# Patient Record
Sex: Female | Born: 1975
Health system: Southern US, Community
[De-identification: ages and names within clinical notes are randomized; demographics above are authoritative.]

## PROBLEM LIST (undated history)

## (undated) ENCOUNTER — Inpatient Hospital Stay (HOSPITAL_COMMUNITY): Payer: Self-pay

## (undated) DIAGNOSIS — F329 Major depressive disorder, single episode, unspecified: Secondary | ICD-10-CM

## (undated) DIAGNOSIS — E119 Type 2 diabetes mellitus without complications: Secondary | ICD-10-CM

## (undated) DIAGNOSIS — O34219 Maternal care for unspecified type scar from previous cesarean delivery: Secondary | ICD-10-CM

## (undated) DIAGNOSIS — F32A Depression, unspecified: Secondary | ICD-10-CM

## (undated) DIAGNOSIS — R0602 Shortness of breath: Secondary | ICD-10-CM

## (undated) DIAGNOSIS — E7800 Pure hypercholesterolemia, unspecified: Secondary | ICD-10-CM

## (undated) DIAGNOSIS — M1990 Unspecified osteoarthritis, unspecified site: Secondary | ICD-10-CM

## (undated) DIAGNOSIS — N946 Dysmenorrhea, unspecified: Secondary | ICD-10-CM

## (undated) DIAGNOSIS — F419 Anxiety disorder, unspecified: Secondary | ICD-10-CM

## (undated) DIAGNOSIS — N912 Amenorrhea, unspecified: Secondary | ICD-10-CM

## (undated) DIAGNOSIS — G4730 Sleep apnea, unspecified: Secondary | ICD-10-CM

## (undated) DIAGNOSIS — M549 Dorsalgia, unspecified: Secondary | ICD-10-CM

## (undated) DIAGNOSIS — K200 Eosinophilic esophagitis: Secondary | ICD-10-CM

## (undated) DIAGNOSIS — D649 Anemia, unspecified: Secondary | ICD-10-CM

## (undated) DIAGNOSIS — D72829 Elevated white blood cell count, unspecified: Secondary | ICD-10-CM

## (undated) DIAGNOSIS — I499 Cardiac arrhythmia, unspecified: Secondary | ICD-10-CM

## (undated) DIAGNOSIS — A419 Sepsis, unspecified organism: Secondary | ICD-10-CM

## (undated) DIAGNOSIS — K219 Gastro-esophageal reflux disease without esophagitis: Secondary | ICD-10-CM

## (undated) DIAGNOSIS — G709 Myoneural disorder, unspecified: Secondary | ICD-10-CM

## (undated) DIAGNOSIS — K5900 Constipation, unspecified: Secondary | ICD-10-CM

## (undated) DIAGNOSIS — K589 Irritable bowel syndrome without diarrhea: Secondary | ICD-10-CM

## (undated) DIAGNOSIS — R600 Localized edema: Secondary | ICD-10-CM

## (undated) DIAGNOSIS — T7840XA Allergy, unspecified, initial encounter: Secondary | ICD-10-CM

## (undated) DIAGNOSIS — K829 Disease of gallbladder, unspecified: Secondary | ICD-10-CM

## (undated) DIAGNOSIS — O009 Unspecified ectopic pregnancy without intrauterine pregnancy: Secondary | ICD-10-CM

## (undated) DIAGNOSIS — T884XXA Failed or difficult intubation, initial encounter: Secondary | ICD-10-CM

## (undated) DIAGNOSIS — F250 Schizoaffective disorder, bipolar type: Secondary | ICD-10-CM

## (undated) DIAGNOSIS — J449 Chronic obstructive pulmonary disease, unspecified: Secondary | ICD-10-CM

## (undated) DIAGNOSIS — E669 Obesity, unspecified: Secondary | ICD-10-CM

## (undated) HISTORY — DX: Localized edema: R60.0

## (undated) HISTORY — DX: Dorsalgia, unspecified: M54.9

## (undated) HISTORY — DX: Eosinophilic esophagitis: K20.0

## (undated) HISTORY — DX: Irritable bowel syndrome, unspecified: K58.9

## (undated) HISTORY — DX: Disease of gallbladder, unspecified: K82.9

## (undated) HISTORY — DX: Elevated white blood cell count, unspecified: D72.829

## (undated) HISTORY — DX: Unspecified osteoarthritis, unspecified site: M19.90

## (undated) HISTORY — PX: UPPER GASTROINTESTINAL ENDOSCOPY: SHX188

## (undated) HISTORY — DX: Failed or difficult intubation, initial encounter: T88.4XXA

## (undated) HISTORY — DX: Maternal care for unspecified type scar from previous cesarean delivery: O34.219

## (undated) HISTORY — PX: CHOLECYSTECTOMY: SHX55

## (undated) HISTORY — DX: Obesity, unspecified: E66.9

## (undated) HISTORY — PX: TONSILLECTOMY: SUR1361

## (undated) HISTORY — DX: Allergy, unspecified, initial encounter: T78.40XA

## (undated) HISTORY — DX: Constipation, unspecified: K59.00

## (undated) HISTORY — DX: Anxiety disorder, unspecified: F41.9

## (undated) HISTORY — PX: DENTAL SURGERY: SHX609

## (undated) HISTORY — DX: Anemia, unspecified: D64.9

## (undated) HISTORY — DX: Pure hypercholesterolemia, unspecified: E78.00

## (undated) HISTORY — DX: Unspecified ectopic pregnancy without intrauterine pregnancy: O00.90

---

## 2003-07-21 ENCOUNTER — Encounter: Payer: Self-pay | Admitting: Emergency Medicine

## 2003-07-22 ENCOUNTER — Emergency Department (HOSPITAL_COMMUNITY): Admission: EM | Admit: 2003-07-22 | Discharge: 2003-07-22 | Payer: Self-pay | Admitting: Emergency Medicine

## 2003-10-17 ENCOUNTER — Ambulatory Visit (HOSPITAL_COMMUNITY): Admission: RE | Admit: 2003-10-17 | Discharge: 2003-10-17 | Payer: Self-pay | Admitting: General Surgery

## 2004-01-10 ENCOUNTER — Ambulatory Visit (HOSPITAL_COMMUNITY): Admission: RE | Admit: 2004-01-10 | Discharge: 2004-01-10 | Payer: Self-pay | Admitting: General Surgery

## 2004-02-06 ENCOUNTER — Inpatient Hospital Stay: Payer: Self-pay | Admitting: Unknown Physician Specialty

## 2004-02-18 ENCOUNTER — Ambulatory Visit: Admission: RE | Admit: 2004-02-18 | Discharge: 2004-02-18 | Payer: Self-pay | Admitting: General Surgery

## 2004-02-18 ENCOUNTER — Ambulatory Visit: Payer: Self-pay | Admitting: Pulmonary Disease

## 2004-04-30 ENCOUNTER — Emergency Department (HOSPITAL_COMMUNITY): Admission: EM | Admit: 2004-04-30 | Discharge: 2004-04-30 | Payer: Self-pay | Admitting: Emergency Medicine

## 2004-06-02 ENCOUNTER — Emergency Department (HOSPITAL_COMMUNITY): Admission: EM | Admit: 2004-06-02 | Discharge: 2004-06-02 | Payer: Self-pay | Admitting: Emergency Medicine

## 2004-08-09 ENCOUNTER — Ambulatory Visit: Payer: Self-pay | Admitting: Family Medicine

## 2004-08-16 ENCOUNTER — Ambulatory Visit (HOSPITAL_COMMUNITY): Admission: RE | Admit: 2004-08-16 | Discharge: 2004-08-16 | Payer: Self-pay | Admitting: Family Medicine

## 2004-09-10 ENCOUNTER — Ambulatory Visit: Payer: Self-pay | Admitting: Family Medicine

## 2004-11-02 ENCOUNTER — Ambulatory Visit: Payer: Self-pay | Admitting: Psychiatry

## 2004-11-02 ENCOUNTER — Inpatient Hospital Stay (HOSPITAL_COMMUNITY): Admission: RE | Admit: 2004-11-02 | Discharge: 2004-11-05 | Payer: Self-pay | Admitting: Psychiatry

## 2004-12-29 ENCOUNTER — Emergency Department (HOSPITAL_COMMUNITY): Admission: EM | Admit: 2004-12-29 | Discharge: 2004-12-29 | Payer: Self-pay | Admitting: Emergency Medicine

## 2005-01-04 ENCOUNTER — Emergency Department (HOSPITAL_COMMUNITY): Admission: EM | Admit: 2005-01-04 | Discharge: 2005-01-04 | Payer: Self-pay | Admitting: Emergency Medicine

## 2005-08-06 HISTORY — PX: ESOPHAGOGASTRODUODENOSCOPY: SHX1529

## 2005-08-08 ENCOUNTER — Ambulatory Visit: Payer: Self-pay | Admitting: Internal Medicine

## 2005-08-09 ENCOUNTER — Ambulatory Visit (HOSPITAL_COMMUNITY): Admission: RE | Admit: 2005-08-09 | Discharge: 2005-08-09 | Payer: Self-pay | Admitting: Internal Medicine

## 2005-08-16 ENCOUNTER — Ambulatory Visit: Payer: Self-pay | Admitting: Internal Medicine

## 2005-08-16 ENCOUNTER — Ambulatory Visit (HOSPITAL_COMMUNITY): Admission: RE | Admit: 2005-08-16 | Discharge: 2005-08-16 | Payer: Self-pay | Admitting: Internal Medicine

## 2005-08-30 ENCOUNTER — Ambulatory Visit: Payer: Self-pay | Admitting: Internal Medicine

## 2005-11-06 ENCOUNTER — Encounter (INDEPENDENT_AMBULATORY_CARE_PROVIDER_SITE_OTHER): Payer: Self-pay | Admitting: Family Medicine

## 2005-11-06 ENCOUNTER — Emergency Department (HOSPITAL_COMMUNITY): Admission: EM | Admit: 2005-11-06 | Discharge: 2005-11-06 | Payer: Self-pay | Admitting: Emergency Medicine

## 2005-11-06 LAB — CONVERTED CEMR LAB: Pap Smear: NORMAL

## 2006-02-20 ENCOUNTER — Emergency Department (HOSPITAL_COMMUNITY): Admission: EM | Admit: 2006-02-20 | Discharge: 2006-02-20 | Payer: Self-pay | Admitting: Emergency Medicine

## 2006-04-18 ENCOUNTER — Emergency Department (HOSPITAL_COMMUNITY): Admission: EM | Admit: 2006-04-18 | Discharge: 2006-04-19 | Payer: Self-pay | Admitting: Emergency Medicine

## 2006-04-28 ENCOUNTER — Ambulatory Visit: Payer: Self-pay | Admitting: Family Medicine

## 2006-05-06 ENCOUNTER — Encounter (INDEPENDENT_AMBULATORY_CARE_PROVIDER_SITE_OTHER): Payer: Self-pay | Admitting: Family Medicine

## 2006-05-06 LAB — CONVERTED CEMR LAB
ALT: 32 units/L (ref 0–35)
AST: 23 units/L (ref 0–37)
Albumin: 4 g/dL (ref 3.5–5.2)
Alkaline Phosphatase: 61 units/L (ref 39–117)
BUN: 6 mg/dL (ref 6–23)
Basophils Absolute: 0 10*3/uL (ref 0.0–0.1)
Basophils Relative: 0 % (ref 0–1)
CO2: 22 meq/L (ref 19–32)
Calcium: 9.4 mg/dL (ref 8.4–10.5)
Chloride: 109 meq/L (ref 96–112)
Cholesterol: 118 mg/dL (ref 0–200)
Creatinine, Ser: 0.67 mg/dL (ref 0.40–1.20)
Eosinophils Absolute: 0.5 10*3/uL (ref 0.0–0.7)
Eosinophils Relative: 5 % (ref 0–5)
Glucose, Bld: 94 mg/dL (ref 70–99)
HCT: 43.7 % (ref 36.0–46.0)
HDL: 29 mg/dL — ABNORMAL LOW (ref >39)
Hemoglobin: 13.4 g/dL (ref 12.0–15.0)
LDL Cholesterol: 49 mg/dL (ref 0–99)
Lithium Lvl: 0.53 meq/L — ABNORMAL LOW (ref 0.80–1.40)
Lymphocytes Relative: 28 % (ref 12–46)
Lymphs Abs: 2.7 10*3/uL (ref 0.7–3.3)
MCHC: 30.7 g/dL (ref 30.0–36.0)
MCV: 88.6 fL (ref 78.0–100.0)
Monocytes Absolute: 0.6 10*3/uL (ref 0.2–0.7)
Monocytes Relative: 6 % (ref 3–11)
Neutro Abs: 6 10*3/uL (ref 1.7–7.7)
Neutrophils Relative %: 61 % (ref 43–77)
Platelets: 339 10*3/uL (ref 150–400)
Potassium: 4.4 meq/L (ref 3.5–5.3)
RBC: 4.93 M/uL (ref 3.87–5.11)
RDW: 14.9 % — ABNORMAL HIGH (ref 11.5–14.0)
Sodium: 141 meq/L (ref 135–145)
TSH: 2.567 microintl units/mL (ref 0.350–5.50)
Total Bilirubin: 0.3 mg/dL (ref 0.3–1.2)
Total CHOL/HDL Ratio: 4.1
Total Protein: 7.2 g/dL (ref 6.0–8.3)
Triglycerides: 199 mg/dL — ABNORMAL HIGH (ref <150)
VLDL: 40 mg/dL (ref 0–40)
WBC: 9.8 10*3/uL (ref 4.0–10.5)

## 2006-05-12 ENCOUNTER — Encounter: Payer: Self-pay | Admitting: Family Medicine

## 2006-05-12 DIAGNOSIS — I10 Essential (primary) hypertension: Secondary | ICD-10-CM

## 2006-05-12 DIAGNOSIS — E785 Hyperlipidemia, unspecified: Secondary | ICD-10-CM | POA: Insufficient documentation

## 2006-05-12 DIAGNOSIS — K219 Gastro-esophageal reflux disease without esophagitis: Secondary | ICD-10-CM | POA: Insufficient documentation

## 2006-05-12 DIAGNOSIS — J45909 Unspecified asthma, uncomplicated: Secondary | ICD-10-CM | POA: Insufficient documentation

## 2006-05-12 DIAGNOSIS — M1990 Unspecified osteoarthritis, unspecified site: Secondary | ICD-10-CM | POA: Insufficient documentation

## 2006-05-13 ENCOUNTER — Ambulatory Visit: Payer: Self-pay | Admitting: Family Medicine

## 2006-05-13 DIAGNOSIS — K644 Residual hemorrhoidal skin tags: Secondary | ICD-10-CM | POA: Insufficient documentation

## 2006-05-13 DIAGNOSIS — E8881 Metabolic syndrome: Secondary | ICD-10-CM

## 2006-05-13 LAB — CONVERTED CEMR LAB
Cholesterol, target level: 200 mg/dL
HDL goal, serum: 40 mg/dL
LDL Goal: 130 mg/dL

## 2006-05-14 ENCOUNTER — Telehealth (INDEPENDENT_AMBULATORY_CARE_PROVIDER_SITE_OTHER): Payer: Self-pay | Admitting: Family Medicine

## 2006-06-02 ENCOUNTER — Encounter (INDEPENDENT_AMBULATORY_CARE_PROVIDER_SITE_OTHER): Payer: Self-pay | Admitting: Family Medicine

## 2006-06-02 ENCOUNTER — Ambulatory Visit: Admission: RE | Admit: 2006-06-02 | Discharge: 2006-06-02 | Payer: Self-pay | Admitting: Family Medicine

## 2006-06-10 ENCOUNTER — Ambulatory Visit: Payer: Self-pay | Admitting: Family Medicine

## 2006-06-10 ENCOUNTER — Telehealth (INDEPENDENT_AMBULATORY_CARE_PROVIDER_SITE_OTHER): Payer: Self-pay | Admitting: Family Medicine

## 2006-06-10 LAB — CONVERTED CEMR LAB: Hgb A1c MFr Bld: 4.9 %

## 2006-06-11 ENCOUNTER — Ambulatory Visit (HOSPITAL_COMMUNITY): Admission: RE | Admit: 2006-06-11 | Discharge: 2006-06-11 | Payer: Self-pay | Admitting: Family Medicine

## 2006-06-11 ENCOUNTER — Encounter (INDEPENDENT_AMBULATORY_CARE_PROVIDER_SITE_OTHER): Payer: Self-pay | Admitting: Family Medicine

## 2006-06-11 LAB — CONVERTED CEMR LAB: Ferritin: 43 ng/mL (ref 10–291)

## 2006-06-12 ENCOUNTER — Ambulatory Visit: Payer: Self-pay | Admitting: Pulmonary Disease

## 2006-06-13 ENCOUNTER — Encounter (INDEPENDENT_AMBULATORY_CARE_PROVIDER_SITE_OTHER): Payer: Self-pay | Admitting: Family Medicine

## 2006-06-16 ENCOUNTER — Encounter (INDEPENDENT_AMBULATORY_CARE_PROVIDER_SITE_OTHER): Payer: Self-pay | Admitting: Family Medicine

## 2006-06-19 ENCOUNTER — Telehealth (INDEPENDENT_AMBULATORY_CARE_PROVIDER_SITE_OTHER): Payer: Self-pay | Admitting: Family Medicine

## 2006-06-24 ENCOUNTER — Ambulatory Visit: Payer: Self-pay | Admitting: Family Medicine

## 2006-06-25 ENCOUNTER — Encounter (INDEPENDENT_AMBULATORY_CARE_PROVIDER_SITE_OTHER): Payer: Self-pay | Admitting: Family Medicine

## 2006-06-26 ENCOUNTER — Encounter (INDEPENDENT_AMBULATORY_CARE_PROVIDER_SITE_OTHER): Payer: Self-pay | Admitting: Family Medicine

## 2006-07-10 ENCOUNTER — Encounter (INDEPENDENT_AMBULATORY_CARE_PROVIDER_SITE_OTHER): Payer: Self-pay | Admitting: Family Medicine

## 2006-07-15 ENCOUNTER — Encounter (INDEPENDENT_AMBULATORY_CARE_PROVIDER_SITE_OTHER): Payer: Self-pay | Admitting: Family Medicine

## 2006-08-11 ENCOUNTER — Encounter (INDEPENDENT_AMBULATORY_CARE_PROVIDER_SITE_OTHER): Payer: Self-pay | Admitting: Family Medicine

## 2006-08-13 ENCOUNTER — Telehealth (INDEPENDENT_AMBULATORY_CARE_PROVIDER_SITE_OTHER): Payer: Self-pay | Admitting: Family Medicine

## 2006-08-26 ENCOUNTER — Encounter (INDEPENDENT_AMBULATORY_CARE_PROVIDER_SITE_OTHER): Payer: Self-pay | Admitting: Family Medicine

## 2006-09-16 ENCOUNTER — Ambulatory Visit: Payer: Self-pay | Admitting: Family Medicine

## 2006-09-17 ENCOUNTER — Encounter (INDEPENDENT_AMBULATORY_CARE_PROVIDER_SITE_OTHER): Payer: Self-pay | Admitting: Family Medicine

## 2006-09-25 ENCOUNTER — Encounter (INDEPENDENT_AMBULATORY_CARE_PROVIDER_SITE_OTHER): Payer: Self-pay | Admitting: Family Medicine

## 2006-09-25 ENCOUNTER — Ambulatory Visit (HOSPITAL_COMMUNITY): Admission: RE | Admit: 2006-09-25 | Discharge: 2006-09-25 | Payer: Self-pay | Admitting: Family Medicine

## 2006-10-14 ENCOUNTER — Ambulatory Visit: Payer: Self-pay | Admitting: Family Medicine

## 2006-10-14 ENCOUNTER — Telehealth (INDEPENDENT_AMBULATORY_CARE_PROVIDER_SITE_OTHER): Payer: Self-pay | Admitting: *Deleted

## 2006-10-15 ENCOUNTER — Encounter (INDEPENDENT_AMBULATORY_CARE_PROVIDER_SITE_OTHER): Payer: Self-pay | Admitting: Family Medicine

## 2006-10-28 ENCOUNTER — Emergency Department (HOSPITAL_COMMUNITY): Admission: EM | Admit: 2006-10-28 | Discharge: 2006-10-28 | Payer: Self-pay | Admitting: Emergency Medicine

## 2006-10-28 ENCOUNTER — Telehealth (INDEPENDENT_AMBULATORY_CARE_PROVIDER_SITE_OTHER): Payer: Self-pay | Admitting: Family Medicine

## 2006-10-30 ENCOUNTER — Encounter (INDEPENDENT_AMBULATORY_CARE_PROVIDER_SITE_OTHER): Payer: Self-pay | Admitting: Family Medicine

## 2006-11-07 ENCOUNTER — Encounter (INDEPENDENT_AMBULATORY_CARE_PROVIDER_SITE_OTHER): Payer: Self-pay | Admitting: Family Medicine

## 2006-11-07 LAB — CONVERTED CEMR LAB: Pap Smear: NORMAL

## 2006-11-25 ENCOUNTER — Ambulatory Visit: Payer: Self-pay | Admitting: Family Medicine

## 2006-12-10 ENCOUNTER — Ambulatory Visit: Payer: Self-pay | Admitting: Family Medicine

## 2007-01-07 ENCOUNTER — Ambulatory Visit: Payer: Self-pay | Admitting: Family Medicine

## 2007-02-18 ENCOUNTER — Ambulatory Visit: Payer: Self-pay | Admitting: Family Medicine

## 2007-02-18 ENCOUNTER — Telehealth (INDEPENDENT_AMBULATORY_CARE_PROVIDER_SITE_OTHER): Payer: Self-pay | Admitting: *Deleted

## 2007-02-24 ENCOUNTER — Emergency Department (HOSPITAL_COMMUNITY): Admission: EM | Admit: 2007-02-24 | Discharge: 2007-02-24 | Payer: Self-pay | Admitting: Emergency Medicine

## 2007-04-15 ENCOUNTER — Ambulatory Visit: Payer: Self-pay | Admitting: Family Medicine

## 2007-05-27 ENCOUNTER — Ambulatory Visit: Payer: Self-pay | Admitting: Family Medicine

## 2007-06-24 ENCOUNTER — Emergency Department (HOSPITAL_COMMUNITY): Admission: EM | Admit: 2007-06-24 | Discharge: 2007-06-24 | Payer: Self-pay | Admitting: Emergency Medicine

## 2007-07-08 ENCOUNTER — Ambulatory Visit: Payer: Self-pay | Admitting: Family Medicine

## 2007-07-09 ENCOUNTER — Telehealth (INDEPENDENT_AMBULATORY_CARE_PROVIDER_SITE_OTHER): Payer: Self-pay | Admitting: *Deleted

## 2007-07-09 LAB — CONVERTED CEMR LAB
ALT: 31 units/L (ref 0–35)
AST: 27 units/L (ref 0–37)
Albumin: 4.4 g/dL (ref 3.5–5.2)
Alkaline Phosphatase: 68 units/L (ref 39–117)
BUN: 7 mg/dL (ref 6–23)
Basophils Absolute: 0 10*3/uL (ref 0.0–0.1)
Basophils Relative: 0 % (ref 0–1)
CO2: 20 meq/L (ref 19–32)
Calcium: 9.4 mg/dL (ref 8.4–10.5)
Chloride: 106 meq/L (ref 96–112)
Cholesterol: 131 mg/dL (ref 0–200)
Creatinine, Ser: 0.57 mg/dL (ref 0.40–1.20)
Eosinophils Absolute: 0.5 10*3/uL (ref 0.0–0.7)
Eosinophils Relative: 4 % (ref 0–5)
Glucose, Bld: 80 mg/dL (ref 70–99)
HCT: 45.3 % (ref 36.0–46.0)
HDL: 33 mg/dL — ABNORMAL LOW (ref >39)
Hemoglobin: 14.2 g/dL (ref 12.0–15.0)
LDL Cholesterol: 51 mg/dL (ref 0–99)
Lymphocytes Relative: 30 % (ref 12–46)
Lymphs Abs: 3.4 10*3/uL (ref 0.7–4.0)
MCHC: 31.3 g/dL (ref 30.0–36.0)
MCV: 89.9 fL (ref 78.0–100.0)
Monocytes Absolute: 0.5 10*3/uL (ref 0.1–1.0)
Monocytes Relative: 4 % (ref 3–12)
Neutro Abs: 6.9 10*3/uL (ref 1.7–7.7)
Neutrophils Relative %: 61 % (ref 43–77)
Platelets: 395 10*3/uL (ref 150–400)
Potassium: 5.1 meq/L (ref 3.5–5.3)
RBC: 5.04 M/uL (ref 3.87–5.11)
RDW: 14.6 % (ref 11.5–15.5)
Sodium: 140 meq/L (ref 135–145)
TSH: 3.56 microintl units/mL (ref 0.350–5.50)
Total Bilirubin: 0.3 mg/dL (ref 0.3–1.2)
Total CHOL/HDL Ratio: 4
Total Protein: 7.8 g/dL (ref 6.0–8.3)
Triglycerides: 236 mg/dL — ABNORMAL HIGH (ref <150)
VLDL: 47 mg/dL — ABNORMAL HIGH (ref 0–40)
WBC: 11.3 10*3/uL — ABNORMAL HIGH (ref 4.0–10.5)

## 2007-08-20 ENCOUNTER — Ambulatory Visit: Payer: Self-pay | Admitting: Family Medicine

## 2007-08-20 DIAGNOSIS — J301 Allergic rhinitis due to pollen: Secondary | ICD-10-CM | POA: Insufficient documentation

## 2007-10-01 ENCOUNTER — Ambulatory Visit: Payer: Self-pay | Admitting: Family Medicine

## 2007-11-11 ENCOUNTER — Ambulatory Visit: Payer: Self-pay | Admitting: Family Medicine

## 2007-12-10 ENCOUNTER — Ambulatory Visit: Payer: Self-pay | Admitting: Family Medicine

## 2008-01-21 ENCOUNTER — Ambulatory Visit: Payer: Self-pay | Admitting: Family Medicine

## 2008-01-22 LAB — CONVERTED CEMR LAB
ALT: 25 units/L (ref 0–35)
AST: 18 units/L (ref 0–37)
Albumin: 5.1 g/dL (ref 3.5–5.2)
Alkaline Phosphatase: 72 units/L (ref 39–117)
BUN: 13 mg/dL (ref 6–23)
CO2: 19 meq/L (ref 19–32)
Calcium: 10.5 mg/dL (ref 8.4–10.5)
Chloride: 102 meq/L (ref 96–112)
Cholesterol: 164 mg/dL (ref 0–200)
Creatinine, Ser: 0.67 mg/dL (ref 0.40–1.20)
Glucose, Bld: 74 mg/dL (ref 70–99)
HDL: 40 mg/dL (ref >39)
LDL Cholesterol: 51 mg/dL (ref 0–99)
Potassium: 4.6 meq/L (ref 3.5–5.3)
Sodium: 137 meq/L (ref 135–145)
Total Bilirubin: 0.3 mg/dL (ref 0.3–1.2)
Total CHOL/HDL Ratio: 4.1
Total Protein: 9.1 g/dL — ABNORMAL HIGH (ref 6.0–8.3)
Triglycerides: 367 mg/dL — ABNORMAL HIGH (ref <150)
VLDL: 73 mg/dL — ABNORMAL HIGH (ref 0–40)

## 2008-03-08 ENCOUNTER — Ambulatory Visit: Payer: Self-pay | Admitting: Family Medicine

## 2008-03-08 LAB — CONVERTED CEMR LAB

## 2008-03-09 ENCOUNTER — Encounter (INDEPENDENT_AMBULATORY_CARE_PROVIDER_SITE_OTHER): Payer: Self-pay | Admitting: Family Medicine

## 2008-04-19 ENCOUNTER — Ambulatory Visit: Payer: Self-pay | Admitting: Family Medicine

## 2008-04-19 LAB — CONVERTED CEMR LAB
Bilirubin Urine: NEGATIVE
Glucose, Urine, Semiquant: NEGATIVE
Ketones, urine, test strip: NEGATIVE
Nitrite: NEGATIVE
Protein, U semiquant: 100
Specific Gravity, Urine: 1.02
Urobilinogen, UA: 0.2
pH: 6

## 2008-04-20 ENCOUNTER — Encounter (INDEPENDENT_AMBULATORY_CARE_PROVIDER_SITE_OTHER): Payer: Self-pay | Admitting: Family Medicine

## 2008-04-20 LAB — CONVERTED CEMR LAB
ALT: 24 U/L
AST: 20 U/L
Albumin: 4.4 g/dL
Alkaline Phosphatase: 60 U/L
BUN: 8 mg/dL
CO2: 18 meq/L — ABNORMAL LOW
Calcium: 9.6 mg/dL
Chloride: 102 meq/L
Creatinine, Ser: 0.67 mg/dL
Glucose, Bld: 74 mg/dL
Potassium: 4.4 meq/L
Sodium: 138 meq/L
Total Bilirubin: 0.5 mg/dL
Total Protein: 8.2 g/dL

## 2008-05-17 ENCOUNTER — Ambulatory Visit: Payer: Self-pay | Admitting: Family Medicine

## 2008-06-02 ENCOUNTER — Ambulatory Visit: Payer: Self-pay | Admitting: Family Medicine

## 2008-06-02 LAB — CONVERTED CEMR LAB
LDL Goal: 160 mg/dL
Rapid Strep: NEGATIVE

## 2008-06-30 ENCOUNTER — Ambulatory Visit: Payer: Self-pay | Admitting: Family Medicine

## 2008-07-01 LAB — CONVERTED CEMR LAB
ALT: 35 units/L (ref 0–35)
AST: 28 units/L (ref 0–37)
Albumin: 4.5 g/dL (ref 3.5–5.2)
Alkaline Phosphatase: 59 units/L (ref 39–117)
BUN: 7 mg/dL (ref 6–23)
Basophils Absolute: 0.1 10*3/uL (ref 0.0–0.1)
Basophils Relative: 0 % (ref 0–1)
CO2: 22 meq/L (ref 19–32)
Calcium: 9.4 mg/dL (ref 8.4–10.5)
Chloride: 104 meq/L (ref 96–112)
Cholesterol: 142 mg/dL (ref 0–200)
Creatinine, Ser: 0.63 mg/dL (ref 0.40–1.20)
Eosinophils Absolute: 0.5 10*3/uL (ref 0.0–0.7)
Eosinophils Relative: 4 % (ref 0–5)
Glucose, Bld: 84 mg/dL (ref 70–99)
HCT: 44.4 % (ref 36.0–46.0)
HDL: 36 mg/dL — ABNORMAL LOW (ref >39)
Hemoglobin: 13.8 g/dL (ref 12.0–15.0)
LDL Cholesterol: 54 mg/dL (ref 0–99)
Lithium Lvl: 0.47 meq/L — ABNORMAL LOW (ref 0.80–1.40)
Lymphocytes Relative: 28 % (ref 12–46)
Lymphs Abs: 3.7 10*3/uL (ref 0.7–4.0)
MCHC: 31.1 g/dL (ref 30.0–36.0)
MCV: 87.4 fL (ref 78.0–100.0)
Monocytes Absolute: 0.6 10*3/uL (ref 0.1–1.0)
Monocytes Relative: 4 % (ref 3–12)
Neutro Abs: 8.4 10*3/uL — ABNORMAL HIGH (ref 1.7–7.7)
Neutrophils Relative %: 64 % (ref 43–77)
Platelets: 421 10*3/uL — ABNORMAL HIGH (ref 150–400)
Potassium: 4.5 meq/L (ref 3.5–5.3)
RBC: 5.08 M/uL (ref 3.87–5.11)
RDW: 14.8 % (ref 11.5–15.5)
Sodium: 140 meq/L (ref 135–145)
TSH: 2.328 microintl units/mL (ref 0.350–4.500)
Total Bilirubin: 0.5 mg/dL (ref 0.3–1.2)
Total CHOL/HDL Ratio: 3.9
Total Protein: 8.2 g/dL (ref 6.0–8.3)
Triglycerides: 259 mg/dL — ABNORMAL HIGH (ref <150)
VLDL: 52 mg/dL — ABNORMAL HIGH (ref 0–40)
WBC: 13.2 10*3/uL — ABNORMAL HIGH (ref 4.0–10.5)

## 2008-07-04 ENCOUNTER — Encounter (INDEPENDENT_AMBULATORY_CARE_PROVIDER_SITE_OTHER): Payer: Self-pay | Admitting: Family Medicine

## 2008-07-28 ENCOUNTER — Ambulatory Visit: Payer: Self-pay | Admitting: Family Medicine

## 2008-07-28 DIAGNOSIS — D72829 Elevated white blood cell count, unspecified: Secondary | ICD-10-CM

## 2008-07-28 HISTORY — DX: Elevated white blood cell count, unspecified: D72.829

## 2008-07-28 LAB — CONVERTED CEMR LAB: LDL Goal: 130 mg/dL

## 2008-08-25 ENCOUNTER — Ambulatory Visit: Payer: Self-pay | Admitting: Family Medicine

## 2008-08-25 DIAGNOSIS — F17200 Nicotine dependence, unspecified, uncomplicated: Secondary | ICD-10-CM | POA: Insufficient documentation

## 2008-08-25 LAB — CONVERTED CEMR LAB

## 2008-08-31 ENCOUNTER — Telehealth (INDEPENDENT_AMBULATORY_CARE_PROVIDER_SITE_OTHER): Payer: Self-pay | Admitting: *Deleted

## 2008-10-06 ENCOUNTER — Ambulatory Visit: Payer: Self-pay | Admitting: Family Medicine

## 2008-10-06 ENCOUNTER — Ambulatory Visit (HOSPITAL_COMMUNITY): Admission: RE | Admit: 2008-10-06 | Discharge: 2008-10-06 | Payer: Self-pay | Admitting: Family Medicine

## 2008-10-07 ENCOUNTER — Telehealth (INDEPENDENT_AMBULATORY_CARE_PROVIDER_SITE_OTHER): Payer: Self-pay | Admitting: *Deleted

## 2008-10-19 ENCOUNTER — Ambulatory Visit: Payer: Self-pay | Admitting: Family Medicine

## 2008-10-19 DIAGNOSIS — F319 Bipolar disorder, unspecified: Secondary | ICD-10-CM | POA: Insufficient documentation

## 2008-10-21 ENCOUNTER — Emergency Department (HOSPITAL_COMMUNITY): Admission: EM | Admit: 2008-10-21 | Discharge: 2008-10-21 | Payer: Self-pay | Admitting: Emergency Medicine

## 2008-11-17 ENCOUNTER — Ambulatory Visit: Payer: Self-pay | Admitting: Family Medicine

## 2008-12-15 ENCOUNTER — Ambulatory Visit: Payer: Self-pay | Admitting: Family Medicine

## 2009-01-03 ENCOUNTER — Encounter (INDEPENDENT_AMBULATORY_CARE_PROVIDER_SITE_OTHER): Payer: Self-pay | Admitting: Family Medicine

## 2009-10-31 ENCOUNTER — Encounter: Payer: Self-pay | Admitting: *Deleted

## 2009-11-01 ENCOUNTER — Ambulatory Visit: Payer: Self-pay | Admitting: Cardiovascular Disease

## 2009-11-01 DIAGNOSIS — R002 Palpitations: Secondary | ICD-10-CM | POA: Insufficient documentation

## 2009-11-02 ENCOUNTER — Encounter: Payer: Self-pay | Admitting: Cardiovascular Disease

## 2009-11-15 ENCOUNTER — Telehealth (INDEPENDENT_AMBULATORY_CARE_PROVIDER_SITE_OTHER): Payer: Self-pay | Admitting: *Deleted

## 2009-12-21 ENCOUNTER — Other Ambulatory Visit: Admission: RE | Admit: 2009-12-21 | Discharge: 2009-12-21 | Payer: Self-pay | Admitting: Obstetrics & Gynecology

## 2010-05-08 NOTE — Letter (Signed)
Summary: ekg 10/19/09  ekg 10/19/09   Imported By: Faythe Ghee 11/02/2009 11:56:47  _____________________________________________________________________  External Attachment:    Type:   Image     Comment:   External Document
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
## 2010-05-08 NOTE — Progress Notes (Signed)
  Phone Note From Other Clinic   Caller: [facility] Initial call taken by: KM    LOV,12 lead faxed to [phone] [facility]  November 15, 2009 8:57 AM

## 2010-05-08 NOTE — Letter (Signed)
Summary: [facility] NOTE 10/19/09  [facility] NOTE 10/19/09   Imported By: Faythe Ghee 11/02/2009 10:55:16  _____________________________________________________________________  External Attachment:    Type:   Image     Comment:   External Document

## 2010-07-26 ENCOUNTER — Emergency Department (HOSPITAL_COMMUNITY): Payer: Medicare Other

## 2010-07-26 ENCOUNTER — Emergency Department (HOSPITAL_COMMUNITY)
Admission: EM | Admit: 2010-07-26 | Discharge: 2010-07-26 | Disposition: A | Discharge status: Home / Self Care | Payer: Medicare Other | Attending: Emergency Medicine | Admitting: Emergency Medicine

## 2010-07-26 DIAGNOSIS — J4489 Other specified chronic obstructive pulmonary disease: Secondary | ICD-10-CM | POA: Insufficient documentation

## 2010-07-26 DIAGNOSIS — Y9239 Other specified sports and athletic area as the place of occurrence of the external cause: Secondary | ICD-10-CM | POA: Insufficient documentation

## 2010-07-26 DIAGNOSIS — Y998 Other external cause status: Secondary | ICD-10-CM | POA: Insufficient documentation

## 2010-07-26 DIAGNOSIS — I10 Essential (primary) hypertension: Secondary | ICD-10-CM | POA: Insufficient documentation

## 2010-07-26 DIAGNOSIS — X500XXA Overexertion from strenuous movement or load, initial encounter: Secondary | ICD-10-CM | POA: Insufficient documentation

## 2010-07-26 DIAGNOSIS — Z79899 Other long term (current) drug therapy: Secondary | ICD-10-CM | POA: Insufficient documentation

## 2010-07-26 DIAGNOSIS — F319 Bipolar disorder, unspecified: Secondary | ICD-10-CM | POA: Insufficient documentation

## 2010-07-26 DIAGNOSIS — J449 Chronic obstructive pulmonary disease, unspecified: Secondary | ICD-10-CM | POA: Insufficient documentation

## 2010-07-26 DIAGNOSIS — E669 Obesity, unspecified: Secondary | ICD-10-CM | POA: Insufficient documentation

## 2010-07-26 DIAGNOSIS — S335XXA Sprain of ligaments of lumbar spine, initial encounter: Secondary | ICD-10-CM | POA: Insufficient documentation

## 2010-07-26 DIAGNOSIS — Y9354 Activity, bowling: Secondary | ICD-10-CM | POA: Insufficient documentation

## 2010-07-26 LAB — GLUCOSE, CAPILLARY: Glucose-Capillary: 103 mg/dL — ABNORMAL HIGH (ref 70–99)

## 2010-07-28 ENCOUNTER — Emergency Department (HOSPITAL_COMMUNITY)
Admission: EM | Admit: 2010-07-28 | Discharge: 2010-07-28 | Discharge status: Against Medical Advice | Payer: Medicare Other | Attending: Emergency Medicine | Admitting: Emergency Medicine

## 2010-07-28 DIAGNOSIS — R0989 Other specified symptoms and signs involving the circulatory and respiratory systems: Secondary | ICD-10-CM | POA: Insufficient documentation

## 2010-07-28 DIAGNOSIS — R05 Cough: Secondary | ICD-10-CM | POA: Insufficient documentation

## 2010-07-28 DIAGNOSIS — M549 Dorsalgia, unspecified: Secondary | ICD-10-CM | POA: Insufficient documentation

## 2010-07-28 DIAGNOSIS — R059 Cough, unspecified: Secondary | ICD-10-CM | POA: Insufficient documentation

## 2010-07-28 DIAGNOSIS — R0609 Other forms of dyspnea: Secondary | ICD-10-CM | POA: Insufficient documentation

## 2010-08-12 ENCOUNTER — Emergency Department (HOSPITAL_COMMUNITY): Payer: Medicare Other

## 2010-08-12 ENCOUNTER — Emergency Department (HOSPITAL_COMMUNITY)
Admission: EM | Admit: 2010-08-12 | Discharge: 2010-08-12 | Disposition: A | Discharge status: Home / Self Care | Payer: Medicare Other | Attending: Emergency Medicine | Admitting: Emergency Medicine

## 2010-08-12 DIAGNOSIS — Z79899 Other long term (current) drug therapy: Secondary | ICD-10-CM | POA: Insufficient documentation

## 2010-08-12 DIAGNOSIS — J449 Chronic obstructive pulmonary disease, unspecified: Secondary | ICD-10-CM | POA: Insufficient documentation

## 2010-08-12 DIAGNOSIS — R1013 Epigastric pain: Secondary | ICD-10-CM | POA: Insufficient documentation

## 2010-08-12 DIAGNOSIS — N912 Amenorrhea, unspecified: Secondary | ICD-10-CM | POA: Insufficient documentation

## 2010-08-12 DIAGNOSIS — J4489 Other specified chronic obstructive pulmonary disease: Secondary | ICD-10-CM | POA: Insufficient documentation

## 2010-08-12 DIAGNOSIS — F3289 Other specified depressive episodes: Secondary | ICD-10-CM | POA: Insufficient documentation

## 2010-08-12 DIAGNOSIS — F329 Major depressive disorder, single episode, unspecified: Secondary | ICD-10-CM | POA: Insufficient documentation

## 2010-08-12 DIAGNOSIS — R110 Nausea: Secondary | ICD-10-CM | POA: Insufficient documentation

## 2010-08-15 ENCOUNTER — Emergency Department (HOSPITAL_COMMUNITY): Payer: Medicare Other

## 2010-08-15 ENCOUNTER — Emergency Department (HOSPITAL_COMMUNITY)
Admission: EM | Admit: 2010-08-15 | Discharge: 2010-08-16 | Disposition: A | Discharge status: Home / Self Care | Payer: Medicare Other | Attending: Emergency Medicine | Admitting: Emergency Medicine

## 2010-08-15 DIAGNOSIS — R319 Hematuria, unspecified: Secondary | ICD-10-CM | POA: Insufficient documentation

## 2010-08-15 DIAGNOSIS — R0609 Other forms of dyspnea: Secondary | ICD-10-CM | POA: Insufficient documentation

## 2010-08-15 DIAGNOSIS — J4489 Other specified chronic obstructive pulmonary disease: Secondary | ICD-10-CM | POA: Insufficient documentation

## 2010-08-15 DIAGNOSIS — R0989 Other specified symptoms and signs involving the circulatory and respiratory systems: Secondary | ICD-10-CM | POA: Insufficient documentation

## 2010-08-15 DIAGNOSIS — F329 Major depressive disorder, single episode, unspecified: Secondary | ICD-10-CM | POA: Insufficient documentation

## 2010-08-15 DIAGNOSIS — R059 Cough, unspecified: Secondary | ICD-10-CM | POA: Insufficient documentation

## 2010-08-15 DIAGNOSIS — F3289 Other specified depressive episodes: Secondary | ICD-10-CM | POA: Insufficient documentation

## 2010-08-15 DIAGNOSIS — J449 Chronic obstructive pulmonary disease, unspecified: Secondary | ICD-10-CM | POA: Insufficient documentation

## 2010-08-15 DIAGNOSIS — R071 Chest pain on breathing: Secondary | ICD-10-CM | POA: Insufficient documentation

## 2010-08-15 DIAGNOSIS — N898 Other specified noninflammatory disorders of vagina: Secondary | ICD-10-CM | POA: Insufficient documentation

## 2010-08-15 DIAGNOSIS — R05 Cough: Secondary | ICD-10-CM | POA: Insufficient documentation

## 2010-08-15 LAB — COMPREHENSIVE METABOLIC PANEL
ALT: 30 U/L (ref 0–35)
AST: 23 U/L (ref 0–37)
Calcium: 9.8 mg/dL (ref 8.4–10.5)
GFR calc Af Amer: >60 mL/min (ref >60)
Sodium: 138 mEq/L (ref 135–145)
Total Protein: 7.1 g/dL (ref 6.0–8.3)

## 2010-08-15 LAB — URINE MICROSCOPIC-ADD ON

## 2010-08-15 LAB — CBC
HCT: 39.6 % (ref 36.0–46.0)
MCV: 88.2 fL (ref 78.0–100.0)
RBC: 4.49 MIL/uL (ref 3.87–5.11)
WBC: 14.3 10*3/uL — ABNORMAL HIGH (ref 4.0–10.5)

## 2010-08-15 LAB — POCT CARDIAC MARKERS: Troponin i, poc: <0.05 ng/mL (ref 0.00–0.09)

## 2010-08-15 LAB — URINALYSIS, ROUTINE W REFLEX MICROSCOPIC
Bilirubin Urine: NEGATIVE
Nitrite: NEGATIVE
Specific Gravity, Urine: 1.025 (ref 1.005–1.030)
pH: 6 (ref 5.0–8.0)

## 2010-08-15 LAB — DIFFERENTIAL
Lymphocytes Relative: 25 % (ref 12–46)
Lymphs Abs: 3.5 10*3/uL (ref 0.7–4.0)
Neutrophils Relative %: 66 % (ref 43–77)

## 2010-08-15 LAB — WET PREP, GENITAL: Clue Cells Wet Prep HPF POC: NONE SEEN

## 2010-08-15 LAB — PREGNANCY, URINE: Preg Test, Ur: NEGATIVE

## 2010-08-17 LAB — GC/CHLAMYDIA PROBE AMP, GENITAL: GC Probe Amp, Genital: NEGATIVE

## 2010-08-21 NOTE — Procedures (Signed)
NAMECAIRO, LINGENFELTER              ACCOUNT NO.:  [id number]   MEDICAL RECORD NO.:  [id number]          PATIENT TYPE:  OUT   LOCATION:  RESP                          FACILITY:  [facility]   PHYSICIAN:  Edward L. Juanetta Gosling, M.D.DATE OF BIRTH:  [birth year]   DATE OF PROCEDURE:  DATE OF DISCHARGE:                            PULMONARY FUNCTION TEST   1. Spirometry is normal with the exception of airflow obstruction at      the level of the smaller airways.  2. Lung volumes show mild reduction of total lung capacity.  3. Carbon monoxide diffusing capacity is mildly reduced.  4. Arterial blood gases are normal.  Noting the patient's height and      weight, the changes seen are consistent with changes due to body      habitus.      Edward L. Juanetta Gosling, M.D.  Electronically Signed     ELH/MEDQ  D:  09/25/2006  T:  09/26/2006  Job:  [id number]   cc:   Franchot Heidelberg, M.D.

## 2010-08-24 NOTE — Op Note (Signed)
Heather [name], Heather [name]              ACCOUNT NO.:  [id number]   MEDICAL RECORD NO.:  [id number]          PATIENT TYPE:  AMB   LOCATION:  DAY                           FACILITY:  [facility]   PHYSICIAN:  R. Roetta Sessions, M.D. DATE OF BIRTH:  April 17, 1975   DATE OF PROCEDURE:  08/16/2005  DATE OF DISCHARGE:                                 OPERATIVE REPORT   PROCEDURE:  Diagnostic esophagogastroduodenoscopy.   INDICATION FOR PROCEDURE:  The patient is a morbidly obese 35 year old  Caucasian female with GERD well-controlled on Aciphex; however, she has  developed a five to six-month history of nonspecific abdominal pain.  Sometimes the GERD is postprandial.  The gallbladder is out.  Ultrasound of  the right upper quadrant recently demonstrated no obvious abnormality,  suboptimal exam because of her body habitus.  Recent labs included normal  LFTS, white count slightly elevated at 14,300, H&H of 13.0, hematocrit 42.0.  The LFTS were okay, amylase and lipase normal at 34 and 45.  A set of stool  studies came back normal.  She has been having some diarrhea.  EGD is now  being done.  This approach has been discussed with her at length, the  potential risks, benefits, and alternatives have been reviewed, questions  answered.  She is agreeable.  Please see documentation in the medical  record.   PROCEDURE NOTE:  O2 saturation, blood pressure, pulse, and respiration were  monitored throughout the entire procedure.   CONSCIOUS SEDATION:  Versed 4 mg IV, Demerol 100 mg IV in divided doses.   INSTRUMENT USED:  Olympus video colonoscope.   FINDINGS:  Examination of the tubular esophagus revealed no mucosal  abnormalities.  The EG junction was easily traversed.   Stomach:  Gastric cavity was empty, insufflated well with air.  A thorough  examination of the gastric mucosa including a retroflexed view of the  proximal stomach and esophagogastric junction demonstrated no abnormalities.  The pylorus was  patent and easily traversed.  Examination of the bulb and  second portion revealed no abnormalities.   Therapeutic/diagnostic maneuvers performed:  None.   The patient tolerated the procedure well, was reacted in endoscopy.   IMPRESSION:  Normal esophagus, stomach, D1, D2.   RECOMMENDATIONS:  1.  Continue AcipHex, hyoscyamine, try Pamine Forte one tablet three times      daily p.r.n. abdominal pain.  2.  Repeat CBC today.  3.  Follow-up appointment with [place] in two weeks.      Jonathon Bellows, M.D.  Electronically Signed     RMR/MEDQ  D:  08/16/2005  T:  08/17/2005  Job:  [id number]   cc:   Laverle Hobby, M.D.  [address]  [place], [place] [address]

## 2010-08-24 NOTE — Procedures (Signed)
NAMEMARGENE, Heather [name]              ACCOUNT NO.:  [id number]   MEDICAL RECORD NO.:  [id number]          PATIENT TYPE:  OUT   LOCATION:  SLEEP LAB                     FACILITY:  [facility]   PHYSICIAN:  Marcelyn Bruins, M.D. [facility] DATE OF BIRTH:  Sep 04, 1975   DATE OF STUDY:  02/18/2004                              NOCTURNAL POLYSOMNOGRAM   REFERRING PHYSICIAN:  Elpidio Anis, MD   INDICATIONS FOR STUDY:  Hypersomnia with sleep apnea.   SLEEP ARCHITECTURE:  The patient had a total sleep time of 410 minutes with  very little REM, but large amounts of slow wave sleep. Sleep onset latency  was normal and REM latency was extremely prolonged.   IMPRESSION/RECOMMENDATIONS:  1.  Mild obstructive sleep apnea/hypopnea syndrome with a respiratory      disturbance index of 13 events per hour and O2 desaturation as low as      78%. The events were not positional.  2.  Moderate to loud snoring noted throughout the study.  3.  No clinically significant cardiac arrhythmia.  4.  Very large numbers of leg jerks with significant sleep disruption. This      raises the question of restless leg syndrome or periodic leg movement      syndrome contributing to his sleep disruption. Clinical correlation is      suggested.      KC/MEDQ  D:  03/05/2004 17:18:53  T:  03/05/2004 20:37:18  Job:  [id number]

## 2010-08-24 NOTE — Procedures (Signed)
NAMEAKIYAH, Heather [name]              ACCOUNT NO.:  [id number]   MEDICAL RECORD NO.:  [id number]          PATIENT TYPE:  OUT   LOCATION:  SLEEP LAB                     FACILITY:  [facility]   PHYSICIAN:  Barbaraann Share, MD,FCCPDATE OF BIRTH:  27-Mar-1976   DATE OF STUDY:  06/02/2006                            NOCTURNAL POLYSOMNOGRAM   REFERRING PHYSICIAN:  Franchot Heidelberg, M.D.   INDICATIONS FOR STUDY:  Hypersomnia with sleep apnea.   EPWORTH SCORE:  7.   SLEEP ARCHITECTURE:  The patient had total sleep time of 96 minutes with  very little REM and slow wave sleep.  Sleep onset latency was prolonged  at 57 minutes.  Sleep efficiency was very poor at 24%.   RESPIRATORY DATA:  The patient underwent split night protocol where she  was found to have 102 obstructive events in the first 67 minutes of  sleep.  This gave her a respiratory disturbance index of 91 events per  hour over that time period.  The events were not positional and there  was moderate to loud snoring noted throughout.  By split night protocol,  the patient was placed on a small Respironics Comfort Select CPAP mask,  however, the patient could not reinitiate sleep after CPAP was started.  Unfortunately, an adequate CPAP titration was not able to be obtained.   OXYGEN DATA:  There was O2 desaturation as low as 84% with the patient's  obstructive events.   CARDIAC DATA:  No clinically significant cardiac arrhythmias were noted.   MOVEMENT/PARASOMNIA:  The patient was found to have 28 leg jerks with 7  per hour resulting in arousal or awakening.   IMPRESSION/RECOMMENDATIONS:  1. Split night study reveals severe obstructive sleep apnea/hypopnea      syndrome with an apnea/hypopnea index of 91 events per hour and O2      desaturation as low as 84% over the first half of the night.  The      patient was placed on CPAP and unfortunately, could not reinitiate      sleep due to pressure intolerances.  At this point in time, I  would      recommend initiating CPAP at 8 cm and work on deconditioning.  A      sedative hypnotic could also be considered during the first few      weeks to help with tolerance.  Once the patient is able to wear on      a more consistent basis, I would highly recommend returning to the      sleep lab for formal CPAP titration rather than an auto titrate      device given the severity of her sleep apnea.  2. Small numbers of leg jerks with mild sleep disruption.  More than      likely, this is related to the patient's sleep disorder breathing.      Barbaraann Share, MD,FCCP  Diplomate, American Board of Sleep  Medicine  Electronically Signed     KMC/MEDQ  D:  06/19/2006 16:33:29  T:  06/21/2006 16:10:96  Job:  [id number]

## 2010-08-24 NOTE — Discharge Summary (Signed)
NAMEKEMORA, PINARD              ACCOUNT NO.:  [id number]   MEDICAL RECORD NO.:  [id number]          PATIENT TYPE:  IPS   LOCATION:  0307                          [facility]ILITY:  [facility]   PHYSICIAN:  Geoffery Lyons, M.D.      DATE OF BIRTH:  1975/08/29   DATE OF ADMISSION:  11/02/2004  DATE OF DISCHARGE:  11/05/2004                                 DISCHARGE SUMMARY   CHIEF COMPLAINT AND PRESENT ILLNESS:  This was the first admission to [facility] for this 35 year old white female, single,  involuntarily committed.  Reported becoming increasingly more agitated and  anxious.  The grandfather had a massive CVA two months prior to this  admission.  Then best friend moved away and found out another friend is ill.  She was feeling jittery and lonely.  Felt she cannot cope.  Doing more  drinking of alcohol.  Vague suicidal ideation.  Diagnosed bipolar.  Saw Dr.  Waunita Schooner, who increased her Klonopin.  Not sure if she can cope.  Admits to 4-  5 drinks weekly and cocaine.   PAST PSYCHIATRIC HISTORY:  [facility].  First  time at [facility].  Last time admitted in October 2005 after a  relationship broke.   ALCOHOL/DRUG HISTORY:  Endorsed marijuana and cocaine every two or three  weeks.  Also endorsed drinking, 4-5 drinks weekly.   MEDICAL HISTORY:  Arterial hypertension, chronic bronchitis.   MEDICATIONS:  Geodon 60 mg twice a day, Wellbutrin SR 200 mg in the morning,  Lithobid 300 mg, 2 at 8 a.m., 3 at 8 p.m., Lasix 20 mg twice a day,  doxycycline 100 mg twice a day, Lopid 600 mg twice a day, KCl 10 mEq in the  morning, Aciphex 20 mg in the morning, Klonopin 0.5 mg in the morning and 1  mg at night.   LABORATORY DATA:  CBC with white blood cell 11.2, hemoglobin 12.4.  Blood  chemistry with SGOT 18, SGPT 20.  TSH 4.376.   MENTAL STATUS EXAM:  Fully alert female, somewhat constricted affect.  Cooperative.  Speech normal rate, tempo and  production.  Mood anxious.  Depressed affect.  Anxious.  Thought processes logical, coherent and  relevant.  Vague suicidal ideation.  No homicidal ideation.  No delusions.  Cognition was well-preserved.   ADMISSION DIAGNOSES:  AXIS I:  Bipolar disorder.  Polysubstance abuse.  AXIS II:  Borderline intellectual functioning.  AXIS III:  Arterial hypertension, asthma.  AXIS IV:  Moderate.  AXIS V:  GAF upon admission 45; highest GAF in the last year 60.   HOSPITAL COURSE:  She was admitted.  She was started in individual and group  psychotherapy.  She was maintained on her medications.  She was started on  Celexa 10 mg per day.  She did endorse increased anxiety, panic attacks,  episodic crying spells.  Started sleeping better by the second day she was  in the unit.  Boyfriend was supportive.  Did endorse that she relapsed on  alcohol and marijuana.  By July 30th, she was feeling better.  She  was less  anxious, less depressed.  Endorsed that she let things build up and makes  bad choices by drinking and using drugs.  She was willing to abstain.  By  July 31st, she was in full contact with reality.  There were no suicidal  ideation, no homicidal ideation, no hallucinations, no delusions.  Endorsed  that she has not experienced any panic attacks while in the unit.  Endorsed  that the medication, she felt, were working better and she would like to  pursue the medications on an outpatient basis.   DISCHARGE DIAGNOSES:  AXIS I:  Bipolar disorder not otherwise specified.  Panic attacks.  Polysubstance abuse.  AXIS II:  Borderline intellectual functioning.  AXIS III:  Arterial hypertension, asthma.  AXIS IV:  Moderate.  AXIS V:  GAF upon discharge 50.   DISCHARGE MEDICATIONS:  1.  Geodon 60 mg twice a day.  2.  Wellbutrin SR 100 mg, 2 daily.  3.  Lithobid 300 mg, 2 in the morning and 3 at night.  4.  Lasix 20 mg per day.  5.  Doxycycline 100 mg twice a day.  6.  Lopid 600 mg twice a  day.  7.  K-Dur 10 mEq daily.  8.  Aciphex 20 mg daily.  9.  Klonopin 0.5 mg in the morning, 1 mg at night.  10. Celexa 20 mg, 1/2 daily.   FOLLOW UP:  [facility].      Geoffery Lyons, M.D.  Electronically Signed     IL/MEDQ  D:  11/27/2004  T:  11/27/2004  Job:  [id number]

## 2010-08-30 ENCOUNTER — Other Ambulatory Visit: Payer: Self-pay | Admitting: Obstetrics and Gynecology

## 2010-08-30 DIAGNOSIS — N938 Other specified abnormal uterine and vaginal bleeding: Secondary | ICD-10-CM

## 2010-09-04 ENCOUNTER — Ambulatory Visit (HOSPITAL_COMMUNITY): Payer: Medicare Other

## 2010-09-04 ENCOUNTER — Ambulatory Visit (HOSPITAL_COMMUNITY)
Admission: RE | Admit: 2010-09-04 | Discharge: 2010-09-04 | Disposition: A | Discharge status: Home / Self Care | Payer: Medicare Other | Source: Ambulatory Visit | Attending: Obstetrics and Gynecology | Admitting: Obstetrics and Gynecology

## 2010-09-04 DIAGNOSIS — N949 Unspecified condition associated with female genital organs and menstrual cycle: Secondary | ICD-10-CM | POA: Insufficient documentation

## 2010-09-04 DIAGNOSIS — N938 Other specified abnormal uterine and vaginal bleeding: Secondary | ICD-10-CM | POA: Insufficient documentation

## 2010-09-14 ENCOUNTER — Emergency Department (HOSPITAL_COMMUNITY): Payer: Medicare Other

## 2010-09-14 ENCOUNTER — Emergency Department (HOSPITAL_COMMUNITY)
Admission: EM | Admit: 2010-09-14 | Discharge: 2010-09-14 | Disposition: A | Discharge status: Home / Self Care | Payer: Medicare Other | Attending: Emergency Medicine | Admitting: Emergency Medicine

## 2010-09-14 DIAGNOSIS — J40 Bronchitis, not specified as acute or chronic: Secondary | ICD-10-CM | POA: Insufficient documentation

## 2010-09-14 DIAGNOSIS — R0789 Other chest pain: Secondary | ICD-10-CM | POA: Insufficient documentation

## 2010-09-14 DIAGNOSIS — N912 Amenorrhea, unspecified: Secondary | ICD-10-CM | POA: Insufficient documentation

## 2010-09-14 LAB — DIFFERENTIAL
Basophils Absolute: 0 10*3/uL (ref 0.0–0.1)
Eosinophils Relative: 4 % (ref 0–5)
Lymphocytes Relative: 29 % (ref 12–46)
Lymphs Abs: 4.1 10*3/uL — ABNORMAL HIGH (ref 0.7–4.0)
Monocytes Absolute: 0.7 10*3/uL (ref 0.1–1.0)
Monocytes Relative: 5 % (ref 3–12)

## 2010-09-14 LAB — BASIC METABOLIC PANEL
CO2: 28 mEq/L (ref 19–32)
Chloride: 103 mEq/L (ref 96–112)
Potassium: 3.7 mEq/L (ref 3.5–5.1)
Sodium: 138 mEq/L (ref 135–145)

## 2010-09-14 LAB — CBC
HCT: 36.7 % (ref 36.0–46.0)
MCH: 27.5 pg (ref 26.0–34.0)
MCHC: 31.3 g/dL (ref 30.0–36.0)
MCV: 87.8 fL (ref 78.0–100.0)
RDW: 15.6 % — ABNORMAL HIGH (ref 11.5–15.5)

## 2010-10-12 ENCOUNTER — Emergency Department (HOSPITAL_COMMUNITY)
Admission: EM | Admit: 2010-10-12 | Discharge: 2010-10-12 | Disposition: A | Discharge status: Home / Self Care | Payer: Medicare Other | Attending: Emergency Medicine | Admitting: Emergency Medicine

## 2010-10-12 DIAGNOSIS — F3289 Other specified depressive episodes: Secondary | ICD-10-CM | POA: Insufficient documentation

## 2010-10-12 DIAGNOSIS — Z87891 Personal history of nicotine dependence: Secondary | ICD-10-CM | POA: Insufficient documentation

## 2010-10-12 DIAGNOSIS — Z79899 Other long term (current) drug therapy: Secondary | ICD-10-CM | POA: Insufficient documentation

## 2010-10-12 DIAGNOSIS — F411 Generalized anxiety disorder: Secondary | ICD-10-CM | POA: Insufficient documentation

## 2010-10-12 DIAGNOSIS — R300 Dysuria: Secondary | ICD-10-CM | POA: Insufficient documentation

## 2010-10-12 DIAGNOSIS — F329 Major depressive disorder, single episode, unspecified: Secondary | ICD-10-CM | POA: Insufficient documentation

## 2010-10-12 LAB — POCT PREGNANCY, URINE: Preg Test, Ur: NEGATIVE

## 2010-10-12 LAB — URINALYSIS, ROUTINE W REFLEX MICROSCOPIC
Leukocytes, UA: NEGATIVE
Nitrite: NEGATIVE
Specific Gravity, Urine: 1.025 (ref 1.005–1.030)
Urobilinogen, UA: 0.2 mg/dL (ref 0.0–1.0)
pH: 6 (ref 5.0–8.0)

## 2010-10-23 ENCOUNTER — Encounter: Payer: Self-pay | Admitting: *Deleted

## 2010-10-23 ENCOUNTER — Emergency Department (HOSPITAL_COMMUNITY)
Admission: EM | Admit: 2010-10-23 | Discharge: 2010-10-24 | Disposition: A | Discharge status: Home / Self Care | Payer: Medicare Other | Attending: Emergency Medicine | Admitting: Emergency Medicine

## 2010-10-23 DIAGNOSIS — R109 Unspecified abdominal pain: Secondary | ICD-10-CM | POA: Insufficient documentation

## 2010-10-23 DIAGNOSIS — N912 Amenorrhea, unspecified: Secondary | ICD-10-CM | POA: Insufficient documentation

## 2010-10-23 DIAGNOSIS — F17200 Nicotine dependence, unspecified, uncomplicated: Secondary | ICD-10-CM | POA: Insufficient documentation

## 2010-10-23 DIAGNOSIS — R197 Diarrhea, unspecified: Secondary | ICD-10-CM | POA: Insufficient documentation

## 2010-10-23 DIAGNOSIS — N946 Dysmenorrhea, unspecified: Secondary | ICD-10-CM | POA: Insufficient documentation

## 2010-10-23 DIAGNOSIS — F319 Bipolar disorder, unspecified: Secondary | ICD-10-CM | POA: Insufficient documentation

## 2010-10-23 DIAGNOSIS — Z79899 Other long term (current) drug therapy: Secondary | ICD-10-CM | POA: Insufficient documentation

## 2010-10-23 DIAGNOSIS — M542 Cervicalgia: Secondary | ICD-10-CM | POA: Insufficient documentation

## 2010-10-23 HISTORY — DX: Major depressive disorder, single episode, unspecified: F32.9

## 2010-10-23 HISTORY — DX: Amenorrhea, unspecified: N91.2

## 2010-10-23 HISTORY — DX: Chronic obstructive pulmonary disease, unspecified: J44.9

## 2010-10-23 HISTORY — DX: Dysmenorrhea, unspecified: N94.6

## 2010-10-23 HISTORY — DX: Depression, unspecified: F32.A

## 2010-10-23 LAB — DIFFERENTIAL
Basophils Absolute: 0 10*3/uL (ref 0.0–0.1)
Basophils Relative: 0 % (ref 0–1)
Eosinophils Relative: 4 % (ref 0–5)
Lymphocytes Relative: 30 % (ref 12–46)
Monocytes Absolute: 0.7 10*3/uL (ref 0.1–1.0)

## 2010-10-23 LAB — CBC
Hemoglobin: 11.5 g/dL — ABNORMAL LOW (ref 12.0–15.0)
Platelets: 333 10*3/uL (ref 150–400)
RBC: 4.22 MIL/uL (ref 3.87–5.11)

## 2010-10-23 LAB — URINALYSIS, ROUTINE W REFLEX MICROSCOPIC
Glucose, UA: NEGATIVE mg/dL
Leukocytes, UA: NEGATIVE
Nitrite: NEGATIVE
Protein, ur: NEGATIVE mg/dL
pH: 6 (ref 5.0–8.0)

## 2010-10-23 LAB — PREGNANCY, URINE: Preg Test, Ur: NEGATIVE

## 2010-10-23 NOTE — ED Notes (Signed)
C/o right sided abd pain with diarrhea, black tarry stools, episodes of incontinence that started today.  States had large bm today and that's when pain began.  Denies n/v.  States when having bm states felt like "something dropped in vaginal area."  Denies decreased appetite.

## 2010-10-23 NOTE — ED Provider Notes (Signed)
History     Chief Complaint  Patient presents with  . Abdominal Pain   Patient is a 35 y.o. female presenting with abdominal pain. The history is provided by the patient.  Abdominal Pain The primary symptoms of the illness include abdominal pain and diarrhea. The primary symptoms of the illness do not include fever, fatigue, shortness of breath, nausea, vomiting, hematemesis, hematochezia, dysuria or vaginal bleeding. The current episode started 3 to 5 hours ago. The onset of the illness was gradual. The problem has not changed since onset. Symptoms associated with the illness do not include chills, anorexia, constipation, urgency, hematuria, frequency or back pain. Significant associated medical issues do not include PUD, GERD, inflammatory bowel disease or diverticulitis.    Past Medical History  Diagnosis Date  . Depression   . Amenorrhea   . Bipolar 1 disorder   . Dysmenorrhea   . COPD (chronic obstructive pulmonary disease)     Past Surgical History  Procedure Date  . Cholecystectomy   . Tonsillectomy     History reviewed. No pertinent family history.  History  Substance Use Topics  . Smoking status: Current Some Day Smoker  . Smokeless tobacco: Not on file  . Alcohol Use: No    OB History    Grav Para Term Preterm Abortions TAB SAB Ect Mult Living                  Review of Systems  Constitutional: Negative for fever, chills, appetite change and fatigue.  HENT: Positive for neck pain. Negative for neck stiffness.   Respiratory: Negative for choking, chest tightness and shortness of breath.   Cardiovascular: Negative.   Gastrointestinal: Positive for abdominal pain and diarrhea. Negative for nausea, vomiting, constipation, blood in stool, hematochezia, anal bleeding, rectal pain, anorexia and hematemesis.  Genitourinary: Negative for dysuria, urgency, frequency, hematuria, flank pain and vaginal bleeding.  Musculoskeletal: Negative for back pain.  Skin:  Negative.   Neurological: Negative for dizziness, tremors, weakness and headaches.  Hematological: Does not bruise/bleed easily.    Physical Exam  BP 134/86  Pulse 83  Temp(Src) 98.9 F (37.2 C) (Oral)  Resp 21  Ht 5\' 3"  (1.6 m)  Wt 449 lb (203.665 kg)  BMI 79.54 kg/m2  SpO2 95%  Physical Exam  Nursing note and vitals reviewed. Constitutional: She is oriented to person, place, and time. She appears well-developed and well-nourished. No distress.  HENT:  Head: Normocephalic and atraumatic.  Neck: Normal range of motion.  Cardiovascular: Normal rate, regular rhythm and normal heart sounds.   Pulmonary/Chest: Effort normal and breath sounds normal.  Abdominal: Soft. Bowel sounds are normal. She exhibits no distension and no mass. There is no hepatosplenomegaly. There is generalized tenderness. There is no rigidity, no rebound, no guarding and no CVA tenderness.       Pt is morbidly obese, exam limited by body habitus  Genitourinary: Rectal exam shows no external hemorrhoid, no fissure, no mass and no tenderness. Guaiac negative stool.  Musculoskeletal: Normal range of motion.  Lymphadenopathy:    She has no cervical adenopathy.  Neurological: She is alert and oriented to person, place, and time. No cranial nerve deficit. Coordination normal.  Skin: Skin is warm and dry.  Psychiatric: She has a normal mood and affect.    ED Course  Procedures  MDM  Patient has also been seen by the EDP.  She is non-toxic appearing.  Vitals stable.  Abd remains soft, NT, w/o guarding or rebound tenderness. guiac  neg stool.   Previous ED visits were reviewed by me. I have discussed the importance of [place] f/u with the patient and need to return if sx's worsen  0035  Patient ambulating in the ED.  Feels better, requesting to go home.  Agrees to f/u with her PMD this week.   Henson Fraticelli L. [place], [place] 11/03/10 1649

## 2010-10-24 LAB — BASIC METABOLIC PANEL
CO2: 22 mEq/L (ref 19–32)
Chloride: 104 mEq/L (ref 96–112)
Creatinine, Ser: 0.47 mg/dL — ABNORMAL LOW (ref 0.50–1.10)
GFR calc Af Amer: >60 mL/min (ref >60)
Potassium: 3.9 mEq/L (ref 3.5–5.1)
Sodium: 137 mEq/L (ref 135–145)

## 2010-10-24 LAB — HEPATIC FUNCTION PANEL
ALT: 24 U/L (ref 0–35)
Bilirubin, Direct: <0.1 mg/dL (ref 0.0–0.3)
Total Protein: 7.5 g/dL (ref 6.0–8.3)

## 2010-10-24 MED ORDER — ONDANSETRON 8 MG PO TBDP
8.0000 mg | ORAL_TABLET | Freq: Once | ORAL | Status: AC
  Administered 2010-10-24: 8 mg via ORAL
  Filled 2010-10-24: qty 1

## 2010-10-24 MED ORDER — OXYCODONE-ACETAMINOPHEN 5-325 MG PO TABS
2.0000 | ORAL_TABLET | Freq: Once | ORAL | Status: AC
  Administered 2010-10-24: 2 via ORAL
  Filled 2010-10-24: qty 2

## 2010-10-24 NOTE — Discharge Instructions (Signed)
Abdominal Pain Abdominal pain can be caused by many things. Your caregiver decides the seriousness of your pain by an examination and possibly blood tests and X-rays. Many cases can be observed and treated at home. Most abdominal pain is not caused by a disease and will probably improve without treatment. However, in many cases, more time must pass before a clear cause of the pain can be found. Before that point, it may not be known if you need more testing, or if hospitalization or surgery is needed. HOME CARE INSTRUCTIONS  Do not take laxatives unless directed by your caregiver.   Take pain medicine only as directed by your caregiver.   Only take over-the-counter or prescription medicines for pain, discomfort, or fever as directed by your caregiver.   Try a clear liquid diet (broth, tea, or water) for 2-3 days or as ordered by your caregiver. Slowly move to a bland diet as tolerated.  SEEK IMMEDIATE MEDICAL CARE IF:  The pain does not go away.   You or your child has an oral temperature above 101, not controlled by medicine.   You keep throwing up (vomiting).   The pain is felt only in portions of the abdomen. Pain in the right side could possibly be appendicitis. In an adult, pain in the left lower portion of the abdomen could be colitis or diverticulitis.   You pass bloody or black tarry stools.  MAKE SURE YOU:  Understand these instructions.   Will watch your condition.   Will get help right away if you are not doing well or get worse.  Document Released: 01/02/2005 Document Re-Released: 06/19/2009 [facility] Patient Information 2011 [place], [place].

## 2010-10-26 ENCOUNTER — Other Ambulatory Visit (HOSPITAL_COMMUNITY): Payer: Self-pay | Admitting: Family Medicine

## 2010-10-26 DIAGNOSIS — M5410 Radiculopathy, site unspecified: Secondary | ICD-10-CM

## 2010-10-26 DIAGNOSIS — M545 Low back pain: Secondary | ICD-10-CM

## 2010-11-03 ENCOUNTER — Emergency Department (HOSPITAL_COMMUNITY): Payer: Medicare Other

## 2010-11-03 ENCOUNTER — Emergency Department (HOSPITAL_COMMUNITY)
Admission: EM | Admit: 2010-11-03 | Discharge: 2010-11-03 | Disposition: A | Discharge status: Home / Self Care | Payer: Medicare Other | Attending: Emergency Medicine | Admitting: Emergency Medicine

## 2010-11-03 DIAGNOSIS — M545 Low back pain, unspecified: Secondary | ICD-10-CM | POA: Insufficient documentation

## 2010-11-03 DIAGNOSIS — R209 Unspecified disturbances of skin sensation: Secondary | ICD-10-CM | POA: Insufficient documentation

## 2010-11-03 DIAGNOSIS — R32 Unspecified urinary incontinence: Secondary | ICD-10-CM | POA: Insufficient documentation

## 2010-11-03 DIAGNOSIS — E669 Obesity, unspecified: Secondary | ICD-10-CM | POA: Insufficient documentation

## 2010-11-03 DIAGNOSIS — R5381 Other malaise: Secondary | ICD-10-CM | POA: Insufficient documentation

## 2010-11-03 DIAGNOSIS — IMO0002 Reserved for concepts with insufficient information to code with codable children: Secondary | ICD-10-CM | POA: Insufficient documentation

## 2010-11-03 DIAGNOSIS — R339 Retention of urine, unspecified: Secondary | ICD-10-CM | POA: Insufficient documentation

## 2010-11-03 DIAGNOSIS — R159 Full incontinence of feces: Secondary | ICD-10-CM | POA: Insufficient documentation

## 2010-11-03 DIAGNOSIS — Z79899 Other long term (current) drug therapy: Secondary | ICD-10-CM | POA: Insufficient documentation

## 2010-11-03 DIAGNOSIS — M79609 Pain in unspecified limb: Secondary | ICD-10-CM | POA: Insufficient documentation

## 2010-11-03 DIAGNOSIS — J449 Chronic obstructive pulmonary disease, unspecified: Secondary | ICD-10-CM | POA: Insufficient documentation

## 2010-11-03 DIAGNOSIS — J4489 Other specified chronic obstructive pulmonary disease: Secondary | ICD-10-CM | POA: Insufficient documentation

## 2010-11-03 DIAGNOSIS — R5383 Other fatigue: Secondary | ICD-10-CM | POA: Insufficient documentation

## 2010-11-03 LAB — URINALYSIS, ROUTINE W REFLEX MICROSCOPIC
Nitrite: NEGATIVE
Specific Gravity, Urine: 1.021 (ref 1.005–1.030)
Urobilinogen, UA: 0.2 mg/dL (ref 0.0–1.0)
pH: 6 (ref 5.0–8.0)

## 2010-11-03 LAB — URINE MICROSCOPIC-ADD ON

## 2010-11-03 LAB — POCT PREGNANCY, URINE: Preg Test, Ur: NEGATIVE

## 2010-11-04 NOTE — ED Provider Notes (Addendum)
History     Chief Complaint  Patient presents with  . Abdominal Pain   HPI  Past Medical History  Diagnosis Date  . Depression   . Amenorrhea   . Bipolar 1 disorder   . Dysmenorrhea   . COPD (chronic obstructive pulmonary disease)     Past Surgical History  Procedure Date  . Cholecystectomy   . Tonsillectomy     History reviewed. No pertinent family history.  History  Substance Use Topics  . Smoking status: Current Some Day Smoker  . Smokeless tobacco: Not on file  . Alcohol Use: No    OB History    Grav Para Term Preterm Abortions TAB SAB Ect Mult Living                  Review of Systems  Physical Exam  BP 129/78  Pulse 80  Temp(Src) 98.9 F (37.2 C) (Oral)  Resp 21  Ht 5\' 3"  (1.6 m)  Wt 449 lb (203.665 kg)  BMI 79.54 kg/m2  SpO2 94%  Physical Exam  ED Course  Procedures  MDM   Medical screening examination/treatment/procedure(s) were performed by non-physician practitioner and as supervising physician I was immediately available for consultation/collaboration.    Donnetta Hutching, MD 11/04/10 1610  Donnetta Hutching, MD 11/23/10 [phone]

## 2010-11-06 ENCOUNTER — Encounter (HOSPITAL_COMMUNITY): Payer: Self-pay | Admitting: *Deleted

## 2010-11-06 ENCOUNTER — Emergency Department (HOSPITAL_COMMUNITY)
Admission: EM | Admit: 2010-11-06 | Discharge: 2010-11-06 | Discharge status: Against Medical Advice | Payer: Medicare Other | Attending: Emergency Medicine | Admitting: Emergency Medicine

## 2010-11-06 DIAGNOSIS — Z5320 Procedure and treatment not carried out because of patient's decision for unspecified reasons: Secondary | ICD-10-CM | POA: Insufficient documentation

## 2010-11-06 DIAGNOSIS — N939 Abnormal uterine and vaginal bleeding, unspecified: Secondary | ICD-10-CM

## 2010-11-06 DIAGNOSIS — N898 Other specified noninflammatory disorders of vagina: Secondary | ICD-10-CM | POA: Insufficient documentation

## 2010-11-06 NOTE — ED Notes (Signed)
Pt not in waiting room 1st attempt

## 2010-11-06 NOTE — ED Notes (Signed)
Pt c/o heavy vaginal bleeding since this morning.

## 2010-11-06 NOTE — ED Notes (Signed)
Pt also c/o diarrhea x 2 days.

## 2010-11-19 NOTE — ED Provider Notes (Signed)
History     CSN: [id number] Arrival date & time: 11/06/2010 10:33 PM  Chief Complaint  Patient presents with  . Vaginal Bleeding   HPI  Past Medical History  Diagnosis Date  . Depression   . Amenorrhea   . Bipolar 1 disorder   . Dysmenorrhea   . COPD (chronic obstructive pulmonary disease)     Past Surgical History  Procedure Date  . Cholecystectomy   . Tonsillectomy     History reviewed. No pertinent family history.  History  Substance Use Topics  . Smoking status: Current Some Day Smoker  . Smokeless tobacco: Not on file  . Alcohol Use: No    OB History    Grav Para Term Preterm Abortions TAB SAB Ect Mult Living                  Review of Systems  Physical Exam  BP 134/66  Pulse 92  Temp(Src) 98.6 F (37 C) (Oral)  Resp 22  Ht 5\' 3"  (1.6 m)  Wt 448 lb 11.2 oz (203.529 kg)  BMI 79.48 kg/m2  SpO2 100%  LMP 10/23/2010  Physical Exam  ED Course  Procedures  Pt left ama,  Pt not seen by me.  [name], MD 11/23/10 [phone]

## 2010-11-24 ENCOUNTER — Encounter (HOSPITAL_COMMUNITY): Payer: Self-pay | Admitting: Emergency Medicine

## 2010-11-24 ENCOUNTER — Emergency Department (HOSPITAL_COMMUNITY)
Admission: EM | Admit: 2010-11-24 | Discharge: 2010-11-24 | Disposition: A | Discharge status: Home / Self Care | Payer: Medicare Other | Attending: Emergency Medicine | Admitting: Emergency Medicine

## 2010-11-24 ENCOUNTER — Other Ambulatory Visit: Payer: Self-pay

## 2010-11-24 ENCOUNTER — Emergency Department (HOSPITAL_COMMUNITY): Payer: Medicare Other

## 2010-11-24 DIAGNOSIS — J449 Chronic obstructive pulmonary disease, unspecified: Secondary | ICD-10-CM | POA: Insufficient documentation

## 2010-11-24 DIAGNOSIS — R05 Cough: Secondary | ICD-10-CM | POA: Insufficient documentation

## 2010-11-24 DIAGNOSIS — R197 Diarrhea, unspecified: Secondary | ICD-10-CM

## 2010-11-24 DIAGNOSIS — R300 Dysuria: Secondary | ICD-10-CM | POA: Insufficient documentation

## 2010-11-24 DIAGNOSIS — R109 Unspecified abdominal pain: Secondary | ICD-10-CM

## 2010-11-24 DIAGNOSIS — R0789 Other chest pain: Secondary | ICD-10-CM | POA: Insufficient documentation

## 2010-11-24 DIAGNOSIS — R079 Chest pain, unspecified: Secondary | ICD-10-CM

## 2010-11-24 DIAGNOSIS — R059 Cough, unspecified: Secondary | ICD-10-CM | POA: Insufficient documentation

## 2010-11-24 DIAGNOSIS — R0602 Shortness of breath: Secondary | ICD-10-CM | POA: Insufficient documentation

## 2010-11-24 DIAGNOSIS — J4489 Other specified chronic obstructive pulmonary disease: Secondary | ICD-10-CM | POA: Insufficient documentation

## 2010-11-24 DIAGNOSIS — IMO0002 Reserved for concepts with insufficient information to code with codable children: Secondary | ICD-10-CM | POA: Insufficient documentation

## 2010-11-24 LAB — BASIC METABOLIC PANEL
BUN: 13 mg/dL (ref 6–23)
CO2: 28 mEq/L (ref 19–32)
Calcium: 9.7 mg/dL (ref 8.4–10.5)
Chloride: 104 mEq/L (ref 96–112)
Creatinine, Ser: 0.62 mg/dL (ref 0.50–1.10)

## 2010-11-24 LAB — DIFFERENTIAL
Eosinophils Relative: 4 % (ref 0–5)
Lymphocytes Relative: 19 % (ref 12–46)
Monocytes Absolute: 0.7 10*3/uL (ref 0.1–1.0)
Monocytes Relative: 5 % (ref 3–12)
Neutro Abs: 9.9 10*3/uL — ABNORMAL HIGH (ref 1.7–7.7)

## 2010-11-24 LAB — URINALYSIS, ROUTINE W REFLEX MICROSCOPIC
Bilirubin Urine: NEGATIVE
Glucose, UA: NEGATIVE mg/dL
Ketones, ur: NEGATIVE mg/dL
Protein, ur: 100 mg/dL — AB
pH: 6 (ref 5.0–8.0)

## 2010-11-24 LAB — CBC
HCT: 38.2 % (ref 36.0–46.0)
Hemoglobin: 12.1 g/dL (ref 12.0–15.0)
MCHC: 31.7 g/dL (ref 30.0–36.0)
MCV: 88.4 fL (ref 78.0–100.0)
RDW: 15.6 % — ABNORMAL HIGH (ref 11.5–15.5)
WBC: 13.9 10*3/uL — ABNORMAL HIGH (ref 4.0–10.5)

## 2010-11-24 LAB — URINE MICROSCOPIC-ADD ON

## 2010-11-24 MED ORDER — ONDANSETRON HCL 4 MG PO TABS: 4.0000 mg | ORAL_TABLET | Freq: Four times a day (QID) | ORAL | Status: AC

## 2010-11-24 MED ORDER — SODIUM CHLORIDE 0.9 % IV BOLUS (SEPSIS)
500.0000 mL | Freq: Once | INTRAVENOUS | Status: AC
  Administered 2010-11-24: 500 mL via INTRAVENOUS

## 2010-11-24 MED ORDER — ALBUTEROL SULFATE (5 MG/ML) 0.5% IN NEBU
2.5000 mg | INHALATION_SOLUTION | Freq: Once | RESPIRATORY_TRACT | Status: AC
  Administered 2010-11-24: 2.5 mg via RESPIRATORY_TRACT
  Filled 2010-11-24: qty 0.5

## 2010-11-24 NOTE — ED Provider Notes (Signed)
Scribed for [employer]. Rubin Payor, MD, the patient was seen in room APA06/APA06. This chart was scribed by Clarita Crane. This patient's care was started at 9:29AM.  History     CSN: [id number] Arrival date & time: 11/24/2010  9:16 AM  Chief Complaint  Patient presents with  . Shortness of Breath  . Abdominal Pain   The history is provided by the patient.   Heather [name] is a 35 y.o. female who presents to the Emergency Department complaining of chest pain which began this morning and associated symptoms of productive cough, SOB, abdominal pain, pressure in the lower abdomen, diarrhea, burning while urinating, and numbness in the legs. Denies fever. Patient notes cough, congestion and SOB began several days prior to onset of chest pain, abdominal pain and pressure and numbness of lower extremities started this morning. Patient states diarrhea experienced this morning lasted 1 hour. Also notes having two "bulging discs" at L4 and L5 and previous episodes of numbness of lower extremities. Patient was evaluated at [facility] regarding these symptoms two days ago and was a prescription for Prednisone and a Z-pack. Patient is a current smoker.    HPI ELEMENTS: Location: Sternum Onset: This morning. Duration: Persistent since onset. Timing:Constant Quality: Pressure-like  Context: as above  Associated symptoms: productive Cough, SOB, abdominal pain, pressure in the lower abdomen, diarrhea, burning while urinating, and numbness in the legs. Patient is otherwise healthy and denies any other symptoms. Denies fever.    PAST MEDICAL HISTORY:  Past Medical History  Diagnosis Date  . Depression   . Amenorrhea   . Bipolar 1 disorder   . Dysmenorrhea   . COPD (chronic obstructive pulmonary disease)     PAST SURGICAL HISTORY:  Past Surgical History  Procedure Date  . Cholecystectomy   . Tonsillectomy     MEDICATIONS:  Previous Medications   ACETAMINOPHEN (TYLENOL) 650  MG CR TABLET    Take 650 mg by mouth 3 (three) times daily. For back pain    APAP-PARABROM-PYRILAMINE (PAMPRIN MULTI-SYMPTOM) 500-25-15 MG TABS    Take 1 tablet by mouth as needed. For pain    ARIPIPRAZOLE (ABILIFY) 5 MG TABLET    Take 5 mg by mouth every morning.    AZITHROMYCIN (ZITHROMAX) 250 MG TABLET    Take 250 mg by mouth daily. Take 2 tablets on day 1 then take 1 tablet every day for 4 more days    BENZONATATE (TESSALON) 100 MG CAPSULE    Take 100 mg by mouth 3 (three) times daily.     BUSPIRONE (BUSPAR) 10 MG TABLET    Take 10 mg by mouth 2 (two) times daily as needed. For anxiety   CLONAZEPAM (KLONOPIN) 1 MG TABLET    Take 2 mg by mouth at bedtime.    CYCLOBENZAPRINE (FLEXERIL) 5 MG TABLET    Take 10 mg by mouth at bedtime.     ESCITALOPRAM (LEXAPRO) 20 MG TABLET    Take 20 mg by mouth every morning.     MEDROXYPROGESTERONE (PROVERA) 10 MG TABLET    Take 10 mg by mouth as directed. Take1 tablet twice daily for two weeks, then repeat again every 3 months. Next regimen is in October.   MEGESTROL (MEGACE) 40 MG TABLET    Take 40 mg by mouth as needed. Take one tablet by mouth three times daily for 5 days to help with heavy menstrual bleeding    PREDNISONE (DELTASONE) 20 MG TABLET    Take 40 mg by  mouth daily with breakfast.       ALLERGIES:  Allergies as of 11/24/2010 - Review Complete 11/24/2010  Allergen Reaction Noted  . Penicillins Anaphylaxis 05/12/2006  . Other Swelling 10/23/2010     FAMILY HISTORY:  No family history on file.   SOCIAL HISTORY: History   Social History  . Marital Status: Single    Spouse Name: N/A    Number of Children: N/A  . Years of Education: N/A   Social History Main Topics  . Smoking status: Current Some Day Smoker  . Smokeless tobacco: None  . Alcohol Use: No  . Drug Use: No  . Sexually Active:    Other Topics Concern  . None   Social History Narrative  . None     Review of Systems  10 Systems reviewed and are negative for acute  change except as noted in the HPI.  Physical Exam  BP 122/97  Pulse 72  Temp(Src) 98.2 F (36.8 C) (Oral)  Resp 23  Ht 5\' 3"  (1.6 m)  Wt 448 lb (203.211 kg)  BMI 79.36 kg/m2  SpO2 98%  LMP 10/23/2010  Physical Exam  Nursing note and vitals reviewed. Constitutional: She is oriented to person, place, and time. She appears well-developed and well-nourished.  HENT:  Head: Normocephalic and atraumatic.  Eyes: EOM are normal.  Neck: Neck supple.  Cardiovascular: Normal rate and regular rhythm.  Exam reveals no gallop and no friction rub.   No murmur heard. Pulmonary/Chest: Breath sounds normal. She has no wheezes. She exhibits tenderness (mild epigastric).  Abdominal: Soft. Bowel sounds are normal. She exhibits no distension. There is tenderness (Mild right-sided).       Obese.  Musculoskeletal: She exhibits no edema.       Lumbar tenderness.    Neurological: She is alert and oriented to person, place, and time. No sensory deficit.  Skin: Skin is warm and dry.  Psychiatric: She has a normal mood and affect. Her behavior is normal.    ED Course  Procedures  MDM OTHER DATA REVIEWED: Nursing notes, vital signs, and past medical records reviewed. Lab results reviewed and considered Imaging results reviewed and considered  DIAGNOSTIC STUDIES: Oxygen Saturation is 100% on room air, normal by my interpretation.    LABS / RADIOLOGY: Results for orders placed during the hospital encounter of 11/24/10  CBC      Component Value Range   WBC 13.9 (*) 4.0 - 10.5 (K/uL)   RBC 4.32  3.87 - 5.11 (MIL/uL)   Hemoglobin 12.1  12.0 - 15.0 (g/dL)   HCT 16.1  09.6 - 04.5 (%)   MCV 88.4  78.0 - 100.0 (fL)   MCH 28.0  26.0 - 34.0 (pg)   MCHC 31.7  30.0 - 36.0 (g/dL)   RDW 40.9 (*) 81.1 - 15.5 (%)   Platelets 328  150 - 400 (K/uL)  DIFFERENTIAL      Component Value Range   Neutrophils Relative 72  43 - 77 (%)   Neutro Abs 9.9 (*) 1.7 - 7.7 (K/uL)   Lymphocytes Relative 19  12 - 46 (%)     Lymphs Abs 2.7  0.7 - 4.0 (K/uL)   Monocytes Relative 5  3 - 12 (%)   Monocytes Absolute 0.7  0.1 - 1.0 (K/uL)   Eosinophils Relative 4  0 - 5 (%)   Eosinophils Absolute 0.5  0.0 - 0.7 (K/uL)   Basophils Relative 0  0 - 1 (%)   Basophils Absolute 0.0  0.0 - 0.1 (K/uL)  BASIC METABOLIC PANEL      Component Value Range   Sodium 140  135 - 145 (mEq/L)   Potassium 4.0  3.5 - 5.1 (mEq/L)   Chloride 104  96 - 112 (mEq/L)   CO2 28  19 - 32 (mEq/L)   Glucose, Bld 104 (*) 70 - 99 (mg/dL)   BUN 13  6 - 23 (mg/dL)   Creatinine, Ser 1.61  0.50 - 1.10 (mg/dL)   Calcium 9.7  8.4 - 09.6 (mg/dL)   GFR calc non Af Amer >60  >60 (mL/min)   GFR calc Af Amer >60  >60 (mL/min)  URINALYSIS, ROUTINE W REFLEX MICROSCOPIC      Component Value Range   Color, Urine YELLOW  YELLOW    Appearance CLEAR  CLEAR    Specific Gravity, Urine 1.020  1.005 - 1.030    pH 6.0  5.0 - 8.0    Glucose, UA NEGATIVE  NEGATIVE (mg/dL)   Hgb urine dipstick TRACE (*) NEGATIVE    Bilirubin Urine NEGATIVE  NEGATIVE    Ketones, ur NEGATIVE  NEGATIVE (mg/dL)   Protein, ur 045 (*) NEGATIVE (mg/dL)   Urobilinogen, UA 0.2  0.0 - 1.0 (mg/dL)   Nitrite NEGATIVE  NEGATIVE    Leukocytes, UA NEGATIVE  NEGATIVE   PREGNANCY, URINE      Component Value Range   Preg Test, Ur NEGATIVE    URINE MICROSCOPIC-ADD ON      Component Value Range   Squamous Epithelial / LPF RARE  RARE    WBC, UA 3-6  <3 (WBC/hpf)   RBC / HPF 0-2  <3 (RBC/hpf)   Bacteria, UA FEW (*) RARE    Dg Chest 2 View  11/24/2010  *RADIOLOGY REPORT*  Clinical Data: Shortness of breath and dizziness  CHEST - 2 VIEW  Comparison: 09/14/2010  Findings: Examination is degraded by patient motion. Fine detail is obscured by patient body habitus.  Heart size is normal.  Central vascular congestion is noted without focal pulmonary opacity.  No pleural effusion.  No acute osseous finding.  IMPRESSION: Allowing for suboptimal visualization due to patient motion and body habitus, no  focal acute finding.  Original Report Authenticated By: Harrel Lemon, M.D.    PROCEDURES:  ED COURSE / COORDINATION OF CARE: Orders Placed This Encounter  Procedures  . Urine culture  . DG Chest 2 View  . CBC  . Differential  . Basic metabolic panel  . Urinalysis with microscopic  . Pregnancy, urine  . Urine microscopic-add on  . Cardiac monitoring  . In and Out Cath  . Encourage fluids  . Pulse oximetry, continuous  . ED EKG    EKG: rate 84  normal EKG, normal sinus rhythm, unchanged from previous tracings.  10:53AM- Patient informed of lab and imaging results. Explained need to order urine culture to further evaluate possible urinary infection. Intent to monitor patients ability to consume fluids and will re-evaluate. Patient agrees with plan set forth at this time.   MDM: Chest pain and abdominal pain. Multiple complaints. Already on abx for brochitis. Xray no pneumonia. Diarrhea without vomiting.labs reassuring except for possible UTI. Culture sent. Tolerating orals here. Doubt cardiac or PE as cause of pains. Discharged.     I personally performed the services described in this documentation, which was scribed in my presence. The recorded information has been reviewed and considered. Juliet Rude. Rubin Payor, MD         Juliet Rude. Rubin Payor,  MD 11/24/10 1225

## 2010-11-24 NOTE — ED Notes (Addendum)
Patient arrives from home via EMS with c/o epigastric pain and diffuse lower abdominal pain that started at 0700 today. Patient denies nausea/vomiting. Skin warm and dry. Patient with cough and reports recent diagnosis of bronchitis and sinusitis. Denies fever at home.   Patient given 324 mg ASA and 1 SL nitroglycerin by EMS.

## 2010-11-24 NOTE — ED Notes (Signed)
Patient placed on continuous cardiac monitoring, continuous pulse oximetry, and NBP cycling q 30 minutes.  

## 2010-11-24 NOTE — ED Notes (Signed)
Dr Pickering at bedside 

## 2010-11-24 NOTE — ED Notes (Signed)
Pt drinking pepsi with no n/v

## 2010-11-24 NOTE — ED Notes (Signed)
Pt requested sprite and is drinking and resting comfortably. Boyfriend at bedside.

## 2010-11-25 LAB — URINE CULTURE

## 2011-01-15 ENCOUNTER — Other Ambulatory Visit: Payer: Self-pay | Admitting: Obstetrics and Gynecology

## 2011-01-15 LAB — CBC
HCT: 41.2
MCHC: 32
MCV: 86.7
Platelets: 369
WBC: 16.2 — ABNORMAL HIGH

## 2011-01-15 LAB — BASIC METABOLIC PANEL
BUN: 9
CO2: 29
Chloride: 103
Potassium: 4

## 2011-01-15 LAB — DIFFERENTIAL
Eosinophils Absolute: 0.7
Eosinophils Relative: 4
Lymphs Abs: 4.2 — ABNORMAL HIGH
Monocytes Relative: 4

## 2011-01-15 LAB — PREGNANCY, URINE: Preg Test, Ur: NEGATIVE

## 2011-01-21 LAB — URINALYSIS, ROUTINE W REFLEX MICROSCOPIC
Glucose, UA: NEGATIVE
Ketones, ur: NEGATIVE
pH: 6.5

## 2011-01-23 LAB — BLOOD GAS, ARTERIAL
Acid-Base Excess: 1.5
Bicarbonate: 25.9 — ABNORMAL HIGH
FIO2: 0.21
O2 Saturation: 96.8
Patient temperature: 37
TCO2: 22.9
pCO2 arterial: 43.3
pH, Arterial: 7.394
pO2, Arterial: 86.9

## 2011-02-09 ENCOUNTER — Emergency Department (HOSPITAL_COMMUNITY)
Admission: EM | Admit: 2011-02-09 | Discharge: 2011-02-09 | Disposition: A | Discharge status: Home / Self Care | Payer: Medicare Other | Attending: Emergency Medicine | Admitting: Emergency Medicine

## 2011-02-09 ENCOUNTER — Emergency Department (HOSPITAL_COMMUNITY): Payer: Medicare Other

## 2011-02-09 ENCOUNTER — Other Ambulatory Visit: Payer: Self-pay

## 2011-02-09 ENCOUNTER — Encounter (HOSPITAL_COMMUNITY): Payer: Self-pay

## 2011-02-09 DIAGNOSIS — R079 Chest pain, unspecified: Secondary | ICD-10-CM | POA: Insufficient documentation

## 2011-02-09 DIAGNOSIS — R1032 Left lower quadrant pain: Secondary | ICD-10-CM | POA: Insufficient documentation

## 2011-02-09 DIAGNOSIS — E663 Overweight: Secondary | ICD-10-CM | POA: Insufficient documentation

## 2011-02-09 DIAGNOSIS — R1110 Vomiting, unspecified: Secondary | ICD-10-CM | POA: Insufficient documentation

## 2011-02-09 DIAGNOSIS — F17200 Nicotine dependence, unspecified, uncomplicated: Secondary | ICD-10-CM | POA: Insufficient documentation

## 2011-02-09 DIAGNOSIS — J4489 Other specified chronic obstructive pulmonary disease: Secondary | ICD-10-CM | POA: Insufficient documentation

## 2011-02-09 DIAGNOSIS — N390 Urinary tract infection, site not specified: Secondary | ICD-10-CM | POA: Insufficient documentation

## 2011-02-09 DIAGNOSIS — F319 Bipolar disorder, unspecified: Secondary | ICD-10-CM | POA: Insufficient documentation

## 2011-02-09 DIAGNOSIS — M549 Dorsalgia, unspecified: Secondary | ICD-10-CM | POA: Insufficient documentation

## 2011-02-09 DIAGNOSIS — J449 Chronic obstructive pulmonary disease, unspecified: Secondary | ICD-10-CM | POA: Insufficient documentation

## 2011-02-09 DIAGNOSIS — Z9889 Other specified postprocedural states: Secondary | ICD-10-CM | POA: Insufficient documentation

## 2011-02-09 LAB — URINALYSIS, ROUTINE W REFLEX MICROSCOPIC
Glucose, UA: NEGATIVE mg/dL
Leukocytes, UA: NEGATIVE
Nitrite: NEGATIVE
Specific Gravity, Urine: >1.03 — ABNORMAL HIGH (ref 1.005–1.030)
Urobilinogen, UA: 0.2 mg/dL (ref 0.0–1.0)
pH: 5 (ref 5.0–8.0)

## 2011-02-09 LAB — URINE MICROSCOPIC-ADD ON

## 2011-02-09 MED ORDER — KETOROLAC TROMETHAMINE 30 MG/ML IJ SOLN
60.0000 mg | Freq: Once | INTRAMUSCULAR | Status: AC
  Administered 2011-02-09: 60 mg via INTRAMUSCULAR
  Filled 2011-02-09 (×2): qty 1

## 2011-02-09 MED ORDER — OXYCODONE-ACETAMINOPHEN 5-325 MG PO TABS
2.0000 | ORAL_TABLET | Freq: Once | ORAL | Status: AC
  Administered 2011-02-09: 2 via ORAL
  Filled 2011-02-09: qty 2

## 2011-02-09 MED ORDER — OXYCODONE-ACETAMINOPHEN 5-325 MG PO TABS: 2.0000 | ORAL_TABLET | ORAL | Status: AC | PRN

## 2011-02-09 MED ORDER — CIPROFLOXACIN HCL 500 MG PO TABS: 500.0000 mg | ORAL_TABLET | Freq: Two times a day (BID) | ORAL | Status: AC

## 2011-02-09 MED ORDER — ONDANSETRON HCL 4 MG PO TABS: 4.0000 mg | ORAL_TABLET | Freq: Four times a day (QID) | ORAL | Status: AC

## 2011-02-09 MED ORDER — ONDANSETRON 4 MG PO TBDP
4.0000 mg | ORAL_TABLET | Freq: Once | ORAL | Status: AC
  Administered 2011-02-09: 4 mg via ORAL
  Filled 2011-02-09: qty 1

## 2011-02-09 NOTE — ED Provider Notes (Signed)
History     CSN: [id number] Arrival date & time: 02/09/2011 10:57 AM   First MD Initiated Contact with Patient 02/09/11 1133      Chief Complaint  Patient presents with  . Abdominal Pain  . Chest Pain  . Emesis    (Consider location/radiation/quality/duration/timing/severity/associated sxs/prior treatment) HPI Comments: Pt c/o LLQ pain that is sharpe, an involves her back. She c/o a discomfort in the mid  And left chest when taking a deep breath. No reported fever. No hematuria. No blood in stool. No noted injury in the LLQ area.  Patient is a 35 y.o. female presenting with abdominal pain, chest pain, and vomiting. The history is provided by the patient.  Abdominal Pain The primary symptoms of the illness include abdominal pain and vomiting. The primary symptoms of the illness do not include shortness of breath or dysuria. The current episode started yesterday. The onset of the illness was sudden. The problem has been gradually worsening.  Associated with: unknown. The patient states that she believes she is currently not pregnant. The patient has not had a change in bowel habit. Additional symptoms associated with the illness include back pain. Symptoms associated with the illness do not include anorexia, heartburn, constipation, hematuria or frequency. Significant associated medical issues include GERD. Significant associated medical issues do not include PUD, sickle cell disease, gallstones, liver disease or substance abuse.  Chest Pain Primary symptoms include abdominal pain and vomiting. Pertinent negatives for primary symptoms include no shortness of breath, no cough, no wheezing, no palpitations and no dizziness.  Pertinent negatives for past medical history include no seizures and no sickle cell disease.    Emesis  Associated symptoms include abdominal pain. Pertinent negatives include no arthralgias and no cough.    Past Medical History  Diagnosis Date  . Depression   .  Amenorrhea   . Bipolar 1 disorder   . Dysmenorrhea   . COPD (chronic obstructive pulmonary disease)     Past Surgical History  Procedure Date  . Cholecystectomy   . Tonsillectomy     History reviewed. No pertinent family history.  History  Substance Use Topics  . Smoking status: Current Some Day Smoker -- 0.5 packs/day  . Smokeless tobacco: Not on file  . Alcohol Use: No    OB History    Grav Para Term Preterm Abortions TAB SAB Ect Mult Living   0               Review of Systems  Constitutional: Negative for activity change.       All ROS Neg except as noted in HPI  HENT: Negative for nosebleeds and neck pain.   Eyes: Negative for photophobia and discharge.  Respiratory: Negative for cough, shortness of breath and wheezing.   Cardiovascular: Positive for chest pain. Negative for palpitations.  Gastrointestinal: Positive for vomiting and abdominal pain. Negative for heartburn, constipation, blood in stool and anorexia.  Genitourinary: Negative for dysuria, frequency and hematuria.  Musculoskeletal: Positive for back pain. Negative for arthralgias.  Skin: Negative.   Neurological: Negative for dizziness, seizures and speech difficulty.  Psychiatric/Behavioral: Negative for hallucinations and confusion.    Allergies  Penicillins and Other  Home Medications   Current Outpatient Rx  Name Route Sig Dispense Refill  . ACETAMINOPHEN 500 MG PO TABS Oral Take 500 mg by mouth every 6 (six) hours as needed. Pain     . ARIPIPRAZOLE 5 MG PO TABS Oral Take 5 mg by mouth every morning.     Marland Kitchen  BUSPIRONE HCL 10 MG PO TABS Oral Take 10 mg by mouth 2 (two) times daily. For anxiety    . ESCITALOPRAM OXALATE 20 MG PO TABS Oral Take 20 mg by mouth every morning.      Marland Kitchen MELATONIN PO Oral Take 6 mg by mouth at bedtime as needed. Sleep     . PRESCRIPTION MEDICATION  Patient had an epidural given by Dr. Eduard Clos.       BP 130/65  Pulse 65  Temp(Src) 98 F (36.7 C) (Oral)  Resp 19  Ht  5\' 3"  (1.6 m)  Wt 437 lb (198.222 kg)  BMI 77.41 kg/m2  SpO2 98%  LMP 01/25/2011  Physical Exam  Nursing note and vitals reviewed. Constitutional: She is oriented to person, place, and time. She appears well-developed and well-nourished. She is cooperative.  Non-toxic appearance.       Morbid obesity  HENT:  Head: Normocephalic.  Right Ear: Tympanic membrane and external ear normal.  Left Ear: Tympanic membrane and external ear normal.  Eyes: EOM and lids are normal. Pupils are equal, round, and reactive to light.  Neck: Normal range of motion. Neck supple. Carotid bruit is not present.  Cardiovascular: Normal rate, regular rhythm, normal heart sounds, intact distal pulses and normal pulses.   Pulmonary/Chest: Breath sounds normal. No respiratory distress.  Abdominal: Soft. Bowel sounds are normal. She exhibits no abdominal bruit. There is tenderness in the left lower quadrant. There is CVA tenderness. There is no guarding.  Musculoskeletal: Normal range of motion.  Lymphadenopathy:       Head (right side): No submandibular adenopathy present.       Head (left side): No submandibular adenopathy present.    She has no cervical adenopathy.  Neurological: She is alert and oriented to person, place, and time. She has normal strength. No cranial nerve deficit or sensory deficit.  Skin: Skin is warm and dry.  Psychiatric: She has a normal mood and affect. Her speech is normal.    ED Course: Radiology reports pt exceeds the weight limit for CT scan. Will obtain Acute Abd Xray. Difficulty obtaining IV access. Will try IM meds. Pt feel much improved after IM and PO meds. No vomiting in ED. Plan to strain all urine discussed with pt.  Procedures (including critical care time)  Labs Reviewed  URINALYSIS, ROUTINE W REFLEX MICROSCOPIC - Abnormal; Notable for the following:    Color, Urine AMBER (*) BIOCHEMICALS MAY BE AFFECTED BY COLOR   Appearance CLOUDY (*)    Specific Gravity, Urine >1.030  (*)    Hgb urine dipstick LARGE (*)    Bilirubin Urine SMALL (*)    Ketones, ur TRACE (*)    Protein, ur TRACE (*)    All other components within normal limits  URINE MICROSCOPIC-ADD ON - Abnormal; Notable for the following:    Squamous Epithelial / LPF FEW (*)    Bacteria, UA MANY (*)    Crystals CA OXALATE CRYSTALS (*)    All other components within normal limits  URINE CULTURE   No results found.   No diagnosis found.    MDM  I have reviewed nursing notes, vital signs, and all appropriate lab and imaging results for this patient.        Kathie Dike, [place] 02/10/11 2228

## 2011-02-09 NOTE — ED Notes (Signed)
Pt presents with LLQ and chest pain. Pt states her chest pain occurs when she takes a deep breath and increases upon palpation. Pt also c/o emesis. No active vomiting in triage. NAD at this time.

## 2011-02-10 LAB — URINE CULTURE
Colony Count: 10000
Culture  Setup Time: 201211032231
Special Requests: NORMAL

## 2011-02-11 NOTE — ED Provider Notes (Signed)
Medical screening examination/treatment/procedure(s) were performed by non-physician practitioner and as supervising physician I was immediately available for consultation/collaboration.   Nakai Pollio L Sherree Shankman, MD 02/11/11 0945 

## 2011-04-09 DIAGNOSIS — O009 Unspecified ectopic pregnancy without intrauterine pregnancy: Secondary | ICD-10-CM

## 2011-04-09 HISTORY — DX: Unspecified ectopic pregnancy without intrauterine pregnancy: O00.90

## 2011-04-12 ENCOUNTER — Emergency Department (HOSPITAL_COMMUNITY)
Admission: EM | Admit: 2011-04-12 | Discharge: 2011-04-12 | Disposition: A | Discharge status: Home / Self Care | Payer: Medicare Other | Attending: Emergency Medicine | Admitting: Emergency Medicine

## 2011-04-12 ENCOUNTER — Encounter (HOSPITAL_COMMUNITY): Payer: Self-pay | Admitting: Emergency Medicine

## 2011-04-12 DIAGNOSIS — Z79899 Other long term (current) drug therapy: Secondary | ICD-10-CM | POA: Insufficient documentation

## 2011-04-12 DIAGNOSIS — R1031 Right lower quadrant pain: Secondary | ICD-10-CM | POA: Insufficient documentation

## 2011-04-12 DIAGNOSIS — J4489 Other specified chronic obstructive pulmonary disease: Secondary | ICD-10-CM | POA: Insufficient documentation

## 2011-04-12 DIAGNOSIS — J449 Chronic obstructive pulmonary disease, unspecified: Secondary | ICD-10-CM | POA: Insufficient documentation

## 2011-04-12 DIAGNOSIS — R3915 Urgency of urination: Secondary | ICD-10-CM | POA: Insufficient documentation

## 2011-04-12 DIAGNOSIS — N72 Inflammatory disease of cervix uteri: Secondary | ICD-10-CM

## 2011-04-12 DIAGNOSIS — N898 Other specified noninflammatory disorders of vagina: Secondary | ICD-10-CM | POA: Insufficient documentation

## 2011-04-12 DIAGNOSIS — F3130 Bipolar disorder, current episode depressed, mild or moderate severity, unspecified: Secondary | ICD-10-CM | POA: Insufficient documentation

## 2011-04-12 DIAGNOSIS — R609 Edema, unspecified: Secondary | ICD-10-CM | POA: Insufficient documentation

## 2011-04-12 LAB — BASIC METABOLIC PANEL
BUN: 10 mg/dL (ref 6–23)
Calcium: 9.8 mg/dL (ref 8.4–10.5)
Chloride: 103 mEq/L (ref 96–112)
Creatinine, Ser: 0.61 mg/dL (ref 0.50–1.10)
GFR calc Af Amer: >90 mL/min (ref >90)

## 2011-04-12 LAB — CBC
HCT: 39.2 % (ref 36.0–46.0)
Hemoglobin: 12.2 g/dL (ref 12.0–15.0)
MCH: 27.8 pg (ref 26.0–34.0)
MCHC: 31.1 g/dL (ref 30.0–36.0)
MCV: 89.3 fL (ref 78.0–100.0)
RDW: 14.9 % (ref 11.5–15.5)

## 2011-04-12 LAB — DIFFERENTIAL
Basophils Absolute: 0 10*3/uL (ref 0.0–0.1)
Basophils Relative: 0 % (ref 0–1)
Eosinophils Relative: 4 % (ref 0–5)
Monocytes Absolute: 0.7 10*3/uL (ref 0.1–1.0)
Monocytes Relative: 5 % (ref 3–12)
Neutro Abs: 8.4 10*3/uL — ABNORMAL HIGH (ref 1.7–7.7)

## 2011-04-12 LAB — URINALYSIS, ROUTINE W REFLEX MICROSCOPIC
Bilirubin Urine: NEGATIVE
Glucose, UA: NEGATIVE mg/dL
Hgb urine dipstick: NEGATIVE
Protein, ur: NEGATIVE mg/dL
Urobilinogen, UA: 0.2 mg/dL (ref 0.0–1.0)

## 2011-04-12 MED ORDER — CIPROFLOXACIN HCL 250 MG PO TABS
500.0000 mg | ORAL_TABLET | Freq: Once | ORAL | Status: AC
Start: 2011-04-12 — End: 2011-04-12
  Administered 2011-04-12: 500 mg via ORAL
  Filled 2011-04-12: qty 2

## 2011-04-12 MED ORDER — CEFTRIAXONE SODIUM 250 MG IJ SOLR: 250.0000 mg | Freq: Once | INTRAMUSCULAR | Status: DC

## 2011-04-12 MED ORDER — AZITHROMYCIN 250 MG PO TABS
1000.0000 mg | ORAL_TABLET | Freq: Once | ORAL | Status: AC
  Administered 2011-04-12: 1000 mg via ORAL
  Filled 2011-04-12: qty 4

## 2011-04-12 MED ORDER — NAPROXEN 250 MG PO TABS
500.0000 mg | ORAL_TABLET | Freq: Once | ORAL | Status: AC
  Administered 2011-04-12: 500 mg via ORAL
  Filled 2011-04-12: qty 2

## 2011-04-12 MED ORDER — CIPROFLOXACIN HCL 250 MG PO TABS
ORAL_TABLET | ORAL | Status: AC
  Administered 2011-04-12: 22:00:00
  Filled 2011-04-12: qty 1

## 2011-04-12 MED ORDER — NAPROXEN 500 MG PO TABS: 500.0000 mg | ORAL_TABLET | Freq: Two times a day (BID) | ORAL | Status: DC

## 2011-04-12 MED ORDER — METRONIDAZOLE 500 MG PO TABS: 500.0000 mg | ORAL_TABLET | Freq: Three times a day (TID) | ORAL | Status: AC

## 2011-04-12 NOTE — ED Notes (Signed)
Patient dropped a cipro in the floor and p.Lexis Potenza,rn had to get another one out of [facility]

## 2011-04-12 NOTE — ED Provider Notes (Signed)
History     CSN: [id number]  Arrival date & time 04/12/11  2130   First MD Initiated Contact with Patient 04/12/11 1946      Chief Complaint  Patient presents with  . Abdominal Pain    (Consider location/radiation/quality/duration/timing/severity/associated sxs/prior treatment) Patient is a 36 y.o. female presenting with abdominal pain. The history is provided by the patient.  Abdominal Pain The primary symptoms of the illness include abdominal pain. The current episode started yesterday. The onset of the illness was gradual. The problem has been gradually worsening (Yesterday it was cramping but today it has been more constant.).  The abdominal pain is located in the RLQ. The abdominal pain radiates to the right flank. The abdominal pain is exacerbated by movement (not increased with eating or drinking.  ).  Additional symptoms associated with the illness include urgency. Symptoms associated with the illness do not include chills, anorexia, constipation or frequency.    Past Medical History  Diagnosis Date  . Depression   . Amenorrhea   . Bipolar 1 disorder   . Dysmenorrhea   . COPD (chronic obstructive pulmonary disease)     Past Surgical History  Procedure Date  . Cholecystectomy   . Tonsillectomy     No family history on file.  History  Substance Use Topics  . Smoking status: Former Smoker -- 0.5 packs/day  . Smokeless tobacco: Not on file  . Alcohol Use: No    OB History    Grav Para Term Preterm Abortions TAB SAB Ect Mult Living   0               Review of Systems  Constitutional: Negative for chills.  Gastrointestinal: Positive for abdominal pain. Negative for constipation and anorexia.  Genitourinary: Positive for urgency. Negative for frequency.  All other systems reviewed and are negative.    Allergies  Penicillins and Other  Home Medications   Current Outpatient Rx  Name Route Sig Dispense Refill  . ACETAMINOPHEN 500 MG PO TABS Oral Take  500 mg by mouth every 6 (six) hours as needed. Pain     . ARIPIPRAZOLE 5 MG PO TABS Oral Take 5 mg by mouth every morning.     . BUSPIRONE HCL 10 MG PO TABS Oral Take 10 mg by mouth 2 (two) times daily. For anxiety    . ESCITALOPRAM OXALATE 20 MG PO TABS Oral Take 20 mg by mouth every morning.      Marland Kitchen MELATONIN PO Oral Take 6 mg by mouth at bedtime as needed. Sleep     . PRESCRIPTION MEDICATION  Patient had an epidural given by Dr. Eduard Clos.       BP 148/84  Pulse 87  Temp(Src) 97.4 F (36.3 C) (Oral)  Resp 20  Ht 5\' 3"  (1.6 m)  Wt 437 lb (198.222 kg)  BMI 77.41 kg/m2  SpO2 100%  LMP 03/29/2011  Physical Exam  Nursing note and vitals reviewed. Constitutional: No distress.       Morbidly obese   HENT:  Head: Normocephalic and atraumatic.  Right Ear: External ear normal.  Left Ear: External ear normal.  Eyes: Conjunctivae are normal. Right eye exhibits no discharge. Left eye exhibits no discharge. No scleral icterus.  Neck: Neck supple. No tracheal deviation present.  Cardiovascular: Normal rate, regular rhythm and intact distal pulses.   Pulmonary/Chest: Effort normal and breath sounds normal. No stridor. No respiratory distress. She has no wheezes. She has no rales.  Abdominal: Soft. Bowel sounds are  normal. She exhibits no distension and no mass. There is no tenderness. There is no rebound and no guarding.       Exam limited by body habitus  Genitourinary: There is no rash on the right labia. There is no rash on the left labia. Cervix exhibits discharge. There is tenderness around the vagina. No signs of injury around the vagina. Vaginal discharge found.       Exam limited by body habitus, unable to perform complete pelvic despite assistance  Musculoskeletal: She exhibits edema. She exhibits no tenderness.  Neurological: She is alert. She has normal strength. No sensory deficit. Cranial nerve deficit:  no gross defecits noted. She exhibits normal muscle tone. She displays no  seizure activity. Coordination normal.  Skin: Skin is warm and dry. No rash noted.  Psychiatric: She has a normal mood and affect.    ED Course  Procedures (including critical care time)  Labs Reviewed  URINALYSIS, ROUTINE W REFLEX MICROSCOPIC - Abnormal; Notable for the following:    Ketones, ur TRACE (*)    All other components within normal limits  CBC - Abnormal; Notable for the following:    WBC 12.9 (*)    All other components within normal limits  DIFFERENTIAL - Abnormal; Notable for the following:    Neutro Abs 8.4 (*)    All other components within normal limits  BASIC METABOLIC PANEL - Abnormal; Notable for the following:    Glucose, Bld 125 (*)    All other components within normal limits  PREGNANCY, URINE  GC/CHLAMYDIA PROBE AMP, GENITAL  WET PREP, GENITAL   No results found.   No diagnosis found.    MDM  Patient's exam limited by body habitus. However, I doubt appendicitis as the symptoms were not suggestive of that. She's had normal appetite and did not have abdominal tenderness on exam. Patient did have discomfort and irritation with the pelvic exam. There did appear to be some discharge and my limited exam. I will treat her empirically for cervicitis/vaginitis. I've asked her to return to emergency room for recheck if her symptoms are not improving. She is encouraged to followup with her gynecologist next week regardless.        Celene Kras, MD 04/12/11 2121

## 2011-04-12 NOTE — ED Notes (Signed)
Patient c/o RLQ pain since yesterday.  Denies nausea, vomiting or diarrhea.

## 2011-05-03 ENCOUNTER — Ambulatory Visit: Payer: Medicare Other | Admitting: Cardiovascular Disease

## 2011-05-14 ENCOUNTER — Ambulatory Visit (INDEPENDENT_AMBULATORY_CARE_PROVIDER_SITE_OTHER): Payer: Medicare Other | Admitting: Cardiovascular Disease

## 2011-05-14 ENCOUNTER — Encounter: Payer: Self-pay | Admitting: *Deleted

## 2011-05-14 ENCOUNTER — Encounter: Payer: Self-pay | Admitting: Cardiovascular Disease

## 2011-05-14 DIAGNOSIS — I10 Essential (primary) hypertension: Secondary | ICD-10-CM

## 2011-05-14 DIAGNOSIS — E785 Hyperlipidemia, unspecified: Secondary | ICD-10-CM

## 2011-05-14 DIAGNOSIS — Z01810 Encounter for preprocedural cardiovascular examination: Secondary | ICD-10-CM

## 2011-05-14 NOTE — Assessment & Plan Note (Signed)
Cholesterol is at goal.  Continue current dose of statin and diet Rx.  No myalgias or side effects.  F/U  LFT's in 6 months. Lab Results  Component Value Date   LDLCALC 54 06/30/2008

## 2011-05-14 NOTE — Patient Instructions (Signed)
Your physician recommends that you schedule a follow-up appointment in: AS NEEDED  Your physician recommends that you continue on your current medications as directed. Please refer to the Current Medication list given to you today.  

## 2011-05-14 NOTE — Assessment & Plan Note (Signed)
No documented cardiac issues.  Put to sleep twice previously without complication.  ECG is normal  Her biggest risk of anesthesia is her obesity and airway protection not her heart

## 2011-05-14 NOTE — Progress Notes (Signed)
Referred by Dr Cindee Lame for cadiac clearance for tooth extraction with Dr Barbette Merino. Last seen in 2011 for palpitations and PAC;s.  No history of cardiac problems" She is morbidly obese and depressed. Apparantly she has been turned down for bariatric surgery by [facility] and [facility]. There are issues with her eating pattern being related to depression. Her dyspnea is functional there is no history of DCM, valvular disease or MI She is too large for any diagnositic testing except possible limited echo to assess EF. She is a nonsmoker with no chronic lung disease.  She had GB surgery in 2001 with general anesthesia and wisdom teeth pulled with anesthesia less than 5 years ago with no complications.    ROS: Denies fever, malais, weight loss, blurry vision, decreased visual acuity, cough, sputum, , hemoptysis, pleuritic pain, palpitaitons, heartburn, abdominal pain, melena, lower extremity edema, claudication, or rash.  All other systems reviewed and negative   General: Affect appropriate Obese white female HEENT: normal Neck supple with no adenopathy JVP normal no bruits no thyromegaly Lungs clear with no wheezing and good diaphragmatic motion Heart:  S1/S2 no murmur,rub, gallop or click PMI normal Abdomen: benighn, BS positve, no tenderness, no AAA no bruit.  No HSM or HJR Distal pulses intact with no bruits Trace edema Neuro non-focal Skin warm and dry No muscular weakness  Medications Current Outpatient Prescriptions  Medication Sig Dispense Refill  . acetaminophen (TYLENOL) 500 MG tablet Take 500 mg by mouth every 6 (six) hours as needed. Pain       . ARIPiprazole (ABILIFY) 5 MG tablet Take 5 mg by mouth every morning.       . busPIRone (BUSPAR) 10 MG tablet Take 10 mg by mouth 2 (two) times daily. For anxiety      . escitalopram (LEXAPRO) 20 MG tablet Take 20 mg by mouth every morning.        . Nutritional Supplements (MELATONIN PO) Take 6 mg by mouth at bedtime as needed. Sleep          Allergies Penicillins and Other  Family History: No family history on file.  Social History: History   Social History  . Marital Status: Single    Spouse Name: N/A    Number of Children: N/A  . Years of Education: N/A   Occupational History  . Not on file.   Social History Main Topics  . Smoking status: Former Smoker -- 0.5 packs/day  . Smokeless tobacco: Not on file  . Alcohol Use: No  . Drug Use: No  . Sexually Active:    Other Topics Concern  . Not on file   Social History Narrative  . No narrative on file    Electrocardiogram:  02/11/11  NSR rate 65 normal ECG  Assessment and Plan

## 2011-05-14 NOTE — Assessment & Plan Note (Signed)
F/U pscyiatric counseling as eating disorder relates to depression and bipolar disease.

## 2011-05-14 NOTE — Assessment & Plan Note (Signed)
Well controlled.  Continue current medications and low sodium Dash type diet.    

## 2011-06-27 ENCOUNTER — Other Ambulatory Visit (HOSPITAL_COMMUNITY): Payer: Self-pay | Admitting: *Deleted

## 2011-06-27 ENCOUNTER — Encounter (HOSPITAL_COMMUNITY): Payer: Self-pay | Admitting: Pharmacy Technician

## 2011-06-27 NOTE — H&P (Signed)
HISTORY AND PHYSICAL  Heather [name] is a 36 y.o. female patient with CC: Painful teeth  No diagnosis found.  Past Medical History  Diagnosis Date  . Depression   . Amenorrhea   . Bipolar 1 disorder   . Dysmenorrhea   . COPD (chronic obstructive pulmonary disease)     No current facility-administered medications for this encounter.   Current Outpatient Prescriptions  Medication Sig Dispense Refill  . acetaminophen (TYLENOL) 500 MG tablet Take 500 mg by mouth every 6 (six) hours as needed. Pain       . ARIPiprazole (ABILIFY) 5 MG tablet Take 5 mg by mouth every morning.       . busPIRone (BUSPAR) 10 MG tablet Take 10 mg by mouth 2 (two) times daily. For anxiety      . escitalopram (LEXAPRO) 20 MG tablet Take 20 mg by mouth every morning.        . Nutritional Supplements (MELATONIN PO) Take 6 mg by mouth at bedtime as needed. Sleep        Allergies  Allergen Reactions  . Penicillins Anaphylaxis    REACTION: Angioedema  . Other Swelling    Bee stings   Active Problems:  * No active hospital problems. *   Vitals: There were no vitals taken for this visit. Lab results:No results found for this or any previous visit (from the past 24 hour(s)). Radiology Results: No results found. General appearance: alert, cooperative, no distress and morbidly obese Head: Normocephalic, without obvious abnormality, atraumatic Eyes: negative Ears: normal TM's and external ear canals both ears Nose: Nares normal. Septum midline. Mucosa normal. No drainage or sinus tenderness. Throat: dental caries teeth #'s 1, 2, 3, 4, 11, 14, 18, 23, 24, 25, 26, 31 Neck: no adenopathy, no carotid bruit, no JVD, supple, symmetrical, trachea midline and thyroid not enlarged, symmetric, no tenderness/mass/nodules Resp: clear to auscultation bilaterally Cardio: regular rate and rhythm, S1, S2 normal, no murmur, click, rub or gallop  Assessment: 35YOWF Morbid obesity, COPD, Depression, Bipolar, with  non-restorable  teeth #'s 1, 2, 3, 4, 11, 14, 18, 23, 24, 25, 26, 31.  Plan: Extraction  teeth #'s 1, 2, 3, 4, 11, 14, 18, 23, 24, 25, 26, 31, alveoloplasty. General anesthesia. Day surgery.   [name] 06/27/2011

## 2011-06-28 ENCOUNTER — Encounter (HOSPITAL_COMMUNITY): Payer: Self-pay

## 2011-06-28 ENCOUNTER — Encounter (HOSPITAL_COMMUNITY): Payer: Self-pay | Admitting: Vascular Surgery

## 2011-06-28 ENCOUNTER — Encounter (HOSPITAL_COMMUNITY)
Admission: RE | Admit: 2011-06-28 | Discharge: 2011-06-28 | Disposition: A | Discharge status: Home / Self Care | Payer: Medicare Other | Source: Ambulatory Visit | Attending: Oral Surgery | Admitting: Oral Surgery

## 2011-06-28 DIAGNOSIS — Z01818 Encounter for other preprocedural examination: Secondary | ICD-10-CM | POA: Insufficient documentation

## 2011-06-28 HISTORY — DX: Shortness of breath: R06.02

## 2011-06-28 HISTORY — DX: Gastro-esophageal reflux disease without esophagitis: K21.9

## 2011-06-28 HISTORY — DX: Myoneural disorder, unspecified: G70.9

## 2011-06-28 HISTORY — DX: Cardiac arrhythmia, unspecified: I49.9

## 2011-06-28 HISTORY — DX: Sleep apnea, unspecified: G47.30

## 2011-06-28 LAB — BASIC METABOLIC PANEL
BUN: 10 mg/dL (ref 6–23)
Creatinine, Ser: 0.52 mg/dL (ref 0.50–1.10)
GFR calc Af Amer: >90 mL/min (ref >90)
GFR calc non Af Amer: >90 mL/min (ref >90)
Potassium: 4.1 mEq/L (ref 3.5–5.1)

## 2011-06-28 LAB — CBC
Hemoglobin: 12.3 g/dL (ref 12.0–15.0)
MCHC: 31.7 g/dL (ref 30.0–36.0)
Platelets: 319 10*3/uL (ref 150–400)
RDW: 15 % (ref 11.5–15.5)

## 2011-06-28 LAB — HCG, SERUM, QUALITATIVE: Preg, Serum: POSITIVE — AB

## 2011-06-28 NOTE — Pre-Procedure Instructions (Signed)
22 Heather [name]  06/28/2011   Your procedure is scheduled on:  Monday  07/01/11   Report to [facility] at 645 AM.  Call this number if you have problems the morning of surgery: [phone]   Remember:   Do not eat food:After Midnight.  May have clear liquids: up to 4 Hours before arrival.  Clear liquids include soda, tea, black coffee, apple or grape juice, broth.  Take these medicines the morning of surgery with A SIP OF WATER: TYLENOL, LEXAPRO, BUSPAR, ABILIFY    Do not wear jewelry, make-up or nail polish.  Do not wear lotions, powders, or perfumes. You may wear deodorant.  Do not shave 48 hours prior to surgery.  Do not bring valuables to the hospital.  Contacts, dentures or bridgework may not be worn into surgery.  Leave suitcase in the car. After surgery it may be brought to your room.  For patients admitted to the hospital, checkout time is 11:00 AM the day of discharge.   Patients discharged the day of surgery will not be allowed to drive home.  Name and phone number of your driver:   Special Instructions: CHG Shower Use Special Wash: 1/2 bottle night before surgery and 1/2 bottle morning of surgery.   Please read over the following fact sheets that you were given: Pain Booklet, MRSA Information and Surgical Site Infection Prevention

## 2011-06-28 NOTE — Consult Note (Addendum)
Anesthesia:  Patient is a 36 year old female scheduled for dental extraction for 07/01/11.  However, today's serum pregnancy test was positive.  I notified Dr. Barbette Merino who will contact the patient.  He plans to cancel her procedure, as it is elective.  I have notified OR scheduling.

## 2011-06-29 ENCOUNTER — Encounter (HOSPITAL_COMMUNITY): Payer: Self-pay | Admitting: Emergency Medicine

## 2011-06-29 ENCOUNTER — Emergency Department (HOSPITAL_COMMUNITY): Payer: Medicare Other

## 2011-06-29 ENCOUNTER — Other Ambulatory Visit: Payer: Self-pay

## 2011-06-29 ENCOUNTER — Emergency Department (HOSPITAL_COMMUNITY)
Admission: EM | Admit: 2011-06-29 | Discharge: 2011-06-29 | Disposition: A | Discharge status: Home / Self Care | Payer: Medicare Other | Attending: Emergency Medicine | Admitting: Emergency Medicine

## 2011-06-29 DIAGNOSIS — J4489 Other specified chronic obstructive pulmonary disease: Secondary | ICD-10-CM | POA: Insufficient documentation

## 2011-06-29 DIAGNOSIS — Z79899 Other long term (current) drug therapy: Secondary | ICD-10-CM | POA: Insufficient documentation

## 2011-06-29 DIAGNOSIS — N898 Other specified noninflammatory disorders of vagina: Secondary | ICD-10-CM | POA: Insufficient documentation

## 2011-06-29 DIAGNOSIS — R071 Chest pain on breathing: Secondary | ICD-10-CM | POA: Insufficient documentation

## 2011-06-29 DIAGNOSIS — Z331 Pregnant state, incidental: Secondary | ICD-10-CM

## 2011-06-29 DIAGNOSIS — R0602 Shortness of breath: Secondary | ICD-10-CM | POA: Insufficient documentation

## 2011-06-29 DIAGNOSIS — F3130 Bipolar disorder, current episode depressed, mild or moderate severity, unspecified: Secondary | ICD-10-CM | POA: Insufficient documentation

## 2011-06-29 DIAGNOSIS — R109 Unspecified abdominal pain: Secondary | ICD-10-CM | POA: Insufficient documentation

## 2011-06-29 DIAGNOSIS — J449 Chronic obstructive pulmonary disease, unspecified: Secondary | ICD-10-CM | POA: Insufficient documentation

## 2011-06-29 DIAGNOSIS — M549 Dorsalgia, unspecified: Secondary | ICD-10-CM | POA: Insufficient documentation

## 2011-06-29 DIAGNOSIS — O2390 Unspecified genitourinary tract infection in pregnancy, unspecified trimester: Secondary | ICD-10-CM | POA: Insufficient documentation

## 2011-06-29 DIAGNOSIS — N390 Urinary tract infection, site not specified: Secondary | ICD-10-CM | POA: Insufficient documentation

## 2011-06-29 DIAGNOSIS — K219 Gastro-esophageal reflux disease without esophagitis: Secondary | ICD-10-CM | POA: Insufficient documentation

## 2011-06-29 LAB — BASIC METABOLIC PANEL
CO2: 29 mEq/L (ref 19–32)
Chloride: 101 mEq/L (ref 96–112)
Creatinine, Ser: 0.61 mg/dL (ref 0.50–1.10)
Glucose, Bld: 97 mg/dL (ref 70–99)

## 2011-06-29 LAB — CBC
HCT: 38.2 % (ref 36.0–46.0)
Hemoglobin: 11.8 g/dL — ABNORMAL LOW (ref 12.0–15.0)
MCH: 26.8 pg (ref 26.0–34.0)
MCV: 86.6 fL (ref 78.0–100.0)
Platelets: 329 10*3/uL (ref 150–400)
RBC: 4.41 MIL/uL (ref 3.87–5.11)
WBC: 10.5 10*3/uL (ref 4.0–10.5)

## 2011-06-29 LAB — URINALYSIS, ROUTINE W REFLEX MICROSCOPIC
Ketones, ur: NEGATIVE mg/dL
Nitrite: NEGATIVE
Protein, ur: NEGATIVE mg/dL
Urobilinogen, UA: 0.2 mg/dL (ref 0.0–1.0)

## 2011-06-29 LAB — TYPE AND SCREEN: ABO/RH(D): B POS

## 2011-06-29 LAB — DIFFERENTIAL
Eosinophils Relative: 3 % (ref 0–5)
Lymphocytes Relative: 28 % (ref 12–46)
Lymphs Abs: 3 10*3/uL (ref 0.7–4.0)
Monocytes Absolute: 0.6 10*3/uL (ref 0.1–1.0)
Monocytes Relative: 5 % (ref 3–12)

## 2011-06-29 LAB — POCT PREGNANCY, URINE: Preg Test, Ur: NEGATIVE

## 2011-06-29 LAB — HCG, QUANTITATIVE, PREGNANCY: hCG, Beta Chain, Quant, S: 53 m[IU]/mL — ABNORMAL HIGH (ref <5)

## 2011-06-29 LAB — URINE MICROSCOPIC-ADD ON

## 2011-06-29 MED ORDER — NITROFURANTOIN MONOHYD MACRO 100 MG PO CAPS: 100.0000 mg | ORAL_CAPSULE | Freq: Two times a day (BID) | ORAL | Status: AC

## 2011-06-29 MED ORDER — PRENATAL COMPLETE 14-0.4 MG PO TABS: 1.0000 | ORAL_TABLET | Freq: Every day | ORAL | Status: DC

## 2011-06-29 NOTE — ED Notes (Signed)
Ultrasound Tech to be called in by xray Dept.

## 2011-06-29 NOTE — ED Notes (Signed)
Pt c/o sob, abd pain and vaginal bleeding since this am.

## 2011-06-29 NOTE — ED Notes (Signed)
Patient with no complaints at this time. Respirations even and unlabored. Skin warm/dry. Discharge instructions reviewed with patient at this time. Patient given opportunity to voice concerns/ask questions. Patient discharged at this time and left Emergency Department via wheelchair. 

## 2011-06-29 NOTE — ED Provider Notes (Signed)
History   Scribed for Heather Gaskins, MD, the patient was seen in [facility]. The chart was scribed by Gilman Schmidt. The patients care was started at 2:05 PM.   CSN: [id number]  Arrival date & time 06/29/11  1331   First MD Initiated Contact with Patient 06/29/11 1345      Chief Complaint  Patient presents with  . Shortness of Breath  . Abdominal Pain  . Vaginal Bleeding     HPI Heather [name] is a 36 y.o. female who presents to the Emergency Department complaining of abdominal pain onset this am. Also notes vaginal discharge and light vaginal bleeding.  no other alleviating or aggravating factors.  She also reports chest wall pain and SOB - but she admits this is not a new phenomenon and she has had this previously.  It is not worsened from prior episodes  Past Medical History  Diagnosis Date  . Amenorrhea   . Bipolar 1 disorder   . Dysmenorrhea   . COPD (chronic obstructive pulmonary disease)   . Dysrhythmia     DR Eden Emms    . Asthma   . Sleep apnea     CPAP  . Shortness of breath     WITH EXERTION   . GERD (gastroesophageal reflux disease)     HEARTBURN   TUMS  . Neuromuscular disorder     RESTLESS LEG   . Depression     Past Surgical History  Procedure Date  . Cholecystectomy   . Tonsillectomy     History reviewed. No pertinent family history.  History  Substance Use Topics  . Smoking status: Former Smoker -- 0.5 packs/day  . Smokeless tobacco: Not on file  . Alcohol Use: No    OB History    Grav Para Term Preterm Abortions TAB SAB Ect Mult Living   0               Review of Systems  Respiratory: Positive for shortness of breath.   Cardiovascular: Positive for chest pain.  Gastrointestinal: Positive for abdominal pain.  Genitourinary: Positive for vaginal bleeding and vaginal discharge.  Musculoskeletal: Positive for back pain.  All other systems reviewed and are negative.    Allergies  Penicillins and Other  Home Medications    Current Outpatient Rx  Name Route Sig Dispense Refill  . ACETAMINOPHEN 500 MG PO TABS Oral Take 500 mg by mouth every 6 (six) hours as needed. Pain    . ARIPIPRAZOLE 5 MG PO TABS Oral Take 5 mg by mouth every morning.     . BUSPIRONE HCL 10 MG PO TABS Oral Take 10 mg by mouth 2 (two) times daily. For anxiety    . ESCITALOPRAM OXALATE 20 MG PO TABS Oral Take 20 mg by mouth every morning.     Marland Kitchen MELATONIN 5 MG PO TABS Oral Take 1 tablet by mouth at bedtime as needed. For sleep      BP 139/76  Pulse 77  Temp 97.4 F (36.3 C)  Resp 20  Ht 5\' 3"  (1.6 m)  Wt 468 lb (212.283 kg)  BMI 82.90 kg/m2  SpO2 100%  LMP 06/29/2011  Physical Exam CONSTITUTIONAL: Well developed/well nourished HEAD AND FACE: Normocephalic/atraumatic EYES: EOMI/PERRL ENMT: Mucous membranes moist NECK: supple no meningeal signs SPINE:entire spine nontender CV: S1/S2 noted, no murmurs/rubs/gallops noted Chest - tender to palpation LUNGS: Lungs are clear to auscultation bilaterally, no apparent distress ABDOMEN: soft, nontender, no rebound or guarding.  obese GU:no cva tenderness.  Small amt of vag bleeding noted. No cmt noted.  Limited due to habitus.  Chaperone present NEURO: Pt is awake/alert, moves all extremitiesx4 EXTREMITIES: pulses normal, full ROM SKIN: warm, color normal PSYCH: no abnormalities of mood noted   ED Course  Procedures    Labs Reviewed  URINALYSIS, ROUTINE W REFLEX MICROSCOPIC   DIAGNOSTIC STUDIES: Oxygen Saturation is 100% on room air, normal by my interpretation.    COORDINATION OF CARE: 2:05pm:  - Patient evaluated by ED physician, CBC, Diff, BMP, hCG, UA, POCT, EKG ordered Review of chart reveals recent +pregnancy test, but urine pregnancy negative, awaiting HCG quant Chest wall tender to palpation, reports SOB at baseline, will defer further testing she has had cardiology eval previously, doubt ACS/PE at this time  HCG less than 100 Pt well appearing denies abd pain at  this time Quant too low for [place] D/w gyn (dr Emelda Fear, requests f/u in 48 hrs for quant recheck) Pt agreeable Discussed strict return precautions   The patient appears reasonably screened and/or stabilized for discharge and I doubt any other medical condition or other [facility] requiring further screening, evaluation, or treatment in the ED at this time prior to discharge.    MDM  Nursing notes reviewed and considered in documentation All labs/vitals reviewed and considered Previous records reviewed and considered     Date: 06/29/2011  Rate: 80  Rhythm: normal sinus rhythm  QRS Axis: normal  Intervals: normal  ST/T Wave abnormalities: normal  Conduction Disutrbances:none     I personally performed the services described in this documentation, which was scribed in my presence. The recorded information has been reviewed and considered.           Heather Gaskins, MD 06/29/11 [phone]

## 2011-07-01 ENCOUNTER — Ambulatory Visit (HOSPITAL_COMMUNITY): Admission: RE | Admit: 2011-07-01 | Payer: Medicare Other | Source: Ambulatory Visit | Admitting: Oral Surgery

## 2011-07-01 ENCOUNTER — Encounter (HOSPITAL_COMMUNITY): Admission: RE | Payer: Self-pay | Source: Ambulatory Visit

## 2011-07-01 LAB — URINE CULTURE
Colony Count: >=100000
Culture  Setup Time: 201303242310

## 2011-07-01 SURGERY — DENTAL RESTORATION/EXTRACTIONS
Anesthesia: General | Laterality: Bilateral

## 2011-07-04 ENCOUNTER — Emergency Department (HOSPITAL_COMMUNITY)
Admission: EM | Admit: 2011-07-04 | Discharge: 2011-07-04 | Disposition: A | Discharge status: Home / Self Care | Payer: Medicare Other | Attending: Emergency Medicine | Admitting: Emergency Medicine

## 2011-07-04 ENCOUNTER — Emergency Department (HOSPITAL_COMMUNITY): Payer: Medicare Other

## 2011-07-04 ENCOUNTER — Encounter (HOSPITAL_COMMUNITY): Payer: Self-pay | Admitting: Emergency Medicine

## 2011-07-04 DIAGNOSIS — F319 Bipolar disorder, unspecified: Secondary | ICD-10-CM | POA: Insufficient documentation

## 2011-07-04 DIAGNOSIS — J4489 Other specified chronic obstructive pulmonary disease: Secondary | ICD-10-CM | POA: Insufficient documentation

## 2011-07-04 DIAGNOSIS — R05 Cough: Secondary | ICD-10-CM

## 2011-07-04 DIAGNOSIS — Z79899 Other long term (current) drug therapy: Secondary | ICD-10-CM | POA: Insufficient documentation

## 2011-07-04 DIAGNOSIS — J449 Chronic obstructive pulmonary disease, unspecified: Secondary | ICD-10-CM | POA: Insufficient documentation

## 2011-07-04 DIAGNOSIS — K219 Gastro-esophageal reflux disease without esophagitis: Secondary | ICD-10-CM | POA: Insufficient documentation

## 2011-07-04 DIAGNOSIS — R059 Cough, unspecified: Secondary | ICD-10-CM | POA: Insufficient documentation

## 2011-07-04 MED ORDER — IPRATROPIUM BROMIDE 0.02 % IN SOLN
0.5000 mg | Freq: Once | RESPIRATORY_TRACT | Status: AC
  Administered 2011-07-04: 0.5 mg via RESPIRATORY_TRACT
  Filled 2011-07-04: qty 2.5

## 2011-07-04 MED ORDER — ALBUTEROL SULFATE HFA 108 (90 BASE) MCG/ACT IN AERS
1.0000 | INHALATION_SPRAY | Freq: Four times a day (QID) | RESPIRATORY_TRACT | Status: DC | PRN
Start: 2011-07-04 — End: 2011-07-31

## 2011-07-04 MED ORDER — ALBUTEROL SULFATE (5 MG/ML) 0.5% IN NEBU
5.0000 mg | INHALATION_SOLUTION | Freq: Once | RESPIRATORY_TRACT | Status: AC
  Administered 2011-07-04: 5 mg via RESPIRATORY_TRACT
  Filled 2011-07-04: qty 1

## 2011-07-04 NOTE — Discharge Instructions (Signed)
Cough, Adult  A cough is a reflex. It helps you clear your throat and airways. A cough can help heal your body. A cough can last 2 or 3 weeks (acute) or may last more than 8 weeks (chronic). Some common causes of a cough can include an infection, allergy, or a cold. HOME CARE  Only take medicine as told by your doctor.   If given, take your medicines (antibiotics) as told. Finish them even if you start to feel better.   Use a cold steam vaporizer or humidier in your home. This can help loosen thick spit (secretions).   Sleep so you are almost sitting up (semi-upright). Use pillows to do this. This helps reduce coughing.   Rest as needed.   Stop smoking if you smoke.  GET HELP RIGHT AWAY IF:  You have yellowish-white fluid (pus) in your thick spit.   Your cough gets worse.   Your medicine does not reduce coughing, and you are losing sleep.   You cough up blood.   You have trouble breathing.   Your pain gets worse and medicine does not help.   You have a fever.  MAKE SURE YOU:   Understand these instructions.   Will watch your condition.   Will get help right away if you are not doing well or get worse.  Document Released: 12/06/2010 Document Revised: 03/14/2011 Document Reviewed: 12/06/2010 [employer] Patient Information 2012 [employer]. 

## 2011-07-04 NOTE — ED Notes (Signed)
Pt here yesterday and dx with miscarriage. States still having some lower abd pain with that and bleeding some. Pt c/o cough since yesterday with chills. Cough worse this am. Nonproductive. No resp distress noted. Nad. Color wnl.

## 2011-07-04 NOTE — ED Provider Notes (Addendum)
History   This chart was scribed for Heather Gaskins, MD by Brooks Sailors. The patient was seen in room APA16A/APA16A. Patient's care was started at 1019.   CSN: [id number]  Arrival date & time 07/04/11  1019   First MD Initiated Contact with Patient 07/04/11 1024      Chief Complaint  Patient presents with  . Cough     HPI  Heather [name] is a 36 y.o. female who presents to the Emergency Department complaining of constant moderate cough onset yesterday and persistent since with associated mild abdominal soreness and cramping, SOB, and chills.  States cough causes a pressure pain in chest and SOB is aggravated by exertion. Denies vomiting and fevers. Patient also notes having persistent vaginal bleeding. Patient had recent miscarriage. Patient h/o of COPD, SOB, GERD, Depression. Recently seen by Dr. Emelda Fear with OBGYN  Past Medical History  Diagnosis Date  . Amenorrhea   . Bipolar 1 disorder   . Dysmenorrhea   . COPD (chronic obstructive pulmonary disease)   . Dysrhythmia     DR Eden Emms    . Asthma   . Sleep apnea     CPAP  . Shortness of breath     WITH EXERTION   . GERD (gastroesophageal reflux disease)     HEARTBURN   TUMS  . Neuromuscular disorder     RESTLESS LEG   . Depression     Past Surgical History  Procedure Date  . Cholecystectomy   . Tonsillectomy     History reviewed. No pertinent family history.  History  Substance Use Topics  . Smoking status: Former Smoker -- 0.5 packs/day  . Smokeless tobacco: Not on file  . Alcohol Use: No    OB History    Grav Para Term Preterm Abortions TAB SAB Ect Mult Living   0               Review of Systems A complete 10 system review of systems was obtained and all systems are negative except as noted in the HPI and PMH.   Allergies  Bee and Penicillins  Home Medications   Current Outpatient Rx  Name Route Sig Dispense Refill  . ACETAMINOPHEN 500 MG PO TABS Oral Take 500 mg by mouth every 6 (six)  hours as needed. Pain    . ARIPIPRAZOLE 5 MG PO TABS Oral Take 5 mg by mouth daily.     . BUSPIRONE HCL 10 MG PO TABS Oral Take 10 mg by mouth 2 (two) times daily. For anxiety    . ESCITALOPRAM OXALATE 20 MG PO TABS Oral Take 20 mg by mouth daily.     Marland Kitchen MELATONIN 5 MG PO TABS Oral Take 1 tablet by mouth at bedtime as needed. For sleep    . NITROFURANTOIN MONOHYD MACRO 100 MG PO CAPS Oral Take 1 capsule (100 mg total) by mouth 2 (two) times daily. 10 capsule 0  . PRENATAL COMPLETE 14-0.4 MG PO TABS Oral Take 1 tablet by mouth daily. 60 each 0    BP 109/57  Pulse 77  Temp(Src) 97.7 F (36.5 C) (Axillary)  Resp 20  Ht 5\' 3"  (1.6 m)  Wt 460 lb (208.655 kg)  BMI 81.49 kg/m2  SpO2 100%  LMP 06/29/2011  Physical Exam Exam limited due to body habitus CONSTITUTIONAL: Well developed/well nourished Obese  HEAD AND FACE: Normocephalic/atraumatic EYES: EOMI/PERRL ENMT: Mucous membranes moist NECK: supple no meningeal signs SPINE:entire spine nontender CV: S1/S2 noted, no murmurs/rubs/gallops noted.  Limited due to size.  LUNGS: Lungs are clear to auscultation bilaterally, no apparent distress. Limited due to size  ABDOMEN: soft, nontender, no rebound or guarding  NEURO: Pt is awake/alert, moves all extremitiesx4 EXTREMITIES: pulses normal, full ROM, DP and PT pulses intact Chronic symmetric edema noted to LE SKIN: warm, color normal PSYCH: no abnormalities of mood noted    ED Course  Procedures  DIAGNOSTIC STUDIES: Oxygen Saturation is 100% on room air, normal by my interpretation.    COORDINATION OF CARE: 10:30AM Patient informed of current plan for treatment and evaluation and agrees with plan at this time.  Ordering chest x-ray      Labs Reviewed - No data to display Dg Chest 2 View  07/04/2011  *RADIOLOGY REPORT*  Clinical Data: COPD, shortness of breath  CHEST - 2 VIEW  Comparison: November 24, 2010  Findings: The cardiac silhouette, mediastinum, pulmonary vasculature are  within normal limits.  Both lungs are clear. There is no acute bony abnormality.  IMPRESSION: There is no evidence of acute cardiac or pulmonary process.  Original Report Authenticated By: Brandon Melnick, M.D.   11:06 AM Pt wit recent miscarriage, still have some light vag bleeding abd soft on my exam She reports cough but CXR clear at this time I doubt PE at this time.  Suspicion for CHF is low  Discussed CXR results Stable for d/c     MDM  Nursing notes reviewed and considered in documentation Previous records reviewed and considered xrays reviewed and considered    I personally performed the services described in this documentation, which was scribed in my presence. The recorded information has been reviewed and considered.         Heather Gaskins, MD 07/04/11 1359  Heather Gaskins, MD 07/04/11 1359

## 2011-07-19 ENCOUNTER — Encounter (HOSPITAL_COMMUNITY): Payer: Self-pay | Admitting: *Deleted

## 2011-07-19 DIAGNOSIS — R109 Unspecified abdominal pain: Secondary | ICD-10-CM | POA: Insufficient documentation

## 2011-07-19 DIAGNOSIS — J4489 Other specified chronic obstructive pulmonary disease: Secondary | ICD-10-CM | POA: Insufficient documentation

## 2011-07-19 DIAGNOSIS — F319 Bipolar disorder, unspecified: Secondary | ICD-10-CM | POA: Insufficient documentation

## 2011-07-19 DIAGNOSIS — O039 Complete or unspecified spontaneous abortion without complication: Secondary | ICD-10-CM | POA: Insufficient documentation

## 2011-07-19 DIAGNOSIS — J449 Chronic obstructive pulmonary disease, unspecified: Secondary | ICD-10-CM | POA: Insufficient documentation

## 2011-07-19 DIAGNOSIS — G4730 Sleep apnea, unspecified: Secondary | ICD-10-CM | POA: Insufficient documentation

## 2011-07-19 DIAGNOSIS — Z79899 Other long term (current) drug therapy: Secondary | ICD-10-CM | POA: Insufficient documentation

## 2011-07-19 NOTE — ED Notes (Signed)
Patient with acute right side pain, around the hip and flank area to her abdomen.  Patient had an ultra sound today done for she is experiencing miscarriage.  Patient also states that she has had n/v/d for the past few days.  Patient is currently having some vaginal bleeding

## 2011-07-20 ENCOUNTER — Emergency Department (HOSPITAL_COMMUNITY)
Admission: EM | Admit: 2011-07-20 | Discharge: 2011-07-20 | Disposition: A | Discharge status: Home / Self Care | Payer: Medicare Other | Attending: Emergency Medicine | Admitting: Emergency Medicine

## 2011-07-20 DIAGNOSIS — O039 Complete or unspecified spontaneous abortion without complication: Secondary | ICD-10-CM

## 2011-07-20 LAB — COMPREHENSIVE METABOLIC PANEL
Albumin: 3.3 g/dL — ABNORMAL LOW (ref 3.5–5.2)
Alkaline Phosphatase: 66 U/L (ref 39–117)
BUN: 13 mg/dL (ref 6–23)
Calcium: 9.3 mg/dL (ref 8.4–10.5)
Creatinine, Ser: 0.68 mg/dL (ref 0.50–1.10)
GFR calc Af Amer: >90 mL/min (ref >90)
Potassium: 3.8 mEq/L (ref 3.5–5.1)
Total Protein: 7.1 g/dL (ref 6.0–8.3)

## 2011-07-20 LAB — DIFFERENTIAL
Basophils Relative: 0 % (ref 0–1)
Eosinophils Absolute: 0.4 10*3/uL (ref 0.0–0.7)
Eosinophils Relative: 4 % (ref 0–5)
Monocytes Relative: 6 % (ref 3–12)
Neutrophils Relative %: 56 % (ref 43–77)

## 2011-07-20 LAB — URINALYSIS, ROUTINE W REFLEX MICROSCOPIC
Leukocytes, UA: NEGATIVE
Nitrite: NEGATIVE
Specific Gravity, Urine: >1.03 — ABNORMAL HIGH (ref 1.005–1.030)
Urobilinogen, UA: 0.2 mg/dL (ref 0.0–1.0)
pH: 6 (ref 5.0–8.0)

## 2011-07-20 LAB — URINE MICROSCOPIC-ADD ON

## 2011-07-20 LAB — PREGNANCY, URINE: Preg Test, Ur: POSITIVE — AB

## 2011-07-20 LAB — CBC
Hemoglobin: 11.4 g/dL — ABNORMAL LOW (ref 12.0–15.0)
MCH: 26.9 pg (ref 26.0–34.0)
MCHC: 31.2 g/dL (ref 30.0–36.0)
MCV: 86.1 fL (ref 78.0–100.0)
Platelets: 336 10*3/uL (ref 150–400)

## 2011-07-20 MED ORDER — ONDANSETRON HCL 4 MG/2ML IJ SOLN
4.0000 mg | Freq: Once | INTRAMUSCULAR | Status: AC
  Administered 2011-07-20: 4 mg via INTRAVENOUS
  Filled 2011-07-20: qty 2

## 2011-07-20 MED ORDER — MORPHINE SULFATE 2 MG/ML IJ SOLN
2.0000 mg | Freq: Once | INTRAMUSCULAR | Status: AC
  Administered 2011-07-20: 2 mg via INTRAVENOUS
  Filled 2011-07-20: qty 1

## 2011-07-20 NOTE — Discharge Instructions (Signed)
Your blood work here was normal. Your urine was normal. Your pregnancy blood test is low, which would indicate that this pregnancy is not going to work. Keep your appointment with Dr. Emelda Fear on Wednesday. He will repeat the blood work at that time. If you have increased abdominal pain, developed fever and chills, or an increase in vaginal bleeding, return to the emergency room or contact Dr. Emelda Fear.

## 2011-07-20 NOTE — ED Provider Notes (Signed)
History     CSN: [id number]  Arrival date & time 07/19/11  2259   First MD Initiated Contact with Patient 07/20/11 [phone]      Chief Complaint  Patient presents with  . Abdominal Pain  . right side pain     (Consider location/radiation/quality/duration/timing/severity/associated sxs/prior treatment) HPI Heather [name] is a 36 y.o. female who presents to the Emergency Department complaining of right-sided abdominal pain that extended to her right flank earlier this evening. She has recently (last Friday) had a vaginal ultrasound done by her OB/GYN and was found to be having a miscarriage. She currently is spotting only. She denies any previous history of kidney stones. She has had some nausea, vomiting, and diarrhea. She denies dysuria. She has had some urinary frequency but not more than her usual.    Past Medical History  Diagnosis Date  . Amenorrhea   . Bipolar 1 disorder   . Dysmenorrhea   . COPD (chronic obstructive pulmonary disease)   . Dysrhythmia     DR Eden Emms    . Asthma   . Sleep apnea     CPAP  . Shortness of breath     WITH EXERTION   . GERD (gastroesophageal reflux disease)     HEARTBURN   TUMS  . Neuromuscular disorder     RESTLESS LEG   . Depression     Past Surgical History  Procedure Date  . Cholecystectomy   . Tonsillectomy     History reviewed. No pertinent family history.  History  Substance Use Topics  . Smoking status: Former Smoker -- 0.5 packs/day  . Smokeless tobacco: Not on file  . Alcohol Use: No    OB History    Grav Para Term Preterm Abortions TAB SAB Ect Mult Living   1               Review of Systems ROS: Statement: All systems negative except as marked or noted in the HPI; Constitutional: Negative for fever and chills. ; ; Eyes: Negative for eye pain, redness and discharge. ; ; ENMT: Negative for ear pain, hoarseness, nasal congestion, sinus pressure and sore throat. ; ; Cardiovascular: Negative for chest pain,  palpitations, diaphoresis, dyspnea and peripheral edema. ; ; Respiratory: Negative for cough, wheezing and stridor. ; ; Gastrointestinal: Negative for nausea, vomiting, diarrhea, , blood in stool, hematemesis, jaundice and rectal bleeding. . ; ; Genitourinary: Negative for dysuria, flank pain and hematuria. ; ; Musculoskeletal: Negative for back pain and neck pain. Negative for swelling and trauma.; ; Skin: Negative for pruritus, rash, abrasions, blisters, bruising and skin lesion.; ; Neuro: Negative for headache, lightheadedness and neck stiffness. Negative for weakness, altered level of consciousness , altered mental status, extremity weakness, paresthesias, involuntary movement, seizure and syncope.     Allergies  Bee and Penicillins  Home Medications   Current Outpatient Rx  Name Route Sig Dispense Refill  . ACETAMINOPHEN 500 MG PO TABS Oral Take 500 mg by mouth every 6 (six) hours as needed. Pain    . ALBUTEROL SULFATE HFA 108 (90 BASE) MCG/ACT IN AERS Inhalation Inhale 1-2 puffs into the lungs every 6 (six) hours as needed for wheezing. 1 Inhaler 0  . ARIPIPRAZOLE 5 MG PO TABS Oral Take 5 mg by mouth daily.     . BUSPIRONE HCL 10 MG PO TABS Oral Take 10 mg by mouth 2 (two) times daily. For anxiety    . ESCITALOPRAM OXALATE 20 MG PO TABS Oral Take 20  mg by mouth daily.     Marland Kitchen MELATONIN 5 MG PO TABS Oral Take 1 tablet by mouth at bedtime as needed. For sleep    . PRENATAL COMPLETE 14-0.4 MG PO TABS Oral Take 1 tablet by mouth daily. 60 each 0    BP 124/63  Pulse 77  Temp(Src) 97.9 F (36.6 C) (Oral)  Resp 20  SpO2 100%  LMP 06/29/2011  Physical Exam Physical examination:  Nursing notes reviewed; Vital signs and O2 SAT reviewed;  Constitutional:  Morbidly obese, Well developed, Well nourished, Well hydrated, In no acute distress; Head:  Normocephalic, atraumatic; Eyes: EOMI, PERRL, No scleral icterus; ENMT: Mouth and pharynx normal, Mucous membranes moist; Neck: Supple, Full range of  motion, No lymphadenopathy; Cardiovascular: Regular rate and rhythm, No murmur, rub, or gallop; Respiratory: Breath sounds clear & equal bilaterally, No rales, rhonchi, wheezes, or rub, Normal respiratory effort/excursion; Chest: Nontender, Movement normal; Abdomen: Obese,Soft, Nontender, mild right lower quadrant tenderness to palpation with no rebound or guarding , Normal bowel sounds; Genitourinary: No CVA tenderness; Extremities: Pulses normal, No tenderness, No edema, No calf edema or asymmetry.; Neuro: AA&Ox3, Major CN grossly intact.  No gross focal motor or sensory deficits in extremities.; Skin: Color normal, Warm, Dry  ED Course  Procedures (including critical care time)  Results for orders placed during the hospital encounter of 07/20/11  CBC      Component Value Range   WBC 9.5  4.0 - 10.5 (K/uL)   RBC 4.24  3.87 - 5.11 (MIL/uL)   Hemoglobin 11.4 (*) 12.0 - 15.0 (g/dL)   HCT 16.1  09.6 - 04.5 (%)   MCV 86.1  78.0 - 100.0 (fL)   MCH 26.9  26.0 - 34.0 (pg)   MCHC 31.2  30.0 - 36.0 (g/dL)   RDW 40.9  81.1 - 91.4 (%)   Platelets 336  150 - 400 (K/uL)  DIFFERENTIAL      Component Value Range   Neutrophils Relative 56  43 - 77 (%)   Neutro Abs 5.4  1.7 - 7.7 (K/uL)   Lymphocytes Relative 33  12 - 46 (%)   Lymphs Abs 3.2  0.7 - 4.0 (K/uL)   Monocytes Relative 6  3 - 12 (%)   Monocytes Absolute 0.6  0.1 - 1.0 (K/uL)   Eosinophils Relative 4  0 - 5 (%)   Eosinophils Absolute 0.4  0.0 - 0.7 (K/uL)   Basophils Relative 0  0 - 1 (%)   Basophils Absolute 0.0  0.0 - 0.1 (K/uL)  COMPREHENSIVE METABOLIC PANEL      Component Value Range   Sodium 139  135 - 145 (mEq/L)   Potassium 3.8  3.5 - 5.1 (mEq/L)   Chloride 105  96 - 112 (mEq/L)   CO2 26  19 - 32 (mEq/L)   Glucose, Bld 94  70 - 99 (mg/dL)   BUN 13  6 - 23 (mg/dL)   Creatinine, Ser 7.82  0.50 - 1.10 (mg/dL)   Calcium 9.3  8.4 - 95.6 (mg/dL)   Total Protein 7.1  6.0 - 8.3 (g/dL)   Albumin 3.3 (*) 3.5 - 5.2 (g/dL)   AST 25  0 -  37 (U/L)   ALT 33  0 - 35 (U/L)   Alkaline Phosphatase 66  39 - 117 (U/L)   Total Bilirubin 0.2 (*) 0.3 - 1.2 (mg/dL)   GFR calc non Af Amer >90  >90 (mL/min)   GFR calc Af Amer >90  >90 (mL/min)  PREGNANCY, URINE      Component Value Range   Preg Test, Ur POSITIVE (*) NEGATIVE   URINALYSIS, ROUTINE W REFLEX MICROSCOPIC      Component Value Range   Color, Urine YELLOW  YELLOW    APPearance HAZY (*) CLEAR    Specific Gravity, Urine >1.030 (*) 1.005 - 1.030    pH 6.0  5.0 - 8.0    Glucose, UA NEGATIVE  NEGATIVE (mg/dL)   Hgb urine dipstick SMALL (*) NEGATIVE    Bilirubin Urine NEGATIVE  NEGATIVE    Ketones, ur NEGATIVE  NEGATIVE (mg/dL)   Protein, ur 30 (*) NEGATIVE (mg/dL)   Urobilinogen, UA 0.2  0.0 - 1.0 (mg/dL)   Nitrite NEGATIVE  NEGATIVE    Leukocytes, UA NEGATIVE  NEGATIVE   URINE MICROSCOPIC-ADD ON      Component Value Range   Squamous Epithelial / LPF FEW (*) RARE    WBC, UA 0-2  <3 (WBC/hpf)   RBC / HPF 7-10  <3 (RBC/hpf)   Bacteria, UA FEW (*) RARE    Crystals CA OXALATE CRYSTALS (*) NEGATIVE    Urine-Other MUCOUS PRESENT    HCG, QUANTITATIVE, PREGNANCY      Component Value Range   hCG, Beta Chain, Quant, S 99 (*) <5 (mIU/mL)   No results found.  6:38 AM:  T/C to Dr. Sherlene Shams discussed, including:  HPI, pertinent PM/SHx, VS/PE, dx testing, ED course and treatment.  He advised that he had done an [place] yesterday in his office in the setting of moderately heavy vaginal bleeding. Also, advised felt the pregnancy was blighted due to dropping HCG quant. Last one done on 07/16/2011 was in the 60's, less than a previous one. He agreed with plan to obtain a repeat quant. Patient is to be seen in the office next week and already had an appointment on Wednesday.     MDM  Patient with recent ultrasound for vaginal bleeding and found to be in the process of having a miscarriage. Here today with a left lower quadrant discomfort. Labs are unremarkable. Spoke with Dr. Emelda Fear,  OB/GYN who had done the ultrasound yesterday and felt the patient was process of miscarrying. He agreed with repeat HCG quant. He will be seeing the patient in the office on Wednesday.Pt stable in ED with no significant deterioration in condition.The patient appears reasonably screened and/or stabilized for discharge and I doubt any other medical condition or other [facility] requiring further screening, evaluation, or treatment in the ED at this time prior to discharge.  MDM Reviewed: nursing note and vitals Interpretation: labs Consults: OB/GYN         Nicoletta Dress. Colon Branch, MD 07/20/11 [phone]

## 2011-07-28 ENCOUNTER — Emergency Department (HOSPITAL_COMMUNITY)
Admission: EM | Admit: 2011-07-28 | Discharge: 2011-07-29 | Disposition: A | Discharge status: Home / Self Care | Payer: Medicare Other | Attending: Emergency Medicine | Admitting: Emergency Medicine

## 2011-07-28 ENCOUNTER — Encounter (HOSPITAL_COMMUNITY): Payer: Self-pay

## 2011-07-28 DIAGNOSIS — O009 Unspecified ectopic pregnancy without intrauterine pregnancy: Secondary | ICD-10-CM | POA: Insufficient documentation

## 2011-07-28 DIAGNOSIS — R45851 Suicidal ideations: Secondary | ICD-10-CM

## 2011-07-28 LAB — DIFFERENTIAL
Basophils Relative: 0 % (ref 0–1)
Eosinophils Absolute: 0.4 10*3/uL (ref 0.0–0.7)
Lymphs Abs: 3.4 10*3/uL (ref 0.7–4.0)
Monocytes Absolute: 0.6 10*3/uL (ref 0.1–1.0)
Monocytes Relative: 5 % (ref 3–12)
Neutrophils Relative %: 61 % (ref 43–77)

## 2011-07-28 LAB — CBC
HCT: 39.4 % (ref 36.0–46.0)
Hemoglobin: 12.1 g/dL (ref 12.0–15.0)
MCH: 26.8 pg (ref 26.0–34.0)
MCHC: 30.7 g/dL (ref 30.0–36.0)
RBC: 4.52 MIL/uL (ref 3.87–5.11)

## 2011-07-28 LAB — ETHANOL: Alcohol, Ethyl (B): <11 mg/dL (ref 0–11)

## 2011-07-28 LAB — COMPREHENSIVE METABOLIC PANEL
Albumin: 3.6 g/dL (ref 3.5–5.2)
Alkaline Phosphatase: 63 U/L (ref 39–117)
BUN: 11 mg/dL (ref 6–23)
Chloride: 97 mEq/L (ref 96–112)
Creatinine, Ser: 0.61 mg/dL (ref 0.50–1.10)
GFR calc Af Amer: >90 mL/min (ref >90)
Glucose, Bld: 94 mg/dL (ref 70–99)
Potassium: 4.4 mEq/L (ref 3.5–5.1)
Total Bilirubin: 0.2 mg/dL — ABNORMAL LOW (ref 0.3–1.2)
Total Protein: 7.6 g/dL (ref 6.0–8.3)

## 2011-07-28 LAB — RAPID URINE DRUG SCREEN, HOSP PERFORMED: Opiates: NOT DETECTED

## 2011-07-28 MED ORDER — ESCITALOPRAM OXALATE 10 MG PO TABS
20.0000 mg | ORAL_TABLET | Freq: Every day | ORAL | Status: DC
  Administered 2011-07-28 – 2011-07-29 (×2): 20 mg via ORAL
  Filled 2011-07-28 (×3): qty 1

## 2011-07-28 MED ORDER — ALBUTEROL SULFATE HFA 108 (90 BASE) MCG/ACT IN AERS: 1.0000 | INHALATION_SPRAY | Freq: Four times a day (QID) | RESPIRATORY_TRACT | Status: DC | PRN

## 2011-07-28 MED ORDER — ESCITALOPRAM OXALATE 10 MG PO TABS
ORAL_TABLET | ORAL | Status: AC
  Filled 2011-07-28: qty 2

## 2011-07-28 MED ORDER — BUSPIRONE HCL 5 MG PO TABS
ORAL_TABLET | ORAL | Status: AC
  Filled 2011-07-28: qty 2

## 2011-07-28 MED ORDER — ACETAMINOPHEN 325 MG PO TABS
650.0000 mg | ORAL_TABLET | ORAL | Status: DC | PRN
  Administered 2011-07-29: 650 mg via ORAL
  Filled 2011-07-28: qty 2

## 2011-07-28 MED ORDER — ARIPIPRAZOLE 10 MG PO TABS
5.0000 mg | ORAL_TABLET | Freq: Every day | ORAL | Status: DC
  Administered 2011-07-28 – 2011-07-29 (×2): 5 mg via ORAL
  Filled 2011-07-28 (×3): qty 1

## 2011-07-28 MED ORDER — BUSPIRONE HCL 5 MG PO TABS
10.0000 mg | ORAL_TABLET | Freq: Two times a day (BID) | ORAL | Status: DC
  Administered 2011-07-28 – 2011-07-29 (×2): 10 mg via ORAL
  Filled 2011-07-28 (×4): qty 1

## 2011-07-28 MED ORDER — ARIPIPRAZOLE 10 MG PO TABS
ORAL_TABLET | ORAL | Status: AC
  Filled 2011-07-28: qty 1

## 2011-07-28 MED ORDER — LORAZEPAM 1 MG PO TABS
1.0000 mg | ORAL_TABLET | Freq: Three times a day (TID) | ORAL | Status: DC | PRN
  Administered 2011-07-29: 1 mg via ORAL
  Filled 2011-07-28: qty 1

## 2011-07-28 MED ORDER — ZOLPIDEM TARTRATE 5 MG PO TABS
5.0000 mg | ORAL_TABLET | Freq: Once | ORAL | Status: AC
  Administered 2011-07-28: 5 mg via ORAL

## 2011-07-28 MED ORDER — ZOLPIDEM TARTRATE 5 MG PO TABS
ORAL_TABLET | ORAL | Status: AC
  Filled 2011-07-28: qty 1

## 2011-07-28 NOTE — BH Assessment (Addendum)
Assessment Note   Heather [name] is an 36 y.o. female. She comes to the emergency room voluntarily to seek help with feeling suicidal. She reports feeling very depressed; reporting that she has been spiraling down for several months now. She doesn't specify the actual events which are causing this, however, she did reveal that she is pregnant, about 8  Weeks and that the pregnancy is tubal and not viable. She will be following up with her OB GYN to discuss this issues. She is very sadden by this, stating this is her first pregnancy. She states her partner is somewhat supportive. She if very flat. She does state she is suicidal and has had thoughts of harming herself. She attempted a very serious overdose several years ago where she took tylenol and ended up in Cardiac ICU, then once medically stabilized, she ended up at a psychiatric facility for about 7 or 8 days. She has also had one previous admission to [facility] in 2006. She has had not inpatient admissions in 7  Years. She sees [facility] ([facility]). She states she is being treated for Major Depression and Panic attacks. She reports an increase in her panic attacks, and states they have been about a 3 on a 5 point scale. She currently takes Abilify, Lexapro and Buspar. She is a pleasant woman, who is obese, but her affect is very flat and she is sad. She is voluntary.   Axis I: Major Depression, Recurrent severe; Panic Disorder without agoraphobia Axis II. Deferred Axis III Tubal Pregnancy, Obese, Asthma, COPD, GERD Axis IV: Severe: major life circumstance change, impacting her emotionally;  Axis V: GAF 24   Past Medical History:  Past Medical History  Diagnosis Date  . Amenorrhea   . Bipolar 1 disorder   . Dysmenorrhea   . COPD (chronic obstructive pulmonary disease)   . Dysrhythmia     DR Eden Emms    . Asthma   . Sleep apnea     CPAP  . Shortness of breath     WITH EXERTION   . GERD  (gastroesophageal reflux disease)     HEARTBURN   TUMS  . Neuromuscular disorder     RESTLESS LEG   . Depression     Past Surgical History  Procedure Date  . Cholecystectomy   . Tonsillectomy     Family History: No family history on file.  Social History:  reports that she has quit smoking. She does not have any smokeless tobacco history on file. She reports that she does not drink alcohol or use illicit drugs.  Additional Social History:    Allergies:  Allergies  Allergen Reactions  . Bee Anaphylaxis  . Penicillins Anaphylaxis    REACTION: Angioedema    Home Medications:  No current facility-administered medications on file as of 07/28/2011.   Medications Prior to Admission  Medication Sig Dispense Refill  . acetaminophen (TYLENOL) 500 MG tablet Take 500 mg by mouth every 6 (six) hours as needed. Pain      . albuterol (PROVENTIL HFA;VENTOLIN HFA) 108 (90 BASE) MCG/ACT inhaler Inhale 1-2 puffs into the lungs every 6 (six) hours as needed for wheezing.  1 Inhaler  0  . ARIPiprazole (ABILIFY) 5 MG tablet Take 5 mg by mouth daily.       . busPIRone (BUSPAR) 10 MG tablet Take 10 mg by mouth 2 (two) times daily. For anxiety      . escitalopram (LEXAPRO) 20 MG tablet Take 20  mg by mouth daily.       . Melatonin 5 MG TABS Take 1 tablet by mouth at bedtime as needed. For sleep      . Prenatal Vit-Fe Fumarate-FA (PRENATAL COMPLETE) 14-0.4 MG TABS Take 1 tablet by mouth daily.  60 each  0    OB/GYN Status:  Patient's last menstrual period was 06/29/2011.  General Assessment Data Location of Assessment: [facility] ACT Assessment: Yes Living Arrangements: Spouse/significant other Can pt return to current living arrangement?: Yes Admission Status: Voluntary Is patient capable of signing voluntary admission?: Yes Transfer from: Acute Hospital Referral Source: MD  Education Status Is patient currently in school?: No  Risk to self Suicidal Ideation: Yes-Currently Present Suicidal  Intent: Yes-Currently Present Is patient at risk for suicide?: Yes Suicidal Plan?: Yes-Currently Present Specify Current Suicidal Plan:  (-od) Access to Means: Yes Specify Access to Suicidal Means: pills What has been your use of drugs/alcohol within the last 12 months?:  (denies) Previous Attempts/Gestures: Yes How many times?:  (1) Triggers for Past Attempts: Unpredictable Intentional Self Injurious Behavior: None Family Suicide History: Unknown Recent stressful life event(s): Recent negative physical changes;Other (Comment) Persecutory voices/beliefs?: No Depression: Yes Depression Symptoms: Tearfulness;Fatigue;Loss of interest in usual pleasures;Feeling worthless/self pity Substance abuse history and/or treatment for substance abuse?: No Suicide prevention information given to non-admitted patients: Not applicable  Risk to Others Homicidal Ideation: No Thoughts of Harm to Others: No Current Homicidal Intent: No Current Homicidal Plan: No Access to Homicidal Means: No History of harm to others?: No Assessment of Violence: None Noted Does patient have access to weapons?: No Criminal Charges Pending?: No Does patient have a court date: No  Psychosis Hallucinations: None noted Delusions: None noted  Mental Status Report Appear/Hygiene: Disheveled Eye Contact: Fair Motor Activity: Agitation Speech: Logical/coherent Level of Consciousness: Alert Mood: Depressed;Anxious Affect: Blunted;Depressed;Sad Anxiety Level: Minimal Thought Processes: Circumstantial Judgement: Impaired Orientation: Person;Place;Time;Situation Obsessive Compulsive Thoughts/Behaviors: Minimal  Cognitive Functioning Concentration: Decreased Memory: Recent Intact IQ: Average Insight: Fair Impulse Control: Poor Appetite: Fair Sleep: Decreased Total Hours of Sleep:  (4) Vegetative Symptoms: None  Prior Inpatient Therapy Prior Inpatient Therapy: Yes Prior Therapy Dates:  (2006) Prior Therapy  Facilty/Provider(s):  ([facility] [place] [facility]) Reason for Treatment:  (OD, depression)  Prior Outpatient Therapy Prior Outpatient Therapy: Yes Prior Therapy Dates:  (current) Prior Therapy Facilty/Provider(s):  ([facility]) Reason for Treatment:  (tx for Depression)            Values / Beliefs Cultural Requests During Hospitalization: None Spiritual Requests During Hospitalization: None        Additional Information 1:1 In Past 12 Months?:  (none) CIRT Risk: No Elopement Risk: No Does patient have medical clearance?: Yes     Disposition:  Patient to leave the hospital to go to [facility]. She will return later today and will need psychiatric in-patient care at that time. Dr Colon Branch made aware of this and agrees with it.  Patient was able to be treated at [facility], without having to go to [facility]. Patient now denies any thoughts to harm her self. She is willing to contract for safety. She will be staying with her boyfriend. He will keep and dispense her medications. She is agreeable with this. She will continue follow up with [facility]. Plan discussed with both Dr Colon Branch and Dr Emelda Fear.  Disposition Disposition of Patient: Inpatient treatment program Type of inpatient treatment program: Adult  On Site Evaluation by:  Dr. Estell Harpin Reviewed with Physician:  Dr. Estell Harpin  Patient is being referred to [facility] and [facility] waiting lists. There are no beds at [facility], [facility], [facility], or [place].      Heather [name] 07/28/2011 5:37 PM  6:30 PM  Patient has been denied by Dr. Marylou Flesher at [facility] due to her medical issues.   Heather [name]

## 2011-07-28 NOTE — ED Notes (Signed)
Pt given water and crackers, pt knows that we need a urine.

## 2011-07-28 NOTE — ED Notes (Signed)
Stated she was depressed and wanted to kill self, plan is overdose on meds, recently found out she is pregnant--is in tubes and is getting a "shot " on Monday, being followed by [facility]

## 2011-07-29 LAB — CBC
HCT: 38.7 % (ref 36.0–46.0)
MCV: 86.4 fL (ref 78.0–100.0)
RBC: 4.48 MIL/uL (ref 3.87–5.11)
WBC: 10.3 10*3/uL (ref 4.0–10.5)

## 2011-07-29 LAB — DIFFERENTIAL
Eosinophils Relative: 3 % (ref 0–5)
Lymphocytes Relative: 32 % (ref 12–46)
Lymphs Abs: 3.3 10*3/uL (ref 0.7–4.0)
Monocytes Absolute: 0.5 10*3/uL (ref 0.1–1.0)

## 2011-07-29 LAB — TYPE AND SCREEN: Antibody Screen: NEGATIVE

## 2011-07-29 LAB — CREATININE, SERUM
GFR calc Af Amer: >90 mL/min (ref >90)
GFR calc non Af Amer: >90 mL/min (ref >90)

## 2011-07-29 MED ORDER — METHOTREXATE INJECTION FOR WOMEN'S HOSPITAL
50.0000 mg/m2 | Freq: Once | INTRAMUSCULAR | Status: AC
  Administered 2011-07-29: 155 mg via INTRAMUSCULAR
  Filled 2011-07-29: qty 3.1

## 2011-07-29 NOTE — ED Notes (Signed)
Spoke with Dr. Emelda Fear due to pt request of getting a shot due to tubal pregnancy. Dr Emelda Fear states she needs that shot today. States to call him back in 2 hrs, he will make some calls to get med. Pt in nad at this time.

## 2011-07-29 NOTE — ED Notes (Signed)
Pt just contracted for safety. Form signed. Pt denies si/hi and agrees to let boyfriend who is at bedside keep her meds until she feels better.

## 2011-07-29 NOTE — Progress Notes (Signed)
Spoke with Dr Emelda Fear in the ED. He is here to see the patient regarding the tubal pregnancy. His plan is for her to go to [facility] for a procedure and then return later today. He also asked if the patient could contract for safety. It was explained that the patient continues to express suicidal ideation, with a plan to overdose. She has been unwilling to contract for safety.  The plan remains for her to go to [facility].

## 2011-07-29 NOTE — ED Notes (Signed)
PT aware [facility] be [facility] the medicine her for pt instead of transferring pt. Per dr strand. Nad. No changes. Has been cooperative.

## 2011-07-29 NOTE — ED Provider Notes (Addendum)
Patient is well known to me. Patient has a suspected chronic ectopic, based on low level qHcg levels that are persistently in 93-170 range over the last 10 days.  PT refused methotrexate 10 days ago, refused endometrial biopsy 10 days ago, sought second opinion in [facility] last Friday, and now accepts that recommended Methotrexate therapy is appropriate and acceptable. She now desires to proceed with MTX therapy. She remains stable , no acute distress other than her emotional distress over pregnancy loss.  Ultrasound was done in my office early last week, and did NOT show any identifiable adnexal pathology.  Second opinion ultrasound Friday also showed no identifiable adnexal mass or bleeding, as per Phone report from Dr Malachi Carl this morning. Quant Hcg in her office Friday was 150's. Patient will now be transferred to [facility] to receive the Methotrexate as per protocol; I will discuss with Pharmacy to see if adjustments in protocol needed to reflect pt's BMI of over 60.   I will follow serial QHcg's thru my office once MTX administered.  [facility] to be [facility] completion of [facility] health disposition.  Case discussed with [facility], Nurse Practitioner at [facility], who will handle MTX protocol at [facility], then transfer back.

## 2011-07-29 NOTE — ED Provider Notes (Addendum)
Earlier in the evening while the emergency department was busy with acute patients at higher severity score then Ms. Darrah, the patient's nurse notified me that the patient was requesting to speak with me because she would like to be discharged home. Unfortunately, at that time I was occupied with the management of acute and some unstable patient's still coming in to the emergency department and had to convey the message through the patient's nurse that I would indeed come and speak with her to discuss her request to be discharged home and to arrange for callus psychiatric evaluation prior to doing so, but it would have to happen later once the emergency department slowed down. At this time, I have gone in to speak to the patient who is sleeping soundly, somnolent, and woke up enough with verbal and tactile stimulation to a nodular presents but would not/could not awaken enough to have a discussion and she indicated that she would like to speak about this later in the morning. I agreed and notified her nurse to let him know when she awoke selected, back and reevaluate her. At this time vital signs are stable and the patient is in no apparent distress.  Felisa Bonier, MD 07/29/11 0358  6:00 AM The patient's nurse let me know that at this time the patient was awake and ready to speak to me. I went to her room and found her sitting upright in a chair in no apparent distress, calm with a normal affect and demeanor, and I asked her "you're nurse tells me that you would like to go home, is that right?"  The patient gave me a puzzled look and replied, "no... no, I must of been 'off the wall 'or something, no, I would like to stay to get treatment." "Oh, okay, will do you need anything else right now?" I asked her. "No, like what?" she replied quizicaly. "Okay, so you are fine for right now?" I asked. "Yes." She said. Patient will continue the current treatment plan of observation pending inpatient  psychiatric placement for further treatment.  Felisa Bonier, MD 07/29/11 [phone]

## 2011-07-29 NOTE — ED Notes (Signed)
Pt taking shower.  

## 2011-07-29 NOTE — Discharge Instructions (Signed)
You have signed a contract for safety. If you feel unsafe, return to the ER. Follow up with Dr. Emelda Fear in four days for a blood test. You have been given a schedule of days you will need to have blood drawn.    Ectopic Pregnancy An ectopic pregnancy happens when a fertilized egg grows outside the uterus. A pregnancy cannot live outside of the uterus. This problem often happens in the fallopian tube. It is often caused by damage to the fallopian tube. If this problem is found early, you may be treated with medicine. If your tube tears or bursts open (ruptures), you will bleed inside. This is an emergency. You will need surgery. Get help right away.  SYMPTOMS You may have normal pregnancy symptoms at first. These include:  Missing your period.   Feeling sick to your stomach (nauseous).   Being tired.   Having tender breasts.  Then, you may start to have symptoms that are not normal. These include:  Pain with sex (intercourse).   Bleeding from the vagina. This includes light bleeding (spotting).   Belly (abdomen) or lower belly cramping or pain. This may be felt on one side.   A fast heartbeat (pulse).   Passing out (fainting) after going poop (bowel movement).  If your tube tears, you may have symptoms such as:  Really bad pain in the belly or lower belly. This happens suddenly.   Dizziness.   Passing out.   Shoulder pain.  GET HELP RIGHT AWAY IF:  You have any symptoms that are not normal. This is an emergency. Document Released: 06/21/2008 Document Revised: 03/14/2011 Document Reviewed: 11/29/2010 [facility] Patient Information 2012 [place], [place].

## 2011-07-29 NOTE — ED Notes (Signed)
Pt c/o not being able to sleep. Pt then stated "I just want to go home." Advised pt that I would have to speak with the doctor. Dr. Fredricka Bonine made aware of situation and will go talk to pt. Advised pt that if she tried to leave on her own, she would be made involuntary.

## 2011-07-29 NOTE — ED Notes (Addendum)
Pt states she wants to go home.denies si/hi at this time.  TOmmy from ACT in to talk with pt

## 2011-07-31 ENCOUNTER — Emergency Department (HOSPITAL_COMMUNITY)
Admission: EM | Admit: 2011-07-31 | Discharge: 2011-08-02 | Disposition: A | Discharge status: Nursing Home (Includes ICF) | Payer: Medicare Other | Source: Home / Self Care | Attending: Emergency Medicine | Admitting: Emergency Medicine

## 2011-07-31 ENCOUNTER — Encounter (HOSPITAL_COMMUNITY): Payer: Self-pay | Admitting: Emergency Medicine

## 2011-07-31 DIAGNOSIS — F17200 Nicotine dependence, unspecified, uncomplicated: Secondary | ICD-10-CM | POA: Insufficient documentation

## 2011-07-31 DIAGNOSIS — K219 Gastro-esophageal reflux disease without esophagitis: Secondary | ICD-10-CM | POA: Insufficient documentation

## 2011-07-31 DIAGNOSIS — R110 Nausea: Secondary | ICD-10-CM | POA: Insufficient documentation

## 2011-07-31 DIAGNOSIS — R45851 Suicidal ideations: Secondary | ICD-10-CM | POA: Insufficient documentation

## 2011-07-31 DIAGNOSIS — Z79899 Other long term (current) drug therapy: Secondary | ICD-10-CM | POA: Insufficient documentation

## 2011-07-31 DIAGNOSIS — J449 Chronic obstructive pulmonary disease, unspecified: Secondary | ICD-10-CM | POA: Insufficient documentation

## 2011-07-31 DIAGNOSIS — J4489 Other specified chronic obstructive pulmonary disease: Secondary | ICD-10-CM | POA: Insufficient documentation

## 2011-07-31 DIAGNOSIS — F3130 Bipolar disorder, current episode depressed, mild or moderate severity, unspecified: Secondary | ICD-10-CM | POA: Insufficient documentation

## 2011-07-31 LAB — URINALYSIS, ROUTINE W REFLEX MICROSCOPIC
Glucose, UA: NEGATIVE mg/dL
Protein, ur: NEGATIVE mg/dL
Specific Gravity, Urine: 1.025 (ref 1.005–1.030)
pH: 6 (ref 5.0–8.0)

## 2011-07-31 LAB — DIFFERENTIAL
Basophils Absolute: 0 10*3/uL (ref 0.0–0.1)
Basophils Relative: 0 % (ref 0–1)
Monocytes Absolute: 0.4 10*3/uL (ref 0.1–1.0)
Neutro Abs: 7.9 10*3/uL — ABNORMAL HIGH (ref 1.7–7.7)
Neutrophils Relative %: 64 % (ref 43–77)

## 2011-07-31 LAB — RAPID URINE DRUG SCREEN, HOSP PERFORMED
Amphetamines: NOT DETECTED
Benzodiazepines: NOT DETECTED
Cocaine: NOT DETECTED
Opiates: POSITIVE — AB
Tetrahydrocannabinol: NOT DETECTED

## 2011-07-31 LAB — COMPREHENSIVE METABOLIC PANEL
AST: 35 U/L (ref 0–37)
Albumin: 4 g/dL (ref 3.5–5.2)
Chloride: 99 mEq/L (ref 96–112)
Creatinine, Ser: 0.71 mg/dL (ref 0.50–1.10)
Total Bilirubin: 0.3 mg/dL (ref 0.3–1.2)

## 2011-07-31 LAB — URINE MICROSCOPIC-ADD ON

## 2011-07-31 LAB — CBC
MCHC: 31 g/dL (ref 30.0–36.0)
RDW: 15.3 % (ref 11.5–15.5)

## 2011-07-31 MED ORDER — ARIPIPRAZOLE 10 MG PO TABS
5.0000 mg | ORAL_TABLET | Freq: Every day | ORAL | Status: DC
  Administered 2011-08-01: 5 mg via ORAL
  Filled 2011-07-31 (×3): qty 1

## 2011-07-31 MED ORDER — BUPROPION HCL ER (XL) 150 MG PO TB24
150.0000 mg | ORAL_TABLET | Freq: Every day | ORAL | Status: DC
  Administered 2011-08-01: 150 mg via ORAL
  Filled 2011-07-31 (×3): qty 1

## 2011-07-31 MED ORDER — ONDANSETRON HCL 4 MG PO TABS: 4.0000 mg | ORAL_TABLET | Freq: Three times a day (TID) | ORAL | Status: DC | PRN

## 2011-07-31 MED ORDER — ESCITALOPRAM OXALATE 20 MG PO TABS
20.0000 mg | ORAL_TABLET | Freq: Every day | ORAL | Status: DC
  Filled 2011-07-31 (×2): qty 1

## 2011-07-31 MED ORDER — BUSPIRONE HCL 5 MG PO TABS
ORAL_TABLET | ORAL | Status: AC
  Filled 2011-07-31: qty 2

## 2011-07-31 MED ORDER — ACETAMINOPHEN 325 MG PO TABS
650.0000 mg | ORAL_TABLET | ORAL | Status: DC | PRN
  Administered 2011-07-31: 650 mg via ORAL
  Filled 2011-07-31: qty 2

## 2011-07-31 MED ORDER — BUSPIRONE HCL 5 MG PO TABS
10.0000 mg | ORAL_TABLET | Freq: Two times a day (BID) | ORAL | Status: DC
Start: 2011-07-31 — End: 2011-08-02
  Administered 2011-07-31 – 2011-08-01 (×3): 10 mg via ORAL
  Filled 2011-07-31: qty 1
  Filled 2011-07-31 (×2): qty 2
  Filled 2011-07-31 (×3): qty 1

## 2011-07-31 MED ORDER — LORAZEPAM 1 MG PO TABS
1.0000 mg | ORAL_TABLET | Freq: Three times a day (TID) | ORAL | Status: DC | PRN
  Administered 2011-07-31: 1 mg via ORAL
  Filled 2011-07-31: qty 1

## 2011-07-31 NOTE — ED Notes (Signed)
Pt states she went to [facility] and expressed thoughts of killing herself due to her ectopic pregnancy.

## 2011-07-31 NOTE — ED Notes (Signed)
Received report

## 2011-07-31 NOTE — ED Notes (Signed)
Pt moved to bariatric bed for comfort.  Meal tray given.  nad noted.  Sitter at bedside.

## 2011-07-31 NOTE — BH Assessment (Addendum)
Assessment Note   Heather [name] is an 36 y.o. female. PT HAS A HISTORY OF BIPOLAR AND CURRENTLY SEES CARLEY ENDERS AT [facility]. SHE HAS A PRIOR HX OF OVERDOSE AND HAS BEEN STABLE ITH TREATMENT AT THE LOCAL MENTAL HEALTH AND TAKING HER MEDICATIONS AS PRESCRIBED-ABILIFY 5MG QD: LEXAPRO 20MG  QD AND BUSPAR 10MG QD. HOWEVER 2 MONTHS AGO PT WAS DISCOVERED SHE WAS PREGNANT AND LATER DISCOVERED IT WAS A ECTOPIC PREGNANCY AND ON 07-29-11 PT WAS TREATED WITH METHOTREXATE TO DISSOLVE THE FETUS. PT REPORTS THIS WAS HER FIRST PREGNANCY AND SHE IS DEPRESSED BY THIS SITUATION. SHE REPORTS THIS AM SHE TOOK 2 HYDROCODONE AND 1 KLNOPIN PILL TO DULL THE MENTAL PAIN SHE EXPERENCED. SHE DENIES THIS WAS A SUICIDE ATTEMPT. SHE ADMITS TO FLEETING S/I BUT DENIES BEING ACTIVELY SUICIDAL AND REPORTS SHE CAN CONTRACT FOR SAFETY AND CAN FOLLOW UP WITH [facility]. PT DENIES H/I AND IS NOT PSYCHOTIC. PT'S FRIEND IS AT BEDSIDE AND IS VERY SUPPORTIVE. SINCE PT WAS PETITIONED BY [facility] IT IS RECOMMENDED A TELE-PSYCH BE COMPLETED TO DETERMINE DISPOSITION. DR Manus Gunning AGREES WITH RECOMMENDATION.           Axis I: Major Depression, Recurrent severe Axis II: Deferred Axis III: Tubal Pregnancy treated, Obese, Past Medical History  Diagnosis Date  . Amenorrhea   . Bipolar 1 disorder   . Dysmenorrhea   . COPD (chronic obstructive pulmonary disease)   . Dysrhythmia     DR Eden Emms    . Asthma   . Sleep apnea     CPAP  . Shortness of breath     WITH EXERTION   . GERD (gastroesophageal reflux disease)     HEARTBURN   TUMS  . Neuromuscular disorder     RESTLESS LEG   . Depression    Axis IV: other psychosocial or environmental problems Axis V: 41-50 serious symptoms         Past Medical History:  Past Medical History  Diagnosis Date  . Amenorrhea   . Bipolar 1 disorder   . Dysmenorrhea   . COPD (chronic obstructive pulmonary disease)   . Dysrhythmia     DR Eden Emms    . Asthma   . Sleep apnea     CPAP  .  Shortness of breath     WITH EXERTION   . GERD (gastroesophageal reflux disease)     HEARTBURN   TUMS  . Neuromuscular disorder     RESTLESS LEG   . Depression     Past Surgical History  Procedure Date  . Cholecystectomy   . Tonsillectomy     Family History: No family history on file.  Social History:  reports that she has been smoking Cigarettes.  She has been smoking about .5 packs per day. She does not have any smokeless tobacco history on file. She reports that she does not drink alcohol or use illicit drugs.  Additional Social History:    Allergies:  Allergies  Allergen Reactions  . Bee Anaphylaxis  . Penicillins Anaphylaxis    REACTION: Angioedema  . Adhesive (Tape) Rash    Home Medications:  (Not in a hospital admission)  OB/GYN Status:  Patient's last menstrual period was 06/29/2011.  General Assessment Data Location of Assessment: [facility] Assessment Services ACT Assessment: Yes Living Arrangements: Spouse/significant other Can pt return to current living arrangement?: Yes Admission Status: Involuntary Is patient capable of signing voluntary admission?: No Transfer from: Acute Hospital Referral Source: MD  Education Status Contact person: DAWN DARRAH-MOTHER-[phone]  Risk to self  Suicidal Ideation: No Suicidal Intent: No Is patient at risk for suicide?: No Suicidal Plan?: No Specify Current Suicidal Plan: NA Access to Means: No What has been your use of drugs/alcohol within the last 12 months?: UNK Previous Attempts/Gestures: Yes How many times?: 1  Other Self Harm Risks: NA Triggers for Past Attempts: Unpredictable Intentional Self Injurious Behavior: None Family Suicide History: Unknown Recent stressful life event(s): Recent negative physical changes (ETOPIC PREGANCY) Persecutory voices/beliefs?: No Depression: Yes Depression Symptoms: Despondent;Feeling worthless/self pity Substance abuse history and/or treatment for substance abuse?:  Yes Suicide prevention information given to non-admitted patients: Not applicable  Risk to Others Homicidal Ideation: No Thoughts of Harm to Others: No Current Homicidal Intent: No Current Homicidal Plan: No Access to Homicidal Means: No History of harm to others?: No Assessment of Violence: None Noted Does patient have access to weapons?: No Criminal Charges Pending?: No Does patient have a court date: No  Psychosis Hallucinations: None noted Delusions: None noted  Mental Status Report Appear/Hygiene: Improved Eye Contact: Good Motor Activity: Freedom of movement Speech: Logical/coherent Level of Consciousness: Alert Mood: Depressed;Despair;Sad;Worthless, low self-esteem Affect: Blunted;Depressed;Sad Anxiety Level: Minimal Thought Processes: Coherent;Relevant Judgement: Unimpaired Orientation: Person;Place;Time;Situation Obsessive Compulsive Thoughts/Behaviors: None  Cognitive Functioning Concentration: Normal Memory: Recent Intact;Remote Intact IQ: Average Insight: Good Impulse Control: Fair Appetite: Good Sleep: No Change Total Hours of Sleep: 8  Vegetative Symptoms: None  Prior Inpatient Therapy Prior Inpatient Therapy: Yes Prior Therapy Dates: 2004 AND 2006 Prior Therapy Facilty/Provider(s): [facility], [facility] Reason for Treatment: DETOX AND DEPRESSION WITH S/I-OVERDOSE  Prior Outpatient Therapy Prior Outpatient Therapy: Yes Prior Therapy Dates: CURRENTLY Prior Therapy Facilty/Provider(s): [facility] Reason for Treatment: DEPRESSION            Values / Beliefs Cultural Requests During Hospitalization: None Spiritual Requests During Hospitalization: None        Additional Information 1:1 In Past 12 Months?: No CIRT Risk: No Elopement Risk: No Does patient have medical clearance?: Yes     Disposition:PENDING TELE-PSYCH. Tele-psych verified that patient did need inpatient care. Patient referred to [facility] center and to [facility]  declined the patient due to medical needs. Patient was also referred to [facility].    Disposition Disposition of Patient: Other dispositions Other disposition(s): Other (Comment) (PENDING TELE-PSYCH)  On Site Evaluation by:   Reviewed with Physician:     Hattie Perch Winford 07/31/2011 10:19 PM

## 2011-07-31 NOTE — ED Provider Notes (Addendum)
History   This chart was scribed for Glynn Octave, MD by Melba Coon. The patient was seen in room APA15/APA15 and the patient's care was started at 4:43PM.    CSN: [id number]  Arrival date & time 07/31/11  1606   First MD Initiated Contact with Patient 07/31/11 1632      Chief Complaint  Patient presents with  . 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 Pt first found out she was pregnant 2 months ago. Recently, pt found out she has an ectopic pregnancy and tried to "numb the pain" by taking hydrocodone 2x today. Plan for SI present. Pt states that this experience has been "overwhelmng for her". Abd pain waxing and waning. Nausea present. No HA, fever, neck pain, sore throat, rash, back pain, CP, SOB, emesis, diarrhea, dysuria, or extremity pain, edema, weakness, numbness, or tingling. No known allergies. No other pertinent medical symptoms.  PCP: Dr Emelda Fear  Past Medical History  Diagnosis Date  . Amenorrhea   . Bipolar 1 disorder   . Dysmenorrhea   . COPD (chronic obstructive pulmonary disease)   . Dysrhythmia     DR Eden Emms    . Asthma   . Sleep apnea     CPAP  . Shortness of breath     WITH EXERTION   . GERD (gastroesophageal reflux disease)     HEARTBURN   TUMS  . Neuromuscular disorder     RESTLESS LEG   . Depression     Past Surgical History  Procedure Date  . Cholecystectomy   . Tonsillectomy     No family history on file.  History  Substance Use Topics  . Smoking status: Current Everyday Smoker -- 0.5 packs/day    Types: Cigarettes  . Smokeless tobacco: Not on file  . Alcohol Use: No    OB History    Grav Para Term Preterm Abortions TAB SAB Ect Mult Living   1               Review of Systems 10 Systems reviewed and all are  negative for acute change except as noted in the HPI.   Allergies  Bee and Penicillins  Home Medications   Current Outpatient Rx  Name Route Sig Dispense Refill  . ACETAMINOPHEN 500 MG PO TABS Oral Take 500 mg by mouth every 6 (six) hours as needed. Pain    . ALBUTEROL SULFATE HFA 108 (90 BASE) MCG/ACT IN AERS Inhalation Inhale 1-2 puffs into the lungs every 6 (six) hours as needed for wheezing. 1 Inhaler 0  . ARIPIPRAZOLE 5 MG PO TABS Oral Take 5 mg by mouth daily.     . BUSPIRONE HCL 10 MG PO TABS Oral Take 10 mg by mouth 2 (two) times daily. For anxiety    . ESCITALOPRAM OXALATE 20 MG PO TABS Oral Take 20 mg by mouth daily.     Marland Kitchen MELATONIN 5 MG PO TABS Oral Take 1 tablet by mouth at bedtime as needed. For sleep    . PRENATAL COMPLETE 14-0.4 MG PO TABS Oral Take 1 tablet by mouth daily. 60 each 0    BP 147/94  Pulse 106  Temp(Src) 98.4 F (36.9 C) (Oral)  Resp 20  Ht 5\' 3"  (1.6 m)  Wt 470 lb (213.191 kg)  BMI 83.26 kg/m2  SpO2 100%  LMP 06/29/2011  Physical Exam  Nursing note and vitals reviewed. Constitutional: She appears well-developed and well-nourished.       Awake, alert, nontoxic appearance with baseline speech for patient. Morbidly obese.  HENT:  Head: Normocephalic and atraumatic.  Mouth/Throat: No oropharyngeal exudate.  Eyes: EOM are normal. Pupils are equal, round, and reactive to light. Right eye exhibits no discharge. Left eye exhibits no discharge.  Neck: Normal range of motion. Neck supple.  Cardiovascular: Normal rate, regular rhythm and normal heart sounds.   No murmur heard. Pulmonary/Chest: Effort normal and breath sounds normal. No stridor. No respiratory distress. She has no wheezes. She has no rales. She exhibits no tenderness.  Abdominal: Soft. Bowel sounds are normal. She exhibits no mass. There is no tenderness. There is no rebound.  Musculoskeletal: She exhibits no tenderness.       Baseline ROM, moves extremities with no obvious new focal  weakness.  Lymphadenopathy:    She has no cervical adenopathy.  Neurological:       Awake, alert, cooperative and aware of situation; motor strength bilaterally; sensation normal to light touch bilaterally; peripheral visual fields full to confrontation; no facial asymmetry; tongue midline; major cranial nerves appear intact; normal finger to nose bilaterally, no clonus .  Skin: Skin is warm. No rash noted.  Psychiatric:       Flat affect.    ED Course  Procedures (including critical care time)  DIAGNOSTIC STUDIES: Oxygen Saturation is 100% on room ar, normal by my interpretation.    COORDINATION OF CARE:  4:48PM - EDMD will order blood w/u and UA for the pt.   Labs Reviewed  CBC - Abnormal; Notable for the following:    WBC 12.5 (*)    Hemoglobin 11.9 (*)    Platelets 401 (*)    All other components within normal limits  DIFFERENTIAL - Abnormal; Notable for the following:    Neutro Abs 7.9 (*)    All other components within normal limits  COMPREHENSIVE METABOLIC PANEL - Abnormal; Notable for the following:    ALT 40 (*)    All other components within normal limits  URINALYSIS, ROUTINE W REFLEX MICROSCOPIC - Abnormal; Notable for the following:    Color, Urine AMBER (*) BIOCHEMICALS MAY BE AFFECTED BY COLOR   Hgb urine dipstick LARGE (*)    All other components within normal limits  URINE RAPID DRUG SCREEN (HOSP PERFORMED) - Abnormal; Notable for the following:    Opiates POSITIVE (*)    All other components within normal limits  HCG, QUANTITATIVE, PREGNANCY - Abnormal; Notable for the following:    hCG, Beta Chain, Quant, S 109 (*)    All other components within normal limits  SALICYLATE LEVEL - Abnormal; Notable for the following:    Salicylate Lvl <2.0 (*)    All other components within normal limits  URINE MICROSCOPIC-ADD ON - Abnormal; Notable for the following:    Squamous Epithelial / LPF FEW (*)    Bacteria, UA FEW (*)    All other components within normal  limits  ETHANOL  ACETAMINOPHEN LEVEL   No results found.   No diagnosis found.    MDM  Suicidal ideation with thoughts of ingesting pills. Admits tointentional ingestion of 2 hydrocodone and 2 Klonopin earlier today. Denies any coingestants. Recent ectopic pregnancy status post methotrexate on April 22. Patient denies abdominal pain or vaginal bleeding  We'll check screening labs. Discussed with act team. I discussed patient's pregnancy issues with Dr. Emelda Fear. Her Quant is decreased  from 113 to 109.  He states her ectopic pregnancy is not an issue for psychiatric placement.  Hemoglobin stable. Acetaminophen level undetectable. Patient stable for psychiatric disposition.  Dr. Henderson Cloud recommends inpatient admission. Recommends discontinuing Lexapro and starting Wellbutrin.  I personally performed the services described in this documentation, which was scribed in my presence.  The recorded information has been reviewed and considered.       Glynn Octave, MD 07/31/11 [id number]  Glynn Octave, MD 07/31/11 2245

## 2011-08-01 LAB — HCG, QUANTITATIVE, PREGNANCY: hCG, Beta Chain, Quant, S: 95 m[IU]/mL — ABNORMAL HIGH (ref <5)

## 2011-08-01 NOTE — ED Notes (Addendum)
Pt eating breakfast tray, ate 100% of tray, denies any complaints at present,. Sitter at bedside,

## 2011-08-01 NOTE — ED Notes (Signed)
[facility] called at this time to tell act team that the pt has been declined at there facility for treatment. Melinda R.N. Aware at this time Tommy with act will also be advised. Aldrin Engelhard

## 2011-08-01 NOTE — ED Notes (Signed)
Attempting to call report. No one answers the phone

## 2011-08-01 NOTE — ED Notes (Signed)
Meal tray given to pt, family at bedside, sitter at bedside,

## 2011-08-01 NOTE — ED Notes (Signed)
Waiting for ac to call about patient bariatric bed availability to transfer to behavioral health

## 2011-08-01 NOTE — Progress Notes (Signed)
6:47 PM Pt has been accepted for transfer to [facility] health by Dr. Harvie Heck Readling to Dr. Peggye Form service.

## 2011-08-01 NOTE — Progress Notes (Signed)
PT ACCEPTED TO [facility] BY DR RANDY READLING TO DR Nickolas Madrid ROOM 503-2. SEE SUPPORT PAPERWORK.

## 2011-08-01 NOTE — ED Notes (Signed)
Attempted to call report

## 2011-08-01 NOTE — ED Notes (Signed)
Attempted to call report again and told to call the number back to give report

## 2011-08-02 ENCOUNTER — Encounter (HOSPITAL_COMMUNITY): Payer: Self-pay

## 2011-08-02 ENCOUNTER — Inpatient Hospital Stay (HOSPITAL_COMMUNITY)
Admission: AD | Admit: 2011-08-02 | Discharge: 2011-08-12 | DRG: 885 | Disposition: A | Discharge status: Home / Self Care | Payer: Medicare Other | Source: Ambulatory Visit | Attending: Psychiatry | Admitting: Psychiatry

## 2011-08-02 DIAGNOSIS — K219 Gastro-esophageal reflux disease without esophagitis: Secondary | ICD-10-CM | POA: Diagnosis present

## 2011-08-02 DIAGNOSIS — F17200 Nicotine dependence, unspecified, uncomplicated: Secondary | ICD-10-CM | POA: Diagnosis present

## 2011-08-02 DIAGNOSIS — G4700 Insomnia, unspecified: Secondary | ICD-10-CM | POA: Diagnosis not present

## 2011-08-02 DIAGNOSIS — R609 Edema, unspecified: Secondary | ICD-10-CM | POA: Diagnosis present

## 2011-08-02 DIAGNOSIS — F259 Schizoaffective disorder, unspecified: Principal | ICD-10-CM | POA: Diagnosis present

## 2011-08-02 DIAGNOSIS — G2581 Restless legs syndrome: Secondary | ICD-10-CM | POA: Diagnosis present

## 2011-08-02 DIAGNOSIS — F4321 Adjustment disorder with depressed mood: Secondary | ICD-10-CM | POA: Diagnosis present

## 2011-08-02 DIAGNOSIS — G4730 Sleep apnea, unspecified: Secondary | ICD-10-CM | POA: Diagnosis present

## 2011-08-02 DIAGNOSIS — F250 Schizoaffective disorder, bipolar type: Secondary | ICD-10-CM | POA: Diagnosis present

## 2011-08-02 DIAGNOSIS — J4489 Other specified chronic obstructive pulmonary disease: Secondary | ICD-10-CM | POA: Diagnosis present

## 2011-08-02 DIAGNOSIS — F489 Nonpsychotic mental disorder, unspecified: Secondary | ICD-10-CM | POA: Diagnosis present

## 2011-08-02 DIAGNOSIS — F410 Panic disorder [episodic paroxysmal anxiety] without agoraphobia: Secondary | ICD-10-CM | POA: Diagnosis present

## 2011-08-02 DIAGNOSIS — J449 Chronic obstructive pulmonary disease, unspecified: Secondary | ICD-10-CM | POA: Diagnosis present

## 2011-08-02 DIAGNOSIS — F5105 Insomnia due to other mental disorder: Secondary | ICD-10-CM | POA: Diagnosis present

## 2011-08-02 DIAGNOSIS — F4320 Adjustment disorder, unspecified: Secondary | ICD-10-CM | POA: Diagnosis present

## 2011-08-02 MED ORDER — LORAZEPAM 0.5 MG PO TABS
0.5000 mg | ORAL_TABLET | Freq: Three times a day (TID) | ORAL | Status: DC | PRN
  Administered 2011-08-04 – 2011-08-11 (×4): 0.5 mg via ORAL
  Filled 2011-08-02 (×5): qty 1

## 2011-08-02 MED ORDER — ARIPIPRAZOLE 10 MG PO TABS
10.0000 mg | ORAL_TABLET | Freq: Every day | ORAL | Status: DC
  Administered 2011-08-03 – 2011-08-05 (×3): 10 mg via ORAL
  Filled 2011-08-02 (×5): qty 1

## 2011-08-02 MED ORDER — ARIPIPRAZOLE 5 MG PO TABS
5.0000 mg | ORAL_TABLET | Freq: Every day | ORAL | Status: DC
  Administered 2011-08-02: 5 mg via ORAL
  Filled 2011-08-02 (×4): qty 1

## 2011-08-02 MED ORDER — ALUM & MAG HYDROXIDE-SIMETH 200-200-20 MG/5ML PO SUSP
30.0000 mL | ORAL | Status: DC | PRN
  Administered 2011-08-07: 30 mL via ORAL

## 2011-08-02 MED ORDER — MAGNESIUM HYDROXIDE 400 MG/5ML PO SUSP: 30.0000 mL | Freq: Every day | ORAL | Status: DC | PRN

## 2011-08-02 MED ORDER — LORAZEPAM 1 MG PO TABS: 1.0000 mg | ORAL_TABLET | Freq: Three times a day (TID) | ORAL | Status: DC | PRN

## 2011-08-02 MED ORDER — ONDANSETRON HCL 4 MG PO TABS: 4.0000 mg | ORAL_TABLET | Freq: Three times a day (TID) | ORAL | Status: DC | PRN

## 2011-08-02 MED ORDER — TRAZODONE HCL 150 MG PO TABS
150.0000 mg | ORAL_TABLET | Freq: Every day | ORAL | Status: DC
  Administered 2011-08-02: 150 mg via ORAL
  Filled 2011-08-02: qty 1
  Filled 2011-08-02: qty 3
  Filled 2011-08-02 (×2): qty 1

## 2011-08-02 MED ORDER — BUSPIRONE HCL 10 MG PO TABS
10.0000 mg | ORAL_TABLET | Freq: Two times a day (BID) | ORAL | Status: DC
  Administered 2011-08-02 – 2011-08-12 (×21): 10 mg via ORAL
  Filled 2011-08-02 (×26): qty 1

## 2011-08-02 MED ORDER — BUPROPION HCL ER (XL) 150 MG PO TB24
150.0000 mg | ORAL_TABLET | Freq: Every day | ORAL | Status: DC
  Administered 2011-08-02 – 2011-08-08 (×7): 150 mg via ORAL
  Filled 2011-08-02 (×11): qty 1

## 2011-08-02 MED ORDER — ACETAMINOPHEN 325 MG PO TABS
650.0000 mg | ORAL_TABLET | Freq: Four times a day (QID) | ORAL | Status: DC | PRN
Start: 2011-08-02 — End: 2011-08-12
  Administered 2011-08-03 – 2011-08-06 (×4): 650 mg via ORAL

## 2011-08-02 NOTE — Tx Team (Signed)
Initial Interdisciplinary Treatment Plan  PATIENT STRENGTHS: (choose at least two) Ability for insight Active sense of humor Average or above average intelligence General fund of knowledge Motivation for treatment/growth Religious Affiliation Supportive family/friends  PATIENT STRESSORS: Health problems Loss of child from ectopic pregnancy *   PROBLEM LIST: Problem List/Patient Goals Date to be addressed Date deferred Reason deferred Estimated date of resolution  Increased risk for SI 4/26     Depression 4/26     Anxiety 4/26     COPD, asthma, sleep apnea, GERD, Bipolar  4/26                                    DISCHARGE CRITERIA:  Ability to meet basic life and health needs Improved stabilization in mood, thinking, and/or behavior Medical problems require only outpatient monitoring Motivation to continue treatment in a less acute level of care Need for constant or Deller observation no longer present Reduction of life-threatening or endangering symptoms to within safe limits Verbal commitment to aftercare and medication compliance  PRELIMINARY DISCHARGE PLAN: Outpatient therapy Referrals indicated:    PATIENT/FAMIILY INVOLVEMENT: This treatment plan has been presented to and reviewed with the patient, CHARITI HAVEL.  The patient and family have been given the opportunity to ask questions and make suggestions.  Manfred Shirts, RN 08/02/2011, 4:51 AM

## 2011-08-02 NOTE — [facility] Counselor (Signed)
Adult Comprehensive Assessment  Patient ID: Heather [name], female   DOB: [birth year], 36 y.o.   MRN: [id number]  Information Source: Information source: Patient  Current Stressors:  Educational / Learning stressors: no stressors reported Employment / Job issues: disabled Family Relationships: no stressors reported Surveyor, quantity / Lack of resources (include bankruptcy): cannot marry bf or they will lose their checks Housing / Lack of housing: no stressors reported Physical health (include injuries & life threatening diseases): recent ectopic pregnancy, COPD Social relationships: no stressors reported Substance abuse: no stresssors reported - clean for 7 years Bereavement / Loss: loss of pregnancy and loss of surrogate grandmother  Living/Environment/Situation:  Living Arrangements: Spouse/significant other Living conditions (as described by patient or guardian): lives with boyfriend  How long has patient lived in current situation?: 7 years What is atmosphere in current home: Comfortable;Supportive;Loving  Family History:  Marital status: Long term relationship Long term relationship, how long?: 7 years What types of issues is patient dealing with in the relationship?: bf is supportive; they had planned to get married but due to both being on disability they would have to forfeit one check if they are married Does patient have children?: No (lost first pregnancy earlier this week due to being ectopic )  Childhood History:  By whom was/is the patient raised?: Mother;Other (Comment) Additional childhood history information: went to many group homes and foster homes as a child due to her "emotional and behavioral problems" for which she also received SSI as a child Description of patient's relationship with caregiver when they were a child: not Edgar Patient's description of current relationship with people who raised him/her: okay Does patient have siblings?: Yes Number of Siblings: 1    Description of patient's current relationship with siblings: sister - kind of [place] but she lives out of state Did patient suffer any verbal/emotional/physical/sexual abuse as a child?: No (denies any trauma and is unsure why she was in foster care ) Did patient suffer from severe childhood neglect?: No Has patient ever been sexually abused/assaulted/raped as an adolescent or adult?: No Was the patient ever a victim of a crime or a disaster?: No Witnessed domestic violence?: No Has patient been effected by domestic violence as an adult?: No  Education:  Highest grade of school patient has completed: high school graduate Currently a Consulting civil engineer?: No Learning disability?: Yes What learning problems does patient have?: unsure - some limted functioning and "special classes" in school  Employment/Work Situation:   Employment situation: On disability Why is patient on disability: 'behavior and and emotional problems" as a child, then for depression and COPD as an adult How long has patient been on disability: 13 years Patient's job has been impacted by current illness: No What is the longest time patient has a held a job?: no work history Where was the patient employed at that time?:  no work history Has patient ever been in the [employer]?: No Has patient ever served in Buyer, retail?: No  Financial Resources:   Surveyor, quantity resources: Field seismologist SSI;Medicare;Medicaid Does patient have a representative payee or guardian?: No  Alcohol/Substance Abuse:   What has been your use of drugs/alcohol within the last 12 months?: clean for 7 years from drugs and alcohol Alcohol/Substance Abuse Treatment Hx: Past Tx, Inpatient If yes, describe treatment: [facility] dual diagnosis treatment 7 years ago Has alcohol/substance abuse ever caused legal problems?: No  Social Support System:   Patient's Community Support System: Good Describe Community Support System: boyfriend, friends at [employer], mother  Type  of faith/religion: Ephriam Knuckles How does patient's faith help to cope with current illness?: prayer "I'm not a very strict christian"  Leisure/Recreation:   Leisure and Hobbies: spending time at the clubhouse, hanging out with bf, going shopping, animals   Strengths/Needs:   What things does the patient do well?: helps others, kind and loving In what areas does patient struggle / problems for patient: depression and suicidal thoughts, took some pills to numb pain after terminating first pregnancy which was ectopic, wanted to die along with the baby, lost surrogate grandmother 2 weeks ago  Discharge Plan:   Does patient have access to transportation?: No Plan for no access to transportation at discharge: Medicaid transportation Will patient be returning to same living situation after discharge?: Yes Currently receiving community mental health services: Yes (From Whom) ([facility] in [place]) Does patient have financial barriers related to discharge medications?: No  Summary/Recommendations:   Summary and Recommendations (to be completed by the evaluator): Heather [name] is a 36 year old single female diagnosed with Major Depressive Disorder. She reports main stressor for her depression at this time is the loss of her first child due to ectopic pregnancy, coupled with the death of a woman she considered to be like her grandmother. Reports she had stopped her medications due to the pregnancy. Heather [name] would benefit from crisis stabilization, therapy groups for processing thoughts,/feeling/experiences, medication evaluation, psychoed group sfor coping skills and case management for discharge planning.   [name], [name]. 08/02/2011

## 2011-08-02 NOTE — Discharge Planning (Signed)
Pt is a pleasant 36 yr old female that is bright in affect and appropriate in mood. Pt does admit to some depression but defiantly an improvement from pre-admission. Pt attends groups and interacts with others appropriately. A note about a needed hcg level for this pt has been placed as a physician sticky note. Pt may refuse Abilify in the am due to weight gain she reports experiencing. Pt encouraged to discuss this with the physician. Pt receptive to the information. Continued support and availability as needed has been extended to this pt. Pt safety remains with q30min checks.

## 2011-08-02 NOTE — Progress Notes (Signed)
Patient seen during d/c planning group.  She advised of admitting to hospital with off/on SI.  She informed that she had recently found out she was pregnant but had to terminate pregnancy earlier this week due to it being tubal.  She also advised of someone was like a "grandmother" dying two weeks ago.  She reports depression has increased.  She currently denies SI/HI.  She rates depression and anxiety at eight, hopelessness at five and helplessness at six.  Patient is seen outpatient at [facility].  She has home, transportation but will need assistance with meds.

## 2011-08-02 NOTE — BH Assessment (Signed)
Pt admitted due to [facility] after recently finding out that she has been pregnant for 2 months with an ectopic pregnancy. Pt reported that she wanted to leave with the baby. Pt reported these thoughts to [place] at [facility]. Who in return refer her to the ED for acute care. Pt is passive SI and contracts for safety at this time. Pt has a past med hx of COPD, Bipolar 1, SOB, asthma, GERD, and sleep apnea. Pt reports she has a cpap at home but hasn't use it in a long time and doesn't like to use. Pt tells writer that she doesn't want a Cpap at this time. Pt placed in bariatric bed with HOB raised.
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
## 2011-08-02 NOTE — Tx Team (Addendum)
Interdisciplinary Treatment Plan Update (Adult)  Date:  08/02/2011  Time Reviewed:  10:25 AM   Progress in Treatment: Attending groups: Yes Participating in groups:  Yes Taking medication as prescribed:  Yes Tolerating medication: Yes Family/Significant othe contact made:  No, requesting consent to contact support system Patient understands diagnosis: Yes Discussing patient identified problems/goals with staff:  Yes Medical problems stabilized or resolved: Yes Denies suicidal/homicidal ideation: Yes Issues/concerns per patient self-inventory:  No  Other:  New problem(s) identified: None  Reason for Continuation of Hospitalization: Depression, Anxiety, Medication Stabilization  Interventions implemented related to continuation of hospitalization:  Medication stabilization, safety checks q 15 mins, group attendance  Additional comments:  Estimated length of stay: 3-5 days  Discharge Plan: Discharge home and follow up with [facility] in [place], [place]  New goal(s):  Review of initial/current patient goals per problem list:   1.  Goal(s):  Decrease depressive symptoms to rating of 4 or less  Met:  No  Target date: by discharge  As evidenced by: [name] rates depression at      today  2.  Goal (s): Decrease anxiety symptoms to a rating to 4 or less  Met:  No  Target date: by discharge  As evidenced by: [name] rates anxiety at 7  today  3.  Goal(s): Reduce potential for self-harm/suicide  Met:  Yes  Target date: by discharge  As evidenced by:  [name] denies any thoughts of suicide   4.  Goal(s): Address medical condidtions  Met:  No  Target date: DEFERRED  As evidenced by: [name] does not address medical concerns, will refer back to medical   Attendees: Patient:     Family:     Physician:     Nursing:   [facility] McNichol 08/02/2011 10:25 AM  Case Manager:  Juline Patch, LCSW 08/02/2011 10:25 AM  Counselor:  Angus Palms, LCSW 08/02/2011 10:25 AM  Other:   Serena Colonel, NP 08/02/2011 10:25 AM  Other:     Other:     Other:      Scribe for Treatment Team:   [name], 08/02/2011 10:25 AM

## 2011-08-02 NOTE — Progress Notes (Signed)
[facility] Group Notes: (Counselor/Nursing/MHT/Case Management/Adjunct) 08/02/2011   @11 :00am Preventing Relapse  Type of Therapy:  Group Therapy  Participation Level:  Active  Participation Quality: Attentive Sharing Appropriate    Affect:  Blunted  Cognitive:  Appropriate  Insight:  Good  Engagement in Group: Good  Engagement in Therapy:  Good   Modes of Intervention:  Support and Exploration  Summary of Progress/Problems: Heather [name] processed her tendency to give and give to others but never take care of herself as a relapse trigger. She explored some of the responsibilities she has taken on as well as the fact that she feels bad or guilty saying "no" when she has too much on her plate. Heather [name] emphasized that it is not that helping others is dangerous for her, but the fact that she forgets about herself and doesn't take care of her own needs because that makes them easy to avoid. She related well to the concept of others not being as supportive to her as she is to them because she wears a mask and does not let them see her pain.  [name] 07/12/2011 12:23 PM

## 2011-08-02 NOTE — Progress Notes (Signed)
08/02/2011  Nursing  [address] has done a very good job today...she is seen interacting with the other pts. She makes eye contact. She has showered..completed her slef inventory and on it she wrote she denied SI, she rated her depression and hopelessness " 8 / 8" and she has not requested any prns. A She is compliant with her POC. R Safety is maintaiend and POC includes foastering therapeutice relationship already established PD RN [facility]
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
## 2011-08-03 LAB — HCG, SERUM, QUALITATIVE: Preg, Serum: POSITIVE — AB

## 2011-08-03 MED ORDER — TRAZODONE HCL 150 MG PO TABS
150.0000 mg | ORAL_TABLET | Freq: Every day | ORAL | Status: DC
  Administered 2011-08-03 – 2011-08-05 (×2): 150 mg via ORAL
  Filled 2011-08-03 (×5): qty 1

## 2011-08-03 NOTE — Progress Notes (Signed)
Patient reports having had a good day, her mom and her boyfriend visited her today. Patient reported that when the Dr. she saw today referred to her baby as a fetus it made her sad because she says " that was my baby". Pateitn was supported and encouraged, currently denies having pain, -si/hi/a/v hall. Safety maintained on unit, will continue to monitor.

## 2011-08-03 NOTE — Progress Notes (Signed)
[facility] Group Notes:  (Counselor/Nursing/MHT/Case Management/Adjunct)  08/03/2011 1315  Type of Therapy:  Group Therapy  Participation Level:  Active  Participation Quality:  Appropriate  Affect:  Appropriate  Cognitive:  Appropriate  Insight:  Good  Engagement in Group:  Good  Engagement in Therapy:  Good  Modes of Intervention:  Clarification, Problem-solving, Socialization and Support  Summary of Progress/Problems: Pt. attended and participated in group session on self-sabotage. Pt was asked to identify how their sabotaging thoughts and behaviors influence their goals. Pt sated that she sabotages by having negative thoughts in her head and overeating. Pt shared that she has been denied for gastric bypass surgery 3 times and is discouraged about losing weight. Pt also stated that she sabotages by comparing herself to to others. Pt stated that sh compares herself to other women who have children because she has a desire to have children and a family. Pt became tearful in group as she discussed the loss of her unborn baby.   [name], Desiree 08/03/2011, 4:49 PM

## 2011-08-03 NOTE — Progress Notes (Signed)
[facility] MD Progress Note  08/03/2011 3:44 PM  S: "I slept pretty good last night. But I woke up a little drowsy. The medicine for sleep was given to me at 10:00 pm. May that is why I feel drowsy in the morning. I took it late at night"  Diagnosis:   Axis I: Schizoaffective Disorder, bipolar type. Axis II: Deferred Axis III:  Past Medical History  Diagnosis Date  . Amenorrhea   . Bipolar 1 disorder   . Dysmenorrhea   . COPD (chronic obstructive pulmonary disease)   . Dysrhythmia     DR Eden Emms    . Asthma   . Sleep apnea     CPAP  . Shortness of breath     WITH EXERTION   . GERD (gastroesophageal reflux disease)     HEARTBURN   TUMS  . Neuromuscular disorder     RESTLESS LEG   . Depression    Axis IV: No changes Axis V: 51-60 moderate symptoms  ADL's:  Intact  Sleep: Good  Appetite:  Good  Suicidal Ideation:  Plan:  No Intent:  No Means:  No Homicidal Ideation:  Plan:  No Intent:  no Means:  no  AEB (as evidenced by): Per patient's reports.  Mental Status Examination/Evaluation: Objective:  Appearance: Casual, Obese  Eye Contact::  Good  Speech:  Clear and Coherent  Volume:  Normal  Mood:  "I feel better"  Affect:  Appropriate  Thought Process:  Coherent  Orientation:  Full  Thought Content:  Rumination  Suicidal Thoughts:  No  Homicidal Thoughts:  No  Memory:  Immediate;   Good Recent;   Good Remote;   Good  Judgement:  Fair  Insight:  Fair  Psychomotor Activity:  Normal  Concentration:  Good  Recall:  Good  Akathisia:  No  Handed:  Right  AIMS (if indicated):     Assets:  Desire for Improvement  Sleep:  Number of Hours: 6    Vital Signs:Blood pressure 155/93, pulse 98, temperature 97.4 F (36.3 C), temperature source Oral, resp. rate 18, last menstrual period 06/29/2011, unknown if currently breastfeeding. Current Medications: Current Facility-Administered Medications  Medication Dose Route Frequency Provider Last Rate Last Dose  .  acetaminophen (TYLENOL) tablet 650 mg  650 mg Oral Q6H PRN Verne Spurr, PA-C   650 mg at 08/03/11 0941  . alum & mag hydroxide-simeth (MAALOX/MYLANTA) 200-200-20 MG/5ML suspension 30 mL  30 mL Oral Q4H PRN Verne Spurr, PA-C      . ARIPiprazole (ABILIFY) tablet 10 mg  10 mg Oral Daily Sanjuana Kava, NP   10 mg at 08/03/11 0814  . buPROPion (WELLBUTRIN XL) 24 hr tablet 150 mg  150 mg Oral Daily Sanjuana Kava, NP   150 mg at 08/03/11 0814  . busPIRone (BUSPAR) tablet 10 mg  10 mg Oral BID Verne Spurr, PA-C   10 mg at 08/03/11 0815  . LORazepam (ATIVAN) tablet 0.5 mg  0.5 mg Oral Q8H PRN Sanjuana Kava, NP      . magnesium hydroxide (MILK OF MAGNESIA) suspension 30 mL  30 mL Oral Daily PRN Verne Spurr, PA-C      . ondansetron [facility]) tablet 4 mg  4 mg Oral Q8H PRN Verne Spurr, PA-C      . traZODone (DESYREL) tablet 150 mg  150 mg Oral QHS Sanjuana Kava, NP   150 mg at 08/02/11 2203  . DISCONTD: ARIPiprazole (ABILIFY) tablet 5 mg  5 mg Oral Daily Lloyd Huger  [place], PA-C   5 mg at 08/02/11 0829  . DISCONTD: LORazepam (ATIVAN) tablet 1 mg  1 mg Oral Q8H PRN Verne Spurr, PA-C        Lab Results:  Results for orders placed during the hospital encounter of 07/31/11 (from the past 48 hour(s))  HCG, QUANTITATIVE, PREGNANCY     Status: Abnormal   Collection Time   08/01/11  4:30 PM      Component Value Range Comment   hCG, Beta Chain, Quant, S 95 (*) <5 (mIU/mL)     Physical Findings: AIMS:  , ,  ,  ,    CIWA:    COWS:     Treatment Plan Summary: Daily contact with patient to assess and evaluate symptoms and progress in treatment Medication management  Plan: Obtain HCG, serum quantitative. Change Trazodone time to 2000. Continue current treatment plan.  Armandina Stammer I 08/03/2011, 3:44 PM

## 2011-08-03 NOTE — Progress Notes (Signed)
Pt. Has attended the groups and interacts with her peers appropriately. Likes to have special attention paid to her and is childlike in many ways. Needing reassurance and praise frequently. Denies SI and HI. Rates her depression at a 7 and her hopelessness at a 6. Talked about the loss of her baby and how that makes her feel very sad. Given reassurance and praise.

## 2011-08-03 NOTE — Progress Notes (Signed)
[facility] Adult Inpatient Family/Significant Other Suicide Prevention Education  Suicide Prevention Education:  Education Completed;  Brayton Caves Earles-[phone]-boyfriend- has been identified by the patient as the family member/significant other with whom the patient will be residing, and identified as the person(s) who will aid the patient in the event of a mental health crisis (suicidal ideations/suicide attempt).  With written consent from the patient, the family member/significant other has been provided the following suicide prevention education, prior to the and/or following the discharge of the patient.  The suicide prevention education provided includes the following:  Suicide risk factors  Suicide prevention and interventions  National Suicide Hotline telephone number  [facility] assessment telephone number  [facility] Emergency Assistance 911  [facility] and/or Residential Mobile Crisis Unit telephone number  Request made of family/significant other to:  Remove weapons (e.g., guns, rifles, knives), all items previously/currently identified as safety concern.  Pt.'s boyfriend states there are no weapons in the home.   Remove drugs/medications (over-the-counter, prescriptions, illicit drugs), all items previously/currently identified as a safety concern. Pt.'s boyfriend will help the pt. monitor her medications. Pt.'s boyfriend states that the pt. Sees Dr. Adora Fridge at [facility]. The family member/significant other verbalizes understanding of the suicide prevention education information provided.  The family member/significant other agrees to remove the items of safety concern listed above.  Lamar Blinks [place] 08/03/2011, 4:00 PM

## 2011-08-03 NOTE — Progress Notes (Signed)
[facility] Adult Inpatient Family/Significant Other Suicide Prevention Education  Suicide Prevention Education:  Contact Attempts:Dawn [phone] or [phone](cell)-Pt.'s mother has been identified by the patient as the family member/significant other with whom the patient will be residing, and identified as the person(s) who will aid the patient in the event of a mental health crisis.  With written consent from the patient, two attempts were made to provide suicide prevention education, prior to and/or following the patient's discharge.  We were unsuccessful in providing suicide prevention education.  A suicide education pamphlet was given to the patient to share with family/significant other.  Date and time of first attempt: on 08/03/11 at 3:39 p.m. By Lamar Blinks. Message was left at both numbers. Date and time of second attempt:  Neila Gear 08/03/2011, 3:28 PM

## 2011-08-03 NOTE — Progress Notes (Signed)
Patient ID: Heather [name], female   DOB: [birth year], 36 y.o.   MRN: [id number]   Patient lying in bed with eyes closed. Respirations even and non-labored. Staff will monitor on q 15 minute checks.

## 2011-08-03 NOTE — Progress Notes (Signed)
[facility] Adult Inpatient Family/Significant Other Suicide Prevention Education  Suicide Prevention Education:  Education Completed; Dawn [phone] or [phone]- has been identified by the patient as the family member/significant other with whom the patient will be residing, and identified as the person(s) who will aid the patient in the event of a mental health crisis (suicidal ideations/suicide attempt).  With written consent from the patient, the family member/significant other has been provided the following suicide prevention education, prior to the and/or following the discharge of the patient.  The suicide prevention education provided includes the following:  Suicide risk factors  Suicide prevention and interventions  National Suicide Hotline telephone number  [facility] assessment telephone number  [facility] Emergency Assistance 911  [facility] and/or Residential Mobile Crisis Unit telephone number  Request made of family/significant other to:  Remove weapons (e.g., guns, rifles, knives), all items previously/currently identified as safety concern.  Pt.'s mother states there are no guns in the home.  Remove drugs/medications (over-the-counter, prescriptions, illicit drugs), all items previously/currently identified as a safety concern. Pt.'s mother has no concerns. Does not  Live with the pt.   Pt.'s mother states that pt. Has not had any problems since about 7 years ago and that when the pt. Was on Geodone, that the pt. Responded well. Pt.'s mother stated that the pt.'s insurance changed and medication became too expensive. Pt.'s mother states the pt. Took her pregnancy loss very hard and wants the pt. to get the help she needs.  Pt.'s other states she is seen by [facility] and [facility] in [place].  The family member/significant other verbalizes understanding of the suicide prevention education information  provided.  The family member/significant other agrees to remove the items of safety concern listed above.  Lamar Blinks Ethel 08/03/2011, 3:32 PM

## 2011-08-04 NOTE — Progress Notes (Signed)
Pt rates her depression a 6 and her hopelessness a 5. Sates that she is having thoughts of SI on and off today. Feels that she is not being validated about the loss of her baby. Feels alone and hopeless. States that she did not plan for a pregnancy, but when she found out that she was pregnant it made her and her boyfriend feel happy. Believes that she is getting the message that the loss is not important and that it wasn't even a baby. Given support and active listening.

## 2011-08-04 NOTE — Progress Notes (Signed)
[facility] MD Progress Note  08/04/2011 3:15 PM  S: "Group makes you think. I am just trying to grasp everything, then put it all together. My mood is getting to feel better"  Diagnosis:   Axis I: Schizoaffective Disorder, bipolar type. Axis II: Deferred Axis III:  Past Medical History  Diagnosis Date  . Amenorrhea   . Bipolar 1 disorder   . Dysmenorrhea   . COPD (chronic obstructive pulmonary disease)   . Dysrhythmia     DR Eden Emms    . Asthma   . Sleep apnea     CPAP  . Shortness of breath     WITH EXERTION   . GERD (gastroesophageal reflux disease)     HEARTBURN   TUMS  . Neuromuscular disorder     RESTLESS LEG   . Depression    Axis IV: No changes Axis V: 51-60 moderate symptoms  ADL's:  Intact  Sleep: Good  Appetite:  Good  Suicidal Ideation:  Plan:  No Intent:  No Means:  No Homicidal Ideation:  Plan:  No Intent:  No Means:  No  AEB (as evidenced by): Per patient's report  Mental Status Examination/Evaluation: Objective:  Appearance: Casual, Obese  Eye Contact::  Good  Speech:  Clear and Coherent  Volume:  Normal  Mood:  Euthymic  Affect:  Appropriate  Thought Process:  Coherent  Orientation:  Full  Thought Content:  Rumination  Suicidal Thoughts:  No  Homicidal Thoughts:  No  Memory:  Immediate;   Good Recent;   Good Remote;   Good  Judgement:  Fair  Insight:  Fair  Psychomotor Activity:  Normal  Concentration:  Good  Recall:  Good  Akathisia:  No  Handed:  Right  AIMS (if indicated):     Assets:  Desire for Improvement  Sleep:  Number of Hours: 6.75    Vital Signs:Blood pressure 152/91, pulse 112, temperature 96.6 F (35.9 C), temperature source Oral, resp. rate 16, last menstrual period 06/29/2011, unknown if currently breastfeeding. Current Medications: Current Facility-Administered Medications  Medication Dose Route Frequency Provider Last Rate Last Dose  . acetaminophen (TYLENOL) tablet 650 mg  650 mg Oral Q6H PRN Verne Spurr, PA-C    650 mg at 08/04/11 1610  . alum & mag hydroxide-simeth (MAALOX/MYLANTA) 200-200-20 MG/5ML suspension 30 mL  30 mL Oral Q4H PRN Verne Spurr, PA-C      . ARIPiprazole (ABILIFY) tablet 10 mg  10 mg Oral Daily Sanjuana Kava, NP   10 mg at 08/04/11 0826  . buPROPion (WELLBUTRIN XL) 24 hr tablet 150 mg  150 mg Oral Daily Sanjuana Kava, NP   150 mg at 08/04/11 0827  . busPIRone (BUSPAR) tablet 10 mg  10 mg Oral BID Verne Spurr, PA-C   10 mg at 08/04/11 0827  . LORazepam (ATIVAN) tablet 0.5 mg  0.5 mg Oral Q8H PRN Sanjuana Kava, NP      . magnesium hydroxide (MILK OF MAGNESIA) suspension 30 mL  30 mL Oral Daily PRN Verne Spurr, PA-C      . ondansetron [facility]) tablet 4 mg  4 mg Oral Q8H PRN Verne Spurr, PA-C      . traZODone (DESYREL) tablet 150 mg  150 mg Oral Q2000 Sanjuana Kava, NP   150 mg at 08/03/11 2110  . DISCONTD: traZODone (DESYREL) tablet 150 mg  150 mg Oral QHS Sanjuana Kava, NP   150 mg at 08/02/11 2203    Lab Results:  Results for orders  placed during the hospital encounter of 08/02/11 (from the past 48 hour(s))  HCG, SERUM, QUALITATIVE     Status: Abnormal   Collection Time   08/03/11  7:27 PM      Component Value Range Comment   Preg, Serum POSITIVE (*) NEGATIVE      Physical Findings: AIMS:  , ,  ,  ,    CIWA:    COWS:     Treatment Plan Summary: Daily contact with patient to assess and evaluate symptoms and progress in treatment Medication management  Plan: Continue current treatment plan  Armandina Stammer I 08/04/2011, 3:15 PM

## 2011-08-04 NOTE — Progress Notes (Signed)
Spoke with pt and she stated she has a strong support at home with her boyfriend of 7 yrs and her 2 cats. She attends [employer] for daily activities and sees a therapist at [employer]). Pt spoke about not sleeping well last night and being very upset at the loss of the pregnancy and not knowing how to cope yet.

## 2011-08-04 NOTE — Progress Notes (Signed)
[facility] Group Notes:  (Counselor/Nursing/MHT/Case Management/Adjunct)  08/04/2011 0830  Type of Therapy:  Discharge Planning  Summary of Progress/Problems: Pt. attended and participated in aftercare planning group. [facility] support group information was given as well as information on suicide prevention information, warning signs to look for with suicide and crisis line numbers to use. Pt denies SI/HI.   [name], Desiree 08/04/2011, 3:07 PM

## 2011-08-04 NOTE — Progress Notes (Signed)
[facility] Group Notes:  (Counselor/Nursing/MHT/Case Management/Adjunct)  08/04/2011 6:36 PM  Type of Therapy:  Group Therapy  Participation Level:  Active  Participation Quality:  Appropriate  Affect:  Appropriate  Cognitive:  Appropriate  Insight:  Good  Engagement in Group:  Good  Engagement in Therapy:  Good  Modes of Intervention:  Clarification, Socialization and Support  Summary of Progress/Problems: Pt. participated in group discussion on  supports and who those supports are in their lives.  Pt. Spoke about her supports and how everyone in her family is not supportive. Pt. Spoke about taking this time in the hospital for herself.  Neila Gear 08/04/2011, 6:36 PM

## 2011-08-05 MED ORDER — ARIPIPRAZOLE 15 MG PO TABS
15.0000 mg | ORAL_TABLET | Freq: Every day | ORAL | Status: DC
  Administered 2011-08-06: 15 mg via ORAL
  Filled 2011-08-05 (×3): qty 1

## 2011-08-05 NOTE — Progress Notes (Signed)
In hallway on approach. Appears flat and depressed. Calm and cooperative with assessment. No acute distress noted. Stated she had a good day. Stated she got very emotional when talking with Nurse Practitioner, but referred to it as a good thing. Stated it felt good to release her pent up emotions. Support and encouragement provided. Stated she attended groups and felt like they were helpful. Did have some questions about medication changes. The changes were reviewed and pt verbalized understanding. Otherwise offered no questions or concerns. Denies SI/HI/AVH and contracted for safety. POC for the shift reviewed and understanding verbalized. Safety has been maintained with !Q15 minute observation. Will continue current POC.

## 2011-08-05 NOTE — Progress Notes (Signed)
Writer discussed hcg results with pt. Pt receptive to information and acknowledges the termination of the pregnancy as a reality as her levels decrease. Pt still experiencing some grief with her loss and reported a breakthrough, where she cried to herself today. It was dishearten for the pt to hear her baby referred to as a fetus vs a baby. Pt viewed this as her baby and disregarded the medical term fetus. Pt was given emotional support. Pt reports that a few of her family members don't support her and her decision of coming here to receive help. They didn't see it necessary. She even says that one of her family members doubt she was even pregnant and it hurts her. Pt reports that her bf is supportive and want her to get all of the help necessary during this time of need. Pt attends groups, cooperates, and is defiantly noted as being vested in her treatment. Continued support and availability as needed has been extended to this pt. Pt remains safe at this time with q47min checks.

## 2011-08-05 NOTE — Progress Notes (Addendum)
[facility] Group Notes: (Counselor/Nursing/MHT/Case Management/Adjunct) 08/05/2011   @  11:00am Overcoming Obstacles to Wellness   Type of Therapy:  Group Therapy  Participation Level:  Active  Participation Quality:  Attentive, Sharing, Appropriate  Affect:  Blunted  Cognitive:  Appropriate  Insight:  Limited  Engagement in Group: Good  Engagement in Therapy:  Limited  Modes of Intervention:  Support and Exploration  Summary of Progress/Problems: Heather [name] explored her feelings of vulnerability and sadness that her family has not been supportive of her. She was able to identify problems in her emotional and social wellness, but also to recognize that she has many other supports outside of her family. Heather [name] processed how others may experience depression, even though they seem "normal", but never share their troubles and how this can contribute unrealistic ideas about what it means to be "normal".  Heather [name] 08/05/2011  12:58pm      [facility] Group Notes: (Counselor/Nursing/MHT/Case Management/Adjunct) 08/05/2011   @1 :15pm From Suicide to [facility]  Type of Therapy:  Group Therapy  Participation Level:  Active  Participation Quality:  Attentive, Sharing, Appropriate  Affect:  Blunted  Cognitive:  Appropriate  Insight:  Limited  Engagement in Group: Good   Engagement in Therapy:  Good  Modes of Intervention:  Support and Exploration  Summary of Progress/Problems: Heather [name] processed grief over the loss of her unborn child. She stated that although she never knew the child, she had expectations of what the next months and years of her life would hold, and not that is not going to be. She stated that although she does not plan to act on it, when she thinks of the baby and her body returning to pre-pregnancy mode she wants to numb herself or go to sleep and never wake up. Heather [name] explained the picture she chose, which was a man with post - its covering his head. She  identified that this reminded her of having so much to take care of, and explored how she has been taking care of everyone else but not herself.   Heather [name] 08/05/2011 4:13 PM

## 2011-08-05 NOTE — Progress Notes (Addendum)
Pt states she had some pain in her RLQ abd this am, + for flatus, LBM 4/28. No current pain. Also states she had some dizziness when she got up from breakfast this am. Currently denies any dizziness as she stands at med window to get am meds. Not orthostatic this am. Encouraged pt to rise slowly when getting up out of chair or bed, and to give self a few minutes to stand still after standing up before walking. Also instructed to let staff know if abd pain returns and if "dizziness" continues.  Pt verbalized understanding.

## 2011-08-05 NOTE — Progress Notes (Signed)
Recreation Therapy Group Note  Date: 08/05/2011        Time: 1145       Group Topic/Focus: Patient invited to participate in animal assisted therapy. Pets as a coping skill and responsibility were discussed.   Participation Level: Active  Participation Quality: Monopolizing  Affect: Blunted  Cognitive: Alert   Additional Comments: Patient quickly volunteered that she was admitted to the hospital because of losing a pregnancy and the loss of a family member. Patient reports experiencing the life events or visiting the places discussed by most every patient in group.

## 2011-08-05 NOTE — Progress Notes (Addendum)
Patient seen during d/c planning group.  She reported having a difficult weekend but being a little better today.  She denies SI/HI.  Patient rates depression at six and and anxiety at four.  She advised that she does not feel safe to discharge home today  She informed Clinical research associate of having an appointment with her outpatient provider tomorrow and asked that follow up be rescheduled.  Writer rescheduled appointment as requested.    Per State Regulation 482.30 This chart was reviewed for medical necessity with respect to the patient's  Admission/Duration of Stay  [facility], LCSW @4 /29/2013    Next Review Date 08/08/11

## 2011-08-05 NOTE — Tx Team (Signed)
Interdisciplinary Treatment Plan Update (Adult)  Date:  08/05/2011  Time Reviewed:  10:30 AM   Progress in Treatment: Attending groups: Yes Participating in groups:  Yes Taking medication as prescribed:  Yes Tolerating medication: Yes Family/Significant othe contact made:  Yes, contact made with: Heather [name], [name] Patient understands diagnosis: Yes Discussing patient identified problems/goals with staff:  Yes Medical problems stabilized or resolved: Yes Denies suicidal/homicidal ideation: Yes Issues/concerns per patient self-inventory:  No  Other:  New problem(s) identified: None  Reason for Continuation of Hospitalization: Anxiety Depression Medication stabilization  Interventions implemented related to continuation of hospitalization:  Medication stabilization, safety checks q 15 mins, group attendance  Additional comments: [name] reports no SI today, but does not feel she would be safe if she was discharged today  Estimated length of stay: 1-2 days  Discharge Plan:  Discharge home, follow up with Arna Medici as well as Loree Fee  New goal(s):  Review of initial/current patient goals per problem list:    1. Goal(s): Decrease depressive symptoms to rating of 4 or less  Met: No  Target date: by discharge  As evidenced by: [name] rates depression at  6 today   2. Goal (s): Decrease anxiety symptoms to a rating to 4 or less  Met: Yes  Target date: by discharge  As evidenced by: [name] rates anxiety at 4 today   3. Goal(s): Reduce potential for self-harm/suicide  Met: Yes  Target date: by discharge  As evidenced by: [name] denies any thoughts of suicide   4. Goal(s): Address medical condidtions  Met: No  Target date: DEFERRED  As evidenced by: [facility] does not address medical concerns, will refer back to medical    Attendees: Patient:     Family:     Physician:    Nursing:      Case Manager:  Juline Patch, LCSW 08/05/2011 10:30 AM  Counselor:  Angus Palms, LCSW 08/05/2011 10:30 AM  Other:  Reyes Ivan, LCSWA 08/05/2011 10:30 AM  Other:  Serena Colonel, NP 08/05/2011 10:30 AM  Other:  Corinne Ports, Doctoral Psych Intern 08/05/2011 10:30 AM  Other:  Elvia Collum, Patient Experience Manager 08/05/2011 10:30 AM   Scribe for Treatment Team:   [name], 08/05/2011 10:30 AM

## 2011-08-05 NOTE — Progress Notes (Addendum)
[facility] MD Progress Note  08/05/2011 4:45 PM  S: I am wondering if I can be changed to something else other than Abilify. I am not feeling any better. I miss having this baby. I was told that I was not never going to be able to have a baby. When I was told that I was pregnant, I was happy, then turn around, it was gone"  Diagnosis:   Axis I: Scizoaffective disorder, bipolar type Axis II: Deferred Axis III:  Past Medical History  Diagnosis Date  . Amenorrhea   . Bipolar 1 disorder   . Dysmenorrhea   . COPD (chronic obstructive pulmonary disease)   . Dysrhythmia     DR Eden Emms    . Asthma   . Sleep apnea     CPAP  . Shortness of breath     WITH EXERTION   . GERD (gastroesophageal reflux disease)     HEARTBURN   TUMS  . Neuromuscular disorder     RESTLESS LEG   . Depression    Axis IV: No changes Axis V: 51-60 moderate symptoms  ADL's:  Intact  Sleep: Good  Appetite:  Good  Suicidal Ideation:  Plan:  No Intent:  No Means:  No Homicidal Ideation:  Plan:  No Intent:  No Means:  No  AEB (as evidenced by): Per patient's report  Mental Status Examination/Evaluation: Objective:  Appearance: Casual  Eye Contact::  Good  Speech:  Clear and Coherent  Volume:  Normal  Mood:  Depressed  Affect:  Flat  Thought Process:  Coherent  Orientation:  Full  Thought Content:  Rumination  Suicidal Thoughts:  No  Homicidal Thoughts:  No  Memory:  Immediate;   Good Recent;   Good Remote;   Good  Judgement:  Fair  Insight:  Fair  Psychomotor Activity:  Normal  Concentration:  Good  Recall:  Good  Akathisia:  No  Handed:  Right  AIMS (if indicated):     Assets:  Desire for Improvement  Sleep:  Number of Hours: 6.25    Vital Signs:Blood pressure 144/81, pulse 96, temperature 96.5 F (35.8 C), temperature source Oral, resp. rate 16, last menstrual period 06/29/2011, unknown if currently breastfeeding. Current Medications: Current Facility-Administered Medications  Medication  Dose Route Frequency Provider Last Rate Last Dose  . acetaminophen (TYLENOL) tablet 650 mg  650 mg Oral Q6H PRN Verne Spurr, PA-C   650 mg at 08/04/11 6962  . alum & mag hydroxide-simeth (MAALOX/MYLANTA) 200-200-20 MG/5ML suspension 30 mL  30 mL Oral Q4H PRN Verne Spurr, PA-C      . ARIPiprazole (ABILIFY) tablet 10 mg  10 mg Oral Daily Sanjuana Kava, NP   10 mg at 08/05/11 9528  . buPROPion (WELLBUTRIN XL) 24 hr tablet 150 mg  150 mg Oral Daily Sanjuana Kava, NP   150 mg at 08/05/11 4132  . busPIRone (BUSPAR) tablet 10 mg  10 mg Oral BID Verne Spurr, PA-C   10 mg at 08/05/11 4401  . LORazepam (ATIVAN) tablet 0.5 mg  0.5 mg Oral Q8H PRN Sanjuana Kava, NP   0.5 mg at 08/04/11 2150  . magnesium hydroxide (MILK OF MAGNESIA) suspension 30 mL  30 mL Oral Daily PRN Verne Spurr, PA-C      . ondansetron (ZOFRAN) tablet 4 mg  4 mg Oral Q8H PRN Verne Spurr, PA-C      . traZODone (DESYREL) tablet 150 mg  150 mg Oral Q2000 Sanjuana Kava, NP   150  mg at 08/03/11 2110    Lab Results:  Results for orders placed during the hospital encounter of 08/02/11 (from the past 48 hour(s))  HCG, SERUM, QUALITATIVE     Status: Abnormal   Collection Time   08/03/11  7:27 PM      Component Value Range Comment   Preg, Serum POSITIVE (*) NEGATIVE      Physical Findings: AIMS:  , ,  ,  ,    CIWA:    COWS:     Treatment Plan Summary: Daily contact with patient to assess and evaluate symptoms and progress in treatment Medication management  Plan: Increase Abilify to 15 mg daily. Continue current treatment plan.  Armandina Stammer I 08/05/2011, 4:45 PM

## 2011-08-06 DIAGNOSIS — F4320 Adjustment disorder, unspecified: Secondary | ICD-10-CM | POA: Diagnosis present

## 2011-08-06 DIAGNOSIS — F4321 Adjustment disorder with depressed mood: Secondary | ICD-10-CM | POA: Diagnosis present

## 2011-08-06 DIAGNOSIS — F259 Schizoaffective disorder, unspecified: Principal | ICD-10-CM

## 2011-08-06 LAB — COMPREHENSIVE METABOLIC PANEL
Albumin: 3.5 g/dL (ref 3.5–5.2)
BUN: 12 mg/dL (ref 6–23)
Creatinine, Ser: 0.71 mg/dL (ref 0.50–1.10)
Total Bilirubin: 0.2 mg/dL — ABNORMAL LOW (ref 0.3–1.2)
Total Protein: 7.2 g/dL (ref 6.0–8.3)

## 2011-08-06 MED ORDER — ZIPRASIDONE HCL 20 MG PO CAPS
20.0000 mg | ORAL_CAPSULE | Freq: Two times a day (BID) | ORAL | Status: AC
  Administered 2011-08-06 – 2011-08-07 (×2): 20 mg via ORAL
  Filled 2011-08-06 (×4): qty 1

## 2011-08-06 MED ORDER — TRAZODONE HCL 100 MG PO TABS
100.0000 mg | ORAL_TABLET | Freq: Every day | ORAL | Status: DC
  Administered 2011-08-06: 100 mg via ORAL
  Filled 2011-08-06 (×3): qty 1

## 2011-08-06 MED ORDER — VITAMIN E 180 MG (400 UNIT) PO CAPS
400.0000 [IU] | ORAL_CAPSULE | Freq: Every day | ORAL | Status: DC
  Administered 2011-08-07 – 2011-08-12 (×6): 400 [IU] via ORAL
  Filled 2011-08-06 (×5): qty 1
  Filled 2011-08-06: qty 7
  Filled 2011-08-06 (×4): qty 1

## 2011-08-06 MED ORDER — ZIPRASIDONE HCL 60 MG PO CAPS
60.0000 mg | ORAL_CAPSULE | Freq: Two times a day (BID) | ORAL | Status: DC
  Administered 2011-08-08 – 2011-08-12 (×8): 60 mg via ORAL
  Filled 2011-08-06 (×12): qty 1

## 2011-08-06 MED ORDER — ZIPRASIDONE HCL 40 MG PO CAPS
40.0000 mg | ORAL_CAPSULE | Freq: Two times a day (BID) | ORAL | Status: AC
  Administered 2011-08-07 – 2011-08-08 (×2): 40 mg via ORAL
  Filled 2011-08-06 (×2): qty 1

## 2011-08-06 NOTE — Progress Notes (Signed)
Patient up and in the milieu most of the day.  Interacting well with staff and peers.  Attending groups.  Affect remains flat, patient continues to be depressed but without anxiety.  Complains of some back pain this morning.  Encouraged her to be up and active during the day.

## 2011-08-06 NOTE — Progress Notes (Signed)
Grief and Loss Group  The group focused on awareness of loss and patient's grief reactions. There was open sharing and support provided between patients as well as by leader. Identification of feelings and patterns of reacting, the need for support and exploration of productive and healthy ways of self care and support.   Pt. Talked about a recent tubular pregnancy and her sadness that she was not able to have a viable pregnancy and the chance to be a mother as are her other friends. She also indicated that this is the first time in several years that she has been hospitalized and seem to feel that this was also a source of grief, i.e., that she has not been able to maintain to manage her life. This seem to worry her. She was active in the group conversation.   Heather [name] [phone]

## 2011-08-06 NOTE — Progress Notes (Signed)
[facility] MD Progress Note  08/06/2011 4:57 PM  Diagnosis:  Axis I: Adjustment Disorder with Depressed Mood and Schizoaffective Disorder  ADL's:  Intact  Sleep: Poor  Appetite:  Good  Suicidal Ideation:  Pt had suicidal thoughts yesterday.  She contracts for safety now. Homicidal Ideation:  Denies adamantly any homicidal thoughts.  Mental Status Examination/Evaluation: Objective:  Appearance: Casual  Eye Contact::  Good  Speech:  Clear and Coherent  Volume:  Normal  Mood:  Anxious and Dysphoric  Affect:  Congruent  Thought Process:  Coherent  Orientation:  Full  Thought Content:  WDL  Suicidal Thoughts:  No  Homicidal Thoughts:  No  Memory:  Immediate;   Fair  Judgement:  Fair  Insight:  Fair  Psychomotor Activity:  Normal  Concentration:  Fair  Recall:  Fair  Akathisia:  No  AIMS (if indicated):     Assets:  Communication Skills Desire for Improvement  Sleep:  Number of Hours: 6.25     ROS: Neuro: some racing thoughts perhaps related to the Abilify not working well.  Pt desires to shift back to Geodon.  She noted some lightheadedness.  BP seems fine.  Will observe this.  GU: Pt has chemical abortion last week for what seemed by ultrasound to be a tubal pregnancy.  She has not noted any bloody discharge or tissue passing at this time.  Will order another Beta HCG for Thursday.  That was what was needed by her OB doctor.    MS:  Pt is overweight and has some disc pathology going on.  She notes (R) leg symptoms with edema.  Ordered a CMET to see if there is any explanation for that in there.  Otherwise she notes no aches, soreness, or weakness.  Vital Signs:Blood pressure 119/76, pulse 103, temperature 96.6 F (35.9 C), temperature source Oral, resp. rate 20, last menstrual period 06/29/2011, unknown if currently breastfeeding. Current Medications: Current Facility-Administered Medications  Medication Dose Route Frequency Provider Last Rate Last Dose  . acetaminophen  (TYLENOL) tablet 650 mg  650 mg Oral Q6H PRN Heather Spurr, PA-C   650 mg at 08/06/11 0651  . alum & mag hydroxide-simeth (MAALOX/MYLANTA) 200-200-20 MG/5ML suspension 30 mL  30 mL Oral Q4H PRN Heather Spurr, PA-C      . buPROPion (WELLBUTRIN XL) 24 hr tablet 150 mg  150 mg Oral Daily Sanjuana Kava, NP   150 mg at 08/06/11 0830  . busPIRone (BUSPAR) tablet 10 mg  10 mg Oral BID Heather Spurr, PA-C   10 mg at 08/06/11 0830  . LORazepam (ATIVAN) tablet 0.5 mg  0.5 mg Oral Q8H PRN Sanjuana Kava, NP   0.5 mg at 08/04/11 2150  . magnesium hydroxide (MILK OF MAGNESIA) suspension 30 mL  30 mL Oral Daily PRN Heather Spurr, PA-C      . ondansetron (ZOFRAN) tablet 4 mg  4 mg Oral Q8H PRN Heather Spurr, PA-C      . traZODone (DESYREL) tablet 100 mg  100 mg Oral Q2000 Mike Craze, MD      . vitamin E capsule 400 Units  400 Units Oral Daily Mike Craze, MD      . ziprasidone (GEODON) capsule 20 mg  20 mg Oral BID WC Mike Craze, MD       Followed by  . ziprasidone (GEODON) capsule 40 mg  40 mg Oral BID WC Mike Craze, MD       Followed by  . ziprasidone (GEODON)  capsule 60 mg  60 mg Oral BID WC Mike Craze, MD      . DISCONTD: ARIPiprazole (ABILIFY) tablet 15 mg  15 mg Oral Daily Sanjuana Kava, NP   15 mg at 08/06/11 0830  . DISCONTD: traZODone (DESYREL) tablet 150 mg  150 mg Oral Q2000 Sanjuana Kava, NP   150 mg at 08/05/11 2121    Lab Results: No results found for this or any previous visit (from the past 48 hour(s)).  Physical Findings: AIMS:  , ,  ,  ,    CIWA:    COWS:     Treatment Plan Summary: Daily contact with patient to assess and evaluate symptoms and progress in treatment Medication management  Plan: Will get Beta HCG for the weekly level on Thursday.  She has noted no blood or tissue discharge since the induction of her chemical abortion last week. Will have nurse practitioner check out her (R) leg edema.  Pt has a slight flicker of her (L) upper lip that could be  suggestive of Tardive Dyskinesia.  She has a smooth tongue without noticeable fasciculations.  Her wrists are smooth and elbows seem smooth, yet she has noted some (R) elbow stiffness at times. Will start Vit E 400 I.U. For this.  Benn Tarver 08/06/2011, 4:57 PM

## 2011-08-06 NOTE — Progress Notes (Signed)
Patient seen during discharge planning group.  She advised of having a difficult night due to racing thoughts.  Patient shared she had SI yesterday with thoughts of ODing on pills but no pills were available to her.  She denies SI today and contract for safety should she become suicidal.  Patient rates depression at six and anxiety, hopelessness and helpless at five.  Patient states she is not safe to d/c home.

## 2011-08-06 NOTE — Progress Notes (Signed)
Has been visible in milieu and interacting appropriately with peers. Appears flat and depressed but does brighten at intervals. Calm and cooperative with assessment. No acute distress noted. States she had a much better day today. States she was able to talk with MD about Resuming Geodon and he was agreeable. States she feels like she felt the best when she was taking it before. Also glad MD is tapering her Trazodone as it was a little too sedating for her. Otherwise offered no questions or concerns. Denies SI/HI/AVH and contracts for safety. POC and medications for the shift reviewed and understanding verbalized. Safety has been maintained with Q15 minute observation. Will continue current POC.

## 2011-08-06 NOTE — Progress Notes (Addendum)
[facility] Group Notes: (Counselor/Nursing/MHT/Case Management/Adjunct) 08/06/2011   @ 11:00 Feelings About Diagnosis  Type of Therapy:  Group Therapy  Participation Level:   Minimal  Participation Quality: Attentive    Affect:  Blunted  Cognitive:  Appropriate  Insight:  None  Engagement in Group: Minimal  Engagement in Therapy:  None  Modes of Intervention:  Support and Exploration  Summary of Progress/Problems: Heather [name] was attentive but not personally engaged in group process. She did state very briefly that she could relate to the sense of loss discussed by other group members and asked for resources for parents who have lost children.   [name] 08/06/2011 12:19 PM    [facility] Group Notes: (Counselor/Nursing/MHT/Case Management/Adjunct) 08/06/2011   @1 :15pm Breathing & Meditation for Anxiety/Anger   Type of Therapy:  Group Therapy  Participation Level:  Active  Participation Quality: Attentive, Sharing, Appropriate    Affect:  Appropriate  Cognitive:  Appropriate  Insight:  Good  Engagement in Group: Good  Engagement in Therapy: Good  Modes of Intervention:  Support and Exploration  Summary of Progress/Problems: Heather [name] took part in focused breathing and progressive muscle relaxation exercises. She processed her experience, saying that they helped her relax and are something she can incorporate into everyday life. [name] asked for a copy of the meditation CD for home use.  [name] 08/06/2011 4:15 PM

## 2011-08-07 MED ORDER — PRAZOSIN HCL 1 MG PO CAPS
1.0000 mg | ORAL_CAPSULE | Freq: Every evening | ORAL | Status: DC | PRN
  Administered 2011-08-07: 1 mg via ORAL
  Filled 2011-08-07 (×7): qty 1

## 2011-08-07 MED ORDER — POTASSIUM CHLORIDE CRYS ER 10 MEQ PO TBCR
10.0000 meq | EXTENDED_RELEASE_TABLET | Freq: Once | ORAL | Status: AC
  Administered 2011-08-07: 10 meq via ORAL
  Filled 2011-08-07 (×2): qty 1

## 2011-08-07 MED ORDER — FUROSEMIDE 20 MG PO TABS
20.0000 mg | ORAL_TABLET | Freq: Every day | ORAL | Status: DC
  Administered 2011-08-07 – 2011-08-12 (×6): 20 mg via ORAL
  Filled 2011-08-07 (×9): qty 1

## 2011-08-07 NOTE — Progress Notes (Signed)
Patient ID: Heather [name], female   DOB: February 23, 1976, 36 y.o.   MRN: [id number] Pt. Reports going home tomorrow expressing some anxiety. "I'm a little worried, but I know I got to go, got to face things." Pt. Rates depression at "3" of 10. Pt. Has positive out look, reports "I'm just going to hope and pray maybe I'll get pregnant again." Pt. Says humorously "but time is running out I'm 35 and my boyfriend is 40 so time is running out for him." Pt. Denies SHI. Staff will monitor q43min for safety.

## 2011-08-07 NOTE — Progress Notes (Signed)
[facility] MD Progress Note  08/07/2011 4:23 PM  S: "My right foot and ankle are swollen. It happened to my Lt. Foot in the past. I took lasix for it"  O: Rt. Foot edema, non-pitting, palpable pulses. No redness present.  Diagnosis:   Axis I: Adjustment Disorder with Depressed Mood and Schizoaffective Disorder, Bipolar type. Axis II: Deferred Axis III:  Past Medical History  Diagnosis Date  . Amenorrhea   . Bipolar 1 disorder   . Dysmenorrhea   . COPD (chronic obstructive pulmonary disease)   . Dysrhythmia     DR Eden Emms    . Asthma   . Sleep apnea     CPAP  . Shortness of breath     WITH EXERTION   . GERD (gastroesophageal reflux disease)     HEARTBURN   TUMS  . Neuromuscular disorder     RESTLESS LEG   . Depression    Axis IV: No changes Axis V: 51-60 moderate symptoms  ADL's:  Intact  Sleep: Good  Appetite:  Good  Suicidal Ideation:  Plan:  No Intent:  No Means:  no Homicidal Ideation:  Plan:  No Intent:  no Means:  No  AEB (as evidenced by):  Mental Status Examination/Evaluation: Objective:  Appearance: Casual  Eye Contact::  Good  Speech:  Clear and Coherent  Volume:  Normal  Mood:  Euthymic  Affect:  Appropriate  Thought Process:  Circumstantial  Orientation:  Full  Thought Content:  Rumination  Suicidal Thoughts:  No  Homicidal Thoughts:  No  Memory:  Immediate;   Good Recent;   Good Remote;   Good  Judgement:  Fair  Insight:  Fair  Psychomotor Activity:  Normal  Concentration:  Good  Recall:  Good  Akathisia:  No  Handed:  Right  AIMS (if indicated):     Assets:  Desire for Improvement  Sleep:  Number of Hours: 6.75    Vital Signs:Blood pressure 134/84, pulse 77, temperature 97.1 F (36.2 C), temperature source Oral, resp. rate 24, last menstrual period 06/29/2011, unknown if currently breastfeeding. Current Medications: Current Facility-Administered Medications  Medication Dose Route Frequency Provider Last Rate Last Dose  .  acetaminophen (TYLENOL) tablet 650 mg  650 mg Oral Q6H PRN Verne Spurr, PA-C   650 mg at 08/06/11 0651  . alum & mag hydroxide-simeth (MAALOX/MYLANTA) 200-200-20 MG/5ML suspension 30 mL  30 mL Oral Q4H PRN Verne Spurr, PA-C   30 mL at 08/07/11 1058  . buPROPion (WELLBUTRIN XL) 24 hr tablet 150 mg  150 mg Oral Daily Sanjuana Kava, NP   150 mg at 08/07/11 0801  . busPIRone (BUSPAR) tablet 10 mg  10 mg Oral BID Verne Spurr, PA-C   10 mg at 08/07/11 0801  . furosemide (LASIX) tablet 20 mg  20 mg Oral Daily Sanjuana Kava, NP      . LORazepam (ATIVAN) tablet 0.5 mg  0.5 mg Oral Q8H PRN Sanjuana Kava, NP   0.5 mg at 08/04/11 2150  . magnesium hydroxide (MILK OF MAGNESIA) suspension 30 mL  30 mL Oral Daily PRN Verne Spurr, PA-C      . ondansetron [facility]) tablet 4 mg  4 mg Oral Q8H PRN Verne Spurr, PA-C      . traZODone (DESYREL) tablet 100 mg  100 mg Oral Q2000 Mike Craze, MD   100 mg at 08/06/11 2110  . vitamin E capsule 400 Units  400 Units Oral Daily Mike Craze, MD   400  Units at 08/07/11 0801  . ziprasidone (GEODON) capsule 20 mg  20 mg Oral BID WC Mike Craze, MD   20 mg at 08/07/11 0801   Followed by  . ziprasidone (GEODON) capsule 40 mg  40 mg Oral BID WC Mike Craze, MD       Followed by  . ziprasidone (GEODON) capsule 60 mg  60 mg Oral BID WC Mike Craze, MD      . DISCONTD: ARIPiprazole (ABILIFY) tablet 15 mg  15 mg Oral Daily Sanjuana Kava, NP   15 mg at 08/06/11 0830  . DISCONTD: traZODone (DESYREL) tablet 150 mg  150 mg Oral Q2000 Sanjuana Kava, NP   150 mg at 08/05/11 2121    Lab Results:  Results for orders placed during the hospital encounter of 08/02/11 (from the past 48 hour(s))  COMPREHENSIVE METABOLIC PANEL     Status: Abnormal   Collection Time   08/06/11  7:05 PM      Component Value Range Comment   Sodium 137  135 - 145 (mEq/L)    Potassium 4.1  3.5 - 5.1 (mEq/L)    Chloride 101  96 - 112 (mEq/L)    CO2 26  19 - 32 (mEq/L)    Glucose, Bld 110 (*)  70 - 99 (mg/dL)    BUN 12  6 - 23 (mg/dL)    Creatinine, Ser 1.61  0.50 - 1.10 (mg/dL)    Calcium 9.5  8.4 - 10.5 (mg/dL)    Total Protein 7.2  6.0 - 8.3 (g/dL)    Albumin 3.5  3.5 - 5.2 (g/dL)    AST 21  0 - 37 (U/L)    ALT 29  0 - 35 (U/L)    Alkaline Phosphatase 60  39 - 117 (U/L)    Total Bilirubin 0.2 (*) 0.3 - 1.2 (mg/dL)    GFR calc non Af Amer >90  >90 (mL/min)    GFR calc Af Amer >90  >90 (mL/min)     Physical Findings: AIMS:  , ,  ,  ,    CIWA:    COWS:     Treatment Plan Summary: Daily contact with patient to assess and evaluate symptoms and progress in treatment Medication management  Plan: Start furosemide 20 mg daily, and kdur 10 meq daily.           Instructed to keep feet elevated when up in a chair and or lying down.           Continue current treatment plan.        Armandina Stammer I 08/07/2011, 4:23 PM

## 2011-08-07 NOTE — Progress Notes (Signed)
  08/07/2011         Time: 1415      Group Topic/Focus: The focus of this group is on discussing various styles of communication and communicating assertively using 'I' (feeling) statements.  Participation Level: Active  Participation Quality: Sharing  Affect: Blunted  Cognitive: Oriented   Additional Comments: Patient open about how hurt she feels when people don't see her "caring heart" because of her weight, reported feeling supported when several other group members shared the same concerns.   Leman Martinek 08/07/2011 3:58 PM

## 2011-08-07 NOTE — Progress Notes (Signed)
Patient ID: Heather [name], female   DOB: October 16, 1975, 36 y.o.   MRN: [id number] Pt notes that Trazodone has not worked for her in the past and requests to change that.  Will try Minipress for that.  She says that she has now gone 24 hours without SI.  Will consider D/C on Friday.

## 2011-08-07 NOTE — Progress Notes (Signed)
Pt has been up for all groups today and has been interacting with peers and staff appropriate.  She rated her depression 5 hopelessness a 4 and her anxiety a 5 on her self-inventory.  She denies any S/H ideations.  She plans to f/u at [facility] and her therapist when she discharges.  She c/o some LE edema so Aggie NP ordered lasix 20 mg daily which she received the first dose around 1630.  She also had a one-time dose of k-dur 10 meq which was given at the same time as her lasix.  She did request some maalox at 41 which was given at 1058.   Pt wanted her trazodone changed and Dr. Dan Humphreys has ordered her prazosin 1 mg at bedtime and may repeat times one.  Pt is aware and voiced understanding.  Possible d/c by Friday

## 2011-08-08 DIAGNOSIS — F489 Nonpsychotic mental disorder, unspecified: Secondary | ICD-10-CM | POA: Diagnosis present

## 2011-08-08 DIAGNOSIS — G2581 Restless legs syndrome: Secondary | ICD-10-CM | POA: Diagnosis present

## 2011-08-08 DIAGNOSIS — F5105 Insomnia due to other mental disorder: Secondary | ICD-10-CM | POA: Diagnosis present

## 2011-08-08 LAB — HCG, QUANTITATIVE, PREGNANCY: hCG, Beta Chain, Quant, S: 37 m[IU]/mL — ABNORMAL HIGH (ref <5)

## 2011-08-08 MED ORDER — PRAZOSIN HCL 1 MG PO CAPS
2.0000 mg | ORAL_CAPSULE | Freq: Every day | ORAL | Status: DC
  Administered 2011-08-08 – 2011-08-11 (×4): 2 mg via ORAL
  Filled 2011-08-08: qty 1
  Filled 2011-08-08: qty 2
  Filled 2011-08-08: qty 1
  Filled 2011-08-08 (×3): qty 2
  Filled 2011-08-08: qty 1

## 2011-08-08 MED ORDER — ROPINIROLE HCL 0.25 MG PO TABS
0.2500 mg | ORAL_TABLET | Freq: Every day | ORAL | Status: AC
  Administered 2011-08-08 – 2011-08-09 (×2): 0.25 mg via ORAL
  Filled 2011-08-08 (×3): qty 1

## 2011-08-08 MED ORDER — PRAZOSIN HCL 1 MG PO CAPS
1.0000 mg | ORAL_CAPSULE | Freq: Every evening | ORAL | Status: DC | PRN
  Filled 2011-08-08: qty 2

## 2011-08-08 MED ORDER — ROPINIROLE HCL 0.25 MG PO TABS
0.5000 mg | ORAL_TABLET | Freq: Every day | ORAL | Status: DC
  Administered 2011-08-10 – 2011-08-11 (×2): 0.5 mg via ORAL
  Filled 2011-08-08 (×2): qty 1
  Filled 2011-08-08: qty 2
  Filled 2011-08-08 (×2): qty 1

## 2011-08-08 MED ORDER — BUPROPION HCL ER (XL) 300 MG PO TB24
300.0000 mg | ORAL_TABLET | Freq: Every day | ORAL | Status: DC
  Administered 2011-08-09 – 2011-08-12 (×4): 300 mg via ORAL
  Filled 2011-08-08 (×6): qty 1

## 2011-08-08 NOTE — Progress Notes (Signed)
[facility] MD Progress Note  08/08/2011 10:31 PM  Diagnosis:  Axis I: Adjustment Disorder with Depressed Mood and Schizoaffective Disorder  ADL's:  Intact  Sleep: Poor  Appetite:  Good  Suicidal Ideation:  Pt denied any suicidal thoughts today.  She did have a panic attack this AM after group.  She also got depressed when she got MORE news that her pregnancy is over with the Beta HCG going down to 37. Homicidal Ideation:  Denies adamantly any homicidal thoughts.  Mental Status Examination/Evaluation: Objective:  Appearance: Casual  Eye Contact::  Good  Speech:  Clear and Coherent  Volume:  Normal  Mood:  Anxious and Depressed  Affect:  Tearful  Thought Process:  Coherent  Orientation:  Full  Thought Content:  WDL  Suicidal Thoughts:  No, but sees a very low level of personal purpose now that her pregnancy has been terminated.  Homicidal Thoughts:  No  Memory:  Immediate;   Fair  Judgement:  Fair  Insight:  Fair  Psychomotor Activity:  Normal  Concentration:  Fair  Recall:  Fair  Akathisia:  No  AIMS (if indicated):     Assets:  Communication Skills Desire for Improvement  Sleep:  Number of Hours: 6.75     ROS: Neuro: is on an escalating dose of Geodon.  Will consider trial of Thorazine for anxiety if the Geodon by itself does not work for her panic attacks.  Some lightheadedness today.  She denies a personal history of seizures.  (Will increase her Wellbutrin for her persistent depression.)  GU: Her beta HCG was 37 today.  Her OB says that the level will have to be repeated weekly until the value is less than 2.  He does not feel that the leg edema is related to that.  She is on Lasix for that.  MS:  Some edema persists, no aches, soreness, or weakness.  Vital Signs:Blood pressure 128/81, pulse 90, temperature 97.4 F (36.3 C), temperature source Oral, resp. rate 16, last menstrual period 06/29/2011, unknown if currently breastfeeding. Current Medications: Current  Facility-Administered Medications  Medication Dose Route Frequency Provider Last Rate Last Dose  . acetaminophen (TYLENOL) tablet 650 mg  650 mg Oral Q6H PRN Verne Spurr, PA-C   650 mg at 08/06/11 0651  . alum & mag hydroxide-simeth (MAALOX/MYLANTA) 200-200-20 MG/5ML suspension 30 mL  30 mL Oral Q4H PRN Verne Spurr, PA-C   30 mL at 08/07/11 1058  . buPROPion (WELLBUTRIN XL) 24 hr tablet 300 mg  300 mg Oral Daily Mike Craze, MD      . busPIRone (BUSPAR) tablet 10 mg  10 mg Oral BID Verne Spurr, PA-C   10 mg at 08/08/11 1716  . furosemide (LASIX) tablet 20 mg  20 mg Oral Daily Sanjuana Kava, NP   20 mg at 08/08/11 0753  . LORazepam (ATIVAN) tablet 0.5 mg  0.5 mg Oral Q8H PRN Sanjuana Kava, NP   0.5 mg at 08/08/11 0903  . magnesium hydroxide (MILK OF MAGNESIA) suspension 30 mL  30 mL Oral Daily PRN Verne Spurr, PA-C      . ondansetron [facility]) tablet 4 mg  4 mg Oral Q8H PRN Verne Spurr, PA-C      . prazosin (MINIPRESS) capsule 1 mg  1 mg Oral QHS PRN Mike Craze, MD      . prazosin (MINIPRESS) capsule 2 mg  2 mg Oral QHS Mike Craze, MD   2 mg at 08/08/11 2155  . rOPINIRole (REQUIP)  tablet 0.25 mg  0.25 mg Oral QHS Mike Craze, MD   0.25 mg at 08/08/11 2156   Followed by  . rOPINIRole (REQUIP) tablet 0.5 mg  0.5 mg Oral QHS Mike Craze, MD      . vitamin E capsule 400 Units  400 Units Oral Daily Mike Craze, MD   400 Units at 08/08/11 0753  . ziprasidone (GEODON) capsule 40 mg  40 mg Oral BID WC Mike Craze, MD   40 mg at 08/08/11 0753   Followed by  . ziprasidone (GEODON) capsule 60 mg  60 mg Oral BID WC Mike Craze, MD   60 mg at 08/08/11 1716  . DISCONTD: buPROPion (WELLBUTRIN XL) 24 hr tablet 150 mg  150 mg Oral Daily Sanjuana Kava, NP   150 mg at 08/08/11 0753  . DISCONTD: prazosin (MINIPRESS) capsule 1 mg  1 mg Oral QHS,MR X 1 Mike Craze, MD   1 mg at 08/07/11 2156    Lab Results:  Results for orders placed during the hospital encounter of 08/02/11  (from the past 48 hour(s))  HCG, QUANTITATIVE, PREGNANCY     Status: Abnormal   Collection Time   08/08/11  6:21 AM      Component Value Range Comment   hCG, Beta Chain, Quant, S 37 (*) <5 (mIU/mL)     Physical Findings: AIMS:  , ,  ,  ,    CIWA:    COWS:     Treatment Plan Summary: Daily contact with patient to assess and evaluate symptoms and progress in treatment Medication management  Plan: Will order Requip for her restless leg syndrome. Will increase the Wellbutrin for her depression. Will increase the Minipress for her insomnia.  Corinda Ammon 08/08/2011, 10:31 PM

## 2011-08-08 NOTE — Progress Notes (Signed)
Patient ID: Heather [name], female   DOB: September 16, 1975, 36 y.o.   MRN: [id number] Pt. Reports depression at "4" of 10. Pt. Reports she has received a list of support groups from the case manager that she plans to chose for grief. Pt. Denies SHI. Pt. Prepares for karaoke. Staff will monitor q44min for safety.
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
## 2011-08-08 NOTE — Progress Notes (Addendum)
Pt attended discharge planning group and actively participated.  Pt presents with flat affect and depressed mood.  Pt ranks depression at a 4 and anxiety at a 7 today.  Pt denies SI.  Pt states that she is very anxious right now and is having a panic attack at this moment.  SW notes that pt appears calm and doesn't appear to be anxious.  Pt states that she is trying to stay calm.  Pt has follow up scheduled.  No further needs voiced by pt at this time.  Safety planning and suicide prevention discussed.    Heather [name], LCSWA 08/08/2011  10:27 AM    Per State Regulation 482.30 This Chart was reviewed for medical necessity with respect to the patient's Admission/Duration of stay.   Heather [name]  08/08/2011  Next Review Date:  08/11/11

## 2011-08-08 NOTE — Progress Notes (Signed)
Pt rates depression at a 4 and anxiety at a 7. Pt attends groups and interacts well with peers and staff. Pt was offered support and encouragement. Pt denies SI/HI. Pt is receptive to treatment and safety is maintained on unit.

## 2011-08-08 NOTE — Tx Team (Signed)
Interdisciplinary Treatment Plan Update (Adult)  Date:  08/08/2011  Time Reviewed:  11:05 AM   Progress in Treatment: Attending groups: Yes Participating in groups:  Yes Taking medication as prescribed: Yes Tolerating medication:  Yes Family/Significant other contact made:  Yes Patient understands diagnosis:  Yes Discussing patient identified problems/goals with staff:  Yes Medical problems stabilized or resolved:  Yes Denies suicidal/homicidal ideation: Yes Issues/concerns per patient self-inventory:  None identified Other: N/A  New problem(s) identified: None Identified  Reason for Continuation of Hospitalization: Anxiety Depression Medication stabilization  Interventions implemented related to continuation of hospitalization: mood stabilization, medication monitoring and adjustment, group therapy and psycho education, safety checks q 15 mins  Additional comments: N/A  Estimated length of stay: 1-2 days  Discharge Plan: Heather [name] will follow up with Arna Medici for medication management and therapy.    New goal(s): N/A  Review of initial/current patient goals per problem list:   1. Goal(s): Decrease depressive symptoms to rating of 4 or less  Met: Yes Target date: by discharge  As evidenced by: [name] rates depression at 4 today 2. Goal (s): Decrease anxiety symptoms to a rating to 4 or less  Met: No Target date: by discharge  As evidenced by: [name] rates anxiety at 7 today    Attendees: Patient:     Family:     Physician:  Orson Aloe, MD  08/08/2011  11:05 AM   Nursing:   Omelia Blackwater, RN 08/08/2011 11:08 AM   Case Manager:  Reyes Ivan, LCSWA 08/08/2011  11:05 AM   Counselor:  Angus Palms, LCSW 08/08/2011  11:05 AM   Other:  Juline Patch, LCSW 08/08/2011  11:05 AM   Other:  Serena Colonel, NP 08/08/2011  11:05 AM   Other:  Chinita Greenland, RN 08/08/2011 11:08 AM   Other:      Scribe for Treatment Team:   Carmina Miller, 08/08/2011 , 11:05 AM

## 2011-08-09 NOTE — Progress Notes (Signed)
[facility] MD Progress Note  08/09/2011 4:54 PM  Diagnosis:  Axis I: Adjustment Disorder with Depressed Mood and Schizoaffective Disorder  ADL's:  Intact  Sleep: Poor  Appetite:  Good  Suicidal Ideation:  Pt denied any suicidal thoughts today. Homicidal Ideation:  Denies adamantly any homicidal thoughts.  Mental Status Examination/Evaluation: Objective:  Appearance: Casual  Eye Contact::  Good  Speech:  Clear and Coherent  Volume:  Normal  Mood:  Anxious and Depressed  Affect:  Congruent  Thought Process:  Coherent  Orientation:  Full  Thought Content:  WDL  Suicidal Thoughts:  No, still sees a very low level of personal purpose now that her pregnancy has been terminated.  Homicidal Thoughts:  No  Memory:  Immediate;   Fair  Judgement:  Fair  Insight:  Fair  Psychomotor Activity:  Normal  Concentration:  Fair  Recall:  Fair  Akathisia:  No  AIMS (if indicated):     Assets:  Communication Skills Desire for Improvement  Sleep:  Number of Hours: 5.75     ROS: Neuro: lightheaded in the AM otherwise denies any ataxia, weakness, or headache  MS:  Some leg pain and edema persists, no aches, soreness, or weakness.  Requip is helping the restless leg at night.  GI: no N/V/D/cramps/constipation  Vital Signs:Blood pressure 132/84, pulse 88, temperature 97.4 F (36.3 C), temperature source Oral, resp. rate 12, last menstrual period 06/29/2011, unknown if currently breastfeeding. Current Medications: Current Facility-Administered Medications  Medication Dose Route Frequency Provider Last Rate Last Dose  . acetaminophen (TYLENOL) tablet 650 mg  650 mg Oral Q6H PRN Verne Spurr, PA-C   650 mg at 08/06/11 0651  . alum & mag hydroxide-simeth (MAALOX/MYLANTA) 200-200-20 MG/5ML suspension 30 mL  30 mL Oral Q4H PRN Verne Spurr, PA-C   30 mL at 08/07/11 1058  . buPROPion (WELLBUTRIN XL) 24 hr tablet 300 mg  300 mg Oral Daily Mike Craze, MD   300 mg at 08/09/11 1478  . busPIRone  (BUSPAR) tablet 10 mg  10 mg Oral BID Verne Spurr, PA-C   10 mg at 08/09/11 2956  . furosemide (LASIX) tablet 20 mg  20 mg Oral Daily Sanjuana Kava, NP   20 mg at 08/09/11 2130  . LORazepam (ATIVAN) tablet 0.5 mg  0.5 mg Oral Q8H PRN Sanjuana Kava, NP   0.5 mg at 08/08/11 0903  . magnesium hydroxide (MILK OF MAGNESIA) suspension 30 mL  30 mL Oral Daily PRN Verne Spurr, PA-C      . ondansetron [facility]) tablet 4 mg  4 mg Oral Q8H PRN Verne Spurr, PA-C      . prazosin (MINIPRESS) capsule 1 mg  1 mg Oral QHS PRN Mike Craze, MD      . prazosin (MINIPRESS) capsule 2 mg  2 mg Oral QHS Mike Craze, MD   2 mg at 08/08/11 2155  . rOPINIRole (REQUIP) tablet 0.25 mg  0.25 mg Oral QHS Mike Craze, MD   0.25 mg at 08/08/11 2156   Followed by  . rOPINIRole (REQUIP) tablet 0.5 mg  0.5 mg Oral QHS Mike Craze, MD      . vitamin E capsule 400 Units  400 Units Oral Daily Mike Craze, MD   400 Units at 08/09/11 [phone]  . ziprasidone (GEODON) capsule 60 mg  60 mg Oral BID WC Mike Craze, MD   60 mg at 08/09/11 8469    Lab Results:  Results for orders placed  during the hospital encounter of 08/02/11 (from the past 48 hour(s))  HCG, QUANTITATIVE, PREGNANCY     Status: Abnormal   Collection Time   08/08/11  6:21 AM      Component Value Range Comment   hCG, Beta Chain, Quant, S 37 (*) <5 (mIU/mL)     Physical Findings: AIMS:  , ,  ,  ,    CIWA:    COWS:     Treatment Plan Summary: Daily contact with patient to assess and evaluate symptoms and progress in treatment Medication management  Plan: Continue to monitor her mood with the increase in the Wellbutrin for her depression. She is trying to talk more in groups.  She is journaling some about her feelings.  Estelene Carmack 08/09/2011, 4:54 PM
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
## 2011-08-09 NOTE — Progress Notes (Signed)
08/09/2011  Nursing Heather [name] is the same..sad, flat, depressed. She speaks and moves with  Psychomotor retardation. She is compliant with her meds and she has attended her groups. A She is more sad today, learning her pregnanvy Hcg levels are declining. R Safety is maintaiend. Therapeutic relationship fostered and pt given positive feedback PD RN [facility]

## 2011-08-09 NOTE — Progress Notes (Signed)
Patient seen during d/c planning group.  She advised of continued depression, hopelessness and helplessness rated at five and anxiety rated at seven.  She is denying SI/HI.  Patient advised she has been having problems with anger issues and feeling that her life has little to no purpose.

## 2011-08-10 DIAGNOSIS — G4701 Insomnia due to medical condition: Secondary | ICD-10-CM

## 2011-08-10 NOTE — Progress Notes (Signed)
Patient ID: Heather [name], female   DOB: 1976/03/01, 36 y.o.   MRN: [id number] The patient is sad, but she is able to smile and maintain good eye contact when she is interacting with others. Attended and participated in the evening wrap up group. Was able to share two positive self attributes, "I am a kind person and I am a survivor". She was very proud and smiled as she identified these things about herself. Spoke to her boyfriend on the phone tonight. Afterwards stated that the phone call added to her having a good evening. She laughed about trying to get her homework done to give to her nurse tomorrow.

## 2011-08-10 NOTE — Progress Notes (Signed)
[facility] Group Notes:  (Counselor/Nursing/MHT/Case Management/Adjunct)  08/10/2011 0830  Type of Therapy:  Discharge Planning  Summary of Progress/Problems: Pt seems happier than last weekend. Pt was smiling and pleasant today. Pt states that she feels like she is ready to D/C. Pt states that she will follow up with [facility] and would like to find grief/loss groups and therapy. Pt denies SI/HI.   [name], [name] 08/10/2011, 2:31 PM

## 2011-08-10 NOTE — Progress Notes (Signed)
[facility] Group Notes:  (Counselor/Nursing/MHT/Case Management/Adjunct)  08/10/2011 1315  Type of Therapy:  Group Therapy  Participation Level:  Active  Participation Quality:  Appropriate  Affect:  Appropriate  Cognitive:  Appropriate  Insight:  Good  Engagement in Group:  Good  Engagement in Therapy:  Good  Modes of Intervention:  Activity, Clarification, Problem-solving, Socialization and Support  Summary of Progress/Problems: Pt attended group on self-sabotage and enabling behaviors. Pt were asked to describe self-sabotage and get into groups of two too review the "7 signs of self-sabotage" handout". Pt shared that she has increased depression in the winter months.    [name], [name] 08/10/2011, 5:28 PM

## 2011-08-10 NOTE — Progress Notes (Signed)
  Heather [name] is a 36 y.o. female [id number] 08/22/1975  08/02/2011 Principal Problem:  *Schizoaffective disorder, bipolar type Active Problems:  Adjustment disorder with depressed mood  Insomnia due to mental disorder  Restless leg syndrome   Mental Status: Mood is good denies SI/HI/AVH. Feels ready for discharge.   Subjective/Objective: Feels ready to go will go back to [facility]. Has follow up scheduled but will be discharged  Early next week.    Filed Vitals:   08/10/11 0822  BP: 149/86  Pulse: 93  Temp:   Resp:     Lab Results:   BMET    Component Value Date/Time   NA 137 08/06/2011 1905   K 4.1 08/06/2011 1905   CL 101 08/06/2011 1905   CO2 26 08/06/2011 1905   GLUCOSE 110* 08/06/2011 1905   BUN 12 08/06/2011 1905   CREATININE 0.71 08/06/2011 1905   CALCIUM 9.5 08/06/2011 1905   GFRNONAA >90 08/06/2011 1905   GFRAA >90 08/06/2011 1905    Medications:  Scheduled:     . buPROPion  300 mg Oral Daily  . busPIRone  10 mg Oral BID  . furosemide  20 mg Oral Daily  . prazosin  2 mg Oral QHS  . rOPINIRole  0.25 mg Oral QHS   Followed by  . rOPINIRole  0.5 mg Oral QHS  . vitamin E  400 Units Oral Daily  . ziprasidone  60 mg Oral BID WC     PRN Meds acetaminophen, alum & mag hydroxide-simeth, LORazepam, magnesium hydroxide, ondansetron, prazosin  Plan: no med changes indicated and discharge plans are progressing.  Nadja Lina,[name] 08/10/2011

## 2011-08-10 NOTE — Progress Notes (Signed)
Writer spoke with patient in her room and patient reported that she has had a good day, her boyfriend and mother visited today. Patient reports her anniversary is Monday and they have been together 7 yrs. Patient currently denies having pain, -si/hi/a/v hall. Patient was supported and encouraged, safety maintained on unit, will continue to monitor.

## 2011-08-10 NOTE — Progress Notes (Signed)
08/10/2011 Heather [name] is OOB UAL on the 500 hall today ...status quo. She demonstrates psychomotor retardation , she has a flat, blunt affect. She makes good eye contact. She attends her groups, is engaged in her POC and  Participates in the group discussion. A She denies SI, she rates her depression and hopelessness " 3 / 3 " and states her DC plan is to " take meds and to go ot her support group". R Safety is maintaiend and POC includes fostering therapeutic relationship already establsiehd PD RN [facility]

## 2011-08-11 NOTE — Progress Notes (Signed)
  Heather [name] is a 36 y.o. female [id number] Nov 15, 1975  08/02/2011 Principal Problem:  *Schizoaffective disorder, bipolar type Active Problems:  Adjustment disorder with depressed mood  Insomnia due to mental disorder  Restless leg syndrome   Mental Status:  Mood is calm affect has a normal range denies SI/HI/AVH.  Subjective/Objective: Feels good ready for discharge. Says her mom can come tomorrow after 5pm. Denies a need for med changes /adjustments.   Filed Vitals:   08/11/11 0658  BP: 130/76  Pulse: 104  Temp:   Resp:     Lab Results:   BMET    Component Value Date/Time   NA 137 08/06/2011 1905   K 4.1 08/06/2011 1905   CL 101 08/06/2011 1905   CO2 26 08/06/2011 1905   GLUCOSE 110* 08/06/2011 1905   BUN 12 08/06/2011 1905   CREATININE 0.71 08/06/2011 1905   CALCIUM 9.5 08/06/2011 1905   GFRNONAA >90 08/06/2011 1905   GFRAA >90 08/06/2011 1905    Medications:  Scheduled:     . buPROPion  300 mg Oral Daily  . busPIRone  10 mg Oral BID  . furosemide  20 mg Oral Daily  . prazosin  2 mg Oral QHS  . rOPINIRole  0.5 mg Oral QHS  . vitamin E  400 Units Oral Daily  . ziprasidone  60 mg Oral BID WC     PRN Meds acetaminophen, alum & mag hydroxide-simeth, LORazepam, magnesium hydroxide, ondansetron, prazosin Plan Continue current plan of care.   Heather [name],[name] 08/11/2011

## 2011-08-11 NOTE — Progress Notes (Signed)
Writer spoke with patient concerning how her day has been and she imformed me that she is being discharged on tomorrow and has written down in her journal things that she is going to be working on and would like for me to read it also. Patient reports she is a little anxious about discharge and request a prn of Ativan with her hs medications. Patient was supported and encouraged, currently denies pain, -si/hi/a/v hall. Safety maintained on unit, will continue to monitor.

## 2011-08-11 NOTE — Progress Notes (Signed)
08/11/2011 [name] cont to try to process and understand her illness as well as how to learn healthier coping mechanisms. She makes good eye contact. She shares her feelings and her journal with this nurse...excited that she is learning to be more positive and practice healthier ways to get her needs met, while still in the hospital.  A She denies SI, completes her self assessment this AM and on it she rated her depression and hopelessness " 2 / 2 ". R Safety is maintained and POC includes continuing to foster therapeutic relationship  Until all goals are met and / or pt DC'd.. PD RN [facility]

## 2011-08-11 NOTE — Progress Notes (Signed)
[facility] Group Notes:  (Counselor/Nursing/MHT/Case Management/Adjunct)  08/11/2011 1315  Type of Therapy:  Group Therapy  Participation Level:  Active  Participation Quality:  Appropriate  Affect:  Appropriate  Cognitive:  Appropriate  Insight:  Good  Engagement in Group:  Good  Engagement in Therapy:  Good  Modes of Intervention:  Clarification, Problem-solving, Socialization and Support  Summary of Progress/Problems: Pt attended and participated in group on supports. Pt's were asked what they think support means and how they seek support outside of the hospital.   [name], [name] 08/11/2011, 3:55 PM 

## 2011-08-11 NOTE — Progress Notes (Signed)
[facility] Group Notes:  (Counselor/Nursing/MHT/Case Management/Adjunct)  08/11/2011 0830  Type of Therapy:  Discharge Planning  Summary of Progress/Problems: Pt attended aftercare planning group. Pt stated that she is ready to D/C the upcoming week. Pt denies SI/HI. Pt stated that after D/C she will take up some swimming classes to started her weight loss goal.   [name], [name] 08/11/2011, 3:17 PM

## 2011-08-12 MED ORDER — ROPINIROLE HCL 0.5 MG PO TABS: 0.5000 mg | ORAL_TABLET | Freq: Every day | ORAL | Status: DC

## 2011-08-12 MED ORDER — BUPROPION HCL ER (XL) 300 MG PO TB24: 300.0000 mg | ORAL_TABLET | Freq: Every day | ORAL | Status: DC

## 2011-08-12 MED ORDER — FUROSEMIDE 20 MG PO TABS: 20.0000 mg | ORAL_TABLET | Freq: Every day | ORAL | Status: DC

## 2011-08-12 MED ORDER — MULTIVITAMINS PO CAPS: 1.0000 | ORAL_CAPSULE | Freq: Every day | ORAL | Status: AC

## 2011-08-12 MED ORDER — ZIPRASIDONE HCL 60 MG PO CAPS: 60.0000 mg | ORAL_CAPSULE | Freq: Two times a day (BID) | ORAL | Status: DC

## 2011-08-12 MED ORDER — ADULT MULTIVITAMIN W/MINERALS CH
1.0000 | ORAL_TABLET | Freq: Every day | ORAL | Status: DC
Start: 2011-08-12 — End: 2011-08-12
  Filled 2011-08-12 (×2): qty 14

## 2011-08-12 MED ORDER — PRAZOSIN HCL 2 MG PO CAPS: 2.0000 mg | ORAL_CAPSULE | Freq: Every day | ORAL | Status: DC

## 2011-08-12 MED ORDER — VITAMIN E 180 MG (400 UNIT) PO CAPS: 400.0000 [IU] | ORAL_CAPSULE | Freq: Every day | ORAL | Status: DC

## 2011-08-12 MED ORDER — POTASSIUM CHLORIDE CRYS ER 10 MEQ PO TBCR
10.0000 meq | EXTENDED_RELEASE_TABLET | Freq: Two times a day (BID) | ORAL | Status: DC
  Filled 2011-08-12: qty 28

## 2011-08-12 MED ORDER — POTASSIUM CHLORIDE ER 10 MEQ PO TBCR: 10.0000 meq | EXTENDED_RELEASE_TABLET | Freq: Two times a day (BID) | ORAL | Status: DC
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
## 2011-08-12 NOTE — Progress Notes (Signed)
08/12/2011 0945 Heather [name] is up first thing this morning...she says to oncoming staff that " I am ready to go home". She denies  SI, she completed her self inventory and on it she wrote she rated her depression and hopelessness " 12 ?/ 1 ", respectively and states she has gained " much" from her stay here. He er affect is bight. She jokes with staff nervously and says when she leaves, she will take with her the " coping mechanisms" she has learned in her groups this hospitalization.A She is emdciated per MD order. R Safety is maintained and POC incldues continuing with DC planning PD RN [facility]

## 2011-08-12 NOTE — Discharge Summary (Signed)
Physician Discharge Summary Note  Patient:  Heather [name] is an 36 y.o., female MRN:  [id number] DOB:  05-Feb-1976 Patient phone:  [phone] (home)  Patient address:   [address] [place] [address],   Date of Admission:  08/02/2011  Date of Discharge: 08/12/11  Reason for Admission: Suicidal ideations  Discharge Diagnoses: Principal Problem:  *Schizoaffective disorder, bipolar type Active Problems:  Adjustment disorder with depressed mood  Insomnia due to mental disorder  Restless leg syndrome   Axis Diagnosis:   AXIS I:  Adjustment Disorder with Depressed Mood and Schizoaffective Disorder AXIS II:  Deferred AXIS III:   Past Medical History  Diagnosis Date  . Amenorrhea   . Bipolar 1 disorder   . Dysmenorrhea   . COPD (chronic obstructive pulmonary disease)   . Dysrhythmia     DR Heather [name]    . Asthma   . Sleep apnea     CPAP  . Shortness of breath     WITH EXERTION   . GERD (gastroesophageal reflux disease)     HEARTBURN   TUMS  . Neuromuscular disorder     RESTLESS LEG   . Depression    AXIS IV:  other psychosocial or environmental problems AXIS V:  67  Level of Care:  OP  Hospital Course:  This is a 36 year old Caucasian female, admitted to [facility] from the [facility] with complaints of suicidal ideations. Patient reports, "I am here mainly for my depression. It has worsened since for the 8 weeks. I recently found out that I was pregnant. I was very happy because I was told that I will never be able to conceive a child. I was looking forward to being a mother. Soon enough, I had a doctor's visit, my blood was checked and I was told the that something was not right. When verified further, I was told that it was an ectopic pregnancy. Then I was given methotrexate injunction to dissolve my baby and it was gone. I am devastated since I had the miscarriage.   While a patient in this hospital, patient received medication management for her mood and  adjustment disorder problems. She was ordered and given Wellbutrin XL 300 mg daily, Buspar 10 mg twice daily and Geodon 60 mg daily with meals daily. She was also enrolled in group counseling and activities, in which she participated actively on daily basis. Patient also received medication management and monitoring for her other medical conditions.  She tolerated her treatment regimen without any significant adverse effects and or reactions. During the initial phase of her admission, patient was endorsing not ready for a discharge on daily basis. She was also noted to be tearing up whenever she talked about the pregnancy that she lost.   As her stay to this hospital progressed, Patient was noted with improved mood, appearance and  appropriate affects. She expressed how well she is feeling being on Geodon. Patient's HCG, serum qualitative was also checked on weekly basis per her OBGYN Dr. Rayna [name] recommendations. He advised to continue weekly checks on the HCG srum qualitaive until the level is less than 2.  Patient's current HCG level at this point is 37. The next HCG blood work will be done at the [facility] clinic by Dr. Emelda [name]. Patient was also treated for lower extremity swellings with Furosemide 20 mg daily. She is instructed to follow-up care for all her medical conditions with her primary care physician.  Patient attended treatment team meeting this am. She  acknowledged that her mood has in proved. She added that she is willing to accept what she cannot change. She expressed her positive future hope to one day have a baby as her OBGYN md informed her that she may be able to have a baby in the future after her body finally healed from this current miscarriage. She agreed with the treatment team that she is stable and ready for discharge. She will continue psychiatric care on outpatient basis at the [facility] recovery on 08/14/11 and with Dr. Lovette [name] at [facility] at 2:00 pm. She is aware that the [facility]  recovery appointment is a walk-in appointment between the hours of 07:45 am and 11:00 am. She is being discharged to her home that she shares with her fiance. She adamantly denies suicidal, homicidal ideations, auditory, visual hallucinations and or delusional thinking. She left [facility] with all personal belongings in no apparent distress via family transport.   Consults:  None  Significant Diagnostic Studies:  HCG serum, qualitative  Discharge Vitals:   Blood pressure 147/82, pulse 96, temperature 97.5 F (36.4 C), temperature source Oral, resp. rate 16, last menstrual period 06/29/2011, unknown if currently breastfeeding.  Mental Status Exam: See Mental Status Examination and Suicide Risk Assessment completed by Attending Physician prior to discharge.  Discharge destination:  Home  Is patient on multiple antipsychotic therapies at discharge:  No   Has Patient had three or more failed trials of antipsychotic monotherapy by history:  No  Recommended Plan for Multiple Antipsychotic Therapies: NA   Medication List  As of 08/12/2011  2:04 PM   STOP taking these medications         ARIPiprazole 5 MG tablet      escitalopram 20 MG tablet      Melatonin 5 MG Tabs      Prenatal Complete 14-0.4 MG Tabs         TAKE these medications      Indication    buPROPion 300 MG 24 hr tablet   Commonly known as: WELLBUTRIN XL   Take 1 tablet (300 mg total) by mouth daily. For depression.       busPIRone 10 MG tablet   Commonly known as: BUSPAR   Take 10 mg by mouth 2 (two) times daily. For anxiety       furosemide 20 MG tablet   Commonly known as: LASIX   Take 1 tablet (20 mg total) by mouth daily. For leg edema, must take with potassium and must follow -up with primary provider to evaluate the needed replacement of potassium,    Indication: Edema      multivitamin capsule   Take 1 capsule by mouth daily. For nutritional supplementation.       potassium chloride 10 MEQ tablet   Commonly  known as: K-DUR   Take 1 tablet (10 mEq total) by mouth 2 (two) times daily. For potassium replacement (Lasix causes potassium loss)       prazosin 2 MG capsule   Commonly known as: MINIPRESS   Take 1 capsule (2 mg total) by mouth at bedtime. For insomina       rOPINIRole 0.5 MG tablet   Commonly known as: REQUIP   Take 1 tablet (0.5 mg total) by mouth at bedtime. For restless leg syndrome       vitamin E 400 UNIT capsule   Take 1 capsule (400 Units total) by mouth daily. For side effects from the Geodon       ziprasidone 60 MG  capsule   Commonly known as: GEODON   Take 1 capsule (60 mg total) by mouth 2 (two) times daily with a meal. For schizoaffective symptoms of mood and hallucinations.            Follow-up Information    Follow up with Dr. Lovette [name] - [facility] on 10/07/2011. (You are scheduled with Dr. Vilinda Blanks on  Monday, October 07, 2011 at 2:00 p.m.  Will call you with an earlier appointment if there is a cancellation)       Follow up with [facility] on 08/14/2011. (You  are to be seen at [facility] walk-in clinic on Wednesday, Aug 14, 2011 ,   Please arrive between 7:45 a.m.-11:00: a.m.)    Contact information:   [address] [place], [place]  [address]  [phone]         Follow-up recommendations:  Activity:  as tolerated. Other:  Keep all scheduled follow-up appointments as recommended.  Comments:  Take all your medications as prescribed. Report any adverse effects from medication to your outpatient provider promptly.  SignedArmandina Stammer I 08/12/2011, 2:04 PM

## 2011-08-12 NOTE — Progress Notes (Signed)
[facility] MD Progress Note  08/12/2011 1:15 PM  Diagnosis:  Axis I: Adjustment Disorder with Depressed Mood, Schizoaffective Disorder and Insomnia due to mental disorder  ADL's:  Intact  Sleep: Good  Appetite:  Good  Suicidal Ideation:  Pt denied any suicidal thoughts today. Homicidal Ideation:  Denies adamantly any homicidal thoughts.  Mental Status Examination/Evaluation: Objective:  Appearance: Casual  Eye Contact::  Good  Speech:  Clear and Coherent  Volume:  Normal  Mood:  Euthymic  Affect:  Congruent  Thought Process:  Coherent  Orientation:  Full  Thought Content:  WDL  Suicidal Thoughts:  No  Homicidal Thoughts:  No  Memory:  Immediate;   Good  Judgement:  Good  Insight:  Good  Psychomotor Activity:  Normal  Concentration:  Good  Recall:  Good  Akathisia:  No  AIMS (if indicated):     Assets:  Communication Skills Desire for Improvement  Sleep:  Number of Hours: 6.75     ROS: Neuro: denies ataxia, weakness, or headache  MS:  denies aches, soreness, or weakness.  Requip is helping the restless leg at night.  GI: denies N/V/D/cramps/constipation  Vital Signs:Blood pressure 147/82, pulse 96, temperature 97.5 F (36.4 C), temperature source Oral, resp. rate 16, last menstrual period 06/29/2011, unknown if currently breastfeeding. Current Medications: Current Facility-Administered Medications  Medication Dose Route Frequency Provider Last Rate Last Dose  . acetaminophen (TYLENOL) tablet 650 mg  650 mg Oral Q6H PRN Verne Spurr, PA-C   650 mg at 08/06/11 0651  . alum & mag hydroxide-simeth (MAALOX/MYLANTA) 200-200-20 MG/5ML suspension 30 mL  30 mL Oral Q4H PRN Verne Spurr, PA-C   30 mL at 08/07/11 1058  . buPROPion (WELLBUTRIN XL) 24 hr tablet 300 mg  300 mg Oral Daily Mike Craze, MD   300 mg at 08/12/11 0813  . busPIRone (BUSPAR) tablet 10 mg  10 mg Oral BID Verne Spurr, PA-C   10 mg at 08/12/11 0813  . furosemide (LASIX) tablet 20 mg  20 mg Oral Daily  Sanjuana Kava, NP   20 mg at 08/12/11 0813  . LORazepam (ATIVAN) tablet 0.5 mg  0.5 mg Oral Q8H PRN Sanjuana Kava, NP   0.5 mg at 08/11/11 2149  . magnesium hydroxide (MILK OF MAGNESIA) suspension 30 mL  30 mL Oral Daily PRN Verne Spurr, PA-C      . ondansetron [facility]) tablet 4 mg  4 mg Oral Q8H PRN Verne Spurr, PA-C      . prazosin (MINIPRESS) capsule 1 mg  1 mg Oral QHS PRN Mike Craze, MD      . prazosin (MINIPRESS) capsule 2 mg  2 mg Oral QHS Mike Craze, MD   2 mg at 08/11/11 2148  . rOPINIRole (REQUIP) tablet 0.5 mg  0.5 mg Oral QHS Mike Craze, MD   0.5 mg at 08/11/11 2148  . vitamin E capsule 400 Units  400 Units Oral Daily Mike Craze, MD   400 Units at 08/12/11 0813  . ziprasidone (GEODON) capsule 60 mg  60 mg Oral BID WC Mike Craze, MD   60 mg at 08/12/11 0813    Lab Results:  No results found for this or any previous visit (from the past 48 hour(s)).  Physical Findings: AIMS:  , ,  ,  ,    CIWA:    COWS:     Treatment Plan Summary: Daily contact with patient to assess and evaluate symptoms and progress in treatment  Medication management  Plan: D/C, much improved.  Heather [name] 08/12/2011, 1:15 PM

## 2011-08-12 NOTE — Progress Notes (Addendum)
[facility] Group Notes: (Counselor/Nursing/MHT/Case Management/Adjunct) 08/12/2011   @  11:00am Overcoming Obstacles to Wellness   Type of Therapy:  Group Therapy  Participation Level:  Active  Participation Quality:  Attentive, Sharing, Appropriate, Supportive   Affect:  Appropriate  Cognitive:  Appropriate  Insight:  Good  Engagement in Group: Good  Engagement in Therapy:  Good  Modes of Intervention:  Support and Exploration  Summary of Progress/Problems: Heather [name] shared that when she came to the hospital she thought she could never get through the darkness that she was experiencing and that she would never be well again. She explored how coming to groups and talking through her thoughts and feelings, even when she was afraid or embarrassed to talk about them, has given her new hope. She stated that she will go home today with a strength she had not realized was within her and encouraged new patients to take advantage of the groups and professionals at the hospital and to really put effort into the program so that it will work for them. Heather [name] was supportive and appropriate with other group members who shared the dark places they are currently in.  [name] 08/12/2011 3:44 PM    [facility] Group Notes: (Counselor/Nursing/MHT/Case Management/Adjunct) 08/12/2011   @1 :15pm Breathing & Meditation for Anxiety/Anger   Type of Therapy:  Group Therapy  Participation Level:  Active  Participation Quality:  Attentive, Sharing, Appropriate   Affect:  Appropriate  Cognitive:  Appropriate  Insight:  Good  Engagement in Group:  Good  Engagement in Therapy: Good    Modes of Intervention:  Support and Exploration  Summary of Progress/Problems: Heather [name] explored how the meditation exercise was helpful to her and stated she was grateful to be given her own copy of the cd to take home. She reported that she plans to use the circle breathing technique as a tool when she becomes  stressed or anxious.  [name] 08/12/2011 3:50 PM

## 2011-08-12 NOTE — Progress Notes (Signed)
[facility] Case Management Discharge Plan:  Will you be returning to the same living situation after discharge: Yes,  Patient will return to her home At discharge, do you have transportation home?:Yes,  Patient's reports her mother will transport her home Do you have the ability to pay for your medications:Yes,  Patient has Medicare and Medicaid  Interagency Information:     Release of information consent forms completed and in the chart;  Patient's signature needed at discharge.  Patient to Follow up at:  Follow-up Information    Follow up with Dr. Lovette Cliche - [facility] on 10/07/2011. (You are scheduled with Dr. Vilinda Blanks on  Monday, October 07, 2011 at 2:00 p.m.  Will call you with an earlier appointment if there is a cancellation)       Follow up with [facility] on 08/12/2011. (You  are to be seen at [facility] walk-in clinic on Monday, Aug 12, 2011 ,   Please arrive between 7:45 a.m.-11:00: a.m.)    Contact information:   [address] [place], [place]  [address]  [phone]         Patient denies SI/HI:   Yes,  Patient is no longer endorsing SI    Safety Planning and Suicide Prevention discussed:  Yes,  Reivewed during discharge planning group  Barrier to discharge identified:No.  Summary and Recommendations:  Patient encouraged to follow up with outpatient providers   Wynn Banker 08/12/2011, 10:23 AM

## 2011-08-12 NOTE — Discharge Instructions (Signed)
Be sure and follow up with your doctor about the edema, Lasix, and dose of potassium that would be appropriate.  BE SURE and follow up very soon about this.

## 2011-08-12 NOTE — Progress Notes (Signed)
Discharge instructions reviewed with pt who verbalizes understanding. Consents signed, Rx given and samples given. Pt denies SI, smiles easily and states that she feels much better. Departs unit in safe and stable condition.

## 2011-08-12 NOTE — Tx Team (Signed)
Interdisciplinary Treatment Plan Update (Adult)  Date:  08/12/2011  Time Reviewed:  10:14 AM   Progress in Treatment: Attending groups:   Yes   Participating in groups:  Yes Taking medication as prescribed:  Yes Tolerating medication:  Yes Family/Significant othe contact made: Contact made with family Patient understands diagnosis:  Yes Discussing patient identified problems/goals with staff: Yes Medical problems stabilized or resolved: Yes Denies suicidal/homicidal ideation:Yes Issues/concerns per patient self-inventory:  Other:  New problem(s) identified:  Reason for Continuation of Hospitalization:  Interventions implemented related to continuation of hospitalization:  Additional comments:  Estimated length of stay:  Discharge today  Discharge Plan:  Home with outpatient providers  New goal(s):  Review of initial/current patient goals per problem list:   1.  Goal(s):  Eliminate SI/Thoughts of self harm  Met:  Yes  Target date: d/c  As evidenced by:  Patient no longer endorsing SI or other thoughts of self harm  2.  Goal (s):  Reduce depression/anxiety (rated at ten on admission)  Met:  Yes  Target date: d/c   As evidenced by:  Patient will rate symptoms at three or below at discharge- currently rating depression/anxiety at three and one today.  3.  Goal(s):  Stabilize on medications  Met:  Yes  Target date: d/c  As evidenced by:  Patient is reporting being stable on medications  4.  Goal(s):  Schedule outpatient follow up  Met:  Yes  Target date: d/c  As evidenced by:  Follow up appointments are scheduled  Attendees: Patient:  Heather [name] 08/12/2011  10:16 AM  Family:     Physician:  Orson Aloe, MD 08/12/2011 10:14 AM   Nursing:   Barrie Folk, RN 08/12/2011 10:14 AM   CaseManager:  Juline Patch, LCSW 08/12/2011 10:14 AM   Counselor:  Angus Palms, LCSW 08/12/2011 10:14 AM   Other:  Consuello Bossier, NP 08/12/2011 10:14 AM   Other:  Reyes Ivan,  LCSWA 08/12/2011  10:14 AM   Other:  Berneice Heinrich, RN 08/12/2011  10:16 AM  Other:      Scribe for Treatment Team:   Wynn Banker, LCSW,  08/12/2011 10:14 AM

## 2011-08-13 NOTE — Discharge Summary (Signed)
I agree with this D/C Summary.  

## 2011-08-15 NOTE — Progress Notes (Signed)
Patient Discharge Instructions:  After Visit Summary (AVS):   Faxed to:  08/14/2011 Face Sheet:   Faxed to:  08/14/2011 Psychiatric Admission Assessment Note:   Faxed to:  08/14/2011 Suicide Risk Assessment - Discharge Assessment:   Faxed to:  08/14/2011 Faxed/Sent to the Next Level Care provider:  08/14/2011  Faxed to [facility] - Dr. Lovette Cliche @ [phone] And to [facility] @ [phone]  Heloise Purpura, Eduard Clos, 08/15/2011, 6:48 PM

## 2011-08-21 ENCOUNTER — Emergency Department (HOSPITAL_COMMUNITY)
Admission: EM | Admit: 2011-08-21 | Discharge: 2011-08-21 | Disposition: A | Discharge status: Home / Self Care | Payer: Medicare Other | Attending: Emergency Medicine | Admitting: Emergency Medicine

## 2011-08-21 ENCOUNTER — Encounter (HOSPITAL_COMMUNITY): Payer: Self-pay | Admitting: *Deleted

## 2011-08-21 DIAGNOSIS — J4489 Other specified chronic obstructive pulmonary disease: Secondary | ICD-10-CM | POA: Insufficient documentation

## 2011-08-21 DIAGNOSIS — J449 Chronic obstructive pulmonary disease, unspecified: Secondary | ICD-10-CM | POA: Insufficient documentation

## 2011-08-21 DIAGNOSIS — H81399 Other peripheral vertigo, unspecified ear: Secondary | ICD-10-CM | POA: Insufficient documentation

## 2011-08-21 DIAGNOSIS — Z79899 Other long term (current) drug therapy: Secondary | ICD-10-CM | POA: Insufficient documentation

## 2011-08-21 DIAGNOSIS — F319 Bipolar disorder, unspecified: Secondary | ICD-10-CM | POA: Insufficient documentation

## 2011-08-21 LAB — DIFFERENTIAL
Basophils Absolute: 0 10*3/uL (ref 0.0–0.1)
Basophils Relative: 0 % (ref 0–1)
Eosinophils Absolute: 0.5 10*3/uL (ref 0.0–0.7)
Monocytes Relative: 5 % (ref 3–12)
Neutrophils Relative %: 66 % (ref 43–77)

## 2011-08-21 LAB — CBC
Hemoglobin: 11.6 g/dL — ABNORMAL LOW (ref 12.0–15.0)
MCH: 27.2 pg (ref 26.0–34.0)
MCHC: 31.7 g/dL (ref 30.0–36.0)
RDW: 15.6 % — ABNORMAL HIGH (ref 11.5–15.5)

## 2011-08-21 LAB — BASIC METABOLIC PANEL
BUN: 13 mg/dL (ref 6–23)
Creatinine, Ser: 0.63 mg/dL (ref 0.50–1.10)
GFR calc Af Amer: >90 mL/min (ref >90)
GFR calc non Af Amer: >90 mL/min (ref >90)
Potassium: 3.6 mEq/L (ref 3.5–5.1)

## 2011-08-21 MED ORDER — MECLIZINE HCL 12.5 MG PO TABS
25.0000 mg | ORAL_TABLET | Freq: Once | ORAL | Status: AC
  Administered 2011-08-21: 25 mg via ORAL
  Filled 2011-08-21: qty 2

## 2011-08-21 MED ORDER — MECLIZINE HCL 25 MG PO TABS: 25.0000 mg | ORAL_TABLET | Freq: Three times a day (TID) | ORAL | Status: DC | PRN

## 2011-08-21 NOTE — ED Notes (Addendum)
Pt c/o dizziness and chills since yesterday. Also c/o diarrhea and lower back pain today. Denies urinary symptoms. States that she had a shot for tubal pregnancy last week here. States that dizziness is worse with position change.

## 2011-08-21 NOTE — Discharge Instructions (Signed)
Vertigo Vertigo means you feel like you or your surroundings are moving when they are not. Vertigo can be dangerous if it occurs when you are at work, driving, or performing difficult activities.  CAUSES  Vertigo occurs when there is a conflict of signals sent to your brain from the visual and sensory systems in your body. There are many different causes of vertigo, including:  Infections, especially in the inner ear.   A bad reaction to a drug or misuse of alcohol and medicines.   Withdrawal from drugs or alcohol.   Rapidly changing positions, such as lying down or rolling over in bed.   A migraine headache.   Decreased blood flow to the brain.   Increased pressure in the brain from a head injury, infection, tumor, or bleeding.  SYMPTOMS  You may feel as though the world is spinning around or you are falling to the ground. Because your balance is upset, vertigo can cause nausea and vomiting. You may have involuntary eye movements (nystagmus). DIAGNOSIS  Vertigo is usually diagnosed by physical exam. If the cause of your vertigo is unknown, your caregiver may perform imaging tests, such as an MRI scan (magnetic resonance imaging). TREATMENT  Most cases of vertigo resolve on their own, without treatment. Depending on the cause, your caregiver may prescribe certain medicines. If your vertigo is related to body position issues, your caregiver may recommend movements or procedures to correct the problem. In rare cases, if your vertigo is caused by certain inner ear problems, you may need surgery. HOME CARE INSTRUCTIONS   Follow your caregiver's instructions.   Avoid driving.   Avoid operating heavy machinery.   Avoid performing any tasks that would be dangerous to you or others during a vertigo episode.   Tell your caregiver if you notice that certain medicines seem to be causing your vertigo. Some of the medicines used to treat vertigo episodes can actually make them worse in some  people.  SEEK IMMEDIATE MEDICAL CARE IF:   Your medicines do not relieve your vertigo or are making it worse.   You develop problems with talking, walking, weakness, or using your arms, hands, or legs.   You develop severe headaches.   Your nausea or vomiting continues or gets worse.   You develop visual changes.   A family member notices behavioral changes.   Your condition gets worse.  MAKE SURE YOU:  Understand these instructions.   Will watch your condition.   Will get help right away if you are not doing well or get worse.  Document Released: 01/02/2005 Document Revised: 03/14/2011 Document Reviewed: 10/11/2010 [employer] Patient Information 2012 [employer].  Meclizine tablets or capsules What is this medicine? MECLIZINE (MEK li zeen) is an antihistamine. It is used to prevent nausea, vomiting, or dizziness caused by motion sickness. It is also used to prevent and treat vertigo (extreme dizziness or a feeling that you or your surroundings are tilting or spinning around). This medicine may be used for other purposes; ask your health care provider or pharmacist if you have questions. What should I tell my health care provider before I take this medicine? They need to know if you have any of these conditions: -asthma -glaucoma -prostate trouble -stomach problems -urinary problems -an unusual or allergic reaction to meclizine, other medicines, foods, dyes, or preservatives -pregnant or trying to get pregnant -breast-feeding How should I use this medicine? Take this medicine by mouth with a glass of water. Follow the directions on the prescription   label. If you are using this medicine to prevent motion sickness, take the dose at least 1 hour before travel. If it upsets your stomach, take it with food or milk. Take your doses at regular intervals. Do not take your medicine more often than directed. Talk to your pediatrician regarding the use of this medicine in children.  Special care may be needed. Overdosage: If you think you have taken too much of this medicine contact a poison control center or emergency room at once. NOTE: This medicine is only for you. Do not share this medicine with others. What if I miss a dose? If you miss a dose, take it as soon as you can. If it is almost time for your next dose, take only that dose. Do not take double or extra doses. What may interact with this medicine? -barbiturate medicines for inducing sleep or treating seizures -digoxin -medicines for anxiety or sleeping problems, like alprazolam, diazepam or temazepam -medicines for hay fever and other allergies -medicines for mental depression -medicines for movement abnormalities as in Parkinson's disease, or for stomach problems -medicines for pain -medicines that relax muscles This list may not describe all possible interactions. Give your health care provider a list of all the medicines, herbs, non-prescription drugs, or dietary supplements you use. Also tell them if you smoke, drink alcohol, or use illegal drugs. Some items may interact with your medicine. What should I watch for while using this medicine? If you are taking this medicine on a regular schedule, visit your doctor or health care professional for regular checks on your progress. You may get dizzy, drowsy or have blurred vision. Do not drive, use machinery, or do anything that needs mental alertness until you know how this medicine affects you. Do not stand or sit up quickly, especially if you are an older patient. This reduces the risk of dizzy or fainting spells. Alcohol can increase possible dizziness. Avoid alcoholic drinks. Your mouth may get dry. Chewing sugarless gum or sucking hard candy, and drinking plenty of water may help. Contact your doctor if the problem does not go away or is severe. This medicine may cause dry eyes and blurred vision. If you wear contact lenses you may feel some discomfort.  Lubricating drops may help. See your eye doctor if the problem does not go away or is severe. What side effects may I notice from receiving this medicine? Side effects that you should report to your doctor or health care professional as soon as possible: -fainting spells -fast or irregular heartbeat Side effects that usually do not require medical attention (report to your doctor or health care professional if they continue or are bothersome): -constipation -difficulty passing urine -difficulty sleeping -headache -stomach upset This list may not describe all possible side effects. Call your doctor for medical advice about side effects. You may report side effects to FDA at 1-800-FDA-1088. Where should I keep my medicine? Keep out of the reach of children. Store at room temperature between 15 and 30 degrees C (59 and 86 degrees F). Keep container tightly closed. Throw away any unused medicine after the expiration date. NOTE: This sheet is a summary. It may not cover all possible information. If you have questions about this medicine, talk to your doctor, pharmacist, or health care provider.  2012, [employer]. (10/01/2007 10:35:36 AM) 

## 2011-08-21 NOTE — ED Provider Notes (Signed)
History   This chart was scribed for Dione Booze, MD scribed by Magnus Sinning. The patient was seen in room APA01/APA01 seen at 17:39    CSN: [id number]  Arrival date & time 08/21/11  1712   First MD Initiated Contact with Patient 08/21/11 1734      Chief Complaint  Patient presents with  . Dizziness    (Consider location/radiation/quality/duration/timing/severity/associated sxs/prior treatment) HPI Heather [name] is a 36 y.o. female who presents to the Emergency Department complaining of constant moderate dizziness, onset yesterday with associated nausea, HA,chills, back pain that radiates into left leg, and diarrhea. Says that when she stands up she "feels like the room is spinning," and that it lasts for a few minutes. Adds that dizziness occasionally occurs when she sits down, but specifies that it's predominately when she initially stands up. Patient rates the pain a 8/10 currently and denies emesis. She explains that 3 weeks ago she had a shot for an ectopic pregnancy. Patient also says she is normally in good health and that she occasional smokes, but does not drink.  PCP: [facility] in [place]; Dr. Creta Levin  Past Medical History  Diagnosis Date  . Amenorrhea   . Bipolar 1 disorder   . Dysmenorrhea   . COPD (chronic obstructive pulmonary disease)   . Dysrhythmia     DR Eden Emms    . Asthma   . Sleep apnea     CPAP  . Shortness of breath     WITH EXERTION   . GERD (gastroesophageal reflux disease)     HEARTBURN   TUMS  . Neuromuscular disorder     RESTLESS LEG   . Depression     Past Surgical History  Procedure Date  . Cholecystectomy   . Tonsillectomy     History reviewed. No pertinent family history.  History  Substance Use Topics  . Smoking status: Passive Smoker    Types: Cigarettes  . Smokeless tobacco: Not on file  . Alcohol Use: No     occasional     OB History    Grav Para Term Preterm Abortions TAB SAB Ect Mult Living   1                Review of Systems  All other systems reviewed and are negative.   10 Systems reviewed and are negative for acute change except as noted in the HPI. Allergies  Nutritional supplements; Penicillins; and Adhesive  Home Medications   Current Outpatient Rx  Name Route Sig Dispense Refill  . BUPROPION HCL ER (XL) 300 MG PO TB24 Oral Take 1 tablet (300 mg total) by mouth daily. For depression. 30 tablet 0  . BUSPIRONE HCL 10 MG PO TABS Oral Take 10 mg by mouth 2 (two) times daily. For anxiety    . FUROSEMIDE 20 MG PO TABS Oral Take 1 tablet (20 mg total) by mouth daily. For leg edema, must take with potassium and must follow -up with primary provider to evaluate the needed replacement of potassium, 30 tablet 0  . MULTIVITAMINS PO CAPS Oral Take 1 capsule by mouth daily. For nutritional supplementation. 30 capsule 0  . POTASSIUM CHLORIDE ER 10 MEQ PO TBCR Oral Take 1 tablet (10 mEq total) by mouth 2 (two) times daily. For potassium replacement (Lasix causes potassium loss) 60 tablet 0  . PRAZOSIN HCL 2 MG PO CAPS Oral Take 1 capsule (2 mg total) by mouth at bedtime. For insomina 30 capsule 0  . ROPINIROLE  HCL 0.5 MG PO TABS Oral Take 1 tablet (0.5 mg total) by mouth at bedtime. For restless leg syndrome 30 tablet 0  . VITAMIN E 400 UNITS PO CAPS Oral Take 1 capsule (400 Units total) by mouth daily. For side effects from the Geodon 30 capsule 0  . ZIPRASIDONE HCL 60 MG PO CAPS Oral Take 1 capsule (60 mg total) by mouth 2 (two) times daily with a meal. For schizoaffective symptoms of mood and hallucinations. 60 capsule 0    BP 140/56  Pulse 97  Temp(Src) 98.1 F (36.7 C) (Oral)  Resp 20  Ht 5\' 3"  (1.6 m)  Wt 472 lb (214.098 kg)  BMI 83.61 kg/m2  SpO2 97%  LMP 06/29/2011  Physical Exam  Nursing note and vitals reviewed. Constitutional: She is oriented to person, place, and time. She appears well-developed and well-nourished. No distress.       obese  HENT:  Head:  Normocephalic and atraumatic.  Eyes: EOM are normal. Pupils are equal, round, and reactive to light.       No nystagmus  Neck: Neck supple. No tracheal deviation present.  Cardiovascular: Normal rate.   Murmur heard.      2/6 holosystolic murmur heard at the base  Pulmonary/Chest: Effort normal. No respiratory distress.  Abdominal: Soft. She exhibits no distension.  Musculoskeletal: Normal range of motion. She exhibits no edema.  Neurological: She is alert and oriented to person, place, and time. No sensory deficit.       Dizziness is reproduced by head movement  Skin: Skin is warm and dry.  Psychiatric: She has a normal mood and affect. Her behavior is normal.    ED Course  Procedures (including critical care time) DIAGNOSTIC STUDIES: Oxygen Saturation is 97% on room air, normal by my interpretation.    COORDINATION OF CARE:  Results for orders placed during the hospital encounter of 08/21/11  CBC      Component Value Range   WBC 13.3 (*) 4.0 - 10.5 (K/uL)   RBC 4.27  3.87 - 5.11 (MIL/uL)   Hemoglobin 11.6 (*) 12.0 - 15.0 (g/dL)   HCT 62.1  30.8 - 65.7 (%)   MCV 85.7  78.0 - 100.0 (fL)   MCH 27.2  26.0 - 34.0 (pg)   MCHC 31.7  30.0 - 36.0 (g/dL)   RDW 84.6 (*) 96.2 - 15.5 (%)   Platelets 416 (*) 150 - 400 (K/uL)  DIFFERENTIAL      Component Value Range   Neutrophils Relative 66  43 - 77 (%)   Neutro Abs 8.6 (*) 1.7 - 7.7 (K/uL)   Lymphocytes Relative 26  12 - 46 (%)   Lymphs Abs 3.5  0.7 - 4.0 (K/uL)   Monocytes Relative 5  3 - 12 (%)   Monocytes Absolute 0.7  0.1 - 1.0 (K/uL)   Eosinophils Relative 3  0 - 5 (%)   Eosinophils Absolute 0.5  0.0 - 0.7 (K/uL)   Basophils Relative 0  0 - 1 (%)   Basophils Absolute 0.0  0.0 - 0.1 (K/uL)  BASIC METABOLIC PANEL      Component Value Range   Sodium 138  135 - 145 (mEq/L)   Potassium 3.6  3.5 - 5.1 (mEq/L)   Chloride 101  96 - 112 (mEq/L)   CO2 28  19 - 32 (mEq/L)   Glucose, Bld 90  70 - 99 (mg/dL)   BUN 13  6 - 23  (mg/dL)   Creatinine, Ser 9.52  0.50 - 1.10 (mg/dL)   Calcium 9.6  8.4 - 11.9 (mg/dL)   GFR calc non Af Amer >90  >90 (mL/min)   GFR calc Af Amer >90  >90 (mL/min)  HCG, QUANTITATIVE, PREGNANCY      Component Value Range   hCG, Beta Chain, Quant, S 1  <5 (mIU/mL)    1. Peripheral vertigo       MDM  Dizziness which seems to be vertigo, probably benign positional vertigo. No evidence of central vertigo. The patient expressed concern about possible complication of a recent ectopic pregnancy for cord she was given an injection. She states that the hCG level had been dropping. I doubt this is the case but we'll check hCG level to be sure.  She was given a dose of meclizine with good relief of symptoms. Remainder of workup is unremarkable. She will be sent home with a prescription for meclizine.  I personally performed the services described in this documentation, which was scribed in my presence. The recorded information has been reviewed and considered.           Dione Booze, MD 08/21/11 2034

## 2011-08-29 ENCOUNTER — Emergency Department (HOSPITAL_COMMUNITY)
Admission: EM | Admit: 2011-08-29 | Discharge: 2011-08-29 | Disposition: A | Discharge status: Home / Self Care | Payer: Medicare Other | Attending: Emergency Medicine | Admitting: Emergency Medicine

## 2011-08-29 ENCOUNTER — Encounter (HOSPITAL_COMMUNITY): Payer: Self-pay | Admitting: *Deleted

## 2011-08-29 ENCOUNTER — Emergency Department (HOSPITAL_COMMUNITY): Payer: Medicare Other

## 2011-08-29 DIAGNOSIS — M542 Cervicalgia: Secondary | ICD-10-CM | POA: Insufficient documentation

## 2011-08-29 DIAGNOSIS — K219 Gastro-esophageal reflux disease without esophagitis: Secondary | ICD-10-CM | POA: Insufficient documentation

## 2011-08-29 DIAGNOSIS — J449 Chronic obstructive pulmonary disease, unspecified: Secondary | ICD-10-CM | POA: Insufficient documentation

## 2011-08-29 DIAGNOSIS — R42 Dizziness and giddiness: Secondary | ICD-10-CM | POA: Insufficient documentation

## 2011-08-29 DIAGNOSIS — Z79899 Other long term (current) drug therapy: Secondary | ICD-10-CM | POA: Insufficient documentation

## 2011-08-29 DIAGNOSIS — F3130 Bipolar disorder, current episode depressed, mild or moderate severity, unspecified: Secondary | ICD-10-CM | POA: Insufficient documentation

## 2011-08-29 DIAGNOSIS — J4489 Other specified chronic obstructive pulmonary disease: Secondary | ICD-10-CM | POA: Insufficient documentation

## 2011-08-29 DIAGNOSIS — R51 Headache: Secondary | ICD-10-CM | POA: Insufficient documentation

## 2011-08-29 LAB — DIFFERENTIAL
Basophils Relative: 0 % (ref 0–1)
Eosinophils Absolute: 0.4 10*3/uL (ref 0.0–0.7)
Neutrophils Relative %: 66 % (ref 43–77)

## 2011-08-29 LAB — COMPREHENSIVE METABOLIC PANEL
ALT: 26 U/L (ref 0–35)
Albumin: 3.4 g/dL — ABNORMAL LOW (ref 3.5–5.2)
Alkaline Phosphatase: 63 U/L (ref 39–117)
Potassium: 4.1 mEq/L (ref 3.5–5.1)
Sodium: 138 mEq/L (ref 135–145)
Total Protein: 7.4 g/dL (ref 6.0–8.3)

## 2011-08-29 LAB — CBC
MCH: 27.1 pg (ref 26.0–34.0)
MCHC: 31.4 g/dL (ref 30.0–36.0)
Platelets: 354 10*3/uL (ref 150–400)
RBC: 4.31 MIL/uL (ref 3.87–5.11)

## 2011-08-29 LAB — MAGNESIUM: Magnesium: 1.9 mg/dL (ref 1.5–2.5)

## 2011-08-29 MED ORDER — MECLIZINE HCL 25 MG PO TABS: 50.0000 mg | ORAL_TABLET | Freq: Three times a day (TID) | ORAL | Status: AC | PRN

## 2011-08-29 MED ORDER — LORAZEPAM 2 MG/ML IJ SOLN
1.0000 mg | Freq: Once | INTRAMUSCULAR | Status: DC
  Administered 2011-08-29: 1 mg via INTRAVENOUS
  Filled 2011-08-29: qty 1

## 2011-08-29 MED ORDER — LORAZEPAM 1 MG PO TABS: 2.0000 mg | ORAL_TABLET | Freq: Once | ORAL | Status: DC

## 2011-08-29 NOTE — ED Notes (Signed)
Dizzy for 1 week, seen here for same,  Now has post neck pain for 3 days.  No injury known.  Bp was elevated when checked at [facility].

## 2011-08-29 NOTE — ED Provider Notes (Signed)
History     CSN: [id number]  Arrival date & time 08/29/11  1656   First MD Initiated Contact with Patient 08/29/11 1740      Chief Complaint  Patient presents with  . Dizziness    (Consider location/radiation/quality/duration/timing/severity/associated sxs/prior treatment) The history is provided by the patient.   36 year old female comes in with a ten-day history of vertigo. She has a sense of the room spinning which is worse when she stands up or turns her head. She denies nausea. Denies tinnitus or ear pain. She has had a burning sensation in her neck which radiates up to the back of her head and she rates that pain at 8/10. She was seen in the emergency department 8 days ago and prescribed meclizine but states that that has not helped. She denies fever, chills, sweats.  Past Medical History  Diagnosis Date  . Amenorrhea   . Bipolar 1 disorder   . Dysmenorrhea   . COPD (chronic obstructive pulmonary disease)   . Dysrhythmia     DR Eden Emms    . Asthma   . Sleep apnea     CPAP  . Shortness of breath     WITH EXERTION   . GERD (gastroesophageal reflux disease)     HEARTBURN   TUMS  . Neuromuscular disorder     RESTLESS LEG   . Depression     Past Surgical History  Procedure Date  . Cholecystectomy   . Tonsillectomy     No family history on file.  History  Substance Use Topics  . Smoking status: Passive Smoker    Types: Cigarettes  . Smokeless tobacco: Not on file  . Alcohol Use: No     occasional     OB History    Grav Para Term Preterm Abortions TAB SAB Ect Mult Living   1               Review of Systems  All other systems reviewed and are negative.    Allergies  Nutritional supplements; Penicillins; and Adhesive  Home Medications   Current Outpatient Rx  Name Route Sig Dispense Refill  . ACETAMINOPHEN 500 MG PO TABS Oral Take 500 mg by mouth as needed. For pain    . BUPROPION HCL ER (XL) 300 MG PO TB24 Oral Take 1 tablet (300 mg total) by  mouth daily. For depression. 30 tablet 0  . BUSPIRONE HCL 10 MG PO TABS Oral Take 10 mg by mouth 2 (two) times daily. For anxiety    . FUROSEMIDE 20 MG PO TABS Oral Take 40 mg by mouth daily. For leg edema, must take with potassium and must follow -up with primary provider to evaluate the needed replacement of potassium,    . MECLIZINE HCL 25 MG PO TABS Oral Take 1 tablet (25 mg total) by mouth 3 (three) times daily as needed. 30 tablet 0  . MULTIVITAMINS PO CAPS Oral Take 1 capsule by mouth daily. For nutritional supplementation. 30 capsule 0  . POTASSIUM CHLORIDE ER 10 MEQ PO TBCR Oral Take 1 tablet (10 mEq total) by mouth 2 (two) times daily. For potassium replacement (Lasix causes potassium loss) 60 tablet 0  . PRAZOSIN HCL 2 MG PO CAPS Oral Take 1 capsule (2 mg total) by mouth at bedtime. For insomina 30 capsule 0  . ROPINIROLE HCL 0.5 MG PO TABS Oral Take 1 tablet (0.5 mg total) by mouth at bedtime. For restless leg syndrome 30 tablet 0  . VITAMIN E  400 UNITS PO CAPS Oral Take 1 capsule (400 Units total) by mouth daily. For side effects from the Geodon 30 capsule 0  . ZIPRASIDONE HCL 60 MG PO CAPS Oral Take 1 capsule (60 mg total) by mouth 2 (two) times daily with a meal. For schizoaffective symptoms of mood and hallucinations. 60 capsule 0    BP 147/74  Pulse 83  Temp(Src) 97.9 F (36.6 C) (Oral)  Resp 16  Ht 5\' 3"  (1.6 m)  Wt 472 lb (214.098 kg)  BMI 83.61 kg/m2  SpO2 100%  LMP 06/29/2011  Physical Exam  Nursing note and vitals reviewed.  morbidly obese 36 year old female who is resting currently in no acute distress. Vital signs are significant for mild systolic hypertension with blood pressure 147/74. Oxygen saturation is 100% which is normal. Head is normocephalic and atraumatic. PERRLA, EOMI. There is no nystagmus present. Fundi show normal discs, no hemorrhage or exudate. Neck is nontender and supple. Lungs are clear without rales, wheezes, rhonchi. Heart has regular rate and  rhythm without murmur. Abdomen is obese, soft, nontender without masses or hepatosplenomegaly. Extremities have no 1+ pitting edema, no cyanosis. Full range of motion is present. Skin is warm and dry without rash. Specifically, there is no rash on her scalp or the back of her neck. Neurologic: Mental status is normal, cranial nerves are intact, there are no motor or sensory deficits. Dizziness is reproduced by head movement.  ED Course  Procedures (including critical care time)  Results for orders placed during the hospital encounter of 08/29/11  CBC      Component Value Range   WBC 13.1 (*) 4.0 - 10.5 (K/uL)   RBC 4.31  3.87 - 5.11 (MIL/uL)   Hemoglobin 11.7 (*) 12.0 - 15.0 (g/dL)   HCT 65.7  84.6 - 96.2 (%)   MCV 86.5  78.0 - 100.0 (fL)   MCH 27.1  26.0 - 34.0 (pg)   MCHC 31.4  30.0 - 36.0 (g/dL)   RDW 95.2 (*) 84.1 - 15.5 (%)   Platelets 354  150 - 400 (K/uL)  DIFFERENTIAL      Component Value Range   Neutrophils Relative 66  43 - 77 (%)   Neutro Abs 8.6 (*) 1.7 - 7.7 (K/uL)   Lymphocytes Relative 24  12 - 46 (%)   Lymphs Abs 3.1  0.7 - 4.0 (K/uL)   Monocytes Relative 8  3 - 12 (%)   Monocytes Absolute 1.0  0.1 - 1.0 (K/uL)   Eosinophils Relative 3  0 - 5 (%)   Eosinophils Absolute 0.4  0.0 - 0.7 (K/uL)   Basophils Relative 0  0 - 1 (%)   Basophils Absolute 0.0  0.0 - 0.1 (K/uL)  COMPREHENSIVE METABOLIC PANEL      Component Value Range   Sodium 138  135 - 145 (mEq/L)   Potassium 4.1  3.5 - 5.1 (mEq/L)   Chloride 100  96 - 112 (mEq/L)   CO2 29  19 - 32 (mEq/L)   Glucose, Bld 100 (*) 70 - 99 (mg/dL)   BUN 11  6 - 23 (mg/dL)   Creatinine, Ser 3.24  0.50 - 1.10 (mg/dL)   Calcium 9.8  8.4 - 40.1 (mg/dL)   Total Protein 7.4  6.0 - 8.3 (g/dL)   Albumin 3.4 (*) 3.5 - 5.2 (g/dL)   AST 21  0 - 37 (U/L)   ALT 26  0 - 35 (U/L)   Alkaline Phosphatase 63  39 - 117 (U/L)  Total Bilirubin 0.1 (*) 0.3 - 1.2 (mg/dL)   GFR calc non Af Amer >90  >90 (mL/min)   GFR calc Af Amer >90  >90  (mL/min)  MAGNESIUM      Component Value Range   Magnesium 1.9  1.5 - 2.5 (mg/dL)     1. Vertigo       MDM  Persistent vertigo which has been resistant to outpatient management. Because of the magnitude of her obesity, MRI and CT scans cannot be obtained. Old records were reviewed and she had a normal CBC and metabolic panel on her visit of May 15. Laboratory workup is initiated she'll be given a therapeutic trial of lorazepam.  She got no relief from intravenous lorazepam. Workup in the emergency department is unremarkable. I wonder if she needs a larger dose of meclizine because of her extreme weight. She will be given a trial of meclizine 50 mg 3 times a day and she is referred to a neurologist for further evaluation and to see if an MRI is able to be obtained elsewhere.      Dione Booze, MD 08/29/11 [phone]

## 2011-08-29 NOTE — Discharge Instructions (Signed)
I was unable to get an MRI scan today because of your weight. Your lab tests do not show the reason for your dizziness. Please make an appointment with neurologist. He will examine you and perhaps he will be able to arrange for an MRI scan. In the meantime, please double your dose of meclizine to see if that helps.  Vertigo Vertigo means you feel like you or your surroundings are moving when they are not. Vertigo can be dangerous if it occurs when you are at work, driving, or performing difficult activities.  CAUSES  Vertigo occurs when there is a conflict of signals sent to your brain from the visual and sensory systems in your body. There are many different causes of vertigo, including:  Infections, especially in the inner ear.   A bad reaction to a drug or misuse of alcohol and medicines.   Withdrawal from drugs or alcohol.   Rapidly changing positions, such as lying down or rolling over in bed.   A migraine headache.   Decreased blood flow to the brain.   Increased pressure in the brain from a head injury, infection, tumor, or bleeding.  SYMPTOMS  You may feel as though the world is spinning around or you are falling to the ground. Because your balance is upset, vertigo can cause nausea and vomiting. You may have involuntary eye movements (nystagmus). DIAGNOSIS  Vertigo is usually diagnosed by physical exam. If the cause of your vertigo is unknown, your caregiver may perform imaging tests, such as an MRI scan (magnetic resonance imaging). TREATMENT  Most cases of vertigo resolve on their own, without treatment. Depending on the cause, your caregiver may prescribe certain medicines. If your vertigo is related to body position issues, your caregiver may recommend movements or procedures to correct the problem. In rare cases, if your vertigo is caused by certain inner ear problems, you may need surgery. HOME CARE INSTRUCTIONS   Follow your caregiver's instructions.   Avoid driving.    Avoid operating heavy machinery.   Avoid performing any tasks that would be dangerous to you or others during a vertigo episode.   Tell your caregiver if you notice that certain medicines seem to be causing your vertigo. Some of the medicines used to treat vertigo episodes can actually make them worse in some people.  SEEK IMMEDIATE MEDICAL CARE IF:   Your medicines do not relieve your vertigo or are making it worse.   You develop problems with talking, walking, weakness, or using your arms, hands, or legs.   You develop severe headaches.   Your nausea or vomiting continues or gets worse.   You develop visual changes.   A family member notices behavioral changes.   Your condition gets worse.  MAKE SURE YOU:  Understand these instructions.   Will watch your condition.   Will get help right away if you are not doing well or get worse.  Document Released: 01/02/2005 Document Revised: 03/14/2011 Document Reviewed: 10/11/2010 [facility] Patient Information 2012 [place], [place].  Meclizine tablets or capsules What is this medicine? MECLIZINE (MEK li zeen) is an antihistamine. It is used to prevent nausea, vomiting, or dizziness caused by motion sickness. It is also used to prevent and treat vertigo (extreme dizziness or a feeling that you or your surroundings are tilting or spinning around). This medicine may be used for other purposes; ask your health care provider or pharmacist if you have questions. What should I tell my health care provider before I take this medicine?  They need to know if you have any of these conditions: -asthma -glaucoma -prostate trouble -stomach problems -urinary problems -an unusual or allergic reaction to meclizine, other medicines, foods, dyes, or preservatives -pregnant or trying to get pregnant -breast-feeding How should I use this medicine? Take this medicine by mouth with a glass of water. Follow the directions on the prescription label. If  you are using this medicine to prevent motion sickness, take the dose at least 1 hour before travel. If it upsets your stomach, take it with food or milk. Take your doses at regular intervals. Do not take your medicine more often than directed. Talk to your pediatrician regarding the use of this medicine in children. Special care may be needed. Overdosage: If you think you have taken too much of this medicine contact a poison control center or emergency room at once. NOTE: This medicine is only for you. Do not share this medicine with others. What if I miss a dose? If you miss a dose, take it as soon as you can. If it is almost time for your next dose, take only that dose. Do not take double or extra doses. What may interact with this medicine? -barbiturate medicines for inducing sleep or treating seizures -digoxin -medicines for anxiety or sleeping problems, like alprazolam, diazepam or temazepam -medicines for hay fever and other allergies -medicines for mental depression -medicines for movement abnormalities as in Parkinson's disease, or for stomach problems -medicines for pain -medicines that relax muscles This list may not describe all possible interactions. Give your health care provider a list of all the medicines, herbs, non-prescription drugs, or dietary supplements you use. Also tell them if you smoke, drink alcohol, or use illegal drugs. Some items may interact with your medicine. What should I watch for while using this medicine? If you are taking this medicine on a regular schedule, visit your doctor or health care professional for regular checks on your progress. You may get dizzy, drowsy or have blurred vision. Do not drive, use machinery, or do anything that needs mental alertness until you know how this medicine affects you. Do not stand or sit up quickly, especially if you are an older patient. This reduces the risk of dizzy or fainting spells. Alcohol can increase possible  dizziness. Avoid alcoholic drinks. Your mouth may get dry. Chewing sugarless gum or sucking hard candy, and drinking plenty of water may help. Contact your doctor if the problem does not go away or is severe. This medicine may cause dry eyes and blurred vision. If you wear contact lenses you may feel some discomfort. Lubricating drops may help. See your eye doctor if the problem does not go away or is severe. What side effects may I notice from receiving this medicine? Side effects that you should report to your doctor or health care professional as soon as possible: -fainting spells -fast or irregular heartbeat Side effects that usually do not require medical attention (report to your doctor or health care professional if they continue or are bothersome): -constipation -difficulty passing urine -difficulty sleeping -headache -stomach upset This list may not describe all possible side effects. Call your doctor for medical advice about side effects. You may report side effects to FDA at 1-800-FDA-1088. Where should I keep my medicine? Keep out of the reach of children. Store at room temperature between 15 and 30 degrees C (59 and 86 degrees F). Keep container tightly closed. Throw away any unused medicine after the expiration date. NOTE: This sheet is  a summary. It may not cover all possible information. If you have questions about this medicine, talk to your doctor, pharmacist, or health care provider.  2012, [employer]. (10/01/2007 10:35:36 AM)

## 2011-10-11 ENCOUNTER — Encounter (HOSPITAL_COMMUNITY): Payer: Self-pay | Admitting: Pharmacy Technician

## 2011-10-23 ENCOUNTER — Encounter (HOSPITAL_COMMUNITY): Payer: Self-pay

## 2011-10-23 ENCOUNTER — Encounter (HOSPITAL_COMMUNITY)
Admission: RE | Admit: 2011-10-23 | Discharge: 2011-10-23 | Disposition: A | Discharge status: Home / Self Care | Payer: Medicare Other | Source: Ambulatory Visit | Attending: Oral Surgery | Admitting: Oral Surgery

## 2011-10-23 LAB — BASIC METABOLIC PANEL
BUN: 10 mg/dL (ref 6–23)
CO2: 28 mEq/L (ref 19–32)
Chloride: 99 mEq/L (ref 96–112)
Glucose, Bld: 82 mg/dL (ref 70–99)
Potassium: 4.1 mEq/L (ref 3.5–5.1)
Sodium: 137 mEq/L (ref 135–145)

## 2011-10-23 LAB — CBC
HCT: 39.3 % (ref 36.0–46.0)
Hemoglobin: 12.5 g/dL (ref 12.0–15.0)
MCH: 27.6 pg (ref 26.0–34.0)
MCHC: 31.8 g/dL (ref 30.0–36.0)
RBC: 4.53 MIL/uL (ref 3.87–5.11)

## 2011-10-23 NOTE — Progress Notes (Signed)
Requested sleep study from [facility] , placed on chart.

## 2011-10-23 NOTE — Pre-Procedure Instructions (Signed)
20 Heather [name]  10/23/2011   Your procedure is scheduled on:  October 28, 2011 at 0930 AM  Report to [facility] at 0730 AM.  Call this number if you have problems the morning of surgery: [phone]   Remember:   Do not eat food or drink:After Midnight.  .   Take these medicines the morning of surgery with A SIP OF WATER: Wellbutrin,Buspar, Norethindrone,    Do not wear jewelry, make-up or nail polish.  Do not wear lotions, powders, or perfumes. You may wear deodorant.  Do not shave 48 hours prior to surgery. Men may shave face and neck.  Do not bring valuables to the hospital.  Contacts, dentures or bridgework may not be worn into surgery.  Leave suitcase in the car. After surgery it may be brought to your room.  For patients admitted to the hospital, checkout time is 11:00 AM the day of discharge.   Patients discharged the day of surgery will not be allowed to drive home.  Name and phone number of your driver:   Special Instructions: CHG Shower Use Special Wash: 1/2 bottle night before surgery and 1/2 bottle morning of surgery.   Please read over the following fact sheets that you were given: Pain Booklet, Coughing and Deep Breathing and Surgical Site Infection Prevention

## 2011-10-24 NOTE — Consult Note (Signed)
Anesthesia Chart Review:  Patient is a 36 year old morbidly obese female scheduled for dental extractions on 10/28/11 by Dr. Barbette Merino.  She was previously scheduled for 07/01/11, but her serum pregnancy test was positive.  Yesterday's serum pregnancy test was negative.    Other history includes former smoker, Bipolar 1 disorder, COPD, asthma, SOB, OSA with CPAP use, GERD, palpitations/PACs, prior dental extraction, cholecystectomy.  She was evaluated by Cardiologist Dr. Eden Emms in February of this year for cardiac clearance.  He felt her dyspnea was functional as she had no history of DCM, valvular heart disease, or MI.  Diagnostic testing was limited due to her size.  Ultimately, he did not recommend any additional cardiac testing pre-operatively.  He felt her biggest perioperative risks were related to her obesity and airway.  EKG from 06/29/11 showed NSR, possible LAE.    CXR on 07/04/11 showed no acute cardiopulmonary process.  Her last sleep study was in 2008.  Labs acceptable.  Anticipate she can proceed as planned.  Her Anesthesiologist will evaluate her on the day of surgery to discuss her definitive anesthesia plan and airway management.  Shonna Chock, PA-C

## 2011-10-24 NOTE — H&P (Signed)
HISTORY AND PHYSICAL  TORUNN CHANCELLOR is a 36 y.o. female patient with CC: painful teeth.  No diagnosis found.  Past Medical History  Diagnosis Date  . Amenorrhea   . Bipolar 1 disorder   . Dysmenorrhea   . COPD (chronic obstructive pulmonary disease)   . Dysrhythmia     DR Eden Emms    . Asthma   . Sleep apnea     CPAP  . Shortness of breath     WITH EXERTION   . GERD (gastroesophageal reflux disease)     HEARTBURN   TUMS  . Neuromuscular disorder     RESTLESS LEG   . Depression     No current facility-administered medications for this encounter.   Current Outpatient Prescriptions  Medication Sig Dispense Refill  . acetaminophen (TYLENOL) 500 MG tablet Take 500 mg by mouth as needed. For pain      . buPROPion (WELLBUTRIN XL) 300 MG 24 hr tablet Take 1 tablet (300 mg total) by mouth daily. For depression.  30 tablet  0  . busPIRone (BUSPAR) 10 MG tablet Take 10 mg by mouth 2 (two) times daily. For anxiety      . furosemide (LASIX) 20 MG tablet Take by mouth daily. For leg edema, must take with potassium and must follow -up with primary provider to evaluate the needed replacement of potassium,      . Multiple Vitamin (MULTIVITAMIN) capsule Take 1 capsule by mouth daily. For nutritional supplementation.  30 capsule  0  . norethindrone (MICRONOR,CAMILA,ERRIN) 0.35 MG tablet Take 1 tablet by mouth daily.      . potassium chloride (K-DUR) 10 MEQ tablet Take 1 tablet (10 mEq total) by mouth 2 (two) times daily. For potassium replacement (Lasix causes potassium loss)  60 tablet  0  . prazosin (MINIPRESS) 2 MG capsule Take 1 capsule (2 mg total) by mouth at bedtime. For insomina  30 capsule  0  . rOPINIRole (REQUIP) 0.5 MG tablet Take 1 tablet (0.5 mg total) by mouth at bedtime. For restless leg syndrome  30 tablet  0  . vitamin E 400 UNIT capsule Take 1 capsule (400 Units total) by mouth daily. For side effects from the Geodon  30 capsule  0  . ziprasidone (GEODON) 60 MG capsule Take 1  capsule (60 mg total) by mouth 2 (two) times daily with a meal. For schizoaffective symptoms of mood and hallucinations.  60 capsule  0   Allergies  Allergen Reactions  . Nutritional Supplements Anaphylaxis  . Penicillins Anaphylaxis    REACTION: Angioedema  . Adhesive (Tape) Rash   Active Problems:  * No active hospital problems. *   Vitals: There were no vitals taken for this visit. Lab results: Results for orders placed during the hospital encounter of 10/23/11 (from the past 24 hour(s))  BASIC METABOLIC PANEL     Status: Normal   Collection Time   10/23/11 11:37 AM      Component Value Range   Sodium 137  135 - 145 mEq/L   Potassium 4.1  3.5 - 5.1 mEq/L   Chloride 99  96 - 112 mEq/L   CO2 28  19 - 32 mEq/L   Glucose, Bld 82  70 - 99 mg/dL   BUN 10  6 - 23 mg/dL   Creatinine, Ser 1.61  0.50 - 1.10 mg/dL   Calcium 9.8  8.4 - 09.6 mg/dL   GFR calc non Af Amer >90  >90 mL/min   GFR calc  Af Amer >90  >90 mL/min  CBC     Status: Abnormal   Collection Time   10/23/11 11:37 AM      Component Value Range   WBC 12.1 (*) 4.0 - 10.5 K/uL   RBC 4.53  3.87 - 5.11 MIL/uL   Hemoglobin 12.5  12.0 - 15.0 g/dL   HCT 57.8  46.9 - 62.9 %   MCV 86.8  78.0 - 100.0 fL   MCH 27.6  26.0 - 34.0 pg   MCHC 31.8  30.0 - 36.0 g/dL   RDW 52.8 (*) 41.3 - 24.4 %   Platelets 348  150 - 400 K/uL  HCG, SERUM, QUALITATIVE     Status: Normal   Collection Time   10/23/11 11:37 AM      Component Value Range   Preg, Serum NEGATIVE  NEGATIVE   Radiology Results: No results found. General appearance: alert, cooperative and morbidly obese Head: Normocephalic, without obvious abnormality, atraumatic Eyes: negative Ears: normal TM's and external ear canals both ears Nose: Nares normal. Septum midline. Mucosa normal. No drainage or sinus tenderness. Throat: Gross decay and bone loss teeth #'s 1, 2, 3, 4, 11, 14, 18, 23, 24, 25, 26, 31 Neck: no adenopathy, supple, symmetrical, trachea midline and thyroid not  enlarged, symmetric, no tenderness/mass/nodules Resp: clear to auscultation bilaterally Cardio: regular rate and rhythm, S1, S2 normal, no murmur, click, rub or gallop GI: soft, non-tender; bowel sounds normal; no masses,  no organomegaly  Assessment:35 YOWF with non-restorablet eeth #'s 1, 2, 3, 4, 11, 14, 18, 23, 24, 25, 26, 31.  Plan: Full mouth extractions, alveoloplasty. General anesthesia. Day surgery.   [name] 10/24/2011

## 2011-10-28 ENCOUNTER — Encounter (HOSPITAL_COMMUNITY)
Admission: RE | Disposition: A | Discharge status: Home / Self Care | Payer: Self-pay | Source: Ambulatory Visit | Attending: Oral Surgery

## 2011-10-28 ENCOUNTER — Ambulatory Visit (HOSPITAL_COMMUNITY): Payer: Medicare Other | Admitting: Vascular Surgery

## 2011-10-28 ENCOUNTER — Encounter (HOSPITAL_COMMUNITY): Payer: Self-pay | Admitting: Vascular Surgery

## 2011-10-28 ENCOUNTER — Ambulatory Visit (HOSPITAL_COMMUNITY)
Admission: RE | Admit: 2011-10-28 | Discharge: 2011-10-28 | Disposition: A | Discharge status: Home / Self Care | Payer: Medicare Other | Source: Ambulatory Visit | Attending: Oral Surgery | Admitting: Oral Surgery

## 2011-10-28 ENCOUNTER — Encounter (HOSPITAL_COMMUNITY): Payer: Self-pay | Admitting: *Deleted

## 2011-10-28 DIAGNOSIS — M1990 Unspecified osteoarthritis, unspecified site: Secondary | ICD-10-CM

## 2011-10-28 DIAGNOSIS — F489 Nonpsychotic mental disorder, unspecified: Secondary | ICD-10-CM

## 2011-10-28 DIAGNOSIS — G4730 Sleep apnea, unspecified: Secondary | ICD-10-CM | POA: Insufficient documentation

## 2011-10-28 DIAGNOSIS — N946 Dysmenorrhea, unspecified: Secondary | ICD-10-CM

## 2011-10-28 DIAGNOSIS — F329 Major depressive disorder, single episode, unspecified: Secondary | ICD-10-CM

## 2011-10-28 DIAGNOSIS — F250 Schizoaffective disorder, bipolar type: Secondary | ICD-10-CM

## 2011-10-28 DIAGNOSIS — F32A Depression, unspecified: Secondary | ICD-10-CM

## 2011-10-28 DIAGNOSIS — Z01812 Encounter for preprocedural laboratory examination: Secondary | ICD-10-CM | POA: Insufficient documentation

## 2011-10-28 DIAGNOSIS — E785 Hyperlipidemia, unspecified: Secondary | ICD-10-CM

## 2011-10-28 DIAGNOSIS — E8881 Metabolic syndrome: Secondary | ICD-10-CM

## 2011-10-28 DIAGNOSIS — J45909 Unspecified asthma, uncomplicated: Secondary | ICD-10-CM

## 2011-10-28 DIAGNOSIS — J301 Allergic rhinitis due to pollen: Secondary | ICD-10-CM

## 2011-10-28 DIAGNOSIS — F17200 Nicotine dependence, unspecified, uncomplicated: Secondary | ICD-10-CM

## 2011-10-28 DIAGNOSIS — D72829 Elevated white blood cell count, unspecified: Secondary | ICD-10-CM

## 2011-10-28 DIAGNOSIS — K029 Dental caries, unspecified: Secondary | ICD-10-CM

## 2011-10-28 DIAGNOSIS — K219 Gastro-esophageal reflux disease without esophagitis: Secondary | ICD-10-CM

## 2011-10-28 DIAGNOSIS — J4489 Other specified chronic obstructive pulmonary disease: Secondary | ICD-10-CM | POA: Insufficient documentation

## 2011-10-28 DIAGNOSIS — F319 Bipolar disorder, unspecified: Secondary | ICD-10-CM

## 2011-10-28 DIAGNOSIS — F4321 Adjustment disorder with depressed mood: Secondary | ICD-10-CM

## 2011-10-28 DIAGNOSIS — Z01810 Encounter for preprocedural cardiovascular examination: Secondary | ICD-10-CM

## 2011-10-28 DIAGNOSIS — R002 Palpitations: Secondary | ICD-10-CM

## 2011-10-28 DIAGNOSIS — G2581 Restless legs syndrome: Secondary | ICD-10-CM

## 2011-10-28 DIAGNOSIS — K644 Residual hemorrhoidal skin tags: Secondary | ICD-10-CM

## 2011-10-28 DIAGNOSIS — N912 Amenorrhea, unspecified: Secondary | ICD-10-CM

## 2011-10-28 DIAGNOSIS — F341 Dysthymic disorder: Secondary | ICD-10-CM

## 2011-10-28 DIAGNOSIS — J449 Chronic obstructive pulmonary disease, unspecified: Secondary | ICD-10-CM | POA: Insufficient documentation

## 2011-10-28 DIAGNOSIS — I10 Essential (primary) hypertension: Secondary | ICD-10-CM

## 2011-10-28 HISTORY — PX: TOOTH EXTRACTION: SHX859

## 2011-10-28 SURGERY — DENTAL RESTORATION/EXTRACTIONS
Anesthesia: General | Site: Mouth | Laterality: Bilateral | Wound class: Clean Contaminated

## 2011-10-28 MED ORDER — FENTANYL CITRATE 0.05 MG/ML IJ SOLN
25.0000 ug | INTRAMUSCULAR | Status: DC | PRN
  Administered 2011-10-28: 50 ug via INTRAVENOUS

## 2011-10-28 MED ORDER — OXYMETAZOLINE HCL 0.05 % NA SOLN
NASAL | Status: DC | PRN
  Administered 2011-10-28: 1 via NASAL

## 2011-10-28 MED ORDER — CLINDAMYCIN PHOSPHATE 600 MG/4ML IJ SOLN
INTRAMUSCULAR | Status: AC
  Filled 2011-10-28: qty 4

## 2011-10-28 MED ORDER — 0.9 % SODIUM CHLORIDE (POUR BTL) OPTIME
TOPICAL | Status: DC | PRN
  Administered 2011-10-28: 1000 mL

## 2011-10-28 MED ORDER — VANCOMYCIN HCL IN DEXTROSE 1-5 GM/200ML-% IV SOLN
INTRAVENOUS | Status: AC
  Filled 2011-10-28: qty 200

## 2011-10-28 MED ORDER — OXYCODONE-ACETAMINOPHEN 5-325 MG PO TABS: 1.0000 | ORAL_TABLET | ORAL | Status: AC | PRN

## 2011-10-28 MED ORDER — SUCCINYLCHOLINE CHLORIDE 20 MG/ML IJ SOLN
INTRAMUSCULAR | Status: DC | PRN
  Administered 2011-10-28: 200 mg via INTRAVENOUS

## 2011-10-28 MED ORDER — LACTATED RINGERS IV SOLN: INTRAVENOUS | Status: DC

## 2011-10-28 MED ORDER — CLINDAMYCIN PHOSPHATE 600 MG/50ML IV SOLN
INTRAVENOUS | Status: DC | PRN
  Administered 2011-10-28: 600 mg via INTRAVENOUS

## 2011-10-28 MED ORDER — LIDOCAINE-EPINEPHRINE 2 %-1:100000 IJ SOLN
INTRAMUSCULAR | Status: DC | PRN
  Administered 2011-10-28: 14 mL

## 2011-10-28 MED ORDER — DEXTROSE 5 % IV SOLN
INTRAVENOUS | Status: AC
  Filled 2011-10-28: qty 50

## 2011-10-28 MED ORDER — LIDOCAINE-EPINEPHRINE 2 %-1:100000 IJ SOLN
INTRAMUSCULAR | Status: AC
  Filled 2011-10-28: qty 1

## 2011-10-28 MED ORDER — LACTATED RINGERS IV SOLN
INTRAVENOUS | Status: DC | PRN
  Administered 2011-10-28: 09:00:00 via INTRAVENOUS

## 2011-10-28 MED ORDER — FENTANYL CITRATE 0.05 MG/ML IJ SOLN
INTRAMUSCULAR | Status: AC
  Filled 2011-10-28: qty 2

## 2011-10-28 MED ORDER — ONDANSETRON HCL 4 MG/2ML IJ SOLN
INTRAMUSCULAR | Status: DC | PRN
  Administered 2011-10-28: 4 mg via INTRAVENOUS

## 2011-10-28 MED ORDER — SODIUM CHLORIDE 0.9 % IR SOLN
Status: DC | PRN
  Administered 2011-10-28: 1

## 2011-10-28 MED ORDER — FENTANYL CITRATE 0.05 MG/ML IJ SOLN
INTRAMUSCULAR | Status: DC | PRN
  Administered 2011-10-28: 100 ug via INTRAVENOUS

## 2011-10-28 MED ORDER — LIDOCAINE HCL (CARDIAC) 20 MG/ML IV SOLN
INTRAVENOUS | Status: DC | PRN
  Administered 2011-10-28: 100 mg via INTRAVENOUS

## 2011-10-28 MED ORDER — DEXAMETHASONE SODIUM PHOSPHATE 4 MG/ML IJ SOLN
INTRAMUSCULAR | Status: DC | PRN
  Administered 2011-10-28: 8 mg via INTRAVENOUS

## 2011-10-28 MED ORDER — OXYMETAZOLINE HCL 0.05 % NA SOLN
NASAL | Status: AC
  Filled 2011-10-28: qty 15

## 2011-10-28 MED ORDER — MIDAZOLAM HCL 5 MG/5ML IJ SOLN
INTRAMUSCULAR | Status: DC | PRN
  Administered 2011-10-28: 2 mg via INTRAVENOUS

## 2011-10-28 MED ORDER — PROMETHAZINE HCL 25 MG/ML IJ SOLN: 6.2500 mg | INTRAMUSCULAR | Status: DC | PRN

## 2011-10-28 MED ORDER — PROPOFOL 10 MG/ML IV EMUL
INTRAVENOUS | Status: DC | PRN
  Administered 2011-10-28: 300 mg via INTRAVENOUS

## 2011-10-28 MED ORDER — LACTATED RINGERS IV SOLN
INTRAVENOUS | Status: DC
  Administered 2011-10-28: 09:00:00 via INTRAVENOUS

## 2011-10-28 MED ORDER — MEPERIDINE HCL 25 MG/ML IJ SOLN
6.2500 mg | INTRAMUSCULAR | Status: DC | PRN
Start: 2011-10-28 — End: 2011-10-28

## 2011-10-28 SURGICAL SUPPLY — 28 items
BUR CROSS CUT FISSURE 1.6 (BURR) ×3 IMPLANT
BUR EGG ELITE 4.0 (BURR) ×3 IMPLANT
CANISTER SUCTION 2500CC (MISCELLANEOUS) ×3 IMPLANT
CLOTH BEACON ORANGE TIMEOUT ST (SAFETY) ×3 IMPLANT
COVER SURGICAL LIGHT HANDLE (MISCELLANEOUS) ×3 IMPLANT
CRADLE DONUT ADULT HEAD (MISCELLANEOUS) ×3 IMPLANT
DECANTER SPIKE VIAL GLASS SM (MISCELLANEOUS) ×3 IMPLANT
GAUZE PACKING FOLDED 2  STR (GAUZE/BANDAGES/DRESSINGS) ×1
GAUZE PACKING FOLDED 2 STR (GAUZE/BANDAGES/DRESSINGS) ×2 IMPLANT
GLOVE BIO SURGEON STRL SZ 6.5 (GLOVE) ×3 IMPLANT
GLOVE BIO SURGEON STRL SZ7 (GLOVE) ×3 IMPLANT
GLOVE BIO SURGEON STRL SZ7.5 (GLOVE) ×3 IMPLANT
GLOVE BIOGEL PI IND STRL 7.0 (GLOVE) ×2 IMPLANT
GLOVE BIOGEL PI INDICATOR 7.0 (GLOVE) ×1
GOWN STRL NON-REIN LRG LVL3 (GOWN DISPOSABLE) ×3 IMPLANT
GOWN STRL REIN XL XLG (GOWN DISPOSABLE) ×3 IMPLANT
KIT BASIN OR (CUSTOM PROCEDURE TRAY) ×3 IMPLANT
KIT ROOM TURNOVER OR (KITS) ×3 IMPLANT
NEEDLE 22X1 1/2 (OR ONLY) (NEEDLE) ×3 IMPLANT
NS IRRIG 1000ML POUR BTL (IV SOLUTION) ×3 IMPLANT
PAD ARMBOARD 7.5X6 YLW CONV (MISCELLANEOUS) ×6 IMPLANT
SUT CHROMIC 3 0 PS 2 (SUTURE) ×3 IMPLANT
SYR 50ML SLIP (SYRINGE) IMPLANT
TOWEL OR 17X26 10 PK STRL BLUE (TOWEL DISPOSABLE) ×3 IMPLANT
TRAY ENT MC OR (CUSTOM PROCEDURE TRAY) ×3 IMPLANT
TUBING IRRIGATION (MISCELLANEOUS) ×3 IMPLANT
WATER STERILE IRR 1000ML POUR (IV SOLUTION) IMPLANT
YANKAUER SUCT BULB TIP NO VENT (SUCTIONS) ×3 IMPLANT

## 2011-10-28 NOTE — Progress Notes (Signed)
Dr Willa Frater aware pt OOB in WC--rm air o2 sats 92-95%.  He is OK for pt to DC home. Pt is awake and alert, no c/0

## 2011-10-28 NOTE — Transfer of Care (Signed)
Immediate Anesthesia Transfer of Care Note  Patient: Heather [name]  Procedure(s) Performed: Procedure(s) (LRB): MULTIPLE EXTRACION WITH ALVEOLOPLASTY (Bilateral)  Patient Location: PACU  Anesthesia Type: General  Level of Consciousness: awake, alert , oriented and patient cooperative  Airway & Oxygen Therapy: Patient Spontanous Breathing and Patient connected to face mask oxygen  Post-op Assessment: Report given to PACU RN, Post -op Vital signs reviewed and stable and Patient moving all extremities  Post vital signs: Reviewed and stable  Complications: No apparent anesthesia complications

## 2011-10-28 NOTE — Anesthesia Procedure Notes (Signed)
Date/Time: 10/28/2011 9:57 AM Performed by: Jerilee Hoh Pre-anesthesia Checklist: Patient identified, Emergency Drugs available, Suction available and Patient being monitored Patient Re-evaluated:Patient Re-evaluated prior to inductionOxygen Delivery Method: Circle system utilized Preoxygenation: Pre-oxygenation with 100% oxygen Intubation Type: IV induction Ventilation: Mask ventilation with difficulty and Oral airway inserted - appropriate to patient size Grade View: Grade II Tube type: Oral Number of attempts: 2 Airway Equipment and Method: Stylet and Video-laryngoscopy Placement Confirmation: ETT inserted through vocal cords under direct vision,  positive ETCO2 and breath sounds checked- equal and bilateral Secured at: 22 cm Tube secured with: Tape Dental Injury: Teeth and Oropharynx as per pre-operative assessment  Difficulty Due To: Difficulty was anticipated Comments: DL x 1 with Glidescope.  Grade 2 view.  Unable to pass nasal ETT.  DL x 2 with Glidescope, Atraumatic oral intubation.

## 2011-10-28 NOTE — Op Note (Signed)
10/28/2011  10:30 AM  PATIENT:  Heather [name]  36 y.o. female  PRE-OPERATIVE DIAGNOSIS:  Morbid obesity, dental caries, non-restorable teeth #'s 1, 2, 3, 4, 11, 14, 18, 23, 24, 25, 26, 31  POST-OPERATIVE DIAGNOSIS:  SAME  PROCEDURE:  Procedure(s): MULTIPLE EXTRACTIONteeth #'s 1, 2, 3, 4, 11, 14, 18, 23, 24, 25, 26, 31  WITH ALVEOLOPLASTY  SURGEON:  Surgeon(s): [name], DDS  ANESTHESIA:   local and general  EBL:  minimal  DRAINS: none   SPECIMEN:  No Specimen  COUNTS:  YES  PLAN OF CARE: Discharge to home after PACU  PATIENT DISPOSITION:  PACU - hemodynamically stable.   PROCEDURE DETAILS: Dictation # [id number] [name], DMD 10/28/2011 10:30 AM

## 2011-10-28 NOTE — Preoperative (Signed)
Beta Blockers   Reason not to administer Beta Blockers:Not Applicable 

## 2011-10-28 NOTE — Anesthesia Preprocedure Evaluation (Addendum)
Anesthesia Evaluation  Patient identified by MRN, date of birth, ID band Patient awake    Reviewed: Allergy & Precautions, H&P , NPO status , Patient's Chart, lab work & pertinent test results, reviewed documented beta blocker date and time   Airway Mallampati: II TM Distance: >3 FB Neck ROM: Full    Dental No notable dental hx. (+) Poor Dentition and Dental Advisory Given   Pulmonary shortness of breath and with exertion, asthma , sleep apnea , COPD breath sounds clear to auscultation  Pulmonary exam normal       Cardiovascular hypertension, Pt. on medications - dysrhythmias Rhythm:Regular Rate:Normal     Neuro/Psych PSYCHIATRIC DISORDERS Bipolar Disorder negative neurological ROS  negative psych ROS   GI/Hepatic negative GI ROS, Neg liver ROS, GERD- (denies GERD this AM)  ,  Endo/Other  Morbid obesity  Renal/GU negative Renal ROS  negative genitourinary   Musculoskeletal negative musculoskeletal ROS (+)   Abdominal (+) + obese,   Peds negative pediatric ROS (+)  Hematology negative hematology ROS (+)   Anesthesia Other Findings   Reproductive/Obstetrics negative OB ROS                         Anesthesia Physical Anesthesia Plan  ASA: IV  Anesthesia Plan: General   Post-op Pain Management:    Induction: Intravenous  Airway Management Planned: Nasal ETT  Additional Equipment:   Intra-op Plan:   Post-operative Plan: Extubation in OR  Informed Consent: I have reviewed the patients History and Physical, chart, labs and discussed the procedure including the risks, benefits and alternatives for the proposed anesthesia with the patient or authorized representative who has indicated his/her understanding and acceptance.   Dental advisory given  Plan Discussed with: CRNA  Anesthesia Plan Comments:         Anesthesia Quick Evaluation

## 2011-10-28 NOTE — Anesthesia Postprocedure Evaluation (Signed)
  Anesthesia Post-op Note  Patient: Heather [name]  Procedure(s) Performed: Procedure(s) (LRB): MULTIPLE EXTRACION WITH ALVEOLOPLASTY (Bilateral)  Patient Location: PACU  Anesthesia Type: General  Level of Consciousness: awake and alert   Airway and Oxygen Therapy: Patient Spontanous Breathing  Post-op Pain: mild  Post-op Assessment: Post-op Vital signs reviewed, Patient's Cardiovascular Status Stable, Respiratory Function Stable, Patent Airway and No signs of Nausea or vomiting  Post-op Vital Signs: stable  Complications: No apparent anesthesia complications

## 2011-10-28 NOTE — H&P (Signed)
H&P documentation  -History and Physical Reviewed  -Patient has been re-examined  -No change in the plan of care  Heather [name]  

## 2011-10-29 NOTE — Op Note (Signed)
Heather [name], Heather [name]              ACCOUNT NO.:  [id number]  MEDICAL RECORD NO.:  [id number]  LOCATION:  [facility]                         FACILITY:  [facility]  PHYSICIAN:  Heather [name], M.D.  DATE OF BIRTH:  [birth year]  DATE OF PROCEDURE:  10/28/2011 DATE OF DISCHARGE:  10/28/2011                              OPERATIVE REPORT   PREOPERATIVE DIAGNOSES:  Morbid obesity.  Dental caries. Nonrestorable teeth numbers 1, 2, 3, 4, 11, 14, 18, 23, 24, 25, 26, 31.  POSTOPERATIVE DIAGNOSES:  Morbid obesity.  Dental caries. Nonrestorable teeth numbers 1, 2, 3, 4, 11, 14, 18, 23, 24, 25, 26, 31.  PROCEDURE:  Removal of teeth numbers 1, 2, 3, 4, 11, 14, 18, 23, 24, 25, 26, 31, alveoplasty of right maxilla and mandible.  SURGEON:  Heather Lopes, MD.  ANESTHESIA:  General, oral intubation. Dr. Acey Lav was attending.  INDICATIONS FOR PROCEDURE:  Heather [name] is a 36 year old female, who was referred to me by her general dentist for removal of all remaining teeth so that upper and lower dentures can be fabricated.  Because of the number of teeth to be removed and the need for adequate anesthesia, coupled with Mazell's morbid obesity, it was recommended that the procedure will be done at the hospital where she could be intubated for airway protection.  PROCEDURE:  The patient was taken to the operating room, placed on the table in the supine position.  General anesthesia was administered intravenously and after attempted nasal intubation without success, an oral endotracheal tube was placed and marked.  The patient was then draped for the procedure.  A time-out was performed.  The posterior pharynx was suctioned.  A throat pack was placed.  A 2% lidocaine with 1: 100,000 epinephrine was infiltrated in an inferior alveolar block on the right and left side, and an buccal and palatal infiltration around the maxillary teeth to be removed.  Total of 16 mL was utilized.  A bite block was placed in the  right side of the mouth and a sweetheart retractor was used to retract the tongue and a 15 blade was used to make a full-thickness incision around tooth number 14 and 11 carried along the crest of the alveolar ridge joining these 2 teeth and also to have an extension on the distal and proximal aspect of these 2 teeth so that the alveolar crest could be exposed.  An periosteal elevator was used to reflect the periosteum.  Interproximal bone was removed from around these teeth and the teeth were elevated and removed with a dental forceps.  Then, the egg-shaped bur was used to perform an alveoplasty and a bone file was used to further smooth the bone and then 3-0 chromic was used to Jenniges this area.  Then, the bite block and sweetheart retractor were positioned on the other side of the mouth after the endotracheal tube was moved to exit from the right side of the mouth. Then, a 15 blade was used to make a full-thickness incision around teeth numbers 1, 2, 3, 4, 23, 24, 25, 26, and 31.  The periosteum was reflected and then the interproximal bone was removed with a Striker handpiece under irrigation.  The teeth were elevated and then removed with the dental forceps.  The sockets were irrigated and curetted, and then the periosteum was further reflected to allow for alveoplasty, which was performed with the egg-shaped bur and a bone file.  Then, the areas were closed with 3-0 chromic.  Tooth number 18 was extracted prior to switching the sweetheart retractor and the endotracheal tube.  It was extracted using a periosteal elevator, a Striker handpiece, 301 elevator and dental forceps.  Then, all extraction areas were inspected and found to have good closure and hemostasis and contour.  The oral cavity was irrigated copiously and suctioned, and the throat pack was removed.  The patient was extubated and taken to the recovery room breathing spontaneously in good condition.  ESTIMATED BLOOD LOSS:   Minimum.  COMPLICATIONS:  None.  SPECIMENS:  None.     Heather [name], M.D.     SMJ/MEDQ  D:  10/28/2011  T:  10/29/2011  Job:  [id number]

## 2011-10-31 ENCOUNTER — Encounter (HOSPITAL_COMMUNITY): Payer: Self-pay | Admitting: Oral Surgery

## 2011-10-31 NOTE — OR Nursing (Signed)
10-30-2011 Procedure corrected.

## 2011-11-04 ENCOUNTER — Encounter (HOSPITAL_COMMUNITY): Payer: Self-pay | Admitting: *Deleted

## 2011-11-04 ENCOUNTER — Emergency Department (HOSPITAL_COMMUNITY)
Admission: EM | Admit: 2011-11-04 | Discharge: 2011-11-04 | Disposition: A | Discharge status: Home / Self Care | Payer: Medicare Other | Attending: Emergency Medicine | Admitting: Emergency Medicine

## 2011-11-04 ENCOUNTER — Emergency Department (HOSPITAL_COMMUNITY): Payer: Medicare Other

## 2011-11-04 DIAGNOSIS — Z79899 Other long term (current) drug therapy: Secondary | ICD-10-CM | POA: Insufficient documentation

## 2011-11-04 DIAGNOSIS — J4489 Other specified chronic obstructive pulmonary disease: Secondary | ICD-10-CM | POA: Insufficient documentation

## 2011-11-04 DIAGNOSIS — R071 Chest pain on breathing: Secondary | ICD-10-CM | POA: Insufficient documentation

## 2011-11-04 DIAGNOSIS — Z87891 Personal history of nicotine dependence: Secondary | ICD-10-CM | POA: Insufficient documentation

## 2011-11-04 DIAGNOSIS — G4730 Sleep apnea, unspecified: Secondary | ICD-10-CM | POA: Insufficient documentation

## 2011-11-04 DIAGNOSIS — R0789 Other chest pain: Secondary | ICD-10-CM

## 2011-11-04 DIAGNOSIS — F319 Bipolar disorder, unspecified: Secondary | ICD-10-CM | POA: Insufficient documentation

## 2011-11-04 DIAGNOSIS — J449 Chronic obstructive pulmonary disease, unspecified: Secondary | ICD-10-CM | POA: Insufficient documentation

## 2011-11-04 DIAGNOSIS — Z9089 Acquired absence of other organs: Secondary | ICD-10-CM | POA: Insufficient documentation

## 2011-11-04 LAB — BASIC METABOLIC PANEL
BUN: 8 mg/dL (ref 6–23)
CO2: 28 mEq/L (ref 19–32)
Chloride: 101 mEq/L (ref 96–112)
Creatinine, Ser: 0.7 mg/dL (ref 0.50–1.10)
Glucose, Bld: 96 mg/dL (ref 70–99)
Potassium: 4.2 mEq/L (ref 3.5–5.1)

## 2011-11-04 LAB — CBC WITH DIFFERENTIAL/PLATELET
HCT: 38.4 % (ref 36.0–46.0)
Hemoglobin: 12 g/dL (ref 12.0–15.0)
Lymphs Abs: 3.3 10*3/uL (ref 0.7–4.0)
MCHC: 31.3 g/dL (ref 30.0–36.0)
Monocytes Absolute: 0.6 10*3/uL (ref 0.1–1.0)
Monocytes Relative: 5 % (ref 3–12)
Neutro Abs: 8.8 10*3/uL — ABNORMAL HIGH (ref 1.7–7.7)
Neutrophils Relative %: 67 % (ref 43–77)
RBC: 4.45 MIL/uL (ref 3.87–5.11)

## 2011-11-04 MED ORDER — ASPIRIN 81 MG PO CHEW
324.0000 mg | CHEWABLE_TABLET | Freq: Once | ORAL | Status: AC
  Administered 2011-11-04: 324 mg via ORAL
  Filled 2011-11-04: qty 4

## 2011-11-04 MED ORDER — KETOROLAC TROMETHAMINE 30 MG/ML IJ SOLN
30.0000 mg | Freq: Once | INTRAMUSCULAR | Status: AC
  Administered 2011-11-04: 30 mg via INTRAVENOUS
  Filled 2011-11-04: qty 1

## 2011-11-04 MED ORDER — NAPROXEN 500 MG PO TABS: 500.0000 mg | ORAL_TABLET | Freq: Two times a day (BID) | ORAL | Status: DC

## 2011-11-04 MED ORDER — GI COCKTAIL ~~LOC~~
30.0000 mL | Freq: Once | ORAL | Status: AC
  Administered 2011-11-04: 30 mL via ORAL
  Filled 2011-11-04: qty 30

## 2011-11-04 NOTE — ED Notes (Signed)
Pt states intermittent center CP described as "tightness" x 1 week after having dental surgery. Pain became constant this afternoon.

## 2011-11-04 NOTE — ED Notes (Signed)
Patient with no complaints at this time. Respirations even and unlabored. Skin warm/dry. Discharge instructions reviewed with patient at this time. Patient given opportunity to voice concerns/ask questions. IV removed per policy and band-aid applied to site. Patient discharged at this time and left Emergency Department with steady gait.  

## 2011-11-04 NOTE — ED Provider Notes (Signed)
History   This chart was scribed for Dione Booze, MD by Melba Coon. The patient was seen in room APA16A/APA16A and the patient's care was started at 5:59PM.    CSN: [id number]  Arrival date & time 11/04/11  1723   First MD Initiated Contact with Patient 11/04/11 1740      Chief Complaint  Patient presents with  . Chest Pain    (Consider location/radiation/quality/duration/timing/severity/associated sxs/prior treatment) The history is provided by the patient. No language interpreter was used.   Heather [name] is a 36 y.o. female who presents to the Emergency Department complaining of intermittent, moderate to severe, tight chest pain with an onset a week ago that has become constant today about 5 hours ago. Pt states the pain started after her dental surgery a week ago; 12 teeth were taken out including wisdom teeth extraction. Pt presently rates the pain 8/10. Pt has been resting in bed for the past week but layng down worsens the pain while sitting up aggravates the pain. SOB, nausea, and diaphoresis present. No HA, fever, neck pain, sore throat, rash, back pain, abd pain, emesis, diarrhea, dysuria, or extremity pain, edema, weakness, numbness, or tingling. Pt has not taken ASA today. Hx of COPD and hypercholesteremia. Allergic to Bee venom; Nutritional supplements; Penicillins; and Adhesive. No other pertinent medical symptoms.  Past Medical History  Diagnosis Date  . Amenorrhea   . Bipolar 1 disorder   . Dysmenorrhea   . COPD (chronic obstructive pulmonary disease)   . Dysrhythmia     DR Eden Emms    . Asthma   . Sleep apnea     CPAP  . Shortness of breath     WITH EXERTION   . GERD (gastroesophageal reflux disease)     HEARTBURN   TUMS  . Neuromuscular disorder     RESTLESS LEG   . Depression     Past Surgical History  Procedure Date  . Cholecystectomy   . Tonsillectomy   . Tooth extraction 10/28/2011    Procedure: DENTAL RESTORATION/EXTRACTIONS;  Surgeon: [name], DDS;  Location: [facility];  Service: Oral Surgery;;  . Dental surgery     No family history on file.  History  Substance Use Topics  . Smoking status: Former Smoker -- 0.5 packs/day for 8 years    Types: Cigarettes    Quit date: 04/25/2011  . Smokeless tobacco: Never Used  . Alcohol Use: Yes     occasional     OB History    Grav Para Term Preterm Abortions TAB SAB Ect Mult Living   1               Review of Systems 10 Systems reviewed and all are negative for acute change except as noted in the HPI.   Allergies  Bee venom; Nutritional supplements; Penicillins; and Adhesive  Home Medications   Current Outpatient Rx  Name Route Sig Dispense Refill  . BUPROPION HCL ER (XL) 300 MG PO TB24 Oral Take 1 tablet (300 mg total) by mouth daily. For depression. 30 tablet 0  . BUSPIRONE HCL 10 MG PO TABS Oral Take 10 mg by mouth 2 (two) times daily. For anxiety    . FUROSEMIDE 20 MG PO TABS Oral Take by mouth daily. For leg edema, must take with potassium and must follow -up with primary provider to evaluate the needed replacement of potassium,    . MULTIVITAMINS PO CAPS Oral Take 1 capsule by mouth daily. For nutritional supplementation.  30 capsule 0  . NORETHINDRONE 0.35 MG PO TABS Oral Take 1 tablet by mouth daily.    . OXYCODONE-ACETAMINOPHEN 5-325 MG PO TABS Oral Take 1-2 tablets by mouth every 4 (four) hours as needed for pain. 40 tablet 0  . POTASSIUM CHLORIDE ER 10 MEQ PO TBCR Oral Take 1 tablet (10 mEq total) by mouth 2 (two) times daily. For potassium replacement (Lasix causes potassium loss) 60 tablet 0  . PRAZOSIN HCL 2 MG PO CAPS Oral Take 1 capsule (2 mg total) by mouth at bedtime. For insomina 30 capsule 0  . VITAMIN E 400 UNITS PO CAPS Oral Take 1 capsule (400 Units total) by mouth daily. For side effects from the Geodon 30 capsule 0  . ZIPRASIDONE HCL 60 MG PO CAPS Oral Take 120 mg by mouth at bedtime. For schizoaffective symptoms of mood and hallucinations.       BP 147/74  Pulse 97  Temp 98.9 F (37.2 C) (Oral)  Resp 18  Ht 5\' 3"  (1.6 m)  Wt 477 lb (216.366 kg)  BMI 84.50 kg/m2  SpO2 95%  LMP 09/23/2011  Physical Exam  Nursing note and vitals reviewed. Constitutional: She is oriented to person, place, and time. She appears well-developed and well-nourished. No distress.       Morbidly obese  HENT:  Head: Normocephalic and atraumatic.  Right Ear: External ear normal.  Left Ear: External ear normal.  Eyes: EOM are normal.  Neck: Normal range of motion. No tracheal deviation present.  Cardiovascular: Normal rate, regular rhythm and normal heart sounds.   No murmur heard. Pulmonary/Chest: Effort normal and breath sounds normal. No respiratory distress. She exhibits tenderness (Mild anterior chest wall tenderness).  Abdominal: There is tenderness (Mild epigastric tenderness).  Musculoskeletal: Normal range of motion. She exhibits edema (+1). She exhibits no tenderness.  Neurological: She is alert and oriented to person, place, and time.  Skin: Skin is warm and dry. No rash noted.  Psychiatric: She has a normal mood and affect. Her behavior is normal.    ED Course  Procedures (including critical care time)  DIAGNOSTIC STUDIES: Oxygen Saturation is 96% on room air, adequate by my interpretation.    COORDINATION OF CARE:  6:03PM - EDMD will order ASA, GI cocktail, CXR, blood w/u, and IV fluids for the pt.   Results for orders placed during the hospital encounter of 11/04/11  CBC WITH DIFFERENTIAL      Component Value Range   WBC 13.1 (*) 4.0 - 10.5 K/uL   RBC 4.45  3.87 - 5.11 MIL/uL   Hemoglobin 12.0  12.0 - 15.0 g/dL   HCT 78.2  95.6 - 21.3 %   MCV 86.3  78.0 - 100.0 fL   MCH 27.0  26.0 - 34.0 pg   MCHC 31.3  30.0 - 36.0 g/dL   RDW 08.6  57.8 - 46.9 %   Platelets 383  150 - 400 K/uL   Neutrophils Relative 67  43 - 77 %   Neutro Abs 8.8 (*) 1.7 - 7.7 K/uL   Lymphocytes Relative 25  12 - 46 %   Lymphs Abs 3.3  0.7 - 4.0  K/uL   Monocytes Relative 5  3 - 12 %   Monocytes Absolute 0.6  0.1 - 1.0 K/uL   Eosinophils Relative 3  0 - 5 %   Eosinophils Absolute 0.4  0.0 - 0.7 K/uL   Basophils Relative 0  0 - 1 %   Basophils Absolute 0.0  0.0 - 0.1 K/uL  BASIC METABOLIC PANEL      Component Value Range   Sodium 139  135 - 145 mEq/L   Potassium 4.2  3.5 - 5.1 mEq/L   Chloride 101  96 - 112 mEq/L   CO2 28  19 - 32 mEq/L   Glucose, Bld 96  70 - 99 mg/dL   BUN 8  6 - 23 mg/dL   Creatinine, Ser 2.95  0.50 - 1.10 mg/dL   Calcium 62.1  8.4 - 30.8 mg/dL   GFR calc non Af Amer >90  >90 mL/min   GFR calc Af Amer >90  >90 mL/min  D-DIMER, QUANTITATIVE      Component Value Range   D-Dimer, Quant 0.77 (*) 0.00 - 0.48 ug/mL-FEU  TROPONIN I      Component Value Range   Troponin I <0.30  <0.30 ng/mL   Dg Chest 2 View  11/04/2011  *RADIOLOGY REPORT*  Clinical Data: Chest pain 1 week after having dental surgery, former smoker, COPD  CHEST - 2 VIEW  Comparison: 07/04/2011; 11/24/2010  Findings: Grossly unchanged cardiac silhouette and mediastinal contours.  Evaluation of retrosternal clear space is obscured secondary to overlying soft tissues.  No focal parenchymal opacities.  No definite pleural effusion or pneumothorax.  No acute osseous abnormalities.  IMPRESSION: No acute cardiopulmonary disease.  Original Report Authenticated By: Waynard Reeds, M.D.   Images viewed by me.  ECG shows normal sinus rhythm with a rate of 96, no ectopy. Normal axis. Normal P wave. Normal QRS. Normal intervals. Normal ST and T waves. Impression: normal ECG.when compared with ECG of 06/29/2011, no significant changes are seen.   1. Chest wall pain       MDM  Chest pain which seems most likely to be 2 to GERD and likely related to recent multiple dental extractions. She'll be given a trial of a GI cocktail and laboratory workup initiated.  2000: She got no relief from a GI cocktail. Now, the pain seems to be worse when she sits upright  that when she lays down. Workup was significant for mildly elevated d-dimer, which is lower than a prior d-dimer will which was not associated with pulmonary emboli. I do not feel she needs further evaluation for possible pulmonary emboli because she was at low pretest risk.she'll begin a therapeutic trial of ketorolac.  2115: she got significant relief with ketorolac. She will be sent home with prescription for naproxen.  I personally performed the services described in this documentation, which was scribed in my presence. The recorded information has been reviewed and considered.          Dione Booze, MD 11/04/11 2122

## 2012-02-08 ENCOUNTER — Encounter (HOSPITAL_COMMUNITY): Payer: Self-pay | Admitting: Emergency Medicine

## 2012-02-08 ENCOUNTER — Emergency Department (HOSPITAL_COMMUNITY): Payer: Medicare Other

## 2012-02-08 ENCOUNTER — Emergency Department (HOSPITAL_COMMUNITY)
Admission: EM | Admit: 2012-02-08 | Discharge: 2012-02-08 | Disposition: A | Discharge status: Home / Self Care | Payer: Medicare Other | Attending: Emergency Medicine | Admitting: Emergency Medicine

## 2012-02-08 DIAGNOSIS — J449 Chronic obstructive pulmonary disease, unspecified: Secondary | ICD-10-CM | POA: Insufficient documentation

## 2012-02-08 DIAGNOSIS — G4730 Sleep apnea, unspecified: Secondary | ICD-10-CM | POA: Insufficient documentation

## 2012-02-08 DIAGNOSIS — G2581 Restless legs syndrome: Secondary | ICD-10-CM | POA: Insufficient documentation

## 2012-02-08 DIAGNOSIS — F319 Bipolar disorder, unspecified: Secondary | ICD-10-CM | POA: Insufficient documentation

## 2012-02-08 DIAGNOSIS — M25569 Pain in unspecified knee: Secondary | ICD-10-CM | POA: Insufficient documentation

## 2012-02-08 DIAGNOSIS — W19XXXA Unspecified fall, initial encounter: Secondary | ICD-10-CM

## 2012-02-08 DIAGNOSIS — Y9289 Other specified places as the place of occurrence of the external cause: Secondary | ICD-10-CM | POA: Insufficient documentation

## 2012-02-08 DIAGNOSIS — S5000XA Contusion of unspecified elbow, initial encounter: Secondary | ICD-10-CM

## 2012-02-08 DIAGNOSIS — J4489 Other specified chronic obstructive pulmonary disease: Secondary | ICD-10-CM | POA: Insufficient documentation

## 2012-02-08 DIAGNOSIS — Z91038 Other insect allergy status: Secondary | ICD-10-CM | POA: Insufficient documentation

## 2012-02-08 DIAGNOSIS — F3289 Other specified depressive episodes: Secondary | ICD-10-CM | POA: Insufficient documentation

## 2012-02-08 DIAGNOSIS — N912 Amenorrhea, unspecified: Secondary | ICD-10-CM | POA: Insufficient documentation

## 2012-02-08 DIAGNOSIS — Z888 Allergy status to other drugs, medicaments and biological substances status: Secondary | ICD-10-CM | POA: Insufficient documentation

## 2012-02-08 DIAGNOSIS — Y9301 Activity, walking, marching and hiking: Secondary | ICD-10-CM | POA: Insufficient documentation

## 2012-02-08 DIAGNOSIS — Z87891 Personal history of nicotine dependence: Secondary | ICD-10-CM | POA: Insufficient documentation

## 2012-02-08 DIAGNOSIS — M542 Cervicalgia: Secondary | ICD-10-CM | POA: Insufficient documentation

## 2012-02-08 DIAGNOSIS — K219 Gastro-esophageal reflux disease without esophagitis: Secondary | ICD-10-CM | POA: Insufficient documentation

## 2012-02-08 DIAGNOSIS — N946 Dysmenorrhea, unspecified: Secondary | ICD-10-CM | POA: Insufficient documentation

## 2012-02-08 DIAGNOSIS — Z79899 Other long term (current) drug therapy: Secondary | ICD-10-CM | POA: Insufficient documentation

## 2012-02-08 DIAGNOSIS — Z880 Allergy status to penicillin: Secondary | ICD-10-CM | POA: Insufficient documentation

## 2012-02-08 DIAGNOSIS — I499 Cardiac arrhythmia, unspecified: Secondary | ICD-10-CM | POA: Insufficient documentation

## 2012-02-08 DIAGNOSIS — F329 Major depressive disorder, single episode, unspecified: Secondary | ICD-10-CM | POA: Insufficient documentation

## 2012-02-08 DIAGNOSIS — W1789XA Other fall from one level to another, initial encounter: Secondary | ICD-10-CM | POA: Insufficient documentation

## 2012-02-08 MED ORDER — CYCLOBENZAPRINE HCL 10 MG PO TABS: 10.0000 mg | ORAL_TABLET | Freq: Three times a day (TID) | ORAL | Status: DC | PRN

## 2012-02-08 MED ORDER — HYDROCODONE-ACETAMINOPHEN 5-325 MG PO TABS: ORAL_TABLET | ORAL | Status: DC

## 2012-02-08 NOTE — ED Notes (Signed)
Pt presents with rt elbow pain, left knee pain and chronic low back pain after a fall sustained at a local retailers parking lot per pt. Pt denies LOC. Small abrasions to left knee noted. Bleeding controled.

## 2012-02-08 NOTE — ED Notes (Signed)
Pt states she slipped in grease in the [employer] parking lot. Pt c/o rt arm and left knee pain.

## 2012-02-09 NOTE — ED Provider Notes (Signed)
History     CSN: [id number]  Arrival date & time 02/08/12  1321   First MD Initiated Contact with Patient 02/08/12 1437      Chief Complaint  Patient presents with  . Fall  . Arm Pain  . Knee Pain    (Consider location/radiation/quality/duration/timing/severity/associated sxs/prior treatment) HPI Comments: Patient c/o right elbow pain and left knee pain that began after she slipped and fell in the parking lot at a local [employer]. Pain to her knee and elbow is worse with movement and weight bearing.  She also c/o "soreness" to her neck although she denies head injury, LOC, headache, dizziness, N/V, back pain or vomiting.  She reports previous left knee pain.    Patient is a 36 y.o. female presenting with fall. The history is provided by the patient.  Fall The accident occurred 1 to 2 hours ago. The fall occurred while walking. Distance fallen: from standing position. She landed on concrete. There was no blood loss. The point of impact was the left knee and right elbow. The pain is present in the right elbow and left knee. The pain is moderate. She was ambulatory at the scene. There was no entrapment after the fall. There was no drug use involved in the accident. There was no alcohol use involved in the accident. Pertinent negatives include no fever, no numbness, no bowel incontinence, no nausea, no vomiting, no hematuria, no headaches, no hearing loss, no loss of consciousness and no tingling. The symptoms are aggravated by standing, ambulation, sitting, flexion and pressure on the injury. She has tried nothing for the symptoms. The treatment provided no relief.    Past Medical History  Diagnosis Date  . Amenorrhea   . Bipolar 1 disorder   . Dysmenorrhea   . COPD (chronic obstructive pulmonary disease)   . Dysrhythmia     DR Eden Emms    . Asthma   . Sleep apnea     CPAP  . Shortness of breath     WITH EXERTION   . GERD (gastroesophageal reflux disease)     HEARTBURN   TUMS  .  Neuromuscular disorder     RESTLESS LEG   . Depression     Past Surgical History  Procedure Date  . Cholecystectomy   . Tonsillectomy   . Tooth extraction 10/28/2011    Procedure: DENTAL RESTORATION/EXTRACTIONS;  Surgeon: [name], DDS;  Location: [facility] OR;  Service: Oral Surgery;;  . Dental surgery     History reviewed. No pertinent family history.  History  Substance Use Topics  . Smoking status: Former Smoker -- 0.5 packs/day for 8 years    Types: Cigarettes    Quit date: 04/25/2011  . Smokeless tobacco: Never Used  . Alcohol Use: Yes     Comment: occasional     OB History    Grav Para Term Preterm Abortions TAB SAB Ect Mult Living   1               Review of Systems  Constitutional: Negative for fever and chills.  HENT: Positive for neck pain.   Eyes: Negative for visual disturbance.  Respiratory: Negative for shortness of breath.   Cardiovascular: Negative for chest pain.  Gastrointestinal: Negative for nausea, vomiting and bowel incontinence.  Genitourinary: Negative for dysuria, hematuria and difficulty urinating.  Musculoskeletal: Positive for arthralgias. Negative for back pain and joint swelling.  Skin: Negative for color change and wound.  Neurological: Negative for dizziness, tingling, loss of consciousness, syncope,  weakness, light-headedness, numbness and headaches.  All other systems reviewed and are negative.    Allergies  Bee venom; Nutritional supplements; Penicillins; and Adhesive  Home Medications   Current Outpatient Rx  Name  Route  Sig  Dispense  Refill  . ACETAMINOPHEN 500 MG PO TABS   Oral   Take 1,000 mg by mouth every 6 (six) hours as needed. pain         . BUPROPION HCL ER (XL) 300 MG PO TB24   Oral   Take 1 tablet (300 mg total) by mouth daily. For depression.   30 tablet   0   . BUSPIRONE HCL 10 MG PO TABS   Oral   Take 10 mg by mouth 2 (two) times daily. For anxiety         . FUROSEMIDE 20 MG PO TABS   Oral    Take by mouth daily. For leg edema, must take with potassium and must follow -up with primary provider to evaluate the needed replacement of potassium,         . MULTIVITAMINS PO CAPS   Oral   Take 1 capsule by mouth daily. For nutritional supplementation.   30 capsule   0   . POTASSIUM CHLORIDE ER 10 MEQ PO TBCR   Oral   Take 1 tablet (10 mEq total) by mouth 2 (two) times daily. For potassium replacement (Lasix causes potassium loss)   60 tablet   0   . VITAMIN E 400 UNITS PO CAPS   Oral   Take 1 capsule (400 Units total) by mouth daily. For side effects from the Geodon   30 capsule   0   . ZIPRASIDONE HCL 60 MG PO CAPS   Oral   Take 120 mg by mouth at bedtime.         . CYCLOBENZAPRINE HCL 10 MG PO TABS   Oral   Take 1 tablet (10 mg total) by mouth 3 (three) times daily as needed for muscle spasms.   21 tablet   0   . HYDROCODONE-ACETAMINOPHEN 5-325 MG PO TABS      Take one-two tabs po q 4-6 hrs prn pain   15 tablet   0   . ZIPRASIDONE HCL 60 MG PO CAPS   Oral   Take 120 mg by mouth at bedtime. For schizoaffective symptoms of mood and hallucinations.           BP 149/69  Pulse 85  Temp 97.5 F (36.4 C) (Oral)  Resp 18  Ht 5\' 3"  (1.6 m)  Wt 468 lb (212.283 kg)  BMI 82.90 kg/m2  SpO2 100%  LMP 01/21/2012  Physical Exam  Nursing note and vitals reviewed. Constitutional: She is oriented to person, place, and time. She appears well-developed and well-nourished. No distress.       Patient is morbidly obese  HENT:  Head: Normocephalic and atraumatic.  Mouth/Throat: Oropharynx is clear and moist.  Eyes: EOM are normal. Pupils are equal, round, and reactive to light.  Neck: Normal range of motion and phonation normal. Neck supple. Muscular tenderness present. No spinous process tenderness present. Normal range of motion present.  Cardiovascular: Normal rate, regular rhythm, normal heart sounds and intact distal pulses.   Pulmonary/Chest: Effort normal and  breath sounds normal. She exhibits no tenderness.  Abdominal: Soft. She exhibits no distension and no mass. There is no tenderness. There is no rebound and no guarding.  Musculoskeletal: She exhibits tenderness. She exhibits no edema.  Right elbow: She exhibits normal range of motion, no swelling, no effusion, no deformity and no laceration. tenderness found. Lateral epicondyle tenderness noted.       Left knee: She exhibits normal range of motion, no swelling, no effusion, no ecchymosis and no deformity. tenderness found. Medial joint line and lateral joint line tenderness noted. No patellar tendon tenderness noted.       Arms:      Legs:      ttp of the anterior left knee.  No erythema, bruising or step off  Deformity. Distal sensation intact, DP pulse is brisk.  ttp of the lateral right elbow w/o edema, abrasion, bruising or deformity.  Radial pulse brisk, distal sensation intact.  Neurological: She is alert and oriented to person, place, and time. She exhibits normal muscle tone. Coordination normal.  Skin: Skin is warm and dry. No erythema.    ED Course  Procedures (including critical care time)  Labs Reviewed - No data to display Dg Cervical Spine Complete  02/08/2012  *RADIOLOGY REPORT*  Clinical Data: Fall.  Knee pain.  CERVICAL SPINE - COMPLETE 4+ VIEW  Comparison: None  Findings: Reversal of normal cervical lordosis.  Vertebral body heights are well preserved.  Disc space narrowing and ventral endplate spurring is noted at the C6-7 level.  Facet joints are aligned.  No fracture or subluxation identified.  IMPRESSION:  1.  No fractures or subluxations. 2.  Degenerative disc disease. 3.  Reversal of normal cervical lordosis which may be due to patient positioning or muscle spasm.   Original Report Authenticated By: Signa Kell, M.D.    Dg Elbow Complete Right  02/08/2012  *RADIOLOGY REPORT*  Clinical Data: Fall.  Arm and knee pain  RIGHT ELBOW - COMPLETE 3+ VIEW  Comparison: None   Findings: There is no evidence of fracture or dislocation.  There is no evidence of arthropathy or other focal bone abnormality. Soft tissues are unremarkable.  IMPRESSION: No acute findings.   Original Report Authenticated By: Signa Kell, M.D.    Dg Knee Complete 4 Views Left  02/08/2012  *RADIOLOGY REPORT*  Clinical Data: , fall.  Knee pain  LEFT KNEE - COMPLETE 4+ VIEW  Comparison: None  Findings: Patella alta deformity noted.  No joint effusion.  There is moderate to advanced tricompartment osteoarthritis with exuberant marginal spur formation.  No fractures or subluxations identified.  IMPRESSION:  1. Patella Alta 2.  Osteoarthritis.   Original Report Authenticated By: Signa Kell, M.D.      1. Contusion, elbow   2. Knee pain   3. Fall       MDM    Sling applied, pain improved, remains NV intact.  Pt advised of x-ray results, agrees to f/u with orthopedics.  No focal neuro deficits, ambulates with a steady gait.     Prescribed: Flexeril norco #15     Sammi Stolarz L. [place], [place] 02/09/12 2115

## 2012-02-10 NOTE — ED Provider Notes (Signed)
Medical screening examination/treatment/procedure(s) were performed by non-physician practitioner and as supervising physician I was immediately available for consultation/collaboration.   Carleene Cooper III, MD 02/10/12 [phone]

## 2012-04-21 ENCOUNTER — Emergency Department (HOSPITAL_COMMUNITY): Payer: Medicare Other

## 2012-04-21 ENCOUNTER — Emergency Department (HOSPITAL_COMMUNITY)
Admission: EM | Admit: 2012-04-21 | Discharge: 2012-04-21 | Disposition: A | Discharge status: Home / Self Care | Payer: Medicare Other | Attending: Emergency Medicine | Admitting: Emergency Medicine

## 2012-04-21 ENCOUNTER — Encounter (HOSPITAL_COMMUNITY): Payer: Self-pay | Admitting: *Deleted

## 2012-04-21 DIAGNOSIS — Z8739 Personal history of other diseases of the musculoskeletal system and connective tissue: Secondary | ICD-10-CM | POA: Insufficient documentation

## 2012-04-21 DIAGNOSIS — J449 Chronic obstructive pulmonary disease, unspecified: Secondary | ICD-10-CM | POA: Insufficient documentation

## 2012-04-21 DIAGNOSIS — R1012 Left upper quadrant pain: Secondary | ICD-10-CM | POA: Insufficient documentation

## 2012-04-21 DIAGNOSIS — K219 Gastro-esophageal reflux disease without esophagitis: Secondary | ICD-10-CM | POA: Insufficient documentation

## 2012-04-21 DIAGNOSIS — Z8679 Personal history of other diseases of the circulatory system: Secondary | ICD-10-CM | POA: Insufficient documentation

## 2012-04-21 DIAGNOSIS — F3289 Other specified depressive episodes: Secondary | ICD-10-CM | POA: Insufficient documentation

## 2012-04-21 DIAGNOSIS — F329 Major depressive disorder, single episode, unspecified: Secondary | ICD-10-CM | POA: Insufficient documentation

## 2012-04-21 DIAGNOSIS — K279 Peptic ulcer, site unspecified, unspecified as acute or chronic, without hemorrhage or perforation: Secondary | ICD-10-CM | POA: Insufficient documentation

## 2012-04-21 DIAGNOSIS — J4489 Other specified chronic obstructive pulmonary disease: Secondary | ICD-10-CM | POA: Insufficient documentation

## 2012-04-21 DIAGNOSIS — Z8742 Personal history of other diseases of the female genital tract: Secondary | ICD-10-CM | POA: Insufficient documentation

## 2012-04-21 DIAGNOSIS — F319 Bipolar disorder, unspecified: Secondary | ICD-10-CM | POA: Insufficient documentation

## 2012-04-21 DIAGNOSIS — R0602 Shortness of breath: Secondary | ICD-10-CM | POA: Insufficient documentation

## 2012-04-21 DIAGNOSIS — Z87891 Personal history of nicotine dependence: Secondary | ICD-10-CM | POA: Insufficient documentation

## 2012-04-21 DIAGNOSIS — J45909 Unspecified asthma, uncomplicated: Secondary | ICD-10-CM | POA: Insufficient documentation

## 2012-04-21 LAB — COMPREHENSIVE METABOLIC PANEL
ALT: 27 U/L (ref 0–35)
Alkaline Phosphatase: 70 U/L (ref 39–117)
CO2: 30 mEq/L (ref 19–32)
GFR calc Af Amer: >90 mL/min (ref >90)
Glucose, Bld: 112 mg/dL — ABNORMAL HIGH (ref 70–99)
Potassium: 3.8 mEq/L (ref 3.5–5.1)
Sodium: 136 mEq/L (ref 135–145)
Total Protein: 7.2 g/dL (ref 6.0–8.3)

## 2012-04-21 LAB — TROPONIN I: Troponin I: <0.3 ng/mL (ref <0.30)

## 2012-04-21 LAB — CBC WITH DIFFERENTIAL/PLATELET
Eosinophils Absolute: 0.4 10*3/uL (ref 0.0–0.7)
Lymphocytes Relative: 22 % (ref 12–46)
Lymphs Abs: 3.1 10*3/uL (ref 0.7–4.0)
Neutro Abs: 10 10*3/uL — ABNORMAL HIGH (ref 1.7–7.7)
Neutrophils Relative %: 71 % (ref 43–77)
Platelets: 347 10*3/uL (ref 150–400)
RBC: 4.03 MIL/uL (ref 3.87–5.11)
WBC: 14.1 10*3/uL — ABNORMAL HIGH (ref 4.0–10.5)

## 2012-04-21 MED ORDER — PANTOPRAZOLE SODIUM 20 MG PO TBEC: 20.0000 mg | DELAYED_RELEASE_TABLET | Freq: Every day | ORAL | Status: DC

## 2012-04-21 MED ORDER — TRAMADOL HCL 50 MG PO TABS: 50.0000 mg | ORAL_TABLET | Freq: Four times a day (QID) | ORAL | Status: DC | PRN

## 2012-04-21 MED ORDER — PANTOPRAZOLE SODIUM 40 MG PO TBEC
40.0000 mg | DELAYED_RELEASE_TABLET | Freq: Once | ORAL | Status: AC
  Administered 2012-04-21: 40 mg via ORAL
  Filled 2012-04-21: qty 1

## 2012-04-21 MED ORDER — GI COCKTAIL ~~LOC~~
30.0000 mL | Freq: Once | ORAL | Status: AC
  Administered 2012-04-21: 30 mL via ORAL
  Filled 2012-04-21: qty 30

## 2012-04-21 NOTE — ED Notes (Signed)
Chest "tightness, " and sob, for 2 days. " Feels like golf ball in my chest"

## 2012-04-21 NOTE — Discharge Instructions (Signed)
Follow up with your md in 2-3 days for recheck °

## 2012-04-21 NOTE — ED Provider Notes (Signed)
History  This chart was scribed for Heather Lennert, MD by Ardeen Jourdain, ED Scribe. This patient was seen in room APA06/APA06 and the patient's care was started at 1853.  CSN: [id number]  Arrival date & time 04/21/12  4098   First MD Initiated Contact with Patient 04/21/12 1853      Chief Complaint  Patient presents with  . Chest Pain     Patient is a 37 y.o. female presenting with chest pain. The history is provided by the patient. No language interpreter was used.  Chest Pain The chest pain began 2 days ago. Chest pain occurs constantly. The chest pain is worsening. The pain is associated with breathing. The severity of the pain is moderate. The quality of the pain is described as aching and heavy. The pain does not radiate. Chest pain is worsened by deep breathing. Primary symptoms include shortness of breath and abdominal pain. Pertinent negatives for primary symptoms include no fever, no fatigue, no syncope, no cough, no wheezing and no nausea.  The shortness of breath began 2 days ago. The shortness of breath developed gradually.  Pertinent negatives for associated symptoms include no lower extremity edema, no numbness, no orthopnea and no weakness. She tried nothing for the symptoms. Risk factors include obesity.  Pertinent negatives for past medical history include no seizures.    Heather [name] is a 37 y.o. female who presents to the Emergency Department complaining of CP with associated abdominal pain and SOB. She states she had pain in her left lower quadrant a few days ago that resolved it self. She states the CP began a few days after that. She describes the pain as "feeling like a golf ball is in her chest."   Past Medical History  Diagnosis Date  . Amenorrhea   . Bipolar 1 disorder   . Dysmenorrhea   . COPD (chronic obstructive pulmonary disease)   . Dysrhythmia     DR Eden Emms    . Asthma   . Sleep apnea     CPAP  . Shortness of breath     WITH EXERTION   .  GERD (gastroesophageal reflux disease)     HEARTBURN   TUMS  . Neuromuscular disorder     RESTLESS LEG   . Depression     Past Surgical History  Procedure Date  . Cholecystectomy   . Tonsillectomy   . Tooth extraction 10/28/2011    Procedure: DENTAL RESTORATION/EXTRACTIONS;  Surgeon: [name], DDS;  Location: [facility] OR;  Service: Oral Surgery;;  . Dental surgery     History reviewed. No pertinent family history.  History  Substance Use Topics  . Smoking status: Former Smoker -- 0.5 packs/day for 8 years    Types: Cigarettes    Quit date: 04/25/2011  . Smokeless tobacco: Never Used  . Alcohol Use: No     Comment: occasional     OB History    Grav Para Term Preterm Abortions TAB SAB Ect Mult Living   1               Review of Systems  Constitutional: Negative for fever and fatigue.  HENT: Negative for congestion, sinus pressure and ear discharge.   Eyes: Negative for discharge.  Respiratory: Positive for shortness of breath. Negative for cough and wheezing.   Cardiovascular: Positive for chest pain. Negative for orthopnea and syncope.  Gastrointestinal: Positive for abdominal pain. Negative for nausea and diarrhea.  Genitourinary: Negative for frequency and hematuria.  Musculoskeletal: Negative for back pain.  Skin: Negative for rash.  Neurological: Negative for seizures, weakness, numbness and headaches.  Hematological: Negative.   Psychiatric/Behavioral: Negative for hallucinations.  All other systems reviewed and are negative.    Allergies  Bee venom; Nutritional supplements; Penicillins; and Adhesive  Home Medications   Current Outpatient Rx  Name  Route  Sig  Dispense  Refill  . ACETAMINOPHEN 500 MG PO TABS   Oral   Take 1,000 mg by mouth every 6 (six) hours as needed. pain         . BUPROPION HCL ER (XL) 300 MG PO TB24   Oral   Take 1 tablet (300 mg total) by mouth daily. For depression.   30 tablet   0   . BUSPIRONE HCL 10 MG PO TABS    Oral   Take 10 mg by mouth 2 (two) times daily. For anxiety         . CYCLOBENZAPRINE HCL 10 MG PO TABS   Oral   Take 1 tablet (10 mg total) by mouth 3 (three) times daily as needed for muscle spasms.   21 tablet   0   . FUROSEMIDE 20 MG PO TABS   Oral   Take by mouth daily. For leg edema, must take with potassium and must follow -up with primary provider to evaluate the needed replacement of potassium,         . HYDROCODONE-ACETAMINOPHEN 5-325 MG PO TABS      Take one-two tabs po q 4-6 hrs prn pain   15 tablet   0   . MULTIVITAMINS PO CAPS   Oral   Take 1 capsule by mouth daily. For nutritional supplementation.   30 capsule   0   . POTASSIUM CHLORIDE ER 10 MEQ PO TBCR   Oral   Take 1 tablet (10 mEq total) by mouth 2 (two) times daily. For potassium replacement (Lasix causes potassium loss)   60 tablet   0   . VITAMIN E 400 UNITS PO CAPS   Oral   Take 1 capsule (400 Units total) by mouth daily. For side effects from the Geodon   30 capsule   0   . ZIPRASIDONE HCL 60 MG PO CAPS   Oral   Take 120 mg by mouth at bedtime. For schizoaffective symptoms of mood and hallucinations.         Marland Kitchen ZIPRASIDONE HCL 60 MG PO CAPS   Oral   Take 120 mg by mouth at bedtime.           Triage Vitals: BP 148/55  Pulse 102  Temp 97.3 F (36.3 C) (Oral)  Resp 20  SpO2 100%  LMP 03/21/2012  Physical Exam  Nursing note and vitals reviewed. Constitutional: She is oriented to person, place, and time. She appears well-developed and well-nourished.       Morbidly obese  HENT:  Head: Normocephalic and atraumatic.  Mouth/Throat: Oropharynx is clear and moist. No oropharyngeal exudate.  Eyes: Conjunctivae normal and EOM are normal. No scleral icterus.  Neck: Neck supple. No thyromegaly present.  Cardiovascular: Normal rate, regular rhythm and normal heart sounds.  Exam reveals no gallop and no friction rub.   No murmur heard. Pulmonary/Chest: Effort normal and breath sounds  normal. No stridor. She has no wheezes. She has no rales. She exhibits no tenderness.  Abdominal: Soft. Bowel sounds are normal. She exhibits no distension and no mass. There is tenderness. There is no rebound and no  guarding.       Mild LUQ tenderness  Musculoskeletal: Normal range of motion. She exhibits no edema.  Lymphadenopathy:    She has no cervical adenopathy.  Neurological: She is oriented to person, place, and time. Coordination normal.  Skin: No rash noted. No erythema.  Psychiatric: She has a normal mood and affect. Her behavior is normal.    ED Course  Procedures (including critical care time)  DIAGNOSTIC STUDIES: Oxygen Saturation is 100% on room air, normal by my interpretation.    COORDINATION OF CARE:  6:56 PM: Discussed treatment plan which includes an EKG with pt at bedside and pt agreed to plan.   8:25 PM: Pt rechecked, she seems normal and comfortable.  Results for orders placed during the hospital encounter of 04/21/12  TROPONIN I      Component Value Range   Troponin I <0.30  <0.30 ng/mL  CBC WITH DIFFERENTIAL      Component Value Range   WBC 14.1 (*) 4.0 - 10.5 K/uL   RBC 4.03  3.87 - 5.11 MIL/uL   Hemoglobin 11.4 (*) 12.0 - 15.0 g/dL   HCT 16.1 (*) 09.6 - 04.5 %   MCV 88.1  78.0 - 100.0 fL   MCH 28.3  26.0 - 34.0 pg   MCHC 32.1  30.0 - 36.0 g/dL   RDW 40.9  81.1 - 91.4 %   Platelets 347  150 - 400 K/uL   Neutrophils Relative 71  43 - 77 %   Neutro Abs 10.0 (*) 1.7 - 7.7 K/uL   Lymphocytes Relative 22  12 - 46 %   Lymphs Abs 3.1  0.7 - 4.0 K/uL   Monocytes Relative 5  3 - 12 %   Monocytes Absolute 0.6  0.1 - 1.0 K/uL   Eosinophils Relative 3  0 - 5 %   Eosinophils Absolute 0.4  0.0 - 0.7 K/uL   Basophils Relative 0  0 - 1 %   Basophils Absolute 0.0  0.0 - 0.1 K/uL  COMPREHENSIVE METABOLIC PANEL      Component Value Range   Sodium 136  135 - 145 mEq/L   Potassium 3.8  3.5 - 5.1 mEq/L   Chloride 99  96 - 112 mEq/L   CO2 30  19 - 32 mEq/L    Glucose, Bld 112 (*) 70 - 99 mg/dL   BUN 9  6 - 23 mg/dL   Creatinine, Ser 7.82  0.50 - 1.10 mg/dL   Calcium 9.2  8.4 - 95.6 mg/dL   Total Protein 7.2  6.0 - 8.3 g/dL   Albumin 3.3 (*) 3.5 - 5.2 g/dL   AST 20  0 - 37 U/L   ALT 27  0 - 35 U/L   Alkaline Phosphatase 70  39 - 117 U/L   Total Bilirubin 0.2 (*) 0.3 - 1.2 mg/dL   GFR calc non Af Amer >90  >90 mL/min   GFR calc Af Amer >90  >90 mL/min   Dg Chest Port 1 View  04/21/2012  *RADIOLOGY REPORT*  Clinical Data: Chest pain, shortness of breath  PORTABLE CHEST - 1 VIEW  Comparison: 11/04/2011  Findings: Limited exam because of portable technique and patient body habitus.  Stable heart size and vascular prominence.  No definite focal pneumonia, collapse, consolidation, edema, effusion, or pneumothorax.  Trachea is midline.  IMPRESSION: Stable exam.  No superimposed acute process.   Original Report Authenticated By: Judie Petit. Miles Costain, M.D.      No diagnosis found.  Date: 04/21/2012  Rate:106  Rhythm: sinus tachycardia  QRS Axis: normal  Intervals: normal  ST/T Wave abnormalities: normal  Conduction Disutrbances:none  Narrative Interpretation:   Old EKG Reviewed: none available    MDM        The chart was scribed for me under my direct supervision.  I personally performed the history, physical, and medical decision making and all procedures in the evaluation of this patient.Heather Lennert, MD 04/21/12 2031

## 2012-05-14 ENCOUNTER — Encounter (HOSPITAL_COMMUNITY): Payer: Self-pay | Admitting: *Deleted

## 2012-05-14 ENCOUNTER — Emergency Department (HOSPITAL_COMMUNITY)
Admission: EM | Admit: 2012-05-14 | Discharge: 2012-05-14 | Disposition: A | Discharge status: Home / Self Care | Payer: Medicare Other | Attending: Emergency Medicine | Admitting: Emergency Medicine

## 2012-05-14 DIAGNOSIS — M549 Dorsalgia, unspecified: Secondary | ICD-10-CM

## 2012-05-14 DIAGNOSIS — R110 Nausea: Secondary | ICD-10-CM | POA: Insufficient documentation

## 2012-05-14 DIAGNOSIS — Z87891 Personal history of nicotine dependence: Secondary | ICD-10-CM | POA: Insufficient documentation

## 2012-05-14 DIAGNOSIS — G4730 Sleep apnea, unspecified: Secondary | ICD-10-CM | POA: Insufficient documentation

## 2012-05-14 DIAGNOSIS — R197 Diarrhea, unspecified: Secondary | ICD-10-CM

## 2012-05-14 DIAGNOSIS — Z8669 Personal history of other diseases of the nervous system and sense organs: Secondary | ICD-10-CM | POA: Insufficient documentation

## 2012-05-14 DIAGNOSIS — F319 Bipolar disorder, unspecified: Secondary | ICD-10-CM | POA: Insufficient documentation

## 2012-05-14 DIAGNOSIS — M545 Low back pain, unspecified: Secondary | ICD-10-CM | POA: Insufficient documentation

## 2012-05-14 DIAGNOSIS — K219 Gastro-esophageal reflux disease without esophagitis: Secondary | ICD-10-CM | POA: Insufficient documentation

## 2012-05-14 DIAGNOSIS — Z8679 Personal history of other diseases of the circulatory system: Secondary | ICD-10-CM | POA: Insufficient documentation

## 2012-05-14 DIAGNOSIS — J449 Chronic obstructive pulmonary disease, unspecified: Secondary | ICD-10-CM | POA: Insufficient documentation

## 2012-05-14 DIAGNOSIS — R059 Cough, unspecified: Secondary | ICD-10-CM | POA: Insufficient documentation

## 2012-05-14 DIAGNOSIS — Z3202 Encounter for pregnancy test, result negative: Secondary | ICD-10-CM | POA: Insufficient documentation

## 2012-05-14 DIAGNOSIS — R109 Unspecified abdominal pain: Secondary | ICD-10-CM

## 2012-05-14 DIAGNOSIS — R5381 Other malaise: Secondary | ICD-10-CM | POA: Insufficient documentation

## 2012-05-14 DIAGNOSIS — Z79899 Other long term (current) drug therapy: Secondary | ICD-10-CM | POA: Insufficient documentation

## 2012-05-14 DIAGNOSIS — Z8709 Personal history of other diseases of the respiratory system: Secondary | ICD-10-CM | POA: Insufficient documentation

## 2012-05-14 DIAGNOSIS — J4489 Other specified chronic obstructive pulmonary disease: Secondary | ICD-10-CM | POA: Insufficient documentation

## 2012-05-14 DIAGNOSIS — Z8739 Personal history of other diseases of the musculoskeletal system and connective tissue: Secondary | ICD-10-CM | POA: Insufficient documentation

## 2012-05-14 DIAGNOSIS — Z9981 Dependence on supplemental oxygen: Secondary | ICD-10-CM | POA: Insufficient documentation

## 2012-05-14 DIAGNOSIS — Z8742 Personal history of other diseases of the female genital tract: Secondary | ICD-10-CM | POA: Insufficient documentation

## 2012-05-14 DIAGNOSIS — R05 Cough: Secondary | ICD-10-CM | POA: Insufficient documentation

## 2012-05-14 LAB — URINALYSIS, ROUTINE W REFLEX MICROSCOPIC
Bilirubin Urine: NEGATIVE
Ketones, ur: NEGATIVE mg/dL
Nitrite: NEGATIVE
pH: 6 (ref 5.0–8.0)

## 2012-05-14 MED ORDER — ONDANSETRON 8 MG PO TBDP
8.0000 mg | ORAL_TABLET | Freq: Once | ORAL | Status: AC
  Administered 2012-05-14: 8 mg via ORAL
  Filled 2012-05-14: qty 1

## 2012-05-14 MED ORDER — OXYCODONE-ACETAMINOPHEN 5-325 MG PO TABS
2.0000 | ORAL_TABLET | Freq: Once | ORAL | Status: AC
  Administered 2012-05-14: 2 via ORAL
  Filled 2012-05-14: qty 2

## 2012-05-14 NOTE — ED Provider Notes (Signed)
History     CSN: [id number]  Arrival date & time 05/14/12  1657   First MD Initiated Contact with Patient 05/14/12 1711      Chief Complaint  Patient presents with  . Abdominal Pain     Patient is a 37 y.o. female presenting with abdominal pain. The history is provided by the patient.  Abdominal Pain The primary symptoms of the illness include abdominal pain, fatigue, nausea and diarrhea. The primary symptoms of the illness do not include vomiting, hematochezia, dysuria, vaginal discharge or vaginal bleeding. Episode onset: today. The onset of the illness was gradual. The problem has been gradually worsening.  Additional symptoms associated with the illness include back pain.  pt reports right low back/flank pain that will intermittently radiate into her abdomen She also reports about 3 episodes of nonbloody diarrhea today.  No vomiting.  No dysuria or vaginal bleeding She also reports cough  Past Medical History  Diagnosis Date  . Amenorrhea   . Bipolar 1 disorder   . Dysmenorrhea   . COPD (chronic obstructive pulmonary disease)   . Dysrhythmia     DR Eden Emms    . Asthma   . Sleep apnea     CPAP  . Shortness of breath     WITH EXERTION   . GERD (gastroesophageal reflux disease)     HEARTBURN   TUMS  . Neuromuscular disorder     RESTLESS LEG   . Depression     Past Surgical History  Procedure Date  . Cholecystectomy   . Tonsillectomy   . Tooth extraction 10/28/2011    Procedure: DENTAL RESTORATION/EXTRACTIONS;  Surgeon: [name], DDS;  Location: [facility] OR;  Service: Oral Surgery;;  . Dental surgery     History reviewed. No pertinent family history.  History  Substance Use Topics  . Smoking status: Former Smoker -- 0.5 packs/day for 8 years    Types: Cigarettes    Quit date: 04/25/2011  . Smokeless tobacco: Never Used  . Alcohol Use: No     Comment: occasional     OB History    Grav Para Term Preterm Abortions TAB SAB Ect Mult Living   1                Review of Systems  Constitutional: Positive for fatigue.  Respiratory: Positive for cough.   Gastrointestinal: Positive for nausea, abdominal pain and diarrhea. Negative for vomiting and hematochezia.  Genitourinary: Negative for dysuria, vaginal bleeding and vaginal discharge.  Musculoskeletal: Positive for back pain.  Neurological: Negative for weakness.  Psychiatric/Behavioral: Negative for agitation.  All other systems reviewed and are negative.    Allergies  Bee venom; Nutritional supplements; Penicillins; and Adhesive  Home Medications   Current Outpatient Rx  Name  Route  Sig  Dispense  Refill  . ACETAMINOPHEN 500 MG PO TABS   Oral   Take 1,000 mg by mouth every 6 (six) hours as needed. pain         . BENZTROPINE MESYLATE 1 MG PO TABS   Oral   Take 1 mg by mouth at bedtime.         . BUPROPION HCL ER (XL) 150 MG PO TB24   Oral   Take 150 mg by mouth every morning.         . BUSPIRONE HCL 10 MG PO TABS   Oral   Take 10 mg by mouth 2 (two) times daily. For anxiety         .  FUROSEMIDE 20 MG PO TABS   Oral   Take 40 mg by mouth daily. For leg edema, must take with potassium and must follow -up with primary provider to evaluate the needed replacement of potassium,         . MULTIVITAMINS PO CAPS   Oral   Take 1 capsule by mouth daily. For nutritional supplementation.   30 capsule   0   . PANTOPRAZOLE SODIUM 20 MG PO TBEC   Oral   Take 1 tablet (20 mg total) by mouth daily.   30 tablet   0   . POTASSIUM CHLORIDE ER 10 MEQ PO TBCR   Oral   Take 1 tablet (10 mEq total) by mouth 2 (two) times daily. For potassium replacement (Lasix causes potassium loss)   60 tablet   0   . VITAMIN E 400 UNITS PO CAPS   Oral   Take 1 capsule (400 Units total) by mouth daily. For side effects from the Geodon   30 capsule   0   . ZIPRASIDONE HCL 60 MG PO CAPS   Oral   Take 120 mg by mouth every evening.            BP 149/84  Pulse 108  Temp 97.3 F  (36.3 C) (Oral)  Resp 20  Ht 5\' 3"  (1.6 m)  Wt 484 lb 9.6 oz (219.813 kg)  BMI 85.84 kg/m2  SpO2 98%  LMP 03/21/2012  Physical Exam CONSTITUTIONAL: Well developed/well nourished HEAD AND FACE: Normocephalic/atraumatic EYES: EOMI/PERRL ENMT: Mucous membranes moist NECK: supple no meningeal signs SPINE:entire spine nontender CV: S1/S2 noted, no murmurs/rubs/gallops noted LUNGS: Lungs are clear to auscultation bilaterally, no apparent distress ABDOMEN: soft, obese nontender, no rebound or guarding GU:no cva tenderness NEURO: Pt is awake/alert, moves all extremitiesx4, she is able to flex/extend both hips/knees without difficulty EXTREMITIES: pulses normal, full ROM SKIN: warm, color normal PSYCH: no abnormalities of mood noted  ED Course  Procedures (including critical care time)   Labs Reviewed  URINALYSIS, ROUTINE W REFLEX MICROSCOPIC   5:47 PM Pt well appearing, no distress.  Her abdomen is benign.  No lower abd tenderness noted.  No flank/CVA tenderness noted.  Suspicion for acute abdominal/gynecologic process is low 7:44 PM Pt clinically well appearing, watching TV.  She reports improvement.  Her abdomen is soft.  I doubt acute appendicitis or other acute process.     MDM  Nursing notes including past medical history and social history reviewed and considered in documentation Labs/vital reviewed and considered         [name], MD 05/14/12 1945

## 2012-05-14 NOTE — ED Notes (Addendum)
Pain rt side of back and around to abd with cramping, diarrhea, nausea, no vomiting.  Cough for 2 days, green sputum. Headache

## 2012-05-14 NOTE — ED Notes (Signed)
Discharge instructions given and reviewed with patient.  Patient verbalized understanding to follow up with primary MD as needed.  Patient ambulatory; discharged home in good condition.

## 2012-05-23 ENCOUNTER — Other Ambulatory Visit: Payer: Self-pay

## 2012-07-10 ENCOUNTER — Emergency Department (HOSPITAL_COMMUNITY)
Admission: EM | Admit: 2012-07-10 | Discharge: 2012-07-10 | Disposition: A | Discharge status: Home / Self Care | Payer: Medicare Other | Attending: Emergency Medicine | Admitting: Emergency Medicine

## 2012-07-10 ENCOUNTER — Encounter (HOSPITAL_COMMUNITY): Payer: Self-pay | Admitting: *Deleted

## 2012-07-10 DIAGNOSIS — Z8709 Personal history of other diseases of the respiratory system: Secondary | ICD-10-CM | POA: Insufficient documentation

## 2012-07-10 DIAGNOSIS — R52 Pain, unspecified: Secondary | ICD-10-CM | POA: Insufficient documentation

## 2012-07-10 DIAGNOSIS — R059 Cough, unspecified: Secondary | ICD-10-CM | POA: Insufficient documentation

## 2012-07-10 DIAGNOSIS — F329 Major depressive disorder, single episode, unspecified: Secondary | ICD-10-CM | POA: Insufficient documentation

## 2012-07-10 DIAGNOSIS — Z8669 Personal history of other diseases of the nervous system and sense organs: Secondary | ICD-10-CM | POA: Insufficient documentation

## 2012-07-10 DIAGNOSIS — Z9089 Acquired absence of other organs: Secondary | ICD-10-CM | POA: Insufficient documentation

## 2012-07-10 DIAGNOSIS — F3289 Other specified depressive episodes: Secondary | ICD-10-CM | POA: Insufficient documentation

## 2012-07-10 DIAGNOSIS — J029 Acute pharyngitis, unspecified: Secondary | ICD-10-CM | POA: Insufficient documentation

## 2012-07-10 DIAGNOSIS — R05 Cough: Secondary | ICD-10-CM | POA: Insufficient documentation

## 2012-07-10 DIAGNOSIS — G4730 Sleep apnea, unspecified: Secondary | ICD-10-CM | POA: Insufficient documentation

## 2012-07-10 DIAGNOSIS — F319 Bipolar disorder, unspecified: Secondary | ICD-10-CM | POA: Insufficient documentation

## 2012-07-10 DIAGNOSIS — Z79899 Other long term (current) drug therapy: Secondary | ICD-10-CM | POA: Insufficient documentation

## 2012-07-10 DIAGNOSIS — J449 Chronic obstructive pulmonary disease, unspecified: Secondary | ICD-10-CM | POA: Insufficient documentation

## 2012-07-10 DIAGNOSIS — K219 Gastro-esophageal reflux disease without esophagitis: Secondary | ICD-10-CM | POA: Insufficient documentation

## 2012-07-10 DIAGNOSIS — Z87891 Personal history of nicotine dependence: Secondary | ICD-10-CM | POA: Insufficient documentation

## 2012-07-10 DIAGNOSIS — Z8742 Personal history of other diseases of the female genital tract: Secondary | ICD-10-CM | POA: Insufficient documentation

## 2012-07-10 DIAGNOSIS — J4489 Other specified chronic obstructive pulmonary disease: Secondary | ICD-10-CM | POA: Insufficient documentation

## 2012-07-10 MED ORDER — MAGIC MOUTHWASH W/LIDOCAINE: ORAL | Status: DC

## 2012-07-10 NOTE — ED Provider Notes (Signed)
Medical screening examination/treatment/procedure(s) were performed by non-physician practitioner and as supervising physician I was immediately available for consultation/collaboration.   Dione Booze, MD 07/10/12 [phone]

## 2012-07-10 NOTE — ED Provider Notes (Signed)
History     CSN: [id number]  Arrival date & time 07/10/12  1251   First MD Initiated Contact with Patient 07/10/12 1338      Chief Complaint  Patient presents with  . Sore Throat    (Consider location/radiation/quality/duration/timing/severity/associated sxs/prior treatment) HPI Heather [name] is a 37 y.o. female who presents to the ED with a sore throat.  The sore throat started 4 days ago. She had fever and chills one night but not today. She has body aches, so nausea but no vomiting.  Past Medical History  Diagnosis Date  . Amenorrhea   . Bipolar 1 disorder   . Dysmenorrhea   . COPD (chronic obstructive pulmonary disease)   . Dysrhythmia     DR Eden Emms    . Asthma   . Sleep apnea     CPAP  . Shortness of breath     WITH EXERTION   . GERD (gastroesophageal reflux disease)     HEARTBURN   TUMS  . Neuromuscular disorder     RESTLESS LEG   . Depression     Past Surgical History  Procedure Laterality Date  . Cholecystectomy    . Tonsillectomy    . Tooth extraction  10/28/2011    Procedure: DENTAL RESTORATION/EXTRACTIONS;  Surgeon: [name], DDS;  Location: [facility] OR;  Service: Oral Surgery;;  . Dental surgery      History reviewed. No pertinent family history.  History  Substance Use Topics  . Smoking status: Former Smoker -- 0.50 packs/day for 8 years    Types: Cigarettes    Quit date: 04/25/2011  . Smokeless tobacco: Never Used  . Alcohol Use: No     Comment: occasional     OB History   Grav Para Term Preterm Abortions TAB SAB Ect Mult Living   1               Review of Systems  Constitutional: Negative for fever, chills and activity change.  HENT: Negative for neck pain.   Eyes: Negative for visual disturbance.  Respiratory: Positive for cough.   Cardiovascular: Negative for chest pain.  Gastrointestinal: Negative for nausea, vomiting and abdominal pain.  Musculoskeletal: Negative for myalgias.  Skin: Negative for rash.    Allergies  Bee  venom; Nutritional supplements; Penicillins; and Adhesive  Home Medications   Current Outpatient Rx  Name  Route  Sig  Dispense  Refill  . acetaminophen (TYLENOL) 500 MG tablet   Oral   Take 1,000 mg by mouth every 6 (six) hours as needed. pain         . benztropine (COGENTIN) 1 MG tablet   Oral   Take 1 mg by mouth at bedtime.         Marland Kitchen buPROPion (WELLBUTRIN XL) 150 MG 24 hr tablet   Oral   Take 150 mg by mouth every morning.         . busPIRone (BUSPAR) 10 MG tablet   Oral   Take 10 mg by mouth 2 (two) times daily. For anxiety         . furosemide (LASIX) 20 MG tablet   Oral   Take 40 mg by mouth daily. For leg edema, must take with potassium and must follow -up with primary provider to evaluate the needed replacement of potassium,         . Multiple Vitamin (MULTIVITAMIN) capsule   Oral   Take 1 capsule by mouth daily. For nutritional supplementation.   30  capsule   0   . omeprazole (PRILOSEC) 20 MG capsule   Oral   Take 20 mg by mouth daily.         . potassium chloride (K-DUR) 10 MEQ tablet   Oral   Take 1 tablet (10 mEq total) by mouth 2 (two) times daily. For potassium replacement (Lasix causes potassium loss)   60 tablet   0   . ziprasidone (GEODON) 60 MG capsule   Oral   Take 120 mg by mouth every evening.            BP 144/43  Pulse 99  Temp(Src) 98.5 F (36.9 C) (Oral)  Resp 20  Ht 5\' 3"  (1.6 m)  Wt 474 lb (215.005 kg)  BMI 83.99 kg/m2  SpO2 100%  LMP 06/03/2012  Physical Exam  Constitutional: She is oriented to person, place, and time. No distress.  Morbidly obese W/F  HENT:  Head: Atraumatic.  Mouth/Throat: Uvula is midline. Posterior oropharyngeal erythema present.  Oral ulcers noted oral mucosa and posterior pharynx  Eyes: EOM are normal.  Neck: Neck supple.  Cardiovascular: Normal rate and regular rhythm.   Pulmonary/Chest: Effort normal and breath sounds normal.  Musculoskeletal: Normal range of motion.   Lymphadenopathy:    She has cervical adenopathy.  Neurological: She is alert and oriented to person, place, and time.  Skin: Skin is warm and dry.  Psychiatric: She has a normal mood and affect. Her behavior is normal. Judgment and thought content normal.   Results for orders placed during the hospital encounter of 07/10/12 (from the past 24 hour(s))  RAPID STREP SCREEN     Status: None   Collection Time    07/10/12  1:37 PM      Result Value Range   Streptococcus, Group A Screen (Direct) NEGATIVE  NEGATIVE    ED Course  Procedures (including critical care time)   MDM  37 y.o. female with sore throat, rapid strep negative. Oral ulcers consistent with viral illness.  I have reviewed this patient's vital signs, nurses notes, appropriate labs and and discussed plan of care with the patient. She voices understanding.    Medication List    TAKE these medications       magic mouthwash w/lidocaine Soln  Swish and gargle with 2 ml TID as needed for sore throat or oral ulcers      ASK your doctor about these medications       acetaminophen 500 MG tablet  Commonly known as:  TYLENOL  Take 1,000 mg by mouth every 6 (six) hours as needed. pain     benztropine 1 MG tablet  Commonly known as:  COGENTIN  Take 1 mg by mouth at bedtime.     buPROPion 150 MG 24 hr tablet  Commonly known as:  WELLBUTRIN XL  Take 150 mg by mouth every morning.     busPIRone 10 MG tablet  Commonly known as:  BUSPAR  Take 10 mg by mouth 2 (two) times daily. For anxiety     furosemide 20 MG tablet  Commonly known as:  LASIX  Take 40 mg by mouth daily. For leg edema, must take with potassium and must follow -up with primary provider to evaluate the needed replacement of potassium,     multivitamin capsule  Take 1 capsule by mouth daily. For nutritional supplementation.     omeprazole 20 MG capsule  Commonly known as:  PRILOSEC  Take 20 mg by mouth daily.  potassium chloride 10 MEQ tablet   Commonly known as:  K-DUR  Take 1 tablet (10 mEq total) by mouth 2 (two) times daily. For potassium replacement (Lasix causes potassium loss)     ziprasidone 60 MG capsule  Commonly known as:  GEODON  Take 120 mg by mouth every evening.             [facility] Orlene Och, [place] 07/10/12 1426

## 2012-07-10 NOTE — ED Notes (Signed)
Sore throat, body aches, chills.  Nausea, no vomiting or diarrhea.

## 2012-08-14 ENCOUNTER — Emergency Department (HOSPITAL_COMMUNITY)
Admission: EM | Admit: 2012-08-14 | Discharge: 2012-08-14 | Disposition: A | Discharge status: Home / Self Care | Payer: Medicare Other | Attending: Emergency Medicine | Admitting: Emergency Medicine

## 2012-08-14 ENCOUNTER — Encounter (HOSPITAL_COMMUNITY): Payer: Self-pay | Admitting: *Deleted

## 2012-08-14 DIAGNOSIS — F319 Bipolar disorder, unspecified: Secondary | ICD-10-CM | POA: Insufficient documentation

## 2012-08-14 DIAGNOSIS — Z87891 Personal history of nicotine dependence: Secondary | ICD-10-CM | POA: Insufficient documentation

## 2012-08-14 DIAGNOSIS — N898 Other specified noninflammatory disorders of vagina: Secondary | ICD-10-CM | POA: Insufficient documentation

## 2012-08-14 DIAGNOSIS — J4489 Other specified chronic obstructive pulmonary disease: Secondary | ICD-10-CM | POA: Insufficient documentation

## 2012-08-14 DIAGNOSIS — R109 Unspecified abdominal pain: Secondary | ICD-10-CM | POA: Insufficient documentation

## 2012-08-14 DIAGNOSIS — Z880 Allergy status to penicillin: Secondary | ICD-10-CM | POA: Insufficient documentation

## 2012-08-14 DIAGNOSIS — J449 Chronic obstructive pulmonary disease, unspecified: Secondary | ICD-10-CM | POA: Insufficient documentation

## 2012-08-14 DIAGNOSIS — Z8669 Personal history of other diseases of the nervous system and sense organs: Secondary | ICD-10-CM | POA: Insufficient documentation

## 2012-08-14 DIAGNOSIS — Z8719 Personal history of other diseases of the digestive system: Secondary | ICD-10-CM | POA: Insufficient documentation

## 2012-08-14 DIAGNOSIS — Z3202 Encounter for pregnancy test, result negative: Secondary | ICD-10-CM | POA: Insufficient documentation

## 2012-08-14 DIAGNOSIS — Z9981 Dependence on supplemental oxygen: Secondary | ICD-10-CM | POA: Insufficient documentation

## 2012-08-14 DIAGNOSIS — Z8742 Personal history of other diseases of the female genital tract: Secondary | ICD-10-CM | POA: Insufficient documentation

## 2012-08-14 DIAGNOSIS — G4730 Sleep apnea, unspecified: Secondary | ICD-10-CM | POA: Insufficient documentation

## 2012-08-14 DIAGNOSIS — Z8709 Personal history of other diseases of the respiratory system: Secondary | ICD-10-CM | POA: Insufficient documentation

## 2012-08-14 DIAGNOSIS — Z8679 Personal history of other diseases of the circulatory system: Secondary | ICD-10-CM | POA: Insufficient documentation

## 2012-08-14 DIAGNOSIS — Z9089 Acquired absence of other organs: Secondary | ICD-10-CM | POA: Insufficient documentation

## 2012-08-14 DIAGNOSIS — R197 Diarrhea, unspecified: Secondary | ICD-10-CM | POA: Insufficient documentation

## 2012-08-14 DIAGNOSIS — Z79899 Other long term (current) drug therapy: Secondary | ICD-10-CM | POA: Insufficient documentation

## 2012-08-14 LAB — URINE MICROSCOPIC-ADD ON

## 2012-08-14 LAB — URINALYSIS, ROUTINE W REFLEX MICROSCOPIC
Bilirubin Urine: NEGATIVE
Glucose, UA: NEGATIVE mg/dL
Ketones, ur: NEGATIVE mg/dL
Leukocytes, UA: NEGATIVE
Nitrite: NEGATIVE
Protein, ur: NEGATIVE mg/dL
Specific Gravity, Urine: 1.02 (ref 1.005–1.030)
Urobilinogen, UA: 0.2 mg/dL (ref 0.0–1.0)
pH: 6 (ref 5.0–8.0)

## 2012-08-14 LAB — PREGNANCY, URINE: Preg Test, Ur: NEGATIVE

## 2012-08-14 NOTE — ED Provider Notes (Signed)
History    37yF with abdominal pain. Lower abdomen. Started around 1330 today. Diarrhea. Vaginal discharge. Abdominal pain was crampy in nature. Currently resolved. No fever or chills. No n/v. No urinary complaints.  CSN: [id number]  Arrival date & time 08/14/12  1800   First MD Initiated Contact with Patient 08/14/12 1828      Chief Complaint  Patient presents with  . Pelvic Pain    (Consider location/radiation/quality/duration/timing/severity/associated sxs/prior treatment) HPI  Past Medical History  Diagnosis Date  . Amenorrhea   . Bipolar 1 disorder   . Dysmenorrhea   . COPD (chronic obstructive pulmonary disease)   . Dysrhythmia     DR Eden Emms    . Asthma   . Sleep apnea     CPAP  . Shortness of breath     WITH EXERTION   . GERD (gastroesophageal reflux disease)     HEARTBURN   TUMS  . Neuromuscular disorder     RESTLESS LEG   . Depression     Past Surgical History  Procedure Laterality Date  . Cholecystectomy    . Tonsillectomy    . Tooth extraction  10/28/2011    Procedure: DENTAL RESTORATION/EXTRACTIONS;  Surgeon: [name], DDS;  Location: [facility] OR;  Service: Oral Surgery;;  . Dental surgery      History reviewed. No pertinent family history.  History  Substance Use Topics  . Smoking status: Former Smoker -- 0.50 packs/day for 8 years    Types: Cigarettes    Quit date: 04/25/2011  . Smokeless tobacco: Never Used  . Alcohol Use: No     Comment: occasional     OB History   Grav Para Term Preterm Abortions TAB SAB Ect Mult Living   1               Review of Systems  All systems reviewed and negative, other than as noted in HPI.   Allergies  Bee venom; Nutritional supplements; Penicillins; and Adhesive  Home Medications   Current Outpatient Rx  Name  Route  Sig  Dispense  Refill  . acetaminophen (TYLENOL) 500 MG tablet   Oral   Take 1,000 mg by mouth every 6 (six) hours as needed. pain         . benztropine (COGENTIN) 1 MG  tablet   Oral   Take 1 mg by mouth at bedtime.         Marland Kitchen buPROPion (WELLBUTRIN XL) 150 MG 24 hr tablet   Oral   Take 150 mg by mouth every morning.         . busPIRone (BUSPAR) 10 MG tablet   Oral   Take 10 mg by mouth 2 (two) times daily. For anxiety         . furosemide (LASIX) 20 MG tablet   Oral   Take 40 mg by mouth daily. For leg edema, must take with potassium and must follow -up with primary provider to evaluate the needed replacement of potassium,         . omeprazole (PRILOSEC) 20 MG capsule   Oral   Take 20 mg by mouth daily.         . potassium chloride (K-DUR) 10 MEQ tablet   Oral   Take 1 tablet (10 mEq total) by mouth 2 (two) times daily. For potassium replacement (Lasix causes potassium loss)   60 tablet   0   . ziprasidone (GEODON) 60 MG capsule   Oral   Take 120  mg by mouth every evening.            BP 149/85  Pulse 104  Temp(Src) 99.9 F (37.7 C) (Oral)  Resp 20  Ht 5\' 3"  (1.6 m)  Wt 474 lb (215.005 kg)  BMI 83.99 kg/m2  SpO2 97%  LMP 08/01/2012  Physical Exam  Nursing note and vitals reviewed. Constitutional: She appears well-developed and well-nourished. No distress.  Laying in bed. NAD. Morbidly obese.  HENT:  Head: Normocephalic and atraumatic.  Eyes: Conjunctivae are normal. Right eye exhibits no discharge. Left eye exhibits no discharge.  Neck: Neck supple.  Cardiovascular: Normal rate, regular rhythm and normal heart sounds.  Exam reveals no gallop and no friction rub.   No murmur heard. Pulmonary/Chest: Effort normal and breath sounds normal. No respiratory distress.  Abdominal: Soft. She exhibits no distension. There is no tenderness.  Exam limited with body habitus but doesn't seem to be tender  Musculoskeletal: She exhibits no edema and no tenderness.  Neurological: She is alert.  Skin: Skin is warm and dry.  Psychiatric: She has a normal mood and affect. Her behavior is normal. Thought content normal.    ED  Course  Procedures (including critical care time)  Labs Reviewed  URINALYSIS, ROUTINE W REFLEX MICROSCOPIC - Abnormal; Notable for the following:    Hgb urine dipstick TRACE (*)    All other components within normal limits  PREGNANCY, URINE  URINE MICROSCOPIC-ADD ON   No results found.   1. Diarrhea   2. Vaginal discharge       MDM  37 year old female with diarrhea, pelvic pain or vaginal discharge. Patient reports that her pelvic pain essentially resolved. She is refusing pelvic examination. She states she has "trust issues." Discussed with patient that exam would be done in the company of a female chaperone and that her boyfriend to remain present if she would like. She continues to decline. She understands that she is not receiving an adequate assessment by deferring this. She has medical decision-making capability. Urinalysis unremarkable. Patient is mildly tachycardic but her blood pressure is fine. She appears to be in no acute distress. Return precautions discussed. Outpatient followup with her PCP or gynecologist otherwise.      Raeford Razor, MD 08/18/12 1459

## 2012-08-14 NOTE — ED Notes (Signed)
Pelvic pain, onset 130pm ,had a green liquid from vagina and diarrhea.  No N/V Has had a cough today

## 2012-08-15 ENCOUNTER — Encounter (HOSPITAL_COMMUNITY): Payer: Self-pay | Admitting: Family

## 2012-08-15 ENCOUNTER — Inpatient Hospital Stay (HOSPITAL_COMMUNITY)
Admission: AD | Admit: 2012-08-15 | Discharge: 2012-08-15 | Disposition: A | Discharge status: Home / Self Care | Payer: Medicare Other | Source: Ambulatory Visit | Attending: Obstetrics & Gynecology | Admitting: Obstetrics & Gynecology

## 2012-08-15 DIAGNOSIS — N949 Unspecified condition associated with female genital organs and menstrual cycle: Secondary | ICD-10-CM | POA: Insufficient documentation

## 2012-08-15 DIAGNOSIS — B3731 Acute candidiasis of vulva and vagina: Secondary | ICD-10-CM | POA: Insufficient documentation

## 2012-08-15 DIAGNOSIS — N926 Irregular menstruation, unspecified: Secondary | ICD-10-CM | POA: Insufficient documentation

## 2012-08-15 DIAGNOSIS — B373 Candidiasis of vulva and vagina: Secondary | ICD-10-CM

## 2012-08-15 LAB — WET PREP, GENITAL
Clue Cells Wet Prep HPF POC: NONE SEEN
Trich, Wet Prep: NONE SEEN

## 2012-08-15 MED ORDER — FLUCONAZOLE 150 MG PO TABS
150.0000 mg | ORAL_TABLET | Freq: Once | ORAL | Status: AC
  Administered 2012-08-15: 150 mg via ORAL
  Filled 2012-08-15: qty 1

## 2012-08-15 MED ORDER — FLUCONAZOLE 150 MG PO TABS: 150.0000 mg | ORAL_TABLET | Freq: Once | ORAL | Status: DC

## 2012-08-15 NOTE — MAU Note (Signed)
Patient presents to MAU with c/o SOB, dizziness, edema in R leg since yesterday. Reports green vaginal discharge yesterday; denies new sexual partner.  LMP 4/26.

## 2012-08-15 NOTE — MAU Note (Signed)
Pt report haivng some pelvic pain yesterday. Noticed some greenish vaginal discharge and also having diarrhea. Pt reports still having pain today and feeling a little light headed

## 2012-08-15 NOTE — MAU Provider Note (Signed)
History     CSN: [id number]  Arrival date and time: 08/15/12 1340   None     Chief Complaint  Patient presents with  . Pelvic Pain   HPI Pt is not pregnant and presents with green vaginal discharge; pt was seen at [facility] yesterday but vaginal exam not done. Pt states that she had some prior itching, but is better now.Pt has had yeast vaginitis.  Pt is sexually active.  Pt is established at [facility].  Pt has massively obese with last weight 475lb and has multiple medical issues including Bipolar Affective disorder, dysmetabolic syndrome, COPD, PCOS with irregular menses- LMP 08/01/2012 RN note: Patient presents to MAU with c/o SOB, dizziness, edema in R leg since yesterday. Reports green vaginal discharge yesterday; denies new sexual partner.    Past Medical History  Diagnosis Date  . Amenorrhea   . Bipolar 1 disorder   . Dysmenorrhea   . COPD (chronic obstructive pulmonary disease)   . Dysrhythmia     DR Eden Emms    . Asthma   . Sleep apnea     CPAP  . Shortness of breath     WITH EXERTION   . GERD (gastroesophageal reflux disease)     HEARTBURN   TUMS  . Neuromuscular disorder     RESTLESS LEG   . Depression     Past Surgical History  Procedure Laterality Date  . Cholecystectomy    . Tonsillectomy    . Tooth extraction  10/28/2011    Procedure: DENTAL RESTORATION/EXTRACTIONS;  Surgeon: [name], DDS;  Location: [facility] OR;  Service: Oral Surgery;;  . Dental surgery      History reviewed. No pertinent family history.  History  Substance Use Topics  . Smoking status: Former Smoker -- 0.50 packs/day for 8 years    Types: Cigarettes    Quit date: 04/25/2011  . Smokeless tobacco: Never Used  . Alcohol Use: No     Comment: occasional     Allergies:  Allergies  Allergen Reactions  . Bee Venom Shortness Of Breath  . Nutritional Supplements Anaphylaxis  . Penicillins Anaphylaxis    REACTION: Angioedema  . Adhesive (Tape) Rash    Prescriptions prior  to admission  Medication Sig Dispense Refill  . acetaminophen (TYLENOL) 500 MG tablet Take 1,000 mg by mouth every 6 (six) hours as needed. pain      . benztropine (COGENTIN) 1 MG tablet Take 0.5 mg by mouth 2 (two) times daily.       Marland Kitchen buPROPion (WELLBUTRIN XL) 150 MG 24 hr tablet Take 150 mg by mouth every morning.      . busPIRone (BUSPAR) 10 MG tablet Take 10 mg by mouth 2 (two) times daily. For anxiety      . furosemide (LASIX) 20 MG tablet Take 40 mg by mouth daily. For leg edema, must take with potassium and must follow -up with primary provider to evaluate the needed replacement of potassium,      . potassium chloride (K-DUR) 10 MEQ tablet Take 1 tablet (10 mEq total) by mouth 2 (two) times daily. For potassium replacement (Lasix causes potassium loss)  60 tablet  0  . Prenatal Vit-Fe Fumarate-FA (PRENATAL MULTIVITAMIN) TABS Take 1 tablet by mouth daily at 12 noon.      . vitamin E 400 UNIT capsule Take 400 Units by mouth daily.      . ziprasidone (GEODON) 60 MG capsule Take 120 mg by mouth every evening.  ROS Physical Exam   Blood pressure 131/68, pulse 98, temperature 98.4 F (36.9 C), temperature source Oral, resp. rate 20, height 5\' 3"  (1.6 m), weight 215.005 kg (474 lb), last menstrual period 08/01/2012, SpO2 96.00%.  Physical Exam  Nursing note and vitals reviewed. Constitutional: She is oriented to person, place, and time. She appears well-developed.  Morbid obesity  HENT:  Head: Normocephalic.  Eyes: Pupils are equal, round, and reactive to light.  Neck: Normal range of motion. Neck supple.  Cardiovascular: Normal rate.   Respiratory:  SpO2 96%; pt SOB with exertion  Genitourinary:  Limited exam due to habitus and inability to position pt; vaginal mucosa reddened- unable to to do speculum or bimanual  Musculoskeletal: Normal range of motion.  Neurological: She is alert and oriented to person, place, and time.  Skin: Skin is warm.    MAU Course   Procedures Q-tip swab for wet prep Results for orders placed during the hospital encounter of 08/15/12 (from the past 24 hour(s))  WET PREP, GENITAL     Status: Abnormal   Collection Time    08/15/12  3:00 PM      Result Value Range   Yeast Wet Prep HPF POC NONE SEEN  NONE SEEN   Trich, Wet Prep NONE SEEN  NONE SEEN   Clue Cells Wet Prep HPF POC NONE SEEN  NONE SEEN   WBC, Wet Prep HPF POC FEW (*) NONE SEEN  will treat clinically for Diflucan and give prescription for Diflucan GC/chlamydia- pending F/u at [facility]   Assessment and Plan  Yeast vaginitis- Diflucan 150mg  given in MAU ; prescription for 2nd if needed F/u with [facility]  LINEBERRY,SUSAN 08/15/2012, 3:26 PM

## 2012-08-21 ENCOUNTER — Encounter: Payer: Self-pay | Admitting: *Deleted

## 2012-08-24 ENCOUNTER — Encounter: Payer: Self-pay | Admitting: Obstetrics and Gynecology

## 2012-08-24 ENCOUNTER — Ambulatory Visit (INDEPENDENT_AMBULATORY_CARE_PROVIDER_SITE_OTHER): Payer: Medicare Other | Admitting: Obstetrics and Gynecology

## 2012-08-24 VITALS — BP 142/88 | Ht 63.0 in | Wt 176.8 lb

## 2012-08-24 DIAGNOSIS — B373 Candidiasis of vulva and vagina: Secondary | ICD-10-CM

## 2012-08-24 DIAGNOSIS — Z6841 Body Mass Index (BMI) 40.0 and over, adult: Secondary | ICD-10-CM

## 2012-08-24 MED ORDER — FLUCONAZOLE 150 MG PO TABS: 150.0000 mg | ORAL_TABLET | Freq: Once | ORAL | Status: DC

## 2012-08-24 NOTE — Patient Instructions (Addendum)
Please discuss with Dr Creta Levin your concerns about leg swelling. Your Weight has increase from 415 in 2006 to 476.8 today. That is a little over one pound a month since 2006.  The swelling will be difficult to control without weight loss program [facility] [employer] has programs that would help you increase your activiity, You have seen physical therapy at the [facility] PT department.  Please use those exercises.   We will fill the diflucan prescription x2

## 2012-08-24 NOTE — Progress Notes (Signed)
Heather [name] returns for followup of vaginal discharge treated with Diflucan x 5 days with refils given, and patient taking the refils even tho she allegedly had response to initial tx. Weight continues to increase averaging 8 pound/year since 2006 (415-->476.8) . Pt perceives herself to be active sufficiently Pt does not count calories.  Pt has primary care Dr Chinita Greenland of [facility], has appt there 08/26/12  Plan: Pt refuses exam Rx pt 2 additional Rx for Diflucan My continued concern for the wt gain discussed\ Will CC: to Dr Creta Levin

## 2012-11-15 ENCOUNTER — Emergency Department (HOSPITAL_COMMUNITY): Payer: Medicare Other

## 2012-11-15 ENCOUNTER — Emergency Department (HOSPITAL_COMMUNITY)
Admission: EM | Admit: 2012-11-15 | Discharge: 2012-11-15 | Disposition: A | Discharge status: Home / Self Care | Payer: Medicare Other | Attending: Emergency Medicine | Admitting: Emergency Medicine

## 2012-11-15 ENCOUNTER — Encounter (HOSPITAL_COMMUNITY): Payer: Self-pay | Admitting: Emergency Medicine

## 2012-11-15 DIAGNOSIS — R059 Cough, unspecified: Secondary | ICD-10-CM | POA: Insufficient documentation

## 2012-11-15 DIAGNOSIS — J441 Chronic obstructive pulmonary disease with (acute) exacerbation: Secondary | ICD-10-CM | POA: Insufficient documentation

## 2012-11-15 DIAGNOSIS — J40 Bronchitis, not specified as acute or chronic: Secondary | ICD-10-CM

## 2012-11-15 DIAGNOSIS — R51 Headache: Secondary | ICD-10-CM | POA: Insufficient documentation

## 2012-11-15 DIAGNOSIS — Z8719 Personal history of other diseases of the digestive system: Secondary | ICD-10-CM | POA: Insufficient documentation

## 2012-11-15 DIAGNOSIS — R05 Cough: Secondary | ICD-10-CM | POA: Insufficient documentation

## 2012-11-15 DIAGNOSIS — R0981 Nasal congestion: Secondary | ICD-10-CM

## 2012-11-15 DIAGNOSIS — J3489 Other specified disorders of nose and nasal sinuses: Secondary | ICD-10-CM | POA: Insufficient documentation

## 2012-11-15 DIAGNOSIS — Z9981 Dependence on supplemental oxygen: Secondary | ICD-10-CM | POA: Insufficient documentation

## 2012-11-15 DIAGNOSIS — J029 Acute pharyngitis, unspecified: Secondary | ICD-10-CM | POA: Insufficient documentation

## 2012-11-15 DIAGNOSIS — Z8679 Personal history of other diseases of the circulatory system: Secondary | ICD-10-CM | POA: Insufficient documentation

## 2012-11-15 DIAGNOSIS — G2581 Restless legs syndrome: Secondary | ICD-10-CM | POA: Insufficient documentation

## 2012-11-15 DIAGNOSIS — F411 Generalized anxiety disorder: Secondary | ICD-10-CM | POA: Insufficient documentation

## 2012-11-15 DIAGNOSIS — F3289 Other specified depressive episodes: Secondary | ICD-10-CM | POA: Insufficient documentation

## 2012-11-15 DIAGNOSIS — Z880 Allergy status to penicillin: Secondary | ICD-10-CM | POA: Insufficient documentation

## 2012-11-15 DIAGNOSIS — F329 Major depressive disorder, single episode, unspecified: Secondary | ICD-10-CM | POA: Insufficient documentation

## 2012-11-15 DIAGNOSIS — R52 Pain, unspecified: Secondary | ICD-10-CM | POA: Insufficient documentation

## 2012-11-15 DIAGNOSIS — R000 Tachycardia, unspecified: Secondary | ICD-10-CM | POA: Insufficient documentation

## 2012-11-15 DIAGNOSIS — G4730 Sleep apnea, unspecified: Secondary | ICD-10-CM | POA: Insufficient documentation

## 2012-11-15 DIAGNOSIS — Z8742 Personal history of other diseases of the female genital tract: Secondary | ICD-10-CM | POA: Insufficient documentation

## 2012-11-15 DIAGNOSIS — Z79899 Other long term (current) drug therapy: Secondary | ICD-10-CM | POA: Insufficient documentation

## 2012-11-15 DIAGNOSIS — R6883 Chills (without fever): Secondary | ICD-10-CM | POA: Insufficient documentation

## 2012-11-15 DIAGNOSIS — M542 Cervicalgia: Secondary | ICD-10-CM | POA: Insufficient documentation

## 2012-11-15 DIAGNOSIS — R599 Enlarged lymph nodes, unspecified: Secondary | ICD-10-CM | POA: Insufficient documentation

## 2012-11-15 MED ORDER — GUAIFENESIN-CODEINE 100-10 MG/5ML PO SYRP: 5.0000 mL | ORAL_SOLUTION | Freq: Three times a day (TID) | ORAL | Status: DC | PRN

## 2012-11-15 MED ORDER — ALBUTEROL SULFATE HFA 108 (90 BASE) MCG/ACT IN AERS: 1.0000 | INHALATION_SPRAY | Freq: Four times a day (QID) | RESPIRATORY_TRACT | Status: DC | PRN

## 2012-11-15 MED ORDER — AZITHROMYCIN 250 MG PO TABS: 250.0000 mg | ORAL_TABLET | Freq: Every day | ORAL | Status: DC

## 2012-11-15 MED ORDER — ALBUTEROL SULFATE (5 MG/ML) 0.5% IN NEBU
2.5000 mg | INHALATION_SOLUTION | Freq: Once | RESPIRATORY_TRACT | Status: AC
  Administered 2012-11-15: 2.5 mg via RESPIRATORY_TRACT
  Filled 2012-11-15: qty 0.5

## 2012-11-15 MED ORDER — IPRATROPIUM BROMIDE 0.02 % IN SOLN
0.5000 mg | Freq: Once | RESPIRATORY_TRACT | Status: AC
  Administered 2012-11-15: 0.5 mg via RESPIRATORY_TRACT
  Filled 2012-11-15: qty 2.5

## 2012-11-15 NOTE — ED Notes (Signed)
States that she has been feeling ill with nasal congestion, sore throat and body aches over the last 2 days.

## 2012-11-15 NOTE — ED Provider Notes (Signed)
CSN: [id number]     Arrival date & time 11/15/12  1248 History     First MD Initiated Contact with Patient 11/15/12 1355     Chief Complaint  Patient presents with  . Nasal Congestion  . Generalized Body Aches  . Sore Throat   (Consider location/radiation/quality/duration/timing/severity/associated sxs/prior Treatment) Patient is a 37 y.o. female presenting with pharyngitis. The history is provided by the patient.  Sore Throat This is a new problem. The current episode started in the past 7 days. The problem occurs constantly. The problem has been gradually worsening. Associated symptoms include chills, coughing, headaches, myalgias, neck pain, a sore throat and swollen glands. Pertinent negatives include no abdominal pain, anorexia, fever, nausea, numbness, rash, urinary symptoms, vertigo, visual change, vomiting or weakness. The symptoms are aggravated by coughing. She has tried acetaminophen for the symptoms. The treatment provided no relief.    Past Medical History  Diagnosis Date  . Amenorrhea   . Dysmenorrhea   . COPD (chronic obstructive pulmonary disease)   . Dysrhythmia     DR Eden Emms    . Asthma   . Sleep apnea     CPAP  . Shortness of breath     WITH EXERTION   . GERD (gastroesophageal reflux disease)     HEARTBURN   TUMS  . Neuromuscular disorder     RESTLESS LEG   . Depression   . Anxiety   . Obesity    Past Surgical History  Procedure Laterality Date  . Cholecystectomy    . Tonsillectomy    . Tooth extraction  10/28/2011    Procedure: DENTAL RESTORATION/EXTRACTIONS;  Surgeon: [name], DDS;  Location: [facility] OR;  Service: Oral Surgery;;  . Dental surgery     Family History  Problem Relation Age of Onset  . Depression Mother   . Hypertension Sister   . Diabetes Paternal Uncle   . Anxiety disorder Paternal Uncle    History  Substance Use Topics  . Smoking status: Former Smoker -- 0.50 packs/day for 8 years    Types: Cigarettes    Quit date:  04/25/2011  . Smokeless tobacco: Never Used  . Alcohol Use: No     Comment: occasional    OB History   Grav Para Term Preterm Abortions TAB SAB Ect Mult Living   1    1  1         Review of Systems  Constitutional: Positive for chills. Negative for fever.  HENT: Positive for sore throat and neck pain.   Eyes: Negative for visual disturbance.  Respiratory: Positive for cough.   Gastrointestinal: Negative for nausea, vomiting, abdominal pain and anorexia.  Musculoskeletal: Positive for myalgias.  Skin: Negative for rash.  Neurological: Positive for headaches. Negative for dizziness, vertigo, weakness and numbness.  Psychiatric/Behavioral: The patient is not nervous/anxious.     Allergies  Bee venom; Nutritional supplements; Penicillins; and Adhesive  Home Medications   Current Outpatient Rx  Name  Route  Sig  Dispense  Refill  . acetaminophen (TYLENOL) 500 MG tablet   Oral   Take 1,000 mg by mouth every 6 (six) hours as needed. pain         . benztropine (COGENTIN) 1 MG tablet   Oral   Take 0.5 mg by mouth 2 (two) times daily.          Marland Kitchen buPROPion (WELLBUTRIN XL) 150 MG 24 hr tablet   Oral   Take 150 mg by mouth every morning.         Marland Kitchen  busPIRone (BUSPAR) 10 MG tablet   Oral   Take 10 mg by mouth 2 (two) times daily. For anxiety         . furosemide (LASIX) 20 MG tablet   Oral   Take 40 mg by mouth daily. For leg edema, must take with potassium and must follow -up with primary provider to evaluate the needed replacement of potassium,         . potassium chloride (K-DUR) 10 MEQ tablet   Oral   Take 1 tablet (10 mEq total) by mouth 2 (two) times daily. For potassium replacement (Lasix causes potassium loss)   60 tablet   0   . Prenatal Vit-Fe Fumarate-FA (PRENATAL MULTIVITAMIN) TABS   Oral   Take 1 tablet by mouth daily at 12 noon.         Marland Kitchen rOPINIRole (REQUIP) 1 MG tablet   Oral   Take 1 mg by mouth at bedtime.         . vitamin E 400 UNIT  capsule   Oral   Take 400 Units by mouth daily.         . ziprasidone (GEODON) 60 MG capsule   Oral   Take 60 mg by mouth every evening.           BP 138/84  Pulse 101  Temp(Src) 98.3 F (36.8 C) (Oral)  Resp 20  Ht 5\' 3"  (1.6 m)  Wt 478 lb (216.819 kg)  BMI 84.69 kg/m2  SpO2 99%  LMP 10/16/2012 Physical Exam  Nursing note and vitals reviewed. Constitutional: She is oriented to person, place, and time.  Morbidly obese  HENT:  Head: Normocephalic and atraumatic.  Right Ear: Tympanic membrane normal.  Left Ear: Tympanic membrane normal.  Nose: Rhinorrhea present.  Mouth/Throat: Uvula is midline and mucous membranes are normal. Posterior oropharyngeal erythema present.  Eyes: EOM are normal.  Neck: Neck supple.  Cardiovascular: Tachycardia present.   Pulmonary/Chest: Effort normal. She has wheezes. She has rhonchi.  Abdominal: Soft. There is no tenderness.  Musculoskeletal: Normal range of motion.  Neurological: She is alert and oriented to person, place, and time. No cranial nerve deficit.  Skin: Skin is warm and dry.  Psychiatric: She has a normal mood and affect. Her behavior is normal.   Dg Chest [facility] 1 View  11/15/2012   *RADIOLOGY REPORT*  Clinical Data: Cough.  Rales.  PORTABLE CHEST - 1 VIEW  Comparison: 04/21/2012  Findings: Upper normal heart size.  Clear lungs.  No pneumothorax or pleural effusion.  IMPRESSION: No active cardiopulmonary disease.   Original Report Authenticated By: Jolaine Click, M.D.    ED Course  Albuterol/Atrovent Neb treatment with some improvement.  Procedures MDM  37 y.o. female with bronchitis will treat with albuterol inhaler, Z-Pak, cough medication and she is to follow up with her PCP I have reviewed this patient's vital signs, nurses notes, appropriate labs and imaging.  I have discussed findings and plan of care with the patient and she voices understanding.   Medication List    TAKE these medications       albuterol 108 (90  BASE) MCG/ACT inhaler  Commonly known as:  PROVENTIL HFA;VENTOLIN HFA  Inhale 1-2 puffs into the lungs every 6 (six) hours as needed for wheezing.     azithromycin 250 MG tablet  Commonly known as:  ZITHROMAX  Take 1 tablet (250 mg total) by mouth daily. Take first 2 tablets together, then 1 every day until finished.  guaiFENesin-codeine 100-10 MG/5ML syrup  Commonly known as:  ROBITUSSIN AC  Take 5 mLs by mouth 3 (three) times daily as needed for cough.      ASK your doctor about these medications       acetaminophen 500 MG tablet  Commonly known as:  TYLENOL  Take 1,000 mg by mouth every 6 (six) hours as needed. pain     benztropine 1 MG tablet  Commonly known as:  COGENTIN  Take 0.5 mg by mouth 2 (two) times daily.     buPROPion 150 MG 24 hr tablet  Commonly known as:  WELLBUTRIN XL  Take 150 mg by mouth every morning.     busPIRone 10 MG tablet  Commonly known as:  BUSPAR  Take 10 mg by mouth 2 (two) times daily. For anxiety     furosemide 20 MG tablet  Commonly known as:  LASIX  Take 40 mg by mouth daily. For leg edema, must take with potassium and must follow -up with primary provider to evaluate the needed replacement of potassium,     potassium chloride 10 MEQ tablet  Commonly known as:  K-DUR  Take 1 tablet (10 mEq total) by mouth 2 (two) times daily. For potassium replacement (Lasix causes potassium loss)     prenatal multivitamin Tabs tablet  Take 1 tablet by mouth daily at 12 noon.     rOPINIRole 1 MG tablet  Commonly known as:  REQUIP  Take 1 mg by mouth at bedtime.     vitamin E 400 UNIT capsule  Take 400 Units by mouth daily.     ziprasidone 60 MG capsule  Commonly known as:  GEODON  Take 60 mg by mouth every evening.         [facility] Orlene Och, [place] 11/16/12 [phone]

## 2012-11-18 NOTE — ED Provider Notes (Signed)
Medical screening examination/treatment/procedure(s) were performed by non-physician practitioner and as supervising physician I was immediately available for consultation/collaboration. Devoria Albe, MD, Armando Gang   Ward Givens, MD 11/18/12 1130

## 2013-02-11 ENCOUNTER — Other Ambulatory Visit: Payer: Self-pay

## 2013-02-18 ENCOUNTER — Emergency Department (HOSPITAL_COMMUNITY)
Admission: EM | Admit: 2013-02-18 | Discharge: 2013-02-18 | Disposition: A | Discharge status: Home / Self Care | Payer: Medicare Other | Attending: Emergency Medicine | Admitting: Emergency Medicine

## 2013-02-18 ENCOUNTER — Encounter (HOSPITAL_COMMUNITY): Payer: Self-pay | Admitting: Emergency Medicine

## 2013-02-18 DIAGNOSIS — F329 Major depressive disorder, single episode, unspecified: Secondary | ICD-10-CM | POA: Insufficient documentation

## 2013-02-18 DIAGNOSIS — Z880 Allergy status to penicillin: Secondary | ICD-10-CM | POA: Insufficient documentation

## 2013-02-18 DIAGNOSIS — R51 Headache: Secondary | ICD-10-CM | POA: Insufficient documentation

## 2013-02-18 DIAGNOSIS — R197 Diarrhea, unspecified: Secondary | ICD-10-CM | POA: Insufficient documentation

## 2013-02-18 DIAGNOSIS — Z79899 Other long term (current) drug therapy: Secondary | ICD-10-CM | POA: Insufficient documentation

## 2013-02-18 DIAGNOSIS — Z87891 Personal history of nicotine dependence: Secondary | ICD-10-CM | POA: Insufficient documentation

## 2013-02-18 DIAGNOSIS — Z3202 Encounter for pregnancy test, result negative: Secondary | ICD-10-CM | POA: Insufficient documentation

## 2013-02-18 DIAGNOSIS — G4730 Sleep apnea, unspecified: Secondary | ICD-10-CM | POA: Insufficient documentation

## 2013-02-18 DIAGNOSIS — F3289 Other specified depressive episodes: Secondary | ICD-10-CM | POA: Insufficient documentation

## 2013-02-18 DIAGNOSIS — Z8719 Personal history of other diseases of the digestive system: Secondary | ICD-10-CM | POA: Insufficient documentation

## 2013-02-18 DIAGNOSIS — J4489 Other specified chronic obstructive pulmonary disease: Secondary | ICD-10-CM | POA: Insufficient documentation

## 2013-02-18 DIAGNOSIS — R110 Nausea: Secondary | ICD-10-CM | POA: Insufficient documentation

## 2013-02-18 DIAGNOSIS — J449 Chronic obstructive pulmonary disease, unspecified: Secondary | ICD-10-CM | POA: Insufficient documentation

## 2013-02-18 DIAGNOSIS — Z8742 Personal history of other diseases of the female genital tract: Secondary | ICD-10-CM | POA: Insufficient documentation

## 2013-02-18 DIAGNOSIS — Z8669 Personal history of other diseases of the nervous system and sense organs: Secondary | ICD-10-CM | POA: Insufficient documentation

## 2013-02-18 DIAGNOSIS — F411 Generalized anxiety disorder: Secondary | ICD-10-CM | POA: Insufficient documentation

## 2013-02-18 HISTORY — DX: Morbid (severe) obesity due to excess calories: E66.01

## 2013-02-18 LAB — CBC WITH DIFFERENTIAL/PLATELET
Basophils Absolute: 0 10*3/uL (ref 0.0–0.1)
Basophils Relative: 0 % (ref 0–1)
Eosinophils Relative: 4 % (ref 0–5)
HCT: 40.9 % (ref 36.0–46.0)
MCHC: 32.3 g/dL (ref 30.0–36.0)
MCV: 88.1 fL (ref 78.0–100.0)
Monocytes Absolute: 0.7 10*3/uL (ref 0.1–1.0)
RDW: 15.5 % (ref 11.5–15.5)

## 2013-02-18 LAB — POCT I-STAT TROPONIN I: Troponin i, poc: 0 ng/mL (ref 0.00–0.08)

## 2013-02-18 LAB — URINE MICROSCOPIC-ADD ON

## 2013-02-18 LAB — COMPREHENSIVE METABOLIC PANEL
AST: 22 U/L (ref 0–37)
Albumin: 3.9 g/dL (ref 3.5–5.2)
CO2: 24 mEq/L (ref 19–32)
Calcium: 9.8 mg/dL (ref 8.4–10.5)
Creatinine, Ser: 0.61 mg/dL (ref 0.50–1.10)
GFR calc non Af Amer: >90 mL/min (ref >90)

## 2013-02-18 LAB — URINALYSIS, ROUTINE W REFLEX MICROSCOPIC
Glucose, UA: NEGATIVE mg/dL
Nitrite: NEGATIVE
Protein, ur: NEGATIVE mg/dL
Urobilinogen, UA: 0.2 mg/dL (ref 0.0–1.0)

## 2013-02-18 LAB — LIPASE, BLOOD: Lipase: 42 U/L (ref 11–59)

## 2013-02-18 MED ORDER — IBUPROFEN 800 MG PO TABS
800.0000 mg | ORAL_TABLET | Freq: Once | ORAL | Status: AC
  Administered 2013-02-18: 800 mg via ORAL
  Filled 2013-02-18: qty 1

## 2013-02-18 MED ORDER — ONDANSETRON 4 MG PO TBDP
4.0000 mg | ORAL_TABLET | Freq: Once | ORAL | Status: AC
  Administered 2013-02-18: 4 mg via ORAL
  Filled 2013-02-18: qty 1

## 2013-02-18 MED ORDER — ONDANSETRON 4 MG PO TBDP: 4.0000 mg | ORAL_TABLET | Freq: Three times a day (TID) | ORAL | Status: DC | PRN

## 2013-02-18 NOTE — ED Provider Notes (Signed)
CSN: [id number]     Arrival date & time 02/18/13  1433 History   First MD Initiated Contact with Patient 02/18/13 1741     Chief Complaint  Patient presents with  . Abdominal Pain  . Diarrhea   (Consider location/radiation/quality/duration/timing/severity/associated sxs/prior Treatment) The history is provided by the patient and medical records.   This is a 37 year old female with past medical history significant for COPD, asthma, sleep apnea, depression, anxiety, morbid obesity, presented to the ED for suspected mass of her right upper quadrant. Patient states she's been having some generalized abdominal pain and nausea for the past several days, states today it has been mild. Patient states this occurs independent of type of food. Patient states yesterday she had a few episodes of block, tarry diarrhea. Denies any gross blood. States this has since resolved she has not had a bowel movement today. Patient has had a prior cholecystectomy in 2001. She denies any urinary symptoms or vaginal complaints. Denies any fevers, sweats, or chills.  Has been tolerating PO without difficulty throughout the day today.  Pt also complains of mild headache-- no photophobia, phonophobia, aura, tinnitus, visual disturbance, or changes in speech.  No meds taken PTA.  VS stable on arrival.  Past Medical History  Diagnosis Date  . Amenorrhea   . Dysmenorrhea   . COPD (chronic obstructive pulmonary disease)   . Dysrhythmia     DR Eden Emms    . Asthma   . Sleep apnea     CPAP  . Shortness of breath     WITH EXERTION   . GERD (gastroesophageal reflux disease)     HEARTBURN   TUMS  . Neuromuscular disorder     RESTLESS LEG   . Depression   . Anxiety   . Obesity   . Morbid obesity    Past Surgical History  Procedure Laterality Date  . Cholecystectomy    . Tonsillectomy    . Tooth extraction  10/28/2011    Procedure: DENTAL RESTORATION/EXTRACTIONS;  Surgeon: [name], DDS;  Location: [facility] OR;  Service:  Oral Surgery;;  . Dental surgery     Family History  Problem Relation Age of Onset  . Depression Mother   . Hypertension Sister   . Diabetes Paternal Uncle   . Anxiety disorder Paternal Uncle    History  Substance Use Topics  . Smoking status: Former Smoker -- 0.50 packs/day for 8 years    Types: Cigarettes    Quit date: 04/25/2011  . Smokeless tobacco: Never Used  . Alcohol Use: No     Comment: occasional    OB History   Grav Para Term Preterm Abortions TAB SAB Ect Mult Living   1    1  1         Review of Systems  Gastrointestinal: Positive for abdominal pain.  All other systems reviewed and are negative.    Allergies  Bee venom; Nutritional supplements; Penicillins; and Adhesive  Home Medications   Current Outpatient Rx  Name  Route  Sig  Dispense  Refill  . acetaminophen (TYLENOL) 500 MG tablet   Oral   Take 1,000 mg by mouth every 6 (six) hours as needed. pain         . albuterol (PROVENTIL HFA;VENTOLIN HFA) 108 (90 BASE) MCG/ACT inhaler   Inhalation   Inhale 1-2 puffs into the lungs every 6 (six) hours as needed for wheezing.   1 Inhaler   0   . benzonatate (TESSALON) 100 MG capsule  Oral   Take 100 mg by mouth 2 (two) times daily as needed for cough.         . benztropine (COGENTIN) 1 MG tablet   Oral   Take 0.5 mg by mouth 2 (two) times daily.          Marland Kitchen buPROPion (WELLBUTRIN XL) 150 MG 24 hr tablet   Oral   Take 150 mg by mouth every morning.         . busPIRone (BUSPAR) 10 MG tablet   Oral   Take 10 mg by mouth 2 (two) times daily. For anxiety         . furosemide (LASIX) 20 MG tablet   Oral   Take 40 mg by mouth daily. For leg edema, must take with potassium and must follow -up with primary provider to evaluate the needed replacement of potassium,         . potassium chloride (K-DUR) 10 MEQ tablet   Oral   Take 1 tablet (10 mEq total) by mouth 2 (two) times daily. For potassium replacement (Lasix causes potassium loss)   60  tablet   0   . Prenatal Vit-Fe Fumarate-FA (PRENATAL MULTIVITAMIN) TABS   Oral   Take 1 tablet by mouth daily at 12 noon.         Marland Kitchen rOPINIRole (REQUIP) 1 MG tablet   Oral   Take 1 mg by mouth at bedtime.         . vitamin E 400 UNIT capsule   Oral   Take 400 Units by mouth daily.         . ziprasidone (GEODON) 60 MG capsule   Oral   Take 60 mg by mouth every evening.           BP 171/86  Pulse 102  Temp(Src) 98.2 F (36.8 C) (Oral)  Resp 16  SpO2 96%  LMP 02/15/2013  Physical Exam  Nursing note and vitals reviewed. Constitutional: She is oriented to person, place, and time. No distress.  Morbidly obese  HENT:  Head: Normocephalic and atraumatic.  Mouth/Throat: Oropharynx is clear and moist.  Eyes: Conjunctivae and EOM are normal. Pupils are equal, round, and reactive to light.  Neck: Normal range of motion. Neck supple. No rigidity.  No meningeal signs  Cardiovascular: Normal rate, regular rhythm and normal heart sounds.   Pulmonary/Chest: Effort normal and breath sounds normal. No respiratory distress. She has no wheezes.  Abdominal: Soft. Bowel sounds are normal. There is no tenderness. There is no guarding.  Abdomen soft, no masses felt  Musculoskeletal: Normal range of motion.  Neurological: She is alert and oriented to person, place, and time. She has normal strength. She displays no tremor. No cranial nerve deficit or sensory deficit. She displays no seizure activity.  No focal neuro deficits appreciated  Skin: Skin is warm and dry. She is not diaphoretic.  Psychiatric: She has a normal mood and affect.    ED Course  Procedures (including critical care time) Labs Review Labs Reviewed  CBC WITH DIFFERENTIAL - Abnormal; Notable for the following:    WBC 13.9 (*)    Neutro Abs 9.3 (*)    All other components within normal limits  COMPREHENSIVE METABOLIC PANEL - Abnormal; Notable for the following:    Total Protein 8.4 (*)    Total Bilirubin 0.2  (*)    All other components within normal limits  URINALYSIS, ROUTINE W REFLEX MICROSCOPIC - Abnormal; Notable for the following:  APPearance CLOUDY (*)    Leukocytes, UA SMALL (*)    All other components within normal limits  URINE MICROSCOPIC-ADD ON - Abnormal; Notable for the following:    Squamous Epithelial / LPF FEW (*)    All other components within normal limits  LIPASE, BLOOD  POCT PREGNANCY, URINE  POCT I-STAT TROPONIN I   Imaging Review No results found.  EKG Interpretation   None       MDM   1. Nausea   2. Headache    Labs as above, mild leukocytosis at 13.9. U/a without signs of infection.  On initial evaluation, pt appears well, NAD, no active vomiting.  No abdominal mass felt on pts exam however this is limited due to pts large body habitus.  Pt has had prior cholecystectomy over 10 years ago, total bili WNL, no evidence of pancreatitis.  I do not feel u/s will provide any further information and CT cannot be obtained as pts weight exceeds table limit  Pt given zofran in the ED with good resolution of her nausea, she has tolerated PO without difficulty.  Will refer to GI for further work-up.  May FU with PCP if problems occur between now and appt time.  Discussed plan with pt, she agreed.  Return precautions advised.  Garlon Hatchet, PA-C 02/19/13 [phone]

## 2013-02-18 NOTE — ED Notes (Signed)
Pt reports she feels a mass in right side of abdomen.  No previous work has been done to determine it.  Pt reports she found it a couple of days ago.  Nausea/vomiting present with back pain.

## 2013-02-18 NOTE — ED Notes (Signed)
Family at bedside. 

## 2013-02-18 NOTE — ED Notes (Signed)
PA at bedside.

## 2013-02-18 NOTE — ED Notes (Signed)
Pt reports having a mass to RUQ, abd pain, nausea and black tarry diarrhea for several days. No acute distress noted at triage.

## 2013-02-21 NOTE — ED Provider Notes (Signed)
Medical screening examination/treatment/procedure(s) were performed by non-physician practitioner and as supervising physician I was immediately available for consultation/collaboration.  EKG Interpretation   None         Toi Stelly B. Lyam Provencio, MD 02/21/13 0717 

## 2013-04-19 DIAGNOSIS — G2581 Restless legs syndrome: Secondary | ICD-10-CM | POA: Diagnosis not present

## 2013-04-19 DIAGNOSIS — E7800 Pure hypercholesterolemia, unspecified: Secondary | ICD-10-CM | POA: Diagnosis not present

## 2013-04-19 DIAGNOSIS — R609 Edema, unspecified: Secondary | ICD-10-CM | POA: Diagnosis not present

## 2013-04-19 DIAGNOSIS — R631 Polydipsia: Secondary | ICD-10-CM | POA: Diagnosis not present

## 2013-04-19 DIAGNOSIS — M549 Dorsalgia, unspecified: Secondary | ICD-10-CM | POA: Diagnosis not present

## 2013-04-19 DIAGNOSIS — R7309 Other abnormal glucose: Secondary | ICD-10-CM | POA: Diagnosis not present

## 2013-04-22 DIAGNOSIS — N926 Irregular menstruation, unspecified: Secondary | ICD-10-CM | POA: Diagnosis not present

## 2013-04-27 DIAGNOSIS — M542 Cervicalgia: Secondary | ICD-10-CM | POA: Diagnosis not present

## 2013-04-27 DIAGNOSIS — M999 Biomechanical lesion, unspecified: Secondary | ICD-10-CM | POA: Diagnosis not present

## 2013-04-27 DIAGNOSIS — M9981 Other biomechanical lesions of cervical region: Secondary | ICD-10-CM | POA: Diagnosis not present

## 2013-04-28 ENCOUNTER — Ambulatory Visit (INDEPENDENT_AMBULATORY_CARE_PROVIDER_SITE_OTHER): Payer: Medicare Other | Admitting: Gastroenterology

## 2013-04-28 ENCOUNTER — Encounter: Payer: Self-pay | Admitting: Gastroenterology

## 2013-04-28 VITALS — BP 148/93 | HR 108 | Temp 97.6°F | Ht 63.0 in | Wt >= 6400 oz

## 2013-04-28 DIAGNOSIS — R198 Other specified symptoms and signs involving the digestive system and abdomen: Secondary | ICD-10-CM

## 2013-04-28 DIAGNOSIS — R1314 Dysphagia, pharyngoesophageal phase: Secondary | ICD-10-CM | POA: Diagnosis not present

## 2013-04-28 DIAGNOSIS — K219 Gastro-esophageal reflux disease without esophagitis: Secondary | ICD-10-CM

## 2013-04-28 DIAGNOSIS — R194 Change in bowel habit: Secondary | ICD-10-CM | POA: Insufficient documentation

## 2013-04-28 DIAGNOSIS — R1319 Other dysphagia: Secondary | ICD-10-CM | POA: Insufficient documentation

## 2013-04-28 DIAGNOSIS — R1013 Epigastric pain: Secondary | ICD-10-CM | POA: Insufficient documentation

## 2013-04-28 DIAGNOSIS — R1310 Dysphagia, unspecified: Secondary | ICD-10-CM | POA: Insufficient documentation

## 2013-04-28 MED ORDER — PANTOPRAZOLE SODIUM 40 MG PO TBEC: 40.0000 mg | DELAYED_RELEASE_TABLET | Freq: Every day | ORAL | Status: DC

## 2013-04-28 MED ORDER — LINACLOTIDE 290 MCG PO CAPS: 290.0000 ug | ORAL_CAPSULE | Freq: Every day | ORAL | Status: DC

## 2013-04-28 NOTE — Patient Instructions (Signed)
1. Start pantoprazole one daily before breakfast for acid reflux. 2. Start Linzess one daily on empty stomach for constipation. You may have diarrhea for the few week or so until bowels stabilize. Drink plenty of fluids. Call if excessive or does not improve and we can change the dose. 3. I will discuss upper endoscopy with Dr. Oneida Alar and let you know the plan.
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
## 2013-04-28 NOTE — Assessment & Plan Note (Signed)
Alternating constipation with occasional loose stool. Symptoms negative for the past few months. Family history of colon cancer and a second degree relative at an advanced age, does not increase her personal risk. We discussed colonoscopy for bowel habit change but she is adamantly against it. We will start Linzess 263mcg daily. If not significant improvement, then patient states she may consider further workup. No plans for Hemoccult at this time especially in light of upper GI symptoms which may sway the results. We will continue to monitor status.

## 2013-04-28 NOTE — Progress Notes (Signed)
cc'd to pcp 

## 2013-04-28 NOTE — Progress Notes (Signed)
LMOM to call.

## 2013-04-28 NOTE — Progress Notes (Signed)
Please let patient know, I discuss with Dr. Oneida Alar. She does not feel comfortable sedating her at [facility] due to her BMI (size). Per Dr. Oneida Alar, she is too high risk to be done locally.   She recommends EGD/dilation at [facility] and make appointment with Dr. Peggye Form specifically.

## 2013-04-28 NOTE — Assessment & Plan Note (Signed)
38 year old morbidly obese female with complaints of nausea, heartburn, esophageal dysphagia. Remote EGD unremarkable outside of hiatal hernia back in 2007. Complains of several month history of progressive symptoms. She has epigastric tenderness on exam. Labs back in November during ER evaluation for similar symptoms unremarkable except for mild leukocytosis. CT scan was not obtained at that time due to her exceeding the maximum weight limit for the CT scanner at [facility]. Differential diagnosis includes GERD, gastritis, peptic ulcer disease +/- esophageal stricture. She would benefit from an upper endoscopy with dilation. We'll discuss further with Dr. Oneida Alar given her current BMI. Would recommend deep sedation given inadequate conscious sedation in the past.  Start pantoprazole 40 mg daily. Discussed antireflux measures. Patient would benefit from ongoing weight loss, discussed at length.

## 2013-04-29 ENCOUNTER — Other Ambulatory Visit: Payer: Self-pay | Admitting: Gastroenterology

## 2013-04-29 DIAGNOSIS — R1319 Other dysphagia: Secondary | ICD-10-CM

## 2013-04-29 DIAGNOSIS — R1013 Epigastric pain: Secondary | ICD-10-CM

## 2013-04-29 NOTE — Progress Notes (Signed)
Referral has been made to [facility] to Dr. Carlton Adam for EGD/ED

## 2013-04-29 NOTE — Progress Notes (Signed)
Pt returned call and is aware that referral will be made.

## 2013-05-07 DIAGNOSIS — N920 Excessive and frequent menstruation with regular cycle: Secondary | ICD-10-CM | POA: Diagnosis not present

## 2013-05-10 DIAGNOSIS — F332 Major depressive disorder, recurrent severe without psychotic features: Secondary | ICD-10-CM | POA: Diagnosis not present

## 2013-05-31 NOTE — Progress Notes (Signed)
EGD/ED is scheduled at [facility] on March 11th at 12:30

## 2013-06-09 DIAGNOSIS — Z5181 Encounter for therapeutic drug level monitoring: Secondary | ICD-10-CM | POA: Diagnosis not present

## 2013-06-09 DIAGNOSIS — I517 Cardiomegaly: Secondary | ICD-10-CM | POA: Diagnosis not present

## 2013-06-16 DIAGNOSIS — K200 Eosinophilic esophagitis: Secondary | ICD-10-CM | POA: Diagnosis not present

## 2013-06-16 DIAGNOSIS — Z880 Allergy status to penicillin: Secondary | ICD-10-CM | POA: Diagnosis not present

## 2013-06-16 DIAGNOSIS — E162 Hypoglycemia, unspecified: Secondary | ICD-10-CM | POA: Diagnosis not present

## 2013-06-16 DIAGNOSIS — Z91040 Latex allergy status: Secondary | ICD-10-CM | POA: Diagnosis not present

## 2013-06-16 DIAGNOSIS — Z91038 Other insect allergy status: Secondary | ICD-10-CM | POA: Diagnosis not present

## 2013-06-16 DIAGNOSIS — G4733 Obstructive sleep apnea (adult) (pediatric): Secondary | ICD-10-CM | POA: Diagnosis not present

## 2013-06-16 DIAGNOSIS — R1310 Dysphagia, unspecified: Secondary | ICD-10-CM | POA: Diagnosis not present

## 2013-06-16 DIAGNOSIS — Z87891 Personal history of nicotine dependence: Secondary | ICD-10-CM | POA: Diagnosis not present

## 2013-06-16 DIAGNOSIS — G2581 Restless legs syndrome: Secondary | ICD-10-CM | POA: Diagnosis not present

## 2013-06-16 DIAGNOSIS — F341 Dysthymic disorder: Secondary | ICD-10-CM | POA: Diagnosis not present

## 2013-06-16 DIAGNOSIS — K319 Disease of stomach and duodenum, unspecified: Secondary | ICD-10-CM | POA: Diagnosis not present

## 2013-06-16 DIAGNOSIS — K219 Gastro-esophageal reflux disease without esophagitis: Secondary | ICD-10-CM | POA: Diagnosis not present

## 2013-06-16 HISTORY — PX: ESOPHAGOGASTRODUODENOSCOPY: SHX1529

## 2013-06-22 DIAGNOSIS — M25569 Pain in unspecified knee: Secondary | ICD-10-CM | POA: Diagnosis not present

## 2013-06-22 DIAGNOSIS — M25529 Pain in unspecified elbow: Secondary | ICD-10-CM | POA: Diagnosis not present

## 2013-06-24 ENCOUNTER — Telehealth: Payer: Self-pay | Admitting: Gastroenterology

## 2013-06-24 NOTE — Telephone Encounter (Signed)
LMOM to call.

## 2013-06-24 NOTE — Telephone Encounter (Signed)
Pt returned call and said she is having some nausea and would like something sent to the pharmacy if possible.  Please advise!

## 2013-06-24 NOTE — Telephone Encounter (Signed)
Pt called to make appointment with [place] to go over her results from where we sent her to [facility]. She is aware of OV with [facility] on 4/16 at 130 and then said that she was having nausea and wanted to speak with nurse. Please call her back at [phone]

## 2013-06-25 DIAGNOSIS — M542 Cervicalgia: Secondary | ICD-10-CM | POA: Diagnosis not present

## 2013-06-25 DIAGNOSIS — M9981 Other biomechanical lesions of cervical region: Secondary | ICD-10-CM | POA: Diagnosis not present

## 2013-06-25 DIAGNOSIS — M999 Biomechanical lesion, unspecified: Secondary | ICD-10-CM | POA: Diagnosis not present

## 2013-06-25 MED ORDER — PROMETHAZINE HCL 25 MG PO TABS: 25.0000 mg | ORAL_TABLET | Freq: Four times a day (QID) | ORAL | Status: DC | PRN

## 2013-06-25 NOTE — Telephone Encounter (Signed)
Phenergan sent in

## 2013-06-25 NOTE — Telephone Encounter (Signed)
Routing to Neil Crouch, [place], since Vicente Males is off today.

## 2013-06-25 NOTE — Addendum Note (Signed)
Addended by: Mahala Menghini on: 06/25/2013 12:22 PM   Modules accepted: Orders

## 2013-06-28 NOTE — Telephone Encounter (Signed)
LMOM for pt that Rx has been sent in.

## 2013-07-01 DIAGNOSIS — M1710 Unilateral primary osteoarthritis, unspecified knee: Secondary | ICD-10-CM | POA: Diagnosis not present

## 2013-07-01 DIAGNOSIS — M25569 Pain in unspecified knee: Secondary | ICD-10-CM | POA: Diagnosis not present

## 2013-07-01 DIAGNOSIS — M12329 Palindromic rheumatism, unspecified elbow: Secondary | ICD-10-CM | POA: Diagnosis not present

## 2013-07-21 DIAGNOSIS — L02519 Cutaneous abscess of unspecified hand: Secondary | ICD-10-CM | POA: Diagnosis not present

## 2013-07-22 ENCOUNTER — Other Ambulatory Visit: Payer: Self-pay | Admitting: Gastroenterology

## 2013-07-22 ENCOUNTER — Ambulatory Visit (INDEPENDENT_AMBULATORY_CARE_PROVIDER_SITE_OTHER): Payer: Medicare Other | Admitting: Gastroenterology

## 2013-07-22 ENCOUNTER — Encounter: Payer: Self-pay | Admitting: Gastroenterology

## 2013-07-22 VITALS — HR 76 | Temp 98.1°F | Ht 63.0 in | Wt >= 6400 oz

## 2013-07-22 DIAGNOSIS — K219 Gastro-esophageal reflux disease without esophagitis: Secondary | ICD-10-CM

## 2013-07-22 DIAGNOSIS — K200 Eosinophilic esophagitis: Secondary | ICD-10-CM

## 2013-07-22 NOTE — Patient Instructions (Signed)
1. Please call if you have more difficulty swallowing or increased pain. 2. Referral to allergist as discussed. 3. Office visit with Dr. Oneida Alar in 2-3 months.

## 2013-07-22 NOTE — Progress Notes (Signed)
Primary Care Physician: Lynne Logan, MD  Primary Gastroenterologist:  Barney Drain, MD  Chief Complaint  Patient presents with  . Follow-up    HPI: Heather [name] is a 38 y.o. female here for follow up. Had EGD at [facility] that showed Eosinophilic esophagitis and reactive gastropathy. She was told to follow up here. No meds started. She has had swallowing difficulties. Cut back on bread and pasta due to swallowing issues. BMs more regular. Takes Activia daily. Still with some swallowing issues and has to wash food down. Dry stuff and meats are the worst. No heartburn. Feels swallow problems tolerable and refuses fluticasone. Refuses colonoscopy.   Current Outpatient Prescriptions  Medication Sig Dispense Refill  . acetaminophen (TYLENOL) 500 MG tablet Take 1,000 mg by mouth every 6 (six) hours as needed. pain      . albuterol (PROVENTIL HFA;VENTOLIN HFA) 108 (90 BASE) MCG/ACT inhaler Inhale 1-2 puffs into the lungs every 6 (six) hours as needed for wheezing.  1 Inhaler  0  . benzonatate (TESSALON) 100 MG capsule Take 100 mg by mouth 2 (two) times daily as needed for cough.      . benztropine (COGENTIN) 1 MG tablet Take 0.5 mg by mouth 2 (two) times daily.       Marland Kitchen buPROPion (WELLBUTRIN XL) 150 MG 24 hr tablet Take 150 mg by mouth every morning.      . busPIRone (BUSPAR) 10 MG tablet Take 10 mg by mouth 2 (two) times daily. For anxiety      . furosemide (LASIX) 20 MG tablet Take 40 mg by mouth daily. For leg edema, must take with potassium and must follow -up with primary provider to evaluate the needed replacement of potassium,      . pantoprazole (PROTONIX) 40 MG tablet Take 1 tablet (40 mg total) by mouth daily.  30 tablet  11  . potassium chloride (K-DUR) 10 MEQ tablet Take 1 tablet (10 mEq total) by mouth 2 (two) times daily. For potassium replacement (Lasix causes potassium loss)  60 tablet  0  . Prenatal Vit-Fe Fumarate-FA (PRENATAL MULTIVITAMIN) TABS Take 1 tablet by  mouth daily at 12 noon.      . promethazine (PHENERGAN) 25 MG tablet Take 1 tablet (25 mg total) by mouth every 6 (six) hours as needed for nausea or vomiting.  30 tablet  0  . rOPINIRole (REQUIP) 1 MG tablet Take 1 mg by mouth at bedtime.      . vitamin E 400 UNIT capsule Take 400 Units by mouth daily.      . ziprasidone (GEODON) 60 MG capsule Take 60 mg by mouth every evening.       . Linaclotide (LINZESS) 290 MCG CAPS capsule Take 1 capsule (290 mcg total) by mouth daily.  30 capsule  11   No current facility-administered medications for this visit.    Allergies as of 07/22/2013 - Review Complete 07/22/2013  Allergen Reaction Noted  . Bee venom Shortness Of Breath 10/28/2011  . Nutritional supplements Anaphylaxis 06/29/2011  . Penicillins Anaphylaxis 05/12/2006  . Adhesive [tape] Rash 07/31/2011    ROS:  General: Negative for anorexia, weight loss, fever, chills, fatigue, weakness. ENT: Negative for hoarseness, difficulty swallowing , nasal congestion. CV: Negative for chest pain, angina, palpitations, dyspnea on exertion, peripheral edema.  Respiratory: Negative for dyspnea at rest, dyspnea on exertion, cough, sputum, wheezing.  GI: See history of present illness. GU:  Negative for dysuria, hematuria, urinary incontinence, urinary frequency,  nocturnal urination.  Endo: Negative for unusual weight change.    Physical Examination:   Pulse 76  Temp(Src) 98.1 F (36.7 C) (Oral)  Ht 5\' 3"  (1.6 m)  Wt 451 lb (204.572 kg)  BMI 79.91 kg/m2  LMP 06/04/2013  General: Morbidly obese,in no acute distress.  Eyes: No icterus. Mouth: Oropharyngeal mucosa moist and pink , no lesions erythema or exudate. Lungs: Clear to auscultation bilaterally.  Heart: Regular rate and rhythm, no murmurs rubs or gallops.  Abdomen: Bowel sounds are normal, nontender, nondistended.  Extremities: No lower extremity edema. No clubbing or deformities. Neuro: Alert and oriented x 4   Skin: Warm and dry,  no jaundice.   Psych: Alert and cooperative, normal mood and affect.

## 2013-07-25 ENCOUNTER — Encounter: Payer: Self-pay | Admitting: Gastroenterology

## 2013-07-25 NOTE — Assessment & Plan Note (Signed)
H/O GERD and dysphagia. Recent EGD with diagnosis of EE. Patient not interested in fluticasone at this time. Recommend seeing allergist for work up of food allergies.   Continue pantoprazole.

## 2013-07-26 NOTE — Progress Notes (Signed)
cc'd to pcp 

## 2013-07-27 DIAGNOSIS — Z6841 Body Mass Index (BMI) 40.0 and over, adult: Secondary | ICD-10-CM | POA: Diagnosis not present

## 2013-07-27 DIAGNOSIS — N920 Excessive and frequent menstruation with regular cycle: Secondary | ICD-10-CM | POA: Diagnosis not present

## 2013-07-27 DIAGNOSIS — F39 Unspecified mood [affective] disorder: Secondary | ICD-10-CM | POA: Diagnosis not present

## 2013-07-29 DIAGNOSIS — M76899 Other specified enthesopathies of unspecified lower limb, excluding foot: Secondary | ICD-10-CM | POA: Diagnosis not present

## 2013-07-29 DIAGNOSIS — M1710 Unilateral primary osteoarthritis, unspecified knee: Secondary | ICD-10-CM | POA: Diagnosis not present

## 2013-08-02 DIAGNOSIS — F333 Major depressive disorder, recurrent, severe with psychotic symptoms: Secondary | ICD-10-CM | POA: Diagnosis not present

## 2013-08-24 DIAGNOSIS — J45909 Unspecified asthma, uncomplicated: Secondary | ICD-10-CM | POA: Diagnosis not present

## 2013-08-24 DIAGNOSIS — J309 Allergic rhinitis, unspecified: Secondary | ICD-10-CM | POA: Diagnosis not present

## 2013-09-10 DIAGNOSIS — F39 Unspecified mood [affective] disorder: Secondary | ICD-10-CM | POA: Diagnosis not present

## 2013-09-10 DIAGNOSIS — N949 Unspecified condition associated with female genital organs and menstrual cycle: Secondary | ICD-10-CM | POA: Diagnosis not present

## 2013-09-29 ENCOUNTER — Encounter: Payer: Self-pay | Admitting: Gastroenterology

## 2013-10-12 DIAGNOSIS — J45909 Unspecified asthma, uncomplicated: Secondary | ICD-10-CM | POA: Diagnosis not present

## 2013-10-12 DIAGNOSIS — J309 Allergic rhinitis, unspecified: Secondary | ICD-10-CM | POA: Diagnosis not present

## 2013-10-14 DIAGNOSIS — M9981 Other biomechanical lesions of cervical region: Secondary | ICD-10-CM | POA: Diagnosis not present

## 2013-10-14 DIAGNOSIS — M542 Cervicalgia: Secondary | ICD-10-CM | POA: Diagnosis not present

## 2013-10-14 DIAGNOSIS — M999 Biomechanical lesion, unspecified: Secondary | ICD-10-CM | POA: Diagnosis not present

## 2013-10-20 DIAGNOSIS — F332 Major depressive disorder, recurrent severe without psychotic features: Secondary | ICD-10-CM | POA: Diagnosis not present

## 2013-11-17 ENCOUNTER — Encounter: Payer: Self-pay | Admitting: Gastroenterology

## 2013-11-17 ENCOUNTER — Ambulatory Visit (INDEPENDENT_AMBULATORY_CARE_PROVIDER_SITE_OTHER): Payer: Medicare Other | Admitting: Gastroenterology

## 2013-11-17 VITALS — BP 144/90 | HR 93 | Temp 98.7°F | Resp 20 | Ht 63.0 in | Wt >= 6400 oz

## 2013-11-17 DIAGNOSIS — K219 Gastro-esophageal reflux disease without esophagitis: Secondary | ICD-10-CM

## 2013-11-17 DIAGNOSIS — R198 Other specified symptoms and signs involving the digestive system and abdomen: Secondary | ICD-10-CM

## 2013-11-17 DIAGNOSIS — R194 Change in bowel habit: Secondary | ICD-10-CM

## 2013-11-17 DIAGNOSIS — K200 Eosinophilic esophagitis: Secondary | ICD-10-CM | POA: Diagnosis not present

## 2013-11-17 MED ORDER — ALIGN 4 MG PO CAPS: 4.0000 mg | ORAL_CAPSULE | Freq: Every day | ORAL | Status: DC

## 2013-11-17 NOTE — Progress Notes (Signed)
Primary Care Physician: [facility]  Primary Gastroenterologist:  Barney Drain, MD   Chief Complaint  Patient presents with  . Follow-up    HPI: Heather [name] is a 38 y.o. female here for follow up. Last seen 07/22/13. EGD at [facility] earlier this year showed EE and reactive gastropathy. No meds provided. Offered fluticasone at last OV but patient declined. Sent to allergist, Dr. Ishmael Holter. Per patient, no food allergies, just environmental ones. Given albuterol and singulair. Not treated for EE.   Weight down 40 pounds in the past one year. Five days a week more active. Decreased dietary intake. Off cogentin now. BMs more regular but still with hard stool but some loose stool too. Some intermittent abdominal cramping. Goes every day. ?melena recently. No rectal bleeding. Doing well on protonix. Swallowing better. Refuses colonoscopy as previously addressed.   Current Outpatient Prescriptions  Medication Sig Dispense Refill  . acetaminophen (TYLENOL) 500 MG tablet Take 1,000 mg by mouth every 6 (six) hours as needed. pain      . albuterol (PROVENTIL HFA;VENTOLIN HFA) 108 (90 BASE) MCG/ACT inhaler Inhale 1-2 puffs into the lungs every 6 (six) hours as needed for wheezing.  1 Inhaler  0  . benzonatate (TESSALON) 100 MG capsule Take 100 mg by mouth 2 (two) times daily as needed for cough.      Marland Kitchen buPROPion (WELLBUTRIN XL) 150 MG 24 hr tablet Take 150 mg by mouth every morning.      . busPIRone (BUSPAR) 10 MG tablet Take 10 mg by mouth 2 (two) times daily. For anxiety      . furosemide (LASIX) 20 MG tablet Take 40 mg by mouth daily. For leg edema, must take with potassium and must follow -up with primary provider to evaluate the needed replacement of potassium,      . montelukast (SINGULAIR) 10 MG tablet Take 10 mg by mouth at bedtime.      . pantoprazole (PROTONIX) 40 MG tablet Take 1 tablet (40 mg total) by mouth daily.  30 tablet  11  . potassium chloride  (K-DUR) 10 MEQ tablet Take 1 tablet (10 mEq total) by mouth 2 (two) times daily. For potassium replacement (Lasix causes potassium loss)  60 tablet  0  . Prenatal Vit-Fe Fumarate-FA (PRENATAL MULTIVITAMIN) TABS Take 1 tablet by mouth daily at 12 noon.      . promethazine (PHENERGAN) 25 MG tablet Take 1 tablet (25 mg total) by mouth every 6 (six) hours as needed for nausea or vomiting.  30 tablet  0  . rOPINIRole (REQUIP) 1 MG tablet Take 1 mg by mouth at bedtime.      . vitamin E 400 UNIT capsule Take 400 Units by mouth daily.      . ziprasidone (GEODON) 60 MG capsule Take 60 mg by mouth every evening.        No current facility-administered medications for this visit.    Allergies as of 11/17/2013 - Review Complete 11/17/2013  Allergen Reaction Noted  . Bee venom Shortness Of Breath 10/28/2011  . Nutritional supplements Anaphylaxis 06/29/2011  . Penicillins Anaphylaxis 05/12/2006  . Adhesive [tape] Rash 07/31/2011    ROS:  General: Negative for anorexia, unintentional weight loss, fever, chills, fatigue, weakness. ENT: Negative for hoarseness, difficulty swallowing , nasal congestion. CV: Negative for chest pain, angina, palpitations, dyspnea on exertion, peripheral edema.  Respiratory: Negative for dyspnea at rest, dyspnea on exertion, cough, sputum, wheezing.  GI: See history of  present illness. GU:  Negative for dysuria, hematuria, urinary incontinence, urinary frequency, nocturnal urination.  Endo: Negative for unusual weight change.    Physical Examination:   BP 144/90  Pulse 93  Temp(Src) 98.7 F (37.1 C) (Oral)  Resp 20  Ht 5\' 3"  (1.6 m)  Wt 435 lb (197.315 kg)  BMI 77.08 kg/m2  General: Well-nourished, well-developed,morbidly obese, in no acute distress.  Eyes: No icterus. Mouth: Oropharyngeal mucosa moist and pink , no lesions erythema or exudate. Lungs: Clear to auscultation bilaterally.  Heart: Regular rate and rhythm, no murmurs rubs or gallops.  Abdomen:  Bowel sounds are normal, nontender, nondistended, exam limited due to body habitus.    Extremities: 1+PE ankles bilaterally. No clubbing or deformities. Neuro: Alert and oriented x 4   Skin: Warm and dry, no jaundice.   Psych: Alert and cooperative, normal mood and affect.

## 2013-11-17 NOTE — Assessment & Plan Note (Signed)
GERD and dysphagia improved. Request records from allergist for review. Continue protonix daily. Antireflux measures. Congratulated her on her weight loss efforts.

## 2013-11-17 NOTE — Assessment & Plan Note (Signed)
Continues with alternating loose stools and constipation, now more regular BMs. Recommend trial of align once daily. Discussed FODMAP diet to try and reduce bloating. ?melena recently. Check CBC, TSH, TTG/IgA.  Ov in 6 months for follow. Call sooner if needed.

## 2013-11-17 NOTE — Patient Instructions (Signed)
1. Trial of Align (probiotic) one daily for 3 weeks. Samples provided.  2. FODMAP diet, see if there is any foods you consume frequently and eliminate one at a time to see if you bloating, cramps improve. 3. Please have your labs done. 4. I will review records from Dr. Ishmael Holter' office.  5. Office visit in 6 months.

## 2013-11-19 LAB — CBC WITH DIFFERENTIAL/PLATELET
Basophils Absolute: 0 10*3/uL (ref 0.0–0.1)
Basophils Relative: 0 % (ref 0–1)
EOS PCT: 3 % (ref 0–5)
Eosinophils Absolute: 0.3 10*3/uL (ref 0.0–0.7)
HEMATOCRIT: 38.8 % (ref 36.0–46.0)
Hemoglobin: 12.6 g/dL (ref 12.0–15.0)
LYMPHS ABS: 3.3 10*3/uL (ref 0.7–4.0)
LYMPHS PCT: 31 % (ref 12–46)
MCH: 26.9 pg (ref 26.0–34.0)
MCHC: 32.5 g/dL (ref 30.0–36.0)
MCV: 82.9 fL (ref 78.0–100.0)
Monocytes Absolute: 0.5 10*3/uL (ref 0.1–1.0)
Monocytes Relative: 5 % (ref 3–12)
Neutro Abs: 6.6 10*3/uL (ref 1.7–7.7)
Neutrophils Relative %: 61 % (ref 43–77)
Platelets: 410 10*3/uL — ABNORMAL HIGH (ref 150–400)
RBC: 4.68 MIL/uL (ref 3.87–5.11)
RDW: 14.8 % (ref 11.5–15.5)
WBC: 10.8 10*3/uL — AB (ref 4.0–10.5)

## 2013-11-20 LAB — IGA: IGA: 278 mg/dL (ref 69–380)

## 2013-11-20 LAB — TSH: TSH: 2.476 u[IU]/mL (ref 0.350–4.500)

## 2013-11-22 LAB — TISSUE TRANSGLUTAMINASE, IGA: Tissue Transglutaminase Ab, IgA: 7.6 U/mL (ref <20)

## 2013-11-22 NOTE — Progress Notes (Signed)
cc'd to pcp 

## 2013-12-01 NOTE — Progress Notes (Signed)
Quick Note:  Celiac screen negative. TSH normal. Minimally elevated WBC/Platelets but better than before.   Offer repeat CBC in 2 months. OV 6 months as previously recommended. ______

## 2013-12-02 DIAGNOSIS — E876 Hypokalemia: Secondary | ICD-10-CM | POA: Diagnosis not present

## 2013-12-02 DIAGNOSIS — D473 Essential (hemorrhagic) thrombocythemia: Secondary | ICD-10-CM | POA: Diagnosis not present

## 2013-12-02 DIAGNOSIS — R609 Edema, unspecified: Secondary | ICD-10-CM | POA: Diagnosis not present

## 2013-12-02 DIAGNOSIS — G44209 Tension-type headache, unspecified, not intractable: Secondary | ICD-10-CM | POA: Diagnosis not present

## 2013-12-02 DIAGNOSIS — Z6841 Body Mass Index (BMI) 40.0 and over, adult: Secondary | ICD-10-CM | POA: Diagnosis not present

## 2013-12-02 DIAGNOSIS — G2581 Restless legs syndrome: Secondary | ICD-10-CM | POA: Diagnosis not present

## 2013-12-08 ENCOUNTER — Other Ambulatory Visit: Payer: Self-pay | Admitting: Gastroenterology

## 2013-12-08 DIAGNOSIS — M542 Cervicalgia: Secondary | ICD-10-CM | POA: Diagnosis not present

## 2013-12-08 DIAGNOSIS — D72829 Elevated white blood cell count, unspecified: Secondary | ICD-10-CM

## 2013-12-08 DIAGNOSIS — M5430 Sciatica, unspecified side: Secondary | ICD-10-CM | POA: Diagnosis not present

## 2013-12-08 DIAGNOSIS — IMO0002 Reserved for concepts with insufficient information to code with codable children: Secondary | ICD-10-CM | POA: Diagnosis not present

## 2013-12-08 DIAGNOSIS — M9981 Other biomechanical lesions of cervical region: Secondary | ICD-10-CM | POA: Diagnosis not present

## 2013-12-09 NOTE — Progress Notes (Signed)
PATIENT NIC'D FOR 6 MONTHS

## 2013-12-20 DIAGNOSIS — R51 Headache: Secondary | ICD-10-CM | POA: Diagnosis not present

## 2013-12-20 DIAGNOSIS — R109 Unspecified abdominal pain: Secondary | ICD-10-CM | POA: Diagnosis not present

## 2013-12-20 DIAGNOSIS — R110 Nausea: Secondary | ICD-10-CM | POA: Diagnosis not present

## 2013-12-20 DIAGNOSIS — R42 Dizziness and giddiness: Secondary | ICD-10-CM | POA: Diagnosis not present

## 2013-12-31 DIAGNOSIS — Z23 Encounter for immunization: Secondary | ICD-10-CM | POA: Diagnosis not present

## 2014-01-11 DIAGNOSIS — R102 Pelvic and perineal pain: Secondary | ICD-10-CM | POA: Diagnosis not present

## 2014-01-11 DIAGNOSIS — Z3049 Encounter for surveillance of other contraceptives: Secondary | ICD-10-CM | POA: Diagnosis not present

## 2014-01-11 DIAGNOSIS — N939 Abnormal uterine and vaginal bleeding, unspecified: Secondary | ICD-10-CM | POA: Diagnosis not present

## 2014-01-11 DIAGNOSIS — F309 Manic episode, unspecified: Secondary | ICD-10-CM | POA: Diagnosis not present

## 2014-01-11 DIAGNOSIS — R109 Unspecified abdominal pain: Secondary | ICD-10-CM | POA: Diagnosis not present

## 2014-01-11 DIAGNOSIS — Z6841 Body Mass Index (BMI) 40.0 and over, adult: Secondary | ICD-10-CM | POA: Diagnosis not present

## 2014-01-18 DIAGNOSIS — F332 Major depressive disorder, recurrent severe without psychotic features: Secondary | ICD-10-CM | POA: Diagnosis not present

## 2014-01-19 ENCOUNTER — Other Ambulatory Visit: Payer: Self-pay

## 2014-01-19 DIAGNOSIS — D72829 Elevated white blood cell count, unspecified: Secondary | ICD-10-CM

## 2014-01-19 DIAGNOSIS — R102 Pelvic and perineal pain: Secondary | ICD-10-CM | POA: Diagnosis not present

## 2014-01-19 DIAGNOSIS — N949 Unspecified condition associated with female genital organs and menstrual cycle: Secondary | ICD-10-CM | POA: Diagnosis not present

## 2014-01-19 DIAGNOSIS — K921 Melena: Secondary | ICD-10-CM

## 2014-01-25 ENCOUNTER — Telehealth: Payer: Self-pay | Admitting: Gastroenterology

## 2014-01-25 ENCOUNTER — Other Ambulatory Visit: Payer: Self-pay

## 2014-01-25 DIAGNOSIS — R195 Other fecal abnormalities: Secondary | ICD-10-CM | POA: Diagnosis not present

## 2014-01-25 DIAGNOSIS — K921 Melena: Secondary | ICD-10-CM

## 2014-01-25 DIAGNOSIS — R05 Cough: Secondary | ICD-10-CM | POA: Diagnosis not present

## 2014-01-25 DIAGNOSIS — J309 Allergic rhinitis, unspecified: Secondary | ICD-10-CM | POA: Diagnosis not present

## 2014-01-25 DIAGNOSIS — R0602 Shortness of breath: Secondary | ICD-10-CM | POA: Diagnosis not present

## 2014-01-25 LAB — CBC WITH DIFFERENTIAL/PLATELET
BASOS ABS: 0 10*3/uL (ref 0.0–0.1)
Basophils Relative: 0 % (ref 0–1)
Eosinophils Absolute: 0.4 10*3/uL (ref 0.0–0.7)
Eosinophils Relative: 3 % (ref 0–5)
HEMATOCRIT: 38.8 % (ref 36.0–46.0)
Hemoglobin: 12.1 g/dL (ref 12.0–15.0)
Lymphocytes Relative: 24 % (ref 12–46)
Lymphs Abs: 3 10*3/uL (ref 0.7–4.0)
MCH: 27 pg (ref 26.0–34.0)
MCHC: 31.2 g/dL (ref 30.0–36.0)
MCV: 86.6 fL (ref 78.0–100.0)
Monocytes Absolute: 0.6 10*3/uL (ref 0.1–1.0)
Monocytes Relative: 5 % (ref 3–12)
NEUTROS ABS: 8.4 10*3/uL — AB (ref 1.7–7.7)
Neutrophils Relative %: 68 % (ref 43–77)
PLATELETS: 397 10*3/uL (ref 150–400)
RBC: 4.48 MIL/uL (ref 3.87–5.11)
RDW: 15.3 % (ref 11.5–15.5)
WBC: 12.4 10*3/uL — AB (ref 4.0–10.5)

## 2014-01-25 NOTE — Telephone Encounter (Signed)
LMOM for pt to call and give me more info, she might need to go to the ED, but please call.

## 2014-01-25 NOTE — Telephone Encounter (Signed)
Pt called and said she has had about 3 BM's daily for the last 3 days . They ae not formed but not watery. She said all of them were black.  Routing to Laban Emperor, NP in Leslie's absence.

## 2014-01-25 NOTE — Telephone Encounter (Signed)
Stat CBC.  Is it tarry/sticky?

## 2014-01-25 NOTE — Telephone Encounter (Signed)
Pt called to speak with LSL about her concerns with her stool being black for 3 days. I told her that I would get a message to the nurse and someone would be calling her back. [phone]

## 2014-01-25 NOTE — Telephone Encounter (Signed)
Pt just says the stool is very black and she is not sure if it is sticky. She is not on iron. She is aware to go to the lab for STAT CBC.

## 2014-01-26 NOTE — Telephone Encounter (Signed)
CBC normal without anemia. Needs to have EGD set up with Dr. Oneida Alar due to reports of black stool. Needs urgent OV.

## 2014-01-27 ENCOUNTER — Ambulatory Visit (INDEPENDENT_AMBULATORY_CARE_PROVIDER_SITE_OTHER): Payer: Medicare Other | Admitting: Gastroenterology

## 2014-01-27 ENCOUNTER — Encounter: Payer: Self-pay | Admitting: Gastroenterology

## 2014-01-27 VITALS — BP 158/88 | HR 109 | Temp 98.6°F | Ht 63.0 in | Wt >= 6400 oz

## 2014-01-27 DIAGNOSIS — R1030 Lower abdominal pain, unspecified: Secondary | ICD-10-CM | POA: Diagnosis not present

## 2014-01-27 DIAGNOSIS — K921 Melena: Secondary | ICD-10-CM | POA: Diagnosis not present

## 2014-01-27 NOTE — Patient Instructions (Signed)
1. If you have recurrent black stools, feel lightheaded, go to the nearest ER. If you require another upper endoscopy, you will not be able to have it locally at [facility] due to your increased risk with sedation. 2. Please collect stool and return small tube to our office and the others to the lab. 3. If you have further black stools, you can have your blood work repeated. Take order with you.

## 2014-01-27 NOTE — Progress Notes (Signed)
Primary Care Physician: No primary provider on file.  Primary Gastroenterologist:  Barney Drain, MD   Chief Complaint  Patient presents with  . Abdominal Pain  . Diarrhea    HPI: Heather [name] is a 38 y.o. female here for urgent office visit for complaints of melena. Last seen in office 11/2013. Patient reports having bloody mucous in stool and melena, Sunday, Monday, Tuesday. Has some abdominal cramping and loose stool. Today BM, green. Ate oreo dirt cake several days before the black stool. Denies recent abc, ill contacts.   Has recent MRI pelvis for lower abdominal/pelvic pain. Not aware of results yet. She requested I look at disc she brought. There was no report available. Suggested she discuss results with ordering MD.   Current Outpatient Prescriptions  Medication Sig Dispense Refill  . acetaminophen (TYLENOL) 500 MG tablet Take 1,000 mg by mouth every 6 (six) hours as needed. pain      . benzonatate (TESSALON) 100 MG capsule Take 100 mg by mouth 2 (two) times daily as needed for cough.      Marland Kitchen buPROPion (WELLBUTRIN XL) 150 MG 24 hr tablet Take 150 mg by mouth every morning.      . busPIRone (BUSPAR) 10 MG tablet Take 10 mg by mouth 2 (two) times daily. For anxiety      . furosemide (LASIX) 20 MG tablet Take 40 mg by mouth daily. For leg edema, must take with potassium and must follow -up with primary provider to evaluate the needed replacement of potassium,      . loratadine (CLARITIN) 10 MG tablet Take 10 mg by mouth daily.      . montelukast (SINGULAIR) 10 MG tablet Take 10 mg by mouth at bedtime.      . pantoprazole (PROTONIX) 40 MG tablet Take 1 tablet (40 mg total) by mouth daily.  30 tablet  11  . potassium chloride (K-DUR) 10 MEQ tablet Take 1 tablet (10 mEq total) by mouth 2 (two) times daily. For potassium replacement (Lasix causes potassium loss)  60 tablet  0  . Prenatal Vit-Fe Fumarate-FA (PRENATAL MULTIVITAMIN) TABS Take 1 tablet by mouth daily at 12 noon.       . Probiotic Product (ALIGN) 4 MG CAPS Take 1 capsule (4 mg total) by mouth daily.  21 capsule  0  . promethazine (PHENERGAN) 25 MG tablet Take 1 tablet (25 mg total) by mouth every 6 (six) hours as needed for nausea or vomiting.  30 tablet  0  . rOPINIRole (REQUIP) 1 MG tablet Take 1 mg by mouth at bedtime.      . vitamin E 400 UNIT capsule Take 400 Units by mouth daily.      . ziprasidone (GEODON) 60 MG capsule Take 60 mg by mouth every evening.       Marland Kitchen albuterol (PROVENTIL HFA;VENTOLIN HFA) 108 (90 BASE) MCG/ACT inhaler Inhale 1-2 puffs into the lungs every 6 (six) hours as needed for wheezing.  1 Inhaler  0   No current facility-administered medications for this visit.    Allergies as of 01/27/2014 - Review Complete 01/27/2014  Allergen Reaction Noted  . Bee venom Shortness Of Breath 10/28/2011  . Nutritional supplements Anaphylaxis 06/29/2011  . Penicillins Anaphylaxis 05/12/2006  . Adhesive [tape] Rash 07/31/2011   Past Surgical History  Procedure Laterality Date  . Cholecystectomy    . Tonsillectomy    . Tooth extraction  10/28/2011    Procedure: DENTAL RESTORATION/EXTRACTIONS;  Surgeon: Deborra Medina  Hoyt Koch, DDS;  Location: [facility];  Service: Oral Surgery;;  . Dental surgery    . Esophagogastroduodenoscopy  May 2007    Dr. Gala Romney: Normal esophagus, stomach, D1, D2  . Esophagogastroduodenoscopy  06/16/2013    Dr. Carlton Adam, eosinophilic esophagitis, reactive gastropathy, no esophageal dilation     ROS:  General: Negative for anorexia, weight loss, fever, chills, fatigue, weakness. ENT: Negative for hoarseness, difficulty swallowing , nasal congestion. CV: Negative for chest pain, angina, palpitations, dyspnea on exertion, peripheral edema.  Respiratory: Negative for dyspnea at rest, dyspnea on exertion, cough, sputum, wheezing.  GI: See history of present illness. GU:  Negative for dysuria, hematuria, urinary incontinence, urinary frequency, nocturnal urination. No LMP. Recently had  Nexplanon removed. Endo: Negative for unusual weight change.    Physical Examination:   BP 158/88  Pulse 109  Temp(Src) 98.6 F (37 C) (Oral)  Ht 5\' 3"  (1.6 m)  Wt 439 lb (199.129 kg)  BMI 77.78 kg/m2  General: Well-nourished, well-developed in no acute distress. Morbidly obese. Eyes: No icterus. Mouth: Oropharyngeal mucosa moist and pink , no lesions erythema or exudate. Lungs: Clear to auscultation bilaterally.  Heart: Regular rate and rhythm, no murmurs rubs or gallops.  Abdomen: Bowel sounds are normal, nontender, nondistended, no hepatosplenomegaly or masses, no abdominal bruits or hernia , no rebound or guarding.  Exam limited due to body habitus. Extremities: No lower extremity edema. No clubbing or deformities. Neuro: Alert and oriented x 4   Skin: Warm and dry, no jaundice.   Psych: Alert and cooperative, normal mood and affect.  Labs:  Lab Results  Component Value Date   WBC 12.4* 01/25/2014   HGB 12.1 01/25/2014   HCT 38.8 01/25/2014   MCV 86.6 01/25/2014   PLT 397 01/25/2014    Imaging Studies: No results found.

## 2014-01-27 NOTE — Telephone Encounter (Signed)
Pt is scheduled to see LL today at 2:30.

## 2014-01-30 ENCOUNTER — Encounter: Payer: Self-pay | Admitting: Gastroenterology

## 2014-01-30 NOTE — Assessment & Plan Note (Signed)
38 y/o female with complaints of recurrent melena. No change in H/H. EGD earlier this year as outlined. Question if true melena. Patient refuses rectal exam. Recommend ifobt. Recheck H/H if recurrent black stools. For diarrhea, check stool studies. Alarm symptoms explained to patient for which she should seek emergency medical attention. Further recommendations to follow.

## 2014-02-01 NOTE — Progress Notes (Signed)
cc'ed to pcp °

## 2014-02-07 ENCOUNTER — Encounter: Payer: Self-pay | Admitting: Gastroenterology

## 2014-02-16 DIAGNOSIS — N898 Other specified noninflammatory disorders of vagina: Secondary | ICD-10-CM | POA: Diagnosis not present

## 2014-02-16 DIAGNOSIS — N926 Irregular menstruation, unspecified: Secondary | ICD-10-CM | POA: Diagnosis not present

## 2014-02-16 DIAGNOSIS — F309 Manic episode, unspecified: Secondary | ICD-10-CM | POA: Diagnosis not present

## 2014-02-16 DIAGNOSIS — Z6841 Body Mass Index (BMI) 40.0 and over, adult: Secondary | ICD-10-CM | POA: Diagnosis not present

## 2014-02-23 DIAGNOSIS — M545 Low back pain: Secondary | ICD-10-CM | POA: Diagnosis not present

## 2014-02-23 DIAGNOSIS — M546 Pain in thoracic spine: Secondary | ICD-10-CM | POA: Diagnosis not present

## 2014-02-23 DIAGNOSIS — M9902 Segmental and somatic dysfunction of thoracic region: Secondary | ICD-10-CM | POA: Diagnosis not present

## 2014-02-23 DIAGNOSIS — M9903 Segmental and somatic dysfunction of lumbar region: Secondary | ICD-10-CM | POA: Diagnosis not present

## 2014-03-18 DIAGNOSIS — N926 Irregular menstruation, unspecified: Secondary | ICD-10-CM | POA: Diagnosis not present

## 2014-04-11 DIAGNOSIS — Z6841 Body Mass Index (BMI) 40.0 and over, adult: Secondary | ICD-10-CM | POA: Diagnosis not present

## 2014-04-11 DIAGNOSIS — Z01419 Encounter for gynecological examination (general) (routine) without abnormal findings: Secondary | ICD-10-CM | POA: Diagnosis not present

## 2014-04-11 DIAGNOSIS — R350 Frequency of micturition: Secondary | ICD-10-CM | POA: Diagnosis not present

## 2014-04-11 DIAGNOSIS — N926 Irregular menstruation, unspecified: Secondary | ICD-10-CM | POA: Diagnosis not present

## 2014-04-14 DIAGNOSIS — F332 Major depressive disorder, recurrent severe without psychotic features: Secondary | ICD-10-CM | POA: Diagnosis not present

## 2014-04-19 DIAGNOSIS — R062 Wheezing: Secondary | ICD-10-CM | POA: Diagnosis not present

## 2014-04-19 DIAGNOSIS — J309 Allergic rhinitis, unspecified: Secondary | ICD-10-CM | POA: Diagnosis not present

## 2014-04-19 DIAGNOSIS — R05 Cough: Secondary | ICD-10-CM | POA: Diagnosis not present

## 2014-04-27 ENCOUNTER — Other Ambulatory Visit: Payer: Self-pay | Admitting: Gastroenterology

## 2014-05-09 DIAGNOSIS — Z79899 Other long term (current) drug therapy: Secondary | ICD-10-CM | POA: Diagnosis not present

## 2014-05-17 ENCOUNTER — Emergency Department (HOSPITAL_COMMUNITY)
Admission: EM | Admit: 2014-05-17 | Discharge: 2014-05-17 | Disposition: A | Discharge status: Home / Self Care | Payer: Medicare Other | Attending: Emergency Medicine | Admitting: Emergency Medicine

## 2014-05-17 ENCOUNTER — Encounter (HOSPITAL_COMMUNITY): Payer: Self-pay | Admitting: Emergency Medicine

## 2014-05-17 DIAGNOSIS — G4730 Sleep apnea, unspecified: Secondary | ICD-10-CM | POA: Diagnosis not present

## 2014-05-17 DIAGNOSIS — K219 Gastro-esophageal reflux disease without esophagitis: Secondary | ICD-10-CM | POA: Diagnosis not present

## 2014-05-17 DIAGNOSIS — G2581 Restless legs syndrome: Secondary | ICD-10-CM | POA: Insufficient documentation

## 2014-05-17 DIAGNOSIS — Z79899 Other long term (current) drug therapy: Secondary | ICD-10-CM | POA: Insufficient documentation

## 2014-05-17 DIAGNOSIS — Y939 Activity, unspecified: Secondary | ICD-10-CM | POA: Diagnosis not present

## 2014-05-17 DIAGNOSIS — F419 Anxiety disorder, unspecified: Secondary | ICD-10-CM | POA: Diagnosis not present

## 2014-05-17 DIAGNOSIS — Y998 Other external cause status: Secondary | ICD-10-CM | POA: Insufficient documentation

## 2014-05-17 DIAGNOSIS — S39012A Strain of muscle, fascia and tendon of lower back, initial encounter: Secondary | ICD-10-CM | POA: Insufficient documentation

## 2014-05-17 DIAGNOSIS — Y929 Unspecified place or not applicable: Secondary | ICD-10-CM | POA: Diagnosis not present

## 2014-05-17 DIAGNOSIS — Z880 Allergy status to penicillin: Secondary | ICD-10-CM | POA: Insufficient documentation

## 2014-05-17 DIAGNOSIS — F329 Major depressive disorder, single episode, unspecified: Secondary | ICD-10-CM | POA: Insufficient documentation

## 2014-05-17 DIAGNOSIS — N939 Abnormal uterine and vaginal bleeding, unspecified: Secondary | ICD-10-CM | POA: Diagnosis not present

## 2014-05-17 DIAGNOSIS — Z87891 Personal history of nicotine dependence: Secondary | ICD-10-CM | POA: Insufficient documentation

## 2014-05-17 DIAGNOSIS — X58XXXA Exposure to other specified factors, initial encounter: Secondary | ICD-10-CM | POA: Insufficient documentation

## 2014-05-17 DIAGNOSIS — Z9981 Dependence on supplemental oxygen: Secondary | ICD-10-CM | POA: Diagnosis not present

## 2014-05-17 DIAGNOSIS — S3992XA Unspecified injury of lower back, initial encounter: Secondary | ICD-10-CM | POA: Diagnosis present

## 2014-05-17 DIAGNOSIS — N926 Irregular menstruation, unspecified: Secondary | ICD-10-CM | POA: Diagnosis not present

## 2014-05-17 DIAGNOSIS — J449 Chronic obstructive pulmonary disease, unspecified: Secondary | ICD-10-CM | POA: Insufficient documentation

## 2014-05-17 DIAGNOSIS — Z3202 Encounter for pregnancy test, result negative: Secondary | ICD-10-CM | POA: Diagnosis not present

## 2014-05-17 DIAGNOSIS — R319 Hematuria, unspecified: Secondary | ICD-10-CM | POA: Insufficient documentation

## 2014-05-17 DIAGNOSIS — Z91040 Latex allergy status: Secondary | ICD-10-CM | POA: Diagnosis not present

## 2014-05-17 LAB — URINALYSIS, ROUTINE W REFLEX MICROSCOPIC
BILIRUBIN URINE: NEGATIVE
Glucose, UA: NEGATIVE mg/dL
HGB URINE DIPSTICK: NEGATIVE
Ketones, ur: NEGATIVE mg/dL
Leukocytes, UA: NEGATIVE
Nitrite: NEGATIVE
PROTEIN: NEGATIVE mg/dL
SPECIFIC GRAVITY, URINE: 1.015 (ref 1.005–1.030)
UROBILINOGEN UA: 0.2 mg/dL (ref 0.0–1.0)
pH: 6.5 (ref 5.0–8.0)

## 2014-05-17 LAB — HCG, QUANTITATIVE, PREGNANCY: hCG, Beta Chain, Quant, S: <1 m[IU]/mL (ref <5)

## 2014-05-17 MED ORDER — NAPROXEN 250 MG PO TABS
500.0000 mg | ORAL_TABLET | Freq: Once | ORAL | Status: AC
  Administered 2014-05-17: 500 mg via ORAL
  Filled 2014-05-17: qty 2

## 2014-05-17 MED ORDER — TRAMADOL HCL 50 MG PO TABS
50.0000 mg | ORAL_TABLET | Freq: Once | ORAL | Status: AC
  Administered 2014-05-17: 50 mg via ORAL
  Filled 2014-05-17: qty 1

## 2014-05-17 MED ORDER — METHOCARBAMOL 500 MG PO TABS
1000.0000 mg | ORAL_TABLET | Freq: Once | ORAL | Status: AC
  Administered 2014-05-17: 1000 mg via ORAL
  Filled 2014-05-17: qty 2

## 2014-05-17 MED ORDER — NAPROXEN 500 MG PO TABS: 500.0000 mg | ORAL_TABLET | Freq: Two times a day (BID) | ORAL | Status: DC

## 2014-05-17 MED ORDER — TRAMADOL HCL 50 MG PO TABS
50.0000 mg | ORAL_TABLET | Freq: Four times a day (QID) | ORAL | Status: DC | PRN
Start: 2014-05-17 — End: 2014-07-14

## 2014-05-17 MED ORDER — METHOCARBAMOL 500 MG PO TABS: 1000.0000 mg | ORAL_TABLET | Freq: Four times a day (QID) | ORAL | Status: AC

## 2014-05-17 NOTE — ED Notes (Signed)
Pt reports low back pain that started yesterday. Pt reports nausea but no emesis.

## 2014-05-17 NOTE — Discharge Instructions (Signed)
Lumbosacral Strain Lumbosacral strain is a strain of any of the parts that make up your lumbosacral vertebrae. Your lumbosacral vertebrae are the bones that make up the lower third of your backbone. Your lumbosacral vertebrae are held together by muscles and tough, fibrous tissue (ligaments).  CAUSES  A sudden blow to your back can cause lumbosacral strain. Also, anything that causes an excessive stretch of the muscles in the low back can cause this strain. This is typically seen when people exert themselves strenuously, fall, lift heavy objects, bend, or crouch repeatedly. RISK FACTORS  Physically demanding work.  Participation in pushing or pulling sports or sports that require a sudden twist of the back (tennis, golf, baseball).  Weight lifting.  Excessive lower back curvature.  Forward-tilted pelvis.  Weak back or abdominal muscles or both.  Tight hamstrings. SIGNS AND SYMPTOMS  Lumbosacral strain may cause pain in the area of your injury or pain that moves (radiates) down your leg.  DIAGNOSIS Your health care provider can often diagnose lumbosacral strain through a physical exam. In some cases, you may need tests such as X-ray exams.  TREATMENT  Treatment for your lower back injury depends on many factors that your clinician will have to evaluate. However, most treatment will include the use of anti-inflammatory medicines. HOME CARE INSTRUCTIONS   Avoid hard physical activities (tennis, racquetball, waterskiing) if you are not in proper physical condition for it. This may aggravate or create problems.  If you have a back problem, avoid sports requiring sudden body movements. Swimming and walking are generally safer activities.  Maintain good posture.  Maintain a healthy weight.  For acute conditions, you may put ice on the injured area.  Put ice in a plastic bag.  Place a towel between your skin and the bag.  Leave the ice on for 20 minutes, 2-3 times a day.  When the  low back starts healing, stretching and strengthening exercises may be recommended. SEEK MEDICAL CARE IF:  Your back pain is getting worse.  You experience severe back pain not relieved with medicines. SEEK IMMEDIATE MEDICAL CARE IF:   You have numbness, tingling, weakness, or problems with the use of your arms or legs.  There is a change in bowel or bladder control.  You have increasing pain in any area of the body, including your belly (abdomen).  You notice shortness of breath, dizziness, or feel faint.  You feel sick to your stomach (nauseous), are throwing up (vomiting), or become sweaty.  You notice discoloration of your toes or legs, or your feet get very cold. MAKE SURE YOU:   Understand these instructions.  Will watch your condition.  Will get help right away if you are not doing well or get worse. Document Released: 01/02/2005 Document Revised: 03/30/2013 Document Reviewed: 11/11/2012 [facility] Patient Information 2015 [place], [place]. This information is not intended to replace advice given to you by your health care provider. Make sure you discuss any questions you have with your health care provider.   Take the medications as directed.  Do not drive within 4 hours of taking tramadol and robaxin as these medicines will make you drowsy.  Avoid lifting,  Bending,  Twisting or any other activity that worsens your pain over the next week.  Apply a heating pad to your lower back for 20 minutes 3-4 times daily.   You should get rechecked if your symptoms are not better over the next 5 days,  Or you develop increased pain,  Weakness in  your leg(s) or loss of bladder or bowel function - these are symptoms of a worse injury.

## 2014-05-17 NOTE — ED Notes (Signed)
Back pain for last 2 days, No known injury. Hx of spinal stenosis and has had injections in her back in past for similar pain.

## 2014-05-18 NOTE — ED Provider Notes (Signed)
CSN: [id number]     Arrival date & time 05/17/14  1711 History   First MD Initiated Contact with Patient 05/17/14 1750     Chief Complaint  Patient presents with  . Back Pain     (Consider location/radiation/quality/duration/timing/severity/associated sxs/prior Treatment) The history is provided by the patient and a parent.   Heather [name] is a 39 y.o. female presenting with a 2 day history of low back pain which is worsened with movement, better at rest, but with radiation bilaterally around to her anterior groin.  She reports a history of prior similar low back pain and diagnosed with spinal stenosis treated by Dr. Ace Gins in the past with steroid injections. She denies injury.  She also endorses nausea without emesis.  She was seen by her gynecologist in [place] today due to having one episode of hematuria without dysuria 2 days ago and was told this was probably menstrual spotting.  She denies vaginal spotting however, her LMP was 12/22 and is concerned for possible pregnancy.  Her urine preg test today was negative, she states with her last pregnancy, bloodwork had to be done to confirm her pregnancy status and is requesting this test today. She denies breast tenderness, abdominal pain, fevers, chills, has no history of kidney stones.     Past Medical History  Diagnosis Date  . Amenorrhea   . Dysmenorrhea   . COPD (chronic obstructive pulmonary disease)   . Dysrhythmia     DR Johnsie Cancel    . Asthma   . Sleep apnea     CPAP  . Shortness of breath     WITH EXERTION   . GERD (gastroesophageal reflux disease)     HEARTBURN   TUMS  . Neuromuscular disorder     RESTLESS LEG   . Depression   . Anxiety   . Obesity   . Morbid obesity   . Ectopic pregnancy 2013  . Eosinophilic esophagitis     Diagnosed at [facility] 06/16/2013, untreated   Past Surgical History  Procedure Laterality Date  . Cholecystectomy    . Tonsillectomy    . Tooth extraction  10/28/2011    Procedure: DENTAL  RESTORATION/EXTRACTIONS;  Surgeon: Gae Bon, DDS;  Location: [facility];  Service: Oral Surgery;;  . Dental surgery    . Esophagogastroduodenoscopy  May 2007    Dr. Gala Romney: Normal esophagus, stomach, D1, D2  . Esophagogastroduodenoscopy  06/16/2013    Dr. Carlton Adam, eosinophilic esophagitis, reactive gastropathy, no esophageal dilation   Family History  Problem Relation Age of Onset  . Depression Mother   . Hypertension Sister   . Diabetes Paternal Uncle   . Anxiety disorder Paternal Uncle   . Colon cancer Maternal Grandmother     66s  . Liver disease Neg Hx    History  Substance Use Topics  . Smoking status: Former Smoker -- 0.50 packs/day for 8 years    Types: Cigarettes    Quit date: 04/25/2011  . Smokeless tobacco: Never Used  . Alcohol Use: No     Comment: occasional    OB History    Gravida Para Term Preterm AB TAB SAB Ectopic Multiple Living   1    1  1         Review of Systems  Constitutional: Negative for fever and chills.  HENT: Negative for congestion and sore throat.   Eyes: Negative.   Respiratory: Negative for chest tightness and shortness of breath.   Cardiovascular: Negative for chest pain.  Gastrointestinal:  Positive for nausea. Negative for vomiting and abdominal pain.  Genitourinary: Positive for hematuria and menstrual problem. Negative for dysuria, urgency, flank pain, vaginal discharge and pelvic pain.  Musculoskeletal: Positive for back pain. Negative for joint swelling, arthralgias and neck pain.  Skin: Negative.  Negative for rash and wound.  Neurological: Negative for dizziness, weakness, light-headedness, numbness and headaches.  Psychiatric/Behavioral: Negative.       Allergies  Bee venom; Nutritional supplements; Penicillins; Adhesive; and Latex  Home Medications   Prior to Admission medications   Medication Sig Start Date End Date Taking? Authorizing Provider  acetaminophen (TYLENOL) 500 MG tablet Take 1,000 mg by mouth every 6 (six)  hours as needed. pain   Yes Historical Provider, MD  albuterol (PROVENTIL HFA;VENTOLIN HFA) 108 (90 BASE) MCG/ACT inhaler Inhale 1-2 puffs into the lungs every 6 (six) hours as needed for wheezing. 11/15/12  Yes Hope Bunnie Pion, NP  buPROPion (WELLBUTRIN XL) 300 MG 24 hr tablet Take 300 mg by mouth daily. 04/28/14  Yes Historical Provider, MD  busPIRone (BUSPAR) 10 MG tablet Take 15 mg by mouth 2 (two) times daily. For anxiety   Yes Historical Provider, MD  furosemide (LASIX) 20 MG tablet Take 40 mg by mouth daily. For leg edema, must take with potassium and must follow -up with primary provider to evaluate the needed replacement of potassium, 08/12/11  Yes Darrol Jump, MD  loratadine (CLARITIN) 10 MG tablet Take 10 mg by mouth daily.   Yes Historical Provider, MD  montelukast (SINGULAIR) 10 MG tablet Take 10 mg by mouth at bedtime.   Yes Historical Provider, MD  pantoprazole (PROTONIX) 40 MG tablet TAKE 1 TABLET BY MOUTH EVERY DAY 04/27/14  Yes Ranae Pila, NP  potassium chloride (K-DUR) 10 MEQ tablet Take 1 tablet (10 mEq total) by mouth 2 (two) times daily. For potassium replacement (Lasix causes potassium loss) 08/12/11  Yes Darrol Jump, MD  Prenatal Vit-Fe Fumarate-FA (PRENATAL MULTIVITAMIN) TABS Take 1 tablet by mouth daily at 12 noon.   Yes Historical Provider, MD  promethazine (PHENERGAN) 25 MG tablet Take 1 tablet (25 mg total) by mouth every 6 (six) hours as needed for nausea or vomiting. 06/25/13  Yes Mahala Menghini, PA-C  rOPINIRole (REQUIP) 1 MG tablet Take 1 mg by mouth at bedtime.   Yes Historical Provider, MD  vitamin E 400 UNIT capsule Take 400 Units by mouth daily.   Yes Historical Provider, MD  ziprasidone (GEODON) 60 MG capsule Take 60 mg by mouth every evening.    Yes Historical Provider, MD  methocarbamol (ROBAXIN) 500 MG tablet Take 2 tablets (1,000 mg total) by mouth 4 (four) times daily. 05/17/14 05/27/14  Evalee Jefferson, PA-C  naproxen (NAPROSYN) 500 MG tablet Take 1 tablet (500 mg  total) by mouth 2 (two) times daily. 05/17/14   Evalee Jefferson, PA-C  Probiotic Product (ALIGN) 4 MG CAPS Take 1 capsule (4 mg total) by mouth daily. Patient not taking: Reported on 05/17/2014 11/17/13   Mahala Menghini, PA-C  traMADol (ULTRAM) 50 MG tablet Take 1 tablet (50 mg total) by mouth every 6 (six) hours as needed. 05/17/14   Evalee Jefferson, PA-C   BP 168/75 mmHg  Pulse 71  Temp(Src) 98 F (36.7 C) (Oral)  Resp 20  Ht 5\' 3"  (1.6 m)  Wt 450 lb (204.119 kg)  BMI 79.73 kg/m2  SpO2 100%  LMP 03/29/2014 Physical Exam  Constitutional: She appears well-developed.  Morbidly obese  HENT:  Head: Normocephalic.  Eyes: Conjunctivae are normal.  Cardiovascular: Normal rate and intact distal pulses.   Pedal pulses normal.  Pulmonary/Chest: Effort normal.  Abdominal: Soft. Bowel sounds are normal. She exhibits no mass. There is no tenderness. There is no guarding.  Musculoskeletal: Normal range of motion. She exhibits no edema.       Lumbar back: She exhibits tenderness. She exhibits no swelling, no edema and no spasm.  Bilateral paralumbar ttp  Neurological: She is alert. She has normal strength. She displays no atrophy and no tremor. No sensory deficit. Gait normal.  Reflex Scores:      Patellar reflexes are 2+ on the right side and 2+ on the left side.      Achilles reflexes are 2+ on the right side and 2+ on the left side. No strength deficit noted in hip and knee flexor and extensor muscle groups.  Ankle flexion and extension intact, 5/5 strength. Ambulatory with waddling slow gait, no foot drop.  Skin: Skin is warm and dry.  Psychiatric: She has a normal mood and affect.  Nursing note and vitals reviewed.   ED Course  Procedures (including critical care time) Labs Review Labs Reviewed  HCG, QUANTITATIVE, PREGNANCY  URINALYSIS, ROUTINE W REFLEX MICROSCOPIC    Imaging Review No results found.   EKG Interpretation None      MDM   Final diagnoses:  Lumbar strain, initial  encounter    Patients labs and/or radiological studies were viewed and considered during the medical decision making and disposition process. Pt reassured regarding negative pregnancy status.  She was prescribed robaxin, naproxen and tramadol for low back pain relief, heat tx. F/u with pcp if sx persist.  No neuro deficit on exam or by history to suggest emergent or surgical presentation.  Also discussed worsened sx that should prompt immediate re-evaluation including distal weakness, bowel/bladder retention/incontinence.          Evalee Jefferson, PA-C 05/18/14 1342  Shaune Pollack, MD 05/19/14 [phone]

## 2014-06-02 ENCOUNTER — Encounter (HOSPITAL_COMMUNITY): Payer: Self-pay

## 2014-06-02 ENCOUNTER — Emergency Department (HOSPITAL_COMMUNITY): Payer: Medicare Other

## 2014-06-02 ENCOUNTER — Emergency Department (HOSPITAL_COMMUNITY)
Admission: EM | Admit: 2014-06-02 | Discharge: 2014-06-02 | Disposition: A | Discharge status: Home / Self Care | Payer: Medicare Other | Attending: Emergency Medicine | Admitting: Emergency Medicine

## 2014-06-02 DIAGNOSIS — Z8669 Personal history of other diseases of the nervous system and sense organs: Secondary | ICD-10-CM | POA: Diagnosis not present

## 2014-06-02 DIAGNOSIS — G4730 Sleep apnea, unspecified: Secondary | ICD-10-CM | POA: Diagnosis not present

## 2014-06-02 DIAGNOSIS — M545 Low back pain, unspecified: Secondary | ICD-10-CM

## 2014-06-02 DIAGNOSIS — Z791 Long term (current) use of non-steroidal anti-inflammatories (NSAID): Secondary | ICD-10-CM | POA: Diagnosis not present

## 2014-06-02 DIAGNOSIS — F329 Major depressive disorder, single episode, unspecified: Secondary | ICD-10-CM | POA: Diagnosis not present

## 2014-06-02 DIAGNOSIS — Z880 Allergy status to penicillin: Secondary | ICD-10-CM | POA: Insufficient documentation

## 2014-06-02 DIAGNOSIS — J449 Chronic obstructive pulmonary disease, unspecified: Secondary | ICD-10-CM | POA: Insufficient documentation

## 2014-06-02 DIAGNOSIS — M25561 Pain in right knee: Secondary | ICD-10-CM

## 2014-06-02 DIAGNOSIS — S99921A Unspecified injury of right foot, initial encounter: Secondary | ICD-10-CM | POA: Diagnosis not present

## 2014-06-02 DIAGNOSIS — F419 Anxiety disorder, unspecified: Secondary | ICD-10-CM | POA: Insufficient documentation

## 2014-06-02 DIAGNOSIS — K219 Gastro-esophageal reflux disease without esophagitis: Secondary | ICD-10-CM | POA: Diagnosis not present

## 2014-06-02 DIAGNOSIS — M79671 Pain in right foot: Secondary | ICD-10-CM | POA: Diagnosis not present

## 2014-06-02 DIAGNOSIS — G2581 Restless legs syndrome: Secondary | ICD-10-CM | POA: Diagnosis not present

## 2014-06-02 DIAGNOSIS — S3992XA Unspecified injury of lower back, initial encounter: Secondary | ICD-10-CM | POA: Insufficient documentation

## 2014-06-02 DIAGNOSIS — Z91040 Latex allergy status: Secondary | ICD-10-CM | POA: Diagnosis not present

## 2014-06-02 DIAGNOSIS — W1830XA Fall on same level, unspecified, initial encounter: Secondary | ICD-10-CM | POA: Insufficient documentation

## 2014-06-02 DIAGNOSIS — Z79899 Other long term (current) drug therapy: Secondary | ICD-10-CM | POA: Diagnosis not present

## 2014-06-02 DIAGNOSIS — Z3202 Encounter for pregnancy test, result negative: Secondary | ICD-10-CM | POA: Diagnosis not present

## 2014-06-02 DIAGNOSIS — G8929 Other chronic pain: Secondary | ICD-10-CM | POA: Diagnosis not present

## 2014-06-02 DIAGNOSIS — Y9389 Activity, other specified: Secondary | ICD-10-CM | POA: Insufficient documentation

## 2014-06-02 DIAGNOSIS — Y998 Other external cause status: Secondary | ICD-10-CM | POA: Diagnosis not present

## 2014-06-02 DIAGNOSIS — Z9981 Dependence on supplemental oxygen: Secondary | ICD-10-CM | POA: Insufficient documentation

## 2014-06-02 DIAGNOSIS — S9031XA Contusion of right foot, initial encounter: Secondary | ICD-10-CM

## 2014-06-02 DIAGNOSIS — Z8742 Personal history of other diseases of the female genital tract: Secondary | ICD-10-CM | POA: Insufficient documentation

## 2014-06-02 DIAGNOSIS — Z87891 Personal history of nicotine dependence: Secondary | ICD-10-CM | POA: Insufficient documentation

## 2014-06-02 DIAGNOSIS — S8991XA Unspecified injury of right lower leg, initial encounter: Secondary | ICD-10-CM | POA: Diagnosis not present

## 2014-06-02 DIAGNOSIS — Z6841 Body Mass Index (BMI) 40.0 and over, adult: Secondary | ICD-10-CM | POA: Diagnosis not present

## 2014-06-02 DIAGNOSIS — Y9289 Other specified places as the place of occurrence of the external cause: Secondary | ICD-10-CM | POA: Insufficient documentation

## 2014-06-02 DIAGNOSIS — W19XXXA Unspecified fall, initial encounter: Secondary | ICD-10-CM

## 2014-06-02 DIAGNOSIS — M5137 Other intervertebral disc degeneration, lumbosacral region: Secondary | ICD-10-CM | POA: Diagnosis not present

## 2014-06-02 DIAGNOSIS — M5126 Other intervertebral disc displacement, lumbar region: Secondary | ICD-10-CM | POA: Diagnosis not present

## 2014-06-02 LAB — POC URINE PREG, ED: PREG TEST UR: NEGATIVE

## 2014-06-02 MED ORDER — KETOROLAC TROMETHAMINE 60 MG/2ML IM SOLN
60.0000 mg | Freq: Once | INTRAMUSCULAR | Status: AC
  Administered 2014-06-02: 60 mg via INTRAMUSCULAR
  Filled 2014-06-02: qty 2

## 2014-06-02 MED ORDER — HYDROCODONE-ACETAMINOPHEN 5-325 MG PO TABS
1.0000 | ORAL_TABLET | Freq: Once | ORAL | Status: AC
Start: 2014-06-02 — End: 2014-06-02
  Administered 2014-06-02: 1 via ORAL
  Filled 2014-06-02: qty 1

## 2014-06-02 MED ORDER — HYDROCODONE-ACETAMINOPHEN 5-325 MG PO TABS: 1.0000 | ORAL_TABLET | ORAL | Status: DC | PRN

## 2014-06-02 NOTE — ED Provider Notes (Signed)
CSN: [id number]     Arrival date & time 06/02/14  1635 History   First MD Initiated Contact with Patient 06/02/14 1700     Chief Complaint  Patient presents with  . Back Pain     (Consider location/radiation/quality/duration/timing/severity/associated sxs/prior Treatment) The history is provided by the patient.   Heather [name] is a 39 y.o. female presenting for evaluation of increased pain in her mid lower back, her right knee and her right great toe and medial foot since falling 4 hours ago.  She was being transported via a medical transport Lucianne Lei to her pcps for a routine followup when she tripped at the base of the van ramp and fell face forward onto cement.  She denies hitting her head and denies LOC, nausea, dizziness since the event.  She states she was not evaluated for this injury by her PCP, but since leaving her appointment has had increased pain at the sites listed above.  She is currently taking naproxen, Robaxin and tramadol for chronic low back pain which she states does not help with her symptoms.  She denies weakness or numbness in her extremities since this injury.  She has taken no medications for pain since her fall.     Past Medical History  Diagnosis Date  . Amenorrhea   . Dysmenorrhea   . COPD (chronic obstructive pulmonary disease)   . Dysrhythmia     DR Johnsie Cancel    . Asthma   . Sleep apnea     CPAP  . Shortness of breath     WITH EXERTION   . GERD (gastroesophageal reflux disease)     HEARTBURN   TUMS  . Neuromuscular disorder     RESTLESS LEG   . Depression   . Anxiety   . Obesity   . Morbid obesity   . Ectopic pregnancy 2013  . Eosinophilic esophagitis     Diagnosed at [facility] 06/16/2013, untreated   Past Surgical History  Procedure Laterality Date  . Cholecystectomy    . Tonsillectomy    . Tooth extraction  10/28/2011    Procedure: DENTAL RESTORATION/EXTRACTIONS;  Surgeon: Gae Bon, DDS;  Location: [facility];  Service: Oral Surgery;;  . Dental  surgery    . Esophagogastroduodenoscopy  May 2007    Dr. Gala Romney: Normal esophagus, stomach, D1, D2  . Esophagogastroduodenoscopy  06/16/2013    Dr. Carlton Adam, eosinophilic esophagitis, reactive gastropathy, no esophageal dilation   Family History  Problem Relation Age of Onset  . Depression Mother   . Hypertension Sister   . Diabetes Paternal Uncle   . Anxiety disorder Paternal Uncle   . Colon cancer Maternal Grandmother     48s  . Liver disease Neg Hx    History  Substance Use Topics  . Smoking status: Former Smoker -- 0.50 packs/day for 8 years    Types: Cigarettes    Quit date: 04/25/2011  . Smokeless tobacco: Never Used  . Alcohol Use: No     Comment: occasional    OB History    Gravida Para Term Preterm AB TAB SAB Ectopic Multiple Living   1    1  1         Review of Systems  Constitutional: Negative.   HENT: Negative.   Eyes: Negative for visual disturbance.  Respiratory: Negative.   Gastrointestinal: Negative for nausea.  Musculoskeletal: Positive for back pain and arthralgias. Negative for myalgias, joint swelling and neck pain.  Neurological: Negative for weakness, light-headedness, numbness and headaches.  Psychiatric/Behavioral: Negative for confusion.      Allergies  Bee venom; Nutritional supplements; Penicillins; Adhesive; and Latex  Home Medications   Prior to Admission medications   Medication Sig Start Date End Date Taking? Authorizing Provider  acetaminophen (TYLENOL) 500 MG tablet Take 1,000 mg by mouth every 6 (six) hours as needed. pain   Yes Historical Provider, MD  albuterol (PROVENTIL HFA;VENTOLIN HFA) 108 (90 BASE) MCG/ACT inhaler Inhale 1-2 puffs into the lungs every 6 (six) hours as needed for wheezing. 11/15/12  Yes Hope Bunnie Pion, NP  buPROPion (WELLBUTRIN XL) 300 MG 24 hr tablet Take 300 mg by mouth daily. 04/28/14  Yes Historical Provider, MD  busPIRone (BUSPAR) 10 MG tablet Take 15 mg by mouth 2 (two) times daily. For anxiety   Yes  Historical Provider, MD  furosemide (LASIX) 20 MG tablet Take 40 mg by mouth daily. For leg edema, must take with potassium and must follow -up with primary provider to evaluate the needed replacement of potassium, 08/12/11  Yes Darrol Jump, MD  loratadine (CLARITIN) 10 MG tablet Take 10 mg by mouth daily.   Yes Historical Provider, MD  mometasone [facility]) 220 MCG/INH inhaler Inhale 2 puffs into the lungs daily.   Yes Historical Provider, MD  montelukast (SINGULAIR) 10 MG tablet Take 10 mg by mouth at bedtime.   Yes Historical Provider, MD  pantoprazole (PROTONIX) 40 MG tablet TAKE 1 TABLET BY MOUTH EVERY DAY 04/27/14  Yes Ranae Pila, NP  potassium chloride (K-DUR) 10 MEQ tablet Take 1 tablet (10 mEq total) by mouth 2 (two) times daily. For potassium replacement (Lasix causes potassium loss) 08/12/11  Yes Darrol Jump, MD  Prenatal Vit-Fe Fumarate-FA (PRENATAL MULTIVITAMIN) TABS Take 1 tablet by mouth daily at 12 noon.   Yes Historical Provider, MD  promethazine (PHENERGAN) 25 MG tablet Take 1 tablet (25 mg total) by mouth every 6 (six) hours as needed for nausea or vomiting. 06/25/13  Yes Mahala Menghini, PA-C  traMADol (ULTRAM) 50 MG tablet Take 1 tablet (50 mg total) by mouth every 6 (six) hours as needed. 05/17/14  Yes Evalee Jefferson, PA-C  vitamin E 400 UNIT capsule Take 400 Units by mouth daily.   Yes Historical Provider, MD  ziprasidone (GEODON) 60 MG capsule Take 60 mg by mouth every evening.    Yes Historical Provider, MD  HYDROcodone-acetaminophen (NORCO/VICODIN) 5-325 MG per tablet Take 1 tablet by mouth every 4 (four) hours as needed. 06/02/14   Evalee Jefferson, PA-C  naproxen (NAPROSYN) 500 MG tablet Take 1 tablet (500 mg total) by mouth 2 (two) times daily. Patient not taking: Reported on 06/02/2014 05/17/14   Evalee Jefferson, PA-C  Probiotic Product (ALIGN) 4 MG CAPS Take 1 capsule (4 mg total) by mouth daily. Patient not taking: Reported on 05/17/2014 11/17/13   Mahala Menghini, PA-C  rOPINIRole  (REQUIP) 1 MG tablet Take 1 mg by mouth at bedtime.    Historical Provider, MD   BP 165/92 mmHg  Pulse 90  Temp(Src) 98.2 F (36.8 C) (Oral)  Resp 20  Ht 5\' 3"  (1.6 m)  Wt 450 lb (204.119 kg)  BMI 79.73 kg/m2  SpO2 100%  LMP 03/29/2014 Physical Exam  Constitutional:  Morbidly obese  HENT:  Head: Normocephalic.  Eyes: Conjunctivae are normal.  Neck: Normal range of motion. Neck supple.  Cardiovascular: Normal rate and intact distal pulses.   Pedal pulses normal.  Pulmonary/Chest: Effort normal.  Abdominal: Soft. Bowel sounds are normal. She exhibits no  distension and no mass.  Musculoskeletal: Normal range of motion. She exhibits no edema.       Right knee: She exhibits bony tenderness. She exhibits no LCL laxity and no MCL laxity. No medial joint line and no lateral joint line tenderness noted.       Lumbar back: She exhibits tenderness. She exhibits no swelling, no edema and no spasm.       Right foot: There is bony tenderness. There is no swelling and normal capillary refill.  paralumbar ttp.  ttp anterior right patella.  No appreciable edema in comparison to left.  Superficial abrasion right anterior knee.  Neurological: She is alert. She has normal strength. She displays no atrophy and no tremor. No sensory deficit. Gait normal.  No strength deficit noted in hip and knee flexor and extensor muscle groups.  Ankle flexion and extension intact.  Skin: Skin is warm and dry.  Psychiatric: She has a normal mood and affect.  Nursing note and vitals reviewed.   ED Course  Procedures (including critical care time) Labs Review Labs Reviewed  POC URINE PREG, ED    Imaging Review Dg Lumbar Spine Complete  06/02/2014   CLINICAL DATA:  Patient fell forward from a van landing on chest and abdomen. Complains of low back pain, right knee pain, right foot pain. History of morbid obesity. Positioning is limited due to patient's body habitus.  EXAM: LUMBAR SPINE - COMPLETE 4+ VIEW   COMPARISON:  CT lumbar spine 11/03/2010  FINDINGS: Examination is limited due to soft tissue attenuation. There is no evidence of lumbar spine fracture. Alignment is normal. Degenerative changes with narrowed disc spaces and endplate hypertrophic changes.  IMPRESSION: Technically limited study. Mild degenerative changes. Normal alignment. No displaced fractures identified.   Electronically Signed   By: Lucienne Capers M.D.   On: 06/02/2014 18:45   Dg Knee Complete 4 Views Right  06/02/2014   CLINICAL DATA:  Fall from the vehicle with right knee pain, initial encounter  EXAM: RIGHT KNEE - COMPLETE 4+ VIEW  COMPARISON:  None.  FINDINGS: Significant degenerative changes are noted in the patellofemoral space. The patella is high-riding likely of a chronic nature. Medial and lateral joint compartment degenerative change is noted. No acute fracture is seen.  IMPRESSION: Degenerative changes without acute abnormality.   Electronically Signed   By: Inez Catalina M.D.   On: 06/02/2014 18:42   Dg Foot Complete Right  06/02/2014   CLINICAL DATA:  Fall. Foot pain. Acute RIGHT foot pain. Morbid obesity.  EXAM: RIGHT FOOT COMPLETE - 3+ VIEW  COMPARISON:  None.  FINDINGS: No acute fracture the RIGHT foot. The alignment appears within normal limits. Soft tissue swelling cannot be assessed due to obesity. Calcaneal spurs incidentally noted.  IMPRESSION: No acute osseous abnormality.   Electronically Signed   By: Dereck Ligas M.D.   On: 06/02/2014 18:42     EKG Interpretation None      MDM   Final diagnoses:  Fall, initial encounter  Bilateral low back pain without sciatica  Knee pain, acute, right  Foot contusion, right, initial encounter    Patients labs and/or radiological studies were viewed and considered during the medical decision making and disposition process. Xrays discussed with patient.  She was ambulatory in ed.  Toradol given with some pain relief.  Encouraged to continue her home medications.   Hydrocodone in place of her tramadol for the next few days.  F/u with pcp if sx not improved over the  next week.  Ice tx x 2 days, heat tx on day 3.  The patient appears reasonably screened and/or stabilized for discharge and I doubt any other medical condition or other [facility] requiring further screening, evaluation, or treatment in the ED at this time prior to discharge.     Evalee Jefferson, PA-C 06/02/14 1928  Virgel Manifold, MD 06/08/14 [phone]

## 2014-06-02 NOTE — ED Notes (Signed)
Pt states she fell while trying to get out of car, c/o of lower back, right leg and toe pain. Pt seen recently for lower lumbar strain

## 2014-06-02 NOTE — Discharge Instructions (Signed)
Contusion A contusion is a deep bruise. Contusions are the result of an injury that caused bleeding under the skin. The contusion may turn blue, purple, or yellow. Minor injuries will give you a painless contusion, but more severe contusions may stay painful and swollen for a few weeks.  CAUSES  A contusion is usually caused by a blow, trauma, or direct force to an area of the body. SYMPTOMS   Swelling and redness of the injured area.  Bruising of the injured area.  Tenderness and soreness of the injured area.  Pain. DIAGNOSIS  The diagnosis can be made by taking a history and physical exam. An X-ray, CT scan, or MRI may be needed to determine if there were any associated injuries, such as fractures. TREATMENT  Specific treatment will depend on what area of the body was injured. In general, the best treatment for a contusion is resting, icing, elevating, and applying cold compresses to the injured area. Over-the-counter medicines may also be recommended for pain control. Ask your caregiver what the best treatment is for your contusion. HOME CARE INSTRUCTIONS   Put ice on the injured area.  Put ice in a plastic bag.  Place a towel between your skin and the bag.  Leave the ice on for 15-20 minutes, 3-4 times a day, or as directed by your health care provider.  Only take over-the-counter or prescription medicines for pain, discomfort, or fever as directed by your caregiver. Your caregiver may recommend avoiding anti-inflammatory medicines (aspirin, ibuprofen, and naproxen) for 48 hours because these medicines may increase bruising.  Rest the injured area.  If possible, elevate the injured area to reduce swelling. SEEK IMMEDIATE MEDICAL CARE IF:   You have increased bruising or swelling.  You have pain that is getting worse.  Your swelling or pain is not relieved with medicines. MAKE SURE YOU:   Understand these instructions.  Will watch your condition.  Will get help right  away if you are not doing well or get worse. Document Released: 01/02/2005 Document Revised: 03/30/2013 Document Reviewed: 01/28/2011 [facility] Patient Information 2015 [place], [place]. This information is not intended to replace advice given to you by your health care provider. Make sure you discuss any questions you have with your health care provider.  Fall Prevention and Home Safety Falls cause injuries and can affect all age groups. It is possible to use preventive measures to significantly decrease the likelihood of falls. There are many simple measures which can make your home safer and prevent falls. OUTDOORS  Repair cracks and edges of walkways and driveways.  Remove high doorway thresholds.  Trim shrubbery on the main path into your home.  Have good outside lighting.  Clear walkways of tools, rocks, debris, and clutter.  Check that handrails are not broken and are securely fastened. Both sides of steps should have handrails.  Have leaves, snow, and ice cleared regularly.  Use sand or salt on walkways during winter months.  In the garage, clean up grease or oil spills. BATHROOM  Install night lights.  Install grab bars by the toilet and in the tub and shower.  Use non-skid mats or decals in the tub or shower.  Place a plastic non-slip stool in the shower to sit on, if needed.  Keep floors dry and clean up all water on the floor immediately.  Remove soap buildup in the tub or shower on a regular basis.  Secure bath mats with non-slip, double-sided rug tape.  Remove throw rugs and tripping  hazards from the floors. BEDROOMS  Install night lights.  Make sure a bedside light is easy to reach.  Do not use oversized bedding.  Keep a telephone by your bedside.  Have a firm chair with side arms to use for getting dressed.  Remove throw rugs and tripping hazards from the floor. KITCHEN  Keep handles on pots and pans turned toward the center of the stove. Use  back burners when possible.  Clean up spills quickly and allow time for drying.  Avoid walking on wet floors.  Avoid hot utensils and knives.  Position shelves so they are not too high or low.  Place commonly used objects within easy reach.  If necessary, use a sturdy step stool with a grab bar when reaching.  Keep electrical cables out of the way.  Do not use floor polish or wax that makes floors slippery. If you must use wax, use non-skid floor wax.  Remove throw rugs and tripping hazards from the floor. STAIRWAYS  Never leave objects on stairs.  Place handrails on both sides of stairways and use them. Fix any loose handrails. Make sure handrails on both sides of the stairways are as long as the stairs.  Check carpeting to make sure it is firmly attached along stairs. Make repairs to worn or loose carpet promptly.  Avoid placing throw rugs at the top or bottom of stairways, or properly secure the rug with carpet tape to prevent slippage. Get rid of throw rugs, if possible.  Have an electrician put in a light switch at the top and bottom of the stairs. OTHER FALL PREVENTION TIPS  Wear low-heel or rubber-soled shoes that are supportive and fit well. Wear closed toe shoes.  When using a stepladder, make sure it is fully opened and both spreaders are firmly locked. Do not climb a closed stepladder.  Add color or contrast paint or tape to grab bars and handrails in your home. Place contrasting color strips on first and last steps.  Learn and use mobility aids as needed. Install an electrical emergency response system.  Turn on lights to avoid dark areas. Replace light bulbs that burn out immediately. Get light switches that glow.  Arrange furniture to create clear pathways. Keep furniture in the same place.  Firmly attach carpet with non-skid or double-sided tape.  Eliminate uneven floor surfaces.  Select a carpet pattern that does not visually hide the edge of  steps.  Be aware of all pets. OTHER HOME SAFETY TIPS  Set the water temperature for 120 F (48.8 C).  Keep emergency numbers on or near the telephone.  Keep smoke detectors on every level of the home and near sleeping areas. Document Released: 03/15/2002 Document Revised: 09/24/2011 Document Reviewed: 06/14/2011 [facility] Patient Information 2015 [place], [place]. This information is not intended to replace advice given to you by your health care provider. Make sure you discuss any questions you have with your health care provider.   Use the new pain medication prescription in place of your tramadol for the next several days for improved pain relief.  Follow up with your doctor for recheck if your symptoms are not improving over the next week.  Her x-rays are negative for any obvious injury today.

## 2014-06-09 DIAGNOSIS — M545 Low back pain: Secondary | ICD-10-CM | POA: Diagnosis not present

## 2014-06-24 ENCOUNTER — Ambulatory Visit: Payer: Medicare Other | Admitting: Gastroenterology

## 2014-07-05 DIAGNOSIS — F332 Major depressive disorder, recurrent severe without psychotic features: Secondary | ICD-10-CM | POA: Diagnosis not present

## 2014-07-08 DIAGNOSIS — F064 Anxiety disorder due to known physiological condition: Secondary | ICD-10-CM | POA: Diagnosis not present

## 2014-07-08 DIAGNOSIS — E785 Hyperlipidemia, unspecified: Secondary | ICD-10-CM | POA: Diagnosis not present

## 2014-07-08 DIAGNOSIS — R7309 Other abnormal glucose: Secondary | ICD-10-CM | POA: Diagnosis not present

## 2014-07-08 DIAGNOSIS — F3341 Major depressive disorder, recurrent, in partial remission: Secondary | ICD-10-CM | POA: Diagnosis not present

## 2014-07-13 DIAGNOSIS — M546 Pain in thoracic spine: Secondary | ICD-10-CM | POA: Diagnosis not present

## 2014-07-13 DIAGNOSIS — M9902 Segmental and somatic dysfunction of thoracic region: Secondary | ICD-10-CM | POA: Diagnosis not present

## 2014-07-13 DIAGNOSIS — M545 Low back pain: Secondary | ICD-10-CM | POA: Diagnosis not present

## 2014-07-13 DIAGNOSIS — M9903 Segmental and somatic dysfunction of lumbar region: Secondary | ICD-10-CM | POA: Diagnosis not present

## 2014-07-14 ENCOUNTER — Ambulatory Visit (INDEPENDENT_AMBULATORY_CARE_PROVIDER_SITE_OTHER): Payer: Medicare Other | Admitting: Gastroenterology

## 2014-07-14 ENCOUNTER — Encounter: Payer: Self-pay | Admitting: Gastroenterology

## 2014-07-14 VITALS — BP 134/83 | HR 73 | Temp 98.2°F | Ht 66.0 in | Wt >= 6400 oz

## 2014-07-14 DIAGNOSIS — R1084 Generalized abdominal pain: Secondary | ICD-10-CM | POA: Diagnosis not present

## 2014-07-14 DIAGNOSIS — R109 Unspecified abdominal pain: Secondary | ICD-10-CM | POA: Insufficient documentation

## 2014-07-14 DIAGNOSIS — R195 Other fecal abnormalities: Secondary | ICD-10-CM | POA: Insufficient documentation

## 2014-07-14 MED ORDER — DICYCLOMINE HCL 20 MG PO TABS: 20.0000 mg | ORAL_TABLET | Freq: Three times a day (TID) | ORAL | Status: DC

## 2014-07-14 NOTE — Assessment & Plan Note (Signed)
39 year old female with morbid obesity who presents with worsening of baseline abdominal cramping associated with intermittent loose stools. She perceived symptoms being worse after recent fall on her abdomen. Evaluated in the emergency department shortly after the event occurred. Suspect symptoms IBS related. Less likely traumatic given this far out from her injury.  Obtain blood work. Trial of Bentyl 20 mg every before meals when necessary cramping. Hold for constipation. Call in 2-3 weeks with a progress report.

## 2014-07-14 NOTE — Patient Instructions (Signed)
1. Call or send message via MyChart in 2-3 weeks with a progress report.  2. Please get labs done. 3. Start Bentyl one pill three times daily for cramps. Hold for constipation.

## 2014-07-14 NOTE — Progress Notes (Signed)
Primary Care Physician: Stephens Shire, MD  Primary Gastroenterologist:  Barney Drain, MD  Chief Complaint  Patient presents with  . Abdominal Pain    HPI: Heather [name] is a 39 y.o. female here for follow-up. Seen back in October 2015. History of reactive gastropathy in eosinophilic esophagitis diagnosed at [facility] earlier in 2015. No medications provided. Previously declined fluticasone offered by [place]. Seen by allergist, just environmental allergies noted. History of intermittent abdominal cramping and hard to loose stools, GERD previously.  Presents today for follow-up. She notes that she had a significant fall back in February she's had worsening abdominal cramping. She fell "flat on my belly". Seen in the emergency department and had lumbar and knee films done. Complains of abdominal cramping about every other day. Nausea has worsened but no vomiting. Abdominal cramping often leads to loose bowel movement. Denies any nocturnal symptoms. No blood in the stool or melena. Heartburn well-controlled on protonic's. Continues to have irregular menstrual cycles. Takes medroxyprogesterone 5 days every month to try to facilitate menstrual bleeding. She notes that this not been very successful. Last menstrual cycle in February was very light. Urine and serum pregnancy test negative while in the emergency department. Denies dysphagia/odynophagia.   Given naproxen while in the emergency department recently after her fall. She took for couple days but had to stop due to GI upset. Sometimes takes Aleve 2 at a time but not daily.   Current Outpatient Prescriptions  Medication Sig Dispense Refill  . acetaminophen (TYLENOL) 500 MG tablet Take 1,000 mg by mouth every 6 (six) hours as needed. pain    . albuterol (PROVENTIL HFA;VENTOLIN HFA) 108 (90 BASE) MCG/ACT inhaler Inhale 1-2 puffs into the lungs every 6 (six) hours as needed for wheezing. 1 Inhaler 0  . buPROPion (WELLBUTRIN XL) 300 MG 24 hr  tablet Take 300 mg by mouth daily.  3  . busPIRone (BUSPAR) 10 MG tablet Take 15 mg by mouth 2 (two) times daily. For anxiety    . furosemide (LASIX) 40 MG tablet   0  . loratadine (CLARITIN) 10 MG tablet Take 10 mg by mouth daily.    . methocarbamol (ROBAXIN) 750 MG tablet   0  . mometasone (ASMANEX) 220 MCG/INH inhaler Inhale 2 puffs into the lungs daily.    . montelukast (SINGULAIR) 10 MG tablet Take 10 mg by mouth at bedtime.    . pantoprazole (PROTONIX) 40 MG tablet TAKE 1 TABLET BY MOUTH EVERY DAY 30 tablet 5  . potassium chloride (K-DUR) 10 MEQ tablet Take 1 tablet (10 mEq total) by mouth 2 (two) times daily. For potassium replacement (Lasix causes potassium loss) 60 tablet 0  . Prenatal Vit-Fe Fumarate-FA (PRENATAL MULTIVITAMIN) TABS Take 1 tablet by mouth daily at 12 noon.    Marland Kitchen rOPINIRole (REQUIP) 0.5 MG tablet   5  . vitamin E 400 UNIT capsule Take 400 Units by mouth daily.    . ziprasidone (GEODON) 60 MG capsule Take 60 mg by mouth every evening.      No current facility-administered medications for this visit.    Allergies as of 07/14/2014 - Review Complete 07/14/2014  Allergen Reaction Noted  . Bee venom Shortness Of Breath 10/28/2011  . Nutritional supplements Anaphylaxis 06/29/2011  . Penicillins Anaphylaxis 05/12/2006  . Adhesive [tape] Rash 07/31/2011  . Latex Rash 05/17/2014    ROS:  General: Negative for anorexia, weight loss, fever, chills, fatigue, weakness. ENT: Negative for hoarseness, difficulty swallowing , nasal congestion. CV:  Negative for chest pain, angina, palpitations, dyspnea on exertion, peripheral edema.  Respiratory: Negative for dyspnea at rest, dyspnea on exertion, cough, sputum, wheezing.  GI: See history of present illness. GU:  Negative for dysuria, hematuria, urinary incontinence, urinary frequency, nocturnal urination. See hpi Endo: Negative for unusual weight change.    Physical Examination:   BP 134/83 mmHg  Pulse 73  Temp(Src) 98.2  F (36.8 C) (Oral)  Ht 5\' 6"  (1.676 m)  Wt 454 lb (205.933 kg)  BMI 73.31 kg/m2  General: Well-nourished, well-developed in no acute distress.  Eyes: No icterus. Mouth: Oropharyngeal mucosa moist and pink , no lesions erythema or exudate. Lungs: Clear to auscultation bilaterally.  Heart: Regular rate and rhythm, no murmurs rubs or gallops.  Abdomen: Bowel sounds are normal, nontender, nondistended, no hepatosplenomegaly or masses, no abdominal bruits or hernia , no rebound or guarding.   Extremities: No lower extremity edema. No clubbing or deformities. Neuro: Alert and oriented x 4   Skin: Warm and dry, no jaundice.   Psych: Alert and cooperative, normal mood and affect.

## 2014-07-14 NOTE — Progress Notes (Signed)
cc'ed to pcp °

## 2014-07-15 DIAGNOSIS — M546 Pain in thoracic spine: Secondary | ICD-10-CM | POA: Diagnosis not present

## 2014-07-15 DIAGNOSIS — M9902 Segmental and somatic dysfunction of thoracic region: Secondary | ICD-10-CM | POA: Diagnosis not present

## 2014-07-15 DIAGNOSIS — M545 Low back pain: Secondary | ICD-10-CM | POA: Diagnosis not present

## 2014-07-15 DIAGNOSIS — M9903 Segmental and somatic dysfunction of lumbar region: Secondary | ICD-10-CM | POA: Diagnosis not present

## 2014-07-15 LAB — CBC WITH DIFFERENTIAL/PLATELET
BASOS PCT: 0 % (ref 0–1)
Basophils Absolute: 0 10*3/uL (ref 0.0–0.1)
Eosinophils Absolute: 0.4 10*3/uL (ref 0.0–0.7)
Eosinophils Relative: 3 % (ref 0–5)
HCT: 38.8 % (ref 36.0–46.0)
Hemoglobin: 12.6 g/dL (ref 12.0–15.0)
Lymphocytes Relative: 23 % (ref 12–46)
Lymphs Abs: 2.9 10*3/uL (ref 0.7–4.0)
MCH: 26.9 pg (ref 26.0–34.0)
MCHC: 32.5 g/dL (ref 30.0–36.0)
MCV: 82.7 fL (ref 78.0–100.0)
MONOS PCT: 7 % (ref 3–12)
MPV: 10.9 fL (ref 8.6–12.4)
Monocytes Absolute: 0.9 10*3/uL (ref 0.1–1.0)
NEUTROS PCT: 67 % (ref 43–77)
Neutro Abs: 8.3 10*3/uL — ABNORMAL HIGH (ref 1.7–7.7)
Platelets: 417 10*3/uL — ABNORMAL HIGH (ref 150–400)
RBC: 4.69 MIL/uL (ref 3.87–5.11)
RDW: 14.7 % (ref 11.5–15.5)
WBC: 12.4 10*3/uL — AB (ref 4.0–10.5)

## 2014-07-16 LAB — COMPREHENSIVE METABOLIC PANEL
ALBUMIN: 3.9 g/dL (ref 3.5–5.2)
ALT: 14 U/L (ref 0–35)
AST: 14 U/L (ref 0–37)
Alkaline Phosphatase: 57 U/L (ref 39–117)
BUN: 11 mg/dL (ref 6–23)
CO2: 27 mEq/L (ref 19–32)
Calcium: 9.3 mg/dL (ref 8.4–10.5)
Chloride: 102 mEq/L (ref 96–112)
Creat: 0.7 mg/dL (ref 0.50–1.10)
Glucose, Bld: 62 mg/dL — ABNORMAL LOW (ref 70–99)
POTASSIUM: 4.5 meq/L (ref 3.5–5.3)
SODIUM: 139 meq/L (ref 135–145)
Total Bilirubin: 0.3 mg/dL (ref 0.2–1.2)
Total Protein: 7.2 g/dL (ref 6.0–8.3)

## 2014-07-19 ENCOUNTER — Other Ambulatory Visit: Payer: Self-pay

## 2014-07-19 DIAGNOSIS — D72829 Elevated white blood cell count, unspecified: Secondary | ICD-10-CM

## 2014-07-19 NOTE — Progress Notes (Signed)
Quick Note:  Pt is aware of results. OK to refer to hematology and she would like for it to be at [name]. ______

## 2014-07-19 NOTE — Progress Notes (Signed)
Quick Note:  Labs don't indicate any type of injury to abdomen. She does have chronically elevated white blood cell count dating back several years. Likely insignificant but would like for her to see a hematologist (blood specialist).   Continue trial of Bentyl and progress report in few weeks.  Message sent via [place]. ______

## 2014-07-29 DIAGNOSIS — F3341 Major depressive disorder, recurrent, in partial remission: Secondary | ICD-10-CM | POA: Diagnosis not present

## 2014-07-29 DIAGNOSIS — F064 Anxiety disorder due to known physiological condition: Secondary | ICD-10-CM | POA: Diagnosis not present

## 2014-08-01 ENCOUNTER — Encounter (HOSPITAL_COMMUNITY): Payer: Self-pay | Admitting: Hematology & Oncology

## 2014-08-01 ENCOUNTER — Other Ambulatory Visit (HOSPITAL_COMMUNITY): Payer: Self-pay | Admitting: Hematology & Oncology

## 2014-08-01 ENCOUNTER — Encounter (HOSPITAL_COMMUNITY): Payer: Medicare Other | Attending: Hematology & Oncology | Admitting: Hematology & Oncology

## 2014-08-01 VITALS — BP 144/84 | HR 82 | Temp 98.9°F | Resp 20 | Ht 63.0 in | Wt >= 6400 oz

## 2014-08-01 DIAGNOSIS — D473 Essential (hemorrhagic) thrombocythemia: Secondary | ICD-10-CM | POA: Diagnosis not present

## 2014-08-01 DIAGNOSIS — D75839 Thrombocytosis, unspecified: Secondary | ICD-10-CM

## 2014-08-01 DIAGNOSIS — D72829 Elevated white blood cell count, unspecified: Secondary | ICD-10-CM | POA: Diagnosis not present

## 2014-08-01 LAB — COMPREHENSIVE METABOLIC PANEL
ALBUMIN: 3.8 g/dL (ref 3.5–5.2)
ALT: 20 U/L (ref 0–35)
AST: 19 U/L (ref 0–37)
Alkaline Phosphatase: 59 U/L (ref 39–117)
Anion gap: 5 (ref 5–15)
BUN: 13 mg/dL (ref 6–23)
CHLORIDE: 105 mmol/L (ref 96–112)
CO2: 26 mmol/L (ref 19–32)
CREATININE: 0.75 mg/dL (ref 0.50–1.10)
Calcium: 9.2 mg/dL (ref 8.4–10.5)
Glucose, Bld: 86 mg/dL (ref 70–99)
POTASSIUM: 4 mmol/L (ref 3.5–5.1)
Sodium: 136 mmol/L (ref 135–145)
TOTAL PROTEIN: 7.7 g/dL (ref 6.0–8.3)
Total Bilirubin: 0.1 mg/dL — ABNORMAL LOW (ref 0.3–1.2)

## 2014-08-01 LAB — CBC WITH DIFFERENTIAL/PLATELET
BASOS ABS: 0 10*3/uL (ref 0.0–0.1)
Basophils Relative: 0 % (ref 0–1)
EOS PCT: 3 % (ref 0–5)
Eosinophils Absolute: 0.4 10*3/uL (ref 0.0–0.7)
HCT: 39.6 % (ref 36.0–46.0)
Hemoglobin: 12.3 g/dL (ref 12.0–15.0)
LYMPHS ABS: 2.8 10*3/uL (ref 0.7–4.0)
Lymphocytes Relative: 20 % (ref 12–46)
MCH: 27.2 pg (ref 26.0–34.0)
MCHC: 31.1 g/dL (ref 30.0–36.0)
MCV: 87.4 fL (ref 78.0–100.0)
Monocytes Absolute: 0.7 10*3/uL (ref 0.1–1.0)
Monocytes Relative: 5 % (ref 3–12)
NEUTROS ABS: 10.5 10*3/uL — AB (ref 1.7–7.7)
Neutrophils Relative %: 72 % (ref 43–77)
PLATELETS: 416 10*3/uL — AB (ref 150–400)
RBC: 4.53 MIL/uL (ref 3.87–5.11)
RDW: 15.1 % (ref 11.5–15.5)
WBC: 14.4 10*3/uL — AB (ref 4.0–10.5)

## 2014-08-01 LAB — LACTATE DEHYDROGENASE: LDH: 188 U/L (ref 94–250)

## 2014-08-01 NOTE — Progress Notes (Signed)
Heather [name] presented for labwork. Labs per MD order drawn via Peripheral Line 21 gauge needle inserted in left lateral forearm.  Good blood return present. Procedure without incident.  Needle removed intact. Patient tolerated procedure well.

## 2014-08-01 NOTE — Patient Instructions (Addendum)
[place] at [facility] Discharge Instructions  RECOMMENDATIONS MADE BY THE CONSULTANT AND ANY TEST RESULTS WILL BE SENT TO YOUR REFERRING PHYSICIAN.  Return in 3 weeks as scheduled for office visit.  Thank you for choosing [facility] to provide your oncology and hematology care.  To afford each patient quality time with our provider, please arrive at least 15 minutes before your scheduled appointment time.    You need to re-schedule your appointment should you arrive 10 or more minutes late.  We strive to give you quality time with our providers, and arriving late affects you and other patients whose appointments are after yours.  Also, if you no show three or more times for appointments you may be dismissed from the clinic at the providers discretion.     Again, thank you for choosing [facility].  Our hope is that these requests will decrease the amount of time that you wait before being seen by our physicians.       _____________________________________________________________  Should you have questions after your visit to [facility], please contact our office at [phone] between the hours of 8:30 a.m. and 4:30 p.m.  Voicemails left after 4:30 p.m. will not be returned until the following business day.  For prescription refill requests, have your pharmacy contact our office.

## 2014-08-04 LAB — P190 BCR-ABL 1: P190 BCR ABL1: NOT DETECTED

## 2014-08-04 LAB — JAK2 GENOTYPR

## 2014-08-04 LAB — BCR/ABL GENE REARRANGEMENT QNT, PCR
BCR ABL1 / ABL1 IS: 0 %
BCR ABL1/ABL1: 0 %

## 2014-08-04 LAB — P210 BCR-ABL 1: P210 BCR ABL1: NOT DETECTED

## 2014-08-07 NOTE — Progress Notes (Signed)
[facility] CONSULT NOTE  Patient Care Team: Lucianne Lei, MD as PCP - General (Family Medicine) Danie Binder, MD as Consulting Physician (Gastroenterology)  CHIEF COMPLAINTS/PURPOSE OF CONSULTATION:  Leukocytosis  HISTORY OF PRESENTING ILLNESS:  Heather [name] 39 y.o. female is here because of leukocytosis.  She has a history of eosinophilic esophagitis and reactive gastropathy diagnosed at [facility] in 2015. Review of her records in EPIC thosed a persistently mild elevation in her total WBC dating back several years ago. She has also had a mild intermittent elevation in her platelets into the low 400 K range.  07/28/2011  Total wbc of 11.2  08/21/2011  Total wbc of 13.3 02/18/2013  Total wbc of 13.9 with a mild neutrophilia, remainder of differential is unremarkable 01/25/2014  Total wbc of 12.4 with a mild neutrophilia, remainder of differential is unremarkable.  She denies night sweats, fever or chills. She notes her previous swallowing difficulties are resolved. She denies chronic allergies.  MEDICAL HISTORY:  Past Medical History  Diagnosis Date  . Amenorrhea   . Dysmenorrhea   . COPD (chronic obstructive pulmonary disease)   . Dysrhythmia     DR Johnsie Cancel    . Asthma   . Sleep apnea     CPAP  . Shortness of breath     WITH EXERTION   . GERD (gastroesophageal reflux disease)     HEARTBURN   TUMS  . Neuromuscular disorder     RESTLESS LEG   . Depression   . Anxiety   . Obesity   . Morbid obesity   . Ectopic pregnancy 2013  . Eosinophilic esophagitis     Diagnosed at [facility] 06/16/2013, untreated  . Depression   . Anxiety     SURGICAL HISTORY: Past Surgical History  Procedure Laterality Date  . Cholecystectomy    . Tonsillectomy    . Tooth extraction  10/28/2011    Procedure: DENTAL RESTORATION/EXTRACTIONS;  Surgeon: Gae Bon, DDS;  Location: [facility];  Service: Oral Surgery;;  . Dental surgery    . Esophagogastroduodenoscopy  May 2007   Dr. Gala Romney: Normal esophagus, stomach, D1, D2  . Esophagogastroduodenoscopy  06/16/2013    Dr. Carlton Adam, eosinophilic esophagitis, reactive gastropathy, no esophageal dilation    SOCIAL HISTORY: History   Social History  . Marital Status: Single    Spouse Name: N/A  . Number of Children: 0  . Years of Education: N/A   Occupational History  . volunteer    Social History Main Topics  . Smoking status: Former Smoker -- 0.50 packs/day for 8 years    Types: Cigarettes    Quit date: 04/25/2011  . Smokeless tobacco: Never Used  . Alcohol Use: No     Comment: occasional   . Drug Use: No  . Sexual Activity: Not Currently    Birth Control/ Protection: None     Comment: stopped smoking in jan.. had a pack the other day   Other Topics Concern  . 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 She has no children. She has had one tubal pregnancy. She is not married but in a relationship. She quit smoking 6 years ago and smoked one ppd from the age of [age].  She used to drink ETOH but quit in 2010.  FAMILY HISTORY: Family History  Problem Relation Age of Onset  . Depression Mother   .  Hypertension Sister   . Diabetes Paternal Uncle   . Anxiety disorder Paternal Uncle   . Colon cancer Maternal Grandmother     71s  . Liver disease Neg Hx    indicated that her mother is alive. She indicated that her sister is alive.   Mother is alive at 11. She has "stomach issues"  Her father died in his 70's from AIDS. Her maternal grandmother had colon cancer.  She did not die from her disease.   ALLERGIES:  is allergic to bee venom; nutritional supplements; penicillins; adhesive; and latex.  MEDICATIONS:  Current Outpatient Prescriptions  Medication Sig Dispense Refill  . acetaminophen (TYLENOL) 500 MG tablet Take 1,000 mg by mouth every 6 (six) hours as needed. pain    . albuterol (PROVENTIL  HFA;VENTOLIN HFA) 108 (90 BASE) MCG/ACT inhaler Inhale 1-2 puffs into the lungs every 6 (six) hours as needed for wheezing. 1 Inhaler 0  . buPROPion (WELLBUTRIN XL) 300 MG 24 hr tablet Take 300 mg by mouth daily.  3  . busPIRone (BUSPAR) 10 MG tablet Take 15 mg by mouth 2 (two) times daily. For anxiety    . dicyclomine (BENTYL) 20 MG tablet Take 1 tablet (20 mg total) by mouth 3 (three) times daily before meals. 90 tablet 0  . furosemide (LASIX) 40 MG tablet   0  . loratadine (CLARITIN) 10 MG tablet Take 10 mg by mouth daily.    . medroxyPROGESTERone (PROVERA) 10 MG tablet 2 tablets daily. For five days each month    . metFORMIN (GLUCOPHAGE) 500 MG tablet Take 500 mg by mouth daily with breakfast.    . methocarbamol (ROBAXIN) 750 MG tablet   0  . mometasone (ASMANEX) 220 MCG/INH inhaler Inhale 2 puffs into the lungs daily.    . montelukast (SINGULAIR) 10 MG tablet Take 10 mg by mouth at bedtime.    . pantoprazole (PROTONIX) 40 MG tablet TAKE 1 TABLET BY MOUTH EVERY DAY 30 tablet 5  . potassium chloride (K-DUR) 10 MEQ tablet Take 1 tablet (10 mEq total) by mouth 2 (two) times daily. For potassium replacement (Lasix causes potassium loss) 60 tablet 0  . Prenatal Vit-Fe Fumarate-FA (PRENATAL MULTIVITAMIN) TABS Take 1 tablet by mouth daily at 12 noon.    Marland Kitchen rOPINIRole (REQUIP) 0.5 MG tablet   5  . vitamin E 400 UNIT capsule Take 400 Units by mouth daily.    . ziprasidone (GEODON) 60 MG capsule Take 60 mg by mouth every evening.      No current facility-administered medications for this visit.    Review of Systems  Constitutional: Negative.   HENT: Negative.   Eyes: Negative.   Respiratory: Positive for shortness of breath.   Cardiovascular: Negative.   Gastrointestinal: Positive for nausea.  Skin: Negative.   Neurological: Negative.   Endo/Heme/Allergies: Negative.   Psychiatric/Behavioral: Negative.     PHYSICAL EXAMINATION: ECOG PERFORMANCE STATUS: 1 - Symptomatic but completely  ambulatory  Filed Vitals:   08/01/14 1545  BP: 144/84  Pulse: 82  Temp:   Resp: 20   Filed Weights   08/01/14 1427  Weight: 456 lb (206.84 kg)     Physical Exam  Constitutional: She is oriented to person, place, and time and well-developed, well-nourished, and in no distress.  Morbidly Obese  HENT:  Head: Normocephalic and atraumatic.  Nose: Nose normal.  Mouth/Throat: Oropharynx is clear and moist. No oropharyngeal exudate.  Eyes: Conjunctivae and EOM are normal. Pupils are equal, round, and reactive to light.  Right eye exhibits no discharge. Left eye exhibits no discharge. No scleral icterus.  Neck: Normal range of motion. Neck supple. No tracheal deviation present. No thyromegaly present.  Cardiovascular: Normal rate, regular rhythm and normal heart sounds.  Exam reveals no gallop and no friction rub.   No murmur heard. Pulmonary/Chest: Effort normal and breath sounds normal. She has no wheezes. She has no rales.  Abdominal: Soft. Bowel sounds are normal. She exhibits no distension and no mass. There is no tenderness. There is no rebound and no guarding.  Exam limited by obesity   Musculoskeletal: Normal range of motion. She exhibits no edema.  Lymphadenopathy:    She has no cervical adenopathy.  Neurological: She is alert and oriented to person, place, and time. She has normal reflexes. No cranial nerve deficit. Gait normal. Coordination normal.  Skin: Skin is warm and dry. No rash noted. She is not diaphoretic.  Psychiatric: Mood, memory, affect and judgment normal.  Nursing note and vitals reviewed.    LABORATORY DATA:  I have reviewed the data as listed Lab Results  Component Value Date   WBC 14.4* 08/01/2014   HGB 12.3 08/01/2014   HCT 39.6 08/01/2014   MCV 87.4 08/01/2014   PLT 416* 08/01/2014     Chemistry      Component Value Date/Time   NA 136 08/01/2014 1540   K 4.0 08/01/2014 1540   CL 105 08/01/2014 1540   CO2 26 08/01/2014 1540   BUN 13  08/01/2014 1540   CREATININE 0.75 08/01/2014 1540   CREATININE 0.70 07/14/2014 1451      Component Value Date/Time   CALCIUM 9.2 08/01/2014 1540   ALKPHOS 59 08/01/2014 1540   AST 19 08/01/2014 1540   ALT 20 08/01/2014 1540   BILITOT 0.1* 08/01/2014 1540       ASSESSMENT & PLAN:  LEUKOCYTOSIS NEUTROPHILIA THROMBOCYTOSIS  39 year old female with a history of eosinophilic esophagitis who presents for further evaluation of chronic leukocytosis/neutrophilia.She has also had intermittent thrombocytosis.   I suspect the findings are reactive or secondary to inflammation/infllamatory conditition.  I have recommended however additional peripheral evaluation including CBC with peripheral smear review, JAK2 mutation and BCR-abl. If the above are unremarkable we will check inflammatory markers such as ESR and CRP at her next follow-up   Orders Placed This Encounter  Procedures  . CBC with Differential    Standing Status: Future     Number of Occurrences: 1     Standing Expiration Date: 08/01/2015  . Comprehensive metabolic panel    Standing Status: Future     Number of Occurrences: 1     Standing Expiration Date: 08/01/2015  . Lactate dehydrogenase    Standing Status: Future     Number of Occurrences: 1     Standing Expiration Date: 08/01/2015  . JAK2 genotypr    Standing Status: Future     Number of Occurrences: 1     Standing Expiration Date: 08/01/2015  . Bcr/abl gene rearrangement qnt, PCR    Standing Status: Future     Number of Occurrences: 1     Standing Expiration Date: 08/01/2015  . P190 BCR-ABL 1  . P210 BCR-ABL 1   I discussed my recommendations in detail and she understands and agrees with the plan as detailed.  All questions were answered. The patient knows to call the clinic with any problems, questions or concerns.  This note was electronically signed.    Molli Hazard, MD  08/07/2014 3:02 PM

## 2014-08-21 ENCOUNTER — Encounter: Payer: Self-pay | Admitting: Gastroenterology

## 2014-08-23 ENCOUNTER — Encounter (HOSPITAL_COMMUNITY): Payer: Self-pay | Admitting: Hematology & Oncology

## 2014-08-23 ENCOUNTER — Encounter (HOSPITAL_COMMUNITY): Payer: Medicare Other | Attending: Hematology & Oncology | Admitting: Hematology & Oncology

## 2014-08-23 VITALS — BP 144/80 | HR 97 | Temp 97.7°F | Resp 20 | Wt >= 6400 oz

## 2014-08-23 DIAGNOSIS — D72829 Elevated white blood cell count, unspecified: Secondary | ICD-10-CM | POA: Diagnosis not present

## 2014-08-23 DIAGNOSIS — K200 Eosinophilic esophagitis: Secondary | ICD-10-CM | POA: Diagnosis not present

## 2014-08-23 DIAGNOSIS — D473 Essential (hemorrhagic) thrombocythemia: Secondary | ICD-10-CM | POA: Insufficient documentation

## 2014-08-23 DIAGNOSIS — D75839 Thrombocytosis, unspecified: Secondary | ICD-10-CM

## 2014-08-23 LAB — CBC WITH DIFFERENTIAL/PLATELET
Basophils Absolute: 0 10*3/uL (ref 0.0–0.1)
Basophils Relative: 0 % (ref 0–1)
Eosinophils Absolute: 0.4 10*3/uL (ref 0.0–0.7)
Eosinophils Relative: 3 % (ref 0–5)
HCT: 39.1 % (ref 36.0–46.0)
HEMOGLOBIN: 12.1 g/dL (ref 12.0–15.0)
LYMPHS PCT: 19 % (ref 12–46)
Lymphs Abs: 2.6 10*3/uL (ref 0.7–4.0)
MCH: 26.8 pg (ref 26.0–34.0)
MCHC: 30.9 g/dL (ref 30.0–36.0)
MCV: 86.7 fL (ref 78.0–100.0)
Monocytes Absolute: 0.8 10*3/uL (ref 0.1–1.0)
Monocytes Relative: 6 % (ref 3–12)
NEUTROS PCT: 72 % (ref 43–77)
Neutro Abs: 10 10*3/uL — ABNORMAL HIGH (ref 1.7–7.7)
Platelets: 468 10*3/uL — ABNORMAL HIGH (ref 150–400)
RBC: 4.51 MIL/uL (ref 3.87–5.11)
RDW: 14.9 % (ref 11.5–15.5)
WBC: 13.7 10*3/uL — ABNORMAL HIGH (ref 4.0–10.5)

## 2014-08-23 LAB — SEDIMENTATION RATE: Sed Rate: 51 mm/hr — ABNORMAL HIGH (ref 0–22)

## 2014-08-23 NOTE — Patient Instructions (Signed)
[place] at [facility] Discharge Instructions  RECOMMENDATIONS MADE BY THE CONSULTANT AND ANY TEST RESULTS WILL BE SENT TO YOUR REFERRING PHYSICIAN.  Follow up in 89months. You saw Dr. Whitney Muse today. Call for any concerns or questions.  Thank you for choosing [place] at [facility] to provide your oncology and hematology care.  To afford each patient quality time with our provider, please arrive at least 15 minutes before your scheduled appointment time.    You need to re-schedule your appointment should you arrive 10 or more minutes late.  We strive to give you quality time with our providers, and arriving late affects you and other patients whose appointments are after yours.  Also, if you no show three or more times for appointments you may be dismissed from the clinic at the providers discretion.     Again, thank you for choosing [facility].  Our hope is that these requests will decrease the amount of time that you wait before being seen by our physicians.       _____________________________________________________________  Should you have questions after your visit to [facility], please contact our office at [phone] between the hours of 8:30 a.m. and 4:30 p.m.  Voicemails left after 4:30 p.m. will not be returned until the following business day.  For prescription refill requests, have your pharmacy contact our office.

## 2014-08-23 NOTE — Progress Notes (Signed)
[facility] CONSULT NOTE  Patient Care Team: Lucianne Lei, MD as PCP - General (Family Medicine) Danie Binder, MD as Consulting Physician (Gastroenterology)  CHIEF COMPLAINTS/PURPOSE OF CONSULTATION:  Leukocytosis  HISTORY OF PRESENTING ILLNESS:  Heather [name] 39 y.o. female is here because of leukocytosis.  She has a history of eosinophilic esophagitis and reactive gastropathy diagnosed at [facility] in 2015. Review of her records in EPIC thosed a persistently mild elevation in her total WBC dating back several years ago. She has also had a mild intermittent elevation in her platelets into the low 400 K range.  07/28/2011  Total wbc of 11.2  08/21/2011  Total wbc of 13.3 02/18/2013  Total wbc of 13.9 with a mild neutrophilia, remainder of differential is unremarkable 01/25/2014  Total wbc of 12.4 with a mild neutrophilia, remainder of differential is unremarkable.  She reports abdominal cramping. No pain while eating. She says "her nose runs like a faucet"   She sees an allergist, Orinda Kenner who manages her symptoms. She was found to be allergic to dust mites.  MEDICAL HISTORY:  Past Medical History  Diagnosis Date  . Amenorrhea   . Dysmenorrhea   . COPD (chronic obstructive pulmonary disease)   . Dysrhythmia     DR Johnsie Cancel    . Asthma   . Sleep apnea     CPAP  . Shortness of breath     WITH EXERTION   . GERD (gastroesophageal reflux disease)     HEARTBURN   TUMS  . Neuromuscular disorder     RESTLESS LEG   . Depression   . Anxiety   . Obesity   . Morbid obesity   . Ectopic pregnancy 2013  . Eosinophilic esophagitis     Diagnosed at [facility] 06/16/2013, untreated  . Depression   . Anxiety     SURGICAL HISTORY: Past Surgical History  Procedure Laterality Date  . Cholecystectomy    . Tonsillectomy    . Tooth extraction  10/28/2011    Procedure: DENTAL RESTORATION/EXTRACTIONS;  Surgeon: Gae Bon, DDS;  Location: [place];  Service: Oral  Surgery;;  . Dental surgery    . Esophagogastroduodenoscopy  May 2007    Dr. Gala Romney: Normal esophagus, stomach, D1, D2  . Esophagogastroduodenoscopy  06/16/2013    Dr. Carlton Adam, eosinophilic esophagitis, reactive gastropathy, no esophageal dilation    SOCIAL HISTORY: History   Social History  . Marital Status: Single    Spouse Name: N/A  . Number of Children: 0  . Years of Education: N/A   Occupational History  . volunteer    Social History Main Topics  . Smoking status: Former Smoker -- 0.50 packs/day for 8 years    Types: Cigarettes    Quit date: 04/25/2011  . Smokeless tobacco: Never Used  . Alcohol Use: No     Comment: occasional   . Drug Use: No  . Sexual Activity: Not Currently    Birth Control/ Protection: None     Comment: stopped smoking in jan.. had a pack the other day   Other Topics Concern  . 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 She has no children. She has had one tubal pregnancy. She is not married but in a relationship. She quit smoking 6 years ago and smoked one ppd from the age of [age].  She used to drink ETOH but  quit in 2010.  FAMILY HISTORY: Family History  Problem Relation Age of Onset  . Depression Mother   . Hypertension Sister   . Diabetes Paternal Uncle   . Anxiety disorder Paternal Uncle   . Colon cancer Maternal Grandmother     36s  . Liver disease Neg Hx    indicated that her mother is alive. She indicated that her sister is alive.   Mother is alive at 46. She has "stomach issues"  Her father died in his 75's from AIDS. Her maternal grandmother had colon cancer.  She did not die from her disease.   ALLERGIES:  is allergic to bee venom; nutritional supplements; penicillins; adhesive; and latex.  MEDICATIONS:  Current Outpatient Prescriptions  Medication Sig Dispense Refill  . acetaminophen (TYLENOL) 500 MG tablet Take 1,000 mg by  mouth every 6 (six) hours as needed. pain    . albuterol (PROVENTIL HFA;VENTOLIN HFA) 108 (90 BASE) MCG/ACT inhaler Inhale 1-2 puffs into the lungs every 6 (six) hours as needed for wheezing. 1 Inhaler 0  . buPROPion (WELLBUTRIN XL) 300 MG 24 hr tablet Take 300 mg by mouth daily.  3  . busPIRone (BUSPAR) 10 MG tablet Take 15 mg by mouth 2 (two) times daily. For anxiety    . furosemide (LASIX) 40 MG tablet   0  . loratadine (CLARITIN) 10 MG tablet Take 10 mg by mouth daily.    . metFORMIN (GLUCOPHAGE) 500 MG tablet Take 500 mg by mouth daily with breakfast.    . mometasone (ASMANEX) 220 MCG/INH inhaler Inhale 2 puffs into the lungs daily.    . montelukast (SINGULAIR) 10 MG tablet Take 10 mg by mouth at bedtime.    . pantoprazole (PROTONIX) 40 MG tablet TAKE 1 TABLET BY MOUTH EVERY DAY 30 tablet 5  . potassium chloride (K-DUR) 10 MEQ tablet Take 1 tablet (10 mEq total) by mouth 2 (two) times daily. For potassium replacement (Lasix causes potassium loss) 60 tablet 0  . Prenatal Vit-Fe Fumarate-FA (PRENATAL MULTIVITAMIN) TABS Take 1 tablet by mouth daily at 12 noon.    Marland Kitchen rOPINIRole (REQUIP) 0.5 MG tablet   5  . vitamin E 400 UNIT capsule Take 400 Units by mouth daily.    . ziprasidone (GEODON) 60 MG capsule Take 60 mg by mouth every evening.     . dicyclomine (BENTYL) 20 MG tablet Take 1 tablet (20 mg total) by mouth 3 (three) times daily before meals. As needed for abdominal cramps or diarrhea 90 tablet 2  . medroxyPROGESTERone (PROVERA) 10 MG tablet 2 tablets daily. For five days each month    . methocarbamol (ROBAXIN) 750 MG tablet   0   No current facility-administered medications for this visit.    Review of Systems  Constitutional: Negative.   HENT: Negative.   Eyes: Negative.   Respiratory: Negative Cardiovascular: Negative.   Gastrointestinal: Negative Skin: Negative.   Neurological: Negative.   Endo/Heme/Allergies: Negative.   Psychiatric/Behavioral: Negative.   14 point ROS  performed and as documented above and under HPI  PHYSICAL EXAMINATION: ECOG PERFORMANCE STATUS: 1 - Symptomatic but completely ambulatory  Filed Vitals:   08/23/14 1300  BP: 144/80  Pulse: 97  Temp: 97.7 F (36.5 C)  Resp: 20   Filed Weights   08/23/14 1300  Weight: 461 lb 3.2 oz (209.199 kg)     Physical Exam  Constitutional: She is oriented to person, place, and time and well-developed, well-nourished, and in no distress.  Morbidly Obese  HENT:  Head: Normocephalic and atraumatic.  Nose: Nose normal.  Mouth/Throat: Oropharynx is clear and moist. No oropharyngeal exudate.  Eyes: Conjunctivae and EOM are normal. Pupils are equal, round, and reactive to light. Right eye exhibits no discharge. Left eye exhibits no discharge. No scleral icterus.  Neck: Normal range of motion. Neck supple. No tracheal deviation present. No thyromegaly present.  Cardiovascular: Normal rate, regular rhythm and normal heart sounds.  Exam reveals no gallop and no friction rub.   No murmur heard. Pulmonary/Chest: Effort normal and breath sounds normal. She has no wheezes. She has no rales.  Abdominal: Soft. Bowel sounds are normal. She exhibits no distension and no mass. There is no tenderness. There is no rebound and no guarding.  Exam limited by obesity   Musculoskeletal: Normal range of motion. She exhibits no edema.  Lymphadenopathy:    She has no cervical adenopathy.  Neurological: She is alert and oriented to person, place, and time. She has normal reflexes. No cranial nerve deficit. Gait normal. Coordination normal.  Skin: Skin is warm and dry. No rash noted. She is not diaphoretic.  Psychiatric: Mood, memory, affect and judgment normal.  Nursing note and vitals reviewed.    LABORATORY DATA:  I have reviewed the data as listed Lab Results  Component Value Date   WBC 13.7* 08/23/2014   HGB 12.1 08/23/2014   HCT 39.1 08/23/2014   MCV 86.7 08/23/2014   PLT 468* 08/23/2014      Chemistry      Component Value Date/Time   NA 136 08/01/2014 1540   K 4.0 08/01/2014 1540   CL 105 08/01/2014 1540   CO2 26 08/01/2014 1540   BUN 13 08/01/2014 1540   CREATININE 0.75 08/01/2014 1540   CREATININE 0.70 07/14/2014 1451      Component Value Date/Time   CALCIUM 9.2 08/01/2014 1540   ALKPHOS 59 08/01/2014 1540   AST 19 08/01/2014 1540   ALT 20 08/01/2014 1540   BILITOT 0.1* 08/01/2014 1540       ASSESSMENT & PLAN:  LEUKOCYTOSIS NEUTROPHILIA THROMBOCYTOSIS JAK 2 negative BCR ABL negative  ESR 86  39 year old female with a history of eosinophilic esophagitis who presents for further evaluation of chronic leukocytosis/neutrophilia.She has also had intermittent thrombocytosis. She has a history of allergies and has seen an allergist in the past.  I reviewed the studies we sent at her last visit and all of them are negative. I suspect the findings are reactive or secondary to inflammation/infllamatory conditition.    I have recommended ongoing observation for now. She will follow-up in 4 months with repeat laboratory studies.   Orders Placed This Encounter  Procedures  . JAK-2 Exon 12  . Sedimentation rate  . CBC with Differential/Platelet  . Miscellaneous test    JAK2 EXON12    Standing Status: Future     Number of Occurrences: 1     Standing Expiration Date: 08/23/2015   I discussed my recommendations in detail and she understands and agrees with the plan as detailed.  All questions were answered. The patient knows to call the clinic with any problems, questions or concerns.  This note was electronically signed.   This document serves as a record of services personally performed by Ancil Linsey, MD. It was created on her behalf by Pearlie Oyster, a trained medical scribe. The creation of this record is based on the scribe's personal observations and the provider's statements to them. This document has been checked and approved by the  attending provider.     I have reviewed the above documentation for accuracy and completeness, and I agree with the above.  Kelby Fam. Natahlia Hoggard MD

## 2014-08-25 ENCOUNTER — Other Ambulatory Visit: Payer: Self-pay | Admitting: Gastroenterology

## 2014-08-25 MED ORDER — DICYCLOMINE HCL 20 MG PO TABS: 20.0000 mg | ORAL_TABLET | Freq: Three times a day (TID) | ORAL | Status: DC

## 2014-08-27 LAB — MISCELLANEOUS TEST

## 2014-08-29 ENCOUNTER — Other Ambulatory Visit (HOSPITAL_COMMUNITY): Payer: Self-pay

## 2014-08-30 DIAGNOSIS — J309 Allergic rhinitis, unspecified: Secondary | ICD-10-CM | POA: Diagnosis not present

## 2014-08-30 DIAGNOSIS — J4530 Mild persistent asthma, uncomplicated: Secondary | ICD-10-CM | POA: Diagnosis not present

## 2014-09-14 DIAGNOSIS — H81393 Other peripheral vertigo, bilateral: Secondary | ICD-10-CM | POA: Diagnosis not present

## 2014-09-14 DIAGNOSIS — F064 Anxiety disorder due to known physiological condition: Secondary | ICD-10-CM | POA: Diagnosis not present

## 2014-09-14 DIAGNOSIS — F3341 Major depressive disorder, recurrent, in partial remission: Secondary | ICD-10-CM | POA: Diagnosis not present

## 2014-09-27 DIAGNOSIS — F332 Major depressive disorder, recurrent severe without psychotic features: Secondary | ICD-10-CM | POA: Diagnosis not present

## 2014-10-29 ENCOUNTER — Encounter (HOSPITAL_COMMUNITY): Payer: Self-pay | Admitting: *Deleted

## 2014-10-29 ENCOUNTER — Emergency Department (HOSPITAL_COMMUNITY)
Admission: EM | Admit: 2014-10-29 | Discharge: 2014-10-29 | Disposition: A | Discharge status: Home / Self Care | Payer: Medicare Other | Attending: Emergency Medicine | Admitting: Emergency Medicine

## 2014-10-29 DIAGNOSIS — Z880 Allergy status to penicillin: Secondary | ICD-10-CM | POA: Diagnosis not present

## 2014-10-29 DIAGNOSIS — G4730 Sleep apnea, unspecified: Secondary | ICD-10-CM | POA: Diagnosis not present

## 2014-10-29 DIAGNOSIS — L02211 Cutaneous abscess of abdominal wall: Secondary | ICD-10-CM | POA: Diagnosis not present

## 2014-10-29 DIAGNOSIS — K200 Eosinophilic esophagitis: Secondary | ICD-10-CM | POA: Diagnosis not present

## 2014-10-29 DIAGNOSIS — J449 Chronic obstructive pulmonary disease, unspecified: Secondary | ICD-10-CM | POA: Insufficient documentation

## 2014-10-29 DIAGNOSIS — Z8669 Personal history of other diseases of the nervous system and sense organs: Secondary | ICD-10-CM | POA: Diagnosis not present

## 2014-10-29 DIAGNOSIS — L0291 Cutaneous abscess, unspecified: Secondary | ICD-10-CM

## 2014-10-29 DIAGNOSIS — K219 Gastro-esophageal reflux disease without esophagitis: Secondary | ICD-10-CM | POA: Diagnosis not present

## 2014-10-29 DIAGNOSIS — R000 Tachycardia, unspecified: Secondary | ICD-10-CM | POA: Insufficient documentation

## 2014-10-29 DIAGNOSIS — F419 Anxiety disorder, unspecified: Secondary | ICD-10-CM | POA: Diagnosis not present

## 2014-10-29 DIAGNOSIS — Z91040 Latex allergy status: Secondary | ICD-10-CM | POA: Insufficient documentation

## 2014-10-29 DIAGNOSIS — Z792 Long term (current) use of antibiotics: Secondary | ICD-10-CM | POA: Diagnosis not present

## 2014-10-29 DIAGNOSIS — Z7951 Long term (current) use of inhaled steroids: Secondary | ICD-10-CM | POA: Insufficient documentation

## 2014-10-29 DIAGNOSIS — Z3202 Encounter for pregnancy test, result negative: Secondary | ICD-10-CM | POA: Insufficient documentation

## 2014-10-29 DIAGNOSIS — F329 Major depressive disorder, single episode, unspecified: Secondary | ICD-10-CM | POA: Insufficient documentation

## 2014-10-29 DIAGNOSIS — Z79899 Other long term (current) drug therapy: Secondary | ICD-10-CM | POA: Diagnosis not present

## 2014-10-29 DIAGNOSIS — Z87891 Personal history of nicotine dependence: Secondary | ICD-10-CM | POA: Insufficient documentation

## 2014-10-29 DIAGNOSIS — L03311 Cellulitis of abdominal wall: Secondary | ICD-10-CM | POA: Diagnosis not present

## 2014-10-29 DIAGNOSIS — R112 Nausea with vomiting, unspecified: Secondary | ICD-10-CM | POA: Diagnosis present

## 2014-10-29 LAB — CBC WITH DIFFERENTIAL/PLATELET
Basophils Absolute: 0 10*3/uL (ref 0.0–0.1)
Basophils Relative: 0 % (ref 0–1)
Eosinophils Absolute: 0.4 10*3/uL (ref 0.0–0.7)
Eosinophils Relative: 3 % (ref 0–5)
HCT: 35.2 % — ABNORMAL LOW (ref 36.0–46.0)
Hemoglobin: 11 g/dL — ABNORMAL LOW (ref 12.0–15.0)
LYMPHS ABS: 2.8 10*3/uL (ref 0.7–4.0)
LYMPHS PCT: 20 % (ref 12–46)
MCH: 27 pg (ref 26.0–34.0)
MCHC: 31.3 g/dL (ref 30.0–36.0)
MCV: 86.3 fL (ref 78.0–100.0)
Monocytes Absolute: 1 10*3/uL (ref 0.1–1.0)
Monocytes Relative: 7 % (ref 3–12)
NEUTROS ABS: 9.5 10*3/uL — AB (ref 1.7–7.7)
NEUTROS PCT: 70 % (ref 43–77)
PLATELETS: 352 10*3/uL (ref 150–400)
RBC: 4.08 MIL/uL (ref 3.87–5.11)
RDW: 15.6 % — AB (ref 11.5–15.5)
WBC: 13.7 10*3/uL — AB (ref 4.0–10.5)

## 2014-10-29 LAB — COMPREHENSIVE METABOLIC PANEL
ALK PHOS: 55 U/L (ref 38–126)
ALT: 21 U/L (ref 14–54)
ANION GAP: 9 (ref 5–15)
AST: 19 U/L (ref 15–41)
Albumin: 3.4 g/dL — ABNORMAL LOW (ref 3.5–5.0)
BILIRUBIN TOTAL: 0.4 mg/dL (ref 0.3–1.2)
BUN: 13 mg/dL (ref 6–20)
CO2: 27 mmol/L (ref 22–32)
Calcium: 8.8 mg/dL — ABNORMAL LOW (ref 8.9–10.3)
Chloride: 102 mmol/L (ref 101–111)
Creatinine, Ser: 0.68 mg/dL (ref 0.44–1.00)
GFR calc Af Amer: >60 mL/min (ref >60)
GFR calc non Af Amer: >60 mL/min (ref >60)
Glucose, Bld: 122 mg/dL — ABNORMAL HIGH (ref 65–99)
POTASSIUM: 3.5 mmol/L (ref 3.5–5.1)
SODIUM: 138 mmol/L (ref 135–145)
Total Protein: 7 g/dL (ref 6.5–8.1)

## 2014-10-29 LAB — POC URINE PREG, ED: Preg Test, Ur: NEGATIVE

## 2014-10-29 LAB — LACTIC ACID, PLASMA: Lactic Acid, Venous: 1.7 mmol/L (ref 0.5–2.0)

## 2014-10-29 MED ORDER — ONDANSETRON 4 MG PO TBDP: ORAL_TABLET | ORAL | Status: DC

## 2014-10-29 MED ORDER — ONDANSETRON HCL 4 MG/2ML IJ SOLN: 4.0000 mg | Freq: Once | INTRAMUSCULAR | Status: DC

## 2014-10-29 MED ORDER — CLINDAMYCIN HCL 300 MG PO CAPS: 300.0000 mg | ORAL_CAPSULE | Freq: Four times a day (QID) | ORAL | Status: AC

## 2014-10-29 MED ORDER — ONDANSETRON HCL 4 MG/2ML IJ SOLN
4.0000 mg | Freq: Once | INTRAMUSCULAR | Status: AC
  Administered 2014-10-29: 4 mg via INTRAVENOUS
  Filled 2014-10-29: qty 2

## 2014-10-29 MED ORDER — CLINDAMYCIN HCL 150 MG PO CAPS
300.0000 mg | ORAL_CAPSULE | Freq: Once | ORAL | Status: AC
  Administered 2014-10-29: 300 mg via ORAL
  Filled 2014-10-29: qty 2

## 2014-10-29 MED ORDER — SODIUM CHLORIDE 0.9 % IV BOLUS (SEPSIS)
1000.0000 mL | Freq: Once | INTRAVENOUS | Status: AC
  Administered 2014-10-29: 1000 mL via INTRAVENOUS

## 2014-10-29 NOTE — ED Notes (Signed)
   10/29/14 2013  N/V/D  Nausea/Vomiting Nausea  Onset of symptoms date 10/27/14  nausea for the past 2 days, denies any vomiting or diarrhea.

## 2014-10-29 NOTE — ED Notes (Signed)
2 day hx of n/v. Today also had abscess on R side of abdomen burst w/foul smelling fluid coming out. Subjective fever and chills.

## 2014-10-29 NOTE — ED Notes (Signed)
Redness around wound marked.

## 2014-10-29 NOTE — ED Provider Notes (Signed)
CSN: [id number]     Arrival date & time 10/29/14  1958 History  This chart was scribed for Merrily Pew, MD by Eustaquio Maize, ED Scribe. This patient was seen in room APA07/APA07 and the patient's care was started at 8:23 PM.   Chief Complaint  Patient presents with  . Emesis   The history is provided by the patient. No language interpreter was used.     HPI Comments: Heather [name] is a 39 y.o. female who presents to the Emergency Department complaining of nausea x 2 days. Pt also complains of an abscess to right lower abdomen that burst today while at a restaurant. She cannot say how long it has been there but notes black drainage to the area that was foul smelling. She states that she has never had an abscess in the past. Pt also states she began having subjective fevers, chills, and lightheadedness today on her way back from [facility]. Denies vomiting, rash, diarrhea, constipation,  or any other associated symptoms.   Past Medical History  Diagnosis Date  . Amenorrhea   . Dysmenorrhea   . COPD (chronic obstructive pulmonary disease)   . Dysrhythmia     DR Johnsie Cancel    . Asthma   . Sleep apnea     CPAP  . Shortness of breath     WITH EXERTION   . GERD (gastroesophageal reflux disease)     HEARTBURN   TUMS  . Neuromuscular disorder     RESTLESS LEG   . Depression   . Anxiety   . Obesity   . Morbid obesity   . Ectopic pregnancy 2013  . Eosinophilic esophagitis     Diagnosed at [facility] 06/16/2013, untreated  . Depression   . Anxiety    Past Surgical History  Procedure Laterality Date  . Cholecystectomy    . Tonsillectomy    . Tooth extraction  10/28/2011    Procedure: DENTAL RESTORATION/EXTRACTIONS;  Surgeon: Gae Bon, DDS;  Location: [place];  Service: Oral Surgery;;  . Dental surgery    . Esophagogastroduodenoscopy  May 2007    Dr. Gala Romney: Normal esophagus, stomach, D1, D2  . Esophagogastroduodenoscopy  06/16/2013    Dr. Carlton Adam, eosinophilic esophagitis,  reactive gastropathy, no esophageal dilation   Family History  Problem Relation Age of Onset  . Depression Mother   . Hypertension Sister   . Diabetes Paternal Uncle   . Anxiety disorder Paternal Uncle   . Colon cancer Maternal Grandmother     31s  . Liver disease Neg Hx    History  Substance Use Topics  . Smoking status: Former Smoker -- 0.50 packs/day for 8 years    Types: Cigarettes    Quit date: 04/25/2011  . Smokeless tobacco: Never Used  . Alcohol Use: No     Comment: occasional    OB History    Gravida Para Term Preterm AB TAB SAB Ectopic Multiple Living   1    1  1         Review of Systems  Constitutional: Positive for fever.  Gastrointestinal: Positive for nausea. Negative for vomiting, diarrhea and constipation.  Skin: Positive for rash and wound (Abscess to right lower abdomen with drainage).  Neurological: Positive for light-headedness.  All other systems reviewed and are negative.   Allergies  Bee venom; Nutritional supplements; Penicillins; Adhesive; and Latex  Home Medications   Prior to Admission medications   Medication Sig Start Date End Date Taking? Authorizing Provider  acetaminophen (TYLENOL)  500 MG tablet Take 1,000 mg by mouth every 6 (six) hours as needed. pain   Yes Historical Provider, MD  albuterol (PROVENTIL HFA;VENTOLIN HFA) 108 (90 BASE) MCG/ACT inhaler Inhale 1-2 puffs into the lungs every 6 (six) hours as needed for wheezing. 11/15/12  Yes Hope Bunnie Pion, NP  buPROPion (WELLBUTRIN XL) 300 MG 24 hr tablet Take 300 mg by mouth daily. 04/28/14  Yes Historical Provider, MD  busPIRone (BUSPAR) 10 MG tablet Take 15 mg by mouth 2 (two) times daily. For anxiety   Yes Historical Provider, MD  dicyclomine (BENTYL) 20 MG tablet Take 1 tablet (20 mg total) by mouth 3 (three) times daily before meals. As needed for abdominal cramps or diarrhea 08/25/14  Yes Mahala Menghini, PA-C  fluticasone [facility]) 50 MCG/ACT nasal spray Place 1-2 sprays into both  nostrils daily. 10/11/14  Yes Historical Provider, MD  furosemide (LASIX) 40 MG tablet Take 40 mg by mouth daily.  06/10/14  Yes Historical Provider, MD  loratadine (CLARITIN) 10 MG tablet Take 10 mg by mouth daily.   Yes Historical Provider, MD  metFORMIN (GLUCOPHAGE) 500 MG tablet Take 500 mg by mouth daily with breakfast.   Yes Historical Provider, MD  mometasone (ASMANEX) 220 MCG/INH inhaler Inhale 2 puffs into the lungs daily.   Yes Historical Provider, MD  montelukast (SINGULAIR) 10 MG tablet Take 10 mg by mouth at bedtime.   Yes Historical Provider, MD  naproxen sodium (ANAPROX) 220 MG tablet Take 440 mg by mouth 2 (two) times daily as needed (pain).   Yes Historical Provider, MD  pantoprazole (PROTONIX) 40 MG tablet TAKE 1 TABLET BY MOUTH EVERY DAY 04/27/14  Yes Carlis Stable, NP  potassium chloride (K-DUR) 10 MEQ tablet Take 1 tablet (10 mEq total) by mouth 2 (two) times daily. For potassium replacement (Lasix causes potassium loss) 08/12/11  Yes Darrol Jump, MD  Prenatal Vit-Fe Fumarate-FA (PRENATAL MULTIVITAMIN) TABS Take 1 tablet by mouth daily at 12 noon.   Yes Historical Provider, MD  rOPINIRole (REQUIP) 0.5 MG tablet Take 1 mg by mouth at bedtime.  07/04/14  Yes Historical Provider, MD  traMADol (ULTRAM) 50 MG tablet Take 50 mg by mouth every 6 (six) hours as needed for moderate pain.   Yes Historical Provider, MD  vitamin E 400 UNIT capsule Take 400 Units by mouth daily.   Yes Historical Provider, MD  ziprasidone (GEODON) 60 MG capsule Take 60 mg by mouth every evening.    Yes Historical Provider, MD  clindamycin (CLEOCIN) 300 MG capsule Take 1 capsule (300 mg total) by mouth 4 (four) times daily. 10/29/14 11/12/14  Merrily Pew, MD  EPIPEN 2-PAK 0.3 MG/0.3ML SOAJ injection Inject 0.3 mLs into the muscle as needed. For allergic reaction 07/29/14   Historical Provider, MD  medroxyPROGESTERone (PROVERA) 10 MG tablet 2 tablets daily. For five days each month 07/05/14   Historical Provider, MD   ondansetron (ZOFRAN ODT) 4 MG disintegrating tablet 4mg  ODT q4 hours prn nausea/vomit 10/29/14   Merrily Pew, MD   Triage Vitals: BP 137/99 mmHg  Pulse 96  Temp(Src) 99.1 F (37.3 C) (Oral)  Resp 24  Ht 5\' 3"  (1.6 m)  Wt 460 lb (208.655 kg)  BMI 81.51 kg/m2  SpO2 96%  LMP 10/24/2014  Physical Exam  Constitutional: She is oriented to person, place, and time. She appears well-developed and well-nourished. No distress.  HENT:  Head: Normocephalic and atraumatic.  Erythema on face  Eyes: Conjunctivae and EOM are normal. Pupils  are equal, round, and reactive to light.  Neck: Neck supple. No tracheal deviation present.  Cardiovascular: Regular rhythm.  Tachycardia present.   Pulmonary/Chest: Effort normal and breath sounds normal. No respiratory distress. She has no wheezes. She has no rales.  Abdominal: Soft. There is no tenderness.  Musculoskeletal: Normal range of motion. She exhibits no edema.  Neurological: She is alert and oriented to person, place, and time.  Skin: Skin is warm and dry. No rash noted.  RLQ above inguinal area, 20 x 12 erythematous area with 2 x 4 cm dark area in the middle that is open with purulent drainage.   Psychiatric: She has a normal mood and affect. Her behavior is normal.  Nursing note and vitals reviewed.   ED Course  Procedures (including critical care time)  DIAGNOSTIC STUDIES: Oxygen Saturation is 96% on RA, normal by my interpretation.    COORDINATION OF CARE: 8:30 PM-Discussed treatment plan with pt at bedside and pt agreed to plan.   Labs Review Labs Reviewed  CBC WITH DIFFERENTIAL/PLATELET - Abnormal; Notable for the following:    WBC 13.7 (*)    Hemoglobin 11.0 (*)    HCT 35.2 (*)    RDW 15.6 (*)    Neutro Abs 9.5 (*)    All other components within normal limits  COMPREHENSIVE METABOLIC PANEL - Abnormal; Notable for the following:    Glucose, Bld 122 (*)    Calcium 8.8 (*)    Albumin 3.4 (*)    All other components within  normal limits  LACTIC ACID, PLASMA  LACTIC ACID, PLASMA  POC URINE PREG, ED    Imaging Review No results found.   EKG Interpretation None      MDM   Final diagnoses:  Cellulitis of abdominal wall  Abscess   39 year old female morbid obesity, anxiety, depression presents to emergency department today with cellulitis and abscess of her pannus. Some nausea with this but no fevers, chills and no vomiting. Exam as described above. Ultrasound without any evidence of deep involvement. Patient persistently wanting to go home. As she is afebrile, had a normal blood pressure, normal lactic acid and a white count at her baseline I felt that a trial of outpatient antibiotics wasn't totally unreasonable. Discussed with the patient and her fianc that any worsening redness, worsening drainage, fever, vomiting or feeling worse overall would necessitate her come back here for further treatment. She will follow up with her physician to get set up with wound care for the abscess.  I have personally and contemperaneously reviewed labs and imaging and used in my decision making as above.   A medical screening exam was performed and I feel the patient has had an appropriate workup for their chief complaint at this time and likelihood of emergent condition existing is low. They have been counseled on decision, discharge, follow up and which symptoms necessitate immediate return to the emergency department. They or their family verbally stated understanding and agreement with plan and discharged in stable condition.    I personally performed the services described in this documentation, which was scribed in my presence. The recorded information has been reviewed and is accurate.      Merrily Pew, MD 10/29/14 [phone]

## 2014-10-29 NOTE — ED Notes (Signed)
Pt alert & oriented x4, stable gait. Patient given discharge instructions, paperwork & prescription(s). Patient  instructed to stop at the registration desk to finish any additional paperwork. Patient verbalized understanding. Pt left department w/ no further questions. 

## 2014-10-29 NOTE — ED Notes (Addendum)
   10/29/14 2018  Skin Color/Condition  Skin Color/Condition (WDL) X  Skin Color Appropriate for ethnicity  Skin Integrity Other (Comment) (open wound under the right abdominal fold appx 3cm x3cm. darkened skin color all around the area w/ a foul smell.)  appx 3cm x 3cm open, draining would. dark tissue noted all around the wound. red area around wound extending in all directions

## 2014-11-02 DIAGNOSIS — R7301 Impaired fasting glucose: Secondary | ICD-10-CM | POA: Diagnosis not present

## 2014-11-02 DIAGNOSIS — F064 Anxiety disorder due to known physiological condition: Secondary | ICD-10-CM | POA: Diagnosis not present

## 2014-11-02 DIAGNOSIS — F3341 Major depressive disorder, recurrent, in partial remission: Secondary | ICD-10-CM | POA: Diagnosis not present

## 2014-11-02 DIAGNOSIS — L03311 Cellulitis of abdominal wall: Secondary | ICD-10-CM | POA: Diagnosis not present

## 2014-11-03 ENCOUNTER — Other Ambulatory Visit: Payer: Self-pay | Admitting: Nurse Practitioner

## 2014-11-04 DIAGNOSIS — F309 Manic episode, unspecified: Secondary | ICD-10-CM | POA: Diagnosis not present

## 2014-11-04 DIAGNOSIS — Z6841 Body Mass Index (BMI) 40.0 and over, adult: Secondary | ICD-10-CM | POA: Diagnosis not present

## 2014-11-04 DIAGNOSIS — N939 Abnormal uterine and vaginal bleeding, unspecified: Secondary | ICD-10-CM | POA: Diagnosis not present

## 2014-11-04 DIAGNOSIS — N899 Noninflammatory disorder of vagina, unspecified: Secondary | ICD-10-CM | POA: Diagnosis not present

## 2014-11-04 DIAGNOSIS — T148 Other injury of unspecified body region: Secondary | ICD-10-CM | POA: Diagnosis not present

## 2014-11-04 DIAGNOSIS — L089 Local infection of the skin and subcutaneous tissue, unspecified: Secondary | ICD-10-CM | POA: Diagnosis not present

## 2014-11-06 NOTE — Progress Notes (Signed)
REVIEWED-NO ADDITIONAL RECOMMENDATIONS. 

## 2014-11-11 ENCOUNTER — Inpatient Hospital Stay (HOSPITAL_COMMUNITY)
Admission: EM | Admit: 2014-11-11 | Discharge: 2014-11-13 | DRG: 603 | Disposition: A | Discharge status: Home / Self Care | Payer: Medicare Other | Attending: Internal Medicine | Admitting: Internal Medicine

## 2014-11-11 ENCOUNTER — Emergency Department (HOSPITAL_COMMUNITY): Payer: Medicare Other

## 2014-11-11 ENCOUNTER — Encounter (HOSPITAL_COMMUNITY): Payer: Self-pay | Admitting: Emergency Medicine

## 2014-11-11 DIAGNOSIS — Z880 Allergy status to penicillin: Secondary | ICD-10-CM | POA: Diagnosis not present

## 2014-11-11 DIAGNOSIS — G8929 Other chronic pain: Secondary | ICD-10-CM | POA: Diagnosis present

## 2014-11-11 DIAGNOSIS — Z881 Allergy status to other antibiotic agents status: Secondary | ICD-10-CM

## 2014-11-11 DIAGNOSIS — J449 Chronic obstructive pulmonary disease, unspecified: Secondary | ICD-10-CM | POA: Diagnosis present

## 2014-11-11 DIAGNOSIS — G4730 Sleep apnea, unspecified: Secondary | ICD-10-CM | POA: Diagnosis present

## 2014-11-11 DIAGNOSIS — L309 Dermatitis, unspecified: Secondary | ICD-10-CM | POA: Diagnosis present

## 2014-11-11 DIAGNOSIS — Z800 Family history of malignant neoplasm of digestive organs: Secondary | ICD-10-CM | POA: Diagnosis not present

## 2014-11-11 DIAGNOSIS — T148XXA Other injury of unspecified body region, initial encounter: Secondary | ICD-10-CM

## 2014-11-11 DIAGNOSIS — K219 Gastro-esophageal reflux disease without esophagitis: Secondary | ICD-10-CM | POA: Diagnosis not present

## 2014-11-11 DIAGNOSIS — S31109A Unspecified open wound of abdominal wall, unspecified quadrant without penetration into peritoneal cavity, initial encounter: Secondary | ICD-10-CM | POA: Diagnosis not present

## 2014-11-11 DIAGNOSIS — Z91040 Latex allergy status: Secondary | ICD-10-CM

## 2014-11-11 DIAGNOSIS — M549 Dorsalgia, unspecified: Secondary | ICD-10-CM | POA: Diagnosis present

## 2014-11-11 DIAGNOSIS — E119 Type 2 diabetes mellitus without complications: Secondary | ICD-10-CM | POA: Diagnosis present

## 2014-11-11 DIAGNOSIS — J45909 Unspecified asthma, uncomplicated: Secondary | ICD-10-CM | POA: Diagnosis present

## 2014-11-11 DIAGNOSIS — I10 Essential (primary) hypertension: Secondary | ICD-10-CM | POA: Diagnosis present

## 2014-11-11 DIAGNOSIS — F329 Major depressive disorder, single episode, unspecified: Secondary | ICD-10-CM | POA: Diagnosis present

## 2014-11-11 DIAGNOSIS — F419 Anxiety disorder, unspecified: Secondary | ICD-10-CM | POA: Diagnosis present

## 2014-11-11 DIAGNOSIS — Z87891 Personal history of nicotine dependence: Secondary | ICD-10-CM | POA: Diagnosis not present

## 2014-11-11 DIAGNOSIS — T148 Other injury of unspecified body region: Secondary | ICD-10-CM | POA: Diagnosis not present

## 2014-11-11 DIAGNOSIS — Z6841 Body Mass Index (BMI) 40.0 and over, adult: Secondary | ICD-10-CM | POA: Diagnosis not present

## 2014-11-11 DIAGNOSIS — M793 Panniculitis, unspecified: Secondary | ICD-10-CM | POA: Diagnosis present

## 2014-11-11 DIAGNOSIS — A419 Sepsis, unspecified organism: Secondary | ICD-10-CM | POA: Diagnosis present

## 2014-11-11 DIAGNOSIS — Z79899 Other long term (current) drug therapy: Secondary | ICD-10-CM

## 2014-11-11 DIAGNOSIS — F319 Bipolar disorder, unspecified: Secondary | ICD-10-CM | POA: Diagnosis present

## 2014-11-11 DIAGNOSIS — F250 Schizoaffective disorder, bipolar type: Secondary | ICD-10-CM | POA: Diagnosis present

## 2014-11-11 DIAGNOSIS — F310 Bipolar disorder, current episode hypomanic: Secondary | ICD-10-CM | POA: Diagnosis not present

## 2014-11-11 DIAGNOSIS — L03311 Cellulitis of abdominal wall: Secondary | ICD-10-CM | POA: Diagnosis not present

## 2014-11-11 DIAGNOSIS — L03115 Cellulitis of right lower limb: Secondary | ICD-10-CM

## 2014-11-11 HISTORY — DX: Schizoaffective disorder, bipolar type: F25.0

## 2014-11-11 HISTORY — DX: Sepsis, unspecified organism: A41.9

## 2014-11-11 HISTORY — DX: Type 2 diabetes mellitus without complications: E11.9

## 2014-11-11 LAB — BASIC METABOLIC PANEL
ANION GAP: 6 (ref 5–15)
BUN: 10 mg/dL (ref 6–20)
CALCIUM: 9 mg/dL (ref 8.9–10.3)
CO2: 28 mmol/L (ref 22–32)
Chloride: 102 mmol/L (ref 101–111)
Creatinine, Ser: 0.7 mg/dL (ref 0.44–1.00)
GFR calc Af Amer: >60 mL/min (ref >60)
GLUCOSE: 99 mg/dL (ref 65–99)
Potassium: 4.2 mmol/L (ref 3.5–5.1)
SODIUM: 136 mmol/L (ref 135–145)

## 2014-11-11 LAB — URINE MICROSCOPIC-ADD ON

## 2014-11-11 LAB — CBC WITH DIFFERENTIAL/PLATELET
Basophils Absolute: 0 10*3/uL (ref 0.0–0.1)
Basophils Relative: 0 % (ref 0–1)
EOS PCT: 3 % (ref 0–5)
Eosinophils Absolute: 0.4 10*3/uL (ref 0.0–0.7)
HCT: 39.2 % (ref 36.0–46.0)
HEMOGLOBIN: 12.2 g/dL (ref 12.0–15.0)
Lymphocytes Relative: 19 % (ref 12–46)
Lymphs Abs: 2.5 10*3/uL (ref 0.7–4.0)
MCH: 26.9 pg (ref 26.0–34.0)
MCHC: 31.1 g/dL (ref 30.0–36.0)
MCV: 86.3 fL (ref 78.0–100.0)
Monocytes Absolute: 0.8 10*3/uL (ref 0.1–1.0)
Monocytes Relative: 6 % (ref 3–12)
Neutro Abs: 9.8 10*3/uL — ABNORMAL HIGH (ref 1.7–7.7)
Neutrophils Relative %: 72 % (ref 43–77)
Platelets: 429 10*3/uL — ABNORMAL HIGH (ref 150–400)
RBC: 4.54 MIL/uL (ref 3.87–5.11)
RDW: 15.4 % (ref 11.5–15.5)
WBC: 13.5 10*3/uL — ABNORMAL HIGH (ref 4.0–10.5)

## 2014-11-11 LAB — URINALYSIS, ROUTINE W REFLEX MICROSCOPIC
Bilirubin Urine: NEGATIVE
Glucose, UA: NEGATIVE mg/dL
Ketones, ur: NEGATIVE mg/dL
Nitrite: NEGATIVE
Protein, ur: NEGATIVE mg/dL
SPECIFIC GRAVITY, URINE: 1.02 (ref 1.005–1.030)
Urobilinogen, UA: 0.2 mg/dL (ref 0.0–1.0)
pH: 6 (ref 5.0–8.0)

## 2014-11-11 LAB — TSH: TSH: 2.088 u[IU]/mL (ref 0.350–4.500)

## 2014-11-11 LAB — PREGNANCY, URINE: Preg Test, Ur: NEGATIVE

## 2014-11-11 LAB — LACTIC ACID, PLASMA
LACTIC ACID, VENOUS: 1.5 mmol/L (ref 0.5–2.0)
LACTIC ACID, VENOUS: 1.4 mmol/L (ref 0.5–2.0)

## 2014-11-11 MED ORDER — DIPHENHYDRAMINE HCL 25 MG PO CAPS
25.0000 mg | ORAL_CAPSULE | Freq: Three times a day (TID) | ORAL | Status: DC
  Administered 2014-11-11 – 2014-11-13 (×5): 25 mg via ORAL
  Filled 2014-11-11 (×5): qty 1

## 2014-11-11 MED ORDER — BUPROPION HCL ER (XL) 150 MG PO TB24
300.0000 mg | ORAL_TABLET | Freq: Every day | ORAL | Status: DC
  Administered 2014-11-12 – 2014-11-13 (×2): 300 mg via ORAL
  Filled 2014-11-11 (×3): qty 2

## 2014-11-11 MED ORDER — DEXTROSE 5 % IV SOLN
INTRAVENOUS | Status: AC
  Filled 2014-11-11 (×2): qty 2

## 2014-11-11 MED ORDER — METFORMIN HCL 500 MG PO TABS
500.0000 mg | ORAL_TABLET | Freq: Every day | ORAL | Status: DC
  Administered 2014-11-12 – 2014-11-13 (×2): 500 mg via ORAL
  Filled 2014-11-11 (×2): qty 1

## 2014-11-11 MED ORDER — PANTOPRAZOLE SODIUM 40 MG PO TBEC
40.0000 mg | DELAYED_RELEASE_TABLET | Freq: Every day | ORAL | Status: DC
  Administered 2014-11-12 – 2014-11-13 (×2): 40 mg via ORAL
  Filled 2014-11-11 (×2): qty 1

## 2014-11-11 MED ORDER — SORBITOL 70 % SOLN
30.0000 mL | Freq: Every day | Status: DC | PRN
Start: 2014-11-11 — End: 2014-11-13

## 2014-11-11 MED ORDER — ALBUTEROL SULFATE (2.5 MG/3ML) 0.083% IN NEBU
3.0000 mL | INHALATION_SOLUTION | Freq: Four times a day (QID) | RESPIRATORY_TRACT | Status: DC | PRN
  Administered 2014-11-13: 3 mL via RESPIRATORY_TRACT
  Filled 2014-11-11: qty 3

## 2014-11-11 MED ORDER — ROPINIROLE HCL 1 MG PO TABS
1.0000 mg | ORAL_TABLET | Freq: Every day | ORAL | Status: DC
  Administered 2014-11-11 – 2014-11-12 (×2): 1 mg via ORAL
  Filled 2014-11-11 (×2): qty 1

## 2014-11-11 MED ORDER — BUDESONIDE 0.5 MG/2ML IN SUSP
RESPIRATORY_TRACT | Status: AC
  Filled 2014-11-11: qty 2

## 2014-11-11 MED ORDER — SODIUM CHLORIDE 0.9 % IV SOLN
2000.0000 mg | Freq: Once | INTRAVENOUS | Status: AC
  Administered 2014-11-11: 2000 mg via INTRAVENOUS
  Filled 2014-11-11: qty 500

## 2014-11-11 MED ORDER — AZTREONAM 2 G IJ SOLR
2.0000 g | Freq: Three times a day (TID) | INTRAMUSCULAR | Status: DC
  Administered 2014-11-11 – 2014-11-13 (×6): 2 g via INTRAVENOUS
  Filled 2014-11-11 (×9): qty 2

## 2014-11-11 MED ORDER — ACETAMINOPHEN 500 MG PO TABS
1000.0000 mg | ORAL_TABLET | Freq: Once | ORAL | Status: AC
  Administered 2014-11-11: 1000 mg via ORAL
  Filled 2014-11-11: qty 2

## 2014-11-11 MED ORDER — ONDANSETRON HCL 4 MG PO TABS: 4.0000 mg | ORAL_TABLET | Freq: Four times a day (QID) | ORAL | Status: DC | PRN

## 2014-11-11 MED ORDER — BUSPIRONE HCL 5 MG PO TABS
15.0000 mg | ORAL_TABLET | Freq: Two times a day (BID) | ORAL | Status: DC
  Administered 2014-11-11 – 2014-11-13 (×4): 15 mg via ORAL
  Filled 2014-11-11 (×4): qty 3

## 2014-11-11 MED ORDER — ONDANSETRON HCL 4 MG/2ML IJ SOLN
4.0000 mg | Freq: Four times a day (QID) | INTRAMUSCULAR | Status: DC | PRN
  Administered 2014-11-12 – 2014-11-13 (×2): 4 mg via INTRAVENOUS
  Filled 2014-11-11 (×3): qty 2

## 2014-11-11 MED ORDER — ZIPRASIDONE HCL 20 MG PO CAPS
60.0000 mg | ORAL_CAPSULE | Freq: Every evening | ORAL | Status: DC
  Administered 2014-11-11 – 2014-11-12 (×2): 60 mg via ORAL
  Filled 2014-11-11 (×3): qty 3

## 2014-11-11 MED ORDER — LORATADINE 10 MG PO TABS
10.0000 mg | ORAL_TABLET | Freq: Every day | ORAL | Status: DC
  Administered 2014-11-12 – 2014-11-13 (×2): 10 mg via ORAL
  Filled 2014-11-11 (×2): qty 1

## 2014-11-11 MED ORDER — FLUCONAZOLE IN SODIUM CHLORIDE 200-0.9 MG/100ML-% IV SOLN
INTRAVENOUS | Status: AC
  Filled 2014-11-11: qty 200

## 2014-11-11 MED ORDER — BUDESONIDE 0.5 MG/2ML IN SUSP
0.5000 mg | Freq: Two times a day (BID) | RESPIRATORY_TRACT | Status: DC
  Administered 2014-11-11 – 2014-11-13 (×4): 0.5 mg via RESPIRATORY_TRACT
  Filled 2014-11-11 (×6): qty 2

## 2014-11-11 MED ORDER — ALUM & MAG HYDROXIDE-SIMETH 200-200-20 MG/5ML PO SUSP: 30.0000 mL | Freq: Four times a day (QID) | ORAL | Status: DC | PRN

## 2014-11-11 MED ORDER — TRAMADOL HCL 50 MG PO TABS: 50.0000 mg | ORAL_TABLET | Freq: Four times a day (QID) | ORAL | Status: DC | PRN

## 2014-11-11 MED ORDER — FLUCONAZOLE IN SODIUM CHLORIDE 400-0.9 MG/200ML-% IV SOLN
400.0000 mg | INTRAVENOUS | Status: DC
  Administered 2014-11-11: 400 mg via INTRAVENOUS
  Filled 2014-11-11 (×3): qty 200

## 2014-11-11 MED ORDER — TRAZODONE HCL 50 MG PO TABS: 25.0000 mg | ORAL_TABLET | Freq: Every evening | ORAL | Status: DC | PRN

## 2014-11-11 MED ORDER — ENOXAPARIN SODIUM 100 MG/ML ~~LOC~~ SOLN
100.0000 mg | SUBCUTANEOUS | Status: DC
  Administered 2014-11-11 – 2014-11-12 (×2): 100 mg via SUBCUTANEOUS
  Filled 2014-11-11 (×2): qty 1

## 2014-11-11 MED ORDER — SODIUM CHLORIDE 0.9 % IV SOLN
INTRAVENOUS | Status: AC
  Administered 2014-11-11: 18:00:00 via INTRAVENOUS

## 2014-11-11 MED ORDER — ACETAMINOPHEN 325 MG PO TABS: 650.0000 mg | ORAL_TABLET | Freq: Four times a day (QID) | ORAL | Status: DC | PRN

## 2014-11-11 MED ORDER — VITAMIN E 180 MG (400 UNIT) PO CAPS
400.0000 [IU] | ORAL_CAPSULE | Freq: Every day | ORAL | Status: DC
  Administered 2014-11-12 – 2014-11-13 (×2): 400 [IU] via ORAL
  Filled 2014-11-11 (×3): qty 1

## 2014-11-11 MED ORDER — VANCOMYCIN HCL 10 G IV SOLR
1500.0000 mg | Freq: Three times a day (TID) | INTRAVENOUS | Status: DC
  Administered 2014-11-11 – 2014-11-12 (×4): 1500 mg via INTRAVENOUS
  Filled 2014-11-11 (×9): qty 1500

## 2014-11-11 MED ORDER — FLUTICASONE PROPIONATE 50 MCG/ACT NA SUSP
1.0000 | Freq: Every day | NASAL | Status: DC
  Administered 2014-11-12 – 2014-11-13 (×2): 1 via NASAL
  Filled 2014-11-11: qty 32

## 2014-11-11 MED ORDER — MONTELUKAST SODIUM 10 MG PO TABS
10.0000 mg | ORAL_TABLET | Freq: Every day | ORAL | Status: DC
  Administered 2014-11-11 – 2014-11-12 (×2): 10 mg via ORAL
  Filled 2014-11-11 (×2): qty 1

## 2014-11-11 MED ORDER — POTASSIUM CHLORIDE CRYS ER 10 MEQ PO TBCR
10.0000 meq | EXTENDED_RELEASE_TABLET | Freq: Every day | ORAL | Status: DC
  Administered 2014-11-12 – 2014-11-13 (×2): 10 meq via ORAL
  Filled 2014-11-11 (×2): qty 1

## 2014-11-11 MED ORDER — ACETAMINOPHEN 650 MG RE SUPP: 650.0000 mg | Freq: Four times a day (QID) | RECTAL | Status: DC | PRN

## 2014-11-11 MED ORDER — ZIPRASIDONE HCL 60 MG PO CAPS
ORAL_CAPSULE | ORAL | Status: AC
  Filled 2014-11-11: qty 1

## 2014-11-11 MED ORDER — MOMETASONE FUROATE 220 MCG/INH IN AEPB: 2.0000 | INHALATION_SPRAY | Freq: Every day | RESPIRATORY_TRACT | Status: DC

## 2014-11-11 MED ORDER — DICYCLOMINE HCL 10 MG PO CAPS
20.0000 mg | ORAL_CAPSULE | Freq: Three times a day (TID) | ORAL | Status: DC
  Administered 2014-11-12 – 2014-11-13 (×5): 20 mg via ORAL
  Filled 2014-11-11 (×5): qty 2

## 2014-11-11 MED ORDER — NAPROXEN 250 MG PO TABS: 500.0000 mg | ORAL_TABLET | Freq: Two times a day (BID) | ORAL | Status: DC | PRN

## 2014-11-11 MED ORDER — FUROSEMIDE 40 MG PO TABS
40.0000 mg | ORAL_TABLET | Freq: Every day | ORAL | Status: DC
  Administered 2014-11-12 – 2014-11-13 (×2): 40 mg via ORAL
  Filled 2014-11-11 (×2): qty 1

## 2014-11-11 NOTE — H&P (Signed)
[facility] History and Physical  Heather [name]:[id number] DOB: [birth year] DOA: 11/11/2014  Referring physician: Thurnell Garbe PCP: [name], MD   Chief Complaint: wound infection  HPI: Heather [name] is a 39 y.o. female with a past medical history that includes COPD, GERD, morbid obesity, schizoaffective disorder bipolar type, hypertension since emergency department with chief complaint of worsening wound infection. Initial evaluation in the emergency department reveals sepsis with leukocytosis tachycardia fever and an open draining wound. Patient reports for the last 2 weeks she has been treating a quarter size wound in her pannus. She has been on clindamycin with little improvement last 3 days the "redness has spread and a week ago it opened and drained all over me" associated symptoms include nausea with one episode of emesis fever chills. She denies chest pain palpitation shortness of breath headache dizziness syncope or near-syncope. She denies abdominal pain dysuria hematuria frequency or urgency. She does report chronic back pain after a fall. Workup in the emergency department reveals WBCs of 13.5 platelets 429 otherwise her lab work is unremarkable. She is tachycardic with a fever of 99.3 and the leukocytosis. She is not hypoxic. In the emergency department she is provided with vancomycin and IV fluids. Her lactic acid is within the limits of normal   Review of Systems:  10 point review of systems complete and all systems are negative except as indicated in the history of present illness  Past Medical History  Diagnosis Date  . Amenorrhea   . Dysmenorrhea   . COPD (chronic obstructive pulmonary disease)   . Dysrhythmia     DR Johnsie Cancel    . Asthma   . Sleep apnea     CPAP  . Shortness of breath     WITH EXERTION   . GERD (gastroesophageal reflux disease)     HEARTBURN   TUMS  . Neuromuscular disorder     RESTLESS LEG   . Depression   . Anxiety   . Obesity     . Morbid obesity   . Ectopic pregnancy 2013  . Eosinophilic esophagitis     Diagnosed at [facility] 06/16/2013, untreated  . Depression   . Anxiety   . Schizoaffective disorder, bipolar type    Past Surgical History  Procedure Laterality Date  . Cholecystectomy    . Tonsillectomy    . Tooth extraction  10/28/2011    Procedure: DENTAL RESTORATION/EXTRACTIONS;  Surgeon: Gae Bon, DDS;  Location: [place];  Service: Oral Surgery;;  . Dental surgery    . Esophagogastroduodenoscopy  May 2007    Dr. Gala Romney: Normal esophagus, stomach, D1, D2  . Esophagogastroduodenoscopy  06/16/2013    Dr. Carlton Adam, eosinophilic esophagitis, reactive gastropathy, no esophageal dilation   Social History:  reports that she quit smoking about 3 years ago. Her smoking use included Cigarettes. She has a 4 pack-year smoking history. She has never used smokeless tobacco. She reports that she does not drink alcohol or use illicit drugs. His morbidly obese she is independent with ADLs she's been on disability since she was 20 for mental illness she lives with her boyfriend Allergies  Allergen Reactions  . Bee Venom Shortness Of Breath  . Nutritional Supplements Anaphylaxis  . Penicillins Anaphylaxis    REACTION: Angioedema  . Adhesive [Tape] Rash  . Latex Rash    Family History  Problem Relation Age of Onset  . Depression Mother   . Hypertension Sister   . Diabetes Paternal Uncle   . Anxiety disorder Paternal  Uncle   . Colon cancer Maternal Grandmother     76s  . Liver disease Neg Hx      Prior to Admission medications   Medication Sig Start Date End Date Taking? Authorizing Provider  buPROPion (WELLBUTRIN XL) 300 MG 24 hr tablet Take 300 mg by mouth daily. 04/28/14  Yes Historical Provider, MD  busPIRone (BUSPAR) 10 MG tablet Take 15 mg by mouth 2 (two) times daily. For anxiety   Yes Historical Provider, MD  clindamycin (CLEOCIN) 300 MG capsule Take 1 capsule (300 mg total) by mouth 4 (four) times daily.  10/29/14 11/12/14 Yes Merrily Pew, MD  dicyclomine (BENTYL) 20 MG tablet Take 1 tablet (20 mg total) by mouth 3 (three) times daily before meals. As needed for abdominal cramps or diarrhea 08/25/14  Yes Mahala Menghini, PA-C  fluticasone [facility]) 50 MCG/ACT nasal spray Place 1-2 sprays into both nostrils daily. 10/11/14  Yes Historical Provider, MD  furosemide (LASIX) 40 MG tablet Take 40 mg by mouth daily.  06/10/14  Yes Historical Provider, MD  loratadine (CLARITIN) 10 MG tablet Take 10 mg by mouth daily.   Yes Historical Provider, MD  medroxyPROGESTERone (PROVERA) 10 MG tablet 2 tablets daily. For five days each month 07/05/14  Yes Historical Provider, MD  metFORMIN (GLUCOPHAGE) 500 MG tablet Take 500 mg by mouth daily with breakfast.   Yes Historical Provider, MD  mometasone (ASMANEX) 220 MCG/INH inhaler Inhale 2 puffs into the lungs daily.   Yes Historical Provider, MD  montelukast (SINGULAIR) 10 MG tablet Take 10 mg by mouth at bedtime.   Yes Historical Provider, MD  pantoprazole (PROTONIX) 40 MG tablet TAKE ONE TABLET BY MOUTH DAILY 11/03/14  Yes Mahala Menghini, PA-C  potassium chloride (MICRO-K) 10 MEQ CR capsule Take 10 mEq by mouth 2 (two) times daily.   Yes Historical Provider, MD  Prenatal Vit-Fe Fumarate-FA (PRENATAL MULTIVITAMIN) TABS Take 1 tablet by mouth daily.    Yes Historical Provider, MD  rOPINIRole (REQUIP) 0.5 MG tablet Take 1 mg by mouth at bedtime.  07/04/14  Yes Historical Provider, MD  vitamin E 400 UNIT capsule Take 400 Units by mouth daily.   Yes Historical Provider, MD  ziprasidone (GEODON) 60 MG capsule Take 60 mg by mouth every evening.    Yes Historical Provider, MD  acetaminophen (TYLENOL) 500 MG tablet Take 1,000 mg by mouth every 6 (six) hours as needed. pain    Historical Provider, MD  albuterol (PROVENTIL HFA;VENTOLIN HFA) 108 (90 BASE) MCG/ACT inhaler Inhale 1-2 puffs into the lungs every 6 (six) hours as needed for wheezing. 11/15/12   Hope Bunnie Pion, NP  EPIPEN 2-PAK 0.3  MG/0.3ML SOAJ injection Inject 0.3 mLs into the muscle as needed. For allergic reaction 07/29/14   Historical Provider, MD  naproxen sodium (ANAPROX) 220 MG tablet Take 440 mg by mouth 2 (two) times daily as needed (pain).    Historical Provider, MD  ondansetron (ZOFRAN ODT) 4 MG disintegrating tablet 4mg  ODT q4 hours prn nausea/vomit Patient taking differently: Take 4 mg by mouth every 4 (four) hours as needed for nausea or vomiting. 4mg  ODT q4 hours prn nausea/vomit 10/29/14   Merrily Pew, MD  traMADol (ULTRAM) 50 MG tablet Take 50 mg by mouth every 6 (six) hours as needed for moderate pain.    Historical Provider, MD   Physical Exam: Filed Vitals:   11/11/14 1147 11/11/14 1328 11/11/14 1442  BP: 156/100 136/68   Pulse: 105 79   Temp: 98.1 F (  36.7 C)  99.3 F (37.4 C)  TempSrc: Oral  Oral  Resp: 20 20   Height: 5\' 3"  (1.6 m)    Weight: 209.562 kg (462 lb)    SpO2: 99% 100%     Wt Readings from Last 3 Encounters:  11/11/14 209.562 kg (462 lb)  10/29/14 208.655 kg (460 lb)  08/23/14 209.199 kg (461 lb 3.2 oz)    General:  Appears calm and comfortable, morbidly obese Eyes: PERRL, normal lids, irises & conjunctiva ENT: grossly normal hearing, lips & tongue Neck: no LAD, masses or thyromegaly Cardiovascular: RRR, no m/r/g. No LE edema. Telemetry: SR, no arrhythmias  Respiratory: CTA bilaterally, no w/r/r. Normal respiratory effort. Abdomen: soft, ntnd Skin: Quarter size open draining wound right side pannus with erythema extending 2 upper legs. Moderate amount purulent drainage no odor Musculoskeletal: grossly normal tone BUE/BLE Psychiatric: grossly normal mood and affect, speech fluent and appropriate Neurologic: grossly non-focal.          Labs on Admission:  Basic Metabolic Panel:  Recent Labs Lab 11/11/14 1241  NA 136  K 4.2  CL 102  CO2 28  GLUCOSE 99  BUN 10  CREATININE 0.70  CALCIUM 9.0   Liver Function Tests: No results for input(s): AST, ALT, ALKPHOS,  BILITOT, PROT, ALBUMIN in the last 168 hours. No results for input(s): LIPASE, AMYLASE in the last 168 hours. No results for input(s): AMMONIA in the last 168 hours. CBC:  Recent Labs Lab 11/11/14 1241  WBC 13.5*  NEUTROABS 9.8*  HGB 12.2  HCT 39.2  MCV 86.3  PLT 429*   Cardiac Enzymes: No results for input(s): CKTOTAL, CKMB, CKMBINDEX, TROPONINI in the last 168 hours.  BNP (last 3 results) No results for input(s): BNP in the last 8760 hours.  ProBNP (last 3 results) No results for input(s): PROBNP in the last 8760 hours.  CBG: No results for input(s): GLUCAP in the last 168 hours.  Radiological Exams on Admission: No results found.  EKG:   Assessment/Plan Principal Problem:   Sepsis; currently. Fever mild tachycardia leukocytosis. Will provide IV fluids vancomycin and Zosyn. Will get blood cultures. She is hemodynamically stable and not hypoxic.  Active Problems: Open wound: Parameter red drainage purulent quarter size. No odor. Antibody as above. Wound consult.    Cellulitis: See #1     Morbid obesity: BMI 82. Nutritional consult    Bipolar disorder: Appears stable at baseline continue home meds    GERD: Stable at baseline continue home meds      Code Status: full DVT Prophylaxis: Family Communication: none Disposition Plan: home  Time spent: 46 minutes  [facility] Pager [phone]

## 2014-11-11 NOTE — Progress Notes (Signed)
ANTIBIOTIC CONSULT NOTE - INITIAL  Pharmacy Consult for Vancomycin Indication: cellulitis  Allergies  Allergen Reactions  . Bee Venom Shortness Of Breath  . Nutritional Supplements Anaphylaxis  . Penicillins Anaphylaxis    REACTION: Angioedema  . Adhesive [Tape] Rash  . Latex Rash  . Vancomycin Other (See Comments)    Pt can tolerate Vancomycin but did cause Red-Man Syndrome.  Recommend to pre-medicate with Benadryl before doses administered.     Patient Measurements: Height: 5\' 3"  (160 cm) Weight: (!) 462 lb (209.562 kg) IBW/kg (Calculated) : 52.4  Vital Signs: Temp: 99.2 F (37.3 C) (08/05 1532) Temp Source: Oral (08/05 1442) BP: 155/89 mmHg (08/05 1532) Pulse Rate: 98 (08/05 1532) Intake/Output from previous day:   Intake/Output from this shift:    Labs:  Recent Labs  11/11/14 1241  WBC 13.5*  HGB 12.2  PLT 429*  CREATININE 0.70   Estimated Creatinine Clearance: 177 mL/min (by C-G formula based on Cr of 0.7). No results for input(s): VANCOTROUGH, VANCOPEAK, VANCORANDOM, GENTTROUGH, GENTPEAK, GENTRANDOM, TOBRATROUGH, TOBRAPEAK, TOBRARND, AMIKACINPEAK, AMIKACINTROU, AMIKACIN in the last 72 hours.   Microbiology: No results found for this or any previous visit (from the past 720 hour(s)).  Medical History: Past Medical History  Diagnosis Date  . Amenorrhea   . Dysmenorrhea   . COPD (chronic obstructive pulmonary disease)   . Dysrhythmia     DR Johnsie Cancel    . Asthma   . Sleep apnea     CPAP  . Shortness of breath     WITH EXERTION   . GERD (gastroesophageal reflux disease)     HEARTBURN   TUMS  . Neuromuscular disorder     RESTLESS LEG   . Depression   . Anxiety   . Obesity   . Morbid obesity   . Ectopic pregnancy 2013  . Eosinophilic esophagitis     Diagnosed at [facility] 06/16/2013, untreated  . Depression   . Anxiety   . Schizoaffective disorder, bipolar type   . Diabetes mellitus without complication    Anti-infectives    Start     Dose/Rate  Route Frequency Ordered Stop   11/11/14 1300  vancomycin (VANCOCIN) 2,000 mg in sodium chloride 0.9 % 500 mL IVPB     2,000 mg 250 mL/hr over 120 Minutes Intravenous  Once 11/11/14 1244 11/11/14 1549     Assessment: 39yo morbidly obese female presents to ED c/o worsening wound infection.  Pt has good renal fxn.  Asked to initiate Vancomycin.  Nurse reported that patient has Red-Man syndrome during Vancomycin infusion but no SOB.    Goal of Therapy:  Vancomycin trough level 10-15 mcg/ml  Plan:  Vancomycin 2000mg  IV now x 1 then Vancomycin 1500mg  IV q8h Check trough at steady state Benadryl 25mg  PO q8h, before each dose of Vancomycin Monitor labs, renal fxn, and c/s  Hart Robinsons A 11/11/2014,4:22 PM

## 2014-11-11 NOTE — Progress Notes (Signed)
ANTIBIOTIC CONSULT NOTE - INITIAL  Pharmacy Consult for Vancomycin / Aztreonam / Diflucan Indication: cellulitis  Allergies  Allergen Reactions  . Bee Venom Shortness Of Breath  . Nutritional Supplements Anaphylaxis  . Penicillins Anaphylaxis    REACTION: Angioedema  . Adhesive [Tape] Rash  . Latex Rash  . Vancomycin Other (See Comments)    Pt can tolerate Vancomycin but did cause Red-Man Syndrome.  Recommend to pre-medicate with Benadryl before doses administered.     Patient Measurements: Height: 5\' 3"  (160 cm) Weight: (!) 462 lb (209.562 kg) IBW/kg (Calculated) : 52.4  Vital Signs: Temp: 98 F (36.7 C) (08/05 1638) Temp Source: Oral (08/05 1442) BP: 156/74 mmHg (08/05 1638) Pulse Rate: 98 (08/05 1638) Intake/Output from previous day:   Intake/Output from this shift:    Labs:  Recent Labs  11/11/14 1241  WBC 13.5*  HGB 12.2  PLT 429*  CREATININE 0.70   Estimated Creatinine Clearance: 177 mL/min (by C-G formula based on Cr of 0.7). No results for input(s): VANCOTROUGH, VANCOPEAK, VANCORANDOM, GENTTROUGH, GENTPEAK, GENTRANDOM, TOBRATROUGH, TOBRAPEAK, TOBRARND, AMIKACINPEAK, AMIKACINTROU, AMIKACIN in the last 72 hours.   Microbiology: No results found for this or any previous visit (from the past 720 hour(s)).  Medical History: Past Medical History  Diagnosis Date  . Amenorrhea   . Dysmenorrhea   . COPD (chronic obstructive pulmonary disease)   . Dysrhythmia     DR Johnsie Cancel    . Asthma   . Sleep apnea     CPAP  . Shortness of breath     WITH EXERTION   . GERD (gastroesophageal reflux disease)     HEARTBURN   TUMS  . Neuromuscular disorder     RESTLESS LEG   . Depression   . Anxiety   . Obesity   . Morbid obesity   . Ectopic pregnancy 2013  . Eosinophilic esophagitis     Diagnosed at [facility] 06/16/2013, untreated  . Depression   . Anxiety   . Schizoaffective disorder, bipolar type   . Diabetes mellitus without complication    Anti-infectives     Start     Dose/Rate Route Frequency Ordered Stop   11/11/14 2200  vancomycin (VANCOCIN) 1,500 mg in sodium chloride 0.9 % 500 mL IVPB     1,500 mg 250 mL/hr over 120 Minutes Intravenous Every 8 hours 11/11/14 1805     11/11/14 1300  vancomycin (VANCOCIN) 2,000 mg in sodium chloride 0.9 % 500 mL IVPB     2,000 mg 250 mL/hr over 120 Minutes Intravenous  Once 11/11/14 1244 11/11/14 1549     Assessment: 39yo morbidly obese female presents to ED c/o worsening wound infection.  Pt has good renal fxn.  Asked to initiate Vancomycin.  Nurse reported that patient has Red-Man syndrome during Vancomycin infusion but no SOB.    Goal of Therapy:  Vancomycin trough level 10-15 mcg/ml  Plan:  Vancomycin 2000mg  IV now x 1 then Vancomycin 1500mg  IV q8h Check trough at steady state Benadryl 25mg  PO q8h, before each dose of Vancomycin Monitor labs, renal fxn, and c/s  Pricilla Larsson 11/11/2014,6:16 PM   Addendum: Add Diflucan and Aztreonam (no Zosyn due to PCN allergy).  Pricilla Larsson, [facility]

## 2014-11-11 NOTE — ED Notes (Signed)
Pt face and neck increasingly reddened. Dr. Marin Comment at bedside and aware. No benadryl ordered at present. Pharmacy notified and with update pt intolerance list.

## 2014-11-11 NOTE — ED Provider Notes (Signed)
CSN: [id number]     Arrival date & time 11/11/14  1143 History   First MD Initiated Contact with Patient 11/11/14 1211     Chief Complaint  Patient presents with  . Wound Infection      HPI Pt was seen at 1225. Per pt, c/o gradual onset and worsening of persistent "rash" to abd that began 2 weeks ago. Pt was evaluated in the ED on 10/29/14 for same, dx cellulitis and rx clindamycin. Pt states she has been taking the abx as prescribed "but the rash is getting worse." Unknown fevers. Denies other areas of rash. Denies abd pain, no N/V/D, no back pain, no perineal pain.    Past Medical History  Diagnosis Date  . Amenorrhea   . Dysmenorrhea   . COPD (chronic obstructive pulmonary disease)   . Dysrhythmia     DR Johnsie Cancel    . Asthma   . Sleep apnea     CPAP  . Shortness of breath     WITH EXERTION   . GERD (gastroesophageal reflux disease)     HEARTBURN   TUMS  . Neuromuscular disorder     RESTLESS LEG   . Depression   . Anxiety   . Obesity   . Morbid obesity   . Ectopic pregnancy 2013  . Eosinophilic esophagitis     Diagnosed at [facility] 06/16/2013, untreated  . Depression   . Anxiety   . Schizoaffective disorder, bipolar type   . Diabetes mellitus without complication    Past Surgical History  Procedure Laterality Date  . Cholecystectomy    . Tonsillectomy    . Tooth extraction  10/28/2011    Procedure: DENTAL RESTORATION/EXTRACTIONS;  Surgeon: Gae Bon, DDS;  Location: [place];  Service: Oral Surgery;;  . Dental surgery    . Esophagogastroduodenoscopy  May 2007    Dr. Gala Romney: Normal esophagus, stomach, D1, D2  . Esophagogastroduodenoscopy  06/16/2013    Dr. Carlton Adam, eosinophilic esophagitis, reactive gastropathy, no esophageal dilation   Family History  Problem Relation Age of Onset  . Depression Mother   . Hypertension Sister   . Diabetes Paternal Uncle   . Anxiety disorder Paternal Uncle   . Colon cancer Maternal Grandmother     36s  . Liver disease Neg Hx     History  Substance Use Topics  . Smoking status: Former Smoker -- 0.50 packs/day for 8 years    Types: Cigarettes    Quit date: 04/25/2011  . Smokeless tobacco: Never Used  . Alcohol Use: No     Comment: occasional    OB History    Gravida Para Term Preterm AB TAB SAB Ectopic Multiple Living   1    1  1         Review of Systems ROS: Statement: All systems negative except as marked or noted in the HPI; Constitutional: Negative for fever and chills. ; ; Eyes: Negative for eye pain, redness and discharge. ; ; ENMT: Negative for ear pain, hoarseness, nasal congestion, sinus pressure and sore throat. ; ; Cardiovascular: Negative for chest pain, palpitations, diaphoresis, dyspnea and peripheral edema. ; ; Respiratory: Negative for cough, wheezing and stridor. ; ; Gastrointestinal: Negative for nausea, vomiting, diarrhea, abdominal pain, blood in stool, hematemesis, jaundice and rectal bleeding. . ; ; Genitourinary: Negative for dysuria, flank pain and hematuria. ; ; Musculoskeletal: Negative for back pain and neck pain. Negative for swelling and trauma.; ; Skin: +rash. Negative for pruritus, abrasions, blisters, bruising and skin lesion.; ;  Neuro: Negative for headache, lightheadedness and neck stiffness. Negative for weakness, altered level of consciousness , altered mental status, extremity weakness, paresthesias, involuntary movement, seizure and syncope.      Allergies  Bee venom; Nutritional supplements; Penicillins; Adhesive; and Latex  Home Medications   Prior to Admission medications   Medication Sig Start Date End Date Taking? Authorizing Provider  buPROPion (WELLBUTRIN XL) 300 MG 24 hr tablet Take 300 mg by mouth daily. 04/28/14  Yes Historical Provider, MD  busPIRone (BUSPAR) 10 MG tablet Take 15 mg by mouth 2 (two) times daily. For anxiety   Yes Historical Provider, MD  clindamycin (CLEOCIN) 300 MG capsule Take 1 capsule (300 mg total) by mouth 4 (four) times daily. 10/29/14  11/12/14 Yes Merrily Pew, MD  dicyclomine (BENTYL) 20 MG tablet Take 1 tablet (20 mg total) by mouth 3 (three) times daily before meals. As needed for abdominal cramps or diarrhea 08/25/14  Yes Mahala Menghini, PA-C  fluticasone [facility]) 50 MCG/ACT nasal spray Place 1-2 sprays into both nostrils daily. 10/11/14  Yes Historical Provider, MD  furosemide (LASIX) 40 MG tablet Take 40 mg by mouth daily.  06/10/14  Yes Historical Provider, MD  loratadine (CLARITIN) 10 MG tablet Take 10 mg by mouth daily.   Yes Historical Provider, MD  medroxyPROGESTERone (PROVERA) 10 MG tablet 2 tablets daily. For five days each month 07/05/14  Yes Historical Provider, MD  metFORMIN (GLUCOPHAGE) 500 MG tablet Take 500 mg by mouth daily with breakfast.   Yes Historical Provider, MD  mometasone (ASMANEX) 220 MCG/INH inhaler Inhale 2 puffs into the lungs daily.   Yes Historical Provider, MD  montelukast (SINGULAIR) 10 MG tablet Take 10 mg by mouth at bedtime.   Yes Historical Provider, MD  pantoprazole (PROTONIX) 40 MG tablet TAKE ONE TABLET BY MOUTH DAILY 11/03/14  Yes Mahala Menghini, PA-C  potassium chloride (MICRO-K) 10 MEQ CR capsule Take 10 mEq by mouth 2 (two) times daily.   Yes Historical Provider, MD  Prenatal Vit-Fe Fumarate-FA (PRENATAL MULTIVITAMIN) TABS Take 1 tablet by mouth daily.    Yes Historical Provider, MD  rOPINIRole (REQUIP) 0.5 MG tablet Take 1 mg by mouth at bedtime.  07/04/14  Yes Historical Provider, MD  vitamin E 400 UNIT capsule Take 400 Units by mouth daily.   Yes Historical Provider, MD  ziprasidone (GEODON) 60 MG capsule Take 60 mg by mouth every evening.    Yes Historical Provider, MD  acetaminophen (TYLENOL) 500 MG tablet Take 1,000 mg by mouth every 6 (six) hours as needed. pain    Historical Provider, MD  albuterol (PROVENTIL HFA;VENTOLIN HFA) 108 (90 BASE) MCG/ACT inhaler Inhale 1-2 puffs into the lungs every 6 (six) hours as needed for wheezing. 11/15/12   Hope Bunnie Pion, NP  EPIPEN 2-PAK 0.3 MG/0.3ML  SOAJ injection Inject 0.3 mLs into the muscle as needed. For allergic reaction 07/29/14   Historical Provider, MD  naproxen sodium (ANAPROX) 220 MG tablet Take 440 mg by mouth 2 (two) times daily as needed (pain).    Historical Provider, MD  ondansetron (ZOFRAN ODT) 4 MG disintegrating tablet 4mg  ODT q4 hours prn nausea/vomit Patient taking differently: Take 4 mg by mouth every 4 (four) hours as needed for nausea or vomiting. 4mg  ODT q4 hours prn nausea/vomit 10/29/14   Merrily Pew, MD  potassium chloride (K-DUR) 10 MEQ tablet Take 1 tablet (10 mEq total) by mouth 2 (two) times daily. For potassium replacement (Lasix causes potassium loss) Patient not taking: Reported on  11/11/2014 08/12/11   Darrol Jump, MD  traMADol (ULTRAM) 50 MG tablet Take 50 mg by mouth every 6 (six) hours as needed for moderate pain.    Historical Provider, MD   BP 156/100 mmHg  Pulse 105  Temp(Src) 98.1 F (36.7 C) (Oral)  Resp 20  Ht 5\' 3"  (1.6 m)  Wt 462 lb (209.562 kg)  BMI 81.86 kg/m2  SpO2 99%  LMP 10/24/2014 Physical Exam  1230: Physical examination:  Nursing notes reviewed; Vital signs and O2 SAT reviewed;  Constitutional: Well developed, Well nourished, Well hydrated, In no acute distress; Head:  Normocephalic, atraumatic; Eyes: EOMI, PERRL, No scleral icterus; ENMT: Mouth and pharynx normal, Mucous membranes moist; Neck: Supple, Full range of motion, No lymphadenopathy; Cardiovascular: Regular rate and rhythm, No gallop; Respiratory: Breath sounds clear & equal bilaterally, No wheezes.  Speaking full sentences with ease, Normal respiratory effort/excursion; Chest: Nontender, Movement normal; Abdomen: Soft, morbidly obese. Nontender, Nondistended, Normal bowel sounds. +erythema underneath right pannus that extends from anterior-lateral abd wall to anterior-medial right thigh. +approximately 4x4cm shallow wound to right pannus with granulation tissue, no bleeding, no drainage, no fluctuance.;; Genitourinary: No CVA  tenderness. No perineal erythema.; Extremities: Pulses normal, No tenderness, +3 pedal edema, No calf asymmetry.; Neuro: AA&Ox3, Major CN grossly intact.  Speech clear. No gross focal motor or sensory deficits in extremities.; Skin: Color normal, Warm, Dry.   ED Course  Procedures     EKG Interpretation None      MDM  MDM Reviewed: previous chart, nursing note and vitals Reviewed previous: labs Interpretation: labs   Results for orders placed or performed during the hospital encounter of 62/95/28  Basic metabolic panel  Result Value Ref Range   Sodium 136 135 - 145 mmol/L   Potassium 4.2 3.5 - 5.1 mmol/L   Chloride 102 101 - 111 mmol/L   CO2 28 22 - 32 mmol/L   Glucose, Bld 99 65 - 99 mg/dL   BUN 10 6 - 20 mg/dL   Creatinine, Ser 0.70 0.44 - 1.00 mg/dL   Calcium 9.0 8.9 - 10.3 mg/dL   GFR calc non Af Amer >60 >60 mL/min   GFR calc Af Amer >60 >60 mL/min   Anion gap 6 5 - 15  Lactic acid, plasma  Result Value Ref Range   Lactic Acid, Venous 1.5 0.5 - 2.0 mmol/L  CBC with Differential  Result Value Ref Range   WBC 13.5 (H) 4.0 - 10.5 K/uL   RBC 4.54 3.87 - 5.11 MIL/uL   Hemoglobin 12.2 12.0 - 15.0 g/dL   HCT 39.2 36.0 - 46.0 %   MCV 86.3 78.0 - 100.0 fL   MCH 26.9 26.0 - 34.0 pg   MCHC 31.1 30.0 - 36.0 g/dL   RDW 15.4 11.5 - 15.5 %   Platelets 429 (H) 150 - 400 K/uL   Neutrophils Relative % 72 43 - 77 %   Neutro Abs 9.8 (H) 1.7 - 7.7 K/uL   Lymphocytes Relative 19 12 - 46 %   Lymphs Abs 2.5 0.7 - 4.0 K/uL   Monocytes Relative 6 3 - 12 %   Monocytes Absolute 0.8 0.1 - 1.0 K/uL   Eosinophils Relative 3 0 - 5 %   Eosinophils Absolute 0.4 0.0 - 0.7 K/uL   Basophils Relative 0 0 - 1 %   Basophils Absolute 0.0 0.0 - 0.1 K/uL  Pregnancy, urine  Result Value Ref Range   Preg Test, Ur NEGATIVE NEGATIVE  Urinalysis, Routine w  reflex microscopic (not at [facility])  Result Value Ref Range   Color, Urine YELLOW YELLOW   APPearance HAZY (A) CLEAR   Specific Gravity, Urine  1.020 1.005 - 1.030   pH 6.0 5.0 - 8.0   Glucose, UA NEGATIVE NEGATIVE mg/dL   Hgb urine dipstick MODERATE (A) NEGATIVE   Bilirubin Urine NEGATIVE NEGATIVE   Ketones, ur NEGATIVE NEGATIVE mg/dL   Protein, ur NEGATIVE NEGATIVE mg/dL   Urobilinogen, UA 0.2 0.0 - 1.0 mg/dL   Nitrite NEGATIVE NEGATIVE   Leukocytes, UA SMALL (A) NEGATIVE  Urine microscopic-add on  Result Value Ref Range   Squamous Epithelial / LPF FEW (A) RARE   WBC, UA 3-6 <3 WBC/hpf   RBC / HPF 7-10 <3 RBC/hpf    US Pelvis Limited 11/11/2014   CLINICAL DATA:  39 year old morbidly obese female with soft tissue wound along the underside of the patient's abdominal panniculus with pain and burning for 2 weeks. Recent Serosanguineous discharge. Initial encounter.  EXAM: LIMITED ABDOMINAL ULTRASOUND  COMPARISON:  Lumbar radiographs 06/02/2014.  FINDINGS: Color and gray scale ultrasound scanning in the area of pain along the underside of the panniculus to the right of midline. About 2 mm deep to the skin surface there is a fairly expansive area of heterogeneous hypo echogenicity encompassing up to 4 cm diameter. However, some of this does demonstrate internal vascularity (image 9). Evidence of surrounding subcutaneous edema.  IMPRESSION: 4 cm abnormal area in the subcutaneous fat most compatible with phlegmon. This might be the residual of a spontaneously drained subcutaneous abscess in this setting.   Electronically Signed   By: Genevie Ann M.D.   On: 11/11/2014 15:33     1425:  IV vancomycin given for cellulitis. No palp abscess in pannus and no drainage from abd wall wound. Will [place] abd wall. Will admit due to outpt treatment failure. Dx and testing d/w pt.  Questions answered.  Verb understanding, agreeable to admit.  T/C to [facility]. Marin Comment, case discussed, including:  HPI, pertinent PM/SHx, VS/PE, dx testing, ED course and treatment:  Agreeable to admit, requests to write temporary orders, obtain observation medical bed to team  APAdmits.      Francine Graven, DO 11/14/14 1420

## 2014-11-11 NOTE — ED Notes (Signed)
Pt states that she was seen a week ago for abscess and cellulitis of abdomen.  States was supposed to follow up with wound care but cannot get an appointment and the redness is spreading and drainage is worse.

## 2014-11-12 DIAGNOSIS — L03311 Cellulitis of abdominal wall: Principal | ICD-10-CM

## 2014-11-12 DIAGNOSIS — K219 Gastro-esophageal reflux disease without esophagitis: Secondary | ICD-10-CM

## 2014-11-12 DIAGNOSIS — F310 Bipolar disorder, current episode hypomanic: Secondary | ICD-10-CM

## 2014-11-12 DIAGNOSIS — T148 Other injury of unspecified body region: Secondary | ICD-10-CM

## 2014-11-12 LAB — BASIC METABOLIC PANEL
ANION GAP: 10 (ref 5–15)
BUN: 9 mg/dL (ref 6–20)
CO2: 25 mmol/L (ref 22–32)
Calcium: 8.6 mg/dL — ABNORMAL LOW (ref 8.9–10.3)
Chloride: 104 mmol/L (ref 101–111)
Creatinine, Ser: 0.54 mg/dL (ref 0.44–1.00)
GFR calc non Af Amer: >60 mL/min (ref >60)
Glucose, Bld: 87 mg/dL (ref 65–99)
Potassium: 3.9 mmol/L (ref 3.5–5.1)
SODIUM: 139 mmol/L (ref 135–145)

## 2014-11-12 LAB — CBC
HEMATOCRIT: 35.4 % — AB (ref 36.0–46.0)
Hemoglobin: 11.1 g/dL — ABNORMAL LOW (ref 12.0–15.0)
MCH: 27.1 pg (ref 26.0–34.0)
MCHC: 31.4 g/dL (ref 30.0–36.0)
MCV: 86.3 fL (ref 78.0–100.0)
Platelets: 395 10*3/uL (ref 150–400)
RBC: 4.1 MIL/uL (ref 3.87–5.11)
RDW: 15.5 % (ref 11.5–15.5)
WBC: 11.4 10*3/uL — ABNORMAL HIGH (ref 4.0–10.5)

## 2014-11-12 LAB — GLUCOSE, CAPILLARY: Glucose-Capillary: 120 mg/dL — ABNORMAL HIGH (ref 65–99)

## 2014-11-12 LAB — HEMOGLOBIN A1C
HEMOGLOBIN A1C: 5.8 % — AB (ref 4.8–5.6)
Mean Plasma Glucose: 120 mg/dL

## 2014-11-12 MED ORDER — FLUCONAZOLE 100 MG PO TABS
400.0000 mg | ORAL_TABLET | Freq: Every day | ORAL | Status: DC
  Administered 2014-11-12 – 2014-11-13 (×2): 400 mg via ORAL
  Filled 2014-11-12 (×2): qty 4

## 2014-11-12 NOTE — Progress Notes (Signed)
[facility] PROGRESS NOTE  Heather [name] YDX:[id number] DOB: 1975/10/03    PCP:   [name], MD   HPI: Heather [name] is an 39 y.o. female admitted for panniculitis, with hx DM, schizoaffective disorder of bipolar type, COPD, morbid obesity, asthma, dysrythmia, and was started on IV Van/Aztreonam along with oral Diflucan.  She is doing better today with receding of her cellulitis.     Rewiew of Systems:  Constitutional: Negative for malaise, fever and chills. No significant weight loss or weight gain Eyes: Negative for eye pain, redness and discharge, diplopia, visual changes, or flashes of light. ENMT: Negative for ear pain, hoarseness, nasal congestion, sinus pressure and sore throat. No headaches; tinnitus, drooling, or problem swallowing. Cardiovascular: Negative for chest pain, palpitations, diaphoresis, dyspnea and peripheral edema. ; No orthopnea, PND Respiratory: Negative for cough, hemoptysis, wheezing and stridor. No pleuritic chestpain. Gastrointestinal: Negative for nausea, vomiting, diarrhea, constipation, abdominal pain, melena, blood in stool, hematemesis, jaundice and rectal bleeding.    Genitourinary: Negative for frequency, dysuria, incontinence,flank pain and hematuria; Musculoskeletal: Negative for back pain and neck pain. Negative for swelling and trauma.;  Skin: . Negative for pruritus, rash, abrasions, bruising and skin lesion.; ulcerations Neuro: Negative for headache, lightheadedness and neck stiffness. Negative for weakness, altered level of consciousness , altered mental status, extremity weakness, burning feet, involuntary movement, seizure and syncope.  Psych: negative for anxiety, depression, insomnia, tearfulness, panic attacks, hallucinations, paranoia, suicidal or homicidal ideation    Past Medical History  Diagnosis Date  . Amenorrhea   . Dysmenorrhea   . COPD (chronic obstructive pulmonary disease)   . Dysrhythmia     DR Johnsie Cancel    .  Asthma   . Sleep apnea     CPAP  . Shortness of breath     WITH EXERTION   . GERD (gastroesophageal reflux disease)     HEARTBURN   TUMS  . Neuromuscular disorder     RESTLESS LEG   . Depression   . Anxiety   . Obesity   . Morbid obesity   . Ectopic pregnancy 2013  . Eosinophilic esophagitis     Diagnosed at [facility] 06/16/2013, untreated  . Depression   . Anxiety   . Schizoaffective disorder, bipolar type   . Diabetes mellitus without complication     Past Surgical History  Procedure Laterality Date  . Cholecystectomy    . Tonsillectomy    . Tooth extraction  10/28/2011    Procedure: DENTAL RESTORATION/EXTRACTIONS;  Surgeon: Gae Bon, DDS;  Location: [place];  Service: Oral Surgery;;  . Dental surgery    . Esophagogastroduodenoscopy  May 2007    Dr. Gala Romney: Normal esophagus, stomach, D1, D2  . Esophagogastroduodenoscopy  06/16/2013    Dr. Carlton Adam, eosinophilic esophagitis, reactive gastropathy, no esophageal dilation    Medications:  HOME MEDS: Prior to Admission medications   Medication Sig Start Date End Date Taking? Authorizing Provider  buPROPion (WELLBUTRIN XL) 300 MG 24 hr tablet Take 300 mg by mouth daily. 04/28/14  Yes Historical Provider, MD  busPIRone (BUSPAR) 10 MG tablet Take 15 mg by mouth 2 (two) times daily. For anxiety   Yes Historical Provider, MD  clindamycin (CLEOCIN) 300 MG capsule Take 1 capsule (300 mg total) by mouth 4 (four) times daily. 10/29/14 11/12/14 Yes Merrily Pew, MD  dicyclomine (BENTYL) 20 MG tablet Take 1 tablet (20 mg total) by mouth 3 (three) times daily before meals. As needed for abdominal cramps or diarrhea 08/25/14  Yes  Mahala Menghini, PA-C  fluticasone (FLONASE) 50 MCG/ACT nasal spray Place 1-2 sprays into both nostrils daily. 10/11/14  Yes Historical Provider, MD  furosemide (LASIX) 40 MG tablet Take 40 mg by mouth daily.  06/10/14  Yes Historical Provider, MD  loratadine (CLARITIN) 10 MG tablet Take 10 mg by mouth daily.   Yes  Historical Provider, MD  medroxyPROGESTERone (PROVERA) 10 MG tablet 2 tablets daily. For five days each month 07/05/14  Yes Historical Provider, MD  metFORMIN (GLUCOPHAGE) 500 MG tablet Take 500 mg by mouth daily with breakfast.   Yes Historical Provider, MD  mometasone (ASMANEX) 220 MCG/INH inhaler Inhale 2 puffs into the lungs daily.   Yes Historical Provider, MD  montelukast (SINGULAIR) 10 MG tablet Take 10 mg by mouth at bedtime.   Yes Historical Provider, MD  pantoprazole (PROTONIX) 40 MG tablet TAKE ONE TABLET BY MOUTH DAILY 11/03/14  Yes Mahala Menghini, PA-C  potassium chloride (MICRO-K) 10 MEQ CR capsule Take 10 mEq by mouth 2 (two) times daily.   Yes Historical Provider, MD  Prenatal Vit-Fe Fumarate-FA (PRENATAL MULTIVITAMIN) TABS Take 1 tablet by mouth daily.    Yes Historical Provider, MD  rOPINIRole (REQUIP) 0.5 MG tablet Take 1 mg by mouth at bedtime.  07/04/14  Yes Historical Provider, MD  vitamin E 400 UNIT capsule Take 400 Units by mouth daily.   Yes Historical Provider, MD  ziprasidone (GEODON) 60 MG capsule Take 60 mg by mouth every evening.    Yes Historical Provider, MD  acetaminophen (TYLENOL) 500 MG tablet Take 1,000 mg by mouth every 6 (six) hours as needed. pain    Historical Provider, MD  albuterol (PROVENTIL HFA;VENTOLIN HFA) 108 (90 BASE) MCG/ACT inhaler Inhale 1-2 puffs into the lungs every 6 (six) hours as needed for wheezing. 11/15/12   Hope Bunnie Pion, NP  EPIPEN 2-PAK 0.3 MG/0.3ML SOAJ injection Inject 0.3 mLs into the muscle as needed. For allergic reaction 07/29/14   Historical Provider, MD  naproxen sodium (ANAPROX) 220 MG tablet Take 440 mg by mouth 2 (two) times daily as needed (pain).    Historical Provider, MD  ondansetron (ZOFRAN ODT) 4 MG disintegrating tablet 4mg  ODT q4 hours prn nausea/vomit Patient taking differently: Take 4 mg by mouth every 4 (four) hours as needed for nausea or vomiting. 4mg  ODT q4 hours prn nausea/vomit 10/29/14   Merrily Pew, MD  traMADol  (ULTRAM) 50 MG tablet Take 50 mg by mouth every 6 (six) hours as needed for moderate pain.    Historical Provider, MD     Allergies:  Allergies  Allergen Reactions  . Bee Venom Shortness Of Breath  . Nutritional Supplements Anaphylaxis  . Penicillins Anaphylaxis    REACTION: Angioedema  . Adhesive [Tape] Rash  . Latex Rash  . Vancomycin Other (See Comments)    Pt can tolerate Vancomycin but did cause Red-Man Syndrome.  Recommend to pre-medicate with Benadryl before doses administered.      Social History:   reports that she quit smoking about 3 years ago. Her smoking use included Cigarettes. She has a 4 pack-year smoking history. She has never used smokeless tobacco. She reports that she does not drink alcohol or use illicit drugs.  Family History: Family History  Problem Relation Age of Onset  . Depression Mother   . Hypertension Sister   . Diabetes Paternal Uncle   . Anxiety disorder Paternal Uncle   . Colon cancer Maternal Grandmother     14s  . Liver disease Neg  Hx      Physical Exam: Filed Vitals:   11/11/14 2143 11/12/14 0443 11/12/14 0733 11/12/14 1100  BP: 126/75 127/66  118/75  Pulse: 89 83  86  Temp: 98.8 F (37.1 C) 97.9 F (36.6 C)  98.4 F (36.9 C)  TempSrc: Oral Oral  Oral  Resp: 18 18  16   Height:      Weight:      SpO2: 97% 97% 97% 100%   Blood pressure 118/75, pulse 86, temperature 98.4 F (36.9 C), temperature source Oral, resp. rate 16, height 5\' 3"  (1.6 m), weight 209.562 kg (462 lb), last menstrual period 10/24/2014, SpO2 100 %.  GEN:  Pleasant  patient lying in the stretcher in no acute distress; cooperative with exam. PSYCH:  alert and oriented x4; does not appear anxious or depressed; affect is appropriate. HEENT: Mucous membranes pink and anicteric; PERRLA; EOM intact; no cervical lymphadenopathy nor thyromegaly or carotid bruit; no JVD; There were no stridor. Neck is very supple. Breasts:: Not examined CHEST WALL: No tenderness CHEST:  Normal respiration, clear to auscultation bilaterally.  HEART: Regular rate and rhythm.  There are no murmur, rub, or gallops.   BACK: No kyphosis or scoliosis; no CVA tenderness ABDOMEN: soft and non-tender; no masses, no organomegaly, normal abdominal bowel sounds; no pannus; no intertriginous candida. There is no rebound and no distention. Rectal Exam: Not done EXTREMITIES: No bone or joint deformity; age-appropriate arthropathy of the hands and knees; no edema; no ulcerations.  There is no calf tenderness. Genitalia: not examined PULSES: 2+ and symmetric SKIN: Normal hydration no rash or ulceration CNS: Cranial nerves 2-12 grossly intact no focal lateralizing neurologic deficit.  Speech is fluent; uvula elevated with phonation, facial symmetry and tongue midline. DTR are normal bilaterally, cerebella exam is intact, barbinski is negative and strengths are equaled bilaterally.  No sensory loss.   Labs on Admission:  Basic Metabolic Panel:  Recent Labs Lab 11/11/14 1241 11/12/14 0541  NA 136 139  K 4.2 3.9  CL 102 104  CO2 28 25  GLUCOSE 99 87  BUN 10 9  CREATININE 0.70 0.54  CALCIUM 9.0 8.6*   CBC:  Recent Labs Lab 11/11/14 1241 11/12/14 0541  WBC 13.5* 11.4*  NEUTROABS 9.8*  --   HGB 12.2 11.1*  HCT 39.2 35.4*  MCV 86.3 86.3  PLT 429* 395   Cardiac Enzymes: No results for input(s): CKTOTAL, CKMB, CKMBINDEX, TROPONINI in the last 168 hours.  CBG:  Recent Labs Lab 11/12/14 0834  GLUCAP 120*     Radiological Exams on Admission: US Pelvis Limited  11/11/2014   CLINICAL DATA:  39 year old morbidly obese female with soft tissue wound along the underside of the patient's abdominal panniculus with pain and burning for 2 weeks. Recent Serosanguineous discharge. Initial encounter.  EXAM: LIMITED ABDOMINAL ULTRASOUND  COMPARISON:  Lumbar radiographs 06/02/2014.  FINDINGS: Color and gray scale ultrasound scanning in the area of pain along the underside of the panniculus  to the right of midline. About 2 mm deep to the skin surface there is a fairly expansive area of heterogeneous hypo echogenicity encompassing up to 4 cm diameter. However, some of this does demonstrate internal vascularity (image 9). Evidence of surrounding subcutaneous edema.  IMPRESSION: 4 cm abnormal area in the subcutaneous fat most compatible with phlegmon. This might be the residual of a spontaneously drained subcutaneous abscess in this setting.   Electronically Signed   By: Genevie Ann M.D.   On: 11/11/2014 15:33  Assessment/Plan Present on Admission:  . Morbid obesity . Bipolar disorder . GERD . Cellulitis . Sepsis . Cellulitis, abdominal wall  PLAN:  Will continue with current Tx.  She is doing better.  For her small superficial ulcer, will do wet to dry dressing.  Her regimen included Van/Aztreonam (anaphylaxis to PCN), and oral diflucan.  Thanks.  Other plans as per orders.  Code Status: FULL Haskel Khan, MD. [facility] Pager [phone] 7pm to 7am.  11/12/2014, 2:03 PM

## 2014-11-13 LAB — VANCOMYCIN, TROUGH: Vancomycin Tr: 35 ug/mL (ref 10.0–20.0)

## 2014-11-13 MED ORDER — VANCOMYCIN HCL IN DEXTROSE 1-5 GM/200ML-% IV SOLN
1000.0000 mg | Freq: Two times a day (BID) | INTRAVENOUS | Status: DC
  Filled 2014-11-13 (×2): qty 200

## 2014-11-13 MED ORDER — DIPHENHYDRAMINE HCL 25 MG PO CAPS: 25.0000 mg | ORAL_CAPSULE | Freq: Two times a day (BID) | ORAL | Status: DC

## 2014-11-13 MED ORDER — SULFAMETHOXAZOLE-TRIMETHOPRIM 800-160 MG PO TABS: 1.0000 | ORAL_TABLET | Freq: Two times a day (BID) | ORAL | Status: DC

## 2014-11-13 NOTE — Progress Notes (Signed)
Utilization review Completed Macey Wurtz RN BSN   

## 2014-11-13 NOTE — Discharge Summary (Signed)
Physician Discharge Summary  Heather [name] HKV:[id number] DOB: [birth year] DOA: 11/11/2014  PCP: [name], MD  Admit date: 11/11/2014 Discharge date: 11/13/2014  Time spent: 35 minutes  Recommendations for Outpatient Follow-up:  1. Follow up with your PCP in one week.   Discharge Diagnoses:  Principal Problem:   Sepsis Active Problems:   Morbid obesity   Bipolar disorder   GERD   Cellulitis   Open wound   Cellulitis, abdominal wall   Discharge Condition: improved.   Diet recommendation: Carb modified diet, cardiac healthy diet.   Filed Weights   11/11/14 1147  Weight: 209.562 kg (462 lb)    History of present illness: Patient was admitted by Dyanne Carrel NP C and I on August 5th, 2016 for cellulitis and panniculitis.  As per her H and P:  " Heather [name] is a 39 y.o. female with a past medical history that includes COPD, GERD, morbid obesity, schizoaffective disorder bipolar type, hypertension since emergency department with chief complaint of worsening wound infection. Initial evaluation in the emergency department reveals sepsis with leukocytosis tachycardia fever and an open draining wound. Patient reports for the last 2 weeks she has been treating a quarter size wound in her pannus. She has been on clindamycin with little improvement last 3 days the "redness has spread and a week ago it opened and drained all over me" associated symptoms include nausea with one episode of emesis fever chills. She denies chest pain palpitation shortness of breath headache dizziness syncope or near-syncope. She denies abdominal pain dysuria hematuria frequency or urgency. She does report chronic back pain after a fall. Workup in the emergency department reveals WBCs of 13.5 platelets 429 otherwise her lab work is unremarkable. She is tachycardic with a fever of 99.3 and the leukocytosis. She is not hypoxic. In the emergency department she is provided with vancomycin and IV fluids. Her lactic  acid is within the limits of normal  Hospital Course: Patient was admitted and started on IV vancomycin and Aztreonam as she has anaphylaxis with PCN.  An ultrasound of her lower abdomen was done and it showed no abscess, but she did have a plegmon.  She could not have a CT as her size precluded this test.  Her wound improved with IV antibiotics.  She was seen in consultation with [place] and was recommended dressing change, along with absorbing supplies were given.  She also was started on oral Diflucan in case she has concomitant candida cruri.  She is stable for discharged, and will be discharged today on Bactrim DS for another 5 days.  If she develop rash, nausea, or vomiting, she should stop Bactrim and call a provider immediately.  She will follow up with her PCP in one week. Thank you and Good day.    Consultations:  Wound care.   Discharge Exam: Filed Vitals:   11/13/14 0541  BP: 129/63  Pulse: 70  Temp: 98 F (36.7 C)  Resp: 18    Discharge Instructions   Discharge Instructions    Diet - low sodium heart healthy    Complete by:  As directed      Discharge instructions    Complete by:  As directed   Continue with wound care as instructed.  Take your medications as discussed.     Increase activity slowly    Complete by:  As directed           Current Discharge Medication List    START taking these medications  Details  sulfamethoxazole-trimethoprim (BACTRIM DS,SEPTRA DS) 800-160 MG per tablet Take 1 tablet by mouth 2 (two) times daily. Qty: 10 tablet, Refills: 0      CONTINUE these medications which have NOT CHANGED   Details  buPROPion (WELLBUTRIN XL) 300 MG 24 hr tablet Take 300 mg by mouth daily. Refills: 3    busPIRone (BUSPAR) 10 MG tablet Take 15 mg by mouth 2 (two) times daily. For anxiety    dicyclomine (BENTYL) 20 MG tablet Take 1 tablet (20 mg total) by mouth 3 (three) times daily before meals. As needed for abdominal cramps or diarrhea Qty: 90 tablet,  Refills: 2    fluticasone (FLONASE) 50 MCG/ACT nasal spray Place 1-2 sprays into both nostrils daily. Refills: 3    furosemide (LASIX) 40 MG tablet Take 40 mg by mouth daily.  Refills: 0    medroxyPROGESTERone (PROVERA) 10 MG tablet 2 tablets daily. For five days each month    metFORMIN (GLUCOPHAGE) 500 MG tablet Take 500 mg by mouth daily with breakfast.   Associated Diagnoses: Leukocytosis; Thrombocytosis    mometasone (ASMANEX) 220 MCG/INH inhaler Inhale 2 puffs into the lungs daily.    montelukast (SINGULAIR) 10 MG tablet Take 10 mg by mouth at bedtime.    pantoprazole (PROTONIX) 40 MG tablet TAKE ONE TABLET BY MOUTH DAILY Qty: 30 tablet, Refills: 11    potassium chloride (MICRO-K) 10 MEQ CR capsule Take 10 mEq by mouth 2 (two) times daily.    Prenatal Vit-Fe Fumarate-FA (PRENATAL MULTIVITAMIN) TABS Take 1 tablet by mouth daily.     rOPINIRole (REQUIP) 0.5 MG tablet Take 1 mg by mouth at bedtime.  Refills: 5    ziprasidone (GEODON) 60 MG capsule Take 60 mg by mouth every evening.     acetaminophen (TYLENOL) 500 MG tablet Take 1,000 mg by mouth every 6 (six) hours as needed. pain    albuterol (PROVENTIL HFA;VENTOLIN HFA) 108 (90 BASE) MCG/ACT inhaler Inhale 1-2 puffs into the lungs every 6 (six) hours as needed for wheezing. Qty: 1 Inhaler, Refills: 0      STOP taking these medications     clindamycin (CLEOCIN) 300 MG capsule      loratadine (CLARITIN) 10 MG tablet      vitamin E 400 UNIT capsule      EPIPEN 2-PAK 0.3 MG/0.3ML SOAJ injection      naproxen sodium (ANAPROX) 220 MG tablet      ondansetron (ZOFRAN ODT) 4 MG disintegrating tablet      traMADol (ULTRAM) 50 MG tablet        Allergies  Allergen Reactions  . Bee Venom Shortness Of Breath  . Nutritional Supplements Anaphylaxis  . Penicillins Anaphylaxis    REACTION: Angioedema  . Adhesive [Tape] Rash  . Latex Rash  . Vancomycin Other (See Comments)    Pt can tolerate Vancomycin but did cause  Red-Man Syndrome.  Recommend to pre-medicate with Benadryl before doses administered.        The results of significant diagnostics from this hospitalization (including imaging, microbiology, ancillary and laboratory) are listed below for reference.    Significant Diagnostic Studies: US Pelvis Limited  11/11/2014   CLINICAL DATA:  39 year old morbidly obese female with soft tissue wound along the underside of the patient's abdominal panniculus with pain and burning for 2 weeks. Recent Serosanguineous discharge. Initial encounter.  EXAM: LIMITED ABDOMINAL ULTRASOUND  COMPARISON:  Lumbar radiographs 06/02/2014.  FINDINGS: Color and gray scale ultrasound scanning in the area of pain along the underside  of the panniculus to the right of midline. About 2 mm deep to the skin surface there is a fairly expansive area of heterogeneous hypo echogenicity encompassing up to 4 cm diameter. However, some of this does demonstrate internal vascularity (image 9). Evidence of surrounding subcutaneous edema.  IMPRESSION: 4 cm abnormal area in the subcutaneous fat most compatible with phlegmon. This might be the residual of a spontaneously drained subcutaneous abscess in this setting.   Electronically Signed   By: Genevie Ann M.D.   On: 11/11/2014 15:33    Microbiology: Recent Results (from the past 240 hour(s))  Culture, blood (routine x 2)     Status: None (Preliminary result)   Collection Time: 11/11/14  6:40 PM  Result Value Ref Range Status   Specimen Description BLOOD RIGHT HAND  Final   Special Requests BOTTLES DRAWN AEROBIC AND ANAEROBIC 6CC  Final   Culture NO GROWTH < 24 HOURS  Final   Report Status PENDING  Incomplete  Culture, blood (routine x 2)     Status: None (Preliminary result)   Collection Time: 11/11/14  6:57 PM  Result Value Ref Range Status   Specimen Description BLOOD LEFT HAND  Final   Special Requests BOTTLES DRAWN AEROBIC ONLY 6CC  Final   Culture NO GROWTH < 24 HOURS  Final   Report  Status PENDING  Incomplete     Labs: Basic Metabolic Panel:  Recent Labs Lab 11/11/14 1241 11/12/14 0541  NA 136 139  K 4.2 3.9  CL 102 104  CO2 28 25  GLUCOSE 99 87  BUN 10 9  CREATININE 0.70 0.54  CALCIUM 9.0 8.6*   CBC:  Recent Labs Lab 11/11/14 1241 11/12/14 0541  WBC 13.5* 11.4*  NEUTROABS 9.8*  --   HGB 12.2 11.1*  HCT 39.2 35.4*  MCV 86.3 86.3  PLT 429* 395   CBG:  Recent Labs Lab 11/12/14 0834  GLUCAP 120*    Signed:  Margarete Horace  [facility] 11/13/2014, 11:24 AM

## 2014-11-13 NOTE — Progress Notes (Signed)
ANTIBIOTIC CONSULT NOTE  Pharmacy Consult for Vancomycin / Aztreonam / Diflucan Indication: cellulitis  Allergies  Allergen Reactions  . Bee Venom Shortness Of Breath  . Nutritional Supplements Anaphylaxis  . Penicillins Anaphylaxis    REACTION: Angioedema  . Adhesive [Tape] Rash  . Latex Rash  . Vancomycin Other (See Comments)    Pt can tolerate Vancomycin but did cause Red-Man Syndrome.  Recommend to pre-medicate with Benadryl before doses administered.     Patient Measurements: Height: 5\' 3"  (160 cm) Weight: (!) 462 lb (209.562 kg) IBW/kg (Calculated) : 52.4  Vital Signs: Temp: 98 F (36.7 C) (08/07 0541) Temp Source: Oral (08/07 0541) BP: 129/63 mmHg (08/07 0541) Pulse Rate: 70 (08/07 0541) Intake/Output from previous day: 08/06 0701 - 08/07 0700 In: 4259 [P.O.:620; IV Piggyback:550] Out: -  Intake/Output from this shift:    Labs:  Recent Labs  11/11/14 1241 11/12/14 0541  WBC 13.5* 11.4*  HGB 12.2 11.1*  PLT 429* 395  CREATININE 0.70 0.54   Estimated Creatinine Clearance: 177 mL/min (by C-G formula based on Cr of 0.54).  Recent Labs  11/13/14 0504  [place] 35*     Microbiology: Recent Results (from the past 720 hour(s))  Culture, blood (routine x 2)     Status: None (Preliminary result)   Collection Time: 11/11/14  6:40 PM  Result Value Ref Range Status   Specimen Description BLOOD RIGHT HAND  Final   Special Requests BOTTLES DRAWN AEROBIC AND ANAEROBIC 6CC  Final   Culture NO GROWTH < 24 HOURS  Final   Report Status PENDING  Incomplete  Culture, blood (routine x 2)     Status: None (Preliminary result)   Collection Time: 11/11/14  6:57 PM  Result Value Ref Range Status   Specimen Description BLOOD LEFT HAND  Final   Special Requests BOTTLES DRAWN AEROBIC ONLY [place]  Final   Culture NO GROWTH < 24 HOURS  Final   Report Status PENDING  Incomplete    Medical History: Past Medical History  Diagnosis Date  . Amenorrhea   . Dysmenorrhea    . COPD (chronic obstructive pulmonary disease)   . Dysrhythmia     DR Johnsie Cancel    . Asthma   . Sleep apnea     CPAP  . Shortness of breath     WITH EXERTION   . GERD (gastroesophageal reflux disease)     HEARTBURN   TUMS  . Neuromuscular disorder     RESTLESS LEG   . Depression   . Anxiety   . Obesity   . Morbid obesity   . Ectopic pregnancy 2013  . Eosinophilic esophagitis     Diagnosed at [facility] 06/16/2013, untreated  . Depression   . Anxiety   . Schizoaffective disorder, bipolar type   . Diabetes mellitus without complication    Anti-infectives    Start     Dose/Rate Route Frequency Ordered Stop   11/12/14 1100  fluconazole (DIFLUCAN) tablet 400 mg     400 mg Oral Daily 11/12/14 1053     11/11/14 2200  vancomycin (VANCOCIN) 1,500 mg in sodium chloride 0.9 % 500 mL IVPB     1,500 mg 250 mL/hr over 120 Minutes Intravenous Every 8 hours 11/11/14 1805     11/11/14 2100  aztreonam (AZACTAM) 2 g in dextrose 5 % 50 mL IVPB     2 g 100 mL/hr over 30 Minutes Intravenous Every 8 hours 11/11/14 1831     11/11/14 2000  fluconazole (DIFLUCAN)  IVPB 400 mg  Status:  Discontinued     400 mg 100 mL/hr over 120 Minutes Intravenous Every 24 hours 11/11/14 1831 11/12/14 1053   11/11/14 1300  vancomycin (VANCOCIN) 2,000 mg in sodium chloride 0.9 % 500 mL IVPB     2,000 mg 250 mL/hr over 120 Minutes Intravenous  Once 11/11/14 1244 11/11/14 1549     Assessment: 39yo morbidly obese female presents to ED c/o worsening wound infection.  Pt has good renal fxn.  Asked to initiate Vancomycin.  Nurse reported Red-Man syndrome during Vancomycin infusion but no SOB, now being pre-treated with PO Benadryl.  Trough above goal.  Goal of Therapy:  Vancomycin trough level 10-15 mcg/ml  Plan:  Reduce Vancomycin to 1000mg  IV q 12 hours Repeat trough in 2-3 days. Benadryl 25mg  before each dose of Vancomycin Monitor labs, renal fxn, and c/s   Pricilla Larsson 11/13/2014,8:22 AM

## 2014-11-13 NOTE — Progress Notes (Addendum)
Lab called, Vancomycin trough 35, 0600 Vancomycin held per orders, attending Hospitalist notified, pharmacyinformed.

## 2014-11-13 NOTE — Progress Notes (Signed)
Patient with order to be discharge home. Discharge instructions given, patient verbalized understanding. Patient stable. Patient left in private vehicle with friends.

## 2014-11-13 NOTE — Consult Note (Signed)
WOC wound consult note Reason for Consult:Sub pannicular wound (full thickness) Wound type: Moisture associated skin damage (MASD), specifically intertriginous dermatitis (ITD)  plus friction. Patient states that initial presentation resembles  a "boil" or cyst"; infectious Pressure Ulcer POA: No Measurement:3cm x 3cm x 0.4cm Wound JEH:UDJSH, pink, granulating Drainage (amount, consistency, odor) serous, small amount.  Patient reports small amount of serosanguinous drainage this am after bathing. Periwound: Intact, clear. Faint residual erythema. Dressing procedure/placement/frequency: Recommend antimicrobial textile (InterDry Ag+) daily use following cleansing for absorption of moisture and moisture wicking, also friction reduction and provision of antimicrobial feature. Patient reports that this area is difficult for her to cleanse; a peri-bottle is provided for this purpose. Nursing staff to instruct on InterDry Ag+ use and provide additional pieces (2) for her to take home.  It is suggested by this Probation officer (following discussion with her MD this morning) that she is ready for discharge to the care of self with support from her fiance at home and with follow up by her PCP in the community. Patient is in agreement with the POC.  I have discussed with Bedside RN who will obtain the peri-bottle for cleansing and the InterDry Ag+instruction. [place] nursing team will not follow, but will remain available to this patient, the nursing and medical teams. Please re-consult if needed. Thanks, Maudie Flakes, MSN, RN, [place], [place], [place] [phone])

## 2014-11-13 NOTE — Progress Notes (Signed)
D/c instructions reviewed with patient.  Verbalized understanding.  Pt awaiting transportation to home at this time. [place], Eulis Canner 11/13/2014

## 2014-11-13 NOTE — Progress Notes (Signed)
Room 329 Heather [name] , Vancomycin Trough 35, 0600 Vancomycin held per orders to hold if trough > 20.

## 2014-11-15 DIAGNOSIS — M546 Pain in thoracic spine: Secondary | ICD-10-CM | POA: Diagnosis not present

## 2014-11-15 DIAGNOSIS — M9903 Segmental and somatic dysfunction of lumbar region: Secondary | ICD-10-CM | POA: Diagnosis not present

## 2014-11-15 DIAGNOSIS — M545 Low back pain: Secondary | ICD-10-CM | POA: Diagnosis not present

## 2014-11-15 DIAGNOSIS — M9902 Segmental and somatic dysfunction of thoracic region: Secondary | ICD-10-CM | POA: Diagnosis not present

## 2014-11-16 LAB — CULTURE, BLOOD (ROUTINE X 2)
Culture: NO GROWTH
Culture: NO GROWTH

## 2014-11-23 ENCOUNTER — Other Ambulatory Visit: Payer: Self-pay | Admitting: Certified Registered Nurse Anesthetist

## 2014-11-23 ENCOUNTER — Encounter (HOSPITAL_BASED_OUTPATIENT_CLINIC_OR_DEPARTMENT_OTHER): Payer: Medicare Other | Attending: Surgery

## 2014-11-23 DIAGNOSIS — B369 Superficial mycosis, unspecified: Secondary | ICD-10-CM | POA: Insufficient documentation

## 2014-11-23 DIAGNOSIS — S31103A Unspecified open wound of abdominal wall, right lower quadrant without penetration into peritoneal cavity, initial encounter: Secondary | ICD-10-CM | POA: Insufficient documentation

## 2014-11-23 DIAGNOSIS — L02211 Cutaneous abscess of abdominal wall: Secondary | ICD-10-CM | POA: Insufficient documentation

## 2014-11-23 DIAGNOSIS — X58XXXA Exposure to other specified factors, initial encounter: Secondary | ICD-10-CM | POA: Diagnosis not present

## 2014-11-23 DIAGNOSIS — L089 Local infection of the skin and subcutaneous tissue, unspecified: Secondary | ICD-10-CM | POA: Diagnosis not present

## 2014-11-23 DIAGNOSIS — J45909 Unspecified asthma, uncomplicated: Secondary | ICD-10-CM | POA: Insufficient documentation

## 2014-11-23 DIAGNOSIS — K219 Gastro-esophageal reflux disease without esophagitis: Secondary | ICD-10-CM | POA: Insufficient documentation

## 2014-11-23 DIAGNOSIS — F319 Bipolar disorder, unspecified: Secondary | ICD-10-CM | POA: Insufficient documentation

## 2014-11-23 DIAGNOSIS — G4733 Obstructive sleep apnea (adult) (pediatric): Secondary | ICD-10-CM | POA: Diagnosis not present

## 2014-11-28 DIAGNOSIS — L03311 Cellulitis of abdominal wall: Secondary | ICD-10-CM | POA: Diagnosis not present

## 2014-11-28 DIAGNOSIS — F064 Anxiety disorder due to known physiological condition: Secondary | ICD-10-CM | POA: Diagnosis not present

## 2014-11-28 DIAGNOSIS — R7301 Impaired fasting glucose: Secondary | ICD-10-CM | POA: Diagnosis not present

## 2014-11-30 DIAGNOSIS — K219 Gastro-esophageal reflux disease without esophagitis: Secondary | ICD-10-CM | POA: Diagnosis not present

## 2014-11-30 DIAGNOSIS — B369 Superficial mycosis, unspecified: Secondary | ICD-10-CM | POA: Diagnosis not present

## 2014-11-30 DIAGNOSIS — L02211 Cutaneous abscess of abdominal wall: Secondary | ICD-10-CM | POA: Diagnosis not present

## 2014-11-30 DIAGNOSIS — S31103A Unspecified open wound of abdominal wall, right lower quadrant without penetration into peritoneal cavity, initial encounter: Secondary | ICD-10-CM | POA: Diagnosis not present

## 2014-11-30 DIAGNOSIS — F319 Bipolar disorder, unspecified: Secondary | ICD-10-CM | POA: Diagnosis not present

## 2014-12-06 ENCOUNTER — Inpatient Hospital Stay (HOSPITAL_COMMUNITY)
Admission: EM | Admit: 2014-12-06 | Discharge: 2014-12-09 | DRG: 607 | Disposition: A | Discharge status: Home Health | Payer: Medicare Other | Attending: Family Medicine | Admitting: Family Medicine

## 2014-12-06 ENCOUNTER — Encounter (HOSPITAL_COMMUNITY): Payer: Self-pay | Admitting: Emergency Medicine

## 2014-12-06 DIAGNOSIS — F250 Schizoaffective disorder, bipolar type: Secondary | ICD-10-CM | POA: Diagnosis not present

## 2014-12-06 DIAGNOSIS — Z87891 Personal history of nicotine dependence: Secondary | ICD-10-CM

## 2014-12-06 DIAGNOSIS — I10 Essential (primary) hypertension: Secondary | ICD-10-CM | POA: Diagnosis not present

## 2014-12-06 DIAGNOSIS — Z800 Family history of malignant neoplasm of digestive organs: Secondary | ICD-10-CM | POA: Diagnosis not present

## 2014-12-06 DIAGNOSIS — Z833 Family history of diabetes mellitus: Secondary | ICD-10-CM | POA: Diagnosis not present

## 2014-12-06 DIAGNOSIS — Z8249 Family history of ischemic heart disease and other diseases of the circulatory system: Secondary | ICD-10-CM | POA: Diagnosis not present

## 2014-12-06 DIAGNOSIS — G2581 Restless legs syndrome: Secondary | ICD-10-CM | POA: Diagnosis present

## 2014-12-06 DIAGNOSIS — K219 Gastro-esophageal reflux disease without esophagitis: Secondary | ICD-10-CM | POA: Diagnosis present

## 2014-12-06 DIAGNOSIS — Z6841 Body Mass Index (BMI) 40.0 and over, adult: Secondary | ICD-10-CM

## 2014-12-06 DIAGNOSIS — F419 Anxiety disorder, unspecified: Secondary | ICD-10-CM | POA: Diagnosis present

## 2014-12-06 DIAGNOSIS — E119 Type 2 diabetes mellitus without complications: Secondary | ICD-10-CM

## 2014-12-06 DIAGNOSIS — G4733 Obstructive sleep apnea (adult) (pediatric): Secondary | ICD-10-CM | POA: Diagnosis present

## 2014-12-06 DIAGNOSIS — M793 Panniculitis, unspecified: Secondary | ICD-10-CM | POA: Diagnosis not present

## 2014-12-06 DIAGNOSIS — R1031 Right lower quadrant pain: Secondary | ICD-10-CM | POA: Diagnosis not present

## 2014-12-06 DIAGNOSIS — J449 Chronic obstructive pulmonary disease, unspecified: Secondary | ICD-10-CM | POA: Diagnosis present

## 2014-12-06 LAB — LACTIC ACID, PLASMA: LACTIC ACID, VENOUS: 1.5 mmol/L (ref 0.5–2.0)

## 2014-12-06 LAB — CBC WITH DIFFERENTIAL/PLATELET
Basophils Absolute: 0 10*3/uL (ref 0.0–0.1)
Basophils Relative: 0 % (ref 0–1)
Eosinophils Absolute: 0.4 10*3/uL (ref 0.0–0.7)
Eosinophils Relative: 3 % (ref 0–5)
HEMATOCRIT: 37.9 % (ref 36.0–46.0)
HEMOGLOBIN: 12 g/dL (ref 12.0–15.0)
LYMPHS ABS: 3.5 10*3/uL (ref 0.7–4.0)
LYMPHS PCT: 24 % (ref 12–46)
MCH: 27.3 pg (ref 26.0–34.0)
MCHC: 31.7 g/dL (ref 30.0–36.0)
MCV: 86.3 fL (ref 78.0–100.0)
MONOS PCT: 5 % (ref 3–12)
Monocytes Absolute: 0.8 10*3/uL (ref 0.1–1.0)
NEUTROS ABS: 9.8 10*3/uL — AB (ref 1.7–7.7)
NEUTROS PCT: 68 % (ref 43–77)
Platelets: 401 10*3/uL — ABNORMAL HIGH (ref 150–400)
RBC: 4.39 MIL/uL (ref 3.87–5.11)
RDW: 15.6 % — ABNORMAL HIGH (ref 11.5–15.5)
WBC: 14.4 10*3/uL — AB (ref 4.0–10.5)

## 2014-12-06 LAB — BASIC METABOLIC PANEL
ANION GAP: 9 (ref 5–15)
BUN: 8 mg/dL (ref 6–20)
CHLORIDE: 103 mmol/L (ref 101–111)
CO2: 25 mmol/L (ref 22–32)
Calcium: 9 mg/dL (ref 8.9–10.3)
Creatinine, Ser: 0.62 mg/dL (ref 0.44–1.00)
GFR calc non Af Amer: >60 mL/min (ref >60)
Glucose, Bld: 89 mg/dL (ref 65–99)
POTASSIUM: 3.4 mmol/L — AB (ref 3.5–5.1)
Sodium: 137 mmol/L (ref 135–145)

## 2014-12-06 LAB — URINALYSIS, ROUTINE W REFLEX MICROSCOPIC
Bilirubin Urine: NEGATIVE
GLUCOSE, UA: NEGATIVE mg/dL
HGB URINE DIPSTICK: NEGATIVE
Ketones, ur: NEGATIVE mg/dL
LEUKOCYTES UA: NEGATIVE
Nitrite: NEGATIVE
PROTEIN: NEGATIVE mg/dL
SPECIFIC GRAVITY, URINE: 1.015 (ref 1.005–1.030)
UROBILINOGEN UA: 0.2 mg/dL (ref 0.0–1.0)
pH: 6 (ref 5.0–8.0)

## 2014-12-06 MED ORDER — VANCOMYCIN HCL IN DEXTROSE 1-5 GM/200ML-% IV SOLN
1000.0000 mg | Freq: Once | INTRAVENOUS | Status: AC
  Administered 2014-12-06: 1000 mg via INTRAVENOUS
  Filled 2014-12-06: qty 200

## 2014-12-06 MED ORDER — VANCOMYCIN HCL IN DEXTROSE 1-5 GM/200ML-% IV SOLN
1000.0000 mg | Freq: Once | INTRAVENOUS | Status: AC
Start: 2014-12-06 — End: 2014-12-07
  Administered 2014-12-06: 1000 mg via INTRAVENOUS

## 2014-12-06 MED ORDER — VANCOMYCIN HCL IN DEXTROSE 1-5 GM/200ML-% IV SOLN: 1000.0000 mg | Freq: Once | INTRAVENOUS | Status: DC

## 2014-12-06 MED ORDER — SODIUM CHLORIDE 0.9 % IV BOLUS (SEPSIS)
1000.0000 mL | Freq: Once | INTRAVENOUS | Status: AC
  Administered 2014-12-06: 1000 mL via INTRAVENOUS

## 2014-12-06 MED ORDER — DIPHENHYDRAMINE HCL 25 MG PO CAPS
25.0000 mg | ORAL_CAPSULE | Freq: Once | ORAL | Status: AC
  Administered 2014-12-06: 25 mg via ORAL
  Filled 2014-12-06: qty 1

## 2014-12-06 NOTE — ED Notes (Signed)
MD at bedside. 

## 2014-12-06 NOTE — ED Notes (Signed)
Pain to right lower abdomen for last 2 days.  C/o burning and redness to site.  Rates pain 8/10.

## 2014-12-06 NOTE — Progress Notes (Addendum)
Pharmacy Note:  Initial antibiotics for Vancomycin ordered by EDP for cellulitis.  CrCl cannot be calculated (Patient has no serum creatinine result on file.).   Allergies  Allergen Reactions  . Bee Venom Shortness Of Breath  . Nutritional Supplements Anaphylaxis  . Penicillins Anaphylaxis    REACTION: Angioedema  . Adhesive [Tape] Rash  . Latex Rash  . Vancomycin Other (See Comments)    Pt can tolerate Vancomycin but did cause Red-Man Syndrome.  Recommend to pre-medicate with Benadryl before doses administered.      Filed Vitals:   12/06/14 1647  BP: 140/71  Pulse: 100  Temp: 97.7 F (36.5 C)  Resp: 20    Anti-infectives    Start     Dose/Rate Route Frequency Ordered Stop   12/06/14 2100  vancomycin (VANCOCIN) IVPB 1000 mg/200 mL premix     1,000 mg 200 mL/hr over 60 Minutes Intravenous  Once 12/06/14 1928     12/06/14 2030  vancomycin (VANCOCIN) IVPB 1000 mg/200 mL premix  Status:  Discontinued     1,000 mg 200 mL/hr over 60 Minutes Intravenous  Once 12/06/14 1928 12/06/14 1928   12/06/14 2000  vancomycin (VANCOCIN) IVPB 1000 mg/200 mL premix     1,000 mg 200 mL/hr over 60 Minutes Intravenous  Once 12/06/14 1928     12/06/14 1930  vancomycin (VANCOCIN) IVPB 1000 mg/200 mL premix  Status:  Discontinued     1,000 mg 200 mL/hr over 60 Minutes Intravenous  Once 12/06/14 1928 12/06/14 1928      Plan: Well known from previous admission.  Tolerates Vancomycin when premedicated with PO diphenhydramine. Initial doses of Vancomycin 2gm  X 1 ordered. F/U admission orders for further dosing if therapy continued.  Pricilla Larsson, [facility] 12/06/2014 7:28 PM  Addendum: Admission planned, H&P and orders pending. Current loading dose will cover patient until next dose to in AM. F/U in AM for finalized Vancomycin plan orders.  Pricilla Larsson, [facility] 12/06/2014 10:30 PM

## 2014-12-06 NOTE — ED Provider Notes (Signed)
CSN: [id number]     Arrival date & time 12/06/14  1632 History   First MD Initiated Contact with Patient 12/06/14 1824     Chief Complaint  Patient presents with  . Abdominal Pain     (Consider location/radiation/quality/duration/timing/severity/associated sxs/prior Treatment) HPI   Heather [name] is a 39 y.o. female who presents for evaluation of recurrent infection. He feels like her abdominal wall cellulitis has returned since being discharged from the hospital about 3 weeks ago. Since that time she has followed-up with her PCP, and the wound care doctors. She has chills, nausea and abdominal wall pain. She denies dysuria or diarrhea. She is not taking her medications. She had some drainage from the wound of her right lower abdominal wall. She denies headache, dizziness, or paresthesia. There are no other known modifying factors.   Past Medical History  Diagnosis Date  . Amenorrhea   . Dysmenorrhea   . COPD (chronic obstructive pulmonary disease)   . Dysrhythmia     DR Johnsie Cancel    . Asthma   . Sleep apnea     CPAP  . Shortness of breath     WITH EXERTION   . GERD (gastroesophageal reflux disease)     HEARTBURN   TUMS  . Neuromuscular disorder     RESTLESS LEG   . Depression   . Anxiety   . Obesity   . Morbid obesity   . Ectopic pregnancy 2013  . Eosinophilic esophagitis     Diagnosed at [facility] 06/16/2013, untreated  . Depression   . Anxiety   . Schizoaffective disorder, bipolar type   . Diabetes mellitus without complication    Past Surgical History  Procedure Laterality Date  . Cholecystectomy    . Tonsillectomy    . Tooth extraction  10/28/2011    Procedure: DENTAL RESTORATION/EXTRACTIONS;  Surgeon: Gae Bon, DDS;  Location: [place];  Service: Oral Surgery;;  . Dental surgery    . Esophagogastroduodenoscopy  May 2007    Dr. Gala Romney: Normal esophagus, stomach, D1, D2  . Esophagogastroduodenoscopy  06/16/2013    Dr. Carlton Adam, eosinophilic esophagitis, reactive  gastropathy, no esophageal dilation   Family History  Problem Relation Age of Onset  . Depression Mother   . Hypertension Sister   . Diabetes Paternal Uncle   . Anxiety disorder Paternal Uncle   . Colon cancer Maternal Grandmother     50s  . Liver disease Neg Hx    Social History  Substance Use Topics  . Smoking status: Former Smoker -- 0.50 packs/day for 8 years    Types: Cigarettes    Quit date: 04/25/2011  . Smokeless tobacco: Never Used  . Alcohol Use: No     Comment: occasional    OB History    Gravida Para Term Preterm AB TAB SAB Ectopic Multiple Living   1    1  1         Review of Systems  All other systems reviewed and are negative.     Allergies  Bee venom; Nutritional supplements; Penicillins; Adhesive; Latex; and Vancomycin  Home Medications   Prior to Admission medications   Medication Sig Start Date End Date Taking? Authorizing Provider  acetaminophen (TYLENOL) 500 MG tablet Take 1,000 mg by mouth every 6 (six) hours as needed. pain    Historical Provider, MD  albuterol (PROVENTIL HFA;VENTOLIN HFA) 108 (90 BASE) MCG/ACT inhaler Inhale 1-2 puffs into the lungs every 6 (six) hours as needed for wheezing. 11/15/12   Hope  Bunnie Pion, NP  buPROPion (WELLBUTRIN XL) 300 MG 24 hr tablet Take 300 mg by mouth daily. 04/28/14   Historical Provider, MD  busPIRone (BUSPAR) 10 MG tablet Take 15 mg by mouth 2 (two) times daily. For anxiety    Historical Provider, MD  dicyclomine (BENTYL) 20 MG tablet Take 1 tablet (20 mg total) by mouth 3 (three) times daily before meals. As needed for abdominal cramps or diarrhea 08/25/14   Mahala Menghini, PA-C  fluticasone [facility] 50 MCG/ACT nasal spray Place 1-2 sprays into both nostrils daily. 10/11/14   Historical Provider, MD  furosemide (LASIX) 40 MG tablet Take 40 mg by mouth daily.  06/10/14   Historical Provider, MD  medroxyPROGESTERone (PROVERA) 10 MG tablet 2 tablets daily. For five days each month 07/05/14   Historical Provider, MD   metFORMIN (GLUCOPHAGE) 500 MG tablet Take 500 mg by mouth daily with breakfast.    Historical Provider, MD  mometasone (ASMANEX) 220 MCG/INH inhaler Inhale 2 puffs into the lungs daily.    Historical Provider, MD  montelukast (SINGULAIR) 10 MG tablet Take 10 mg by mouth at bedtime.    Historical Provider, MD  pantoprazole (PROTONIX) 40 MG tablet TAKE ONE TABLET BY MOUTH DAILY 11/03/14   Mahala Menghini, PA-C  potassium chloride (MICRO-K) 10 MEQ CR capsule Take 10 mEq by mouth 2 (two) times daily.    Historical Provider, MD  Prenatal Vit-Fe Fumarate-FA (PRENATAL MULTIVITAMIN) TABS Take 1 tablet by mouth daily.     Historical Provider, MD  rOPINIRole (REQUIP) 0.5 MG tablet Take 1 mg by mouth at bedtime.  07/04/14   Historical Provider, MD  sulfamethoxazole-trimethoprim (BACTRIM DS,SEPTRA DS) 800-160 MG per tablet Take 1 tablet by mouth 2 (two) times daily. 11/13/14   Orvan Falconer, MD  ziprasidone (GEODON) 60 MG capsule Take 60 mg by mouth every evening.     Historical Provider, MD   BP 138/67 mmHg  Pulse 92  Temp(Src) 97.7 F (36.5 C) (Oral)  Resp 18  Ht 5\' 3"  (1.6 m)  Wt 447 lb (202.758 kg)  BMI 79.20 kg/m2  SpO2 98% Physical Exam  Constitutional: She is oriented to person, place, and time. She appears well-developed.  Morbidly obese  HENT:  Head: Normocephalic and atraumatic.  Right Ear: External ear normal.  Left Ear: External ear normal.  Eyes: Conjunctivae and EOM are normal. Pupils are equal, round, and reactive to light.  Neck: Normal range of motion and phonation normal. Neck supple.  Cardiovascular: Normal rate, regular rhythm and normal heart sounds.   Capillary refill 3 seconds hands bilaterally  Pulmonary/Chest: Effort normal and breath sounds normal. She exhibits no bony tenderness.  Abdominal: Soft. There is no tenderness. There is no guarding.  Very large abdominal panniculus. Right sided paniculus, beneath the skin fold has marked erythema, which extends across the inguinal  crease, and a draining wound of the abdominal panniculus. There are no areas of fluctuance.  Musculoskeletal: Normal range of motion.  Neurological: She is alert and oriented to person, place, and time. No cranial nerve deficit or sensory deficit. She exhibits normal muscle tone. Coordination normal.  Skin: Skin is warm, dry and intact.  Psychiatric: She has a normal mood and affect. Her behavior is normal. Judgment and thought content normal.  Nursing note and vitals reviewed.   ED Course  Procedures (including critical care time) Medications  vancomycin (VANCOCIN) IVPB 1000 mg/200 mL premix (1,000 mg Intravenous New Bag/Given 12/06/14 2104)    Followed by  vancomycin (  VANCOCIN) IVPB 1000 mg/200 mL premix (not administered)  sodium chloride 0.9 % bolus 1,000 mL (1,000 mLs Intravenous New Bag/Given 12/06/14 2103)  diphenhydrAMINE (BENADRYL) capsule 25 mg (25 mg Oral Given 12/06/14 2103)    Patient Vitals for the past 24 hrs:  BP Temp Temp src Pulse Resp SpO2 Height Weight  12/06/14 2054 138/67 mmHg - - 92 18 98 % - -  12/06/14 1647 140/71 mmHg 97.7 F (36.5 C) Oral 100 20 100 % 5\' 3"  (1.6 m) (!) 447 lb (202.758 kg)    10:02 PM Reevaluation with update and discussion. After initial assessment and treatment, an updated evaluation reveals patient is concerned that she cannot go home because she is doing everything she can, but the condition worsened again. Findings discussed with the patient, family members, all questions were answered. Trinity Haun L   10:05 PM-Consult complete with Dr. Darrick Meigs. Patient case explained and discussed. He agrees to admit patient for further evaluation and treatment. Call ended at [place] - Abnormal; Notable for the following:    Potassium 3.4 (*)    All other components within normal limits  CBC WITH DIFFERENTIAL/PLATELET - Abnormal; Notable for the following:    WBC 14.4 (*)    RDW 15.6 (*)    Platelets 401  (*)    Neutro Abs 9.8 (*)    All other components within normal limits  URINE CULTURE  URINALYSIS, ROUTINE W REFLEX MICROSCOPIC (NOT AT [facility])  LACTIC ACID, PLASMA  LACTIC ACID, PLASMA  I-STAT CG4 LACTIC ACID, ED    Imaging Review No results found. I have personally reviewed and evaluated these images and lab results as part of my medical decision-making.   EKG Interpretation None      MDM   Final diagnoses:  Panniculitis    Recurrent panniculitis, with open draining wound. No evidence for severe sepsis, metabolic instability or suggestion for impending vascular collapse. Patient has difficulty with wound care due to her significant obesity, and large panniculus.  Nursing Notes Reviewed/ Care Coordinated, and agree without changes. Applicable Imaging Reviewed.  Interpretation of Laboratory Data incorporated into ED treatment    Daleen Bo, MD 12/06/14 2226

## 2014-12-07 DIAGNOSIS — E119 Type 2 diabetes mellitus without complications: Secondary | ICD-10-CM

## 2014-12-07 DIAGNOSIS — M793 Panniculitis, unspecified: Principal | ICD-10-CM

## 2014-12-07 DIAGNOSIS — I10 Essential (primary) hypertension: Secondary | ICD-10-CM

## 2014-12-07 DIAGNOSIS — F250 Schizoaffective disorder, bipolar type: Secondary | ICD-10-CM

## 2014-12-07 LAB — CBC
HCT: 37.9 % (ref 36.0–46.0)
Hemoglobin: 11.6 g/dL — ABNORMAL LOW (ref 12.0–15.0)
MCH: 26.5 pg (ref 26.0–34.0)
MCHC: 30.6 g/dL (ref 30.0–36.0)
MCV: 86.7 fL (ref 78.0–100.0)
PLATELETS: 348 10*3/uL (ref 150–400)
RBC: 4.37 MIL/uL (ref 3.87–5.11)
RDW: 15.7 % — AB (ref 11.5–15.5)
WBC: 11.3 10*3/uL — ABNORMAL HIGH (ref 4.0–10.5)

## 2014-12-07 LAB — COMPREHENSIVE METABOLIC PANEL
ALBUMIN: 3.4 g/dL — AB (ref 3.5–5.0)
ALT: 17 U/L (ref 14–54)
ANION GAP: 8 (ref 5–15)
AST: 21 U/L (ref 15–41)
Alkaline Phosphatase: 46 U/L (ref 38–126)
BILIRUBIN TOTAL: 0.4 mg/dL (ref 0.3–1.2)
BUN: 6 mg/dL (ref 6–20)
CO2: 23 mmol/L (ref 22–32)
Calcium: 8.7 mg/dL — ABNORMAL LOW (ref 8.9–10.3)
Chloride: 106 mmol/L (ref 101–111)
Creatinine, Ser: 0.58 mg/dL (ref 0.44–1.00)
GFR calc Af Amer: >60 mL/min (ref >60)
GFR calc non Af Amer: >60 mL/min (ref >60)
GLUCOSE: 88 mg/dL (ref 65–99)
POTASSIUM: 3.6 mmol/L (ref 3.5–5.1)
SODIUM: 137 mmol/L (ref 135–145)
TOTAL PROTEIN: 6.9 g/dL (ref 6.5–8.1)

## 2014-12-07 LAB — GLUCOSE, CAPILLARY
GLUCOSE-CAPILLARY: 97 mg/dL (ref 65–99)
Glucose-Capillary: 121 mg/dL — ABNORMAL HIGH (ref 65–99)
Glucose-Capillary: 81 mg/dL (ref 65–99)
Glucose-Capillary: 86 mg/dL (ref 65–99)

## 2014-12-07 MED ORDER — BUDESONIDE 0.5 MG/2ML IN SUSP
0.5000 mg | Freq: Two times a day (BID) | RESPIRATORY_TRACT | Status: DC
  Administered 2014-12-07 – 2014-12-09 (×5): 0.5 mg via RESPIRATORY_TRACT
  Filled 2014-12-07 (×11): qty 2

## 2014-12-07 MED ORDER — ONDANSETRON HCL 4 MG PO TABS
4.0000 mg | ORAL_TABLET | Freq: Four times a day (QID) | ORAL | Status: DC | PRN
  Administered 2014-12-08: 4 mg via ORAL
  Filled 2014-12-07: qty 1

## 2014-12-07 MED ORDER — DIPHENHYDRAMINE HCL 25 MG PO CAPS
25.0000 mg | ORAL_CAPSULE | Freq: Two times a day (BID) | ORAL | Status: DC
  Administered 2014-12-07: 25 mg via ORAL
  Filled 2014-12-07: qty 1

## 2014-12-07 MED ORDER — ONDANSETRON HCL 4 MG/2ML IJ SOLN
4.0000 mg | Freq: Four times a day (QID) | INTRAMUSCULAR | Status: DC | PRN
  Filled 2014-12-07: qty 2

## 2014-12-07 MED ORDER — DIPHENHYDRAMINE HCL 25 MG PO CAPS
25.0000 mg | ORAL_CAPSULE | Freq: Three times a day (TID) | ORAL | Status: DC | PRN
  Administered 2014-12-07 – 2014-12-09 (×4): 25 mg via ORAL
  Filled 2014-12-07 (×4): qty 1

## 2014-12-07 MED ORDER — ENOXAPARIN SODIUM 100 MG/ML ~~LOC~~ SOLN
0.5000 mg/kg | SUBCUTANEOUS | Status: DC
  Administered 2014-12-08: 100 mg via SUBCUTANEOUS
  Filled 2014-12-07 (×2): qty 1

## 2014-12-07 MED ORDER — SODIUM CHLORIDE 0.9 % IV SOLN
INTRAVENOUS | Status: DC
  Administered 2014-12-07: 02:00:00 via INTRAVENOUS

## 2014-12-07 MED ORDER — BUPROPION HCL ER (XL) 300 MG PO TB24
300.0000 mg | ORAL_TABLET | Freq: Every day | ORAL | Status: DC
  Administered 2014-12-07 – 2014-12-09 (×3): 300 mg via ORAL
  Filled 2014-12-07 (×6): qty 1

## 2014-12-07 MED ORDER — POTASSIUM CHLORIDE CRYS ER 20 MEQ PO TBCR
40.0000 meq | EXTENDED_RELEASE_TABLET | Freq: Once | ORAL | Status: AC
  Administered 2014-12-07: 40 meq via ORAL
  Filled 2014-12-07: qty 2

## 2014-12-07 MED ORDER — HYDROCODONE-ACETAMINOPHEN 5-325 MG PO TABS
1.0000 | ORAL_TABLET | ORAL | Status: DC | PRN
  Administered 2014-12-07: 1 via ORAL
  Filled 2014-12-07: qty 1

## 2014-12-07 MED ORDER — INSULIN ASPART 100 UNIT/ML ~~LOC~~ SOLN
0.0000 [IU] | Freq: Three times a day (TID) | SUBCUTANEOUS | Status: DC
  Administered 2014-12-07: 1 [IU] via SUBCUTANEOUS

## 2014-12-07 MED ORDER — BUSPIRONE HCL 5 MG PO TABS
15.0000 mg | ORAL_TABLET | Freq: Two times a day (BID) | ORAL | Status: DC
  Administered 2014-12-07 – 2014-12-09 (×6): 15 mg via ORAL
  Filled 2014-12-07 (×6): qty 3

## 2014-12-07 MED ORDER — MONTELUKAST SODIUM 10 MG PO TABS
10.0000 mg | ORAL_TABLET | Freq: Every day | ORAL | Status: DC
  Administered 2014-12-07 – 2014-12-08 (×3): 10 mg via ORAL
  Filled 2014-12-07 (×3): qty 1

## 2014-12-07 MED ORDER — NYSTATIN 100000 UNIT/GM EX POWD
Freq: Two times a day (BID) | CUTANEOUS | Status: DC
  Administered 2014-12-07 – 2014-12-09 (×5): via TOPICAL
  Filled 2014-12-07: qty 15

## 2014-12-07 MED ORDER — VANCOMYCIN HCL 10 G IV SOLR
1500.0000 mg | Freq: Two times a day (BID) | INTRAVENOUS | Status: DC
  Administered 2014-12-07 – 2014-12-09 (×4): 1500 mg via INTRAVENOUS
  Filled 2014-12-07 (×11): qty 1500

## 2014-12-07 MED ORDER — ALBUTEROL SULFATE (2.5 MG/3ML) 0.083% IN NEBU: 3.0000 mL | INHALATION_SOLUTION | Freq: Four times a day (QID) | RESPIRATORY_TRACT | Status: DC | PRN

## 2014-12-07 MED ORDER — PANTOPRAZOLE SODIUM 40 MG PO TBEC
40.0000 mg | DELAYED_RELEASE_TABLET | Freq: Every day | ORAL | Status: DC
  Administered 2014-12-07 – 2014-12-09 (×3): 40 mg via ORAL
  Filled 2014-12-07 (×3): qty 1

## 2014-12-07 MED ORDER — ZIPRASIDONE HCL 60 MG PO CAPS
60.0000 mg | ORAL_CAPSULE | Freq: Every evening | ORAL | Status: DC
  Administered 2014-12-07 – 2014-12-08 (×2): 60 mg via ORAL
  Filled 2014-12-07 (×5): qty 1

## 2014-12-07 MED ORDER — ROPINIROLE HCL 1 MG PO TABS
1.0000 mg | ORAL_TABLET | Freq: Every day | ORAL | Status: DC
  Administered 2014-12-07 – 2014-12-08 (×3): 1 mg via ORAL
  Filled 2014-12-07 (×3): qty 1

## 2014-12-07 NOTE — Consult Note (Addendum)
WOC wound consult note Reason for Consult: Consult requested for pannus; pt admitted with cellulitis.  She is familiar to the [facility] team from a recent admission; refer to progress notes on 8/7. Consult performed with assistance from the bedside nurse for measurements and descriptions, and discussed plan of care via telephone. Wound type: Moisture associated skin damage in the pannus, and also a full thickness wound. Measurement: Full thickness wound to inner pannus fold; 3cm x 3cm x 0.4cm, red and moist with mod amt yellow-green tinged drainage, no odor. Wound bed: Wound appearance basically unchanged from previous consult, but pt has generalized edema and erythemia to skin surrounding from new diagnosis of cellulitis. Periwound:  Skin fold of pannus with red macerated moist skin; appearance consistent with intertrigo. Dressing procedure/placement/frequency: Recommend antimicrobial textile (InterDry Ag+) daily use following cleansing for absorption of moisture and moisture wicking, also friction reduction and provision of antimicrobial feature.  Interdry silver-impregnated fabric ordered for use by bedside nurses and instructions provided.  This product should remain in place for 5 days for optimal plan of care to provide antimicrobial benefits and wick moisture away from skin. Aquacel to provide antimicrobial benefits and absorb drainage to abd wound, covered with foam dressing to promote healing. Please re-consult if further assistance is needed.  Thank-you,  Julien Girt MSN, [place], [place], [place], [place]

## 2014-12-07 NOTE — Care Management Note (Signed)
Case Management Note  Patient Details  Name: Heather [name] MRN: [id number] Date of Birth: 1975-06-11  Subjective/Objective:                  Pt admitted from home with panniculitis. Pt lives with family and will return home at discharge. Pt is fairly independent with ADL's.  Action/Plan: Pt will benefit from [facility] RN at discharge. Will continue to follow for discharge planning needs.  Expected Discharge Date:                  Expected Discharge Plan:  [place]  In-House Referral:  NA  Discharge planning Services  CM Consult  Post Acute Care Choice:  Home Health Choice offered to:  Patient  DME Arranged:    DME Agency:     HH Arranged:  RN [place] Agency:     Status of Service:  In process, will continue to follow  Medicare Important Message Given:    Date Medicare IM Given:    Medicare IM give by:    Date Additional Medicare IM Given:    Additional Medicare Important Message give by:     If discussed at [place] of Stay Meetings, dates discussed:    Additional Comments:  Joylene Draft, RN 12/07/2014, 12:04 PM

## 2014-12-07 NOTE — H&P (Signed)
PCP:   [name], MD   Chief Complaint:  Redness and pain in skin of abdomen  HPI:  39 year old female who  has a past medical history of Amenorrhea; Dysmenorrhea; COPD (chronic obstructive pulmonary disease); Dysrhythmia; Asthma; Sleep apnea; Shortness of breath; GERD (gastroesophageal reflux disease); Neuromuscular disorder; Depression; Anxiety; Obesity; Morbid obesity; Ectopic pregnancy (2013); Eosinophilic esophagitis; Depression; Anxiety; Schizoaffective disorder, bipolar type; and Diabetes mellitus without complication. Today presents to the hospital after patient will read and and pain in the abdominal wall skin. Patient was discharged from the hospital 3 weeks ago at that time she was prescribed 5 days of antibiotic. She completed the antibiotic on August 12. Patient says that she developed diarrhea which has now improved. She denies chest pain, no shortness of breath. No fever. She noted that the abdominal wall skin had become red and the wound on the right side was oozing. In the ED patient started on vancomycin for panniculitis.  Allergies:   Allergies  Allergen Reactions  . Bee Venom Shortness Of Breath  . Nutritional Supplements Anaphylaxis  . Penicillins Anaphylaxis    REACTION: Angioedema  . Adhesive [Tape] Rash  . Latex Rash  . Vancomycin Other (See Comments)    Pt can tolerate Vancomycin but did cause Red-Man Syndrome.  Recommend to pre-medicate with Benadryl before doses administered.        Past Medical History  Diagnosis Date  . Amenorrhea   . Dysmenorrhea   . COPD (chronic obstructive pulmonary disease)   . Dysrhythmia     DR Johnsie Cancel    . Asthma   . Sleep apnea     CPAP  . Shortness of breath     WITH EXERTION   . GERD (gastroesophageal reflux disease)     HEARTBURN   TUMS  . Neuromuscular disorder     RESTLESS LEG   . Depression   . Anxiety   . Obesity   . Morbid obesity   . Ectopic pregnancy 2013  . Eosinophilic esophagitis     Diagnosed  at [facility] 06/16/2013, untreated  . Depression   . Anxiety   . Schizoaffective disorder, bipolar type   . Diabetes mellitus without complication     Past Surgical History  Procedure Laterality Date  . Cholecystectomy    . Tonsillectomy    . Tooth extraction  10/28/2011    Procedure: DENTAL RESTORATION/EXTRACTIONS;  Surgeon: Gae Bon, DDS;  Location: [place];  Service: Oral Surgery;;  . Dental surgery    . Esophagogastroduodenoscopy  May 2007    Dr. Gala Romney: Normal esophagus, stomach, D1, D2  . Esophagogastroduodenoscopy  06/16/2013    Dr. Carlton Adam, eosinophilic esophagitis, reactive gastropathy, no esophageal dilation    Prior to Admission medications   Medication Sig Start Date End Date Taking? Authorizing Provider  acetaminophen (TYLENOL) 500 MG tablet Take 1,000 mg by mouth every 6 (six) hours as needed. pain   Yes Historical Provider, MD  albuterol (PROVENTIL HFA;VENTOLIN HFA) 108 (90 BASE) MCG/ACT inhaler Inhale 1-2 puffs into the lungs every 6 (six) hours as needed for wheezing. 11/15/12  Yes Hope Bunnie Pion, NP  buPROPion (WELLBUTRIN XL) 300 MG 24 hr tablet Take 300 mg by mouth daily. 04/28/14  Yes Historical Provider, MD  busPIRone (BUSPAR) 10 MG tablet Take 15 mg by mouth 2 (two) times daily. For anxiety   Yes Historical Provider, MD  dicyclomine (BENTYL) 20 MG tablet Take 1 tablet (20 mg total) by mouth 3 (three) times daily before meals. As  needed for abdominal cramps or diarrhea 08/25/14  Yes Mahala Menghini, PA-C  fluticasone [facility]) 50 MCG/ACT nasal spray Place 1-2 sprays into both nostrils daily. 10/11/14  Yes Historical Provider, MD  furosemide (LASIX) 40 MG tablet Take 40 mg by mouth daily.  06/10/14  Yes Historical Provider, MD  metFORMIN (GLUCOPHAGE) 500 MG tablet Take 500 mg by mouth daily with breakfast.   Yes Historical Provider, MD  mometasone (ASMANEX) 220 MCG/INH inhaler Inhale 2 puffs into the lungs 2 (two) times daily.    Yes Historical Provider, MD  montelukast  (SINGULAIR) 10 MG tablet Take 10 mg by mouth at bedtime.   Yes Historical Provider, MD  multivitamin (VIT W/EXTRA C) CHEW chewable tablet Chew 1 tablet by mouth daily.   Yes Historical Provider, MD  pantoprazole (PROTONIX) 40 MG tablet TAKE ONE TABLET BY MOUTH DAILY 11/03/14  Yes Mahala Menghini, PA-C  potassium chloride (MICRO-K) 10 MEQ CR capsule Take 10 mEq by mouth 2 (two) times daily.   Yes Historical Provider, MD  Prenatal Vit-Fe Fumarate-FA (PRENATAL MULTIVITAMIN) TABS Take 1 tablet by mouth daily.    Yes Historical Provider, MD  rOPINIRole (REQUIP) 0.5 MG tablet Take 1 mg by mouth at bedtime.  07/04/14  Yes Historical Provider, MD  vitamin E 400 UNIT capsule Take 400 Units by mouth daily.   Yes Historical Provider, MD  ziprasidone (GEODON) 60 MG capsule Take 60 mg by mouth every evening.    Yes Historical Provider, MD  medroxyPROGESTERone (PROVERA) 10 MG tablet Take 2 tablets by mouth daily. For five days each month 07/05/14   Historical Provider, MD  sulfamethoxazole-trimethoprim (BACTRIM DS,SEPTRA DS) 800-160 MG per tablet Take 1 tablet by mouth 2 (two) times daily. Patient not taking: Reported on 12/06/2014 11/13/14   Orvan Falconer, MD    Social History:  reports that she quit smoking about 3 years ago. Her smoking use included Cigarettes. She has a 4 pack-year smoking history. She has never used smokeless tobacco. She reports that she does not drink alcohol or use illicit drugs.  Family History  Problem Relation Age of Onset  . Depression Mother   . Hypertension Sister   . Diabetes Paternal Uncle   . Anxiety disorder Paternal Uncle   . Colon cancer Maternal Grandmother     31s  . Liver disease Neg Hx     Filed Weights   12/06/14 1647  Weight: 202.758 kg (447 lb)    All the positives are listed in BOLD  Review of Systems:  HEENT: Headache, blurred vision, runny nose, sore throat Neck: Hypothyroidism, hyperthyroidism,,lymphadenopathy Chest : Shortness of breath, history of COPD,  Asthma Heart : Chest pain, history of coronary arterey disease GI:  Nausea, vomiting, diarrhea, constipation, GERD GU: Dysuria, urgency, frequency of urination, hematuria Neuro: Stroke, seizures, syncope Psych: Depression, anxiety, hallucinations   Physical Exam: Blood pressure 130/59, pulse 92, temperature 98.3 F (36.8 C), temperature source Oral, resp. rate 22, height 5\' 3"  (1.6 m), weight 202.758 kg (447 lb), SpO2 98 %. Constitutional:   Patient is a well-developed and well-nourished female in no acute distress and cooperative with exam. Head: Normocephalic and atraumatic Mouth: Mucus membranes moist Eyes: PERRL, EOMI, conjunctivae normal Neck: Supple, No Thyromegaly Cardiovascular: RRR, S1 normal, S2 normal Pulmonary/Chest: CTAB, no wheezes, rales, or rhonchi Abdominal: Soft. Non-tender, non-distended, bowel sounds are normal, no masses, organomegaly, or guarding present. Abdominal panniculus, right side [name] Kulas erythematous, extending across the inguinal crease and has a draining wound.  Neurological: A&O  x3, Strength is normal and symmetric bilaterally, cranial nerve II-XII are grossly intact, no focal motor deficit, sensory intact to light touch bilaterally.  Extremities : No Cyanosis, Clubbing or Edema  Labs on Admission:  Basic Metabolic Panel:  Recent Labs Lab 12/06/14 2012  NA 137  K 3.4*  CL 103  CO2 25  GLUCOSE 89  BUN 8  CREATININE 0.62  CALCIUM 9.0   CBC:  Recent Labs Lab 12/06/14 2012  WBC 14.4*  NEUTROABS 9.8*  HGB 12.0  HCT 37.9  MCV 86.3  PLT 401*      Assessment/Plan Active Problems:   Morbid obesity   Essential hypertension   Schizoaffective disorder, bipolar type   Panniculitis  Panniculitis We'll admit the patient, start vancomycin per pharmacy  Once improvement can be switched over to oral antibiotic  Diabetes mellitus Hold oral hypoglycemic agents, start sliding scale insulin with NovoLog  DVT  prophylaxis Lovenox   Code status: Full code  Family discussion: No family at bedside   Time Spent on Admission: 60 min  [facility] Pager: [phone] 12/07/2014, 12:39 AM  If 7PM-7AM, please contact night-coverage  [email or web address]  Password [email or web address]

## 2014-12-07 NOTE — Progress Notes (Signed)
ANTIBIOTIC CONSULT NOTE - FOLLOW UP  Pharmacy Consult for Vancomycin Indication: panniculitis / cellulitis  Allergies  Allergen Reactions  . Bee Venom Shortness Of Breath  . Nutritional Supplements Anaphylaxis  . Penicillins Anaphylaxis    REACTION: Angioedema  . Adhesive [Tape] Rash  . Latex Rash  . Vancomycin Other (See Comments)    Pt can tolerate Vancomycin but did cause Red-Man Syndrome.  Recommend to pre-medicate with Benadryl before doses administered.     Patient Measurements: Height: 5\' 3"  (160 cm) Weight: (!) 452 lb 4.8 oz (205.162 kg) IBW/kg (Calculated) : 52.4  Vital Signs: Temp: 98.7 F (37.1 C) (08/31 0522) Temp Source: Oral (08/31 0522) BP: 153/78 mmHg (08/31 0522) Pulse Rate: 91 (08/31 0522) Intake/Output from previous day:   Intake/Output from this shift:    Labs:  Recent Labs  12/06/14 2012 12/07/14 0448 12/07/14 0454  WBC 14.4*  --  11.3*  HGB 12.0  --  11.6*  PLT 401*  --  348  CREATININE 0.62 0.58  --    Estimated Creatinine Clearance: 174.2 mL/min (by C-G formula based on Cr of 0.58). No results for input(s): VANCOTROUGH, VANCOPEAK, VANCORANDOM, GENTTROUGH, GENTPEAK, GENTRANDOM, TOBRATROUGH, TOBRAPEAK, TOBRARND, AMIKACINPEAK, AMIKACINTROU, AMIKACIN in the last 72 hours.   Microbiology: Recent Results (from the past 720 hour(s))  Culture, blood (routine x 2)     Status: None   Collection Time: 11/11/14  6:40 PM  Result Value Ref Range Status   Specimen Description BLOOD RIGHT HAND  Final   Special Requests BOTTLES DRAWN AEROBIC AND ANAEROBIC 6CC  Final   Culture NO GROWTH 5 DAYS  Final   Report Status 11/16/2014 FINAL  Final  Culture, blood (routine x 2)     Status: None   Collection Time: 11/11/14  6:57 PM  Result Value Ref Range Status   Specimen Description BLOOD LEFT HAND  Final   Special Requests BOTTLES DRAWN AEROBIC ONLY 6CC  Final   Culture NO GROWTH 5 DAYS  Final   Report Status 11/16/2014 FINAL  Final    Anti-infectives     Start     Dose/Rate Route Frequency Ordered Stop   12/07/14 1000  vancomycin (VANCOCIN) 1,500 mg in sodium chloride 0.9 % 500 mL IVPB     1,500 mg 250 mL/hr over 120 Minutes Intravenous Every 12 hours 12/07/14 0748     12/06/14 2100  vancomycin (VANCOCIN) IVPB 1000 mg/200 mL premix     1,000 mg 200 mL/hr over 60 Minutes Intravenous  Once 12/06/14 1928 12/07/14 0000   12/06/14 2030  vancomycin (VANCOCIN) IVPB 1000 mg/200 mL premix  Status:  Discontinued     1,000 mg 200 mL/hr over 60 Minutes Intravenous  Once 12/06/14 1928 12/06/14 1928   12/06/14 2000  vancomycin (VANCOCIN) IVPB 1000 mg/200 mL premix     1,000 mg 200 mL/hr over 60 Minutes Intravenous  Once 12/06/14 1928 12/06/14 2236   12/06/14 1930  vancomycin (VANCOCIN) IVPB 1000 mg/200 mL premix  Status:  Discontinued     1,000 mg 200 mL/hr over 60 Minutes Intravenous  Once 12/06/14 1928 12/06/14 1928     Assessment: 38yo morbidly obese female with good renal fxn.  Pt admitted with panniculitis / cellulitis.  Well known from previous admission. Tolerates Vancomycin when premedicated with PO diphenhydramine.  Pt required lower doses of Vancomycin than predicted when last treated.   Estimated Creatinine Clearance: 174.2 mL/min (by C-G formula based on Cr of 0.58).  Goal of Therapy:  Vancomycin trough level  10-15 mcg/ml  Plan:   Vancomycin 1500mg  IV q12hrs  Check trough at steady state  Monitor labs, renal fxn, and c/s  [place] Crane, Garison Gen[place] A 12/07/2014,7:48 AM

## 2014-12-07 NOTE — Progress Notes (Signed)
PROGRESS NOTE  Heather [name] PNT:[id number] DOB: Oct 02, 1975 DOA: 12/06/2014 PCP: [name], MD  Summary: 81 yow PMH panniculitis, admitted, completed outpatient abx, presented to ED 8/31 with pain and redness abdominal wall. Admitted for panniculitis.   Assessment/Plan: 1. Panniculitis with open wound. WBC trending down, remains afebrile. WOC has evaluated and recommends antimicrobial textile (InterDry Ag+) daily use following cleansing for absorption of moisture and moisture wicking, also friction reduction and provision of antimicrobial feature. This is to be used for 5 days for optimal resolution of wound.  2. DM type 2. Well controlled. SSI 3. OSA. Does not use CPAP or oxygen. 4. RLS 5. Anxiety, depression, schizoaffective disorder, bipolar type 6. Morbid obesity    Appears stable, no evidence of abscess, will continue IV abx and follow clinically. No evidence of complicating features.  Add topical therapy   Code Status: FULL DVT prophylaxis: Lovenox Family Communication: Discussed with patient who understands and has no concerns at this time. Disposition Plan: Discharge home in 2-3 days.   [name], MD  [facility]  Pager [phone] If 7PM-7AM, please contact night-coverage at [email or web address], password [facility] 12/07/2014, 7:23 AM  LOS: 1 day   Consultants:  WOC   Procedures:    Antibiotics:  Vancomycin 8/30>>8/30  HPI/Subjective: Feeling better, but there is still a slight odor and pain in the wound. She has had diarrhea the last few days and nausea. When sitting there is increased pressure in the abdomen.    Objective: Filed Vitals:   12/06/14 2054 12/06/14 2250 12/07/14 0132 12/07/14 0522  BP: 138/67 130/59 145/68 153/78  Pulse: 92 92 84 91  Temp:  98.3 F (36.8 C) 98.7 F (37.1 C) 98.7 F (37.1 C)  TempSrc:  Oral Oral Oral  Resp: 18 22 18 20   Height:      Weight:   205.162 kg (452 lb 4.8 oz)   SpO2: 98% 98% 99% 95%   No intake or  output data in the 24 hours ending 12/07/14 0723   Filed Weights   12/06/14 1647 12/07/14 0132  Weight: 202.758 kg (447 lb) 205.162 kg (452 lb 4.8 oz)    Exam:  Afebrile, VSS, not hypoxic  General:  Appears calm and comfortable. Morbidly obese. Cardiovascular: RRR, no m/r/g. 2+ BLE edema . Respiratory: CTA bilaterally, no w/r/r. Normal respiratory effort.  Skin: Erythema right lower pannus with extenstion to the thigh there is a small (dime size) open wound some exudate with no fluctuance.  Musculoskeletal: grossly normal tone BUE/BLE Psychiatric: grossly normal mood and affect, speech fluent and appropriate   New data reviewed:  UA negative.  CBG stable.  WBC improved 11.3 reminder of CBC unremarkable.   Pertinent data since admission:    Pending data:  UC  Scheduled Meds: . sodium chloride   Intravenous STAT  . buPROPion  300 mg Oral Daily  . busPIRone  15 mg Oral BID  . enoxaparin (LOVENOX) injection  0.5 mg/kg Subcutaneous Q24H  . insulin aspart  0-9 Units Subcutaneous TID WC  . mometasone  2 puff Inhalation BID  . montelukast  10 mg Oral QHS  . pantoprazole  40 mg Oral Daily  . rOPINIRole  1 mg Oral QHS  . ziprasidone  60 mg Oral QPM   Continuous Infusions:   Principal Problem:   Panniculitis Active Problems:   Morbid obesity   Essential hypertension   Schizoaffective disorder, bipolar type   DM type 2 (diabetes mellitus, type 2)   Time spent 30 minutes  Sandi Raveling Leonie Green, acting as scribe, recorded this note contemporaneously in the presence of Dr. Melene Plan. Sarajane Jews, M.D. on 12/07/2014 .   I have reviewed the above documentation for accuracy and completeness, and I agree with the above. [name], MD

## 2014-12-08 LAB — HEMOGLOBIN A1C
HEMOGLOBIN A1C: 5.5 % (ref 4.8–5.6)
MEAN PLASMA GLUCOSE: 111 mg/dL

## 2014-12-08 LAB — GLUCOSE, CAPILLARY
GLUCOSE-CAPILLARY: 87 mg/dL (ref 65–99)
GLUCOSE-CAPILLARY: 90 mg/dL (ref 65–99)
Glucose-Capillary: 79 mg/dL (ref 65–99)
Glucose-Capillary: 126 mg/dL — ABNORMAL HIGH (ref 65–99)

## 2014-12-08 LAB — URINE CULTURE: Culture: NO GROWTH

## 2014-12-08 MED ORDER — POLYETHYLENE GLYCOL 3350 17 G PO PACK
17.0000 g | PACK | Freq: Two times a day (BID) | ORAL | Status: DC
  Filled 2014-12-08 (×2): qty 1

## 2014-12-08 NOTE — Progress Notes (Signed)
PROGRESS NOTE  Heather [name]:[id number] DOB: Jul 05, 1975 DOA: 12/06/2014 PCP: [name], MD  Summary: 48 yow PMH panniculitis, admitted, completed outpatient abx, presented to ED 8/31 with pain and redness abdominal wall. Admitted for panniculitis.   Assessment/Plan: 1. Panniculitis with open wound, improving. Remains afebrile. WOC has evaluated and provided wound care instructions.  2. DM type 2. Well controlled. SSI 3. OSA. Does not use CPAP or oxygen. 4. RLS 5. Anxiety, depression, schizoaffective disorder, bipolar type 6. Morbid obesity    Panniculitis and wound greatly improved  Likely transition to oral antibiotics tomorrow.  Anticipate discharge home tomorrow.   Code Status: FULL DVT prophylaxis: Lovenox Family Communication: Discussed with patient who understands and has no concerns at this time. Disposition Plan: Discharge home within 24 hours days .   [name], MD  [facility]  Pager [phone] If 7PM-7AM, please contact night-coverage at [email or web address], password [facility] 12/08/2014, 8:09 AM  LOS: 2 days   Consultants:  WOC   Procedures:    Antibiotics:  Vancomycin 8/30>>8/30  Vancomycin 8/31>>  HPI/Subjective: Complains of periumbilical abdominal pain, off and on, chronic issue. Followed by GI. Suspected IBS. Eating ok.   Objective: Filed Vitals:   12/07/14 2015 12/07/14 2124 12/08/14 0442 12/08/14 0508  BP:  125/82 128/69 111/63  Pulse:  84 112 77  Temp:  98 F (36.7 C) 98.5 F (36.9 C) 98.5 F (36.9 C)  TempSrc:  Oral Axillary Oral  Resp:  21 20 21   Height:      Weight:      SpO2: 97% 99% 100% 100%    Intake/Output Summary (Last 24 hours) at 12/08/14 0809 Last data filed at 12/08/14 0616  Gross per 24 hour  Intake    980 ml  Output    700 ml  Net    280 ml     Filed Weights   12/06/14 1647 12/07/14 0132  Weight: 202.758 kg (447 lb) 205.162 kg (452 lb 4.8 oz)    Exam:  Afebrile, VSS, not hypoxic.   General:   Appears calm and comfortable, sitting in chair. Morbidly obese. Cardiovascular: RRR, no m/r/g. 2+ BLE edema . Respiratory: CTA bilaterally, no w/r/r. Normal respiratory effort.  Skin: The large area of erythema has dramatically improved RLQ pannus. The small open wound inflammation appears resolved and no drainage seen. Still some erythema of the right thigh noted. .  Musculoskeletal: grossly normal tone BUE/BLE Psychiatric: grossly normal mood and affect, speech fluent and appropriate  New data reviewed:  No new data   Pertinent data since admission:    Pending data:  UC  Scheduled Meds: . budesonide  0.5 mg Inhalation BID  . buPROPion  300 mg Oral Daily  . busPIRone  15 mg Oral BID  . enoxaparin (LOVENOX) injection  0.5 mg/kg Subcutaneous Q24H  . insulin aspart  0-9 Units Subcutaneous TID WC  . montelukast  10 mg Oral QHS  . nystatin   Topical BID  . pantoprazole  40 mg Oral Daily  . rOPINIRole  1 mg Oral QHS  . vancomycin  1,500 mg Intravenous Q12H  . ziprasidone  60 mg Oral QPM   Continuous Infusions:   Principal Problem:   Panniculitis Active Problems:   Morbid obesity   Essential hypertension   Schizoaffective disorder, bipolar type   DM type 2 (diabetes mellitus, type 2)   Time spent 20 minutes  I, [name], acting as scribe, recorded this note contemporaneously in the presence of Dr. Quillian Quince  Wiliam Ke, M.D. on 12/08/2014 .   I have reviewed the above documentation for accuracy and completeness, and I agree with the above. [name], MD

## 2014-12-09 LAB — BASIC METABOLIC PANEL
ANION GAP: 5 (ref 5–15)
BUN: 6 mg/dL (ref 6–20)
CHLORIDE: 104 mmol/L (ref 101–111)
CO2: 26 mmol/L (ref 22–32)
Calcium: 8.5 mg/dL — ABNORMAL LOW (ref 8.9–10.3)
Creatinine, Ser: 0.58 mg/dL (ref 0.44–1.00)
Glucose, Bld: 100 mg/dL — ABNORMAL HIGH (ref 65–99)
POTASSIUM: 3.6 mmol/L (ref 3.5–5.1)
SODIUM: 135 mmol/L (ref 135–145)

## 2014-12-09 LAB — GLUCOSE, CAPILLARY
GLUCOSE-CAPILLARY: 74 mg/dL (ref 65–99)
GLUCOSE-CAPILLARY: 86 mg/dL (ref 65–99)

## 2014-12-09 LAB — VANCOMYCIN, TROUGH: VANCOMYCIN TR: 10 ug/mL (ref 10.0–20.0)

## 2014-12-09 MED ORDER — NYSTATIN 100000 UNIT/GM EX POWD: CUTANEOUS | Status: DC

## 2014-12-09 MED ORDER — DOXYCYCLINE HYCLATE 100 MG PO TABS: 100.0000 mg | ORAL_TABLET | Freq: Two times a day (BID) | ORAL | Status: DC

## 2014-12-09 NOTE — Discharge Summary (Signed)
Physician Discharge Summary  Heather [name] [id number] DOB: [birth year] DOA: 12/06/2014  PCP: [name], MD  Admit date: 12/06/2014 Discharge date: 12/09/2014  Recommendations for Outpatient Follow-up:  1. Follow up with PCP as scheduled, f/u resolution of cellulitis and wound on pannus 2. WOC recommend antimicrobial textile (InterDry Ag+) daily use following cleansing for absorption of moisture and moisture wicking, also friction reduction and provision of antimicrobial feature.This product should remain in place for 5 days for optimal plan of care to provide antimicrobial benefits and wick moisture away from skin. Aquacel to provide antimicrobial benefits and absorb drainage to abd wound, covered with foam dressing to promote healing. 3. Follow up with [facility] wound care center (patient will arrange). 4. Start Doxycycline and continue until 9/6.  Follow-up Information    Follow up with [place].   Contact information:   [address] [place] [address] [phone]       Follow up with [name], MD.   Specialty:  Family Medicine   Why:  keep already scheduled appointment with Dr. Anabel Halon information:   [place] [id number] [phone]       Follow up with [place]             . Schedule an appointment as soon as possible for a visit on 12/21/2014.   Why:  at 8:30 am   Contact information:   [address] [place] [phone]       Follow-up Information    Follow up with [place].   Contact information:   [address] [place] [address] [phone]      Discharge Diagnoses:  1. Panniculitis with open wound  2. DM Type 2 3. Anxiety, depression, schizoaffective disorder, bipolar 4. Morbid obesity  Discharge Condition: Improved Disposition: Discharge home   Diet recommendation:  Regular   Filed Weights   12/06/14 1647 12/07/14 0132  Weight: 202.758 kg (447 lb) 205.162 kg (452 lb 4.8 oz)    History of present illness:  66 yow PMH panniculitis, admitted, completed outpatient abx, presented to ED 8/31 with pain and redness abdominal wall. Admitted for panniculitis.   Hospital Course:  Panniculitis with open wound showed excellent improvement with antibiotics and appropriate wound care. As recommended by wound care, the wound was treated with antimicrobial textile (InterDry Ag+)  following cleansing for absorption of moisture and moisture wicking. This is instructed to stay in place for 5 days for optimal improvement. Instructed to call [facility] wound care center for further outpatient treatment. Plan home on oral abx. All other issues remained stable during hospitalization.   Individual issues as below:  1. Panniculitis with open wound, continues to rapidly improving.WOC has evaluated and provided wound care instructions.  2. DM type 2. Well controlled. SSI 3. Anxiety, depression, schizoaffective disorder, bipolar type 4. Morbid obesity  Consultants:  WOC  Procedures:  None  Antibiotics:  Vancomycin 8/30>>8/30  Vancomycin 8/31>>9/2  Doxycycline 9/2>>9/6  Discharge Instructions Discharge Instructions    Diet - low sodium heart healthy    Complete by:  As directed      Diet Carb Modified    Complete by:  As directed      Discharge instructions    Complete by:  As directed   Call your physician or seek immediate medical attention for fever, pain, increased redness or worsening of condition.  Discharge wound care:    Complete by:  As directed   Recommend antimicrobial textile (InterDry Ag+) daily use following cleansing for absorption of moisture and moisture wicking, also friction reduction and provision of antimicrobial feature.This product should remain in place for 5 days for optimal plan of care to provide antimicrobial benefits and wick  moisture away from skin. Aquacel to provide antimicrobial benefits and absorb drainage to abd wound, covered with foam dressing to promote healing.     Increase activity slowly    Complete by:  As directed             Current Discharge Medication List    START taking these medications   Details  doxycycline (VIBRA-TABS) 100 MG tablet Take 1 tablet (100 mg total) by mouth every 12 (twelve) hours. Qty: 12 tablet, Refills: 0    nystatin (MYCOSTATIN/NYSTOP) 100000 UNIT/GM POWD Apply to affected area BID. Qty: 60 g, Refills: 0      CONTINUE these medications which have NOT CHANGED   Details  acetaminophen (TYLENOL) 500 MG tablet Take 1,000 mg by mouth every 6 (six) hours as needed. pain    albuterol (PROVENTIL HFA;VENTOLIN HFA) 108 (90 BASE) MCG/ACT inhaler Inhale 1-2 puffs into the lungs every 6 (six) hours as needed for wheezing. Qty: 1 Inhaler, Refills: 0    buPROPion (WELLBUTRIN XL) 300 MG 24 hr tablet Take 300 mg by mouth daily. Refills: 3    busPIRone (BUSPAR) 10 MG tablet Take 15 mg by mouth 2 (two) times daily. For anxiety    dicyclomine (BENTYL) 20 MG tablet Take 1 tablet (20 mg total) by mouth 3 (three) times daily before meals. As needed for abdominal cramps or diarrhea Qty: 90 tablet, Refills: 2    fluticasone (FLONASE) 50 MCG/ACT nasal spray Place 1-2 sprays into both nostrils daily. Refills: 3    furosemide (LASIX) 40 MG tablet Take 40 mg by mouth daily.  Refills: 0    metFORMIN (GLUCOPHAGE) 500 MG tablet Take 500 mg by mouth daily with breakfast.   Associated Diagnoses: Leukocytosis; Thrombocytosis    mometasone (ASMANEX) 220 MCG/INH inhaler Inhale 2 puffs into the lungs 2 (two) times daily.     montelukast (SINGULAIR) 10 MG tablet Take 10 mg by mouth at bedtime.    pantoprazole (PROTONIX) 40 MG tablet TAKE ONE TABLET BY MOUTH DAILY Qty: 30 tablet, Refills: 11    potassium chloride (MICRO-K) 10 MEQ CR capsule Take 10 mEq by mouth 2 (two) times daily.      Prenatal Vit-Fe Fumarate-FA (PRENATAL MULTIVITAMIN) TABS Take 1 tablet by mouth daily.     rOPINIRole (REQUIP) 0.5 MG tablet Take 1 mg by mouth at bedtime.  Refills: 5    vitamin E 400 UNIT capsule Take 400 Units by mouth daily.    ziprasidone (GEODON) 60 MG capsule Take 60 mg by mouth every evening.     medroxyPROGESTERone (PROVERA) 10 MG tablet Take 2 tablets by mouth daily. For five days each month      STOP taking these medications     multivitamin (VIT W/EXTRA C) CHEW chewable tablet      sulfamethoxazole-trimethoprim (BACTRIM DS,SEPTRA DS) 800-160 MG per tablet        Allergies  Allergen Reactions  . Bee Venom Shortness Of Breath  . Nutritional Supplements Anaphylaxis  . Penicillins Anaphylaxis    REACTION: Angioedema  . Adhesive [Tape] Rash  . Latex Rash  . Vancomycin Other (See Comments)    Pt can tolerate Vancomycin but did  cause Red-Man Syndrome.  Recommend to pre-medicate with Benadryl before doses administered.      The results of significant diagnostics from this hospitalization (including imaging, microbiology, ancillary and laboratory) are listed below for reference.    Significant Diagnostic Studies:   Microbiology: Recent Results (from the past 240 hour(s))  Urine culture     Status: None   Collection Time: 12/06/14  8:49 PM  Result Value Ref Range Status   Specimen Description URINE, CATHETERIZED  Final   Special Requests NONE  Final   Culture   Final    NO GROWTH 1 DAY Performed at [facility]    Report Status 12/08/2014 FINAL  Final     Labs: Basic Metabolic Panel:  Recent Labs Lab 12/06/14 2012 12/07/14 0448  NA 137 137  K 3.4* 3.6  CL 103 106  CO2 25 23  GLUCOSE 89 88  BUN 8 6  CREATININE 0.62 0.58  CALCIUM 9.0 8.7*   Liver Function Tests:  Recent Labs Lab 12/07/14 0448  AST 21  ALT 17  ALKPHOS 46  BILITOT 0.4  PROT 6.9  ALBUMIN 3.4*   CBC:  Recent Labs Lab 12/06/14 2012 12/07/14 0454  WBC 14.4* 11.3*   NEUTROABS 9.8*  --   HGB 12.0 11.6*  HCT 37.9 37.9  MCV 86.3 86.7  PLT 401* 348   CBG:  Recent Labs Lab 12/07/14 2126 12/08/14 0725 12/08/14 1148 12/08/14 1713 12/08/14 2110  GLUCAP 97 79 87 90 126*    Principal Problem:   Panniculitis Active Problems:   Morbid obesity   Essential hypertension   Schizoaffective disorder, bipolar type   DM type 2 (diabetes mellitus, type 2)   Time coordinating discharge: 25 minutes    Signed:  [name], MD [facility] 12/09/2014, 6:52 AM   I, Laban Emperor. Leonie Green, acting as scribe, recorded this note contemporaneously in the presence of Dr. Melene Plan. Sarajane Jews, M.D. on 12/09/2014 .  I have reviewed the above documentation for accuracy and completeness, and I agree with the above. [name], MD

## 2014-12-09 NOTE — Progress Notes (Signed)
ANTIBIOTIC CONSULT NOTE - FOLLOW UP  Pharmacy Consult for Vancomycin Indication: panniculitis / cellulitis  Allergies  Allergen Reactions  . Bee Venom Shortness Of Breath  . Nutritional Supplements Anaphylaxis  . Penicillins Anaphylaxis    REACTION: Angioedema  . Adhesive [Tape] Rash  . Latex Rash  . Vancomycin Other (See Comments)    Pt can tolerate Vancomycin but did cause Red-Man Syndrome.  Recommend to pre-medicate with Benadryl before doses administered.     Patient Measurements: Height: 5\' 3"  (160 cm) Weight: (!) 452 lb 4.8 oz (205.162 kg) IBW/kg (Calculated) : 52.4  Vital Signs: Temp: 98.8 F (37.1 C) (09/02 0406) Temp Source: Oral (09/02 0406) BP: 124/65 mmHg (09/02 0406) Pulse Rate: 71 (09/02 0406) Intake/Output from previous day: 09/01 0701 - 09/02 0700 In: 1720 [P.O.:720; IV Piggyback:1000] Out: 700 [Urine:700] Intake/Output from this shift: Total I/O In: 240 [P.O.:240] Out: -   Labs:  Recent Labs  12/06/14 2012 12/07/14 0448 12/07/14 0454 12/09/14 0949  WBC 14.4*  --  11.3*  --   HGB 12.0  --  11.6*  --   PLT 401*  --  348  --   CREATININE 0.62 0.58  --  0.58   Estimated Creatinine Clearance: 174.2 mL/min (by C-G formula based on Cr of 0.58).  Recent Labs  12/09/14 0949  [facility] 10     Microbiology: Recent Results (from the past 720 hour(s))  Culture, blood (routine x 2)     Status: None   Collection Time: 11/11/14  6:40 PM  Result Value Ref Range Status   Specimen Description BLOOD RIGHT HAND  Final   Special Requests BOTTLES DRAWN AEROBIC AND ANAEROBIC 6CC  Final   Culture NO GROWTH 5 DAYS  Final   Report Status 11/16/2014 FINAL  Final  Culture, blood (routine x 2)     Status: None   Collection Time: 11/11/14  6:57 PM  Result Value Ref Range Status   Specimen Description BLOOD LEFT HAND  Final   Special Requests BOTTLES DRAWN AEROBIC ONLY 6CC  Final   Culture NO GROWTH 5 DAYS  Final   Report Status 11/16/2014 FINAL  Final   Urine culture     Status: None   Collection Time: 12/06/14  8:49 PM  Result Value Ref Range Status   Specimen Description URINE, CATHETERIZED  Final   Special Requests NONE  Final   Culture   Final    NO GROWTH 1 DAY Performed at [facility]    Report Status 12/08/2014 FINAL  Final    Anti-infectives    Start     Dose/Rate Route Frequency Ordered Stop   12/07/14 1000  vancomycin (VANCOCIN) 1,500 mg in sodium chloride 0.9 % 500 mL IVPB     1,500 mg 250 mL/hr over 120 Minutes Intravenous Every 12 hours 12/07/14 0748     12/06/14 2100  vancomycin (VANCOCIN) IVPB 1000 mg/200 mL premix     1,000 mg 200 mL/hr over 60 Minutes Intravenous  Once 12/06/14 1928 12/07/14 0000   12/06/14 2030  vancomycin (VANCOCIN) IVPB 1000 mg/200 mL premix  Status:  Discontinued     1,000 mg 200 mL/hr over 60 Minutes Intravenous  Once 12/06/14 1928 12/06/14 1928   12/06/14 2000  vancomycin (VANCOCIN) IVPB 1000 mg/200 mL premix     1,000 mg 200 mL/hr over 60 Minutes Intravenous  Once 12/06/14 1928 12/06/14 2236   12/06/14 1930  vancomycin (VANCOCIN) IVPB 1000 mg/200 mL premix  Status:  Discontinued  1,000 mg 200 mL/hr over 60 Minutes Intravenous  Once 12/06/14 1928 12/06/14 1928     Assessment: 38yo morbidly obese female with good renal fxn.  Pt admitted with panniculitis / cellulitis.  Well known from previous admission. Tolerates Vancomycin when premedicated with PO diphenhydramine.   Vanc trough today is 10  Goal of Therapy:  Vancomycin trough level 10-15 mcg/ml  Plan:   Cont Vancomycin 1500mg  IV q12hrs  Monitor labs, renal fxn, and c/s  Favian Kittleson Poteet 12/09/2014,10:32 AM

## 2014-12-09 NOTE — Care Management Note (Signed)
Case Management Note  Patient Details  Name: Heather [name] MRN: [id number] Date of Birth: 1975/07/26  Expected Discharge Date:                  Expected Discharge Plan:  [place]  In-House Referral:  NA  Discharge planning Services  CM Consult  Post Acute Care Choice:  Home Health Choice offered to:  Patient  DME Arranged:    DME Agency:     HH Arranged:  RN [place] Agency:     Status of Service:  Completed, signed off  Medicare Important Message Given:  Yes-second notification given Date Medicare IM Given:    Medicare IM give by:    Date Additional Medicare IM Given:    Additional Medicare Important Message give by:     If discussed at [place] of Stay Meetings, dates discussed:    Additional Comments: Pt discharging home today with RN services through [facility] for wound care. Pt aware [facility] has 48 hours to begin services. Pt understands and is agreeable. Vaughan Basta, from [facility], has been notified of referral and discharge today and will obtain pt info from chart.  No further CM needs.  Sherald Barge, RN 12/09/2014, 1:35 PM

## 2014-12-09 NOTE — Progress Notes (Signed)
PROGRESS NOTE  Heather [name] [id number]:[id number] DOB: 1976/02/19 DOA: 12/06/2014 PCP: [name], MD  Summary: 23 yow PMH panniculitis, admitted, completed outpatient abx, presented to ED 8/31 with pain and redness abdominal wall. Admitted for panniculitis.   Assessment/Plan: 1. Panniculitis with open wound, continues to rapidly improving. WOC has evaluated and provided wound care instructions.  2. DM type 2. Well controlled. SSI 3. Anxiety, depression, schizoaffective disorder, bipolar type 4. Morbid obesity    Panniculitis and wound rapidly improved.   Change to oral abx   Discharge home with [facility] RN for assistance with wound care.   Code Status: FULL DVT prophylaxis: Lovenox Family Communication: Discussed with patient who understands and has no concerns at this time. Disposition Plan: Discharge home today    [name], MD  [facility]  Pager [phone] If 7PM-7AM, please contact night-coverage at [email or web address], password [facility] 12/09/2014, 6:46 AM  LOS: 3 days   Consultants:  WOC   Procedures:    Antibiotics:  Vancomycin 8/30>>8/30  Vancomycin 8/31>>9/2  Doxycycline 9/2>>9/6  HPI/Subjective: Feels better and is ready to go home. Was able to take a shower without difficulty. The wound is doing better.   Objective: Filed Vitals:   12/08/14 1422 12/08/14 2027 12/08/14 2108 12/09/14 0406  BP: 135/76  151/88 124/65  Pulse: 80  85 71  Temp: 98.2 F (36.8 C)  98 F (36.7 C) 98.8 F (37.1 C)  TempSrc:   Oral Oral  Resp:   20 20  Height:      Weight:      SpO2: 93% 100% 96% 99%    Intake/Output Summary (Last 24 hours) at 12/09/14 0646 Last data filed at 12/09/14 0624  Gross per 24 hour  Intake   1720 ml  Output    700 ml  Net   1020 ml     Filed Weights   12/06/14 1647 12/07/14 0132  Weight: 202.758 kg (447 lb) 205.162 kg (452 lb 4.8 oz)    Exam:  Afebrile, VSS, not hypoxic.   General:  Appears calm and comfortable, sitting in chair.  Morbidly obese. Cardiovascular: RRR, no m/r/g. 2+ BLE edema . Respiratory: CTA bilaterally, no w/r/r. Normal respiratory effort.  Skin:  Much improved. Some residual erythema in deep crease but the open wound continues to improve with no tenderness to palpation Musculoskeletal: grossly normal tone BUE/BLE Psychiatric: grossly normal mood and affect, speech fluent and appropriate  New data reviewed:  CBG is stable   CMP WNL  Pertinent data since admission:    Pending data:  UC  Scheduled Meds: . budesonide  0.5 mg Inhalation BID  . buPROPion  300 mg Oral Daily  . busPIRone  15 mg Oral BID  . enoxaparin (LOVENOX) injection  0.5 mg/kg Subcutaneous Q24H  . insulin aspart  0-9 Units Subcutaneous TID WC  . montelukast  10 mg Oral QHS  . nystatin   Topical BID  . pantoprazole  40 mg Oral Daily  . polyethylene glycol  17 g Oral BID  . rOPINIRole  1 mg Oral QHS  . vancomycin  1,500 mg Intravenous Q12H  . ziprasidone  60 mg Oral QPM   Continuous Infusions:   Principal Problem:   Panniculitis Active Problems:   Morbid obesity   Essential hypertension   Schizoaffective disorder, bipolar type   DM type 2 (diabetes mellitus, type 2)    I, [name], acting as scribe, recorded this note contemporaneously in the presence of Dr. Melene Plan. Sarajane Jews, M.D.  on 12/09/2014 .   I have reviewed the above documentation for accuracy and completeness, and I agree with the above. [name], MD

## 2014-12-09 NOTE — Care Management Important Message (Signed)
Important Message  Patient Details  Name: Heather [name] MRN: [id number] Date of Birth: 04-17-1975   Medicare Important Message Given:  Yes-second notification given    Sherald Barge, RN 12/09/2014, 1:35 PM

## 2014-12-09 NOTE — Progress Notes (Signed)
D/c instructions reviewed with patient and family.  Verbalized understanding.  Abd dressing changed per protocol.  Pt dc'd to home with family.

## 2014-12-21 ENCOUNTER — Encounter (HOSPITAL_BASED_OUTPATIENT_CLINIC_OR_DEPARTMENT_OTHER): Payer: Medicare Other | Attending: Surgery

## 2014-12-21 DIAGNOSIS — M1990 Unspecified osteoarthritis, unspecified site: Secondary | ICD-10-CM | POA: Diagnosis not present

## 2014-12-21 DIAGNOSIS — L02211 Cutaneous abscess of abdominal wall: Secondary | ICD-10-CM | POA: Insufficient documentation

## 2014-12-21 DIAGNOSIS — G4730 Sleep apnea, unspecified: Secondary | ICD-10-CM | POA: Diagnosis not present

## 2014-12-21 DIAGNOSIS — X58XXXS Exposure to other specified factors, sequela: Secondary | ICD-10-CM | POA: Diagnosis not present

## 2014-12-21 DIAGNOSIS — J45909 Unspecified asthma, uncomplicated: Secondary | ICD-10-CM | POA: Diagnosis not present

## 2014-12-21 DIAGNOSIS — S31103S Unspecified open wound of abdominal wall, right lower quadrant without penetration into peritoneal cavity, sequela: Secondary | ICD-10-CM | POA: Diagnosis not present

## 2014-12-21 DIAGNOSIS — S31103A Unspecified open wound of abdominal wall, right lower quadrant without penetration into peritoneal cavity, initial encounter: Secondary | ICD-10-CM | POA: Diagnosis not present

## 2014-12-21 DIAGNOSIS — B369 Superficial mycosis, unspecified: Secondary | ICD-10-CM | POA: Diagnosis not present

## 2014-12-22 ENCOUNTER — Ambulatory Visit (HOSPITAL_COMMUNITY): Payer: Self-pay | Admitting: Hematology & Oncology

## 2014-12-28 DIAGNOSIS — L03311 Cellulitis of abdominal wall: Secondary | ICD-10-CM | POA: Diagnosis not present

## 2014-12-28 DIAGNOSIS — J45909 Unspecified asthma, uncomplicated: Secondary | ICD-10-CM | POA: Diagnosis not present

## 2014-12-28 DIAGNOSIS — L02211 Cutaneous abscess of abdominal wall: Secondary | ICD-10-CM | POA: Diagnosis not present

## 2014-12-28 DIAGNOSIS — F3341 Major depressive disorder, recurrent, in partial remission: Secondary | ICD-10-CM | POA: Diagnosis not present

## 2014-12-28 DIAGNOSIS — G4730 Sleep apnea, unspecified: Secondary | ICD-10-CM | POA: Diagnosis not present

## 2014-12-28 DIAGNOSIS — S31103S Unspecified open wound of abdominal wall, right lower quadrant without penetration into peritoneal cavity, sequela: Secondary | ICD-10-CM | POA: Diagnosis not present

## 2014-12-28 DIAGNOSIS — B369 Superficial mycosis, unspecified: Secondary | ICD-10-CM | POA: Diagnosis not present

## 2014-12-28 DIAGNOSIS — H81393 Other peripheral vertigo, bilateral: Secondary | ICD-10-CM | POA: Diagnosis not present

## 2014-12-30 DIAGNOSIS — F329 Major depressive disorder, single episode, unspecified: Secondary | ICD-10-CM | POA: Diagnosis not present

## 2014-12-30 DIAGNOSIS — L03319 Cellulitis of trunk, unspecified: Secondary | ICD-10-CM | POA: Diagnosis not present

## 2014-12-30 DIAGNOSIS — E669 Obesity, unspecified: Secondary | ICD-10-CM | POA: Diagnosis not present

## 2015-01-04 ENCOUNTER — Encounter (HOSPITAL_COMMUNITY): Payer: Medicare Other | Attending: Hematology & Oncology | Admitting: Hematology & Oncology

## 2015-01-04 ENCOUNTER — Encounter (HOSPITAL_COMMUNITY): Payer: Self-pay | Admitting: Hematology & Oncology

## 2015-01-04 ENCOUNTER — Encounter (HOSPITAL_BASED_OUTPATIENT_CLINIC_OR_DEPARTMENT_OTHER): Payer: Medicare Other

## 2015-01-04 VITALS — BP 134/63 | HR 81 | Temp 98.7°F | Resp 18 | Wt >= 6400 oz

## 2015-01-04 DIAGNOSIS — D72829 Elevated white blood cell count, unspecified: Secondary | ICD-10-CM

## 2015-01-04 LAB — CBC WITH DIFFERENTIAL/PLATELET
BASOS ABS: 0 10*3/uL (ref 0.0–0.1)
Basophils Relative: 0 %
EOS ABS: 0.4 10*3/uL (ref 0.0–0.7)
EOS PCT: 3 %
HCT: 39.6 % (ref 36.0–46.0)
Hemoglobin: 12.4 g/dL (ref 12.0–15.0)
LYMPHS PCT: 21 %
Lymphs Abs: 2.6 10*3/uL (ref 0.7–4.0)
MCH: 27.2 pg (ref 26.0–34.0)
MCHC: 31.3 g/dL (ref 30.0–36.0)
MCV: 86.8 fL (ref 78.0–100.0)
MONO ABS: 0.5 10*3/uL (ref 0.1–1.0)
Monocytes Relative: 5 %
Neutro Abs: 8.6 10*3/uL — ABNORMAL HIGH (ref 1.7–7.7)
Neutrophils Relative %: 71 %
PLATELETS: 397 10*3/uL (ref 150–400)
RBC: 4.56 MIL/uL (ref 3.87–5.11)
RDW: 15.6 % — AB (ref 11.5–15.5)
WBC: 12.1 10*3/uL — ABNORMAL HIGH (ref 4.0–10.5)

## 2015-01-04 NOTE — Progress Notes (Signed)
Labs drawn

## 2015-01-04 NOTE — Patient Instructions (Signed)
..  [place] at [facility] Discharge Instructions  RECOMMENDATIONS MADE BY THE CONSULTANT AND ANY TEST RESULTS WILL BE SENT TO YOUR REFERRING PHYSICIAN.  Exam today per [place] today 6 months labs and Dr. visit   Thank you for choosing [place] at [facility] to provide your oncology and hematology care.  To afford each patient quality time with our provider, please arrive at least 15 minutes before your scheduled appointment time.    You need to re-schedule your appointment should you arrive 10 or more minutes late.  We strive to give you quality time with our providers, and arriving late affects you and other patients whose appointments are after yours.  Also, if you no show three or more times for appointments you may be dismissed from the clinic at the providers discretion.     Again, thank you for choosing [facility].  Our hope is that these requests will decrease the amount of time that you wait before being seen by our physicians.       _____________________________________________________________  Should you have questions after your visit to [facility], please contact our office at [phone] between the hours of 8:30 a.m. and 4:30 p.m.  Voicemails left after 4:30 p.m. will not be returned until the following business day.  For prescription refill requests, have your pharmacy contact our office.

## 2015-01-04 NOTE — Progress Notes (Signed)
[facility] PROGRESS NOTE  Patient Care Team: Lucianne Lei, MD as PCP - General (Family Medicine) Danie Binder, MD as Consulting Physician (Gastroenterology)  CHIEF COMPLAINTS/PURPOSE OF CONSULTATION:  Leukocytosis  HISTORY OF PRESENTING ILLNESS:  Heather [name] 39 y.o. female is here because of leukocytosis.  She has a history of eosinophilic esophagitis and reactive gastropathy diagnosed at [facility] in 2015. Review of her records in EPIC thosed a persistently mild elevation in her total WBC dating back several years ago. She has also had a mild intermittent elevation in her platelets into the low 400 K range.  07/28/2011  Total wbc of 11.2  08/21/2011  Total wbc of 13.3 02/18/2013  Total wbc of 13.9 with a mild neutrophilia, remainder of differential is unremarkable 01/25/2014  Total wbc of 12.4 with a mild neutrophilia, remainder of differential is unremarkable.  Heather [name] is here alone today. She expresses upset at the thought of having her blood drawn. She says she's a "hard stick."  She says she's been getting lightheaded still. She was told it was vertigo, and was put on antivert, but the lightheadedness still occurs.  She was recently hospitalized at [facility] for paniculitis. When she left the hospital, she was on antibiotics by mouth; and her infection's cleared up now. She says her "wound's closed up and everything, too."  She says she's getting married in a couple of weeks. They're supposed to go to [facility] after the wedding, in November. She says "that's how everything's planned, but things change and pop up. I wasn't planning on being in the hospital." With regards to her trip to the ER, she says "the second time I went, this last time, I was really red down here [she gestures to her lower abdomen]." She says it was like it was "burning."  Heather [name] also remarks that her allergies have been "kind of rough these past couple of days." No drenching  night sweats, no change in appetite. MEDICAL HISTORY:  Past Medical History  Diagnosis Date  . Amenorrhea   . Dysmenorrhea   . COPD (chronic obstructive pulmonary disease)   . Dysrhythmia     DR Johnsie Cancel    . Asthma   . Sleep apnea     CPAP  . Shortness of breath     WITH EXERTION   . GERD (gastroesophageal reflux disease)     HEARTBURN   TUMS  . Neuromuscular disorder     RESTLESS LEG   . Depression   . Anxiety   . Obesity   . Morbid obesity   . Ectopic pregnancy 2013  . Eosinophilic esophagitis     Diagnosed at [facility] 06/16/2013, untreated  . Depression   . Anxiety   . Schizoaffective disorder, bipolar type   . Diabetes mellitus without complication     SURGICAL HISTORY: Past Surgical History  Procedure Laterality Date  . Cholecystectomy    . Tonsillectomy    . Tooth extraction  10/28/2011    Procedure: DENTAL RESTORATION/EXTRACTIONS;  Surgeon: Gae Bon, DDS;  Location: [place];  Service: Oral Surgery;;  . Dental surgery    . Esophagogastroduodenoscopy  May 2007    Dr. Gala Romney: Normal esophagus, stomach, D1, D2  . Esophagogastroduodenoscopy  06/16/2013    Dr. Carlton Adam, eosinophilic esophagitis, reactive gastropathy, no esophageal dilation    SOCIAL HISTORY: Social History   Social History  . Marital Status: Single    Spouse Name: N/A  . Number of Children: 0  .  Years of Education: N/A   Occupational History  . volunteer    Social History Main Topics  . Smoking status: Former Smoker -- 0.50 packs/day for 8 years    Types: Cigarettes    Quit date: 04/25/2011  . Smokeless tobacco: Never Used  . Alcohol Use: No     Comment: occasional   . Drug Use: No  . Sexual Activity: Not Currently    Birth Control/ Protection: None     Comment: stopped smoking in jan.. had a pack the other day   Other Topics Concern  . 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 She  has no children. She has had one tubal pregnancy. She is not married but in a relationship. She quit smoking 6 years ago and smoked one ppd from the age of [age].  She used to drink ETOH but quit in 2010.  FAMILY HISTORY: Family History  Problem Relation Age of Onset  . Depression Mother   . Hypertension Sister   . Diabetes Paternal Uncle   . Anxiety disorder Paternal Uncle   . Colon cancer Maternal Grandmother     45s  . Liver disease Neg Hx    indicated that her mother is alive. She indicated that her sister is alive.   Mother is alive at 105. She has "stomach issues"  Her father died in his 56's from AIDS. Her maternal grandmother had colon cancer.  She did not die from her disease.   ALLERGIES:  is allergic to bee venom; nutritional supplements; penicillins; adhesive; latex; and vancomycin.  MEDICATIONS:  Current Outpatient Prescriptions  Medication Sig Dispense Refill  . acetaminophen (TYLENOL) 500 MG tablet Take 1,000 mg by mouth every 6 (six) hours as needed. pain    . albuterol (PROVENTIL HFA;VENTOLIN HFA) 108 (90 BASE) MCG/ACT inhaler Inhale 1-2 puffs into the lungs every 6 (six) hours as needed for wheezing. 1 Inhaler 0  . buPROPion (WELLBUTRIN XL) 300 MG 24 hr tablet Take 300 mg by mouth daily.  3  . busPIRone (BUSPAR) 10 MG tablet Take 15 mg by mouth 2 (two) times daily. For anxiety    . dicyclomine (BENTYL) 20 MG tablet Take 1 tablet (20 mg total) by mouth 3 (three) times daily before meals. As needed for abdominal cramps or diarrhea 90 tablet 2  . fluticasone (FLONASE) 50 MCG/ACT nasal spray Place 1-2 sprays into both nostrils daily.  3  . furosemide (LASIX) 40 MG tablet Take 40 mg by mouth daily.   0  . metFORMIN (GLUCOPHAGE) 500 MG tablet Take 500 mg by mouth daily with breakfast.    . mometasone (ASMANEX) 220 MCG/INH inhaler Inhale 2 puffs into the lungs 2 (two) times daily.     . montelukast (SINGULAIR) 10 MG tablet Take 10 mg by mouth at bedtime.    Marland Kitchen nystatin  (MYCOSTATIN/NYSTOP) 100000 UNIT/GM POWD Apply to affected area BID. 60 g 0  . pantoprazole (PROTONIX) 40 MG tablet TAKE ONE TABLET BY MOUTH DAILY 30 tablet 11  . potassium chloride (MICRO-K) 10 MEQ CR capsule Take 10 mEq by mouth 2 (two) times daily.    . Prenatal Vit-Fe Fumarate-FA (PRENATAL MULTIVITAMIN) TABS Take 1 tablet by mouth daily.     Marland Kitchen rOPINIRole (REQUIP) 0.5 MG tablet Take 1 mg by mouth at bedtime.   5  . vitamin E 400 UNIT capsule Take 400 Units by mouth  daily.    . ziprasidone (GEODON) 60 MG capsule Take 60 mg by mouth every evening.     Marland Kitchen doxycycline (VIBRA-TABS) 100 MG tablet Take 1 tablet (100 mg total) by mouth every 12 (twelve) hours. (Patient not taking: Reported on 01/04/2015) 12 tablet 0  . loratadine (CLARITIN) 10 MG tablet Take 10 mg by mouth daily.  1  . meclizine (ANTIVERT) 25 MG tablet Take 25 mg by mouth as needed.  1  . medroxyPROGESTERone (PROVERA) 10 MG tablet Take 2 tablets by mouth daily. For five days each month     No current facility-administered medications for this visit.   Review of Systems  Constitutional: Negative.   HENT: Negative.   Eyes: Negative.   Respiratory: Negative Cardiovascular: Negative.   Gastrointestinal: Negative Skin: Negative.   Neurological: Negative.   Endo/Heme/Allergies: Negative.   Psychiatric/Behavioral: Negative.    14 point review of systems was performed and is negative except as detailed under history of present illness and above   PHYSICAL EXAMINATION: ECOG PERFORMANCE STATUS: 1 - Symptomatic but completely ambulatory  Filed Vitals:   01/04/15 0925  BP: 134/63  Pulse: 81  Temp: 98.7 F (37.1 C)  Resp: 18   Filed Weights   01/04/15 0925  Weight: 450 lb (204.119 kg)    Physical Exam  Constitutional: She is oriented to person, place, and time and well-developed, well-nourished, and in no distress.  Morbidly Obese  HENT:  Head: Normocephalic and atraumatic.  Nose: Nose normal.  Mouth/Throat: Oropharynx  is clear and moist. No oropharyngeal exudate.  Eyes: Conjunctivae and EOM are normal. Pupils are equal, round, and reactive to light. Right eye exhibits no discharge. Left eye exhibits no discharge. No scleral icterus.  Neck: Normal range of motion. Neck supple. No tracheal deviation present. No thyromegaly present.  Cardiovascular: Normal rate, regular rhythm and normal heart sounds.  Exam reveals no gallop and no friction rub.   No murmur heard. Pulmonary/Chest: Effort normal and breath sounds normal. She has no wheezes. She has no rales.  Abdominal: Soft. Bowel sounds are normal. She exhibits no distension and no mass. There is no tenderness. There is no rebound and no guarding.  Exam limited by obesity; patient remains seated in chair.  Musculoskeletal: Normal range of motion. She exhibits no edema.  Lymphadenopathy:    She has no cervical adenopathy.  Neurological: She is alert and oriented to person, place, and time. She has normal reflexes. No cranial nerve deficit. Gait normal. Coordination normal.  Skin: Skin is warm and dry. No rash noted. She is not diaphoretic.  Psychiatric: Mood, memory, affect and judgment normal.  Nursing note and vitals reviewed.   LABORATORY DATA:  I have reviewed the data as listed Lab Results  Component Value Date   WBC 12.1* 01/04/2015   HGB 12.4 01/04/2015   HCT 39.6 01/04/2015   MCV 86.8 01/04/2015   PLT 397 01/04/2015     Chemistry      Component Value Date/Time   NA 135 12/09/2014 0949   K 3.6 12/09/2014 0949   CL 104 12/09/2014 0949   CO2 26 12/09/2014 0949   BUN 6 12/09/2014 0949   CREATININE 0.58 12/09/2014 0949   CREATININE 0.70 07/14/2014 1451      Component Value Date/Time   CALCIUM 8.5* 12/09/2014 0949   ALKPHOS 46 12/07/2014 0448   AST 21 12/07/2014 0448   ALT 17 12/07/2014 0448   BILITOT 0.4 12/07/2014 0448      ASSESSMENT & PLAN:  LEUKOCYTOSIS NEUTROPHILIA THROMBOCYTOSIS JAK 2 negative BCR ABL negative  ESR  67  39 year old female with a history of eosinophilic esophagitis who presents for further evaluation of chronic leukocytosis/neutrophilia.She has also had intermittent thrombocytosis. She has a history of allergies and has seen an allergist in the past. JAK2 and BCR-ABL are negative. I have sent an analysis for MPL or CALR mutation.   I reviewed the studies we sent at her last visit and all of them are negative. I still suspect the findings are reactive or secondary to inflammation/infllamatory conditition.    I will see her back in six months. If things stay stable for a while, we might move her back out to one year.  Orders Placed This Encounter  Procedures  . JAK2 V617F, Rfx CALR/E12/MPL  . CBC with Differential   I discussed my recommendations in detail and she understands and agrees with the plan as detailed.  All questions were answered. The patient knows to call the clinic with any problems, questions or concerns.  This note was electronically signed.    This document serves as a record of services personally performed by Ancil Linsey, MD. It was created on her behalf by Toni Amend, a trained medical scribe. The creation of this record is based on the scribe's personal observations and the provider's statements to them. This document has been checked and approved by the attending provider.  I have reviewed the above documentation for accuracy and completeness, and I agree with the above.  Kelby Fam. Penland MD

## 2015-01-05 ENCOUNTER — Encounter (HOSPITAL_COMMUNITY): Payer: Self-pay | Admitting: Hematology & Oncology

## 2015-01-05 DIAGNOSIS — Z23 Encounter for immunization: Secondary | ICD-10-CM | POA: Diagnosis not present

## 2015-01-06 ENCOUNTER — Encounter: Payer: Medicare Other | Attending: Family Medicine | Admitting: Nutrition

## 2015-01-06 ENCOUNTER — Encounter: Payer: Self-pay | Admitting: Nutrition

## 2015-01-06 VITALS — Ht 63.0 in | Wt >= 6400 oz

## 2015-01-06 DIAGNOSIS — Z6841 Body Mass Index (BMI) 40.0 and over, adult: Secondary | ICD-10-CM | POA: Diagnosis not present

## 2015-01-06 DIAGNOSIS — R739 Hyperglycemia, unspecified: Secondary | ICD-10-CM | POA: Diagnosis not present

## 2015-01-06 DIAGNOSIS — Z713 Dietary counseling and surveillance: Secondary | ICD-10-CM | POA: Diagnosis not present

## 2015-01-06 DIAGNOSIS — IMO0002 Reserved for concepts with insufficient information to code with codable children: Secondary | ICD-10-CM

## 2015-01-06 NOTE — Patient Instructions (Signed)
Goals 1. Follow My Plate 2. Increase more fresh fruits and vegetables. 3. Decrease carbs to 3 sevings per meal 4. Drink only water 5. Lose 1-2 lbs per week til next visit. 6. Avoid skipping meals. 7. Avoid snacks between meals.

## 2015-01-06 NOTE — Progress Notes (Signed)
  Medical Nutrition Therapy:  Appt start time: 1030 end time:  1130.   Assessment:  Primary concerns today: OBeisty. Prediabetes A1C 5.7%. Lives with her boyfriend. She does the shopping and cooking. Most foods are baked and broiled and some fry.  Recently lost about 15 pounds. Most she ever weighed was 480 lbs. Admits to emotionally eating more then than she does now. On Metformin 500 mg daily for about a year. Not testing bloods sugars. Physical activity: walking some and goes [employer] and swims. She admits she needs to do it more than she has been. She does't drive. Changes made recently: gave ice cream, cut out sweet tea, and drinking water now. Trying to cut out junk food. Boyfriend is a bad influence with poor eating habits. He is on disability also and not working.  Diet is excessive in calories, protein, fat, sodium, and sugar. Diet is low in fresh fruits and vegetables. Diet is high in processed foods but willing to make changes to improve weight and feel better and prevent DM.  Preferred Learning Style:   Auditory  Visual  Hands on  Learning Readiness:   Ready  Change in progress   MEDICATIONS: See list   DIETARY INTAKE: 24-hr recall:  B ( AM): Cherrios or apple cinnamon with 2% milk Snk ( AM): protein bar, water  L ( PM): Tunanoodle casserole 1 cup, water Snk ( PM): none D ( PM): Tuna noodle casserole, 1 cup water Snk ( PM): none Beverages: water.  Usual physical activity: walking some  Estimated energy needs: 1500 calories 170 g carbohydrates 112 g protein 42 g fat  Progress Towards Goal(s):  In progress.   Nutritional Diagnosis:  NB-1.1 Food and nutrition-related knowledge deficit As related to Severe Obesity.  As evidenced by BMI >70.Marland Kitchen    Intervention:  Nutrition counseling and DM education on My Plate, portion sizes, CHO counting, balanced meals, meal planning, benefits of exericise and walking for needed weight loss, slowing down on eating, emotional eating,  low fat low sodium high fiber diet and healthy weight loss tips.  Goals 1. Follow My Plate 2. Increase more fresh fruits and vegetables. 3. Decrease carbs to 3 sevings per meal 4. Drink only water 5. Lose 1-2 lbs per week til next visit. 6. Avoid skipping meals. 7. Avoid snacks between meals. Keep a food journal and bring at next visit.  Teaching Method Utilized:  Visual Auditory Hands on  Handouts given during visit include:  The Plate Method  Meal Plan Card  Diabetes Instructions  Barriers to learning/adherence to lifestyle change: Severe obesity  Demonstrated degree of understanding via:  Teach Back   Monitoring/Evaluation:  Dietary intake, exercise, meal planning, and body weight in 1 month(s).

## 2015-01-16 DIAGNOSIS — F332 Major depressive disorder, recurrent severe without psychotic features: Secondary | ICD-10-CM | POA: Diagnosis not present

## 2015-01-16 LAB — CALR + JAK2 E12-15 + MPL (REFLEXED)

## 2015-01-16 LAB — JAK2 V617F, W REFLEX TO CALR/E12/MPL

## 2015-02-10 ENCOUNTER — Encounter: Payer: Self-pay | Admitting: Nutrition

## 2015-02-10 ENCOUNTER — Encounter: Payer: Medicare Other | Attending: Family Medicine | Admitting: Nutrition

## 2015-02-10 DIAGNOSIS — R739 Hyperglycemia, unspecified: Secondary | ICD-10-CM

## 2015-02-10 DIAGNOSIS — Z6841 Body Mass Index (BMI) 40.0 and over, adult: Secondary | ICD-10-CM | POA: Diagnosis not present

## 2015-02-10 DIAGNOSIS — E739 Lactose intolerance, unspecified: Secondary | ICD-10-CM | POA: Insufficient documentation

## 2015-02-10 DIAGNOSIS — Z713 Dietary counseling and surveillance: Secondary | ICD-10-CM | POA: Insufficient documentation

## 2015-02-10 NOTE — Progress Notes (Signed)
  Medical Nutrition Therapy:  Appt start time: 1400 end time:  1430.  Assessment:  Primary concerns today: Obesity. Prediabetes A1C 5.7%. Changed: drinking more water, cut down on portions, eating more fresh fruits and vegetable. Feels better. Eating more yogurt  Cut out snacks between meals. Walking now more at [employer] and at school. Lost 2 lbs since last visit a month ago. Got married recently and feels she would have lost more without all the celebrations. Food choices are improving some. Still needs more fresh fruits and vegetables. Finances limit food choices at end of the month.  Preferred Learning Style:   Auditory  Visual  Hands on  Learning Readiness:   Ready  Change in progress   MEDICATIONS: See list   DIETARY INTAKE: 24-hr recall:  B ( AM): Oatmeal OR cherrios with 1% milk and 1/2 banana Snk ( AM):  L ( PM): 1 slice pizza, water. Snk ( PM): none D ( PM): Brunshwich stew 1 cup, water Snk ( PM): none Beverages: water.  Usual physical activity: walking some  Estimated energy needs: 1500 calories 170 g carbohydrates 112 g protein 42 g fat  Progress Towards Goal(s):  In progress.   Nutritional Diagnosis:  NB-1.1 Food and nutrition-related knowledge deficit As related to Severe Obesity.  As evidenced by BMI >70.Marland Kitchen    Intervention:  Nutrition counseling and DM education on My Plate, portion sizes, CHO counting, balanced meals, meal planning, benefits of exericise and walking for needed weight loss, slowing down on eating, emotional eating, low fat low sodium high fiber diet and healthy weight loss tips.  Goals 1. Follow My Plate 2. Increase more fresh fruits and vegetables. 3. Incorporate more exercise to 15 minutes a day. 4. Drinking water only. 5. Lose 1 lb per week. 6. Cut out snacks after supper.   Teaching Method Utilized:  Visual Auditory Hands on  Handouts given during visit include:  The Plate Method  Meal Plan Card  Diabetes  Instructions  Barriers to learning/adherence to lifestyle change: Severe obesity  Demonstrated degree of understanding via:  Teach Back   Monitoring/Evaluation:  Dietary intake, exercise, meal planning, and body weight in 1 month(s).

## 2015-02-10 NOTE — Patient Instructions (Signed)
Goals 1. Follow My Plate 2. Increase more fresh fruits and vegetables. 3. Incorporate more exercise to 15 minutes a day. 4. Drinking water only. 5. Lose 1 lb per week. 6. Cut out snacks after supper.

## 2015-02-13 ENCOUNTER — Ambulatory Visit: Payer: Self-pay | Admitting: Nutrition

## 2015-02-13 ENCOUNTER — Encounter: Payer: Self-pay | Admitting: Gastroenterology

## 2015-02-13 ENCOUNTER — Ambulatory Visit (INDEPENDENT_AMBULATORY_CARE_PROVIDER_SITE_OTHER): Payer: Medicare Other | Admitting: Gastroenterology

## 2015-02-13 VITALS — BP 138/89 | HR 70 | Temp 97.8°F | Ht 63.0 in | Wt >= 6400 oz

## 2015-02-13 DIAGNOSIS — R1033 Periumbilical pain: Secondary | ICD-10-CM

## 2015-02-13 DIAGNOSIS — R195 Other fecal abnormalities: Secondary | ICD-10-CM

## 2015-02-13 NOTE — Progress Notes (Signed)
Primary Care Physician: [name], MD  Primary Gastroenterologist:  Barney Drain, MD   Chief Complaint  Patient presents with  . Abdominal Pain    HPI: Heather [name] is a 39 y.o. female here for follow up. Last seen in 07/2014. History of reactive gastropathy in eosinophilic esophagitis diagnosed at [facility] earlier in 2015. No medications provided. Previously declined fluticasone offered by [place]. Seen by allergist, just environmental allergies noted. History of intermittent abdominal cramping and hard to loose stools, GERD previously.  Patient reports hospitalization in 11/2014 for sepsis related to panniculitis with open wound. Allergic to vancomcin (red man syndrome). Tolerated vanc with benadryl. Has lost 15 pounds since 08/2014. Married in 01/2015.   Has had couple of flares of heartburn in past six months. Pantoprazole once daily. BM regular but loose. No melena, brbpr. Bentyl did not help with abdominal cramps, given at last OV. Central abd pain, menstrual like cramps. Few days per week. Sometimes bm. Sometimes feels like has to go and then can't. Lasts for 30 minutes. sometiems wakes up. Mostly in afternoon.   No prior colonoscopy.   Current Outpatient Prescriptions  Medication Sig Dispense Refill  . acetaminophen (TYLENOL) 500 MG tablet Take 1,000 mg by mouth every 6 (six) hours as needed. pain    . albuterol (PROVENTIL HFA;VENTOLIN HFA) 108 (90 BASE) MCG/ACT inhaler Inhale 1-2 puffs into the lungs every 6 (six) hours as needed for wheezing. 1 Inhaler 0  . buPROPion (WELLBUTRIN XL) 300 MG 24 hr tablet Take 300 mg by mouth daily.  3  . busPIRone (BUSPAR) 10 MG tablet Take 15 mg by mouth 2 (two) times daily. For anxiety    . dicyclomine (BENTYL) 20 MG tablet Take 1 tablet (20 mg total) by mouth 3 (three) times daily before meals. As needed for abdominal cramps or diarrhea 90 tablet 2  . fluticasone (FLONASE) 50 MCG/ACT nasal spray Place 1-2 sprays into both nostrils daily.   3  . furosemide (LASIX) 40 MG tablet Take 40 mg by mouth daily.   0  . loratadine (CLARITIN) 10 MG tablet Take 10 mg by mouth daily.  1  . meclizine (ANTIVERT) 25 MG tablet Take 25 mg by mouth as needed.  1  . metFORMIN (GLUCOPHAGE) 500 MG tablet Take 500 mg by mouth daily with breakfast.    . mometasone (ASMANEX) 220 MCG/INH inhaler Inhale 2 puffs into the lungs 2 (two) times daily.     . montelukast (SINGULAIR) 10 MG tablet Take 10 mg by mouth at bedtime.    . pantoprazole (PROTONIX) 40 MG tablet TAKE ONE TABLET BY MOUTH DAILY 30 tablet 11  . potassium chloride (MICRO-K) 10 MEQ CR capsule Take 10 mEq by mouth 2 (two) times daily.    . Prenatal Vit-Fe Fumarate-FA (PRENATAL MULTIVITAMIN) TABS Take 1 tablet by mouth daily.     Marland Kitchen rOPINIRole (REQUIP) 0.5 MG tablet Take 1 mg by mouth at bedtime.   5  . vitamin E 400 UNIT capsule Take 400 Units by mouth daily.    . ziprasidone (GEODON) 60 MG capsule Take 60 mg by mouth every evening.      No current facility-administered medications for this visit.    Allergies as of 02/13/2015 - Review Complete 02/13/2015  Allergen Reaction Noted  . Bee venom Shortness Of Breath 10/28/2011  . Nutritional supplements Anaphylaxis 06/29/2011  . Penicillins Anaphylaxis 05/12/2006  . Adhesive [tape] Rash 07/31/2011  . Latex Rash 05/17/2014  . Vancomycin Other (See  Comments) 11/11/2014   Past Medical History  Diagnosis Date  . Amenorrhea   . Dysmenorrhea   . COPD (chronic obstructive pulmonary disease) ([place])   . Dysrhythmia     DR Johnsie Cancel    . Asthma   . Sleep apnea     CPAP  . Shortness of breath     WITH EXERTION   . GERD (gastroesophageal reflux disease)     HEARTBURN   TUMS  . Neuromuscular disorder ([facility])     RESTLESS LEG   . Depression   . Anxiety   . Obesity   . Morbid obesity ([place])   . Ectopic pregnancy 2013  . Eosinophilic esophagitis     Diagnosed at [facility]/16/2013, untreated  . Depression   . Anxiety   . Schizoaffective  disorder, bipolar type ([place])   . Diabetes mellitus without complication [facility])    Past Surgical History  Procedure Laterality Date  . Cholecystectomy    . Tonsillectomy    . Tooth extraction  10/28/2011    Procedure: DENTAL RESTORATION/EXTRACTIONS;  Surgeon: Gae Bon, DDS;  Location: [facility];  Service: Oral Surgery;;  . Dental surgery    . Esophagogastroduodenoscopy  May 2007    Dr. Gala Romney: Normal esophagus, stomach, D1, D2  . Esophagogastroduodenoscopy  06/16/2013    Dr. Carlton Adam, eosinophilic esophagitis, reactive gastropathy, no esophageal dilation   Family History  Problem Relation Age of Onset  . Depression Mother   . Hypertension Sister   . Diabetes Paternal Uncle   . Anxiety disorder Paternal Uncle   . Colon polyps Maternal Grandmother     6s  . Liver disease Neg Hx   . Crohn's disease Maternal Aunt    Social History   Social History  . Marital Status: Single    Spouse Name: N/A  . Number of Children: 0  . Years of Education: N/A   Occupational History  . volunteer    Social History Main Topics  . Smoking status: Former Smoker -- 0.50 packs/day for 8 years    Types: Cigarettes    Quit date: 04/25/2011  . Smokeless tobacco: Never Used  . Alcohol Use: No     Comment: occasional   . Drug Use: No  . Sexual Activity: Not Currently    Birth Control/ Protection: None     Comment: stopped smoking in jan.. had a pack the other day   Other Topics Concern  . None   Social History Narrative     ROS:  General: Negative for anorexia, unintentional weight loss, fever, chills, fatigue, weakness. ENT: Negative for hoarseness, difficulty swallowing , nasal congestion. CV: Negative for chest pain, angina, palpitations, dyspnea on exertion, peripheral edema.  Respiratory: Negative for dyspnea at rest, dyspnea on exertion, cough, sputum, wheezing.  GI: See history of present illness. GU:  Negative for dysuria, hematuria, urinary incontinence, urinary frequency,  nocturnal urination.  Endo: Negative for unusual weight change.    Physical Examination:   BP 138/89 mmHg  Pulse 70  Temp(Src) 97.8 F (36.6 C)  Ht 5\' 3"  (1.6 m)  Wt 444 lb 6.4 oz (201.579 kg)  BMI 78.74 kg/m2  General: morbidly obese, well-groomed WF in no acute distress.  Eyes: No icterus. Mouth: Oropharyngeal mucosa moist and pink , no lesions erythema or exudate. Lungs: Clear to auscultation bilaterally.  Heart: Regular rate and rhythm, no murmurs rubs or gallops.  Abdomen: Bowel sounds are normal, nontender, obese, exam limited due to body habitus.  no abdominal bruits or  hernia , no rebound or guarding.   Extremities: 1+ lower extremity edema. No clubbing or deformities. Neuro: Alert and oriented x 4   Skin: Warm and dry, no jaundice.   Psych: Alert and cooperative, normal mood and affect.  Labs:  Lab Results  Component Value Date   CREATININE 0.58 12/09/2014   BUN 6 12/09/2014   NA 135 12/09/2014   K 3.6 12/09/2014   CL 104 12/09/2014   CO2 26 12/09/2014   Lab Results  Component Value Date   ALT 17 12/07/2014   AST 21 12/07/2014   ALKPHOS 46 12/07/2014   BILITOT 0.4 12/07/2014   Lab Results  Component Value Date   WBC 12.1* 01/04/2015   HGB 12.4 01/04/2015   HCT 39.6 01/04/2015   MCV 86.8 01/04/2015   PLT 397 01/04/2015     Imaging Studies: No results found.

## 2015-02-13 NOTE — Patient Instructions (Signed)
Let me discuss your case with Dr. Oneida Alar and I will get back with you regarding further recommendations about your abdominal cramps.

## 2015-02-19 ENCOUNTER — Other Ambulatory Visit: Payer: Self-pay | Admitting: Allergy and Immunology

## 2015-02-19 ENCOUNTER — Encounter: Payer: Self-pay | Admitting: Gastroenterology

## 2015-02-19 NOTE — Assessment & Plan Note (Signed)
Ongoing central abdominal cramping with loose stools, limited number per day. Trial of bentyl did not help. No prior TCS or CT.FH of Crohns.  May need further work up. To discuss with Dr. Oneida Alar.

## 2015-02-20 ENCOUNTER — Other Ambulatory Visit: Payer: Self-pay | Admitting: Allergy and Immunology

## 2015-02-20 NOTE — Progress Notes (Signed)
cc'ed to pcp °

## 2015-02-20 NOTE — Progress Notes (Signed)
REVIEWED. PT SHOULD SEE [facility] TO DISCUSS SYMPTOMS AND BENEFITS V. RISKS OF TCS/CT SCAN. WEIGHT LIMIT FOR CT AT [facility] IS 440 LBS.

## 2015-02-21 ENCOUNTER — Encounter: Payer: Self-pay | Admitting: Gastroenterology

## 2015-02-21 ENCOUNTER — Ambulatory Visit: Payer: Medicare Other | Admitting: Allergy and Immunology

## 2015-03-10 NOTE — Telephone Encounter (Signed)
Please let patient know that Dr. Oneida Alar recommends she go back to [facility] to discuss symptoms and benefits v. Risks of TCS or CT scan.   Weight limit at [facility] is 440 lb so it cannot be done locally.

## 2015-03-13 NOTE — Telephone Encounter (Signed)
LMOM for a return call.  

## 2015-03-14 NOTE — Telephone Encounter (Signed)
PT called and was informed of Dr. Oneida Alar recommendations.

## 2015-03-20 ENCOUNTER — Other Ambulatory Visit: Payer: Self-pay

## 2015-03-20 DIAGNOSIS — R1033 Periumbilical pain: Secondary | ICD-10-CM

## 2015-03-20 DIAGNOSIS — R195 Other fecal abnormalities: Secondary | ICD-10-CM

## 2015-03-24 DIAGNOSIS — M9902 Segmental and somatic dysfunction of thoracic region: Secondary | ICD-10-CM | POA: Diagnosis not present

## 2015-03-24 DIAGNOSIS — M9901 Segmental and somatic dysfunction of cervical region: Secondary | ICD-10-CM | POA: Diagnosis not present

## 2015-03-24 DIAGNOSIS — M546 Pain in thoracic spine: Secondary | ICD-10-CM | POA: Diagnosis not present

## 2015-03-24 DIAGNOSIS — M542 Cervicalgia: Secondary | ICD-10-CM | POA: Diagnosis not present

## 2015-03-28 ENCOUNTER — Ambulatory Visit (INDEPENDENT_AMBULATORY_CARE_PROVIDER_SITE_OTHER): Payer: Medicare Other | Admitting: Allergy and Immunology

## 2015-03-28 ENCOUNTER — Encounter: Payer: Self-pay | Admitting: Allergy and Immunology

## 2015-03-28 VITALS — BP 128/70 | HR 80 | Temp 98.6°F | Resp 12

## 2015-03-28 DIAGNOSIS — R062 Wheezing: Secondary | ICD-10-CM

## 2015-03-28 DIAGNOSIS — R05 Cough: Secondary | ICD-10-CM

## 2015-03-28 DIAGNOSIS — R059 Cough, unspecified: Secondary | ICD-10-CM

## 2015-03-28 MED ORDER — IPRATROPIUM BROMIDE 0.02 % IN SOLN
0.5000 mg | Freq: Once | RESPIRATORY_TRACT | Status: DC
  Administered 2015-03-28: 0.5 mg via RESPIRATORY_TRACT

## 2015-03-28 MED ORDER — AZELASTINE HCL 0.15 % NA SOLN: NASAL | Status: DC

## 2015-03-28 MED ORDER — LEVALBUTEROL HCL 0.63 MG/3ML IN NEBU
0.6300 mg | INHALATION_SOLUTION | Freq: Once | RESPIRATORY_TRACT | Status: AC
  Administered 2015-03-28: 0.63 mg via RESPIRATORY_TRACT

## 2015-03-28 MED ORDER — MOMETASONE FUROATE 220 MCG/INH IN AEPB: 2.0000 | INHALATION_SPRAY | Freq: Two times a day (BID) | RESPIRATORY_TRACT | Status: DC

## 2015-03-28 MED ORDER — ALBUTEROL SULFATE HFA 108 (90 BASE) MCG/ACT IN AERS: 2.0000 | INHALATION_SPRAY | RESPIRATORY_TRACT | Status: DC | PRN

## 2015-03-28 MED ORDER — MONTELUKAST SODIUM 10 MG PO TABS: 10.0000 mg | ORAL_TABLET | Freq: Every day | ORAL | Status: DC

## 2015-03-28 NOTE — Patient Instructions (Addendum)
Take Home Sheet  1. Avoidance of significant temperature changes.   2. Antihistamine: Claritin 10mg  by mouth once daily for runny nose or itching.   3. Nasal Spray: Azelastine 1 spray(s) each nostril twice daily for stuffy nose or drainage.     STOP Flonsase  4. Inhalers:  Rescue: ProAir 2 puffs every 4 hours as needed for cough or wheeze.       -May use 2 puffs 10-20 minutes prior to exercise.   Preventative: Asmanex 4 puffs twice daily (Rinse, gargle, and spit out after use) for the next 7 days then decrease to 2 puffs twice daily.   6. Other: Continue Singulair 10mg  once daily.      May use Mucinex twice daily as needed.  7. Nasal Saline wash prior to Azelastine and as needed.  8. Follow up Visit: 3 months or sooner if needed.   Websites that have reliable Patient information: 1. American Academy of Asthma, Allergy, & Immunology: www.aaaai.org 2. Food Allergy Network: www.foodallergy.org 3. Mothers of Asthmatics: www.aanma.org 4. [place]: DiningCalendar.de 5. American College of Allergy, Asthma, & Immunology: https://robertson.info/ or www.acaai.org

## 2015-03-28 NOTE — Progress Notes (Signed)
FOLLOW UP NOTE  RE: Heather [name] MRN: [id number] DOB: [birth year] [facility] [address] [place],   [id number] Date of Office Visit: 03/28/2015  Subjective:  Heather [name] is a 39 y.o. female who presents today for Nasal Congestion; Breathing Problem; Wheezing; Cough; and Medication Refill  Assessment:   1.  History of mild asthma with recent cough, in no respiratory distress.   2.  Mixed rhinitis with recent increasing symptoms, inadequate relief with Flonase.   3.       Probable recent viral upper respiratory infection, afebrile in no respiratory distress. Plan:   Meds ordered this encounter  Medications  . levalbuterol (XOPENEX) nebulizer solution 0.63 mg    Sig:     2 vials administered in clinic  . albuterol (PROAIR HFA) 108 (90 BASE) MCG/ACT inhaler    Sig: Inhale 2 puffs into the lungs every 4 (four) hours as needed for wheezing or shortness of breath (cough).    Dispense:  1 Inhaler    Refill:  2  . mometasone (ASMANEX) 220 MCG/INH inhaler    Sig: Inhale 2 puffs into the lungs 2 (two) times daily.    Dispense:  1 Inhaler    Refill:  2  . montelukast (SINGULAIR) 10 MG tablet    Sig: Take 1 tablet (10 mg total) by mouth at bedtime.    Dispense:  34 tablet    Refill:  5  . Azelastine HCl 0.15 % SOLN    Sig: One spray in each nostril twice daily for stuffy nose or drainage.    Dispense:  30 mL    Refill:  5   Patient Instructions  1. Avoidance of significant temperature changes. 2. Antihistamine: Claritin 10mg  by mouth once daily for runny nose or itching. 3. Nasal Spray: Azelastine (as Patanase is non-formulary) 1 spray(s) each nostril twice daily for stuffy nose or drainage.   STOP Flonsase 4. Inhalers:  Rescue: ProAir 2 puffs every 4 hours as needed for cough or wheeze--to use over the next few days.       -May use 2 puffs 10-20 minutes prior to exercise.  Preventative: Asmanex 4 puffs twice daily (Rinse, gargle, and spit  out after use) for the next 7 days then decrease to 2 puffs twice daily. 6. Other: Continue Singulair 10mg  once daily.      May use Mucinex twice daily as needed.      Heather [name] will have on hold (4) 10mg  tablets of Prednisone and communicate with the office with further symptoms, to start as discussed. 7. Nasal Saline wash prior to Azelastine and as needed. 8. Follow up Visit: 3 months or sooner if needed and she is aware of our 24 hour on call physician availability.  HPI: Heather [name] returns to the office with recent congestion, rhinorrhea and postnasal drip over the last 5 days, intermittently associated cough and initially had sore throat with chest congestion which has resolved.  She typically describes now as her symptomatic season and wondered about a change of her nose spray, noting cough without wheeze, difficulty in breathing or shortness of breath.  Initially her symptoms were associated with sore throat and chest congestion, though she has not used any ProAir. Reports sleep and activity are normal.  However, since her last visit here in May, she had ED visit/hospitalization related to skin and associated infection--completed extended course of antibiotics.  She has followed up with her primary MD and reports 100% clearance.  Current Medications:  1.  Claritin 10 mg once daily. 2.  Flonase 1-2 sprays once daily. 3.  Singulair 10 mg once daily. 4.  Asmanex 220 g 2 puffs twice daily. 5.  ProAir HFA as needed--none recently. 6.  Daily Geodon, vitamin E, Requip, prenatal vitamin, potassium chloride, Protonix, Glucophage, Provera, Lasix, BuSpar and Wellbutrin. 7.  As needed Antivert, Bentyl, Zofran and Tylenol.  Drug Allergies: Allergies  Allergen Reactions  .    Marland Kitchen Nutritional Supplements Anaphylaxis  . Penicillins Anaphylaxis    REACTION: Angioedema  . Adhesive [Tape] Rash  . Latex Rash  . Vancomycin Other (See Comments)    Pt can tolerate Vancomycin but did cause Red-Man Syndrome.   Recommend to pre-medicate with Benadryl before doses administered.      Objective:   Filed Vitals:   03/28/15 1641  BP: 128/70  Pulse: 80  Temp: 98.6 F (37 C)  Resp: 12   SpO2 Readings from Last 1 Encounters:  03/28/15 99%   Physical Exam  Constitutional: She is well-developed, well-nourished, and in no distress.  Alert interactive communicating easily in full sentences , in no acute distress.  HENT:  Head: Atraumatic.  Right Ear: Tympanic membrane and ear canal normal.  Left Ear: Tympanic membrane and ear canal normal.  Nose: Mucosal edema present. No rhinorrhea. No epistaxis.  Mouth/Throat: Oropharynx is clear and moist and mucous membranes are normal. No oropharyngeal exudate, posterior oropharyngeal edema or posterior oropharyngeal erythema.  Neck: Neck supple.  Cardiovascular: Normal rate, S1 normal and S2 normal.   No murmur heard. Pulmonary/Chest: Effort normal. She has no wheezes. She has no rhonchi. She has no rales.  Post Xopenex/Atrovent: Continues to be clear without adventitious breath sounds, patient reports improved aeration.  Lymphadenopathy:    She has no cervical adenopathy.    Diagnostics: Spirometry FVC 2.41--69%, FEV1 1.94--71%, postbronchodilator essentially no change.      Roselyn M. Ishmael Holter, MD  cc: [name], MD

## 2015-03-30 ENCOUNTER — Telehealth: Payer: Self-pay

## 2015-03-30 ENCOUNTER — Other Ambulatory Visit: Payer: Self-pay | Admitting: Allergy and Immunology

## 2015-03-30 NOTE — Telephone Encounter (Signed)
Patient Called and didn't realize the pharmacy has the prescription under her married name. The Meds are at [facility].  Thanks

## 2015-03-30 NOTE — Telephone Encounter (Signed)
Patient was seen on Tuesday in [place]. She called to let Dr. Ishmael Holter know she is doing much better. Also patients meds were not at the pharmacy when she went to pick them up.  Please Advise  [facility] [place]

## 2015-03-31 ENCOUNTER — Other Ambulatory Visit: Payer: Self-pay | Admitting: Allergy and Immunology

## 2015-04-04 ENCOUNTER — Other Ambulatory Visit: Payer: Self-pay | Admitting: *Deleted

## 2015-04-04 MED ORDER — MONTELUKAST SODIUM 10 MG PO TABS: 10.0000 mg | ORAL_TABLET | Freq: Every day | ORAL | Status: DC

## 2015-04-11 DIAGNOSIS — F3341 Major depressive disorder, recurrent, in partial remission: Secondary | ICD-10-CM | POA: Diagnosis not present

## 2015-04-11 DIAGNOSIS — R7301 Impaired fasting glucose: Secondary | ICD-10-CM | POA: Diagnosis not present

## 2015-04-20 ENCOUNTER — Encounter: Payer: Self-pay | Admitting: Nutrition

## 2015-04-20 ENCOUNTER — Encounter: Payer: Medicare Other | Attending: Family Medicine | Admitting: Nutrition

## 2015-04-20 DIAGNOSIS — E739 Lactose intolerance, unspecified: Secondary | ICD-10-CM | POA: Diagnosis not present

## 2015-04-20 DIAGNOSIS — Z6841 Body Mass Index (BMI) 40.0 and over, adult: Secondary | ICD-10-CM | POA: Insufficient documentation

## 2015-04-20 DIAGNOSIS — Z713 Dietary counseling and surveillance: Secondary | ICD-10-CM | POA: Insufficient documentation

## 2015-04-20 NOTE — Progress Notes (Signed)
  Medical Nutrition Therapy:  Appt start time: 1400 end time:  1430.  Assessment:  Primary concerns today: Obesity.   CHanges made: Cut back on portions. No sweets, Drinking more water and eating more fresh fruits and vegetables.   Prediabetes A1C 5.7%.   Preferred Learning Style:   Auditory  Visual  Hands on  Learning Readiness:   Ready  Change in progress   MEDICATIONS: See list   DIETARY INTAKE: 24-hr recall:  B ( AM): Egg, fruit, water Snk ( AM):  L ( PM): Toss salad with [place] dressing, water Snk ( PM): none D ( PM):  Toss salad same as lunch.  Snk ( PM): none Beverages: water.  Usual physical activity: walking some  Estimated energy needs: 1500 calories 170 g carbohydrates 112 g protein 42 g fat  Progress Towards Goal(s):  In progress.   Nutritional Diagnosis:  NB-1.1 Food and nutrition-related knowledge deficit As related to Severe Obesity.  As evidenced by BMI >70.Marland Kitchen    Intervention:  Nutrition counseling and DM education on My Plate, portion sizes, CHO counting, balanced meals, meal planning, benefits of exericise and walking for needed weight loss, slowing down on eating, emotional eating, low fat low sodium high fiber diet and healthy weight loss tips.  Goals 1. Follow My Plate 2. Increase more fresh fruits and vegetables. Eat three pieces of fruit a day. Choose more brussel sprouts, greens, broccoli, and high fiber vegetables. 3. Incorporate more exercise to  30 minutes a day. 4. Drinking water only. 5. Lose 1-2 lbs lb per week. 6. Change to a vinaigrette dressing instead of [facility].   Teaching Method Utilized:  Visual Auditory Hands on  Handouts given during visit include:  The Plate Method  Meal Plan Card  Diabetes Instructions  Barriers to learning/adherence to lifestyle change: Severe obesity  Demonstrated degree of understanding via:  Teach Back   Monitoring/Evaluation:  Dietary intake, exercise, meal  planning, and body weight in 2 month(s).

## 2015-04-20 NOTE — Patient Instructions (Signed)
Goals 1. Follow My Plate 2. Increase more fresh fruits and vegetables. Eat three pieces a day. Choose more brussel sprouts, greens, broccoli, and high fiber vegetables. 3. Incorporate more exercise to  30 minutes a day. 4. Drinking water only. 5. Lose 1-2 lbs lb per week. 6. Change to a vinaigrette dressing instead of [facility].

## 2015-04-26 ENCOUNTER — Emergency Department (HOSPITAL_COMMUNITY): Payer: Medicare Other

## 2015-04-26 ENCOUNTER — Encounter (HOSPITAL_COMMUNITY): Payer: Self-pay | Admitting: Emergency Medicine

## 2015-04-26 ENCOUNTER — Emergency Department (HOSPITAL_COMMUNITY)
Admission: EM | Admit: 2015-04-26 | Discharge: 2015-04-26 | Disposition: A | Discharge status: Home / Self Care | Payer: Medicare Other | Attending: Emergency Medicine | Admitting: Emergency Medicine

## 2015-04-26 DIAGNOSIS — Z91040 Latex allergy status: Secondary | ICD-10-CM | POA: Diagnosis not present

## 2015-04-26 DIAGNOSIS — F419 Anxiety disorder, unspecified: Secondary | ICD-10-CM | POA: Diagnosis not present

## 2015-04-26 DIAGNOSIS — Z7984 Long term (current) use of oral hypoglycemic drugs: Secondary | ICD-10-CM | POA: Insufficient documentation

## 2015-04-26 DIAGNOSIS — K219 Gastro-esophageal reflux disease without esophagitis: Secondary | ICD-10-CM | POA: Diagnosis not present

## 2015-04-26 DIAGNOSIS — Z7951 Long term (current) use of inhaled steroids: Secondary | ICD-10-CM | POA: Diagnosis not present

## 2015-04-26 DIAGNOSIS — Z880 Allergy status to penicillin: Secondary | ICD-10-CM | POA: Diagnosis not present

## 2015-04-26 DIAGNOSIS — F250 Schizoaffective disorder, bipolar type: Secondary | ICD-10-CM | POA: Diagnosis not present

## 2015-04-26 DIAGNOSIS — E119 Type 2 diabetes mellitus without complications: Secondary | ICD-10-CM | POA: Diagnosis not present

## 2015-04-26 DIAGNOSIS — F329 Major depressive disorder, single episode, unspecified: Secondary | ICD-10-CM | POA: Insufficient documentation

## 2015-04-26 DIAGNOSIS — G4730 Sleep apnea, unspecified: Secondary | ICD-10-CM | POA: Insufficient documentation

## 2015-04-26 DIAGNOSIS — R05 Cough: Secondary | ICD-10-CM | POA: Diagnosis present

## 2015-04-26 DIAGNOSIS — J40 Bronchitis, not specified as acute or chronic: Secondary | ICD-10-CM

## 2015-04-26 DIAGNOSIS — Z8742 Personal history of other diseases of the female genital tract: Secondary | ICD-10-CM | POA: Diagnosis not present

## 2015-04-26 DIAGNOSIS — Z8679 Personal history of other diseases of the circulatory system: Secondary | ICD-10-CM | POA: Insufficient documentation

## 2015-04-26 DIAGNOSIS — R0602 Shortness of breath: Secondary | ICD-10-CM | POA: Diagnosis not present

## 2015-04-26 DIAGNOSIS — J209 Acute bronchitis, unspecified: Secondary | ICD-10-CM | POA: Diagnosis not present

## 2015-04-26 DIAGNOSIS — Z79899 Other long term (current) drug therapy: Secondary | ICD-10-CM | POA: Diagnosis not present

## 2015-04-26 DIAGNOSIS — J449 Chronic obstructive pulmonary disease, unspecified: Secondary | ICD-10-CM | POA: Insufficient documentation

## 2015-04-26 DIAGNOSIS — Z87891 Personal history of nicotine dependence: Secondary | ICD-10-CM | POA: Diagnosis not present

## 2015-04-26 MED ORDER — AZITHROMYCIN 250 MG PO TABS: ORAL_TABLET | ORAL | Status: DC

## 2015-04-26 MED ORDER — PREDNISONE 20 MG PO TABS: 40.0000 mg | ORAL_TABLET | Freq: Every day | ORAL | Status: DC

## 2015-04-26 MED ORDER — IPRATROPIUM-ALBUTEROL 0.5-2.5 (3) MG/3ML IN SOLN
3.0000 mL | RESPIRATORY_TRACT | Status: DC
  Administered 2015-04-26: 3 mL via RESPIRATORY_TRACT
  Filled 2015-04-26: qty 3

## 2015-04-26 MED ORDER — AZITHROMYCIN 250 MG PO TABS
500.0000 mg | ORAL_TABLET | Freq: Once | ORAL | Status: AC
  Administered 2015-04-26: 500 mg via ORAL
  Filled 2015-04-26: qty 2

## 2015-04-26 MED ORDER — PREDNISONE 20 MG PO TABS
40.0000 mg | ORAL_TABLET | Freq: Once | ORAL | Status: AC
  Administered 2015-04-26: 40 mg via ORAL
  Filled 2015-04-26: qty 2

## 2015-04-26 NOTE — ED Notes (Signed)
Pt c/o productive cough with yellow/green sputum x 1 week. Pt also reports chills/body aches/diarrhea. nad noted.

## 2015-04-26 NOTE — ED Provider Notes (Signed)
CSN: [id number]     Arrival date & time 04/26/15  1217 History   First MD Initiated Contact with Patient 04/26/15 1226     Chief Complaint  Patient presents with  . Cough     (Consider location/radiation/quality/duration/timing/severity/associated sxs/prior Treatment) Patient is a 40 y.o. female presenting with cough.  Cough Cough characteristics:  Productive Sputum characteristics:  Green Onset quality:  Gradual Duration:  2 weeks Timing:  Constant Progression:  Worsening Chronicity:  New Smoker: no   Ineffective treatments:  Beta-agonist inhaler Associated symptoms: no chills, no fever, no headaches and no sore throat     Past Medical History  Diagnosis Date  . Amenorrhea   . Dysmenorrhea   . COPD (chronic obstructive pulmonary disease) ([place])   . Dysrhythmia     DR Johnsie Cancel    . Asthma   . Sleep apnea     CPAP  . Shortness of breath     WITH EXERTION   . GERD (gastroesophageal reflux disease)     HEARTBURN   TUMS  . Neuromuscular disorder (HCC)     RESTLESS LEG   . Depression   . Anxiety   . Obesity   . Morbid obesity ([place])   . Ectopic pregnancy 2013  . Eosinophilic esophagitis     Diagnosed at [facility] 06/16/2013, untreated  . Depression   . Anxiety   . Schizoaffective disorder, bipolar type ([place])   . Diabetes mellitus without complication [facility])    Past Surgical History  Procedure Laterality Date  . Cholecystectomy    . Tonsillectomy    . Tooth extraction  10/28/2011    Procedure: DENTAL RESTORATION/EXTRACTIONS;  Surgeon: Gae Bon, DDS;  Location: [place];  Service: Oral Surgery;;  . Dental surgery    . Esophagogastroduodenoscopy  May 2007    Dr. Gala Romney: Normal esophagus, stomach, D1, D2  . Esophagogastroduodenoscopy  06/16/2013    Dr. Carlton Adam, eosinophilic esophagitis, reactive gastropathy, no esophageal dilation   Family History  Problem Relation Age of Onset  . Depression Mother   . Hypertension Sister   . Diabetes Paternal Uncle   . Anxiety  disorder Paternal Uncle   . Colon polyps Maternal Grandmother     9s  . Liver disease Neg Hx   . Crohn's disease Maternal Aunt    Social History  Substance Use Topics  . Smoking status: Former Smoker -- 0.50 packs/day for 8 years    Types: Cigarettes    Quit date: 04/25/2011  . Smokeless tobacco: Never Used  . Alcohol Use: No     Comment: occasional    OB History    Gravida Para Term Preterm AB TAB SAB Ectopic Multiple Living   1    1  1         Review of Systems  Constitutional: Negative for fever and chills.  HENT: Negative for sore throat.   Respiratory: Positive for cough.   Neurological: Negative for headaches.  All other systems reviewed and are negative.     Allergies  Bee venom; Nutritional supplements; Penicillins; Adhesive; Latex; and Vancomycin  Home Medications   Prior to Admission medications   Medication Sig Start Date End Date Taking? Authorizing Provider  acetaminophen (TYLENOL) 500 MG tablet Take 1,000 mg by mouth every 6 (six) hours as needed. pain    Historical Provider, MD  albuterol (PROAIR HFA) 108 (90 BASE) MCG/ACT inhaler Inhale 2 puffs into the lungs every 4 (four) hours as needed for wheezing or shortness of breath (cough). 03/28/15  Roselyn Malachy Moan, MD  albuterol (PROVENTIL HFA;VENTOLIN HFA) 108 (90 BASE) MCG/ACT inhaler Inhale 1-2 puffs into the lungs every 6 (six) hours as needed for wheezing. 11/15/12   Hope Bunnie Pion, NP  Azelastine HCl 0.15 % SOLN One spray in each nostril twice daily for stuffy nose or drainage. 03/28/15   Roselyn Malachy Moan, MD  buPROPion (WELLBUTRIN XL) 300 MG 24 hr tablet Take 300 mg by mouth daily. 04/28/14   Historical Provider, MD  busPIRone (BUSPAR) 10 MG tablet Take 15 mg by mouth 2 (two) times daily. For anxiety    Historical Provider, MD  dicyclomine (BENTYL) 20 MG tablet Take 1 tablet (20 mg total) by mouth 3 (three) times daily before meals. As needed for abdominal cramps or diarrhea Patient taking differently: Take  20 mg by mouth as needed. As needed for abdominal cramps or diarrhea 08/25/14   Mahala Menghini, PA-C  fluticasone [facility]) 50 MCG/ACT nasal spray INHALE 1 TO 2 SPRAYS IN EACH NOSTRIL DAILY 02/20/15   Roselyn Malachy Moan, MD  FLUVIRIN SUSP Inject 1 each as directed once. 01/05/15   Historical Provider, MD  furosemide (LASIX) 40 MG tablet Take 40 mg by mouth daily.  06/10/14   Historical Provider, MD  loratadine (CLARITIN) 10 MG tablet TAKE 1 TABLET BY MOUTH EVERY DAY FOR RUNNY NOSE 03/30/15   Roselyn Malachy Moan, MD  meclizine (ANTIVERT) 25 MG tablet Take 25 mg by mouth as needed. 12/28/14   Historical Provider, MD  medroxyPROGESTERone (PROVERA) 10 MG tablet Take 2 tablets by mouth daily. For 5 days 02/28/15   Historical Provider, MD  metFORMIN (GLUCOPHAGE) 500 MG tablet Take 500 mg by mouth daily with breakfast.    Historical Provider, MD  mometasone (ASMANEX) 220 MCG/INH inhaler Inhale 2 puffs into the lungs 2 (two) times daily. 03/28/15   Roselyn Malachy Moan, MD  montelukast (SINGULAIR) 10 MG tablet Take 1 tablet (10 mg total) by mouth at bedtime. 04/04/15   Roselyn Malachy Moan, MD  ondansetron (ZOFRAN-ODT) 8 MG disintegrating tablet Take 1 tablet by mouth daily as needed. 02/28/15   Historical Provider, MD  pantoprazole (PROTONIX) 40 MG tablet TAKE ONE TABLET BY MOUTH DAILY 11/03/14   Mahala Menghini, PA-C  potassium chloride (MICRO-K) 10 MEQ CR capsule Take 10 mEq by mouth 2 (two) times daily.    Historical Provider, MD  Prenatal Vit-Fe Fumarate-FA (PRENATAL MULTIVITAMIN) TABS Take 1 tablet by mouth daily.     Historical Provider, MD  rOPINIRole (REQUIP) 0.5 MG tablet Take 1 mg by mouth at bedtime.  07/04/14   Historical Provider, MD  vitamin E 400 UNIT capsule Take 400 Units by mouth daily.    Historical Provider, MD  ziprasidone (GEODON) 60 MG capsule Take 60 mg by mouth every evening.     Historical Provider, MD   BP 138/79 mmHg  Pulse 92  Temp(Src) 98 F (36.7 C) (Oral)  Resp 20  Ht 5\' 3"  (1.6 m)  Wt 434 lb  (196.861 kg)  BMI 76.90 kg/m2  SpO2 100%  LMP  (Within Weeks) Physical Exam  Constitutional: She is oriented to person, place, and time. She appears well-developed and well-nourished.  HENT:  Head: Normocephalic and atraumatic.  Neck: Normal range of motion.  Cardiovascular: Normal rate and regular rhythm.   Pulmonary/Chest: Effort normal and breath sounds normal. No stridor. No respiratory distress. She exhibits no tenderness.  Abdominal: Soft. She exhibits no distension. There is no tenderness.  Musculoskeletal: Normal range of motion. She exhibits  no edema or tenderness.  Neurological: She is alert and oriented to person, place, and time.  Skin: Skin is warm and dry.  Nursing note and vitals reviewed.   ED Course  Procedures (including critical care time) Labs Review Labs Reviewed - No data to display  Imaging Review Dg Chest 2 View  04/26/2015  CLINICAL DATA:  Productive cough and congestion for 2 weeks. Wheezing and shortness of breath. EXAM: CHEST - 2 VIEW COMPARISON:  11/15/2012 FINDINGS: The heart size and mediastinal contours are within normal limits. There is no evidence of pulmonary edema, consolidation, pneumothorax, nodule or pleural fluid. The visualized skeletal structures are unremarkable. IMPRESSION: No active disease. Electronically Signed   By: Aletta Edouard M.D.   On: 04/26/2015 13:26   I have personally reviewed and evaluated these images and lab results as part of my medical decision-making.   EKG Interpretation   Date/Time:  Wednesday April 26 2015 12:44:42 EST Ventricular Rate:  94 PR Interval:  162 QRS Duration: 125 QT Interval:  366 QTC Calculation: 458 R Axis:   55 Text Interpretation:  Sinus rhythm Consider right atrial enlargement IVCD,  consider atypical RBBB Baseline wander in lead(s) V6 No significant change  since last tracing on 04/21/12 Confirmed by [facility] MD, Corene Cornea [id number]) on  04/26/2015 1:30:19 PM      MDM   Final diagnoses:   Bronchitis   Likely bronchitis. Will cxr to ensure no signficant pneumonia, otherwise will start on z pack, prednisone and continue albuterol treatments at home.  cxr ok. ecg ok compared to previous. Doubt CHF, PNA or other acute causes. Stable for dc and pcp follow up.    Merrily Pew, MD 04/26/15 [phone]

## 2015-04-26 NOTE — ED Notes (Signed)
Pt reports cough, congestion, difficulty breathing.  Pt coughing up green sputum.  Pt having chills, diarrhea.  Pt alert and oriented.

## 2015-05-02 DIAGNOSIS — R7301 Impaired fasting glucose: Secondary | ICD-10-CM | POA: Diagnosis not present

## 2015-05-02 DIAGNOSIS — F3341 Major depressive disorder, recurrent, in partial remission: Secondary | ICD-10-CM | POA: Diagnosis not present

## 2015-05-09 DIAGNOSIS — F332 Major depressive disorder, recurrent severe without psychotic features: Secondary | ICD-10-CM | POA: Diagnosis not present

## 2015-05-16 DIAGNOSIS — G2581 Restless legs syndrome: Secondary | ICD-10-CM | POA: Diagnosis not present

## 2015-05-16 DIAGNOSIS — F3341 Major depressive disorder, recurrent, in partial remission: Secondary | ICD-10-CM | POA: Diagnosis not present

## 2015-05-16 DIAGNOSIS — E668 Other obesity: Secondary | ICD-10-CM | POA: Diagnosis not present

## 2015-05-20 NOTE — Progress Notes (Signed)
REVIEWED-NO ADDITIONAL RECOMMENDATIONS. 

## 2015-05-30 ENCOUNTER — Ambulatory Visit (INDEPENDENT_AMBULATORY_CARE_PROVIDER_SITE_OTHER): Payer: Medicare Other | Admitting: Allergy and Immunology

## 2015-05-30 ENCOUNTER — Encounter: Payer: Self-pay | Admitting: Allergy and Immunology

## 2015-05-30 VITALS — BP 116/60 | HR 74 | Temp 98.1°F | Resp 16

## 2015-05-30 DIAGNOSIS — J310 Chronic rhinitis: Secondary | ICD-10-CM | POA: Diagnosis not present

## 2015-05-30 DIAGNOSIS — R05 Cough: Secondary | ICD-10-CM | POA: Diagnosis not present

## 2015-05-30 DIAGNOSIS — R062 Wheezing: Secondary | ICD-10-CM

## 2015-05-30 DIAGNOSIS — R059 Cough, unspecified: Secondary | ICD-10-CM

## 2015-05-30 MED ORDER — AZELASTINE HCL 0.15 % NA SOLN: 1.0000 | Freq: Two times a day (BID) | NASAL | Status: DC | PRN

## 2015-05-30 NOTE — Progress Notes (Addendum)
FOLLOW UP NOTE  RE: Heather [name] MRN: [id number] DOB: 03-Aug-1975 [facility] [address], [place] [address] Date of Office Visit: 05/30/2015  Subjective:  Heather [name] is a 40 y.o. female who presents today for Medication Management  Assessment:   1. History of asthma, mild persistent appears well controlled.  2. Mixed rhinitis, reported as asymptomatic.  3. Question of local bee sting reaction--vague historian--previous report of EpiPen prescription.   4.      Complex medical history, with multiple medication regime. Plan:  No orders of the defined types were placed in this encounter.   Patient Instructions  1.  Continue current medication regime. 2.  Saline nasal wash as needed. 3.  Add Flonase 1-2 sprays as needed for nasal congestion. 4.  If persisting congestion use Azelastine 1-2 once to twice daily.   5.  Obtain records from ED visit related to wasp sting and assess need for further evaluation.   6.  Follow-up in 6 months or sooner if needed.  Otherwise by phone after record evaluation.  HPI: Heather [name] returns to the office in follow-up of asthma and mixed rhinitis.  Since her last visit in December she completed the Prednisone but had recurring symptoms and decided to go to the ED, who prescribed antibiotics and additional Prednisone.  She reports improved 100%, and also decided not to continue any further nasal spray given how well she was doing.  She also noted that the Azelastine was supposed to cost $150, she is unsure why therefore has not been using.  She is pleased with how well she is doing denies cough, wheeze, congestion, postnasal drip, headache, or other recurring concerns.  She states she feels great.  Denies urgent care visits.  Reports sleep and activity are normal.  No ProAir since early January and no further concerns.  In review of history, approximately 2013, Heather [name] recalls a sting to the foot with local swelling  with a question of shortness of breath--possibly a wasp which prompted an ED visit (no generalized hives, rash, itching, difficulty in breathing, cough, wheeze or other symptoms.)  She reports receiving an injection at that time and currently has an up-to-date EpiPen (her only other sting was as a child only foot swelling).   Heather [name] has a current medication list which includes the following prescription(s): acetaminophen, albuterol, bupropion, buspirone, dicyclomine, diphenhydramine, Epi-pen, loratadine, medroxyprogesterone, metformin, mometasone, montelukast, pantoprazole, prenatal multivitamin, ropinirole, vitamin e, and ziprasidone.  Drug Allergies: Allergies  Allergen Reactions  . Penicillins Anaphylaxis  . Adhesive [Tape] Rash  . Latex Rash  . Vancomycin Other (See Comments)    Pt can tolerate Vancomycin but did cause Red-Man Syndrome.  Recommend to pre-medicate with Benadryl before doses administered.     Objective:   Filed Vitals:   05/30/15 1031  BP: 116/60  Pulse: 74  Temp: 98.1 F (36.7 C)  Resp: 16   Physical Exam  Constitutional:  Alert, oriented, communicating easily in full sentences , obese very pleasant female in no acute distress.  HENT:  Head: Atraumatic.  Right Ear: Tympanic membrane and ear canal normal.  Left Ear: Tympanic membrane and ear canal normal.  Nose: Mucosal edema present. No rhinorrhea. No epistaxis.  Mouth/Throat: Oropharynx is clear and moist and mucous membranes are normal. No oropharyngeal exudate, posterior oropharyngeal edema or posterior oropharyngeal erythema.  Neck: Neck supple.  Cardiovascular: Normal rate, S1 normal and S2 normal.   No murmur heard. Pulmonary/Chest: Effort normal.  She has no wheezes. She has no rhonchi. She has no rales.  Lymphadenopathy:    She has no cervical adenopathy.   Diagnostics: Spirometry:  FVC 2.80--87%, FEV1 1.99-71%.    Roselyn M. Ishmael Holter, MD  cc: [name], MD

## 2015-05-30 NOTE — Patient Instructions (Addendum)
   Continue current medication regime.  Saline nasal wash as needed.  Add Flonase 1-2 sprays as needed for nasal congestion.  If persisting congestion use Azelastine 1-2 once to twice daily.    Follow-up in 6 months or sooner if needed.

## 2015-06-19 DIAGNOSIS — M9901 Segmental and somatic dysfunction of cervical region: Secondary | ICD-10-CM | POA: Diagnosis not present

## 2015-06-19 DIAGNOSIS — M9902 Segmental and somatic dysfunction of thoracic region: Secondary | ICD-10-CM | POA: Diagnosis not present

## 2015-06-19 DIAGNOSIS — M542 Cervicalgia: Secondary | ICD-10-CM | POA: Diagnosis not present

## 2015-06-19 DIAGNOSIS — M546 Pain in thoracic spine: Secondary | ICD-10-CM | POA: Diagnosis not present

## 2015-06-22 DIAGNOSIS — M546 Pain in thoracic spine: Secondary | ICD-10-CM | POA: Diagnosis not present

## 2015-06-22 DIAGNOSIS — M9902 Segmental and somatic dysfunction of thoracic region: Secondary | ICD-10-CM | POA: Diagnosis not present

## 2015-06-22 DIAGNOSIS — M9901 Segmental and somatic dysfunction of cervical region: Secondary | ICD-10-CM | POA: Diagnosis not present

## 2015-06-22 DIAGNOSIS — M542 Cervicalgia: Secondary | ICD-10-CM | POA: Diagnosis not present

## 2015-06-28 ENCOUNTER — Encounter: Payer: Medicare Other | Attending: Family Medicine | Admitting: Nutrition

## 2015-06-28 ENCOUNTER — Ambulatory Visit: Payer: Self-pay | Admitting: Nutrition

## 2015-06-28 DIAGNOSIS — E739 Lactose intolerance, unspecified: Secondary | ICD-10-CM | POA: Diagnosis not present

## 2015-06-28 DIAGNOSIS — Z713 Dietary counseling and surveillance: Secondary | ICD-10-CM | POA: Diagnosis not present

## 2015-06-28 DIAGNOSIS — Z6841 Body Mass Index (BMI) 40.0 and over, adult: Secondary | ICD-10-CM | POA: Diagnosis not present

## 2015-06-28 NOTE — Patient Instructions (Signed)
Goals 1. Follow My Plate 2. Use Kix cereal or oatmeal or plain cheerios instead of honey nut cheerios. 3. Increase low carb vegetables 2 per meal 4. Cut out popcorn 5. Eat 3 piece of fruits per day. 6. Increasing the walking 7. Lose 1 lb per week  Or 5 lbs in the next month

## 2015-06-28 NOTE — Progress Notes (Signed)
  Medical Nutrition Therapy:  Appt start time: 1400 end time:  1430.  Assessment:  Primary concerns today: Obesity.  Lost 8 lbs since last visit. CHanges made: Cut back on portions. , Drinking more water and has cut out all tea and soft drinks.  Has been doing a few squats and has been walking and doing steps instead of the elevator. Has been eating more fresh fruits and vegetables. Has switched to eating on a smaller plate. Making good progress. Feels better. Able to ambulate and do a few exercises now. Wants to start walking more with nice weather. No recent A1C .   Prediabetes A1C 5.7%.   Preferred Learning Style:   Auditory  Visual  Hands on  Learning Readiness:   Ready  Change in progress   MEDICATIONS: See list   DIETARY INTAKE: 24-hr recall:  B ( AM): HOney nut cherrios with 1% milk; Snk ( AM):  L ( PM): Typically a sandwich and fruit, water Snk ( PM): none D ( PM): Spaghetti and water.  Snk ( PM): none Beverages: water.  Usual physical activity: walking some  Estimated energy needs: 1500 calories 170 g carbohydrates 112 g protein 42 g fat  Progress Towards Goal(s):  In progress.   Nutritional Diagnosis:  NB-1.1 Food and nutrition-related knowledge deficit As related to Severe Obesity.  As evidenced by BMI >70.Marland Kitchen    Intervention:  Nutrition counseling and DM education on My Plate, portion sizes, CHO counting, balanced meals, meal planning, benefits of exericise and walking for needed weight loss, slowing down on eating, emotional eating, low fat low sodium high fiber diet and healthy weight loss tips.  Goals 1. Follow My Plate 2. Use Kix cereal or oatmeal or plain cheerios instead of honey nut cheerios. 3. Increase low carb vegetables 2 per meal 4. Cut out popcorn 5. Eat 3 piece of fruits per day. 6. Increasing the walking 7. Lose 1 lb per week  Or 5 lbs in the next month   Teaching Method Utilized:  Visual Auditory Hands on  Handouts given during  visit include:  The Plate Method  Meal Plan Card  Diabetes Instructions  Barriers to learning/adherence to lifestyle change: Severe obesity  Demonstrated degree of understanding via:  Teach Back   Monitoring/Evaluation:  Dietary intake, exercise, meal planning, and body weight in 2 month(s).

## 2015-07-05 ENCOUNTER — Encounter (HOSPITAL_COMMUNITY): Payer: Self-pay | Admitting: Hematology & Oncology

## 2015-07-05 ENCOUNTER — Encounter (HOSPITAL_COMMUNITY): Payer: Medicare Other

## 2015-07-05 ENCOUNTER — Encounter (HOSPITAL_COMMUNITY): Payer: Medicare Other | Attending: Hematology & Oncology | Admitting: Hematology & Oncology

## 2015-07-05 VITALS — BP 147/71 | HR 85 | Temp 97.7°F | Resp 20 | Wt >= 6400 oz

## 2015-07-05 DIAGNOSIS — K200 Eosinophilic esophagitis: Secondary | ICD-10-CM

## 2015-07-05 DIAGNOSIS — Z87891 Personal history of nicotine dependence: Secondary | ICD-10-CM | POA: Diagnosis not present

## 2015-07-05 DIAGNOSIS — D7589 Other specified diseases of blood and blood-forming organs: Secondary | ICD-10-CM | POA: Diagnosis not present

## 2015-07-05 DIAGNOSIS — J4530 Mild persistent asthma, uncomplicated: Secondary | ICD-10-CM | POA: Insufficient documentation

## 2015-07-05 DIAGNOSIS — J45909 Unspecified asthma, uncomplicated: Secondary | ICD-10-CM | POA: Insufficient documentation

## 2015-07-05 DIAGNOSIS — E119 Type 2 diabetes mellitus without complications: Secondary | ICD-10-CM | POA: Diagnosis not present

## 2015-07-05 DIAGNOSIS — K219 Gastro-esophageal reflux disease without esophagitis: Secondary | ICD-10-CM | POA: Diagnosis not present

## 2015-07-05 DIAGNOSIS — J310 Chronic rhinitis: Secondary | ICD-10-CM | POA: Insufficient documentation

## 2015-07-05 DIAGNOSIS — Z7984 Long term (current) use of oral hypoglycemic drugs: Secondary | ICD-10-CM | POA: Diagnosis not present

## 2015-07-05 DIAGNOSIS — N946 Dysmenorrhea, unspecified: Secondary | ICD-10-CM | POA: Diagnosis not present

## 2015-07-05 DIAGNOSIS — Z9049 Acquired absence of other specified parts of digestive tract: Secondary | ICD-10-CM | POA: Diagnosis not present

## 2015-07-05 DIAGNOSIS — D72829 Elevated white blood cell count, unspecified: Secondary | ICD-10-CM

## 2015-07-05 DIAGNOSIS — Z79899 Other long term (current) drug therapy: Secondary | ICD-10-CM | POA: Diagnosis not present

## 2015-07-05 DIAGNOSIS — Z9889 Other specified postprocedural states: Secondary | ICD-10-CM | POA: Insufficient documentation

## 2015-07-05 DIAGNOSIS — J449 Chronic obstructive pulmonary disease, unspecified: Secondary | ICD-10-CM | POA: Insufficient documentation

## 2015-07-05 LAB — CBC WITH DIFFERENTIAL/PLATELET
BASOS ABS: 0 10*3/uL (ref 0.0–0.1)
BASOS PCT: 0 %
Eosinophils Absolute: 0.4 10*3/uL (ref 0.0–0.7)
Eosinophils Relative: 3 %
HEMATOCRIT: 39.9 % (ref 36.0–46.0)
HEMOGLOBIN: 12.7 g/dL (ref 12.0–15.0)
Lymphocytes Relative: 21 %
Lymphs Abs: 2.7 10*3/uL (ref 0.7–4.0)
MCH: 27.4 pg (ref 26.0–34.0)
MCHC: 31.8 g/dL (ref 30.0–36.0)
MCV: 86.2 fL (ref 78.0–100.0)
Monocytes Absolute: 1 10*3/uL (ref 0.1–1.0)
Monocytes Relative: 8 %
NEUTROS ABS: 8.8 10*3/uL — AB (ref 1.7–7.7)
NEUTROS PCT: 68 %
Platelets: 375 10*3/uL (ref 150–400)
RBC: 4.63 MIL/uL (ref 3.87–5.11)
RDW: 15.9 % — AB (ref 11.5–15.5)
WBC: 12.8 10*3/uL — ABNORMAL HIGH (ref 4.0–10.5)

## 2015-07-05 NOTE — Patient Instructions (Signed)
[place] at [facility] Discharge Instructions  RECOMMENDATIONS MADE BY THE CONSULTANT AND ANY TEST RESULTS WILL BE SENT TO YOUR REFERRING PHYSICIAN.   Exam and discussion by Dr Whitney Muse today WBC counts was 12.8 otherwise labs were stable  Return to see the doctor in 6 months with labs work  Please call the clinic if you have any questions or concerns    Thank you for choosing [place] at [facility] to provide your oncology and hematology care.  To afford each patient quality time with our provider, please arrive at least 15 minutes before your scheduled appointment time.   Beginning January 23rd 2017 lab work for the [facility] will be done in the  Main lab at [facility] on 1st floor. If you have a lab appointment with the [facility] please come in thru the  Main Entrance and check in at the main information desk  You need to re-schedule your appointment should you arrive 10 or more minutes late.  We strive to give you quality time with our providers, and arriving late affects you and other patients whose appointments are after yours.  Also, if you no show three or more times for appointments you may be dismissed from the clinic at the providers discretion.     Again, thank you for choosing [facility].  Our hope is that these requests will decrease the amount of time that you wait before being seen by our physicians.       _____________________________________________________________  Should you have questions after your visit to [facility], please contact our office at [phone] between the hours of 8:30 a.m. and 4:30 p.m.  Voicemails left after 4:30 p.m. will not be returned until the following business day.  For prescription refill requests, have your pharmacy contact our office.         Resources For Cancer Patients and their Caregivers ? American Cancer Society: Can assist with  transportation, wigs, general needs, runs Look Good Feel Better.        678-512-5531 ? Cancer Care: Provides financial assistance, online support groups, medication/co-pay assistance.  1-800-813-HOPE 256 600 6082) ? [facility] Assists [place] cancer patients and their families through emotional , educational and financial support.  (216)360-5049 ? [place] DSS Where to apply for food stamps, Medicaid and utility assistance. 701-152-1512 ? [facility]: Transportation to medical appointments. (930) 638-2868 ? Social Security Administration: May apply for disability if have a Stage IV cancer. (251)459-2308 639-796-1765 ? [employer], Disability and Transit Services: Assists with nutrition, care and transit needs. 907 151 3600

## 2015-07-05 NOTE — Progress Notes (Signed)
[facility] PROGRESS NOTE  Patient Care Team: Lucianne Lei, MD as PCP - General (Family Medicine) Danie Binder, MD as Consulting Physician (Gastroenterology)  CHIEF COMPLAINTS/PURPOSE OF CONSULTATION:  Leukocytosis History of asthma, mild persistent Rhinitis   HISTORY OF PRESENTING ILLNESS:  Heather [name] 40 y.o. female is here because of leukocytosis.  She has a history of eosinophilic esophagitis and reactive gastropathy diagnosed at [facility] in 2015. Review of her records in EPIC thosed a persistently mild elevation in her total WBC dating back several years ago. She has also had a mild intermittent elevation in her platelets into the low 400 K range.  07/28/2011  Total wbc of 11.2  08/21/2011  Total wbc of 13.3 02/18/2013  Total wbc of 13.9 with a mild neutrophilia, remainder of differential is unremarkable 01/25/2014  Total wbc of 12.4 with a mild neutrophilia, remainder of differential is unremarkable.  Heather [name] was here alone today.  She was sick for 2 weeks and she said it took a while to get better, but she is good now. She was sick because she has chronic asthma and allergies. She was seen by Dr. Ishmael Holter at the allergy and asthma center here in [place].   She said that she lost 30 pounds since September and that she doesn't eat as much as she used to.  She got married in October.  She denies any chest pain, and says that her breathing is okay.   She has been having stomach issues and she was referred by Neil Crouch to go to [facility] in May. She said they may do a colonoscopy.   She goes back to her PCP Dr. Criss Rosales in June.  MEDICAL HISTORY:  Past Medical History  Diagnosis Date  . Amenorrhea   . Dysmenorrhea   . COPD (chronic obstructive pulmonary disease) ([place])   . Dysrhythmia     DR Johnsie Cancel    . Asthma   . Sleep apnea     CPAP  . Shortness of breath     WITH EXERTION   . GERD (gastroesophageal reflux disease)     HEARTBURN   TUMS  .  Neuromuscular disorder (HCC)     RESTLESS LEG   . Depression   . Anxiety   . Obesity   . Morbid obesity ([place])   . Ectopic pregnancy 2013  . Eosinophilic esophagitis     Diagnosed at [facility] 06/16/2013, untreated  . Depression   . Anxiety   . Schizoaffective disorder, bipolar type ([place])   . Diabetes mellitus without complication ([place])     SURGICAL HISTORY: Past Surgical History  Procedure Laterality Date  . Cholecystectomy    . Tonsillectomy    . Tooth extraction  10/28/2011    Procedure: DENTAL RESTORATION/EXTRACTIONS;  Surgeon: Gae Bon, DDS;  Location: [place];  Service: Oral Surgery;;  . Dental surgery    . Esophagogastroduodenoscopy  May 2007    Dr. Gala Romney: Normal esophagus, stomach, D1, D2  . Esophagogastroduodenoscopy  06/16/2013    Dr. Carlton Adam, eosinophilic esophagitis, reactive gastropathy, no esophageal dilation    SOCIAL HISTORY: Social History   Social History  . Marital Status: Single    Spouse Name: N/A  . Number of Children: 0  . Years of Education: N/A   Occupational History  . volunteer    Social History Main Topics  . Smoking status: Former Smoker -- 0.50 packs/day for 8 years    Types: Cigarettes    Quit date: 04/25/2011  .  Smokeless tobacco: Never Used  . Alcohol Use: No     Comment: occasional   . Drug Use: No  . Sexual Activity: Not Currently    Birth Control/ Protection: None     Comment: stopped smoking in jan.. had a pack the other day   Other Topics Concern  . 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 She has no children. She has had one tubal pregnancy. She is not married but in a relationship. She quit smoking 6 years ago and smoked one ppd from the age of [age].  She used to drink ETOH but quit in 2010.  FAMILY HISTORY: Family History  Problem Relation Age of Onset  . Depression Mother   . Hypertension Sister   . Diabetes  Paternal Uncle   . Anxiety disorder Paternal Uncle   . Colon polyps Maternal Grandmother     41s  . Liver disease Neg Hx   . Crohn's disease Maternal Aunt    indicated that her mother is alive. She indicated that her sister is alive.   Mother is alive at 72. She has "stomach issues"  Her father died in his 21's from AIDS. Her maternal grandmother had colon cancer.  She did not die from her disease.   ALLERGIES:  is allergic to bee venom; penicillins; adhesive; latex; and vancomycin.  MEDICATIONS:  Current Outpatient Prescriptions  Medication Sig Dispense Refill  . acetaminophen (TYLENOL) 500 MG tablet Take 1,000 mg by mouth every 6 (six) hours as needed. pain    . albuterol (PROAIR HFA) 108 (90 BASE) MCG/ACT inhaler Inhale 2 puffs into the lungs every 4 (four) hours as needed for wheezing or shortness of breath (cough). 1 Inhaler 2  . Azelastine HCl 0.15 % SOLN Place 1 spray into the nose 2 (two) times daily as needed. 30 mL 1  . buPROPion (WELLBUTRIN XL) 300 MG 24 hr tablet Take 300 mg by mouth daily.  3  . busPIRone (BUSPAR) 10 MG tablet Take 15 mg by mouth 2 (two) times daily. For anxiety    . dicyclomine (BENTYL) 20 MG tablet Take 1 tablet (20 mg total) by mouth 3 (three) times daily before meals. As needed for abdominal cramps or diarrhea (Patient taking differently: Take 20 mg by mouth as needed. As needed for abdominal cramps or diarrhea) 90 tablet 2  . diphenhydrAMINE (BENADRYL) 25 MG tablet Take 25 mg by mouth once as needed for allergies.    Marland Kitchen FLUVIRIN SUSP Inject 1 each as directed once.  0  . furosemide (LASIX) 40 MG tablet Take 40 mg by mouth daily. Reported on 05/30/2015  0  . loratadine (CLARITIN) 10 MG tablet TAKE 1 TABLET BY MOUTH EVERY DAY FOR RUNNY NOSE 34 tablet 5  . medroxyPROGESTERone (PROVERA) 10 MG tablet Take 2 tablets by mouth daily. For 5 days  2  . metFORMIN (GLUCOPHAGE) 500 MG tablet Take 500 mg by mouth daily with breakfast.    . mometasone (ASMANEX) 220  MCG/INH inhaler Inhale 2 puffs into the lungs 2 (two) times daily. 1 Inhaler 2  . montelukast (SINGULAIR) 10 MG tablet Take 1 tablet (10 mg total) by mouth at bedtime. 34 tablet 5  . pantoprazole (PROTONIX) 40 MG tablet TAKE ONE TABLET BY MOUTH DAILY 30 tablet 11  . potassium chloride (MICRO-K) 10 MEQ CR capsule Take 10 mEq by mouth 2 (two) times daily.  Reported on 05/30/2015    . Prenatal Vit-Fe Fumarate-FA (PRENATAL MULTIVITAMIN) TABS Take 1 tablet by mouth daily.     Marland Kitchen rOPINIRole (REQUIP) 0.5 MG tablet Take 1 mg by mouth at bedtime.   5  . vitamin E 400 UNIT capsule Take 400 Units by mouth daily.    . ziprasidone (GEODON) 60 MG capsule Take 60 mg by mouth every evening.     . [DISCONTINUED] fluticasone (FLONASE) 50 MCG/ACT nasal spray INHALE 1 TO 2 SPRAYS IN EACH NOSTRIL DAILY (Patient not taking: Reported on 04/26/2015) 16 g 2   Current Facility-Administered Medications  Medication Dose Route Frequency Provider Last Rate Last Dose  . [COMPLETED] ipratropium (ATROVENT) nebulizer solution 0.5 mg  0.5 mg Nebulization Once Gean Quint, MD   0.5 mg at 03/28/15 1725   Review of Systems  Constitutional: egative.  N HENT: Negative.   Eyes: Negative.   Respiratory: Positive for asthma. Negative for chest pain or breathing problems. Chronic asthma.  Cardiovascular: Negative.   Gastrointestinal: Negative Skin: Negative.   Neurological: Negative.   Endo/Heme/Allergies: Positive for seasonal allergies. Chronic allergies. Psychiatric/Behavioral: Negative.    14 point review of systems was performed and is negative except as detailed under history of present illness and above   PHYSICAL EXAMINATION: ECOG PERFORMANCE STATUS: 1 - Symptomatic but completely ambulatory  Filed Vitals:   07/05/15 1100  BP: 147/71  Pulse: 85  Temp: 97.7 F (36.5 C)  Resp: 20   Filed Weights   07/05/15 1100  Weight: 433 lb (196.408 kg)    Physical Exam  Constitutional: She is oriented to person, place,  and time and well-developed, well-nourished, and in no distress.  Morbidly Obese  HENT:  Head: Normocephalic and atraumatic.  Nose: Nose normal.  Mouth/Throat: Oropharynx is clear and moist. No oropharyngeal exudate.  Eyes: Conjunctivae and EOM are normal. Pupils are equal, round, and reactive to light. Right eye exhibits no discharge. Left eye exhibits no discharge. No scleral icterus.  Neck: Normal range of motion. Neck supple. No tracheal deviation present. No thyromegaly present.  Cardiovascular: Normal rate, regular rhythm and normal heart sounds.  Exam reveals no gallop and no friction rub.   No murmur heard. Pulmonary/Chest: Effort normal and breath sounds normal. She has no wheezes. She has no rales.  Abdominal: Soft. Bowel sounds are normal. She exhibits no distension and no mass. There is no tenderness. There is no rebound and no guarding.  Exam limited by obesity; patient remains seated in chair.  Musculoskeletal: Normal range of motion. She exhibits no edema.  Lymphadenopathy:    She has no cervical adenopathy.  Neurological: She is alert and oriented to person, place, and time.  No cranial nerve deficit.   Skin: Skin is warm and dry. No rash noted. She is not diaphoretic.  Psychiatric: Mood, memory, affect and judgment normal.  Nursing note and vitals reviewed.   LABORATORY DATA:  I have reviewed the data as listed Lab Results  Component Value Date   WBC 12.8* 07/05/2015   HGB 12.7 07/05/2015   HCT 39.9 07/05/2015   MCV 86.2 07/05/2015   PLT 375 07/05/2015     Chemistry      Component Value Date/Time   NA 135 12/09/2014 0949   K 3.6 12/09/2014 0949   CL 104 12/09/2014 0949   CO2 26 12/09/2014 0949   BUN 6 12/09/2014 0949   CREATININE 0.58 12/09/2014 0949   CREATININE 0.70 07/14/2014 1451      Component Value  Date/Time   CALCIUM 8.5* 12/09/2014 0949   ALKPHOS 46 12/07/2014 0448   AST 21 12/07/2014 0448   ALT 17 12/07/2014 0448   BILITOT 0.4 12/07/2014 0448       ASSESSMENT & PLAN:  LEUKOCYTOSIS NEUTROPHILIA THROMBOCYTOSIS JAK 2 negative BCR ABL negative,negative for MPL or CALR mutation ESR 51 Asthma, mild persistent Rhinitis Eosinophilic esophagitis  40 year old female with a history of eosinophilic esophagitis who presents for further evaluation of chronic leukocytosis/neutrophilia.She has also had intermittent thrombocytosis. She has documented mild persistent asthma and rhinitis. She has been seen by Dr. Ishmael Holter here in [place].   I suspect the findings are reactive or secondary to inflammation/infllamatory conditition.    I will see her back in six months. If labs continue to stay stable we will move her visits out to yearly then eventually discharge.   Orders Placed This Encounter  Procedures  . CBC with Differential    Standing Status: Future     Number of Occurrences:      Standing Expiration Date: 07/04/2016   I discussed my recommendations in detail and she understands and agrees with the plan as detailed.  All questions were answered. The patient knows to call the clinic with any problems, questions or concerns.  This note was electronically signed.    This document serves as a record of services personally performed by Ancil Linsey, MD. It was created on her behalf by Kandace Blitz, a trained medical scribe. The creation of this record is based on the scribe's personal observations and the provider's statements to them. This document has been checked and approved by the attending provider.  I have reviewed the above documentation for accuracy and completeness, and I agree with the above.  [place] Fam. Aniya Jolicoeur MD

## 2015-07-20 DIAGNOSIS — J399 Disease of upper respiratory tract, unspecified: Secondary | ICD-10-CM | POA: Diagnosis not present

## 2015-07-20 DIAGNOSIS — F3341 Major depressive disorder, recurrent, in partial remission: Secondary | ICD-10-CM | POA: Diagnosis not present

## 2015-07-30 ENCOUNTER — Other Ambulatory Visit: Payer: Self-pay | Admitting: Allergy and Immunology

## 2015-08-03 ENCOUNTER — Encounter: Payer: Medicare Other | Attending: Family Medicine | Admitting: Nutrition

## 2015-08-03 DIAGNOSIS — Z713 Dietary counseling and surveillance: Secondary | ICD-10-CM | POA: Diagnosis not present

## 2015-08-03 DIAGNOSIS — Z6841 Body Mass Index (BMI) 40.0 and over, adult: Secondary | ICD-10-CM | POA: Insufficient documentation

## 2015-08-03 DIAGNOSIS — E739 Lactose intolerance, unspecified: Secondary | ICD-10-CM | POA: Insufficient documentation

## 2015-08-03 DIAGNOSIS — R739 Hyperglycemia, unspecified: Secondary | ICD-10-CM

## 2015-08-03 NOTE — Progress Notes (Signed)
  Medical Nutrition Therapy:  Appt start time: [id number] end time:  1600.  Assessment:  Primary concerns today: Obesity. Lost 4 lbs in the last month. Changes: Eating more protein and adding more vegetables with meals. Has cut out snacks and only drinking water..  Has some cravings for ice cream but trying to eat yogurt instead.. Has cut down on popcorn. Exercise ; walking now and wants to start going to swimming at the [facility] Has been climbing steps now instead of elevator.  Lost about 50 lbs in the 3 years.  Diet is improving and she is making progress with weight loss slowly.   Prediabetes A1C 5.7%.   Preferred Learning Style:   Auditory  Visual  Hands on  Learning Readiness:   Ready  Change in progress   MEDICATIONS: See list   DIETARY INTAKE: 24-hr recall:  B ( AM): Raisin bran cereal with milk Snk ( AM):  L ( PM): [place] casserole, water Snk ( PM): none D ( PM): Spaghetti and water.  Snk ( PM): none Beverages: water.  Usual physical activity: walking some  Estimated energy needs: 1500 calories 170 g carbohydrates 112 g protein 42 g fat  Progress Towards Goal(s):  In progress.   Nutritional Diagnosis:  NB-1.1 Food and nutrition-related knowledge deficit As related to Severe Obesity.  As evidenced by BMI >70.Marland Kitchen    Intervention:  Nutrition counseling and DM education on My Plate, portion sizes, CHO counting, balanced meals, meal planning, benefits of exericise and walking for needed weight loss, slowing down on eating, emotional eating, low fat low sodium high fiber diet and healthy weight loss tips.  Goals 1. Follow My Plate 2. Increase exercise and add swimming three days per week. 3. Increase fresh fruits and vegetables. 4. Lose 4-5 lbs each month.    Teaching Method Utilized:  Visual Auditory Hands on  Handouts given during visit include:  The Plate Method  Meal Plan Card  Diabetes Instructions  Barriers to learning/adherence to lifestyle  change: Severe obesity  Demonstrated degree of understanding via:  Teach Back   Monitoring/Evaluation:  Dietary intake, exercise, meal planning, and body weight in 3 month(s).

## 2015-08-03 NOTE — Patient Instructions (Signed)
  Goals 1. Follow My Plate 2. Increase exercise and add swimming three days per week. 3. Increase fresh fruits and vegetables. 4. Lose 4-5 lbs each month.

## 2015-08-18 DIAGNOSIS — R7301 Impaired fasting glucose: Secondary | ICD-10-CM | POA: Diagnosis not present

## 2015-08-18 DIAGNOSIS — F3341 Major depressive disorder, recurrent, in partial remission: Secondary | ICD-10-CM | POA: Diagnosis not present

## 2015-08-18 DIAGNOSIS — Z6841 Body Mass Index (BMI) 40.0 and over, adult: Secondary | ICD-10-CM | POA: Diagnosis not present

## 2015-08-22 DIAGNOSIS — M542 Cervicalgia: Secondary | ICD-10-CM | POA: Diagnosis not present

## 2015-08-22 DIAGNOSIS — M9901 Segmental and somatic dysfunction of cervical region: Secondary | ICD-10-CM | POA: Diagnosis not present

## 2015-08-22 DIAGNOSIS — M9902 Segmental and somatic dysfunction of thoracic region: Secondary | ICD-10-CM | POA: Diagnosis not present

## 2015-08-22 DIAGNOSIS — M546 Pain in thoracic spine: Secondary | ICD-10-CM | POA: Diagnosis not present

## 2015-08-30 DIAGNOSIS — F332 Major depressive disorder, recurrent severe without psychotic features: Secondary | ICD-10-CM | POA: Diagnosis not present

## 2015-09-05 ENCOUNTER — Other Ambulatory Visit: Payer: Self-pay | Admitting: *Deleted

## 2015-09-05 MED ORDER — FLUTICASONE PROPIONATE HFA 110 MCG/ACT IN AERO: 2.0000 | INHALATION_SPRAY | Freq: Two times a day (BID) | RESPIRATORY_TRACT | Status: DC

## 2015-10-02 DIAGNOSIS — Z6841 Body Mass Index (BMI) 40.0 and over, adult: Secondary | ICD-10-CM | POA: Diagnosis not present

## 2015-10-02 DIAGNOSIS — G2581 Restless legs syndrome: Secondary | ICD-10-CM | POA: Diagnosis not present

## 2015-10-02 DIAGNOSIS — R7301 Impaired fasting glucose: Secondary | ICD-10-CM | POA: Diagnosis not present

## 2015-10-02 DIAGNOSIS — F064 Anxiety disorder due to known physiological condition: Secondary | ICD-10-CM | POA: Diagnosis not present

## 2015-10-02 DIAGNOSIS — F3341 Major depressive disorder, recurrent, in partial remission: Secondary | ICD-10-CM | POA: Diagnosis not present

## 2015-10-02 DIAGNOSIS — F060 Psychotic disorder with hallucinations due to known physiological condition: Secondary | ICD-10-CM | POA: Diagnosis not present

## 2015-11-02 ENCOUNTER — Ambulatory Visit: Payer: Self-pay | Admitting: Nutrition

## 2015-11-05 ENCOUNTER — Other Ambulatory Visit: Payer: Self-pay | Admitting: Gastroenterology

## 2015-11-06 ENCOUNTER — Encounter: Payer: Medicare Other | Attending: Family Medicine | Admitting: Nutrition

## 2015-11-06 ENCOUNTER — Encounter: Payer: Self-pay | Admitting: Nutrition

## 2015-11-06 DIAGNOSIS — Z713 Dietary counseling and surveillance: Secondary | ICD-10-CM | POA: Diagnosis not present

## 2015-11-06 DIAGNOSIS — R739 Hyperglycemia, unspecified: Secondary | ICD-10-CM

## 2015-11-06 NOTE — Patient Instructions (Addendum)
Goals: 1. Increase vegetables to 2 servings per day. 2. Increase walking to 20-30 minutes per day. 3. Increasee protein intake of shakes, meat, eggs 4. Chew foods more thoroughly 5. Put fork down between bites. 6. Lose 1-2 lbs per week

## 2015-11-06 NOTE — Progress Notes (Signed)
  Medical Nutrition Therapy:  Appt start time: [id number] end time:  1545  Assessment:  Primary concerns today: Obesity. Lost 3 lbs  . Changes: Eating more protein and adding more vegetables with meals. 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  NO recent labs. Sees PCP next week.  Prediabetes A1C 5.7%.   Preferred Learning Style:   Auditory  Visual  Hands on  Learning Readiness:   Ready  Change in progress   MEDICATIONS: See list   DIETARY INTAKE: 24-hr recall:  B ( AM): Nutrigrain n cereal bar. Snk ( AM):  L ( PM): Chicken strips, peas, roll water Snk ( PM): none D ( PM): BBQ Chicken, mac/ceese and mixed vegetables, water Snk ( PM): none Beverages: water.  Usual physical activity: walking some  Estimated energy needs: 1500 calories 170 g carbohydrates 112 g protein 42 g fat  Progress Towards Goal(s):  In progress.   Nutritional Diagnosis:  NB-1.1 Food and nutrition-related knowledge deficit As related to Severe Obesity.  As evidenced by BMI >70.Marland Kitchen    Intervention:  Nutrition counseling and DM education on My Plate, portion sizes, CHO counting, balanced meals, meal planning, benefits of exericise and walking for needed weight loss, slowing down on eating, emotional eating, low fat low sodium high fiber diet and healthy weight loss tips.  Goals 1. Follow My Plate 2. Increase exercise and add swimming three days per week. 3. Increase fresh fruits and vegetables. 4. Lose 4-5 lbs each month.    Teaching Method Utilized:  Visual Auditory Hands on  Handouts given during visit include:  The Plate Method  Meal Plan Card  Diabetes Instructions  Barriers to learning/adherence to lifestyle change: Severe obesity  Demonstrated degree of understanding via:  Teach Back   Monitoring/Evaluation:  Dietary intake, exercise, meal planning, and body weight in 3 month(s).

## 2015-11-07 ENCOUNTER — Ambulatory Visit (INDEPENDENT_AMBULATORY_CARE_PROVIDER_SITE_OTHER): Payer: Medicare Other | Admitting: Allergy and Immunology

## 2015-11-07 ENCOUNTER — Ambulatory Visit: Payer: Medicare Other | Admitting: Allergy and Immunology

## 2015-11-07 ENCOUNTER — Encounter: Payer: Self-pay | Admitting: Allergy and Immunology

## 2015-11-07 VITALS — BP 118/60 | HR 84 | Temp 97.9°F

## 2015-11-07 DIAGNOSIS — E162 Hypoglycemia, unspecified: Secondary | ICD-10-CM | POA: Insufficient documentation

## 2015-11-07 DIAGNOSIS — J310 Chronic rhinitis: Secondary | ICD-10-CM

## 2015-11-07 DIAGNOSIS — F419 Anxiety disorder, unspecified: Secondary | ICD-10-CM | POA: Insufficient documentation

## 2015-11-07 DIAGNOSIS — F32A Depression, unspecified: Secondary | ICD-10-CM | POA: Insufficient documentation

## 2015-11-07 DIAGNOSIS — T884XXA Failed or difficult intubation, initial encounter: Secondary | ICD-10-CM | POA: Insufficient documentation

## 2015-11-07 DIAGNOSIS — J4530 Mild persistent asthma, uncomplicated: Secondary | ICD-10-CM

## 2015-11-07 DIAGNOSIS — F329 Major depressive disorder, single episode, unspecified: Secondary | ICD-10-CM | POA: Insufficient documentation

## 2015-11-07 DIAGNOSIS — G4733 Obstructive sleep apnea (adult) (pediatric): Secondary | ICD-10-CM | POA: Insufficient documentation

## 2015-11-07 HISTORY — DX: Failed or difficult intubation, initial encounter: T88.4XXA

## 2015-11-07 MED ORDER — FLUTICASONE PROPIONATE HFA 110 MCG/ACT IN AERO: 2.0000 | INHALATION_SPRAY | Freq: Two times a day (BID) | RESPIRATORY_TRACT | 5 refills | Status: DC

## 2015-11-07 MED ORDER — MONTELUKAST SODIUM 10 MG PO TABS: 10.0000 mg | ORAL_TABLET | Freq: Every day | ORAL | 5 refills | Status: DC

## 2015-11-07 MED ORDER — AZELASTINE HCL 0.1 % NA SOLN: NASAL | 3 refills | Status: DC

## 2015-11-07 MED ORDER — ALBUTEROL SULFATE HFA 108 (90 BASE) MCG/ACT IN AERS: INHALATION_SPRAY | RESPIRATORY_TRACT | 1 refills | Status: DC

## 2015-11-07 NOTE — Progress Notes (Signed)
FOLLOW UP NOTE  RE: Izzibella Barfknecht Bihl MRN: [id number] DOB: Aug 29, 1975 [facility] [place] [address], [place] [address] Date of Office Visit: 11/07/2015  Subjective:  Ellison Baro Edrington is a 40 y.o. female who presents today for Asthma (Cough and Wheezing on occ.  Last used Albuterol Inhaler-2 weeks ago.) and Nasal Congestion  Assessment:   1. Mild persistent asthma, improved controlled.   2. Mixed rhinitis.   3.      Unclear bee sting reaction history--appears more consistent with local reaction only  -- with patient report of ED visit but no documentation indicated in Epic system (between May-July 2013, when allergy documentation changed as reviewed for [facility] ED visits). Plan:   Meds ordered this encounter  Medications  . montelukast (SINGULAIR) 10 MG tablet    Sig: Take 1 tablet (10 mg total) by mouth at bedtime.    Dispense:  30 tablet    Refill:  5  . fluticasone (FLOVENT HFA) 110 MCG/ACT inhaler    Sig: Inhale 2 puffs into the lungs 2 (two) times daily.    Dispense:  1 Inhaler    Refill:  5  . albuterol (PROAIR HFA) 108 (90 Base) MCG/ACT inhaler    Sig: 2 Puffs into the lungs every 4 Hours as needed for cough or wheeze.    Dispense:  1 Inhaler    Refill:  1  . azelastine (ASTELIN) 0.1 % nasal spray    Sig: 1 Spray into each nostril two times daily as needed.    Dispense:  30 mL    Refill:  3  1.  Continue current regime. 2.  Add Azelastine 1-2 sprays daily as needed for congestion 3.  Receive influenza vaccine this season through [facility]. 4.  Georgiann will call if continued significant expense for azelastine--consider change to olopatadine if adequate insurance coverage. 5.  Consistently use Saline nasal wash twice daily and as needed. 6.  ProAir HFA 2 puffs every 4 hours as needed--call with any recurring use. 7.  Continue with activity and weight loss as previously. 8.  Follow-up in 6 months or sooner if needed.  HPI: Shaelynne  returns to the office in follow-up of mixed rhinitis and asthma.  Since her last visit in February, she describes tolerating new medication without difficulty.  She recalls only rare occasion of Pro Air use, last 2 weeks ago when there was poor air condition/circulation in church for cough and chest congestion, only once.  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  She denies any difficulty breathing, shortness of breath, chest tightness, nocturnal awakening or disrupted activity.  She had not been using any nasal spray at this time, given previous concern for expense of azelastine, though there is mild intermittent nasal congestion without postnasal drip, discolored drainage, sore throat, fever or sinus pressure. She reports feeling well today without additional questions or concerns and continues to follow with Dr. Criss Rosales. We reviewed the history of bee sting, which appears to have occurred in 2013 based on change of Allergy documentation in Epic.  However, there is no specific ED visit documented for bee sting reaction or any treatment and based on description today appears there was large local swelling, possibly at her shoulder area. Denies ED or urgent care visits, prednisone or antibiotic courses. Reports sleep and activity are normal.  Mairim has a current  medication list which includes the following prescription(s): acetaminophen, albuterol, bupropion, buspirone, dicyclomine, diphenhydramine, loratadine, metformin, pantoprazole, prenatal multivitamin, vitamin e, montelukast, ropinirole, and ziprasidone.   Drug Allergies: Allergies  Allergen Reactions  . Bee Venom Unclear history appears to be local reaction only.  Marland Kitchen Penicillins Anaphylaxis  . Adhesive [Tape] Rash  . Latex Rash  . Vancomycin Other (See Comments)    Pt can tolerate Vancomycin but did cause Red-Man Syndrome.  Recommend to pre-medicate with Benadryl  before doses administered.     Objective:   Vitals:   11/07/15 1532  BP: 118/60  Pulse: 84  Temp: 97.9 F (36.6 C)   SpO2 Readings from Last 1 Encounters:  11/07/15 97%   Physical Exam  Constitutional: She is well-developed, well-nourished, and in no distress.  HENT:  Head: Atraumatic.  Right Ear: Tympanic membrane and ear canal normal.  Left Ear: Tympanic membrane and ear canal normal.  Nose: Mucosal edema present. No rhinorrhea. No epistaxis.  Mouth/Throat: Oropharynx is clear and moist and mucous membranes are normal. No oropharyngeal exudate, posterior oropharyngeal edema or posterior oropharyngeal erythema.  Neck: Neck supple.  Cardiovascular: Normal rate, S1 normal and S2 normal.   No murmur heard. Pulmonary/Chest: Effort normal. She has no wheezes. She has no rhonchi. She has no rales.  Lymphadenopathy:    She has no cervical adenopathy.   Diagnostics: Spirometry:  FVC2.65--- 82%,  FEV1 2.11 --- 75%.  (see scanned image).    Roselyn M. Ishmael Holter, MD  cc: [name], MD

## 2015-11-08 NOTE — Patient Instructions (Signed)
   Continue current regime.  Add Azelastine 1-2 sprays daily  As needed for congestion.  Receive influenza vaccine this season through [facility].  Heather [name] will call if continued significant expense for azelastine--consider change to olopatadine.  Saline nasal wash twice daily and as needed.  ProAir HFA 2 puffs every 4 hours as needed--call with any recurring use.  Continue with activity and weight loss as previously.  Follow-up in 6 months or sooner if needed.

## 2015-11-13 DIAGNOSIS — J0101 Acute recurrent maxillary sinusitis: Secondary | ICD-10-CM | POA: Diagnosis not present

## 2015-12-20 DIAGNOSIS — F332 Major depressive disorder, recurrent severe without psychotic features: Secondary | ICD-10-CM | POA: Diagnosis not present

## 2015-12-25 DIAGNOSIS — G2581 Restless legs syndrome: Secondary | ICD-10-CM | POA: Diagnosis not present

## 2015-12-25 DIAGNOSIS — E669 Obesity, unspecified: Secondary | ICD-10-CM | POA: Diagnosis not present

## 2015-12-25 DIAGNOSIS — F3341 Major depressive disorder, recurrent, in partial remission: Secondary | ICD-10-CM | POA: Diagnosis not present

## 2015-12-25 DIAGNOSIS — F064 Anxiety disorder due to known physiological condition: Secondary | ICD-10-CM | POA: Diagnosis not present

## 2016-01-09 ENCOUNTER — Ambulatory Visit (HOSPITAL_COMMUNITY): Payer: Self-pay | Admitting: Oncology

## 2016-01-09 ENCOUNTER — Other Ambulatory Visit (HOSPITAL_COMMUNITY): Payer: Self-pay

## 2016-02-05 ENCOUNTER — Encounter: Payer: Medicare Other | Attending: Family Medicine | Admitting: Nutrition

## 2016-02-05 ENCOUNTER — Encounter: Payer: Self-pay | Admitting: Nutrition

## 2016-02-05 DIAGNOSIS — Z713 Dietary counseling and surveillance: Secondary | ICD-10-CM | POA: Diagnosis not present

## 2016-02-05 DIAGNOSIS — E119 Type 2 diabetes mellitus without complications: Secondary | ICD-10-CM

## 2016-02-05 NOTE — Progress Notes (Signed)
  Medical Nutrition Therapy:  Appt start time: [id number] end time:  1545  Assessment:  Primary concerns today: Obesity.. Just got back from cruise and admits she over did it. Gained 4 lbs. Working on getting back on track with her foods, exercise. Prediabetes A1C 5.7%.   Preferred Learning Style:   Auditory  Visual  Hands on  Learning Readiness:   Ready  Change in progress   MEDICATIONS: See list   DIETARY INTAKE: 24-hr recall:  B ( AM): Raisin bran, water Snk ( AM):  L ( PM): Chicken patty salad and baked beans,  water Snk ( PM): none D ( PM):chef salad with ranch,  Snk ( PM): none Beverages: water.  Usual physical activity: walking some  Estimated energy needs: 1500 calories 170 g carbohydrates 112 g protein 42 g fat  Progress Towards Goal(s):  In progress.   Nutritional Diagnosis:  NB-1.1 Food and nutrition-related knowledge deficit As related to Severe Obesity.  As evidenced by BMI >70.Marland Kitchen    Intervention:  Nutrition counseling and DM education on My Plate, portion sizes, CHO counting, balanced meals, meal planning, benefits of exericise and walking for needed weight loss, slowing down on eating, emotional eating, low fat low sodium high fiber diet and healthy weight loss tips.  Goals Get back on track with eating more fresh fruits and low carb vegetables. Drink more water Exercise to 30 minutes daily. Lose 1-2 lbs  Increase fiber rich, foods   Teaching Method Utilized:  Visual Auditory Hands on  Handouts given during visit include:  The Plate Method  Meal Plan Card  Diabetes Instructions  Barriers to learning/adherence to lifestyle change: Severe obesity  Demonstrated degree of understanding via:  Teach Back   Monitoring/Evaluation:  Dietary intake, exercise, meal planning, and body weight in 3 month(s).

## 2016-02-05 NOTE — Patient Instructions (Addendum)
Goals Get back on track with eating more fresh fruits and low carb vegetables. Drink more water Exercise to 30 minutes daily. Lose 1-2 lbs  Increase fiber rich, foods

## 2016-02-08 DIAGNOSIS — M9901 Segmental and somatic dysfunction of cervical region: Secondary | ICD-10-CM | POA: Diagnosis not present

## 2016-02-08 DIAGNOSIS — M545 Low back pain: Secondary | ICD-10-CM | POA: Diagnosis not present

## 2016-02-08 DIAGNOSIS — M542 Cervicalgia: Secondary | ICD-10-CM | POA: Diagnosis not present

## 2016-02-08 DIAGNOSIS — M546 Pain in thoracic spine: Secondary | ICD-10-CM | POA: Diagnosis not present

## 2016-02-08 DIAGNOSIS — M9903 Segmental and somatic dysfunction of lumbar region: Secondary | ICD-10-CM | POA: Diagnosis not present

## 2016-02-08 DIAGNOSIS — M9902 Segmental and somatic dysfunction of thoracic region: Secondary | ICD-10-CM | POA: Diagnosis not present

## 2016-02-09 DIAGNOSIS — J069 Acute upper respiratory infection, unspecified: Secondary | ICD-10-CM | POA: Diagnosis not present

## 2016-02-14 DIAGNOSIS — M9902 Segmental and somatic dysfunction of thoracic region: Secondary | ICD-10-CM | POA: Diagnosis not present

## 2016-02-14 DIAGNOSIS — M546 Pain in thoracic spine: Secondary | ICD-10-CM | POA: Diagnosis not present

## 2016-02-14 DIAGNOSIS — M9903 Segmental and somatic dysfunction of lumbar region: Secondary | ICD-10-CM | POA: Diagnosis not present

## 2016-02-14 DIAGNOSIS — M545 Low back pain: Secondary | ICD-10-CM | POA: Diagnosis not present

## 2016-02-14 DIAGNOSIS — M542 Cervicalgia: Secondary | ICD-10-CM | POA: Diagnosis not present

## 2016-02-14 DIAGNOSIS — M9901 Segmental and somatic dysfunction of cervical region: Secondary | ICD-10-CM | POA: Diagnosis not present

## 2016-02-20 ENCOUNTER — Encounter (HOSPITAL_COMMUNITY): Payer: Medicare Other

## 2016-02-20 ENCOUNTER — Encounter: Payer: Self-pay | Admitting: Family Medicine

## 2016-02-20 ENCOUNTER — Encounter (HOSPITAL_COMMUNITY): Payer: Self-pay | Admitting: Oncology

## 2016-02-20 ENCOUNTER — Encounter (HOSPITAL_COMMUNITY): Payer: Medicare Other | Attending: Oncology | Admitting: Oncology

## 2016-02-20 VITALS — BP 148/79 | HR 93 | Temp 97.9°F | Resp 20 | Ht 63.0 in | Wt >= 6400 oz

## 2016-02-20 DIAGNOSIS — D72829 Elevated white blood cell count, unspecified: Secondary | ICD-10-CM | POA: Diagnosis not present

## 2016-02-20 DIAGNOSIS — Z23 Encounter for immunization: Secondary | ICD-10-CM

## 2016-02-20 DIAGNOSIS — D72828 Other elevated white blood cell count: Secondary | ICD-10-CM

## 2016-02-20 LAB — CBC WITH DIFFERENTIAL/PLATELET
BASOS PCT: 0 %
Basophils Absolute: 0 10*3/uL (ref 0.0–0.1)
EOS ABS: 0.4 10*3/uL (ref 0.0–0.7)
Eosinophils Relative: 3 %
HEMATOCRIT: 37 % (ref 36.0–46.0)
Hemoglobin: 11.5 g/dL — ABNORMAL LOW (ref 12.0–15.0)
Lymphocytes Relative: 23 %
Lymphs Abs: 2.7 10*3/uL (ref 0.7–4.0)
MCH: 27.5 pg (ref 26.0–34.0)
MCHC: 31.1 g/dL (ref 30.0–36.0)
MCV: 88.5 fL (ref 78.0–100.0)
MONO ABS: 0.6 10*3/uL (ref 0.1–1.0)
MONOS PCT: 5 %
Neutro Abs: 8.2 10*3/uL — ABNORMAL HIGH (ref 1.7–7.7)
Neutrophils Relative %: 69 %
Platelets: 383 10*3/uL (ref 150–400)
RBC: 4.18 MIL/uL (ref 3.87–5.11)
RDW: 15.9 % — ABNORMAL HIGH (ref 11.5–15.5)
WBC: 11.9 10*3/uL — ABNORMAL HIGH (ref 4.0–10.5)

## 2016-02-20 MED ORDER — INFLUENZA VAC SPLIT QUAD 0.5 ML IM SUSY
PREFILLED_SYRINGE | INTRAMUSCULAR | Status: AC
  Filled 2016-02-20: qty 0.5

## 2016-02-20 MED ORDER — INFLUENZA VAC SPLIT QUAD 0.5 ML IM SUSY
0.5000 mL | PREFILLED_SYRINGE | Freq: Once | INTRAMUSCULAR | Status: AC
  Administered 2016-02-20: 0.5 mL via INTRAMUSCULAR

## 2016-02-20 NOTE — Progress Notes (Signed)
Pt given flu shot today. Flu shot given in left deltoid. Pt tolerated well. Pt stable and discharged home ambulatory.

## 2016-02-20 NOTE — Progress Notes (Signed)
Heather [name], [place] [address] [place]  [address]  Need for prophylactic vaccination and inoculation against influenza  Other elevated white blood cell (WBC) count  CURRENT THERAPY: Observation  INTERVAL HISTORY: Heather [name] 40 y.o. female returns for followup of leukocytosis with neutrophilia predominance (since at least 2008) in the setting of eosinophilic esophagitis and reactive gastropathy diagnosed at [facility] in 2015 and asthma.  Peripheral work-up has been negative including JAK2/MPL/CALR.  ESR 51 (08/23/2014).   She denies any B symptoms, hospitalizations, infections, or recurrent antibiotic needs.  She recently went on a cruise for her 1 year wedding anniversary.  She had a nice time.    Review of Systems  Constitutional: Negative for chills, fever, malaise/fatigue and weight loss.  HENT: Negative.   Eyes: Negative.   Respiratory: Negative.  Negative for cough.   Cardiovascular: Negative.  Negative for chest pain.  Gastrointestinal: Negative.  Negative for abdominal pain, constipation, diarrhea, nausea and vomiting.  Genitourinary: Negative.  Negative for dysuria.  Musculoskeletal: Negative.   Skin: Negative.  Negative for rash.  Neurological: Negative.  Negative for weakness.  Endo/Heme/Allergies: Negative.   Psychiatric/Behavioral: Negative.     Past Medical History:  Diagnosis Date  . Amenorrhea   . Anxiety   . Anxiety   . Asthma   . COPD (chronic obstructive pulmonary disease) ([place])   . Depression   . Depression   . Diabetes mellitus without complication ([place])   . Dysmenorrhea   . Dysrhythmia    DR Johnsie Cancel    . Ectopic pregnancy 2013  . Eosinophilic esophagitis    Diagnosed at [facility] 06/16/2013, untreated  . GERD (gastroesophageal reflux disease)    HEARTBURN   TUMS  . Hard to intubate 11/07/2015  . Leukocytosis 07/28/2008   Qualifier: Diagnosis of  By: Jonna Munro MD, Roderic Scarce    . Morbid obesity ([place])   . Neuromuscular disorder  (HCC)    RESTLESS LEG   . Obesity   . Schizoaffective disorder, bipolar type ([place])   . Shortness of breath    WITH EXERTION   . Sleep apnea    CPAP    Past Surgical History:  Procedure Laterality Date  . CHOLECYSTECTOMY    . DENTAL SURGERY    . ESOPHAGOGASTRODUODENOSCOPY  May 2007   Dr. Gala Romney: Normal esophagus, stomach, D1, D2  . ESOPHAGOGASTRODUODENOSCOPY  06/16/2013   Dr. Carlton Adam, eosinophilic esophagitis, reactive gastropathy, no esophageal dilation  . TONSILLECTOMY    . TOOTH EXTRACTION  10/28/2011   Procedure: DENTAL RESTORATION/EXTRACTIONS;  Surgeon: Gae Bon, DDS;  Location: [facility] OR;  Service: Oral Surgery;;    Family History  Problem Relation Age of Onset  . Depression Mother   . Hypertension Sister   . Allergic rhinitis Sister   . Diabetes Paternal Uncle   . Anxiety disorder Paternal Uncle   . Colon polyps Maternal Grandmother     69s  . Crohn's disease Maternal Aunt   . Liver disease Neg Hx   . Angioedema Neg Hx   . Eczema Neg Hx   . Immunodeficiency Neg Hx   . Asthma Neg Hx   . Urticaria Neg Hx     Social History   Social History  . Marital status: Single    Spouse name: N/A  . Number of children: 0  . Years of education: N/A   Occupational History  . volunteer    Social History Main Topics  . Smoking status: Former Smoker  Packs/day: 0.50    Years: 8.00    Types: Cigarettes    Quit date: 04/25/2011  . Smokeless tobacco: Never Used  . Alcohol use No     Comment: occasional   . Drug use: No  . Sexual activity: Not Currently    Birth control/ protection: None     Comment: stopped smoking in jan.. had a pack the other day   Other Topics Concern  . None   Social History Narrative  . None     PHYSICAL EXAMINATION  ECOG PERFORMANCE STATUS: 1 - Symptomatic but completely ambulatory  Vitals:   02/20/16 1440  BP: (!) 148/79  Pulse: 93  Resp: 20  Temp: 97.9 F (36.6 C)    GENERAL:alert, no distress, cooperative, obese, smiling  and unaccompanied SKIN: skin color, texture, turgor are normal, no rashes or significant lesions HEAD: Normocephalic, No masses, lesions, tenderness or abnormalities EYES: normal, EOMI, Conjunctiva are pink and non-injected EARS: External ears normal OROPHARYNX:lips, buccal mucosa, and tongue normal and mucous membranes are moist  NECK: supple, trachea midline LYMPH:  no palpable lymphadenopathy BREAST:not examined LUNGS: clear to auscultation , distant sounds HEART: regular rate & rhythm and distant heart sounds ABDOMEN:obese BACK: Back symmetric, no curvature. EXTREMITIES:less then 2 second capillary refill, no skin discoloration, no cyanosis  NEURO: alert & oriented x 3 with fluent speech, no focal motor/sensory deficits   LABORATORY DATA: CBC    Component Value Date/Time   WBC 11.9 (H) 02/20/2016 1402   RBC 4.18 02/20/2016 1402   HGB 11.5 (L) 02/20/2016 1402   HCT 37.0 02/20/2016 1402   PLT 383 02/20/2016 1402   MCV 88.5 02/20/2016 1402   MCH 27.5 02/20/2016 1402   MCHC 31.1 02/20/2016 1402   RDW 15.9 (H) 02/20/2016 1402   LYMPHSABS 2.7 02/20/2016 1402   MONOABS 0.6 02/20/2016 1402   EOSABS 0.4 02/20/2016 1402   BASOSABS 0.0 02/20/2016 1402      Chemistry      Component Value Date/Time   NA 135 12/09/2014 0949   K 3.6 12/09/2014 0949   CL 104 12/09/2014 0949   CO2 26 12/09/2014 0949   BUN 6 12/09/2014 0949   CREATININE 0.58 12/09/2014 0949   CREATININE 0.70 07/14/2014 1451      Component Value Date/Time   CALCIUM 8.5 (L) 12/09/2014 0949   ALKPHOS 46 12/07/2014 0448   AST 21 12/07/2014 0448   ALT 17 12/07/2014 0448   BILITOT 0.4 12/07/2014 0448        PENDING LABS:   RADIOGRAPHIC STUDIES:  No results found.   PATHOLOGY:    ASSESSMENT AND PLAN:  Leukocytosis Leukocytosis with neutrophilia predominance (since at least 2008) in the setting of eosinophilic esophagitis and reactive gastropathy diagnosed at [facility] in 2015 and asthma.  Peripheral  work-up has been negative including JAK2/MPL/CALR.  ESR 51 (08/23/2014).   Suspect leukocytosis is reactive to inflammation/inflammatory condition.  Labs today: CBC diff.  I personally reviewed and went over laboratory results with the patient.  The results are noted within this dictation.  CBC is stable.  Labs in 6 and 12 months: CBC diff.  If there is concern for a change in her WBC, then peripheral FLOW cytometry +/- bone marrow aspiration and biopsy can be considered.  Return in 12 months for follow-up.  If labs are stable at that time, then stability of labs has been established for 2+ years and we can consider discharge from the clinic.   ORDERS PLACED FOR THIS ENCOUNTER:  No orders of the defined types were placed in this encounter.   MEDICATIONS PRESCRIBED THIS ENCOUNTER: Meds ordered this encounter  Medications  . Influenza vac split quadrivalent PF (FLUARIX) injection 0.5 mL    THERAPY PLAN:  Continue ongoing observation and if labs are stable over the next 12 months, will discharge patient from the clinic.  All questions were answered. The patient knows to call the clinic with any problems, questions or concerns. We can certainly see the patient much sooner if necessary.  Patient and plan discussed with Dr. Ancil Linsey and she is in agreement with the aforementioned.   This note is electronically signed by: Doy Mince 02/20/2016 5:10 PM

## 2016-02-20 NOTE — Assessment & Plan Note (Addendum)
Leukocytosis with neutrophilia predominance (since at least 2008) in the setting of eosinophilic esophagitis and reactive gastropathy diagnosed at [facility] in 2015 and asthma.  Peripheral work-up has been negative including JAK2/MPL/CALR.  ESR 51 (08/23/2014).   Suspect leukocytosis is reactive to inflammation/inflammatory condition.  Labs today: CBC diff.  I personally reviewed and went over laboratory results with the patient.  The results are noted within this dictation.  CBC is stable.  Labs in 6 and 12 months: CBC diff.  If there is concern for a change in her WBC, then peripheral FLOW cytometry +/- bone marrow aspiration and biopsy can be considered.  Return in 12 months for follow-up.  If labs are stable at that time, then stability of labs has been established for 2+ years and we can consider discharge from the clinic. 

## 2016-02-20 NOTE — Patient Instructions (Signed)
[place] at [facility] Discharge Instructions  RECOMMENDATIONS MADE BY THE CONSULTANT AND ANY TEST RESULTS WILL BE SENT TO YOUR REFERRING PHYSICIAN.  You were seen today by Kirby Crigler PA-C.  Flu shot given today. Return in 6 months for labs.  Return in 12 months for labs and follow up.   Thank you for choosing [place] at [facility] to provide your oncology and hematology care.  To afford each patient quality time with our provider, please arrive at least 15 minutes before your scheduled appointment time.   Beginning January 23rd 2017 lab work for the [facility] will be done in the  Main lab at [facility] on 1st floor. If you have a lab appointment with the [facility] please come in thru the  Main Entrance and check in at the main information desk  You need to re-schedule your appointment should you arrive 10 or more minutes late.  We strive to give you quality time with our providers, and arriving late affects you and other patients whose appointments are after yours.  Also, if you no show three or more times for appointments you may be dismissed from the clinic at the providers discretion.     Again, thank you for choosing [facility].  Our hope is that these requests will decrease the amount of time that you wait before being seen by our physicians.       _____________________________________________________________  Should you have questions after your visit to [facility], please contact our office at [phone] between the hours of 8:30 a.m. and 4:30 p.m.  Voicemails left after 4:30 p.m. will not be returned until the following business day.  For prescription refill requests, have your pharmacy contact our office.         Resources For Cancer Patients and their Caregivers ? American Cancer Society: Can assist with transportation, wigs, general needs, runs Look Good Feel Better.         757-003-8236 ? Cancer Care: Provides financial assistance, online support groups, medication/co-pay assistance.  1-800-813-HOPE 8786551998) ? [place] Assists [place] cancer patients and their families through emotional , educational and financial support.  980-613-7849 ? [place] DSS Where to apply for food stamps, Medicaid and utility assistance. 432-204-3952 ? [facility]: Transportation to medical appointments. 458-170-4003 ? Social Security Administration: May apply for disability if have a Stage IV cancer. 919-305-3240 731-320-6397 ? [employer], Disability and Transit Services: Assists with nutrition, care and transit needs. [place] Support Programs: @10RELATIVEDAYS @ > Cancer Support Group  2nd Tuesday of the month 1pm-2pm, Journey Room  > Creative Journey  3rd Tuesday of the month 1130am-1pm, Journey Room  > Look Good Feel Better  1st Wednesday of the month 10am-12 noon, Journey Room (Call [place] to register [phone])

## 2016-03-13 DIAGNOSIS — Z6841 Body Mass Index (BMI) 40.0 and over, adult: Secondary | ICD-10-CM | POA: Diagnosis not present

## 2016-03-13 DIAGNOSIS — M940 Chondrocostal junction syndrome [Tietze]: Secondary | ICD-10-CM | POA: Diagnosis not present

## 2016-03-19 ENCOUNTER — Ambulatory Visit (INDEPENDENT_AMBULATORY_CARE_PROVIDER_SITE_OTHER): Payer: Medicare Other | Admitting: Allergy & Immunology

## 2016-03-19 ENCOUNTER — Encounter: Payer: Self-pay | Admitting: Allergy & Immunology

## 2016-03-19 VITALS — BP 122/60 | HR 87 | Temp 97.6°F | Resp 14 | Ht 62.99 in | Wt >= 6400 oz

## 2016-03-19 DIAGNOSIS — J3089 Other allergic rhinitis: Secondary | ICD-10-CM

## 2016-03-19 DIAGNOSIS — J019 Acute sinusitis, unspecified: Secondary | ICD-10-CM | POA: Diagnosis not present

## 2016-03-19 DIAGNOSIS — J4530 Mild persistent asthma, uncomplicated: Secondary | ICD-10-CM | POA: Diagnosis not present

## 2016-03-19 MED ORDER — DOXYCYCLINE HYCLATE 100 MG PO TABS: 100.0000 mg | ORAL_TABLET | Freq: Two times a day (BID) | ORAL | 0 refills | Status: AC

## 2016-03-19 MED ORDER — LORATADINE 10 MG PO TABS: ORAL_TABLET | ORAL | 5 refills | Status: DC

## 2016-03-19 MED ORDER — ALBUTEROL SULFATE HFA 108 (90 BASE) MCG/ACT IN AERS: 2.0000 | INHALATION_SPRAY | RESPIRATORY_TRACT | 5 refills | Status: DC | PRN

## 2016-03-19 MED ORDER — AZELASTINE HCL 0.1 % NA SOLN: NASAL | 5 refills | Status: DC

## 2016-03-19 MED ORDER — FLUTICASONE PROPIONATE HFA 110 MCG/ACT IN AERO: 2.0000 | INHALATION_SPRAY | Freq: Two times a day (BID) | RESPIRATORY_TRACT | 5 refills | Status: DC

## 2016-03-19 MED ORDER — MONTELUKAST SODIUM 10 MG PO TABS: 10.0000 mg | ORAL_TABLET | Freq: Every day | ORAL | 5 refills | Status: DC

## 2016-03-19 NOTE — Progress Notes (Signed)
FOLLOW UP  Date of Service/Encounter:  03/19/16   Assessment:   Mild persistent asthma, uncomplicated  Acute sinusitis, recurrence not specified, unspecified location  Chronic nonseasonal allergic rhinitis due to other allergen   Asthma Reportables:  Severity: mild persistent  Risk: medium due to comorbidities Control: well controlled  Seasonal Influenza Vaccine: yes    Plan/Recommendations:   1. Mild persistent asthma, uncomplicated - Lung testing showed some mild restriction that improved with albuterol nebulizer treatment. - Spacer and demonstration provided in order to more effectively deliver the medication.  - We did discuss starting a prednisone burst, but she would prefer to hold off since she tends to eat more while on prednisone.  - This plan seems reasonable since her exam was not overly concerning today.  - Daily controller medication(s): Flovent 173mg two puffs twice daily with a spacer + Singulair 174mdaily - Rescue medications: ProAir 4 puffs every 4-6 hours as needed - Changes during respiratory infections or worsening symptoms: increase Flovent 11064mto 4 puffs twice daily for TWO WEEKS. - Asthma control goals:  * Full participation in all desired activities (may need albuterol before activity) * Albuterol use two time or less a week on average (not counting use with activity) * Cough interfering with sleep two time or less a month * Oral steroids no more than once a year * No hospitalizations  2. Acute sinusitis - Since your symptoms have been ongoing to so long, I would recommended using doxycycline 100m3me tablet twice daily for 14 days.  - Use nasal saline rinses 1-2 times daily to keep mucous down.   3. Chronic allergic rhinitis - Continue with Astelin nasal spray as needed. - Change to cetirizine 10mg71mly as needed for breakthrough allergy symptoms.  4. Return in about 4 months (around 07/18/2016).   Subjective:   Heather [name] is  a 40 y.27 female presenting today for follow up of  Chief Complaint  Patient presents with  . Follow-up    feeling itchy  . Cough    green productive cough, for about 2 weeks  . Nasal Congestion    for 3 days, every time she blows her nose there is blood on the tissue  .  Heather [name] has a history of the following: Patient Active Problem List   Diagnosis Date Noted  . Anxiety and depression 11/07/2015  . Hard to intubate 11/07/2015  . Hypoglycemia 11/07/2015  . OSA (obstructive sleep apnea) 11/07/2015  . DM type 2 (diabetes mellitus, type 2) (HCC) [place]31/2016  . Panniculitis 12/06/2014  . Cellulitis 11/11/2014  . Open wound 11/11/2014  . Sepsis (HCC) [place]05/2016  . Cellulitis, abdominal wall 11/11/2014  . Abdominal pain 07/14/2014  . Loose stools 07/14/2014  . Melena 01/27/2014  . Abdominal pain, lower 01/27/2014  . Eosinophilic esophagitis 07/1694/28/3662bdominal pain, epigastric 04/28/2013  . Bowel habit changes 04/28/2013  . Esophageal dysphagia 04/28/2013  . Insomnia due to mental disorder(327.02) 08/08/2011  . RLS (restless legs syndrome) 08/08/2011  . Adjustment disorder with depressed mood 08/06/2011  . Schizoaffective disorder, bipolar type (HCC) [place]26/2013    Class: Acute  . Preop cardiovascular exam 05/14/2011  . Depression   . Amenorrhea   . Bipolar 1 disorder (HCC) [place] Dysmenorrhea   . PALPITATIONS, OCCASIONAL 11/01/2009  . Bipolar disorder (HCC) [place]14/2010  . DISORDER, TOBACCO USE 08/25/2008  . Leukocytosis 07/28/2008  . DEPRESSION/ANXIETY 07/28/2008  . ALLERGIC RHINITIS, SEASONAL 08/20/2007  . DYSMETABOLIC SYNDROME 05/592/76/5465  Morbid obesity with BMI of 70 and over, adult ([place]) 05/13/2006  . EXTERNAL HEMORRHOIDS 05/13/2006  . HYPERLIPIDEMIA 05/12/2006  . Essential hypertension 05/12/2006  . ASTHMA 05/12/2006  . GERD (gastroesophageal reflux disease) 05/12/2006  . OSTEOARTHRITIS 05/12/2006    History obtained from: chart review and  patient.  [place] Heather [name] was referred by [name], MD.     Heather [name] is a 40 y.o. female presenting for a follow up visit. She was last seen in August 2017 by Dr. Ishmael Holter, who has since left the practice. At that time, she was continued on Flovent, Singulair, Astelin, and [employer] as needed. Her last prednisone burst was given around one year ago.   Since the last visit, she has done well aside from two weeks of green mucous production as well as intermittent blood. This started when she returned from the [place] for an anniversary trip (she has been married for one year now). She returned around the end of October 2017. She then started coughing quite a bit. She went to see her PCP - Dr. Criss Rosales - who gave her an allergy tablet (unknown name but possibly [place]) which helped somewhat. She has continued to cough despite this with productive mucous. She remains on her nasal azelastine although this is not helping in the interim. She has not needed her ProAir as she has not felt short of breath. She last used her ProAir three weeks ago. She remains on her Flovent but does not use a spacer. She feels that her asthma has been under good control. She does awaken at night although this is not related to coughing. She is working very hard on losing weight and has lost a total of 80 pounds in the last calendar year. She does follow closely with a bariatric specialist.   Ms. Arrambide does have a history of mental health problems including depression and anxiety. She is currently receiving disability due to these problems which she has been on since 1999. She met her current husband via groups she was participating in for her mental health disorders. She does work part time at a daycare but has not been in 3+ weeks. Her employer is planning to cut her hours and she is afraid that she is in the process of being laid off.   Otherwise, there have been no changes to her past medical history, surgical history, family  history, or social history.    Review of Systems: a 14-point review of systems is pertinent for what is mentioned in HPI.  Otherwise, all other systems were negative. Constitutional: negative other than that listed in the HPI Eyes: negative other than that listed in the HPI Ears, nose, mouth, throat, and face: negative other than that listed in the HPI Respiratory: negative other than that listed in the HPI Cardiovascular: negative other than that listed in the HPI Gastrointestinal: negative other than that listed in the HPI Genitourinary: negative other than that listed in the HPI Integument: negative other than that listed in the HPI Hematologic: negative other than that listed in the HPI Musculoskeletal: negative other than that listed in the HPI Neurological: negative other than that listed in the HPI Allergy/Immunologic: negative other than that listed in the HPI    Objective:   Blood pressure 122/60, pulse 87, temperature 97.6 F (36.4 C), temperature source Oral, resp. rate 14, height 5' 2.99" (1.6 m), weight (!) 430 lb (195 kg), SpO2 98 %. Body mass index is 76.19 kg/m.   Physical Exam:  General: Alert, interactive, in no acute distress. Obese female. Pleasant and cooperative with the exam.  Eyes: No conjunctival injection present on the right, No conjunctival injection present on the left, PERRL bilaterally, No discharge on the right and No discharge on the left Ears: Right TM unable to be visualized due to cerumen impaction and Left TM unable to be visualized due to cerumen impaction.  Nose/Throat: External nose within normal limits and septum midline, turbinates edematous with clear discharge, post-pharynx markedly erythematous with cobblestoning in the posterior oropharynx. Tonsils 3+ without exudates. Bilateral frontal pressure appreciated.  Neck: Supple without thyromegaly. Lungs: Decreased breath sounds bilaterally without wheezing, rhonchi or rales. No increased  work of breathing. CV: Normal S1/S2, no murmurs. Capillary refill <2 seconds.  Abdomen: Nondistended, nontender. No guarding or rebound tenderness. Bowel sounds present in all fields and hypoactive  Skin: Warm and dry, without lesions or rashes. Neuro:   Grossly intact. No focal deficits appreciated. Responsive to questions.   Diagnostic studies:  Spirometry: results abnormal (FEV1: 1.99/72%, FVC: 2.46/77%, FEV1/FVC: 80%).    Spirometry consistent with possible restrictive disease. DuoNeb treatment provided with significant improvement in the FEV1 of 22m/12%. The FVC increased 8% while the FEF25-75% increased 27%. Pulmonary exam was notable for improved aeration at the bases following the nebulizer treatment.   Allergy Studies: None    JSalvatore Marvel MD [place]

## 2016-03-19 NOTE — Patient Instructions (Addendum)
1. Mild persistent asthma, uncomplicated - Lung testing showed some mild restriction that improved with albuterol nebulizer treatment. - Daily controller medication(s): Flovent 133mcg two puffs twice daily with a spacer + Singulair 10mg  daily - Rescue medications: ProAir 4 puffs every 4-6 hours as needed - Changes during respiratory infections or worsening symptoms: increase Flovent 140mcg to 4 puffs twice daily for TWO WEEKS. - Asthma control goals:  * Full participation in all desired activities (may need albuterol before activity) * Albuterol use two time or less a week on average (not counting use with activity) * Cough interfering with sleep two time or less a month * Oral steroids no more than once a year * No hospitalizations  2. Acute sinusitis - Since your symptoms have been ongoing to so long, I would recommended using doxycycline 100mg  one tablet twice daily for 14 days.  - Use nasal saline rinses 1-2 times daily to keep mucous down.   3. Chronic allergic rhinitis - Continue with Astelin nasal spray as needed. - Change to cetirizine 10mg  daily as needed for breakthrough allergy symptoms.  4. Return in about 4 months (around 07/18/2016).  Please inform us of any Emergency Department visits, hospitalizations, or changes in symptoms. Call us before going to the ED for breathing or allergy symptoms since we might be able to fit you in for a sick visit. Feel free to contact us anytime with any questions, problems, or concerns.  It was a pleasure to meet you today! Have a wonderful holiday season!   Websites that have reliable patient information: 1. American Academy of Asthma, Allergy, and Immunology: www.aaaai.org 2. Food Allergy Research and Education (FARE): foodallergy.org 3. Mothers of Asthmatics: http://www.asthmacommunitynetwork.org 4. American College of Allergy, Asthma, and Immunology: www.acaai.org

## 2016-04-11 ENCOUNTER — Emergency Department (HOSPITAL_COMMUNITY)
Admission: EM | Admit: 2016-04-11 | Discharge: 2016-04-11 | Disposition: A | Discharge status: Home / Self Care | Payer: Medicare HMO | Attending: Emergency Medicine | Admitting: Emergency Medicine

## 2016-04-11 ENCOUNTER — Emergency Department (HOSPITAL_COMMUNITY): Payer: Medicare HMO

## 2016-04-11 ENCOUNTER — Encounter (HOSPITAL_COMMUNITY): Payer: Self-pay | Admitting: Emergency Medicine

## 2016-04-11 DIAGNOSIS — Z79899 Other long term (current) drug therapy: Secondary | ICD-10-CM | POA: Insufficient documentation

## 2016-04-11 DIAGNOSIS — Y929 Unspecified place or not applicable: Secondary | ICD-10-CM | POA: Diagnosis not present

## 2016-04-11 DIAGNOSIS — J45909 Unspecified asthma, uncomplicated: Secondary | ICD-10-CM | POA: Insufficient documentation

## 2016-04-11 DIAGNOSIS — M25562 Pain in left knee: Secondary | ICD-10-CM | POA: Insufficient documentation

## 2016-04-11 DIAGNOSIS — Z87891 Personal history of nicotine dependence: Secondary | ICD-10-CM | POA: Insufficient documentation

## 2016-04-11 DIAGNOSIS — I10 Essential (primary) hypertension: Secondary | ICD-10-CM | POA: Diagnosis not present

## 2016-04-11 DIAGNOSIS — X501XXA Overexertion from prolonged static or awkward postures, initial encounter: Secondary | ICD-10-CM | POA: Insufficient documentation

## 2016-04-11 DIAGNOSIS — E119 Type 2 diabetes mellitus without complications: Secondary | ICD-10-CM | POA: Diagnosis not present

## 2016-04-11 DIAGNOSIS — Z91040 Latex allergy status: Secondary | ICD-10-CM | POA: Diagnosis not present

## 2016-04-11 DIAGNOSIS — J449 Chronic obstructive pulmonary disease, unspecified: Secondary | ICD-10-CM | POA: Diagnosis not present

## 2016-04-11 DIAGNOSIS — Y939 Activity, unspecified: Secondary | ICD-10-CM | POA: Diagnosis not present

## 2016-04-11 DIAGNOSIS — Y999 Unspecified external cause status: Secondary | ICD-10-CM | POA: Diagnosis not present

## 2016-04-11 DIAGNOSIS — M179 Osteoarthritis of knee, unspecified: Secondary | ICD-10-CM | POA: Diagnosis not present

## 2016-04-11 DIAGNOSIS — S8992XA Unspecified injury of left lower leg, initial encounter: Secondary | ICD-10-CM | POA: Diagnosis present

## 2016-04-11 DIAGNOSIS — Z7984 Long term (current) use of oral hypoglycemic drugs: Secondary | ICD-10-CM | POA: Insufficient documentation

## 2016-04-11 NOTE — ED Notes (Signed)
Ace wrap applied to L knee.

## 2016-04-11 NOTE — ED Notes (Signed)
Pt made aware to return if symptoms worsen or if any life threatening symptoms occur.   

## 2016-04-11 NOTE — ED Provider Notes (Signed)
[facility] DEPT Provider Note   CSN: [id number] Arrival date & time: 04/11/16  1232     History   Chief Complaint Chief Complaint  Patient presents with  . Knee Pain    HPI Heather Billmeyer Dornbush is a 41 y.o. female.  Patient presents to the emergency department with chief complaint left knee pain. She states that she almost fell last week, and twisted her left knee. She complains of moderate to severe pain which has been gradually worsening since the initial injury. She states the pain became worse today specifically. She has tried using OTC creams with no relief. She states that the pain is worsened with ambulation. She reports feeling unstable on her left knee. There are no other associated symptoms.   The history is provided by the patient. No language interpreter was used.    Past Medical History:  Diagnosis Date  . Amenorrhea   . Anxiety   . Anxiety   . Asthma   . COPD (chronic obstructive pulmonary disease) ([place])   . Depression   . Depression   . Diabetes mellitus without complication ([place])   . Dysmenorrhea   . Dysrhythmia    DR Johnsie Cancel    . Ectopic pregnancy 2013  . Eosinophilic esophagitis    Diagnosed at [facility] 06/16/2013, untreated  . GERD (gastroesophageal reflux disease)    HEARTBURN   TUMS  . Hard to intubate 11/07/2015  . Leukocytosis 07/28/2008   Qualifier: Diagnosis of  By: Jonna Munro MD, Roderic Scarce    . Morbid obesity ([place])   . Neuromuscular disorder (HCC)    RESTLESS LEG   . Obesity   . Schizoaffective disorder, bipolar type ([place])   . Shortness of breath    WITH EXERTION   . Sleep apnea    CPAP    Patient Active Problem List   Diagnosis Date Noted  . Anxiety and depression 11/07/2015  . Hard to intubate 11/07/2015  . Hypoglycemia 11/07/2015  . OSA (obstructive sleep apnea) 11/07/2015  . DM type 2 (diabetes mellitus, type 2) ([place]) 12/07/2014  . Panniculitis 12/06/2014  . Cellulitis 11/11/2014  . Open wound 11/11/2014  . Sepsis ([place]) 11/11/2014   . Cellulitis, abdominal wall 11/11/2014  . Abdominal pain 07/14/2014  . Loose stools 07/14/2014  . Melena 01/27/2014  . Abdominal pain, lower 01/27/2014  . Eosinophilic esophagitis [id number]  . Abdominal pain, epigastric 04/28/2013  . Bowel habit changes 04/28/2013  . Esophageal dysphagia 04/28/2013  . Insomnia due to mental disorder(327.02) 08/08/2011  . RLS (restless legs syndrome) 08/08/2011  . Adjustment disorder with depressed mood 08/06/2011  . Schizoaffective disorder, bipolar type ([place]) 08/02/2011    Class: Acute  . Preop cardiovascular exam 05/14/2011  . Depression   . Amenorrhea   . Bipolar 1 disorder ([place])   . Dysmenorrhea   . PALPITATIONS, OCCASIONAL 11/01/2009  . Bipolar disorder ([place]) 10/19/2008  . DISORDER, TOBACCO USE 08/25/2008  . Leukocytosis 07/28/2008  . DEPRESSION/ANXIETY 07/28/2008  . ALLERGIC RHINITIS, SEASONAL 08/20/2007  . DYSMETABOLIC SYNDROME [id number]  . Morbid obesity with BMI of 70 and over, adult ([place]) 05/13/2006  . EXTERNAL HEMORRHOIDS 05/13/2006  . HYPERLIPIDEMIA 05/12/2006  . Essential hypertension 05/12/2006  . ASTHMA 05/12/2006  . GERD (gastroesophageal reflux disease) 05/12/2006  . OSTEOARTHRITIS 05/12/2006    Past Surgical History:  Procedure Laterality Date  . CHOLECYSTECTOMY    . DENTAL SURGERY    . ESOPHAGOGASTRODUODENOSCOPY  May 2007   Dr. Gala Romney: Normal esophagus, stomach, D1, D2  . ESOPHAGOGASTRODUODENOSCOPY  06/16/2013  Dr. Carlton Adam, eosinophilic esophagitis, reactive gastropathy, no esophageal dilation  . TONSILLECTOMY    . TOOTH EXTRACTION  10/28/2011   Procedure: DENTAL RESTORATION/EXTRACTIONS;  Surgeon: Gae Bon, DDS;  Location: [facility] OR;  Service: Oral Surgery;;    OB History    Gravida Para Term Preterm AB Living   1       1     SAB TAB Ectopic Multiple Live Births   1               Home Medications    Prior to Admission medications   Medication Sig Start Date End Date Taking? Authorizing Provider    acetaminophen (TYLENOL) 500 MG tablet Take 1,000 mg by mouth every 6 (six) hours as needed. pain    Historical Provider, MD  albuterol (PROAIR HFA) 108 (90 Base) MCG/ACT inhaler Inhale 2 puffs into the lungs every 4 (four) hours as needed for wheezing or shortness of breath (cough). 03/19/16   Valentina Shaggy, MD  azelastine (ASTELIN) 0.1 % nasal spray 1 Spray into each nostril two times daily as needed. 03/19/16   Valentina Shaggy, MD  buPROPion (WELLBUTRIN XL) 300 MG 24 hr tablet Take 300 mg by mouth daily. 04/28/14   Historical Provider, MD  busPIRone (BUSPAR) 10 MG tablet Take 15 mg by mouth 2 (two) times daily. For anxiety    Historical Provider, MD  dicyclomine (BENTYL) 20 MG tablet Take 1 tablet (20 mg total) by mouth 3 (three) times daily before meals. As needed for abdominal cramps or diarrhea Patient taking differently: Take 20 mg by mouth as needed. As needed for abdominal cramps or diarrhea 08/25/14   Mahala Menghini, PA-C  diphenhydrAMINE (BENADRYL) 25 MG tablet Take 25 mg by mouth once as needed for allergies.    Historical Provider, MD  fluticasone (FLOVENT HFA) 110 MCG/ACT inhaler Inhale 2 puffs into the lungs 2 (two) times daily. 03/19/16   Valentina Shaggy, MD  indapamide (LOZOL) 2.5 MG tablet TK 1 T PO QD FOR FLUID 08/14/15   Historical Provider, MD  loratadine (CLARITIN) 10 MG tablet TAKE 1 TABLET BY MOUTH EVERY DAY FOR RUNNY NOSE 03/19/16   Valentina Shaggy, MD  metFORMIN (GLUCOPHAGE) 500 MG tablet Take 500 mg by mouth daily with breakfast.    Historical Provider, MD  montelukast (SINGULAIR) 10 MG tablet Take 1 tablet (10 mg total) by mouth at bedtime. 03/19/16   Valentina Shaggy, MD  pantoprazole (PROTONIX) 40 MG tablet Take 40 mg by mouth daily. 02/09/16   Historical Provider, MD  Prenatal Vit-Fe Fumarate-FA (PRENATAL MULTIVITAMIN) TABS Take 1 tablet by mouth daily.     Historical Provider, MD  rOPINIRole (REQUIP) 1 MG tablet 3mg  QHS 08/14/15   Historical  Provider, MD  vitamin E 400 UNIT capsule Take 400 Units by mouth daily.    Historical Provider, MD  ziprasidone (GEODON) 60 MG capsule Take 60 mg by mouth every evening.     Historical Provider, MD    Family History Family History  Problem Relation Age of Onset  . Depression Mother   . Hypertension Sister   . Allergic rhinitis Sister   . Diabetes Paternal Uncle   . Anxiety disorder Paternal Uncle   . Colon polyps Maternal Grandmother     53s  . Crohn's disease Maternal Aunt   . Liver disease Neg Hx   . Angioedema Neg Hx   . Eczema Neg Hx   . Immunodeficiency Neg Hx   . Asthma  Neg Hx   . Urticaria Neg Hx     Social History Social History  Substance Use Topics  . Smoking status: Former Smoker    Packs/day: 0.50    Years: 8.00    Types: Cigarettes    Quit date: 04/25/2011  . Smokeless tobacco: Never Used  . Alcohol use No     Comment: occasional      Allergies   Bee venom; Penicillins; Adhesive [tape]; Latex; and Vancomycin   Review of Systems Review of Systems  All other systems reviewed and are negative.    Physical Exam Updated Vital Signs BP 134/80 (BP Location: Right Arm)   Pulse 73   Temp 97.7 F (36.5 C) (Oral)   Resp 22   Ht 5\' 3"  (1.6 m)   Wt (!) 194.6 kg   LMP 03/08/2016   SpO2 100%   BMI 75.99 kg/m   Physical Exam Physical Exam  Constitutional: Pt appears well-developed and well-nourished. No distress.  HENT:  Head: Normocephalic and atraumatic.  Eyes: Conjunctivae are normal.  Neck: Normal range of motion.  Cardiovascular: Normal rate, regular rhythm and intact distal pulses.   Capillary refill < 3 sec  Pulmonary/Chest: Effort normal and breath sounds normal.  Musculoskeletal: Pt exhibits tenderness to palpation anteriorly, exam is limited by body habitus. Pt exhibits no edema.  ROM: 4/5 limited by pain  Neurological: Pt  is alert. Coordination normal.  Sensation 5/5  Strength 4/5 limited by pain  Skin: Skin is warm and dry. Pt is  not diaphoretic.  No tenting of the skin  Psychiatric: Pt has a normal mood and affect.  Nursing note and vitals reviewed.   ED Treatments / Results  Labs (all labs ordered are listed, but only abnormal results are displayed) Labs Reviewed - No data to display  EKG  EKG Interpretation None       Radiology Dg Knee Complete 4 Views Left  Result Date: 04/11/2016 CLINICAL DATA:  Left knee pain EXAM: LEFT KNEE - COMPLETE 4+ VIEW COMPARISON:  02/08/2012 FINDINGS: No acute fracture dislocation. Mild tricompartmental osteoarthritis of the left knee. Relative patella [place]. Small loose bodies in the posterior joint space. No significant joint effusion. No lytic or sclerotic osseous lesion. IMPRESSION: Mild tricompartmental osteoarthritis of the left knee. Electronically Signed   By: Kathreen Devoid   On: 04/11/2016 13:42    Procedures Procedures (including critical care time)  Medications Ordered in ED Medications - No data to display   Initial Impression / Assessment and Plan / ED Course  I have reviewed the triage vital signs and the nursing notes.  Pertinent labs & imaging results that were available during my care of the patient were reviewed by me and considered in my medical decision making (see chart for details).  Clinical Course     Patient X-Ray negative for obvious fracture or dislocation.  Pt advised to follow up with orthopedics. Patient given Ace wrap and crutches while in ED, conservative therapy recommended and discussed. Patient will be discharged home & is agreeable with above plan. Returns precautions discussed. Pt appears safe for discharge.   Final Clinical Impressions(s) / ED Diagnoses   Final diagnoses:  Acute pain of left knee    New Prescriptions Discharge Medication List as of 04/11/2016  1:50 PM       Montine Circle, PA-C 04/11/16 1406    Isla Pence, MD 04/11/16 1415

## 2016-04-11 NOTE — ED Triage Notes (Signed)
Patient states she "almost fell" last week and twisted her left knee. States she has had pain in left knee since injury. States pain is worse today.

## 2016-04-17 DIAGNOSIS — M545 Low back pain: Secondary | ICD-10-CM | POA: Diagnosis not present

## 2016-04-17 DIAGNOSIS — M9902 Segmental and somatic dysfunction of thoracic region: Secondary | ICD-10-CM | POA: Diagnosis not present

## 2016-04-17 DIAGNOSIS — M542 Cervicalgia: Secondary | ICD-10-CM | POA: Diagnosis not present

## 2016-04-17 DIAGNOSIS — M9901 Segmental and somatic dysfunction of cervical region: Secondary | ICD-10-CM | POA: Diagnosis not present

## 2016-04-17 DIAGNOSIS — M9903 Segmental and somatic dysfunction of lumbar region: Secondary | ICD-10-CM | POA: Diagnosis not present

## 2016-04-17 DIAGNOSIS — F332 Major depressive disorder, recurrent severe without psychotic features: Secondary | ICD-10-CM | POA: Diagnosis not present

## 2016-04-17 DIAGNOSIS — M546 Pain in thoracic spine: Secondary | ICD-10-CM | POA: Diagnosis not present

## 2016-05-06 DIAGNOSIS — F332 Major depressive disorder, recurrent severe without psychotic features: Secondary | ICD-10-CM | POA: Diagnosis not present

## 2016-05-14 DIAGNOSIS — R7301 Impaired fasting glucose: Secondary | ICD-10-CM | POA: Diagnosis not present

## 2016-05-14 DIAGNOSIS — F3341 Major depressive disorder, recurrent, in partial remission: Secondary | ICD-10-CM | POA: Diagnosis not present

## 2016-05-18 ENCOUNTER — Other Ambulatory Visit: Payer: Self-pay | Admitting: Nurse Practitioner

## 2016-05-20 DIAGNOSIS — F332 Major depressive disorder, recurrent severe without psychotic features: Secondary | ICD-10-CM | POA: Diagnosis not present

## 2016-06-11 DIAGNOSIS — F332 Major depressive disorder, recurrent severe without psychotic features: Secondary | ICD-10-CM | POA: Diagnosis not present

## 2016-07-02 DIAGNOSIS — M130 Polyarthritis, unspecified: Secondary | ICD-10-CM | POA: Diagnosis not present

## 2016-07-02 DIAGNOSIS — F3341 Major depressive disorder, recurrent, in partial remission: Secondary | ICD-10-CM | POA: Diagnosis not present

## 2016-07-19 ENCOUNTER — Ambulatory Visit (INDEPENDENT_AMBULATORY_CARE_PROVIDER_SITE_OTHER): Payer: Medicare HMO | Admitting: Adult Health

## 2016-07-19 ENCOUNTER — Encounter: Payer: Self-pay | Admitting: Adult Health

## 2016-07-19 VITALS — BP 138/70 | HR 76 | Ht 63.0 in | Wt >= 6400 oz

## 2016-07-19 DIAGNOSIS — O3680X0 Pregnancy with inconclusive fetal viability, not applicable or unspecified: Secondary | ICD-10-CM

## 2016-07-19 DIAGNOSIS — Z3490 Encounter for supervision of normal pregnancy, unspecified, unspecified trimester: Secondary | ICD-10-CM

## 2016-07-19 DIAGNOSIS — Z6841 Body Mass Index (BMI) 40.0 and over, adult: Secondary | ICD-10-CM

## 2016-07-19 DIAGNOSIS — O09521 Supervision of elderly multigravida, first trimester: Secondary | ICD-10-CM

## 2016-07-19 DIAGNOSIS — O09529 Supervision of elderly multigravida, unspecified trimester: Secondary | ICD-10-CM | POA: Insufficient documentation

## 2016-07-19 DIAGNOSIS — Z3201 Encounter for pregnancy test, result positive: Secondary | ICD-10-CM | POA: Diagnosis not present

## 2016-07-19 DIAGNOSIS — R110 Nausea: Secondary | ICD-10-CM | POA: Diagnosis not present

## 2016-07-19 DIAGNOSIS — N926 Irregular menstruation, unspecified: Secondary | ICD-10-CM

## 2016-07-19 LAB — POCT URINE PREGNANCY: PREG TEST UR: POSITIVE — AB

## 2016-07-19 MED ORDER — PROMETHAZINE HCL 25 MG PO TABS: 25.0000 mg | ORAL_TABLET | Freq: Four times a day (QID) | ORAL | 1 refills | Status: DC | PRN

## 2016-07-19 NOTE — Addendum Note (Signed)
Addended by: Derrek Monaco A on: 07/19/2016 12:10 PM   Modules accepted: Level of Service

## 2016-07-19 NOTE — Progress Notes (Signed)
Subjective:     Patient ID: Heather [name], female   DOB: 07-30-1975, 41 y.o.   MRN: [id number]  HPI Heather [name] is a 41 year old white female,married,  in for UPT, has had 2+HPT after missing a period, and has nausea and breast tenderness.She had ectopic 5 years ago.  PCP is Dr Criss Rosales and sees [facility].  Review of Systems +missed period with +HPT x 2  nausea Breast tenderness  Reviewed past medical,surgical, social and family history. Reviewed medications and allergies.     Objective:   Physical Exam BP 138/70 (BP Location: Left Arm, Patient Position: Sitting, Cuff Size: Normal)   Pulse 76   Ht 5\' 3"  (1.6 m)   Wt (!) 407 lb (184.6 kg)   LMP 05/18/2016 (Approximate)   BMI 72.10 kg/m UPT +, about 8+6 weeks by LMP, with EDD 02/22/17, Skin warm and dry. Neck: mid line trachea, normal thyroid, good ROM, no lymphadenopathy noted. Lungs: clear to ausculation bilaterally. Cardiovascular: regular rate and rhythm.Abdomen is soft and non tender and obese. Discussed will get [place] ASAP, will need to talk with Waymon Budge at [facility] about meds.   She is aware that baby already exposed to her meds, and that Geodon increases risk for cleft lip/palate.   Assessment:     1. Pregnancy examination or test, positive result   2. Pregnancy, unspecified gestational age   62. Encounter to determine fetal viability of pregnancy, single or unspecified fetus   4. Nausea   5. Elderly multigravida in first trimester   6. Morbid obesity with BMI of 70 and over, adult [facility])       Plan:     Return in 4 days for dating [place] Book given for pregnancy

## 2016-07-23 ENCOUNTER — Ambulatory Visit (INDEPENDENT_AMBULATORY_CARE_PROVIDER_SITE_OTHER): Payer: Medicare HMO

## 2016-07-23 ENCOUNTER — Other Ambulatory Visit: Payer: Self-pay | Admitting: Adult Health

## 2016-07-23 DIAGNOSIS — O3680X0 Pregnancy with inconclusive fetal viability, not applicable or unspecified: Secondary | ICD-10-CM

## 2016-07-23 DIAGNOSIS — O3680X2 Pregnancy with inconclusive fetal viability, fetus 2: Secondary | ICD-10-CM

## 2016-07-23 DIAGNOSIS — O3680X1 Pregnancy with inconclusive fetal viability, fetus 1: Secondary | ICD-10-CM | POA: Diagnosis not present

## 2016-07-23 NOTE — Progress Notes (Signed)
[place] 9+3 wks DI/DI twins,normal right ovary,left ovary not visualized BABY A:crl 27.0 mm,fhr 174 bpm BABY B:crl 26.5 mm,fhr 171 bpm EDD 02/22/2017

## 2016-07-30 ENCOUNTER — Ambulatory Visit (INDEPENDENT_AMBULATORY_CARE_PROVIDER_SITE_OTHER): Payer: Medicare HMO | Admitting: Allergy & Immunology

## 2016-07-30 ENCOUNTER — Encounter: Payer: Self-pay | Admitting: Allergy & Immunology

## 2016-07-30 VITALS — BP 120/70 | HR 83 | Temp 97.9°F

## 2016-07-30 DIAGNOSIS — J4531 Mild persistent asthma with (acute) exacerbation: Secondary | ICD-10-CM

## 2016-07-30 DIAGNOSIS — J0100 Acute maxillary sinusitis, unspecified: Secondary | ICD-10-CM | POA: Diagnosis not present

## 2016-07-30 DIAGNOSIS — J3089 Other allergic rhinitis: Secondary | ICD-10-CM

## 2016-07-30 MED ORDER — BUDESONIDE 32 MCG/ACT NA SUSP: 2.0000 | Freq: Every day | NASAL | 3 refills | Status: DC

## 2016-07-30 MED ORDER — BUDESONIDE 180 MCG/ACT IN AEPB: 2.0000 | INHALATION_SPRAY | Freq: Two times a day (BID) | RESPIRATORY_TRACT | 3 refills | Status: DC

## 2016-07-30 MED ORDER — MONTELUKAST SODIUM 10 MG PO TABS: 10.0000 mg | ORAL_TABLET | Freq: Every day | ORAL | 3 refills | Status: DC

## 2016-07-30 NOTE — Patient Instructions (Addendum)
1. Mild persistent asthma - complicated by pregnancy - Testing showed mild obstruction which is consistent with an asthma exacerbation. - We did give you a nebulizer treatment with improvement in symptoms. - We would like to avoid systemic steroids during pregnancy, therefore try increasing your Flovent for two weeks to four puffs twice daily. - We will also change you to Pulmicort 141mcg two puffs twice daily since this inhaled steroid has been deemed safe to use during pregnancy.  - Until we get this approved, continue with the Flovent that you already have. .- Daily controller medication(s): Pulmicort 168mcg two puffs twice daily + Singulair 10mg  daily - Rescue medications: ProAir 4 puffs every 4-6 hours as needed - Changes during respiratory infections or worsening symptoms: increase Pulmicort 151mcg to 4 puffs twice daily for TWO WEEKS. - Asthma control goals:  * Full participation in all desired activities (may need albuterol before activity) * Albuterol use two time or less a week on average (not counting use with activity) * Cough interfering with sleep two time or less a month * Oral steroids no more than once a year * No hospitalizations  2. Acute sinusitis in the setting of perennial allergic rhinitis - Try treating for now with nasal saline rinses 2-3 daily + pseudoephedrine120mg  twice daily. - Pseudoephedrine is class B during pregnancy.  - If this is not working by the end of the week, call us and we can send in an antibiotic to help get you through this. - Stop the Astelin nasal spray and start Rhinocort two sprays per nostril once daily. - Rhinocort (budesonide) is Class B during pregnancy.      3. Return in about 3 months (around 10/29/2016).  Please inform us of any Emergency Department visits, hospitalizations, or changes in symptoms. Call us before going to the ED for breathing or allergy symptoms since we might be able to fit you in for a sick visit. Feel free to  contact us anytime with any questions, problems, or concerns.  It was a pleasure to see you again today! Happy spring!   Websites that have reliable patient information: 1. American Academy of Asthma, Allergy, and Immunology: www.aaaai.org 2. Food Allergy Research and Education (FARE): foodallergy.org 3. Mothers of Asthmatics: http://www.asthmacommunitynetwork.org 4. American College of Allergy, Asthma, and Immunology: www.acaai.org

## 2016-07-30 NOTE — Progress Notes (Signed)
FOLLOW UP  Date of Service/Encounter:  07/30/16   Assessment:   Mild persistent asthma - complicated by pregnancy ([redacted]wks gestation)   Acute sinusitis  Perennial allergic rhinitis   Asthma Reportables:  Severity: mild persistent  Risk: low Control: well controlled   Plan/Recommendations:   1. Mild persistent asthma - complicated by pregnancy - Testing showed mild obstruction which is consistent with an asthma exacerbation, although much of the obstruction could be secondary to body habitus.  - Ms. Brenton did not want to get prednisone today, which is reasonable given the possible teratogenic effects at this age gestation.  - We did give one nebulizer treatment with improvement in symptoms. - Since we are attempting to avoid systemic steroids during pregnancy, we will try increasing the Flovent for two weeks to four puffs twice daily. - We will also change you to Pulmicort 19mg two puffs twice daily since this inhaled steroid has been deemed safe to use during pregnancy.  - Until we get this approved, continue with the Flovent that you already have. .- Daily controller medication(s): Pulmicort 1894m two puffs twice daily + Singulair 1014maily - Rescue medications: ProAir 4 puffs every 4-6 hours as needed - Changes during respiratory infections or worsening symptoms: increase Pulmicort 180m26mo 4 puffs twice daily for TWO WEEKS. - Asthma control goals:  * Full participation in all desired activities (may need albuterol before activity) * Albuterol use two time or less a week on average (not counting use with activity) * Cough interfering with sleep two time or less a month * Oral steroids no more than once a year * No hospitalizations  2. Acute sinusitis in the setting of perennial allergic rhinitis - Try treating for now with nasal saline rinses 2-3 daily + pseudoephedrine120mg69mce daily. - Pseudoephedrine is class B during pregnancy.  - If this is not working by the  end of the week, call us [place] can send in an antibiotic to help get you through this. - Stop the Astelin nasal spray and start Rhinocort two sprays per nostril once daily. - Rhinocort (budesonide) is Class B during pregnancy.   3. Return in about 3 months (around 10/29/2016).   Subjective:   RebecAzuree Minishe is a 41 y.35 female presenting today for follow up of  Chief Complaint  Patient presents with  . Allergies    C/O congestion and headache.     RebecLeeyahose has a history of the following: Patient Active Problem List   Diagnosis Date Noted  . Pregnancy 07/19/2016  . Nausea 07/19/2016  . Elderly multigravida in first trimester 07/19/2016  . Anxiety and depression 11/07/2015  . Hard to intubate 11/07/2015  . Hypoglycemia 11/07/2015  . OSA (obstructive sleep apnea) 11/07/2015  . DM type 2 (diabetes mellitus, type 2) (HCC) [place]31/2016  . Panniculitis 12/06/2014  . Cellulitis 11/11/2014  . Open wound 11/11/2014  . Sepsis (HCC) [place]05/2016  . Cellulitis, abdominal wall 11/11/2014  . Abdominal pain 07/14/2014  . Loose stools 07/14/2014  . Melena 01/27/2014  . Abdominal pain, lower 01/27/2014  . Eosinophilic esophagitis 07/1686/11/0315bdominal pain, epigastric 04/28/2013  . Bowel habit changes 04/28/2013  . Esophageal dysphagia 04/28/2013  . Insomnia due to mental disorder(327.02) 08/08/2011  . RLS (restless legs syndrome) 08/08/2011  . Adjustment disorder with depressed mood 08/06/2011  . Schizoaffective disorder, bipolar type (HCC) [place]26/2013    Class: Acute  . Preop cardiovascular exam 05/14/2011  . Depression   . Amenorrhea   .  Bipolar 1 disorder ([place])   . Dysmenorrhea   . PALPITATIONS, OCCASIONAL 11/01/2009  . Bipolar disorder ([place]) 10/19/2008  . DISORDER, TOBACCO USE 08/25/2008  . Leukocytosis 07/28/2008  . DEPRESSION/ANXIETY 07/28/2008  . ALLERGIC RHINITIS, SEASONAL 08/20/2007  . DYSMETABOLIC SYNDROME 35/00/9381  . Morbid obesity with BMI of 70 and over,  adult ([place]) 05/13/2006  . EXTERNAL HEMORRHOIDS 05/13/2006  . HYPERLIPIDEMIA 05/12/2006  . Essential hypertension 05/12/2006  . ASTHMA 05/12/2006  . GERD (gastroesophageal reflux disease) 05/12/2006  . OSTEOARTHRITIS 05/12/2006    History obtained from: chart review and patient.  Wells Guiles L Sean was referred by [name], MD.     Aundreya is a 41 y.o. female presenting for a follow up visit. She was last seen in December 2017. At that time, she had some mild restriction that improved with albuterol nebulizer treatment. We kept her on Flovent 110 g 2 puffs in the morning and 2 puffs in the evening as well as Singulair 10 mg daily. I did diagnose her with acute sinusitis and treated her with doxycycline 100 mg twice daily for 14 days. For her chronic allergic rhinitis, I continued her on Astelin as well as cetirizine 10 mg daily.  Since last visit, she has found out she is pregnant with twins. She is ten weeks along at this point. From an asthma perspective, she feels that she is tight in her upper chest. She has been coughing lately and spit up some mucous this morning. She has been having headaches. These symptoms have been going on the last couple of days. She has been using her rescue inhaler which does help for a short period of time. She has not tried any OTC medications because she is not certain what she can't take during pregnancy. She has not started any nasal saline rinses.   Asthma has otherwise been under good control. Negin's asthma has been well controlled. She has not required rescue medication, experienced nocturnal awakenings due to lower respiratory symptoms, nor have activities of daily living been limited. She has required no ED visits or UC visits for her breathing. She has not needed any prednisone bursts since the last visit to our clinic.   Ms. Lofland remains on a nasal spray but only uses this as needed, but has recently started using it. She has not had any fevers at  all. She is working every day at a childcare facility so she has definitely been around sick contacts. She also blames the abrupt changes in the weather.   Ms. Rawson does have a history of mental health problems including depression and anxiety. She is currently receiving disability due to these problems which she has been on since 1999. She met her current husband via groups she was participating in for her mental health disorders. Since the last visit, she   Otherwise, there have been no changes to her past medical history, surgical history, family history, or social history.     Review of Systems: a 14-point review of systems is pertinent for what is mentioned in HPI.  Otherwise, all other systems were negative. Constitutional: negative other than that listed in the HPI Eyes: negative other than that listed in the HPI Ears, nose, mouth, throat, and face: negative other than that listed in the HPI Respiratory: negative other than that listed in the HPI Cardiovascular: negative other than that listed in the HPI Gastrointestinal: negative other than that listed in the HPI Genitourinary: negative other than that listed in the HPI Integument: negative  other than that listed in the HPI Hematologic: negative other than that listed in the HPI Musculoskeletal: negative other than that listed in the HPI Neurological: negative other than that listed in the HPI Allergy/Immunologic: negative other than that listed in the HPI    Objective:   Blood pressure 120/70, pulse 83, temperature 97.9 F (36.6 C), temperature source Oral, last menstrual period 05/18/2016, SpO2 97 %. There is no height or weight on file to calculate BMI.   Physical Exam:  General: Alert, interactive, in no acute distress. Pleasant morbidly obese female.  Eyes: No conjunctival injection present on the right, No conjunctival injection present on the left, PERRL bilaterally, No discharge on the right, No discharge on the  left and No Horner-Trantas dots present Ears: Right TM pearly gray with normal light reflex, Left TM pearly gray with normal light reflex, Right TM intact without perforation and Left TM intact without perforation.  Nose/Throat: External nose within normal limits and septum midline, turbinates edematous and pale with clear discharge, post-pharynx erythematous with cobblestoning in the posterior oropharynx. Tonsils 2+ without exudates Neck: Supple without thyromegaly. Lungs: Decreased breath sounds bilaterally without wheezing, rhonchi or rales. No increased work of breathing. CV: Normal S1/S2, no murmurs. Capillary refill <2 seconds.  Skin: Warm and dry, without lesions or rashes. Neuro:   Grossly intact. No focal deficits appreciated. Responsive to questions.   Diagnostic studies:   Spirometry: results abnormal (FEV1: 1.91/69%, FVC: 3.05/95%, FEV1/FVC: 62%).    Spirometry consistent with mild obstructive disease. Albuterol/Atrovent nebulizer treatment given in clinic with significant improvement but only in the FEF25-75%. She did feel symptomatically improved with the nebulizer treatment.   Allergy Studies: none   Salvatore Marvel, MD [facility]

## 2016-07-31 DIAGNOSIS — F332 Major depressive disorder, recurrent severe without psychotic features: Secondary | ICD-10-CM | POA: Diagnosis not present

## 2016-08-06 ENCOUNTER — Ambulatory Visit (INDEPENDENT_AMBULATORY_CARE_PROVIDER_SITE_OTHER): Payer: Medicare HMO | Admitting: Women's Health

## 2016-08-06 ENCOUNTER — Encounter: Payer: Self-pay | Admitting: Women's Health

## 2016-08-06 ENCOUNTER — Ambulatory Visit: Payer: Medicare HMO | Admitting: *Deleted

## 2016-08-06 VITALS — BP 120/80 | HR 85 | Wt 398.6 lb

## 2016-08-06 DIAGNOSIS — IMO0001 Reserved for inherently not codable concepts without codable children: Secondary | ICD-10-CM

## 2016-08-06 DIAGNOSIS — O0991 Supervision of high risk pregnancy, unspecified, first trimester: Secondary | ICD-10-CM | POA: Diagnosis not present

## 2016-08-06 DIAGNOSIS — Z3682 Encounter for antenatal screening for nuchal translucency: Secondary | ICD-10-CM

## 2016-08-06 DIAGNOSIS — O24111 Pre-existing diabetes mellitus, type 2, in pregnancy, first trimester: Secondary | ICD-10-CM | POA: Diagnosis not present

## 2016-08-06 DIAGNOSIS — Z3A11 11 weeks gestation of pregnancy: Secondary | ICD-10-CM

## 2016-08-06 DIAGNOSIS — E669 Obesity, unspecified: Secondary | ICD-10-CM | POA: Diagnosis not present

## 2016-08-06 DIAGNOSIS — O30049 Twin pregnancy, dichorionic/diamniotic, unspecified trimester: Secondary | ICD-10-CM

## 2016-08-06 DIAGNOSIS — O30041 Twin pregnancy, dichorionic/diamniotic, first trimester: Secondary | ICD-10-CM | POA: Diagnosis not present

## 2016-08-06 DIAGNOSIS — O0990 Supervision of high risk pregnancy, unspecified, unspecified trimester: Secondary | ICD-10-CM | POA: Diagnosis not present

## 2016-08-06 DIAGNOSIS — Z331 Pregnant state, incidental: Secondary | ICD-10-CM

## 2016-08-06 DIAGNOSIS — Z6841 Body Mass Index (BMI) 40.0 and over, adult: Secondary | ICD-10-CM

## 2016-08-06 DIAGNOSIS — O24119 Pre-existing diabetes mellitus, type 2, in pregnancy, unspecified trimester: Secondary | ICD-10-CM

## 2016-08-06 DIAGNOSIS — Z1389 Encounter for screening for other disorder: Secondary | ICD-10-CM

## 2016-08-06 DIAGNOSIS — O30043 Twin pregnancy, dichorionic/diamniotic, third trimester: Secondary | ICD-10-CM | POA: Insufficient documentation

## 2016-08-06 NOTE — Patient Instructions (Signed)
Begin taking a 81mg  baby aspirin daily at 12 weeks of pregnancy (08/10/16) to decrease risk of preeclampsia during pregnancy   Nausea & Vomiting  Have saltine crackers or pretzels by your bed and eat a few bites before you raise your head out of bed in the morning  Eat small frequent meals throughout the day instead of large meals  Drink plenty of fluids throughout the day to stay hydrated, just don't drink a lot of fluids with your meals.  This can make your stomach fill up faster making you feel sick  Do not brush your teeth right after you eat  Products with real ginger are good for nausea, like ginger ale and ginger hard candy Make sure it says made with real ginger!  Sucking on sour candy like lemon heads is also good for nausea  If your prenatal vitamins make you nauseated, take them at night so you will sleep through the nausea  Sea Bands  If you feel like you need medicine for the nausea & vomiting please let us know  If you are unable to keep any fluids or food down please let us know   Constipation  Drink plenty of fluid, preferably water, throughout the day  Eat foods high in fiber such as fruits, vegetables, and grains  Exercise, such as walking, is a good way to keep your bowels regular  Drink warm fluids, especially warm prune juice, or decaf coffee  Eat a 1/2 cup of real oatmeal (not instant), 1/2 cup applesauce, and 1/2-1 cup warm prune juice every day  If needed, you may take Colace (docusate sodium) stool softener once or twice a day to help keep the stool soft. If you are pregnant, wait until you are out of your first trimester (12-14 weeks of pregnancy)  If you still are having problems with constipation, you may take Miralax once daily as needed to help keep your bowels regular.  If you are pregnant, wait until you are out of your first trimester (12-14 weeks of pregnancy)   First Trimester of Pregnancy The first trimester of pregnancy is from week 1 until  the end of week 12 (months 1 through 3). A week after a sperm fertilizes an egg, the egg will implant on the wall of the uterus. This embryo will begin to develop into a baby. Genes from you and your partner are forming the baby. The female genes determine whether the baby is a boy or a girl. At 6-8 weeks, the eyes and face are formed, and the heartbeat can be seen on ultrasound. At the end of 12 weeks, all the baby's organs are formed.  Now that you are pregnant, you will want to do everything you can to have a healthy baby. Two of the most important things are to get good prenatal care and to follow your health care provider's instructions. Prenatal care is all the medical care you receive before the baby's birth. This care will help prevent, find, and treat any problems during the pregnancy and childbirth. BODY CHANGES Your body goes through many changes during pregnancy. The changes vary from woman to woman.   You may gain or lose a couple of pounds at first.  You may feel sick to your stomach (nauseous) and throw up (vomit). If the vomiting is uncontrollable, call your health care provider.  You may tire easily.  You may develop headaches that can be relieved by medicines approved by your health care provider.  You may urinate  more often. Painful urination may mean you have a bladder infection.  You may develop heartburn as a result of your pregnancy.  You may develop constipation because certain hormones are causing the muscles that push waste through your intestines to slow down.  You may develop hemorrhoids or swollen, bulging veins (varicose veins).  Your breasts may begin to grow larger and become tender. Your nipples may stick out more, and the tissue that surrounds them (areola) may become darker.  Your gums may bleed and may be sensitive to brushing and flossing.  Dark spots or blotches (chloasma, mask of pregnancy) may develop on your face. This will likely fade after the baby is  born.  Your menstrual periods will stop.  You may have a loss of appetite.  You may develop cravings for certain kinds of food.  You may have changes in your emotions from day to day, such as being excited to be pregnant or being concerned that something may go wrong with the pregnancy and baby.  You may have more vivid and strange dreams.  You may have changes in your hair. These can include thickening of your hair, rapid growth, and changes in texture. Some women also have hair loss during or after pregnancy, or hair that feels dry or thin. Your hair will most likely return to normal after your baby is born. WHAT TO EXPECT AT YOUR PRENATAL VISITS During a routine prenatal visit:  You will be weighed to make sure you and the baby are growing normally.  Your blood pressure will be taken.  Your abdomen will be measured to track your baby's growth.  The fetal heartbeat will be listened to starting around week 10 or 12 of your pregnancy.  Test results from any previous visits will be discussed. Your health care provider may ask you:  How you are feeling.  If you are feeling the baby move.  If you have had any abnormal symptoms, such as leaking fluid, bleeding, severe headaches, or abdominal cramping.  If you have any questions. Other tests that may be performed during your first trimester include:  Blood tests to find your blood type and to check for the presence of any previous infections. They will also be used to check for low iron levels (anemia) and Rh antibodies. Later in the pregnancy, blood tests for diabetes will be done along with other tests if problems develop.  Urine tests to check for infections, diabetes, or protein in the urine.  An ultrasound to confirm the proper growth and development of the baby.  An amniocentesis to check for possible genetic problems.  Fetal screens for spina bifida and Down syndrome.  You may need other tests to make sure you and the  baby are doing well. HOME CARE INSTRUCTIONS  Medicines  Follow your health care provider's instructions regarding medicine use. Specific medicines may be either safe or unsafe to take during pregnancy.  Take your prenatal vitamins as directed.  If you develop constipation, try taking a stool softener if your health care provider approves. Diet  Eat regular, well-balanced meals. Choose a variety of foods, such as meat or vegetable-based protein, fish, milk and low-fat dairy products, vegetables, fruits, and whole grain breads and cereals. Your health care provider will help you determine the amount of weight gain that is right for you.  Avoid raw meat and uncooked cheese. These carry germs that can cause birth defects in the baby.  Eating four or five small meals rather than three  large meals a day may help relieve nausea and vomiting. If you start to feel nauseous, eating a few soda crackers can be helpful. Drinking liquids between meals instead of during meals also seems to help nausea and vomiting.  If you develop constipation, eat more high-fiber foods, such as fresh vegetables or fruit and whole grains. Drink enough fluids to keep your urine clear or pale yellow. Activity and Exercise  Exercise only as directed by your health care provider. Exercising will help you:  Control your weight.  Stay in shape.  Be prepared for labor and delivery.  Experiencing pain or cramping in the lower abdomen or low back is a good sign that you should stop exercising. Check with your health care provider before continuing normal exercises.  Try to avoid standing for long periods of time. Move your legs often if you must stand in one place for a long time.  Avoid heavy lifting.  Wear low-heeled shoes, and practice good posture.  You may continue to have sex unless your health care provider directs you otherwise. Relief of Pain or Discomfort  Wear a good support bra for breast tenderness.    Take warm sitz baths to soothe any pain or discomfort caused by hemorrhoids. Use hemorrhoid cream if your health care provider approves.   Rest with your legs elevated if you have leg cramps or low back pain.  If you develop varicose veins in your legs, wear support hose. Elevate your feet for 15 minutes, 3-4 times a day. Limit salt in your diet. Prenatal Care  Schedule your prenatal visits by the twelfth week of pregnancy. They are usually scheduled monthly at first, then more often in the last 2 months before delivery.  Write down your questions. Take them to your prenatal visits.  Keep all your prenatal visits as directed by your health care provider. Safety  Wear your seat belt at all times when driving.  Make a list of emergency phone numbers, including numbers for family, friends, the hospital, and police and fire departments. General Tips  Ask your health care provider for a referral to a local prenatal education class. Begin classes no later than at the beginning of month 6 of your pregnancy.  Ask for help if you have counseling or nutritional needs during pregnancy. Your health care provider can offer advice or refer you to specialists for help with various needs.  Do not use hot tubs, steam rooms, or saunas.  Do not douche or use tampons or scented sanitary pads.  Do not cross your legs for long periods of time.  Avoid cat litter boxes and soil used by cats. These carry germs that can cause birth defects in the baby and possibly loss of the fetus by miscarriage or stillbirth.  Avoid all smoking, herbs, alcohol, and medicines not prescribed by your health care provider. Chemicals in these affect the formation and growth of the baby.  Schedule a dentist appointment. At home, brush your teeth with a soft toothbrush and be gentle when you floss. SEEK MEDICAL CARE IF:   You have dizziness.  You have mild pelvic cramps, pelvic pressure, or nagging pain in the abdominal  area.  You have persistent nausea, vomiting, or diarrhea.  You have a bad smelling vaginal discharge.  You have pain with urination.  You notice increased swelling in your face, hands, legs, or ankles. SEEK IMMEDIATE MEDICAL CARE IF:   You have a fever.  You are leaking fluid from your vagina.  You  have spotting or bleeding from your vagina.  You have severe abdominal cramping or pain.  You have rapid weight gain or loss.  You vomit blood or material that looks like coffee grounds.  You are exposed to [place] measles and have never had them.  You are exposed to fifth disease or chickenpox.  You develop a severe headache.  You have shortness of breath.  You have any kind of trauma, such as from a fall or a car accident. Document Released: 03/19/2001 Document Revised: 08/09/2013 Document Reviewed: 02/02/2013 [facility] Patient Information 2015 [place], [place]. This information is not intended to replace advice given to you by your health care provider. Make sure you discuss any questions you have with your health care provider.

## 2016-08-06 NOTE — Progress Notes (Signed)
Subjective:  Heather [name] is a 41 y.o. G76P0010 Caucasian female at [redacted]w[redacted]d by LMP c/w 9wk u/s, being seen today for her first obstetrical visit.  Her obstetrical history is significant for di-di twin gestation, h/o ectopic pregnancy, morbid obesity w/ pre-gravid bmi 72, TypeII-ClassBDM on metformin 500mg  daily- states last A1C wnl- does not check sugars, depression/anxiety- on wellbutrin and buspar- sees [facility] q 4wks, on Lozol for dependent edema and requip for restless legs.  Lost 94lbs prior to pregnancy! Has CHTN listed on problem list, pt denies ever being dx w/ this, on no bp meds. Pregnancy history fully reviewed.  Patient reports no complaints. Denies vb, cramping, uti s/s, abnormal/malodorous vag d/c, or vulvovaginal itching/irritation.  BP 120/80   Pulse 85   Wt (!) 398 lb 9.6 oz (180.8 kg)   LMP 05/18/2016 (Exact Date)   BMI 70.61 kg/m   HISTORY: OB History  Gravida Para Term Preterm AB Living  2       1    SAB TAB Ectopic Multiple Live Births  0   1        # Outcome Date GA Lbr Len/2nd Weight Sex Delivery Anes PTL Lv  2 Current           1 Ectopic 05/2011             Past Medical History:  Diagnosis Date  . Amenorrhea   . Anxiety   . Anxiety   . Asthma   . COPD (chronic obstructive pulmonary disease) ([place])   . Depression   . Depression   . Diabetes mellitus without complication ([place])   . Dysmenorrhea   . Dysrhythmia    DR Johnsie Cancel    . Ectopic pregnancy 2013  . Eosinophilic esophagitis    Diagnosed at [facility] 06/16/2013, untreated  . GERD (gastroesophageal reflux disease)    HEARTBURN   TUMS  . Hard to intubate 11/07/2015  . Leukocytosis 07/28/2008   Qualifier: Diagnosis of  By: Jonna Munro MD, Roderic Scarce    . Morbid obesity ([place])   . Neuromuscular disorder (H[facility])    RESTLESS LEG   . Obesity   . Schizoaffective disorder, bipolar type ([place])   . Shortness of breath    WITH EXERTION   . Sleep apnea    CPAP   Past Surgical History:  Procedure Laterality Date   . CHOLECYSTECTOMY    . DENTAL SURGERY    . ESOPHAGOGASTRODUODENOSCOPY  May 2007   Dr. Gala Romney: Normal esophagus, stomach, D1, D2  . ESOPHAGOGASTRODUODENOSCOPY  06/16/2013   Dr. Carlton Adam, eosinophilic esophagitis, reactive gastropathy, no esophageal dilation  . TONSILLECTOMY    . TOOTH EXTRACTION  10/28/2011   Procedure: DENTAL RESTORATION/EXTRACTIONS;  Surgeon: Gae Bon, DDS;  Location: [facility] OR;  Service: Oral Surgery;;   Family History  Problem Relation Age of Onset  . Depression Mother   . Anxiety disorder Mother   . Hypertension Sister   . Allergic rhinitis Sister   . Colon polyps Maternal Grandmother     23s  . Diabetes Maternal Grandmother   . Anxiety disorder Maternal Grandmother   . COPD Maternal Grandmother   . Crohn's disease Maternal Aunt   . Cancer Maternal Grandfather     prostate  . HIV/AIDS Father   . Liver disease Neg Hx   . Angioedema Neg Hx   . Eczema Neg Hx   . Immunodeficiency Neg Hx   . Asthma Neg Hx   . Urticaria Neg Hx     Exam  System:     General: Well developed & nourished, no acute distress   Skin: Warm & dry, normal coloration and turgor, no rashes   Neurologic: Alert & oriented, normal mood   Cardiovascular: Regular rate & rhythm   Respiratory: Effort & rate normal, LCTAB, acyanotic   Abdomen: Soft, non tender   Extremities: normal strength, tone   Pelvic Exam:    Perineum: Normal perineum   Vulva: Normal, no lesions   Vagina:  Normal mucosa, normal discharge   Cervix: Normal, bulbous, appears closed   Uterus: Normal size/shape/contour for GA   Thin prep pap smear neg 04/2015  FHR: + via informal u/s x 2, active fetuses   Assessment:   Pregnancy: G2P0010 Patient Active Problem List   Diagnosis Date Noted  . Supervision of high risk pregnancy, antepartum 08/06/2016  . Pregnancy 07/19/2016  . Nausea 07/19/2016  . Elderly multigravida in first trimester 07/19/2016  . Anxiety and depression 11/07/2015  . Hard to intubate  11/07/2015  . Hypoglycemia 11/07/2015  . OSA (obstructive sleep apnea) 11/07/2015  . DM type 2 (diabetes mellitus, type 2) ([place]) 12/07/2014  . Panniculitis 12/06/2014  . Cellulitis 11/11/2014  . Open wound 11/11/2014  . Sepsis ([place]) 11/11/2014  . Cellulitis, abdominal wall 11/11/2014  . Abdominal pain 07/14/2014  . Loose stools 07/14/2014  . Melena 01/27/2014  . Abdominal pain, lower 01/27/2014  . Eosinophilic esophagitis 51/05/5850  . Abdominal pain, epigastric 04/28/2013  . Bowel habit changes 04/28/2013  . Esophageal dysphagia 04/28/2013  . Insomnia due to mental disorder(327.02) 08/08/2011  . RLS (restless legs syndrome) 08/08/2011  . Adjustment disorder with depressed mood 08/06/2011  . Schizoaffective disorder, bipolar type ([place]) 08/02/2011    Class: Acute  . Preop cardiovascular exam 05/14/2011  . Depression   . Amenorrhea   . Bipolar 1 disorder ([place])   . Dysmenorrhea   . PALPITATIONS, OCCASIONAL 11/01/2009  . Bipolar disorder ([place]) 10/19/2008  . DISORDER, TOBACCO USE 08/25/2008  . Leukocytosis 07/28/2008  . DEPRESSION/ANXIETY 07/28/2008  . ALLERGIC RHINITIS, SEASONAL 08/20/2007  . DYSMETABOLIC SYNDROME 77/82/4235  . Morbid obesity with BMI of 70 and over, adult ([place]) 05/13/2006  . EXTERNAL HEMORRHOIDS 05/13/2006  . HYPERLIPIDEMIA 05/12/2006  . Essential hypertension 05/12/2006  . ASTHMA 05/12/2006  . GERD (gastroesophageal reflux disease) 05/12/2006  . OSTEOARTHRITIS 05/12/2006    [redacted]w[redacted]d G2P0010 New OB visit Morbid obesity Di-Di twins TypeII- classBDM Depression/anxiety AMA  Plan:  Discussed all w/ LHE, no need for A1C, to begin checking sugars QID Dietician referral sent today, already sees [employer] for nutrition; bring log to each visit Baby ASA @ 12wks Initial labs obtained Continue prenatal vitamins Problem list reviewed and updated Reviewed n/v relief measures and warning s/s to report Reviewed recommended weight gain based on pre-gravid  BMI Encouraged well-balanced diet Genetic Screening discussed Integrated Screen: requested Cystic fibrosis screening discussed declined Ultrasound discussed; fetal survey: requested Follow up in 2 weeks for 1st IT/NT and visit w/ MD [place] completed Stop Lozol & requip Was unable to void, will try again before leaving  Tawnya Crook CNM, WHNP-BC 08/06/2016 11:15 AM

## 2016-08-07 LAB — URINALYSIS, ROUTINE W REFLEX MICROSCOPIC
Bilirubin, UA: NEGATIVE
Glucose, UA: NEGATIVE
KETONES UA: NEGATIVE
Nitrite, UA: POSITIVE — AB
SPEC GRAV UA: 1.021 (ref 1.005–1.030)
Urobilinogen, Ur: 0.2 mg/dL (ref 0.2–1.0)
pH, UA: 8.5 — ABNORMAL HIGH (ref 5.0–7.5)

## 2016-08-07 LAB — HIV ANTIBODY (ROUTINE TESTING W REFLEX): HIV Screen 4th Generation wRfx: NONREACTIVE

## 2016-08-07 LAB — COMPREHENSIVE METABOLIC PANEL
ALT: 37 IU/L — AB (ref 0–32)
AST: 22 IU/L (ref 0–40)
Albumin/Globulin Ratio: 1.2 (ref 1.2–2.2)
Albumin: 4 g/dL (ref 3.5–5.5)
Alkaline Phosphatase: 60 IU/L (ref 39–117)
BUN/Creatinine Ratio: 16 (ref 9–23)
BUN: 8 mg/dL (ref 6–20)
Bilirubin Total: 0.3 mg/dL (ref 0.0–1.2)
CALCIUM: 10.3 mg/dL — AB (ref 8.7–10.2)
CO2: 23 mmol/L (ref 18–29)
CREATININE: 0.5 mg/dL — AB (ref 0.57–1.00)
Chloride: 95 mmol/L — ABNORMAL LOW (ref 96–106)
GFR calc Af Amer: 142 mL/min/{1.73_m2} (ref >59)
GFR, EST NON AFRICAN AMERICAN: 123 mL/min/{1.73_m2} (ref >59)
GLOBULIN, TOTAL: 3.3 g/dL (ref 1.5–4.5)
Glucose: 81 mg/dL (ref 65–99)
Potassium: 4.2 mmol/L (ref 3.5–5.2)
Sodium: 135 mmol/L (ref 134–144)
Total Protein: 7.3 g/dL (ref 6.0–8.5)

## 2016-08-07 LAB — RUBELLA SCREEN: RUBELLA: 14 {index} (ref >0.99)

## 2016-08-07 LAB — ABO/RH: RH TYPE: POSITIVE

## 2016-08-07 LAB — CBC
Hematocrit: 36.6 % (ref 34.0–46.6)
Hemoglobin: 11.7 g/dL (ref 11.1–15.9)
MCH: 26.6 pg (ref 26.6–33.0)
MCHC: 32 g/dL (ref 31.5–35.7)
MCV: 83 fL (ref 79–97)
PLATELETS: 439 10*3/uL — AB (ref 150–379)
RBC: 4.4 x10E6/uL (ref 3.77–5.28)
RDW: 16.1 % — AB (ref 12.3–15.4)
WBC: 13.6 10*3/uL — ABNORMAL HIGH (ref 3.4–10.8)

## 2016-08-07 LAB — PMP SCREEN PROFILE (10S), URINE
Amphetamine Scrn, Ur: NEGATIVE ng/mL
BARBITURATE SCREEN URINE: NEGATIVE ng/mL
BENZODIAZEPINE SCREEN, URINE: NEGATIVE ng/mL
CANNABINOIDS UR QL SCN: NEGATIVE ng/mL
COCAINE(METAB.)SCREEN, URINE: NEGATIVE ng/mL
Creatinine(Crt), U: 152.5 mg/dL (ref 20.0–300.0)
Methadone Screen, Urine: NEGATIVE ng/mL
OXYCODONE+OXYMORPHONE UR QL SCN: NEGATIVE ng/mL
Opiate Scrn, Ur: NEGATIVE ng/mL
PH UR, DRUG SCRN: 8.9 (ref 4.5–8.9)
Phencyclidine Qn, Ur: NEGATIVE ng/mL
Propoxyphene Scrn, Ur: NEGATIVE ng/mL

## 2016-08-07 LAB — MICROSCOPIC EXAMINATION
Casts: NONE SEEN /lpf
WBC, UA: >30 /hpf — AB (ref 0–?)

## 2016-08-07 LAB — GC/CHLAMYDIA PROBE AMP
CHLAMYDIA, DNA PROBE: NEGATIVE
NEISSERIA GONORRHOEAE BY PCR: NEGATIVE

## 2016-08-07 LAB — ANTIBODY SCREEN: Antibody Screen: NEGATIVE

## 2016-08-07 LAB — HEPATITIS B SURFACE ANTIGEN: Hepatitis B Surface Ag: NEGATIVE

## 2016-08-07 LAB — RPR: RPR Ser Ql: NONREACTIVE

## 2016-08-07 LAB — VARICELLA ZOSTER ANTIBODY, IGG: Varicella zoster IgG: 967 index (ref >165)

## 2016-08-08 LAB — URINE CULTURE

## 2016-08-13 DIAGNOSIS — O30041 Twin pregnancy, dichorionic/diamniotic, first trimester: Secondary | ICD-10-CM | POA: Diagnosis not present

## 2016-08-14 ENCOUNTER — Encounter: Payer: Medicare HMO | Attending: Cardiology | Admitting: *Deleted

## 2016-08-14 DIAGNOSIS — O24111 Pre-existing diabetes mellitus, type 2, in pregnancy, first trimester: Secondary | ICD-10-CM

## 2016-08-14 DIAGNOSIS — Z713 Dietary counseling and surveillance: Secondary | ICD-10-CM | POA: Insufficient documentation

## 2016-08-14 DIAGNOSIS — O30001 Twin pregnancy, unspecified number of placenta and unspecified number of amniotic sacs, first trimester: Secondary | ICD-10-CM | POA: Insufficient documentation

## 2016-08-14 DIAGNOSIS — Z3A12 12 weeks gestation of pregnancy: Secondary | ICD-10-CM | POA: Diagnosis not present

## 2016-08-14 DIAGNOSIS — O9989 Other specified diseases and conditions complicating pregnancy, childbirth and the puerperium: Secondary | ICD-10-CM | POA: Insufficient documentation

## 2016-08-14 DIAGNOSIS — R7303 Prediabetes: Secondary | ICD-10-CM | POA: Insufficient documentation

## 2016-08-14 DIAGNOSIS — E1165 Type 2 diabetes mellitus with hyperglycemia: Secondary | ICD-10-CM

## 2016-08-14 NOTE — Progress Notes (Signed)
  Patient was seen on 08/14/2016 for pre-diabetes and possible Type 2 Diabetes with pregnancy acoording to referral from MD. Patient states her last A1c; 5.7%.  She states she lost 94 pounds prior to becoming pregnant with twins. She is currently 12.5 weeks. EDD is 02/22/2017. The following learning objectives were met by the patient :   States the definition of Type 2 Diabetes with pregnancy  States why dietary management is important in controlling blood glucose  Describes the effects of carbohydrates on blood glucose levels  Demonstrates ability to create a balanced meal plan  Demonstrates carbohydrate counting   States when to check blood glucose levels  Demonstrates proper blood glucose monitoring techniques  States the effect of stress and exercise on blood glucose levels  States the importance of limiting caffeine and abstaining from alcohol and smoking  Plan:  Aim for 3 Carb Choices per meal (45 grams) +/- 1 either way  Aim for 1-2 Carbs per snack Begin reading food labels for Total Carbohydrate of foods Consider  increasing your activity level by walking or other activity daily as tolerated Begin checking BG before breakfast and 2 hours after first bite of breakfast, lunch and dinner as directed by MD  Bring Log Book to every medical appointment   Take medication if directed by MD  Blood glucose monitor given: Accu Chek Guide Lot # [id number]  Exp: 08/26/2017 Blood glucose reading: 81 mg/dl  Patient instructed to monitor glucose levels: FBS: 60 - <90 2 hour: <120  Patient received the following handouts:  Nutrition Diabetes and Pregnancy  Carbohydrate Counting List  Patient will be seen for follow-up as needed.

## 2016-08-14 NOTE — Patient Instructions (Signed)
Plan:  Aim for 3 Carb Choices per meal (45 grams) +/- 1 either way  Aim for 0-2 Carbs per snack if hungry  Include protein in moderation with your meals and snacks Consider reading food labels for Total Carbohydrate of foods Continue with your activity level by walking and crossfit as tolerated Consider checking BG as we discussed as directed by MD  Let me know if you have any questions or concerns

## 2016-08-15 ENCOUNTER — Telehealth: Payer: Self-pay | Admitting: Women's Health

## 2016-08-15 NOTE — Telephone Encounter (Signed)
Accucheck guide strips, fast click drums called to [facility] to check BS four times per day with PRN refills.

## 2016-08-16 ENCOUNTER — Other Ambulatory Visit (HOSPITAL_COMMUNITY): Payer: Self-pay | Admitting: *Deleted

## 2016-08-19 ENCOUNTER — Other Ambulatory Visit (HOSPITAL_COMMUNITY): Payer: Self-pay | Admitting: Oncology

## 2016-08-19 ENCOUNTER — Encounter (HOSPITAL_COMMUNITY): Payer: Medicare HMO | Attending: Oncology

## 2016-08-19 DIAGNOSIS — D72829 Elevated white blood cell count, unspecified: Secondary | ICD-10-CM | POA: Insufficient documentation

## 2016-08-19 LAB — CBC WITH DIFFERENTIAL/PLATELET
Basophils Absolute: 0 10*3/uL (ref 0.0–0.1)
Basophils Relative: 0 %
EOS PCT: 3 %
Eosinophils Absolute: 0.3 10*3/uL (ref 0.0–0.7)
HCT: 35.6 % — ABNORMAL LOW (ref 36.0–46.0)
Hemoglobin: 11.2 g/dL — ABNORMAL LOW (ref 12.0–15.0)
LYMPHS ABS: 1.5 10*3/uL (ref 0.7–4.0)
Lymphocytes Relative: 14 %
MCH: 26.9 pg (ref 26.0–34.0)
MCHC: 31.5 g/dL (ref 30.0–36.0)
MCV: 85.4 fL (ref 78.0–100.0)
MONOS PCT: 6 %
Monocytes Absolute: 0.7 10*3/uL (ref 0.1–1.0)
Neutro Abs: 8.3 10*3/uL — ABNORMAL HIGH (ref 1.7–7.7)
Neutrophils Relative %: 77 %
PLATELETS: 319 10*3/uL (ref 150–400)
RBC: 4.17 MIL/uL (ref 3.87–5.11)
RDW: 16.5 % — AB (ref 11.5–15.5)
WBC: 10.8 10*3/uL — ABNORMAL HIGH (ref 4.0–10.5)

## 2016-08-21 ENCOUNTER — Other Ambulatory Visit: Payer: Self-pay | Admitting: Women's Health

## 2016-08-21 DIAGNOSIS — Z3682 Encounter for antenatal screening for nuchal translucency: Secondary | ICD-10-CM

## 2016-08-21 LAB — PATHOLOGIST SMEAR REVIEW

## 2016-08-22 ENCOUNTER — Encounter: Payer: Self-pay | Admitting: Obstetrics and Gynecology

## 2016-08-22 ENCOUNTER — Ambulatory Visit (INDEPENDENT_AMBULATORY_CARE_PROVIDER_SITE_OTHER): Payer: Medicare HMO

## 2016-08-22 ENCOUNTER — Ambulatory Visit (INDEPENDENT_AMBULATORY_CARE_PROVIDER_SITE_OTHER): Payer: Medicare HMO | Admitting: Obstetrics and Gynecology

## 2016-08-22 VITALS — BP 122/78 | HR 78 | Wt >= 6400 oz

## 2016-08-22 DIAGNOSIS — Z6841 Body Mass Index (BMI) 40.0 and over, adult: Secondary | ICD-10-CM

## 2016-08-22 DIAGNOSIS — Z331 Pregnant state, incidental: Secondary | ICD-10-CM

## 2016-08-22 DIAGNOSIS — Z3682 Encounter for antenatal screening for nuchal translucency: Secondary | ICD-10-CM

## 2016-08-22 DIAGNOSIS — O30041 Twin pregnancy, dichorionic/diamniotic, first trimester: Secondary | ICD-10-CM

## 2016-08-22 DIAGNOSIS — O99211 Obesity complicating pregnancy, first trimester: Secondary | ICD-10-CM

## 2016-08-22 DIAGNOSIS — O0990 Supervision of high risk pregnancy, unspecified, unspecified trimester: Secondary | ICD-10-CM

## 2016-08-22 DIAGNOSIS — O30049 Twin pregnancy, dichorionic/diamniotic, unspecified trimester: Secondary | ICD-10-CM

## 2016-08-22 DIAGNOSIS — O0991 Supervision of high risk pregnancy, unspecified, first trimester: Secondary | ICD-10-CM

## 2016-08-22 DIAGNOSIS — Z1389 Encounter for screening for other disorder: Secondary | ICD-10-CM

## 2016-08-22 LAB — POCT URINALYSIS DIPSTICK
Glucose, UA: NEGATIVE
Ketones, UA: NEGATIVE
NITRITE UA: NEGATIVE
RBC UA: NEGATIVE

## 2016-08-22 NOTE — Progress Notes (Signed)
Patient ID: Heather [name], female   DOB: Jul 04, 1975, 41 y.o.   MRN: [id number]   High Risk Pregnancy HROB Diagnosis(es):   Di-Di twins, Type 2/Class B DM  G2P0010 [redacted]w[redacted]d Estimated Date of Delivery: 02/22/17     HPI: The patient is being seen today for ongoing management of the above.  Chief Complaint  Patient presents with  . Routine Prenatal Visit  ____  Today she reports she is doing well.   Patient reports good fetal movement. She denies any bleeding and no rupture of membranes symptoms or regular contractions.  BP weight and urine results reviewed and noted. Last menstrual period 05/18/2016.  Fetal Heart rate:  A: 154 bpm, B: 159 bpm  Physical Examination: Abdomen - soft, nontender, nondistended, no masses or organomegaly                                     Edema:  none  Urinalysis: POSITIVE for trace protein, 2+ leukocytes   Fetal Surveillance Testing today:  NT/IT  Lab and sonogram results have been reviewed.  Assessment:  1.  Pregnancy at [redacted]w[redacted]d,  G2P0010   :  Estimated Date of Delivery: 02/22/17                         2.  Di-Di twins                        3. Type 2/Class B DM; CBG log reviewed with excellent control   Medication(s) Plans:  Baby ASA  Treatment Plan:  2x weekly NST @ 32w   Follow up in 2 weeks for appointment for high risk OB care   By signing my name below, I, Hansel Feinstein, attest that this documentation has been prepared under the direction and in the presence of Jonnie Kind, MD. Electronically Signed: Hansel Feinstein, ED Scribe. 08/22/16. 11:58 AM.  I personally performed the services described in this documentation, which was SCRIBED in my presence. The recorded information has been reviewed and considered accurate. It has been edited as necessary during review. Jonnie Kind, MD

## 2016-08-22 NOTE — Progress Notes (Signed)
[place] 13+5 wks,DI/DI twins,ovaries not visualized  BABY A: inferior,ant pl gr 0 crl 68.8 mm,fhr 154 bpm,NB present,NT 1.7 mm BABY B: superior,ant pl gr 0,fhr 159 bpm,crl 70.6 mm,NB present,NT 1.6 mm

## 2016-08-27 DIAGNOSIS — F339 Major depressive disorder, recurrent, unspecified: Secondary | ICD-10-CM | POA: Diagnosis not present

## 2016-08-27 LAB — MATERNAL SCREEN, INTEGRATED #1
Crown Rump Length Twin B: 70.6 mm
Crown Rump Length: 68.8 mm
GEST. AGE ON COLLECTION DATE: 13 wk
Maternal Age at EDD: 39.1 yr
NT TWIN B: 1.6 mm
Nuchal Translucency (NT): 1.7 mm
Number of Fetuses: 2
PAPP-A VALUE: 1244.5 ng/mL
Weight: 411 [lb_av]

## 2016-09-04 ENCOUNTER — Ambulatory Visit: Payer: Self-pay | Admitting: Nutrition

## 2016-09-05 ENCOUNTER — Encounter: Payer: Self-pay | Admitting: Women's Health

## 2016-09-05 ENCOUNTER — Ambulatory Visit (INDEPENDENT_AMBULATORY_CARE_PROVIDER_SITE_OTHER): Payer: Medicare HMO | Admitting: Women's Health

## 2016-09-05 VITALS — BP 124/74 | HR 80 | Wt >= 6400 oz

## 2016-09-05 DIAGNOSIS — Z1389 Encounter for screening for other disorder: Secondary | ICD-10-CM

## 2016-09-05 DIAGNOSIS — Z3682 Encounter for antenatal screening for nuchal translucency: Secondary | ICD-10-CM

## 2016-09-05 DIAGNOSIS — O0992 Supervision of high risk pregnancy, unspecified, second trimester: Secondary | ICD-10-CM

## 2016-09-05 DIAGNOSIS — Z1379 Encounter for other screening for genetic and chromosomal anomalies: Secondary | ICD-10-CM | POA: Diagnosis not present

## 2016-09-05 DIAGNOSIS — O24912 Unspecified diabetes mellitus in pregnancy, second trimester: Secondary | ICD-10-CM

## 2016-09-05 DIAGNOSIS — F419 Anxiety disorder, unspecified: Secondary | ICD-10-CM

## 2016-09-05 DIAGNOSIS — Z331 Pregnant state, incidental: Secondary | ICD-10-CM

## 2016-09-05 DIAGNOSIS — O24919 Unspecified diabetes mellitus in pregnancy, unspecified trimester: Secondary | ICD-10-CM

## 2016-09-05 DIAGNOSIS — Z363 Encounter for antenatal screening for malformations: Secondary | ICD-10-CM

## 2016-09-05 DIAGNOSIS — O30042 Twin pregnancy, dichorionic/diamniotic, second trimester: Secondary | ICD-10-CM

## 2016-09-05 DIAGNOSIS — O30049 Twin pregnancy, dichorionic/diamniotic, unspecified trimester: Secondary | ICD-10-CM

## 2016-09-05 DIAGNOSIS — Z3402 Encounter for supervision of normal first pregnancy, second trimester: Secondary | ICD-10-CM | POA: Diagnosis not present

## 2016-09-05 DIAGNOSIS — F329 Major depressive disorder, single episode, unspecified: Secondary | ICD-10-CM

## 2016-09-05 NOTE — Patient Instructions (Addendum)
Begin taking a 81mg  baby aspirin daily to decrease risk of preeclampsia during pregnancy   Second Trimester of Pregnancy The second trimester is from week 14 through week 27 (months 4 through 6). The second trimester is often a time when you feel your best. Your body has adjusted to being pregnant, and you begin to feel better physically. Usually, morning sickness has lessened or quit completely, you may have more energy, and you may have an increase in appetite. The second trimester is also a time when the fetus is growing rapidly. At the end of the sixth month, the fetus is about 9 inches long and weighs about 1 pounds. You will likely begin to feel the baby move (quickening) between 16 and 20 weeks of pregnancy. Body changes during your second trimester Your body continues to go through many changes during your second trimester. The changes vary from woman to woman.  Your weight will continue to increase. You will notice your lower abdomen bulging out.  You may begin to get stretch marks on your hips, abdomen, and breasts.  You may develop headaches that can be relieved by medicines. The medicines should be approved by your health care provider.  You may urinate more often because the fetus is pressing on your bladder.  You may develop or continue to have heartburn as a result of your pregnancy.  You may develop constipation because certain hormones are causing the muscles that push waste through your intestines to slow down.  You may develop hemorrhoids or swollen, bulging veins (varicose veins).  You may have back pain. This is caused by: ? Weight gain. ? Pregnancy hormones that are relaxing the joints in your pelvis. ? A shift in weight and the muscles that support your balance.  Your breasts will continue to grow and they will continue to become tender.  Your gums may bleed and may be sensitive to brushing and flossing.  Dark spots or blotches (chloasma, mask of pregnancy) may  develop on your face. This will likely fade after the baby is born.  A dark line from your belly button to the pubic area (linea nigra) may appear. This will likely fade after the baby is born.  You may have changes in your hair. These can include thickening of your hair, rapid growth, and changes in texture. Some women also have hair loss during or after pregnancy, or hair that feels dry or thin. Your hair will most likely return to normal after your baby is born.  What to expect at prenatal visits During a routine prenatal visit:  You will be weighed to make sure you and the fetus are growing normally.  Your blood pressure will be taken.  Your abdomen will be measured to track your baby's growth.  The fetal heartbeat will be listened to.  Any test results from the previous visit will be discussed.  Your health care provider may ask you:  How you are feeling.  If you are feeling the baby move.  If you have had any abnormal symptoms, such as leaking fluid, bleeding, severe headaches, or abdominal cramping.  If you are using any tobacco products, including cigarettes, chewing tobacco, and electronic cigarettes.  If you have any questions.  Other tests that may be performed during your second trimester include:  Blood tests that check for: ? Low iron levels (anemia). ? High blood sugar that affects pregnant women (gestational diabetes) between 61 and 28 weeks. ? Rh antibodies. This is to check for a  protein on red blood cells (Rh factor).  Urine tests to check for infections, diabetes, or protein in the urine.  An ultrasound to confirm the proper growth and development of the baby.  An amniocentesis to check for possible genetic problems.  Fetal screens for spina bifida and Down syndrome.  HIV (human immunodeficiency virus) testing. Routine prenatal testing includes screening for HIV, unless you choose not to have this test.  Follow these instructions at  home: Medicines  Follow your health care provider's instructions regarding medicine use. Specific medicines may be either safe or unsafe to take during pregnancy.  Take a prenatal vitamin that contains at least 600 micrograms (mcg) of folic acid.  If you develop constipation, try taking a stool softener if your health care provider approves. Eating and drinking  Eat a balanced diet that includes fresh fruits and vegetables, whole grains, good sources of protein such as meat, eggs, or tofu, and low-fat dairy. Your health care provider will help you determine the amount of weight gain that is right for you.  Avoid raw meat and uncooked cheese. These carry germs that can cause birth defects in the baby.  If you have low calcium intake from food, talk to your health care provider about whether you should take a daily calcium supplement.  Limit foods that are high in fat and processed sugars, such as fried and sweet foods.  To prevent constipation: ? Drink enough fluid to keep your urine clear or pale yellow. ? Eat foods that are high in fiber, such as fresh fruits and vegetables, whole grains, and beans. Activity  Exercise only as directed by your health care provider. Most women can continue their usual exercise routine during pregnancy. Try to exercise for 30 minutes at least 5 days a week. Stop exercising if you experience uterine contractions.  Avoid heavy lifting, wear low heel shoes, and practice good posture.  A sexual relationship may be continued unless your health care provider directs you otherwise. Relieving pain and discomfort  Wear a good support bra to prevent discomfort from breast tenderness.  Take warm sitz baths to soothe any pain or discomfort caused by hemorrhoids. Use hemorrhoid cream if your health care provider approves.  Rest with your legs elevated if you have leg cramps or low back pain.  If you develop varicose veins, wear support hose. Elevate your feet  for 15 minutes, 3-4 times a day. Limit salt in your diet. Prenatal Care  Write down your questions. Take them to your prenatal visits.  Keep all your prenatal visits as told by your health care provider. This is important. Safety  Wear your seat belt at all times when driving.  Make a list of emergency phone numbers, including numbers for family, friends, the hospital, and police and fire departments. General instructions  Ask your health care provider for a referral to a local prenatal education class. Begin classes no later than the beginning of month 6 of your pregnancy.  Ask for help if you have counseling or nutritional needs during pregnancy. Your health care provider can offer advice or refer you to specialists for help with various needs.  Do not use hot tubs, steam rooms, or saunas.  Do not douche or use tampons or scented sanitary pads.  Do not cross your legs for long periods of time.  Avoid cat litter boxes and soil used by cats. These carry germs that can cause birth defects in the baby and possibly loss of the fetus by miscarriage  or stillbirth.  Avoid all smoking, herbs, alcohol, and unprescribed drugs. Chemicals in these products can affect the formation and growth of the baby.  Do not use any products that contain nicotine or tobacco, such as cigarettes and e-cigarettes. If you need help quitting, ask your health care provider.  Visit your dentist if you have not gone yet during your pregnancy. Use a soft toothbrush to brush your teeth and be gentle when you floss. Contact a health care provider if:  You have dizziness.  You have mild pelvic cramps, pelvic pressure, or nagging pain in the abdominal area.  You have persistent nausea, vomiting, or diarrhea.  You have a bad smelling vaginal discharge.  You have pain when you urinate. Get help right away if:  You have a fever.  You are leaking fluid from your vagina.  You have spotting or bleeding from your  vagina.  You have severe abdominal cramping or pain.  You have rapid weight gain or weight loss.  You have shortness of breath with chest pain.  You notice sudden or extreme swelling of your face, hands, ankles, feet, or legs.  You have not felt your baby move in over an hour.  You have severe headaches that do not go away when you take medicine.  You have vision changes. Summary  The second trimester is from week 14 through week 27 (months 4 through 6). It is also a time when the fetus is growing rapidly.  Your body goes through many changes during pregnancy. The changes vary from woman to woman.  Avoid all smoking, herbs, alcohol, and unprescribed drugs. These chemicals affect the formation and growth your baby.  Do not use any tobacco products, such as cigarettes, chewing tobacco, and e-cigarettes. If you need help quitting, ask your health care provider.  Contact your health care provider if you have any questions. Keep all prenatal visits as told by your health care provider. This is important. This information is not intended to replace advice given to you by your health care provider. Make sure you discuss any questions you have with your health care provider. Document Released: 03/19/2001 Document Revised: 08/31/2015 Document Reviewed: 05/26/2012 Elsevier Interactive Patient Education  2017 [employer].

## 2016-09-05 NOTE — Progress Notes (Signed)
High Risk Pregnancy Diagnosis(es): Di-Di Twins, Type2/ClassBDM, AMA, morbid obesity G2P0010 [redacted]w[redacted]d Estimated Date of Delivery: 02/22/17 BP 124/74   Pulse 80   Wt (!) 412 lb 12.8 oz (187.2 kg)   LMP 05/18/2016 (Exact Date)   BMI 73.12 kg/m   Urinalysis: unable to catch urine in cup HPI:  Itching and rash on Rt posterior forearm and Rt lower leg, not on palms/soles or anything on Lt side of body. Unsure of anything only the Rt side of body touches. Has been using some steroid cream she had at home, helps some.  Forgot to bring log- reports all cbg's wnl Has not started baby ASA BP, weight, reviewed.  Voided- but missed cup- needs nuns hat for future visits. No fm yet. Denies cramping, lof, vb, uti s/s.   Fundal Height:  15wk twins Fetal Heart rate:  + x 2 via informal transabdominal u/s, both babies active Edema:  Trace Skin: fine red bumps w/ some excoriation from scratching on Rt post forearm and Rt lower leg  Reviewed warning s/s to report. Bring log to each appt! Can continue using cream on itchy/rash areas, cool showers/wash cloths, benadryl All questions were answered Assessment: [redacted]w[redacted]d Di-Di Twins, Type2/ClassB DM, AMA, morbid obesity Medication(s) Plans:  Start baby asa asap, continue metformin 500mg  daily Treatment Plan:  Growth u/s @ 20, 24, 28, 32, 35, 38wks      Fetal echo 24-28wks  2x/wk testing @ 32wks      Deliver @ 38wks Follow up in 3wks for high-risk OB appt and anatomy u/s 2nd IT today

## 2016-09-10 LAB — MATERNAL SCREEN, INTEGRATED #2
AFP MOM: 3.46
Alpha-Fetoprotein: 30.3 ng/mL
CROWN RUMP LENGTH TWIN B: 70.6 mm
Crown Rump Length: 68.8 mm
DIA MoM: 1.69
DIA Value: 186.4 pg/mL
ESTRIOL UNCONJUGATED: 0.71 ng/mL
GEST. AGE ON COLLECTION DATE: 13 wk
Gestational Age: 15 weeks
Maternal Age at EDD: 39.1 yr
NT MoM Twin B: 0.92
NT Twin B: 1.6 mm
NUCHAL TRANSLUCENCY (NT): 1.7 mm
NUMBER OF FETUSES: 2
Nuchal Translucency MoM: 1
PAPP-A MoM: 4.42
PAPP-A Value: 1244.5 ng/mL
Weight: 411 [lb_av]
Weight: 411 [lb_av]
hCG MoM: 1.86
hCG Value: 30.5 IU/mL
uE3 MoM: 1.78

## 2016-09-18 ENCOUNTER — Inpatient Hospital Stay (HOSPITAL_COMMUNITY)
Admission: AD | Admit: 2016-09-18 | Discharge: 2016-09-18 | Disposition: A | Discharge status: Home / Self Care | Payer: Medicare HMO | Source: Ambulatory Visit | Attending: Obstetrics & Gynecology | Admitting: Obstetrics & Gynecology

## 2016-09-18 ENCOUNTER — Encounter (HOSPITAL_COMMUNITY): Payer: Self-pay | Admitting: *Deleted

## 2016-09-18 DIAGNOSIS — Z881 Allergy status to other antibiotic agents status: Secondary | ICD-10-CM | POA: Insufficient documentation

## 2016-09-18 DIAGNOSIS — O99712 Diseases of the skin and subcutaneous tissue complicating pregnancy, second trimester: Secondary | ICD-10-CM | POA: Insufficient documentation

## 2016-09-18 DIAGNOSIS — G4730 Sleep apnea, unspecified: Secondary | ICD-10-CM | POA: Diagnosis not present

## 2016-09-18 DIAGNOSIS — R21 Rash and other nonspecific skin eruption: Secondary | ICD-10-CM | POA: Insufficient documentation

## 2016-09-18 DIAGNOSIS — Z7984 Long term (current) use of oral hypoglycemic drugs: Secondary | ICD-10-CM | POA: Insufficient documentation

## 2016-09-18 DIAGNOSIS — K219 Gastro-esophageal reflux disease without esophagitis: Secondary | ICD-10-CM | POA: Diagnosis not present

## 2016-09-18 DIAGNOSIS — O0990 Supervision of high risk pregnancy, unspecified, unspecified trimester: Secondary | ICD-10-CM

## 2016-09-18 DIAGNOSIS — Z880 Allergy status to penicillin: Secondary | ICD-10-CM | POA: Insufficient documentation

## 2016-09-18 DIAGNOSIS — Z87891 Personal history of nicotine dependence: Secondary | ICD-10-CM | POA: Diagnosis not present

## 2016-09-18 DIAGNOSIS — Z3A17 17 weeks gestation of pregnancy: Secondary | ICD-10-CM

## 2016-09-18 DIAGNOSIS — F329 Major depressive disorder, single episode, unspecified: Secondary | ICD-10-CM | POA: Diagnosis not present

## 2016-09-18 DIAGNOSIS — O99352 Diseases of the nervous system complicating pregnancy, second trimester: Secondary | ICD-10-CM | POA: Diagnosis not present

## 2016-09-18 DIAGNOSIS — Z79899 Other long term (current) drug therapy: Secondary | ICD-10-CM | POA: Diagnosis not present

## 2016-09-18 DIAGNOSIS — O99212 Obesity complicating pregnancy, second trimester: Secondary | ICD-10-CM | POA: Diagnosis not present

## 2016-09-18 DIAGNOSIS — O99342 Other mental disorders complicating pregnancy, second trimester: Secondary | ICD-10-CM | POA: Insufficient documentation

## 2016-09-18 DIAGNOSIS — O24912 Unspecified diabetes mellitus in pregnancy, second trimester: Secondary | ICD-10-CM | POA: Diagnosis not present

## 2016-09-18 DIAGNOSIS — W57XXXA Bitten or stung by nonvenomous insect and other nonvenomous arthropods, initial encounter: Secondary | ICD-10-CM | POA: Diagnosis not present

## 2016-09-18 DIAGNOSIS — O99612 Diseases of the digestive system complicating pregnancy, second trimester: Secondary | ICD-10-CM | POA: Diagnosis not present

## 2016-09-18 DIAGNOSIS — F419 Anxiety disorder, unspecified: Secondary | ICD-10-CM | POA: Diagnosis not present

## 2016-09-18 DIAGNOSIS — L03115 Cellulitis of right lower limb: Secondary | ICD-10-CM | POA: Diagnosis not present

## 2016-09-18 LAB — CBC WITH DIFFERENTIAL/PLATELET
Basophils Absolute: 0 10*3/uL (ref 0.0–0.1)
Basophils Relative: 0 %
EOS ABS: 0.6 10*3/uL (ref 0.0–0.7)
EOS PCT: 5 %
HCT: 34.3 % — ABNORMAL LOW (ref 36.0–46.0)
Hemoglobin: 10.7 g/dL — ABNORMAL LOW (ref 12.0–15.0)
LYMPHS PCT: 15 %
Lymphs Abs: 2 10*3/uL (ref 0.7–4.0)
MCH: 26.6 pg (ref 26.0–34.0)
MCHC: 31.2 g/dL (ref 30.0–36.0)
MCV: 85.3 fL (ref 78.0–100.0)
MONO ABS: 0.4 10*3/uL (ref 0.1–1.0)
MONOS PCT: 3 %
Neutro Abs: 10.4 10*3/uL — ABNORMAL HIGH (ref 1.7–7.7)
Neutrophils Relative %: 77 %
PLATELETS: 319 10*3/uL (ref 150–400)
RBC: 4.02 MIL/uL (ref 3.87–5.11)
RDW: 16.5 % — AB (ref 11.5–15.5)
WBC: 13.5 10*3/uL — AB (ref 4.0–10.5)

## 2016-09-18 MED ORDER — CLINDAMYCIN HCL 300 MG PO CAPS: 600.0000 mg | ORAL_CAPSULE | Freq: Three times a day (TID) | ORAL | 0 refills | Status: AC

## 2016-09-18 MED ORDER — SILVER SULFADIAZINE 1 % EX CREA: TOPICAL_CREAM | CUTANEOUS | 1 refills | Status: DC

## 2016-09-18 MED ORDER — PERMETHRIN 5 % EX CREA: 1.0000 "application " | TOPICAL_CREAM | Freq: Once | CUTANEOUS | 0 refills | Status: AC

## 2016-09-18 NOTE — MAU Provider Note (Signed)
History     CSN: [id number]  Arrival date and time: 09/18/16 1620   None     Chief Complaint  Patient presents with  . Rash   G2P0010 @17  weeks here with pruritic rash on arms, legs, chest, and upper abdomen. Sx started 3 weeks ago. She was seen in clinic and recommended Hydrocortisone cream and cool packs but this hasn't helped. No pregnancy complaints, no VB or pain. She reports itching is worse at night. She sleeps on a mattress which she bought used and isn't sure of the age.    OB History    Gravida Para Term Preterm AB Living   2       1     SAB TAB Ectopic Multiple Live Births   0   1          Past Medical History:  Diagnosis Date  . Amenorrhea   . Anxiety   . Anxiety   . Asthma   . COPD (chronic obstructive pulmonary disease) ([place])   . Depression   . Depression   . Diabetes mellitus without complication ([place])   . Dysmenorrhea   . Dysrhythmia    DR Johnsie Cancel    . Ectopic pregnancy 2013  . Eosinophilic esophagitis    Diagnosed at [facility] 06/16/2013, untreated  . GERD (gastroesophageal reflux disease)    HEARTBURN   TUMS  . Hard to intubate 11/07/2015  . Leukocytosis 07/28/2008   Qualifier: Diagnosis of  By: Jonna Munro MD, Roderic Scarce    . Morbid obesity ([place])   . Neuromuscular disorder ([facility])    RESTLESS LEG   . Obesity   . Schizoaffective disorder, bipolar type ([place])   . Shortness of breath    WITH EXERTION   . Sleep apnea    CPAP    Past Surgical History:  Procedure Laterality Date  . CHOLECYSTECTOMY    . DENTAL SURGERY    . ESOPHAGOGASTRODUODENOSCOPY  May 2007   Dr. Gala Romney: Normal esophagus, stomach, D1, D2  . ESOPHAGOGASTRODUODENOSCOPY  06/16/2013   Dr. Carlton Adam, eosinophilic esophagitis, reactive gastropathy, no esophageal dilation  . TONSILLECTOMY    . TOOTH EXTRACTION  10/28/2011   Procedure: DENTAL RESTORATION/EXTRACTIONS;  Surgeon: Gae Bon, DDS;  Location: [facility] OR;  Service: Oral Surgery;;    Family History  Problem Relation Age of Onset    . Depression Mother   . Anxiety disorder Mother   . Hypertension Sister   . Allergic rhinitis Sister   . Colon polyps Maternal Grandmother        53s  . Diabetes Maternal Grandmother   . Anxiety disorder Maternal Grandmother   . COPD Maternal Grandmother   . Crohn's disease Maternal Aunt   . Cancer Maternal Grandfather        prostate  . HIV/AIDS Father   . Liver disease Neg Hx   . Angioedema Neg Hx   . Eczema Neg Hx   . Immunodeficiency Neg Hx   . Asthma Neg Hx   . Urticaria Neg Hx     Social History  Substance Use Topics  . Smoking status: Former Smoker    Packs/day: 0.50    Years: 8.00    Types: Cigarettes    Quit date: 04/25/2011  . Smokeless tobacco: Never Used  . Alcohol use No    Allergies:  Allergies  Allergen Reactions  . Bee Venom Shortness Of Breath  . Penicillins Anaphylaxis    Has patient had a PCN reaction causing immediate rash, facial/tongue/throat swelling, SOB  or lightheadedness with hypotension: No Has patient had a PCN reaction causing severe rash involving mucus membranes or skin necrosis: No Has patient had a PCN reaction that required hospitalization No Has patient had a PCN reaction occurring within the last 10 years: No If all of the above answers are "NO", then may proceed with Cephalosporin use.'  REACTION: Angioedema  . Adhesive [Tape] Rash  . Latex Rash  . Vancomycin Other (See Comments)    Pt can tolerate Vancomycin but did cause Red-Man Syndrome.  Recommend to pre-medicate with Benadryl before doses administered.      Facility-Administered Medications Prior to Admission  Medication Dose Route Frequency Provider Last Rate Last Dose  . [COMPLETED] ipratropium (ATROVENT) nebulizer solution 0.5 mg  0.5 mg Nebulization Once Gean Quint, MD   0.5 mg at 03/28/15 1725   Prescriptions Prior to Admission  Medication Sig Dispense Refill Last Dose  . acetaminophen (TYLENOL) 500 MG tablet Take 1,000 mg by mouth every 6 (six) hours as  needed. pain   Taking  . albuterol (PROAIR HFA) 108 (90 Base) MCG/ACT inhaler Inhale 2 puffs into the lungs every 4 (four) hours as needed for wheezing or shortness of breath (cough). 1 Inhaler 5 Taking  . budesonide (PULMICORT FLEXHALER) 180 MCG/ACT inhaler Inhale 2 puffs into the lungs 2 (two) times daily. 1 Inhaler 3 Taking  . budesonide (RHINOCORT AQUA) 32 MCG/ACT nasal spray Place 2 sprays into both nostrils daily. (Patient not taking: Reported on 08/06/2016) 8.6 g 3 Not Taking  . buPROPion (WELLBUTRIN XL) 300 MG 24 hr tablet Take 300 mg by mouth daily.  3 Taking  . busPIRone (BUSPAR) 10 MG tablet Take 15 mg by mouth 2 (two) times daily. For anxiety   Taking  . Cholecalciferol (VITAMIN D3) 5000 units CAPS Take by mouth. Takes 2 in the am   Taking  . diphenhydrAMINE (BENADRYL) 25 MG tablet Take 25 mg by mouth once as needed for allergies.   Taking  . indapamide (LOZOL) 2.5 MG tablet TK 1 T PO QD FOR FLUID  5 Not Taking  . loratadine (CLARITIN) 10 MG tablet TAKE 1 TABLET BY MOUTH EVERY DAY FOR RUNNY NOSE 30 tablet 5 Taking  . metFORMIN (GLUCOPHAGE) 500 MG tablet Take 500 mg by mouth daily with breakfast.   Taking  . montelukast (SINGULAIR) 10 MG tablet Take 1 tablet (10 mg total) by mouth at bedtime. 30 tablet 3 Taking  . pantoprazole (PROTONIX) 40 MG tablet TAKE 1 TABLET BY MOUTH DAILY 30 tablet 5 Taking  . Prenatal Vit-Fe Fumarate-FA (PRENATAL MULTIVITAMIN) TABS Take 1 tablet by mouth daily.    Taking  . promethazine (PHENERGAN) 25 MG tablet Take 1 tablet (25 mg total) by mouth every 6 (six) hours as needed for nausea or vomiting. 30 tablet 1 Taking  . vitamin E 400 UNIT capsule Take 400 Units by mouth daily.   Taking    Review of Systems  Gastrointestinal: Negative for abdominal pain.  Genitourinary: Negative for vaginal bleeding.  Skin: Positive for rash.   Physical Exam   Blood pressure (!) 147/69, pulse 99, temperature 98.3 F (36.8 C), temperature source Oral, resp. rate 18, weight  (!) 194 kg (427 lb 12 oz), last menstrual period 05/18/2016, SpO2 100 %.  Physical Exam  Constitutional: She is oriented to person, place, and time. She appears well-developed and well-nourished. No distress.  HENT:  Head: Normocephalic and atraumatic.  Neck: Normal range of motion.  Musculoskeletal: Normal range of motion.  Neurological:  She is alert and oriented to person, place, and time.  Skin: Skin is warm and dry. Rash noted. There is erythema.      Results for orders placed or performed during the hospital encounter of 09/18/16 (from the past 24 hour(s))  CBC with Differential/Platelet     Status: Abnormal   Collection Time: 09/18/16  5:31 PM  Result Value Ref Range   WBC 13.5 (H) 4.0 - 10.5 K/uL   RBC 4.02 3.87 - 5.11 MIL/uL   Hemoglobin 10.7 (L) 12.0 - 15.0 g/dL   HCT 34.3 (L) 36.0 - 46.0 %   MCV 85.3 78.0 - 100.0 fL   MCH 26.6 26.0 - 34.0 pg   MCHC 31.2 30.0 - 36.0 g/dL   RDW 16.5 (H) 11.5 - 15.5 %   Platelets 319 150 - 400 K/uL   Neutrophils Relative % 77 %   Neutro Abs 10.4 (H) 1.7 - 7.7 K/uL   Lymphocytes Relative 15 %   Lymphs Abs 2.0 0.7 - 4.0 K/uL   Monocytes Relative 3 %   Monocytes Absolute 0.4 0.1 - 1.0 K/uL   Eosinophils Relative 5 %   Eosinophils Absolute 0.6 0.0 - 0.7 K/uL   Basophils Relative 0 %   Basophils Absolute 0.0 0.0 - 0.1 K/uL   MAU Course  Procedures  MDM Labs ordered and reviewed. Consult with Dr. Elonda Husky. Recommend Cleocin and Silvadene. Will also treat with Elimite. Stable for discharge home.  Assessment and Plan   1. [redacted] weeks gestation of pregnancy   2. Supervision of high risk pregnancy, antepartum   3. Cellulitis of right lower leg   4. Bedbug bite, initial encounter    Discharge home Follow up at [facility] in 5 days Rx Elimite- one time treatment Rx Cleocin Rx Silvadene- apply to red area and loosely wrap tid OOW until 09/23/16  Julianne Handler, CNM 09/18/2016, 4:59 PM

## 2016-09-18 NOTE — Discharge Instructions (Signed)
Cellulitis, Adult Cellulitis is a skin infection. The infected area is usually red and tender. This condition occurs most often in the arms and lower legs. The infection can travel to the muscles, blood, and underlying tissue and become serious. It is very important to get treated for this condition. What are the causes? Cellulitis is caused by bacteria. The bacteria enter through a break in the skin, such as a cut, burn, insect bite, open sore, or crack. What increases the risk? This condition is more likely to occur in people who:  Have a weak defense system (immune system).  Have open wounds on the skin such as cuts, burns, bites, and scrapes. Bacteria can enter the body through these open wounds.  Are older.  Have diabetes.  Have a type of long-lasting (chronic) liver disease (cirrhosis) or kidney disease.  Use IV drugs.  What are the signs or symptoms? Symptoms of this condition include:  Redness, streaking, or spotting on the skin.  Swollen area of the skin.  Tenderness or pain when an area of the skin is touched.  Warm skin.  Fever.  Chills.  Blisters.  How is this diagnosed? This condition is diagnosed based on a medical history and physical exam. You may also have tests, including:  Blood tests.  Lab tests.  Imaging tests.  How is this treated? Treatment for this condition may include:  Medicines, such as antibiotic medicines or antihistamines.  Supportive care, such as rest and application of cold or warm cloths (cold or warm compresses) to the skin.  Hospital care, if the condition is severe.  The infection usually gets better within 1-2 days of treatment. Follow these instructions at home:  Take over-the-counter and prescription medicines only as told by your health care provider.  If you were prescribed an antibiotic medicine, take it as told by your health care provider. Do not stop taking the antibiotic even if you start to feel  better.  Drink enough fluid to keep your urine clear or pale yellow.  Do not touch or rub the infected area.  Raise (elevate) the infected area above the level of your heart while you are sitting or lying down.  Apply warm or cold compresses to the affected area as told by your health care provider.  Keep all follow-up visits as told by your health care provider. This is important. These visits let your health care provider make sure a more serious infection is not developing. Contact a health care provider if:  You have a fever.  Your symptoms do not improve within 1-2 days of starting treatment.  Your bone or joint underneath the infected area becomes painful after the skin has healed.  Your infection returns in the same area or another area.  You notice a swollen bump in the infected area.  You develop new symptoms.  You have a general ill feeling (malaise) with muscle aches and pains. Get help right away if:  Your symptoms get worse.  You feel very sleepy.  You develop vomiting or diarrhea that persists.  You notice red streaks coming from the infected area.  Your red area gets larger or turns dark in color. This information is not intended to replace advice given to you by your health care provider. Make sure you discuss any questions you have with your health care provider. Document Released: 01/02/2005 Document Revised: 08/03/2015 Document Reviewed: 02/01/2015 Elsevier Interactive Patient Education  2017 [place] are tiny bugs that live in and around  beds. They stay hidden during the day, and they come out at night and bite. Bedbugs need blood to live and grow. Where are bedbugs found? Bedbugs can be found anywhere, whether a place is clean or dirty. They are most often found in places where many people come and go, such as hotels, shelters, dorms, and health care settings. It is also common for them to be found in homes where there are many  birds or bats nearby. What are bedbug bites like? A bedbug bite leaves a small red bump with a darker red dot in the middle. The bump may appear soon after a person is bitten or a day or more later. Bedbug bites usually do not hurt, but they may itch. Most people do not need treatment for bedbug bites. The bumps usually go away on their own in a few days. How do I check for bedbugs? Bedbugs are reddish-brown, oval, and flat. They range in size from 1 mm to 7 mm and they cannot fly. Look for bedbugs in these places:  On mattresses, bed frames, headboards, and box springs.  On drapes and curtains in bedrooms.  Under carpeting in bedrooms.  Behind electrical outlets.  Behind any wallpaper that is peeling.  Inside luggage.  Also look for black or red spots or stains on or near the bed. Stains can come from bedbugs that have been crushed or from bedbug waste. What should I do if I find bedbugs? When Traveling If you find bedbugs while traveling, check all of your possessions carefully before you bring them into your home. Consider throwing away anything that has bedbugs on it. At Home If you find bedbugs at home, your bedroom may need to be treated by a pest control expert. You may also need to throw away mattresses or luggage. To help keep bedbugs from coming back, consider taking these actions:  Put a plastic cover over your mattress.  Wash your clothes and bedding in water that is hotter than 120F (48.9C) and dry them on a hot setting. Bedbugs are killed by high temperatures.  Vacuum often around the bed and in all of the cracks and crevices where the bugs might hide.  Carefully check all used furniture, bedding, or clothes that you bring into your home.  Eliminate bird nests and bat roosts that are near your home.  In Your Bed If you find bedbugs in your bed, consider wearing pajamas that have long sleeves and pant legs. Bedbugs usually bite areas of the skin that are not  covered. This information is not intended to replace advice given to you by your health care provider. Make sure you discuss any questions you have with your health care provider. Document Released: 04/27/2010 Document Revised: 08/31/2015 Document Reviewed: 03/21/2014 Elsevier Interactive Patient Education  [employer].

## 2016-09-18 NOTE — MAU Note (Signed)
Rash on arms, spreading to trunk.  Has been there for a couple wks.  Showed her OB, they said use wet compresses and cortisone cream.  Is not helping.  Getting worse, itchs and burns.

## 2016-09-19 ENCOUNTER — Telehealth: Payer: Self-pay | Admitting: Obstetrics & Gynecology

## 2016-09-19 NOTE — Telephone Encounter (Signed)
Patient states she already has appointment on Wednesday. Will just keep that appointment and follow-up.

## 2016-09-24 ENCOUNTER — Other Ambulatory Visit: Payer: Self-pay | Admitting: Women's Health

## 2016-09-24 DIAGNOSIS — F332 Major depressive disorder, recurrent severe without psychotic features: Secondary | ICD-10-CM | POA: Diagnosis not present

## 2016-09-24 DIAGNOSIS — O30049 Twin pregnancy, dichorionic/diamniotic, unspecified trimester: Secondary | ICD-10-CM

## 2016-09-24 DIAGNOSIS — O24919 Unspecified diabetes mellitus in pregnancy, unspecified trimester: Secondary | ICD-10-CM

## 2016-09-24 DIAGNOSIS — Z363 Encounter for antenatal screening for malformations: Secondary | ICD-10-CM

## 2016-09-25 ENCOUNTER — Ambulatory Visit (INDEPENDENT_AMBULATORY_CARE_PROVIDER_SITE_OTHER): Payer: Medicare HMO

## 2016-09-25 ENCOUNTER — Encounter: Payer: Self-pay | Admitting: Obstetrics and Gynecology

## 2016-09-25 ENCOUNTER — Ambulatory Visit (INDEPENDENT_AMBULATORY_CARE_PROVIDER_SITE_OTHER): Payer: Medicare HMO | Admitting: Obstetrics and Gynecology

## 2016-09-25 VITALS — BP 120/80 | HR 80 | Wt >= 6400 oz

## 2016-09-25 DIAGNOSIS — O24912 Unspecified diabetes mellitus in pregnancy, second trimester: Secondary | ICD-10-CM

## 2016-09-25 DIAGNOSIS — O30042 Twin pregnancy, dichorionic/diamniotic, second trimester: Secondary | ICD-10-CM

## 2016-09-25 DIAGNOSIS — Z363 Encounter for antenatal screening for malformations: Secondary | ICD-10-CM | POA: Diagnosis not present

## 2016-09-25 DIAGNOSIS — O99211 Obesity complicating pregnancy, first trimester: Secondary | ICD-10-CM

## 2016-09-25 DIAGNOSIS — O30049 Twin pregnancy, dichorionic/diamniotic, unspecified trimester: Secondary | ICD-10-CM

## 2016-09-25 DIAGNOSIS — O0990 Supervision of high risk pregnancy, unspecified, unspecified trimester: Secondary | ICD-10-CM

## 2016-09-25 DIAGNOSIS — O24919 Unspecified diabetes mellitus in pregnancy, unspecified trimester: Secondary | ICD-10-CM

## 2016-09-25 DIAGNOSIS — Z1389 Encounter for screening for other disorder: Secondary | ICD-10-CM

## 2016-09-25 DIAGNOSIS — Z6841 Body Mass Index (BMI) 40.0 and over, adult: Secondary | ICD-10-CM

## 2016-09-25 DIAGNOSIS — Z331 Pregnant state, incidental: Secondary | ICD-10-CM

## 2016-09-25 DIAGNOSIS — O0992 Supervision of high risk pregnancy, unspecified, second trimester: Secondary | ICD-10-CM

## 2016-09-25 LAB — POCT URINALYSIS DIPSTICK
Blood, UA: NEGATIVE
Glucose, UA: NEGATIVE
KETONES UA: NEGATIVE
LEUKOCYTES UA: NEGATIVE
Nitrite, UA: NEGATIVE

## 2016-09-25 NOTE — Progress Notes (Signed)
Patient ID: Heather [name], female   DOB: 1975/12/28, 41 y.o.   MRN: [id number]   High Risk Pregnancy HROB Diagnosis(es):   Di-Di Twins, Type2/ClassBDM, AMA, morbid obesity  G2P0010 [redacted]w[redacted]d Estimated Date of Delivery: 02/22/17     HPI: The patient is being seen today for ongoing management of the above. ____  Today she reports she was hospitalized for tx of cellulitis last week. She states she tries to combat edema with lower extremity massage.   Patient reports good fetal movement. She denies any bleeding and no rupture of membranes symptoms or regular contractions.   BP weight and urine results reviewed and noted. Blood pressure 120/80, pulse 80, weight (!) 420 lb (190.5 kg), last menstrual period 05/18/2016.  Fetal Heart rate:  146 A/ 148 B Physical Examination: Abdomen - soft, nontender, nondistended, no masses or organomegaly                   Marked lower extremity edema/ Urinalysis: POSITIVE for trace protein   Fetal Surveillance Testing today:  Fetal US  Lab and sonogram results have been reviewed.  Assessment:  1.  Pregnancy at [redacted]w[redacted]d,  G2P0010   :  Estimated Date of Delivery: 02/22/17                         2.  DiDi twins                        3. Type 2/ClassBDM- fastings <90, no PP>100    4. AMA   5. Morbid obesity - strongly encouraged water exercises   Medication(s) Plans:  81 mg ASA, 500 mg Metformin   Treatment Plan:   1. Growth u/s @ 20, 24, 28, 32, 35, 38wks       2. Fetal echo 24-28wks   3. 2x/wk testing @ 32wks       4. Deliver @ 38wks  The provider spent over 15 minutes with the visit, including pre visit review, documentation, with >than 50% spent in counseling and coordination of care.   Follow up in 2 weeks for appointment for high risk OB care   By signing my name below, I, Hansel Feinstein, attest that this documentation has been prepared under the direction and in the presence of Jonnie Kind, MD. Electronically Signed: Hansel Feinstein, ED Scribe.  09/25/16. 11:26 AM.  I personally performed the services described in this documentation, which was SCRIBED in my presence. The recorded information has been reviewed and considered accurate. It has been edited as necessary during review. Jonnie Kind, MD

## 2016-09-25 NOTE — Progress Notes (Signed)
[place] 18+4 wks,DI/DI twins: cx 4.1 cm,ant pl gr 0,bilat adnexa's wnl,ovaries not visualized BABY A: inferior breech,fhr 146,limited view of heart,svp of fluid 4.1 cm,no obvious abnormalities seen,efw 245 g BABY B: superior trans head right,limited view of face,svp of fluid 4.2 cm,no obvious abnormalities seen,efw 231 g,discordance 5.9%

## 2016-09-26 IMAGING — DX DG LUMBAR SPINE COMPLETE 4+V
5 series · 5 of 5 positions shown · non-contrast
Comparison: CT lumbar spine 11/03/2010

CLINICAL DATA: Patient fell forward from [facility] Morelli on chest and
abdomen. Complains of low back pain, right knee pain, right foot
pain. History of morbid obesity. Positioning is limited due to
patient's body habitus.

EXAM:
LUMBAR SPINE - COMPLETE 4+ VIEW

[l-spine ap]
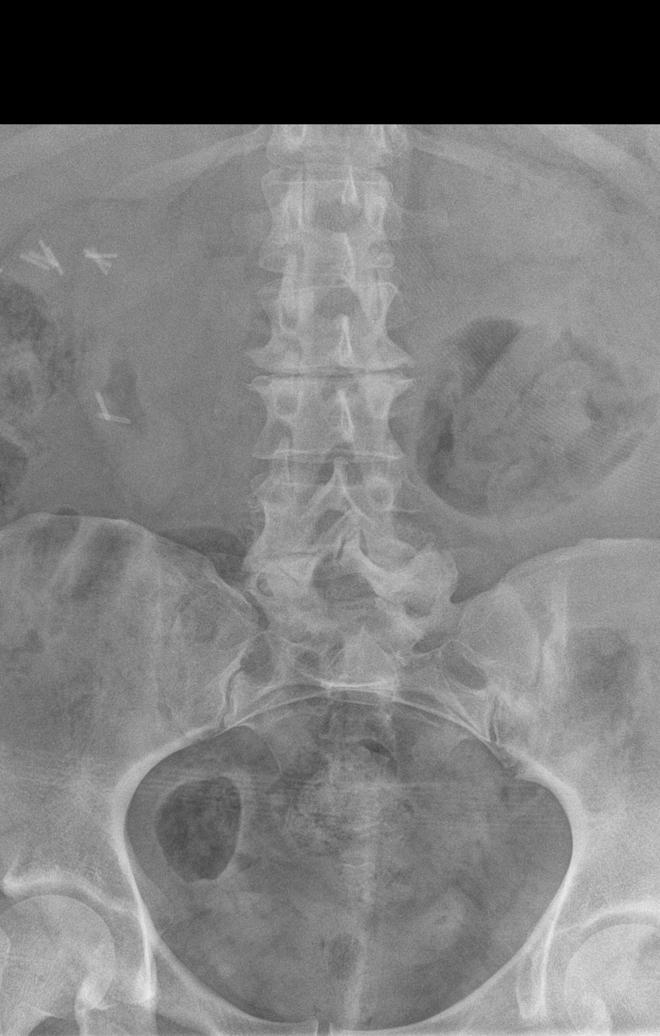

[l-spine obl (1 of 2)]
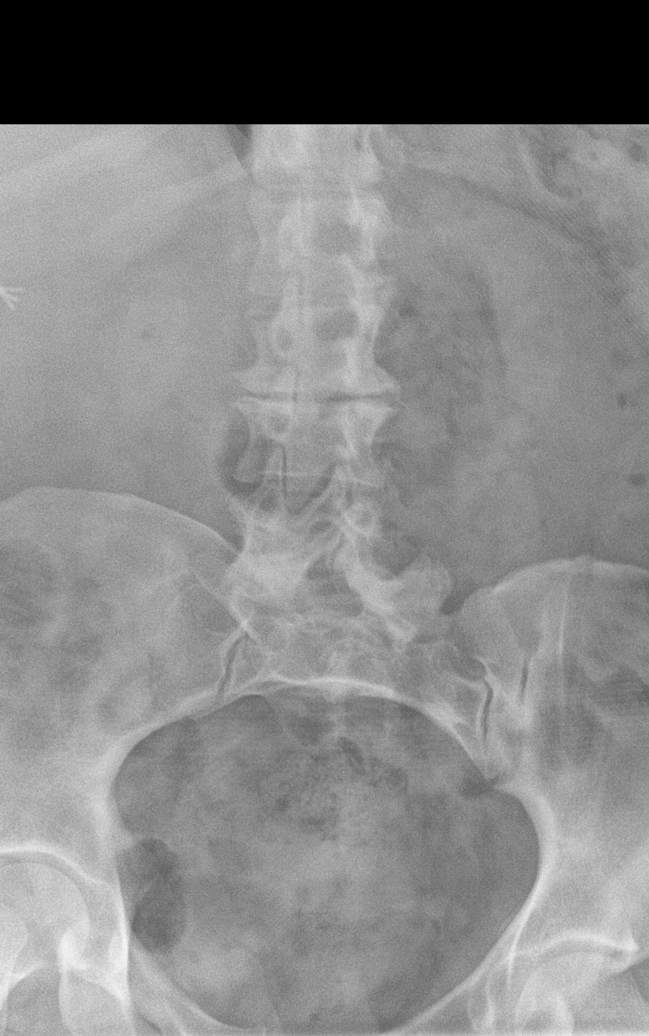

[l-spine lat]
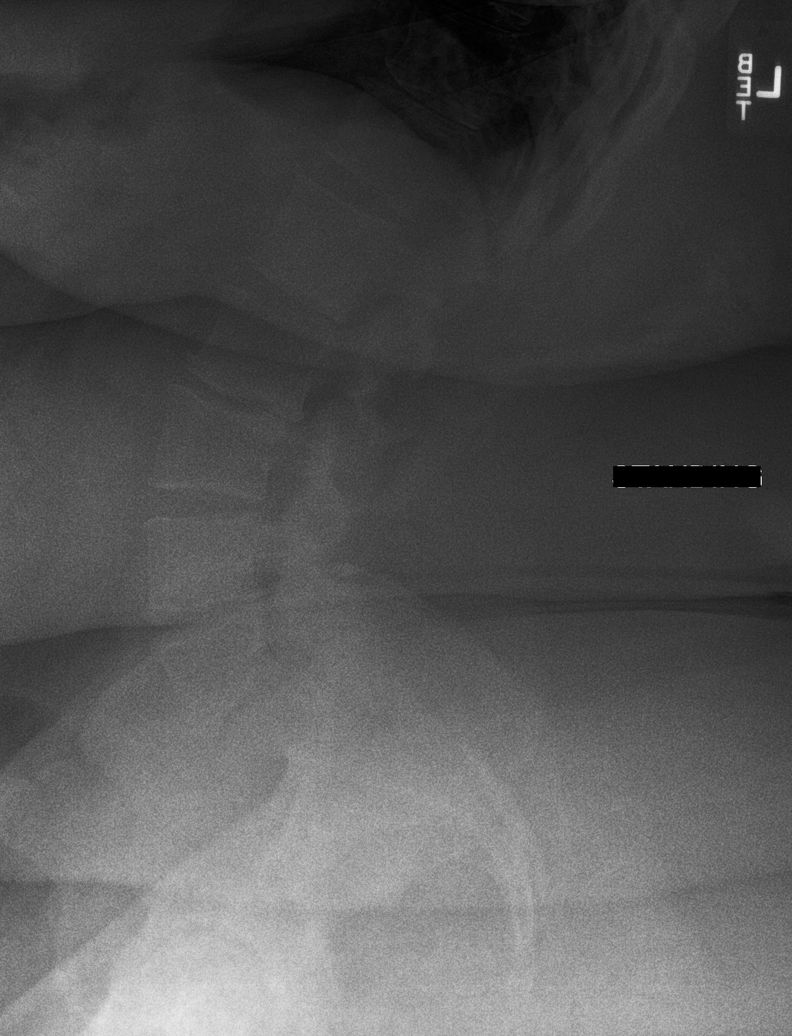

[l-spine spot]
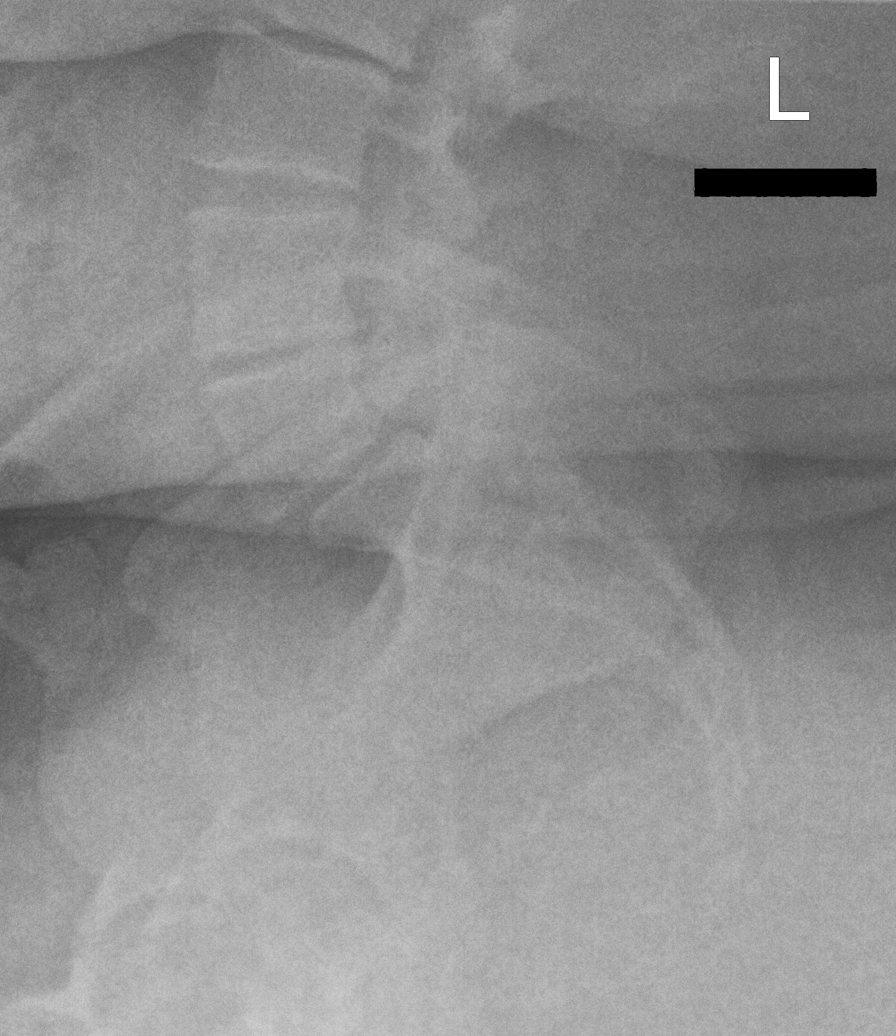

[l-spine obl (2 of 2)]
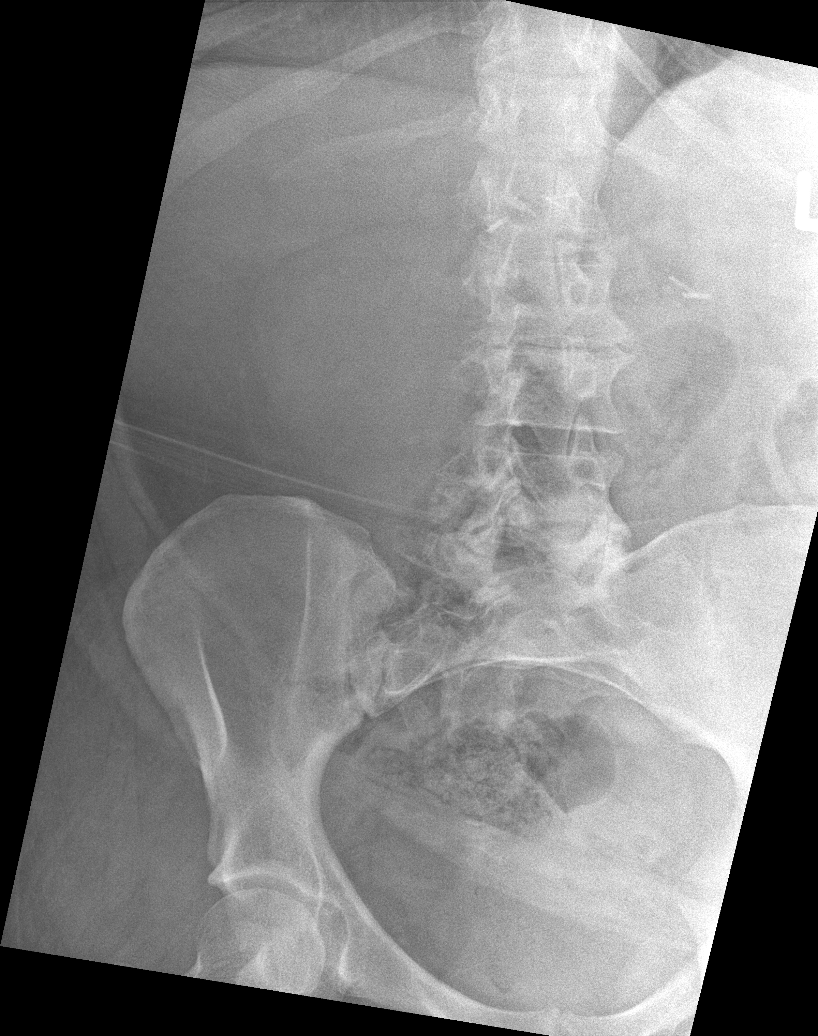

[5 of 5 positions shown; findings below may reference images not displayed]

FINDINGS: Examination is limited due to soft tissue attenuation. There is no
evidence of lumbar spine fracture. Alignment is normal. Degenerative
changes with narrowed disc spaces and endplate hypertrophic changes.
IMPRESSION: Technically limited study. Mild degenerative changes. Normal
alignment. No displaced fractures identified.

## 2016-10-10 ENCOUNTER — Ambulatory Visit (INDEPENDENT_AMBULATORY_CARE_PROVIDER_SITE_OTHER): Payer: Medicare HMO | Admitting: Obstetrics and Gynecology

## 2016-10-10 ENCOUNTER — Encounter: Payer: Self-pay | Admitting: Obstetrics and Gynecology

## 2016-10-10 VITALS — BP 120/62 | HR 98 | Wt >= 6400 oz

## 2016-10-10 DIAGNOSIS — O99712 Diseases of the skin and subcutaneous tissue complicating pregnancy, second trimester: Secondary | ICD-10-CM

## 2016-10-10 DIAGNOSIS — Z3A20 20 weeks gestation of pregnancy: Secondary | ICD-10-CM

## 2016-10-10 DIAGNOSIS — O30042 Twin pregnancy, dichorionic/diamniotic, second trimester: Secondary | ICD-10-CM

## 2016-10-10 DIAGNOSIS — O0992 Supervision of high risk pregnancy, unspecified, second trimester: Secondary | ICD-10-CM

## 2016-10-10 DIAGNOSIS — O0990 Supervision of high risk pregnancy, unspecified, unspecified trimester: Secondary | ICD-10-CM

## 2016-10-10 DIAGNOSIS — L299 Pruritus, unspecified: Secondary | ICD-10-CM

## 2016-10-10 DIAGNOSIS — O24912 Unspecified diabetes mellitus in pregnancy, second trimester: Secondary | ICD-10-CM

## 2016-10-10 DIAGNOSIS — Z6841 Body Mass Index (BMI) 40.0 and over, adult: Secondary | ICD-10-CM

## 2016-10-10 DIAGNOSIS — Z1389 Encounter for screening for other disorder: Secondary | ICD-10-CM

## 2016-10-10 DIAGNOSIS — Z331 Pregnant state, incidental: Secondary | ICD-10-CM

## 2016-10-10 LAB — POCT URINALYSIS DIPSTICK
GLUCOSE UA: NEGATIVE
KETONES UA: NEGATIVE
Nitrite, UA: NEGATIVE

## 2016-10-10 NOTE — Addendum Note (Signed)
Addended by: Levy Pupa S on: 10/10/2016 11:16 AM   Modules accepted: Orders

## 2016-10-10 NOTE — Progress Notes (Addendum)
High Risk Pregnancy HROB Diagnosis(es):   Di-Di Twins, Type2/ClassBDM, AMA, morbid obesity  G2P0010 [redacted]w[redacted]d Estimated Date of Delivery: 02/22/17    HPI: The patient is being seen today for ongoing management of above. Today she reports lower leg swelling and a pruritic rash to the abdomen.  Patient reports good fetal movement, denies any bleeding and no rupture of membranes symptoms or regular contractions.   BP weight and urine results reviewed and noted. Blood pressure 120/62, pulse 98, weight (!) 426 lb (193.2 kg), last menstrual period 05/18/2016.  Fundal Height: U+16 Fetal Heart rate:  146 A / 156 B Physical Examination:                                      Pelvic - examination not indicated                                     Edema:  Present, chronic lymphedema  Calf measurements at 20 cm above each ankle - Right calf: 60.5 cm, Left calf: 59 cm  Urinalysis: 3+ blood, trace protein, 2+ leukocytes   Fetal Surveillance Testing today:  None today   Lab and sonogram results have been reviewed. Comments:    Assessment:  1.  Pregnancy at [redacted]w[redacted]d,  G2P0010   :  Estimated Date of Delivery: 02/22/17                         2.  DiDi twins                        3. Type 2/class BDM - forgot to bring log to appt   4. AMA   5. Morbid obesity - strongly encouraged water exercises      Medication(s) Plans:  81 mg ASA, 500mg  Metformin   Treatment Plan:   1. Growth u/s @ 20, 24, 28, 32, 35, 38wks  2. Fetal echo 24-28wks  3. 2x/wk testing @ 32wks  4. Deliver @ 38wks 5. Check bile acids  6 urine for C&S Follow up in 2 weeks for appointment for high risk OB care,  U/S at second visit 24+wk By signing my name below, I, Sonum Patel, attest that this documentation has been prepared under the direction and in the presence of Jonnie Kind, MD. Electronically Signed: Sonum Patel, Education administrator. 10/10/16. 11:26 AM.  I personally performed the services described in this documentation,  which was SCRIBED in my presence. The recorded information has been reviewed and considered accurate. It has been edited as necessary during review. Jonnie Kind, MD

## 2016-10-11 DIAGNOSIS — L299 Pruritus, unspecified: Secondary | ICD-10-CM | POA: Diagnosis not present

## 2016-10-13 LAB — BILE ACIDS, TOTAL: Bile Acids Total: 9.3 umol/L (ref 4.7–24.5)

## 2016-10-22 DIAGNOSIS — F332 Major depressive disorder, recurrent severe without psychotic features: Secondary | ICD-10-CM | POA: Diagnosis not present

## 2016-10-24 ENCOUNTER — Encounter: Payer: Self-pay | Admitting: Women's Health

## 2016-10-24 ENCOUNTER — Ambulatory Visit (INDEPENDENT_AMBULATORY_CARE_PROVIDER_SITE_OTHER): Payer: Medicare HMO | Admitting: Women's Health

## 2016-10-24 VITALS — BP 120/60 | HR 88 | Wt >= 6400 oz

## 2016-10-24 DIAGNOSIS — O24112 Pre-existing diabetes mellitus, type 2, in pregnancy, second trimester: Secondary | ICD-10-CM

## 2016-10-24 DIAGNOSIS — Z1389 Encounter for screening for other disorder: Secondary | ICD-10-CM

## 2016-10-24 DIAGNOSIS — O0990 Supervision of high risk pregnancy, unspecified, unspecified trimester: Secondary | ICD-10-CM

## 2016-10-24 DIAGNOSIS — O09892 Supervision of other high risk pregnancies, second trimester: Secondary | ICD-10-CM

## 2016-10-24 DIAGNOSIS — O30049 Twin pregnancy, dichorionic/diamniotic, unspecified trimester: Secondary | ICD-10-CM

## 2016-10-24 DIAGNOSIS — O24119 Pre-existing diabetes mellitus, type 2, in pregnancy, unspecified trimester: Secondary | ICD-10-CM

## 2016-10-24 DIAGNOSIS — L299 Pruritus, unspecified: Secondary | ICD-10-CM

## 2016-10-24 DIAGNOSIS — O30042 Twin pregnancy, dichorionic/diamniotic, second trimester: Secondary | ICD-10-CM

## 2016-10-24 DIAGNOSIS — O24919 Unspecified diabetes mellitus in pregnancy, unspecified trimester: Secondary | ICD-10-CM

## 2016-10-24 DIAGNOSIS — Z331 Pregnant state, incidental: Secondary | ICD-10-CM

## 2016-10-24 LAB — POCT URINALYSIS DIPSTICK
Glucose, UA: NEGATIVE
Ketones, UA: NEGATIVE
NITRITE UA: NEGATIVE

## 2016-10-24 NOTE — Patient Instructions (Addendum)
Call the office [phone]) or go to [facility] if:  You begin to have strong, frequent contractions  Your water breaks.  Sometimes it is a big gush of fluid, sometimes it is just a trickle that keeps getting your panties wet or running down your legs  You have vaginal bleeding.  It is normal to have a small amount of spotting if your cervix was checked.   You don't feel your baby moving like normal.  If you don't, get you something to eat and drink and lay down and focus on feeling your baby move.  You should feel at least 10 movements in 2 hours.  If you don't, you should call the office or go to [facility] The second trimester is from week 14 through week 27 (months 4 through 6). The second trimester is often a time when you feel your best. Your body has adjusted to being pregnant, and you begin to feel better physically. Usually, morning sickness has lessened or quit completely, you may have more energy, and you may have an increase in appetite. The second trimester is also a time when the fetus is growing rapidly. At the end of the sixth month, the fetus is about 9 inches long and weighs about 1 pounds. You will likely begin to feel the baby move (quickening) between 16 and 20 weeks of pregnancy. Body changes during your second trimester Your body continues to go through many changes during your second trimester. The changes vary from woman to woman.  Your weight will continue to increase. You will notice your lower abdomen bulging out.  You may begin to get stretch marks on your hips, abdomen, and breasts.  You may develop headaches that can be relieved by medicines. The medicines should be approved by your health care provider.  You may urinate more often because the fetus is pressing on your bladder.  You may develop or continue to have heartburn as a result of your pregnancy.  You may develop constipation because certain hormones are  causing the muscles that push waste through your intestines to slow down.  You may develop hemorrhoids or swollen, bulging veins (varicose veins).  You may have back pain. This is caused by: ? Weight gain. ? Pregnancy hormones that are relaxing the joints in your pelvis. ? A shift in weight and the muscles that support your balance.  Your breasts will continue to grow and they will continue to become tender.  Your gums may bleed and may be sensitive to brushing and flossing.  Dark spots or blotches (chloasma, mask of pregnancy) may develop on your face. This will likely fade after the baby is born.  A dark line from your belly button to the pubic area (linea nigra) may appear. This will likely fade after the baby is born.  You may have changes in your hair. These can include thickening of your hair, rapid growth, and changes in texture. Some women also have hair loss during or after pregnancy, or hair that feels dry or thin. Your hair will most likely return to normal after your baby is born.  What to expect at prenatal visits During a routine prenatal visit:  You will be weighed to make sure you and the fetus are growing normally.  Your blood pressure will be taken.  Your abdomen will be measured to track your baby's growth.  The fetal heartbeat will be listened to.  Any test results from  the previous visit will be discussed.  Your health care provider may ask you:  How you are feeling.  If you are feeling the baby move.  If you have had any abnormal symptoms, such as leaking fluid, bleeding, severe headaches, or abdominal cramping.  If you are using any tobacco products, including cigarettes, chewing tobacco, and electronic cigarettes.  If you have any questions.  Other tests that may be performed during your second trimester include:  Blood tests that check for: ? Low iron levels (anemia). ? High blood sugar that affects pregnant women (gestational diabetes) between  52 and 28 weeks. ? Rh antibodies. This is to check for a protein on red blood cells (Rh factor).  Urine tests to check for infections, diabetes, or protein in the urine.  An ultrasound to confirm the proper growth and development of the baby.  An amniocentesis to check for possible genetic problems.  Fetal screens for spina bifida and Down syndrome.  HIV (human immunodeficiency virus) testing. Routine prenatal testing includes screening for HIV, unless you choose not to have this test.  Follow these instructions at home: Medicines  Follow your health care provider's instructions regarding medicine use. Specific medicines may be either safe or unsafe to take during pregnancy.  Take a prenatal vitamin that contains at least 600 micrograms (mcg) of folic acid.  If you develop constipation, try taking a stool softener if your health care provider approves. Eating and drinking  Eat a balanced diet that includes fresh fruits and vegetables, whole grains, good sources of protein such as meat, eggs, or tofu, and low-fat dairy. Your health care provider will help you determine the amount of weight gain that is right for you.  Avoid raw meat and uncooked cheese. These carry germs that can cause birth defects in the baby.  If you have low calcium intake from food, talk to your health care provider about whether you should take a daily calcium supplement.  Limit foods that are high in fat and processed sugars, such as fried and sweet foods.  To prevent constipation: ? Drink enough fluid to keep your urine clear or pale yellow. ? Eat foods that are high in fiber, such as fresh fruits and vegetables, whole grains, and beans. Activity  Exercise only as directed by your health care provider. Most women can continue their usual exercise routine during pregnancy. Try to exercise for 30 minutes at least 5 days a week. Stop exercising if you experience uterine contractions.  Avoid heavy lifting,  wear low heel shoes, and practice good posture.  A sexual relationship may be continued unless your health care provider directs you otherwise. Relieving pain and discomfort  Wear a good support bra to prevent discomfort from breast tenderness.  Take warm sitz baths to soothe any pain or discomfort caused by hemorrhoids. Use hemorrhoid cream if your health care provider approves.  Rest with your legs elevated if you have leg cramps or low back pain.  If you develop varicose veins, wear support hose. Elevate your feet for 15 minutes, 3-4 times a day. Limit salt in your diet. Prenatal Care  Write down your questions. Take them to your prenatal visits.  Keep all your prenatal visits as told by your health care provider. This is important. Safety  Wear your seat belt at all times when driving.  Make a list of emergency phone numbers, including numbers for family, friends, the hospital, and police and fire departments. General instructions  Ask your health care provider  for a referral to a local prenatal education class. Begin classes no later than the beginning of month 6 of your pregnancy.  Ask for help if you have counseling or nutritional needs during pregnancy. Your health care provider can offer advice or refer you to specialists for help with various needs.  Do not use hot tubs, steam rooms, or saunas.  Do not douche or use tampons or scented sanitary pads.  Do not cross your legs for long periods of time.  Avoid cat litter boxes and soil used by cats. These carry germs that can cause birth defects in the baby and possibly loss of the fetus by miscarriage or stillbirth.  Avoid all smoking, herbs, alcohol, and unprescribed drugs. Chemicals in these products can affect the formation and growth of the baby.  Do not use any products that contain nicotine or tobacco, such as cigarettes and e-cigarettes. If you need help quitting, ask your health care provider.  Visit your dentist  if you have not gone yet during your pregnancy. Use a soft toothbrush to brush your teeth and be gentle when you floss. Contact a health care provider if:  You have dizziness.  You have mild pelvic cramps, pelvic pressure, or nagging pain in the abdominal area.  You have persistent nausea, vomiting, or diarrhea.  You have a bad smelling vaginal discharge.  You have pain when you urinate. Get help right away if:  You have a fever.  You are leaking fluid from your vagina.  You have spotting or bleeding from your vagina.  You have severe abdominal cramping or pain.  You have rapid weight gain or weight loss.  You have shortness of breath with chest pain.  You notice sudden or extreme swelling of your face, hands, ankles, feet, or legs.  You have not felt your baby move in over an hour.  You have severe headaches that do not go away when you take medicine.  You have vision changes. Summary  The second trimester is from week 14 through week 27 (months 4 through 6). It is also a time when the fetus is growing rapidly.  Your body goes through many changes during pregnancy. The changes vary from woman to woman.  Avoid all smoking, herbs, alcohol, and unprescribed drugs. These chemicals affect the formation and growth your baby.  Do not use any tobacco products, such as cigarettes, chewing tobacco, and e-cigarettes. If you need help quitting, ask your health care provider.  Contact your health care provider if you have any questions. Keep all prenatal visits as told by your health care provider. This is important. This information is not intended to replace advice given to you by your health care provider. Make sure you discuss any questions you have with your health care provider. Document Released: 03/19/2001 Document Revised: 08/31/2015 Document Reviewed: 05/26/2012 Elsevier Interactive Patient Education  2017 [place] [facility]  [facility] [place] [address] [place], [place] [address] Phone - [phone]   Fax - [place] [address] [place], [place]  [address] Phone - [phone]   Fax - [phone]  [place] [place] [address] [place], [place]  [address] Phone - [phone]   Fax - [phone]  [facility] [address] [place], [place]  [address] Phone - [phone]   Fax - [phone]  [place] [address], [place],   [address] Phone -  [phone]   Fax - [place], [address] [place], [place]  [address] Phone - [phone]   Fax - [place] [address], [place], [place]  [address] Phone - [phone]   Fax - [phone]  [place] [address] [place], [place], [place]  [address] Phone - [phone]   Fax - [place] [address] [place], [place]  [address] Phone - [phone]   Fax - [phone] [facility] [place] [place], [place]  [address] Phone - [phone]   Fax - [phone]  [place] [address] [place], [place]  [address] Phone - [phone]   Fax - [phone]  [facility] [place], [place], [place]  [address] Phone - [phone]   Fax - [place] [address] [place], [place]  [address] Phone - [phone]   Fax - [phone]  [facility] [address] [place], [place]  [address] Phone - [place], [place]  [address] Phone - [phone]   Fax - [place] [address] [place], [place]  [address] Phone -  [phone]   Fax - [phone]  [place] [address] [place], [place]  [address] Phone - [phone]   Fax - [place]. [place], [place]  [address] Phone - [phone]   Fax - [place], [place], [place]  [address] Phone - [phone]   Fax - [place] [address] [place], [place]  [address] Phone - [phone]   Fax - [phone]  [place] RUBIN [address] [place], [place]  [address] Phone - [phone]   Fax - [place] [address], [place], [place]  [address] Phone - [phone]   Fax - [phone]  [place] [address] [place], [place]  [address] Phone - [phone]   Fax - [phone] [place] [address] [place], [place]  [address] Phone - [phone]   Fax - [place] [address] [place], [place]  [address] Phone - [phone]   Fax - [place] [address] [place],   [address] Phone - [phone]   Fax - [phone]

## 2016-10-24 NOTE — Progress Notes (Signed)
High Risk Pregnancy Diagnosis(es): Di-Di Twins, Type2DM, AMA, morbid obesity G2P0010 [redacted]w[redacted]d Estimated Date of Delivery: 02/22/17 BP 120/60   Pulse 88   Wt (!) 189.2 kg (417 lb 3.2 oz)   LMP 05/18/2016 (Exact Date)   BMI 73.90 kg/m   Urinalysis: Positive for blood, leuks, protein HPI:  Itching all over, worse at night, some on palms/soles, bile acids 9.3 (7/6) All FBS 90 or less except one 95, all 2hr pp <120 BP, weight, and urine reviewed.  Reports good fm. Denies regular uc's, lof, vb, uti s/s.   Fundal Height:  Not measured Fetal Heart rate:  138/145 Edema:  none  Reviewed ptl s/s, fm Cools showers/wash cloths, benadryl, hydrocortisone or benadryl cream for itching All questions were answered Assessment: [redacted]w[redacted]d Di-Di Twins, Type2DM, AMA, morbid obesity Medication(s) Plans:  Baby asa, metformin 500mg  daily Treatment Plan:  Growth u/s @24 , 28, 32, 35, 38wks      Fetal echo 24-28wks-referral faxed today    2x/wk testing @ 32wks      Deliver @ 38wks Follow up in 1.5wks for high-risk OB appt and twin efw/afi u/s Return tomorrow am for fasting bile acids/cmp

## 2016-10-25 ENCOUNTER — Other Ambulatory Visit: Payer: Medicare HMO

## 2016-10-25 DIAGNOSIS — Z331 Pregnant state, incidental: Secondary | ICD-10-CM | POA: Diagnosis not present

## 2016-10-25 DIAGNOSIS — L299 Pruritus, unspecified: Secondary | ICD-10-CM | POA: Diagnosis not present

## 2016-10-26 LAB — COMPREHENSIVE METABOLIC PANEL WITH GFR
ALT: 26 IU/L (ref 0–32)
AST: 20 IU/L (ref 0–40)
Albumin/Globulin Ratio: 1.3 (ref 1.2–2.2)
Albumin: 3.6 g/dL (ref 3.5–5.5)
Alkaline Phosphatase: 58 IU/L (ref 39–117)
BUN/Creatinine Ratio: 15 (ref 9–23)
BUN: 6 mg/dL (ref 6–20)
Bilirubin Total: 0.2 mg/dL (ref 0.0–1.2)
CO2: 20 mmol/L (ref 20–29)
Calcium: 9.3 mg/dL (ref 8.7–10.2)
Chloride: 104 mmol/L (ref 96–106)
Creatinine, Ser: 0.4 mg/dL — ABNORMAL LOW (ref 0.57–1.00)
GFR calc Af Amer: 153 mL/min/1.73 (ref >59)
GFR calc non Af Amer: 133 mL/min/1.73 (ref >59)
Globulin, Total: 2.8 g/dL (ref 1.5–4.5)
Glucose: 81 mg/dL (ref 65–99)
Potassium: 4.4 mmol/L (ref 3.5–5.2)
Sodium: 140 mmol/L (ref 134–144)
Total Protein: 6.4 g/dL (ref 6.0–8.5)

## 2016-10-27 LAB — BILE ACIDS, TOTAL: BILE ACIDS TOTAL: 9.1 umol/L (ref 4.7–24.5)

## 2016-10-29 ENCOUNTER — Other Ambulatory Visit: Payer: Self-pay | Admitting: Women's Health

## 2016-10-29 DIAGNOSIS — O24119 Pre-existing diabetes mellitus, type 2, in pregnancy, unspecified trimester: Secondary | ICD-10-CM

## 2016-10-29 DIAGNOSIS — O30049 Twin pregnancy, dichorionic/diamniotic, unspecified trimester: Secondary | ICD-10-CM

## 2016-10-30 ENCOUNTER — Ambulatory Visit (INDEPENDENT_AMBULATORY_CARE_PROVIDER_SITE_OTHER): Payer: Medicare HMO | Admitting: Obstetrics and Gynecology

## 2016-10-30 ENCOUNTER — Encounter: Payer: Self-pay | Admitting: Obstetrics and Gynecology

## 2016-10-30 ENCOUNTER — Ambulatory Visit (INDEPENDENT_AMBULATORY_CARE_PROVIDER_SITE_OTHER): Payer: Medicare HMO

## 2016-10-30 VITALS — BP 122/82 | HR 102 | Wt >= 6400 oz

## 2016-10-30 DIAGNOSIS — Z6841 Body Mass Index (BMI) 40.0 and over, adult: Secondary | ICD-10-CM

## 2016-10-30 DIAGNOSIS — O30042 Twin pregnancy, dichorionic/diamniotic, second trimester: Secondary | ICD-10-CM | POA: Diagnosis not present

## 2016-10-30 DIAGNOSIS — Z3A23 23 weeks gestation of pregnancy: Secondary | ICD-10-CM

## 2016-10-30 DIAGNOSIS — Z331 Pregnant state, incidental: Secondary | ICD-10-CM

## 2016-10-30 DIAGNOSIS — O09892 Supervision of other high risk pregnancies, second trimester: Secondary | ICD-10-CM

## 2016-10-30 DIAGNOSIS — R319 Hematuria, unspecified: Secondary | ICD-10-CM

## 2016-10-30 DIAGNOSIS — O0992 Supervision of high risk pregnancy, unspecified, second trimester: Secondary | ICD-10-CM

## 2016-10-30 DIAGNOSIS — Z1389 Encounter for screening for other disorder: Secondary | ICD-10-CM

## 2016-10-30 DIAGNOSIS — O24112 Pre-existing diabetes mellitus, type 2, in pregnancy, second trimester: Secondary | ICD-10-CM

## 2016-10-30 DIAGNOSIS — O30049 Twin pregnancy, dichorionic/diamniotic, unspecified trimester: Secondary | ICD-10-CM

## 2016-10-30 DIAGNOSIS — O2602 Excessive weight gain in pregnancy, second trimester: Secondary | ICD-10-CM | POA: Insufficient documentation

## 2016-10-30 DIAGNOSIS — O0990 Supervision of high risk pregnancy, unspecified, unspecified trimester: Secondary | ICD-10-CM

## 2016-10-30 DIAGNOSIS — O24119 Pre-existing diabetes mellitus, type 2, in pregnancy, unspecified trimester: Secondary | ICD-10-CM

## 2016-10-30 DIAGNOSIS — O30043 Twin pregnancy, dichorionic/diamniotic, third trimester: Secondary | ICD-10-CM

## 2016-10-30 LAB — POCT URINALYSIS DIPSTICK
GLUCOSE UA: NEGATIVE
Ketones, UA: NEGATIVE
Nitrite, UA: NEGATIVE
Protein, UA: NEGATIVE

## 2016-10-30 NOTE — Progress Notes (Signed)
Heather [name] is a 41 y.o. female High Risk Pregnancy HROB Diagnosis(es):   Di-Di Twins, Type2DM, AMA, morbid obesity BMI 70+  G2P0010 [redacted]w[redacted]d Estimated Date of Delivery: 02/22/17    HPI: The patient is being seen today for ongoing management as above.  Today she has no complaints. Pt has gained 39 lbs in 11 weeks. She states she is still working at her job 3 hours a day. Patient reports good fetal movement, denies any bleeding and no rupture of membranes symptoms or regular contractions.   BP weight and urine results reviewed and noted. Blood pressure 122/82, pulse (!) 102, weight (!) 437 lb (198.2 kg), last menstrual period 05/18/2016. Body mass index is 77.41 kg/m.   Fundal Height:   Fetal Heart rate:  Baby A: 154 bpm, Baby B: 140 bpm Physical Examination: Abdomen - soft, nontender, nondistended, no masses or organomegaly                                     Pelvic - not indicated                                     Edema:  ??  Urinalysis: large blood, 2+ leukocytes Fetal Surveillance Testing today:  U/S  Lab and sonogram results have been reviewed.   Assessment:  1.  Pregnancy at [redacted]w[redacted]d,  G2P0010   :  Estimated Date of Delivery: 02/22/17                        2.  Di-Di Twins, Type2DM, AMA, morbid obesity                         Medication(s) Plans:  Baby asa, metformin 500mg  daily  Treatment Plan:  Growth u/s @24 , 28, 32, 35, 38wks      Fetal echo 24-28wks-referral faxed today    2x/wk testing @ 32wks      Deliver @ 38wks Continue working at job up to 4 hours/day. Schedule appointment with dietician.  Follow up in 1 week for appointment for high risk OB care,  Di-Di Twins, Type2DM, AMA, morbid obesity    By signing my name below, I, Izna Ahmed, attest that this documentation has been prepared under the direction and in the presence of Jonnie Kind, MD. Electronically Signed: Jabier Gauss, ED Scribe. 10/30/16. 12:13 PM.  I personally performed the services described  in this documentation, which was SCRIBED in my presence. The recorded information has been reviewed and considered accurate. It has been edited as necessary during review. Jonnie Kind, MD

## 2016-10-30 NOTE — Progress Notes (Signed)
[place] 23+4 wks,cx 5.9 cm,bilat adnexa's wnl BABY A: cephalic left,ant pl gr 0, fhr 154 bpm,svp of fluid 6 cm,efw 630 g 48% BABY B: transverse head left,ant pl gr 0,fhr 140 bpm,svp of fluid 5.7 cm,efw 557 g 33.5%,discordance 11.5 %

## 2016-11-01 LAB — URINE CULTURE

## 2016-11-04 ENCOUNTER — Other Ambulatory Visit: Payer: Medicare HMO

## 2016-11-05 ENCOUNTER — Ambulatory Visit (INDEPENDENT_AMBULATORY_CARE_PROVIDER_SITE_OTHER): Payer: Medicare HMO | Admitting: Allergy & Immunology

## 2016-11-05 ENCOUNTER — Encounter: Payer: Self-pay | Admitting: Allergy & Immunology

## 2016-11-05 VITALS — BP 138/68 | HR 88 | Temp 97.6°F | Resp 16 | Ht 63.58 in | Wt >= 6400 oz

## 2016-11-05 DIAGNOSIS — J4531 Mild persistent asthma with (acute) exacerbation: Secondary | ICD-10-CM | POA: Insufficient documentation

## 2016-11-05 DIAGNOSIS — J3089 Other allergic rhinitis: Secondary | ICD-10-CM | POA: Diagnosis not present

## 2016-11-05 DIAGNOSIS — J4530 Mild persistent asthma, uncomplicated: Secondary | ICD-10-CM

## 2016-11-05 DIAGNOSIS — O2686 Pruritic urticarial papules and plaques of pregnancy (PUPPP): Secondary | ICD-10-CM

## 2016-11-05 MED ORDER — CETIRIZINE HCL 10 MG PO TABS: 10.0000 mg | ORAL_TABLET | Freq: Two times a day (BID) | ORAL | 3 refills | Status: DC

## 2016-11-05 MED ORDER — TRIAMCINOLONE ACETONIDE 0.1 % EX OINT: 1.0000 "application " | TOPICAL_OINTMENT | Freq: Two times a day (BID) | CUTANEOUS | 1 refills | Status: DC

## 2016-11-05 NOTE — Progress Notes (Signed)
FOLLOW UP  Date of Service/Encounter:  11/05/16   Assessment:   Mild persistent asthma complicated by pregnancy  Perennial allergic rhinitis  PUPP (pruritic urticarial papules and plaques of pregnancy)   Asthma Reportables:  Severity: mild persistent  Risk: high due to current twin gestation Control: well controlled   Plan/Recommendations:   1. Mild persistent asthma - complicated by pregnancy - Testing showed mild obstruction, which is consistent with asthma. - I anticipate that some of the shortness of breath is due to the pregnancy itself.  - We did give you a nebulizer treatment with improvement in symptoms. - Daily controller medication(s): Pulmicort 173mg two puffs twice daily + Singulair 116mdaily - Rescue medications: ProAir 4 puffs every 4-6 hours as needed - Changes during respiratory infections or worsening symptoms: increase Pulmicort 18024mto 4 puffs twice daily for TWO WEEKS. - Asthma control goals:  * Full participation in all desired activities (may need albuterol before activity) * Albuterol use two time or less a week on average (not counting use with activity) * Cough interfering with sleep two time or less a month * Oral steroids no more than once a year * No hospitalizations  2. Perennial allergic rhinitis - Continue with nasal saline rinses 1-2 times daily. - Hopefully the addition of the Zyrtec (cetirizine) will help.  3. Pruritic urticarial papules and plaques of pregnancy (PUPPP) - Start Zyrtec (cetirizine) 49m74mice daily. - Start triamcinolone ointment twice daily as needed to the worst areas.  - This is not a dangerous condition for the girls, but it is quite irritating.  4. Return in about 2 months (around 01/05/2017).   Subjective:   Heather [name] is a 41 y56. female presenting today for follow up of  Chief Complaint  Patient presents with  . Allergies  . Asthma  . Rash    Heather [name] has a history of the  following: Patient Active Problem List   Diagnosis Date Noted  . PUPP (pruritic urticarial papules and plaques of pregnancy) 11/05/2016  . Perennial allergic rhinitis 11/05/2016  . Mild persistent asthma with acute exacerbation 11/05/2016  . Excess weight gain in pregnancy, second trimester 10/30/2016  . Diabetes mellitus complicating pregnancy, antepartum 09/05/2016  . Supervision of high risk pregnancy, antepartum 08/06/2016  . Dichorionic diamniotic twin pregnancy, antepartum 08/06/2016  . AMA (advanced maternal age) multigravida 35+ 07/19/2016  . Anxiety and depression 11/07/2015  . Hard to intubate 11/07/2015  . Hypoglycemia 11/07/2015  . OSA (obstructive sleep apnea) 11/07/2015  . DM type 2 (diabetes mellitus, type 2) (HCC)[place]/31/2016  . Panniculitis 12/06/2014  . Sepsis (HCC)[place]/08/2014  . Cellulitis, abdominal wall 11/11/2014  . Abdominal pain 07/14/2014  . Loose stools 07/14/2014  . Melena 01/27/2014  . Eosinophilic esophagitis 04/181/01/7510Bowel habit changes 04/28/2013  . Esophageal dysphagia 04/28/2013  . Insomnia due to mental disorder(327.02) 08/08/2011  . RLS (restless legs syndrome) 08/08/2011  . Adjustment disorder with depressed mood 08/06/2011  . Schizoaffective disorder, bipolar type (HCC)[place]/26/2013    Class: Acute  . Amenorrhea   . Dysmenorrhea   . PALPITATIONS, OCCASIONAL 11/01/2009  . Bipolar disorder (HCC)[place]/14/2010  . DISORDER, TOBACCO USE 08/25/2008  . Leukocytosis 07/28/2008  . ALLERGIC RHINITIS, SEASONAL 08/20/2007  . DYSMETABOLIC SYNDROME 02/025/85/2778Morbid obesity with BMI of 70 and over, adult (HCC)[place]/08/2006  . EXTERNAL HEMORRHOIDS 05/13/2006  . HYPERLIPIDEMIA 05/12/2006  . Essential hypertension 05/12/2006  . ASTHMA 05/12/2006  . GERD (gastroesophageal reflux  disease) 05/12/2006  . OSTEOARTHRITIS 05/12/2006    History obtained from: chart review and patient.  Heather [name] was referred by Lucianne Lei, MD.     Heather [name] is a 41  y.o. female presenting for a follow up visit. She was last seen in April 2018. At that time, she did have evidence of mild obstructive disease on spirometry, and we decided to avoid prednisone since she was pregnant. Her rhinitis was not well controlled and I recommended purchasing Rhinocort since this was a class B nasal steroid.   Since the last visit, she has had worsening symptoms over the last few weeks. She has been short of breath over the last couple of weeks. She has been using it around once per week. She remains on Pulmicort 131mg two puffs twice daily. She is unsure whether the albuterol actually helps, however, and blamed the SOB on the pregnancy. 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   She did not need antibiotics following the last visit  For her sinusitis. She was never able to find Rhinocort. Instead she is not using any nasal sprays. She has been using Benadryl for her skin itching. She has a pruritic rash in her lower abdomen. She has tried treating this with hydrocortisone with minimal relief. She does take Benadryl at night to help with the itching.  Her pregnancy has been going well. She does see a high risk OB. She does have pictures today of the twin girls. Heather [name] does have a history of mental health problems including depression and anxiety. She is currently receiving disability due to these problems which she has been on since 1999. She met her current husband via groups she was participating in for her mental health disorders. Otherwise, there have been no changes to her past medical history, surgical history, family history, or social history.    Review of Systems: a 14-point review of systems is pertinent for what is mentioned in HPI.  Otherwise, all other systems were negative. Constitutional: negative other than that listed in the HPI Eyes: negative other than that listed in the HPI Ears, nose, mouth, throat, and face: negative other than that listed in  the HPI Respiratory: negative other than that listed in the HPI Cardiovascular: negative other than that listed in the HPI Gastrointestinal: negative other than that listed in the HPI Genitourinary: negative other than that listed in the HPI Integument: negative other than that listed in the HPI Hematologic: negative other than that listed in the HPI Musculoskeletal: negative other than that listed in the HPI Neurological: negative other than that listed in the HPI Allergy/Immunologic: negative other than that listed in the HPI    Objective:   Blood pressure 138/68, pulse 88, temperature 97.6 F (36.4 C), temperature source Oral, resp. rate 16, height 5' 3.58" (1.615 m), weight (!) 435 lb (197.3 kg), last menstrual period 05/18/2016, SpO2 98 %. Body mass index is 75.65 kg/m.   Physical Exam:  General: Alert, interactive, in no acute distress. Morbidly obese female. Pleasant and smiling.  Eyes: No conjunctival injection present on the right, No conjunctival injection present on the left, PERRL bilaterally, No discharge on the right, No discharge on the left and No Horner-Trantas dots present Ears: Right TM pearly gray with normal light reflex, Left TM pearly gray with normal light reflex, Right TM intact without perforation and Left TM intact without perforation.  Nose/Throat: External nose within normal limits and septum midline, turbinates edematous with clear  discharge, post-pharynx markedly erythematous with cobblestoning in the posterior oropharynx. Tonsils 2+ without exudates Neck: Supple without thyromegaly. Lungs: Clear to auscultation without wheezing, rhonchi or rales. No increased work of breathing. CV: Normal S1/S2, no murmurs. Capillary refill <2 seconds.  Skin: Pruritic papules with excoriations over her lower abdomen. Neuro:   Grossly intact. No focal deficits appreciated. Responsive to questions.   Diagnostic studies:   Spirometry: results abnormal (FEV1: 1.84/65%,  FVC: 2.85/86%, FEV1/FVC: 64%).    Spirometry consistent with mild obstructive disease. Overall her values are stable from those obtained at the last visit. Since she was not overtly wheezing, we did not give an albuterol nebulizer.   Allergy Studies: none     Salvatore Marvel, MD [place] [facility] [place]

## 2016-11-05 NOTE — Patient Instructions (Addendum)
1. Mild persistent asthma - complicated by pregnancy - Testing showed mild obstruction, which is consistent with asthma. - I anticipate that some of the shortness of breath is due to the pregnancy itself.  - We did give you a nebulizer treatment with improvement in symptoms. - Daily controller medication(s): Pulmicort 141mcg two puffs twice daily + Singulair 10mg  daily - Rescue medications: ProAir 4 puffs every 4-6 hours as needed - Changes during respiratory infections or worsening symptoms: increase Pulmicort 150mcg to 4 puffs twice daily for TWO WEEKS. - Asthma control goals:  * Full participation in all desired activities (may need albuterol before activity) * Albuterol use two time or less a week on average (not counting use with activity) * Cough interfering with sleep two time or less a month * Oral steroids no more than once a year * No hospitalizations  2. Perennial allergic rhinitis - Continue with nasal saline rinses 1-2 times daily. - Hopefully the addition of the Zyrtec (cetirizine) will help.  3. Pruritic urticarial papules and plaques of pregnancy (PUPPP) - Start Zyrtec (cetirizine) 10mg  twice daily. - Start triamcinolone ointment twice daily as needed to the worst areas.  - This is not a dangerous condition for the girls, but it is quite irritating.  4. Return in about 2 months (around 01/05/2017).  Please inform us of any Emergency Department visits, hospitalizations, or changes in symptoms. Call us before going to the ED for breathing or allergy symptoms since we might be able to fit you in for a sick visit. Feel free to contact us anytime with any questions, problems, or concerns.  It was a pleasure to see you again today! Enjoy the rest of the summer and best of luck with the pregnancy!   Websites that have reliable patient information: 1. American Academy of Asthma, Allergy, and Immunology: www.aaaai.org 2. Food Allergy Research and Education (FARE):  foodallergy.org 3. Mothers of Asthmatics: http://www.asthmacommunitynetwork.org 4. American College of Allergy, Asthma, and Immunology: www.acaai.org

## 2016-11-08 ENCOUNTER — Encounter: Payer: Self-pay | Admitting: Obstetrics and Gynecology

## 2016-11-08 ENCOUNTER — Ambulatory Visit (INDEPENDENT_AMBULATORY_CARE_PROVIDER_SITE_OTHER): Payer: Medicare HMO | Admitting: Obstetrics and Gynecology

## 2016-11-08 VITALS — BP 118/80 | HR 100 | Wt >= 6400 oz

## 2016-11-08 DIAGNOSIS — O24112 Pre-existing diabetes mellitus, type 2, in pregnancy, second trimester: Secondary | ICD-10-CM

## 2016-11-08 DIAGNOSIS — Z331 Pregnant state, incidental: Secondary | ICD-10-CM

## 2016-11-08 DIAGNOSIS — O30042 Twin pregnancy, dichorionic/diamniotic, second trimester: Secondary | ICD-10-CM

## 2016-11-08 DIAGNOSIS — Z6841 Body Mass Index (BMI) 40.0 and over, adult: Secondary | ICD-10-CM

## 2016-11-08 DIAGNOSIS — Z1389 Encounter for screening for other disorder: Secondary | ICD-10-CM

## 2016-11-08 DIAGNOSIS — O30049 Twin pregnancy, dichorionic/diamniotic, unspecified trimester: Secondary | ICD-10-CM

## 2016-11-08 DIAGNOSIS — O09892 Supervision of other high risk pregnancies, second trimester: Secondary | ICD-10-CM

## 2016-11-08 DIAGNOSIS — O0990 Supervision of high risk pregnancy, unspecified, unspecified trimester: Secondary | ICD-10-CM

## 2016-11-08 LAB — POCT URINALYSIS DIPSTICK
Glucose, UA: NEGATIVE
Ketones, UA: NEGATIVE
Nitrite, UA: NEGATIVE

## 2016-11-08 NOTE — Progress Notes (Signed)
Patient ID: Heather [name], female   DOB: 1975-09-22, 41 y.o.   MRN: [id number]  High Risk Pregnancy Diagnosis(es):   Di-Di Twins, Type2DM, AMA, morbid obesity BMI 70+  G2P0010 [redacted]w[redacted]d Estimated Date of Delivery: 02/22/17  Blood pressure 118/80, pulse 100, weight (!) 432 lb 11.2 oz (196.3 kg), last menstrual period 05/18/2016. 5 pound weight loss Urinalysis: Not done   HPI:   Pt reports losing 5 pounds since last visit. She notes not having time to go to the pool. She notes that she brought her blood sugar readings to the office today and none of her fasting levels are above 95. , And all postprandials are under 110 Pt also brought in her food journal. Denies any other symptoms at this time.     BP weight and urine results all reviewed and noted. Patient reports good fetal movement, denies any bleeding and no rupture of membranes symptoms or regular contractions.  Fundal Height:  Not taken due to body habitus  Fetal Heart rate:  Baby 1: 152, Baby 2: unable to hear FHR, but both fetuses were visualized on bedside US Edema:  negative  Patient is without complaints. All questions were answered.  Assessment:  [redacted]w[redacted]d,   Di-Di Twins, Type2DM, AMA, morbid obesity BMI 70+  Medication(s) Plans:   Treatment Plan:  Continue keeping blood sugar and food journal log  Follow up in 2 weeks for OB appt,   By signing my name below, I, Margit Banda, attest that this documentation has been prepared under the direction and in the presence of Jonnie Kind, MD. Electronically Signed: Margit Banda, Medical Scribe. 11/08/16. 12:11 PM.  I personally performed the services described in this documentation, which was SCRIBED in my presence. The recorded information has been reviewed and considered accurate. It has been edited as necessary during review. Jonnie Kind, MD

## 2016-11-15 DIAGNOSIS — M546 Pain in thoracic spine: Secondary | ICD-10-CM | POA: Diagnosis not present

## 2016-11-15 DIAGNOSIS — M9902 Segmental and somatic dysfunction of thoracic region: Secondary | ICD-10-CM | POA: Diagnosis not present

## 2016-11-15 DIAGNOSIS — M542 Cervicalgia: Secondary | ICD-10-CM | POA: Diagnosis not present

## 2016-11-15 DIAGNOSIS — M545 Low back pain: Secondary | ICD-10-CM | POA: Diagnosis not present

## 2016-11-15 DIAGNOSIS — M9903 Segmental and somatic dysfunction of lumbar region: Secondary | ICD-10-CM | POA: Diagnosis not present

## 2016-11-15 DIAGNOSIS — M9901 Segmental and somatic dysfunction of cervical region: Secondary | ICD-10-CM | POA: Diagnosis not present

## 2016-11-18 DIAGNOSIS — F332 Major depressive disorder, recurrent severe without psychotic features: Secondary | ICD-10-CM | POA: Diagnosis not present

## 2016-11-21 DIAGNOSIS — O24119 Pre-existing diabetes mellitus, type 2, in pregnancy, unspecified trimester: Secondary | ICD-10-CM | POA: Diagnosis not present

## 2016-11-21 DIAGNOSIS — O09522 Supervision of elderly multigravida, second trimester: Secondary | ICD-10-CM | POA: Diagnosis not present

## 2016-11-21 DIAGNOSIS — O99212 Obesity complicating pregnancy, second trimester: Secondary | ICD-10-CM | POA: Diagnosis not present

## 2016-11-21 DIAGNOSIS — O24112 Pre-existing diabetes mellitus, type 2, in pregnancy, second trimester: Secondary | ICD-10-CM | POA: Diagnosis not present

## 2016-11-21 DIAGNOSIS — O30042 Twin pregnancy, dichorionic/diamniotic, second trimester: Secondary | ICD-10-CM | POA: Diagnosis not present

## 2016-11-21 DIAGNOSIS — Z3A26 26 weeks gestation of pregnancy: Secondary | ICD-10-CM | POA: Diagnosis not present

## 2016-11-21 DIAGNOSIS — O30049 Twin pregnancy, dichorionic/diamniotic, unspecified trimester: Secondary | ICD-10-CM | POA: Diagnosis not present

## 2016-11-22 ENCOUNTER — Ambulatory Visit (INDEPENDENT_AMBULATORY_CARE_PROVIDER_SITE_OTHER): Payer: Medicare HMO | Admitting: Obstetrics & Gynecology

## 2016-11-22 ENCOUNTER — Encounter: Payer: Self-pay | Admitting: Obstetrics & Gynecology

## 2016-11-22 VITALS — BP 90/50 | HR 106 | Wt >= 6400 oz

## 2016-11-22 DIAGNOSIS — Z3A26 26 weeks gestation of pregnancy: Secondary | ICD-10-CM

## 2016-11-22 DIAGNOSIS — O09892 Supervision of other high risk pregnancies, second trimester: Secondary | ICD-10-CM

## 2016-11-22 DIAGNOSIS — O99212 Obesity complicating pregnancy, second trimester: Secondary | ICD-10-CM

## 2016-11-22 DIAGNOSIS — Z331 Pregnant state, incidental: Secondary | ICD-10-CM

## 2016-11-22 DIAGNOSIS — O30049 Twin pregnancy, dichorionic/diamniotic, unspecified trimester: Secondary | ICD-10-CM

## 2016-11-22 DIAGNOSIS — O24119 Pre-existing diabetes mellitus, type 2, in pregnancy, unspecified trimester: Secondary | ICD-10-CM

## 2016-11-22 DIAGNOSIS — O30042 Twin pregnancy, dichorionic/diamniotic, second trimester: Secondary | ICD-10-CM

## 2016-11-22 DIAGNOSIS — Z1389 Encounter for screening for other disorder: Secondary | ICD-10-CM

## 2016-11-22 DIAGNOSIS — O0990 Supervision of high risk pregnancy, unspecified, unspecified trimester: Secondary | ICD-10-CM

## 2016-11-22 DIAGNOSIS — O24112 Pre-existing diabetes mellitus, type 2, in pregnancy, second trimester: Secondary | ICD-10-CM

## 2016-11-22 LAB — POCT URINALYSIS DIPSTICK
Blood, UA: 2
GLUCOSE UA: NEGATIVE
Leukocytes, UA: NEGATIVE
Nitrite, UA: NEGATIVE
PROTEIN UA: 2

## 2016-11-22 NOTE — Progress Notes (Signed)
Fetal Surveillance Testing today:  FHR x 2: 142/152   High Risk Pregnancy Diagnosis(es):   Di/Di twins, Class A2/B DM, AMA, morbid obesity  G2P0010 [redacted]w[redacted]d Estimated Date of Delivery: 02/22/17  Blood pressure (!) 90/50, pulse (!) 106, weight (!) 435 lb (197.3 kg), last menstrual period 05/18/2016.  Urinalysis: Positive for 2+ protein with normal BP   HPI: The patient is being seen today for ongoing management of as above. Today she reports CBG are excellent   BP weight and urine results all reviewed and noted. Patient reports good fetal movement, denies any bleeding and no rupture of membranes symptoms or regular contractions.  Fundal Height:  ? Fetal Heart rate:  142/152 Edema:  1+  Patient is without complaints other than noted in her HPI. All questions were answered.  All lab and sonogram results have been reviewed. Comments:    Assessment:  1.  Pregnancy at [redacted]w[redacted]d,  Estimated Date of Delivery: 02/22/17 :                          2.  DiDi twins                        3.  Class A2/B DM                        4.  Morbid obesity  Medication(s) Plans:  Metformin 500 qAM  Treatment Plan:  Sonogram 2 weeks with twice weekly testing starting at 32 weeks, also PN2 minus the glucola  Return in about 2 weeks (around 12/06/2016) for Twins sonogram x2, , HROB. for appointment for high risk OB care  No orders of the defined types were placed in this encounter.  Orders Placed This Encounter  Procedures  . US OB Follow Up  . US OB Follow Up  . POCT urinalysis dipstick

## 2016-11-28 ENCOUNTER — Encounter: Payer: Self-pay | Admitting: "Endocrinology

## 2016-11-28 ENCOUNTER — Ambulatory Visit (INDEPENDENT_AMBULATORY_CARE_PROVIDER_SITE_OTHER): Payer: Medicare HMO | Admitting: "Endocrinology

## 2016-11-28 DIAGNOSIS — Z3A28 28 weeks gestation of pregnancy: Secondary | ICD-10-CM | POA: Diagnosis not present

## 2016-11-28 NOTE — Patient Instructions (Signed)
Advice for Weight Management -For most of us the best way to lose weight is by diet management. Generally speaking, diet management means restricting carbohydrate consumption to minimum possible (and to unprocessed or minimally processed complex starch) and increasing protein intake (animal or plant source), fruits, and vegetables.  -Sticking to a routine mealtime to eat 3 meals a day and avoiding unnecessary snacks is shown to have a big role in weight control.  -It is better to avoid simple carbohydrates including: Cakes, Sweet Desserts, Ice Cream, Soda (diet and regular), Sweet Tea, Candies, Chips, Cookies, Store Bought Juices, Alcohol in Excess of  1-2 drinks a day, Artificial Sweeteners, and "Sugar-free" Products.   -Exercise: 30 -60 minutes a day 3-4 days a week, or 150 minutes a week. Combine stretch, strength, and aerobic activities. You may seek evaluation by your heart doctor prior to initiating exercise if you have high risk for heart disease.  -If you are interested, we can schedule a visit with [name] Crumpton, RDN, CDE for individualized nutrition education.  

## 2016-11-28 NOTE — Progress Notes (Signed)
HPI: Heather [name] is a 41 y.o.-year-old female, referred by her  Ob/Gyn provider, Dr. Glo Herring, for evaluation for endocrine causes for obesity. - She is being followed up for high risk twin  pregnancy, complicated by morbid obesity. - She reports that she has been overweight/obese for most of her adult life. - After several years of unsuccessful attempt to conceive, earlier this year she was able to lose significant amount of weight (around 100 pounds), which helped her achieve pregnancy, currently at 28 weeks of gestation. - She reports pregnancy happening naturally. - Over the last several weeks she was observed to gain rapid body weight from as low as 398 pounds  prior to conception to her current weight of  437 pounds. She weighed 412 pounds at gestation week 15 of her pregnancy.   - Patient admits to dietary indiscretion, consuming large quantities of sweets including ice cream, various forms of soda, Sweet tea, candies, chips, cookies, artificial sweeteners. - She also admits that her portion sizes are large, and does not follow any particular meal pattern. She is following with a dietitian in [place], but  reportedly has not been consistent with her follow-ups. - She has history of prediabetes currently on metformin 500 mg by mouth twice a day.  I do not have recent A1c on her.  Lab Results  Component Value Date   HGBA1C 5.5 12/06/2014    - No h/o hypothyroidism. Last thyroid tests: Lab Results  Component Value Date   TSH 2.088 11/11/2014   - last set of lipids: Lab Results  Component Value Date   CHOL 142 06/30/2008   HDL 36 (L) 06/30/2008   LDLCALC 54 06/30/2008   TRIG 259 (H) 06/30/2008   CHOLHDL 3.9 Ratio 06/30/2008    I reviewed her chart and she also has a history of Hypertension, asthma, obstructive sleep apnea, gastroesophageal reflux disease.  Pt has FH of diabetes and one of her grandparents, mood disorder in mother, hypertension in her sister.   Past  Medical History:  Diagnosis Date  . Amenorrhea   . Anxiety   . Anxiety   . Asthma   . COPD (chronic obstructive pulmonary disease) ([place])   . Depression   . Depression   . Diabetes mellitus without complication ([place])   . Dysmenorrhea   . Dysrhythmia    DR Johnsie Cancel    . Ectopic pregnancy 2013  . Eosinophilic esophagitis    Diagnosed at [facility]16[facility], untreated  . GERD (gastroesophageal reflux disease)    HEARTBURN   TUMS  . Hard to intubate 11/07/2015  . Leukocytosis 07/28/2008   Qualifier: Diagnosis of  By: Jonna Munro MD, Roderic Scarce    . Morbid obesity ([place])   . Neuromuscular disorder ([facility])    RESTLESS LEG   . Obesity   . Schizoaffective disorder, bipolar type ([place])   . Shortness of breath    WITH EXERTION   . Sleep apnea    CPAP   Past Surgical History:  Procedure Laterality Date  . CHOLECYSTECTOMY    . DENTAL SURGERY    . ESOPHAGOGASTRODUODENOSCOPY  May 2007   Dr. Gala Romney: Normal esophagus, stomach, D1, D2  . ESOPHAGOGASTRODUODENOSCOPY  06/16/2013   Dr. Carlton Adam, eosinophilic esophagitis, reactive gastropathy, no esophageal dilation  . TONSILLECTOMY    . TOOTH EXTRACTION  10/28/2011   Procedure: DENTAL RESTORATION/EXTRACTIONS;  Surgeon: Gae Bon, DDS;  Location: [facility] OR;  Service: Oral Surgery;;   Social History   Social History  . Marital status: Married  Spouse name: N/A  . Number of children: 0  . Years of education: N/A   Occupational History  . volunteer    Social History Main Topics  . Smoking status: Former Smoker    Packs/day: 0.50    Years: 8.00    Types: Cigarettes    Quit date: 04/25/2011  . Smokeless tobacco: Never Used  . Alcohol use No  . Drug use: No  . Sexual activity: Yes    Birth control/ protection: None     Comment: stopped smoking in jan.. had a pack the other day   Other Topics Concern  . None   Social History Narrative  . None   Outpatient Encounter Prescriptions as of 11/28/2016  Medication Sig  . albuterol (PROVENTIL  HFA;VENTOLIN HFA) 108 (90 Base) MCG/ACT inhaler Inhale 2 puffs into the lungs every 4 (four) hours as needed for wheezing or shortness of breath.  Marland Kitchen acetaminophen (TYLENOL) 500 MG tablet Take 1,000 mg by mouth every 6 (six) hours as needed. pain  . buPROPion (WELLBUTRIN XL) 300 MG 24 hr tablet Take 300 mg by mouth every morning.   . busPIRone (BUSPAR) 10 MG tablet Take 15 mg by mouth 2 (two) times daily. For anxiety  . cetirizine (ZYRTEC) 10 MG tablet Take 1 tablet (10 mg total) by mouth 2 (two) times daily.  . Cholecalciferol (VITAMIN D3) 5000 units CAPS Take 10,000 Units by mouth daily.   . diphenhydrAMINE (BENADRYL) 25 MG tablet Take 25 mg by mouth once as needed for itching.   . montelukast (SINGULAIR) 10 MG tablet Take 1 tablet (10 mg total) by mouth at bedtime.  . pantoprazole (PROTONIX) 40 MG tablet TAKE 1 TABLET BY MOUTH DAILY  . Prenatal Vit-Fe Fumarate-FA (PRENATAL MULTIVITAMIN) TABS Take 1 tablet by mouth daily.   Marland Kitchen triamcinolone ointment (KENALOG) 0.1 % Apply 1 application topically 2 (two) times daily.  . vitamin E 400 UNIT capsule Take 400 Units by mouth daily.  . [DISCONTINUED] albuterol (PROAIR HFA) 108 (90 Base) MCG/ACT inhaler Inhale 2 puffs into the lungs every 4 (four) hours as needed for wheezing or shortness of breath (cough). (Patient not taking: Reported on 11/22/2016)  . [DISCONTINUED] budesonide (PULMICORT FLEXHALER) 180 MCG/ACT inhaler Inhale 2 puffs into the lungs 2 (two) times daily. (Patient not taking: Reported on 11/22/2016)  . [DISCONTINUED] loratadine (CLARITIN) 10 MG tablet TAKE 1 TABLET BY MOUTH EVERY DAY FOR RUNNY NOSE (Patient not taking: Reported on 11/22/2016)  . [DISCONTINUED] metFORMIN (GLUCOPHAGE) 500 MG tablet Take 500 mg by mouth daily with breakfast.  . [DISCONTINUED] promethazine (PHENERGAN) 25 MG tablet Take 1 tablet (25 mg total) by mouth every 6 (six) hours as needed for nausea or vomiting. (Patient not taking: Reported on 11/05/2016)    Facility-Administered Encounter Medications as of 11/28/2016  Medication  . [COMPLETED] ipratropium (ATROVENT) nebulizer solution 0.5 mg   ALLERGIES: Allergies  Allergen Reactions  . Bee Venom Shortness Of Breath  . Penicillins Anaphylaxis    Has patient had a PCN reaction causing immediate rash, facial/tongue/throat swelling, SOB or lightheadedness with hypotension: No Has patient had a PCN reaction causing severe rash involving mucus membranes or skin necrosis: No Has patient had a PCN reaction that required hospitalization No Has patient had a PCN reaction occurring within the last 10 years: No If all of the above answers are "NO", then may proceed with Cephalosporin use.'  REACTION: Angioedema  . Adhesive [Tape] Rash  . Latex Rash  . Vancomycin Other (See Comments)  Pt can tolerate Vancomycin but did cause Red-Man Syndrome.  Recommend to pre-medicate with Benadryl before doses administered.     VACCINATION STATUS: Immunization History  Administered Date(s) Administered  . H1N1 03/08/2008  . Influenza Whole 04/28/2006, 01/21/2008  . Influenza,inj,Quad PF,6+ Mos 02/20/2016  . Pneumococcal Polysaccharide-23 03/08/2008   ROS: Constitutional: +   weight gain, + fatigue, no subjective hyperthermia, no subjective hypothermia Eyes: no blurry vision, no xerophthalmia ENT: no sore throat, no nodules palpated in throat, no dysphagia/odynophagia, no hoarseness Cardiovascular: no Chest Pain, no Shortness of Breath, no palpitations, no leg swelling Respiratory: no cough, no SOB Gastrointestinal: no Nausea/Vomiting/Diarhhea Musculoskeletal: no muscle/joint aches Skin: no rashes Neurological: no tremors, no numbness, no tingling, no dizziness Psychiatric: no depression, no anxiety  PE: BP (!) 148/86   Pulse 97   Ht 5' 3.58" (1.615 m)   Wt (!) 437 lb (198.2 kg)   LMP 05/18/2016 (Exact Date)   BMI 76.01 kg/m  Wt Readings from Last 3 Encounters:  11/28/16 (!) 437 lb (198.2 kg)   11/22/16 (!) 435 lb (197.3 kg)  11/08/16 (!) 432 lb 11.2 oz (196.3 kg)   Constitutional:  + Morbid obesity with early third trimester pregnancy at 28 weeks of gestation , not in acute distress, normal state of mind Eyes: PERRLA, EOMI, no exophthalmos ENT: + Edentulous, moist mucous membranes, no thyromegaly, no cervical lymphadenopathy Cardiovascular: normal precordial activity, Regular Rate and Rhythm, no Murmur/Rubs/Gallops Respiratory:  adequate breathing efforts, no gross chest deformity, Clear to auscultation bilaterally Gastrointestinal: +abdomen is obese, gravid, soft, Non -tender,  Bowel Sounds present Musculoskeletal: + Large extremities,  no gross edema , strength intact in all four extremities Skin: moist, warm, no rashes Neurological: no tremor with outstretched hands, Deep tendon reflexes normal in all four extremities.   ASSESSMENT: 1. Morbid Obesity 2. High Risk Third trimester pregnancy-28 weeks with twins   PLAN: - Based on her reported and documented history she has been overweight/obese for most of her adult life. He was able to lose approximately 100 pounds , down to 398 pounds, prior to her successful conception, and since then she has progressively gained 37 pounds. - Suspicion for any endocrine cause a weight gain is low at this time and they likely etiology for rapid weight gain is excessive calorie Intake. - Due to her concurrent pregnancy, she is not a candidate for medications nor surgery. - At this time, the only viable choice left for her to slow down on the weight gain is caloric restriction.  I discussed diet in detail, reviewing each of her food choices and advised for healthy alternatives. -Suggestion is made for her to avoid simple carbohydrates  from her diet including Cakes, Sweet Desserts, Ice Cream, Soda (diet and regular), Sweet Tea, doughnuts, pancakes, pizza, Candies, Chips, Cookies, Store Bought Juices, Alcohol, Artificial Sweeteners, and  "Sugar-free" Products.  - I encouraged her to switch to  unprocessed or minimally processed complex starch and increased protein intake (animal or plant source), fruits, and vegetables.  - She will return in 1 week to review her new labs and assess her progress. I have advised her to discontinue metformin for now. She will be considered for insulin treatment if necessary based on her next A1c. She is also asked to return with her meter and logs to review.  - Given her high risk for late pregnancy complications posed by her particular heavy weight,  I advised her to maintain [place] follow up with her OB/GYN provider Dr. Glo Herring.  -  Mrs. Cerreta may need Preterm delivery when appropriate to minimize the risks for preeclampsia, maternal, and perinatal complications.   - Time spent with the patient: 1 hour, of which >50% was spent in obtaining information about her symptoms, examining her, reviewing her previous labs, evaluations, and treatments, counseling her about obesity, high risk pregnancy, and developing a  dietary plan to slow down weight gain to effect optimal outcome of her  Pregnancy.  Glade Lloyd, MD [facility]  This note was partially dictated with voice recognition software. Similar sounding words can be transcribed inadequately or may not  be corrected upon review.

## 2016-11-29 LAB — T4, FREE: FREE T4: 1.2 ng/dL (ref 0.8–1.8)

## 2016-11-29 LAB — TSH: TSH: 1.74 mIU/L

## 2016-11-29 LAB — HEMOGLOBIN A1C
Hgb A1c MFr Bld: 5.1 % (ref <5.7)
Mean Plasma Glucose: 100 mg/dL

## 2016-12-05 ENCOUNTER — Encounter: Payer: Self-pay | Admitting: "Endocrinology

## 2016-12-05 ENCOUNTER — Ambulatory Visit (INDEPENDENT_AMBULATORY_CARE_PROVIDER_SITE_OTHER): Payer: Medicare HMO | Admitting: "Endocrinology

## 2016-12-05 DIAGNOSIS — Z3A29 29 weeks gestation of pregnancy: Secondary | ICD-10-CM

## 2016-12-05 NOTE — Progress Notes (Signed)
HPI: Heather [name] is a 41 y.o.-year-old female.  She is returning for follow-up for obesity , she is pregnant at [redacted] weeks of gestation. - She is being followed up for high risk twin  pregnancy, complicated by morbid obesity. - She reports that she has been overweight/obese for most of her adult life. - After several years of unsuccessful attempt to conceive, earlier this year she was able to lose significant amount of weight (around 100 pounds), which helped her achieve pregnancy, currently at 29 weeks of gestation. - She reports pregnancy happening naturally. - Over the last several weeks she was observed to gain rapid body weight from as low as 398 pounds  prior to conception to her current weight of  437 pounds. She weighed 412 pounds at gestation week 15 of her pregnancy.   - She was sent for labs during her last visit, showing A1c of 5.1% and normal thyroid function. - Patient admits she did not make any changes in her dietary habit due to death in the family. She is still continues to have significant dietary indiscretion, consuming large quantities of sweets including ice cream, various forms of soda, Sweet tea, candies, chips, cookies, artificial sweeteners. She has gained 3 pounds since last visit. - She also admits that her portion sizes are large, and does not follow any particular meal pattern. She is following with a dietitian in [place], but  reportedly has not been consistent with her follow-ups.   Lab Results  Component Value Date   HGBA1C 5.1 11/28/2016    - No h/o hypothyroidism. Last thyroid tests: Lab Results  Component Value Date   TSH 1.74 11/28/2016   FREET4 1.2 11/28/2016   - last set of lipids: Lab Results  Component Value Date   CHOL 142 06/30/2008   HDL 36 (L) 06/30/2008   LDLCALC 54 06/30/2008   TRIG 259 (H) 06/30/2008   CHOLHDL 3.9 Ratio 06/30/2008    I reviewed her chart and she also has a history of Hypertension, asthma, obstructive sleep apnea,  gastroesophageal reflux disease.  Pt has FH of diabetes and one of her grandparents, mood disorder in mother, hypertension in her sister.   Past Medical History:  Diagnosis Date  . Amenorrhea   . Anxiety   . Anxiety   . Asthma   . COPD (chronic obstructive pulmonary disease) ([place])   . Depression   . Depression   . Diabetes mellitus without complication ([place])   . Dysmenorrhea   . Dysrhythmia    DR Johnsie Cancel    . Ectopic pregnancy 2013  . Eosinophilic esophagitis    Diagnosed at [facility]/16/2013, untreated  . GERD (gastroesophageal reflux disease)    HEARTBURN   TUMS  . Hard to intubate 11/07/2015  . Leukocytosis 07/28/2008   Qualifier: Diagnosis of  By: Jonna Munro MD, Roderic Scarce    . Morbid obesity ([place])   . Neuromuscular disorder ([facility])    RESTLESS LEG   . Obesity   . Schizoaffective disorder, bipolar type ([place])   . Shortness of breath    WITH EXERTION   . Sleep apnea    CPAP   Past Surgical History:  Procedure Laterality Date  . CHOLECYSTECTOMY    . DENTAL SURGERY    . ESOPHAGOGASTRODUODENOSCOPY  May 2007   Dr. Gala Romney: Normal esophagus, stomach, D1, D2  . ESOPHAGOGASTRODUODENOSCOPY  06/16/2013   Dr. Carlton Adam, eosinophilic esophagitis, reactive gastropathy, no esophageal dilation  . TONSILLECTOMY    . TOOTH EXTRACTION  10/28/2011  Procedure: DENTAL RESTORATION/EXTRACTIONS;  Surgeon: Gae Bon, DDS;  Location: [facility] OR;  Service: Oral Surgery;;   Social History   Social History  . Marital status: Married    Spouse name: N/A  . Number of children: 0  . Years of education: N/A   Occupational History  . volunteer    Social History Main Topics  . Smoking status: Former Smoker    Packs/day: 0.50    Years: 8.00    Types: Cigarettes    Quit date: 04/25/2011  . Smokeless tobacco: Never Used  . Alcohol use No  . Drug use: No  . Sexual activity: Yes    Birth control/ protection: None     Comment: stopped smoking in jan.. had a pack the other day   Other Topics  Concern  . None   Social History Narrative  . None   Outpatient Encounter Prescriptions as of 12/05/2016  Medication Sig  . acetaminophen (TYLENOL) 500 MG tablet Take 1,000 mg by mouth every 6 (six) hours as needed. pain  . albuterol (PROVENTIL HFA;VENTOLIN HFA) 108 (90 Base) MCG/ACT inhaler Inhale 2 puffs into the lungs every 4 (four) hours as needed for wheezing or shortness of breath.  Marland Kitchen buPROPion (WELLBUTRIN XL) 300 MG 24 hr tablet Take 300 mg by mouth every morning.   . busPIRone (BUSPAR) 10 MG tablet Take 15 mg by mouth 2 (two) times daily. For anxiety  . cetirizine (ZYRTEC) 10 MG tablet Take 1 tablet (10 mg total) by mouth 2 (two) times daily.  . Cholecalciferol (VITAMIN D3) 5000 units CAPS Take 10,000 Units by mouth daily.   . diphenhydrAMINE (BENADRYL) 25 MG tablet Take 25 mg by mouth once as needed for itching.   . montelukast (SINGULAIR) 10 MG tablet Take 1 tablet (10 mg total) by mouth at bedtime.  . pantoprazole (PROTONIX) 40 MG tablet TAKE 1 TABLET BY MOUTH DAILY  . Prenatal Vit-Fe Fumarate-FA (PRENATAL MULTIVITAMIN) TABS Take 1 tablet by mouth daily.   Marland Kitchen triamcinolone ointment (KENALOG) 0.1 % Apply 1 application topically 2 (two) times daily.  . vitamin E 400 UNIT capsule Take 400 Units by mouth daily.   Facility-Administered Encounter Medications as of 12/05/2016  Medication  . [COMPLETED] ipratropium (ATROVENT) nebulizer solution 0.5 mg   ALLERGIES: Allergies  Allergen Reactions  . Bee Venom Shortness Of Breath  . Penicillins Anaphylaxis    Has patient had a PCN reaction causing immediate rash, facial/tongue/throat swelling, SOB or lightheadedness with hypotension: No Has patient had a PCN reaction causing severe rash involving mucus membranes or skin necrosis: No Has patient had a PCN reaction that required hospitalization No Has patient had a PCN reaction occurring within the last 10 years: No If all of the above answers are "NO", then may proceed with Cephalosporin  use.'  REACTION: Angioedema  . Adhesive [Tape] Rash  . Latex Rash  . Vancomycin Other (See Comments)    Pt can tolerate Vancomycin but did cause Red-Man Syndrome.  Recommend to pre-medicate with Benadryl before doses administered.     VACCINATION STATUS: Immunization History  Administered Date(s) Administered  . H1N1 03/08/2008  . Influenza Whole 04/28/2006, 01/21/2008  . Influenza,inj,Quad PF,6+ Mos 02/20/2016  . Pneumococcal Polysaccharide-23 03/08/2008   ROS: Constitutional: +   weight gain, + fatigue, no subjective hyperthermia, no subjective hypothermia Eyes: no blurry vision, no xerophthalmia ENT: no sore throat, no nodules palpated in throat, no dysphagia/odynophagia, no hoarseness Cardiovascular: no Chest Pain, no Shortness of Breath, no palpitations, no leg  swelling Respiratory: no cough, no SOB Gastrointestinal: no Nausea/Vomiting/Diarhhea Musculoskeletal: no muscle/joint aches Skin: no rashes Neurological: no tremors, no numbness, no tingling, no dizziness Psychiatric: no depression, no anxiety  PE: BP 136/82   Pulse 94   Ht 5' 3.58" (1.615 m)   Wt (!) 440 lb (199.6 kg)   LMP 05/18/2016 (Exact Date)   BMI 76.53 kg/m  Wt Readings from Last 3 Encounters:  12/05/16 (!) 440 lb (199.6 kg)  11/28/16 (!) 437 lb (198.2 kg)  11/22/16 (!) 435 lb (197.3 kg)   Constitutional:  + Morbid obesity with early third trimester pregnancy at 28 weeks of gestation , not in acute distress, normal state of mind Eyes: PERRLA, EOMI, no exophthalmos ENT: + Edentulous, moist mucous membranes, no thyromegaly, no cervical lymphadenopathy Cardiovascular: normal precordial activity, Regular Rate and Rhythm, no Murmur/Rubs/Gallops Respiratory:  adequate breathing efforts, no gross chest deformity, Clear to auscultation bilaterally Gastrointestinal: +abdomen is obese, gravid, soft, Non -tender,  Bowel Sounds present Musculoskeletal: + Large extremities,  no gross edema , strength intact in  all four extremities Skin: moist, warm, no rashes Neurological: no tremor with outstretched hands, Deep tendon reflexes normal in all four extremities.  Recent Results (from the past 2160 hour(s))  CBC with Differential/Platelet     Status: Abnormal   Collection Time: 09/18/16  5:31 PM  Result Value Ref Range   WBC 13.5 (H) 4.0 - 10.5 K/uL   RBC 4.02 3.87 - 5.11 MIL/uL   Hemoglobin 10.7 (L) 12.0 - 15.0 g/dL   HCT 34.3 (L) 36.0 - 46.0 %   MCV 85.3 78.0 - 100.0 fL   MCH 26.6 26.0 - 34.0 pg   MCHC 31.2 30.0 - 36.0 g/dL   RDW 16.5 (H) 11.5 - 15.5 %   Platelets 319 150 - 400 K/uL   Neutrophils Relative % 77 %   Neutro Abs 10.4 (H) 1.7 - 7.7 K/uL   Lymphocytes Relative 15 %   Lymphs Abs 2.0 0.7 - 4.0 K/uL   Monocytes Relative 3 %   Monocytes Absolute 0.4 0.1 - 1.0 K/uL   Eosinophils Relative 5 %   Eosinophils Absolute 0.6 0.0 - 0.7 K/uL   Basophils Relative 0 %   Basophils Absolute 0.0 0.0 - 0.1 K/uL  POCT urinalysis dipstick     Status: None   Collection Time: 09/25/16 11:07 AM  Result Value Ref Range   Color, UA     Clarity, UA     Glucose, UA neg    Bilirubin, UA     Ketones, UA neg    Spec Grav, UA  1.010 - 1.025   Blood, UA neg    pH, UA  5.0 - 8.0   Protein, UA trace    Urobilinogen, UA  0.2 or 1.0 E.U./dL   Nitrite, UA neg    Leukocytes, UA Negative Negative  POCT Urinalysis Dipstick     Status: Abnormal   Collection Time: 10/10/16 11:14 AM  Result Value Ref Range   Color, UA     Clarity, UA     Glucose, UA neg    Bilirubin, UA     Ketones, UA neg    Spec Grav, UA  1.010 - 1.025   Blood, UA 3+    pH, UA  5.0 - 8.0   Protein, UA trace    Urobilinogen, UA  0.2 or 1.0 E.U./dL   Nitrite, UA neg    Leukocytes, UA Moderate (2+) (A) Negative  Bile acids, total  Status: None   Collection Time: 10/11/16  9:47 AM  Result Value Ref Range   Bile Acids Total 9.3 4.7 - 24.5 umol/L    Comment:                      Pregnant female reference interval                        (15 - 45 years):      3.7 - 14.5   POCT urinalysis dipstick     Status: Abnormal   Collection Time: 10/24/16  1:45 PM  Result Value Ref Range   Color, UA     Clarity, UA     Glucose, UA neg    Bilirubin, UA     Ketones, UA neg    Spec Grav, UA  1.010 - 1.025   Blood, UA large    pH, UA  5.0 - 8.0   Protein, UA 1+    Urobilinogen, UA  0.2 or 1.0 E.U./dL   Nitrite, UA neg    Leukocytes, UA Large (3+) (A) Negative  Comprehensive metabolic panel     Status: Abnormal   Collection Time: 10/25/16 10:26 AM  Result Value Ref Range   Glucose 81 65 - 99 mg/dL   BUN 6 6 - 20 mg/dL   Creatinine, Ser 0.40 (L) 0.57 - 1.00 mg/dL   GFR calc non Af Amer 133 >59 mL/min/1.73   GFR calc Af Amer 153 >59 mL/min/1.73   BUN/Creatinine Ratio 15 9 - 23   Sodium 140 134 - 144 mmol/L   Potassium 4.4 3.5 - 5.2 mmol/L   Chloride 104 96 - 106 mmol/L   CO2 20 20 - 29 mmol/L   Calcium 9.3 8.7 - 10.2 mg/dL   Total Protein 6.4 6.0 - 8.5 g/dL   Albumin 3.6 3.5 - 5.5 g/dL   Globulin, Total 2.8 1.5 - 4.5 g/dL   Albumin/Globulin Ratio 1.3 1.2 - 2.2   Bilirubin Total 0.2 0.0 - 1.2 mg/dL   Alkaline Phosphatase 58 39 - 117 IU/L   AST 20 0 - 40 IU/L   ALT 26 0 - 32 IU/L  Bile acids, total     Status: None   Collection Time: 10/25/16 10:27 AM  Result Value Ref Range   Bile Acids Total 9.1 4.7 - 24.5 umol/L    Comment:                      Pregnant female reference interval                       (15 - 45 years):      3.7 - 14.5   POCT urinalysis dipstick     Status: Abnormal   Collection Time: 10/30/16 12:00 PM  Result Value Ref Range   Color, UA     Clarity, UA     Glucose, UA neg    Bilirubin, UA     Ketones, UA neg    Spec Grav, UA  1.010 - 1.025   Blood, UA large    pH, UA  5.0 - 8.0   Protein, UA neg    Urobilinogen, UA  0.2 or 1.0 E.U./dL   Nitrite, UA neg    Leukocytes, UA Moderate (2+) (A) Negative  Urine Culture     Status: None   Collection Time: 10/30/16  3:00 PM  Result Value Ref  Range   Urine Culture, Routine Final report    Organism ID, Bacteria Comment     Comment: Greater than 2 organisms recovered, none predominant. Please submit another sample if clinically indicated. 50,000-100,000 colony forming units per mL   POCT urinalysis dipstick     Status: Abnormal   Collection Time: 11/08/16 12:42 PM  Result Value Ref Range   Color, UA     Clarity, UA     Glucose, UA neg    Bilirubin, UA     Ketones, UA neg    Spec Grav, UA  1.010 - 1.025   Blood, UA large    pH, UA  5.0 - 8.0   Protein, UA 1+    Urobilinogen, UA  0.2 or 1.0 E.U./dL   Nitrite, UA neg    Leukocytes, UA Large (3+) (A) Negative  POCT urinalysis dipstick     Status: None   Collection Time: 11/22/16 12:21 PM  Result Value Ref Range   Color, UA     Clarity, UA     Glucose, UA neg    Bilirubin, UA     Ketones, UA small    Spec Grav, UA  1.010 - 1.025   Blood, UA 2    pH, UA  5.0 - 8.0   Protein, UA 2    Urobilinogen, UA  0.2 or 1.0 E.U./dL   Nitrite, UA neg    Leukocytes, UA Negative Negative  Hemoglobin A1c     Status: None   Collection Time: 11/28/16  2:39 PM  Result Value Ref Range   Hgb A1c MFr Bld 5.1 <5.7 %    Comment:   For the purpose of screening for the presence of diabetes:   <5.7%       Consistent with the absence of diabetes 5.7-6.4 %   Consistent with increased risk for diabetes (prediabetes) >=6.5 %     Consistent with diabetes   This assay result is consistent with a decreased risk of diabetes.   Currently, no consensus exists regarding use of hemoglobin A1c for diagnosis of diabetes in children.   According to American Diabetes Association (ADA) guidelines, hemoglobin A1c <7.0% represents optimal control in non-pregnant diabetic patients. Different metrics may apply to specific patient populations. Standards of Medical Care in Diabetes (ADA).      Mean Plasma Glucose 100 mg/dL  T4, free     Status: None   Collection Time: 11/28/16  2:39 PM  Result Value  Ref Range   Free T4 1.2 0.8 - 1.8 ng/dL  TSH     Status: None   Collection Time: 11/28/16  2:39 PM  Result Value Ref Range   TSH 1.74 mIU/L    Comment:   Reference Range   > or = 20 Years  0.40-4.50   Pregnancy Range First trimester  0.26-2.66 Second trimester 0.55-2.73 Third trimester  0.43-2.91        ASSESSMENT: 1. Morbid Obesity 2. High Risk Third trimester pregnancy-28 weeks with twins   PLAN: - Based on her reported and documented history,  she has been overweight/obese for most of her adult life. She was able to lose approximately 100 pounds , down to 398 pounds, prior to her successful conception, and since then she has progressively gained 40 pounds. - Suspicion for any endocrine cause a weight gain is low at this time and the likely etiology for rapid weight gain is excessive calorie Intake. - She has normal thyroid function test, A1c is 5.1%. - Due  to her concurrent pregnancy, she is not a candidate for weight loss medications nor surgery. - At this time, the only viable choice left for her  to slow down on the weight gain still is caloric restriction.  I discussed diet in detail once again, reviewing each of her food choices and advised for healthy alternatives. -Suggestion is made for her to avoid simple carbohydrates  from her diet including Cakes, Sweet Desserts, Ice Cream, Soda (diet and regular), Sweet Tea, doughnuts, pancakes, pizza, Candies, Chips, Cookies, Store Bought Juices, Alcohol, Artificial Sweeteners, and "Sugar-free" Products.  - I encouraged her to switch to  unprocessed or minimally processed complex starch and increased protein intake (animal or plant source), fruits, and vegetables.  - She will return in 4 weeks for revaluation. - I have advised her to stay off of metformin for now.  - Given her high risk for late pregnancy complications posed by her particular heavy weight,  I advised her to maintain [place] follow up with her OB/GYN provider Dr.  Glo Herring.  -  Heather [name] may need Preterm delivery when appropriate to minimize the risks for preeclampsia, maternal, and perinatal complications.   Glade Lloyd, MD [facility]  This note was partially dictated with voice recognition software. Similar sounding words can be transcribed inadequately or may not  be corrected upon review.

## 2016-12-06 ENCOUNTER — Other Ambulatory Visit: Payer: Medicare HMO

## 2016-12-06 ENCOUNTER — Ambulatory Visit (INDEPENDENT_AMBULATORY_CARE_PROVIDER_SITE_OTHER): Payer: Medicare HMO

## 2016-12-06 ENCOUNTER — Encounter: Payer: Self-pay | Admitting: Obstetrics and Gynecology

## 2016-12-06 ENCOUNTER — Ambulatory Visit (INDEPENDENT_AMBULATORY_CARE_PROVIDER_SITE_OTHER): Payer: Medicare HMO | Admitting: Obstetrics and Gynecology

## 2016-12-06 VITALS — BP 100/60 | HR 80 | Wt >= 6400 oz

## 2016-12-06 DIAGNOSIS — O30049 Twin pregnancy, dichorionic/diamniotic, unspecified trimester: Secondary | ICD-10-CM

## 2016-12-06 DIAGNOSIS — O24113 Pre-existing diabetes mellitus, type 2, in pregnancy, third trimester: Secondary | ICD-10-CM | POA: Diagnosis not present

## 2016-12-06 DIAGNOSIS — O09523 Supervision of elderly multigravida, third trimester: Secondary | ICD-10-CM

## 2016-12-06 DIAGNOSIS — Z1389 Encounter for screening for other disorder: Secondary | ICD-10-CM

## 2016-12-06 DIAGNOSIS — O0990 Supervision of high risk pregnancy, unspecified, unspecified trimester: Secondary | ICD-10-CM

## 2016-12-06 DIAGNOSIS — Z3A28 28 weeks gestation of pregnancy: Secondary | ICD-10-CM

## 2016-12-06 DIAGNOSIS — O99213 Obesity complicating pregnancy, third trimester: Secondary | ICD-10-CM

## 2016-12-06 DIAGNOSIS — O30043 Twin pregnancy, dichorionic/diamniotic, third trimester: Secondary | ICD-10-CM | POA: Diagnosis not present

## 2016-12-06 DIAGNOSIS — Z331 Pregnant state, incidental: Secondary | ICD-10-CM

## 2016-12-06 DIAGNOSIS — O24119 Pre-existing diabetes mellitus, type 2, in pregnancy, unspecified trimester: Secondary | ICD-10-CM

## 2016-12-06 LAB — POCT URINALYSIS DIPSTICK
Blood, UA: 3
Glucose, UA: NEGATIVE
Ketones, UA: NEGATIVE
NITRITE UA: NEGATIVE
PROTEIN UA: 1

## 2016-12-06 NOTE — Progress Notes (Signed)
[place] 28+6 wks,DI/DI TWINS: BABY A: cephalic left,anterior pl gr 0,svp of fluid 7.1 cm,fhr 152 bpm,1274 g 50%                                               BABY B: trans right,ant pl gr 0,svp of fluid 7.7 cm,fhr 138 bpm,EFW 1276 g 50%,discordance .12%

## 2016-12-06 NOTE — Progress Notes (Signed)
C the note below, growth is symmetric babies then 1 ounce of each other and patient's doing fine

## 2016-12-06 NOTE — Progress Notes (Signed)
Heather [name] is a 41 y.o. female High Risk Pregnancy HROB Diagnosis(es):   Di/Di twins, , AMA, morbid obesity  G2P0010 [redacted]w[redacted]d Estimated Date of Delivery: 02/22/17    HPI: The patient is being seen today for ongoing management of as above Today she reports no complaints. Pt has been to Dr Dorris Fetch who discontinued her Metformin. She is c/o of diarrhea for about 1 week, and is concerned about it. She has had a death in the family and is emotionally distressed. The patient reports that when she was sent to Dr. Dorris Fetch that he reviewed her records and that she did not need to take diabetic medicines. Hemoglobin A1c was 5 Patient was on metformin prior to pregnancy for a  reason unrelated to diabetes  Patient reports good fetal movement, denies any bleeding and no rupture of membranes symptoms or regular contractions.   BP weight and urine results reviewed and noted. Blood pressure 100/60, pulse 80, weight (!) 438 lb 3.2 oz (198.8 kg), last menstrual period 05/18/2016.  Fundal Height:  U + 38 Fetal Heart rate:  Baby A:152, Baby B: 138 Physical Examination: Abdomen - soft, nontender, nondistended, no masses or organomegaly                                     Pelvic - not indicated                                     Edema:  none  Urinalysis: 3+ blood, 1+ protein, 2+ leukocytes  Fetal Surveillance Testing today:  U/S  Lab and sonogram results have been reviewed.  Assessment:  1.  Pregnancy at [redacted]w[redacted]d,  G2P0010:Estimated Date of Delivery: 02/22/17                        2.  DiDi twins                        3. Class A2/B DM   4. Morbid obesity  Medication(s) Plans:  Metformin 500 qAM  Treatment Plan:  Sonogram 2 weeks with twice weekly testing starting at 32 weeks, also PN2 minus the glucola  Follow up in 2 weeks for appointment for high risk OB care, Di/Di twins, Class A2/B DM, AMA, morbid obesity     By signing my name below, I, Izna Ahmed, attest that this documentation has been  prepared under the direction and in the presence of Jonnie Kind, MD. Electronically Signed: Jabier Gauss, Medical Scribe. 12/06/16. 12:22 PM.  I personally performed the services described in this documentation, which was SCRIBED in my presence. The recorded information has been reviewed and considered accurate. It has been edited as necessary during review. Jonnie Kind, MD

## 2016-12-07 LAB — CBC
HEMOGLOBIN: 11.1 g/dL (ref 11.1–15.9)
Hematocrit: 34.1 % (ref 34.0–46.6)
MCH: 25.8 pg — ABNORMAL LOW (ref 26.6–33.0)
MCHC: 32.6 g/dL (ref 31.5–35.7)
MCV: 79 fL (ref 79–97)
Platelets: 386 10*3/uL — ABNORMAL HIGH (ref 150–379)
RBC: 4.31 x10E6/uL (ref 3.77–5.28)
RDW: 15.6 % — ABNORMAL HIGH (ref 12.3–15.4)
WBC: 12 10*3/uL — ABNORMAL HIGH (ref 3.4–10.8)

## 2016-12-07 LAB — ANTIBODY SCREEN: ANTIBODY SCREEN: NEGATIVE

## 2016-12-07 LAB — RPR: RPR: NONREACTIVE

## 2016-12-07 LAB — HIV ANTIBODY (ROUTINE TESTING W REFLEX): HIV SCREEN 4TH GENERATION: NONREACTIVE

## 2016-12-12 ENCOUNTER — Other Ambulatory Visit: Payer: Self-pay | Admitting: Nurse Practitioner

## 2016-12-12 NOTE — Telephone Encounter (Signed)
LMOM to call.

## 2016-12-12 NOTE — Telephone Encounter (Signed)
Patient is pregnant. We last refilled her protonix in 05/2016. We last saw her in 2016.   Please have her get her protonix from GYN or we will need documentation from GYN that patient can be on protonix.

## 2016-12-13 NOTE — Telephone Encounter (Signed)
Pt called and is aware.  

## 2016-12-13 NOTE — Telephone Encounter (Signed)
LMOM to call.

## 2016-12-15 ENCOUNTER — Inpatient Hospital Stay (HOSPITAL_COMMUNITY)
Admission: AD | Admit: 2016-12-15 | Discharge: 2016-12-15 | Disposition: A | Discharge status: Home / Self Care | Payer: Medicare HMO | Source: Ambulatory Visit | Attending: Obstetrics and Gynecology | Admitting: Obstetrics and Gynecology

## 2016-12-15 ENCOUNTER — Encounter (HOSPITAL_COMMUNITY): Payer: Self-pay

## 2016-12-15 DIAGNOSIS — Z3A30 30 weeks gestation of pregnancy: Secondary | ICD-10-CM | POA: Insufficient documentation

## 2016-12-15 DIAGNOSIS — R0789 Other chest pain: Secondary | ICD-10-CM | POA: Diagnosis not present

## 2016-12-15 DIAGNOSIS — O30003 Twin pregnancy, unspecified number of placenta and unspecified number of amniotic sacs, third trimester: Secondary | ICD-10-CM | POA: Insufficient documentation

## 2016-12-15 DIAGNOSIS — R0981 Nasal congestion: Secondary | ICD-10-CM | POA: Diagnosis not present

## 2016-12-15 DIAGNOSIS — O26893 Other specified pregnancy related conditions, third trimester: Secondary | ICD-10-CM | POA: Insufficient documentation

## 2016-12-15 DIAGNOSIS — Z87891 Personal history of nicotine dependence: Secondary | ICD-10-CM | POA: Insufficient documentation

## 2016-12-15 HISTORY — DX: Sepsis, unspecified organism: A41.9

## 2016-12-15 LAB — CBC
HCT: 33.6 % — ABNORMAL LOW (ref 36.0–46.0)
Hemoglobin: 10.6 g/dL — ABNORMAL LOW (ref 12.0–15.0)
MCH: 26.1 pg (ref 26.0–34.0)
MCHC: 31.5 g/dL (ref 30.0–36.0)
MCV: 82.8 fL (ref 78.0–100.0)
Platelets: 334 10*3/uL (ref 150–400)
RBC: 4.06 MIL/uL (ref 3.87–5.11)
RDW: 16 % — ABNORMAL HIGH (ref 11.5–15.5)
WBC: 12.9 10*3/uL — ABNORMAL HIGH (ref 4.0–10.5)

## 2016-12-15 LAB — TROPONIN I: Troponin I: <0.03 ng/mL (ref <0.03)

## 2016-12-15 MED ORDER — GI COCKTAIL ~~LOC~~
30.0000 mL | Freq: Once | ORAL | Status: AC
  Administered 2016-12-15: 30 mL via ORAL
  Filled 2016-12-15: qty 30

## 2016-12-15 NOTE — MAU Provider Note (Signed)
History     CSN: [id number]  Arrival date and time: 12/15/16 1553   First Provider Initiated Contact with Patient 12/15/16 1730      Chief Complaint  Patient presents with  . Nasal Congestion  . Chest Pain   HPI Heather [name] 41 y.o. [redacted]w[redacted]d with twin gestation Comes in today with stuffy nose, chest tightness and pain, and diarrhea earlier today.  Has had diarrhea for one week but today has only had one episode.  Client agrees diarrhea is improving, but is has made her wonder if everything is OK.  Has been feeling the babies move.  Went to the grocery store today and felt like she could not breathe while she was walking around in the store.  Felt like her chest was tight and she could not get her breath.  Has an albuterol inhaler but she did not use it.  Last used a couple of days ago.  Also is having nasal stuffiness.  Thinks it is related to seasonal allergies but she has had pneumonia in the past and thought she might need to be checked.  She denies any fever today but has felt hot at night.   OB History    Gravida Para Term Preterm AB Living   2 0     1     SAB TAB Ectopic Multiple Live Births   0   1          Past Medical History:  Diagnosis Date  . Amenorrhea   . Anxiety   . Anxiety   . Asthma   . COPD (chronic obstructive pulmonary disease) ([place])   . Depression   . Depression   . Diabetes mellitus without complication ([place])   . Dysmenorrhea   . Dysrhythmia    DR Johnsie Cancel    . Ectopic pregnancy 2013  . Eosinophilic esophagitis    Diagnosed at [facility] 06/16/2013, untreated  . GERD (gastroesophageal reflux disease)    HEARTBURN   TUMS  . Hard to intubate 11/07/2015  . Leukocytosis 07/28/2008   Qualifier: Diagnosis of  By: Jonna Munro MD, Roderic Scarce    . Morbid obesity ([place])   . Neuromuscular disorder (H[facility])    RESTLESS LEG   . Obesity   . Schizoaffective disorder, bipolar type ([place])   . Shortness of breath    WITH EXERTION   . Sleep apnea    CPAP    Past Surgical  History:  Procedure Laterality Date  . CHOLECYSTECTOMY    . DENTAL SURGERY    . ESOPHAGOGASTRODUODENOSCOPY  May 2007   Dr. Gala Romney: Normal esophagus, stomach, D1, D2  . ESOPHAGOGASTRODUODENOSCOPY  06/16/2013   Dr. Carlton Adam, eosinophilic esophagitis, reactive gastropathy, no esophageal dilation  . TONSILLECTOMY    . TOOTH EXTRACTION  10/28/2011   Procedure: DENTAL RESTORATION/EXTRACTIONS;  Surgeon: Gae Bon, DDS;  Location: [facility] OR;  Service: Oral Surgery;;    Family History  Problem Relation Age of Onset  . Depression Mother   . Anxiety disorder Mother   . Hypertension Sister   . Allergic rhinitis Sister   . Colon polyps Maternal Grandmother        28s  . Diabetes Maternal Grandmother   . Anxiety disorder Maternal Grandmother   . COPD Maternal Grandmother   . Crohn's disease Maternal Aunt   . Cancer Maternal Grandfather        prostate  . HIV/AIDS Father   . Liver disease Neg Hx   . Angioedema Neg Hx   .  Eczema Neg Hx   . Immunodeficiency Neg Hx   . Asthma Neg Hx   . Urticaria Neg Hx     Social History  Substance Use Topics  . Smoking status: Former Smoker    Packs/day: 0.50    Years: 8.00    Types: Cigarettes    Quit date: 04/25/2011  . Smokeless tobacco: Never Used  . Alcohol use No    Allergies:  Allergies  Allergen Reactions  . Bee Venom Shortness Of Breath  . Penicillins Anaphylaxis    Has patient had a PCN reaction causing immediate rash, facial/tongue/throat swelling, SOB or lightheadedness with hypotension: No Has patient had a PCN reaction causing severe rash involving mucus membranes or skin necrosis: No Has patient had a PCN reaction that required hospitalization No Has patient had a PCN reaction occurring within the last 10 years: No If all of the above answers are "NO", then may proceed with Cephalosporin use.'  REACTION: Angioedema  . Adhesive [Tape] Rash  . Latex Rash  . Vancomycin Other (See Comments)    Pt can tolerate Vancomycin but did  cause Red-Man Syndrome.  Recommend to pre-medicate with Benadryl before doses administered.      Facility-Administered Medications Prior to Admission  Medication Dose Route Frequency Provider Last Rate Last Dose  . [COMPLETED] ipratropium (ATROVENT) nebulizer solution 0.5 mg  0.5 mg Nebulization Once Gean Quint, MD   0.5 mg at 03/28/15 1725   Prescriptions Prior to Admission  Medication Sig Dispense Refill Last Dose  . acetaminophen (TYLENOL) 500 MG tablet Take 1,000 mg by mouth every 6 (six) hours as needed. pain   Taking  . albuterol (PROVENTIL HFA;VENTOLIN HFA) 108 (90 Base) MCG/ACT inhaler Inhale 2 puffs into the lungs every 4 (four) hours as needed for wheezing or shortness of breath.   Not Taking  . buPROPion (WELLBUTRIN XL) 300 MG 24 hr tablet Take 300 mg by mouth every morning.   3 Taking  . busPIRone (BUSPAR) 10 MG tablet Take 15 mg by mouth 2 (two) times daily. For anxiety   Taking  . cetirizine (ZYRTEC) 10 MG tablet Take 1 tablet (10 mg total) by mouth 2 (two) times daily. 60 tablet 3 Taking  . Cholecalciferol (VITAMIN D3) 5000 units CAPS Take 10,000 Units by mouth daily.    Taking  . diphenhydrAMINE (BENADRYL) 25 MG tablet Take 25 mg by mouth once as needed for itching.    Taking  . montelukast (SINGULAIR) 10 MG tablet Take 1 tablet (10 mg total) by mouth at bedtime. 30 tablet 3 Taking  . pantoprazole (PROTONIX) 40 MG tablet TAKE 1 TABLET BY MOUTH DAILY 30 tablet 5 Taking  . Prenatal Vit-Fe Fumarate-FA (PRENATAL MULTIVITAMIN) TABS Take 1 tablet by mouth daily.    Taking  . triamcinolone ointment (KENALOG) 0.1 % Apply 1 application topically 2 (two) times daily. 30 g 1 Taking  . vitamin E 400 UNIT capsule Take 400 Units by mouth daily.   Taking    Review of Systems  Constitutional: Negative for fever.  HENT:       Nasal stuffiness Has seasonal allergies  Gastrointestinal: Positive for diarrhea. Negative for abdominal pain, nausea and vomiting.       Feels abdominal  tightness  Genitourinary: Negative for vaginal bleeding and vaginal discharge.  Musculoskeletal:       No redness in legs Legs are large bilaterally but no change noted by client in the past week.   Physical Exam   Blood pressure 132/83,  pulse (!) 114, temperature 97.9 F (36.6 C), temperature source Oral, resp. rate 20, last menstrual period 05/18/2016, SpO2 100 %.  Physical Exam  Nursing note and vitals reviewed. Constitutional: She is oriented to person, place, and time. She appears well-developed and well-nourished.  Morbidly obese  HENT:  Head: Normocephalic.  Eyes: EOM are normal.  Neck: Neck supple.  Cardiovascular: Normal rate, regular rhythm and normal heart sounds.   No murmur heard. EKG done  Respiratory: Effort normal and breath sounds normal. No respiratory distress. She has no wheezes. She has no rales.  GI: Soft. There is no tenderness.  2 babies traced on fetal monitor strip One baby had baseline of 150 with moderate variablitiy and 10x10 accels Another baby had baseline of 145 with moderate variability and 10x10 accels Reassuring strip.  No decelerations.  No contractions traced or palpated.  Musculoskeletal: Normal range of motion.  Neurological: She is alert and oriented to person, place, and time.  Skin: Skin is warm and dry.  Psychiatric: She has a normal mood and affect.    MAU Course  Procedures Results for orders placed or performed during the hospital encounter of 12/15/16 (from the past 24 hour(s))  CBC     Status: Abnormal   Collection Time: 12/15/16  6:05 PM  Result Value Ref Range   WBC 12.9 (H) 4.0 - 10.5 K/uL   RBC 4.06 3.87 - 5.11 MIL/uL   Hemoglobin 10.6 (L) 12.0 - 15.0 g/dL   HCT 33.6 (L) 36.0 - 46.0 %   MCV 82.8 78.0 - 100.0 fL   MCH 26.1 26.0 - 34.0 pg   MCHC 31.5 30.0 - 36.0 g/dL   RDW 16.0 (H) 11.5 - 15.5 %   Platelets 334 150 - 400 K/uL  Troponin I     Status: None   Collection Time: 12/15/16  6:05 PM  Result Value Ref Range    Troponin I <0.03 <0.03 ng/mL    MDM Call placed to cardiology to review EKG.  Dr. Ilda Basset consulted for plan of care.  Labs ordered.  O2 sat 98-100% GI Cocktail given.  Client is feeling better now that she has rested in bed.  Feels like her breathing is normal now that she is lying down.  Dr. Eula Fried, Cardiology called and reviewed EKG from today and compared to EKGs done previously in her chart - today's EKG is different only in the tachycardia which could be related to the pregnancy and twin gestation.  He recommended considering the possibility of a clot in her legs - Ddimer and CT-Angiogram.  Consult with Dr. Ilda Basset - Consider maternal echo on outpatient basis.  Will not do Ddimer today.  Assessment and Plan  30 week twin gestation Chest tightness resolved Nasal stuffiness  Plan Keep your appointment in the office this week - Friday. Take your albuteral inhaler with you and use it if you have chest tightness with exertion. Return if you have lower abdominal pain, vaginal bleeding or leaking of fluid. Use albuterol as prescribed if you think your chest is tight. Can use saline nasal spray for nasal stuffiness. There is no other treatment needed today but return if your symptoms worsen and you cannot breathe well. Return if you have vaginal bleeding or leaking or if you are having worsening abdominal pain.    Terri L Burleson 12/15/2016, 5:47 PM

## 2016-12-15 NOTE — Progress Notes (Signed)
G2P0 @ 30.[redacted] wksga with twin IUP. Presents to triage for chest pain and sinus congestion for past 2 days. Denies LOF or bleeding. +FM.   EKG ordered and done.   EFM applied.   Provider at bs assessing pt.   Medicated with GI cocktail  Pt states medicaton helped some   1932: Discharge instructions given with pt understanding. Pt left unit with family.

## 2016-12-15 NOTE — Discharge Instructions (Signed)
Use albuterol as prescribed if you think your chest is tight. Can use saline nasal spray for nasal stuffiness. Keep your appointment in the office this week. There is no other treatment needed today but return if your symptoms worsen and you cannot breathe well. Return if you have vaginal bleeding or leaking or if you are having worsening abdominal pain.

## 2016-12-15 NOTE — MAU Note (Signed)
Pt C/O nasal congestion & chest tightness for the last 2 days.  Feeling SOB with walking.  Coughing up clear mucus.  Denies fever or sorethroat   Has had diarrhea this past week, none today.  Denies vomiting or abdominal pain.  No bleeding or LOF.

## 2016-12-20 ENCOUNTER — Encounter: Payer: Self-pay | Admitting: Women's Health

## 2016-12-20 ENCOUNTER — Ambulatory Visit (INDEPENDENT_AMBULATORY_CARE_PROVIDER_SITE_OTHER): Payer: Medicare HMO | Admitting: Women's Health

## 2016-12-20 VITALS — BP 110/60 | HR 95 | Wt >= 6400 oz

## 2016-12-20 DIAGNOSIS — O24113 Pre-existing diabetes mellitus, type 2, in pregnancy, third trimester: Secondary | ICD-10-CM

## 2016-12-20 DIAGNOSIS — R8299 Other abnormal findings in urine: Secondary | ICD-10-CM | POA: Diagnosis not present

## 2016-12-20 DIAGNOSIS — Z23 Encounter for immunization: Secondary | ICD-10-CM | POA: Diagnosis not present

## 2016-12-20 DIAGNOSIS — Z3A30 30 weeks gestation of pregnancy: Secondary | ICD-10-CM

## 2016-12-20 DIAGNOSIS — O288 Other abnormal findings on antenatal screening of mother: Secondary | ICD-10-CM

## 2016-12-20 DIAGNOSIS — Z6841 Body Mass Index (BMI) 40.0 and over, adult: Secondary | ICD-10-CM

## 2016-12-20 DIAGNOSIS — O0990 Supervision of high risk pregnancy, unspecified, unspecified trimester: Secondary | ICD-10-CM

## 2016-12-20 DIAGNOSIS — R82998 Other abnormal findings in urine: Secondary | ICD-10-CM

## 2016-12-20 DIAGNOSIS — Z331 Pregnant state, incidental: Secondary | ICD-10-CM

## 2016-12-20 DIAGNOSIS — O24119 Pre-existing diabetes mellitus, type 2, in pregnancy, unspecified trimester: Secondary | ICD-10-CM

## 2016-12-20 DIAGNOSIS — O30049 Twin pregnancy, dichorionic/diamniotic, unspecified trimester: Secondary | ICD-10-CM

## 2016-12-20 DIAGNOSIS — O09523 Supervision of elderly multigravida, third trimester: Secondary | ICD-10-CM

## 2016-12-20 DIAGNOSIS — O30043 Twin pregnancy, dichorionic/diamniotic, third trimester: Secondary | ICD-10-CM

## 2016-12-20 DIAGNOSIS — O09513 Supervision of elderly primigravida, third trimester: Secondary | ICD-10-CM

## 2016-12-20 DIAGNOSIS — O0993 Supervision of high risk pregnancy, unspecified, third trimester: Secondary | ICD-10-CM

## 2016-12-20 DIAGNOSIS — Z1389 Encounter for screening for other disorder: Secondary | ICD-10-CM

## 2016-12-20 LAB — POCT URINALYSIS DIPSTICK
Glucose, UA: NEGATIVE
KETONES UA: NEGATIVE
Nitrite, UA: NEGATIVE

## 2016-12-20 NOTE — Progress Notes (Signed)
   [facility] High-Risk Pregnancy Visit  Patient name: Heather [name] MRN [id number]  Date of birth: 12/10/1975 CC & HPI:   Heather [name] is a 41 y.o. G42P0010 female at [redacted]w[redacted]d being seen today for ongoing management of a high-risk pregnancy complicated by class  B DM, multiple gestation, AMA, morid obesity, depression  Today she reports she saw Dr. Dorris Fetch, endocrinology on 8/30, he did A1C which was 5.1, so he told her to stop the metformin. She has only checked sugars 5 days since last visit 2 weeks ago. All fbs <90 but one that was 91, all 2hr pp <120. He also checked thyroid labs which were normal. Was instructed by Dr. Dorris Fetch to decrease caloric intake as she had gained 31lbs to date during the pregnancy of recommended no more than 20lb based on her pre-gravid BMI.  Went to MAU 9/9 w/ chest pain, diarrhea, nasal congestion- ekg was normal, O2 sat 98-100%, GI cocktail given, sx completely resolved and was d/c'd in stable condition. Has felt fine since.   Review of Systems:   Reports good fetal movement x 2.  Denies regular contractions, leakage of fluid, vaginal bleeding, abnormal vaginal discharge w/ itching/odor/irritation, headaches, visual changes, shortness of breath, chest pain, abdominal pain, severe nausea/vomiting, or problems with bowel movements.  Patient is without complaints other than noted in HPI.  Pertinent History Reviewed:  Reviewed past medical,surgical, social and family history.  Reviewed medications and allergies.  Objective Findings:  BP 110/60   Pulse 95   Wt (!) 440 lb 6.4 oz (199.8 kg)   LMP 05/18/2016 (Exact Date)   BMI 78.01 kg/m    Urine dipstick: Positive for 3+ blood, 1+ protein, 2+ leuks Fundal Height:  U+38 Fetal Heart rate:  140/146 Edema:  trace Fetal Surveillance Testing today:  <32wks  Assessment & Plan:   1) High-risk pregnancy G2P0010 at [redacted]w[redacted]d   2) multiple gestation Di-Di Twins, stable  3) class  B DM, stable, baby ASA daily, off of  metformin per Dr. Dorris Fetch  4) AMA  5) Morbid obesity, increasing  6) Depression, stable, continue wellbutrin, buspar  Treatment Plan:  Growth u/s @ 28, 32, 35, 38wks   2x/wk testing @ 32wks      Deliver @ 38wks  Reviewed: ptl s/s, fkc. Check cbg's QID every day. Recommended Tdap at HD/PCP per CDC guidelines.  All questions were answered  Follow-up: Return in about 11 days (around 12/31/2016) for HROB, [place]: BPP/Dopp, US:EFW, Twins (schedule Tues HROB/US, Frid HROB/NST x 4wks).   Orders Placed This Encounter  Procedures  . Urine Culture  . US OB Follow Up AddL Gest  . US FETAL BPP WO NON STRESS  . Flu Vaccine QUAD 36+ mos IM  . POCT Urinalysis Dipstick   Flu shot today  Tawnya Crook CNM, [facility] 12/20/2016 3:29 PM

## 2016-12-20 NOTE — Patient Instructions (Signed)
Call the office ([phone]) or go to [facility] if:  You begin to have strong, frequent contractions  Your water breaks.  Sometimes it is a big gush of fluid, sometimes it is just a trickle that keeps getting your panties wet or running down your legs  You have vaginal bleeding.  It is normal to have a small amount of spotting if your cervix was checked.   You don't feel your baby moving like normal.  If you don't, get you something to eat and drink and lay down and focus on feeling your baby move.  You should feel at least 10 movements in 2 hours.  If you don't, you should call the office or go to [facility].    Tdap Vaccine  It is recommended that you get the Tdap vaccine during the third trimester of EACH pregnancy to help protect your baby from getting pertussis (whooping cough)  27-36 weeks is the BEST time to do this so that you can pass the protection on to your baby. During pregnancy is better than after pregnancy, but if you are unable to get it during pregnancy it will be offered at the hospital.   You can get this vaccine at the health department or your family doctor  Everyone who will be around your baby should also be up-to-date on their vaccines. Adults (who are not pregnant) only need 1 dose of Tdap during adulthood.   Third Trimester of Pregnancy The third trimester is from week 29 through week 42, months 7 through 9. The third trimester is a time when the fetus is growing rapidly. At the end of the ninth month, the fetus is about 20 inches in length and weighs 6-10 pounds.  BODY CHANGES Your body goes through many changes during pregnancy. The changes vary from woman to woman.   Your weight will continue to increase. You can expect to gain 25-35 pounds (11-16 kg) by the end of the pregnancy.  You may begin to get stretch marks on your hips, abdomen, and breasts.  You may urinate more often because the fetus is moving lower into your pelvis and pressing on  your bladder.  You may develop or continue to have heartburn as a result of your pregnancy.  You may develop constipation because certain hormones are causing the muscles that push waste through your intestines to slow down.  You may develop hemorrhoids or swollen, bulging veins (varicose veins).  You may have pelvic pain because of the weight gain and pregnancy hormones relaxing your joints between the bones in your pelvis. Backaches may result from overexertion of the muscles supporting your posture.  You may have changes in your hair. These can include thickening of your hair, rapid growth, and changes in texture. Some women also have hair loss during or after pregnancy, or hair that feels dry or thin. Your hair will most likely return to normal after your baby is born.  Your breasts will continue to grow and be tender. A yellow discharge may leak from your breasts called colostrum.  Your belly button may stick out.  You may feel short of breath because of your expanding uterus.  You may notice the fetus "dropping," or moving lower in your abdomen.  You may have a bloody mucus discharge. This usually occurs a few days to a week before labor begins.  Your cervix becomes thin and soft (effaced) near your due date. WHAT TO EXPECT AT YOUR PRENATAL EXAMS  You will have prenatal   exams every 2 weeks until week 36. Then, you will have weekly prenatal exams. During a routine prenatal visit:  You will be weighed to make sure you and the fetus are growing normally.  Your blood pressure is taken.  Your abdomen will be measured to track your baby's growth.  The fetal heartbeat will be listened to.  Any test results from the previous visit will be discussed.  You may have a cervical check near your due date to see if you have effaced. At around 36 weeks, your caregiver will check your cervix. At the same time, your caregiver will also perform a test on the secretions of the vaginal tissue.  This test is to determine if a type of bacteria, Group B streptococcus, is present. Your caregiver will explain this further. Your caregiver may ask you:  What your birth plan is.  How you are feeling.  If you are feeling the baby move.  If you have had any abnormal symptoms, such as leaking fluid, bleeding, severe headaches, or abdominal cramping.  If you have any questions. Other tests or screenings that may be performed during your third trimester include:  Blood tests that check for low iron levels (anemia).  Fetal testing to check the health, activity level, and growth of the fetus. Testing is done if you have certain medical conditions or if there are problems during the pregnancy. FALSE LABOR You may feel small, irregular contractions that eventually go away. These are called Braxton Hicks contractions, or false labor. Contractions may last for hours, days, or even weeks before true labor sets in. If contractions come at regular intervals, intensify, or become painful, it is best to be seen by your caregiver.  SIGNS OF LABOR   Menstrual-like cramps.  Contractions that are 5 minutes apart or less.  Contractions that start on the top of the uterus and spread down to the lower abdomen and back.  A sense of increased pelvic pressure or back pain.  A watery or bloody mucus discharge that comes from the vagina. If you have any of these signs before the 37th week of pregnancy, call your caregiver right away. You need to go to the hospital to get checked immediately. HOME CARE INSTRUCTIONS   Avoid all smoking, herbs, alcohol, and unprescribed drugs. These chemicals affect the formation and growth of the baby.  Follow your caregiver's instructions regarding medicine use. There are medicines that are either safe or unsafe to take during pregnancy.  Exercise only as directed by your caregiver. Experiencing uterine cramps is a good sign to stop exercising.  Continue to eat regular,  healthy meals.  Wear a good support bra for breast tenderness.  Do not use hot tubs, steam rooms, or saunas.  Wear your seat belt at all times when driving.  Avoid raw meat, uncooked cheese, cat litter boxes, and soil used by cats. These carry germs that can cause birth defects in the baby.  Take your prenatal vitamins.  Try taking a stool softener (if your caregiver approves) if you develop constipation. Eat more high-fiber foods, such as fresh vegetables or fruit and whole grains. Drink plenty of fluids to keep your urine clear or pale yellow.  Take warm sitz baths to soothe any pain or discomfort caused by hemorrhoids. Use hemorrhoid cream if your caregiver approves.  If you develop varicose veins, wear support hose. Elevate your feet for 15 minutes, 3-4 times a day. Limit salt in your diet.  Avoid heavy lifting, wear low heal   shoes, and practice good posture.  Rest a lot with your legs elevated if you have leg cramps or low back pain.  Visit your dentist if you have not gone during your pregnancy. Use a soft toothbrush to brush your teeth and be gentle when you floss.  A sexual relationship may be continued unless your caregiver directs you otherwise.  Do not travel far distances unless it is absolutely necessary and only with the approval of your caregiver.  Take prenatal classes to understand, practice, and ask questions about the labor and delivery.  Make a trial run to the hospital.  Pack your hospital bag.  Prepare the baby's nursery.  Continue to go to all your prenatal visits as directed by your caregiver. SEEK MEDICAL CARE IF:  You are unsure if you are in labor or if your water has broken.  You have dizziness.  You have mild pelvic cramps, pelvic pressure, or nagging pain in your abdominal area.  You have persistent nausea, vomiting, or diarrhea.  You have a bad smelling vaginal discharge.  You have pain with urination. SEEK IMMEDIATE MEDICAL CARE IF:    You have a fever.  You are leaking fluid from your vagina.  You have spotting or bleeding from your vagina.  You have severe abdominal cramping or pain.  You have rapid weight loss or gain.  You have shortness of breath with chest pain.  You notice sudden or extreme swelling of your face, hands, ankles, feet, or legs.  You have not felt your baby move in over an hour.  You have severe headaches that do not go away with medicine.  You have vision changes. Document Released: 03/19/2001 Document Revised: 03/30/2013 Document Reviewed: 05/26/2012 [employer] Patient Information 2015 [employer]. This information is not intended to replace advice given to you by your health care provider. Make sure you discuss any questions you have with your health care provider.   

## 2016-12-23 DIAGNOSIS — F332 Major depressive disorder, recurrent severe without psychotic features: Secondary | ICD-10-CM | POA: Diagnosis not present

## 2016-12-25 LAB — URINE CULTURE

## 2016-12-26 ENCOUNTER — Other Ambulatory Visit: Payer: Self-pay | Admitting: Women's Health

## 2016-12-26 DIAGNOSIS — O09523 Supervision of elderly multigravida, third trimester: Secondary | ICD-10-CM

## 2016-12-26 DIAGNOSIS — O30049 Twin pregnancy, dichorionic/diamniotic, unspecified trimester: Secondary | ICD-10-CM

## 2016-12-26 DIAGNOSIS — O24119 Pre-existing diabetes mellitus, type 2, in pregnancy, unspecified trimester: Secondary | ICD-10-CM

## 2016-12-26 DIAGNOSIS — O0993 Supervision of high risk pregnancy, unspecified, third trimester: Secondary | ICD-10-CM

## 2016-12-26 DIAGNOSIS — Z6841 Body Mass Index (BMI) 40.0 and over, adult: Secondary | ICD-10-CM

## 2016-12-30 ENCOUNTER — Ambulatory Visit (INDEPENDENT_AMBULATORY_CARE_PROVIDER_SITE_OTHER): Payer: Medicare HMO | Admitting: Women's Health

## 2016-12-30 ENCOUNTER — Ambulatory Visit (INDEPENDENT_AMBULATORY_CARE_PROVIDER_SITE_OTHER): Payer: Medicare HMO

## 2016-12-30 ENCOUNTER — Encounter: Payer: Self-pay | Admitting: Women's Health

## 2016-12-30 VITALS — BP 122/78 | HR 100 | Wt >= 6400 oz

## 2016-12-30 DIAGNOSIS — R8271 Bacteriuria: Secondary | ICD-10-CM

## 2016-12-30 DIAGNOSIS — O403XX2 Polyhydramnios, third trimester, fetus 2: Secondary | ICD-10-CM

## 2016-12-30 DIAGNOSIS — O24119 Pre-existing diabetes mellitus, type 2, in pregnancy, unspecified trimester: Secondary | ICD-10-CM

## 2016-12-30 DIAGNOSIS — Z6841 Body Mass Index (BMI) 40.0 and over, adult: Secondary | ICD-10-CM

## 2016-12-30 DIAGNOSIS — O409XX0 Polyhydramnios, unspecified trimester, not applicable or unspecified: Secondary | ICD-10-CM

## 2016-12-30 DIAGNOSIS — O30049 Twin pregnancy, dichorionic/diamniotic, unspecified trimester: Secondary | ICD-10-CM

## 2016-12-30 DIAGNOSIS — O26893 Other specified pregnancy related conditions, third trimester: Secondary | ICD-10-CM

## 2016-12-30 DIAGNOSIS — O0993 Supervision of high risk pregnancy, unspecified, third trimester: Secondary | ICD-10-CM

## 2016-12-30 DIAGNOSIS — O9989 Other specified diseases and conditions complicating pregnancy, childbirth and the puerperium: Secondary | ICD-10-CM

## 2016-12-30 DIAGNOSIS — O2343 Unspecified infection of urinary tract in pregnancy, third trimester: Secondary | ICD-10-CM | POA: Diagnosis not present

## 2016-12-30 DIAGNOSIS — O30043 Twin pregnancy, dichorionic/diamniotic, third trimester: Secondary | ICD-10-CM

## 2016-12-30 DIAGNOSIS — O24113 Pre-existing diabetes mellitus, type 2, in pregnancy, third trimester: Secondary | ICD-10-CM

## 2016-12-30 DIAGNOSIS — Z1389 Encounter for screening for other disorder: Secondary | ICD-10-CM

## 2016-12-30 DIAGNOSIS — O09523 Supervision of elderly multigravida, third trimester: Secondary | ICD-10-CM

## 2016-12-30 DIAGNOSIS — O403XX1 Polyhydramnios, third trimester, fetus 1: Secondary | ICD-10-CM

## 2016-12-30 DIAGNOSIS — O0990 Supervision of high risk pregnancy, unspecified, unspecified trimester: Secondary | ICD-10-CM

## 2016-12-30 DIAGNOSIS — Z331 Pregnant state, incidental: Secondary | ICD-10-CM

## 2016-12-30 LAB — POCT URINALYSIS DIPSTICK
Glucose, UA: NEGATIVE
KETONES UA: NEGATIVE
NITRITE UA: POSITIVE

## 2016-12-30 NOTE — Progress Notes (Signed)
[place] 32+2 wks,DI/DI twins,anterior pl gr 0 Baby A cephalic left,fhr 789 bpm,BPP 8/8,svp of 9.2 cm (polyhydramnios),efw 2034 g 53% Baby B transverse head left,fhr 161 bpm,svp of fluid 10.9 cm (polyhydramnios),efw 1788 g 35%,discordance 12%

## 2016-12-30 NOTE — Progress Notes (Signed)
   [facility] High-Risk Pregnancy Visit  Patient name: Heather [name] MRN [id number]  Date of birth: [birth year] CC & HPI:  Heather [name] is a 41 y.o. G24P0010 female at [redacted]w[redacted]d with an Estimated Date of Delivery: 02/22/17 being seen today for ongoing management of a high-risk pregnancy complicated by Class B DM, Di-Di Twin gestation, AMA, morbid obesity, depression, and now polyhydramnios x 2.   Today she reports all sugars normal, has log.    Review of Systems:   Reports good fetal movement x 2. Denies regular contractions, leakage of fluid, vaginal bleeding, abnormal vaginal discharge w/ itching/odor/irritation, headaches, visual changes, shortness of breath, chest pain, abdominal pain, severe nausea/vomiting, or problems with urination or bowel movements.   Pertinent History Reviewed:  Reviewed past medical,surgical and family history.  Reviewed problem list, medications and allergies.  Objective Findings:   Vitals:   12/30/16 1518  BP: 122/78  Pulse: 100  Weight: (!) 453 lb 6.4 oz (205.7 kg)    Body mass index is 80.32 kg/m.  Fundal Height:  U+39 Fetal Heart rate:  144/161 u/s Edema:  trace Fetal Surveillance Testing today:  U/S: [place] 32+2 wks,DI/DI twins,anterior pl gr 0 Baby A cephalic left,fhr 182 bpm,BPP 8/8,svp of 9.2 cm (polyhydramnios),efw 2034 g 53% Baby B transverse head left,fhr 161 bpm,svp of fluid 10.9 cm (polyhydramnios),efw 1788 g 35%,discordance 12%  Results for orders placed or performed in visit on 12/30/16 (from the past 24 hour(s))  POCT urinalysis dipstick   Collection Time: 12/30/16  3:23 PM  Result Value Ref Range   Color, UA     Clarity, UA     Glucose, UA neg    Bilirubin, UA     Ketones, UA neg    Spec Grav, UA  1.010 - 1.025   Blood, UA large    pH, UA  5.0 - 8.0   Protein, UA trace    Urobilinogen, UA  0.2 or 1.0 E.U./dL   Nitrite, UA positive    Leukocytes, UA Moderate (2+) (A) Negative     Assessment & Plan:   1) High-risk  pregnancy G2P0010 at [redacted]w[redacted]d with an Estimated Date of Delivery: 02/22/17  2) Class B DM, stable, off meds per Dr. Dorris Fetch 3) Multiple gestation, Di-Di Twins, stable, discordance 12% on today' u/s 4) AMA 5) Morbid obesity> 13lb weight gain since last visit> discussed decreasing carbs/calories, increasing exercise> no more weight gain 6) Depression> stable, continue wellbutrin, buspar 7) Polyhydramnios x 2> new dx, already in antenatal testing 8) Asymptomatic bacteruria> rx macrobid bid x 7d, send urine cx  Treatment Plan:  Growth u/s @ 35, 38wks    2x/wk testing nst alt w/ sono    Deliver @ 38wks  Reviewed: ptl s/s, fkc All questions were answered  Return for As scheduled. Thurs for HROB/NST  Orders Placed This Encounter  Procedures  . Urine Culture  . POCT urinalysis dipstick     Tawnya Crook CNM, [facility] 12/30/16

## 2016-12-31 ENCOUNTER — Other Ambulatory Visit: Payer: Medicare HMO

## 2016-12-31 ENCOUNTER — Encounter: Payer: Medicare HMO | Admitting: Obstetrics & Gynecology

## 2016-12-31 ENCOUNTER — Encounter: Payer: Self-pay | Admitting: Women's Health

## 2016-12-31 DIAGNOSIS — O9989 Other specified diseases and conditions complicating pregnancy, childbirth and the puerperium: Secondary | ICD-10-CM

## 2016-12-31 DIAGNOSIS — O409XX0 Polyhydramnios, unspecified trimester, not applicable or unspecified: Secondary | ICD-10-CM | POA: Insufficient documentation

## 2016-12-31 DIAGNOSIS — R8271 Bacteriuria: Secondary | ICD-10-CM | POA: Insufficient documentation

## 2016-12-31 MED ORDER — NITROFURANTOIN MONOHYD MACRO 100 MG PO CAPS: 100.0000 mg | ORAL_CAPSULE | Freq: Two times a day (BID) | ORAL | 0 refills | Status: DC

## 2016-12-31 NOTE — Patient Instructions (Signed)
Call the office [phone]) or go to [facility] if:  You begin to have strong, frequent contractions  Your water breaks.  Sometimes it is a big gush of fluid, sometimes it is just a trickle that keeps getting your panties wet or running down your legs  You have vaginal bleeding.  It is normal to have a small amount of spotting if your cervix was checked.   You don't feel your baby moving like normal.  If you don't, get you something to eat and drink and lay down and focus on feeling your baby move.  You should feel at least 10 movements in 2 hours.  If you don't, you should call the office or go to [place] and Birth Information The normal length of a pregnancy is 39-41 weeks. Preterm labor is when labor starts before 37 completed weeks of pregnancy. What are the risk factors for preterm labor? Preterm labor is more likely to occur in women who:  Have certain infections during pregnancy such as a bladder infection, sexually transmitted infection, or infection inside the uterus (chorioamnionitis).  Have a shorter-than-normal cervix.  Have gone into preterm labor before.  Have had surgery on their cervix.  Are younger than age 39 or older than age 54.  Are African American.  Are pregnant with twins or multiple babies (multiple gestation).  Take street drugs or smoke while pregnant.  Do not gain enough weight while pregnant.  Became pregnant shortly after having been pregnant.  What are the symptoms of preterm labor? Symptoms of preterm labor include:  Cramps similar to those that can happen during a menstrual period. The cramps may happen with diarrhea.  Pain in the abdomen or lower back.  Regular uterine contractions that may feel like tightening of the abdomen.  A feeling of increased pressure in the pelvis.  Increased watery or bloody mucus discharge from the vagina.  Water breaking (ruptured amniotic sac).  Why is it important to  recognize signs of preterm labor? It is important to recognize signs of preterm labor because babies who are born prematurely may not be fully developed. This can put them at an increased risk for:  Long-term (chronic) heart and lung problems.  Difficulty immediately after birth with regulating body systems, including blood sugar, body temperature, heart rate, and breathing rate.  Bleeding in the brain.  Cerebral palsy.  Learning difficulties.  Death.  These risks are highest for babies who are born before 64 weeks of pregnancy. How is preterm labor treated? Treatment depends on the length of your pregnancy, your condition, and the health of your baby. It may involve:  Having a stitch (suture) placed in your cervix to prevent your cervix from opening too early (cerclage).  Taking or being given medicines, such as: ? Hormone medicines. These may be given early in pregnancy to help support the pregnancy. ? Medicine to stop contractions. ? Medicines to help mature the baby's lungs. These may be prescribed if the risk of delivery is high. ? Medicines to prevent your baby from developing cerebral palsy.  If the labor happens before 34 weeks of pregnancy, you may need to stay in the hospital. What should I do if I think I am in preterm labor? If you think that you are going into preterm labor, call your health care provider right away. How can I prevent preterm labor in future pregnancies? To increase your chance of having a full-term pregnancy:  Do not use  any tobacco products, such as cigarettes, chewing tobacco, and e-cigarettes. If you need help quitting, ask your health care provider.  Do not use street drugs or medicines that have not been prescribed to you during your pregnancy.  Talk with your health care provider before taking any herbal supplements, even if you have been taking them regularly.  Make sure you gain a healthy amount of weight during your pregnancy.  Watch  for infection. If you think that you might have an infection, get it checked right away.  Make sure to tell your health care provider if you have gone into preterm labor before.  This information is not intended to replace advice given to you by your health care provider. Make sure you discuss any questions you have with your health care provider. Document Released: 06/15/2003 Document Revised: 09/05/2015 Document Reviewed: 08/16/2015 Elsevier Interactive Patient Education  2018 [employer].

## 2017-01-01 ENCOUNTER — Encounter: Payer: Self-pay | Admitting: "Endocrinology

## 2017-01-01 ENCOUNTER — Ambulatory Visit (INDEPENDENT_AMBULATORY_CARE_PROVIDER_SITE_OTHER): Payer: Medicare HMO | Admitting: "Endocrinology

## 2017-01-01 DIAGNOSIS — Z3A32 32 weeks gestation of pregnancy: Secondary | ICD-10-CM | POA: Diagnosis not present

## 2017-01-01 LAB — URINE CULTURE

## 2017-01-01 NOTE — Progress Notes (Signed)
HPI: Heather [name] is a 41 y.o.-year-old female.  She is returning for follow-up for obesity , she is pregnant at [redacted] weeks of gestation. - She is being followed up for high risk twin  pregnancy, complicated by morbid obesity. - She reports that she has been overweight/obese for most of her adult life. - After several years of unsuccessful attempt to conceive, earlier this year she was able to lose significant amount of weight (around 100 pounds), which helped her achieve pregnancy, currently at 32 weeks of gestation. - She reports pregnancy happening naturally. - Over the last several weeks she was observed to gain rapid body weight from as low as 398 pounds  prior to conception to her current weight of  458 pounds. She weighed 412 pounds at gestational week 49 of her pregnancy.   - She was sent for labs during her last visit, showing A1c of 5.1% and normal thyroid function. - Patient admits she did not make any changes in her dietary habit due to death in the family  prior to her last visit and due to a series of baby showers since her last visit. - She  continues to have significant dietary indiscretion, consuming large quantities of sweets including ice cream, cakes,  various forms of soda, Sweet tea, candies, chips, cookies, artificial sweeteners. She has gained 15 pounds since last visit. - She also admits that her portion sizes are large, and does not follow any particular meal pattern. She is following with a dietitian in [place], but reportedly has not been consistent with her follow-ups.   Lab Results  Component Value Date   HGBA1C 5.1 11/28/2016    - No h/o hypothyroidism. Last thyroid tests: Lab Results  Component Value Date   TSH 1.74 11/28/2016   FREET4 1.2 11/28/2016   - last set of lipids: Lab Results  Component Value Date   CHOL 142 06/30/2008   HDL 36 (L) 06/30/2008   LDLCALC 54 06/30/2008   TRIG 259 (H) 06/30/2008   CHOLHDL 3.9 Ratio 06/30/2008    I reviewed  her chart and she also has a history of Hypertension, asthma, obstructive sleep apnea, gastroesophageal reflux disease.  Patient has Family history of  of diabetes and one of her grandparents, mood disorder in mother, hypertension in her sister.   Past Medical History:  Diagnosis Date  . Amenorrhea   . Anxiety   . Anxiety   . Asthma   . COPD (chronic obstructive pulmonary disease) ([place])   . Depression   . Depression   . Diabetes mellitus without complication ([place])   . Dysmenorrhea   . Dysrhythmia    DR Johnsie Cancel    . Ectopic pregnancy 2013  . Eosinophilic esophagitis    Diagnosed at [facility] 06/16/2013, untreated  . GERD (gastroesophageal reflux disease)    HEARTBURN   TUMS  . Hard to intubate 11/07/2015  . Leukocytosis 07/28/2008   Qualifier: Diagnosis of  By: Jonna Munro MD, Roderic Scarce    . Morbid obesity ([place])   . Neuromuscular disorder ([facility])    RESTLESS LEG   . Obesity   . Schizoaffective disorder, bipolar type ([place])   . Sepsis ([place]) 11/11/2014  . Shortness of breath    WITH EXERTION   . Sleep apnea    CPAP   Past Surgical History:  Procedure Laterality Date  . CHOLECYSTECTOMY    . DENTAL SURGERY    . ESOPHAGOGASTRODUODENOSCOPY  May 2007   Dr. Gala Romney: Normal esophagus, stomach, D1, D2  .  ESOPHAGOGASTRODUODENOSCOPY  06/16/2013   Dr. Carlton Adam, eosinophilic esophagitis, reactive gastropathy, no esophageal dilation  . TONSILLECTOMY    . TOOTH EXTRACTION  10/28/2011   Procedure: DENTAL RESTORATION/EXTRACTIONS;  Surgeon: Gae Bon, DDS;  Location: [facility] OR;  Service: Oral Surgery;;   Social History   Social History  . Marital status: Married    Spouse name: N/A  . Number of children: 0  . Years of education: N/A   Occupational History  . volunteer    Social History Main Topics  . Smoking status: Former Smoker    Packs/day: 0.50    Years: 8.00    Types: Cigarettes    Quit date: 04/25/2011  . Smokeless tobacco: Never Used  . Alcohol use No  . Drug use: No  . Sexual  activity: Yes    Birth control/ protection: None     Comment: stopped smoking in jan.. had a pack the other day   Other Topics Concern  . None   Social History Narrative  . None   Outpatient Encounter Prescriptions as of 01/01/2017  Medication Sig  . acetaminophen (TYLENOL) 500 MG tablet Take 1,000 mg by mouth every 6 (six) hours as needed. pain  . albuterol (PROVENTIL HFA;VENTOLIN HFA) 108 (90 Base) MCG/ACT inhaler Inhale 2 puffs into the lungs every 4 (four) hours as needed for wheezing or shortness of breath.  Marland Kitchen buPROPion (WELLBUTRIN XL) 300 MG 24 hr tablet Take 300 mg by mouth every morning.   . busPIRone (BUSPAR) 10 MG tablet Take 15 mg by mouth 2 (two) times daily. For anxiety  . cetirizine (ZYRTEC) 10 MG tablet Take 1 tablet (10 mg total) by mouth 2 (two) times daily.  . Cholecalciferol (VITAMIN D3) 5000 units CAPS Take 10,000 Units by mouth daily.   . diphenhydrAMINE (BENADRYL) 25 MG tablet Take 25 mg by mouth once as needed for itching.   . montelukast (SINGULAIR) 10 MG tablet Take 1 tablet (10 mg total) by mouth at bedtime.  . nitrofurantoin, macrocrystal-monohydrate, (MACROBID) 100 MG capsule Take 1 capsule (100 mg total) by mouth 2 (two) times daily. X 7 days  . pantoprazole (PROTONIX) 40 MG tablet TAKE 1 TABLET BY MOUTH DAILY  . Prenatal Vit-Fe Fumarate-FA (PRENATAL MULTIVITAMIN) TABS Take 1 tablet by mouth daily.   Marland Kitchen triamcinolone ointment (KENALOG) 0.1 % Apply 1 application topically 2 (two) times daily. (Patient taking differently: Apply 1 application topically as needed. )  . vitamin E 400 UNIT capsule Take 400 Units by mouth daily.   Facility-Administered Encounter Medications as of 01/01/2017  Medication  . [COMPLETED] ipratropium (ATROVENT) nebulizer solution 0.5 mg   ALLERGIES: Allergies  Allergen Reactions  . Bee Venom Shortness Of Breath  . Penicillins Anaphylaxis    Has patient had a PCN reaction causing immediate rash, facial/tongue/throat swelling, SOB or  lightheadedness with hypotension: No Has patient had a PCN reaction causing severe rash involving mucus membranes or skin necrosis: No Has patient had a PCN reaction that required hospitalization No Has patient had a PCN reaction occurring within the last 10 years: No If all of the above answers are "NO", then may proceed with Cephalosporin use.'  REACTION: Angioedema  . Adhesive [Tape] Rash  . Latex Rash  . Vancomycin Other (See Comments)    Pt can tolerate Vancomycin but did cause Red-Man Syndrome.  Recommend to pre-medicate with Benadryl before doses administered.     VACCINATION STATUS: Immunization History  Administered Date(s) Administered  . H1N1 03/08/2008  . Influenza Whole 04/28/2006,  01/21/2008  . Influenza,inj,Quad PF,6+ Mos 02/20/2016, 12/20/2016  . Pneumococcal Polysaccharide-23 03/08/2008   ROS: Constitutional: +  weight gain, + fatigue, no subjective hyperthermia, no subjective hypothermia Eyes: no blurry vision, no xerophthalmia ENT: no sore throat, no nodules palpated in throat, no dysphagia/odynophagia, no hoarseness Cardiovascular: no Chest Pain, no Shortness of Breath, no palpitations, no leg swelling Respiratory: no cough, no SOB Gastrointestinal: no Nausea/Vomiting/Diarhhea Musculoskeletal: no muscle/joint aches Skin: no rashes Neurological: no tremors, no numbness, no tingling, no dizziness Psychiatric: no depression, no anxiety   PE: BP (!) 166/95   Pulse 97   Ht 5' 3.58" (1.615 m)   Wt (!) 458 lb (207.7 kg)   LMP 05/18/2016 (Exact Date)   BMI 79.66 kg/m  Wt Readings from Last 3 Encounters:  01/01/17 (!) 458 lb (207.7 kg)  12/30/16 (!) 453 lb 6.4 oz (205.7 kg)  12/20/16 (!) 440 lb 6.4 oz (199.8 kg)   Constitutional:  + Morbid obesity with early third trimester pregnancy at 28 weeks of gestation , not in acute distress, normal state of mind Eyes: PERRLA, EOMI, no exophthalmos ENT: + Edentulous, moist mucous membranes, no thyromegaly, no cervical  lymphadenopathy Cardiovascular: normal precordial activity, Regular Rate and Rhythm, no Murmur/Rubs/Gallops Respiratory:  adequate breathing efforts, no gross chest deformity, Clear to auscultation bilaterally Gastrointestinal: +abdomen is obese, gravid, soft, Non -tender,  Bowel Sounds present Musculoskeletal: + Large extremities,  no gross edema , strength intact in all four extremities Skin: moist, warm, no rashes Neurological: no tremor with outstretched hands, Deep tendon reflexes normal in all four extremities.  CMP     Component Value Date/Time   NA 140 10/25/2016 1026   K 4.4 10/25/2016 1026   CL 104 10/25/2016 1026   CO2 20 10/25/2016 1026   GLUCOSE 81 10/25/2016 1026   GLUCOSE 100 (H) 12/09/2014 0949   BUN 6 10/25/2016 1026   CREATININE 0.40 (L) 10/25/2016 1026   CREATININE 0.70 07/14/2014 1451   CALCIUM 9.3 10/25/2016 1026   PROT 6.4 10/25/2016 1026   ALBUMIN 3.6 10/25/2016 1026   AST 20 10/25/2016 1026   ALT 26 10/25/2016 1026   ALKPHOS 58 10/25/2016 1026   BILITOT 0.2 10/25/2016 1026   GFRNONAA 133 10/25/2016 1026   GFRAA 153 10/25/2016 1026   Results for Gullett, Briella L (MRN [id number]) as of 01/01/2017 13:01  Ref. Range 11/28/2016 14:39  Hemoglobin A1C Latest Ref Range: <5.7 % 5.1  TSH Latest Units: mIU/L 1.74  T4,Free(Direct) Latest Ref Range: 0.8 - 1.8 ng/dL 1.2    ASSESSMENT: 1. Morbid Obesity 2. High Risk Third trimester pregnancy-28 weeks with twins   PLAN: - Based on her reported and documented history,  she has been overweight/obese for most of her adult life. She was able to lose approximately 100 pounds , down to 398 pounds, prior to her successful conception, and since then she has progressively gained 55 pounds. - Suspicion for any endocrine cause a weight gain is low at this time and the likely etiology for rapid weight gain is still excessive calorie Intake. - She has normal thyroid function test, A1c is 5.1%. - Due to her concurrent pregnancy,  she is not a candidate for weight loss medications nor surgery. - At this time, the only viable choice left for her  to slow down on the weight gain still is caloric restriction.  I discussed diet in detail once again, reviewing each of her food choices and advised for healthy alternatives. -Suggestion is made for her to  avoid simple carbohydrates  from her diet including Cakes, Sweet Desserts, Ice Cream, Soda (diet and regular), Sweet Tea, doughnuts, pancakes, pizza, Candies, Chips, Cookies, Store Bought Juices, Alcohol, Artificial Sweeteners, and "Sugar-free" Products.  - I encouraged her to switch to  unprocessed or minimally processed complex starch and increased protein intake (animal or plant source), fruits, and vegetables.  - She will return in 6 weeks for revaluation. - I have advised her to stay off of metformin for now.  - Given her high risk for late pregnancy complications posed by her particular heavy weight,  I advised her to maintain [place] follow up with her OB/GYN provider Dr. Glo Herring.  -  Mrs. Porte may need Preterm delivery when appropriate to minimize the risks for preeclampsia, maternal, and perinatal complications.   Glade Lloyd, MD [facility]  This note was partially dictated with voice recognition software. Similar sounding words can be transcribed inadequately or may not  be corrected upon review.

## 2017-01-02 ENCOUNTER — Encounter: Payer: Self-pay | Admitting: Obstetrics and Gynecology

## 2017-01-02 ENCOUNTER — Other Ambulatory Visit: Payer: Self-pay | Admitting: Women's Health

## 2017-01-02 ENCOUNTER — Ambulatory Visit (INDEPENDENT_AMBULATORY_CARE_PROVIDER_SITE_OTHER): Payer: Medicare HMO | Admitting: Obstetrics and Gynecology

## 2017-01-02 VITALS — BP 140/80 | HR 100 | Wt >= 6400 oz

## 2017-01-02 DIAGNOSIS — Z331 Pregnant state, incidental: Secondary | ICD-10-CM

## 2017-01-02 DIAGNOSIS — O30049 Twin pregnancy, dichorionic/diamniotic, unspecified trimester: Secondary | ICD-10-CM

## 2017-01-02 DIAGNOSIS — O30043 Twin pregnancy, dichorionic/diamniotic, third trimester: Secondary | ICD-10-CM

## 2017-01-02 DIAGNOSIS — O09513 Supervision of elderly primigravida, third trimester: Secondary | ICD-10-CM

## 2017-01-02 DIAGNOSIS — O24113 Pre-existing diabetes mellitus, type 2, in pregnancy, third trimester: Secondary | ICD-10-CM | POA: Diagnosis not present

## 2017-01-02 DIAGNOSIS — F329 Major depressive disorder, single episode, unspecified: Secondary | ICD-10-CM

## 2017-01-02 DIAGNOSIS — O09523 Supervision of elderly multigravida, third trimester: Secondary | ICD-10-CM

## 2017-01-02 DIAGNOSIS — Z6841 Body Mass Index (BMI) 40.0 and over, adult: Secondary | ICD-10-CM

## 2017-01-02 DIAGNOSIS — O0993 Supervision of high risk pregnancy, unspecified, third trimester: Secondary | ICD-10-CM

## 2017-01-02 DIAGNOSIS — O0990 Supervision of high risk pregnancy, unspecified, unspecified trimester: Secondary | ICD-10-CM

## 2017-01-02 DIAGNOSIS — O403XX1 Polyhydramnios, third trimester, fetus 1: Secondary | ICD-10-CM | POA: Diagnosis not present

## 2017-01-02 DIAGNOSIS — O24119 Pre-existing diabetes mellitus, type 2, in pregnancy, unspecified trimester: Secondary | ICD-10-CM

## 2017-01-02 DIAGNOSIS — O2602 Excessive weight gain in pregnancy, second trimester: Secondary | ICD-10-CM

## 2017-01-02 DIAGNOSIS — Z1389 Encounter for screening for other disorder: Secondary | ICD-10-CM

## 2017-01-02 DIAGNOSIS — O2603 Excessive weight gain in pregnancy, third trimester: Secondary | ICD-10-CM | POA: Diagnosis not present

## 2017-01-02 DIAGNOSIS — O403XX2 Polyhydramnios, third trimester, fetus 2: Secondary | ICD-10-CM | POA: Diagnosis not present

## 2017-01-02 DIAGNOSIS — O288 Other abnormal findings on antenatal screening of mother: Secondary | ICD-10-CM

## 2017-01-02 DIAGNOSIS — O99343 Other mental disorders complicating pregnancy, third trimester: Secondary | ICD-10-CM

## 2017-01-02 DIAGNOSIS — Z3A32 32 weeks gestation of pregnancy: Secondary | ICD-10-CM | POA: Diagnosis not present

## 2017-01-02 LAB — POCT URINALYSIS DIPSTICK
Blood, UA: 3
Glucose, UA: NEGATIVE
KETONES UA: NEGATIVE
NITRITE UA: NEGATIVE
PROTEIN UA: 1

## 2017-01-02 NOTE — Progress Notes (Signed)
Patient ID: Heather [name], female   DOB: January 24, 1976, 41 y.o.   MRN: [id number]  High Risk Pregnancy Diagnosis(es): Class B DM, Di-Di Twin gestation, AMA, morbid obesity, depression, and now polyhydramnios x 2  G2P0010 [redacted]w[redacted]d Estimated Date of Delivery: 02/22/17  Blood pressure 140/80, pulse 100, weight (!) 458 lb 3.2 oz (207.8 kg), last menstrual period 05/18/2016.  Urinalysis: 3+ blood, 1+ protein, 2+ leukocytes, otherwise negative  HPI: Pt denies any complaints today.  BP weight and urine results all reviewed and noted. Patient reports good fetal movement, denies any bleeding and no rupture of membranes symptoms or regular contractions.  Fundal Height: not done Fetal Heart rate: per U/S Baby A: unattainable - flexion, extension, hiccups, breach Baby B: 145 with acceleration    Edema: trace  Patient is without complaints. All questions were answered.  Assessment:   1) High-risk pregnancy G2P0010 at [redacted]w[redacted]d with an Estimated Date of Delivery: 02/22/17  2) Class B DM, stable, off meds per Dr. Dorris Fetch 3) Multiple gestation, Di-Di Twins, stable, discordance 12% on today' u/s 4) AMA 5) Morbid obesity> 13lb weight gain since last visit> discussed decreasing carbs/calories, increasing exercise> no more weight gain 6) Depression> stable, continue wellbutrin, buspar 7) Polyhydramnios x 2> new dx, already in antenatal testing 8) Asymptomatic bacteruria> rx macrobid bid x 7d, send urine cx  Treatment Plan:  Growth u/s @ 35, 38wks    2x/wk testing nst alt w/ sono    Deliver @ 38wks    By signing my name below, I, Margit Banda, attest that this documentation has been prepared under the direction and in the presence of Jonnie Kind, MD. Electronically Signed: Margit Banda, Medical Scribe. 01/02/17. 12:16 PM.  I personally performed the services described in this documentation, which was SCRIBED in my presence. The recorded information has been reviewed and considered accurate. It has  been edited as necessary during review. Jonnie Kind, MD

## 2017-01-03 ENCOUNTER — Other Ambulatory Visit: Payer: Medicare HMO | Admitting: Obstetrics and Gynecology

## 2017-01-06 ENCOUNTER — Telehealth: Payer: Self-pay | Admitting: Obstetrics and Gynecology

## 2017-01-06 ENCOUNTER — Ambulatory Visit (INDEPENDENT_AMBULATORY_CARE_PROVIDER_SITE_OTHER): Payer: Medicare HMO | Admitting: Obstetrics and Gynecology

## 2017-01-06 ENCOUNTER — Ambulatory Visit (INDEPENDENT_AMBULATORY_CARE_PROVIDER_SITE_OTHER): Payer: Medicare HMO

## 2017-01-06 ENCOUNTER — Encounter: Payer: Self-pay | Admitting: Obstetrics and Gynecology

## 2017-01-06 VITALS — BP 130/70 | HR 98 | Wt >= 6400 oz

## 2017-01-06 DIAGNOSIS — O30043 Twin pregnancy, dichorionic/diamniotic, third trimester: Secondary | ICD-10-CM | POA: Diagnosis not present

## 2017-01-06 DIAGNOSIS — O30049 Twin pregnancy, dichorionic/diamniotic, unspecified trimester: Secondary | ICD-10-CM

## 2017-01-06 DIAGNOSIS — Z3A33 33 weeks gestation of pregnancy: Secondary | ICD-10-CM | POA: Diagnosis not present

## 2017-01-06 DIAGNOSIS — O0990 Supervision of high risk pregnancy, unspecified, unspecified trimester: Secondary | ICD-10-CM

## 2017-01-06 DIAGNOSIS — Z6841 Body Mass Index (BMI) 40.0 and over, adult: Secondary | ICD-10-CM

## 2017-01-06 DIAGNOSIS — O403XX1 Polyhydramnios, third trimester, fetus 1: Secondary | ICD-10-CM

## 2017-01-06 DIAGNOSIS — O403XX2 Polyhydramnios, third trimester, fetus 2: Secondary | ICD-10-CM

## 2017-01-06 DIAGNOSIS — O09523 Supervision of elderly multigravida, third trimester: Secondary | ICD-10-CM | POA: Diagnosis not present

## 2017-01-06 DIAGNOSIS — O24119 Pre-existing diabetes mellitus, type 2, in pregnancy, unspecified trimester: Secondary | ICD-10-CM

## 2017-01-06 DIAGNOSIS — O2603 Excessive weight gain in pregnancy, third trimester: Secondary | ICD-10-CM

## 2017-01-06 DIAGNOSIS — O24113 Pre-existing diabetes mellitus, type 2, in pregnancy, third trimester: Secondary | ICD-10-CM

## 2017-01-06 DIAGNOSIS — Z331 Pregnant state, incidental: Secondary | ICD-10-CM

## 2017-01-06 DIAGNOSIS — O09513 Supervision of elderly primigravida, third trimester: Secondary | ICD-10-CM

## 2017-01-06 DIAGNOSIS — O0993 Supervision of high risk pregnancy, unspecified, third trimester: Secondary | ICD-10-CM

## 2017-01-06 DIAGNOSIS — Z1389 Encounter for screening for other disorder: Secondary | ICD-10-CM

## 2017-01-06 LAB — POCT URINALYSIS DIPSTICK
GLUCOSE UA: NEGATIVE
KETONES UA: NEGATIVE
Nitrite, UA: NEGATIVE

## 2017-01-06 NOTE — Progress Notes (Signed)
Patient ID: Heather [name], female   DOB: 08-Sep-1975, 41 y.o.   MRN: [id number] Fetal Surveillance Testing today:  None   High Risk Pregnancy Diagnosis(es):   high-risk pregnancy complicated by Class B DM, Di-Di Twin gestation, AMA, morbid obesity, depression, and now polyhydramnios x 2  G2P0010 [redacted]w[redacted]d Estimated Date of Delivery: 02/22/17  Blood pressure 130/70, pulse 98, weight (!) 464 lb 3.2 oz (210.6 kg), last menstrual period 05/18/2016.  Urinalysis: 3+ blood, trace protein, 3+ leukocytes, otherwise negativebeing treated for UTI with Macrobid   HPI: The patient is being seen today for ongoing management of high-risk pregnancy complicated by Class B DM, Di-Di Twin gestation, AMA, morbid obesity, depression, and now polyhydramnios x 2. Today she reports additional weight gain, she requests fluid pill. She denies shortness of breath but is limited in her ability to move due to weight gain and on requiring massive obesity. Currently weight gain is 68 pounds this pregnancy.   BP weight and urine results all reviewed and noted. Patient reports good fetal movement, denies any bleeding and no rupture of membranes symptoms or regular contractions.  Fundal Height:   Fetal Heart rate:  See BPP Edema:  4 +    Patient is without complaints other than noted in her HPI. All questions were answered.  All lab and sonogram results have been reviewed. Comments:  CBG log shows optimal blood sugars, we'll check hemoglobin A1c as I doubt credibility of this report  Assessment:   1.  High-risk Pregnancy at [redacted]w[redacted]d,  Estimated Date of Delivery: 02/22/17 :  2. Class A1B DM, stable, off meds per Dr. Dorris Fetch with optimal blood sugarson report, doubtful credibility 3. Multiple gestation, Di-Di Twins, stable, discordance 12% on today' u/s 4. AMA 5. Morbid obesity> 13lb weight gain since last visit> discussed decreasing carbs/calories, increasing exercise> no more weight gain 6. Depression> stable, continue  wellbutrin, buspar 7. Polyhydramnios x 2> new dx, already in antenatal testing 8. Asymptomatic bacteruria> rx macrobid bid x 7d, send urine cx   Treatment Plan:  1. Growth u/s @ 35, 38wks    2. 2x/wk testing nst alt w/ sono     3. Deliver @ 38wks if not before 4. Pt is to track her glucose in a journal 5. Hemoglobin A1c upon return visit  Will ask patient to bring her machine to cross check it against our office glucometer  No Follow-up on file. for appointment for high risk OB care  No orders of the defined types were placed in this encounter.  Orders Placed This Encounter  Procedures  . POCT urinalysis dipstick   By signing my name below, I, Margit Banda, attest that this documentation has been prepared under the direction and in the presence of Jonnie Kind, MD. Electronically Signed: Margit Banda, Medical Scribe. 01/06/17. 12:16 PM.  I personally performed the services described in this documentation, which was SCRIBED in my presence. The recorded information has been reviewed and considered accurate. It has been edited as necessary during review. Jonnie Kind, MD

## 2017-01-06 NOTE — Progress Notes (Addendum)
[place] 33+2 wks,DI/DI twins  BABY A: cephalic left,ant pl gr 2,BPP 8/8,SVP of fluid 10 cm,fhr 148 bpm BABY B: trans head left,ant pl gr 2,BPP 8/8,SVP of fluid 12 cm,fhr 148 bpm

## 2017-01-06 NOTE — Telephone Encounter (Signed)
Phone call to patient who is advised to bring her glucometer to her next [facility] appointment

## 2017-01-07 ENCOUNTER — Other Ambulatory Visit: Payer: Medicare HMO

## 2017-01-07 ENCOUNTER — Encounter: Payer: Medicare HMO | Admitting: Advanced Practice Midwife

## 2017-01-07 ENCOUNTER — Encounter: Payer: Self-pay | Admitting: Allergy & Immunology

## 2017-01-07 ENCOUNTER — Ambulatory Visit (INDEPENDENT_AMBULATORY_CARE_PROVIDER_SITE_OTHER): Payer: Medicare HMO | Admitting: Allergy & Immunology

## 2017-01-07 VITALS — BP 120/82 | HR 102 | Resp 19

## 2017-01-07 DIAGNOSIS — J45909 Unspecified asthma, uncomplicated: Secondary | ICD-10-CM | POA: Diagnosis not present

## 2017-01-07 DIAGNOSIS — O2686 Pruritic urticarial papules and plaques of pregnancy (PUPPP): Secondary | ICD-10-CM

## 2017-01-07 DIAGNOSIS — J4530 Mild persistent asthma, uncomplicated: Secondary | ICD-10-CM | POA: Insufficient documentation

## 2017-01-07 DIAGNOSIS — J3089 Other allergic rhinitis: Secondary | ICD-10-CM

## 2017-01-07 DIAGNOSIS — O99513 Diseases of the respiratory system complicating pregnancy, third trimester: Secondary | ICD-10-CM

## 2017-01-07 MED ORDER — FLUTICASONE PROPIONATE HFA 110 MCG/ACT IN AERO: 2.0000 | INHALATION_SPRAY | Freq: Two times a day (BID) | RESPIRATORY_TRACT | 5 refills | Status: DC

## 2017-01-07 NOTE — Patient Instructions (Addendum)
1. Mild persistent asthma - complicated by pregnancy (~34 weeks) - Testing actually looked improved today, but since you are having increased wheezing and shortness of breath, I think we should re-start the Flovent. - Daily controller medication(s): Flovent 175mcg two puffs twice daily + Singulair 10mg  daily - Rescue medications: ProAir 4 puffs every 4-6 hours as needed - Changes during respiratory infections or worsening symptoms: increase Flovent 141mcg to 4 puffs twice daily for TWO WEEKS. - Asthma control goals:  * Full participation in all desired activities (may need albuterol before activity) * Albuterol use two time or less a week on average (not counting use with activity) * Cough interfering with sleep two time or less a month * Oral steroids no more than once a year * No hospitalizations   2. Perennial allergic rhinitis - Continue with nasal saline rinses 1-2 times daily. - Hopefully the addition of the Zyrtec (cetirizine) will help.  3. Pruritic urticarial papules and plaques of pregnancy (PUPPP) - Continue with Zyrtec (cetirizine) 10mg  twice daily. - Continue with triamcinolone ointment twice daily as needed to the worst areas.  - This is not a dangerous condition for the girls, but it is quite irritating.  4. Return in about 4 weeks (around 02/04/2017).   Please inform us of any Emergency Department visits, hospitalizations, or changes in symptoms. Call us before going to the ED for breathing or allergy symptoms since we might be able to fit you in for a sick visit. Feel free to contact us anytime with any questions, problems, or concerns.  It was a pleasure to see you again today! Good luck with the pregnancy!   Websites that have reliable patient information: 1. American Academy of Asthma, Allergy, and Immunology: www.aaaai.org 2. Food Allergy Research and Education (FARE): foodallergy.org 3. Mothers of Asthmatics: http://www.asthmacommunitynetwork.org 4. American  College of Allergy, Asthma, and Immunology: www.acaai.org   Election Day is coming up on Tuesday, November 6th! Make your voice heard! Register to vote at vote.org!

## 2017-01-07 NOTE — Progress Notes (Signed)
FOLLOW UP  Date of Service/Encounter:  01/07/17   Assessment:   Mild persistent asthma without complication  Perennial allergic rhinitis  PUPP (pruritic urticarial papules and plaques of pregnancy)  Asthma complicating pregnancy in third trimester   Plan/Recommendations:   1. Mild persistent asthma - complicated by pregnancy (~34 weeks) - Testing actually looked improved today, but since you are having increased wheezing and shortness of breath, I think we should re-start the Flovent. - Daily controller medication(s): Flovent 149mcg two puffs twice daily + Singulair 10mg  daily - Rescue medications: ProAir 4 puffs every 4-6 hours as needed - Changes during respiratory infections or worsening symptoms: increase Flovent 155mcg to 4 puffs twice daily for TWO WEEKS. - Asthma control goals:  * Full participation in all desired activities (may need albuterol before activity) * Albuterol use two time or less a week on average (not counting use with activity) * Cough interfering with sleep two time or less a month * Oral steroids no more than once a year * No hospitalizations   2. Perennial allergic rhinitis - Continue with nasal saline rinses 1-2 times daily. - Hopefully the addition of the Zyrtec (cetirizine) will help.  3. Pruritic urticarial papules and plaques of pregnancy (PUPPP) - Continue with Zyrtec (cetirizine) 10mg  twice daily. - Continue with triamcinolone ointment twice daily as needed to the worst areas.  - This is not a dangerous condition for the girls, but it is quite irritating.  4. Return in about 4 weeks (around 02/04/2017).    Subjective:   Heather [name] is a 41 y.o. female presenting today for follow up of  Chief Complaint  Patient presents with  . Asthma    Not using Pulmicort. Primarily using albuterol when needed.  . Allergies    Relief with Zrytec     Heather [name] has a history of the following: Patient Active Problem List   Diagnosis  Date Noted  . Mild persistent asthma without complication 19/14/7829  . [redacted] weeks gestation of pregnancy 01/01/2017  . Polyhydramnios affecting pregnancy 12/31/2016  . Asymptomatic bacteriuria during pregnancy in third trimester 12/31/2016  . BMI 70 and over, adult ([place]) 12/15/2016  . PUPP (pruritic urticarial papules and plaques of pregnancy) 11/05/2016  . Perennial allergic rhinitis 11/05/2016  . Mild persistent asthma with acute exacerbation 11/05/2016  . Excess weight gain in pregnancy, second trimester 10/30/2016  . Diabetes mellitus complicating pregnancy, antepartum 09/05/2016  . Supervision of high risk pregnancy, antepartum 08/06/2016  . Dichorionic diamniotic twin pregnancy, antepartum 08/06/2016  . AMA (advanced maternal age) multigravida 35+ 07/19/2016  . Anxiety and depression 11/07/2015  . Hard to intubate 11/07/2015  . Hypoglycemia 11/07/2015  . OSA (obstructive sleep apnea) 11/07/2015  . DM type 2 (diabetes mellitus, type 2) ([place]) 12/07/2014  . Panniculitis 12/06/2014  . Cellulitis, abdominal wall 11/11/2014  . Abdominal pain 07/14/2014  . Loose stools 07/14/2014  . Melena 01/27/2014  . Eosinophilic esophagitis 56/21/3086  . Bowel habit changes 04/28/2013  . Esophageal dysphagia 04/28/2013  . Insomnia due to mental disorder(327.02) 08/08/2011  . RLS (restless legs syndrome) 08/08/2011  . Adjustment disorder with depressed mood 08/06/2011  . Schizoaffective disorder, bipolar type ([place]) 08/02/2011    Class: Acute  . PALPITATIONS, OCCASIONAL 11/01/2009  . Bipolar disorder ([place]) 10/19/2008  . DISORDER, TOBACCO USE 08/25/2008  . Leukocytosis 07/28/2008  . ALLERGIC RHINITIS, SEASONAL 08/20/2007  . DYSMETABOLIC SYNDROME 57/84/6962  . Morbid obesity ([place]) 05/13/2006  . EXTERNAL HEMORRHOIDS 05/13/2006  . HYPERLIPIDEMIA 05/12/2006  .  Essential hypertension 05/12/2006  . ASTHMA 05/12/2006  . GERD (gastroesophageal reflux disease) 05/12/2006  . OSTEOARTHRITIS 05/12/2006     History obtained from: chart review and patient.  Heather [name]'s Primary Care Provider is Heather Lei, MD.     Heather [name] is a 41 y.o. female presenting for a follow up visit. She was last seen in July 2018. At that time, she was doing fairly well. We continued her on Pulmicort 142mcg two puffs twice daily as well as albuterol as needed. She continued on nasal saline rinses as needed. She was complaining of a rash, which I diagnosed as PUPPPs. I recommended antihistamines and prescribed triamcinolone to use as needed.   Since the last visit, she has done well. She does not feel that the Pulmicort gets "deep enough" into her lungs. She is using albuterol only as needed. She estimates that she uses her albuterol once per week. She feels that she is having more shortness of breath as of late. She has been wheezing more lately, but denies night time coughing. She has not required an ED visits or UC visits. ACT score is 19, indicating stable asthma control.   Allergic rhinitis is well controlled with nasal saline and cetirizine as needed. She continues to have problems with the rash, but is using the triamcinolone as needed. The rash has now spread to her arms as well. Overall it is stable, however.   Her pregnancy has overall gone well. She is now almost [redacted] weeks pregnant. She is going to be having girls Glenard Haring and Carrizo Hill), and she has ultrasound pictures to show me today. She is still needing to set up bassinets, but she does have her car seats installed. Her husband is somewhat nervous about becoming a father.   Otherwise, there have been no changes to her past medical history, surgical history, family history, or social history.    Review of Systems: a 14-point review of systems is pertinent for what is mentioned in HPI.  Otherwise, all other systems were negative. Constitutional: negative other than that listed in the HPI Eyes: negative other than that listed in the HPI Ears, nose, mouth,  throat, and face: negative other than that listed in the HPI Respiratory: negative other than that listed in the HPI Cardiovascular: negative other than that listed in the HPI Gastrointestinal: negative other than that listed in the HPI Genitourinary: negative other than that listed in the HPI Integument: negative other than that listed in the HPI Hematologic: negative other than that listed in the HPI Musculoskeletal: negative other than that listed in the HPI Neurological: negative other than that listed in the HPI Allergy/Immunologic: negative other than that listed in the HPI    Objective:   Blood pressure 120/82, pulse (!) 102, resp. rate 19, last menstrual period 05/18/2016, SpO2 95 %. There is no height or weight on file to calculate BMI.   Physical Exam:  General: Alert, interactive, in no acute distress. Obese female. Very pleasant.  Eyes: No conjunctival injection present on the right, No conjunctival injection present on the left, PERRL bilaterally, No discharge on the right, No discharge on the left and No Horner-Trantas dots present Ears: Left TM pearly gray with normal light reflex, Left TM intact without perforation and Right TM unable to be visualized due to cerumen impaction.  Nose/Throat: External nose within normal limits and septum midline, turbinates minimally edematous with clear discharge, post-pharynx erythematous without cobblestoning in the posterior oropharynx. Tonsils 2+ without exudates Neck: Supple without thyromegaly.  Lungs: Clear to auscultation without wheezing, rhonchi or rales. No increased work of breathing. CV: Normal S1/S2, no murmurs. Capillary refill <2 seconds.  Skin: Warm and dry, without lesions or rashes. Neuro:   Grossly intact. No focal deficits appreciated. Responsive to questions.   Diagnostic studies:   Spirometry: results normal (FEV1: 2.64/80%, FVC: 2.07/73%, FEV1/FVC: 78%).    Spirometry consistent with normal pattern.  Allergy  Studies: none     Salvatore Marvel, MD [facility]

## 2017-01-09 ENCOUNTER — Ambulatory Visit (INDEPENDENT_AMBULATORY_CARE_PROVIDER_SITE_OTHER): Payer: Medicare HMO | Admitting: Obstetrics and Gynecology

## 2017-01-09 ENCOUNTER — Encounter: Payer: Self-pay | Admitting: Obstetrics and Gynecology

## 2017-01-09 VITALS — BP 134/86 | HR 101 | Wt >= 6400 oz

## 2017-01-09 DIAGNOSIS — O09513 Supervision of elderly primigravida, third trimester: Secondary | ICD-10-CM

## 2017-01-09 DIAGNOSIS — Z3A33 33 weeks gestation of pregnancy: Secondary | ICD-10-CM

## 2017-01-09 DIAGNOSIS — Z331 Pregnant state, incidental: Secondary | ICD-10-CM

## 2017-01-09 DIAGNOSIS — O30043 Twin pregnancy, dichorionic/diamniotic, third trimester: Secondary | ICD-10-CM

## 2017-01-09 DIAGNOSIS — O2602 Excessive weight gain in pregnancy, second trimester: Secondary | ICD-10-CM

## 2017-01-09 DIAGNOSIS — O24113 Pre-existing diabetes mellitus, type 2, in pregnancy, third trimester: Secondary | ICD-10-CM

## 2017-01-09 DIAGNOSIS — O2603 Excessive weight gain in pregnancy, third trimester: Secondary | ICD-10-CM

## 2017-01-09 DIAGNOSIS — O403XX1 Polyhydramnios, third trimester, fetus 1: Secondary | ICD-10-CM

## 2017-01-09 DIAGNOSIS — O0993 Supervision of high risk pregnancy, unspecified, third trimester: Secondary | ICD-10-CM

## 2017-01-09 DIAGNOSIS — Z1389 Encounter for screening for other disorder: Secondary | ICD-10-CM

## 2017-01-09 DIAGNOSIS — O403XX2 Polyhydramnios, third trimester, fetus 2: Secondary | ICD-10-CM

## 2017-01-09 LAB — POCT URINALYSIS DIPSTICK
GLUCOSE UA: NEGATIVE
Ketones, UA: NEGATIVE
NITRITE UA: NEGATIVE

## 2017-01-09 NOTE — Progress Notes (Addendum)
Heather [name] is a 41 y.o. female High Risk Pregnancy HROB Diagnosis(es): high-risk pregnancy complicatedbyClass B DM, Di-Di Twin gestation, AMA, morbid obesity, depression, and now polyhydramnios x 2 Patient was to bring Glucometer,  G2P0010 [redacted]w[redacted]d Estimated Date of Delivery: 02/22/17    HPI: The patient is being seen today for ongoing management of as above. Today she reports no complaints. Pt had an U/S on Monday 01/06/17. She has been to the childbirth classes and taken a tour of the facility.  Patient reports good fetal movement, denies any bleeding and no rupture of membranes symptoms or regular contractions. [name] has completed childbirth classes  BP weight and urine results reviewed and noted. Blood pressure 134/86, pulse (!) 101, weight (!) 464 lb (210.5 kg), last menstrual period 05/18/2016. Doesn't have glucometer. Fundal Height:  Not done Fetal Heart rate:  Baby 1: 165, Baby 2: 168 Physical Examination: Abdomen - soft, nontender, nondistended, no masses or organomegaly Baby 1 vertex ,baby 2 transverse                                     Pelvic - not indicated                                     Edema:  4+ pannus, and lower extremity.  Urinalysis: large blood, 1+ protein, 2+ leukocytes urine has never been uncontaminated the whole pregnancy  Fetal Surveillance Testing today: FHR 132m 168 with both fetuses super active with flexion and extension normal fluid volume and breathing noted, essentially a bedside BPP by me  Lab and sonogram results have been reviewed.   Assessment:   1.  High-risk Pregnancy at [redacted]w[redacted]d,  Estimated Date of Delivery: 02/22/17 :  2.Class A1B DM, stable, off meds per Dr. Dorris Fetch with optimal blood sugarson report, doubtful credibility, did not bring CBG log R glucometer 3.Multiple gestation, Di-Di Twins,  4. AMA 5. Morbid obesity 6. Depression> stable, continue wellbutrin, buspar 7. Polyhydramnios x 2> new dx, already in antenatal  testing  Medication(s) Plans:  none  Treatment Plan: 1. Growth u/s @ 35, 38wks    2. 2x/wk testing nst alt w/ sono  3. Deliver @ 37-38wks if not before 4. Pt is to track her glucose in a journal did not bring glucometer 5. Hemoglobin A1c upon return visit  Consider weekly visits for BPP.  Follow up in 4 days (01/13/17) for appointment for high risk OB care, U/S    By signing my name below, I, Izna Ahmed, attest that this documentation has been prepared under the direction and in the presence of Jonnie Kind, MD. Electronically Signed: Jabier Gauss, Medical Scribe. 01/09/17. 9:54 AM.  I personally performed the services described in this documentation, which was SCRIBED in my presence. The recorded information has been reviewed and considered accurate. It has been edited as necessary during review. Jonnie Kind, MD

## 2017-01-10 ENCOUNTER — Other Ambulatory Visit: Payer: Medicare HMO | Admitting: Obstetrics and Gynecology

## 2017-01-13 ENCOUNTER — Encounter: Payer: Self-pay | Admitting: Women's Health

## 2017-01-13 ENCOUNTER — Ambulatory Visit (INDEPENDENT_AMBULATORY_CARE_PROVIDER_SITE_OTHER): Payer: Medicare HMO | Admitting: Women's Health

## 2017-01-13 ENCOUNTER — Ambulatory Visit (INDEPENDENT_AMBULATORY_CARE_PROVIDER_SITE_OTHER): Payer: Medicare HMO

## 2017-01-13 VITALS — BP 120/60 | HR 80 | Wt >= 6400 oz

## 2017-01-13 DIAGNOSIS — Z3A34 34 weeks gestation of pregnancy: Secondary | ICD-10-CM | POA: Diagnosis not present

## 2017-01-13 DIAGNOSIS — O9989 Other specified diseases and conditions complicating pregnancy, childbirth and the puerperium: Secondary | ICD-10-CM

## 2017-01-13 DIAGNOSIS — O0990 Supervision of high risk pregnancy, unspecified, unspecified trimester: Secondary | ICD-10-CM

## 2017-01-13 DIAGNOSIS — O09523 Supervision of elderly multigravida, third trimester: Secondary | ICD-10-CM | POA: Diagnosis not present

## 2017-01-13 DIAGNOSIS — O30049 Twin pregnancy, dichorionic/diamniotic, unspecified trimester: Secondary | ICD-10-CM | POA: Diagnosis not present

## 2017-01-13 DIAGNOSIS — Z331 Pregnant state, incidental: Secondary | ICD-10-CM | POA: Diagnosis not present

## 2017-01-13 DIAGNOSIS — O288 Other abnormal findings on antenatal screening of mother: Secondary | ICD-10-CM

## 2017-01-13 DIAGNOSIS — O30043 Twin pregnancy, dichorionic/diamniotic, third trimester: Secondary | ICD-10-CM

## 2017-01-13 DIAGNOSIS — O24119 Pre-existing diabetes mellitus, type 2, in pregnancy, unspecified trimester: Secondary | ICD-10-CM | POA: Diagnosis not present

## 2017-01-13 DIAGNOSIS — O09513 Supervision of elderly primigravida, third trimester: Secondary | ICD-10-CM

## 2017-01-13 DIAGNOSIS — O0993 Supervision of high risk pregnancy, unspecified, third trimester: Secondary | ICD-10-CM

## 2017-01-13 DIAGNOSIS — O24113 Pre-existing diabetes mellitus, type 2, in pregnancy, third trimester: Secondary | ICD-10-CM

## 2017-01-13 DIAGNOSIS — R82998 Other abnormal findings in urine: Secondary | ICD-10-CM | POA: Diagnosis not present

## 2017-01-13 DIAGNOSIS — R8271 Bacteriuria: Secondary | ICD-10-CM

## 2017-01-13 DIAGNOSIS — O99891 Other specified diseases and conditions complicating pregnancy: Secondary | ICD-10-CM

## 2017-01-13 DIAGNOSIS — O409XX0 Polyhydramnios, unspecified trimester, not applicable or unspecified: Secondary | ICD-10-CM

## 2017-01-13 DIAGNOSIS — O2603 Excessive weight gain in pregnancy, third trimester: Secondary | ICD-10-CM

## 2017-01-13 DIAGNOSIS — Z1389 Encounter for screening for other disorder: Secondary | ICD-10-CM

## 2017-01-13 DIAGNOSIS — Z6841 Body Mass Index (BMI) 40.0 and over, adult: Secondary | ICD-10-CM

## 2017-01-13 DIAGNOSIS — O409XX1 Polyhydramnios, unspecified trimester, fetus 1: Secondary | ICD-10-CM

## 2017-01-13 DIAGNOSIS — O409XX2 Polyhydramnios, unspecified trimester, fetus 2: Secondary | ICD-10-CM

## 2017-01-13 LAB — POCT URINALYSIS DIPSTICK
Blood, UA: 3
GLUCOSE UA: NEGATIVE
Ketones, UA: NEGATIVE
PROTEIN UA: 1

## 2017-01-13 LAB — GLUCOSE, POCT (MANUAL RESULT ENTRY): POC GLUCOSE: 89 mg/dL (ref 70–99)

## 2017-01-13 NOTE — Addendum Note (Signed)
Addended by: Diona Fanti A on: 01/13/2017 12:00 PM   Modules accepted: Orders

## 2017-01-13 NOTE — Progress Notes (Signed)
[place] 34+2 wks,DI/DI twins,anterior pl gr 3 BABY A: trans head left,BPP 8/8,fhr 133 bpm,svp of fluid 9.3 cm BABY B: trans head right,BPP 8/8,FHR 146 bpm,svp of fluid 9.4 cm

## 2017-01-13 NOTE — Patient Instructions (Signed)
Call the office [phone]) or go to [facility] if:  You begin to have strong, frequent contractions  Your water breaks.  Sometimes it is a big gush of fluid, sometimes it is just a trickle that keeps getting your panties wet or running down your legs  You have vaginal bleeding.  It is normal to have a small amount of spotting if your cervix was checked.   You don't feel your baby moving like normal.  If you don't, get you something to eat and drink and lay down and focus on feeling your baby move.  You should feel at least 10 movements in 2 hours.  If you don't, you should call the office or go to [place] and Birth Information The normal length of a pregnancy is 39-41 weeks. Preterm labor is when labor starts before 37 completed weeks of pregnancy. What are the risk factors for preterm labor? Preterm labor is more likely to occur in women who:  Have certain infections during pregnancy such as a bladder infection, sexually transmitted infection, or infection inside the uterus (chorioamnionitis).  Have a shorter-than-normal cervix.  Have gone into preterm labor before.  Have had surgery on their cervix.  Are younger than age 39 or older than age 54.  Are African American.  Are pregnant with twins or multiple babies (multiple gestation).  Take street drugs or smoke while pregnant.  Do not gain enough weight while pregnant.  Became pregnant shortly after having been pregnant.  What are the symptoms of preterm labor? Symptoms of preterm labor include:  Cramps similar to those that can happen during a menstrual period. The cramps may happen with diarrhea.  Pain in the abdomen or lower back.  Regular uterine contractions that may feel like tightening of the abdomen.  A feeling of increased pressure in the pelvis.  Increased watery or bloody mucus discharge from the vagina.  Water breaking (ruptured amniotic sac).  Why is it important to  recognize signs of preterm labor? It is important to recognize signs of preterm labor because babies who are born prematurely may not be fully developed. This can put them at an increased risk for:  Long-term (chronic) heart and lung problems.  Difficulty immediately after birth with regulating body systems, including blood sugar, body temperature, heart rate, and breathing rate.  Bleeding in the brain.  Cerebral palsy.  Learning difficulties.  Death.  These risks are highest for babies who are born before 64 weeks of pregnancy. How is preterm labor treated? Treatment depends on the length of your pregnancy, your condition, and the health of your baby. It may involve:  Having a stitch (suture) placed in your cervix to prevent your cervix from opening too early (cerclage).  Taking or being given medicines, such as: ? Hormone medicines. These may be given early in pregnancy to help support the pregnancy. ? Medicine to stop contractions. ? Medicines to help mature the baby's lungs. These may be prescribed if the risk of delivery is high. ? Medicines to prevent your baby from developing cerebral palsy.  If the labor happens before 34 weeks of pregnancy, you may need to stay in the hospital. What should I do if I think I am in preterm labor? If you think that you are going into preterm labor, call your health care provider right away. How can I prevent preterm labor in future pregnancies? To increase your chance of having a full-term pregnancy:  Do not use  any tobacco products, such as cigarettes, chewing tobacco, and e-cigarettes. If you need help quitting, ask your health care provider.  Do not use street drugs or medicines that have not been prescribed to you during your pregnancy.  Talk with your health care provider before taking any herbal supplements, even if you have been taking them regularly.  Make sure you gain a healthy amount of weight during your pregnancy.  Watch  for infection. If you think that you might have an infection, get it checked right away.  Make sure to tell your health care provider if you have gone into preterm labor before.  This information is not intended to replace advice given to you by your health care provider. Make sure you discuss any questions you have with your health care provider. Document Released: 06/15/2003 Document Revised: 09/05/2015 Document Reviewed: 08/16/2015 Elsevier Interactive Patient Education  2018 [employer].

## 2017-01-13 NOTE — Progress Notes (Signed)
    [facility] High-Risk Pregnancy Visit  Patient name: Heather [name] MRN [id number]  Date of birth: [birth year] CC & HPI:  Heather [name] is a 41 y.o. G50P0010 female at [redacted]w[redacted]d with an Estimated Date of Delivery: 02/22/17 being seen today for ongoing management of a high-risk pregnancy complicated by Di-Di Twin gestation, Class B DM, AMA, morbid obesity w/ excessive pregnancy weight gain, polyhydramnios x 2, depression.  Today she reports all FBS <90 except one (92), all 2hr pp normal. Some pressure when walking. Out of work now. Brought meter so we can correlate w/ ours to make sure hers is accurate Review of Systems:   Reports good fetal movement x 2 Denies regular contractions, leakage of fluid, vaginal bleeding, abnormal vaginal discharge w/ itching/odor/irritation, headaches, visual changes, shortness of breath, chest pain, abdominal pain, severe nausea/vomiting, or problems with urination or bowel movements unless otherwise stated above.  Pertinent History Reviewed:  Reviewed past medical,surgical, social and family history.  Reviewed problem list, medications and allergies. Objective Findings:   Vitals:   01/13/17 1051  BP: 120/60  Pulse: 80  Weight: (!) 466 lb (211.4 kg)    Body mass index is 81.05 kg/m.  General appearance: Well appearing, and in no distress Mental status: Alert, oriented to person, place, and time Skin: Warm & dry Cardiovascular: Normal heart rate noted Respiratory: Normal respiratory effort, no distress Abdomen: Soft, gravid Fundal Height:  Unable to discern Fetal Heart rate:  133/146 u/s SVE: n/a Edema:  1+  Fetal Surveillance Testing today:  [place] 34+2 wks,DI/DI twins,anterior pl gr 3 BABY A: trans head left,BPP 8/8,fhr 133 bpm,svp of fluid 9.3 cm BABY B: trans head right,BPP 8/8,FHR 146 bpm,svp of fluid 9.4 cm  Results for orders placed or performed in visit on 01/13/17 (from the past 24 hour(s))  POCT urinalysis dipstick   Collection Time:  01/13/17 10:52 AM  Result Value Ref Range   Color, UA     Clarity, UA     Glucose, UA neg    Bilirubin, UA     Ketones, UA neg    Spec Grav, UA  1.010 - 1.025   Blood, UA 3    pH, UA  5.0 - 8.0   Protein, UA 1    Urobilinogen, UA  0.2 or 1.0 E.U./dL   Nitrite, UA nwg    Leukocytes, UA Moderate (2+) (A) Negative   CBG w/ our meter: 89 Her meter: 82  Assessment & Plan:  1) High-risk pregnancy G2P0010 at [redacted]w[redacted]d with an Estimated Date of Delivery: 02/22/17  2) Di-Di Twins, stable, last growth u/s @ 32wks concordant @ 12% 3) Class B DM-no meds, sugars stable, will check A1C today 4) Morbid obesity w/ excessive pregnancy weight gain of 59lb to date, has had appt w/ endocrinology, recommended no further weight gain 5) Polyhydramnios x 2, stable, SVP 9.3/9.4 today, was 9.2/10.9 on 9/24 6) Depression, stable, continue wellbutrin  A1C, urine cx today  Treatment Plan:  Growth u/s @ 35, 38wks     2x/wk testing     Deliver @ 38wks or earlier if clinically indicated Reviewed: ptl s/s, fkc All questions were answered  Return for As scheduled Thurs for HROB/NST.   Orders Placed This Encounter  Procedures  . Urine Culture  . Hemoglobin A1c  . POCT urinalysis dipstick   Tawnya Crook CNM, [facility] 01/13/2017 11:38 AM

## 2017-01-14 ENCOUNTER — Other Ambulatory Visit: Payer: Medicare HMO

## 2017-01-14 ENCOUNTER — Encounter: Payer: Medicare HMO | Admitting: Obstetrics & Gynecology

## 2017-01-14 LAB — HEMOGLOBIN A1C
ESTIMATED AVERAGE GLUCOSE: 105 mg/dL
Hgb A1c MFr Bld: 5.3 % (ref 4.8–5.6)

## 2017-01-16 ENCOUNTER — Other Ambulatory Visit: Payer: Medicare HMO | Admitting: Obstetrics and Gynecology

## 2017-01-16 ENCOUNTER — Other Ambulatory Visit: Payer: Self-pay | Admitting: Women's Health

## 2017-01-16 ENCOUNTER — Ambulatory Visit (INDEPENDENT_AMBULATORY_CARE_PROVIDER_SITE_OTHER): Payer: Medicare HMO | Admitting: Obstetrics and Gynecology

## 2017-01-16 DIAGNOSIS — Z1389 Encounter for screening for other disorder: Secondary | ICD-10-CM

## 2017-01-16 DIAGNOSIS — O0990 Supervision of high risk pregnancy, unspecified, unspecified trimester: Secondary | ICD-10-CM

## 2017-01-16 DIAGNOSIS — O30043 Twin pregnancy, dichorionic/diamniotic, third trimester: Secondary | ICD-10-CM

## 2017-01-16 DIAGNOSIS — R8271 Bacteriuria: Secondary | ICD-10-CM

## 2017-01-16 DIAGNOSIS — Z3009 Encounter for other general counseling and advice on contraception: Secondary | ICD-10-CM

## 2017-01-16 DIAGNOSIS — O409XX2 Polyhydramnios, unspecified trimester, fetus 2: Secondary | ICD-10-CM

## 2017-01-16 DIAGNOSIS — Z331 Pregnant state, incidental: Secondary | ICD-10-CM

## 2017-01-16 DIAGNOSIS — O409XX1 Polyhydramnios, unspecified trimester, fetus 1: Secondary | ICD-10-CM

## 2017-01-16 DIAGNOSIS — O24113 Pre-existing diabetes mellitus, type 2, in pregnancy, third trimester: Secondary | ICD-10-CM

## 2017-01-16 DIAGNOSIS — R82998 Other abnormal findings in urine: Secondary | ICD-10-CM

## 2017-01-16 DIAGNOSIS — O2603 Excessive weight gain in pregnancy, third trimester: Secondary | ICD-10-CM

## 2017-01-16 DIAGNOSIS — O09513 Supervision of elderly primigravida, third trimester: Secondary | ICD-10-CM

## 2017-01-16 DIAGNOSIS — O9989 Other specified diseases and conditions complicating pregnancy, childbirth and the puerperium: Principal | ICD-10-CM

## 2017-01-16 DIAGNOSIS — Z3A34 34 weeks gestation of pregnancy: Secondary | ICD-10-CM

## 2017-01-16 DIAGNOSIS — O99891 Other specified diseases and conditions complicating pregnancy: Secondary | ICD-10-CM

## 2017-01-16 LAB — POCT URINALYSIS DIPSTICK
Glucose, UA: NEGATIVE
KETONES UA: NEGATIVE

## 2017-01-16 LAB — URINE CULTURE

## 2017-01-16 MED ORDER — SULFAMETHOXAZOLE-TRIMETHOPRIM 800-160 MG PO TABS
1.0000 | ORAL_TABLET | Freq: Two times a day (BID) | ORAL | 0 refills | Status: DC
Start: 2017-01-16 — End: 2017-01-27

## 2017-01-16 NOTE — Progress Notes (Addendum)
Heather [name] is a 41 y.o. female High Risk Pregnancy HROB Diagnosis(es):   Di-Di Twin gestation, Class B DM, AMA, morbid obesity w/ excessive pregnancy weight gain, polyhydramnios x 2, depression.   G2P0010 [redacted]w[redacted]d Estimated Date of Delivery: 02/22/17    HPI: The patient is being seen today for ongoing management of Di-Di Twin gestation, Class B DM, AMA, morbid obesity w/ excessive pregnancy weight gain, polyhydramnios x 2, depression.  Marland Kitchendiscussed contraception plans: pt hesitant to consider Tubal though she thinks she is Probably not interested in future pregnancy. WANTS NEXPLANON. Today she reports no complaints. Pt states she probably does not want to have any more babies but is unsure, so she has not decided yet for a tubal ligation. Pt is considering the Nexplanon for [facility], and says she has had it before. Pt is no longer working. Pt has lost 11 lbs since last visit.  Electronic scale variance may be a factor at pt bmi. Pt has had epidural steroids before for a back surgery. Pt has had an A1C on Monday.=5.3 (NL)  Patient reports good fetal movement x2, denies any bleeding and no rupture of membranes symptoms or regular contractions.   BP weight and urine results reviewed and noted. Blood pressure (!) 142/70, pulse (!) 101, weight (!) 455 lb 12.8 oz (206.7 kg), last menstrual period 05/18/2016.  Fundal Height: U +35 Fetal Heart rate:  Not done Physical Examination: Abdomen - soft, nontender, nondistended, no masses or organomegaly Baby A is breech Baby B is transverse/vertex                                      Pelvic - Not indicated                                     Edema:  1+  Urinalysis:  3+ blood, trace protein, 2+ leukocytes  Fetal Surveillance Testing today: u/S POSITION BY jvf. BREECH Baby A, transverse baby B.  Lab and sonogram results have been reviewed.  Assessment:  1.  Pregnancy at [redacted]w[redacted]d,  G2P0010   :  Estimated Date of Delivery: 02/22/17                        2.   Di-Di Twins, with FETAL MALPRESENTATION breech A, TRANSVERSE B.                        3. Class B DM-no meds   4. Morbid obesity w/ excessive pregnancy weight gain Body mass index is 79.27 kg/m.    5. Polyhydramnios x2   6. Depression   Medication(s) Plans:  none  Treatment Plan:  1. Growth u/s @ 35, 38wks       2.  2x/wk testing        3. Deliver @ 38wks or earlier if clinically indicated   4. Pt is to track her glucose in a journal   5. BP check 2x weekly  Follow up in 4 days for appointment for high risk OB care, Di-Di Twin gestation, Class B DM, AMA, morbid obesity w/ excessive pregnancy weight gain, polyhydramnios x 2, depression, BPP 1x weekly, Growth U/S, check cervix, sign C-section form if pt decides tubal ligation, BP check 2x weekly FETAL MONITORING TO BE WEEKLY BPP.  By signing my name below, I, Izna Ahmed, attest that this documentation has been prepared under the direction and in the presence of Jonnie Kind, MD. Electronically Signed: Jabier Gauss, Medical Scribe. 01/16/17. 12:06 PM.  I personally performed the services described in this documentation, which was SCRIBED in my presence. The recorded information has been reviewed and considered accurate. It has been edited as necessary during review. Jonnie Kind, MD

## 2017-01-17 ENCOUNTER — Telehealth: Payer: Self-pay | Admitting: *Deleted

## 2017-01-17 ENCOUNTER — Other Ambulatory Visit: Payer: Medicare HMO | Admitting: Obstetrics and Gynecology

## 2017-01-17 ENCOUNTER — Other Ambulatory Visit: Payer: Self-pay | Admitting: Women's Health

## 2017-01-17 DIAGNOSIS — O09523 Supervision of elderly multigravida, third trimester: Secondary | ICD-10-CM

## 2017-01-17 DIAGNOSIS — O24119 Pre-existing diabetes mellitus, type 2, in pregnancy, unspecified trimester: Secondary | ICD-10-CM

## 2017-01-17 DIAGNOSIS — O30049 Twin pregnancy, dichorionic/diamniotic, unspecified trimester: Secondary | ICD-10-CM

## 2017-01-17 DIAGNOSIS — Z6841 Body Mass Index (BMI) 40.0 and over, adult: Secondary | ICD-10-CM

## 2017-01-17 DIAGNOSIS — Z3009 Encounter for other general counseling and advice on contraception: Secondary | ICD-10-CM | POA: Insufficient documentation

## 2017-01-17 DIAGNOSIS — O0993 Supervision of high risk pregnancy, unspecified, third trimester: Secondary | ICD-10-CM

## 2017-01-17 NOTE — Telephone Encounter (Signed)
LMOVM that she has UTI and batrim was sent to pharmacy. Advised to take all of the medication even if she has no symptoms.

## 2017-01-20 ENCOUNTER — Encounter: Payer: Self-pay | Admitting: Obstetrics and Gynecology

## 2017-01-20 ENCOUNTER — Ambulatory Visit (INDEPENDENT_AMBULATORY_CARE_PROVIDER_SITE_OTHER): Payer: Medicare HMO | Admitting: Obstetrics and Gynecology

## 2017-01-20 ENCOUNTER — Ambulatory Visit (INDEPENDENT_AMBULATORY_CARE_PROVIDER_SITE_OTHER): Payer: Medicare HMO

## 2017-01-20 VITALS — BP 140/100 | HR 99 | Wt >= 6400 oz

## 2017-01-20 DIAGNOSIS — O24113 Pre-existing diabetes mellitus, type 2, in pregnancy, third trimester: Secondary | ICD-10-CM

## 2017-01-20 DIAGNOSIS — O133 Gestational [pregnancy-induced] hypertension without significant proteinuria, third trimester: Secondary | ICD-10-CM | POA: Insufficient documentation

## 2017-01-20 DIAGNOSIS — O0990 Supervision of high risk pregnancy, unspecified, unspecified trimester: Secondary | ICD-10-CM

## 2017-01-20 DIAGNOSIS — Z6841 Body Mass Index (BMI) 40.0 and over, adult: Secondary | ICD-10-CM

## 2017-01-20 DIAGNOSIS — O09523 Supervision of elderly multigravida, third trimester: Secondary | ICD-10-CM

## 2017-01-20 DIAGNOSIS — O30043 Twin pregnancy, dichorionic/diamniotic, third trimester: Secondary | ICD-10-CM

## 2017-01-20 DIAGNOSIS — Z3A36 36 weeks gestation of pregnancy: Secondary | ICD-10-CM

## 2017-01-20 DIAGNOSIS — O403XX2 Polyhydramnios, third trimester, fetus 2: Secondary | ICD-10-CM

## 2017-01-20 DIAGNOSIS — O0993 Supervision of high risk pregnancy, unspecified, third trimester: Secondary | ICD-10-CM

## 2017-01-20 DIAGNOSIS — O2603 Excessive weight gain in pregnancy, third trimester: Secondary | ICD-10-CM

## 2017-01-20 DIAGNOSIS — Z1389 Encounter for screening for other disorder: Secondary | ICD-10-CM

## 2017-01-20 DIAGNOSIS — O24119 Pre-existing diabetes mellitus, type 2, in pregnancy, unspecified trimester: Secondary | ICD-10-CM

## 2017-01-20 DIAGNOSIS — O30049 Twin pregnancy, dichorionic/diamniotic, unspecified trimester: Secondary | ICD-10-CM

## 2017-01-20 DIAGNOSIS — Z3A35 35 weeks gestation of pregnancy: Secondary | ICD-10-CM

## 2017-01-20 DIAGNOSIS — Z331 Pregnant state, incidental: Secondary | ICD-10-CM

## 2017-01-20 DIAGNOSIS — O09513 Supervision of elderly primigravida, third trimester: Secondary | ICD-10-CM

## 2017-01-20 DIAGNOSIS — O403XX1 Polyhydramnios, third trimester, fetus 1: Secondary | ICD-10-CM

## 2017-01-20 LAB — POCT URINALYSIS DIPSTICK
Blood, UA: NEGATIVE
GLUCOSE UA: NEGATIVE
KETONES UA: NEGATIVE
NITRITE UA: NEGATIVE

## 2017-01-20 NOTE — Progress Notes (Signed)
Heather [name] is a 41 y.o. female High Risk Pregnancy HROB Diagnosis(es):   Di-Di Twin gestation, , AMA, morbid obesity w/ excessive pregnancy weight gain, polyhydramnios x 2, depression.  Discussed contraception plans: pt hesitant to consider Tubal though she thinks she is Probably not interested in future pregnancy. WANTS NEXPLANON  G2P0010 [redacted]w[redacted]d Estimated Date of Delivery: 02/22/17    HPI: The patient is being seen today for ongoing management of  Di-Di Twin gestation, Class B DM, AMA, morbid obesity w/ excessive pregnancy weight gain, polyhydramnios x 2, depression.  Today she reports no complaints. BP is high today, and was rechecked again with same numbers.  Patient reports good fetal movement, denies any bleeding and no rupture of membranes symptoms or regular contractions.   BP weight and urine results reviewed and noted. Blood pressure (!) 140/100, pulse 99, weight (!) 468 lb (212.3 kg), last menstrual period 05/18/2016. Recheck 140/100.  Denies h/a, scotoma, ruq pain.  Fundal Height:  Not done was U +35 at last weeks visit. Fetal Heart rate:  A: 152 bpm   B: 157 bpm by u/s Physical Examination: Abdomen - soft, nontender, nondistended, no masses or organomegaly                                     Pelvic - not indicated                                     Edema:  1+  Urinalysis: trace protein, 2+ leukocytes  Fetal Surveillance Testing today:  U/S: breech left A, CEPHALIC right B.  Lab and sonogram results have been reviewed.  Assessment:  1.  Pregnancy at [redacted]w[redacted]d,  G2P0010   :  Estimated Date of Delivery: 02/22/17                       2.  Di-Di Twins, with FETAL MALPRESENTATION breech A, CEPHALIC B.                        3. Class B DM-no meds                         4. Morbid obesity w/ excessive pregnancy weight gain Body mass index is 79.27 kg/m.                         5. Polyhydramnios x2                         6. Depression  Medication(s) Plans:   none  Treatment Plan:  1. Growth u/s @ 35, 38wks                          2. 2x/wk testing                          3. Deliver @ 37wks (02/03/17) or earlier if clinically indicated                         4. Pt is to track her glucose in a journal  5. BP check 2x weekly   6. 24 Hr urine collection + blood-work   Follow up in 4 days for appointment for high risk OB care, Di-Di Twin gestation, Class B DM, AMA, morbid obesity w/ excessive pregnancy weight gain, polyhydramnios x 2, depression ,BP check 2x weekly, FETAL MONITORING TO BE WEEKLY BPP.  Collect 24 hr TP today, pih labs, cbc, cmet.    By signing my name below, I, Izna Ahmed, attest that this documentation has been prepared under the direction and in the presence of Jonnie Kind, MD. Electronically Signed: Jabier Gauss, Medical Scribe. 01/20/17. 10:47 AM.  I personally performed the services described in this documentation, which was SCRIBED in my presence. The recorded information has been reviewed and considered accurate. It has been edited as necessary during review. Jonnie Kind, MD

## 2017-01-20 NOTE — Progress Notes (Signed)
[place] 36+2 wks,DI/DI twins,anterior pl gr 3,bilat adnexa's wnl BABY A: breech left,fhr 152 bpm,svp of fluid 8.4 cm,BPP 8/8 BABY B: cephalic right,fhr 350 bpm,svp of fluid 12 cm,BPP 8/8

## 2017-01-21 ENCOUNTER — Other Ambulatory Visit: Payer: Medicare HMO

## 2017-01-21 ENCOUNTER — Encounter: Payer: Medicare HMO | Admitting: Obstetrics & Gynecology

## 2017-01-21 DIAGNOSIS — O133 Gestational [pregnancy-induced] hypertension without significant proteinuria, third trimester: Secondary | ICD-10-CM | POA: Diagnosis not present

## 2017-01-21 LAB — COMPREHENSIVE METABOLIC PANEL
A/G RATIO: 1.2 (ref 1.2–2.2)
ALBUMIN: 3.6 g/dL (ref 3.5–5.5)
ALT: 10 IU/L (ref 0–32)
AST: 15 IU/L (ref 0–40)
Alkaline Phosphatase: 120 IU/L — ABNORMAL HIGH (ref 39–117)
BUN/Creatinine Ratio: 15 (ref 9–23)
BUN: 10 mg/dL (ref 6–20)
Bilirubin Total: <0.2 mg/dL (ref 0.0–1.2)
CALCIUM: 9.9 mg/dL (ref 8.7–10.2)
CO2: 23 mmol/L (ref 20–29)
CREATININE: 0.66 mg/dL (ref 0.57–1.00)
Chloride: 99 mmol/L (ref 96–106)
GFR, EST AFRICAN AMERICAN: 129 mL/min/{1.73_m2} (ref >59)
GFR, EST NON AFRICAN AMERICAN: 112 mL/min/{1.73_m2} (ref >59)
GLOBULIN, TOTAL: 3.1 g/dL (ref 1.5–4.5)
Glucose: 71 mg/dL (ref 65–99)
POTASSIUM: 5.1 mmol/L (ref 3.5–5.2)
SODIUM: 138 mmol/L (ref 134–144)
TOTAL PROTEIN: 6.7 g/dL (ref 6.0–8.5)

## 2017-01-21 LAB — CBC
HEMATOCRIT: 37.5 % (ref 34.0–46.6)
HEMOGLOBIN: 11.6 g/dL (ref 11.1–15.9)
MCH: 25.9 pg — ABNORMAL LOW (ref 26.6–33.0)
MCHC: 30.9 g/dL — ABNORMAL LOW (ref 31.5–35.7)
MCV: 84 fL (ref 79–97)
Platelets: 383 10*3/uL — ABNORMAL HIGH (ref 150–379)
RBC: 4.48 x10E6/uL (ref 3.77–5.28)
RDW: 18 % — ABNORMAL HIGH (ref 12.3–15.4)
WBC: 12.7 10*3/uL — AB (ref 3.4–10.8)

## 2017-01-22 LAB — PROTEIN, URINE, 24 HOUR
PROTEIN 24H UR: 693 mg/(24.h) — AB (ref 30–150)
Protein, Ur: 23.5 mg/dL

## 2017-01-23 ENCOUNTER — Ambulatory Visit (INDEPENDENT_AMBULATORY_CARE_PROVIDER_SITE_OTHER): Payer: Medicare HMO | Admitting: Obstetrics & Gynecology

## 2017-01-23 ENCOUNTER — Encounter: Payer: Self-pay | Admitting: Obstetrics & Gynecology

## 2017-01-23 VITALS — BP 130/90 | HR 107 | Wt >= 6400 oz

## 2017-01-23 DIAGNOSIS — Z331 Pregnant state, incidental: Secondary | ICD-10-CM

## 2017-01-23 DIAGNOSIS — O403XX1 Polyhydramnios, third trimester, fetus 1: Secondary | ICD-10-CM

## 2017-01-23 DIAGNOSIS — Z3A35 35 weeks gestation of pregnancy: Secondary | ICD-10-CM

## 2017-01-23 DIAGNOSIS — O1403 Mild to moderate pre-eclampsia, third trimester: Secondary | ICD-10-CM

## 2017-01-23 DIAGNOSIS — O409XX0 Polyhydramnios, unspecified trimester, not applicable or unspecified: Secondary | ICD-10-CM

## 2017-01-23 DIAGNOSIS — O403XX2 Polyhydramnios, third trimester, fetus 2: Secondary | ICD-10-CM

## 2017-01-23 DIAGNOSIS — O0990 Supervision of high risk pregnancy, unspecified, unspecified trimester: Secondary | ICD-10-CM

## 2017-01-23 DIAGNOSIS — Z1389 Encounter for screening for other disorder: Secondary | ICD-10-CM

## 2017-01-23 DIAGNOSIS — O2603 Excessive weight gain in pregnancy, third trimester: Secondary | ICD-10-CM

## 2017-01-23 DIAGNOSIS — O24113 Pre-existing diabetes mellitus, type 2, in pregnancy, third trimester: Secondary | ICD-10-CM

## 2017-01-23 DIAGNOSIS — O30043 Twin pregnancy, dichorionic/diamniotic, third trimester: Secondary | ICD-10-CM

## 2017-01-23 LAB — POCT URINALYSIS DIPSTICK
Blood, UA: 3
GLUCOSE UA: NEGATIVE
KETONES UA: NEGATIVE
NITRITE UA: NEGATIVE
pH, UA: 5 (ref 5.0–8.0)

## 2017-01-23 NOTE — Progress Notes (Signed)
Fetal Surveillance Testing today:  BPP 3 days ago 8/8/x 2, FHR 155/145 today   High Risk Pregnancy Diagnosis(es):   Kathi Der twins, polyhydramniso x 2, morbid obesity, mild pre eclampsia, borderline  G2P0010 [redacted]w[redacted]d Estimated Date of Delivery: 02/22/17  Blood pressure 130/90, pulse (!) 107, weight (!) 462 lb (209.6 kg), last menstrual period 05/18/2016.  Urinalysis: 1+ protein   HPI: The patient is being seen today for ongoing management of as above. Today she reports CBG are all good, no headache or CNS symptoms   BP weight and urine results all reviewed and noted. Patient reports good fetal movement, denies any bleeding and no rupture of membranes symptoms or regular contractions.  Fundal Height:  U+44 Fetal Heart rate:  155/145 Edema:  Difficult to assess but seems to be stable, no cellulitis  Patient is without complaints other than noted in her HPI. All questions were answered.  All lab and sonogram results have been reviewed. Comments:    Assessment:  1.  Pregnancy at [redacted]w[redacted]d,  Estimated Date of Delivery: 02/22/17 :                          2.  Kathi Der twins                        3.  Polyhydramnios x 2                        4.  Mild pre eclampsia, borderline                        5.  Morbid obesity  Medication(s) Plans:  none  Treatment Plan:  Keep scheduled appointment on Monday, already scheduled BP is better, difficult to assess 24 hour urine due to her difficulty in collecting a good specimen, but will assume is in the mild range, if BP worsens will consider moving up scheduled dellivery timing  Pt aware of that possibility  Return for keep scheduled. for appointment for high risk OB care  No orders of the defined types were placed in this encounter.  Orders Placed This Encounter  Procedures  . POCT urinalysis dipstick

## 2017-01-24 ENCOUNTER — Other Ambulatory Visit: Payer: Self-pay | Admitting: Obstetrics & Gynecology

## 2017-01-24 ENCOUNTER — Other Ambulatory Visit: Payer: Medicare HMO | Admitting: Obstetrics & Gynecology

## 2017-01-24 DIAGNOSIS — O30003 Twin pregnancy, unspecified number of placenta and unspecified number of amniotic sacs, third trimester: Secondary | ICD-10-CM

## 2017-01-24 DIAGNOSIS — O99213 Obesity complicating pregnancy, third trimester: Secondary | ICD-10-CM

## 2017-01-27 ENCOUNTER — Ambulatory Visit (INDEPENDENT_AMBULATORY_CARE_PROVIDER_SITE_OTHER): Payer: Medicare HMO | Admitting: Obstetrics and Gynecology

## 2017-01-27 ENCOUNTER — Ambulatory Visit (INDEPENDENT_AMBULATORY_CARE_PROVIDER_SITE_OTHER): Payer: Medicare HMO

## 2017-01-27 VITALS — BP 132/76 | HR 105 | Wt >= 6400 oz

## 2017-01-27 DIAGNOSIS — O24113 Pre-existing diabetes mellitus, type 2, in pregnancy, third trimester: Secondary | ICD-10-CM

## 2017-01-27 DIAGNOSIS — Z331 Pregnant state, incidental: Secondary | ICD-10-CM

## 2017-01-27 DIAGNOSIS — O30043 Twin pregnancy, dichorionic/diamniotic, third trimester: Secondary | ICD-10-CM | POA: Diagnosis not present

## 2017-01-27 DIAGNOSIS — O24119 Pre-existing diabetes mellitus, type 2, in pregnancy, unspecified trimester: Secondary | ICD-10-CM

## 2017-01-27 DIAGNOSIS — O0990 Supervision of high risk pregnancy, unspecified, unspecified trimester: Secondary | ICD-10-CM

## 2017-01-27 DIAGNOSIS — Z3A36 36 weeks gestation of pregnancy: Secondary | ICD-10-CM

## 2017-01-27 DIAGNOSIS — O0993 Supervision of high risk pregnancy, unspecified, third trimester: Secondary | ICD-10-CM

## 2017-01-27 DIAGNOSIS — O30003 Twin pregnancy, unspecified number of placenta and unspecified number of amniotic sacs, third trimester: Secondary | ICD-10-CM

## 2017-01-27 DIAGNOSIS — O99343 Other mental disorders complicating pregnancy, third trimester: Secondary | ICD-10-CM

## 2017-01-27 DIAGNOSIS — O09523 Supervision of elderly multigravida, third trimester: Secondary | ICD-10-CM | POA: Diagnosis not present

## 2017-01-27 DIAGNOSIS — O403XX1 Polyhydramnios, third trimester, fetus 1: Secondary | ICD-10-CM

## 2017-01-27 DIAGNOSIS — O403XX2 Polyhydramnios, third trimester, fetus 2: Secondary | ICD-10-CM

## 2017-01-27 DIAGNOSIS — Z1389 Encounter for screening for other disorder: Secondary | ICD-10-CM

## 2017-01-27 DIAGNOSIS — O09513 Supervision of elderly primigravida, third trimester: Secondary | ICD-10-CM

## 2017-01-27 DIAGNOSIS — O1403 Mild to moderate pre-eclampsia, third trimester: Secondary | ICD-10-CM

## 2017-01-27 DIAGNOSIS — O3093 Multiple gestation, unspecified, third trimester: Secondary | ICD-10-CM

## 2017-01-27 DIAGNOSIS — O2603 Excessive weight gain in pregnancy, third trimester: Secondary | ICD-10-CM

## 2017-01-27 DIAGNOSIS — Z6841 Body Mass Index (BMI) 40.0 and over, adult: Secondary | ICD-10-CM

## 2017-01-27 DIAGNOSIS — O321XX1 Maternal care for breech presentation, fetus 1: Secondary | ICD-10-CM

## 2017-01-27 DIAGNOSIS — O329XX2 Maternal care for malpresentation of fetus, unspecified, fetus 2: Secondary | ICD-10-CM

## 2017-01-27 DIAGNOSIS — O30049 Twin pregnancy, dichorionic/diamniotic, unspecified trimester: Secondary | ICD-10-CM

## 2017-01-27 DIAGNOSIS — O329XX0 Maternal care for malpresentation of fetus, unspecified, not applicable or unspecified: Secondary | ICD-10-CM

## 2017-01-27 DIAGNOSIS — O99213 Obesity complicating pregnancy, third trimester: Secondary | ICD-10-CM

## 2017-01-27 LAB — POCT URINALYSIS DIPSTICK
Blood, UA: NEGATIVE
GLUCOSE UA: NEGATIVE
Ketones, UA: NEGATIVE
LEUKOCYTES UA: NEGATIVE
Nitrite, UA: NEGATIVE

## 2017-01-27 NOTE — Progress Notes (Signed)
[place] DI/DI TWINS 36+2 wks,ant pl gr 3,bilat adnexa's wnl BABY A: breech left,BPP 8/8,FHR 159 bpm,svp of fluid 8 cm,efw 2662 g 31% BABY B: trans head left,BPP 8/8,FHR 157 bpm,12.4 cm,efw 2407 g 15%,discordance 9.6%

## 2017-01-27 NOTE — Progress Notes (Signed)
Patient ID: Heather [name], female   DOB: 17-Nov-1975, 41 y.o.   MRN: [id number] Fetal Surveillance Testing today:  BPP   High Risk Pregnancy Diagnosis(es):   Di-Di Twin gestation, Class B DM, AMA, morbid obesity w/ excessive pregnancy weight gain, polyhydramnios x 2, depression  Discussed contraception plans: pt hesitant to consider Tubal though she thinks she is Probably not interested in future pregnancy. WANTS NEXPLANON  G2P0010 [redacted]w[redacted]d Estimated Date of Delivery: 02/22/17  Blood pressure 132/76, pulse (!) 105, weight (!) 466 lb (211.4 kg), last menstrual period 05/18/2016.  Urinalysis: 1+ protein otherwise negative  HPI: The patient is being seen today for ongoing management of Di-Di Twin gestation, Class B DM, AMA, morbid obesity w/ excessive pregnancy weight gain, polyhydramnios x 2, depression. Today she reports that she gained more weight. Otherwise she denies any other symptoms or complaints at this time.   BP weight and urine results all reviewed and noted. Patient reports good fetal movement, denies any bleeding and no rupture of membranes symptoms or regular contractions.  Fundal Height: not done Fetal Heart rate:  Baby A: 159, Baby B: 157, per U/S Edema:    Patient is without complaints other than noted in her HPI. All questions were answered.  All labs and sonogram results have been reviewed.  Assessment:   1. Pregnancy at [redacted]w[redacted]d,  Estimated Date of Delivery: 02/22/17 : c-section scheduled 02/03/2017 2. Di-Di Twins, with breech A, trans head B. Symmetric growth 3. Class B DM-no meds 4. Morbid obesity w/ excessive pregnancy weight gain Body mass index is 81.05 kg/m. 5. Polyhydramniosx2 6. Depression   Medication(s) Plans:  none  Treatment Plan:   1. 2x/wk testing  2. Deliver @ 37wks (02/03/17) or earlier if clinically indicated 3. Pt is to track her glucose in a journal 4. BP check 2x weekly 5. C-section scheduled 02/03/2017 @ 2 pm  Follow up in 3 days for  appointment for high risk OB care, Di-Di Twin gestation, Class B DM, AMA, morbid obesity w/ excessive pregnancy weight gain, polyhydramnios x 2, depression ,BP check 2x weekly, FETAL MONITORING TO BE WEEKLY BPP.  No Follow-up on file. for appointment for high risk OB care  No orders of the defined types were placed in this encounter.  Orders Placed This Encounter  Procedures  . POCT Urinalysis Dipstick   By signing my name below, I, Margit Banda, attest that this documentation has been prepared under the direction and in the presence of Jonnie Kind, MD. Electronically Signed: Margit Banda, Medical Scribe. 01/27/17. 3:00 PM.  I personally performed the services described in this documentation, which was SCRIBED in my presence. The recorded information has been reviewed and considered accurate. It has been edited as necessary during review. Jonnie Kind, MD

## 2017-01-28 ENCOUNTER — Telehealth (HOSPITAL_COMMUNITY): Payer: Self-pay | Admitting: *Deleted

## 2017-01-28 NOTE — Telephone Encounter (Signed)
Preadmission screen  

## 2017-01-29 ENCOUNTER — Encounter (HOSPITAL_COMMUNITY): Payer: Self-pay

## 2017-01-30 ENCOUNTER — Other Ambulatory Visit: Payer: Medicare HMO | Admitting: Women's Health

## 2017-01-30 ENCOUNTER — Encounter: Payer: Self-pay | Admitting: Women's Health

## 2017-01-30 ENCOUNTER — Other Ambulatory Visit: Payer: Self-pay | Admitting: Obstetrics and Gynecology

## 2017-01-30 ENCOUNTER — Ambulatory Visit (INDEPENDENT_AMBULATORY_CARE_PROVIDER_SITE_OTHER): Payer: Medicare HMO | Admitting: Women's Health

## 2017-01-30 VITALS — BP 150/80 | HR 122 | Wt >= 6400 oz

## 2017-01-30 DIAGNOSIS — O1403 Mild to moderate pre-eclampsia, third trimester: Secondary | ICD-10-CM | POA: Diagnosis not present

## 2017-01-30 DIAGNOSIS — O1213 Gestational proteinuria, third trimester: Secondary | ICD-10-CM

## 2017-01-30 DIAGNOSIS — O409XX0 Polyhydramnios, unspecified trimester, not applicable or unspecified: Secondary | ICD-10-CM

## 2017-01-30 DIAGNOSIS — O24113 Pre-existing diabetes mellitus, type 2, in pregnancy, third trimester: Secondary | ICD-10-CM

## 2017-01-30 DIAGNOSIS — O403XX2 Polyhydramnios, third trimester, fetus 2: Secondary | ICD-10-CM

## 2017-01-30 DIAGNOSIS — O2603 Excessive weight gain in pregnancy, third trimester: Secondary | ICD-10-CM

## 2017-01-30 DIAGNOSIS — Z3A36 36 weeks gestation of pregnancy: Secondary | ICD-10-CM

## 2017-01-30 DIAGNOSIS — O30043 Twin pregnancy, dichorionic/diamniotic, third trimester: Secondary | ICD-10-CM | POA: Diagnosis not present

## 2017-01-30 DIAGNOSIS — O09513 Supervision of elderly primigravida, third trimester: Secondary | ICD-10-CM

## 2017-01-30 DIAGNOSIS — O403XX1 Polyhydramnios, third trimester, fetus 1: Secondary | ICD-10-CM

## 2017-01-30 DIAGNOSIS — O0993 Supervision of high risk pregnancy, unspecified, third trimester: Secondary | ICD-10-CM

## 2017-01-30 DIAGNOSIS — Z1389 Encounter for screening for other disorder: Secondary | ICD-10-CM

## 2017-01-30 DIAGNOSIS — Z331 Pregnant state, incidental: Secondary | ICD-10-CM

## 2017-01-30 LAB — POCT URINALYSIS DIPSTICK
GLUCOSE UA: NEGATIVE
Nitrite, UA: NEGATIVE

## 2017-01-30 NOTE — Progress Notes (Addendum)
HIGH-RISK PREGNANCY VISIT Patient name: Heather [name] MRN [id number]  Date of birth: 1976-02-24 Chief Complaint:   High Risk Gestation (BP check; stuffy nose, sorethroat this am)  History of Present Illness:   Heather [name] is a 41 y.o. G44P0010 female at [redacted]w[redacted]d with an Estimated Date of Delivery: 02/22/17 being seen today for ongoing management of a high-risk pregnancy complicated by Di-Di Twin gestation, Class B DM, AMA, morbid obesity w/ excessive pregnancy weight gain, polyhydramnios x 2, pre-eclampsia w/o severe features, depression. Has PLTCS scheduled for 10/29 @ 1400, goes tomorrow for her pre-op @ 1200. Is thinking about BTL w/ c/s, hasn't decided for sure yet.  Today she reports no longer checking cbg's-all good, last A1C 5.3. Does report some sinus conjestion x 1 day, mild sore throat. Denies fever/chills, body aches. Denies ha, visual changes, ruq/epigastric pain, n/v.  Contractions: Irregular. Vag. Bleeding: None.  Movement: Present. denies leaking of fluid.  Review of Systems:   Pertinent items are noted in HPI Denies abnormal vaginal discharge w/ itching/odor/irritation, headaches, visual changes, shortness of breath, chest pain, abdominal pain, severe nausea/vomiting, or problems with urination or bowel movements unless otherwise stated above. Pertinent History Reviewed:  Reviewed past medical,surgical, social, obstetrical and family history.  Reviewed problem list, medications and allergies. Physical Assessment:   Vitals:   01/30/17 1407  BP: (!) 150/80  Pulse: (!) 122  Weight: (!) 457 lb 3.2 oz (207.4 kg)  Body mass index is 80.99 kg/m.           Physical Examination:   General appearance: alert, well appearing, and in no distress  Mental status: alert, oriented to person, place, and time  Skin: warm & dry   Extremities: Edema: Mild pitting, slight indentation DTRs 2+, no clonus   Cardiovascular: tachycardic today  Respiratory: normal respiratory effort, no  distress  Abdomen: gravid, soft, non-tender  Pelvic: Cervical exam deferred         Fetal Status: Fetal Heart Rate (bpm): 140/152   Movement: Present   Fetal Surveillance Testing today:  FHT doppler  Results for orders placed or performed in visit on 01/30/17 (from the past 24 hour(s))  POCT urinalysis dipstick   Collection Time: 01/30/17  2:08 PM  Result Value Ref Range   Color, UA     Clarity, UA     Glucose, UA neg    Bilirubin, UA     Ketones, UA 1+    Spec Grav, UA  1.010 - 1.025   Blood, UA 3+    pH, UA  5.0 - 8.0   Protein, UA 2+    Urobilinogen, UA  0.2 or 1.0 E.U./dL   Nitrite, UA neg    Leukocytes, UA Moderate (2+) (A) Negative    Assessment & Plan:  1) High-risk pregnancy G2P0010 at [redacted]w[redacted]d with an Estimated Date of Delivery: 02/22/17   2) Di-Di Twins, stable, per JVF have been doing weekly BPPs, 01/27/17 Baby A 30%, Baby B 15%, discordance 9.6%  3) Polyhydramnios x 2> Baby A SVP 8cm, Baby B SVP 12.4cm on 10/22  4) Pre-e w/o severe features> stable, bp non-severe range, 2+ proteinuria- has had 1+ intermittently since 22wks, asymptomatic. Of note had + urine cx 10/8, states she took her bactrim, urine w/ + blood, leuks, protein, ketones today- will send cx. Reviewed pre-e s/s, reasons to seek care prior to Monday's c/s  5) Class BDM, pt reports all sugars have been normal off meds, A1C normal, pt no longer checking sugars  per her report  6) Possible BTL w/ C/S> discussed risks/benefits, consent signed today in case she does want to do. She understands she can change her mind at any time up until the time of surgery.   Labs/procedures today: refuses GBS cx today, discussed that eventhough she is having a c/s, it is still helpful to have in case needed for tx of babies. Send gc/ct from urine  Treatment Plan:  Discussed all w/ Dr. Glo Herring via phone, including today's bp and proteinuria, recommends no change in plans for right now, will proceed with scheduled c/s on Monday  10/29 unless indicated earlier  Reviewed: pre-e s/s, Term labor symptoms and general obstetric precautions including but not limited to vaginal bleeding, contractions, leaking of fluid and fetal movement were reviewed in detail with the patient.  All questions were answered.  Follow-up: Return for As scheduled. 11/5 w/ JVF for incision check  Orders Placed This Encounter  Procedures  . Urine Culture  . GC/Chlamydia Probe Amp  . POCT urinalysis dipstick   Tawnya Crook CNM, [facility] 01/30/2017 1430

## 2017-01-30 NOTE — Patient Instructions (Signed)
Heather [name], I greatly value your feedback.  If you receive a survey following your visit with [place] today, we appreciate you taking the time to fill it out.  Thanks, Knute Neu, CNM, WHNP-BC   Call the office [phone]) or go to [facility] if:  You begin to have strong, frequent contractions  Your water breaks.  Sometimes it is a big gush of fluid, sometimes it is just a trickle that keeps getting your panties wet or running down your legs  You have vaginal bleeding.  It is normal to have a small amount of spotting if your cervix was checked.   You don't feel your baby moving like normal.  If you don't, get you something to eat and drink and lay down and focus on feeling your baby move.  You should feel at least 10 movements in 2 hours.  If you don't, you should call the office or go to [facility].    Call the office [phone]) or go to [facility] hospital for these signs of pre-eclampsia:  Severe headache that does not go away with Tylenol  Visual changes- seeing spots, double, blurred vision  Pain under your right breast or upper abdomen that does not go away with Tums or heartburn medicine  Nausea and/or vomiting  Severe swelling in your hands, feet, and face      Preeclampsia and Eclampsia Preeclampsia is a serious condition that develops only during pregnancy. It is also called toxemia of pregnancy. This condition causes high blood pressure along with other symptoms, such as swelling and headaches. These symptoms may develop as the condition gets worse. Preeclampsia may occur at 20 weeks of pregnancy or later. Diagnosing and treating preeclampsia early is very important. If not treated early, it can cause serious problems for you and your baby. One problem it can lead to is eclampsia, which is a condition that causes muscle jerking or shaking (convulsions or seizures) in the mother. Delivering your baby is the best treatment for preeclampsia or eclampsia. Preeclampsia  and eclampsia symptoms usually go away after your baby is born. What are the causes? The cause of preeclampsia is not known. What increases the risk? The following risk factors make you more likely to develop preeclampsia:  Being pregnant for the first time.  Having had preeclampsia during a past pregnancy.  Having a family history of preeclampsia.  Having high blood pressure.  Being pregnant with twins or triplets.  Being 76 or older.  Being African-American.  Having kidney disease or diabetes.  Having medical conditions such as lupus or blood diseases.  Being very overweight (obese).  What are the signs or symptoms? The earliest signs of preeclampsia are:  High blood pressure.  Increased protein in your urine. Your health care provider will check for this at every visit before you give birth (prenatal visit).  Other symptoms that may develop as the condition gets worse include:  Severe headaches.  Sudden weight gain.  Swelling of the hands, face, legs, and feet.  Nausea and vomiting.  Vision problems, such as blurred or double vision.  Numbness in the face, arms, legs, and feet.  Urinating less than usual.  Dizziness.  Slurred speech.  Abdominal pain, especially upper abdominal pain.  Convulsions or seizures.  Symptoms generally go away after giving birth. How is this diagnosed? There are no screening tests for preeclampsia. Your health care provider will ask you about symptoms and check for signs of preeclampsia during your prenatal visits. You may also  have tests that include:  Urine tests.  Blood tests.  Checking your blood pressure.  Monitoring your baby's heart rate.  Ultrasound.  How is this treated? You and your health care provider will determine the treatment approach that is best for you. Treatment may include:  Having more frequent prenatal exams to check for signs of preeclampsia, if you have an increased risk for  preeclampsia.  Bed rest.  Reducing how much salt (sodium) you eat.  Medicine to lower your blood pressure.  Staying in the hospital, if your condition is severe. There, treatment will focus on controlling your blood pressure and the amount of fluids in your body (fluid retention).  You may need to take medicine (magnesium sulfate) to prevent seizures. This medicine may be given as an injection or through an IV tube.  Delivering your baby early, if your condition gets worse. You may have your labor started with medicine (induced), or you may have a cesarean delivery.  Follow these instructions at home: Eating and drinking   Drink enough fluid to keep your urine clear or pale yellow.  Eat a healthy diet that is low in sodium. Do not add salt to your food. Check nutrition labels to see how much sodium a food or beverage contains.  Avoid caffeine. Lifestyle  Do not use any products that contain nicotine or tobacco, such as cigarettes and e-cigarettes. If you need help quitting, ask your health care provider.  Do not use alcohol or drugs.  Avoid stress as much as possible. Rest and get plenty of sleep. General instructions  Take over-the-counter and prescription medicines only as told by your health care provider.  When lying down, lie on your side. This keeps pressure off of your baby.  When sitting or lying down, raise (elevate) your feet. Try putting some pillows underneath your lower legs.  Exercise regularly. Ask your health care provider what kinds of exercise are best for you.  Keep all follow-up and prenatal visits as told by your health care provider. This is important. How is this prevented? To prevent preeclampsia or eclampsia from developing during another pregnancy:  Get proper medical care during pregnancy. Your health care provider may be able to prevent preeclampsia or diagnose and treat it early.  Your health care provider may have you take a low-dose aspirin  or a calcium supplement during your next pregnancy.  You may have tests of your blood pressure and kidney function after giving birth.  Maintain a healthy weight. Ask your health care provider for help managing weight gain during pregnancy.  Work with your health care provider to manage any long-term (chronic) health conditions you have, such as diabetes or kidney problems.  Contact a health care provider if:  You gain more weight than expected.  You have headaches.  You have nausea or vomiting.  You have abdominal pain.  You feel dizzy or light-headed. Get help right away if:  You develop sudden or severe swelling anywhere in your body. This usually happens in the legs.  You gain 5 lbs (2.3 kg) or more during one week.  You have severe: ? Abdominal pain. ? Headaches. ? Dizziness. ? Vision problems. ? Confusion. ? Nausea or vomiting.  You have a seizure.  You have trouble moving any part of your body.  You develop numbness in any part of your body.  You have trouble speaking.  You have any abnormal bleeding.  You pass out. This information is not intended to replace advice given  to you by your health care provider. Make sure you discuss any questions you have with your health care provider. Document Released: 03/22/2000 Document Revised: 11/21/2015 Document Reviewed: 10/30/2015 Elsevier Interactive Patient Education  [employer].

## 2017-01-31 ENCOUNTER — Encounter (HOSPITAL_COMMUNITY)
Admission: RE | Admit: 2017-01-31 | Discharge: 2017-01-31 | Disposition: A | Discharge status: Home / Self Care | Payer: Medicare HMO | Source: Ambulatory Visit | Attending: Obstetrics and Gynecology | Admitting: Obstetrics and Gynecology

## 2017-01-31 ENCOUNTER — Telehealth (HOSPITAL_COMMUNITY): Payer: Self-pay | Admitting: *Deleted

## 2017-01-31 ENCOUNTER — Other Ambulatory Visit: Payer: Self-pay | Admitting: Obstetrics and Gynecology

## 2017-01-31 DIAGNOSIS — O1404 Mild to moderate pre-eclampsia, complicating childbirth: Secondary | ICD-10-CM | POA: Diagnosis present

## 2017-01-31 DIAGNOSIS — K219 Gastro-esophageal reflux disease without esophagitis: Secondary | ICD-10-CM | POA: Diagnosis present

## 2017-01-31 DIAGNOSIS — O329XX1 Maternal care for malpresentation of fetus, unspecified, fetus 1: Secondary | ICD-10-CM | POA: Diagnosis not present

## 2017-01-31 DIAGNOSIS — Z7951 Long term (current) use of inhaled steroids: Secondary | ICD-10-CM | POA: Diagnosis not present

## 2017-01-31 DIAGNOSIS — Z880 Allergy status to penicillin: Secondary | ICD-10-CM | POA: Diagnosis not present

## 2017-01-31 DIAGNOSIS — O9962 Diseases of the digestive system complicating childbirth: Secondary | ICD-10-CM | POA: Diagnosis present

## 2017-01-31 DIAGNOSIS — F250 Schizoaffective disorder, bipolar type: Secondary | ICD-10-CM | POA: Diagnosis present

## 2017-01-31 DIAGNOSIS — O30003 Twin pregnancy, unspecified number of placenta and unspecified number of amniotic sacs, third trimester: Secondary | ICD-10-CM | POA: Diagnosis not present

## 2017-01-31 DIAGNOSIS — J4530 Mild persistent asthma, uncomplicated: Secondary | ICD-10-CM | POA: Diagnosis present

## 2017-01-31 DIAGNOSIS — O321XX1 Maternal care for breech presentation, fetus 1: Secondary | ICD-10-CM | POA: Diagnosis present

## 2017-01-31 DIAGNOSIS — O99344 Other mental disorders complicating childbirth: Secondary | ICD-10-CM | POA: Diagnosis present

## 2017-01-31 DIAGNOSIS — O9952 Diseases of the respiratory system complicating childbirth: Secondary | ICD-10-CM | POA: Diagnosis present

## 2017-01-31 DIAGNOSIS — O43893 Other placental disorders, third trimester: Secondary | ICD-10-CM | POA: Diagnosis not present

## 2017-01-31 DIAGNOSIS — Z87891 Personal history of nicotine dependence: Secondary | ICD-10-CM | POA: Diagnosis not present

## 2017-01-31 DIAGNOSIS — O322XX2 Maternal care for transverse and oblique lie, fetus 2: Secondary | ICD-10-CM | POA: Diagnosis present

## 2017-01-31 DIAGNOSIS — O30043 Twin pregnancy, dichorionic/diamniotic, third trimester: Secondary | ICD-10-CM | POA: Diagnosis present

## 2017-01-31 DIAGNOSIS — O321XX0 Maternal care for breech presentation, not applicable or unspecified: Secondary | ICD-10-CM | POA: Diagnosis not present

## 2017-01-31 DIAGNOSIS — O99214 Obesity complicating childbirth: Secondary | ICD-10-CM | POA: Diagnosis present

## 2017-01-31 DIAGNOSIS — O403XX0 Polyhydramnios, third trimester, not applicable or unspecified: Secondary | ICD-10-CM | POA: Diagnosis present

## 2017-01-31 DIAGNOSIS — O1403 Mild to moderate pre-eclampsia, third trimester: Secondary | ICD-10-CM | POA: Diagnosis not present

## 2017-01-31 DIAGNOSIS — Z91040 Latex allergy status: Secondary | ICD-10-CM | POA: Diagnosis not present

## 2017-01-31 DIAGNOSIS — Z3A37 37 weeks gestation of pregnancy: Secondary | ICD-10-CM | POA: Diagnosis not present

## 2017-01-31 LAB — URINALYSIS, ROUTINE W REFLEX MICROSCOPIC
BILIRUBIN URINE: NEGATIVE
GLUCOSE, UA: NEGATIVE mg/dL
KETONES UR: NEGATIVE mg/dL
NITRITE: NEGATIVE
PH: 6 (ref 5.0–8.0)
Protein, ur: 30 mg/dL — AB
Specific Gravity, Urine: 1.015 (ref 1.005–1.030)

## 2017-01-31 LAB — CBC
HCT: 36 % (ref 36.0–46.0)
HEMOGLOBIN: 11.1 g/dL — AB (ref 12.0–15.0)
MCH: 26.5 pg (ref 26.0–34.0)
MCHC: 30.8 g/dL (ref 30.0–36.0)
MCV: 85.9 fL (ref 78.0–100.0)
Platelets: 277 10*3/uL (ref 150–400)
RBC: 4.19 MIL/uL (ref 3.87–5.11)
RDW: 18.3 % — ABNORMAL HIGH (ref 11.5–15.5)
WBC: 10.2 10*3/uL (ref 4.0–10.5)

## 2017-01-31 LAB — COMPREHENSIVE METABOLIC PANEL
ALT: 13 U/L — AB (ref 14–54)
AST: 16 U/L (ref 15–41)
Albumin: 2.7 g/dL — ABNORMAL LOW (ref 3.5–5.0)
Alkaline Phosphatase: 131 U/L — ABNORMAL HIGH (ref 38–126)
Anion gap: 7 (ref 5–15)
BUN: 14 mg/dL (ref 6–20)
CHLORIDE: 98 mmol/L — AB (ref 101–111)
CO2: 27 mmol/L (ref 22–32)
Calcium: 9 mg/dL (ref 8.9–10.3)
Creatinine, Ser: 0.47 mg/dL (ref 0.44–1.00)
Glucose, Bld: 85 mg/dL (ref 65–99)
POTASSIUM: 4.3 mmol/L (ref 3.5–5.1)
Sodium: 132 mmol/L — ABNORMAL LOW (ref 135–145)
Total Bilirubin: 0.2 mg/dL — ABNORMAL LOW (ref 0.3–1.2)
Total Protein: 6.9 g/dL (ref 6.5–8.1)

## 2017-01-31 LAB — TYPE AND SCREEN
ABO/RH(D): B POS
Antibody Screen: NEGATIVE

## 2017-01-31 LAB — ABO/RH: ABO/RH(D): B POS

## 2017-01-31 NOTE — Progress Notes (Unsigned)
Heather [name] is a 41 y.o. female G2P0010  at [redacted]w[redacted]d  presenting for Primary cesarean section due to Twin gestation she will be 37 weeks 2 days on 02/03/2017. Baby A is breech. Pregnancy has been complicated by morbid obesity with BMI of 80.9, a 64 pound weight gain during the pregnancy,, mild preeclampsia noted in the last couple of weeks without progression to severe disease, normal liver function tests, platelets and renal function to date. The patient is NOT CONSIDERING sterilization Preoperative labs are scheduled for 01/31/2017, anesthesia has been given a courtesy heads up regarding the complicated case OB History    Gravida Para Term Preterm AB Living   2 0     1     SAB TAB Ectopic Multiple Live Births   0   1         Past Medical History:  Diagnosis Date  . Amenorrhea   . Anxiety   . Anxiety   . Asthma   . COPD (chronic obstructive pulmonary disease) ([place])   . Depression   . Depression   . Diabetes mellitus without complication ([place])   . Dysmenorrhea   . Dysrhythmia    DR Johnsie Cancel    . Ectopic pregnancy 2013  . Eosinophilic esophagitis    Diagnosed at [facility] 06/16/2013, untreated  . GERD (gastroesophageal reflux disease)    HEARTBURN   TUMS  . Hard to intubate 11/07/2015  . Leukocytosis 07/28/2008   Qualifier: Diagnosis of  By: Jonna Munro MD, Roderic Scarce    . Morbid obesity ([place])   . Neuromuscular disorder ([facility])    RESTLESS LEG   . Obesity   . Schizoaffective disorder, bipolar type ([place])   . Sepsis ([place]) 11/11/2014  . Shortness of breath    WITH EXERTION   . Sleep apnea    CPAP   Past Surgical History:  Procedure Laterality Date  . CHOLECYSTECTOMY    . DENTAL SURGERY    . ESOPHAGOGASTRODUODENOSCOPY  May 2007   Dr. Gala Romney: Normal esophagus, stomach, D1, D2  . ESOPHAGOGASTRODUODENOSCOPY  06/16/2013   Dr. Carlton Adam, eosinophilic esophagitis, reactive gastropathy, no esophageal dilation  . TONSILLECTOMY    . TOOTH EXTRACTION  10/28/2011   Procedure: DENTAL  RESTORATION/EXTRACTIONS;  Surgeon: Gae Bon, DDS;  Location: [facility] OR;  Service: Oral Surgery;;   Family History: family history includes Allergic rhinitis in her sister; Anxiety disorder in her maternal grandmother and mother; COPD in her maternal grandmother; Cancer in her maternal grandfather; Colon polyps in her maternal grandmother; Crohn's disease in her maternal aunt; Depression in her mother; Diabetes in her maternal grandmother; HIV/AIDS in her father; Hypertension in her sister. Social History:  reports that she quit smoking about 5 years ago. Her smoking use included Cigarettes. She has a 4.00 pack-year smoking history. She has never used smokeless tobacco. She reports that she does not drink alcohol or use drugs.     Maternal Diabetes: No hemoglobin A1c's have been in the 5 range throughout the pregnancy Genetic Screening: Normal Maternal Ultrasounds/Referrals: Normalymmetric growth with estimated fetal weight 5-1/2 pounds approximately, less than 9% growth asymmetryetween the infant's considered clinically insignificant Fetal Ultrasounds or other Referrals:  None Maternal Substance Abuse:  No Significant Maternal Medications:   Significant Maternal Lab Results:  Lab values include: Other: normal liver function tests platelets above 300,000 so for Other Comments:  None  ROSrecently mild blood pressure elevations., Not reaching severe range today. History   Last menstrual period 05/18/2016. Exam Physical Exam  Constitutional: She appears  well-nourished.  HENT:  Head: Normocephalic and atraumatic.  Markedly prognathic jaw that is somewhat narrow will ask anesthesia to assess    Prenatal labs: ABO, Rh: B/Positive/-- (05/01 1209) Antibody: Negative (08/31 1007) Rubella: 14.00 (05/01 1209) RPR: Non Reactive (08/31 1007)  HBsAg: Negative (05/01 1209)  HIV:    GBS:not done      Assessment/Plan: Pregnancy 37w 2 d on 10/29 Twin gestation with Breech baby A Morbid  obesity Mild Preeclampsia  Plan: Scheduled for primary cesarean section on 02/03/17        Anesthesia aware.  Dr Annye Asa Attending on Monday,Will ask that  Pt be assessed at preop visit.       Will have senior surgical assistant, Dr Roselie Awkward to be there to help  The patient has been made aware that her profound obesity does present surgical challenges, that may increase her risk of complications, such a bleeding , wound infection, need for transfusion, Anesthetic complications or risk of need for general anesthesia.    Cristian Grieves V 01/31/2017, 8:38 AM

## 2017-01-31 NOTE — Telephone Encounter (Signed)
Preadmission screen Pt notified of new OR time of 0930 and arrival time of 0730.  NPO after midnight.

## 2017-01-31 NOTE — Patient Instructions (Signed)
Heather [name]  01/31/2017   Your procedure is scheduled on:  02/03/2017  Enter through the Main Entrance of [facility] up the phone at the desk and dial [phone]  Call this number if you have problems the morning of [phone]  Remember:   Do not eat food:After Midnight.  Do not drink clear liquids: 6 Hours before arrival.  Take these medicines the morning of surgery with A SIP OF WATER: take your medications as you normally do   Do not wear jewelry, make-up or nail polish.  Do not wear lotions, powders, or perfumes. Do not wear deodorant.  Do not shave 48 hours prior to surgery.  Do not bring valuables to the hospital.  [facility] is not   responsible for any belongings or valuables brought to the hospital.  Contacts, dentures or bridgework may not be worn into surgery.  Leave suitcase in the car. After surgery it may be brought to your room.  For patients admitted to the hospital, checkout time is 11:00 AM the day of              discharge.    N/A   Please read over the following fact sheets that you were given:   Surgical Site Infection Prevention

## 2017-02-01 LAB — GC/CHLAMYDIA PROBE AMP
CHLAMYDIA, DNA PROBE: NEGATIVE
Neisseria gonorrhoeae by PCR: NEGATIVE

## 2017-02-01 LAB — RPR: RPR Ser Ql: NONREACTIVE

## 2017-02-02 ENCOUNTER — Encounter: Payer: Self-pay | Admitting: Obstetrics and Gynecology

## 2017-02-02 LAB — URINE CULTURE

## 2017-02-03 ENCOUNTER — Encounter (HOSPITAL_COMMUNITY)
Admission: RE | Disposition: A | Discharge status: Home / Self Care | Payer: Self-pay | Source: Ambulatory Visit | Attending: Obstetrics and Gynecology

## 2017-02-03 ENCOUNTER — Inpatient Hospital Stay (HOSPITAL_COMMUNITY)
Admission: RE | Admit: 2017-02-03 | Discharge: 2017-02-06 | DRG: 788 | Disposition: A | Discharge status: Home / Self Care | Payer: Medicare HMO | Source: Ambulatory Visit | Attending: Obstetrics and Gynecology | Admitting: Obstetrics and Gynecology

## 2017-02-03 ENCOUNTER — Encounter (HOSPITAL_COMMUNITY): Payer: Self-pay | Admitting: *Deleted

## 2017-02-03 ENCOUNTER — Inpatient Hospital Stay (HOSPITAL_COMMUNITY): Payer: Medicare HMO | Admitting: Anesthesiology

## 2017-02-03 DIAGNOSIS — O403XX0 Polyhydramnios, third trimester, not applicable or unspecified: Secondary | ICD-10-CM | POA: Diagnosis present

## 2017-02-03 DIAGNOSIS — O1404 Mild to moderate pre-eclampsia, complicating childbirth: Principal | ICD-10-CM | POA: Diagnosis present

## 2017-02-03 DIAGNOSIS — O3093 Multiple gestation, unspecified, third trimester: Secondary | ICD-10-CM | POA: Diagnosis present

## 2017-02-03 DIAGNOSIS — K219 Gastro-esophageal reflux disease without esophagitis: Secondary | ICD-10-CM | POA: Diagnosis present

## 2017-02-03 DIAGNOSIS — Z7951 Long term (current) use of inhaled steroids: Secondary | ICD-10-CM

## 2017-02-03 DIAGNOSIS — J4530 Mild persistent asthma, uncomplicated: Secondary | ICD-10-CM | POA: Diagnosis present

## 2017-02-03 DIAGNOSIS — O99344 Other mental disorders complicating childbirth: Secondary | ICD-10-CM | POA: Diagnosis present

## 2017-02-03 DIAGNOSIS — O9962 Diseases of the digestive system complicating childbirth: Secondary | ICD-10-CM | POA: Diagnosis present

## 2017-02-03 DIAGNOSIS — O0990 Supervision of high risk pregnancy, unspecified, unspecified trimester: Secondary | ICD-10-CM

## 2017-02-03 DIAGNOSIS — O99214 Obesity complicating childbirth: Secondary | ICD-10-CM | POA: Diagnosis present

## 2017-02-03 DIAGNOSIS — Z87891 Personal history of nicotine dependence: Secondary | ICD-10-CM

## 2017-02-03 DIAGNOSIS — O9952 Diseases of the respiratory system complicating childbirth: Secondary | ICD-10-CM | POA: Diagnosis present

## 2017-02-03 DIAGNOSIS — Z91040 Latex allergy status: Secondary | ICD-10-CM | POA: Diagnosis not present

## 2017-02-03 DIAGNOSIS — O30043 Twin pregnancy, dichorionic/diamniotic, third trimester: Secondary | ICD-10-CM | POA: Diagnosis present

## 2017-02-03 DIAGNOSIS — F250 Schizoaffective disorder, bipolar type: Secondary | ICD-10-CM | POA: Diagnosis present

## 2017-02-03 DIAGNOSIS — O329XX0 Maternal care for malpresentation of fetus, unspecified, not applicable or unspecified: Secondary | ICD-10-CM

## 2017-02-03 DIAGNOSIS — O322XX2 Maternal care for transverse and oblique lie, fetus 2: Secondary | ICD-10-CM | POA: Diagnosis present

## 2017-02-03 DIAGNOSIS — O321XX1 Maternal care for breech presentation, fetus 1: Secondary | ICD-10-CM | POA: Diagnosis present

## 2017-02-03 DIAGNOSIS — Z3A37 37 weeks gestation of pregnancy: Secondary | ICD-10-CM | POA: Diagnosis not present

## 2017-02-03 DIAGNOSIS — Z880 Allergy status to penicillin: Secondary | ICD-10-CM | POA: Diagnosis not present

## 2017-02-03 DIAGNOSIS — O321XX0 Maternal care for breech presentation, not applicable or unspecified: Secondary | ICD-10-CM | POA: Diagnosis not present

## 2017-02-03 DIAGNOSIS — O1403 Mild to moderate pre-eclampsia, third trimester: Secondary | ICD-10-CM | POA: Diagnosis not present

## 2017-02-03 DIAGNOSIS — Z98891 History of uterine scar from previous surgery: Secondary | ICD-10-CM

## 2017-02-03 DIAGNOSIS — O329XX1 Maternal care for malpresentation of fetus, unspecified, fetus 1: Secondary | ICD-10-CM | POA: Diagnosis not present

## 2017-02-03 LAB — CBC
HCT: 36.1 % (ref 36.0–46.0)
HCT: 28.6 % — ABNORMAL LOW (ref 36.0–46.0)
HCT: 27.5 % — ABNORMAL LOW (ref 36.0–46.0)
Hemoglobin: 11.4 g/dL — ABNORMAL LOW (ref 12.0–15.0)
Hemoglobin: 9.2 g/dL — ABNORMAL LOW (ref 12.0–15.0)
Hemoglobin: 9 g/dL — ABNORMAL LOW (ref 12.0–15.0)
MCH: 27 pg (ref 26.0–34.0)
MCH: 27.5 pg (ref 26.0–34.0)
MCH: 28 pg (ref 26.0–34.0)
MCHC: 31.6 g/dL (ref 30.0–36.0)
MCHC: 32.2 g/dL (ref 30.0–36.0)
MCHC: 32.7 g/dL (ref 30.0–36.0)
MCV: 85.3 fL (ref 78.0–100.0)
MCV: 85.4 fL (ref 78.0–100.0)
MCV: 85.4 fL (ref 78.0–100.0)
PLATELETS: 265 10*3/uL (ref 150–400)
PLATELETS: 236 10*3/uL (ref 150–400)
PLATELETS: 239 10*3/uL (ref 150–400)
RBC: 4.23 MIL/uL (ref 3.87–5.11)
RBC: 3.35 MIL/uL — AB (ref 3.87–5.11)
RBC: 3.22 MIL/uL — ABNORMAL LOW (ref 3.87–5.11)
RDW: 18.4 % — AB (ref 11.5–15.5)
RDW: 18.2 % — AB (ref 11.5–15.5)
RDW: 18.1 % — AB (ref 11.5–15.5)
WBC: 10.3 10*3/uL (ref 4.0–10.5)
WBC: 12.7 10*3/uL — AB (ref 4.0–10.5)
WBC: 12.1 10*3/uL — ABNORMAL HIGH (ref 4.0–10.5)

## 2017-02-03 LAB — POCT I-STAT EG7
Bicarbonate: 24.9 mmol/L (ref 20.0–28.0)
CALCIUM ION: 1.2 mmol/L (ref 1.15–1.40)
HEMATOCRIT: 33 % — AB (ref 36.0–46.0)
HEMOGLOBIN: 11.2 g/dL — AB (ref 12.0–15.0)
O2 SAT: 81 %
PH VEN: 7.407 (ref 7.250–7.430)
POTASSIUM: 4 mmol/L (ref 3.5–5.1)
SODIUM: 138 mmol/L (ref 135–145)
TCO2: 26 mmol/L (ref 22–32)
pCO2, Ven: 39.5 mmHg — ABNORMAL LOW (ref 44.0–60.0)
pO2, Ven: 45 mmHg (ref 32.0–45.0)

## 2017-02-03 LAB — CREATININE, SERUM
CREATININE: 0.62 mg/dL (ref 0.44–1.00)
GFR calc non Af Amer: >60 mL/min (ref >60)

## 2017-02-03 LAB — PREPARE RBC (CROSSMATCH)

## 2017-02-03 LAB — GLUCOSE, CAPILLARY: GLUCOSE-CAPILLARY: 76 mg/dL (ref 65–99)

## 2017-02-03 SURGERY — Surgical Case
Anesthesia: Regional | Site: Abdomen | Wound class: Clean Contaminated

## 2017-02-03 MED ORDER — DIBUCAINE 1 % RE OINT: 1.0000 "application " | TOPICAL_OINTMENT | RECTAL | Status: DC | PRN

## 2017-02-03 MED ORDER — COCONUT OIL OIL: 1.0000 "application " | TOPICAL_OIL | Status: DC | PRN

## 2017-02-03 MED ORDER — SIMETHICONE 80 MG PO CHEW
80.0000 mg | CHEWABLE_TABLET | ORAL | Status: DC
  Administered 2017-02-04 – 2017-02-06 (×3): 80 mg via ORAL
  Filled 2017-02-03 (×3): qty 1

## 2017-02-03 MED ORDER — OXYTOCIN 40 UNITS IN LACTATED RINGERS INFUSION - SIMPLE MED: 2.5000 [IU]/h | INTRAVENOUS | Status: DC

## 2017-02-03 MED ORDER — MORPHINE SULFATE (PF) 0.5 MG/ML IJ SOLN
INTRAMUSCULAR | Status: AC
  Filled 2017-02-03: qty 10

## 2017-02-03 MED ORDER — WITCH HAZEL-GLYCERIN EX PADS: 1.0000 "application " | MEDICATED_PAD | CUTANEOUS | Status: DC | PRN

## 2017-02-03 MED ORDER — LACTATED RINGERS IV SOLN
INTRAVENOUS | Status: DC
  Administered 2017-02-03 (×2): via INTRAVENOUS

## 2017-02-03 MED ORDER — GENTAMICIN SULFATE 40 MG/ML IJ SOLN
INTRAVENOUS | Status: AC
  Administered 2017-02-03: 120.25 mL via INTRAVENOUS
  Filled 2017-02-03: qty 14.25

## 2017-02-03 MED ORDER — SODIUM CHLORIDE 0.9 % IR SOLN
Status: DC | PRN
  Administered 2017-02-03: 1

## 2017-02-03 MED ORDER — ALBUTEROL SULFATE HFA 108 (90 BASE) MCG/ACT IN AERS
INHALATION_SPRAY | RESPIRATORY_TRACT | Status: AC
  Filled 2017-02-03: qty 6.7

## 2017-02-03 MED ORDER — SODIUM CHLORIDE 0.9 % IV SOLN
1000.0000 mg | INTRAVENOUS | Status: AC
  Administered 2017-02-03: 1000 mg via INTRAVENOUS
  Filled 2017-02-03: qty 10

## 2017-02-03 MED ORDER — ALBUMIN HUMAN 5 % IV SOLN
INTRAVENOUS | Status: AC
  Filled 2017-02-03: qty 250

## 2017-02-03 MED ORDER — FENTANYL CITRATE (PF) 100 MCG/2ML IJ SOLN
INTRAMUSCULAR | Status: AC
  Filled 2017-02-03: qty 2

## 2017-02-03 MED ORDER — ENOXAPARIN SODIUM 60 MG/0.6ML ~~LOC~~ SOLN
60.0000 mg | Freq: Two times a day (BID) | SUBCUTANEOUS | Status: DC
  Administered 2017-02-04 – 2017-02-06 (×5): 60 mg via SUBCUTANEOUS
  Filled 2017-02-03 (×8): qty 0.6

## 2017-02-03 MED ORDER — MONTELUKAST SODIUM 10 MG PO TABS
10.0000 mg | ORAL_TABLET | Freq: Every day | ORAL | Status: DC
  Administered 2017-02-04 – 2017-02-05 (×3): 10 mg via ORAL
  Filled 2017-02-03 (×4): qty 1

## 2017-02-03 MED ORDER — OXYTOCIN 10 UNIT/ML IJ SOLN
20.0000 [IU] | Freq: Once | INTRAMUSCULAR | Status: DC
  Filled 2017-02-03: qty 2

## 2017-02-03 MED ORDER — MIDAZOLAM HCL 2 MG/2ML IJ SOLN: 0.5000 mg | Freq: Once | INTRAMUSCULAR | Status: DC | PRN

## 2017-02-03 MED ORDER — LACTATED RINGERS IV SOLN: INTRAVENOUS | Status: DC

## 2017-02-03 MED ORDER — SOD CITRATE-CITRIC ACID 500-334 MG/5ML PO SOLN
30.0000 mL | Freq: Once | ORAL | Status: AC
  Administered 2017-02-03: 30 mL via ORAL
  Filled 2017-02-03: qty 15

## 2017-02-03 MED ORDER — CEFAZOLIN SODIUM-DEXTROSE 2-3 GM-%(50ML) IV SOLR
INTRAVENOUS | Status: AC
  Filled 2017-02-03: qty 50

## 2017-02-03 MED ORDER — OXYTOCIN 10 UNIT/ML IJ SOLN
INTRAMUSCULAR | Status: AC
  Administered 2017-02-03: 10 [IU]
  Filled 2017-02-03: qty 1

## 2017-02-03 MED ORDER — LACTATED RINGERS IV SOLN
INTRAVENOUS | Status: DC | PRN
  Administered 2017-02-03: 40 [IU] via INTRAVENOUS

## 2017-02-03 MED ORDER — DIPHENHYDRAMINE HCL 25 MG PO CAPS
25.0000 mg | ORAL_CAPSULE | Freq: Four times a day (QID) | ORAL | Status: DC | PRN
  Filled 2017-02-03: qty 1

## 2017-02-03 MED ORDER — IBUPROFEN 600 MG PO TABS
600.0000 mg | ORAL_TABLET | Freq: Four times a day (QID) | ORAL | Status: DC
  Administered 2017-02-03 – 2017-02-04 (×2): 600 mg via ORAL
  Filled 2017-02-03 (×2): qty 1

## 2017-02-03 MED ORDER — ACETAMINOPHEN 500 MG PO TABS
1000.0000 mg | ORAL_TABLET | Freq: Once | ORAL | Status: AC
  Administered 2017-02-03: 1000 mg via ORAL

## 2017-02-03 MED ORDER — PROMETHAZINE HCL 25 MG/ML IJ SOLN: 6.2500 mg | INTRAMUSCULAR | Status: DC | PRN

## 2017-02-03 MED ORDER — ONDANSETRON HCL 4 MG/2ML IJ SOLN
INTRAMUSCULAR | Status: AC
  Filled 2017-02-03: qty 2

## 2017-02-03 MED ORDER — ACETAMINOPHEN 500 MG PO TABS
ORAL_TABLET | ORAL | Status: AC
  Filled 2017-02-03: qty 2

## 2017-02-03 MED ORDER — ALBUMIN HUMAN 5 % IV SOLN
INTRAVENOUS | Status: DC | PRN
  Administered 2017-02-03 (×2): via INTRAVENOUS

## 2017-02-03 MED ORDER — DIPHENHYDRAMINE HCL 25 MG PO CAPS
25.0000 mg | ORAL_CAPSULE | Freq: Every day | ORAL | Status: DC | PRN
Start: 2017-02-03 — End: 2017-02-05

## 2017-02-03 MED ORDER — ALBUTEROL SULFATE HFA 108 (90 BASE) MCG/ACT IN AERS
INHALATION_SPRAY | RESPIRATORY_TRACT | Status: DC | PRN
  Administered 2017-02-03: 2 via RESPIRATORY_TRACT

## 2017-02-03 MED ORDER — SIMETHICONE 80 MG PO CHEW
80.0000 mg | CHEWABLE_TABLET | ORAL | Status: DC | PRN
Start: 2017-02-03 — End: 2017-02-06

## 2017-02-03 MED ORDER — SCOPOLAMINE 1 MG/3DAYS TD PT72
1.0000 | MEDICATED_PATCH | Freq: Once | TRANSDERMAL | Status: DC
  Administered 2017-02-03: 1.5 mg via TRANSDERMAL
  Filled 2017-02-03: qty 1

## 2017-02-03 MED ORDER — PANTOPRAZOLE SODIUM 40 MG PO TBEC
40.0000 mg | DELAYED_RELEASE_TABLET | Freq: Every day | ORAL | Status: DC
  Administered 2017-02-04 – 2017-02-06 (×3): 40 mg via ORAL
  Filled 2017-02-03 (×3): qty 1

## 2017-02-03 MED ORDER — FENTANYL CITRATE (PF) 100 MCG/2ML IJ SOLN
INTRAMUSCULAR | Status: DC | PRN
  Administered 2017-02-03: 10 ug via INTRATHECAL

## 2017-02-03 MED ORDER — PHENYLEPHRINE 8 MG IN D5W 100 ML (0.08MG/ML) PREMIX OPTIME
INJECTION | INTRAVENOUS | Status: DC | PRN
  Administered 2017-02-03: 60 ug/min via INTRAVENOUS

## 2017-02-03 MED ORDER — TETANUS-DIPHTH-ACELL PERTUSSIS 5-2.5-18.5 LF-MCG/0.5 IM SUSP: 0.5000 mL | Freq: Once | INTRAMUSCULAR | Status: DC

## 2017-02-03 MED ORDER — PHENYLEPHRINE 8 MG IN D5W 100 ML (0.08MG/ML) PREMIX OPTIME
INJECTION | INTRAVENOUS | Status: AC
  Filled 2017-02-03: qty 100

## 2017-02-03 MED ORDER — HYDRALAZINE HCL 20 MG/ML IJ SOLN: 10.0000 mg | Freq: Once | INTRAMUSCULAR | Status: DC | PRN

## 2017-02-03 MED ORDER — LABETALOL HCL 5 MG/ML IV SOLN: 20.0000 mg | INTRAVENOUS | Status: DC | PRN

## 2017-02-03 MED ORDER — BUPROPION HCL ER (XL) 300 MG PO TB24
300.0000 mg | ORAL_TABLET | ORAL | Status: DC
  Administered 2017-02-04 – 2017-02-06 (×3): 300 mg via ORAL
  Filled 2017-02-03 (×4): qty 1

## 2017-02-03 MED ORDER — VITAMIN D3 125 MCG (5000 UT) PO CAPS: 10000.0000 [IU] | ORAL_CAPSULE | Freq: Every day | ORAL | Status: DC

## 2017-02-03 MED ORDER — MENTHOL 3 MG MT LOZG: 1.0000 | LOZENGE | OROMUCOSAL | Status: DC | PRN

## 2017-02-03 MED ORDER — MORPHINE SULFATE (PF) 4 MG/ML IV SOLN: 1.0000 mg | INTRAVENOUS | Status: DC | PRN

## 2017-02-03 MED ORDER — SENNOSIDES-DOCUSATE SODIUM 8.6-50 MG PO TABS
2.0000 | ORAL_TABLET | ORAL | Status: DC
  Administered 2017-02-04 – 2017-02-06 (×3): 2 via ORAL
  Filled 2017-02-03 (×3): qty 2

## 2017-02-03 MED ORDER — BUPIVACAINE IN DEXTROSE 0.75-8.25 % IT SOLN
INTRATHECAL | Status: AC
  Filled 2017-02-03: qty 4

## 2017-02-03 MED ORDER — SODIUM CHLORIDE 0.9 % IV SOLN: Freq: Once | INTRAVENOUS | Status: DC

## 2017-02-03 MED ORDER — SIMETHICONE 80 MG PO CHEW
80.0000 mg | CHEWABLE_TABLET | Freq: Three times a day (TID) | ORAL | Status: DC
  Administered 2017-02-03 – 2017-02-06 (×8): 80 mg via ORAL
  Filled 2017-02-03 (×7): qty 1

## 2017-02-03 MED ORDER — BUDESONIDE 0.25 MG/2ML IN SUSP
0.2500 mg | Freq: Two times a day (BID) | RESPIRATORY_TRACT | Status: DC
  Filled 2017-02-03 (×8): qty 2

## 2017-02-03 MED ORDER — ONDANSETRON HCL 4 MG/2ML IJ SOLN
INTRAMUSCULAR | Status: DC | PRN
  Administered 2017-02-03: 4 mg via INTRAVENOUS

## 2017-02-03 MED ORDER — LACTATED RINGERS IV SOLN
INTRAVENOUS | Status: DC | PRN
  Administered 2017-02-03: 11:00:00 via INTRAVENOUS

## 2017-02-03 MED ORDER — OXYTOCIN 10 UNIT/ML IJ SOLN
INTRAMUSCULAR | Status: AC
  Filled 2017-02-03: qty 4

## 2017-02-03 MED ORDER — BUPIVACAINE IN DEXTROSE 0.75-8.25 % IT SOLN
INTRATHECAL | Status: DC | PRN
  Administered 2017-02-03: 1 mL via INTRATHECAL
  Administered 2017-02-03: .2 mL via INTRATHECAL

## 2017-02-03 MED ORDER — BUSPIRONE HCL 15 MG PO TABS
15.0000 mg | ORAL_TABLET | Freq: Two times a day (BID) | ORAL | Status: DC
  Administered 2017-02-04 – 2017-02-06 (×6): 15 mg via ORAL
  Filled 2017-02-03 (×9): qty 1

## 2017-02-03 MED ORDER — PRENATAL MULTIVITAMIN CH
1.0000 | ORAL_TABLET | Freq: Every day | ORAL | Status: DC
  Administered 2017-02-04 – 2017-02-05 (×2): 1 via ORAL
  Filled 2017-02-03 (×2): qty 1

## 2017-02-03 MED ORDER — ACETAMINOPHEN 325 MG PO TABS
650.0000 mg | ORAL_TABLET | ORAL | Status: DC | PRN
  Administered 2017-02-04 (×2): 650 mg via ORAL
  Filled 2017-02-03 (×2): qty 2

## 2017-02-03 MED ORDER — MEPERIDINE HCL 25 MG/ML IJ SOLN
6.2500 mg | INTRAMUSCULAR | Status: DC | PRN
Start: 2017-02-03 — End: 2017-02-03

## 2017-02-03 MED ORDER — ZOLPIDEM TARTRATE 5 MG PO TABS: 5.0000 mg | ORAL_TABLET | Freq: Every evening | ORAL | Status: DC | PRN

## 2017-02-03 MED ORDER — ALBUTEROL SULFATE (2.5 MG/3ML) 0.083% IN NEBU
3.0000 mL | INHALATION_SOLUTION | RESPIRATORY_TRACT | Status: DC | PRN
Start: 2017-02-03 — End: 2017-02-06

## 2017-02-03 SURGICAL SUPPLY — 40 items
BENZOIN TINCTURE PRP APPL 2/3 (GAUZE/BANDAGES/DRESSINGS) ×9 IMPLANT
CHLORAPREP W/TINT 26ML (MISCELLANEOUS) ×9 IMPLANT
CLAMP CORD UMBIL (MISCELLANEOUS) IMPLANT
CLOSURE STERI STRIP 1/2 X4 (GAUZE/BANDAGES/DRESSINGS) ×3 IMPLANT
CLOSURE WOUND 1/2 X4 (GAUZE/BANDAGES/DRESSINGS)
CLOTH BEACON ORANGE TIMEOUT ST (SAFETY) ×3 IMPLANT
DRESSING DISP NPWT PICO 4X16 (MISCELLANEOUS) ×3 IMPLANT
DRSG OPSITE POSTOP 4X10 (GAUZE/BANDAGES/DRESSINGS) ×3 IMPLANT
ELECT REM PT RETURN 9FT ADLT (ELECTROSURGICAL) ×3
ELECTRODE REM PT RTRN 9FT ADLT (ELECTROSURGICAL) ×1 IMPLANT
EXTRACTOR VACUUM KIWI (MISCELLANEOUS) IMPLANT
GLOVE BIO SURGEON ST LM GN SZ9 (GLOVE) ×3 IMPLANT
GLOVE BIOGEL PI IND STRL 7.0 (GLOVE) ×1 IMPLANT
GLOVE BIOGEL PI IND STRL 9 (GLOVE) ×1 IMPLANT
GLOVE BIOGEL PI INDICATOR 7.0 (GLOVE) ×2
GLOVE BIOGEL PI INDICATOR 9 (GLOVE) ×2
GOWN STRL REUS W/TWL 2XL LVL3 (GOWN DISPOSABLE) ×3 IMPLANT
GOWN STRL REUS W/TWL LRG LVL3 (GOWN DISPOSABLE) ×3 IMPLANT
HOVERMATT SINGLE USE (MISCELLANEOUS) ×3 IMPLANT
NEEDLE HYPO 25X5/8 SAFETYGLIDE (NEEDLE) IMPLANT
NS IRRIG 1000ML POUR BTL (IV SOLUTION) ×3 IMPLANT
PACK C SECTION WH (CUSTOM PROCEDURE TRAY) ×3 IMPLANT
PAD OB MATERNITY 4.3X12.25 (PERSONAL CARE ITEMS) ×3 IMPLANT
PENCIL SMOKE EVAC W/HOLSTER (ELECTROSURGICAL) ×3 IMPLANT
RETAINER VISCERAL (MISCELLANEOUS) ×3 IMPLANT
RTRCTR C-SECT PINK 25CM LRG (MISCELLANEOUS) IMPLANT
RTRCTR C-SECT PINK 34CM XLRG (MISCELLANEOUS) ×3 IMPLANT
SPONGE LAP 18X18 X RAY DECT (DISPOSABLE) ×6 IMPLANT
STRIP CLOSURE SKIN 1/2X4 (GAUZE/BANDAGES/DRESSINGS) IMPLANT
SUT MNCRL 0 VIOLET CTX 36 (SUTURE) ×2 IMPLANT
SUT MONOCRYL 0 CTX 36 (SUTURE) ×4
SUT PDS AB 0 CTX 60 (SUTURE) ×3 IMPLANT
SUT VIC AB 0 CT1 27 (SUTURE) ×2
SUT VIC AB 0 CT1 27XBRD ANBCTR (SUTURE) ×1 IMPLANT
SUT VIC AB 2-0 CT1 27 (SUTURE) ×2
SUT VIC AB 2-0 CT1 TAPERPNT 27 (SUTURE) ×1 IMPLANT
SUT VIC AB 4-0 KS 27 (SUTURE) ×3 IMPLANT
SYR BULB IRRIGATION 50ML (SYRINGE) IMPLANT
TOWEL OR 17X24 6PK STRL BLUE (TOWEL DISPOSABLE) ×3 IMPLANT
TRAY FOLEY BAG SILVER LF 14FR (SET/KITS/TRAYS/PACK) ×3 IMPLANT

## 2017-02-03 NOTE — Transfer of Care (Signed)
Immediate Anesthesia Transfer of Care Note  Patient: Heather [name]  Procedure(s) Performed: CESAREAN SECTION MULTI-GESTATIONAL (N/A Abdomen)  Patient Location: PACU  Anesthesia Type:Spinal  Level of Consciousness: awake, alert  and oriented  Airway & Oxygen Therapy: Patient Spontanous Breathing and Patient connected to nasal cannula oxygen  Post-op Assessment: Report given to RN and Post -op Vital signs reviewed and stable  Post vital signs: Reviewed and stable  Last Vitals:  Vitals:   02/03/17 0800  BP: 138/87  Pulse: (!) 108  Resp: 18  Temp: 36.7 C    Last Pain:  Vitals:   02/03/17 0800  TempSrc: Oral  PainSc: 0-No pain         Complications: No apparent anesthesia complications

## 2017-02-03 NOTE — H&P (Signed)
Obstetric Preoperative History and Physical  Heather [name] is a 41 y.o. G2P0010 with IUP at 57w2dpresenting for primary scheduled cesarean section for di-di twins gestation.  No acute concerns.   Prenatal Course Source of Care: [facility] Pregnancy complications or risks: Patient Active Problem List   Diagnosis Date Noted  . Mild preeclampsia, third trimester 01/27/2017  . Multiple gestation with one or more fetal malpresentations in third trimester, breech A, transverse B 01/27/2017  . Gestational htn w/o significant proteinuria, third trimester 01/20/2017  . Encounter for counseling regarding contraception 01/17/2017  . Mild persistent asthma without complication 147/12/6281 . Polyhydramnios affecting pregnancy 12/31/2016  . Asymptomatic bacteriuria during pregnancy in third trimester 12/31/2016  . BMI 70 and over, adult ([place] 12/15/2016  . PUPP (pruritic urticarial papules and plaques of pregnancy) 11/05/2016  . Perennial allergic rhinitis 11/05/2016  . Mild persistent asthma with acute exacerbation 11/05/2016  . Excess weight gain in pregnancy, second trimester 10/30/2016  . Diabetes mellitus complicating pregnancy, antepartum 09/05/2016  . Supervision of high risk pregnancy, antepartum 08/06/2016  . Twin pregnancy, dichorionic/diamniotic, third trimester 08/06/2016  . AMA (advanced maternal age) multigravida 35+ 07/19/2016  . Anxiety and depression 11/07/2015  . Hard to intubate 11/07/2015  . Hypoglycemia 11/07/2015  . OSA (obstructive sleep apnea) 11/07/2015  . DM type 2 (diabetes mellitus, type 2) ([facility] 12/07/2014  . Panniculitis 12/06/2014  . Cellulitis, abdominal wall 11/11/2014  . Abdominal pain 07/14/2014  . Loose stools 07/14/2014  . Melena 01/27/2014  . Eosinophilic esophagitis 066/29/4765 . Bowel habit changes 04/28/2013  . Esophageal dysphagia 04/28/2013  . Insomnia due to mental disorder(327.02) 08/08/2011  . RLS (restless legs syndrome) 08/08/2011  .  Adjustment disorder with depressed mood 08/06/2011  . Schizoaffective disorder, bipolar type ([place] 08/02/2011    Class: Acute  . PALPITATIONS, OCCASIONAL 11/01/2009  . Bipolar disorder ([facility] 10/19/2008  . DISORDER, TOBACCO USE 08/25/2008  . Leukocytosis 07/28/2008  . ALLERGIC RHINITIS, SEASONAL 08/20/2007  . DYSMETABOLIC SYNDROME 046/50/3546 . Morbid obesity ([place] 05/13/2006  . EXTERNAL HEMORRHOIDS 05/13/2006  . HYPERLIPIDEMIA 05/12/2006  . Essential hypertension 05/12/2006  . ASTHMA 05/12/2006  . GERD (gastroesophageal reflux disease) 05/12/2006  . OSTEOARTHRITIS 05/12/2006   She plans to breastfeed, plans to bottle feed She desires Nexplanon for postpartum contraception.   Prenatal labs and studies: ABO, Rh: --/--/B POS (10/26 1110) Antibody: NEG (10/26 1057) Rubella: 14.00 (05/01 1209) RPR: Non Reactive (10/26 1120)  HBsAg: Negative (05/01 1209)  HIV:   non reactive GBS: refused 1 hr Glucola  N/a. DM2 Genetic screening normal Anatomy [place]ormal  Prenatal Transfer Tool  Maternal Diabetes: Yes:  Diabetes Type:  Pre-pregnancy, does not take medication, last hgba1c 5.6% Genetic Screening: Normal Maternal Ultrasounds/Referrals: Normal Fetal Ultrasounds or other Referrals:  None Maternal Substance Abuse:  No Significant Maternal Medications:  None Significant Maternal Lab Results: None  Past Medical History:  Diagnosis Date  . Amenorrhea   . Anxiety   . Anxiety   . Asthma   . COPD (chronic obstructive pulmonary disease) ([place]   . Depression   . Depression   . Diabetes mellitus without complication ([place]   . Dysmenorrhea   . Dysrhythmia    DR NJohnsie Cancel   . Ectopic pregnancy 2013  . Eosinophilic esophagitis    [facility]  . GERD (gastroesophageal reflux disease)    HEARTBURN   TUMS  . Hard to intubate 11/07/2015  . Leukocytosis 07/28/2008   Qualifier: Diagnosis of  By: Jonna Munro MD, Roderic Scarce    . Morbid obesity ([place])   .  Neuromuscular disorder (HCC)    RESTLESS LEG   . Obesity   . Schizoaffective disorder, bipolar type ([place])   . Sepsis ([place]) 11/11/2014  . Shortness of breath    WITH EXERTION   . Sleep apnea    CPAP    Past Surgical History:  Procedure Laterality Date  . CHOLECYSTECTOMY    . DENTAL SURGERY    . ESOPHAGOGASTRODUODENOSCOPY  May 2007   Dr. Gala Romney: Normal esophagus, stomach, D1, D2  . ESOPHAGOGASTRODUODENOSCOPY  06/16/2013   Dr. Carlton Adam, eosinophilic esophagitis, reactive gastropathy, no esophageal dilation  . TONSILLECTOMY    . TOOTH EXTRACTION  10/28/2011   Procedure: DENTAL RESTORATION/EXTRACTIONS;  Surgeon: Gae Bon, DDS;  Location: [facility] OR;  Service: Oral Surgery;;    OB History  Gravida Para Term Preterm AB Living  2 0     1    SAB TAB Ectopic Multiple Live Births  0   1        # Outcome Date GA Lbr Len/2nd Weight Sex Delivery Anes PTL Lv  2 Current           1 Ectopic 05/2011              Social History   Social History  . Marital status: Married    Spouse name: N/A  . Number of children: 0  . Years of education: N/A   Occupational History  . volunteer    Social History Main Topics  . Smoking status: Former Smoker    Packs/day: 0.50    Years: 8.00    Types: Cigarettes    Quit date: 04/25/2011  . Smokeless tobacco: Never Used  . Alcohol use No  . Drug use: No  . Sexual activity: Yes    Birth control/ protection: None     Comment: stopped smoking in jan.. had a pack the other day   Other Topics Concern  . Not on file   Social History Narrative  . No narrative on file    Family History  Problem Relation Age of Onset  . Depression Mother   . Anxiety disorder Mother   . Hypertension Sister   . Allergic rhinitis Sister   . Colon polyps Maternal Grandmother        33s  . Diabetes Maternal Grandmother   . Anxiety disorder Maternal Grandmother   . COPD Maternal Grandmother   . Crohn's disease Maternal Aunt   . Cancer Maternal Grandfather         prostate  . HIV/AIDS Father   . Liver disease Neg Hx   . Angioedema Neg Hx   . Eczema Neg Hx   . Immunodeficiency Neg Hx   . Asthma Neg Hx   . Urticaria Neg Hx     Facility-Administered Medications Prior to Admission  Medication Dose Route Frequency Provider Last Rate Last Dose  . [COMPLETED] ipratropium (ATROVENT) nebulizer solution 0.5 mg  0.5 mg Nebulization Once Gean Quint, MD   0.5 mg at 03/28/15 1725   Prescriptions Prior to Admission  Medication Sig Dispense Refill Last Dose  . acetaminophen (TYLENOL) 500 MG tablet Take 1,000 mg by mouth every 6 (six) hours as needed. pain   Taking  . albuterol (PROVENTIL HFA;VENTOLIN HFA) 108 (90 Base) MCG/ACT inhaler Inhale 2 puffs into the lungs every 4 (four) hours as needed for wheezing or shortness of breath.   Taking  . buPROPion (  WELLBUTRIN XL) 300 MG 24 hr tablet Take 300 mg by mouth every morning.   3 Taking  . busPIRone (BUSPAR) 10 MG tablet Take 15 mg by mouth 2 (two) times daily. For anxiety   Taking  . cetirizine (ZYRTEC) 10 MG tablet Take 10 mg by mouth 2 (two) times daily.   Taking  . Cholecalciferol (VITAMIN D3) 5000 units CAPS Take 10,000 Units by mouth daily.    Taking  . diphenhydrAMINE (BENADRYL) 25 MG tablet Take 25 mg by mouth daily as needed for itching.    Taking  . fluticasone (FLOVENT HFA) 110 MCG/ACT inhaler Inhale 2 puffs into the lungs 2 (two) times daily. 1 Inhaler 5 Taking  . montelukast (SINGULAIR) 10 MG tablet Take 1 tablet (10 mg total) by mouth at bedtime. 30 tablet 3 Taking  . pantoprazole (PROTONIX) 40 MG tablet TAKE 1 TABLET BY MOUTH DAILY 30 tablet 5 Taking  . Prenatal Vit-Fe Fumarate-FA (PRENATAL MULTIVITAMIN) TABS Take 1 tablet by mouth daily.    Taking  . triamcinolone ointment (KENALOG) 0.1 % Apply 1 application topically 2 (two) times daily. (Patient taking differently: Apply 1 application topically 2 (two) times daily as needed (irritation). ) 30 g 1 Taking  . vitamin E 400 UNIT capsule Take 400  Units by mouth daily.   Taking    Allergies  Allergen Reactions  . Bee Venom Shortness Of Breath  . Penicillins Anaphylaxis    Has patient had a PCN reaction causing immediate rash, facial/tongue/throat swelling, SOB or lightheadedness with hypotension: No Has patient had a PCN reaction causing severe rash involving mucus membranes or skin necrosis: No Has patient had a PCN reaction that required hospitalization No Has patient had a PCN reaction occurring within the last 10 years: No If all of the above answers are "NO", then may proceed with Cephalosporin use.'  REACTION: Angioedema  . Adhesive [Tape] Rash  . Latex Rash  . Vancomycin Other (See Comments)    Pt can tolerate Vancomycin but did cause Red-Man Syndrome.  Recommend to pre-medicate with Benadryl before doses administered.      Review of Systems: Negative except for what is mentioned in HPI.  Physical Exam: LMP 05/18/2016 (Exact Date)  CONSTITUTIONAL: Well-developed, well-nourished female in no acute distress.  HENT:  Normocephalic, atraumatic. Oropharynx is clear and moist EYES: Conjunctivae and EOM are normal. No scleral icterus.  NECK: Normal range of motion, supple SKIN: Skin is warm and dry. No rash noted. Not diaphoretic. No erythema. [place]: Alert and oriented to person, place, and time. Normal reflexes, muscle tone coordination. No cranial nerve deficit noted. PSYCHIATRIC: Normal mood and affect. Normal behavior. CARDIOVASCULAR: Normal heart rate noted, regular rhythm RESPIRATORY: Effort and breath sounds normal, no problems with respiration noted ABDOMEN: Soft, nontender, nondistended, gravid.  PELVIC: Deferred MUSCULOSKELETAL: Normal range of motion. No edema and no tenderness. 2+ distal pulses.   Pertinent Labs/Studies:   Results for orders placed or performed during the hospital encounter of 01/31/17 (from the past 72 hour(s))  Type and screen     Status: None   Collection Time: 01/31/17 10:57 AM   Result Value Ref Range   ABO/RH(D) B POS    Antibody Screen NEG    Sample Expiration 02/03/2017   ABO/Rh     Status: None   Collection Time: 01/31/17 11:10 AM  Result Value Ref Range   ABO/RH(D) B POS   CBC     Status: Abnormal   Collection Time: 01/31/17 11:20 AM  Result Value  Ref Range   WBC 10.2 4.0 - 10.5 K/uL   RBC 4.19 3.87 - 5.11 MIL/uL   Hemoglobin 11.1 (L) 12.0 - 15.0 g/dL   HCT 36.0 36.0 - 46.0 %   MCV 85.9 78.0 - 100.0 fL   MCH 26.5 26.0 - 34.0 pg   MCHC 30.8 30.0 - 36.0 g/dL   RDW 18.3 (H) 11.5 - 15.5 %   Platelets 277 150 - 400 K/uL  Comprehensive metabolic panel     Status: Abnormal   Collection Time: 01/31/17 11:20 AM  Result Value Ref Range   Sodium 132 (L) 135 - 145 mmol/L   Potassium 4.3 3.5 - 5.1 mmol/L   Chloride 98 (L) 101 - 111 mmol/L   CO2 27 22 - 32 mmol/L   Glucose, Bld 85 65 - 99 mg/dL   BUN 14 6 - 20 mg/dL   Creatinine, Ser 0.47 0.44 - 1.00 mg/dL   Calcium 9.0 8.9 - 10.3 mg/dL   Total Protein 6.9 6.5 - 8.1 g/dL   Albumin 2.7 (L) 3.5 - 5.0 g/dL   AST 16 15 - 41 U/L   ALT 13 (L) 14 - 54 U/L   Alkaline Phosphatase 131 (H) 38 - 126 U/L   Total Bilirubin 0.2 (L) 0.3 - 1.2 mg/dL   GFR calc non Af Amer >60 >60 mL/min   GFR calc Af Amer >60 >60 mL/min    Comment: (NOTE) The eGFR has been calculated using the CKD EPI equation. This calculation has not been validated in all clinical situations. eGFR's persistently <60 mL/min signify possible Chronic Kidney Disease.    Anion gap 7 5 - 15  RPR     Status: None   Collection Time: 01/31/17 11:20 AM  Result Value Ref Range   RPR Ser Ql Non Reactive Non Reactive    Comment: (NOTE) Performed At: [facility] [address] [place], [place] [id number] Lindon Romp MD IZ:[id number]   Urinalysis, Routine w reflex microscopic     Status: Abnormal   Collection Time: 01/31/17 11:20 AM  Result Value Ref Range   Color, Urine YELLOW YELLOW   APPearance CLOUDY (A) CLEAR   Specific Gravity,  Urine 1.015 1.005 - 1.030   pH 6.0 5.0 - 8.0   Glucose, UA NEGATIVE NEGATIVE mg/dL   Hgb urine dipstick MODERATE (A) NEGATIVE   Bilirubin Urine NEGATIVE NEGATIVE   Ketones, ur NEGATIVE NEGATIVE mg/dL   Protein, ur 30 (A) NEGATIVE mg/dL   Nitrite NEGATIVE NEGATIVE   Leukocytes, UA LARGE (A) NEGATIVE   RBC / HPF TOO NUMEROUS TO COUNT 0 - 5 RBC/hpf   WBC, UA TOO NUMEROUS TO COUNT 0 - 5 WBC/hpf   Bacteria, UA MANY (A) NONE SEEN   Squamous Epithelial / LPF 6-30 (A) NONE SEEN   WBC Clumps PRESENT    Mucus PRESENT    Amorphous Crystal PRESENT     Assessment and Plan :Evamaria Detore Schranz is a 41 y.o. G2P0010 at 75w2dbeing admitted for primary scheduled cesarean section for di-di twins gestation. The risks of cesarean section discussed with the patient included but were not limited to: bleeding which may require transfusion or reoperation; infection which may require antibiotics; injury to bowel, bladder, ureters or other surrounding organs; injury to the fetus; need for additional procedures including hysterectomy in the event of a life-threatening hemorrhage; placental abnormalities wth subsequent pregnancies, incisional problems, thromboembolic phenomenon and other postoperative/anesthesia complications. The patient concurred with the proposed plan, giving informed written consent for  the procedure. Patient has been NPO since last night she will remain NPO for procedure. Anesthesia and OR aware. Preoperative prophylactic antibiotics and SCDs ordered on call to the OR. To OR when ready.    Dannielle Huh, DO OB Fellow Faculty Practice, [facility]

## 2017-02-03 NOTE — Anesthesia Procedure Notes (Signed)
Spinal  Patient location during procedure: OR End time: 02/03/2017 10:38 AM Staffing Anesthesiologist: Annye Asa Performed: anesthesiologist  Preanesthetic Checklist Completed: patient identified, surgical consent, pre-op evaluation, timeout performed, IV checked, risks and benefits discussed and monitors and equipment checked Spinal Block Patient position: sitting Prep: site prepped and draped and DuraPrep Patient monitoring: heart rate, cardiac monitor, continuous pulse ox and blood pressure Approach: midline Location: L3-4 Injection technique: catheter Needle Needle type: Tuohy  Needle gauge: 18. Needle length: 15 cm Needle insertion depth: 9.5 cm Catheter type: closed end flexible Catheter size: 19 g Catheter at skin depth: 20 cm Assessment Sensory level: T5. Additional Notes Pt identified in Operating room.  Monitors applied. Working IV access confirmed. Sterile prep, drape lumbar spine.  1% lido local L 3,4.  Unable to reach Epidural space with 9 cm Touhy, Repeat local L 2,3 and long touhy with LOR 9.5 cm, CSF aspirated, so cath in easily for continuous SAB, 7.5 mg 0.75% Bupivacaine with dextrose, fentanyl injected with asp CSF beginning and end of injection.  Patient asymptomatic, VSS, no heme aspirated, tolerated well.  Jenita Seashore, MD

## 2017-02-03 NOTE — Anesthesia Postprocedure Evaluation (Signed)
Anesthesia Post Note  Patient: Heather [name]  Procedure(s) Performed: CESAREAN SECTION MULTI-GESTATIONAL (N/A Abdomen)     Patient location during evaluation: Mother Baby Anesthesia Type: Spinal Level of consciousness: awake and alert and oriented Pain management: satisfactory to patient Vital Signs Assessment: post-procedure vital signs reviewed and stable Respiratory status: spontaneous breathing and nonlabored ventilation Cardiovascular status: stable Postop Assessment: no headache, no backache, patient able to bend at knees, no signs of nausea or vomiting and adequate PO intake Anesthetic complications: no    Last Vitals:  Vitals:   02/03/17 1545 02/03/17 1700  BP: (!) 159/63 140/88  Pulse: 100 99  Resp: 18 20  Temp:  37.2 C  SpO2: 99% 98%    Last Pain:  Vitals:   02/03/17 1700  TempSrc: Oral  PainSc:    Pain Goal:                 [employer]

## 2017-02-03 NOTE — Op Note (Signed)
Please see the brief operative note for surgical details 

## 2017-02-03 NOTE — Brief Op Note (Signed)
02/03/2017  12:34 PM  PATIENT:  Heather [name]  41 y.o. female  PRE-OPERATIVE DIAGNOSIS:  Twin Gestationand mild Preeclampsa, pregnancy 37 weeks 2 days, fetal malpresentation baby A POST-OPERATIVE DIAGNOSIS:  Twin Gestationand mild Preeclampsia fetal malpresentation baby A, pregnancy 37 weeks 2 days  PROCEDURE:  Procedure(s): CESAREAN SECTION MULTI-GESTATIONAL (N/A)  SURGEON:  Surgeon(s) and Role:    * Jonnie Kind, MD - Primary    * Woodroe Mode, MD - Assisting    * Katheren Shams, DO - Fellow  PHYSICIAN ASSISTANT:   ASSISTANTS: none   ANESTHESIA:   Continue spinal  EBL:  2375 mL   BLOOD ADMINISTERED:none  DRAINS: Urinary Catheter (Foley)   LOCAL MEDICATIONS USED:  NONE  SPECIMEN:  Placenta to pathology  DISPOSITION OF SPECIMEN:  PATHOLOGY  COUNTS:  YES  TOURNIQUET:  * No tourniquets in log *  DICTATION: .Dragon Dictation  PLAN OF CARE: Admit to inpatient   PATIENT DISPOSITION:  PACU - hemodynamically stable.   Delay start of Pharmacological VTE agent (>24hrs) due to surgical blood loss or risk of bleeding: not applicable patient received Lysteda 1 g IV at start of case   Details of procedure: Patient was taken operating room, with Foley catheter having been placed. The patient was positioned carefully and then spinal epidural analgesia attempted. It was converted to the continuous spinal with catheter threaded as CSF obtained. See Dr. Glenice Laine notes for details. The patient then was positioned on the table with table expanders position under the patient. The abdomen was prepped and draped. Prior to this I palpated the symphysis pubis  through mark on the abdomen above the umbilicus about 10 cm which corresponded to the position of the symphysis pubis underneath the pannus. Midline was marked to the xiphoid. After timeout was conducted and procedure confirmed including antibiotic administration and Lysteda, tranexamic acid administered 1 g,  midline vertical incision was made down to the fascia which was opened in the midline and immediately the uterus was encountered. The incision was extended inferiorly and superiorly approximately distance 20 cm To the uterus. The uterus was rotated approximately 75 to the patient's right we were looking at the left round ligament insertion, fallopian tube and utero-ovarian ligament insertion in the midline. Uterus was rotated back into its more proper anatomic position and the lower uterine segment identified. It was assessed sufficiently that we could not access the lower uterine segment for a transverse incision so a lower uterine segment vertical incision was used to open the uterus in the midline. A was delivered from a breech position, legs flexed arms swept from the head and the baby's head delivered easily. Cord was clamped at 1 minute and the baby passed to the waiting pediatricians. Second amniotic bag was broken revealing generous clear amniotic fluid and baby B delivered vertex presentation  Cord was clamped 30-60 seconds this baby was not breathing as well as baby A. Pediatricians received care at this point. Placenta delivered required manual extraction eventuated about 5 minutes. There was large vein on the right side of the lower uterine segment that was identified and doubly clamped and dramatically improved hemostasis. An tone was good. Uterus could be exteriorized. Swabbed out carefully,, and then closed in a continuous running 2 layer closure of 0 Monocryl with good hemostasis. Abdomen was irrigated and and then the anterior peritoneum and fascia closed with 0 PDS continuous.  running closure. Subcutaneous fatty tissue was closed with running 2-0 Vicryl 2 separate layers, then  continuous running 4-0 Vicryl closure the skin completed the procedure patient tolerated procedure amazingly well with estimated blood loss of between 1500 cc and 2000 cc she never showed any signs of  hypotension. Patient was then transferred from the exam table and will be placed on Lovenox prophylaxis at 12 hours postsurgical

## 2017-02-03 NOTE — Anesthesia Postprocedure Evaluation (Signed)
Anesthesia Post Note  Patient: Heather [name]  Procedure(s) Performed: CESAREAN SECTION MULTI-GESTATIONAL (N/A Abdomen)     Patient location during evaluation: PACU Anesthesia Type: Spinal (intrathecal catheter) Level of consciousness: awake and alert, patient cooperative and oriented Pain management: pain level controlled Vital Signs Assessment: post-procedure vital signs reviewed and stable Respiratory status: spontaneous breathing, nonlabored ventilation, respiratory function stable and patient connected to nasal cannula oxygen Cardiovascular status: blood pressure returned to baseline and stable Postop Assessment: no backache, spinal receding, patient able to bend at knees and no apparent nausea or vomiting (pt c/o headache) Anesthetic complications: yes (PDP H/A) Comments: 13:05 Intrathecal catheter removed, tip intact     Last Vitals:  Vitals:   02/03/17 1230 02/03/17 1245  BP: (!) 144/78 140/73  Pulse: 95 89  Resp: (!) 21 15  Temp: 36.6 C   SpO2:  98%    Last Pain:  Vitals:   02/03/17 0800  TempSrc: Oral  PainSc: 0-No pain   Pain Goal:                 Giavana Rooke,E. Harlene Petralia

## 2017-02-03 NOTE — H&P (Signed)
Heather [name] is a 41 y.o. femaleG2P0010  at [redacted]w[redacted]d presenting for primary cesarean section for malpresentation Baby A, breech, and Baby B transverse.. [facility], with late pregnancy notable for mild proteinuria , 693 mg/d, on 24 hr urine collection. She has had normal PIH labs except for proteinuria, and did not have a baseline proteinuria tested in early pregnancy.  She has had symmetric growth by u/s of the infants, both over 5+8, 9% descrepancy. OB History    Gravida Para Term Preterm AB Living   2 0     1     SAB TAB Ectopic Multiple Live Births   0   1         Past Medical History:  Diagnosis Date  . Amenorrhea   . Anxiety   . Anxiety   . Asthma   . COPD (chronic obstructive pulmonary disease) ([place])   . Depression   . Depression   . Diabetes mellitus without complication ([place])   . Dysmenorrhea   . Dysrhythmia    DR Johnsie Cancel    . Ectopic pregnancy 2013  . Eosinophilic esophagitis    Diagnosed at [facility] 06/16/2013, untreated  . GERD (gastroesophageal reflux disease)    HEARTBURN   TUMS  . Hard to intubate 11/07/2015  . Leukocytosis 07/28/2008   Qualifier: Diagnosis of  By: Jonna Munro MD, Roderic Scarce    . Morbid obesity ([place])   . Neuromuscular disorder (HCC)    RESTLESS LEG   . Obesity   . Schizoaffective disorder, bipolar type ([place])   . Sepsis ([place]) 11/11/2014  . Shortness of breath    WITH EXERTION   . Sleep apnea    CPAP   Past Surgical History:  Procedure Laterality Date  . CHOLECYSTECTOMY    . DENTAL SURGERY    . ESOPHAGOGASTRODUODENOSCOPY  May 2007   Dr. Gala Romney: Normal esophagus, stomach, D1, D2  . ESOPHAGOGASTRODUODENOSCOPY  06/16/2013   Dr. Carlton Adam, eosinophilic esophagitis, reactive gastropathy, no esophageal dilation  . TONSILLECTOMY    . TOOTH EXTRACTION  10/28/2011   Procedure: DENTAL RESTORATION/EXTRACTIONS;  Surgeon: Gae Bon, DDS;  [facility];  Service: Oral Surgery;;   Family History: family history includes Allergic  rhinitis in her sister; Anxiety disorder in her maternal grandmother and mother; COPD in her maternal grandmother; Cancer in her maternal grandfather; Colon polyps in her maternal grandmother; Crohn's disease in her maternal aunt; Depression in her mother; Diabetes in her maternal grandmother; HIV/AIDS in her father; Hypertension in her sister. Social History:  reports that she quit smoking about 5 years ago. Her smoking use included Cigarettes. She has a 4.00 pack-year smoking history. She has never used smokeless tobacco. She reports that she does not drink alcohol or use drugs.     Maternal Diabetes: No Genetic Screening: Normal Maternal Ultrasounds/Referrals: Normal  Growth symmetry within 9 % Fetal Ultrasounds or other Referrals:  None Maternal Substance Abuse:  No Significant Maternal Medications:  None Significant Maternal Lab Results:  Lab values include: Other:  no GBS noted on record Other Comments:  None  ROS History   Blood pressure 138/87, pulse (!) 108, temperature 98.1 F (36.7 C), temperature source Oral, resp. rate 18, height 5\' 3"  (1.6 m), weight (!) 457 lb (207.3 kg), last menstrual period 05/18/2016. Exam Physical Exam  Constitutional: She appears well-developed and well-nourished.  Morbidly obese  HENT:  Head: Normocephalic and atraumatic.  Eyes: Pupils are equal, round, and reactive to light.  Neck: Normal  range of motion.  Cardiovascular: Normal rate and regular rhythm.   Respiratory: Effort normal.  GI: Soft.  Genitourinary: Vagina normal.    Prenatal labs: ABO, Rh: --/--/B POS (10/26 1110) Antibody: NEG (10/26 1057) Rubella: 14.00 (05/01 1209) RPR: Non Reactive (10/26 1120)  HBsAg: Negative (05/01 1209)  HIV:   negative in early pregnancy GBS:   not recorded  Assessment/Plan: Pregnancy 37w2 days Twin gestation, fetal malpresentation x 2 breech A transverse B Preeclampsia , mild ,based on BP's and proteinuria, but normal LFT and  platelets.   Primary low transverse cesarean.thru a supraumbilical vertical incision Will give Lysteda 1 gm at start of case. Pharmacy notified Post op DVT prophylaxis to follow bariatric dosing of 60 mg sq q 12 h., to begin at 12 h pp.  Ancef 3 gm IV Will recheck platets this a.m. Dawn Kiper V 02/03/2017, 9:10 AM

## 2017-02-03 NOTE — Anesthesia Preprocedure Evaluation (Addendum)
Anesthesia Evaluation  Patient identified by MRN, date of birth, ID band Patient awake    Reviewed: Allergy & Precautions, NPO status , Patient's Chart, lab work & pertinent test results  History of Anesthesia Complications Negative for: history of anesthetic complications  Airway Mallampati: II  TM Distance: >3 FB Neck ROM: Full    Dental  (+) Edentulous Upper, Edentulous Lower   Pulmonary asthma , sleep apnea (no CPAP) , COPD (daily inhalers),  COPD inhaler, former smoker,    breath sounds clear to auscultation       Cardiovascular hypertension (mild pre-eclampsia),  Rhythm:Regular Rate:Normal     Neuro/Psych PSYCHIATRIC DISORDERS Anxiety Depression Bipolar Disorder negative neurological ROS     GI/Hepatic GERD  Medicated,Elevated LFTs   Endo/Other  diabetes (glu 76)Morbid obesity  Renal/GU negative Renal ROS     Musculoskeletal   Abdominal (+) + obese,   Peds  Hematology negative hematology ROS (+)   Anesthesia Other Findings   Reproductive/Obstetrics (+) Pregnancy (twins)                            Anesthesia Physical Anesthesia Plan  ASA: III  Anesthesia Plan: Combined Spinal and Epidural   Post-op Pain Management:    Induction:   PONV Risk Score and Plan: 2 and Ondansetron and Scopolamine patch - Pre-op  Airway Management Planned: Natural Airway and Simple Face Mask  Additional Equipment:   Intra-op Plan:   Post-operative Plan:   Informed Consent: I have reviewed the patients History and Physical, chart, labs and discussed the procedure including the risks, benefits and alternatives for the proposed anesthesia with the patient or authorized representative who has indicated his/her understanding and acceptance.     Plan Discussed with: CRNA and Surgeon  Anesthesia Plan Comments: (Plan routine monitors, CSE)        Anesthesia Quick Evaluation

## 2017-02-03 NOTE — Progress Notes (Signed)
Dr. Manus Rudd notified of patient's BP of 159/63. Patient had just had conversation with neonatologist regarding  status of Twin B in NICU and was tearful.  Vaginal bleeding post C-section is moderate amt without clots. Pad 75% saturated in 1 hour.  Difficult to palpate fundus to determine firmness. No orders to continue pitocin infusion at this time.Will monitor closely and give MD update in 1 hour.   MD also discontinued Vitamin D order since dose taken by patient not available in pharmacy

## 2017-02-03 NOTE — Progress Notes (Addendum)
#  PP Bleeding:  Went to evaluate patient for heavy PP bleeding. EBL during surgery 2375 mL. Unknown PP bleeding loss but believe ~211mL since I have examined patient. -Lower uterine segment cleared of clots -Will give IM pitcocin dose of 20U; if bleeding not improved give methergine -Will check CBC at 7p  #Low grade temp: Patient warm to palpation with Temp of 63F will need to monitor closely for infection High infection risk  #Elevated BPs: Primary c/s due to mild preE. No severe range pressures.  -continue to monitor BPs -hold off on antihypertensives at this time but if continue elevation >140/90 start BP medication   Luiz Blare, DO OB Fellow

## 2017-02-03 NOTE — Addendum Note (Signed)
Addendum  created 02/03/17 1822 by Jonna Munro, CRNA   Sign clinical note

## 2017-02-04 LAB — GLUCOSE, CAPILLARY: Glucose-Capillary: 85 mg/dL (ref 65–99)

## 2017-02-04 MED ORDER — ENOXAPARIN SODIUM 150 MG/ML ~~LOC~~ SOLN: 1.0000 mg/kg | SUBCUTANEOUS | Status: DC

## 2017-02-04 MED ORDER — OXYCODONE-ACETAMINOPHEN 5-325 MG PO TABS
1.0000 | ORAL_TABLET | Freq: Four times a day (QID) | ORAL | Status: DC | PRN
  Administered 2017-02-04 – 2017-02-05 (×3): 1 via ORAL
  Filled 2017-02-04 (×3): qty 1

## 2017-02-04 MED ORDER — FERROUS SULFATE 325 (65 FE) MG PO TABS
325.0000 mg | ORAL_TABLET | Freq: Two times a day (BID) | ORAL | Status: DC
  Administered 2017-02-04 – 2017-02-06 (×5): 325 mg via ORAL
  Filled 2017-02-04 (×6): qty 1

## 2017-02-04 NOTE — Progress Notes (Signed)
Called resident to ask for pain medications, he stated they are in the middle of a procedure and get orders as soon as he can.

## 2017-02-04 NOTE — Plan of Care (Signed)
Problem: Activity: Goal: Will verbalize the importance of balancing activity with adequate rest periods Outcome: Completed/Met Date Met: 02/04/17 Patient ambulated to NICU. Wheelchair also provided. Rest periods encouraged.

## 2017-02-04 NOTE — Progress Notes (Signed)
Called resident, patient requesting pain medication.

## 2017-02-04 NOTE — Clinical Social Work Maternal (Signed)
CLINICAL SOCIAL WORK MATERNAL/CHILD NOTE  Patient Details  Name: Heather [name] MRN: [id number] Date of Birth: 11/17/1975  Date:  02/04/2017  Clinical Social Worker Initiating Note:  Talbot Monarch Boyd-Gilyard Date/Time: Initiated:  02/04/17/1438     Child's Name:  Twins: Heather [name] and Heather [name]   Biological Parents:  Mother, Father   Need for Interpreter:  None   Reason for Referral:  Behavioral Health Concerns, Other (Comment) (TwinB NICU admission. )   Address:  [address] [place] [address]    Phone number:  [phone] (home)     Additional phone number:   Household Members/Support Persons (HM/SP):   Household Member/Support Person 1   HM/SP Name Relationship DOB or Age  HM/SP -1 William Campa Husband 03/14/1981  HM/SP -2        HM/SP -3        HM/SP -4        HM/SP -5        HM/SP -6        HM/SP -7        HM/SP -8          Natural Supports (not living in the home):  Parent, Church   Professional Supports: None (CSW made referral to [place] [facility])   Employment: Part-time   Type of Work: Pre School Teacher   Education:  Vocation/technical training   Homebound arranged:    Financial Resources:  Medicaid, Medicare , SSI/Disability   Other Resources:  Food Stamps , WIC   Cultural/Religious Considerations Which May Impact Care:  Per MOB's Face Sheet, MOB is [employer].   Strengths:  Ability to meet basic needs , Compliance with medical plan , Home prepared for child , Understanding of illness, Psychotropic Medications   Psychotropic Medications:  Buspar, Wellbutrin      Pediatrician:       Pediatrician List:       [place]    [place]    [place]    [place]    [place]      Pediatrician Fax Number:    Risk Factors/Current Problems:  Mental Health Concerns    Cognitive State:  Able to Concentrate , Alert , Linear Thinking    Mood/Affect:  Comfortable , Calm ,  Interested , Flat    CSW Assessment: CSW met with MOB to complete an assessment for MH hx and NICU admission of twinB.  When CSW arrived, MOB was resting on the couch, FOB was sitting in the recliner, and infant was asleep in the bassinet.  MOB gave CSW permission to meet with MOB while FOB was present.   CSW asked about MOB thoughts and feeling regarding giving birth and having a baby admitted to the NICU.  MOB communicated "I am beyond happy, and everyone did not think that I was going to be able to have children."  MOB reported that MOB's pregnancy was not planned, and stated "I was actually shocked when my home pregnancy test was positive and then to find out a few days later, I was going to be having twins."  MOB continuously mentioned MOB's traumatic history, but was not interested in elaborating or sharing MOB's story. MOB disclosed that MOB currently has a dx of anxiety and depression and is currently receiving medication management at [facility] (Dr. Carlie Enders). CSW praised MOB for receiving outpatient services and encouraged MOB to continue.  MOB expressed that MOB no longer has the dx of Bipolar disorder, Schizoaffective   disorder, and adjustment disorder.  Per MOB, the above diagnosis were incorrect.  MOB's next scheduled appointment with MOB's psychiatrist is February 12, 2017.  CSW assessed for safety and MOB denied SI and HI.  MOB presented with insight and awareness and did not present with any acute signs or symptoms. CSW provided education regarding the baby blues period vs. perinatal mood disorders, discussed treatment and gave resources for mental health follow up if concerns arise.  CSW recommends self-evaluation during the postpartum time period using the New Mom Checklist from Postpartum Progress and encouraged MOB to contact a medical professional if symptoms are noted at any time.    MOB reports having all necessary items for infant and feeling prepared to parent.  MOB denied  barriers to visiting with infant if MOB and TwinA discharges before TwinB.  CSW provided review of Sudden Infant Death Syndrome (SIDS) precautions.  CSW will continue to provide resources and supports while infant remains in NICU.  A referral was made to [place] [facility] Program.  CSW Plan/Description:  Psychosocial Support and Ongoing Assessment of Needs, Other Patient/Family Education, Perinatal Mood and Anxiety Disorder (PMADs) Education, Other Information/Referral to Community Resources, Sudden Infant Death Syndrome (SIDS) Education   Huxley Vanwagoner Boyd-Gilyard, MSW, LCSW Clinical Social Work [phone]  Imogen Maddalena D BOYD-GILYARD, LCSW 02/04/2017, 2:43 PM 

## 2017-02-04 NOTE — Progress Notes (Signed)
Called resident for patient's ibuprofen order, she stated she will look into it and let mw know.

## 2017-02-04 NOTE — Lactation Note (Signed)
This note was copied from a baby's chart. Lactation Consultation Note  Patient Name: Heather [name] Genet Today's Date: 02/04/2017   NICU baby 73 hours old. Notified Mickel Baas, RN that mom on Wellbrutrin and Buspar, both L-3s according to [facility], "Medications & Mothers' Milk."  Maternal Data    Feeding    LATCH Score                   Interventions    Lactation Tools Discussed/Used     Consult Status      [name] 02/04/2017, 3:15 PM

## 2017-02-04 NOTE — Progress Notes (Signed)
POSTPARTUM NOTE  Post Partum Day 1/ Post-op day 1  Subjective:  Heather [name] is a 41 y.o. G2P1010 s/p pLVCS at [redacted]w[redacted]d 2/2 di-di twins.  She report feeling light headed and with a headache over night. But reports that she feels much better today.  Pt denies problems with ambulating, voiding or po intake.  She denies nausea or vomiting.  Pain is moderately controlled.  She has not had flatus. She has not had bowel movement. But she reports belching.  Lochia Small at this time  Objective: Blood pressure 131/69, pulse 88, temperature 98.7 F (37.1 C), temperature source Oral, resp. rate 20, height 5\' 3"  (1.6 m), weight (!) 457 lb (207.3 kg), last menstrual period 05/18/2016, SpO2 98 %, unknown if currently breastfeeding.  Physical Exam:  General: alert, cooperative and no distress Chest: no respiratory distress Heart:regular rate, distal pulses intact Abdomen: soft, nontender,  Uterine Fundus: firm, appropriately tender DVT Evaluation: No calf swelling or tenderness Extremities: mild edema Skin: warm, dry, incision with vacuum dressing. Clean    Recent Labs  02/03/17 1455 02/03/17 1854  HGB 9.2* 9.0*  HCT 28.6* 27.5*    Assessment/Plan: Heather [name] is a 40 y.o. G2P1010 s/p pLVCS 2/2 di-di twins at [redacted]w[redacted]d   PPD# 1- Doing well Contraception: Nexplanon Feeding: Breast Dispo: Plan for discharge tomorrow. Patient placed on Lovenox prophylaxis     LOS: 1 day   [place] 02/04/2017, 8:53 AM

## 2017-02-04 NOTE — Lactation Note (Signed)
This note was copied from a baby's chart. Lactation Consultation Note  Patient Name: Heather [name] Today's Date: 02/04/2017 Reason for consult: Initial assessment  Baby 76 hours old. Mom reports that baby does not nurse for more than a few minutes. Assisted mom with latching the baby to right breast in cross-cradle position, and baby attempted to latch several times but could not maintain a deep latch. Attempted to latch baby in football position and expressed a drop of colostrum and baby latched for a few minutes, suckling off-and-on. Baby sleepy at breast. Enc mom to use DEBP and she was able to collect 2 ml of EBM. Assisted parents to finger and syringe feed 2 ml of EBM and 5 ml of Alimentum. Mom reports that she intended to give EBM and formula when she came to the hospital. Discussed progression of milk coming to volume and supply and demand.   Plan is for mom to put baby to breast with cues and at least every 3 hours. Enc supplementing with EBM/formula according to guidelines, which were given with review. Enc mom to post-pump followed by hand expression. Mom reports that she is active with [place], [place] office--so enc mom to call and discuss getting a DEBP.   Mom taking Wellbutrin and Buspar, both L-3s according to [facility], "Medications & Mothers' Milk."  Discussed medications with mom and that Wellbutrin "may suppress milk production," per Walker Kehr as well. Discussed medications with Dr. Jimmye Norman, [facility]. Also discussed with mom that her EBL of 2400 mls could impact her breast milk supply. Discussed assessment and interventions with Janett Billow, RN.   Maternal Data Has patient been taught Hand Expression?: Yes Does the patient have breastfeeding experience prior to this delivery?: No  Feeding Feeding Type: Breast Fed Length of feed: 3 min  LATCH Score Latch: Repeated attempts needed to sustain latch, nipple held in mouth throughout feeding, stimulation needed to elicit  sucking reflex.  Audible Swallowing: None  Type of Nipple: Everted at rest and after stimulation  Comfort (Breast/Nipple): Soft / non-tender  Hold (Positioning): Assistance needed to correctly position infant at breast and maintain latch.  LATCH Score: 6  Interventions Interventions: Breast feeding basics reviewed;Assisted with latch;Skin to skin;Hand express;Breast compression;Adjust position;Support pillows;Position options;Expressed milk  Lactation Tools Discussed/Used Tools: Pump Breast pump type: Double-Electric Breast Pump Pump Review: Setup, frequency, and cleaning   Consult Status Consult Status: Follow-up Date: 02/05/17 Follow-up type: In-patient    [name] 02/04/2017, 2:23 PM

## 2017-02-05 LAB — CBC
HEMATOCRIT: 29.4 % — AB (ref 36.0–46.0)
HEMOGLOBIN: 9.3 g/dL — AB (ref 12.0–15.0)
MCH: 27.2 pg (ref 26.0–34.0)
MCHC: 31.6 g/dL (ref 30.0–36.0)
MCV: 86 fL (ref 78.0–100.0)
Platelets: 304 10*3/uL (ref 150–400)
RBC: 3.42 MIL/uL — AB (ref 3.87–5.11)
RDW: 18.7 % — ABNORMAL HIGH (ref 11.5–15.5)
WBC: 14 10*3/uL — ABNORMAL HIGH (ref 4.0–10.5)

## 2017-02-05 MED ORDER — AMLODIPINE BESYLATE 5 MG PO TABS
5.0000 mg | ORAL_TABLET | Freq: Every day | ORAL | Status: DC
  Administered 2017-02-05: 5 mg via ORAL
  Filled 2017-02-05: qty 1

## 2017-02-05 MED ORDER — AMLODIPINE BESYLATE 10 MG PO TABS
10.0000 mg | ORAL_TABLET | Freq: Every day | ORAL | Status: DC
  Administered 2017-02-06: 10 mg via ORAL
  Filled 2017-02-05 (×2): qty 1

## 2017-02-05 NOTE — Progress Notes (Signed)
POSTPARTUM PROGRESS NOTE  Post Partum Day 2/ Post Op day 2  Subjective:  Heather [name] is a 41 y.o. G2P1010 s/p pLVCS at [redacted]w[redacted]d.  She states that overnight she had a "lost of fluid including blood" when she used the restroom and since then has felt "lighter." She states that in general she feels better today than she did last night.  Pt denies problems with ambulating, voiding or po intake. She denies headaches, blurry vision or RUQ pain.  She denies nausea or vomiting.  Pain is well controlled.  She has had flatus. She has not had bowel movement.  Lochia Small.   Objective: Blood pressure (!) 159/88, pulse 96, temperature 98.1 F (36.7 C), temperature source Oral, resp. rate 19, height 5\' 3"  (1.6 m), weight (!) 457 lb (207.3 kg), last menstrual period 05/18/2016, SpO2 96 %, unknown if currently breastfeeding.  Physical Exam:  General: alert, cooperative and no distress Chest: no respiratory distress Heart:regular rate, distal pulses intact Abdomen: soft, nontender,  Uterine Fundus: firm, appropriately tender DVT Evaluation: No calf swelling or tenderness Extremities: has bilateral peripheral edema Skin: Vertical (abdominal) PICO in place. Clean and dry. No signs of inflammation surrounding dressing.   Recent Labs  02/03/17 1854 02/05/17 0810  HGB 9.0* 9.3*  HCT 27.5* 29.4*    Assessment/Plan: Heather [name] is a 41 y.o. G2P1010 s/p pLVCS 2/2 di-di twins at [redacted]w[redacted]d    PPD#2 - Doing well Contraception: nexplanon Feeding: breast Dispo: Patient will be keep one more day for observation. Plan for discharge tomorrow.  #HTN: BP elevated at 159/88 will increase Norvasc 5mg  >10mg .   LOS: 2 days   Ebony Cargo MinottMD 02/05/2017, 9:04 AM

## 2017-02-06 ENCOUNTER — Ambulatory Visit: Payer: Self-pay

## 2017-02-06 MED ORDER — OXYCODONE-ACETAMINOPHEN 5-325 MG PO TABS: 1.0000 | ORAL_TABLET | Freq: Four times a day (QID) | ORAL | 0 refills | Status: DC | PRN

## 2017-02-06 MED ORDER — AMLODIPINE BESYLATE 10 MG PO TABS: 10.0000 mg | ORAL_TABLET | Freq: Every day | ORAL | 0 refills | Status: DC

## 2017-02-06 MED ORDER — ENOXAPARIN SODIUM 60 MG/0.6ML ~~LOC~~ SOLN: 60.0000 mg | Freq: Two times a day (BID) | SUBCUTANEOUS | 0 refills | Status: DC

## 2017-02-06 MED ORDER — SENNOSIDES-DOCUSATE SODIUM 8.6-50 MG PO TABS: 2.0000 | ORAL_TABLET | ORAL | 0 refills | Status: DC

## 2017-02-06 MED ORDER — TRIAMCINOLONE ACETONIDE 0.1 % EX OINT: 1.0000 "application " | TOPICAL_OINTMENT | Freq: Two times a day (BID) | CUTANEOUS | 0 refills | Status: DC

## 2017-02-06 NOTE — Lactation Note (Signed)
This note was copied from a baby's chart. Lactation Consultation Note: Mother has been bottle feeding. She reports that she tried to breast feed twice yesterday. She has only pumped breast several times,. Mother unsure of her commitment to breastfeeding. Lots of teaching about supply and demand. Mother informed of milk coming in on day 3-4. Advised mother in pumping every 2-3 hours if she desires to offer breastmilk in a bottle. Mother reports she has a pump at home.  Mother advised in treatment and prevention of engorgement. Discussed drying milk if she chooses to bottle feed formula only.  Reviewed NICU brochure on collection, storage and transporting expressed breastmilk. Mother of patient at the bedside and has understanding of all teaching. Mother aware of available [facility] services. She has [facility] brochure with [facility] number to call as needed.   Patient Name: Heather [name] Today's Date: 02/06/2017     Maternal Data    Feeding    LATCH Score                   Interventions    Lactation Tools Discussed/Used     Consult Status      Darla Lesches 02/06/2017, 3:58 PM

## 2017-02-06 NOTE — Discharge Summary (Signed)
OB Discharge Summary     Patient Name: Heather [name] DOB: 05-Jul-1975 MRN: [id number]  Date of admission: 02/03/2017 Delivering MD:    Solan, Girl SARINITY DICICCO [[id number]]  Mallory Shirk V   Whisenant, Ted Mcalpine [[id number]]  Jonnie Kind   Date of discharge: 02/06/2017  Admitting diagnosis: Twin Gestation BreechTransverse, Pre-eclampsia Intrauterine pregnancy: [redacted]w[redacted]d     Secondary diagnosis:  Active Problems:   Twin pregnancy, dichorionic/diamniotic, third trimester   Multiple gestation with one or more fetal malpresentations in third trimester, breech A, transverse B   Status post primary low transverse cesarean section  Additional problems: Patient Active Problem List   Diagnosis Date Noted  . Status post primary low transverse cesarean section 02/03/2017  . Mild preeclampsia, third trimester 01/27/2017  . Multiple gestation with one or more fetal malpresentations in third trimester, breech A, transverse B 01/27/2017  . Gestational htn w/o significant proteinuria, third trimester 01/20/2017  . Encounter for counseling regarding contraception 01/17/2017  . Mild persistent asthma without complication 56/38/7564  . Polyhydramnios affecting pregnancy 12/31/2016  . Asymptomatic bacteriuria during pregnancy in third trimester 12/31/2016  . BMI 70 and over, adult ([place]) 12/15/2016  . PUPP (pruritic urticarial papules and plaques of pregnancy) 11/05/2016  . Perennial allergic rhinitis 11/05/2016  . Mild persistent asthma with acute exacerbation 11/05/2016  . Excess weight gain in pregnancy, second trimester 10/30/2016  . Diabetes mellitus complicating pregnancy, antepartum 09/05/2016  . Supervision of high risk pregnancy, antepartum 08/06/2016  . Twin pregnancy, dichorionic/diamniotic, third trimester 08/06/2016  . AMA (advanced maternal age) multigravida 35+ 07/19/2016  . Anxiety and depression 11/07/2015  . Hard to intubate 11/07/2015  . Hypoglycemia 11/07/2015  . OSA  (obstructive sleep apnea) 11/07/2015  . DM type 2 (diabetes mellitus, type 2) ([place]) 12/07/2014  . Panniculitis 12/06/2014  . Cellulitis, abdominal wall 11/11/2014  . Abdominal pain 07/14/2014  . Loose stools 07/14/2014  . Melena 01/27/2014  . Eosinophilic esophagitis 33/29/5188  . Bowel habit changes 04/28/2013  . Esophageal dysphagia 04/28/2013  . Insomnia due to mental disorder(327.02) 08/08/2011  . RLS (restless legs syndrome) 08/08/2011  . Adjustment disorder with depressed mood 08/06/2011  . Schizoaffective disorder, bipolar type ([place]) 08/02/2011    Class: Acute  . PALPITATIONS, OCCASIONAL 11/01/2009  . Bipolar disorder ([place]) 10/19/2008  . DISORDER, TOBACCO USE 08/25/2008  . Leukocytosis 07/28/2008  . ALLERGIC RHINITIS, SEASONAL 08/20/2007  . DYSMETABOLIC SYNDROME 41/66/0630  . Morbid obesity ([place]) 05/13/2006  . EXTERNAL HEMORRHOIDS 05/13/2006  . HYPERLIPIDEMIA 05/12/2006  . Essential hypertension 05/12/2006  . ASTHMA 05/12/2006  . GERD (gastroesophageal reflux disease) 05/12/2006  . OSTEOARTHRITIS 05/12/2006      Discharge diagnosis: Term Pregnancy Delivered, Preeclampsia (mild) and Type 2 DM                                                                                                Post partum procedures:none  Augmentation: none  Complications: [id number]  Hospital course:  Sceduled C/S   41 y.o. yo G2P1010 at [redacted]w[redacted]d was admitted to the hospital 02/03/2017 for scheduled cesarean section with the following indication:Malpresentation and  Multifetal Gestation.  Membrane Rupture Time/Date:    Bies, Girl KRISY DIX [[id number]]  11:14 AM   Auxier, DELORIA BRASSFIELD [[id number]]  11:14 AM ,   [place], Girl ELAIZA SHOBERG [[id number]]  02/03/2017   Mcmains, MAYOLA MCBAIN [[id number]]  02/03/2017   Patient delivered a Viable infant.   Akhter, Girl AMIYLAH ANASTOS [[id number]]  02/03/2017   Piggott, LOTOYA CASELLA [[id number]]  02/03/2017  Details of operation can be found in  separate operative note. Pt did have an EBL of 2375cc but remained hemodynamically stable. Pateint had a postpartum course remarkable for 1) Lovenox 60 q 12h and 2) being started on Norvasc 10mg  due to elevated BPs. Her CBGs were within nl range without medication.  She is ambulating, tolerating a regular diet, passing flatus, and urinating well. Patient is discharged home in stable condition on  02/06/17         Physical exam  Vitals:   02/05/17 0517 02/05/17 1126 02/05/17 1755 02/06/17 0612  BP: (!) 159/88 134/70 (!) 143/89 135/80  Pulse: 96 (!) 102 (!) 102 100  Resp: 19 20 20 18   Temp: 98.1 F (36.7 C)  98.3 F (36.8 C)   TempSrc: Oral  Oral   SpO2: 96%     Weight:      Height:       General: alert, cooperative and no distress Lochia: appropriate Uterine Fundus: firm Incision: Dressing is clean, dry, and intact DVT Evaluation: Calf/Ankle edema is present Labs: Lab Results  Component Value Date   WBC 14.0 (H) 02/05/2017   HGB 9.3 (L) 02/05/2017   HCT 29.4 (L) 02/05/2017   MCV 86.0 02/05/2017   PLT 304 02/05/2017   CMP Latest Ref Rng & Units 02/03/2017  Glucose 65 - 99 mg/dL -  BUN 6 - 20 mg/dL -  Creatinine 0.44 - 1.00 mg/dL 0.62  Sodium 135 - 145 mmol/L 138  Potassium 3.5 - 5.1 mmol/L 4.0  Chloride 101 - 111 mmol/L -  CO2 22 - 32 mmol/L -  Calcium 8.9 - 10.3 mg/dL -  Total Protein 6.5 - 8.1 g/dL -  Total Bilirubin 0.3 - 1.2 mg/dL -  Alkaline Phos 38 - 126 U/L -  AST 15 - 41 U/L -  ALT 14 - 54 U/L -    Discharge instruction: per After Visit Summary and "Baby and Me Booklet".  After visit meds:  Allergies as of 02/06/2017      Reactions   Bee Venom Shortness Of Breath   Penicillins Anaphylaxis   Has patient had a PCN reaction causing immediate rash, facial/tongue/throat swelling, SOB or lightheadedness with hypotension: No Has patient had a PCN reaction causing severe rash involving mucus membranes or skin necrosis: No Has patient had a PCN reaction that  required hospitalization No Has patient had a PCN reaction occurring within the last 10 years: No If all of the above answers are "NO", then may proceed with Cephalosporin use.' REACTION: Angioedema   Adhesive [tape] Rash   Latex Rash   Vancomycin Other (See Comments)   Pt can tolerate Vancomycin but did cause Red-Man Syndrome.  Recommend to pre-medicate with Benadryl before doses administered.        Medication List    STOP taking these medications   acetaminophen 500 MG tablet Commonly known as:  TYLENOL     TAKE these medications   albuterol 108 (90 Base) MCG/ACT inhaler Commonly known as:  PROVENTIL HFA;VENTOLIN HFA Inhale 2 puffs into the lungs every 4 (four) hours as needed  for wheezing or shortness of breath.   amLODipine 10 MG tablet Commonly known as:  NORVASC Take 1 tablet (10 mg total) by mouth daily.   buPROPion 300 MG 24 hr tablet Commonly known as:  WELLBUTRIN XL Take 300 mg by mouth every morning.   busPIRone 10 MG tablet Commonly known as:  BUSPAR Take 15 mg by mouth 2 (two) times daily. For anxiety   cetirizine 10 MG tablet Commonly known as:  ZYRTEC Take 10 mg by mouth 2 (two) times daily.   diphenhydrAMINE 25 MG tablet Commonly known as:  BENADRYL Take 25 mg by mouth daily as needed for itching.   enoxaparin 60 MG/0.6ML injection Commonly known as:  LOVENOX Inject 0.6 mLs (60 mg total) into the skin every 12 (twelve) hours.   fluticasone 110 MCG/ACT inhaler Commonly known as:  FLOVENT HFA Inhale 2 puffs into the lungs 2 (two) times daily.   montelukast 10 MG tablet Commonly known as:  SINGULAIR Take 1 tablet (10 mg total) by mouth at bedtime.   oxyCODONE-acetaminophen 5-325 MG tablet Commonly known as:  PERCOCET/ROXICET Take 1 tablet by mouth every 6 (six) hours as needed for severe pain.   pantoprazole 40 MG tablet Commonly known as:  PROTONIX TAKE 1 TABLET BY MOUTH DAILY   prenatal multivitamin Tabs tablet Take 1 tablet by mouth  daily.   senna-docusate 8.6-50 MG tablet Commonly known as:  Senokot-S Take 2 tablets by mouth daily.   triamcinolone ointment 0.1 % Commonly known as:  KENALOG Apply 1 application topically 2 (two) times daily. What changed:  when to take this  reasons to take this   Vitamin D3 5000 units Caps Take 10,000 Units by mouth daily.   vitamin E 400 UNIT capsule Take 400 Units by mouth daily.       Diet: carb modified diet  Activity: Advance as tolerated. Pelvic rest for 6 weeks.   Outpatient follow up:<1 week for incision and BP check Follow up Appt:Future Appointments Date Time Provider [place]  02/10/2017 2:45 PM Jonnie Kind, MD [facility]  02/11/2017 10:00 AM Valentina Shaggy, MD [facility] None  02/12/2017 2:00 PM Nida, Marella Chimes, MD [facility] None  02/21/2017 1:40 PM [facility] None  02/21/2017 2:20 PM [facility] COVERING PROVIDER [facility] None   Follow up Visit:No Follow-up on file.  Postpartum contraception: Nexplanon  Newborn Data:   Sarracino, Girl KEOSHIA STEINMETZ [[id number]]  Live born female  Birth Weight: 5 lb 13.7 oz (2655 g) APGAR: 8, 9  Newborn Delivery   Birth date/time:  02/03/2017 11:14:00 Delivery type:  C-Section, Low Transverse  C-section categorization:  Primary      Ellinwood, TIERRA THOMA [[id number]]  Live born female  Birth Weight: 5 lb 2.9 oz (2350 g) APGAR: 6, 7  Newborn Delivery   Birth date/time:  02/03/2017 11:14:00 Delivery type:  C-Section, Low Transverse  C-section categorization:  Primary     Baby Feeding: Breast Disposition:home with mother Twin A; Twin B in NICU   02/06/2017 Ovidio Kin, MD  CNM attestation I have seen and examined this patient and agree with above documentation in the resident's note.   Lerlene Treadwell Carrell is a 41 y.o. G2P1010 s/p pLTCS for twins/malpresentation.   Pain is well controlled.  Plan for birth control is Nexplanon.  Method of Feeding:  breast/pumping  PE:  BP (!) 149/82 (BP Location: Right Arm)   Pulse 92   Temp 98.1 F (36.7 C) (Oral)   Resp 20  Ht 5\' 3"  (1.6 m)   Wt (!) 207.3 kg (457 lb)   LMP 05/18/2016 (Exact Date)   SpO2 96%   Breastfeeding? Unknown   BMI 80.95 kg/m  Fundus firm Vertical skin inc with PICO present, dry and intact  Recent Labs  02/05/17 0810  HGB 9.3*  HCT 29.4*     Plan: discharge today - postpartum care discussed - f/u clinic within the week with Dr Glo Herring for BP and incision check   Serita Grammes, CNM 11:44 PM

## 2017-02-06 NOTE — Discharge Instructions (Signed)
Cesarean Delivery, Care After °Refer to this sheet in the next few weeks. These instructions provide you with information about caring for yourself after your procedure. Your health care provider may also give you more specific instructions. Your treatment has been planned according to current medical practices, but problems sometimes occur. Call your health care provider if you have any problems or questions after your procedure. °What can I expect after the procedure? °After the procedure, it is common to have: °· A small amount of blood or clear fluid coming from the incision. °· Some redness, swelling, and pain in your incision area. °· Some abdominal pain and soreness. °· Vaginal bleeding (lochia). °· Pelvic cramps. °· Fatigue. °Follow these instructions at home: °Incision care °· Follow instructions from your health care provider about how to take care of your incision. Make sure you: °¨ Wash your hands with soap and water before you change your bandage (dressing). If soap and water are not available, use hand sanitizer. °¨ Change your dressing as told by your health care provider. °¨ Leave stitches (sutures), skin staples, skin glue, or adhesive strips in place. These skin closures may need to stay in place for 2 weeks or longer. If adhesive strip edges start to loosen and curl up, you may trim the loose edges. Do not remove adhesive strips completely unless your health care provider tells you to do that. °· Check your incision area every day for signs of infection. Check for: °¨ More redness, swelling, or pain. °¨ More fluid or blood. °¨ Warmth. °¨ Pus or a bad smell. °· When you cough or sneeze, hug a pillow. This helps with pain and decreases the chance of your incision opening up (dehiscing). Do this until your incision heals. °Medicines °· Take over-the-counter and prescription medicines only as told by your health care provider. °· If you were prescribed an antibiotic medicine, take it as told by your  health care provider. Do not stop taking the antibiotic until it is finished. °Driving °· Do not drive or operate heavy machinery while taking prescription pain medicine. °· Do not drive for 24 hours if you received a sedative. °Lifestyle °· Do not drink alcohol. This is especially important if you are breastfeeding or taking pain medicine. °· Do not use tobacco products, including cigarettes, chewing tobacco, or e-cigarettes. If you need help quitting, ask your health care provider. Tobacco can delay wound healing. °Eating and drinking °· Drink at least 8 eight-ounce glasses of water every day unless told not to by your health care provider. If you breastfeed, you may need to drink more water than this. °· Eat high-fiber foods every day. These foods may help prevent or relieve constipation. High-fiber foods include: °¨ Whole grain cereals and breads. °¨ Brown rice. °¨ Beans. °¨ Fresh fruits and vegetables. °Activity °· Return to your normal activities as told by your health care provider. Ask your health care provider what activities are safe for you. °· Rest as much as possible. Try to rest or take a nap while your baby is sleeping. °· Do not lift anything that is heavier than your baby or 10 lb (4.5 kg) as told by your health care provider. °· Ask your health care provider when you can engage in sexual activity. This may depend on your: °¨ Risk of infection. °¨ Healing rate. °¨ Comfort and desire to engage in sexual activity. °Bathing °· Do not take baths, swim, or use a hot tub until your health care provider approves.   Ask your health care provider if you can take showers. You may only be allowed to take sponge baths until your incision heals. °· Keep your dressing dry as told by your health care provider. °General instructions °· Do not use tampons or douches until your health care provider approves. °· Wear: °¨ Loose, comfortable clothing. °¨ A supportive and well-fitting bra. °· Watch for any blood clots that  may pass from your vagina. These may look like clumps of dark red, brown, or black discharge. °· Keep your perineum clean and dry as told by your health care provider. °· Wipe from front to back when you use the toilet. °· If possible, have someone help you care for your baby and help with household activities for a few days after you leave the hospital. °· Keep all follow-up visits for you and your baby as told by your health care provider. This is important. °Contact a health care provider if: °· You have: °¨ Bad-smelling vaginal discharge. °¨ Difficulty urinating. °¨ Pain when urinating. °¨ A sudden increase or decrease in the frequency of your bowel movements. °¨ More redness, swelling, or pain around your incision. °¨ More fluid or blood coming from your incision. °¨ Pus or a bad smell coming from your incision. °¨ A fever. °¨ A rash. °¨ Little or no interest in activities you used to enjoy. °¨ Questions about caring for yourself or your baby. °¨ Nausea. °· Your incision feels warm to the touch. °· Your breasts turn red or become painful or hard. °· You feel unusually sad or worried. °· You vomit. °· You pass large blood clots from your vagina. If you pass a blood clot, save it to show to your health care provider. Do not flush blood clots down the toilet without showing your health care provider. °· You urinate more than usual. °· You are dizzy or light-headed. °· You have not breastfed and have not had a menstrual period for 12 weeks after delivery. °· You stopped breastfeeding and have not had a menstrual period for 12 weeks after stopping breastfeeding. °Get help right away if: °· You have: °¨ Pain that does not go away or get better with medicine. °¨ Chest pain. °¨ Difficulty breathing. °¨ Blurred vision or spots in your vision. °¨ Thoughts about hurting yourself or your baby. °¨ New pain in your abdomen or in one of your legs. °¨ A severe headache. °· You faint. °· You bleed from your vagina so much that  you fill two sanitary pads in one hour. °This information is not intended to replace advice given to you by your health care provider. Make sure you discuss any questions you have with your health care provider. °Document Released: 12/15/2001 Document Revised: 08/03/2015 Document Reviewed: 02/27/2015 °Elsevier Interactive Patient Education © 2017 [employer] ° °

## 2017-02-07 LAB — BPAM RBC
BLOOD PRODUCT EXPIRATION DATE: 201811062359
BLOOD PRODUCT EXPIRATION DATE: 201811152359
Blood Product Expiration Date: 201811052359
Blood Product Expiration Date: 201811152359
ISSUE DATE / TIME: 201810301518
ISSUE DATE / TIME: 201810301518
UNIT TYPE AND RH: 5100
UNIT TYPE AND RH: 9500
Unit Type and Rh: 5100
Unit Type and Rh: 9500

## 2017-02-07 LAB — TYPE AND SCREEN
ABO/RH(D): B POS
Antibody Screen: NEGATIVE
UNIT DIVISION: 0
UNIT DIVISION: 0
Unit division: 0
Unit division: 0

## 2017-02-10 ENCOUNTER — Encounter: Payer: Self-pay | Admitting: Obstetrics and Gynecology

## 2017-02-10 ENCOUNTER — Ambulatory Visit (INDEPENDENT_AMBULATORY_CARE_PROVIDER_SITE_OTHER): Payer: Medicare HMO | Admitting: Obstetrics and Gynecology

## 2017-02-10 VITALS — BP 160/84 | HR 114 | Ht 63.0 in | Wt >= 6400 oz

## 2017-02-10 DIAGNOSIS — Z9889 Other specified postprocedural states: Secondary | ICD-10-CM

## 2017-02-10 DIAGNOSIS — Z09 Encounter for follow-up examination after completed treatment for conditions other than malignant neoplasm: Secondary | ICD-10-CM

## 2017-02-10 NOTE — Progress Notes (Signed)
Patient ID: Heather [name], female   DOB: [birth year], 41 y.o.   MRN: [id number]   Subjective:  Heather [name] is a 41 y.o. female who presents for a 1 weeks postpartum visit. After primary cesarean section midline supraumbilical incision. Due to Fetal malpresentation of baby A Patient concerns: No concerns at this time. She reports losing weight and notes that her clothes have become baggy.  Prenatal and intrapartum course notable for cesarean section.   Patient is not sexually active.   The following portions of the patient's history were reviewed and updated as appropriate: allergies, current medications, past family history, past medical history, past surgical history and problem list.  Review of Systems    See Subjective, otherwise negative ROS.  Objective:  LMP 05/18/2016 (Exact Date)  BP (!) 160/84 (BP Location: Left Arm, Patient Position: Sitting, Cuff Size: Large)   Pulse (!) 114   Ht 5\' 3"  (1.6 m)   Wt (!) 419 lb 12.8 oz (190.4 kg)   LMP 05/18/2016 (Exact Date)   BMI 74.36 kg/m   General:  alert, cooperative and no distress     Lungs: clear to auscultation bilaterally  Heart:  regular rate and rhythm, S1, S2 normal, no murmur  Abdomen: soft, non-tender; bowel sounds normal; no masses,  no organomegaly, incision healing well   Vulva:  normal  Vagina: normal vagina  Cervix:  normal  Uterus: normal size, contour, position, consistency, mobility, non-tender  Adnexa:  normal adnexa          Assessment:  1. Postop exam week 1 status post primary cesarean section for fetal malpresentation of baby A of twins at 37 weeks 2. Contraception: nexplanon 3. 40 pound weight loss one week  Plan:  1. F/U 3 weeks  By signing my name below, I, Margit Banda, attest that this documentation has been prepared under the direction and in the presence of Jonnie Kind, MD. Electronically Signed: Margit Banda, Medical Scribe. 02/10/17. 2:56 PM.  I personally performed the  services described in this documentation, which was SCRIBED in my presence. The recorded information has been reviewed and considered accurate. It has been edited as necessary during review. Jonnie Kind, MD

## 2017-02-11 ENCOUNTER — Ambulatory Visit: Payer: Medicare HMO | Admitting: Allergy & Immunology

## 2017-02-12 ENCOUNTER — Ambulatory Visit: Payer: Medicare HMO | Admitting: "Endocrinology

## 2017-02-21 ENCOUNTER — Other Ambulatory Visit (HOSPITAL_COMMUNITY): Payer: Self-pay

## 2017-02-21 ENCOUNTER — Ambulatory Visit (HOSPITAL_COMMUNITY): Payer: Self-pay

## 2017-02-21 DIAGNOSIS — F332 Major depressive disorder, recurrent severe without psychotic features: Secondary | ICD-10-CM | POA: Diagnosis not present

## 2017-02-25 ENCOUNTER — Ambulatory Visit (INDEPENDENT_AMBULATORY_CARE_PROVIDER_SITE_OTHER): Payer: Medicare HMO | Admitting: Family Medicine

## 2017-02-25 ENCOUNTER — Telehealth: Payer: Self-pay | Admitting: *Deleted

## 2017-02-25 ENCOUNTER — Encounter: Payer: Self-pay | Admitting: Family Medicine

## 2017-02-25 VITALS — BP 116/68 | HR 104 | Resp 18

## 2017-02-25 DIAGNOSIS — J3089 Other allergic rhinitis: Secondary | ICD-10-CM | POA: Diagnosis not present

## 2017-02-25 DIAGNOSIS — J4530 Mild persistent asthma, uncomplicated: Secondary | ICD-10-CM | POA: Diagnosis not present

## 2017-02-25 DIAGNOSIS — O2686 Pruritic urticarial papules and plaques of pregnancy (PUPPP): Secondary | ICD-10-CM | POA: Diagnosis not present

## 2017-02-25 NOTE — Progress Notes (Signed)
FOLLOW UP  Date of Service/Encounter:  02/25/17   Assessment:   No diagnosis found.   Asthma Reportables:  Severity: mild persistent  Risk: medium Control: well controlled   Plan/Recommendations:   1. Mild persistent asthma - - Daily controller medication(s): Flovent 162mcg two puffs twice daily + Singulair 10mg  daily - Rescue medications: ProAir 2 puffs every 4 hours as needed - Changes during respiratory infections or worsening symptoms: increase Flovent 162mcg to 4 puffs twice daily for TWO WEEKS. - Asthma control goals:  * Full participation in all desired activities (may need albuterol before activity) * Albuterol use two time or less a week on average (not counting use with activity) * Cough interfering with sleep two time or less a month * Oral steroids no more than once a year * No hospitalizations   2. Perennial allergic rhinitis - Continue with nasal saline rinses 1-2 times daily. - Zyrtec (cetirizine) daily as neweded  3. Pruritic urticarial papules and plaques of pregnancy (PUPPP) - Continue with Zyrtec (cetirizine) 10mg  twice daily. - Continue with triamcinolone ointment twice daily as needed to the worst areas (both hips).    4. Follow up 3 months or sooner if needed    Subjective:   Lanaya Bennis Cipollone is a 41 y.o. female presenting today for follow up of  Chief Complaint  Patient presents with  . Asthma    No recent flares    Addilee L Eke has a history of the following: Patient Active Problem List   Diagnosis Date Noted  . Status post primary low transverse cesarean section 02/03/2017  . Mild preeclampsia, third trimester 01/27/2017  . Multiple gestation with one or more fetal malpresentations in third trimester, breech A, transverse B 01/27/2017  . Gestational htn w/o significant proteinuria, third trimester 01/20/2017  . Encounter for counseling regarding contraception 01/17/2017  . Mild persistent asthma without complication 75/64/3329    . Polyhydramnios affecting pregnancy 12/31/2016  . Asymptomatic bacteriuria during pregnancy in third trimester 12/31/2016  . BMI 70 and over, adult ([place]) 12/15/2016  . PUPP (pruritic urticarial papules and plaques of pregnancy) 11/05/2016  . Perennial allergic rhinitis 11/05/2016  . Mild persistent asthma with acute exacerbation 11/05/2016  . Excess weight gain in pregnancy, second trimester 10/30/2016  . Diabetes mellitus complicating pregnancy, antepartum 09/05/2016  . Twin pregnancy, dichorionic/diamniotic, third trimester 08/06/2016  . AMA (advanced maternal age) multigravida 35+ 07/19/2016  . Anxiety and depression 11/07/2015  . Hard to intubate 11/07/2015  . Hypoglycemia 11/07/2015  . OSA (obstructive sleep apnea) 11/07/2015  . DM type 2 (diabetes mellitus, type 2) ([place]) 12/07/2014  . Panniculitis 12/06/2014  . Cellulitis, abdominal wall 11/11/2014  . Abdominal pain 07/14/2014  . Loose stools 07/14/2014  . Melena 01/27/2014  . Eosinophilic esophagitis 51/88/4166  . Bowel habit changes 04/28/2013  . Esophageal dysphagia 04/28/2013  . Insomnia due to mental disorder(327.02) 08/08/2011  . RLS (restless legs syndrome) 08/08/2011  . Adjustment disorder with depressed mood 08/06/2011  . Schizoaffective disorder, bipolar type ([place]) 08/02/2011    Class: Acute  . PALPITATIONS, OCCASIONAL 11/01/2009  . Bipolar disorder ([place]) 10/19/2008  . DISORDER, TOBACCO USE 08/25/2008  . Leukocytosis 07/28/2008  . ALLERGIC RHINITIS, SEASONAL 08/20/2007  . DYSMETABOLIC SYNDROME 10/06/1599  . Morbid obesity ([place]) 05/13/2006  . EXTERNAL HEMORRHOIDS 05/13/2006  . HYPERLIPIDEMIA 05/12/2006  . Essential hypertension 05/12/2006  . ASTHMA 05/12/2006  . GERD (gastroesophageal reflux disease) 05/12/2006  . OSTEOARTHRITIS 05/12/2006    History obtained from: chart review and patient  interview.  Jon Gills Brodhead's Primary Care Provider is Lucianne Lei, MD.     Kamauri is a 41 y.o. female  presenting for a follow up visit.  She was last seen in this office 01/07/2017 by Dr. Ernst Bowler for evaluation of mild persistent asthma that was not well controlled, allergic rhinitis, and a pruritic rash related to pregnancy (PUPPP).  At that time, Flovent 110 was restarted at 2 puffs twice daily. Zyrtec and triamcinolone were also started.   Since the last visit, Jeyli reports an improvement in her breathing function.  She is accompanied today by her mother and twin daughters.  She reports giving birth by cesarean section 3 weeks ago with some complications surrounding her surgery that have resolved at this point.   Javae's asthma has been well controlled. She has not required rescue medication, experienced nocturnal awakenings due to lower respiratory symptoms, nor have activities of daily living been limited. She has required no Emergency Department or Urgent Care visits for her asthma. She has required zero courses of systemic steroids for asthma exacerbations since the last visit.  Her rhinitis is reported as well controlled with occasional nasal congestion in the morning.  She continues Zyrtec 10-20 mg a day and nasal saline spray as needed.  She is not currently using any nasal steroids for relief.  Cynda reports the rash in her groin area has cleared with the use of triamcinolone.  She reports a red, raised, itchy rash remains on both hips and is slowly resolving.  She uses triamcinolone as needed and is taking cetirizine 10-20 mg a day to control itching with relief.  Allysia denies depression. She reports her surgical site is healing well. She is not sleeping and feels very low on energy.  She reports some major financial concerns at this time. Her support system consists of her mother and husband. She denies suicidal and infanticidal ideation today. She has a follow up appointment with her OB/GYN on Monday, November 26. She is tearful while talking about how she is feeling today.     Otherwise, there have been no changes to her past medical history, surgical history, family history, or social history.   Review of Systems: a 14-point review of systems is pertinent for what is mentioned in HPI.  Otherwise, all other systems were negative. Constitutional: negative other than that listed in the HPI Eyes: negative other than that listed in the HPI Ears, nose, mouth, throat, and face: negative other than that listed in the HPI Respiratory: negative other than that listed in the HPI Cardiovascular: negative other than that listed in the HPI Gastrointestinal: negative other than that listed in the HPI Genitourinary: negative other than that listed in the HPI Integument: negative other than that listed in the HPI Hematologic: negative other than that listed in the HPI Musculoskeletal: negative other than that listed in the HPI Neurological: negative other than that listed in the HPI Allergy/Immunologic: negative other than that listed in the HPI    Objective:   Blood pressure 116/68, pulse (!) 104, resp. rate 18, SpO2 96 %, unknown if currently breastfeeding. There is no height or weight on file to calculate BMI.   Physical Exam:  General: Alert, interactive, in no acute distress. Eyes: No conjunctival injection present on the right and No conjunctival injection present on the left. PERRL bilaterally. EOMI without pain. No photophobia.  Ears: Right TM pearly gray with normal light reflex and Left TM pearly gray with normal light reflex.  Nose/Throat: External  nose within normal limits and septum midline. Turbinates minimally edematous without discharge. Posterior oropharynx non erythematous without cobblestoning in the posterior oropharynx. Tonsils 2+ without exudates.  Tongue without thrush. Adenopathy: no enlarged lymph nodes appreciated in the occipital, axillary, epitrochlear, inguinal, or popliteal regions. Lungs: Clear to auscultation without wheezing, rhonchi  or rales. No increased work of breathing. CV: Normal S1/S2. No murmurs. Capillary refill <2 seconds.  Skin: Red raised rash bilateral hips. No drainage noted. Neuro:   Grossly intact. No focal deficits appreciated. Responsive to questions.    Spirometry: results normal (FEV1: 81%, FVC: 83%, FEV1/FVC: 104%). Spirometry improved compared to last visit.       Salvatore Marvel, MD [facility]

## 2017-02-25 NOTE — Patient Instructions (Addendum)
1. Mild persistent asthma - - Daily controller medication(s): Flovent 167mcg two puffs twice daily + Singulair 10mg  daily - Rescue medications: ProAir 2 puffs every 4 hours as needed - Changes during respiratory infections or worsening symptoms: increase Flovent 12mcg to 4 puffs twice daily for TWO WEEKS. - Asthma control goals:  * Full participation in all desired activities (may need albuterol before activity) * Albuterol use two time or less a week on average (not counting use with activity) * Cough interfering with sleep two time or less a month * Oral steroids no more than once a year * No hospitalizations   2. Perennial allergic rhinitis - Continue with nasal saline rinses 1-2 times daily. - Zyrtec (cetirizine) daily as neweded  3. Pruritic urticarial papules and plaques of pregnancy (PUPPP) - Continue with Zyrtec (cetirizine) 10mg  twice daily. - Continue with triamcinolone ointment twice daily as needed to the worst areas (both hips).    4. Follow up 3 months or sooner if needed   Please inform us of any Emergency Department visits, hospitalizations, or changes in symptoms. Call us before going to the ED for breathing or allergy symptoms since we might be able to fit you in for a sick visit. Feel free to contact us anytime with any questions, problems, or concerns.  It was a pleasure to meet you and your family today!

## 2017-02-25 NOTE — Telephone Encounter (Signed)
Heather [name] from [facility] called to report score of 10 on depression screen. Pt is not suicidal or having thoughts of hurting anyone, just worried about finances. Has appt next Wed. Advised to keep appointment as scheduled but to let us know or go to ED if she starts to have those thoughts. Verbalized understanding.

## 2017-02-26 ENCOUNTER — Other Ambulatory Visit: Payer: Self-pay

## 2017-02-26 ENCOUNTER — Encounter (HOSPITAL_COMMUNITY): Payer: Self-pay

## 2017-02-26 ENCOUNTER — Encounter (HOSPITAL_COMMUNITY): Payer: Medicare HMO | Attending: Oncology

## 2017-02-26 ENCOUNTER — Encounter (HOSPITAL_BASED_OUTPATIENT_CLINIC_OR_DEPARTMENT_OTHER): Payer: Medicare HMO | Admitting: Oncology

## 2017-02-26 VITALS — BP 122/76 | HR 85 | Temp 98.2°F | Resp 20 | Wt >= 6400 oz

## 2017-02-26 DIAGNOSIS — D473 Essential (hemorrhagic) thrombocythemia: Secondary | ICD-10-CM | POA: Diagnosis not present

## 2017-02-26 DIAGNOSIS — Z87891 Personal history of nicotine dependence: Secondary | ICD-10-CM | POA: Insufficient documentation

## 2017-02-26 DIAGNOSIS — D649 Anemia, unspecified: Secondary | ICD-10-CM | POA: Insufficient documentation

## 2017-02-26 DIAGNOSIS — Z9889 Other specified postprocedural states: Secondary | ICD-10-CM | POA: Insufficient documentation

## 2017-02-26 DIAGNOSIS — D72829 Elevated white blood cell count, unspecified: Secondary | ICD-10-CM | POA: Diagnosis not present

## 2017-02-26 DIAGNOSIS — Z9049 Acquired absence of other specified parts of digestive tract: Secondary | ICD-10-CM | POA: Diagnosis not present

## 2017-02-26 DIAGNOSIS — E119 Type 2 diabetes mellitus without complications: Secondary | ICD-10-CM | POA: Diagnosis not present

## 2017-02-26 DIAGNOSIS — J449 Chronic obstructive pulmonary disease, unspecified: Secondary | ICD-10-CM | POA: Diagnosis not present

## 2017-02-26 LAB — CBC WITH DIFFERENTIAL/PLATELET
Basophils Absolute: 0 K/uL (ref 0.0–0.1)
Basophils Relative: 0 %
Eosinophils Absolute: 0.6 K/uL (ref 0.0–0.7)
Eosinophils Relative: 6 %
HCT: 36.6 % (ref 36.0–46.0)
Hemoglobin: 10.6 g/dL — ABNORMAL LOW (ref 12.0–15.0)
Lymphocytes Relative: 25 %
Lymphs Abs: 2.4 K/uL (ref 0.7–4.0)
MCH: 25.2 pg — ABNORMAL LOW (ref 26.0–34.0)
MCHC: 29 g/dL — ABNORMAL LOW (ref 30.0–36.0)
MCV: 86.9 fL (ref 78.0–100.0)
Monocytes Absolute: 0.5 K/uL (ref 0.1–1.0)
Monocytes Relative: 5 %
Neutro Abs: 6.3 K/uL (ref 1.7–7.7)
Neutrophils Relative %: 64 %
Platelets: 467 K/uL — ABNORMAL HIGH (ref 150–400)
RBC: 4.21 MIL/uL (ref 3.87–5.11)
RDW: 16.9 % — ABNORMAL HIGH (ref 11.5–15.5)
WBC: 9.8 K/uL (ref 4.0–10.5)

## 2017-02-26 LAB — PATHOLOGIST SMEAR REVIEW

## 2017-02-26 NOTE — Progress Notes (Signed)
Heather [name], Heather [name]  Leukocytosis, unspecified type - Plan: CBC with Differential, Comprehensive metabolic panel  CURRENT THERAPY: Observation  INTERVAL HISTORY: Heather [name] 41 y.o. female returns for followup of leukocytosis with neutrophilia predominance (since at least 2008) in the setting of eosinophilic esophagitis and reactive gastropathy diagnosed at [facility] in 2015 and asthma.  Peripheral work-up has been negative including JAK2/MPL/CALR.  ESR 51 (08/23/2014).   She presents today for continued follow-up of her chronic leukocytosis.  She states that she gave birth to twin girls about 3 weeks ago.  She is still recovering from her C-section.  She denies any recent infections.  She states she feels fatigued.  She is having some postpartum depression symptoms as well.  She denies any chest pain, shortness of breath, nausea, vomiting, diarrhea, focal weakness.  Review of Systems  Constitutional: Negative for chills, fever, malaise/fatigue and weight loss.  HENT: Negative.   Eyes: Negative.   Respiratory: Negative.  Negative for cough.   Cardiovascular: Negative.  Negative for chest pain.  Gastrointestinal: Negative.  Negative for abdominal pain, constipation, diarrhea, nausea and vomiting.  Genitourinary: Negative.  Negative for dysuria.  Musculoskeletal: Negative.   Skin: Negative.  Negative for rash.  Neurological: Negative.  Negative for weakness.  Endo/Heme/Allergies: Negative.   Psychiatric/Behavioral: Negative.     Past Medical History:  Diagnosis Date  . Amenorrhea   . Anxiety   . Anxiety   . Asthma   . COPD (chronic obstructive pulmonary disease) ([place])   . Depression   . Depression   . Diabetes mellitus without complication ([place])   . Dysmenorrhea   . Dysrhythmia    DR Johnsie Cancel    . Ectopic pregnancy 2013  . Eosinophilic esophagitis    Diagnosed at [facility] 06/16/2013, untreated  . GERD (gastroesophageal reflux  disease)    HEARTBURN   TUMS  . Hard to intubate 11/07/2015  . Leukocytosis 07/28/2008   Qualifier: Diagnosis of  By: Jonna Munro MD, Roderic Scarce    . Morbid obesity ([place])   . Neuromuscular disorder (HCC)    RESTLESS LEG   . Obesity   . Schizoaffective disorder, bipolar type ([place])   . Sepsis ([place]) 11/11/2014  . Shortness of breath    WITH EXERTION   . Sleep apnea    CPAP    Past Surgical History:  Procedure Laterality Date  . CESAREAN SECTION MULTI-GESTATIONAL N/A 02/03/2017   Procedure: CESAREAN SECTION MULTI-GESTATIONAL;  Surgeon: Jonnie Kind, MD;  Location: [place];  Service: Obstetrics;  Laterality: N/A;  . CHOLECYSTECTOMY    . DENTAL SURGERY    . ESOPHAGOGASTRODUODENOSCOPY  May 2007   Dr. Gala Romney: Normal esophagus, stomach, D1, D2  . ESOPHAGOGASTRODUODENOSCOPY  06/16/2013   Dr. Carlton Adam, eosinophilic esophagitis, reactive gastropathy, no esophageal dilation  . TONSILLECTOMY    . TOOTH EXTRACTION  10/28/2011   Procedure: DENTAL RESTORATION/EXTRACTIONS;  Surgeon: Gae Bon, DDS;  Location: [facility] OR;  Service: Oral Surgery;;    Family History  Problem Relation Age of Onset  . Depression Mother   . Anxiety disorder Mother   . Hypertension Sister   . Allergic rhinitis Sister   . Colon polyps Maternal Grandmother        23s  . Diabetes Maternal Grandmother   . Anxiety disorder Maternal Grandmother   . COPD Maternal Grandmother   . Crohn's disease Maternal Aunt   . Cancer Maternal Grandfather  prostate  . HIV/AIDS Father   . Liver disease Neg Hx   . Angioedema Neg Hx   . Eczema Neg Hx   . Immunodeficiency Neg Hx   . Asthma Neg Hx   . Urticaria Neg Hx     Social History   Socioeconomic History  . Marital status: Married    Spouse name: None  . Number of children: 0  . Years of education: None  . Highest education level: None  Social Needs  . Financial resource strain: None  . Food insecurity - worry: None  . Food insecurity - inability: None  .  Transportation needs - medical: None  . Transportation needs - non-medical: None  Occupational History  . Occupation: Psychologist, occupational  Tobacco Use  . Smoking status: Former Smoker    Packs/day: 0.50    Years: 8.00    Pack years: 4.00    Types: Cigarettes    Last attempt to quit: 04/25/2011    Years since quitting: 5.8  . Smokeless tobacco: Never Used  Substance and Sexual Activity  . Alcohol use: No  . Drug use: No  . Sexual activity: Not Currently    Birth control/protection: None    Comment: stopped smoking in jan.. had a pack the other day  Other Topics Concern  . None  Social History Narrative  . None     PHYSICAL EXAMINATION  ECOG PERFORMANCE STATUS: 1 - Symptomatic but completely ambulatory  Vitals:   02/26/17 1100  BP: 122/76  Pulse: 85  Resp: 20  Temp: 98.2 F (36.8 C)  SpO2: 100%    GENERAL:alert, no distress, cooperative, obese, smiling and unaccompanied SKIN: skin color, texture, turgor are normal, no rashes or significant lesions HEAD: Normocephalic, No masses, lesions, tenderness or abnormalities EYES: normal, EOMI, Conjunctiva are pink and non-injected EARS: External ears normal OROPHARYNX:lips, buccal mucosa, and tongue normal and mucous membranes are moist  NECK: supple, trachea midline LYMPH:  no palpable lymphadenopathy BREAST:not examined LUNGS: clear to auscultation , distant sounds HEART: regular rate & rhythm and distant heart sounds ABDOMEN:obese BACK: Back symmetric, no curvature. EXTREMITIES:less then 2 second capillary refill, no skin discoloration, no cyanosis  NEURO: alert & oriented x 3 with fluent speech, no focal motor/sensory deficits   LABORATORY DATA: CBC    Component Value Date/Time   WBC 9.8 02/26/2017 1000   RBC 4.21 02/26/2017 1000   HGB 10.6 (L) 02/26/2017 1000   HGB 11.6 01/20/2017 1137   HCT 36.6 02/26/2017 1000   HCT 37.5 01/20/2017 1137   PLT 467 (H) 02/26/2017 1000   PLT 383 (H) 01/20/2017 1137   MCV 86.9  02/26/2017 1000   MCV 84 01/20/2017 1137   MCH 25.2 (L) 02/26/2017 1000   MCHC 29.0 (L) 02/26/2017 1000   RDW 16.9 (H) 02/26/2017 1000   RDW 18.0 (H) 01/20/2017 1137   LYMPHSABS 2.4 02/26/2017 1000   MONOABS 0.5 02/26/2017 1000   EOSABS 0.6 02/26/2017 1000   BASOSABS 0.0 02/26/2017 1000      Chemistry      Component Value Date/Time   NA 138 02/03/2017 1128   NA 138 01/20/2017 1137   K 4.0 02/03/2017 1128   CL 98 (L) 01/31/2017 1120   CO2 27 01/31/2017 1120   BUN 14 01/31/2017 1120   BUN 10 01/20/2017 1137   CREATININE 0.62 02/03/2017 1455   CREATININE 0.70 07/14/2014 1451      Component Value Date/Time   CALCIUM 9.0 01/31/2017 1120   ALKPHOS 131 (H)  01/31/2017 1120   AST 16 01/31/2017 1120   ALT 13 (L) 01/31/2017 1120   BILITOT 0.2 (L) 01/31/2017 1120   BILITOT <0.2 01/20/2017 1137        PENDING LABS:   RADIOGRAPHIC STUDIES:  US Ob Follow Up  Result Date: 01/27/2017 TWINS FOLLOW UP SONOGRAM Heather [name] is in the office for a follow up sonogram of a twin gestation. She is a 41 y.o. year old G2P0010 with Estimated Date of Delivery: 02/22/17 by LMP now at  30w2dweeks gestation. Thus far the pregnancy has been otherwise complicated by DI/DI twins,essential,hypertension,obesity,type 2 diabetes. The twins are dichorionic/diamniotic. TWIN A  GESTATION: PRESENTATION: breech left FETAL ACTIVITY:          Heart rate         159          The fetus is active. AMNIOTIC FLUID: The amniotic fluid volume is  abnormal - poly, 8 cm.svp PLACENTA LOCALIZATION:  anterior GRADE 3 CERVIX: Limited view ADNEXA: Wnl,limited view GESTATIONAL AGE AND  BIOMETRICS: Gestational criteria: Estimated Date of Delivery: 02/22/17 by LMP now at 331w2drevious Scans:9          BIPARIETAL DIAMETER           8.77 cm         35+3 weeks HEAD CIRCUMFERENCE           32.25 cm         36+3 weeks ABDOMINAL CIRCUMFERENCE           31.10 cm         35 weeks FEMUR LENGTH           6.93 cm         35+4 weeks                                                        AVERAGE EGA(BY THIS SCAN):  35+4 weeks                                                 ESTIMATED FETAL WEIGHT:       2662  grams, 30 % BIOPHYSICAL PROFILE:                                                                                                      COMMENTS GROSS BODY MOVEMENT                 2  TONE                2  RESPIRATIONS                2  AMNIOTIC FLUID  2                                                          SCORE:  8/8 (Note: NST was not performed as part of this antepartum testing) ANATOMICAL SURVEY                                                                            COMMENTS CEREBRAL VENTRICLES yes normal  CHOROID PLEXUS yes normal                          FACIAL PROFILE yes normal  4 CHAMBERED HEART yes normal      DIAPHRAGM yes normal  STOMACH yes normal  RENAL REGION yes normal  BLADDER yes normal                      GENITALIA   female     SUSPECTED ABNORMALITIES:  polyhydramnios QUALITY OF SCAN: Limited view TWIN B GESTATION: PRESENTATION: Transverse head left FETAL ACTIVITY:          Heart rate         157          The fetus is active. AMNIOTIC FLUID: The amniotic fluid volume is  abnormal - poly , 12.4 cm.svp PLACENTA LOCALIZATION:  anterior GRADE 3 CERVIX: Limited view ADNEXA: Wnl,limited view GESTATIONAL AGE AND  BIOMETRICS: Gestational criteria: Estimated Date of Delivery: 02/22/17 by LMP now at 5w2dPrevious Scans:9          BIPARIETAL DIAMETER           8.5 cm         34+1 weeks   10% HEAD CIRCUMFERENCE           31.50 cm         35+2 weeks    7.3% ABDOMINAL CIRCUMFERENCE           30.97 cm         34+6 weeks   22.6% FEMUR LENGTH           6.36 cm         32+6 weeks    .7%                                                       AVERAGE EGA(BY THIS SCAN):  34+2 weeks                                                 ESTIMATED FETAL WEIGHT:       2407  grams, 15 % BIOPHYSICAL PROFILE:  COMMENTS GROSS BODY MOVEMENT                 2  TONE                2  RESPIRATIONS                2  AMNIOTIC FLUID                2                                                          SCORE:  8/8 (Note: NST was not performed as part of this antepartum testing) ANATOMICAL SURVEY                                                                            COMMENTS CEREBRAL VENTRICLES yes normal  CHOROID PLEXUS yes normal                          FACIAL PROFILE yes normal  4 CHAMBERED HEART yes normal      DIAPHRAGM yes normal  STOMACH yes normal  RENAL REGION yes normal  BLADDER yes normal                      GENITALIA   female     SUSPECTED ABNORMALITIES:  polyhydramnios QUALITY OF SCAN: Limited ultrasound because of pt body habitus The current discrepancy in  fetal weight between the twins is 9.6% which  is not clinically relevant. TECHNICIAN COMMENTS: [place] DI/DI TWINS 36+2 wks,ant pl gr 3,bilat adnexa's wnl BABY A: breech left,BPP 8/8,FHR 159 bpm,svp of fluid 8 cm,efw 2662 g 31% BABY B: trans head left,BPP 8/8,FHR 157 bpm,12.4 cm,efw 2407 g 15%,discordance 9.6% A copy of this report including all images has been saved and backed up to a second source for retrieval if needed. All measures and details of the anatomical scan, placentation, fluid volume and pelvic anatomy are contained in that report. Amber Heide Guile 01/27/2017 2:20 PM Clinical Impression and recommendations: I have reviewed the sonogram results above. Combined with the patient's current clinical course, below are my impressions and any appropriate recommendations for management based on the sonographic findings: . 1. Diamniotic dichorionic twins, now 36 weeks 2 days, with fetal malpresentation of both baby A and baby B as well as fetal well-being indicated a BPP of 8/8 on each fetus and normal amniotic fluid volume. 2. Fetal weight measured is only 9% discordance with baby B being  smaller infant 3. Anterior fundal placenta and slightly elevated amniotic fluid volume 4. We'll schedule for elective primary cesarean section due to fetal malpresentation at 37 weeks due to preeclampsia  US Ob Follow Up  Result Date: 01/27/2017 TWINS FOLLOW UP SONOGRAM Heather [name] is in the office for a follow up sonogram of a twin gestation. She is a 41 y.o. year old G2P0010 with Estimated Date of Delivery: 02/22/17 by LMP now at  [redacted]w[redacted]d  weeks gestation. Thus far the pregnancy has been otherwise complicated by DI/DI twins,essential,hypertension,obesity,type 2 diabetes. The twins are dichorionic/diamniotic. TWIN A  GESTATION: PRESENTATION: breech left FETAL ACTIVITY:          Heart rate         159          The fetus is active. AMNIOTIC FLUID: The amniotic fluid volume is  abnormal - poly, 8 cm.svp PLACENTA LOCALIZATION:  anterior GRADE 3 CERVIX: Limited view ADNEXA: Wnl,limited view GESTATIONAL AGE AND  BIOMETRICS: Gestational criteria: Estimated Date of Delivery: 02/22/17 by LMP now at 53w2dPrevious Scans:9          BIPARIETAL DIAMETER           8.77 cm         35+3 weeks HEAD CIRCUMFERENCE           32.25 cm         36+3 weeks ABDOMINAL CIRCUMFERENCE           31.10 cm         35 weeks FEMUR LENGTH           6.93 cm         35+4 weeks                                                       AVERAGE EGA(BY THIS SCAN):  35+4 weeks                                                 ESTIMATED FETAL WEIGHT:       2662  grams, 30 % BIOPHYSICAL PROFILE:                                                                                                      COMMENTS GROSS BODY MOVEMENT                 2  TONE                2  RESPIRATIONS                2  AMNIOTIC FLUID                2                                                          SCORE:  8/8 (Note: NST was not performed as part of this antepartum testing) ANATOMICAL SURVEY  COMMENTS  CEREBRAL VENTRICLES yes normal  CHOROID PLEXUS yes normal                          FACIAL PROFILE yes normal  4 CHAMBERED HEART yes normal      DIAPHRAGM yes normal  STOMACH yes normal  RENAL REGION yes normal  BLADDER yes normal                      GENITALIA   female     SUSPECTED ABNORMALITIES:  polyhydramnios QUALITY OF SCAN: Limited view TWIN B GESTATION: PRESENTATION: Transverse head left FETAL ACTIVITY:          Heart rate         157          The fetus is active. AMNIOTIC FLUID: The amniotic fluid volume is  abnormal - poly , 12.4 cm.svp PLACENTA LOCALIZATION:  anterior GRADE 3 CERVIX: Limited view ADNEXA: Wnl,limited view GESTATIONAL AGE AND  BIOMETRICS: Gestational criteria: Estimated Date of Delivery: 02/22/17 by LMP now at 32w2dPrevious Scans:9          BIPARIETAL DIAMETER           8.5 cm         34+1 weeks   10% HEAD CIRCUMFERENCE           31.50 cm         35+2 weeks    7.3% ABDOMINAL CIRCUMFERENCE           30.97 cm         34+6 weeks   22.6% FEMUR LENGTH           6.36 cm         32+6 weeks    .7%                                                       AVERAGE EGA(BY THIS SCAN):  34+2 weeks                                                 ESTIMATED FETAL WEIGHT:       2407  grams, 15 % BIOPHYSICAL PROFILE:                                                                                                      COMMENTS GROSS BODY MOVEMENT                 2  TONE                2  RESPIRATIONS                2  AMNIOTIC FLUID  2                                                          SCORE:  8/8 (Note: NST was not performed as part of this antepartum testing) ANATOMICAL SURVEY                                                                            COMMENTS CEREBRAL VENTRICLES yes normal  CHOROID PLEXUS yes normal                          FACIAL PROFILE yes normal  4 CHAMBERED HEART yes normal      DIAPHRAGM yes normal  STOMACH yes normal  RENAL REGION yes normal  BLADDER yes normal                       GENITALIA   female     SUSPECTED ABNORMALITIES:  polyhydramnios QUALITY OF SCAN: Limited ultrasound because of pt body habitus The current discrepancy in  fetal weight between the twins is 9.6% which  is not clinically relevant. TECHNICIAN COMMENTS: [place] DI/DI TWINS 36+2 wks,ant pl gr 3,bilat adnexa's wnl BABY A: breech left,BPP 8/8,FHR 159 bpm,svp of fluid 8 cm,efw 2662 g 31% BABY B: trans head left,BPP 8/8,FHR 157 bpm,12.4 cm,efw 2407 g 15%,discordance 9.6% A copy of this report including all images has been saved and backed up to a second source for retrieval if needed. All measures and details of the anatomical scan, placentation, fluid volume and pelvic anatomy are contained in that report. Amber Heide Guile 01/27/2017 2:20 PM Clinical Impression and recommendations: I have reviewed the sonogram results above. Combined with the patient's current clinical course, below are my impressions and any appropriate recommendations for management based on the sonographic findings: . 1. Diamniotic dichorionic twins, now 36 weeks 2 days, with fetal malpresentation of both baby A and baby B as well as fetal well-being indicated a BPP of 8/8 on each fetus and normal amniotic fluid volume. 2. Fetal weight measured is only 9% discordance with baby B being smaller infant 3. Anterior fundal placenta and slightly elevated amniotic fluid volume 4. We'll schedule for elective primary cesarean section due to fetal malpresentation at 37 weeks due to preeclampsia  US Fetal Bpp W/o Non Stress  Result Date: 01/27/2017 TWINS FOLLOW UP SONOGRAM Heather [name] is in the office for a follow up sonogram of a twin gestation. She is a 41 y.o. year old G2P0010 with Estimated Date of Delivery: 02/22/17 by LMP now at  85w2dweeks gestation. Thus far the pregnancy has been otherwise complicated by DI/DI twins,essential,hypertension,obesity,type 2 diabetes. The twins are dichorionic/diamniotic. TWIN A  GESTATION: PRESENTATION: breech  left FETAL ACTIVITY:          Heart rate         159          The fetus is active. AMNIOTIC FLUID: The amniotic fluid volume is  abnormal - poly,  8 cm.svp PLACENTA LOCALIZATION:  anterior GRADE 3 CERVIX: Limited view ADNEXA: Wnl,limited view GESTATIONAL AGE AND  BIOMETRICS: Gestational criteria: Estimated Date of Delivery: 02/22/17 by LMP now at 53w2dPrevious Scans:9          BIPARIETAL DIAMETER           8.77 cm         35+3 weeks HEAD CIRCUMFERENCE           32.25 cm         36+3 weeks ABDOMINAL CIRCUMFERENCE           31.10 cm         35 weeks FEMUR LENGTH           6.93 cm         35+4 weeks                                                       AVERAGE EGA(BY THIS SCAN):  35+4 weeks                                                 ESTIMATED FETAL WEIGHT:       2662  grams, 30 % BIOPHYSICAL PROFILE:                                                                                                      COMMENTS GROSS BODY MOVEMENT                 2  TONE                2  RESPIRATIONS                2  AMNIOTIC FLUID                2                                                          SCORE:  8/8 (Note: NST was not performed as part of this antepartum testing) ANATOMICAL SURVEY                                                                            COMMENTS CEREBRAL VENTRICLES yes normal  CHOROID PLEXUS yes normal  FACIAL PROFILE yes normal  4 CHAMBERED HEART yes normal      DIAPHRAGM yes normal  STOMACH yes normal  RENAL REGION yes normal  BLADDER yes normal                      GENITALIA   female     SUSPECTED ABNORMALITIES:  polyhydramnios QUALITY OF SCAN: Limited view TWIN B GESTATION: PRESENTATION: Transverse head left FETAL ACTIVITY:          Heart rate         157          The fetus is active. AMNIOTIC FLUID: The amniotic fluid volume is  abnormal - poly , 12.4 cm.svp PLACENTA LOCALIZATION:  anterior GRADE 3 CERVIX: Limited view ADNEXA: Wnl,limited view GESTATIONAL AGE AND   BIOMETRICS: Gestational criteria: Estimated Date of Delivery: 02/22/17 by LMP now at 61w2dPrevious Scans:9          BIPARIETAL DIAMETER           8.5 cm         34+1 weeks   10% HEAD CIRCUMFERENCE           31.50 cm         35+2 weeks    7.3% ABDOMINAL CIRCUMFERENCE           30.97 cm         34+6 weeks   22.6% FEMUR LENGTH           6.36 cm         32+6 weeks    .7%                                                       AVERAGE EGA(BY THIS SCAN):  34+2 weeks                                                 ESTIMATED FETAL WEIGHT:       2407  grams, 15 % BIOPHYSICAL PROFILE:                                                                                                      COMMENTS GROSS BODY MOVEMENT                 2  TONE                2  RESPIRATIONS                2  AMNIOTIC FLUID                2  SCORE:  8/8 (Note: NST was not performed as part of this antepartum testing) ANATOMICAL SURVEY                                                                            COMMENTS CEREBRAL VENTRICLES yes normal  CHOROID PLEXUS yes normal                          FACIAL PROFILE yes normal  4 CHAMBERED HEART yes normal      DIAPHRAGM yes normal  STOMACH yes normal  RENAL REGION yes normal  BLADDER yes normal                      GENITALIA   female     SUSPECTED ABNORMALITIES:  polyhydramnios QUALITY OF SCAN: Limited ultrasound because of pt body habitus The current discrepancy in  fetal weight between the twins is 9.6% which  is not clinically relevant. TECHNICIAN COMMENTS: [place] DI/DI TWINS 36+2 wks,ant pl gr 3,bilat adnexa's wnl BABY A: breech left,BPP 8/8,FHR 159 bpm,svp of fluid 8 cm,efw 2662 g 31% BABY B: trans head left,BPP 8/8,FHR 157 bpm,12.4 cm,efw 2407 g 15%,discordance 9.6% A copy of this report including all images has been saved and backed up to a second source for retrieval if needed. All measures and details of the anatomical scan, placentation, fluid  volume and pelvic anatomy are contained in that report. Amber Heide Guile 01/27/2017 2:20 PM Clinical Impression and recommendations: I have reviewed the sonogram results above. Combined with the patient's current clinical course, below are my impressions and any appropriate recommendations for management based on the sonographic findings: . 1. Diamniotic dichorionic twins, now 36 weeks 2 days, with fetal malpresentation of both baby A and baby B as well as fetal well-being indicated a BPP of 8/8 on each fetus and normal amniotic fluid volume. 2. Fetal weight measured is only 9% discordance with baby B being smaller infant 3. Anterior fundal placenta and slightly elevated amniotic fluid volume 4. We'll schedule for elective primary cesarean section due to fetal malpresentation at 37 weeks due to preeclampsia  US Fetal Bpp Wo Non Stress  Result Date: 01/27/2017 TWINS FOLLOW UP SONOGRAM Heather [name] is in the office for a follow up sonogram of a twin gestation. She is a 42 y.o. year old G2P0010 with Estimated Date of Delivery: 02/22/17 by LMP now at  28w2dweeks gestation. Thus far the pregnancy has been otherwise complicated by DI/DI twins,essential,hypertension,obesity,type 2 diabetes. The twins are dichorionic/diamniotic. TWIN A  GESTATION: PRESENTATION: breech left FETAL ACTIVITY:          Heart rate         159          The fetus is active. AMNIOTIC FLUID: The amniotic fluid volume is  abnormal - poly, 8 cm.svp PLACENTA LOCALIZATION:  anterior GRADE 3 CERVIX: Limited view ADNEXA: Wnl,limited view GESTATIONAL AGE AND  BIOMETRICS: Gestational criteria: Estimated Date of Delivery: 02/22/17 by LMP now at 375w2drevious Scans:9          BIPARIETAL DIAMETER           8.77 cm  35+3 weeks HEAD CIRCUMFERENCE           32.25 cm         36+3 weeks ABDOMINAL CIRCUMFERENCE           31.10 cm         35 weeks FEMUR LENGTH           6.93 cm         35+4 weeks                                                        AVERAGE EGA(BY THIS SCAN):  35+4 weeks                                                 ESTIMATED FETAL WEIGHT:       2662  grams, 30 % BIOPHYSICAL PROFILE:                                                                                                      COMMENTS GROSS BODY MOVEMENT                 2  TONE                2  RESPIRATIONS                2  AMNIOTIC FLUID                2                                                          SCORE:  8/8 (Note: NST was not performed as part of this antepartum testing) ANATOMICAL SURVEY                                                                            COMMENTS CEREBRAL VENTRICLES yes normal  CHOROID PLEXUS yes normal                          FACIAL PROFILE yes normal  4 CHAMBERED HEART yes normal      DIAPHRAGM yes normal  STOMACH yes normal  RENAL REGION yes normal  BLADDER yes normal  GENITALIA   female     SUSPECTED ABNORMALITIES:  polyhydramnios QUALITY OF SCAN: Limited view TWIN B GESTATION: PRESENTATION: Transverse head left FETAL ACTIVITY:          Heart rate         157          The fetus is active. AMNIOTIC FLUID: The amniotic fluid volume is  abnormal - poly , 12.4 cm.svp PLACENTA LOCALIZATION:  anterior GRADE 3 CERVIX: Limited view ADNEXA: Wnl,limited view GESTATIONAL AGE AND  BIOMETRICS: Gestational criteria: Estimated Date of Delivery: 02/22/17 by LMP now at 66w2dPrevious Scans:9          BIPARIETAL DIAMETER           8.5 cm         34+1 weeks   10% HEAD CIRCUMFERENCE           31.50 cm         35+2 weeks    7.3% ABDOMINAL CIRCUMFERENCE           30.97 cm         34+6 weeks   22.6% FEMUR LENGTH           6.36 cm         32+6 weeks    .7%                                                       AVERAGE EGA(BY THIS SCAN):  34+2 weeks                                                 ESTIMATED FETAL WEIGHT:       2407  grams, 15 % BIOPHYSICAL PROFILE:                                                                                                       COMMENTS GROSS BODY MOVEMENT                 2  TONE                2  RESPIRATIONS                2  AMNIOTIC FLUID                2                                                          SCORE:  8/8 (Note: NST was not performed as part of this antepartum testing) ANATOMICAL SURVEY  COMMENTS CEREBRAL VENTRICLES yes normal  CHOROID PLEXUS yes normal                          FACIAL PROFILE yes normal  4 CHAMBERED HEART yes normal      DIAPHRAGM yes normal  STOMACH yes normal  RENAL REGION yes normal  BLADDER yes normal                      GENITALIA   female     SUSPECTED ABNORMALITIES:  polyhydramnios QUALITY OF SCAN: Limited ultrasound because of pt body habitus The current discrepancy in  fetal weight between the twins is 9.6% which  is not clinically relevant. TECHNICIAN COMMENTS: [place] DI/DI TWINS 36+2 wks,ant pl gr 3,bilat adnexa's wnl BABY A: breech left,BPP 8/8,FHR 159 bpm,svp of fluid 8 cm,efw 2662 g 31% BABY B: trans head left,BPP 8/8,FHR 157 bpm,12.4 cm,efw 2407 g 15%,discordance 9.6% A copy of this report including all images has been saved and backed up to a second source for retrieval if needed. All measures and details of the anatomical scan, placentation, fluid volume and pelvic anatomy are contained in that report. Amber Heide Guile 01/27/2017 2:20 PM Clinical Impression and recommendations: I have reviewed the sonogram results above. Combined with the patient's current clinical course, below are my impressions and any appropriate recommendations for management based on the sonographic findings: . 1. Diamniotic dichorionic twins, now 36 weeks 2 days, with fetal malpresentation of both baby A and baby B as well as fetal well-being indicated a BPP of 8/8 on each fetus and normal amniotic fluid volume. 2. Fetal weight measured is only 9% discordance with baby B being smaller infant 3. Anterior fundal placenta and  slightly elevated amniotic fluid volume 4. We'll schedule for elective primary cesarean section due to fetal malpresentation at 37 weeks due to preeclampsia    PATHOLOGY:    ASSESSMENT AND PLAN:  Leukocytosis with neutrophilia predominance (since at least 2008) in the setting of eosinophilic esophagitis and reactive gastropathy diagnosed at [facility] in 2015 and asthma.  Peripheral work-up has been negative including JAK2/MPL/CALR.  ESR 51 (08/23/2014). Suspect leukocytosis is reactive to inflammation/inflammatory condition.  -WBC normal today at 9.8 g/dL. -Anemia with reactive thrombocytosis likely due to recent blood loss from C-section. Advised patient to take an OTC iron supplement daily. -RTC in 1 year. If labs stable, can discharge from the clinic to follow up with PCP.  ORDERS PLACED FOR THIS ENCOUNTER: Orders Placed This Encounter  Procedures  . CBC with Differential  . Comprehensive metabolic panel    THERAPY PLAN:  Continue ongoing observation and if labs are stable over the next 12 months, will discharge patient from the clinic.  All questions were answered. The patient knows to call the clinic with any problems, questions or concerns. We can certainly see the patient much sooner if necessary.  This note is electronically signed by: Twana First, MD 02/26/2017 1:58 PM

## 2017-02-26 NOTE — Patient Instructions (Signed)
[place] at [facility] Discharge Instructions  RECOMMENDATIONS MADE BY THE CONSULTANT AND ANY TEST RESULTS WILL BE SENT TO YOUR REFERRING PHYSICIAN.  You were seen today by Dr. Twana First Lab work in 6 months Follow up in 1 year with labs   Thank you for choosing [facility] to provide your oncology and hematology care.  To afford each patient quality time with our provider, please arrive at least 15 minutes before your scheduled appointment time.    If you have a lab appointment with the [facility] please come in thru the  Main Entrance and check in at the main information desk  You need to re-schedule your appointment should you arrive 10 or more minutes late.  We strive to give you quality time with our providers, and arriving late affects you and other patients whose appointments are after yours.  Also, if you no show three or more times for appointments you may be dismissed from the clinic at the providers discretion.     Again, thank you for choosing [facility].  Our hope is that these requests will decrease the amount of time that you wait before being seen by our physicians.       _____________________________________________________________  Should you have questions after your visit to [facility], please contact our office at [phone] between the hours of 8:30 a.m. and 4:30 p.m.  Voicemails left after 4:30 p.m. will not be returned until the following business day.  For prescription refill requests, have your pharmacy contact our office.       Resources For Cancer Patients and their Caregivers ? American Cancer Society: Can assist with transportation, wigs, general needs, runs Look Good Feel Better.        367-351-1542 ? Cancer Care: Provides financial assistance, online support groups, medication/co-pay assistance.  1-800-813-HOPE (929) 510-1976) ? [place] Assists [place] cancer patients and their families through emotional , educational and financial support.  671-300-9058 ? [place] DSS Where to apply for food stamps, Medicaid and utility assistance. (579) 758-0813 ? [employer]: Transportation to medical appointments. 513 156 1727 ? Social Security Administration: May apply for disability if have a Stage IV cancer. 330-452-9565 587-227-8959 ? [employer], Disability and Transit Services: Assists with nutrition, care and transit needs. [place] Support Programs: @10RELATIVEDAYS @ > Cancer Support Group  2nd Tuesday of the month 1pm-2pm, Journey Room  > Creative Journey  3rd Tuesday of the month 1130am-1pm, Journey Room  > Look Good Feel Better  1st Wednesday of the month 10am-12 noon, Journey Room (Call [place] to register [phone])

## 2017-03-03 ENCOUNTER — Ambulatory Visit: Payer: Medicare HMO | Admitting: Women's Health

## 2017-03-05 ENCOUNTER — Ambulatory Visit (INDEPENDENT_AMBULATORY_CARE_PROVIDER_SITE_OTHER): Payer: Medicare HMO | Admitting: Women's Health

## 2017-03-05 ENCOUNTER — Encounter: Payer: Self-pay | Admitting: Women's Health

## 2017-03-05 DIAGNOSIS — O165 Unspecified maternal hypertension, complicating the puerperium: Secondary | ICD-10-CM | POA: Insufficient documentation

## 2017-03-05 NOTE — Progress Notes (Signed)
POSTPARTUM VISIT Patient name: Heather [name] MRN [id number]  Date of birth: 08-08-1975 Chief Complaint:   Postpartum Care  History of Present Illness:   Heather [name] is a 41 y.o. G31P1012 Caucasian female being seen today for a postpartum visit. She is 4 weeks postpartum following a repeat cesarean section, low vertical incision at 37.2 gestational weeks d/t di-di twin pregnancy, mild preeclampsia, and baby A breech. Anesthesia: continuous spinal. She was d/c'd from hospital on norvasc 10mg  daily. She has not taken yet today. I have fully reviewed the prenatal and intrapartum course. Pregnancy complicated by di-di twin gestation, anxiety/depression, uti third trimester, h/o Type2DM- A1Cs and sugars normal during pregnancy, polyhydramnios, pre-e w/o severe features. Postpartum course has been uncomplicated. Bleeding no bleeding. Bowel function is normal. Bladder function is normal. Has lost 50lbs since last prenatal visit prior to c/s! Patient is not sexually active. Last sexual activity: prior to birth of baby.  Contraception method is wants nexplanon, discussed w/ LHE- effectiveness can be decreased w/ morbid obesity, discussed w/ pt who understands this, states she had 2 years ago and did well w/ it, didn't get pregnant until 1 year after she had it removed.  [place] Postpartum Depression Screening: negative. Score 8.   Last pap [facility] 04/2015.  Results were normal .  No LMP recorded.  Baby A's course has been uncomplicated. Baby B's course complicated by short NICU stay, but home now and doing well. Babies are feeding by bottle.  Review of Systems:   Pertinent items are noted in HPI Denies Abnormal vaginal discharge w/ itching/odor/irritation, headaches, visual changes, shortness of breath, chest pain, abdominal pain, severe nausea/vomiting, or problems with urination or bowel movements. Pertinent History Reviewed:  Reviewed past medical,surgical, obstetrical and family history.   Reviewed problem list, medications and allergies. OB History  Gravida Para Term Preterm AB Living  2 1 1   1 2   SAB TAB Ectopic Multiple Live Births  0   1 1 2     # Outcome Date GA Lbr Len/2nd Weight Sex Delivery Anes PTL Lv  2A Term 02/03/17 [redacted]w[redacted]d  5 lb 13.7 oz (2.655 kg) F CS-LTranv Spinal  LIV  2B Term 02/03/17 [redacted]w[redacted]d  5 lb 2.9 oz (2.35 kg) F CS-LTranv Spinal  LIV  1 Ectopic 05/2011             Physical Assessment:   Vitals:   03/05/17 1354  BP: 130/90  Pulse: 93  Weight: (!) 407 lb 3.2 oz (184.7 kg)  Body mass index is 72.13 kg/m.       Physical Examination:   General appearance: alert, well appearing, and in no distress  Mental status: alert, oriented to person, place, and time  Skin: warm & dry   Cardiovascular: normal heart rate noted   Respiratory: normal respiratory effort, no distress   Breasts: deferred, no complaints   Abdomen: soft, non-tender, vertical c/s skin incision well-healed   Pelvic: exam declined by the patient, no problems  Rectal: not examined  Extremities: no edema       No results found for this or any previous visit (from the past 24 hour(s)).  Assessment & Plan:  1) Postpartum exam 2) 4 wks s/p PLVCS d/t twins, baby A breech, mild pre-e 3) Bottlefeeding 4) Depression screening 5) Contraception counseling, pt prefers Nexplanon, abstinence until insertion 6) PP HTN> continue norvasc 10mg  (hasn't taken dose yet today)  Follow-up: Return in about 3 weeks (around 03/26/2017) for nexplanon insertion (order today  please).   No orders of the defined types were placed in this encounter.   Tawnya Crook CNM, [facility] 03/05/2017 5:15 PM

## 2017-03-06 ENCOUNTER — Telehealth: Payer: Self-pay | Admitting: *Deleted

## 2017-03-06 NOTE — Telephone Encounter (Addendum)
Left message to return call in regards to how she is feeling.

## 2017-03-06 NOTE — Telephone Encounter (Signed)
Left message to return call follow up from last visit

## 2017-03-06 NOTE — Telephone Encounter (Signed)
Patient is returning a call from [place].

## 2017-03-07 NOTE — Telephone Encounter (Signed)
Patient called back and stated that she is doing much better since her last visit.

## 2017-03-07 NOTE — Telephone Encounter (Signed)
Called and left message following up on patient since her last visit.

## 2017-03-14 DIAGNOSIS — M545 Low back pain: Secondary | ICD-10-CM | POA: Diagnosis not present

## 2017-03-14 DIAGNOSIS — M9903 Segmental and somatic dysfunction of lumbar region: Secondary | ICD-10-CM | POA: Diagnosis not present

## 2017-03-14 DIAGNOSIS — M542 Cervicalgia: Secondary | ICD-10-CM | POA: Diagnosis not present

## 2017-03-14 DIAGNOSIS — M9902 Segmental and somatic dysfunction of thoracic region: Secondary | ICD-10-CM | POA: Diagnosis not present

## 2017-03-14 DIAGNOSIS — M546 Pain in thoracic spine: Secondary | ICD-10-CM | POA: Diagnosis not present

## 2017-03-14 DIAGNOSIS — M9901 Segmental and somatic dysfunction of cervical region: Secondary | ICD-10-CM | POA: Diagnosis not present

## 2017-03-19 ENCOUNTER — Ambulatory Visit: Payer: Medicare HMO | Admitting: "Endocrinology

## 2017-03-27 ENCOUNTER — Encounter: Payer: Self-pay | Admitting: Women's Health

## 2017-03-27 ENCOUNTER — Ambulatory Visit (INDEPENDENT_AMBULATORY_CARE_PROVIDER_SITE_OTHER): Payer: Medicare HMO | Admitting: Women's Health

## 2017-03-27 VITALS — BP 136/80 | HR 82 | Ht 63.0 in | Wt >= 6400 oz

## 2017-03-27 DIAGNOSIS — Z3046 Encounter for surveillance of implantable subdermal contraceptive: Secondary | ICD-10-CM | POA: Diagnosis not present

## 2017-03-27 DIAGNOSIS — Z3202 Encounter for pregnancy test, result negative: Secondary | ICD-10-CM | POA: Diagnosis not present

## 2017-03-27 DIAGNOSIS — Z30017 Encounter for initial prescription of implantable subdermal contraceptive: Secondary | ICD-10-CM | POA: Insufficient documentation

## 2017-03-27 LAB — POCT URINE PREGNANCY: Preg Test, Ur: NEGATIVE

## 2017-03-27 MED ORDER — ETONOGESTREL 68 MG ~~LOC~~ IMPL
68.0000 mg | DRUG_IMPLANT | Freq: Once | SUBCUTANEOUS | Status: AC
  Administered 2017-03-27: 68 mg via SUBCUTANEOUS

## 2017-03-27 NOTE — Patient Instructions (Signed)
Keep the area clean and dry.  You can remove the big bandage in 24 hours, and the small steri-strip bandage in 3-5 days.  A back up method, such as condoms, should be used for two weeks. You may have irregular vaginal bleeding for the first 6 months after the Nexplanon is placed, then the bleeding usually lightens and it is possible that you may not have any periods.  If you have any concerns, please give us a call.    Etonogestrel implant What is this medicine? ETONOGESTREL (et oh noe JES trel) is a contraceptive (birth control) device. It is used to prevent pregnancy. It can be used for up to 3 years. This medicine may be used for other purposes; ask your health care provider or pharmacist if you have questions. COMMON BRAND NAME(S): Implanon, Nexplanon What should I tell my health care provider before I take this medicine? They need to know if you have any of these conditions: -abnormal vaginal bleeding -blood vessel disease or blood clots -cancer of the breast, cervix, or liver -depression -diabetes -gallbladder disease -headaches -heart disease or recent heart attack -high blood pressure -high cholesterol -kidney disease -liver disease -renal disease -seizures -tobacco smoker -an unusual or allergic reaction to etonogestrel, other hormones, anesthetics or antiseptics, medicines, foods, dyes, or preservatives -pregnant or trying to get pregnant -breast-feeding How should I use this medicine? This device is inserted just under the skin on the inner side of your upper arm by a health care professional. Talk to your pediatrician regarding the use of this medicine in children. Special care may be needed. Overdosage: If you think you have taken too much of this medicine contact a poison control center or emergency room at once. NOTE: This medicine is only for you. Do not share this medicine with others. What if I miss a dose? This does not apply. What may interact with this  medicine? Do not take this medicine with any of the following medications: -amprenavir -bosentan -fosamprenavir This medicine may also interact with the following medications: -barbiturate medicines for inducing sleep or treating seizures -certain medicines for fungal infections like ketoconazole and itraconazole -grapefruit juice -griseofulvin -medicines to treat seizures like carbamazepine, felbamate, oxcarbazepine, phenytoin, topiramate -modafinil -phenylbutazone -rifampin -rufinamide -some medicines to treat HIV infection like atazanavir, indinavir, lopinavir, nelfinavir, tipranavir, ritonavir -St. John's wort This list may not describe all possible interactions. Give your health care provider a list of all the medicines, herbs, non-prescription drugs, or dietary supplements you use. Also tell them if you smoke, drink alcohol, or use illegal drugs. Some items may interact with your medicine. What should I watch for while using this medicine? This product does not protect you against HIV infection (AIDS) or other sexually transmitted diseases. You should be able to feel the implant by pressing your fingertips over the skin where it was inserted. Contact your doctor if you cannot feel the implant, and use a non-hormonal birth control method (such as condoms) until your doctor confirms that the implant is in place. If you feel that the implant may have broken or become bent while in your arm, contact your healthcare provider. What side effects may I notice from receiving this medicine? Side effects that you should report to your doctor or health care professional as soon as possible: -allergic reactions like skin rash, itching or hives, swelling of the face, lips, or tongue -breast lumps -changes in emotions or moods -depressed mood -heavy or prolonged menstrual bleeding -pain, irritation, swelling, or bruising at   the insertion site -scar at site of insertion -signs of infection at the  insertion site such as fever, and skin redness, pain or discharge -signs of pregnancy -signs and symptoms of a blood clot such as breathing problems; changes in vision; chest pain; severe, sudden headache; pain, swelling, warmth in the leg; trouble speaking; sudden numbness or weakness of the face, arm or leg -signs and symptoms of liver injury like dark yellow or brown urine; general ill feeling or flu-like symptoms; light-colored stools; loss of appetite; nausea; right upper belly pain; unusually weak or tired; yellowing of the eyes or skin -unusual vaginal bleeding, discharge -signs and symptoms of a stroke like changes in vision; confusion; trouble speaking or understanding; severe headaches; sudden numbness or weakness of the face, arm or leg; trouble walking; dizziness; loss of balance or coordination Side effects that usually do not require medical attention (report to your doctor or health care professional if they continue or are bothersome): -acne -back pain -breast pain -changes in weight -dizziness -general ill feeling or flu-like symptoms -headache -irregular menstrual bleeding -nausea -sore throat -vaginal irritation or inflammation This list may not describe all possible side effects. Call your doctor for medical advice about side effects. You may report side effects to FDA at 1-800-FDA-1088. Where should I keep my medicine? This drug is given in a hospital or clinic and will not be stored at home. NOTE: This sheet is a summary. It may not cover all possible information. If you have questions about this medicine, talk to your doctor, pharmacist, or health care provider.  2018 Elsevier/Gold Standard (2015-10-12 11:19:22)  

## 2017-03-27 NOTE — Progress Notes (Signed)
   [place] INSERTION Patient name: Heather [name] MRN [id number]  Date of birth: [birth year] Subjective Findings:   Heather [name] is a 41 y.o. G68P1012 Caucasian female being seen today for insertion of a Nexplanon. Had been taking norvasc 10mg  for PPHTN, stopped b/c ran out. Has appt w/ PCP at beginning of year. Wants note to return to work 04/10/16-note given.   No LMP recorded. Last sexual intercourse was prior to birth of babies Last pap1/2017. Results were:  normal  Risks/benefits/side effects of Nexplanon have been discussed and her questions have been answered.  Specifically, a failure rate of 04/998 has been reported, with an increased failure rate if pt takes [place] and/or antiseizure medicaitons.  She is aware of the common side effect of irregular bleeding, which the incidence of decreases over time. She is also aware that effectiveness of Nexplanon is decreased w/ her BMI- she states she has had before when she was heavier and it worked well.  Signed copy of informed consent in chart.  Pertinent History Reviewed:   Reviewed past medical,surgical, social, obstetrical and family history.  Reviewed problem list, medications and allergies. Objective Findings & Procedure:    Vitals:   03/27/17 1417  BP: 136/80  Pulse: 82  Weight: (!) 411 lb (186.4 kg)  Height: 5\' 3"  (1.6 m)  Body mass index is 72.81 kg/m.  Results for orders placed or performed in visit on 03/27/17 (from the past 24 hour(s))  POCT urine pregnancy   Collection Time: 03/27/17  2:21 PM  Result Value Ref Range   Preg Test, Ur Negative Negative     Time out was performed.  She is left-handed, so her right arm, approximately 4 inches proximal from the elbow, was cleansed with alcohol and anesthetized with 2cc of 2% Lidocaine.  The area was cleansed again with betadine and the Nexplanon was inserted per manufacturer's recommendations without difficulty.  3 steri-strips and pressure bandage were applied. The  patient tolerated the procedure well.  Assessment & Plan:   1) Nexplanon insertion Pt was instructed to keep the area clean and dry, remove pressure bandage in 24 hours, and keep insertion site covered with the steri-strip for 3-5 days.  Back up contraception was recommended for 2 weeks.  She was given a card indicating date Nexplanon was inserted and date it needs to be removed. Follow-up PRN problems.  2) Resolved PPHTN, ok to not take norvasc for now, have PCP recheck BP at visit at beginning of year  Orders Placed This Encounter  Procedures  . POCT urine pregnancy    Follow-up: Return in about 1 year (around 03/27/2018) for Pap & physical.  Tawnya Crook CNM, [facility] 03/27/2017 2:55 PM

## 2017-04-09 ENCOUNTER — Ambulatory Visit (INDEPENDENT_AMBULATORY_CARE_PROVIDER_SITE_OTHER): Payer: Medicare HMO | Admitting: "Endocrinology

## 2017-04-09 ENCOUNTER — Encounter: Payer: Self-pay | Admitting: "Endocrinology

## 2017-04-09 ENCOUNTER — Telehealth: Payer: Self-pay | Admitting: *Deleted

## 2017-04-09 NOTE — Patient Instructions (Signed)

## 2017-04-09 NOTE — Telephone Encounter (Signed)
Called patient and left message for patient to return call to office.  Call made per Dr. Ernst Bowler to check on how patient has been feeling.  Please get updated information from patient when she returns call.

## 2017-04-09 NOTE — Progress Notes (Signed)
HPI: Heather [name] is a 42 y.o.-year-old female.  She is returning for follow-up for obesity, she is status post cesarean delivery for twin pregnancy on 02/03/2017. Delivery was complicated by pre-eclampsia.  -  Just prior to her  Delivery, she weighed 458 pounds.  - She has lost approximately 50 pounds this delivery. She is not planning breast-feeding. - She reports that she has been overweight/obese for most of her adult life. - After several years of unsuccessful attempt to conceive, earlier this year she was able to lose significant amount of weight (around 100 pounds), which helped her achieve pregnancy with favorable outcomes.  - Patient admits she did not make any changes in her dietary habit due to death in the family .  - She  continues to have significant dietary indiscretion, consuming large quantities of sweets including ice cream, cakes,  various forms of soda, Sweet tea, candies, chips, cookies, artificial sweeteners. She has gained 15 pounds since last visit. - She also admits that her portion sizes are large, and does not follow any particular meal pattern. She is following with a dietitian in [place], but reportedly has not been consistent with her follow-ups.   Lab Results  Component Value Date   HGBA1C 5.3 01/13/2017    - No h/o hypothyroidism. Last thyroid tests: Lab Results  Component Value Date   TSH 1.74 11/28/2016   FREET4 1.2 11/28/2016   - last set of lipids: Lab Results  Component Value Date   CHOL 142 06/30/2008   HDL 36 (L) 06/30/2008   LDLCALC 54 06/30/2008   TRIG 259 (H) 06/30/2008   CHOLHDL 3.9 Ratio 06/30/2008    I reviewed her chart and she also has a history of Hypertension, asthma, obstructive sleep apnea, gastroesophageal reflux disease.  Patient has Family history of  of diabetes and one of her grandparents, mood disorder in mother, hypertension in her sister.   Past Medical History:  Diagnosis Date  . Amenorrhea   . Anxiety   .  Anxiety   . Asthma   . COPD (chronic obstructive pulmonary disease) ([place])   . Depression   . Depression   . Diabetes mellitus without complication ([place])   . Dysmenorrhea   . Dysrhythmia    DR Johnsie Cancel    . Ectopic pregnancy 2013  . Eosinophilic esophagitis    Diagnosed at [facility] 06/16/2013, untreated  . GERD (gastroesophageal reflux disease)    HEARTBURN   TUMS  . Hard to intubate 11/07/2015  . Leukocytosis 07/28/2008   Qualifier: Diagnosis of  By: Jonna Munro MD, Roderic Scarce    . Morbid obesity ([place])   . Neuromuscular disorder (HCC)    RESTLESS LEG   . Obesity   . Schizoaffective disorder, bipolar type ([place])   . Sepsis ([place]) 11/11/2014  . Shortness of breath    WITH EXERTION   . Sleep apnea    CPAP   Past Surgical History:  Procedure Laterality Date  . CESAREAN SECTION MULTI-GESTATIONAL N/A 02/03/2017   Procedure: CESAREAN SECTION MULTI-GESTATIONAL;  Surgeon: Jonnie Kind, MD;  Location: [facility];  Service: Obstetrics;  Laterality: N/A;  . CHOLECYSTECTOMY    . DENTAL SURGERY    . ESOPHAGOGASTRODUODENOSCOPY  May 2007   Dr. Gala Romney: Normal esophagus, stomach, D1, D2  . ESOPHAGOGASTRODUODENOSCOPY  06/16/2013   Dr. Carlton Adam, eosinophilic esophagitis, reactive gastropathy, no esophageal dilation  . TONSILLECTOMY    . TOOTH EXTRACTION  10/28/2011   Procedure: DENTAL RESTORATION/EXTRACTIONS;  Surgeon: Gae Bon, DDS;  Location:  [facility] OR;  Service: Oral Surgery;;   Social History   Socioeconomic History  . Marital status: Married    Spouse name: None  . Number of children: 0  . Years of education: None  . Highest education level: None  Social Needs  . Financial resource strain: None  . Food insecurity - worry: None  . Food insecurity - inability: None  . Transportation needs - medical: None  . Transportation needs - non-medical: None  Occupational History  . Occupation: Psychologist, occupational  Tobacco Use  . Smoking status: Former Smoker    Packs/day: 0.50    Years: 8.00     Pack years: 4.00    Types: Cigarettes    Last attempt to quit: 04/25/2011    Years since quitting: 5.9  . Smokeless tobacco: Never Used  Substance and Sexual Activity  . Alcohol use: No  . Drug use: No  . Sexual activity: Not Currently    Birth control/protection: None    Comment: stopped smoking in jan.. had a pack the other day  Other Topics Concern  . None  Social History Narrative  . None   Outpatient Encounter Medications as of 04/09/2017  Medication Sig  . albuterol (PROVENTIL HFA;VENTOLIN HFA) 108 (90 Base) MCG/ACT inhaler Inhale 2 puffs into the lungs every 4 (four) hours as needed for wheezing or shortness of breath.  Marland Kitchen buPROPion (WELLBUTRIN XL) 300 MG 24 hr tablet Take 300 mg by mouth every morning.   . busPIRone (BUSPAR) 10 MG tablet Take 15 mg by mouth 2 (two) times daily. For anxiety  . cetirizine (ZYRTEC) 10 MG tablet Take 10 mg by mouth 2 (two) times daily.  . Cholecalciferol (VITAMIN D3) 5000 units CAPS Take 10,000 Units by mouth daily.   . diphenhydrAMINE (BENADRYL) 25 MG tablet Take 25 mg by mouth daily as needed for itching.   . fluticasone (FLOVENT HFA) 110 MCG/ACT inhaler Inhale 2 puffs into the lungs 2 (two) times daily.  . montelukast (SINGULAIR) 10 MG tablet Take 1 tablet (10 mg total) by mouth at bedtime.  . Prenatal Vit-Fe Fumarate-FA (PRENATAL MULTIVITAMIN) TABS Take 1 tablet by mouth daily.   . vitamin E 400 UNIT capsule Take 400 Units by mouth daily.  . [DISCONTINUED] amLODipine (NORVASC) 10 MG tablet Take 1 tablet (10 mg total) by mouth daily. (Patient not taking: Reported on 03/27/2017)  . [DISCONTINUED] oxyCODONE-acetaminophen (PERCOCET/ROXICET) 5-325 MG tablet Take 1 tablet by mouth every 6 (six) hours as needed for severe pain. (Patient not taking: Reported on 03/05/2017)  . [DISCONTINUED] pantoprazole (PROTONIX) 40 MG tablet TAKE 1 TABLET BY MOUTH DAILY (Patient not taking: Reported on 03/05/2017)  . [DISCONTINUED] senna-docusate (SENOKOT-S) 8.6-50 MG  tablet Take 2 tablets by mouth daily. (Patient not taking: Reported on 03/27/2017)  . [DISCONTINUED] triamcinolone ointment (KENALOG) 0.1 % Apply 1 application topically 2 (two) times daily. (Patient not taking: Reported on 03/05/2017)   Facility-Administered Encounter Medications as of 04/09/2017  Medication  . [COMPLETED] ipratropium (ATROVENT) nebulizer solution 0.5 mg   ALLERGIES: Allergies  Allergen Reactions  . Bee Venom Shortness Of Breath  . Penicillins Anaphylaxis    Has patient had a PCN reaction causing immediate rash, facial/tongue/throat swelling, SOB or lightheadedness with hypotension: No Has patient had a PCN reaction causing severe rash involving mucus membranes or skin necrosis: No Has patient had a PCN reaction that required hospitalization No Has patient had a PCN reaction occurring within the last 10 years: No If all of the above answers are "NO",  then may proceed with Cephalosporin use.'  REACTION: Angioedema  . Adhesive [Tape] Rash  . Latex Rash  . Vancomycin Other (See Comments)    Pt can tolerate Vancomycin but did cause Red-Man Syndrome.  Recommend to pre-medicate with Benadryl before doses administered.     VACCINATION STATUS: Immunization History  Administered Date(s) Administered  . H1N1 03/08/2008  . Influenza Whole 04/28/2006, 01/21/2008  . Influenza,inj,Quad PF,6+ Mos 02/20/2016, 12/20/2016  . Pneumococcal Polysaccharide-23 03/08/2008  . Tdap 01/02/2017   ROS: Constitutional: +  weight loss,  + fatigue, no subjective hyperthermia, no subjective hypothermia Eyes: no blurry vision, no xerophthalmia ENT: no sore throat, no nodules palpated in throat, no dysphagia/odynophagia, no hoarseness Cardiovascular: No chest pain, no shortness of breath, no palpitations, no leg swelling. Respiratory: no cough, no SOB Gastrointestinal: no Nausea/Vomiting/Diarhhea Musculoskeletal: no muscle/joint aches Skin: no rashes Neurological: no tremors, no numbness, no  tingling, no dizziness Psychiatric: no depression, no anxiety   PE: Ht 5\' 3"  (1.6 m)   Wt (!) 409 lb (185.5 kg)   BMI 72.45 kg/m  Wt Readings from Last 3 Encounters:  04/09/17 (!) 409 lb (185.5 kg)  03/27/17 (!) 411 lb (186.4 kg)  03/05/17 (!) 407 lb 3.2 oz (184.7 kg)   Constitutional:  + Morbid obesity, not in acute distress, normal state of mind Eyes: PERRLA, EOMI, no exophthalmos ENT: + Edentulous, moist mucous membranes, no thyromegaly, no cervical lymphadenopathy Cardiovascular: normal precordial activity, Regular Rate and Rhythm, no Murmur/Rubs/Gallops Respiratory:  adequate breathing efforts, no gross chest deformity, Clear to auscultation bilaterally Gastrointestinal: +abdomen is obese, soft, Non -tender,  Bowel Sounds present Musculoskeletal: + Large extremities,  no gross edema , strength intact in all four extremities Skin: moist, warm, no rashes Neurological: no tremor with outstretched hands, Deep tendon reflexes normal in all four extremities.  CMP     Component Value Date/Time   NA 138 02/03/2017 1128   NA 138 01/20/2017 1137   K 4.0 02/03/2017 1128   CL 98 (L) 01/31/2017 1120   CO2 27 01/31/2017 1120   GLUCOSE 85 01/31/2017 1120   BUN 14 01/31/2017 1120   BUN 10 01/20/2017 1137   CREATININE 0.62 02/03/2017 1455   CREATININE 0.70 07/14/2014 1451   CALCIUM 9.0 01/31/2017 1120   PROT 6.9 01/31/2017 1120   PROT 6.7 01/20/2017 1137   ALBUMIN 2.7 (L) 01/31/2017 1120   ALBUMIN 3.6 01/20/2017 1137   AST 16 01/31/2017 1120   ALT 13 (L) 01/31/2017 1120   ALKPHOS 131 (H) 01/31/2017 1120   BILITOT 0.2 (L) 01/31/2017 1120   BILITOT <0.2 01/20/2017 1137   GFRNONAA >60 02/03/2017 1455   GFRAA >60 02/03/2017 1455   A1c was 5.3% on 01/13/2017.    ASSESSMENT: 1. Morbid Obesity   PLAN: - Based on her reported and documented history,  she has been overweight/obese for most of her adult life. She was able to lose approximately 100 pounds , down to 398 pounds, prior  to her successful conception, and she is status post cesarean delivery on 02/03/2017 was 2 baby girls. - Her delivery was completed by preeclampsia. - Suspicion for any endocrine cause a weight gain is low at this time and the likely etiology for rapid weight gain is still excessive calorie Intake. - She has normal thyroid function test, A1c is 5.3%. - Now that pregnancy is over, she is a good candidate for other options of weight loss. - Based option would be bariatric surgery. - I discussed and gave her  a brochure and contact information for [facility] bariatric team. - caloric restriction still very important modality of therapy for weight control in her case.  I discussed diet in detail once again, reviewing each of her food choices and advised for healthy alternatives. -Suggestion is made for her to avoid simple carbohydrates  from her diet including Cakes, Sweet Desserts, Ice Cream, Soda (diet and regular), Sweet Tea, doughnuts, pancakes, pizza, Candies, Chips, Cookies, Store Bought Juices, Alcohol, Artificial Sweeteners, and "Sugar-free" Products.  - I encouraged her to switch to  unprocessed or minimally processed complex starch and increased protein intake (animal or plant source), fruits, and vegetables.  - She will return in 6 months for revaluation. - I have advised her to stay off of metformin for now.  -  I advised her to maintain [place] follow up with her OB/GYN provider Dr. Glo Herring.   Glade Lloyd, MD [facility]  This note was partially dictated with voice recognition software. Similar sounding words can be transcribed inadequately or may not  be corrected upon review.

## 2017-04-16 DIAGNOSIS — E669 Obesity, unspecified: Secondary | ICD-10-CM | POA: Diagnosis not present

## 2017-04-16 DIAGNOSIS — G44209 Tension-type headache, unspecified, not intractable: Secondary | ICD-10-CM | POA: Diagnosis not present

## 2017-04-17 NOTE — Telephone Encounter (Signed)
I called patient to check on her and make sure her breathing was stable. Left voicemail asking her to call us back. She does have a follow up appointment scheduled in February.  Salvatore Marvel, MD [facility]

## 2017-04-30 ENCOUNTER — Other Ambulatory Visit: Payer: Self-pay | Admitting: Allergy & Immunology

## 2017-05-01 NOTE — Telephone Encounter (Signed)
Courtesy refill  

## 2017-05-02 DIAGNOSIS — F332 Major depressive disorder, recurrent severe without psychotic features: Secondary | ICD-10-CM | POA: Diagnosis not present

## 2017-05-12 DIAGNOSIS — F332 Major depressive disorder, recurrent severe without psychotic features: Secondary | ICD-10-CM | POA: Diagnosis not present

## 2017-05-19 DIAGNOSIS — F332 Major depressive disorder, recurrent severe without psychotic features: Secondary | ICD-10-CM | POA: Diagnosis not present

## 2017-05-22 DIAGNOSIS — M545 Low back pain: Secondary | ICD-10-CM | POA: Diagnosis not present

## 2017-05-22 DIAGNOSIS — M5441 Lumbago with sciatica, right side: Secondary | ICD-10-CM | POA: Diagnosis not present

## 2017-05-23 NOTE — Telephone Encounter (Signed)
Called patient and left message to call office back. Patient did return call and stated she has been doing well.  Patient has an appointment 05/27/17 with Dr. Ernst Bowler and she confirmed she will be at appointment. Dr. Ernst Bowler was notified.

## 2017-05-27 ENCOUNTER — Ambulatory Visit (INDEPENDENT_AMBULATORY_CARE_PROVIDER_SITE_OTHER): Payer: Medicare HMO | Admitting: Allergy & Immunology

## 2017-05-27 ENCOUNTER — Encounter: Payer: Self-pay | Admitting: Allergy & Immunology

## 2017-05-27 VITALS — BP 128/78 | HR 72 | Resp 20

## 2017-05-27 DIAGNOSIS — J4530 Mild persistent asthma, uncomplicated: Secondary | ICD-10-CM | POA: Diagnosis not present

## 2017-05-27 DIAGNOSIS — J3089 Other allergic rhinitis: Secondary | ICD-10-CM | POA: Diagnosis not present

## 2017-05-27 MED ORDER — ALBUTEROL SULFATE HFA 108 (90 BASE) MCG/ACT IN AERS: 2.0000 | INHALATION_SPRAY | RESPIRATORY_TRACT | 1 refills | Status: DC | PRN

## 2017-05-27 MED ORDER — MONTELUKAST SODIUM 10 MG PO TABS: 10.0000 mg | ORAL_TABLET | Freq: Every day | ORAL | 5 refills | Status: DC

## 2017-05-27 MED ORDER — CETIRIZINE HCL 10 MG PO TABS: 10.0000 mg | ORAL_TABLET | Freq: Two times a day (BID) | ORAL | 5 refills | Status: DC

## 2017-05-27 MED ORDER — FLUTICASONE PROPIONATE HFA 110 MCG/ACT IN AERO: 2.0000 | INHALATION_SPRAY | Freq: Two times a day (BID) | RESPIRATORY_TRACT | 5 refills | Status: DC
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
## 2017-05-27 NOTE — Patient Instructions (Addendum)
1. Mild persistent asthma - Daily controller medication(s): Flovent 123mcg two puffs twice daily + Singulair 10mg  daily - Rescue medications: ProAir 2 puffs every 4 hours as needed - Changes during respiratory infections or worsening symptoms: increase Flovent 190mcg to 4 puffs twice daily for TWO WEEKS. - Asthma control goals:  * Full participation in all desired activities (may need albuterol before activity) * Albuterol use two time or less a week on average (not counting use with activity) * Cough interfering with sleep two time or less a month * Oral steroids no more than once a year * No hospitalizations   2. Perennial allergic rhinitis - Continue with nasal saline rinses 1-2 times daily. - Zyrtec (cetirizine) daily as needed.  3. Return in about 6 months (around 11/24/2017). I am so happy with how well you are doing!    Please inform us of any Emergency Department visits, hospitalizations, or changes in symptoms. Call us before going to the ED for breathing or allergy symptoms since we might be able to fit you in for a sick visit. Feel free to contact us anytime with any questions, problems, or concerns.  It was a pleasure to see you and your family again today!   Websites that have reliable patient information: 1. American Academy of Asthma, Allergy, and Immunology: www.aaaai.org 2. Food Allergy Research and Education (FARE): foodallergy.org 3. Mothers of Asthmatics: http://www.asthmacommunitynetwork.org 4. American College of Allergy, Asthma, and Immunology: www.acaai.org

## 2017-05-29 DIAGNOSIS — F329 Major depressive disorder, single episode, unspecified: Secondary | ICD-10-CM | POA: Diagnosis not present

## 2017-05-29 DIAGNOSIS — G44209 Tension-type headache, unspecified, not intractable: Secondary | ICD-10-CM | POA: Diagnosis not present

## 2017-05-29 DIAGNOSIS — E669 Obesity, unspecified: Secondary | ICD-10-CM | POA: Diagnosis not present

## 2017-06-06 DIAGNOSIS — M542 Cervicalgia: Secondary | ICD-10-CM | POA: Diagnosis not present

## 2017-06-06 DIAGNOSIS — M9903 Segmental and somatic dysfunction of lumbar region: Secondary | ICD-10-CM | POA: Diagnosis not present

## 2017-06-06 DIAGNOSIS — M546 Pain in thoracic spine: Secondary | ICD-10-CM | POA: Diagnosis not present

## 2017-06-06 DIAGNOSIS — M9902 Segmental and somatic dysfunction of thoracic region: Secondary | ICD-10-CM | POA: Diagnosis not present

## 2017-06-06 DIAGNOSIS — M9901 Segmental and somatic dysfunction of cervical region: Secondary | ICD-10-CM | POA: Diagnosis not present

## 2017-06-06 DIAGNOSIS — M5441 Lumbago with sciatica, right side: Secondary | ICD-10-CM | POA: Diagnosis not present

## 2017-06-09 DIAGNOSIS — M9901 Segmental and somatic dysfunction of cervical region: Secondary | ICD-10-CM | POA: Diagnosis not present

## 2017-06-09 DIAGNOSIS — M9903 Segmental and somatic dysfunction of lumbar region: Secondary | ICD-10-CM | POA: Diagnosis not present

## 2017-06-09 DIAGNOSIS — M9902 Segmental and somatic dysfunction of thoracic region: Secondary | ICD-10-CM | POA: Diagnosis not present

## 2017-06-09 DIAGNOSIS — M546 Pain in thoracic spine: Secondary | ICD-10-CM | POA: Diagnosis not present

## 2017-06-09 DIAGNOSIS — M542 Cervicalgia: Secondary | ICD-10-CM | POA: Diagnosis not present

## 2017-06-09 DIAGNOSIS — M5441 Lumbago with sciatica, right side: Secondary | ICD-10-CM | POA: Diagnosis not present

## 2017-06-11 DIAGNOSIS — M542 Cervicalgia: Secondary | ICD-10-CM | POA: Diagnosis not present

## 2017-06-11 DIAGNOSIS — M9902 Segmental and somatic dysfunction of thoracic region: Secondary | ICD-10-CM | POA: Diagnosis not present

## 2017-06-11 DIAGNOSIS — M9903 Segmental and somatic dysfunction of lumbar region: Secondary | ICD-10-CM | POA: Diagnosis not present

## 2017-06-11 DIAGNOSIS — M9901 Segmental and somatic dysfunction of cervical region: Secondary | ICD-10-CM | POA: Diagnosis not present

## 2017-06-11 DIAGNOSIS — M5441 Lumbago with sciatica, right side: Secondary | ICD-10-CM | POA: Diagnosis not present

## 2017-06-11 DIAGNOSIS — M546 Pain in thoracic spine: Secondary | ICD-10-CM | POA: Diagnosis not present

## 2017-06-20 DIAGNOSIS — M9903 Segmental and somatic dysfunction of lumbar region: Secondary | ICD-10-CM | POA: Diagnosis not present

## 2017-06-20 DIAGNOSIS — M5441 Lumbago with sciatica, right side: Secondary | ICD-10-CM | POA: Diagnosis not present

## 2017-06-20 DIAGNOSIS — M542 Cervicalgia: Secondary | ICD-10-CM | POA: Diagnosis not present

## 2017-06-20 DIAGNOSIS — M9902 Segmental and somatic dysfunction of thoracic region: Secondary | ICD-10-CM | POA: Diagnosis not present

## 2017-06-20 DIAGNOSIS — M546 Pain in thoracic spine: Secondary | ICD-10-CM | POA: Diagnosis not present

## 2017-06-20 DIAGNOSIS — M9901 Segmental and somatic dysfunction of cervical region: Secondary | ICD-10-CM | POA: Diagnosis not present

## 2017-06-30 DIAGNOSIS — F332 Major depressive disorder, recurrent severe without psychotic features: Secondary | ICD-10-CM | POA: Diagnosis not present

## 2017-07-13 ENCOUNTER — Emergency Department (HOSPITAL_COMMUNITY)
Admission: EM | Admit: 2017-07-13 | Discharge: 2017-07-13 | Disposition: A | Discharge status: Home / Self Care | Payer: Medicare HMO | Attending: Emergency Medicine | Admitting: Emergency Medicine

## 2017-07-13 ENCOUNTER — Encounter (HOSPITAL_COMMUNITY): Payer: Self-pay | Admitting: Emergency Medicine

## 2017-07-13 ENCOUNTER — Other Ambulatory Visit: Payer: Self-pay

## 2017-07-13 DIAGNOSIS — Z91040 Latex allergy status: Secondary | ICD-10-CM | POA: Insufficient documentation

## 2017-07-13 DIAGNOSIS — N390 Urinary tract infection, site not specified: Secondary | ICD-10-CM | POA: Diagnosis not present

## 2017-07-13 DIAGNOSIS — I10 Essential (primary) hypertension: Secondary | ICD-10-CM | POA: Insufficient documentation

## 2017-07-13 DIAGNOSIS — J4530 Mild persistent asthma, uncomplicated: Secondary | ICD-10-CM | POA: Insufficient documentation

## 2017-07-13 DIAGNOSIS — E119 Type 2 diabetes mellitus without complications: Secondary | ICD-10-CM | POA: Diagnosis not present

## 2017-07-13 DIAGNOSIS — Z87891 Personal history of nicotine dependence: Secondary | ICD-10-CM | POA: Insufficient documentation

## 2017-07-13 DIAGNOSIS — Z79899 Other long term (current) drug therapy: Secondary | ICD-10-CM | POA: Diagnosis not present

## 2017-07-13 DIAGNOSIS — J020 Streptococcal pharyngitis: Secondary | ICD-10-CM | POA: Insufficient documentation

## 2017-07-13 DIAGNOSIS — R0602 Shortness of breath: Secondary | ICD-10-CM | POA: Diagnosis present

## 2017-07-13 LAB — URINALYSIS, ROUTINE W REFLEX MICROSCOPIC
BILIRUBIN URINE: NEGATIVE
Glucose, UA: NEGATIVE mg/dL
Ketones, ur: NEGATIVE mg/dL
Nitrite: NEGATIVE
PROTEIN: NEGATIVE mg/dL
Specific Gravity, Urine: 1.016 (ref 1.005–1.030)
pH: 6 (ref 5.0–8.0)

## 2017-07-13 LAB — RAPID STREP SCREEN (MED CTR MEBANE ONLY): Streptococcus, Group A Screen (Direct): POSITIVE — AB

## 2017-07-13 MED ORDER — CEFPODOXIME PROXETIL 100 MG PO TABS: 100.0000 mg | ORAL_TABLET | Freq: Two times a day (BID) | ORAL | 0 refills | Status: DC

## 2017-07-13 NOTE — ED Provider Notes (Signed)
[facility] EMERGENCY DEPARTMENT Provider Note   CSN: [id number] Arrival date & time: 07/13/17  1440     History   Chief Complaint Chief Complaint  Patient presents with  . Shortness of Breath    HPI Heather [name] is a 42 y.o. female.  43 year old female complaining of 4-5 days of nasal congestion trouble breathing through her nose sore throat headaches.  She has a history of allergies and history of asthma COPD among her multiple medical problems.  She does not think she is had a fever but she states she is felt some chills.  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  She has been using her Singulair and Zyrtec and inhalers with no real relief.  The history is provided by the patient.  Sore Throat  This is a new problem. The current episode started more than 2 days ago. The problem occurs constantly. The problem has not changed since onset.Associated symptoms include headaches and shortness of breath. Pertinent negatives include no chest pain and no abdominal pain. The symptoms are aggravated by swallowing. Nothing relieves the symptoms. She has tried acetaminophen for the symptoms. The treatment provided no relief.    Past Medical History:  Diagnosis Date  . Amenorrhea   . Anxiety   . Anxiety   . Asthma   . COPD (chronic obstructive pulmonary disease) ([place])   . Delivery with history of C-section   . Depression   . Depression   . Diabetes mellitus without complication ([place])   . Dysmenorrhea   . Dysrhythmia    DR Johnsie Cancel    . Ectopic pregnancy 2013  . Eosinophilic esophagitis    Diagnosed at [facility] 06/16/2013, untreated  . GERD (gastroesophageal reflux disease)    HEARTBURN   TUMS  . Hard to intubate 11/07/2015  . Leukocytosis 07/28/2008   Qualifier: Diagnosis of  By: Jonna Munro MD, Roderic Scarce    . Morbid obesity ([place])   . Neuromuscular disorder (HCC)    RESTLESS LEG   . Obesity   . Schizoaffective disorder, bipolar type ([place])   . Sepsis  ([place]) 11/11/2014  . Shortness of breath    WITH EXERTION   . Sleep apnea    CPAP    Patient Active Problem List   Diagnosis Date Noted  . Nexplanon insertion 03/27/2017  . Postpartum hypertension 03/05/2017  . Status post primary low transverse cesarean section 02/03/2017  . H/O pre-eclampsia 01/27/2017  . Gestational htn w/o significant proteinuria, third trimester 01/20/2017  . Mild persistent asthma without complication 82/50/0370  . BMI 70 and over, adult ([place]) 12/15/2016  . Perennial allergic rhinitis 11/05/2016  . Mild persistent asthma with acute exacerbation 11/05/2016  . Excess weight gain in pregnancy, second trimester 10/30/2016  . Anxiety and depression 11/07/2015  . Hard to intubate 11/07/2015  . Hypoglycemia 11/07/2015  . OSA (obstructive sleep apnea) 11/07/2015  . DM type 2 (diabetes mellitus, type 2) ([place]) 12/07/2014  . Panniculitis 12/06/2014  . Cellulitis, abdominal wall 11/11/2014  . Abdominal pain 07/14/2014  . Loose stools 07/14/2014  . Melena 01/27/2014  . Eosinophilic esophagitis 48/88/9169  . Bowel habit changes 04/28/2013  . Esophageal dysphagia 04/28/2013  . Insomnia due to mental disorder(327.02) 08/08/2011  . RLS (restless legs syndrome) 08/08/2011  . Adjustment disorder with depressed mood 08/06/2011  . Schizoaffective disorder, bipolar type ([place]) 08/02/2011    Class: Acute  . PALPITATIONS, OCCASIONAL 11/01/2009  . Bipolar disorder ([place]) 10/19/2008  . DISORDER, TOBACCO  USE 08/25/2008  . Leukocytosis 07/28/2008  . ALLERGIC RHINITIS, SEASONAL 08/20/2007  . DYSMETABOLIC SYNDROME 24/40/1027  . Morbid obesity ([place]) 05/13/2006  . EXTERNAL HEMORRHOIDS 05/13/2006  . HYPERLIPIDEMIA 05/12/2006  . Essential hypertension 05/12/2006  . ASTHMA 05/12/2006  . GERD (gastroesophageal reflux disease) 05/12/2006  . OSTEOARTHRITIS 05/12/2006    Past Surgical History:  Procedure Laterality Date  . CESAREAN SECTION MULTI-GESTATIONAL N/A 02/03/2017    Procedure: CESAREAN SECTION MULTI-GESTATIONAL;  Surgeon: Jonnie Kind, MD;  Location: [facility];  Service: Obstetrics;  Laterality: N/A;  . CHOLECYSTECTOMY    . DENTAL SURGERY    . ESOPHAGOGASTRODUODENOSCOPY  May 2007   Dr. Gala Romney: Normal esophagus, stomach, D1, D2  . ESOPHAGOGASTRODUODENOSCOPY  06/16/2013   Dr. Carlton Adam, eosinophilic esophagitis, reactive gastropathy, no esophageal dilation  . TONSILLECTOMY    . TOOTH EXTRACTION  10/28/2011   Procedure: DENTAL RESTORATION/EXTRACTIONS;  Surgeon: Gae Bon, DDS;  Location: [facility] OR;  Service: Oral Surgery;;     OB History    Gravida  2   Para  1   Term  1   Preterm      AB  1   Living  2     SAB  0   TAB      Ectopic  1   Multiple  1   Live Births  2            Home Medications    Prior to Admission medications   Medication Sig Start Date End Date Taking? Authorizing Provider  acetaminophen (TYLENOL) 500 MG tablet Take by mouth.    [provider]  albuterol (PROVENTIL HFA;VENTOLIN HFA) 108 (90 Base) MCG/ACT inhaler Inhale 2 puffs into the lungs every 4 (four) hours as needed for wheezing or shortness of breath. 05/27/17   Valentina Shaggy, MD  buPROPion (WELLBUTRIN XL) 300 MG 24 hr tablet Take 300 mg by mouth every morning.  04/28/14   [provider]  busPIRone (BUSPAR) 10 MG tablet Take 15 mg by mouth 2 (two) times daily. For anxiety    [provider]  cetirizine (ZYRTEC) 10 MG tablet Take 1 tablet (10 mg total) by mouth 2 (two) times daily. 05/27/17   Valentina Shaggy, MD  Cholecalciferol (VITAMIN D3) 5000 units CAPS Take 10,000 Units by mouth daily.     [provider]  diphenhydrAMINE (BENADRYL) 25 MG tablet Take 25 mg by mouth daily as needed for itching.     [provider]  fluticasone (FLOVENT HFA) 110 MCG/ACT inhaler Inhale 2 puffs into the lungs 2 (two) times daily. 05/27/17   Valentina Shaggy, MD  montelukast (SINGULAIR) 10 MG tablet  Take 1 tablet (10 mg total) by mouth at bedtime. 05/27/17   Valentina Shaggy, MD  predniSONE (STERAPRED UNI-PAK 21 TAB) 10 MG (21) TBPK tablet FPD 05/22/17   [provider]  Prenatal Vit-Fe Fumarate-FA (PRENATAL MULTIVITAMIN) TABS Take 1 tablet by mouth daily.     [provider]  rOPINIRole (REQUIP) 3 MG tablet TK 1 T PO QHS 05/01/17   [provider]  tiZANidine (ZANAFLEX) 4 MG tablet TK 1 T PO Q 8 H PRF PAIN OR SPASM 05/22/17   [provider]  vitamin E 400 UNIT capsule Take 400 Units by mouth daily.    [provider]  fluticasone (FLONASE) 50 MCG/ACT nasal spray INHALE 1 TO 2 SPRAYS IN EACH NOSTRIL DAILY Patient not taking: Reported on 04/26/2015 02/20/15 04/26/15  Gean Quint, MD  Family History Family History  Problem Relation Age of Onset  . Depression Mother   . Anxiety disorder Mother   . Hypertension Sister   . Allergic rhinitis Sister   . Colon polyps Maternal Grandmother        10s  . Diabetes Maternal Grandmother   . Anxiety disorder Maternal Grandmother   . COPD Maternal Grandmother   . Crohn's disease Maternal Aunt   . Cancer Maternal Grandfather        prostate  . HIV/AIDS Father   . Liver disease Neg Hx   . Angioedema Neg Hx   . Eczema Neg Hx   . Immunodeficiency Neg Hx   . Asthma Neg Hx   . Urticaria Neg Hx     Social History Social History   Tobacco Use  . Smoking status: Former Smoker    Packs/day: 0.50    Years: 8.00    Pack years: 4.00    Types: Cigarettes    Last attempt to quit: 04/25/2011    Years since quitting: 6.2  . Smokeless tobacco: Never Used  Substance Use Topics  . Alcohol use: No  . Drug use: No     Allergies   Bee venom; Penicillins; Adhesive [tape]; Latex; and Vancomycin   Review of Systems Review of Systems  Constitutional: Positive for chills. Negative for fever.  HENT: Positive for rhinorrhea, sinus pressure, sinus pain, sore throat and trouble swallowing. Negative  for ear pain.   Eyes: Negative for pain and discharge.  Respiratory: Positive for shortness of breath and wheezing.   Cardiovascular: Negative for chest pain.  Gastrointestinal: Negative for abdominal pain, diarrhea and vomiting.  Genitourinary: Negative for dysuria.  Musculoskeletal: Negative for back pain.  Skin: Negative for rash.  Neurological: Positive for headaches. Negative for weakness and numbness.     Physical Exam Updated Vital Signs BP (!) 156/86 (BP Location: Right Arm)   Pulse (!) 109   Temp 97.8 F (36.6 C) (Oral)   Resp (!) 24   Wt (!) 196 kg (432 lb 3 oz)   SpO2 99%   BMI 76.56 kg/m   Physical Exam  Constitutional: She appears well-developed and well-nourished.  HENT:  Head: Normocephalic and atraumatic.  Right Ear: Tympanic membrane, external ear and ear canal normal.  Left Ear: Tympanic membrane, external ear and ear canal normal.  Nose: Mucosal edema present. Right sinus exhibits maxillary sinus tenderness and frontal sinus tenderness. Left sinus exhibits maxillary sinus tenderness and frontal sinus tenderness.  Mouth/Throat: Oropharynx is clear and moist. No oropharyngeal exudate or posterior oropharyngeal edema.  Eyes: Pupils are equal, round, and reactive to light. EOM are normal.  Neck: Normal range of motion. Neck supple.  Cardiovascular: Normal rate and regular rhythm.  Pulmonary/Chest: Effort normal. She has wheezes (few scattered).  Abdominal: Soft. There is no tenderness. There is no guarding.  Musculoskeletal: She exhibits no deformity.  Neurological: She is alert.  Skin: Skin is warm and dry.     ED Treatments / Results  Labs (all labs ordered are listed, but only abnormal results are displayed) Labs Reviewed  RAPID STREP SCREEN (NOT AT [facility]) - Abnormal; Notable for the following components:      Result Value   Streptococcus, Group A Screen (Direct) POSITIVE (*)    All other components within normal limits  URINALYSIS, ROUTINE W REFLEX  MICROSCOPIC - Abnormal; Notable for the following components:   APPearance CLOUDY (*)    Hgb urine dipstick MODERATE (*)    Leukocytes,  UA LARGE (*)    Bacteria, UA RARE (*)    Squamous Epithelial / LPF 0-5 (*)    All other components within normal limits    EKG None  Radiology No results found.  Procedures Procedures (including critical care time)  Medications Ordered in ED Medications - No data to display   Initial Impression / Assessment and Plan / ED Course  I have reviewed the triage vital signs and the nursing notes.  Pertinent labs & imaging results that were available during my care of the patient were reviewed by me and considered in my medical decision making (see chart for details).  Clinical Course as of Jul 15 1155  Nancy Fetter Jul 13, 2017  1640 Patient was positive for strep and also gave a urine sample and she is got large leuks.  She does not endorse really urine symptoms but states she does get back aches.  She is pen allergic so we will cover her with a cephalosporin.   [MB]    Clinical Course User Index [MB] Hayden Rasmussen, MD    Final Clinical Impressions(s) / ED Diagnoses   Final diagnoses:  Strep throat  Lower urinary tract infection, acute    ED Discharge Orders        Ordered    cefpodoxime (VANTIN) 100 MG tablet  2 times daily     07/13/17 1643       Hayden Rasmussen, MD 07/14/17 1157

## 2017-07-13 NOTE — ED Triage Notes (Signed)
Pt states that she has been having headaches congestion she feels like her throat is closing and sob for the past couple of days

## 2017-07-13 NOTE — Discharge Instructions (Addendum)
Your evaluated in the emergency department for a sore throat and shortness of breath.  You were found to be positive for strep throat and you also have a urine infection.  We are prescribing you an antibiotic to take and you should finish the medication.  Continue to take Tylenol and ibuprofen for pain, warm salt water gargles may also help.  Follow-up with your doctor and return if any worsening symptoms.

## 2017-07-15 DIAGNOSIS — H81393 Other peripheral vertigo, bilateral: Secondary | ICD-10-CM | POA: Diagnosis not present

## 2017-07-15 DIAGNOSIS — F3341 Major depressive disorder, recurrent, in partial remission: Secondary | ICD-10-CM | POA: Diagnosis not present

## 2017-07-15 DIAGNOSIS — G44209 Tension-type headache, unspecified, not intractable: Secondary | ICD-10-CM | POA: Diagnosis not present

## 2017-08-25 ENCOUNTER — Other Ambulatory Visit (HOSPITAL_COMMUNITY): Payer: Self-pay | Admitting: *Deleted

## 2017-08-25 DIAGNOSIS — D72829 Elevated white blood cell count, unspecified: Secondary | ICD-10-CM

## 2017-08-26 ENCOUNTER — Inpatient Hospital Stay (HOSPITAL_COMMUNITY): Payer: Medicare HMO

## 2017-08-29 DIAGNOSIS — M130 Polyarthritis, unspecified: Secondary | ICD-10-CM | POA: Diagnosis not present

## 2017-08-29 DIAGNOSIS — Z0000 Encounter for general adult medical examination without abnormal findings: Secondary | ICD-10-CM | POA: Diagnosis not present

## 2017-08-29 DIAGNOSIS — Z0279 Encounter for issue of other medical certificate: Secondary | ICD-10-CM | POA: Diagnosis not present

## 2017-09-02 DIAGNOSIS — M545 Low back pain: Secondary | ICD-10-CM | POA: Diagnosis not present

## 2017-09-02 DIAGNOSIS — M5441 Lumbago with sciatica, right side: Secondary | ICD-10-CM | POA: Diagnosis not present

## 2017-09-23 DIAGNOSIS — F332 Major depressive disorder, recurrent severe without psychotic features: Secondary | ICD-10-CM | POA: Diagnosis not present

## 2017-10-07 ENCOUNTER — Ambulatory Visit: Payer: Medicare HMO | Admitting: "Endocrinology

## 2017-11-01 DIAGNOSIS — Z6841 Body Mass Index (BMI) 40.0 and over, adult: Secondary | ICD-10-CM | POA: Diagnosis not present

## 2017-11-01 DIAGNOSIS — M130 Polyarthritis, unspecified: Secondary | ICD-10-CM | POA: Diagnosis not present

## 2017-11-01 DIAGNOSIS — F064 Anxiety disorder due to known physiological condition: Secondary | ICD-10-CM | POA: Diagnosis not present

## 2017-12-13 DIAGNOSIS — J069 Acute upper respiratory infection, unspecified: Secondary | ICD-10-CM | POA: Diagnosis not present

## 2017-12-13 DIAGNOSIS — Z6841 Body Mass Index (BMI) 40.0 and over, adult: Secondary | ICD-10-CM | POA: Diagnosis not present

## 2017-12-13 DIAGNOSIS — F329 Major depressive disorder, single episode, unspecified: Secondary | ICD-10-CM | POA: Diagnosis not present

## 2017-12-13 DIAGNOSIS — R7301 Impaired fasting glucose: Secondary | ICD-10-CM | POA: Diagnosis not present

## 2017-12-13 DIAGNOSIS — N6452 Nipple discharge: Secondary | ICD-10-CM | POA: Diagnosis not present

## 2017-12-17 DIAGNOSIS — F332 Major depressive disorder, recurrent severe without psychotic features: Secondary | ICD-10-CM | POA: Diagnosis not present

## 2018-01-08 DIAGNOSIS — M9902 Segmental and somatic dysfunction of thoracic region: Secondary | ICD-10-CM | POA: Diagnosis not present

## 2018-01-08 DIAGNOSIS — M542 Cervicalgia: Secondary | ICD-10-CM | POA: Diagnosis not present

## 2018-01-08 DIAGNOSIS — M9903 Segmental and somatic dysfunction of lumbar region: Secondary | ICD-10-CM | POA: Diagnosis not present

## 2018-01-08 DIAGNOSIS — M5441 Lumbago with sciatica, right side: Secondary | ICD-10-CM | POA: Diagnosis not present

## 2018-01-08 DIAGNOSIS — M546 Pain in thoracic spine: Secondary | ICD-10-CM | POA: Diagnosis not present

## 2018-01-08 DIAGNOSIS — M9901 Segmental and somatic dysfunction of cervical region: Secondary | ICD-10-CM | POA: Diagnosis not present

## 2018-01-10 DIAGNOSIS — F329 Major depressive disorder, single episode, unspecified: Secondary | ICD-10-CM | POA: Diagnosis not present

## 2018-01-10 DIAGNOSIS — E668 Other obesity: Secondary | ICD-10-CM | POA: Diagnosis not present

## 2018-01-10 DIAGNOSIS — Z6841 Body Mass Index (BMI) 40.0 and over, adult: Secondary | ICD-10-CM | POA: Diagnosis not present

## 2018-01-19 DIAGNOSIS — M25551 Pain in right hip: Secondary | ICD-10-CM | POA: Diagnosis not present

## 2018-01-19 DIAGNOSIS — M5441 Lumbago with sciatica, right side: Secondary | ICD-10-CM | POA: Diagnosis not present

## 2018-01-19 DIAGNOSIS — M545 Low back pain: Secondary | ICD-10-CM | POA: Diagnosis not present

## 2018-01-20 ENCOUNTER — Other Ambulatory Visit: Payer: Self-pay | Admitting: Orthopedic Surgery

## 2018-01-20 DIAGNOSIS — M545 Low back pain, unspecified: Secondary | ICD-10-CM

## 2018-01-24 ENCOUNTER — Ambulatory Visit
Admission: RE | Admit: 2018-01-24 | Discharge: 2018-01-24 | Disposition: A | Discharge status: Home / Self Care | Payer: Medicare HMO | Source: Ambulatory Visit | Attending: Orthopedic Surgery | Admitting: Orthopedic Surgery

## 2018-01-24 DIAGNOSIS — M545 Low back pain, unspecified: Secondary | ICD-10-CM

## 2018-01-24 DIAGNOSIS — M48061 Spinal stenosis, lumbar region without neurogenic claudication: Secondary | ICD-10-CM | POA: Diagnosis not present

## 2018-02-03 ENCOUNTER — Emergency Department (HOSPITAL_COMMUNITY)
Admission: EM | Admit: 2018-02-03 | Discharge: 2018-02-03 | Disposition: A | Discharge status: Home / Self Care | Payer: Medicare HMO | Attending: Emergency Medicine | Admitting: Emergency Medicine

## 2018-02-03 ENCOUNTER — Other Ambulatory Visit: Payer: Self-pay

## 2018-02-03 ENCOUNTER — Encounter (HOSPITAL_COMMUNITY): Payer: Self-pay | Admitting: Emergency Medicine

## 2018-02-03 DIAGNOSIS — Z91040 Latex allergy status: Secondary | ICD-10-CM | POA: Diagnosis not present

## 2018-02-03 DIAGNOSIS — Z79899 Other long term (current) drug therapy: Secondary | ICD-10-CM | POA: Diagnosis not present

## 2018-02-03 DIAGNOSIS — Z6841 Body Mass Index (BMI) 40.0 and over, adult: Secondary | ICD-10-CM | POA: Diagnosis not present

## 2018-02-03 DIAGNOSIS — J449 Chronic obstructive pulmonary disease, unspecified: Secondary | ICD-10-CM | POA: Insufficient documentation

## 2018-02-03 DIAGNOSIS — M5440 Lumbago with sciatica, unspecified side: Secondary | ICD-10-CM | POA: Diagnosis not present

## 2018-02-03 DIAGNOSIS — Z87891 Personal history of nicotine dependence: Secondary | ICD-10-CM | POA: Diagnosis not present

## 2018-02-03 DIAGNOSIS — M5430 Sciatica, unspecified side: Secondary | ICD-10-CM | POA: Insufficient documentation

## 2018-02-03 DIAGNOSIS — M5416 Radiculopathy, lumbar region: Secondary | ICD-10-CM | POA: Diagnosis not present

## 2018-02-03 DIAGNOSIS — E119 Type 2 diabetes mellitus without complications: Secondary | ICD-10-CM | POA: Diagnosis not present

## 2018-02-03 DIAGNOSIS — I10 Essential (primary) hypertension: Secondary | ICD-10-CM | POA: Insufficient documentation

## 2018-02-03 DIAGNOSIS — M545 Low back pain: Secondary | ICD-10-CM | POA: Diagnosis not present

## 2018-02-03 MED ORDER — LIDOCAINE 5 % EX PTCH: 1.0000 | MEDICATED_PATCH | CUTANEOUS | 0 refills | Status: DC

## 2018-02-03 MED ORDER — OXYCODONE-ACETAMINOPHEN 5-325 MG PO TABS
1.0000 | ORAL_TABLET | Freq: Once | ORAL | Status: AC
  Administered 2018-02-03: 1 via ORAL
  Filled 2018-02-03: qty 1

## 2018-02-03 MED ORDER — OXYCODONE-ACETAMINOPHEN 5-325 MG PO TABS: 1.0000 | ORAL_TABLET | Freq: Four times a day (QID) | ORAL | 0 refills | Status: DC | PRN

## 2018-02-03 NOTE — ED Notes (Signed)
Pt verbalized understanding of discharge instructions and denies any further questions at this time.   

## 2018-02-03 NOTE — Discharge Instructions (Signed)
Prescription given for percocet. Take medication as directed and do not operate machinery, drive a car, or work while taking this medication as it can make you drowsy.  This medication can make you constipated.  If you come constipated please take an over-the-counter laxative.  You were also given a prescription for Lidoderm patches.  If you are unable to fill this prescription due to cost, you may try over-the-counter Salon pas.   Please make an appoint with your primary care doctor to ask about physical therapy as he may benefit from this.  Please also call the neurosurgeon that you were referred to in regards to possible spinal injections.   Return to the emergency department immediately if you experience any back pain associated with fevers, loss of control of your bowels/bladder, weakness/numbness to your legs, numbness to your groin area, inability to walk, or inability to urinate.

## 2018-02-03 NOTE — ED Triage Notes (Addendum)
Pt reports lower back pain that radiates down left leg. Reports she was seen by the spine specialist today. Denies any new injury. The patient is alert and oriented x4 with no acute distress at triage.   She reports having diagnostic xrays today in the office. Triage will not re-order.

## 2018-02-03 NOTE — ED Notes (Signed)
ED Provider at bedside. 

## 2018-02-03 NOTE — ED Provider Notes (Signed)
[facility] EMERGENCY DEPARTMENT Provider Note   CSN: [id number] Arrival date & time: 02/03/18  1016     History   Chief Complaint Chief Complaint  Patient presents with  . Back Pain  . Leg Pain    HPI Wilsie Kern Quintero is a 42 y.o. female.  HPI  Patient is a 42 year old female with history of COPD, T2DM, morbid obesity, who presents emergency department today complaining of lower back pain that she states she has had for the last year since having a C-section.  States that she had acute exacerbation of her pain starting 4 days ago.  Pain is gradually worsened over the last several days.  Rates pain 8/10.  Pain feels like sharp stabbing pain.  Radiates down the bilateral lower extremities, more so on the right.  Associated with tingling sensation to the bilateral legs.  Has been ambulatory at home.  No loss of control of bowel function, or saddle anesthesia.  No urinary retention.  No fevers, chills, or history of IV drug use or cancer.  States she follows with neurosurgery and was seen at Dr. Laurena Bering office earlier today.  She was told that she cannot have surgery until she loses weight.  She was told that she may benefit from spinal injections and was referred to another neurosurgeon for this.  She is currently taking Flexeril, gabapentin and Aleve for her symptoms and they have not resolved.  She denies that she was told to come to the ED by her neurosurgeon.  She states she had an MRI about 1 week ago. Reviewed imaging.   Past Medical History:  Diagnosis Date  . Amenorrhea   . Anxiety   . Anxiety   . Asthma   . COPD (chronic obstructive pulmonary disease) ([place])   . Delivery with history of C-section   . Depression   . Depression   . Diabetes mellitus without complication ([place])   . Dysmenorrhea   . Dysrhythmia    DR Johnsie Cancel    . Ectopic pregnancy 2013  . Eosinophilic esophagitis    Diagnosed at [facility] 06/16/2013, untreated  . GERD (gastroesophageal  reflux disease)    HEARTBURN   TUMS  . Hard to intubate 11/07/2015  . Leukocytosis 07/28/2008   Qualifier: Diagnosis of  By: Jonna Munro MD, Roderic Scarce    . Morbid obesity ([place])   . Neuromuscular disorder (HCC)    RESTLESS LEG   . Obesity   . Schizoaffective disorder, bipolar type ([place])   . Sepsis ([place]) 11/11/2014  . Shortness of breath    WITH EXERTION   . Sleep apnea    CPAP    Patient Active Problem List   Diagnosis Date Noted  . Nexplanon insertion 03/27/2017  . Postpartum hypertension 03/05/2017  . Status post primary low transverse cesarean section 02/03/2017  . H/O pre-eclampsia 01/27/2017  . Gestational htn w/o significant proteinuria, third trimester 01/20/2017  . Mild persistent asthma without complication 63/84/6659  . BMI 70 and over, adult ([place]) 12/15/2016  . Perennial allergic rhinitis 11/05/2016  . Mild persistent asthma with acute exacerbation 11/05/2016  . Excess weight gain in pregnancy, second trimester 10/30/2016  . Anxiety and depression 11/07/2015  . Hard to intubate 11/07/2015  . Hypoglycemia 11/07/2015  . OSA (obstructive sleep apnea) 11/07/2015  . DM type 2 (diabetes mellitus, type 2) ([place]) 12/07/2014  . Panniculitis 12/06/2014  . Cellulitis, abdominal wall 11/11/2014  . Abdominal pain 07/14/2014  . Loose stools 07/14/2014  . Melena 01/27/2014  .  Eosinophilic esophagitis 84/16/6063  . Bowel habit changes 04/28/2013  . Esophageal dysphagia 04/28/2013  . Insomnia due to mental disorder(327.02) 08/08/2011  . RLS (restless legs syndrome) 08/08/2011  . Adjustment disorder with depressed mood 08/06/2011  . Schizoaffective disorder, bipolar type ([place]) 08/02/2011    Class: Acute  . PALPITATIONS, OCCASIONAL 11/01/2009  . Bipolar disorder ([place]) 10/19/2008  . DISORDER, TOBACCO USE 08/25/2008  . Leukocytosis 07/28/2008  . ALLERGIC RHINITIS, SEASONAL 08/20/2007  . DYSMETABOLIC SYNDROME 01/60/1093  . Morbid obesity ([place]) 05/13/2006  . EXTERNAL HEMORRHOIDS  05/13/2006  . HYPERLIPIDEMIA 05/12/2006  . Essential hypertension 05/12/2006  . ASTHMA 05/12/2006  . GERD (gastroesophageal reflux disease) 05/12/2006  . OSTEOARTHRITIS 05/12/2006    Past Surgical History:  Procedure Laterality Date  . CESAREAN SECTION MULTI-GESTATIONAL N/A 02/03/2017   Procedure: CESAREAN SECTION MULTI-GESTATIONAL;  Surgeon: Jonnie Kind, MD;  Location: [facility];  Service: Obstetrics;  Laterality: N/A;  . CHOLECYSTECTOMY    . DENTAL SURGERY    . ESOPHAGOGASTRODUODENOSCOPY  May 2007   Dr. Gala Romney: Normal esophagus, stomach, D1, D2  . ESOPHAGOGASTRODUODENOSCOPY  06/16/2013   Dr. Carlton Adam, eosinophilic esophagitis, reactive gastropathy, no esophageal dilation  . TONSILLECTOMY    . TOOTH EXTRACTION  10/28/2011   Procedure: DENTAL RESTORATION/EXTRACTIONS;  Surgeon: Gae Bon, DDS;  Location[facility] OR;  Service: Oral Surgery;;     OB History    Gravida  2   Para  1   Term  1   Preterm      AB  1   Living  2     SAB  0   TAB      Ectopic  1   Multiple  1   Live Births  2            Home Medications    Prior to Admission medications   Medication Sig Start Date End Date Taking? Authorizing Provider  acetaminophen (TYLENOL) 500 MG tablet Take by mouth.    [provider]  albuterol (PROVENTIL HFA;VENTOLIN HFA) 108 (90 Base) MCG/ACT inhaler Inhale 2 puffs into the lungs every 4 (four) hours as needed for wheezing or shortness of breath. 05/27/17   Valentina Shaggy, MD  buPROPion (WELLBUTRIN XL) 300 MG 24 hr tablet Take 300 mg by mouth every morning.  04/28/14   [provider]  busPIRone (BUSPAR) 10 MG tablet Take 15 mg by mouth 2 (two) times daily. For anxiety    [provider]  cefpodoxime (VANTIN) 100 MG tablet Take 1 tablet (100 mg total) by mouth 2 (two) times daily. 07/13/17   Hayden Rasmussen, MD  cetirizine (ZYRTEC) 10 MG tablet Take 1 tablet (10 mg total) by mouth 2 (two) times daily. 05/27/17    Valentina Shaggy, MD  Cholecalciferol (VITAMIN D3) 5000 units CAPS Take 10,000 Units by mouth daily.     [provider]  diphenhydrAMINE (BENADRYL) 25 MG tablet Take 25 mg by mouth daily as needed for itching.     [provider]  fluticasone (FLOVENT HFA) 110 MCG/ACT inhaler Inhale 2 puffs into the lungs 2 (two) times daily. 05/27/17   Valentina Shaggy, MD  lidocaine (LIDODERM) 5 % Place 1 patch onto the skin daily. Remove & Discard patch within 12 hours or as directed by MD 02/03/18   [place] S, PA-C  montelukast (SINGULAIR) 10 MG tablet Take 1 tablet (10 mg total) by mouth at bedtime. 05/27/17   Valentina Shaggy, MD  oxyCODONE-acetaminophen (PERCOCET/ROXICET) [phone]  MG tablet Take 1 tablet by mouth every 6 (six) hours as needed for severe pain. 02/03/18   Arliene Rosenow S, PA-C  predniSONE (STERAPRED UNI-PAK 21 TAB) 10 MG (21) TBPK tablet FPD 05/22/17   [provider]  Prenatal Vit-Fe Fumarate-FA (PRENATAL MULTIVITAMIN) TABS Take 1 tablet by mouth daily.     [provider]  rOPINIRole (REQUIP) 3 MG tablet TK 1 T PO QHS 05/01/17   [provider]  tiZANidine (ZANAFLEX) 4 MG tablet TK 1 T PO Q 8 H PRF PAIN OR SPASM 05/22/17   [provider]  vitamin E 400 UNIT capsule Take 400 Units by mouth daily.    [provider]    Family History Family History  Problem Relation Age of Onset  . Depression Mother   . Anxiety disorder Mother   . Hypertension Sister   . Allergic rhinitis Sister   . Colon polyps Maternal Grandmother        59s  . Diabetes Maternal Grandmother   . Anxiety disorder Maternal Grandmother   . COPD Maternal Grandmother   . Crohn's disease Maternal Aunt   . Cancer Maternal Grandfather        prostate  . HIV/AIDS Father   . Liver disease Neg Hx   . Angioedema Neg Hx   . Eczema Neg Hx   . Immunodeficiency Neg Hx   . Asthma Neg Hx   . Urticaria Neg Hx     Social History Social History     Tobacco Use  . Smoking status: Former Smoker    Packs/day: 0.50    Years: 8.00    Pack years: 4.00    Types: Cigarettes    Last attempt to quit: 04/25/2011    Years since quitting: 6.7  . Smokeless tobacco: Never Used  Substance Use Topics  . Alcohol use: No  . Drug use: No     Allergies   Bee venom; Penicillins; Adhesive [tape]; Latex; and Vancomycin   Review of Systems Review of Systems  Constitutional: Negative for fever.  HENT: Negative for ear pain and sore throat.   Eyes: Negative for visual disturbance.  Respiratory: Negative for shortness of breath.   Cardiovascular: Negative for chest pain.  Gastrointestinal: Positive for constipation. Negative for abdominal pain, diarrhea, nausea and vomiting.  Genitourinary: Negative for dysuria and hematuria.       No loss of control of bowel or bladder function, no urinary retention  Musculoskeletal: Positive for back pain.  Skin: Negative for color change and rash.  Neurological: Negative for weakness and numbness.       Tingling to BLE, no saddle anesthesia  All other systems reviewed and are negative.    Physical Exam Updated Vital Signs BP 128/69   Pulse 88   Temp 97.7 F (36.5 C) (Oral)   Resp 18   Ht 5\' 3"  (1.6 m)   Wt (!) 203.7 kg   SpO2 97%   BMI 79.54 kg/m   Physical Exam  Constitutional: She appears well-developed and well-nourished. No distress.  HENT:  Head: Normocephalic and atraumatic.  Eyes: Conjunctivae are normal.  Neck: Neck supple.  Cardiovascular: Normal rate, regular rhythm and normal heart sounds.  No murmur heard. Pulmonary/Chest: Effort normal and breath sounds normal. No respiratory distress. She has no wheezes.  Abdominal: Soft. There is no tenderness.  Morbidly obese  Musculoskeletal:  Midline lumbar tenderness.  Tenderness to the right buttock area without any obvious changes to the skin.  Exam limited due  to body habitus.  5/5 strength to the bilateral lower extremities.   Normal sensation throughout.  Ambulatory down the hall with steady gait.  Neurological: She is alert.  Skin: Skin is warm and dry. Capillary refill takes less than 2 seconds.  Psychiatric: She has a normal mood and affect.  Nursing note and vitals reviewed.  ED Treatments / Results  Labs (all labs ordered are listed, but only abnormal results are displayed) Labs Reviewed - No data to display  EKG None  Radiology No results found.  Procedures Procedures (including critical care time)  Medications Ordered in ED Medications  oxyCODONE-acetaminophen (PERCOCET/ROXICET) 5-325 MG per tablet 1 tablet (1 tablet Oral Given 02/03/18 1217)     Initial Impression / Assessment and Plan / ED Course  I have reviewed the triage vital signs and the nursing notes.  Pertinent labs & imaging results that were available during my care of the patient were reviewed by me and considered in my medical decision making (see chart for details).     Final Clinical Impressions(s) / ED Diagnoses   Final diagnoses:  Sciatica, unspecified laterality   Normal neurological exam, no evidence of urinary incontinence or retention, pain is consistently reproducible. There is no evidence of AAA or concern for dissection at this time.   Patient can walk but states is painful.  No loss of bowel or bladder control.  No concern for cauda equina.  No fever, night sweats, weight loss, h/o cancer, IVDU.  Pain treated here in the department with adequate improvement. RICE protocol and pain medicine indicated and discussed with patient. I have also discussed reasons to return immediately to the ER.  Currently follows with neurosurgery.  She had an MRI 1 week ago and was seen by neurosurgery today and discharged.  Have referred her back to neurosurgery for continued treatment of her symptoms. Return precautions discussed.  Patient expresses understanding and agrees with plan.    ED Discharge Orders         Ordered     oxyCODONE-acetaminophen (PERCOCET/ROXICET) 5-325 MG tablet  Every 6 hours PRN     02/03/18 1733    lidocaine (LIDODERM) 5 %  Every 24 hours     02/03/18 1733           Rodney Booze, PA-C 02/03/18 1739    Tegeler, Gwenyth Allegra, MD 02/03/18 2259

## 2018-02-09 ENCOUNTER — Telehealth (INDEPENDENT_AMBULATORY_CARE_PROVIDER_SITE_OTHER): Payer: Self-pay | Admitting: *Deleted

## 2018-02-09 NOTE — Telephone Encounter (Signed)
Auth No / Request ID [id number] Status Auto-Approved Decision Approved Effective Date 02/11/2018 Expiration Date 03/28/2018  Called pt on mobile and home and lvm #1.

## 2018-02-12 DIAGNOSIS — M545 Low back pain: Secondary | ICD-10-CM | POA: Diagnosis not present

## 2018-02-17 ENCOUNTER — Encounter (INDEPENDENT_AMBULATORY_CARE_PROVIDER_SITE_OTHER): Payer: Medicare HMO

## 2018-02-18 ENCOUNTER — Other Ambulatory Visit (HOSPITAL_COMMUNITY): Payer: Self-pay | Admitting: *Deleted

## 2018-02-19 ENCOUNTER — Other Ambulatory Visit (HOSPITAL_COMMUNITY): Payer: Self-pay

## 2018-02-21 DIAGNOSIS — E669 Obesity, unspecified: Secondary | ICD-10-CM | POA: Diagnosis not present

## 2018-02-21 DIAGNOSIS — Z6841 Body Mass Index (BMI) 40.0 and over, adult: Secondary | ICD-10-CM | POA: Diagnosis not present

## 2018-02-23 ENCOUNTER — Ambulatory Visit (INDEPENDENT_AMBULATORY_CARE_PROVIDER_SITE_OTHER): Payer: Self-pay

## 2018-02-23 ENCOUNTER — Ambulatory Visit (INDEPENDENT_AMBULATORY_CARE_PROVIDER_SITE_OTHER): Payer: Medicare HMO | Admitting: Physical Medicine and Rehabilitation

## 2018-02-23 ENCOUNTER — Encounter (INDEPENDENT_AMBULATORY_CARE_PROVIDER_SITE_OTHER): Payer: Self-pay | Admitting: Physical Medicine and Rehabilitation

## 2018-02-23 VITALS — BP 155/89 | HR 106 | Temp 98.0°F

## 2018-02-23 DIAGNOSIS — M4316 Spondylolisthesis, lumbar region: Secondary | ICD-10-CM

## 2018-02-23 DIAGNOSIS — F331 Major depressive disorder, recurrent, moderate: Secondary | ICD-10-CM | POA: Diagnosis not present

## 2018-02-23 DIAGNOSIS — M5416 Radiculopathy, lumbar region: Secondary | ICD-10-CM

## 2018-02-23 DIAGNOSIS — F419 Anxiety disorder, unspecified: Secondary | ICD-10-CM | POA: Diagnosis not present

## 2018-02-23 MED ORDER — METHYLPREDNISOLONE ACETATE 80 MG/ML IJ SUSP: 80.0000 mg | Freq: Once | INTRAMUSCULAR | Status: DC

## 2018-02-23 NOTE — Progress Notes (Signed)
Heather [name] - 42 y.o. female MRN [id number]  Date of birth: 1975/11/23  Office Visit Note: Visit Date: 02/23/2018 PCP: Lucianne Lei, MD Referred by: Lucianne Lei, MD  Subjective: Chief Complaint  Patient presents with  . Lower Back - Pain  . Right Leg - Pain   HPI: Heather [name] is a 42 y.o. female who comes in today At the request of Dr. Phylliss Bob from [facility] orthopedic and sports [place] for epidural injection.  Patient has grade 1 listhesis of L5 on S1 with severe osteoarthritis and pretty severe bilateral foraminal narrowing.  Small disc extrusion at L2-3.  Initially seen by Dr. Lynann Bologna with good work-up with 6 weeks of physical therapy and medication type management.  She does continue to work.  She reports lower back pain that refers on the right more than left leg.  She reports this started about a year ago and is worse with bending.  Getting up and down seems to make the pain worse.  She rates her pain as a 7 out of 10.  When she saw Dr. Lynann Bologna it was more left leg pain.  She has had no specific trauma.  She reports prior history of epidural injection.  Her situation is complicated by morbid obesity with BMI of 79.5 and she weighs 449 pounds.  She also has bipolar disorder and depression anxiety and diabetes.  She was kindly seen here for evaluation and potential injection Sharol Given her body habitus and Dr. Mina Marble reported that his procedure table in the surgery center procedure table would not support her weight.  Review of Systems  Constitutional: Negative for chills, fever, malaise/fatigue and weight loss.  HENT: Negative for hearing loss and sinus pain.   Eyes: Negative for blurred vision, double vision and photophobia.  Respiratory: Negative for cough and shortness of breath.   Cardiovascular: Negative for chest pain, palpitations and leg swelling.  Gastrointestinal: Negative for abdominal pain, nausea and vomiting.  Genitourinary: Negative for flank pain.    Musculoskeletal: Positive for back pain and joint pain. Negative for myalgias.  Skin: Negative for itching and rash.  Neurological: Negative for tremors, focal weakness and weakness.  Endo/Heme/Allergies: Negative.   Psychiatric/Behavioral: Negative for depression.  All other systems reviewed and are negative.  Otherwise per HPI.  Assessment & Plan: Visit Diagnoses:  1. Lumbar radiculopathy   2. Spondylolisthesis of lumbar region   3. Morbid obesity ([place])     Plan: Findings:  Chronic worsening severe pain at times more right radicular pain in L5-S1 distribution with some pain that will flip-flop left and right with history of severe facet arthrosis at L5-S1 with listhesis and by foraminal narrowing.  She is morbidly obese weighing 449 pounds.  Did suggest today that epidural injection would likely give her some benefit.  The table in our office is rated at 600 pounds.  She unfortunately was unable to get up on the table from general fear and panic of being on top of the table and fear that she was going to fall.  She was not able to complete injection.  I did fill out a work form for her to return to work and that was simply based on the fact that she is continuing to work at this point we did not do an interventional spine procedure.  I am not sure what is left for her at this point.  I do not think I have much to offer as well.  She can return to see  Dr. Mina Marble at [facility] orthopedic.  They may see if [facility] imaging has some way to transfer from a stretcher or gurney onto a larger table.  Her best outcome would be to somehow get into a bariatric program.  Weight loss and activity and core strengthening should be her mainstay.    Meds & Orders:  Meds ordered this encounter  Medications  . DISCONTD: methylPREDNISolone acetate (DEPO-MEDROL) injection 80 mg   No orders of the defined types were placed in this encounter.   Follow-up: Return for [facility]..   Procedures: No  procedures performed  No notes on file   Clinical History: MRI LUMBAR SPINE WITHOUT CONTRAST  TECHNIQUE: Multiplanar, multisequence MR imaging of the lumbar spine was performed. No intravenous contrast was administered.  COMPARISON:  None.  FINDINGS: The examination is degraded by artifacts caused by patient motion and body habitus.  Segmentation: Normal  Alignment:  Grade 1 anterolisthesis at L5-S1.  Vertebrae:  No fracture, evidence of discitis, or bone lesion.  Conus medullaris and cauda equina: Conus extends to the L1 level. Conus and cauda equina appear normal.  Paraspinal and other soft tissues: Poor visualization due to artifacts from patient body habitus.  Disc levels:  T10-T11 and T11-T12 are imaged only in the sagittal plane, but are normal.  T12-L1: Small right subarticular disc protrusion. No spinal canal or neural foraminal stenosis.  L1-L2: Small right subarticular disc protrusion. No spinal canal or foraminal stenosis.  L2-L3: Small central disc extrusion with inferior migration. Endplate ridging. Mild spinal canal stenosis. Mild-to-moderate bilateral foraminal stenosis.  L3-L4: Normal.  L4-L5: Small disc bulge with mild foraminal narrowing but no spinal canal stenosis.  L5-S1: Severe facet hypertrophy with grade 1 anterolisthesis. No central spinal canal stenosis. Moderate-to-severe bilateral neural foraminal stenosis.  IMPRESSION: 1. L5-S1 severe facet arthrosis causing grade 1 anterolisthesis with moderate-to-severe bilateral neural foraminal stenosis. 2. L2-L3 mild spinal canal stenosis and mild-to-moderate bilateral foraminal stenosis secondary to central disc extrusion with inferior migration. 3. Small right subarticular disc protrusions at T12-L1 and L1-L2 without central spinal canal stenosis or neural impingement.   Electronically Signed   By: Ulyses Jarred M.D.   On: 01/25/2018 03:06   She reports that she  quit smoking about 6 years ago. Her smoking use included cigarettes. She has a 4.00 pack-year smoking history. She has never used smokeless tobacco. No results for input(s): HGBA1C, LABURIC in the last 8760 hours.  Objective:  VS:  HT:    WT:   BMI:     BP:(!) 155/89  HR:(!) 106bpm  TEMP:98 F (36.7 C)(Oral)  RESP:  Physical Exam  Constitutional: She is oriented to person, place, and time. She appears well-developed and well-nourished. No distress.  Morbidly obese  HENT:  Head: Normocephalic and atraumatic.  Nose: Nose normal.  Mouth/Throat: Oropharynx is clear and moist.  Eyes: Pupils are equal, round, and reactive to light. Conjunctivae are normal.  Neck: Normal range of motion. Neck supple.  Cardiovascular: Regular rhythm and intact distal pulses.  Pulmonary/Chest: Effort normal. No respiratory distress.  Abdominal: She exhibits no distension. There is no guarding.  Musculoskeletal:  She ambulates without aid with a wide-based gait.  She is very slow to rise from seated position.  She does have pain with extension of the lumbar spine.  She has good distal strength without clonus.  Neurological: She is alert and oriented to person, place, and time. She exhibits normal muscle tone. Coordination normal.  Skin: Skin is warm. No rash noted. No  erythema.  Psychiatric: She has a normal mood and affect. Her behavior is normal.  Nursing note and vitals reviewed.   Ortho Exam Imaging: No results found.  Past Medical/Family/Surgical/Social History: Medications & Allergies reviewed per EMR, new medications updated. Patient Active Problem List   Diagnosis Date Noted  . Nexplanon insertion 03/27/2017  . Postpartum hypertension 03/05/2017  . Status post primary low transverse cesarean section 02/03/2017  . H/O pre-eclampsia 01/27/2017  . Gestational htn w/o significant proteinuria, third trimester 01/20/2017  . Mild persistent asthma without complication 25/42/7062  . BMI 70 and over,  adult ([place]) 12/15/2016  . Perennial allergic rhinitis 11/05/2016  . Mild persistent asthma with acute exacerbation 11/05/2016  . Excess weight gain in pregnancy, second trimester 10/30/2016  . Anxiety and depression 11/07/2015  . Hard to intubate 11/07/2015  . Hypoglycemia 11/07/2015  . OSA (obstructive sleep apnea) 11/07/2015  . DM type 2 (diabetes mellitus, type 2) ([place]) 12/07/2014  . Panniculitis 12/06/2014  . Cellulitis, abdominal wall 11/11/2014  . Abdominal pain 07/14/2014  . Loose stools 07/14/2014  . Melena 01/27/2014  . Eosinophilic esophagitis 37/62/8315  . Bowel habit changes 04/28/2013  . Esophageal dysphagia 04/28/2013  . Insomnia due to mental disorder(327.02) 08/08/2011  . RLS (restless legs syndrome) 08/08/2011  . Adjustment disorder with depressed mood 08/06/2011  . Schizoaffective disorder, bipolar type ([place]) 08/02/2011    Class: Acute  . PALPITATIONS, OCCASIONAL 11/01/2009  . Bipolar disorder ([place]) 10/19/2008  . DISORDER, TOBACCO USE 08/25/2008  . Leukocytosis 07/28/2008  . ALLERGIC RHINITIS, SEASONAL 08/20/2007  . DYSMETABOLIC SYNDROME 17/61/6073  . Morbid obesity ([place]) 05/13/2006  . EXTERNAL HEMORRHOIDS 05/13/2006  . HYPERLIPIDEMIA 05/12/2006  . Essential hypertension 05/12/2006  . ASTHMA 05/12/2006  . GERD (gastroesophageal reflux disease) 05/12/2006  . OSTEOARTHRITIS 05/12/2006   Past Medical History:  Diagnosis Date  . Amenorrhea   . Anxiety   . Anxiety   . Asthma   . COPD (chronic obstructive pulmonary disease) ([place])   . Delivery with history of C-section   . Depression   . Depression   . Diabetes mellitus without complication ([place])   . Dysmenorrhea   . Dysrhythmia    DR Johnsie Cancel    . Ectopic pregnancy 2013  . Eosinophilic esophagitis    Diagnosed at [facility] 06/16/2013, untreated  . GERD (gastroesophageal reflux disease)    HEARTBURN   TUMS  . Hard to intubate 11/07/2015  . Leukocytosis 07/28/2008   Qualifier: Diagnosis of  By: Jonna Munro MD,  Roderic Scarce    . Morbid obesity ([place])   . Neuromuscular disorder (HCC)    RESTLESS LEG   . Obesity   . Schizoaffective disorder, bipolar type ([place])   . Sepsis ([place]) 11/11/2014  . Shortness of breath    WITH EXERTION   . Sleep apnea    CPAP   Family History  Problem Relation Age of Onset  . Depression Mother   . Anxiety disorder Mother   . Hypertension Sister   . Allergic rhinitis Sister   . Colon polyps Maternal Grandmother        30s  . Diabetes Maternal Grandmother   . Anxiety disorder Maternal Grandmother   . COPD Maternal Grandmother   . Crohn's disease Maternal Aunt   . Cancer Maternal Grandfather        prostate  . HIV/AIDS Father   . Liver disease Neg Hx   . Angioedema Neg Hx   . Eczema Neg Hx   . Immunodeficiency Neg Hx   . Asthma  Neg Hx   . Urticaria Neg Hx    Past Surgical History:  Procedure Laterality Date  . CESAREAN SECTION MULTI-GESTATIONAL N/A 02/03/2017   Procedure: CESAREAN SECTION MULTI-GESTATIONAL;  Surgeon: Jonnie Kind, MD;  Location: [facility];  Service: Obstetrics;  Laterality: N/A;  . CHOLECYSTECTOMY    . DENTAL SURGERY    . ESOPHAGOGASTRODUODENOSCOPY  May 2007   Dr. Gala Romney: Normal esophagus, stomach, D1, D2  . ESOPHAGOGASTRODUODENOSCOPY  06/16/2013   Dr. Carlton Adam, eosinophilic esophagitis, reactive gastropathy, no esophageal dilation  . TONSILLECTOMY    . TOOTH EXTRACTION  10/28/2011   Procedure: DENTAL RESTORATION/EXTRACTIONS;  Surgeon: Gae Bon, DDS;  Location: [facility] OR;  Service: Oral Surgery;;   Social History   Occupational History  . Occupation: Psychologist, occupational  Tobacco Use  . Smoking status: Former Smoker    Packs/day: 0.50    Years: 8.00    Pack years: 4.00    Types: Cigarettes    Last attempt to quit: 04/25/2011    Years since quitting: 6.8  . Smokeless tobacco: Never Used  Substance and Sexual Activity  . Alcohol use: No  . Drug use: No  . Sexual activity: Not Currently    Birth control/protection: None    Comment:  stopped smoking in jan.. had a pack the other day

## 2018-02-23 NOTE — Progress Notes (Signed)
 .  Numeric Pain Rating Scale and Functional Assessment Average Pain 7   In the last MONTH (on 0-10 scale) has pain interfered with the following?  1. General activity like being  able to carry out your everyday physical activities such as walking, climbing stairs, carrying groceries, or moving a chair?  Rating(6)   +Driver, -BT, -Dye Allergies.  

## 2018-02-26 ENCOUNTER — Ambulatory Visit (HOSPITAL_COMMUNITY): Payer: Self-pay | Admitting: Hematology

## 2018-02-26 ENCOUNTER — Other Ambulatory Visit (HOSPITAL_COMMUNITY): Payer: Self-pay

## 2018-02-26 ENCOUNTER — Ambulatory Visit (HOSPITAL_COMMUNITY): Payer: Self-pay | Admitting: Adult Health

## 2018-03-02 DIAGNOSIS — F419 Anxiety disorder, unspecified: Secondary | ICD-10-CM | POA: Diagnosis not present

## 2018-03-02 DIAGNOSIS — F331 Major depressive disorder, recurrent, moderate: Secondary | ICD-10-CM | POA: Diagnosis not present

## 2018-03-11 ENCOUNTER — Inpatient Hospital Stay (HOSPITAL_COMMUNITY): Payer: Medicare HMO

## 2018-03-11 ENCOUNTER — Inpatient Hospital Stay (HOSPITAL_COMMUNITY): Payer: Medicare HMO | Attending: Internal Medicine | Admitting: Internal Medicine

## 2018-03-11 DIAGNOSIS — D72829 Elevated white blood cell count, unspecified: Secondary | ICD-10-CM

## 2018-04-05 ENCOUNTER — Other Ambulatory Visit: Payer: Self-pay | Admitting: Allergy & Immunology

## 2018-04-24 DIAGNOSIS — F331 Major depressive disorder, recurrent, moderate: Secondary | ICD-10-CM | POA: Diagnosis not present

## 2018-04-24 DIAGNOSIS — F419 Anxiety disorder, unspecified: Secondary | ICD-10-CM | POA: Diagnosis not present

## 2018-05-02 DIAGNOSIS — F3341 Major depressive disorder, recurrent, in partial remission: Secondary | ICD-10-CM | POA: Diagnosis not present

## 2018-05-02 DIAGNOSIS — E668 Other obesity: Secondary | ICD-10-CM | POA: Diagnosis not present

## 2018-05-11 DIAGNOSIS — F331 Major depressive disorder, recurrent, moderate: Secondary | ICD-10-CM | POA: Diagnosis not present

## 2018-05-11 DIAGNOSIS — F419 Anxiety disorder, unspecified: Secondary | ICD-10-CM | POA: Diagnosis not present

## 2018-05-19 ENCOUNTER — Encounter (INDEPENDENT_AMBULATORY_CARE_PROVIDER_SITE_OTHER): Payer: Medicare HMO

## 2018-05-25 DIAGNOSIS — F419 Anxiety disorder, unspecified: Secondary | ICD-10-CM | POA: Diagnosis not present

## 2018-05-25 DIAGNOSIS — F331 Major depressive disorder, recurrent, moderate: Secondary | ICD-10-CM | POA: Diagnosis not present

## 2018-05-29 NOTE — Progress Notes (Signed)
REVIEWED-NO ADDITIONAL RECOMMENDATIONS. 

## 2018-06-03 DIAGNOSIS — J9801 Acute bronchospasm: Secondary | ICD-10-CM | POA: Diagnosis not present

## 2018-06-03 DIAGNOSIS — R197 Diarrhea, unspecified: Secondary | ICD-10-CM | POA: Diagnosis not present

## 2018-06-03 DIAGNOSIS — J1189 Influenza due to unidentified influenza virus with other manifestations: Secondary | ICD-10-CM | POA: Diagnosis not present

## 2018-06-04 ENCOUNTER — Ambulatory Visit (INDEPENDENT_AMBULATORY_CARE_PROVIDER_SITE_OTHER): Payer: Medicare HMO | Admitting: Bariatrics

## 2018-06-11 ENCOUNTER — Ambulatory Visit (INDEPENDENT_AMBULATORY_CARE_PROVIDER_SITE_OTHER): Payer: Medicare HMO | Admitting: Bariatrics

## 2018-06-11 ENCOUNTER — Other Ambulatory Visit: Payer: Self-pay

## 2018-06-11 ENCOUNTER — Encounter (INDEPENDENT_AMBULATORY_CARE_PROVIDER_SITE_OTHER): Payer: Self-pay | Admitting: Bariatrics

## 2018-06-11 ENCOUNTER — Emergency Department (HOSPITAL_COMMUNITY)
Admission: EM | Admit: 2018-06-11 | Discharge: 2018-06-11 | Disposition: A | Discharge status: Home / Self Care | Payer: Medicare HMO | Attending: Emergency Medicine | Admitting: Emergency Medicine

## 2018-06-11 VITALS — BP 140/68 | HR 76 | Temp 97.5°F | Ht 63.0 in | Wt >= 6400 oz

## 2018-06-11 DIAGNOSIS — R195 Other fecal abnormalities: Secondary | ICD-10-CM

## 2018-06-11 DIAGNOSIS — Z0289 Encounter for other administrative examinations: Secondary | ICD-10-CM

## 2018-06-11 DIAGNOSIS — G4733 Obstructive sleep apnea (adult) (pediatric): Secondary | ICD-10-CM | POA: Diagnosis not present

## 2018-06-11 DIAGNOSIS — Z6841 Body Mass Index (BMI) 40.0 and over, adult: Secondary | ICD-10-CM | POA: Diagnosis not present

## 2018-06-11 DIAGNOSIS — J45909 Unspecified asthma, uncomplicated: Secondary | ICD-10-CM | POA: Insufficient documentation

## 2018-06-11 DIAGNOSIS — M5442 Lumbago with sciatica, left side: Secondary | ICD-10-CM | POA: Diagnosis not present

## 2018-06-11 DIAGNOSIS — R0602 Shortness of breath: Secondary | ICD-10-CM

## 2018-06-11 DIAGNOSIS — E559 Vitamin D deficiency, unspecified: Secondary | ICD-10-CM | POA: Diagnosis not present

## 2018-06-11 DIAGNOSIS — I10 Essential (primary) hypertension: Secondary | ICD-10-CM | POA: Diagnosis not present

## 2018-06-11 DIAGNOSIS — Z79899 Other long term (current) drug therapy: Secondary | ICD-10-CM | POA: Diagnosis not present

## 2018-06-11 DIAGNOSIS — E119 Type 2 diabetes mellitus without complications: Secondary | ICD-10-CM | POA: Diagnosis not present

## 2018-06-11 DIAGNOSIS — R5383 Other fatigue: Secondary | ICD-10-CM

## 2018-06-11 DIAGNOSIS — G8929 Other chronic pain: Secondary | ICD-10-CM | POA: Diagnosis not present

## 2018-06-11 DIAGNOSIS — E162 Hypoglycemia, unspecified: Secondary | ICD-10-CM | POA: Diagnosis not present

## 2018-06-11 DIAGNOSIS — Z87891 Personal history of nicotine dependence: Secondary | ICD-10-CM | POA: Diagnosis not present

## 2018-06-11 DIAGNOSIS — M5440 Lumbago with sciatica, unspecified side: Secondary | ICD-10-CM | POA: Insufficient documentation

## 2018-06-11 DIAGNOSIS — J4530 Mild persistent asthma, uncomplicated: Secondary | ICD-10-CM | POA: Diagnosis not present

## 2018-06-11 DIAGNOSIS — R5382 Chronic fatigue, unspecified: Secondary | ICD-10-CM | POA: Diagnosis not present

## 2018-06-11 DIAGNOSIS — E538 Deficiency of other specified B group vitamins: Secondary | ICD-10-CM | POA: Diagnosis not present

## 2018-06-11 DIAGNOSIS — M5441 Lumbago with sciatica, right side: Secondary | ICD-10-CM | POA: Diagnosis not present

## 2018-06-11 DIAGNOSIS — F3289 Other specified depressive episodes: Secondary | ICD-10-CM | POA: Diagnosis not present

## 2018-06-11 DIAGNOSIS — D510 Vitamin B12 deficiency anemia due to intrinsic factor deficiency: Secondary | ICD-10-CM | POA: Diagnosis not present

## 2018-06-11 DIAGNOSIS — M549 Dorsalgia, unspecified: Secondary | ICD-10-CM | POA: Diagnosis present

## 2018-06-11 LAB — CBG MONITORING, ED: Glucose-Capillary: 85 mg/dL (ref 70–99)

## 2018-06-11 MED ORDER — CYCLOBENZAPRINE HCL 10 MG PO TABS
10.0000 mg | ORAL_TABLET | Freq: Once | ORAL | Status: AC
  Administered 2018-06-11: 10 mg via ORAL
  Filled 2018-06-11: qty 1

## 2018-06-11 MED ORDER — PREDNISONE 10 MG PO TABS: 40.0000 mg | ORAL_TABLET | Freq: Every day | ORAL | 0 refills | Status: AC

## 2018-06-11 MED ORDER — DEXAMETHASONE SODIUM PHOSPHATE 10 MG/ML IJ SOLN
10.0000 mg | Freq: Once | INTRAMUSCULAR | Status: AC
  Administered 2018-06-11: 10 mg via INTRAVENOUS
  Filled 2018-06-11: qty 1

## 2018-06-11 MED ORDER — KETOROLAC TROMETHAMINE 60 MG/2ML IM SOLN
60.0000 mg | Freq: Once | INTRAMUSCULAR | Status: AC
  Administered 2018-06-11: 60 mg via INTRAMUSCULAR
  Filled 2018-06-11: qty 2

## 2018-06-11 MED ORDER — LIDOCAINE 5 % EX PTCH: 1.0000 | MEDICATED_PATCH | CUTANEOUS | 0 refills | Status: DC

## 2018-06-11 MED ORDER — CYCLOBENZAPRINE HCL 10 MG PO TABS: 10.0000 mg | ORAL_TABLET | Freq: Two times a day (BID) | ORAL | 0 refills | Status: DC | PRN

## 2018-06-11 NOTE — Discharge Instructions (Signed)
Take Tylenol 1000 mg 4 times a day for 1 week. This is the maximum dose of Tylenol (acetaminophen) you can take from all sources. Please check other over-the-counter medications and prescriptions to ensure you are not taking other medications that contain acetaminophen.  Take this in addition to the aleve you have been taking and you may take it with the flexeril and lidocaine patch prescribed.

## 2018-06-11 NOTE — ED Triage Notes (Signed)
Per pt Pt reports numbness in her legs, discoloration of large toe, and loss a couple episodes of bladder/bowel control. HTX of sciatica.

## 2018-06-11 NOTE — ED Provider Notes (Signed)
[facility] DEPT Provider Note   CSN: [id number] Arrival date & time: 06/11/18  1704    History   Chief Complaint Chief Complaint  Patient presents with  . Numbness    Numbness in both legs     HPI Heather [name] is a 43 y.o. female.     HPI   43 year old female with history of diabetes, depression, COPD, hyperlipidemia, who presents with concern for back pain.  Patient reports she is had ongoing issues with back pain since she had her C-section for twins 1 year ago and had her spinal.  Reports it has been waxing and waning, however worsening over the last week.  She saw her chiropractor today and described some of her symptoms, was sent to the emergency department for further evaluation.  Reports that 3 weeks ago, she had one episode of bowel incontinence, however is not had any other issues of bowel incontinence since then.  Reports 1 week ago she had an episode of bladder incontinence, however has not had recent issues.  Denies symptoms of urinary retention.  Reports that she has had chronic bilateral lower extremity weakness that she is felt since she had her C-section.  Reports numbness in the bilateral lower extremities from the calf down which has been present over the last week.  Last week, reports he had cough, congestion and fever, reports the symptoms have since resolved.  No falls.  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  Past Medical History:  Diagnosis Date  . Amenorrhea   . Anxiety   . Anxiety   . Arthritis   . Asthma   . Back pain   . Constipation   . COPD (chronic obstructive pulmonary disease) ([place])   . Delivery with history of C-section   . Depression   . Depression   . Diabetes mellitus without complication ([place])   . Dysmenorrhea   . Dysrhythmia    DR Johnsie Cancel     . Ectopic pregnancy 2013  . Edema, lower extremity   . Eosinophilic esophagitis    Diagnosed at [facility] 06/16/2013, untreated  . Gallbladder problem   . GERD (gastroesophageal reflux disease)    HEARTBURN   TUMS  . Hard to intubate 11/07/2015  . High cholesterol   . IBS (irritable bowel syndrome)   . Leukocytosis 07/28/2008   Qualifier: Diagnosis of  By: Jonna Munro MD, Roderic Scarce    . Morbid obesity ([place])   . Neuromuscular disorder (H[facility])    RESTLESS LEG   . Obesity   . Schizoaffective disorder, bipolar type ([place])   . Sepsis ([place]) 11/11/2014  . Shortness of breath    WITH EXERTION   . Sleep apnea    CPAP    Patient Active Problem List   Diagnosis Date Noted  . Nexplanon insertion 03/27/2017  . Postpartum hypertension 03/05/2017  . Status post primary low transverse cesarean section 02/03/2017  . H/O pre-eclampsia 01/27/2017  . Gestational htn w/o significant proteinuria, third trimester 01/20/2017  . Mild persistent asthma without complication 38/46/6599  . BMI 70 and over, adult ([place]) 12/15/2016  . Perennial allergic rhinitis 11/05/2016  . Mild persistent asthma with acute exacerbation 11/05/2016  . Excess weight gain in pregnancy, second trimester 10/30/2016  . Anxiety and depression 11/07/2015  . Hard to intubate 11/07/2015  . Hypoglycemia  11/07/2015  . OSA (obstructive sleep apnea) 11/07/2015  . DM type 2 (diabetes mellitus, type 2) ([place]) 12/07/2014  . Panniculitis 12/06/2014  . Cellulitis, abdominal wall 11/11/2014  . Abdominal pain 07/14/2014  . Loose stools 07/14/2014  . Melena 01/27/2014  . Eosinophilic esophagitis 29/92/4268  . Bowel habit changes 04/28/2013  . Esophageal dysphagia 04/28/2013  . Insomnia due to mental disorder(327.02) 08/08/2011  . RLS (restless legs syndrome) 08/08/2011  . Adjustment disorder with depressed mood 08/06/2011  . Schizoaffective disorder, bipolar type ([place]) 08/02/2011    Class: Acute  . PALPITATIONS, OCCASIONAL 11/01/2009  .  Bipolar disorder ([place]) 10/19/2008  . DISORDER, TOBACCO USE 08/25/2008  . Leukocytosis 07/28/2008  . ALLERGIC RHINITIS, SEASONAL 08/20/2007  . DYSMETABOLIC SYNDROME 34/19/6222  . Morbid obesity ([place]) 05/13/2006  . EXTERNAL HEMORRHOIDS 05/13/2006  . HYPERLIPIDEMIA 05/12/2006  . Essential hypertension 05/12/2006  . ASTHMA 05/12/2006  . GERD (gastroesophageal reflux disease) 05/12/2006  . OSTEOARTHRITIS 05/12/2006    Past Surgical History:  Procedure Laterality Date  . CESAREAN SECTION MULTI-GESTATIONAL N/A 02/03/2017   Procedure: CESAREAN SECTION MULTI-GESTATIONAL;  Surgeon: Jonnie Kind, MD;  Location: [facility];  Service: Obstetrics;  Laterality: N/A;  . CHOLECYSTECTOMY    . DENTAL SURGERY    . ESOPHAGOGASTRODUODENOSCOPY  May 2007   Dr. Gala Romney: Normal esophagus, stomach, D1, D2  . ESOPHAGOGASTRODUODENOSCOPY  06/16/2013   Dr. Carlton Adam, eosinophilic esophagitis, reactive gastropathy, no esophageal dilation  . TONSILLECTOMY    . TOOTH EXTRACTION  10/28/2011   Procedure: DENTAL RESTORATION/EXTRACTIONS;  Surgeon: Gae Bon, DDS;  Location: [facility] OR;  Service: Oral Surgery;;     OB History    Gravida  2   Para  1   Term  1   Preterm      AB  1   Living  2     SAB  0   TAB      Ectopic  1   Multiple  1   Live Births  2            Home Medications    Prior to Admission medications   Medication Sig Start Date End Date Taking? Authorizing Provider  albuterol (PROVENTIL HFA;VENTOLIN HFA) 108 (90 Base) MCG/ACT inhaler Inhale 2 puffs into the lungs every 4 (four) hours as needed for wheezing or shortness of breath. 05/27/17  Yes Valentina Shaggy, MD  buPROPion (WELLBUTRIN XL) 300 MG 24 hr tablet Take 300 mg by mouth every morning.  04/28/14  Yes [provider]  busPIRone (BUSPAR) 15 MG tablet Take 15 mg by mouth 2 (two) times daily. 05/30/18  Yes [provider]  Cholecalciferol (VITAMIN D3) 5000 units CAPS Take 10,000 Units by mouth  daily.    Yes [provider]  citalopram (CELEXA) 40 MG tablet Take 40 mg by mouth at bedtime.    Yes [provider]  fluticasone (FLOVENT HFA) 110 MCG/ACT inhaler Inhale 2 puffs into the lungs 2 (two) times daily. 05/27/17  Yes Valentina Shaggy, MD  indapamide (LOZOL) 2.5 MG tablet Take 2.5 mg by mouth daily.   Yes [provider]  loratadine (CLARITIN) 10 MG tablet Take 10 mg by mouth daily.   Yes [provider]  Methylsulfonylmethane (MSM) 1000 MG CAPS Take by mouth.   Yes [provider]  montelukast (SINGULAIR) 10 MG tablet TAKE 1 TABLET(10 MG) BY MOUTH AT BEDTIME Patient taking differently: Take 10 mg by mouth at bedtime.  04/06/18  Yes Valentina Shaggy, MD  Prenatal Vit-Fe Fumarate-FA (PRENATAL MULTIVITAMIN) TABS Take 1 tablet by mouth daily.    Yes [provider]  rOPINIRole (REQUIP) 3 MG tablet Take 3 mg by mouth at bedtime.  05/01/17  Yes [provider]  Turmeric 500 MG CAPS Take by mouth.   Yes [provider]  vitamin B-12 (CYANOCOBALAMIN) 500 MCG tablet Take 500 mcg by mouth daily.   Yes [provider]  vitamin E 400 UNIT capsule Take 400 Units by mouth daily.   Yes [provider]  cyclobenzaprine (FLEXERIL) 10 MG tablet Take 1 tablet (10 mg total) by mouth 2 (two) times daily as needed for muscle spasms. 06/11/18   Gareth Morgan, MD  lidocaine (LIDODERM) 5 % Place 1 patch onto the skin daily. Remove & Discard patch within 12 hours or as directed by MD 06/11/18   Gareth Morgan, MD  predniSONE (DELTASONE) 10 MG tablet Take 4 tablets (40 mg total) by mouth daily for 4 days. 06/11/18 06/15/18  Gareth Morgan, MD    Family History Family History  Problem Relation Age of Onset  . Depression Mother   . Anxiety disorder Mother   . High blood pressure Mother   . Bipolar disorder Mother   . Eating disorder Mother   . Obesity Mother   . Hypertension Sister   . Allergic rhinitis  Sister   . Colon polyps Maternal Grandmother        81s  . Diabetes Maternal Grandmother   . Anxiety disorder Maternal Grandmother   . COPD Maternal Grandmother   . Crohn's disease Maternal Aunt   . Cancer Maternal Grandfather        prostate  . HIV/AIDS Father   . Eating disorder Father   . Obesity Father   . Liver disease Neg Hx   . Angioedema Neg Hx   . Eczema Neg Hx   . Immunodeficiency Neg Hx   . Asthma Neg Hx   . Urticaria Neg Hx     Social History Social History   Tobacco Use  . Smoking status: Former Smoker    Packs/day: 0.50    Years: 8.00    Pack years: 4.00    Types: Cigarettes    Last attempt to quit: 04/25/2011    Years since quitting: 7.1  . Smokeless tobacco: Never Used  Substance Use Topics  . Alcohol use: No  . Drug use: No     Allergies   Bee venom; Penicillins; Adhesive [tape]; Latex; and Vancomycin   Review of Systems Review of Systems  Constitutional: Negative for fever (last week now resolved).  Respiratory: Cough: resolved.   Gastrointestinal: Negative for nausea and vomiting.  Genitourinary: Negative for dysuria.  Musculoskeletal: Positive for back pain.  Neurological: Positive for numbness. Negative for weakness (reports has been weak since CS).     Physical Exam Updated Vital Signs BP (!) 128/52   Pulse 87   Temp 98.4 F (36.9 C) (Oral)   Resp 16   Ht 5\' 3"  (1.6 m)   Wt (!) 192.3 kg   SpO2 97%   BMI 75.11 kg/m   Physical Exam Vitals signs and nursing note reviewed.  Constitutional:      General: She is not in acute distress.    Appearance: She is well-developed. She is not diaphoretic.  HENT:     Head: Normocephalic and atraumatic.  Eyes:     Conjunctiva/sclera: Conjunctivae normal.  Neck:     Musculoskeletal: Normal range of motion.  Cardiovascular:  Rate and Rhythm: Normal rate and regular rhythm.     Heart sounds: Normal heart sounds. No murmur. No friction rub. No gallop.   Pulmonary:     Effort:  Pulmonary effort is normal. No respiratory distress.     Breath sounds: Normal breath sounds. No wheezing or rales.  Abdominal:     General: There is no distension.     Palpations: Abdomen is soft.     Tenderness: There is no abdominal tenderness. There is no guarding.     Comments: Mild erythema under pannus, no significant dermatitis   Genitourinary:    Comments: Brown stool on exam Normal rectal tone  Musculoskeletal:        General: No tenderness.  Skin:    General: Skin is warm and dry.     Findings: No erythema or rash.  Neurological:     Mental Status: She is alert and oriented to person, place, and time.     Comments: Numbness to bilateral feet in all distributions from calf down 5/5 strength with hip flexion/knee flexion/extension        ED Treatments / Results  Labs (all labs ordered are listed, but only abnormal results are displayed) Labs Reviewed  CBG MONITORING, ED    EKG None  Radiology No results found.  Procedures Procedures (including critical care time)  Medications Ordered in ED Medications  dexamethasone (DECADRON) injection 10 mg (10 mg Intravenous Given 06/11/18 1854)  ketorolac (TORADOL) injection 60 mg (60 mg Intramuscular Given 06/11/18 1855)  cyclobenzaprine (FLEXERIL) tablet 10 mg (10 mg Oral Given 06/11/18 1854)     Initial Impression / Assessment and Plan / ED Course  I have reviewed the triage vital signs and the nursing notes.  Pertinent labs & imaging results that were available during my care of the patient were reviewed by me and considered in my medical decision making (see chart for details).        43 year old female with history of  depression, COPD, hyperlipidemia, (pt denies DM hx) who presents with concern for back pain.  While she had an episode of urinary incontinence one week ago, and bowel incontinence one episode 3 weeks ago, she has no urinary retention, was normal post void residual, normal rectal tone, good  strength on exam and doubt cauda equina.  Numbness may be secondary to spinal pathology but given stocking distribution suspect neuropathy.  Suspect acute on chronic back pain, likely disc disease. Recommend outpatient spine surgery follow up. Will place on steroids, recommend flexeril, tylenol, aleve, lidocaine patch and outpt follow up. Patient discharged in stable condition with understanding of reasons to return.   Final Clinical Impressions(s) / ED Diagnoses   Final diagnoses:  Chronic bilateral low back pain with sciatica, sciatica laterality unspecified    ED Discharge Orders         Ordered    lidocaine (LIDODERM) 5 %  Every 24 hours     06/11/18 1842    cyclobenzaprine (FLEXERIL) 10 MG tablet  2 times daily PRN     06/11/18 1842    predniSONE (DELTASONE) 10 MG tablet  Daily     06/11/18 1842           Gareth Morgan, MD 06/12/18 0104

## 2018-06-12 LAB — CBC WITH DIFFERENTIAL
Basophils Absolute: 0 10*3/uL (ref 0.0–0.2)
Basos: 0 %
EOS (ABSOLUTE): 0.3 10*3/uL (ref 0.0–0.4)
Eos: 3 %
Hematocrit: 36.7 % (ref 34.0–46.6)
Hemoglobin: 11.5 g/dL (ref 11.1–15.9)
Immature Grans (Abs): 0.1 10*3/uL (ref 0.0–0.1)
Immature Granulocytes: 1 %
LYMPHS ABS: 1.8 10*3/uL (ref 0.7–3.1)
Lymphs: 19 %
MCH: 25.3 pg — AB (ref 26.6–33.0)
MCHC: 31.3 g/dL — AB (ref 31.5–35.7)
MCV: 81 fL (ref 79–97)
MONOS ABS: 0.9 10*3/uL (ref 0.1–0.9)
Monocytes: 9 %
NEUTROS ABS: 6.6 10*3/uL (ref 1.4–7.0)
Neutrophils: 68 %
RBC: 4.54 x10E6/uL (ref 3.77–5.28)
RDW: 14.3 % (ref 11.7–15.4)
WBC: 9.7 10*3/uL (ref 3.4–10.8)

## 2018-06-12 LAB — COMPREHENSIVE METABOLIC PANEL
ALBUMIN: 3.9 g/dL (ref 3.8–4.8)
ALK PHOS: 64 IU/L (ref 39–117)
ALT: 14 IU/L (ref 0–32)
AST: 15 IU/L (ref 0–40)
Albumin/Globulin Ratio: 1.2 (ref 1.2–2.2)
BILIRUBIN TOTAL: 0.2 mg/dL (ref 0.0–1.2)
BUN / CREAT RATIO: 28 — AB (ref 9–23)
BUN: 16 mg/dL (ref 6–24)
CHLORIDE: 101 mmol/L (ref 96–106)
CO2: 26 mmol/L (ref 20–29)
Calcium: 9.3 mg/dL (ref 8.7–10.2)
Creatinine, Ser: 0.57 mg/dL (ref 0.57–1.00)
GFR calc Af Amer: 134 mL/min/{1.73_m2} (ref >59)
GFR calc non Af Amer: 116 mL/min/{1.73_m2} (ref >59)
GLOBULIN, TOTAL: 3.2 g/dL (ref 1.5–4.5)
Glucose: 80 mg/dL (ref 65–99)
Potassium: 4.5 mmol/L (ref 3.5–5.2)
Sodium: 140 mmol/L (ref 134–144)
Total Protein: 7.1 g/dL (ref 6.0–8.5)

## 2018-06-12 LAB — LIPID PANEL WITH LDL/HDL RATIO
Cholesterol, Total: 115 mg/dL (ref 100–199)
HDL: 43 mg/dL (ref >39)
LDL Calculated: 48 mg/dL (ref 0–99)
LDl/HDL Ratio: 1.1 ratio (ref 0.0–3.2)
Triglycerides: 120 mg/dL (ref 0–149)
VLDL Cholesterol Cal: 24 mg/dL (ref 5–40)

## 2018-06-12 LAB — T4, FREE: FREE T4: 1.13 ng/dL (ref 0.82–1.77)

## 2018-06-12 LAB — T3: T3 TOTAL: 112 ng/dL (ref 71–180)

## 2018-06-12 LAB — FOLATE: FOLATE: 17.2 ng/mL (ref >3.0)

## 2018-06-12 LAB — HEMOGLOBIN A1C
Est. average glucose Bld gHb Est-mCnc: 111 mg/dL
Hgb A1c MFr Bld: 5.5 % (ref 4.8–5.6)

## 2018-06-12 LAB — VITAMIN D 25 HYDROXY (VIT D DEFICIENCY, FRACTURES): Vit D, 25-Hydroxy: 37.4 ng/mL (ref 30.0–100.0)

## 2018-06-12 LAB — VITAMIN B12: VITAMIN B 12: 1125 pg/mL (ref 232–1245)

## 2018-06-12 LAB — INSULIN, RANDOM: INSULIN: 36 u[IU]/mL — ABNORMAL HIGH (ref 2.6–24.9)

## 2018-06-12 LAB — TSH: TSH: 2.22 u[IU]/mL (ref 0.450–4.500)
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
## 2018-06-16 DIAGNOSIS — Z6841 Body Mass Index (BMI) 40.0 and over, adult: Secondary | ICD-10-CM

## 2018-06-18 ENCOUNTER — Ambulatory Visit (INDEPENDENT_AMBULATORY_CARE_PROVIDER_SITE_OTHER): Payer: Medicare HMO | Admitting: Bariatrics

## 2018-06-22 ENCOUNTER — Emergency Department (HOSPITAL_COMMUNITY)
Admission: EM | Admit: 2018-06-22 | Discharge: 2018-06-22 | Disposition: A | Discharge status: Home / Self Care | Payer: Medicare HMO | Attending: Emergency Medicine | Admitting: Emergency Medicine

## 2018-06-22 ENCOUNTER — Encounter (HOSPITAL_COMMUNITY): Payer: Self-pay | Admitting: Emergency Medicine

## 2018-06-22 ENCOUNTER — Emergency Department (HOSPITAL_COMMUNITY): Payer: Medicare HMO

## 2018-06-22 ENCOUNTER — Other Ambulatory Visit: Payer: Self-pay

## 2018-06-22 DIAGNOSIS — M5126 Other intervertebral disc displacement, lumbar region: Secondary | ICD-10-CM | POA: Diagnosis not present

## 2018-06-22 DIAGNOSIS — Z91040 Latex allergy status: Secondary | ICD-10-CM | POA: Insufficient documentation

## 2018-06-22 DIAGNOSIS — E119 Type 2 diabetes mellitus without complications: Secondary | ICD-10-CM | POA: Diagnosis not present

## 2018-06-22 DIAGNOSIS — M5416 Radiculopathy, lumbar region: Secondary | ICD-10-CM | POA: Diagnosis not present

## 2018-06-22 DIAGNOSIS — E7800 Pure hypercholesterolemia, unspecified: Secondary | ICD-10-CM | POA: Diagnosis not present

## 2018-06-22 DIAGNOSIS — I4510 Unspecified right bundle-branch block: Secondary | ICD-10-CM | POA: Diagnosis not present

## 2018-06-22 DIAGNOSIS — Z79899 Other long term (current) drug therapy: Secondary | ICD-10-CM | POA: Insufficient documentation

## 2018-06-22 DIAGNOSIS — M5124 Other intervertebral disc displacement, thoracic region: Secondary | ICD-10-CM | POA: Diagnosis not present

## 2018-06-22 DIAGNOSIS — F331 Major depressive disorder, recurrent, moderate: Secondary | ICD-10-CM | POA: Diagnosis not present

## 2018-06-22 DIAGNOSIS — J449 Chronic obstructive pulmonary disease, unspecified: Secondary | ICD-10-CM | POA: Diagnosis not present

## 2018-06-22 DIAGNOSIS — Z87891 Personal history of nicotine dependence: Secondary | ICD-10-CM | POA: Insufficient documentation

## 2018-06-22 DIAGNOSIS — F419 Anxiety disorder, unspecified: Secondary | ICD-10-CM | POA: Diagnosis not present

## 2018-06-22 DIAGNOSIS — M545 Low back pain: Secondary | ICD-10-CM | POA: Diagnosis not present

## 2018-06-22 DIAGNOSIS — R0602 Shortness of breath: Secondary | ICD-10-CM | POA: Diagnosis not present

## 2018-06-22 LAB — COMPREHENSIVE METABOLIC PANEL
ALBUMIN: 3.4 g/dL — AB (ref 3.5–5.0)
ALK PHOS: 54 U/L (ref 38–126)
ALT: 19 U/L (ref 0–44)
AST: 18 U/L (ref 15–41)
Anion gap: 10 (ref 5–15)
BILIRUBIN TOTAL: 0.6 mg/dL (ref 0.3–1.2)
BUN: 11 mg/dL (ref 6–20)
CALCIUM: 9.5 mg/dL (ref 8.9–10.3)
CO2: 25 mmol/L (ref 22–32)
Chloride: 101 mmol/L (ref 98–111)
Creatinine, Ser: 0.86 mg/dL (ref 0.44–1.00)
GFR calc non Af Amer: >60 mL/min (ref >60)
Glucose, Bld: 88 mg/dL (ref 70–99)
POTASSIUM: 3.8 mmol/L (ref 3.5–5.1)
SODIUM: 136 mmol/L (ref 135–145)
TOTAL PROTEIN: 7.2 g/dL (ref 6.5–8.1)

## 2018-06-22 LAB — CBC WITH DIFFERENTIAL/PLATELET
Abs Immature Granulocytes: 0.12 10*3/uL — ABNORMAL HIGH (ref 0.00–0.07)
BASOS ABS: 0 10*3/uL (ref 0.0–0.1)
Basophils Relative: 0 %
EOS ABS: 0.5 10*3/uL (ref 0.0–0.5)
Eosinophils Relative: 5 %
HCT: 41 % (ref 36.0–46.0)
HEMOGLOBIN: 12.6 g/dL (ref 12.0–15.0)
Immature Granulocytes: 1 %
Lymphocytes Relative: 19 %
Lymphs Abs: 2.2 10*3/uL (ref 0.7–4.0)
MCH: 26.3 pg (ref 26.0–34.0)
MCHC: 30.7 g/dL (ref 30.0–36.0)
MCV: 85.4 fL (ref 80.0–100.0)
MONO ABS: 0.6 10*3/uL (ref 0.1–1.0)
Monocytes Relative: 5 %
NRBC: 0 % (ref 0.0–0.2)
Neutro Abs: 8.2 10*3/uL — ABNORMAL HIGH (ref 1.7–7.7)
Neutrophils Relative %: 70 %
Platelets: 396 10*3/uL (ref 150–400)
RBC: 4.8 MIL/uL (ref 3.87–5.11)
RDW: 16 % — AB (ref 11.5–15.5)
WBC: 11.7 10*3/uL — AB (ref 4.0–10.5)

## 2018-06-22 MED ORDER — HYDROCODONE-ACETAMINOPHEN 5-325 MG PO TABS: 1.0000 | ORAL_TABLET | ORAL | 0 refills | Status: DC | PRN

## 2018-06-22 MED ORDER — MORPHINE SULFATE (PF) 4 MG/ML IV SOLN
4.0000 mg | Freq: Once | INTRAVENOUS | Status: AC
  Administered 2018-06-22: 4 mg via INTRAVENOUS
  Filled 2018-06-22: qty 1

## 2018-06-22 MED ORDER — LORAZEPAM 2 MG/ML IJ SOLN: 2.0000 mg | Freq: Once | INTRAMUSCULAR | Status: DC

## 2018-06-22 MED ORDER — ONDANSETRON HCL 4 MG/2ML IJ SOLN
4.0000 mg | Freq: Once | INTRAMUSCULAR | Status: AC
  Administered 2018-06-22: 4 mg via INTRAVENOUS
  Filled 2018-06-22: qty 2

## 2018-06-22 MED ORDER — LORAZEPAM 2 MG/ML IJ SOLN: 2.0000 mg | INTRAMUSCULAR | Status: DC | PRN

## 2018-06-22 NOTE — Progress Notes (Signed)
[facility] RN called and spoke with unit RN. Advised [facility] is extremely busy, but will be there as soon as possible. [facility] RN asked that unit RN utilize resources in ED in meantime.

## 2018-06-22 NOTE — ED Notes (Signed)
Patient still not returned to room. RN called X-ray. X-ray reports patient is now at MRI. MRI called by RN stating no staff informed RN that patient was leaving department. Blood work to be obtained when patient returns.

## 2018-06-22 NOTE — ED Triage Notes (Signed)
Patient presents POV c/o bilateral leg tingling and intermittent numbness. Patient also states having loss of control of her bladder when walking.

## 2018-06-22 NOTE — Consult Note (Signed)
Chief Complaint   Chief Complaint  Patient presents with  . Numbness    HPI   Consult requested by: Dr Gilford Raid Reason for consult: LBP, lumbar HNP  HPI: Heather [name] is a 43 y.o. female who presented to the ER with worsening low back pain. Symptoms initially started about 2 weeks ago. Pain affects midline LB and radiates into bilateral anterior upper thighs. She went to [facility] ER 06/11/2018 and was treated with IM medications which she reports resolved her pain until yesterday when pain flared back up. She has been taking Aleve which does help take the edge off of her pain.Her pain is much improved since being treated in the ED. She reports 4 episodes of urinary incontinence over the past two weeks. Episodes occur while ambulating. She does endorse the sensation to urinate, but does not make it to the rest room in time.  Endorses sensation of complete void. She does complain of N/T in BLE.    She does have a history of chronic low back pain x1 year which she attributes to when she had her c section. She has been seen and evaluated by Dr Lynann Bologna ([facility]), but reports that due to her weight, she was not deemed a surgical candidate and was referred for [facility]. Unfortunately, she is unable to undergo ESI also due to her weight and not being able to fit on the procedure table. She has not yet followed back up with the practice.   Patient Active Problem List   Diagnosis Date Noted  . Class 3 severe obesity with serious comorbidity and body mass index (BMI) greater than or equal to 70 in adult ([place]) 06/16/2018  . Nexplanon insertion 03/27/2017  . Postpartum hypertension 03/05/2017  . Status post primary low transverse cesarean section 02/03/2017  . H/O pre-eclampsia 01/27/2017  . Gestational htn w/o significant proteinuria, third trimester 01/20/2017  . Mild persistent asthma without complication 40/98/1191  . Perennial allergic rhinitis 11/05/2016  . Mild persistent asthma with acute  exacerbation 11/05/2016  . Excess weight gain in pregnancy, second trimester 10/30/2016  . Anxiety and depression 11/07/2015  . Hard to intubate 11/07/2015  . Hypoglycemia 11/07/2015  . OSA (obstructive sleep apnea) 11/07/2015  . DM type 2 (diabetes mellitus, type 2) ([place]) 12/07/2014  . Panniculitis 12/06/2014  . Cellulitis, abdominal wall 11/11/2014  . Abdominal pain 07/14/2014  . Loose stools 07/14/2014  . Melena 01/27/2014  . Eosinophilic esophagitis 47/82/9562  . Bowel habit changes 04/28/2013  . Esophageal dysphagia 04/28/2013  . Insomnia due to mental disorder(327.02) 08/08/2011  . RLS (restless legs syndrome) 08/08/2011  . Adjustment disorder with depressed mood 08/06/2011  . Schizoaffective disorder, bipolar type ([place]) 08/02/2011    Class: Acute  . PALPITATIONS, OCCASIONAL 11/01/2009  . Bipolar disorder ([place]) 10/19/2008  . DISORDER, TOBACCO USE 08/25/2008  . Leukocytosis 07/28/2008  . ALLERGIC RHINITIS, SEASONAL 08/20/2007  . DYSMETABOLIC SYNDROME 13/11/6576  . Morbid obesity ([place]) 05/13/2006  . EXTERNAL HEMORRHOIDS 05/13/2006  . HYPERLIPIDEMIA 05/12/2006  . Essential hypertension 05/12/2006  . ASTHMA 05/12/2006  . GERD (gastroesophageal reflux disease) 05/12/2006  . OSTEOARTHRITIS 05/12/2006    PMH: Past Medical History:  Diagnosis Date  . Amenorrhea   . Anxiety   . Anxiety   . Arthritis   . Asthma   . Back pain   . Constipation   . COPD (chronic obstructive pulmonary disease) ([place])   . Delivery with history of C-section   . Depression   . Depression   .  Diabetes mellitus without complication ([place])   . Dysmenorrhea   . Dysrhythmia    DR Johnsie Cancel    . Ectopic pregnancy 2013  . Edema, lower extremity   . Eosinophilic esophagitis    Diagnosed at [facility] 06/16/2013, untreated  . Gallbladder problem   . GERD (gastroesophageal reflux disease)    HEARTBURN   TUMS  . Hard to intubate 11/07/2015  . High cholesterol   . IBS (irritable bowel syndrome)   .  Leukocytosis 07/28/2008   Qualifier: Diagnosis of  By: Jonna Munro MD, Roderic Scarce    . Morbid obesity ([place])   . Neuromuscular disorder (H[facility])    RESTLESS LEG   . Obesity   . Schizoaffective disorder, bipolar type ([place])   . Sepsis ([place]) 11/11/2014  . Shortness of breath    WITH EXERTION   . Sleep apnea    CPAP    PSH: Past Surgical History:  Procedure Laterality Date  . CESAREAN SECTION MULTI-GESTATIONAL N/A 02/03/2017   Procedure: CESAREAN SECTION MULTI-GESTATIONAL;  Surgeon: Jonnie Kind, MD;  Location: [facility];  Service: Obstetrics;  Laterality: N/A;  . CHOLECYSTECTOMY    . DENTAL SURGERY    . ESOPHAGOGASTRODUODENOSCOPY  May 2007   Dr. Gala Romney: Normal esophagus, stomach, D1, D2  . ESOPHAGOGASTRODUODENOSCOPY  06/16/2013   Dr. Carlton Adam, eosinophilic esophagitis, reactive gastropathy, no esophageal dilation  . TONSILLECTOMY    . TOOTH EXTRACTION  10/28/2011   Procedure: DENTAL RESTORATION/EXTRACTIONS;  Surgeon: Gae Bon, DDS;  Location: [facility] OR;  Service: Oral Surgery;;    (Not in a hospital admission)   SH: Social History   Tobacco Use  . Smoking status: Former Smoker    Packs/day: 0.50    Years: 8.00    Pack years: 4.00    Types: Cigarettes    Last attempt to quit: 04/25/2011    Years since quitting: 7.1  . Smokeless tobacco: Never Used  Substance Use Topics  . Alcohol use: No  . Drug use: No    MEDS: Prior to Admission medications   Medication Sig Start Date End Date Taking? Authorizing Provider  albuterol (PROVENTIL HFA;VENTOLIN HFA) 108 (90 Base) MCG/ACT inhaler Inhale 2 puffs into the lungs every 4 (four) hours as needed for wheezing or shortness of breath. 05/27/17   Valentina Shaggy, MD  buPROPion (WELLBUTRIN XL) 300 MG 24 hr tablet Take 300 mg by mouth every morning.  04/28/14   [provider]  busPIRone (BUSPAR) 15 MG tablet Take 15 mg by mouth 2 (two) times daily. 05/30/18   [provider]  Cholecalciferol (VITAMIN D3) 5000  units CAPS Take 10,000 Units by mouth daily.     [provider]  citalopram (CELEXA) 40 MG tablet Take 40 mg by mouth at bedtime.     [provider]  cyclobenzaprine (FLEXERIL) 10 MG tablet Take 1 tablet (10 mg total) by mouth 2 (two) times daily as needed for muscle spasms. 06/11/18   Gareth Morgan, MD  fluticasone (FLOVENT HFA) 110 MCG/ACT inhaler Inhale 2 puffs into the lungs 2 (two) times daily. 05/27/17   Valentina Shaggy, MD  indapamide (LOZOL) 2.5 MG tablet Take 2.5 mg by mouth daily.    [provider]  lidocaine (LIDODERM) 5 % Place 1 patch onto the skin daily. Remove & Discard patch within 12 hours or as directed by MD 06/11/18   Gareth Morgan, MD  loratadine (CLARITIN) 10 MG tablet Take 10 mg by mouth daily.    [provider]  Methylsulfonylmethane (MSM) 1000 MG CAPS Take by mouth.    [provider]  montelukast (SINGULAIR) 10 MG tablet TAKE 1 TABLET(10 MG) BY MOUTH AT BEDTIME Patient taking differently: Take 10 mg by mouth at bedtime.  04/06/18   Valentina Shaggy, MD  Prenatal Vit-Fe Fumarate-FA (PRENATAL MULTIVITAMIN) TABS Take 1 tablet by mouth daily.     [provider]  rOPINIRole (REQUIP) 3 MG tablet Take 3 mg by mouth at bedtime.  05/01/17   [provider]  Turmeric 500 MG CAPS Take by mouth.    [provider]  vitamin B-12 (CYANOCOBALAMIN) 500 MCG tablet Take 500 mcg by mouth daily.    [provider]  vitamin E 400 UNIT capsule Take 400 Units by mouth daily.    [provider]    ALLERGY: Allergies  Allergen Reactions  . Bee Venom Shortness Of Breath  . Penicillins Anaphylaxis    Has patient had a PCN reaction causing immediate rash, facial/tongue/throat swelling, SOB or lightheadedness with hypotension: No Has patient had a PCN reaction causing severe rash involving mucus membranes or skin necrosis: No Has patient had a PCN reaction that required hospitalization No  Has patient had a PCN reaction occurring within the last 10 years: No If all of the above answers are "NO", then may proceed with Cephalosporin use.'  REACTION: Angioedema  . Adhesive [Tape] Rash  . Latex Rash  . Vancomycin Other (See Comments)    Pt can tolerate Vancomycin but did cause Red-Man Syndrome.  Recommend to pre-medicate with Benadryl before doses administered.      Social History   Tobacco Use  . Smoking status: Former Smoker    Packs/day: 0.50    Years: 8.00    Pack years: 4.00    Types: Cigarettes    Last attempt to quit: 04/25/2011    Years since quitting: 7.1  . Smokeless tobacco: Never Used  Substance Use Topics  . Alcohol use: No     Family History  Problem Relation Age of Onset  . Depression Mother   . Anxiety disorder Mother   . High blood pressure Mother   . Bipolar disorder Mother   . Eating disorder Mother   . Obesity Mother   . Hypertension Sister   . Allergic rhinitis Sister   . Colon polyps Maternal Grandmother        46s  . Diabetes Maternal Grandmother   . Anxiety disorder Maternal Grandmother   . COPD Maternal Grandmother   . Crohn's disease Maternal Aunt   . Cancer Maternal Grandfather        prostate  . HIV/AIDS Father   . Eating disorder Father   . Obesity Father   . Liver disease Neg Hx   . Angioedema Neg Hx   . Eczema Neg Hx   . Immunodeficiency Neg Hx   . Asthma Neg Hx   . Urticaria Neg Hx      ROS   Review of Systems  Constitutional: Negative.   HENT: Negative.   Eyes: Negative.   Respiratory: Negative.   Cardiovascular: Negative.   Gastrointestinal: Negative.   Genitourinary: Negative.   Musculoskeletal: Positive for back pain and myalgias. Negative for falls, joint pain and neck pain.  Skin: Negative.   Neurological: Positive for tingling (BLE). Negative for dizziness, tremors, sensory change, speech change, focal weakness, seizures, weakness and headaches.    Exam   Vitals:   06/22/18 1348 06/22/18 1430   BP: 118/60 139/76  Pulse: 89  88  Resp: 16 16  Temp:    SpO2: 98% 98%   General appearance: morbidity obese female, NAD Eyes: No scleral injection Cardiovascular: Regular rate and rhythm without murmurs, rubs, gallops. No edema or variciosities. Distal pulses normal. Pulmonary: Effort normal, non-labored breathing Musculoskeletal:     Muscle tone upper extremities: Normal    Muscle tone lower extremities: Normal    Motor exam: Upper Extremities Deltoid Bicep Tricep Grip  Right 5/5 5/5 5/5 5/5  Left 5/5 5/5 5/5 5/5   Lower Extremity IP Quad PF DF EHL  Right 5/5 5/5 5/5 5/5 5/5  Left 5/5 5/5 5/5 5/5 5/5   Neurological Mental Status:    - Patient is awake, alert, oriented to person, place, month, year, and situation    - Patient is able to give a clear and coherent history.    - No signs of aphasia or neglect Cranial Nerves    - II: Visual Fields are full. PERRL    - III/IV/VI: EOMI without ptosis or diploplia.     - V: Facial sensation is grossly normal    - VII: Facial movement is symmetric.     - VIII: hearing is intact to voice    - X: Uvula elevates symmetrically    - XI: Shoulder shrug is symmetric.    - XII: tongue is midline without atrophy or fasciculations.  Sensory: Sensation grossly intact to LT.  Results - Imaging/Labs   Results for orders placed or performed during the hospital encounter of 06/22/18 (from the past 48 hour(s))  CBC with Differential     Status: Abnormal   Collection Time: 06/22/18  2:01 PM  Result Value Ref Range   WBC 11.7 (H) 4.0 - 10.5 K/uL   RBC 4.80 3.87 - 5.11 MIL/uL   Hemoglobin 12.6 12.0 - 15.0 g/dL   HCT 41.0 36.0 - 46.0 %   MCV 85.4 80.0 - 100.0 fL   MCH 26.3 26.0 - 34.0 pg   MCHC 30.7 30.0 - 36.0 g/dL   RDW 16.0 (H) 11.5 - 15.5 %   Platelets 396 150 - 400 K/uL   nRBC 0.0 0.0 - 0.2 %   Neutrophils Relative % 70 %   Neutro Abs 8.2 (H) 1.7 - 7.7 K/uL   Lymphocytes Relative 19 %   Lymphs Abs 2.2 0.7 - 4.0 K/uL   Monocytes  Relative 5 %   Monocytes Absolute 0.6 0.1 - 1.0 K/uL   Eosinophils Relative 5 %   Eosinophils Absolute 0.5 0.0 - 0.5 K/uL   Basophils Relative 0 %   Basophils Absolute 0.0 0.0 - 0.1 K/uL   Immature Granulocytes 1 %   Abs Immature Granulocytes 0.12 (H) 0.00 - 0.07 K/uL    Comment: Performed at [facility], [address], [place], [place] [address]  Comprehensive metabolic panel     Status: Abnormal   Collection Time: 06/22/18  2:01 PM  Result Value Ref Range   Sodium 136 135 - 145 mmol/L   Potassium 3.8 3.5 - 5.1 mmol/L   Chloride 101 98 - 111 mmol/L   CO2 25 22 - 32 mmol/L   Glucose, Bld 88 70 - 99 mg/dL   BUN 11 6 - 20 mg/dL   Creatinine, Ser 0.86 0.44 - 1.00 mg/dL   Calcium 9.5 8.9 - 10.3 mg/dL   Total Protein 7.2 6.5 - 8.1 g/dL   Albumin 3.4 (L) 3.5 - 5.0 g/dL   AST 18 15 - 41 U/L  ALT 19 0 - 44 U/L   Alkaline Phosphatase 54 38 - 126 U/L   Total Bilirubin 0.6 0.3 - 1.2 mg/dL   GFR calc non Af Amer >60 >60 mL/min   GFR calc Af Amer >60 >60 mL/min   Anion gap 10 5 - 15    Comment: Performed at [facility] [address], [place], [place] [address]    Dg Chest 2 View  Result Date: 06/22/2018 CLINICAL DATA:  Shortness of breath, COPD, asthma EXAM: CHEST - 2 VIEW COMPARISON:  04/26/2015 FINDINGS: Upper-normal size of cardiac silhouette. Mediastinal contours and pulmonary vascularity normal. Lungs clear. No pulmonary infiltrate, pleural effusion or pneumothorax. Bones unremarkable. IMPRESSION: No acute abnormalities. Electronically Signed   By: Lavonia Dana M.D.   On: 06/22/2018 12:34   Mr Lumbar Spine Wo Contrast  Result Date: 06/22/2018 CLINICAL DATA:  Low back pain. BILATERAL leg tingling. Intermittent numbness. Loss of control of bladder when walking. Morbid obesity, EMR listed weight of 424 pounds with a BMI of 75.11. EXAM: MRI LUMBAR SPINE WITHOUT CONTRAST TECHNIQUE: Multiplanar, multisequence MR imaging of the lumbar spine was performed. No intravenous  contrast was administered. COMPARISON:  01/24/2018. FINDINGS: Image quality is reduced due to body habitus, potentially resulting in decreased sensitivity and specificity. Segmentation: Standard. Alignment:  3 mm anterolisthesis L5-S1 is facet mediated. Vertebrae: No visible compression fracture or worrisome osseous lesion. Conus medullaris and cauda equina: Conus extends to the L1 level. Conus and cauda equina appear normal. Paraspinal and other soft tissues: Increased body habitus without features of epidural lipomatosis. Disc levels: T12-L1: Large disc extrusion, with caudal and cephalad migration in the midline. There could be superimposed epidural hematoma. There is effacement of the ventral subarachnoid space, but the conus may be mildly displaced, but there is no frank conus compression or abnormal signal. L1-L2: Central protrusion. Facet arthropathy. No definite impingement. L2-L3: Disc space narrowing. Central and rightward protrusion with osseous spurring. Posterior element hypertrophy. RIGHT greater than LEFT L2 and L3 neural impingement are possible. L3-L4: Annular bulge. Posterior element hypertrophy. No definite impingement. L4-L5: Disc space narrowing. Central protrusion. Posterior element hypertrophy. Mild stenosis. Borderline L5 neural impingement. L5-S1: 3 mm anterolisthesis. Central protrusion with facet arthropathy and osseous spurring. Borderline stenosis. No subarticular zone narrowing, but BILATERAL foraminal narrowing could affect either L5 nerve root. Compared with prior exam, the disc extrusion at T12-L1 is new. IMPRESSION: Large disc extrusion, with caudal and cephalad migration in the midline. Superimposed epidural hematoma is not excluded. Effacement of ventral subarachnoid space, but no frank conus compression or abnormal cord signal. 3 mm anterolisthesis L5-S1 is facet mediated with superimposed central protrusion and posterior element hypertrophy. BILATERAL foraminal narrowing could  affect either L5 nerve root. Mild stenosis at L2-3 and L4-5 as described. This appears fairly similar to priors. Electronically Signed   By: Staci Righter M.D.   On: 06/22/2018 14:01   Impression/Plan   43 y.o. severly obese female with a BMI >70 who presents with midline LBP and what sounds more like urge incontinence. She is neurologically intact on exam.  MRI of Lumbar spine was reviewed with attending, Dr Kathyrn Sheriff. She has a large disc extrusion at T12-L1 with caudal and cephalad migration. There is effacement of the ventral sac but no frank conus compression. She otherwise has multilevel degenerative changes without severe central canal stenosis.   Since being in the ED, her pain is much improved. The MRI does not explain her incontinence and history is more consistent with  urge incontinence. There is no indication for emergent NS intervention. Rec she f/u with her spine surgeon for continued treatment.

## 2018-06-22 NOTE — ED Provider Notes (Signed)
[facility] EMERGENCY DEPARTMENT Provider Note   CSN: [id number] Arrival date & time: 06/22/18  1118    History   Chief Complaint Chief Complaint  Patient presents with  . Numbness    HPI Heather [name] is a 43 y.o. female.     Pt presents to the ED today with low back pain and pain radiating down the left leg.  She also has had urinary incontinence and numbness to her perineum.  She said the pain has been going on for several weeks, but the urinary incontinence just started.  She went to the ED on 3/5 for an eval and was put on steroids.  This did not help.     Past Medical History:  Diagnosis Date  . Amenorrhea   . Anxiety   . Anxiety   . Arthritis   . Asthma   . Back pain   . Constipation   . COPD (chronic obstructive pulmonary disease) ([place])   . Delivery with history of C-section   . Depression   . Depression   . Diabetes mellitus without complication ([place])   . Dysmenorrhea   . Dysrhythmia    DR Johnsie Cancel    . Ectopic pregnancy 2013  . Edema, lower extremity   . Eosinophilic esophagitis    Diagnosed at [facility] 06/16/2013, untreated  . Gallbladder problem   . GERD (gastroesophageal reflux disease)    HEARTBURN   TUMS  . Hard to intubate 11/07/2015  . High cholesterol   . IBS (irritable bowel syndrome)   . Leukocytosis 07/28/2008   Qualifier: Diagnosis of  By: Jonna Munro MD, Roderic Scarce    . Morbid obesity ([place])   . Neuromuscular disorder (HCC)    RESTLESS LEG   . Obesity   . Schizoaffective disorder, bipolar type ([place])   . Sepsis ([place]) 11/11/2014  . Shortness of breath    WITH EXERTION   . Sleep apnea    CPAP    Patient Active Problem List   Diagnosis Date Noted  . Class 3 severe obesity with serious comorbidity and body mass index (BMI) greater than or equal to 70 in adult ([place]) 06/16/2018  . Nexplanon insertion 03/27/2017  . Postpartum hypertension 03/05/2017  . Status post primary low transverse cesarean section 02/03/2017  . H/O  pre-eclampsia 01/27/2017  . Gestational htn w/o significant proteinuria, third trimester 01/20/2017  . Mild persistent asthma without complication 64/40/3474  . Perennial allergic rhinitis 11/05/2016  . Mild persistent asthma with acute exacerbation 11/05/2016  . Excess weight gain in pregnancy, second trimester 10/30/2016  . Anxiety and depression 11/07/2015  . Hard to intubate 11/07/2015  . Hypoglycemia 11/07/2015  . OSA (obstructive sleep apnea) 11/07/2015  . DM type 2 (diabetes mellitus, type 2) ([place]) 12/07/2014  . Panniculitis 12/06/2014  . Cellulitis, abdominal wall 11/11/2014  . Abdominal pain 07/14/2014  . Loose stools 07/14/2014  . Melena 01/27/2014  . Eosinophilic esophagitis 25/95/6387  . Bowel habit changes 04/28/2013  . Esophageal dysphagia 04/28/2013  . Insomnia due to mental disorder(327.02) 08/08/2011  . RLS (restless legs syndrome) 08/08/2011  . Adjustment disorder with depressed mood 08/06/2011  . Schizoaffective disorder, bipolar type ([place]) 08/02/2011    Class: Acute  . PALPITATIONS, OCCASIONAL 11/01/2009  . Bipolar disorder ([place]) 10/19/2008  . DISORDER, TOBACCO USE 08/25/2008  . Leukocytosis 07/28/2008  . ALLERGIC RHINITIS, SEASONAL 08/20/2007  . DYSMETABOLIC SYNDROME 56/43/3295  . Morbid obesity ([place]) 05/13/2006  . EXTERNAL HEMORRHOIDS 05/13/2006  . HYPERLIPIDEMIA 05/12/2006  . Essential hypertension  05/12/2006  . ASTHMA 05/12/2006  . GERD (gastroesophageal reflux disease) 05/12/2006  . OSTEOARTHRITIS 05/12/2006    Past Surgical History:  Procedure Laterality Date  . CESAREAN SECTION MULTI-GESTATIONAL N/A 02/03/2017   Procedure: CESAREAN SECTION MULTI-GESTATIONAL;  Surgeon: Jonnie Kind, MD;  Location: [facility];  Service: Obstetrics;  Laterality: N/A;  . CHOLECYSTECTOMY    . DENTAL SURGERY    . ESOPHAGOGASTRODUODENOSCOPY  May 2007   Dr. Gala Romney: Normal esophagus, stomach, D1, D2  . ESOPHAGOGASTRODUODENOSCOPY  06/16/2013   Dr. Carlton Adam,  eosinophilic esophagitis, reactive gastropathy, no esophageal dilation  . TONSILLECTOMY    . TOOTH EXTRACTION  10/28/2011   Procedure: DENTAL RESTORATION/EXTRACTIONS;  Surgeon: Gae Bon, DDS;  Location: [facility] OR;  Service: Oral Surgery;;     OB History    Gravida  2   Para  1   Term  1   Preterm      AB  1   Living  2     SAB  0   TAB      Ectopic  1   Multiple  1   Live Births  2            Home Medications    Prior to Admission medications   Medication Sig Start Date End Date Taking? Authorizing Provider  albuterol (PROVENTIL HFA;VENTOLIN HFA) 108 (90 Base) MCG/ACT inhaler Inhale 2 puffs into the lungs every 4 (four) hours as needed for wheezing or shortness of breath. 05/27/17   Valentina Shaggy, MD  buPROPion (WELLBUTRIN XL) 300 MG 24 hr tablet Take 300 mg by mouth every morning.  04/28/14   [provider]  busPIRone (BUSPAR) 15 MG tablet Take 15 mg by mouth 2 (two) times daily. 05/30/18   [provider]  Cholecalciferol (VITAMIN D3) 5000 units CAPS Take 10,000 Units by mouth daily.     [provider]  citalopram (CELEXA) 40 MG tablet Take 40 mg by mouth at bedtime.     [provider]  cyclobenzaprine (FLEXERIL) 10 MG tablet Take 1 tablet (10 mg total) by mouth 2 (two) times daily as needed for muscle spasms. 06/11/18   Gareth Morgan, MD  fluticasone (FLOVENT HFA) 110 MCG/ACT inhaler Inhale 2 puffs into the lungs 2 (two) times daily. 05/27/17   Valentina Shaggy, MD  HYDROcodone-acetaminophen (NORCO/VICODIN) 5-325 MG tablet Take 1 tablet by mouth every 4 (four) hours as needed. 06/22/18   Isla Pence, MD  indapamide (LOZOL) 2.5 MG tablet Take 2.5 mg by mouth daily.    [provider]  lidocaine (LIDODERM) 5 % Place 1 patch onto the skin daily. Remove & Discard patch within 12 hours or as directed by MD 06/11/18   Gareth Morgan, MD  loratadine (CLARITIN) 10 MG tablet Take 10 mg by mouth daily.     [provider]  Methylsulfonylmethane (MSM) 1000 MG CAPS Take by mouth.    [provider]  montelukast (SINGULAIR) 10 MG tablet TAKE 1 TABLET(10 MG) BY MOUTH AT BEDTIME Patient taking differently: Take 10 mg by mouth at bedtime.  04/06/18   Valentina Shaggy, MD  Prenatal Vit-Fe Fumarate-FA (PRENATAL MULTIVITAMIN) TABS Take 1 tablet by mouth daily.     [provider]  rOPINIRole (REQUIP) 3 MG tablet Take 3 mg by mouth at bedtime.  05/01/17   [provider]  Turmeric 500 MG CAPS Take by mouth.    [provider]  vitamin B-12 (CYANOCOBALAMIN) 500 MCG tablet Take 500 mcg  by mouth daily.    [provider]  vitamin E 400 UNIT capsule Take 400 Units by mouth daily.    [provider]    Family History Family History  Problem Relation Age of Onset  . Depression Mother   . Anxiety disorder Mother   . High blood pressure Mother   . Bipolar disorder Mother   . Eating disorder Mother   . Obesity Mother   . Hypertension Sister   . Allergic rhinitis Sister   . Colon polyps Maternal Grandmother        19s  . Diabetes Maternal Grandmother   . Anxiety disorder Maternal Grandmother   . COPD Maternal Grandmother   . Crohn's disease Maternal Aunt   . Cancer Maternal Grandfather        prostate  . HIV/AIDS Father   . Eating disorder Father   . Obesity Father   . Liver disease Neg Hx   . Angioedema Neg Hx   . Eczema Neg Hx   . Immunodeficiency Neg Hx   . Asthma Neg Hx   . Urticaria Neg Hx     Social History Social History   Tobacco Use  . Smoking status: Former Smoker    Packs/day: 0.50    Years: 8.00    Pack years: 4.00    Types: Cigarettes    Last attempt to quit: 04/25/2011    Years since quitting: 7.1  . Smokeless tobacco: Never Used  Substance Use Topics  . Alcohol use: No  . Drug use: No     Allergies   Bee venom; Penicillins; Adhesive [tape]; Latex; and Vancomycin   Review of Systems Review of  Systems  Genitourinary:       Urinary incontinence  Musculoskeletal: Positive for back pain.  All other systems reviewed and are negative.    Physical Exam Updated Vital Signs BP 124/72   Pulse 81   Temp 98.4 F (36.9 C) (Oral)   Resp 16   Ht 5\' 3"  (1.6 m)   Wt (!) 192.3 kg   SpO2 97%   BMI 75.11 kg/m   Physical Exam Vitals signs and nursing note reviewed.  Constitutional:      Appearance: Normal appearance. She is obese.  HENT:     Head: Normocephalic and atraumatic.     Right Ear: External ear normal.     Left Ear: External ear normal.     Nose: Nose normal.     Mouth/Throat:     Mouth: Mucous membranes are moist.  Eyes:     Extraocular Movements: Extraocular movements intact.     Pupils: Pupils are equal, round, and reactive to light.  Neck:     Musculoskeletal: Normal range of motion and neck supple.  Cardiovascular:     Rate and Rhythm: Normal rate and regular rhythm.     Pulses: Normal pulses.     Heart sounds: Normal heart sounds.  Pulmonary:     Effort: Pulmonary effort is normal.     Breath sounds: Normal breath sounds.  Abdominal:     General: Abdomen is flat. Bowel sounds are normal.     Palpations: Abdomen is soft.  Musculoskeletal: Normal range of motion.  Skin:    General: Skin is warm.     Capillary Refill: Capillary refill takes less than 2 seconds.  Neurological:     General: No focal deficit present.     Mental Status: She is alert and oriented to person, place, and time.  Psychiatric:  Mood and Affect: Mood normal.        Behavior: Behavior normal.      ED Treatments / Results  Labs (all labs ordered are listed, but only abnormal results are displayed) Labs Reviewed  CBC WITH DIFFERENTIAL/PLATELET - Abnormal; Notable for the following components:      Result Value   WBC 11.7 (*)    RDW 16.0 (*)    Neutro Abs 8.2 (*)    Abs Immature Granulocytes 0.12 (*)    All other components within normal limits  COMPREHENSIVE  METABOLIC PANEL - Abnormal; Notable for the following components:   Albumin 3.4 (*)    All other components within normal limits  URINALYSIS, ROUTINE W REFLEX MICROSCOPIC    EKG EKG Interpretation  Date/Time:  Monday June 22 2018 12:10:40 EDT Ventricular Rate:  76 PR Interval:    QRS Duration: 161 QT Interval:  441 QTC Calculation: 496 R Axis:   67 Text Interpretation:  Sinus rhythm Consider left atrial enlargement Right bundle branch block No significant change since Confirmed by Isla Pence [phone]) on 06/22/2018 12:20:54 PM Also confirmed by Isla Pence [phone]), editor Philomena Doheny [phone])  on 06/22/2018 1:20:30 PM   Radiology Dg Chest 2 View  Result Date: 06/22/2018 CLINICAL DATA:  Shortness of breath, COPD, asthma EXAM: CHEST - 2 VIEW COMPARISON:  04/26/2015 FINDINGS: Upper-normal size of cardiac silhouette. Mediastinal contours and pulmonary vascularity normal. Lungs clear. No pulmonary infiltrate, pleural effusion or pneumothorax. Bones unremarkable. IMPRESSION: No acute abnormalities. Electronically Signed   By: Lavonia Dana M.D.   On: 06/22/2018 12:34   Mr Lumbar Spine Wo Contrast  Result Date: 06/22/2018 CLINICAL DATA:  Low back pain. BILATERAL leg tingling. Intermittent numbness. Loss of control of bladder when walking. Morbid obesity, EMR listed weight of 424 pounds with a BMI of 75.11. EXAM: MRI LUMBAR SPINE WITHOUT CONTRAST TECHNIQUE: Multiplanar, multisequence MR imaging of the lumbar spine was performed. No intravenous contrast was administered. COMPARISON:  01/24/2018. FINDINGS: Image quality is reduced due to body habitus, potentially resulting in decreased sensitivity and specificity. Segmentation: Standard. Alignment:  3 mm anterolisthesis L5-S1 is facet mediated. Vertebrae: No visible compression fracture or worrisome osseous lesion. Conus medullaris and cauda equina: Conus extends to the L1 level. Conus and cauda equina appear normal. Paraspinal and other soft  tissues: Increased body habitus without features of epidural lipomatosis. Disc levels: T12-L1: Large disc extrusion, with caudal and cephalad migration in the midline. There could be superimposed epidural hematoma. There is effacement of the ventral subarachnoid space, but the conus may be mildly displaced, but there is no frank conus compression or abnormal signal. L1-L2: Central protrusion. Facet arthropathy. No definite impingement. L2-L3: Disc space narrowing. Central and rightward protrusion with osseous spurring. Posterior element hypertrophy. RIGHT greater than LEFT L2 and L3 neural impingement are possible. L3-L4: Annular bulge. Posterior element hypertrophy. No definite impingement. L4-L5: Disc space narrowing. Central protrusion. Posterior element hypertrophy. Mild stenosis. Borderline L5 neural impingement. L5-S1: 3 mm anterolisthesis. Central protrusion with facet arthropathy and osseous spurring. Borderline stenosis. No subarticular zone narrowing, but BILATERAL foraminal narrowing could affect either L5 nerve root. Compared with prior exam, the disc extrusion at T12-L1 is new. IMPRESSION: Large disc extrusion, with caudal and cephalad migration in the midline. Superimposed epidural hematoma is not excluded. Effacement of ventral subarachnoid space, but no frank conus compression or abnormal cord signal. 3 mm anterolisthesis L5-S1 is facet mediated with superimposed central protrusion and posterior element hypertrophy. BILATERAL foraminal narrowing could affect either  L5 nerve root. Mild stenosis at L2-3 and L4-5 as described. This appears fairly similar to priors. Electronically Signed   By: Staci Righter M.D.   On: 06/22/2018 14:01    Procedures Procedures (including critical care time)  Medications Ordered in ED Medications  LORazepam (ATIVAN) injection 2 mg (has no administration in time range)  morphine 4 MG/ML injection 4 mg (4 mg Intravenous Given 06/22/18 1405)  ondansetron (ZOFRAN)  injection 4 mg (4 mg Intravenous Given 06/22/18 1405)     Initial Impression / Assessment and Plan / ED Course  I have reviewed the triage vital signs and the nursing notes.  Pertinent labs & imaging results that were available during my care of the patient were reviewed by me and considered in my medical decision making (see chart for details).       Pt d/w NS.  They did come see her.  No conus compression.  NS does not want to do surgery on pt.  She has never tried PT, so we will set her up for PT.  Pt is stable for d/c.  Return if worse.  Final Clinical Impressions(s) / ED Diagnoses   Final diagnoses:  Protrusion of lumbar intervertebral disc  Lumbar radiculopathy    ED Discharge Orders         Ordered    Ambulatory referral to Physical Therapy     06/22/18 1510    HYDROcodone-acetaminophen (NORCO/VICODIN) 5-325 MG tablet  Every 4 hours PRN     06/22/18 1511           Isla Pence, MD 06/22/18 1512

## 2018-06-22 NOTE — ED Notes (Signed)
Patient transported to X-ray 

## 2018-06-25 ENCOUNTER — Encounter (INDEPENDENT_AMBULATORY_CARE_PROVIDER_SITE_OTHER): Payer: Self-pay

## 2018-06-25 ENCOUNTER — Ambulatory Visit (INDEPENDENT_AMBULATORY_CARE_PROVIDER_SITE_OTHER): Payer: Medicare HMO | Admitting: Bariatrics

## 2018-06-25 NOTE — Progress Notes (Unsigned)
Office: [phone]  /  Fax: [phone]    Date: 06/29/2018  Time Seen: *** Duration: *** Provider: Glennie Isle, PsyD Type of Session: Intake for Individual Therapy  Type of Contact: Face-to-face  Informed Consent:The provider's role was explained to [facility] L Dicker. The provider reviewed and discussed issues of confidentiality, privacy, and limits therein. In addition to verbal informed consent, written informed consent for psychological services was obtained from [place] prior to the initial intake interview. Written consent included information concerning the practice, financial arrangements, and confidentiality and patients' rights. Since the clinic is not a 24/7 crisis center, mental health emergency resources were shared in the form of a handout, and the provider explained MyChart, e-mail, voicemail, and/or other messaging systems should be utilized only for non-emergency reasons. Leandria verbally acknowledged understanding of the aforementioned, and agreed to use mental health emergency resources discussed if needed. Moreover, Ilee agreed information may be shared with other [facility] providers as needed for coordination of care, and written consent was obtained.   Chief Complaint: Joeline was referred by Dr. Jearld Lesch due to depression with emotional eating behaviors. Per the note for the visit with Dr. Jearld Lesch on 06/11/2018, "Lakendra is struggling with emotional eating and using food for comfort to the extent that it is negatively impacting her health. She often snacks when she is not hungry. Pranathi sometimes feels she is out of control and then feels guilty that she made poor food choices. She has binge episodes once a week and she is eating before bedtime. She is attempting to work on behavior modification techniques to help reduce her emotional eating. She shows no sign of suicidal or homicidal ideations."  During today's appointment, Adileny  reported ***  Jonessa was asked to complete a questionnaire assessing various behaviors related to emotional eating. Keerat endorsed the following: {gbmoodandfood:21755}.  HPI: Per the note for the initial visit with Dr. Jearld Lesch on 06/11/2018, Susane eats out "almost everyday" and she does not like to cook. During the initial appointment with Dr. Jearld Lesch, Wells Guiles further reported experiencing the following: snacking frequently in the evenings, frequently drinking liquids with calories, frequently making poor food choices and struggling with emotional eating. Additionally, she shared she craves chocolate and pizza, but dislikes liver-n-onions and chitterlings.   During today's appointment, Mettie reported ***  Mental Status Examination: Marise arrived on time for the appointment. She presented as appropriately dressed and groomed. Brigitt appeared her stated age and demonstrated adequate orientation to time, place, person, and purpose of the appointment. She also demonstrated appropriate eye contact. No psychomotor abnormalities or behavioral peculiarities noted. Her mood was {gbmood:21757} with congruent affect. Her thought processes were logical, linear, and goal-directed. No hallucinations, delusions, bizarre thinking or behavior reported or observed. Judgment, insight, and impulse control appeared to be grossly intact. There was no evidence of paraphasias (i.e., errors in speech, gross mispronunciations, and word substitutions), repetition deficits, or disturbances in volume or prosody (i.e., rhythm and intonation). There was no evidence of attention or memory impairments. Harris denied current suicidal and homicidal ideation, plan, and intent.   The Mini-Mental State Examination, Second Edition (MMSE-2) was administered. The MMSE-2 briefly screens for cognitive dysfunction and overall mental status and assesses different cognitive domains: orientation, registration, attention and  calculation, recall, and language and praxis. Sequita received *** out of 30 points possible on the MMSE-2, which is noted in the *** range.   Family & Psychosocial History: ***  Medical History: ***  Mental Health  History: ***  Ercia denied a trauma history, including {gbtrauma:22071} abuse, as well as neglect. ***  Shonita reported experiencing the following: {gbintakesxs:21966}  Hanya reported experiencing worry thoughts regarding the following:   Zhanna denied experiencing the following: {gbsxs:21965}  She also denied history of and current suicidal ideation, plan, and intent; history of and current homicidal ideation, plan, and intent; and history of and current engagement in self-harm.  The following strengths were reported by Wells Guiles:*** The following strengths were observed by this provider: {gbstrengths:22223}.  Legal History: Neita {gblegal:22371} a history of legal involvement.   Structured Assessment Results: The Patient Health Questionnaire-9 (PHQ-9) is a self-report measure that assesses symptoms and severity of depression over the course of the last two weeks. Mariella obtained a score of *** suggesting {GBPHQ9SEVERITY:21752}. Jessyka finds the endorsed symptoms to be {gbphq9difficulty:21754}.  The Generalized Anxiety Disorder-7 (GAD-7) is a brief self-report measure that assesses symptoms of anxiety over the course of the last two weeks. Maudene obtained a score of *** suggesting {gbgad7severity:21753}.  Interventions: A chart review was conducted prior to the clinical intake interview. The MMSE-2***, PHQ-9, and GAD-7 were administered and a clinical intake interview was completed. In addition, Shada was asked to complete a Mood and Food questionnaire to assess various behaviors related to emotional eating. Throughout session, empathic reflections and validation was provided. Continuing treatment with this provider was discussed and a treatment goal was established.  Psychoeducation regarding emotional versus physical hunger was provided. Aalaiyah was given a handout to utilize between now and the next appointment to increase awareness of hunger patterns and subsequent eating. ***  Provisional DSM-5 Diagnosis: ***  Plan: Emelie appears able and willing to participate as evidenced by collaboration on a treatment goal, engagement in reciprocal conversation, and asking questions as needed for clarification. The next appointment will be scheduled in {gbweeks:21758}. The following treatment goal was established: {gbtxgoals:21759}. For the aforementioned goal, Maday can benefit from biweekly individual therapy sessions that are brief in duration for approximately four to six sessions. The treatment modality will be individual therapeutic services, including an eclectic therapeutic approach utilizing techniques from Cognitive Behavioral Therapy, Patient Centered Therapy, Dialectical Behavior Therapy, Acceptance and Commitment Therapy, Interpersonal Therapy, and Cognitive Restructuring. Therapeutic approach will include various interventions as appropriate, such as validation, support, mindfulness, thought defusion, reframing, psychoeducation, values assessment, and role playing. This provider will regularly review the treatment plan and medical chart to keep informed of status changes. Alliene expressed understanding and agreement with the initial treatment plan of care.

## 2018-06-29 ENCOUNTER — Ambulatory Visit (INDEPENDENT_AMBULATORY_CARE_PROVIDER_SITE_OTHER): Payer: Medicare HMO | Admitting: Psychology

## 2018-06-29 DIAGNOSIS — F419 Anxiety disorder, unspecified: Secondary | ICD-10-CM | POA: Diagnosis not present

## 2018-06-29 DIAGNOSIS — F331 Major depressive disorder, recurrent, moderate: Secondary | ICD-10-CM | POA: Diagnosis not present

## 2018-06-30 ENCOUNTER — Encounter (INDEPENDENT_AMBULATORY_CARE_PROVIDER_SITE_OTHER): Payer: Self-pay

## 2018-06-30 ENCOUNTER — Ambulatory Visit (INDEPENDENT_AMBULATORY_CARE_PROVIDER_SITE_OTHER): Payer: Medicare HMO | Admitting: Bariatrics

## 2018-07-02 ENCOUNTER — Telehealth: Payer: Medicare HMO | Admitting: Physician Assistant

## 2018-07-02 DIAGNOSIS — R0602 Shortness of breath: Secondary | ICD-10-CM

## 2018-07-02 DIAGNOSIS — R079 Chest pain, unspecified: Secondary | ICD-10-CM

## 2018-07-02 DIAGNOSIS — R6889 Other general symptoms and signs: Secondary | ICD-10-CM

## 2018-07-02 NOTE — Progress Notes (Signed)
Based on what you shared with me, I feel your condition warrants further evaluation and I recommend that you be seen for a face to face office visit.  Giving fever with chest pain and SOB, you require assessment of your oxygen levels, lung exam and potentially x-ray or other imaging. I would recommend you be seen in the ER.      NOTE: If you entered your credit card information for this eVisit, you will not be charged. You may see a "hold" on your card for the $35 but that hold will drop off and you will not have a charge processed.  If you are having a true medical emergency please call 911.  If you need an urgent face to face visit, [facility] has four urgent care centers for your convenience.    PLEASE NOTE: THE [facility] LOCATIONS AND URGENT CARE CLINICS DO NOT HAVE THE TESTING FOR CORONAVIRUS COVID19 AVAILABLE.  IF YOU FEEL YOU NEED THIS TEST YOU MUST HAVE AN ORDER TO GO TO A TESTING LOCATION FROM YOUR PROVIDER OR FROM A SCREENING E-VISIT     [email or web address] to reserve your spot online an avoid wait times  [facility] [address] [place], [place] [address] 8 am to 8 pm Monday-Friday 10 am to 4 pm Saturday-Sunday *Across the street from [employer]  [place], [address] 8 am to 5 pm Monday-Friday * In the [facility] on the [facility]   The following sites will take your insurance:  . [facility] [place] a Provider at this Location  [address] [place], [place] [address] . 10 am to 8 pm Monday-Friday . 12 pm to 8 pm Saturday-Sunday   . [facility] [place] a Provider at this Location  [address], [place], [place] [address] . 8 am to 8 pm Monday-Friday . 9 am to 6 pm Saturday . 11 am to 6 pm Sunday   . [facility] at  [place] Get Driving Directions  [address], [place] [address] . 8 am to 8 pm Monday-Friday . 8 am to 4 pm Saturday-Sunday   Your e-visit answers were reviewed by a board certified advanced clinical practitioner to complete your personal care plan.  Thank you for using e-Visits.

## 2018-07-15 DIAGNOSIS — F331 Major depressive disorder, recurrent, moderate: Secondary | ICD-10-CM | POA: Diagnosis not present

## 2018-07-15 DIAGNOSIS — F419 Anxiety disorder, unspecified: Secondary | ICD-10-CM | POA: Diagnosis not present

## 2018-07-16 ENCOUNTER — Telehealth: Payer: Self-pay

## 2018-07-16 NOTE — Telephone Encounter (Signed)
Due to current COVID 19 pandemic, our office is severely reducing in office visits for at least the next 2 weeks, in order to minimize the risk to our patients and healthcare providers.   I called pt, offered her a sooner sleep consult visit with Dr. Rexene Alberts to be done virtually. She would prefer to keep her appt on 08/04/2018 but is agreeable to doing it virtually.  Pt understands that although there may be some limitations with this type of visit, we will take all precautions to reduce any security or privacy concerns.  Pt understands that this will be treated like an in office visit and we will file with pt's insurance, and there may be a patient responsible charge related to this service.  Pt's email is [email or web address]. Pt understands that the [employer] webex software must be downloaded and operational on the device pt plans to use for the visit.  Pt's meds, allergies, and PMH were updated.  Pt has had a sleep study years ago at [facility], was placed on cpap but could not tolerate it. She is not using cpap any longer. Pt does endorse snoring.  Pt's recent weight is 424 lb and she is 5'3.  Pt was instructed on how to measure her neck size prior to her visit.  Epworth Sleepiness Scale 0= would never doze 1= slight chance of dozing 2= moderate chance of dozing 3= high chance of dozing  Sitting and reading: 0 Watching TV: 0 Sitting inactive in a public place (ex. Theater or meeting): 0 As a passenger in a car for an hour without a break: 1 Lying down to rest in the afternoon: 0 Sitting and talking to someone: 0 Sitting quietly after lunch (no alcohol): 0 In a car, while stopped in traffic: 0 Total: 1  FSS: 34

## 2018-07-22 DIAGNOSIS — F331 Major depressive disorder, recurrent, moderate: Secondary | ICD-10-CM | POA: Diagnosis not present

## 2018-07-22 DIAGNOSIS — F419 Anxiety disorder, unspecified: Secondary | ICD-10-CM | POA: Diagnosis not present

## 2018-08-03 ENCOUNTER — Ambulatory Visit (INDEPENDENT_AMBULATORY_CARE_PROVIDER_SITE_OTHER): Payer: Medicare HMO | Admitting: Gastroenterology

## 2018-08-03 ENCOUNTER — Other Ambulatory Visit: Payer: Self-pay

## 2018-08-03 ENCOUNTER — Encounter: Payer: Self-pay | Admitting: Gastroenterology

## 2018-08-03 VITALS — Ht 63.0 in | Wt >= 6400 oz

## 2018-08-03 DIAGNOSIS — K200 Eosinophilic esophagitis: Secondary | ICD-10-CM | POA: Diagnosis not present

## 2018-08-03 DIAGNOSIS — K219 Gastro-esophageal reflux disease without esophagitis: Secondary | ICD-10-CM | POA: Diagnosis not present

## 2018-08-03 DIAGNOSIS — R194 Change in bowel habit: Secondary | ICD-10-CM

## 2018-08-03 MED ORDER — PANTOPRAZOLE SODIUM 40 MG PO TBEC: 40.0000 mg | DELAYED_RELEASE_TABLET | Freq: Two times a day (BID) | ORAL | 3 refills | Status: DC

## 2018-08-03 MED ORDER — PANTOPRAZOLE SODIUM 40 MG PO TBEC: 40.0000 mg | DELAYED_RELEASE_TABLET | Freq: Two times a day (BID) | ORAL | 2 refills | Status: DC

## 2018-08-03 NOTE — H&P (View-Only) (Signed)
Virtual Visit via Video Note  I connected with Clea Dubach Kalish on 08/03/18 at  8:30 AM EDT by a video enabled telemedicine application and verified that I am speaking with the correct person using two identifiers.   I discussed the limitations of evaluation and management by telemedicine and the availability of in person appointments. The patient expressed understanding and agreed to proceed.  THIS ENCOUNTER IS A VIRTUAL VISIT DUE TO COVID-19 - PATIENT WAS NOT SEEN IN THE OFFICE. PATIENT HAS CONSENTED TO VIRTUAL VISIT / TELEMEDICINE VISIT VIA DOXIMITY APP   Location of patient: home Location of provider: office Name of referring provider: Lucianne Lei, MD Persons participating: myself, patient  HPI :  43 y/o female history of morbid obesity (BMI 70s), asthma, reported EoE remotely, referred for change in bowel habits  Previously followed by Dr. Barney Drain of [facility]. She has also followed with [facility] in the past as well. She carries a diagnosis of EoE on review of records. Also with history of morbid obesity, weight of 424 lbs, BMI of 75.11  She reports she has had a change in bowel habits for the past month or so. Stool dark for about a month or so, comes and goes. Stools looser and black, sticky at times. Sometimes stools are normal in color / brown, then sometimes dark. She has a slight red tinge in the toilet paper when wiping herself. She has anywhere from 2-3 BMs per day. She does have nausea which is intermittent. She is has had 2 episodes of nonbloody emesis in the last month, normally eats without any problems. She has had some lower abdomen discomfort, she thinks this has been ongoing for a while, comes and goes. She has been having back pain with disc protrusion with some leg weakness, MRI shows disc protrusion. She is supposed to get an epidural for pain control, that is pending. She has been taking ibuprofen, OTC, not sure of mg, 2 at night every night. She has been  doing this at least a few weeks. That is the only NSAID she is taking. Nothing else for pain. She does not take iron supplements. She denies peptobismol use. She has some RLQ pain as well with distension of her abdomen at times which sounds chronic. She has had this intermittently in the past.   She has occasional indigestion. She has been protonix remotely. No dysphagia at all. She has been referred to allergy in the past for EoE, had no obvious food allergies. Her dysphagia appears to resolved with PPI in the past. Per review of chart, she has had some of these symptoms of melena in the past about 5 years ago. She also had plans for colonoscopy to be done but could not be done at [facility] due to weight. This was scheduled for [facility] but was not done, she also got pregnant during that time. She had a c-section 2 years ago. She thinks last EGD done prior to 2015, no records available.  She has no FH of known colon cancer or gastric / esophageal cancer. She does have an aunt with Crohn's disease.    Past Medical History:  Diagnosis Date  . Amenorrhea   . Anxiety   . Anxiety   . Arthritis   . Asthma   . Back pain   . Constipation   . COPD (chronic obstructive pulmonary disease) ([place])   . Delivery with history of C-section   . Depression   . Depression   .  Diabetes mellitus without complication ([place])   . Dysmenorrhea   . Dysrhythmia    DR Johnsie Cancel    . Ectopic pregnancy 2013  . Edema, lower extremity   . Eosinophilic esophagitis    Diagnosed at [facility] 06/16/2013, untreated  . Gallbladder problem   . GERD (gastroesophageal reflux disease)    HEARTBURN   TUMS  . Hard to intubate 11/07/2015  . High cholesterol   . IBS (irritable bowel syndrome)   . Leukocytosis 07/28/2008   Qualifier: Diagnosis of  By: Jonna Munro MD, Roderic Scarce    . Morbid obesity ([place])   . Neuromuscular disorder (HCC)    RESTLESS LEG   . Obesity   . Schizoaffective disorder, bipolar type ([place])   . Sepsis ([place])  11/11/2014  . Shortness of breath    WITH EXERTION   . Sleep apnea    CPAP     Past Surgical History:  Procedure Laterality Date  . CESAREAN SECTION MULTI-GESTATIONAL N/A 02/03/2017   Procedure: CESAREAN SECTION MULTI-GESTATIONAL;  Surgeon: Jonnie Kind, MD;  Location: [place];  Service: Obstetrics;  Laterality: N/A;  . CHOLECYSTECTOMY    . DENTAL SURGERY    . ESOPHAGOGASTRODUODENOSCOPY  May 2007   Dr. Gala Romney: Normal esophagus, stomach, D1, D2  . ESOPHAGOGASTRODUODENOSCOPY  06/16/2013   Dr. Carlton Adam, eosinophilic esophagitis, reactive gastropathy, no esophageal dilation  . TONSILLECTOMY    . TOOTH EXTRACTION  10/28/2011   Procedure: DENTAL RESTORATION/EXTRACTIONS;  Surgeon: Gae Bon, DDS;  Location: [facility] OR;  Service: Oral Surgery;;   Family History  Problem Relation Age of Onset  . Depression Mother   . Anxiety disorder Mother   . High blood pressure Mother   . Bipolar disorder Mother   . Eating disorder Mother   . Obesity Mother   . Hypertension Sister   . Allergic rhinitis Sister   . Colon polyps Maternal Grandmother        45s  . Diabetes Maternal Grandmother   . Anxiety disorder Maternal Grandmother   . COPD Maternal Grandmother   . Crohn's disease Maternal Aunt   . Cancer Maternal Grandfather        prostate  . HIV/AIDS Father   . Eating disorder Father   . Obesity Father   . Liver disease Neg Hx   . Angioedema Neg Hx   . Eczema Neg Hx   . Immunodeficiency Neg Hx   . Asthma Neg Hx   . Urticaria Neg Hx    Social History   Tobacco Use  . Smoking status: Former Smoker    Packs/day: 0.50    Years: 8.00    Pack years: 4.00    Types: Cigarettes    Last attempt to quit: 04/25/2011    Years since quitting: 7.2  . Smokeless tobacco: Never Used  Substance Use Topics  . Alcohol use: No  . Drug use: No   Current Outpatient Medications  Medication Sig Dispense Refill  . albuterol (PROVENTIL HFA;VENTOLIN HFA) 108 (90 Base) MCG/ACT inhaler Inhale  2 puffs into the lungs every 4 (four) hours as needed for wheezing or shortness of breath. 1 Inhaler 1  . buPROPion (WELLBUTRIN XL) 300 MG 24 hr tablet Take 300 mg by mouth every morning.   3  . busPIRone (BUSPAR) 15 MG tablet Take 15 mg by mouth 2 (two) times daily.    . Cholecalciferol (VITAMIN D3) 5000 units CAPS Take 10,000 Units by mouth daily.     . citalopram (CELEXA) 40 MG tablet Take  40 mg by mouth at bedtime.     . fluticasone (FLOVENT HFA) 110 MCG/ACT inhaler Inhale 2 puffs into the lungs 2 (two) times daily. 1 Inhaler 5  . indapamide (LOZOL) 2.5 MG tablet Take 2.5 mg by mouth daily.    Marland Kitchen lidocaine (LIDODERM) 5 % Place 1 patch onto the skin daily. Remove & Discard patch within 12 hours or as directed by MD 30 patch 0  . loratadine (CLARITIN) 10 MG tablet Take 10 mg by mouth daily.    . Methylsulfonylmethane (MSM) 1000 MG CAPS Take by mouth.    . montelukast (SINGULAIR) 10 MG tablet TAKE 1 TABLET(10 MG) BY MOUTH AT BEDTIME (Patient taking differently: Take 10 mg by mouth at bedtime. ) 30 tablet 5  . Prenatal Vit-Fe Fumarate-FA (PRENATAL MULTIVITAMIN) TABS Take 1 tablet by mouth daily.     Marland Kitchen rOPINIRole (REQUIP) 3 MG tablet Take 3 mg by mouth at bedtime.   0  . Turmeric 500 MG CAPS Take by mouth.    . vitamin B-12 (CYANOCOBALAMIN) 500 MCG tablet Take 500 mcg by mouth daily.    . vitamin E 400 UNIT capsule Take 400 Units by mouth daily.     No current facility-administered medications for this visit.    Allergies  Allergen Reactions  . Bee Venom Shortness Of Breath  . Penicillins Anaphylaxis    Has patient had a PCN reaction causing immediate rash, facial/tongue/throat swelling, SOB or lightheadedness with hypotension: No Has patient had a PCN reaction causing severe rash involving mucus membranes or skin necrosis: No Has patient had a PCN reaction that required hospitalization No Has patient had a PCN reaction occurring within the last 10 years: No If all of the above answers are  "NO", then may proceed with Cephalosporin use.'  REACTION: Angioedema  . Adhesive [Tape] Rash  . Latex Rash  . Vancomycin Other (See Comments)    Pt can tolerate Vancomycin but did cause Red-Man Syndrome.  Recommend to pre-medicate with Benadryl before doses administered.       Review of Systems: All systems reviewed and negative except where noted in HPI.   Lab Results  Component Value Date   WBC 11.7 (H) 06/22/2018   HGB 12.6 06/22/2018   HCT 41.0 06/22/2018   MCV 85.4 06/22/2018   PLT 396 06/22/2018    Lab Results  Component Value Date   CREATININE 0.86 06/22/2018   BUN 11 06/22/2018   NA 136 06/22/2018   K 3.8 06/22/2018   CL 101 06/22/2018   CO2 25 06/22/2018    Lab Results  Component Value Date   ALT 19 06/22/2018   AST 18 06/22/2018   ALKPHOS 54 06/22/2018   BILITOT 0.6 06/22/2018     Physical Exam: There were no vitals taken for this visit.  NA   ASSESSMENT AND PLAN:  43 y/o female here for new patient consultation regarding the following:  Change in bowel habits Morbid obesity GERD EoE   43 y/o female with a history of morbid obesity, reported EoE, back pain, presenting with one month of intermittent dark stools as outlined above in the setting of NSAID use. She also has some chronic lower abdominal pain. Prior CBC when seen in the ED in March showed a normal Hgb. I discussed ddx with her, I'm concerned about PUD / gastritis in light of her NSAID use. On review of her chart, she has been seen in 2015/2016 by Dr. Nona Dell office for similar symptoms concerning for melena, they  had set her up for EGD and colonoscopy at [facility] due to her anesthesia needs but she never followed up for that. She appeared stable on Doximity app and no cardiovascular symptoms right now.   Acutely I asked her to go to the lab today for CBC and labs to ensure she does not have significant bleeding, she agreed to do that, make sure the Hgb is stable. I asked her to stop all  NSAID use, and recommend tylenol for pain in the interim, with dosing instructions given. She does warrant EGD and colonoscopy at some point in time, question is when given the coronarvirus outbreak. Given her BMI (70s), she is higher risk for anesthesia, her case will need to be done at the hospital for anesthesia support. In light of the coronavirus outbreak, will need to get approval to do this case electively at the hospital. I counseled her that if she is having significant bleeding or anemia based on bloodwork we will need to admit her to the hospital for evaluation acutely. In the interim, will start her on protonix 40mg  BID (hold on omeprazole given celexa use) to empirically treat for PUD and treat her known GERD, await her labs, and will hopefully plan on scheduling these procedures in the near future as outpatient at the hospital. She agreed.  Bagnell Cellar, MD [facility]  CC: Lucianne Lei, MD

## 2018-08-03 NOTE — Progress Notes (Signed)
Virtual Visit via Video Note  I connected with Heather [name] on 08/03/18 at  8:30 AM EDT by a video enabled telemedicine application and verified that I am speaking with the correct person using two identifiers.   I discussed the limitations of evaluation and management by telemedicine and the availability of in person appointments. The patient expressed understanding and agreed to proceed.  THIS ENCOUNTER IS A VIRTUAL VISIT DUE TO COVID-19 - PATIENT WAS NOT SEEN IN THE OFFICE. PATIENT HAS CONSENTED TO VIRTUAL VISIT / TELEMEDICINE VISIT VIA DOXIMITY APP   Location of patient: home Location of provider: office Name of referring provider: Lucianne Lei, MD Persons participating: myself, patient  HPI :  43 y/o female history of morbid obesity (BMI 70s), asthma, reported EoE remotely, referred for change in bowel habits  Previously followed by Dr. Barney Drain of [facility]. She has also followed with [facility] in the past as well. She carries a diagnosis of EoE on review of records. Also with history of morbid obesity, weight of 424 lbs, BMI of 75.11  She reports she has had a change in bowel habits for the past month or so. Stool dark for about a month or so, comes and goes. Stools looser and black, sticky at times. Sometimes stools are normal in color / brown, then sometimes dark. She has a slight red tinge in the toilet paper when wiping herself. She has anywhere from 2-3 BMs per day. She does have nausea which is intermittent. She is has had 2 episodes of nonbloody emesis in the last month, normally eats without any problems. She has had some lower abdomen discomfort, she thinks this has been ongoing for a while, comes and goes. She has been having back pain with disc protrusion with some leg weakness, MRI shows disc protrusion. She is supposed to get an epidural for pain control, that is pending. She has been taking ibuprofen, OTC, not sure of mg, 2 at night every night. She has been  doing this at least a few weeks. That is the only NSAID she is taking. Nothing else for pain. She does not take iron supplements. She denies peptobismol use. She has some RLQ pain as well with distension of her abdomen at times which sounds chronic. She has had this intermittently in the past.   She has occasional indigestion. She has been protonix remotely. No dysphagia at all. She has been referred to allergy in the past for EoE, had no obvious food allergies. Her dysphagia appears to resolved with PPI in the past. Per review of chart, she has had some of these symptoms of melena in the past about 5 years ago. She also had plans for colonoscopy to be done but could not be done at [facility] due to weight. This was scheduled for [facility] but was not done, she also got pregnant during that time. She had a c-section 2 years ago. She thinks last EGD done prior to 2015, no records available.  She has no FH of known colon cancer or gastric / esophageal cancer. She does have an aunt with Crohn's disease.    Past Medical History:  Diagnosis Date  . Amenorrhea   . Anxiety   . Anxiety   . Arthritis   . Asthma   . Back pain   . Constipation   . COPD (chronic obstructive pulmonary disease) ([place])   . Delivery with history of C-section   . Depression   . Depression   .  Diabetes mellitus without complication ([place])   . Dysmenorrhea   . Dysrhythmia    DR Johnsie Cancel    . Ectopic pregnancy 2013  . Edema, lower extremity   . Eosinophilic esophagitis    Diagnosed at [facility] 06/16/2013, untreated  . Gallbladder problem   . GERD (gastroesophageal reflux disease)    HEARTBURN   TUMS  . Hard to intubate 11/07/2015  . High cholesterol   . IBS (irritable bowel syndrome)   . Leukocytosis 07/28/2008   Qualifier: Diagnosis of  By: Jonna Munro MD, Roderic Scarce    . Morbid obesity ([place])   . Neuromuscular disorder (HCC)    RESTLESS LEG   . Obesity   . Schizoaffective disorder, bipolar type ([place])   . Sepsis ([place])  11/11/2014  . Shortness of breath    WITH EXERTION   . Sleep apnea    CPAP     Past Surgical History:  Procedure Laterality Date  . CESAREAN SECTION MULTI-GESTATIONAL N/A 02/03/2017   Procedure: CESAREAN SECTION MULTI-GESTATIONAL;  Surgeon: Jonnie Kind, MD;  Location: [place];  Service: Obstetrics;  Laterality: N/A;  . CHOLECYSTECTOMY    . DENTAL SURGERY    . ESOPHAGOGASTRODUODENOSCOPY  May 2007   Dr. Gala Romney: Normal esophagus, stomach, D1, D2  . ESOPHAGOGASTRODUODENOSCOPY  06/16/2013   Dr. Carlton Adam, eosinophilic esophagitis, reactive gastropathy, no esophageal dilation  . TONSILLECTOMY    . TOOTH EXTRACTION  10/28/2011   Procedure: DENTAL RESTORATION/EXTRACTIONS;  Surgeon: Gae Bon, DDS;  Location: [facility] OR;  Service: Oral Surgery;;   Family History  Problem Relation Age of Onset  . Depression Mother   . Anxiety disorder Mother   . High blood pressure Mother   . Bipolar disorder Mother   . Eating disorder Mother   . Obesity Mother   . Hypertension Sister   . Allergic rhinitis Sister   . Colon polyps Maternal Grandmother        1s  . Diabetes Maternal Grandmother   . Anxiety disorder Maternal Grandmother   . COPD Maternal Grandmother   . Crohn's disease Maternal Aunt   . Cancer Maternal Grandfather        prostate  . HIV/AIDS Father   . Eating disorder Father   . Obesity Father   . Liver disease Neg Hx   . Angioedema Neg Hx   . Eczema Neg Hx   . Immunodeficiency Neg Hx   . Asthma Neg Hx   . Urticaria Neg Hx    Social History   Tobacco Use  . Smoking status: Former Smoker    Packs/day: 0.50    Years: 8.00    Pack years: 4.00    Types: Cigarettes    Last attempt to quit: 04/25/2011    Years since quitting: 7.2  . Smokeless tobacco: Never Used  Substance Use Topics  . Alcohol use: No  . Drug use: No   Current Outpatient Medications  Medication Sig Dispense Refill  . albuterol (PROVENTIL HFA;VENTOLIN HFA) 108 (90 Base) MCG/ACT inhaler Inhale  2 puffs into the lungs every 4 (four) hours as needed for wheezing or shortness of breath. 1 Inhaler 1  . buPROPion (WELLBUTRIN XL) 300 MG 24 hr tablet Take 300 mg by mouth every morning.   3  . busPIRone (BUSPAR) 15 MG tablet Take 15 mg by mouth 2 (two) times daily.    . Cholecalciferol (VITAMIN D3) 5000 units CAPS Take 10,000 Units by mouth daily.     . citalopram (CELEXA) 40 MG tablet Take  40 mg by mouth at bedtime.     . fluticasone (FLOVENT HFA) 110 MCG/ACT inhaler Inhale 2 puffs into the lungs 2 (two) times daily. 1 Inhaler 5  . indapamide (LOZOL) 2.5 MG tablet Take 2.5 mg by mouth daily.    Marland Kitchen lidocaine (LIDODERM) 5 % Place 1 patch onto the skin daily. Remove & Discard patch within 12 hours or as directed by MD 30 patch 0  . loratadine (CLARITIN) 10 MG tablet Take 10 mg by mouth daily.    . Methylsulfonylmethane (MSM) 1000 MG CAPS Take by mouth.    . montelukast (SINGULAIR) 10 MG tablet TAKE 1 TABLET(10 MG) BY MOUTH AT BEDTIME (Patient taking differently: Take 10 mg by mouth at bedtime. ) 30 tablet 5  . Prenatal Vit-Fe Fumarate-FA (PRENATAL MULTIVITAMIN) TABS Take 1 tablet by mouth daily.     Marland Kitchen rOPINIRole (REQUIP) 3 MG tablet Take 3 mg by mouth at bedtime.   0  . Turmeric 500 MG CAPS Take by mouth.    . vitamin B-12 (CYANOCOBALAMIN) 500 MCG tablet Take 500 mcg by mouth daily.    . vitamin E 400 UNIT capsule Take 400 Units by mouth daily.     No current facility-administered medications for this visit.    Allergies  Allergen Reactions  . Bee Venom Shortness Of Breath  . Penicillins Anaphylaxis    Has patient had a PCN reaction causing immediate rash, facial/tongue/throat swelling, SOB or lightheadedness with hypotension: No Has patient had a PCN reaction causing severe rash involving mucus membranes or skin necrosis: No Has patient had a PCN reaction that required hospitalization No Has patient had a PCN reaction occurring within the last 10 years: No If all of the above answers are  "NO", then may proceed with Cephalosporin use.'  REACTION: Angioedema  . Adhesive [Tape] Rash  . Latex Rash  . Vancomycin Other (See Comments)    Pt can tolerate Vancomycin but did cause Red-Man Syndrome.  Recommend to pre-medicate with Benadryl before doses administered.       Review of Systems: All systems reviewed and negative except where noted in HPI.   Lab Results  Component Value Date   WBC 11.7 (H) 06/22/2018   HGB 12.6 06/22/2018   HCT 41.0 06/22/2018   MCV 85.4 06/22/2018   PLT 396 06/22/2018    Lab Results  Component Value Date   CREATININE 0.86 06/22/2018   BUN 11 06/22/2018   NA 136 06/22/2018   K 3.8 06/22/2018   CL 101 06/22/2018   CO2 25 06/22/2018    Lab Results  Component Value Date   ALT 19 06/22/2018   AST 18 06/22/2018   ALKPHOS 54 06/22/2018   BILITOT 0.6 06/22/2018     Physical Exam: There were no vitals taken for this visit.  NA   ASSESSMENT AND PLAN:  43 y/o female here for new patient consultation regarding the following:  Change in bowel habits Morbid obesity GERD EoE   43 y/o female with a history of morbid obesity, reported EoE, back pain, presenting with one month of intermittent dark stools as outlined above in the setting of NSAID use. She also has some chronic lower abdominal pain. Prior CBC when seen in the ED in March showed a normal Hgb. I discussed ddx with her, I'm concerned about PUD / gastritis in light of her NSAID use. On review of her chart, she has been seen in 2015/2016 by Dr. Nona Dell office for similar symptoms concerning for melena, they  had set her up for EGD and colonoscopy at [facility] due to her anesthesia needs but she never followed up for that. She appeared stable on Doximity app and no cardiovascular symptoms right now.   Acutely I asked her to go to the lab today for CBC and labs to ensure she does not have significant bleeding, she agreed to do that, make sure the Hgb is stable. I asked her to stop all  NSAID use, and recommend tylenol for pain in the interim, with dosing instructions given. She does warrant EGD and colonoscopy at some point in time, question is when given the coronarvirus outbreak. Given her BMI (70s), she is higher risk for anesthesia, her case will need to be done at the hospital for anesthesia support. In light of the coronavirus outbreak, will need to get approval to do this case electively at the hospital. I counseled her that if she is having significant bleeding or anemia based on bloodwork we will need to admit her to the hospital for evaluation acutely. In the interim, will start her on protonix 40mg  BID (hold on omeprazole given celexa use) to empirically treat for PUD and treat her known GERD, await her labs, and will hopefully plan on scheduling these procedures in the near future as outpatient at the hospital. She agreed.  Elko Cellar, MD [facility]  CC: Lucianne Lei, MD

## 2018-08-03 NOTE — Patient Instructions (Addendum)
If you are age 41 or older, your body mass index should be between 23-30. Your Body mass index is 75.11 kg/m. If this is out of the aforementioned range listed, please consider follow up with your Primary Care Provider.  If you are age 39 or younger, your body mass index should be between 19-25. Your Body mass index is 75.11 kg/m. If this is out of the aformentioned range listed, please consider follow up with your Primary Care Provider.   To help prevent the possible spread of infection to our patients, communities, and staff; we will be implementing the following measures:  As of now we are not allowing any visitors/family members to accompany you to any upcoming appointments with [facility]. If you have any concerns about this please contact our office to discuss prior to the appointment.   Please go to the lab in the basement of our building to have lab work done (CBC and CMET). Hit "B" for basement when you get on the elevator.  When the doors open the lab is on your left.  We will call you with the results. Thank you.  Please discontinue talking all NSAIDs.  Use Tylenol as needed.  We have sent the following medications to your pharmacy for you to pick up at your convenience: Pantroprazole 40mg : Take twice daily  Thank you for entrusting me with your care and for choosing [facility], Dr. Nolan Cellar

## 2018-08-04 ENCOUNTER — Encounter: Payer: Self-pay | Admitting: Neurology

## 2018-08-04 ENCOUNTER — Other Ambulatory Visit: Payer: Self-pay

## 2018-08-04 ENCOUNTER — Other Ambulatory Visit (INDEPENDENT_AMBULATORY_CARE_PROVIDER_SITE_OTHER): Payer: Medicare HMO

## 2018-08-04 ENCOUNTER — Ambulatory Visit (INDEPENDENT_AMBULATORY_CARE_PROVIDER_SITE_OTHER): Payer: Medicare HMO | Admitting: Neurology

## 2018-08-04 DIAGNOSIS — G4733 Obstructive sleep apnea (adult) (pediatric): Secondary | ICD-10-CM | POA: Diagnosis not present

## 2018-08-04 DIAGNOSIS — Z6841 Body Mass Index (BMI) 40.0 and over, adult: Secondary | ICD-10-CM

## 2018-08-04 DIAGNOSIS — R351 Nocturia: Secondary | ICD-10-CM

## 2018-08-04 DIAGNOSIS — J449 Chronic obstructive pulmonary disease, unspecified: Secondary | ICD-10-CM | POA: Diagnosis not present

## 2018-08-04 DIAGNOSIS — K200 Eosinophilic esophagitis: Secondary | ICD-10-CM | POA: Diagnosis not present

## 2018-08-04 DIAGNOSIS — R51 Headache: Secondary | ICD-10-CM

## 2018-08-04 DIAGNOSIS — R194 Change in bowel habit: Secondary | ICD-10-CM

## 2018-08-04 DIAGNOSIS — K219 Gastro-esophageal reflux disease without esophagitis: Secondary | ICD-10-CM | POA: Diagnosis not present

## 2018-08-04 DIAGNOSIS — R519 Headache, unspecified: Secondary | ICD-10-CM

## 2018-08-04 LAB — CBC WITH DIFFERENTIAL/PLATELET
Basophils Absolute: 0.1 10*3/uL (ref 0.0–0.1)
Basophils Relative: 1.1 % (ref 0.0–3.0)
Eosinophils Absolute: 0.6 10*3/uL (ref 0.0–0.7)
Eosinophils Relative: 6.1 % — ABNORMAL HIGH (ref 0.0–5.0)
HCT: 41.1 % (ref 36.0–46.0)
Hemoglobin: 13.3 g/dL (ref 12.0–15.0)
Lymphocytes Relative: 23.9 % (ref 12.0–46.0)
Lymphs Abs: 2.3 10*3/uL (ref 0.7–4.0)
MCHC: 32.3 g/dL (ref 30.0–36.0)
MCV: 82.7 fl (ref 78.0–100.0)
Monocytes Absolute: 0.7 10*3/uL (ref 0.1–1.0)
Monocytes Relative: 7.3 % (ref 3.0–12.0)
Neutro Abs: 6 10*3/uL (ref 1.4–7.7)
Neutrophils Relative %: 61.6 % (ref 43.0–77.0)
Platelets: 346 10*3/uL (ref 150.0–400.0)
RBC: 4.97 Mil/uL (ref 3.87–5.11)
RDW: 17.3 % — ABNORMAL HIGH (ref 11.5–15.5)
WBC: 9.7 10*3/uL (ref 4.0–10.5)

## 2018-08-04 LAB — COMPREHENSIVE METABOLIC PANEL
ALT: 10 U/L (ref 0–35)
AST: 12 U/L (ref 0–37)
Albumin: 4.1 g/dL (ref 3.5–5.2)
Alkaline Phosphatase: 58 U/L (ref 39–117)
BUN: 15 mg/dL (ref 6–23)
CO2: 28 mEq/L (ref 19–32)
Calcium: 9.6 mg/dL (ref 8.4–10.5)
Chloride: 100 mEq/L (ref 96–112)
Creatinine, Ser: 0.74 mg/dL (ref 0.40–1.20)
GFR: 86.69 mL/min (ref >60.00)
Glucose, Bld: 86 mg/dL (ref 70–99)
Potassium: 4 mEq/L (ref 3.5–5.1)
Sodium: 136 mEq/L (ref 135–145)
Total Bilirubin: 0.3 mg/dL (ref 0.2–1.2)
Total Protein: 8.2 g/dL (ref 6.0–8.3)

## 2018-08-04 NOTE — Patient Instructions (Signed)
Given verbally, during today's virtual video-based encounter, with verbal feedback received.   

## 2018-08-04 NOTE — Progress Notes (Signed)
Star Age, MD, PhD [facility] [address] [place], [place] [address]   Virtual Visit via Video Note on 08/04/2018:  I connected with Heather [name] on 08/04/18 at  9:00 AM EDT by a video enabled telemedicine application and verified that I am speaking with the correct person using two identifiers.   I discussed the limitations of evaluation and management by telemedicine and the availability of in person appointments. The patient expressed understanding and agreed to proceed.  History of Present Illness: Ms. Heather [name] is a 43 year old right-handed woman with an underlying medical history of hypertension, hyperlipidemia, history of mood disorder, irritable bowel syndrome, reflux disease, lower extremity edema, COPD, back pain, arthritis, and morbid obesity with a BMI of over 70, with whom I am conducting a virtual, video based new patient visit via Webex in lieu of a face-to-face visit for evaluation of her sleep disorder, in particular, concern for underlying obstructive sleep apnea, for re-evaluation of her prior diagnosis. The patient is unaccompanied today (one year old daughter audible in background) and joins via laptop from home. She is referred by Dr. Jearld Lesch (with weight management clinic), and I reviewed her note from 06/11/2018.  Her prior sleep study was over 5 years ago, prior sleep study results are not available for my review today. She is not on CPAP therapy. She tried CPAP therapy but could not tolerate it at the time, reports difficulty tolerating the full facemask. She would be willing to get retested and consider CPAP therapy again at this point. Her Epworth sleepiness score is 1 out of 24, fatigue severity score is 34 out of 63.  Her bedtime is around 10 or 11, right time around 6 when she is working. 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 She lives with her mother and twin girls, age 28. She reports sleeping  a little better recently, as she had a lot of stress when her husband was still there, he recently left she reports. She has a family history of sleep apnea in her mother who uses a CPAP machine and she suspects in her maternal grandmother as well. She has nocturia about once or twice per average night and has had occasional morning headaches. She drinks caffeine occasionally, not Mr. Truman Hayward daily, in the form of coffee or soda. She does not drink alcohol regularly, quit smoking some 10 years ago. There is a TV in the bedroom which tends to stay on at night on low volume. They have a cat in the household, not in her bedroom at night. One of her twin girls sleeps with her at night and the other sleeps with the grandmother. Patient had a tonsillectomy.   Her Past Medical History Is Significant For: Past Medical History:  Diagnosis Date  . Amenorrhea   . Anxiety   . Anxiety   . Arthritis   . Asthma   . Back pain   . Constipation   . COPD (chronic obstructive pulmonary disease) ([place])   . Delivery with history of C-section   . Depression   . Depression   . Diabetes mellitus without complication ([place])   . Dysmenorrhea   . Dysrhythmia    DR Johnsie Cancel    . Ectopic pregnancy 2013  . Edema, lower extremity   . Eosinophilic esophagitis    Diagnosed at [facility] 06/16/2013, untreated  . Gallbladder problem   . GERD (gastroesophageal reflux disease)    HEARTBURN   TUMS  .  Hard to intubate 11/07/2015  . High cholesterol   . IBS (irritable bowel syndrome)   . Leukocytosis 07/28/2008   Qualifier: Diagnosis of  By: Jonna Munro MD, Roderic Scarce    . Morbid obesity ([place])   . Neuromuscular disorder ([facility])    RESTLESS LEG   . Obesity   . Schizoaffective disorder, bipolar type ([place])   . Sepsis ([place]) 11/11/2014  . Shortness of breath    WITH EXERTION   . Sleep apnea    CPAP    Her Past Surgical History Is Significant For: Past Surgical History:  Procedure Laterality Date  . CESAREAN SECTION MULTI-GESTATIONAL N/A  02/03/2017   Procedure: CESAREAN SECTION MULTI-GESTATIONAL;  Surgeon: Jonnie Kind, MD;  Location: [place];  Service: Obstetrics;  Laterality: N/A;  . CHOLECYSTECTOMY    . DENTAL SURGERY    . ESOPHAGOGASTRODUODENOSCOPY  May 2007   Dr. Gala Romney: Normal esophagus, stomach, D1, D2  . ESOPHAGOGASTRODUODENOSCOPY  06/16/2013   Dr. Carlton Adam, eosinophilic esophagitis, reactive gastropathy, no esophageal dilation  . TONSILLECTOMY    . TOOTH EXTRACTION  10/28/2011   Procedure: DENTAL RESTORATION/EXTRACTIONS;  Surgeon: Gae Bon, DDS;  Location: [facility];  Service: Oral Surgery;;    Her Family History Is Significant For: Family History  Problem Relation Age of Onset  . Depression Mother   . Anxiety disorder Mother   . High blood pressure Mother   . Bipolar disorder Mother   . Eating disorder Mother   . Obesity Mother   . Hypertension Sister   . Allergic rhinitis Sister   . Colon polyps Maternal Grandmother        49s  . Diabetes Maternal Grandmother   . Anxiety disorder Maternal Grandmother   . COPD Maternal Grandmother   . Crohn's disease Maternal Aunt   . Cancer Maternal Grandfather        prostate  . HIV/AIDS Father   . Eating disorder Father   . Obesity Father   . Liver disease Neg Hx   . Angioedema Neg Hx   . Eczema Neg Hx   . Immunodeficiency Neg Hx   . Asthma Neg Hx   . Urticaria Neg Hx     Her Social History Is Significant For: Social History   Socioeconomic History  . Marital status: Married    Spouse name: Gwyndolyn Saxon  . Number of children: 0  . Years of education: Not on file  . Highest education level: Not on file  Occupational History  . 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 Financial resource strain: Not on file  . Food insecurity:    Worry: Not on file    Inability: Not on file  . Transportation needs:    Medical: Not on file    Non-medical: Not on file  Tobacco Use  . Smoking status: Former Smoker    Packs/day: 0.50     Years: 8.00    Pack years: 4.00    Types: Cigarettes    Last attempt to quit: 04/25/2011    Years since quitting: 7.2  . Smokeless tobacco: Never Used  Substance and Sexual Activity  . Alcohol use: No  . Drug use: No  . Sexual activity: Not Currently    Birth control/protection: None    Comment: stopped smoking in jan.. had a pack the other day  Lifestyle  . Physical activity:    Days per week: Not on file    Minutes per session: Not on file  . Stress:  Not on file  Relationships  . Social connections:    Talks on phone: Not on file    Gets together: Not on file    Attends religious service: Not on file    Active member of club or organization: Not on file    Attends meetings of clubs or organizations: Not on file    Relationship status: Not on file  Other Topics Concern  . Not on file  Social History Narrative  . Not on file    Her Allergies Are:  Allergies  Allergen Reactions  . Bee Venom Shortness Of Breath  . Penicillins Anaphylaxis    Has patient had a PCN reaction causing immediate rash, facial/tongue/throat swelling, SOB or lightheadedness with hypotension: No Has patient had a PCN reaction causing severe rash involving mucus membranes or skin necrosis: No Has patient had a PCN reaction that required hospitalization No Has patient had a PCN reaction occurring within the last 10 years: No If all of the above answers are "NO", then may proceed with Cephalosporin use.'  REACTION: Angioedema  . Adhesive [Tape] Rash  . Latex Rash  . Vancomycin Other (See Comments)    Pt can tolerate Vancomycin but did cause Red-Man Syndrome.  Recommend to pre-medicate with Benadryl before doses administered.    :   Her Current Medications Are:  Outpatient Encounter Medications as of 08/04/2018  Medication Sig  . albuterol (PROVENTIL HFA;VENTOLIN HFA) 108 (90 Base) MCG/ACT inhaler Inhale 2 puffs into the lungs every 4 (four) hours as needed for wheezing or shortness of breath.  Marland Kitchen  buPROPion (WELLBUTRIN XL) 300 MG 24 hr tablet Take 300 mg by mouth every morning.   . busPIRone (BUSPAR) 15 MG tablet Take 20 mg by mouth 2 (two) times daily.   . Cholecalciferol (VITAMIN D3) 5000 units CAPS Take 10,000 Units by mouth daily.   . citalopram (CELEXA) 40 MG tablet Take 40 mg by mouth at bedtime.   . fluticasone (FLOVENT HFA) 110 MCG/ACT inhaler Inhale 2 puffs into the lungs 2 (two) times daily.  . indapamide (LOZOL) 2.5 MG tablet Take 2.5 mg by mouth daily.  Marland Kitchen loratadine (CLARITIN) 10 MG tablet Take 10 mg by mouth daily as needed.   . Methylsulfonylmethane (MSM) 1000 MG CAPS Take by mouth.  . montelukast (SINGULAIR) 10 MG tablet TAKE 1 TABLET(10 MG) BY MOUTH AT BEDTIME (Patient taking differently: Take 10 mg by mouth at bedtime. )  . pantoprazole (PROTONIX) 40 MG tablet Take 1 tablet (40 mg total) by mouth 2 (two) times daily.  . Prenatal Vit-Fe Fumarate-FA (PRENATAL MULTIVITAMIN) TABS Take 1 tablet by mouth daily.   Marland Kitchen rOPINIRole (REQUIP) 3 MG tablet Take 3 mg by mouth at bedtime.   . Turmeric 500 MG CAPS Take by mouth.  . vitamin B-12 (CYANOCOBALAMIN) 500 MCG tablet Take 500 mcg by mouth daily.  . vitamin E 400 UNIT capsule Take 400 Units by mouth daily.   Facility-Administered Encounter Medications as of 08/04/2018  Medication  . [COMPLETED] ipratropium (ATROVENT) nebulizer solution 0.5 mg  :   Review of Systems:  Out of a complete 14 point review of systems, all are reviewed and negative with the exception of these symptoms as listed below:  Observations/Objective: Her most recent vital signs on file for my review are from 06/22/2018: Blood pressure 124/72 with a pulse of 81, temperature 98.4, weight 424 pounds for a BMI of 75.11.     Her most recent weight at home by self report is 424  lb, neck circumference by self-report is 20 inches. On examination she is in no acute distress, pleasant, conversant, speech is clear, without dysarthria, hypophonia or voice tremor noted.  Face is symmetric with normal facial animation. Extraocular movements are well preserved. She has no obvious nystagmus. Hearing is grossly intact. Language skills normal. Airway examination reveals a smaller airway entry, uvula tip not fully visualized and tonsils are absent. Mallampati class II. She stands with some difficulty, sits on the couch and takes several attempts to stand. Posture is unremarkable. Romberg is negative. Finger to nose testing is normal. She has no drift, no postural or action tremor, no intention tremor. She walks without difficulty.  Assessment and Plan: Ms. Heather [name] is a 43 year old right-handed woman with an underlying medical history of hypertension, hyperlipidemia, history of mood disorder, irritable bowel syndrome, reflux disease, lower extremity edema, COPD, back pain, arthritis, and morbid obesity with a BMI of over 70, with whom I am conducting a virtual, video based new patient visit via Webex in lieu of a face-to-face visit for evaluation of an underlying organic sleep disorder, in particular, concern for obstructive sleep apnea. The patient's medical history and physical exam (albeit limited with current video-based evaluation) are concerning for a diagnosis of obstructive sleep apnea. I discussed with the patient the diagnosis of OSA, its prognosis and treatment options. I explained in particular the risks and ramifications of untreated moderate to severe OSA, especially with respect to developing cardiovascular disease down the Road, including congestive heart failure, difficult to treat hypertension, cardiac arrhythmias, or stroke. Even type 2 diabetes has, in part, been linked to untreated OSA. Symptoms of untreated OSA may include daytime sleepiness, memory problems, mood irritability and mood disorder such as depression and anxiety, lack of energy, as well as recurrent headaches, especially morning headaches. We talked about the importance of weight control. We  talked about the importance of maintaining good sleep hygiene. I recommended the following at this time: home sleep test.  I explained the sleep test procedure to the patient and also outlined possible treatment options of OSA, including the use of a custom-made dental device (which would require a referral to a specialist dentist), upper airway surgical options, (such as UPPP, which would involve a referral to an ENT). I also explained the CPAP vs. AutoPAP treatment option to the patient, who indicated that she would be willing to try CPAP if the need arises. I answered all her questions today and the patient was in agreement. I plan to see the patient back after the sleep study is completed and encouraged her to call with any interim questions, concerns, problems or updates.   Star Age, MD, PhD  Follow Up Instructions: 1. HST, sleep lab staff will reach out to patient to arrange for either sending the unit to home address or a "drive by pickup" and for tutorial, making sure patient is comfortable with the unit and usage, and return of equipment, if necessary.  2. Consider AutoPap therapy, if home sleep test positive for obstructive sleep apnea, patient agreeable. 3. We talked about alternative treatment options and current limitations, due to virus pandemic.  4. Follow-up after starting AutoPap therapy, follow-up to be scheduled according to set-up date, typically within 31 to 89 days post treatment start. 5. Pursue healthy lifestyle, good sleep hygiene, healthy weight. 6. Call for any interim questions or concerns.    I discussed the assessment and treatment plan with the patient. The patient was provided an opportunity to ask questions  and all were answered. The patient agreed with the plan and demonstrated an understanding of the instructions.   The patient was advised to call back or seek an in-person evaluation if the symptoms worsen or if the condition fails to improve as anticipated.   I provided 30 minutes of non-face-to-face time during this encounter.   Star Age, MD

## 2018-08-05 DIAGNOSIS — F419 Anxiety disorder, unspecified: Secondary | ICD-10-CM | POA: Diagnosis not present

## 2018-08-05 DIAGNOSIS — F331 Major depressive disorder, recurrent, moderate: Secondary | ICD-10-CM | POA: Diagnosis not present

## 2018-08-06 ENCOUNTER — Encounter: Payer: Self-pay | Admitting: Allergy & Immunology

## 2018-08-06 ENCOUNTER — Other Ambulatory Visit: Payer: Self-pay

## 2018-08-06 ENCOUNTER — Telehealth: Payer: Self-pay

## 2018-08-06 ENCOUNTER — Telehealth: Payer: Self-pay | Admitting: Gastroenterology

## 2018-08-06 ENCOUNTER — Ambulatory Visit (INDEPENDENT_AMBULATORY_CARE_PROVIDER_SITE_OTHER): Payer: Medicare HMO | Admitting: Allergy & Immunology

## 2018-08-06 DIAGNOSIS — R194 Change in bowel habit: Secondary | ICD-10-CM

## 2018-08-06 DIAGNOSIS — J3089 Other allergic rhinitis: Secondary | ICD-10-CM | POA: Diagnosis not present

## 2018-08-06 DIAGNOSIS — J4530 Mild persistent asthma, uncomplicated: Secondary | ICD-10-CM

## 2018-08-06 DIAGNOSIS — K219 Gastro-esophageal reflux disease without esophagitis: Secondary | ICD-10-CM

## 2018-08-06 DIAGNOSIS — J4520 Mild intermittent asthma, uncomplicated: Secondary | ICD-10-CM | POA: Diagnosis not present

## 2018-08-06 MED ORDER — FLUTICASONE PROPIONATE HFA 110 MCG/ACT IN AERO: 2.0000 | INHALATION_SPRAY | Freq: Two times a day (BID) | RESPIRATORY_TRACT | 5 refills | Status: DC

## 2018-08-06 MED ORDER — LORATADINE 10 MG PO TABS: 10.0000 mg | ORAL_TABLET | Freq: Every day | ORAL | 5 refills | Status: DC | PRN

## 2018-08-06 MED ORDER — ALBUTEROL SULFATE HFA 108 (90 BASE) MCG/ACT IN AERS: 2.0000 | INHALATION_SPRAY | RESPIRATORY_TRACT | 1 refills | Status: DC | PRN

## 2018-08-06 MED ORDER — FLUTICASONE PROPIONATE 50 MCG/ACT NA SUSP: 2.0000 | Freq: Every day | NASAL | 5 refills | Status: DC | PRN

## 2018-08-06 MED ORDER — MONTELUKAST SODIUM 10 MG PO TABS: 10.0000 mg | ORAL_TABLET | Freq: Every day | ORAL | 5 refills | Status: DC

## 2018-08-06 MED ORDER — TRIAMCINOLONE ACETONIDE 0.5 % EX OINT: TOPICAL_OINTMENT | CUTANEOUS | 1 refills | Status: DC

## 2018-08-06 NOTE — Telephone Encounter (Signed)
Patient reschedued secondary to COVID-19. ECL at [facility] on 08/13/18 @ 12:00 (noon). Pre-visit in and patient called with appts.

## 2018-08-06 NOTE — Progress Notes (Signed)
Instructions for EGD/Colon at [facility] on 5-7 mailed to patient prior to Previsit week of 08-10-18.

## 2018-08-06 NOTE — Telephone Encounter (Signed)
Called and spoke to pt. And let her know that May 18th is no longer a viable date for Dr. Havery Moros but asked if she could do the 7th of May. She confirmed she can and that her mom can take and bring her.  I am mailing her instructions and Sherlynn Stalls, RN will schedule her for a previsit early next week and will contact the patient to inform of dates and times of previsit and procedure.

## 2018-08-06 NOTE — Telephone Encounter (Signed)
-----   Message from Yetta Flock, MD sent at 08/05/2018  1:29 PM EDT ----- Mary Sella can you relay the results of this lab to the patient - normal Hgb and labs in general. If she has had bleeding causing change in her stool, it does not seem to be significant. Can you see if she is available to do EGD and colonoscopy at the hospital on the date we discussed? Thanks much

## 2018-08-06 NOTE — Progress Notes (Signed)
RE: Heather [name] MRN: [id number] DOB: [birth year] Date of Telemedicine Visit: 08/06/2018  Referring provider: Lucianne Lei, MD Primary care provider: Lucianne Lei, MD  Chief Complaint: Follow-up; Pruritus (small itchy bumps); and Medication Refill   Telemedicine Follow Up Visit via Telephone: I connected with Mandeep Utley for a follow up on 08/06/18 by telephone and verified that I am speaking with the correct person using two identifiers.   I discussed the limitations, risks, security and privacy concerns of performing an evaluation and management service by telephone and the availability of in person appointments. I also discussed with the patient that there may be a patient responsible charge related to this service. The patient expressed understanding and agreed to proceed.  Patient is at home.  Provider is at the office.  Visit start time: 9:56 AM Visit end time: 10:23 AM Insurance consent/check in by: [employer] consent and medical assistant/nurse: Ashleigh  History of Present Illness:  She is a 43 y.o. female, who is being followed for persistent asthma as well as allergic rhinitis. Her previous allergy office visit was in April 2019 with Dr. Ernst Bowler.   Since the last visit, she is no longer living in [place]. She got a new job after she completed school. She is working at daycare. The babies Glenard Haring and Kyra Searles) are keeping her busy especially since she has not been working with the COVID-19. They will be two in October. Her husband recently left her a couple of months ago. He committed child abuse to one her girls. He is MIA now. Her mother is living with her still and she has a strong social support network.   She was losing her weight but the pandemic has not done much to help this. She reports that this has made her symptoms worse. She did have some back pain issues due to disk protrusion. She was also having some bladder issues. She is not going to pursue  the bariatric surgery since she did not tolerate her c/s very well and is still recovering from this.   She has been having itching and breaking out on her arms. She has bene out of her asthma medications for a week or so. She had these bumps on her arms and hips. They are very itchy.  She is thinking she might need updated skin testing.  She thinks Dr. Ishmael Holter perform skin testing, but otherwise has not had skin testing in over 3 years.  Asthma/Respiratory Symptom History: She remains on the Flovent two puffs twice daily. She does need a spacer. Her mother can pick it up. She has not needed any hospitalizations since the last visit for her asthma at least. ACT is 15 today, indicating poor asthma control. She did run out of her medications.    Allergic Rhinitis Symptom History: She does need a refill of her Claritin. She is not using her nasal spray at all.  She has not needed antibiotics at all.  She is interested in a nasal spray. She is interested in retesting to see what new allergies might have developed. She feels that her symptoms overall have worsened over the years.   Otherwise, there have been no changes to her past medical history, surgical history, family history, or social history.  Assessment and Plan:  Dennie is a 43 y.o. female with:  Mild persistent asthma without complication  Perennial allergic rhinitis  Depression - with good social support network in place  Obesity - with a continuing loss of weight  since her recent pregnancy    1. Mild persistent asthma - New spacer provided today.  - Daily controller medication(s): Flovent 135mcg two puffs twice daily + Singulair 10mg  daily - Rescue medications: ProAir 2 puffs every 4 hours as needed - Changes during respiratory infections or worsening symptoms: increase Flovent 139mcg to 4 puffs twice daily for TWO WEEKS. - Asthma control goals:  * Full participation in all desired activities (may need albuterol before  activity) * Albuterol use two time or less a week on average (not counting use with activity) * Cough interfering with sleep two time or less a month * Oral steroids no more than once a year * No hospitalizations   2. Perennial allergic rhinitis - Continue with nasal saline rinses 1-2 times daily. - Zyrtec (cetirizine) daily as needed.  - Start Flonase two sprays per nostril daily as needed.   3. Rash - Add on triamcinolone 0.5% ointment twice daily as needed for the rash.  4. Return in about 4 months (around 12/06/2018).   Diagnostics: None.  Medication List:  Current Outpatient Medications  Medication Sig Dispense Refill  . albuterol (PROVENTIL HFA;VENTOLIN HFA) 108 (90 Base) MCG/ACT inhaler Inhale 2 puffs into the lungs every 4 (four) hours as needed for wheezing or shortness of breath. 1 Inhaler 1  . buPROPion (WELLBUTRIN XL) 300 MG 24 hr tablet Take 300 mg by mouth every morning.   3  . busPIRone (BUSPAR) 15 MG tablet Take 20 mg by mouth 2 (two) times daily.     . Cholecalciferol (VITAMIN D3) 5000 units CAPS Take 10,000 Units by mouth daily.     . citalopram (CELEXA) 40 MG tablet Take 40 mg by mouth at bedtime.     . fluticasone (FLOVENT HFA) 110 MCG/ACT inhaler Inhale 2 puffs into the lungs 2 (two) times daily. 1 Inhaler 5  . indapamide (LOZOL) 2.5 MG tablet Take 2.5 mg by mouth daily.    Marland Kitchen loratadine (CLARITIN) 10 MG tablet Take 10 mg by mouth daily as needed.     . Methylsulfonylmethane (MSM) 1000 MG CAPS Take by mouth.    . montelukast (SINGULAIR) 10 MG tablet TAKE 1 TABLET(10 MG) BY MOUTH AT BEDTIME (Patient taking differently: Take 10 mg by mouth at bedtime. ) 30 tablet 5  . pantoprazole (PROTONIX) 40 MG tablet Take 1 tablet (40 mg total) by mouth 2 (two) times daily. 60 tablet 2  . Prenatal Vit-Fe Fumarate-FA (PRENATAL MULTIVITAMIN) TABS Take 1 tablet by mouth daily.     Marland Kitchen rOPINIRole (REQUIP) 3 MG tablet Take 3 mg by mouth at bedtime.   0  . Turmeric 500 MG CAPS Take by  mouth.    . vitamin B-12 (CYANOCOBALAMIN) 500 MCG tablet Take 500 mcg by mouth daily.    . vitamin E 400 UNIT capsule Take 400 Units by mouth daily.     No current facility-administered medications for this visit.    Allergies: Allergies  Allergen Reactions  . Bee Venom Shortness Of Breath  . Penicillins Anaphylaxis    Has patient had a PCN reaction causing immediate rash, facial/tongue/throat swelling, SOB or lightheadedness with hypotension: No Has patient had a PCN reaction causing severe rash involving mucus membranes or skin necrosis: No Has patient had a PCN reaction that required hospitalization No Has patient had a PCN reaction occurring within the last 10 years: No If all of the above answers are "NO", then may proceed with Cephalosporin use.'  REACTION: Angioedema  . Adhesive [Tape] Rash  .  Latex Rash  . Vancomycin Other (See Comments)    Pt can tolerate Vancomycin but did cause Red-Man Syndrome.  Recommend to pre-medicate with Benadryl before doses administered.     I reviewed her past medical history, social history, family history, and environmental history and no significant changes have been reported from previous visits.  Review of Systems  Constitutional: Negative for activity change and appetite change.  HENT: Negative for congestion, ear pain, postnasal drip, rhinorrhea, sinus pressure, sinus pain and sore throat.   Eyes: Negative for pain, discharge, redness and itching.  Respiratory: Positive for cough. Negative for shortness of breath, wheezing and stridor.   Gastrointestinal: Negative for constipation, diarrhea, nausea and vomiting.  Musculoskeletal: Negative for arthralgias, joint swelling and myalgias.  Skin: Positive for rash.  Allergic/Immunologic: Negative for environmental allergies and food allergies.    Objective:  Physical exam not obtained as encounter was done via telephone.   Previous notes and tests were reviewed.  I discussed the  assessment and treatment plan with the patient. The patient was provided an opportunity to ask questions and all were answered. The patient agreed with the plan and demonstrated an understanding of the instructions.   The patient was advised to call back or seek an in-person evaluation if the symptoms worsen or if the condition fails to improve as anticipated.  I provided 27 minutes of non-face-to-face time during this encounter.  It was my pleasure to participate in [place] care today. Please feel free to contact me with any questions or concerns.   Sincerely,  Valentina Shaggy, MD

## 2018-08-06 NOTE — Telephone Encounter (Signed)
error 

## 2018-08-06 NOTE — Telephone Encounter (Signed)
Patient is returning your call in regards to her upcoming appt.

## 2018-08-06 NOTE — Patient Instructions (Addendum)
1. Mild persistent asthma - New spacer provided today.  - Daily controller medication(s): Flovent 129mcg two puffs twice daily + Singulair 10mg  daily - Rescue medications: ProAir 2 puffs every 4 hours as needed - Changes during respiratory infections or worsening symptoms: increase Flovent 149mcg to 4 puffs twice daily for TWO WEEKS. - Asthma control goals:  * Full participation in all desired activities (may need albuterol before activity) * Albuterol use two time or less a week on average (not counting use with activity) * Cough interfering with sleep two time or less a month * Oral steroids no more than once a year * No hospitalizations   2. Perennial allergic rhinitis - Continue with nasal saline rinses 1-2 times daily. - Zyrtec (cetirizine) daily as needed.  - Start Flonase two sprays per nostril daily as needed.   3. Rash - Add on triamcinolone 0.5% ointment twice daily as needed for the rash.  4. Return in about 4 months (around 12/06/2018). I can't wait to see the girls again! I bet they have grown up so much!    Please inform us of any Emergency Department visits, hospitalizations, or changes in symptoms. Call us before going to the ED for breathing or allergy symptoms since we might be able to fit you in for a sick visit. Feel free to contact us anytime with any questions, problems, or concerns.  It was a pleasure to see you and your family again today!   Websites that have reliable patient information: 1. American Academy of Asthma, Allergy, and Immunology: www.aaaai.org 2. Food Allergy Research and Education (FARE): foodallergy.org 3. Mothers of Asthmatics: http://www.asthmacommunitynetwork.org 4. American College of Allergy, Asthma, and Immunology: www.acaai.org

## 2018-08-10 ENCOUNTER — Other Ambulatory Visit: Payer: Self-pay

## 2018-08-10 ENCOUNTER — Encounter (HOSPITAL_COMMUNITY): Payer: Self-pay | Admitting: *Deleted

## 2018-08-10 ENCOUNTER — Ambulatory Visit: Payer: Medicaid Other | Admitting: *Deleted

## 2018-08-10 VITALS — Ht 63.0 in | Wt >= 6400 oz

## 2018-08-10 DIAGNOSIS — T884XXA Failed or difficult intubation, initial encounter: Secondary | ICD-10-CM | POA: Insufficient documentation

## 2018-08-10 DIAGNOSIS — K219 Gastro-esophageal reflux disease without esophagitis: Secondary | ICD-10-CM

## 2018-08-10 DIAGNOSIS — R194 Change in bowel habit: Secondary | ICD-10-CM

## 2018-08-10 MED ORDER — NA SULFATE-K SULFATE-MG SULF 17.5-3.13-1.6 GM/177ML PO SOLN: 1.0000 | Freq: Once | ORAL | 0 refills | Status: AC

## 2018-08-10 NOTE — Progress Notes (Signed)
No egg or soy allergy known to patient  No issues with past sedation with any surgeries  or procedures, past  intubation problems per pt with dental surgery- had to be done in the neck per pt- pt has hospital ECL scheduled for 08-13-2018 with SA   No diet pills per patient No home 02 use per patient  No blood thinners per patient  Pt denies issues with constipation  No A fib or A flutter  EMMI video sent to pt's e mail - sent colon and egd    Pt mailed instruction packet to included paper to complete and mail back to [facility] with addressed and stamped envelope, Emmi video. PV completed over the phone. Pt encouraged to call with questions or issues . Pt states prep instructions sent to her  Via My Chart but she has not read them or printed them out. Instructed her to print TODAY as she needs to change her diet and she needs to read these instructions- she stated sh ewill print , read, and call with questions- informed her that I sent her prep to her pharmacy and she needs to pick prep up today or tomorrow- she verbalized understanding

## 2018-08-10 NOTE — Progress Notes (Signed)
SPOKE W/  _with patient via phone     SCREENING SYMPTOMS OF COVID 19:   COUGH--No  RUNNY NOSE--- No  SORE THROAT---No  NASAL CONGESTION----No  SNEEZING----No  SHORTNESS OF BREATH---No  DIFFICULTY BREATHING---No  TEMP >100.0 -----No  UNEXPLAINED BODY ACHES------No  CHILLS -------- No  HEADACHES ---------No  LOSS OF SMELL/ TASTE --------No    HAVE YOU OR ANY FAMILY MEMBER TRAVELLED PAST 14 DAYS OUT OF THE   COUNTY---No STATE----No COUNTRY----No  HAVE YOU OR ANY FAMILY MEMBER BEEN EXPOSED TO ANYONE WITH COVID 19? No  Reviewed with patient about visitor restrictions at present due to Covid 19 with patient verbalizing understanding.

## 2018-08-13 ENCOUNTER — Other Ambulatory Visit: Payer: Self-pay

## 2018-08-13 ENCOUNTER — Ambulatory Visit (HOSPITAL_COMMUNITY): Payer: Medicaid Other | Admitting: Certified Registered"

## 2018-08-13 ENCOUNTER — Encounter (HOSPITAL_COMMUNITY): Payer: Self-pay | Admitting: *Deleted

## 2018-08-13 ENCOUNTER — Ambulatory Visit (HOSPITAL_COMMUNITY)
Admission: RE | Admit: 2018-08-13 | Discharge: 2018-08-13 | Disposition: A | Discharge status: Home / Self Care | Payer: Medicaid Other | Attending: Gastroenterology | Admitting: Gastroenterology

## 2018-08-13 ENCOUNTER — Encounter (HOSPITAL_COMMUNITY)
Admission: RE | Disposition: A | Discharge status: Home / Self Care | Payer: Self-pay | Source: Home / Self Care | Attending: Gastroenterology

## 2018-08-13 DIAGNOSIS — Z79899 Other long term (current) drug therapy: Secondary | ICD-10-CM | POA: Insufficient documentation

## 2018-08-13 DIAGNOSIS — F250 Schizoaffective disorder, bipolar type: Secondary | ICD-10-CM | POA: Diagnosis not present

## 2018-08-13 DIAGNOSIS — J449 Chronic obstructive pulmonary disease, unspecified: Secondary | ICD-10-CM | POA: Diagnosis not present

## 2018-08-13 DIAGNOSIS — K2990 Gastroduodenitis, unspecified, without bleeding: Secondary | ICD-10-CM

## 2018-08-13 DIAGNOSIS — G2581 Restless legs syndrome: Secondary | ICD-10-CM | POA: Insufficient documentation

## 2018-08-13 DIAGNOSIS — K319 Disease of stomach and duodenum, unspecified: Secondary | ICD-10-CM | POA: Insufficient documentation

## 2018-08-13 DIAGNOSIS — K589 Irritable bowel syndrome without diarrhea: Secondary | ICD-10-CM | POA: Insufficient documentation

## 2018-08-13 DIAGNOSIS — F419 Anxiety disorder, unspecified: Secondary | ICD-10-CM | POA: Insufficient documentation

## 2018-08-13 DIAGNOSIS — G4730 Sleep apnea, unspecified: Secondary | ICD-10-CM | POA: Diagnosis not present

## 2018-08-13 DIAGNOSIS — K648 Other hemorrhoids: Secondary | ICD-10-CM | POA: Insufficient documentation

## 2018-08-13 DIAGNOSIS — K3189 Other diseases of stomach and duodenum: Secondary | ICD-10-CM | POA: Diagnosis not present

## 2018-08-13 DIAGNOSIS — Z7951 Long term (current) use of inhaled steroids: Secondary | ICD-10-CM | POA: Diagnosis not present

## 2018-08-13 DIAGNOSIS — K921 Melena: Secondary | ICD-10-CM | POA: Diagnosis present

## 2018-08-13 DIAGNOSIS — R194 Change in bowel habit: Secondary | ICD-10-CM | POA: Insufficient documentation

## 2018-08-13 DIAGNOSIS — E119 Type 2 diabetes mellitus without complications: Secondary | ICD-10-CM | POA: Insufficient documentation

## 2018-08-13 DIAGNOSIS — Z6841 Body Mass Index (BMI) 40.0 and over, adult: Secondary | ICD-10-CM | POA: Diagnosis not present

## 2018-08-13 DIAGNOSIS — E7800 Pure hypercholesterolemia, unspecified: Secondary | ICD-10-CM | POA: Diagnosis not present

## 2018-08-13 DIAGNOSIS — D125 Benign neoplasm of sigmoid colon: Secondary | ICD-10-CM | POA: Diagnosis not present

## 2018-08-13 DIAGNOSIS — F329 Major depressive disorder, single episode, unspecified: Secondary | ICD-10-CM | POA: Insufficient documentation

## 2018-08-13 DIAGNOSIS — K210 Gastro-esophageal reflux disease with esophagitis: Secondary | ICD-10-CM | POA: Diagnosis not present

## 2018-08-13 DIAGNOSIS — Z87891 Personal history of nicotine dependence: Secondary | ICD-10-CM | POA: Insufficient documentation

## 2018-08-13 DIAGNOSIS — I10 Essential (primary) hypertension: Secondary | ICD-10-CM | POA: Diagnosis not present

## 2018-08-13 DIAGNOSIS — M1990 Unspecified osteoarthritis, unspecified site: Secondary | ICD-10-CM | POA: Insufficient documentation

## 2018-08-13 DIAGNOSIS — K2970 Gastritis, unspecified, without bleeding: Secondary | ICD-10-CM

## 2018-08-13 HISTORY — PX: POLYPECTOMY: SHX5525

## 2018-08-13 HISTORY — PX: ESOPHAGOGASTRODUODENOSCOPY (EGD) WITH PROPOFOL: SHX5813

## 2018-08-13 HISTORY — PX: COLONOSCOPY WITH PROPOFOL: SHX5780

## 2018-08-13 HISTORY — PX: BIOPSY: SHX5522

## 2018-08-13 LAB — GLUCOSE, CAPILLARY: Glucose-Capillary: 78 mg/dL (ref 70–99)

## 2018-08-13 SURGERY — ESOPHAGOGASTRODUODENOSCOPY (EGD) WITH PROPOFOL
Anesthesia: General

## 2018-08-13 MED ORDER — DEXAMETHASONE SODIUM PHOSPHATE 10 MG/ML IJ SOLN
INTRAMUSCULAR | Status: DC | PRN
  Administered 2018-08-13: 8 mg via INTRAVENOUS

## 2018-08-13 MED ORDER — SODIUM CHLORIDE 0.9 % IV SOLN: INTRAVENOUS | Status: DC

## 2018-08-13 MED ORDER — LACTATED RINGERS IV SOLN
INTRAVENOUS | Status: DC | PRN
  Administered 2018-08-13 (×2): via INTRAVENOUS

## 2018-08-13 MED ORDER — ROCURONIUM BROMIDE 10 MG/ML (PF) SYRINGE
PREFILLED_SYRINGE | INTRAVENOUS | Status: DC | PRN
  Administered 2018-08-13: 20 mg via INTRAVENOUS

## 2018-08-13 MED ORDER — MIDAZOLAM HCL 5 MG/5ML IJ SOLN
INTRAMUSCULAR | Status: DC | PRN
  Administered 2018-08-13: 2 mg via INTRAVENOUS

## 2018-08-13 MED ORDER — PROPOFOL 10 MG/ML IV BOLUS
INTRAVENOUS | Status: DC | PRN
  Administered 2018-08-13: 200 mg via INTRAVENOUS
  Administered 2018-08-13: 40 mg via INTRAVENOUS

## 2018-08-13 MED ORDER — MIDAZOLAM HCL 2 MG/2ML IJ SOLN
INTRAMUSCULAR | Status: AC
  Filled 2018-08-13: qty 2

## 2018-08-13 MED ORDER — FENTANYL CITRATE (PF) 100 MCG/2ML IJ SOLN
INTRAMUSCULAR | Status: AC
  Filled 2018-08-13: qty 2

## 2018-08-13 MED ORDER — ALBUTEROL SULFATE HFA 108 (90 BASE) MCG/ACT IN AERS
INHALATION_SPRAY | RESPIRATORY_TRACT | Status: DC | PRN
  Administered 2018-08-13 (×2): 2 via RESPIRATORY_TRACT

## 2018-08-13 MED ORDER — ONDANSETRON HCL 4 MG/2ML IJ SOLN
INTRAMUSCULAR | Status: DC | PRN
  Administered 2018-08-13: 4 mg via INTRAVENOUS

## 2018-08-13 MED ORDER — SUGAMMADEX SODIUM 200 MG/2ML IV SOLN
INTRAVENOUS | Status: DC | PRN
  Administered 2018-08-13: 450 mg via INTRAVENOUS

## 2018-08-13 MED ORDER — LIDOCAINE 2% (20 MG/ML) 5 ML SYRINGE
INTRAMUSCULAR | Status: DC | PRN
  Administered 2018-08-13: 80 mg via INTRAVENOUS

## 2018-08-13 MED ORDER — SUCCINYLCHOLINE CHLORIDE 200 MG/10ML IV SOSY
PREFILLED_SYRINGE | INTRAVENOUS | Status: DC | PRN
  Administered 2018-08-13: 200 mg via INTRAVENOUS

## 2018-08-13 MED ORDER — FENTANYL CITRATE (PF) 100 MCG/2ML IJ SOLN
INTRAMUSCULAR | Status: DC | PRN
  Administered 2018-08-13 (×2): 50 ug via INTRAVENOUS

## 2018-08-13 MED ORDER — EPHEDRINE SULFATE-NACL 50-0.9 MG/10ML-% IV SOSY
PREFILLED_SYRINGE | INTRAVENOUS | Status: DC | PRN
  Administered 2018-08-13 (×2): 5 mg via INTRAVENOUS

## 2018-08-13 MED ORDER — PHENYLEPHRINE 40 MCG/ML (10ML) SYRINGE FOR IV PUSH (FOR BLOOD PRESSURE SUPPORT)
PREFILLED_SYRINGE | INTRAVENOUS | Status: DC | PRN
  Administered 2018-08-13: 120 ug via INTRAVENOUS
  Administered 2018-08-13: 80 ug via INTRAVENOUS

## 2018-08-13 SURGICAL SUPPLY — 24 items

## 2018-08-13 NOTE — Anesthesia Preprocedure Evaluation (Signed)
Anesthesia Evaluation  Patient identified by MRN, date of birth, ID band Patient awake    Reviewed: Allergy & Precautions, H&P , NPO status , Patient's Chart, lab work & pertinent test results, reviewed documented beta blocker date and time   Airway Mallampati: II  TM Distance: >3 FB Neck ROM: Full    Dental no notable dental hx. (+) Poor Dentition, Dental Advisory Given   Pulmonary shortness of breath and with exertion, asthma , sleep apnea , COPD, former smoker,    Pulmonary exam normal breath sounds clear to auscultation       Cardiovascular hypertension, Pt. on medications Normal cardiovascular exam(-) dysrhythmias  Rhythm:Regular Rate:Normal     Neuro/Psych PSYCHIATRIC DISORDERS Bipolar Disorder negative neurological ROS  negative psych ROS   GI/Hepatic negative GI ROS, Neg liver ROS, GERD (denies GERD this AM)  ,  Endo/Other  diabetesMorbid obesity  Renal/GU negative Renal ROS  negative genitourinary   Musculoskeletal negative musculoskeletal ROS (+)   Abdominal (+) + obese,   Peds negative pediatric ROS (+)  Hematology negative hematology ROS (+)   Anesthesia Other Findings   Reproductive/Obstetrics negative OB ROS                             Anesthesia Physical  Anesthesia Plan  ASA: IV  Anesthesia Plan: General   Post-op Pain Management:    Induction: Intravenous and Rapid sequence  PONV Risk Score and Plan: 2 and Treatment may vary due to age or medical condition  Airway Management Planned: Video Laryngoscope Planned and Oral ETT  Additional Equipment:   Intra-op Plan:   Post-operative Plan: Extubation in OR  Informed Consent: I have reviewed the patients History and Physical, chart, labs and discussed the procedure including the risks, benefits and alternatives for the proposed anesthesia with the patient or authorized representative who has indicated his/her  understanding and acceptance.     Dental advisory given  Plan Discussed with: CRNA  Anesthesia Plan Comments:         Anesthesia Quick Evaluation

## 2018-08-13 NOTE — Op Note (Signed)
[facility] Patient Name: Heather [name] Procedure Date: 08/13/2018 MRN: [id number] Attending MD: Carlota Raspberry. Havery Moros , MD Date of Birth: 01-01-1976 CSN: [id number] Age: 43 Admit Type: Outpatient Procedure:                Colonoscopy Indications:              reported gastrointestinal bleeding, Change in bowel                            habits - stools concerning for melena, also with                            scant hematochezia, Hgb normal Providers:                Carlota Raspberry. Havery Moros, MD, Cleda Daub, RN, Tinnie Gens, Technician Referring MD:              Medicines:                Monitored Anesthesia Care Complications:            No immediate complications. Estimated blood loss:                            Minimal. Estimated Blood Loss:     Estimated blood loss was minimal. Procedure:                Pre-Anesthesia Assessment:                           - Prior to the procedure, a History and Physical                            was performed, and patient medications and                            allergies were reviewed. The patient's tolerance of                            previous anesthesia was also reviewed. The risks                            and benefits of the procedure and the sedation                            options and risks were discussed with the patient.                            All questions were answered, and informed consent                            was obtained. Prior Anticoagulants: The patient has                            taken no  previous anticoagulant or antiplatelet                            agents. ASA Grade Assessment: III - A patient with                            severe systemic disease. After reviewing the risks                            and benefits, the patient was deemed in                            satisfactory condition to undergo the procedure.                           After obtaining  informed consent, the colonoscope                            was passed under direct vision. Throughout the                            procedure, the patient's blood pressure, pulse, and                            oxygen saturations were monitored continuously. The                            CF-HQ190L ([id number]) Olympus colonoscope was                            introduced through the anus and advanced to the the                            terminal ileum, with identification of the                            appendiceal orifice and IC valve. The colonoscopy                            was performed without difficulty. The patient                            tolerated the procedure well. The quality of the                            bowel preparation was adequate. The terminal ileum,                            ileocecal valve, appendiceal orifice, and rectum                            were photographed. Scope In: 1:25:50 PM Scope Out: 1:42:42 PM Scope Withdrawal Time: 0 hours 12 minutes 46 seconds  Total Procedure Duration: 0 hours 16 minutes 52  seconds  Findings:      The perianal and digital rectal examinations were normal.      The terminal ileum appeared normal.      A 4 mm polyp was found in the sigmoid colon. The polyp was sessile. The       polyp was removed with a cold snare. Resection and retrieval were       complete.      Internal hemorrhoids were found during retroflexion.      Brown stool was noted throughout the colon, no melena or old blood. The       exam was otherwise without abnormality. Impression:               - The examined portion of the ileum was normal.                           - One 4 mm polyp in the sigmoid colon, removed with                            a cold snare. Resected and retrieved.                           - Internal hemorrhoids.                           - The examination was otherwise normal.                           Suspect hemorrhoids may be cause  of scant red blood                            she is seeing, no pathology otherwise noted to                            account for symptoms on EGD and colonoscopy. With                            normal Hgb, unclear if dietary ingestion is making                            her stool black. Moderate Sedation:      No moderate sedation, case performed with MAC Recommendation:           - Patient has a contact number available for                            emergencies. The signs and symptoms of potential                            delayed complications were discussed with the                            patient. Return to normal activities tomorrow.                            Written discharge instructions were  provided to the                            patient.                           - Resume previous diet.                           - Continue present medications.                           - Await pathology results.                           - Would test stools for occult blood. If symptoms                            persist and stool testing is positive,                            consideration for capsule endoscopy Procedure Code(s):        --- Professional ---                           [phone], Colonoscopy, flexible; with removal of                            tumor(s), polyp(s), or other lesion(s) by snare                            technique Diagnosis Code(s):        --- Professional ---                           K64.8, Other hemorrhoids                           K63.5, Polyp of colon                           K92.2, Gastrointestinal hemorrhage, unspecified                           R19.4, Change in bowel habit CPT copyright 2019 American Medical Association. All rights reserved. The codes documented in this report are preliminary and upon coder review may  be revised to meet current compliance requirements. Remo Lipps P. Armbruster, MD 08/13/2018 1:50:36 PM This report has been signed  electronically. Number of Addenda: 0

## 2018-08-13 NOTE — Discharge Instructions (Signed)
YOU HAD AN ENDOSCOPIC PROCEDURE TODAY: Refer to the procedure report and other information in the discharge instructions given to you for any specific questions about what was found during the examination. If this information does not answer your questions, please call [place] office at [phone] to clarify.  ° °YOU SHOULD EXPECT: Some feelings of bloating in the abdomen. Passage of more gas than usual. Walking can help get rid of the air that was put into your GI tract during the procedure and reduce the bloating. If you had a lower endoscopy (such as a colonoscopy or flexible sigmoidoscopy) you may notice spotting of blood in your stool or on the toilet paper. Some abdominal soreness may be present for a day or two, also. ° °DIET: Your first meal following the procedure should be a light meal and then it is ok to progress to your normal diet. A half-sandwich or bowl of soup is an example of a good first meal. Heavy or fried foods are harder to digest and may make you feel nauseous or bloated. Drink plenty of fluids but you should avoid alcoholic beverages for 24 hours. If you had a esophageal dilation, please see attached instructions for diet.   ° °ACTIVITY: Your care partner should take you home directly after the procedure. You should plan to take it easy, moving slowly for the rest of the day. You can resume normal activity the day after the procedure however YOU SHOULD NOT DRIVE, use power tools, machinery or perform tasks that involve climbing or major physical exertion for 24 hours (because of the sedation medicines used during the test).  ° °SYMPTOMS TO REPORT IMMEDIATELY: °A gastroenterologist can be reached at any hour. Please call [phone]  for any of the following symptoms:  °Following lower endoscopy (colonoscopy, flexible sigmoidoscopy) °Excessive amounts of blood in the stool  °Significant tenderness, worsening of abdominal pains  °Swelling of the abdomen that is new, acute  °Fever of 100° or  higher  °Following upper endoscopy (EGD, EUS, ERCP, esophageal dilation) °Vomiting of blood or coffee ground material  °New, significant abdominal pain  °New, significant chest pain or pain under the shoulder blades  °Painful or persistently difficult swallowing  °New shortness of breath  °Black, tarry-looking or red, bloody stools ° °FOLLOW UP:  °If any biopsies were taken you will be contacted by phone or by letter within the next 1-3 weeks. Call [phone]  if you have not heard about the biopsies in 3 weeks.  °Please also call with any specific questions about appointments or follow up tests. ° °

## 2018-08-13 NOTE — Anesthesia Procedure Notes (Addendum)
Procedure Name: Intubation Date/Time: 08/13/2018 1:01 PM Performed by: Silas Sacramento, CRNA Pre-anesthesia Checklist: Patient identified, Emergency Drugs available, Suction available and Patient being monitored Patient Re-evaluated:Patient Re-evaluated prior to induction Oxygen Delivery Method: Circle system utilized Preoxygenation: Pre-oxygenation with 100% oxygen Induction Type: IV induction and Rapid sequence Laryngoscope Size: 4 and Glidescope Grade View: Grade I Tube type: Oral Tube size: 7.0 mm Number of attempts: 1 Airway Equipment and Method: Stylet and Oral airway Placement Confirmation: ETT inserted through vocal cords under direct vision,  positive ETCO2 and breath sounds checked- equal and bilateral Secured at: 23 cm Tube secured with: Tape Dental Injury: Teeth and Oropharynx as per pre-operative assessment

## 2018-08-13 NOTE — Anesthesia Postprocedure Evaluation (Signed)
Anesthesia Post Note  Patient: Donae Kueker Morrissette  Procedure(s) Performed: ESOPHAGOGASTRODUODENOSCOPY (EGD) WITH PROPOFOL (N/A ) COLONOSCOPY WITH PROPOFOL (N/A ) BIOPSY POLYPECTOMY     Patient location during evaluation: Endoscopy Anesthesia Type: General Level of consciousness: awake and alert Pain management: pain level controlled Vital Signs Assessment: post-procedure vital signs reviewed and stable Respiratory status: spontaneous breathing, nonlabored ventilation, respiratory function stable and patient connected to nasal cannula oxygen Cardiovascular status: blood pressure returned to baseline and stable Postop Assessment: no apparent nausea or vomiting Anesthetic complications: no    Last Vitals:  Vitals:   08/13/18 1400 08/13/18 1415  BP: (!) 149/68 (!) 165/99  Pulse: 88 83  Resp: 16 (!) 21  Temp:    SpO2: 97% 96%    Last Pain:  Vitals:   08/13/18 1415  TempSrc:   PainSc: 0-No pain                 Montez Hageman

## 2018-08-13 NOTE — Interval H&P Note (Signed)
History and Physical Interval Note:  08/13/2018 11:56 AM  Heather [name]  has presented today for surgery, with the diagnosis of Change in bowel habits/Morbid obesity/GERD/EoE.  The various methods of treatment have been discussed with the patient and family. After consideration of risks, benefits and other options for treatment, the patient has consented to  Procedure(s): ESOPHAGOGASTRODUODENOSCOPY (EGD) WITH PROPOFOL (N/A) COLONOSCOPY WITH PROPOFOL (N/A) as a surgical intervention.  The patient's history has been reviewed, patient examined, no change in status, stable for surgery.  I have reviewed the patient's chart and labs.  Questions were answered to the patient's satisfaction.     [place]

## 2018-08-13 NOTE — Transfer of Care (Signed)
Immediate Anesthesia Transfer of Care Note  Patient: Heather [name]  Procedure(s) Performed: ESOPHAGOGASTRODUODENOSCOPY (EGD) WITH PROPOFOL (N/A ) COLONOSCOPY WITH PROPOFOL (N/A ) BIOPSY POLYPECTOMY  Patient Location: PACU  Anesthesia Type:General  Level of Consciousness: awake, patient cooperative and responds to stimulation  Airway & Oxygen Therapy: Patient Spontanous Breathing and Patient connected to face mask oxygen  Post-op Assessment: Report given to RN and Post -op Vital signs reviewed and stable  Post vital signs: Reviewed and stable  Last Vitals:  Vitals Value Taken Time  BP 128/93 08/13/2018  1:56 PM  Temp 37.1 C 08/13/2018  1:56 PM  Pulse 85 08/13/2018  1:57 PM  Resp 15 08/13/2018  1:57 PM  SpO2 98 % 08/13/2018  1:57 PM  Vitals shown include unvalidated device data.  Last Pain:  Vitals:   08/13/18 1356  TempSrc: Temporal  PainSc: 0-No pain         Complications: No apparent anesthesia complications

## 2018-08-13 NOTE — Op Note (Signed)
[facility] Patient Name: Heather [name] Procedure Date: 08/13/2018 MRN: [id number] Attending MD: Carlota Raspberry. Havery Moros , MD Date of Birth: May 18, 1975 CSN: [id number] Age: 43 Admit Type: Outpatient Procedure:                Upper GI endoscopy Indications:              suspected gastrointestinal bleeding of unknown                            origin - dark black stools periodically along with                            scant hematochezia, Hgb normal Providers:                Carlota Raspberry. Havery Moros, MD, Cleda Daub, RN, Tinnie Gens, Technician Referring MD:              Medicines:                Monitored Anesthesia Care Complications:            No immediate complications. Estimated blood loss:                            Minimal. Estimated Blood Loss:     Estimated blood loss was minimal. Procedure:                Pre-Anesthesia Assessment:                           - Prior to the procedure, a History and Physical                            was performed, and patient medications and                            allergies were reviewed. The patient's tolerance of                            previous anesthesia was also reviewed. The risks                            and benefits of the procedure and the sedation                            options and risks were discussed with the patient.                            All questions were answered, and informed consent                            was obtained. Prior Anticoagulants: The patient has                            taken  no previous anticoagulant or antiplatelet                            agents. ASA Grade Assessment: III - A patient with                            severe systemic disease. After reviewing the risks                            and benefits, the patient was deemed in                            satisfactory condition to undergo the procedure.                           After obtaining  informed consent, the endoscope was                            passed under direct vision. Throughout the                            procedure, the patient's blood pressure, pulse, and                            oxygen saturations were monitored continuously. The                            GIF-H190 ([id number]) Olympus gastroscope was                            introduced through the mouth, and advanced to the                            second part of duodenum. The upper GI endoscopy was                            accomplished without difficulty. The patient                            tolerated the procedure well. Scope In: Scope Out: Findings:      Esophagogastric landmarks were identified: the Z-line was found at 39       cm, the gastroesophageal junction was found at 39 cm and the upper       extent of the gastric folds was found at 39 cm from the incisors.      The exam of the esophagus was otherwise normal.      Patchy mucosal changes characterized by discoloration / hypopigmented       areas without ulceration were found in the gastric fundus and in the       gastric bodym, could be normal variant but biopsies were taken with a       cold forceps for histology.      The exam of the stomach was otherwise normal.      The duodenal bulb and second portion of the duodenum were tortous  but       otherwise normal. Impression:               - Esophagogastric landmarks identified.                           - Normal esophagus                           - Patchy hypopigmented mucosa without ulceration in                            the gastric fundus and gastric body, could be                            normal variant but biopsied.                           - Normal duodenal bulb and second portion of the                            duodenum.                           No cause for GI bleeding on this exam or                            colonoscopy. Unclear if patient is having change in                             stool color due to ingestion or truly having blood                            loss. Hgb normal Moderate Sedation:      No moderate sedation, case performed with MAC Recommendation:           - Patient has a contact number available for                            emergencies. The signs and symptoms of potential                            delayed complications were discussed with the                            patient. Return to normal activities tomorrow.                            Written discharge instructions were provided to the                            patient.                           - Resume previous diet.                           -  Continue present medications.                           - Await pathology results.                           - Would send stools for occult blood in the setting                            of symptoms, if positive, would pursue capsule                            endoscopy Procedure Code(s):        --- Professional ---                           [id number], Esophagogastroduodenoscopy, flexible,                            transoral; with biopsy, single or multiple Diagnosis Code(s):        --- Professional ---                           K31.89, Other diseases of stomach and duodenum                           K92.2, Gastrointestinal hemorrhage, unspecified CPT copyright 2019 American Medical Association. All rights reserved. The codes documented in this report are preliminary and upon coder review may  be revised to meet current compliance requirements. Remo Lipps P. Shuan Statzer, MD 08/13/2018 1:57:36 PM This report has been signed electronically. Number of Addenda: 0

## 2018-08-14 ENCOUNTER — Encounter (HOSPITAL_COMMUNITY): Payer: Self-pay | Admitting: Gastroenterology

## 2018-08-20 ENCOUNTER — Other Ambulatory Visit: Payer: Self-pay

## 2018-08-20 DIAGNOSIS — R195 Other fecal abnormalities: Secondary | ICD-10-CM

## 2018-08-20 NOTE — Progress Notes (Signed)
Order entered for Hemoccult blood cards.  Pt can come to our office to get cards if she decides to be tested.

## 2018-09-17 ENCOUNTER — Other Ambulatory Visit: Payer: Self-pay

## 2018-09-17 ENCOUNTER — Ambulatory Visit (INDEPENDENT_AMBULATORY_CARE_PROVIDER_SITE_OTHER): Payer: Medicaid Other | Admitting: Allergy & Immunology

## 2018-09-17 ENCOUNTER — Encounter: Payer: Self-pay | Admitting: Allergy & Immunology

## 2018-09-17 VITALS — BP 104/80 | HR 107 | Temp 99.8°F

## 2018-09-17 DIAGNOSIS — J4540 Moderate persistent asthma, uncomplicated: Secondary | ICD-10-CM

## 2018-09-17 DIAGNOSIS — B86 Scabies: Secondary | ICD-10-CM | POA: Diagnosis not present

## 2018-09-17 DIAGNOSIS — J3089 Other allergic rhinitis: Secondary | ICD-10-CM

## 2018-09-17 MED ORDER — IVERMECTIN 3 MG PO TABS: 200.0000 ug/kg | ORAL_TABLET | Freq: Once | ORAL | 0 refills | Status: AC

## 2018-09-17 MED ORDER — BUDESONIDE-FORMOTEROL FUMARATE 160-4.5 MCG/ACT IN AERO: 2.0000 | INHALATION_SPRAY | Freq: Two times a day (BID) | RESPIRATORY_TRACT | 5 refills | Status: DC

## 2018-09-17 MED ORDER — PERMETHRIN 5 % EX CREA: 1.0000 "application " | TOPICAL_CREAM | Freq: Once | CUTANEOUS | 0 refills | Status: AC

## 2018-09-17 NOTE — Progress Notes (Signed)
FOLLOW UP  Date of Service/Encounter:  09/17/18   Assessment:   Moderate persistent asthma without complication  Perennial allergic rhinitis  Rash - ? scabies  Depression - with good social support network in place  Obesity - with a continuing loss of weight since her recent pregnancy  Plan/Recommendations:   1.  Moderate persistent asthma - Since you are needing your rescue inhaler so often, we are going to change to Symbicort instead of Flovent.  - Daily controller medication(s): Symbicort 160/4.5 two puffs twice daily + Singulair 10mg  daily - Rescue medications: ProAir 2 puffs every 4 hours as needed  - Changes during respiratory infections or worsening symptoms: add on Flovent 128mcg to 4 puffs twice daily for TWO WEEKS. - Asthma control goals:  * Full participation in all desired activities (may need albuterol before activity) * Albuterol use two time or less a week on average (not counting use with activity) * Cough interfering with sleep two time or less a month * Oral steroids no more than once a year * No hospitalizations   2. Perennial allergic rhinitis - Continue with nasal saline rinses 1-2 times daily. - Zyrtec (cetirizine) daily as needed.  - Continue with Flonase two sprays per nostril daily as needed.   3. Rash - ? scabies - Add on perimetric topical cream 5%: thoroughly massage cream from head to soles of feet; leave on for 8 to 14 hours before removing (shower or bath) - Repeat nightly for one week.  - Take ivermectin once.  - Give [place] an update next week.    4. Return in about 4 weeks (around 10/15/2018).   Subjective:   Heather [name] is a 43 y.o. female presenting today for follow up of  Chief Complaint  Patient presents with  . Urticaria  . Asthma    Heather [name] has a history of the following: Patient Active Problem List   Diagnosis Date Noted  . Blood in stool   . Gastritis and gastroduodenitis   . Benign neoplasm of  sigmoid colon   . Difficult intubation 08/10/2018  . Class 3 severe obesity with serious comorbidity and body mass index (BMI) greater than or equal to 70 in adult (Heather [name]) 06/16/2018  . Nexplanon insertion 03/27/2017  . Postpartum hypertension 03/05/2017  . Status post primary low transverse cesarean section 02/03/2017  . H/O pre-eclampsia 01/27/2017  . Gestational htn w/o significant proteinuria, third trimester 01/20/2017  . Mild persistent asthma without complication 35/32/9924  . Perennial allergic rhinitis 11/05/2016  . Mild persistent asthma with acute exacerbation 11/05/2016  . Excess weight gain in pregnancy, second trimester 10/30/2016  . Anxiety and depression 11/07/2015  . Hard to intubate 11/07/2015  . Hypoglycemia 11/07/2015  . OSA (obstructive sleep apnea) 11/07/2015  . DM type 2 (diabetes mellitus, type 2) ([place]) 12/07/2014  . Panniculitis 12/06/2014  . Cellulitis, abdominal wall 11/11/2014  . Abdominal pain 07/14/2014  . Loose stools 07/14/2014  . Melena 01/27/2014  . Eosinophilic esophagitis 26/83/4196  . Change in bowel habits 04/28/2013  . Esophageal dysphagia 04/28/2013  . Insomnia due to mental disorder(327.02) 08/08/2011  . RLS (restless legs syndrome) 08/08/2011  . Adjustment disorder with depressed mood 08/06/2011  . Schizoaffective disorder, bipolar type ([place]) 08/02/2011    Class: Acute  . PALPITATIONS, OCCASIONAL 11/01/2009  . Bipolar disorder ([place]) 10/19/2008  . DISORDER, TOBACCO USE 08/25/2008  . Leukocytosis 07/28/2008  . ALLERGIC RHINITIS, SEASONAL 08/20/2007  . DYSMETABOLIC SYNDROME 22/29/7989  . Morbid obesity ([place])  05/13/2006  . EXTERNAL HEMORRHOIDS 05/13/2006  . HYPERLIPIDEMIA 05/12/2006  . Essential hypertension 05/12/2006  . ASTHMA 05/12/2006  . GERD (gastroesophageal reflux disease) 05/12/2006  . OSTEOARTHRITIS 05/12/2006     History obtained from: chart review and patient.  Heather [name] is a 43 y.o. female presenting for a follow up visit.   She was last seen in April 2020 for a telephone visit.  At that time, we provided her with a new spacer.  We continued the Flovent 110 mcg 2 puffs twice daily and Singulair 10 mg daily.  We recommended that she increase her Flovent to 4 puffs twice daily during respiratory flares.  For her allergic rhinitis, we continued cetirizine as well as fluticasone 2 sprays per nostril daily.  She was endorsing a rash at that time and I recommended adding on triamcinolone 0.5% ointment twice daily as needed.  This was a telephone visit, so I did not get a good picture of the rash.  Since last visit, she has remained somewhat stable.  She is complaining today about this rash.  We gave triamcinolone at the last visit, but it does not seem to have helped.  Evidently, her kids were both treated for scabies.  She did try using the remaining permethrin on herself, but it did not cover much of her body.  Her husband left them around 3 months ago, and he had a similar appearing rash when he was still living at their house.  She is unsure what he was eventually diagnosed with.  Her mother lives with them at their house and she has not had any rash.  Asthma/Respiratory Symptom History: She reports that she has been more sedentary due to a job loss. She has been unable to go out of the house. She has been sittng around for so long. She reports that she is using her rescue inhaler twce daily. She is using the Flovent two puffs BID with a spacer. ACT score 15, indicating poor asthma control.  She has been coughing a lot more at night.  She is using her albuterol inhaler fairly often.  There are no new exposures.  Allergic Rhinitis Symptom History: She remains on fluticasone 2 sprays per nostril daily.  She is also on cetirizine daily.  She has not required any antibiotics at all since last visit.  This is a fairly terrible time a year for symptoms.  Otherwise, there have been no changes to her past medical history, surgical  history, family history, or social history.    Review of Systems  Constitutional: Negative.  Negative for chills, fever, malaise/fatigue and weight loss.  HENT: Negative.  Negative for congestion, ear discharge, ear pain, sinus pain and sore throat.   Eyes: Negative for pain, discharge and redness.  Respiratory: Positive for cough, shortness of breath and wheezing. Negative for sputum production.   Cardiovascular: Negative.  Negative for chest pain and palpitations.  Gastrointestinal: Negative for abdominal pain, heartburn, nausea and vomiting.  Skin: Negative.  Negative for itching and rash.  Neurological: Negative for dizziness and headaches.  Endo/Heme/Allergies: Negative for environmental allergies. Does not bruise/bleed easily.       Objective:   Blood pressure 104/80, pulse (!) 107, temperature 99.8 F (37.7 C), temperature source Tympanic, SpO2 97 %, not currently breastfeeding. There is no height or weight on file to calculate BMI.   Physical Exam:  Physical Exam  Constitutional: She appears well-developed.  Morbidly obese female.  HENT:  Head: Normocephalic and atraumatic.  Right Ear: Tympanic  membrane, external ear and ear canal normal.  Left Ear: Tympanic membrane, external ear and ear canal normal.  Nose: Mucosal edema and rhinorrhea present. No nasal deformity or septal deviation. No epistaxis. Right sinus exhibits no maxillary sinus tenderness and no frontal sinus tenderness. Left sinus exhibits no maxillary sinus tenderness and no frontal sinus tenderness.  Mouth/Throat: Uvula is midline and oropharynx is clear and moist. Mucous membranes are not pale and not dry.  Eyes: Pupils are equal, round, and reactive to light. Conjunctivae and EOM are normal. Right eye exhibits no chemosis and no discharge. Left eye exhibits no chemosis and no discharge. Right conjunctiva is not injected. Left conjunctiva is not injected.  Cardiovascular: Normal rate, regular rhythm and  normal heart sounds.  Respiratory: Effort normal and breath sounds normal. No accessory muscle usage. No tachypnea. No respiratory distress. She has no wheezes. She has no rhonchi. She has no rales. She exhibits no tenderness.  Moving air well in all lung fields.  Lymphadenopathy:    She has no cervical adenopathy.  Neurological: She is alert.  Skin: Lesion and rash noted. No abrasion, no ecchymosis and no petechiae noted. Rash is pustular. Rash is not papular, not vesicular and not urticarial. No erythema. No pallor.  There are pustular lesions over the arms as well as thighs and abdomen.  There are excoriations present.  They do appear consistent with scabies.  Psychiatric: She has a normal mood and affect.     Diagnostic studies: none        Salvatore Marvel, MD  [facility]

## 2018-09-17 NOTE — Patient Instructions (Addendum)
1. Mild persistent asthma - Since you are needing your rescue inhaler so often, we are going to change to Symbicort instead of Flovent.  - Daily controller medication(s): Symbicort 160/4.5 two puffs twice daily + Singulair 10mg  daily - Rescue medications: ProAir 2 puffs every 4 hours as needed  - Changes during respiratory infections or worsening symptoms: add on Flovent 140mcg to 4 puffs twice daily for TWO WEEKS. - Asthma control goals:  * Full participation in all desired activities (may need albuterol before activity) * Albuterol use two time or less a week on average (not counting use with activity) * Cough interfering with sleep two time or less a month * Oral steroids no more than once a year * No hospitalizations   2. Perennial allergic rhinitis - Continue with nasal saline rinses 1-2 times daily. - Zyrtec (cetirizine) daily as needed.  - Continue with Flonase two sprays per nostril daily as needed.   3. Rash - ? scabies - Add on perimetric topical cream 5%: thoroughly massage cream from head to soles of feet; leave on for 8 to 14 hours before removing (shower or bath) - Repeat nightly for one week.  - Take ivermectin once.  - Give [place] an update next week.    4. Return in about 4 weeks (around 10/15/2018).    Please inform us of any Emergency Department visits, hospitalizations, or changes in symptoms. Call us before going to the ED for breathing or allergy symptoms since we might be able to fit you in for a sick visit. Feel free to contact us anytime with any questions, problems, or concerns.  It was a pleasure to see you and your family again today!   Websites that have reliable patient information: 1. American Academy of Asthma, Allergy, and Immunology: www.aaaai.org 2. Food Allergy Research and Education (FARE): foodallergy.org 3. Mothers of Asthmatics: http://www.asthmacommunitynetwork.org 4. American College of Allergy, Asthma, and Immunology: www.acaai.org

## 2018-09-24 ENCOUNTER — Ambulatory Visit (INDEPENDENT_AMBULATORY_CARE_PROVIDER_SITE_OTHER): Payer: Medicaid Other | Admitting: Bariatrics

## 2018-09-24 ENCOUNTER — Other Ambulatory Visit (INDEPENDENT_AMBULATORY_CARE_PROVIDER_SITE_OTHER): Payer: Self-pay | Admitting: Bariatrics

## 2018-09-24 ENCOUNTER — Other Ambulatory Visit: Payer: Self-pay

## 2018-09-24 ENCOUNTER — Encounter (INDEPENDENT_AMBULATORY_CARE_PROVIDER_SITE_OTHER): Payer: Self-pay | Admitting: Bariatrics

## 2018-09-24 VITALS — BP 136/75 | HR 89 | Temp 98.7°F | Ht 63.0 in | Wt >= 6400 oz

## 2018-09-24 DIAGNOSIS — I10 Essential (primary) hypertension: Secondary | ICD-10-CM

## 2018-09-24 DIAGNOSIS — Z9189 Other specified personal risk factors, not elsewhere classified: Secondary | ICD-10-CM

## 2018-09-24 DIAGNOSIS — E559 Vitamin D deficiency, unspecified: Secondary | ICD-10-CM | POA: Diagnosis not present

## 2018-09-24 DIAGNOSIS — E8881 Metabolic syndrome: Secondary | ICD-10-CM

## 2018-09-24 DIAGNOSIS — E88819 Insulin resistance, unspecified: Secondary | ICD-10-CM

## 2018-09-24 DIAGNOSIS — Z6841 Body Mass Index (BMI) 40.0 and over, adult: Secondary | ICD-10-CM

## 2018-09-24 DIAGNOSIS — G2581 Restless legs syndrome: Secondary | ICD-10-CM

## 2018-09-24 DIAGNOSIS — G4733 Obstructive sleep apnea (adult) (pediatric): Secondary | ICD-10-CM

## 2018-09-24 MED ORDER — INDAPAMIDE 2.5 MG PO TABS: 2.5000 mg | ORAL_TABLET | Freq: Every day | ORAL | 0 refills | Status: DC

## 2018-09-24 MED ORDER — ROPINIROLE HCL 3 MG PO TABS: 3.0000 mg | ORAL_TABLET | Freq: Every day | ORAL | 0 refills | Status: DC

## 2018-09-25 LAB — HEMOGLOBIN A1C
Est. average glucose Bld gHb Est-mCnc: 105 mg/dL
Hgb A1c MFr Bld: 5.3 % (ref 4.8–5.6)

## 2018-09-25 LAB — VITAMIN D 25 HYDROXY (VIT D DEFICIENCY, FRACTURES): Vit D, 25-Hydroxy: 33.1 ng/mL (ref 30.0–100.0)

## 2018-09-28 NOTE — Progress Notes (Signed)
Office: [phone]  /  Fax: [phone]   HPI:   Chief Complaint: OBESITY Heather [name] is here to discuss her progress with her obesity treatment plan. She is on the Category 4 plan +200 calories and is following her eating plan approximately 0 % of the time. She states she is exercising 0 minutes 0 times per week. Heather [name] has gained weight since her last visit (06/11/18). Heather [name] lost her job, and she is separated from her spouse; and she is planning on divorce. She has used "food for comfort". Her weight is (!) 450 lb (204.1 kg) today and has had a weight gain of 25 pounds over a period of 15 weeks since her last visit. She has gained 25 lbs since starting treatment with [place].  [name] is a 43 y.o. female with hypertension. [name] denies congestive heart failure. She is working weight loss to help control her blood pressure with the goal of decreasing her risk of heart attack and stroke. [name] blood pressure is currently controlled at 136/75.  OSA (obstructive sleep apnea) [name] has a diagnosis of obstructive sleep apnea. She is not on CPAP at this time. [name] admits to fatigue.  Vitamin D deficiency Heather [name] has a diagnosis of vitamin D deficiency. She is currently taking OTC vit D and denies nausea, vomiting or muscle weakness.  Insulin Resistance [name] has a diagnosis of insulin resistance based on her elevated fasting insulin level >5. Although Heather [name]'s blood glucose readings are still under good control, insulin resistance puts her at greater risk of metabolic syndrome and diabetes. Her last A1c was at 5.5 and last insulin level was at 36.0 She is not taking medications currently and she continues to work on diet and exercise to decrease risk of diabetes.  At risk for diabetes Heather [name] is at higher than average risk for developing diabetes due to her obesity and insulin resistance. She currently denies polyuria or polydipsia.  RLS (restless  legs syndrome) Heather [name] has a diagnosis of restless legs syndrome and she reports increased movements.  ASSESSMENT AND PLAN:  Essential hypertension - Plan: indapamide (LOZOL) 2.5 MG tablet  OSA (obstructive sleep apnea)  Vitamin D deficiency - Plan: VITAMIN D 25 Hydroxy (Vit-D Deficiency, Fractures)  Insulin resistance - Plan: Hemoglobin A1c  RLS (restless legs syndrome) - Plan: rOPINIRole (REQUIP) 3 MG tablet  At risk for diabetes mellitus  Class 3 severe obesity with serious comorbidity and body mass index (BMI) greater than or equal to 70 in adult, unspecified obesity type (HCC)  PLAN:  Hypertension We discussed sodium restriction, rinsing of canned vegetables, working on healthy weight loss, and a regular exercise program as the means to achieve improved blood pressure control. [name] agreed with this plan and agreed to follow up as directed. We will continue to monitor her blood pressure as well as her progress with the above lifestyle modifications. She agrees to take Indapamide 2.5 mg once daily #30 with no refills and will watch for signs of hypotension as she continues her lifestyle modifications.  OSA (obstructive sleep apnea) [name] will recheck obstructive sleep apnea and get new CPAP.  Vitamin D Deficiency Heather [name] was informed that low vitamin D levels contributes to fatigue and are associated with obesity, breast, and colon cancer. She will continue to take OTC vitamin D and will follow up for routine testing of vitamin D, at least 2-3 times per year. She was informed of the risk of over-replacement of vitamin D and agrees to not increase her  dose unless she discusses this with [place] first. We will check vitamin D level and Heather [name] will follow up as directed.  Insulin Resistance Heather [name] will continue to work on weight loss, exercise, and decreasing simple carbohydrates in her diet to help decrease the risk of diabetes. She was informed that eating too many simple  carbohydrates or too many calories at one sitting increases the likelihood of GI side effects. We will check Hgb A1c and insulin level and Heather [name] agreed to follow up with [place] as directed to monitor her progress.  Diabetes risk counseling [name] was given extended (15 minutes) diabetes prevention counseling today. She is 43 y.o. female and has risk factors for diabetes including obesity and insulin resistance. We discussed intensive lifestyle modifications today with an emphasis on weight loss as well as increasing exercise and decreasing simple carbohydrates in her diet.  RLS (restless legs syndrome) Heather [name] agrees to take Requip 3 mg at bedtime daily #30 with no refills and follow up as directed.  Obesity Heather [name] is currently in the action stage of change. As such, her goal is to continue with weight loss efforts She has agreed to follow the Category 4 plan +100 calories Heather [name] has been instructed to work up to a goal of 150 minutes of combined cardio and strengthening exercise per week for weight loss and overall health benefits. We discussed the following Behavioral Modification Strategies today: increase H2O intake, no skipping meals, keeping healthy foods in the home, increasing lean protein intake, decreasing simple carbohydrates, increasing vegetables, decrease eating out, work on meal planning and intentional eating, dealing with family or coworker sabotage, holiday eating strategies, celebration eating strategies, ways to avoid boredom eating and avoiding temptations Grocery list and plan, snack ideas and fruit choices were given to patient today. Heather [name] will stop all soda and juice.  Heather [name] has agreed to follow up with our clinic in 2 weeks. She was informed of the importance of frequent follow up visits to maximize her success with intensive lifestyle modifications for her multiple health conditions.  ALLERGIES: Allergies  Allergen Reactions  . Bee Venom Shortness Of Breath  .  Penicillins Anaphylaxis    Has patient had a PCN reaction causing immediate rash, facial/tongue/throat swelling, SOB or lightheadedness with hypotension: No Has patient had a PCN reaction causing severe rash involving mucus membranes or skin necrosis: No Has patient had a PCN reaction that required hospitalization No Has patient had a PCN reaction occurring within the last 10 years: No If all of the above answers are "NO", then may proceed with Cephalosporin use.'  REACTION: Angioedema  . Adhesive [Tape] Rash  . Latex Rash  . Vancomycin Other (See Comments)    Pt can tolerate Vancomycin but did cause Red-Man Syndrome.  Recommend to pre-medicate with Benadryl before doses administered.      MEDICATIONS: Current Outpatient Medications on File Prior to Visit  Medication Sig Dispense Refill  . albuterol (VENTOLIN HFA) 108 (90 Base) MCG/ACT inhaler Inhale 2 puffs into the lungs every 4 (four) hours as needed for wheezing or shortness of breath. 1 Inhaler 1  . budesonide-formoterol (SYMBICORT) 160-4.5 MCG/ACT inhaler Inhale 2 puffs into the lungs 2 (two) times a day for 30 days. 1 Inhaler 5  . buPROPion (WELLBUTRIN XL) 300 MG 24 hr tablet Take 300 mg by mouth daily.   3  . busPIRone (BUSPAR) 10 MG tablet Take 20 mg by mouth 2 (two) times a day.    . Cholecalciferol (VITAMIN D3 SUPER STRENGTH) 50 MCG (  2000 UT) CAPS Take 2,000 Units by mouth daily.    . Cholecalciferol (VITAMIN D3) 5000 units CAPS Take 10,000 Units by mouth daily.     . citalopram (CELEXA) 40 MG tablet Take 40 mg by mouth at bedtime.     . Cyanocobalamin (VITAMIN B-12) 5000 MCG TBDP Take 5,000 mcg by mouth daily.    . fluticasone (FLONASE) 50 MCG/ACT nasal spray Place 2 sprays into both nostrils daily as needed for allergies or rhinitis. 16 g 5  . fluticasone (FLOVENT HFA) 110 MCG/ACT inhaler Inhale 2 puffs into the lungs 2 (two) times daily. 1 Inhaler 5  . loratadine (CLARITIN) 10 MG tablet Take 1 tablet (10 mg total) by mouth  daily as needed. (Patient taking differently: Take 10 mg by mouth daily. ) 30 tablet 5  . Methylsulfonylmethane (MSM) 1000 MG CAPS Take 1,000 mg by mouth daily.     . montelukast (SINGULAIR) 10 MG tablet Take 1 tablet (10 mg total) by mouth at bedtime. 30 tablet 5  . pantoprazole (PROTONIX) 40 MG tablet Take 1 tablet (40 mg total) by mouth 2 (two) times daily. 60 tablet 2  . Prenatal Vit-Fe Fumarate-FA (MULTIVITAMIN-PRENATAL) 27-0.8 MG TABS tablet Take 1 tablet by mouth daily at 12 noon.    . triamcinolone ointment (KENALOG) 0.5 % 1 application twice daily as needed for rash. (Patient taking differently: Apply 1 application topically 2 (two) times daily as needed (skin irritation/rash). ) 30 g 1  . Turmeric 500 MG CAPS Take 500 mg by mouth daily.     . vitamin E 400 UNIT capsule Take 400 Units by mouth daily.     Current Facility-Administered Medications on File Prior to Visit  Medication Dose Route Frequency Provider Last Rate Last Dose  . [COMPLETED] ipratropium (ATROVENT) nebulizer solution 0.5 mg  0.5 mg Nebulization Once Gean Quint, MD   0.5 mg at 03/28/15 1725    PAST MEDICAL HISTORY: Past Medical History:  Diagnosis Date  . Allergy   . Amenorrhea   . Anemia    post partum   . Anxiety   . Anxiety   . Arthritis   . Asthma   . Back pain   . Constipation   . COPD (chronic obstructive pulmonary disease) ([place])   . Delivery with history of C-section   . Depression   . Depression   . Diabetes mellitus without complication [facility])    patient denies but states she has hyperglycemia-diet controlled  . Dysmenorrhea   . Dysrhythmia    DR Johnsie Cancel    . Ectopic pregnancy 2013  . Edema, lower extremity   . Eosinophilic esophagitis    Diagnosed at [facility]/16/2013, untreated  . Gallbladder problem   . GERD (gastroesophageal reflux disease)    HEARTBURN   TUMS  . Hard to intubate 11/07/2015  . High cholesterol   . IBS (irritable bowel syndrome)   . Leukocytosis 07/28/2008    Qualifier: Diagnosis of  By: Jonna Munro MD, Roderic Scarce    . Morbid obesity ([place])   . Neuromuscular disorder (HCC)    RESTLESS LEG   . Obesity   . Schizoaffective disorder, bipolar type ([place])   . Sepsis ([place]) 11/11/2014  . Shortness of breath    WITH EXERTION   . Sleep apnea    CPAP- in process of restarting     PAST SURGICAL HISTORY: Past Surgical History:  Procedure Laterality Date  . BIOPSY  08/13/2018   Procedure: BIOPSY;  Surgeon: Yetta Flock, MD;  Location: [facility];  Service: Gastroenterology;;  . CESAREAN SECTION MULTI-GESTATIONAL N/A 02/03/2017   Procedure: CESAREAN SECTION MULTI-GESTATIONAL;  Surgeon: Jonnie Kind, MD;  Location: [facility];  Service: Obstetrics;  Laterality: N/A;  . CHOLECYSTECTOMY    . COLONOSCOPY WITH PROPOFOL N/A 08/13/2018   Procedure: COLONOSCOPY WITH PROPOFOL;  Surgeon: Yetta Flock, MD;  Location: [facility];  Service: Gastroenterology;  Laterality: N/A;  . DENTAL SURGERY    . ESOPHAGOGASTRODUODENOSCOPY  May 2007   Dr. Gala Romney: Normal esophagus, stomach, D1, D2  . ESOPHAGOGASTRODUODENOSCOPY  06/16/2013   Dr. Carlton Adam, eosinophilic esophagitis, reactive gastropathy, no esophageal dilation  . ESOPHAGOGASTRODUODENOSCOPY (EGD) WITH PROPOFOL N/A 08/13/2018   Procedure: ESOPHAGOGASTRODUODENOSCOPY (EGD) WITH PROPOFOL;  Surgeon: Yetta Flock, MD;  Location: [facility];  Service: Gastroenterology;  Laterality: N/A;  . POLYPECTOMY  08/13/2018   Procedure: POLYPECTOMY;  Surgeon: Yetta Flock, MD;  Location: [facility];  Service: Gastroenterology;;  . TONSILLECTOMY    . TOOTH EXTRACTION  10/28/2011   Procedure: DENTAL RESTORATION/EXTRACTIONS;  Surgeon: Gae Bon, DDS;  Location[facility];  Service: Oral Surgery;;  . UPPER GASTROINTESTINAL ENDOSCOPY      SOCIAL HISTORY: Social History   Tobacco Use  . Smoking status: Former Smoker    Packs/day: 0.50    Years: 8.00    Pack years: 4.00    Types: Cigarettes     Quit date: 04/25/2011    Years since quitting: 7.4  . Smokeless tobacco: Never Used  Substance Use Topics  . Alcohol use: No  . Drug use: No    FAMILY HISTORY: Family History  Problem Relation Age of Onset  . Depression Mother   . Anxiety disorder Mother   . High blood pressure Mother   . Bipolar disorder Mother   . Eating disorder Mother   . Obesity Mother   . Hypertension Sister   . Allergic rhinitis Sister   . Colon polyps Maternal Grandmother        68s  . Diabetes Maternal Grandmother   . Anxiety disorder Maternal Grandmother   . COPD Maternal Grandmother   . Crohn's disease Maternal Aunt   . Cancer Maternal Grandfather        prostate  . HIV/AIDS Father   . Eating disorder Father   . Obesity Father   . Liver disease Neg Hx   . Angioedema Neg Hx   . Eczema Neg Hx   . Immunodeficiency Neg Hx   . Asthma Neg Hx   . Urticaria Neg Hx   . Colon cancer Neg Hx   . Esophageal cancer Neg Hx   . Rectal cancer Neg Hx   . Stomach cancer Neg Hx     ROS: Review of Systems  Constitutional: Positive for malaise/fatigue and weight loss.  Cardiovascular:       Negative for congestive heart failure  Gastrointestinal: Negative for nausea and vomiting.  Genitourinary: Negative for frequency.  Musculoskeletal:       Negative for muscle weakness Positive for leg movements  Endo/Heme/Allergies: Negative for polydipsia.    PHYSICAL EXAM: Blood pressure 136/75, pulse 89, temperature 98.7 F (37.1 C), temperature source Oral, height 5\' 3"  (1.6 m), weight (!) 450 lb (204.1 kg), SpO2 96 %, not currently breastfeeding. Body mass index is 79.71 kg/m. Physical Exam Vitals signs reviewed.  Constitutional:      Appearance: Normal appearance. She is well-developed. She is obese.  Cardiovascular:     Rate and Rhythm: Normal rate.  Pulmonary:  Effort: Pulmonary effort is normal.  Musculoskeletal: Normal range of motion.  Skin:    General: Skin is warm and dry.  Neurological:       Mental Status: She is alert and oriented to person, place, and time.  Psychiatric:        Mood and Affect: Mood normal.        Behavior: Behavior normal.     RECENT LABS AND TESTS: BMET    Component Value Date/Time   NA 136 08/04/2018 1238   NA 140 06/11/2018 1301   K 4.0 08/04/2018 1238   CL 100 08/04/2018 1238   CO2 28 08/04/2018 1238   GLUCOSE 86 08/04/2018 1238   BUN 15 08/04/2018 1238   BUN 16 06/11/2018 1301   CREATININE 0.74 08/04/2018 1238   CREATININE 0.70 07/14/2014 1451   CALCIUM 9.6 08/04/2018 1238   GFRNONAA >60 06/22/2018 1401   GFRAA >60 06/22/2018 1401   Lab Results  Component Value Date   HGBA1C 5.3 09/24/2018   HGBA1C 5.5 06/11/2018   HGBA1C 5.3 01/13/2017   HGBA1C 5.1 11/28/2016   HGBA1C 5.5 12/06/2014   Lab Results  Component Value Date   INSULIN 36.0 (H) 06/11/2018   CBC    Component Value Date/Time   WBC 9.7 08/04/2018 1238   RBC 4.97 08/04/2018 1238   HGB 13.3 08/04/2018 1238   HGB 11.5 06/11/2018 1301   HCT 41.1 08/04/2018 1238   HCT 36.7 06/11/2018 1301   PLT 346.0 08/04/2018 1238   PLT 383 (H) 01/20/2017 1137   MCV 82.7 08/04/2018 1238   MCV 81 06/11/2018 1301   MCH 26.3 06/22/2018 1401   MCHC 32.3 08/04/2018 1238   RDW 17.3 (H) 08/04/2018 1238   RDW 14.3 06/11/2018 1301   LYMPHSABS 2.3 08/04/2018 1238   LYMPHSABS 1.8 06/11/2018 1301   MONOABS 0.7 08/04/2018 1238   EOSABS 0.6 08/04/2018 1238   EOSABS 0.3 06/11/2018 1301   BASOSABS 0.1 08/04/2018 1238   BASOSABS 0.0 06/11/2018 1301   Iron/TIBC/Ferritin/ %Sat    Component Value Date/Time   FERRITIN 43 06/10/2006 2218   Lipid Panel     Component Value Date/Time   CHOL 115 06/11/2018 1301   TRIG 120 06/11/2018 1301   HDL 43 06/11/2018 1301   CHOLHDL 3.9 Ratio 06/30/2008 2123   VLDL 52 (H) 06/30/2008 2123   LDLCALC 48 06/11/2018 1301   Hepatic Function Panel     Component Value Date/Time   PROT 8.2 08/04/2018 1238   PROT 7.1 06/11/2018 1301   ALBUMIN 4.1  08/04/2018 1238   ALBUMIN 3.9 06/11/2018 1301   AST 12 08/04/2018 1238   ALT 10 08/04/2018 1238   ALKPHOS 58 08/04/2018 1238   BILITOT 0.3 08/04/2018 1238   BILITOT 0.2 06/11/2018 1301   BILIDIR <0.1 10/23/2010 2043   IBILI NOT CALCULATED 10/23/2010 2043      Component Value Date/Time   TSH 2.220 06/11/2018 1301   TSH 1.74 11/28/2016 1439   TSH 2.088 11/11/2014 1241   TSH 2.476 11/17/2013 1515     Ref. Range 09/24/2018 16:59  Vitamin D, 25-Hydroxy Latest Ref Range: 30.0 - 100.0 ng/mL 33.1    OBESITY BEHAVIORAL INTERVENTION VISIT  Today's visit was # 2   Starting weight: 425 lbs Starting date: 06/11/2018 Today's weight : 450 lbs Today's date: 09/24/2018 Total lbs lost to date: 0    09/24/2018  Height 5\' 3"  (1.6 m)  Weight 450 lb (204.1 kg) (A)  BMI (Calculated) 79.73  BLOOD PRESSURE -  SYSTOLIC 770  BLOOD PRESSURE - DIASTOLIC 75   Body Fat % 34.0 %    ASK: We discussed the diagnosis of obesity with Coral Ceo Stoecker today and Ivry agreed to give [place] permission to discuss obesity behavioral modification therapy today.  ASSESS: Dezaree has the diagnosis of obesity and her BMI today is 79.73 Jelisha is in the action stage of change   ADVISE: Bethanie was educated on the multiple health risks of obesity as well as the benefit of weight loss to improve her health. She was advised of the need for long term treatment and the importance of lifestyle modifications to improve her current health and to decrease her risk of future health problems.  AGREE: Multiple dietary modification options and treatment options were discussed and  Taneshia agreed to follow the recommendations documented in the above note.  ARRANGE: Lindora was educated on the importance of frequent visits to treat obesity as outlined per CMS and USPSTF guidelines and agreed to schedule her next follow up appointment today.  Corey Skains, am acting as Location manager for [employer]. Owens Shark, DO  I have  reviewed the above documentation for accuracy and completeness, and I agree with the above. -Jearld Lesch, DO

## 2018-10-14 ENCOUNTER — Other Ambulatory Visit: Payer: Self-pay

## 2018-10-14 ENCOUNTER — Ambulatory Visit (INDEPENDENT_AMBULATORY_CARE_PROVIDER_SITE_OTHER): Payer: Medicaid Other | Admitting: Bariatrics

## 2018-10-14 DIAGNOSIS — Z6841 Body Mass Index (BMI) 40.0 and over, adult: Secondary | ICD-10-CM

## 2018-10-14 DIAGNOSIS — E8881 Metabolic syndrome: Secondary | ICD-10-CM | POA: Diagnosis not present

## 2018-10-14 DIAGNOSIS — E559 Vitamin D deficiency, unspecified: Secondary | ICD-10-CM

## 2018-10-14 NOTE — Progress Notes (Signed)
Office: [phone]  /  Fax: [phone] TeleHealth Visit:  Heather [name] has verbally consented to this TeleHealth visit today. The patient is located at home, the provider is located at the [employer] [facility] office. The participants in this visit include the listed provider and patient. The visit was conducted today via Webex.  HPI:   Chief Complaint: OBESITY Heather [name] is here to discuss her progress with her obesity treatment plan. She is on the Category 4 plan + 100 calories and is following her eating plan approximately 80% of the time. She states she is exercising 0 minutes 0 times per week. Heather [name] states that she has lost about 2 lbs. She also states that it has been difficult to stay on the diet due to stomach upset and diarrhea for 3 days. She states that it is not hard to follow the diet. We were unable to weigh the patient today for this TeleHealth visit. She feels as if she has lost 2 lbs since her last visit. She has lost 0 lbs since starting treatment with [place].  Vitamin D deficiency Heather [name] has a diagnosis of Vitamin D deficiency. Her last Vitamin D level was reported to be 33.1 on 09/24/2018. She is currently taking OTC Vit D and denies nausea, vomiting or muscle weakness.  Insulin Resistance Heather [name] has a diagnosis of insulin resistance based on her elevated fasting insulin level >5. Her last A1c was 5.3 on 09/24/2018 and her insulin was 36.0 on 06/11/2018.  Although Heather [name]'s blood glucose readings are still under good control, insulin resistance puts her at greater risk of metabolic syndrome and diabetes. She is not on any medications currently and continues to work on diet and exercise to decrease risk of diabetes. She denies polyphagia.  ASSESSMENT AND PLAN:  Vitamin D deficiency  Insulin resistance  BMI 70 and over, adult [facility])  PLAN:  Vitamin D Deficiency Heather [name] was informed that low Vitamin D levels contribute to fatigue and are associated with  obesity, breast, and colon cancer. She will discontinue OTC Vitamin D and was given a prescription for Vitamin D 50,000 IU once weekly #4 with 0 refills and will follow-up for routine testing of Vitamin D, at least 2-3 times per year. She was informed of the risk of over-replacement of Vitamin D and agrees to not increase her dose unless she discusses this with [place] first. Heather [name] agrees to follow-up with our clinic in 4 weeks.  Insulin Resistance Heather [name] will continue to work on weight loss, exercise, and decreasing simple carbohydrates in her diet to help decrease the risk of diabetes. We dicussed metformin including benefits and risks. She was informed that eating too many simple carbohydrates or too many calories at one sitting increases the likelihood of GI side effects. Heather [name] will decrease her carbohydrates and increase her protein. She will follow-up with [place] as directed to monitor her progress.  Obesity Heather [name] is currently in the action stage of change. As such, her goal is to continue with weight loss efforts. She has agreed to follow the Category 4 plan + 100 calories. Heather [name] will work on meal planning and intentional eating. She has Zofran she can take and will follow a BRAT diet for the next several days. Heather [name] has been instructed to work up to a goal of 150 minutes of combined cardio and strengthening exercise per week for weight loss and overall health benefits. We discussed the following Behavioral Modification Strategies today: increasing lean protein intake, decreasing simple carbohydrates, increasing vegetables, increase  H20 intake, decrease eating out, no skipping meals, work on meal planning, easy cooking plans, and keeping healthy foods in the home.  Heather [name] has agreed to follow-up with our clinic in 4 weeks. She was informed of the importance of frequent follow-up visits to maximize her success with intensive lifestyle modifications for her multiple health conditions.   ALLERGIES: Allergies  Allergen Reactions  . Bee Venom Shortness Of Breath  . Penicillins Anaphylaxis    Has patient had a PCN reaction causing immediate rash, facial/tongue/throat swelling, SOB or lightheadedness with hypotension: No Has patient had a PCN reaction causing severe rash involving mucus membranes or skin necrosis: No Has patient had a PCN reaction that required hospitalization No Has patient had a PCN reaction occurring within the last 10 years: No If all of the above answers are "NO", then may proceed with Cephalosporin use.'  REACTION: Angioedema  . Adhesive [Tape] Rash  . Latex Rash  . Vancomycin Other (See Comments)    Pt can tolerate Vancomycin but did cause Red-Man Syndrome.  Recommend to pre-medicate with Benadryl before doses administered.      MEDICATIONS: Current Outpatient Medications on File Prior to Visit  Medication Sig Dispense Refill  . albuterol (VENTOLIN HFA) 108 (90 Base) MCG/ACT inhaler Inhale 2 puffs into the lungs every 4 (four) hours as needed for wheezing or shortness of breath. 1 Inhaler 1  . budesonide-formoterol (SYMBICORT) 160-4.5 MCG/ACT inhaler Inhale 2 puffs into the lungs 2 (two) times a day for 30 days. 1 Inhaler 5  . buPROPion (WELLBUTRIN XL) 300 MG 24 hr tablet Take 300 mg by mouth daily.   3  . busPIRone (BUSPAR) 10 MG tablet Take 20 mg by mouth 2 (two) times a day.    . Cholecalciferol (VITAMIN D3 SUPER STRENGTH) 50 MCG (2000 UT) CAPS Take 2,000 Units by mouth daily.    . Cholecalciferol (VITAMIN D3) 5000 units CAPS Take 10,000 Units by mouth daily.     . citalopram (CELEXA) 40 MG tablet Take 40 mg by mouth at bedtime.     . Cyanocobalamin (VITAMIN B-12) 5000 MCG TBDP Take 5,000 mcg by mouth daily.    . fluticasone (FLONASE) 50 MCG/ACT nasal spray Place 2 sprays into both nostrils daily as needed for allergies or rhinitis. 16 g 5  . fluticasone (FLOVENT HFA) 110 MCG/ACT inhaler Inhale 2 puffs into the lungs 2 (two) times daily. 1  Inhaler 5  . indapamide (LOZOL) 2.5 MG tablet Take 1 tablet (2.5 mg total) by mouth daily. 30 tablet 0  . loratadine (CLARITIN) 10 MG tablet Take 1 tablet (10 mg total) by mouth daily as needed. (Patient taking differently: Take 10 mg by mouth daily. ) 30 tablet 5  . Methylsulfonylmethane (MSM) 1000 MG CAPS Take 1,000 mg by mouth daily.     . montelukast (SINGULAIR) 10 MG tablet Take 1 tablet (10 mg total) by mouth at bedtime. 30 tablet 5  . pantoprazole (PROTONIX) 40 MG tablet Take 1 tablet (40 mg total) by mouth 2 (two) times daily. 60 tablet 2  . Prenatal Vit-Fe Fumarate-FA (MULTIVITAMIN-PRENATAL) 27-0.8 MG TABS tablet Take 1 tablet by mouth daily at 12 noon.    Marland Kitchen rOPINIRole (REQUIP) 3 MG tablet Take 1 tablet (3 mg total) by mouth at bedtime. 30 tablet 0  . triamcinolone ointment (KENALOG) 0.5 % 1 application twice daily as needed for rash. (Patient taking differently: Apply 1 application topically 2 (two) times daily as needed (skin irritation/rash). ) 30 g 1  .  Turmeric 500 MG CAPS Take 500 mg by mouth daily.     . vitamin E 400 UNIT capsule Take 400 Units by mouth daily.     Current Facility-Administered Medications on File Prior to Visit  Medication Dose Route Frequency Provider Last Rate Last Dose  . [COMPLETED] ipratropium (ATROVENT) nebulizer solution 0.5 mg  0.5 mg Nebulization Once Gean Quint, MD   0.5 mg at 03/28/15 1725    PAST MEDICAL HISTORY: Past Medical History:  Diagnosis Date  . Allergy   . Amenorrhea   . Anemia    post partum   . Anxiety   . Anxiety   . Arthritis   . Asthma   . Back pain   . Constipation   . COPD (chronic obstructive pulmonary disease) ([place])   . Delivery with history of C-section   . Depression   . Depression   . Diabetes mellitus without complication [facility])    patient denies but states she has hyperglycemia-diet controlled  . Dysmenorrhea   . Dysrhythmia    DR Johnsie Cancel    . Ectopic pregnancy 2013  . Edema, lower extremity   .  Eosinophilic esophagitis    Diagnosed at [facility] 06/16/2013, untreated  . Gallbladder problem   . GERD (gastroesophageal reflux disease)    HEARTBURN   TUMS  . Hard to intubate 11/07/2015  . High cholesterol   . IBS (irritable bowel syndrome)   . Leukocytosis 07/28/2008   Qualifier: Diagnosis of  By: Jonna Munro MD, Roderic Scarce    . Morbid obesity ([place])   . Neuromuscular disorder ([facility])    RESTLESS LEG   . Obesity   . Schizoaffective disorder, bipolar type ([place])   . Sepsis ([place]) 11/11/2014  . Shortness of breath    WITH EXERTION   . Sleep apnea    CPAP- in process of restarting     PAST SURGICAL HISTORY: Past Surgical History:  Procedure Laterality Date  . BIOPSY  08/13/2018   Procedure: BIOPSY;  Surgeon: Yetta Flock, MD;  Location: [facility];  Service: Gastroenterology;;  . CESAREAN SECTION MULTI-GESTATIONAL N/A 02/03/2017   Procedure: CESAREAN SECTION MULTI-GESTATIONAL;  Surgeon: Jonnie Kind, MD;  Location: [place];  Service: Obstetrics;  Laterality: N/A;  . CHOLECYSTECTOMY    . COLONOSCOPY WITH PROPOFOL N/A 08/13/2018   Procedure: COLONOSCOPY WITH PROPOFOL;  Surgeon: Yetta Flock, MD;  Location: [facility];  Service: Gastroenterology;  Laterality: N/A;  . DENTAL SURGERY    . ESOPHAGOGASTRODUODENOSCOPY  May 2007   Dr. Gala Romney: Normal esophagus, stomach, D1, D2  . ESOPHAGOGASTRODUODENOSCOPY  06/16/2013   Dr. Carlton Adam, eosinophilic esophagitis, reactive gastropathy, no esophageal dilation  . ESOPHAGOGASTRODUODENOSCOPY (EGD) WITH PROPOFOL N/A 08/13/2018   Procedure: ESOPHAGOGASTRODUODENOSCOPY (EGD) WITH PROPOFOL;  Surgeon: Yetta Flock, MD;  Location: [facility];  Service: Gastroenterology;  Laterality: N/A;  . POLYPECTOMY  08/13/2018   Procedure: POLYPECTOMY;  Surgeon: Yetta Flock, MD;  Location: [facility];  Service: Gastroenterology;;  . TONSILLECTOMY    . TOOTH EXTRACTION  10/28/2011   Procedure: DENTAL RESTORATION/EXTRACTIONS;   Surgeon: Gae Bon, DDS;  Location: [facility] OR;  Service: Oral Surgery;;  . UPPER GASTROINTESTINAL ENDOSCOPY      SOCIAL HISTORY: Social History   Tobacco Use  . Smoking status: Former Smoker    Packs/day: 0.50    Years: 8.00    Pack years: 4.00    Types: Cigarettes    Quit date: 04/25/2011    Years since quitting: 7.4  . Smokeless tobacco:  Never Used  Substance Use Topics  . Alcohol use: No  . Drug use: No    FAMILY HISTORY: Family History  Problem Relation Age of Onset  . Depression Mother   . Anxiety disorder Mother   . High blood pressure Mother   . Bipolar disorder Mother   . Eating disorder Mother   . Obesity Mother   . Hypertension Sister   . Allergic rhinitis Sister   . Colon polyps Maternal Grandmother        39s  . Diabetes Maternal Grandmother   . Anxiety disorder Maternal Grandmother   . COPD Maternal Grandmother   . Crohn's disease Maternal Aunt   . Cancer Maternal Grandfather        prostate  . HIV/AIDS Father   . Eating disorder Father   . Obesity Father   . Liver disease Neg Hx   . Angioedema Neg Hx   . Eczema Neg Hx   . Immunodeficiency Neg Hx   . Asthma Neg Hx   . Urticaria Neg Hx   . Colon cancer Neg Hx   . Esophageal cancer Neg Hx   . Rectal cancer Neg Hx   . Stomach cancer Neg Hx    ROS: Review of Systems  Gastrointestinal: Negative for nausea and vomiting.  Musculoskeletal:       Negative for muscle weakness.  Endo/Heme/Allergies:       Negative for polyphagia.   PHYSICAL EXAM: Pt in no acute distress  RECENT LABS AND TESTS: BMET    Component Value Date/Time   NA 136 08/04/2018 1238   NA 140 06/11/2018 1301   K 4.0 08/04/2018 1238   CL 100 08/04/2018 1238   CO2 28 08/04/2018 1238   GLUCOSE 86 08/04/2018 1238   BUN 15 08/04/2018 1238   BUN 16 06/11/2018 1301   CREATININE 0.74 08/04/2018 1238   CREATININE 0.70 07/14/2014 1451   CALCIUM 9.6 08/04/2018 1238   GFRNONAA >60 06/22/2018 1401   GFRAA >60 06/22/2018 1401    Lab Results  Component Value Date   HGBA1C 5.3 09/24/2018   HGBA1C 5.5 06/11/2018   HGBA1C 5.3 01/13/2017   HGBA1C 5.1 11/28/2016   HGBA1C 5.5 12/06/2014   Lab Results  Component Value Date   INSULIN 36.0 (H) 06/11/2018   CBC    Component Value Date/Time   WBC 9.7 08/04/2018 1238   RBC 4.97 08/04/2018 1238   HGB 13.3 08/04/2018 1238   HGB 11.5 06/11/2018 1301   HCT 41.1 08/04/2018 1238   HCT 36.7 06/11/2018 1301   PLT 346.0 08/04/2018 1238   PLT 383 (H) 01/20/2017 1137   MCV 82.7 08/04/2018 1238   MCV 81 06/11/2018 1301   MCH 26.3 06/22/2018 1401   MCHC 32.3 08/04/2018 1238   RDW 17.3 (H) 08/04/2018 1238   RDW 14.3 06/11/2018 1301   LYMPHSABS 2.3 08/04/2018 1238   LYMPHSABS 1.8 06/11/2018 1301   MONOABS 0.7 08/04/2018 1238   EOSABS 0.6 08/04/2018 1238   EOSABS 0.3 06/11/2018 1301   BASOSABS 0.1 08/04/2018 1238   BASOSABS 0.0 06/11/2018 1301   Iron/TIBC/Ferritin/ %Sat    Component Value Date/Time   FERRITIN 43 06/10/2006 2218   Lipid Panel     Component Value Date/Time   CHOL 115 06/11/2018 1301   TRIG 120 06/11/2018 1301   HDL 43 06/11/2018 1301   CHOLHDL 3.9 Ratio 06/30/2008 2123   VLDL 52 (H) 06/30/2008 2123   LDLCALC 48 06/11/2018 1301   Hepatic Function Panel  Component Value Date/Time   PROT 8.2 08/04/2018 1238   PROT 7.1 06/11/2018 1301   ALBUMIN 4.1 08/04/2018 1238   ALBUMIN 3.9 06/11/2018 1301   AST 12 08/04/2018 1238   ALT 10 08/04/2018 1238   ALKPHOS 58 08/04/2018 1238   BILITOT 0.3 08/04/2018 1238   BILITOT 0.2 06/11/2018 1301   BILIDIR <0.1 10/23/2010 2043   IBILI NOT CALCULATED 10/23/2010 2043      Component Value Date/Time   TSH 2.220 06/11/2018 1301   TSH 1.74 11/28/2016 1439   TSH 2.088 11/11/2014 1241   TSH 2.476 11/17/2013 1515   Results for Polack, Mayley LYNN (MRN [id number]) as of 10/14/2018 16:08  Ref. Range 09/24/2018 16:59  Vitamin D, 25-Hydroxy Latest Ref Range: 30.0 - 100.0 ng/mL 33.1   I, Michaelene Song, am acting  as Location manager for [employer], DO  I have reviewed the above documentation for accuracy and completeness, and I agree with the above. -Jearld Lesch, DO

## 2018-10-15 ENCOUNTER — Encounter (INDEPENDENT_AMBULATORY_CARE_PROVIDER_SITE_OTHER): Payer: Self-pay | Admitting: Bariatrics

## 2018-10-20 ENCOUNTER — Other Ambulatory Visit (INDEPENDENT_AMBULATORY_CARE_PROVIDER_SITE_OTHER): Payer: Self-pay | Admitting: Bariatrics

## 2018-10-20 DIAGNOSIS — I10 Essential (primary) hypertension: Secondary | ICD-10-CM

## 2018-10-21 ENCOUNTER — Other Ambulatory Visit (INDEPENDENT_AMBULATORY_CARE_PROVIDER_SITE_OTHER): Payer: Self-pay | Admitting: Bariatrics

## 2018-10-21 ENCOUNTER — Other Ambulatory Visit (INDEPENDENT_AMBULATORY_CARE_PROVIDER_SITE_OTHER): Payer: Self-pay

## 2018-10-21 DIAGNOSIS — G2581 Restless legs syndrome: Secondary | ICD-10-CM

## 2018-10-21 DIAGNOSIS — I10 Essential (primary) hypertension: Secondary | ICD-10-CM

## 2018-10-21 MED ORDER — INDAPAMIDE 2.5 MG PO TABS: 2.5000 mg | ORAL_TABLET | Freq: Every day | ORAL | 0 refills | Status: DC

## 2018-10-21 MED ORDER — ROPINIROLE HCL 3 MG PO TABS: 3.0000 mg | ORAL_TABLET | Freq: Every day | ORAL | 0 refills | Status: DC

## 2018-10-28 ENCOUNTER — Other Ambulatory Visit: Payer: Self-pay

## 2018-10-28 MED ORDER — PANTOPRAZOLE SODIUM 40 MG PO TBEC: 40.0000 mg | DELAYED_RELEASE_TABLET | Freq: Two times a day (BID) | ORAL | 1 refills | Status: DC

## 2018-10-28 NOTE — Progress Notes (Signed)
Refill Protonix bid

## 2018-11-04 ENCOUNTER — Other Ambulatory Visit: Payer: Self-pay

## 2018-11-04 ENCOUNTER — Emergency Department (HOSPITAL_COMMUNITY): Payer: Medicaid Other

## 2018-11-04 ENCOUNTER — Emergency Department (HOSPITAL_COMMUNITY)
Admission: EM | Admit: 2018-11-04 | Discharge: 2018-11-04 | Disposition: A | Discharge status: Home / Self Care | Payer: Medicaid Other | Attending: Emergency Medicine | Admitting: Emergency Medicine

## 2018-11-04 ENCOUNTER — Encounter (HOSPITAL_COMMUNITY): Payer: Self-pay

## 2018-11-04 DIAGNOSIS — R200 Anesthesia of skin: Secondary | ICD-10-CM | POA: Insufficient documentation

## 2018-11-04 DIAGNOSIS — Z91040 Latex allergy status: Secondary | ICD-10-CM | POA: Diagnosis not present

## 2018-11-04 DIAGNOSIS — N3001 Acute cystitis with hematuria: Secondary | ICD-10-CM | POA: Diagnosis not present

## 2018-11-04 DIAGNOSIS — Z79899 Other long term (current) drug therapy: Secondary | ICD-10-CM | POA: Diagnosis not present

## 2018-11-04 DIAGNOSIS — R05 Cough: Secondary | ICD-10-CM | POA: Insufficient documentation

## 2018-11-04 DIAGNOSIS — R070 Pain in throat: Secondary | ICD-10-CM | POA: Diagnosis not present

## 2018-11-04 DIAGNOSIS — M545 Low back pain, unspecified: Secondary | ICD-10-CM

## 2018-11-04 DIAGNOSIS — Z87891 Personal history of nicotine dependence: Secondary | ICD-10-CM | POA: Insufficient documentation

## 2018-11-04 DIAGNOSIS — R1110 Vomiting, unspecified: Secondary | ICD-10-CM | POA: Diagnosis not present

## 2018-11-04 DIAGNOSIS — J449 Chronic obstructive pulmonary disease, unspecified: Secondary | ICD-10-CM | POA: Diagnosis not present

## 2018-11-04 DIAGNOSIS — E119 Type 2 diabetes mellitus without complications: Secondary | ICD-10-CM | POA: Diagnosis not present

## 2018-11-04 DIAGNOSIS — R110 Nausea: Secondary | ICD-10-CM | POA: Insufficient documentation

## 2018-11-04 LAB — URINALYSIS, ROUTINE W REFLEX MICROSCOPIC
Bilirubin Urine: NEGATIVE
Glucose, UA: NEGATIVE mg/dL
Ketones, ur: 5 mg/dL — AB
Nitrite: NEGATIVE
Protein, ur: 100 mg/dL — AB
Specific Gravity, Urine: 1.021 (ref 1.005–1.030)
WBC, UA: >50 WBC/hpf — ABNORMAL HIGH (ref 0–5)
pH: 6 (ref 5.0–8.0)

## 2018-11-04 LAB — CBC WITH DIFFERENTIAL/PLATELET
Abs Immature Granulocytes: 0.06 K/uL (ref 0.00–0.07)
Basophils Absolute: 0 K/uL (ref 0.0–0.1)
Basophils Relative: 0 %
Eosinophils Absolute: 0.4 K/uL (ref 0.0–0.5)
Eosinophils Relative: 4 %
HCT: 37.8 % (ref 36.0–46.0)
Hemoglobin: 11.4 g/dL — ABNORMAL LOW (ref 12.0–15.0)
Immature Granulocytes: 1 %
Lymphocytes Relative: 19 %
Lymphs Abs: 1.9 K/uL (ref 0.7–4.0)
MCH: 26.3 pg (ref 26.0–34.0)
MCHC: 30.2 g/dL (ref 30.0–36.0)
MCV: 87.3 fL (ref 80.0–100.0)
Monocytes Absolute: 1 K/uL (ref 0.1–1.0)
Monocytes Relative: 10 %
Neutro Abs: 6.5 K/uL (ref 1.7–7.7)
Neutrophils Relative %: 66 %
Platelets: 358 K/uL (ref 150–400)
RBC: 4.33 MIL/uL (ref 3.87–5.11)
RDW: 14.6 % (ref 11.5–15.5)
WBC: 10 K/uL (ref 4.0–10.5)
nRBC: 0 % (ref 0.0–0.2)

## 2018-11-04 LAB — COMPREHENSIVE METABOLIC PANEL WITH GFR
ALT: 19 U/L (ref 0–44)
AST: 20 U/L (ref 15–41)
Albumin: 3.4 g/dL — ABNORMAL LOW (ref 3.5–5.0)
Alkaline Phosphatase: 55 U/L (ref 38–126)
Anion gap: 10 (ref 5–15)
BUN: 14 mg/dL (ref 6–20)
CO2: 27 mmol/L (ref 22–32)
Calcium: 9.3 mg/dL (ref 8.9–10.3)
Chloride: 103 mmol/L (ref 98–111)
Creatinine, Ser: 0.8 mg/dL (ref 0.44–1.00)
GFR calc Af Amer: >60 mL/min
GFR calc non Af Amer: >60 mL/min
Glucose, Bld: 85 mg/dL (ref 70–99)
Potassium: 3.1 mmol/L — ABNORMAL LOW (ref 3.5–5.1)
Sodium: 140 mmol/L (ref 135–145)
Total Bilirubin: 0.6 mg/dL (ref 0.3–1.2)
Total Protein: 7.8 g/dL (ref 6.5–8.1)

## 2018-11-04 MED ORDER — HYDROCODONE-ACETAMINOPHEN 5-325 MG PO TABS: 1.0000 | ORAL_TABLET | Freq: Four times a day (QID) | ORAL | 0 refills | Status: DC | PRN

## 2018-11-04 MED ORDER — ONDANSETRON HCL 4 MG/2ML IJ SOLN
4.0000 mg | Freq: Once | INTRAMUSCULAR | Status: AC
  Administered 2018-11-04: 4 mg via INTRAVENOUS
  Filled 2018-11-04: qty 2

## 2018-11-04 MED ORDER — FENTANYL CITRATE (PF) 100 MCG/2ML IJ SOLN
50.0000 ug | Freq: Once | INTRAMUSCULAR | Status: AC
  Administered 2018-11-04: 50 ug via INTRAVENOUS
  Filled 2018-11-04: qty 2

## 2018-11-04 MED ORDER — CEPHALEXIN 500 MG PO CAPS: 500.0000 mg | ORAL_CAPSULE | Freq: Four times a day (QID) | ORAL | 0 refills | Status: DC

## 2018-11-04 MED ORDER — SODIUM CHLORIDE 0.9 % IV SOLN
1.0000 g | Freq: Once | INTRAVENOUS | Status: AC
  Administered 2018-11-04: 1 g via INTRAVENOUS
  Filled 2018-11-04: qty 10

## 2018-11-04 NOTE — ED Notes (Signed)
Patient Alert and oriented to baseline. Stable and ambulatory to baseline. Patient verbalized understanding of the discharge instructions.  Patient belongings were taken by the patient.   

## 2018-11-04 NOTE — Discharge Instructions (Signed)
It was my pleasure taking care of you today!   Please take all of your antibiotics until finished!  Follow-up with your primary care doctor for recheck of urine sample.  Call the neurosurgery spine clinic listed to schedule a follow-up appointment to discuss your back pain.  Return to ER for new or worsening symptoms, any additional concerns.

## 2018-11-04 NOTE — ED Provider Notes (Signed)
[facility]ERGENCY DEPARTMENT Provider Note   CSN: [id number] Arrival date & time: 11/04/18  1135    History   Chief Complaint No chief complaint on file.   HPI Heather [name] is a 43 y.o. female.     The history is provided by the patient and medical records. No language interpreter was used.   Heather [name] is a 43 y.o. female  with a PMH as listed below who presents to the Emergency Department complaining of persistent low back pain with numbness down her left leg.  Patient reports that this is an ongoing for several weeks to months now.  She was seen for the same in March.  Per chart review, it appears that neurosurgery was consulted after MRI who did not feel that she was a candidate for neurosurgical intervention and recommended pain control/physical therapy.  She reports that she has not had physical therapy.  She also states that she has not followed up with Dr. Lynann Bologna or any other physician to manage her back pain since seen in March in the ER.  She has been using Aleve and Tylenol with little improvement.  She took a couple of her mother's Neurontin as well which did not help.  She denies fever.  No weakness of her legs.  She tells me that she had an episode a couple of weeks ago where she was walking and looked down, realizing that she was urinating.  She did not feel as if she had to urinate.  She has not had an episode like that this week though.  She has intermittently had some nausea which improves with Zofran.  She additionally states that when she woke up this morning, her throat felt "clogged".  She coughed several times then had an episode of emesis which sounds posttussive.  She has not had persistent cough, just after awakening this morning.  Throat feels mildly sore, but no longer feels clogged.  Denies any chest pain, shortness of breath or nasal congestion.  Past Medical History:  Diagnosis Date  . Allergy   . Amenorrhea   . Anemia    post partum   . Anxiety   . Anxiety   . Arthritis   . Asthma   . Back pain   . Constipation   . COPD (chronic obstructive pulmonary disease) ([place])   . Delivery with history of C-section   . Depression   . Depression   . Diabetes mellitus without complication [facility])    patient denies but states she has hyperglycemia-diet controlled  . Dysmenorrhea   . Dysrhythmia    DR Johnsie Cancel    . Ectopic pregnancy 2013  . Edema, lower extremity   . Eosinophilic esophagitis    Diagnosed at [facility] 06/16/2013, untreated  . Gallbladder problem   . GERD (gastroesophageal reflux disease)    HEARTBURN   TUMS  . Hard to intubate 11/07/2015  . High cholesterol   . IBS (irritable bowel syndrome)   . Leukocytosis 07/28/2008   Qualifier: Diagnosis of  By: Jonna Munro MD, Roderic Scarce    . Morbid obesity ([place])   . Neuromuscular disorder (HCC)    RESTLESS LEG   . Obesity   . Schizoaffective disorder, bipolar type ([place])   . Sepsis ([place]) 11/11/2014  . Shortness of breath    WITH EXERTION   . Sleep apnea    CPAP- in process of restarting     Patient Active Problem List   Diagnosis Date Noted  . Blood  in stool   . Gastritis and gastroduodenitis   . Benign neoplasm of sigmoid colon   . Difficult intubation 08/10/2018  . Class 3 severe obesity with serious comorbidity and body mass index (BMI) greater than or equal to 70 in adult ([place]) 06/16/2018  . Nexplanon insertion 03/27/2017  . Postpartum hypertension 03/05/2017  . Status post primary low transverse cesarean section 02/03/2017  . H/O pre-eclampsia 01/27/2017  . Gestational htn w/o significant proteinuria, third trimester 01/20/2017  . Mild persistent asthma without complication 46/56/8127  . Perennial allergic rhinitis 11/05/2016  . Mild persistent asthma with acute exacerbation 11/05/2016  . Excess weight gain in pregnancy, second trimester 10/30/2016  . Anxiety and depression 11/07/2015  . Hard to intubate 11/07/2015  . Hypoglycemia 11/07/2015  .  OSA (obstructive sleep apnea) 11/07/2015  . DM type 2 (diabetes mellitus, type 2) ([place]) 12/07/2014  . Panniculitis 12/06/2014  . Cellulitis, abdominal wall 11/11/2014  . Abdominal pain 07/14/2014  . Loose stools 07/14/2014  . Melena 01/27/2014  . Eosinophilic esophagitis 51/70/0174  . Change in bowel habits 04/28/2013  . Esophageal dysphagia 04/28/2013  . Insomnia due to mental disorder(327.02) 08/08/2011  . RLS (restless legs syndrome) 08/08/2011  . Adjustment disorder with depressed mood 08/06/2011  . Schizoaffective disorder, bipolar type ([place]) 08/02/2011    Class: Acute  . PALPITATIONS, OCCASIONAL 11/01/2009  . Bipolar disorder ([place]) 10/19/2008  . DISORDER, TOBACCO USE 08/25/2008  . Leukocytosis 07/28/2008  . ALLERGIC RHINITIS, SEASONAL 08/20/2007  . DYSMETABOLIC SYNDROME 94/49/6759  . Morbid obesity ([place]) 05/13/2006  . EXTERNAL HEMORRHOIDS 05/13/2006  . HYPERLIPIDEMIA 05/12/2006  . Essential hypertension 05/12/2006  . ASTHMA 05/12/2006  . GERD (gastroesophageal reflux disease) 05/12/2006  . OSTEOARTHRITIS 05/12/2006    Past Surgical History:  Procedure Laterality Date  . BIOPSY  08/13/2018   Procedure: BIOPSY;  Surgeon: Yetta Flock, MD;  Location: [facility];  Service: Gastroenterology;;  . CESAREAN SECTION MULTI-GESTATIONAL N/A 02/03/2017   Procedure: CESAREAN SECTION MULTI-GESTATIONAL;  Surgeon: Jonnie Kind, MD;  Location: [facility];  Service: Obstetrics;  Laterality: N/A;  . CHOLECYSTECTOMY    . COLONOSCOPY WITH PROPOFOL N/A 08/13/2018   Procedure: COLONOSCOPY WITH PROPOFOL;  Surgeon: Yetta Flock, MD;  Location: [facility];  Service: Gastroenterology;  Laterality: N/A;  . DENTAL SURGERY    . ESOPHAGOGASTRODUODENOSCOPY  May 2007   Dr. Gala Romney: Normal esophagus, stomach, D1, D2  . ESOPHAGOGASTRODUODENOSCOPY  06/16/2013   Dr. Carlton Adam, eosinophilic esophagitis, reactive gastropathy, no esophageal dilation  . ESOPHAGOGASTRODUODENOSCOPY (EGD)  WITH PROPOFOL N/A 08/13/2018   Procedure: ESOPHAGOGASTRODUODENOSCOPY (EGD) WITH PROPOFOL;  Surgeon: Yetta Flock, MD;  Location: [facility];  Service: Gastroenterology;  Laterality: N/A;  . POLYPECTOMY  08/13/2018   Procedure: POLYPECTOMY;  Surgeon: Yetta Flock, MD;  Location: [facility];  Service: Gastroenterology;;  . TONSILLECTOMY    . TOOTH EXTRACTION  10/28/2011   Procedure: DENTAL RESTORATION/EXTRACTIONS;  Surgeon: Gae Bon, DDS;  Location: [facility] OR;  Service: Oral Surgery;;  . UPPER GASTROINTESTINAL ENDOSCOPY       OB History    Gravida  2   Para  1   Term  1   Preterm      AB  1   Living  2     SAB  0   TAB      Ectopic  1   Multiple  1   Live Births  2            Home Medications  Prior to Admission medications   Medication Sig Start Date End Date Taking? Authorizing Provider  albuterol (VENTOLIN HFA) 108 (90 Base) MCG/ACT inhaler Inhale 2 puffs into the lungs every 4 (four) hours as needed for wheezing or shortness of breath. 08/06/18  Yes Valentina Shaggy, MD  buPROPion (WELLBUTRIN XL) 300 MG 24 hr tablet Take 300 mg by mouth daily.  04/28/14  Yes [provider]  busPIRone (BUSPAR) 10 MG tablet Take 20 mg by mouth 2 (two) times a day. 07/22/18  Yes [provider]  Cholecalciferol (VITAMIN D3) 5000 units CAPS Take 10,000 Units by mouth daily.    Yes [provider]  citalopram (CELEXA) 40 MG tablet Take 40 mg by mouth at bedtime.    Yes [provider]  CRANBERRY PO Take 1 tablet by mouth daily.   Yes [provider]  Cyanocobalamin (VITAMIN B-12) 5000 MCG TBDP Take 5,000 mcg by mouth daily.   Yes [provider]  fluticasone (FLONASE) 50 MCG/ACT nasal spray Place 2 sprays into both nostrils daily as needed for allergies or rhinitis. 08/06/18  Yes Valentina Shaggy, MD  fluticasone (FLOVENT HFA) 110 MCG/ACT inhaler Inhale 2 puffs into the lungs 2 (two) times daily. 08/06/18   Yes Valentina Shaggy, MD  indapamide (LOZOL) 2.5 MG tablet Take 1 tablet (2.5 mg total) by mouth daily. 10/21/18  Yes Jearld Lesch A, DO  Methylsulfonylmethane (MSM) 1000 MG CAPS Take 1,000 mg by mouth daily.    Yes [provider]  montelukast (SINGULAIR) 10 MG tablet Take 1 tablet (10 mg total) by mouth at bedtime. 08/06/18  Yes Valentina Shaggy, MD  pantoprazole (PROTONIX) 40 MG tablet Take 1 tablet (40 mg total) by mouth 2 (two) times daily. 10/28/18  Yes Armbruster, Carlota Raspberry, MD  Prenatal Vit-Fe Fumarate-FA (MULTIVITAMIN-PRENATAL) 27-0.8 MG TABS tablet Take 1 tablet by mouth daily at 12 noon.   Yes [provider]  rOPINIRole (REQUIP) 3 MG tablet Take 1 tablet (3 mg total) by mouth at bedtime. 10/21/18  Yes Jearld Lesch A, DO  Turmeric 500 MG CAPS Take 500 mg by mouth daily.    Yes [provider]  vitamin E 400 UNIT capsule Take 400 Units by mouth daily.   Yes [provider]  cephALEXin (KEFLEX) 500 MG capsule Take 1 capsule (500 mg total) by mouth 4 (four) times daily. 11/04/18   Avaley Coop, Ozella Almond, PA-C  HYDROcodone-acetaminophen (NORCO) 5-325 MG tablet Take 1 tablet by mouth every 6 (six) hours as needed for moderate pain. 11/04/18   Jihaad Bruschi, Ozella Almond, PA-C    Family History Family History  Problem Relation Age of Onset  . Depression Mother   . Anxiety disorder Mother   . High blood pressure Mother   . Bipolar disorder Mother   . Eating disorder Mother   . Obesity Mother   . Hypertension Sister   . Allergic rhinitis Sister   . Colon polyps Maternal Grandmother        19s  . Diabetes Maternal Grandmother   . Anxiety disorder Maternal Grandmother   . COPD Maternal Grandmother   . Crohn's disease Maternal Aunt   . Cancer Maternal Grandfather        prostate  . HIV/AIDS Father   . Eating disorder Father   . Obesity Father   . Liver disease Neg Hx   . Angioedema Neg Hx   . Eczema Neg Hx   . Immunodeficiency Neg Hx   . Asthma Neg  Hx   . Urticaria Neg Hx   . Colon cancer Neg Hx   . Esophageal cancer Neg Hx   . Rectal cancer Neg Hx   . Stomach cancer Neg Hx     Social History Social History   Tobacco Use  . Smoking status: Former Smoker    Packs/day: 0.50    Years: 8.00    Pack years: 4.00    Types: Cigarettes    Quit date: 04/25/2011    Years since quitting: 7.5  . Smokeless tobacco: Never Used  Substance Use Topics  . Alcohol use: No  . Drug use: No     Allergies   Bee venom, Penicillins, Adhesive [tape], Latex, and Vancomycin   Review of Systems Review of Systems  HENT: Positive for sore throat.   Gastrointestinal: Positive for nausea and vomiting (1 episode post-tussive). Negative for abdominal pain, constipation and diarrhea.  Musculoskeletal: Positive for back pain.  Neurological: Positive for numbness. Negative for weakness.  All other systems reviewed and are negative.    Physical Exam Updated Vital Signs BP (!) 106/54   Pulse 78   Temp 97.7 F (36.5 C) (Oral)   Resp 15   SpO2 95%   Physical Exam Vitals signs and nursing note reviewed.  Constitutional:      General: She is not in acute distress.    Appearance: She is well-developed.  HENT:     Head: Normocephalic and atraumatic.     Mouth/Throat:     Pharynx: Oropharynx is clear. No oropharyngeal exudate or posterior oropharyngeal erythema.  Neck:     Musculoskeletal: Neck supple.  Cardiovascular:     Rate and Rhythm: Normal rate and regular rhythm.     Heart sounds: Normal heart sounds. No murmur.  Pulmonary:     Effort: Pulmonary effort is normal. No respiratory distress.     Breath sounds: Normal breath sounds. No wheezing, rhonchi or rales.  Abdominal:     General: There is no distension.     Palpations: Abdomen is soft.     Tenderness: There is no abdominal tenderness.  Musculoskeletal:     Comments: Tenderness to midline L-spine as well as left hip and left lumbar paraspinal musculature. Equal and intact  strength to LE's. Sensation intact to LLE but diminished compared to RLE. Intact distal pulses x4.   Skin:    General: Skin is warm and dry.  Neurological:     Mental Status: She is alert and oriented to person, place, and time.      ED Treatments / Results  Labs (all labs ordered are listed, but only abnormal results are displayed) Labs Reviewed  COMPREHENSIVE METABOLIC PANEL - Abnormal; Notable for the following components:      Result Value   Potassium 3.1 (*)    Albumin 3.4 (*)    All other components within normal limits  CBC WITH DIFFERENTIAL/PLATELET - Abnormal; Notable for the following components:   Hemoglobin 11.4 (*)    All other components within normal limits  URINALYSIS, ROUTINE W REFLEX MICROSCOPIC - Abnormal; Notable for the following components:   Color, Urine AMBER (*)    APPearance HAZY (*)    Hgb urine dipstick MODERATE (*)    Ketones, ur 5 (*)    Protein, ur 100 (*)    Leukocytes,Ua LARGE (*)    WBC, UA >50 (*)    Bacteria, UA MANY (*)    All other components within normal limits  URINE CULTURE    EKG  None  Radiology Mr Lumbar Spine Wo Contrast  Result Date: 11/04/2018 CLINICAL DATA:  Low back pain, incontinence and left leg numbness over the last month. EXAM: MRI LUMBAR SPINE WITHOUT CONTRAST TECHNIQUE: Multiplanar, multisequence MR imaging of the lumbar spine was performed. No intravenous contrast was administered. COMPARISON:  06/22/2018.  01/24/2018.  CT 11/03/2010 FINDINGS: Segmentation: 5 lumbar type vertebral bodies as numbered previously. Alignment:  3 mm anterolisthesis L5-S1. Vertebrae: Suspicion chronic pars defects at L5-S1. No primary vertebral body or bone lesion. Conus medullaris and cauda equina: Conus extends to the T12-L1 level. Conus and cauda equina appear normal. Paraspinal and other soft tissues: Cystic multi septated lesion of the right kidney measuring at least 7 cm in size. This could represent a multi septated cyst or a cystic  neoplasm. CT scan of the abdomen pelvis with contrast suggested. Disc levels: The lower thoracic region was not studied in the axial plane. There is a chronic central disc herniation at T12-L1 with upward migration. This indents the thecal sac but does not appear to cause neural compression. L1-2: Shallow disc protrusion with caudal down turning in the midline. This indents the thecal sac. Mild canal narrowing but no likely neural compression. L2-3: Disc herniation with caudal migration more towards the left. There is spinal stenosis with crowding of the nerve roots and potential for neural compression particularly on the left. L3-4: Minimal disc bulge.  Mild facet hypertrophy.  No stenosis. L4-5: Disc degeneration and bulging. Bilateral facet arthropathy. No central canal stenosis. Mild lateral recess and foraminal narrowing. L5-S1: Facet arthropathy and possible bilateral pars defects. 3 mm of anterolisthesis. Disc degeneration and bulging. No central canal stenosis. Bilateral foraminal stenosis could affect either L5 nerve. No appreciable change since the previous study. IMPRESSION: No change since the previous studies. L5-S1 anterolisthesis of 3 mm due to facet arthropathy and probably pars defects. Bilateral foraminal stenosis that could cause neural compression on either or both sides. L4-5 facet arthropathy with mild lateral recess and foraminal narrowing but no definite neural compression. L2-3 disc herniation more prominent towards the left caudal down turning. Stenosis that could cause neural compression particularly on the left. L1-2 disc protrusion with mild stenosis but no definite neural compression. T12-L1 central disc herniation with upward migration. This level was not studied in the axial plane. No visible neural compression on the sagittal imaging. Large primarily cystic but multi septated lesion of the right kidney measuring at least up to 6.8 cm. This could represent a cystic neoplasm. If this  has not been evaluated otherwise, CT scan of the abdomen and pelvis with and without contrast is suggested. Electronically Signed   By: Nelson Chimes M.D.   On: 11/04/2018 20:58    Procedures Procedures (including critical care time)  Medications Ordered in ED Medications  fentaNYL (SUBLIMAZE) injection 50 mcg (50 mcg Intravenous Given 11/04/18 1552)  ondansetron (ZOFRAN) injection 4 mg (4 mg Intravenous Given 11/04/18 1551)  cefTRIAXone (ROCEPHIN) 1 g in sodium chloride 0.9 % 100 mL IVPB (1 g Intravenous New Bag/Given 11/04/18 1911)  fentaNYL (SUBLIMAZE) injection 50 mcg (50 mcg Intravenous Given 11/04/18 2106)     Initial Impression / Assessment and Plan / ED Course  I have reviewed the triage vital signs and the nursing notes.  Pertinent labs & imaging results that were available during my care of the patient were reviewed by me and considered in my medical decision making (see chart for details).       Heather [name] is a  43 y.o. female who presents to ED for left-sided back pain with numbness to the left leg.  History of similar and been told to follow-up with neurosurgery as well as do physical therapy, but has not.  MRI does show pathology, but unchanged from previous when she was evaluated by neurosurgery in the ER.  Given no acute change, will have her follow-up with neurosurgery in the office in give her short course of pain medication.  She also did have notable UTI which could be contributing to her back pain as well.  We will treat and have her follow-up with PCP in a week for recheck.  Reasons to return to the emergency department were discussed and all questions were answered.  Patient discussed with Dr. Kathrynn Humble who agrees with treatment plan.   Final Clinical Impressions(s) / ED Diagnoses   Final diagnoses:  Acute cystitis with hematuria  Left low back pain, unspecified chronicity, unspecified whether sciatica present    ED Discharge Orders         Ordered     cephALEXin (KEFLEX) 500 MG capsule  4 times daily     11/04/18 2117    HYDROcodone-acetaminophen (NORCO) 5-325 MG tablet  Every 6 hours PRN     11/04/18 2117           Shukri Nistler, Ozella Almond, PA-C 11/04/18 2121    Varney Biles, MD 11/05/18 1431

## 2018-11-04 NOTE — ED Notes (Signed)
Patient transported to MRI 

## 2018-11-04 NOTE — ED Triage Notes (Signed)
Patient complains of intermittent left leg numbness x 1 month, states that the numbness from hip down. Also complains of actual pain to left hip. Also complains of headache

## 2018-11-06 LAB — URINE CULTURE: Culture: 80000 — AB

## 2018-11-07 ENCOUNTER — Telehealth: Payer: Self-pay | Admitting: Emergency Medicine

## 2018-11-07 NOTE — Telephone Encounter (Signed)
Post ED Visit - Positive Culture Follow-up  Culture report reviewed by antimicrobial stewardship pharmacist: [place] Team []  Elenor Quinones, Pharm.D. []  Heide Guile, Pharm.D., BCPS AQ-ID []  Parks Neptune, Pharm.D., BCPS []  Alycia Rossetti, Pharm.D., BCPS []  [place]ia [place], [place].D., BCPS, AAHIVP []  Legrand Como, Pharm.D., BCPS, AAHIVP []  Salome Arnt, PharmD, BCPS []  Johnnette Gourd, PharmD, BCPS [x]  Hughes Better, PharmD, BCPS []  Leeroy Cha, PharmD []  Laqueta Linden, PharmD, BCPS []  Albertina Parr, PharmD  [facility] Team []  Leodis Sias, PharmD []  Lindell Spar, PharmD []  Royetta Asal, PharmD []  Graylin Shiver, Rph []  Rema Fendt) Glennon Mac, PharmD []  Arlyn Dunning, PharmD []  Netta Cedars, PharmD []  Dia Sitter, PharmD []  Leone Haven, PharmD []  Gretta Arab, PharmD []  Theodis Shove, PharmD []  Peggyann Juba, PharmD []  Reuel Boom, PharmD   Positive urine culture Treated with Cephalexin, organism sensitive to the same and no further patient follow-up is required at this time.  Heather [name] 11/07/2018, 4:20 PM

## 2018-11-10 ENCOUNTER — Telehealth: Payer: Self-pay

## 2018-11-10 NOTE — Telephone Encounter (Signed)
Prior Auth for pantoprazole 40mg  bid sent to [employer]

## 2018-11-11 ENCOUNTER — Telehealth (INDEPENDENT_AMBULATORY_CARE_PROVIDER_SITE_OTHER): Payer: Medicaid Other | Admitting: Bariatrics

## 2018-11-11 ENCOUNTER — Encounter (INDEPENDENT_AMBULATORY_CARE_PROVIDER_SITE_OTHER): Payer: Self-pay | Admitting: Bariatrics

## 2018-11-11 ENCOUNTER — Other Ambulatory Visit: Payer: Self-pay

## 2018-11-11 DIAGNOSIS — E559 Vitamin D deficiency, unspecified: Secondary | ICD-10-CM

## 2018-11-11 DIAGNOSIS — Z6841 Body Mass Index (BMI) 40.0 and over, adult: Secondary | ICD-10-CM | POA: Diagnosis not present

## 2018-11-11 DIAGNOSIS — E8881 Metabolic syndrome: Secondary | ICD-10-CM | POA: Diagnosis not present

## 2018-11-11 NOTE — Progress Notes (Signed)
Office: [phone]  /  Fax: [phone] TeleHealth Visit:  Heather [name] has verbally consented to this TeleHealth visit today. The patient is located at home, the provider is located at the [employer] [facility] office. The participants in this visit include the listed provider and patient. The visit was conducted today via FaceTime.  HPI:   Chief Complaint: OBESITY Heather [name] is here to discuss her progress with her obesity treatment plan. She is on the Category 4 plan and is following her eating plan approximately 80% of the time. She states she is walking 30 minutes 2-3 times per week. Heather [name] states that her weight is the same. She reports drinking more water, however, is not getting adequate protein.  We were unable to weigh the patient today for this TeleHealth visit. She feels as if she has maintained her weight since her last visit. She has lost 0 lbs since starting treatment with [place].  Vitamin D deficiency Heather [name] has a diagnosis of Vitamin D deficiency. She is currently taking Vit D and denies nausea, vomiting or muscle weakness.  Insulin Resistance Heather [name] has a diagnosis of insulin resistance based on her elevated fasting insulin level >5. Although Heather [name]'s blood glucose readings are still under good control, insulin resistance puts her at greater risk of metabolic syndrome and diabetes. She is on no medication currently and continues to work on diet and exercise to decrease risk of diabetes.  ASSESSMENT AND PLAN:  Vitamin D deficiency  Insulin resistance  BMI 70 and over, adult [facility])  PLAN:  Vitamin D Deficiency Heather [name] was informed that low Vitamin D levels contributes to fatigue and are associated with obesity, breast, and colon cancer. She agrees to continue taking Vit D and will follow-up for routine testing of Vitamin D, at least 2-3 times per year. She was informed of the risk of over-replacement of Vitamin D and agrees to not increase her dose unless she  discusses this with [place] first. Heather [name] agrees to follow-up with our clinic in 2 weeks.  Insulin Resistance Heather [name] will continue to work on weight loss, exercise, and decreasing simple carbohydrates in her diet to help decrease the risk of diabetes. We dicussed metformin including benefits and risks. She was informed that eating too many simple carbohydrates or too many calories at one sitting increases the likelihood of GI side effects. Heather [name] was instructed to decrease carbohydrates, increase protein, and increase exercise. She will follow-up with [place] as directed to monitor her progress.  Obesity Heather [name] is currently in the action stage of change. As such, her goal is to continue with weight loss efforts. She has agreed to follow the Category 4 plan. Heather [name] will work on meal planning, intentional eating, and increasing her protein. She will also choose better snacks. Heather [name] has been instructed to continue her current exercise regimen (increase walking) for weight loss and overall health benefits. We discussed the following Behavioral Modification Strategies today: increasing lean protein intake, decreasing simple carbohydrates, increasing vegetables, increase H20 intake, decrease eating out, no skipping meals, work on meal planning and easy cooking plans, and keeping healthy foods in the home.   Heather [name] has agreed to follow-up with our clinic in 2 weeks. She was informed of the importance of frequent follow-up visits to maximize her success with intensive lifestyle modifications for her multiple health conditions.  ALLERGIES: Allergies  Allergen Reactions  . Bee Venom Shortness Of Breath  . Penicillins Anaphylaxis    Has patient had a PCN reaction causing immediate rash, facial/tongue/throat swelling,  SOB or lightheadedness with hypotension: No Has patient had a PCN reaction causing severe rash involving mucus membranes or skin necrosis: No Has patient had a PCN reaction that required  hospitalization No Has patient had a PCN reaction occurring within the last 10 years: No If all of the above answers are "NO", then may proceed with Cephalosporin use.'  REACTION: Angioedema  . Adhesive [Tape] Rash  . Latex Rash  . Vancomycin Other (See Comments)    Pt can tolerate Vancomycin but did cause Red-Man Syndrome.  Recommend to pre-medicate with Benadryl before doses administered.      MEDICATIONS: Current Outpatient Medications on File Prior to Visit  Medication Sig Dispense Refill  . albuterol (VENTOLIN HFA) 108 (90 Base) MCG/ACT inhaler Inhale 2 puffs into the lungs every 4 (four) hours as needed for wheezing or shortness of breath. 1 Inhaler 1  . buPROPion (WELLBUTRIN XL) 300 MG 24 hr tablet Take 300 mg by mouth daily.   3  . busPIRone (BUSPAR) 10 MG tablet Take 20 mg by mouth 2 (two) times a day.    . cephALEXin (KEFLEX) 500 MG capsule Take 1 capsule (500 mg total) by mouth 4 (four) times daily. 40 capsule 0  . Cholecalciferol (VITAMIN D3) 5000 units CAPS Take 10,000 Units by mouth daily.     . citalopram (CELEXA) 40 MG tablet Take 40 mg by mouth at bedtime.     Marland Kitchen CRANBERRY PO Take 1 tablet by mouth daily.    . Cyanocobalamin (VITAMIN B-12) 5000 MCG TBDP Take 5,000 mcg by mouth daily.    . fluticasone (FLONASE) 50 MCG/ACT nasal spray Place 2 sprays into both nostrils daily as needed for allergies or rhinitis. 16 g 5  . fluticasone (FLOVENT HFA) 110 MCG/ACT inhaler Inhale 2 puffs into the lungs 2 (two) times daily. 1 Inhaler 5  . HYDROcodone-acetaminophen (NORCO) 5-325 MG tablet Take 1 tablet by mouth every 6 (six) hours as needed for moderate pain. 10 tablet 0  . indapamide (LOZOL) 2.5 MG tablet Take 1 tablet (2.5 mg total) by mouth daily. 30 tablet 0  . Methylsulfonylmethane (MSM) 1000 MG CAPS Take 1,000 mg by mouth daily.     . montelukast (SINGULAIR) 10 MG tablet Take 1 tablet (10 mg total) by mouth at bedtime. 30 tablet 5  . pantoprazole (PROTONIX) 40 MG tablet Take 1  tablet (40 mg total) by mouth 2 (two) times daily. 180 tablet 1  . Prenatal Vit-Fe Fumarate-FA (MULTIVITAMIN-PRENATAL) 27-0.8 MG TABS tablet Take 1 tablet by mouth daily at 12 noon.    Marland Kitchen rOPINIRole (REQUIP) 3 MG tablet Take 1 tablet (3 mg total) by mouth at bedtime. 30 tablet 0  . Turmeric 500 MG CAPS Take 500 mg by mouth daily.     . vitamin E 400 UNIT capsule Take 400 Units by mouth daily.     Current Facility-Administered Medications on File Prior to Visit  Medication Dose Route Frequency Provider Last Rate Last Dose  . [COMPLETED] ipratropium (ATROVENT) nebulizer solution 0.5 mg  0.5 mg Nebulization Once Gean Quint, MD   0.5 mg at 03/28/15 1725    PAST MEDICAL HISTORY: Past Medical History:  Diagnosis Date  . Allergy   . Amenorrhea   . Anemia    post partum   . Anxiety   . Anxiety   . Arthritis   . Asthma   . Back pain   . Constipation   . COPD (chronic obstructive pulmonary disease) ([place])   . Delivery  with history of C-section   . Depression   . Depression   . Diabetes mellitus without complication [facility])    patient denies but states she has hyperglycemia-diet controlled  . Dysmenorrhea   . Dysrhythmia    DR Johnsie Cancel    . Ectopic pregnancy 2013  . Edema, lower extremity   . Eosinophilic esophagitis    Diagnosed at [facility] 06/16/2013, untreated  . Gallbladder problem   . GERD (gastroesophageal reflux disease)    HEARTBURN   TUMS  . Hard to intubate 11/07/2015  . High cholesterol   . IBS (irritable bowel syndrome)   . Leukocytosis 07/28/2008   Qualifier: Diagnosis of  By: Jonna Munro MD, Roderic Scarce    . Morbid obesity ([place])   . Neuromuscular disorder ([facility])    RESTLESS LEG   . Obesity   . Schizoaffective disorder, bipolar type ([place])   . Sepsis ([place]) 11/11/2014  . Shortness of breath    WITH EXERTION   . Sleep apnea    CPAP- in process of restarting     PAST SURGICAL HISTORY: Past Surgical History:  Procedure Laterality Date  . BIOPSY  08/13/2018   Procedure:  BIOPSY;  Surgeon: Yetta Flock, MD;  Location: [facility];  Service: Gastroenterology;;  . CESAREAN SECTION MULTI-GESTATIONAL N/A 02/03/2017   Procedure: CESAREAN SECTION MULTI-GESTATIONAL;  Surgeon: Jonnie Kind, MD;  Location: [facility];  Service: Obstetrics;  Laterality: N/A;  . CHOLECYSTECTOMY    . COLONOSCOPY WITH PROPOFOL N/A 08/13/2018   Procedure: COLONOSCOPY WITH PROPOFOL;  Surgeon: Yetta Flock, MD;  Location: [facility];  Service: Gastroenterology;  Laterality: N/A;  . DENTAL SURGERY    . ESOPHAGOGASTRODUODENOSCOPY  May 2007   Dr. Gala Romney: Normal esophagus, stomach, D1, D2  . ESOPHAGOGASTRODUODENOSCOPY  06/16/2013   Dr. Carlton Adam, eosinophilic esophagitis, reactive gastropathy, no esophageal dilation  . ESOPHAGOGASTRODUODENOSCOPY (EGD) WITH PROPOFOL N/A 08/13/2018   Procedure: ESOPHAGOGASTRODUODENOSCOPY (EGD) WITH PROPOFOL;  Surgeon: Yetta Flock, MD;  Location: [facility];  Service: Gastroenterology;  Laterality: N/A;  . POLYPECTOMY  08/13/2018   Procedure: POLYPECTOMY;  Surgeon: Yetta Flock, MD;  Location: [facility];  Service: Gastroenterology;;  . TONSILLECTOMY    . TOOTH EXTRACTION  10/28/2011   Procedure: DENTAL RESTORATION/EXTRACTIONS;  Surgeon: Gae Bon, DDS;  Location: [facility];  Service: Oral Surgery;;  . UPPER GASTROINTESTINAL ENDOSCOPY      SOCIAL HISTORY: Social History   Tobacco Use  . Smoking status: Former Smoker    Packs/day: 0.50    Years: 8.00    Pack years: 4.00    Types: Cigarettes    Quit date: 04/25/2011    Years since quitting: 7.5  . Smokeless tobacco: Never Used  Substance Use Topics  . Alcohol use: No  . Drug use: No    FAMILY HISTORY: Family History  Problem Relation Age of Onset  . Depression Mother   . Anxiety disorder Mother   . High blood pressure Mother   . Bipolar disorder Mother   . Eating disorder Mother   . Obesity Mother   . Hypertension Sister   . Allergic rhinitis Sister   .  Colon polyps Maternal Grandmother        4s  . Diabetes Maternal Grandmother   . Anxiety disorder Maternal Grandmother   . COPD Maternal Grandmother   . Crohn's disease Maternal Aunt   . Cancer Maternal Grandfather        prostate  . HIV/AIDS Father   . Eating disorder Father   .  Obesity Father   . Liver disease Neg Hx   . Angioedema Neg Hx   . Eczema Neg Hx   . Immunodeficiency Neg Hx   . Asthma Neg Hx   . Urticaria Neg Hx   . Colon cancer Neg Hx   . Esophageal cancer Neg Hx   . Rectal cancer Neg Hx   . Stomach cancer Neg Hx    ROS: Review of Systems  Gastrointestinal: Negative for nausea and vomiting.  Musculoskeletal:       Negative for muscle weakness.   PHYSICAL EXAM: Pt in no acute distress  RECENT LABS AND TESTS: BMET    Component Value Date/Time   NA 140 11/04/2018 1548   NA 140 06/11/2018 1301   K 3.1 (L) 11/04/2018 1548   CL 103 11/04/2018 1548   CO2 27 11/04/2018 1548   GLUCOSE 85 11/04/2018 1548   BUN 14 11/04/2018 1548   BUN 16 06/11/2018 1301   CREATININE 0.80 11/04/2018 1548   CREATININE 0.70 07/14/2014 1451   CALCIUM 9.3 11/04/2018 1548   GFRNONAA >60 11/04/2018 1548   GFRAA >60 11/04/2018 1548   Lab Results  Component Value Date   HGBA1C 5.3 09/24/2018   HGBA1C 5.5 06/11/2018   HGBA1C 5.3 01/13/2017   HGBA1C 5.1 11/28/2016   HGBA1C 5.5 12/06/2014   Lab Results  Component Value Date   INSULIN 36.0 (H) 06/11/2018   CBC    Component Value Date/Time   WBC 10.0 11/04/2018 1548   RBC 4.33 11/04/2018 1548   HGB 11.4 (L) 11/04/2018 1548   HGB 11.5 06/11/2018 1301   HCT 37.8 11/04/2018 1548   HCT 36.7 06/11/2018 1301   PLT 358 11/04/2018 1548   PLT 383 (H) 01/20/2017 1137   MCV 87.3 11/04/2018 1548   MCV 81 06/11/2018 1301   MCH 26.3 11/04/2018 1548   MCHC 30.2 11/04/2018 1548   RDW 14.6 11/04/2018 1548   RDW 14.3 06/11/2018 1301   LYMPHSABS 1.9 11/04/2018 1548   LYMPHSABS 1.8 06/11/2018 1301   MONOABS 1.0 11/04/2018 1548    EOSABS 0.4 11/04/2018 1548   EOSABS 0.3 06/11/2018 1301   BASOSABS 0.0 11/04/2018 1548   BASOSABS 0.0 06/11/2018 1301   Iron/TIBC/Ferritin/ %Sat    Component Value Date/Time   FERRITIN 43 06/10/2006 2218   Lipid Panel     Component Value Date/Time   CHOL 115 06/11/2018 1301   TRIG 120 06/11/2018 1301   HDL 43 06/11/2018 1301   CHOLHDL 3.9 Ratio 06/30/2008 2123   VLDL 52 (H) 06/30/2008 2123   LDLCALC 48 06/11/2018 1301   Hepatic Function Panel     Component Value Date/Time   PROT 7.8 11/04/2018 1548   PROT 7.1 06/11/2018 1301   ALBUMIN 3.4 (L) 11/04/2018 1548   ALBUMIN 3.9 06/11/2018 1301   AST 20 11/04/2018 1548   ALT 19 11/04/2018 1548   ALKPHOS 55 11/04/2018 1548   BILITOT 0.6 11/04/2018 1548   BILITOT 0.2 06/11/2018 1301   BILIDIR <0.1 10/23/2010 2043   IBILI NOT CALCULATED 10/23/2010 2043      Component Value Date/Time   TSH 2.220 06/11/2018 1301   TSH 1.74 11/28/2016 1439   TSH 2.088 11/11/2014 1241   TSH 2.476 11/17/2013 1515   Results for Kosar, Fama LYNN (MRN [id number]) as of 11/11/2018 14:00  Ref. Range 09/24/2018 16:59  Vitamin D, 25-Hydroxy Latest Ref Range: 30.0 - 100.0 ng/mL 33.1    I, Michaelene Song, am acting as Location manager for [employer], DO  I have reviewed the above documentation for accuracy and completeness, and I agree with the above. -Jearld Lesch, DO

## 2018-11-25 ENCOUNTER — Ambulatory Visit (INDEPENDENT_AMBULATORY_CARE_PROVIDER_SITE_OTHER): Payer: Medicaid Other | Admitting: Bariatrics

## 2018-12-10 ENCOUNTER — Ambulatory Visit: Payer: Medicare HMO | Admitting: Allergy & Immunology

## 2018-12-22 ENCOUNTER — Encounter (HOSPITAL_COMMUNITY): Payer: Self-pay

## 2018-12-22 ENCOUNTER — Emergency Department (HOSPITAL_COMMUNITY)
Admission: EM | Admit: 2018-12-22 | Discharge: 2018-12-22 | Disposition: A | Discharge status: Home / Self Care | Payer: Medicaid Other | Attending: Emergency Medicine | Admitting: Emergency Medicine

## 2018-12-22 DIAGNOSIS — J45909 Unspecified asthma, uncomplicated: Secondary | ICD-10-CM | POA: Insufficient documentation

## 2018-12-22 DIAGNOSIS — E119 Type 2 diabetes mellitus without complications: Secondary | ICD-10-CM | POA: Diagnosis not present

## 2018-12-22 DIAGNOSIS — J449 Chronic obstructive pulmonary disease, unspecified: Secondary | ICD-10-CM | POA: Diagnosis not present

## 2018-12-22 DIAGNOSIS — R21 Rash and other nonspecific skin eruption: Secondary | ICD-10-CM | POA: Insufficient documentation

## 2018-12-22 DIAGNOSIS — Z79899 Other long term (current) drug therapy: Secondary | ICD-10-CM | POA: Diagnosis not present

## 2018-12-22 DIAGNOSIS — I10 Essential (primary) hypertension: Secondary | ICD-10-CM | POA: Insufficient documentation

## 2018-12-22 DIAGNOSIS — Z87891 Personal history of nicotine dependence: Secondary | ICD-10-CM | POA: Insufficient documentation

## 2018-12-22 DIAGNOSIS — W57XXXA Bitten or stung by nonvenomous insect and other nonvenomous arthropods, initial encounter: Secondary | ICD-10-CM

## 2018-12-22 MED ORDER — DOXYCYCLINE HYCLATE 100 MG PO CAPS: 100.0000 mg | ORAL_CAPSULE | Freq: Two times a day (BID) | ORAL | 0 refills | Status: AC

## 2018-12-22 NOTE — ED Provider Notes (Signed)
[facility] DEPT Provider Note   CSN: [id number] Arrival date & time: 12/22/18  1348     History   Chief Complaint Chief Complaint  Patient presents with  . Rash    BED BUGZ    HPI Heather [name] is a 43 y.o. female.  Presents the ER with concern for bedbug infestation.  Patient states that she has had a rash intermittently over the past couple months, was told previously that she has had bedbugs.  About a month ago she had a pest exterminator come to her house to do a treatment and tried going through the process of heat treatments of all her clothing.  Patient concerned that she has noted a little bit of increased redness on her right lower leg in addition to her regular bedbug rash.  She denies any fever, difficulty breathing, chest pain, abdominal pain, fatigue or nausea.     HPI  Past Medical History:  Diagnosis Date  . Allergy   . Amenorrhea   . Anemia    post partum   . Anxiety   . Anxiety   . Arthritis   . Asthma   . Back pain   . Constipation   . COPD (chronic obstructive pulmonary disease) ([place])   . Delivery with history of C-section   . Depression   . Depression   . Diabetes mellitus without complication [facility])    patient denies but states she has hyperglycemia-diet controlled  . Dysmenorrhea   . Dysrhythmia    DR Johnsie Cancel    . Ectopic pregnancy 2013  . Edema, lower extremity   . Eosinophilic esophagitis    Diagnosed at [facility]/16/2013, untreated  . Gallbladder problem   . GERD (gastroesophageal reflux disease)    HEARTBURN   TUMS  . Hard to intubate 11/07/2015  . High cholesterol   . IBS (irritable bowel syndrome)   . Leukocytosis 07/28/2008   Qualifier: Diagnosis of  By: Jonna Munro MD, Roderic Scarce    . Morbid obesity ([place])   . Neuromuscular disorder (H[facility])    RESTLESS LEG   . Obesity   . Schizoaffective disorder, bipolar type ([place])   . Sepsis ([place]) 11/11/2014  . Shortness of breath    WITH EXERTION   . Sleep apnea     CPAP- in process of restarting     Patient Active Problem List   Diagnosis Date Noted  . Blood in stool   . Gastritis and gastroduodenitis   . Benign neoplasm of sigmoid colon   . Difficult intubation 08/10/2018  . Class 3 severe obesity with serious comorbidity and body mass index (BMI) greater than or equal to 70 in adult ([place]) 06/16/2018  . Nexplanon insertion 03/27/2017  . Postpartum hypertension 03/05/2017  . Status post primary low transverse cesarean section 02/03/2017  . H/O pre-eclampsia 01/27/2017  . Gestational htn w/o significant proteinuria, third trimester 01/20/2017  . Mild persistent asthma without complication [id number]  . Perennial allergic rhinitis 11/05/2016  . Mild persistent asthma with acute exacerbation 11/05/2016  . Excess weight gain in pregnancy, second trimester 10/30/2016  . Anxiety and depression 11/07/2015  . Hard to intubate 11/07/2015  . Hypoglycemia 11/07/2015  . OSA (obstructive sleep apnea) 11/07/2015  . DM type 2 (diabetes mellitus, type 2) ([place]) 12/07/2014  . Panniculitis 12/06/2014  . Cellulitis, abdominal wall 11/11/2014  . Abdominal pain 07/14/2014  . Loose stools 07/14/2014  . Melena 01/27/2014  . Eosinophilic esophagitis [id number]  . Change in bowel habits 04/28/2013  .  Esophageal dysphagia 04/28/2013  . Insomnia due to mental disorder(327.02) 08/08/2011  . RLS (restless legs syndrome) 08/08/2011  . Adjustment disorder with depressed mood 08/06/2011  . Schizoaffective disorder, bipolar type ([place]) 08/02/2011    Class: Acute  . PALPITATIONS, OCCASIONAL 11/01/2009  . Bipolar disorder ([place]) 10/19/2008  . DISORDER, TOBACCO USE 08/25/2008  . Leukocytosis 07/28/2008  . ALLERGIC RHINITIS, SEASONAL 08/20/2007  . DYSMETABOLIC SYNDROME [id number]  . Morbid obesity ([place]) 05/13/2006  . EXTERNAL HEMORRHOIDS 05/13/2006  . HYPERLIPIDEMIA 05/12/2006  . Essential hypertension 05/12/2006  . ASTHMA 05/12/2006  . GERD (gastroesophageal reflux  disease) 05/12/2006  . OSTEOARTHRITIS 05/12/2006    Past Surgical History:  Procedure Laterality Date  . BIOPSY  08/13/2018   Procedure: BIOPSY;  Surgeon: Yetta Flock, MD;  Location: [facility];  Service: Gastroenterology;;  . CESAREAN SECTION MULTI-GESTATIONAL N/A 02/03/2017   Procedure: CESAREAN SECTION MULTI-GESTATIONAL;  Surgeon: Jonnie Kind, MD;  Location: [facility];  Service: Obstetrics;  Laterality: N/A;  . CHOLECYSTECTOMY    . COLONOSCOPY WITH PROPOFOL N/A 08/13/2018   Procedure: COLONOSCOPY WITH PROPOFOL;  Surgeon: Yetta Flock, MD;  Location: [facility];  Service: Gastroenterology;  Laterality: N/A;  . DENTAL SURGERY    . ESOPHAGOGASTRODUODENOSCOPY  May 2007   Dr. Gala Romney: Normal esophagus, stomach, D1, D2  . ESOPHAGOGASTRODUODENOSCOPY  06/16/2013   Dr. Carlton Adam, eosinophilic esophagitis, reactive gastropathy, no esophageal dilation  . ESOPHAGOGASTRODUODENOSCOPY (EGD) WITH PROPOFOL N/A 08/13/2018   Procedure: ESOPHAGOGASTRODUODENOSCOPY (EGD) WITH PROPOFOL;  Surgeon: Yetta Flock, MD;  Location: [facility];  Service: Gastroenterology;  Laterality: N/A;  . POLYPECTOMY  08/13/2018   Procedure: POLYPECTOMY;  Surgeon: Yetta Flock, MD;  Location: [facility];  Service: Gastroenterology;;  . TONSILLECTOMY    . TOOTH EXTRACTION  10/28/2011   Procedure: DENTAL RESTORATION/EXTRACTIONS;  Surgeon: Gae Bon, DDS;  Location: [facility] OR;  Service: Oral Surgery;;  . UPPER GASTROINTESTINAL ENDOSCOPY       OB History    Gravida  2   Para  1   Term  1   Preterm      AB  1   Living  2     SAB  0   TAB      Ectopic  1   Multiple  1   Live Births  2            Home Medications    Prior to Admission medications   Medication Sig Start Date End Date Taking? Authorizing Provider  albuterol (VENTOLIN HFA) 108 (90 Base) MCG/ACT inhaler Inhale 2 puffs into the lungs every 4 (four) hours as needed for wheezing or shortness of  breath. 08/06/18   Valentina Shaggy, MD  buPROPion (WELLBUTRIN XL) 300 MG 24 hr tablet Take 300 mg by mouth daily.  04/28/14   [provider]  busPIRone (BUSPAR) 10 MG tablet Take 20 mg by mouth 2 (two) times a day. 07/22/18   [provider]  cephALEXin (KEFLEX) 500 MG capsule Take 1 capsule (500 mg total) by mouth 4 (four) times daily. 11/04/18   Ward, Ozella Almond, PA-C  Cholecalciferol (VITAMIN D3) 5000 units CAPS Take 10,000 Units by mouth daily.     [provider]  citalopram (CELEXA) 40 MG tablet Take 40 mg by mouth at bedtime.     [provider]  CRANBERRY PO Take 1 tablet by mouth daily.    [provider]  Cyanocobalamin (VITAMIN B-12) 5000 MCG TBDP Take 5,000 mcg by mouth daily.  [provider]  doxycycline (VIBRAMYCIN) 100 MG capsule Take 1 capsule (100 mg total) by mouth 2 (two) times daily for 7 days. 12/22/18 12/29/18  Lucrezia Starch, MD  fluticasone (FLONASE) 50 MCG/ACT nasal spray Place 2 sprays into both nostrils daily as needed for allergies or rhinitis. 08/06/18   Valentina Shaggy, MD  fluticasone (FLOVENT HFA) 110 MCG/ACT inhaler Inhale 2 puffs into the lungs 2 (two) times daily. 08/06/18   Valentina Shaggy, MD  HYDROcodone-acetaminophen (NORCO) 5-325 MG tablet Take 1 tablet by mouth every 6 (six) hours as needed for moderate pain. 11/04/18   Ward, Ozella Almond, PA-C  indapamide (LOZOL) 2.5 MG tablet Take 1 tablet (2.5 mg total) by mouth daily. 10/21/18   Jearld Lesch A, DO  Methylsulfonylmethane (MSM) 1000 MG CAPS Take 1,000 mg by mouth daily.     [provider]  montelukast (SINGULAIR) 10 MG tablet Take 1 tablet (10 mg total) by mouth at bedtime. 08/06/18   Valentina Shaggy, MD  pantoprazole (PROTONIX) 40 MG tablet Take 1 tablet (40 mg total) by mouth 2 (two) times daily. 10/28/18   Armbruster, Carlota Raspberry, MD  Prenatal Vit-Fe Fumarate-FA (MULTIVITAMIN-PRENATAL) 27-0.8 MG TABS tablet Take 1 tablet  by mouth daily at 12 noon.    [provider]  rOPINIRole (REQUIP) 3 MG tablet Take 1 tablet (3 mg total) by mouth at bedtime. 10/21/18   Jearld Lesch A, DO  Turmeric 500 MG CAPS Take 500 mg by mouth daily.     [provider]  vitamin E 400 UNIT capsule Take 400 Units by mouth daily.    [provider]    Family History Family History  Problem Relation Age of Onset  . Depression Mother   . Anxiety disorder Mother   . High blood pressure Mother   . Bipolar disorder Mother   . Eating disorder Mother   . Obesity Mother   . Hypertension Sister   . Allergic rhinitis Sister   . Colon polyps Maternal Grandmother        50s  . Diabetes Maternal Grandmother   . Anxiety disorder Maternal Grandmother   . COPD Maternal Grandmother   . Crohn's disease Maternal Aunt   . Cancer Maternal Grandfather        prostate  . HIV/AIDS Father   . Eating disorder Father   . Obesity Father   . Liver disease Neg Hx   . Angioedema Neg Hx   . Eczema Neg Hx   . Immunodeficiency Neg Hx   . Asthma Neg Hx   . Urticaria Neg Hx   . Colon cancer Neg Hx   . Esophageal cancer Neg Hx   . Rectal cancer Neg Hx   . Stomach cancer Neg Hx     Social History Social History   Tobacco Use  . Smoking status: Former Smoker    Packs/day: 0.50    Years: 8.00    Pack years: 4.00    Types: Cigarettes    Quit date: 04/25/2011    Years since quitting: 7.6  . Smokeless tobacco: Never Used  Substance Use Topics  . Alcohol use: No  . Drug use: No     Allergies   Bee venom, Penicillins, Adhesive [tape], Latex, and Vancomycin   Review of Systems Review of Systems  Constitutional: Negative for chills and fever.  HENT: Negative for ear pain and sore throat.   Eyes: Negative for pain and visual disturbance.  Respiratory: Negative for cough and  shortness of breath.   Cardiovascular: Negative for chest pain and palpitations.  Gastrointestinal: Negative for abdominal pain and vomiting.   Genitourinary: Negative for dysuria and hematuria.  Musculoskeletal: Negative for arthralgias and back pain.  Skin: Positive for rash. Negative for color change.  Neurological: Negative for seizures and syncope.  All other systems reviewed and are negative.    Physical Exam Updated Vital Signs BP (!) 157/81 (BP Location: Right Wrist)   Pulse 85   Temp 97.7 F (36.5 C) (Oral)   Resp 18   SpO2 99%   Physical Exam Vitals signs and nursing note reviewed.  Constitutional:      General: She is not in acute distress.    Appearance: She is well-developed.  HENT:     Head: Normocephalic and atraumatic.  Eyes:     Conjunctiva/sclera: Conjunctivae normal.  Neck:     Musculoskeletal: Neck supple.  Cardiovascular:     Rate and Rhythm: Normal rate and regular rhythm.     Heart sounds: No murmur.  Pulmonary:     Effort: Pulmonary effort is normal. No respiratory distress.     Breath sounds: Normal breath sounds.  Abdominal:     Palpations: Abdomen is soft.     Tenderness: There is no abdominal tenderness.  Skin:    Capillary Refill: Capillary refill takes less than 2 seconds.     Comments: Patient has multiple erythematous papules that tend to be an linear pattern, worse over hands, feet and ankles but extend along both upper extremities, both lower extremities, some across the chest and lower abdomen, no face involvement, mild blanchable erythema over right lower leg and left lower leg, no induration noted  Neurological:     General: No focal deficit present.     Mental Status: She is alert.      ED Treatments / Results  Labs (all labs ordered are listed, but only abnormal results are displayed) Labs Reviewed - No data to display  EKG None  Radiology No results found.  Procedures Procedures (including critical care time)  Medications Ordered in ED Medications - No data to display   Initial Impression / Assessment and Plan / ED Course  I have reviewed the triage  vital signs and the nursing notes.  Pertinent labs & imaging results that were available during my care of the patient were reviewed by me and considered in my medical decision making (see chart for details).       43 year old lady presented to ER with rash.  I have very high clinical suspicion for bedbugs.  I reviewed bedbug treatment, need for thorough decontamination in the home and professional pest extermination.  Patient is concerned that she may be developing cellulitis, there is a mild amount of erythema on both her lower legs, this may be chronic but may be signs of early cellulitis, not particularly warm to touch.  Will cover with course of oral antibiotics.  Recommended recheck with her primary doctor.    After the discussed management above, the patient was determined to be safe for discharge.  The patient was in agreement with this plan and all questions regarding their care were answered.  ED return precautions were discussed and the patient will return to the ED with any significant worsening of condition.    Final Clinical Impressions(s) / ED Diagnoses   Final diagnoses:  Bedbug bite, initial encounter    ED Discharge Orders         Ordered    doxycycline (  VIBRAMYCIN) 100 MG capsule  2 times daily     12/22/18 1541           Lucrezia Starch, MD 12/22/18 1728

## 2018-12-22 NOTE — ED Triage Notes (Signed)
Patient arrived via [facility] from home.   C/O rash that has spread from her abdomen to her legs.    Patient reports going to the MD multiple times and given creams. Patient states none of them are helping.    PER EMS PATIENT HAS BED BUGS ALL OVER HER.    A/Ox4 Ambulatory per ems.

## 2018-12-22 NOTE — Discharge Instructions (Addendum)
Please see the attached instructions regarding next steps for your bedbug infestation.  Strongly recommend reaching out to the professional past management company that you had used previously and have them come back to the house for an additional treatment.  You will need heat treatments on all washable items in your house.  Recommend calling your primary doctor for recheck and assistance with continued management of this issue.  Recommend taking the antibiotic for possible early cellulitis.  If you develop worsening redness, fever, swelling or other new concerning symptom please return to ER for reassessment.

## 2018-12-22 NOTE — ED Triage Notes (Signed)
Patient c/o rash X16months.   Patient has beg bugz per ems

## 2018-12-23 ENCOUNTER — Telehealth: Payer: Self-pay

## 2018-12-23 NOTE — Telephone Encounter (Signed)
Patient has upcoming skin testing appointment for 12-25-2018. Patient was seen 11-21-2018 in ED for  Bedbug bites. She was informed to follow up with her PCP in regards to this issue per ED note. I spoke with Beth and per her recommendation I left a message for patient to call back to reschedule. Patient needs to have clear skin with this type appointment.

## 2018-12-23 NOTE — Telephone Encounter (Signed)
Patient returned call. She has cancelled the up coming appointment. She will call back to reschedule.

## 2018-12-25 ENCOUNTER — Ambulatory Visit: Payer: Medicaid Other | Admitting: Family Medicine

## 2019-01-07 ENCOUNTER — Ambulatory Visit: Payer: Medicare HMO | Admitting: Allergy & Immunology

## 2019-01-08 ENCOUNTER — Other Ambulatory Visit: Payer: Self-pay | Admitting: Allergy & Immunology

## 2019-02-03 ENCOUNTER — Other Ambulatory Visit: Payer: Self-pay | Admitting: Allergy & Immunology

## 2019-03-01 ENCOUNTER — Other Ambulatory Visit: Payer: Self-pay | Admitting: Allergy & Immunology

## 2019-03-02 ENCOUNTER — Other Ambulatory Visit: Payer: Self-pay | Admitting: Allergy & Immunology

## 2019-03-17 ENCOUNTER — Encounter (HOSPITAL_COMMUNITY): Payer: Self-pay

## 2019-03-17 ENCOUNTER — Other Ambulatory Visit: Payer: Self-pay

## 2019-03-17 ENCOUNTER — Emergency Department (HOSPITAL_COMMUNITY)
Admission: EM | Admit: 2019-03-17 | Discharge: 2019-03-17 | Disposition: A | Discharge status: Home / Self Care | Payer: Commercial Managed Care - HMO | Attending: Emergency Medicine | Admitting: Emergency Medicine

## 2019-03-17 DIAGNOSIS — Z79899 Other long term (current) drug therapy: Secondary | ICD-10-CM | POA: Diagnosis not present

## 2019-03-17 DIAGNOSIS — Z87891 Personal history of nicotine dependence: Secondary | ICD-10-CM | POA: Insufficient documentation

## 2019-03-17 DIAGNOSIS — Z20828 Contact with and (suspected) exposure to other viral communicable diseases: Secondary | ICD-10-CM | POA: Insufficient documentation

## 2019-03-17 DIAGNOSIS — M5432 Sciatica, left side: Secondary | ICD-10-CM

## 2019-03-17 DIAGNOSIS — Z91040 Latex allergy status: Secondary | ICD-10-CM | POA: Diagnosis not present

## 2019-03-17 DIAGNOSIS — J45909 Unspecified asthma, uncomplicated: Secondary | ICD-10-CM | POA: Diagnosis not present

## 2019-03-17 DIAGNOSIS — E119 Type 2 diabetes mellitus without complications: Secondary | ICD-10-CM | POA: Diagnosis not present

## 2019-03-17 DIAGNOSIS — N3090 Cystitis, unspecified without hematuria: Secondary | ICD-10-CM | POA: Insufficient documentation

## 2019-03-17 DIAGNOSIS — J449 Chronic obstructive pulmonary disease, unspecified: Secondary | ICD-10-CM | POA: Insufficient documentation

## 2019-03-17 DIAGNOSIS — I10 Essential (primary) hypertension: Secondary | ICD-10-CM | POA: Insufficient documentation

## 2019-03-17 DIAGNOSIS — M79605 Pain in left leg: Secondary | ICD-10-CM | POA: Diagnosis present

## 2019-03-17 LAB — URINALYSIS, ROUTINE W REFLEX MICROSCOPIC
Bilirubin Urine: NEGATIVE
Glucose, UA: NEGATIVE mg/dL
Ketones, ur: NEGATIVE mg/dL
Nitrite: NEGATIVE
Protein, ur: 30 mg/dL — AB
Specific Gravity, Urine: 1.011 (ref 1.005–1.030)
WBC, UA: >50 WBC/hpf — ABNORMAL HIGH (ref 0–5)
pH: 7 (ref 5.0–8.0)

## 2019-03-17 LAB — POC SARS CORONAVIRUS 2 AG -  ED: SARS Coronavirus 2 Ag: NEGATIVE

## 2019-03-17 MED ORDER — KETOROLAC TROMETHAMINE 15 MG/ML IJ SOLN: 15.0000 mg | Freq: Once | INTRAMUSCULAR | Status: DC

## 2019-03-17 MED ORDER — CEPHALEXIN 500 MG PO CAPS: 500.0000 mg | ORAL_CAPSULE | Freq: Two times a day (BID) | ORAL | 0 refills | Status: AC

## 2019-03-17 MED ORDER — MELOXICAM 7.5 MG PO TABS
7.5000 mg | ORAL_TABLET | Freq: Every day | ORAL | Status: DC
  Administered 2019-03-17: 7.5 mg via ORAL
  Filled 2019-03-17 (×2): qty 1

## 2019-03-17 MED ORDER — MELOXICAM 7.5 MG PO TABS: 7.5000 mg | ORAL_TABLET | Freq: Every day | ORAL | 0 refills | Status: AC

## 2019-03-17 MED ORDER — OXYCODONE-ACETAMINOPHEN 5-325 MG PO TABS
1.0000 | ORAL_TABLET | Freq: Once | ORAL | Status: AC
  Administered 2019-03-17: 1 via ORAL
  Filled 2019-03-17: qty 1

## 2019-03-17 MED ORDER — CEPHALEXIN 250 MG PO CAPS
500.0000 mg | ORAL_CAPSULE | Freq: Once | ORAL | Status: AC
  Administered 2019-03-17: 500 mg via ORAL
  Filled 2019-03-17: qty 2

## 2019-03-17 NOTE — ED Triage Notes (Signed)
Pt arrives POV for eval of L sided leg numbness and saddle numbness. Pt reports new urinary incontinence as well. PA Martinique Robinson made aware of pt's complaints for concern for cauda equina. Per PA Quentin Cornwall, no MRI needed from triage based on 3 recent MRIs for same.

## 2019-03-17 NOTE — ED Notes (Signed)
PT ambulated to bathroom with RN as standby.  Pt was not incontinent.

## 2019-03-17 NOTE — ED Notes (Signed)
PT given hot packs for L hip pain.

## 2019-03-17 NOTE — Discharge Instructions (Signed)
Please follow-up in the spine clinic as your schedule tomorrow.  You were diagnosed with a urinary tract infection.  This are important that you complete the full dose of antibiotics prescribed to you.

## 2019-03-17 NOTE — ED Provider Notes (Signed)
[facility] EMERGENCY DEPARTMENT Provider Note   CSN: [id number] Arrival date & time: 03/17/19  1500     History   Chief Complaint Chief Complaint  Patient presents with  . Leg Pain    HPI Heather [name] is a 43 y.o. female with a history of obesity, depression, presented to the emergency department with back pain.  She reports that she has had dysuria for approximately 3 to 4 days.  She describes some blood in her urine and also burning with urination.  She describes difficulty with urination.  She also describes some fevers and chills, subjective at home.    She reports that yesterday she lost her footing while she was in the shower.  She did not fall but she felt like she "threw out" her back.  She reports immediate left-sided sciatica radiating down to her left knee.  She says she has had weakness in her left leg for several months.  She denies any falls at that time.  She is poor and continued pain in her back.  She denies to me that she has incontinence or saddle anesthesia.  She reports to me that she was on meloxicam from her spinal clinic, but ran out of her prescription recently, and feels her back pain is worsened since then.  She lives at home with her mother.  She normally is able to ambulate without use of a walker or cane.  She separately reports she has had some cough and congestion for the past few days.  She describes generalized myalgias.  She denies any history of spinal surgery.  Of note, the patient was seen in July of this year for an identical presentation of back pain, reported incontinence, and anesthesia.  She had MR imaging of the L-spine at the time.  She was diagnosed with a UTI and treated at that time, reports to me that her symptoms did improve after the treatment.  She was also seen in March 2020 for same presentation and had MRI at the time, with no evidence of significant central cord syndrome at that time.   MRI report on  11/04/18:  IMPRESSION: No change since the previous studies.  L5-S1 anterolisthesis of 3 mm due to facet arthropathy and probably pars defects. Bilateral foraminal stenosis that could cause neural compression on either or both sides.  L4-5 facet arthropathy with mild lateral recess and foraminal narrowing but no definite neural compression.  L2-3 disc herniation more prominent towards the left caudal down turning. Stenosis that could cause neural compression particularly on the left.  L1-2 disc protrusion with mild stenosis but no definite neural compression.  T12-L1 central disc herniation with upward migration. This level was not studied in the axial plane. No visible neural compression on the sagittal imaging.  Large primarily cystic but multi septated lesion of the right kidney measuring at least up to 6.8 cm. This could represent a cystic neoplasm. If this has not been evaluated otherwise, CT scan of the abdomen and pelvis with and without contrast is suggested.    HPI  Past Medical History:  Diagnosis Date  . Allergy   . Amenorrhea   . Anemia    post partum   . Anxiety   . Anxiety   . Arthritis   . Asthma   . Back pain   . Constipation   . COPD (chronic obstructive pulmonary disease) ([place])   . Delivery with history of C-section   . Depression   . Depression   .  Diabetes mellitus without complication [facility])    patient denies but states she has hyperglycemia-diet controlled  . Dysmenorrhea   . Dysrhythmia    DR Johnsie Cancel    . Ectopic pregnancy 2013  . Edema, lower extremity   . Eosinophilic esophagitis    Diagnosed at [facility] 06/16/2013, untreated  . Gallbladder problem   . GERD (gastroesophageal reflux disease)    HEARTBURN   TUMS  . Hard to intubate 11/07/2015  . High cholesterol   . IBS (irritable bowel syndrome)   . Leukocytosis 07/28/2008   Qualifier: Diagnosis of  By: Jonna Munro MD, Roderic Scarce    . Morbid obesity ([place])   . Neuromuscular disorder  (HCC)    RESTLESS LEG   . Obesity   . Schizoaffective disorder, bipolar type ([place])   . Sepsis ([place]) 11/11/2014  . Shortness of breath    WITH EXERTION   . Sleep apnea    CPAP- in process of restarting     Patient Active Problem List   Diagnosis Date Noted  . Blood in stool   . Gastritis and gastroduodenitis   . Benign neoplasm of sigmoid colon   . Difficult intubation 08/10/2018  . Class 3 severe obesity with serious comorbidity and body mass index (BMI) greater than or equal to 70 in adult ([place]) 06/16/2018  . Nexplanon insertion 03/27/2017  . Postpartum hypertension 03/05/2017  . Status post primary low transverse cesarean section 02/03/2017  . H/O pre-eclampsia 01/27/2017  . Gestational htn w/o significant proteinuria, third trimester 01/20/2017  . Mild persistent asthma without complication [id number]  . Perennial allergic rhinitis 11/05/2016  . Mild persistent asthma with acute exacerbation 11/05/2016  . Excess weight gain in pregnancy, second trimester 10/30/2016  . Anxiety and depression 11/07/2015  . Hard to intubate 11/07/2015  . Hypoglycemia 11/07/2015  . OSA (obstructive sleep apnea) 11/07/2015  . DM type 2 (diabetes mellitus, type 2) ([place]) 12/07/2014  . Panniculitis 12/06/2014  . Cellulitis, abdominal wall 11/11/2014  . Abdominal pain 07/14/2014  . Loose stools 07/14/2014  . Melena 01/27/2014  . Eosinophilic esophagitis [id number]  . Change in bowel habits 04/28/2013  . Esophageal dysphagia 04/28/2013  . Insomnia due to mental disorder(327.02) 08/08/2011  . RLS (restless legs syndrome) 08/08/2011  . Adjustment disorder with depressed mood 08/06/2011  . Schizoaffective disorder, bipolar type ([place]) 08/02/2011    Class: Acute  . PALPITATIONS, OCCASIONAL 11/01/2009  . Bipolar disorder ([place]) 10/19/2008  . DISORDER, TOBACCO USE 08/25/2008  . Leukocytosis 07/28/2008  . ALLERGIC RHINITIS, SEASONAL 08/20/2007  . DYSMETABOLIC SYNDROME [id number]  . Morbid obesity  ([place]) 05/13/2006  . EXTERNAL HEMORRHOIDS 05/13/2006  . HYPERLIPIDEMIA 05/12/2006  . Essential hypertension 05/12/2006  . ASTHMA 05/12/2006  . GERD (gastroesophageal reflux disease) 05/12/2006  . OSTEOARTHRITIS 05/12/2006    Past Surgical History:  Procedure Laterality Date  . BIOPSY  08/13/2018   Procedure: BIOPSY;  Surgeon: Yetta Flock, MD;  Location: [facility];  Service: Gastroenterology;;  . CESAREAN SECTION MULTI-GESTATIONAL N/A 02/03/2017   Procedure: CESAREAN SECTION MULTI-GESTATIONAL;  Surgeon: Jonnie Kind, MD;  Location: [facility];  Service: Obstetrics;  Laterality: N/A;  . CHOLECYSTECTOMY    . COLONOSCOPY WITH PROPOFOL N/A 08/13/2018   Procedure: COLONOSCOPY WITH PROPOFOL;  Surgeon: Yetta Flock, MD;  Location: [facility];  Service: Gastroenterology;  Laterality: N/A;  . DENTAL SURGERY    . ESOPHAGOGASTRODUODENOSCOPY  May 2007   Dr. Gala Romney: Normal esophagus, stomach, D1, D2  . ESOPHAGOGASTRODUODENOSCOPY  06/16/2013   Dr. Carlton Adam, eosinophilic esophagitis,  reactive gastropathy, no esophageal dilation  . ESOPHAGOGASTRODUODENOSCOPY (EGD) WITH PROPOFOL N/A 08/13/2018   Procedure: ESOPHAGOGASTRODUODENOSCOPY (EGD) WITH PROPOFOL;  Surgeon: Yetta Flock, MD;  Location: [facility];  Service: Gastroenterology;  Laterality: N/A;  . POLYPECTOMY  08/13/2018   Procedure: POLYPECTOMY;  Surgeon: Yetta Flock, MD;  Location: [facility];  Service: Gastroenterology;;  . TONSILLECTOMY    . TOOTH EXTRACTION  10/28/2011   Procedure: DENTAL RESTORATION/EXTRACTIONS;  Surgeon: Gae Bon, DDS;  Location: [facility] OR;  Service: Oral Surgery;;  . UPPER GASTROINTESTINAL ENDOSCOPY       OB History    Gravida  2   Para  1   Term  1   Preterm      AB  1   Living  2     SAB  0   TAB      Ectopic  1   Multiple  1   Live Births  2            Home Medications    Prior to Admission medications   Medication Sig Start Date End Date  Taking? Authorizing Provider  albuterol (VENTOLIN HFA) 108 (90 Base) MCG/ACT inhaler INHALE 2 PUFFS INTO THE LUNGS EVERY 4 HOURS AS NEEDED FOR WHEEZING OR SHORTNESS OF BREATH 02/03/19   Valentina Shaggy, MD  buPROPion (WELLBUTRIN XL) 300 MG 24 hr tablet Take 300 mg by mouth daily.  04/28/14   [provider]  busPIRone (BUSPAR) 10 MG tablet Take 20 mg by mouth 2 (two) times a day. 07/22/18   [provider]  cephALEXin (KEFLEX) 500 MG capsule Take 1 capsule (500 mg total) by mouth 4 (four) times daily. 11/04/18   Ward, Ozella Almond, PA-C  cephALEXin (KEFLEX) 500 MG capsule Take 1 capsule (500 mg total) by mouth 2 (two) times daily for 7 days. 03/18/19 03/25/19  Wyvonnia Dusky, MD  Cholecalciferol (VITAMIN D3) 5000 units CAPS Take 10,000 Units by mouth daily.     [provider]  citalopram (CELEXA) 40 MG tablet Take 40 mg by mouth at bedtime.     [provider]  CRANBERRY PO Take 1 tablet by mouth daily.    [provider]  Cyanocobalamin (VITAMIN B-12) 5000 MCG TBDP Take 5,000 mcg by mouth daily.    [provider]  fluticasone (FLONASE) 50 MCG/ACT nasal spray SHAKE LIQUID AND USE 2 SPRAYS IN EACH NOSTRIL DAILY AS NEEDED FOR ALLERGIES OR RHINITIS 02/03/19   Valentina Shaggy, MD  fluticasone (FLOVENT HFA) 110 MCG/ACT inhaler Inhale 2 puffs into the lungs 2 (two) times daily. 08/06/18   Valentina Shaggy, MD  HYDROcodone-acetaminophen (NORCO) 5-325 MG tablet Take 1 tablet by mouth every 6 (six) hours as needed for moderate pain. 11/04/18   Ward, Ozella Almond, PA-C  indapamide (LOZOL) 2.5 MG tablet Take 1 tablet (2.5 mg total) by mouth daily. 10/21/18   Jearld Lesch A, DO  meloxicam (MOBIC) 7.5 MG tablet Take 1 tablet (7.5 mg total) by mouth daily for 30 doses. 03/17/19 04/16/19  Wyvonnia Dusky, MD  Methylsulfonylmethane (MSM) 1000 MG CAPS Take 1,000 mg by mouth daily.     [provider]  montelukast (SINGULAIR) 10 MG tablet  TAKE 1 TABLET(10 MG) BY MOUTH AT BEDTIME 03/02/19   Valentina Shaggy, MD  pantoprazole (PROTONIX) 40 MG tablet Take 1 tablet (40 mg total) by mouth 2 (two) times daily. 10/28/18   Armbruster, Carlota Raspberry, MD  Prenatal Vit-Fe Fumarate-FA (MULTIVITAMIN-PRENATAL) 27-0.8 MG TABS  tablet Take 1 tablet by mouth daily at 12 noon.    [provider]  rOPINIRole (REQUIP) 3 MG tablet Take 1 tablet (3 mg total) by mouth at bedtime. 10/21/18   Jearld Lesch A, DO  Turmeric 500 MG CAPS Take 500 mg by mouth daily.     [provider]  vitamin E 400 UNIT capsule Take 400 Units by mouth daily.    [provider]    Family History Family History  Problem Relation Age of Onset  . Depression Mother   . Anxiety disorder Mother   . High blood pressure Mother   . Bipolar disorder Mother   . Eating disorder Mother   . Obesity Mother   . Hypertension Sister   . Allergic rhinitis Sister   . Colon polyps Maternal Grandmother        43s  . Diabetes Maternal Grandmother   . Anxiety disorder Maternal Grandmother   . COPD Maternal Grandmother   . Crohn's disease Maternal Aunt   . Cancer Maternal Grandfather        prostate  . HIV/AIDS Father   . Eating disorder Father   . Obesity Father   . Liver disease Neg Hx   . Angioedema Neg Hx   . Eczema Neg Hx   . Immunodeficiency Neg Hx   . Asthma Neg Hx   . Urticaria Neg Hx   . Colon cancer Neg Hx   . Esophageal cancer Neg Hx   . Rectal cancer Neg Hx   . Stomach cancer Neg Hx     Social History Social History   Tobacco Use  . Smoking status: Former Smoker    Packs/day: 0.50    Years: 8.00    Pack years: 4.00    Types: Cigarettes    Quit date: 04/25/2011    Years since quitting: 7.9  . Smokeless tobacco: Never Used  Substance Use Topics  . Alcohol use: No  . Drug use: No     Allergies   Bee venom, Penicillins, Adhesive [tape], Latex, and Vancomycin   Review of Systems Review of Systems  Constitutional: Negative for  chills and fever.  HENT: Positive for congestion.   Respiratory: Positive for cough. Negative for shortness of breath.   Cardiovascular: Negative for chest pain and palpitations.  Gastrointestinal: Negative for nausea and vomiting.  Genitourinary: Positive for difficulty urinating and hematuria. Negative for dysuria.  Musculoskeletal: Positive for back pain.  Neurological: Positive for weakness. Negative for seizures and syncope.  Psychiatric/Behavioral: Negative for agitation and confusion.  All other systems reviewed and are negative.    Physical Exam Updated Vital Signs BP (!) 100/53 (BP Location: Left Arm)   Pulse 94   Temp 98.9 F (37.2 C) (Oral)   Resp 20   Ht 5\' 2"  (1.575 m)   Wt (!) 204.1 kg   SpO2 99%   BMI 82.31 kg/m   Physical Exam Vitals signs and nursing note reviewed.  Constitutional:      General: She is not in acute distress.    Appearance: She is well-developed. She is obese.  HENT:     Head: Normocephalic and atraumatic.  Eyes:     Conjunctiva/sclera: Conjunctivae normal.  Neck:     Musculoskeletal: Neck supple.  Cardiovascular:     Rate and Rhythm: Normal rate and regular rhythm.     Pulses: Normal pulses.  Pulmonary:     Effort: Pulmonary effort is normal. No respiratory distress.     Breath sounds:  Normal breath sounds.  Abdominal:     General: There is no distension.     Palpations: Abdomen is soft.     Tenderness: There is no abdominal tenderness.  Skin:    General: Skin is warm and dry.  Neurological:     General: No focal deficit present.     Mental Status: She is alert and oriented to person, place, and time.     Sensory: No sensory deficit.     Motor: No weakness.     Comments: +straight leg test (left)      ED Treatments / Results  Labs (all labs ordered are listed, but only abnormal results are displayed) Labs Reviewed  URINALYSIS, ROUTINE W REFLEX MICROSCOPIC - Abnormal; Notable for the following components:      Result  Value   APPearance CLOUDY (*)    Hgb urine dipstick MODERATE (*)    Protein, ur 30 (*)    Leukocytes,Ua LARGE (*)    WBC, UA >50 (*)    Bacteria, UA MANY (*)    All other components within normal limits  URINE CULTURE  POC SARS CORONAVIRUS 2 AG -  ED    EKG None  Radiology No results found.  Procedures Procedures (including critical care time)  Medications Ordered in ED Medications  oxyCODONE-acetaminophen (PERCOCET/ROXICET) 5-325 MG per tablet 1 tablet (1 tablet Oral Given 03/17/19 1902)  cephALEXin (KEFLEX) capsule 500 mg (500 mg Oral Given 03/17/19 2013)     Initial Impression / Assessment and Plan / ED Course  I have reviewed the triage vital signs and the nursing notes.  Pertinent labs & imaging results that were available during my care of the patient were reviewed by me and considered in my medical decision making (see chart for details).  43 yo female w/ hx of sciatica and DDD seen on prior MR imaging presenting with neuropathy in left leg and worsening of sciatica type pain x 1 day, with reported urinary difficulties for several days prior to that.  She has had very similar ED presentations twice this year and has undergone subsequent MR imaging of the spine, and has been referred to (and follows with) the spine clinic.  She was diagnosed with a Proteus UTI on her July 2020 ED visit and treated with antibiotics.  My primary suspicion is that this is likely another UTI with excerbation of her chronic lower back pain and sciatica.  She has no neurological deficits or saddle anesthesia to suggest acute cord compression.  She does report running out of her meloxicam which was helping her back, and this may be contributing to her worsening pain.  I do not believe she needs another MRI at this time.  We'll check a UA and a rapid covid (she reports some congestion and coughing), and she can follow up in the spinal clinic tomorrow per her scheduled appointment.  She is otherwise  ambulatory at this time.  Clinical Course as of Mar 18 39  Wed Mar 17, 2019  1927 Bacteria, UA(!): MANY [MT]  1927 WBC, UA(!): >50 [MT]  2006 Patient reports improvement of her back pain and leg pain after medications.  I explained that she likely is UTI and will prescribe her Keflex (she was given a central line is no allergic reaction to it). She says she has a follow-up appointment in the spine clinic tomorrow.   [MT]    Clinical Course User Index [MT] Jyllian Haynie, Carola Rhine, MD    Final Clinical Impressions(s) /  ED Diagnoses   Final diagnoses:  Sciatica of left side  Cystitis    ED Discharge Orders         Ordered    cephALEXin (KEFLEX) 500 MG capsule  2 times daily     03/17/19 2008    meloxicam (MOBIC) 7.5 MG tablet  Daily     03/17/19 2008           Wyvonnia Dusky, MD 03/18/19 0040

## 2019-03-20 LAB — URINE CULTURE
Culture: 10000 — AB
Special Requests: NORMAL

## 2019-03-30 ENCOUNTER — Other Ambulatory Visit: Payer: Self-pay | Admitting: Allergy & Immunology

## 2019-04-08 ENCOUNTER — Other Ambulatory Visit: Payer: Self-pay | Admitting: *Deleted

## 2019-04-26 ENCOUNTER — Other Ambulatory Visit (INDEPENDENT_AMBULATORY_CARE_PROVIDER_SITE_OTHER): Payer: Self-pay | Admitting: Bariatrics

## 2019-04-26 DIAGNOSIS — G2581 Restless legs syndrome: Secondary | ICD-10-CM

## 2019-06-02 ENCOUNTER — Ambulatory Visit (INDEPENDENT_AMBULATORY_CARE_PROVIDER_SITE_OTHER): Payer: 59 | Admitting: Allergy & Immunology

## 2019-06-02 ENCOUNTER — Other Ambulatory Visit: Payer: Self-pay

## 2019-06-02 ENCOUNTER — Encounter: Payer: Self-pay | Admitting: Allergy & Immunology

## 2019-06-02 DIAGNOSIS — J3089 Other allergic rhinitis: Secondary | ICD-10-CM

## 2019-06-02 MED ORDER — ALBUTEROL SULFATE HFA 108 (90 BASE) MCG/ACT IN AERS: INHALATION_SPRAY | RESPIRATORY_TRACT | 1 refills | Status: DC

## 2019-06-02 MED ORDER — MONTELUKAST SODIUM 10 MG PO TABS: ORAL_TABLET | ORAL | 5 refills | Status: DC

## 2019-06-02 MED ORDER — FLOVENT HFA 110 MCG/ACT IN AERO: 2.0000 | INHALATION_SPRAY | Freq: Two times a day (BID) | RESPIRATORY_TRACT | 5 refills | Status: DC

## 2019-06-02 MED ORDER — FLUTICASONE PROPIONATE 50 MCG/ACT NA SUSP: NASAL | 5 refills | Status: DC

## 2019-06-02 MED ORDER — CEFDINIR 300 MG PO CAPS: 300.0000 mg | ORAL_CAPSULE | Freq: Two times a day (BID) | ORAL | 0 refills | Status: AC

## 2019-06-02 MED ORDER — LORATADINE 10 MG PO TABS: 10.0000 mg | ORAL_TABLET | Freq: Every day | ORAL | 5 refills | Status: DC

## 2019-06-02 NOTE — Progress Notes (Signed)
RE: Heather [name] MRN: [id number] DOB: 09-13-1975 Date of Telemedicine Visit: 06/02/2019  Referring provider: Lucianne Lei, MD Primary care provider: Lucianne Lei, MD  Chief Complaint: Allergies (Runny eyes. Eye drainage from right eye. )   Telemedicine Follow Up Visit via Telephone: I connected with Heather [name] for a follow up on 06/02/19 by telephone and verified that I am speaking with the correct person using two identifiers.   I discussed the limitations, risks, security and privacy concerns of performing an evaluation and management service by telephone and the availability of in person appointments. I also discussed with the patient that there may be a patient responsible charge related to this service. The patient expressed understanding and agreed to proceed.  Patient is at home.  Provider is at the office.  Visit start time: 10:54 AM Visit end time: 11:14 AM Insurance consent/check in by: [facility] Medical consent and medical assistant/nurse: Olivia Mackie  History of Present Illness:  She is a 44 y.o. female, who is being followed for moderate persistent asthma as well as allergic rhinitis. Her previous allergy office visit was in June 2020 with myself.  At that time, she was needing her rescue inhaler very often, so we stepped up her therapy to Symbicort 160/4.5 mcg 2 puffs twice daily as well as Singulair 10 mg daily.  We kept Flovent on board to add on during respiratory flares.  For her allergic rhinitis we continued with cetirizine as well as Flonase as needed.  She did have a rash which I felt was consistent with scabies.  We added on permethrin nightly for 1 week.  We also added on ivermectin x1 dose.  Since last visit, she has mostly done well.  She presents today for medication refill.  Asthma/Respiratory Symptom History: She remains on the Symbicort two puffs twice daily. She does need a refill of this. She needs a refill of the montelukast. She has needed her rescue  inhaler on a couple of occasions in the last few weeks.  She has not needed her Flovent in quite some time.  ACT score is 19, indicating excellent asthma control.  She has not needed to go to the emergency room and has not needed prednisone for her breathing in quite some time.  Allergic Rhinitis Symptom History: She reports that she has been having problems with her eyes. She has not been in her childcare facility since [place]. 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 In any case, she has been having problems with her eyes. She has been having a lot of sinus pressure and drainage coming out of her right eye. This is mostly greenish yellowish drainage. She has not had a fever with any of this. She also endorses some sinus pressure in the corner of her eye. She also notices some blood in her mucous when she blows her nose. This might have been longer than one week.   She continues to have these bug bites and has had an exterminator out twice for this. She has never gotten a definitive diagnosis of the bugs. She reports that her skin is itching like "crazy".   Heather [name] and Heather [name] are doing so well. They are going to be three years old in October. She continues to live with her mother. They live in [place]. She is working on a divorce. She cannot do that until the separation has been one year. Her soon to be ex husband has only seen the girls twice. She has  not heard from him since Christmas.   Otherwise, there have been no changes to her past medical history, surgical history, family history, or social history.  Assessment and Plan:  Jela is a 44 y.o. female with:  Moderate persistent asthma without complication  Perennial allergic rhinitis - needs repeat testing at some point  Acute sinusitis - initiating cefdinir for 1 week   Depression - with good social support network in place  Obesity - with a continuing loss of weight   Since the last visit, she seems to be  doing well. We are going to continue with the current medication regimen.  I think she would benefit from repeat skin testing and possible allergen immunotherapy initiation.  I am reassured that she sounds much better from a depression standpoint.  She was laughing on the phone today and seemed to be more upbeat.  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  Her kids are very active, as evidenced by her loud background noise.  Its unfortunate that she is going through all of her social people, but she seems to have a good grasp of this.  She continues to live with her mother, which is probably a good thing given everything she is going through.  We are going to treat her with cefdinir twice a day for one week to see how she does with this.   Diagnostics: None.  Medication List:  Current Outpatient Medications  Medication Sig Dispense Refill  . albuterol (VENTOLIN HFA) 108 (90 Base) MCG/ACT inhaler INHALE 2 PUFFS INTO THE LUNGS EVERY 4 HOURS AS NEEDED FOR WHEEZING OR SHORTNESS OF BREATH 18 g 0  . buPROPion (WELLBUTRIN XL) 300 MG 24 hr tablet Take 300 mg by mouth daily.   3  . busPIRone (BUSPAR) 10 MG tablet Take 20 mg by mouth 2 (two) times a day.    . Cholecalciferol (VITAMIN D3) 5000 units CAPS Take 10,000 Units by mouth daily.     Marland Kitchen CRANBERRY PO Take 1 tablet by mouth daily.    . Cyanocobalamin (VITAMIN B-12) 5000 MCG TBDP Take 5,000 mcg by mouth daily.    . fluticasone (FLONASE) 50 MCG/ACT nasal spray SHAKE LIQUID AND USE 2 SPRAYS IN EACH NOSTRIL DAILY AS NEEDED FOR ALLERGIES OR RHINITIS 48 g 1  . fluticasone (FLOVENT HFA) 110 MCG/ACT inhaler Inhale 2 puffs into the lungs 2 (two) times daily. 1 Inhaler 5  . loratadine (CLARITIN) 10 MG tablet Take 10 mg by mouth daily.    . Methylsulfonylmethane (MSM) 1000 MG CAPS Take 1,000 mg by mouth daily.     . montelukast (SINGULAIR) 10 MG tablet TAKE 1 TABLET(10 MG) BY MOUTH AT BEDTIME 30 tablet 0  .  pantoprazole (PROTONIX) 40 MG tablet Take 1 tablet (40 mg total) by mouth 2 (two) times daily. 180 tablet 1  . Prenatal Vit-Fe Fumarate-FA (MULTIVITAMIN-PRENATAL) 27-0.8 MG TABS tablet Take 1 tablet by mouth daily at 12 noon.    Marland Kitchen rOPINIRole (REQUIP) 3 MG tablet Take 1 tablet (3 mg total) by mouth at bedtime. 30 tablet 0  . Turmeric 500 MG CAPS Take 500 mg by mouth daily.     . vitamin E 400 UNIT capsule Take 400 Units by mouth daily.    . cephALEXin (KEFLEX) 500 MG capsule Take 1 capsule (500 mg total) by mouth 4 (four) times daily. 40 capsule 0  . citalopram (CELEXA) 40 MG tablet Take 40 mg by mouth at bedtime.     Marland Kitchen  HYDROcodone-acetaminophen (NORCO) 5-325 MG tablet Take 1 tablet by mouth every 6 (six) hours as needed for moderate pain. 10 tablet 0  . indapamide (LOZOL) 2.5 MG tablet Take 1 tablet (2.5 mg total) by mouth daily. 30 tablet 0   No current facility-administered medications for this visit.   Allergies: Allergies  Allergen Reactions  . Bee Venom Shortness Of Breath  . Penicillins Anaphylaxis    Has patient had a PCN reaction causing immediate rash, facial/tongue/throat swelling, SOB or lightheadedness with hypotension: No Has patient had a PCN reaction causing severe rash involving mucus membranes or skin necrosis: No Has patient had a PCN reaction that required hospitalization No Has patient had a PCN reaction occurring within the last 10 years: No If all of the above answers are "NO", then may proceed with Cephalosporin use.'  REACTION: Angioedema  . Adhesive [Tape] Rash  . Latex Rash  . Vancomycin Other (See Comments)    Pt can tolerate Vancomycin but did cause Red-Man Syndrome.  Recommend to pre-medicate with Benadryl before doses administered.     I reviewed her past medical history, social history, family history, and environmental history and no significant changes have been reported from previous visits.  Review of Systems  Constitutional: Negative for activity  change, appetite change, chills, fatigue and fever.  HENT: Negative for congestion, postnasal drip, rhinorrhea, sinus pressure and sore throat.   Eyes: Negative for pain, discharge, redness and itching.  Respiratory: Negative for cough, shortness of breath, wheezing and stridor.   Gastrointestinal: Negative for diarrhea, nausea and vomiting.  Endocrine: Negative for cold intolerance and heat intolerance.  Musculoskeletal: Negative for arthralgias, joint swelling and myalgias.  Skin: Negative for rash.  Allergic/Immunologic: Negative for environmental allergies and food allergies.    Objective:  Physical exam not obtained as encounter was done via telephone.   Previous notes and tests were reviewed.  I discussed the assessment and treatment plan with the patient. The patient was provided an opportunity to ask questions and all were answered. The patient agreed with the plan and demonstrated an understanding of the instructions.   The patient was advised to call back or seek an in-person evaluation if the symptoms worsen or if the condition fails to improve as anticipated.  I provided 20 minutes of non-face-to-face time during this encounter.  It was my pleasure to participate in [facility] care today. Please feel free to contact me with any questions or concerns.   Sincerely,  Valentina Shaggy, MD

## 2019-08-21 ENCOUNTER — Other Ambulatory Visit (INDEPENDENT_AMBULATORY_CARE_PROVIDER_SITE_OTHER): Payer: Self-pay | Admitting: Bariatrics

## 2019-08-21 DIAGNOSIS — I10 Essential (primary) hypertension: Secondary | ICD-10-CM

## 2019-09-28 ENCOUNTER — Other Ambulatory Visit: Payer: Self-pay

## 2019-09-28 MED ORDER — PANTOPRAZOLE SODIUM 40 MG PO TBEC: 40.0000 mg | DELAYED_RELEASE_TABLET | Freq: Two times a day (BID) | ORAL | 1 refills | Status: DC

## 2019-11-26 ENCOUNTER — Other Ambulatory Visit: Payer: Self-pay | Admitting: Allergy & Immunology

## 2019-12-02 ENCOUNTER — Other Ambulatory Visit: Payer: Self-pay | Admitting: Allergy & Immunology

## 2020-02-26 ENCOUNTER — Other Ambulatory Visit: Payer: Self-pay | Admitting: Allergy & Immunology

## 2020-02-28 ENCOUNTER — Other Ambulatory Visit: Payer: Self-pay | Admitting: Allergy & Immunology

## 2020-04-06 ENCOUNTER — Other Ambulatory Visit: Payer: Self-pay | Admitting: Allergy & Immunology

## 2020-05-05 ENCOUNTER — Ambulatory Visit (INDEPENDENT_AMBULATORY_CARE_PROVIDER_SITE_OTHER): Payer: Medicaid Other | Admitting: Professional

## 2020-05-05 ENCOUNTER — Other Ambulatory Visit: Payer: Self-pay

## 2020-05-05 DIAGNOSIS — F331 Major depressive disorder, recurrent, moderate: Secondary | ICD-10-CM | POA: Diagnosis not present

## 2020-05-05 DIAGNOSIS — F418 Other specified anxiety disorders: Secondary | ICD-10-CM | POA: Insufficient documentation

## 2020-05-05 DIAGNOSIS — F411 Generalized anxiety disorder: Secondary | ICD-10-CM
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
## 2020-05-15 ENCOUNTER — Other Ambulatory Visit: Payer: Self-pay

## 2020-05-15 ENCOUNTER — Telehealth (INDEPENDENT_AMBULATORY_CARE_PROVIDER_SITE_OTHER): Payer: Medicaid Other | Admitting: Psychiatry

## 2020-05-15 ENCOUNTER — Encounter (HOSPITAL_COMMUNITY): Payer: Self-pay

## 2020-05-15 DIAGNOSIS — F331 Major depressive disorder, recurrent, moderate: Secondary | ICD-10-CM | POA: Diagnosis not present

## 2020-05-15 DIAGNOSIS — G2581 Restless legs syndrome: Secondary | ICD-10-CM

## 2020-05-15 DIAGNOSIS — F411 Generalized anxiety disorder: Secondary | ICD-10-CM | POA: Diagnosis not present

## 2020-05-15 DIAGNOSIS — F4310 Post-traumatic stress disorder, unspecified: Secondary | ICD-10-CM | POA: Diagnosis not present

## 2020-05-15 MED ORDER — BUSPIRONE HCL 10 MG PO TABS: 20.0000 mg | ORAL_TABLET | Freq: Two times a day (BID) | ORAL | 0 refills | Status: DC

## 2020-05-15 MED ORDER — ROPINIROLE HCL 3 MG PO TABS: 3.0000 mg | ORAL_TABLET | Freq: Every day | ORAL | 0 refills | Status: DC

## 2020-05-15 MED ORDER — DULOXETINE HCL 30 MG PO CPEP: 60.0000 mg | ORAL_CAPSULE | Freq: Every day | ORAL | 1 refills | Status: DC

## 2020-05-15 MED ORDER — BUPROPION HCL ER (XL) 300 MG PO TB24: 300.0000 mg | ORAL_TABLET | Freq: Every day | ORAL | 0 refills | Status: DC
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
## 2020-05-26 ENCOUNTER — Other Ambulatory Visit: Payer: Self-pay

## 2020-05-26 ENCOUNTER — Ambulatory Visit (INDEPENDENT_AMBULATORY_CARE_PROVIDER_SITE_OTHER): Payer: Medicaid Other | Admitting: Professional

## 2020-05-26 DIAGNOSIS — F331 Major depressive disorder, recurrent, moderate: Secondary | ICD-10-CM

## 2020-05-26 NOTE — Progress Notes (Signed)
Virtual Visit via Video Note  I connected with Heather [name] on 05/26/20 at 11:00 AM EST by a video enabled telemedicine application and verified that I am speaking with the correct person using two identifiers.  Location: Patient: Home Provider: Clinical Home Office   I discussed the limitations of evaluation and management by telemedicine and the availability of in person appointments. The patient expressed understanding and agreed to proceed.   Follow Up Instructions:    I discussed the assessment and treatment plan with the patient. The patient was provided an opportunity to ask questions and all were answered. The patient agreed with the plan and demonstrated an understanding of the instructions.   The patient was advised to call back or seek an in-person evaluation if the symptoms worsen or if the condition fails to improve as anticipated.  I provided 38 minutes of non-face-to-face time during this encounter.   [name], [facility]    THERAPIST PROGRESS NOTE  Session Time: 73  Participation Level: Active  Behavioral Response: DisheveledAlertDepressed  Type of Therapy: Individual Therapy  Treatment Goals addressed: Depression  Interventions: CBT, Solution Focused, Strength-based, Supportive and Reframing  Summary: Heather [name] is a 45 y.o. female who presents with depression symptoms. Pt reports she is stressed due to her job. Pt recently got a promotion and is now working overnight. Pt reports this is a major change in her schedule and a stressor. Pt reports she has advocated for herself and told her job this schedule isn't going to work for her- nothing has changed yet. Cln and pt spend time discussing advocating for self. Pt reports she is having "physical issues" with "female issues." Pt reports she is unable to be seen until May. Pt reports doctor's office told her to go to the hospital if needs to be seen before. Pt is anxious about going to the  hospital and needing to stay. Pt reports having this issue before, it was sepsis. Cln and pt discuss going to hospital to get answers about what the rash is instead of building up anxiety about the "what ifs." Pt reports being concerned about going to the hospital due to [place]. Cln and pt discuss the option of urgent care or PCP for answers. Pt reports her back issues are causing her trouble and she does not want to deal with the steps. Cln offers idea of getting virtual appt. Pt reports "I can try." Cln and pt discuss the important of taking care of physical health and it's connection to mental health. Pt is resistant to options and reports she would rather just wait until May. Cln and pt spend time discussing goals for counseling. Pt reports she feels isolated and like "everyone has turned away from me." Cln and pt spent time discussing thoughts and reaching out to those she feels like shes "lost."   Suicidal/Homicidal: Nowithout intent/plan  Therapist Response: Cln asked how the client has been since CCA completed. Cln asked open ended questions about positive and/or negative changes that have occurred since CCA completed. Cln helped pt create a treatment plan with goals that pertain to patient's treatment. Cln used CBT to discuss thoughts and thought stopping. Cln used CBT to discuss black/white thinking.  Cln assisted with scheduling next appointment.  Pt will reach out to those "lost" over the pandemic to increase support.  Plan: Return again in 3 weeks.  Diagnosis: MDD, moderate    [name], [facility] 05/26/2020

## 2020-06-04 ENCOUNTER — Other Ambulatory Visit: Payer: Self-pay | Admitting: Allergy & Immunology

## 2020-06-08 ENCOUNTER — Ambulatory Visit (HOSPITAL_COMMUNITY): Payer: Medicaid Other | Admitting: Licensed Clinical Social Worker

## 2020-06-12 ENCOUNTER — Other Ambulatory Visit: Payer: Self-pay

## 2020-06-12 ENCOUNTER — Telehealth (HOSPITAL_COMMUNITY): Payer: Medicaid Other

## 2020-06-16 ENCOUNTER — Other Ambulatory Visit: Payer: Self-pay

## 2020-06-16 ENCOUNTER — Ambulatory Visit (INDEPENDENT_AMBULATORY_CARE_PROVIDER_SITE_OTHER): Payer: Medicaid Other | Admitting: Professional

## 2020-06-16 DIAGNOSIS — F331 Major depressive disorder, recurrent, moderate: Secondary | ICD-10-CM | POA: Diagnosis not present

## 2020-06-20 NOTE — Progress Notes (Signed)
Virtual Visit via Video Note  I connected with Heather [name] on 06/16/20 at 11:00 AM EST by a video enabled telemedicine application and verified that I am speaking with the correct person using two identifiers.  Location: Patient: Home Provider: Clinical Home Office   I discussed the limitations of evaluation and management by telemedicine and the availability of in person appointments. The patient expressed understanding and agreed to proceed.   Follow Up Instructions:    I discussed the assessment and treatment plan with the patient. The patient was provided an opportunity to ask questions and all were answered. The patient agreed with the plan and demonstrated an understanding of the instructions.   The patient was advised to call back or seek an in-person evaluation if the symptoms worsen or if the condition fails to improve as anticipated.  I provided 38 minutes of non-face-to-face time during this encounter.   Heather [name], [facility]    THERAPIST PROGRESS NOTE  Session Time: 57  Participation Level: Active  Behavioral Response: DisheveledAlertDepressed  Type of Therapy: Individual Therapy  Treatment Goals addressed: Depression  Interventions: CBT, Solution Focused, Strength-based, Supportive and Reframing  Summary: Heather [name] is a 45 y.o. female who presents with depression symptoms. Pt reports she doesn't want to talk about what has happened since last session then starts to talk. Pt reports she was terminated from job 2 days ago. Cln and pt spend time discussing this new reality. Pt reports she is struggling with trying to take care of both kids, husband with autism, and mother with dementia. Pt reports it was chaotic week last week. Pt reports increased tearfulness due to job loss. Cln and pt spend time discussing self-care: headphones and music; shower. Pt spends time discussing physical ailments: back and nerve pain in legs. Pt reports she has an  appointment with a doctor in May about the pain. Cln and pt discuss thought stopping. Denies SI/HI/AVH.  Suicidal/Homicidal: Nowithout intent/plan  Therapist Response: Cln asked how the client has been since last seen. Cln asked open ended questions about positive and/or negative changes that have occurred since last seen. Cln used CBT to discuss thoughts and thought stopping. Cln used CBT to discuss black/white thinking.  Cln assisted with scheduling next appointment.  Pt will reach out to those "lost" over the pandemic to increase support. Pt reports she is willing to try self-care 30 minutes 3x a week.    Plan: Return again in 3 weeks.  Diagnosis: MDD, moderate    Heather [name], [facility] 06/16/2020

## 2020-07-05 ENCOUNTER — Telehealth: Payer: Medicaid Other | Admitting: Family

## 2020-07-05 ENCOUNTER — Encounter: Payer: Self-pay | Admitting: Family

## 2020-07-05 DIAGNOSIS — L03311 Cellulitis of abdominal wall: Secondary | ICD-10-CM

## 2020-07-05 DIAGNOSIS — B372 Candidiasis of skin and nail: Secondary | ICD-10-CM

## 2020-07-05 MED ORDER — FLUCONAZOLE 150 MG PO TABS: 150.0000 mg | ORAL_TABLET | ORAL | 0 refills | Status: DC | PRN

## 2020-07-05 MED ORDER — NYSTATIN 100000 UNIT/GM EX CREA: 1.0000 "application " | TOPICAL_CREAM | Freq: Two times a day (BID) | CUTANEOUS | 0 refills | Status: DC

## 2020-07-05 MED ORDER — SULFAMETHOXAZOLE-TRIMETHOPRIM 800-160 MG PO TABS: 1.0000 | ORAL_TABLET | Freq: Two times a day (BID) | ORAL | 0 refills | Status: DC

## 2020-07-05 MED ORDER — NYSTATIN 100000 UNIT/GM EX POWD: 1.0000 "application " | Freq: Three times a day (TID) | CUTANEOUS | 0 refills | Status: DC

## 2020-07-05 NOTE — Progress Notes (Signed)
Virtual Visit  Note Due to COVID-19 pandemic this visit was conducted virtually. This visit type was conducted due to national recommendations for restrictions regarding the COVID-19 Pandemic (e.g. social distancing, sheltering in place) in an effort to limit this patient's exposure and mitigate transmission in our community. All issues noted in this document were discussed and addressed.  A physical exam was not performed with this format.  I connected with Heather [name] on 07/05/20 at 7:20 pm  by video and verified that I am speaking with the correct person using two identifiers. Heather [name] is currently located at home and children is currently with her during visit. The provider, Evelina Dun, FNP is located in their office at time of visit.  I discussed the limitations, risks, security and privacy concerns of performing an evaluation and management service by video and the availability of in person appointments. I also discussed with the patient that there may be a patient responsible charge related to this service. The patient expressed understanding and agreed to proceed.   History and Present Illness:  Rash This is a new problem. The current episode started 1 to 4 weeks ago. The problem has been gradually worsening since onset. The affected locations include the abdomen. The rash is characterized by bruising, redness, burning and itchiness. She was exposed to nothing. Associated symptoms include a fever. Pertinent negatives include no congestion, cough, shortness of breath or sore throat. Past treatments include cold compress, moisturizer and anti-itch cream ("diaper rash cream"). The treatment provided mild relief.      Review of Systems  Constitutional: Positive for fever.  HENT: Negative for congestion and sore throat.   Respiratory: Negative for cough and shortness of breath.   Skin: Positive for rash.     Observations/Objective: No SOB or distress noted, pt is  obese and under pannus is erythemas, clear discharge, and raw looking.   Assessment and Plan: 1. Candidiasis of skin - nystatin (MYCOSTATIN/NYSTOP) powder; Apply 1 application topically 3 (three) times daily.  Dispense: 60 g; Refill: 0 - nystatin cream (MYCOSTATIN); Apply 1 application topically 2 (two) times daily.  Dispense: 90 g; Refill: 0 - fluconazole (DIFLUCAN) 150 MG tablet; Take 1 tablet (150 mg total) by mouth every three (3) days as needed.  Dispense: 3 tablet; Refill: 0  2. Cellulitis, abdominal wall - sulfamethoxazole-trimethoprim (BACTRIM DS) 800-160 MG tablet; Take 1 tablet by mouth 2 (two) times daily.  Dispense: 14 tablet; Refill: 0  Keep area clean and dry Pt will follow up with PCP this week. Area looks very raw and irritated.  Report any fevers, increased redness, drainage, and pain.      I discussed the assessment and treatment plan with the patient. The patient was provided an opportunity to ask questions and all were answered. The patient agreed with the plan and demonstrated an understanding of the instructions.   The patient was advised to call back or seek an in-person evaluation if the symptoms worsen or if the condition fails to improve as anticipated.  The above assessment and management plan was discussed with the patient. The patient verbalized understanding of and has agreed to the management plan. Patient is aware to call the clinic if symptoms persist or worsen. Patient is aware when to return to the clinic for a follow-up visit. Patient educated on when it is appropriate to go to the emergency department.   Time call ended:  7:32 pm   I provided 12 minutes of   face-to-face  time during this encounter.    Evelina Dun, FNP

## 2020-07-05 NOTE — Patient Instructions (Signed)
Skin Yeast Infection  A skin yeast infection is a condition in which there is an overgrowth of yeast (candida) that normally lives on the skin. This condition usually occurs in areas of the skin that are constantly warm and moist, such as the armpits or the groin. What are the causes? This condition is caused by a change in the normal balance of the yeast and bacteria that live on the skin. What increases the risk? You are more likely to develop this condition if you:  Are obese.  Are pregnant.  Take birth control pills.  Have diabetes.  Take antibiotic medicines.  Take steroid medicines.  Are malnourished.  Have a weak body defense system (immune system).  Are 65 years of age or older.  Wear tight clothing. What are the signs or symptoms? The most common symptom of this condition is itchiness in the affected area. Other symptoms include:  Red, swollen area of the skin.  Bumps on the skin. How is this diagnosed?  This condition is diagnosed with a medical history and physical exam.  Your health care provider may check for yeast by taking light scrapings of the skin to be viewed under a microscope. How is this treated? This condition is treated with medicine. Medicines may be prescribed or be available over the counter. The medicines may be:  Taken by mouth (orally).  Applied as a cream or powder to your skin. Follow these instructions at home:  Take or apply over-the-counter and prescription medicines only as told by your health care provider.  Maintain a healthy weight. If you need help losing weight, talk with your health care provider.  Keep your skin clean and dry.  If you have diabetes, keep your blood sugar under control.  Keep all follow-up visits as told by your health care provider. This is important.   Contact a health care provider if:  Your symptoms go away and then return.  Your symptoms do not get better with treatment.  Your symptoms get  worse.  Your rash spreads.  You have a fever or chills.  You have new symptoms.  You have new warmth or redness of your skin. Summary  A skin yeast infection is a condition in which there is an overgrowth of yeast (candida) that normally lives on the skin. This condition is caused by a change in the normal balance of the yeast and bacteria that live on the skin.  Take or apply over-the-counter and prescription medicines only as told by your health care provider.  Keep your skin clean and dry.  Contact a health care provider if your symptoms do not get better with treatment. This information is not intended to replace advice given to you by your health care provider. Make sure you discuss any questions you have with your health care provider. Document Revised: 08/12/2017 Document Reviewed: 08/12/2017 Elsevier Patient Education  2021 Elsevier Inc.  

## 2020-07-07 ENCOUNTER — Other Ambulatory Visit: Payer: Self-pay

## 2020-07-07 ENCOUNTER — Ambulatory Visit (INDEPENDENT_AMBULATORY_CARE_PROVIDER_SITE_OTHER): Payer: Medicaid Other | Admitting: Professional

## 2020-07-07 DIAGNOSIS — F331 Major depressive disorder, recurrent, moderate: Secondary | ICD-10-CM | POA: Diagnosis not present

## 2020-07-07 NOTE — Progress Notes (Signed)
Virtual Visit via Video Note  I connected with Yasuko Lapage Mcmichen on 07/07/20 at 11:00 AM EDT by a video enabled telemedicine application and verified that I am speaking with the correct person using two identifiers.  Location: Patient: Home Provider: Clinical Home Office   I discussed the limitations of evaluation and management by telemedicine and the availability of in person appointments. The patient expressed understanding and agreed to proceed.   Follow Up Instructions:    I discussed the assessment and treatment plan with the patient. The patient was provided an opportunity to ask questions and all were answered. The patient agreed with the plan and demonstrated an understanding of the instructions.   The patient was advised to call back or seek an in-person evaluation if the symptoms worsen or if the condition fails to improve as anticipated.  I provided 46 minutes of non-face-to-face time during this encounter.   [name], [facility]    THERAPIST PROGRESS NOTE  Session Time: 69  Participation Level: Active  Behavioral Response: DisheveledAlertDepressed  Type of Therapy: Individual Therapy  Treatment Goals addressed: Depression  Interventions: CBT, Solution Focused, Strength-based, Supportive and Reframing  Summary: Lear Carstens Daudelin is a 45 y.o. female who presents with depression symptoms. Pt reports husband left "again" last week. Pt reports they were separated for over a year and he returned when pt got sick to help with the kids. Pt reports things "didn't change and didn't work out." Pt reports she is handling the situation well "because I knew in the back of my mind that it wasn't going to work." Pt reports he has not communicated over the last almost 2 weeks and she doubts he will, not even for their 2 girls. Pt reports she will seek a divorce when she can save the money. Pt reports her daughters haven't acted out since he left; "I think they're glad he's  gone." Pt reports she got a new job with [employer] that starts next week. Pt reports hours will be 10:30-6 Sunday-Thursday. 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 Cln and pt spend time discussing reframing and "should statements." Pt reports she has reached out to a few people for extra support. Pt denies SI/HI/AVH.   Suicidal/Homicidal: Nowithout intent/plan  Therapist Response: Cln asked how the client has been since last seen. Cln asked open ended questions about positive and/or negative changes that have occurred since last seen. Cln used active listening to understand and validate patient. Cln used CBT to discuss cognitive distortions and reframing. Cln assisted with scheduling next appointment.  Pt will reach out to those "lost" over the pandemic to increase support. Pt reports she is willing to try self-care 30 minutes 3x a week.   Pt will catch, challenge, change should statements and reframe thoughts.  Plan: Return again in 3 weeks.  Diagnosis: MDD, moderate    [name], [facility]  07/07/2020

## 2020-07-10 ENCOUNTER — Telehealth (INDEPENDENT_AMBULATORY_CARE_PROVIDER_SITE_OTHER): Payer: Medicaid Other | Admitting: Psychiatry

## 2020-07-10 ENCOUNTER — Other Ambulatory Visit: Payer: Self-pay

## 2020-07-10 ENCOUNTER — Encounter (HOSPITAL_COMMUNITY): Payer: Self-pay

## 2020-07-10 DIAGNOSIS — F4310 Post-traumatic stress disorder, unspecified: Secondary | ICD-10-CM | POA: Diagnosis not present

## 2020-07-10 DIAGNOSIS — G2581 Restless legs syndrome: Secondary | ICD-10-CM | POA: Diagnosis not present

## 2020-07-10 DIAGNOSIS — F331 Major depressive disorder, recurrent, moderate: Secondary | ICD-10-CM | POA: Diagnosis not present

## 2020-07-10 MED ORDER — ROPINIROLE HCL 4 MG PO TABS: 4.0000 mg | ORAL_TABLET | Freq: Every day | ORAL | 2 refills | Status: DC

## 2020-07-10 MED ORDER — BUPROPION HCL ER (XL) 300 MG PO TB24: 300.0000 mg | ORAL_TABLET | Freq: Every day | ORAL | 1 refills | Status: DC

## 2020-07-10 MED ORDER — QUETIAPINE FUMARATE 25 MG PO TABS: 25.0000 mg | ORAL_TABLET | Freq: Every day | ORAL | 2 refills | Status: DC

## 2020-07-10 MED ORDER — DULOXETINE HCL 30 MG PO CPEP: 60.0000 mg | ORAL_CAPSULE | Freq: Every day | ORAL | 1 refills | Status: DC
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
## 2020-07-11 ENCOUNTER — Ambulatory Visit: Payer: Self-pay | Admitting: Gastroenterology

## 2020-07-28 ENCOUNTER — Ambulatory Visit (INDEPENDENT_AMBULATORY_CARE_PROVIDER_SITE_OTHER): Payer: Medicaid Other | Admitting: Professional

## 2020-07-28 ENCOUNTER — Other Ambulatory Visit: Payer: Self-pay

## 2020-07-28 DIAGNOSIS — F331 Major depressive disorder, recurrent, moderate: Secondary | ICD-10-CM | POA: Diagnosis not present

## 2020-07-28 NOTE — Progress Notes (Signed)
Virtual Visit via Video Note  I connected with Heather [name] on 07/28/20 at 11:00 AM EDT by a video enabled telemedicine application and verified that I am speaking with the correct person using two identifiers.  Location: Patient: Home Provider: Clinical Home Office   I discussed the limitations of evaluation and management by telemedicine and the availability of in person appointments. The patient expressed understanding and agreed to proceed.   Follow Up Instructions:    I discussed the assessment and treatment plan with the patient. The patient was provided an opportunity to ask questions and all were answered. The patient agreed with the plan and demonstrated an understanding of the instructions.   The patient was advised to call back or seek an in-person evaluation if the symptoms worsen or if the condition fails to improve as anticipated.  I provided 25 minutes of non-face-to-face time during this encounter.   [name], [facility]    THERAPIST PROGRESS NOTE  Session Time: 52  Participation Level: Active  Behavioral Response: DisheveledAlertDepressed  Type of Therapy: Individual Therapy  Treatment Goals addressed: Depression  Interventions: CBT, Solution Focused, Strength-based, Supportive and Reframing  Summary: Heather [name] is a 45 y.o. female who presents with depression symptoms.Pt reports things have been "fine. Alright." Pt started a new job with [employer] and feels good about the new job. Pt reports the doctor put her on Serequel earlier this month and it is causing dry mouth. Cln and pt discuss options to help (water, hard candy). Pt reports mixed reviews. Cln spent time discussing calling the nurses line if needed about side effects. Pt reports she has not heard from husband since he left; pt reports she is feeling good about him being gone and thinks the girls are doing better without him around. Pt reports she has arbitration on May 4 due to an  eviction notice she is challenging. Cln and pt will meet on 5/6 to f/u on arbitration. Cln and pt spend time discussing how pt is reframing thoughts and meeting hurdles head on. Pt denies SI/HI/AVH.     Suicidal/Homicidal: Nowithout intent/plan  Therapist Response: Cln asked how the client has been since last seen. Cln asked open ended questions about positive and/or negative changes that have occurred since last seen. Cln used active listening to understand and validate patient. Cln used CBT to discuss cognitive distortions and reframing. Cln assisted with scheduling next appointment.  Pt reports she is willing to try self-care 30 minutes 3x a week.   Pt will catch, challenge, change should statements and reframe thoughts.  Plan: Return again in 2 weeks.  Diagnosis: MDD, moderate    [name], [facility]  07/28/2020

## 2020-08-09 ENCOUNTER — Other Ambulatory Visit: Payer: Self-pay

## 2020-08-09 ENCOUNTER — Inpatient Hospital Stay (HOSPITAL_COMMUNITY)
Admission: EM | Admit: 2020-08-09 | Discharge: 2020-08-18 | DRG: 564 | Disposition: A | Discharge status: Home Health | Payer: Medicaid Other | Attending: Internal Medicine | Admitting: Internal Medicine

## 2020-08-09 ENCOUNTER — Encounter (HOSPITAL_COMMUNITY): Payer: Self-pay

## 2020-08-09 DIAGNOSIS — D72829 Elevated white blood cell count, unspecified: Secondary | ICD-10-CM | POA: Diagnosis present

## 2020-08-09 DIAGNOSIS — F4310 Post-traumatic stress disorder, unspecified: Secondary | ICD-10-CM

## 2020-08-09 DIAGNOSIS — E876 Hypokalemia: Secondary | ICD-10-CM | POA: Diagnosis present

## 2020-08-09 DIAGNOSIS — L98499 Non-pressure chronic ulcer of skin of other sites with unspecified severity: Secondary | ICD-10-CM | POA: Diagnosis present

## 2020-08-09 DIAGNOSIS — T796XXA Traumatic ischemia of muscle, initial encounter: Principal | ICD-10-CM | POA: Diagnosis present

## 2020-08-09 DIAGNOSIS — Y92032 Bedroom in apartment as the place of occurrence of the external cause: Secondary | ICD-10-CM

## 2020-08-09 DIAGNOSIS — B964 Proteus (mirabilis) (morganii) as the cause of diseases classified elsewhere: Secondary | ICD-10-CM | POA: Diagnosis present

## 2020-08-09 DIAGNOSIS — Z8249 Family history of ischemic heart disease and other diseases of the circulatory system: Secondary | ICD-10-CM

## 2020-08-09 DIAGNOSIS — M25551 Pain in right hip: Secondary | ICD-10-CM | POA: Diagnosis present

## 2020-08-09 DIAGNOSIS — W06XXXA Fall from bed, initial encounter: Secondary | ICD-10-CM | POA: Diagnosis present

## 2020-08-09 DIAGNOSIS — W19XXXA Unspecified fall, initial encounter: Secondary | ICD-10-CM

## 2020-08-09 DIAGNOSIS — L03311 Cellulitis of abdominal wall: Secondary | ICD-10-CM | POA: Diagnosis present

## 2020-08-09 DIAGNOSIS — Z91040 Latex allergy status: Secondary | ICD-10-CM

## 2020-08-09 DIAGNOSIS — F319 Bipolar disorder, unspecified: Secondary | ICD-10-CM | POA: Diagnosis present

## 2020-08-09 DIAGNOSIS — Z20822 Contact with and (suspected) exposure to covid-19: Secondary | ICD-10-CM | POA: Diagnosis present

## 2020-08-09 DIAGNOSIS — Z825 Family history of asthma and other chronic lower respiratory diseases: Secondary | ICD-10-CM

## 2020-08-09 DIAGNOSIS — G4733 Obstructive sleep apnea (adult) (pediatric): Secondary | ICD-10-CM | POA: Diagnosis present

## 2020-08-09 DIAGNOSIS — G2581 Restless legs syndrome: Secondary | ICD-10-CM | POA: Diagnosis present

## 2020-08-09 DIAGNOSIS — N200 Calculus of kidney: Secondary | ICD-10-CM | POA: Diagnosis present

## 2020-08-09 DIAGNOSIS — L89223 Pressure ulcer of left hip, stage 3: Secondary | ICD-10-CM | POA: Diagnosis present

## 2020-08-09 DIAGNOSIS — Z9109 Other allergy status, other than to drugs and biological substances: Secondary | ICD-10-CM

## 2020-08-09 DIAGNOSIS — L89213 Pressure ulcer of right hip, stage 3: Secondary | ICD-10-CM | POA: Diagnosis present

## 2020-08-09 DIAGNOSIS — J449 Chronic obstructive pulmonary disease, unspecified: Secondary | ICD-10-CM | POA: Diagnosis present

## 2020-08-09 DIAGNOSIS — L8990 Pressure ulcer of unspecified site, unspecified stage: Secondary | ICD-10-CM | POA: Insufficient documentation

## 2020-08-09 DIAGNOSIS — Z79899 Other long term (current) drug therapy: Secondary | ICD-10-CM

## 2020-08-09 DIAGNOSIS — M6282 Rhabdomyolysis: Secondary | ICD-10-CM | POA: Diagnosis present

## 2020-08-09 DIAGNOSIS — Z6841 Body Mass Index (BMI) 40.0 and over, adult: Secondary | ICD-10-CM

## 2020-08-09 DIAGNOSIS — N390 Urinary tract infection, site not specified: Secondary | ICD-10-CM | POA: Diagnosis present

## 2020-08-09 DIAGNOSIS — E119 Type 2 diabetes mellitus without complications: Secondary | ICD-10-CM

## 2020-08-09 DIAGNOSIS — R269 Unspecified abnormalities of gait and mobility: Secondary | ICD-10-CM | POA: Diagnosis present

## 2020-08-09 DIAGNOSIS — L89893 Pressure ulcer of other site, stage 3: Secondary | ICD-10-CM | POA: Diagnosis present

## 2020-08-09 DIAGNOSIS — Z880 Allergy status to penicillin: Secondary | ICD-10-CM

## 2020-08-09 DIAGNOSIS — M793 Panniculitis, unspecified: Secondary | ICD-10-CM | POA: Diagnosis present

## 2020-08-09 DIAGNOSIS — D509 Iron deficiency anemia, unspecified: Secondary | ICD-10-CM | POA: Diagnosis present

## 2020-08-09 DIAGNOSIS — N179 Acute kidney failure, unspecified: Secondary | ICD-10-CM | POA: Diagnosis present

## 2020-08-09 DIAGNOSIS — Z791 Long term (current) use of non-steroidal anti-inflammatories (NSAID): Secondary | ICD-10-CM

## 2020-08-09 DIAGNOSIS — N2889 Other specified disorders of kidney and ureter: Secondary | ICD-10-CM | POA: Diagnosis present

## 2020-08-09 DIAGNOSIS — R32 Unspecified urinary incontinence: Secondary | ICD-10-CM | POA: Diagnosis present

## 2020-08-09 DIAGNOSIS — Z833 Family history of diabetes mellitus: Secondary | ICD-10-CM

## 2020-08-09 DIAGNOSIS — Z8371 Family history of colonic polyps: Secondary | ICD-10-CM

## 2020-08-09 DIAGNOSIS — L89892 Pressure ulcer of other site, stage 2: Secondary | ICD-10-CM | POA: Diagnosis present

## 2020-08-09 DIAGNOSIS — F411 Generalized anxiety disorder: Secondary | ICD-10-CM | POA: Diagnosis present

## 2020-08-09 DIAGNOSIS — E860 Dehydration: Secondary | ICD-10-CM | POA: Diagnosis present

## 2020-08-09 DIAGNOSIS — K219 Gastro-esophageal reflux disease without esophagitis: Secondary | ICD-10-CM | POA: Diagnosis present

## 2020-08-09 DIAGNOSIS — Z87891 Personal history of nicotine dependence: Secondary | ICD-10-CM

## 2020-08-09 DIAGNOSIS — Z881 Allergy status to other antibiotic agents status: Secondary | ICD-10-CM

## 2020-08-09 DIAGNOSIS — I10 Essential (primary) hypertension: Secondary | ICD-10-CM | POA: Diagnosis present

## 2020-08-09 NOTE — ED Triage Notes (Signed)
Brought in by EMS from home. Pt cares for herself at home. Pt states she fell yesterday and laid on the floor since then. No LOC, pain in right hip. EMS reports outwards rotation. It has been at least 24 hours since pt fell and has been on floor. A&Ox4. Entire house is full of spiders, roaches, and trash. Pt was supposed to see urologist tomorrow for rash in groin area.

## 2020-08-10 ENCOUNTER — Emergency Department (HOSPITAL_COMMUNITY): Payer: Medicaid Other

## 2020-08-10 ENCOUNTER — Inpatient Hospital Stay (HOSPITAL_COMMUNITY): Payer: Medicaid Other

## 2020-08-10 ENCOUNTER — Encounter (HOSPITAL_COMMUNITY): Payer: Self-pay | Admitting: Family Medicine

## 2020-08-10 DIAGNOSIS — Z6841 Body Mass Index (BMI) 40.0 and over, adult: Secondary | ICD-10-CM | POA: Diagnosis not present

## 2020-08-10 DIAGNOSIS — M6282 Rhabdomyolysis: Secondary | ICD-10-CM | POA: Diagnosis present

## 2020-08-10 DIAGNOSIS — M793 Panniculitis, unspecified: Secondary | ICD-10-CM | POA: Diagnosis present

## 2020-08-10 DIAGNOSIS — L89893 Pressure ulcer of other site, stage 3: Secondary | ICD-10-CM | POA: Diagnosis present

## 2020-08-10 DIAGNOSIS — L03311 Cellulitis of abdominal wall: Secondary | ICD-10-CM | POA: Diagnosis present

## 2020-08-10 DIAGNOSIS — N179 Acute kidney failure, unspecified: Secondary | ICD-10-CM | POA: Diagnosis present

## 2020-08-10 DIAGNOSIS — I10 Essential (primary) hypertension: Secondary | ICD-10-CM | POA: Diagnosis not present

## 2020-08-10 DIAGNOSIS — R269 Unspecified abnormalities of gait and mobility: Secondary | ICD-10-CM | POA: Diagnosis present

## 2020-08-10 DIAGNOSIS — N390 Urinary tract infection, site not specified: Secondary | ICD-10-CM | POA: Diagnosis present

## 2020-08-10 DIAGNOSIS — W06XXXA Fall from bed, initial encounter: Secondary | ICD-10-CM | POA: Diagnosis present

## 2020-08-10 DIAGNOSIS — L89223 Pressure ulcer of left hip, stage 3: Secondary | ICD-10-CM | POA: Diagnosis present

## 2020-08-10 DIAGNOSIS — E860 Dehydration: Secondary | ICD-10-CM | POA: Diagnosis present

## 2020-08-10 DIAGNOSIS — L98499 Non-pressure chronic ulcer of skin of other sites with unspecified severity: Secondary | ICD-10-CM | POA: Diagnosis present

## 2020-08-10 DIAGNOSIS — T796XXA Traumatic ischemia of muscle, initial encounter: Secondary | ICD-10-CM | POA: Diagnosis present

## 2020-08-10 DIAGNOSIS — G4733 Obstructive sleep apnea (adult) (pediatric): Secondary | ICD-10-CM | POA: Diagnosis present

## 2020-08-10 DIAGNOSIS — J449 Chronic obstructive pulmonary disease, unspecified: Secondary | ICD-10-CM | POA: Diagnosis present

## 2020-08-10 DIAGNOSIS — K219 Gastro-esophageal reflux disease without esophagitis: Secondary | ICD-10-CM | POA: Diagnosis present

## 2020-08-10 DIAGNOSIS — R32 Unspecified urinary incontinence: Secondary | ICD-10-CM | POA: Diagnosis present

## 2020-08-10 DIAGNOSIS — F411 Generalized anxiety disorder: Secondary | ICD-10-CM | POA: Diagnosis present

## 2020-08-10 DIAGNOSIS — R6510 Systemic inflammatory response syndrome (SIRS) of non-infectious origin without acute organ dysfunction: Secondary | ICD-10-CM | POA: Diagnosis not present

## 2020-08-10 DIAGNOSIS — G2581 Restless legs syndrome: Secondary | ICD-10-CM | POA: Diagnosis present

## 2020-08-10 DIAGNOSIS — L89892 Pressure ulcer of other site, stage 2: Secondary | ICD-10-CM | POA: Diagnosis present

## 2020-08-10 DIAGNOSIS — N170 Acute kidney failure with tubular necrosis: Secondary | ICD-10-CM

## 2020-08-10 DIAGNOSIS — B964 Proteus (mirabilis) (morganii) as the cause of diseases classified elsewhere: Secondary | ICD-10-CM | POA: Diagnosis present

## 2020-08-10 DIAGNOSIS — L89213 Pressure ulcer of right hip, stage 3: Secondary | ICD-10-CM | POA: Diagnosis present

## 2020-08-10 DIAGNOSIS — Y92032 Bedroom in apartment as the place of occurrence of the external cause: Secondary | ICD-10-CM | POA: Diagnosis not present

## 2020-08-10 DIAGNOSIS — F319 Bipolar disorder, unspecified: Secondary | ICD-10-CM | POA: Diagnosis present

## 2020-08-10 DIAGNOSIS — Z20822 Contact with and (suspected) exposure to covid-19: Secondary | ICD-10-CM | POA: Diagnosis present

## 2020-08-10 DIAGNOSIS — E119 Type 2 diabetes mellitus without complications: Secondary | ICD-10-CM | POA: Diagnosis present

## 2020-08-10 LAB — CK: Total CK: 2291 U/L — ABNORMAL HIGH (ref 38–234)

## 2020-08-10 LAB — CBC WITH DIFFERENTIAL/PLATELET
Abs Immature Granulocytes: 0.33 10*3/uL — ABNORMAL HIGH (ref 0.00–0.07)
Basophils Absolute: 0.1 10*3/uL (ref 0.0–0.1)
Basophils Relative: 0 %
Eosinophils Absolute: 0.2 10*3/uL (ref 0.0–0.5)
Eosinophils Relative: 1 %
HCT: 28.9 % — ABNORMAL LOW (ref 36.0–46.0)
Hemoglobin: 8.3 g/dL — ABNORMAL LOW (ref 12.0–15.0)
Immature Granulocytes: 2 %
Lymphocytes Relative: 13 %
Lymphs Abs: 2.5 10*3/uL (ref 0.7–4.0)
MCH: 21.9 pg — ABNORMAL LOW (ref 26.0–34.0)
MCHC: 28.7 g/dL — ABNORMAL LOW (ref 30.0–36.0)
MCV: 76.3 fL — ABNORMAL LOW (ref 80.0–100.0)
Monocytes Absolute: 0.9 10*3/uL (ref 0.1–1.0)
Monocytes Relative: 5 %
Neutro Abs: 15.2 10*3/uL — ABNORMAL HIGH (ref 1.7–7.7)
Neutrophils Relative %: 79 %
Platelets: 607 10*3/uL — ABNORMAL HIGH (ref 150–400)
RBC: 3.79 MIL/uL — ABNORMAL LOW (ref 3.87–5.11)
RDW: 21.3 % — ABNORMAL HIGH (ref 11.5–15.5)
WBC: 19.1 10*3/uL — ABNORMAL HIGH (ref 4.0–10.5)
nRBC: 0 % (ref 0.0–0.2)

## 2020-08-10 LAB — COMPREHENSIVE METABOLIC PANEL
ALT: 48 U/L — ABNORMAL HIGH (ref 0–44)
AST: 117 U/L — ABNORMAL HIGH (ref 15–41)
Albumin: 2.4 g/dL — ABNORMAL LOW (ref 3.5–5.0)
Alkaline Phosphatase: 98 U/L (ref 38–126)
Anion gap: 14 (ref 5–15)
BUN: 47 mg/dL — ABNORMAL HIGH (ref 6–20)
CO2: 21 mmol/L — ABNORMAL LOW (ref 22–32)
Calcium: 9 mg/dL (ref 8.9–10.3)
Chloride: 108 mmol/L (ref 98–111)
Creatinine, Ser: 4.18 mg/dL — ABNORMAL HIGH (ref 0.44–1.00)
GFR, Estimated: 13 mL/min — ABNORMAL LOW (ref >60)
Glucose, Bld: 90 mg/dL (ref 70–99)
Potassium: 4.4 mmol/L (ref 3.5–5.1)
Sodium: 143 mmol/L (ref 135–145)
Total Bilirubin: 0.4 mg/dL (ref 0.3–1.2)
Total Protein: 7.7 g/dL (ref 6.5–8.1)

## 2020-08-10 LAB — HEMOGLOBIN A1C
Hgb A1c MFr Bld: 5.6 % (ref 4.8–5.6)
Mean Plasma Glucose: 114.02 mg/dL

## 2020-08-10 LAB — FERRITIN: Ferritin: 68 ng/mL (ref 11–307)

## 2020-08-10 LAB — FOLATE: Folate: 4.2 ng/mL — ABNORMAL LOW (ref >5.9)

## 2020-08-10 LAB — LACTIC ACID, PLASMA: Lactic Acid, Venous: 1.7 mmol/L (ref 0.5–1.9)

## 2020-08-10 LAB — RESP PANEL BY RT-PCR (FLU A&B, COVID) ARPGX2
Influenza A by PCR: NEGATIVE
Influenza B by PCR: NEGATIVE
SARS Coronavirus 2 by RT PCR: NEGATIVE

## 2020-08-10 LAB — IRON AND TIBC
Iron: 22 ug/dL — ABNORMAL LOW (ref 28–170)
Saturation Ratios: 9 % — ABNORMAL LOW (ref 10.4–31.8)
TIBC: 238 ug/dL — ABNORMAL LOW (ref 250–450)
UIBC: 216 ug/dL

## 2020-08-10 LAB — VITAMIN B12: Vitamin B-12: 1303 pg/mL — ABNORMAL HIGH (ref 180–914)

## 2020-08-10 LAB — CBG MONITORING, ED: Glucose-Capillary: 87 mg/dL (ref 70–99)

## 2020-08-10 MED ORDER — ALBUTEROL SULFATE (2.5 MG/3ML) 0.083% IN NEBU: 3.0000 mL | INHALATION_SOLUTION | RESPIRATORY_TRACT | Status: DC | PRN

## 2020-08-10 MED ORDER — ACETAMINOPHEN 650 MG RE SUPP: 650.0000 mg | Freq: Four times a day (QID) | RECTAL | Status: DC | PRN

## 2020-08-10 MED ORDER — ROPINIROLE HCL 1 MG PO TABS
4.0000 mg | ORAL_TABLET | Freq: Every day | ORAL | Status: DC
  Administered 2020-08-10 – 2020-08-17 (×8): 4 mg via ORAL
  Filled 2020-08-10 (×8): qty 4

## 2020-08-10 MED ORDER — DULOXETINE HCL 60 MG PO CPEP
60.0000 mg | ORAL_CAPSULE | Freq: Every day | ORAL | Status: DC
Start: 2020-08-10 — End: 2020-08-18
  Administered 2020-08-10 – 2020-08-18 (×9): 60 mg via ORAL
  Filled 2020-08-10 (×9): qty 1

## 2020-08-10 MED ORDER — VITAMIN B-12 1000 MCG PO TABS
5000.0000 ug | ORAL_TABLET | Freq: Every day | ORAL | Status: DC
  Administered 2020-08-10 – 2020-08-18 (×9): 5000 ug via ORAL
  Filled 2020-08-10 (×9): qty 5

## 2020-08-10 MED ORDER — NYSTATIN 100000 UNIT/GM EX POWD
1.0000 "application " | Freq: Three times a day (TID) | CUTANEOUS | Status: DC
  Administered 2020-08-10 – 2020-08-18 (×22): 1 via TOPICAL
  Filled 2020-08-10 (×2): qty 15

## 2020-08-10 MED ORDER — DIPHENHYDRAMINE HCL 25 MG PO CAPS
25.0000 mg | ORAL_CAPSULE | Freq: Three times a day (TID) | ORAL | Status: DC | PRN
  Administered 2020-08-10: 25 mg via ORAL
  Filled 2020-08-10: qty 1

## 2020-08-10 MED ORDER — ONDANSETRON HCL 4 MG/2ML IJ SOLN: 4.0000 mg | Freq: Four times a day (QID) | INTRAMUSCULAR | Status: DC | PRN

## 2020-08-10 MED ORDER — CHLORHEXIDINE GLUCONATE 0.12 % MT SOLN
15.0000 mL | Freq: Two times a day (BID) | OROMUCOSAL | Status: DC
  Administered 2020-08-10 – 2020-08-18 (×15): 15 mL via OROMUCOSAL
  Filled 2020-08-10 (×16): qty 15

## 2020-08-10 MED ORDER — ENOXAPARIN SODIUM 100 MG/ML IJ SOSY
100.0000 mg | PREFILLED_SYRINGE | INTRAMUSCULAR | Status: DC
  Administered 2020-08-10 – 2020-08-17 (×8): 100 mg via SUBCUTANEOUS
  Filled 2020-08-10 (×9): qty 1

## 2020-08-10 MED ORDER — MONTELUKAST SODIUM 10 MG PO TABS
10.0000 mg | ORAL_TABLET | Freq: Every day | ORAL | Status: DC
  Administered 2020-08-10 – 2020-08-17 (×8): 10 mg via ORAL
  Filled 2020-08-10 (×8): qty 1

## 2020-08-10 MED ORDER — LORATADINE 10 MG PO TABS: 10.0000 mg | ORAL_TABLET | Freq: Every day | ORAL | Status: DC

## 2020-08-10 MED ORDER — QUETIAPINE FUMARATE 25 MG PO TABS
25.0000 mg | ORAL_TABLET | Freq: Every day | ORAL | Status: DC
  Administered 2020-08-10 – 2020-08-17 (×8): 25 mg via ORAL
  Filled 2020-08-10 (×8): qty 1

## 2020-08-10 MED ORDER — VITAMIN D3 25 MCG (1000 UNIT) PO TABS
10000.0000 [IU] | ORAL_TABLET | Freq: Every day | ORAL | Status: DC
  Administered 2020-08-10: 10000 [IU] via ORAL
  Filled 2020-08-10 (×2): qty 10

## 2020-08-10 MED ORDER — INSULIN ASPART 100 UNIT/ML IJ SOLN
0.0000 [IU] | Freq: Every day | INTRAMUSCULAR | Status: DC
  Filled 2020-08-10: qty 0.05

## 2020-08-10 MED ORDER — ACETAMINOPHEN 325 MG PO TABS: 650.0000 mg | ORAL_TABLET | Freq: Four times a day (QID) | ORAL | Status: DC | PRN

## 2020-08-10 MED ORDER — VANCOMYCIN VARIABLE DOSE PER UNSTABLE RENAL FUNCTION (PHARMACIST DOSING): Status: DC

## 2020-08-10 MED ORDER — BUPROPION HCL ER (XL) 300 MG PO TB24
300.0000 mg | ORAL_TABLET | Freq: Every day | ORAL | Status: DC
  Administered 2020-08-10 – 2020-08-18 (×9): 300 mg via ORAL
  Filled 2020-08-10 (×9): qty 1

## 2020-08-10 MED ORDER — SENNOSIDES-DOCUSATE SODIUM 8.6-50 MG PO TABS: 1.0000 | ORAL_TABLET | Freq: Every evening | ORAL | Status: DC | PRN

## 2020-08-10 MED ORDER — VANCOMYCIN HCL 2000 MG/400ML IV SOLN
2000.0000 mg | Freq: Once | INTRAVENOUS | Status: AC
  Administered 2020-08-10: 2000 mg via INTRAVENOUS
  Filled 2020-08-10: qty 400

## 2020-08-10 MED ORDER — SODIUM CHLORIDE 0.9 % IV SOLN: INTRAVENOUS | Status: DC

## 2020-08-10 MED ORDER — FLUTICASONE PROPIONATE 50 MCG/ACT NA SUSP: 1.0000 | Freq: Every day | NASAL | Status: DC

## 2020-08-10 MED ORDER — SODIUM CHLORIDE 0.9 % IV BOLUS
1000.0000 mL | Freq: Once | INTRAVENOUS | Status: AC
  Administered 2020-08-10: 1000 mL via INTRAVENOUS

## 2020-08-10 MED ORDER — SORBITOL 70 % SOLN: 30.0000 mL | Freq: Every day | Status: DC | PRN

## 2020-08-10 MED ORDER — ORAL CARE MOUTH RINSE
15.0000 mL | Freq: Two times a day (BID) | OROMUCOSAL | Status: DC
  Administered 2020-08-11 – 2020-08-18 (×12): 15 mL via OROMUCOSAL

## 2020-08-10 MED ORDER — PANTOPRAZOLE SODIUM 40 MG PO TBEC: 40.0000 mg | DELAYED_RELEASE_TABLET | Freq: Two times a day (BID) | ORAL | Status: DC

## 2020-08-10 MED ORDER — INSULIN ASPART 100 UNIT/ML IJ SOLN
0.0000 [IU] | Freq: Three times a day (TID) | INTRAMUSCULAR | Status: DC
  Filled 2020-08-10: qty 0.09

## 2020-08-10 MED ORDER — ONDANSETRON HCL 4 MG PO TABS: 4.0000 mg | ORAL_TABLET | Freq: Four times a day (QID) | ORAL | Status: DC | PRN

## 2020-08-10 MED ORDER — BUSPIRONE HCL 10 MG PO TABS
20.0000 mg | ORAL_TABLET | Freq: Two times a day (BID) | ORAL | Status: DC
  Administered 2020-08-10 – 2020-08-18 (×16): 20 mg via ORAL
  Filled 2020-08-10 (×16): qty 2

## 2020-08-10 MED ORDER — LIP MEDEX EX OINT
TOPICAL_OINTMENT | CUTANEOUS | Status: DC | PRN
  Filled 2020-08-10: qty 7

## 2020-08-10 NOTE — H&P (Signed)
History and Physical    Heather [name] IHK:[id number] DOB: 1975/07/23 DOA: 08/09/2020  PCP: Lucianne Lei, MD  Patient coming from: Home  I have personally briefly reviewed patient's old medical records available.   Chief Complaint: Laying on the floor and not able to get up for 2 days.  HPI: Heather [name] is a 45 y.o. female with medical history significant of multiple medical comorbidities, super morbid obesity with BMI of 76, sleep apnea untreated, chronic anemia, COPD, depression, type 2 diabetes not on treatment and ambulatory dysfunction, restless leg syndrome, he has a affective disorder bipolar type who presents to the emergency room as her mom called EMS because she fell on the floor and could not get up for 48 hours. Patient lives at home with her mom, she also has 43-year-old twins.  She takes care of her twins so she is very busy.  2 days ago she fell from couch, her mom tried to get her up but could not and they waited to see if she can get her up on her own.  When she was not able to do it they called EMS last night. Patient complains of dry mouth, dehydration, rashes on her groins and painful lesions on her pannus.  Denies any nausea or vomiting.  Denies any change in urine or bowel habits.  Denies any recent fever.  Has some hip pain but denies any head injury or injuries to other organs. Was recently given Bactrim for her rashes on the pannus.  ED Course: Blood pressure fairly stable.  On room air.  WBC count 19,000, hemoglobin 8.3.  Creatinine 4.18 with recent known creatinine of 0.82 years ago.  Hemoglobin 11.42 years ago. Skeletal survey including chest x-ray, right knee x-ray, left knee x-ray, pelvic x-ray and lumbar spine x-rays essentially normal. ER physician has ordered for a CT scan of the abdomen and pelvis and CT scan of the head, results pending. COVID-19 negative. Started on IV fluid resuscitation, given vancomycin for pannicolitis and admission requested due  to significant symptoms. EMS reported that her house is very unhygienic with insects and bugs.  Review of Systems: all systems are reviewed and pertinent positive as per HPI otherwise rest are negative.    Past Medical History:  Diagnosis Date  . Allergy   . Amenorrhea   . Anemia    post partum   . Anxiety   . Anxiety   . Arthritis   . Asthma   . Back pain   . Constipation   . COPD (chronic obstructive pulmonary disease) ([place])   . Delivery with history of C-section   . Depression   . Depression   . Diabetes mellitus without complication [facility])    patient denies but states she has hyperglycemia-diet controlled  . Dysmenorrhea   . Dysrhythmia    DR Johnsie Cancel    . Ectopic pregnancy 2013  . Edema, lower extremity   . Eosinophilic esophagitis    Diagnosed at [facility] 06/16/2013, untreated  . Gallbladder problem   . GERD (gastroesophageal reflux disease)    HEARTBURN   TUMS  . Hard to intubate 11/07/2015  . High cholesterol   . IBS (irritable bowel syndrome)   . Leukocytosis 07/28/2008   Qualifier: Diagnosis of  By: Jonna Munro MD, Roderic Scarce    . Morbid obesity ([place])   . Neuromuscular disorder (HCC)    RESTLESS LEG   . Obesity   . Schizoaffective disorder, bipolar type ([place])   . Sepsis ([place]) 11/11/2014  .  Shortness of breath    WITH EXERTION   . Sleep apnea    CPAP- in process of restarting     Past Surgical History:  Procedure Laterality Date  . BIOPSY  08/13/2018   Procedure: BIOPSY;  Surgeon: Yetta Flock, MD;  Location: [facility];  Service: Gastroenterology;;  . CESAREAN SECTION MULTI-GESTATIONAL N/A 02/03/2017   Procedure: CESAREAN SECTION MULTI-GESTATIONAL;  Surgeon: Jonnie Kind, MD;  Location: [place];  Service: Obstetrics;  Laterality: N/A;  . CHOLECYSTECTOMY    . COLONOSCOPY WITH PROPOFOL N/A 08/13/2018   Procedure: COLONOSCOPY WITH PROPOFOL;  Surgeon: Yetta Flock, MD;  Location: [facility];  Service: Gastroenterology;  Laterality:  N/A;  . DENTAL SURGERY    . ESOPHAGOGASTRODUODENOSCOPY  May 2007   Dr. Gala Romney: Normal esophagus, stomach, D1, D2  . ESOPHAGOGASTRODUODENOSCOPY  06/16/2013   Dr. Carlton Adam, eosinophilic esophagitis, reactive gastropathy, no esophageal dilation  . ESOPHAGOGASTRODUODENOSCOPY (EGD) WITH PROPOFOL N/A 08/13/2018   Procedure: ESOPHAGOGASTRODUODENOSCOPY (EGD) WITH PROPOFOL;  Surgeon: Yetta Flock, MD;  Location: [facility];  Service: Gastroenterology;  Laterality: N/A;  . POLYPECTOMY  08/13/2018   Procedure: POLYPECTOMY;  Surgeon: Yetta Flock, MD;  Location: [facility];  Service: Gastroenterology;;  . TONSILLECTOMY    . TOOTH EXTRACTION  10/28/2011   Procedure: DENTAL RESTORATION/EXTRACTIONS;  Surgeon: Gae Bon, DDS;  Location: [facility];  Service: Oral Surgery;;  . UPPER GASTROINTESTINAL ENDOSCOPY      Social history   reports that she quit smoking about 9 years ago. Her smoking use included cigarettes. She has a 4.00 pack-year smoking history. She has never used smokeless tobacco. She reports that she does not drink alcohol and does not use drugs.  Allergies  Allergen Reactions  . Bee Venom Shortness Of Breath  . Penicillins Anaphylaxis    Has patient had a PCN reaction causing immediate rash, facial/tongue/throat swelling, SOB or lightheadedness with hypotension: No Has patient had a PCN reaction causing severe rash involving mucus membranes or skin necrosis: No Has patient had a PCN reaction that required hospitalization No Has patient had a PCN reaction occurring within the last 10 years: No If all of the above answers are "NO", then may proceed with Cephalosporin use.'  REACTION: Angioedema  . Penicillin G   . Adhesive [Tape] Rash  . Latex Rash  . Vancomycin Other (See Comments)    Pt can tolerate Vancomycin but did cause Red-Man Syndrome.  Recommend to pre-medicate with Benadryl before doses administered.      Family History  Problem Relation Age of Onset  .  Depression Mother   . Anxiety disorder Mother   . High blood pressure Mother   . Bipolar disorder Mother   . Eating disorder Mother   . Obesity Mother   . Hypertension Sister   . Allergic rhinitis Sister   . Colon polyps Maternal Grandmother        64s  . Diabetes Maternal Grandmother   . Anxiety disorder Maternal Grandmother   . COPD Maternal Grandmother   . Crohn's disease Maternal Aunt   . Cancer Maternal Grandfather        prostate  . HIV/AIDS Father   . Eating disorder Father   . Obesity Father   . Liver disease Neg Hx   . Angioedema Neg Hx   . Eczema Neg Hx   . Immunodeficiency Neg Hx   . Asthma Neg Hx   . Urticaria Neg Hx   . Colon cancer Neg Hx   .  Esophageal cancer Neg Hx   . Rectal cancer Neg Hx   . Stomach cancer Neg Hx      Prior to Admission medications   Medication Sig Start Date End Date Taking? Authorizing Provider  DULoxetine (CYMBALTA) 30 MG capsule Take 2 capsules (60 mg total) by mouth daily. 07/10/20 10/08/20 Yes Patrecia Pour, NP  albuterol (VENTOLIN HFA) 108 (90 Base) MCG/ACT inhaler INHALE 2 PUFFS INTO THE LUNGS EVERY 4 HOURS AS NEEDED FOR WHEEZING OR SHORTNESS OF BREATH 12/02/19   Valentina Shaggy, MD  buPROPion (WELLBUTRIN XL) 300 MG 24 hr tablet Take 1 tablet (300 mg total) by mouth daily. 07/10/20 10/08/20  Patrecia Pour, NP  busPIRone (BUSPAR) 10 MG tablet Take 2 tablets (20 mg total) by mouth 2 (two) times daily. 05/15/20 08/13/20  Patrecia Pour, NP  Cholecalciferol (VITAMIN D3) 5000 units CAPS Take 10,000 Units by mouth daily.     [provider]  CRANBERRY PO Take 1 tablet by mouth daily.    [provider]  Cyanocobalamin (VITAMIN B-12) 5000 MCG TBDP Take 5,000 mcg by mouth daily.    [provider]  fluconazole (DIFLUCAN) 150 MG tablet Take 1 tablet (150 mg total) by mouth every three (3) days as needed. 07/05/20   Evelina Dun A, FNP  fluticasone (FLONASE) 50 MCG/ACT nasal spray SHAKE LIQUID AND USE 2 SPRAYS IN  EACH NOSTRIL DAILY AS NEEDED FOR ALLERGIES OR RHINITIS 04/06/20   Valentina Shaggy, MD  fluticasone (FLOVENT HFA) 110 MCG/ACT inhaler Inhale 2 puffs into the lungs 2 (two) times daily. 06/02/19   Valentina Shaggy, MD  loratadine (CLARITIN) 10 MG tablet Take 10 mg by mouth daily.    [provider]  loratadine (CLARITIN) 10 MG tablet TAKE 1 TABLET(10 MG) BY MOUTH DAILY 04/06/20   Valentina Shaggy, MD  meloxicam (MOBIC) 15 MG tablet Take 15 mg by mouth daily. 06/05/20   [provider]  Methylsulfonylmethane (MSM) 1000 MG CAPS Take 1,000 mg by mouth daily.     [provider]  montelukast (SINGULAIR) 10 MG tablet TAKE 1 TABLET(10 MG) BY MOUTH AT BEDTIME 11/26/19   Valentina Shaggy, MD  nystatin (MYCOSTATIN/NYSTOP) powder Apply 1 application topically 3 (three) times daily. 07/05/20   Evelina Dun A, FNP  nystatin cream (MYCOSTATIN) Apply 1 application topically 2 (two) times daily. 07/05/20   Evelina Dun A, FNP  pantoprazole (PROTONIX) 40 MG tablet Take 1 tablet (40 mg total) by mouth 2 (two) times daily. Please schedule a yearly follow up appointment for further refills. 09/28/19   Armbruster, Carlota Raspberry, MD  Prenatal Vit-Fe Fumarate-FA (MULTIVITAMIN-PRENATAL) 27-0.8 MG TABS tablet Take 1 tablet by mouth daily at 12 noon.    [provider]  QUEtiapine (SEROQUEL) 25 MG tablet Take 1 tablet (25 mg total) by mouth at bedtime. 07/10/20 08/09/20  Patrecia Pour, NP  rOPINIRole (REQUIP) 4 MG tablet Take 1 tablet (4 mg total) by mouth at bedtime. 07/10/20 07/10/21  Patrecia Pour, NP  sulfamethoxazole-trimethoprim (BACTRIM DS) 800-160 MG tablet Take 1 tablet by mouth 2 (two) times daily. 07/05/20   Evelina Dun A, FNP  Turmeric 500 MG CAPS Take 500 mg by mouth daily.     [provider]  vitamin E 400 UNIT capsule Take 400 Units by mouth daily.    [provider]    Physical Exam: Vitals:   08/10/20 0516 08/10/20 0530 08/10/20 0600  08/10/20 0900  BP: (!) 111/56 124/80 103/70 Marland Kitchen)  101/48  Pulse: 100 99 (!) 102 94  Resp: 18 18 19  (!) 22  Temp:      TempSrc:      SpO2: 94% 95% 96% 97%  Weight:      Height:        Constitutional: Morbidly obese.  On room air.  Not in any distress.  Anxious. Vitals:   08/10/20 0516 08/10/20 0530 08/10/20 0600 08/10/20 0900  BP: (!) 111/56 124/80 103/70 (!) 101/48  Pulse: 100 99 (!) 102 94  Resp: 18 18 19  (!) 22  Temp:      TempSrc:      SpO2: 94% 95% 96% 97%  Weight:      Height:       Eyes: PERRL, lids and conjunctivae normal ENMT: Mucous membranes are dry.   Respiratory: clear to auscultation bilaterally, no wheezing, no crackles. Normal respiratory effort. No accessory muscle use.  Cardiovascular: Regular rate and rhythm, no murmurs / rubs / gallops.  No pitting edema.  Obese and chronic edematous legs. Abdomen: no tenderness, no masses palpated. No hepatosplenomegaly. Bowel sounds positive.  Large area of redness and erythema, some superficial redness along the pannus, multiple skin excoriations. Musculoskeletal: no clubbing / cyanosis. No joint deformity upper and lower extremities. Good ROM, no contractures. Normal muscle tone.  Skin: no rashes, lesions, ulcers. No induration Neurologic: CN 2-12 grossly intact. Sensation intact, DTR normal. Strength 5/5 in all 4.  Psychiatric: Normal judgment and insight. Alert and oriented x 3. Normal mood.         Labs on Admission: I have personally reviewed following labs and imaging studies  CBC: Recent Labs  Lab 08/10/20 0506  WBC 19.1*  NEUTROABS 15.2*  HGB 8.3*  HCT 28.9*  MCV 76.3*  PLT 123456*   Basic Metabolic Panel: Recent Labs  Lab 08/10/20 0506  NA 143  K 4.4  CL 108  CO2 21*  GLUCOSE 90  BUN 47*  CREATININE 4.18*  CALCIUM 9.0   GFR: Estimated Creatinine Clearance: 30.4 mL/min (A) (by C-G formula based on SCr of 4.18 mg/dL (H)). Liver Function Tests: Recent Labs  Lab 08/10/20 0506  AST 117*   ALT 48*  ALKPHOS 98  BILITOT 0.4  PROT 7.7  ALBUMIN 2.4*   No results for input(s): LIPASE, AMYLASE in the last 168 hours. No results for input(s): AMMONIA in the last 168 hours. Coagulation Profile: No results for input(s): INR, PROTIME in the last 168 hours. Cardiac Enzymes: Recent Labs  Lab 08/10/20 0506  CKTOTAL 2,291*   BNP (last 3 results) No results for input(s): PROBNP in the last 8760 hours. HbA1C: No results for input(s): HGBA1C in the last 72 hours. CBG: No results for input(s): GLUCAP in the last 168 hours. Lipid Profile: No results for input(s): CHOL, HDL, LDLCALC, TRIG, CHOLHDL, LDLDIRECT in the last 72 hours. Thyroid Function Tests: No results for input(s): TSH, T4TOTAL, FREET4, T3FREE, THYROIDAB in the last 72 hours. Anemia Panel: No results for input(s): VITAMINB12, FOLATE, FERRITIN, TIBC, IRON, RETICCTPCT in the last 72 hours. Urine analysis:    Component Value Date/Time   COLORURINE YELLOW 03/17/2019 1855   APPEARANCEUR CLOUDY (A) 03/17/2019 1855   APPEARANCEUR Cloudy (A) 08/06/2016 1209   LABSPEC 1.011 03/17/2019 1855   PHURINE 7.0 03/17/2019 1855   GLUCOSEU NEGATIVE 03/17/2019 1855   HGBUR MODERATE (A) 03/17/2019 1855   HGBUR large 04/19/2008 [place] NEGATIVE 03/17/2019 1855   BILIRUBINUR Negative 08/06/2016 1209   [place] 03/17/2019 1855  PROTEINUR 30 (A) 03/17/2019 1855   UROBILINOGEN 0.2 12/06/2014 2049   NITRITE NEGATIVE 03/17/2019 1855   LEUKOCYTESUR LARGE (A) 03/17/2019 1855    Radiological Exams on Admission: DG Lumbar Spine 2-3 Views  Result Date: 08/10/2020 CLINICAL DATA:  Fall from bed 08/08/2020, late on floor and fell tonight, states hips hurt, completely immobile and weighs 400 pounds EXAM: LUMBAR SPINE - 2-3 VIEW COMPARISON:  MR 11/04/2018, radiograph 06/02/2014 FINDINGS: Exam quality is significantly diminished by patient body habitus which results in for contrast resolution. Redemonstrated grade 1  anterolisthesis L5 on S1 with probable pars defects. Posterior wedging of the L5 vertebrae is noted with up to 50% height loss which is similar to most recent lumbar MR imaging. No clear acute fracture or traumatic malalignment. Additional multilevel discogenic and facet degenerative changes throughout the included thoracolumbar levels, maximal L5-S1. IMPRESSION: Suboptimal imaging quality due to body habitus. No clear acute fracture or traumatic listhesis. Chronic grade 1 anterolisthesis L5 on S1 with probable pars defects. Posterior wedging deformity L5 with 50% height loss posteriorly, unchanged from comparison. Electronically Signed   By: Lovena Le M.D.   On: 08/10/2020 01:37   DG Pelvis 1-2 Views  Result Date: 08/10/2020 CLINICAL DATA:  Fall from bed 08/08/2020, immobile since fall EXAM: PELVIS - 1-2 VIEW COMPARISON:  Contemporary lumbar radiographs. FINDINGS: Suboptimal imaging quality due to patient body habitus. No clear acute fracture or traumatic osseous injury of the bony pelvis or proximal femora. Mild degenerative changes in the hips and pelvis. For findings in the lumbar spine, please see dedicated radiographs. IMPRESSION: Suboptimal imaging quality due to body habitus. No clear acute fracture or traumatic malalignment. Degenerative changes in the hips and pelvis. Dedicated lumbar spine imaging performed concurrently. Electronically Signed   By: Lovena Le M.D.   On: 08/10/2020 01:40   DG Chest Portable 1 View  Result Date: 08/10/2020 CLINICAL DATA:  Shortness of breath EXAM: PORTABLE CHEST 1 VIEW COMPARISON:  06/22/2018 FINDINGS: Cardiomegaly accentuated by low lung volumes. Low volume chest with indistinct perihilar interstitial markings. No Kerley lines, effusion, or pneumothorax. IMPRESSION: Limited low volume chest. Possible vascular congestion.  No edema or focal airspace disease. Electronically Signed   By: Monte Fantasia M.D.   On: 08/10/2020 06:20   DG Knee Left Port  Result  Date: 08/10/2020 CLINICAL DATA:  Fall. EXAM: PORTABLE LEFT KNEE - 1-2 VIEW COMPARISON:  04/11/2016. FINDINGS: Small bony density noted adjacent to the lateral aspect of the patella. This may represent a patellar fracture fragment, age undetermined. This was not present on prior study of 04/11/2016. Patella is again high-riding. Prominent tricompartment degenerative change again noted. Evaluation for effusion is difficult due to positioning. No prominent knee joint effusion noted. IMPRESSION: 1. Small bony density noted adjacent to the lateral aspect of the patella. This may represent patellar fracture fragment, age undetermined. This was not present on prior study of 04/11/2016. Patella is again high-riding. 2.  Prominent tricompartment degenerative change again noted. Electronically Signed   By: Marcello Moores  Register   On: 08/10/2020 06:12   DG Knee Right Port  Result Date: 08/10/2020 CLINICAL DATA:  Fall. EXAM: PORTABLE RIGHT KNEE - 1-2 VIEW COMPARISON:  06/02/2014. FINDINGS: Bipartite patella again noted. High-riding patella again noted. Tricompartment degenerative change, most prominent about the lateral compartment again noted. Interim progression of joint space loss from prior exam. No acute bony or joint abnormality. Evaluation for effusions difficult due to positioning. Small knee joint effusion cannot be excluded. IMPRESSION: 1. Bipartite patella  again noted. High-riding patella again noted. Tricompartment degenerative change, most prominent about the lateral compartment again noted. Interim progression of joint space loss from prior exam. 2. No acute bony or joint abnormality. Small knee joint effusion cannot be excluded. Electronically Signed   By: Marcello Moores  Register   On: 08/10/2020 06:21    EKG: Independently reviewed.  Not indicated at this time.  Assessment/Plan Principal Problem:   Acute renal failure (HCC) Active Problems:   Morbid obesity (HCC)   Leukocytosis   Essential hypertension   GERD  (gastroesophageal reflux disease)   Cellulitis, abdominal wall   DM type 2 (diabetes mellitus, type 2) (HCC)   OSA (obstructive sleep apnea)   Rhabdomyolysis     1.  Acute renal failure: Recently using Bactrim for panniculitis: Agree with admission given severity of symptoms.  Probably has prerenal failure with rhabdomyolysis and dehydration.  We will aggressively hydrate.  Intake and output monitoring.  If no adequate improvement in renal functions,  Probably it would improve. She had recently with Bactrim, discontinue.  We will check urinalysis to see any evidence of ATN. Patient is in the process of getting CT scanning of the abdomen pelvis, will look for any hydronephrosis or urinary retention.  2.  Traumatic rhabdomyolysis: Due to lying in the floor.  Will hydrate with maintenance IV fluid.  3.  Panniculitis/leukocytosis: Significant skin inflammation and ulceration.  Leukocytosis may be related to dehydration.  However given severity of symptoms, will treat with vancomycin with premedication. Local wound care.  Consult wound care team.  4.  Morbid obesity/sleep apnea: Does not use CPAP at home.  Will monitor.  5.  Essential hypertension: Blood pressure fairly stable.  She is not on any medications.  6.  Anxiety/depression/restless leg syndrome: Patient on multiple medications including Wellbutrin, BuSpar, Seroquel and Requip that she will continue.  7.  Safe disposition: Patient with mobility problems, she could not get up from the floor for 2 days and has two three year-old children at home.  Will consult case management for safety evaluation.  Work with PT OT.  8.  Anemia: Probably acute on chronic anemia.  No evidence of active bleeding.  Will check iron profile, 123456 and folic acid.  We will recheck hemoglobin after hydration.  DVT prophylaxis: Heparin subcu Code Status: Full code Family Communication: None.  She is talking to her mother Disposition Plan: Unknown at this  time. Consults called: Wound care Admission status: Telemetry.   Barb Merino MD [facility] Pager [phone]

## 2020-08-10 NOTE — ED Notes (Signed)
IV attempt x2 unsuccessful. IV team consulted.

## 2020-08-10 NOTE — ED Notes (Signed)
Pt to X-Ray via stretcher 

## 2020-08-10 NOTE — ED Notes (Signed)
IV team at bedside 

## 2020-08-10 NOTE — Progress Notes (Signed)
Pharmacy Antibiotic Note  Heather [name] is a 45 y.o. female admitted on 08/09/2020 with fal.  Pt fell and was on the floor all day, could not get up.  Pt has significant rash on her abdomen and legs. Pharmacy has been consulted to dose vancomycin for cellulitis.  Plan: Vancomycin 2gm x 1 Consider random vanc level in 24-48 hours due to elevated Scr  Height: 5\' 3"  (160 cm) Weight: (!) 196 kg (432 lb) IBW/kg (Calculated) : 52.4  Temp (24hrs), Avg:99.1 F (37.3 C), Min:99.1 F (37.3 C), Max:99.1 F (37.3 C)  Recent Labs  Lab 08/10/20 0506 08/10/20 0520  WBC 19.1*  --   CREATININE 4.18*  --   LATICACIDVEN  --  1.7    Estimated Creatinine Clearance: 30.4 mL/min (A) (by C-G formula based on SCr of 4.18 mg/dL (H)).    Allergies  Allergen Reactions  . Bee Venom Shortness Of Breath  . Penicillins Anaphylaxis    Has patient had a PCN reaction causing immediate rash, facial/tongue/throat swelling, SOB or lightheadedness with hypotension: No Has patient had a PCN reaction causing severe rash involving mucus membranes or skin necrosis: No Has patient had a PCN reaction that required hospitalization No Has patient had a PCN reaction occurring within the last 10 years: No If all of the above answers are "NO", then may proceed with Cephalosporin use.'  REACTION: Angioedema  . Penicillin G   . Adhesive [Tape] Rash  . Latex Rash  . Vancomycin Other (See Comments)    Pt can tolerate Vancomycin but did cause Red-Man Syndrome.  Recommend to pre-medicate with Benadryl before doses administered.      Antimicrobials this admission: 5/5 vanc >>  Dose adjustments this admission:   Microbiology results: 5/5 BCx:   Thank you for allowing pharmacy to be a part of this patient's care.  Dolly Rias RPh 08/10/2020, 6:18 AM

## 2020-08-10 NOTE — ED Notes (Addendum)
Pt back from x-ray.

## 2020-08-10 NOTE — ED Provider Notes (Signed)
[facility] DEPT Provider Note   CSN: [id number] Arrival date & time: 08/09/20  2240     History Chief Complaint  Patient presents with  . Fall  . Knee Pain    Heather [name] Rule is a 45 y.o. female.  Patient presents to the emergency department with a chief complaint of fall.  She states that she fell early this morning and was on the floor all day.  She could not get up.  She complains of low back and hip pain.  She complains of a significant rash on her abdomen and legs.  She states that she has been having urinary incontinence.  She denies any known fever.  She denies any treatments prior to arrival.  The history is provided by the patient. No language interpreter was used.       Past Medical History:  Diagnosis Date  . Allergy   . Amenorrhea   . Anemia    post partum   . Anxiety   . Anxiety   . Arthritis   . Asthma   . Back pain   . Constipation   . COPD (chronic obstructive pulmonary disease) ([place])   . Delivery with history of C-section   . Depression   . Depression   . Diabetes mellitus without complication [facility])    patient denies but states she has hyperglycemia-diet controlled  . Dysmenorrhea   . Dysrhythmia    DR Johnsie Cancel    . Ectopic pregnancy 2013  . Edema, lower extremity   . Eosinophilic esophagitis    Diagnosed at [facility] 06/16/2013, untreated  . Gallbladder problem   . GERD (gastroesophageal reflux disease)    HEARTBURN   TUMS  . Hard to intubate 11/07/2015  . High cholesterol   . IBS (irritable bowel syndrome)   . Leukocytosis 07/28/2008   Qualifier: Diagnosis of  By: Jonna Munro MD, Roderic Scarce    . Morbid obesity ([place])   . Neuromuscular disorder (HCC)    RESTLESS LEG   . Obesity   . Schizoaffective disorder, bipolar type ([place])   . Sepsis ([place]) 11/11/2014  . Shortness of breath    WITH EXERTION   . Sleep apnea    CPAP- in process of restarting     Patient Active Problem List   Diagnosis Date Noted  . PTSD  (post-traumatic stress disorder) 05/15/2020  . Major depressive disorder, recurrent episode, moderate ([place]) 05/05/2020  . Generalized anxiety disorder 05/05/2020  . Blood in stool   . Gastritis and gastroduodenitis   . Benign neoplasm of sigmoid colon   . Difficult intubation 08/10/2018  . Class 3 severe obesity with serious comorbidity and body mass index (BMI) greater than or equal to 70 in adult ([place]) 06/16/2018  . Nexplanon insertion 03/27/2017  . Postpartum hypertension 03/05/2017  . Status post primary low transverse cesarean section 02/03/2017  . H/O pre-eclampsia 01/27/2017  . Gestational htn w/o significant proteinuria, third trimester 01/20/2017  . Mild persistent asthma without complication 41/32/4401  . Perennial allergic rhinitis 11/05/2016  . Mild persistent asthma with acute exacerbation 11/05/2016  . Excess weight gain in pregnancy, second trimester 10/30/2016  . Hard to intubate 11/07/2015  . Hypoglycemia 11/07/2015  . OSA (obstructive sleep apnea) 11/07/2015  . DM type 2 (diabetes mellitus, type 2) ([place]) 12/07/2014  . Panniculitis 12/06/2014  . Cellulitis, abdominal wall 11/11/2014  . Abdominal pain 07/14/2014  . Loose stools 07/14/2014  . Melena 01/27/2014  . Eosinophilic esophagitis 02/72/5366  . Change in bowel habits  04/28/2013  . Esophageal dysphagia 04/28/2013  . Insomnia due to mental disorder(327.02) 08/08/2011  . RLS (restless legs syndrome) 08/08/2011  . PALPITATIONS, OCCASIONAL 11/01/2009  . DISORDER, TOBACCO USE 08/25/2008  . Leukocytosis 07/28/2008  . ALLERGIC RHINITIS, SEASONAL 08/20/2007  . DYSMETABOLIC SYNDROME 67/20/9470  . Morbid obesity ([place]) 05/13/2006  . EXTERNAL HEMORRHOIDS 05/13/2006  . HYPERLIPIDEMIA 05/12/2006  . Essential hypertension 05/12/2006  . ASTHMA 05/12/2006  . GERD (gastroesophageal reflux disease) 05/12/2006  . OSTEOARTHRITIS 05/12/2006    Past Surgical History:  Procedure Laterality Date  . BIOPSY  08/13/2018    Procedure: BIOPSY;  Surgeon: Yetta Flock, MD;  Location: [facility];  Service: Gastroenterology;;  . CESAREAN SECTION MULTI-GESTATIONAL N/A 02/03/2017   Procedure: CESAREAN SECTION MULTI-GESTATIONAL;  Surgeon: Jonnie Kind, MD;  Location: [facility];  Service: Obstetrics;  Laterality: N/A;  . CHOLECYSTECTOMY    . COLONOSCOPY WITH PROPOFOL N/A 08/13/2018   Procedure: COLONOSCOPY WITH PROPOFOL;  Surgeon: Yetta Flock, MD;  Location: [facility];  Service: Gastroenterology;  Laterality: N/A;  . DENTAL SURGERY    . ESOPHAGOGASTRODUODENOSCOPY  May 2007   Dr. Gala Romney: Normal esophagus, stomach, D1, D2  . ESOPHAGOGASTRODUODENOSCOPY  06/16/2013   Dr. Carlton Adam, eosinophilic esophagitis, reactive gastropathy, no esophageal dilation  . ESOPHAGOGASTRODUODENOSCOPY (EGD) WITH PROPOFOL N/A 08/13/2018   Procedure: ESOPHAGOGASTRODUODENOSCOPY (EGD) WITH PROPOFOL;  Surgeon: Yetta Flock, MD;  Location: [facility];  Service: Gastroenterology;  Laterality: N/A;  . POLYPECTOMY  08/13/2018   Procedure: POLYPECTOMY;  Surgeon: Yetta Flock, MD;  Location: [facility];  Service: Gastroenterology;;  . TONSILLECTOMY    . TOOTH EXTRACTION  10/28/2011   Procedure: DENTAL RESTORATION/EXTRACTIONS;  Surgeon: Gae Bon, DDS;  Location: [facility];  Service: Oral Surgery;;  . UPPER GASTROINTESTINAL ENDOSCOPY       OB History    Gravida  2   Para  1   Term  1   Preterm      AB  1   Living  2     SAB  0   IAB      Ectopic  1   Multiple  1   Live Births  2           Family History  Problem Relation Age of Onset  . Depression Mother   . Anxiety disorder Mother   . High blood pressure Mother   . Bipolar disorder Mother   . Eating disorder Mother   . Obesity Mother   . Hypertension Sister   . Allergic rhinitis Sister   . Colon polyps Maternal Grandmother        55s  . Diabetes Maternal Grandmother   . Anxiety disorder Maternal Grandmother   . COPD  Maternal Grandmother   . Crohn's disease Maternal Aunt   . Cancer Maternal Grandfather        prostate  . HIV/AIDS Father   . Eating disorder Father   . Obesity Father   . Liver disease Neg Hx   . Angioedema Neg Hx   . Eczema Neg Hx   . Immunodeficiency Neg Hx   . Asthma Neg Hx   . Urticaria Neg Hx   . Colon cancer Neg Hx   . Esophageal cancer Neg Hx   . Rectal cancer Neg Hx   . Stomach cancer Neg Hx     Social History   Tobacco Use  . Smoking status: Former Smoker    Packs/day: 0.50    Years: 8.00  Pack years: 4.00    Types: Cigarettes    Quit date: 04/25/2011    Years since quitting: 9.3  . Smokeless tobacco: Never Used  Vaping Use  . Vaping Use: Never used  Substance Use Topics  . Alcohol use: No  . Drug use: No    Home Medications Prior to Admission medications   Medication Sig Start Date End Date Taking? Authorizing Provider  albuterol (VENTOLIN HFA) 108 (90 Base) MCG/ACT inhaler INHALE 2 PUFFS INTO THE LUNGS EVERY 4 HOURS AS NEEDED FOR WHEEZING OR SHORTNESS OF BREATH 12/02/19   Alfonse Spruce, MD  buPROPion (WELLBUTRIN XL) 300 MG 24 hr tablet Take 1 tablet (300 mg total) by mouth daily. 07/10/20 10/08/20  Charm Rings, NP  busPIRone (BUSPAR) 10 MG tablet Take 2 tablets (20 mg total) by mouth 2 (two) times daily. 05/15/20 08/13/20  Charm Rings, NP  Cholecalciferol (VITAMIN D3) 5000 units CAPS Take 10,000 Units by mouth daily.     [provider]  CRANBERRY PO Take 1 tablet by mouth daily.    [provider]  Cyanocobalamin (VITAMIN B-12) 5000 MCG TBDP Take 5,000 mcg by mouth daily.    [provider]  DULoxetine (CYMBALTA) 30 MG capsule Take 2 capsules (60 mg total) by mouth daily. 07/10/20 10/08/20  Charm Rings, NP  fluconazole (DIFLUCAN) 150 MG tablet Take 1 tablet (150 mg total) by mouth every three (3) days as needed. 07/05/20   Jannifer Rodney A, FNP  fluticasone (FLONASE) 50 MCG/ACT nasal spray SHAKE LIQUID AND USE 2 SPRAYS IN  EACH NOSTRIL DAILY AS NEEDED FOR ALLERGIES OR RHINITIS 04/06/20   Alfonse Spruce, MD  fluticasone (FLOVENT HFA) 110 MCG/ACT inhaler Inhale 2 puffs into the lungs 2 (two) times daily. 06/02/19   Alfonse Spruce, MD  HYDROcodone-acetaminophen (NORCO) 5-325 MG tablet Take 1 tablet by mouth every 6 (six) hours as needed for moderate pain. 11/04/18   Ward, Chase Picket, PA-C  indapamide (LOZOL) 2.5 MG tablet Take 1 tablet (2.5 mg total) by mouth daily. 10/21/18   Corinna Capra A, DO  loratadine (CLARITIN) 10 MG tablet Take 10 mg by mouth daily.    [provider]  loratadine (CLARITIN) 10 MG tablet TAKE 1 TABLET(10 MG) BY MOUTH DAILY 04/06/20   Alfonse Spruce, MD  Methylsulfonylmethane (MSM) 1000 MG CAPS Take 1,000 mg by mouth daily.     [provider]  montelukast (SINGULAIR) 10 MG tablet TAKE 1 TABLET(10 MG) BY MOUTH AT BEDTIME 11/26/19   Alfonse Spruce, MD  nystatin (MYCOSTATIN/NYSTOP) powder Apply 1 application topically 3 (three) times daily. 07/05/20   Jannifer Rodney A, FNP  nystatin cream (MYCOSTATIN) Apply 1 application topically 2 (two) times daily. 07/05/20   Jannifer Rodney A, FNP  pantoprazole (PROTONIX) 40 MG tablet Take 1 tablet (40 mg total) by mouth 2 (two) times daily. Please schedule a yearly follow up appointment for further refills. 09/28/19   Armbruster, Willaim Rayas, MD  Prenatal Vit-Fe Fumarate-FA (MULTIVITAMIN-PRENATAL) 27-0.8 MG TABS tablet Take 1 tablet by mouth daily at 12 noon.    [provider]  QUEtiapine (SEROQUEL) 25 MG tablet Take 1 tablet (25 mg total) by mouth at bedtime. 07/10/20 08/09/20  Charm Rings, NP  rOPINIRole (REQUIP) 4 MG tablet Take 1 tablet (4 mg total) by mouth at bedtime. 07/10/20 07/10/21  Charm Rings, NP  sulfamethoxazole-trimethoprim (BACTRIM DS) 800-160 MG tablet Take 1 tablet by mouth 2 (two) times daily. 07/05/20  Hawks, Christy A, FNP  Turmeric 500 MG CAPS Take 500 mg by mouth daily.     [provider]  vitamin E 400 UNIT capsule Take 400 Units by mouth daily.    [provider]    Allergies    Bee venom, Penicillins, Penicillin g, Adhesive [tape], Latex, and Vancomycin  Review of Systems   Review of Systems  All other systems reviewed and are negative.   Physical Exam Updated Vital Signs BP 119/73   Pulse 99   Temp 99.1 F (37.3 C) (Oral)   Resp 18   Ht 5\' 3"  (1.6 m)   Wt (!) 196 kg   SpO2 98%   BMI 76.53 kg/m   Physical Exam Vitals and nursing note reviewed.  Constitutional:      General: She is not in acute distress.    Appearance: She is well-developed.  HENT:     Head: Normocephalic and atraumatic.  Eyes:     Conjunctiva/sclera: Conjunctivae normal.  Cardiovascular:     Rate and Rhythm: Regular rhythm. Tachycardia present.     Heart sounds: No murmur heard.   Pulmonary:     Effort: Pulmonary effort is normal. No respiratory distress.     Breath sounds: Normal breath sounds.  Abdominal:     Palpations: Abdomen is soft.     Tenderness: There is no abdominal tenderness.  Musculoskeletal:        General: Normal range of motion.     Cervical back: Neck supple.  Skin:    General: Skin is warm and dry.  Neurological:     Mental Status: She is alert and oriented to person, place, and time.  Psychiatric:        Mood and Affect: Mood normal.        Behavior: Behavior normal.          ED Results / Procedures / Treatments   Labs (all labs ordered are listed, but only abnormal results are displayed) Labs Reviewed  CBC WITH DIFFERENTIAL/PLATELET  COMPREHENSIVE METABOLIC PANEL  CK    EKG None  Radiology No results found.  Procedures .Critical Care Performed by: Montine Circle, PA-C Authorized by: Montine Circle, PA-C   Critical care provider statement:    Critical care time (minutes):  45   Critical care was necessary to treat or prevent imminent or life-threatening deterioration of the following conditions:   Renal failure   Critical care was time spent personally by me on the following activities:  Discussions with consultants, evaluation of patient's response to treatment, examination of patient, ordering and performing treatments and interventions, ordering and review of laboratory studies, ordering and review of radiographic studies, pulse oximetry, re-evaluation of patient's condition, obtaining history from patient or surrogate and review of old charts     Medications Ordered in ED Medications  sodium chloride 0.9 % bolus 1,000 mL (has no administration in time range)    ED Course  I have reviewed the triage vital signs and the nursing notes.  Pertinent labs & imaging results that were available during my care of the patient were reviewed by me and considered in my medical decision making (see chart for details).    MDM Rules/Calculators/A&P                          This patient complains of fall, this involves an extensive number of treatment options, and is a complaint that carries with it a high risk  of complications and morbidity.    Patient fell earlier yesterday morning.  She states that she laid on the ground all day.  She was unable to get up.  Differential includes rhabdomyolysis, sepsis, cellulitis, trauma   Pertinent Labs I ordered, reviewed, and interpreted labs, which included creatinine is 4.18, CK is 2200, leukocytosis to 19.1, hemoglobin 8.3, AST 117, ALT 48  Imaging Interpretation I ordered imaging studies which included plain films, which showed no obvious acute traumatic injury.   Medications I ordered medication fluids for AKI and vancomycin for cellulitis  Reassessments After the interventions stated above, I reevaluated the patient and found unchanged, but appears stable to wait for daytime admitting team.  Consultants Dr. Tonie [name], who will have patient admitted by day team.  Plan Admit    Final Clinical Impression(s) / ED Diagnoses Final  diagnoses:  Traumatic rhabdomyolysis, initial encounter [facility])  Panniculitis    Rx / DC Orders ED Discharge Orders    None       Montine Circle, PA-C 08/10/20 0630    Ezequiel Essex, MD 08/10/20 [phone]

## 2020-08-10 NOTE — Consult Note (Addendum)
[facility] Nurse Consult Note: Reason for Consult: Consult requested for pannus folds. Performed remotely after review of progress notes and photos in the EMR.  Pt fell at home and was down for an unknown period of time and was incontinent. Wounds are a combination of moisture associated skin damage and full thickness wounds related to cellulitis and skin rubbing together in the folds.  Wound type: Multiple scattered round full thickness lesions to pannus/thighs groin skin folds; refer to photos. All are red and moist and painful. Dressing/plan of care: Antibiotics will promote the optimal healing. Topical treatment orders provided for bedside nurses to perform to provide antimicrobial benefits, absorb drainage, and promote healing as follows: Measure and cut length of InterDry to fit in skin folds that have skin breakdown  Tuck InterDry fabric into skin folds in a single layer, allow for 2 inches of overhang from skin edges to allow for wicking to occur May remove to bathe; dry area thoroughly and then tuck into affected areas again  Do not apply any creams or ointments when using InterDry DO NOT THROW AWAY FOR 5 DAYS unless soiled with stool DO NOT [facility] product, this will inactivate the silver in the material  New sheet of Interdry should be applied after 5 days of use if patient continues to have skin breakdown Discontinue use of current sheets after 5/10.  Please re-consult if further assistance is needed.  Thank-you,  Julien Girt MSN, [place], [place], [place], [place]

## 2020-08-11 ENCOUNTER — Ambulatory Visit (HOSPITAL_COMMUNITY): Payer: Medicaid Other | Admitting: Professional

## 2020-08-11 DIAGNOSIS — L8990 Pressure ulcer of unspecified site, unspecified stage: Secondary | ICD-10-CM | POA: Insufficient documentation

## 2020-08-11 LAB — CK: Total CK: 1226 U/L — ABNORMAL HIGH (ref 38–234)

## 2020-08-11 LAB — CBC
HCT: 29 % — ABNORMAL LOW (ref 36.0–46.0)
Hemoglobin: 8 g/dL — ABNORMAL LOW (ref 12.0–15.0)
MCH: 21.6 pg — ABNORMAL LOW (ref 26.0–34.0)
MCHC: 27.6 g/dL — ABNORMAL LOW (ref 30.0–36.0)
MCV: 78.4 fL — ABNORMAL LOW (ref 80.0–100.0)
Platelets: 559 10*3/uL — ABNORMAL HIGH (ref 150–400)
RBC: 3.7 MIL/uL — ABNORMAL LOW (ref 3.87–5.11)
RDW: 21.6 % — ABNORMAL HIGH (ref 11.5–15.5)
WBC: 13.6 10*3/uL — ABNORMAL HIGH (ref 4.0–10.5)
nRBC: 0 % (ref 0.0–0.2)

## 2020-08-11 LAB — BASIC METABOLIC PANEL
Anion gap: 11 (ref 5–15)
BUN: 42 mg/dL — ABNORMAL HIGH (ref 6–20)
CO2: 21 mmol/L — ABNORMAL LOW (ref 22–32)
Calcium: 8.8 mg/dL — ABNORMAL LOW (ref 8.9–10.3)
Chloride: 110 mmol/L (ref 98–111)
Creatinine, Ser: 3.36 mg/dL — ABNORMAL HIGH (ref 0.44–1.00)
GFR, Estimated: 17 mL/min — ABNORMAL LOW (ref >60)
Glucose, Bld: 76 mg/dL (ref 70–99)
Potassium: 3.9 mmol/L (ref 3.5–5.1)
Sodium: 142 mmol/L (ref 135–145)

## 2020-08-11 LAB — GLUCOSE, CAPILLARY
Glucose-Capillary: 63 mg/dL — ABNORMAL LOW (ref 70–99)
Glucose-Capillary: 84 mg/dL (ref 70–99)
Glucose-Capillary: 78 mg/dL (ref 70–99)
Glucose-Capillary: 94 mg/dL (ref 70–99)

## 2020-08-11 LAB — PHOSPHORUS: Phosphorus: 4.1 mg/dL (ref 2.5–4.6)

## 2020-08-11 LAB — URINALYSIS, ROUTINE W REFLEX MICROSCOPIC
Bilirubin Urine: NEGATIVE
Glucose, UA: NEGATIVE mg/dL
Ketones, ur: NEGATIVE mg/dL
Nitrite: POSITIVE — AB
Protein, ur: 100 mg/dL — AB
Specific Gravity, Urine: 1.013 (ref 1.005–1.030)
WBC, UA: >50 WBC/hpf — ABNORMAL HIGH (ref 0–5)
pH: 7 (ref 5.0–8.0)

## 2020-08-11 LAB — HIV ANTIBODY (ROUTINE TESTING W REFLEX): HIV Screen 4th Generation wRfx: NONREACTIVE

## 2020-08-11 LAB — MAGNESIUM: Magnesium: 2.2 mg/dL (ref 1.7–2.4)

## 2020-08-11 LAB — VANCOMYCIN, RANDOM: Vancomycin Rm: 7

## 2020-08-11 MED ORDER — CHLORHEXIDINE GLUCONATE CLOTH 2 % EX PADS
6.0000 | MEDICATED_PAD | Freq: Every day | CUTANEOUS | Status: DC
  Administered 2020-08-11 – 2020-08-17 (×7): 6 via TOPICAL

## 2020-08-11 MED ORDER — VITAMIN D3 25 MCG (1000 UNIT) PO TABS
5000.0000 [IU] | ORAL_TABLET | Freq: Every day | ORAL | Status: DC
  Administered 2020-08-11 – 2020-08-18 (×8): 5000 [IU] via ORAL
  Filled 2020-08-11 (×9): qty 5

## 2020-08-11 MED ORDER — LINEZOLID 600 MG/300ML IV SOLN
600.0000 mg | Freq: Two times a day (BID) | INTRAVENOUS | Status: DC
  Administered 2020-08-11 – 2020-08-14 (×6): 600 mg via INTRAVENOUS
  Filled 2020-08-11 (×8): qty 300

## 2020-08-11 NOTE — Evaluation (Signed)
Occupational Therapy Evaluation Patient Details Name: Heather [name] MRN: [id number] DOB: 12/18/1975 Today's Date: 08/11/2020    History of Present Illness Patient is a 45 year old female admitted 5/4 with rhabdomyolsis after fall at home and unable to get up. PMH includes super morbid obesity with BMI of 76, sleep apnea untreated, chronic anemia, COPD, depression, type 2 diabetes not on treatment, restless leg syndrome, affective disorder bipolar type   Clinical Impression   Patient lives with her mom and three year old twins in an apartment with 3 flights of steps to get in. Patient states since her C section and epidural she received during child birth there has been a steady decline in her overall function. Reports back pain, headaches and lower extremity weakness. 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 Does not have any home equipment. Currently patient significantly limited by global pain, very limited ability to complete rolling in bed need 3+ or more to complete rolling onto hips. With bed in trendelenburg position patient able to push with LEs and use arms on bed rail to scoot up to head of bed. Patient declined trying to pull herself forward with bed rails once positioned upright. Will continue to follow acutely to maximize strength and activity tolerance in order to facilitate D/C to venue listed below.    Follow Up Recommendations  SNF    Equipment Recommendations  Other (comment) (bariatric bedside commode)       Precautions / Restrictions Precautions Precautions: Fall Restrictions Weight Bearing Restrictions: No      Mobility Bed Mobility Overal bed mobility: Needs Assistance Bed Mobility: Rolling Rolling: Total assist (+3 or more)         General bed mobility comments: cue patient to assist with use of bed rails, patient minimally able to  roll upper body. patient did little better rolling to her L side. would need multiple people to complete roll onto side    Transfers                 General transfer comment: unable, will need lift    Balance Overall balance assessment: History of Falls                                         ADL either performed or assessed with clinical judgement   ADL Overall ADL's : Needs assistance/impaired Eating/Feeding: Set up;Bed level   Grooming: Set up;Bed level   Upper Body Bathing: Moderate assistance;Bed level   Lower Body Bathing: Total assistance;Bed level   Upper Body Dressing : Maximal assistance;Bed level   Lower Body Dressing: Total assistance;Bed level     Toilet Transfer Details (indicate cue type and reason): unable Toileting- Clothing Manipulation and Hygiene: Total assistance;Bed level         General ADL Comments: patient requiring significant assistance with self care tasks due to painful extremities, weakness, poor activity tolerance and body habitus                  Pertinent Vitals/Pain Pain Assessment: Faces Faces Pain Scale: Hurts whole lot Pain Location: bilateral knees, L shoulder Pain Descriptors / Indicators: Sharp;Grimacing;Moaning Pain Intervention(s): Limited activity within patient's tolerance     Hand Dominance Left   Extremity/Trunk Assessment Upper Extremity Assessment Upper Extremity Assessment: Overall  WFL for tasks assessed   Lower Extremity Assessment Lower Extremity Assessment: Defer to PT evaluation   Cervical / Trunk Assessment Cervical / Trunk Assessment: Other exceptions Cervical / Trunk Exceptions: body habitus   Communication Communication Communication: No difficulties   Cognition Arousal/Alertness: Awake/alert Behavior During Therapy: WFL for tasks assessed/performed Overall Cognitive Status: Within Functional Limits for tasks assessed                                      General Comments  multiple sites of skin break down in body folds            Home Living Family/patient expects to be discharged to:: Private residence Living Arrangements: Parent;Other (Comment) (2 young children) Available Help at Discharge: Family Type of Home: Apartment Home Access: Stairs to enter [employer] of Steps: 3 full flights   Home Layout: One level     Bathroom Shower/Tub: Teacher, early years/pre: Standard     Home Equipment: None          Prior Functioning/Environment Level of Independence: Independent        Comments: 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 Patient reports now sponge bathing and very limited activity especially past few days difficulty with ambulation        OT Problem List: Decreased strength;Decreased activity tolerance;Impaired balance (sitting and/or standing);Decreased safety awareness;Decreased knowledge of use of DME or AE;Obesity;Pain      OT Treatment/Interventions: Self-care/ADL training;Therapeutic exercise;DME and/or AE instruction;Therapeutic activities;Balance training;Patient/family education    OT Goals(Current goals can be found in the care plan section) Acute Rehab OT Goals Patient Stated Goal: "I don't know what happened to me" OT Goal Formulation: With patient Time For Goal Achievement: 08/25/20 Potential to Achieve Goals: Fair  OT Frequency: Min 2X/week   Barriers to D/C: Inaccessible home environment  patient has 3 flights into her apartment       Co-evaluation PT/OT/SLP Co-Evaluation/Treatment: Yes Reason for Co-Treatment: For patient/therapist safety;To address functional/ADL transfers   OT goals addressed during session: Strengthening/ROM;ADL's and self-care      AM-PAC OT "6 Clicks" Daily Activity     Outcome Measure Help from another person  eating meals?: A Little Help from another person taking care of personal grooming?: A Little Help from another person toileting, which includes using toliet, bedpan, or urinal?: Total Help from another person bathing (including washing, rinsing, drying)?: Total Help from another person to put on and taking off regular upper body clothing?: A Lot Help from another person to put on and taking off regular lower body clothing?: Total 6 Click Score: 11   End of Session Nurse Communication: Mobility status  Activity Tolerance: Patient limited by pain Patient left: in bed;with call bell/phone within reach;with bed alarm set  OT Visit Diagnosis: History of falling (Z91.81);Pain;Other abnormalities of gait and mobility (R26.89);Muscle weakness (generalized) (M62.81) Pain - part of body:  (knees, L shoulder)                Time: 3382-5053 OT Time Calculation (min): 16 min Charges:  OT General Charges $OT Visit: 1 Visit OT Evaluation $OT Eval Low Complexity: [place] OT OT pager: [place] 08/11/2020, 2:07 PM

## 2020-08-11 NOTE — Progress Notes (Signed)
Pt pulled out IV, IV team notified, will start antibiotic whe new IV is placed. SRP, RN

## 2020-08-11 NOTE — Progress Notes (Signed)
NOTED NO ORDER FOR PATIENT TO BE ON HEART MONITOR. PT NOTED TO BE RESTLESS AND CONTINUES TO REMOVE HEART LEADS. MONITOR DISCONTINUED AT THIS TIME

## 2020-08-11 NOTE — Plan of Care (Signed)
Plan of care reviewed with pt and pt expressed an understanding

## 2020-08-11 NOTE — Progress Notes (Addendum)
IV restarted by IV Team, antibiotic given, pt reminded not to remove IV, acknowledged understanding. SRP, RN

## 2020-08-11 NOTE — Plan of Care (Signed)

## 2020-08-11 NOTE — Progress Notes (Signed)
PROGRESS NOTE    Heather [name]  FTD:[id number] DOB: [birth year] DOA: 08/09/2020 PCP: Lucianne Lei, MD   Brief Narrative:  This 45 years old morbidly obese female with multiple medical comorbidities, with BMI of 76, sleep apnea untreated, chronic anemia, COPD, depression, type 2 diabetes not on treatment, ambulatory dysfunction, restless leg syndrome affective  bipolar type presents in the ED as her mom called EMS because she fell on the floor and could not get up for 48 hours.  Patient has developed rashes on her groin and painful lesions on her pannus.  She denies any fevers but reports some hip pain, she was recently prescribed Bactrim for rashes on on the pannus. In the ED WBC 19 K, hemoglobin 8.3, creatinine 4.18, Skeletal survey was completely unremarkable.  CT abdomen and pelvis is pending.  Patient was given IV fluids, vancomycin for panniculitis.  EMS reported that her house was very unhygienic with insects and bugs. Patient was admitted for acute kidney injury, traumatic rhabdomyolysis and panniculitis.  Assessment & Plan:   Principal Problem:   Acute renal failure (HCC) Active Problems:   Morbid obesity (HCC)   Leukocytosis   Essential hypertension   GERD (gastroesophageal reflux disease)   Cellulitis, abdominal wall   DM type 2 (diabetes mellitus, type 2) (HCC)   OSA (obstructive sleep apnea)   Rhabdomyolysis   Pressure injury of skin  Acute kidney injury could be multifactorial. Probably prerenal with rhabdomyolysis and dehydration. Recently used Bactrim for panniculitis. We will aggressively hydrate with IV fluids. Monitor intake and output charting. If no adequate improvement in renal functions, will consider nephro consult. CT abdomen and pelvis is pending to rule out hydronephrosis. Avoid nephrotoxic medications, continue to trend serum creatinine. Creatinine 4.18> 3.36  Traumatic rhabdomyolysis: She was lying on the floor for long time.  CK elevated 2291 >  1226 Continue IV hydration, continue to trend CK.  Panniculitis/  Leukocytosis: She has significant skin inflammation and ulceration. Will treat with vancomycin.  Local wound care/ wound care consult. Discussed with pharmacy vancomycin changed to linezolid.  Morbid obesity/sleep apnea : Patient does not use CPAP at home. Monitor clinical condition.   Essential hypertension:   Blood pressure fairly stable. She is not taking any blood pressure medications.   Anxiety/depression/restless leg syndrome.   Continue home medications Wellbutrin, BuSpar, Seroquel and Requip.  Safe disposition:   Patient with mobility problems, she could not get up from the floor for 2 days and has to 48-year old children at home.  we will consult case management for safety evaluation.  PT and OT evaluation.   Chronic anemia :   There is no evidence of active bleeding. check iron profile, C62 and folic acid.   DVT prophylaxis: Heparin subcu Code Status: Full code Family Communication:No family @ bedside  Disposition Plan:  Status is: Inpatient  Remains inpatient appropriate because:Inpatient level of care appropriate due to severity of illness   Dispo: The patient is from: Home              Anticipated d/c is to: Home              Patient currently is not medically stable to d/c.   Difficult to place patient No   Consultants:    None  Procedures:  Antimicrobials:      Anti-infectives (From admission, onward)   Start     Dose/Rate Route Frequency Ordered Stop   08/11/20 1400  linezolid (ZYVOX) IVPB 600 mg  600 mg 300 mL/hr over 60 Minutes Intravenous Every 12 hours 08/11/20 1255     08/10/20 0620  vancomycin variable dose per unstable renal function (pharmacist dosing)  Status:  Discontinued         Does not apply See admin instructions 08/10/20 0620 08/11/20 1252   08/10/20 0600  vancomycin (VANCOREADY) IVPB 2000 mg/400 mL        2,000 mg 100 mL/hr over 240 Minutes  Intravenous  Once 08/10/20 0547 08/10/20 1626      Subjective: Patient was seen and examined at bedside.  She looks comfortable , morbidly obese denies any pain. She has significant skin lesions and ulcerations noted on the pannus area.  Objective: Vitals:   08/11/20 0454 08/11/20 0500 08/11/20 0932 08/11/20 1237  BP:  96/68  128/70  Pulse:  (!) 110  (!) 102  Resp:  20  18  Temp:  99.1 F (37.3 C)  99.6 F (37.6 C)  TempSrc:  Oral  Oral  SpO2:  92%  94%  Weight: (!) 230 kg  (!) 229.1 kg   Height:        Intake/Output Summary (Last 24 hours) at 08/11/2020 1423 Last data filed at 08/11/2020 0326 Gross per 24 hour  Intake 2250 ml  Output 600 ml  Net 1650 ml   Filed Weights   08/10/20 1752 08/11/20 0454 08/11/20 0932  Weight: (!) 230 kg (!) 230 kg (!) 229.1 kg    Examination:  General exam: Appears calm and comfortable, not in any acute distress Respiratory system: Clear to auscultation. Respiratory effort normal. Cardiovascular system: S1 & S2 heard, RRR. No JVD, murmurs, rubs, gallops or clicks. No pedal edema. Gastrointestinal system: Abdomen is nondistended, soft and nontender. No organomegaly or masses felt.  Normal bowel sounds heard. Central nervous system: Alert and oriented. No focal neurological deficits. Extremities: No edema, no cyanosis, no clubbing. Skin: Multiple skin excoriations are noted in the pannus area. Psychiatry: Judgement and insight appear normal. Mood & affect appropriate.     Data Reviewed: I have personally reviewed following labs and imaging studies  CBC: Recent Labs  Lab 08/10/20 0506 08/11/20 0321  WBC 19.1* 13.6*  NEUTROABS 15.2*  --   HGB 8.3* 8.0*  HCT 28.9* 29.0*  MCV 76.3* 78.4*  PLT 607* 010*   Basic Metabolic Panel: Recent Labs  Lab 08/10/20 0506 08/11/20 0321  NA 143 142  K 4.4 3.9  CL 108 110  CO2 21* 21*  GLUCOSE 90 76  BUN 47* 42*  CREATININE 4.18* 3.36*  CALCIUM 9.0 8.8*  MG  --  2.2  PHOS  --  4.1    GFR: Estimated Creatinine Clearance: 42.4 mL/min (A) (by C-G formula based on SCr of 3.36 mg/dL (H)). Liver Function Tests: Recent Labs  Lab 08/10/20 0506  AST 117*  ALT 48*  ALKPHOS 98  BILITOT 0.4  PROT 7.7  ALBUMIN 2.4*   No results for input(s): LIPASE, AMYLASE in the last 168 hours. No results for input(s): AMMONIA in the last 168 hours. Coagulation Profile: No results for input(s): INR, PROTIME in the last 168 hours. Cardiac Enzymes: Recent Labs  Lab 08/10/20 0506 08/11/20 0321  CKTOTAL 2,291* 1,226*   BNP (last 3 results) No results for input(s): PROBNP in the last 8760 hours. HbA1C: Recent Labs    08/10/20 0506  HGBA1C 5.6   CBG: Recent Labs  Lab 08/10/20 1226 08/11/20 0837 08/11/20 1247  GLUCAP 87 63* 84   Lipid Profile: No results  for input(s): CHOL, HDL, LDLCALC, TRIG, CHOLHDL, LDLDIRECT in the last 72 hours. Thyroid Function Tests: No results for input(s): TSH, T4TOTAL, FREET4, T3FREE, THYROIDAB in the last 72 hours. Anemia Panel: Recent Labs    08/10/20 1119  VITAMINB12 1,303*  FOLATE 4.2*  FERRITIN 68  TIBC 238*  IRON 22*   Sepsis Labs: Recent Labs  Lab 08/10/20 0520  LATICACIDVEN 1.7    Recent Results (from the past 240 hour(s))  Blood culture (routine x 2)     Status: None (Preliminary result)   Collection Time: 08/10/20  5:20 AM   Specimen: BLOOD LEFT HAND  Result Value Ref Range Status   Specimen Description   Final    BLOOD LEFT HAND Performed at [facility], [place] [address] [place], [place] [address]    Special Requests   Final    BOTTLES DRAWN AEROBIC ONLY Blood Culture results may not be optimal due to an inadequate volume of blood received in culture bottles Performed at [place] [address], [place], [place] [address]    Culture   Final    NO GROWTH 1 DAY Performed at [facility], [place] [address], [place], [place] [address]    Report Status PENDING  Incomplete   Resp Panel by RT-PCR (Flu A&B, Covid) Nasopharyngeal Swab     Status: None   Collection Time: 08/10/20  7:42 AM   Specimen: Nasopharyngeal Swab; Nasopharyngeal(NP) swabs in vial transport medium  Result Value Ref Range Status   SARS Coronavirus 2 by RT PCR NEGATIVE NEGATIVE Final    Comment: (NOTE) SARS-CoV-2 target nucleic acids are NOT DETECTED.  The SARS-CoV-2 RNA is generally detectable in upper respiratory specimens during the acute phase of infection. The lowest concentration of SARS-CoV-2 viral copies this assay can detect is 138 copies/mL. A negative result does not preclude SARS-Cov-2 infection and should not be used as the sole basis for treatment or other patient management decisions. A negative result may occur with  improper specimen collection/handling, submission of specimen other than nasopharyngeal swab, presence of viral mutation(s) within the areas targeted by this assay, and inadequate number of viral copies(<138 copies/mL). A negative result must be combined with clinical observations, patient history, and epidemiological information. The expected result is Negative.  Fact Sheet for Patients:  EntrepreneurPulse.com.au  Fact Sheet for Healthcare Providers:  IncredibleEmployment.be  This test is no t yet approved or cleared by the [place] FDA and  has been authorized for detection and/or diagnosis of SARS-CoV-2 by FDA under an Emergency Use Authorization (EUA). This EUA will remain  in effect (meaning this test can be used) for the duration of the COVID-19 declaration under Section 564(b)(1) of the Act, 21 U.S.C.section 360bbb-3(b)(1), unless the authorization is terminated  or revoked sooner.       Influenza A by PCR NEGATIVE NEGATIVE Final   Influenza B by PCR NEGATIVE NEGATIVE Final    Comment: (NOTE) The Xpert Xpress SARS-CoV-2/FLU/RSV plus assay is intended as an aid in the diagnosis of influenza from  Nasopharyngeal swab specimens and should not be used as a sole basis for treatment. Nasal washings and aspirates are unacceptable for Xpert Xpress SARS-CoV-2/FLU/RSV testing.  Fact Sheet for Patients: EntrepreneurPulse.com.au  Fact Sheet for Healthcare Providers: IncredibleEmployment.be  This test is not yet approved or cleared by the [place] FDA and has been authorized for detection and/or diagnosis of SARS-CoV-2 by FDA under an Emergency Use Authorization (EUA). This EUA will remain in effect (  meaning this test can be used) for the duration of the COVID-19 declaration under Section 564(b)(1) of the Act, 21 U.S.C. section 360bbb-3(b)(1), unless the authorization is terminated or revoked.  Performed at [facility], [place] [address], [place], [place] [address]     Radiology Studies: DG Lumbar Spine 2-3 Views  Result Date: 08/10/2020 CLINICAL DATA:  Fall from bed 08/08/2020, late on floor and fell tonight, states hips hurt, completely immobile and weighs 400 pounds EXAM: LUMBAR SPINE - 2-3 VIEW COMPARISON:  MR 11/04/2018, radiograph 06/02/2014 FINDINGS: Exam quality is significantly diminished by patient body habitus which results in for contrast resolution. Redemonstrated grade 1 anterolisthesis L5 on S1 with probable pars defects. Posterior wedging of the L5 vertebrae is noted with up to 50% height loss which is similar to most recent lumbar MR imaging. No clear acute fracture or traumatic malalignment. Additional multilevel discogenic and facet degenerative changes throughout the included thoracolumbar levels, maximal L5-S1. IMPRESSION: Suboptimal imaging quality due to body habitus. No clear acute fracture or traumatic listhesis. Chronic grade 1 anterolisthesis L5 on S1 with probable pars defects. Posterior wedging deformity L5 with 50% height loss posteriorly, unchanged from comparison. Electronically Signed   By: Lovena Le  M.D.   On: 08/10/2020 01:37   DG Pelvis 1-2 Views  Result Date: 08/10/2020 CLINICAL DATA:  Fall from bed 08/08/2020, immobile since fall EXAM: PELVIS - 1-2 VIEW COMPARISON:  Contemporary lumbar radiographs. FINDINGS: Suboptimal imaging quality due to patient body habitus. No clear acute fracture or traumatic osseous injury of the bony pelvis or proximal femora. Mild degenerative changes in the hips and pelvis. For findings in the lumbar spine, please see dedicated radiographs. IMPRESSION: Suboptimal imaging quality due to body habitus. No clear acute fracture or traumatic malalignment. Degenerative changes in the hips and pelvis. Dedicated lumbar spine imaging performed concurrently. Electronically Signed   By: Lovena Le M.D.   On: 08/10/2020 01:40   DG Chest Portable 1 View  Result Date: 08/10/2020 CLINICAL DATA:  Shortness of breath EXAM: PORTABLE CHEST 1 VIEW COMPARISON:  06/22/2018 FINDINGS: Cardiomegaly accentuated by low lung volumes. Low volume chest with indistinct perihilar interstitial markings. No Kerley lines, effusion, or pneumothorax. IMPRESSION: Limited low volume chest. Possible vascular congestion.  No edema or focal airspace disease. Electronically Signed   By: Monte Fantasia M.D.   On: 08/10/2020 06:20   DG Knee Left Port  Result Date: 08/10/2020 CLINICAL DATA:  Fall. EXAM: PORTABLE LEFT KNEE - 1-2 VIEW COMPARISON:  04/11/2016. FINDINGS: Small bony density noted adjacent to the lateral aspect of the patella. This may represent a patellar fracture fragment, age undetermined. This was not present on prior study of 04/11/2016. Patella is again high-riding. Prominent tricompartment degenerative change again noted. Evaluation for effusion is difficult due to positioning. No prominent knee joint effusion noted. IMPRESSION: 1. Small bony density noted adjacent to the lateral aspect of the patella. This may represent patellar fracture fragment, age undetermined. This was not present on prior  study of 04/11/2016. Patella is again high-riding. 2.  Prominent tricompartment degenerative change again noted. Electronically Signed   By: Marcello Moores  Register   On: 08/10/2020 06:12   DG Knee Right Port  Result Date: 08/10/2020 CLINICAL DATA:  Fall. EXAM: PORTABLE RIGHT KNEE - 1-2 VIEW COMPARISON:  06/02/2014. FINDINGS: Bipartite patella again noted. High-riding patella again noted. Tricompartment degenerative change, most prominent about the lateral compartment again noted. Interim progression of joint space loss from prior exam. No acute bony or joint  abnormality. Evaluation for effusions difficult due to positioning. Small knee joint effusion cannot be excluded. IMPRESSION: 1. Bipartite patella again noted. High-riding patella again noted. Tricompartment degenerative change, most prominent about the lateral compartment again noted. Interim progression of joint space loss from prior exam. 2. No acute bony or joint abnormality. Small knee joint effusion cannot be excluded. Electronically Signed   By: Marcello Moores  Register   On: 08/10/2020 06:21   Scheduled Meds: . buPROPion  300 mg Oral Daily  . busPIRone  20 mg Oral BID  . chlorhexidine  15 mL Mouth Rinse BID  . Chlorhexidine Gluconate Cloth  6 each Topical Daily  . cholecalciferol  5,000 Units Oral Daily  . DULoxetine  60 mg Oral Daily  . enoxaparin (LOVENOX) injection  100 mg Subcutaneous Q24H  . insulin aspart  0-5 Units Subcutaneous QHS  . insulin aspart  0-9 Units Subcutaneous TID WC  . mouth rinse  15 mL Mouth Rinse q12n4p  . montelukast  10 mg Oral QHS  . nystatin  1 application Topical TID  . QUEtiapine  25 mg Oral QHS  . rOPINIRole  4 mg Oral QHS  . vitamin B-12  5,000 mcg Oral Daily   Continuous Infusions: . sodium chloride 125 mL/hr at 08/11/20 0859  . linezolid (ZYVOX) IV       LOS: 1 day    Time spent: 35 mins   Onalee Steinbach, MD [facility]   If 7PM-7AM, please contact night-coverage

## 2020-08-11 NOTE — Evaluation (Signed)
Physical Therapy Evaluation Patient Details Name: Heather [name] MRN: [id number] DOB: [birth year] Today's Date: 08/11/2020   History of Present Illness  Patient is a 45 year old female admitted 5/4 with rhabdomyolsis after fall at home and unable to get up. PMH includes super morbid obesity with BMI of 89.46, sleep apnea untreated, chronic anemia, COPD, depression, type 2 diabetes not on treatment, restless leg syndrome, affective disorder bipolar type  Clinical Impression  Pt admitted with above diagnosis.  Pt currently with functional limitations due to the deficits listed below (see PT Problem List). Pt will benefit from skilled PT to increase their independence and safety with mobility to allow discharge to the venue listed below.  Pt with increased left shoulder pain and bil knee pain limiting mobility.  Pt also with increased body habitus and admitted with rhabdomyolysis and would require at least +3 to mobilize.  Nursing to assist with room change with maxisky.  Pt reports she was ambulatory and working from home prior to admission.  Pt also has no DME at home and lives in 3rd floor apartment.  Pt will likely need post acute rehab upon d/c.     Follow Up Recommendations SNF    Equipment Recommendations   (to further assess if home)    Recommendations for Other Services       Precautions / Restrictions Precautions Precautions: Fall Precaution Comments: multiple wounds in skin folds Restrictions Weight Bearing Restrictions: No      Mobility  Bed Mobility Overal bed mobility: Needs Assistance Bed Mobility: Rolling Rolling: Total assist (+3 or more)         General bed mobility comments: cue patient to assist with use of bed rails, patient minimally able to roll upper body. patient did little better rolling to her L side. would need multiple people to complete roll onto side    Transfers                 General transfer comment: unable, will need  lift  Ambulation/Gait                Stairs            Wheelchair Mobility    Modified Rankin (Stroke Patients Only)       Balance Overall balance assessment: History of Falls                                           Pertinent Vitals/Pain Pain Assessment: Faces Faces Pain Scale: Hurts whole lot Pain Location: bilateral knees, L shoulder Pain Descriptors / Indicators: Sharp;Grimacing;Moaning Pain Intervention(s): Monitored during session;Repositioned    Home Living Family/patient expects to be discharged to:: Private residence Living Arrangements: Parent;Other (Comment) (2 young children) Available Help at Discharge: Family Type of Home: Apartment Home Access: Stairs to enter   [employer] of Steps: 3 full flights Home Layout: One level Home Equipment: None      Prior Function Level of Independence: Independent         Comments: 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 Patient reports now sponge bathing and very limited activity especially past few days difficulty with ambulation     Hand Dominance   Dominant Hand: Left    Extremity/Trunk Assessment   Upper Extremity  Assessment Upper Extremity Assessment: Overall WFL for tasks assessed    Lower Extremity Assessment Lower Extremity Assessment: Generalized weakness;RLE deficits/detail;LLE deficits/detail RLE Deficits / Details: right knee pain limiting, unable to perform AROM due to knee pain, limited AAROM due to pain LLE Deficits / Details: grossly approx 75% AROM limited by knee pain and body habitus    Cervical / Trunk Assessment Cervical / Trunk Assessment: Other exceptions Cervical / Trunk Exceptions: body habitus  Communication   Communication: No difficulties  Cognition Arousal/Alertness: Awake/alert Behavior During Therapy:  WFL for tasks assessed/performed Overall Cognitive Status: Within Functional Limits for tasks assessed                                        General Comments General comments (skin integrity, edema, etc.): multiple sites of skin break down in body folds    Exercises     Assessment/Plan    PT Assessment Patient needs continued PT services  PT Problem List Decreased strength;Decreased mobility;Decreased activity tolerance;Decreased knowledge of use of DME;Obesity       PT Treatment Interventions Gait training;DME instruction;Therapeutic exercise;Functional mobility training;Therapeutic activities;Patient/family education    PT Goals (Current goals can be found in the Care Plan section)  Acute Rehab PT Goals Patient Stated Goal: "I don't know what happened to me" PT Goal Formulation: With patient Time For Goal Achievement: 08/25/20 Potential to Achieve Goals: Good    Frequency Min 2X/week   Barriers to discharge Decreased caregiver support;Inaccessible home environment      Co-evaluation PT/OT/SLP Co-Evaluation/Treatment: Yes Reason for Co-Treatment: For patient/therapist safety;To address functional/ADL transfers PT goals addressed during session: Mobility/safety with mobility OT goals addressed during session: ADL's and self-care;Strengthening/ROM       AM-PAC PT "6 Clicks" Mobility  Outcome Measure Help needed turning from your back to your side while in a flat bed without using bedrails?: Total Help needed moving from lying on your back to sitting on the side of a flat bed without using bedrails?: Total Help needed moving to and from a bed to a chair (including a wheelchair)?: Total Help needed standing up from a chair using your arms (e.g., wheelchair or bedside chair)?: Total Help needed to walk in hospital room?: Total Help needed climbing 3-5 steps with a railing? : Total 6 Click Score: 6    End of Session   Activity Tolerance: Patient  limited by pain Patient left: in bed;with call bell/phone within reach Nurse Communication: Mobility status;Need for lift equipment PT Visit Diagnosis: Difficulty in walking, not elsewhere classified (R26.2);Muscle weakness (generalized) (M62.81)    Time: 7824-2353 PT Time Calculation (min) (ACUTE ONLY): 29 min   Charges:   PT Evaluation $PT Eval Low Complexity: 1 Low     Kati PT, DPT Acute Rehabilitation Services Pager: [phone] Office: [phone]  York Ram E 08/11/2020, 3:14 PM

## 2020-08-12 LAB — GLUCOSE, CAPILLARY
Glucose-Capillary: 81 mg/dL (ref 70–99)
Glucose-Capillary: 101 mg/dL — ABNORMAL HIGH (ref 70–99)
Glucose-Capillary: 78 mg/dL (ref 70–99)
Glucose-Capillary: 90 mg/dL (ref 70–99)

## 2020-08-12 LAB — CBC
HCT: 30.7 % — ABNORMAL LOW (ref 36.0–46.0)
Hemoglobin: 8.2 g/dL — ABNORMAL LOW (ref 12.0–15.0)
MCH: 21.4 pg — ABNORMAL LOW (ref 26.0–34.0)
MCHC: 26.7 g/dL — ABNORMAL LOW (ref 30.0–36.0)
MCV: 80.2 fL (ref 80.0–100.0)
Platelets: 549 10*3/uL — ABNORMAL HIGH (ref 150–400)
RBC: 3.83 MIL/uL — ABNORMAL LOW (ref 3.87–5.11)
RDW: 22.2 % — ABNORMAL HIGH (ref 11.5–15.5)
WBC: 10.8 10*3/uL — ABNORMAL HIGH (ref 4.0–10.5)
nRBC: 0 % (ref 0.0–0.2)

## 2020-08-12 LAB — COMPREHENSIVE METABOLIC PANEL
ALT: 54 U/L — ABNORMAL HIGH (ref 0–44)
AST: 90 U/L — ABNORMAL HIGH (ref 15–41)
Albumin: 2.4 g/dL — ABNORMAL LOW (ref 3.5–5.0)
Alkaline Phosphatase: 92 U/L (ref 38–126)
Anion gap: 11 (ref 5–15)
BUN: 33 mg/dL — ABNORMAL HIGH (ref 6–20)
CO2: 22 mmol/L (ref 22–32)
Calcium: 9 mg/dL (ref 8.9–10.3)
Chloride: 116 mmol/L — ABNORMAL HIGH (ref 98–111)
Creatinine, Ser: 2.85 mg/dL — ABNORMAL HIGH (ref 0.44–1.00)
GFR, Estimated: 21 mL/min — ABNORMAL LOW (ref >60)
Glucose, Bld: 81 mg/dL (ref 70–99)
Potassium: 3.9 mmol/L (ref 3.5–5.1)
Sodium: 149 mmol/L — ABNORMAL HIGH (ref 135–145)
Total Bilirubin: 0.2 mg/dL — ABNORMAL LOW (ref 0.3–1.2)
Total Protein: 7.8 g/dL (ref 6.5–8.1)

## 2020-08-12 LAB — PHOSPHORUS: Phosphorus: 3.3 mg/dL (ref 2.5–4.6)

## 2020-08-12 LAB — MAGNESIUM: Magnesium: 2.2 mg/dL (ref 1.7–2.4)

## 2020-08-12 LAB — CK: Total CK: 946 U/L — ABNORMAL HIGH (ref 38–234)

## 2020-08-12 MED ORDER — SODIUM CHLORIDE 0.9 % IV SOLN
2.0000 g | INTRAVENOUS | Status: DC
  Administered 2020-08-12 – 2020-08-13 (×2): 2 g via INTRAVENOUS
  Filled 2020-08-12: qty 2
  Filled 2020-08-12: qty 20
  Filled 2020-08-12: qty 2

## 2020-08-12 MED ORDER — ZINC OXIDE 40 % EX OINT
TOPICAL_OINTMENT | CUTANEOUS | Status: DC | PRN
  Filled 2020-08-12 (×2): qty 57
  Filled 2020-08-12: qty 114

## 2020-08-12 NOTE — Progress Notes (Signed)
   08/12/20 0000  Integumentary  Integumentary (WDL) X   Integumentary (WDL) X  Lesions under panus with weeping white/tan/yellow drainage very painful to clean. Malodorous. Other lesions noted under skin folds on back.  Broken skin noted under bilateral breasts.  All sites cleaned with soap and water. Nystatin powder applied to pannus.  Scant amount of bleeding noted. Pt did express that she did not want it to be cleaned but told her that it must be done in order to decrease increasing infection.

## 2020-08-12 NOTE — Progress Notes (Signed)
PROGRESS NOTE    Heather [name]  UMP:[id number] DOB: 03/07/1976 DOA: 08/09/2020 PCP: Lucianne Lei, MD   Brief Narrative:  This 45 years old morbidly obese female with multiple medical comorbidities, with BMI of 76, sleep apnea untreated, chronic anemia, COPD, depression, type 2 diabetes not on treatment, ambulatory dysfunction, restless leg syndrome, affective  bipolar disorder presents in the ED as her mom called EMS because she fell on the floor and could not get up for 48 hours.  Patient has developed rashes on her groin and painful lesions on her pannus.  She denies any fevers but reports some hip pain, she was recently prescribed Bactrim for rashes on on the pannus. In the ED WBC 19 K, hemoglobin 8.3, creatinine 4.18, Skeletal survey was completely unremarkable.  CT abdomen and pelvis is pending.  Patient was given IV fluids, vancomycin for panniculitis.  EMS reported that her house was very unhygienic with insects and bugs. Patient is admitted for acute kidney injury, traumatic rhabdomyolysis and panniculitis.   Assessment & Plan:   Principal Problem:   Acute renal failure (HCC) Active Problems:   Morbid obesity (HCC)   Leukocytosis   Essential hypertension   GERD (gastroesophageal reflux disease)   Cellulitis, abdominal wall   DM type 2 (diabetes mellitus, type 2) (HCC)   OSA (obstructive sleep apnea)   Rhabdomyolysis   Pressure injury of skin  Acute kidney injury could be multifactorial. Probably prerenal with rhabdomyolysis and dehydration. Recently used Bactrim for panniculitis. We will aggressively hydrate with IV fluids. Monitor intake and output charting. If no adequate improvement in renal functions, will consider nephro consult. CT abdomen and pelvis is pending to rule out hydronephrosis. Avoid nephrotoxic medications, continue to trend serum creatinine. Creatinine trending down  4.18> 3.36>2.85  Traumatic rhabdomyolysis: She was lying on the floor for long  time.  CK elevated 2291 > 1226 >946 Continue IV hydration, continue to trend CK.  Panniculitis/  Leukocytosis: She has significant skin inflammation and ulceration. Will treat with vancomycin.  Local wound care/ wound care consult. Discussed with pharmacy vancomycin changed to linezolid. Check H&P pictures.  Morbid obesity/sleep apnea : Patient does not use CPAP at home. Monitor clinical condition.  Essential hypertension:   Blood pressure fairly stable. She is not taking any blood pressure medications.  Anxiety/depression/restless leg syndrome.   Continue home medications Wellbutrin, BuSpar, Seroquel and Requip.  UTI:  UA consistent with UTI. Start ceftriaxone 1 g daily, urine culture grew gram-negative rods. Sensitivity pending.  Safe disposition:   Patient with mobility problems, she could not get up from the floor for 2 days and has to 39-year old children at home. Please consult case management for safety evaluation.  PT and OT evaluation.   Chronic anemia :   There is no evidence of active bleeding. check iron profile, Q00 and folic acid.   DVT prophylaxis: Heparin subcu Code Status: Full code Family Communication: No family @ bedside  Disposition Plan:  Status is: Inpatient  Remains inpatient appropriate because:Inpatient level of care appropriate due to severity of illness   Dispo: The patient is from: Home              Anticipated d/c is to: Home in 1-2 days              Patient currently is not medically stable to d/c.   Difficult to place patient No   Consultants:    None  Procedures: CT abdomen and pelvis pending Antimicrobials:  Anti-infectives (From admission, onward)   Start     Dose/Rate Route Frequency Ordered Stop   08/11/20 1400  linezolid (ZYVOX) IVPB 600 mg        600 mg 300 mL/hr over 60 Minutes Intravenous Every 12 hours 08/11/20 1255     08/10/20 0620  vancomycin variable dose per unstable renal function (pharmacist dosing)   Status:  Discontinued         Does not apply See admin instructions 08/10/20 0620 08/11/20 1252   08/10/20 0600  vancomycin (VANCOREADY) IVPB 2000 mg/400 mL        2,000 mg 100 mL/hr over 240 Minutes Intravenous  Once 08/10/20 0547 08/10/20 1626      Subjective: Patient was seen and examined at bedside.  No overnight events.  She looks comfortable , morbidly obese,  denies any pain. She has significant skin lesions and ulcerations noted on the pannus area.  Objective: Vitals:   08/11/20 1237 08/11/20 2032 08/12/20 0433 08/12/20 1251  BP: 128/70 (!) 132/54 125/62 121/68  Pulse: (!) 102 (!) 104 86 (!) 104  Resp: 18 19 16 19   Temp: 99.6 F (37.6 C) 99.2 F (37.3 C) 100.1 F (37.8 C) 99 F (37.2 C)  TempSrc: Oral Oral Oral Oral  SpO2: 94% 93% 100% 96%  Weight:      Height:        Intake/Output Summary (Last 24 hours) at 08/12/2020 1321 Last data filed at 08/12/2020 0600 Gross per 24 hour  Intake 480 ml  Output 1400 ml  Net -920 ml   Filed Weights   08/10/20 1752 08/11/20 0454 08/11/20 0932  Weight: (!) 230 kg (!) 230 kg (!) 229.1 kg    Examination:  General exam: Appears calm and comfortable, not in any acute distress. Respiratory system: Clear to auscultation. Respiratory effort normal. Cardiovascular system: S1 & S2 heard, RRR. No JVD, murmurs, rubs, gallops or clicks. No pedal edema. Gastrointestinal system: Abdomen is nondistended, soft and nontender. No organomegaly or masses felt.  Normal bowel sounds heard. Central nervous system: Alert and oriented X3. No focal neurological deficits. Extremities: No edema, no cyanosis, no clubbing. Skin: Multiple skin excoriations are noted in the pannus area. Psychiatry: Judgement and insight appear normal. Mood & affect appropriate.     Data Reviewed: I have personally reviewed following labs and imaging studies  CBC: Recent Labs  Lab 08/10/20 0506 08/11/20 0321 08/12/20 0409  WBC 19.1* 13.6* 10.8*  NEUTROABS 15.2*  --    --   HGB 8.3* 8.0* 8.2*  HCT 28.9* 29.0* 30.7*  MCV 76.3* 78.4* 80.2  PLT 607* 559* 924*   Basic Metabolic Panel: Recent Labs  Lab 08/10/20 0506 08/11/20 0321 08/12/20 0409  NA 143 142 149*  K 4.4 3.9 3.9  CL 108 110 116*  CO2 21* 21* 22  GLUCOSE 90 76 81  BUN 47* 42* 33*  CREATININE 4.18* 3.36* 2.85*  CALCIUM 9.0 8.8* 9.0  MG  --  2.2 2.2  PHOS  --  4.1 3.3   GFR: Estimated Creatinine Clearance: 50 mL/min (A) (by C-G formula based on SCr of 2.85 mg/dL (H)). Liver Function Tests: Recent Labs  Lab 08/10/20 0506 08/12/20 0409  AST 117* 90*  ALT 48* 54*  ALKPHOS 98 92  BILITOT 0.4 0.2*  PROT 7.7 7.8  ALBUMIN 2.4* 2.4*   No results for input(s): LIPASE, AMYLASE in the last 168 hours. No results for input(s): AMMONIA in the last 168 hours. Coagulation Profile: No results for  input(s): INR, PROTIME in the last 168 hours. Cardiac Enzymes: Recent Labs  Lab 08/10/20 0506 08/11/20 0321 08/12/20 0409  CKTOTAL 2,291* 1,226* 946*   BNP (last 3 results) No results for input(s): PROBNP in the last 8760 hours. HbA1C: Recent Labs    08/10/20 0506  HGBA1C 5.6   CBG: Recent Labs  Lab 08/11/20 1247 08/11/20 1619 08/11/20 2034 08/12/20 0739 08/12/20 1135  GLUCAP 84 78 94 81 101*   Lipid Profile: No results for input(s): CHOL, HDL, LDLCALC, TRIG, CHOLHDL, LDLDIRECT in the last 72 hours. Thyroid Function Tests: No results for input(s): TSH, T4TOTAL, FREET4, T3FREE, THYROIDAB in the last 72 hours. Anemia Panel: Recent Labs    08/10/20 1119  VITAMINB12 1,303*  FOLATE 4.2*  FERRITIN 68  TIBC 238*  IRON 22*   Sepsis Labs: Recent Labs  Lab 08/10/20 0520  LATICACIDVEN 1.7    Recent Results (from the past 240 hour(s))  Blood culture (routine x 2)     Status: None (Preliminary result)   Collection Time: 08/10/20  5:20 AM   Specimen: BLOOD LEFT HAND  Result Value Ref Range Status   Specimen Description   Final    BLOOD LEFT HAND Performed at [facility], [place] [address] [place], [place] [address]    Special Requests   Final    BOTTLES DRAWN AEROBIC ONLY Blood Culture results may not be optimal due to an inadequate volume of blood received in culture bottles Performed at [place] [address], [place], [place] [address]    Culture   Final    NO GROWTH 2 DAYS Performed at [place] [address], [place], [place] [address]    Report Status PENDING  Incomplete  Resp Panel by RT-PCR (Flu A&B, Covid) Nasopharyngeal Swab     Status: None   Collection Time: 08/10/20  7:42 AM   Specimen: Nasopharyngeal Swab; Nasopharyngeal(NP) swabs in vial transport medium  Result Value Ref Range Status   SARS Coronavirus 2 by RT PCR NEGATIVE NEGATIVE Final    Comment: (NOTE) SARS-CoV-2 target nucleic acids are NOT DETECTED.  The SARS-CoV-2 RNA is generally detectable in upper respiratory specimens during the acute phase of infection. The lowest concentration of SARS-CoV-2 viral copies this assay can detect is 138 copies/mL. A negative result does not preclude SARS-Cov-2 infection and should not be used as the sole basis for treatment or other patient management decisions. A negative result may occur with  improper specimen collection/handling, submission of specimen other than nasopharyngeal swab, presence of viral mutation(s) within the areas targeted by this assay, and inadequate number of viral copies(<138 copies/mL). A negative result must be combined with clinical observations, patient history, and epidemiological information. The expected result is Negative.  Fact Sheet for Patients:  EntrepreneurPulse.com.au  Fact Sheet for Healthcare Providers:  IncredibleEmployment.be  This test is no t yet approved or cleared by the [place] FDA and  has been authorized for detection and/or diagnosis of SARS-CoV-2 by FDA under an Emergency Use Authorization  (EUA). This EUA will remain  in effect (meaning this test can be used) for the duration of the COVID-19 declaration under Section 564(b)(1) of the Act, 21 U.S.C.section 360bbb-3(b)(1), unless the authorization is terminated  or revoked sooner.       Influenza A by PCR NEGATIVE NEGATIVE Final   Influenza B by PCR NEGATIVE NEGATIVE Final    Comment: (NOTE) The Xpert Xpress SARS-CoV-2/FLU/RSV plus assay is intended as an aid  in the diagnosis of influenza from Nasopharyngeal swab specimens and should not be used as a sole basis for treatment. Nasal washings and aspirates are unacceptable for Xpert Xpress SARS-CoV-2/FLU/RSV testing.  Fact Sheet for Patients: EntrepreneurPulse.com.au  Fact Sheet for Healthcare Providers: IncredibleEmployment.be  This test is not yet approved or cleared by the [place] FDA and has been authorized for detection and/or diagnosis of SARS-CoV-2 by FDA under an Emergency Use Authorization (EUA). This EUA will remain in effect (meaning this test can be used) for the duration of the COVID-19 declaration under Section 564(b)(1) of the Act, 21 U.S.C. section 360bbb-3(b)(1), unless the authorization is terminated or revoked.  Performed at [facility], [place] [address], [place], [place] [address]   Culture, Urine     Status: Abnormal (Preliminary result)   Collection Time: 08/11/20  9:37 AM   Specimen: Urine, Catheterized  Result Value Ref Range Status   Specimen Description   Final    URINE, CATHETERIZED Performed at [facility], [place] [address], [place], [place] [address]    Special Requests   Final    Normal Performed at [facility], [place] [address], [place],  [address]    Culture >=100,000 COLONIES/mL GRAM NEGATIVE RODS (A)  Final   Report Status PENDING  Incomplete    Radiology Studies: No results found. Scheduled Meds: . buPROPion  300 mg  Oral Daily  . busPIRone  20 mg Oral BID  . chlorhexidine  15 mL Mouth Rinse BID  . Chlorhexidine Gluconate Cloth  6 each Topical Daily  . cholecalciferol  5,000 Units Oral Daily  . DULoxetine  60 mg Oral Daily  . enoxaparin (LOVENOX) injection  100 mg Subcutaneous Q24H  . insulin aspart  0-5 Units Subcutaneous QHS  . insulin aspart  0-9 Units Subcutaneous TID WC  . mouth rinse  15 mL Mouth Rinse q12n4p  . montelukast  10 mg Oral QHS  . nystatin  1 application Topical TID  . QUEtiapine  25 mg Oral QHS  . rOPINIRole  4 mg Oral QHS  . vitamin B-12  5,000 mcg Oral Daily   Continuous Infusions: . sodium chloride 125 mL/hr at 08/12/20 0455  . linezolid (ZYVOX) IV 600 mg (08/12/20 0134)     LOS: 2 days    Time spent: 35 mins   Carlette Palmatier, MD [facility]   If 7PM-7AM, please contact night-coverage

## 2020-08-12 NOTE — Plan of Care (Signed)

## 2020-08-13 ENCOUNTER — Encounter (HOSPITAL_COMMUNITY): Payer: Self-pay | Admitting: Family Medicine

## 2020-08-13 DIAGNOSIS — N179 Acute kidney failure, unspecified: Secondary | ICD-10-CM

## 2020-08-13 LAB — CBC
HCT: 27.1 % — ABNORMAL LOW (ref 36.0–46.0)
HCT: 28.9 % — ABNORMAL LOW (ref 36.0–46.0)
Hemoglobin: 7.2 g/dL — ABNORMAL LOW (ref 12.0–15.0)
Hemoglobin: 7.8 g/dL — ABNORMAL LOW (ref 12.0–15.0)
MCH: 21.5 pg — ABNORMAL LOW (ref 26.0–34.0)
MCH: 22 pg — ABNORMAL LOW (ref 26.0–34.0)
MCHC: 26.6 g/dL — ABNORMAL LOW (ref 30.0–36.0)
MCHC: 27 g/dL — ABNORMAL LOW (ref 30.0–36.0)
MCV: 80.9 fL (ref 80.0–100.0)
MCV: 81.4 fL (ref 80.0–100.0)
Platelets: 430 10*3/uL — ABNORMAL HIGH (ref 150–400)
Platelets: 435 10*3/uL — ABNORMAL HIGH (ref 150–400)
RBC: 3.35 MIL/uL — ABNORMAL LOW (ref 3.87–5.11)
RBC: 3.55 MIL/uL — ABNORMAL LOW (ref 3.87–5.11)
RDW: 22.6 % — ABNORMAL HIGH (ref 11.5–15.5)
RDW: 22.5 % — ABNORMAL HIGH (ref 11.5–15.5)
WBC: 9.6 10*3/uL (ref 4.0–10.5)
WBC: 9.4 10*3/uL (ref 4.0–10.5)
nRBC: 0 % (ref 0.0–0.2)
nRBC: 0 % (ref 0.0–0.2)

## 2020-08-13 LAB — BASIC METABOLIC PANEL
Anion gap: 8 (ref 5–15)
BUN: 26 mg/dL — ABNORMAL HIGH (ref 6–20)
CO2: 21 mmol/L — ABNORMAL LOW (ref 22–32)
Calcium: 8.2 mg/dL — ABNORMAL LOW (ref 8.9–10.3)
Chloride: 116 mmol/L — ABNORMAL HIGH (ref 98–111)
Creatinine, Ser: 2.27 mg/dL — ABNORMAL HIGH (ref 0.44–1.00)
GFR, Estimated: 27 mL/min — ABNORMAL LOW (ref >60)
Glucose, Bld: 76 mg/dL (ref 70–99)
Potassium: 3.6 mmol/L (ref 3.5–5.1)
Sodium: 145 mmol/L (ref 135–145)

## 2020-08-13 LAB — URINE CULTURE
Culture: >=100000 — AB
Special Requests: NORMAL

## 2020-08-13 LAB — MAGNESIUM: Magnesium: 1.8 mg/dL (ref 1.7–2.4)

## 2020-08-13 LAB — GLUCOSE, CAPILLARY
Glucose-Capillary: 64 mg/dL — ABNORMAL LOW (ref 70–99)
Glucose-Capillary: 87 mg/dL (ref 70–99)
Glucose-Capillary: 82 mg/dL (ref 70–99)
Glucose-Capillary: 106 mg/dL — ABNORMAL HIGH (ref 70–99)

## 2020-08-13 LAB — PHOSPHORUS: Phosphorus: 2.4 mg/dL — ABNORMAL LOW (ref 2.5–4.6)

## 2020-08-13 LAB — CK: Total CK: 377 U/L — ABNORMAL HIGH (ref 38–234)

## 2020-08-13 NOTE — Progress Notes (Signed)
Progress Note    Heather [name]  XKG:[id number] DOB: 29-Jul-1975  DOA: 08/09/2020 PCP: Lucianne Lei, MD      Brief Narrative:    Medical records reviewed and are as summarized below:  Heather [name] is a 45 y.o. female       Assessment/Plan:   Principal Problem:   Acute renal failure ([place]) Active Problems:   Morbid obesity (HCC)   Leukocytosis   Essential hypertension   GERD (gastroesophageal reflux disease)   Cellulitis, abdominal wall   DM type 2 (diabetes mellitus, type 2) (HCC)   OSA (obstructive sleep apnea)   Rhabdomyolysis   Pressure injury of skin  Body mass index is 89.46 kg/m.  (Morbid obesity)  Acute kidney injury: Continue IV fluids and monitor BMP  Traumatic rhabdomyolysis: CK level is improving.  Panniculitis and Proteus mirabilis UTI: Continue empiric IV antibiotics  Other comorbidities include hypertension, anxiety, depression, restless leg syndrome, chronic anemia  Follow-up with social worker to assist with disposition.  Diet Order            Diet Heart Room service appropriate? Yes; Fluid consistency: Thin  Diet effective now                    Consultants:  None  Procedures:  None    Medications:   . buPROPion  300 mg Oral Daily  . busPIRone  20 mg Oral BID  . chlorhexidine  15 mL Mouth Rinse BID  . Chlorhexidine Gluconate Cloth  6 each Topical Daily  . cholecalciferol  5,000 Units Oral Daily  . DULoxetine  60 mg Oral Daily  . enoxaparin (LOVENOX) injection  100 mg Subcutaneous Q24H  . insulin aspart  0-5 Units Subcutaneous QHS  . insulin aspart  0-9 Units Subcutaneous TID WC  . mouth rinse  15 mL Mouth Rinse q12n4p  . montelukast  10 mg Oral QHS  . nystatin  1 application Topical TID  . QUEtiapine  25 mg Oral QHS  . rOPINIRole  4 mg Oral QHS  . vitamin B-12  5,000 mcg Oral Daily   Continuous Infusions: . sodium chloride 125 mL/hr at 08/13/20 0903  . cefTRIAXone (ROCEPHIN)  IV 2 g (08/12/20 1401)   . linezolid (ZYVOX) IV 600 mg (08/13/20 0119)     Anti-infectives (From admission, onward)   Start     Dose/Rate Route Frequency Ordered Stop   08/12/20 1400  cefTRIAXone (ROCEPHIN) 2 g in sodium chloride 0.9 % 100 mL IVPB        2 g 200 mL/hr over 30 Minutes Intravenous Every 24 hours 08/12/20 1326     08/11/20 1400  linezolid (ZYVOX) IVPB 600 mg        600 mg 300 mL/hr over 60 Minutes Intravenous Every 12 hours 08/11/20 1255     08/10/20 0620  vancomycin variable dose per unstable renal function (pharmacist dosing)  Status:  Discontinued         Does not apply See admin instructions 08/10/20 0620 08/11/20 1252   08/10/20 0600  vancomycin (VANCOREADY) IVPB 2000 mg/400 mL        2,000 mg 100 mL/hr over 240 Minutes Intravenous  Once 08/10/20 0547 08/10/20 1626             Family Communication/Anticipated D/C date and plan/Code Status   DVT prophylaxis:      Code Status: Full Code  Family Communication: None Disposition Plan:    Status is: Inpatient  Remains  inpatient appropriate because:Unsafe d/c plan   Dispo: The patient is from: Home              Anticipated d/c is to: Home              Patient currently is not medically stable to d/c.   Difficult to place patient No           Subjective:   Interval events noted.  She complains of generalized weakness.  Objective:    Vitals:   08/12/20 1251 08/12/20 2029 08/13/20 0447 08/13/20 1323  BP: 121/68 128/69 97/81 (!) 123/59  Pulse: (!) 104 99 89 95  Resp: 19 18  19   Temp: 99 F (37.2 C) 98.2 F (36.8 C) 98.2 F (36.8 C) 98.9 F (37.2 C)  TempSrc: Oral Oral Oral Oral  SpO2: 96% 96% 99% 100%  Weight:      Height:       No data found.   Intake/Output Summary (Last 24 hours) at 08/13/2020 1329 Last data filed at 08/13/2020 0409 Gross per 24 hour  Intake --  Output 1000 ml  Net -1000 ml   Filed Weights   08/10/20 1752 08/11/20 0454 08/11/20 0932  Weight: (!) 230 kg (!) 230 kg (!) 229.1 kg     Exam:  GEN: NAD SKIN: Multiple superficial ulcers on the lower abdomen/suprapubic area EYES: EOMI ENT: MMM CV: RRR PULM: CTA B ABD: soft, obese with large pannus, NT, +BS CNS: AAO x 3, non focal EXT: No edema or tenderness    Pressure Injury Anterior (Active)     Location:   Location Orientation: Anterior  Staging:   Wound Description (Comments):   Present on Admission:      Pressure Injury 08/10/20 Hip Anterior;Right;Left;Lower Stage 3 -  Full thickness tissue loss. Subcutaneous fat may be visible but bone, tendon or muscle are NOT exposed. open stage 2-3 across lower abd (Active)  08/10/20 2100  Location: Hip (noted panus and lower ABD with pustul;ar lesions from hip to hip)  Location Orientation: Anterior;Right;Left;Lower  Staging: Stage 3 -  Full thickness tissue loss. Subcutaneous fat may be visible but bone, tendon or muscle are NOT exposed.  Wound Description (Comments): open stage 2-3 across lower abd  Present on Admission: Yes     Pressure Injury 08/10/20 Knee Posterior;Right Stage 2 -  Partial thickness loss of dermis presenting as a shallow open injury with a red, pink wound bed without slough. (Active)  08/10/20 2100  Location: Knee  Location Orientation: Posterior;Right  Staging: Stage 2 -  Partial thickness loss of dermis presenting as a shallow open injury with a red, pink wound bed without slough.  Wound Description (Comments):   Present on Admission: Yes     Pressure Injury 08/10/20 Knee Left;Posterior Stage 2 -  Partial thickness loss of dermis presenting as a shallow open injury with a red, pink wound bed without slough. (Active)  08/10/20 2100  Location: Knee  Location Orientation: Left;Posterior  Staging: Stage 2 -  Partial thickness loss of dermis presenting as a shallow open injury with a red, pink wound bed without slough.  Wound Description (Comments):   Present on Admission: Yes     Pressure Injury 08/10/20 Perineum  Anterior;Right;Left;Lower;Bilateral Stage 3 -  Full thickness tissue loss. Subcutaneous fat may be visible but bone, tendon or muscle are NOT exposed. (Active)  08/10/20 2100  Location: Perineum  Location Orientation: Anterior;Right;Left;Lower;Bilateral  Staging: Stage 3 -  Full thickness tissue loss. Subcutaneous fat  may be visible but bone, tendon or muscle are NOT exposed.  Wound Description (Comments):   Present on Admission: Yes     Data Reviewed:   I have personally reviewed following labs and imaging studies:  Labs: Labs show the following:   Basic Metabolic Panel: Recent Labs  Lab 08/10/20 0506 08/11/20 0321 08/12/20 0409 08/13/20 0732  NA 143 142 149* 145  K 4.4 3.9 3.9 3.6  CL 108 110 116* 116*  CO2 21* 21* 22 21*  GLUCOSE 90 76 81 76  BUN 47* 42* 33* 26*  CREATININE 4.18* 3.36* 2.85* 2.27*  CALCIUM 9.0 8.8* 9.0 8.2*  MG  --  2.2 2.2 1.8  PHOS  --  4.1 3.3 2.4*   GFR Estimated Creatinine Clearance: 62.7 mL/min (A) (by C-G formula based on SCr of 2.27 mg/dL (H)). Liver Function Tests: Recent Labs  Lab 08/10/20 0506 08/12/20 0409  AST 117* 90*  ALT 48* 54*  ALKPHOS 98 92  BILITOT 0.4 0.2*  PROT 7.7 7.8  ALBUMIN 2.4* 2.4*   No results for input(s): LIPASE, AMYLASE in the last 168 hours. No results for input(s): AMMONIA in the last 168 hours. Coagulation profile No results for input(s): INR, PROTIME in the last 168 hours.  CBC: Recent Labs  Lab 08/10/20 0506 08/11/20 0321 08/12/20 0409 08/13/20 0732  WBC 19.1* 13.6* 10.8* 9.6  NEUTROABS 15.2*  --   --   --   HGB 8.3* 8.0* 8.2* 7.2*  HCT 28.9* 29.0* 30.7* 27.1*  MCV 76.3* 78.4* 80.2 80.9  PLT 607* 559* 549* 430*   Cardiac Enzymes: Recent Labs  Lab 08/10/20 0506 08/11/20 0321 08/12/20 0409 08/13/20 0732  CKTOTAL 2,291* 1,226* 946* 377*   BNP (last 3 results) No results for input(s): PROBNP in the last 8760 hours. CBG: Recent Labs  Lab 08/12/20 1647 08/12/20 2030 08/13/20 0813  08/13/20 0849 08/13/20 1118  GLUCAP 78 90 64* 87 82   D-Dimer: No results for input(s): DDIMER in the last 72 hours. Hgb A1c: No results for input(s): HGBA1C in the last 72 hours. Lipid Profile: No results for input(s): CHOL, HDL, LDLCALC, TRIG, CHOLHDL, LDLDIRECT in the last 72 hours. Thyroid function studies: No results for input(s): TSH, T4TOTAL, T3FREE, THYROIDAB in the last 72 hours.  Invalid input(s): FREET3 Anemia work up: No results for input(s): VITAMINB12, FOLATE, FERRITIN, TIBC, IRON, RETICCTPCT in the last 72 hours. Sepsis Labs: Recent Labs  Lab 08/10/20 0506 08/10/20 0520 08/11/20 0321 08/12/20 0409 08/13/20 0732  WBC 19.1*  --  13.6* 10.8* 9.6  LATICACIDVEN  --  1.7  --   --   --     Microbiology Recent Results (from the past 240 hour(s))  Blood culture (routine x 2)     Status: None (Preliminary result)   Collection Time: 08/10/20  5:20 AM   Specimen: BLOOD LEFT HAND  Result Value Ref Range Status   Specimen Description   Final    BLOOD LEFT HAND Performed at [facility], [place] [address] [place], [place] [address]    Special Requests   Final    BOTTLES DRAWN AEROBIC ONLY Blood Culture results may not be optimal due to an inadequate volume of blood received in culture bottles Performed at [facility] [address], [place], [place] [address]    Culture   Final    NO GROWTH 3 DAYS Performed at [facility], [place] [address], [place], [place] [address]    Report  Status PENDING  Incomplete  Resp Panel by RT-PCR (Flu A&B, Covid) Nasopharyngeal Swab     Status: None   Collection Time: 08/10/20  7:42 AM   Specimen: Nasopharyngeal Swab; Nasopharyngeal(NP) swabs in vial transport medium  Result Value Ref Range Status   SARS Coronavirus 2 by RT PCR NEGATIVE NEGATIVE Final    Comment: (NOTE) SARS-CoV-2 target nucleic acids are NOT DETECTED.  The SARS-CoV-2 RNA is generally detectable in upper  respiratory specimens during the acute phase of infection. The lowest concentration of SARS-CoV-2 viral copies this assay can detect is 138 copies/mL. A negative result does not preclude SARS-Cov-2 infection and should not be used as the sole basis for treatment or other patient management decisions. A negative result may occur with  improper specimen collection/handling, submission of specimen other than nasopharyngeal swab, presence of viral mutation(s) within the areas targeted by this assay, and inadequate number of viral copies(<138 copies/mL). A negative result must be combined with clinical observations, patient history, and epidemiological information. The expected result is Negative.  Fact Sheet for Patients:  EntrepreneurPulse.com.au  Fact Sheet for Healthcare Providers:  IncredibleEmployment.be  This test is no t yet approved or cleared by the [place] FDA and  has been authorized for detection and/or diagnosis of SARS-CoV-2 by FDA under an Emergency Use Authorization (EUA). This EUA will remain  in effect (meaning this test can be used) for the duration of the COVID-19 declaration under Section 564(b)(1) of the Act, 21 U.S.C.section 360bbb-3(b)(1), unless the authorization is terminated  or revoked sooner.       Influenza A by PCR NEGATIVE NEGATIVE Final   Influenza B by PCR NEGATIVE NEGATIVE Final    Comment: (NOTE) The Xpert Xpress SARS-CoV-2/FLU/RSV plus assay is intended as an aid in the diagnosis of influenza from Nasopharyngeal swab specimens and should not be used as a sole basis for treatment. Nasal washings and aspirates are unacceptable for Xpert Xpress SARS-CoV-2/FLU/RSV testing.  Fact Sheet for Patients: EntrepreneurPulse.com.au  Fact Sheet for Healthcare Providers: IncredibleEmployment.be  This test is not yet approved or cleared by the [place] FDA and has been  authorized for detection and/or diagnosis of SARS-CoV-2 by FDA under an Emergency Use Authorization (EUA). This EUA will remain in effect (meaning this test can be used) for the duration of the COVID-19 declaration under Section 564(b)(1) of the Act, 21 U.S.C. section 360bbb-3(b)(1), unless the authorization is terminated or revoked.  Performed at [facility], [place] [address] [place], [place] [address]   Culture, Urine     Status: Abnormal   Collection Time: 08/11/20  9:37 AM   Specimen: Urine, Catheterized  Result Value Ref Range Status   Specimen Description   Final    URINE, CATHETERIZED Performed at [facility] [address], [place], [place] [address]    Special Requests   Final    Normal Performed at [facility], [place] [address], [place], [place] [address]    Culture >=100,000 COLONIES/mL PROTEUS MIRABILIS (A)  Final   Report Status 08/13/2020 FINAL  Final   Organism ID, Bacteria PROTEUS MIRABILIS (A)  Final      Susceptibility   Proteus mirabilis - MIC*    AMPICILLIN <=2 SENSITIVE Sensitive     CEFAZOLIN <=4 SENSITIVE Sensitive     CEFEPIME <=0.12 SENSITIVE Sensitive     CEFTRIAXONE <=0.25 SENSITIVE Sensitive     CIPROFLOXACIN <=0.25 SENSITIVE Sensitive     GENTAMICIN 4 SENSITIVE Sensitive  IMIPENEM 2 SENSITIVE Sensitive     NITROFURANTOIN RESISTANT Resistant     TRIMETH/SULFA <=20 SENSITIVE Sensitive     AMPICILLIN/SULBACTAM <=2 SENSITIVE Sensitive     PIP/TAZO <=4 SENSITIVE Sensitive     * >=100,000 COLONIES/mL PROTEUS MIRABILIS    Procedures and diagnostic studies:  No results found.             LOS: 3 days   Faysal Fenoglio  [facility]   Pager on [email or web address]. If 7PM-7AM, please contact night-coverage at [email or web address]     08/13/2020, 1:29 PM

## 2020-08-14 ENCOUNTER — Telehealth (HOSPITAL_COMMUNITY): Payer: Self-pay | Admitting: *Deleted

## 2020-08-14 ENCOUNTER — Inpatient Hospital Stay (HOSPITAL_COMMUNITY): Payer: Medicaid Other

## 2020-08-14 LAB — CBC WITH DIFFERENTIAL/PLATELET
Abs Immature Granulocytes: 0.07 10*3/uL (ref 0.00–0.07)
Basophils Absolute: 0 10*3/uL (ref 0.0–0.1)
Basophils Relative: 0 %
Eosinophils Absolute: 0.8 10*3/uL — ABNORMAL HIGH (ref 0.0–0.5)
Eosinophils Relative: 11 %
HCT: 28.2 % — ABNORMAL LOW (ref 36.0–46.0)
Hemoglobin: 7.4 g/dL — ABNORMAL LOW (ref 12.0–15.0)
Immature Granulocytes: 1 %
Lymphocytes Relative: 23 %
Lymphs Abs: 1.7 10*3/uL (ref 0.7–4.0)
MCH: 21.4 pg — ABNORMAL LOW (ref 26.0–34.0)
MCHC: 26.2 g/dL — ABNORMAL LOW (ref 30.0–36.0)
MCV: 81.7 fL (ref 80.0–100.0)
Monocytes Absolute: 0.7 10*3/uL (ref 0.1–1.0)
Monocytes Relative: 9 %
Neutro Abs: 4 10*3/uL (ref 1.7–7.7)
Neutrophils Relative %: 56 %
Platelets: 401 10*3/uL — ABNORMAL HIGH (ref 150–400)
RBC: 3.45 MIL/uL — ABNORMAL LOW (ref 3.87–5.11)
RDW: 22.8 % — ABNORMAL HIGH (ref 11.5–15.5)
WBC: 7.2 10*3/uL (ref 4.0–10.5)
nRBC: 0 % (ref 0.0–0.2)

## 2020-08-14 LAB — BASIC METABOLIC PANEL
Anion gap: 6 (ref 5–15)
BUN: 23 mg/dL — ABNORMAL HIGH (ref 6–20)
CO2: 24 mmol/L (ref 22–32)
Calcium: 8.2 mg/dL — ABNORMAL LOW (ref 8.9–10.3)
Chloride: 112 mmol/L — ABNORMAL HIGH (ref 98–111)
Creatinine, Ser: 2.42 mg/dL — ABNORMAL HIGH (ref 0.44–1.00)
GFR, Estimated: 25 mL/min — ABNORMAL LOW (ref >60)
Glucose, Bld: 89 mg/dL (ref 70–99)
Potassium: 3.4 mmol/L — ABNORMAL LOW (ref 3.5–5.1)
Sodium: 142 mmol/L (ref 135–145)

## 2020-08-14 LAB — GLUCOSE, CAPILLARY
Glucose-Capillary: 74 mg/dL (ref 70–99)
Glucose-Capillary: 102 mg/dL — ABNORMAL HIGH (ref 70–99)
Glucose-Capillary: 100 mg/dL — ABNORMAL HIGH (ref 70–99)
Glucose-Capillary: 85 mg/dL (ref 70–99)

## 2020-08-14 MED ORDER — SILVER SULFADIAZINE 1 % EX CREA
TOPICAL_CREAM | Freq: Every day | CUTANEOUS | Status: DC
  Filled 2020-08-14 (×3): qty 50

## 2020-08-14 MED ORDER — LINEZOLID 600 MG PO TABS
600.0000 mg | ORAL_TABLET | Freq: Two times a day (BID) | ORAL | Status: DC
  Administered 2020-08-14 – 2020-08-17 (×6): 600 mg via ORAL
  Filled 2020-08-14 (×8): qty 1

## 2020-08-14 MED ORDER — CEPHALEXIN 500 MG PO CAPS
500.0000 mg | ORAL_CAPSULE | Freq: Four times a day (QID) | ORAL | Status: DC
  Administered 2020-08-14 – 2020-08-17 (×12): 500 mg via ORAL
  Filled 2020-08-14 (×12): qty 1

## 2020-08-14 NOTE — Telephone Encounter (Signed)
DSS calling for Fax # to send  ROI

## 2020-08-14 NOTE — Progress Notes (Addendum)
Progress Note    Heather [name]  DJM:[id number] DOB: 27-Jul-1975  DOA: 08/09/2020 PCP: Lucianne Lei, MD      Brief Narrative:    Medical records reviewed and are as summarized below:  Heather [name] is a 44 y.o. female with medical history significant for morbid obesity, obstructive sleep apnea, chronic anemia, COPD, depression, anxiety, type II DM, ambulatory dysfunction, restlessness syndrome, bipolar disorder.  She was brought to the hospital because of a fall at home.  Reportedly, her mom had called EMS because patient fell on the floor and had been unable to get up for 48 hours.  She complained of painful lesions on her lower abdomen/pannus.  She was admitted to the hospital for acute kidney injury, traumatic rhabdomyolysis, panniculitis and UTI.  She was treated with IV fluids and empiric IV antibiotics.  Urine culture revealed Proteus mirabilis.  She was evaluated by PT and OT who recommended discharge to SNF.      Assessment/Plan:   Principal Problem:   Acute renal failure (HCC) Active Problems:   Morbid obesity (HCC)   Leukocytosis   Essential hypertension   GERD (gastroesophageal reflux disease)   Cellulitis, abdominal wall   DM type 2 (diabetes mellitus, type 2) (HCC)   OSA (obstructive sleep apnea)   Rhabdomyolysis   Pressure injury of skin  Body mass index is 89.46 kg/m.  (Morbid obesity)  Acute kidney injury: Continue IV fluids.  Repeat BMP tomorrow.  No evidence of hydronephrosis or acute abnormality noted on renal ultrasound obtained today.  Hypokalemia: Replete potassium and monitor levels  Traumatic rhabdomyolysis: Improved  Panniculitis and Proteus mirabilis UTI: Change IV antibiotics to oral Keflex and oral Zyvox  8.4 cm right renal mass, bilateral nonobstructing nephrolithiasis: MRI with and without contrast will be ordered for further evaluation when kidney function improves.  Other comorbidities include hypertension, anxiety,  depression, restless leg syndrome, chronic anemia  Follow-up with social worker to assist with disposition.  Diet Order            Diet Heart Room service appropriate? Yes; Fluid consistency: Thin  Diet effective now                    Consultants:  None  Procedures:  None    Medications:   . buPROPion  300 mg Oral Daily  . busPIRone  20 mg Oral BID  . chlorhexidine  15 mL Mouth Rinse BID  . Chlorhexidine Gluconate Cloth  6 each Topical Daily  . cholecalciferol  5,000 Units Oral Daily  . DULoxetine  60 mg Oral Daily  . enoxaparin (LOVENOX) injection  100 mg Subcutaneous Q24H  . insulin aspart  0-5 Units Subcutaneous QHS  . insulin aspart  0-9 Units Subcutaneous TID WC  . mouth rinse  15 mL Mouth Rinse q12n4p  . montelukast  10 mg Oral QHS  . nystatin  1 application Topical TID  . QUEtiapine  25 mg Oral QHS  . rOPINIRole  4 mg Oral QHS  . silver sulfADIAZINE   Topical Daily  . vitamin B-12  5,000 mcg Oral Daily   Continuous Infusions: . sodium chloride 75 mL/hr at 08/14/20 0800  . cefTRIAXone (ROCEPHIN)  IV 2 g (08/13/20 1412)  . linezolid (ZYVOX) IV 600 mg (08/14/20 0217)     Anti-infectives (From admission, onward)   Start     Dose/Rate Route Frequency Ordered Stop   08/12/20 1400  cefTRIAXone (ROCEPHIN) 2 g in sodium chloride 0.9 %  100 mL IVPB        2 g 200 mL/hr over 30 Minutes Intravenous Every 24 hours 08/12/20 1326     08/11/20 1400  linezolid (ZYVOX) IVPB 600 mg        600 mg 300 mL/hr over 60 Minutes Intravenous Every 12 hours 08/11/20 1255     08/10/20 0620  vancomycin variable dose per unstable renal function (pharmacist dosing)  Status:  Discontinued         Does not apply See admin instructions 08/10/20 0620 08/11/20 1252   08/10/20 0600  vancomycin (VANCOREADY) IVPB 2000 mg/400 mL        2,000 mg 100 mL/hr over 240 Minutes Intravenous  Once 08/10/20 0547 08/10/20 1626             Family Communication/Anticipated D/C date and  plan/Code Status   DVT prophylaxis:      Code Status: Full Code  Family Communication: None Disposition Plan:    Status is: Inpatient  Remains inpatient appropriate because:Unsafe d/c plan   Dispo: The patient is from: Home              Anticipated d/c is to: SNF              Patient currently is not medically stable to d/c.   Difficult to place patient No           Subjective:   Interval events noted.  She feels weak  Objective:    Vitals:   08/13/20 1323 08/13/20 2151 08/14/20 0553 08/14/20 0924  BP: (!) 123/59 113/89 (!) 101/56 (!) 125/56  Pulse: 95 84 95 90  Resp: 19 18 (!) 22   Temp: 98.9 F (37.2 C) 98.2 F (36.8 C) 98.1 F (36.7 C)   TempSrc: Oral Oral Oral   SpO2: 100% 99% 96%   Weight:      Height:       No data found.   Intake/Output Summary (Last 24 hours) at 08/14/2020 1149 Last data filed at 08/14/2020 1100 Gross per 24 hour  Intake --  Output 2201 ml  Net -2201 ml   Filed Weights   08/10/20 1752 08/11/20 0454 08/11/20 0932  Weight: (!) 230 kg (!) 230 kg (!) 229.1 kg    Exam:  GEN: NAD SKIN: Stage II right posterior knee decubitus ulcer, multiple ulcers on the lower abdomen/suprapubic area EYES: EOMI ENT: MMM CV: RRR PULM: CTA B ABD: soft, obese with large pannus, NT, +BS CNS: AAO x 3, non focal EXT: No edema or tenderness    Data Reviewed:   I have personally reviewed following labs and imaging studies:  Labs: Labs show the following:   Basic Metabolic Panel: Recent Labs  Lab 08/10/20 0506 08/11/20 0321 08/12/20 0409 08/13/20 0732 08/14/20 0331  NA 143 142 149* 145 142  K 4.4 3.9 3.9 3.6 3.4*  CL 108 110 116* 116* 112*  CO2 21* 21* 22 21* 24  GLUCOSE 90 76 81 76 89  BUN 47* 42* 33* 26* 23*  CREATININE 4.18* 3.36* 2.85* 2.27* 2.42*  CALCIUM 9.0 8.8* 9.0 8.2* 8.2*  MG  --  2.2 2.2 1.8  --   PHOS  --  4.1 3.3 2.4*  --    GFR Estimated Creatinine Clearance: 58.9 mL/min (A) (by C-G formula based on SCr of  2.42 mg/dL (H)). Liver Function Tests: Recent Labs  Lab 08/10/20 0506 08/12/20 0409  AST 117* 90*  ALT 48* 54*  ALKPHOS 98 92  BILITOT 0.4 0.2*  PROT 7.7 7.8  ALBUMIN 2.4* 2.4*   No results for input(s): LIPASE, AMYLASE in the last 168 hours. No results for input(s): AMMONIA in the last 168 hours. Coagulation profile No results for input(s): INR, PROTIME in the last 168 hours.  CBC: Recent Labs  Lab 08/10/20 0506 08/11/20 0321 08/12/20 0409 08/13/20 0732 08/13/20 1445  WBC 19.1* 13.6* 10.8* 9.6 9.4  NEUTROABS 15.2*  --   --   --   --   HGB 8.3* 8.0* 8.2* 7.2* 7.8*  HCT 28.9* 29.0* 30.7* 27.1* 28.9*  MCV 76.3* 78.4* 80.2 80.9 81.4  PLT 607* 559* 549* 430* 435*   Cardiac Enzymes: Recent Labs  Lab 08/10/20 0506 08/11/20 0321 08/12/20 0409 08/13/20 0732  CKTOTAL 2,291* 1,226* 946* 377*   BNP (last 3 results) No results for input(s): PROBNP in the last 8760 hours. CBG: Recent Labs  Lab 08/13/20 0849 08/13/20 1118 08/13/20 1611 08/14/20 0729 08/14/20 1120  GLUCAP 87 82 106* 74 102*   D-Dimer: No results for input(s): DDIMER in the last 72 hours. Hgb A1c: No results for input(s): HGBA1C in the last 72 hours. Lipid Profile: No results for input(s): CHOL, HDL, LDLCALC, TRIG, CHOLHDL, LDLDIRECT in the last 72 hours. Thyroid function studies: No results for input(s): TSH, T4TOTAL, T3FREE, THYROIDAB in the last 72 hours.  Invalid input(s): FREET3 Anemia work up: No results for input(s): VITAMINB12, FOLATE, FERRITIN, TIBC, IRON, RETICCTPCT in the last 72 hours. Sepsis Labs: Recent Labs  Lab 08/10/20 0520 08/11/20 0321 08/12/20 0409 08/13/20 0732 08/13/20 1445  WBC  --  13.6* 10.8* 9.6 9.4  LATICACIDVEN 1.7  --   --   --   --     Microbiology Recent Results (from the past 240 hour(s))  Blood culture (routine x 2)     Status: None (Preliminary result)   Collection Time: 08/10/20  5:20 AM   Specimen: BLOOD LEFT HAND  Result Value Ref Range Status    Specimen Description   Final    BLOOD LEFT HAND Performed at [facility], [place] [address], [place], [place] [address]    Special Requests   Final    BOTTLES DRAWN AEROBIC ONLY Blood Culture results may not be optimal due to an inadequate volume of blood received in culture bottles Performed at [facility] [address], [place], [place] [address]    Culture   Final    NO GROWTH 4 DAYS Performed at [facility], [place] [address], [place], [place] [address]    Report Status PENDING  Incomplete  Resp Panel by RT-PCR (Flu A&B, Covid) Nasopharyngeal Swab     Status: None   Collection Time: 08/10/20  7:42 AM   Specimen: Nasopharyngeal Swab; Nasopharyngeal(NP) swabs in vial transport medium  Result Value Ref Range Status   SARS Coronavirus 2 by RT PCR NEGATIVE NEGATIVE Final    Comment: (NOTE) SARS-CoV-2 target nucleic acids are NOT DETECTED.  The SARS-CoV-2 RNA is generally detectable in upper respiratory specimens during the acute phase of infection. The lowest concentration of SARS-CoV-2 viral copies this assay can detect is 138 copies/mL. A negative result does not preclude SARS-Cov-2 infection and should not be used as the sole basis for treatment or other patient management decisions. A negative result may occur with  improper specimen collection/handling, submission of specimen other than nasopharyngeal swab, presence of viral mutation(s) within the areas targeted by this assay, and inadequate number of viral copies(<138 copies/mL).  A negative result must be combined with clinical observations, patient history, and epidemiological information. The expected result is Negative.  Fact Sheet for Patients:  EntrepreneurPulse.com.au  Fact Sheet for Healthcare Providers:  IncredibleEmployment.be  This test is no t yet approved or cleared by the [place] FDA and  has been authorized for detection  and/or diagnosis of SARS-CoV-2 by FDA under an Emergency Use Authorization (EUA). This EUA will remain  in effect (meaning this test can be used) for the duration of the COVID-19 declaration under Section 564(b)(1) of the Act, 21 U.S.C.section 360bbb-3(b)(1), unless the authorization is terminated  or revoked sooner.       Influenza A by PCR NEGATIVE NEGATIVE Final   Influenza B by PCR NEGATIVE NEGATIVE Final    Comment: (NOTE) The Xpert Xpress SARS-CoV-2/FLU/RSV plus assay is intended as an aid in the diagnosis of influenza from Nasopharyngeal swab specimens and should not be used as a sole basis for treatment. Nasal washings and aspirates are unacceptable for Xpert Xpress SARS-CoV-2/FLU/RSV testing.  Fact Sheet for Patients: EntrepreneurPulse.com.au  Fact Sheet for Healthcare Providers: IncredibleEmployment.be  This test is not yet approved or cleared by the [place] FDA and has been authorized for detection and/or diagnosis of SARS-CoV-2 by FDA under an Emergency Use Authorization (EUA). This EUA will remain in effect (meaning this test can be used) for the duration of the COVID-19 declaration under Section 564(b)(1) of the Act, 21 U.S.C. section 360bbb-3(b)(1), unless the authorization is terminated or revoked.  Performed at [facility], [place] [address] [place], [place] [address]   Culture, Urine     Status: Abnormal   Collection Time: 08/11/20  9:37 AM   Specimen: Urine, Catheterized  Result Value Ref Range Status   Specimen Description   Final    URINE, CATHETERIZED Performed at [facility] [address], [place], [place] [address]    Special Requests   Final    Normal Performed at [facility], [place] [address], [place], [place] [address]    Culture >=100,000 COLONIES/mL PROTEUS MIRABILIS (A)  Final   Report Status 08/13/2020 FINAL  Final   Organism ID, Bacteria  PROTEUS MIRABILIS (A)  Final      Susceptibility   Proteus mirabilis - MIC*    AMPICILLIN <=2 SENSITIVE Sensitive     CEFAZOLIN <=4 SENSITIVE Sensitive     CEFEPIME <=0.12 SENSITIVE Sensitive     CEFTRIAXONE <=0.25 SENSITIVE Sensitive     CIPROFLOXACIN <=0.25 SENSITIVE Sensitive     GENTAMICIN 4 SENSITIVE Sensitive     IMIPENEM 2 SENSITIVE Sensitive     NITROFURANTOIN RESISTANT Resistant     TRIMETH/SULFA <=20 SENSITIVE Sensitive     AMPICILLIN/SULBACTAM <=2 SENSITIVE Sensitive     PIP/TAZO <=4 SENSITIVE Sensitive     * >=100,000 COLONIES/mL PROTEUS MIRABILIS    Procedures and diagnostic studies:  US RENAL  Result Date: 07-Sep-2020 CLINICAL DATA:  Acute kidney injury. EXAM: RENAL / URINARY TRACT ULTRASOUND COMPLETE COMPARISON:  Abdomen ultrasound, [id number]. FINDINGS: Right Kidney: Renal measurements: 18.1 x 6.2 x 6.8 cm = volume: 402 mL. Normal parenchymal echogenicity. Oval mass arises from the upper pole enlarging the overall length of the kidney. Mass measures 8.4 x 7.3 x 7.7 cm. Mass has internal echoes, but a thin wall and increased through transmission of the sound beam with no definitive internal blood flow on color Doppler. There are shadowing collecting system stones, largest measuring 1.2 cm. No hydronephrosis. Left Kidney: Renal measurements:  13.4 x 6.6 x 5.2 cm = volume: 238 mL. Normal parenchymal echogenicity. Shadowing stones, largest measuring 1.9 cm. No masses. No hydronephrosis. Bladder: Decompressed with a Foley catheter. Other: None. IMPRESSION: 1. 8.4 cm mass arises from the upper pole of the right kidney. Although this may reflect a cyst, it is not fully defined/characterized and could be a solid mass. Recommend follow-up renal MRI without and with contrast. This mass was not evident on the previous ultrasound. 2. Bilateral nonobstructing intrarenal stones. 3. No hydronephrosis.  No acute finding. Electronically Signed   By: Lajean Manes M.D.   On: 08/14/2020 08:19                LOS: 4 days   Heather [name]  [facility]   Pager on [email or web address]. If 7PM-7AM, please contact night-coverage at [email or web address]     08/14/2020, 11:49 AM

## 2020-08-14 NOTE — Consult Note (Signed)
[facility] Nurse wound consult note Consultation was completed by review of records, images and assistance from the bedside nurse/clinical staff.   Patient was seen this admission by my [place] partner on 08/10/20 aswell  Reason for Consult:multiple ulcers pannus Patient fell at home and was down for 3 days, lesions most likely related to incontinence and moisture and rubbing of skin folds together  Wound type:MASD (moisture associated skin damage); full thickness and partial thickness lesions  Pressure Injury POA: NA Measurement:scattered lesions under the pannus, inner thighs and mons pubis Wound bed: pink and clean Periwound: intact  Dressing procedure/placement/frequency: Continue antimicrobial wicking fabric for coverage of yeast and moisture management Add silvadene for topical care to open lesions  Abtx therapy per medical team.   Make sure that she is bathed and all products removed completely, allowed to dry and reapplied.   .  Re consult if needed, will not follow at this time. Thanks  Zakiyah Diop [employer], RN,CWOCN, CNS, [place] [phone])

## 2020-08-14 NOTE — Progress Notes (Signed)
Physical Therapy Treatment Patient Details Name: Heather [name] MRN: [id number] DOB: 09/20/1975 Today's Date: 08/14/2020    History of Present Illness Patient is a 45 year old female admitted 5/4 with rhabdomyolsis after fall at home and unable to get up. PMH includes super morbid obesity with BMI of 89.46, sleep apnea untreated, chronic anemia, COPD, depression, type 2 diabetes not on treatment, restless leg syndrome, affective disorder bipolar type    PT Comments    Pt assisted to sitting EOB and then standing x4 with cues for technique to improve endurance and strength.  Pt's nursing staff also in room to perform hygiene with pt in standing and change linen.  Pt reports weakness limiting ability to ambulate today however was able to take a few small step up [place] with RW.   Follow Up Recommendations  CIR     Equipment Recommendations  Rolling walker with 5" wheels    Recommendations for Other Services       Precautions / Restrictions Precautions Precautions: Fall Precaution Comments: multiple wounds in skin folds    Mobility  Bed Mobility Overal bed mobility: Needs Assistance Bed Mobility: Supine to Sit;Sit to Supine     Supine to sit: Mod assist;+2 for safety/equipment Sit to supine: Mod assist;+2 for safety/equipment   General bed mobility comments: pt self assisting as able, cues for technique, assist for trunk upright and positioning LEs over EOB, assist for LEs onto bed upon return to supine    Transfers Overall transfer level: Needs assistance Equipment used: Rolling walker (2 wheeled) Transfers: Sit to/from Stand Sit to Stand: Mod assist;+2 safety/equipment         General transfer comment: verbal cues for technique, pt initiates, assist to rise and cues to control descent, only tolerates standing approx 1 to 1.5 minutes, performed x4  Ambulation/Gait             General Gait Details: pt felt unable to today   Stairs              Wheelchair Mobility    Modified Rankin (Stroke Patients Only)       Balance Overall balance assessment: History of Falls                                          Cognition Arousal/Alertness: Awake/alert Behavior During Therapy: WFL for tasks assessed/performed Overall Cognitive Status: Within Functional Limits for tasks assessed                                        Exercises      General Comments        Pertinent Vitals/Pain Pain Assessment: Faces Faces Pain Scale: Hurts whole lot Pain Location: mostly peri area as nursing performing hygiene care Pain Descriptors / Indicators: Sore Pain Intervention(s): Repositioned;Monitored during session    Home Living                      Prior Function            PT Goals (current goals can now be found in the care plan section) Progress towards PT goals: Progressing toward goals    Frequency    Min 2X/week      PT Plan Discharge plan needs to be updated  Co-evaluation              AM-PAC PT "6 Clicks" Mobility   Outcome Measure  Help needed turning from your back to your side while in a flat bed without using bedrails?: A Lot Help needed moving from lying on your back to sitting on the side of a flat bed without using bedrails?: A Lot Help needed moving to and from a bed to a chair (including a wheelchair)?: A Lot Help needed standing up from a chair using your arms (e.g., wheelchair or bedside chair)?: A Lot Help needed to walk in hospital room?: Total Help needed climbing 3-5 steps with a railing? : Total 6 Click Score: 10    End of Session   Activity Tolerance: Patient limited by pain Patient left: in bed;with call bell/phone within reach;with nursing/sitter in room   PT Visit Diagnosis: Difficulty in walking, not elsewhere classified (R26.2);Muscle weakness (generalized) (M62.81)     Time: 6803-2122 PT Time Calculation (min) (ACUTE ONLY): 36  min  Charges:  $Therapeutic Activity: 23-37 mins                     Jannette Spanner PT, DPT Acute Rehabilitation Services Pager: [phone] Office: [phone]   York Ram E 08/14/2020, 4:26 PM

## 2020-08-14 NOTE — Progress Notes (Signed)
Inpatient Rehab Admissions Coordinator Note:   Per CSW request, pt was screened for CIR candidacy by Shann Medal, PT, DPT.  At this time pt does not appear to be able to tolerate CIR level therapies, and would likely not be able to progress to a level to allow her to d/c home within the short time period pts are admitted to our program.  Agree with PT recommendations for SNF at this time.  Please contact me with questions.   Shann Medal, PT, DPT [phone] 08/14/20 1:40 PM

## 2020-08-15 LAB — CULTURE, BLOOD (ROUTINE X 2): Culture: NO GROWTH

## 2020-08-15 LAB — BASIC METABOLIC PANEL
Anion gap: 4 — ABNORMAL LOW (ref 5–15)
BUN: 17 mg/dL (ref 6–20)
CO2: 24 mmol/L (ref 22–32)
Calcium: 8.2 mg/dL — ABNORMAL LOW (ref 8.9–10.3)
Chloride: 112 mmol/L — ABNORMAL HIGH (ref 98–111)
Creatinine, Ser: 2.14 mg/dL — ABNORMAL HIGH (ref 0.44–1.00)
GFR, Estimated: 29 mL/min — ABNORMAL LOW (ref >60)
Glucose, Bld: 82 mg/dL (ref 70–99)
Potassium: 3.4 mmol/L — ABNORMAL LOW (ref 3.5–5.1)
Sodium: 140 mmol/L (ref 135–145)

## 2020-08-15 LAB — GLUCOSE, CAPILLARY
Glucose-Capillary: 75 mg/dL (ref 70–99)
Glucose-Capillary: 120 mg/dL — ABNORMAL HIGH (ref 70–99)
Glucose-Capillary: 86 mg/dL (ref 70–99)
Glucose-Capillary: 104 mg/dL — ABNORMAL HIGH (ref 70–99)

## 2020-08-15 LAB — MAGNESIUM: Magnesium: 2 mg/dL (ref 1.7–2.4)

## 2020-08-15 MED ORDER — POTASSIUM CHLORIDE IN NACL 20-0.9 MEQ/L-% IV SOLN
INTRAVENOUS | Status: AC
  Filled 2020-08-15 (×2): qty 1000

## 2020-08-15 NOTE — Plan of Care (Signed)
  Problem: Clinical Measurements: Goal: Respiratory complications will improve Outcome: Completed/Met

## 2020-08-15 NOTE — Progress Notes (Signed)
Occupational Therapy Treatment Patient Details Name: Heather [name] MRN: [id number] DOB: 12-23-1975 Today's Date: 08/15/2020    History of present illness Patient is a 45 year old female admitted 5/4 with rhabdomyolsis after fall at home and unable to get up. PMH includes super morbid obesity with BMI of 89.46, sleep apnea untreated, chronic anemia, COPD, depression, type 2 diabetes not on treatment, restless leg syndrome, affective disorder bipolar type.   OT comments  Patient progressing slowly but steadily and showed improved ability to feed self at bed level, and perform limited UE HEP at EOB with fair sitting balance, but quick to fatigue, necessitating exercises to continue in supine.  Patient remains limited by body habitus, diffuse bodily pain, psychosocial stressors of fearing loosing her 43 year old twins, generalized weakness and poor activity tolerance along with deficits noted below. Pt continues to demonstrate fair rehab potential and could benefit from continued skilled OT to increase safety and independence with ADLs and functional transfers to allow pt to return home safely and reduce caregiver burden and fall risk.   Follow Up Recommendations  SNF    Equipment Recommendations   ([facility])    Recommendations for Other Services      Precautions / Restrictions Precautions Precautions: Fall Precaution Comments: multiple wounds in skin folds Restrictions Weight Bearing Restrictions: No       Mobility Bed Mobility Overal bed mobility: Needs Assistance Bed Mobility: Supine to Sit;Sit to Supine     Supine to sit: Mod assist;HOB elevated Sit to supine: Max assist   General bed mobility comments: Increased time and max effort for pt to come up to sitting. Heavy use of rails. Sit to supine with pt requiring heavy rail use for trunk and BLE assist to bed.  Supine scooting with increased time/effort minimal advancement and Moderate assist.    Transfers                       Balance Overall balance assessment: History of Falls;Needs assistance Sitting-balance support: No upper extremity supported Sitting balance-Leahy Scale: Fair Sitting balance - Comments: Pt does not like all air deflated from bed, so mattress lowered to max soft for safety. Pt sat EOB x 8 min, began UE exercises, but became too fatigued and asked to complete exercises in supine. Pt then assisted back to supine.                                   ADL either performed or assessed with clinical judgement   ADL Overall ADL's : Needs assistance/impaired Eating/Feeding: Set up;Bed level Eating/Feeding Details (indicate cue type and reason): Pt finishing lunch. Declined sitting up on EOB to eat. Grooming: Brushing hair;Sitting Grooming Details (indicate cue type and reason): While sitting EOB, pt given comb and encoraged to comb hair. Pt made two attempts and found hair knot in back of head and stated, "I'm not getting that out with this comb." and pt terminated activity.                                     Vision   Vision Assessment?: No apparent visual deficits   Perception     Praxis      Cognition Arousal/Alertness: Awake/alert Behavior During Therapy: WFL for tasks assessed/performed Overall Cognitive Status: Within Functional Limits for tasks assessed  General Comments: Needs encouragement.        Exercises General Exercises - Upper Extremity Shoulder Flexion: AROM;Theraband;Strengthening;Both;10 reps;Supine Theraband Level (Shoulder Flexion): Level 3 (Green) Shoulder Horizontal ABduction: AROM;Theraband;Both;10 reps;Seated Theraband Level (Shoulder Horizontal Abduction): Level 3 (Green) Elbow Flexion: Supine;10 reps;Both;Strengthening;Theraband Theraband Level (Elbow Flexion): Level 3 (Green) Elbow Extension: Supine;AROM;Strengthening;10 reps;Both;Theraband Theraband Level (Elbow  Extension): Level 3 (Green)   Shoulder Instructions       General Comments multiple sites of skin breakdown in body folds.    Pertinent Vitals/ Pain       Pain Assessment: No/denies pain  Home Living                                          Prior Functioning/Environment              Frequency  Min 2X/week        Progress Toward Goals  OT Goals(current goals can now be found in the care plan section)  Progress towards OT goals: Progressing toward goals  Acute Rehab OT Goals Patient Stated Goal: "To be there for my kids." OT Goal Formulation: With patient Time For Goal Achievement: 08/25/20 Potential to Achieve Goals: [place] Discharge plan remains appropriate    Co-evaluation                 AM-PAC OT "6 Clicks" Daily Activity     Outcome Measure   Help from another person eating meals?: None Help from another person taking care of personal grooming?: A Little Help from another person toileting, which includes using toliet, bedpan, or urinal?: Total (foley) Help from another person bathing (including washing, rinsing, drying)?: A Lot Help from another person to put on and taking off regular upper body clothing?: A Lot Help from another person to put on and taking off regular lower body clothing?: Total 6 Click Score: 13    End of Session    OT Visit Diagnosis: History of falling (Z91.81);Pain;Other abnormalities of gait and mobility (R26.89);Muscle weakness (generalized) (M62.81) Pain - part of body:  (diffuse c/o on body.)   Activity Tolerance Patient limited by pain   Patient Left in bed;with call bell/phone within reach;with bed alarm set (MD in room.)   Nurse Communication  (Nurse to room and assisted with iar bed for safety.)        Time: 1305-1350 OT Time Calculation (min): 45 min  Charges: OT General Charges $OT Visit: 1 Visit OT Treatments $Self Care/Home Management : 8-22 mins $Therapeutic Activity: 8-22  mins $Therapeutic Exercise: 8-22 mins  Anderson Malta, OT Acute Rehab Services Office: [phone] 08/15/2020   Julien Girt 08/15/2020, 1:57 PM

## 2020-08-15 NOTE — Progress Notes (Addendum)
Progress Note    Heather [name]  DQQ:[id number] DOB: Dec 04, 1975  DOA: 08/09/2020 PCP: Lucianne Lei, MD      Brief Narrative:    Medical records reviewed and are as summarized below:  Heather [name] is a 45 y.o. female with medical history significant for morbid obesity, obstructive sleep apnea, chronic anemia, COPD, depression, anxiety, type II DM, ambulatory dysfunction, restlessness syndrome, bipolar disorder, right renal mass.  She was brought to the hospital because of a fall at home.  Reportedly, her mom had called EMS because patient fell on the floor and had been unable to get up for 48 hours.  She complained of painful lesions on her lower abdomen/pannus.  She was admitted to the hospital for acute kidney injury, traumatic rhabdomyolysis, panniculitis and UTI.  She was treated with IV fluids and empiric antibiotics.  Urine culture revealed Proteus mirabilis.  She was evaluated by PT and OT who recommended discharge to SNF.      Assessment/Plan:   Principal Problem:   Acute renal failure (HCC) Active Problems:   Morbid obesity (HCC)   Leukocytosis   Essential hypertension   GERD (gastroesophageal reflux disease)   Cellulitis, abdominal wall   DM type 2 (diabetes mellitus, type 2) (HCC)   OSA (obstructive sleep apnea)   Rhabdomyolysis   Pressure injury of skin  Body mass index is 92.55 kg/m.  (Morbid obesity)  Acute kidney injury: Creatinine is improving.  Continue IV fluids and monitor BMP.  No evidence of hydronephrosis or acute abnormality noted on renal ultrasound obtained.  Hypokalemia: Replete potassium and monitor levels  Traumatic rhabdomyolysis: Improved  Panniculitis and Proteus mirabilis UTI: Continue Keflex and oral Zyvox.  Plan to complete antibiotics on 08/16/2020.  8.4 cm right renal mass, bilateral nonobstructing nephrolithiasis: MRI with and without contrast will be ordered for further evaluation when kidney function improves.   Consulted Dr. Gloriann Loan, urologist, for further evaluation of renal mass.  He recommended outpatient follow-up. Chart review shows that patient had MRI of the lumbar spine in July 2020 and a large cystic mass measuring 6.8 cm was found in the right kidney.  Patient says she was scheduled to see a urologist on 08/16/2020.  S/p fall at home: PT and OT recommend discharge to SNF.  However, she does not want to go to SNF.  It will be difficult for her to manage at home.  Continue PT and OT.  Chronic anemia, iron deficiency anemia: Monitor H&H.  No indication for blood transfusion at this time.  Other comorbidities include hypertension, anxiety, depression, restless leg syndrome, chronic anemia   Diet Order            Diet Heart Room service appropriate? Yes; Fluid consistency: Thin  Diet effective now                    Consultants:  None  Procedures:  None    Medications:   . buPROPion  300 mg Oral Daily  . busPIRone  20 mg Oral BID  . cephALEXin  500 mg Oral Q6H  . chlorhexidine  15 mL Mouth Rinse BID  . Chlorhexidine Gluconate Cloth  6 each Topical Daily  . cholecalciferol  5,000 Units Oral Daily  . DULoxetine  60 mg Oral Daily  . enoxaparin (LOVENOX) injection  100 mg Subcutaneous Q24H  . insulin aspart  0-5 Units Subcutaneous QHS  . insulin aspart  0-9 Units Subcutaneous TID WC  . linezolid  600 mg  Oral Q12H  . mouth rinse  15 mL Mouth Rinse q12n4p  . montelukast  10 mg Oral QHS  . nystatin  1 application Topical TID  . QUEtiapine  25 mg Oral QHS  . rOPINIRole  4 mg Oral QHS  . silver sulfADIAZINE   Topical Daily  . vitamin B-12  5,000 mcg Oral Daily   Continuous Infusions: . 0.9 % NaCl with KCl 20 mEq / L 50 mL/hr at 08/15/20 0930     Anti-infectives (From admission, onward)   Start     Dose/Rate Route Frequency Ordered Stop   08/14/20 1700  linezolid (ZYVOX) tablet 600 mg        600 mg Oral Every 12 hours 08/14/20 1149     08/14/20 1300  cephALEXin (KEFLEX)  capsule 500 mg        500 mg Oral Every 6 hours 08/14/20 1152     08/12/20 1400  cefTRIAXone (ROCEPHIN) 2 g in sodium chloride 0.9 % 100 mL IVPB  Status:  Discontinued        2 g 200 mL/hr over 30 Minutes Intravenous Every 24 hours 08/12/20 1326 08/14/20 1152   08/11/20 1400  linezolid (ZYVOX) IVPB 600 mg  Status:  Discontinued        600 mg 300 mL/hr over 60 Minutes Intravenous Every 12 hours 08/11/20 1255 08/14/20 1149   08/10/20 0620  vancomycin variable dose per unstable renal function (pharmacist dosing)  Status:  Discontinued         Does not apply See admin instructions 08/10/20 0620 08/11/20 1252   08/10/20 0600  vancomycin (VANCOREADY) IVPB 2000 mg/400 mL        2,000 mg 100 mL/hr over 240 Minutes Intravenous  Once 08/10/20 0547 08/10/20 1626             Family Communication/Anticipated D/C date and plan/Code Status   DVT prophylaxis:      Code Status: Full Code  Family Communication: None Disposition Plan:    Status is: Inpatient  Remains inpatient appropriate because:Unsafe d/c plan   Dispo: The patient is from: Home              Anticipated d/c is to: SNF              Patient currently is not medically stable to d/c.   Difficult to place patient No           Subjective:   Interval events noted.  She is weak and unable to move in bed or get out of bed.  However, she says she does not want to go to SNF because she has things to take care of at home.  Objective:    Vitals:   08/14/20 2200 08/15/20 0439 08/15/20 0600 08/15/20 1227  BP: 132/71 135/67  140/76  Pulse: 88 90  90  Resp: 20 18  18   Temp: 98.7 F (37.1 C) 99.1 F (37.3 C)  98.7 F (37.1 C)  TempSrc: Oral Oral  Oral  SpO2: 98% 97%  100%  Weight:   (!) 237 kg   Height:       No data found.   Intake/Output Summary (Last 24 hours) at 08/15/2020 1259 Last data filed at 08/15/2020 1256 Gross per 24 hour  Intake 3508.97 ml  Output 2550 ml  Net 958.97 ml   Filed Weights    08/11/20 0454 08/11/20 0932 08/15/20 0600  Weight: (!) 230 kg (!) 229.1 kg (!) 237 kg    Exam:  GEN: NAD SKIN: Stage III right posterior knee decubitus ulcer.  Multiple ulcers on the lower abdomen/pannus EYES: No pallor or icterus ENT: MMM CV: RRR PULM: CTA B ABD: soft, obese with large pannus, NT, +BS CNS: AAO x 3, non focal EXT: No edema or tenderness      Data Reviewed:   I have personally reviewed following labs and imaging studies:  Labs: Labs show the following:   Basic Metabolic Panel: Recent Labs  Lab 08/11/20 0321 08/12/20 0409 08/13/20 0732 08/14/20 0331 08/15/20 0400  NA 142 149* 145 142 140  K 3.9 3.9 3.6 3.4* 3.4*  CL 110 116* 116* 112* 112*  CO2 21* 22 21* 24 24  GLUCOSE 76 81 76 89 82  BUN 42* 33* 26* 23* 17  CREATININE 3.36* 2.85* 2.27* 2.42* 2.14*  CALCIUM 8.8* 9.0 8.2* 8.2* 8.2*  MG 2.2 2.2 1.8  --  2.0  PHOS 4.1 3.3 2.4*  --   --    GFR Estimated Creatinine Clearance: 68.2 mL/min (A) (by C-G formula based on SCr of 2.14 mg/dL (H)). Liver Function Tests: Recent Labs  Lab 08/10/20 0506 08/12/20 0409  AST 117* 90*  ALT 48* 54*  ALKPHOS 98 92  BILITOT 0.4 0.2*  PROT 7.7 7.8  ALBUMIN 2.4* 2.4*   No results for input(s): LIPASE, AMYLASE in the last 168 hours. No results for input(s): AMMONIA in the last 168 hours. Coagulation profile No results for input(s): INR, PROTIME in the last 168 hours.  CBC: Recent Labs  Lab 08/10/20 0506 08/11/20 0321 08/12/20 0409 08/13/20 0732 08/13/20 1445 08/14/20 1258  WBC 19.1* 13.6* 10.8* 9.6 9.4 7.2  NEUTROABS 15.2*  --   --   --   --  4.0  HGB 8.3* 8.0* 8.2* 7.2* 7.8* 7.4*  HCT 28.9* 29.0* 30.7* 27.1* 28.9* 28.2*  MCV 76.3* 78.4* 80.2 80.9 81.4 81.7  PLT 607* 559* 549* 430* 435* 401*   Cardiac Enzymes: Recent Labs  Lab 08/10/20 0506 08/11/20 0321 08/12/20 0409 08/13/20 0732  CKTOTAL 2,291* 1,226* 946* 377*   BNP (last 3 results) No results for input(s): PROBNP in the last 8760  hours. CBG: Recent Labs  Lab 08/14/20 1120 08/14/20 1623 08/14/20 2117 08/15/20 0747 08/15/20 1158  GLUCAP 102* 100* 85 75 120*   D-Dimer: No results for input(s): DDIMER in the last 72 hours. Hgb A1c: No results for input(s): HGBA1C in the last 72 hours. Lipid Profile: No results for input(s): CHOL, HDL, LDLCALC, TRIG, CHOLHDL, LDLDIRECT in the last 72 hours. Thyroid function studies: No results for input(s): TSH, T4TOTAL, T3FREE, THYROIDAB in the last 72 hours.  Invalid input(s): FREET3 Anemia work up: No results for input(s): VITAMINB12, FOLATE, FERRITIN, TIBC, IRON, RETICCTPCT in the last 72 hours. Sepsis Labs: Recent Labs  Lab 08/10/20 0520 08/11/20 0321 08/12/20 0409 08/13/20 0732 08/13/20 1445 08/14/20 1258  WBC  --    < > 10.8* 9.6 9.4 7.2  LATICACIDVEN 1.7  --   --   --   --   --    < > = values in this interval not displayed.    Microbiology Recent Results (from the past 240 hour(s))  Blood culture (routine x 2)     Status: None   Collection Time: 08/10/20  5:20 AM   Specimen: BLOOD LEFT HAND  Result Value Ref Range Status   Specimen Description   Final    BLOOD LEFT HAND Performed at [facility], [address] [place], [place]  [address]    Special Requests   Final    BOTTLES DRAWN AEROBIC ONLY Blood Culture results may not be optimal due to an inadequate volume of blood received in culture bottles Performed at [facility], [place] [address] [place], [place] [address]    Culture   Final    NO GROWTH 5 DAYS Performed at [facility], [place] [address], [place], [place] [address]    Report Status 08/15/2020 FINAL  Final  Resp Panel by RT-PCR (Flu A&B, Covid) Nasopharyngeal Swab     Status: None   Collection Time: 08/10/20  7:42 AM   Specimen: Nasopharyngeal Swab; Nasopharyngeal(NP) swabs in vial transport medium  Result Value Ref Range Status   SARS Coronavirus 2 by RT PCR NEGATIVE NEGATIVE Final     Comment: (NOTE) SARS-CoV-2 target nucleic acids are NOT DETECTED.  The SARS-CoV-2 RNA is generally detectable in upper respiratory specimens during the acute phase of infection. The lowest concentration of SARS-CoV-2 viral copies this assay can detect is 138 copies/mL. A negative result does not preclude SARS-Cov-2 infection and should not be used as the sole basis for treatment or other patient management decisions. A negative result may occur with  improper specimen collection/handling, submission of specimen other than nasopharyngeal swab, presence of viral mutation(s) within the areas targeted by this assay, and inadequate number of viral copies(<138 copies/mL). A negative result must be combined with clinical observations, patient history, and epidemiological information. The expected result is Negative.  Fact Sheet for Patients:  EntrepreneurPulse.com.au  Fact Sheet for Healthcare Providers:  IncredibleEmployment.be  This test is no t yet approved or cleared by the [place] FDA and  has been authorized for detection and/or diagnosis of SARS-CoV-2 by FDA under an Emergency Use Authorization (EUA). This EUA will remain  in effect (meaning this test can be used) for the duration of the COVID-19 declaration under Section 564(b)(1) of the Act, 21 U.S.C.section 360bbb-3(b)(1), unless the authorization is terminated  or revoked sooner.       Influenza A by PCR NEGATIVE NEGATIVE Final   Influenza B by PCR NEGATIVE NEGATIVE Final    Comment: (NOTE) The Xpert Xpress SARS-CoV-2/FLU/RSV plus assay is intended as an aid in the diagnosis of influenza from Nasopharyngeal swab specimens and should not be used as a sole basis for treatment. Nasal washings and aspirates are unacceptable for Xpert Xpress SARS-CoV-2/FLU/RSV testing.  Fact Sheet for Patients: EntrepreneurPulse.com.au  Fact Sheet for Healthcare  Providers: IncredibleEmployment.be  This test is not yet approved or cleared by the [place] FDA and has been authorized for detection and/or diagnosis of SARS-CoV-2 by FDA under an Emergency Use Authorization (EUA). This EUA will remain in effect (meaning this test can be used) for the duration of the COVID-19 declaration under Section 564(b)(1) of the Act, 21 U.S.C. section 360bbb-3(b)(1), unless the authorization is terminated or revoked.  Performed at [facility], [place] [address], [place], [place] [address]   Culture, Urine     Status: Abnormal   Collection Time: 08/11/20  9:37 AM   Specimen: Urine, Catheterized  Result Value Ref Range Status   Specimen Description   Final    URINE, CATHETERIZED Performed at [place] [address], [place], [place] [address]    Special Requests   Final    Normal Performed at [facility], [place] [address] [place], [place] [address]    Culture >=100,000 COLONIES/mL PROTEUS MIRABILIS (A)  Final  Report Status 08/13/2020 FINAL  Final   Organism ID, Bacteria PROTEUS MIRABILIS (A)  Final      Susceptibility   Proteus mirabilis - MIC*    AMPICILLIN <=2 SENSITIVE Sensitive     CEFAZOLIN <=4 SENSITIVE Sensitive     CEFEPIME <=0.12 SENSITIVE Sensitive     CEFTRIAXONE <=0.25 SENSITIVE Sensitive     CIPROFLOXACIN <=0.25 SENSITIVE Sensitive     GENTAMICIN 4 SENSITIVE Sensitive     IMIPENEM 2 SENSITIVE Sensitive     NITROFURANTOIN RESISTANT Resistant     TRIMETH/SULFA <=20 SENSITIVE Sensitive     AMPICILLIN/SULBACTAM <=2 SENSITIVE Sensitive     PIP/TAZO <=4 SENSITIVE Sensitive     * >=100,000 COLONIES/mL PROTEUS MIRABILIS  Aerobic Culture w Gram Stain (superficial specimen)     Status: None (Preliminary result)   Collection Time: 08/12/20 12:46 PM   Specimen: Wound  Result Value Ref Range Status   Specimen Description   Final    WOUND Performed at [place] [address] [place], [place] [address]    Special Requests   Final    NONE Performed at [facility], [place] [address] [place], [place] [address]    Gram Stain   Final    RARE WBC PRESENT, PREDOMINANTLY MONONUCLEAR NO ORGANISMS SEEN    Culture   Final    CULTURE REINCUBATED FOR BETTER GROWTH Performed at [facility], [place] [address], [place], [place] [address]    Report Status PENDING  Incomplete    Procedures and diagnostic studies:  US RENAL  Result Date: Aug 26, 2020 CLINICAL DATA:  Acute kidney injury. EXAM: RENAL / URINARY TRACT ULTRASOUND COMPLETE COMPARISON:  Abdomen ultrasound, [id number]. FINDINGS: Right Kidney: Renal measurements: 18.1 x 6.2 x 6.8 cm = volume: 402 mL. Normal parenchymal echogenicity. Oval mass arises from the upper pole enlarging the overall length of the kidney. Mass measures 8.4 x 7.3 x 7.7 cm. Mass has internal echoes, but a thin wall and increased through transmission of the sound beam with no definitive internal blood flow on color Doppler. There are shadowing collecting system stones, largest measuring 1.2 cm. No hydronephrosis. Left Kidney: Renal measurements: 13.4 x 6.6 x 5.2 cm = volume: 238 mL. Normal parenchymal echogenicity. Shadowing stones, largest measuring 1.9 cm. No masses. No hydronephrosis. Bladder: Decompressed with a Foley catheter. Other: None. IMPRESSION: 1. 8.4 cm mass arises from the upper pole of the right kidney. Although this may reflect a cyst, it is not fully defined/characterized and could be a solid mass. Recommend follow-up renal MRI without and with contrast. This mass was not evident on the previous ultrasound. 2. Bilateral nonobstructing intrarenal stones. 3. No hydronephrosis.  No acute finding. Electronically Signed   By: Lajean Manes M.D.   On: 08-26-20 08:19               LOS: 5 days   Mee Macdonnell  [facility]   Pager on [email or web address]. If 7PM-7AM, please  contact night-coverage at [email or web address]     08/15/2020, 12:59 PM

## 2020-08-15 NOTE — Plan of Care (Signed)
  Problem: Nutrition: Goal: Adequate nutrition will be maintained Outcome: Completed/Met   Problem: Clinical Measurements: Goal: Respiratory complications will improve Outcome: Completed/Met

## 2020-08-16 ENCOUNTER — Other Ambulatory Visit (HOSPITAL_COMMUNITY): Payer: Self-pay | Admitting: Psychiatry

## 2020-08-16 DIAGNOSIS — F4310 Post-traumatic stress disorder, unspecified: Secondary | ICD-10-CM

## 2020-08-16 DIAGNOSIS — G2581 Restless legs syndrome: Secondary | ICD-10-CM

## 2020-08-16 LAB — AEROBIC CULTURE W GRAM STAIN (SUPERFICIAL SPECIMEN)

## 2020-08-16 LAB — BASIC METABOLIC PANEL
Anion gap: 7 (ref 5–15)
BUN: 14 mg/dL (ref 6–20)
CO2: 21 mmol/L — ABNORMAL LOW (ref 22–32)
Calcium: 8.4 mg/dL — ABNORMAL LOW (ref 8.9–10.3)
Chloride: 113 mmol/L — ABNORMAL HIGH (ref 98–111)
Creatinine, Ser: 1.66 mg/dL — ABNORMAL HIGH (ref 0.44–1.00)
GFR, Estimated: 39 mL/min — ABNORMAL LOW (ref >60)
Glucose, Bld: 77 mg/dL (ref 70–99)
Potassium: 4.5 mmol/L (ref 3.5–5.1)
Sodium: 141 mmol/L (ref 135–145)

## 2020-08-16 LAB — GLUCOSE, CAPILLARY
Glucose-Capillary: 73 mg/dL (ref 70–99)
Glucose-Capillary: 82 mg/dL (ref 70–99)
Glucose-Capillary: 77 mg/dL (ref 70–99)
Glucose-Capillary: 96 mg/dL (ref 70–99)

## 2020-08-16 LAB — MAGNESIUM: Magnesium: 1.9 mg/dL (ref 1.7–2.4)

## 2020-08-16 MED ORDER — CHLORHEXIDINE GLUCONATE 0.12 % MT SOLN
OROMUCOSAL | Status: AC
  Filled 2020-08-16: qty 15

## 2020-08-16 NOTE — Progress Notes (Signed)
Progress Note    Heather [name]   JXB:[id number]  DOB: [birth year]  DOA: 08/09/2020     6  PCP: Lucianne Lei, MD  CC: fall at home  Hospital Course: Heather [name] is a 45 y.o. female with medical history significant for morbid obesity, obstructive sleep apnea, chronic anemia, COPD, depression, anxiety, type II DM, ambulatory dysfunction, restlessness syndrome, bipolar disorder, right renal mass.  She was brought to the hospital because of a fall at home. Reportedly, her mom had called EMS because patient fell on the floor and had been unable to get up for 48 hours.  She complained of painful lesions on her lower abdomen/pannus.  She was admitted to the hospital for acute kidney injury, traumatic rhabdomyolysis, panniculitis and UTI. She was treated with IV fluids and empiric antibiotics.  Urine culture revealed Proteus mirabilis.  She was evaluated by PT and OT who recommended discharge to SNF.  Interval History:  No events overnight.  Disposition planning being worked on.  Patient is requesting to be evaluated for CIR and is motivated for this.  She otherwise does not wish to go to an outside rehab facility and would then go home. She was able to ambulate today with physical therapy.  ROS: Constitutional: negative for chills and fevers, Respiratory: negative for cough, Cardiovascular: negative for chest pain and Gastrointestinal: negative for abdominal pain  Assessment & Plan: Principal Problem:   Acute renal failure (HCC) Active Problems:   Morbid obesity (HCC)   Leukocytosis   Essential hypertension   GERD (gastroesophageal reflux disease)   Cellulitis, abdominal wall   DM type 2 (diabetes mellitus, type 2) (HCC)   OSA (obstructive sleep apnea)   Rhabdomyolysis   Pressure injury of skin  Body mass index is 92.55 kg/m.  (Morbid obesity)  Acute kidney injury: Creatinine is improving.   -Okay to stop fluids - Continue diet   Hypokalemia: Replete potassium and  monitor levels  Traumatic rhabdomyolysis: Improved  Panniculitis and Proteus mirabilis UTI: Completed Keflex and oral Zyvox on 5/11  8.4 cm right renal mass, bilateral nonobstructing nephrolithiasis:  - MRI with and without contrast will be ordered for further evaluation when kidney function improves.  Consulted Dr. Gloriann Loan, urologist, for further evaluation of renal mass.  He recommended outpatient follow-up. Chart review shows that patient had MRI of the lumbar spine in July 2020 and a large cystic mass measuring 6.8 cm was found in the right kidney.  Patient says she was scheduled to see a urologist outpatient; appt will need to be re-scheduled  S/p fall at home:  -Patient able to ambulate today with PT - She is requesting CIR evaluation  Chronic anemia, iron deficiency anemia: Monitor H&H.  No indication for blood transfusion at this time.  Other comorbidities include hypertension, anxiety, depression, restless leg syndrome, chronic anemia   Old records reviewed in assessment of this patient  Antimicrobials:   DVT prophylaxis: Lovenox   Code Status:   Code Status: Full Code Family Communication:   Disposition Plan: Status is: Inpatient  Remains inpatient appropriate because:Inpatient level of care appropriate due to severity of illness   Dispo: The patient is from: Home              Anticipated d/c is to: pending CIR eval              Patient currently is medically stable to d/c.   Difficult to place patient No  Risk of unplanned readmission score: Unplanned Admission- Pilot do  not use: 12.72   Objective: Blood pressure (!) 142/78, pulse 88, temperature 98.5 F (36.9 C), temperature source Oral, resp. rate 20, height 5\' 3"  (1.6 m), weight (!) 236.8 kg, SpO2 98 %.  Examination: General appearance: alert, cooperative and no distress Head: Normocephalic, without obvious abnormality, atraumatic Eyes: EOMI Lungs: clear to auscultation bilaterally Heart: regular  rate and rhythm and S1, S2 normal Abdomen: obese, skin excoriations noted in lower abdominal folds.  No significant erythema Extremities: Obese Skin: mobility and turgor normal Neurologic: No focal deficits  Consultants:     Procedures:     Data Reviewed: I have personally reviewed following labs and imaging studies Results for orders placed or performed during the hospital encounter of 08/09/20 (from the past 24 hour(s))  Glucose, capillary     Status: Abnormal   Collection Time: 08/15/20  9:56 PM  Result Value Ref Range   Glucose-Capillary 104 (H) 70 - 99 mg/dL  Basic metabolic panel     Status: Abnormal   Collection Time: 08/16/20  4:07 AM  Result Value Ref Range   Sodium 141 135 - 145 mmol/L   Potassium 4.5 3.5 - 5.1 mmol/L   Chloride 113 (H) 98 - 111 mmol/L   CO2 21 (L) 22 - 32 mmol/L   Glucose, Bld 77 70 - 99 mg/dL   BUN 14 6 - 20 mg/dL   Creatinine, Ser 1.66 (H) 0.44 - 1.00 mg/dL   Calcium 8.4 (L) 8.9 - 10.3 mg/dL   GFR, Estimated 39 (L) >60 mL/min   Anion gap 7 5 - 15  Magnesium     Status: None   Collection Time: 08/16/20  4:07 AM  Result Value Ref Range   Magnesium 1.9 1.7 - 2.4 mg/dL  Glucose, capillary     Status: None   Collection Time: 08/16/20  7:37 AM  Result Value Ref Range   Glucose-Capillary 73 70 - 99 mg/dL  Glucose, capillary     Status: None   Collection Time: 08/16/20 12:05 PM  Result Value Ref Range   Glucose-Capillary 82 70 - 99 mg/dL  Glucose, capillary     Status: None   Collection Time: 08/16/20  4:04 PM  Result Value Ref Range   Glucose-Capillary 77 70 - 99 mg/dL    Recent Results (from the past 240 hour(s))  Blood culture (routine x 2)     Status: None   Collection Time: 08/10/20  5:20 AM   Specimen: BLOOD LEFT HAND  Result Value Ref Range Status   Specimen Description   Final    BLOOD LEFT HAND Performed at [facility], [place] [address] [place], [place] [address]    Special Requests   Final    BOTTLES DRAWN  AEROBIC ONLY Blood Culture results may not be optimal due to an inadequate volume of blood received in culture bottles Performed at [place] [address], [place], [place] [address]    Culture   Final    NO GROWTH 5 DAYS Performed at [facility], [place] [address] [place], [place] [address]    Report Status 08/15/2020 FINAL  Final  Resp Panel by RT-PCR (Flu A&B, Covid) Nasopharyngeal Swab     Status: None   Collection Time: 08/10/20  7:42 AM   Specimen: Nasopharyngeal Swab; Nasopharyngeal(NP) swabs in vial transport medium  Result Value Ref Range Status   SARS Coronavirus 2 by RT PCR NEGATIVE NEGATIVE Final    Comment: (NOTE) SARS-CoV-2 target nucleic acids  are NOT DETECTED.  The SARS-CoV-2 RNA is generally detectable in upper respiratory specimens during the acute phase of infection. The lowest concentration of SARS-CoV-2 viral copies this assay can detect is 138 copies/mL. A negative result does not preclude SARS-Cov-2 infection and should not be used as the sole basis for treatment or other patient management decisions. A negative result may occur with  improper specimen collection/handling, submission of specimen other than nasopharyngeal swab, presence of viral mutation(s) within the areas targeted by this assay, and inadequate number of viral copies(<138 copies/mL). A negative result must be combined with clinical observations, patient history, and epidemiological information. The expected result is Negative.  Fact Sheet for Patients:  EntrepreneurPulse.com.au  Fact Sheet for Healthcare Providers:  IncredibleEmployment.be  This test is no t yet approved or cleared by the [place] FDA and  has been authorized for detection and/or diagnosis of SARS-CoV-2 by FDA under an Emergency Use Authorization (EUA). This EUA will remain  in effect (meaning this test can be used) for the duration of the COVID-19  declaration under Section 564(b)(1) of the Act, 21 U.S.C.section 360bbb-3(b)(1), unless the authorization is terminated  or revoked sooner.       Influenza A by PCR NEGATIVE NEGATIVE Final   Influenza B by PCR NEGATIVE NEGATIVE Final    Comment: (NOTE) The Xpert Xpress SARS-CoV-2/FLU/RSV plus assay is intended as an aid in the diagnosis of influenza from Nasopharyngeal swab specimens and should not be used as a sole basis for treatment. Nasal washings and aspirates are unacceptable for Xpert Xpress SARS-CoV-2/FLU/RSV testing.  Fact Sheet for Patients: EntrepreneurPulse.com.au  Fact Sheet for Healthcare Providers: IncredibleEmployment.be  This test is not yet approved or cleared by the [place] FDA and has been authorized for detection and/or diagnosis of SARS-CoV-2 by FDA under an Emergency Use Authorization (EUA). This EUA will remain in effect (meaning this test can be used) for the duration of the COVID-19 declaration under Section 564(b)(1) of the Act, 21 U.S.C. section 360bbb-3(b)(1), unless the authorization is terminated or revoked.  Performed at [facility], [place] [address] [place], [place] [address]   Culture, Urine     Status: Abnormal   Collection Time: 08/11/20  9:37 AM   Specimen: Urine, Catheterized  Result Value Ref Range Status   Specimen Description   Final    URINE, CATHETERIZED Performed at [place] [address] [place], [place] [address]    Special Requests   Final    Normal Performed at [facility], [place] [address] [place], [place] [address]    Culture >=100,000 COLONIES/mL PROTEUS MIRABILIS (A)  Final   Report Status 08/13/2020 FINAL  Final   Organism ID, Bacteria PROTEUS MIRABILIS (A)  Final      Susceptibility   Proteus mirabilis - MIC*    AMPICILLIN <=2 SENSITIVE Sensitive     CEFAZOLIN <=4 SENSITIVE Sensitive     CEFEPIME <=0.12  SENSITIVE Sensitive     CEFTRIAXONE <=0.25 SENSITIVE Sensitive     CIPROFLOXACIN <=0.25 SENSITIVE Sensitive     GENTAMICIN 4 SENSITIVE Sensitive     IMIPENEM 2 SENSITIVE Sensitive     NITROFURANTOIN RESISTANT Resistant     TRIMETH/SULFA <=20 SENSITIVE Sensitive     AMPICILLIN/SULBACTAM <=2 SENSITIVE Sensitive     PIP/TAZO <=4 SENSITIVE Sensitive     * >=100,000 COLONIES/mL PROTEUS MIRABILIS  Aerobic Culture w Gram Stain (superficial specimen)     Status: None   Collection Time: 08/12/20 12:46  PM   Specimen: Wound  Result Value Ref Range Status   Specimen Description WOUND  Final   Special Requests NONE  Final   Gram Stain   Final    RARE WBC PRESENT, PREDOMINANTLY MONONUCLEAR NO ORGANISMS SEEN    Culture RARE ESCHERICHIA COLI FEW ENTEROCOCCUS FAECALIS   Final   Report Status 08/16/2020 FINAL  Final   Organism ID, Bacteria ESCHERICHIA COLI  Final   Organism ID, Bacteria ENTEROCOCCUS FAECALIS  Final      Susceptibility   Escherichia coli - MIC*    AMPICILLIN <=2 SENSITIVE Sensitive     CEFAZOLIN <=4 SENSITIVE Sensitive     CEFEPIME <=0.12 SENSITIVE Sensitive     CEFTAZIDIME <=1 SENSITIVE Sensitive     CEFTRIAXONE <=0.25 SENSITIVE Sensitive     CIPROFLOXACIN <=0.25 SENSITIVE Sensitive     GENTAMICIN <=1 SENSITIVE Sensitive     IMIPENEM <=0.25 SENSITIVE Sensitive     TRIMETH/SULFA <=20 SENSITIVE Sensitive     AMPICILLIN/SULBACTAM <=2 SENSITIVE Sensitive     PIP/TAZO <=4 SENSITIVE Sensitive     * RARE ESCHERICHIA COLI   Enterococcus faecalis - MIC*    AMPICILLIN <=2 SENSITIVE Sensitive     LEVOFLOXACIN 1 SENSITIVE Sensitive     VANCOMYCIN 1 SENSITIVE Sensitive     GENTAMICIN SYNERGY Value in next row Sensitive      SENSITIVEPerformed at [facility], [address], [place], [place] [address]    * FEW ENTEROCOCCUS FAECALIS     Radiology Studies: No results found. US RENAL  Final Result    DG Chest Portable 1 View  Final Result    DG Knee Left Port  Final  Result    DG Knee Right Port  Final Result    DG Pelvis 1-2 Views  Final Result    DG Lumbar Spine 2-3 Views  Final Result    CT Head Wo Contrast    (Results Pending)  CT ABDOMEN PELVIS WO CONTRAST    (Results Pending)    Scheduled Meds: . buPROPion  300 mg Oral Daily  . busPIRone  20 mg Oral BID  . cephALEXin  500 mg Oral Q6H  . chlorhexidine  15 mL Mouth Rinse BID  . Chlorhexidine Gluconate Cloth  6 each Topical Daily  . cholecalciferol  5,000 Units Oral Daily  . DULoxetine  60 mg Oral Daily  . enoxaparin (LOVENOX) injection  100 mg Subcutaneous Q24H  . insulin aspart  0-5 Units Subcutaneous QHS  . insulin aspart  0-9 Units Subcutaneous TID WC  . linezolid  600 mg Oral Q12H  . mouth rinse  15 mL Mouth Rinse q12n4p  . montelukast  10 mg Oral QHS  . nystatin  1 application Topical TID  . QUEtiapine  25 mg Oral QHS  . rOPINIRole  4 mg Oral QHS  . silver sulfADIAZINE   Topical Daily  . vitamin B-12  5,000 mcg Oral Daily   PRN Meds: acetaminophen **OR** acetaminophen, albuterol, diphenhydrAMINE, lip balm, liver oil-zinc oxide, ondansetron **OR** ondansetron (ZOFRAN) IV, senna-docusate, sorbitol Continuous Infusions:   LOS: 6 days  Time spent: Greater than 50% of the 35 minute visit was spent in counseling/coordination of care for the patient as laid out in the A&P.   Dwyane Dee, MD [facility] 08/16/2020, 6:16 PM

## 2020-08-16 NOTE — Plan of Care (Signed)
  Problem: Education: Goal: Knowledge of General Education information will improve Description: Including pain rating scale, medication(s)/side effects and non-pharmacologic comfort measures Outcome: Adequate for Discharge   

## 2020-08-16 NOTE — Clinical Social Work Note (Cosign Needed)
30 Day Passar Note  RE: Heather [name]        Date of Birth: _12-08-1977_   Date: 08/16/2020       To Whom It May Concern:  Please be advised that the above-named patient will require a short-term nursing home stay - anticipated 30 days or less for rehabilitation and strengthening.  The plan is for return home.   Evette Cristal, MSW, Marlinda Mike [phone]

## 2020-08-16 NOTE — TOC Initial Note (Signed)
Transition of Care [facility]) - Initial/Assessment Note    Patient Details  Name: Heather [name] MRN: [id number] Date of Birth: Dec 07, 1975  Transition of Care [facility]) CM/SW Contact:    Ross Ludwig, LCSW Phone Number: 08/16/2020, 2:12 PM  Clinical Narrative:                  CSW received consult for patient needing to go to SNF for short term rehab before returning back home.  CSW discussed with supervisor to see what options may be possible for this patient.  Patient will need a SNF that can accept bariatric patients and Medicaid only.  CSW was informed to contact CIR, they screened patient and don't believe she can tolerate the amount of therapy that is required for CIR.  CSW to begin bed search for SNFs.  CSW to continue to follow patient's progress throughout discharge planning.  Expected Discharge Plan: Skilled Nursing Facility Barriers to Discharge: Continued Medical Work up   Patient Goals and CMS Choice Patient states their goals for this hospitalization and ongoing recovery are:: To go to short term rehab, then return back home. CMS Medicare.gov Compare Post Acute Care list provided to:: Patient Choice offered to / list presented to : Patient  Expected Discharge Plan and Services Expected Discharge Plan: [place] arrangements for the past 2 months: Apartment                                      Prior Living Arrangements/Services Living arrangements for the past 2 months: Apartment Lives with:: Minor Children,Parents Patient language and need for interpreter reviewed:: Yes Do you feel safe going back to the place where you live?: No   Patient needs some rehab before returning back home.    Care giver support system in place?: Yes (comment)   Criminal Activity/Legal Involvement Pertinent to Current Situation/Hospitalization: No - Comment as needed  Activities of Daily Living Home Assistive Devices/Equipment: Gilford Rile (specify  type) ADL Screening (condition at time of admission) Patient's cognitive ability adequate to safely complete daily activities?: Yes Is the patient deaf or have difficulty hearing?: No Does the patient have difficulty seeing, even when wearing glasses/contacts?: No Does the patient have difficulty concentrating, remembering, or making decisions?: No Patient able to express need for assistance with ADLs?: Yes Does the patient have difficulty dressing or bathing?: Yes Independently performs ADLs?: No Communication: Independent Dressing (OT): Needs assistance Is this a change from baseline?: Change from baseline, expected to last >3 days Grooming: Needs assistance Is this a change from baseline?: Change from baseline, expected to last >3 days Feeding: Needs assistance Is this a change from baseline?: Change from baseline, expected to last >3 days Bathing: Needs assistance Is this a change from baseline?: Change from baseline, expected to last >3 days Toileting: Dependent Is this a change from baseline?: Change from baseline, expected to last >3days In/Out Bed: Dependent Is this a change from baseline?: Change from baseline, expected to last >3 days Walks in Home: Dependent Is this a change from baseline?: Change from baseline, expected to last >3 days Does the patient have difficulty walking or climbing stairs?: Yes (secondary to pain and weakness) Weakness of Legs: Both Weakness of Arms/Hands: None  Permission Sought/Granted Permission sought to share information with : Facility Contact Representative,Family Supports Permission granted to share information with : Yes, Release of Information  Signed  Share Information with NAME: Darrah,Dawn Mother [phone]    Marcha Solders   [phone]  Permission granted to share info w AGENCY: SNF admissions        Emotional Assessment Appearance:: Appears stated age   Affect (typically observed):  Appropriate,Accepting,Calm Orientation: : Oriented to Self,Oriented to Place,Oriented to  Time,Oriented to Situation Alcohol / Substance Use: Not Applicable Psych Involvement: Yes (comment)  Admission diagnosis:  Rhabdomyolysis [M62.82] Panniculitis [M79.3] Fall [W19.XXXA] Traumatic rhabdomyolysis, initial encounter ([place]) [T79.6XXA] Patient Active Problem List   Diagnosis Date Noted  . Pressure injury of skin 08/11/2020  . Rhabdomyolysis 08/10/2020  . Acute renal failure ([place]) 08/10/2020  . PTSD (post-traumatic stress disorder) 05/15/2020  . Major depressive disorder, recurrent episode, moderate ([place]) 05/05/2020  . Generalized anxiety disorder 05/05/2020  . Blood in stool   . Gastritis and gastroduodenitis   . Benign neoplasm of sigmoid colon   . Difficult intubation 08/10/2018  . Class 3 severe obesity with serious comorbidity and body mass index (BMI) greater than or equal to 70 in adult ([place]) 06/16/2018  . Nexplanon insertion 03/27/2017  . Postpartum hypertension 03/05/2017  . Status post primary low transverse cesarean section 02/03/2017  . H/O pre-eclampsia 01/27/2017  . Gestational htn w/o significant proteinuria, third trimester 01/20/2017  . Mild persistent asthma without complication 83/15/1761  . Perennial allergic rhinitis 11/05/2016  . Mild persistent asthma with acute exacerbation 11/05/2016  . Excess weight gain in pregnancy, second trimester 10/30/2016  . Hard to intubate 11/07/2015  . Hypoglycemia 11/07/2015  . OSA (obstructive sleep apnea) 11/07/2015  . DM type 2 (diabetes mellitus, type 2) ([place]) 12/07/2014  . Panniculitis 12/06/2014  . Cellulitis, abdominal wall 11/11/2014  . Abdominal pain 07/14/2014  . Loose stools 07/14/2014  . Melena 01/27/2014  . Eosinophilic esophagitis 60/73/7106  . Change in bowel habits 04/28/2013  . Esophageal dysphagia 04/28/2013  . Insomnia due to mental disorder(327.02) 08/08/2011  . RLS (restless legs syndrome) 08/08/2011   . PALPITATIONS, OCCASIONAL 11/01/2009  . DISORDER, TOBACCO USE 08/25/2008  . Leukocytosis 07/28/2008  . ALLERGIC RHINITIS, SEASONAL 08/20/2007  . DYSMETABOLIC SYNDROME 26/94/8546  . Morbid obesity ([place]) 05/13/2006  . EXTERNAL HEMORRHOIDS 05/13/2006  . HYPERLIPIDEMIA 05/12/2006  . Essential hypertension 05/12/2006  . ASTHMA 05/12/2006  . GERD (gastroesophageal reflux disease) 05/12/2006  . OSTEOARTHRITIS 05/12/2006   PCP:  Lucianne Lei, MD Pharmacy:   [facility] [place], [place] - [place] AT [address] [place] [address] Phone: [phone] Fax: [phone]     Social Determinants of Health (SDOH) Interventions    Readmission Risk Interventions No flowsheet data found.

## 2020-08-16 NOTE — Progress Notes (Signed)
Physical Therapy Treatment Patient Details Name: Heather [name] MRN: [id number] DOB: Apr 09, 1975 Today's Date: 08/16/2020    History of Present Illness Patient is a 45 year old female admitted 5/4 with rhabdomyolsis after fall at home and unable to get up. PMH includes super morbid obesity with BMI of 89.46, sleep apnea untreated, chronic anemia, COPD, depression, type 2 diabetes not on treatment, restless leg syndrome, affective disorder bipolar type.    PT Comments    Pt assisted with sitting EOB and then only min/guard assist for sit to stands and ambulating short distance in room.  Pt reports she will not d/c to SNF (has been there and did not like the experience).  Pt hopes [facility] or return "home" which she reports is not the apartment she came from.  If home, plans to d/c to motel where she will not have to perform stairs.   Follow Up Recommendations  [facility]     Equipment Recommendations  Rolling walker with 5" wheels    Recommendations for Other Services       Precautions / Restrictions Precautions Precautions: Fall Precaution Comments: multiple wounds in skin folds    Mobility  Bed Mobility Overal bed mobility: Needs Assistance Bed Mobility: Supine to Sit;Sit to Supine     Supine to sit: Mod assist;HOB elevated Sit to supine: Mod assist   General bed mobility comments: pt initiating and assisting, requires assist for mostly trunk upright and then LEs with return to bed    Transfers Overall transfer level: Needs assistance Equipment used: Rolling walker (2 wheeled) Transfers: Sit to/from Stand Sit to Stand: Min guard         General transfer comment: verbal cues for technique, pt requests to attempt without assist so min/guard for safety, increased time to rise however no physical asssist required today; performed twice  Ambulation/Gait Ambulation/Gait assistance: Min guard Gait Distance (Feet): 18 Feet Assistive device:  Rolling walker (2 wheeled) Gait Pattern/deviations: Step-through pattern;Decreased stride length;Wide base of support Gait velocity: decreased   General Gait Details: ambulated 18 feet around room over to window seat area for seated rest break (no bari-recliner available at this time per portable equipment) and then another 12 feet to return back to bed   Stairs             Wheelchair Mobility    Modified Rankin (Stroke Patients Only)       Balance                                            Cognition Arousal/Alertness: Awake/alert Behavior During Therapy: WFL for tasks assessed/performed Overall Cognitive Status: Within Functional Limits for tasks assessed                                        Exercises General Exercises - Lower Extremity Ankle Circles/Pumps: AROM;Both;5 reps;Other (comment);Seated (2 sets) Long Arc Quad: AROM;Both;5 reps;Seated (2 sets)    General Comments        Pertinent Vitals/Pain Pain Assessment: No/denies pain Pain Intervention(s): Monitored during session;Repositioned    Home Living                      Prior Function  PT Goals (current goals can now be found in the care plan section) Progress towards PT goals: Progressing toward goals    Frequency    Min 3X/week      PT Plan Current plan remains appropriate;Frequency needs to be updated    Co-evaluation              AM-PAC PT "6 Clicks" Mobility   Outcome Measure  Help needed turning from your back to your side while in a flat bed without using bedrails?: A Lot Help needed moving from lying on your back to sitting on the side of a flat bed without using bedrails?: A Lot Help needed moving to and from a bed to a chair (including a wheelchair)?: A Lot Help needed standing up from a chair using your arms (e.g., wheelchair or bedside chair)?: A Lot Help needed to walk in hospital room?: A Little Help needed  climbing 3-5 steps with a railing? : Total 6 Click Score: 12    End of Session   Activity Tolerance: Patient tolerated treatment well Patient left: in bed;with call bell/phone within reach   PT Visit Diagnosis: Difficulty in walking, not elsewhere classified (R26.2);Muscle weakness (generalized) (M62.81)     Time: 7782-4235 PT Time Calculation (min) (ACUTE ONLY): 25 min  Charges:  $Gait Training: 23-37 mins                     Jannette Spanner PT, DPT Acute Rehabilitation Services Pager: [phone] Office: [phone]  York Ram E 08/16/2020, 2:59 PM

## 2020-08-16 NOTE — NC FL2 (Signed)
[place] LEVEL OF CARE SCREENING TOOL     IDENTIFICATION  Patient Name: Heather [name] Birthdate: [birth year] Sex: female Admission Date (Current Location): 08/09/2020  County and [place] Number:  [name] [id number] [place] and Address:  [facility],  [address], [place]      Provider Number: [id number]  Attending Physician Name and Address:  Dwyane Dee, MD  Relative Name and Phone Number:  Darrah,Dawn Mother [phone]    Marcha Solders   [phone]    Current Level of Care: Hospital Recommended Level of Care: [place] Prior Approval Number:    Date Approved/Denied:   PASRR Number: Pending  Discharge Plan: SNF    Current Diagnoses: Patient Active Problem List   Diagnosis Date Noted  . Pressure injury of skin 08/11/2020  . Rhabdomyolysis 08/10/2020  . Acute renal failure ([place]) 08/10/2020  . PTSD (post-traumatic stress disorder) 05/15/2020  . Major depressive disorder, recurrent episode, moderate ([place]) 05/05/2020  . Generalized anxiety disorder 05/05/2020  . Blood in stool   . Gastritis and gastroduodenitis   . Benign neoplasm of sigmoid colon   . Difficult intubation 08/10/2018  . Class 3 severe obesity with serious comorbidity and body mass index (BMI) greater than or equal to 70 in adult ([place]) 06/16/2018  . Nexplanon insertion 03/27/2017  . Postpartum hypertension 03/05/2017  . Status post primary low transverse cesarean section 02/03/2017  . H/O pre-eclampsia 01/27/2017  . Gestational htn w/o significant proteinuria, third trimester 01/20/2017  . Mild persistent asthma without complication 04/16/3233  . Perennial allergic rhinitis 11/05/2016  . Mild persistent asthma with acute exacerbation 11/05/2016  . Excess weight gain in pregnancy, second trimester 10/30/2016  . Hard to intubate 11/07/2015  . Hypoglycemia 11/07/2015  . OSA (obstructive sleep apnea) 11/07/2015  . DM type 2  (diabetes mellitus, type 2) ([place]) 12/07/2014  . Panniculitis 12/06/2014  . Cellulitis, abdominal wall 11/11/2014  . Abdominal pain 07/14/2014  . Loose stools 07/14/2014  . Melena 01/27/2014  . Eosinophilic esophagitis 57/32/2025  . Change in bowel habits 04/28/2013  . Esophageal dysphagia 04/28/2013  . Insomnia due to mental disorder(327.02) 08/08/2011  . RLS (restless legs syndrome) 08/08/2011  . PALPITATIONS, OCCASIONAL 11/01/2009  . DISORDER, TOBACCO USE 08/25/2008  . Leukocytosis 07/28/2008  . ALLERGIC RHINITIS, SEASONAL 08/20/2007  . DYSMETABOLIC SYNDROME 42/70/6237  . Morbid obesity ([place]) 05/13/2006  . EXTERNAL HEMORRHOIDS 05/13/2006  . HYPERLIPIDEMIA 05/12/2006  . Essential hypertension 05/12/2006  . ASTHMA 05/12/2006  . GERD (gastroesophageal reflux disease) 05/12/2006  . OSTEOARTHRITIS 05/12/2006    Orientation RESPIRATION BLADDER Height & Weight     Self,Time,Situation,Place  Normal Incontinent Weight: (!) 522 lb (236.8 kg) (bed weight) Height:  5\' 3"  (160 cm)  BEHAVIORAL SYMPTOMS/MOOD NEUROLOGICAL BOWEL NUTRITION STATUS      Continent Diet (Carb Modified diet)  AMBULATORY STATUS COMMUNICATION OF NEEDS Skin   Limited Assist Verbally PU Stage and Appropriate Care   PU Stage 2 Dressing:  (PRN dressing changes) PU Stage 3 Dressing:  (PRN dressing changes)                 Personal Care Assistance Level of Assistance  Bathing,Dressing,Feeding Bathing Assistance: Limited assistance Feeding assistance: Independent Dressing Assistance: Limited assistance     Functional Limitations Info  Sight,Hearing,Speech Sight Info: Adequate Hearing Info: Adequate Speech Info: Adequate    SPECIAL CARE FACTORS FREQUENCY  PT (By licensed PT),OT (By licensed OT)     PT Frequency: Minimum 5x a week OT Frequency:  Minimum 5x a week            Contractures Contractures Info: Not present    Additional Factors Info  Code Status,Allergies Code Status Info: Full  Code Allergies Info: Bee Venom   Penicillins   Penicillin G   Adhesive Latex   Vancomycin           Current Medications (08/16/2020):  This is the current hospital active medication list Current Facility-Administered Medications  Medication Dose Route Frequency Provider Last Rate Last Admin  . acetaminophen (TYLENOL) tablet 650 mg  650 mg Oral Q6H PRN Barb Merino, MD       Or  . acetaminophen (TYLENOL) suppository 650 mg  650 mg Rectal Q6H PRN Barb Merino, MD      . albuterol (PROVENTIL) (2.5 MG/3ML) 0.083% nebulizer solution 3 mL  3 mL Inhalation Q4H PRN Barb Merino, MD      . buPROPion (WELLBUTRIN XL) 24 hr tablet 300 mg  300 mg Oral Daily Barb Merino, MD   300 mg at 08/16/20 0952  . busPIRone (BUSPAR) tablet 20 mg  20 mg Oral BID Barb Merino, MD   20 mg at 08/16/20 0952  . cephALEXin (KEFLEX) capsule 500 mg  500 mg Oral Q6H Jennye Boroughs, MD   500 mg at 08/16/20 1318  . chlorhexidine (PERIDEX) 0.12 % solution 15 mL  15 mL Mouth Rinse BID Barb Merino, MD   15 mL at 08/16/20 0952  . Chlorhexidine Gluconate Cloth 2 % PADS 6 each  6 each Topical Daily Dahal, Marlowe Aschoff, MD   6 each at 08/16/20 0953  . cholecalciferol (VITAMIN D) tablet 5,000 Units  5,000 Units Oral Daily Shawna Clamp, MD   5,000 Units at 08/16/20 [phone]  . diphenhydrAMINE (BENADRYL) capsule 25 mg  25 mg Oral Q8H PRN Barb Merino, MD   25 mg at 08/10/20 0900  . DULoxetine (CYMBALTA) DR capsule 60 mg  60 mg Oral Daily Barb Merino, MD   60 mg at 08/16/20 0952  . enoxaparin (LOVENOX) injection 100 mg  100 mg Subcutaneous Q24H Barb Merino, MD   100 mg at 08/15/20 2224  . insulin aspart (novoLOG) injection 0-5 Units  0-5 Units Subcutaneous QHS Ghimire, Dante Gang, MD      . insulin aspart (novoLOG) injection 0-9 Units  0-9 Units Subcutaneous TID WC Ghimire, Dante Gang, MD      . linezolid (ZYVOX) tablet 600 mg  600 mg Oral Q12H Jennye Boroughs, MD   600 mg at 08/16/20 0951  . lip balm (CARMEX) ointment   Topical PRN  Barb Merino, MD      . liver oil-zinc oxide (DESITIN) 40 % ointment   Topical PRN Shawna Clamp, MD   Given at 08/13/20 1000  . MEDLINE mouth rinse  15 mL Mouth Rinse q12n4p Barb Merino, MD   15 mL at 08/16/20 1318  . montelukast (SINGULAIR) tablet 10 mg  10 mg Oral QHS Barb Merino, MD   10 mg at 08/15/20 2225  . nystatin (MYCOSTATIN/NYSTOP) topical powder 1 application  1 application Topical TID Barb Merino, MD   1 application at 57/26/20 0953  . ondansetron (ZOFRAN) tablet 4 mg  4 mg Oral Q6H PRN Barb Merino, MD       Or  . ondansetron (ZOFRAN) injection 4 mg  4 mg Intravenous Q6H PRN Barb Merino, MD      . QUEtiapine (SEROQUEL) tablet 25 mg  25 mg Oral QHS Barb Merino, MD   25 mg at 08/15/20 2227  .  rOPINIRole (REQUIP) tablet 4 mg  4 mg Oral QHS Barb Merino, MD   4 mg at 08/15/20 2222  . senna-docusate (Senokot-S) tablet 1 tablet  1 tablet Oral QHS PRN Barb Merino, MD      . silver sulfADIAZINE (SILVADENE) 1 % cream   Topical Daily Jennye Boroughs, MD   Given at 08/16/20 [phone]  . sorbitol 70 % solution 30 mL  30 mL Oral Daily PRN Barb Merino, MD      . vitamin B-12 (CYANOCOBALAMIN) tablet 5,000 mcg  5,000 mcg Oral Daily Barb Merino, MD   5,000 mcg at 08/16/20 7371     Discharge Medications: Please see discharge summary for a list of discharge medications.  Relevant Imaging Results:  Relevant Lab Results:   Additional Information SSN [id number]  Ross Ludwig, LCSW

## 2020-08-17 LAB — GLUCOSE, CAPILLARY
Glucose-Capillary: 77 mg/dL (ref 70–99)
Glucose-Capillary: 90 mg/dL (ref 70–99)
Glucose-Capillary: 104 mg/dL — ABNORMAL HIGH (ref 70–99)
Glucose-Capillary: 90 mg/dL (ref 70–99)

## 2020-08-17 NOTE — Progress Notes (Signed)
Progress Note    Heather [name] President   ZOX:[id number]  DOB: 1975/11/08  DOA: 08/09/2020     7  PCP: Lucianne Lei, MD  CC: fall at home  Hospital Course: Heather [name] is a 45 y.o. female with medical history significant for morbid obesity, obstructive sleep apnea, chronic anemia, COPD, depression, anxiety, type II DM, ambulatory dysfunction, restlessness syndrome, bipolar disorder, right renal mass.  She was brought to the hospital because of a fall at home. Reportedly, her mom had called EMS because patient fell on the floor and had been unable to get up for 48 hours.  She complained of painful lesions on her lower abdomen/pannus.  She was admitted to the hospital for acute kidney injury, traumatic rhabdomyolysis, panniculitis and UTI. She was treated with IV fluids and empiric antibiotics.  Urine culture revealed Proteus mirabilis.  She was evaluated by PT and OT who recommended discharge to SNF.  Interval History:  No events overnight.  Did relatively well with physical therapy yesterday.  Evaluated by CIR but not felt to be a candidate.  Tentative plan is for her to discharge home as she still wishes to go home of her going to another rehab.  ROS: Constitutional: negative for chills and fevers, Respiratory: negative for cough, Cardiovascular: negative for chest pain and Gastrointestinal: negative for abdominal pain  Assessment & Plan: Principal Problem:   Acute renal failure (HCC) Active Problems:   Morbid obesity (HCC)   Leukocytosis   Essential hypertension   GERD (gastroesophageal reflux disease)   Cellulitis, abdominal wall   DM type 2 (diabetes mellitus, type 2) (HCC)   OSA (obstructive sleep apnea)   Rhabdomyolysis   Pressure injury of skin  Panniculitis  Proteus mirabilis UTI:  -Completed Keflex and oral Zyvox on 5/11 - Per WOC: "Continue antimicrobial wicking fabric for coverage of yeast and moisture management. Add silvadene for topical care to open lesions.  Make sure that she is bathed and all products removed completely, allowed to dry and reapplied."   Acute kidney injury - Creatinine is improving.   - Okay to stop fluids - Continue diet   Body mass index is 92.55 kg/m.  (Morbid obesity)  Hypokalemia: Replete potassium and monitor levels  Traumatic rhabdomyolysis: Improved  8.4 cm right renal mass, bilateral nonobstructing nephrolithiasis:  - MRI with and without contrast will be ordered for further evaluation when kidney function improves.  Consulted Dr. Gloriann Loan, urologist, for further evaluation of renal mass.  He recommended outpatient follow-up. Chart review shows that patient had MRI of the lumbar spine in July 2020 and a large cystic mass measuring 6.8 cm was found in the right kidney.   -Patient says she was scheduled to see a urologist outpatient; appt will need to be re-scheduled  S/p fall at home:  -Patient able to ambulate today with PT - She is requesting CIR evaluation  Chronic anemia Iron deficiency anemia - monitor H&H.  No indication for blood transfusion at this time.  Other comorbidities include hypertension, anxiety, depression, restless leg syndrome   Old records reviewed in assessment of this patient  DVT prophylaxis: Lovenox   Code Status:   Code Status: Full Code Family Communication:   Disposition Plan: Status is: Inpatient  Remains inpatient appropriate because:Inpatient level of care appropriate due to severity of illness   Dispo: The patient is from: Home              Anticipated d/c is to: Home  Patient currently is medically stable to d/c.   Difficult to place patient No  Risk of unplanned readmission score: Unplanned Admission- Pilot do not use: 12.6   Objective: Blood pressure 140/73, pulse 90, temperature 98.6 F (37 C), temperature source Oral, resp. rate 18, height 5\' 3"  (1.6 m), weight (!) 236.8 kg, SpO2 96 %.  Examination: General appearance: alert, cooperative  and no distress Head: Normocephalic, without obvious abnormality, atraumatic Eyes: EOMI Lungs: clear to auscultation bilaterally Heart: regular rate and rhythm and S1, S2 normal Abdomen: obese, skin excoriations noted in lower abdominal folds.  No significant erythema Extremities: Obese Skin: mobility and turgor normal Neurologic: No focal deficits  Consultants:     Procedures:     Data Reviewed: I have personally reviewed following labs and imaging studies Results for orders placed or performed during the hospital encounter of 08/09/20 (from the past 24 hour(s))  Glucose, capillary     Status: None   Collection Time: 08/16/20  4:04 PM  Result Value Ref Range   Glucose-Capillary 77 70 - 99 mg/dL  Glucose, capillary     Status: None   Collection Time: 08/16/20  9:14 PM  Result Value Ref Range   Glucose-Capillary 96 70 - 99 mg/dL  Glucose, capillary     Status: None   Collection Time: 08/17/20  7:32 AM  Result Value Ref Range   Glucose-Capillary 77 70 - 99 mg/dL  Glucose, capillary     Status: None   Collection Time: 08/17/20 11:39 AM  Result Value Ref Range   Glucose-Capillary 90 70 - 99 mg/dL    Recent Results (from the past 240 hour(s))  Blood culture (routine x 2)     Status: None   Collection Time: 08/10/20  5:20 AM   Specimen: BLOOD LEFT HAND  Result Value Ref Range Status   Specimen Description   Final    BLOOD LEFT HAND Performed at [facility], [place] [address], [place], [place] [address]    Special Requests   Final    BOTTLES DRAWN AEROBIC ONLY Blood Culture results may not be optimal due to an inadequate volume of blood received in culture bottles Performed at [place] [address] [place], [place] [address]    Culture   Final    NO GROWTH 5 DAYS Performed at [facility], [place] [address], [place], [place] [address]    Report Status 08/15/2020 FINAL  Final  Resp Panel by RT-PCR (Flu A&B, Covid)  Nasopharyngeal Swab     Status: None   Collection Time: 08/10/20  7:42 AM   Specimen: Nasopharyngeal Swab; Nasopharyngeal(NP) swabs in vial transport medium  Result Value Ref Range Status   SARS Coronavirus 2 by RT PCR NEGATIVE NEGATIVE Final    Comment: (NOTE) SARS-CoV-2 target nucleic acids are NOT DETECTED.  The SARS-CoV-2 RNA is generally detectable in upper respiratory specimens during the acute phase of infection. The lowest concentration of SARS-CoV-2 viral copies this assay can detect is 138 copies/mL. A negative result does not preclude SARS-Cov-2 infection and should not be used as the sole basis for treatment or other patient management decisions. A negative result may occur with  improper specimen collection/handling, submission of specimen other than nasopharyngeal swab, presence of viral mutation(s) within the areas targeted by this assay, and inadequate number of viral copies(<138 copies/mL). A negative result must be combined with clinical observations, patient history, and epidemiological information. The expected result is Negative.  Fact Sheet for  Patients:  EntrepreneurPulse.com.au  Fact Sheet for Healthcare Providers:  IncredibleEmployment.be  This test is no t yet approved or cleared by the [place] FDA and  has been authorized for detection and/or diagnosis of SARS-CoV-2 by FDA under an Emergency Use Authorization (EUA). This EUA will remain  in effect (meaning this test can be used) for the duration of the COVID-19 declaration under Section 564(b)(1) of the Act, 21 U.S.C.section 360bbb-3(b)(1), unless the authorization is terminated  or revoked sooner.       Influenza A by PCR NEGATIVE NEGATIVE Final   Influenza B by PCR NEGATIVE NEGATIVE Final    Comment: (NOTE) The Xpert Xpress SARS-CoV-2/FLU/RSV plus assay is intended as an aid in the diagnosis of influenza from Nasopharyngeal swab specimens and should not be  used as a sole basis for treatment. Nasal washings and aspirates are unacceptable for Xpert Xpress SARS-CoV-2/FLU/RSV testing.  Fact Sheet for Patients: EntrepreneurPulse.com.au  Fact Sheet for Healthcare Providers: IncredibleEmployment.be  This test is not yet approved or cleared by the [place] FDA and has been authorized for detection and/or diagnosis of SARS-CoV-2 by FDA under an Emergency Use Authorization (EUA). This EUA will remain in effect (meaning this test can be used) for the duration of the COVID-19 declaration under Section 564(b)(1) of the Act, 21 U.S.C. section 360bbb-3(b)(1), unless the authorization is terminated or revoked.  Performed at [facility], [place] [address] [place], [place] [address]   Culture, Urine     Status: Abnormal   Collection Time: 08/11/20  9:37 AM   Specimen: Urine, Catheterized  Result Value Ref Range Status   Specimen Description   Final    URINE, CATHETERIZED Performed at [place] [address] [place], [place] [address]    Special Requests   Final    Normal Performed at [facility], [place] [address], [place], [place] [address]    Culture >=100,000 COLONIES/mL PROTEUS MIRABILIS (A)  Final   Report Status 08/13/2020 FINAL  Final   Organism ID, Bacteria PROTEUS MIRABILIS (A)  Final      Susceptibility   Proteus mirabilis - MIC*    AMPICILLIN <=2 SENSITIVE Sensitive     CEFAZOLIN <=4 SENSITIVE Sensitive     CEFEPIME <=0.12 SENSITIVE Sensitive     CEFTRIAXONE <=0.25 SENSITIVE Sensitive     CIPROFLOXACIN <=0.25 SENSITIVE Sensitive     GENTAMICIN 4 SENSITIVE Sensitive     IMIPENEM 2 SENSITIVE Sensitive     NITROFURANTOIN RESISTANT Resistant     TRIMETH/SULFA <=20 SENSITIVE Sensitive     AMPICILLIN/SULBACTAM <=2 SENSITIVE Sensitive     PIP/TAZO <=4 SENSITIVE Sensitive     * >=100,000 COLONIES/mL PROTEUS MIRABILIS  Aerobic Culture w  Gram Stain (superficial specimen)     Status: None   Collection Time: 08/12/20 12:46 PM   Specimen: Wound  Result Value Ref Range Status   Specimen Description WOUND  Final   Special Requests NONE  Final   Gram Stain   Final    RARE WBC PRESENT, PREDOMINANTLY MONONUCLEAR NO ORGANISMS SEEN    Culture RARE ESCHERICHIA COLI FEW ENTEROCOCCUS FAECALIS   Final   Report Status 08/16/2020 FINAL  Final   Organism ID, Bacteria ESCHERICHIA COLI  Final   Organism ID, Bacteria ENTEROCOCCUS FAECALIS  Final      Susceptibility   Escherichia coli - MIC*    AMPICILLIN <=2 SENSITIVE Sensitive     CEFAZOLIN <=4 SENSITIVE Sensitive     CEFEPIME <=0.12 SENSITIVE  Sensitive     CEFTAZIDIME <=1 SENSITIVE Sensitive     CEFTRIAXONE <=0.25 SENSITIVE Sensitive     CIPROFLOXACIN <=0.25 SENSITIVE Sensitive     GENTAMICIN <=1 SENSITIVE Sensitive     IMIPENEM <=0.25 SENSITIVE Sensitive     TRIMETH/SULFA <=20 SENSITIVE Sensitive     AMPICILLIN/SULBACTAM <=2 SENSITIVE Sensitive     PIP/TAZO <=4 SENSITIVE Sensitive     * RARE ESCHERICHIA COLI   Enterococcus faecalis - MIC*    AMPICILLIN <=2 SENSITIVE Sensitive     LEVOFLOXACIN 1 SENSITIVE Sensitive     VANCOMYCIN 1 SENSITIVE Sensitive     GENTAMICIN SYNERGY Value in next row Sensitive      SENSITIVEPerformed at [place] [address] [place], [place] [address]    * FEW ENTEROCOCCUS FAECALIS     Radiology Studies: No results found. US RENAL  Final Result    DG Chest Portable 1 View  Final Result    DG Knee Left Port  Final Result    DG Knee Right Port  Final Result    DG Pelvis 1-2 Views  Final Result    DG Lumbar Spine 2-3 Views  Final Result    CT Head Wo Contrast    (Results Pending)  CT ABDOMEN PELVIS WO CONTRAST    (Results Pending)    Scheduled Meds: . buPROPion  300 mg Oral Daily  . busPIRone  20 mg Oral BID  . chlorhexidine  15 mL Mouth Rinse BID  . Chlorhexidine Gluconate Cloth  6 each Topical Daily  .  cholecalciferol  5,000 Units Oral Daily  . DULoxetine  60 mg Oral Daily  . enoxaparin (LOVENOX) injection  100 mg Subcutaneous Q24H  . insulin aspart  0-5 Units Subcutaneous QHS  . insulin aspart  0-9 Units Subcutaneous TID WC  . mouth rinse  15 mL Mouth Rinse q12n4p  . montelukast  10 mg Oral QHS  . nystatin  1 application Topical TID  . QUEtiapine  25 mg Oral QHS  . rOPINIRole  4 mg Oral QHS  . silver sulfADIAZINE   Topical Daily  . vitamin B-12  5,000 mcg Oral Daily   PRN Meds: acetaminophen **OR** acetaminophen, albuterol, diphenhydrAMINE, lip balm, liver oil-zinc oxide, ondansetron **OR** ondansetron (ZOFRAN) IV, senna-docusate, sorbitol Continuous Infusions:   LOS: 7 days  Time spent: Greater than 50% of the 35 minute visit was spent in counseling/coordination of care for the patient as laid out in the A&P.   Dwyane Dee, MD [facility] 08/17/2020, 3:37 PM

## 2020-08-17 NOTE — Progress Notes (Signed)
Physical Therapy Treatment Patient Details Name: Heather [name] MRN: [id number] DOB: Jul 20, 1975 Today's Date: 08/17/2020    History of Present Illness Patient is a 45 year old female admitted 5/4 with rhabdomyolsis after fall at home and unable to get up. PMH includes super morbid obesity with BMI of 89.46, sleep apnea untreated, chronic anemia, COPD, depression, type 2 diabetes not on treatment, restless leg syndrome, affective disorder bipolar type.    PT Comments    Pt assisted with ambulating 30'x2 with seated rest break.  Provided bari-recliner and bari-BSC to room.  Pt also utilized BSC for BM and unable to complete hygiene without assist.  Pt agreeable to remain OOB and in recliner end of session.  Continue to recommend CIR upon d/c.  If pt does d/c home (motel), pt would benefit from bari-RW.    Follow Up Recommendations  CIR     Equipment Recommendations  Rolling walker with 5" wheels    Recommendations for Other Services       Precautions / Restrictions Precautions Precautions: Fall Precaution Comments: multiple wounds in skin folds    Mobility  Bed Mobility Overal bed mobility: Needs Assistance Bed Mobility: Supine to Sit     Supine to sit: Min guard;HOB elevated     General bed mobility comments: maintained air in bed this time, and pt able to perform bed mobility with min/guard (safety precautions to avoid pt sliding out of bed)    Transfers Overall transfer level: Needs assistance Equipment used: Rolling walker (2 wheeled) Transfers: Sit to/from Stand Sit to Stand: Min guard         General transfer comment: verbal cues for hand placement  Ambulation/Gait Ambulation/Gait assistance: Min guard Gait Distance (Feet): 30 Feet (x2) Assistive device: Rolling walker (2 wheeled) Gait Pattern/deviations: Step-through pattern;Decreased stride length;Wide base of support     General Gait Details: pt fatigued quickly and required seated rest break  however tolerated improved distance today (72ft x2)   Stairs             Wheelchair Mobility    Modified Rankin (Stroke Patients Only)       Balance                                            Cognition Arousal/Alertness: Awake/alert Behavior During Therapy: WFL for tasks assessed/performed Overall Cognitive Status: Within Functional Limits for tasks assessed                                        Exercises      General Comments        Pertinent Vitals/Pain Pain Assessment: Faces Faces Pain Scale: Hurts even more Pain Location: periarea with hygiene - multiple small red open wounds Pain Descriptors / Indicators: Sore;Tender Pain Intervention(s): Monitored during session;Repositioned    Home Living                      Prior Function            PT Goals (current goals can now be found in the care plan section) Progress towards PT goals: Progressing toward goals    Frequency    Min 3X/week      PT Plan Current plan remains appropriate    Co-evaluation  AM-PAC PT "6 Clicks" Mobility   Outcome Measure  Help needed turning from your back to your side while in a flat bed without using bedrails?: A Lot Help needed moving from lying on your back to sitting on the side of a flat bed without using bedrails?: A Little Help needed moving to and from a bed to a chair (including a wheelchair)?: A Little Help needed standing up from a chair using your arms (e.g., wheelchair or bedside chair)?: A Little Help needed to walk in hospital room?: A Little Help needed climbing 3-5 steps with a railing? : Total 6 Click Score: 15    End of Session   Activity Tolerance: Patient tolerated treatment well Patient left: with call bell/phone within reach;in chair;with nursing/sitter in room   PT Visit Diagnosis: Difficulty in walking, not elsewhere classified (R26.2);Muscle weakness (generalized) (M62.81)      Time: 4656-8127 PT Time Calculation (min) (ACUTE ONLY): 28 min  Charges:  $Gait Training: 8-22 mins $Therapeutic Activity: 8-22 mins                     Jannette Spanner PT, DPT Acute Rehabilitation Services Pager: [phone] Office: [phone]  York Ram E 08/17/2020, 3:37 PM

## 2020-08-17 NOTE — Progress Notes (Signed)
Inpatient Rehabilitation Admissions Coordinator  Inpatient rehab consult received . I reviewed chart and then contacted patient via phone for rehab assessment. Patient lived in second floor apartment with her Mom and 43 year old twins. Limited mobility over the past 3 years. EMS noted apartment with spiders, roaches and trash when EMS arrived. Since her admission, she has been evicted from her apartment and they have been relocated to [address] [place] by her [place]. She is going to court to appeal her eviction. The Motel is level entry with elevators. No stairs to maneuver to enter. Patient states her current hospital bed is difficult to get out of at [place], but feels she is mobilizing pretty well. CIR bed is not available for her admission and patient likely can progress to discharge home with family with additional DME acquisition. If not, other rehab venues need to be sought. I contacted SW, Randall Hiss, to discuss by phone and acute team and Dr. Sabino Gasser notified by secure chat. We will not pursue CIR admit . Please call with any questions.  Danne Baxter, RN, MSN Rehab Admissions Coordinator [phone] 08/17/2020 3:10 PM

## 2020-08-17 NOTE — Progress Notes (Signed)
Inpatient Rehab Admissions Coordinator:   Pt progressing well with therapy.  At this time we are recommending a CIR consult and I will place an order per our protocol.   Shann Medal, PT, DPT Admissions Coordinator [phone] 08/17/20  1:27 PM

## 2020-08-17 NOTE — TOC Progression Note (Addendum)
Transition of Care [facility]) - Progression Note    Patient Details  Name: Heather [name] MRN: [id number] Date of Birth: 1975/04/28  Transition of Care [facility]) CM/SW Contact  Ross Ludwig, [place] Phone Number: 08/17/2020, 1:49 PM  Clinical Narrative:     Per CIR patient is progressing well, CIR to put consult in for rescreen.  CSW to continue to follow patient's progress throughout discharge planning.  4:00pm  CSW was informed patient does not meet criteria for inpatient rehab anymore.  Patient does not want to go to SNF, CSW was able to contact [facility], and they are agreeable to accept patient for [facility] RN, PT, OT, and social work.  Patient will require a bariatric 3 in 1 and bariatric walker, CSW notified [employer] and can provide patient with equipment.  Patient will be staying at [facility] [place] airport with her mother and kids.  CSW to find out which room patient is in to notify [facility] agency.  5:48pm  Motel address [address], [place], [place] [address] [facility] has been notified    Expected Discharge Plan: [place] Barriers to Discharge: Continued Medical Work up  Expected Discharge Plan and Services Expected Discharge Plan: [place] arrangements for the past 2 months: Apartment                                       Social Determinants of Health (SDOH) Interventions    Readmission Risk Interventions No flowsheet data found.

## 2020-08-17 NOTE — Progress Notes (Signed)
Occupational Therapy Treatment Patient Details Name: Heather [name] MRN: [id number] DOB: November 21, 1975 Today's Date: 08/17/2020    History of present illness Patient is a 45 year old female admitted 5/4 with rhabdomyolsis after fall at home and unable to get up. PMH includes super morbid obesity with BMI of 89.46, sleep apnea untreated, chronic anemia, COPD, depression, type 2 diabetes not on treatment, restless leg syndrome, affective disorder bipolar type.   OT comments  Patient much improved from previous OT sessions regarding mobility and out of bed activity tolerance. Patient able to perform supine to sit with increased time and use of bed rails. Min G for safety with sit to stand from edge of bed, able to ambulate to window seat area with bari walker at min G assist. Patient set up for g/h tasks in sitting needing min A to comb hair. Patient reporting discomfort at back of legs while seated, unfortunately still no bari recliner in patient's room. Pt min G to ambulate back to bed and mod A to lift LEs into bed. Patient motivated to mobilize and get home as quickly as she can. Continue with POC.    Follow Up Recommendations  CIR    Equipment Recommendations  Other (comment) (bariatric bedside commode)       Precautions / Restrictions Precautions Precautions: Fall Precaution Comments: multiple wounds in skin folds       Mobility Bed Mobility Overal bed mobility: Needs Assistance Bed Mobility: Supine to Sit;Sit to Supine     Supine to sit: Min guard;HOB elevated Sit to supine: Mod assist   General bed mobility comments: with increased time and bed deflated patient was able to get up out of bed using bed rails. patient needing mod A to lift legs back into bed at end of session    Transfers Overall transfer level: Needs assistance Equipment used:  (bari walker) Transfers: Sit to/from Stand Sit to Stand: Min guard         General transfer comment: no assist to power up  to standing, min G for safety/steadying once standing    Balance Overall balance assessment: History of Falls;Needs assistance Sitting-balance support: Feet supported Sitting balance-Leahy Scale: Good     Standing balance support: Bilateral upper extremity supported Standing balance-Leahy Scale: Poor Standing balance comment: reliant on UE support in standing                           ADL either performed or assessed with clinical judgement   ADL Overall ADL's : Needs assistance/impaired     Grooming: Wash/dry face;Wash/dry hands;Brushing hair;Sitting;Minimal assistance Grooming Details (indicate cue type and reason): patient seated in window seat area able to wash her face and brush most of her hair needing min A due to fatigue and significant knots in back of head             Lower Body Dressing: Total assistance;Bed level Lower Body Dressing Details (indicate cue type and reason): total A to don socks in bed Toilet Transfer: Min guard;Ambulation (bari walker) Toilet Transfer Details (indicate cue type and reason): patient was able to power up to standing first from edge of bed, then from window seat area (no bari recliner) without physical assistance, min G once in standing for safety         Functional mobility during ADLs: Min guard;Rolling walker General ADL Comments: patient increasing out of bed activity tolerance for self care tasks, would benefit from [employer]  as window seat area does not have way to elevate legs and starts to irritate back of patient's legs with prolonged sitting               Cognition Arousal/Alertness: Awake/alert Behavior During Therapy: WFL for tasks assessed/performed Overall Cognitive Status: Within Functional Limits for tasks assessed                                                     Pertinent Vitals/ Pain       Pain Assessment: Faces Faces Pain Scale: Hurts a little bit Pain Location:  knees Pain Descriptors / Indicators: Aching Pain Intervention(s): Monitored during session         Frequency  Min 2X/week        Progress Toward Goals  OT Goals(current goals can now be found in the care plan section)  Progress towards OT goals: Progressing toward goals  Acute Rehab OT Goals Patient Stated Goal: "To be there for my kids." OT Goal Formulation: With patient Time For Goal Achievement: 08/25/20 Potential to Achieve Goals: Fair ADL Goals Pt/caregiver will Perform Home Exercise Program: Increased strength;Both right and left upper extremity;With theraband;Independently;With written HEP provided Additional ADL Goal #1: Patient will require mod A x2 for bed mobility in order to reduce caregiver burden and prepare for OOB activity Additional ADL Goal #2: Patient will tolerate sitting up in bariatric chair for 30 minutes in order to participate in self care tasks and maximize OOB activity tolerance needed to participate in daily routine.  Plan Discharge plan needs to be updated       AM-PAC OT "6 Clicks" Daily Activity     Outcome Measure   Help from another person eating meals?: None Help from another person taking care of personal grooming?: A Little Help from another person toileting, which includes using toliet, bedpan, or urinal?: A Lot Help from another person bathing (including washing, rinsing, drying)?: A Lot Help from another person to put on and taking off regular upper body clothing?: A Lot Help from another person to put on and taking off regular lower body clothing?: Total 6 Click Score: 14    End of Session Equipment Utilized During Treatment: Other (comment) (bari walker)  OT Visit Diagnosis: History of falling (Z91.81);Pain;Other abnormalities of gait and mobility (R26.89);Muscle weakness (generalized) (M62.81) Pain - Right/Left:  (bilateral) Pain - part of body: Knee   Activity Tolerance Patient tolerated treatment well   Patient Left in  bed;with call bell/phone within reach   Nurse Communication Mobility status        Time: 9242-6834 OT Time Calculation (min): 34 min  Charges: OT General Charges $OT Visit: 1 Visit OT Treatments $Self Care/Home Management : 23-37 mins  Delbert Phenix OT OT pager: [place] 08/17/2020, 8:40 AM

## 2020-08-17 NOTE — Plan of Care (Signed)
  Problem: Coping: Goal: Level of anxiety will decrease Outcome: Progressing   Problem: Elimination: Goal: Will not experience complications related to urinary retention Outcome: Progressing   Problem: Pain Managment: Goal: General experience of comfort will improve Outcome: Progressing   Problem: Safety: Goal: Ability to remain free from injury will improve Outcome: Progressing   Problem: Skin Integrity: Goal: Risk for impaired skin integrity will decrease Outcome: Progressing   

## 2020-08-18 ENCOUNTER — Inpatient Hospital Stay (HOSPITAL_COMMUNITY)
Admission: EM | Admit: 2020-08-18 | Discharge: 2020-08-24 | DRG: 682 | Disposition: A | Discharge status: Rehabilitation Facility | Payer: Medicaid Other | Attending: Internal Medicine | Admitting: Internal Medicine

## 2020-08-18 ENCOUNTER — Other Ambulatory Visit: Payer: Self-pay

## 2020-08-18 ENCOUNTER — Emergency Department (HOSPITAL_COMMUNITY): Payer: Medicaid Other

## 2020-08-18 ENCOUNTER — Encounter (HOSPITAL_COMMUNITY): Payer: Self-pay

## 2020-08-18 DIAGNOSIS — D631 Anemia in chronic kidney disease: Secondary | ICD-10-CM | POA: Diagnosis present

## 2020-08-18 DIAGNOSIS — T796XXA Traumatic ischemia of muscle, initial encounter: Secondary | ICD-10-CM | POA: Diagnosis present

## 2020-08-18 DIAGNOSIS — F5104 Psychophysiologic insomnia: Secondary | ICD-10-CM | POA: Diagnosis present

## 2020-08-18 DIAGNOSIS — Z6841 Body Mass Index (BMI) 40.0 and over, adult: Secondary | ICD-10-CM

## 2020-08-18 DIAGNOSIS — M793 Panniculitis, unspecified: Secondary | ICD-10-CM | POA: Diagnosis present

## 2020-08-18 DIAGNOSIS — N1831 Chronic kidney disease, stage 3a: Secondary | ICD-10-CM | POA: Diagnosis present

## 2020-08-18 DIAGNOSIS — F319 Bipolar disorder, unspecified: Secondary | ICD-10-CM | POA: Diagnosis present

## 2020-08-18 DIAGNOSIS — I959 Hypotension, unspecified: Secondary | ICD-10-CM | POA: Diagnosis present

## 2020-08-18 DIAGNOSIS — T1490XA Injury, unspecified, initial encounter: Secondary | ICD-10-CM

## 2020-08-18 DIAGNOSIS — E538 Deficiency of other specified B group vitamins: Secondary | ICD-10-CM | POA: Diagnosis present

## 2020-08-18 DIAGNOSIS — G2581 Restless legs syndrome: Secondary | ICD-10-CM | POA: Diagnosis present

## 2020-08-18 DIAGNOSIS — Z91030 Bee allergy status: Secondary | ICD-10-CM

## 2020-08-18 DIAGNOSIS — R42 Dizziness and giddiness: Secondary | ICD-10-CM | POA: Diagnosis present

## 2020-08-18 DIAGNOSIS — Z880 Allergy status to penicillin: Secondary | ICD-10-CM

## 2020-08-18 DIAGNOSIS — F411 Generalized anxiety disorder: Secondary | ICD-10-CM | POA: Diagnosis present

## 2020-08-18 DIAGNOSIS — Z91040 Latex allergy status: Secondary | ICD-10-CM

## 2020-08-18 DIAGNOSIS — E872 Acidosis: Secondary | ICD-10-CM | POA: Diagnosis present

## 2020-08-18 DIAGNOSIS — I1310 Hypertensive heart and chronic kidney disease without heart failure, with stage 1 through stage 4 chronic kidney disease, or unspecified chronic kidney disease: Secondary | ICD-10-CM | POA: Diagnosis present

## 2020-08-18 DIAGNOSIS — R000 Tachycardia, unspecified: Secondary | ICD-10-CM | POA: Diagnosis present

## 2020-08-18 DIAGNOSIS — J449 Chronic obstructive pulmonary disease, unspecified: Secondary | ICD-10-CM | POA: Diagnosis present

## 2020-08-18 DIAGNOSIS — Z830 Family history of human immunodeficiency virus [HIV] disease: Secondary | ICD-10-CM

## 2020-08-18 DIAGNOSIS — Z79899 Other long term (current) drug therapy: Secondary | ICD-10-CM

## 2020-08-18 DIAGNOSIS — Z825 Family history of asthma and other chronic lower respiratory diseases: Secondary | ICD-10-CM

## 2020-08-18 DIAGNOSIS — E871 Hypo-osmolality and hyponatremia: Secondary | ICD-10-CM | POA: Diagnosis present

## 2020-08-18 DIAGNOSIS — L89892 Pressure ulcer of other site, stage 2: Secondary | ICD-10-CM | POA: Diagnosis present

## 2020-08-18 DIAGNOSIS — Z87891 Personal history of nicotine dependence: Secondary | ICD-10-CM

## 2020-08-18 DIAGNOSIS — R6510 Systemic inflammatory response syndrome (SIRS) of non-infectious origin without acute organ dysfunction: Secondary | ICD-10-CM

## 2020-08-18 DIAGNOSIS — E860 Dehydration: Secondary | ICD-10-CM | POA: Diagnosis present

## 2020-08-18 DIAGNOSIS — W19XXXA Unspecified fall, initial encounter: Secondary | ICD-10-CM

## 2020-08-18 DIAGNOSIS — N390 Urinary tract infection, site not specified: Secondary | ICD-10-CM | POA: Diagnosis present

## 2020-08-18 DIAGNOSIS — G4700 Insomnia, unspecified: Secondary | ICD-10-CM | POA: Diagnosis present

## 2020-08-18 DIAGNOSIS — G4733 Obstructive sleep apnea (adult) (pediatric): Secondary | ICD-10-CM | POA: Diagnosis present

## 2020-08-18 DIAGNOSIS — Z8371 Family history of colonic polyps: Secondary | ICD-10-CM

## 2020-08-18 DIAGNOSIS — Z91048 Other nonmedicinal substance allergy status: Secondary | ICD-10-CM

## 2020-08-18 DIAGNOSIS — N2889 Other specified disorders of kidney and ureter: Secondary | ICD-10-CM | POA: Diagnosis present

## 2020-08-18 DIAGNOSIS — R5381 Other malaise: Secondary | ICD-10-CM | POA: Diagnosis present

## 2020-08-18 DIAGNOSIS — W1830XA Fall on same level, unspecified, initial encounter: Secondary | ICD-10-CM | POA: Diagnosis present

## 2020-08-18 DIAGNOSIS — Z833 Family history of diabetes mellitus: Secondary | ICD-10-CM

## 2020-08-18 DIAGNOSIS — E662 Morbid (severe) obesity with alveolar hypoventilation: Secondary | ICD-10-CM | POA: Diagnosis present

## 2020-08-18 DIAGNOSIS — I10 Essential (primary) hypertension: Secondary | ICD-10-CM | POA: Diagnosis present

## 2020-08-18 DIAGNOSIS — Z20822 Contact with and (suspected) exposure to covid-19: Secondary | ICD-10-CM | POA: Diagnosis present

## 2020-08-18 DIAGNOSIS — N179 Acute kidney failure, unspecified: Principal | ICD-10-CM | POA: Diagnosis present

## 2020-08-18 DIAGNOSIS — Y92009 Unspecified place in unspecified non-institutional (private) residence as the place of occurrence of the external cause: Secondary | ICD-10-CM

## 2020-08-18 DIAGNOSIS — E1122 Type 2 diabetes mellitus with diabetic chronic kidney disease: Secondary | ICD-10-CM | POA: Diagnosis present

## 2020-08-18 DIAGNOSIS — M79604 Pain in right leg: Secondary | ICD-10-CM | POA: Diagnosis present

## 2020-08-18 DIAGNOSIS — Z818 Family history of other mental and behavioral disorders: Secondary | ICD-10-CM

## 2020-08-18 DIAGNOSIS — R32 Unspecified urinary incontinence: Secondary | ICD-10-CM | POA: Diagnosis present

## 2020-08-18 DIAGNOSIS — R6511 Systemic inflammatory response syndrome (SIRS) of non-infectious origin with acute organ dysfunction: Secondary | ICD-10-CM | POA: Diagnosis present

## 2020-08-18 DIAGNOSIS — B964 Proteus (mirabilis) (morganii) as the cause of diseases classified elsewhere: Secondary | ICD-10-CM | POA: Diagnosis present

## 2020-08-18 DIAGNOSIS — Z8249 Family history of ischemic heart disease and other diseases of the circulatory system: Secondary | ICD-10-CM

## 2020-08-18 DIAGNOSIS — Z881 Allergy status to other antibiotic agents status: Secondary | ICD-10-CM

## 2020-08-18 DIAGNOSIS — N3000 Acute cystitis without hematuria: Secondary | ICD-10-CM

## 2020-08-18 LAB — URINALYSIS, ROUTINE W REFLEX MICROSCOPIC
Bilirubin Urine: NEGATIVE
Glucose, UA: NEGATIVE mg/dL
Ketones, ur: NEGATIVE mg/dL
Nitrite: NEGATIVE
Protein, ur: 100 mg/dL — AB
RBC / HPF: >50 RBC/hpf — ABNORMAL HIGH (ref 0–5)
Specific Gravity, Urine: 1.012 (ref 1.005–1.030)
WBC, UA: >50 WBC/hpf — ABNORMAL HIGH (ref 0–5)
pH: 5 (ref 5.0–8.0)

## 2020-08-18 LAB — CK: Total CK: 46 U/L (ref 38–234)

## 2020-08-18 LAB — COMPREHENSIVE METABOLIC PANEL
ALT: 24 U/L (ref 0–44)
AST: 25 U/L (ref 15–41)
Albumin: 2.3 g/dL — ABNORMAL LOW (ref 3.5–5.0)
Alkaline Phosphatase: 64 U/L (ref 38–126)
Anion gap: 12 (ref 5–15)
BUN: 11 mg/dL (ref 6–20)
CO2: 21 mmol/L — ABNORMAL LOW (ref 22–32)
Calcium: 8.8 mg/dL — ABNORMAL LOW (ref 8.9–10.3)
Chloride: 103 mmol/L (ref 98–111)
Creatinine, Ser: 1.97 mg/dL — ABNORMAL HIGH (ref 0.44–1.00)
GFR, Estimated: 32 mL/min — ABNORMAL LOW (ref >60)
Glucose, Bld: 78 mg/dL (ref 70–99)
Potassium: 3.7 mmol/L (ref 3.5–5.1)
Sodium: 136 mmol/L (ref 135–145)
Total Bilirubin: 0.6 mg/dL (ref 0.3–1.2)
Total Protein: 7 g/dL (ref 6.5–8.1)

## 2020-08-18 LAB — GLUCOSE, CAPILLARY
Glucose-Capillary: 78 mg/dL (ref 70–99)
Glucose-Capillary: 94 mg/dL (ref 70–99)

## 2020-08-18 LAB — CBC WITH DIFFERENTIAL/PLATELET
Abs Immature Granulocytes: 0.13 10*3/uL — ABNORMAL HIGH (ref 0.00–0.07)
Basophils Absolute: 0 10*3/uL (ref 0.0–0.1)
Basophils Relative: 0 %
Eosinophils Absolute: 0.5 10*3/uL (ref 0.0–0.5)
Eosinophils Relative: 4 %
HCT: 33.5 % — ABNORMAL LOW (ref 36.0–46.0)
Hemoglobin: 9.2 g/dL — ABNORMAL LOW (ref 12.0–15.0)
Immature Granulocytes: 1 %
Lymphocytes Relative: 16 %
Lymphs Abs: 1.9 10*3/uL (ref 0.7–4.0)
MCH: 22 pg — ABNORMAL LOW (ref 26.0–34.0)
MCHC: 27.5 g/dL — ABNORMAL LOW (ref 30.0–36.0)
MCV: 80 fL (ref 80.0–100.0)
Monocytes Absolute: 0.6 10*3/uL (ref 0.1–1.0)
Monocytes Relative: 5 %
Neutro Abs: 9.2 10*3/uL — ABNORMAL HIGH (ref 1.7–7.7)
Neutrophils Relative %: 74 %
Platelets: 363 10*3/uL (ref 150–400)
RBC: 4.19 MIL/uL (ref 3.87–5.11)
RDW: 23.9 % — ABNORMAL HIGH (ref 11.5–15.5)
WBC: 12.4 10*3/uL — ABNORMAL HIGH (ref 4.0–10.5)
nRBC: 0 % (ref 0.0–0.2)

## 2020-08-18 LAB — I-STAT CHEM 8, ED
BUN: 12 mg/dL (ref 6–20)
Calcium, Ion: 1.18 mmol/L (ref 1.15–1.40)
Chloride: 104 mmol/L (ref 98–111)
Creatinine, Ser: 2 mg/dL — ABNORMAL HIGH (ref 0.44–1.00)
Glucose, Bld: 72 mg/dL (ref 70–99)
HCT: 33 % — ABNORMAL LOW (ref 36.0–46.0)
Hemoglobin: 11.2 g/dL — ABNORMAL LOW (ref 12.0–15.0)
Potassium: 3.7 mmol/L (ref 3.5–5.1)
Sodium: 140 mmol/L (ref 135–145)
TCO2: 25 mmol/L (ref 22–32)

## 2020-08-18 LAB — APTT: aPTT: 22 seconds — ABNORMAL LOW (ref 24–36)

## 2020-08-18 LAB — I-STAT BETA HCG BLOOD, ED (MC, WL, AP ONLY): I-stat hCG, quantitative: <5 m[IU]/mL (ref <5)

## 2020-08-18 LAB — PROTIME-INR
INR: 1.1 (ref 0.8–1.2)
Prothrombin Time: 14.2 seconds (ref 11.4–15.2)

## 2020-08-18 LAB — LACTIC ACID, PLASMA: Lactic Acid, Venous: 2.6 mmol/L (ref 0.5–1.9)

## 2020-08-18 LAB — RESP PANEL BY RT-PCR (FLU A&B, COVID) ARPGX2
Influenza A by PCR: NEGATIVE
Influenza B by PCR: NEGATIVE
SARS Coronavirus 2 by RT PCR: NEGATIVE

## 2020-08-18 MED ORDER — BUPROPION HCL ER (XL) 150 MG PO TB24
300.0000 mg | ORAL_TABLET | Freq: Every day | ORAL | Status: DC
  Administered 2020-08-19 – 2020-08-24 (×6): 300 mg via ORAL
  Filled 2020-08-18 (×2): qty 2
  Filled 2020-08-18 (×3): qty 1
  Filled 2020-08-18: qty 2
  Filled 2020-08-18: qty 1

## 2020-08-18 MED ORDER — SILVER SULFADIAZINE 1 % EX CREA
TOPICAL_CREAM | Freq: Every day | CUTANEOUS | Status: DC
  Filled 2020-08-18: qty 85

## 2020-08-18 MED ORDER — FLUTICASONE PROPIONATE 50 MCG/ACT NA SUSP
1.0000 | Freq: Every day | NASAL | Status: DC
  Administered 2020-08-19 – 2020-08-24 (×6): 1 via NASAL
  Filled 2020-08-18: qty 16

## 2020-08-18 MED ORDER — ROPINIROLE HCL 0.5 MG PO TABS
3.0000 mg | ORAL_TABLET | Freq: Every day | ORAL | Status: DC
  Administered 2020-08-19 – 2020-08-23 (×6): 3 mg via ORAL
  Filled 2020-08-18: qty 3
  Filled 2020-08-18 (×2): qty 6
  Filled 2020-08-18: qty 3
  Filled 2020-08-18: qty 6
  Filled 2020-08-18 (×3): qty 3

## 2020-08-18 MED ORDER — BUSPIRONE HCL 10 MG PO TABS: 20.0000 mg | ORAL_TABLET | Freq: Two times a day (BID) | ORAL | 2 refills | Status: DC

## 2020-08-18 MED ORDER — ACETAMINOPHEN 325 MG PO TABS
650.0000 mg | ORAL_TABLET | Freq: Four times a day (QID) | ORAL | Status: DC | PRN
  Administered 2020-08-21 – 2020-08-22 (×2): 650 mg via ORAL
  Filled 2020-08-18 (×3): qty 2

## 2020-08-18 MED ORDER — ALBUTEROL SULFATE HFA 108 (90 BASE) MCG/ACT IN AERS: 2.0000 | INHALATION_SPRAY | RESPIRATORY_TRACT | Status: DC | PRN

## 2020-08-18 MED ORDER — DULOXETINE HCL 60 MG PO CPEP
60.0000 mg | ORAL_CAPSULE | Freq: Every day | ORAL | Status: DC
  Administered 2020-08-19 – 2020-08-24 (×6): 60 mg via ORAL
  Filled 2020-08-18 (×7): qty 1

## 2020-08-18 MED ORDER — ENOXAPARIN SODIUM 40 MG/0.4ML IJ SOSY
40.0000 mg | PREFILLED_SYRINGE | INTRAMUSCULAR | Status: DC
  Administered 2020-08-18: 40 mg via SUBCUTANEOUS
  Filled 2020-08-18: qty 0.4

## 2020-08-18 MED ORDER — LACTATED RINGERS IV SOLN: INTRAVENOUS | Status: DC

## 2020-08-18 MED ORDER — BUSPIRONE HCL 10 MG PO TABS
10.0000 mg | ORAL_TABLET | Freq: Two times a day (BID) | ORAL | Status: DC
  Administered 2020-08-18 – 2020-08-24 (×12): 10 mg via ORAL
  Filled 2020-08-18 (×12): qty 1

## 2020-08-18 MED ORDER — VITAMIN B-12 1000 MCG PO TABS
5000.0000 ug | ORAL_TABLET | Freq: Every day | ORAL | Status: DC
  Administered 2020-08-19 – 2020-08-24 (×6): 5000 ug via ORAL
  Filled 2020-08-18 (×7): qty 5

## 2020-08-18 MED ORDER — SODIUM CHLORIDE 0.9 % IV SOLN
1.0000 g | INTRAVENOUS | Status: DC
  Administered 2020-08-18 – 2020-08-19 (×2): 1 g via INTRAVENOUS
  Filled 2020-08-18: qty 10
  Filled 2020-08-18: qty 1
  Filled 2020-08-18: qty 10

## 2020-08-18 MED ORDER — ACETAMINOPHEN 650 MG RE SUPP: 650.0000 mg | Freq: Four times a day (QID) | RECTAL | Status: DC | PRN

## 2020-08-18 MED ORDER — SILVER SULFADIAZINE 1 % EX CREA: TOPICAL_CREAM | CUTANEOUS | 1 refills | Status: DC

## 2020-08-18 MED ORDER — QUETIAPINE FUMARATE 25 MG PO TABS
25.0000 mg | ORAL_TABLET | Freq: Every day | ORAL | Status: DC
  Administered 2020-08-18 – 2020-08-23 (×6): 25 mg via ORAL
  Filled 2020-08-18 (×7): qty 1

## 2020-08-18 MED ORDER — SODIUM CHLORIDE 0.9 % IV BOLUS (SEPSIS)
1000.0000 mL | Freq: Once | INTRAVENOUS | Status: AC
  Administered 2020-08-18: 1000 mL via INTRAVENOUS

## 2020-08-18 MED ORDER — SODIUM CHLORIDE 0.9 % IV BOLUS (SEPSIS)
600.0000 mL | Freq: Once | INTRAVENOUS | Status: AC
  Administered 2020-08-18: 600 mL via INTRAVENOUS

## 2020-08-18 MED ORDER — MONTELUKAST SODIUM 10 MG PO TABS: 10.0000 mg | ORAL_TABLET | Freq: Every day | ORAL | 5 refills | Status: DC

## 2020-08-18 MED ORDER — MONTELUKAST SODIUM 10 MG PO TABS
10.0000 mg | ORAL_TABLET | Freq: Every day | ORAL | Status: DC
  Administered 2020-08-18 – 2020-08-23 (×6): 10 mg via ORAL
  Filled 2020-08-18 (×7): qty 1

## 2020-08-18 NOTE — Discharge Summary (Signed)
Physician Discharge Summary   Heather [name] [id number] DOB: Apr 30, 1975 DOA: 08/09/2020  PCP: Lucianne Lei, MD  Admit date: 08/09/2020 Discharge date:  08/18/2020  Admitted From: home Disposition:  home Discharging physician: Dwyane Dee, MD  Recommendations for Outpatient Follow-up:  1. Continue wound care with home health 2. Follow up with urology regarding right renal mass  Home Health: services arranged at d/c Equipment/Devices: BSC, Walker  Patient discharged to home in Discharge Condition: stable Risk of unplanned readmission score: Unplanned Admission- Pilot do not use: 11.76  CODE STATUS: Full Diet recommendation:  Diet Orders (From admission, onward)    Start     Ordered   08/18/20 0000  Diet - low sodium heart healthy        08/18/20 1018   08/10/20 0838  Diet Heart Room service appropriate? Yes; Fluid consistency: Thin  Diet effective now       Question Answer Comment  Room service appropriate? Yes   Fluid consistency: Thin      08/10/20 [id number]          Hospital Course:  Heather Labarr Closeis a 45 y.o.femalewith medical history significant for morbid obesity, obstructive sleep apnea, chronic anemia, COPD, depression, anxiety, type II DM, ambulatory dysfunction, restlessness syndrome, bipolar disorder, right renal mass. She was brought to the hospital because of a fall at home. Reportedly, her mom had called EMS because patient fell on the floor and had been unable to get up for 48 hours. She complained of painful lesions on her lower abdomen/pannus.  She was admitted to the hospital for acute kidney injury, traumatic rhabdomyolysis, panniculitis and UTI. She was treated with IV fluids and empiric antibiotics. Urine culture revealed Proteus mirabilis. She was evaluated by PT and OT who recommended discharge to SNF. Patient declined and she elected to discharge home at time of discharge.   Panniculitis  Proteus mirabilis UTI: -Completed Keflex and  oral Zyvox on 5/11 - Per WOC: "Continue antimicrobial wicking fabric for coverage of yeast and moisture management. Add silvadene for topical care to open lesions. Make sure that she is bathed and all products removed completely, allowed to dry and reapplied."   Acute kidney injury - Creatinine improved with IVF  Body mass index is 92.55 kg/m.(Morbid obesity)  Hypokalemia: Replete potassium and monitor levels  Traumatic rhabdomyolysis: Resolved with IVF  8.4 cm right renal mass, bilateral nonobstructing nephrolithiasis:  - MRI with and without contrast will be ordered for further evaluation when kidney function improves.Consulted Dr. Gloriann Loan, urologist, for further evaluation of renal mass. He recommended outpatient follow-up. Chart review shows that patient had MRI of the lumbar spine in July 2020 and a large cystic mass measuring 6.8 cm was found in the right kidney. -Patient says she was scheduled to see a urologist outpatient; appt will need to be re-scheduled  S/p fall at home:  -Patient able to ambulate today with PT - She is requesting CIR evaluation  Chronic anemia Iron deficiency anemia - monitor H&H. No indication for blood transfusion at this time.  Other comorbidities include hypertension, anxiety, depression, restless leg syndrome    Principal Diagnosis: Acute renal failure [facility])  Discharge Diagnoses: Active Hospital Problems   Diagnosis Date Noted  . Acute renal failure ([place]) 08/10/2020  . Pressure injury of skin 08/11/2020  . Rhabdomyolysis 08/10/2020  . OSA (obstructive sleep apnea) 11/07/2015  . DM type 2 (diabetes mellitus, type 2) ([place]) 12/07/2014  . Cellulitis, abdominal wall 11/11/2014  . Leukocytosis 07/28/2008  . Morbid obesity (  [place]) 05/13/2006  . Essential hypertension 05/12/2006  . GERD (gastroesophageal reflux disease) 05/12/2006    Resolved Hospital Problems  No resolved problems to display.    Discharge Instructions    Diet -  low sodium heart healthy   Complete by: As directed    Discharge wound care:   Complete by: As directed    Apply wicking pads to abdominal skin folds daily. Use nystatin powder as needed up to three times daily.   Increase activity slowly   Complete by: As directed      Allergies as of 08/18/2020      Reactions   Bee Venom Shortness Of Breath   Penicillins Anaphylaxis   Has patient had a PCN reaction causing immediate rash, facial/tongue/throat swelling, SOB or lightheadedness with hypotension: No Has patient had a PCN reaction causing severe rash involving mucus membranes or skin necrosis: No Has patient had a PCN reaction that required hospitalization No Has patient had a PCN reaction occurring within the last 10 years: No If all of the above answers are "NO", then may proceed with Cephalosporin use.' REACTION: Angioedema   Penicillin G    Adhesive [tape] Rash   Latex Rash   Vancomycin Other (See Comments)   Pt can tolerate Vancomycin but did cause Red-Man Syndrome.  Recommend to pre-medicate with Benadryl before doses administered.        Medication List    STOP taking these medications   fluconazole 150 MG tablet Commonly known as: DIFLUCAN   fluticasone 50 MCG/ACT nasal spray Commonly known as: FLONASE   loratadine 10 MG tablet Commonly known as: CLARITIN   sulfamethoxazole-trimethoprim 800-160 MG tablet Commonly known as: Bactrim DS     TAKE these medications   acetaminophen 325 MG tablet Commonly known as: TYLENOL Take 650 mg by mouth every 6 (six) hours as needed for mild pain, fever or headache.   albuterol 108 (90 Base) MCG/ACT inhaler Commonly known as: VENTOLIN HFA INHALE 2 PUFFS INTO THE LUNGS EVERY 4 HOURS AS NEEDED FOR WHEEZING OR SHORTNESS OF BREATH   buPROPion 300 MG 24 hr tablet Commonly known as: WELLBUTRIN XL Take 1 tablet (300 mg total) by mouth daily.   busPIRone 10 MG tablet Commonly known as: BUSPAR Take 2 tablets (20 mg total) by mouth  2 (two) times daily.   CRANBERRY PO Take 1 tablet by mouth daily.   DULoxetine 30 MG capsule Commonly known as: Cymbalta Take 2 capsules (60 mg total) by mouth daily.   Flovent HFA 110 MCG/ACT inhaler Generic drug: fluticasone Inhale 2 puffs into the lungs 2 (two) times daily.   meloxicam 15 MG tablet Commonly known as: MOBIC Take 15 mg by mouth daily.   montelukast 10 MG tablet Commonly known as: SINGULAIR Take 1 tablet (10 mg total) by mouth at bedtime. What changed: See the new instructions.   MSM 1000 MG Caps Take 1,000 mg by mouth daily.   nystatin powder Commonly known as: MYCOSTATIN/NYSTOP Apply 1 application topically 3 (three) times daily. What changed:   when to take this  reasons to take this  Another medication with the same name was removed. Continue taking this medication, and follow the directions you see here.   QUEtiapine 25 MG tablet Commonly known as: SEROQUEL Take 1 tablet (25 mg total) by mouth at bedtime.   rOPINIRole 4 MG tablet Commonly known as: REQUIP Take 1 tablet (4 mg total) by mouth at bedtime.   sodium chloride 0.65 % Soln nasal spray Commonly  known as: OCEAN Place 1-2 sprays into both nostrils as needed for congestion.   Turmeric 500 MG Caps Take 500 mg by mouth daily.   Vitamin B-12 5000 MCG Tbdp Take 5,000 mcg by mouth daily.   Vitamin D3 125 MCG (5000 UT) Caps Take 10,000 Units by mouth daily.   vitamin E 180 MG (400 UNITS) capsule Take 400 Units by mouth daily.            Durable Medical Equipment  (From admission, onward)         Start     Ordered   08/17/20 1627  For home use only DME 3 n 1  Once       Comments: Bariatric version   08/17/20 1626   08/17/20 1609  For home use only DME Walker wide  Once       Question:  Patient needs a walker to treat with the following condition  Answer:  Generalized muscle weakness   08/17/20 1609           Discharge Care Instructions  (From admission, onward)          Start     Ordered   08/18/20 0000  Discharge wound care:       Comments: Apply wicking pads to abdominal skin folds daily. Use nystatin powder as needed up to three times daily.   08/18/20 1018          Allergies  Allergen Reactions  . Bee Venom Shortness Of Breath  . Penicillins Anaphylaxis    Has patient had a PCN reaction causing immediate rash, facial/tongue/throat swelling, SOB or lightheadedness with hypotension: No Has patient had a PCN reaction causing severe rash involving mucus membranes or skin necrosis: No Has patient had a PCN reaction that required hospitalization No Has patient had a PCN reaction occurring within the last 10 years: No If all of the above answers are "NO", then may proceed with Cephalosporin use.'  REACTION: Angioedema  . Penicillin G   . Adhesive [Tape] Rash  . Latex Rash  . Vancomycin Other (See Comments)    Pt can tolerate Vancomycin but did cause Red-Man Syndrome.  Recommend to pre-medicate with Benadryl before doses administered.      Consultations:   Discharge Exam: BP 136/65 (BP Location: Right Arm)   Pulse 88   Temp 97.9 F (36.6 C) (Oral)   Resp 18   Ht 5\' 3"  (1.6 m)   Wt (!) 236.3 kg   SpO2 95%   BMI 92.29 kg/m  General appearance: alert, cooperative and no distress Head: Normocephalic, without obvious abnormality, atraumatic Eyes: EOMI Lungs: clear to auscultation bilaterally Heart: regular rate and rhythm and S1, S2 normal Abdomen: obese, skin excoriations noted in lower abdominal folds.  No significant erythema. No TTP Extremities: Obese Skin: mobility and turgor normal Neurologic: No focal deficits  The results of significant diagnostics from this hospitalization (including imaging, microbiology, ancillary and laboratory) are listed below for reference.   Microbiology: Recent Results (from the past 240 hour(s))  Blood culture (routine x 2)     Status: None   Collection Time: 08/10/20  5:20 AM   Specimen:  BLOOD LEFT HAND  Result Value Ref Range Status   Specimen Description   Final    BLOOD LEFT HAND Performed at [facility], [place] [address] [place], [place] [address]    Special Requests   Final    BOTTLES DRAWN AEROBIC ONLY Blood Culture results may not be  optimal due to an inadequate volume of blood received in culture bottles Performed at [facility] [address], [place], [place] [address]    Culture   Final    NO GROWTH 5 DAYS Performed at [facility], [place] [address], [place], [place] [address]    Report Status 08/15/2020 FINAL  Final  Resp Panel by RT-PCR (Flu A&B, Covid) Nasopharyngeal Swab     Status: None   Collection Time: 08/10/20  7:42 AM   Specimen: Nasopharyngeal Swab; Nasopharyngeal(NP) swabs in vial transport medium  Result Value Ref Range Status   SARS Coronavirus 2 by RT PCR NEGATIVE NEGATIVE Final    Comment: (NOTE) SARS-CoV-2 target nucleic acids are NOT DETECTED.  The SARS-CoV-2 RNA is generally detectable in upper respiratory specimens during the acute phase of infection. The lowest concentration of SARS-CoV-2 viral copies this assay can detect is 138 copies/mL. A negative result does not preclude SARS-Cov-2 infection and should not be used as the sole basis for treatment or other patient management decisions. A negative result may occur with  improper specimen collection/handling, submission of specimen other than nasopharyngeal swab, presence of viral mutation(s) within the areas targeted by this assay, and inadequate number of viral copies(<138 copies/mL). A negative result must be combined with clinical observations, patient history, and epidemiological information. The expected result is Negative.  Fact Sheet for Patients:  EntrepreneurPulse.com.au  Fact Sheet for Healthcare Providers:  IncredibleEmployment.be  This test is no t yet approved or cleared by the [place] FDA and  has been authorized for detection and/or diagnosis of SARS-CoV-2 by FDA under an Emergency Use Authorization (EUA). This EUA will remain  in effect (meaning this test can be used) for the duration of the COVID-19 declaration under Section 564(b)(1) of the Act, 21 U.S.C.section 360bbb-3(b)(1), unless the authorization is terminated  or revoked sooner.       Influenza A by PCR NEGATIVE NEGATIVE Final   Influenza B by PCR NEGATIVE NEGATIVE Final    Comment: (NOTE) The Xpert Xpress SARS-CoV-2/FLU/RSV plus assay is intended as an aid in the diagnosis of influenza from Nasopharyngeal swab specimens and should not be used as a sole basis for treatment. Nasal washings and aspirates are unacceptable for Xpert Xpress SARS-CoV-2/FLU/RSV testing.  Fact Sheet for Patients: EntrepreneurPulse.com.au  Fact Sheet for Healthcare Providers: IncredibleEmployment.be  This test is not yet approved or cleared by the [place] FDA and has been authorized for detection and/or diagnosis of SARS-CoV-2 by FDA under an Emergency Use Authorization (EUA). This EUA will remain in effect (meaning this test can be used) for the duration of the COVID-19 declaration under Section 564(b)(1) of the Act, 21 U.S.C. section 360bbb-3(b)(1), unless the authorization is terminated or revoked.  Performed at [facility], [place] [address], [place], [place] [address]   Culture, Urine     Status: Abnormal   Collection Time: 08/11/20  9:37 AM   Specimen: Urine, Catheterized  Result Value Ref Range Status   Specimen Description   Final    URINE, CATHETERIZED Performed at [facility] [address], [place], [place] [address]    Special Requests   Final    Normal Performed at [facility], [place] [address], [place], [place] [address]    Culture >=100,000 COLONIES/mL PROTEUS MIRABILIS (A)  Final   Report Status  08/13/2020 FINAL  Final   Organism ID, Bacteria PROTEUS MIRABILIS (A)  Final  Susceptibility   Proteus mirabilis - MIC*    AMPICILLIN <=2 SENSITIVE Sensitive     CEFAZOLIN <=4 SENSITIVE Sensitive     CEFEPIME <=0.12 SENSITIVE Sensitive     CEFTRIAXONE <=0.25 SENSITIVE Sensitive     CIPROFLOXACIN <=0.25 SENSITIVE Sensitive     GENTAMICIN 4 SENSITIVE Sensitive     IMIPENEM 2 SENSITIVE Sensitive     NITROFURANTOIN RESISTANT Resistant     TRIMETH/SULFA <=20 SENSITIVE Sensitive     AMPICILLIN/SULBACTAM <=2 SENSITIVE Sensitive     PIP/TAZO <=4 SENSITIVE Sensitive     * >=100,000 COLONIES/mL PROTEUS MIRABILIS  Aerobic Culture w Gram Stain (superficial specimen)     Status: None   Collection Time: 08/12/20 12:46 PM   Specimen: Wound  Result Value Ref Range Status   Specimen Description WOUND  Final   Special Requests NONE  Final   Gram Stain   Final    RARE WBC PRESENT, PREDOMINANTLY MONONUCLEAR NO ORGANISMS SEEN    Culture RARE ESCHERICHIA COLI FEW ENTEROCOCCUS FAECALIS   Final   Report Status 08/16/2020 FINAL  Final   Organism ID, Bacteria ESCHERICHIA COLI  Final   Organism ID, Bacteria ENTEROCOCCUS FAECALIS  Final      Susceptibility   Escherichia coli - MIC*    AMPICILLIN <=2 SENSITIVE Sensitive     CEFAZOLIN <=4 SENSITIVE Sensitive     CEFEPIME <=0.12 SENSITIVE Sensitive     CEFTAZIDIME <=1 SENSITIVE Sensitive     CEFTRIAXONE <=0.25 SENSITIVE Sensitive     CIPROFLOXACIN <=0.25 SENSITIVE Sensitive     GENTAMICIN <=1 SENSITIVE Sensitive     IMIPENEM <=0.25 SENSITIVE Sensitive     TRIMETH/SULFA <=20 SENSITIVE Sensitive     AMPICILLIN/SULBACTAM <=2 SENSITIVE Sensitive     PIP/TAZO <=4 SENSITIVE Sensitive     * RARE ESCHERICHIA COLI   Enterococcus faecalis - MIC*    AMPICILLIN <=2 SENSITIVE Sensitive     LEVOFLOXACIN 1 SENSITIVE Sensitive     VANCOMYCIN 1 SENSITIVE Sensitive     GENTAMICIN SYNERGY Value in next row Sensitive      SENSITIVEPerformed at [facility], [address], [place], [place] [address]    * FEW ENTEROCOCCUS FAECALIS     Labs: BNP (last 3 results) No results for input(s): BNP in the last 8760 hours. Basic Metabolic Panel: Recent Labs  Lab 08/12/20 0409 08/13/20 0732 08/14/20 0331 08/15/20 0400 08/16/20 0407  NA 149* 145 142 140 141  K 3.9 3.6 3.4* 3.4* 4.5  CL 116* 116* 112* 112* 113*  CO2 22 21* 24 24 21*  GLUCOSE 81 76 89 82 77  BUN 33* 26* 23* 17 14  CREATININE 2.85* 2.27* 2.42* 2.14* 1.66*  CALCIUM 9.0 8.2* 8.2* 8.2* 8.4*  MG 2.2 1.8  --  2.0 1.9  PHOS 3.3 2.4*  --   --   --    Liver Function Tests: Recent Labs  Lab 08/12/20 0409  AST 90*  ALT 54*  ALKPHOS 92  BILITOT 0.2*  PROT 7.8  ALBUMIN 2.4*   No results for input(s): LIPASE, AMYLASE in the last 168 hours. No results for input(s): AMMONIA in the last 168 hours. CBC: Recent Labs  Lab 08/12/20 0409 08/13/20 0732 08/13/20 1445 08/14/20 1258  WBC 10.8* 9.6 9.4 7.2  NEUTROABS  --   --   --  4.0  HGB 8.2* 7.2* 7.8* 7.4*  HCT 30.7* 27.1* 28.9* 28.2*  MCV 80.2 80.9 81.4 81.7  PLT 549* 430* 435* 401*  Cardiac Enzymes: Recent Labs  Lab 08/12/20 0409 08/13/20 0732  CKTOTAL 946* 377*   BNP: Invalid input(s): POCBNP CBG: Recent Labs  Lab 08/17/20 1139 08/17/20 1706 08/17/20 2102 08/18/20 0805 08/18/20 1159  GLUCAP 90 104* 90 78 94   D-Dimer No results for input(s): DDIMER in the last 72 hours. Hgb A1c No results for input(s): HGBA1C in the last 72 hours. Lipid Profile No results for input(s): CHOL, HDL, LDLCALC, TRIG, CHOLHDL, LDLDIRECT in the last 72 hours. Thyroid function studies No results for input(s): TSH, T4TOTAL, T3FREE, THYROIDAB in the last 72 hours.  Invalid input(s): FREET3 Anemia work up No results for input(s): VITAMINB12, FOLATE, FERRITIN, TIBC, IRON, RETICCTPCT in the last 72 hours. Urinalysis    Component Value Date/Time   COLORURINE YELLOW 08/11/2020 0937   APPEARANCEUR CLOUDY (A) 08/11/2020 0937    APPEARANCEUR Cloudy (A) 08/06/2016 1209   LABSPEC 1.013 08/11/2020 0937   PHURINE 7.0 08/11/2020 0937   GLUCOSEU NEGATIVE 08/11/2020 0937   HGBUR MODERATE (A) 08/11/2020 0937   HGBUR large 04/19/2008 0952   BILIRUBINUR NEGATIVE 08/11/2020 0937   BILIRUBINUR Negative 08/06/2016 [place] 08/11/2020 0937   PROTEINUR 100 (A) 08/11/2020 0937   UROBILINOGEN 0.2 12/06/2014 2049   NITRITE POSITIVE (A) 08/11/2020 0937   LEUKOCYTESUR LARGE (A) 08/11/2020 0937   Sepsis Labs Invalid input(s): PROCALCITONIN,  WBC,  LACTICIDVEN Microbiology Recent Results (from the past 240 hour(s))  Blood culture (routine x 2)     Status: None   Collection Time: 08/10/20  5:20 AM   Specimen: BLOOD LEFT HAND  Result Value Ref Range Status   Specimen Description   Final    BLOOD LEFT HAND Performed at [facility], [place] [address] [place], [place] [address]    Special Requests   Final    BOTTLES DRAWN AEROBIC ONLY Blood Culture results may not be optimal due to an inadequate volume of blood received in culture bottles Performed at [facility] [address], [place], [place] [address]    Culture   Final    NO GROWTH 5 DAYS Performed at [facility], [place] [address] [place], [place] [address]    Report Status 08/15/2020 FINAL  Final  Resp Panel by RT-PCR (Flu A&B, Covid) Nasopharyngeal Swab     Status: None   Collection Time: 08/10/20  7:42 AM   Specimen: Nasopharyngeal Swab; Nasopharyngeal(NP) swabs in vial transport medium  Result Value Ref Range Status   SARS Coronavirus 2 by RT PCR NEGATIVE NEGATIVE Final    Comment: (NOTE) SARS-CoV-2 target nucleic acids are NOT DETECTED.  The SARS-CoV-2 RNA is generally detectable in upper respiratory specimens during the acute phase of infection. The lowest concentration of SARS-CoV-2 viral copies this assay can detect is 138 copies/mL. A negative result does not preclude SARS-Cov-2 infection and  should not be used as the sole basis for treatment or other patient management decisions. A negative result may occur with  improper specimen collection/handling, submission of specimen other than nasopharyngeal swab, presence of viral mutation(s) within the areas targeted by this assay, and inadequate number of viral copies(<138 copies/mL). A negative result must be combined with clinical observations, patient history, and epidemiological information. The expected result is Negative.  Fact Sheet for Patients:  EntrepreneurPulse.com.au  Fact Sheet for Healthcare Providers:  IncredibleEmployment.be  This test is no t yet approved or cleared by the [place] FDA and  has been authorized for detection and/or diagnosis of SARS-CoV-2  by FDA under an Emergency Use Authorization (EUA). This EUA will remain  in effect (meaning this test can be used) for the duration of the COVID-19 declaration under Section 564(b)(1) of the Act, 21 U.S.C.section 360bbb-3(b)(1), unless the authorization is terminated  or revoked sooner.       Influenza A by PCR NEGATIVE NEGATIVE Final   Influenza B by PCR NEGATIVE NEGATIVE Final    Comment: (NOTE) The Xpert Xpress SARS-CoV-2/FLU/RSV plus assay is intended as an aid in the diagnosis of influenza from Nasopharyngeal swab specimens and should not be used as a sole basis for treatment. Nasal washings and aspirates are unacceptable for Xpert Xpress SARS-CoV-2/FLU/RSV testing.  Fact Sheet for Patients: EntrepreneurPulse.com.au  Fact Sheet for Healthcare Providers: IncredibleEmployment.be  This test is not yet approved or cleared by the [place] FDA and has been authorized for detection and/or diagnosis of SARS-CoV-2 by FDA under an Emergency Use Authorization (EUA). This EUA will remain in effect (meaning this test can be used) for the duration of the COVID-19 declaration  under Section 564(b)(1) of the Act, 21 U.S.C. section 360bbb-3(b)(1), unless the authorization is terminated or revoked.  Performed at [facility], [place] [address] [place], [place] [address]   Culture, Urine     Status: Abnormal   Collection Time: 08/11/20  9:37 AM   Specimen: Urine, Catheterized  Result Value Ref Range Status   Specimen Description   Final    URINE, CATHETERIZED Performed at [facility] [address], [place], [place] [address]    Special Requests   Final    Normal Performed at [facility], [place] [address] [place], [place] [address]    Culture >=100,000 COLONIES/mL PROTEUS MIRABILIS (A)  Final   Report Status 08/13/2020 FINAL  Final   Organism ID, Bacteria PROTEUS MIRABILIS (A)  Final      Susceptibility   Proteus mirabilis - MIC*    AMPICILLIN <=2 SENSITIVE Sensitive     CEFAZOLIN <=4 SENSITIVE Sensitive     CEFEPIME <=0.12 SENSITIVE Sensitive     CEFTRIAXONE <=0.25 SENSITIVE Sensitive     CIPROFLOXACIN <=0.25 SENSITIVE Sensitive     GENTAMICIN 4 SENSITIVE Sensitive     IMIPENEM 2 SENSITIVE Sensitive     NITROFURANTOIN RESISTANT Resistant     TRIMETH/SULFA <=20 SENSITIVE Sensitive     AMPICILLIN/SULBACTAM <=2 SENSITIVE Sensitive     PIP/TAZO <=4 SENSITIVE Sensitive     * >=100,000 COLONIES/mL PROTEUS MIRABILIS  Aerobic Culture w Gram Stain (superficial specimen)     Status: None   Collection Time: 08/12/20 12:46 PM   Specimen: Wound  Result Value Ref Range Status   Specimen Description WOUND  Final   Special Requests NONE  Final   Gram Stain   Final    RARE WBC PRESENT, PREDOMINANTLY MONONUCLEAR NO ORGANISMS SEEN    Culture RARE ESCHERICHIA COLI FEW ENTEROCOCCUS FAECALIS   Final   Report Status 08/16/2020 FINAL  Final   Organism ID, Bacteria ESCHERICHIA COLI  Final   Organism ID, Bacteria ENTEROCOCCUS FAECALIS  Final      Susceptibility   Escherichia coli - MIC*    AMPICILLIN <=2  SENSITIVE Sensitive     CEFAZOLIN <=4 SENSITIVE Sensitive     CEFEPIME <=0.12 SENSITIVE Sensitive     CEFTAZIDIME <=1 SENSITIVE Sensitive     CEFTRIAXONE <=0.25 SENSITIVE Sensitive     CIPROFLOXACIN <=0.25 SENSITIVE Sensitive     GENTAMICIN <=1 SENSITIVE Sensitive  IMIPENEM <=0.25 SENSITIVE Sensitive     TRIMETH/SULFA <=20 SENSITIVE Sensitive     AMPICILLIN/SULBACTAM <=2 SENSITIVE Sensitive     PIP/TAZO <=4 SENSITIVE Sensitive     * RARE ESCHERICHIA COLI   Enterococcus faecalis - MIC*    AMPICILLIN <=2 SENSITIVE Sensitive     LEVOFLOXACIN 1 SENSITIVE Sensitive     VANCOMYCIN 1 SENSITIVE Sensitive     GENTAMICIN SYNERGY Value in next row Sensitive      SENSITIVEPerformed at [facility] [address], [place], [place] [address]    * FEW ENTEROCOCCUS FAECALIS    Procedures/Studies: DG Lumbar Spine 2-3 Views  Result Date: 08/10/2020 CLINICAL DATA:  Fall from bed 08/08/2020, late on floor and fell tonight, states hips hurt, completely immobile and weighs 400 pounds EXAM: LUMBAR SPINE - 2-3 VIEW COMPARISON:  MR 11/04/2018, radiograph 06/02/2014 FINDINGS: Exam quality is significantly diminished by patient body habitus which results in for contrast resolution. Redemonstrated grade 1 anterolisthesis L5 on S1 with probable pars defects. Posterior wedging of the L5 vertebrae is noted with up to 50% height loss which is similar to most recent lumbar MR imaging. No clear acute fracture or traumatic malalignment. Additional multilevel discogenic and facet degenerative changes throughout the included thoracolumbar levels, maximal L5-S1. IMPRESSION: Suboptimal imaging quality due to body habitus. No clear acute fracture or traumatic listhesis. Chronic grade 1 anterolisthesis L5 on S1 with probable pars defects. Posterior wedging deformity L5 with 50% height loss posteriorly, unchanged from comparison. Electronically Signed   By: Lovena Le M.D.   On: 08/10/2020 01:37   DG Pelvis 1-2  Views  Result Date: 08/10/2020 CLINICAL DATA:  Fall from bed 08/08/2020, immobile since fall EXAM: PELVIS - 1-2 VIEW COMPARISON:  Contemporary lumbar radiographs. FINDINGS: Suboptimal imaging quality due to patient body habitus. No clear acute fracture or traumatic osseous injury of the bony pelvis or proximal femora. Mild degenerative changes in the hips and pelvis. For findings in the lumbar spine, please see dedicated radiographs. IMPRESSION: Suboptimal imaging quality due to body habitus. No clear acute fracture or traumatic malalignment. Degenerative changes in the hips and pelvis. Dedicated lumbar spine imaging performed concurrently. Electronically Signed   By: Lovena Le M.D.   On: 08/10/2020 01:40   US RENAL  Result Date: 08/14/2020 CLINICAL DATA:  Acute kidney injury. EXAM: RENAL / URINARY TRACT ULTRASOUND COMPLETE COMPARISON:  Abdomen ultrasound, 63016. FINDINGS: Right Kidney: Renal measurements: 18.1 x 6.2 x 6.8 cm = volume: 402 mL. Normal parenchymal echogenicity. Oval mass arises from the upper pole enlarging the overall length of the kidney. Mass measures 8.4 x 7.3 x 7.7 cm. Mass has internal echoes, but a thin wall and increased through transmission of the sound beam with no definitive internal blood flow on color Doppler. There are shadowing collecting system stones, largest measuring 1.2 cm. No hydronephrosis. Left Kidney: Renal measurements: 13.4 x 6.6 x 5.2 cm = volume: 238 mL. Normal parenchymal echogenicity. Shadowing stones, largest measuring 1.9 cm. No masses. No hydronephrosis. Bladder: Decompressed with a Foley catheter. Other: None. IMPRESSION: 1. 8.4 cm mass arises from the upper pole of the right kidney. Although this may reflect a cyst, it is not fully defined/characterized and could be a solid mass. Recommend follow-up renal MRI without and with contrast. This mass was not evident on the previous ultrasound. 2. Bilateral nonobstructing intrarenal stones. 3. No hydronephrosis.  No  acute finding. Electronically Signed   By: Lajean Manes M.D.   On: 08/14/2020 08:19  DG Chest Portable 1 View  Result Date: 08/10/2020 CLINICAL DATA:  Shortness of breath EXAM: PORTABLE CHEST 1 VIEW COMPARISON:  06/22/2018 FINDINGS: Cardiomegaly accentuated by low lung volumes. Low volume chest with indistinct perihilar interstitial markings. No Kerley lines, effusion, or pneumothorax. IMPRESSION: Limited low volume chest. Possible vascular congestion.  No edema or focal airspace disease. Electronically Signed   By: Monte Fantasia M.D.   On: 08/10/2020 06:20   DG Knee Left Port  Result Date: 08/10/2020 CLINICAL DATA:  Fall. EXAM: PORTABLE LEFT KNEE - 1-2 VIEW COMPARISON:  04/11/2016. FINDINGS: Small bony density noted adjacent to the lateral aspect of the patella. This may represent a patellar fracture fragment, age undetermined. This was not present on prior study of 04/11/2016. Patella is again high-riding. Prominent tricompartment degenerative change again noted. Evaluation for effusion is difficult due to positioning. No prominent knee joint effusion noted. IMPRESSION: 1. Small bony density noted adjacent to the lateral aspect of the patella. This may represent patellar fracture fragment, age undetermined. This was not present on prior study of 04/11/2016. Patella is again high-riding. 2.  Prominent tricompartment degenerative change again noted. Electronically Signed   By: [facility]cello Moores  Register   On: 08/10/2020 06:12   DG Knee Right Port  Result Date: 08/10/2020 CLINICAL DATA:  Fall. EXAM: PORTABLE RIGHT KNEE - 1-2 VIEW COMPARISON:  06/02/2014. FINDINGS: Bipartite patella again noted. High-riding patella again noted. Tricompartment degenerative change, most prominent about the lateral compartment again noted. Interim progression of joint space loss from prior exam. No acute bony or joint abnormality. Evaluation for effusions difficult due to positioning. Small knee joint effusion cannot be excluded.  IMPRESSION: 1. Bipartite patella again noted. High-riding patella again noted. Tricompartment degenerative change, most prominent about the lateral compartment again noted. Interim progression of joint space loss from prior exam. 2. No acute bony or joint abnormality. Small knee joint effusion cannot be excluded. Electronically Signed   By: Marcello Moores  Register   On: 08/10/2020 06:21     Time coordinating discharge: Over 70 minutes    Dwyane Dee, MD  [facility] 08/18/2020, 1:10 PM

## 2020-08-18 NOTE — TOC Progression Note (Signed)
Transition of Care [facility]) - Progression Note    Patient Details  Name: Heather [name] MRN: [id number] Date of Birth: [birth year]  [facility]) CM/SW Contact  Purcell Mouton, RN Phone Number: 08/18/2020, 1:25 PM  Clinical Narrative:    T.Rogers, SW  from [place] in for clinicals on pt.    Expected Discharge Plan: [place] Barriers to Discharge: Continued Medical Work up  Expected Discharge Plan and Services Expected Discharge Plan: [place] arrangements for the past 2 months: Apartment Expected Discharge Date: 08/18/20                                     Social Determinants of Health (SDOH) Interventions    Readmission Risk Interventions No flowsheet data found.

## 2020-08-18 NOTE — ED Notes (Signed)
Pt difficult IV stick. IV team able to gain IV access after prolong attempt. Line will not draw and blood cultures as well as other blood work not able to be collected, Will ask resident for arterial stick for bloodwork

## 2020-08-18 NOTE — H&P (Addendum)
History and Physical    Heather [name] [id number] DOB: [birth year] DOA: 08/18/2020  PCP: Lucianne Lei, MD  Patient coming from: [facility].  Chief Complaint: Fall.  HPI: Heather [name] is a 45 y.o. female with history of morbid obesity was discharged home after being admitted for UTI and multiple wounds with/pancolitis at that admission patient was found to have renal failure and rhabdomyolysis.  Patient was treated with antibiotics.  After reaching patient's portal where patient was staying patient felt weak while walking with help of walker and fell onto the floor but did not lose consciousness.  EMS was called patient was found to be hypotensive and tachycardic.  ED Course: In the ER patient's initial blood pressure was 70 systolic afebrile lactic acid was elevated at 2.6.  Patient was started on empiric antibiotics.  UA is concerning for UTI.  Patient has multiple wounds on the pannus and the right posterior thigh.  WBC count is 12.4 CK levels are normal.  Creatinine 1.97 COVID test negative.  Patient had CT scan of the chest abdomen pelvis and CT head which were not showing anything acute.  Patient admitted for hypotension with fall with concerning features for possible developing sepsis.  Review of Systems: As per HPI, rest all negative.   Past Medical History:  Diagnosis Date  . Allergy   . Amenorrhea   . Anemia    post partum   . Anxiety   . Anxiety   . Arthritis   . Asthma   . Back pain   . Constipation   . COPD (chronic obstructive pulmonary disease) ([place])   . Delivery with history of C-section   . Depression   . Depression   . Diabetes mellitus without complication [facility])    patient denies but states she has hyperglycemia-diet controlled  . Dysmenorrhea   . Dysrhythmia    DR Johnsie Cancel    . Ectopic pregnancy 2013  . Edema, lower extremity   . Eosinophilic esophagitis    Diagnosed at [facility] 06/16/2013, untreated  . Gallbladder problem   . GERD  (gastroesophageal reflux disease)    HEARTBURN   TUMS  . Hard to intubate 11/07/2015  . High cholesterol   . IBS (irritable bowel syndrome)   . Leukocytosis 07/28/2008   Qualifier: Diagnosis of  By: Jonna Munro MD, Roderic Scarce    . Morbid obesity ([place])   . Neuromuscular disorder (H[facility])    RESTLESS LEG   . Obesity   . Schizoaffective disorder, bipolar type ([place])   . Sepsis ([place]) 11/11/2014  . Shortness of breath    WITH EXERTION   . Sleep apnea    CPAP- in process of restarting     Past Surgical History:  Procedure Laterality Date  . BIOPSY  08/13/2018   Procedure: BIOPSY;  Surgeon: Yetta Flock, MD;  Location: [facility];  Service: Gastroenterology;;  . CESAREAN SECTION MULTI-GESTATIONAL N/A 02/03/2017   Procedure: CESAREAN SECTION MULTI-GESTATIONAL;  Surgeon: Jonnie Kind, MD;  Location: [place];  Service: Obstetrics;  Laterality: N/A;  . CHOLECYSTECTOMY    . COLONOSCOPY WITH PROPOFOL N/A 08/13/2018   Procedure: COLONOSCOPY WITH PROPOFOL;  Surgeon: Yetta Flock, MD;  Location: [facility];  Service: Gastroenterology;  Laterality: N/A;  . DENTAL SURGERY    . ESOPHAGOGASTRODUODENOSCOPY  May 2007   Dr. Gala Romney: Normal esophagus, stomach, D1, D2  . ESOPHAGOGASTRODUODENOSCOPY  06/16/2013   Dr. Carlton Adam, eosinophilic esophagitis, reactive gastropathy, no esophageal dilation  . ESOPHAGOGASTRODUODENOSCOPY (EGD) WITH PROPOFOL N/A 08/13/2018  Procedure: ESOPHAGOGASTRODUODENOSCOPY (EGD) WITH PROPOFOL;  Surgeon: Yetta Flock, MD;  Location: [facility];  Service: Gastroenterology;  Laterality: N/A;  . POLYPECTOMY  08/13/2018   Procedure: POLYPECTOMY;  Surgeon: Yetta Flock, MD;  Location: [facility];  Service: Gastroenterology;;  . TONSILLECTOMY    . TOOTH EXTRACTION  10/28/2011   Procedure: DENTAL RESTORATION/EXTRACTIONS;  Surgeon: Gae Bon, DDS;  Location: [facility] OR;  Service: Oral Surgery;;  . UPPER GASTROINTESTINAL ENDOSCOPY       reports that she  quit smoking about 9 years ago. Her smoking use included cigarettes. She has a 4.00 pack-year smoking history. She has never used smokeless tobacco. She reports that she does not drink alcohol and does not use drugs.  Allergies  Allergen Reactions  . Bee Venom Shortness Of Breath  . Penicillins Anaphylaxis    Has patient had a PCN reaction causing immediate rash, facial/tongue/throat swelling, SOB or lightheadedness with hypotension: No Has patient had a PCN reaction causing severe rash involving mucus membranes or skin necrosis: No Has patient had a PCN reaction that required hospitalization No Has patient had a PCN reaction occurring within the last 10 years: No If all of the above answers are "NO", then may proceed with Cephalosporin use.'  REACTION: Angioedema  . Penicillin G   . Adhesive [Tape] Rash  . Latex Rash  . Vancomycin Other (See Comments)    Pt can tolerate Vancomycin but did cause Red-Man Syndrome.  Recommend to pre-medicate with Benadryl before doses administered.      Family History  Problem Relation Age of Onset  . Depression Mother   . Anxiety disorder Mother   . High blood pressure Mother   . Bipolar disorder Mother   . Eating disorder Mother   . Obesity Mother   . Hypertension Sister   . Allergic rhinitis Sister   . Colon polyps Maternal Grandmother        46s  . Diabetes Maternal Grandmother   . Anxiety disorder Maternal Grandmother   . COPD Maternal Grandmother   . Crohn's disease Maternal Aunt   . Cancer Maternal Grandfather        prostate  . HIV/AIDS Father   . Eating disorder Father   . Obesity Father   . Liver disease Neg Hx   . Angioedema Neg Hx   . Eczema Neg Hx   . Immunodeficiency Neg Hx   . Asthma Neg Hx   . Urticaria Neg Hx   . Colon cancer Neg Hx   . Esophageal cancer Neg Hx   . Rectal cancer Neg Hx   . Stomach cancer Neg Hx     Prior to Admission medications   Medication Sig Start Date End Date Taking? Authorizing Provider   acetaminophen (TYLENOL) 325 MG tablet Take 650 mg by mouth every 6 (six) hours as needed for mild pain, fever or headache.   Yes [provider]  albuterol (VENTOLIN HFA) 108 (90 Base) MCG/ACT inhaler INHALE 2 PUFFS INTO THE LUNGS EVERY 4 HOURS AS NEEDED FOR WHEEZING OR SHORTNESS OF BREATH Patient taking differently: Inhale 2 puffs into the lungs every 4 (four) hours as needed for wheezing or shortness of breath. 12/02/19  Yes Valentina Shaggy, MD  buPROPion (WELLBUTRIN XL) 300 MG 24 hr tablet TAKE 1 TABLET(300 MG) BY MOUTH DAILY 08/18/20  Yes Patrecia Pour, NP  busPIRone (BUSPAR) 10 MG tablet TAKE 2 TABLETS(20 MG) BY MOUTH TWICE DAILY 08/18/20  Yes Patrecia Pour, NP  Cholecalciferol (VITAMIN  D3) 5000 units CAPS Take 10,000 Units by mouth daily.    Yes [provider]  Cyanocobalamin (VITAMIN B-12) 5000 MCG TBDP Take 5,000 mcg by mouth daily.   Yes [provider]  DULoxetine (CYMBALTA) 30 MG capsule Take 2 capsules (60 mg total) by mouth daily. 07/10/20 10/08/20 Yes Lord, Asa Saunas, NP  fluticasone (FLONASE) 50 MCG/ACT nasal spray Place 1 spray into both nostrils in the morning and at bedtime.   Yes [provider]  fluticasone (FLOVENT HFA) 110 MCG/ACT inhaler Inhale 2 puffs into the lungs 2 (two) times daily. 06/02/19  Yes Valentina Shaggy, MD  montelukast (SINGULAIR) 10 MG tablet Take 1 tablet (10 mg total) by mouth at bedtime. 08/18/20  Yes Dwyane Dee, MD  nystatin (MYCOSTATIN/NYSTOP) powder Apply 1 application topically 3 (three) times daily. Patient taking differently: Apply 1 application topically 3 (three) times daily as needed (leg/stomach skin fold rashes). 07/05/20  Yes Hawks, Christy A, FNP  QUEtiapine (SEROQUEL) 25 MG tablet Take 25 mg by mouth at bedtime.   Yes [provider]  rOPINIRole (REQUIP) 3 MG tablet TAKE 1 TABLET(3 MG) BY MOUTH AT BEDTIME 08/18/20  Yes Patrecia Pour, NP  silver sulfADIAZINE (SILVADENE) 1 % cream Apply to  affected area daily 08/18/20 08/18/21 Yes Dwyane Dee, MD  vitamin E 400 UNIT capsule Take 400 Units by mouth daily.   Yes [provider]  busPIRone (BUSPAR) 10 MG tablet Take 2 tablets (20 mg total) by mouth 2 (two) times daily. 08/18/20 11/16/20  Dwyane Dee, MD  QUEtiapine (SEROQUEL) 25 MG tablet Take 1 tablet (25 mg total) by mouth at bedtime. 07/10/20 08/09/20  Patrecia Pour, NP    Physical Exam: Constitutional: Moderately built and nourished. Vitals:   08/18/20 2000 08/18/20 2015 08/18/20 2030 08/18/20 2108  BP: (!) 127/106 (!) 115/93 117/60 105/61  Pulse: (!) 104 (!) 104 (!) 104 (!) 104  Resp: (!) 22 17 17 13   Temp:      TempSrc:      SpO2: 98% 99% 98% 99%   Eyes: Anicteric no pallor. ENMT: No discharge from the ears eyes nose or mouth. Neck: No mass felt.  No neck rigidity. Respiratory: No rhonchi or crepitations. Cardiovascular: S1-S2 heard. Abdomen: Soft nontender bowel sounds present. Musculoskeletal: No edema. Skin: Multiple wounds on lower abdominal pannus and also stage II decubitus on the right posterior thigh. Neurologic: Alert awake oriented time place and person.  Moves all extremities. Psychiatric: Appears normal.  Normal affect.   Labs on Admission: I have personally reviewed following labs and imaging studies  CBC: Recent Labs  Lab 08/12/20 0409 08/13/20 0732 08/13/20 1445 08/14/20 1258 08/18/20 1936 08/18/20 2050  WBC 10.8* 9.6 9.4 7.2 12.4*  --   NEUTROABS  --   --   --  4.0 9.2*  --   HGB 8.2* 7.2* 7.8* 7.4* 9.2* 11.2*  HCT 30.7* 27.1* 28.9* 28.2* 33.5* 33.0*  MCV 80.2 80.9 81.4 81.7 80.0  --   PLT 549* 430* 435* 401* 363  --    Basic Metabolic Panel: Recent Labs  Lab 08/12/20 0409 08/13/20 0732 08/14/20 0331 08/15/20 0400 08/16/20 0407 08/18/20 1936 08/18/20 2050  NA 149* 145 142 140 141 136 140  K 3.9 3.6 3.4* 3.4* 4.5 3.7 3.7  CL 116* 116* 112* 112* 113* 103 104  CO2 22 21* 24 24 21* 21*  --   GLUCOSE 81 76 89 82 77 78  72  BUN 33* 26* 23* 17  14 11 12   CREATININE 2.85* 2.27* 2.42* 2.14* 1.66* 1.97* 2.00*  CALCIUM 9.0 8.2* 8.2* 8.2* 8.4* 8.8*  --   MG 2.2 1.8  --  2.0 1.9  --   --   PHOS 3.3 2.4*  --   --   --   --   --    GFR: Estimated Creatinine Clearance: 72.9 mL/min (A) (by C-G formula based on SCr of 2 mg/dL (H)). Liver Function Tests: Recent Labs  Lab 08/12/20 0409 08/18/20 1936  AST 90* 25  ALT 54* 24  ALKPHOS 92 64  BILITOT 0.2* 0.6  PROT 7.8 7.0  ALBUMIN 2.4* 2.3*   No results for input(s): LIPASE, AMYLASE in the last 168 hours. No results for input(s): AMMONIA in the last 168 hours. Coagulation Profile: Recent Labs  Lab 08/18/20 1936  INR 1.1   Cardiac Enzymes: Recent Labs  Lab 08/12/20 0409 08/13/20 0732 08/18/20 1936  CKTOTAL 946* 377* 46   BNP (last 3 results) No results for input(s): PROBNP in the last 8760 hours. HbA1C: No results for input(s): HGBA1C in the last 72 hours. CBG: Recent Labs  Lab 08/17/20 1139 08/17/20 1706 08/17/20 2102 08/18/20 0805 08/18/20 1159  GLUCAP 90 104* 90 78 94   Lipid Profile: No results for input(s): CHOL, HDL, LDLCALC, TRIG, CHOLHDL, LDLDIRECT in the last 72 hours. Thyroid Function Tests: No results for input(s): TSH, T4TOTAL, FREET4, T3FREE, THYROIDAB in the last 72 hours. Anemia Panel: No results for input(s): VITAMINB12, FOLATE, FERRITIN, TIBC, IRON, RETICCTPCT in the last 72 hours. Urine analysis:    Component Value Date/Time   COLORURINE YELLOW 08/18/2020 1936   APPEARANCEUR CLOUDY (A) 08/18/2020 1936   APPEARANCEUR Cloudy (A) 08/06/2016 1209   LABSPEC 1.012 08/18/2020 1936   PHURINE 5.0 08/18/2020 1936   GLUCOSEU NEGATIVE 08/18/2020 1936   HGBUR MODERATE (A) 08/18/2020 1936   HGBUR large 04/19/2008 0952   BILIRUBINUR NEGATIVE 08/18/2020 1936   BILIRUBINUR Negative 08/06/2016 1209   KETONESUR NEGATIVE 08/18/2020 1936   PROTEINUR 100 (A) 08/18/2020 1936   UROBILINOGEN 0.2 12/06/2014 2049   NITRITE NEGATIVE  08/18/2020 1936   LEUKOCYTESUR LARGE (A) 08/18/2020 1936   Sepsis Labs: @LABRCNTIP (procalcitonin:4,lacticidven:4) ) Recent Results (from the past 240 hour(s))  Blood culture (routine x 2)     Status: None   Collection Time: 08/10/20  5:20 AM   Specimen: BLOOD LEFT HAND  Result Value Ref Range Status   Specimen Description   Final    BLOOD LEFT HAND Performed at [facility], [address] [place], [place] [address]    Special Requests   Final    BOTTLES DRAWN AEROBIC ONLY Blood Culture results may not be optimal due to an inadequate volume of blood received in culture bottles Performed at [facility], [address] [place], [place] [address]    Culture   Final    NO GROWTH 5 DAYS Performed at [facility], [address] [place], [place] [address]    Report Status 08/15/2020 FINAL  Final  Resp Panel by RT-PCR (Flu A&B, Covid) Nasopharyngeal Swab     Status: None   Collection Time: 08/10/20  7:42 AM   Specimen: Nasopharyngeal Swab; Nasopharyngeal(NP) swabs in vial transport medium  Result Value Ref Range Status   SARS Coronavirus 2 by RT PCR NEGATIVE NEGATIVE Final    Comment: (NOTE) SARS-CoV-2 target nucleic acids are NOT DETECTED.  The SARS-CoV-2 RNA is generally detectable in upper respiratory specimens during the  acute phase of infection. The lowest concentration of SARS-CoV-2 viral copies this assay can detect is 138 copies/mL. A negative result does not preclude SARS-Cov-2 infection and should not be used as the sole basis for treatment or other patient management decisions. A negative result may occur with  improper specimen collection/handling, submission of specimen other than nasopharyngeal swab, presence of viral mutation(s) within the areas targeted by this assay, and inadequate number of viral copies(<138 copies/mL). A negative result must be combined with clinical observations, patient history, and  epidemiological information. The expected result is Negative.  Fact Sheet for Patients:  EntrepreneurPulse.com.au  Fact Sheet for Healthcare Providers:  IncredibleEmployment.be  This test is no t yet approved or cleared by the [place] FDA and  has been authorized for detection and/or diagnosis of SARS-CoV-2 by FDA under an Emergency Use Authorization (EUA). This EUA will remain  in effect (meaning this test can be used) for the duration of the COVID-19 declaration under Section 564(b)(1) of the Act, 21 U.S.C.section 360bbb-3(b)(1), unless the authorization is terminated  or revoked sooner.       Influenza A by PCR NEGATIVE NEGATIVE Final   Influenza B by PCR NEGATIVE NEGATIVE Final    Comment: (NOTE) The Xpert Xpress SARS-CoV-2/FLU/RSV plus assay is intended as an aid in the diagnosis of influenza from Nasopharyngeal swab specimens and should not be used as a sole basis for treatment. Nasal washings and aspirates are unacceptable for Xpert Xpress SARS-CoV-2/FLU/RSV testing.  Fact Sheet for Patients: EntrepreneurPulse.com.au  Fact Sheet for Healthcare Providers: IncredibleEmployment.be  This test is not yet approved or cleared by the [place] FDA and has been authorized for detection and/or diagnosis of SARS-CoV-2 by FDA under an Emergency Use Authorization (EUA). This EUA will remain in effect (meaning this test can be used) for the duration of the COVID-19 declaration under Section 564(b)(1) of the Act, 21 U.S.C. section 360bbb-3(b)(1), unless the authorization is terminated or revoked.  Performed at [facility], [place] [address], [place], [place] [address]   Culture, Urine     Status: Abnormal   Collection Time: 08/11/20  9:37 AM   Specimen: Urine, Catheterized  Result Value Ref Range Status   Specimen Description   Final    URINE, CATHETERIZED Performed at [place] [address], [place], [place] [address]    Special Requests   Final    Normal Performed at [facility], [place] [address] [place], [place] [address]    Culture >=100,000 COLONIES/mL PROTEUS MIRABILIS (A)  Final   Report Status 08/13/2020 FINAL  Final   Organism ID, Bacteria PROTEUS MIRABILIS (A)  Final      Susceptibility   Proteus mirabilis - MIC*    AMPICILLIN <=2 SENSITIVE Sensitive     CEFAZOLIN <=4 SENSITIVE Sensitive     CEFEPIME <=0.12 SENSITIVE Sensitive     CEFTRIAXONE <=0.25 SENSITIVE Sensitive     CIPROFLOXACIN <=0.25 SENSITIVE Sensitive     GENTAMICIN 4 SENSITIVE Sensitive     IMIPENEM 2 SENSITIVE Sensitive     NITROFURANTOIN RESISTANT Resistant     TRIMETH/SULFA <=20 SENSITIVE Sensitive     AMPICILLIN/SULBACTAM <=2 SENSITIVE Sensitive     PIP/TAZO <=4 SENSITIVE Sensitive     * >=100,000 COLONIES/mL PROTEUS MIRABILIS  Aerobic Culture w Gram Stain (superficial specimen)     Status: None   Collection Time: 08/12/20 12:46 PM   Specimen: Wound  Result Value Ref Range Status   Specimen Description WOUND  Final   Special Requests NONE  Final   Gram Stain   Final    RARE WBC PRESENT, PREDOMINANTLY MONONUCLEAR NO ORGANISMS SEEN    Culture RARE ESCHERICHIA COLI FEW ENTEROCOCCUS FAECALIS   Final   Report Status 08/16/2020 FINAL  Final   Organism ID, Bacteria ESCHERICHIA COLI  Final   Organism ID, Bacteria ENTEROCOCCUS FAECALIS  Final      Susceptibility   Escherichia coli - MIC*    AMPICILLIN <=2 SENSITIVE Sensitive     CEFAZOLIN <=4 SENSITIVE Sensitive     CEFEPIME <=0.12 SENSITIVE Sensitive     CEFTAZIDIME <=1 SENSITIVE Sensitive     CEFTRIAXONE <=0.25 SENSITIVE Sensitive     CIPROFLOXACIN <=0.25 SENSITIVE Sensitive     GENTAMICIN <=1 SENSITIVE Sensitive     IMIPENEM <=0.25 SENSITIVE Sensitive     TRIMETH/SULFA <=20 SENSITIVE Sensitive     AMPICILLIN/SULBACTAM <=2 SENSITIVE Sensitive     PIP/TAZO <=4 SENSITIVE  Sensitive     * RARE ESCHERICHIA COLI   Enterococcus faecalis - MIC*    AMPICILLIN <=2 SENSITIVE Sensitive     LEVOFLOXACIN 1 SENSITIVE Sensitive     VANCOMYCIN 1 SENSITIVE Sensitive     GENTAMICIN SYNERGY Value in next row Sensitive      SENSITIVEPerformed at [facility], [address], [place], [place] [address]    * FEW ENTEROCOCCUS FAECALIS     Radiological Exams on Admission: CT Head Wo Contrast  Result Date: 08/18/2020 CLINICAL DATA:  Fall at 2 tell today due to weakness striking head. Discharged today for treatment of abscess and wound care. EXAM: CT HEAD WITHOUT CONTRAST TECHNIQUE: Contiguous axial images were obtained from the base of the skull through the vertex without intravenous contrast. COMPARISON:  None. FINDINGS: Brain: Generalized atrophy is advanced for age. No hemorrhage. No evidence of acute ischemia. No hydrocephalus. No midline shift or mass effect. Prominence of subdural and subarachnoid spaces at the convexities felt to be did atrophy. Vascular: No hyperdense vessel. Skull: No fracture or focal lesion. Sinuses/Orbits: Opacification of left side of sphenoid sinus. Scattered mucous retention cysts in the maxillary sinuses. Opacification of right greater than left mastoid air cells. No acute orbital findings. Other: None. IMPRESSION: 1. No acute intracranial abnormality. No skull fracture. 2. Generalized atrophy, advanced for age. Electronically Signed   By: Keith Rake M.D.   On: 08/18/2020 19:52   CT Cervical Spine Wo Contrast  Result Date: 08/18/2020 CLINICAL DATA:  Fall at home tail today due to weakness striking head. EXAM: CT CERVICAL SPINE WITHOUT CONTRAST TECHNIQUE: Multidetector CT imaging of the cervical spine was performed without intravenous contrast. Multiplanar CT image reconstructions were also generated. COMPARISON:  None. FINDINGS: Assessment from C4-C5 through the cervicothoracic junction is extremely limited and near nondiagnostic due to  attenuation from habitus. Alignment: Mild scoliotic curvature of the lower cervical spine. No gross listhesis or traumatic subluxation. Skull base and vertebrae: No evidence of fracture. Non fusion posterior and anterior arch of C1, variant anatomy. Lower cervical spine is not well assessed. Soft tissues and spinal canal: No evidence of canal hematoma. No obvious prevertebral soft tissue edema, limited assessment. Disc levels: Suspected disc space narrowing at C5-C6 and C6-C7, not well assessed. Upper chest: Assessed on concurrent chest CT, reported separately. Other: Morbid obesity. IMPRESSION: 1. No evidence of acute fracture or subluxation of the cervical spine. 2. Assessment from C4-C5 through the cervicothoracic junction is extremely limited and near nondiagnostic due to attenuation from habitus. Electronically Signed  By: Narda Rutherford M.D.   On: 08/18/2020 19:54   CT L-SPINE NO CHARGE  Result Date: 08/18/2020 CLINICAL DATA:  Initial evaluation for acute trauma, fall. EXAM: CT LUMBAR SPINE WITHOUT CONTRAST TECHNIQUE: Multidetector CT imaging of the lumbar spine was performed without intravenous contrast administration. Multiplanar CT image reconstructions were also generated. COMPARISON:  Prior MRI from 11/04/2018. FINDINGS: Segmentation: Examination markedly limited by motion artifact and body habitus, with poor evaluation particularly at the lumbosacral junction. Standard segmentation. Lowest well-formed disc space labeled the L5-S1 level. Alignment: Exaggeration of the normal lumbar lordosis. No interval listhesis or malalignment. Vertebrae: Vertebral body heights grossly maintained. There is question of an irregular lucency at the level of the lumbosacral junction (series 4, image 53). While this finding may simply be artifactual nature, a possible fracture is difficult to exclude on this technically limited exam. No other visible acute fracture. Visualized sacrum and pelvis otherwise intact. No  discrete osseous lesions. Paraspinal and other soft tissues: No visible paraspinous soft tissue abnormality. Secreted contrast material present within the renal collecting systems bilaterally. Disc levels: Moderate multilevel degenerative spondylosis is similar to previous MRI. Advanced facet arthropathy present within the lower lumbar spine. No obvious high-grade spinal stenosis. IMPRESSION: 1. Markedly limited exam due to motion artifact and body habitus. Apparent irregular lucency at the level of the lumbosacral junction could simply be artifactual nature, although a possible fracture is difficult to exclude on this technically limited exam. Correlation with physical exam for possible pain at this location recommended. Further assessment with dedicated MRI would be recommended as clinically warranted. 2. No other acute traumatic injury within the lumbar spine. 3. Moderate to advanced multilevel degenerative spondylosis and facet arthropathy, grossly similar to previous MRI. Electronically Signed   By: Rise Mu M.D.   On: 08/18/2020 19:59   DG Chest Port 1 View  Result Date: 08/18/2020 CLINICAL DATA:  Chest pain EXAM: PORTABLE CHEST 1 VIEW COMPARISON:  Aug 10, 2020 FINDINGS: Lungs are clear. Heart size and pulmonary vascularity are normal. No adenopathy. No pneumothorax. No bone lesions. IMPRESSION: Lungs clear.  Cardiac silhouette normal. Electronically Signed   By: Bretta Bang III M.D.   On: 08/18/2020 18:12   CT CHEST ABDOMEN PELVIS WO CONTRAST  Result Date: 08/18/2020 CLINICAL DATA:  Fall today after recent discharge from hospital for treatment pannicular abscess and cellulitis. History of chronic renal failure. EXAM: CT CHEST, ABDOMEN AND PELVIS WITHOUT CONTRAST TECHNIQUE: Multidetector CT imaging of the chest, abdomen and pelvis was performed following the standard protocol without IV contrast. COMPARISON:  Radiographs from Aug 10, 2020 and renal ultrasound Aug 14, 2020. FINDINGS: CT  CHEST FINDINGS Cardiovascular: No significant vascular findings. Normal heart size. No pericardial effusion. Mediastinum/Nodes: No enlarged mediastinal, hilar, or axillary lymph nodes. Thyroid gland, trachea, and esophagus demonstrate no significant findings. Lungs/Pleura: Lungs are clear. No pleural effusion or pneumothorax. Musculoskeletal: Mild levoconvex curvature of the upper thoracic spine. No acute osseous abnormality visualized. There is significant soft tissue attenuation of photons secondary to patient habitus on today's exam and could not be eliminated. This reduces exam sensitivity and specificity. Within this limitation: CT ABDOMEN PELVIS FINDINGS Hepatobiliary: Unremarkable noncontrast appearance of the hepatic parenchyma. Gallbladder surgically absent. Pancreas: No acute abnormality within the limitations of the exam. Spleen: No acute abnormality within the limitations of the exam. Adrenals/Urinary Tract: Hypodense 1.7 cm right adrenal nodule, likely an adenoma. Left adrenal glands unremarkable. No hydronephrosis. Heterogeneous 7.8 x 7.6 cm lesion in the upper pole of the right  kidney corresponding with the mass seen on prior ultrasound. Bilateral nonobstructive renal stones. Urinary bladder incompletely distended limiting evaluation. Stomach/Bowel: No acute abnormality within the limitations of the exam Vascular/Lymphatic: No acute finding within limitations of the exam. Reproductive: No acute abnormality within limitations of the exam. Other: No abdominal ascites visualized. Musculoskeletal: There is multifocal cortical irregularities involving the lumbar spine for reference on image 112/13, however artifact obscures the lumbar vertebral bodies limiting evaluation. IMPRESSION: 1. No definite evidence of acute traumatic injury to the chest, abdomen, or pelvis within the limitations of this noncontrast examination which is degraded by significant soft tissue photon attenuation. Of note there are  multifocal cortical irregularities involving the lumbar spine which is felt to be secondary to artifact, would recommend correlation with point tenderness and if concern for injury lumbar spine radiographs. 2. Heterogeneous 7.8 cm right upper pole renal lesion corresponding with the mass seen on prior ultrasound, and incompletely characterized on this examination. Recommend nonemergent renal MRI with and without contrast for further evaluation. 3. Hypodense 1.7 cm right adrenal nodule, likely benign adrenal adenoma. Electronically Signed   By: Dahlia Bailiff MD   On: 08/18/2020 20:19      Assessment/Plan Principal Problem:   SIRS (systemic inflammatory response syndrome) (HCC) Active Problems:   Morbid obesity ([place])   Essential hypertension   OSA (obstructive sleep apnea)    1. SIRS with concerning features for possible delving sepsis patient is on empiric antibiotics.  Follow cultures continue hydration.  Source could be either UTI or abdominal pannus and also multiple wounds of the lower extremity.  Hypotension could also be from the dehydration. 2. History of depression on Seroquel, BuSpar Wellbutrin and Cymbalta. 3. Chronic anemia -follow CBC. 4. Chronic kidney disease stage III creatinine appears to be at baseline. 5. Multiple wounds on the pannus and stage II decubitus ulcer on the posterior thigh.  Wound team consult. 6. Renal mass will need further work-up as outpatient.   DVT prophylaxis: Lovenox. Code Status: Full code. Family Communication: Discussed with patient. Disposition Plan: May need rehab. Consults called: Social work.  Wound team. Admission status: Observation.   Rise Patience MD [facility] Pager [phone].  If 7PM-7AM, please contact night-coverage [email or web address] Password [facility]  08/18/2020, 9:52 PM

## 2020-08-18 NOTE — Sepsis Progress Note (Signed)
Notified bedside nurse of need to draw lactic acid and blood cultures.  

## 2020-08-18 NOTE — ED Provider Notes (Signed)
[facility] EMERGENCY DEPARTMENT Provider Note   CSN: [id number] Arrival date & time: 08/18/20  1704     History Chief Complaint  Patient presents with  . Weakness    Heather [name] is a 45 y.o. female history of morbid obesity, diabetes, panniculitis here presenting with fall.  Patient was just admitted for panniculitis and was found to have a UTI.  She has a Foley placed and was removed yesterday.  She also had traumatic rhabdomyolysis when she was admitted.  She was supposed to go to a nursing home but she decided to go home.  She states that she was too weak and her right leg gave out and she fell onto her back and hit her head.  She states that she was unable to get up and she has severe right leg pain.  The history is provided by the patient.       Past Medical History:  Diagnosis Date  . Allergy   . Amenorrhea   . Anemia    post partum   . Anxiety   . Anxiety   . Arthritis   . Asthma   . Back pain   . Constipation   . COPD (chronic obstructive pulmonary disease) ([place])   . Delivery with history of C-section   . Depression   . Depression   . Diabetes mellitus without complication [facility])    patient denies but states she has hyperglycemia-diet controlled  . Dysmenorrhea   . Dysrhythmia    DR Johnsie Cancel    . Ectopic pregnancy 2013  . Edema, lower extremity   . Eosinophilic esophagitis    Diagnosed at [facility]/16/2013, untreated  . Gallbladder problem   . GERD (gastroesophageal reflux disease)    HEARTBURN   TUMS  . Hard to intubate 11/07/2015  . High cholesterol   . IBS (irritable bowel syndrome)   . Leukocytosis 07/28/2008   Qualifier: Diagnosis of  By: Jonna Munro MD, Roderic Scarce    . Morbid obesity ([place])   . Neuromuscular disorder (HCC)    RESTLESS LEG   . Obesity   . Schizoaffective disorder, bipolar type ([place])   . Sepsis ([place]) 11/11/2014  . Shortness of breath    WITH EXERTION   . Sleep apnea    CPAP- in process of restarting      Patient Active Problem List   Diagnosis Date Noted  . Pressure injury of skin 08/11/2020  . Rhabdomyolysis 08/10/2020  . Acute renal failure ([place]) 08/10/2020  . PTSD (post-traumatic stress disorder) 05/15/2020  . Major depressive disorder, recurrent episode, moderate ([place]) 05/05/2020  . Generalized anxiety disorder 05/05/2020  . Blood in stool   . Gastritis and gastroduodenitis   . Benign neoplasm of sigmoid colon   . Difficult intubation 08/10/2018  . Class 3 severe obesity with serious comorbidity and body mass index (BMI) greater than or equal to 70 in adult ([place]) 06/16/2018  . Nexplanon insertion 03/27/2017  . Postpartum hypertension 03/05/2017  . Status post primary low transverse cesarean section 02/03/2017  . H/O pre-eclampsia 01/27/2017  . Gestational htn w/o significant proteinuria, third trimester 01/20/2017  . Mild persistent asthma without complication [id number]  . Perennial allergic rhinitis 11/05/2016  . Mild persistent asthma with acute exacerbation 11/05/2016  . Excess weight gain in pregnancy, second trimester 10/30/2016  . Hard to intubate 11/07/2015  . Hypoglycemia 11/07/2015  . OSA (obstructive sleep apnea) 11/07/2015  . DM type 2 (diabetes mellitus, type 2) ([place]) 12/07/2014  . Panniculitis 12/06/2014  .  Cellulitis, abdominal wall 11/11/2014  . Abdominal pain 07/14/2014  . Loose stools 07/14/2014  . Melena 01/27/2014  . Eosinophilic esophagitis 04/16/3233  . Change in bowel habits 04/28/2013  . Esophageal dysphagia 04/28/2013  . Insomnia due to mental disorder(327.02) 08/08/2011  . RLS (restless legs syndrome) 08/08/2011  . PALPITATIONS, OCCASIONAL 11/01/2009  . DISORDER, TOBACCO USE 08/25/2008  . Leukocytosis 07/28/2008  . ALLERGIC RHINITIS, SEASONAL 08/20/2007  . DYSMETABOLIC SYNDROME 57/32/2025  . Morbid obesity ([place]) 05/13/2006  . EXTERNAL HEMORRHOIDS 05/13/2006  . HYPERLIPIDEMIA 05/12/2006  . Essential hypertension 05/12/2006  . ASTHMA  05/12/2006  . GERD (gastroesophageal reflux disease) 05/12/2006  . OSTEOARTHRITIS 05/12/2006    Past Surgical History:  Procedure Laterality Date  . BIOPSY  08/13/2018   Procedure: BIOPSY;  Surgeon: Yetta Flock, MD;  Location: [facility];  Service: Gastroenterology;;  . CESAREAN SECTION MULTI-GESTATIONAL N/A 02/03/2017   Procedure: CESAREAN SECTION MULTI-GESTATIONAL;  Surgeon: Jonnie Kind, MD;  Location: [facility];  Service: Obstetrics;  Laterality: N/A;  . CHOLECYSTECTOMY    . COLONOSCOPY WITH PROPOFOL N/A 08/13/2018   Procedure: COLONOSCOPY WITH PROPOFOL;  Surgeon: Yetta Flock, MD;  Location: [facility];  Service: Gastroenterology;  Laterality: N/A;  . DENTAL SURGERY    . ESOPHAGOGASTRODUODENOSCOPY  May 2007   Dr. Gala Romney: Normal esophagus, stomach, D1, D2  . ESOPHAGOGASTRODUODENOSCOPY  06/16/2013   Dr. Carlton Adam, eosinophilic esophagitis, reactive gastropathy, no esophageal dilation  . ESOPHAGOGASTRODUODENOSCOPY (EGD) WITH PROPOFOL N/A 08/13/2018   Procedure: ESOPHAGOGASTRODUODENOSCOPY (EGD) WITH PROPOFOL;  Surgeon: Yetta Flock, MD;  Location: [facility];  Service: Gastroenterology;  Laterality: N/A;  . POLYPECTOMY  08/13/2018   Procedure: POLYPECTOMY;  Surgeon: Yetta Flock, MD;  Location: [facility];  Service: Gastroenterology;;  . TONSILLECTOMY    . TOOTH EXTRACTION  10/28/2011   Procedure: DENTAL RESTORATION/EXTRACTIONS;  Surgeon: Gae Bon, DDS;  Location: [facility] OR;  Service: Oral Surgery;;  . UPPER GASTROINTESTINAL ENDOSCOPY       OB History    Gravida  2   Para  1   Term  1   Preterm      AB  1   Living  2     SAB  0   IAB      Ectopic  1   Multiple  1   Live Births  2           Family History  Problem Relation Age of Onset  . Depression Mother   . Anxiety disorder Mother   . High blood pressure Mother   . Bipolar disorder Mother   . Eating disorder Mother   . Obesity Mother   . Hypertension  Sister   . Allergic rhinitis Sister   . Colon polyps Maternal Grandmother        32s  . Diabetes Maternal Grandmother   . Anxiety disorder Maternal Grandmother   . COPD Maternal Grandmother   . Crohn's disease Maternal Aunt   . Cancer Maternal Grandfather        prostate  . HIV/AIDS Father   . Eating disorder Father   . Obesity Father   . Liver disease Neg Hx   . Angioedema Neg Hx   . Eczema Neg Hx   . Immunodeficiency Neg Hx   . Asthma Neg Hx   . Urticaria Neg Hx   . Colon cancer Neg Hx   . Esophageal cancer Neg Hx   . Rectal cancer Neg Hx   . Stomach cancer Neg Hx  Social History   Tobacco Use  . Smoking status: Former Smoker    Packs/day: 0.50    Years: 8.00    Pack years: 4.00    Types: Cigarettes    Quit date: 04/25/2011    Years since quitting: 9.3  . Smokeless tobacco: Never Used  Vaping Use  . Vaping Use: Never used  Substance Use Topics  . Alcohol use: No  . Drug use: No    Home Medications Prior to Admission medications   Medication Sig Start Date End Date Taking? Authorizing Provider  acetaminophen (TYLENOL) 325 MG tablet Take 650 mg by mouth every 6 (six) hours as needed for mild pain, fever or headache.   Yes [provider]  albuterol (VENTOLIN HFA) 108 (90 Base) MCG/ACT inhaler INHALE 2 PUFFS INTO THE LUNGS EVERY 4 HOURS AS NEEDED FOR WHEEZING OR SHORTNESS OF BREATH Patient taking differently: Inhale 2 puffs into the lungs every 4 (four) hours as needed for wheezing or shortness of breath. 12/02/19  Yes Valentina Shaggy, MD  buPROPion (WELLBUTRIN XL) 300 MG 24 hr tablet TAKE 1 TABLET(300 MG) BY MOUTH DAILY 08/18/20  Yes Patrecia Pour, NP  busPIRone (BUSPAR) 10 MG tablet TAKE 2 TABLETS(20 MG) BY MOUTH TWICE DAILY 08/18/20  Yes Patrecia Pour, NP  Cholecalciferol (VITAMIN D3) 5000 units CAPS Take 10,000 Units by mouth daily.    Yes [provider]  Cyanocobalamin (VITAMIN B-12) 5000 MCG TBDP Take 5,000 mcg by mouth daily.   Yes  [provider]  DULoxetine (CYMBALTA) 30 MG capsule Take 2 capsules (60 mg total) by mouth daily. 07/10/20 10/08/20 Yes Lord, Asa Saunas, NP  fluticasone (FLONASE) 50 MCG/ACT nasal spray Place 1 spray into both nostrils in the morning and at bedtime.   Yes [provider]  fluticasone (FLOVENT HFA) 110 MCG/ACT inhaler Inhale 2 puffs into the lungs 2 (two) times daily. 06/02/19  Yes Valentina Shaggy, MD  montelukast (SINGULAIR) 10 MG tablet Take 1 tablet (10 mg total) by mouth at bedtime. 08/18/20  Yes Dwyane Dee, MD  nystatin (MYCOSTATIN/NYSTOP) powder Apply 1 application topically 3 (three) times daily. Patient taking differently: Apply 1 application topically 3 (three) times daily as needed (leg/stomach skin fold rashes). 07/05/20  Yes Hawks, Christy A, FNP  QUEtiapine (SEROQUEL) 25 MG tablet Take 25 mg by mouth at bedtime.   Yes [provider]  rOPINIRole (REQUIP) 3 MG tablet TAKE 1 TABLET(3 MG) BY MOUTH AT BEDTIME 08/18/20  Yes Patrecia Pour, NP  silver sulfADIAZINE (SILVADENE) 1 % cream Apply to affected area daily 08/18/20 08/18/21 Yes Dwyane Dee, MD  vitamin E 400 UNIT capsule Take 400 Units by mouth daily.   Yes [provider]  busPIRone (BUSPAR) 10 MG tablet Take 2 tablets (20 mg total) by mouth 2 (two) times daily. 08/18/20 11/16/20  Dwyane Dee, MD  QUEtiapine (SEROQUEL) 25 MG tablet Take 1 tablet (25 mg total) by mouth at bedtime. 07/10/20 08/09/20  Patrecia Pour, NP    Allergies    Bee venom, Penicillins, Penicillin g, Adhesive [tape], Latex, and Vancomycin  Review of Systems   Review of Systems  Musculoskeletal:       Right leg pain and back pain  Neurological: Positive for weakness.  All other systems reviewed and are negative.   Physical Exam Updated Vital Signs BP 105/61   Pulse (!) 104   Temp 98.5 F (36.9 C) (Oral)   Resp 13   SpO2 99%   Physical  Exam Vitals and nursing note reviewed.  Constitutional:      Comments:  Chronically ill-appearing, obese  HENT:     Head: Normocephalic.     Comments: No obvious scalp hematoma     Mouth/Throat:     Mouth: Mucous membranes are dry.  Eyes:     Extraocular Movements: Extraocular movements intact.     Pupils: Pupils are equal, round, and reactive to light.  Cardiovascular:     Rate and Rhythm: Regular rhythm. Tachycardia present.     Pulses: Normal pulses.     Heart sounds: Normal heart sounds.  Pulmonary:     Effort: Pulmonary effort is normal.     Breath sounds: Normal breath sounds.  Abdominal:     General: Abdomen is flat.     Palpations: Abdomen is soft.     Comments: Patient has large pannus with some purulent discharge under the pannus.  Musculoskeletal:     Cervical back: Normal range of motion and neck supple.     Comments: Patient has mild right paralumbar tenderness.  Patient does have a straight leg raise that is positive on the right side.  Patient has normal reflexes bilateral legs  Skin:    General: Skin is warm.     Capillary Refill: Capillary refill takes less than 2 seconds.  Neurological:     General: No focal deficit present.     Mental Status: She is oriented to person, place, and time.     Comments: Positive straight leg raise on the right side  Psychiatric:        Mood and Affect: Mood normal.        Behavior: Behavior normal.     ED Results / Procedures / Treatments   Labs (all labs ordered are listed, but only abnormal results are displayed) Labs Reviewed  LACTIC ACID, PLASMA - Abnormal; Notable for the following components:      Result Value   Lactic Acid, Venous 2.6 (*)    All other components within normal limits  COMPREHENSIVE METABOLIC PANEL - Abnormal; Notable for the following components:   CO2 21 (*)    Creatinine, Ser 1.97 (*)    Calcium 8.8 (*)    Albumin 2.3 (*)    GFR, Estimated 32 (*)    All other components within normal limits  CBC WITH DIFFERENTIAL/PLATELET - Abnormal; Notable for the following  components:   WBC 12.4 (*)    Hemoglobin 9.2 (*)    HCT 33.5 (*)    MCH 22.0 (*)    MCHC 27.5 (*)    RDW 23.9 (*)    Neutro Abs 9.2 (*)    Abs Immature Granulocytes 0.13 (*)    All other components within normal limits  APTT - Abnormal; Notable for the following components:   aPTT 22 (*)    All other components within normal limits  URINALYSIS, ROUTINE W REFLEX MICROSCOPIC - Abnormal; Notable for the following components:   APPearance CLOUDY (*)    Hgb urine dipstick MODERATE (*)    Protein, ur 100 (*)    Leukocytes,Ua LARGE (*)    RBC / HPF >50 (*)    WBC, UA >50 (*)    Bacteria, UA RARE (*)    All other components within normal limits  I-STAT CHEM 8, ED - Abnormal; Notable for the following components:   Creatinine, Ser 2.00 (*)    Hemoglobin 11.2 (*)    HCT 33.0 (*)    All other components within  normal limits  RESP PANEL BY RT-PCR (FLU A&B, COVID) ARPGX2  CULTURE, BLOOD (ROUTINE X 2)  CULTURE, BLOOD (ROUTINE X 2)  URINE CULTURE  PROTIME-INR  CK  LACTIC ACID, PLASMA  I-STAT BETA HCG BLOOD, ED ([facility], [facility], [facility] ONLY)    EKG EKG Interpretation  Date/Time:  Friday Aug 18 2020 17:12:17 EDT Ventricular Rate:  104 PR Interval:  148 QRS Duration: 152 QT Interval:  371 QTC Calculation: 488 R Axis:   66 Text Interpretation: Sinus tachycardia Consider right atrial enlargement Right bundle branch block No significant change since last tracing Confirmed by Wandra Arthurs ([id number]) on 08/18/2020 5:25:47 PM   Radiology CT Head Wo Contrast  Result Date: 08/18/2020 CLINICAL DATA:  Fall at 2 tell today due to weakness striking head. Discharged today for treatment of abscess and wound care. EXAM: CT HEAD WITHOUT CONTRAST TECHNIQUE: Contiguous axial images were obtained from the base of the skull through the vertex without intravenous contrast. COMPARISON:  None. FINDINGS: Brain: Generalized atrophy is advanced for age. No hemorrhage. No evidence of acute ischemia. No hydrocephalus. No  midline shift or mass effect. Prominence of subdural and subarachnoid spaces at the convexities felt to be did atrophy. Vascular: No hyperdense vessel. Skull: No fracture or focal lesion. Sinuses/Orbits: Opacification of left side of sphenoid sinus. Scattered mucous retention cysts in the maxillary sinuses. Opacification of right greater than left mastoid air cells. No acute orbital findings. Other: None. IMPRESSION: 1. No acute intracranial abnormality. No skull fracture. 2. Generalized atrophy, advanced for age. Electronically Signed   By: Keith Rake M.D.   On: 08/18/2020 19:52   CT Cervical Spine Wo Contrast  Result Date: 08/18/2020 CLINICAL DATA:  Fall at home tail today due to weakness striking head. EXAM: CT CERVICAL SPINE WITHOUT CONTRAST TECHNIQUE: Multidetector CT imaging of the cervical spine was performed without intravenous contrast. Multiplanar CT image reconstructions were also generated. COMPARISON:  None. FINDINGS: Assessment from C4-C5 through the cervicothoracic junction is extremely limited and near nondiagnostic due to attenuation from habitus. Alignment: Mild scoliotic curvature of the lower cervical spine. No gross listhesis or traumatic subluxation. Skull base and vertebrae: No evidence of fracture. Non fusion posterior and anterior arch of C1, variant anatomy. Lower cervical spine is not well assessed. Soft tissues and spinal canal: No evidence of canal hematoma. No obvious prevertebral soft tissue edema, limited assessment. Disc levels: Suspected disc space narrowing at C5-C6 and C6-C7, not well assessed. Upper chest: Assessed on concurrent chest CT, reported separately. Other: Morbid obesity. IMPRESSION: 1. No evidence of acute fracture or subluxation of the cervical spine. 2. Assessment from C4-C5 through the cervicothoracic junction is extremely limited and near nondiagnostic due to attenuation from habitus. Electronically Signed   By: Keith Rake M.D.   On: 08/18/2020  19:54   CT L-SPINE NO CHARGE  Result Date: 08/18/2020 CLINICAL DATA:  Initial evaluation for acute trauma, fall. EXAM: CT LUMBAR SPINE WITHOUT CONTRAST TECHNIQUE: Multidetector CT imaging of the lumbar spine was performed without intravenous contrast administration. Multiplanar CT image reconstructions were also generated. COMPARISON:  Prior MRI from 11/04/2018. FINDINGS: Segmentation: Examination markedly limited by motion artifact and body habitus, with poor evaluation particularly at the lumbosacral junction. Standard segmentation. Lowest well-formed disc space labeled the L5-S1 level. Alignment: Exaggeration of the normal lumbar lordosis. No interval listhesis or malalignment. Vertebrae: Vertebral body heights grossly maintained. There is question of an irregular lucency at the level of the lumbosacral junction (series 4, image 53). While this finding may  simply be artifactual nature, a possible fracture is difficult to exclude on this technically limited exam. No other visible acute fracture. Visualized sacrum and pelvis otherwise intact. No discrete osseous lesions. Paraspinal and other soft tissues: No visible paraspinous soft tissue abnormality. Secreted contrast material present within the renal collecting systems bilaterally. Disc levels: Moderate multilevel degenerative spondylosis is similar to previous MRI. Advanced facet arthropathy present within the lower lumbar spine. No obvious high-grade spinal stenosis. IMPRESSION: 1. Markedly limited exam due to motion artifact and body habitus. Apparent irregular lucency at the level of the lumbosacral junction could simply be artifactual nature, although a possible fracture is difficult to exclude on this technically limited exam. Correlation with physical exam for possible pain at this location recommended. Further assessment with dedicated MRI would be recommended as clinically warranted. 2. No other acute traumatic injury within the lumbar spine. 3.  Moderate to advanced multilevel degenerative spondylosis and facet arthropathy, grossly similar to previous MRI. Electronically Signed   By: Jeannine Boga M.D.   On: 08/18/2020 19:59   DG Chest Port 1 View  Result Date: 08/18/2020 CLINICAL DATA:  Chest pain EXAM: PORTABLE CHEST 1 VIEW COMPARISON:  Aug 10, 2020 FINDINGS: Lungs are clear. Heart size and pulmonary vascularity are normal. No adenopathy. No pneumothorax. No bone lesions. IMPRESSION: Lungs clear.  Cardiac silhouette normal. Electronically Signed   By: Lowella Grip III M.D.   On: 08/18/2020 18:12   CT CHEST ABDOMEN PELVIS WO CONTRAST  Result Date: 08/18/2020 CLINICAL DATA:  Fall today after recent discharge from hospital for treatment pannicular abscess and cellulitis. History of chronic renal failure. EXAM: CT CHEST, ABDOMEN AND PELVIS WITHOUT CONTRAST TECHNIQUE: Multidetector CT imaging of the chest, abdomen and pelvis was performed following the standard protocol without IV contrast. COMPARISON:  Radiographs from Aug 10, 2020 and renal ultrasound Aug 14, 2020. FINDINGS: CT CHEST FINDINGS Cardiovascular: No significant vascular findings. Normal heart size. No pericardial effusion. Mediastinum/Nodes: No enlarged mediastinal, hilar, or axillary lymph nodes. Thyroid gland, trachea, and esophagus demonstrate no significant findings. Lungs/Pleura: Lungs are clear. No pleural effusion or pneumothorax. Musculoskeletal: Mild levoconvex curvature of the upper thoracic spine. No acute osseous abnormality visualized. There is significant soft tissue attenuation of photons secondary to patient habitus on today's exam and could not be eliminated. This reduces exam sensitivity and specificity. Within this limitation: CT ABDOMEN PELVIS FINDINGS Hepatobiliary: Unremarkable noncontrast appearance of the hepatic parenchyma. Gallbladder surgically absent. Pancreas: No acute abnormality within the limitations of the exam. Spleen: No acute abnormality within  the limitations of the exam. Adrenals/Urinary Tract: Hypodense 1.7 cm right adrenal nodule, likely an adenoma. Left adrenal glands unremarkable. No hydronephrosis. Heterogeneous 7.8 x 7.6 cm lesion in the upper pole of the right kidney corresponding with the mass seen on prior ultrasound. Bilateral nonobstructive renal stones. Urinary bladder incompletely distended limiting evaluation. Stomach/Bowel: No acute abnormality within the limitations of the exam Vascular/Lymphatic: No acute finding within limitations of the exam. Reproductive: No acute abnormality within limitations of the exam. Other: No abdominal ascites visualized. Musculoskeletal: There is multifocal cortical irregularities involving the lumbar spine for reference on image 112/13, however artifact obscures the lumbar vertebral bodies limiting evaluation. IMPRESSION: 1. No definite evidence of acute traumatic injury to the chest, abdomen, or pelvis within the limitations of this noncontrast examination which is degraded by significant soft tissue photon attenuation. Of note there are multifocal cortical irregularities involving the lumbar spine which is felt to be secondary to artifact, would recommend correlation with point  tenderness and if concern for injury lumbar spine radiographs. 2. Heterogeneous 7.8 cm right upper pole renal lesion corresponding with the mass seen on prior ultrasound, and incompletely characterized on this examination. Recommend nonemergent renal MRI with and without contrast for further evaluation. 3. Hypodense 1.7 cm right adrenal nodule, likely benign adrenal adenoma. Electronically Signed   By: Dahlia Bailiff MD   On: 08/18/2020 20:19    Procedures Procedures  CRITICAL CARE Performed by: Wandra Arthurs   Total critical care time: 30 minutes  Critical care time was exclusive of separately billable procedures and treating other patients.  Critical care was necessary to treat or prevent imminent or life-threatening  deterioration.  Critical care was time spent personally by me on the following activities: development of treatment plan with patient and/or surrogate as well as nursing, discussions with consultants, evaluation of patient's response to treatment, examination of patient, obtaining history from patient or surrogate, ordering and performing treatments and interventions, ordering and review of laboratory studies, ordering and review of radiographic studies, pulse oximetry and re-evaluation of patient's condition.  Angiocath insertion Performed by: Wandra Arthurs  Consent: Verbal consent obtained. Risks and benefits: risks, benefits and alternatives were discussed Time out: Immediately prior to procedure a "time out" was called to verify the correct patient, procedure, equipment, support staff and site/side marked as required.  Preparation: Patient was prepped and draped in the usual sterile fashion.  Vein Location: R forearm   Ultrasound Guided  Gauge: 20 long   Normal blood return and flush without difficulty Patient tolerance: Patient tolerated the procedure well with no immediate complications.      Medications Ordered in ED Medications  lactated ringers infusion (has no administration in time range)  sodium chloride 0.9 % bolus 1,000 mL (0 mLs Intravenous Stopped 08/18/20 2109)    And  sodium chloride 0.9 % bolus 600 mL (has no administration in time range)  cefTRIAXone (ROCEPHIN) 1 g in sodium chloride 0.9 % 100 mL IVPB (0 g Intravenous Stopped 08/18/20 1909)    ED Course  I have reviewed the triage vital signs and the nursing notes.  Pertinent labs & imaging results that were available during my care of the patient were reviewed by me and considered in my medical decision making (see chart for details).    MDM Rules/Calculators/A&P                         Shauntell Costin Kittler is a 45 y.o. female here presenting with fall.  Patient has a recurrent fall.  She was recently  admitted for rhabdomyolysis and has a Proteus UTI.  Patient also has obvious panniculitis.  Plan to repeat a sepsis work-up and put in Foley so her panniculitis will not get worse  9:16 PM Patient is a difficult IV stick and was able to put in the IV.  Her lactate is 2.6.  Her UA showed UTI again.  Patient was given Rocephin in the ED.  Her CK is normal today.  Her creatinine is baseline.  Trauma work-up did not reveal any fractures. hospitalist to readmit.    Final Clinical Impression(s) / ED Diagnoses Final diagnoses:  Fall    Rx / DC Orders ED Discharge Orders    None       Drenda Freeze, MD 08/18/20 2306

## 2020-08-18 NOTE — ED Triage Notes (Addendum)
Pt arrives from [facility] via EMS. Pt was discharged from [facility] today was treated for abcesses and wound care in multiple abdominal folds. H of chronic kidney failure. Pt reports that she fell at hotel today due to weakness, hitting head, complains of head and back pain. Denies loc, no signs of trauma or injury. Pt was hypotensive with EMS. Pt rehab and follow up after discharge from [place] [facility] [place] [facility]. Pt a/o x4. IV attempts unsuccessful.  CBG 134 BP 91/40 HR 113 RR 35 O2 97% RA

## 2020-08-18 NOTE — Plan of Care (Signed)

## 2020-08-18 NOTE — Sepsis Progress Note (Signed)
Code sepsis protocol being monitored by eLink. 

## 2020-08-19 DIAGNOSIS — J449 Chronic obstructive pulmonary disease, unspecified: Secondary | ICD-10-CM | POA: Diagnosis present

## 2020-08-19 DIAGNOSIS — E871 Hypo-osmolality and hyponatremia: Secondary | ICD-10-CM | POA: Diagnosis present

## 2020-08-19 DIAGNOSIS — E872 Acidosis: Secondary | ICD-10-CM | POA: Diagnosis present

## 2020-08-19 DIAGNOSIS — R6510 Systemic inflammatory response syndrome (SIRS) of non-infectious origin without acute organ dysfunction: Secondary | ICD-10-CM | POA: Diagnosis present

## 2020-08-19 DIAGNOSIS — N179 Acute kidney failure, unspecified: Secondary | ICD-10-CM | POA: Diagnosis present

## 2020-08-19 DIAGNOSIS — E860 Dehydration: Secondary | ICD-10-CM | POA: Diagnosis present

## 2020-08-19 DIAGNOSIS — I959 Hypotension, unspecified: Secondary | ICD-10-CM | POA: Diagnosis present

## 2020-08-19 DIAGNOSIS — N390 Urinary tract infection, site not specified: Secondary | ICD-10-CM | POA: Diagnosis present

## 2020-08-19 DIAGNOSIS — E538 Deficiency of other specified B group vitamins: Secondary | ICD-10-CM | POA: Diagnosis present

## 2020-08-19 DIAGNOSIS — D631 Anemia in chronic kidney disease: Secondary | ICD-10-CM | POA: Diagnosis present

## 2020-08-19 DIAGNOSIS — I1310 Hypertensive heart and chronic kidney disease without heart failure, with stage 1 through stage 4 chronic kidney disease, or unspecified chronic kidney disease: Secondary | ICD-10-CM | POA: Diagnosis present

## 2020-08-19 DIAGNOSIS — D638 Anemia in other chronic diseases classified elsewhere: Secondary | ICD-10-CM | POA: Diagnosis not present

## 2020-08-19 DIAGNOSIS — M793 Panniculitis, unspecified: Secondary | ICD-10-CM | POA: Diagnosis present

## 2020-08-19 DIAGNOSIS — N1831 Chronic kidney disease, stage 3a: Secondary | ICD-10-CM | POA: Diagnosis present

## 2020-08-19 DIAGNOSIS — G4733 Obstructive sleep apnea (adult) (pediatric): Secondary | ICD-10-CM | POA: Diagnosis not present

## 2020-08-19 DIAGNOSIS — Z20822 Contact with and (suspected) exposure to covid-19: Secondary | ICD-10-CM | POA: Diagnosis present

## 2020-08-19 DIAGNOSIS — W1830XA Fall on same level, unspecified, initial encounter: Secondary | ICD-10-CM | POA: Diagnosis present

## 2020-08-19 DIAGNOSIS — Y92009 Unspecified place in unspecified non-institutional (private) residence as the place of occurrence of the external cause: Secondary | ICD-10-CM | POA: Diagnosis not present

## 2020-08-19 DIAGNOSIS — E1122 Type 2 diabetes mellitus with diabetic chronic kidney disease: Secondary | ICD-10-CM | POA: Diagnosis present

## 2020-08-19 DIAGNOSIS — Z881 Allergy status to other antibiotic agents status: Secondary | ICD-10-CM | POA: Diagnosis not present

## 2020-08-19 DIAGNOSIS — M545 Low back pain, unspecified: Secondary | ICD-10-CM | POA: Diagnosis not present

## 2020-08-19 DIAGNOSIS — R6511 Systemic inflammatory response syndrome (SIRS) of non-infectious origin with acute organ dysfunction: Secondary | ICD-10-CM | POA: Diagnosis present

## 2020-08-19 DIAGNOSIS — G2581 Restless legs syndrome: Secondary | ICD-10-CM | POA: Diagnosis present

## 2020-08-19 DIAGNOSIS — R5381 Other malaise: Secondary | ICD-10-CM | POA: Diagnosis not present

## 2020-08-19 DIAGNOSIS — E662 Morbid (severe) obesity with alveolar hypoventilation: Secondary | ICD-10-CM | POA: Diagnosis present

## 2020-08-19 DIAGNOSIS — R000 Tachycardia, unspecified: Secondary | ICD-10-CM | POA: Diagnosis present

## 2020-08-19 DIAGNOSIS — B964 Proteus (mirabilis) (morganii) as the cause of diseases classified elsewhere: Secondary | ICD-10-CM | POA: Diagnosis present

## 2020-08-19 DIAGNOSIS — G4700 Insomnia, unspecified: Secondary | ICD-10-CM | POA: Diagnosis present

## 2020-08-19 DIAGNOSIS — N2889 Other specified disorders of kidney and ureter: Secondary | ICD-10-CM | POA: Diagnosis present

## 2020-08-19 DIAGNOSIS — T148XXA Other injury of unspecified body region, initial encounter: Secondary | ICD-10-CM | POA: Diagnosis not present

## 2020-08-19 DIAGNOSIS — F319 Bipolar disorder, unspecified: Secondary | ICD-10-CM | POA: Diagnosis present

## 2020-08-19 DIAGNOSIS — G8929 Other chronic pain: Secondary | ICD-10-CM | POA: Diagnosis not present

## 2020-08-19 DIAGNOSIS — Z6841 Body Mass Index (BMI) 40.0 and over, adult: Secondary | ICD-10-CM | POA: Diagnosis not present

## 2020-08-19 LAB — COMPREHENSIVE METABOLIC PANEL
ALT: 21 U/L (ref 0–44)
AST: 20 U/L (ref 15–41)
Albumin: 2.1 g/dL — ABNORMAL LOW (ref 3.5–5.0)
Alkaline Phosphatase: 60 U/L (ref 38–126)
Anion gap: 9 (ref 5–15)
BUN: 11 mg/dL (ref 6–20)
CO2: 22 mmol/L (ref 22–32)
Calcium: 8.8 mg/dL — ABNORMAL LOW (ref 8.9–10.3)
Chloride: 105 mmol/L (ref 98–111)
Creatinine, Ser: 1.77 mg/dL — ABNORMAL HIGH (ref 0.44–1.00)
GFR, Estimated: 36 mL/min — ABNORMAL LOW (ref >60)
Glucose, Bld: 77 mg/dL (ref 70–99)
Potassium: 3.4 mmol/L — ABNORMAL LOW (ref 3.5–5.1)
Sodium: 136 mmol/L (ref 135–145)
Total Bilirubin: 0.4 mg/dL (ref 0.3–1.2)
Total Protein: 6.2 g/dL — ABNORMAL LOW (ref 6.5–8.1)

## 2020-08-19 LAB — CBC
HCT: 28.5 % — ABNORMAL LOW (ref 36.0–46.0)
Hemoglobin: 7.8 g/dL — ABNORMAL LOW (ref 12.0–15.0)
MCH: 21.7 pg — ABNORMAL LOW (ref 26.0–34.0)
MCHC: 27.4 g/dL — ABNORMAL LOW (ref 30.0–36.0)
MCV: 79.2 fL — ABNORMAL LOW (ref 80.0–100.0)
Platelets: 361 10*3/uL (ref 150–400)
RBC: 3.6 MIL/uL — ABNORMAL LOW (ref 3.87–5.11)
RDW: 23.6 % — ABNORMAL HIGH (ref 11.5–15.5)
WBC: 8.2 10*3/uL (ref 4.0–10.5)
nRBC: 0 % (ref 0.0–0.2)

## 2020-08-19 MED ORDER — HEPARIN SODIUM (PORCINE) 5000 UNIT/ML IJ SOLN: 5000.0000 [IU] | Freq: Three times a day (TID) | INTRAMUSCULAR | Status: DC

## 2020-08-19 MED ORDER — CHLORHEXIDINE GLUCONATE CLOTH 2 % EX PADS
6.0000 | MEDICATED_PAD | Freq: Every day | CUTANEOUS | Status: DC
  Administered 2020-08-19 – 2020-08-23 (×5): 6 via TOPICAL

## 2020-08-19 MED ORDER — SODIUM CHLORIDE 0.9 % IV SOLN
100.0000 mg | Freq: Two times a day (BID) | INTRAVENOUS | Status: DC
  Filled 2020-08-19: qty 100

## 2020-08-19 MED ORDER — DIPHENHYDRAMINE HCL 50 MG/ML IJ SOLN
50.0000 mg | INTRAMUSCULAR | Status: DC
  Administered 2020-08-20: 50 mg via INTRAVENOUS
  Filled 2020-08-19: qty 1

## 2020-08-19 MED ORDER — ALUM & MAG HYDROXIDE-SIMETH 200-200-20 MG/5ML PO SUSP: 15.0000 mL | Freq: Four times a day (QID) | ORAL | Status: DC | PRN

## 2020-08-19 MED ORDER — VANCOMYCIN HCL 1750 MG/350ML IV SOLN
1750.0000 mg | INTRAVENOUS | Status: DC
  Administered 2020-08-20: 1750 mg via INTRAVENOUS
  Filled 2020-08-19: qty 350

## 2020-08-19 MED ORDER — DIPHENHYDRAMINE HCL 25 MG PO CAPS: 25.0000 mg | ORAL_CAPSULE | Freq: Four times a day (QID) | ORAL | Status: DC | PRN

## 2020-08-19 MED ORDER — DIPHENHYDRAMINE HCL 50 MG/ML IJ SOLN
50.0000 mg | Freq: Once | INTRAMUSCULAR | Status: AC
  Administered 2020-08-19: 50 mg via INTRAVENOUS
  Filled 2020-08-19: qty 1

## 2020-08-19 MED ORDER — VANCOMYCIN HCL 10 G IV SOLR
2500.0000 mg | INTRAVENOUS | Status: AC
  Administered 2020-08-19: 2500 mg via INTRAVENOUS
  Filled 2020-08-19: qty 2500

## 2020-08-19 MED ORDER — ENOXAPARIN SODIUM 100 MG/ML IJ SOSY
100.0000 mg | PREFILLED_SYRINGE | INTRAMUSCULAR | Status: DC
  Administered 2020-08-19 – 2020-08-22 (×4): 100 mg via SUBCUTANEOUS
  Filled 2020-08-19 (×6): qty 1

## 2020-08-19 NOTE — Progress Notes (Signed)
Pharmacy Antibiotic Note  Heather [name] is a 45 y.o. female admitted on 08/18/2020 with sepsis - recent admission for pannicullitis and UTI.  Pharmacy has been consulted for Vancomycin dosing. Noted h/o Redman syndrome with Vancomycin but chart reports that pt can tolerate with Bendadryl premedication. Dr. Conception Oms wants to use Vancomycin and give each dose over longer period and premedicate with Benadryl.  Plan: Premedicate 30 min prior to each dose with Benadryl 50mg  IV Also Benadryl 25mg  po q6hprn for itching Vancomycin 2500 mg IV now then 1750 mg IV Q 24 hrs. Goal AUC 400-550. Each dose will be run over twice as long as standard Expected AUC: 500 SCr used: 1.77; Vd coeff 0.5 Will f/u renal function, micro data, and pt's clinical condition Vanc levels prn   Height: 5\' 3"  (160 cm) Weight: (!) 212 kg (467 lb 6 oz) IBW/kg (Calculated) : 52.4  Temp (24hrs), Avg:98.3 F (36.8 C), Min:97.9 F (36.6 C), Max:98.5 F (36.9 C)  Recent Labs  Lab 08/13/20 0732 08/13/20 1445 08/14/20 0331 08/14/20 1258 08/15/20 0400 08/16/20 0407 08/18/20 1936 08/18/20 2050 08/19/20 0208  WBC 9.6 9.4  --  7.2  --   --  12.4*  --  8.2  CREATININE 2.27*  --    < >  --  2.14* 1.66* 1.97* 2.00* 1.77*  LATICACIDVEN  --   --   --   --   --   --  2.6*  --   --    < > = values in this interval not displayed.    Estimated Creatinine Clearance: 76 mL/min (A) (by C-G formula based on SCr of 1.77 mg/dL (H)).    Allergies  Allergen Reactions  . Bee Venom Shortness Of Breath  . Penicillins Anaphylaxis    Has patient had a PCN reaction causing immediate rash, facial/tongue/throat swelling, SOB or lightheadedness with hypotension: No Has patient had a PCN reaction causing severe rash involving mucus membranes or skin necrosis: No Has patient had a PCN reaction that required hospitalization No Has patient had a PCN reaction occurring within the last 10 years: No If all of the above answers are "NO",  then may proceed with Cephalosporin use.'  REACTION: Angioedema  . Penicillin G   . Adhesive [Tape] Rash  . Latex Rash  . Vancomycin Other (See Comments)    Pt can tolerate Vancomycin but did cause Red-Man Syndrome.  Recommend to pre-medicate with Benadryl before doses administered.      Antimicrobials this admission: 5/14 Vanc >>  5/13 Ceftriaxone >>   Microbiology results: 5/13 BCx:  5/13 UCx:    Thank you for allowing pharmacy to be a part of this patient's care.  Sherlon Handing, PharmD, BCPS Please see amion for complete clinical pharmacist phone list 08/19/2020 3:46 AM

## 2020-08-19 NOTE — Progress Notes (Signed)
Inpatient Rehab Admissions Coordinator:  Consult received. Awaiting therapy evaluations and recommendations to help determine if pt a candidate for CIR.  Will continue to follow.   Gayland Curry, [place], [place] Admissions Coordinator [phone]

## 2020-08-19 NOTE — Progress Notes (Addendum)
PROGRESS NOTE    Heather [name]   ZYS:[id number]  DOB: [birth year]  DOA: 08/18/2020 PCP: Lucianne Lei, MD   Brief Narrative:  Heather [name] is a 45 year old female with morbid obesity, obstructive sleep apnea, COPD, anxiety and depression, diabetes mellitus type 2, bipolar disorder, renal mass, restless leg syndrome and numerous small wounds under her pannus, upper thighs and between her legs. The patient was discharged from [facility] on 5/13 where she was admitted since 5/4. She was treated for a complicated Proteus mirabilis UTI, AKI which improved with IV fluids, traumatic rhabdomyolysis secondary to a fall.  It was recommended that she be transferred to [facility] inpatient rehab however, she decided that she would rather go back to her motel. While walking in the motel on her way back home yesterday, she suddenly felt weak, her right leg gave out and she fell to the floor.  She was brought back to the hospital and noted to be hypotensive and tachycardic.   Blood pressure was about 81/49, heart rate in low 100s.  Her creatinine was 2.00, increased from 1.66 when last checked on 5/11. WBC or mildly elevated at 12.4, increased from 7.2 when last checked on 5/9. UA revealed large leukocytes, negative nitrite, rare bacteria, greater than 50 RBCs and greater than 50 WBCs. She was given 1600 cc normal saline bolus and a gram of Rocephin in the ED.  Subjective: She has no complaints at the moment.  She is agreeable to go to a rehab facility.    Assessment & Plan:   Principal Problem:   SIRS (systemic inflammatory response syndrome) -She is currently receiving vancomycin, ceftriaxone and doxycycline -Recent Proteus mirabilis urinary tract infection was treated for 5 days with appropriate antibiotics - Panniculitis was treated with vancomycin and then Zyvox for 7 days - Both antibiotics was stopped on 5/11 - Follow-up on blood and urine cultures -Abdominal wounds have a slight  amount of yellowish discharge on them-we will reassess tomorrow  Active Problems:  CKD stage III - Her creatinine had improved to about 1.66 on 5/11 -Today is 1.77   Right renal mass - 8.4 cm right renal mass noted on renal ultrasound -Previously noted to be around 6 cm & multiseptated on a lumbar MRI in 7/20 - discussed with urology on her last admission- it was recommended that she will need an outpatient work-up - The patient states that she was scheduled to see a urologist on 08/16/2020  Morbid obesity - Body mass index is 82.79 kg/m.   Moisture wounds on abdomen, thighs, upper legs - per last wound care eval > Per WOC: "Continue antimicrobial wicking fabric for coverage of yeast and moisture management.Add silvadene for topical care to open lesions.Make sure that she is bathed and all products removed completely, allowed to dry and reapplied."  Anemia, folic acid deficiency - Hemoglobin has been in the 7-8 range- -Anemia panel obtained on 5/5 revealed a folic acid that was 4.2, ferritin was normal and iron binding capacity was decreased B12 was elevated - We will start oral folate replacement  Bipolar disorder - cotn Wellbutrin, Buspar, Cymbalta, Seroquel  RLS - cont Requip  Time spent in minutes: 35 DVT prophylaxis:  Lovenox   Code Status: full code Family Communication:  Level of Care: Level of care: Progressive Disposition Plan:  Status is: Observation  The patient will require care spanning > 2 midnights and should be moved to inpatient because: IV treatments appropriate due to intensity of illness or inability to  take PO  Dispo: The patient is from: Home              Anticipated d/c is to: CIR              Patient currently is not medically stable to d/c.   Difficult to place patient Yes       Consultants:   none Procedures:   none Antimicrobials:  Anti-infectives (From admission, onward)   Start     Dose/Rate Route Frequency Ordered Stop    08/20/20 0600  vancomycin (VANCOREADY) IVPB 1750 mg/350 mL        1,750 mg 87.5 mL/hr over 240 Minutes Intravenous Every 24 hours 08/19/20 0345     08/19/20 0430  doxycycline (VIBRAMYCIN) 100 mg in sodium chloride 0.9 % 250 mL IVPB  Status:  Discontinued        100 mg 125 mL/hr over 120 Minutes Intravenous Every 12 hours 08/19/20 0335 08/19/20 0337   08/19/20 0430  vancomycin (VANCOCIN) 2,500 mg in sodium chloride 0.9 % 500 mL IVPB        2,500 mg 125 mL/hr over 240 Minutes Intravenous NOW 08/19/20 0341 08/19/20 0838   08/18/20 1730  cefTRIAXone (ROCEPHIN) 1 g in sodium chloride 0.9 % 100 mL IVPB        1 g 200 mL/hr over 30 Minutes Intravenous Every 24 hours 08/18/20 1715         Objective: Vitals:   08/19/20 0028 08/19/20 0440 08/19/20 0752 08/19/20 1115  BP: (!) 95/47 (!) 106/57 122/63 (!) 112/54  Pulse: 100 89 87 95  Resp: 19 16 16 18   Temp: 98.4 F (36.9 C) 97.9 F (36.6 C) 97.7 F (36.5 C) 98 F (36.7 C)  TempSrc: Oral Oral Oral Oral  SpO2: 97% 99% 99% 100%  Weight:      Height:        Intake/Output Summary (Last 24 hours) at 08/19/2020 1148 Last data filed at 08/19/2020 0800 Gross per 24 hour  Intake 1693.52 ml  Output 850 ml  Net 843.52 ml   Filed Weights   08/18/20 2230  Weight: (!) 212 kg    Examination: General exam: Appears comfortable  HEENT: PERRLA, oral mucosa very dry, no sclera icterus or thrush Respiratory system: Clear to auscultation. Respiratory effort normal. Cardiovascular system: S1 & S2 heard, RRR.   Gastrointestinal system: Abdomen soft, non-tender, nondistended. Normal bowel sounds. Central nervous system: Alert and oriented. No focal neurological deficits. Extremities: No cyanosis, clubbing or edema Skin: uncountable amount of shallow ulcers under pannus, on upper thighs and in grown- mild yellowish discharge noted Psychiatry:  Mood & affect appropriate.     Data Reviewed: I have personally reviewed following labs and imaging  studies  CBC: Recent Labs  Lab 08/13/20 0732 08/13/20 1445 08/14/20 1258 08/18/20 1936 08/18/20 2050 08/19/20 0208  WBC 9.6 9.4 7.2 12.4*  --  8.2  NEUTROABS  --   --  4.0 9.2*  --   --   HGB 7.2* 7.8* 7.4* 9.2* 11.2* 7.8*  HCT 27.1* 28.9* 28.2* 33.5* 33.0* 28.5*  MCV 80.9 81.4 81.7 80.0  --  79.2*  PLT 430* 435* 401* 363  --  024   Basic Metabolic Panel: Recent Labs  Lab 08/13/20 0732 08/14/20 0331 08/15/20 0400 08/16/20 0407 08/18/20 1936 08/18/20 2050 08/19/20 0208  NA 145 142 140 141 136 140 136  K 3.6 3.4* 3.4* 4.5 3.7 3.7 3.4*  CL 116* 112* 112* 113* 103 104 105  CO2 21* 24 24 21* 21*  --  22  GLUCOSE 76 89 82 77 78 72 77  BUN 26* 23* 17 14 11 12 11   CREATININE 2.27* 2.42* 2.14* 1.66* 1.97* 2.00* 1.77*  CALCIUM 8.2* 8.2* 8.2* 8.4* 8.8*  --  8.8*  MG 1.8  --  2.0 1.9  --   --   --   PHOS 2.4*  --   --   --   --   --   --    GFR: Estimated Creatinine Clearance: 76 mL/min (A) (by C-G formula based on SCr of 1.77 mg/dL (H)). Liver Function Tests: Recent Labs  Lab 08/18/20 1936 08/19/20 0208  AST 25 20  ALT 24 21  ALKPHOS 64 60  BILITOT 0.6 0.4  PROT 7.0 6.2*  ALBUMIN 2.3* 2.1*   No results for input(s): LIPASE, AMYLASE in the last 168 hours. No results for input(s): AMMONIA in the last 168 hours. Coagulation Profile: Recent Labs  Lab 08/18/20 1936  INR 1.1   Cardiac Enzymes: Recent Labs  Lab 08/13/20 0732 08/18/20 1936  CKTOTAL 377* 46   BNP (last 3 results) No results for input(s): PROBNP in the last 8760 hours. HbA1C: No results for input(s): HGBA1C in the last 72 hours. CBG: Recent Labs  Lab 08/17/20 1139 08/17/20 1706 08/17/20 2102 08/18/20 0805 08/18/20 1159  GLUCAP 90 104* 90 78 94   Lipid Profile: No results for input(s): CHOL, HDL, LDLCALC, TRIG, CHOLHDL, LDLDIRECT in the last 72 hours. Thyroid Function Tests: No results for input(s): TSH, T4TOTAL, FREET4, T3FREE, THYROIDAB in the last 72 hours. Anemia Panel: No results  for input(s): VITAMINB12, FOLATE, FERRITIN, TIBC, IRON, RETICCTPCT in the last 72 hours. Urine analysis:    Component Value Date/Time   COLORURINE YELLOW 08/18/2020 1936   APPEARANCEUR CLOUDY (A) 08/18/2020 1936   APPEARANCEUR Cloudy (A) 08/06/2016 1209   LABSPEC 1.012 08/18/2020 1936   PHURINE 5.0 08/18/2020 1936   GLUCOSEU NEGATIVE 08/18/2020 1936   HGBUR MODERATE (A) 08/18/2020 1936   HGBUR large 04/19/2008 0952   BILIRUBINUR NEGATIVE 08/18/2020 1936   BILIRUBINUR Negative 08/06/2016 [place] 08/18/2020 1936   PROTEINUR 100 (A) 08/18/2020 1936   UROBILINOGEN 0.2 12/06/2014 2049   NITRITE NEGATIVE 08/18/2020 1936   LEUKOCYTESUR LARGE (A) 08/18/2020 1936   Sepsis Labs: @LABRCNTIP (procalcitonin:4,lacticidven:4) ) Recent Results (from the past 240 hour(s))  Blood culture (routine x 2)     Status: None   Collection Time: 08/10/20  5:20 AM   Specimen: BLOOD LEFT HAND  Result Value Ref Range Status   Specimen Description   Final    BLOOD LEFT HAND Performed at [facility], [place] [address], [place], [place] [address]    Special Requests   Final    BOTTLES DRAWN AEROBIC ONLY Blood Culture results may not be optimal due to an inadequate volume of blood received in culture bottles Performed at [place] [address], [place], [place] [address]    Culture   Final    NO GROWTH 5 DAYS Performed at [facility], [place] [address] [place], [place] [address]    Report Status 08/15/2020 FINAL  Final  Resp Panel by RT-PCR (Flu A&B, Covid) Nasopharyngeal Swab     Status: None   Collection Time: 08/10/20  7:42 AM   Specimen: Nasopharyngeal Swab; Nasopharyngeal(NP) swabs in vial transport medium  Result Value Ref Range Status   SARS Coronavirus 2 by RT PCR NEGATIVE NEGATIVE  Final    Comment: (NOTE) SARS-CoV-2 target nucleic acids are NOT DETECTED.  The SARS-CoV-2 RNA is generally detectable in upper respiratory specimens  during the acute phase of infection. The lowest concentration of SARS-CoV-2 viral copies this assay can detect is 138 copies/mL. A negative result does not preclude SARS-Cov-2 infection and should not be used as the sole basis for treatment or other patient management decisions. A negative result may occur with  improper specimen collection/handling, submission of specimen other than nasopharyngeal swab, presence of viral mutation(s) within the areas targeted by this assay, and inadequate number of viral copies(<138 copies/mL). A negative result must be combined with clinical observations, patient history, and epidemiological information. The expected result is Negative.  Fact Sheet for Patients:  EntrepreneurPulse.com.au  Fact Sheet for Healthcare Providers:  IncredibleEmployment.be  This test is no t yet approved or cleared by the [place] FDA and  has been authorized for detection and/or diagnosis of SARS-CoV-2 by FDA under an Emergency Use Authorization (EUA). This EUA will remain  in effect (meaning this test can be used) for the duration of the COVID-19 declaration under Section 564(b)(1) of the Act, 21 U.S.C.section 360bbb-3(b)(1), unless the authorization is terminated  or revoked sooner.       Influenza A by PCR NEGATIVE NEGATIVE Final   Influenza B by PCR NEGATIVE NEGATIVE Final    Comment: (NOTE) The Xpert Xpress SARS-CoV-2/FLU/RSV plus assay is intended as an aid in the diagnosis of influenza from Nasopharyngeal swab specimens and should not be used as a sole basis for treatment. Nasal washings and aspirates are unacceptable for Xpert Xpress SARS-CoV-2/FLU/RSV testing.  Fact Sheet for Patients: EntrepreneurPulse.com.au  Fact Sheet for Healthcare Providers: IncredibleEmployment.be  This test is not yet approved or cleared by the [place] FDA and has been authorized for detection  and/or diagnosis of SARS-CoV-2 by FDA under an Emergency Use Authorization (EUA). This EUA will remain in effect (meaning this test can be used) for the duration of the COVID-19 declaration under Section 564(b)(1) of the Act, 21 U.S.C. section 360bbb-3(b)(1), unless the authorization is terminated or revoked.  Performed at [facility], [place] [address], [place], [place] [address]   Culture, Urine     Status: Abnormal   Collection Time: 08/11/20  9:37 AM   Specimen: Urine, Catheterized  Result Value Ref Range Status   Specimen Description   Final    URINE, CATHETERIZED Performed at [place] [address], [place], [place] [address]    Special Requests   Final    Normal Performed at [facility], [place] [address], [place], [place] [address]    Culture >=100,000 COLONIES/mL PROTEUS MIRABILIS (A)  Final   Report Status 08/13/2020 FINAL  Final   Organism ID, Bacteria PROTEUS MIRABILIS (A)  Final      Susceptibility   Proteus mirabilis - MIC*    AMPICILLIN <=2 SENSITIVE Sensitive     CEFAZOLIN <=4 SENSITIVE Sensitive     CEFEPIME <=0.12 SENSITIVE Sensitive     CEFTRIAXONE <=0.25 SENSITIVE Sensitive     CIPROFLOXACIN <=0.25 SENSITIVE Sensitive     GENTAMICIN 4 SENSITIVE Sensitive     IMIPENEM 2 SENSITIVE Sensitive     NITROFURANTOIN RESISTANT Resistant     TRIMETH/SULFA <=20 SENSITIVE Sensitive     AMPICILLIN/SULBACTAM <=2 SENSITIVE Sensitive     PIP/TAZO <=4 SENSITIVE Sensitive     * >=100,000 COLONIES/mL PROTEUS MIRABILIS  Aerobic Culture w Gram Stain (superficial specimen)  Status: None   Collection Time: 08/12/20 12:46 PM   Specimen: Wound  Result Value Ref Range Status   Specimen Description WOUND  Final   Special Requests NONE  Final   Gram Stain   Final    RARE WBC PRESENT, PREDOMINANTLY MONONUCLEAR NO ORGANISMS SEEN    Culture RARE ESCHERICHIA COLI FEW ENTEROCOCCUS FAECALIS   Final   Report Status  08/16/2020 FINAL  Final   Organism ID, Bacteria ESCHERICHIA COLI  Final   Organism ID, Bacteria ENTEROCOCCUS FAECALIS  Final      Susceptibility   Escherichia coli - MIC*    AMPICILLIN <=2 SENSITIVE Sensitive     CEFAZOLIN <=4 SENSITIVE Sensitive     CEFEPIME <=0.12 SENSITIVE Sensitive     CEFTAZIDIME <=1 SENSITIVE Sensitive     CEFTRIAXONE <=0.25 SENSITIVE Sensitive     CIPROFLOXACIN <=0.25 SENSITIVE Sensitive     GENTAMICIN <=1 SENSITIVE Sensitive     IMIPENEM <=0.25 SENSITIVE Sensitive     TRIMETH/SULFA <=20 SENSITIVE Sensitive     AMPICILLIN/SULBACTAM <=2 SENSITIVE Sensitive     PIP/TAZO <=4 SENSITIVE Sensitive     * RARE ESCHERICHIA COLI   Enterococcus faecalis - MIC*    AMPICILLIN <=2 SENSITIVE Sensitive     LEVOFLOXACIN 1 SENSITIVE Sensitive     VANCOMYCIN 1 SENSITIVE Sensitive     GENTAMICIN SYNERGY Value in next row Sensitive      SENSITIVEPerformed at [facility], [address] [place], [place] [address]    * FEW ENTEROCOCCUS FAECALIS  Resp Panel by RT-PCR (Flu A&B, Covid) Nasopharyngeal Swab     Status: None   Collection Time: 08/18/20  7:36 PM   Specimen: Nasopharyngeal Swab; Nasopharyngeal(NP) swabs in vial transport medium  Result Value Ref Range Status   SARS Coronavirus 2 by RT PCR NEGATIVE NEGATIVE Final    Comment: (NOTE) SARS-CoV-2 target nucleic acids are NOT DETECTED.  The SARS-CoV-2 RNA is generally detectable in upper respiratory specimens during the acute phase of infection. The lowest concentration of SARS-CoV-2 viral copies this assay can detect is 138 copies/mL. A negative result does not preclude SARS-Cov-2 infection and should not be used as the sole basis for treatment or other patient management decisions. A negative result may occur with  improper specimen collection/handling, submission of specimen other than nasopharyngeal swab, presence of viral mutation(s) within the areas targeted by this assay, and inadequate number of  viral copies(<138 copies/mL). A negative result must be combined with clinical observations, patient history, and epidemiological information. The expected result is Negative.  Fact Sheet for Patients:  EntrepreneurPulse.com.au  Fact Sheet for Healthcare Providers:  IncredibleEmployment.be  This test is no t yet approved or cleared by the [place] FDA and  has been authorized for detection and/or diagnosis of SARS-CoV-2 by FDA under an Emergency Use Authorization (EUA). This EUA will remain  in effect (meaning this test can be used) for the duration of the COVID-19 declaration under Section 564(b)(1) of the Act, 21 U.S.C.section 360bbb-3(b)(1), unless the authorization is terminated  or revoked sooner.       Influenza A by PCR NEGATIVE NEGATIVE Final   Influenza B by PCR NEGATIVE NEGATIVE Final    Comment: (NOTE) The Xpert Xpress SARS-CoV-2/FLU/RSV plus assay is intended as an aid in the diagnosis of influenza from Nasopharyngeal swab specimens and should not be used as a sole basis for treatment. Nasal washings and aspirates are unacceptable for Xpert Xpress SARS-CoV-2/FLU/RSV testing.  Fact Sheet for Patients: EntrepreneurPulse.com.au  Fact Sheet for Healthcare Providers: IncredibleEmployment.be  This test is not yet approved or cleared by the [place] FDA and has been authorized for detection and/or diagnosis of SARS-CoV-2 by FDA under an Emergency Use Authorization (EUA). This EUA will remain in effect (meaning this test can be used) for the duration of the COVID-19 declaration under Section 564(b)(1) of the Act, 21 U.S.C. section 360bbb-3(b)(1), unless the authorization is terminated or revoked.  Performed at [facility], [place] [address], [place], [place] [address]          Radiology Studies: CT Head Wo Contrast  Result Date: 08/18/2020 CLINICAL DATA:  Fall at 2 tell  today due to weakness striking head. Discharged today for treatment of abscess and wound care. EXAM: CT HEAD WITHOUT CONTRAST TECHNIQUE: Contiguous axial images were obtained from the base of the skull through the vertex without intravenous contrast. COMPARISON:  None. FINDINGS: Brain: Generalized atrophy is advanced for age. No hemorrhage. No evidence of acute ischemia. No hydrocephalus. No midline shift or mass effect. Prominence of subdural and subarachnoid spaces at the convexities felt to be did atrophy. Vascular: No hyperdense vessel. Skull: No fracture or focal lesion. Sinuses/Orbits: Opacification of left side of sphenoid sinus. Scattered mucous retention cysts in the maxillary sinuses. Opacification of right greater than left mastoid air cells. No acute orbital findings. Other: None. IMPRESSION: 1. No acute intracranial abnormality. No skull fracture. 2. Generalized atrophy, advanced for age. Electronically Signed   By: Keith Rake M.D.   On: 08/18/2020 19:52   CT Cervical Spine Wo Contrast  Result Date: 08/18/2020 CLINICAL DATA:  Fall at home tail today due to weakness striking head. EXAM: CT CERVICAL SPINE WITHOUT CONTRAST TECHNIQUE: Multidetector CT imaging of the cervical spine was performed without intravenous contrast. Multiplanar CT image reconstructions were also generated. COMPARISON:  None. FINDINGS: Assessment from C4-C5 through the cervicothoracic junction is extremely limited and near nondiagnostic due to attenuation from habitus. Alignment: Mild scoliotic curvature of the lower cervical spine. No gross listhesis or traumatic subluxation. Skull base and vertebrae: No evidence of fracture. Non fusion posterior and anterior arch of C1, variant anatomy. Lower cervical spine is not well assessed. Soft tissues and spinal canal: No evidence of canal hematoma. No obvious prevertebral soft tissue edema, limited assessment. Disc levels: Suspected disc space narrowing at C5-C6 and C6-C7, not  well assessed. Upper chest: Assessed on concurrent chest CT, reported separately. Other: Morbid obesity. IMPRESSION: 1. No evidence of acute fracture or subluxation of the cervical spine. 2. Assessment from C4-C5 through the cervicothoracic junction is extremely limited and near nondiagnostic due to attenuation from habitus. Electronically Signed   By: Keith Rake M.D.   On: 08/18/2020 19:54   CT L-SPINE NO CHARGE  Result Date: 08/18/2020 CLINICAL DATA:  Initial evaluation for acute trauma, fall. EXAM: CT LUMBAR SPINE WITHOUT CONTRAST TECHNIQUE: Multidetector CT imaging of the lumbar spine was performed without intravenous contrast administration. Multiplanar CT image reconstructions were also generated. COMPARISON:  Prior MRI from 11/04/2018. FINDINGS: Segmentation: Examination markedly limited by motion artifact and body habitus, with poor evaluation particularly at the lumbosacral junction. Standard segmentation. Lowest well-formed disc space labeled the L5-S1 level. Alignment: Exaggeration of the normal lumbar lordosis. No interval listhesis or malalignment. Vertebrae: Vertebral body heights grossly maintained. There is question of an irregular lucency at the level of the lumbosacral junction (series 4, image 53). While this finding may simply be artifactual nature, a possible fracture is difficult to exclude on this technically  limited exam. No other visible acute fracture. Visualized sacrum and pelvis otherwise intact. No discrete osseous lesions. Paraspinal and other soft tissues: No visible paraspinous soft tissue abnormality. Secreted contrast material present within the renal collecting systems bilaterally. Disc levels: Moderate multilevel degenerative spondylosis is similar to previous MRI. Advanced facet arthropathy present within the lower lumbar spine. No obvious high-grade spinal stenosis. IMPRESSION: 1. Markedly limited exam due to motion artifact and body habitus. Apparent irregular lucency  at the level of the lumbosacral junction could simply be artifactual nature, although a possible fracture is difficult to exclude on this technically limited exam. Correlation with physical exam for possible pain at this location recommended. Further assessment with dedicated MRI would be recommended as clinically warranted. 2. No other acute traumatic injury within the lumbar spine. 3. Moderate to advanced multilevel degenerative spondylosis and facet arthropathy, grossly similar to previous MRI. Electronically Signed   By: Jeannine Boga M.D.   On: 08/18/2020 19:59   DG Chest Port 1 View  Result Date: 08/18/2020 CLINICAL DATA:  Chest pain EXAM: PORTABLE CHEST 1 VIEW COMPARISON:  Aug 10, 2020 FINDINGS: Lungs are clear. Heart size and pulmonary vascularity are normal. No adenopathy. No pneumothorax. No bone lesions. IMPRESSION: Lungs clear.  Cardiac silhouette normal. Electronically Signed   By: Lowella Grip III M.D.   On: 08/18/2020 18:12   CT CHEST ABDOMEN PELVIS WO CONTRAST  Result Date: 08/18/2020 CLINICAL DATA:  Fall today after recent discharge from hospital for treatment pannicular abscess and cellulitis. History of chronic renal failure. EXAM: CT CHEST, ABDOMEN AND PELVIS WITHOUT CONTRAST TECHNIQUE: Multidetector CT imaging of the chest, abdomen and pelvis was performed following the standard protocol without IV contrast. COMPARISON:  Radiographs from Aug 10, 2020 and renal ultrasound Aug 14, 2020. FINDINGS: CT CHEST FINDINGS Cardiovascular: No significant vascular findings. Normal heart size. No pericardial effusion. Mediastinum/Nodes: No enlarged mediastinal, hilar, or axillary lymph nodes. Thyroid gland, trachea, and esophagus demonstrate no significant findings. Lungs/Pleura: Lungs are clear. No pleural effusion or pneumothorax. Musculoskeletal: Mild levoconvex curvature of the upper thoracic spine. No acute osseous abnormality visualized. There is significant soft tissue attenuation of  photons secondary to patient habitus on today's exam and could not be eliminated. This reduces exam sensitivity and specificity. Within this limitation: CT ABDOMEN PELVIS FINDINGS Hepatobiliary: Unremarkable noncontrast appearance of the hepatic parenchyma. Gallbladder surgically absent. Pancreas: No acute abnormality within the limitations of the exam. Spleen: No acute abnormality within the limitations of the exam. Adrenals/Urinary Tract: Hypodense 1.7 cm right adrenal nodule, likely an adenoma. Left adrenal glands unremarkable. No hydronephrosis. Heterogeneous 7.8 x 7.6 cm lesion in the upper pole of the right kidney corresponding with the mass seen on prior ultrasound. Bilateral nonobstructive renal stones. Urinary bladder incompletely distended limiting evaluation. Stomach/Bowel: No acute abnormality within the limitations of the exam Vascular/Lymphatic: No acute finding within limitations of the exam. Reproductive: No acute abnormality within limitations of the exam. Other: No abdominal ascites visualized. Musculoskeletal: There is multifocal cortical irregularities involving the lumbar spine for reference on image 112/13, however artifact obscures the lumbar vertebral bodies limiting evaluation. IMPRESSION: 1. No definite evidence of acute traumatic injury to the chest, abdomen, or pelvis within the limitations of this noncontrast examination which is degraded by significant soft tissue photon attenuation. Of note there are multifocal cortical irregularities involving the lumbar spine which is felt to be secondary to artifact, would recommend correlation with point tenderness and if concern for injury lumbar spine radiographs. 2. Heterogeneous 7.8 cm right  upper pole renal lesion corresponding with the mass seen on prior ultrasound, and incompletely characterized on this examination. Recommend nonemergent renal MRI with and without contrast for further evaluation. 3. Hypodense 1.7 cm right adrenal nodule,  likely benign adrenal adenoma. Electronically Signed   By: Dahlia Bailiff MD   On: 08/18/2020 20:19      Scheduled Meds: . buPROPion  300 mg Oral Daily  . busPIRone  10 mg Oral BID  . Chlorhexidine Gluconate Cloth  6 each Topical Daily  . [START ON 08/20/2020] diphenhydrAMINE  50 mg Intravenous Q24H  . DULoxetine  60 mg Oral Daily  . enoxaparin (LOVENOX) injection  100 mg Subcutaneous Q24H  . fluticasone  1 spray Each Nare Daily  . montelukast  10 mg Oral QHS  . QUEtiapine  25 mg Oral QHS  . rOPINIRole  3 mg Oral QHS  . silver sulfADIAZINE   Topical Daily  . vitamin B-12  5,000 mcg Oral Daily   Continuous Infusions: . cefTRIAXone (ROCEPHIN)  IV Stopped (08/18/20 1909)  . lactated ringers Stopped (08/19/20 0438)  . [START ON 08/20/2020] vancomycin       LOS: 0 days      Debbe Odea, MD [facility] Pager: [email or web address] 08/19/2020, 11:48 AM

## 2020-08-20 DIAGNOSIS — T148XXA Other injury of unspecified body region, initial encounter: Secondary | ICD-10-CM

## 2020-08-20 LAB — URINE CULTURE: Culture: NO GROWTH

## 2020-08-20 MED ORDER — FOLIC ACID 1 MG PO TABS
1.0000 mg | ORAL_TABLET | Freq: Every day | ORAL | Status: DC
  Administered 2020-08-20 – 2020-08-24 (×5): 1 mg via ORAL
  Filled 2020-08-20 (×5): qty 1

## 2020-08-20 MED ORDER — SILVER SULFADIAZINE 1 % EX CREA
TOPICAL_CREAM | Freq: Two times a day (BID) | CUTANEOUS | Status: DC
  Administered 2020-08-22: 1 via TOPICAL
  Filled 2020-08-20 (×2): qty 85

## 2020-08-20 NOTE — Progress Notes (Signed)
Patient refused CPAP at this time. Patient states she does not currently wear CPAP HS. Patient verbalizes understanding to let Respiratory know if she desires to use CPAP during her hospital stay.

## 2020-08-20 NOTE — Consult Note (Signed)
[facility] Nurse Consult Note: Reason for Consult: Third consult in 10 days for this patient with moisture associated wounds in the presence of friction and incontinence. Wound type: Intertriginous MASD plus friction plus contamination   ICD-10 CM Codes for Irritant Dermatitis: L24A0 - Due to friction or contact with body fluids; unspecified L24A2 - Due to fecal, urinary or dual incontinence L30.4  - Erythema intertrigo. Also used for abrasion of the hand, chafing of the skin, dermatitis due to sweating and friction, friction dermatitis, friction eczema, and genital/thigh intertrigo.  Pressure Injury POA: NA Measurement: See photos taken an uploaded to EMR on 08/10/20 Wound VZS:MOLMBEML are pink, moist or recently reepithelialized, two or three lesions have fibrinous yellow material in wound bed (resulting from inflammation) Drainage (amount, consistency, odor) small serous to light yellow with musty odor Periwound: intact with scarring from previously healed full thickness lesions Dressing procedure/placement/frequency: I have increased the application to silver sulfadiazine to twice daily and continued the use of the house antimicrobial wicking textile (InterDry). Complete bathing of this patient at lease daily is an important element of the plan of care for this patient including but not limited to the removal of all dressings as two colleagues of mine have already indicated in their consultations on 08/10/20 and 08/14/20.  [facility] nursing team will not follow, but will remain available to this patient, the nursing and medical teams.  Please re-consult if needed. Thanks, Maudie Flakes, MSN, RN, [place], [facility]  Pager# [phone]

## 2020-08-20 NOTE — Evaluation (Signed)
Physical Therapy Evaluation Patient Details Name: Heather [name] MRN: [id number] DOB: 06-Jan-1976 Today's Date: 08/20/2020   History of Present Illness  45 y.o. female admitted to ED for hypotension with fall with concerning features for possible developing spesis. Head Chest abdomen pelvis CT with no acute findings. Of note pt with recent hospitalization for UTI and multiple wounds with/ pancolitis found to have renal faliure and rhabdomyolsis. PMH includes morbid obesity, obstructive sleep apnea, COPD, anxiety and depression, diabetes mellitus type 2, bipolar disorder, renal mass, restless leg syndrome and numerous small wounds under her pannus, upper thighs and between her legs.  Clinical Impression  Pt presents to PT with deficits in functional mobility, strength, power, balance, endurance, gait. Pt is generally weak and requires physical assistance to perform all out of bed mobility at this time. Due to pt's weakness she remains at a high risk of falls and will benefit from continued acute PT services to improve strength, balance, and activity tolerance. At baseline the pt reports ambulating short household distances independently. Pt demonstrates the potential to return to a household ambulator level with aggressive therapies. PT recommends CIR placement to aide in a return to baseline and reduce falls risk. Pt will benefit from a low air loss mattress during this admission in an effort to reduce risk of pressure injuries and progression of her current wounds.    Follow Up Recommendations CIR    Equipment Recommendations  Wheelchair (measurements PT);Wheelchair cushion (measurements PT) (bariatric wheelchair if D/C home today)    Recommendations for Other Services Rehab consult     Precautions / Restrictions Precautions Precautions: Fall Precaution Comments: multiple wounds in skin folds Restrictions Weight Bearing Restrictions: No      Mobility  Bed Mobility Overal bed  mobility: Needs Assistance Bed Mobility: Supine to Sit;Sit to Supine     Supine to sit: Min guard (use of bed rails) Sit to supine: Mod assist        Transfers Overall transfer level: Needs assistance Equipment used: 2 person hand held assist Transfers: Sit to/from Stand Sit to Stand: Min assist;+2 physical assistance;From elevated surface         General transfer comment: cues for hand placement, bilateral knee blocks  Ambulation/Gait Ambulation/Gait assistance: Min assist (pt takes 2 steps forward and backard to/from bed) Gait Distance (Feet): 1 Feet Assistive device:  (bariatric RW) Gait Pattern/deviations: Step-to pattern;Wide base of support Gait velocity: decreased Gait velocity interpretation: <1.31 ft/sec, indicative of household ambulator General Gait Details: short steps with reduced foot clearance bilaterally  Stairs            Wheelchair Mobility    Modified Rankin (Stroke Patients Only)       Balance Overall balance assessment: Needs assistance;History of Falls Sitting-balance support: No upper extremity supported Sitting balance-Leahy Scale: Fair     Standing balance support: Bilateral upper extremity supported Standing balance-Leahy Scale: Poor Standing balance comment: reliant on UE support and minA                             Pertinent Vitals/Pain Pain Assessment: Faces Faces Pain Scale: Hurts a little bit Pain Location: generalized Pain Descriptors / Indicators: Grimacing Pain Intervention(s): Monitored during session    Home Living Family/patient expects to be discharged to:: Other (Comment) (motel, reports recent eviction from apartment) Living Arrangements: Parent;Other (Comment) (62 45 year old daughters) Available Help at Discharge: Family;Available 24 hours/day Type of Home: Other(Comment) (motel) Home Access:  Level entry     Home Layout: One level Home Equipment: Bedside commode;Walker - 2 wheels      Prior  Function Level of Independence: Needs assistance   Gait / Transfers Assistance Needed: pt reports ambulating short household distances at baseline  ADL's / Homemaking Assistance Needed: pt requires assistance for dressing        Hand Dominance   Dominant Hand: Left    Extremity/Trunk Assessment   Upper Extremity Assessment Upper Extremity Assessment: Overall WFL for tasks assessed    Lower Extremity Assessment Lower Extremity Assessment: Generalized weakness (RLE weakness more significant than LLE)    Cervical / Trunk Assessment Cervical / Trunk Assessment: Other exceptions Cervical / Trunk Exceptions: morbid obesity  Communication   Communication: No difficulties  Cognition Arousal/Alertness: Awake/alert Behavior During Therapy: WFL for tasks assessed/performed Overall Cognitive Status: Within Functional Limits for tasks assessed                                        General Comments General comments (skin integrity, edema, etc.): multiple wounds in skin folds and groin area    Exercises     Assessment/Plan    PT Assessment Patient needs continued PT services  PT Problem List Decreased strength;Decreased activity tolerance;Decreased balance;Decreased mobility;Decreased knowledge of use of DME;Decreased safety awareness;Decreased knowledge of precautions;Pain       PT Treatment Interventions DME instruction;Gait training;Functional mobility training;Therapeutic activities;Therapeutic exercise;Balance training;Neuromuscular re-education;Patient/family education    PT Goals (Current goals can be found in the Care Plan section)  Acute Rehab PT Goals Patient Stated Goal: to go to rehab and return to prior level of function, ambulating independently around the home PT Goal Formulation: With patient Time For Goal Achievement: 09/03/20 Potential to Achieve Goals: Good    Frequency Min 3X/week   Barriers to discharge        Co-evaluation                AM-PAC PT "6 Clicks" Mobility  Outcome Measure Help needed turning from your back to your side while in a flat bed without using bedrails?: A Lot Help needed moving from lying on your back to sitting on the side of a flat bed without using bedrails?: A Little Help needed moving to and from a bed to a chair (including a wheelchair)?: A Lot Help needed standing up from a chair using your arms (e.g., wheelchair or bedside chair)?: A Lot Help needed to walk in hospital room?: A Lot Help needed climbing 3-5 steps with a railing? : Total 6 Click Score: 12    End of Session   Activity Tolerance: Patient limited by fatigue;Other (comment) (some anxiety) Patient left: in bed;with call bell/phone within reach Nurse Communication: Mobility status;Need for lift equipment PT Visit Diagnosis: Difficulty in walking, not elsewhere classified (R26.2);Muscle weakness (generalized) (M62.81)    Time: 7517-0017 PT Time Calculation (min) (ACUTE ONLY): 35 min   Charges:   PT Evaluation $PT Eval Low Complexity: 1 Low PT Treatments $Therapeutic Activity: 8-22 mins        Zenaida Niece, PT, DPT Acute Rehabilitation Pager: [phone]   Zenaida Niece 08/20/2020, 12:55 PM

## 2020-08-20 NOTE — Progress Notes (Addendum)
PROGRESS NOTE    Heather [name]   WJX:[id number]  DOB: 24-Sep-1975  DOA: 08/18/2020 PCP: Lucianne Lei, MD   Brief Narrative:  Heather [name] is a 45 year old female with morbid obesity, obstructive sleep apnea, COPD, MDD, GAD, insomnia and RLS, diabetes mellitus type 2,renal mass,  and numerous small wounds under her pannus, upper thighs and between her legs. The patient was discharged from [facility] on 5/13 where she was admitted since 5/4. She was treated for a complicated Proteus mirabilis UTI, AKI which improved with IV fluids, traumatic rhabdomyolysis secondary to a fall.  It was recommended that she be transferred to [facility] inpatient rehab however, she decided that she would rather go back to her motel. While walking in the motel on her way back home yesterday, she suddenly felt weak, her right leg gave out and she fell to the floor.  She was brought back to the hospital and noted to be hypotensive and tachycardic.    Blood pressure was about 81/49, heart rate in low 100s. Her creatinine was 2.00, increased from 1.66 when last checked on 5/11. WBC or mildly elevated at 12.4, increased from 7.2 when last checked on 5/9. Lactic acid was 2.6. UA revealed large leukocytes, negative nitrite, rare bacteria, greater than 50 RBCs and greater than 50 WBCs. She was given 1600 cc normal saline bolus and a gram of Rocephin in the ED.  Subjective: She has no complaints today.    Assessment & Plan:   Principal Problem:   SIRS (systemic inflammatory response syndrome) vs dehydration    Lactic acidosis -She is currently receiving vancomycin, ceftriaxone and doxycycline -Recent Proteus mirabilis urinary tract infection was treated for 5 days with appropriate antibiotics - Panniculitis was treated with vancomycin and then Zyvox for 7 days total - Both antibiotics was stopped on 5/11 -  Blood and urine cultures both negative to date- I will dc Antibiotics and monitor for signs of  infection   Active Problems:  CKD stage III - Her creatinine had improved to about 1.66 on 5/11 -Today is 1.77  Right renal mass - 8.4 cm right renal mass noted on renal ultrasound -Previously noted to be around 6 cm & multiseptated on a lumbar MRI in 7/20 - Finding of mass was discussed with urology on her last admission- it was recommended that she have an outpatient work-up - The patient states that she was scheduled to see a urologist on 08/16/2020  Morbid obesity - Body mass index is 82.79 kg/m.   Moisture wounds on abdomen, thighs, upper legs - the following order placed based on last wound care eval > Per WOC: "Continue antimicrobial wicking fabric for coverage of yeast and moisture management.Add silvadene for topical care to open lesions.Make sure that she is bathed and all products removed completely, allowed to dry and reapplied."  Normocytic Anemia, folic acid deficiency- likely AOCD as well - Hemoglobin has been in the 7-8 range- -Anemia panel obtained on 5/5 revealed a folic acid that was 4.2, ferritin was normal and iron binding capacity was decreased- B12 was elevated - have started oral folate replacement  MDD - cont Wellbutrin, Cymbalta  GAD - Buspar  Insomnia -  Seroquel at bedtime  RLS - cont Requip  Time spent in minutes: 35 DVT prophylaxis:  Lovenox Code Status: full code Family Communication:  Level of Care: Level of care: Progressive- transfer to med/surg Disposition Plan:  Status is: Inpatient  The patient will require care spanning > 2 midnights and  should be moved to inpatient because: IV treatments appropriate due to intensity of illness or inability to take PO  Dispo: The patient is from: Home              Anticipated d/c is to: CIR              Patient currently is not medically stable to d/c.   Difficult to place patient Yes       Consultants:   none Procedures:   none Antimicrobials:  Anti-infectives (From admission,  onward)   Start     Dose/Rate Route Frequency Ordered Stop   08/20/20 0600  vancomycin (VANCOREADY) IVPB 1750 mg/350 mL        1,750 mg 87.5 mL/hr over 240 Minutes Intravenous Every 24 hours 08/19/20 0345     08/19/20 0430  doxycycline (VIBRAMYCIN) 100 mg in sodium chloride 0.9 % 250 mL IVPB  Status:  Discontinued        100 mg 125 mL/hr over 120 Minutes Intravenous Every 12 hours 08/19/20 0335 08/19/20 0337   08/19/20 0430  vancomycin (VANCOCIN) 2,500 mg in sodium chloride 0.9 % 500 mL IVPB        2,500 mg 125 mL/hr over 240 Minutes Intravenous NOW 08/19/20 0341 08/19/20 0838   08/18/20 1730  cefTRIAXone (ROCEPHIN) 1 g in sodium chloride 0.9 % 100 mL IVPB        1 g 200 mL/hr over 30 Minutes Intravenous Every 24 hours 08/18/20 1715         Objective: Vitals:   08/19/20 1935 08/19/20 2318 08/20/20 0318 08/20/20 0735  BP: 118/72 124/70 (!) 149/33 135/68  Pulse: 94 89 86 95  Resp: 18 16 13 19   Temp: 98.4 F (36.9 C) 98.4 F (36.9 C) 98.4 F (36.9 C) 98.1 F (36.7 C)  TempSrc: Oral Oral Oral Oral  SpO2: 98% 95% 92% 97%  Weight:      Height:        Intake/Output Summary (Last 24 hours) at 08/20/2020 1225 Last data filed at 08/20/2020 0601 Gross per 24 hour  Intake 280.68 ml  Output 2150 ml  Net -1869.32 ml   Filed Weights   08/18/20 2230  Weight: (!) 212 kg    Examination: General exam: Appears comfortable  HEENT: PERRLA, oral mucosa moist, no sclera icterus or thrush Respiratory system: Clear to auscultation. Respiratory effort normal. Cardiovascular system: S1 & S2 heard, regular rate and rhythm Gastrointestinal system: Abdomen soft, non-tender, obese, Normal bowel sounds   Central nervous system: Alert and oriented. No focal neurological deficits. Extremities: No cyanosis, clubbing or edema Skin: shallow ulcers under pannus, upper thigh and in between legs Psychiatry:  Mood & affect appropriate.    Data Reviewed: I have personally reviewed following labs and  imaging studies  CBC: Recent Labs  Lab 08/13/20 1445 08/14/20 1258 08/18/20 1936 08/18/20 2050 08/19/20 0208  WBC 9.4 7.2 12.4*  --  8.2  NEUTROABS  --  4.0 9.2*  --   --   HGB 7.8* 7.4* 9.2* 11.2* 7.8*  HCT 28.9* 28.2* 33.5* 33.0* 28.5*  MCV 81.4 81.7 80.0  --  79.2*  PLT 435* 401* 363  --  [id number]   Basic Metabolic Panel: Recent Labs  Lab 08/14/20 0331 08/15/20 0400 08/16/20 0407 08/18/20 1936 08/18/20 2050 08/19/20 0208  NA 142 140 141 136 140 136  K 3.4* 3.4* 4.5 3.7 3.7 3.4*  CL 112* 112* 113* 103 104 105  CO2 24 24 21* 21*  --  22  GLUCOSE 89 82 77 78 72 77  BUN 23* 17 14 11 12 11   CREATININE 2.42* 2.14* 1.66* 1.97* 2.00* 1.77*  CALCIUM 8.2* 8.2* 8.4* 8.8*  --  8.8*  MG  --  2.0 1.9  --   --   --    GFR: Estimated Creatinine Clearance: 76 mL/min (A) (by C-G formula based on SCr of 1.77 mg/dL (H)). Liver Function Tests: Recent Labs  Lab 08/18/20 1936 08/19/20 0208  AST 25 20  ALT 24 21  ALKPHOS 64 60  BILITOT 0.6 0.4  PROT 7.0 6.2*  ALBUMIN 2.3* 2.1*   No results for input(s): LIPASE, AMYLASE in the last 168 hours. No results for input(s): AMMONIA in the last 168 hours. Coagulation Profile: Recent Labs  Lab 08/18/20 1936  INR 1.1   Cardiac Enzymes: Recent Labs  Lab 08/18/20 1936  CKTOTAL 46   BNP (last 3 results) No results for input(s): PROBNP in the last 8760 hours. HbA1C: No results for input(s): HGBA1C in the last 72 hours. CBG: Recent Labs  Lab 08/17/20 1139 08/17/20 1706 08/17/20 2102 08/18/20 0805 08/18/20 1159  GLUCAP 90 104* 90 78 94   Lipid Profile: No results for input(s): CHOL, HDL, LDLCALC, TRIG, CHOLHDL, LDLDIRECT in the last 72 hours. Thyroid Function Tests: No results for input(s): TSH, T4TOTAL, FREET4, T3FREE, THYROIDAB in the last 72 hours. Anemia Panel: No results for input(s): VITAMINB12, FOLATE, FERRITIN, TIBC, IRON, RETICCTPCT in the last 72 hours. Urine analysis:    Component Value Date/Time   COLORURINE  YELLOW 08/18/2020 1936   APPEARANCEUR CLOUDY (A) 08/18/2020 1936   APPEARANCEUR Cloudy (A) 08/06/2016 1209   LABSPEC 1.012 08/18/2020 1936   PHURINE 5.0 08/18/2020 1936   GLUCOSEU NEGATIVE 08/18/2020 1936   HGBUR MODERATE (A) 08/18/2020 1936   HGBUR large 04/19/2008 0952   BILIRUBINUR NEGATIVE 08/18/2020 1936   BILIRUBINUR Negative 08/06/2016 [place] 08/18/2020 1936   PROTEINUR 100 (A) 08/18/2020 1936   UROBILINOGEN 0.2 12/06/2014 2049   NITRITE NEGATIVE 08/18/2020 1936   LEUKOCYTESUR LARGE (A) 08/18/2020 1936   Sepsis Labs: @LABRCNTIP (procalcitonin:4,lacticidven:4) ) Recent Results (from the past 240 hour(s))  Culture, Urine     Status: Abnormal   Collection Time: 08/11/20  9:37 AM   Specimen: Urine, Catheterized  Result Value Ref Range Status   Specimen Description   Final    URINE, CATHETERIZED Performed at [facility], [place] [address], [place], [place] [address]    Special Requests   Final    Normal Performed at [facility], [place] [address], [place], [place] [address]    Culture >=100,000 COLONIES/mL PROTEUS MIRABILIS (A)  Final   Report Status 08/13/2020 FINAL  Final   Organism ID, Bacteria PROTEUS MIRABILIS (A)  Final      Susceptibility   Proteus mirabilis - MIC*    AMPICILLIN <=2 SENSITIVE Sensitive     CEFAZOLIN <=4 SENSITIVE Sensitive     CEFEPIME <=0.12 SENSITIVE Sensitive     CEFTRIAXONE <=0.25 SENSITIVE Sensitive     CIPROFLOXACIN <=0.25 SENSITIVE Sensitive     GENTAMICIN 4 SENSITIVE Sensitive     IMIPENEM 2 SENSITIVE Sensitive     NITROFURANTOIN RESISTANT Resistant     TRIMETH/SULFA <=20 SENSITIVE Sensitive     AMPICILLIN/SULBACTAM <=2 SENSITIVE Sensitive     PIP/TAZO <=4 SENSITIVE Sensitive     * >=100,000 COLONIES/mL PROTEUS MIRABILIS  Aerobic Culture w Gram Stain (superficial specimen)     Status: None  Collection Time: 08/12/20 12:46 PM   Specimen: Wound  Result Value Ref Range Status    Specimen Description WOUND  Final   Special Requests NONE  Final   Gram Stain   Final    RARE WBC PRESENT, PREDOMINANTLY MONONUCLEAR NO ORGANISMS SEEN    Culture RARE ESCHERICHIA COLI FEW ENTEROCOCCUS FAECALIS   Final   Report Status 08/16/2020 FINAL  Final   Organism ID, Bacteria ESCHERICHIA COLI  Final   Organism ID, Bacteria ENTEROCOCCUS FAECALIS  Final      Susceptibility   Escherichia coli - MIC*    AMPICILLIN <=2 SENSITIVE Sensitive     CEFAZOLIN <=4 SENSITIVE Sensitive     CEFEPIME <=0.12 SENSITIVE Sensitive     CEFTAZIDIME <=1 SENSITIVE Sensitive     CEFTRIAXONE <=0.25 SENSITIVE Sensitive     CIPROFLOXACIN <=0.25 SENSITIVE Sensitive     GENTAMICIN <=1 SENSITIVE Sensitive     IMIPENEM <=0.25 SENSITIVE Sensitive     TRIMETH/SULFA <=20 SENSITIVE Sensitive     AMPICILLIN/SULBACTAM <=2 SENSITIVE Sensitive     PIP/TAZO <=4 SENSITIVE Sensitive     * RARE ESCHERICHIA COLI   Enterococcus faecalis - MIC*    AMPICILLIN <=2 SENSITIVE Sensitive     LEVOFLOXACIN 1 SENSITIVE Sensitive     VANCOMYCIN 1 SENSITIVE Sensitive     GENTAMICIN SYNERGY Value in next row Sensitive      SENSITIVEPerformed at [facility], [address] [place], [place] [id number]    * FEW ENTEROCOCCUS FAECALIS  Resp Panel by RT-PCR (Flu A&B, Covid) Nasopharyngeal Swab     Status: None   Collection Time: 08/18/20  7:36 PM   Specimen: Nasopharyngeal Swab; Nasopharyngeal(NP) swabs in vial transport medium  Result Value Ref Range Status   SARS Coronavirus 2 by RT PCR NEGATIVE NEGATIVE Final    Comment: (NOTE) SARS-CoV-2 target nucleic acids are NOT DETECTED.  The SARS-CoV-2 RNA is generally detectable in upper respiratory specimens during the acute phase of infection. The lowest concentration of SARS-CoV-2 viral copies this assay can detect is 138 copies/mL. A negative result does not preclude SARS-Cov-2 infection and should not be used as the sole basis for treatment or other patient management  decisions. A negative result may occur with  improper specimen collection/handling, submission of specimen other than nasopharyngeal swab, presence of viral mutation(s) within the areas targeted by this assay, and inadequate number of viral copies(<138 copies/mL). A negative result must be combined with clinical observations, patient history, and epidemiological information. The expected result is Negative.  Fact Sheet for Patients:  BloggerCourse.comhttps://www.fda.gov/media/152166/download  Fact Sheet for Healthcare Providers:  SeriousBroker.ithttps://www.fda.gov/media/152162/download  This test is no t yet approved or cleared by the [place] FDA and  has been authorized for detection and/or diagnosis of SARS-CoV-2 by FDA under an Emergency Use Authorization (EUA). This EUA will remain  in effect (meaning this test can be used) for the duration of the COVID-19 declaration under Section 564(b)(1) of the Act, 21 U.S.C.section 360bbb-3(b)(1), unless the authorization is terminated  or revoked sooner.       Influenza A by PCR NEGATIVE NEGATIVE Final   Influenza B by PCR NEGATIVE NEGATIVE Final    Comment: (NOTE) The Xpert Xpress SARS-CoV-2/FLU/RSV plus assay is intended as an aid in the diagnosis of influenza from Nasopharyngeal swab specimens and should not be used as a sole basis for treatment. Nasal washings and aspirates are unacceptable for Xpert Xpress SARS-CoV-2/FLU/RSV testing.  Fact Sheet for Patients: BloggerCourse.comhttps://www.fda.gov/media/152166/download  Fact Sheet for Healthcare  Providers: IncredibleEmployment.be  This test is not yet approved or cleared by the [place] and has been authorized for detection and/or diagnosis of SARS-CoV-2 by FDA under an Emergency Use Authorization (EUA). This EUA will remain in effect (meaning this test can be used) for the duration of the COVID-19 declaration under Section 564(b)(1) of the Act, 21 U.S.C. section 360bbb-3(b)(1), unless the  authorization is terminated or revoked.  Performed at [facility], [place] [address], [place], [place] [address]   Urine culture     Status: None   Collection Time: 08/18/20  7:36 PM   Specimen: In/Out Cath Urine  Result Value Ref Range Status   Specimen Description IN/OUT CATH URINE  Final   Special Requests NONE  Final   Culture   Final    NO GROWTH Performed at [facility], [place] [address] [place], [place] [address]    Report Status 08/20/2020 FINAL  Final  Blood Culture (routine x 2)     Status: None (Preliminary result)   Collection Time: 08/18/20  7:53 PM   Specimen: BLOOD  Result Value Ref Range Status   Specimen Description BLOOD RIGHT ARM  Final   Special Requests   Final    BOTTLES DRAWN AEROBIC AND ANAEROBIC Blood Culture results may not be optimal due to an inadequate volume of blood received in culture bottles   Culture   Final    NO GROWTH 2 DAYS Performed at [facility], [place] [address], [place], [place] [address]    Report Status PENDING  Incomplete  Blood Culture (routine x 2)     Status: None (Preliminary result)   Collection Time: 08/18/20  8:38 PM   Specimen: BLOOD RIGHT HAND  Result Value Ref Range Status   Specimen Description BLOOD RIGHT HAND  Final   Special Requests   Final    BOTTLES DRAWN AEROBIC AND ANAEROBIC Blood Culture results may not be optimal due to an inadequate volume of blood received in culture bottles   Culture   Final    NO GROWTH 2 DAYS Performed at [facility], [place] [address], [place], [place] [address]    Report Status PENDING  Incomplete         Radiology Studies: CT Head Wo Contrast  Result Date: 08/18/2020 CLINICAL DATA:  Fall at 2 tell today due to weakness striking head. Discharged today for treatment of abscess and wound care. EXAM: CT HEAD WITHOUT CONTRAST TECHNIQUE: Contiguous axial images were obtained from the base of the skull through the vertex without intravenous contrast. COMPARISON:   None. FINDINGS: Brain: Generalized atrophy is advanced for age. No hemorrhage. No evidence of acute ischemia. No hydrocephalus. No midline shift or mass effect. Prominence of subdural and subarachnoid spaces at the convexities felt to be did atrophy. Vascular: No hyperdense vessel. Skull: No fracture or focal lesion. Sinuses/Orbits: Opacification of left side of sphenoid sinus. Scattered mucous retention cysts in the maxillary sinuses. Opacification of right greater than left mastoid air cells. No acute orbital findings. Other: None. IMPRESSION: 1. No acute intracranial abnormality. No skull fracture. 2. Generalized atrophy, advanced for age. Electronically Signed   By: Keith Rake M.D.   On: 08/18/2020 19:52   CT Cervical Spine Wo Contrast  Result Date: 08/18/2020 CLINICAL DATA:  Fall at home tail today due to weakness striking head. EXAM: CT CERVICAL SPINE WITHOUT CONTRAST TECHNIQUE: Multidetector CT imaging of the cervical spine was performed without intravenous contrast. Multiplanar  CT image reconstructions were also generated. COMPARISON:  None. FINDINGS: Assessment from C4-C5 through the cervicothoracic junction is extremely limited and near nondiagnostic due to attenuation from habitus. Alignment: Mild scoliotic curvature of the lower cervical spine. No gross listhesis or traumatic subluxation. Skull base and vertebrae: No evidence of fracture. Non fusion posterior and anterior arch of C1, variant anatomy. Lower cervical spine is not well assessed. Soft tissues and spinal canal: No evidence of canal hematoma. No obvious prevertebral soft tissue edema, limited assessment. Disc levels: Suspected disc space narrowing at C5-C6 and C6-C7, not well assessed. Upper chest: Assessed on concurrent chest CT, reported separately. Other: Morbid obesity. IMPRESSION: 1. No evidence of acute fracture or subluxation of the cervical spine. 2. Assessment from C4-C5 through the cervicothoracic junction is extremely  limited and near nondiagnostic due to attenuation from habitus. Electronically Signed   By: Keith Rake M.D.   On: 08/18/2020 19:54   CT L-SPINE NO CHARGE  Result Date: 08/18/2020 CLINICAL DATA:  Initial evaluation for acute trauma, fall. EXAM: CT LUMBAR SPINE WITHOUT CONTRAST TECHNIQUE: Multidetector CT imaging of the lumbar spine was performed without intravenous contrast administration. Multiplanar CT image reconstructions were also generated. COMPARISON:  Prior MRI from 11/04/2018. FINDINGS: Segmentation: Examination markedly limited by motion artifact and body habitus, with poor evaluation particularly at the lumbosacral junction. Standard segmentation. Lowest well-formed disc space labeled the L5-S1 level. Alignment: Exaggeration of the normal lumbar lordosis. No interval listhesis or malalignment. Vertebrae: Vertebral body heights grossly maintained. There is question of an irregular lucency at the level of the lumbosacral junction (series 4, image 53). While this finding may simply be artifactual nature, a possible fracture is difficult to exclude on this technically limited exam. No other visible acute fracture. Visualized sacrum and pelvis otherwise intact. No discrete osseous lesions. Paraspinal and other soft tissues: No visible paraspinous soft tissue abnormality. Secreted contrast material present within the renal collecting systems bilaterally. Disc levels: Moderate multilevel degenerative spondylosis is similar to previous MRI. Advanced facet arthropathy present within the lower lumbar spine. No obvious high-grade spinal stenosis. IMPRESSION: 1. Markedly limited exam due to motion artifact and body habitus. Apparent irregular lucency at the level of the lumbosacral junction could simply be artifactual nature, although a possible fracture is difficult to exclude on this technically limited exam. Correlation with physical exam for possible pain at this location recommended. Further assessment  with dedicated MRI would be recommended as clinically warranted. 2. No other acute traumatic injury within the lumbar spine. 3. Moderate to advanced multilevel degenerative spondylosis and facet arthropathy, grossly similar to previous MRI. Electronically Signed   By: Jeannine Boga M.D.   On: 08/18/2020 19:59   DG Chest Port 1 View  Result Date: 08/18/2020 CLINICAL DATA:  Chest pain EXAM: PORTABLE CHEST 1 VIEW COMPARISON:  Aug 10, 2020 FINDINGS: Lungs are clear. Heart size and pulmonary vascularity are normal. No adenopathy. No pneumothorax. No bone lesions. IMPRESSION: Lungs clear.  Cardiac silhouette normal. Electronically Signed   By: Lowella Grip III M.D.   On: 08/18/2020 18:12   CT CHEST ABDOMEN PELVIS WO CONTRAST  Result Date: 08/18/2020 CLINICAL DATA:  Fall today after recent discharge from hospital for treatment pannicular abscess and cellulitis. History of chronic renal failure. EXAM: CT CHEST, ABDOMEN AND PELVIS WITHOUT CONTRAST TECHNIQUE: Multidetector CT imaging of the chest, abdomen and pelvis was performed following the standard protocol without IV contrast. COMPARISON:  Radiographs from Aug 10, 2020 and renal ultrasound Aug 14, 2020. FINDINGS: CT CHEST  FINDINGS Cardiovascular: No significant vascular findings. Normal heart size. No pericardial effusion. Mediastinum/Nodes: No enlarged mediastinal, hilar, or axillary lymph nodes. Thyroid gland, trachea, and esophagus demonstrate no significant findings. Lungs/Pleura: Lungs are clear. No pleural effusion or pneumothorax. Musculoskeletal: Mild levoconvex curvature of the upper thoracic spine. No acute osseous abnormality visualized. There is significant soft tissue attenuation of photons secondary to patient habitus on today's exam and could not be eliminated. This reduces exam sensitivity and specificity. Within this limitation: CT ABDOMEN PELVIS FINDINGS Hepatobiliary: Unremarkable noncontrast appearance of the hepatic parenchyma.  Gallbladder surgically absent. Pancreas: No acute abnormality within the limitations of the exam. Spleen: No acute abnormality within the limitations of the exam. Adrenals/Urinary Tract: Hypodense 1.7 cm right adrenal nodule, likely an adenoma. Left adrenal glands unremarkable. No hydronephrosis. Heterogeneous 7.8 x 7.6 cm lesion in the upper pole of the right kidney corresponding with the mass seen on prior ultrasound. Bilateral nonobstructive renal stones. Urinary bladder incompletely distended limiting evaluation. Stomach/Bowel: No acute abnormality within the limitations of the exam Vascular/Lymphatic: No acute finding within limitations of the exam. Reproductive: No acute abnormality within limitations of the exam. Other: No abdominal ascites visualized. Musculoskeletal: There is multifocal cortical irregularities involving the lumbar spine for reference on image 112/13, however artifact obscures the lumbar vertebral bodies limiting evaluation. IMPRESSION: 1. No definite evidence of acute traumatic injury to the chest, abdomen, or pelvis within the limitations of this noncontrast examination which is degraded by significant soft tissue photon attenuation. Of note there are multifocal cortical irregularities involving the lumbar spine which is felt to be secondary to artifact, would recommend correlation with point tenderness and if concern for injury lumbar spine radiographs. 2. Heterogeneous 7.8 cm right upper pole renal lesion corresponding with the mass seen on prior ultrasound, and incompletely characterized on this examination. Recommend nonemergent renal MRI with and without contrast for further evaluation. 3. Hypodense 1.7 cm right adrenal nodule, likely benign adrenal adenoma. Electronically Signed   By: Dahlia Bailiff MD   On: 08/18/2020 20:19      Scheduled Meds: . buPROPion  300 mg Oral Daily  . busPIRone  10 mg Oral BID  . Chlorhexidine Gluconate Cloth  6 each Topical Daily  .  diphenhydrAMINE  50 mg Intravenous Q24H  . DULoxetine  60 mg Oral Daily  . enoxaparin (LOVENOX) injection  100 mg Subcutaneous Q24H  . fluticasone  1 spray Each Nare Daily  . montelukast  10 mg Oral QHS  . QUEtiapine  25 mg Oral QHS  . rOPINIRole  3 mg Oral QHS  . silver sulfADIAZINE   Topical BID  . vitamin B-12  5,000 mcg Oral Daily   Continuous Infusions: . cefTRIAXone (ROCEPHIN)  IV Stopped (08/19/20 1746)  . vancomycin 1,750 mg (08/20/20 0601)     LOS: 1 day      Debbe Odea, MD [facility] Pager: [email or web address] 08/20/2020, 12:25 PM

## 2020-08-20 NOTE — Progress Notes (Signed)
Inpatient Rehab Admissions:  Inpatient Rehab Consult received.  I met with patient at the bedside for rehabilitation assessment and to discuss goals and expectations of an inpatient rehab admission.  Pt acknowledged understanding of CIR goals and expectations. Pt interested in pursuing CIR.  Pt noted her mother would be able to provide supervision and only limited physical assistance. Pt also informed AC that she currently lives in a motel.  AC informed pt that she would consult with physiatrist regarding rehab potential and disposition to help determine how to proceed in discharge planning.   Signed: Gayland Curry, [place], [place] Admissions Coordinator [phone]

## 2020-08-21 LAB — CBC
HCT: 28.2 % — ABNORMAL LOW (ref 36.0–46.0)
Hemoglobin: 7.8 g/dL — ABNORMAL LOW (ref 12.0–15.0)
MCH: 22.1 pg — ABNORMAL LOW (ref 26.0–34.0)
MCHC: 27.7 g/dL — ABNORMAL LOW (ref 30.0–36.0)
MCV: 79.9 fL — ABNORMAL LOW (ref 80.0–100.0)
Platelets: 358 10*3/uL (ref 150–400)
RBC: 3.53 MIL/uL — ABNORMAL LOW (ref 3.87–5.11)
RDW: 24.6 % — ABNORMAL HIGH (ref 11.5–15.5)
WBC: 7.6 10*3/uL (ref 4.0–10.5)
nRBC: 0 % (ref 0.0–0.2)

## 2020-08-21 LAB — BASIC METABOLIC PANEL
Anion gap: 5 (ref 5–15)
BUN: 7 mg/dL (ref 6–20)
CO2: 26 mmol/L (ref 22–32)
Calcium: 8.8 mg/dL — ABNORMAL LOW (ref 8.9–10.3)
Chloride: 103 mmol/L (ref 98–111)
Creatinine, Ser: 1.43 mg/dL — ABNORMAL HIGH (ref 0.44–1.00)
GFR, Estimated: 47 mL/min — ABNORMAL LOW (ref >60)
Glucose, Bld: 79 mg/dL (ref 70–99)
Potassium: 3.5 mmol/L (ref 3.5–5.1)
Sodium: 134 mmol/L — ABNORMAL LOW (ref 135–145)

## 2020-08-21 NOTE — Evaluation (Signed)
Occupational Therapy Evaluation Patient Details Name: Heather [name] MRN: [id number] DOB: [birth year] Today's Date: 08/21/2020    History of Present Illness 45 y.o. female admitted to ED for hypotension with fall with concerning features for possible developing sepsis. Head Chest abdomen pelvis CT with no acute findings. Of note pt with recent hospitalization for UTI and multiple wounds with/ pancolitis found to have renal faliure and rhabdomyolsis. PMH includes morbid obesity, obstructive sleep apnea, COPD, anxiety and depression, diabetes mellitus type 2, bipolar disorder, renal mass, restless leg syndrome and numerous small wounds under her pannus, upper thighs and between her legs.   Clinical Impression   Pt declined CIR last hospitalization, returned home and fell, readmitted. Pt presents with generalized weakness and impaired standing balance. She requires set up to total assist for ADL. Pt demonstrated ability to transfer with +2 min assist, but declined remaining up in chair stating it was uncomfortable. Recommending rehab [name] pt may return home with her mom and 34 year old twins.     Follow Up Recommendations  CIR    Equipment Recommendations  Tub/shower bench (bariatric)    Recommendations for Other Services       Precautions / Restrictions Precautions Precautions: Fall Precaution Comments: multiple wounds in skin folds      Mobility Bed Mobility Overal bed mobility: Needs Assistance Bed Mobility: Supine to Sit;Sit to Supine     Supine to sit: Min assist;HOB elevated Sit to supine: Mod assist   General bed mobility comments: assist to raise trunk to EOB and for LEs back into bed    Transfers Overall transfer level: Needs assistance Equipment used: 2 person hand held assist Transfers: Sit to/from Stand;Stand Pivot Transfers Sit to Stand: Min assist;+2 physical assistance;From elevated surface Stand pivot transfers: Min assist;+2 physical assistance        General transfer comment: assist to rise and steady    Balance Overall balance assessment: Needs assistance;History of Falls Sitting-balance support: No upper extremity supported Sitting balance-Leahy Scale: Fair     Standing balance support: Bilateral upper extremity supported Standing balance-Leahy Scale: Poor                             ADL either performed or assessed with clinical judgement   ADL Overall ADL's : Needs assistance/impaired Eating/Feeding: Set up;Bed level   Grooming: Wash/dry hands;Wash/dry face;Sitting;Set up   Upper Body Bathing: Moderate assistance;Sitting   Lower Body Bathing: Total assistance;Sit to/from stand   Upper Body Dressing : Minimal assistance;Sitting   Lower Body Dressing: Total assistance;Bed level Lower Body Dressing Details (indicate cue type and reason): total A to don socks in bed Toilet Transfer: +2 for physical assistance;Minimal assistance;Stand-pivot;RW   Toileting- Clothing Manipulation and Hygiene: Total assistance;Sit to/from stand         General ADL Comments: pt declined OOB to chair due to discomfort in chair, requested bariatric BSC from unit secretary     Vision Baseline Vision/History: No visual deficits       Perception     Praxis Praxis Praxis tested?: Within functional limits    Pertinent Vitals/Pain Pain Assessment: Faces Faces Pain Scale: Hurts a little bit Pain Location: generalized Pain Descriptors / Indicators: Grimacing Pain Intervention(s): Monitored during session;Repositioned     Hand Dominance Left   Extremity/Trunk Assessment Upper Extremity Assessment Upper Extremity Assessment: Generalized weakness;Overall [facility] for tasks assessed   Lower Extremity Assessment Lower Extremity Assessment: Defer to PT evaluation   Cervical /  Trunk Assessment Cervical / Trunk Assessment: Other exceptions Cervical / Trunk Exceptions: morbid obesity   Communication  Communication Communication: No difficulties   Cognition Arousal/Alertness: Awake/alert Behavior During Therapy: WFL for tasks assessed/performed Overall Cognitive Status: Within Functional Limits for tasks assessed                                     General Comments       Exercises     Shoulder Instructions      Home Living Family/patient expects to be discharged to:: Other (Comment) (motel, left her apartment because of bed bugs) Living Arrangements: Parent;Children (29 year old twins) Available Help at Discharge: Family;Available 24 hours/day Type of Home: Other(Comment) (motel) Home Access: Level entry     Home Layout: One level     Bathroom Shower/Tub: Teacher, early years/pre: Standard     Home Equipment: Bedside commode;Walker - 2 wheels;Shower seat          Prior Functioning/Environment Level of Independence: Needs assistance  Gait / Transfers Assistance Needed: pt reports propping her foot on a stool to don socks, dependence in IADL, her mother drives ADL's / Homemaking Assistance Needed: reports being unable to stand for showering   Comments: 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, reports decline in function after epidural when having her twins 3 years ago        OT Problem List: Decreased strength;Decreased activity tolerance;Impaired balance (sitting and/or standing);Decreased safety awareness;Decreased knowledge of use of DME or AE;Obesity;Pain      OT Treatment/Interventions: Self-care/ADL training;DME and/or AE instruction;Therapeutic activities;Patient/family education;Balance training    OT Goals(Current goals can be found in the care plan section) Acute Rehab OT Goals Patient Stated Goal: to go to rehab and return to prior level of function, ambulating independently around the home OT Goal Formulation: With patient Time For Goal Achievement: 09/04/20 Potential to Achieve Goals: Fair ADL Goals Pt Will Perform  Grooming: with min guard assist;standing (one activity) Pt Will Perform Lower Body Bathing: with min guard assist;sit to/from stand;with adaptive equipment Pt Will Perform Lower Body Dressing: with min guard assist;with adaptive equipment;sit to/from stand Pt Will Transfer to Toilet: with min guard assist;ambulating;bedside commode (over toilet) Pt Will Perform Toileting - Clothing Manipulation and hygiene: with min guard assist;sit to/from stand;with adaptive equipment Additional ADL Goal #1: Pt will gather items necessary for ADL around her room with supervision.  OT Frequency: Min 2X/week   Barriers to D/C:            Co-evaluation              AM-PAC OT "6 Clicks" Daily Activity     Outcome Measure Help from another person eating meals?: None Help from another person taking care of personal grooming?: A Little Help from another person toileting, which includes using toliet, bedpan, or urinal?: Total Help from another person bathing (including washing, rinsing, drying)?: A Lot Help from another person to put on and taking off regular upper body clothing?: A Little Help from another person to put on and taking off regular lower body clothing?: Total 6 Click Score: 14   End of Session    Activity Tolerance: Patient tolerated treatment well Patient left: in bed;with call bell/phone within reach  OT Visit Diagnosis: History of falling (Z91.81);Pain;Other abnormalities of gait and mobility (R26.89);Muscle weakness (generalized) (M62.81)  Time: 1205-1228 OT Time Calculation (min): 23 min Charges:  OT General Charges $OT Visit: 1 Visit OT Evaluation $OT Eval Moderate Complexity: 1 Mod OT Treatments $Self Care/Home Management : 8-22 mins  Nestor Lewandowsky, OTR/L Acute Rehabilitation Services Pager: [phone] Office: [phone]  Heather [name] 08/21/2020, 2:28 PM

## 2020-08-21 NOTE — Progress Notes (Signed)
Pt. Refuse CPAP, RT will continue to monitor.

## 2020-08-21 NOTE — Progress Notes (Signed)
Inpatient Rehab Admissions Coordinator:  Consulted with physiatrist.  It is felt that a long-term care facility would be a more appropriate rehab venue for pt. Informed pt, TOC, and MD of decision.  Will sign off.

## 2020-08-21 NOTE — Progress Notes (Addendum)
PROGRESS NOTE    Heather [name]   [id number]  DOB: 12/28/1975  DOA: 08/18/2020 PCP: Lucianne Lei, MD   Brief Narrative:  Heather [name] is a 45 year old female with morbid obesity, obstructive sleep apnea, COPD, MDD, GAD, insomnia and RLS, diabetes mellitus type 2,renal mass,  and numerous small wounds under her pannus, upper thighs and between her legs. The patient was discharged from [facility] on 5/13 where she was admitted since 5/4. She was treated for a complicated Proteus mirabilis UTI, AKI which improved with IV fluids, traumatic rhabdomyolysis secondary to a fall.  It was recommended that she be transferred to [facility] inpatient rehab however, she decided that she would rather go back to her motel. While walking in the motel on her way back home yesterday, she suddenly felt weak, her right leg gave out and she fell to the floor.  She was brought back to the hospital and noted to be hypotensive and tachycardic.    Blood pressure was about 81/49, heart rate in low 100s. Her creatinine was 2.00, increased from 1.66 when last checked on 5/11. WBC or mildly elevated at 12.4, increased from 7.2 when last checked on 5/9. Lactic acid was 2.6. UA revealed large leukocytes, negative nitrite, rare bacteria, greater than 50 RBCs and greater than 50 WBCs. She was given 1600 cc normal saline bolus and a gram of Rocephin in the ED.  Subjective: She has no new complaints.     Assessment & Plan:   Principal Problem:   SIRS (systemic inflammatory response syndrome) vs dehydration    Lactic acidosis -She is currently receiving vancomycin, ceftriaxone and doxycycline -Recent Proteus mirabilis urinary tract infection was treated for 5 days with appropriate antibiotics - Panniculitis was treated with vancomycin and then Zyvox for 7 days total - Both antibiotics was stopped on 5/11 -  Blood and urine cultures both negative to date- - 5/15> d/c Antibiotics and monitor for signs of  infection - continues to be stable today- encouraged oral intake off fluid again   Active Problems:  CKD stage III - Her creatinine had improved to about 1.66 on 5/11 -Today is 1.43  Right renal mass - 8.4 cm right renal mass noted on renal ultrasound -Previously noted to be around 6 cm & multiseptated on a lumbar MRI in 7/20 - Finding of mass was discussed with urology on her last admission- it was recommended that she have an outpatient work-up - The patient states that she was scheduled to see a urologist on 08/16/2020  Morbid obesity - Body mass index is 82.79 kg/m.   Moisture wounds on abdomen, thighs, upper legs - the following order placed based on last wound care eval > Per WOC: "Continue antimicrobial wicking fabric for coverage of yeast and moisture management.Add silvadene for topical care to open lesions.Make sure that she is bathed and all products removed completely, allowed to dry and reapplied." - she has a foley to prevent contamination of her wounds  Normocytic Anemia, folic acid deficiency- likely AOCD as well - Hemoglobin has been in the 7-8 range- -Anemia panel obtained on 5/5 revealed a folic acid that was 4.2, ferritin was normal and iron binding capacity was decreased- B12 was elevated - have started oral folate replacement  MDD - cont Wellbutrin, Cymbalta  GAD - Buspar BID- does not appear to be oversedated  Insomnia -  Seroquel at bedtime  RLS - cont Requip  Time spent in minutes: 35 DVT prophylaxis:  Lovenox Code Status: full  code Family Communication:  Level of Care: Level of care: Med-Surg- transfer to med/surg Disposition Plan:  Status is: Inpatient  The patient will require care spanning > 2 midnights and should be moved to inpatient because: IV treatments appropriate due to intensity of illness or inability to take PO  Dispo: The patient is from: Home              Anticipated d/c is to: CIR vs home              Patient currently is  not medically stable to d/c.   Difficult to place patient Yes    Consultants:   none Procedures:   none Antimicrobials:  Anti-infectives (From admission, onward)   Start     Dose/Rate Route Frequency Ordered Stop   08/20/20 0600  vancomycin (VANCOREADY) IVPB 1750 mg/350 mL  Status:  Discontinued        1,750 mg 87.5 mL/hr over 240 Minutes Intravenous Every 24 hours 08/19/20 0345 08/20/20 1238   08/19/20 0430  doxycycline (VIBRAMYCIN) 100 mg in sodium chloride 0.9 % 250 mL IVPB  Status:  Discontinued        100 mg 125 mL/hr over 120 Minutes Intravenous Every 12 hours 08/19/20 0335 08/19/20 0337   08/19/20 0430  vancomycin (VANCOCIN) 2,500 mg in sodium chloride 0.9 % 500 mL IVPB        2,500 mg 125 mL/hr over 240 Minutes Intravenous NOW 08/19/20 0341 08/19/20 0838   08/18/20 1730  cefTRIAXone (ROCEPHIN) 1 g in sodium chloride 0.9 % 100 mL IVPB  Status:  Discontinued        1 g 200 mL/hr over 30 Minutes Intravenous Every 24 hours 08/18/20 1715 08/20/20 1238       Objective: Vitals:   08/20/20 2338 08/21/20 0340 08/21/20 0816 08/21/20 1055  BP: (!) 113/56 (!) 107/59 120/61 135/69  Pulse: 89 78 92 96  Resp: 18 18 20 20   Temp: 97.9 F (36.6 C) 97.9 F (36.6 C) 98.2 F (36.8 C) 98.6 F (37 C)  TempSrc: Oral Oral Oral Oral  SpO2: 94% 94% 96% 94%  Weight:      Height:        Intake/Output Summary (Last 24 hours) at 08/21/2020 1258 Last data filed at 08/20/2020 2341 Gross per 24 hour  Intake 590 ml  Output 2750 ml  Net -2160 ml   Filed Weights   08/18/20 2230  Weight: (!) 212 kg    Examination: General exam: Appears comfortable  HEENT: PERRLA, oral mucosa moist, no sclera icterus or thrush Respiratory system: Clear to auscultation. Respiratory effort normal. Cardiovascular system: S1 & S2 heard, regular rate and rhythm Gastrointestinal system: Abdomen soft, non-tender, nondistended. Normal bowel sounds   Central nervous system: Alert and oriented. No focal  neurological deficits. Extremities: No cyanosis, clubbing or edema Skin: ulcers noted under pannus and in between legs Psychiatry:  Mood & affect appropriate.    Data Reviewed: I have personally reviewed following labs and imaging studies  CBC: Recent Labs  Lab 08/18/20 1936 08/18/20 2050 08/19/20 0208 08/21/20 0147  WBC 12.4*  --  8.2 7.6  NEUTROABS 9.2*  --   --   --   HGB 9.2* 11.2* 7.8* 7.8*  HCT 33.5* 33.0* 28.5* 28.2*  MCV 80.0  --  79.2* 79.9*  PLT 363  --  [id number]   Basic Metabolic Panel: Recent Labs  Lab 08/15/20 0400 08/16/20 0407 08/18/20 1936 08/18/20 2050 08/19/20 0208 08/21/20 0147  NA  140 141 136 140 136 134*  K 3.4* 4.5 3.7 3.7 3.4* 3.5  CL 112* 113* 103 104 105 103  CO2 24 21* 21*  --  22 26  GLUCOSE 82 77 78 72 77 79  BUN 17 14 11 12 11 7   CREATININE 2.14* 1.66* 1.97* 2.00* 1.77* 1.43*  CALCIUM 8.2* 8.4* 8.8*  --  8.8* 8.8*  MG 2.0 1.9  --   --   --   --    GFR: Estimated Creatinine Clearance: 94 mL/min (A) (by C-G formula based on SCr of 1.43 mg/dL (H)). Liver Function Tests: Recent Labs  Lab 08/18/20 1936 08/19/20 0208  AST 25 20  ALT 24 21  ALKPHOS 64 60  BILITOT 0.6 0.4  PROT 7.0 6.2*  ALBUMIN 2.3* 2.1*   No results for input(s): LIPASE, AMYLASE in the last 168 hours. No results for input(s): AMMONIA in the last 168 hours. Coagulation Profile: Recent Labs  Lab 08/18/20 1936  INR 1.1   Cardiac Enzymes: Recent Labs  Lab 08/18/20 1936  CKTOTAL 46   BNP (last 3 results) No results for input(s): PROBNP in the last 8760 hours. HbA1C: No results for input(s): HGBA1C in the last 72 hours. CBG: Recent Labs  Lab 08/17/20 1139 08/17/20 1706 08/17/20 2102 08/18/20 0805 08/18/20 1159  GLUCAP 90 104* 90 78 94   Lipid Profile: No results for input(s): CHOL, HDL, LDLCALC, TRIG, CHOLHDL, LDLDIRECT in the last 72 hours. Thyroid Function Tests: No results for input(s): TSH, T4TOTAL, FREET4, T3FREE, THYROIDAB in the last 72  hours. Anemia Panel: No results for input(s): VITAMINB12, FOLATE, FERRITIN, TIBC, IRON, RETICCTPCT in the last 72 hours. Urine analysis:    Component Value Date/Time   COLORURINE YELLOW 08/18/2020 1936   APPEARANCEUR CLOUDY (A) 08/18/2020 1936   APPEARANCEUR Cloudy (A) 08/06/2016 1209   LABSPEC 1.012 08/18/2020 1936   PHURINE 5.0 08/18/2020 1936   GLUCOSEU NEGATIVE 08/18/2020 1936   HGBUR MODERATE (A) 08/18/2020 1936   HGBUR large 04/19/2008 0952   BILIRUBINUR NEGATIVE 08/18/2020 1936   BILIRUBINUR Negative 08/06/2016 1209   KETONESUR NEGATIVE 08/18/2020 1936   PROTEINUR 100 (A) 08/18/2020 1936   UROBILINOGEN 0.2 12/06/2014 2049   NITRITE NEGATIVE 08/18/2020 1936   LEUKOCYTESUR LARGE (A) 08/18/2020 1936   Sepsis Labs: @LABRCNTIP (procalcitonin:4,lacticidven:4) ) Recent Results (from the past 240 hour(s))  Aerobic Culture w Gram Stain (superficial specimen)     Status: None   Collection Time: 08/12/20 12:46 PM   Specimen: Wound  Result Value Ref Range Status   Specimen Description WOUND  Final   Special Requests NONE  Final   Gram Stain   Final    RARE WBC PRESENT, PREDOMINANTLY MONONUCLEAR NO ORGANISMS SEEN    Culture RARE ESCHERICHIA COLI FEW ENTEROCOCCUS FAECALIS   Final   Report Status 08/16/2020 FINAL  Final   Organism ID, Bacteria ESCHERICHIA COLI  Final   Organism ID, Bacteria ENTEROCOCCUS FAECALIS  Final      Susceptibility   Escherichia coli - MIC*    AMPICILLIN <=2 SENSITIVE Sensitive     CEFAZOLIN <=4 SENSITIVE Sensitive     CEFEPIME <=0.12 SENSITIVE Sensitive     CEFTAZIDIME <=1 SENSITIVE Sensitive     CEFTRIAXONE <=0.25 SENSITIVE Sensitive     CIPROFLOXACIN <=0.25 SENSITIVE Sensitive     GENTAMICIN <=1 SENSITIVE Sensitive     IMIPENEM <=0.25 SENSITIVE Sensitive     TRIMETH/SULFA <=20 SENSITIVE Sensitive     AMPICILLIN/SULBACTAM <=2 SENSITIVE Sensitive     PIP/TAZO <=  4 SENSITIVE Sensitive     * RARE ESCHERICHIA COLI   Enterococcus faecalis - MIC*     AMPICILLIN <=2 SENSITIVE Sensitive     LEVOFLOXACIN 1 SENSITIVE Sensitive     VANCOMYCIN 1 SENSITIVE Sensitive     GENTAMICIN SYNERGY Value in next row Sensitive      SENSITIVEPerformed at [facility] [address] [place], [place] [address]    * FEW ENTEROCOCCUS FAECALIS  Resp Panel by RT-PCR (Flu A&B, Covid) Nasopharyngeal Swab     Status: None   Collection Time: 08/18/20  7:36 PM   Specimen: Nasopharyngeal Swab; Nasopharyngeal(NP) swabs in vial transport medium  Result Value Ref Range Status   SARS Coronavirus 2 by RT PCR NEGATIVE NEGATIVE Final    Comment: (NOTE) SARS-CoV-2 target nucleic acids are NOT DETECTED.  The SARS-CoV-2 RNA is generally detectable in upper respiratory specimens during the acute phase of infection. The lowest concentration of SARS-CoV-2 viral copies this assay can detect is 138 copies/mL. A negative result does not preclude SARS-Cov-2 infection and should not be used as the sole basis for treatment or other patient management decisions. A negative result may occur with  improper specimen collection/handling, submission of specimen other than nasopharyngeal swab, presence of viral mutation(s) within the areas targeted by this assay, and inadequate number of viral copies(<138 copies/mL). A negative result must be combined with clinical observations, patient history, and epidemiological information. The expected result is Negative.  Fact Sheet for Patients:  EntrepreneurPulse.com.au  Fact Sheet for Healthcare Providers:  IncredibleEmployment.be  This test is no t yet approved or cleared by the [place] FDA and  has been authorized for detection and/or diagnosis of SARS-CoV-2 by FDA under an Emergency Use Authorization (EUA). This EUA will remain  in effect (meaning this test can be used) for the duration of the COVID-19 declaration under Section 564(b)(1) of the Act, 21 U.S.C.section 360bbb-3(b)(1),  unless the authorization is terminated  or revoked sooner.       Influenza A by PCR NEGATIVE NEGATIVE Final   Influenza B by PCR NEGATIVE NEGATIVE Final    Comment: (NOTE) The Xpert Xpress SARS-CoV-2/FLU/RSV plus assay is intended as an aid in the diagnosis of influenza from Nasopharyngeal swab specimens and should not be used as a sole basis for treatment. Nasal washings and aspirates are unacceptable for Xpert Xpress SARS-CoV-2/FLU/RSV testing.  Fact Sheet for Patients: EntrepreneurPulse.com.au  Fact Sheet for Healthcare Providers: IncredibleEmployment.be  This test is not yet approved or cleared by the [place] FDA and has been authorized for detection and/or diagnosis of SARS-CoV-2 by FDA under an Emergency Use Authorization (EUA). This EUA will remain in effect (meaning this test can be used) for the duration of the COVID-19 declaration under Section 564(b)(1) of the Act, 21 U.S.C. section 360bbb-3(b)(1), unless the authorization is terminated or revoked.  Performed at [facility], [place] [address], [place], [place] [address]   Urine culture     Status: None   Collection Time: 08/18/20  7:36 PM   Specimen: In/Out Cath Urine  Result Value Ref Range Status   Specimen Description IN/OUT CATH URINE  Final   Special Requests NONE  Final   Culture   Final    NO GROWTH Performed at [facility], [place] [address] [place], [place] [address]    Report Status 08/20/2020 FINAL  Final  Blood Culture (routine x 2)     Status: None (Preliminary result)   Collection Time: 08/18/20  7:53 PM   Specimen: BLOOD  Result Value Ref Range Status   Specimen Description BLOOD RIGHT ARM  Final   Special Requests   Final    BOTTLES DRAWN AEROBIC AND ANAEROBIC Blood Culture results may not be optimal due to an inadequate volume of blood received in culture bottles   Culture   Final    NO GROWTH 2 DAYS Performed at [facility],  [place] [address], [place], [place] [address]    Report Status PENDING  Incomplete  Blood Culture (routine x 2)     Status: None (Preliminary result)   Collection Time: 08/18/20  8:38 PM   Specimen: BLOOD RIGHT HAND  Result Value Ref Range Status   Specimen Description BLOOD RIGHT HAND  Final   Special Requests   Final    BOTTLES DRAWN AEROBIC AND ANAEROBIC Blood Culture results may not be optimal due to an inadequate volume of blood received in culture bottles   Culture   Final    NO GROWTH 2 DAYS Performed at [facility], [place] [address], [place], [place] [address]    Report Status PENDING  Incomplete         Radiology Studies: No results found.    Scheduled Meds: . buPROPion  300 mg Oral Daily  . busPIRone  10 mg Oral BID  . Chlorhexidine Gluconate Cloth  6 each Topical Daily  . diphenhydrAMINE  50 mg Intravenous Q24H  . DULoxetine  60 mg Oral Daily  . enoxaparin (LOVENOX) injection  100 mg Subcutaneous Q24H  . fluticasone  1 spray Each Nare Daily  . folic acid  1 mg Oral Daily  . montelukast  10 mg Oral QHS  . QUEtiapine  25 mg Oral QHS  . rOPINIRole  3 mg Oral QHS  . silver sulfADIAZINE   Topical BID  . vitamin B-12  5,000 mcg Oral Daily   Continuous Infusions:    LOS: 2 days      Debbe Odea, MD [facility] Pager: [email or web address] 08/21/2020, 12:58 PM

## 2020-08-22 NOTE — Progress Notes (Signed)
PROGRESS NOTE    Ryen Aebersold Obst   [id number]  DOB: 11/26/1975  DOA: 08/18/2020 PCP: Lucianne Lei, MD   Brief Narrative:  Rhawnie Hermans Khawaja is a 45 year old female with morbid obesity, obstructive sleep apnea, COPD, MDD, GAD, insomnia and RLS, diabetes mellitus type 2,renal mass,  and numerous small wounds under her pannus, upper thighs and between her legs. The patient was discharged from [facility] on 5/13 where she was admitted since 5/4. She was treated for a complicated Proteus mirabilis UTI, AKI which improved with IV fluids, traumatic rhabdomyolysis secondary to a fall.  It was recommended that she be transferred to [facility] inpatient rehab however, she decided that she would rather go back to her motel. While walking in the motel on her way back home yesterday, she suddenly felt weak, her right leg gave out and she fell to the floor.  She was brought back to the hospital and noted to be hypotensive and tachycardic.    Blood pressure was about 81/49, heart rate in low 100s. Her creatinine was 2.00, increased from 1.66 when last checked on 5/11. WBC or mildly elevated at 12.4, increased from 7.2 when last checked on 5/9. Lactic acid was 2.6. UA revealed large leukocytes, negative nitrite, rare bacteria, greater than 50 RBCs and greater than 50 WBCs. She was given 1600 cc normal saline bolus and a gram of Rocephin in the ED.  Subjective: Asking if IV on left arm can come out as the tape is itching her. No other complaints    Assessment & Plan:   Principal Problem:   SIRS (systemic inflammatory response syndrome) vs dehydration    Lactic acidosis -She is currently receiving vancomycin, ceftriaxone and doxycycline -Recent Proteus mirabilis urinary tract infection was treated for 5 days with appropriate antibiotics - Panniculitis was treated with vancomycin and then Zyvox for 7 days total - Both antibiotics was stopped on 5/11 -  Blood and urine cultures both negative to  date- - 5/15> d/c Antibiotics and monitor for signs of infection - continues to be stable today- encouraged oral intake off fluid     Active Problems:  CKD stage III - Her creatinine had improved to about 1.66 on 5/11 - Cr 1.43 on 5/16  Right renal mass - 8.4 cm right renal mass noted on renal ultrasound -Previously noted to be around 6 cm & multiseptated on a lumbar MRI in 7/20 - Finding of mass was discussed with urology on her last admission- it was recommended that she have an outpatient work-up - The patient states that she was scheduled to see a urologist on 08/16/2020  Morbid obesity - Body mass index is 91.56 kg/m.   Moisture wounds on abdomen, thighs, upper legs - the following order placed based on last wound care eval > Per WOC: "Continue antimicrobial wicking fabric for coverage of yeast and moisture management.Add silvadene for topical care to open lesions.Make sure that she is bathed and all products removed completely, allowed to dry and reapplied." - she has a foley to prevent contamination of her wounds  Normocytic Anemia, folic acid deficiency- likely AOCD as well - Hemoglobin has been in the 7-8 range- -Anemia panel obtained on 5/5 revealed a folic acid that was 4.2, ferritin was normal and iron binding capacity was decreased- B12 was elevated - have started oral folate replacement  MDD - cont Wellbutrin, Cymbalta  GAD - Buspar BID- does not appear to be oversedated  Insomnia -  Seroquel at bedtime  RLS -  cont Requip  Time spent in minutes: 35 DVT prophylaxis:  Lovenox Code Status: full code Family Communication:  Level of Care: Level of care: Med-Surg   Disposition Plan:  Status is: Inpatient  The patient will require care spanning > 2 midnights and should be moved to inpatient because: IV treatments appropriate due to intensity of illness or inability to take PO  Dispo: The patient is from: Home              Anticipated d/c is to: CIR vs SNF-  CIR is looking again to see if they can accept her              Patient currently is not medically stable to d/c.   Difficult to place patient Yes    Consultants:   none Procedures:   none Antimicrobials:  Anti-infectives (From admission, onward)   Start     Dose/Rate Route Frequency Ordered Stop   08/20/20 0600  vancomycin (VANCOREADY) IVPB 1750 mg/350 mL  Status:  Discontinued        1,750 mg 87.5 mL/hr over 240 Minutes Intravenous Every 24 hours 08/19/20 0345 08/20/20 1238   08/19/20 0430  doxycycline (VIBRAMYCIN) 100 mg in sodium chloride 0.9 % 250 mL IVPB  Status:  Discontinued        100 mg 125 mL/hr over 120 Minutes Intravenous Every 12 hours 08/19/20 0335 08/19/20 0337   08/19/20 0430  vancomycin (VANCOCIN) 2,500 mg in sodium chloride 0.9 % 500 mL IVPB        2,500 mg 125 mL/hr over 240 Minutes Intravenous NOW 08/19/20 0341 08/19/20 0838   08/18/20 1730  cefTRIAXone (ROCEPHIN) 1 g in sodium chloride 0.9 % 100 mL IVPB  Status:  Discontinued        1 g 200 mL/hr over 30 Minutes Intravenous Every 24 hours 08/18/20 1715 08/20/20 1238       Objective: Vitals:   08/21/20 2045 08/22/20 0543 08/22/20 0725 08/22/20 0900  BP: (!) 115/59 112/61 96/66   Pulse: 92 89 87   Resp: 18 18 15    Temp: 98.5 F (36.9 C) 97.8 F (36.6 C) 98 F (36.7 C)   TempSrc: Oral Oral Oral   SpO2: 94% 94% 96%   Weight:    (!) 234.5 kg  Height:    5\' 3"  (1.6 m)    Intake/Output Summary (Last 24 hours) at 08/22/2020 1340 Last data filed at 08/22/2020 0900 Gross per 24 hour  Intake 600 ml  Output 2625 ml  Net -2025 ml   Filed Weights   08/18/20 2230 08/22/20 0900  Weight: (!) 212 kg (!) 234.5 kg    Examination: General exam: Appears comfortable  HEENT: PERRLA, oral mucosa moist, no sclera icterus or thrush Respiratory system: Clear to auscultation. Respiratory effort normal. Cardiovascular system: S1 & S2 heard, regular rate and rhythm Gastrointestinal system: Abdomen soft, non-tender,  nondistended. Normal bowel sounds   Central nervous system: Alert and oriented. No focal neurological deficits. Extremities: No cyanosis, clubbing or edema Skin: wounds under pannus, upper thighs and in between thighs noted Psychiatry:  Mood & affect appropriate.    Data Reviewed: I have personally reviewed following labs and imaging studies  CBC: Recent Labs  Lab 08/18/20 1936 08/18/20 2050 08/19/20 0208 08/21/20 0147  WBC 12.4*  --  8.2 7.6  NEUTROABS 9.2*  --   --   --   HGB 9.2* 11.2* 7.8* 7.8*  HCT 33.5* 33.0* 28.5* 28.2*  MCV 80.0  --  79.2* 79.9*  PLT 363  --  361 782   Basic Metabolic Panel: Recent Labs  Lab 08/16/20 0407 08/18/20 1936 08/18/20 2050 08/19/20 0208 08/21/20 0147  NA 141 136 140 136 134*  K 4.5 3.7 3.7 3.4* 3.5  CL 113* 103 104 105 103  CO2 21* 21*  --  22 26  GLUCOSE 77 78 72 77 79  BUN 14 11 12 11 7   CREATININE 1.66* 1.97* 2.00* 1.77* 1.43*  CALCIUM 8.4* 8.8*  --  8.8* 8.8*  MG 1.9  --   --   --   --    GFR: Estimated Creatinine Clearance: 101.3 mL/min (A) (by C-G formula based on SCr of 1.43 mg/dL (H)). Liver Function Tests: Recent Labs  Lab 08/18/20 1936 08/19/20 0208  AST 25 20  ALT 24 21  ALKPHOS 64 60  BILITOT 0.6 0.4  PROT 7.0 6.2*  ALBUMIN 2.3* 2.1*   No results for input(s): LIPASE, AMYLASE in the last 168 hours. No results for input(s): AMMONIA in the last 168 hours. Coagulation Profile: Recent Labs  Lab 08/18/20 1936  INR 1.1   Cardiac Enzymes: Recent Labs  Lab 08/18/20 1936  CKTOTAL 46   BNP (last 3 results) No results for input(s): PROBNP in the last 8760 hours. HbA1C: No results for input(s): HGBA1C in the last 72 hours. CBG: Recent Labs  Lab 08/17/20 1139 08/17/20 1706 08/17/20 2102 08/18/20 0805 08/18/20 1159  GLUCAP 90 104* 90 78 94   Lipid Profile: No results for input(s): CHOL, HDL, LDLCALC, TRIG, CHOLHDL, LDLDIRECT in the last 72 hours. Thyroid Function Tests: No results for input(s): TSH,  T4TOTAL, FREET4, T3FREE, THYROIDAB in the last 72 hours. Anemia Panel: No results for input(s): VITAMINB12, FOLATE, FERRITIN, TIBC, IRON, RETICCTPCT in the last 72 hours. Urine analysis:    Component Value Date/Time   COLORURINE YELLOW 08/18/2020 1936   APPEARANCEUR CLOUDY (A) 08/18/2020 1936   APPEARANCEUR Cloudy (A) 08/06/2016 1209   LABSPEC 1.012 08/18/2020 1936   PHURINE 5.0 08/18/2020 1936   GLUCOSEU NEGATIVE 08/18/2020 1936   HGBUR MODERATE (A) 08/18/2020 1936   HGBUR large 04/19/2008 [place] 08/18/2020 1936   BILIRUBINUR Negative 08/06/2016 [place] 08/18/2020 1936   PROTEINUR 100 (A) 08/18/2020 1936   UROBILINOGEN 0.2 12/06/2014 2049   NITRITE NEGATIVE 08/18/2020 1936   LEUKOCYTESUR LARGE (A) 08/18/2020 1936   Sepsis Labs: @LABRCNTIP (procalcitonin:4,lacticidven:4) ) Recent Results (from the past 240 hour(s))  Resp Panel by RT-PCR (Flu A&B, Covid) Nasopharyngeal Swab     Status: None   Collection Time: 08/18/20  7:36 PM   Specimen: Nasopharyngeal Swab; Nasopharyngeal(NP) swabs in vial transport medium  Result Value Ref Range Status   SARS Coronavirus 2 by RT PCR NEGATIVE NEGATIVE Final    Comment: (NOTE) SARS-CoV-2 target nucleic acids are NOT DETECTED.  The SARS-CoV-2 RNA is generally detectable in upper respiratory specimens during the acute phase of infection. The lowest concentration of SARS-CoV-2 viral copies this assay can detect is 138 copies/mL. A negative result does not preclude SARS-Cov-2 infection and should not be used as the sole basis for treatment or other patient management decisions. A negative result may occur with  improper specimen collection/handling, submission of specimen other than nasopharyngeal swab, presence of viral mutation(s) within the areas targeted by this assay, and inadequate number of viral copies(<138 copies/mL). A negative result must be combined with clinical observations, patient history,  and epidemiological information. The expected result is Negative.  Fact  Sheet for Patients:  EntrepreneurPulse.com.au  Fact Sheet for Healthcare Providers:  IncredibleEmployment.be  This test is no t yet approved or cleared by the [place] FDA and  has been authorized for detection and/or diagnosis of SARS-CoV-2 by FDA under an Emergency Use Authorization (EUA). This EUA will remain  in effect (meaning this test can be used) for the duration of the COVID-19 declaration under Section 564(b)(1) of the Act, 21 U.S.C.section 360bbb-3(b)(1), unless the authorization is terminated  or revoked sooner.       Influenza A by PCR NEGATIVE NEGATIVE Final   Influenza B by PCR NEGATIVE NEGATIVE Final    Comment: (NOTE) The Xpert Xpress SARS-CoV-2/FLU/RSV plus assay is intended as an aid in the diagnosis of influenza from Nasopharyngeal swab specimens and should not be used as a sole basis for treatment. Nasal washings and aspirates are unacceptable for Xpert Xpress SARS-CoV-2/FLU/RSV testing.  Fact Sheet for Patients: EntrepreneurPulse.com.au  Fact Sheet for Healthcare Providers: IncredibleEmployment.be  This test is not yet approved or cleared by the [place] FDA and has been authorized for detection and/or diagnosis of SARS-CoV-2 by FDA under an Emergency Use Authorization (EUA). This EUA will remain in effect (meaning this test can be used) for the duration of the COVID-19 declaration under Section 564(b)(1) of the Act, 21 U.S.C. section 360bbb-3(b)(1), unless the authorization is terminated or revoked.  Performed at [facility], [place] [address], [place], [place] [address]   Urine culture     Status: None   Collection Time: 08/18/20  7:36 PM   Specimen: In/Out Cath Urine  Result Value Ref Range Status   Specimen Description IN/OUT CATH URINE  Final   Special Requests NONE  Final   Culture    Final    NO GROWTH Performed at [facility], [place] [address], [place], [place] [address]    Report Status 08/20/2020 FINAL  Final  Blood Culture (routine x 2)     Status: None (Preliminary result)   Collection Time: 08/18/20  7:53 PM   Specimen: BLOOD  Result Value Ref Range Status   Specimen Description BLOOD RIGHT ARM  Final   Special Requests   Final    BOTTLES DRAWN AEROBIC AND ANAEROBIC Blood Culture results may not be optimal due to an inadequate volume of blood received in culture bottles   Culture   Final    NO GROWTH 4 DAYS Performed at [facility], [place] [address] [place], [place] [address]    Report Status PENDING  Incomplete  Blood Culture (routine x 2)     Status: None (Preliminary result)   Collection Time: 08/18/20  8:38 PM   Specimen: BLOOD RIGHT HAND  Result Value Ref Range Status   Specimen Description BLOOD RIGHT HAND  Final   Special Requests   Final    BOTTLES DRAWN AEROBIC AND ANAEROBIC Blood Culture results may not be optimal due to an inadequate volume of blood received in culture bottles   Culture   Final    NO GROWTH 4 DAYS Performed at [facility], [place] [address], [place],  [address]    Report Status PENDING  Incomplete         Radiology Studies: No results found.    Scheduled Meds: . buPROPion  300 mg Oral Daily  . busPIRone  10 mg Oral BID  . Chlorhexidine Gluconate Cloth  6 each Topical Daily  . DULoxetine  60 mg Oral Daily  .  enoxaparin (LOVENOX) injection  100 mg Subcutaneous Q24H  . fluticasone  1 spray Each Nare Daily  . folic acid  1 mg Oral Daily  . montelukast  10 mg Oral QHS  . QUEtiapine  25 mg Oral QHS  . rOPINIRole  3 mg Oral QHS  . silver sulfADIAZINE   Topical BID  . vitamin B-12  5,000 mcg Oral Daily   Continuous Infusions:    LOS: 3 days      Debbe Odea, MD [facility] Pager: [email or web address] 08/22/2020, 1:40 PM

## 2020-08-22 NOTE — Plan of Care (Signed)
Washed all the body folds where pressure injuries are and applied silvadene cream. Inner dry on the groins and under abdominal fold. Patient refused to roll saying she is comfortable and that she is able to let us know if needed cleaned from having a BM. Pleasant, able to make needs known. Attending requested this RN remove the IV and have patient drink fluids. Patient was complaining of IV bothering her and it was itchy from the tape.  Problem: Fluid Volume: Goal: Hemodynamic stability will improve Outcome: Progressing   Problem: Clinical Measurements: Goal: Diagnostic test results will improve Outcome: Progressing   Problem: Respiratory: Goal: Ability to maintain adequate ventilation will improve Outcome: Progressing   Problem: Education: Goal: Knowledge of General Education information will improve Description: Including pain rating scale, medication(s)/side effects and non-pharmacologic comfort measures Outcome: Progressing   Problem: Health Behavior/Discharge Planning: Goal: Ability to manage health-related needs will improve Outcome: Progressing   Problem: Activity: Goal: Risk for activity intolerance will decrease Outcome: Progressing   Problem: Nutrition: Goal: Adequate nutrition will be maintained Outcome: Progressing   Problem: Elimination: Goal: Will not experience complications related to bowel motility Outcome: Progressing Goal: Will not experience complications related to urinary retention Outcome: Progressing   Problem: Pain Managment: Goal: General experience of comfort will improve Outcome: Progressing

## 2020-08-22 NOTE — Progress Notes (Signed)
Inpatient Rehab Admissions Coordinator:   Spoke with rehab medical director, Dr. Naaman Plummer, who has reviewed patient's chart and agrees to consult for CIR for potential admission with short LOS.  I will place an order per our protocol.    Shann Medal, PT, DPT Admissions Coordinator [phone] 08/22/20  2:20 PM

## 2020-08-22 NOTE — Progress Notes (Signed)
Physical Therapy Treatment Patient Details Name: Heather [name] MRN: [id number] DOB: 09/01/1975 Today's Date: 08/22/2020    History of Present Illness 45 y.o. female admitted to ED for hypotension with fall with concerning features for possible developing sepsis. Head Chest abdomen pelvis CT with no acute findings. Of note pt with recent hospitalization for UTI and multiple wounds with/ pancolitis found to have renal faliure and rhabdomyolsis. PMH includes morbid obesity, obstructive sleep apnea, COPD, anxiety and depression, diabetes mellitus type 2, bipolar disorder, renal mass, restless leg syndrome and numerous small wounds under her pannus, upper thighs and between her legs.    PT Comments    Pt supine in bed on arrival.  Pt does not appear to be comfortable.  Performed bed mobility, transfer and increased gt this session with min to mod assistance.  Pt continues to benefit from aggressive rehab in a post acute setting to improve strength and function before returning home.  Pt is primary caregiver for her twin 102 yr old daughters but has support from her mother.     Follow Up Recommendations  CIR     Equipment Recommendations  Wheelchair (measurements PT);Wheelchair cushion (measurements PT) (bariatric WC)    Recommendations for Other Services       Precautions / Restrictions Precautions Precautions: Fall Precaution Comments: multiple wounds in skin folds. 2 falls in the last two weeks prior two admission on 08/18/20.  Reports knees buckle. Restrictions Weight Bearing Restrictions: No    Mobility  Bed Mobility Overal bed mobility: Needs Assistance Bed Mobility: Supine to Sit     Supine to sit: Mod assist;+2 for safety/equipment     General bed mobility comments: Pt required assistance to move B LEs to edge of bed and to elevate trunk into a seated position.  Once she was able to scoot forward her balance drastically improved.    Transfers Overall transfer level:  Needs assistance Equipment used: Rolling walker (2 wheeled) (bariatric.) Transfers: Sit to/from Stand Sit to Stand: Min assist;+2 physical assistance         General transfer comment: Cues for hand placement to and from seated surface this session.  Increased time and effort to achieve.  Ambulation/Gait Ambulation/Gait assistance: Mod assist;+2 safety/equipment Gait Distance (Feet): 25 Feet Assistive device: Rolling walker (2 wheeled) Gait Pattern/deviations: Step-to pattern;Wide base of support;Trunk flexed Gait velocity: decreased   General Gait Details: Pt required assistance for pathway of RW and weight shifting.  Heather [name] chair follow for safety.  No evidence of buckling this session.  Cues for upper trunk control and pacing.  required standing rest break x 2.   Stairs             Wheelchair Mobility    Modified Rankin (Stroke Patients Only)       Balance Overall balance assessment: Needs assistance;History of Falls   Sitting balance-Leahy Scale: Fair       Standing balance-Leahy Scale: Poor                              Cognition Arousal/Alertness: Awake/alert Behavior During Therapy: WFL for tasks assessed/performed;Flat affect Overall Cognitive Status: Within Functional Limits for tasks assessed                                 General Comments: Needs encouragement.      Exercises General Exercises - Lower Extremity Long Arc  Quad: AROM;Both;10 reps;Seated Hip ABduction/ADduction: AROM;Both;10 reps;Seated Hip Flexion/Marching: AROM;Both;10 reps;Seated    General Comments        Pertinent Vitals/Pain Pain Assessment: Faces Faces Pain Scale: Hurts little more Pain Location: generalized Pain Descriptors / Indicators: Grimacing Pain Intervention(s): Monitored during session;Repositioned    Home Living                      Prior Function            PT Goals (current goals can now be found in the care plan  section) Acute Rehab PT Goals Patient Stated Goal: to go to rehab and return to prior level of function, ambulating independently around the home Potential to Achieve Goals: Good Progress towards PT goals: Progressing toward goals    Frequency    Min 3X/week      PT Plan Current plan remains appropriate    Co-evaluation              AM-PAC PT "6 Clicks" Mobility   Outcome Measure  Help needed turning from your back to your side while in a flat bed without using bedrails?: A Lot Help needed moving from lying on your back to sitting on the side of a flat bed without using bedrails?: A Lot Help needed moving to and from a bed to a chair (including a wheelchair)?: A Little Help needed standing up from a chair using your arms (e.g., wheelchair or bedside chair)?: A Little Help needed to walk in hospital room?: A Lot Help needed climbing 3-5 steps with a railing? : Total 6 Click Score: 13    End of Session Equipment Utilized During Treatment: Gait belt Activity Tolerance: Patient limited by fatigue Patient left: in chair;with call bell/phone within reach;with chair alarm set Nurse Communication: Mobility status;Need for lift equipment PT Visit Diagnosis: Difficulty in walking, not elsewhere classified (R26.2);Muscle weakness (generalized) (M62.81)     Time: 1607-3710 PT Time Calculation (min) (ACUTE ONLY): 29 min  Charges:  $Gait Training: 8-22 mins $Therapeutic Exercise: 8-22 mins                     Heather [name] , PTA Acute Rehabilitation Services Pager [phone] Office [phone]     Heather [name] Heather [name] 08/22/2020, 2:29 PM

## 2020-08-22 NOTE — Plan of Care (Signed)
  Problem: Fluid Volume: Goal: Hemodynamic stability will improve Outcome: Progressing   Problem: Clinical Measurements: Goal: Diagnostic test results will improve Outcome: Progressing Goal: Signs and symptoms of infection will decrease Outcome: Progressing   Problem: Respiratory: Goal: Ability to maintain adequate ventilation will improve Outcome: Progressing   Problem: Education: Goal: Knowledge of General Education information will improve Description: Including pain rating scale, medication(s)/side effects and non-pharmacologic comfort measures Outcome: Progressing   Problem: Health Behavior/Discharge Planning: Goal: Ability to manage health-related needs will improve Outcome: Progressing   Problem: Activity: Goal: Risk for activity intolerance will decrease Outcome: Progressing   Problem: Nutrition: Goal: Adequate nutrition will be maintained Outcome: Progressing   Problem: Coping: Goal: Level of anxiety will decrease Outcome: Progressing   Problem: Elimination: Goal: Will not experience complications related to bowel motility Outcome: Progressing Goal: Will not experience complications related to urinary retention Outcome: Progressing   Problem: Pain Managment: Goal: General experience of comfort will improve Outcome: Progressing   Problem: Safety: Goal: Ability to remain free from injury will improve Outcome: Progressing   Problem: Skin Integrity: Goal: Risk for impaired skin integrity will decrease Outcome: Progressing   

## 2020-08-23 LAB — CULTURE, BLOOD (ROUTINE X 2)
Culture: NO GROWTH
Culture: NO GROWTH

## 2020-08-23 MED ORDER — ENOXAPARIN SODIUM 120 MG/0.8ML IJ SOSY
120.0000 mg | PREFILLED_SYRINGE | INTRAMUSCULAR | Status: DC
  Administered 2020-08-23: 120 mg via SUBCUTANEOUS
  Filled 2020-08-23 (×2): qty 0.8

## 2020-08-23 NOTE — Progress Notes (Signed)
CPAP refused 

## 2020-08-23 NOTE — Progress Notes (Signed)
PROGRESS NOTE    Heather [name]  TKZ:[id number] DOB: 02/17/1976 DOA: 08/18/2020 PCP: Renaye Rakers, MD   Chief Complaint  Patient presents with  . Weakness  Brief Narrative: 45 year old female with morbid obesity OSA COPD MDD GAD, insomnia and RLS, T2DM renal mass numerous small wounds under her pannus upper thighs and in between her legs who was admitted [facility] from 5/4-5/13 treated for complicated Proteus mirabilis UTI, AKI, traumatic rhabdomyolysis secondary to fall, she improved and recommended that she be transferred to: Inpatient rehab however she decided she would rather go back to rheumatology and while walking in a motor Back home she suddenly felt weak, her right leg gave out and fell to the floor with her back to the hospital, found to be hypotensive, tachycardic Blood pressure was about 81/49, heart rate in low 100s. Her creatinine was 2.00, increased from 1.66 when last checked on 5/11. WBC or mildly elevated at 12.4, increased from 7.2 when last checked on 5/9. Lactic acid was 2.6. UA revealed large leukocytes, negative nitrite, rare bacteria, greater than 50 RBCs and greater than 50 WBCs. She was given 1600 cc normal saline bolus and a gram of Rocephin in the ED PT OT is working with her.  Subjective: Seen and examined this morning she is alert awake resting comfortably in bed.  Still feels weak.  She is hoping for rehab at this time.   Assessment & Plan:  SIRS vs dehydration with lactic acidosis, hypotension tachycardia AKI leukocytosis Recent Proteus UTI-treated with 5 days of antibiotics, panniculitis treated with vancomycin and then Zyvox for 7 days total and antibiotics started 5/11.  He had blood culture and urine culture negative to date.  5/15 discontinue antibiotics and is being monitored.  Remains afebrile no leukocytosis.  Encourage hydration.  Fall/deconditioning continue PT OT will benefit with start CIR placement  AKI CKD stage IIIa: Creatinine improved  to 1.4, was 2.0.  Encourage oral hydration Recent Labs  Lab 08/18/20 1936 08/18/20 2050 08/19/20 0208 08/21/20 0147  BUN 11 12 11 7   CREATININE 1.97* 2.00* 1.77* 1.43*   Right renal mass 8.4 cm noted on ultrasound, Previously noted to be around 6 cm & multiseptated on a lumbar MRI in 7/20.Finding of mass was discussed with urology on her last admission- it was recommended that she have an outpatient work-up.he patient states that she was scheduled to see a urologist on 08/16/2020.  She will need to follow-up outpatient.  Morbid obesity with BMI 91:Will benefit with aggressive weight loss, healthy lifestyle PCP follow-up sleep apnea evaluation with   OSA- cont cpap. Refusing  Moisture wounds on abdomen thighs and upper leg-seen by WOC previously-Continue current wound care PT OT rehab.  She has a Foley to prevent contamination of her wounds.  TDD:[facility] Requip  Insomnia continue Seroquel  MDD/GAD:  continue HER Wellbutrin, Cymbalta and BuSpar.    Normocytic anemia/folate deficiency likely AOCD: Hemoglobin is stable around 7 to 8 g range.  Anemia panel 5/5 showed folic acid 4.2 ferritin normal B12 was elevated continue folate supplement, monitor CBC   Diet Order            Diet Heart Room service appropriate? Yes; Fluid consistency: Thin  Diet effective now               Patient's Body mass index is 91.56 kg/m. DVT prophylaxis: lOVENOX Code Status:   Code Status: Full Code  Family Communication: plan of care discussed with patient at bedside.  Status is: Inpatient Remains  inpatient appropriate because:Unsafe d/c plan and Inpatient level of care appropriate due to severity of illness  Dispo: The patient is from: Home              Anticipated d/c is to: CIR              Patient currently is medically stable to d/c.   Difficult to place patient No  Unresulted Labs (From admission, onward)          Start     Ordered   08/25/20 0500  Creatinine, serum  (enoxaparin (LOVENOX)     CrCl >/= 30 ml/min)  Weekly,   R     Comments: while on enoxaparin therapy    08/18/20 2151         Medications reviewed:  Scheduled Meds: . buPROPion  300 mg Oral Daily  . busPIRone  10 mg Oral BID  . Chlorhexidine Gluconate Cloth  6 each Topical Daily  . DULoxetine  60 mg Oral Daily  . enoxaparin (LOVENOX) injection  120 mg Subcutaneous Q24H  . fluticasone  1 spray Each Nare Daily  . folic acid  1 mg Oral Daily  . montelukast  10 mg Oral QHS  . QUEtiapine  25 mg Oral QHS  . rOPINIRole  3 mg Oral QHS  . silver sulfADIAZINE   Topical BID  . vitamin B-12  5,000 mcg Oral Daily   Continuous Infusions:  Consultants:see note  Procedures:see note  Antimicrobials: Anti-infectives (From admission, onward)   Start     Dose/Rate Route Frequency Ordered Stop   08/20/20 0600  vancomycin (VANCOREADY) IVPB 1750 mg/350 mL  Status:  Discontinued        1,750 mg 87.5 mL/hr over 240 Minutes Intravenous Every 24 hours 08/19/20 0345 08/20/20 1238   08/19/20 0430  doxycycline (VIBRAMYCIN) 100 mg in sodium chloride 0.9 % 250 mL IVPB  Status:  Discontinued        100 mg 125 mL/hr over 120 Minutes Intravenous Every 12 hours 08/19/20 0335 08/19/20 0337   08/19/20 0430  vancomycin (VANCOCIN) 2,500 mg in sodium chloride 0.9 % 500 mL IVPB        2,500 mg 125 mL/hr over 240 Minutes Intravenous NOW 08/19/20 0341 08/19/20 0838   08/18/20 1730  cefTRIAXone (ROCEPHIN) 1 g in sodium chloride 0.9 % 100 mL IVPB  Status:  Discontinued        1 g 200 mL/hr over 30 Minutes Intravenous Every 24 hours 08/18/20 1715 08/20/20 1238     Culture/Microbiology    Component Value Date/Time   SDES BLOOD RIGHT HAND 08/18/2020 2038   SPECREQUEST  08/18/2020 2038    BOTTLES DRAWN AEROBIC AND ANAEROBIC Blood Culture results may not be optimal due to an inadequate volume of blood received in culture bottles   CULT  08/18/2020 2038    NO GROWTH 5 DAYS Performed at [facility], [place] [address] [place],  [place] [address]    REPTSTATUS 08/23/2020 FINAL 08/18/2020 2038    Other culture-see note  Objective: Vitals: Today's Vitals   08/23/20 0452 08/23/20 0700 08/23/20 1000 08/23/20 1100  BP: 105/76 122/62  125/67  Pulse: 80 88  90  Resp: 18 18  15   Temp: 98.2 F (36.8 C) 98.2 F (36.8 C)  98.1 F (36.7 C)  TempSrc: Oral Oral  Oral  SpO2: 94% 95%  95%  Weight:      Height:      PainSc:   0-No pain  Intake/Output Summary (Last 24 hours) at 08/23/2020 1309 Last data filed at 08/23/2020 0900 Gross per 24 hour  Intake 340 ml  Output 1900 ml  Net -1560 ml   Filed Weights   08/18/20 2230 08/22/20 0900  Weight: (!) 212 kg (!) 234.5 kg   Weight change:   Intake/Output from previous day: 05/17 0701 - 05/18 0700 In: 940 [P.O.:940] Out: 1900 [Urine:1900] Intake/Output this shift: Total I/O In: 120 [P.O.:120] Out: -  Filed Weights   08/18/20 2230 08/22/20 0900  Weight: (!) 212 kg (!) 234.5 kg    Examination: General exam: AAOX3, morbidly obese not in distress, older than stated age, weak appearing. HEENT:Oral mucosa moist, Ear/Nose WNL grossly,dentition normal. Respiratory system: bilaterally diminished,NO crackles no use of accessory muscle, non tender. Cardiovascular system: S1 & S2 +,No JVD. Gastrointestinal system: Abdomen soft obese  obese abdomen w/ thick pannus,NT,ND, BS+. Nervous System:Alert, awake, moving extremities Extremities: no edema, distal peripheral pulses palpable.  Skin: No rashes,no icterus. MSK: Normal muscle bulk,tone, power  Data Reviewed: I have personally reviewed following labs and imaging studies CBC: Recent Labs  Lab 08/18/20 1936 08/18/20 2050 08/19/20 0208 08/21/20 0147  WBC 12.4*  --  8.2 7.6  NEUTROABS 9.2*  --   --   --   HGB 9.2* 11.2* 7.8* 7.8*  HCT 33.5* 33.0* 28.5* 28.2*  MCV 80.0  --  79.2* 79.9*  PLT 363  --  361 [id number]   Basic Metabolic Panel: Recent Labs  Lab 08/18/20 1936 08/18/20 2050 08/19/20 0208 08/21/20 0147  NA  136 140 136 134*  K 3.7 3.7 3.4* 3.5  CL 103 104 105 103  CO2 21*  --  22 26  GLUCOSE 78 72 77 79  BUN 11 12 11 7   CREATININE 1.97* 2.00* 1.77* 1.43*  CALCIUM 8.8*  --  8.8* 8.8*   GFR: Estimated Creatinine Clearance: 101.3 mL/min (A) (by C-G formula based on SCr of 1.43 mg/dL (H)). Liver Function Tests: Recent Labs  Lab 08/18/20 1936 08/19/20 0208  AST 25 20  ALT 24 21  ALKPHOS 64 60  BILITOT 0.6 0.4  PROT 7.0 6.2*  ALBUMIN 2.3* 2.1*   No results for input(s): LIPASE, AMYLASE in the last 168 hours. No results for input(s): AMMONIA in the last 168 hours. Coagulation Profile: Recent Labs  Lab 08/18/20 1936  INR 1.1   Cardiac Enzymes: Recent Labs  Lab 08/18/20 1936  CKTOTAL 46   BNP (last 3 results) No results for input(s): PROBNP in the last 8760 hours. HbA1C: No results for input(s): HGBA1C in the last 72 hours. CBG: Recent Labs  Lab 08/17/20 1139 08/17/20 1706 08/17/20 2102 08/18/20 0805 08/18/20 1159  GLUCAP 90 104* 90 78 94   Lipid Profile: No results for input(s): CHOL, HDL, LDLCALC, TRIG, CHOLHDL, LDLDIRECT in the last 72 hours. Thyroid Function Tests: No results for input(s): TSH, T4TOTAL, FREET4, T3FREE, THYROIDAB in the last 72 hours. Anemia Panel: No results for input(s): VITAMINB12, FOLATE, FERRITIN, TIBC, IRON, RETICCTPCT in the last 72 hours. Sepsis Labs: Recent Labs  Lab 08/18/20 1936  LATICACIDVEN 2.6*    Recent Results (from the past 240 hour(s))  Resp Panel by RT-PCR (Flu A&B, Covid) Nasopharyngeal Swab     Status: None   Collection Time: 08/18/20  7:36 PM   Specimen: Nasopharyngeal Swab; Nasopharyngeal(NP) swabs in vial transport medium  Result Value Ref Range Status   SARS Coronavirus 2 by RT PCR NEGATIVE NEGATIVE Final    Comment: (NOTE) SARS-CoV-2 target  nucleic acids are NOT DETECTED.  The SARS-CoV-2 RNA is generally detectable in upper respiratory specimens during the acute phase of infection. The lowest concentration of  SARS-CoV-2 viral copies this assay can detect is 138 copies/mL. A negative result does not preclude SARS-Cov-2 infection and should not be used as the sole basis for treatment or other patient management decisions. A negative result may occur with  improper specimen collection/handling, submission of specimen other than nasopharyngeal swab, presence of viral mutation(s) within the areas targeted by this assay, and inadequate number of viral copies(<138 copies/mL). A negative result must be combined with clinical observations, patient history, and epidemiological information. The expected result is Negative.  Fact Sheet for Patients:  EntrepreneurPulse.com.au  Fact Sheet for Healthcare Providers:  IncredibleEmployment.be  This test is no t yet approved or cleared by the [place] FDA and  has been authorized for detection and/or diagnosis of SARS-CoV-2 by FDA under an Emergency Use Authorization (EUA). This EUA will remain  in effect (meaning this test can be used) for the duration of the COVID-19 declaration under Section 564(b)(1) of the Act, 21 U.S.C.section 360bbb-3(b)(1), unless the authorization is terminated  or revoked sooner.       Influenza A by PCR NEGATIVE NEGATIVE Final   Influenza B by PCR NEGATIVE NEGATIVE Final    Comment: (NOTE) The Xpert Xpress SARS-CoV-2/FLU/RSV plus assay is intended as an aid in the diagnosis of influenza from Nasopharyngeal swab specimens and should not be used as a sole basis for treatment. Nasal washings and aspirates are unacceptable for Xpert Xpress SARS-CoV-2/FLU/RSV testing.  Fact Sheet for Patients: EntrepreneurPulse.com.au  Fact Sheet for Healthcare Providers: IncredibleEmployment.be  This test is not yet approved or cleared by the [place] FDA and has been authorized for detection and/or diagnosis of SARS-CoV-2 by FDA under an Emergency Use  Authorization (EUA). This EUA will remain in effect (meaning this test can be used) for the duration of the COVID-19 declaration under Section 564(b)(1) of the Act, 21 U.S.C. section 360bbb-3(b)(1), unless the authorization is terminated or revoked.  Performed at [facility], [place] [address] [place], [place] [address]   Urine culture     Status: None   Collection Time: 08/18/20  7:36 PM   Specimen: In/Out Cath Urine  Result Value Ref Range Status   Specimen Description IN/OUT CATH URINE  Final   Special Requests NONE  Final   Culture   Final    NO GROWTH Performed at [facility], [place] [address] [place], [place] [address]    Report Status 08/20/2020 FINAL  Final  Blood Culture (routine x 2)     Status: None   Collection Time: 08/18/20  7:53 PM   Specimen: BLOOD  Result Value Ref Range Status   Specimen Description BLOOD RIGHT ARM  Final   Special Requests   Final    BOTTLES DRAWN AEROBIC AND ANAEROBIC Blood Culture results may not be optimal due to an inadequate volume of blood received in culture bottles   Culture   Final    NO GROWTH 5 DAYS Performed at [facility], [place] [address] [place], [place] [address]    Report Status 08/23/2020 FINAL  Final  Blood Culture (routine x 2)     Status: None   Collection Time: 08/18/20  8:38 PM   Specimen: BLOOD RIGHT HAND  Result Value Ref Range Status   Specimen Description BLOOD RIGHT HAND  Final   Special Requests  Final    BOTTLES DRAWN AEROBIC AND ANAEROBIC Blood Culture results may not be optimal due to an inadequate volume of blood received in culture bottles   Culture   Final    NO GROWTH 5 DAYS Performed at [facility], [place] [address] [place], [place] [address]    Report Status 08/23/2020 FINAL  Final     Radiology Studies: No results found.   LOS: 4 days   Antonieta Pert, MD [facility]  08/23/2020, 1:09 PM

## 2020-08-23 NOTE — Progress Notes (Signed)
Physical Therapy Treatment Patient Details Name: Gracelee Stemmler Robinson MRN: [id number] DOB: February 11, 1976 Today's Date: 08/23/2020    History of Present Illness 45 y.o. female admitted to ED for hypotension with fall with concerning features for possible developing sepsis. Head Chest abdomen pelvis CT with no acute findings. Of note pt with recent hospitalization for UTI and multiple wounds with/ pancolitis found to have renal faliure and rhabdomyolsis. PMH includes morbid obesity, obstructive sleep apnea, COPD, anxiety and depression, diabetes mellitus type 2, bipolar disorder, renal mass, restless leg syndrome and numerous small wounds under her pannus, upper thighs and between her legs.    PT Comments    Pt supine in bed on arrival. Voiced need to have BM.  Pt continues to benefit from aggressive rehab at d/c.  Pt is improving with each visit and has great rehab potential.     Follow Up Recommendations  CIR     Equipment Recommendations  Wheelchair (measurements PT);Wheelchair cushion (measurements PT) (bariatric)    Recommendations for Other Services       Precautions / Restrictions Precautions Precautions: Fall Precaution Comments: multiple wounds in skin folds. 2 falls in the last two weeks prior two admission on 08/18/20.  Reports knees buckle. Restrictions Weight Bearing Restrictions: No    Mobility  Bed Mobility Overal bed mobility: Needs Assistance Bed Mobility: Supine to Sit     Supine to sit: Min guard Sit to supine: Min assist (for LEs)   General bed mobility comments: Min guard with more effort on patient's behalf this session.  Able to maintain balance sitting edge of bed.    Transfers Overall transfer level: Needs assistance Equipment used: Rolling walker (2 wheeled) Transfers: Sit to/from Stand Sit to Stand: Min assist Stand pivot transfers: Min assist;+2 physical assistance       General transfer comment: Cues for hand placement to and from seated surface  edge of bed and commode.  Ambulation/Gait Ambulation/Gait assistance: Min assist Gait Distance (Feet): 10 Feet (x2 seated rest break on commode.) Assistive device: Rolling walker (2 wheeled) Gait Pattern/deviations: Step-to pattern;Antalgic;Wide base of support;Trunk flexed;Decreased stride length     General Gait Details: Slow waddling pattern with cues for safety with turns and backing.  Poor endurance remains but required decreased assistance this session.   Stairs             Wheelchair Mobility    Modified Rankin (Stroke Patients Only)       Balance Overall balance assessment: Needs assistance Sitting-balance support: No upper extremity supported;Feet supported Sitting balance-Leahy Scale: Good     Standing balance support: Bilateral upper extremity supported Standing balance-Leahy Scale: Poor Standing balance comment: reliant on UE support and additional external support                            Cognition Arousal/Alertness: Awake/alert Behavior During Therapy: WFL for tasks assessed/performed Overall Cognitive Status: Within Functional Limits for tasks assessed                                 General Comments: Needs encouragement.      Exercises General Exercises - Lower Extremity Ankle Circles/Pumps: AROM;Both;20 reps;Seated Long Arc Quad: AROM;Both;10 reps;Seated Hip ABduction/ADduction: AROM;Both;10 reps;Seated Hip Flexion/Marching: AROM;Both;10 reps;Seated    General Comments        Pertinent Vitals/Pain Pain Assessment: No/denies pain    Home Living  Prior Function            PT Goals (current goals can now be found in the care plan section) Acute Rehab PT Goals Patient Stated Goal: to go to rehab and return to prior level of function Potential to Achieve Goals: Good Progress towards PT goals: Progressing toward goals    Frequency    Min 3X/week      PT Plan Current  plan remains appropriate (patient had BM informed NT and informed RN via secure chat)    Co-evaluation              AM-PAC PT "6 Clicks" Mobility   Outcome Measure  Help needed turning from your back to your side while in a flat bed without using bedrails?: A Little Help needed moving from lying on your back to sitting on the side of a flat bed without using bedrails?: A Little Help needed moving to and from a bed to a chair (including a wheelchair)?: A Little Help needed standing up from a chair using your arms (e.g., wheelchair or bedside chair)?: A Little Help needed to walk in hospital room?: A Little Help needed climbing 3-5 steps with a railing? : A Lot 6 Click Score: 17    End of Session Equipment Utilized During Treatment: Gait belt Activity Tolerance: Patient limited by fatigue Patient left: in chair;with call bell/phone within reach;with chair alarm set Nurse Communication: Mobility status;Need for lift equipment       Time: [phone] PT Time Calculation (min) (ACUTE ONLY): 33 min  Charges:  $Gait Training: 8-22 mins $Therapeutic Exercise: 8-22 mins                     Erasmo Leventhal , PTA Acute Rehabilitation Services Pager [phone] Office [place] 08/23/2020, 4:34 PM

## 2020-08-23 NOTE — Plan of Care (Signed)
  Problem: Fluid Volume: Goal: Hemodynamic stability will improve Outcome: Progressing   Problem: Clinical Measurements: Goal: Diagnostic test results will improve Outcome: Progressing Goal: Signs and symptoms of infection will decrease Outcome: Progressing   Problem: Respiratory: Goal: Ability to maintain adequate ventilation will improve Outcome: Progressing   Problem: Education: Goal: Knowledge of General Education information will improve Description: Including pain rating scale, medication(s)/side effects and non-pharmacologic comfort measures Outcome: Progressing   Problem: Health Behavior/Discharge Planning: Goal: Ability to manage health-related needs will improve Outcome: Progressing   Problem: Activity: Goal: Risk for activity intolerance will decrease Outcome: Progressing   Problem: Nutrition: Goal: Adequate nutrition will be maintained Outcome: Progressing   Problem: Coping: Goal: Level of anxiety will decrease Outcome: Progressing   Problem: Elimination: Goal: Will not experience complications related to bowel motility Outcome: Progressing Goal: Will not experience complications related to urinary retention Outcome: Progressing   Problem: Pain Managment: Goal: General experience of comfort will improve Outcome: Progressing   Problem: Safety: Goal: Ability to remain free from injury will improve Outcome: Progressing   Problem: Skin Integrity: Goal: Risk for impaired skin integrity will decrease Outcome: Progressing   

## 2020-08-23 NOTE — Progress Notes (Signed)
Bedside shift report complete. Received patient awake,alert/orientedx4 and able to verbalize needs. NAD noted; respirations easy/even on room air. Movement/ sensation to all extremities noted. Tele monitor on. Multiple small wounds noted under pannus, upper thighs and between legs. Whiteboard updated. All safety measures in place and personal belongings within reach.

## 2020-08-23 NOTE — Progress Notes (Signed)
Inpatient Rehab Admissions Coordinator:   Met with patient at bedside to discuss CIR goals and expectations.  We spent a long time discussing decline in mobility over the last few years due to recovery from c-section and transition to working from home/sedentary job with [employer].  She reports she was still able to ambulate in the home without a device by holding onto the furniture, and was mobilizing very slowly.  Her mom is able to help with light tasks and is currently caring for pt's 11-year old daughters.  Pt plans to discharge to a motel, where they are living while they find more permanent housing.  I let her know that her estimated length of stay on CIR would only be about 1.5-2 weeks and her goals would be supervision to mod I for limited mobility (enough to get to/from the car and move around her room).  We can arrange home health from CIR, which is much lower intensity, and she will need to continue to progress her independence on her own after d/c from CIR.  We also discussed needing to ensure that she and her mom were able to care for her wounds in order to prevent infection/readmission.  I do think pt could benefit from CIR program for therapy, nursing, wound care, and education, and she does understand that we are a short term program with the goal of getting patients home quickly, not necessarily returning to 100% of their baseline.  Will plan to discuss with pt's mom and answer any questions she has and follow for potential admission pending bed availability.   Shann Medal, PT, DPT Admissions Coordinator [phone] 08/23/20  3:02 PM

## 2020-08-23 NOTE — Progress Notes (Signed)
Occupational Therapy Treatment Patient Details Name: Heather [name] MRN: [id number] DOB: 1976-02-21 Today's Date: 08/23/2020    History of present illness 45 y.o. female admitted to ED for hypotension with fall with concerning features for possible developing sepsis. Head Chest abdomen pelvis CT with no acute findings. Of note pt with recent hospitalization for UTI and multiple wounds with/ pancolitis found to have renal faliure and rhabdomyolsis. PMH includes morbid obesity, obstructive sleep apnea, COPD, anxiety and depression, diabetes mellitus type 2, bipolar disorder, renal mass, restless leg syndrome and numerous small wounds under her pannus, upper thighs and between her legs.   OT comments  This 45 yo female seen today with focus on bathing and using AE. Pt able to sit EOB and bath UB with Mod A (could do all except under skin folds), could do LB with long handled sponge prn (did not do front or back peri area due to recent change of skin care regimen for these areas), able to use wide sock aid, able to stand with +2 Mod A and step up towards HOB with +2 min A. She will continue to benefit from acute OT with follow up on CIR.  Follow Up Recommendations  CIR    Equipment Recommendations  Tub/shower bench       Precautions / Restrictions Precautions Precautions: Fall Precaution Comments: multiple wounds in skin folds. 2 falls in the last two weeks prior two admission on 08/18/20.  Reports knees buckle. Restrictions Weight Bearing Restrictions: No       Mobility Bed Mobility Overal bed mobility: Needs Assistance Bed Mobility: Supine to Sit;Sit to Supine     Supine to sit: Min guard Sit to supine: Min assist (for LEs)        Transfers Overall transfer level: Needs assistance Equipment used: Rolling walker (2 wheeled) Transfers: Sit to/from [facility] Sit to Stand: Mod assist;+2 physical assistance Stand pivot transfers: Min assist;+2 physical  assistance       General transfer comment: using momentum and rocking with increased time to come to complete standing    Balance Overall balance assessment: Needs assistance Sitting-balance support: No upper extremity supported;Feet supported Sitting balance-Leahy Scale: Good     Standing balance support: Bilateral upper extremity supported Standing balance-Leahy Scale: Poor Standing balance comment: reliant on UE support and additional external support                           ADL either performed or assessed with clinical judgement   ADL Overall ADL's : Needs assistance/impaired           Upper Body Bathing Details (indicate cue type and reason): Can do everything surface area, but needs A for all folds due to body habitus while seated EOB   Lower Body Bathing Details (indicate cue type and reason): A for back and front peri areas (areas had just been cleaned by RN with skin protectants applied so did not have pt attempt to use long handled sponge or toilet wide with wash cloth for these areas), pt able to wash upper legs by hand and lower legs and feet with long handled sponge     Lower Body Dressing: Minimal assistance Lower Body Dressing Details (indicate cue type and reason): sitting EOB using wide sock aid to donn socks Toilet Transfer: Moderate assistance;+2 for physical assistance Toilet Transfer Details (indicate cue type and reason): sit<>stand from bed using rocking motion for momentum and RW to then side  step up towards [place] Vision/History: No visual deficits            Cognition Arousal/Alertness: Awake/alert Behavior During Therapy: WFL for tasks assessed/performed Overall Cognitive Status: Within Functional Limits for tasks assessed                                                     Pertinent Vitals/ Pain       Pain Assessment: No/denies pain         Frequency  Min 2X/week         Progress Toward Goals  OT Goals(current goals can now be found in the care plan section)  Progress towards OT goals: Progressing toward goals  Acute Rehab OT Goals Patient Stated Goal: to go to rehab and return to prior level of function OT Goal Formulation: With patient Time For Goal Achievement: 09/04/20 Potential to Achieve Goals: [place] Discharge plan remains appropriate       AM-PAC OT "6 Clicks" Daily Activity     Outcome Measure   Help from another person eating meals?: None Help from another person taking care of personal grooming?: A Little Help from another person toileting, which includes using toliet, bedpan, or urinal?: Total Help from another person bathing (including washing, rinsing, drying)?: A Lot Help from another person to put on and taking off regular upper body clothing?: A Little Help from another person to put on and taking off regular lower body clothing?: A Lot 6 Click Score: 15    End of Session Equipment Utilized During Treatment: Rolling walker  OT Visit Diagnosis: History of falling (Z91.81);Other abnormalities of gait and mobility (R26.89);Muscle weakness (generalized) (M62.81)   Activity Tolerance Patient tolerated treatment well   Patient Left in bed;with call bell/phone within reach   Nurse Communication Mobility status        Time: 1749-4496 OT Time Calculation (min): 41 min  Charges: OT General Charges $OT Visit: 1 Visit OT Treatments $Self Care/Home Management : 23-37 mins  [place] [address], OTR/L Acute [employer] Pager [phone] Office [phone]     Almon Register 08/23/2020, 2:03 PM

## 2020-08-24 ENCOUNTER — Other Ambulatory Visit: Payer: Self-pay

## 2020-08-24 ENCOUNTER — Encounter (HOSPITAL_COMMUNITY): Payer: Self-pay | Admitting: Physical Medicine and Rehabilitation

## 2020-08-24 ENCOUNTER — Inpatient Hospital Stay (HOSPITAL_COMMUNITY)
Admission: RE | Admit: 2020-08-24 | Discharge: 2020-09-14 | DRG: 945 | Disposition: A | Discharge status: Home Health | Payer: Medicaid Other | Source: Intra-hospital | Attending: Physical Medicine and Rehabilitation | Admitting: Physical Medicine and Rehabilitation

## 2020-08-24 DIAGNOSIS — Z79899 Other long term (current) drug therapy: Secondary | ICD-10-CM

## 2020-08-24 DIAGNOSIS — K5900 Constipation, unspecified: Secondary | ICD-10-CM | POA: Diagnosis not present

## 2020-08-24 DIAGNOSIS — Z818 Family history of other mental and behavioral disorders: Secondary | ICD-10-CM | POA: Diagnosis not present

## 2020-08-24 DIAGNOSIS — R6510 Systemic inflammatory response syndrome (SIRS) of non-infectious origin without acute organ dysfunction: Secondary | ICD-10-CM | POA: Diagnosis present

## 2020-08-24 DIAGNOSIS — D72829 Elevated white blood cell count, unspecified: Secondary | ICD-10-CM

## 2020-08-24 DIAGNOSIS — N179 Acute kidney failure, unspecified: Secondary | ICD-10-CM | POA: Diagnosis present

## 2020-08-24 DIAGNOSIS — Z833 Family history of diabetes mellitus: Secondary | ICD-10-CM

## 2020-08-24 DIAGNOSIS — K219 Gastro-esophageal reflux disease without esophagitis: Secondary | ICD-10-CM | POA: Diagnosis present

## 2020-08-24 DIAGNOSIS — D631 Anemia in chronic kidney disease: Secondary | ICD-10-CM | POA: Diagnosis present

## 2020-08-24 DIAGNOSIS — I129 Hypertensive chronic kidney disease with stage 1 through stage 4 chronic kidney disease, or unspecified chronic kidney disease: Secondary | ICD-10-CM | POA: Diagnosis present

## 2020-08-24 DIAGNOSIS — Z825 Family history of asthma and other chronic lower respiratory diseases: Secondary | ICD-10-CM | POA: Diagnosis not present

## 2020-08-24 DIAGNOSIS — D509 Iron deficiency anemia, unspecified: Secondary | ICD-10-CM | POA: Diagnosis present

## 2020-08-24 DIAGNOSIS — G8929 Other chronic pain: Secondary | ICD-10-CM

## 2020-08-24 DIAGNOSIS — F319 Bipolar disorder, unspecified: Secondary | ICD-10-CM | POA: Diagnosis present

## 2020-08-24 DIAGNOSIS — F5104 Psychophysiologic insomnia: Secondary | ICD-10-CM | POA: Diagnosis present

## 2020-08-24 DIAGNOSIS — E46 Unspecified protein-calorie malnutrition: Secondary | ICD-10-CM | POA: Diagnosis present

## 2020-08-24 DIAGNOSIS — E1122 Type 2 diabetes mellitus with diabetic chronic kidney disease: Secondary | ICD-10-CM | POA: Diagnosis present

## 2020-08-24 DIAGNOSIS — J449 Chronic obstructive pulmonary disease, unspecified: Secondary | ICD-10-CM | POA: Diagnosis present

## 2020-08-24 DIAGNOSIS — M793 Panniculitis, unspecified: Secondary | ICD-10-CM | POA: Diagnosis not present

## 2020-08-24 DIAGNOSIS — G2581 Restless legs syndrome: Secondary | ICD-10-CM | POA: Diagnosis present

## 2020-08-24 DIAGNOSIS — Z6841 Body Mass Index (BMI) 40.0 and over, adult: Secondary | ICD-10-CM

## 2020-08-24 DIAGNOSIS — Z91040 Latex allergy status: Secondary | ICD-10-CM

## 2020-08-24 DIAGNOSIS — R5381 Other malaise: Principal | ICD-10-CM | POA: Diagnosis present

## 2020-08-24 DIAGNOSIS — Z23 Encounter for immunization: Secondary | ICD-10-CM

## 2020-08-24 DIAGNOSIS — G4700 Insomnia, unspecified: Secondary | ICD-10-CM

## 2020-08-24 DIAGNOSIS — N390 Urinary tract infection, site not specified: Secondary | ICD-10-CM | POA: Diagnosis not present

## 2020-08-24 DIAGNOSIS — N1832 Chronic kidney disease, stage 3b: Secondary | ICD-10-CM | POA: Diagnosis present

## 2020-08-24 DIAGNOSIS — F4310 Post-traumatic stress disorder, unspecified: Secondary | ICD-10-CM

## 2020-08-24 DIAGNOSIS — M25569 Pain in unspecified knee: Secondary | ICD-10-CM

## 2020-08-24 DIAGNOSIS — Z87891 Personal history of nicotine dependence: Secondary | ICD-10-CM

## 2020-08-24 DIAGNOSIS — E871 Hypo-osmolality and hyponatremia: Secondary | ICD-10-CM

## 2020-08-24 DIAGNOSIS — Z881 Allergy status to other antibiotic agents status: Secondary | ICD-10-CM

## 2020-08-24 DIAGNOSIS — Z1629 Resistance to other single specified antibiotic: Secondary | ICD-10-CM | POA: Diagnosis present

## 2020-08-24 DIAGNOSIS — F3189 Other bipolar disorder: Secondary | ICD-10-CM | POA: Diagnosis present

## 2020-08-24 DIAGNOSIS — E7800 Pure hypercholesterolemia, unspecified: Secondary | ICD-10-CM | POA: Diagnosis present

## 2020-08-24 DIAGNOSIS — E66813 Obesity, class 3: Secondary | ICD-10-CM

## 2020-08-24 DIAGNOSIS — Z8249 Family history of ischemic heart disease and other diseases of the circulatory system: Secondary | ICD-10-CM

## 2020-08-24 DIAGNOSIS — Z91030 Bee allergy status: Secondary | ICD-10-CM

## 2020-08-24 DIAGNOSIS — D638 Anemia in other chronic diseases classified elsewhere: Secondary | ICD-10-CM

## 2020-08-24 DIAGNOSIS — E662 Morbid (severe) obesity with alveolar hypoventilation: Secondary | ICD-10-CM | POA: Diagnosis present

## 2020-08-24 DIAGNOSIS — F339 Major depressive disorder, recurrent, unspecified: Secondary | ICD-10-CM

## 2020-08-24 DIAGNOSIS — Z880 Allergy status to penicillin: Secondary | ICD-10-CM

## 2020-08-24 DIAGNOSIS — M545 Low back pain, unspecified: Secondary | ICD-10-CM

## 2020-08-24 DIAGNOSIS — Z809 Family history of malignant neoplasm, unspecified: Secondary | ICD-10-CM

## 2020-08-24 DIAGNOSIS — M549 Dorsalgia, unspecified: Secondary | ICD-10-CM | POA: Diagnosis present

## 2020-08-24 DIAGNOSIS — R112 Nausea with vomiting, unspecified: Secondary | ICD-10-CM

## 2020-08-24 DIAGNOSIS — N2889 Other specified disorders of kidney and ureter: Secondary | ICD-10-CM | POA: Diagnosis present

## 2020-08-24 DIAGNOSIS — R32 Unspecified urinary incontinence: Secondary | ICD-10-CM | POA: Diagnosis present

## 2020-08-24 DIAGNOSIS — Z8371 Family history of colonic polyps: Secondary | ICD-10-CM

## 2020-08-24 LAB — BASIC METABOLIC PANEL
Anion gap: 6 (ref 5–15)
BUN: 10 mg/dL (ref 6–20)
CO2: 26 mmol/L (ref 22–32)
Calcium: 8.9 mg/dL (ref 8.9–10.3)
Chloride: 101 mmol/L (ref 98–111)
Creatinine, Ser: 1.45 mg/dL — ABNORMAL HIGH (ref 0.44–1.00)
GFR, Estimated: 46 mL/min — ABNORMAL LOW (ref >60)
Glucose, Bld: 90 mg/dL (ref 70–99)
Potassium: 3.9 mmol/L (ref 3.5–5.1)
Sodium: 133 mmol/L — ABNORMAL LOW (ref 135–145)

## 2020-08-24 LAB — CBC
HCT: 29.4 % — ABNORMAL LOW (ref 36.0–46.0)
Hemoglobin: 8.3 g/dL — ABNORMAL LOW (ref 12.0–15.0)
MCH: 22.5 pg — ABNORMAL LOW (ref 26.0–34.0)
MCHC: 28.2 g/dL — ABNORMAL LOW (ref 30.0–36.0)
MCV: 79.7 fL — ABNORMAL LOW (ref 80.0–100.0)
Platelets: 412 10*3/uL — ABNORMAL HIGH (ref 150–400)
RBC: 3.69 MIL/uL — ABNORMAL LOW (ref 3.87–5.11)
RDW: 24.8 % — ABNORMAL HIGH (ref 11.5–15.5)
WBC: 6.1 10*3/uL (ref 4.0–10.5)
nRBC: 0 % (ref 0.0–0.2)

## 2020-08-24 MED ORDER — JUVEN PO PACK
1.0000 | PACK | Freq: Three times a day (TID) | ORAL | Status: DC
  Administered 2020-08-24 – 2020-09-03 (×5): 1 via ORAL
  Filled 2020-08-24 (×65): qty 1

## 2020-08-24 MED ORDER — ALUM & MAG HYDROXIDE-SIMETH 200-200-20 MG/5ML PO SUSP: 15.0000 mL | Freq: Four times a day (QID) | ORAL | 0 refills | Status: DC | PRN

## 2020-08-24 MED ORDER — ZINC SULFATE 220 (50 ZN) MG PO CAPS
220.0000 mg | ORAL_CAPSULE | Freq: Every day | ORAL | Status: DC
  Administered 2020-08-24 – 2020-09-14 (×22): 220 mg via ORAL
  Filled 2020-08-24 (×22): qty 1

## 2020-08-24 MED ORDER — ADULT MULTIVITAMIN W/MINERALS CH
1.0000 | ORAL_TABLET | Freq: Every day | ORAL | Status: DC
  Administered 2020-08-24 – 2020-09-14 (×22): 1 via ORAL
  Filled 2020-08-24 (×22): qty 1

## 2020-08-24 MED ORDER — ASCORBIC ACID 500 MG PO TABS
500.0000 mg | ORAL_TABLET | Freq: Every day | ORAL | Status: DC
  Administered 2020-08-24 – 2020-09-14 (×22): 500 mg via ORAL
  Filled 2020-08-24 (×22): qty 1

## 2020-08-24 MED ORDER — BUSPIRONE HCL 5 MG PO TABS
10.0000 mg | ORAL_TABLET | Freq: Two times a day (BID) | ORAL | Status: DC
  Administered 2020-08-24 – 2020-09-14 (×42): 10 mg via ORAL
  Filled 2020-08-24 (×43): qty 2

## 2020-08-24 MED ORDER — ALBUTEROL SULFATE (2.5 MG/3ML) 0.083% IN NEBU: 2.5000 mg | INHALATION_SOLUTION | RESPIRATORY_TRACT | Status: DC | PRN

## 2020-08-24 MED ORDER — SILVER SULFADIAZINE 1 % EX CREA
TOPICAL_CREAM | Freq: Two times a day (BID) | CUTANEOUS | Status: AC
  Administered 2020-08-26 – 2020-08-30 (×2): 1 via TOPICAL
  Filled 2020-08-24 (×2): qty 85

## 2020-08-24 MED ORDER — BISACODYL 10 MG RE SUPP: 10.0000 mg | Freq: Every day | RECTAL | Status: DC | PRN

## 2020-08-24 MED ORDER — PROCHLORPERAZINE MALEATE 5 MG PO TABS
5.0000 mg | ORAL_TABLET | Freq: Four times a day (QID) | ORAL | Status: DC | PRN
  Administered 2020-08-26 – 2020-08-29 (×3): 10 mg via ORAL
  Filled 2020-08-24 (×4): qty 2

## 2020-08-24 MED ORDER — FOLIC ACID 1 MG PO TABS
1.0000 mg | ORAL_TABLET | Freq: Every day | ORAL | Status: DC
  Administered 2020-08-25 – 2020-09-14 (×21): 1 mg via ORAL
  Filled 2020-08-24 (×21): qty 1

## 2020-08-24 MED ORDER — GUAIFENESIN-DM 100-10 MG/5ML PO SYRP: 5.0000 mL | ORAL_SOLUTION | Freq: Four times a day (QID) | ORAL | Status: DC | PRN

## 2020-08-24 MED ORDER — TRAZODONE HCL 50 MG PO TABS: 25.0000 mg | ORAL_TABLET | Freq: Every evening | ORAL | Status: DC | PRN

## 2020-08-24 MED ORDER — FOLIC ACID 1 MG PO TABS: 1.0000 mg | ORAL_TABLET | Freq: Every day | ORAL | 2 refills | Status: DC

## 2020-08-24 MED ORDER — PROCHLORPERAZINE EDISYLATE 10 MG/2ML IJ SOLN: 5.0000 mg | Freq: Four times a day (QID) | INTRAMUSCULAR | Status: DC | PRN

## 2020-08-24 MED ORDER — DULOXETINE HCL 30 MG PO CPEP
60.0000 mg | ORAL_CAPSULE | Freq: Every day | ORAL | Status: DC
  Administered 2020-08-25 – 2020-09-14 (×21): 60 mg via ORAL
  Filled 2020-08-24 (×21): qty 2

## 2020-08-24 MED ORDER — ROPINIROLE HCL 1 MG PO TABS
3.0000 mg | ORAL_TABLET | Freq: Every day | ORAL | Status: DC
  Administered 2020-08-24 – 2020-09-13 (×21): 3 mg via ORAL
  Filled 2020-08-24 (×23): qty 3

## 2020-08-24 MED ORDER — ENOXAPARIN SODIUM 120 MG/0.8ML IJ SOSY
105.0000 mg | PREFILLED_SYRINGE | INTRAMUSCULAR | Status: DC
  Administered 2020-08-24: 105 mg via SUBCUTANEOUS
  Filled 2020-08-24: qty 0.7

## 2020-08-24 MED ORDER — DIPHENHYDRAMINE HCL 12.5 MG/5ML PO ELIX: 12.5000 mg | ORAL_SOLUTION | Freq: Four times a day (QID) | ORAL | Status: DC | PRN

## 2020-08-24 MED ORDER — VITAMIN B-12 1000 MCG PO TABS
5000.0000 ug | ORAL_TABLET | Freq: Every day | ORAL | Status: DC
  Administered 2020-08-25 – 2020-09-14 (×21): 5000 ug via ORAL
  Filled 2020-08-24 (×21): qty 5

## 2020-08-24 MED ORDER — ALUM & MAG HYDROXIDE-SIMETH 200-200-20 MG/5ML PO SUSP
30.0000 mL | ORAL | Status: DC | PRN
  Administered 2020-08-30: 30 mL via ORAL
  Filled 2020-08-24: qty 30

## 2020-08-24 MED ORDER — MONTELUKAST SODIUM 10 MG PO TABS
10.0000 mg | ORAL_TABLET | Freq: Every day | ORAL | Status: DC
  Administered 2020-08-24 – 2020-09-13 (×21): 10 mg via ORAL
  Filled 2020-08-24 (×22): qty 1

## 2020-08-24 MED ORDER — FLEET ENEMA 7-19 GM/118ML RE ENEM: 1.0000 | ENEMA | Freq: Once | RECTAL | Status: DC | PRN

## 2020-08-24 MED ORDER — BUPROPION HCL ER (XL) 300 MG PO TB24
300.0000 mg | ORAL_TABLET | Freq: Every day | ORAL | Status: DC
  Administered 2020-08-25 – 2020-09-14 (×21): 300 mg via ORAL
  Filled 2020-08-24 (×21): qty 1

## 2020-08-24 MED ORDER — PROCHLORPERAZINE 25 MG RE SUPP: 12.5000 mg | Freq: Four times a day (QID) | RECTAL | Status: DC | PRN

## 2020-08-24 MED ORDER — BUSPIRONE HCL 10 MG PO TABS
10.0000 mg | ORAL_TABLET | Freq: Two times a day (BID) | ORAL | Status: DC
Start: 2020-08-24 — End: 2020-09-13

## 2020-08-24 MED ORDER — QUETIAPINE FUMARATE 25 MG PO TABS
25.0000 mg | ORAL_TABLET | Freq: Every day | ORAL | Status: DC
  Administered 2020-08-24: 25 mg via ORAL
  Filled 2020-08-24: qty 1

## 2020-08-24 MED ORDER — ACETAMINOPHEN 325 MG PO TABS
325.0000 mg | ORAL_TABLET | ORAL | Status: DC | PRN
  Administered 2020-08-29 – 2020-09-10 (×6): 650 mg via ORAL
  Filled 2020-08-24 (×6): qty 2

## 2020-08-24 MED ORDER — FLUTICASONE PROPIONATE 50 MCG/ACT NA SUSP
1.0000 | Freq: Every day | NASAL | Status: DC
  Administered 2020-08-25 – 2020-09-14 (×20): 1 via NASAL
  Filled 2020-08-24: qty 16

## 2020-08-24 MED ORDER — PROSOURCE PLUS PO LIQD
30.0000 mL | Freq: Two times a day (BID) | ORAL | Status: DC
  Administered 2020-09-02 – 2020-09-14 (×25): 30 mL via ORAL
  Filled 2020-08-24 (×28): qty 30

## 2020-08-24 MED ORDER — POLYETHYLENE GLYCOL 3350 17 G PO PACK: 17.0000 g | PACK | Freq: Every day | ORAL | Status: DC | PRN

## 2020-08-24 NOTE — Progress Notes (Signed)
RT spoke to patient about wearing CPAP and patient stated it's to uncomfortable for her to wear.  Patient is in no distress at this time. RT advised if she changes mind to advise RN and RT will setup a machine for her.

## 2020-08-24 NOTE — Discharge Summary (Signed)
Physician Discharge Summary  Heather [name] Bia [id number] DOB: 09/26/1975 DOA: 08/18/2020  PCP: Lucianne Lei, MD  Admit date: 08/18/2020 Discharge date: 08/24/2020  Admitted From: home Disposition:  CIR  Recommendations for Outpatient Follow-up:  1. Follow up with PCP in 1-2 weeks 2. Please obtain BMP/CBC in one week 3. Please follow up on the following pending results:  Home Health:NO  Equipment/Devices: NONE  Discharge Condition: Stable Code Status:   Code Status: Full Code Diet recommendation:  Diet Order            Diet - low sodium heart healthy           Diet Heart Room service appropriate? Yes; Fluid consistency: Thin  Diet effective now                  Brief/Interim Summary: 45 year old female with morbid obesity OSA COPD MDD GAD, insomnia and RLS, T2DM renal mass numerous small wounds under her pannus upper thighs and in between her legs who was admitted [facility] from 5/4-5/13 treated for complicated Proteus mirabilis UTI, AKI, traumatic rhabdomyolysis secondary to fall, she improved and recommended that she be transferred to: Inpatient rehab however she decided she would rather go back to rheumatology and while walking in a motor Back home she suddenly felt weak, her right leg gave out and fell to the floor with her back to the hospital, found to be hypotensive, tachycardic Blood pressure was about 81/49, heart rate in low 100s. Her creatinine was 2.00, increased from 1.66 when last checked on 5/11. WBC or mildly elevated at 12.4, increased from 7.2 when last checked on 5/9. Lactic acid was 2.6. UA revealed large leukocytes, negative nitrite, rare bacteria, greater than 50 RBCs and greater than 50 WBCs.She was given 1600 cc normal saline bolus and a gram of Rocephin in the ED.PT OT is working with her CIR advised for further rehabilitation short-term at [facility] and has a bed today and is being discharged there. Discharge Diagnoses:  SIRS vs dehydration with lactic  acidosis, hypotension tachycardia AKI leukocytosis Recent Proteus UTI-treated with 5 days of antibiotics, panniculitis treated with vancomycin and then Zyvox for 7 days total.Hee had blood culture and urine culture negative to date.  5/15 discontinued antibiotics and is doing well off antibiotics.Remains afebrile no leukocytosis.    Fall/deconditioning continue PT OT will benefit with start CIR placement  AKI CKD stage IIIa: Creatinine improved to 1.4, was 2.0.  Encourage oral hydration Recent Labs  Lab 08/18/20 1936 08/18/20 2050 08/19/20 0208 08/21/20 0147 08/24/20 0451  BUN 11 12 11 7 10   CREATININE 1.97* 2.00* 1.77* 1.43* 1.45*   Right renal mass 8.4 cm noted on ultrasound, Previously noted to be around 6 cm & multiseptated on a lumbar MRI in 7/20.Finding of mass was discussed with urology on her last admission- it was recommended that she have an outpatient work-up.he patient states that she was scheduled to see a urologist on 08/16/2020.  She will need to follow-up outpatient-please give urology referral on discharge.  Morbid obesity with BMI 91:Will benefit with aggressive weight loss, healthy lifestyle PCP follow-up sleep apnea evaluation with   OSA- cont cpap. Refusing  Moisture wounds on abdomen thighs and upper leg-seen by [facility] previously-Continue current wound care PT OT rehab.  She has a Foley to prevent contamination of her wounds.  [id number] Requip  Insomnia continue Seroquel  MDD/GAD:  continue HER Wellbutrin, Cymbalta and BuSpar.    Normocytic anemia/folate deficiency likely AOCD: Hemoglobin is stable around 7  to 8 g range.  Anemia panel 5/5 showed folic acid 4.2 ferritin normal B12 was elevated continue folate supplement, monitor CBC Recent Labs  Lab 08/18/20 1936 08/18/20 2050 08/19/20 0208 08/21/20 0147 08/24/20 0451  HGB 9.2* 11.2* 7.8* 7.8* 8.3*  HCT 33.5* 33.0* 28.5* 28.2* 29.4*    Consults:  See notes  Subjective: Aao, resting, no complaints,  voiding well off foleys Discharge Exam: Vitals:   08/24/20 0455 08/24/20 0900  BP: 122/61 116/68  Pulse: 84 91  Resp: 18 19  Temp: 98.2 F (36.8 C) 98.4 F (36.9 C)  SpO2: 95% 94%   General: Pt is alert, awake, not in acute distress Cardiovascular: RRR, S1/S2 +, no rubs, no gallops Respiratory: CTA bilaterally, no wheezing, no rhonchi Abdominal: Soft, NT, ND, bowel sounds + Extremities: no edema, no cyanosis  Discharge Instructions  Discharge Instructions    Diet - low sodium heart healthy   Complete by: As directed    Discharge instructions   Complete by: As directed    He will need to follow-up with urology Dr. For renal mass.  Please call call MD or return to ER for similar or worsening recurring problem that brought you to hospital or if any fever,nausea/vomiting,abdominal pain, uncontrolled pain, chest pain,  shortness of breath or any other alarming symptoms.  Please follow-up your doctor as instructed in a week time and call the office for appointment.  Please avoid alcohol, smoking, or any other illicit substance and maintain healthy habits including taking your regular medications as prescribed.  You were cared for by a hospitalist during your hospital stay. If you have any questions about your discharge medications or the care you received while you were in the hospital after you are discharged, you can call the unit and ask to speak with the hospitalist on call if the hospitalist that took care of you is not available.  Once you are discharged, your primary care physician will handle any further medical issues. Please note that NO REFILLS for any discharge medications will be authorized once you are discharged, as it is imperative that you return to your primary care physician (or establish a relationship with a primary care physician if you do not have one) for your aftercare needs so that they can reassess your need for medications and monitor your lab values    Discharge wound care:   Complete by: As directed    Wound care to abdominal and LE wounds:  Wash twice daily with soap and water, rinse and pat thoroughly dry.  Apply silvadene cream to open lesions, apply antimicrobial wicking textile Phil Dopp, Kellie Simmering # [phone]) Continue antimicrobial wicking fabric for coverage of yeast and moisture management-leaving several inches of fabric to protrude for evaporation of perspiration/moisture. *Ensure that patient is bathed daily and all wound care products are removed completely, that all wounds are allowed to dry and dressings reapplied   Increase activity slowly   Complete by: As directed      Allergies as of 08/24/2020      Reactions   Bee Venom Shortness Of Breath   Penicillins Anaphylaxis   Has patient had a PCN reaction causing immediate rash, facial/tongue/throat swelling, SOB or lightheadedness with hypotension: No Has patient had a PCN reaction causing severe rash involving mucus membranes or skin necrosis: No Has patient had a PCN reaction that required hospitalization No Has patient had a PCN reaction occurring within the last 10 years: No If all of the above answers are "NO", then may  proceed with Cephalosporin use.' REACTION: Angioedema   Penicillin G    Adhesive [tape] Rash   Latex Rash   Vancomycin Other (See Comments)   Pt can tolerate Vancomycin but did cause Vancomycin Infusion Reaction.  Recommend to pre-medicate with Benadryl before doses administered.        Medication List    TAKE these medications   acetaminophen 325 MG tablet Commonly known as: TYLENOL Take 650 mg by mouth every 6 (six) hours as needed for mild pain, fever or headache.   albuterol 108 (90 Base) MCG/ACT inhaler Commonly known as: VENTOLIN HFA INHALE 2 PUFFS INTO THE LUNGS EVERY 4 HOURS AS NEEDED FOR WHEEZING OR SHORTNESS OF BREATH   alum & mag hydroxide-simeth 200-200-20 MG/5ML suspension Commonly known as: MAALOX/MYLANTA Take 15 mLs by mouth every 6  (six) hours as needed for indigestion or heartburn.   buPROPion 300 MG 24 hr tablet Commonly known as: WELLBUTRIN XL TAKE 1 TABLET(300 MG) BY MOUTH DAILY   busPIRone 10 MG tablet Commonly known as: BUSPAR Take 1 tablet (10 mg total) by mouth 2 (two) times daily. What changed:   See the new instructions.  Another medication with the same name was removed. Continue taking this medication, and follow the directions you see here.   DULoxetine 30 MG capsule Commonly known as: Cymbalta Take 2 capsules (60 mg total) by mouth daily.   Flovent HFA 110 MCG/ACT inhaler Generic drug: fluticasone Inhale 2 puffs into the lungs 2 (two) times daily.   fluticasone 50 MCG/ACT nasal spray Commonly known as: FLONASE Place 1 spray into both nostrils in the morning and at bedtime.   folic acid 1 MG tablet Commonly known as: FOLVITE Take 1 tablet (1 mg total) by mouth daily.   montelukast 10 MG tablet Commonly known as: SINGULAIR Take 1 tablet (10 mg total) by mouth at bedtime.   nystatin powder Commonly known as: MYCOSTATIN/NYSTOP Apply 1 application topically 3 (three) times daily. What changed:   when to take this  reasons to take this   QUEtiapine 25 MG tablet Commonly known as: SEROQUEL Take 1 tablet (25 mg total) by mouth at bedtime. What changed: Another medication with the same name was removed. Continue taking this medication, and follow the directions you see here.   rOPINIRole 3 MG tablet Commonly known as: REQUIP TAKE 1 TABLET(3 MG) BY MOUTH AT BEDTIME   silver sulfADIAZINE 1 % cream Commonly known as: SILVADENE Apply to affected area daily   Vitamin B-12 5000 MCG Tbdp Take 5,000 mcg by mouth daily.   Vitamin D3 125 MCG (5000 UT) Caps Take 10,000 Units by mouth daily.   vitamin E 180 MG (400 UNITS) capsule Take 400 Units by mouth daily.            Discharge Care Instructions  (From admission, onward)         Start     Ordered   08/24/20 0000   Discharge wound care:       Comments: Wound care to abdominal and LE wounds:  Wash twice daily with soap and water, rinse and pat thoroughly dry.  Apply silvadene cream to open lesions, apply antimicrobial wicking textile Phil Dopp, Kellie Simmering # [phone]) Continue antimicrobial wicking fabric for coverage of yeast and moisture management-leaving several inches of fabric to protrude for evaporation of perspiration/moisture. *Ensure that patient is bathed daily and all wound care products are removed completely, that all wounds are allowed to dry and dressings reapplied   08/24/20 1031  Allergies  Allergen Reactions  . Bee Venom Shortness Of Breath  . Penicillins Anaphylaxis    Has patient had a PCN reaction causing immediate rash, facial/tongue/throat swelling, SOB or lightheadedness with hypotension: No Has patient had a PCN reaction causing severe rash involving mucus membranes or skin necrosis: No Has patient had a PCN reaction that required hospitalization No Has patient had a PCN reaction occurring within the last 10 years: No If all of the above answers are "NO", then may proceed with Cephalosporin use.'  REACTION: Angioedema  . Penicillin G   . Adhesive [Tape] Rash  . Latex Rash  . Vancomycin Other (See Comments)    Pt can tolerate Vancomycin but did cause Vancomycin Infusion Reaction.  Recommend to pre-medicate with Benadryl before doses administered.      The results of significant diagnostics from this hospitalization (including imaging, microbiology, ancillary and laboratory) are listed below for reference.    Microbiology: Recent Results (from the past 240 hour(s))  Resp Panel by RT-PCR (Flu A&B, Covid) Nasopharyngeal Swab     Status: None   Collection Time: 08/18/20  7:36 PM   Specimen: Nasopharyngeal Swab; Nasopharyngeal(NP) swabs in vial transport medium  Result Value Ref Range Status   SARS Coronavirus 2 by RT PCR NEGATIVE NEGATIVE Final    Comment:  (NOTE) SARS-CoV-2 target nucleic acids are NOT DETECTED.  The SARS-CoV-2 RNA is generally detectable in upper respiratory specimens during the acute phase of infection. The lowest concentration of SARS-CoV-2 viral copies this assay can detect is 138 copies/mL. A negative result does not preclude SARS-Cov-2 infection and should not be used as the sole basis for treatment or other patient management decisions. A negative result may occur with  improper specimen collection/handling, submission of specimen other than nasopharyngeal swab, presence of viral mutation(s) within the areas targeted by this assay, and inadequate number of viral copies(<138 copies/mL). A negative result must be combined with clinical observations, patient history, and epidemiological information. The expected result is Negative.  Fact Sheet for Patients:  BloggerCourse.comhttps://www.fda.gov/media/152166/download  Fact Sheet for Healthcare Providers:  SeriousBroker.ithttps://www.fda.gov/media/152162/download  This test is no t yet approved or cleared by the [place] FDA and  has been authorized for detection and/or diagnosis of SARS-CoV-2 by FDA under an Emergency Use Authorization (EUA). This EUA will remain  in effect (meaning this test can be used) for the duration of the COVID-19 declaration under Section 564(b)(1) of the Act, 21 U.S.C.section 360bbb-3(b)(1), unless the authorization is terminated  or revoked sooner.       Influenza A by PCR NEGATIVE NEGATIVE Final   Influenza B by PCR NEGATIVE NEGATIVE Final    Comment: (NOTE) The Xpert Xpress SARS-CoV-2/FLU/RSV plus assay is intended as an aid in the diagnosis of influenza from Nasopharyngeal swab specimens and should not be used as a sole basis for treatment. Nasal washings and aspirates are unacceptable for Xpert Xpress SARS-CoV-2/FLU/RSV testing.  Fact Sheet for Patients: BloggerCourse.comhttps://www.fda.gov/media/152166/download  Fact Sheet for Healthcare  Providers: SeriousBroker.ithttps://www.fda.gov/media/152162/download  This test is not yet approved or cleared by the [place] FDA and has been authorized for detection and/or diagnosis of SARS-CoV-2 by FDA under an Emergency Use Authorization (EUA). This EUA will remain in effect (meaning this test can be used) for the duration of the COVID-19 declaration under Section 564(b)(1) of the Act, 21 U.S.C. section 360bbb-3(b)(1), unless the authorization is terminated or revoked.  Performed at [facility], [address] [place], [place] [address]   Urine culture  Status: None   Collection Time: 08/18/20  7:36 PM   Specimen: In/Out Cath Urine  Result Value Ref Range Status   Specimen Description IN/OUT CATH URINE  Final   Special Requests NONE  Final   Culture   Final    NO GROWTH Performed at [facility], [place] [address] [place], [place] [address]    Report Status 08/20/2020 FINAL  Final  Blood Culture (routine x 2)     Status: None   Collection Time: 08/18/20  7:53 PM   Specimen: BLOOD  Result Value Ref Range Status   Specimen Description BLOOD RIGHT ARM  Final   Special Requests   Final    BOTTLES DRAWN AEROBIC AND ANAEROBIC Blood Culture results may not be optimal due to an inadequate volume of blood received in culture bottles   Culture   Final    NO GROWTH 5 DAYS Performed at [facility], [place] [address] [place], [place] [address]    Report Status 08/23/2020 FINAL  Final  Blood Culture (routine x 2)     Status: None   Collection Time: 08/18/20  8:38 PM   Specimen: BLOOD RIGHT HAND  Result Value Ref Range Status   Specimen Description BLOOD RIGHT HAND  Final   Special Requests   Final    BOTTLES DRAWN AEROBIC AND ANAEROBIC Blood Culture results may not be optimal due to an inadequate volume of blood received in culture bottles   Culture   Final    NO GROWTH 5 DAYS Performed at [facility], [place] [address], [place], [place] [address]    Report Status  08/23/2020 FINAL  Final    Procedures/Studies: DG Lumbar Spine 2-3 Views  Result Date: 08/10/2020 CLINICAL DATA:  Fall from bed 08/08/2020, late on floor and fell tonight, states hips hurt, completely immobile and weighs 400 pounds EXAM: LUMBAR SPINE - 2-3 VIEW COMPARISON:  MR 11/04/2018, radiograph 06/02/2014 FINDINGS: Exam quality is significantly diminished by patient body habitus which results in for contrast resolution. Redemonstrated grade 1 anterolisthesis L5 on S1 with probable pars defects. Posterior wedging of the L5 vertebrae is noted with up to 50% height loss which is similar to most recent lumbar MR imaging. No clear acute fracture or traumatic malalignment. Additional multilevel discogenic and facet degenerative changes throughout the included thoracolumbar levels, maximal L5-S1. IMPRESSION: Suboptimal imaging quality due to body habitus. No clear acute fracture or traumatic listhesis. Chronic grade 1 anterolisthesis L5 on S1 with probable pars defects. Posterior wedging deformity L5 with 50% height loss posteriorly, unchanged from comparison. Electronically Signed   By: Lovena Le M.D.   On: 08/10/2020 01:37   DG Pelvis 1-2 Views  Result Date: 08/10/2020 CLINICAL DATA:  Fall from bed 08/08/2020, immobile since fall EXAM: PELVIS - 1-2 VIEW COMPARISON:  Contemporary lumbar radiographs. FINDINGS: Suboptimal imaging quality due to patient body habitus. No clear acute fracture or traumatic osseous injury of the bony pelvis or proximal femora. Mild degenerative changes in the hips and pelvis. For findings in the lumbar spine, please see dedicated radiographs. IMPRESSION: Suboptimal imaging quality due to body habitus. No clear acute fracture or traumatic malalignment. Degenerative changes in the hips and pelvis. Dedicated lumbar spine imaging performed concurrently. Electronically Signed   By: Lovena Le M.D.   On: 08/10/2020 01:40   CT Head Wo Contrast  Result Date: 08/18/2020 CLINICAL  DATA:  Fall at 0 tell today due to weakness striking head. Discharged today for treatment  of abscess and wound care. EXAM: CT HEAD WITHOUT CONTRAST TECHNIQUE: Contiguous axial images were obtained from the base of the skull through the vertex without intravenous contrast. COMPARISON:  None. FINDINGS: Brain: Generalized atrophy is advanced for age. No hemorrhage. No evidence of acute ischemia. No hydrocephalus. No midline shift or mass effect. Prominence of subdural and subarachnoid spaces at the convexities felt to be did atrophy. Vascular: No hyperdense vessel. Skull: No fracture or focal lesion. Sinuses/Orbits: Opacification of left side of sphenoid sinus. Scattered mucous retention cysts in the maxillary sinuses. Opacification of right greater than left mastoid air cells. No acute orbital findings. Other: None. IMPRESSION: 1. No acute intracranial abnormality. No skull fracture. 2. Generalized atrophy, advanced for age. Electronically Signed   By: Keith Rake M.D.   On: 08/18/2020 19:52   CT Cervical Spine Wo Contrast  Result Date: 08/18/2020 CLINICAL DATA:  Fall at home tail today due to weakness striking head. EXAM: CT CERVICAL SPINE WITHOUT CONTRAST TECHNIQUE: Multidetector CT imaging of the cervical spine was performed without intravenous contrast. Multiplanar CT image reconstructions were also generated. COMPARISON:  None. FINDINGS: Assessment from C4-C5 through the cervicothoracic junction is extremely limited and near nondiagnostic due to attenuation from habitus. Alignment: Mild scoliotic curvature of the lower cervical spine. No gross listhesis or traumatic subluxation. Skull base and vertebrae: No evidence of fracture. Non fusion posterior and anterior arch of C1, variant anatomy. Lower cervical spine is not well assessed. Soft tissues and spinal canal: No evidence of canal hematoma. No obvious prevertebral soft tissue edema, limited assessment. Disc levels: Suspected disc space narrowing at  C5-C6 and C6-C7, not well assessed. Upper chest: Assessed on concurrent chest CT, reported separately. Other: Morbid obesity. IMPRESSION: 1. No evidence of acute fracture or subluxation of the cervical spine. 2. Assessment from C4-C5 through the cervicothoracic junction is extremely limited and near nondiagnostic due to attenuation from habitus. Electronically Signed   By: Keith Rake M.D.   On: 08/18/2020 19:54   US RENAL  Result Date: 08/14/2020 CLINICAL DATA:  Acute kidney injury. EXAM: RENAL / URINARY TRACT ULTRASOUND COMPLETE COMPARISON:  Abdomen ultrasound, [id number]. FINDINGS: Right Kidney: Renal measurements: 18.1 x 6.2 x 6.8 cm = volume: 402 mL. Normal parenchymal echogenicity. Oval mass arises from the upper pole enlarging the overall length of the kidney. Mass measures 8.4 x 7.3 x 7.7 cm. Mass has internal echoes, but a thin wall and increased through transmission of the sound beam with no definitive internal blood flow on color Doppler. There are shadowing collecting system stones, largest measuring 1.2 cm. No hydronephrosis. Left Kidney: Renal measurements: 13.4 x 6.6 x 5.2 cm = volume: 238 mL. Normal parenchymal echogenicity. Shadowing stones, largest measuring 1.9 cm. No masses. No hydronephrosis. Bladder: Decompressed with a Foley catheter. Other: None. IMPRESSION: 1. 8.4 cm mass arises from the upper pole of the right kidney. Although this may reflect a cyst, it is not fully defined/characterized and could be a solid mass. Recommend follow-up renal MRI without and with contrast. This mass was not evident on the previous ultrasound. 2. Bilateral nonobstructing intrarenal stones. 3. No hydronephrosis.  No acute finding. Electronically Signed   By: Lajean Manes M.D.   On: 08/14/2020 08:19   CT L-SPINE NO CHARGE  Result Date: 08/18/2020 CLINICAL DATA:  Initial evaluation for acute trauma, fall. EXAM: CT LUMBAR SPINE WITHOUT CONTRAST TECHNIQUE: Multidetector CT imaging of the lumbar spine was  performed without intravenous contrast administration. Multiplanar CT image reconstructions were also generated. COMPARISON:  Prior MRI from 11/04/2018. FINDINGS: Segmentation: Examination markedly limited by motion artifact and body habitus, with poor evaluation particularly at the lumbosacral junction. Standard segmentation. Lowest well-formed disc space labeled the L5-S1 level. Alignment: Exaggeration of the normal lumbar lordosis. No interval listhesis or malalignment. Vertebrae: Vertebral body heights grossly maintained. There is question of an irregular lucency at the level of the lumbosacral junction (series 4, image 53). While this finding may simply be artifactual nature, a possible fracture is difficult to exclude on this technically limited exam. No other visible acute fracture. Visualized sacrum and pelvis otherwise intact. No discrete osseous lesions. Paraspinal and other soft tissues: No visible paraspinous soft tissue abnormality. Secreted contrast material present within the renal collecting systems bilaterally. Disc levels: Moderate multilevel degenerative spondylosis is similar to previous MRI. Advanced facet arthropathy present within the lower lumbar spine. No obvious high-grade spinal stenosis. IMPRESSION: 1. Markedly limited exam due to motion artifact and body habitus. Apparent irregular lucency at the level of the lumbosacral junction could simply be artifactual nature, although a possible fracture is difficult to exclude on this technically limited exam. Correlation with physical exam for possible pain at this location recommended. Further assessment with dedicated MRI would be recommended as clinically warranted. 2. No other acute traumatic injury within the lumbar spine. 3. Moderate to advanced multilevel degenerative spondylosis and facet arthropathy, grossly similar to previous MRI. Electronically Signed   By: Jeannine Boga M.D.   On: 08/18/2020 19:59   DG Chest Port 1  View  Result Date: 08/18/2020 CLINICAL DATA:  Chest pain EXAM: PORTABLE CHEST 1 VIEW COMPARISON:  Aug 10, 2020 FINDINGS: Lungs are clear. Heart size and pulmonary vascularity are normal. No adenopathy. No pneumothorax. No bone lesions. IMPRESSION: Lungs clear.  Cardiac silhouette normal. Electronically Signed   By: Lowella Grip III M.D.   On: 08/18/2020 18:12   DG Chest Portable 1 View  Result Date: 08/10/2020 CLINICAL DATA:  Shortness of breath EXAM: PORTABLE CHEST 1 VIEW COMPARISON:  06/22/2018 FINDINGS: Cardiomegaly accentuated by low lung volumes. Low volume chest with indistinct perihilar interstitial markings. No Kerley lines, effusion, or pneumothorax. IMPRESSION: Limited low volume chest. Possible vascular congestion.  No edema or focal airspace disease. Electronically Signed   By: Monte Fantasia M.D.   On: 08/10/2020 06:20   DG Knee Left Port  Result Date: 08/10/2020 CLINICAL DATA:  Fall. EXAM: PORTABLE LEFT KNEE - 1-2 VIEW COMPARISON:  04/11/2016. FINDINGS: Small bony density noted adjacent to the lateral aspect of the patella. This may represent a patellar fracture fragment, age undetermined. This was not present on prior study of 04/11/2016. Patella is again high-riding. Prominent tricompartment degenerative change again noted. Evaluation for effusion is difficult due to positioning. No prominent knee joint effusion noted. IMPRESSION: 1. Small bony density noted adjacent to the lateral aspect of the patella. This may represent patellar fracture fragment, age undetermined. This was not present on prior study of 04/11/2016. Patella is again high-riding. 2.  Prominent tricompartment degenerative change again noted. Electronically Signed   By: Marcello Moores  Register   On: 08/10/2020 06:12   DG Knee Right Port  Result Date: 08/10/2020 CLINICAL DATA:  Fall. EXAM: PORTABLE RIGHT KNEE - 1-2 VIEW COMPARISON:  06/02/2014. FINDINGS: Bipartite patella again noted. High-riding patella again noted.  Tricompartment degenerative change, most prominent about the lateral compartment again noted. Interim progression of joint space loss from prior exam. No acute bony or joint abnormality. Evaluation for effusions difficult due to positioning. Small knee joint effusion cannot  be excluded. IMPRESSION: 1. Bipartite patella again noted. High-riding patella again noted. Tricompartment degenerative change, most prominent about the lateral compartment again noted. Interim progression of joint space loss from prior exam. 2. No acute bony or joint abnormality. Small knee joint effusion cannot be excluded. Electronically Signed   By: Marcello Moores  Register   On: 08/10/2020 06:21   CT CHEST ABDOMEN PELVIS WO CONTRAST  Result Date: 08/18/2020 CLINICAL DATA:  Fall today after recent discharge from hospital for treatment pannicular abscess and cellulitis. History of chronic renal failure. EXAM: CT CHEST, ABDOMEN AND PELVIS WITHOUT CONTRAST TECHNIQUE: Multidetector CT imaging of the chest, abdomen and pelvis was performed following the standard protocol without IV contrast. COMPARISON:  Radiographs from Aug 10, 2020 and renal ultrasound Aug 14, 2020. FINDINGS: CT CHEST FINDINGS Cardiovascular: No significant vascular findings. Normal heart size. No pericardial effusion. Mediastinum/Nodes: No enlarged mediastinal, hilar, or axillary lymph nodes. Thyroid gland, trachea, and esophagus demonstrate no significant findings. Lungs/Pleura: Lungs are clear. No pleural effusion or pneumothorax. Musculoskeletal: Mild levoconvex curvature of the upper thoracic spine. No acute osseous abnormality visualized. There is significant soft tissue attenuation of photons secondary to patient habitus on today's exam and could not be eliminated. This reduces exam sensitivity and specificity. Within this limitation: CT ABDOMEN PELVIS FINDINGS Hepatobiliary: Unremarkable noncontrast appearance of the hepatic parenchyma. Gallbladder surgically absent. Pancreas:  No acute abnormality within the limitations of the exam. Spleen: No acute abnormality within the limitations of the exam. Adrenals/Urinary Tract: Hypodense 1.7 cm right adrenal nodule, likely an adenoma. Left adrenal glands unremarkable. No hydronephrosis. Heterogeneous 7.8 x 7.6 cm lesion in the upper pole of the right kidney corresponding with the mass seen on prior ultrasound. Bilateral nonobstructive renal stones. Urinary bladder incompletely distended limiting evaluation. Stomach/Bowel: No acute abnormality within the limitations of the exam Vascular/Lymphatic: No acute finding within limitations of the exam. Reproductive: No acute abnormality within limitations of the exam. Other: No abdominal ascites visualized. Musculoskeletal: There is multifocal cortical irregularities involving the lumbar spine for reference on image 112/13, however artifact obscures the lumbar vertebral bodies limiting evaluation. IMPRESSION: 1. No definite evidence of acute traumatic injury to the chest, abdomen, or pelvis within the limitations of this noncontrast examination which is degraded by significant soft tissue photon attenuation. Of note there are multifocal cortical irregularities involving the lumbar spine which is felt to be secondary to artifact, would recommend correlation with point tenderness and if concern for injury lumbar spine radiographs. 2. Heterogeneous 7.8 cm right upper pole renal lesion corresponding with the mass seen on prior ultrasound, and incompletely characterized on this examination. Recommend nonemergent renal MRI with and without contrast for further evaluation. 3. Hypodense 1.7 cm right adrenal nodule, likely benign adrenal adenoma. Electronically Signed   By: Dahlia Bailiff MD   On: 08/18/2020 20:19    Labs: BNP (last 3 results) No results for input(s): BNP in the last 8760 hours. Basic Metabolic Panel: Recent Labs  Lab 08/18/20 1936 08/18/20 2050 08/19/20 0208 08/21/20 0147  08/24/20 0451  NA 136 140 136 134* 133*  K 3.7 3.7 3.4* 3.5 3.9  CL 103 104 105 103 101  CO2 21*  --  22 26 26   GLUCOSE 78 72 77 79 90  BUN 11 12 11 7 10   CREATININE 1.97* 2.00* 1.77* 1.43* 1.45*  CALCIUM 8.8*  --  8.8* 8.8* 8.9   Liver Function Tests: Recent Labs  Lab 08/18/20 1936 08/19/20 0208  AST 25 20  ALT 24 21  ALKPHOS 64 60  BILITOT 0.6 0.4  PROT 7.0 6.2*  ALBUMIN 2.3* 2.1*   No results for input(s): LIPASE, AMYLASE in the last 168 hours. No results for input(s): AMMONIA in the last 168 hours. CBC: Recent Labs  Lab 08/18/20 1936 08/18/20 2050 08/19/20 0208 08/21/20 0147 08/24/20 0451  WBC 12.4*  --  8.2 7.6 6.1  NEUTROABS 9.2*  --   --   --   --   HGB 9.2* 11.2* 7.8* 7.8* 8.3*  HCT 33.5* 33.0* 28.5* 28.2* 29.4*  MCV 80.0  --  79.2* 79.9* 79.7*  PLT 363  --  361 358 412*   Cardiac Enzymes: Recent Labs  Lab 08/18/20 1936  CKTOTAL 46   BNP: Invalid input(s): POCBNP CBG: Recent Labs  Lab 08/17/20 1139 08/17/20 1706 08/17/20 2102 08/18/20 0805 08/18/20 1159  GLUCAP 90 104* 90 78 94   D-Dimer No results for input(s): DDIMER in the last 72 hours. Hgb A1c No results for input(s): HGBA1C in the last 72 hours. Lipid Profile No results for input(s): CHOL, HDL, LDLCALC, TRIG, CHOLHDL, LDLDIRECT in the last 72 hours. Thyroid function studies No results for input(s): TSH, T4TOTAL, T3FREE, THYROIDAB in the last 72 hours.  Invalid input(s): FREET3 Anemia work up No results for input(s): VITAMINB12, FOLATE, FERRITIN, TIBC, IRON, RETICCTPCT in the last 72 hours. Urinalysis    Component Value Date/Time   COLORURINE YELLOW 08/18/2020 1936   APPEARANCEUR CLOUDY (A) 08/18/2020 1936   APPEARANCEUR Cloudy (A) 08/06/2016 1209   LABSPEC 1.012 08/18/2020 1936   PHURINE 5.0 08/18/2020 1936   GLUCOSEU NEGATIVE 08/18/2020 1936   HGBUR MODERATE (A) 08/18/2020 1936   HGBUR large 04/19/2008 0952   BILIRUBINUR NEGATIVE 08/18/2020 1936   BILIRUBINUR Negative  08/06/2016 [place] 08/18/2020 1936   PROTEINUR 100 (A) 08/18/2020 1936   UROBILINOGEN 0.2 12/06/2014 2049   NITRITE NEGATIVE 08/18/2020 1936   LEUKOCYTESUR LARGE (A) 08/18/2020 1936   Sepsis Labs Invalid input(s): PROCALCITONIN,  WBC,  LACTICIDVEN Microbiology Recent Results (from the past 240 hour(s))  Resp Panel by RT-PCR (Flu A&B, Covid) Nasopharyngeal Swab     Status: None   Collection Time: 08/18/20  7:36 PM   Specimen: Nasopharyngeal Swab; Nasopharyngeal(NP) swabs in vial transport medium  Result Value Ref Range Status   SARS Coronavirus 2 by RT PCR NEGATIVE NEGATIVE Final    Comment: (NOTE) SARS-CoV-2 target nucleic acids are NOT DETECTED.  The SARS-CoV-2 RNA is generally detectable in upper respiratory specimens during the acute phase of infection. The lowest concentration of SARS-CoV-2 viral copies this assay can detect is 138 copies/mL. A negative result does not preclude SARS-Cov-2 infection and should not be used as the sole basis for treatment or other patient management decisions. A negative result may occur with  improper specimen collection/handling, submission of specimen other than nasopharyngeal swab, presence of viral mutation(s) within the areas targeted by this assay, and inadequate number of viral copies(<138 copies/mL). A negative result must be combined with clinical observations, patient history, and epidemiological information. The expected result is Negative.  Fact Sheet for Patients:  EntrepreneurPulse.com.au  Fact Sheet for Healthcare Providers:  IncredibleEmployment.be  This test is no t yet approved or cleared by the [place] FDA and  has been authorized for detection and/or diagnosis of SARS-CoV-2 by FDA under an Emergency Use Authorization (EUA). This EUA will remain  in effect (meaning this test can be used) for the duration of the COVID-19 declaration under Section 564(b)(1) of the  Act, 21 U.S.C.section 360bbb-3(b)(1), unless the authorization is terminated  or revoked sooner.       Influenza A by PCR NEGATIVE NEGATIVE Final   Influenza B by PCR NEGATIVE NEGATIVE Final    Comment: (NOTE) The Xpert Xpress SARS-CoV-2/FLU/RSV plus assay is intended as an aid in the diagnosis of influenza from Nasopharyngeal swab specimens and should not be used as a sole basis for treatment. Nasal washings and aspirates are unacceptable for Xpert Xpress SARS-CoV-2/FLU/RSV testing.  Fact Sheet for Patients: EntrepreneurPulse.com.au  Fact Sheet for Healthcare Providers: IncredibleEmployment.be  This test is not yet approved or cleared by the [place] FDA and has been authorized for detection and/or diagnosis of SARS-CoV-2 by FDA under an Emergency Use Authorization (EUA). This EUA will remain in effect (meaning this test can be used) for the duration of the COVID-19 declaration under Section 564(b)(1) of the Act, 21 U.S.C. section 360bbb-3(b)(1), unless the authorization is terminated or revoked.  Performed at [facility], [place] [address], [place], [place] [address]   Urine culture     Status: None   Collection Time: 08/18/20  7:36 PM   Specimen: In/Out Cath Urine  Result Value Ref Range Status   Specimen Description IN/OUT CATH URINE  Final   Special Requests NONE  Final   Culture   Final    NO GROWTH Performed at [facility], [place] [address] [place], [place] [address]    Report Status 08/20/2020 FINAL  Final  Blood Culture (routine x 2)     Status: None   Collection Time: 08/18/20  7:53 PM   Specimen: BLOOD  Result Value Ref Range Status   Specimen Description BLOOD RIGHT ARM  Final   Special Requests   Final    BOTTLES DRAWN AEROBIC AND ANAEROBIC Blood Culture results may not be optimal due to an inadequate volume of blood received in culture bottles   Culture   Final    NO GROWTH 5 DAYS Performed at [facility], [place] [address], [place], [place] [address]    Report Status 08/23/2020 FINAL  Final  Blood Culture (routine x 2)     Status: None   Collection Time: 08/18/20  8:38 PM   Specimen: BLOOD RIGHT HAND  Result Value Ref Range Status   Specimen Description BLOOD RIGHT HAND  Final   Special Requests   Final    BOTTLES DRAWN AEROBIC AND ANAEROBIC Blood Culture results may not be optimal due to an inadequate volume of blood received in culture bottles   Culture   Final    NO GROWTH 5 DAYS Performed at [facility], [place] [address], [place],  [address]    Report Status 08/23/2020 FINAL  Final     Time coordinating discharge: 25 minutes  SIGNED: Antonieta Pert, MD  [facility] 08/24/2020, 10:31 AM  If 7PM-7AM, please contact night-coverage [email or web address]

## 2020-08-24 NOTE — Progress Notes (Signed)
Inpatient Rehabilitation Medication Review by a Pharmacist  A complete drug regimen review was completed for this patient to identify any potential clinically significant medication issues.  Clinically significant medication issues were identified:  no  Check AMION for pharmacist assigned to patient if future medication questions/issues arise during this admission.  Pharmacist comments:   Time spent performing this drug regimen review (minutes):  5   Einar Grad 08/24/2020 5:08 PM

## 2020-08-24 NOTE — Progress Notes (Signed)
Inpatient Rehab Admissions Coordinator:   I have a bed available for this patient to admit to CIR today. Dr. Lupita Leash in agreement.  Will let pt/family and TOC team know.   Shann Medal, PT, DPT Admissions Coordinator [phone] 08/24/20  10:52 AM

## 2020-08-24 NOTE — Progress Notes (Signed)
INPATIENT REHABILITATION ADMISSION NOTE   Arrival Method:bed     Mental Orientation:alert   Assessment:done   Skin:see assessment   IV'S:none   Pain:no   Tubes and Drains:none   Safety Measures:done   Vital Signs:done   Height and Weight:done   Rehab Orientation:done   Family:not available    Notes:

## 2020-08-24 NOTE — Progress Notes (Signed)
PMR Admission Coordinator Pre-Admission Assessment   Patient: Heather [name] is an 45 y.o., female MRN: [id number] DOB: [birth year] Height: '5\' 3"'  (160 cm) Weight: (!) 234.5 kg   Insurance Information HMO:     PPO:      PCP:      IPA:      80/20:      OTHER:  PRIMARY: Medicaid (MAD)      Policy#: [id number]                  Subscriber: pt CM Name:       Phone#:      Fax#:  Pre-Cert#: verified eligibility online      Employer:  Benefits:  Phone #:      Name:  Eff. Date: effective as of 08/24/20      Deduct: $0      Out of Pocket Max: $0      Life Max:  CIR:       SNF:  Outpatient:      Co-Pay:  Home Health:       Co-Pay:  DME:      Co-Pay:  Providers:  SECONDARY:       Policy#:      Phone#:    Development worker, community:       Phone#:    The Engineer, petroleum" for patients in Inpatient Rehabilitation Facilities with attached "Privacy Act [place] Records" was provided and verbally reviewed with: N/A   Emergency Contact Information         Contact Information     Name Relation Home Work Mobile    Darrah,Dawn Mother [phone]        Marcha Solders     [phone]         Current Medical History  Patient Admitting Diagnosis: debility   History of Present Illness: Pt is a 45 y/o female with PMH of morbid obesity, COPD, MDD, T2DM, renal mass, and panniculitis, admitted to [facility] on 08/18/2020.  Pt had previous admission to [facility] from 5/4 through 5/13 following a fall with rhabdomyolysis, AKI, and complicated proteus mirabilis UTI.  Pt was discharged home and fell when walking into her motel room and readmitted.  On admit to [facility] pt found to be hypotensive (81/49) and tacchycardic (100s).  Creatine increased to 2 from 1.66 (on 5/11).  WBC mildly elevated at 12/3 and lactic acid 2.6.  Incidental finding of right renal mass, which had increased in size from previous study.  Nephrology recommended outpatient follow up.  Hospital  course wound care for numerous wound to pannus and upper thighs from moisture/friction, gentle hydration, and therapy.  Pt declining cpap for oSA.  Therapy evaluations were completed and pt was recommended for CIR.    Patient's medical record from [place] has been reviewed by the rehabilitation admission coordinator and physician.   Past Medical History  Past Medical History:  Diagnosis Date  . Allergy    . Amenorrhea    . Anemia      post partum   . Anxiety    . Anxiety    . Arthritis    . Asthma    . Back pain    . Constipation    . COPD (chronic obstructive pulmonary disease) ([place])    . Delivery with history of C-section    . Depression    . Depression    . Diabetes mellitus without complication [facility])      patient denies but  states she has hyperglycemia-diet controlled  . Dysmenorrhea    . Dysrhythmia      DR Johnsie Cancel    . Ectopic pregnancy 2013  . Edema, lower extremity    . Eosinophilic esophagitis      Diagnosed at [facility] 06/16/2013, untreated  . Gallbladder problem    . GERD (gastroesophageal reflux disease)      HEARTBURN   TUMS  . Hard to intubate 11/07/2015  . High cholesterol    . IBS (irritable bowel syndrome)    . Leukocytosis 07/28/2008    Qualifier: Diagnosis of  By: Jonna Munro MD, Roderic Scarce    . Morbid obesity ([place])    . Neuromuscular disorder (H[facility])      RESTLESS LEG   . Obesity    . Schizoaffective disorder, bipolar type ([place])    . Sepsis ([place]) 11/11/2014  . Shortness of breath      WITH EXERTION   . Sleep apnea      CPAP- in process of restarting       Family History   family history includes Allergic rhinitis in her sister; Anxiety disorder in her maternal grandmother and mother; Bipolar disorder in her mother; COPD in her maternal grandmother; Cancer in her maternal grandfather; Colon polyps in her maternal grandmother; Crohn's disease in her maternal aunt; Depression in her mother; Diabetes in her maternal grandmother; Eating disorder in her father  and mother; HIV/AIDS in her father; High blood pressure in her mother; Hypertension in her sister; Obesity in her father and mother.   Prior Rehab/Hospitalizations Has the patient had prior rehab or hospitalizations prior to admission? Yes   Has the patient had major surgery during 100 days prior to admission? No               Current Medications   Current Facility-Administered Medications:  .  acetaminophen (TYLENOL) tablet 650 mg, 650 mg, Oral, Q6H PRN, 650 mg at 08/22/20 0901 **OR** acetaminophen (TYLENOL) suppository 650 mg, 650 mg, Rectal, Q6H PRN, Rise Patience, MD .  albuterol (VENTOLIN HFA) 108 (90 Base) MCG/ACT inhaler 2 puff, 2 puff, Inhalation, Q4H PRN, Rise Patience, MD .  alum & mag hydroxide-simeth (MAALOX/MYLANTA) 200-200-20 MG/5ML suspension 15 mL, 15 mL, Oral, Q6H PRN, Rizwan, Saima, MD .  buPROPion (WELLBUTRIN XL) 24 hr tablet 300 mg, 300 mg, Oral, Daily, Rise Patience, MD, 300 mg at 08/24/20 0745 .  busPIRone (BUSPAR) tablet 10 mg, 10 mg, Oral, BID, Rise Patience, MD, 10 mg at 08/24/20 0745 .  Chlorhexidine Gluconate Cloth 2 % PADS 6 each, 6 each, Topical, Daily, Debbe Odea, MD, 6 each at 08/23/20 0946 .  DULoxetine (CYMBALTA) DR capsule 60 mg, 60 mg, Oral, Daily, Rise Patience, MD, 60 mg at 08/24/20 0745 .  enoxaparin (LOVENOX) injection 120 mg, 120 mg, Subcutaneous, Q24H, Kc, Ramesh, MD, 120 mg at 08/23/20 2115 .  fluticasone (FLONASE) 50 MCG/ACT nasal spray 1 spray, 1 spray, Each Nare, Daily, Rise Patience, MD, 1 spray at 08/24/20 0746 .  folic acid (FOLVITE) tablet 1 mg, 1 mg, Oral, Daily, Rizwan, Saima, MD, 1 mg at 08/24/20 0745 .  montelukast (SINGULAIR) tablet 10 mg, 10 mg, Oral, QHS, Rise Patience, MD, 10 mg at 08/23/20 2114 .  QUEtiapine (SEROQUEL) tablet 25 mg, 25 mg, Oral, QHS, Rise Patience, MD, 25 mg at 08/23/20 2114 .  rOPINIRole (REQUIP) tablet 3 mg, 3 mg, Oral, QHS, Rise Patience, MD, 3 mg at  08/23/20 2240 .  silver sulfADIAZINE (SILVADENE) 1 % cream, , Topical, BID, Debbe Odea, MD, Given at 08/24/20 0748 .  vitamin B-12 (CYANOCOBALAMIN) tablet 5,000 mcg, 5,000 mcg, Oral, Daily, Rise Patience, MD, 5,000 mcg at 08/24/20 0745   Patients Current Diet:  Diet Order                      Diet - low sodium heart healthy              Diet Heart Room service appropriate? Yes; Fluid consistency: Thin  Diet effective now                      Precautions / Restrictions Precautions Precautions: Fall Precaution Comments: multiple wounds in skin folds. 2 falls in the last two weeks prior two admission on 08/18/20.  Reports knees buckle. Restrictions Weight Bearing Restrictions: No    Has the patient had 2 or more falls or a fall with injury in the past year? Yes   Prior Activity Level Household: reports slow decline over the last 3 years since her twins were born, able to ambulate in the home (furniture walking), but was moving more slowly.  Works from home.  Not driving.   Prior Functional Level Self Care: Did the patient need help bathing, dressing, using the toilet or eating? Needed some help   Indoor Mobility: Did the patient need assistance with walking from room to room (with or without device)? Needed some help   Stairs: Did the patient need assistance with internal or external stairs (with or without device)? Needed some help   Functional Cognition: Did the patient need help planning regular tasks such as shopping or remembering to take medications? Needed some help   Home Assistive Devices / [place] Devices/Equipment: Gilford Rile (specify type) Home Equipment: Bedside commode,Walker - 2 wheels,Shower seat   Prior Device Use: Indicate devices/aids used by the patient prior to current illness, exacerbation or injury? pt reports she was furniture walking at baseline   Current Functional Level Cognition   Overall Cognitive Status: Within Functional  Limits for tasks assessed Orientation Level: Oriented X4 General Comments: Needs encouragement.    Extremity Assessment (includes Sensation/Coordination)   Upper Extremity Assessment: Generalized weakness,Overall WFL for tasks assessed  Lower Extremity Assessment: Defer to PT evaluation     ADLs   Overall ADL's : Needs assistance/impaired Eating/Feeding: Set up,Bed level Grooming: Wash/dry hands,Wash/dry face,Sitting,Set up Upper Body Bathing: Moderate assistance,Sitting Upper Body Bathing Details (indicate cue type and reason): Can do everything surface area, but needs A for all folds due to body habitus while seated EOB Lower Body Bathing: Total assistance,Sit to/from stand Lower Body Bathing Details (indicate cue type and reason): A for back and front peri areas (areas had just been cleaned by RN with skin protectants applied so did not have pt attempt to use long handled sponge or toilet wide with wash cloth for these areas), pt able to wash upper legs by hand and lower legs and feet with long handled sponge Upper Body Dressing : Minimal assistance,Sitting Lower Body Dressing: Minimal assistance Lower Body Dressing Details (indicate cue type and reason): sitting EOB using wide sock aid to donn socks Toilet Transfer: Moderate assistance,+2 for physical assistance Toilet Transfer Details (indicate cue type and reason): sit<>stand from bed using rocking motion for momentum and RW to then side step up towards [place] and Hygiene: Total assistance,Sit to/from stand General ADL Comments: pt declined OOB to  chair due to discomfort in chair, requested bariatric BSC from unit secretary     Mobility   Overal bed mobility: Needs Assistance Bed Mobility: Supine to Sit Supine to sit: Min guard Sit to supine: Min assist (for LEs) General bed mobility comments: Min guard with more effort on patient's behalf this session.  Able to maintain balance sitting edge of bed.      Transfers   Overall transfer level: Needs assistance Equipment used: Rolling walker (2 wheeled) Transfers: Sit to/from Stand Sit to Stand: Min assist Stand pivot transfers: Min assist,+2 physical assistance General transfer comment: Cues for hand placement to and from seated surface edge of bed and commode.     Ambulation / Gait / Stairs / Wheelchair Mobility   Ambulation/Gait Ambulation/Gait assistance: Herbalist (Feet): 10 Feet (x2 seated rest break on commode.) Assistive device: Rolling walker (2 wheeled) Gait Pattern/deviations: Step-to pattern,Antalgic,Wide base of support,Trunk flexed,Decreased stride length General Gait Details: Slow waddling pattern with cues for safety with turns and backing.  Poor endurance remains but required decreased assistance this session. Gait velocity: decreased Gait velocity interpretation: <1.31 ft/sec, indicative of household ambulator     Posture / Balance Balance Overall balance assessment: Needs assistance Sitting-balance support: No upper extremity supported,Feet supported Sitting balance-Leahy Scale: Good Standing balance support: Bilateral upper extremity supported Standing balance-Leahy Scale: Poor Standing balance comment: reliant on UE support and additional external support     Special needs/care consideration CPAP, Skin multiple wounds to pannus and upper thighs/groin and Diabetic management yes    Previous Home Environment (from acute therapy documentation) Living Arrangements: Parent,Children (99 year old twins) Available Help at Discharge: Family,Available 24 hours/day Type of Home: Other(Comment) (motel) Home Layout: One level Home Access: Level entry Bathroom Shower/Tub: Chiropodist: Standard Home Care Services: No   Discharge Living Setting Plans for Discharge Living Setting: Lives with (comment) (pt's mom ([place]) and pt's 61 y/o twins) Type of Home at Discharge: Other (Comment)  (motel) Discharge Home Layout: One level Discharge Home Access: Level entry Discharge Bathroom Shower/Tub: Tub/shower unit Discharge Bathroom Toilet: Standard Discharge Bathroom Accessibility: No (would not likely be able to get bari equipment in bathroom.) Does the patient have any problems obtaining your medications?: No   Social/Family/Support Systems Patient Roles: Parent (3 y/o daughters (twins)) Anticipated Caregiver: mom: Dawn Darrah Anticipated Ambulance person Information: [phone] Ability/Limitations of Caregiver: supervision only Caregiver Availability: 24/7 Discharge Plan Discussed with Primary Caregiver: Yes Is Caregiver In Agreement with Plan?: Yes Does Caregiver/Family have Issues with Lodging/Transportation while Pt is in Rehab?: No   Goals Patient/Family Goal for Rehab: PT/OT mod I, SLP n/a Expected length of stay: 8-11 days Additional Information: plan to d/c back to motel 6 while they look for permanent housing Pt/Family Agrees to Admission and willing to participate: Yes Program Orientation Provided & Reviewed with Pt/Caregiver Including Roles  & Responsibilities: Yes  Barriers to Discharge: Weight,Insurance for SNF coverage,Home environment access/layout,Wound Care   Decrease burden of Care through IP rehab admission: n/a   Possible need for SNF placement upon discharge: Not anticipated.    Patient Condition: I have reviewed medical records from [facility], spoken with CM, and patient. I met with patient at the bedside for inpatient rehabilitation assessment.  Patient will benefit from ongoing PT and OT, can actively participate in 3 hours of therapy a day 5 days of the week, and can make measurable gains during the admission.  Patient will also benefit from the coordinated team approach  during an Inpatient Acute Rehabilitation admission.  The patient will receive intensive therapy as well as Rehabilitation physician, nursing, social worker, and care  management interventions.  Due to safety, skin/wound care, disease management, pain management and patient education the patient requires 24 hour a day rehabilitation nursing.  The patient is currently min assist with mobility and basic ADLs.  Discharge setting and therapy post discharge at home with home health is anticipated.  Patient has agreed to participate in the Acute Inpatient Rehabilitation Program and will admit today.   Preadmission Screen Completed By:  Michel Santee, PT, DPT 08/24/2020 11:00 AM ______________________________________________________________________   Discussed status with Dr. Dagoberto Ligas on 08/24/20  at 11:12 AM  and received approval for admission today.   Admission Coordinator:  Michel Santee, PT, DPT time 11:12 AM Sudie Grumbling 08/24/20     Assessment/Plan: Diagnosis: 1. Does the need for [place], 24 hr/day Medical supervision in concert with the patient's rehab needs make it unreasonable for this patient to be served in a less intensive setting? Yes 2. Co-Morbidities requiring supervision/potential complications: Super morbid obesity- BMI 91; OSA, COPD, DM, CAD, RLS; as well as debility 3. Due to bladder management, bowel management, safety, skin/wound care, disease management, medication administration, pain management and patient education, does the patient require 24 hr/day rehab nursing? Yes 4. Does the patient require coordinated care of a physician, rehab nurse, PT, OT, and SLP to address physical and functional deficits in the context of the above medical diagnosis(es)? Yes Addressing deficits in the following areas: balance, endurance, locomotion, strength, transferring, bowel/bladder control, bathing, dressing, feeding, grooming and toileting 5. Can the patient actively participate in an intensive therapy program of at least 3 hrs of therapy 5 days a week? Yes 6. The potential for patient to make measurable gains while on inpatient rehab is fair 7. Anticipated  functional outcomes upon discharge from inpatient rehab: supervision PT, supervision OT, n/a SLP 8. Estimated rehab length of stay to reach the above functional goals is: 8-11 days 9. Anticipated discharge destination: Home 10. Overall Rehab/Functional Prognosis: fair     MD Signature:

## 2020-08-24 NOTE — Progress Notes (Signed)
Per report wound care done priorto patient admission to rehab; noted cream and interdry on patients abdominal area.

## 2020-08-24 NOTE — PMR Pre-admission (Signed)
PMR Admission Coordinator Pre-Admission Assessment   Patient: Heather [name] is an 44 y.o., female MRN: [id number] DOB: 03/22/1976 Height: 5' 3" (160 cm) Weight: (!) 234.5 kg   Insurance Information HMO:     PPO:      PCP:      IPA:      80/20:      OTHER:  PRIMARY: Medicaid (MAD)      Policy#: [id number]                  Subscriber: pt CM Name:       Phone#:      Fax#:  Pre-Cert#: verified eligibility online      Employer:  Benefits:  Phone #:      Name:  Eff. Date: effective as of 08/24/20      Deduct: $0      Out of Pocket Max: $0      Life Max:  CIR:       SNF:  Outpatient:      Co-Pay:  Home Health:       Co-Pay:  DME:      Co-Pay:  Providers:  SECONDARY:       Policy#:      Phone#:    Financial Counselor:       Phone#:    The "Data Collection Information Summary" for patients in Inpatient Rehabilitation Facilities with attached "Privacy Act Statement-Health Care Records" was provided and verbally reviewed with: N/A   Emergency Contact Information         Contact Information     Name Relation Home Work Mobile    Darrah,Dawn Mother [phone]        Broadnax,Jamal Friend     [phone]         Current Medical History  Patient Admitting Diagnosis: debility   History of Present Illness: Pt is a 44 y/o female with PMH of morbid obesity, COPD, MDD, T2DM, renal mass, and panniculitis, admitted to [facility] on 08/18/2020.  Pt had previous admission to [facility] from 5/4 through 5/13 following a fall with rhabdomyolysis, AKI, and complicated proteus mirabilis UTI.  Pt was discharged home and fell when walking into her motel room and readmitted.  On admit to [facility] pt found to be hypotensive (81/49) and tacchycardic (100s).  Creatine increased to 2 from 1.66 (on 5/11).  WBC mildly elevated at 12/3 and lactic acid 2.6.  Incidental finding of right renal mass, which had increased in size from previous study.  Nephrology recommended outpatient follow up.  Hospital  course wound care for numerous wound to pannus and upper thighs from moisture/friction, gentle hydration, and therapy.  Pt declining cpap for oSA.  Therapy evaluations were completed and pt was recommended for CIR.    Patient's medical record from [facility] has been reviewed by the rehabilitation admission coordinator and physician.   Past Medical History  Past Medical History:  Diagnosis Date  . Allergy    . Amenorrhea    . Anemia      post partum   . Anxiety    . Anxiety    . Arthritis    . Asthma    . Back pain    . Constipation    . COPD (chronic obstructive pulmonary disease) (HCC)    . Delivery with history of C-section    . Depression    . Depression    . Diabetes mellitus without complication (HCC)      patient denies but   reflux disease)    HEARTBURN   TUMS  . Hard to intubate 11/07/2015  . High cholesterol   . IBS (irritable bowel syndrome)   . Leukocytosis 07/28/2008   Qualifier: Diagnosis of  By: Jonna Munro MD, Roderic Scarce    . Morbid obesity ([place])   . Neuromuscular disorder (HCC)    RESTLESS LEG   . Obesity   . Schizoaffective disorder, bipolar type ([place])   . Sepsis ([place]) 11/11/2014  . Shortness of breath    WITH EXERTION   . Sleep apnea    CPAP- in process of restarting     Family History   family history includes Allergic rhinitis in her sister; Anxiety disorder in her maternal grandmother and mother; Bipolar disorder in her mother; COPD in her maternal grandmother; Cancer in her maternal grandfather; Colon polyps in her maternal grandmother; Crohn's disease in her maternal aunt; Depression in her mother; Diabetes in her maternal grandmother; Eating disorder in her father and mother; HIV/AIDS in her father; High blood pressure in her mother;  Hypertension in her sister; Obesity in her father and mother.  Prior Rehab/Hospitalizations Has the patient had prior rehab or hospitalizations prior to admission? Yes  Has the patient had major surgery during 100 days prior to admission? No   Current Medications  Current Facility-Administered Medications:  .  acetaminophen (TYLENOL) tablet 650 mg, 650 mg, Oral, Q6H PRN, 650 mg at 08/22/20 0901 **OR** acetaminophen (TYLENOL) suppository 650 mg, 650 mg, Rectal, Q6H PRN, Rise Patience, MD .  albuterol (VENTOLIN HFA) 108 (90 Base) MCG/ACT inhaler 2 puff, 2 puff, Inhalation, Q4H PRN, Rise Patience, MD .  alum & mag hydroxide-simeth (MAALOX/MYLANTA) 200-200-20 MG/5ML suspension 15 mL, 15 mL, Oral, Q6H PRN, Rizwan, Saima, MD .  buPROPion (WELLBUTRIN XL) 24 hr tablet 300 mg, 300 mg, Oral, Daily, Rise Patience, MD, 300 mg at 08/24/20 0745 .  busPIRone (BUSPAR) tablet 10 mg, 10 mg, Oral, BID, Rise Patience, MD, 10 mg at 08/24/20 0745 .  Chlorhexidine Gluconate Cloth 2 % PADS 6 each, 6 each, Topical, Daily, Debbe Odea, MD, 6 each at 08/23/20 0946 .  DULoxetine (CYMBALTA) DR capsule 60 mg, 60 mg, Oral, Daily, Rise Patience, MD, 60 mg at 08/24/20 0745 .  enoxaparin (LOVENOX) injection 120 mg, 120 mg, Subcutaneous, Q24H, Kc, Ramesh, MD, 120 mg at 08/23/20 2115 .  fluticasone (FLONASE) 50 MCG/ACT nasal spray 1 spray, 1 spray, Each Nare, Daily, Rise Patience, MD, 1 spray at 08/24/20 0746 .  folic acid (FOLVITE) tablet 1 mg, 1 mg, Oral, Daily, Rizwan, Saima, MD, 1 mg at 08/24/20 0745 .  montelukast (SINGULAIR) tablet 10 mg, 10 mg, Oral, QHS, Rise Patience, MD, 10 mg at 08/23/20 2114 .  QUEtiapine (SEROQUEL) tablet 25 mg, 25 mg, Oral, QHS, Rise Patience, MD, 25 mg at 08/23/20 2114 .  rOPINIRole (REQUIP) tablet 3 mg, 3 mg, Oral, QHS, Rise Patience, MD, 3 mg at 08/23/20 2240 .  silver sulfADIAZINE (SILVADENE) 1 % cream, , Topical, BID, Debbe Odea, MD, Given at 08/24/20 0748 .  vitamin B-12 (CYANOCOBALAMIN) tablet 5,000 mcg, 5,000 mcg, Oral, Daily, Rise Patience, MD, 5,000 mcg at 08/24/20 0745  Patients Current Diet:  Diet Order            Diet - low sodium heart healthy           Diet Heart Room service appropriate? Yes; Fluid consistency: Thin  Diet effective now  silver sulfADIAZINE (SILVADENE) 1 % cream, , Topical, BID, Rizwan, Saima, MD, Given at 08/24/20 0748 .  vitamin B-12 (CYANOCOBALAMIN) tablet 5,000 mcg, 5,000 mcg, Oral, Daily, Kakrakandy, Arshad N, MD, 5,000 mcg at 08/24/20 0745   Patients Current Diet:  Diet Order                      Diet - low sodium heart healthy              Diet Heart Room service appropriate? Yes; Fluid consistency: Thin  Diet effective now                      Precautions / Restrictions Precautions Precautions: Fall Precaution Comments: multiple wounds in skin folds. 2 falls in the last two weeks prior two admission on 08/18/20.  Reports knees buckle. Restrictions Weight Bearing Restrictions: No    Has the patient had 2 or more falls or a fall with injury in the past year? Yes   Prior Activity Level Household: reports slow decline over the last 3 years since her twins were born, able to ambulate in the home (furniture walking), but was moving more slowly.  Works from home.  Not driving.   Prior Functional Level Self Care: Did the patient need help bathing, dressing, using the toilet or eating? Needed some help   Indoor Mobility: Did the patient need assistance with walking from room to room (with or without device)? Needed some help   Stairs: Did the patient need assistance with internal or external stairs (with or without device)? Needed some help   Functional Cognition: Did the patient need help planning regular tasks such as shopping or remembering to take medications? Needed some help   Home Assistive Devices / Equipment Home Assistive Devices/Equipment: Walker (specify type) Home Equipment: Bedside commode,Walker - 2 wheels,Shower seat   Prior Device Use: Indicate devices/aids used by the patient prior to current illness, exacerbation or injury? pt reports she was furniture walking at baseline   Current Functional Level Cognition   Overall Cognitive Status: Within Functional  Limits for tasks assessed Orientation Level: Oriented X4 General Comments: Needs encouragement.    Extremity Assessment (includes Sensation/Coordination)   Upper Extremity Assessment: Generalized weakness,Overall WFL for tasks assessed  Lower Extremity Assessment: Defer to PT evaluation     ADLs   Overall ADL's : Needs assistance/impaired Eating/Feeding: Set up,Bed level Grooming: Wash/dry hands,Wash/dry face,Sitting,Set up Upper Body Bathing: Moderate assistance,Sitting Upper Body Bathing Details (indicate cue type and reason): Can do everything surface area, but needs A for all folds due to body habitus while seated EOB Lower Body Bathing: Total assistance,Sit to/from stand Lower Body Bathing Details (indicate cue type and reason): A for back and front peri areas (areas had just been cleaned by RN with skin protectants applied so did not have pt attempt to use long handled sponge or toilet wide with wash cloth for these areas), pt able to wash upper legs by hand and lower legs and feet with long handled sponge Upper Body Dressing : Minimal assistance,Sitting Lower Body Dressing: Minimal assistance Lower Body Dressing Details (indicate cue type and reason): sitting EOB using wide sock aid to donn socks Toilet Transfer: Moderate assistance,+2 for physical assistance Toilet Transfer Details (indicate cue type and reason): sit<>stand from bed using rocking motion for momentum and RW to then side step up towards HOB Toileting- Clothing Manipulation and Hygiene: Total assistance,Sit to/from stand General ADL Comments: pt declined OOB to   chair due to discomfort in chair, requested bariatric BSC from unit secretary     Mobility   Overal bed mobility: Needs Assistance Bed Mobility: Supine to Sit Supine to sit: Min guard Sit to supine: Min assist (for LEs) General bed mobility comments: Min guard with more effort on patient's behalf this session.  Able to maintain balance sitting edge of bed.      Transfers   Overall transfer level: Needs assistance Equipment used: Rolling walker (2 wheeled) Transfers: Sit to/from Stand Sit to Stand: Min assist Stand pivot transfers: Min assist,+2 physical assistance General transfer comment: Cues for hand placement to and from seated surface edge of bed and commode.     Ambulation / Gait / Stairs / Wheelchair Mobility   Ambulation/Gait Ambulation/Gait assistance: Min assist Gait Distance (Feet): 10 Feet (x2 seated rest break on commode.) Assistive device: Rolling walker (2 wheeled) Gait Pattern/deviations: Step-to pattern,Antalgic,Wide base of support,Trunk flexed,Decreased stride length General Gait Details: Slow waddling pattern with cues for safety with turns and backing.  Poor endurance remains but required decreased assistance this session. Gait velocity: decreased Gait velocity interpretation: <1.31 ft/sec, indicative of household ambulator     Posture / Balance Balance Overall balance assessment: Needs assistance Sitting-balance support: No upper extremity supported,Feet supported Sitting balance-Leahy Scale: Good Standing balance support: Bilateral upper extremity supported Standing balance-Leahy Scale: Poor Standing balance comment: reliant on UE support and additional external support     Special needs/care consideration CPAP, Skin multiple wounds to pannus and upper thighs/groin and Diabetic management yes    Previous Home Environment (from acute therapy documentation) Living Arrangements: Parent,Children (3 year old twins) Available Help at Discharge: Family,Available 24 hours/day Type of Home: Other(Comment) (motel) Home Layout: One level Home Access: Level entry Bathroom Shower/Tub: Tub/shower unit Bathroom Toilet: Standard Home Care Services: No   Discharge Living Setting Plans for Discharge Living Setting: Lives with (comment) (pt's mom (Dawn) and pt's 3 y/o twins) Type of Home at Discharge: Other (Comment)  (motel) Discharge Home Layout: One level Discharge Home Access: Level entry Discharge Bathroom Shower/Tub: Tub/shower unit Discharge Bathroom Toilet: Standard Discharge Bathroom Accessibility: No (would not likely be able to get bari equipment in bathroom.) Does the patient have any problems obtaining your medications?: No   Social/Family/Support Systems Patient Roles: Parent (3 y/o daughters (twins)) Anticipated Caregiver: mom: Dawn Darrah Anticipated Caregiver's Contact Information: [phone] Ability/Limitations of Caregiver: supervision only Caregiver Availability: 24/7 Discharge Plan Discussed with Primary Caregiver: Yes Is Caregiver In Agreement with Plan?: Yes Does Caregiver/Family have Issues with Lodging/Transportation while Pt is in Rehab?: No   Goals Patient/Family Goal for Rehab: PT/OT mod I, SLP n/a Expected length of stay: 8-11 days Additional Information: plan to d/c back to motel [address] while they look for permanent housing Pt/Family Agrees to Admission and willing to participate: Yes Program Orientation Provided & Reviewed with Pt/Caregiver Including Roles  & Responsibilities: Yes  Barriers to Discharge: Weight,Insurance for SNF coverage,Home environment access/layout,Wound Care   Decrease burden of Care through IP rehab admission: n/a   Possible need for SNF placement upon discharge: Not anticipated.    Patient Condition: I have reviewed medical records from [facility], spoken with CM, and patient. I met with patient at the bedside for inpatient rehabilitation assessment.  Patient will benefit from ongoing PT and OT, can actively participate in 3 hours of therapy a day 5 days of the week, and can make measurable gains during the admission.  Patient will also benefit from the coordinated team approach   need for [place], 24 hr/day Medical supervision in concert with the patient's rehab needs make it unreasonable for this patient to be served in a less intensive setting? Yes 2. Co-Morbidities requiring supervision/potential complications: Super morbid obesity- BMI 91; OSA, COPD, DM, CAD, RLS; as well as debility 3. Due to bladder management, bowel management, safety, skin/wound care, disease management, medication administration, pain management and patient education, does the patient require 24 hr/day rehab nursing? Yes 4. Does the patient require coordinated care of a physician, rehab nurse, PT, OT, and SLP to address physical and functional deficits in the context of the above medical diagnosis(es)? Yes Addressing deficits in the following areas: balance, endurance, locomotion, strength, transferring, bowel/bladder control, bathing, dressing, feeding, grooming and toileting 5. Can the patient actively participate in an intensive therapy program of at least 3 hrs of therapy 5 days a week? Yes 6. The potential for patient to make measurable gains while on inpatient rehab is fair 7. Anticipated functional outcomes upon discharge from inpatient rehab: supervision PT, supervision OT, n/a SLP 8. Estimated rehab length of stay to  reach the above functional goals is: 8-11 days 9. Anticipated discharge destination: Home 10. Overall Rehab/Functional Prognosis: fair   MD Signature:

## 2020-08-24 NOTE — H&P (Signed)
Physical Medicine and Rehabilitation Admission H&P        Chief Complaint  Patient presents with  . Weakness      HPI: Heather [name] is a 45 year old female with history of COPD, chronic LBP, super morbid obesity -BMI 91, untreated sleep apnea, insulin resistance,  recent admission 05/04- 05/13 post fall (laid on the floor for 2 days) with UTI, AKI and panniculitis.  Patient decline SNF, was set up at [facility] 6 but fell as RLE gave out while trying to get in, struck her head and was unable to walk. She was readmitted 08/18/20  for work up. CT abdomen/pelvis/head negative for acute changes but she was noted to be hypotensive, had leucocytosis with WBC 12.4 and was started on IV rocephin for SIRS v/s dehydration.    WOC was reconsulted for input on MASD to multiple skin folds, peri area as well as erythema intertrigo and recommended silvadene bid to open areas, interdry to skin folds and bathing of patient at lest daily.   Blood cultures/urine cultures have been negative and leucocytosis has resolved. Antibiotics d/c 05/15 and she has been afebrile. Acute on chronic renal failure has improved with SCr back to baseline.  She has refused CPAP use. Foley was placed to  place to prevent contamination/worsening of wounds and d/c yesterday.  Therapy ongoing and CIR recommended due to weakness and debility.    Pt reports gets dizzy with walking; notes BP this Am was 112/73- used to it being [id number] sytolic.  Resp therapy notes show pt refuses CPAP nightly.  Likes bariatric air mattress- is more comfortable.  LBM yesterday Took out foley- using the bathroom.  Admits that she has urinary incontinence- chronic "sometimes".    Review of Systems  Constitutional: Negative for chills and fever.  HENT: Negative for hearing loss and tinnitus.   Eyes: Negative for blurred vision and double vision.  Respiratory: Negative for cough and shortness of breath.   Cardiovascular: Negative for chest pain and  palpitations.  Gastrointestinal: Negative for abdominal pain, heartburn and nausea.  Genitourinary: Positive for urgency. Negative for dysuria.       Has incontinence -chronic  Musculoskeletal: Positive for back pain and falls.  Skin: Negative for rash.  Neurological: Positive for dizziness (occasionally with quick movments) and weakness. Negative for headaches. Sensory change: RLE>LLE--moves around.  Psychiatric/Behavioral: Negative for depression. The patient has insomnia. The patient is not nervous/anxious.   All other systems reviewed and are negative.         Past Medical History:  Diagnosis Date  . Allergy    . Amenorrhea    . Anemia      post partum   . Anxiety    . Anxiety    . Arthritis    . Asthma    . Back pain    . Constipation    . COPD (chronic obstructive pulmonary disease) ([place])    . Delivery with history of C-section    . Depression    . Depression    . Diabetes mellitus without complication [facility])      patient denies but states she has hyperglycemia-diet controlled  . Dysmenorrhea    . Dysrhythmia      DR Johnsie Cancel    . Ectopic pregnancy 2013  . Edema, lower extremity    . Eosinophilic esophagitis      Diagnosed at [facility] 06/16/2013, untreated  . Gallbladder problem    . GERD (gastroesophageal reflux disease)  HEARTBURN   TUMS  . Hard to intubate 11/07/2015  . High cholesterol    . IBS (irritable bowel syndrome)    . Leukocytosis 07/28/2008    Qualifier: Diagnosis of  By: Jonna Munro MD, Roderic Scarce    . Morbid obesity ([place])    . Neuromuscular disorder (H[facility])      RESTLESS LEG   . Obesity    . Schizoaffective disorder, bipolar type ([place])    . Sepsis ([place]) 11/11/2014  . Shortness of breath      WITH EXERTION   . Sleep apnea      CPAP- in process of restarting            Past Surgical History:  Procedure Laterality Date  . BIOPSY   08/13/2018    Procedure: BIOPSY;  Surgeon: Yetta Flock, MD;  Location: [facility];  Service: Gastroenterology;;   . CESAREAN SECTION MULTI-GESTATIONAL N/A 02/03/2017    Procedure: CESAREAN SECTION MULTI-GESTATIONAL;  Surgeon: Jonnie Kind, MD;  Location: [place];  Service: Obstetrics;  Laterality: N/A;  . CHOLECYSTECTOMY      . COLONOSCOPY WITH PROPOFOL N/A 08/13/2018    Procedure: COLONOSCOPY WITH PROPOFOL;  Surgeon: Yetta Flock, MD;  Location: [facility];  Service: Gastroenterology;  Laterality: N/A;  . DENTAL SURGERY      . ESOPHAGOGASTRODUODENOSCOPY   May 2007    Dr. Gala Romney: Normal esophagus, stomach, D1, D2  . ESOPHAGOGASTRODUODENOSCOPY   06/16/2013    Dr. Carlton Adam, eosinophilic esophagitis, reactive gastropathy, no esophageal dilation  . ESOPHAGOGASTRODUODENOSCOPY (EGD) WITH PROPOFOL N/A 08/13/2018    Procedure: ESOPHAGOGASTRODUODENOSCOPY (EGD) WITH PROPOFOL;  Surgeon: Yetta Flock, MD;  Location: [facility];  Service: Gastroenterology;  Laterality: N/A;  . POLYPECTOMY   08/13/2018    Procedure: POLYPECTOMY;  Surgeon: Yetta Flock, MD;  Location: [facility];  Service: Gastroenterology;;  . TONSILLECTOMY      . TOOTH EXTRACTION   10/28/2011    Procedure: DENTAL RESTORATION/EXTRACTIONS;  Surgeon: Gae Bon, DDS;  Location: [facility] OR;  Service: Oral Surgery;;  . UPPER GASTROINTESTINAL ENDOSCOPY               Family History  Problem Relation Age of Onset  . Depression Mother    . Anxiety disorder Mother    . High blood pressure Mother    . Bipolar disorder Mother    . Eating disorder Mother    . Obesity Mother    . Hypertension Sister    . Allergic rhinitis Sister    . Colon polyps Maternal Grandmother          31s  . Diabetes Maternal Grandmother    . Anxiety disorder Maternal Grandmother    . COPD Maternal Grandmother    . Crohn's disease Maternal Aunt    . Cancer Maternal Grandfather          prostate  . HIV/AIDS Father    . Eating disorder Father    . Obesity Father    . Liver disease Neg Hx    . Angioedema Neg Hx    . Eczema Neg Hx    .  Immunodeficiency Neg Hx    . Asthma Neg Hx    . Urticaria Neg Hx    . Colon cancer Neg Hx    . Esophageal cancer Neg Hx    . Rectal cancer Neg Hx    . Stomach cancer Neg Hx        Social History:  Married but separated. 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 Mother lives with her and helps with 16 year old twins. Home inhabitable and has been set up at [facility] at last admitin a [employer]. reports that she quit smoking about 9 years ago. Her smoking use included cigarettes. She has a 4.00 pack-year smoking history. She has never used smokeless tobacco. She reports that she does not drink alcohol and does not use drugs.      Allergies  Allergen Reactions  . Bee Venom Shortness Of Breath  . Penicillins Anaphylaxis      Has patient had a PCN reaction causing immediate rash, facial/tongue/throat swelling, SOB or lightheadedness with hypotension: No Has patient had a PCN reaction causing severe rash involving mucus membranes or skin necrosis: No Has patient had a PCN reaction that required hospitalization No Has patient had a PCN reaction occurring within the last 10 years: No If all of the above answers are "NO", then may proceed with Cephalosporin use.'   REACTION: Angioedema  . Penicillin G    . Adhesive [Tape] Rash  . Latex Rash  . Vancomycin Other (See Comments)      Pt can tolerate Vancomycin but did cause Vancomycin Infusion Reaction.  Recommend to pre-medicate with Benadryl before doses administered.                 Facility-Administered Medications Prior to Admission  Medication Dose Route Frequency Provider Last Rate Last Admin  . [COMPLETED] ipratropium (ATROVENT) nebulizer solution 0.5 mg  0.5 mg Nebulization Once Orinda Kenner M, MD   0.5 mg at 03/28/15 1725          Medications Prior to Admission  Medication Sig Dispense Refill  . acetaminophen (TYLENOL) 325 MG tablet Take 650 mg by mouth every 6 (six) hours as needed for mild pain, fever or headache.      . albuterol  (VENTOLIN HFA) 108 (90 Base) MCG/ACT inhaler INHALE 2 PUFFS INTO THE LUNGS EVERY 4 HOURS AS NEEDED FOR WHEEZING OR SHORTNESS OF BREATH (Patient taking differently: Inhale 2 puffs into the lungs every 4 (four) hours as needed for wheezing or shortness of breath.) 6.7 g 1  . buPROPion (WELLBUTRIN XL) 300 MG 24 hr tablet TAKE 1 TABLET(300 MG) BY MOUTH DAILY 90 tablet 1  . busPIRone (BUSPAR) 10 MG tablet TAKE 2 TABLETS(20 MG) BY MOUTH TWICE DAILY 360 tablet 0  . Cholecalciferol (VITAMIN D3) 5000 units CAPS Take 10,000 Units by mouth daily.       . Cyanocobalamin (VITAMIN B-12) 5000 MCG TBDP Take 5,000 mcg by mouth daily.      . DULoxetine (CYMBALTA) 30 MG capsule Take 2 capsules (60 mg total) by mouth daily. 180 capsule 1  . fluticasone (FLONASE) 50 MCG/ACT nasal spray Place 1 spray into both nostrils in the morning and at bedtime.      . fluticasone (FLOVENT HFA) 110 MCG/ACT inhaler Inhale 2 puffs into the lungs 2 (two) times daily. 1 Inhaler 5  . montelukast (SINGULAIR) 10 MG tablet Take 1 tablet (10 mg total) by mouth at bedtime. 30 tablet 5  . nystatin (MYCOSTATIN/NYSTOP) powder Apply 1 application topically 3 (three) times daily. (Patient taking differently: Apply 1 application topically 3 (three) times daily as needed (leg/stomach skin fold rashes).) 60 g 0  . QUEtiapine (SEROQUEL) 25 MG tablet Take 25 mg by mouth at bedtime.      Marland Kitchen rOPINIRole (REQUIP) 3 MG tablet TAKE 1 TABLET(3 MG) BY MOUTH AT BEDTIME 90 tablet 0  . silver  sulfADIAZINE (SILVADENE) 1 % cream Apply to affected area daily 50 g 1  . vitamin E 400 UNIT capsule Take 400 Units by mouth daily.      . busPIRone (BUSPAR) 10 MG tablet Take 2 tablets (20 mg total) by mouth 2 (two) times daily. 60 tablet 2  . QUEtiapine (SEROQUEL) 25 MG tablet Take 1 tablet (25 mg total) by mouth at bedtime. 30 tablet 2      Drug Regimen Review  Drug regimen was reviewed and remains appropriate with no significant issues identified   Home: Home  Living Family/patient expects to be discharged to:: Other (Comment) (motel, left her apartment because of bed bugs) Living Arrangements: Parent,Children (39 year old twins) Available Help at Discharge: Family,Available 24 hours/day Type of Home: Other(Comment) (motel) Home Access: Level entry Home Layout: One level Bathroom Shower/Tub: Chiropodist: Standard Home Equipment: Bedside commode,Walker - 2 wheels,Shower seat   Functional History: Prior Function Level of Independence: Needs assistance Gait / Transfers Assistance Needed: pt reports propping her foot on a stool to don socks, dependence in IADL, her mother drives ADL's / Homemaking Assistance Needed: reports being unable to stand for showering Comments: 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  Able to maintain balance sitting edge of bed. Transfers Overall transfer level: Needs assistance Equipment used: Rolling walker (2 wheeled) Transfers: Sit to/from Stand Sit to Stand: Min assist Stand pivot transfers: Min assist,+2 physical assistance General transfer comment: Cues for hand placement to and from seated surface edge of bed and commode. Ambulation/Gait Ambulation/Gait assistance: Min assist Gait Distance (Feet): 10 Feet (x2 seated rest break on commode.) Assistive device: Rolling walker (2 wheeled) Gait Pattern/deviations: Step-to pattern,Antalgic,Wide base of support,Trunk flexed,Decreased stride length General Gait Details: Slow waddling pattern with cues for safety with turns and backing.  Poor endurance remains but required decreased assistance this  session. Gait velocity: decreased Gait velocity interpretation: <1.31 ft/sec, indicative of household ambulator   ADL: ADL Overall ADL's : Needs assistance/impaired Eating/Feeding: Set up,Bed level Grooming: Wash/dry hands,Wash/dry face,Sitting,Set up Upper Body Bathing: Moderate assistance,Sitting Upper Body Bathing Details (indicate cue type and reason): Can do everything surface area, but needs A for all folds due to body habitus while seated EOB Lower Body Bathing: Total assistance,Sit to/from stand Lower Body Bathing Details (indicate cue type and reason): A for back and front peri areas (areas had just been cleaned by RN with skin protectants applied so did not have pt attempt to use long handled sponge or toilet wide with wash cloth for these areas), pt able to wash upper legs by hand and lower legs and feet with long handled sponge Upper Body Dressing : Minimal assistance,Sitting Lower Body Dressing: Minimal assistance Lower Body Dressing Details (indicate cue type and reason): sitting EOB using wide sock aid to donn socks Toilet Transfer: Moderate assistance,+2 for physical assistance Toilet Transfer Details (indicate cue type and reason): sit<>stand from bed using rocking motion for momentum and RW to then side step up towards [place] and Hygiene: Total assistance,Sit to/from stand General ADL Comments: pt declined OOB to chair due to discomfort in chair, requested bariatric BSC from unit secretary  Cognition: Cognition Overall Cognitive Status: Within Functional Limits for tasks assessed Orientation Level: Oriented X4 Cognition Arousal/Alertness: Awake/alert Behavior During Therapy: WFL for tasks assessed/performed Overall Cognitive Status: Within Functional Limits for tasks assessed General Comments: Needs encouragement.   Physical Exam: Blood pressure 116/68, pulse 91, temperature 98.4 F (36.9 C), temperature source Oral, resp. rate 19,  height 5\' 3"  (1.6 m), weight (!) 234.5 kg, SpO2 94 %. Physical Exam Vitals and nursing note reviewed.  Constitutional:      Appearance: Normal appearance. She is obese. She is not ill-appearing.     Comments: Edentulous  Laying in bariatric low air loss mattress/bed; was able to turn over with some assistance x1 person; NAD BMI of 91  HENT:     Head: Normocephalic and atraumatic.     Right Ear: External ear normal.     Left Ear: External ear normal.     Nose: Nose normal. No congestion.     Mouth/Throat:     Mouth: Mucous membranes are dry.     Pharynx: Oropharynx is clear. No oropharyngeal exudate.  Eyes:     General:        Right eye: No discharge.        Left eye: No discharge.     Extraocular Movements: Extraocular movements intact.  Cardiovascular:     Rate and Rhythm: Normal rate and regular rhythm.     Heart sounds: Normal heart sounds. No murmur heard. No gallop.   Pulmonary:     Comments: CTA B/L- no W/R/R- good air movement  Abdominal:     General: There is no distension.     Palpations: There is no mass.     Comments: Protuberant; has large pannus; Soft, NT, ND, (+)BS    Musculoskeletal:     Cervical back: Normal range of motion and neck supple.     Right lower leg: Edema present.     Left lower leg: Edema present.     Comments: UEs 4+/5 in biceps, triceps, WE, grip and finger and was 4-/5 B/L LEs:- HF 4-/5 , KE 4/5 on R and 4+/5 on L; DF and PF 5-/5 B/L   Skin:    General: Skin is warm and dry.     Comments: Pt's buttocks didn't have wounds- healed However has multiple weeping wounds underneath pannus on abdomen and 1 very large plaque appearing lesion- no interdry in middle, just on sides A few small wounds between thighs that were open/weeping, however had multiple wounds that had coalesced on the back of her thighs, near crease of buttocks that were open/weeping/pink- healing better on thighs than under pannus- strong odor of yeast.     Neurological:      Mental Status: She is alert and oriented to person, place, and time.     Comments: Intact to light touch in UEs and LLE; decreased with pins/needles/light touch from mid calf/shin to toes on RLE- otherwise intact  Psychiatric:        Mood and Affect: Mood normal.        Behavior: Behavior normal.        Lab Results Last 48 Hours        Results for orders placed or performed during the hospital encounter of 08/18/20 (from the past 48 hour(s))  Basic metabolic panel     Status: Abnormal    Collection Time: 08/24/20  4:51 AM  Result Value Ref Range    Sodium 133 (L) 135 - 145 mmol/L  Potassium 3.9 3.5 - 5.1 mmol/L    Chloride 101 98 - 111 mmol/L    CO2 26 22 - 32 mmol/L    Glucose, Bld 90 70 - 99 mg/dL      Comment: Glucose reference range applies only to samples taken after fasting for at least 8 hours.    BUN 10 6 - 20 mg/dL    Creatinine, Ser 1.45 (H) 0.44 - 1.00 mg/dL    Calcium 8.9 8.9 - 10.3 mg/dL    GFR, Estimated 46 (L) >60 mL/min      Comment: (NOTE) Calculated using the CKD-EPI Creatinine Equation (2021)      Anion gap 6 5 - 15      Comment: Performed at [facility] [address], [place], [place] [address]  CBC     Status: Abnormal    Collection Time: 08/24/20  4:51 AM  Result Value Ref Range    WBC 6.1 4.0 - 10.5 K/uL    RBC 3.69 (L) 3.87 - 5.11 MIL/uL    Hemoglobin 8.3 (L) 12.0 - 15.0 g/dL    HCT 29.4 (L) 36.0 - 46.0 %    MCV 79.7 (L) 80.0 - 100.0 fL    MCH 22.5 (L) 26.0 - 34.0 pg    MCHC 28.2 (L) 30.0 - 36.0 g/dL    RDW 24.8 (H) 11.5 - 15.5 %    Platelets 412 (H) 150 - 400 K/uL    nRBC 0.0 0.0 - 0.2 %      Comment: Performed at [facility], [place] [address], [place], [place] [address]      Imaging Results (Last 48 hours)  No results found.           Medical Problem List and Plan: 1.  Debility secondary to panniculitis and SIRS             -patient may  shower             -ELOS/Goals: 8-`11 days- supervision 2.   Antithrombotics: -DVT/anticoagulation:  Pharmaceutical: Lovenox             -antiplatelet therapy: N/A 3. Chronic back pain/Pain Management: Used Mmobic and robaxin. Continue tylenol prn  4. Mood: LCSW to follow for evaluation and support.              -antipsychotic agents: N/A 5. Neuropsych: This patient is capable of making decisions on her own behalf. 6. Skin/Wound Care: Routine pressure relief measures. Foley removed- is voiding             --Continue Silvadene and Interdry per WOC recommendations.              --is Malnourished with albumin 2.1.  Will add collagen and protein to promote wound healing             --Add vitamin C, Zinc and multivitamin as supplements.   7. Fluids/Electrolytes/Nutrition: Monitor I/O. Check lytes in am.  8. Acute on chronic renal failure: Improved from 2.0-->1.45 9. Hyponatremia: Will continue to monitor. Question dilutional  10. Anemia of chronic diease: Monitor for signs of bleeding. H/H has been 7-8 range.  11. COPD/OHS/OSA: Has declined CPAP use. Encourage pulmonary hygeine. On Singulair. 12. Schizoaffective bipolar d/o: Continue Wellbutrin with cymbalta and buspar..  13. RLS: On Requip.  14. Super Morbid obesity:  Educate on appropriate diet, self care and weight loss to help promote mobility and overall health. 15. Chronic Insomnia: Seroquel started a month ago-caused dizziness- not  working. Will d/w pt in AM what she's tried and see if can try anything.  16. Renal mass: Was set to see MD at [facility] urology--had to be rescheduled.  17. Panniculitis- with open weeping wounds- will con't WOC recs with interdry and wound care- might need some Diflucan to help with yeast overgrowth.  18. Dizziness/lightheadedness- low BP when standing? Will check orthostatics.          Bary Leriche, PA-C 08/24/2020    I have personally performed a face to face diagnostic evaluation of this patient and formulated the key components of the plan.  Additionally, I have  personally reviewed laboratory data, imaging studies, as well as relevant notes and concur with the physician assistant's documentation above.

## 2020-08-24 NOTE — H&P (Signed)
Physical Medicine and Rehabilitation Admission H&P    Chief Complaint  Patient presents with  . Weakness    HPI: Heather [name] is a 45 year old female with history of COPD, chronic LBP, super morbid obesity -BMI 91, untreated sleep apnea, insulin resistance,  recent admission 05/04- 05/13 post fall (laid on the floor for 2 days) with UTI, AKI and panniculitis.  Patient decline SNF, was set up at [facility] but fell as RLE gave out while trying to get in, struck her head and was unable to walk. She was readmitted 08/18/20  for work up. CT abdomen/pelvis/head negative for acute changes but she was noted to be hypotensive, had leucocytosis with WBC 12.4 and was started on IV rocephin for SIRS v/s dehydration.   WOC was reconsulted for input on MASD to multiple skin folds, peri area as well as erythema intertrigo and recommended silvadene bid to open areas, interdry to skin folds and bathing of patient at lest daily.   Blood cultures/urine cultures have been negative and leucocytosis has resolved. Antibiotics d/c 05/15 and she has been afebrile. Acute on chronic renal failure has improved with SCr back to baseline.  She has refused CPAP use. Foley was placed to  place to prevent contamination/worsening of wounds and d/c yesterday.  Therapy ongoing and CIR recommended due to weakness and debility.   Pt reports gets dizzy with walking; notes BP this Am was 112/73- used to it being [id number] sytolic.  Resp therapy notes show pt refuses CPAP nightly.  Likes bariatric air mattress- is more comfortable.  LBM yesterday Took out foley- using the bathroom.  Admits that she has urinary incontinence- chronic "sometimes".   Review of Systems  Constitutional: Negative for chills and fever.  HENT: Negative for hearing loss and tinnitus.   Eyes: Negative for blurred vision and double vision.  Respiratory: Negative for cough and shortness of breath.   Cardiovascular: Negative for chest pain and  palpitations.  Gastrointestinal: Negative for abdominal pain, heartburn and nausea.  Genitourinary: Positive for urgency. Negative for dysuria.       Has incontinence -chronic  Musculoskeletal: Positive for back pain and falls.  Skin: Negative for rash.  Neurological: Positive for dizziness (occasionally with quick movments) and weakness. Negative for headaches. Sensory change: RLE>LLE--moves around.  Psychiatric/Behavioral: Negative for depression. The patient has insomnia. The patient is not nervous/anxious.   All other systems reviewed and are negative.    Past Medical History:  Diagnosis Date  . Allergy   . Amenorrhea   . Anemia    post partum   . Anxiety   . Anxiety   . Arthritis   . Asthma   . Back pain   . Constipation   . COPD (chronic obstructive pulmonary disease) ([place])   . Delivery with history of C-section   . Depression   . Depression   . Diabetes mellitus without complication [facility])    patient denies but states she has hyperglycemia-diet controlled  . Dysmenorrhea   . Dysrhythmia    DR Johnsie Cancel    . Ectopic pregnancy 2013  . Edema, lower extremity   . Eosinophilic esophagitis    Diagnosed at [facility] 06/16/2013, untreated  . Gallbladder problem   . GERD (gastroesophageal reflux disease)    HEARTBURN   TUMS  . Hard to intubate 11/07/2015  . High cholesterol   . IBS (irritable bowel syndrome)   . Leukocytosis 07/28/2008   Qualifier: Diagnosis of  By: Jonna Munro MD, Roderic Scarce    .  Morbid obesity ([place])   . Neuromuscular disorder (HCC)    RESTLESS LEG   . Obesity   . Schizoaffective disorder, bipolar type ([place])   . Sepsis ([place]) 11/11/2014  . Shortness of breath    WITH EXERTION   . Sleep apnea    CPAP- in process of restarting     Past Surgical History:  Procedure Laterality Date  . BIOPSY  08/13/2018   Procedure: BIOPSY;  Surgeon: Yetta Flock, MD;  Location: [facility];  Service: Gastroenterology;;  . CESAREAN SECTION MULTI-GESTATIONAL N/A  02/03/2017   Procedure: CESAREAN SECTION MULTI-GESTATIONAL;  Surgeon: Jonnie Kind, MD;  Location: [facility];  Service: Obstetrics;  Laterality: N/A;  . CHOLECYSTECTOMY    . COLONOSCOPY WITH PROPOFOL N/A 08/13/2018   Procedure: COLONOSCOPY WITH PROPOFOL;  Surgeon: Yetta Flock, MD;  Location: [facility];  Service: Gastroenterology;  Laterality: N/A;  . DENTAL SURGERY    . ESOPHAGOGASTRODUODENOSCOPY  May 2007   Dr. Gala Romney: Normal esophagus, stomach, D1, D2  . ESOPHAGOGASTRODUODENOSCOPY  06/16/2013   Dr. Carlton Adam, eosinophilic esophagitis, reactive gastropathy, no esophageal dilation  . ESOPHAGOGASTRODUODENOSCOPY (EGD) WITH PROPOFOL N/A 08/13/2018   Procedure: ESOPHAGOGASTRODUODENOSCOPY (EGD) WITH PROPOFOL;  Surgeon: Yetta Flock, MD;  Location: [facility];  Service: Gastroenterology;  Laterality: N/A;  . POLYPECTOMY  08/13/2018   Procedure: POLYPECTOMY;  Surgeon: Yetta Flock, MD;  Location: [facility];  Service: Gastroenterology;;  . TONSILLECTOMY    . TOOTH EXTRACTION  10/28/2011   Procedure: DENTAL RESTORATION/EXTRACTIONS;  Surgeon: Gae Bon, DDS;  Location[facility];  Service: Oral Surgery;;  . UPPER GASTROINTESTINAL ENDOSCOPY      Family History  Problem Relation Age of Onset  . Depression Mother   . Anxiety disorder Mother   . High blood pressure Mother   . Bipolar disorder Mother   . Eating disorder Mother   . Obesity Mother   . Hypertension Sister   . Allergic rhinitis Sister   . Colon polyps Maternal Grandmother        35s  . Diabetes Maternal Grandmother   . Anxiety disorder Maternal Grandmother   . COPD Maternal Grandmother   . Crohn's disease Maternal Aunt   . Cancer Maternal Grandfather        prostate  . HIV/AIDS Father   . Eating disorder Father   . Obesity Father   . Liver disease Neg Hx   . Angioedema Neg Hx   . Eczema Neg Hx   . Immunodeficiency Neg Hx   . Asthma Neg Hx   . Urticaria Neg Hx   . Colon cancer Neg Hx    . Esophageal cancer Neg Hx   . Rectal cancer Neg Hx   . Stomach cancer Neg Hx     Social History:  Married but separated. 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 Mother lives with her and helps with 53 year old twins. Home inhabitable and has been set up at [facility] at last admitin a [employer]. reports that she quit smoking about 9 years ago. Her smoking use included cigarettes. She has a 4.00 pack-year smoking history. She has never used smokeless tobacco. She reports that she does not drink alcohol and does not use drugs.    Allergies  Allergen Reactions  . Bee Venom Shortness Of Breath  . Penicillins Anaphylaxis    Has patient had a PCN reaction causing immediate rash, facial/tongue/throat swelling, SOB or lightheadedness with hypotension: No Has patient had a PCN reaction causing severe rash  involving mucus membranes or skin necrosis: No Has patient had a PCN reaction that required hospitalization No Has patient had a PCN reaction occurring within the last 10 years: No If all of the above answers are "NO", then may proceed with Cephalosporin use.'  REACTION: Angioedema  . Penicillin G   . Adhesive [Tape] Rash  . Latex Rash  . Vancomycin Other (See Comments)    Pt can tolerate Vancomycin but did cause Vancomycin Infusion Reaction.  Recommend to pre-medicate with Benadryl before doses administered.      Facility-Administered Medications Prior to Admission  Medication Dose Route Frequency Provider Last Rate Last Admin  . [COMPLETED] ipratropium (ATROVENT) nebulizer solution 0.5 mg  0.5 mg Nebulization Once Colonel Bald M, MD   0.5 mg at 03/28/15 1725   Medications Prior to Admission  Medication Sig Dispense Refill  . acetaminophen (TYLENOL) 325 MG tablet Take 650 mg by mouth every 6 (six) hours as needed for mild pain, fever or headache.    . albuterol (VENTOLIN HFA) 108 (90 Base) MCG/ACT inhaler INHALE 2 PUFFS INTO THE LUNGS EVERY 4 HOURS AS NEEDED FOR WHEEZING OR SHORTNESS OF  BREATH (Patient taking differently: Inhale 2 puffs into the lungs every 4 (four) hours as needed for wheezing or shortness of breath.) 6.7 g 1  . buPROPion (WELLBUTRIN XL) 300 MG 24 hr tablet TAKE 1 TABLET(300 MG) BY MOUTH DAILY 90 tablet 1  . busPIRone (BUSPAR) 10 MG tablet TAKE 2 TABLETS(20 MG) BY MOUTH TWICE DAILY 360 tablet 0  . Cholecalciferol (VITAMIN D3) 5000 units CAPS Take 10,000 Units by mouth daily.     . Cyanocobalamin (VITAMIN B-12) 5000 MCG TBDP Take 5,000 mcg by mouth daily.    . DULoxetine (CYMBALTA) 30 MG capsule Take 2 capsules (60 mg total) by mouth daily. 180 capsule 1  . fluticasone (FLONASE) 50 MCG/ACT nasal spray Place 1 spray into both nostrils in the morning and at bedtime.    . fluticasone (FLOVENT HFA) 110 MCG/ACT inhaler Inhale 2 puffs into the lungs 2 (two) times daily. 1 Inhaler 5  . montelukast (SINGULAIR) 10 MG tablet Take 1 tablet (10 mg total) by mouth at bedtime. 30 tablet 5  . nystatin (MYCOSTATIN/NYSTOP) powder Apply 1 application topically 3 (three) times daily. (Patient taking differently: Apply 1 application topically 3 (three) times daily as needed (leg/stomach skin fold rashes).) 60 g 0  . QUEtiapine (SEROQUEL) 25 MG tablet Take 25 mg by mouth at bedtime.    Marland Kitchen rOPINIRole (REQUIP) 3 MG tablet TAKE 1 TABLET(3 MG) BY MOUTH AT BEDTIME 90 tablet 0  . silver sulfADIAZINE (SILVADENE) 1 % cream Apply to affected area daily 50 g 1  . vitamin E 400 UNIT capsule Take 400 Units by mouth daily.    . busPIRone (BUSPAR) 10 MG tablet Take 2 tablets (20 mg total) by mouth 2 (two) times daily. 60 tablet 2  . QUEtiapine (SEROQUEL) 25 MG tablet Take 1 tablet (25 mg total) by mouth at bedtime. 30 tablet 2    Drug Regimen Review  Drug regimen was reviewed and remains appropriate with no significant issues identified  Home: Home Living Family/patient expects to be discharged to:: Other (Comment) (motel, left her apartment because of bed bugs) Living Arrangements:  Parent,Children (43 year old twins) Available Help at Discharge: Family,Available 24 hours/day Type of Home: Other(Comment) (motel) Home Access: Level entry Home Layout: One level Bathroom Shower/Tub: Engineer, manufacturing systems: Standard Home Equipment: Bedside commode,Walker - 2 wheels,Shower seat  Functional History: Prior Function Level of Independence: Needs assistance Gait / Transfers Assistance Needed: pt reports propping her foot on a stool to don socks, dependence in IADL, her mother drives ADL's / Homemaking Assistance Needed: reports being unable to stand for showering Comments: 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  Able to maintain balance sitting edge of bed. Transfers Overall transfer level: Needs assistance Equipment used: Rolling walker (2 wheeled) Transfers: Sit to/from Stand Sit to Stand: Min assist Stand pivot transfers: Min assist,+2 physical assistance General transfer comment: Cues for hand placement to and from seated surface edge of bed and commode. Ambulation/Gait Ambulation/Gait assistance: Min assist Gait Distance (Feet): 10 Feet (x2 seated rest break on commode.) Assistive device: Rolling walker (2 wheeled) Gait Pattern/deviations: Step-to pattern,Antalgic,Wide base of support,Trunk flexed,Decreased stride length General Gait Details: Slow waddling pattern with cues for safety with turns and backing.  Poor endurance remains but required decreased assistance this session. Gait velocity: decreased Gait velocity interpretation: <1.31 ft/sec, indicative of household ambulator    ADL: ADL Overall ADL's : Needs  assistance/impaired Eating/Feeding: Set up,Bed level Grooming: Wash/dry hands,Wash/dry face,Sitting,Set up Upper Body Bathing: Moderate assistance,Sitting Upper Body Bathing Details (indicate cue type and reason): Can do everything surface area, but needs A for all folds due to body habitus while seated EOB Lower Body Bathing: Total assistance,Sit to/from stand Lower Body Bathing Details (indicate cue type and reason): A for back and front peri areas (areas had just been cleaned by RN with skin protectants applied so did not have pt attempt to use long handled sponge or toilet wide with wash cloth for these areas), pt able to wash upper legs by hand and lower legs and feet with long handled sponge Upper Body Dressing : Minimal assistance,Sitting Lower Body Dressing: Minimal assistance Lower Body Dressing Details (indicate cue type and reason): sitting EOB using wide sock aid to donn socks Toilet Transfer: Moderate assistance,+2 for physical assistance Toilet Transfer Details (indicate cue type and reason): sit<>stand from bed using rocking motion for momentum and RW to then side step up towards [place] and Hygiene: Total assistance,Sit to/from stand General ADL Comments: pt declined OOB to chair due to discomfort in chair, requested bariatric BSC from unit secretary  Cognition: Cognition Overall Cognitive Status: Within Functional Limits for tasks assessed Orientation Level: Oriented X4 Cognition Arousal/Alertness: Awake/alert Behavior During Therapy: WFL for tasks assessed/performed Overall Cognitive Status: Within Functional Limits for tasks assessed General Comments: Needs encouragement.  Physical Exam: Blood pressure 116/68, pulse 91, temperature 98.4 F (36.9 C), temperature source Oral, resp. rate 19, height 5\' 3"  (1.6 m), weight (!) 234.5 kg, SpO2 94 %. Physical Exam Vitals and nursing note reviewed.  Constitutional:      Appearance: Normal  appearance. She is obese. She is not ill-appearing.     Comments: Edentulous  Laying in bariatric low air loss mattress/bed; was able to turn over with some assistance x1 person; NAD BMI of 91  HENT:     Head: Normocephalic and atraumatic.     Right Ear: External ear normal.     Left Ear: External ear normal.     Nose: Nose normal. No  congestion.     Mouth/Throat:     Mouth: Mucous membranes are dry.     Pharynx: Oropharynx is clear. No oropharyngeal exudate.  Eyes:     General:        Right eye: No discharge.        Left eye: No discharge.     Extraocular Movements: Extraocular movements intact.  Cardiovascular:     Rate and Rhythm: Normal rate and regular rhythm.     Heart sounds: Normal heart sounds. No murmur heard. No gallop.   Pulmonary:     Comments: CTA B/L- no W/R/R- good air movement  Abdominal:     General: There is no distension.     Palpations: There is no mass.     Comments: Protuberant; has large pannus; Soft, NT, ND, (+)BS    Musculoskeletal:     Cervical back: Normal range of motion and neck supple.     Right lower leg: Edema present.     Left lower leg: Edema present.     Comments: UEs 4+/5 in biceps, triceps, WE, grip and finger and was 4-/5 B/L LEs:- HF 4-/5 , KE 4/5 on R and 4+/5 on L; DF and PF 5-/5 B/L   Skin:    General: Skin is warm and dry.     Comments: Pt's buttocks didn't have wounds- healed However has multiple weeping wounds underneath pannus on abdomen and 1 very large plaque appearing lesion- no interdry in middle, just on sides A few small wounds between thighs that were open/weeping, however had multiple wounds that had coalesced on the back of her thighs, near crease of buttocks that were open/weeping/pink- healing better on thighs than under pannus- strong odor of yeast.     Neurological:     Mental Status: She is alert and oriented to person, place, and time.     Comments: Intact to light touch in UEs and LLE; decreased with  pins/needles/light touch from mid calf/shin to toes on RLE- otherwise intact  Psychiatric:        Mood and Affect: Mood normal.        Behavior: Behavior normal.     Results for orders placed or performed during the hospital encounter of 08/18/20 (from the past 48 hour(s))  Basic metabolic panel     Status: Abnormal   Collection Time: 08/24/20  4:51 AM  Result Value Ref Range   Sodium 133 (L) 135 - 145 mmol/L   Potassium 3.9 3.5 - 5.1 mmol/L   Chloride 101 98 - 111 mmol/L   CO2 26 22 - 32 mmol/L   Glucose, Bld 90 70 - 99 mg/dL    Comment: Glucose reference range applies only to samples taken after fasting for at least 8 hours.   BUN 10 6 - 20 mg/dL   Creatinine, Ser 1.45 (H) 0.44 - 1.00 mg/dL   Calcium 8.9 8.9 - 10.3 mg/dL   GFR, Estimated 46 (L) >60 mL/min    Comment: (NOTE) Calculated using the CKD-EPI Creatinine Equation (2021)    Anion gap 6 5 - 15    Comment: Performed at [facility] [address] [place], [place] [address]  CBC     Status: Abnormal   Collection Time: 08/24/20  4:51 AM  Result Value Ref Range   WBC 6.1 4.0 - 10.5 K/uL   RBC 3.69 (L) 3.87 - 5.11 MIL/uL   Hemoglobin 8.3 (L) 12.0 - 15.0 g/dL   HCT 29.4 (L) 36.0 - 46.0 %  MCV 79.7 (L) 80.0 - 100.0 fL   MCH 22.5 (L) 26.0 - 34.0 pg   MCHC 28.2 (L) 30.0 - 36.0 g/dL   RDW 24.8 (H) 11.5 - 15.5 %   Platelets 412 (H) 150 - 400 K/uL   nRBC 0.0 0.0 - 0.2 %    Comment: Performed at [facility] [address] [place], [place] [address]   No results found.     Medical Problem List and Plan: 1.  Debility secondary to panniculitis and SIRS  -patient may  shower  -ELOS/Goals: 8-`11 days- supervision 2.  Antithrombotics: -DVT/anticoagulation:  Pharmaceutical: Lovenox  -antiplatelet therapy: N/A 3. Chronic back pain/Pain Management: Used Mmobic and robaxin. Continue tylenol prn  4. Mood: LCSW to follow for evaluation and support.   -antipsychotic agents: N/A 5. Neuropsych: This patient is  capable of making decisions on her own behalf. 6. Skin/Wound Care: Routine pressure relief measures. Foley removed- is voiding  --Continue Silvadene and Interdry per WOC recommendations.   --is Malnourished with albumin 2.1.  Will add collagen and protein to promote wound healing  --Add vitamin C, Zinc and multivitamin as supplements.   7. Fluids/Electrolytes/Nutrition: Monitor I/O. Check lytes in am.  8. Acute on chronic renal failure: Improved from 2.0-->1.45 9. Hyponatremia: Will continue to monitor. Question dilutional  10. Anemia of chronic diease: Monitor for signs of bleeding. H/H has been 7-8 range.  11. COPD/OHS/OSA: Has declined CPAP use. Encourage pulmonary hygeine. On Singulair. 12. Schizoaffective bipolar d/o: Continue Wellbutrin with cymbalta and buspar..  13. RLS: On Requip.  14. Super Morbid obesity:  Educate on appropriate diet, self care and weight loss to help promote mobility and overall health. 15. Chronic Insomnia: Seroquel started a month ago-caused dizziness- not working. Will d/w pt in AM what she's tried and see if can try anything.  16. Renal mass: Was set to see MD at [facility] urology--had to be rescheduled.  17. Panniculitis- with open weeping wounds- will con't WOC recs with interdry and wound care- might need some Diflucan to help with yeast overgrowth.  18. Dizziness/lightheadedness- low BP when standing? Will check orthostatics.      Bary Leriche, PA-C 08/24/2020   I have personally performed a face to face diagnostic evaluation of this patient and formulated the key components of the plan.  Additionally, I have personally reviewed laboratory data, imaging studies, as well as relevant notes and concur with the physician assistant's documentation above.

## 2020-08-25 LAB — COMPREHENSIVE METABOLIC PANEL
ALT: 20 U/L (ref 0–44)
AST: 30 U/L (ref 15–41)
Albumin: 2.5 g/dL — ABNORMAL LOW (ref 3.5–5.0)
Alkaline Phosphatase: 65 U/L (ref 38–126)
Anion gap: 5 (ref 5–15)
BUN: 11 mg/dL (ref 6–20)
CO2: 28 mmol/L (ref 22–32)
Calcium: 9.1 mg/dL (ref 8.9–10.3)
Chloride: 100 mmol/L (ref 98–111)
Creatinine, Ser: 1.47 mg/dL — ABNORMAL HIGH (ref 0.44–1.00)
GFR, Estimated: 45 mL/min — ABNORMAL LOW (ref >60)
Glucose, Bld: 91 mg/dL (ref 70–99)
Potassium: 3.9 mmol/L (ref 3.5–5.1)
Sodium: 133 mmol/L — ABNORMAL LOW (ref 135–145)
Total Bilirubin: 0.2 mg/dL — ABNORMAL LOW (ref 0.3–1.2)
Total Protein: 6.6 g/dL (ref 6.5–8.1)

## 2020-08-25 LAB — CBC WITH DIFFERENTIAL/PLATELET
Abs Immature Granulocytes: 0.02 10*3/uL (ref 0.00–0.07)
Basophils Absolute: 0 10*3/uL (ref 0.0–0.1)
Basophils Relative: 1 %
Eosinophils Absolute: 0.5 10*3/uL (ref 0.0–0.5)
Eosinophils Relative: 10 %
HCT: 31.1 % — ABNORMAL LOW (ref 36.0–46.0)
Hemoglobin: 8.7 g/dL — ABNORMAL LOW (ref 12.0–15.0)
Immature Granulocytes: 0 %
Lymphocytes Relative: 36 %
Lymphs Abs: 1.9 10*3/uL (ref 0.7–4.0)
MCH: 22.3 pg — ABNORMAL LOW (ref 26.0–34.0)
MCHC: 28 g/dL — ABNORMAL LOW (ref 30.0–36.0)
MCV: 79.7 fL — ABNORMAL LOW (ref 80.0–100.0)
Monocytes Absolute: 0.4 10*3/uL (ref 0.1–1.0)
Monocytes Relative: 8 %
Neutro Abs: 2.4 10*3/uL (ref 1.7–7.7)
Neutrophils Relative %: 45 %
Platelets: 429 10*3/uL — ABNORMAL HIGH (ref 150–400)
RBC: 3.9 MIL/uL (ref 3.87–5.11)
RDW: 25.1 % — ABNORMAL HIGH (ref 11.5–15.5)
WBC: 5.4 10*3/uL (ref 4.0–10.5)
nRBC: 0 % (ref 0.0–0.2)

## 2020-08-25 MED ORDER — FLUCONAZOLE 100 MG PO TABS
100.0000 mg | ORAL_TABLET | Freq: Every day | ORAL | Status: AC
  Administered 2020-08-26 – 2020-08-31 (×6): 100 mg via ORAL
  Filled 2020-08-25 (×6): qty 1

## 2020-08-25 MED ORDER — FLUCONAZOLE 200 MG PO TABS
200.0000 mg | ORAL_TABLET | Freq: Once | ORAL | Status: AC
  Administered 2020-08-25: 200 mg via ORAL
  Filled 2020-08-25: qty 1

## 2020-08-25 MED ORDER — TRAZODONE HCL 50 MG PO TABS
25.0000 mg | ORAL_TABLET | Freq: Every day | ORAL | Status: DC
  Administered 2020-08-25 – 2020-09-13 (×20): 25 mg via ORAL
  Filled 2020-08-25 (×21): qty 1

## 2020-08-25 MED ORDER — ENOXAPARIN SODIUM 100 MG/ML IJ SOSY
100.0000 mg | PREFILLED_SYRINGE | INTRAMUSCULAR | Status: DC
  Administered 2020-08-25 – 2020-09-12 (×17): 100 mg via SUBCUTANEOUS
  Filled 2020-08-25 (×23): qty 1

## 2020-08-25 NOTE — Progress Notes (Signed)
[facility] Individual Statement of Services  Patient Name:  Heather [name]  Date:  08/25/2020  Welcome to the [facility].  Our goal is to provide you with an individualized program based on your diagnosis and situation, designed to meet your specific needs.  With this comprehensive rehabilitation program, you will be expected to participate in at least 3 hours of rehabilitation therapies Monday-Friday, with modified therapy programming on the weekends.  Your rehabilitation program will include the following services:  Physical Therapy (PT), Occupational Therapy (OT), Speech Therapy (ST), 24 hour per day rehabilitation nursing, Therapeutic Recreaction (TR), Neuropsychology, Care Coordinator, Rehabilitation Medicine, Nutrition Services, Pharmacy Services and Other  Weekly team conferences will be held on Tuesdays to discuss your progress.  Your Inpatient Rehabilitation Care Coordinator will talk with you frequently to get your input and to update you on team discussions.  Team conferences with you and your family in attendance may also be held.  Expected length of stay: 8-11 Days  Overall anticipated outcome: MOD I  Depending on your progress and recovery, your program may change. Your Inpatient Rehabilitation Care Coordinator will coordinate services and will keep you informed of any changes. Your Inpatient Rehabilitation Care Coordinator's name and contact numbers are listed  below.  The following services may also be recommended but are not provided by the [facility]:    [place] will be made to provide these services after discharge if needed.  Arrangements include referral to agencies that provide these services.  Your insurance has been verified to be:  Medicaid [place] Access Your primary doctor is:  Lucianne Lei, MD  Pertinent information will  be shared with your doctor and your insurance company.  Inpatient Rehabilitation Care Coordinator:  [name], [place] or [phone]  Information discussed with and copy given to patient by: Dyanne Iha, 08/25/2020, 10:28 AM

## 2020-08-25 NOTE — Evaluation (Signed)
Occupational Therapy Assessment and Plan  Patient Details  Name: Heather [name] MRN: [id number] Date of Birth: 1976/02/01  OT Diagnosis: abnormal posture, muscle weakness (generalized) and impaired sensation Rehab Potential: Rehab Potential (ACUTE ONLY): Fair ELOS: 12-14 days   Today's Date: 08/25/2020 OT Individual Time: 5277-8242 and 3536-1443 OT Individual Time Calculation (min): 55 min   And 35 min  Hospital Problem: Principal Problem:   Debility Active Problems:   Panniculitis   Class 3 severe obesity with serious comorbidity and body mass index (BMI) greater than or equal to 70 in adult [facility])   Physical debility   Past Medical History:  Past Medical History:  Diagnosis Date  . Allergy   . Amenorrhea   . Anemia    post partum   . Anxiety   . Anxiety   . Arthritis   . Asthma   . Back pain   . Constipation   . COPD (chronic obstructive pulmonary disease) ([place])   . Delivery with history of C-section   . Depression   . Depression   . Diabetes mellitus without complication [facility])    patient denies but states she has hyperglycemia-diet controlled  . Dysmenorrhea   . Dysrhythmia    DR Johnsie Cancel    . Ectopic pregnancy 2013  . Edema, lower extremity   . Eosinophilic esophagitis    Diagnosed at [facility] 06/16/2013, untreated  . Gallbladder problem   . GERD (gastroesophageal reflux disease)    HEARTBURN   TUMS  . Hard to intubate 11/07/2015  . High cholesterol   . IBS (irritable bowel syndrome)   . Leukocytosis 07/28/2008   Qualifier: Diagnosis of  By: Jonna Munro MD, Roderic Scarce    . Morbid obesity ([place])   . Neuromuscular disorder (HCC)    RESTLESS LEG   . Obesity   . Schizoaffective disorder, bipolar type ([place])   . Sepsis ([place]) 11/11/2014  . Shortness of breath    WITH EXERTION   . Sleep apnea    CPAP- in process of restarting    Past Surgical History:  Past Surgical History:  Procedure Laterality Date  . BIOPSY  08/13/2018   Procedure: BIOPSY;  Surgeon:  Yetta Flock, MD;  Location: [facility];  Service: Gastroenterology;;  . CESAREAN SECTION MULTI-GESTATIONAL N/A 02/03/2017   Procedure: CESAREAN SECTION MULTI-GESTATIONAL;  Surgeon: Jonnie Kind, MD;  Location: [place];  Service: Obstetrics;  Laterality: N/A;  . CHOLECYSTECTOMY    . COLONOSCOPY WITH PROPOFOL N/A 08/13/2018   Procedure: COLONOSCOPY WITH PROPOFOL;  Surgeon: Yetta Flock, MD;  Location: [facility];  Service: Gastroenterology;  Laterality: N/A;  . DENTAL SURGERY    . ESOPHAGOGASTRODUODENOSCOPY  May 2007   Dr. Gala Romney: Normal esophagus, stomach, D1, D2  . ESOPHAGOGASTRODUODENOSCOPY  06/16/2013   Dr. Carlton Adam, eosinophilic esophagitis, reactive gastropathy, no esophageal dilation  . ESOPHAGOGASTRODUODENOSCOPY (EGD) WITH PROPOFOL N/A 08/13/2018   Procedure: ESOPHAGOGASTRODUODENOSCOPY (EGD) WITH PROPOFOL;  Surgeon: Yetta Flock, MD;  Location: [facility];  Service: Gastroenterology;  Laterality: N/A;  . POLYPECTOMY  08/13/2018   Procedure: POLYPECTOMY;  Surgeon: Yetta Flock, MD;  Location: [facility];  Service: Gastroenterology;;  . TONSILLECTOMY    . TOOTH EXTRACTION  10/28/2011   Procedure: DENTAL RESTORATION/EXTRACTIONS;  Surgeon: Gae Bon, DDS;  Location: [place];  Service: Oral Surgery;;  . UPPER GASTROINTESTINAL ENDOSCOPY      Assessment & Plan Clinical Impression: Heather [name] is a 45 year old female with history of COPD, chronic LBP, super morbid obesity -  BMI 91, untreated sleep apnea, insulin resistance, recent admission 05/04- 05/13 post fall (laid on the floor for 2 days) with UTI, AKI and panniculitis. Patient decline SNF, was set up at [facility] but fell as RLE gave out while trying to get in, struck her head and was unable to walk. She was readmitted 08/18/20 for work up. CT abdomen/pelvis/head negative for acute changes but she was noted to be hypotensive, had leucocytosis with WBC 12.4 and was started on IV rocephin  for SIRS v/s dehydration.  WOC was reconsulted for input on MASD to multiple skin folds, peri area as well as erythema intertrigo and recommended silvadene bid to open areas, interdry to skin folds and bathing of patient at lest daily. Blood cultures/urine cultures have been negative and leucocytosis has resolved. Antibiotics d/c 05/15 and she has been afebrile. Acute on chronic renal failure has improved with SCr back to baseline. She has refused CPAP use. Foley was placed toplace to prevent contamination/worsening of wounds and d/c yesterday.Therapy ongoing and CIR recommended due to weakness and debility.   Pt reports gets dizzy with walking; notes BP this Am was 112/73- used to it being 078M-754G sytolic.  Resp therapy notes show pt refuses CPAP nightly.  Likes bariatric air mattress- is more comfortable.  LBM yesterday Took out foley- using the bathroom.  Admits that she has urinary incontinence- chronic "sometimes".  Patient currently requires max-total with basic self-care skills secondary to muscle weakness, decreased cardiorespiratoy endurance and decreased sitting balance and decreased balance strategies.  Prior to hospitalization, patient could complete BADLs with modified independent .  Patient will benefit from skilled intervention to increase independence with basic self-care skills prior to discharge home with care partner.  Anticipate patient will require 24 hour supervision and minimal physical assistance and follow up home health.  OT - End of Session Endurance Deficit: Yes Endurance Deficit Description: Pt requesting to lie back down x2 during ADL session due to fatigue OT Assessment Rehab Potential (ACUTE ONLY): Fair OT Barriers to Discharge: Home environment access/layout;Incontinence;Wound Care;Weight OT Patient demonstrates impairments in the following area(s): Warehouse manager;Endurance;Motor;Nutrition;Safety;Sensory OT Basic ADL's Functional Problem(s):  Grooming;Bathing;Dressing;Toileting OT Advanced ADL's Functional Problem(s): Light Housekeeping OT Transfers Functional Problem(s): Toilet;Tub/Shower OT Additional Impairment(s): None OT Plan OT Intensity: Minimum of 1-2 x/day, 45 to 90 minutes OT Frequency: 5 out of 7 days OT Duration/Estimated Length of Stay: 12-14 days OT Treatment/Interventions: Balance/vestibular training;Disease mangement/prevention;Self Care/advanced ADL retraining;Therapeutic Exercise;Wheelchair propulsion/positioning;UE/LE Strength taining/ROM;Skin care/wound managment;Pain management;DME/adaptive equipment instruction;Community reintegration;Patient/family education;UE/LE Coordination activities;Therapeutic Activities;Psychosocial support;Functional mobility training;Discharge planning OT Self Feeding Anticipated Outcome(s): No goal OT Basic Self-Care Anticipated Outcome(s): Min A OT Toileting Anticipated Outcome(s): Min A OT Bathroom Transfers Anticipated Outcome(s): CGA OT Recommendation Recommendations for Other Services: Neuropsych consult Patient destination: Home ([employer]) Equipment Recommended: To be determined   OT Evaluation Precautions/Restrictions  Precautions Precautions: Fall Precaution Comments: reports R knee buckling in standing, 2 previous recent falls. 3 in the past 6 months, skin breakdown in periareas Restrictions Weight Bearing Restrictions: No General PT Missed Treatment Reason: Patient fatigue;Patient ill (Comment) Pain Pain Assessment Pain Scale: 0-10 Pain Score: 0-No pain Home Living/Prior Functioning Home Living Family/patient expects to be discharged to:: Other (Comment) Living Arrangements: Parent,Children (mother and 20 year old twins) Available Help at Discharge: Family,Available 24 hours/day Type of Home: Other(Comment) ([employer]) Home Access: Level entry Home Layout: One level Bathroom Shower/Tub: Chiropodist: Standard Bathroom Accessibility:  Yes Additional Comments: per pt, her hotel bathroom should be accessible while  using the bariatric RW  Lives With: Family IADL History Homemaking Responsibilities: No (Per pt, her mother took care of IADL responsibilities like cleaning/cooking. 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, art) Comments: pt reports decline in function since C-section with twins ~3 years ago s/p spinal block Vision Baseline Vision/History: No visual deficits Patient Visual Report: No change from baseline Vision Assessment?: No apparent visual deficits Perception  Perception: Within Functional Limits Praxis Praxis: Intact Cognition Overall Cognitive Status: Within Functional Limits for tasks assessed Arousal/Alertness: Awake/alert Orientation Level: Person;Place;Situation Person: Oriented Place: Oriented Situation: Oriented Year: 2022 Month: May Day of Week: Correct Immediate Memory Recall: Sock;Blue;Bed Memory Recall Sock: With Cue Memory Recall Blue: Without Cue Memory Recall Bed: Without Cue Attention: Focused;Sustained Focused Attention: Appears intact Sustained Attention: Appears intact Awareness: Appears intact Problem Solving: Appears intact Safety/Judgment: Appears intact Sensation Sensation Light Touch: Impaired by gross assessment (pt reports numbness/tingling sensations mostly in feet bilaterally, sometimes in hands) Coordination Gross Motor Movements are Fluid and Coordinated: No Fine Motor Movements are Fluid and  Coordinated: No Coordination and Movement Description: Coordination impacted by body habitus, pt reports having eposides where her hands "don't work" with dropping spells, pt dropping 2 items during session Finger Nose Finger Test: WNL bilaterally, decreased speed noted Motor  Motor Motor: Other (comment) Motor - Skilled Clinical Observations: Affected by body habitus, generalized weakness  Trunk/Postural Assessment  Cervical Assessment Cervical Assessment: Within Functional Limits Thoracic Assessment Thoracic Assessment: Exceptions to [facility] (kyphosis) Lumbar Assessment Lumbar Assessment: Exceptions to [facility] (posterior pelvic tilt) Postural Control Postural Control: Deficits on evaluation (limited in sitting due to body habitus) Righting Reactions: decreased overall and limited 2/2 body habitus Protective Responses: decreased Postural Limitations: decreased overall and limited 2/2 body habitus  Balance Balance Balance Assessed: Yes Static Sitting Balance Static Sitting - Balance Support: Feet supported Static Sitting - Level of Assistance: 5: Stand by assistance Dynamic Sitting Balance Dynamic Sitting - Balance Support: Feet supported Dynamic Sitting - Level of Assistance: 4: Min assist (attempting sit<stand with bari walker) Dynamic Sitting - Balance Activities: Lateral lean/weight shifting;Reaching for objects;Reaching across midline Sitting balance - Comments: pt able to tolerate sitting EOB for ~30 mins with functional activities however multiple rest breaks required in static sitting with trunk unsupported. Dynamic Standing Balance Dynamic Standing - Balance Support:  (not assessed) Extremity/Trunk Assessment RUE Assessment RUE Assessment: Within Functional Limits Active Range of Motion (AROM) Comments: WNL, tended to gravitate into scaption when cued to abduct shoulders General Strength Comments: 4-/5 grossly LUE Assessment LUE Assessment: Within Functional Limits Active  Range of Motion (AROM) Comments: WNL, gravitated into scaption when cued to abduction shoulders General Strength Comments: 4/5 grossly  Care Tool Care Tool Self Care    Oral Care    Oral Care Assist Level: Set up assist    Bathing   Body parts bathed by patient: Right arm;Left arm;Chest;Face Body parts bathed by helper: Abdomen;Front perineal area;Buttocks;Right upper leg;Left upper leg;Right lower leg;Left lower leg   Assist Level: 2 Helpers    Upper Body Dressing(including orthotics)   What is the patient wearing?: Dress   Assist  Level: Moderate Assistance - Patient 50 - 74%    Lower Body Dressing (excluding footwear)   What is the patient wearing?: Incontinence brief Assist for lower body dressing: 2 Helpers    Putting on/Taking off footwear   What is the patient wearing?: Non-skid slipper socks Assist for footwear: Dependent - Patient 0%        Care Tool Bed Mobility Roll left and right activity   Roll left and right assist level: Minimal Assistance - Patient > 75%    Sit to lying activity   Sit to lying assist level: Minimal Assistance - Patient > 75%    Lying to sitting edge of bed activity   Lying to sitting edge of bed assist level: Moderate Assistance - Patient 50 - 74%     Care Tool Transfers Sit to stand transfer   Sit to stand assist level: Maximal Assistance - Patient 25 - 49%    Chair/bed transfer Chair/bed transfer activity did not occur: Safety/medical concerns       Toilet transfer Toilet transfer activity did not occur: Safety/medical concerns (not safe to attempt due to pts inability to stand at time of eval)       Care Tool Cognition Expression of Ideas and Wants Expression of Ideas and Wants: Without difficulty (complex and basic) - expresses complex messages without difficulty and with speech that is clear and easy to understand   Understanding Verbal and Non-Verbal Content Understanding Verbal and Non-Verbal Content: Understands (complex  and basic) - clear comprehension without cues or repetitions   Memory/Recall Ability *first 3 days only Memory/Recall Ability *first 3 days only: Current season;Location of own room;Staff names and faces;That he or she is in a hospital/hospital unit    Refer to Care Plan for [place] 1 OT Short Term Goal 1 (Week 1): Pt will complete toilet transfer with no more than 2 assist and LRAD OT Short Term Goal 2 (Week 1): Pt will complete LB dressing at sit<stand level during 1 ADL session with 1 assist OT Short Term Goal 3 (Week 1): Pt will complete 1 grooming task while sitting at the sink to increase OOB tolerance  Recommendations for other services: Neuropsych   Skilled Therapeutic Intervention Skilled OT session completed with focus on initial evaluation, education on OT role/POC, and establishment of patient-centered goals.   Pt greeted in bed with no c/o pain, agreeable to engage in bathing/dressing tasks. Pt used the bedrail with HOB elevated to reach EOB unassisted. Mod A to thoroughly wash/dry UB including under skin folds, medicinal creams/interdry already applied by nursing staff, per pt, around 6AM this morning. After donning her overhead dress, attempted sit<stand using bariatric walker but pt unable to stand due to Rt LE>Lt LE weakness. Had to return to bed after attempting twice as she felt like she was sliding forward (bed set at max firmness) and feeling like she was "going to fall." Pericare completed +2 assist bedlevel with OT and RN due to wounds/skin integrity around periareas + upper thighs. Max A of 1 for rolling/maintaining sidelying in each direction while RN assisted with bathing and application of medicinal creams. Pt left in care of nursing at end of session.    2nd Session 1:1 tx (35 min) Pt greeted in bed with no c/o pain. She was very motivated to have her hair washed. Supine<sit completed unassisted with pt using the bedrail. OT washed her  hair using the hair washing tray while she sat  EOB, using a large bowl as a makeshift "sink." Worked on UB strengthening/endurance while she combed and blow-dried hair given Mod A. At end of session pt returned to bed and was left with all needs within reach. Tx focus placed on ADL retraining and sitting balance.  ADL ADL Grooming: Setup Where Assessed-Grooming: Edge of bed Upper Body Bathing: Moderate assistance Where Assessed-Upper Body Bathing: Edge of bed Lower Body Bathing: Other (comment) (+2 assist) Where Assessed-Lower Body Bathing: Edge of bed;Bed level Upper Body Dressing: Moderate assistance Where Assessed-Upper Body Dressing: Edge of bed;Bed level Lower Body Dressing: Dependent Where Assessed-Lower Body Dressing: Bed level Toileting: Not assessed Toilet Transfer: Not assessed Tub/Shower Transfer: Not assessed Mobility  Bed Mobility Bed Mobility: Rolling Left;Supine to Sit;Sit to Supine;Scooting to [facility] Rolling Left: Minimal Assistance - Patient > 75% Supine to Sit: Moderate Assistance - Patient 50-74% Sit to Supine: Minimal Assistance - Patient > 75% Scooting to HOB: Minimal Assistance - Patient > 75% Transfers Sit to Stand: Maximal Assistance - Patient 25-49% Stand to Sit: Maximal Assistance - Patient 25-49%   Discharge Criteria: Patient will be discharged from OT if patient refuses treatment 3 consecutive times without medical reason, if treatment goals not met, if there is a change in medical status, if patient makes no progress towards goals or if patient is discharged from hospital.  The above assessment, treatment plan, treatment alternatives and goals were discussed and mutually agreed upon: by patient  Skeet Simmer 08/25/2020, 12:34 PM

## 2020-08-25 NOTE — IPOC Note (Signed)
Overall Plan of Care [facility]) Patient Details Name: Heather [name] MRN: [id number] DOB: July 19, 1975  Admitting Diagnosis: [facility] Problems: Principal Problem:   Debility Active Problems:   Panniculitis   Class 3 severe obesity with serious comorbidity and body mass index (BMI) greater than or equal to 70 in adult [facility])   Physical debility     Functional Problem List: Nursing Behavior,Bladder,Bowel,Edema,Endurance,Medication Management,Pain,Perception,Safety,Skin Integrity  PT Balance,Nutrition,Skin Integrity,Pain,Behavior,Perception,Edema,Endurance,Safety,Sensory  OT Balance,Skin Integrity,Endurance,Motor,Nutrition,Safety,Sensory  SLP    TR         Basic ADL's: OT Grooming,Bathing,Dressing,Toileting     Advanced  ADL's: OT Light Housekeeping     Transfers: PT Bed Mobility,Bed to Chair  OT Toilet,Tub/Shower     Locomotion: PT Ambulation,Wheelchair Mobility,Stairs     Additional Impairments: OT None  SLP        TR      Anticipated Outcomes Item Anticipated Outcome  Self Feeding No goal  Swallowing      Basic self-care  Min A  Toileting  Min A   Bathroom Transfers CGA  Bowel/Bladder  Mod I  Transfers  CGA  Locomotion  CGA household distances with RW; supervision in [facility]  Communication     Cognition     Pain  <3  Safety/Judgment  Mod I and no falls   Therapy Plan: PT Intensity: Minimum of 1-2 x/day ,45 to 90 minutes PT Frequency: 5 out of 7 days PT Duration Estimated Length of Stay: 8-14 days OT Intensity: Minimum of 1-2 x/day, 45 to 90 minutes OT Frequency: 5 out of 7 days OT Duration/Estimated Length of Stay: 12-14 days     Due to the current state of emergency, patients may not be receiving their 3-hours of Medicare-mandated therapy.   Team Interventions: Nursing Interventions Patient/Family Education,Bladder Management,Bowel Management,Disease Management/Prevention,Pain Management,Medication Management,Skin Care/Wound  Management,Discharge Planning,Psychosocial Support  PT interventions Ambulation/gait training,Balance/vestibular training,Community reintegration,Cognitive remediation/compensation,Discharge planning,Disease Printmaker instruction,Functional mobility training,Neuromuscular re-education,Patient/family education,Pain management,Psychosocial support,Skin care/wound management,Stair training,Splinting/orthotics,Visual/perceptual remediation/compensation,Therapeutic Activities,UE/LE Strength taining/ROM,Therapeutic Exercise,UE/LE Museum/gallery conservator propulsion/positioning  OT Interventions Balance/vestibular training,Disease mangement/prevention,Self Care/advanced ADL retraining,Therapeutic Exercise,Wheelchair propulsion/positioning,UE/LE Strength taining/ROM,Skin care/wound Cytogeneticist instruction,Community reintegration,Patient/family education,UE/LE Coordination activities,Therapeutic Activities,Psychosocial support,Functional mobility training,Discharge planning  SLP Interventions    TR Interventions    SW/CM Interventions Discharge Planning,Psychosocial Support,Disease Management/Prevention,Patient/Family Education   Barriers to Discharge MD  Medical stability, Home enviroment access/loayout, Incontinence, Wound care, Lack of/limited family support, Weight, Weight bearing restrictions and Medication compliance  Nursing Decreased caregiver support,Home environment access/layout,Incontinence,Wound Care,Lack of/limited family support,Medication compliance,Behavior,Nutrition means    PT Inaccessible home environment,Decreased caregiver support,Home environment access/layout,Lack of/limited family support,Weight,Medication compliance,Nutrition means,Behavior,Incontinence pt reports she does not know how new apartment will be setup and unclear if she will need stairs or assistance  OT Home environment  access/layout,Incontinence,Wound Care,Weight    SLP      SW Home environment access/layout,Insurance for SNF coverage,Other (comments),Lack of/limited family support,Weight,Wound Care Large DME unable to fit hotel   Team Discharge Planning: Destination: PT-Home ,OT- Home ([employer]) , SLP-  Projected Follow-up: PT-Home health PT, OT-   , SLP-  Projected Equipment Needs: PT-To be determined, OT- To be determined, SLP-  Equipment Details: PT- , OT-  Patient/family involved in discharge planning: PT- Patient,  OT-Patient, SLP-   MD ELOS: 12-14 days Medical Rehab Prognosis:  Fair Assessment: Pt is a 45 yr old female with BMI >80 and a myriad of medical issues who's here for debility after panniculitis, and a UTI and fall as motel-  Goals Min A to CGA  by d/c.     See Team Conference Notes for weekly updates to the plan of care

## 2020-08-25 NOTE — Evaluation (Addendum)
Physical Therapy Assessment and Plan  Patient Details  Name: Heather [name] MRN: [id number] Date of Birth: [birth year]  PT Diagnosis: Abnormal posture, Abnormality of gait, Difficulty walking, Impaired sensation and Muscle weakness Rehab Potential:   fair ELOS:   8-14 days  Today's Date: 08/25/2020 PT Individual Time: 1445-1530 and 1000-1050 PT Individual Time Calculation (min): 45 min and 50 mins    Hospital Problem: Principal Problem:   Debility Active Problems:   Panniculitis   Class 3 severe obesity with serious comorbidity and body mass index (BMI) greater than or equal to 70 in adult [facility])   Physical debility   Past Medical History:  Past Medical History:  Diagnosis Date  . Allergy   . Amenorrhea   . Anemia    post partum   . Anxiety   . Anxiety   . Arthritis   . Asthma   . Back pain   . Constipation   . COPD (chronic obstructive pulmonary disease) ([place])   . Delivery with history of C-section   . Depression   . Depression   . Diabetes mellitus without complication [facility])    patient denies but states she has hyperglycemia-diet controlled  . Dysmenorrhea   . Dysrhythmia    DR Johnsie Cancel    . Ectopic pregnancy 2013  . Edema, lower extremity   . Eosinophilic esophagitis    Diagnosed at [facility] 06/16/2013, untreated  . Gallbladder problem   . GERD (gastroesophageal reflux disease)    HEARTBURN   TUMS  . Hard to intubate 11/07/2015  . High cholesterol   . IBS (irritable bowel syndrome)   . Leukocytosis 07/28/2008   Qualifier: Diagnosis of  By: Jonna Munro MD, Roderic Scarce    . Morbid obesity ([place])   . Neuromuscular disorder (HCC)    RESTLESS LEG   . Obesity   . Schizoaffective disorder, bipolar type ([place])   . Sepsis ([place]) 11/11/2014  . Shortness of breath    WITH EXERTION   . Sleep apnea    CPAP- in process of restarting    Past Surgical History:  Past Surgical History:  Procedure Laterality Date  . BIOPSY  08/13/2018   Procedure: BIOPSY;  Surgeon: Yetta Flock, MD;  Location: [facility];  Service: Gastroenterology;;  . CESAREAN SECTION MULTI-GESTATIONAL N/A 02/03/2017   Procedure: CESAREAN SECTION MULTI-GESTATIONAL;  Surgeon: Jonnie Kind, MD;  Location: [facility];  Service: Obstetrics;  Laterality: N/A;  . CHOLECYSTECTOMY    . COLONOSCOPY WITH PROPOFOL N/A 08/13/2018   Procedure: COLONOSCOPY WITH PROPOFOL;  Surgeon: Yetta Flock, MD;  Location: [facility];  Service: Gastroenterology;  Laterality: N/A;  . DENTAL SURGERY    . ESOPHAGOGASTRODUODENOSCOPY  May 2007   Dr. Gala Romney: Normal esophagus, stomach, D1, D2  . ESOPHAGOGASTRODUODENOSCOPY  06/16/2013   Dr. Carlton Adam, eosinophilic esophagitis, reactive gastropathy, no esophageal dilation  . ESOPHAGOGASTRODUODENOSCOPY (EGD) WITH PROPOFOL N/A 08/13/2018   Procedure: ESOPHAGOGASTRODUODENOSCOPY (EGD) WITH PROPOFOL;  Surgeon: Yetta Flock, MD;  Location: [facility];  Service: Gastroenterology;  Laterality: N/A;  . POLYPECTOMY  08/13/2018   Procedure: POLYPECTOMY;  Surgeon: Yetta Flock, MD;  Location: [facility];  Service: Gastroenterology;;  . TONSILLECTOMY    . TOOTH EXTRACTION  10/28/2011   Procedure: DENTAL RESTORATION/EXTRACTIONS;  Surgeon: Gae Bon, DDS;  Location: [facility] OR;  Service: Oral Surgery;;  . UPPER GASTROINTESTINAL ENDOSCOPY      Assessment & Plan Clinical Impression: Patient is a 45 y.o. year old female with history of COPD, chronic LBP, super  morbid obesity -BMI 91, untreated sleep apnea, insulin resistance, recent admission 05/04- 05/13 post fall (laid on the floor for 2 days) with UTI, AKI and panniculitis. Patient decline SNF, was set up at [facility] but fell as RLE gave out while trying to get in, struck her head and was unable to walk. She was readmitted 08/18/20 for work up. CT abdomen/pelvis/head negative for acute changes but she was noted to be hypotensive, had leucocytosis with WBC 12.4 and was started on IV rocephin for SIRS v/s  dehydration.  WOC was reconsulted for input on MASD to multiple skin folds, peri area as well as erythema intertrigo and recommended silvadene bid to open areas, interdry to skin folds and bathing of patient at lest daily. Blood cultures/urine cultures have been negative and leucocytosis has resolved. Antibiotics d/c 05/15 and she has been afebrile. Acute on chronic renal failure has improved with SCr back to baseline. She has refused CPAP use. Foley was placed toplace to prevent contamination/worsening of wounds and d/c yesterday.Therapy ongoing and CIR recommended due to weakness and debility.  Patient transferred to CIR on 08/24/2020 .   Patient currently requires max with mobility secondary to muscle weakness, decreased cardiorespiratoy endurance, and decreased sitting balance, decreased standing balance, decreased postural control and decreased balance strategies.  Prior to hospitalization, patient was modified independent  with mobility and lived with Family in a Other(Comment) ([employer]) home.  Home access is  Level entry.  Patient will benefit from skilled PT intervention to maximize safe functional mobility, minimize fall risk and decrease caregiver burden for planned discharge home with 24 hour assist.  Anticipate patient will benefit from follow up [facility] at discharge.  PT - End of Session Activity Tolerance: Tolerates 30+ min activity with multiple rests Endurance Deficit: Yes PT Assessment Rehab Potential (ACUTE/IP ONLY): Fair PT Barriers to Discharge: [place] home environment;Decreased caregiver support;Home environment access/layout;Lack of/limited family support;Weight;Medication compliance;Nutrition means;Behavior;Incontinence PT Plan PT Intensity: Minimum of 1-2 x/day ,45 to 90 minutes PT Frequency: 5 out of 7 days PT Duration Estimated Length of Stay: 8-14 days PT Treatment/Interventions: Ambulation/gait training;Balance/vestibular training;Community  reintegration;Cognitive remediation/compensation;Discharge planning;Disease management/prevention;DME/adaptive equipment instruction;Functional mobility training;Neuromuscular re-education;Patient/family education;Pain management;Psychosocial support;Skin care/wound management;Stair training;Splinting/orthotics;Visual/perceptual remediation/compensation;Therapeutic Activities;UE/LE Strength taining/ROM;Therapeutic Exercise;UE/LE Coordination activities;Wheelchair propulsion/positioning PT Recommendation Recommendations for Other Services: Neuropsych consult;Other (comment) (chaplain) Follow Up Recommendations: Home health PT Patient destination: Home Equipment Recommended: To be determined   PT Evaluation Precautions/Restrictions Precautions Precautions: Fall Precaution Comments: reports R knee buckling in standing, 2 previous recent falls. 3 in the past 6 months, skin breakdown in [facility]   Vital SignsTherapy Vitals Temp: 98.4 F (36.9 C) Pulse Rate: 98 Resp: 18 BP: 124/70 Patient Position (if appropriate): Lying Oxygen Therapy SpO2: 95 % O2 Device: Room Air Pain   Home Living/Prior Functioning Home Living Available Help at Discharge: Family;Available 24 hours/day Type of Home: Other(Comment) ([employer]) Home Access: Level entry Home Layout: One level Bathroom Shower/Tub: Research officer, trade union Accessibility: Yes Additional Comments: per pt, her hotel bathroom should be accessible while using the bariatric RW  Lives With: Family Prior Function Level of Independence: Independent with basic ADLs;Independent with gait;Independent with transfers;Requires assistive device for independence Driving: No (per pt, mother drove PTA) Comments: pt reports decline in function since C-section with twins ~3 years ago s/p spinal block Vision/Perception  Perception Perception: Within Functional Limits Praxis Praxis: Intact  Cognition Overall Cognitive Status: Within Functional  Limits for tasks assessed Arousal/Alertness: Awake/alert Immediate Memory Recall: Sock;Blue;Bed Memory Recall Sock: With Cue Memory  Recall Blue: Without Cue Memory Recall Bed: Without Cue Awareness: Appears intact Safety/Judgment: Appears intact Sensation Sensation Light Touch: Impaired by gross assessment (pt reports numbness/tingling sensations mostly in feet bilaterally, sometimes in hands) Coordination Gross Motor Movements are Fluid and Coordinated: No Fine Motor Movements are Fluid and Coordinated: No Coordination and Movement Description: Coordination impacted by body habitus, pt reports having eposides where her hands "don't work" with dropping spells, pt dropping 2 items during session Finger Nose Finger Test: WNL bilaterally, decreased speed noted Motor  Motor Motor: Other (comment) Motor - Skilled Clinical Observations: Affected by body habitus, generalized weakness   Trunk/Postural Assessment  Cervical Assessment Cervical Assessment: Within Functional Limits Thoracic Assessment Thoracic Assessment: Exceptions to [facility] (kyphosis) Lumbar Assessment Lumbar Assessment: Exceptions to [facility] (posterior pelvic tilt) Postural Control Postural Control: Deficits on evaluation (limited in sitting due to body habitus)  Balance Balance Balance Assessed: Yes Dynamic Sitting Balance Dynamic Sitting - Balance Support: Feet supported Dynamic Sitting - Level of Assistance: 4: Min assist (attempting sit<stand with bari walker) Dynamic Standing Balance Dynamic Standing - Balance Support:  (not assessed) Extremity Assessment  RUE Assessment RUE Assessment: Within Functional Limits Active Range of Motion (AROM) Comments: WNL, tended to gravitate into scaption when cued to abduct shoulders General Strength Comments: 4-/5 grossly LUE Assessment LUE Assessment: Within Functional Limits Active Range of Motion (AROM) Comments: WNL, gravitated into scaption when cued to abduction  shoulders General Strength Comments: 4/5 grossly      Care Tool Care Tool Bed Mobility Roll left and right activity   Roll left and right assist level: Minimal Assistance - Patient > 75%    Sit to lying activity   Sit to lying assist level: Minimal Assistance - Patient > 75%    Lying to sitting edge of bed activity   Lying to sitting edge of bed assist level: Moderate Assistance - Patient 50 - 74%     Care Tool Transfers Sit to stand transfer   Sit to stand assist level: Maximal Assistance - Patient 25 - 49%    Chair/bed transfer Chair/bed transfer activity did not occur: Safety/medical concerns       Toilet transfer Toilet transfer activity did not occur: Safety/medical concerns      Scientist, product/process development transfer activity did not occur: Safety/medical concerns        Care Tool Locomotion Ambulation Ambulation activity did not occur: Safety/medical concerns        Walk 10 feet activity Walk 10 feet activity did not occur: Safety/medical concerns       Walk 50 feet with 2 turns activity Walk 50 feet with 2 turns activity did not occur: Safety/medical concerns      Walk 150 feet activity Walk 150 feet activity did not occur: Safety/medical concerns      Walk 10 feet on uneven surfaces activity Walk 10 feet on uneven surfaces activity did not occur: Safety/medical concerns      Stairs Stair activity did not occur: Safety/medical concerns        Walk up/down 1 step activity Walk up/down 1 step or curb (drop down) activity did not occur: Safety/medical concerns     Walk up/down 4 steps activity did not occuR: Safety/medical concerns  Walk up/down 4 steps activity      Walk up/down 12 steps activity Walk up/down 12 steps activity did not occur: Safety/medical concerns      Pick up small objects from floor Pick up small object from the floor (from standing position)  activity did not occur: Safety/medical Arts administrator activity did not  occur: Safety/medical concerns      Wheel 50 feet with 2 turns activity Wheelchair 50 feet with 2 turns activity did not occur: Safety/medical concerns    Wheel 150 feet activity Wheelchair 150 feet activity did not occur: Safety/medical concerns      Refer to Care Plan for [place] 1    Recommendations for other services: Neuropsych and Other: chaplain  Skilled Therapeutic Intervention  Evaluation completed (see details above and below) with education on PT POC and goals and individual treatment initiated with focus on  Bed mobility, transfer training, safety awareness, call light use. pt received in bed and agreeable to therapy. Pt denied pain at start and end of session. Pt directed in rolling R and L x3 min A with use of bedrails, supine>sit mod A for trunk support into sitting EOB, pt unable to touch floor despite air mattress being in lowest position. Pt directed to return to supine, min A. Pt left in good condition in bed, PT left room to retrieve step stool to assist in sitting balance and ability to achieve sitting EOB. Pt then directed in supine>sit EOB min A with use of step stool to support BLE. Pt improved to SBA  Sitting balance. Pt able to bring self closer to edge of bed and agreeable to attempt standing, step stool removed, pt able to touch floor. Pt required x3 attempts and unable to achieve standing with max A. Pt requested to return to bed and rest as she reports she had vomited this AM and was beginning to feel nauseous again. Min A for sit>supine, with Bil rails in place, pt able to reposition self at [facility]. Pt directed in x10 ankle pumps, heel slides, hip abduction/adduction, glute squeezes, quad sets. Pt educated on therapy scheduling, need of assistance for all mobility at this time, potential LOS. Pt agreeable. Pt left in bed, requested to end session 2/2 fatigue and nausea. Pt missed 10 mins of PT session.    Session 2: pt received in bed and  agreeable to therapy. Pt directed in supine>sit min A with use of rails, pt directed in seated exercises of 2x10 overhead press, chest press, lateral raises, bicep curls. Pt requested to lay down to rest and supine>sit min A. Pt directed in BLE exercises 2x10 heel slides, ankle pumps, hip abduction/adduction, glute squeezes. Pt left in bed, All needs in reach and in good condition. Call light in hand.  Pt required rest breaks throughout session 2/2 fatigue and decreased tolerance to activity.      Mobility   Locomotion      Discharge Criteria: Patient will be discharged from PT if patient refuses treatment 3 consecutive times without medical reason, if treatment goals not met, if there is a change in medical status, if patient makes no progress towards goals or if patient is discharged from hospital.  The above assessment, treatment plan, treatment alternatives and goals were discussed and mutually agreed upon: by patient  [name] 08/25/2020, 4:04 PM

## 2020-08-25 NOTE — Plan of Care (Signed)
  Problem: RH Balance Goal: LTG Patient will maintain dynamic standing with ADLs (OT) Description: LTG:  Patient will maintain dynamic standing balance with assist during activities of daily living (OT)  Flowsheets (Taken 08/25/2020 1237) LTG: Pt will maintain dynamic standing balance during ADLs with: Contact Guard/Touching assist   Problem: Sit to Stand Goal: LTG:  Patient will perform sit to stand in prep for activites of daily living with assistance level (OT) Description: LTG:  Patient will perform sit to stand in prep for activites of daily living with assistance level (OT) Flowsheets (Taken 08/25/2020 1237) LTG: PT will perform sit to stand in prep for activites of daily living with assistance level: Contact Guard/Touching assist   Problem: RH Bathing Goal: LTG Patient will bathe all body parts with assist levels (OT) Description: LTG: Patient will bathe all body parts with assist levels (OT) Flowsheets (Taken 08/25/2020 1237) LTG: Pt will perform bathing with assistance level/cueing: Minimal Assistance - Patient > 75%   Problem: RH Dressing Goal: LTG Patient will perform upper body dressing (OT) Description: LTG Patient will perform upper body dressing with assist, with/without cues (OT). Flowsheets (Taken 08/25/2020 1237) LTG: Pt will perform upper body dressing with assistance level of: Contact Guard/Touching assist Goal: LTG Patient will perform lower body dressing w/assist (OT) Description: LTG: Patient will perform lower body dressing with assist, with/without cues in positioning using equipment (OT) Flowsheets (Taken 08/25/2020 1237) LTG: Pt will perform lower body dressing with assistance level of: Minimal Assistance - Patient > 75%   Problem: RH Toileting Goal: LTG Patient will perform toileting task (3/3 steps) with assistance level (OT) Description: LTG: Patient will perform toileting task (3/3 steps) with assistance level (OT)  Flowsheets (Taken 08/25/2020 1237) LTG: Pt  will perform toileting task (3/3 steps) with assistance level: Minimal Assistance - Patient > 75%   Problem: RH Toilet Transfers Goal: LTG Patient will perform toilet transfers w/assist (OT) Description: LTG: Patient will perform toilet transfers with assist, with/without cues using equipment (OT) Flowsheets (Taken 08/25/2020 1237) LTG: Pt will perform toilet transfers with assistance level of: Contact Guard/Touching assist   Problem: RH Tub/Shower Transfers Goal: LTG Patient will perform tub/shower transfers w/assist (OT) Description: LTG: Patient will perform tub/shower transfers with assist, with/without cues using equipment (OT) Flowsheets (Taken 08/25/2020 1237) LTG: Pt will perform tub/shower stall transfers with assistance level of: Contact Guard/Touching assist

## 2020-08-25 NOTE — Plan of Care (Signed)
  Problem: RH BOWEL ELIMINATION Goal: RH STG MANAGE BOWEL WITH ASSISTANCE Description: STG Manage Bowel with Mod I Assistance. Outcome: Not Progressing Note: Patient does not want to get out of bed to bed side commode. Tried to get her up to [facility]. Patient used bedpan instead.   Problem: RH BLADDER ELIMINATION Goal: RH STG MANAGE BLADDER WITH ASSISTANCE Description: STG Manage Bladder With Mod I Assistance Outcome: Not Progressing Note: Patient educated to timed toileting.

## 2020-08-25 NOTE — Progress Notes (Signed)
Inpatient Rehabilitation Care Coordinator Assessment and Plan Patient Details  Name: Heather [name] MRN: [id number] Date of Birth: Apr 05, 1976  Today's Date: 08/25/2020  Hospital Problems: Principal Problem:   Debility Active Problems:   Panniculitis   Class 3 severe obesity with serious comorbidity and body mass index (BMI) greater than or equal to 26 in adult [facility])   Physical debility  Past Medical History:  Past Medical History:  Diagnosis Date  . Allergy   . Amenorrhea   . Anemia    post partum   . Anxiety   . Anxiety   . Arthritis   . Asthma   . Back pain   . Constipation   . COPD (chronic obstructive pulmonary disease) ([place])   . Delivery with history of C-section   . Depression   . Depression   . Diabetes mellitus without complication [facility])    patient denies but states she has hyperglycemia-diet controlled  . Dysmenorrhea   . Dysrhythmia    DR Johnsie Cancel    . Ectopic pregnancy 2013  . Edema, lower extremity   . Eosinophilic esophagitis    Diagnosed at [facility] 06/16/2013, untreated  . Gallbladder problem   . GERD (gastroesophageal reflux disease)    HEARTBURN   TUMS  . Hard to intubate 11/07/2015  . High cholesterol   . IBS (irritable bowel syndrome)   . Leukocytosis 07/28/2008   Qualifier: Diagnosis of  By: Jonna Munro MD, Roderic Scarce    . Morbid obesity ([place])   . Neuromuscular disorder (H[facility])    RESTLESS LEG   . Obesity   . Schizoaffective disorder, bipolar type ([place])   . Sepsis ([place]) 11/11/2014  . Shortness of breath    WITH EXERTION   . Sleep apnea    CPAP- in process of restarting    Past Surgical History:  Past Surgical History:  Procedure Laterality Date  . BIOPSY  08/13/2018   Procedure: BIOPSY;  Surgeon: Yetta Flock, MD;  Location: [facility];  Service: Gastroenterology;;  . CESAREAN SECTION MULTI-GESTATIONAL N/A 02/03/2017   Procedure: CESAREAN SECTION MULTI-GESTATIONAL;  Surgeon: Jonnie Kind, MD;  Location: [place];   Service: Obstetrics;  Laterality: N/A;  . CHOLECYSTECTOMY    . COLONOSCOPY WITH PROPOFOL N/A 08/13/2018   Procedure: COLONOSCOPY WITH PROPOFOL;  Surgeon: Yetta Flock, MD;  Location: [facility];  Service: Gastroenterology;  Laterality: N/A;  . DENTAL SURGERY    . ESOPHAGOGASTRODUODENOSCOPY  May 2007   Dr. Gala Romney: Normal esophagus, stomach, D1, D2  . ESOPHAGOGASTRODUODENOSCOPY  06/16/2013   Dr. Carlton Adam, eosinophilic esophagitis, reactive gastropathy, no esophageal dilation  . ESOPHAGOGASTRODUODENOSCOPY (EGD) WITH PROPOFOL N/A 08/13/2018   Procedure: ESOPHAGOGASTRODUODENOSCOPY (EGD) WITH PROPOFOL;  Surgeon: Yetta Flock, MD;  Location: [facility];  Service: Gastroenterology;  Laterality: N/A;  . POLYPECTOMY  08/13/2018   Procedure: POLYPECTOMY;  Surgeon: Yetta Flock, MD;  Location: [facility];  Service: Gastroenterology;;  . TONSILLECTOMY    . TOOTH EXTRACTION  10/28/2011   Procedure: DENTAL RESTORATION/EXTRACTIONS;  Surgeon: Gae Bon, DDS;  Location[facility];  Service: Oral Surgery;;  . UPPER GASTROINTESTINAL ENDOSCOPY     Social History:  reports that she quit smoking about 9 years ago. Her smoking use included cigarettes. She has a 4.00 pack-year smoking history. She has never used smokeless tobacco. She reports that she does not drink alcohol and does not use drugs.  Family / Support Systems Marital Status: Single Patient Roles: Parent Children: 68 year old twins Other Supports: Marine scientist (Mother),  Fannie Knee (Friend) Anticipated Caregiver: Marjorie Smolder Ability/Limitations of Caregiver: supervision only Caregiver Availability: 24/7 Family Dynamics: Pt does not have much support besides mother  Social History Preferred language: English Religion: Baptist Read: Yes Write: Yes Employment Status: Employed Name of Employer: [employer] Legal History/Current Legal Issues: n/a Guardian/Conservator: n/a   Abuse/Neglect Abuse/Neglect Assessment Can Be Completed: Yes Physical  Abuse: Denies Verbal Abuse: Denies Sexual Abuse: Denies Exploitation of patient/patient's resources: Denies Self-Neglect: Denies  Emotional Status Pt's affect, behavior and adjustment status: yes. Recent Psychosocial Issues: hx of anxiety, depression, post partum, schizoaffective disorder Psychiatric History: schizoaffective disorder, bi polar type Substance Abuse History: n/a  Patient / Family Perceptions, Expectations & Goals Pt/Family understanding of illness & functional limitations: yes Premorbid pt/family roles/activities: Pt previosuly furniture walking, [place], no driving, has had decline physically over 3 years. Anticipated changes in roles/activities/participation: pt anticipated to meet MOD I goals. With some assistance from mother Pt/family expectations/goals: pt anticipated MOD I ( to reach prior LOC, meeting short ambulation goals)  [employer]: None Premorbid Home Care/DME Agencies: Other (Comment) ([employer], Conservation officer, nature, Civil engineer, contracting) Transportation available at discharge: Family able to [employer] referrals recommended: Neuropsychology (hx of anxiety, depression, post partum, schizoaffective disorder)  Discharge Planning Living Arrangements: Parent,Children (mother and 36 year old twins) Support Systems: Children,Parent Type of Residence: Private residence (pt lives in [place] with mother) Insurance Resources: Medicaid (specify county) Pensions consultant: Employment Museum/gallery curator Screen Referred: No Living Expenses: Other (Comment) (Lives at [facility] [place] with family) Money Management: Patient Does the patient have any problems obtaining your medications?: No Home Management: Pt required a little assistance with med mang Patient/Family Preliminary Plans: Mother will cont to assist Care Coordinator Barriers to Discharge: Home environment access/layout,Insurance for SNF coverage,Other (comments),Lack of/limited family support,Weight,Wound  Care Care Coordinator Barriers to Discharge Comments: Large DME unable to fit hotel Care Coordinator Anticipated Follow Up Needs: [place] Additional Notes/Comments: pt plans to discharge back to motel until able to locate permanent housing Expected length of stay: 8-11 Days  Clinical Impression SW met with pt, introduced self and explained role. SW explained to pt goals and ELOS. Pt states PT mentioned pt potentially will need extension, sw will follow up. Pt plans to d/c back to motel with mother to provide supervision LOC. Pt anticipated to meet prior LOC goals. No other questions or concerns. SW will cont to follow up.   Dyanne Iha 08/25/2020, 12:21 PM

## 2020-08-25 NOTE — Progress Notes (Signed)
Patient refused CPAP. Patient states CPAP is uncomfortable. Patient will call if she wants cpap.

## 2020-08-25 NOTE — Progress Notes (Signed)
PROGRESS NOTE   Subjective/Complaints:  Pt reports LBM 2 days ago- denies constipation Passing a lot of gas.  seroquel makes her dizzy, sick, nauseated and lightheaded- and doesn't work- can she stop it?  Trazodone- said always took at least 1 pill- too sedating. Will try 1/2 pill and earlier in evening.  Weight has dropped 234 to 207kg? Not sure why?  ROS:  Pt denies SOB, abd pain, CP, N/V/C/D, and vision changes   Objective:   No results found. Recent Labs    08/24/20 0451 08/25/20 0611  WBC 6.1 5.4  HGB 8.3* 8.7*  HCT 29.4* 31.1*  PLT 412* 429*   Recent Labs    08/24/20 0451 08/25/20 0611  NA 133* 133*  K 3.9 3.9  CL 101 100  CO2 26 28  GLUCOSE 90 91  BUN 10 11  CREATININE 1.45* 1.47*  CALCIUM 8.9 9.1    Intake/Output Summary (Last 24 hours) at 08/25/2020 1612 Last data filed at 08/25/2020 1256 Gross per 24 hour  Intake 930 ml  Output --  Net 930 ml        Physical Exam: Vital Signs Blood pressure 124/70, pulse 98, temperature 98.4 F (36.9 C), resp. rate 18, height 5\' 3"  (1.6 m), weight (!) 207.7 kg, SpO2 95 %.    General: awake, alert, appropriate, bariatric pt on low air loss bariatric mattress; BMI- 81;  NAD HENT: conjugate gaze; oropharynx moist; no teeth CV: regular rate; no JVD Pulmonary: CTA B/L; no W/R/R- good air movement- some hypoventilation GI: soft, NT, ND, (+)BS Psychiatric: appropriate- flat affect Neurological: alert-   Musculoskeletal:  Cervical back: Normal range of motionand neck supple.  Right lower leg: Edemapresent.  Left lower leg: Edemapresent.  Comments: UEs 4+/5 in biceps, triceps, WE, grip and finger and was 4-/5 B/L LEs:- HF 4-/5 , KE 4/5 on R and 4+/5 on L; DF and PF 5-/5 B/L  Skin: General: Skin is warmand dry.  Comments: Pt's buttocks didn't have wounds- healed However has multiple weeping wounds underneath pannus on abdomen and  1 very large plaque appearing lesion- A few small wounds between thighs that were open/weeping, however had multiple wounds that had coalesced on the back of her thighs, near crease of buttocks that were open/weeping/pink- healing better on thighs than under pannus- strong odor of yeast.no change when assessed today, except interdry in place Neurological:  Mental Status: She is alertand oriented to person, place, and time.  Comments: Intact to light touch in UEs and LLE; decreased with pins/needles/light touch from mid calf/shin to toes on RLE- otherwise intact   Assessment/Plan: 1. Functional deficits which require 3+ hours per day of interdisciplinary therapy in a comprehensive inpatient rehab setting.  Physiatrist is providing [place] team supervision and 24 hour management of active medical problems listed below.  Physiatrist and rehab team continue to assess barriers to discharge/monitor patient progress toward functional and medical goals  Care Tool:  Bathing    Body parts bathed by patient: Right arm,Left arm,Chest,Face   Body parts bathed by helper: Abdomen,Front perineal area,Buttocks,Right upper leg,Left upper leg,Right lower leg,Left lower leg     Bathing assist Assist Level: 2 Helpers  Upper Body Dressing/Undressing Upper body dressing   What is the patient wearing?: Dress    Upper body assist Assist Level: Moderate Assistance - Patient 50 - 74%    Lower Body Dressing/Undressing Lower body dressing      What is the patient wearing?: Incontinence brief     Lower body assist Assist for lower body dressing: 2 Helpers     Toileting Toileting    Toileting assist Assist for toileting: Total Assistance - Patient < 25%     Transfers Chair/bed transfer  Transfers assist  Chair/bed transfer activity did not occur: Safety/medical concerns  Chair/bed transfer assist level: 2 Helpers     Locomotion Ambulation   Ambulation assist   Ambulation  activity did not occur: Safety/medical concerns          Walk 10 feet activity   Assist  Walk 10 feet activity did not occur: Safety/medical concerns        Walk 50 feet activity   Assist Walk 50 feet with 2 turns activity did not occur: Safety/medical concerns         Walk 150 feet activity   Assist Walk 150 feet activity did not occur: Safety/medical concerns         Walk 10 feet on uneven surface  activity   Assist Walk 10 feet on uneven surfaces activity did not occur: Safety/medical concerns         Wheelchair     Assist     Wheelchair activity did not occur: Safety/medical concerns         Wheelchair 50 feet with 2 turns activity    Assist    Wheelchair 50 feet with 2 turns activity did not occur: Safety/medical concerns       Wheelchair 150 feet activity     Assist  Wheelchair 150 feet activity did not occur: Safety/medical concerns       Blood pressure 124/70, pulse 98, temperature 98.4 F (36.9 C), resp. rate 18, height 5\' 3"  (1.6 m), weight (!) 207.7 kg, SpO2 95 %.   Medical Problem List and Plan: 1.Debilitysecondary topanniculitis and SIRS -patient may shower -ELOS/Goals: 8-`11 days- supervision  -con't PT and OT-  2. Antithrombotics: -DVT/anticoagulation:Pharmaceutical:Lovenox -antiplatelet therapy: N/A 3.Chronic back pain/Pain Management:Used Mmobic and robaxin. Continue tylenol prn 4. Mood:LCSW to follow for evaluation and support. -antipsychotic agents: N/A 5. Neuropsych: This patientiscapable of making decisions on herown behalf. 6. Skin/Wound Care:Routine pressure relief measures.Foley removed- is voiding --Continue Silvadene and Interdry per WOC recommendations.  --is Malnourished with albumin 2.1. Will add collagen and protein to promote wound healing --Add vitamin C, Zinc and multivitamin as  supplements.  7. Fluids/Electrolytes/Nutrition:Monitor I/O. Check lytes in am.  8. Acute on chronic renal failure: Improved from 2.0-->1.45 9. Hyponatremia: Will continue to monitor. Question dilutional  10. Anemia of chronic diease: Monitor for signs of bleeding. H/H has been 7-8 range.  11. COPD/OHS/OSA: Has declinedCPAP use. Encourage pulmonary hygeine. On Singulair. 12. Schizoaffective bipolar d/o: Continue Wellbutrin with cymbalta and buspar.. 13. RLS: On Requip. 14. Super Morbid obesity: Educate on appropriate diet, self care and weight loss to help promote mobility and overall health. 15. Chronic Insomnia: Seroquel started a month ago-caused dizziness- not working. Will d/w pt in AM what she's tried and see if can try anything.  5/20- will try trazodone 25 mg q8pm 16. Renal mass: Was set to see [facility] at [facility] urology--had toberescheduled. 17. Panniculitis- with open weeping wounds- will con't WOC recs with interdry and wound care-  might need some Diflucan to help with yeast overgrowth.   5/20- will order Diflucan x 1 week 18. Dizziness/lightheadedness- low BP when standing? Will check orthostatics.  5/20- could be from her seroquel- if orthostatics Negative, that's why.       LOS: 1 days A FACE TO FACE EVALUATION WAS PERFORMED  Cambell Rickenbach 08/25/2020, 4:12 PM

## 2020-08-25 NOTE — Progress Notes (Signed)
Patient ID: Heather [name], female   DOB: 03/24/1976, 44 y.o.   MRN: [id number]  Met with patient, introduced myself, and explained my role in her care. Gave her additional information on the DASH diet, cooking with less salt, heart-healthy eating plan, managing depression, high cholesterol, preventing high cholesterol, COPD, and wound care. We discussed her current situation, the chronic pain in her back, and concern for her twins. She is a very hard worker and very emotional. I will continue to monitor her progress.   , RN3, BSN, CBIS, CRRN, WTA Care Coordinator, Inpatient Rehabilitation Office [phone] Cell [phone] 

## 2020-08-26 DIAGNOSIS — N179 Acute kidney failure, unspecified: Secondary | ICD-10-CM

## 2020-08-26 DIAGNOSIS — G4700 Insomnia, unspecified: Secondary | ICD-10-CM

## 2020-08-26 DIAGNOSIS — M545 Low back pain, unspecified: Secondary | ICD-10-CM

## 2020-08-26 DIAGNOSIS — E871 Hypo-osmolality and hyponatremia: Secondary | ICD-10-CM

## 2020-08-26 DIAGNOSIS — D638 Anemia in other chronic diseases classified elsewhere: Secondary | ICD-10-CM

## 2020-08-26 DIAGNOSIS — G8929 Other chronic pain: Secondary | ICD-10-CM

## 2020-08-26 DIAGNOSIS — M793 Panniculitis, unspecified: Secondary | ICD-10-CM

## 2020-08-26 NOTE — Progress Notes (Signed)
PROGRESS NOTE   Subjective/Complaints: Patient seen laying in bed this morning.  She states she slept well overnight.  She states she had a good first day of therapies yesterday.  She states she feels as if her bladder is full and notes she was taking a diuretic at home.  ROS: Denies CP, SOB, N/V/D  Objective:   No results found. Recent Labs    08/24/20 0451 08/25/20 0611  WBC 6.1 5.4  HGB 8.3* 8.7*  HCT 29.4* 31.1*  PLT 412* 429*   Recent Labs    08/24/20 0451 08/25/20 0611  NA 133* 133*  K 3.9 3.9  CL 101 100  CO2 26 28  GLUCOSE 90 91  BUN 10 11  CREATININE 1.45* 1.47*  CALCIUM 8.9 9.1    Intake/Output Summary (Last 24 hours) at 08/26/2020 0854 Last data filed at 08/26/2020 0700 Gross per 24 hour  Intake 608 ml  Output 1000 ml  Net -392 ml        Physical Exam: Vital Signs Blood pressure (!) 113/58, pulse 82, temperature 97.9 F (36.6 C), resp. rate 20, height 5\' 3"  (1.6 m), weight (!) 207.7 kg, SpO2 94 %. Constitutional: No distress . Vital signs reviewed.  Super super obese. HENT: Normocephalic.  Atraumatic. Eyes: EOMI. No discharge. Cardiovascular: No JVD.  RRR. Respiratory: Normal effort.  No stridor.  Bilateral clear to auscultation. GI: Non-distended.  BS +. Skin: Warm and dry.   Wounds under pannus Psych: Normal mood.  Normal behavior. Musc: Generalized edema.  No tenderness in extremities. Neuro: Alert Motor: Bilateral upper extremities: 4+/5 proximal distal Bilateral lower extremities: Hip flexion, knee extension 4/5, ankle dorsiflexion 5/5  Assessment/Plan: 1. Functional deficits which require 3+ hours per day of interdisciplinary therapy in a comprehensive inpatient rehab setting.  Physiatrist is providing [place] team supervision and 24 hour management of active medical problems listed below.  Physiatrist and rehab team continue to assess barriers to discharge/monitor patient progress  toward functional and medical goals  Care Tool:  Bathing    Body parts bathed by patient: Right arm,Left arm,Chest,Face   Body parts bathed by helper: Abdomen,Front perineal area,Buttocks,Right upper leg,Left upper leg,Right lower leg,Left lower leg     Bathing assist Assist Level: 2 Helpers     Upper Body Dressing/Undressing Upper body dressing   What is the patient wearing?: Dress    Upper body assist Assist Level: Moderate Assistance - Patient 50 - 74%    Lower Body Dressing/Undressing Lower body dressing      What is the patient wearing?: Incontinence brief     Lower body assist Assist for lower body dressing: 2 Helpers     Toileting Toileting    Toileting assist Assist for toileting: Total Assistance - Patient < 25%     Transfers Chair/bed transfer  Transfers assist  Chair/bed transfer activity did not occur: Safety/medical concerns  Chair/bed transfer assist level: 2 Helpers     Locomotion Ambulation   Ambulation assist   Ambulation activity did not occur: Safety/medical concerns          Walk 10 feet activity   Assist  Walk 10 feet activity did not occur: Safety/medical concerns  Walk 50 feet activity   Assist Walk 50 feet with 2 turns activity did not occur: Safety/medical concerns         Walk 150 feet activity   Assist Walk 150 feet activity did not occur: Safety/medical concerns         Walk 10 feet on uneven surface  activity   Assist Walk 10 feet on uneven surfaces activity did not occur: Safety/medical concerns         Wheelchair     Assist     Wheelchair activity did not occur: Safety/medical concerns         Wheelchair 50 feet with 2 turns activity    Assist    Wheelchair 50 feet with 2 turns activity did not occur: Safety/medical concerns       Wheelchair 150 feet activity     Assist  Wheelchair 150 feet activity did not occur: Safety/medical concerns       Blood  pressure (!) 113/58, pulse 82, temperature 97.9 F (36.6 C), resp. rate 20, height 5\' 3"  (1.6 m), weight (!) 207.7 kg, SpO2 94 %.   Medical Problem List and Plan: 1.Debilitysecondary topanniculitis and SIRS  Continue CIR 2. Antithrombotics: -DVT/anticoagulation:Pharmaceutical:Lovenox -antiplatelet therapy: N/A 3.Chronic back pain/Pain Management:Used Mobic and robaxin. Continue tylenol prn  Controlled on 5/21 4. Mood:LCSW to follow for evaluation and support. -antipsychotic agents: N/A 5. Neuropsych: This patientiscapable of making decisions on herown behalf. 6. Skin/Wound Care:Routine pressure relief measures. --Continue Silvadene and Interdry per WOC recommendations.  --is Malnourished with albumin 2.1.  Added collagen and protein to promote wound healing --Add vitamin C, Zinc and multivitamin as supplements.  7. Fluids/Electrolytes/Nutrition:Monitor I/O.  8. Acute on chronic renal failure:   Creatinine 1.47 on 5/20, labs ordered for Monday 9. Hyponatremia: Will continue to monitor. Question dilutional   Sodium 133 on 5/20, labs ordered for Monday 10. Anemia of chronic diease: Monitor for signs of bleeding. H/H has been 7-8 range.   Hemoglobin 8.7 on 5/20  Continue to monitor 11. COPD/OHS/OSA: Has declinedCPAP use. Encourage pulmonary hygeine. On Singulair. 12. Schizoaffective bipolar d/o: Continue Wellbutrin with cymbalta and buspar.. 13. RLS: On Requip. 63. Super super obesity: Educate on appropriate diet, self care and weight loss to help promote mobility and overall health. 15. Chronic Insomnia: Seroquel started a month ago-caused dizziness and ineffective  5/20- will try trazodone 25 mg q8pm  Appears to be improving 16. Renal mass: Was set to see MD at [facility] urology--had toberescheduled. 17. Panniculitis- with open weeping wounds- will con't WOC recs with interdry and wound care- might need  some Diflucan to help with yeast overgrowth.   Diflucan x 1 week (5/26) 18. Dizziness/lightheadedness-   Improving      LOS: 2 days A FACE TO FACE EVALUATION WAS PERFORMED  Heather [name] Heather [name] 08/26/2020, 8:54 AM

## 2020-08-27 MED ORDER — COVID-19 MRNA VACC (MODERNA) 100 MCG/0.5ML IM SUSP
0.5000 mL | Freq: Once | INTRAMUSCULAR | Status: DC
  Filled 2020-08-27: qty 0.5

## 2020-08-27 NOTE — Progress Notes (Signed)
Physical Therapy Session Note  Patient Details  Name: Heather [name] MRN: [id number] Date of Birth: 09/27/1975  Today's Date: 08/27/2020 PT Individual Time: 0800-0900; 1300-1345 PT Individual Time Calculation (min): 60 min and 45 min  Short Term Goals: Week 1:  PT Short Term Goal 1 (Week 1): pt to demonstrate supine<>sit min A consistently PT Short Term Goal 2 (Week 1): pt to demonstrate sit<>stand transfer with LRAD mod A x1 consistently PT Short Term Goal 3 (Week 1): pt to demonstrate SPT with LRAD mod A x1 consistently PT Short Term Goal 4 (Week 1): pt to ambulate with LRAD 10' mod A x1  Skilled Therapeutic Interventions/Progress Updates:    Session 1: Pt received supine in bed, agreeable to PT session. No complaints of pain this date. Supine to sit with min A for some trunk elevation, use of bedrails. Deflated air mattress while pt seated EOB for improved stability and sitting balance. Sit to stand with min A to RW throughout session with R knee blocked and use of sheet around chest as gait belt. Pt reports urge to have a BM. Stand pivot transfer to [facility] with RW and min A, increased time needed to complete transfer. Pt able to continently void while seated on BSC. Pt is dependent for pericare in standing. Stand pivot transfer to w/c with min A and RW. Seated BLE strengthening therex: marches, LAQ, heel/toe raises. Pt agreeable to stay seated in w/c at end of session, needs in reach.  Session 2: Pt received seated in w/c in room, agreeable to PT session. No complaints of pain at rest, does have onset of R knee pain with standing. ACE-wrapped R knee for improved stability with mobility this date. Sit to stand with min A to RW throughout session with R knee blocked. Attempt to have patient perform alt L/R marches in standing, unable to tolerate SLS on RLE due to pain. Forward/backward ambulation with RW, 4 x 5 ft with min A with shuffling gait pattern. Seated BLE strengthening therex 2 x 10  reps each: marches, LAQ, hip add squeeze, heel/toe raises. Pt requests to return to bed at end of session as she has been sitting up most of the day. Stand pivot transfer w/c to bed with RW and min A. Sit to supine mod A needed for BLE management. Pt left supine in bed with needs in reach at end of session.  Therapy Documentation Precautions:  Precautions Precautions: Fall Precaution Comments: reports R knee buckling in standing, 2 previous recent falls. 3 in the past 6 months, skin breakdown in periareas Restrictions Weight Bearing Restrictions: No   Therapy/Group: Individual Therapy   Excell Seltzer, PT, DPT, CSRS  08/27/2020, 12:13 PM

## 2020-08-27 NOTE — Progress Notes (Signed)
Occupational Therapy Session Note  Patient Details  Name: Heather [name] MRN: [id number] Date of Birth: [birth year]  Today's Date: 08/27/2020 OT Individual Time: 9485-4627 OT Individual Time Calculation (min): 87 min   Short Term Goals: Week 1:  OT Short Term Goal 1 (Week 1): Pt will complete toilet transfer with no more than 2 assist and LRAD OT Short Term Goal 2 (Week 1): Pt will complete LB dressing at sit<stand level during 1 ADL session with 1 assist OT Short Term Goal 3 (Week 1): Pt will complete 1 grooming task while sitting at the sink to increase OOB tolerance  Skilled Therapeutic Interventions/Progress Updates:    Pt greeted in the w/c with no c/o pain. Agreeable to engage in bathing/dressing tasks. Set her up with wash/rinse basins on bedside table and she engaged in bathing/dressing tasks at sit<stand level using bariatric RW. She needed Min A to brace Rt knee only during power ups. Noted crepitus of knee joint. Initiated AE training using LH sponge, reacher, and extra wide sock aide today. Pt required Mod-Max A to use equipment efficiently with vcs. She required Mod A to thoroughly wash UB/LB excluding periareas, which RN stated that she would complete as pt needed medicinal creams/powders applied. Max A to elevate pants in standing with pt having 1 small LOB when removing an UE to assist. OT also washed her hair using the hair washing tray. Worked on UB strengthening/endurance while she combed/blow-dried hair. Mod A to thoroughly use the blow-dryer due to arm fatigue. Tried without success to adjust leg rests for comfort/support. Used the small step in her room with dycem for supporting feet while she was sitting in the w/c. Also fastened her seat belt. Note that she needed to reposition herself often in the w/c as she appeared to slide forward in the chair. All needs within reach prior to departure, pt reporting feeling much better after bathing.   Therapy  Documentation Precautions:  Precautions Precautions: Fall Precaution Comments: reports R knee buckling in standing, 2 previous recent falls. 3 in the past 6 months, skin breakdown in periareas Restrictions Weight Bearing Restrictions: No Vital Signs: Therapy Vitals Temp: 99.6 F (37.6 C) Pulse Rate: (!) 101 Resp: 19 BP: 108/81 Patient Position (if appropriate): Sitting Oxygen Therapy SpO2: 100 % O2 Device: Room Air ADL: ADL Grooming: Setup Where Assessed-Grooming: Edge of bed Upper Body Bathing: Moderate assistance Where Assessed-Upper Body Bathing: Edge of bed Lower Body Bathing: Other (comment) (+2 assist) Where Assessed-Lower Body Bathing: Edge of bed,Bed level Upper Body Dressing: Moderate assistance Where Assessed-Upper Body Dressing: Edge of bed,Bed level Lower Body Dressing: Dependent Where Assessed-Lower Body Dressing: Bed level Toileting: Not assessed Toilet Transfer: Not assessed Tub/Shower Transfer: Not assessed    Therapy/Group: Individual Therapy  Terilynn Buresh A Tamyrah Burbage 08/27/2020, 12:42 PM

## 2020-08-28 LAB — CBC
HCT: 31.3 % — ABNORMAL LOW (ref 36.0–46.0)
Hemoglobin: 8.8 g/dL — ABNORMAL LOW (ref 12.0–15.0)
MCH: 22.4 pg — ABNORMAL LOW (ref 26.0–34.0)
MCHC: 28.1 g/dL — ABNORMAL LOW (ref 30.0–36.0)
MCV: 79.8 fL — ABNORMAL LOW (ref 80.0–100.0)
Platelets: 457 10*3/uL — ABNORMAL HIGH (ref 150–400)
RBC: 3.92 MIL/uL (ref 3.87–5.11)
RDW: 24.5 % — ABNORMAL HIGH (ref 11.5–15.5)
WBC: 8.4 10*3/uL (ref 4.0–10.5)
nRBC: 0 % (ref 0.0–0.2)

## 2020-08-28 LAB — BASIC METABOLIC PANEL
Anion gap: 7 (ref 5–15)
BUN: 13 mg/dL (ref 6–20)
CO2: 28 mmol/L (ref 22–32)
Calcium: 9.3 mg/dL (ref 8.9–10.3)
Chloride: 100 mmol/L (ref 98–111)
Creatinine, Ser: 1.83 mg/dL — ABNORMAL HIGH (ref 0.44–1.00)
GFR, Estimated: 35 mL/min — ABNORMAL LOW (ref >60)
Glucose, Bld: 97 mg/dL (ref 70–99)
Potassium: 3.6 mmol/L (ref 3.5–5.1)
Sodium: 135 mmol/L (ref 135–145)

## 2020-08-28 MED ORDER — SENNOSIDES-DOCUSATE SODIUM 8.6-50 MG PO TABS
2.0000 | ORAL_TABLET | Freq: Every day | ORAL | Status: DC
  Administered 2020-08-28 – 2020-09-14 (×17): 2 via ORAL
  Filled 2020-08-28 (×18): qty 2

## 2020-08-28 MED ORDER — COVID-19 MRNA VAC-TRIS(PFIZER) 30 MCG/0.3ML IM SUSP
0.3000 mL | Freq: Once | INTRAMUSCULAR | Status: AC
  Administered 2020-08-28: 0.3 mL via INTRAMUSCULAR
  Filled 2020-08-28: qty 0.3

## 2020-08-28 NOTE — Progress Notes (Addendum)
PROGRESS NOTE   Subjective/Complaints:  Pt reports thinks she retaining fluid/i.e in her legs- took diuretic at home, but PCP took her off it before admission.  Was independid - was diuretic? Not sure of name?  Also feels constipated- had BM yesterday, but it's hard balls and painful to push out.   Said stood and walked short distances in room yesterday.   ROS:  Pt denies SOB, abd pain, CP, N/V/C/D, and vision changes   Objective:   No results found. Recent Labs    08/28/20 0627  WBC 8.4  HGB 8.8*  HCT 31.3*  PLT 457*   Recent Labs    08/28/20 0627  NA 135  K 3.6  CL 100  CO2 28  GLUCOSE 97  BUN 13  CREATININE 1.83*  CALCIUM 9.3    Intake/Output Summary (Last 24 hours) at 08/28/2020 0849 Last data filed at 08/27/2020 1820 Gross per 24 hour  Intake 500 ml  Output --  Net 500 ml        Physical Exam: Vital Signs Blood pressure 109/74, pulse 88, temperature 98 F (36.7 C), temperature source Oral, resp. rate 19, height 5\' 3"  (1.6 m), weight (!) 204.6 kg, SpO2 97 %.    General: awake, alert, appropriate, BMI down slightly to 79.89; laying in bariatric low air loss mattress; NAD HENT: conjugate gaze; oropharynx moist- edentulous CV: regular rate; no JVD Pulmonary: CTA B/L; no W/R/R- good air movement GI: soft, NT, ND, (+)BS Psychiatric: appropriate; flat affect- eating breakfast;  Neurological: alert Skin: Warm and dry.   Wounds under pannus- interdry in place-  Musc: LE's- no edema palpated- in feet or ankles- tissue soft, not fluid filled feeling.  Neuro: Alert Motor: Bilateral upper extremities: 4+/5 proximal distal Bilateral lower extremities: Hip flexion, knee extension 4/5, ankle dorsiflexion 5/5  Assessment/Plan: 1. Functional deficits which require 3+ hours per day of interdisciplinary therapy in a comprehensive inpatient rehab setting.  Physiatrist is providing Zettler team supervision  and 24 hour management of active medical problems listed below.  Physiatrist and rehab team continue to assess barriers to discharge/monitor patient progress toward functional and medical goals  Care Tool:  Bathing    Body parts bathed by patient: Right arm,Left arm,Chest,Face   Body parts bathed by helper: Abdomen,Right upper leg,Left upper leg,Right lower leg,Left lower leg Body parts n/a: Front perineal area,Buttocks   Bathing assist Assist Level: Moderate Assistance - Patient 50 - 74%     Upper Body Dressing/Undressing Upper body dressing   What is the patient wearing?: Pull over shirt    Upper body assist Assist Level: Minimal Assistance - Patient > 75%    Lower Body Dressing/Undressing Lower body dressing      What is the patient wearing?: Pants     Lower body assist Assist for lower body dressing: Maximal Assistance - Patient 25 - 49%     Toileting Toileting    Toileting assist Assist for toileting: 2 Helpers     Transfers Chair/bed transfer  Transfers assist  Chair/bed transfer activity did not occur: Safety/medical concerns  Chair/bed transfer assist level: Minimal Assistance - Patient > 75%     Locomotion Ambulation  Ambulation assist   Ambulation activity did not occur: Safety/medical concerns          Walk 10 feet activity   Assist  Walk 10 feet activity did not occur: Safety/medical concerns        Walk 50 feet activity   Assist Walk 50 feet with 2 turns activity did not occur: Safety/medical concerns         Walk 150 feet activity   Assist Walk 150 feet activity did not occur: Safety/medical concerns         Walk 10 feet on uneven surface  activity   Assist Walk 10 feet on uneven surfaces activity did not occur: Safety/medical concerns         Wheelchair     Assist     Wheelchair activity did not occur: Safety/medical concerns         Wheelchair 50 feet with 2 turns activity    Assist     Wheelchair 50 feet with 2 turns activity did not occur: Safety/medical concerns       Wheelchair 150 feet activity     Assist  Wheelchair 150 feet activity did not occur: Safety/medical concerns       Blood pressure 109/74, pulse 88, temperature 98 F (36.7 C), temperature source Oral, resp. rate 19, height 5\' 3"  (1.6 m), weight (!) 204.6 kg, SpO2 97 %.   Medical Problem List and Plan: 1.Debilitysecondary topanniculitis and SIRS  con't PT and OT 2. Antithrombotics: -DVT/anticoagulation:Pharmaceutical:Lovenox -antiplatelet therapy: N/A 3.Chronic back pain/Pain Management:Used Mobic and robaxin. Continue tylenol prn  C5/23- con't PT and OT 4. Mood:LCSW to follow for evaluation and support. -antipsychotic agents: N/A 5. Neuropsych: This patientiscapable of making decisions on herown behalf. 6. Skin/Wound Care:Routine pressure relief measures. --Continue Silvadene and Interdry per WOC recommendations.  --is Malnourished with albumin 2.1.  Added collagen and protein to promote wound healing --Add vitamin C, Zinc and multivitamin as supplements.  7. Fluids/Electrolytes/Nutrition:Monitor I/O.  8. Acute on chronic renal failure:   Creatinine 1.47 on 5/20, labs ordered for Monday  5/23- Cr up to 1.86- will encourage lfuids and recheck Wednesday 9. Hyponatremia: Will continue to monitor. Question dilutional   Sodium 133 on 5/20, labs ordered for Monday  5/23- Na up to 135- con't to monitor 10. Anemia of chronic diease: Monitor for signs of bleeding. H/H has been 7-8 range.   Hemoglobin 8.7 on 5/20  5/23- Hb 8.8- con't ot monitor  Continue to monitor 11. COPD/OHS/OSA: Has declinedCPAP use. Encourage pulmonary hygeine. On Singulair. 12. Schizoaffective bipolar d/o: Continue Wellbutrin with cymbalta and buspar.. 13. RLS: On Requip. 2. Super super obesity: Educate on appropriate diet, self care  and weight loss to help promote mobility and overall health.  5/23- weight down some to 204.6 kg- con't hospital diet.  15. Chronic Insomnia: Seroquel started a month ago-caused dizziness and ineffective  5/20- will try trazodone 25 mg q8pm  Appears to be improving 16. Renal mass: Was set to see MD at [facility]--had toberescheduled. 17. Panniculitis- with open weeping wounds- will con't WOC recs with interdry and wound care- might need some Diflucan to help with yeast overgrowth.   Diflucan x 1 week (5/26) 18. Dizziness/lightheadedness-   Improving 19. ?Edema?  5/23- I don't see actual edema on exam- tissues soft, nonpitting- will wait on any diuretics, esp because Cr up to 1.86 20. Pt asked for COVID vaccine- will give today 21. Constipation  5/23- will add Senokot 2 tabs daily-  LOS: 4 days A FACE TO FACE EVALUATION WAS PERFORMED  Hydia Copelin 08/28/2020, 8:49 AM

## 2020-08-28 NOTE — Progress Notes (Signed)
Physical Therapy Session Note  Patient Details  Name: Heather [name] MRN: [id number] Date of Birth: 17-Oct-1975  Today's Date: 08/28/2020 PT Individual Time: 3500-9381 PT Individual Time Calculation (min): 61 min   Short Term Goals: Week 1:  PT Short Term Goal 1 (Week 1): pt to demonstrate supine<>sit min A consistently PT Short Term Goal 2 (Week 1): pt to demonstrate sit<>stand transfer with LRAD mod A x1 consistently PT Short Term Goal 3 (Week 1): pt to demonstrate SPT with LRAD mod A x1 consistently PT Short Term Goal 4 (Week 1): pt to ambulate with LRAD 10' mod A x1  Skilled Therapeutic Interventions/Progress Updates:    pt received in bed and agreeable to therapy however declined to transfer to [facility] reporting the one in her room was too uncomfortable. Pt also reported she was very tired during this time 2/2 increased time OOB over the weekend. Pt requesting new WC for improved tolerance to OOB. PT retrieved manual WC from DME room, once returned to room, pt reported she was too tired to complete WC transfer this date and requested to do activity in bed. Pt directed in supine bed exercises with orange theraband for overhead press, chest pulls, bicep curls, tricep curls, front raises 2x10 and BLE hip abduction, adduction, anke pumps, heel slides 2x10. Pt educated on importance of mobility with therapy, increasing OOB tolerance, increased I with bed mob, transfers, and gait with PT. Pt agreeable however reported she "just had an emotional conversation with my friends" and would like to rest. Pt left in bed, All needs in reach and in good condition. Call light in hand.    Therapy Documentation Precautions:  Precautions Precautions: Fall Precaution Comments: reports R knee buckling in standing, 2 previous recent falls. 3 in the past 6 months, skin breakdown in periareas Restrictions Weight Bearing Restrictions: No General: PT Amount of Missed Time (min): 14 Minutes PT Missed Treatment  Reason: Patient fatigue Vital Signs:   Pain:   Mobility:   Locomotion :    Trunk/Postural Assessment :    Balance:   Exercises:   Other Treatments:      Therapy/Group: Individual Therapy  [name] 08/28/2020, 3:22 PM

## 2020-08-28 NOTE — Progress Notes (Signed)
Occupational Therapy Session Note  Patient Details  Name: Heather [name] MRN: [id number] Date of Birth: 1976/03/11  Today's Date: 08/28/2020 OT Individual Time: 9689-5702 OT Individual Time Calculation (min): 74 min    Short Term Goals: Week 1:  OT Short Term Goal 1 (Week 1): Pt will complete toilet transfer with no more than 2 assist and LRAD OT Short Term Goal 2 (Week 1): Pt will complete LB dressing at sit<stand level during 1 ADL session with 1 assist OT Short Term Goal 3 (Week 1): Pt will complete 1 grooming task while sitting at the sink to increase OOB tolerance   Skilled Therapeutic Interventions/Progress Updates:    Pt greeted at time of session supine in bed resting, agreeable to OT session. Supine > sit Min A for LE management, sitting EOB approx 30 minutes for ADL of sponge bathing, pt only wanting to wash UB and declined LB today since she "did that yesterday." Discussion for problem solving how to sponge bathe at home, pt sits on commode by sink for sponge bath, determined she will need to be bed level for periarea hygiene. Therapist assisting for washing in skin folds, soap and water + drying thoroughly, applied powder as well. Donned new gown Min A. Pt needing to toilet at this time, donned socks for the pt d/t urgency, set up with AD and sit > stand Mod A with R knee block and stand pivot Min A to BSC, total A for doffing brief. Pt did have BM on BSC and (+) urine as well, dependent hygiene with pt in standing. Sit > stands throughout with Min/Mod and R knee block. Stand pivot back to bed Min A. Donned brief rolling L/R total A with rolling Min/Mod. Call bell in reach all needs met.     Therapy Documentation Precautions:  Precautions Precautions: Fall Precaution Comments: reports R knee buckling in standing, 2 previous recent falls. 3 in the past 6 months, skin breakdown in periareas Restrictions Weight Bearing Restrictions: No    Therapy/Group: Individual  Therapy  Viona Gilmore 08/28/2020, 7:18 AM

## 2020-08-28 NOTE — Progress Notes (Signed)
Occupational Therapy Session Note  Patient Details  Name: Heather [name] MRN: [id number] Date of Birth: October 04, 1975  Today's Date: 08/28/2020 OT Individual Time: 1962-2297 OT Individual Time Calculation (min): 38 min   Short Term Goals: Week 1:  OT Short Term Goal 1 (Week 1): Pt will complete toilet transfer with no more than 2 assist and LRAD OT Short Term Goal 2 (Week 1): Pt will complete LB dressing at sit<stand level during 1 ADL session with 1 assist OT Short Term Goal 3 (Week 1): Pt will complete 1 grooming task while sitting at the sink to increase OOB tolerance  Skilled Therapeutic Interventions/Progress Updates:    Pt greeted in bed with no c/o pain. Agreeable to start session by trialing standard sock aide to see if she preferred this over the extra wide sock aide (per conversation with this therapist during previous session). Supine<sit completed with setup assistance and then pt doffed gripper socks using the reacher. She tried the standard sock aide but was unable to get her foot to fit inside of the device. Therefore she donned new socks using the extra wide sock aide given min cuing. She then reported needing to void bladder. CGA for sit<stand for bracing Rt knee only and then pt pivoted using the bariatric RW to the [facility]. Pt with +bladder void. We had to return to bed to complete perihygiene due to body habitus. Pt rolled Rt>Lt with Mod-Max A in order for OT to position her chuck pads/brief. Pt was left comfortably in bed at Gair of session, all needs within reach and 4 bedrails up of air mattress.   Therapy Documentation Precautions:  Precautions Precautions: Fall Precaution Comments: reports R knee buckling in standing, 2 previous recent falls. 3 in the past 6 months, skin breakdown in periareas Restrictions Weight Bearing Restrictions: No ADL: ADL Grooming: Setup Where Assessed-Grooming: Edge of bed Upper Body Bathing: Moderate assistance Where Assessed-Upper Body  Bathing: Edge of bed Lower Body Bathing: Other (comment) (+2 assist) Where Assessed-Lower Body Bathing: Edge of bed,Bed level Upper Body Dressing: Moderate assistance Where Assessed-Upper Body Dressing: Edge of bed,Bed level Lower Body Dressing: Dependent Where Assessed-Lower Body Dressing: Bed level Toileting: Not assessed Toilet Transfer: Not assessed Tub/Shower Transfer: Not assessed      Therapy/Group: Individual Therapy  Sim Choquette A Boby Eyer 08/28/2020, 4:11 PM

## 2020-08-29 ENCOUNTER — Inpatient Hospital Stay (HOSPITAL_COMMUNITY): Payer: Medicaid Other

## 2020-08-29 MED ORDER — PROMETHAZINE HCL 12.5 MG PO TABS
25.0000 mg | ORAL_TABLET | Freq: Four times a day (QID) | ORAL | Status: DC | PRN
  Administered 2020-08-29 – 2020-08-31 (×4): 25 mg via ORAL
  Filled 2020-08-29 (×4): qty 2

## 2020-08-29 MED ORDER — LIDOCAINE HCL URETHRAL/MUCOSAL 2 % EX GEL
1.0000 "application " | CUTANEOUS | Status: DC | PRN
  Filled 2020-08-29: qty 5

## 2020-08-29 NOTE — Progress Notes (Addendum)
Patient ID: Aspasia Rude Urbieta, female   DOB: 09/19/1975, 45 y.o.   MRN: [id number] Team Conference Report to Patient/Family  Team Conference discussion was reviewed with the patient and caregiver, including goals, any changes in plan of care and target discharge date.  Patient and caregiver express understanding and are in agreement.  The patient has a target discharge date of 09/13/20.  SW met with pt and church member in pt room. Provided pt with updates. SW updated by church member present to assist patient with getting documentation signed to have her children evaluated in school. Church member assisting pt and mother with children while pt here at hospital. Sw informed by church member that pt is not allowed to return to hotel under their cost. Pt has been informed by church member before admission, that she would not be allowed to return to hotel unless paying for cost due to neglect of her children and being physically unable to care for herself. Pt has informed church member that she will be allowed to be extended here at [facility] or obtain SNF placement, sw informed pt and church member that this is not guarantee. At admission, pt dicussed with AD of plan for a short-stay and to return to motel with children and mother.  Church member will discuss with pastor about potentially of using pt funds to cover cost of motel, sw will cont to follow up. No additional questions or concerns  Dyanne Iha 08/29/2020, 1:55 PM

## 2020-08-29 NOTE — Progress Notes (Signed)
Physical Therapy Session Note  Patient Details  Name: Heather [name] MRN: [id number] Date of Birth: 25-Feb-1976  Today's Date: 08/29/2020 PT Individual Time: 1030-1120 and 1300-1330 PT Individual Time Calculation (min): 50 min 39mins  Short Term Goals: Week 1:  PT Short Term Goal 1 (Week 1): pt to demonstrate supine<>sit min A consistently PT Short Term Goal 2 (Week 1): pt to demonstrate sit<>stand transfer with LRAD mod A x1 consistently PT Short Term Goal 3 (Week 1): pt to demonstrate SPT with LRAD mod A x1 consistently PT Short Term Goal 4 (Week 1): pt to ambulate with LRAD 10' mod A x1  Skilled Therapeutic Interventions/Progress Updates:    session 1: pt received in bed and agreeable to therapy. Pt reported x2 episodes of vomiting this AM and felt nauseous at this time. Pt was agreeable to bed level exercises but reported multiple times she felt she was unable to sit upright without getting sick again. Pt directed in supine bed exercises 2x10 ankle pumps, heel slides, hip abduction, glute squeezes, leg lifts, chest press, front raises with body weight resistance and bicep curls, tricep curls, chest pulls with orange theraband resistance. Pt required rest breaks intermittently 2/2 fatigue and to rest to relieve nausea. Pt given cool cloth to assist which she reported helped. Pt unable to tolerate full session of therapy 2/2 nausea and fatigue and requested to remain in bed. Pt missed 10 mins of PT. Pt left in bed, All needs in reach and in good condition. Call light in hand.    Session 2: pt received in bed and agreeable to therapy. Pt reported feeling better than AM session. Pt directed in supine>sit min a with step tool in place for safety for pt to improve positioning and then able to touch ground. Pt then reported she felt she needed to use restroom and room setup for [facility] use. Pt then directed in step transfer to [facility] with Rolling walker min A to complete. Pt voided bladder and min A for  hygiene. Pt then directed in Stand pivot transfer to [facility] with Rolling walker min A and given cloths at setup A for hand hygiene and washing face. Pt left in WC, All needs in reach and in good condition. Call light in hand.  Nursing aware.   Therapy Documentation Precautions:  Precautions Precautions: Fall Precaution Comments: reports R knee buckling in standing, 2 previous recent falls. 3 in the past 6 months, skin breakdown in periareas Restrictions Weight Bearing Restrictions: No General: PT Amount of Missed Time (min): 10 Minutes PT Missed Treatment Reason: Patient fatigue;Patient ill (Comment) Vital Signs: Therapy Vitals Temp: 99.9 F (37.7 C) Temp Source: Oral Pulse Rate: (!) 111 Resp: 16 BP: (!) 100/57 Patient Position (if appropriate): Lying Oxygen Therapy SpO2: 95 % O2 Device: Room Air Pain:   Mobility:   Locomotion :    Trunk/Postural Assessment :    Balance:   Exercises:   Other Treatments:      Therapy/Group: Individual Therapy  [name] 08/29/2020, 3:31 PM

## 2020-08-29 NOTE — Patient Care Conference (Signed)
Inpatient RehabilitationTeam Conference and Plan of Care Update Date: 08/29/2020   Time: 11:38 AM    Patient Name: Heather [name]      Medical Record Number: [id number]  Date of Birth: 13-Oct-1975 Sex: Female         Room/Bed: 4M08C/4M08C-01 Payor Info: Payor: MEDICAID [place] / Plan: MEDICAID [place] ACCESS / Product Type: *No Product type* /    Admit Date/Time:  08/24/2020  4:02 PM  Primary Diagnosis:  [facility] Problems: Principal Problem:   Debility Active Problems:   Panniculitis   Class 3 severe obesity with serious comorbidity and body mass index (BMI) greater than or equal to 70 in adult [facility])   Physical debility   Insomnia   Anemia of chronic disease   Hyponatremia   AKI (acute kidney injury) ([place])   Chronic bilateral low back pain without sciatica   Super-super obese [facility])    Expected Discharge Date: Expected Discharge Date: 09/13/20  Team Members Present: Physician leading conference: Dr. Courtney Heys Care Coodinator Present: Dorthula Nettles, RN, BSN, CRRN;Christina [place], BSW Nurse Present: Dorthula Nettles, RN PT Present: Stacy Gardner, PT OT Present: Lillia Corporal, OT     Current Status/Progress Goal Weekly Team Focus  Bowel/Bladder   Patient is currently continent of bowel and bladder with episodes of incontinence of urine at HS. LBM 08/28/2020.  Patient will remain continent x2.  Continue Q2H toileting and offer toileting PRN.   Swallow/Nutrition/ Hydration             ADL's   total A hygiene/clothing management, Min A stand pivots with AD, fatigues very quickly, bathing EOB  Min LB ADLs/toileting, CGA transfers  standing balance/tolerance, sit <> stands at AD, ADL retraining, problem solve hygiene/toileting, endurance, energy conservation   Mobility   mod A- min A bed mobility; transfers min A-mod A, <10' gait with RW  min A gait 25'-50'; supervision WC; supervision bed mob; CGA transfers  inc tol to activity and OOB mobility; transfers;  gait   Communication             Safety/Cognition/ Behavioral Observations            Pain   Patient states her pain is 0/10.  Patient will continue to have a pain level of less than 4/10.  Administer PRN pain medications per order.   Skin   Patient currently has wounds/rash to bilateral abdominal folds.  Patient's skin will remain free from additional breakdown with healing to current wounds.  Assess skin Qshift and PRN. Provide treatment to abdominal folds per order.     Discharge Planning:  Discharging back to motel wither mother to provide supervision, 24/7   Team Discussion: Patient had N/V this morning, added Phenergan, Cr is elevated, ordered I&O caths BID due to retention, lidocaine ordered as well. Patient has quiet bowel sounds. Attempting timed toileting schedule, patient has multiple areas of skin breakdown due to panniculitis. Using antifungal powder, interdry, silvadene, diflucan. Patient to discharge back to motel with mother and children. Mother will be available to provide supervision assistance. Patient on target to meet rehab goals: Got to [facility], have to block the right knee due to buckling. Total assist for hygiene. Fatigues quickly, max to total assist for bathing. Limited today due to nausea, also some self-limiting. Poor activity tolerance.  *See Care Plan and progress notes for long and short-term goals.   Revisions to Treatment Plan:  Phenergan added, KUB pending for N/V.  Teaching Needs: Family education, medication management,  skin/wound care, hygiene management, bowel/bladder management, transfer training, gait training, balance training, endurance training, safety awareness.  Current Barriers to Discharge: Decreased caregiver support, Medical stability, Home enviroment access/layout, Incontinence, Neurogenic bowel and bladder, Wound care, Lack of/limited family support, Weight, Medication compliance and Behavior  Possible Resolutions to Barriers: Continue  current medications, provide emotional support.     Medical Summary Current Status: N/V- has had BM in last 24 hours; incontinent of B/B- added timed voiding and not voiding a lot at all- Cr is elevated. On Dilfucan silvadene, antifungal powder and interdry for panniculitits.  Barriers to Discharge: Decreased family/caregiver support;Home enviroment access/layout;Incontinence;Neurogenic Bowel & Bladder;Medical stability;Weight;Wound care  Barriers to Discharge Comments: going to motel- mother can only give Supervision; cannot reach to do hygiene. needs A with toileting;  fatigues easily; might have to do stairs? Possible Resolutions to [employer]: phenergan added; KUB pending for N/V; in/out caths for urinary retention- q12 hours;  limited today due to N/V; self limiting; got COVID vaccine- cannot reach front/back for hygiene with toileting.   d/c 6/8   Continued Need for Acute Rehabilitation Level of Care: The patient requires daily medical management by a physician with specialized training in physical medicine and rehabilitation for the following reasons: Direction of a multidisciplinary physical rehabilitation program to maximize functional independence : Yes Medical management of patient stability for increased activity during participation in an intensive rehabilitation regime.: Yes Analysis of laboratory values and/or radiology reports with any subsequent need for medication adjustment and/or medical intervention. : Yes   I attest that I was present, lead the team conference, and concur with the assessment and plan of the team.   Cristi Loron 08/29/2020, 4:56 PM

## 2020-08-29 NOTE — Progress Notes (Signed)
Occupational Therapy Session Note  Patient Details  Name: Heather [name] MRN: [id number] Date of Birth: 02/23/1976  Today's Date: 08/29/2020 OT Individual Time: 1445-1525 OT Individual Time Calculation (min): 40 min    Short Term Goals: Week 1:  OT Short Term Goal 1 (Week 1): Pt will complete toilet transfer with no more than 2 assist and LRAD OT Short Term Goal 2 (Week 1): Pt will complete LB dressing at sit<stand level during 1 ADL session with 1 assist OT Short Term Goal 3 (Week 1): Pt will complete 1 grooming task while sitting at the sink to increase OOB tolerance   Skilled Therapeutic Interventions/Progress Updates:    Pt greeted at time of session supine in bed resting stating she did not feel great but a little better than Am session, agreeable to OT with encouragement. Pt education on importance of participation, spending time OOB, sitting EOB, standing, etc.  In order to go home and pt stating she "wants to get better" despite limited participation. Upon returning to room with weight, RN getting vital signs. After vitals, agreeable to bed level activity only, performed 4x20 ball hits with 2# dowel semireclined in bed with rest breaks between sets. Also extensive discussion with the pt regarding ELOS, DME needs (has BSC with bucket at hotel), etc. Call bell in reach all needs met.   Therapy Documentation Precautions:  Precautions Precautions: Fall Precaution Comments: reports R knee buckling in standing, 2 previous recent falls. 3 in the past 6 months, skin breakdown in periareas Restrictions Weight Bearing Restrictions: No     Therapy/Group: Individual Therapy   C  08/29/2020, 11:21 AM 

## 2020-08-29 NOTE — Progress Notes (Signed)
PROGRESS NOTE   Subjective/Complaints:  Pt reports very nauseated this AM- feels like will vomit- getting anti-nausea medicine after report by nursing.   Slept well, but woke up ill- Did have BM yesterday-  She thinks Nausea from [place] shot she received yesterday, which is possible, however doesn't usually cause nausea.   ROS:   Pt denies SOB, abd pain, CP, N/V/C/D, and vision changes    Objective:   No results found. Recent Labs    08/28/20 0627  WBC 8.4  HGB 8.8*  HCT 31.3*  PLT 457*   Recent Labs    08/28/20 0627  NA 135  K 3.6  CL 100  CO2 28  GLUCOSE 97  BUN 13  CREATININE 1.83*  CALCIUM 9.3    Intake/Output Summary (Last 24 hours) at 08/29/2020 0901 Last data filed at 08/29/2020 0745 Gross per 24 hour  Intake 755 ml  Output --  Net 755 ml        Physical Exam: Vital Signs Blood pressure (!) 111/56, pulse 98, temperature 98.6 F (37 C), temperature source Oral, resp. rate 18, height 5\' 3"  (1.6 m), weight (!) 204.6 kg, SpO2 92 %.     General: awake, alert, appropriate, laying on bariatric low air loss mattress;  NAD HENT: conjugate gaze; oropharynx moist; edentulous  CV: regular rate; no JVD Pulmonary: CTA B/L; no W/R/R- good air movement GI: soft, mildly TTP diffusely- couldn't tolerate palpation; also BS very quiet- don't hear tinkling either Psychiatric: appropriate but very flat Neurological: alert-  Skin:   Wounds under pannus- interdry in place-  Musc: LE's- no edema palpated- in feet or ankles- tissue soft, not fluid filled feeling.  Neuro: Alert Motor: Bilateral upper extremities: 4+/5 proximal distal Bilateral lower extremities: Hip flexion, knee extension 4/5, ankle dorsiflexion 5/5  Assessment/Plan: 1. Functional deficits which require 3+ hours per day of interdisciplinary therapy in a comprehensive inpatient rehab setting.  Physiatrist is providing [place] team supervision  and 24 hour management of active medical problems listed below.  Physiatrist and rehab team continue to assess barriers to discharge/monitor patient progress toward functional and medical goals  Care Tool:  Bathing    Body parts bathed by patient: Right arm,Left arm,Chest,Face,Abdomen   Body parts bathed by helper: Right upper leg,Left upper leg,Right lower leg,Left lower leg Body parts n/a: Front perineal area,Buttocks (declined)   Bathing assist Assist Level: Moderate Assistance - Patient 50 - 74%     Upper Body Dressing/Undressing Upper body dressing   What is the patient wearing?: Hospital gown only    Upper body assist Assist Level: Minimal Assistance - Patient > 75%    Lower Body Dressing/Undressing Lower body dressing      What is the patient wearing?: Pants     Lower body assist Assist for lower body dressing: Maximal Assistance - Patient 25 - 49%     Toileting Toileting    Toileting assist Assist for toileting: Dependent - Patient 0%     Transfers Chair/bed transfer  Transfers assist  Chair/bed transfer activity did not occur: Safety/medical concerns  Chair/bed transfer assist level: Minimal Assistance - Patient > 75%     Locomotion Ambulation  Ambulation assist   Ambulation activity did not occur: Safety/medical concerns          Walk 10 feet activity   Assist  Walk 10 feet activity did not occur: Safety/medical concerns        Walk 50 feet activity   Assist Walk 50 feet with 2 turns activity did not occur: Safety/medical concerns         Walk 150 feet activity   Assist Walk 150 feet activity did not occur: Safety/medical concerns         Walk 10 feet on uneven surface  activity   Assist Walk 10 feet on uneven surfaces activity did not occur: Safety/medical concerns         Wheelchair     Assist Will patient use wheelchair at discharge?: Yes (Per PT long term goals) Type of Wheelchair: Manual Wheelchair  activity did not occur: Safety/medical concerns         Wheelchair 50 feet with 2 turns activity    Assist    Wheelchair 50 feet with 2 turns activity did not occur: Safety/medical concerns       Wheelchair 150 feet activity     Assist  Wheelchair 150 feet activity did not occur: Safety/medical concerns       Blood pressure (!) 111/56, pulse 98, temperature 98.6 F (37 C), temperature source Oral, resp. rate 18, height 5\' 3"  (1.6 m), weight (!) 204.6 kg, SpO2 92 %.   Medical Problem List and Plan: 1.Debilitysecondary topanniculitis and SIRS  con't PT and OT as tolerated since feels ill this AM 2. Antithrombotics: -DVT/anticoagulation:Pharmaceutical:Lovenox -antiplatelet therapy: N/A 3.Chronic back pain/Pain Management:Used Mobic and robaxin. Continue tylenol prn  5/24- pain controlled- con't regimen 4. Mood:LCSW to follow for evaluation and support. -antipsychotic agents: N/A 5. Neuropsych: This patientiscapable of making decisions on herown behalf. 6. Skin/Wound Care:Routine pressure relief measures. --Continue Silvadene and Interdry per WOC recommendations.  --is Malnourished with albumin 2.1.  Added collagen and protein to promote wound healing --Add vitamin C, Zinc and multivitamin as supplements.  7. Fluids/Electrolytes/Nutrition:Monitor I/O.  8. Acute on chronic renal failure:   Creatinine 1.47 on 5/20, labs ordered for Monday  5/23- Cr up to 1.86- will encourage lfuids and recheck Wednesday 9. Hyponatremia: Will continue to monitor. Question dilutional   Sodium 133 on 5/20, labs ordered for Monday  5/23- Na up to 135- con't to monitor 10. Anemia of chronic diease: Monitor for signs of bleeding. H/H has been 7-8 range.   Hemoglobin 8.7 on 5/20  5/23- Hb 8.8- con't ot monitor  Continue to monitor 11. COPD/OHS/OSA: Has declinedCPAP use. Encourage pulmonary hygeine. On  Singulair. 12. Schizoaffective bipolar d/o: Continue Wellbutrin with cymbalta and buspar.. 13. RLS: On Requip. 68. Super super obesity: Educate on appropriate diet, self care and weight loss to help promote mobility and overall health.  5/23- weight down some to 204.6 kg- con't hospital diet.  15. Chronic Insomnia: Seroquel started a month ago-caused dizziness and ineffective  5/20- will try trazodone 25 mg q8pm  5/24- sleeping much better- con't regimen  Appears to be improving 16. Renal mass: Was set to see MD at [facility] urology--had toberescheduled. 17. Panniculitis- with open weeping wounds- will con't WOC recs with interdry and wound care- might need some Diflucan to help with yeast overgrowth.   Diflucan x 1 week (5/26) 18. Dizziness/lightheadedness-   Improving 19. ?Edema?  5/23- I don't see actual edema on exam- tissues soft, nonpitting- will wait on any diuretics, esp  because Cr up to 1.86 20. Pt asked for COVID vaccine- will give today  5/24- received vaccine- has nausea- no body aches- will see if from COVID vaccine? 21. Constipation  5/23- will add Senokot 2 tabs daily-  22. Nausea-   5/24- will check KUB and also give compazine prn- has very quiet bowel sounds.     LOS: 5 days A FACE TO FACE EVALUATION WAS PERFORMED  Lilas Diefendorf 08/29/2020, 9:01 AM

## 2020-08-29 NOTE — Progress Notes (Signed)
Occupational Therapy Session Note  Patient Details  Name: Tecora Eustache Rotondo MRN: [id number] Date of Birth: [birth year]  Today's Date: 08/29/2020 OT Missed Time: 41 Minutes Missed Time Reason: Patient unwilling/refused to participate without medical reason;Patient fatigue   Short Term Goals: Week 1:  OT Short Term Goal 1 (Week 1): Pt will complete toilet transfer with no more than 2 assist and LRAD OT Short Term Goal 2 (Week 1): Pt will complete LB dressing at sit<stand level during 1 ADL session with 1 assist OT Short Term Goal 3 (Week 1): Pt will complete 1 grooming task while sitting at the sink to increase OOB tolerance   Skilled Therapeutic Interventions/Progress Updates:    Pt greeted at time of scheduled OT session laying in bed supine resting, stating she felt nauseous. Provided gingerale and ice per her request. Pt requesting to miss OT this am d/t early time and stating she is nauseous. RN aware and providing meds at this time. Will continue to follow, missed 60 mins of OT.   Therapy Documentation Precautions:  Precautions Precautions: Fall Precaution Comments: reports R knee buckling in standing, 2 previous recent falls. 3 in the past 6 months, skin breakdown in periareas Restrictions Weight Bearing Restrictions: No     Therapy/Group: Individual Therapy  Viona Gilmore 08/29/2020, 7:10 AM

## 2020-08-29 NOTE — Progress Notes (Signed)
   08/29/20 1436  Assess: MEWS Score  Temp 98.6 F (37 C)  BP (!) 108/49  Pulse Rate (!) 111  Resp 16  SpO2 92 %  O2 Device Room Air  Assess: MEWS Score  MEWS Temp 0  MEWS Systolic 0  MEWS Pulse 2  MEWS RR 0  MEWS LOC 0  MEWS Score 2  MEWS Score Color Yellow  Assess: if the MEWS score is Yellow or Red  Were vital signs taken at a resting state? Yes  Focused Assessment No change from prior assessment  Early Detection of Sepsis Score *See Row Information* Low  MEWS guidelines implemented *See Row Information* Yes  Treat  MEWS Interventions Escalated (See documentation below)  Take Vital Signs  Increase Vital Sign Frequency  Yellow: Q 2hr X 2 then Q 4hr X 2, if remains yellow, continue Q 4hrs  Escalate  MEWS: Escalate Yellow: discuss with charge nurse/RN and consider discussing with provider and RRT  Notify: Charge Nurse/RN  Name of Charge Nurse/RN Notified [place]  Date Charge Nurse/RN Notified 08/29/20  Time Charge Nurse/RN Notified 1440  Notify: Provider  Provider Name/Title Dr. Dagoberto Ligas  Date Provider Notified 08/29/20  Time Provider Notified 1440  Notification Type Call  Notification Reason Change in status  Provider response No new orders  Date of Provider Response 08/29/20  Time of Provider Response 1440  Document  Patient Outcome Other (Comment) (Monitoring)

## 2020-08-29 NOTE — Progress Notes (Signed)
   08/29/20 1646  Assess: MEWS Score  Temp (!) 100.6 F (38.1 C)  BP (!) 110/59  Pulse Rate (!) 117  Resp (!) 23  SpO2 92 %  Assess: MEWS Score  MEWS Temp 1  MEWS Systolic 0  MEWS Pulse 2  MEWS RR 1  MEWS LOC 0  MEWS Score 4  MEWS Score Color Red  Assess: if the MEWS score is Yellow or Red  Were vital signs taken at a resting state? Yes  Focused Assessment No change from prior assessment  Early Detection of Sepsis Score *See Row Information* High  MEWS guidelines implemented *See Row Information* Yes  Treat  MEWS Interventions Escalated (See documentation below)  Take Vital Signs  Increase Vital Sign Frequency  Red: Q 1hr X 4 then Q 4hr X 4, if remains red, continue Q 4hrs  Escalate  MEWS: Escalate Red: discuss with charge nurse/RN and provider, consider discussing with RRT  Notify: Charge Nurse/RN  Name of Charge Nurse/RN Notified [place]  Date Charge Nurse/RN Notified 08/29/20  Time Charge Nurse/RN Notified [id number]  Notify: Provider  Provider Name/Title Danella Sensing  Date Provider Notified 08/29/20  Time Provider Notified 1700  Notification Type Call  Notification Reason Change in status  Provider response Other (Comment) (Verbal order: push fluids and tylenol prn)  Date of Provider Response 08/29/20  Time of Provider Response 1706  Document  Patient Outcome Other (Comment) (monitoring)

## 2020-08-30 LAB — URINALYSIS, ROUTINE W REFLEX MICROSCOPIC
Bilirubin Urine: NEGATIVE
Glucose, UA: NEGATIVE mg/dL
Ketones, ur: NEGATIVE mg/dL
Nitrite: NEGATIVE
Protein, ur: 100 mg/dL — AB
Specific Gravity, Urine: 1.013 (ref 1.005–1.030)
WBC, UA: >50 WBC/hpf — ABNORMAL HIGH (ref 0–5)
pH: 7 (ref 5.0–8.0)

## 2020-08-30 LAB — BASIC METABOLIC PANEL
Anion gap: 11 (ref 5–15)
BUN: 21 mg/dL — ABNORMAL HIGH (ref 6–20)
CO2: 28 mmol/L (ref 22–32)
Calcium: 9.5 mg/dL (ref 8.9–10.3)
Chloride: 96 mmol/L — ABNORMAL LOW (ref 98–111)
Creatinine, Ser: 2.14 mg/dL — ABNORMAL HIGH (ref 0.44–1.00)
GFR, Estimated: 29 mL/min — ABNORMAL LOW (ref >60)
Glucose, Bld: 112 mg/dL — ABNORMAL HIGH (ref 70–99)
Potassium: 3.8 mmol/L (ref 3.5–5.1)
Sodium: 135 mmol/L (ref 135–145)

## 2020-08-30 LAB — CBC WITH DIFFERENTIAL/PLATELET
Abs Immature Granulocytes: 0.07 10*3/uL (ref 0.00–0.07)
Basophils Absolute: 0.1 10*3/uL (ref 0.0–0.1)
Basophils Relative: 0 %
Eosinophils Absolute: 0.2 10*3/uL (ref 0.0–0.5)
Eosinophils Relative: 1 %
HCT: 35.5 % — ABNORMAL LOW (ref 36.0–46.0)
Hemoglobin: 9.9 g/dL — ABNORMAL LOW (ref 12.0–15.0)
Immature Granulocytes: 1 %
Lymphocytes Relative: 9 %
Lymphs Abs: 1.3 10*3/uL (ref 0.7–4.0)
MCH: 22.4 pg — ABNORMAL LOW (ref 26.0–34.0)
MCHC: 27.9 g/dL — ABNORMAL LOW (ref 30.0–36.0)
MCV: 80.3 fL (ref 80.0–100.0)
Monocytes Absolute: 0.9 10*3/uL (ref 0.1–1.0)
Monocytes Relative: 6 %
Neutro Abs: 12.1 10*3/uL — ABNORMAL HIGH (ref 1.7–7.7)
Neutrophils Relative %: 83 %
Platelets: 488 10*3/uL — ABNORMAL HIGH (ref 150–400)
RBC: 4.42 MIL/uL (ref 3.87–5.11)
RDW: 24.3 % — ABNORMAL HIGH (ref 11.5–15.5)
WBC: 14.7 10*3/uL — ABNORMAL HIGH (ref 4.0–10.5)
nRBC: 0 % (ref 0.0–0.2)

## 2020-08-30 MED ORDER — SULFAMETHOXAZOLE-TRIMETHOPRIM 800-160 MG PO TABS
1.0000 | ORAL_TABLET | Freq: Two times a day (BID) | ORAL | Status: DC
  Administered 2020-08-30 – 2020-09-01 (×4): 1 via ORAL
  Filled 2020-08-30 (×4): qty 1

## 2020-08-30 MED ORDER — SODIUM CHLORIDE 0.9 % IV SOLN: INTRAVENOUS | Status: DC

## 2020-08-30 NOTE — Progress Notes (Signed)
PROGRESS NOTE   Subjective/Complaints:   Pt admits she's not drinking much- water and gingerale, but admits not drinking more than 4 cups/day- also required an in/out cath since not voiding much- PVR/cath volume was 450cc- which is elevated-  Also found out from team that lied about having a motel to go back to and due to her weight cannot go to SNF.  Also pt reports had vomiting x2 overnight-  Red MEWS due to fever of 100.7-  WBC is 14.7- came back this afternoon-started Bactrim DS BID since pt allergic to PCN- has ananaphylaxis with PCN.  Also her Cr up to 2.14 and not sure if not voiding due to post obstructive nephropathy or due to not drinking well- started IVFs- if Cr greatly improved in AM, will wait on Foley, but if not, will start Foley.    ROS:   Pt denies SOB, abd pain, CP,  and vision changes    Objective:   DG Abd 1 View  Result Date: 08/29/2020 CLINICAL DATA:  Nausea, vomiting. EXAM: ABDOMEN - 1 VIEW COMPARISON:  February 09, 2011. FINDINGS: The bowel gas pattern is normal. No radio-opaque calculi or other significant radiographic abnormality are seen. IMPRESSION: Negative. Electronically Signed   By: Marijo Conception M.D.   On: 08/29/2020 11:54   Recent Labs    08/28/20 0627 08/30/20 0908  WBC 8.4 14.7*  HGB 8.8* 9.9*  HCT 31.3* 35.5*  PLT 457* 488*   Recent Labs    08/28/20 0627 08/30/20 0459  NA 135 135  K 3.6 3.8  CL 100 96*  CO2 28 28  GLUCOSE 97 112*  BUN 13 21*  CREATININE 1.83* 2.14*  CALCIUM 9.3 9.5    Intake/Output Summary (Last 24 hours) at 08/30/2020 1904 Last data filed at 08/30/2020 1720 Gross per 24 hour  Intake 1128 ml  Output 450 ml  Net 678 ml        Physical Exam: Vital Signs Blood pressure 118/79, pulse (!) 102, temperature 98.3 F (36.8 C), resp. rate 19, height 5\' 3"  (1.6 m), weight (!) 204.6 kg, SpO2 94 %.      General: awake, alert, appropriate, flat affect;   Describes N/V x2- also appears dry; NAD HENT: conjugate gaze; oropharynx moist; edentulous;  CV: regular rhythm, but borderline tachycardia; no JVD Pulmonary: CTA B/L; no W/R/R- good air movement GI: soft, NT, ND, (+)BS Psychiatric: flat affect; not very interactive Neurological: alert Skin:   Wounds under pannus- interdry in place-  Musc: LE's- no edema palpated- in feet or ankles- tissue soft, not fluid filled feeling.  Motor: Bilateral upper extremities: 4+/5 proximal distal Bilateral lower extremities: Hip flexion, knee extension 4/5, ankle dorsiflexion 5/5  Assessment/Plan: 1. Functional deficits which require 3+ hours per day of interdisciplinary therapy in a comprehensive inpatient rehab setting.  Physiatrist is providing [facility] team supervision and 24 hour management of active medical problems listed below.  Physiatrist and rehab team continue to assess barriers to discharge/monitor patient progress toward functional and medical goals  Care Tool:  Bathing    Body parts bathed by patient: Right arm,Left arm,Chest,Face,Abdomen   Body parts bathed by helper: Right upper leg,Left upper leg,Right  lower leg,Left lower leg Body parts n/a: Front perineal area,Buttocks (declined)   Bathing assist Assist Level: Moderate Assistance - Patient 50 - 74%     Upper Body Dressing/Undressing Upper body dressing   What is the patient wearing?: Hospital gown only    Upper body assist Assist Level: Minimal Assistance - Patient > 75%    Lower Body Dressing/Undressing Lower body dressing      What is the patient wearing?: Pants     Lower body assist Assist for lower body dressing: Maximal Assistance - Patient 25 - 49%     Toileting Toileting    Toileting assist Assist for toileting: Dependent - Patient 0%     Transfers Chair/bed transfer  Transfers assist  Chair/bed transfer activity did not occur: Safety/medical concerns  Chair/bed transfer assist level: Moderate  Assistance - Patient 50 - 74%     Locomotion Ambulation   Ambulation assist   Ambulation activity did not occur: Safety/medical concerns  Assist level: Moderate Assistance - Patient 50 - 74% Assistive device: Other (comment) (bariwalker) Max distance: 4   Walk 10 feet activity   Assist  Walk 10 feet activity did not occur: Safety/medical concerns        Walk 50 feet activity   Assist Walk 50 feet with 2 turns activity did not occur: Safety/medical concerns         Walk 150 feet activity   Assist Walk 150 feet activity did not occur: Safety/medical concerns         Walk 10 feet on uneven surface  activity   Assist Walk 10 feet on uneven surfaces activity did not occur: Safety/medical concerns         Wheelchair     Assist Will patient use wheelchair at discharge?: Yes (Per PT long term goals) Type of Wheelchair: Manual Wheelchair activity did not occur: Safety/medical concerns         Wheelchair 50 feet with 2 turns activity    Assist    Wheelchair 50 feet with 2 turns activity did not occur: Safety/medical concerns       Wheelchair 150 feet activity     Assist  Wheelchair 150 feet activity did not occur: Safety/medical concerns       Blood pressure 118/79, pulse (!) 102, temperature 98.3 F (36.8 C), resp. rate 19, height 5\' 3"  (1.6 m), weight (!) 204.6 kg, SpO2 94 %.   Medical Problem List and Plan: 1.Debilitysecondary topanniculitis and SIRS  con't PT and OT as tolerated since feels ill this AM 2. Antithrombotics: -DVT/anticoagulation:Pharmaceutical:Lovenox -antiplatelet therapy: N/A 3.Chronic back pain/Pain Management:Used Mobic and robaxin. Continue tylenol prn  5/24- pain controlled- con't regimen 4. Mood:LCSW to follow for evaluation and support. -antipsychotic agents: N/A 5. Neuropsych: This patientiscapable of making decisions on herown behalf. 6. Skin/Wound  Care:Routine pressure relief measures. --Continue Silvadene and Interdry per WOC recommendations.  --is Malnourished with albumin 2.1.  Added collagen and protein to promote wound healing --Add vitamin C, Zinc and multivitamin as supplements.  7. Fluids/Electrolytes/Nutrition:Monitor I/O.  8. Acute on chronic renal failure:   Creatinine 1.47 on 5/20, labs ordered for Monday  5/23- Cr up to 1.86- will encourage lfuids and recheck Wednesday  5/25- Cr up to 2.14- will start IVFs 75cc/hr NS and recheck in AM- if not much better, will start Foley since likely post obstructive nephropathy, esp since cath volume after voiding was 450cc-  9. Hyponatremia: Will continue to monitor. Question dilutional   Sodium 133 on 5/20, labs ordered  for Monday  5/23- Na up to 135- con't to monitor 10. Anemia of chronic diease: Monitor for signs of bleeding. H/H has been 7-8 range.   Hemoglobin 8.7 on 5/20  5/23- Hb 8.8- con't ot monitor  Continue to monitor 11. COPD/OHS/OSA: Has declinedCPAP use. Encourage pulmonary hygeine. On Singulair. 12. Schizoaffective bipolar d/o: Continue Wellbutrin with cymbalta and buspar.. 13. RLS: On Requip. 87. Super super obesity: Educate on appropriate diet, self care and weight loss to help promote mobility and overall health.  5/23- weight down some to 204.6 kg- con't hospital diet.  15. Chronic Insomnia: Seroquel started a month ago-caused dizziness and ineffective  5/20- will try trazodone 25 mg q8pm  5/24- sleeping much better- con't regimen  Appears to be improving 16. Renal mass: Was set to see MD at [facility] urology--had toberescheduled. 17. Panniculitis- with open weeping wounds- will con't WOC recs with interdry and wound care- might need some Diflucan to help with yeast overgrowth.   Diflucan x 1 week (5/26) 18. Dizziness/lightheadedness-   Improving 19. ?Edema?  5/23- I don't see actual edema on exam- tissues soft,  nonpitting- will wait on any diuretics, esp because Cr up to 1.86 20. Pt asked for COVID vaccine- will give today  5/24- received vaccine- has nausea- no body aches- will see if from COVID vaccine? 21. Constipation  5/23- will add Senokot 2 tabs daily-  22. Nausea-   5/24- will check KUB and also give compazine prn- has very quiet bowel sounds.   5/25- KUB (-) 23. UTI  5/27- not sure which came first- UTI and retention or retention causing UTI- anyway, starting Bactrim since allergic to PCN- anaphylaxis- also IVFs NS 75cc/hr and will recheck labs in AM     I spent a total of 35 minutes on total care today- >50% on coordination of care- due to social situation, UTI, speaking with nursing multiple times.   LOS: 6 days A FACE TO FACE EVALUATION WAS PERFORMED  Gibril Mastro 08/30/2020, 7:04 PM

## 2020-08-30 NOTE — Progress Notes (Signed)
Attempt to cath pt unsuccessful, with NT brenda at bedside. Will try again after pt has therapy.   Dayna Ramus

## 2020-08-30 NOTE — Progress Notes (Signed)
Physical Therapy Session Note  Patient Details  Name: Heather [name] MRN: [id number] Date of Birth: Nov 13, 1975  Today's Date: 08/30/2020 PT Individual Time: 1100-1200 PT Individual Time Calculation (min): 60 min   Short Term Goals: Week 1:  PT Short Term Goal 1 (Week 1): pt to demonstrate supine<>sit min A consistently PT Short Term Goal 2 (Week 1): pt to demonstrate sit<>stand transfer with LRAD mod A x1 consistently PT Short Term Goal 3 (Week 1): pt to demonstrate SPT with LRAD mod A x1 consistently PT Short Term Goal 4 (Week 1): pt to ambulate with LRAD 10' mod A x1 Week 2:    Week 3:     Skilled Therapeutic Interventions/Progress Updates:    Pain:  Pt reports stomach pain "just a little bit".  Treatment to tolerance.  Rest breaks and repositioning as needed.  Pt initially supine and agreeable to treatment session w/focus on sitting balance, transfers,  Supine to sit w/mod assist.  Static sit on edge of bed w/supervision and stool under feet for support.  Pt sat 6-7 min, requested applejuice, pt states this helps w/nausea.   Sit to stand w/bariwalker w/min to mod assist of 1. stand pivot transfer to wc w/min assist. Noted pt incontinent of stool/soiled bed linens.  NT in to assist.  Pt vomited x 1 in wc. Sit to stand w/mod assist of 2 from wc.  Stood x 4 min, total assist for pericare.   Gait x 68ft to bed w/bariwalker wnd min assist, stand to sit w/min assist on edge of bed. Pt attempt to lie straight back into bed in perpendicular fashion, max cues to safely sequence and prevent sliding from sitting.   Sit to side to supine w/max assist of 2 for LE management. Pt is able to scoot to head of bed w/cues only/bed flat, use of bedrails.  In supine, pt performed: Chest press w/4lb bar 2x25 Overhead raise w/4lb bar 2 x 25 Canoe rows w/4lb bar Ball punches w/4lb bar/beach ball 2x20  At end of session, pt Pt left supine w/rails up x 4, alarm set, bed in lowest position, and needs  in reach.     Therapy Documentation Precautions:  Precautions Precautions: Fall Precaution Comments: reports R knee buckling in standing, 2 previous recent falls. 3 in the past 6 months, skin breakdown in periareas Restrictions Weight Bearing Restrictions: No  Therapy/Group: Individual Therapy  Callie Fielding, [place] 08/30/2020, 4:55 PM

## 2020-08-30 NOTE — Progress Notes (Signed)
Physical Therapy Session Note  Patient Details  Name: Heather [name] MRN: [id number] Date of Birth: 11/02/1975  Today's Date: 08/30/2020 PT Individual Time: 1030-1056 PT Individual Time Calculation (min): 26 min   Short Term Goals: Week 1:  PT Short Term Goal 1 (Week 1): pt to demonstrate supine<>sit min A consistently PT Short Term Goal 2 (Week 1): pt to demonstrate sit<>stand transfer with LRAD mod A x1 consistently PT Short Term Goal 3 (Week 1): pt to demonstrate SPT with LRAD mod A x1 consistently PT Short Term Goal 4 (Week 1): pt to ambulate with LRAD 10' mod A x1  Skilled Therapeutic Interventions/Progress Updates:     Pt greeted supine in bed at start of session, pt on [employer] hospital bed. Pt with emesis bag in hand and reports feeling noxious. Per conversation with RN prior to arrival, pt has been experiencing nausea all morning and has had a few episodes of emesis. Pt agreeable to PT session but not wanting to get OOB despite encouragement. Completed supine there-ex, 1x10 reps each, of ankle pumps, hip abduction, heel slides, glut sets, and alternating unilateral arm raises. After completion, pt agreeable to sitting EOB. Supine<>sit with minA with assist for trunk and LE management. Able to sit EOB with CGA. Pt instructed on LAQ while seated however after a few repetitions, pt reporting increased nausea and reaching for emesis bag, requesting to lay back down. Sit>supine completed with minA for BLE management. Able to reposition herself in bed with cues. She remained supine in bed with HOB elevated, all needs met at end of session.   Therapy Documentation Precautions:  Precautions Precautions: Fall Precaution Comments: reports R knee buckling in standing, 2 previous recent falls. 3 in the past 6 months, skin breakdown in periareas Restrictions Weight Bearing Restrictions: No General:     Therapy/Group: Individual Therapy  Heather [name] 08/30/2020, 7:51 AM

## 2020-08-30 NOTE — Progress Notes (Signed)
Occupational Therapy Session Note  Patient Details  Name: Heather [name] MRN: [id number] Date of Birth: 1976/01/26  Today's Date: 08/30/2020 OT Individual Time: 1552-0802 and 2336-1224 OT Individual Time Calculation (min): 30 min (AM session) and 54 min (PM session) and Today's Date: 08/30/2020 OT Missed Time: 30 Minutes (AM session) Missed Time Reason:  (N/V)   Short Term Goals: Week 1:  OT Short Term Goal 1 (Week 1): Pt will complete toilet transfer with no more than 2 assist and LRAD OT Short Term Goal 2 (Week 1): Pt will complete LB dressing at sit<stand level during 1 ADL session with 1 assist OT Short Term Goal 3 (Week 1): Pt will complete 1 grooming task while sitting at the sink to increase OOB tolerance   Skilled Therapeutic Interventions/Progress Updates:    Session 1: Pt greeted at time of session supine in bed resting stating she had been nauseous all morning, had several episodes of emesis, noted to have green bile still on chin, sheets, etc. Agreeable to wash face and change gown only, did not want to sit EOB for fear of increased N/V. Therapist assist to wash under skin folds. Donned new gown with Min A bed level and mouth rinse with set up. Pt stating at this time she wanted to wash hair even if that did mean getting OOB, but when pt initiated bringing BLEs to EOB, had increased wave of nausea and stating she felt unable to sit EOB/get OOB at this time. Planned to try again in afternoon session to get OOB to wash hair. Call bell in reach all needs met. Missed 30 minutes d/t nausea.   Session 2: Pt greeted at times of session supine in bed resting but agreeable to OT session, still not feeling well but agreeable to try. Pt wanting to wash hair but stating she didn't feel up to getting into wheelchair despite encouragement. Supine > sit EOB with Min/Mod A and pt sitting EOB with feet supported on block for therapist to wash hair at bed level (hair board around neck and basin in  catch water on bed surface) as pt still had bile in her hair from this morning episode of emesis. Partially through, pt stating "I am falling" and quickly laying self in perpendicular position in bed, therapist quickly helping BLEs back on to bed surface and educated on importance of side lying first. Pt rolling L/R with Mod A for therapist to get remaining shampoo out of hair with wash cloths. Pt resting call bell in reach all needs met.   Therapy Documentation Precautions:  Precautions Precautions: Fall Precaution Comments: reports R knee buckling in standing, 2 previous recent falls. 3 in the past 6 months, skin breakdown in periareas Restrictions Weight Bearing Restrictions: No     Therapy/Group: Individual Therapy  Viona Gilmore 08/30/2020, 7:18 AM
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
## 2020-08-30 NOTE — Progress Notes (Signed)
Patient ID: Heather [name], female   DOB: 1975-04-15, 45 y.o.   MRN: [id number]   Housing resources provided to pt.  [place], [place]

## 2020-08-31 LAB — BASIC METABOLIC PANEL
Anion gap: 12 (ref 5–15)
BUN: 24 mg/dL — ABNORMAL HIGH (ref 6–20)
CO2: 26 mmol/L (ref 22–32)
Calcium: 8.9 mg/dL (ref 8.9–10.3)
Chloride: 97 mmol/L — ABNORMAL LOW (ref 98–111)
Creatinine, Ser: 2.05 mg/dL — ABNORMAL HIGH (ref 0.44–1.00)
GFR, Estimated: 30 mL/min — ABNORMAL LOW (ref >60)
Glucose, Bld: 94 mg/dL (ref 70–99)
Potassium: 3.5 mmol/L (ref 3.5–5.1)
Sodium: 135 mmol/L (ref 135–145)

## 2020-08-31 LAB — CBC WITH DIFFERENTIAL/PLATELET
Abs Immature Granulocytes: 0.03 10*3/uL (ref 0.00–0.07)
Basophils Absolute: 0 10*3/uL (ref 0.0–0.1)
Basophils Relative: 0 %
Eosinophils Absolute: 0.6 10*3/uL — ABNORMAL HIGH (ref 0.0–0.5)
Eosinophils Relative: 6 %
HCT: 31.9 % — ABNORMAL LOW (ref 36.0–46.0)
Hemoglobin: 9.1 g/dL — ABNORMAL LOW (ref 12.0–15.0)
Immature Granulocytes: 0 %
Lymphocytes Relative: 20 %
Lymphs Abs: 2 10*3/uL (ref 0.7–4.0)
MCH: 22.6 pg — ABNORMAL LOW (ref 26.0–34.0)
MCHC: 28.5 g/dL — ABNORMAL LOW (ref 30.0–36.0)
MCV: 79.2 fL — ABNORMAL LOW (ref 80.0–100.0)
Monocytes Absolute: 1 10*3/uL (ref 0.1–1.0)
Monocytes Relative: 10 %
Neutro Abs: 6.3 10*3/uL (ref 1.7–7.7)
Neutrophils Relative %: 64 %
Platelets: 469 10*3/uL — ABNORMAL HIGH (ref 150–400)
RBC: 4.03 MIL/uL (ref 3.87–5.11)
RDW: 24.2 % — ABNORMAL HIGH (ref 11.5–15.5)
WBC: 10 10*3/uL (ref 4.0–10.5)
nRBC: 0 % (ref 0.0–0.2)

## 2020-08-31 MED ORDER — ALBUTEROL SULFATE (2.5 MG/3ML) 0.083% IN NEBU: 3.0000 mL | INHALATION_SOLUTION | RESPIRATORY_TRACT | Status: DC | PRN

## 2020-08-31 MED ORDER — BUDESONIDE 0.5 MG/2ML IN SUSP
0.5000 mg | Freq: Two times a day (BID) | RESPIRATORY_TRACT | Status: DC
  Filled 2020-08-31: qty 2

## 2020-08-31 MED ORDER — CHLORHEXIDINE GLUCONATE CLOTH 2 % EX PADS
6.0000 | MEDICATED_PAD | Freq: Every day | CUTANEOUS | Status: DC
  Administered 2020-08-31: 6 via TOPICAL

## 2020-08-31 MED ORDER — SODIUM CHLORIDE 0.9 % IV SOLN: INTRAVENOUS | Status: AC

## 2020-08-31 MED ORDER — TAMSULOSIN HCL 0.4 MG PO CAPS
0.4000 mg | ORAL_CAPSULE | Freq: Every day | ORAL | Status: DC
  Administered 2020-08-31 – 2020-09-13 (×14): 0.4 mg via ORAL
  Filled 2020-08-31 (×14): qty 1

## 2020-08-31 MED ORDER — BUDESONIDE 0.5 MG/2ML IN SUSP
0.5000 mg | Freq: Two times a day (BID) | RESPIRATORY_TRACT | Status: DC
  Administered 2020-09-01 – 2020-09-12 (×20): 0.5 mg via RESPIRATORY_TRACT
  Filled 2020-08-31 (×28): qty 2

## 2020-08-31 NOTE — Progress Notes (Signed)
Physical Therapy Session Note  Patient Details  Name: Heather [name] MRN: [id number] Date of Birth: [birth year]  Today's Date: 08/31/2020 PT Individual Time: 1300-1400 PT Individual Time Calculation (min): 60 min   Short Term Goals: Week 1:  PT Short Term Goal 1 (Week 1): pt to demonstrate supine<>sit min A consistently PT Short Term Goal 2 (Week 1): pt to demonstrate sit<>stand transfer with LRAD mod A x1 consistently PT Short Term Goal 3 (Week 1): pt to demonstrate SPT with LRAD mod A x1 consistently PT Short Term Goal 4 (Week 1): pt to ambulate with LRAD 10' mod A x1 Week 2:     Skilled Therapeutic Interventions/Progress Updates:    pt received in bed and agreeable to therapy. Pt directed in supine>sit mod A with stool for assist and positioning. Pt directed in seated face washing, hair brushing setup A. Pt then reported increased nausea and requested to return to supine, despite best efforts. Pt then min A for sit>supine. Pt directed in supine exercises with 2# ankle weights, heel slides, hip abd/adduction, alternating knee flexion with trunk twist, alternating cross body punches with trunk twist, front raises, and modified small crunches x10 each. Pt left in bed, All needs in reach and in good condition. Call light in hand.    Therapy Documentation Precautions:  Precautions Precautions: Fall Precaution Comments: reports R knee buckling in standing, 2 previous recent falls. 3 in the past 6 months, skin breakdown in periareas Restrictions Weight Bearing Restrictions: No General:   Vital Signs: Therapy Vitals Temp: 98.1 F (36.7 C) Pulse Rate: 100 Resp: 20 BP: 122/82 Patient Position (if appropriate): Lying Oxygen Therapy SpO2: 100 % O2 Device: Room Air Pain:   Mobility:   Locomotion :    Trunk/Postural Assessment :    Balance:   Exercises:   Other Treatments:      Therapy/Group: Individual Therapy  [name] 08/31/2020, 3:56 PM

## 2020-08-31 NOTE — Progress Notes (Signed)
I/O cath not performed. Unmeasured void obtained. Bladder scan revealed 33mL.

## 2020-08-31 NOTE — Progress Notes (Signed)
PROGRESS NOTE   Subjective/Complaints:   Pt reports she's been drinking- also said her mother is paying for motel.   So Mother is still at [employer].   No vomiting since before dinner last night- kept dinner down and no more nausea.   Losing voice- said due to vomiting.  Also has allergies/asthma- wants Flovent restarted- hospital uses Pulmicort- will order- and prn albuterol nebs.  Since Cr only dropped to 2.05 will con't IVFs.  Also, since retained 450cc and Cr hasn't really dropped, will place foley- and start Flomax to help pt void better/empty.     ROS:   Pt denies SOB, abd pain, CP, N/V/C/D, and vision changes    Objective:   No results found. Recent Labs    08/30/20 0908 08/31/20 0501  WBC 14.7* 10.0  HGB 9.9* 9.1*  HCT 35.5* 31.9*  PLT 488* 469*   Recent Labs    08/30/20 0459 08/31/20 0501  NA 135 135  K 3.8 3.5  CL 96* 97*  CO2 28 26  GLUCOSE 112* 94  BUN 21* 24*  CREATININE 2.14* 2.05*  CALCIUM 9.5 8.9    Intake/Output Summary (Last 24 hours) at 08/31/2020 0950 Last data filed at 08/31/2020 1027 Gross per 24 hour  Intake 1248 ml  Output 450 ml  Net 798 ml        Physical Exam: Vital Signs Blood pressure 119/77, pulse 92, temperature 98.7 F (37.1 C), temperature source Oral, resp. rate 19, height 5\' 3"  (1.6 m), weight (!) 204.6 kg, SpO2 95 %.       General: awake, alert, appropriate, BMI 80- laying supine in low air loss bariatric mattress; NAD HENT: conjugate gaze; oropharynx moist; voice in and out- losing voice; edentulous CV: regular rate; no JVD Pulmonary: quiet, hypoventilation?- no W/R/R GI: soft, NT, ND, (+)BS- protuberant/BMI 80 Psychiatric: flat affect- frustrated by Foley plan Neurological: alert Skin:   Wounds under pannus- interdry in place-less weeping  Musc: LE's- no edema palpated- in feet or ankles- tissue soft, not fluid filled feeling.  Motor: Bilateral upper  extremities: 4+/5 proximal distal Bilateral lower extremities: Hip flexion, knee extension 4/5, ankle dorsiflexion 5/5  Assessment/Plan: 1. Functional deficits which require 3+ hours per day of interdisciplinary therapy in a comprehensive inpatient rehab setting.  Physiatrist is providing Heather [name] team supervision and 24 hour management of active medical problems listed below.  Physiatrist and rehab team continue to assess barriers to discharge/monitor patient progress toward functional and medical goals  Care Tool:  Bathing    Body parts bathed by patient: Right arm,Left arm,Chest,Face,Abdomen   Body parts bathed by helper: Right upper leg,Left upper leg,Right lower leg,Left lower leg Body parts n/a: Front perineal area,Buttocks (declined)   Bathing assist Assist Level: Moderate Assistance - Patient 50 - 74%     Upper Body Dressing/Undressing Upper body dressing   What is the patient wearing?: Hospital gown only    Upper body assist Assist Level: Minimal Assistance - Patient > 75%    Lower Body Dressing/Undressing Lower body dressing      What is the patient wearing?: Pants     Lower body assist Assist for lower body dressing: Maximal Assistance -  Patient 25 - 49%     Toileting Toileting    Toileting assist Assist for toileting: Dependent - Patient 0%     Transfers Chair/bed transfer  Transfers assist  Chair/bed transfer activity did not occur: Safety/medical concerns  Chair/bed transfer assist level: Moderate Assistance - Patient 50 - 74%     Locomotion Ambulation   Ambulation assist   Ambulation activity did not occur: Safety/medical concerns  Assist level: Moderate Assistance - Patient 50 - 74% Assistive device: Other (comment) (bariwalker) Max distance: 4   Walk 10 feet activity   Assist  Walk 10 feet activity did not occur: Safety/medical concerns        Walk 50 feet activity   Assist Walk 50 feet with 2 turns activity did not occur:  Safety/medical concerns         Walk 150 feet activity   Assist Walk 150 feet activity did not occur: Safety/medical concerns         Walk 10 feet on uneven surface  activity   Assist Walk 10 feet on uneven surfaces activity did not occur: Safety/medical concerns         Wheelchair     Assist Will patient use wheelchair at discharge?: Yes (Per PT long term goals) Type of Wheelchair: Manual Wheelchair activity did not occur: Safety/medical concerns         Wheelchair 50 feet with 2 turns activity    Assist    Wheelchair 50 feet with 2 turns activity did not occur: Safety/medical concerns       Wheelchair 150 feet activity     Assist  Wheelchair 150 feet activity did not occur: Safety/medical concerns       Blood pressure 119/77, pulse 92, temperature 98.7 F (37.1 C), temperature source Oral, resp. rate 19, height 5\' 3"  (1.6 m), weight (!) 204.6 kg, SpO2 95 %.   Medical Problem List and Plan: 1.Debilitysecondary topanniculitis and SIRS  con't PT and OT- should be able to participate better today- encourage participation 2. Antithrombotics: -DVT/anticoagulation:Pharmaceutical:Lovenox -antiplatelet therapy: N/A 3.Chronic back pain/Pain Management:Used Mobic and robaxin. Continue tylenol prn  5/24- pain controlled- con't regimen 4. Mood:LCSW to follow for evaluation and support. -antipsychotic agents: N/A 5. Neuropsych: This patientiscapable of making decisions on herown behalf. 6. Skin/Wound Care:Routine pressure relief measures. --Continue Silvadene and Interdry per WOC recommendations.  --is Malnourished with albumin 2.1.  Added collagen and protein to promote wound healing --Add vitamin C, Zinc and multivitamin as supplements.  7. Fluids/Electrolytes/Nutrition:Monitor I/O.  8. Acute on chronic renal failure with urinary retention:   Creatinine 1.47 on 5/20,  labs ordered for Monday  5/23- Cr up to 1.86- will encourage lfuids and recheck Wednesday  5/25- Cr up to 2.14- will start IVFs 75cc/hr NS and recheck in AM- if not much better, will start Foley since likely post obstructive nephropathy, esp since cath volume after voiding was 450cc-  5/26- will place foley and add Flomax to get pt to void  9. Hyponatremia: Will continue to monitor. Question dilutional   Sodium 133 on 5/20, labs ordered for Monday  5/23- Na up to 135- con't to monitor 10. Anemia of chronic diease: Monitor for signs of bleeding. H/H has been 7-8 range.   Hemoglobin 8.7 on 5/20  5/23- Hb 8.8- con't ot monitor  Continue to monitor 11. COPD/OHS/OSA: Has declinedCPAP use. Encourage pulmonary hygeine. On Singulair. 12. Schizoaffective bipolar d/o: Continue Wellbutrin with cymbalta and buspar.. 13. RLS: On Requip. 51. Super super obesity: Educate on  appropriate diet, self care and weight loss to help promote mobility and overall health.  5/23- weight down some to 204.6 kg- con't hospital diet.  15. Chronic Insomnia: Seroquel started a month ago-caused dizziness and ineffective  5/20- will try trazodone 25 mg q8pm  5/24- sleeping much better- con't regimen  Appears to be improving 16. Renal mass: Was set to see MD at [facility]--had toberescheduled. 17. Panniculitis- with open weeping wounds- will con't WOC recs with interdry and wound care- might need some Diflucan to help with yeast overgrowth.   Diflucan x 1 week (5/26) 18. Dizziness/lightheadedness-   Improving 19. ?Edema?  5/23- I don't see actual edema on exam- tissues soft, nonpitting- will wait on any diuretics, esp because Cr up to 1.86 20. Pt asked for COVID vaccine- will give today  5/24- received vaccine- has nausea- no body aches- will see if from COVID vaccine? 21. Constipation  5/23- will add Senokot 2 tabs daily-  22. Nausea-   5/24- will check KUB and also give compazine prn- has very quiet bowel  sounds.   5/25- KUB (-) 23. UTI  5/25- not sure which came first- UTI and retention or retention causing UTI- anyway, starting Bactrim since allergic to PCN- anaphylaxis- also IVFs NS 75cc/hr and will recheck labs in AM   5/26- con't IVFs 75cc/hour and push fluids PO- also adding Flomax to get pt to void hopefully- and add foley since retaining- due to body habitus, nursing cannot cath regularly.  24. Asthma  5/26- will satrt Pulmicort middle dose BID and albuterol inhalers prn per pt's home meds 25. Losing voice  5/26- will monitor- not painful- could be from allergies combined with vomiting affecting vocal cords?  I spent a total of 35 minutes on visit- >50% on coordination of care- d/w nurse and pt about foley and reasons for it- also d/w PA.   LOS: 7 days A FACE TO FACE EVALUATION WAS PERFORMED  Heather [name] 08/31/2020, 9:50 AM

## 2020-08-31 NOTE — Progress Notes (Signed)
Occupational Therapy Session Note  Patient Details  Name: Heather [name] MRN: [id number] Date of Birth: [birth year]  Today's Date: 08/31/2020 OT Individual Time: 8341-9622 and 1401-1455 OT Individual Time Calculation (min): 42 min and 54 min   Short Term Goals: Week 1:  OT Short Term Goal 1 (Week 1): Pt will complete toilet transfer with no more than 2 assist and LRAD OT Short Term Goal 2 (Week 1): Pt will complete LB dressing at sit<stand level during 1 ADL session with 1 assist OT Short Term Goal 3 (Week 1): Pt will complete 1 grooming task while sitting at the sink to increase OOB tolerance   Skilled Therapeutic Interventions/Progress Updates:    Session 1: Pt greeted at time of session sitting up in wheelchair agreeable to OT session, stating she was fatigued and had been sitting up in wheelchair for "a while." Agreeable to wash hair at sink as she is already up, set up at sink and therapist assisted with hair washing, pt performing all self repositioning using BUEs and momentum. Pt performing drying of hair and brushing through tangles to improve BUE use. Seated AROM with therapist for demonstration 1x15 for: FWD and backward arm circles, overhead raises. Attempted sit > stand 1 A but unable from wheelchair, 3+ assist for sit > stand from wheelchair with sheet underneath buttocks and Min/CGA for stand pivot with walker to bed. Sit > supine Min A. Hand off to nursing for cath/foley placement.   Session 2: Pt greeted at time of session supine in bed agreeable to OT sessioni with min encouragement, fatigued but willing to try. Supine > sit from flat bed to simulate hotel/home bed with Mod A as more difficulty with flat surface. Sitting EOB, extended time for pt to achieve midline with feet on step up block and hips in neutral, Mod A to achieve walking hips back with reciprocal scooting. BUE there ex initially with 5# dowel per her request, decreased to 2# dowel: bicep curl, chest press,  attempted overhead press but unable, FWD and backward circles all for 1x10-15 reps. Cues for form and pacing. Returned to supine Mod A for LE management. Doffed shorts with Max A, having pt doff as far as she is able and rolling L/R for therapist to take off rest of the way. Call bell in reach all needs met.   Therapy Documentation Precautions:  Precautions Precautions: Fall Precaution Comments: reports R knee buckling in standing, 2 previous recent falls. 3 in the past 6 months, skin breakdown in periareas Restrictions Weight Bearing Restrictions: No     Therapy/Group: Individual Therapy  Viona Gilmore 08/31/2020, 7:29 AM

## 2020-08-31 NOTE — Progress Notes (Signed)
Physical Therapy Session Note  Patient Details  Name: Heather [name] MRN: [id number] Date of Birth: May 16, 1975  Today's Date: 08/31/2020 PT Individual Time: 0900-0943 PT Individual Time Calculation (min): 43 min   Short Term Goals: Week 1:  PT Short Term Goal 1 (Week 1): pt to demonstrate supine<>sit min A consistently PT Short Term Goal 2 (Week 1): pt to demonstrate sit<>stand transfer with LRAD mod A x1 consistently PT Short Term Goal 3 (Week 1): pt to demonstrate SPT with LRAD mod A x1 consistently PT Short Term Goal 4 (Week 1): pt to ambulate with LRAD 10' mod A x1  Skilled Therapeutic Interventions/Progress Updates:    Pt greeted supine in bed at start of session, agreeable to PT tx. Denies pain and repots improved nauesa compared to yesterday. Donned shorts with maxA while supine in bed. Able to roll in bed with modA but difficulty achieving full sidelying due to body habitus (BMI 80). Donned hospital socks with totalA for time management. Completed supine<>sit with modA with use of hospital bed features. Provided 4inch platform under BLE's to improve sitting balance and posture to reduce risk of sliding forward off of [employer] bed. Provided time for upright acclimation (2-3 minutes). Donned t-shirt with minA while seated EOB. Performed sit<>stand with modA to bari RW from EOB - pt able to take a few steps laterally to her w/c with min/modA and bari RW. While seated in w/c, completed 1x10 reps of LAQ and bilateral shoulder flexion as a "warm up." Wheeled to main rehab gym for time management. Completed 1 sit<>stand with modA +2 to bari RW (unable to achieve standing with 1 person assist from w/c level). Worked on standing tolerance where she was able to stand with minA and RW for ~45 seconds prior to fatigue. Returned back to her room where pt was agreeable to remain seated upright in w/c with encouragement - educated on benefits on skin integrity and upright tolerance. Needs in reach at  end of session.  Therapy Documentation Precautions:  Precautions Precautions: Fall Precaution Comments: reports R knee buckling in standing, 2 previous recent falls. 3 in the past 6 months, skin breakdown in periareas Restrictions Weight Bearing Restrictions: No General:    Therapy/Group: Individual Therapy  Alger Simons 08/31/2020, 7:51 AM

## 2020-09-01 LAB — CBC WITH DIFFERENTIAL/PLATELET
Abs Immature Granulocytes: 0.04 10*3/uL (ref 0.00–0.07)
Basophils Absolute: 0 10*3/uL (ref 0.0–0.1)
Basophils Relative: 1 %
Eosinophils Absolute: 0.8 10*3/uL — ABNORMAL HIGH (ref 0.0–0.5)
Eosinophils Relative: 12 %
HCT: 29.4 % — ABNORMAL LOW (ref 36.0–46.0)
Hemoglobin: 8.2 g/dL — ABNORMAL LOW (ref 12.0–15.0)
Immature Granulocytes: 1 %
Lymphocytes Relative: 29 %
Lymphs Abs: 1.8 10*3/uL (ref 0.7–4.0)
MCH: 22.3 pg — ABNORMAL LOW (ref 26.0–34.0)
MCHC: 27.9 g/dL — ABNORMAL LOW (ref 30.0–36.0)
MCV: 79.9 fL — ABNORMAL LOW (ref 80.0–100.0)
Monocytes Absolute: 0.7 10*3/uL (ref 0.1–1.0)
Monocytes Relative: 11 %
Neutro Abs: 3 10*3/uL (ref 1.7–7.7)
Neutrophils Relative %: 46 %
Platelets: 452 10*3/uL — ABNORMAL HIGH (ref 150–400)
RBC: 3.68 MIL/uL — ABNORMAL LOW (ref 3.87–5.11)
RDW: 23.9 % — ABNORMAL HIGH (ref 11.5–15.5)
WBC: 6.4 10*3/uL (ref 4.0–10.5)
nRBC: 0 % (ref 0.0–0.2)

## 2020-09-01 LAB — BASIC METABOLIC PANEL
Anion gap: 8 (ref 5–15)
BUN: 22 mg/dL — ABNORMAL HIGH (ref 6–20)
CO2: 27 mmol/L (ref 22–32)
Calcium: 8.4 mg/dL — ABNORMAL LOW (ref 8.9–10.3)
Chloride: 102 mmol/L (ref 98–111)
Creatinine, Ser: 2.02 mg/dL — ABNORMAL HIGH (ref 0.44–1.00)
GFR, Estimated: 31 mL/min — ABNORMAL LOW (ref >60)
Glucose, Bld: 84 mg/dL (ref 70–99)
Potassium: 3.6 mmol/L (ref 3.5–5.1)
Sodium: 137 mmol/L (ref 135–145)

## 2020-09-01 LAB — URINE CULTURE: Culture: >=100000 — AB

## 2020-09-01 MED ORDER — CHLORHEXIDINE GLUCONATE CLOTH 2 % EX PADS
6.0000 | MEDICATED_PAD | Freq: Two times a day (BID) | CUTANEOUS | Status: DC
Start: 2020-09-01 — End: 2020-09-14
  Administered 2020-09-01 – 2020-09-11 (×13): 6 via TOPICAL

## 2020-09-01 MED ORDER — SODIUM CHLORIDE 0.9 % IV SOLN: INTRAVENOUS | Status: AC

## 2020-09-01 MED ORDER — SODIUM CHLORIDE 0.9 % IV SOLN
510.0000 mg | Freq: Once | INTRAVENOUS | Status: AC
  Administered 2020-09-01: 510 mg via INTRAVENOUS
  Filled 2020-09-01: qty 17

## 2020-09-01 MED ORDER — FOSFOMYCIN TROMETHAMINE 3 G PO PACK
3.0000 g | PACK | Freq: Once | ORAL | Status: AC
  Administered 2020-09-01: 3 g via ORAL
  Filled 2020-09-01: qty 3

## 2020-09-01 NOTE — Progress Notes (Signed)
Patient ID: Heather [name], female   DOB: August 04, 1975, 45 y.o.   MRN: [id number]   SW spoke with pt family. Requesting family conference with physician, social worker and therapy team. Family would like pt mother to be present and informed by physician and therapy team of LOC after d/c. Pt reports to family having stage 3 kidney failure, family reports being unaware. Sw will follow up with physician. Family following up with church on the status of the motel room and if they will allow her to return. SW informed family that pt reports she will be discharging to the motel at discharge.  SW provided pt with resources for housing and shelter on last week. Pt reports she has not has a chance to review the information. SW will cont to follow up.  [place], [place]

## 2020-09-01 NOTE — Progress Notes (Signed)
Physical Therapy Session Note  Patient Details  Name: Heather [name] MRN: [id number] Date of Birth: 21-Apr-1975  Today's Date: 09/01/2020 PT Individual Time: 1100-1150 PT Individual Time Calculation (min): 50 min   Short Term Goals: Week 1:  PT Short Term Goal 1 (Week 1): pt to demonstrate supine<>sit min A consistently PT Short Term Goal 2 (Week 1): pt to demonstrate sit<>stand transfer with LRAD mod A x1 consistently PT Short Term Goal 3 (Week 1): pt to demonstrate SPT with LRAD mod A x1 consistently PT Short Term Goal 4 (Week 1): pt to ambulate with LRAD 10' mod A x1 Week 2:     Skilled Therapeutic Interventions/Progress Updates:    pt received in [facility] and agreeable to therapy. Pt directed in x2 Sit to stand from [facility] at min A to bariatric Rolling walker with majority of assistance for ascending from chair to clear, then pt able to complete rest of stand. Pt stood for 3 mins statically CGA and then needed rest break. On second rep, similar assist, then pt reported she needed to use restroom for BM, BSC setup for this and pt directed in step transfer to [facility] with Rolling walker min A. Pt unable to have BM and reported she would like to rest at EOB instead of returning to [facility], pt to directed in additional Sit to stand from [facility] at min A and max A for clothing management, extra time to turn and then transfer to bedside. Pt then reported nausea sitting up and requested to return to supine, directed in sit>supine min A and pt able to scoot self upright in bed with extra time and multiple attempts with BLE and BUE. Pt then directed in supine bed exercises set to music at pts request to promote improved mood, slightly increased speed of mobility to add improvement with endurance. Pt directed in alternating hip flexion with knee flexion and contralateral arm swings, cross body punches with mild trunk twist, modified crunches to bring head and top of shoulders off bed, and ankle pumps all x15. Improved  mobility and endurance noted with music for activity included. Pt left in bed, All needs in reach and in good condition. Call light in hand.    Therapy Documentation Precautions:  Precautions Precautions: Fall Precaution Comments: reports R knee buckling in standing, 2 previous recent falls. 3 in the past 6 months, skin breakdown in periareas Restrictions Weight Bearing Restrictions: No General:   Vital Signs: Therapy Vitals Temp: 98.4 F (36.9 C) Temp Source: Oral Pulse Rate: (!) 101 Resp: 20 BP: 121/69 Patient Position (if appropriate): Lying Oxygen Therapy SpO2: 98 % O2 Device: Room Air Pain:   Mobility:   Locomotion :    Trunk/Postural Assessment :    Balance:   Exercises:   Other Treatments:      Therapy/Group: Individual Therapy  [name] 09/01/2020, 3:25 PM

## 2020-09-01 NOTE — Progress Notes (Signed)
Physical Therapy Session Note  Patient Details  Name: Heather [name] MRN: [id number] Date of Birth: 05-18-1975  Today's Date: 09/01/2020 PT Individual Time: 1100-1150 PT Individual Time Calculation (min): 50 min   Short Term Goals: Week 1:  PT Short Term Goal 1 (Week 1): pt to demonstrate supine<>sit min A consistently PT Short Term Goal 2 (Week 1): pt to demonstrate sit<>stand transfer with LRAD mod A x1 consistently PT Short Term Goal 3 (Week 1): pt to demonstrate SPT with LRAD mod A x1 consistently PT Short Term Goal 4 (Week 1): pt to ambulate with LRAD 10' mod A x1   Skilled Therapeutic Interventions/Progress Updates:   Pt received supine in bed and agreeable to PT. In bed. Pt instructed in supine therex SAQ, SLR with AAROM, hip abduction with manual resistance, heel slide hip/knee extension with manual resistance. Ankle PF with level 2 tband. All therex completed x10-12 with cues for full ROM and decreased speed of eccentric movement. eft supine in bed with call bell in reach and all needs met.       Therapy Documentation Precautions:  Precautions Precautions: Fall Precaution Comments: reports R knee buckling in standing, 2 previous recent falls. 3 in the past 6 months, skin breakdown in periareas Restrictions Weight Bearing Restrictions: No Vital Signs: Therapy Vitals Temp: 98.1 F (36.7 C) Temp Source: Oral Pulse Rate: 93 Resp: 18 BP: (!) 102/53 Patient Position (if appropriate): Lying Oxygen Therapy SpO2: 95 % O2 Device: Room Air Pain: Pain Assessment Pain Scale: 0-10 Pain Score: 0-No pain    Therapy/Group: Individual Therapy  Lorie Phenix 09/01/2020, 4:37 PM

## 2020-09-01 NOTE — Progress Notes (Signed)
PROGRESS NOTE   Subjective/Complaints:  Pt reports feeling better-  Better than yesterday.   Didn't receive pulmicort last night- got this AM.   Per Pharmacy, UTI resistant to Bactrim- will try Fosfomycin-  Also Cr same- 2.02- will call nephrology-  Since won't improve, even with IVFs and foley- also was retaining a lot base don getting a lot out when foley was placed.    Also her voice going in and out really bothered her- will start Flonase to see if will help. Is already on it- so will continue.   Got foley- per nursing discussion, had a large amount of urine in foley immediately.    ROS:   Pt denies SOB, abd pain, CP, N/V/C/D, and vision changes    Objective:   No results found. Recent Labs    08/31/20 0501 09/01/20 0528  WBC 10.0 6.4  HGB 9.1* 8.2*  HCT 31.9* 29.4*  PLT 469* 452*   Recent Labs    08/31/20 0501 09/01/20 0528  NA 135 137  K 3.5 3.6  CL 97* 102  CO2 26 27  GLUCOSE 94 84  BUN 24* 22*  CREATININE 2.05* 2.02*  CALCIUM 8.9 8.4*    Intake/Output Summary (Last 24 hours) at 09/01/2020 3825 Last data filed at 09/01/2020 0600 Gross per 24 hour  Intake 1360 ml  Output 1225 ml  Net 135 ml        Physical Exam: Vital Signs Blood pressure 97/64, pulse 93, temperature 97.8 F (36.6 C), resp. rate 19, height 5\' 3"  (1.6 m), weight (!) 204.6 kg, SpO2 99 %.       General: awake, alert, appropriate, on bariatric low air loss mattress; BMI still 79.89; NAD HENT: conjugate gaze; oropharynx moist; edentulous CV: regular rate; no JVD Pulmonary- quiet breath sounds- hypoventilation- a little wheezing heard GI: soft, NT, ND, (+)BS- protuberant Psychiatric: appropriate but flat Neurological: alert Skin:   Wounds under pannus- interdry in place-less weeping  Musc: LE's- no edema palpated- in feet or ankles- tissue soft, not fluid filled feeling.  Motor: Bilateral upper extremities: 4+/5  proximal distal Bilateral lower extremities: Hip flexion, knee extension 4/5, ankle dorsiflexion 5/5        Assessment/Plan: 1. Functional deficits which require 3+ hours per day of interdisciplinary therapy in a comprehensive inpatient rehab setting.  Physiatrist is providing [place] team supervision and 24 hour management of active medical problems listed below.  Physiatrist and rehab team continue to assess barriers to discharge/monitor patient progress toward functional and medical goals  Care Tool:  Bathing    Body parts bathed by patient: Right arm,Left arm,Chest,Face,Abdomen   Body parts bathed by helper: Right upper leg,Left upper leg,Right lower leg,Left lower leg Body parts n/a: Front perineal area,Buttocks (declined)   Bathing assist Assist Level: Moderate Assistance - Patient 50 - 74%     Upper Body Dressing/Undressing Upper body dressing   What is the patient wearing?: Hospital gown only    Upper body assist Assist Level: Minimal Assistance - Patient > 75%    Lower Body Dressing/Undressing Lower body dressing      What is the patient wearing?: Pants     Lower body assist  Assist for lower body dressing: Maximal Assistance - Patient 25 - 49%     Toileting Toileting    Toileting assist Assist for toileting: Dependent - Patient 0%     Transfers Chair/bed transfer  Transfers assist  Chair/bed transfer activity did not occur: Safety/medical concerns  Chair/bed transfer assist level: Moderate Assistance - Patient 50 - 74%     Locomotion Ambulation   Ambulation assist   Ambulation activity did not occur: Safety/medical concerns  Assist level: Moderate Assistance - Patient 50 - 74% Assistive device: Other (comment) (bariwalker) Max distance: 4   Walk 10 feet activity   Assist  Walk 10 feet activity did not occur: Safety/medical concerns        Walk 50 feet activity   Assist Walk 50 feet with 2 turns activity did not occur:  Safety/medical concerns         Walk 150 feet activity   Assist Walk 150 feet activity did not occur: Safety/medical concerns         Walk 10 feet on uneven surface  activity   Assist Walk 10 feet on uneven surfaces activity did not occur: Safety/medical concerns         Wheelchair     Assist Will patient use wheelchair at discharge?: Yes (Per PT long term goals) Type of Wheelchair: Manual Wheelchair activity did not occur: Safety/medical concerns         Wheelchair 50 feet with 2 turns activity    Assist    Wheelchair 50 feet with 2 turns activity did not occur: Safety/medical concerns       Wheelchair 150 feet activity     Assist  Wheelchair 150 feet activity did not occur: Safety/medical concerns       Blood pressure 97/64, pulse 93, temperature 97.8 F (36.6 C), resp. rate 19, height 5\' 3"  (1.6 m), weight (!) 204.6 kg, SpO2 99 %.   Medical Problem List and Plan: 1.Debilitysecondary topanniculitis and SIRS  con't PT and OT- should be able to participate better today- encourage participation  -con't PT and OT- and lpw air loss mattress 2. Antithrombotics: -DVT/anticoagulation:Pharmaceutical:Lovenox -antiplatelet therapy: N/A 3.Chronic back pain/Pain Management:Used Mobic and robaxin. Continue tylenol prn  5/27- pain controlled- con't regimen 4. Mood:LCSW to follow for evaluation and support. -antipsychotic agents: N/A 5. Neuropsych: This patientiscapable of making decisions on herown behalf. 6. Skin/Wound Care:Routine pressure relief measures. --Continue Silvadene and Interdry per WOC recommendations.  --is Malnourished with albumin 2.1.  Added collagen and protein to promote wound healing --Add vitamin C, Zinc and multivitamin as supplements.  7. Fluids/Electrolytes/Nutrition:Monitor I/O.  8. Acute on chronic renal failure with urinary retention:    Creatinine 1.47 on 5/20, labs ordered for Monday  5/23- Cr up to 1.86- will encourage lfuids and recheck Wednesday  5/25- Cr up to 2.14- will start IVFs 75cc/hr NS and recheck in AM- if not much better, will start Foley since likely post obstructive nephropathy, esp since cath volume after voiding was 450cc-  5/26- will place foley and add Flomax to get pt to void   5/27- will have nephrology called due to Cr not improving-  9. Hyponatremia: Will continue to monitor. Question dilutional   Sodium 133 on 5/20, labs ordered for Monday  5/23- Na up to 135- con't to monitor 10. Anemia of chronic diease: Monitor for signs of bleeding. H/H has been 7-8 range.   Hemoglobin 8.7 on 5/20  5/23- Hb 8.8- con't ot monitor  Continue to monitor 11. COPD/OHS/OSA: Has  declinedCPAP use. Encourage pulmonary hygeine. On Singulair. 12. Schizoaffective bipolar d/o: Continue Wellbutrin with cymbalta and buspar.. 13. RLS: On Requip. 68. Super super obesity: Educate on appropriate diet, self care and weight loss to help promote mobility and overall health.  5/23- weight down some to 204.6 kg- con't hospital diet.  15. Chronic Insomnia: Seroquel started a month ago-caused dizziness and ineffective  5/20- will try trazodone 25 mg q8pm  5/24- sleeping much better- con't regimen  Appears to be improving 16. Renal mass: Was set to see MD at [facility] urology--had toberescheduled. 17. Panniculitis- with open weeping wounds- will con't WOC recs with interdry and wound care- might need some Diflucan to help with yeast overgrowth.   Diflucan x 1 week (5/26) 18. Dizziness/lightheadedness-   Improving 19. ?Edema?  5/23- I don't see actual edema on exam- tissues soft, nonpitting- will wait on any diuretics, esp because Cr up to 1.86 20. Pt asked for COVID vaccine- will give today  5/24- received vaccine- has nausea- no body aches- will see if from COVID vaccine? 21. Constipation  5/23- will add Senokot 2 tabs daily-   22. Nausea-   5/24- will check KUB and also give compazine prn- has very quiet bowel sounds.   5/25- KUB (-) 23. UTI  5/25- not sure which came first- UTI and retention or retention causing UTI- anyway, starting Bactrim since allergic to PCN- anaphylaxis- also IVFs NS 75cc/hr and will recheck labs in AM   5/26- con't IVFs 75cc/hour and push fluids PO- also adding Flomax to get pt to void hopefully- and add foley since retaining- due to body habitus, nursing cannot cath regularly.  5/27- Bactrim changed to fosfomycin per Pharmacy- was resistant to bactrim- WBC down to 6.4k  24. Asthma  5/26- will satrt Pulmicort middle dose BID and albuterol inhalers prn per pt's home meds  5/27- d/w nursing- need to make sure doesn't miss pukmicort 25. Losing voice  5/26- will monitor- not painful- could be from allergies combined with vomiting affecting vocal cords?  I spent a total of 35 minutes on visit- >50% coordination of care- d/w nursing, pt as well as PA to call nephrology consult.  Also spoke with pharmacy.   LOS: 8 days A FACE TO FACE EVALUATION WAS PERFORMED  Zanya Lindo 09/01/2020, 9:27 AM

## 2020-09-01 NOTE — Progress Notes (Signed)
Occupational Therapy Session Note  Patient Details  Name: Heather [name] MRN: [id number] Date of Birth: May 07, 1975  Today's Date: 09/01/2020 OT Individual Time: 7471-5953 OT Individual Time Calculation (min): 42 min   Short Term Goals: Week 1:  OT Short Term Goal 1 (Week 1): Pt will complete toilet transfer with no more than 2 assist and LRAD OT Short Term Goal 2 (Week 1): Pt will complete LB dressing at sit<stand level during 1 ADL session with 1 assist OT Short Term Goal 3 (Week 1): Pt will complete 1 grooming task while sitting at the sink to increase OOB tolerance  Skilled Therapeutic Interventions/Progress Updates:    Pt greeted in bed with no c/o pain. She stated that she felt fatigued and did not want to transfer to the w/c. Agreeable to sit EOB to work on her sitting balance and also work on UB strengthening in prep for transfers. She transitioned to EOB with bed deflated and Min A. Used the small step for balance support. Provided her with two 3# dumbbells, however pt needed unilateral support to maintain her sitting balance. After ~10 minutes of being unilaterally engaged in exercise, she requested to complete the rest of exercises in the bed. Pt returned to bed with Min A and increased time for repositioning. With HOB elevated, pt able to participate in exercises with simultaneous bimanual involvement, easily fatigued after 10 reps of each exercise, 2 sets completed (bicep curls, cross body curls, forward/backward rows, overhead pumps, forward punches, straight arm raises). Also guided her through gentle cervical and shoulder stretches including forward/backward shoulder rolls, shoulder shrugs, shoulder retraction, and lateral rotation. Instruction emphasis placed on coordinating breath with movement during stretches. At end of session pt remained in bed, all needs within reach and 4 bedrails up of air mattress.   Therapy Documentation Precautions:  Precautions Precautions:  Fall Precaution Comments: reports R knee buckling in standing, 2 previous recent falls. 3 in the past 6 months, skin breakdown in periareas Restrictions Weight Bearing Restrictions: No Vital Signs: Therapy Vitals Temp: 98.4 F (36.9 C) Temp Source: Oral Pulse Rate: (!) 101 Resp: 20 BP: 121/69 Patient Position (if appropriate): Lying Oxygen Therapy SpO2: 98 % O2 Device: Room Air Pain Assessment Pain Scale: 0-10 Pain Score: 0-No pain ADL: ADL Grooming: Setup Where Assessed-Grooming: Edge of bed Upper Body Bathing: Moderate assistance Where Assessed-Upper Body Bathing: Edge of bed Lower Body Bathing: Other (comment) (+2 assist) Where Assessed-Lower Body Bathing: Edge of bed,Bed level Upper Body Dressing: Moderate assistance Where Assessed-Upper Body Dressing: Edge of bed,Bed level Lower Body Dressing: Dependent Where Assessed-Lower Body Dressing: Bed level Toileting: Not assessed Toilet Transfer: Not assessed Tub/Shower Transfer: Not assessed :     Therapy/Group: Individual Therapy  Rob Mciver A Chrisean Kloth 09/01/2020, 3:56 PM

## 2020-09-01 NOTE — Progress Notes (Signed)
Occupational Therapy Session Note  Patient Details  Name: Heather [name] MRN: [id number] Date of Birth: 1976-02-08  Today's Date: 09/01/2020 OT Individual Time: 900-958 58 min   Short Term Goals: Week 1:  OT Short Term Goal 1 (Week 1): Pt will complete toilet transfer with no more than 2 assist and LRAD OT Short Term Goal 2 (Week 1): Pt will complete LB dressing at sit<stand level during 1 ADL session with 1 assist OT Short Term Goal 3 (Week 1): Pt will complete 1 grooming task while sitting at the sink to increase OOB tolerance  Skilled Therapeutic Interventions/Progress Updates:    Pt received supine in bed, agreeable to OT session and no pain reported. Agreeable to bathing/dressing w/c level at sink, but requesting to shower in future session; discussed possibilities of showering in bari shower chair. Sup>sit mod A using bed rails and vc's for technique. Sit<>stand with bari RW min A. Scooting EOB and in w/c using foot stool for improved independence. Stand pivot transfer bed>w/c CGA. Pt completed UB and LB bathing seated at sink using long handled sponge to wash BLEs with overall min A; did not complete peri hygiene or washing buttocks as pt requested to do supine another time. UB dressing min A. LB dressing seated and in stance using reacher to don with mod A, but pt showing improvement in pulling pants over hips in stance with bari RW. Pt required 2-3 rest breaks following sit<>stands during ADLs. Pt remained seated in w/c for OOB tolerance, call bell in reach, and all needs met.    Therapy Documentation Precautions:  Precautions Precautions: Fall Precaution Comments: reports R knee buckling in standing, 2 previous recent falls. 3 in the past 6 months, skin breakdown in periareas Restrictions Weight Bearing Restrictions: No Pain: Pain Assessment Pain Scale: 0-10 Pain Score: 0-No pain   Therapy/Group: Individual Therapy  KEON BENSCOTER 09/01/2020, 3:43 PM

## 2020-09-01 NOTE — Consult Note (Addendum)
[facility] Renal Consultation Note  Requesting MD: Courtney Heys, MD Indication for Consultation:  AKI  Chief complaint: s/p fall   HPI:  Heather [name] is a 45 y.o. female with a history of COPD, recent AKI .  Note admission to [facility] recently (5/4-5/13) for fall and per charting had laid on floor for two days.  Readmitted on 5/13 and was found to be hypotensive.  Had AKI which may have been 2/2 rhabdo with her fall.  She has more recently been in rehab.  Cr trends as below.  Cr 4.18 on 5/5.  Per rehab team she had retention and a foley was placed yesterday.  Cr today unchanged.   She was started on bactrim 5/25 and last dose was this am.  She was on fluids and these were stopped this am.  She states prior to admission was on mobic and had used aleve and motrin as well.   Creat  Date/Time Value Ref Range Status  07/14/2014 02:51 PM 0.70 0.50 - 1.10 mg/dL Final   Creatinine, Ser  Date/Time Value Ref Range Status  09/01/2020 05:28 AM 2.02 (H) 0.44 - 1.00 mg/dL Final  08/31/2020 05:01 AM 2.05 (H) 0.44 - 1.00 mg/dL Final  08/30/2020 04:59 AM 2.14 (H) 0.44 - 1.00 mg/dL Final  08/28/2020 06:27 AM 1.83 (H) 0.44 - 1.00 mg/dL Final  08/25/2020 06:11 AM 1.47 (H) 0.44 - 1.00 mg/dL Final  08/24/2020 04:51 AM 1.45 (H) 0.44 - 1.00 mg/dL Final  08/21/2020 01:47 AM 1.43 (H) 0.44 - 1.00 mg/dL Final  08/19/2020 02:08 AM 1.77 (H) 0.44 - 1.00 mg/dL Final  08/18/2020 08:50 PM 2.00 (H) 0.44 - 1.00 mg/dL Final  08/18/2020 07:36 PM 1.97 (H) 0.44 - 1.00 mg/dL Final  08/16/2020 04:07 AM 1.66 (H) 0.44 - 1.00 mg/dL Final  08/15/2020 04:00 AM 2.14 (H) 0.44 - 1.00 mg/dL Final  08/14/2020 03:31 AM 2.42 (H) 0.44 - 1.00 mg/dL Final  08/13/2020 07:32 AM 2.27 (H) 0.44 - 1.00 mg/dL Final  08/12/2020 04:09 AM 2.85 (H) 0.44 - 1.00 mg/dL Final  08/11/2020 03:21 AM 3.36 (H) 0.44 - 1.00 mg/dL Final  08/10/2020 05:06 AM 4.18 (H) 0.44 - 1.00 mg/dL Final  11/04/2018 03:48 PM 0.80 0.44 - 1.00 mg/dL Final   08/04/2018 12:38 PM 0.74 0.40 - 1.20 mg/dL Final  06/22/2018 02:01 PM 0.86 0.44 - 1.00 mg/dL Final  06/11/2018 01:01 PM 0.57 0.57 - 1.00 mg/dL Final  02/03/2017 02:55 PM 0.62 0.44 - 1.00 mg/dL Final  01/31/2017 11:20 AM 0.47 0.44 - 1.00 mg/dL Final  01/20/2017 11:37 AM 0.66 0.57 - 1.00 mg/dL Final  10/25/2016 10:26 AM 0.40 (L) 0.57 - 1.00 mg/dL Final  08/06/2016 12:10 PM 0.50 (L) 0.57 - 1.00 mg/dL Final  12/09/2014 09:49 AM 0.58 0.44 - 1.00 mg/dL Final  12/07/2014 04:48 AM 0.58 0.44 - 1.00 mg/dL Final  12/06/2014 08:12 PM 0.62 0.44 - 1.00 mg/dL Final  11/12/2014 05:41 AM 0.54 0.44 - 1.00 mg/dL Final  11/11/2014 12:41 PM 0.70 0.44 - 1.00 mg/dL Final  10/29/2014 09:00 PM 0.68 0.44 - 1.00 mg/dL Final  08/01/2014 03:40 PM 0.75 0.50 - 1.10 mg/dL Final  02/18/2013 03:38 PM 0.61 0.50 - 1.10 mg/dL Final  04/21/2012 06:11 PM 0.70 0.50 - 1.10 mg/dL Final  11/04/2011 06:21 PM 0.70 0.50 - 1.10 mg/dL Final  10/23/2011 11:37 AM 0.68 0.50 - 1.10 mg/dL Final  08/29/2011 05:56 PM 0.66 0.50 - 1.10 mg/dL Final  08/21/2011 06:58 PM 0.63 0.50 - 1.10 mg/dL Final  08/06/2011 07:05 PM 0.71 0.50 - 1.10 mg/dL Final  07/31/2011 04:44 PM 0.71 0.50 - 1.10 mg/dL Final  07/29/2011 12:40 PM 0.62 0.50 - 1.10 mg/dL Final  07/28/2011 05:56 PM 0.61 0.50 - 1.10 mg/dL Final  07/20/2011 03:58 AM 0.68 0.50 - 1.10 mg/dL Final  06/29/2011 03:00 PM 0.61 0.50 - 1.10 mg/dL Final  06/28/2011 01:34 PM 0.52 0.50 - 1.10 mg/dL Final  04/12/2011 08:08 PM 0.61 0.50 - 1.10 mg/dL Final  11/24/2010 09:33 AM 0.62 0.50 - 1.10 mg/dL Final  10/24/2010 12:00 AM 0.47 (L) 0.50 - 1.10 mg/dL Final  09/14/2010 09:40 PM 0.51 0.4 - 1.2 mg/dL Final  08/15/2010 09:58 PM 0.50 0.4 - 1.2 mg/dL Final  06/30/2008 09:23 PM 0.63 0.40 - 1.20 mg/dL Final     PMHx:   Past Medical History:  Diagnosis Date  . Allergy   . Amenorrhea   . Anemia    post partum   . Anxiety   . Anxiety   . Arthritis   . Asthma   . Back pain   . Constipation   .  COPD (chronic obstructive pulmonary disease) ([place])   . Delivery with history of C-section   . Depression   . Depression   . Diabetes mellitus without complication [facility])    patient denies but states she has hyperglycemia-diet controlled  . Dysmenorrhea   . Dysrhythmia    DR Johnsie Cancel    . Ectopic pregnancy 2013  . Edema, lower extremity   . Eosinophilic esophagitis    Diagnosed at [facility] 06/16/2013, untreated  . Gallbladder problem   . GERD (gastroesophageal reflux disease)    HEARTBURN   TUMS  . Hard to intubate 11/07/2015  . High cholesterol   . IBS (irritable bowel syndrome)   . Leukocytosis 07/28/2008   Qualifier: Diagnosis of  By: Jonna Munro MD, Roderic Scarce    . Morbid obesity ([place])   . Neuromuscular disorder ([facility])    RESTLESS LEG   . Obesity   . Schizoaffective disorder, bipolar type ([place])   . Sepsis ([place]) 11/11/2014  . Shortness of breath    WITH EXERTION   . Sleep apnea    CPAP- in process of restarting     Past Surgical History:  Procedure Laterality Date  . BIOPSY  08/13/2018   Procedure: BIOPSY;  Surgeon: Yetta Flock, MD;  Location: [facility];  Service: Gastroenterology;;  . CESAREAN SECTION MULTI-GESTATIONAL N/A 02/03/2017   Procedure: CESAREAN SECTION MULTI-GESTATIONAL;  Surgeon: Jonnie Kind, MD;  Location: [facility];  Service: Obstetrics;  Laterality: N/A;  . CHOLECYSTECTOMY    . COLONOSCOPY WITH PROPOFOL N/A 08/13/2018   Procedure: COLONOSCOPY WITH PROPOFOL;  Surgeon: Yetta Flock, MD;  Location: [facility];  Service: Gastroenterology;  Laterality: N/A;  . DENTAL SURGERY    . ESOPHAGOGASTRODUODENOSCOPY  May 2007   Dr. Gala Romney: Normal esophagus, stomach, D1, D2  . ESOPHAGOGASTRODUODENOSCOPY  06/16/2013   Dr. Carlton Adam, eosinophilic esophagitis, reactive gastropathy, no esophageal dilation  . ESOPHAGOGASTRODUODENOSCOPY (EGD) WITH PROPOFOL N/A 08/13/2018   Procedure: ESOPHAGOGASTRODUODENOSCOPY (EGD) WITH PROPOFOL;  Surgeon: Yetta Flock, MD;  Location: [facility];  Service: Gastroenterology;  Laterality: N/A;  . POLYPECTOMY  08/13/2018   Procedure: POLYPECTOMY;  Surgeon: Yetta Flock, MD;  Location: [facility];  Service: Gastroenterology;;  . TONSILLECTOMY    . TOOTH EXTRACTION  10/28/2011   Procedure: DENTAL RESTORATION/EXTRACTIONS;  Surgeon: Gae Bon, DDS;  Location[facility];  Service: Oral Surgery;;  . UPPER GASTROINTESTINAL ENDOSCOPY  Family Hx:  Family History  Problem Relation Age of Onset  . Depression Mother   . Anxiety disorder Mother   . High blood pressure Mother   . Bipolar disorder Mother   . Eating disorder Mother   . Obesity Mother   . Hypertension Sister   . Allergic rhinitis Sister   . Colon polyps Maternal Grandmother        55s  . Diabetes Maternal Grandmother   . Anxiety disorder Maternal Grandmother   . COPD Maternal Grandmother   . Crohn's disease Maternal Aunt   . Cancer Maternal Grandfather        prostate  . HIV/AIDS Father   . Eating disorder Father   . Obesity Father   . Liver disease Neg Hx   . Angioedema Neg Hx   . Eczema Neg Hx   . Immunodeficiency Neg Hx   . Asthma Neg Hx   . Urticaria Neg Hx   . Colon cancer Neg Hx   . Esophageal cancer Neg Hx   . Rectal cancer Neg Hx   . Stomach cancer Neg Hx    Mother has CKD. Pt not sure of etiology. Social History:  reports that she quit smoking about 9 years ago. Her smoking use included cigarettes. She has a 4.00 pack-year smoking history. She has never used smokeless tobacco. She reports that she does not drink alcohol and does not use drugs.  Allergies:  Allergies  Allergen Reactions  . Bee Venom Shortness Of Breath  . Penicillins Anaphylaxis    Has patient had a PCN reaction causing immediate rash, facial/tongue/throat swelling, SOB or lightheadedness with hypotension: No Has patient had a PCN reaction causing severe rash involving mucus membranes or skin necrosis: No Has patient had a PCN reaction that  required hospitalization No Has patient had a PCN reaction occurring within the last 10 years: No If all of the above answers are "NO", then may proceed with Cephalosporin use.'  REACTION: Angioedema  . Penicillin G   . Adhesive [Tape] Rash  . Latex Rash  . Vancomycin Other (See Comments)    Pt can tolerate Vancomycin but did cause Vancomycin Infusion Reaction.  Recommend to pre-medicate with Benadryl before doses administered.      Medications: Prior to Admission medications   Medication Sig Start Date End Date Taking? Authorizing Provider  acetaminophen (TYLENOL) 325 MG tablet Take 650 mg by mouth every 6 (six) hours as needed for mild pain, fever or headache.    [provider]  albuterol (VENTOLIN HFA) 108 (90 Base) MCG/ACT inhaler INHALE 2 PUFFS INTO THE LUNGS EVERY 4 HOURS AS NEEDED FOR WHEEZING OR SHORTNESS OF BREATH Patient taking differently: Inhale 2 puffs into the lungs every 4 (four) hours as needed for wheezing or shortness of breath. 12/02/19   Valentina Shaggy, MD  alum & mag hydroxide-simeth (MAALOX/MYLANTA) 200-200-20 MG/5ML suspension Take 15 mLs by mouth every 6 (six) hours as needed for indigestion or heartburn. 08/24/20   Antonieta Pert, MD  buPROPion (WELLBUTRIN XL) 300 MG 24 hr tablet TAKE 1 TABLET(300 MG) BY MOUTH DAILY 08/18/20   Patrecia Pour, NP  busPIRone (BUSPAR) 10 MG tablet Take 1 tablet (10 mg total) by mouth 2 (two) times daily. 08/24/20   Antonieta Pert, MD  Cholecalciferol (VITAMIN D3) 5000 units CAPS Take 10,000 Units by mouth daily.     [provider]  Cyanocobalamin (VITAMIN B-12) 5000 MCG TBDP Take 5,000 mcg by mouth daily.    [provider]  DULoxetine (CYMBALTA) 30 MG capsule Take 2 capsules (60 mg total) by mouth daily. 07/10/20 10/08/20  Patrecia Pour, NP  fluticasone (FLONASE) 50 MCG/ACT nasal spray Place 1 spray into both nostrils in the morning and at bedtime.    [provider]  fluticasone (FLOVENT HFA) 110  MCG/ACT inhaler Inhale 2 puffs into the lungs 2 (two) times daily. 06/02/19   Valentina Shaggy, MD  folic acid (FOLVITE) 1 MG tablet Take 1 tablet (1 mg total) by mouth daily. 08/24/20 11/22/20  Antonieta Pert, MD  montelukast (SINGULAIR) 10 MG tablet Take 1 tablet (10 mg total) by mouth at bedtime. 08/18/20   Dwyane Dee, MD  nystatin (MYCOSTATIN/NYSTOP) powder Apply 1 application topically 3 (three) times daily. Patient taking differently: Apply 1 application topically 3 (three) times daily as needed (leg/stomach skin fold rashes). 07/05/20   Evelina Dun A, FNP  QUEtiapine (SEROQUEL) 25 MG tablet Take 1 tablet (25 mg total) by mouth at bedtime. 07/10/20 08/09/20  Patrecia Pour, NP  rOPINIRole (REQUIP) 3 MG tablet TAKE 1 TABLET(3 MG) BY MOUTH AT BEDTIME 08/18/20   Patrecia Pour, NP  silver sulfADIAZINE (SILVADENE) 1 % cream Apply to affected area daily 08/18/20 08/18/21  Dwyane Dee, MD  vitamin E 400 UNIT capsule Take 400 Units by mouth daily.    [provider]    I have reviewed the patient's current and reported prior to admission medications.  Labs:  BMP Latest Ref Rng & Units 09/01/2020 08/31/2020 08/30/2020  Glucose 70 - 99 mg/dL 84 94 112(H)  BUN 6 - 20 mg/dL 22(H) 24(H) 21(H)  Creatinine 0.44 - 1.00 mg/dL 2.02(H) 2.05(H) 2.14(H)  BUN/Creat Ratio 9 - 23 - - -  Sodium 135 - 145 mmol/L 137 135 135  Potassium 3.5 - 5.1 mmol/L 3.6 3.5 3.8  Chloride 98 - 111 mmol/L 102 97(L) 96(L)  CO2 22 - 32 mmol/L 27 26 28   Calcium 8.9 - 10.3 mg/dL 8.4(L) 8.9 9.5    Urinalysis    Component Value Date/Time   COLORURINE AMBER (A) 08/30/2020 1250   APPEARANCEUR TURBID (A) 08/30/2020 1250   APPEARANCEUR Cloudy (A) 08/06/2016 1209   LABSPEC 1.013 08/30/2020 1250   PHURINE 7.0 08/30/2020 1250   GLUCOSEU NEGATIVE 08/30/2020 1250   HGBUR SMALL (A) 08/30/2020 1250   HGBUR large 04/19/2008 0952   BILIRUBINUR NEGATIVE 08/30/2020 1250   BILIRUBINUR Negative 08/06/2016 1209   KETONESUR  NEGATIVE 08/30/2020 1250   PROTEINUR 100 (A) 08/30/2020 1250   UROBILINOGEN 0.2 12/06/2014 2049   NITRITE NEGATIVE 08/30/2020 1250   LEUKOCYTESUR LARGE (A) 08/30/2020 1250     ROS:  Pertinent items noted in HPI and remainder of comprehensive ROS otherwise negative.  Physical Exam: Vitals:   09/01/20 1253 09/01/20 1611  BP: 121/69 (!) 102/53  Pulse: (!) 101 93  Resp: 20 18  Temp: 98.4 F (36.9 C) 98.1 F (36.7 C)  SpO2: 98% 95%     General:adult female in bed, obese habitus  HEENT: NCAT Eyes: EOMI sclera anicteric Neck: supple trachea midline Heart: S1S2 no rub Lungs: clear and reduced; normal work of breathing on room air Abdomen: soft/obese habitus/nt/nd Extremities: no pitting edema. No cyanosis or clubbing Skin: no rash on extremities exposed Neuro: frequent chewing motion Psych normal mood and affect  GU foley in place  Assessment/Plan:  # AKI  - In part 2/2 retention.  Recent AKI with rhabdo.  as well with decreased renal reserve.  Note bactrim use.  She  reports use of multiple prior NSAID's prior to admission - NS at 75/hr x 24 hours - stopped the fleet's PRN (please remove from facility orderset or facility formulary if able) - renal panel in am - asked her to please avoid NSAID's now or in the future  # Urinary retention - reported per team  - Would continue foley   # renal mass  - note 8.4 cm mass from the upper pole of the right kidney on renal US 5/9 - She will need urology referral on discharge.  Radiology has recommended follow-up renal MRI with and without contrast. Would defer for now until when renal function improves  # Anemia - microcytic  - iron deficiency  - feraheme 510 mg once on 5/27   Claudia Desanctis 09/01/2020, 6:02 PM

## 2020-09-02 DIAGNOSIS — Z6841 Body Mass Index (BMI) 40.0 and over, adult: Secondary | ICD-10-CM

## 2020-09-02 LAB — RENAL FUNCTION PANEL
Albumin: 2.4 g/dL — ABNORMAL LOW (ref 3.5–5.0)
Anion gap: 10 (ref 5–15)
BUN: 15 mg/dL (ref 6–20)
CO2: 24 mmol/L (ref 22–32)
Calcium: 8.7 mg/dL — ABNORMAL LOW (ref 8.9–10.3)
Chloride: 102 mmol/L (ref 98–111)
Creatinine, Ser: 1.93 mg/dL — ABNORMAL HIGH (ref 0.44–1.00)
GFR, Estimated: 33 mL/min — ABNORMAL LOW (ref >60)
Glucose, Bld: 89 mg/dL (ref 70–99)
Phosphorus: 3.7 mg/dL (ref 2.5–4.6)
Potassium: 3.9 mmol/L (ref 3.5–5.1)
Sodium: 136 mmol/L (ref 135–145)

## 2020-09-02 NOTE — Progress Notes (Signed)
[facility] Progress Note  Name: Heather [name] MRN: [id number] DOB: 07-04-1975  Chief Complaint:  S/p fall here at rehab for deconditioning  Subjective:  she had 1.7 liters UOP over 5/27.  Continues with foley.  See 450 ml in/out cath on 5/25 apparently.   Review of systems:  Denies n/v No chest pain States some shortness of breath but getting breathing tx's - has COPD ----------- Background on consult:  Heather [name] is a 45 y.o. female with a history of COPD, recent AKI .  Note admission to [facility] recently (5/4-5/13) for fall and per charting had laid on floor for two days.  Readmitted on 5/13 and was found to be hypotensive.  Had AKI which may have been 2/2 rhabdo with her fall.  She has more recently been in rehab.  Cr trends as below.  Cr 4.18 on 5/5.  Per rehab team she had retention and a foley was placed yesterday.  Cr today unchanged.   She was started on bactrim 5/25 and last dose was this am.  She was on fluids and these were stopped this am.  She states prior to admission was on mobic and had used aleve and motrin as well.     Intake/Output Summary (Last 24 hours) at 09/02/2020 1552 Last data filed at 09/02/2020 1300 Gross per 24 hour  Intake 1438 ml  Output 1700 ml  Net -262 ml    Vitals:  Vitals:   09/01/20 1940 09/02/20 0613 09/02/20 0813 09/02/20 1200  BP: (!) 102/57 (!) 112/57 (!) 102/50 (!) 102/59  Pulse: 96 83 90 97  Resp: 19 15 19 18   Temp: 98.5 F (36.9 C) 98 F (36.7 C) 97.9 F (36.6 C) 98.7 F (37.1 C)  TempSrc:  Oral Oral Oral  SpO2: 95% 97% 100% 95%  Weight:      Height:         Physical Exam:  General adult female in bed in no acute distress obese habitus HEENT normocephalic atraumatic extraocular movements intact sclera anicteric Neck supple trachea midline Lungs clear to auscultation bilaterally normal work of breathing at rest  Heart S1S2 no rub Abdomen soft nontender nondistended Extremities no edema  Psych  normal mood and affect Neuro alert and oriented x 3 provides hx and follows commands GU foley in place   Medications reviewed   Labs:  BMP Latest Ref Rng & Units 09/02/2020 09/01/2020 08/31/2020  Glucose 70 - 99 mg/dL 89 84 94  BUN 6 - 20 mg/dL 15 22(H) 24(H)  Creatinine 0.44 - 1.00 mg/dL 1.93(H) 2.02(H) 2.05(H)  BUN/Creat Ratio 9 - 23 - - -  Sodium 135 - 145 mmol/L 136 137 135  Potassium 3.5 - 5.1 mmol/L 3.9 3.6 3.5  Chloride 98 - 111 mmol/L 102 102 97(L)  CO2 22 - 32 mmol/L 24 27 26   Calcium 8.9 - 10.3 mg/dL 8.7(L) 8.4(L) 8.9     Assessment/Plan:   # AKI  - In part 2/2 retention.  Recent AKI with rhabdo.  as well with decreased renal reserve.  Note bactrim use recently which may have also contributed.  She reports use of multiple prior NSAID's prior to admission - NS at 75/hr x 24 hours - repeat - renal panel in am - asked her to please avoid NSAID's now or in the future - please avoid bactrim  # Urinary retention - reported per team  - Would continue foley   # renal mass  - note 8.4 cm mass from  the upper pole of the right kidney on renal US 5/9 - She will need urology referral on discharge.  Radiology has recommended follow-up renal MRI with and without contrast. Would defer for now until when renal function improves  # Anemia - microcytic   - iron deficiency  - feraheme 510 mg once on 5/27   Claudia Desanctis, MD 09/02/2020 4:07 PM

## 2020-09-02 NOTE — Progress Notes (Signed)
Physical Therapy Session Note  Patient Details  Name: Heather [name] Rake MRN: [id number] Date of Birth: 07-06-1975  Today's Date: 09/02/2020 PT Individual Time: [id number] PT Individual Time Calculation (min): 70 min   Short Term Goals: Week 1:  PT Short Term Goal 1 (Week 1): pt to demonstrate supine<>sit min A consistently PT Short Term Goal 2 (Week 1): pt to demonstrate sit<>stand transfer with LRAD mod A x1 consistently PT Short Term Goal 3 (Week 1): pt to demonstrate SPT with LRAD mod A x1 consistently PT Short Term Goal 4 (Week 1): pt to ambulate with LRAD 10' mod A x1  Skilled Therapeutic Interventions/Progress Updates:    Patient received supine in bed, agreeable to PT. She denies pain, but reports "discomfort" in abdomen related to not being able to have a bowel movement. Patient agreeable to transfer to [facility] to attempt to have bowel movement. When PT placed catheter bag on bed to move it to the other side- noted it was leaking. RN alerted and she assessed bag. Patient able to come sit edge of bed with MinA and transfer to [facility] via stand pivot with RW and MinA + management of IV pole. Patient continent of bowel and required TotalA for perihygiene in standing. Patient requesting to complete remainder of session at bed level. Bed deflated to allow for easy transition to supine with MinA. Multiple bouts of rolling with CGA and use of bed rails to replace paper chuck pad. PT guiding patient through bed level therex including heel slides, ankle pumps, glute squeezes, SLR, chest press and arm raises. Patient completing 3x12 of each exercise without issue. Patient remaining in bed with 4 rails up, call light within reach.    Therapy Documentation Precautions:  Precautions Precautions: Fall Precaution Comments: reports R knee buckling in standing, 2 previous recent falls. 3 in the past 6 months, skin breakdown in periareas Restrictions Weight Bearing Restrictions: No    Therapy/Group:  Individual Therapy  Karoline Caldwell, PT, DPT, CBIS  09/02/2020, 7:38 AM

## 2020-09-02 NOTE — Progress Notes (Signed)
PROGRESS NOTE   Subjective/Complaints:  Had a reasonable night. Voice better. Denies pain  ROS: Patient denies fever, rash, sore throat, blurred vision, nausea, vomiting, diarrhea, cough, shortness of breath or chest pain, joint or back pain, headache, or mood change.    Objective:   No results found. Recent Labs    08/31/20 0501 09/01/20 0528  WBC 10.0 6.4  HGB 9.1* 8.2*  HCT 31.9* 29.4*  PLT 469* 452*   Recent Labs    09/01/20 0528 09/02/20 0647  NA 137 136  K 3.6 3.9  CL 102 102  CO2 27 24  GLUCOSE 84 89  BUN 22* 15  CREATININE 2.02* 1.93*  CALCIUM 8.4* 8.7*    Intake/Output Summary (Last 24 hours) at 09/02/2020 0852 Last data filed at 09/02/2020 0600 Gross per 24 hour  Intake 248 ml  Output 1700 ml  Net -1452 ml        Physical Exam: Vital Signs Blood pressure (!) 102/50, pulse 90, temperature 97.9 F (36.6 C), temperature source Oral, resp. rate 19, height 5\' 3"  (1.6 m), weight (!) 204.6 kg, SpO2 100 %.       Constitutional: No distress . Vital signs reviewed. HEENT: EOMI, oral membranes moist Neck: supple Cardiovascular: RRR without murmur. No JVD    Respiratory/Chest: CTA Bilaterally without wheezes or rales. Normal effort    GI/Abdomen: BS +, non-tender, non-distended Ext: no clubbing, cyanosis, or edema Psych: pleasant and cooperative Neurological: alert Skin:   Wounds under pannus- interdry in place-less weeping--as below  Musc: no obvious edema Motor: Bilateral upper extremities: 4+/5 proximal distal Bilateral lower extremities: Hip flexion, knee extension 4/5, ankle dorsiflexion 5/5        Assessment/Plan: 1. Functional deficits which require 3+ hours per day of interdisciplinary therapy in a comprehensive inpatient rehab setting.  Physiatrist is providing [facility] team supervision and 24 hour management of active medical problems listed below.  Physiatrist and rehab team  continue to assess barriers to discharge/monitor patient progress toward functional and medical goals  Care Tool:  Bathing    Body parts bathed by patient: Right arm,Left arm,Chest,Abdomen,Right upper leg,Left upper leg,Right lower leg,Face,Left lower leg   Body parts bathed by helper: Right lower leg Body parts n/a: Front perineal area,Buttocks (declined)   Bathing assist Assist Level: Minimal Assistance - Patient > 75%     Upper Body Dressing/Undressing Upper body dressing   What is the patient wearing?: Pull over shirt    Upper body assist Assist Level: Minimal Assistance - Patient > 75%    Lower Body Dressing/Undressing Lower body dressing      What is the patient wearing?: Pants     Lower body assist Assist for lower body dressing: Moderate Assistance - Patient 50 - 74%     Toileting Toileting    Toileting assist Assist for toileting: Dependent - Patient 0%     Transfers Chair/bed transfer  Transfers assist  Chair/bed transfer activity did not occur: Safety/medical concerns  Chair/bed transfer assist level: Minimal Assistance - Patient > 75%     Locomotion Ambulation   Ambulation assist   Ambulation activity did not occur: Safety/medical concerns  Assist level: Moderate Assistance -  Patient 50 - 74% Assistive device: Other (comment) (bariwalker) Max distance: 4   Walk 10 feet activity   Assist  Walk 10 feet activity did not occur: Safety/medical concerns        Walk 50 feet activity   Assist Walk 50 feet with 2 turns activity did not occur: Safety/medical concerns         Walk 150 feet activity   Assist Walk 150 feet activity did not occur: Safety/medical concerns         Walk 10 feet on uneven surface  activity   Assist Walk 10 feet on uneven surfaces activity did not occur: Safety/medical concerns         Wheelchair     Assist Will patient use wheelchair at discharge?: Yes (Per PT long term goals) Type of  Wheelchair: Manual Wheelchair activity did not occur: Safety/medical concerns         Wheelchair 50 feet with 2 turns activity    Assist    Wheelchair 50 feet with 2 turns activity did not occur: Safety/medical concerns       Wheelchair 150 feet activity     Assist  Wheelchair 150 feet activity did not occur: Safety/medical concerns       Blood pressure (!) 102/50, pulse 90, temperature 97.9 F (36.6 C), temperature source Oral, resp. rate 19, height 5\' 3"  (1.6 m), weight (!) 204.6 kg, SpO2 100 %.   Medical Problem List and Plan: 1.Debilitysecondary topanniculitis and SIRS  -Continue CIR therapies including PT, OT   -con't PT and OT- and low air loss mattress 2. Antithrombotics: -DVT/anticoagulation:Pharmaceutical:Lovenox -antiplatelet therapy: N/A 3.Chronic back pain/Pain Management:Used Mobic and robaxin. Continue tylenol prn  5/28- pain controlled- con't regimen 4. Mood:LCSW to follow for evaluation and support. -antipsychotic agents: N/A 5. Neuropsych: This patientiscapable of making decisions on herown behalf. 6. Skin/Wound Care:Routine pressure relief measures. --Continue Silvadene and Interdry per WOC recommendations.  --is Malnourished with albumin 2.1.  Added collagen and protein to promote wound healing --Add vitamin C, Zinc and multivitamin as supplements.  7. Fluids/Electrolytes/Nutrition:Monitor I/O.  8. Acute on chronic renal failure with urinary retention:   Creatinine 1.47 on 5/20, labs ordered for Monday  5/23- Cr up to 1.86- will encourage lfuids and recheck Wednesday  5/25- Cr up to 2.14- will start IVFs 75cc/hr NS and recheck in AM- if not much better, will start Foley since likely post obstructive nephropathy, esp since cath volume after voiding was 450cc-  5/26- will place foley and add Flomax to get pt to void   5/28 BUN/Cr sl improved today. appreciate  nephrology consult for AKI, continue per recs   -continue foley  9. Hyponatremia: Will continue to monitor. Question dilutional   Sodium 133 on 5/20, labs ordered for Monday  5/23- Na up to 135- con't to monitor 10. Anemia of chronic diease: Monitor for signs of bleeding. H/H has been 7-8 range.   Hemoglobin 8.7 on 5/20  5/23- Hb 8.8- con't ot monitor  Continue to monitor 11. COPD/OHS/OSA: Has declinedCPAP use. Encourage pulmonary hygeine. On Singulair. 12. Schizoaffective bipolar d/o: Continue Wellbutrin with cymbalta and buspar.. 13. RLS: On Requip. 52. Super super obesity: Educate on appropriate diet, self care and weight loss to help promote mobility and overall health.  5/23- weight down some to 204.6 kg- con't hospital diet.  15. Chronic Insomnia: Seroquel started a month ago-caused dizziness and ineffective  5/20- will try trazodone 25 mg q8pm  5/24- sleeping much better- con't regimen  Appears  to be improving 16. Renal mass: Was set to see MD at [facility] urology--had toberescheduled. 17. Panniculitis- with open weeping wounds- will con't WOC recs with interdry and wound care- might need some Diflucan to help with yeast overgrowth.   Diflucan x 1 week (5/26) 18. Dizziness/lightheadedness-   Improving 19. ?Edema?  5/23- I don't see actual edema on exam- tissues soft, nonpitting- will wait on any diuretics, esp because Cr up to 1.86 20. Pt asked for COVID vaccine- will give today  5/24- received vaccine- has nausea- no body aches- will see if from COVID vaccine? 21. Constipation  5/23- will add Senokot 2 tabs daily-  22. Nausea-   5/24- will check KUB and also give compazine prn- has very quiet bowel sounds.   5/25- KUB (-) 23. UTI  5/25- not sure which came first- UTI and retention or retention causing UTI- anyway, starting Bactrim since allergic to PCN- anaphylaxis- also IVFs NS 75cc/hr and will recheck labs in AM   5/26- con't IVFs 75cc/hour and push fluids PO- also  adding Flomax to get pt to void hopefully- and add foley since retaining- due to body habitus, nursing cannot cath regularly.  5/27-28- Bactrim changed to fosfomycin per Pharmacy- was resistant to bactrim- WBC down to 6.4k -->continue to observe 24. Asthma  5/26- will satrt Pulmicort middle dose BID and albuterol inhalers prn per pt's home meds  5/27- d/w nursing- need to make sure doesn't miss pukmicort 25. Losing voice  5/26- will monitor- not painful- could be from allergies combined with vomiting affecting vocal cords?   LOS: 9 days A FACE TO FACE EVALUATION WAS PERFORMED  Meredith Staggers 09/02/2020, 8:52 AM

## 2020-09-02 NOTE — Progress Notes (Signed)
Patient refusing CPAP. Hospital unit still in room.

## 2020-09-03 LAB — RENAL FUNCTION PANEL
Albumin: 2.3 g/dL — ABNORMAL LOW (ref 3.5–5.0)
Anion gap: 5 (ref 5–15)
BUN: 14 mg/dL (ref 6–20)
CO2: 26 mmol/L (ref 22–32)
Calcium: 8.7 mg/dL — ABNORMAL LOW (ref 8.9–10.3)
Chloride: 106 mmol/L (ref 98–111)
Creatinine, Ser: 1.65 mg/dL — ABNORMAL HIGH (ref 0.44–1.00)
GFR, Estimated: 40 mL/min — ABNORMAL LOW (ref >60)
Glucose, Bld: 77 mg/dL (ref 70–99)
Phosphorus: 4 mg/dL (ref 2.5–4.6)
Potassium: 4 mmol/L (ref 3.5–5.1)
Sodium: 137 mmol/L (ref 135–145)

## 2020-09-03 NOTE — Progress Notes (Signed)
Therapy stated patient's foley bag was leaking when she moved it. Foley bag was checked for leakage, no leakage was found. Foley bag was empty. Patient stated she did not want foley bag changed.

## 2020-09-03 NOTE — Progress Notes (Signed)
[facility] Progress Note  Name: Heather [name] MRN: [id number] DOB: [birth year]  Chief Complaint:  S/p fall here at rehab for deconditioning  Subjective:  she had 2.5 liters UOP over 5/28.  Continues with foley.  She hopes to get out but states has had to have a foley twice recently  Review of systems:   Denies n/v No chest pain States some shortness of breath and getting breathing tx's - has COPD ----------- Background on consult:  Heather [name] is a 45 y.o. female with a history of COPD, recent AKI .  Note admission to [facility] recently (5/4-5/13) for fall and per charting had laid on floor for two days.  Readmitted on 5/13 and was found to be hypotensive.  Had AKI which may have been 2/2 rhabdo with her fall.  She has more recently been in rehab.  Cr trends as below.  Cr 4.18 on 5/5.  Per rehab team she had retention and a foley was placed yesterday.  Cr today unchanged.   She was started on bactrim 5/25 and last dose was this am.  She was on fluids and these were stopped this am.  She states prior to admission was on mobic and had used aleve and motrin as well.     Intake/Output Summary (Last 24 hours) at 09/03/2020 1503 Last data filed at 09/03/2020 1300 Gross per 24 hour  Intake 3344.14 ml  Output 2500 ml  Net 844.14 ml    Vitals:  Vitals:   09/03/20 0527 09/03/20 0815 09/03/20 0859 09/03/20 1225  BP: 99/77 114/61  92/68  Pulse: 81 93  91  Resp: 18 19  18   Temp: 97.8 F (36.6 C) 98.3 F (36.8 C)  98.4 F (36.9 C)  TempSrc: Oral   Oral  SpO2: 99% 98% 98% 97%  Weight:      Height:         Physical Exam:  General adult female in bed in no acute distress morbidly obese habitus HEENT normocephalic atraumatic extraocular movements intact sclera anicteric Neck supple trachea midline Lungs clear to auscultation bilaterally normal work of breathing at rest  Heart S1S2 no rub Abdomen soft nontender nondistended Extremities no edema  Psych normal  mood and affect Neuro alert and oriented x 3 provides hx and follows commands GU foley in place   Medications reviewed   Labs:  BMP Latest Ref Rng & Units 09/03/2020 09/02/2020 09/01/2020  Glucose 70 - 99 mg/dL 77 89 84  BUN 6 - 20 mg/dL 14 15 22(H)  Creatinine 0.44 - 1.00 mg/dL 1.65(H) 1.93(H) 2.02(H)  BUN/Creat Ratio 9 - 23 - - -  Sodium 135 - 145 mmol/L 137 136 137  Potassium 3.5 - 5.1 mmol/L 4.0 3.9 3.6  Chloride 98 - 111 mmol/L 106 102 102  CO2 22 - 32 mmol/L 26 24 27   Calcium 8.9 - 10.3 mg/dL 8.7(L) 8.7(L) 8.4(L)     Assessment/Plan:   # AKI  - In part 2/2 retention.  Recent AKI with rhabdo.  as well with decreased renal reserve.  Note bactrim use recently which may have also contributed.  She reports use of multiple prior NSAID's prior to admission - asked her to please avoid NSAID's now or in the future - please avoid bactrim  # Urinary retention - reported per team  - Would continue foley and follow-up with urology  # renal mass  - note 8.4 cm mass from the upper pole of the right kidney on renal US  5/9 - She will need urology referral on discharge.  Radiology has recommended follow-up renal MRI with and without contrast  # Anemia - microcytic   - iron deficiency  - feraheme 510 mg once on 5/27   AKI improving.  Nephrology will sign off.  Please contact us with any questions regarding the patient.   Claudia Desanctis, MD 09/03/2020 3:15 PM

## 2020-09-03 NOTE — Progress Notes (Signed)
Occupational Therapy Weekly Progress Note  Patient Details  Name: Heather [name] MRN: [id number] Date of Birth: 1975-11-11  Beginning of progress report period: 08/25/2020 End of progress report period: 09/03/2020  Today's Date: 09/03/2020 OT Individual Time: 0940-1100 OT Individual Time Calculation (min): 80 min   Patient has met 2 of 3 short term goals.   Pt has made slow progress at time of report. She continues to require CGA-Min A for stand pivot transfers using the bari walker, limited functionally by Rt knee instability in standing and also general activity tolerance/decreased standing endurance. Pt had an episode of illness earlier this week which set her back a bit therapy wise. AE education has been ongoing to maximize her functional independence with self care tasks. Pts d/c location is yet to be decided at this time. Continue OT POC.     Patient continues to demonstrate the following deficits: muscle weakness, decreased cardiorespiratoy endurance and decreased sitting balance, decreased standing balance, decreased postural control and decreased balance strategies and therefore will continue to benefit from skilled OT intervention to enhance overall performance with BADL.  Patient progressing toward long term goals..  Continue plan of care.  OT Short Term Goals Week 1:  OT Short Term Goal 1 (Week 1): Pt will complete toilet transfer with no more than 2 assist and LRAD OT Short Term Goal 1 - Progress (Week 1): Met OT Short Term Goal 2 (Week 1): Pt will complete LB dressing at sit<stand level during 1 ADL session with 1 assist OT Short Term Goal 2 - Progress (Week 1): Met OT Short Term Goal 3 (Week 1): Pt will complete 1 grooming task while sitting at the sink to increase OOB tolerance OT Short Term Goal 3 - Progress (Week 1): Progressing toward goal Week 2:  OT Short Term Goal 1 (Week 2): Pt will complete a short distance ambulatory BSC transfer using LRAD and Min A OT Short  Term Goal 2 (Week 2): Pt will complete perihygiene with no more than Min A using adaptive strategies/AE as needed OT Short Term Goal 3 (Week 2): Pt will complete 1 grooming task while standing at the sink using bari walker to increase standing endurance    Skilled Therapeutic Interventions/Progress Updates:    Pt greeted in bed with no c/o pain, hooked up to IV. She requested to engage in bathing. Increased time required for supine<sit, first with bed deflated and then with bed at max firmness per pt request. Nursing already completed medical care/interdry application under belly folds so bathing was completed excluding these areas while sitting EOB, pt unzipping then zipping up her nightgown to meet task demands. OT also washed her hair per pt request, worked on UB strengthening/endurance while combing and blow drying hair with overall Mod A. Pt used the LH sponge to assist with washing feet and also the extra wide sock aide to don gripper socks. She returned to bed to complete perihygiene. Foley catheter found to have holes in it, urine leaking onto bed during rolling for pericare. OT sanitized lower portion of bed and placed towels between pts legs and bed until sanitizer dried. Per charge RN, pt denied having foley bag changed due to fatiguing nature of process. Per RN, foley bag needs to be completely emptied during all mobility due to holes/leakage of bag. Pt found to be incontinent of BM as well at this time. Pt left in care of NT and RN for pericare.   Therapy Documentation Precautions:  Precautions Precautions: Fall  Precaution Comments: reports R knee buckling in standing, 2 previous recent falls. 3 in the past 6 months, skin breakdown in periareas Restrictions Weight Bearing Restrictions: No Vital Signs: Therapy Vitals Temp: 97.8 F (36.6 C) Temp Source: Oral Pulse Rate: 81 Resp: 18 BP: 99/77 Patient Position (if appropriate): Lying Oxygen Therapy SpO2: 99 %   ADL: ADL Grooming:  Setup Where Assessed-Grooming: Edge of bed Upper Body Bathing: Moderate assistance Where Assessed-Upper Body Bathing: Edge of bed Lower Body Bathing: Other (comment) (+2 assist) Where Assessed-Lower Body Bathing: Edge of bed,Bed level Upper Body Dressing: Moderate assistance Where Assessed-Upper Body Dressing: Edge of bed,Bed level Lower Body Dressing: Dependent Where Assessed-Lower Body Dressing: Bed level Toileting: Not assessed Toilet Transfer: Not assessed Tub/Shower Transfer: Not assessed      Therapy/Group: Individual Therapy  Kron Everton A Arian Murley 09/03/2020, 7:17 AM

## 2020-09-03 NOTE — Progress Notes (Signed)
PROGRESS NOTE   Subjective/Complaints:  No problems overnight. Pain controlled at present  ROS: Patient denies fever, rash, sore throat, blurred vision, nausea, vomiting, diarrhea, cough, shortness of breath or chest pain, joint or back pain, headache, or mood change.   Objective:   No results found. Recent Labs    09/01/20 0528  WBC 6.4  HGB 8.2*  HCT 29.4*  PLT 452*   Recent Labs    09/02/20 0647 09/03/20 0529  NA 136 137  K 3.9 4.0  CL 102 106  CO2 24 26  GLUCOSE 89 77  BUN 15 14  CREATININE 1.93* 1.65*  CALCIUM 8.7* 8.7*    Intake/Output Summary (Last 24 hours) at 09/03/2020 0739 Last data filed at 09/03/2020 2229 Gross per 24 hour  Intake 4084.14 ml  Output 1725 ml  Net 2359.14 ml        Physical Exam: Vital Signs Blood pressure 99/77, pulse 81, temperature 97.8 F (36.6 C), temperature source Oral, resp. rate 18, height 5\' 3"  (1.6 m), weight (!) 204.6 kg, SpO2 99 %.       Constitutional: No distress . Vital signs reviewed. HEENT: EOMI, oral membranes moist Neck: supple Cardiovascular: RRR without murmur. No JVD    Respiratory/Chest: CTA Bilaterally without wheezes or rales. Normal effort    GI/Abdomen: BS +, non-tender, non-distended Ext: no clubbing, cyanosis, or edema Psych: pleasant and cooperative Neurological: alert Skin:   Wounds under pannus- interdry in place-still some weeping Musc: no obvious edema Motor: Bilateral upper extremities: 4+/5 proximal distal Bilateral lower extremities: Hip flexion, knee extension 4/5, ankle dorsiflexion 5/5    Assessment/Plan: 1. Functional deficits which require 3+ hours per day of interdisciplinary therapy in a comprehensive inpatient rehab setting.  Physiatrist is providing [place] team supervision and 24 hour management of active medical problems listed below.  Physiatrist and rehab team continue to assess barriers to discharge/monitor  patient progress toward functional and medical goals  Care Tool:  Bathing    Body parts bathed by patient: Right arm,Left arm,Chest,Abdomen,Right upper leg,Left upper leg,Right lower leg,Face,Left lower leg   Body parts bathed by helper: Right lower leg Body parts n/a: Front perineal area,Buttocks (declined)   Bathing assist Assist Level: Minimal Assistance - Patient > 75%     Upper Body Dressing/Undressing Upper body dressing   What is the patient wearing?: Pull over shirt    Upper body assist Assist Level: Minimal Assistance - Patient > 75%    Lower Body Dressing/Undressing Lower body dressing      What is the patient wearing?: Pants     Lower body assist Assist for lower body dressing: Moderate Assistance - Patient 50 - 74%     Toileting Toileting    Toileting assist Assist for toileting: Dependent - Patient 0%     Transfers Chair/bed transfer  Transfers assist  Chair/bed transfer activity did not occur: Safety/medical concerns  Chair/bed transfer assist level: Minimal Assistance - Patient > 75%     Locomotion Ambulation   Ambulation assist   Ambulation activity did not occur: Safety/medical concerns  Assist level: Moderate Assistance - Patient 50 - 74% Assistive device: Other (comment) (bariwalker) Max distance: 4  Walk 10 feet activity   Assist  Walk 10 feet activity did not occur: Safety/medical concerns        Walk 50 feet activity   Assist Walk 50 feet with 2 turns activity did not occur: Safety/medical concerns         Walk 150 feet activity   Assist Walk 150 feet activity did not occur: Safety/medical concerns         Walk 10 feet on uneven surface  activity   Assist Walk 10 feet on uneven surfaces activity did not occur: Safety/medical concerns         Wheelchair     Assist Will patient use wheelchair at discharge?: Yes (Per PT long term goals) Type of Wheelchair: Manual Wheelchair activity did not occur:  Safety/medical concerns         Wheelchair 50 feet with 2 turns activity    Assist    Wheelchair 50 feet with 2 turns activity did not occur: Safety/medical concerns       Wheelchair 150 feet activity     Assist  Wheelchair 150 feet activity did not occur: Safety/medical concerns       Blood pressure 99/77, pulse 81, temperature 97.8 F (36.6 C), temperature source Oral, resp. rate 18, height 5\' 3"  (1.6 m), weight (!) 204.6 kg, SpO2 99 %.   Medical Problem List and Plan: 1.Debilitysecondary topanniculitis and SIRS  -Continue CIR therapies including PT, OT   -con't PT and OT- and low air loss mattress 2. Antithrombotics: -DVT/anticoagulation:Pharmaceutical:Lovenox -antiplatelet therapy: N/A 3.Chronic back pain/Pain Management:Used Mobic and robaxin. Continue tylenol prn  5/29- pain controlled- con't regimen 4. Mood:LCSW to follow for evaluation and support. -antipsychotic agents: N/A 5. Neuropsych: This patientiscapable of making decisions on herown behalf. 6. Skin/Wound Care:Routine pressure relief measures. --Continue Silvadene and Interdry per WOC recommendations.  --is Malnourished with albumin 2.1.  Added collagen and protein to promote wound healing --Add vitamin C, Zinc and multivitamin as supplements.  7. Fluids/Electrolytes/Nutrition:Monitor I/O.  8. Acute on chronic renal failure with urinary retention:   Creatinine 1.47 on 5/20, labs ordered for Monday  5/23- Cr up to 1.86- will encourage lfuids and recheck Wednesday  5/25- Cr up to 2.14- will start IVFs 75cc/hr NS and recheck in AM- if not much better, will start Foley since likely post obstructive nephropathy, esp since cath volume after voiding was 450cc-  5/26- will place foley and add Flomax to get pt to void   5/29 BUN/Cr demonstrating improvement. appreciate nephrology consult for AKI, continue per recs   -continue  foley  9. Hyponatremia: Will continue to monitor. Question dilutional   Sodium 133 on 5/20, labs ordered for Monday    Na up to 137- con't to monitor 10. Anemia of chronic diease: Monitor for signs of bleeding. H/H has been 7-8 range.   Hemoglobin 8.7 on 5/20  5/27- Hb 8.2- con't ot monitor  Continue to monitor 11. COPD/OHS/OSA: Has declinedCPAP use. Encourage pulmonary hygeine. On Singulair. 12. Schizoaffective bipolar d/o: Continue Wellbutrin with cymbalta and buspar.. 13. RLS: On Requip. 27. Super super obesity: Educate on appropriate diet, self care and weight loss to help promote mobility and overall health.  5/23- weight down some to 204.6 kg- con't hospital diet.  15. Chronic Insomnia: Seroquel started a month ago-caused dizziness and ineffective  5/20- will try trazodone 25 mg q8pm  5/24- sleeping much better- con't regimen  Appears to be improving 16. Renal mass: Was set to see MD at [facility] urology--had toberescheduled.  17. Panniculitis- with open weeping wounds- will con't WOC recs with interdry and wound care- might need some Diflucan to help with yeast overgrowth.   Diflucan x 1 week (5/26) 18. Dizziness/lightheadedness-   Improving 19. ?Edema?  5/23- I don't see actual edema on exam- tissues soft, nonpitting- will wait on any diuretics, esp because Cr up to 1.86 20. Pt asked for COVID vaccine- will give today  5/24- received vaccine- has nausea- no body aches- will see if from COVID vaccine? 21. Constipation  5/23- will add Senokot 2 tabs daily-  22. Nausea-   5/24- will check KUB and also give compazine prn- has very quiet bowel sounds.   5/25- KUB (-) 23. UTI  5/25- not sure which came first- UTI and retention or retention causing UTI- anyway, starting Bactrim since allergic to PCN- anaphylaxis- also IVFs NS 75cc/hr and will recheck labs in AM   5/26- con't IVFs 75cc/hour and push fluids PO- also adding Flomax to get pt to void hopefully- and add foley since  retaining- due to body habitus, nursing cannot cath regularly.  5/27-29- Bactrim changed to fosfomycin per Pharmacy- was resistant to bactrim- WBC down to 6.4k --avoid bactrim per nephrology recs 24. Asthma  5/26- will satrt Pulmicort middle dose BID and albuterol inhalers prn per pt's home meds  5/27- d/w nursing- need to make sure doesn't miss pukmicort 25. Losing voice  5/26- will monitor- not painful- could be from allergies combined with vomiting affecting vocal cords?   LOS: 10 days A FACE TO FACE EVALUATION WAS PERFORMED  Meredith Staggers 09/03/2020, 7:39 AM

## 2020-09-03 NOTE — Progress Notes (Signed)
RT offered to place pt on CPAP dream station for the night and pt declined at this time. Pt has unit available in room. RT will continue to monitor.

## 2020-09-04 LAB — CBC
HCT: 30 % — ABNORMAL LOW (ref 36.0–46.0)
Hemoglobin: 8.5 g/dL — ABNORMAL LOW (ref 12.0–15.0)
MCH: 23 pg — ABNORMAL LOW (ref 26.0–34.0)
MCHC: 28.3 g/dL — ABNORMAL LOW (ref 30.0–36.0)
MCV: 81.3 fL (ref 80.0–100.0)
Platelets: 570 10*3/uL — ABNORMAL HIGH (ref 150–400)
RBC: 3.69 MIL/uL — ABNORMAL LOW (ref 3.87–5.11)
RDW: 24.1 % — ABNORMAL HIGH (ref 11.5–15.5)
WBC: 9.1 10*3/uL (ref 4.0–10.5)
nRBC: 0 % (ref 0.0–0.2)

## 2020-09-04 LAB — BASIC METABOLIC PANEL
Anion gap: 7 (ref 5–15)
BUN: 12 mg/dL (ref 6–20)
CO2: 23 mmol/L (ref 22–32)
Calcium: 9.1 mg/dL (ref 8.9–10.3)
Chloride: 107 mmol/L (ref 98–111)
Creatinine, Ser: 1.51 mg/dL — ABNORMAL HIGH (ref 0.44–1.00)
GFR, Estimated: 44 mL/min — ABNORMAL LOW (ref >60)
Glucose, Bld: 85 mg/dL (ref 70–99)
Potassium: 4.3 mmol/L (ref 3.5–5.1)
Sodium: 137 mmol/L (ref 135–145)

## 2020-09-04 NOTE — Progress Notes (Signed)
Physical Therapy Session Note  Patient Details  Name: Heather [name] MRN: [id number] Date of Birth: 04/10/1975  Today's Date: 09/04/2020 PT Individual Time:0900  - 1000, 60 min     Short Term Goals: Week 1:  PT Short Term Goal 1 (Week 1): pt to demonstrate supine<>sit min A consistently PT Short Term Goal 2 (Week 1): pt to demonstrate sit<>stand transfer with LRAD mod A x1 consistently PT Short Term Goal 3 (Week 1): pt to demonstrate SPT with LRAD mod A x1 consistently PT Short Term Goal 4 (Week 1): pt to ambulate with LRAD 10' mod A x1 Week 2:     Skilled Therapeutic Interventions/Progress Updates:  Pt resting in bed.  She denied pain but stated that she felt she would need to have BM soon.  Therapeutic exercise performed with LE to increase strength for functional mobility 10 x 1 supine- bil hip external rotation, bil bridging, bil quad sets.  Seated 10 x 1 R/L alternating long arc quad knee extension, 10 x 2 ankle pumps, bil hip adductor squeezes.   Supine> sit with min assist, using bed features.  Stand pivot transfer from elevated bed with min assist, RW to [facility].  Pt continent of bowel.  Peri care +2 for cleaning as well as guarding R knee , in standing with RW.  Gait training on level tile x 25' CGA for R knee, iwht RW, and wc follow. Pt reported that she could have walked further if she had been wearing shoes, not non- slip socks. No R knee buckling noted and pt denied feeling that this was an issue.  PT adjusted length of bil leg rests, and provided 2 pillows behind back to decrease pt's sacral sitting. At end of session, pt seated in wc with R foot on footstool per request, and needs at hand.  PT asked Hassan Rowan, NT to check on seat pad alarm for pt.      Therapy Documentation Precautions:  Precautions Precautions: Fall Precaution Comments: reports R knee buckling in standing, 2 previous recent falls. 3 in the past 6 months, skin breakdown in periareas Restrictions Weight  Bearing Restrictions: No      Therapy/Group: Individual Therapy  Caedon Bond 09/04/2020, 7:53 AM

## 2020-09-04 NOTE — Progress Notes (Signed)
Physical Therapy Session Note  Patient Details  Name: Heather [name] MRN: [id number] Date of Birth: Jul 04, 1975  Today's Date: 09/04/2020 PT Individual Time: 3403-7096 PT Individual Time Calculation (min): 53 min   Short Term Goals: Week 1:  PT Short Term Goal 1 (Week 1): pt to demonstrate supine<>sit min A consistently PT Short Term Goal 2 (Week 1): pt to demonstrate sit<>stand transfer with LRAD mod A x1 consistently PT Short Term Goal 3 (Week 1): pt to demonstrate SPT with LRAD mod A x1 consistently PT Short Term Goal 4 (Week 1): pt to ambulate with LRAD 10' mod A x1  Skilled Therapeutic Interventions/Progress Updates:    Pt received supine in bed and agreeable to therapy session. Supine>sitting R EOB, HOB slightly elevated and heavily relying on bedrails with min assist for trunk upright and HHA to scoot hips to EOB. Sit>stand EOB>bari-RW with CGA and L stand pivot to bari-w/c with CGA (pt uses green bari-w/c because it is higher than our w/c making it easier for her to come to stand). Transported to/from gym in w/c for time management and energy conservation. Pt reports not liking the green bari-RW therefore provided the black bari-RW but then pt states she didn't feel comfortable trying to stand with the black bari-RW despite encouragement - recommended pt have family bring in her RW to use for improved carryover to home environment if possible. Sit>stand bari-w/c>bari-RW with CGA and pt demoing delayed trunk/hip extension to achieve upright with heavy reliance on BUE support. Gait training 66ft and 4ft (seated break between) using bari-RW with CGA from 1 and +2 w/c follow (but not needed as pt able to turn and sit safely on mat table at end of each walk) - demos adequate gait speed with reciprocal stepping pattern and increased R/L weight shift but otherwise no significant gait impairments noted. Repeated sit<>stands EOM<>bari-RW x5 reps and x3 reps (seated break between sets) with CGA -  cuing for increased glute activation coming to stand to decrease the delay in achieving upright posture. Stand pivot to w/c using bari-RW with CGA. Transported back to room and RN requesting pt return to bed for removal of catheter. Short distance ~2ft ambulatory transfer w/c>EOB using bari-RW with CGA. Sit>supine with mod assist for B LE management into the bed. Pt left supine in bed with needs in reach and RN present to assume care of patient.  Therapy Documentation Precautions:  Precautions Precautions: Fall Precaution Comments: reports R knee buckling in standing, 2 previous recent falls. 3 in the past 6 months, skin breakdown in periareas Restrictions Weight Bearing Restrictions: No  Pain: No reports of pain throughout session.    Therapy/Group: Individual Therapy  Tawana Scale , PT, DPT, CSRS  09/04/2020, 12:30 PM

## 2020-09-04 NOTE — Progress Notes (Signed)
Patient refused CPAP tonight 

## 2020-09-04 NOTE — Progress Notes (Signed)
Occupational Therapy Session Note  Patient Details  Name: Heather [name] MRN: [id number] Date of Birth: 06-02-1975  Today's Date: 09/04/2020 OT Individual Time: 1102-1157 OT Individual Time Calculation (min): 55 min    Short Term Goals: Week 1:  OT Short Term Goal 1 (Week 1): Pt will complete toilet transfer with no more than 2 assist and LRAD OT Short Term Goal 1 - Progress (Week 1): Met OT Short Term Goal 2 (Week 1): Pt will complete LB dressing at sit<stand level during 1 ADL session with 1 assist OT Short Term Goal 2 - Progress (Week 1): Met OT Short Term Goal 3 (Week 1): Pt will complete 1 grooming task while sitting at the sink to increase OOB tolerance OT Short Term Goal 3 - Progress (Week 1): Progressing toward goal Week 2:  OT Short Term Goal 1 (Week 2): Pt will complete a short distance ambulatory BSC transfer using LRAD and Min A OT Short Term Goal 2 (Week 2): Pt will complete perihygiene with no more than Min A using adaptive strategies/AE as needed OT Short Term Goal 3 (Week 2): Pt will complete 1 grooming task while standing at the sink using bari walker to increase standing endurance   Skilled Therapeutic Interventions/Progress Updates:    Pt greeted at time of session sitting up in wheelchair from previous therapy session this am, agreeable to OT session with Min encouragement, saying she wanted to get back to bed. Pt transported to hall and attempted to have pt self propel w/c but d/t body habitus unable to do so. Therapist resuming, transported > gym dependent and performed SCIFIT on level 3 for 6 minutes total 1 rest break at half way point for BUE strengthening and cardiorespiratory endurance. 2 rounds of standing BITS activity with sequential numbers 1-10 with unilateral support on AD, standing approx 1 minute. Transported to room, asking about a shower and relayed with the pt that would be unsafe at this time d/t small tight showers. Stand pivot wheelchair > bed CGA  and Min/Mod for sit > supine. Call bell in reach all needs met.   Therapy Documentation Precautions:  Precautions Precautions: Fall Precaution Comments: reports R knee buckling in standing, 2 previous recent falls. 3 in the past 6 months, skin breakdown in periareas Restrictions Weight Bearing Restrictions: No    Therapy/Group: Individual Therapy  Viona Gilmore 09/04/2020, 7:24 AM

## 2020-09-04 NOTE — Progress Notes (Signed)
PROGRESS NOTE   Subjective/Complaints:  No nausea- no signs of UTI right now- done with fosfomycin.  Renal signed off yesterday- Cr down to 1.51- pt ants IV out and Foley out.  Refusing CPAP again- even though she told me she would use last week.     ROS:  Pt denies SOB, abd pain, CP, N/V/C/D, and vision changes  Objective:   No results found. Recent Labs    09/04/20 0503  WBC 9.1  HGB 8.5*  HCT 30.0*  PLT 570*   Recent Labs    09/03/20 0529 09/04/20 0503  NA 137 137  K 4.0 4.3  CL 106 107  CO2 26 23  GLUCOSE 77 85  BUN 14 12  CREATININE 1.65* 1.51*  CALCIUM 8.7* 9.1    Intake/Output Summary (Last 24 hours) at 09/04/2020 1004 Last data filed at 09/04/2020 0715 Gross per 24 hour  Intake 824 ml  Output 1300 ml  Net -476 ml        Physical Exam: Vital Signs Blood pressure 115/65, pulse 80, temperature 97.8 F (36.6 C), resp. rate 17, height 5\' 3"  (1.6 m), weight (!) 204.6 kg, SpO2 99 %.        General: awake, alert, appropriate, laying in low loss bariatric air bed NAD HENT: conjugate gaze; oropharynx moist CV: regular rate; no JVD Pulmonary: CTA B/L; no W/R/R- good air movement GI: soft, NT, ND, (+)BS Psychiatric: appropriate- interactive Neurological: Alert Skin:   Wounds under pannus- interdry in place-still some weeping- no change Musc: no obvious edema Motor: Bilateral upper extremities: 4+/5 proximal distal Bilateral lower extremities: Hip flexion, knee extension 4/5, ankle dorsiflexion 5/5    Assessment/Plan: 1. Functional deficits which require 3+ hours per day of interdisciplinary therapy in a comprehensive inpatient rehab setting.  Physiatrist is providing [place] team supervision and 24 hour management of active medical problems listed below.  Physiatrist and rehab team continue to assess barriers to discharge/monitor patient progress toward functional and medical goals  Care  Tool:  Bathing    Body parts bathed by patient: Right arm,Left arm,Chest,Abdomen,Right upper leg,Left upper leg,Right lower leg,Face,Left lower leg   Body parts bathed by helper: Right lower leg Body parts n/a: Front perineal area,Buttocks (declined)   Bathing assist Assist Level: Minimal Assistance - Patient > 75%     Upper Body Dressing/Undressing Upper body dressing   What is the patient wearing?: Pull over shirt    Upper body assist Assist Level: Minimal Assistance - Patient > 75%    Lower Body Dressing/Undressing Lower body dressing      What is the patient wearing?: Pants     Lower body assist Assist for lower body dressing: Moderate Assistance - Patient 50 - 74%     Toileting Toileting    Toileting assist Assist for toileting: Dependent - Patient 0%     Transfers Chair/bed transfer  Transfers assist  Chair/bed transfer activity did not occur: Safety/medical concerns  Chair/bed transfer assist level: Minimal Assistance - Patient > 75%     Locomotion Ambulation   Ambulation assist   Ambulation activity did not occur: Safety/medical concerns  Assist level: Moderate Assistance - Patient 50 - 74% Assistive  device: Other (comment) (bariwalker) Max distance: 4   Walk 10 feet activity   Assist  Walk 10 feet activity did not occur: Safety/medical concerns        Walk 50 feet activity   Assist Walk 50 feet with 2 turns activity did not occur: Safety/medical concerns         Walk 150 feet activity   Assist Walk 150 feet activity did not occur: Safety/medical concerns         Walk 10 feet on uneven surface  activity   Assist Walk 10 feet on uneven surfaces activity did not occur: Safety/medical concerns         Wheelchair     Assist Will patient use wheelchair at discharge?: Yes (Per PT long term goals) Type of Wheelchair: Manual Wheelchair activity did not occur: Safety/medical concerns         Wheelchair 50 feet  with 2 turns activity    Assist    Wheelchair 50 feet with 2 turns activity did not occur: Safety/medical concerns       Wheelchair 150 feet activity     Assist  Wheelchair 150 feet activity did not occur: Safety/medical concerns       Blood pressure 115/65, pulse 80, temperature 97.8 F (36.6 C), resp. rate 17, height 5\' 3"  (1.6 m), weight (!) 204.6 kg, SpO2 99 %.   Medical Problem List and Plan: 1.Debilitysecondary topanniculitis and SIRS  -con't PT and OT and low air loss mattress 2. Antithrombotics: -DVT/anticoagulation:Pharmaceutical:Lovenox -antiplatelet therapy: N/A 3.Chronic back pain/Pain Management:Used Mobic and robaxin. Continue tylenol prn  5/30- denies pain- con't tylenol prn- NO NSAIDs per renal 4. Mood:LCSW to follow for evaluation and support. -antipsychotic agents: N/A 5. Neuropsych: This patientiscapable of making decisions on herown behalf. 6. Skin/Wound Care:Routine pressure relief measures. --Continue Silvadene and Interdry per WOC recommendations.  --is Malnourished with albumin 2.1.  Added collagen and protein to promote wound healing --Add vitamin C, Zinc and multivitamin as supplements.  7. Fluids/Electrolytes/Nutrition:Monitor I/O.  8. Acute on chronic renal failure with urinary retention:   Creatinine 1.47 on 5/20, labs ordered for Monday  5/23- Cr up to 1.86- will encourage lfuids and recheck Wednesday  5/25- Cr up to 2.14- will start IVFs 75cc/hr NS and recheck in AM- if not much better, will start Foley since likely post obstructive nephropathy, esp since cath volume after voiding was 450cc-  5/26- will place foley and add Flomax to get pt to void   5/29 BUN/Cr demonstrating improvement. appreciate nephrology consult for AKI, continue per recs   -continue foley   5/30- will d/c foley but recheck labs Thursday to see if retaining/Cr up?- d/c IVFs Cr down to  1.51 9. Hyponatremia: Will continue to monitor. Question dilutional   Sodium 133 on 5/20, labs ordered for Monday    Na up to 137- con't to monitor 10. Anemia of chronic diease: Monitor for signs of bleeding. H/H has been 7-8 range.   Hemoglobin 8.7 on 5/20  5/27- Hb 8.2- con't ot monitor  Continue to monitor 11. COPD/OHS/OSA: Has declinedCPAP use. Encourage pulmonary hygeine. On Singulair. 12. Schizoaffective bipolar d/o: Continue Wellbutrin with cymbalta and buspar.. 13. RLS: On Requip. 86. Super super obesity: Educate on appropriate diet, self care and weight loss to help promote mobility and overall health.  5/23- weight down some to 204.6 kg- con't hospital diet.  15. Chronic Insomnia: Seroquel started a month ago-caused dizziness and ineffective  5/20- will try trazodone 25 mg q8pm  5/24- sleeping much better- con't regimen  Appears to be improving 16. Renal mass: Was set to see MD at [facility] urology--had toberescheduled. 17. Panniculitis- with open weeping wounds- will con't WOC recs with interdry and wound care- might need some Diflucan to help with yeast overgrowth.   Diflucan x 1 week (5/26)  5/30- no significant change- con't regimen 18. Dizziness/lightheadedness-   Improving 19. ?Edema?  5/23- I don't see actual edema on exam- tissues soft, nonpitting- will wait on any diuretics, esp because Cr up to 1.86 20. Pt asked for COVID vaccine- will give today  5/24- received vaccine- has nausea- no body aches- will see if from COVID vaccine? 21. Constipation  5/23- will add Senokot 2 tabs daily-  22. Nausea-   5/24- will check KUB and also give compazine prn- has very quiet bowel sounds.   5/25- KUB (-) 23. UTI  5/25- not sure which came first- UTI and retention or retention causing UTI- anyway, starting Bactrim since allergic to PCN- anaphylaxis- also IVFs NS 75cc/hr and will recheck labs in AM   5/26- con't IVFs 75cc/hour and push fluids PO- also adding Flomax to get pt  to void hopefully- and add foley since retaining- due to body habitus, nursing cannot cath regularly.  5/27-29- Bactrim changed to fosfomycin per Pharmacy- was resistant to bactrim- WBC down to 6.4k --avoid bactrim per nephrology recs  5/30- resolved.  24. Asthma  5/26- will satrt Pulmicort middle dose BID and albuterol inhalers prn per pt's home meds  5/27- d/w nursing- need to make sure doesn't miss pulmicort 25. Losing voice  5/26- will monitor- not painful- could be from allergies combined with vomiting affecting vocal cords?  5/30- is back to normal/resolved   LOS: 11 days A FACE TO [place] 09/04/2020, 10:04 AM

## 2020-09-05 NOTE — Progress Notes (Signed)
Occupational Therapy Session Note  Patient Details  Name: Heather [name] MRN: [id number] Date of Birth: 31-May-1975  Today's Date: 09/05/2020 OT Individual Time: 2595-6387 and 5643-3295 OT Individual Time Calculation (min): 57 min and 27 min   Short Term Goals: Week 1:  OT Short Term Goal 1 (Week 1): Pt will complete toilet transfer with no more than 2 assist and LRAD OT Short Term Goal 1 - Progress (Week 1): Met OT Short Term Goal 2 (Week 1): Pt will complete LB dressing at sit<stand level during 1 ADL session with 1 assist OT Short Term Goal 2 - Progress (Week 1): Met OT Short Term Goal 3 (Week 1): Pt will complete 1 grooming task while sitting at the sink to increase OOB tolerance OT Short Term Goal 3 - Progress (Week 1): Progressing toward goal Week 2:  OT Short Term Goal 1 (Week 2): Pt will complete a short distance ambulatory BSC transfer using LRAD and Min A OT Short Term Goal 2 (Week 2): Pt will complete perihygiene with no more than Min A using adaptive strategies/AE as needed OT Short Term Goal 3 (Week 2): Pt will complete 1 grooming task while standing at the sink using bari walker to increase standing endurance   Skilled Therapeutic Interventions/Progress Updates:    Pt greeted at time of session with social worker present, finishing up talking with pt regarding social services with her children. Pt agreeable to OOB activity, when initiating bed mobility, RT entered at this time and performed breathing treatment, OT getting toileting aide while RT in room, discussed with pt how to use for future toileting tasks. Bed mobility to sit EOB Min A from flat bed using momentum to bring self up to sitting. Stand pivot bed > wheelchair CGA with RW, set up at sink for bathing tasks. Mod A overall for bathing for washing under pannus and ensuring skin is dry. Discussed how to best do this at home, recommending assist from family or laying supine in bed to reach. Wanting to wash hair,  therapist assist with hair washing using board at sink level, pt drying and grooming for UB strength/endurance tasks. Donned socks for pt d/t time restraints. Up in wheelchair waiting for next PT session, call bell in reach all needs met.    Session 2: Pt greeted at time of session emotional, stating CPS now has custody of her children, emotional support provided. Agreeable to OT session needing to use toilet. Supine > sit CGA using bed features and stand pivot bed > BSC with CGA w/ RW. Education on use of hygiene aide to trial, attempting to lean forward to reach buttocks but unable to fully clean, able to clean more in standing with unilateral support on AD. Therapist going behind patient to ensure cleanliness before stand pivot back to bed CGA. Pt stating she did not actually wipe periarea when she would urinate at home, would only "drip dry" and did not actually wipe. Encouraged to try using hygiene aide in future sessions. Sit > supine Min/Mod call bell in reach all needs met.   Therapy Documentation Precautions:  Precautions Precautions: Fall Precaution Comments: reports R knee buckling in standing, 2 previous recent falls. 3 in the past 6 months, skin breakdown in periareas Restrictions Weight Bearing Restrictions: No     Therapy/Group: Individual Therapy  Viona Gilmore 09/05/2020, 7:14 AM

## 2020-09-05 NOTE — Progress Notes (Signed)
Chaplain responded to request to see patient. Patient was in a very emotional state and broke down in tears when she was telling the Chaplain that they took her 2 kids away today. She said they are in temporary foster care and she might can get them back if she can find a place to live. SW is trying to help her do that but there are waiting lists on everything. Patient has a lot of health problems. She could not sit up to eat and she was eating her dinner from a plate on her chest while lying flat. Chaplain is concerned about this patient. She asked for prayer and Chaplain prayed for her and told her that Chaplain is available when needed.    09/05/20 1830  Clinical Encounter Type  Visited With Patient  Visit Type Initial  Referral From Nurse  Consult/Referral To Chaplain  Spiritual Encounters  Spiritual Needs Emotional;Prayer  Stress Factors  Patient Stress Factors Family relationships;Health changes;Loss of control

## 2020-09-05 NOTE — Progress Notes (Signed)
Patient ID: Heather [name], female   DOB: 1975-07-29, 45 y.o.   MRN: [id number]   15 minutes family conference scheduled on Thursday, June 2 at [place], [place]

## 2020-09-05 NOTE — Progress Notes (Addendum)
Patient ID: Heather [name], female   DOB: April 21, 1975, 45 y.o.   MRN: [id number] Team Conference Report to Patient/Family  Team Conference discussion was reviewed with the patient and caregiver, including goals, any changes in plan of care and target discharge date.  Patient and caregiver express understanding and are in agreement.  The patient has a target discharge date of 09/13/20.  SW met with pt in room. Provided conference updates. Pt updates SW that she called around to some of the housing resources, awaiting a call back. Pt mentioned mother was preparing for a meeting at [employer] with her friend [name]) and pastor [name]) and they were phoning her in at 1:00PM. SW will follow up with pt mother this evening. Family conference scheduled on Thursday, June 2nd at 10:00 [place] 09/05/2020, 12:42 PM

## 2020-09-05 NOTE — Progress Notes (Signed)
Physical Therapy Session Note  Patient Details  Name: Heather [name] MRN: [id number] Date of Birth: 1975-09-29  Today's Date: 09/05/2020 PT Individual Time: 0865-7846 PT Individual Time Calculation (min): 69 min   Short Term Goals: Week 1:  PT Short Term Goal 1 (Week 1): pt to demonstrate supine<>sit min A consistently PT Short Term Goal 2 (Week 1): pt to demonstrate sit<>stand transfer with LRAD mod A x1 consistently PT Short Term Goal 3 (Week 1): pt to demonstrate SPT with LRAD mod A x1 consistently PT Short Term Goal 4 (Week 1): pt to ambulate with LRAD 10' mod A x1 Week 2:     Skilled Therapeutic Interventions/Progress Updates:    Pain:  Pt reports 7/10 back and feet pain.  Treatment to tolerance.  Rest breaks and repositioning as needed.  Pt initially oob in wc and agreeable to treatment session w/focus on standing tolerance, global strengfthening.  Pt transported to gym. Sit to stand to [facility] from [facility] w/min assist, additional time to achieve upright trunk. Gait x 80ft w/cga to mat, turn/sit to mat w/walker and cues for safety.  Sit to stand as above.  Stands w/bariwalker and performs alternating forward punches w/4lb wt 2 x 5 each before needing to sit for rest break. Repeated Sit to stand as above and performed overhead press w/4 lb 1x5 each, wihtout weight 1x5 each.  Required seated rest. Repeated Sit to stand as above and performed alternating bicep curls w/4lb wt 2x5 each followed by seated rest.  Sit to stand to RW and gait x 73ft w/cga, deivations related to body habitus, cues for safety w/stand to sit.    Nursing requested PT obtain weight.  Pt transported to day room.  Gait 59ft to mat w/cga as above. bariscale positioned and Sit to stand to scale w/additional time to come to upright, wt obtained: 449.2lbs  Sit to stand from mat and gait x 57ft to wc as above.  Pt transported to room.  Sit to stand from wc w/additional time, cga, short distance gait to bed  cga. Sit to supine max assist for LE management. Pt left supine w/rails up x 4, alarm set, bed in lowest position, and needs in reach.    Therapy Documentation Precautions:  Precautions Precautions: Fall Precaution Comments: reports R knee buckling in standing, 2 previous recent falls. 3 in the past 6 months, skin breakdown in periareas Restrictions Weight Bearing Restrictions: No    Therapy/Group: Individual Therapy  Jerrilyn Cairo 09/05/2020, 11:13 AM

## 2020-09-05 NOTE — Progress Notes (Signed)
PROGRESS NOTE   Subjective/Complaints:  Pt refusing CPAP still.  Pt reports voiding OK- "good amounts'- is drinking 6-8 cups of water or non caffeinated drinks/day.   Per pt, walked a total of 135 ft with RW yesterday- max at 1 time was 9ft!!! We had a little excitement over this.   ROS:   Pt denies SOB, abd pain, CP, N/V/C/D, and vision changes   Objective:   No results found. Recent Labs    09/04/20 0503  WBC 9.1  HGB 8.5*  HCT 30.0*  PLT 570*   Recent Labs    09/03/20 0529 09/04/20 0503  NA 137 137  K 4.0 4.3  CL 106 107  CO2 26 23  GLUCOSE 77 85  BUN 14 12  CREATININE 1.65* 1.51*  CALCIUM 8.7* 9.1    Intake/Output Summary (Last 24 hours) at 09/05/2020 0831 Last data filed at 09/04/2020 1740 Gross per 24 hour  Intake 709 ml  Output 400 ml  Net 309 ml        Physical Exam: Vital Signs Blood pressure (!) 121/58, pulse 89, temperature 98.4 F (36.9 C), temperature source Oral, resp. rate 18, height 5\' 3"  (1.6 m), weight (!) 204.6 kg, SpO2 96 %.         General: awake, alert, appropriate, laying on low air loss bariatric mattress; NAD HENT: conjugate gaze; oropharynx moist CV: regular rate; no JVD Pulmonary: CTA B/L; no W/R/R- good air movement- quiet breath sounds- hypoventilation GI: soft, NT, ND, (+)BS- protuberant Psychiatric: appropriate- more interactive today Neurological: alert Skin:   Wounds under pannus- interdry in place-still some weeping- no change Musc: no obvious edema Motor: Bilateral upper extremities: 4+/5 proximal distal Bilateral lower extremities: Hip flexion, knee extension 4/5, ankle dorsiflexion 5/5    Assessment/Plan: 1. Functional deficits which require 3+ hours per day of interdisciplinary therapy in a comprehensive inpatient rehab setting.  Physiatrist is providing [place] team supervision and 24 hour management of active medical problems listed  below.  Physiatrist and rehab team continue to assess barriers to discharge/monitor patient progress toward functional and medical goals  Care Tool:  Bathing    Body parts bathed by patient: Right arm,Left arm,Chest,Abdomen,Right upper leg,Left upper leg,Right lower leg,Face,Left lower leg   Body parts bathed by helper: Right lower leg Body parts n/a: Front perineal area,Buttocks (declined)   Bathing assist Assist Level: Minimal Assistance - Patient > 75%     Upper Body Dressing/Undressing Upper body dressing   What is the patient wearing?: Pull over shirt    Upper body assist Assist Level: Minimal Assistance - Patient > 75%    Lower Body Dressing/Undressing Lower body dressing      What is the patient wearing?: Pants     Lower body assist Assist for lower body dressing: Moderate Assistance - Patient 50 - 74%     Toileting Toileting    Toileting assist Assist for toileting: Dependent - Patient 0%     Transfers Chair/bed transfer  Transfers assist  Chair/bed transfer activity did not occur: Safety/medical concerns  Chair/bed transfer assist level: Contact Guard/Touching assist Chair/bed transfer assistive device: Programmer, multimedia   Ambulation assist  Ambulation activity did not occur: Safety/medical concerns  Assist level: 2 helpers (CGA & +2 w/c follow) Assistive device: Walker-rolling Max distance: 66ft   Walk 10 feet activity   Assist  Walk 10 feet activity did not occur: Safety/medical concerns  Assist level: 2 helpers (CGA & +2 w/c follow) Assistive device: Walker-rolling   Walk 50 feet activity   Assist Walk 50 feet with 2 turns activity did not occur: Safety/medical concerns  Assist level: 2 helpers (CGA & +2 w/c follow) Assistive device: Walker-rolling    Walk 150 feet activity   Assist Walk 150 feet activity did not occur: Safety/medical concerns         Walk 10 feet on uneven surface  activity   Assist  Walk 10 feet on uneven surfaces activity did not occur: Safety/medical concerns         Wheelchair     Assist Will patient use wheelchair at discharge?: Yes (Per PT long term goals) Type of Wheelchair: Manual Wheelchair activity did not occur: Safety/medical concerns         Wheelchair 50 feet with 2 turns activity    Assist    Wheelchair 50 feet with 2 turns activity did not occur: Safety/medical concerns       Wheelchair 150 feet activity     Assist  Wheelchair 150 feet activity did not occur: Safety/medical concerns       Blood pressure (!) 121/58, pulse 89, temperature 98.4 F (36.9 C), temperature source Oral, resp. rate 18, height 5\' 3"  (1.6 m), weight (!) 204.6 kg, SpO2 96 %.   Medical Problem List and Plan: 1.Debilitysecondary topanniculitis and SIRS  -con't PT and OT- walked 60 ft and total 135 ft yesterday- FYI 2. Antithrombotics: -DVT/anticoagulation:Pharmaceutical:Lovenox -antiplatelet therapy: N/A 3.Chronic back pain/Pain Management:Used Mobic and robaxin. Continue tylenol prn  5/30- denies pain- con't tylenol prn- NO NSAIDs per renal  5/31- pain prn tylenol.  4. Mood:LCSW to follow for evaluation and support. -antipsychotic agents: N/A 5. Neuropsych: This patientiscapable of making decisions on herown behalf. 6. Skin/Wound Care:Routine pressure relief measures. --Continue Silvadene and Interdry per WOC recommendations.  --is Malnourished with albumin 2.1.  Added collagen and protein to promote wound healing --Add vitamin C, Zinc and multivitamin as supplements.  7. Fluids/Electrolytes/Nutrition:Monitor I/O.  8. Acute on chronic renal failure with urinary retention:   Creatinine 1.47 on 5/20, labs ordered for Monday  5/23- Cr up to 1.86- will encourage lfuids and recheck Wednesday  5/25- Cr up to 2.14- will start IVFs 75cc/hr NS and recheck in AM- if not much  better, will start Foley since likely post obstructive nephropathy, esp since cath volume after voiding was 450cc-  5/26- will place foley and add Flomax to get pt to void   5/29 BUN/Cr demonstrating improvement. appreciate nephrology consult for AKI, continue per recs   -continue foley   5/30- will d/c foley but recheck labs Thursday to see if retaining/Cr up?- d/c IVFs Cr down to 1.51  5/31- labs THursday 9. Hyponatremia: Will continue to monitor. Question dilutional   Sodium 133 on 5/20, labs ordered for Monday    Na up to 137- con't to monitor 10. Anemia of chronic diease: Monitor for signs of bleeding. H/H has been 7-8 range.   Hemoglobin 8.7 on 5/20  5/27- Hb 8.2- con't ot monitor  Continue to monitor 11. COPD/OHS/OSA: Has declinedCPAP use. Encourage pulmonary hygeine. On Singulair.  5/31- refusing CPAP! Encouraged her to use.  12. Schizoaffective bipolar d/o: Continue Wellbutrin with  cymbalta and buspar.. 13. RLS: On Requip. 1. Super super obesity: Educate on appropriate diet, self care and weight loss to help promote mobility and overall health.  5/23- weight down some to 204.6 kg- con't hospital diet.   5/31- will have staff weigh patient on stnading scale with therapy and document- order placed.  15. Chronic Insomnia: Seroquel started a month ago-caused dizziness and ineffective  5/20- will try trazodone 25 mg q8pm  5/24- sleeping much better- con't regimen  Appears to be improving 16. Renal mass: Was [facility] at [facility] urology--had toberescheduled. 17. Panniculitis- with open weeping wounds- will con't WOC recs with interdry and wound care- might need some Diflucan to help with yeast overgrowth.   Diflucan x 1 week (5/26)  5/30- no significant change- con't regimen 18. Dizziness/lightheadedness-   Improving 19. ?Edema?  5/23- I don't see actual edema on exam- tissues soft, nonpitting- will wait on any diuretics, esp because Cr up to 1.86 20. Pt asked for COVID  vaccine- will give today  5/24- received vaccine- has nausea- no body aches- will see if from COVID vaccine? 21. Constipation  5/23- will add Senokot 2 tabs daily-  22. Nausea-   5/24- will check KUB and also give compazine prn- has very quiet bowel sounds.   5/25- KUB (-)  5/31- resolved 23. UTI  5/25- not sure which came first- UTI and retention or retention causing UTI- anyway, starting Bactrim since allergic to PCN- anaphylaxis- also IVFs NS 75cc/hr and will recheck labs in AM   5/26- con't IVFs 75cc/hour and push fluids PO- also adding Flomax to get pt to void hopefully- and add foley since retaining- due to body habitus, nursing cannot cath regularly.  5/27-29- Bactrim changed to fosfomycin per Pharmacy- was resistant to bactrim- WBC down to 6.4k --avoid bactrim per nephrology recs  5/30- resolved.  5/31- foley out and drinking well  24. Asthma  5/26- will satrt Pulmicort middle dose BID and albuterol inhalers prn per pt's home meds  5/27- d/w nursing- need to make sure doesn't miss pulmicort 25. Losing voice  5/26- will monitor- not painful- could be from allergies combined with vomiting affecting vocal cords?  5/30- is back to normal/resolved  LOS: 12 days A FACE TO FACE EVALUATION WAS PERFORMED  Rachana Malesky 09/05/2020, 8:31 AM

## 2020-09-05 NOTE — Progress Notes (Signed)
Occupational Therapy Note  Patient Details  Name: Heather [name] MRN: [id number] Date of Birth: Jul 23, 1975  Today's Date: 09/05/2020 OT Missed Time: 59 Minutes Missed Time Reason: Other (comment) (pt in tears and requesting Chaplain visit)  Pt in tears upon arrival. Pt commented that "they just took my children away." Pt requested visit from [place]. Charge Nurse [place] notified.   Leotis Shames [facility] 09/05/2020, 2:15 PM

## 2020-09-05 NOTE — Patient Care Conference (Signed)
Inpatient RehabilitationTeam Conference and Plan of Care Update Date: 09/05/2020   Time: 11:36 AM    Patient Name: Heather [name]      Medical Record Number: [id number]  Date of Birth: 1975/07/11 Sex: Female         Room/Bed: 4M08C/4M08C-01 Payor Info: Payor: MEDICAID [place] / Plan: MEDICAID [place] ACCESS / Product Type: *No Product type* /    Admit Date/Time:  08/24/2020  4:02 PM  Primary Diagnosis:  [facility] Problems: Principal Problem:   Debility Active Problems:   Panniculitis   Class 3 severe obesity with serious comorbidity and body mass index (BMI) greater than or equal to 70 in adult [facility])   Physical debility   Insomnia   Anemia of chronic disease   Hyponatremia   AKI (acute kidney injury) ([place])   Chronic bilateral low back pain without sciatica   Super-super obese [facility])    Expected Discharge Date: Expected Discharge Date: 09/13/20  Team Members Present: Physician leading conference: Dr. Courtney Heys Care Coodinator Present: Dorthula Nettles, RN, BSN, CRRN;Christina [place], BSW Nurse Present: Dorthula Nettles, RN PT Present: Excell Seltzer, PT OT Present: Lillia Corporal, OT PPS Coordinator present : Gunnar Fusi, SLP     Current Status/Progress Goal Weekly Team Focus  Bowel/Bladder   continent x2 [facility] 5/29  Patient will remain continent  Continue Q2H toileting and offer toileting PRN.   Swallow/Nutrition/ Hydration             ADL's   Mod bathing sink level, bed mobility Min, stand pivots CGA w/ RW, using AE for ADLs  Supervision transfers, Min ADL  ADL retraining, AE training, standing balance/tolerance, OOB tolerance, general endurance   Mobility   mod to max A bed mobility, min A transfers with RW, gait up to 60 ft with RW CGA  CGA to min A  increasing endurance, transfers, gait   Communication   Speech clear, able to make needs known  Will continue to verbal needs      Safety/Cognition/ Behavioral Observations            Pain   Denies  pain  Patient will continue to be pain free      Skin   Patient have healin blisters to abdominal folds. Skin care to area provided once a shift  Continue skin care till blisters completely heal  ongoing skin care qshift and prn     Discharge Planning:  SW awaiting family confirmation or denial on hotel   Team Discussion: Need a weight on this patient, foley was removed yesterday, going to recheck labs on Thursday. Needs to have minimal caffeine intake. Cr is almost back to baseline. Refusing CPAP. Family is requesting a meeting with the team. Incontinent B/B. MASD to the groin and buttocks. Patient on target to meet rehab goals: yes, making some progress, mod assist for bathing at the sink level. Requires help for under her skin folds. Trying adaptive equipment. Mother can only provide supervision help. Patient is mod/max assist for bed mobility.   *See Care Plan and progress notes for long and short-term goals.   Revisions to Treatment Plan:  Family conference scheduled for Thursday. Teaching Needs: Family education, medication management, skin/wound care, transfer training, gait training, balance training, endurance training, safety awareness, bowel/bladder management.  Current Barriers to Discharge: Decreased caregiver support, Medical stability, Home enviroment access/layout, Incontinence, Wound care, Lack of/limited family support, Weight, Medication compliance and Behavior  Possible Resolutions to Barriers: Continue current medications, provide emotional support.  Medical Summary Current Status: incontinent of B/B; MASD /rash of groin/buttocks and pannus/panniculitis- Cr down to 1.51; has dentures  Barriers to Discharge: Weight;Wound care;Home enviroment access/layout;Decreased family/caregiver support;Medical stability;Incontinence  Barriers to Discharge Comments: will need help with hygiene when d/c'd. going to motel, hopefully- mother wants family conference Possible  Resolutions to Barriers/Weekly Focus: can arrange family conference Thursday- refusing CPAP - educated on why needs to wear it- can try different masks; focus- skin healing- d/c 09/13/20- no reason to push it out.   Continued Need for Acute Rehabilitation Level of Care: The patient requires daily medical management by a physician with specialized training in physical medicine and rehabilitation for the following reasons: Direction of a multidisciplinary physical rehabilitation program to maximize functional independence : Yes Medical management of patient stability for increased activity during participation in an intensive rehabilitation regime.: Yes Analysis of laboratory values and/or radiology reports with any subsequent need for medication adjustment and/or medical intervention. : Yes   I attest that I was present, lead the team conference, and concur with the assessment and plan of the team.   Cristi Loron 09/05/2020, 5:53 PM

## 2020-09-06 NOTE — Progress Notes (Signed)
Physical Therapy Session Note  Patient Details  Name: Heather [name] MRN: [id number] Date of Birth: Oct 17, 1975   Today's Date: 09/06/2020 PT Individual Time: 1345-1415 PT Individual Time Calculation (min): 30 min   Short Term Goals: Week 2:  PT Short Term Goal 1 (Week 1): pt to demonstrate supine<>sit min A consistently PT Short Term Goal 2 (Week 1): pt to demonstrate sit<>stand transfer with LRAD mod A x1 consistently PT Short Term Goal 3 (Week 1): pt to demonstrate SPT with LRAD mod A x1 consistently PT Short Term Goal 4 (Week 1): pt to ambulate with LRAD 10' mod A x1  Skilled Therapeutic Interventions/Progress Updates:    Patient in supine and requesting to stay in room since only 30 min session.  Patient supine to sit with min A and increased time to get hips scooted forward.  Performed sit to stand with min A increased time and struggling to get on her feet reports due to R knee issues.  Patient stand step to [facility] as had requested to toilet with bari RW and CGA.  Patient toileted with mod A for hygiene.  Patient ambulated in room x 5', then x 25' with bari RW and min A.  Patient seated in w/c performed shoulder press and bicep curls with weight from shampoo bottle x 10 each.  Patient returned to EOB then to supine min A to ensure foot clearance into bed.  Scooting up in bed with S.  Patient left with rails up and call bell in reach and b/s table in reach.  Therapy Documentation Precautions:  Precautions Precautions: Fall Precaution Comments: reports R knee buckling in standing, 2 previous recent falls. 3 in the past 6 months, skin breakdown in periareas Restrictions Weight Bearing Restrictions: No Pain: Pain Assessment Pain Scale: 0-10 Pain Score: 5  Faces Pain Scale: Hurts even more Pain Type: Acute pain Pain Location: Knee Pain Orientation: Right Pain Descriptors / Indicators: Aching Pain Frequency: Constant Pain Onset: With Activity Patients Stated Pain Goal: 0 Pain  Intervention(s): Heat applied   Therapy/Group: Individual Therapy  [name]  Magda Kiel, PT 09/06/2020, 12:38 PM

## 2020-09-06 NOTE — Progress Notes (Signed)
Patient instructed to have RN call RT when ready to be placed on CPAP.  Patient showed understanding.

## 2020-09-06 NOTE — Progress Notes (Signed)
Patient ID: Heather [name], female   DOB: 1975-10-10, 45 y.o.   MRN: [id number]   Patient has requested that her mother [name] be the only family allowed to attend family conference on tomorrow. She does not want her mothers church friends to attend as planned. SW has informed the family.   [place], [place]

## 2020-09-06 NOTE — Progress Notes (Signed)
Patient ID: Heather [name], female   DOB: January 06, 1976, 45 y.o.   MRN: [id number]   Pt DME ordered through [facility].  Wheelchair (26 to trial) and Heather [name], [place]

## 2020-09-06 NOTE — Progress Notes (Signed)
Patient stated while taking her breathing treatment earlier she would re-attempt CPAP tonight with the full face mask. RT came back around 2338 with full face masks for patient to try. Patient stated at that time she did not want to try tonight. Patient stated she would try tomorrow night.

## 2020-09-06 NOTE — Progress Notes (Signed)
PROGRESS NOTE   Subjective/Complaints:  Pt refused CPAP again, even after we discussed needs to use- per RT's note.  Pt very upset- they took her twins away yesterday- because there was a concern her mother couldn't care for them.   Very flat affect about this.    ROS:   Pt denies SOB, abd pain, CP, N/V/C/D, and vision changes    Objective:   No results found. Recent Labs    09/04/20 0503  WBC 9.1  HGB 8.5*  HCT 30.0*  PLT 570*   Recent Labs    09/04/20 0503  NA 137  K 4.3  CL 107  CO2 23  GLUCOSE 85  BUN 12  CREATININE 1.51*  CALCIUM 9.1    Intake/Output Summary (Last 24 hours) at 09/06/2020 0816 Last data filed at 09/05/2020 1245 Gross per 24 hour  Intake 236 ml  Output --  Net 236 ml        Physical Exam: Vital Signs Blood pressure (!) 100/58, pulse 89, temperature 98 F (36.7 C), resp. rate 19, height 5\' 3"  (1.6 m), weight (!) 203.8 kg, SpO2 96 %.      General: awake, alert, appropriate, more flat than normal; NAD HENT: conjugate gaze; oropharynx moist CV: regular rate; no JVD Pulmonary: CTA B/L; no W/R/R- good air movement- hypoventilation noted still- chronic GI: soft, NT, ND, (+)BS- protuberant/wounds as below Psychiatric: very flat this AM Neurological: alert Skin:   Wounds under pannus- interdry in place-still some weeping- no change Musc: no obvious edema Motor: Bilateral upper extremities: 4+/5 proximal distal Bilateral lower extremities: Hip flexion, knee extension 4/5, ankle dorsiflexion 5/5    Assessment/Plan: 1. Functional deficits which require 3+ hours per day of interdisciplinary therapy in a comprehensive inpatient rehab setting.  Physiatrist is providing [place] team supervision and 24 hour management of active medical problems listed below.  Physiatrist and rehab team continue to assess barriers to discharge/monitor patient progress toward functional and medical  goals  Care Tool:  Bathing    Body parts bathed by patient: Right arm,Left arm,Chest,Abdomen,Right upper leg,Left upper leg,Right lower leg,Face,Left lower leg   Body parts bathed by helper: Right lower leg Body parts n/a: Front perineal area,Buttocks   Bathing assist Assist Level: Moderate Assistance - Patient 50 - 74%     Upper Body Dressing/Undressing Upper body dressing   What is the patient wearing?: Dress    Upper body assist Assist Level: Minimal Assistance - Patient > 75%    Lower Body Dressing/Undressing Lower body dressing      What is the patient wearing?: Pants     Lower body assist Assist for lower body dressing: Moderate Assistance - Patient 50 - 74%     Toileting Toileting    Toileting assist Assist for toileting: Maximal Assistance - Patient 25 - 49%     Transfers Chair/bed transfer  Transfers assist  Chair/bed transfer activity did not occur: Safety/medical concerns  Chair/bed transfer assist level: Contact Guard/Touching assist Chair/bed transfer assistive device: Programmer, multimedia   Ambulation assist   Ambulation activity did not occur: Safety/medical concerns  Assist level: 2 helpers (CGA & +2 w/c follow) Assistive device: Walker-rolling  Max distance: 48ft   Walk 10 feet activity   Assist  Walk 10 feet activity did not occur: Safety/medical concerns  Assist level: 2 helpers (CGA & +2 w/c follow) Assistive device: Walker-rolling   Walk 50 feet activity   Assist Walk 50 feet with 2 turns activity did not occur: Safety/medical concerns  Assist level: 2 helpers (CGA & +2 w/c follow) Assistive device: Walker-rolling    Walk 150 feet activity   Assist Walk 150 feet activity did not occur: Safety/medical concerns         Walk 10 feet on uneven surface  activity   Assist Walk 10 feet on uneven surfaces activity did not occur: Safety/medical concerns         Wheelchair     Assist Will patient  use wheelchair at discharge?: Yes (Per PT long term goals) Type of Wheelchair: Manual Wheelchair activity did not occur: Safety/medical concerns         Wheelchair 50 feet with 2 turns activity    Assist    Wheelchair 50 feet with 2 turns activity did not occur: Safety/medical concerns       Wheelchair 150 feet activity     Assist  Wheelchair 150 feet activity did not occur: Safety/medical concerns       Blood pressure (!) 100/58, pulse 89, temperature 98 F (36.7 C), resp. rate 19, height 5\' 3"  (1.6 m), weight (!) 203.8 kg, SpO2 96 %.   Medical Problem List and Plan: 1.Debilitysecondary topanniculitis and SIRS  -con't PT and OT- walked 60 ft and total 135 ft yesterday- FYI  -con't PT and OT 2. Antithrombotics: -DVT/anticoagulation:Pharmaceutical:Lovenox -antiplatelet therapy: N/A 3.Chronic back pain/Pain Management:Used Mobic and robaxin. Continue tylenol prn  5/30- denies pain- con't tylenol prn- NO NSAIDs per renal  5/31- pain prn tylenol.  4. Mood:LCSW to follow for evaluation and support. -antipsychotic agents: N/A 5. Neuropsych: This patientiscapable of making decisions on herown behalf. 6. Skin/Wound Care:Routine pressure relief measures. --Continue Silvadene and Interdry per WOC recommendations.  --is Malnourished with albumin 2.1.  Added collagen and protein to promote wound healing --Add vitamin C, Zinc and multivitamin as supplements.  7. Fluids/Electrolytes/Nutrition:Monitor I/O.  8. Acute on chronic renal failure with urinary retention:   Creatinine 1.47 on 5/20, labs ordered for Monday  5/23- Cr up to 1.86- will encourage lfuids and recheck Wednesday  5/25- Cr up to 2.14- will start IVFs 75cc/hr NS and recheck in AM- if not much better, will start Foley since likely post obstructive nephropathy, esp since cath volume after voiding was 450cc-  5/26- will place foley and  add Flomax to get pt to void   5/29 BUN/Cr demonstrating improvement. appreciate nephrology consult for AKI, continue per recs   -continue foley   5/30- will d/c foley but recheck labs Thursday to see if retaining/Cr up?- d/c IVFs Cr down to 1.51  6/1- labs Thursday 9. Hyponatremia: Will continue to monitor. Question dilutional   Sodium 133 on 5/20, labs ordered for Monday    Na up to 137- con't to monitor 10. Anemia of chronic diease: Monitor for signs of bleeding. H/H has been 7-8 range.   Hemoglobin 8.7 on 5/20  5/27- Hb 8.2- con't ot monitor  Continue to monitor 11. COPD/OHS/OSA: Has declinedCPAP use. Encourage pulmonary hygeine. On Singulair.  5/31- refusing CPAP! Encouraged her to use.  12. Schizoaffective bipolar d/o: Continue Wellbutrin with cymbalta and buspar.. 13. RLS: On Requip. 30. Super super obesity: Educate on appropriate diet, self care and  weight loss to help promote mobility and overall health.  5/23- weight down some to 204.6 kg- con't hospital diet.   5/31- will have staff weigh patient on stnading scale with therapy and document- order placed.  6/1- Weight down 1 kg to 20.38 kg from 204. 9 kg- in 1 week.   15. Chronic Insomnia: Seroquel started a month ago-caused dizziness and ineffective  5/20- will try trazodone 25 mg q8pm  5/24- sleeping much better- con't regimen  Appears to be improving 16. Renal mass: Was set to see MD at [facility] urology--had toberescheduled. 17. Panniculitis- with open weeping wounds- will con't WOC recs with interdry and wound care- might need some Diflucan to help with yeast overgrowth.   Diflucan x 1 week (5/26)  5/30- no significant change- con't regimen 18. Dizziness/lightheadedness-   Improving 19. ?Edema?  5/23- I don't see actual edema on exam- tissues soft, nonpitting- will wait on any diuretics, esp because Cr up to 1.86 20. Pt asked for COVID vaccine- will give today  5/24- received vaccine- has nausea- no body aches- will  see if from COVID vaccine? 21. Constipation  5/23- will add Senokot 2 tabs daily-  22. Nausea-   5/24- will check KUB and also give compazine prn- has very quiet bowel sounds.   5/25- KUB (-)  5/31- resolved 23. UTI  5/25- not sure which came first- UTI and retention or retention causing UTI- anyway, starting Bactrim since allergic to PCN- anaphylaxis- also IVFs NS 75cc/hr and will recheck labs in AM   5/26- con't IVFs 75cc/hour and push fluids PO- also adding Flomax to get pt to void hopefully- and add foley since retaining- due to body habitus, nursing cannot cath regularly.  5/27-29- Bactrim changed to fosfomycin per Pharmacy- was resistant to bactrim- WBC down to 6.4k --avoid bactrim per nephrology recs  5/30- resolved.  5/31- foley out and drinking well  24. Asthma  5/26- will satrt Pulmicort middle dose BID and albuterol inhalers prn per pt's home meds  5/27- d/w nursing- need to make sure doesn't miss pulmicort 25. Losing voice  5/26- will monitor- not painful- could be from allergies combined with vomiting affecting vocal cords?  5/30- is back to normal/resolved 26. Children taken away from her  6/1- will see if neuropsych vs SW can talk to her today.   LOS: 13 days A FACE TO FACE EVALUATION WAS PERFORMED  Taryn Shellhammer 09/06/2020, 8:16 AM

## 2020-09-06 NOTE — NC FL2 (Signed)
[place] LEVEL OF CARE SCREENING TOOL     IDENTIFICATION  Patient Name: Heather [name] Birthdate: 1975/07/19 Sex: female Admission Date (Current Location): 08/24/2020  [place] and [place] Number:  [name] [id number] r Facility and Address:  [facility], [place] [address] [place], [place] [address]      Provider Number:    Attending Physician Name and Address:  Courtney Heys, MD  Relative Name and Phone Number:  [name] [phone]    Current Level of Care: Hospital Recommended Level of Care: [place] Prior Approval Number:    Date Approved/Denied:   PASRR Number:    Discharge Plan: SNF    Current Diagnoses: Patient Active Problem List   Diagnosis Date Noted  . Insomnia   . Anemia of chronic disease   . Hyponatremia   . AKI (acute kidney injury) ([place])   . Chronic bilateral low back pain without sciatica   . Super-super obese ([place])   . Debility 08/24/2020  . Physical debility 08/24/2020  . SIRS (systemic inflammatory response syndrome) ([place]) 08/18/2020  . Pressure injury of skin 08/11/2020  . Rhabdomyolysis 08/10/2020  . Acute renal failure ([place]) 08/10/2020  . PTSD (post-traumatic stress disorder) 05/15/2020  . Major depressive disorder, recurrent episode, moderate ([place]) 05/05/2020  . Generalized anxiety disorder 05/05/2020  . Blood in stool   . Gastritis and gastroduodenitis   . Benign neoplasm of sigmoid colon   . Difficult intubation 08/10/2018  . Class 3 severe obesity with serious comorbidity and body mass index (BMI) greater than or equal to 70 in adult ([place]) 06/16/2018  . Nexplanon insertion 03/27/2017  . Postpartum hypertension 03/05/2017  . Status post primary low transverse cesarean section 02/03/2017  . H/O pre-eclampsia 01/27/2017  . Gestational htn w/o significant proteinuria, third trimester 01/20/2017  . Mild persistent asthma without complication 45/62/5638  . Perennial  allergic rhinitis 11/05/2016  . Mild persistent asthma with acute exacerbation 11/05/2016  . Excess weight gain in pregnancy, second trimester 10/30/2016  . Hard to intubate 11/07/2015  . Hypoglycemia 11/07/2015  . OSA (obstructive sleep apnea) 11/07/2015  . DM type 2 (diabetes mellitus, type 2) ([place]) 12/07/2014  . Panniculitis 12/06/2014  . Cellulitis, abdominal wall 11/11/2014  . Abdominal pain 07/14/2014  . Loose stools 07/14/2014  . Melena 01/27/2014  . Eosinophilic esophagitis 93/73/4287  . Change in bowel habits 04/28/2013  . Esophageal dysphagia 04/28/2013  . Insomnia due to mental disorder(327.02) 08/08/2011  . RLS (restless legs syndrome) 08/08/2011  . PALPITATIONS, OCCASIONAL 11/01/2009  . DISORDER, TOBACCO USE 08/25/2008  . Leukocytosis 07/28/2008  . ALLERGIC RHINITIS, SEASONAL 08/20/2007  . DYSMETABOLIC SYNDROME 68/02/5725  . Morbid obesity ([place]) 05/13/2006  . EXTERNAL HEMORRHOIDS 05/13/2006  . HYPERLIPIDEMIA 05/12/2006  . Essential hypertension 05/12/2006  . ASTHMA 05/12/2006  . GERD (gastroesophageal reflux disease) 05/12/2006  . OSTEOARTHRITIS 05/12/2006    Orientation RESPIRATION BLADDER Height & Weight     Time,Self,Situation,Place  Normal Continent Weight: (!) 449 lb 3 oz (203.8 kg) Height:  5\' 3"  (160 cm)  BEHAVIORAL SYMPTOMS/MOOD NEUROLOGICAL BOWEL NUTRITION STATUS      Continent Diet  AMBULATORY STATUS COMMUNICATION OF NEEDS Skin   Extensive Assist Verbally Other (Comment) (Patient have healin blisters to abdominal folds)                       Personal Care Assistance Level of Assistance  Bathing,Feeding,Dressing Bathing Assistance: Maximum assistance Feeding assistance: Maximum assistance Dressing Assistance: Maximum assistance  Functional Limitations Info             SPECIAL CARE FACTORS FREQUENCY  PT (By licensed PT),OT (By licensed OT),Speech therapy,Bowel and bladder program     PT Frequency: 5x a week OT Frequency: 5x a  week     Speech Therapy Frequency: 5x a week      Contractures      Additional Factors Info                  Current Medications (09/06/2020):  This is the current hospital active medication list Current Facility-Administered Medications  Medication Dose Route Frequency Provider Last Rate Last Admin  . (feeding supplement) PROSource Plus liquid 30 mL  30 mL Oral BID BM Love, Pamela S, PA-C   30 mL at 09/06/20 1015  . acetaminophen (TYLENOL) tablet 325-650 mg  325-650 mg Oral Q4H PRN Bary Leriche, PA-C   650 mg at 09/05/20 1128  . albuterol (PROVENTIL) (2.5 MG/3ML) 0.083% nebulizer solution 3 mL  3 mL Inhalation Q4H PRN Lovorn, Megan, MD      . ascorbic acid (VITAMIN C) tablet 500 mg  500 mg Oral Daily Love, Pamela S, PA-C   500 mg at 09/06/20 0800  . bisacodyl (DULCOLAX) suppository 10 mg  10 mg Rectal Daily PRN Love, Pamela S, PA-C      . budesonide (PULMICORT) nebulizer solution 0.5 mg  0.5 mg Nebulization BID Lovorn, Megan, MD   0.5 mg at 09/06/20 0819  . buPROPion (WELLBUTRIN XL) 24 hr tablet 300 mg  300 mg Oral Daily Love, Ivan Anchors, PA-C   300 mg at 09/06/20 0800  . busPIRone (BUSPAR) tablet 10 mg  10 mg Oral BID Reesa Chew S, PA-C   10 mg at 09/06/20 0800  . Chlorhexidine Gluconate Cloth 2 % PADS 6 each  6 each Topical BID Courtney Heys, MD   6 each at 09/06/20 0528  . diphenhydrAMINE (BENADRYL) 12.5 MG/5ML elixir 12.5-25 mg  12.5-25 mg Oral Q6H PRN Love, Ivan Anchors, PA-C      . DULoxetine (CYMBALTA) DR capsule 60 mg  60 mg Oral Daily Bary Leriche, PA-C   60 mg at 09/06/20 0800  . enoxaparin (LOVENOX) injection 100 mg  100 mg Subcutaneous Q24H Donnamae Jude, RPH   100 mg at 09/05/20 2115  . fluticasone (FLONASE) 50 MCG/ACT nasal spray 1 spray  1 spray Each Nare Daily Love, Ivan Anchors, PA-C   1 spray at 09/06/20 0800  . folic acid (FOLVITE) tablet 1 mg  1 mg Oral Daily Love, Ivan Anchors, PA-C   1 mg at 09/06/20 0800  . guaiFENesin-dextromethorphan (ROBITUSSIN DM) 100-10 MG/5ML  syrup 5-10 mL  5-10 mL Oral Q6H PRN Love, Pamela S, PA-C      . lidocaine (XYLOCAINE) 2 % jelly 1 application  1 application Urethral PRN Lovorn, Megan, MD      . montelukast (SINGULAIR) tablet 10 mg  10 mg Oral QHS Love, Pamela S, PA-C   10 mg at 09/05/20 2114  . multivitamin with minerals tablet 1 tablet  1 tablet Oral Daily Love, Ivan Anchors, PA-C   1 tablet at 09/06/20 0800  . nutrition supplement (JUVEN) (JUVEN) powder packet 1 packet  1 packet Oral TID with meals Bary Leriche, PA-C   1 packet at 09/03/20 0750  . polyethylene glycol (MIRALAX / GLYCOLAX) packet 17 g  17 g Oral Daily PRN Love, Pamela S, PA-C      . prochlorperazine (COMPAZINE) tablet  5-10 mg  5-10 mg Oral Q6H PRN Bary Leriche, PA-C   10 mg at 08/29/20 2253   Or  . prochlorperazine (COMPAZINE) injection 5-10 mg  5-10 mg Intramuscular Q6H PRN Love, Pamela S, PA-C       Or  . prochlorperazine (COMPAZINE) suppository 12.5 mg  12.5 mg Rectal Q6H PRN Love, Pamela S, PA-C      . promethazine (PHENERGAN) tablet 25 mg  25 mg Oral Q6H PRN Lovorn, Jinny Blossom, MD   25 mg at 08/31/20 0755  . rOPINIRole (REQUIP) tablet 3 mg  3 mg Oral QHS Love, Pamela S, PA-C   3 mg at 09/05/20 2114  . senna-docusate (Senokot-S) tablet 2 tablet  2 tablet Oral Daily Lovorn, Megan, MD   2 tablet at 09/05/20 0800  . silver sulfADIAZINE (SILVADENE) 1 % cream   Topical BID Bary Leriche, PA-C   Given at 09/06/20 0800  . tamsulosin (FLOMAX) capsule 0.4 mg  0.4 mg Oral QPC supper Lovorn, Megan, MD   0.4 mg at 09/05/20 1743  . traZODone (DESYREL) tablet 25 mg  25 mg Oral QHS Lovorn, Megan, MD   25 mg at 09/05/20 2114  . vitamin B-12 (CYANOCOBALAMIN) tablet 5,000 mcg  5,000 mcg Oral Daily Bary Leriche, PA-C   5,000 mcg at 09/06/20 0800  . zinc sulfate capsule 220 mg  220 mg Oral Daily Love, Ivan Anchors, PA-C   220 mg at 09/06/20 0800     Discharge Medications: Please see discharge summary for a list of discharge medications.  Relevant Imaging Results:  Relevant  Lab Results:   Additional Information [facility]

## 2020-09-06 NOTE — Progress Notes (Signed)
Physical Therapy Session Note  Patient Details  Name: Heather [name] MRN: [id number] Date of Birth: [birth year]  Today's Date: 09/06/2020 PT Individual Time: 0825-0935 PT Individual Time Calculation (min): 70 min   Short Term Goals: Week 1:  PT Short Term Goal 1 (Week 1): pt to demonstrate supine<>sit min A consistently PT Short Term Goal 2 (Week 1): pt to demonstrate sit<>stand transfer with LRAD mod A x1 consistently PT Short Term Goal 3 (Week 1): pt to demonstrate SPT with LRAD mod A x1 consistently PT Short Term Goal 4 (Week 1): pt to ambulate with LRAD 10' mod A x1  Skilled Therapeutic Interventions/Progress Updates:    Patient received supine in bed, agreeable to PT with encouragement. She denies pain. Patient reports not sleeping well due to "thinking about (her) girls." Patient maintained very flat affect throughout therapy session and appeared to be pretty withdrawn from activity. Patient requesting to remain in her dark room, but PT able to encourage her to participate in therapy in gym to allow for some light and possible social interaction. Patient requesting to use bathroom first. She was able to come sit edge of bed with CGA -found to have been incontinent of urine. Transfer to [facility] with RW and light CGA. Patient continent of bladder and bowel. MaxA for perihygiene in standing. Patient transferring to wc via stand pivot with RW and CGA. PT transporting patient in wc to therapy gym for time management and energy conservation. The following therex was completed with encouragement to maintain participation: LAQ, marches, chest press, bicep curl 3x12. Patient requiring rest breaks between sets due to fatigue. Patient returning to room in wc, requesting to transfer back to bed. CGA with RW to do so. Supervision to return supine. 4 rails up, call light within reach.    Therapy Documentation Precautions:  Precautions Precautions: Fall Precaution Comments: reports R knee buckling in  standing, 2 previous recent falls. 3 in the past 6 months, skin breakdown in periareas Restrictions Weight Bearing Restrictions: No    Therapy/Group: Individual Therapy  Karoline Caldwell, PT, DPT, CBIS  09/06/2020, 7:46 AM

## 2020-09-06 NOTE — Progress Notes (Signed)
Occupational Therapy Session Note  Patient Details  Name: Kiala Faraj Bergin MRN: [id number] Date of Birth: 10-08-1975  Today's Date: 09/06/2020 OT Individual Time: 1130-1157 and 2831-5176 OT Individual Time Calculation (min): 27 min and 53 min   Short Term Goals: Week 1:  OT Short Term Goal 1 (Week 1): Pt will complete toilet transfer with no more than 2 assist and LRAD OT Short Term Goal 1 - Progress (Week 1): Met OT Short Term Goal 2 (Week 1): Pt will complete LB dressing at sit<stand level during 1 ADL session with 1 assist OT Short Term Goal 2 - Progress (Week 1): Met OT Short Term Goal 3 (Week 1): Pt will complete 1 grooming task while sitting at the sink to increase OOB tolerance OT Short Term Goal 3 - Progress (Week 1): Progressing toward goal Week 2:  OT Short Term Goal 1 (Week 2): Pt will complete a short distance ambulatory BSC transfer using LRAD and Min A OT Short Term Goal 2 (Week 2): Pt will complete perihygiene with no more than Min A using adaptive strategies/AE as needed OT Short Term Goal 3 (Week 2): Pt will complete 1 grooming task while standing at the sink using bari walker to increase standing endurance   Skilled Therapeutic Interventions/Progress Updates:    Session 1: Pt greeted at time of session semireclined in bed resting agreeable to OT session. Stating she got up and went to gym this am with PT, agreeable to get OOB for next session. Pt ed on elevating HOB for self feeding as she tends to be in bed for meals, verbalized understanding. BUE AROM for punches 2x20 for cardiorespiratory endurance. Also washed face and oral rinse with set up. Pt hyperverbal today about children and home situation, emotional support provided. Call bell in reach all needs met.    Session 2: Pt greeted at time of session supine in bed resting, stating she did not get up in previous PT session, agreeable to bed level session as she had some R knee pain and had ice on throughout session,  declining EOB or OOB activity. Attempted to adjust RLE to midline to prevent external rotation at hip, did not improve discomfort. Performed the following bed level UB there ex: chest press, overhead raise, bicep curl, FWD and backward circles with limited rest breaks for endurance and cardio for 2x15-20 reps. Pt noted to be hyperverbal about social situation with her kids, attempting to redirect but pt tangental about previous placement at SNFs/ALFs and bad experience, attempted to redirect to make a plan for her life s/p hospitalization for job, home life, etc. Attempted chair position bed feature as well but mostly HOB elevated, did not go into full chair position. Pt left HOB elevated call bell in reach all needs met.   Therapy Documentation Precautions:  Precautions Precautions: Fall Precaution Comments: reports R knee buckling in standing, 2 previous recent falls. 3 in the past 6 months, skin breakdown in periareas Restrictions Weight Bearing Restrictions: No     Therapy/Group: Individual Therapy  Viona Gilmore 09/06/2020, 7:24 AM

## 2020-09-07 LAB — COMPREHENSIVE METABOLIC PANEL
ALT: 21 U/L (ref 0–44)
AST: 22 U/L (ref 15–41)
Albumin: 2.4 g/dL — ABNORMAL LOW (ref 3.5–5.0)
Alkaline Phosphatase: 52 U/L (ref 38–126)
Anion gap: 8 (ref 5–15)
BUN: 18 mg/dL (ref 6–20)
CO2: 28 mmol/L (ref 22–32)
Calcium: 9.5 mg/dL (ref 8.9–10.3)
Chloride: 99 mmol/L (ref 98–111)
Creatinine, Ser: 1.4 mg/dL — ABNORMAL HIGH (ref 0.44–1.00)
GFR, Estimated: 48 mL/min — ABNORMAL LOW (ref >60)
Glucose, Bld: 92 mg/dL (ref 70–99)
Potassium: 4.2 mmol/L (ref 3.5–5.1)
Sodium: 135 mmol/L (ref 135–145)
Total Bilirubin: 0.3 mg/dL (ref 0.3–1.2)
Total Protein: 6.5 g/dL (ref 6.5–8.1)

## 2020-09-07 MED ORDER — TRAMADOL HCL 50 MG PO TABS
50.0000 mg | ORAL_TABLET | Freq: Four times a day (QID) | ORAL | Status: DC | PRN
  Administered 2020-09-08 – 2020-09-14 (×7): 50 mg via ORAL
  Filled 2020-09-07 (×7): qty 1

## 2020-09-07 NOTE — Progress Notes (Signed)
Occupational Therapy Session Note  Patient Details  Name: Heather [name] MRN: [id number] Date of Birth: [birth year]  Today's Date: 09/07/2020 OT Individual Time: 1000-1009 and 1515-1550 OT Individual Time Calculation (min): 9 min and 35 min Missed 10 mins in PM session d/t fatigue/refusal   Short Term Goals: Week 2:  OT Short Term Goal 1 (Week 2): Pt will complete a short distance ambulatory BSC transfer using LRAD and Min A OT Short Term Goal 2 (Week 2): Pt will complete perihygiene with no more than Min A using adaptive strategies/AE as needed OT Short Term Goal 3 (Week 2): Pt will complete 1 grooming task while standing at the sink using bari walker to increase standing endurance   Skilled Therapeutic Interventions/Progress Updates:    Pt greeted at time of session supine in bed resting with mother present and SW for family meeting/conference to discuss CLOF with self care needs. Pt present throughout. OT shared that the pt does need Min/Mod with bathing tasks for buttocks area and under pannus for thoroughness for cleaning and drying, Mod A for hygiene with use of hygiene aide and assistance, CGA/Min for stand pivot transfers with RW. Mother and pt understanding. Mother saying she "cannot do it" to provide level of care needed. Hand off to social work.   Session 2: Pt greeted at time of session supine in bed resting agreeable to OT session with encouragement, stating she was fatigued from previous sessions but only stood with OT in Am session and did bed level PT previously. Max encouragement to come EOB but did so with CGA. Encouraged pt to stand repeatedly at [facility] from home, refused to do so d/t fatigue and not feeling "good about it." switched to seated core activity for bending/reaching for clips and pt trying to quit early, Max encouragement to continue. Refusing to take clips back to bucket to assist with cleaning up. Pt returned to supine Min A. Missed 10 mins d/t refusal/fatigue.    Therapy Documentation Precautions:  Precautions Precautions: Fall Precaution Comments: reports R knee buckling in standing, 2 previous recent falls. 3 in the past 6 months, skin breakdown in periareas Restrictions Weight Bearing Restrictions: No     Therapy/Group: Individual Therapy  Viona Gilmore 09/07/2020, 12:18 PM

## 2020-09-07 NOTE — Progress Notes (Signed)
PROGRESS NOTE   Subjective/Complaints:  Pt refused CPAP again- R knee/leg is bothering her- tylenol doesn't work.   BMs daily now.  Very Tender over knee overall per pt and hard to stand due to pain.    ROS:   Pt denies SOB, abd pain, CP, N/V/C/D, and vision changes    Objective:   No results found. No results for input(s): WBC, HGB, HCT, PLT in the last 72 hours. Recent Labs    09/07/20 0451  NA 135  K 4.2  CL 99  CO2 28  GLUCOSE 92  BUN 18  CREATININE 1.40*  CALCIUM 9.5    Intake/Output Summary (Last 24 hours) at 09/07/2020 9371 Last data filed at 09/07/2020 0730 Gross per 24 hour  Intake 954 ml  Output --  Net 954 ml        Physical Exam: Vital Signs Blood pressure (!) 106/52, pulse 86, temperature 97.9 F (36.6 C), resp. rate 18, height 5\' 3"  (1.6 m), weight (!) 203.8 kg, SpO2 96 %.     General: awake, alert, appropriate, BMI >79; in low air loss bariatric bed;  NAD HENT: conjugate gaze; oropharynx moist CV: regular rate; no JVD Pulmonary: CTA B/L; no W/R/R- good air movement GI: soft, NT, ND, (+)BS Psychiatric: appropriate; flat affect Neurological: alert Skin:   Wounds under pannus- interdry in place-still some weeping- no change Musc: no obvious edema; also VERY TTP over R patella- esp with moving patella slightly. No heat or no erythema Motor: Bilateral upper extremities: 4+/5 proximal distal Bilateral lower extremities: Hip flexion, knee extension 4/5, ankle dorsiflexion 5/5    Assessment/Plan: 1. Functional deficits which require 3+ hours per day of interdisciplinary therapy in a comprehensive inpatient rehab setting.  Physiatrist is providing [place] team supervision and 24 hour management of active medical problems listed below.  Physiatrist and rehab team continue to assess barriers to discharge/monitor patient progress toward functional and medical goals  Care Tool:  Bathing     Body parts bathed by patient: Right arm,Left arm,Chest,Abdomen,Right upper leg,Left upper leg,Right lower leg,Face,Left lower leg   Body parts bathed by helper: Right lower leg Body parts n/a: Front perineal area,Buttocks   Bathing assist Assist Level: Moderate Assistance - Patient 50 - 74%     Upper Body Dressing/Undressing Upper body dressing   What is the patient wearing?: Dress    Upper body assist Assist Level: Minimal Assistance - Patient > 75%    Lower Body Dressing/Undressing Lower body dressing      What is the patient wearing?: Pants     Lower body assist Assist for lower body dressing: Moderate Assistance - Patient 50 - 74%     Toileting Toileting    Toileting assist Assist for toileting: Maximal Assistance - Patient 25 - 49%     Transfers Chair/bed transfer  Transfers assist  Chair/bed transfer activity did not occur: Safety/medical concerns  Chair/bed transfer assist level: Contact Guard/Touching assist Chair/bed transfer assistive device: Programmer, multimedia   Ambulation assist   Ambulation activity did not occur: Safety/medical concerns  Assist level: 2 helpers (CGA & +2 w/c follow) Assistive device: Walker-rolling Max distance: 56ft   Walk  10 feet activity   Assist  Walk 10 feet activity did not occur: Safety/medical concerns  Assist level: 2 helpers (CGA & +2 w/c follow) Assistive device: Walker-rolling   Walk 50 feet activity   Assist Walk 50 feet with 2 turns activity did not occur: Safety/medical concerns  Assist level: 2 helpers (CGA & +2 w/c follow) Assistive device: Walker-rolling    Walk 150 feet activity   Assist Walk 150 feet activity did not occur: Safety/medical concerns         Walk 10 feet on uneven surface  activity   Assist Walk 10 feet on uneven surfaces activity did not occur: Safety/medical concerns         Wheelchair     Assist Will patient use wheelchair at discharge?: Yes  (Per PT long term goals) Type of Wheelchair: Manual Wheelchair activity did not occur: Safety/medical concerns         Wheelchair 50 feet with 2 turns activity    Assist    Wheelchair 50 feet with 2 turns activity did not occur: Safety/medical concerns       Wheelchair 150 feet activity     Assist  Wheelchair 150 feet activity did not occur: Safety/medical concerns       Blood pressure (!) 106/52, pulse 86, temperature 97.9 F (36.6 C), resp. rate 18, height 5\' 3"  (1.6 m), weight (!) 203.8 kg, SpO2 96 %.   Medical Problem List and Plan: 1.Debilitysecondary topanniculitis and SIRS  -con't PT and OT- walked 60 ft and total 135 ft yesterday- FYI  -con't PT and OT- said had difficulty stnading yesterday due to R knee pain.  2. Antithrombotics: -DVT/anticoagulation:Pharmaceutical:Lovenox -antiplatelet therapy: N/A 3.Chronic back pain/Pain Management:Used Mobic and robaxin. Continue tylenol prn  5/30- denies pain- con't tylenol prn- NO NSAIDs per renal  6/2- having new onset R knee pain. Doesn't appear to be gout based on clinical exam- will try Tramadol 50 mg q6 hours prn for pain 4. Mood:LCSW to follow for evaluation and support. -antipsychotic agents: N/A 5. Neuropsych: This patientiscapable of making decisions on herown behalf. 6. Skin/Wound Care:Routine pressure relief measures. --Continue Silvadene and Interdry per WOC recommendations.  --is Malnourished with albumin 2.1.  Added collagen and protein to promote wound healing --Add vitamin C, Zinc and multivitamin as supplements.   6/2- Albumin up to 2.4!- con't regimen 7. Fluids/Electrolytes/Nutrition:Monitor I/O.  8. Acute on chronic renal failure with urinary retention:   Creatinine 1.47 on 5/20, labs ordered for Monday  5/23- Cr up to 1.86- will encourage lfuids and recheck Wednesday  5/25- Cr up to 2.14- will start IVFs 75cc/hr  NS and recheck in AM- if not much better, will start Foley since likely post obstructive nephropathy, esp since cath volume after voiding was 450cc-  5/26- will place foley and add Flomax to get pt to void   5/29 BUN/Cr demonstrating improvement. appreciate nephrology consult for AKI, continue per recs   -continue foley   5/30- will d/c foley but recheck labs Thursday to see if retaining/Cr up?- d/c IVFs Cr down to 1.51  66/2- Cr down to 1.40- so likely no post obstructive nephropathy.  9. Hyponatremia: Will continue to monitor. Question dilutional   Sodium 133 on 5/20, labs ordered for Monday    Na up to 137- con't to monitor  6/2- Na 135- con't regimen 10. Anemia of chronic diease: Monitor for signs of bleeding. H/H has been 7-8 range.   Hemoglobin 8.7 on 5/20  5/27- Hb 8.2- con't ot  monitor  Continue to monitor 11. COPD/OHS/OSA: Has declinedCPAP use. Encourage pulmonary hygeine. On Singulair.  5/31- refusing CPAP! Encouraged her to use.  12. Schizoaffective bipolar d/o: Continue Wellbutrin with cymbalta and buspar.. 13. RLS: On Requip. 30. Super super obesity: Educate on appropriate diet, self care and weight loss to help promote mobility and overall health.  5/23- weight down some to 204.6 kg- con't hospital diet.   5/31- will have staff weigh patient on stnading scale with therapy and document- order placed.  6/1- Weight down 1 kg to 203.8 kg from 204. 9 kg- in 1 week.   15. Chronic Insomnia: Seroquel started a month ago-caused dizziness and ineffective  5/20- will try trazodone 25 mg q8pm  5/24- sleeping much better- con't regimen  Appears to be improving 16. Renal mass: Was set to see MD at [facility] urology--had toberescheduled. 17. Panniculitis- with open weeping wounds- will con't WOC recs with interdry and wound care- might need some Diflucan to help with yeast overgrowth.   Diflucan x 1 week (5/26)  5/30- no significant change- con't regimen 18. Dizziness/lightheadedness-    Improving 19. ?Edema?  5/23- I don't see actual edema on exam- tissues soft, nonpitting- will wait on any diuretics, esp because Cr up to 1.86 20. Pt asked for COVID vaccine- will give today  5/24- received vaccine- has nausea- no body aches- will see if from COVID vaccine? 21. Constipation  5/23- will add Senokot 2 tabs daily-   6/2- going daily now- encouraged pt to ask for miralax if gets constipated with tramadol.  22. Nausea-   5/24- will check KUB and also give compazine prn- has very quiet bowel sounds.   5/25- KUB (-)  5/31- resolved 23. UTI  5/25- not sure which came first- UTI and retention or retention causing UTI- anyway, starting Bactrim since allergic to PCN- anaphylaxis- also IVFs NS 75cc/hr and will recheck labs in AM   5/26- con't IVFs 75cc/hour and push fluids PO- also adding Flomax to get pt to void hopefully- and add foley since retaining- due to body habitus, nursing cannot cath regularly.  5/27-29- Bactrim changed to fosfomycin per Pharmacy- was resistant to bactrim- WBC down to 6.4k --avoid bactrim per nephrology recs  5/30- resolved.  5/31- foley out and drinking well  24. Asthma  5/26- will satrt Pulmicort middle dose BID and albuterol inhalers prn per pt's home meds  5/27- d/w nursing- need to make sure doesn't miss pulmicort 25. Losing voice  5/26- will monitor- not painful- could be from allergies combined with vomiting affecting vocal cords?  5/30- is back to normal/resolved 26. Children taken away from her  6/1- will see if neuropsych vs SW can talk to her today.   6/2- hasn't seen neuropsych- will see if possible?  LOS: 14 days A FACE TO FACE EVALUATION WAS PERFORMED  Adama Ferber 09/07/2020, 8:22 AM

## 2020-09-07 NOTE — Progress Notes (Addendum)
Patient ID: Heather [name], female   DOB: 1975/08/06, 45 y.o.   MRN: [id number]  Family meeting held today with pt and pt mother [name].  Staff in attendance: Dr. Dagoberto Ligas, Hildred Alamin (PT), Jarrett Soho (OT), Margreta Journey (SW)  Meeting held to discuss patient progress and LOC at discharge. Upon entrance patient mother stated "I cannot take her". Sw allow PT and OT to provide pt mother with progress.  PT: patient making process with mobility. Supervision for BM, Min A for standing but pt able to do 80% of the work, walking with RW about 60 FT with rest breaks, able to ambulate household distances, does not require much assistance physically.   OT: Patient able to transfer to the [facility] at supervision level, able to complete clothing management independently, doing well mobility wise, able to roll in bed, able to complete sink level bathing, patient will only require assistance with hygiene after using the bathroom  Patient mother reports she is still physically unable to care for patient. SW discussed with pt and mother of the agreement for patient to d/c back to hotel due to patient having no other discharge options. SNF is not an option for patient due to care needs, weight limit and no active discharge plan beyond SNF. Sw discussed with patient that she will be unable to stay a hospital and discussed options with her. Pt very flat about discussing other options or options of family/friends that are able to assist with hygiene. SW will follow up with patient this evening to continue discussing discharge plans due to scheduled OT session.  Dr. Dagoberto Ligas will discuss medical updates/progress with pt mother

## 2020-09-07 NOTE — Progress Notes (Addendum)
Patient ID: Heather [name] Sleep, female   DOB: 1975/06/06, 45 y.o.   MRN: [id number]  [name] address is [address], [address], [place], [place] [address]   Pt reports she does not have the funds to pay for private room at hotel without mother. Pt reports she spent the last of her money paying for the motel for her mother and children.  Pt has contact : Jamal Bradnax reached out to pt friend and left VM Addiontional resources provided to patient. SW will cont to follow up  [name], [place]

## 2020-09-07 NOTE — Progress Notes (Signed)
RT placed patient on CPAP HS. NO O2 bleed in needed. Patient tolerating well at this time.  

## 2020-09-07 NOTE — Progress Notes (Signed)
Physical Therapy Weekly Progress Note  Patient Details  Name: Heather [name] MRN: [id number] Date of Birth: 05/02/1975  Beginning of progress report period: Sep 01, 2020 End of progress report period: September 07, 2020  Today's Date: 09/07/2020 PT Individual Time: 1425-1500 PT Individual Time Calculation (min): 35 min   Patient has met 4 of 4 short term goals.  Pt currently min A-CGA for all bed mob, transfers, gait with Rolling walker. Pt profoundly limited 2/2 body habitus, endurance, and strength and requires frequent rest breaks to recover from all activity. Pt also has had multiple times during stay where she has reported stressors related to social history and DC planning needs.   Patient continues to demonstrate the following deficits muscle weakness, decreased cardiorespiratoy endurance and decreased standing balance and decreased balance strategies and therefore will continue to benefit from skilled PT intervention to increase functional independence with mobility.  Patient progressing toward long term goals..  Continue plan of care.  PT Short Term Goals Week 1:  PT Short Term Goal 1 (Week 1): pt to demonstrate supine<>sit min A consistently PT Short Term Goal 1 - Progress (Week 1): Met PT Short Term Goal 2 (Week 1): pt to demonstrate sit<>stand transfer with LRAD mod A x1 consistently PT Short Term Goal 2 - Progress (Week 1): Met PT Short Term Goal 3 (Week 1): pt to demonstrate SPT with LRAD mod A x1 consistently PT Short Term Goal 3 - Progress (Week 1): Met PT Short Term Goal 4 (Week 1): pt to ambulate with LRAD 10' mod A x1 PT Short Term Goal 4 - Progress (Week 1): Met Week 2:  PT Short Term Goal 1 (Week 2): pt to demonstrate transfers CGA with LRAD PT Short Term Goal 2 (Week 2): pt to demonstrate gait 25' CGA with RW PT Short Term Goal 3 (Week 2): pt to stand at least 1 min with or without UE support at CGA  Skilled Therapeutic Interventions/Progress Updates:    pt received  in bed with visitor present and pt reported she needed to discuss her "situation" with her friend prior to therapy. Pt missed time with therapy for this and once friend left PT entered room to begin. Pt reported she felt too fatigued to stand but agreeable to come to EOB at CGA. Pt directed in seated BLE and BUE strengthening exercises x10 overhead clap, wide clap, punches, LAQ, marching. Pt required rest breaks throughout 2/2 fatigue. Pt directed in one Sit to stand to Rolling walker CGA then requested to return to supine at end of session and declined to attempt gait training or transferring to WC. Pt returned to supine, CGA for LE placement to bed and supervision for upward scooting in bed. Pt left in bed,  All needs in reach and in good condition. Call light in hand.    Prior to therapy session, PT present for brief family conference with OT and social work present. Pt and pt's mother present and educated on pt's current status with PT. Pt's mother reports despite no physical assistance required at this time required for pt's mobility with Rolling walker with PT that pt's mother would be unable to provide assistance for hygiene at home. Pt's mother declined adamantly about this and reports she was unable to assist pt at home.  Pt then left with mother and social worker.   Therapy Documentation Precautions:  Precautions Precautions: Fall Precaution Comments: reports R knee buckling in standing, 2 previous recent falls. 3 in the past 6   months, skin breakdown in periareas Restrictions Weight Bearing Restrictions: No General:   Vital Signs: Therapy Vitals Temp: 97.9 F (36.6 C) Pulse Rate: 86 Resp: 18 BP: (!) 106/52 Patient Position (if appropriate): Lying Oxygen Therapy SpO2: 96 % Pain:   Vision/Perception     Mobility:   Locomotion :    Trunk/Postural Assessment :    Balance:   Exercises:   Other Treatments:     Therapy/Group: Individual Therapy  [name] 09/07/2020,  7:53 AM

## 2020-09-07 NOTE — Progress Notes (Signed)
Patient ID: Heather [name], female   DOB: 03-08-1976, 45 y.o.   MRN: [id number]   Received a call from pt friend/pastor: Heather [name]. Pt has been keeping him updated but was unaware of motel situation with pt mother.   Fannie Knee reports that pt [name] was taking care of her mother [name] previously for years and just doesn't understand why the mother would not want to provide care to the patient especially with just hygiene.    Fannie Knee reports he is going to make some calls and make an attempt to work out something to assist patient with motel assistance to get a separate from from the mother. He will be in discussion with friends about resources this weekend and requested sw to give him a call on Monday to follow up.  [place], [place]

## 2020-09-07 NOTE — Progress Notes (Signed)
Occupational Therapy Session Note  Patient Details  Name: Heather [name] MRN: [id number] Date of Birth: January 22, 1976  Today's Date: 09/07/2020 OT Individual Time: 1000-1009 OT Individual Time Calculation (min): 9 min    Short Term Goals: Week 2:  OT Short Term Goal 1 (Week 2): Pt will complete a short distance ambulatory BSC transfer using LRAD and Min A OT Short Term Goal 2 (Week 2): Pt will complete perihygiene with no more than Min A using adaptive strategies/AE as needed OT Short Term Goal 3 (Week 2): Pt will complete 1 grooming task while standing at the sink using bari walker to increase standing endurance  Skilled Therapeutic Interventions/Progress Updates:    Pt in bed to start session with her mom present in discussion with SW.  Pt was agreeable to transfer OOB and was able to transition to sitting with min guard assist using the rails for support.  She then worked on donning her gripper socks with use of the wide sockaide with min assist for setup only.  She was able donn them once they were placed on the sockaide with setup assist.  Transfer from the elevated bed to the wheelchair was completed at min assist with pt's lighter bariatric walker from home.  She was then taken down to the dayroom for rest of session.  Had her work on sit to stand and standing with use of the walker while engaged in Wii activity.  She was only able to stand one time secondary to increased fear as well as increased right knee pain for approximately 2 mins.  She needed total assist +2 (pt 75%) to complete standing secondary to not being able to push up from the wheelchair with one hand and hold the walker.  She instead had to hold the walker with both hands, which was the way she was doing with the heavy bariatric walker in her room.  This is not safe with her current lighter bariatric walker, so she will need increased practice.  Pt declined trying to complete standing any more than just one time.  She  continued to complete Wii activity in sitting before returning to the room where she was left sitting up with the call button and phone in reach.    Therapy Documentation Precautions:  Precautions Precautions: Fall Precaution Comments: reports R knee buckling in standing, 2 previous recent falls. 3 in the past 6 months, skin breakdown in periareas Restrictions Weight Bearing Restrictions: No  Pain: Pain Assessment Pain Scale: 0-10 Pain Score: 0-No pain Faces Pain Scale: Hurts a little bit Pain Type: Acute pain Pain Location: Knee Pain Orientation: Right Pain Descriptors / Indicators: Discomfort Pain Onset: With Activity Pain Intervention(s): Repositioned ADL: See Care Tool Section for some details of mobility and selfcare  Therapy/Group: Individual Therapy  Kelce Bouton OTR/L 09/07/2020, 12:18 PM

## 2020-09-07 NOTE — Progress Notes (Signed)
Physical Therapy Session Note  Patient Details  Name: Heather [name] MRN: [id number] Date of Birth: 1975-11-12  Today's Date: 09/07/2020 PT Individual Time: 1120-1200 PT Individual Time Calculation (min): 40 min   Short Term Goals: Week 1:  PT Short Term Goal 1 (Week 1): pt to demonstrate supine<>sit min A consistently PT Short Term Goal 2 (Week 1): pt to demonstrate sit<>stand transfer with LRAD mod A x1 consistently PT Short Term Goal 3 (Week 1): pt to demonstrate SPT with LRAD mod A x1 consistently PT Short Term Goal 4 (Week 1): pt to ambulate with LRAD 10' mod A x1  Skilled Therapeutic Interventions/Progress Updates:    Pt received seated in w/c in room, agreeable to PT session. Pt feeling emotional following family meeting prior to this session. Pt with flat affect but reports being upset by news of lack of d/c plan at this point. Provided emotional support and encouragement to patient. Pt declines any standing or gait this session, agreeable to therex. Seated BLE strengthening therex x 10-15 reps each: marches, LAQ, ankle pumps. Pt then reports urge to use the bathroom. Sit to stand and stand pivot transfer with RW and min A. Pt able to continently void on BSC, see Flowsheet for details. Pt able to assist initially with pericare but ultimately requires max to total A for thoroughness. Pt returned to bed for pericare and due to patient request due to fatigue. Sit to supine Supervision with use of bedrails. Pt is dependent for remainder of pericare at bed level. Pt left supine in bed with needs in reach at end of session.  Therapy Documentation Precautions:  Precautions Precautions: Fall Precaution Comments: reports R knee buckling in standing, 2 previous recent falls. 3 in the past 6 months, skin breakdown in periareas Restrictions Weight Bearing Restrictions: No    Therapy/Group: Individual Therapy   Excell Seltzer, PT, DPT, CSRS  09/07/2020, 12:22 PM

## 2020-09-08 NOTE — Progress Notes (Signed)
Occupational Therapy Session Note  Patient Details  Name: Heather [name] MRN: [id number] Date of Birth: 1976/01/24  Today's Date: 09/08/2020 OT Individual Time: 1435-1530 OT Individual Time Calculation (min): 55 min    Skilled Therapeutic Interventions/Progress Updates:    Pt greeted in bed, reporting that her Rt knee was hurting and she was unable to stand during previous PT session. We called for RN at start of session to provide some pain medicine for relief. Pt refused to attempt standing during session due to pain, asking to defer laundry activity. Agreeable to sit EOB to engage in exercises with encouragement. Setup for supine<sit given increased time and max effort from pt. Used the small step to improve sitting support while she was guided through UB exercises using 3# dumbbells x10 reps each exercise, pt able to engage bilaterally without LOBs, which is an improvement from session last week with this therapist. Pt then requested to lie back down due to fatigue. Bed placed in trendelenburg and pt able to boost herself up with increased time. She then engaged in modified bicycles 30 sec x2 sets and also overhead arm circles for 1 minute holds. Pt needed several rest breaks during participation. UB stretches to relieve muscular tension completed including forward/backward shoulder rolls, shoulder shrugs, scapular pinches, shoulder retraction, lateral cervical rotation, and wrist rolls. Tx focus today was placed on general strengthening to maximize independence during self care tasks and functional transfers. At end of session pt remained in bed with all needs within reach and 4 bedrails up.   Therapy Documentation Precautions:  Precautions Precautions: Fall Precaution Comments: reports R knee buckling in standing, 2 previous recent falls. 3 in the past 6 months, skin breakdown in periareas Restrictions Weight Bearing Restrictions: No Vital Signs: Therapy Vitals Temp: 98.1 F (36.7  C) Pulse Rate: (!) 101 Resp: 17 BP: 125/73 Patient Position (if appropriate): Lying Oxygen Therapy SpO2: 95 % O2 Device: Room Air Pain: Pain Assessment Pain Scale: 0-10 Pain Score: 8  Pain Type: Acute pain Pain Location: Knee Pain Orientation: Right Pain Descriptors / Indicators: Aching Pain Frequency: Intermittent Pain Onset: With Activity Pain Intervention(s): Medication (See eMAR) (tramadol given) ADL: ADL Grooming: Setup Where Assessed-Grooming: Edge of bed Upper Body Bathing: Moderate assistance Where Assessed-Upper Body Bathing: Edge of bed Lower Body Bathing: Other (comment) (+2 assist) Where Assessed-Lower Body Bathing: Edge of bed,Bed level Upper Body Dressing: Moderate assistance Where Assessed-Upper Body Dressing: Edge of bed,Bed level Lower Body Dressing: Dependent Where Assessed-Lower Body Dressing: Bed level Toileting: Not assessed Toilet Transfer: Not assessed Tub/Shower Transfer: Not assessed      Therapy/Group: Individual Therapy  Camila Maita A Chele Cornell 09/08/2020, 3:49 PM

## 2020-09-08 NOTE — Progress Notes (Signed)
Occupational Therapy Session Note  Patient Details  Name: Heather [name] MRN: [id number] Date of Birth: July 05, 1975  Today's Date: 09/08/2020 OT Individual Time: 8588-5027 OT Individual Time Calculation (min): 55 min   Skilled Therapeutic Interventions/Progress Updates:    Pt greeted in bed with no c/o pain, just finishing up with receiving morning medicine from RN. She requested to use the toilet in bathroom vs BSC. When explained to pt that we needed to put the bariatric BSC over the toilet for safety, she opted to just use the bariatric BSC in the room. Setup for bed mobility given increased time, bed deflated once she reached EOB to increase safety with sit<stands using the bariatric RW she will have post d/c. Supervision/cues for using the sock aide to don gripper socks, pt using the small step at this time to increase sitting balance safety. [place] supervision for sit<stand with heavy reliance on the bedrails for power up. [place] supervision for stand pivot<BSC where pt was able to self manage her nightgown with cuing. She had B+B void, able to complete perihygiene with Mod A in the back only, pt able to assist without use of toilet tongs, reports toilet tongs need to be longer for them to be helpful functionally. While seated on BSC, she doffed her nightgown and then donned a clean one afterwards with setup. Supervision for stand pivot<bed where pt needed supine rest after boosting herself up in bed. She was then able to spread her legs to complete frontal perihygiene. OT had laid out a towel beneath her in case there was dripping urine, pt asking OT to keep towel positioned where it was vs removing it as it made her feel better if she had an incontinent episode. Pt also asking OT to throw away 1 nightgown that was soiled with vomit (from ~2 weeks ago). We made plans to launder two bags of her clothes during PM session together. Pt appreciative. She remained in bed at [place] of session, all needs  within reach and 4 bedrails up of air mattress. Session focus placed on ADL retraining, functional transfers, sit<stands, and standing balance.   Therapy Documentation Precautions:  Precautions Precautions: Fall Precaution Comments: reports R knee buckling in standing, 2 previous recent falls. 3 in the past 6 months, skin breakdown in periareas Restrictions Weight Bearing Restrictions: No ADL: ADL Grooming: Setup Where Assessed-Grooming: Edge of bed Upper Body Bathing: Moderate assistance Where Assessed-Upper Body Bathing: Edge of bed Lower Body Bathing: Other (comment) (+2 assist) Where Assessed-Lower Body Bathing: Edge of bed,Bed level Upper Body Dressing: Moderate assistance Where Assessed-Upper Body Dressing: Edge of bed,Bed level Lower Body Dressing: Dependent Where Assessed-Lower Body Dressing: Bed level Toileting: Not assessed Toilet Transfer: Not assessed Tub/Shower Transfer: Not assessed     Therapy/Group: Individual Therapy  Lauramae Kneisley A Shelvie Salsberry 09/08/2020, 12:19 PM

## 2020-09-08 NOTE — Progress Notes (Signed)
PROGRESS NOTE   Subjective/Complaints:  Pt reports very angry and sad that her mother won't take her with her to motel- She also thinks her mother is the cause of twin 43 yr old daughters being taken away by foster care.   Asking if there's a way to get CNA services after d/c.  Doristine Bosworth told her he might be able to pay for a motel for her- "her pastor".    ROS:   Pt denies SOB, abd pain, CP, N/V/C/D, and vision changes     Objective:   No results found. No results for input(s): WBC, HGB, HCT, PLT in the last 72 hours. Recent Labs    09/07/20 0451  NA 135  K 4.2  CL 99  CO2 28  GLUCOSE 92  BUN 18  CREATININE 1.40*  CALCIUM 9.5    Intake/Output Summary (Last 24 hours) at 09/08/2020 0809 Last data filed at 09/07/2020 1814 Gross per 24 hour  Intake 1320 ml  Output --  Net 1320 ml        Physical Exam: Vital Signs Blood pressure (!) 110/58, pulse 84, temperature 97.7 F (36.5 C), temperature source Oral, resp. rate 18, height 5\' 3"  (1.6 m), weight (!) 203.8 kg, SpO2 96 %.      General: awake, alert, appropriate, sitting up in low air loss mattress/bariatric bed;  NAD HENT: conjugate gaze; oropharynx moist; edentulous CV: regular rate; no JVD Pulmonary: CTA B/L; no W/R/R- good air movement GI: soft, NT, ND, (+)BS; BMI 79 Psychiatric: appropriate but appears very flat- doesn't look upset, but says she's upset (appropriately) about foster care taking daughters Neurological: alert Skin:   Wounds under pannus- interdry in place-still some weeping- no change Musc: no obvious edema; also VERY TTP over R patella- esp with moving patella slightly. No heat or no erythema Motor: Bilateral upper extremities: 4+/5 proximal distal Bilateral lower extremities: Hip flexion, knee extension 4/5, ankle dorsiflexion 5/5    Assessment/Plan: 1. Functional deficits which require 3+ hours per day of interdisciplinary therapy in  a comprehensive inpatient rehab setting.  Physiatrist is providing [place] team supervision and 24 hour management of active medical problems listed below.  Physiatrist and rehab team continue to assess barriers to discharge/monitor patient progress toward functional and medical goals  Care Tool:  Bathing    Body parts bathed by patient: Right arm,Left arm,Chest,Abdomen,Right upper leg,Left upper leg,Right lower leg,Face,Left lower leg   Body parts bathed by helper: Right lower leg Body parts n/a: Front perineal area,Buttocks   Bathing assist Assist Level: Moderate Assistance - Patient 50 - 74%     Upper Body Dressing/Undressing Upper body dressing   What is the patient wearing?: Dress    Upper body assist Assist Level: Minimal Assistance - Patient > 75%    Lower Body Dressing/Undressing Lower body dressing      What is the patient wearing?: Pants     Lower body assist Assist for lower body dressing: Moderate Assistance - Patient 50 - 74%     Toileting Toileting    Toileting assist Assist for toileting: Maximal Assistance - Patient 25 - 49%     Transfers Chair/bed transfer  Transfers assist  Chair/bed transfer activity did not occur: Safety/medical concerns  Chair/bed transfer assist level: Minimal Assistance - Patient > 75% Chair/bed transfer assistive device: Programmer, multimedia   Ambulation assist   Ambulation activity did not occur: Safety/medical concerns  Assist level: 2 helpers (CGA & +2 w/c follow) Assistive device: Walker-rolling Max distance: 39ft   Walk 10 feet activity   Assist  Walk 10 feet activity did not occur: Safety/medical concerns  Assist level: 2 helpers (CGA & +2 w/c follow) Assistive device: Walker-rolling   Walk 50 feet activity   Assist Walk 50 feet with 2 turns activity did not occur: Safety/medical concerns  Assist level: 2 helpers (CGA & +2 w/c follow) Assistive device: Walker-rolling    Walk 150 feet  activity   Assist Walk 150 feet activity did not occur: Safety/medical concerns         Walk 10 feet on uneven surface  activity   Assist Walk 10 feet on uneven surfaces activity did not occur: Safety/medical concerns         Wheelchair     Assist Will patient use wheelchair at discharge?: Yes (Per PT long term goals) Type of Wheelchair: Manual Wheelchair activity did not occur: Safety/medical concerns         Wheelchair 50 feet with 2 turns activity    Assist    Wheelchair 50 feet with 2 turns activity did not occur: Safety/medical concerns       Wheelchair 150 feet activity     Assist  Wheelchair 150 feet activity did not occur: Safety/medical concerns       Blood pressure (!) 110/58, pulse 84, temperature 97.7 F (36.5 C), temperature source Oral, resp. rate 18, height 5\' 3"  (1.6 m), weight (!) 203.8 kg, SpO2 96 %.   Medical Problem List and Plan: 1.Debilitysecondary topanniculitis and SIRS  -con't PT and OT- walked 60 ft and total 135 ft yesterday- FYI  -con't PT and OT-   2. Antithrombotics: -DVT/anticoagulation:Pharmaceutical:Lovenox -antiplatelet therapy: N/A 3.Chronic back pain/Pain Management:Used Mobic and robaxin. Continue tylenol prn  5/30- denies pain- con't tylenol prn- NO NSAIDs per renal  6/2- having new onset R knee pain. Doesn't appear to be gout based on clinical exam- will try Tramadol 50 mg q6 hours prn for pain  6/3- pt didn't mention knee pain today? Didn't try tramadol? 4. Mood:LCSW to follow for evaluation and support. -antipsychotic agents: N/A 5. Neuropsych: This patientiscapable of making decisions on herown behalf. 6. Skin/Wound Care:Routine pressure relief measures. --Continue Silvadene and Interdry per WOC recommendations.  --is Malnourished with albumin 2.1.  Added collagen and protein to promote wound healing --Add vitamin C, Zinc  and multivitamin as supplements.   6/2- Albumin up to 2.4!- con't regimen 7. Fluids/Electrolytes/Nutrition:Monitor I/O.  8. Acute on chronic renal failure with urinary retention:   Creatinine 1.47 on 5/20, labs ordered for Monday  5/23- Cr up to 1.86- will encourage lfuids and recheck Wednesday  5/25- Cr up to 2.14- will start IVFs 75cc/hr NS and recheck in AM- if not much better, will start Foley since likely post obstructive nephropathy, esp since cath volume after voiding was 450cc-  5/26- will place foley and add Flomax to get pt to void   5/29 BUN/Cr demonstrating improvement. appreciate nephrology consult for AKI, continue per recs   -continue foley   5/30- will d/c foley but recheck labs Thursday to see if retaining/Cr up?- d/c IVFs Cr down to 1.51  66/2- Cr down to 1.40- so likely no  post obstructive nephropathy.  9. Hyponatremia: Will continue to monitor. Question dilutional   Sodium 133 on 5/20, labs ordered for Monday    Na up to 137- con't to monitor  6/2- Na 135- con't regimen 10. Anemia of chronic diease: Monitor for signs of bleeding. H/H has been 7-8 range.   Hemoglobin 8.7 on 5/20  5/27- Hb 8.2- con't ot monitor  Continue to monitor 11. COPD/OHS/OSA: Has declinedCPAP use. Encourage pulmonary hygeine. On Singulair.  5/31- refusing CPAP! Encouraged her to use.  6/3- pt actually wore CPAP last night per documentation  12. Schizoaffective bipolar d/o: Continue Wellbutrin with cymbalta and buspar.  6/3- on max dose of Duloxetine for mood; don't want to change meds due to complicated dx.  13. RLS: On Requip. 63. Super super obesity: Educate on appropriate diet, self care and weight loss to help promote mobility and overall health.  5/23- weight down some to 204.6 kg- con't hospital diet.   5/31- will have staff weigh patient on stnading scale with therapy and document- order placed.  6/1- Weight down 1 kg to 203.8 kg from 204. 9 kg- in 1 week.   15. Chronic Insomnia:  Seroquel started a month ago-caused dizziness and ineffective  5/20- will try trazodone 25 mg q8pm  5/24- sleeping much better- con't regimen  Appears to be improving 16. Renal mass: Was set to see MD at [facility] urology--had toberescheduled. 17. Panniculitis- with open weeping wounds- will con't WOC recs with interdry and wound care- might need some Diflucan to help with yeast overgrowth.   Diflucan x 1 week (5/26)  5/30- no significant change- con't regimen 18. Dizziness/lightheadedness-   Improving 19. ?Edema?  5/23- I don't see actual edema on exam- tissues soft, nonpitting- will wait on any diuretics, esp because Cr up to 1.86 20. Pt asked for COVID vaccine- will give today  5/24- received vaccine- has nausea- no body aches- will see if from COVID vaccine? 21. Constipation  5/23- will add Senokot 2 tabs daily-   6/2- going daily now- encouraged pt to ask for miralax if gets constipated with tramadol.  22. Nausea-   5/24- will check KUB and also give compazine prn- has very quiet bowel sounds.   5/25- KUB (-)  5/31- resolved 23. UTI  5/25- not sure which came first- UTI and retention or retention causing UTI- anyway, starting Bactrim since allergic to PCN- anaphylaxis- also IVFs NS 75cc/hr and will recheck labs in AM   5/26- con't IVFs 75cc/hour and push fluids PO- also adding Flomax to get pt to void hopefully- and add foley since retaining- due to body habitus, nursing cannot cath regularly.  5/27-29- Bactrim changed to fosfomycin per Pharmacy- was resistant to bactrim- WBC down to 6.4k --avoid bactrim per nephrology recs  5/30- resolved.  5/31- foley out and drinking well  24. Asthma  5/26- will satrt Pulmicort middle dose BID and albuterol inhalers prn per pt's home meds  5/27- d/w nursing- need to make sure doesn't miss pulmicort 25. Losing voice  5/26- will monitor- not painful- could be from allergies combined with vomiting affecting vocal cords?  5/30- is back to  normal/resolved 26. Children taken away from her  6/1- will see if neuropsych vs SW can talk to her today.   6/3- scheduled to see Neuropysch today  27. Dispo  6/3- will see if possible to get CNA care after d/c?   LOS: 15 days A FACE TO FACE EVALUATION WAS PERFORMED  Margit Batte 09/08/2020, 8:09  AM

## 2020-09-08 NOTE — Progress Notes (Signed)
Patient refused CPAP for the night  

## 2020-09-08 NOTE — Progress Notes (Signed)
Physical Therapy Session Note  Patient Details  Name: Heather [name] MRN: [id number] Date of Birth: 1975/05/16  Today's Date: 09/08/2020 PT Individual Time: 1300-1415 PT Individual Time Calculation (min): 75 min   Short Term Goals: Week 1:  PT Short Term Goal 1 (Week 1): pt to demonstrate supine<>sit min A consistently PT Short Term Goal 1 - Progress (Week 1): Met PT Short Term Goal 2 (Week 1): pt to demonstrate sit<>stand transfer with LRAD mod A x1 consistently PT Short Term Goal 2 - Progress (Week 1): Met PT Short Term Goal 3 (Week 1): pt to demonstrate SPT with LRAD mod A x1 consistently PT Short Term Goal 3 - Progress (Week 1): Met PT Short Term Goal 4 (Week 1): pt to ambulate with LRAD 10' mod A x1 PT Short Term Goal 4 - Progress (Week 1): Met Week 2:  PT Short Term Goal 1 (Week 2): pt to demonstrate transfers CGA with LRAD PT Short Term Goal 2 (Week 2): pt to demonstrate gait 25' CGA with RW PT Short Term Goal 3 (Week 2): pt to stand at least 1 min with or without UE support at [facility]  Skilled Therapeutic Interventions/Progress Updates:    pt received in bed and agreeable to therapy. Pt directed in supine>sit supervision with extra time, to come to full sitting in good position. Pt directed in Sit to stand from [facility] to Rolling walker at [facility] however limited 2/2 R knee pain and reported unable to attempt gait training. Pt returned to sitting, directed in additional Sit to stand however pt then unable to achieve full standing. Pt then reported she needed to lay down 2/2 pain. Pt CGA for this and took rest break in supine for brief period. Pt then directed in supine>sit again CGA, seated exercises with 3# hand weights x10: bicep curls, overhead press, chest press. Pt then reported she needed to lay down to rest 2/2 fatigue and CGA to do this. Pt then directed in supine exercises 2x10: ankle pumps, heel slides, hip abduction/adduction, leg lifts, cross body punches, alternating cross body  punch with opposite knee/hip flexion, modified partial crunches to clear head from bed. Pt required frequent rest breaks during session,educated on importance of continued activity at DC, pt's body habitus being barrier for mobility, and techniques to continue improving activity levels. Pt agreeable to recommendations and ideas for home and reports being motivated to improve and be more I. Pt left in bed, All needs in reach and in good condition. Call light in hand.    Therapy Documentation Precautions:  Precautions Precautions: Fall Precaution Comments: reports R knee buckling in standing, 2 previous recent falls. 3 in the past 6 months, skin breakdown in periareas Restrictions Weight Bearing Restrictions: No General:   Vital Signs: Therapy Vitals Temp: 98.1 F (36.7 C) Pulse Rate: (!) 101 Resp: 17 BP: 125/73 Patient Position (if appropriate): Lying Oxygen Therapy SpO2: 95 % O2 Device: Room Air Pain: Pain Assessment Pain Scale: 0-10 Pain Score: 8  Pain Type: Acute pain Pain Location: Knee Pain Orientation: Right Pain Descriptors / Indicators: Aching Pain Frequency: Intermittent Pain Onset: With Activity Pain Intervention(s): Medication (See eMAR) (tramadol given) Mobility:   Locomotion :    Trunk/Postural Assessment :    Balance:   Exercises:   Other Treatments:      Therapy/Group: Individual Therapy  [name] 09/08/2020, 3:39 PM

## 2020-09-09 NOTE — Progress Notes (Signed)
RT note. Patient refuse cpap for the night, Cpap is in patients room if she changes her mind

## 2020-09-09 NOTE — Progress Notes (Signed)
Occupational Therapy Session Note  Patient Details  Name: Heather [name] MRN: [id number] Date of Birth: March 31, 1976  Today's Date: 09/09/2020 OT Individual Time: 1103-1200 and 1400-1427 OT Individual Time Calculation (min): 57 min and 27 min   Skilled Therapeutic Interventions/Progress Updates:    Pt greeted in bed, reporting feeling a little depressed due to circumstances with finances/family. She did not want to get OOB or do laundry today. Agreeable to have to hair washed to cheer her up and then engage in bathing/dressing tasks. Min A for supine<sit with significant effort from pt to perform. Provided small step for balance support EOB while OT washed her hair using the hair washing tray. Pt combed hair while OT assisted with blow-drying. Pt then used the reacher to remove her gripper socks before washing her feet with LH sponge. Educated her on using the reacher + towel to dry her feet after. Setup for UB self care including donning gown. She refused to don gripper socks as she wanted to return to bed. [place] supervision for returning to supine and for boosting up in bed with bed placed in trendelenburg position. Tried having pt spread her legs wide enough to perform frontal pericare with HOB elevated however unable to thoroughly complete on her own due to body habitus. Pt wishing to defer posterior hygiene due to nursing completing prior to therapy. At end of session pt remained comfortably in bed, all needs within reach and 4 bedrails up. Tx focus placed on sitting balance and adaptive self care skills.    2nd Session 1:1 tx (27 min) Pt greeted in bed with no c/o pain. Tx completed bed level for time mgt. With [facility] elevated, tried to engage pt in core exercises involving anterior reaching to targets however this was too challenging for her. Therefore activity changed to UB strengthening using 4.4lb ball, pt completing lateral rotations, chest presses, overhead raises, and forward/backward rows  x10 reps 2 sets. Pt required significant rest breaks in between exercises due to fatigue. At end of session pt remained comfortably in bed, all needs within reach and 4 bedrails ups.  We listened to [name] music during exercises today to address psychosocial health  Therapy Documentation Precautions:  Precautions Precautions: Fall Precaution Comments: reports R knee buckling in standing, 2 previous recent falls. 3 in the past 6 months, skin breakdown in periareas Restrictions Weight Bearing Restrictions: No Pain: no c/o pain during tx Pain Assessment Pain Scale: 0-10 Pain Score: 8  Pain Type: Acute pain Pain Location: Knee Pain Orientation: Right Pain Onset: Gradual Pain Intervention(s): Medication (See eMAR) ADL: ADL Grooming: Setup Where Assessed-Grooming: Edge of bed Upper Body Bathing: Moderate assistance Where Assessed-Upper Body Bathing: Edge of bed Lower Body Bathing: Other (comment) (+2 assist) Where Assessed-Lower Body Bathing: Edge of bed,Bed level Upper Body Dressing: Moderate assistance Where Assessed-Upper Body Dressing: Edge of bed,Bed level Lower Body Dressing: Dependent Where Assessed-Lower Body Dressing: Bed level Toileting: Not assessed Toilet Transfer: Not assessed Tub/Shower Transfer: Not assessed      Therapy/Group: Individual Therapy  Heather [name] A Damaso Laday 09/09/2020, 12:47 PM

## 2020-09-09 NOTE — Plan of Care (Signed)
  Problem: Consults Goal: RH GENERAL PATIENT EDUCATION Description: See Patient Education module for education specifics. Outcome: Progressing Goal: Skin Care Protocol Initiated - if Braden Score 18 or less Description: If consults are not indicated, leave blank or document N/A Outcome: Progressing Goal: Diabetes Guidelines if Diabetic/Glucose > 140 Description: If diabetic or lab glucose is > 140 mg/dl - Initiate Diabetes/Hyperglycemia Guidelines & Document Interventions  Outcome: Progressing   Problem: RH BOWEL ELIMINATION Goal: RH STG MANAGE BOWEL WITH ASSISTANCE Description: STG Manage Bowel with Mod I Assistance. Outcome: Progressing Goal: RH STG MANAGE BOWEL W/MEDICATION W/ASSISTANCE Description: STG Manage Bowel with Medication with Mod I Assistance. Outcome: Progressing   Problem: RH BLADDER ELIMINATION Goal: RH STG MANAGE BLADDER WITH ASSISTANCE Description: STG Manage Bladder With Mod I Assistance Outcome: Progressing Goal: RH STG MANAGE BLADDER WITH MEDICATION WITH ASSISTANCE Description: STG Manage Bladder With Medication With Mod I Assistance. Outcome: Progressing   Problem: RH SKIN INTEGRITY Goal: RH STG SKIN FREE OF INFECTION/BREAKDOWN Description: Skin to remain free from additional breakdown and infection while on rehab with Mod I assistance. Outcome: Progressing Goal: RH STG MAINTAIN SKIN INTEGRITY WITH ASSISTANCE Description: STG Maintain Skin Integrity With Mod I Assistance. Outcome: Progressing Goal: RH STG ABLE TO PERFORM INCISION/WOUND CARE W/ASSISTANCE Description: STG Able To Perform Incision/Wound Care With Mod I Assistance. Outcome: Progressing   Problem: RH SAFETY Goal: RH STG ADHERE TO SAFETY PRECAUTIONS W/ASSISTANCE/DEVICE Description: STG Adhere to Safety Precautions With cues and reminders. Outcome: Progressing Goal: RH STG DECREASED RISK OF FALL WITH ASSISTANCE Description: STG Decreased Risk of Fall With Mod I Assistance. Outcome:  Progressing   Problem: RH PAIN MANAGEMENT Goal: RH STG PAIN MANAGED AT OR BELOW PT'S PAIN GOAL Description: < 4 on a 0-10 pain scale. Outcome: Progressing   Problem: RH KNOWLEDGE DEFICIT GENERAL Goal: RH STG INCREASE KNOWLEDGE OF SELF CARE AFTER HOSPITALIZATION Description: Patient will be able to demonstrate knowledge of medication management, pain management, skin/wound care management with educational materials and handouts provided by staff, at discharge independently. Outcome: Progressing

## 2020-09-09 NOTE — Progress Notes (Signed)
PROGRESS NOTE   Subjective/Complaints: Anxious and tearful regarding social issues  ROS:   Pt denies SOB, abd pain, CP, N/V/C/D, and vision changes     Objective:   No results found. No results for input(s): WBC, HGB, HCT, PLT in the last 72 hours. Recent Labs    09/07/20 0451  NA 135  K 4.2  CL 99  CO2 28  GLUCOSE 92  BUN 18  CREATININE 1.40*  CALCIUM 9.5    Intake/Output Summary (Last 24 hours) at 09/09/2020 1628 Last data filed at 09/09/2020 1300 Gross per 24 hour  Intake 1080 ml  Output 600 ml  Net 480 ml        Physical Exam: Vital Signs Blood pressure (!) 123/58, pulse 100, temperature 98.2 F (36.8 C), temperature source Oral, resp. rate 18, height 5\' 3"  (1.6 m), weight (!) 203.8 kg, SpO2 93 %. Gen: no distress, normal appearing HEENT: oral mucosa pink and moist, NCAT Cardio: Reg rate Chest: normal effort, normal rate of breathing Abd: soft, non-distended Ext: no edema Psych: anxious and tearful regarding social issues Neurological: alert Skin:   Wounds under pannus- interdry in place-still some weeping- no change Musc: no obvious edema; also VERY TTP over R patella- esp with moving patella slightly. No heat or no erythema Motor: Bilateral upper extremities: 4+/5 proximal distal Bilateral lower extremities: Hip flexion, knee extension 4/5, ankle dorsiflexion 5/5    Assessment/Plan: 1. Functional deficits which require 3+ hours per day of interdisciplinary therapy in a comprehensive inpatient rehab setting.  Physiatrist is providing [place] team supervision and 24 hour management of active medical problems listed below.  Physiatrist and rehab team continue to assess barriers to discharge/monitor patient progress toward functional and medical goals  Care Tool:  Bathing    Body parts bathed by patient: Right arm,Left arm,Chest,Abdomen,Right upper leg,Left upper leg,Right lower leg,Face,Left  lower leg   Body parts bathed by helper: Right lower leg Body parts n/a: Front perineal area,Buttocks   Bathing assist Assist Level: Moderate Assistance - Patient 50 - 74%     Upper Body Dressing/Undressing Upper body dressing   What is the patient wearing?: Dress    Upper body assist Assist Level: Set up assist    Lower Body Dressing/Undressing Lower body dressing      What is the patient wearing?: Pants     Lower body assist Assist for lower body dressing: Moderate Assistance - Patient 50 - 74%     Toileting Toileting    Toileting assist Assist for toileting: Minimal Assistance - Patient > 75%     Transfers Chair/bed transfer  Transfers assist  Chair/bed transfer activity did not occur: Safety/medical concerns  Chair/bed transfer assist level: Minimal Assistance - Patient > 75% Chair/bed transfer assistive device: Programmer, multimedia   Ambulation assist   Ambulation activity did not occur: Safety/medical concerns  Assist level: 2 helpers (CGA & +2 w/c follow) Assistive device: Walker-rolling Max distance: 54ft   Walk 10 feet activity   Assist  Walk 10 feet activity did not occur: Safety/medical concerns  Assist level: 2 helpers (CGA & +2 w/c follow) Assistive device: Walker-rolling   Walk 50 feet activity  Assist Walk 50 feet with 2 turns activity did not occur: Safety/medical concerns  Assist level: 2 helpers (CGA & +2 w/c follow) Assistive device: Walker-rolling    Walk 150 feet activity   Assist Walk 150 feet activity did not occur: Safety/medical concerns         Walk 10 feet on uneven surface  activity   Assist Walk 10 feet on uneven surfaces activity did not occur: Safety/medical concerns         Wheelchair     Assist Will patient use wheelchair at discharge?: Yes (Per PT long term goals) Type of Wheelchair: Manual Wheelchair activity did not occur: Safety/medical concerns         Wheelchair 50  feet with 2 turns activity    Assist    Wheelchair 50 feet with 2 turns activity did not occur: Safety/medical concerns       Wheelchair 150 feet activity     Assist  Wheelchair 150 feet activity did not occur: Safety/medical concerns       Blood pressure (!) 123/58, pulse 100, temperature 98.2 F (36.8 C), temperature source Oral, resp. rate 18, height 5\' 3"  (1.6 m), weight (!) 203.8 kg, SpO2 93 %.   Medical Problem List and Plan: 1.Debilitysecondary topanniculitis and SIRS  -continue PT and OT- walked 60 ft and total 135 ft yesterday- FYI 2. Antithrombotics: -DVT/anticoagulation:Pharmaceutical:Lovenox -antiplatelet therapy: N/A 3.Chronic back pain/Pain Management:Used Mobic and robaxin. Continue tylenol prn  5/30- denies pain- con't tylenol prn- NO NSAIDs per renal  6/2- having new onset R knee pain. Doesn't appear to be gout based on clinical exam- will try Tramadol 50 mg q6 hours prn for pain  6/3- pt didn't mention knee pain today? Didn't try tramadol?  6/4: continue current regimen.  4. Mood:LCSW to follow for evaluation and support. -antipsychotic agents: N/A  -provided encouragement, discussed trying to focus on positives in her life given severe depression/anxiety over her social issues/home environment 5. Neuropsych: This patientiscapable of making decisions on herown behalf. 6. Skin/Wound Care:Routine pressure relief measures. --Continue Silvadene and Interdry per WOC recommendations.  --is Malnourished with albumin 2.1.  Added collagen and protein to promote wound healing --Add vitamin C, Zinc and multivitamin as supplements.   6/2- Albumin up to 2.4!- con't regimen 7. Fluids/Electrolytes/Nutrition:Monitor I/O.  8. Acute on chronic renal failure with urinary retention:   Creatinine 1.47 on 5/20, labs ordered for Monday  5/23- Cr up to 1.86- will encourage lfuids and recheck  Wednesday  5/25- Cr up to 2.14- will start IVFs 75cc/hr NS and recheck in AM- if not much better, will start Foley since likely post obstructive nephropathy, esp since cath volume after voiding was 450cc-  5/26- will place foley and add Flomax to get pt to void   5/29 BUN/Cr demonstrating improvement. appreciate nephrology consult for AKI, continue per recs   -continue foley   5/30- will d/c foley but recheck labs Thursday to see if retaining/Cr up?- d/c IVFs Cr down to 1.51  66/2- Cr down to 1.40- so likely no post obstructive nephropathy.  9. Hyponatremia: Will continue to monitor. Question dilutional   Sodium 133 on 5/20, labs ordered for Monday    Na up to 137- con't to monitor  6/2- Na 135- con't regimen 10. Anemia of chronic diease: Monitor for signs of bleeding. H/H has been 7-8 range.   Hemoglobin 8.7 on 5/20  5/27- Hb 8.2- con't ot monitor  Continue to monitor 11. COPD/OHS/OSA: Has declinedCPAP use. Encourage pulmonary hygeine.  On Singulair.  5/31- refusing CPAP! Encouraged her to use.  6/3- pt actually wore CPAP last night per documentation  12. Schizoaffective bipolar d/o: Continue Wellbutrin with cymbalta and buspar.  6/3- on max dose of Duloxetine for mood; don't want to change meds due to complicated dx.  13. RLS: On Requip. 21. Super super obesity: Educate on appropriate diet, self care and weight loss to help promote mobility and overall health.  5/23- weight down some to 204.6 kg- con't hospital diet.   5/31- will have staff weigh patient on stnading scale with therapy and document- order placed.  6/1- Weight down 1 kg to 203.8 kg from 204. 9 kg- in 1 week.   15. Chronic Insomnia: Seroquel started a month ago-caused dizziness and ineffective  5/20- will try trazodone 25 mg q8pm  5/24- sleeping much better- con't regimen  Appears to be improving 16. Renal mass: Was set to see MD at [facility]rology--had toberescheduled. 17. Panniculitis- with open weeping wounds- will  con't WOC recs with interdry and wound care- might need some Diflucan to help with yeast overgrowth.   Diflucan x 1 week (5/26)  5/30- no significant change- con't regimen 18. Dizziness/lightheadedness-   Improving 19. ?Edema?  5/23- I don't see actual edema on exam- tissues soft, nonpitting- will wait on any diuretics, esp because Cr up to 1.86 20. Pt asked for COVID vaccine- will give today  5/24- received vaccine- has nausea- no body aches- will see if from COVID vaccine? 21. Constipation  5/23- will add Senokot 2 tabs daily-   6/2- going daily now- encouraged pt to ask for miralax if gets constipated with tramadol.  22. Nausea-   5/24- will check KUB and also give compazine prn- has very quiet bowel sounds.   5/25- KUB (-)  5/31- resolved 23. UTI  5/25- not sure which came first- UTI and retention or retention causing UTI- anyway, starting Bactrim since allergic to PCN- anaphylaxis- also IVFs NS 75cc/hr and will recheck labs in AM   5/26- con't IVFs 75cc/hour and push fluids PO- also adding Flomax to get pt to void hopefully- and add foley since retaining- due to body habitus, nursing cannot cath regularly.  5/27-29- Bactrim changed to fosfomycin per Pharmacy- was resistant to bactrim- WBC down to 6.4k --avoid bactrim per nephrology recs  5/30- resolved.  5/31- foley out and drinking well  24. Asthma  5/26- will satrt Pulmicort middle dose BID and albuterol inhalers prn per pt's home meds  5/27- d/w nursing- need to make sure doesn't miss pulmicort 25. Losing voice  5/26- will monitor- not painful- could be from allergies combined with vomiting affecting vocal cords?  5/30- is back to normal/resolved 26. Children taken away from her  6/1- will see if neuropsych vs SW can talk to her today.   6/3- scheduled to see Neuropysch today  27. Dispo  6/3- will see if possible to get CNA care after d/c?   LOS: 16 days A FACE TO [place] 09/09/2020, 4:28 PM

## 2020-09-10 NOTE — Progress Notes (Signed)
PROGRESS NOTE   Subjective/Complaints: In better mood today About to work with Heather [name].  Asks about getting 2nd COVID vaccine, but discussed she will be discharged before she is due for 2nd shot  ROS:   Pt denies SOB, abd pain, CP, N/V/C/D, and vision changes     Objective:   No results found. No results for input(s): WBC, HGB, HCT, PLT in the last 72 hours. No results for input(s): NA, K, CL, CO2, GLUCOSE, BUN, CREATININE, CALCIUM in the last 72 hours.  Intake/Output Summary (Last 24 hours) at 09/10/2020 1224 Last data filed at 09/10/2020 0742 Gross per 24 hour  Intake 1660 ml  Output --  Net 1660 ml        Physical Exam: Vital Signs Blood pressure (!) 108/55, pulse 80, temperature 97.8 F (36.6 C), resp. rate 20, height 5\' 3"  (1.6 m), weight (!) 203.8 kg, SpO2 94 %. Gen: no distress, normal appearing HEENT: oral mucosa pink and moist, NCAT Cardio: Reg rate Chest: normal effort, normal rate of breathing Abd: soft, non-distended Ext: no edema Psych: anxious and tearful regarding social issues Neurological: alert Skin:   Wounds under pannus- interdry in place-still some weeping- no change Musc: no obvious edema; also VERY TTP over R patella- esp with moving patella slightly. No heat or no erythema Motor: Bilateral upper extremities: 4+/5 proximal distal Bilateral lower extremities: Hip flexion, knee extension 4/5, ankle dorsiflexion 5/5    Assessment/Plan: 1. Functional deficits which require 3+ hours per day of interdisciplinary therapy in a comprehensive inpatient rehab setting.  Physiatrist is providing [place] team supervision and 24 hour management of active medical problems listed below.  Physiatrist and rehab team continue to assess barriers to discharge/monitor patient progress toward functional and medical goals  Care Tool:  Bathing    Body parts bathed by patient: Right arm,Left  arm,Chest,Abdomen,Right upper leg,Left upper leg,Right lower leg,Face,Left lower leg   Body parts bathed by helper: Right lower leg Body parts n/a: Front perineal area,Buttocks   Bathing assist Assist Level: Moderate Assistance - Patient 50 - 74%     Upper Body Dressing/Undressing Upper body dressing   What is the patient wearing?: Dress    Upper body assist Assist Level: Set up assist    Lower Body Dressing/Undressing Lower body dressing      What is the patient wearing?: Pants     Lower body assist Assist for lower body dressing: Moderate Assistance - Patient 50 - 74%     Toileting Toileting    Toileting assist Assist for toileting: Minimal Assistance - Patient > 75%     Transfers Chair/bed transfer  Transfers assist  Chair/bed transfer activity did not occur: Safety/medical concerns  Chair/bed transfer assist level: Minimal Assistance - Patient > 75% Chair/bed transfer assistive device: Programmer, multimedia   Ambulation assist   Ambulation activity did not occur: Safety/medical concerns  Assist level: 2 helpers (CGA & +2 w/c follow) Assistive device: Walker-rolling Max distance: 31ft   Walk 10 feet activity   Assist  Walk 10 feet activity did not occur: Safety/medical concerns  Assist level: 2 helpers (CGA & +2 w/c follow) Assistive device: Walker-rolling   Walk  50 feet activity   Assist Walk 50 feet with 2 turns activity did not occur: Safety/medical concerns  Assist level: 2 helpers (CGA & +2 w/c follow) Assistive device: Walker-rolling    Walk 150 feet activity   Assist Walk 150 feet activity did not occur: Safety/medical concerns         Walk 10 feet on uneven surface  activity   Assist Walk 10 feet on uneven surfaces activity did not occur: Safety/medical concerns         Wheelchair     Assist Will patient use wheelchair at discharge?: Yes (Per PT long term goals) Type of Wheelchair: Manual Wheelchair  activity did not occur: Safety/medical concerns         Wheelchair 50 feet with 2 turns activity    Assist    Wheelchair 50 feet with 2 turns activity did not occur: Safety/medical concerns       Wheelchair 150 feet activity     Assist  Wheelchair 150 feet activity did not occur: Safety/medical concerns       Blood pressure (!) 108/55, pulse 80, temperature 97.8 F (36.6 C), resp. rate 20, height 5\' 3"  (1.6 m), weight (!) 203.8 kg, SpO2 94 %.   Medical Problem List and Plan: 1.Debilitysecondary topanniculitis and SIRS  -Continue PT and OT 2. Antithrombotics: -DVT/anticoagulation:Pharmaceutical:Lovenox -antiplatelet therapy: N/A 3.Chronic back pain/Pain Management:Used Mobic and robaxin. Continue tylenol prn  5/30- denies pain- con't tylenol prn- NO NSAIDs per renal  6/2- having new onset R knee pain. Doesn't appear to be gout based on clinical exam- will try Tramadol 50 mg q6 hours prn for pain  6/3- pt didn't mention knee pain today? Didn't try tramadol?  6/4-6/5: continue current regimen.  4. Mood:LCSW to follow for evaluation and support. -antipsychotic agents: N/A  -provided encouragement, discussed trying to focus on positives in her life given severe depression/anxiety over her social issues/home environment 5. Neuropsych: This patientiscapable of making decisions on herown behalf. 6. Skin/Wound Care:Routine pressure relief measures. --Continue Silvadene and Interdry per WOC recommendations.  --is Malnourished with albumin 2.1.  Added collagen and protein to promote wound healing --Add vitamin C, Zinc and multivitamin as supplements.   6/2- Albumin up to 2.4!- continue regimen 7. Fluids/Electrolytes/Nutrition:Monitor I/O.  8. Acute on chronic renal failure with urinary retention:   Creatinine 1.47 on 5/20, labs ordered for Monday  5/23- Cr up to 1.86- will encourage lfuids and  recheck Wednesday  5/25- Cr up to 2.14- will start IVFs 75cc/hr NS and recheck in AM- if not much better, will start Foley since likely post obstructive nephropathy, esp since cath volume after voiding was 450cc-  5/26- will place foley and add Flomax to get pt to void   5/29 BUN/Cr demonstrating improvement. appreciate nephrology consult for AKI, continue per recs   -continue foley   5/30- will d/c foley but recheck labs Thursday to see if retaining/Cr up?- d/c IVFs Cr down to 1.51  66/2- Cr down to 1.40- so likely no post obstructive nephropathy.  9. Hyponatremia: Will continue to monitor. Question dilutional   Sodium 133 on 5/20, labs ordered for Monday    Na up to 137- con't to monitor  6/2- Na 135- con't regimen 10. Anemia of chronic diease: Monitor for signs of bleeding. H/H has been 7-8 range.   Hemoglobin 8.7 on 5/20  5/27- Hb 8.2- con't ot monitor, repeat Monday 11. COPD/OHS/OSA: Has declinedCPAP use. Encourage pulmonary hygeine. On Singulair.  5/31- refusing CPAP! Encouraged her to  use.  6/3- pt actually wore CPAP last night per documentation  12. Schizoaffective bipolar d/o: Continue Wellbutrin with cymbalta and buspar.  6/3- on max dose of Duloxetine for mood; don't want to change meds due to complicated dx.  13. RLS: On Requip. 66. Super super obesity: Educate on appropriate diet, self care and weight loss to help promote mobility and overall health.  5/23- weight down some to 204.6 kg- con't hospital diet.   5/31- will have staff weigh patient on stnading scale with therapy and document- order placed.  6/1- Weight down 1 kg to 203.8 kg from 204. 9 kg- in 1 week.   15. Chronic Insomnia: Seroquel started a month ago-caused dizziness and ineffective  5/20- will try trazodone 25 mg q8pm  5/24- sleeping much better- con't regimen  Appears to be improving 16. Renal mass: Was set to see MD at [facility] urology--had toberescheduled. 17. Panniculitis- with open weeping wounds- will  con't WOC recs with interdry and wound care- might need some Diflucan to help with yeast overgrowth.   Diflucan x 1 week (5/26)  5/30- no significant change- con't regimen 18. Dizziness/lightheadedness-   Improving 19. ?Edema?  5/23- I don't see actual edema on exam- tissues soft, nonpitting- will wait on any diuretics, esp because Cr up to 1.86 20. Pt asked for COVID vaccine- will give today  5/24- received vaccine- has nausea- no body aches- will see if from COVID vaccine? 21. Constipation  5/23- will add Senokot 2 tabs daily-   6/2- going daily now- encouraged pt to ask for miralax if gets constipated with tramadol.  22. Nausea-   5/24- will check KUB and also give compazine prn- has very quiet bowel sounds.   5/25- KUB (-)  5/31- resolved 23. UTI  5/25- not sure which came first- UTI and retention or retention causing UTI- anyway, starting Bactrim since allergic to PCN- anaphylaxis- also IVFs NS 75cc/hr and will recheck labs in AM   5/26- con't IVFs 75cc/hour and push fluids PO- also adding Flomax to get pt to void hopefully- and add foley since retaining- due to body habitus, nursing cannot cath regularly.  5/27-29- Bactrim changed to fosfomycin per Pharmacy- was resistant to bactrim- WBC down to 6.4k --avoid bactrim per nephrology recs  5/30- resolved.  5/31- foley out and drinking well  24. Asthma  5/26- will satrt Pulmicort middle dose BID and albuterol inhalers prn per pt's home meds  5/27- d/w nursing- need to make sure doesn't miss pulmicort 25. Losing voice  5/26- will monitor- not painful- could be from allergies combined with vomiting affecting vocal cords?  5/30- is back to normal/resolved 26. Children taken away from her  6/1- will see if neuropsych vs SW can talk to her today.   6/3- scheduled to see Neuropysch today  27. Dispo  6/3- will see if possible to get CNA care after d/c?   LOS: 17 days A FACE TO [place] 09/10/2020, 12:24 PM

## 2020-09-10 NOTE — Progress Notes (Signed)
Occupational Therapy Session Note  Patient Details  Name: Arnitra Sokoloski Guard MRN: [id number] Date of Birth: 1975/07/02  Today's Date: 09/10/2020 OT Individual Time: 0900-0957 OT Individual Time Calculation (min): 57 min   Skilled Therapeutic Interventions/Progress Updates:    Pt greeted in bed with no c/o pain, just feeling "hopeless" about her d/c situation and the future in general. OT educated pt on evidence-based concept of mindfulness in regards to emotional coping, anxiety mgt, and stress reduction. Pt agreeable to review a mindfulness handout that OT printed out for her. Discussed at length functional implications in regards to present moment awareness, mindful breathing, mindful acceptance, and mindful movement to increase pts feelings of self efficacy in regards to her therapeutic recovery and maximize therapy participation at CIR. Pt unwilling to sit up for education at start of session but agreeable within 30 minutes of start of tx. Increased time and vcs for implementing mindful movement strategies during supine<sit. Used a small step for balance support while pt finished the self reflection portion of her handout. Pt required encouragement and cuing to complete. At end of session pt left in care of RN for [facility] transfer.  Therapy Documentation Precautions:  Precautions Precautions: Fall Precaution Comments: reports R knee buckling in standing, 2 previous recent falls. 3 in the past 6 months, skin breakdown in periareas Restrictions Weight Bearing Restrictions: No ADL: ADL Grooming: Setup Where Assessed-Grooming: Edge of bed Upper Body Bathing: Moderate assistance Where Assessed-Upper Body Bathing: Edge of bed Lower Body Bathing: Other (comment) (+2 assist) Where Assessed-Lower Body Bathing: Edge of bed,Bed level Upper Body Dressing: Moderate assistance Where Assessed-Upper Body Dressing: Edge of bed,Bed level Lower Body Dressing: Dependent Where Assessed-Lower Body Dressing:  Bed level Toileting: Not assessed Toilet Transfer: Not assessed Tub/Shower Transfer: Not assessed      Therapy/Group: Individual Therapy  Jerlyn Pain A Phuong Moffatt 09/10/2020, 12:36 PM

## 2020-09-10 NOTE — Progress Notes (Signed)
Patient refused CPAP for the night. Sp02=94% on Room [employer]

## 2020-09-10 NOTE — Progress Notes (Signed)
Physical Therapy Session Note  Patient Details  Name: Heather [name] MRN: [id number] Date of Birth: 11/03/1975  Today's Date: 09/10/2020 PT Individual Time: 1420-1502 PT Individual Time Calculation (min): 42 min   Short Term Goals: Week 2:  PT Short Term Goal 1 (Week 2): pt to demonstrate transfers CGA with LRAD PT Short Term Goal 2 (Week 2): pt to demonstrate gait 25' CGA with RW PT Short Term Goal 3 (Week 2): pt to stand at least 1 min with or without UE support at [facility]  Skilled Therapeutic Interventions/Progress Updates:     Patient in bed upon PT arrival. Patient alert and agreeable to PT session. Patient reported 7/10 R knee pain during session, RN made aware. PT provided repositioning, rest breaks, and distraction as pain interventions throughout session. Patient reporting fear of falling with mobility due to R knee pain and very concerned about buckling today.   Assessed patient's L knee, patient reports hx of knee pain and possible dx of OA. Reports shooting pain at lateral joint line of her R knee with N/T from the back of her knee to the plantar surface of her foot. Patient with limited knee flexion due to pain and positive McMurry test reproducing patient's pain, no change in NT. R hip cleared. Patient with significant increased lumbar lordosis with reports of hx of spinal stenosis. Pain and N/T with palpation and joint mobilization in side-lying over sacrum and L4-5 region, no pain along any other spinal segments. Responded positively to flexion positioning with double knee to chest with max A to achieve position in supine. Patient also reports urinary and bowl incontinence intermittently PTA and since admission.    Educated patient on findings based on assessment. Recommending assessment for knee brace for improved activity tolerance, will discuss with lead PT and focus on hip and thigh strengthening manage R knee pain at this time. Recommended focus on spinal flexion positions for  pain management and pelvic floor and core strengthening for back pain with N/T. Patient performed heel slides into single knee to chest with ADIM (abdominal drawing in maneuver) R/L x10 and supine alteranting marching with ADIM 2x3.  Patient sat EOB x1 with min A +2 for safety/balance with use of bed rail. Patient unable to sit evenly EOB due to air mattress and very uncomfortable, requesting to return to lying. Patient returned to supine with min A of 1 person for lower extremity management. Provided cues for bringing knees to chest to lift her legs onto the bed. Patient performed rolling R/L with min-mod A +2 for positioning with use of bed rail and scooting up in bed with supervision for cues for bridging and use of upper extremities.   Patient in bed at end of session with breaks locked, bed alarm set, and all needs within reach. Discussed concerns with current seating options to promote OOB mobility. Patient reports she is uncomfortable in the w/c, noted w/c with slung seat and ill-fitting cushion. Will discuss bariatric seating options with lead therapist to promote sitting OOB and increased sitting tolerance.    Therapy Documentation Precautions:  Precautions Precautions: Fall Precaution Comments: reports R knee buckling in standing, 2 previous recent falls. 3 in the past 6 months, skin breakdown in periareas Restrictions Weight Bearing Restrictions: No   Therapy/Group: Individual Therapy  Louise Victory L Dione Mccombie PT, DPT  09/10/2020, 4:45 PM

## 2020-09-10 NOTE — Progress Notes (Signed)
Physical Therapy Session Note  Patient Details  Name: Heather [name] MRN: [id number] Date of Birth: 1975/09/16  Today's Date: 09/10/2020 PT Individual Time: 1330-1400 PT Individual Time Calculation (min): 30 min   Short Term Goals: Week 1:  PT Short Term Goal 1 (Week 1): pt to demonstrate supine<>sit min A consistently PT Short Term Goal 1 - Progress (Week 1): Met PT Short Term Goal 2 (Week 1): pt to demonstrate sit<>stand transfer with LRAD mod A x1 consistently PT Short Term Goal 2 - Progress (Week 1): Met PT Short Term Goal 3 (Week 1): pt to demonstrate SPT with LRAD mod A x1 consistently PT Short Term Goal 3 - Progress (Week 1): Met PT Short Term Goal 4 (Week 1): pt to ambulate with LRAD 10' mod A x1 PT Short Term Goal 4 - Progress (Week 1): Met Week 2:  PT Short Term Goal 1 (Week 2): pt to demonstrate transfers CGA with LRAD PT Short Term Goal 2 (Week 2): pt to demonstrate gait 25' CGA with RW PT Short Term Goal 3 (Week 2): pt to stand at least 1 min with or without UE support at [place] Interventions/Progress Updates:    Pt presents in bed, recalling her session earlier this AM with Herschel [place] stating it was helpful. Pt expresses her overall concerns with upcoming d/c and emotional support and encouragement provided. Agreeable to get dressed in preparation for next therapy session. Sorted through clothing at bed level to obtain outfit. CGA to come to EOB using bed rail for support and momentum for technique. Placed step under feet for BLE support with floor. Donned shirt with supervision EOB and some assist needed with threading of pants (able to do L but required assist with R). Attempted x 3 reps sit > stand with bariatric RW but unable to achieve full clearance to get into standing. Pt stating due to pain in R knee when weightbearing. Returned to supine with mod assist and scooted up in bed with trendelenberg position. Rolling with min assist using rails and total  assist to pull up pants at bed level.   Therapy Documentation Precautions:  Precautions Precautions: Fall Precaution Comments: reports R knee buckling in standing, 2 previous recent falls. 3 in the past 6 months, skin breakdown in periareas Restrictions Weight Bearing Restrictions: No Pain: 8/10 pain in R knee with attempts for weightbearing. RN aware and medication offered.    Therapy/Group: Individual Therapy  Canary Brim Ivory Broad, PT, DPT, CBIS  09/10/2020, 2:06 PM

## 2020-09-10 NOTE — Plan of Care (Signed)
  Problem: Consults Goal: RH GENERAL PATIENT EDUCATION Description: See Patient Education module for education specifics. Outcome: Progressing Goal: Skin Care Protocol Initiated - if Braden Score 18 or less Description: If consults are not indicated, leave blank or document N/A Outcome: Progressing Goal: Diabetes Guidelines if Diabetic/Glucose > 140 Description: If diabetic or lab glucose is > 140 mg/dl - Initiate Diabetes/Hyperglycemia Guidelines & Document Interventions  Outcome: Progressing   Problem: RH BOWEL ELIMINATION Goal: RH STG MANAGE BOWEL WITH ASSISTANCE Description: STG Manage Bowel with Mod I Assistance. Outcome: Progressing Goal: RH STG MANAGE BOWEL W/MEDICATION W/ASSISTANCE Description: STG Manage Bowel with Medication with Mod I Assistance. Outcome: Progressing   Problem: RH BLADDER ELIMINATION Goal: RH STG MANAGE BLADDER WITH ASSISTANCE Description: STG Manage Bladder With Mod I Assistance Outcome: Progressing Goal: RH STG MANAGE BLADDER WITH MEDICATION WITH ASSISTANCE Description: STG Manage Bladder With Medication With Mod I Assistance. Outcome: Progressing   Problem: RH SKIN INTEGRITY Goal: RH STG SKIN FREE OF INFECTION/BREAKDOWN Description: Skin to remain free from additional breakdown and infection while on rehab with Mod I assistance. Outcome: Progressing Goal: RH STG MAINTAIN SKIN INTEGRITY WITH ASSISTANCE Description: STG Maintain Skin Integrity With Mod I Assistance. Outcome: Progressing Goal: RH STG ABLE TO PERFORM INCISION/WOUND CARE W/ASSISTANCE Description: STG Able To Perform Incision/Wound Care With Mod I Assistance. Outcome: Progressing   Problem: RH SAFETY Goal: RH STG ADHERE TO SAFETY PRECAUTIONS W/ASSISTANCE/DEVICE Description: STG Adhere to Safety Precautions With cues and reminders. Outcome: Progressing Goal: RH STG DECREASED RISK OF FALL WITH ASSISTANCE Description: STG Decreased Risk of Fall With Mod I Assistance. Outcome:  Progressing   Problem: RH PAIN MANAGEMENT Goal: RH STG PAIN MANAGED AT OR BELOW PT'S PAIN GOAL Description: < 4 on a 0-10 pain scale. Outcome: Progressing   Problem: RH KNOWLEDGE DEFICIT GENERAL Goal: RH STG INCREASE KNOWLEDGE OF SELF CARE AFTER HOSPITALIZATION Description: Patient will be able to demonstrate knowledge of medication management, pain management, skin/wound care management with educational materials and handouts provided by staff, at discharge independently. Outcome: Progressing

## 2020-09-11 ENCOUNTER — Inpatient Hospital Stay (HOSPITAL_COMMUNITY): Payer: Medicaid Other

## 2020-09-11 LAB — CBC
HCT: 34.1 % — ABNORMAL LOW (ref 36.0–46.0)
Hemoglobin: 9.7 g/dL — ABNORMAL LOW (ref 12.0–15.0)
MCH: 23.7 pg — ABNORMAL LOW (ref 26.0–34.0)
MCHC: 28.4 g/dL — ABNORMAL LOW (ref 30.0–36.0)
MCV: 83.2 fL (ref 80.0–100.0)
Platelets: 488 10*3/uL — ABNORMAL HIGH (ref 150–400)
RBC: 4.1 MIL/uL (ref 3.87–5.11)
RDW: 26.3 % — ABNORMAL HIGH (ref 11.5–15.5)
WBC: 11.8 10*3/uL — ABNORMAL HIGH (ref 4.0–10.5)
nRBC: 0 % (ref 0.0–0.2)

## 2020-09-11 LAB — BASIC METABOLIC PANEL
Anion gap: 9 (ref 5–15)
BUN: 21 mg/dL — ABNORMAL HIGH (ref 6–20)
CO2: 27 mmol/L (ref 22–32)
Calcium: 9.5 mg/dL (ref 8.9–10.3)
Chloride: 103 mmol/L (ref 98–111)
Creatinine, Ser: 1.47 mg/dL — ABNORMAL HIGH (ref 0.44–1.00)
GFR, Estimated: 45 mL/min — ABNORMAL LOW (ref >60)
Glucose, Bld: 91 mg/dL (ref 70–99)
Potassium: 4.1 mmol/L (ref 3.5–5.1)
Sodium: 139 mmol/L (ref 135–145)

## 2020-09-11 MED ORDER — BUPIVACAINE HCL (PF) 0.25 % IJ SOLN
INTRAMUSCULAR | Status: AC
  Filled 2020-09-11: qty 30

## 2020-09-11 MED ORDER — DICLOFENAC SODIUM 1 % EX GEL
4.0000 g | Freq: Four times a day (QID) | CUTANEOUS | Status: DC
  Administered 2020-09-11 – 2020-09-14 (×5): 4 g via TOPICAL
  Filled 2020-09-11 (×2): qty 100

## 2020-09-11 MED ORDER — SILVER SULFADIAZINE 1 % EX CREA
TOPICAL_CREAM | Freq: Two times a day (BID) | CUTANEOUS | Status: DC
  Filled 2020-09-11: qty 85

## 2020-09-11 MED ORDER — METHYLPREDNISOLONE ACETATE 80 MG/ML IJ SUSP
INTRAMUSCULAR | Status: AC
  Filled 2020-09-11: qty 1

## 2020-09-11 NOTE — Progress Notes (Signed)
Patient ID: Heather [name], female   DOB: 20-Oct-1975, 45 y.o.   MRN: [id number]   SW spoke with pt friend/pastor [facility] [place]). Still working with some resources today. Reports potential plan for patient to stay in motel intermittently, covered by him and other resources.   He is also in the process of assisting her with finding a place.    Friend reports he will give SW a call this evening to confirm hotel.  [place], [place]

## 2020-09-11 NOTE — Progress Notes (Addendum)
Physical Therapy Session Note  Patient Details  Name: Heather [name] MRN: [id number] Date of Birth: 1976/03/24  Today's Date: 09/11/2020 PT Individual Time: 1300-1315 PT Individual Time Calculation (min): 15 min   Short Term Goals: Week 1:  PT Short Term Goal 1 (Week 1): pt to demonstrate supine<>sit min A consistently PT Short Term Goal 1 - Progress (Week 1): Met PT Short Term Goal 2 (Week 1): pt to demonstrate sit<>stand transfer with LRAD mod A x1 consistently PT Short Term Goal 2 - Progress (Week 1): Met PT Short Term Goal 3 (Week 1): pt to demonstrate SPT with LRAD mod A x1 consistently PT Short Term Goal 3 - Progress (Week 1): Met PT Short Term Goal 4 (Week 1): pt to ambulate with LRAD 10' mod A x1 PT Short Term Goal 4 - Progress (Week 1): Met Week 2:  PT Short Term Goal 1 (Week 2): pt to demonstrate transfers CGA with LRAD PT Short Term Goal 2 (Week 2): pt to demonstrate gait 25' CGA with RW PT Short Term Goal 3 (Week 2): pt to stand at least 1 min with or without UE support at [facility]  Skilled Therapeutic Interventions/Progress Updates:    pt received in bed and agreeable to therapy. Pt and PT discussed pt's current R knee pain and pt reporting she feels this is limiting her ability to stand. Pt also expressed concerns with WC comfort in sitting and PT educated pt that we can try different options to attempt to make WC more comfortable to improve tolerance OOB. Pt agreeable and directed in transferring to EOB, CGA however mid way through this NCT present to take pt's vitals and then imagine tech present to take pt imaging. Pt left with transport to be taken for imaging in good condition.    Therapy Documentation Precautions:  Precautions Precautions: Fall Precaution Comments: reports R knee buckling in standing, 2 previous recent falls. 3 in the past 6 months, skin breakdown in periareas Restrictions Weight Bearing Restrictions: No General: PT Amount of Missed Time (min): 60  Minutes PT Missed Treatment Reason: Unavailable (Comment) (pt being taken for swallow study) Vital Signs: Therapy Vitals Temp: 98.3 F (36.8 C) Pulse Rate: 97 Resp: 15 BP: (!) 133/58 Patient Position (if appropriate): Lying Oxygen Therapy SpO2: 95 % O2 Device: Room Air Pain:   Mobility:   Locomotion :    Trunk/Postural Assessment :    Balance:   Exercises:   Other Treatments:      Therapy/Group: Individual Therapy  [name] 09/11/2020, 1:17 PM

## 2020-09-11 NOTE — Progress Notes (Signed)
Orthopedic Tech Progress Note Patient Details:  Heather [name] [birth year] [id number] Called in order to [facility] for an ROM BRACE Patient ID: Heather [name], female   DOB: Dec 01, 1975, 45 y.o.   MRN: [id number]   Janit Pagan 09/11/2020, 10:16 AM

## 2020-09-11 NOTE — Progress Notes (Signed)
PROGRESS NOTE   Subjective/Complaints:  Pt reports R knee hurting-= therapy told her she needs a knee brace.  Got Lovenox in arm- and has severe bruising in L upper arm around elbow.  Also c/o numbness in her feet.   Also C/O TTP low back.  Took tramadol- helps some.  Also asking for voltaren get for R knee pain.   ROS:   Pt denies SOB, abd pain, CP, N/V/C/D, and vision changes     Objective:   No results found. Recent Labs    09/11/20 0621  WBC 11.8*  HGB 9.7*  HCT 34.1*  PLT 488*   Recent Labs    09/11/20 0621  NA 139  K 4.1  CL 103  CO2 27  GLUCOSE 91  BUN 21*  CREATININE 1.47*  CALCIUM 9.5    Intake/Output Summary (Last 24 hours) at 09/11/2020 0852 Last data filed at 09/11/2020 0731 Gross per 24 hour  Intake 2180 ml  Output --  Net 2180 ml        Physical Exam: Vital Signs Blood pressure 112/65, pulse 86, temperature (!) 97.5 F (36.4 C), temperature source Oral, resp. rate 20, height 5\' 3"  (1.6 m), weight (!) 203.8 kg, SpO2 95 %. Gen: no distress, normal appearing    General: awake, alert, appropriate, on low air loss bariatric mattress- BMI 79.57; NAD HENT: conjugate gaze; oropharynx moist CV: regular rate; no JVD Pulmonary: CTA B/L; no W/R/R- good air movement; hypoventilation;  GI: soft, NT, ND, (+)BS Psychiatric: irritable Neurological: alert Skin:   Wounds under pannus- interdry in place-still some weeping- no change; bruising and knot in LUE above elbow- from lovenox inj.  Musc: no obvious edema; also VERY TTP over R patella- esp with moving patella slightly. No heat or no erythema- still TTP Motor: Bilateral upper extremities: 4+/5 proximal distal Bilateral lower extremities: Hip flexion, knee extension 4/5, ankle dorsiflexion 5/5    Assessment/Plan: 1. Functional deficits which require 3+ hours per day of interdisciplinary therapy in a comprehensive inpatient rehab  setting.  Physiatrist is providing [place] team supervision and 24 hour management of active medical problems listed below.  Physiatrist and rehab team continue to assess barriers to discharge/monitor patient progress toward functional and medical goals  Care Tool:  Bathing    Body parts bathed by patient: Right arm,Left arm,Chest,Abdomen,Right upper leg,Left upper leg,Right lower leg,Face,Left lower leg   Body parts bathed by helper: Right lower leg Body parts n/a: Front perineal area,Buttocks   Bathing assist Assist Level: Moderate Assistance - Patient 50 - 74%     Upper Body Dressing/Undressing Upper body dressing   What is the patient wearing?: Dress    Upper body assist Assist Level: Set up assist    Lower Body Dressing/Undressing Lower body dressing      What is the patient wearing?: Pants     Lower body assist Assist for lower body dressing: Moderate Assistance - Patient 50 - 74%     Toileting Toileting    Toileting assist Assist for toileting: Minimal Assistance - Patient > 75%     Transfers Chair/bed transfer  Transfers assist  Chair/bed transfer activity did not occur: Safety/medical concerns  Chair/bed transfer assist level: Minimal Assistance - Patient > 75% Chair/bed transfer assistive device: Museum/gallery exhibitions officer assist   Ambulation activity did not occur: Safety/medical concerns  Assist level: 2 helpers (CGA & +2 w/c follow) Assistive device: Walker-rolling Max distance: 21ft   Walk 10 feet activity   Assist  Walk 10 feet activity did not occur: Safety/medical concerns  Assist level: 2 helpers (CGA & +2 w/c follow) Assistive device: Walker-rolling   Walk 50 feet activity   Assist Walk 50 feet with 2 turns activity did not occur: Safety/medical concerns  Assist level: 2 helpers (CGA & +2 w/c follow) Assistive device: Walker-rolling    Walk 150 feet activity   Assist Walk 150 feet activity did not  occur: Safety/medical concerns         Walk 10 feet on uneven surface  activity   Assist Walk 10 feet on uneven surfaces activity did not occur: Safety/medical concerns         Wheelchair     Assist Will patient use wheelchair at discharge?: Yes (Per PT long term goals) Type of Wheelchair: Manual Wheelchair activity did not occur: Safety/medical concerns         Wheelchair 50 feet with 2 turns activity    Assist    Wheelchair 50 feet with 2 turns activity did not occur: Safety/medical concerns       Wheelchair 150 feet activity     Assist  Wheelchair 150 feet activity did not occur: Safety/medical concerns       Blood pressure 112/65, pulse 86, temperature (!) 97.5 F (36.4 C), temperature source Oral, resp. rate 20, height 5\' 3"  (1.6 m), weight (!) 203.8 kg, SpO2 95 %.   Medical Problem List and Plan: 1.Debilitysecondary topanniculitis and SIRS  -con't PT and OT 2. Antithrombotics: -DVT/anticoagulation:Pharmaceutical:Lovenox -antiplatelet therapy: N/A 3.Chronic back pain/Pain Management:Used Mobic and robaxin. Continue tylenol prn  5/30- denies pain- con't tylenol prn- NO NSAIDs per renal  6/2- having new onset R knee pain. Doesn't appear to be gout based on clinical exam- will try Tramadol 50 mg q6 hours prn for pain  6/3- pt didn't mention knee pain today? Didn't try tramadol?  6/6- Pain an issue- start voltaren gel QID for R knee; con't tramadol and order R knee brace?will try.  4. Mood:LCSW to follow for evaluation and support. -antipsychotic agents: N/A  -provided encouragement, discussed trying to focus on positives in her life given severe depression/anxiety over her social issues/home environment 5. Neuropsych: This patientiscapable of making decisions on herown behalf. 6. Skin/Wound Care:Routine pressure relief measures. --Continue Silvadene and Interdry per WOC recommendations.   --is Malnourished with albumin 2.1.  Added collagen and protein to promote wound healing --Add vitamin C, Zinc and multivitamin as supplements.   6/2- Albumin up to 2.4!- continue regimen 7. Fluids/Electrolytes/Nutrition:Monitor I/O.  8. Acute on chronic renal failure with urinary retention:   Creatinine 1.47 on 5/20, labs ordered for Monday  5/23- Cr up to 1.86- will encourage lfuids and recheck Wednesday  5/25- Cr up to 2.14- will start IVFs 75cc/hr NS and recheck in AM- if not much better, will start Foley since likely post obstructive nephropathy, esp since cath volume after voiding was 450cc-  5/26- will place foley and add Flomax to get pt to void   5/29 BUN/Cr demonstrating improvement. appreciate nephrology consult for AKI, continue per recs   -continue foley   5/30- will d/c foley but recheck labs Thursday to see if retaining/Cr  up?- d/c IVFs Cr down to 1.51  66/2- Cr down to 1.40- so likely no post obstructive nephropathy.   6/6- Cr up very slightly to 1.47- con't regimen 9. Hyponatremia: Will continue to monitor. Question dilutional   Sodium 133 on 5/20, labs ordered for Monday    Na up to 137- con't to monitor  6/2- Na 135- con't regimen 10. Anemia of chronic diease: Monitor for signs of bleeding. H/H has been 7-8 range.   Hemoglobin 8.7 on 5/20  5/27- Hb 8.2- con't ot monitor, repeat Monday 11. COPD/OHS/OSA: Has declinedCPAP use. Encourage pulmonary hygeine. On Singulair.  5/31- refusing CPAP! Encouraged her to use.  6/3- pt actually wore CPAP last night per documentation  12. Schizoaffective bipolar d/o: Continue Wellbutrin with cymbalta and buspar.  6/3- on max dose of Duloxetine for mood; don't want to change meds due to complicated dx.  13. RLS: On Requip. 35. Super super obesity: Educate on appropriate diet, self care and weight loss to help promote mobility and overall health.  5/23- weight down some to 204.6 kg- con't hospital diet.    5/31- will have staff weigh patient on stnading scale with therapy and document- order placed.  6/1- Weight down 1 kg to 203.8 kg from 204. 9 kg- in 1 week.   15. Chronic Insomnia: Seroquel started a month ago-caused dizziness and ineffective  5/20- will try trazodone 25 mg q8pm  5/24- sleeping much better- con't regimen  Appears to be improving 16. Renal mass: Was set to see MD at [facility] urology--had toberescheduled. 17. Panniculitis- with open weeping wounds- will con't WOC recs with interdry and wound care- might need some Diflucan to help with yeast overgrowth.   Diflucan x 1 week (5/26)  5/30- no significant change- con't regimen 18. Dizziness/lightheadedness-   Improving 19. ?Edema?  5/23- I don't see actual edema on exam- tissues soft, nonpitting- will wait on any diuretics, esp because Cr up to 1.86 20. Pt asked for COVID vaccine- will give today  5/24- received vaccine- has nausea- no body aches- will see if from COVID vaccine? 21. Constipation  5/23- will add Senokot 2 tabs daily-   6/2- going daily now- encouraged pt to ask for miralax if gets constipated with tramadol.  22. Nausea-   5/24- will check KUB and also give compazine prn- has very quiet bowel sounds.   5/25- KUB (-)  5/31- resolved 23. UTI  5/25- not sure which came first- UTI and retention or retention causing UTI- anyway, starting Bactrim since allergic to PCN- anaphylaxis- also IVFs NS 75cc/hr and will recheck labs in AM   5/26- con't IVFs 75cc/hour and push fluids PO- also adding Flomax to get pt to void hopefully- and add foley since retaining- due to body habitus, nursing cannot cath regularly.  5/27-29- Bactrim changed to fosfomycin per Pharmacy- was resistant to bactrim- WBC down to 6.4k --avoid bactrim per nephrology recs  5/30- resolved.  5/31- foley out and drinking well   6/6- pt has leukocytosis again WBC 11.8- likely has another UTI and cannot scan her bladder due to habitus, so therefore, will  check another U/A and start Macrobid chronically? 24. Asthma  5/26- will satrt Pulmicort middle dose BID and albuterol inhalers prn per pt's home meds  5/27- d/w nursing- need to make sure doesn't miss pulmicort 25. Losing voice  5/26- will monitor- not painful- could be from allergies combined with vomiting affecting vocal cords?  5/30- is back to normal/resolved 26. Children taken away from  her  6/1- will see if neuropsych vs SW can talk to her today.   6/3- scheduled to see Neuropysch today  27. Dispo  6/3- will see if possible to get CNA care after d/c?  6/6- explained to pt it's not SW job to find her a place, which she suggested; She needs to find her a place; and usually pt's out by 30 days.    LOS: 18 days A FACE TO FACE EVALUATION WAS PERFORMED  Heather [name] 09/11/2020, 8:52 AM

## 2020-09-11 NOTE — Progress Notes (Signed)
Patient ID: Heather [name], female   DOB: 16-Jul-1975, 45 y.o.   MRN: [id number]  Pt referral sent to [facility], [place]

## 2020-09-11 NOTE — Progress Notes (Signed)
Patient refused CPAP for the night. Sp02=95% on room air

## 2020-09-11 NOTE — Progress Notes (Signed)
Occupational Therapy Session Note  Patient Details  Name: Heather [name] MRN: [id number] Date of Birth: 04/19/1975  Today's Date: 09/11/2020 OT Individual Time: 8546-2703 OT Individual Time Calculation (min): 55 min    Short Term Goals: Week 2:  OT Short Term Goal 1 (Week 2): Pt will complete a short distance ambulatory BSC transfer using LRAD and Min A OT Short Term Goal 2 (Week 2): Pt will complete perihygiene with no more than Min A using adaptive strategies/AE as needed OT Short Term Goal 3 (Week 2): Pt will complete 1 grooming task while standing at the sink using bari walker to increase standing endurance   Skilled Therapeutic Interventions/Progress Updates:    Pt greeted at time of session supine in bed resting agreeable to OT session but when asked to perform activity such as sitting/standing pt hesitantly agreed. Pt stated she is fatigued despite having PT session cut short this am with radiology, stated that transfer to table made her nervous and declined Xray guided injection for R knee. Discussed DC planning, potential for going back to hotel as pastor is searching for options. Supine > sit Min A and pt needing to void, stand pivot bed <> BSC with CGA but back with Min A as she had difficulty initiating sit > stand from [facility]. Note bledsoe brace worn throughout, pt with questions regarding wearing schedule and recommended ask MD. Discussed technique for hygiene with aide, pt declining to use at this time as we have "done it before" and declined pericare. Stand pivot back to bed CGA/Min and pt returning self to supine prematurely. Positioned bed level, call bell in reach all needs met.   Therapy Documentation Precautions:  Precautions Precautions: Fall Precaution Comments: reports R knee buckling in standing, 2 previous recent falls. 3 in the past 6 months, skin breakdown in periareas Restrictions Weight Bearing Restrictions: No     Therapy/Group: Individual  Therapy  Viona Gilmore 09/11/2020, 12:31 PM

## 2020-09-11 NOTE — Progress Notes (Signed)
Patient ID: Heather [name], female   DOB: 1975-11-30, 45 y.o.   MRN: [id number]   Pt declined by [facility]. Pt referral sent to [facility]  [place], [place]

## 2020-09-11 NOTE — Progress Notes (Signed)
Occupational Therapy Session Note  Patient Details  Name: Heather [name] MRN: [id number] Date of Birth: Dec 02, 1975  Today's Date: 09/11/2020 OT Individual Time: 1287-8676 OT Individual Time Calculation (min): 55 min   Skilled Therapeutic Interventions/Progress Updates:    Pt greeted in bed, premedicated with tramadol for Rt knee pain. She declined participation in ADLs today. Therefore session focus was placed on general strengthening, activity tolerance, sitting balance, sit<stands, and sitting endurance during meaningful activity. Played upbeat music and pt completed supine<sit with setup assist given increased time (HOB raised, heavy use of the bedrails). The small step was used for balance safety while she sat EOB, engaging in UE/LE activity given encouragement to increase participation/amplitude of movement when given guided instruction. Sit<stand completed with supervision assist using the bari walker where pt swayed hips in beat to music. Able to stand for ~30 seconds before needing to sit down again. Pt then requested to lie down in bed due to knee pain. Able to boost herself up with bed placed in trendelenburg position. With HOB elevated, pt continued to participate in dancing, taking rest breaks as needed. She remained in bed at [place] of session, all needs within reach and 4 bedrails up.   Therapy Documentation Precautions:  Precautions Precautions: Fall Precaution Comments: reports R knee buckling in standing, 2 previous recent falls. 3 in the past 6 months, skin breakdown in periareas Restrictions Weight Bearing Restrictions: No ADL: ADL Grooming: Setup Where Assessed-Grooming: Edge of bed Upper Body Bathing: Moderate assistance Where Assessed-Upper Body Bathing: Edge of bed Lower Body Bathing: Other (comment) (+2 assist) Where Assessed-Lower Body Bathing: Edge of bed,Bed level Upper Body Dressing: Moderate assistance Where Assessed-Upper Body Dressing: Edge of bed,Bed  level Lower Body Dressing: Dependent Where Assessed-Lower Body Dressing: Bed level Toileting: Not assessed Toilet Transfer: Not assessed Tub/Shower Transfer: Not assessed :     Therapy/Group: Individual Therapy  Heather [name] Heather [name] 09/11/2020, 12:19 PM

## 2020-09-12 NOTE — Progress Notes (Signed)
Physical Therapy Discharge Summary  Patient Details  Name: Heather [name] MRN: [id number] Date of Birth: Oct 28, 1975  Today's Date: 09/12/2020 PT Individual Time: 1500-1530 PT Individual Time Calculation (min): 30 min    Patient has met 4 of 8 long term goals due to improved activity tolerance, improved balance, improved postural control and increased strength.  Patient to discharge at an ambulatory level Supervision.   Patient's care partner , per pt, mother will be present during the day and pt has additional assistance from memebers of her church to supervise pt at [place] location who are Independent to provide the supervision at discharge.  Reasons goals not met: pt unable to mobilize self in [facility] effectively limited with width of WC seating needs and inability to functionally mobilize WC with BLE safely with pt limited by body habitus. Pt also unable to achieve consistent distance in ambulation for gait goals at this time. Pt has shown ability on one occasion to mobilize up to 7' with Rolling walker.   Recommendation:  Patient will benefit from ongoing skilled PT services in home health setting to continue to advance safe functional mobility, address ongoing impairments in tolerance to activity, gait training, stair training, balance training, strength training, and minimize fall risk.  Equipment: Pt has Rolling walker from pervious admission and manual WC recommended for sitting and assistance required for mobility in this however with pt's need of at least a 28" WC this has been unsuccessful to acquire at this time. Additional resources are currently being requested for this equipment however pt is not I with this width of WC with her BUE unable to propel and BLE unable to propel functionally and width of WC is unable to mobilize thorough standard doorways.    Reasons for discharge: discharge from hospital  Patient/family agrees with progress made and goals achieved: Yes  PT  Discharge Precautions/Restrictions Precautions Precautions: Fall Precaution Comments: has had falls previously, new hindged knee brace to decrease risk of buckling Required Braces or Orthoses: Other Brace Other Brace: on R knee: hindged knee brace Restrictions Weight Bearing Restrictions: No Vital Signs Therapy Vitals Temp: 98 F (36.7 C) Pulse Rate: 96 Resp: 17 BP: 111/72 Patient Position (if appropriate): Lying Oxygen Therapy SpO2: 97 % O2 Device: Room Air Pain   Vision/Perception  Praxis Praxis: Intact  Cognition Overall Cognitive Status: Within Functional Limits for tasks assessed Arousal/Alertness: Awake/alert Orientation Level: Oriented X4 Attention: Focused;Sustained Focused Attention: Appears intact Sustained Attention: Appears intact Awareness: Appears intact Problem Solving: Appears intact Safety/Judgment: Appears intact Sensation Sensation Light Touch: Impaired by gross assessment (pt reports numbness/tingling sensations mostly in feet bilaterally, sometimes in hands) Coordination Gross Motor Movements are Fluid and Coordinated: No Fine Motor Movements are Fluid and Coordinated: Yes Coordination and Movement Description: Coordination impacted by body habitus Motor  Motor Motor: Other (comment) Motor - Skilled Clinical Observations: Affected by body habitus, generalized weakness  Mobility Bed Mobility Bed Mobility: Rolling Left;Supine to Sit;Sit to Supine;Scooting to [facility] Rolling Left: Independent with assistive device Supine to Sit: Supervision/Verbal cueing Sit to Supine: Supervision/Verbal cueing Scooting to HOB: Supervision/Verbal Cueing Transfers Transfers: Sit to Stand;Stand to Sit;Stand Pivot Transfers Sit to Stand: Contact Guard/Touching assist Stand to Sit: Contact Guard/Touching assist Stand Pivot Transfers: Contact Guard/Touching assist Transfer (Assistive device): Rolling walker Locomotion  Gait Ambulation: Yes Gait Assistance:  Contact Guard/Touching assist Gait Distance (Feet): 15 Feet Assistive device: Rolling walker Gait Gait: Yes Gait Pattern: Impaired Gait Pattern: Shuffle;Trunk flexed;Wide base of support;Step-through pattern;Decreased step length - right;Decreased  step length - left Gait velocity: decreased Stairs / Additional Locomotion Stairs: No Ramp: Dependent - Patient 0% Curb: Dependent - Patient 0% Wheelchair Mobility Wheelchair Mobility: Yes Wheelchair Assistance: Maximal Assistance - Patient 25 - 49% Wheelchair Propulsion: Other (comment) (pt unable to mobilize functionally with WC due to needed width of chair for UE and unable with BLE) Wheelchair Parts Management: Supervision/cueing;Needs assistance Distance: 5' in room  Trunk/Postural Assessment  Cervical Assessment Cervical Assessment: Within Functional Limits Thoracic Assessment Thoracic Assessment: Exceptions to [facility] (kyphosis) Lumbar Assessment Lumbar Assessment: Exceptions to [facility] (posterior pelvic tilt) Postural Control Postural Control: Deficits on evaluation (limited in sitting due to body habitus) Righting Reactions: decreased overall and limited 2/2 body habitus Protective Responses: decreased Postural Limitations: decreased overall and limited 2/2 body habitus  Balance Balance Balance Assessed: Yes Static Sitting Balance Static Sitting - Balance Support: Feet supported Static Sitting - Level of Assistance: 6: Modified independent (Device/Increase time) Dynamic Sitting Balance Dynamic Sitting - Balance Support: Feet supported Dynamic Sitting - Level of Assistance: 6: Modified independent (Device/Increase time) (attempting sit<stand with bari walker) Dynamic Sitting - Balance Activities: Lateral lean/weight shifting;Reaching for objects;Reaching across midline Static Standing Balance Static Standing - Balance Support: Bilateral upper extremity supported Static Standing - Level of Assistance: 5: Stand by assistance Dynamic  Standing Balance Dynamic Standing - Balance Support: Bilateral upper extremity supported (not assessed) Dynamic Standing - Level of Assistance: 5: Stand by assistance Extremity Assessment      RLE Assessment RLE Assessment: Exceptions to [facility] General Strength Comments: in sitting RLE Strength Right Hip Flexion: 3-/5 Right Hip ABduction: 3+/5 Right Hip ADduction: 3+/5 Right Knee Flexion: 3+/5 Right Knee Extension: 3/5 Right Ankle Dorsiflexion: 4/5 Right Ankle Plantar Flexion: 4/5 LLE Assessment LLE Assessment: Exceptions to [facility] General Strength Comments: in sitting LLE Strength Left Hip Flexion: 3+/5 Left Hip ABduction: 3+/5 Left Hip ADduction: 3+/5 Left Knee Flexion: 3+/5 Left Knee Extension: 3+/5 Left Ankle Dorsiflexion: 4/5 Left Ankle Plantar Flexion: 4/5    [name] 09/12/2020, 4:15 PM

## 2020-09-12 NOTE — Progress Notes (Addendum)
PROGRESS NOTE   Subjective/Complaints:  Per team, Pt refused to try and get over onto exam table for IR to do R knee injection- pt said she couldn't, but there were 5 people- per team, she c/o being scared and "wouldn't try". .   Pt does report did get R knee brace yesterday- helped pain some- got up with therapy yesterday- but admits didn't walk really far with therapy. Explained to get out of here, she will need to do so.   Asking for 2nd COVID shot. Isn't due until 6/13   ROS:   Pt denies SOB, abd pain, CP, N/V/C/D, and vision changes     Objective:   No results found. Recent Labs    09/11/20 0621  WBC 11.8*  HGB 9.7*  HCT 34.1*  PLT 488*   Recent Labs    09/11/20 0621  NA 139  K 4.1  CL 103  CO2 27  GLUCOSE 91  BUN 21*  CREATININE 1.47*  CALCIUM 9.5    Intake/Output Summary (Last 24 hours) at 09/12/2020 0906 Last data filed at 09/12/2020 0819 Gross per 24 hour  Intake 2040 ml  Output --  Net 2040 ml        Physical Exam: Vital Signs Blood pressure (!) 113/59, pulse 87, temperature 97.7 F (36.5 C), resp. rate 18, height 5\' 3"  (1.6 m), weight (!) 203.8 kg, SpO2 94 %. Gen: no distress, normal appearing      General: awake, alert, appropriate, BMI 79+; laying supine in bed; less irritable today; in low air loss bariatric mattress; NAD HENT: conjugate gaze; oropharynx moist; edentulous CV: regular rate; no JVD Pulmonary: CTA B/L; no W/R/R- good air movement GI: soft, NT, ND, (+)BS; protuberant Psychiatric: depressed/flat affect Neurological: alert Skin:   Wounds under pannus- interdry in place-still some weeping- no change; no assessed today bruising and knot in LUE above elbow- from lovenox inj.  Musc: no obvious edema; also VERY TTP over R patella- esp with moving patella slightly. No heat or no erythema- wearing R knee bledsoe knee brace- took off since had on all night.  Motor: Bilateral  upper extremities: 4+/5 proximal distal Bilateral lower extremities: Hip flexion, knee extension 4/5, ankle dorsiflexion 5/5    Assessment/Plan: 1. Functional deficits which require 3+ hours per day of interdisciplinary therapy in a comprehensive inpatient rehab setting.  Physiatrist is providing [place] team supervision and 24 hour management of active medical problems listed below.  Physiatrist and rehab team continue to assess barriers to discharge/monitor patient progress toward functional and medical goals  Care Tool:  Bathing    Body parts bathed by patient: Right arm,Left arm,Chest,Abdomen,Right upper leg,Left upper leg,Right lower leg,Face,Left lower leg   Body parts bathed by helper: Right lower leg Body parts n/a: Front perineal area,Buttocks   Bathing assist Assist Level: Moderate Assistance - Patient 50 - 74%     Upper Body Dressing/Undressing Upper body dressing   What is the patient wearing?: Dress    Upper body assist Assist Level: Set up assist    Lower Body Dressing/Undressing Lower body dressing      What is the patient wearing?: Pants     Lower body  assist Assist for lower body dressing: Moderate Assistance - Patient 50 - 74%     Toileting Toileting    Toileting assist Assist for toileting: Moderate Assistance - Patient 50 - 74%     Transfers Chair/bed transfer  Transfers assist  Chair/bed transfer activity did not occur: Safety/medical concerns  Chair/bed transfer assist level: Minimal Assistance - Patient > 75% Chair/bed transfer assistive device: Programmer, multimedia   Ambulation assist   Ambulation activity did not occur: Safety/medical concerns  Assist level: 2 helpers (CGA & +2 w/c follow) Assistive device: Walker-rolling Max distance: 34ft   Walk 10 feet activity   Assist  Walk 10 feet activity did not occur: Safety/medical concerns  Assist level: 2 helpers (CGA & +2 w/c follow) Assistive device:  Walker-rolling   Walk 50 feet activity   Assist Walk 50 feet with 2 turns activity did not occur: Safety/medical concerns  Assist level: 2 helpers (CGA & +2 w/c follow) Assistive device: Walker-rolling    Walk 150 feet activity   Assist Walk 150 feet activity did not occur: Safety/medical concerns         Walk 10 feet on uneven surface  activity   Assist Walk 10 feet on uneven surfaces activity did not occur: Safety/medical concerns         Wheelchair     Assist Will patient use wheelchair at discharge?: Yes (Per PT long term goals) Type of Wheelchair: Manual Wheelchair activity did not occur: Safety/medical concerns         Wheelchair 50 feet with 2 turns activity    Assist    Wheelchair 50 feet with 2 turns activity did not occur: Safety/medical concerns       Wheelchair 150 feet activity     Assist  Wheelchair 150 feet activity did not occur: Safety/medical concerns       Blood pressure (!) 113/59, pulse 87, temperature 97.7 F (36.5 C), resp. rate 18, height 5\' 3"  (1.6 m), weight (!) 203.8 kg, SpO2 94 %.   Medical Problem List and Plan: 1.Debilitysecondary topanniculitis and SIRS  -con't PT and OT- strongly encouraged pt she needs to work with therapy as much as possible  2. Antithrombotics: -DVT/anticoagulation:Pharmaceutical:Lovenox -antiplatelet therapy: N/A 3.Chronic back pain/Pain Management:Used Mobic and robaxin. Continue tylenol prn  5/30- denies pain- con't tylenol prn- NO NSAIDs per renal  6/2- having new onset R knee pain. Doesn't appear to be gout based on clinical exam- will try Tramadol 50 mg q6 hours prn for pain  6/3- pt didn't mention knee pain today? Didn't try tramadol?  6/6- Pain an issue- start voltaren gel QID for R knee; con't tramadol and order R knee brace?will try.  6/7- couldn't get R knee injection- wouldn't do transfer; got R knee brace; con't tramadol and voltaren gel  4.  Mood:LCSW to follow for evaluation and support. -antipsychotic agents: N/A  -provided encouragement, discussed trying to focus on positives in her life given severe depression/anxiety over her social issues/home environment  6/7- focused on her and getting a place to go to- her minister, per pt, is going to get her into [employer]- and then maybe apartment- she cannot sign a lease, because she's been evicted.  5. Neuropsych: This patientiscapable of making decisions on herown behalf. 6. Skin/Wound Care:Routine pressure relief measures. --Continue Silvadene and Interdry per WOC recommendations.  --is Malnourished with albumin 2.1.  Added collagen and protein to promote wound healing --Add vitamin C, Zinc and multivitamin as supplements.   6/2-  Albumin up to 2.4!- continue regimen 7. Fluids/Electrolytes/Nutrition:Monitor I/O.  8. Acute on chronic renal failure with urinary retention:   Creatinine 1.47 on 5/20, labs ordered for Monday  5/23- Cr up to 1.86- will encourage lfuids and recheck Wednesday  5/25- Cr up to 2.14- will start IVFs 75cc/hr NS and recheck in AM- if not much better, will start Foley since likely post obstructive nephropathy, esp since cath volume after voiding was 450cc-  5/26- will place foley and add Flomax to get pt to void   5/29 BUN/Cr demonstrating improvement. appreciate nephrology consult for AKI, continue per recs   -continue foley   5/30- will d/c foley but recheck labs Thursday to see if retaining/Cr up?- d/c IVFs Cr down to 1.51  66/2- Cr down to 1.40- so likely no post obstructive nephropathy.   6/6- Cr up very slightly to 1.47- con't regimen  6/7- will recheck weekly.  9. Hyponatremia: Will continue to monitor. Question dilutional   Sodium 133 on 5/20, labs ordered for Monday    Na up to 137- con't to monitor  6/2- Na 135- con't regimen 10. Anemia of chronic diease: Monitor for signs of bleeding. H/H has been  7-8 range.   Hemoglobin 8.7 on 5/20  5/27- Hb 8.2- con't ot monitor, repeat Monday 11. COPD/OHS/OSA: Has declinedCPAP use. Encourage pulmonary hygeine. On Singulair.  5/31- refusing CPAP! Encouraged her to use.  6/3- pt actually wore CPAP last night per documentation   6/7- continuing to refuse again 12. Schizoaffective bipolar d/o: Continue Wellbutrin with cymbalta and buspar.  6/3- on max dose of Duloxetine for mood; don't want to change meds due to complicated dx.  13. RLS: On Requip. 1. Super super obesity: Educate on appropriate diet, self care and weight loss to help promote mobility and overall health.  5/23- weight down some to 204.6 kg- con't hospital diet.   5/31- will have staff weigh patient on stnading scale with therapy and document- order placed.  6/1- Weight down 1 kg to 203.8 kg from 204. 9 kg- in 1 week.  6/7- will have pt weighed   15. Chronic Insomnia: Seroquel started a month ago-caused dizziness and ineffective  5/20- will try trazodone 25 mg q8pm  5/24- sleeping much better- con't regimen  Appears to be improving 16. Renal mass: Was [facility] to [facility]--had toberescheduled. 17. Panniculitis- with open weeping wounds- will con't WOC recs with interdry and wound care- might need some Diflucan to help with yeast overgrowth.   Diflucan x 1 week (5/26)  5/30- no significant change- con't regimen 18. Dizziness/lightheadedness-   Improving 19. ?Edema?  5/23- I don't see actual edema on exam- tissues soft, nonpitting- will wait on any diuretics, esp because Cr up to 1.86 20. Pt asked for COVID vaccine- will give today  5/24- received vaccine- has nausea- no body aches- will see if from COVID vaccine?  6/7- not due until 6/13! 21. Constipation  5/23- will add Senokot 2 tabs daily-   6/2- going daily now- encouraged pt to ask for miralax if gets constipated with tramadol.  22. Nausea-   5/24- will check KUB and also give compazine prn- has very quiet  bowel sounds.   5/25- KUB (-)  5/31- resolved 23. UTI  5/25- not sure which came first- UTI and retention or retention causing UTI- anyway, starting Bactrim since allergic to PCN- anaphylaxis- also IVFs NS 75cc/hr and will recheck labs in AM   5/26- con't IVFs 75cc/hour and push  fluids PO- also adding Flomax to get pt to void hopefully- and add foley since retaining- due to body habitus, nursing cannot cath regularly.  5/27-29- Bactrim changed to fosfomycin per Pharmacy- was resistant to bactrim- WBC down to 6.4k --avoid bactrim per nephrology recs  5/30- resolved.  5/31- foley out and drinking well   6/6- pt has leukocytosis again WBC 11.8- likely has another UTI and cannot scan her bladder due to habitus, so therefore, will check another U/A and start Macrobid chronically?  6/7- will recheck labs in AM and if still elevated, will recheck U/A and Cx again.  24. Asthma  5/26- will satrt Pulmicort middle dose BID and albuterol inhalers prn per pt's home meds  5/27- d/w nursing- need to make sure doesn't miss pulmicort 25. Losing voice  5/26- will monitor- not painful- could be from allergies combined with vomiting affecting vocal cords?  5/30- is back to normal/resolved 26. Dispo  6/1- will see if neuropsych vs SW can talk to her today.   6/3- scheduled to see Neuropysch today  27. Dispo  6/3- will see if possible to get CNA care after d/c?  6/6- explained to pt it's not SW job to find her a place, which she suggested; She needs to find her a place; and usually pt's out by 30 days.  6/7- pt says minister going to get "her a place".     LOS: 19 days A FACE TO FACE EVALUATION WAS PERFORMED  Damontae Loppnow 09/12/2020, 9:06 AM

## 2020-09-12 NOTE — Progress Notes (Signed)
Patient refused CPAP for the night  

## 2020-09-12 NOTE — Patient Care Conference (Signed)
Inpatient RehabilitationTeam Conference and Plan of Care Update Date: 09/12/2020   Time: 11:30 AM    Patient Name: Heather [name]      Medical Record Number: [id number]  Date of Birth: [birth year] Sex: Female         Room/Bed: 4M08C/4M08C-01 Payor Info: Payor: MEDICAID [place] / Plan: MEDICAID [place] ACCESS / Product Type: *No Product type* /    Admit Date/Time:  08/24/2020  4:02 PM  Primary Diagnosis:  [facility] Problems: Principal Problem:   Debility Active Problems:   Panniculitis   Class 3 severe obesity with serious comorbidity and body mass index (BMI) greater than or equal to 46 in adult [facility])   Physical debility   Insomnia   Anemia of chronic disease   Hyponatremia   AKI (acute kidney injury) ([place])   Chronic bilateral low back pain without sciatica   Super-super obese [facility])    Expected Discharge Date: Expected Discharge Date: 09/13/20  Team Members Present: Physician leading conference: Dr. Courtney Heys Care Coodinator Present: [name], BSW;Dore Oquin Creig Hines, RN, BSN, CRRN Nurse Present: Dorthula Nettles, RN PT Present: Stacy Gardner, PT OT Present: Other (comment) Paulette Blanch, OT) [place] Coordinator present : Ileana Ladd, PT     Current Status/Progress Goal Weekly Team Focus  Bowel/Bladder   continent x2 lbm 6/5  patient will remain continent  assess and toilet Q2H   Swallow/Nutrition/ Hydration             ADL's   limited participation recently. At last assessment was Mod for bathing wheelchair at sink, Min/CGA stand pivot tranfers, Max for toileting  Supervision transfers, Min ADL  ADL retraining, continue AE, standing balance, OOB tolerance, general conditioning   Mobility   CGA-min A bed mobility; CGA transfers RW; gait ~10' CGA RW  CGA-min A  increasing endurance, transfers, gait   Communication             Safety/Cognition/ Behavioral Observations            Pain   no pain  remain pain free  assess pain and administer pain  medication   Skin   skin folds healing blister from moisture  continue skin care  assess skin every shift     Discharge Planning:  Pt discharging to motel   Team Discussion: Tried knee injection, patient refused to get to table. Ordered knee brace and Voltaren gel, WBC's elevated, another UTI possible. Incontinent B/B, skin issues to groin, abdominal folds remain the same. Patient on target to meet rehab goals: no, continuously limited by pain, knee brace is too heavy, thigh width makes it hard to adjust size of brace. Want's to try ace wraps. Min assist for stand pivot transfer, working on Comptroller.  *See Care Plan and progress notes for long and short-term goals.   Revisions to Treatment Plan:  Tramadol for right knee pain.  Teaching Needs: Family education complete.  Current Barriers to Discharge: Decreased caregiver support, Home enviroment access/layout, Incontinence, Wound care, Lack of/limited family support, Insurance for SNF coverage, Weight, Medication compliance and Behavior  Possible Resolutions to Barriers: Continue current medications, provide emotional support.     Medical Summary Current Status: Ordered knee brace for therapy- received it; ? incontinent vs continent B/B; still has abd panniculitis- interdry and silvadene; Refusing CPAP;tramadol for R knee pain  Barriers to Discharge: Behavior;Decreased family/caregiver support;Home enviroment access/layout;Incontinence;Medication compliance;Weight;Wound care;Insurance for SNF coverage  Barriers to Discharge Comments: limited by R knee pain- wants to try ACE wrap;  not Knee brace; Possible Resolutions to [employer]: due for 2nd COVID shot 6/13; need weight- to be done; limited by R knee pain; limited participation- due to R knee pain/depression; needs 30 inch w/c- can't get one;  d/c 6/8- schedule transport   Continued Need for Acute Rehabilitation Level of Care: The patient requires daily  medical management by a physician with specialized training in physical medicine and rehabilitation for the following reasons: Direction of a multidisciplinary physical rehabilitation program to maximize functional independence : Yes Medical management of patient stability for increased activity during participation in an intensive rehabilitation regime.: Yes Analysis of laboratory values and/or radiology reports with any subsequent need for medication adjustment and/or medical intervention. : Yes   I attest that I was present, lead the team conference, and concur with the assessment and plan of the team.   Cristi Loron 09/12/2020, 5:32 PM

## 2020-09-12 NOTE — Progress Notes (Addendum)
Patient ID: Heather [name], female   DOB: 1975-05-16, 45 y.o.   MRN: [id number]   Pt has hotel confirmation at [facility]: [address], [place], [place] [address]  Register scheduled for [place], [place]

## 2020-09-12 NOTE — Progress Notes (Signed)
Patient ID: Heather [name], female   DOB: [birth year], 45 y.o.   MRN: [id number]   Patient approved by [employer]   [place], [place]

## 2020-09-12 NOTE — Progress Notes (Signed)
Physical Therapy Session Note  Patient Details  Name: Taneah Masri Ellery MRN: [id number] Date of Birth: 04/12/1975  Today's Date: 09/12/2020 PT Individual Time: 0900-1000 and 1500-1530 PT Individual Time Calculation (min): 60 min and 30 mins   Short Term Goals: Week 1:  PT Short Term Goal 1 (Week 1): pt to demonstrate supine<>sit min A consistently PT Short Term Goal 1 - Progress (Week 1): Met PT Short Term Goal 2 (Week 1): pt to demonstrate sit<>stand transfer with LRAD mod A x1 consistently PT Short Term Goal 2 - Progress (Week 1): Met PT Short Term Goal 3 (Week 1): pt to demonstrate SPT with LRAD mod A x1 consistently PT Short Term Goal 3 - Progress (Week 1): Met PT Short Term Goal 4 (Week 1): pt to ambulate with LRAD 10' mod A x1 PT Short Term Goal 4 - Progress (Week 1): Met Week 2:  PT Short Term Goal 1 (Week 2): pt to demonstrate transfers CGA with LRAD PT Short Term Goal 2 (Week 2): pt to demonstrate gait 25' CGA with RW PT Short Term Goal 3 (Week 2): pt to stand at least 1 min with or without UE support at [facility]  Skilled Therapeutic Interventions/Progress Updates:    pt received in [facility] and agreeable to therapy. Pt received with R knee hinge brace in place. Pt directed in x2 Sit to stand to Rolling walker with extra time and prolonged rest break between 2/2 fatigue. Pt able to stand 1-2 min each time. Brace lowered greatly post standing and required repositioning. Pt directed in gait training with Rolling walker 10' supervision with one turn. Pt reported she needed to rest and sat at EOB supervision. Pt reported she felt she was unable to complete gait training again 2/2 fatigue and that the brace was too heavy. Pt directed in seated BUE strengthening 2# bicep curls and punches 2x10. Pt required to return to supine to rest per pt, and post rest break pt directed in supine exercises with 2# on BUE and body weight resistance on BLE, brace removed: 2x10 tricep extension, front raises, cross  body punches, heel slides, leg lifts, bridges, glute squeezes. Pt reported she was tired and unable to tolerate sitting in [facility] for longer amount of time during session and requested to remain in bed at end of session. Pt left in bed, All needs in reach and in good condition. Call light in hand.    Session 2: pt received in bed and agreeable to therapy however declined to transfer OOB and reported she had done this multiple times today and was too tired. With encouragement, pt agreeable to sitting EOB for improved trunk and core strengthening. Pt given HEP with: Exercises: Supine Bridge Bent Knee Fallouts Supine Heel Slide  Supine Lower Trunk Rotation  Supine Hip Abduction Supine Ankle Pumps  Hooklying Isometric Hip Flexion with Opposite Arm  Supine March  Seated March  Seated Long Arc Quad Seated Hip Abduction  Seated Biceps Curl  Seated Chest Press with [place] with Dumbbells  With pictures and code for videos present. PT went over all exercises and pt agreeing to understanding them and demonstrated good technique.   PT has been in contact with multiple WC vendors for pt to have [place] for her width, however none have been able to provide this width of chair. Pt currently unable to self propel this width of WC and this width of WC unable to fit through standard size doors and therefore not functional  for pt's mobility. It is currently recommended that pt have supervision for needs and safety with pt being profoundly limited 2/2 her body habitus. PT also explained this to pt and she understands however reports her mother has declined to provide this supervision at DC. Pt reports she has church support to assist at baseline.   Pt left in bed, All needs in reach and in good condition. Call light in hand.  Mother present at end of session.   Therapy Documentation Precautions:  Precautions Precautions: Fall Precaution Comments: reports R knee buckling in standing, 2  previous recent falls. 3 in the past 6 months, skin breakdown in periareas Restrictions Weight Bearing Restrictions: No General:   Vital Signs: Oxygen Therapy SpO2: 94 % O2 Device: Room Air Pain:   Mobility:   Locomotion :    Trunk/Postural Assessment :    Balance:   Exercises:   Other Treatments:      Therapy/Group: Individual Therapy  [name] 09/12/2020, 12:09 PM

## 2020-09-12 NOTE — Plan of Care (Signed)
  Problem: Consults Goal: RH GENERAL PATIENT EDUCATION Description: See Patient Education module for education specifics. Outcome: Progressing Goal: Skin Care Protocol Initiated - if Braden Score 18 or less Description: If consults are not indicated, leave blank or document N/A Outcome: Progressing Goal: Diabetes Guidelines if Diabetic/Glucose > 140 Description: If diabetic or lab glucose is > 140 mg/dl - Initiate Diabetes/Hyperglycemia Guidelines & Document Interventions  Outcome: Progressing   Problem: RH BOWEL ELIMINATION Goal: RH STG MANAGE BOWEL WITH ASSISTANCE Description: STG Manage Bowel with Mod I Assistance. Outcome: Progressing Goal: RH STG MANAGE BOWEL W/MEDICATION W/ASSISTANCE Description: STG Manage Bowel with Medication with Mod I Assistance. Outcome: Progressing   Problem: RH BLADDER ELIMINATION Goal: RH STG MANAGE BLADDER WITH ASSISTANCE Description: STG Manage Bladder With Mod I Assistance Outcome: Progressing Goal: RH STG MANAGE BLADDER WITH MEDICATION WITH ASSISTANCE Description: STG Manage Bladder With Medication With Mod I Assistance. Outcome: Progressing   Problem: RH SKIN INTEGRITY Goal: RH STG SKIN FREE OF INFECTION/BREAKDOWN Description: Skin to remain free from additional breakdown and infection while on rehab with Mod I assistance. Outcome: Progressing Goal: RH STG MAINTAIN SKIN INTEGRITY WITH ASSISTANCE Description: STG Maintain Skin Integrity With Mod I Assistance. Outcome: Progressing Goal: RH STG ABLE TO PERFORM INCISION/WOUND CARE W/ASSISTANCE Description: STG Able To Perform Incision/Wound Care With Mod I Assistance. Outcome: Progressing   Problem: RH SAFETY Goal: RH STG ADHERE TO SAFETY PRECAUTIONS W/ASSISTANCE/DEVICE Description: STG Adhere to Safety Precautions With cues and reminders. Outcome: Progressing Goal: RH STG DECREASED RISK OF FALL WITH ASSISTANCE Description: STG Decreased Risk of Fall With Mod I Assistance. Outcome:  Progressing   Problem: RH PAIN MANAGEMENT Goal: RH STG PAIN MANAGED AT OR BELOW PT'S PAIN GOAL Description: < 4 on a 0-10 pain scale. Outcome: Progressing   Problem: RH KNOWLEDGE DEFICIT GENERAL Goal: RH STG INCREASE KNOWLEDGE OF SELF CARE AFTER HOSPITALIZATION Description: Patient will be able to demonstrate knowledge of medication management, pain management, skin/wound care management with educational materials and handouts provided by staff, at discharge independently. Outcome: Progressing

## 2020-09-12 NOTE — Progress Notes (Signed)
Occupational Therapy Weekly Progress Note  Patient Details  Name: Heather [name] MRN: [id number] Date of Birth: 01-Dec-1975  Beginning of progress report period: Sep 03, 2020 End of progress report period: September 12, 2020  Today's Date: 09/12/2020 OT Individual Time: 4496-7591 and 6384-6659 OT Individual Time Calculation (min): 25 min and 42 min   Patient has met 0 of 3 short term goals.  Pt has made limited progress for this reporting period d/t limited motivation and participation. Pt is CGA with stand pivot transfers to/from [facility] and wheelchair with bariwalker. Sit <> stands with CGA occasional Min A and is now wearing bledsoe brace for R knee d/t pain at times. DC planning continues to be biggest obstacle for this pt as mother no longer feels she can handle her care, DC plan pending.   Patient continues to demonstrate the following deficits: muscle weakness, decreased cardiorespiratoy endurance, decreased coordination and decreased motor planning and decreased sitting balance, decreased standing balance, decreased postural control and decreased balance strategies and therefore will continue to benefit from skilled OT intervention to enhance overall performance with BADL and Reduce care partner burden.  Patient has made inconsistent progress toward OT goals d/t limited participation and motivation this reporting period. Continue to have Min A goals as unclear DC dispo.  Continue plan of care.  OT Short Term Goals Week 2:  OT Short Term Goal 1 (Week 2): Pt will complete a short distance ambulatory BSC transfer using LRAD and Min A OT Short Term Goal 1 - Progress (Week 2): Progressing toward goal OT Short Term Goal 2 (Week 2): Pt will complete perihygiene with no more than Min A using adaptive strategies/AE as needed OT Short Term Goal 2 - Progress (Week 2): Progressing toward goal OT Short Term Goal 3 (Week 2): Pt will complete 1 grooming task while standing at the sink using bari walker to  increase standing endurance OT Short Term Goal 3 - Progress (Week 2): Not met Week 3:  OT Short Term Goal 1 (Week 3): STGs = LTGs d/t ELOS  Skilled Therapeutic Interventions/Progress Updates:    Session 1: Pt greeted at time of session supine in bed agreeable to OT session with encouragement, no pain. Agreeable to get OOB with mod encouragement, not wanting to get OOB but did agree in prep for next session. Donned bledsoe brace bed level with extended time. Supine > sit Min A for trunk with bed features, stand pivot w/ RW to wheelchair with CGA. Time required to set up bari wheelchair as switching back from green > black chair per pt request. Set up in chair with leg rests and adjusted bledsoe brace as it slid down. RT entered at this time for breathing treatment. Hand off to RT.   Session 2: Pt greeted at time of session supine in bed resting no pain. Agreeable to washing hair, declined further ADL. Supine > sit Min A for trunk elevation and stand pivot CGA with bariwalker to green w/c. Set up at sink with green step under feet and therapist assist with hair washing at sink level with pt performing hair drying with towel and grooming tasks seated for UE endurance and strengthening. Stand pivot wheelchair > bed CGA. Sit > supine Mod A today for BLEs. Scoot up in bed extended time with Supervision. Call bell in reach.    Therapy Documentation Precautions:  Precautions Precautions: Fall Precaution Comments: reports R knee buckling in standing, 2 previous recent falls. 3 in the past 6 months, skin  breakdown in periareas Restrictions Weight Bearing Restrictions: No    Therapy/Group: Individual Therapy  Viona Gilmore 09/12/2020, 7:18 AM

## 2020-09-12 NOTE — Progress Notes (Signed)
Patient ID: Heather [name], female   DOB: 04/22/1975, 45 y.o.   MRN: [id number] Team Conference Report to Patient/Family  Team Conference discussion was reviewed with the patient and caregiver, including goals, any changes in plan of care and target discharge date.  Patient and caregiver express understanding and are in agreement.  The patient has a target discharge date of 09/13/20.  SW met with pt, provided team conferences updates. Pt report brace is heavy and uncomfortable. Informed pt of COVID shot being due 6/13. SW scheduled pt transportation for d/c to motel. No additional questions  Dyanne Iha 09/12/2020, 1:08 PM

## 2020-09-12 NOTE — Progress Notes (Signed)
Occupational Therapy Discharge Summary  Patient Details  Name: Heather [name] MRN: [id number] Date of Birth: October 08, 1975   Patient has met 5 of 7 long term goals due to improved activity tolerance and improved balance.  Patient to discharge at overall [place] level.  Patient's care partner unavailable to provide the necessary physical assistance at discharge.  Pt originally going to go back to motel to live with mother, but mother stating she cannot provide assist at this time. Pt dispo is pending funding, likely to motel. SW involved and problem solving. Note the pt has had inconsistent participation and motivation during her stay. Pt issued hygiene aide for toileting and LHS for LB bathing, demonstrated that she can use but does still need some assist for thoroughness.   Reasons goals not met: Pt continues to need Min-Mod A for LB bathing/dressing and toileting d/t body habitus. Pt does use AE for adaptive techniques.   Recommendation:  Patient will benefit from ongoing skilled OT services in TBD setting, would benefit from [facility] to continue to advance functional skills in the area of BADL, iADL and Reduce care partner burden.  Equipment: No equipment provided Pt has BSC and did not shower at PLOF so no shower equipment  Reasons for discharge: discharge from hospital  Patient/family agrees with progress made and goals achieved: Yes  OT Discharge Precautions/Restrictions  Precautions Precautions: Fall Precaution Comments: has had falls previously, new hindged knee brace to decrease risk of buckling Required Braces or Orthoses: Other Brace Other Brace: on R knee: hindged knee brace Restrictions Weight Bearing Restrictions: No Vital Signs Therapy Vitals Temp: 98 F (36.7 C) Pulse Rate: 96 Resp: 17 BP: 111/72 Patient Position (if appropriate): Lying Oxygen Therapy SpO2: 97 % O2 Device: Room Air Pain Pain Assessment Pain Scale: 0-10 Pain Score: 0-No  pain ADL ADL Eating: Not assessed Grooming: Setup Where Assessed-Grooming: Edge of bed Upper Body Bathing: Supervision/safety Where Assessed-Upper Body Bathing: Edge of bed Lower Body Bathing: Moderate assistance (w/ AE) Where Assessed-Lower Body Bathing: Sitting at sink Upper Body Dressing: Minimal assistance Where Assessed-Upper Body Dressing: Edge of bed,Bed level Lower Body Dressing: Moderate assistance (w/ reacher) Where Assessed-Lower Body Dressing: Bed level Toileting: Moderate assistance Toilet Transfer: Therapist, music Method: Stand pivot Tub/Shower Transfer: Not assessed Vision Baseline Vision/History: No visual deficits Patient Visual Report: No change from baseline Vision Assessment?: No apparent visual deficits Perception  Perception: Within Functional Limits Praxis Praxis: Intact Cognition Overall Cognitive Status: Within Functional Limits for tasks assessed Arousal/Alertness: Awake/alert Orientation Level: Oriented X4 Attention: Focused;Sustained Focused Attention: Appears intact Sustained Attention: Appears intact Awareness: Appears intact Problem Solving: Appears intact Safety/Judgment: Appears intact Sensation Sensation Light Touch: Impaired by gross assessment (BLE numb/tingling) Coordination Gross Motor Movements are Fluid and Coordinated: No Fine Motor Movements are Fluid and Coordinated: Yes Coordination and Movement Description: Coordination impacted by body habitus grossly Motor  Motor Motor: Other (comment) (limited by body habitus) Motor - Skilled Clinical Observations: Affected by body habitus, generalized weakness Mobility  Bed Mobility Bed Mobility: Rolling Left;Supine to Sit;Sit to Supine;Scooting to [facility] Rolling Left: Independent with assistive device Supine to Sit: Supervision/Verbal cueing Sit to Supine: Supervision/Verbal cueing Scooting to HOB: Supervision/Verbal Cueing Transfers Sit to Stand: Contact  Guard/Touching assist Stand to Sit: Contact Guard/Touching assist  Trunk/Postural Assessment  Cervical Assessment Cervical Assessment: Within Functional Limits Thoracic Assessment Thoracic Assessment: Exceptions to [facility] Lumbar Assessment Lumbar Assessment: Exceptions to [facility] Postural Control Postural Control: Deficits on evaluation Righting Reactions: decreased  overall and limited 2/2 body habitus Protective Responses: decreased Postural Limitations: decreased overall and limited 2/2 body habitus  Balance Balance Balance Assessed: Yes Static Sitting Balance Static Sitting - Balance Support: Feet supported Static Sitting - Level of Assistance: 6: Modified independent (Device/Increase time) Dynamic Sitting Balance Dynamic Sitting - Balance Support: Feet supported Dynamic Sitting - Level of Assistance: 6: Modified independent (Device/Increase time) Dynamic Sitting - Balance Activities: Lateral lean/weight shifting;Reaching for objects;Reaching across midline Static Standing Balance Static Standing - Balance Support: Bilateral upper extremity supported Static Standing - Level of Assistance: 5: Stand by assistance Dynamic Standing Balance Dynamic Standing - Balance Support: Bilateral upper extremity supported Dynamic Standing - Level of Assistance: 5: Stand by assistance Dynamic Standing - Comments: adjusting clothing during toileting Extremity/Trunk Assessment RUE Assessment RUE Assessment: Within Functional Limits LUE Assessment LUE Assessment: Within Functional Limits   Viona Gilmore 09/12/2020, 4:42 PM

## 2020-09-12 NOTE — Progress Notes (Signed)
Patient ID: Heather [name], female   DOB: 25-Mar-1976, 45 y.o.   MRN: [id number]   SW followed up with pt friend Fannie Knee in regards to motel for d/c to schedule transport for d/c on tomorrow.   Reports he was on a business call and will give SW a call right back with information.  [place], [place]

## 2020-09-13 ENCOUNTER — Other Ambulatory Visit (HOSPITAL_COMMUNITY): Payer: Self-pay

## 2020-09-13 DIAGNOSIS — F339 Major depressive disorder, recurrent, unspecified: Secondary | ICD-10-CM

## 2020-09-13 LAB — CBC WITH DIFFERENTIAL/PLATELET
Abs Immature Granulocytes: 0.14 10*3/uL — ABNORMAL HIGH (ref 0.00–0.07)
Basophils Absolute: 0.1 10*3/uL (ref 0.0–0.1)
Basophils Relative: 0 %
Eosinophils Absolute: 0.5 10*3/uL (ref 0.0–0.5)
Eosinophils Relative: 5 %
HCT: 31.6 % — ABNORMAL LOW (ref 36.0–46.0)
Hemoglobin: 8.9 g/dL — ABNORMAL LOW (ref 12.0–15.0)
Immature Granulocytes: 1 %
Lymphocytes Relative: 23 %
Lymphs Abs: 2.6 10*3/uL (ref 0.7–4.0)
MCH: 23.5 pg — ABNORMAL LOW (ref 26.0–34.0)
MCHC: 28.2 g/dL — ABNORMAL LOW (ref 30.0–36.0)
MCV: 83.6 fL (ref 80.0–100.0)
Monocytes Absolute: 0.6 10*3/uL (ref 0.1–1.0)
Monocytes Relative: 5 %
Neutro Abs: 7.5 10*3/uL (ref 1.7–7.7)
Neutrophils Relative %: 66 %
Platelets: 401 10*3/uL — ABNORMAL HIGH (ref 150–400)
RBC: 3.78 MIL/uL — ABNORMAL LOW (ref 3.87–5.11)
RDW: 26.7 % — ABNORMAL HIGH (ref 11.5–15.5)
WBC: 11.5 10*3/uL — ABNORMAL HIGH (ref 4.0–10.5)
nRBC: 0 % (ref 0.0–0.2)

## 2020-09-13 LAB — BASIC METABOLIC PANEL
Anion gap: 8 (ref 5–15)
BUN: 24 mg/dL — ABNORMAL HIGH (ref 6–20)
CO2: 27 mmol/L (ref 22–32)
Calcium: 9.2 mg/dL (ref 8.9–10.3)
Chloride: 102 mmol/L (ref 98–111)
Creatinine, Ser: 1.36 mg/dL — ABNORMAL HIGH (ref 0.44–1.00)
GFR, Estimated: 50 mL/min — ABNORMAL LOW (ref >60)
Glucose, Bld: 84 mg/dL (ref 70–99)
Potassium: 4 mmol/L (ref 3.5–5.1)
Sodium: 137 mmol/L (ref 135–145)

## 2020-09-13 MED ORDER — SILVER SULFADIAZINE 1 % EX CREA: TOPICAL_CREAM | Freq: Two times a day (BID) | CUTANEOUS | 0 refills | Status: DC

## 2020-09-13 MED ORDER — TRAZODONE HCL 50 MG PO TABS
25.0000 mg | ORAL_TABLET | Freq: Every day | ORAL | 0 refills | Status: DC
  Filled 2020-09-13: qty 15, 30d supply, fill #0

## 2020-09-13 MED ORDER — ASCORBIC ACID 500 MG PO TABS
500.0000 mg | ORAL_TABLET | Freq: Every day | ORAL | 0 refills | Status: DC
  Filled 2020-09-13: qty 30, 30d supply, fill #0

## 2020-09-13 MED ORDER — DICLOFENAC SODIUM 1 % EX GEL: 4.0000 g | Freq: Four times a day (QID) | CUTANEOUS | 0 refills | Status: DC

## 2020-09-13 MED ORDER — TAMSULOSIN HCL 0.4 MG PO CAPS
0.4000 mg | ORAL_CAPSULE | Freq: Every day | ORAL | 0 refills | Status: DC
  Filled 2020-09-13: qty 30, 30d supply, fill #0

## 2020-09-13 MED ORDER — BUSPIRONE HCL 10 MG PO TABS
10.0000 mg | ORAL_TABLET | Freq: Two times a day (BID) | ORAL | 0 refills | Status: DC
  Filled 2020-09-13: qty 60, 30d supply, fill #0

## 2020-09-13 MED ORDER — TRAMADOL HCL 50 MG PO TABS
50.0000 mg | ORAL_TABLET | Freq: Four times a day (QID) | ORAL | 0 refills | Status: DC | PRN
  Filled 2020-09-13: qty 20, 5d supply, fill #0

## 2020-09-13 MED ORDER — ZINC SULFATE 220 (50 ZN) MG PO TABS
220.0000 mg | ORAL_TABLET | Freq: Every day | ORAL | 0 refills | Status: DC
  Filled 2020-09-13: qty 30, 30d supply, fill #0

## 2020-09-13 MED ORDER — BUSPIRONE HCL 10 MG PO TABS: 10.0000 mg | ORAL_TABLET | Freq: Two times a day (BID) | ORAL | 0 refills | Status: DC

## 2020-09-13 MED ORDER — ADULT MULTIVITAMIN W/MINERALS CH: 1.0000 | ORAL_TABLET | Freq: Every day | ORAL | Status: DC

## 2020-09-13 MED ORDER — SILVER SULFADIAZINE 1 % EX CREA
TOPICAL_CREAM | Freq: Two times a day (BID) | CUTANEOUS | 0 refills | Status: DC
  Filled 2020-09-13: qty 400, 30d supply, fill #0

## 2020-09-13 MED ORDER — SENNOSIDES-DOCUSATE SODIUM 8.6-50 MG PO TABS: 2.0000 | ORAL_TABLET | Freq: Every day | ORAL | 0 refills | Status: DC

## 2020-09-13 MED ORDER — DICLOFENAC SODIUM 1 % EX GEL
4.0000 g | Freq: Four times a day (QID) | CUTANEOUS | 0 refills | Status: DC
  Filled 2020-09-13: qty 300, 30d supply, fill #0

## 2020-09-13 MED ORDER — ASCORBIC ACID 500 MG PO TABS: 500.0000 mg | ORAL_TABLET | Freq: Every day | ORAL | 0 refills | Status: DC

## 2020-09-13 MED ORDER — TAMSULOSIN HCL 0.4 MG PO CAPS: 0.4000 mg | ORAL_CAPSULE | Freq: Every day | ORAL | 0 refills | Status: DC

## 2020-09-13 MED ORDER — SENNOSIDES-DOCUSATE SODIUM 8.6-50 MG PO TABS
2.0000 | ORAL_TABLET | Freq: Every day | ORAL | 0 refills | Status: DC
  Filled 2020-09-13: qty 60, 30d supply, fill #0

## 2020-09-13 MED ORDER — TRAMADOL HCL 50 MG PO TABS: 50.0000 mg | ORAL_TABLET | Freq: Four times a day (QID) | ORAL | 0 refills | Status: DC | PRN

## 2020-09-13 MED ORDER — JUVEN PO PACK: 1.0000 | PACK | Freq: Three times a day (TID) | ORAL | 0 refills | Status: DC

## 2020-09-13 MED ORDER — ZINC SULFATE 220 (50 ZN) MG PO CAPS: 220.0000 mg | ORAL_CAPSULE | Freq: Every day | ORAL | 0 refills | Status: DC

## 2020-09-13 MED ORDER — TRAZODONE HCL 50 MG PO TABS: 25.0000 mg | ORAL_TABLET | Freq: Every day | ORAL | 0 refills | Status: DC

## 2020-09-13 NOTE — Progress Notes (Signed)
Skin folds examined and show great improvement. Patient instructed on importance of keeping folds dry--frequent checks during the day and pressure relief measures. Bra would help prevent chafing.     Under right breast                                           Under left breast        Under right pannus                                                    Under right pannus    Intertriginous areas                                              Lower perineal area

## 2020-09-13 NOTE — Progress Notes (Signed)
Patient ID: Heather [name], female   DOB: [birth year], 45 y.o.   MRN: [id number]  PTAR transportation moved to [place], [place]

## 2020-09-13 NOTE — Progress Notes (Signed)
PTAR came to transfer patient to hotel. Patient refused stating "I do not feel safe going to a hotel and I would rather be discharged to a skilled nursing facility." Patient remains in her room at this time.

## 2020-09-13 NOTE — Progress Notes (Signed)
Motel check-in is at 1500 per pt. Transport scheduled for 1000. Left msg for SW regarding concern.  MD also aware of unavailability of room till 1500.

## 2020-09-13 NOTE — Progress Notes (Signed)
Provided instructions for [employer] at home.

## 2020-09-13 NOTE — Discharge Instructions (Signed)
  Inpatient Rehab Discharge Instructions  Heather [name] Discharge date and time: 09/13/20   Activities/Precautions/ Functional Status: Activity: no lifting, driving, or strenuous exercise till cleared by MD Diet: regular diet Wound Care: clean skin folds gently with soap and water, pat dry then apply a thin layer of silvadene cream. Cover with silver cloths. Wash cloths every 5-7 days or if they get moist. Dry of any moisture between folds 2-3 times a day.    Functional status:  ___ No restrictions     ___ Walk up steps independently _X 24/7 supervision/assistance   ___ Walk up steps with assistance ___ Intermittent supervision/assistance  ___ Bathe/dress independently ___ Walk with walker     _X__ Bathe/dress with assistance ___ Walk Independently    ___ Shower independently _X__ Walk with assistance    ___ Shower with assistance _X__ No alcohol     ___ Return to work/school ________    Special Instructions: 1. 2nd Covid injection due after 06/13    COMMUNITY REFERRALS UPON DISCHARGE:    Home Health:   PT     OT                    Agency: [facility]  Phone: [phone]    My questions have been answered and I understand these instructions. I will adhere to these goals and the provided educational materials after my discharge from the hospital.  Patient/Caregiver Signature _______________________________ Date __________  Clinician Signature _______________________________________ Date __________  Please bring this form and your medication list with you to all your follow-up doctor's appointments.

## 2020-09-13 NOTE — Discharge Summary (Addendum)
Physician Discharge Summary  Patient ID: Heather [name] MRN: [id number] DOB/AGE: 1976-03-07 45 y.o.  Admit date: 08/24/2020 Discharge date: 09/14/2020  Discharge Diagnoses:  Principal Problem:   Debility Active Problems:   Leucocytosis   Panniculitis   Class 3 severe obesity with serious comorbidity and body mass index (BMI) greater than or equal to 70 in adult [facility])   Insomnia   Anemia of chronic disease   Hyponatremia   AKI (acute kidney injury) ([place])   Chronic bilateral low back pain without sciatica   Major depression, chronic   Discharged Condition: Stable  Significant Diagnostic Studies: DG Abd 1 View  Result Date: 08/29/2020 CLINICAL DATA:  Nausea, vomiting. EXAM: ABDOMEN - 1 VIEW COMPARISON:  February 09, 2011. FINDINGS: The bowel gas pattern is normal. No radio-opaque calculi or other significant radiographic abnormality are seen. IMPRESSION: Negative. Electronically Signed   By: Marijo Conception M.D.   On: 08/29/2020 11:54     Labs:  Basic Metabolic Panel: Recent Labs  Lab 09/07/20 0451 09/11/20 0621 09/13/20 0529  NA 135 139 137  K 4.2 4.1 4.0  CL 99 103 102  CO2 28 27 27   GLUCOSE 92 91 84  BUN 18 21* 24*  CREATININE 1.40* 1.47* 1.36*  CALCIUM 9.5 9.5 9.2    CBC: Recent Labs  Lab 09/11/20 0621 09/13/20 0529  WBC 11.8* 11.5*  NEUTROABS  --  7.5  HGB 9.7* 8.9*  HCT 34.1* 31.6*  MCV 83.2 83.6  PLT 488* 401*    CBG: No results for input(s): GLUCAP in the last 168 hours.  Brief HPI:   Heather [name] is a 45 y.o. female with history of chronic low back pain, super morbid obesity, untreated sleep apnea, recent admission post fall with UTI diabetic colitis who declined SNF and was set up at [facility] for discharge.  She was readmitted 05/13 with fall and inability to walk.  She was noted to be hypotensive had leukocytosis with white count of 12.4 and was started on IV Rocephin for SIRS as well as IV fluids for acute on chronic renal failure.  Blood  cultures urine cultures were negative and antibiotics were DC'd on 605/15.  Work was consulted for input on MAST to multiple skin folds and recommended Silvadene ointment as well as Interdry.  Foley was placed to prevent contamination and worsening of wounds.  She was noted to be limited by weakness and debility.  CIR was recommended due to functional decline.Marland Kitchen   Hospital Course: Heather [name] was admitted to rehab 08/24/2020 for inpatient therapies to consist of PT and OT at least three hours five days a week. Past admission physiatrist, therapy team and rehab RN have worked together to provide customized collaborative inpatient rehab. She was noted to have evidence of malnutrition and collagen, protein supplements as well as multiple vitamins were added to help promote healing.  Wound care has been ongoing with Silvadene as well as Interdry to areas of breakdown due to MASD.  She was treated with a dose of Diflucan to help with yeast overgrowth.  Areas of breakdown and maceration have greatly improved with daily local care.   She developed acute on chronic renal failure and was started on IV fluids without improvement.  She did have recurrent UTI treated with with dose of fosfomycin.  Nephrology was consulted for input and felt that HPI in part due to retention with recent AKI with rhabdo.  She was treated with IV fluids x24 hours conditions to  avoid NSAIDs.  Foley was placed due to concern of obstructive uropathy and Flomax was added to help with voiding function.  She was treated with Feraheme x1 for anemia disease. Acute on chronic renal failure has improved with serum creatinine down to 1.36 and hyponatremia has resolved.  Foley was discontinued and she was noted to be voiding without difficulty.  Follow-up check of lytes show that renal status is stable   Blood pressures were monitored on TID basis and has been stable.  She received COVID-vaccine on 05/24 and second dose due on 06/13.  Bowel  program was augmented to manage constipation.  Insomnia has improved with addition of low-dose trazodone.  Serial CBC showed H/H is stable.  She was found to have rising WBC prior to discharge.  No signs of leukocytosis, fever or infection noted. She reported right knee pain with increase in activity and Voltaren gel as well as tramadol was used on as needed basis right knee brace was ordered for support and patient was advised on monitoring skin for breakdown.  Radiology was consulted to assist with right knee steroid injections however patient had anxiety about getting onto the table therefore this was aborted.  CPAP was ordered for use however patient continued declined this during her stay.  Patient's progress in rehab has been limited in part due to variable participation, issues with motivation, poor activity tolerance as well as body habitus.  Supervision is recommended for safety at discharge.  Length of stay was extended due to issues with disposition.  To assist with this and she was discharged to a motel on 06/09.   Rehab course: During patient's stay in rehab weekly team conferences were held to monitor patient's progress, set goals and discuss barriers to discharge. At admission, patient required max to total assist for basic ADL task and max assist with mobility. She  has had improvement in activity tolerance, balance, postural control as well as ability to compensate for deficits.  She requires set up assist for upper body care and moderate assist for lower body care and toileting tasks. She requires contact-guard to min assist occasionally for sit to stand transfers and to ambulate short distances.  Discharge disposition: 01-Home or Self Care  Diet: Regular.  Special Instructions: Will need to reschedule urology appointment at [facility]. 2.  Continue good hygiene measures--monitor for moisture under skin folds 1-2 times a day.  Keep area clean and dry. Apply silvadene cream to open areas to  promote healing and use interdry.  3.  Continue home exercise plan to help build up endurance and strength.   Allergies as of 09/13/2020       Reactions   Bee Venom Shortness Of Breath   Penicillins Anaphylaxis   Has patient had a PCN reaction causing immediate rash, facial/tongue/throat swelling, SOB or lightheadedness with hypotension: No Has patient had a PCN reaction causing severe rash involving mucus membranes or skin necrosis: No Has patient had a PCN reaction that required hospitalization No Has patient had a PCN reaction occurring within the last 10 years: No If all of the above answers are "NO", then may proceed with Cephalosporin use.' REACTION: Angioedema   Penicillin G    Adhesive [tape] Rash   Latex Rash   Vancomycin Other (See Comments)   Pt can tolerate Vancomycin but did cause Vancomycin Infusion Reaction.  Recommend to pre-medicate with Benadryl before doses administered.          Medication List     STOP taking these  medications    alum & mag hydroxide-simeth 200-200-20 MG/5ML suspension Commonly known as: MAALOX/MYLANTA   nystatin powder Commonly known as: MYCOSTATIN/NYSTOP   QUEtiapine 25 MG tablet Commonly known as: SEROQUEL   Vitamin D3 125 MCG (5000 UT) Caps   vitamin E 180 MG (400 UNITS) capsule       TAKE these medications    acetaminophen 325 MG tablet Commonly known as: TYLENOL Take 650 mg by mouth every 6 (six) hours as needed for mild pain, fever or headache.   albuterol 108 (90 Base) MCG/ACT inhaler Commonly known as: VENTOLIN HFA INHALE 2 PUFFS INTO THE LUNGS EVERY 4 HOURS AS NEEDED FOR WHEEZING OR SHORTNESS OF BREATH   buPROPion 300 MG 24 hr tablet Commonly known as: WELLBUTRIN XL TAKE 1 TABLET(300 MG) BY MOUTH DAILY   busPIRone 10 MG tablet Commonly known as: BUSPAR Take 1 tablet (10 mg total) by mouth 2 (two) times daily.   diclofenac Sodium 1 % Gel Commonly known as: VOLTAREN Apply 4 g topically 4 (four) times  daily. Notes to patient: To your knees   DULoxetine 30 MG capsule Commonly known as: Cymbalta Take 2 capsules (60 mg total) by mouth daily.   Flovent HFA 110 MCG/ACT inhaler Generic drug: fluticasone Inhale 2 puffs into the lungs 2 (two) times daily.   fluticasone 50 MCG/ACT nasal spray Commonly known as: FLONASE Place 1 spray into both nostrils in the morning and at bedtime.   folic acid 1 MG tablet Commonly known as: FOLVITE Take 1 tablet (1 mg total) by mouth daily.   montelukast 10 MG tablet Commonly known as: SINGULAIR Take 1 tablet (10 mg total) by mouth at bedtime.   multivitamin with minerals Tabs tablet Take 1 tablet by mouth daily. Start taking on: September 14, 2020   rOPINIRole 3 MG tablet Commonly known as: REQUIP TAKE 1 TABLET(3 MG) BY MOUTH AT BEDTIME   Senexon-S 8.6-50 MG tablet Generic drug: senna-docusate Take 2 tablets by mouth daily. Start taking on: September 14, 2020   SSD 1 % cream Generic drug: silver sulfADIAZINE Apply topically 2 (two) times daily. What changed:  how to take this when to take this additional instructions   tamsulosin 0.4 MG Caps capsule Commonly known as: FLOMAX Take 1 capsule (0.4 mg total) by mouth daily after supper.   traMADol 50 MG tablet--Rx# 20 pills. Commonly known as: ULTRAM Take 1 tablet (50 mg total) by mouth every 6 (six) hours as needed for severe pain.   traZODone 50 MG tablet Commonly known as: DESYREL Take 0.5 tablets (25 mg total) by mouth at bedtime.   Vitamin B-12 5000 MCG Tbdp Take 5,000 mcg by mouth daily.   vitamin C 500 MG tablet Commonly known as: ASCORBIC ACID Take 1 tablet (500 mg total) by mouth daily. Start taking on: September 14, 2020   Zinc Sulfate 220 (50 Zn) MG Tabs Take 1 tablet (220 mg total) by mouth daily. Start taking on: September 14, 2020        Follow-up Information     Lucianne Lei, MD. Call.   Specialty: Family Medicine Why: for post hospital follow up Contact information: [address] [place] [address] [phone]         Courtney Heys, MD. Call.   Specialty: Physical Medicine and Rehabilitation Why: as needed Contact information: [address] [place] [address] [phone]  Signed: Bary Leriche 09/14/2020, 11:36 PM

## 2020-09-13 NOTE — Progress Notes (Signed)
Inpatient Rehabilitation Care Coordinator Discharge Note  The overall goal for the admission was met for:   Discharge location: Yes, home  Length of Stay: Yes, 20 Days  Discharge activity level: Yes, CGA/MinA  Home/community participation: Yes  Services provided included: MD, RD, PT, OT, SLP, RN, CM, TR, Pharmacy, Neuropsych and SW  Financial Services: Medicaid  Choices offered to/list presented to:pt  Follow-up services arranged: Home Health: [facility]  Comments (or additional information): PT OT  Patient/Family verbalized understanding of follow-up arrangements: Yes  Individual responsible for coordination of the follow-up plan: pt, [phone]  Confirmed correct DME delivered: Dyanne Iha 09/13/2020    Dyanne Iha

## 2020-09-13 NOTE — Progress Notes (Signed)
PROGRESS NOTE   Subjective/Complaints:  Pt is ready for d/c today- explained labs looking better- don't think WBC is an issue- due to lack of L shift in diff and afebrile as well as no Sx's of any infection .   Pt asking to have transport at later time today since cannot check into motel until 3pm- will see if SW can arrange.    ROS:   Pt denies SOB, abd pain, CP, N/V/C/D, and vision changes      Objective:   No results found. Recent Labs    09/11/20 0621 09/13/20 0529  WBC 11.8* 11.5*  HGB 9.7* 8.9*  HCT 34.1* 31.6*  PLT 488* 401*   Recent Labs    09/11/20 0621 09/13/20 0529  NA 139 137  K 4.1 4.0  CL 103 102  CO2 27 27  GLUCOSE 91 84  BUN 21* 24*  CREATININE 1.47* 1.36*  CALCIUM 9.5 9.2    Intake/Output Summary (Last 24 hours) at 09/13/2020 3662 Last data filed at 09/13/2020 0700 Gross per 24 hour  Intake 1554 ml  Output --  Net 1554 ml        Physical Exam: Vital Signs Blood pressure 131/67, pulse 87, temperature 98.1 F (36.7 C), temperature source Oral, resp. rate 19, height 5\' 3"  (1.6 m), weight (!) 203.8 kg, SpO2 99 %. Gen: no distress, normal appearing       General: awake, alert, appropriate, on bariatric low air loss mattress; NAD HENT: conjugate gaze; oropharynx moist; edentulous CV: regular rate; no JVD Pulmonary: CTA B/L; no W/R/R- good air movement GI: soft, NT, ND, (+)BS Psychiatric: flat depressed affect Neurological: alert  Skin:   Wounds under pannus- interdry in place-still some weeping- no change; no assessed today bruising and knot in LUE above elbow- from lovenox inj.  Musc: no obvious edema; also VERY TTP over R patella- esp with moving patella slightly. No heat or no erythema- no change  Motor: Bilateral upper extremities: 4+/5 proximal distal Bilateral lower extremities: Hip flexion, knee extension 4/5, ankle dorsiflexion 5/5    Assessment/Plan: 1. Functional  deficits which require 3+ hours per day of interdisciplinary therapy in a comprehensive inpatient rehab setting.  Physiatrist is providing [facility] team supervision and 24 hour management of active medical problems listed below.  Physiatrist and rehab team continue to assess barriers to discharge/monitor patient progress toward functional and medical goals  Care Tool:  Bathing    Body parts bathed by patient: Right arm,Left arm,Chest,Abdomen,Right upper leg,Left upper leg,Right lower leg,Face,Left lower leg   Body parts bathed by helper: Right lower leg Body parts n/a: Front perineal area,Buttocks   Bathing assist Assist Level: Moderate Assistance - Patient 50 - 74%     Upper Body Dressing/Undressing Upper body dressing   What is the patient wearing?: Dress    Upper body assist Assist Level: Set up assist    Lower Body Dressing/Undressing Lower body dressing      What is the patient wearing?: Pants     Lower body assist Assist for lower body dressing: Moderate Assistance - Patient 50 - 74%     Toileting Toileting    Toileting assist Assist for toileting:  Moderate Assistance - Patient 50 - 74%     Transfers Chair/bed transfer  Transfers assist  Chair/bed transfer activity did not occur: Safety/medical concerns  Chair/bed transfer assist level: Contact Guard/Touching assist Chair/bed transfer assistive device: Programmer, multimedia   Ambulation assist   Ambulation activity did not occur: Safety/medical concerns  Assist level: Contact Guard/Touching assist Assistive device: Walker-rolling Max distance: 15   Walk 10 feet activity   Assist  Walk 10 feet activity did not occur: Safety/medical concerns  Assist level: Contact Guard/Touching assist Assistive device: Walker-rolling   Walk 50 feet activity   Assist Walk 50 feet with 2 turns activity did not occur: Safety/medical concerns  Assist level: 2 helpers (CGA & +2 w/c follow) Assistive  device: Walker-rolling    Walk 150 feet activity   Assist Walk 150 feet activity did not occur: Safety/medical concerns         Walk 10 feet on uneven surface  activity   Assist Walk 10 feet on uneven surfaces activity did not occur: Safety/medical concerns         Wheelchair     Assist Will patient use wheelchair at discharge?: Yes Type of Wheelchair: Manual Wheelchair activity did not occur: Safety/medical concerns  Wheelchair assist level: Maximal Assistance - Patient 25 - 49% Max wheelchair distance: 5    Wheelchair 50 feet with 2 turns activity    Assist    Wheelchair 50 feet with 2 turns activity did not occur: Safety/medical concerns       Wheelchair 150 feet activity     Assist  Wheelchair 150 feet activity did not occur: Safety/medical concerns       Blood pressure 131/67, pulse 87, temperature 98.1 F (36.7 C), temperature source Oral, resp. rate 19, height 5\' 3"  (1.6 m), weight (!) 203.8 kg, SpO2 99 %.   Medical Problem List and Plan: 1.Debilitysecondary topanniculitis and SIRS  -con't PT and OT- strongly encouraged pt she needs to work with therapy as much as possible   D/c today to motel- her minister is covering it.  2. Antithrombotics: -DVT/anticoagulation:Pharmaceutical:Lovenox -antiplatelet therapy: N/A 3.Chronic back pain/Pain Management:Used Mobic and robaxin. Continue tylenol prn  5/30- denies pain- con't tylenol prn- NO NSAIDs per renal  6/2- having new onset R knee pain. Doesn't appear to be gout based on clinical exam- will try Tramadol 50 mg q6 hours prn for pain  6/3- pt didn't mention knee pain today? Didn't try tramadol?  6/6- Pain an issue- start voltaren gel QID for R knee; con't tramadol and order R knee brace?will try.  6/7- couldn't get R knee injection- wouldn't do transfer; got R knee brace; con't tramadol and voltaren gel   6/8- send home on regimen- for now- will need to see PCP for  refills-  4. Mood:LCSW to follow for evaluation and support. -antipsychotic agents: N/A  -provided encouragement, discussed trying to focus on positives in her life given severe depression/anxiety over her social issues/home environment  6/7- focused on her and getting a place to go to- her minister, per pt, is going to get her into Motel- and then maybe apartment- she cannot sign a lease, because she's been evicted.  5. Neuropsych: This patientiscapable of making decisions on herown behalf. 6. Skin/Wound Care:Routine pressure relief measures. --Continue Silvadene and Interdry per WOC recommendations.  --is Malnourished with albumin 2.1.  Added collagen and protein to promote wound healing --Add vitamin C, Zinc and multivitamin as supplements.   6/2- Albumin up to 2.4!-  continue regimen 7. Fluids/Electrolytes/Nutrition:Monitor I/O.  8. Acute on chronic renal failure with urinary retention:   Creatinine 1.47 on 5/20, labs ordered for Monday  5/23- Cr up to 1.86- will encourage lfuids and recheck Wednesday  5/25- Cr up to 2.14- will start IVFs 75cc/hr NS and recheck in AM- if not much better, will start Foley since likely post obstructive nephropathy, esp since cath volume after voiding was 450cc-  5/26- will place foley and add Flomax to get pt to void   5/29 BUN/Cr demonstrating improvement. appreciate nephrology consult for AKI, continue per recs   -continue foley   5/30- will d/c foley but recheck labs Thursday to see if retaining/Cr up?- d/c IVFs Cr down to 1.51  66/2- Cr down to 1.40- so likely no post obstructive nephropathy.   6/6- Cr up very slightly to 1.47- con't regimen  6/7- will recheck weekly.   6/8- Cr down slightly to 1.36- con't regimen 9. Hyponatremia: Will continue to monitor. Question dilutional   Sodium 133 on 5/20, labs ordered for Monday    Na up to 137- con't to monitor  6/2- Na 135- con't regimen 10. Anemia  of chronic diease: Monitor for signs of bleeding. H/H has been 7-8 range.   Hemoglobin 8.7 on 5/20  5/27- Hb 8.2- con't ot monitor, repeat Monday 11. COPD/OHS/OSA: Has declinedCPAP use. Encourage pulmonary hygeine. On Singulair.  5/31- refusing CPAP! Encouraged her to use.  6/3- pt actually wore CPAP last night per documentation   6/7- continuing to refuse again 12. Schizoaffective bipolar d/o: Continue Wellbutrin with cymbalta and buspar.  6/3- on max dose of Duloxetine for mood; don't want to change meds due to complicated dx.  13. RLS: On Requip. 6. Super super obesity: Educate on appropriate diet, self care and weight loss to help promote mobility and overall health.  5/23- weight down some to 204.6 kg- con't hospital diet.   5/31- will have staff weigh patient on stnading scale with therapy and document- order placed.  6/1- Weight down 1 kg to 203.8 kg from 204. 9 kg- in 1 week.  6/7- will have pt weighed   15. Chronic Insomnia: Seroquel started a month ago-caused dizziness and ineffective  5/20- will try trazodone 25 mg q8pm  5/24- sleeping much better- con't regimen  Appears to be improving 16. Renal mass: Was [facility] at [facility]--had toberescheduled. 17. Panniculitis- with open weeping wounds- will con't WOC recs with interdry and wound care- might need some Diflucan to help with yeast overgrowth.   Diflucan x 1 week (5/26)  5/30- no significant change- con't regimen 18. Dizziness/lightheadedness-   Improving 19. ?Edema?  5/23- I don't see actual edema on exam- tissues soft, nonpitting- will wait on any diuretics, esp because Cr up to 1.86 20. Pt asked for COVID vaccine- will give today  5/24- received vaccine- has nausea- no body aches- will see if from COVID vaccine?  6/7- not due until 6/13! 21. Constipation  5/23- will add Senokot 2 tabs daily-   6/2- going daily now- encouraged pt to ask for miralax if gets constipated with tramadol.  22. Nausea-    5/24- will check KUB and also give compazine prn- has very quiet bowel sounds.   5/25- KUB (-)  5/31- resolved 23. UTI  5/25- not sure which came first- UTI and retention or retention causing UTI- anyway, starting Bactrim since allergic to PCN- anaphylaxis- also IVFs NS 75cc/hr and will recheck labs in AM   5/26-  con't IVFs 75cc/hour and push fluids PO- also adding Flomax to get pt to void hopefully- and add foley since retaining- due to body habitus, nursing cannot cath regularly.  5/27-29- Bactrim changed to fosfomycin per Pharmacy- was resistant to bactrim- WBC down to 6.4k --avoid bactrim per nephrology recs  5/30- resolved.  5/31- foley out and drinking well   6/6- pt has leukocytosis again WBC 11.8- likely has another UTI and cannot scan her bladder due to habitus, so therefore, will check another U/A and start Macrobid chronically?  6/7- will recheck labs in AM and if still elevated, will recheck U/A and Cx again.   6/8- WBC down slightly to 11.5- also no Left shift, no Sx's and no fever- will not send out on ABX 24. Asthma  5/26- will satrt Pulmicort middle dose BID and albuterol inhalers prn per pt's home meds  5/27- d/w nursing- need to make sure doesn't miss pulmicort 25. Losing voice  5/26- will monitor- not painful- could be from allergies combined with vomiting affecting vocal cords?  5/30- is back to normal/resolved 26. Dispo  6/1- will see if neuropsych vs SW can talk to her today.   6/3- scheduled to see Neuropysch today  27. Dispo  6/3- will see if possible to get CNA care after d/c?  6/6- explained to pt it's not SW job to find her a place, which she suggested; She needs to find her a place; and usually pt's out by 30 days.  6/7- pt says minister going to get "her a place".   6/8- pt has debility- doesn't need to see [place] in f/u- does need to make f/u with PCP ASAP to make sure gets refills for meds.    LOS: 20 days A FACE TO FACE EVALUATION WAS PERFORMED  Rumor Sun 09/13/2020, 8:22 AM

## 2020-09-14 NOTE — Progress Notes (Signed)
Patient ID: Heather [name], female   DOB: Aug 12, 1975, 44 y.o.   MRN: [id number]  SW called [facility] to reschedule patient transport. [facility] initially refused to reschedule patient due to hx of patient.   Sw informed [facility] that mother/friend would pick up patient belongings and bring them to hotel to meet patient. Informed PTAR that patient friend planned on meeting patient at motel on yesterday, but friend was unable to wait for patient until 11PM when they attempted pick up. Friend will be available today after work to help patient get settled. Pt is also set up with [facility] that will be seeing her at the motel for continued therapy.    PTAR scheduled at [facility] today with anticipation of patient being pick up timely.   [place], [place]

## 2020-09-14 NOTE — Progress Notes (Signed)
Patient ID: Heather [name], female   DOB: 19-Dec-1975, 45 y.o.   MRN: [id number]  Pt friend has picked up belongings [name]). Pt friends [name]) heading to motel to meet pt upon arrival.  [name], [place]

## 2020-09-14 NOTE — Progress Notes (Signed)
Patient refused CPAP for tonight 

## 2020-09-14 NOTE — Progress Notes (Signed)
[facility] has picked up pt.

## 2020-09-14 NOTE — Progress Notes (Signed)
09/13/20: provided education on InterDry. Discussed in detail how to use and provided written instruction.   09/14/20: Discussed importance of maintaining skin in areas where moisture is noted. Discussed alternative ways to reduce moisture during hotter months to avoid skin breakdown. Pt demonstrated washing affected areas with soap and water and drying skin. Pt applied medicated cream to all affected areas. Pt applied interDry correctly. Expressed importance of keeping interDry in long flat sheet so that all of the affected areas are covered. Discussed washing the interDay when using new one so that previous sheet has time to dry. Encouraged to hand wash. Pt verbalizes an understanding of wound care and did return demonstration.

## 2020-09-14 NOTE — Progress Notes (Addendum)
Patient ID: Heather [name], female   DOB: 09-10-1975, 45 y.o.   MRN: [id number]  Patient church members will be waiting for patient's arrival at motel, SW will inform them when patient is on her way!  Please call Modesta Messing [phone] when patient is being transported.   [place], [place]

## 2020-09-14 NOTE — Progress Notes (Signed)
PROGRESS NOTE   Subjective/Complaints:  Pt didn't d/c yesterday- not sure if we can arrange for her to go today? Transport didn't come til 11pm and told pt dhe would "do better in a nursing home".   Explained to pt nursing homes don't take pt with weight >400lbs, unfortunately, and that is not an option.  Also explained that ideally, we would like her to have 24 hour assist at d/c, however since her mother has refused to help her at d/c, which was agreed upon when she came to rehab, we are dealing with what we have to work with- she cannot stay in hospital indefinitely- esp because her only medical issue is wounds on her abdomen, that is an outpt issue. .    Pt also notes transport said they would take her equipment- which her mother was to come get, but did not.   ROS:    Pt denies SOB, abd pain, CP, N/V/C/D, and vision changes   Objective:   No results found. Recent Labs    09/13/20 0529  WBC 11.5*  HGB 8.9*  HCT 31.6*  PLT 401*   Recent Labs    09/13/20 0529  NA 137  K 4.0  CL 102  CO2 27  GLUCOSE 84  BUN 24*  CREATININE 1.36*  CALCIUM 9.2    Intake/Output Summary (Last 24 hours) at 09/14/2020 0855 Last data filed at 09/13/2020 1720 Gross per 24 hour  Intake 728 ml  Output --  Net 728 ml        Physical Exam: Vital Signs Blood pressure 124/66, pulse 85, temperature 98.5 F (36.9 C), temperature source Oral, resp. rate 20, height 5\' 3"  (1.6 m), weight (!) 203.8 kg, SpO2 96 %. Gen: no distress, normal appearing        General: awake, alert, appropriate, laying on low air loss bariatric mattress, NAD HENT: conjugate gaze; oropharynx moist CV: regular rate; no JVD Pulmonary: CTA B/L; no W/R/R- good air movement GI: soft, NT, ND, (+)BS; protuberant; BMI 79 Psychiatric: appropriate; but has depressed, flat affect Neurological: alert Skin:   Wounds under pannus- interdry in place-still some  weeping- no change; no assessed today bruising and knot in LUE above elbow- from lovenox inj.  Musc: no obvious edema; also TTP over R patella- esp with moving patella slightly. No heat or no erythema- still TTP- cannot use brace- rides up.  Motor: Bilateral upper extremities: 4+/5 proximal distal Bilateral lower extremities: Hip flexion, knee extension 4/5, ankle dorsiflexion 5/5    Assessment/Plan: 1. Functional deficits which require 3+ hours per day of interdisciplinary therapy in a comprehensive inpatient rehab setting. Physiatrist is providing [place] team supervision and 24 hour management of active medical problems listed below. Physiatrist and rehab team continue to assess barriers to discharge/monitor patient progress toward functional and medical goals  Care Tool:  Bathing    Body parts bathed by patient: Right arm, Left arm, Chest, Abdomen, Right upper leg, Left upper leg, Right lower leg, Face, Left lower leg   Body parts bathed by helper: Right lower leg Body parts n/a: Front perineal area, Buttocks   Bathing assist Assist Level: Moderate Assistance - Patient 50 - 74%  Upper Body Dressing/Undressing Upper body dressing   What is the patient wearing?: Dress    Upper body assist Assist Level: Set up assist    Lower Body Dressing/Undressing Lower body dressing      What is the patient wearing?: Pants     Lower body assist Assist for lower body dressing: Moderate Assistance - Patient 50 - 74%     Toileting Toileting    Toileting assist Assist for toileting: Moderate Assistance - Patient 50 - 74%     Transfers Chair/bed transfer  Transfers assist  Chair/bed transfer activity did not occur: Safety/medical concerns  Chair/bed transfer assist level: Contact Guard/Touching assist Chair/bed transfer assistive device: Programmer, multimedia   Ambulation assist   Ambulation activity did not occur: Safety/medical concerns  Assist level:  Contact Guard/Touching assist Assistive device: Walker-rolling Max distance: 15   Walk 10 feet activity   Assist  Walk 10 feet activity did not occur: Safety/medical concerns  Assist level: Contact Guard/Touching assist Assistive device: Walker-rolling   Walk 50 feet activity   Assist Walk 50 feet with 2 turns activity did not occur: Safety/medical concerns  Assist level: 2 helpers (CGA & +2 w/c follow) Assistive device: Walker-rolling    Walk 150 feet activity   Assist Walk 150 feet activity did not occur: Safety/medical concerns         Walk 10 feet on uneven surface  activity   Assist Walk 10 feet on uneven surfaces activity did not occur: Safety/medical concerns         Wheelchair     Assist Will patient use wheelchair at discharge?: Yes Type of Wheelchair: Manual Wheelchair activity did not occur: Safety/medical concerns  Wheelchair assist level: Maximal Assistance - Patient 25 - 49% Max wheelchair distance: 5    Wheelchair 50 feet with 2 turns activity    Assist    Wheelchair 50 feet with 2 turns activity did not occur: Safety/medical concerns       Wheelchair 150 feet activity     Assist  Wheelchair 150 feet activity did not occur: Safety/medical concerns       Blood pressure 124/66, pulse 85, temperature 98.5 F (36.9 C), temperature source Oral, resp. rate 20, height 5\' 3"  (1.6 m), weight (!) 203.8 kg, SpO2 96 %.   Medical Problem List and Plan: 1.  Debility secondary to panniculitis and SIRS  -con't PT and OT- strongly encouraged pt she needs to work with therapy as much as possible   D/c? Looks like pt wasn't discharged yesterday- will see if can arrange today, but not sure. Done with PT and OT- discharged,  2.  Antithrombotics: -DVT/anticoagulation:  Pharmaceutical: Lovenox             -antiplatelet therapy: N/A 3. Chronic back pain/Pain Management: Used Mobic and robaxin. Continue tylenol prn   5/30- denies pain-  con't tylenol prn- NO NSAIDs per renal  6/2- having new onset R knee pain. Doesn't appear to be gout based on clinical exam- will try Tramadol 50 mg q6 hours prn for pain  6/3- pt didn't mention knee pain today? Didn't try tramadol?  6/6- Pain an issue- start voltaren gel QID for R knee; con't tramadol and order R knee brace?will try.  6/7- couldn't get R knee injection- wouldn't do transfer; got R knee brace; con't tramadol and voltaren gel   6/9- con't tramadol and voltaren gel- will need to get refills from PCP 4. Mood: LCSW to follow for evaluation and support.              -  antipsychotic agents: N/A  -provided encouragement, discussed trying to focus on positives in her life given severe depression/anxiety over her social issues/home environment  6/7- focused on her and getting a place to go to- her minister, per pt, is going to get her into Motel- and then maybe apartment- she cannot sign a lease, because she's been evicted.  5. Neuropsych: This patient is capable of making decisions on her own behalf. 6. Skin/Wound Care: Routine pressure relief measures.              --Continue Silvadene and Interdry per WOC recommendations.              --is Malnourished with albumin 2.1.  Added collagen and protein to promote wound healing             --Add vitamin C, Zinc and multivitamin as supplements.    6/2- Albumin up to 2.4!- continue regimen 7. Fluids/Electrolytes/Nutrition: Monitor I/O.  8. Acute on chronic renal failure with urinary retention:   Creatinine 1.47 on 5/20, labs ordered for Monday  5/23- Cr up to 1.86- will encourage lfuids and recheck Wednesday  5/25- Cr up to 2.14- will start IVFs 75cc/hr NS and recheck in AM- if not much better, will start Foley since likely post obstructive nephropathy, esp since cath volume after voiding was 450cc-  5/26- will place foley and add Flomax to get pt to void   5/29 BUN/Cr demonstrating improvement. appreciate nephrology consult for AKI, continue  per recs   -continue foley   5/30- will d/c foley but recheck labs Thursday to see if retaining/Cr up?- d/c IVFs Cr down to 1.51  66/2- Cr down to 1.40- so likely no post obstructive nephropathy.   6/6- Cr up very slightly to 1.47- con't regimen  6/7- will recheck weekly.   6/8- Cr down slightly to 1.36- con't regimen 9. Hyponatremia: Will continue to monitor. Question dilutional   Sodium 133 on 5/20, labs ordered for Monday    Na up to 137- con't to monitor  6/2- Na 135- con't regimen 10. Anemia of chronic diease: Monitor for signs of bleeding. H/H has been 7-8 range.   Hemoglobin 8.7 on 5/20  5/27- Hb 8.2- con't ot monitor, repeat Monday 11. COPD/OHS/OSA: Has declined CPAP use. Encourage pulmonary hygeine. On Singulair.  5/31- refusing CPAP! Encouraged her to use.  6/3- pt actually wore CPAP last night per documentation   6/9- refusing.  12. Schizoaffective bipolar d/o: Continue Wellbutrin with cymbalta and buspar.  6/3- on max dose of Duloxetine for mood; don't want to change meds due to complicated dx.  13. RLS: On Requip.  80. Super super obesity:  Educate on appropriate diet, self care and weight loss to help promote mobility and overall health.  5/23- weight down some to 204.6 kg- con't hospital diet.   5/31- will have staff weigh patient on stnading scale with therapy and document- order placed.  6/1- Weight down 1 kg to 203.8 kg from 204. 9 kg- in 1 week.  6/7- will have pt weighed   15. Chronic Insomnia: Seroquel started a month ago-caused dizziness and ineffective  5/20- will try trazodone 25 mg q8pm  5/24- sleeping much better- con't regimen  Appears to be improving 16. Renal mass: Was set to see MD at [facility] urology--had to be rescheduled.  17. Panniculitis- with open weeping wounds- will con't WOC recs with interdry and wound care- might need some Diflucan to help with yeast overgrowth.   Diflucan x 1 week (  5/26)  5/30- no significant change- con't regimen 18.  Dizziness/lightheadedness-   Improving 19. ?Edema?  5/23- I don't see actual edema on exam- tissues soft, nonpitting- will wait on any diuretics, esp because Cr up to 1.86  20. Pt asked for COVID vaccine- will give today  5/24- received vaccine- has nausea- no body aches- will see if from COVID vaccine?  6/7- not due until 6/13! 21. Constipation  5/23- will add Senokot 2 tabs daily-   6/2- going daily now- encouraged pt to ask for miralax if gets constipated with tramadol.  22. Nausea-   5/24- will check KUB and also give compazine prn- has very quiet bowel sounds.    5/25- KUB (-)  5/31- resolved 23. UTI  5/25- not sure which came first- UTI and retention or retention causing UTI- anyway, starting Bactrim since allergic to PCN- anaphylaxis- also IVFs NS 75cc/hr and will recheck labs in AM   5/26- con't IVFs 75cc/hour and push fluids PO- also adding Flomax to get pt to void hopefully- and add foley since retaining- due to body habitus, nursing cannot cath regularly.  5/27-29- Bactrim changed to fosfomycin per Pharmacy- was resistant to bactrim- WBC down to 6.4k --avoid bactrim per nephrology recs  5/30- resolved.  5/31- foley out and drinking well   6/6- pt has leukocytosis again WBC 11.8- likely has another UTI and cannot scan her bladder due to habitus, so therefore, will check another U/A and start Macrobid chronically?  6/7- will recheck labs in AM and if still elevated, will recheck U/A and Cx again.   6/8- WBC down slightly to 11.5- also no Left shift, no Sx's and no fever- will not send out on ABX 24. Asthma  5/26- will satrt Pulmicort middle dose BID and albuterol inhalers prn per pt's home meds  5/27- d/w nursing- need to make sure doesn't miss pulmicort 25. Losing voice  5/26- will monitor- not painful- could be from allergies combined with vomiting affecting vocal cords?  5/30- is back to normal/resolved 26. Dispo  6/1- will see if neuropsych vs SW can talk to her today.    6/3- scheduled to see Neuropysch today  27. Dispo  6/3- will see if possible to get CNA care after d/c?  6/6- explained to pt it's not SW job to find her a place, which she suggested; She needs to find her a place; and usually pt's out by 30 days.  6/7- pt says minister going to get "her a place".   6/8- pt has debility- doesn't need to see [place] in f/u- does need to make f/u with PCP ASAP to make sure gets refills for meds. 6/9- trying to arrange for transport- don't know if pt can leave tomorrow- medically, pt is stable- will need to see PCP a d/c to follow up on wounds on abdomen; as well as overall health and pain meds.     LOS: 21 days A FACE TO FACE EVALUATION WAS PERFORMED  Razia Screws 09/14/2020, 8:55 AM

## 2020-09-20 ENCOUNTER — Telehealth: Payer: Self-pay

## 2020-09-20 NOTE — Telephone Encounter (Signed)
Heather [name], PT from [facility] called requesting HHPT 1wk1, 2wk2, 1wk3 and request for SW eval. Approval given.

## 2020-10-02 ENCOUNTER — Other Ambulatory Visit: Payer: Self-pay

## 2020-10-02 ENCOUNTER — Encounter (HOSPITAL_COMMUNITY): Payer: Self-pay

## 2020-10-02 ENCOUNTER — Telehealth (INDEPENDENT_AMBULATORY_CARE_PROVIDER_SITE_OTHER): Payer: Medicaid Other | Admitting: Psychiatry

## 2020-10-02 DIAGNOSIS — F331 Major depressive disorder, recurrent, moderate: Secondary | ICD-10-CM | POA: Diagnosis not present

## 2020-10-02 DIAGNOSIS — F4310 Post-traumatic stress disorder, unspecified: Secondary | ICD-10-CM | POA: Diagnosis not present

## 2020-10-02 MED ORDER — GABAPENTIN 100 MG PO CAPS: 100.0000 mg | ORAL_CAPSULE | Freq: Three times a day (TID) | ORAL | 2 refills | Status: DC

## 2020-10-02 MED ORDER — BUPROPION HCL ER (SR) 100 MG PO TB12: 200.0000 mg | ORAL_TABLET | Freq: Two times a day (BID) | ORAL | 2 refills | Status: DC

## 2020-10-02 MED ORDER — TRAZODONE HCL 50 MG PO TABS: 25.0000 mg | ORAL_TABLET | Freq: Every day | ORAL | 2 refills | Status: DC

## 2020-10-02 MED ORDER — BUSPIRONE HCL 30 MG PO TABS: 15.0000 mg | ORAL_TABLET | Freq: Every day | ORAL | 2 refills | Status: DC

## 2020-10-02 MED ORDER — DULOXETINE HCL 20 MG PO CPEP: 20.0000 mg | ORAL_CAPSULE | Freq: Every day | ORAL | 0 refills | Status: DC
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
## 2020-10-24 ENCOUNTER — Emergency Department (HOSPITAL_COMMUNITY): Payer: Medicaid Other

## 2020-10-24 ENCOUNTER — Encounter (HOSPITAL_COMMUNITY): Payer: Self-pay | Admitting: *Deleted

## 2020-10-24 ENCOUNTER — Other Ambulatory Visit: Payer: Self-pay

## 2020-10-24 ENCOUNTER — Emergency Department (HOSPITAL_COMMUNITY)
Admission: EM | Admit: 2020-10-24 | Discharge: 2020-10-24 | Disposition: A | Discharge status: Home / Self Care | Payer: Medicaid Other | Attending: Emergency Medicine | Admitting: Emergency Medicine

## 2020-10-24 DIAGNOSIS — G8929 Other chronic pain: Secondary | ICD-10-CM | POA: Insufficient documentation

## 2020-10-24 DIAGNOSIS — R0789 Other chest pain: Secondary | ICD-10-CM | POA: Insufficient documentation

## 2020-10-24 DIAGNOSIS — N200 Calculus of kidney: Secondary | ICD-10-CM | POA: Diagnosis not present

## 2020-10-24 DIAGNOSIS — Z765 Malingerer [conscious simulation]: Secondary | ICD-10-CM

## 2020-10-24 DIAGNOSIS — E119 Type 2 diabetes mellitus without complications: Secondary | ICD-10-CM | POA: Insufficient documentation

## 2020-10-24 DIAGNOSIS — Z91040 Latex allergy status: Secondary | ICD-10-CM | POA: Diagnosis not present

## 2020-10-24 DIAGNOSIS — J449 Chronic obstructive pulmonary disease, unspecified: Secondary | ICD-10-CM | POA: Diagnosis not present

## 2020-10-24 DIAGNOSIS — Z7951 Long term (current) use of inhaled steroids: Secondary | ICD-10-CM | POA: Insufficient documentation

## 2020-10-24 DIAGNOSIS — N9489 Other specified conditions associated with female genital organs and menstrual cycle: Secondary | ICD-10-CM | POA: Insufficient documentation

## 2020-10-24 DIAGNOSIS — I10 Essential (primary) hypertension: Secondary | ICD-10-CM | POA: Diagnosis not present

## 2020-10-24 DIAGNOSIS — J4531 Mild persistent asthma with (acute) exacerbation: Secondary | ICD-10-CM | POA: Diagnosis not present

## 2020-10-24 DIAGNOSIS — M545 Low back pain, unspecified: Secondary | ICD-10-CM | POA: Diagnosis present

## 2020-10-24 DIAGNOSIS — N390 Urinary tract infection, site not specified: Secondary | ICD-10-CM | POA: Diagnosis not present

## 2020-10-24 LAB — TROPONIN I (HIGH SENSITIVITY)
Troponin I (High Sensitivity): 6 ng/L (ref <18)
Troponin I (High Sensitivity): 7 ng/L (ref <18)

## 2020-10-24 LAB — COMPREHENSIVE METABOLIC PANEL
ALT: 28 U/L (ref 0–44)
AST: 37 U/L (ref 15–41)
Albumin: 3.4 g/dL — ABNORMAL LOW (ref 3.5–5.0)
Alkaline Phosphatase: 67 U/L (ref 38–126)
Anion gap: 14 (ref 5–15)
BUN: 10 mg/dL (ref 6–20)
CO2: 21 mmol/L — ABNORMAL LOW (ref 22–32)
Calcium: 9.7 mg/dL (ref 8.9–10.3)
Chloride: 103 mmol/L (ref 98–111)
Creatinine, Ser: 1.61 mg/dL — ABNORMAL HIGH (ref 0.44–1.00)
GFR, Estimated: 41 mL/min — ABNORMAL LOW (ref >60)
Glucose, Bld: 106 mg/dL — ABNORMAL HIGH (ref 70–99)
Potassium: 3.6 mmol/L (ref 3.5–5.1)
Sodium: 138 mmol/L (ref 135–145)
Total Bilirubin: 0.7 mg/dL (ref 0.3–1.2)
Total Protein: 8.1 g/dL (ref 6.5–8.1)

## 2020-10-24 LAB — I-STAT BETA HCG BLOOD, ED (MC, WL, AP ONLY): I-stat hCG, quantitative: <5 m[IU]/mL (ref <5)

## 2020-10-24 LAB — CBC WITH DIFFERENTIAL/PLATELET
Abs Immature Granulocytes: 0.05 10*3/uL (ref 0.00–0.07)
Basophils Absolute: 0.1 10*3/uL (ref 0.0–0.1)
Basophils Relative: 0 %
Eosinophils Absolute: 0.4 10*3/uL (ref 0.0–0.5)
Eosinophils Relative: 3 %
HCT: 45.6 % (ref 36.0–46.0)
Hemoglobin: 13.1 g/dL (ref 12.0–15.0)
Immature Granulocytes: 0 %
Lymphocytes Relative: 21 %
Lymphs Abs: 2.9 10*3/uL (ref 0.7–4.0)
MCH: 25.1 pg — ABNORMAL LOW (ref 26.0–34.0)
MCHC: 28.7 g/dL — ABNORMAL LOW (ref 30.0–36.0)
MCV: 87.5 fL (ref 80.0–100.0)
Monocytes Absolute: 0.8 10*3/uL (ref 0.1–1.0)
Monocytes Relative: 5 %
Neutro Abs: 10.1 10*3/uL — ABNORMAL HIGH (ref 1.7–7.7)
Neutrophils Relative %: 71 %
Platelets: 622 10*3/uL — ABNORMAL HIGH (ref 150–400)
RBC: 5.21 MIL/uL — ABNORMAL HIGH (ref 3.87–5.11)
RDW: 19.9 % — ABNORMAL HIGH (ref 11.5–15.5)
WBC: 14.3 10*3/uL — ABNORMAL HIGH (ref 4.0–10.5)
nRBC: 0 % (ref 0.0–0.2)

## 2020-10-24 LAB — URINALYSIS, ROUTINE W REFLEX MICROSCOPIC
Bilirubin Urine: NEGATIVE
Glucose, UA: NEGATIVE mg/dL
Ketones, ur: NEGATIVE mg/dL
Nitrite: NEGATIVE
Protein, ur: 100 mg/dL — AB
Specific Gravity, Urine: 1.01 (ref 1.005–1.030)
WBC, UA: >50 WBC/hpf — ABNORMAL HIGH (ref 0–5)
pH: 6 (ref 5.0–8.0)

## 2020-10-24 MED ORDER — METHOCARBAMOL 500 MG PO TABS: 500.0000 mg | ORAL_TABLET | Freq: Two times a day (BID) | ORAL | 0 refills | Status: DC | PRN

## 2020-10-24 MED ORDER — FOSFOMYCIN TROMETHAMINE 3 G PO PACK
3.0000 g | PACK | Freq: Once | ORAL | Status: AC
  Administered 2020-10-24: 3 g via ORAL
  Filled 2020-10-24: qty 3

## 2020-10-24 MED ORDER — DICLOFENAC SODIUM 1 % EX GEL: 2.0000 g | Freq: Four times a day (QID) | CUTANEOUS | 0 refills | Status: DC

## 2020-10-24 MED ORDER — METHOCARBAMOL 500 MG PO TABS
1000.0000 mg | ORAL_TABLET | Freq: Once | ORAL | Status: AC
  Administered 2020-10-24: 1000 mg via ORAL
  Filled 2020-10-24: qty 2

## 2020-10-24 MED ORDER — LIDOCAINE 5 % EX PTCH
1.0000 | MEDICATED_PATCH | CUTANEOUS | Status: DC
  Administered 2020-10-24: 1 via TRANSDERMAL
  Filled 2020-10-24: qty 1

## 2020-10-24 NOTE — ED Provider Notes (Signed)
Emergency Medicine Provider Triage Evaluation Note  Heather [name] , a 45 y.o. female  was evaluated in triage.  Pt complains of multiple concerns.  Patient homeless, has been staying in a motel but recently lost her money.  Chronic back pain now radiating down into her right leg, feels weak when walking with her walker.  Additionally endorses worsening right lower abdominal pain in the pannus with associated nausea and poor p.o. tolerance due to nausea and NBNB emesis.  Denies any diarrhea, melena, hematochezia but does endorse dysuria and darkening of her urine color.  Patient is tearful and fairly poor historian.  Review of Systems  Positive: Abdominal pain, nausea, back pain, leg pain, chest pain, shortness of breath Negative: Palpitations, diarrhea, urinary frequency urgency  Physical Exam  SpO2 98%  Gen:   Awake, intermittently tearful in triage, labile affect Resp:  Normal effort  MSK:   Moves extremities without difficulty  Other:  RRR no M/R/G.  Lungs CTA B.  Tenderness palpation of the right lower abdomen/pannus, TTP the midline lumbar area.  Medical Decision Making  Medically screening exam initiated at 2:05 PM.  Appropriate orders placed.  Heather [name] was informed that the remainder of the evaluation will be completed by another provider, this initial triage assessment does not replace that evaluation, and the importance of remaining in the ED until their evaluation is complete.  This chart was dictated using voice recognition software, Dragon. Despite the best efforts of this provider to proofread and correct errors, errors may still occur which can change documentation meaning.    Aura Dials 10/24/20 1408    Heather Ferguson, MD 10/26/20 1004

## 2020-10-24 NOTE — Progress Notes (Signed)
CSW called Pt's church friend Jerold Coombe in an effort to secure more funds for housing, Ms. Abbott states she cannot give funds without authorization from [employer] whom CSW attempted to reach via phone earlier. CSW attempted Mr. Mikle Bosworth again to no avail. CSW also attempted a previous [employer], at Pt's request, CSW left voicemail. Pt called her mother who sates that she could not pay for another night at [facility]. Mother states that Pt's walker is still at [place] called [facility]man, front desk attendant states that room has not been cleaned yet and walker should still be inside room 414.   Pt will d/c to lobby.  CSW will leave handoff for 1st shift CSW to assist in having walker brought to ED so that Pt may be discharged in morning once walker arrives.

## 2020-10-24 NOTE — ED Notes (Signed)
Pt ambulated to and from the restroom with walker independently.

## 2020-10-24 NOTE — ED Triage Notes (Signed)
Pt arrived by [facility] from home for back pain and bilateral lower leg pain for several days.

## 2020-10-24 NOTE — Social Work (Signed)
CSW met with Pt at bedside to discuss possible options should Pt be d/c'ed this evening.  Per Pt she has been staying in the [facility] since discharging form previous inpatient stay on 09/14/20.  Per Pt she spoke with someone at [facility] yesterday about getting a bed there for long-term nursing care as she feels that she is unable to care for herself.  Pt also states that she has been approved for unemployment but the check has not hit account yet. Pt reports that she has a virtual disability appointment on 11/15/20 as well.  Pt's housing was paid for by church donations and other donations up until today when the funding ran out. Pt has shelter list and states that she has called several shelters. With Pt's verbal permission, CSW attempted to reach Pt's Doristine Bosworth and contact, Kevin Fenton @ [phone] in an effort to ascertain if there was any funding available for another night in the motel. CSW  left a HIPAA compliant voicemail.

## 2020-10-24 NOTE — Discharge Instructions (Addendum)
Call your primary care doctor to help with placement in a skilled nursing facility. Continue taking home medication as prescribed. Use the Voltaren gel as needed for breakthrough pain. Use Robaxin as needed for stiffness or soreness.  Have caution, this may make you tired or groggy.  Do not drive or operate heavy machinery while taking this medicine. Call the urologist listed below to set up a follow-up appointment for further evaluation of your kidney. Return to the emergency room with any new, worsening, concerning symptoms

## 2020-10-24 NOTE — ED Notes (Signed)
Patient verbalizes understanding of discharge instructions. Prescriptions and follow-up care reviewed. Opportunity for questioning and answers were provided. Armband removed by staff, pt discharged from ED via wheelchair. Per Social Work, pt to be d/c to lobby until Deliah Boston can get her walker from the hotel room and bring it to her so she can be d/c home. Pt in lobby at this time. Staff made aware of pt situation.

## 2020-10-24 NOTE — ED Provider Notes (Addendum)
[facility] EMERGENCY DEPARTMENT Provider Note   CSN: [id number] Arrival date & time: 10/24/20  1339     History Chief Complaint  Patient presents with   Back Pain    Heather [name] is a 45 y.o. female presenting for evaluation of worsening back pain and chest pain.  Patient states she has had worsening back pain today ever since she tried to walk without her walker.  She did not fall or have any injury.  She states pain is in her low back and goes down into both legs.  She does have chronic back pain, this is being treated with tramadol.  She also reports chest tightness and pain, states this is not consistent with her normal anxiety. She states she has not urinated much today, but no dysuria or hematuria. No fever, sob, cough, abnormal BMs.   Additional history obtained in chart reviewed.  Patient with a history of anxiety, chronic pain, depression, diabetes, obesity, schizoaffective disorder bipolar type.  Patient also with significant social barriers including that she has been living in a hotel for the past 2 months that her church has been paying for.  When she was admitted 2 months ago, she was offered SNF placement but declined at that time.  Patient states she now has no money to stay at the hotel, and has nowhere to go.   HPI     Past Medical History:  Diagnosis Date   Allergy    Amenorrhea    Anemia    post partum    Anxiety    Anxiety    Arthritis    Asthma    Back pain    Constipation    COPD (chronic obstructive pulmonary disease) ([place])    Delivery with history of C-section    Depression    Depression    Diabetes mellitus without complication ([place])    patient denies but states she has hyperglycemia-diet controlled   Dysmenorrhea    Dysrhythmia    DR Johnsie Cancel     Ectopic pregnancy 2013   Edema, lower extremity    Eosinophilic esophagitis    Diagnosed at [facility] 06/16/2013, untreated   Gallbladder problem    GERD (gastroesophageal  reflux disease)    HEARTBURN   TUMS   Hard to intubate 11/07/2015   High cholesterol    IBS (irritable bowel syndrome)    Leukocytosis 07/28/2008   Qualifier: Diagnosis of  By: Jonna Munro MD, Cornelius     Morbid obesity [facility])    Neuromuscular disorder ([place])    RESTLESS LEG    Obesity    Schizoaffective disorder, bipolar type ([place])    Sepsis ([place]) 11/11/2014   Shortness of breath    WITH EXERTION    Sleep apnea    CPAP- in process of restarting     Patient Active Problem List   Diagnosis Date Noted   Major depression, chronic 09/13/2020   Insomnia    Anemia of chronic disease    Hyponatremia    AKI (acute kidney injury) ([place])    Chronic bilateral low back pain without sciatica    Debility 08/24/2020   SIRS (systemic inflammatory response syndrome) ([place]) 08/18/2020   Pressure injury of skin 08/11/2020   Rhabdomyolysis 08/10/2020   Acute renal failure ([place]) 08/10/2020   PTSD (post-traumatic stress disorder) 05/15/2020   Major depressive disorder, recurrent episode, moderate ([place]) 05/05/2020   Generalized anxiety disorder 05/05/2020   Blood in stool    Gastritis and gastroduodenitis  Benign neoplasm of sigmoid colon    Difficult intubation 08/10/2018   Class 3 severe obesity with serious comorbidity and body mass index (BMI) greater than or equal to 70 in adult [facility]) 06/16/2018   Nexplanon insertion 03/27/2017   Postpartum hypertension 03/05/2017   Status post primary low transverse cesarean section 02/03/2017   H/O pre-eclampsia 01/27/2017   Gestational htn w/o significant proteinuria, third trimester 01/20/2017   Mild persistent asthma without complication 15/72/6203   Perennial allergic rhinitis 11/05/2016   Mild persistent asthma with acute exacerbation 11/05/2016   Excess weight gain in pregnancy, second trimester 10/30/2016   Hard to intubate 11/07/2015   Hypoglycemia 11/07/2015   OSA (obstructive sleep apnea) 11/07/2015   DM type 2 (diabetes mellitus, type 2) ([place])  12/07/2014   Panniculitis 12/06/2014   Cellulitis, abdominal wall 11/11/2014   Abdominal pain 07/14/2014   Loose stools 07/14/2014   Melena 55/97/4163   Eosinophilic esophagitis 84/53/6468   Change in bowel habits 04/28/2013   Esophageal dysphagia 04/28/2013   Insomnia due to mental disorder(327.02) 08/08/2011   RLS (restless legs syndrome) 08/08/2011   PALPITATIONS, OCCASIONAL 11/01/2009   DISORDER, TOBACCO USE 08/25/2008   Leucocytosis 07/28/2008   ALLERGIC RHINITIS, SEASONAL 06/27/2246   DYSMETABOLIC SYNDROME 25/00/3704   Morbid obesity ([place]) 05/13/2006   EXTERNAL HEMORRHOIDS 05/13/2006   HYPERLIPIDEMIA 05/12/2006   Essential hypertension 05/12/2006   ASTHMA 05/12/2006   GERD (gastroesophageal reflux disease) 05/12/2006   OSTEOARTHRITIS 05/12/2006    Past Surgical History:  Procedure Laterality Date   BIOPSY  08/13/2018   Procedure: BIOPSY;  Surgeon: Yetta Flock, MD;  Location: [facility];  Service: Gastroenterology;;   CESAREAN SECTION MULTI-GESTATIONAL N/A 02/03/2017   Procedure: CESAREAN SECTION MULTI-GESTATIONAL;  Surgeon: Jonnie Kind, MD;  Location: [place];  Service: Obstetrics;  Laterality: N/A;   CHOLECYSTECTOMY     COLONOSCOPY WITH PROPOFOL N/A 08/13/2018   Procedure: COLONOSCOPY WITH PROPOFOL;  Surgeon: Yetta Flock, MD;  Location: [facility];  Service: Gastroenterology;  Laterality: N/A;   DENTAL SURGERY     ESOPHAGOGASTRODUODENOSCOPY  May 2007   Dr. Gala Romney: Normal esophagus, stomach, D1, D2   ESOPHAGOGASTRODUODENOSCOPY  06/16/2013   Dr. Carlton Adam, eosinophilic esophagitis, reactive gastropathy, no esophageal dilation   ESOPHAGOGASTRODUODENOSCOPY (EGD) WITH PROPOFOL N/A 08/13/2018   Procedure: ESOPHAGOGASTRODUODENOSCOPY (EGD) WITH PROPOFOL;  Surgeon: Yetta Flock, MD;  Location: [facility];  Service: Gastroenterology;  Laterality: N/A;   POLYPECTOMY  08/13/2018   Procedure: POLYPECTOMY;  Surgeon: Yetta Flock, MD;   Location: [facility];  Service: Gastroenterology;;   TONSILLECTOMY     TOOTH EXTRACTION  10/28/2011   Procedure: DENTAL RESTORATION/EXTRACTIONS;  Surgeon: Gae Bon, DDS;  Location: [facility];  Service: Oral Surgery;;   UPPER GASTROINTESTINAL ENDOSCOPY       OB History     Gravida  2   Para  1   Term  1   Preterm      AB  1   Living  2      SAB  0   IAB      Ectopic  1   Multiple  1   Live Births  2           Family History  Problem Relation Age of Onset   Depression Mother    Anxiety disorder Mother    High blood pressure Mother    Bipolar disorder Mother    Eating disorder Mother    Obesity Mother    Hypertension Sister  Allergic rhinitis Sister    Colon polyps Maternal Grandmother        48s   Diabetes Maternal Grandmother    Anxiety disorder Maternal Grandmother    COPD Maternal Grandmother    Crohn's disease Maternal Aunt    Cancer Maternal Grandfather        prostate   HIV/AIDS Father    Eating disorder Father    Obesity Father    Liver disease Neg Hx    Angioedema Neg Hx    Eczema Neg Hx    Immunodeficiency Neg Hx    Asthma Neg Hx    Urticaria Neg Hx    Colon cancer Neg Hx    Esophageal cancer Neg Hx    Rectal cancer Neg Hx    Stomach cancer Neg Hx     Social History   Tobacco Use   Smoking status: Former    Packs/day: 0.50    Years: 8.00    Pack years: 4.00    Types: Cigarettes    Quit date: 04/25/2011    Years since quitting: 9.5   Smokeless tobacco: Never  Vaping Use   Vaping Use: Never used  Substance Use Topics   Alcohol use: No   Drug use: No    Home Medications Prior to Admission medications   Medication Sig Start Date End Date Taking? Authorizing Provider  diclofenac Sodium (VOLTAREN) 1 % GEL Apply 2 g topically 4 (four) times daily. 10/24/20  Yes Setareh Rom, PA-C  methocarbamol (ROBAXIN) 500 MG tablet Take 1 tablet (500 mg total) by mouth 2 (two) times daily as needed for muscle spasms. 10/24/20  Yes  Keidra Withers, PA-C  acetaminophen (TYLENOL) 325 MG tablet Take 650 mg by mouth every 6 (six) hours as needed for mild pain, fever or headache.    [provider]  albuterol (VENTOLIN HFA) 108 (90 Base) MCG/ACT inhaler INHALE 2 PUFFS INTO THE LUNGS EVERY 4 HOURS AS NEEDED FOR WHEEZING OR SHORTNESS OF BREATH Patient taking differently: Inhale 2 puffs into the lungs every 4 (four) hours as needed for wheezing or shortness of breath. 12/02/19   Valentina Shaggy, MD  ascorbic acid (VITAMIN C) 500 MG tablet Take 1 tablet (500 mg total) by mouth daily. 09/14/20   Love, Ivan Anchors, PA-C  buPROPion (WELLBUTRIN SR) 100 MG 12 hr tablet Take 2 tablets (200 mg total) by mouth 2 (two) times daily. 10/02/20 10/02/21  Patrecia Pour, NP  busPIRone (BUSPAR) 30 MG tablet Take 0.5 tablets (15 mg total) by mouth daily. 10/02/20 11/01/20  Patrecia Pour, NP  Cyanocobalamin (VITAMIN B-12) 5000 MCG TBDP Take 5,000 mcg by mouth daily.    [provider]  DULoxetine (CYMBALTA) 20 MG capsule Take 1 capsule (20 mg total) by mouth daily. 10/02/20 11/01/20  Patrecia Pour, NP  fluticasone (FLONASE) 50 MCG/ACT nasal spray Place 1 spray into both nostrils in the morning and at bedtime.    [provider]  fluticasone (FLOVENT HFA) 110 MCG/ACT inhaler Inhale 2 puffs into the lungs 2 (two) times daily. 06/02/19   Valentina Shaggy, MD  folic acid (FOLVITE) 1 MG tablet Take 1 tablet (1 mg total) by mouth daily. 08/24/20 11/22/20  Antonieta Pert, MD  gabapentin (NEURONTIN) 100 MG capsule Take 1 capsule (100 mg total) by mouth 3 (three) times daily. 10/02/20 10/02/21  Patrecia Pour, NP  montelukast (SINGULAIR) 10 MG tablet Take 1 tablet (10 mg total) by mouth at bedtime. 08/18/20   Dwyane Dee,  MD  Multiple Vitamin (MULTIVITAMIN WITH MINERALS) TABS tablet Take 1 tablet by mouth daily. 09/14/20   Love, Ivan Anchors, PA-C  rOPINIRole (REQUIP) 3 MG tablet TAKE 1 TABLET(3 MG) BY MOUTH AT BEDTIME 08/18/20   Patrecia Pour, NP  senna-docusate (SENOKOT-S) 8.6-50 MG tablet Take 2 tablets by mouth daily. 09/14/20   Love, Ivan Anchors, PA-C  silver sulfADIAZINE (SILVADENE) 1 % cream Apply topically 2 (two) times daily. 09/13/20   Love, Ivan Anchors, PA-C  tamsulosin (FLOMAX) 0.4 MG CAPS capsule Take 1 capsule (0.4 mg total) by mouth daily after supper. 09/13/20   Love, Ivan Anchors, PA-C  traMADol (ULTRAM) 50 MG tablet Take 1 tablet (50 mg total) by mouth every 6 (six) hours as needed for severe pain. 09/13/20   Love, Ivan Anchors, PA-C  traZODone (DESYREL) 50 MG tablet Take 0.5 tablets (25 mg total) by mouth at bedtime. 10/02/20   Patrecia Pour, NP  Zinc Sulfate 220 (50 Zn) MG TABS Take 1 tablet (220 mg total) by mouth daily. 09/14/20   Love, Ivan Anchors, PA-C    Allergies    Bee venom, Penicillins, Penicillin g, Adhesive [tape], Latex, and Vancomycin  Review of Systems   Review of Systems  Cardiovascular:  Positive for chest pain.  Gastrointestinal:  Positive for abdominal pain.  Musculoskeletal:  Positive for back pain.  Psychiatric/Behavioral:  The patient is nervous/anxious.   All other systems reviewed and are negative.  Physical Exam Updated Vital Signs BP 129/87 (BP Location: Left Arm)   Pulse (!) 107   Temp 98.1 F (36.7 C)   Resp 18   SpO2 98%   Physical Exam Vitals and nursing note reviewed.  Constitutional:      General: She is not in acute distress.    Appearance: Normal appearance. She is obese.     Comments: Appears disheveled   HENT:     Head: Normocephalic and atraumatic.  Eyes:     Conjunctiva/sclera: Conjunctivae normal.     Pupils: Pupils are equal, round, and reactive to light.  Cardiovascular:     Rate and Rhythm: Normal rate and regular rhythm.     Pulses: Normal pulses.  Pulmonary:     Effort: Pulmonary effort is normal. No respiratory distress.     Breath sounds: Normal breath sounds. No wheezing.     Comments: Speaking in full sentences.  Clear lung sounds in all fields. Abdominal:      General: There is no distension.     Palpations: Abdomen is soft. There is no mass.     Tenderness: There is no abdominal tenderness. There is no guarding or rebound.     Comments: Chronic skin changes/darkening of the lower pannus.  Small wounds without active bleeding or discharge.  Do not appear acutely infected.  Similar to previous per chart review.  Musculoskeletal:        General: Tenderness present. Normal range of motion.     Cervical back: Normal range of motion and neck supple.     Comments: Diffuse tenderness palpation of the entire back without focal pain.  Patient able to go from laying to sitting without significant difficulty.  Skin:    General: Skin is warm and dry.     Capillary Refill: Capillary refill takes less than 2 seconds.  Neurological:     Mental Status: She is alert and oriented to person, place, and time.  Psychiatric:        Mood and Affect: Mood normal. Affect is  tearful.        Speech: Speech normal.        Behavior: Behavior normal.    ED Results / Procedures / Treatments   Labs (all labs ordered are listed, but only abnormal results are displayed) Labs Reviewed  COMPREHENSIVE METABOLIC PANEL - Abnormal; Notable for the following components:      Result Value   CO2 21 (*)    Glucose, Bld 106 (*)    Creatinine, Ser 1.61 (*)    Albumin 3.4 (*)    GFR, Estimated 41 (*)    All other components within normal limits  CBC WITH DIFFERENTIAL/PLATELET - Abnormal; Notable for the following components:   WBC 14.3 (*)    RBC 5.21 (*)    MCH 25.1 (*)    MCHC 28.7 (*)    RDW 19.9 (*)    Platelets 622 (*)    Neutro Abs 10.1 (*)    All other components within normal limits  URINALYSIS, ROUTINE W REFLEX MICROSCOPIC - Abnormal; Notable for the following components:   Color, Urine AMBER (*)    APPearance TURBID (*)    Hgb urine dipstick SMALL (*)    Protein, ur 100 (*)    Leukocytes,Ua LARGE (*)    WBC, UA >50 (*)    Bacteria, UA FEW (*)    Non Squamous  Epithelial 0-5 (*)    All other components within normal limits  URINE CULTURE  I-STAT BETA HCG BLOOD, ED ([facility], [facility], [facility] ONLY)  TROPONIN I (HIGH SENSITIVITY)  TROPONIN I (HIGH SENSITIVITY)    EKG EKG Interpretation  Date/Time:  Tuesday October 24 2020 18:20:00 EDT Ventricular Rate:  100 PR Interval:  158 QRS Duration: 154 QT Interval:  428 QTC Calculation: 552 R Axis:   47 Text Interpretation: Normal sinus rhythm Right bundle branch block Abnormal ECG No significant change since last tracing Confirmed by Deno Etienne [phone]) on 10/24/2020 6:27:52 PM  Radiology CT ABDOMEN PELVIS WO CONTRAST  Result Date: 10/24/2020 CLINICAL DATA:  Abdominal pain, nausea/vomiting EXAM: CT ABDOMEN AND PELVIS WITHOUT CONTRAST TECHNIQUE: Multidetector CT imaging of the abdomen and pelvis was performed following the standard protocol without IV contrast. COMPARISON:  08/18/2020 FINDINGS: Lower chest: Lung bases are clear. Hepatobiliary: Severe hepatic steatosis. Status post cholecystectomy. No intrahepatic or extrahepatic ductal dilatation. Pancreas: Within normal limits. Spleen: Within normal limits. Adrenals/Urinary Tract: Adrenal glands are within normal limits. 8.3 cm right upper pole renal cyst, measuring simple fluid density, likely benign. Three nonobstructing right renal calculi, measuring up to 1.9 cm in the interpolar region (coronal image 34). Left lower pole renal staghorn calculi measuring up to 5.3 cm (coronal image 85). No hydronephrosis. Bladder is within normal limits. Stomach/Bowel: Stomach is notable for a tiny hiatal hernia. No evidence of bowel obstruction. Normal appendix (series 3/image 60). No colonic wall thickening or inflammatory changes. Vascular/Lymphatic: No evidence of abdominal aortic aneurysm. No suspicious abdominopelvic lymphadenopathy. Reproductive: Uterus is within normal limits. Bilateral ovaries are within normal limits. Other: No abdominopelvic ascites. Musculoskeletal: Grade 1  spondylolisthesis at L5-S1. Degenerative changes of the visualized thoracolumbar spine. IMPRESSION: Bilateral nonobstructing renal calculi, including a 5.3 cm left renal staghorn calculus. No hydronephrosis. Severe hepatic steatosis. Additional stable ancillary findings as above. Electronically Signed   By: Julian Hy M.D.   On: 10/24/2020 19:43   DG Chest 2 View  Result Date: 10/24/2020 CLINICAL DATA:  Chest tightness for 3 days. EXAM: CHEST - 2 VIEW COMPARISON:  Single-view of the chest 08/18/2020. PA  and lateral chest 06/22/2018. FINDINGS: Lungs clear. Heart size normal. No pneumothorax or pleural fluid. No acute or focal bony abnormality. IMPRESSION: Negative chest. Electronically Signed   By: Inge Rise M.D.   On: 10/24/2020 14:51    Procedures Procedures   Medications Ordered in ED Medications  lidocaine (LIDODERM) 5 % 1 patch (1 patch Transdermal Patch Applied 10/24/20 2015)  fosfomycin (MONUROL) packet 3 g (has no administration in time range)  methocarbamol (ROBAXIN) tablet 1,000 mg (1,000 mg Oral Given 10/24/20 2015)    ED Course  I have reviewed the triage vital signs and the nursing notes.  Pertinent labs & imaging results that were available during my care of the patient were reviewed by me and considered in my medical decision making (see chart for details).    MDM Rules/Calculators/A&P                           Patient presenting for evaluation of a myriad of complaints, mostly chronic complaints.  Patient reports acute on chronic back pain.  She states she is no longer able to stay in the hotel, where she has been staying.  I feel this is likely contributing to her symptoms.  She also reports chest pain, however no associated shortness of breath, nausea, vomiting, diaphoresis.  Low suspicion for ACS.  On exam, patient appears disheveled.  Abdominal exam is overall reassuring, there are some chronic skin changes, but exam is not consistent with acute panniculitis.   Patient with diffuse tenderness palpation of the entire back, without focal pain.  Low suspicion for infection, fracture, cauda equina syndrome, myelopathy.  However considering patient's medical history, will obtain labs, urine, chest x-ray, CT abdomen pelvis.  Labs interpreted by me, overall reassuring.  Kidney function at baseline.  Chest x-ray viewed and independently interpreted by me, no pneumonia, proximal, effusion.  CT abdomen pelvis negative for acute findings, does show a staghorn calculus which has been present previously.  Cardiac work-up is overall reassuring, EKG is nonischemic.  Troponins negative x2.  I feel a large component of patient symptoms is her social situation.  Patient's urine is similar to previous.  She does not have clear urinary symptoms, and I have low suspicion for acute infection.  However as she has had infections with positive cultures in the past, will treat with fosfomycin.  Culture is pending.  I do not feel this warrants admission at this time.  I discussed findings with patient.  Discussed that at this time, there is not a clear indication for admission and no emergent or life-threatening illness that requires admission.  Patient is requesting admission to behavioral health, however as she does not have an acute psychiatric condition requiring stabilization, discussed that she does not meet criteria for [facility] evaluation or admission.  Social work has been consulted and has discussed situation with patient, however there is not much available to her, resources for shelters have been given.  At this time, patient appears safe for discharge.  Return precautions given.   2256: After discharge, upon being told patient needed to vacate the bed, patient has now informed nurse that she is suicidal.  Patient does not have a specific plan, and she is clearly stating this for secondary gain. Discussed with attending, Dr. Tyrone Nine agrees to plan. Will have pt f/u outpatient. Concern  for malingering.  Final Clinical Impression(s) / ED Diagnoses Final diagnoses:  Chronic bilateral low back pain, unspecified whether sciatica present  Urinary tract infection without hematuria, site unspecified  Staghorn calculus    Rx / DC Orders ED Discharge Orders          Ordered    methocarbamol (ROBAXIN) 500 MG tablet  2 times daily PRN        10/24/20 2130    diclofenac Sodium (VOLTAREN) 1 % GEL  4 times daily        10/24/20 2130             Devyn Griffing, PA-C 10/24/20 2139    Deno Etienne, DO 10/24/20 2151    [place], Susana Duell, PA-C 10/24/20 [place], [place], DO 10/24/20 2305

## 2020-10-25 ENCOUNTER — Emergency Department (HOSPITAL_COMMUNITY)
Admission: EM | Admit: 2020-10-25 | Discharge: 2020-10-25 | Disposition: A | Discharge status: Home / Self Care | Payer: Medicaid Other | Attending: Emergency Medicine | Admitting: Emergency Medicine

## 2020-10-25 ENCOUNTER — Encounter (HOSPITAL_COMMUNITY): Payer: Self-pay | Admitting: Emergency Medicine

## 2020-10-25 DIAGNOSIS — Z91040 Latex allergy status: Secondary | ICD-10-CM | POA: Diagnosis not present

## 2020-10-25 DIAGNOSIS — J449 Chronic obstructive pulmonary disease, unspecified: Secondary | ICD-10-CM | POA: Insufficient documentation

## 2020-10-25 DIAGNOSIS — Z87891 Personal history of nicotine dependence: Secondary | ICD-10-CM | POA: Insufficient documentation

## 2020-10-25 DIAGNOSIS — E119 Type 2 diabetes mellitus without complications: Secondary | ICD-10-CM | POA: Insufficient documentation

## 2020-10-25 DIAGNOSIS — Z7952 Long term (current) use of systemic steroids: Secondary | ICD-10-CM | POA: Insufficient documentation

## 2020-10-25 DIAGNOSIS — J4530 Mild persistent asthma, uncomplicated: Secondary | ICD-10-CM | POA: Diagnosis not present

## 2020-10-25 DIAGNOSIS — N9489 Other specified conditions associated with female genital organs and menstrual cycle: Secondary | ICD-10-CM | POA: Insufficient documentation

## 2020-10-25 DIAGNOSIS — M545 Low back pain, unspecified: Secondary | ICD-10-CM | POA: Insufficient documentation

## 2020-10-25 DIAGNOSIS — R109 Unspecified abdominal pain: Secondary | ICD-10-CM | POA: Diagnosis not present

## 2020-10-25 DIAGNOSIS — I10 Essential (primary) hypertension: Secondary | ICD-10-CM | POA: Diagnosis not present

## 2020-10-25 DIAGNOSIS — N289 Disorder of kidney and ureter, unspecified: Secondary | ICD-10-CM

## 2020-10-25 LAB — COMPREHENSIVE METABOLIC PANEL
ALT: 27 U/L (ref 0–44)
AST: 34 U/L (ref 15–41)
Albumin: 3.5 g/dL (ref 3.5–5.0)
Alkaline Phosphatase: 71 U/L (ref 38–126)
Anion gap: 14 (ref 5–15)
BUN: 11 mg/dL (ref 6–20)
CO2: 20 mmol/L — ABNORMAL LOW (ref 22–32)
Calcium: 9.9 mg/dL (ref 8.9–10.3)
Chloride: 101 mmol/L (ref 98–111)
Creatinine, Ser: 1.79 mg/dL — ABNORMAL HIGH (ref 0.44–1.00)
GFR, Estimated: 36 mL/min — ABNORMAL LOW (ref >60)
Glucose, Bld: 112 mg/dL — ABNORMAL HIGH (ref 70–99)
Potassium: 3.6 mmol/L (ref 3.5–5.1)
Sodium: 135 mmol/L (ref 135–145)
Total Bilirubin: 0.7 mg/dL (ref 0.3–1.2)
Total Protein: 8 g/dL (ref 6.5–8.1)

## 2020-10-25 LAB — CBC
HCT: 46.2 % — ABNORMAL HIGH (ref 36.0–46.0)
Hemoglobin: 13.3 g/dL (ref 12.0–15.0)
MCH: 25.1 pg — ABNORMAL LOW (ref 26.0–34.0)
MCHC: 28.8 g/dL — ABNORMAL LOW (ref 30.0–36.0)
MCV: 87.2 fL (ref 80.0–100.0)
Platelets: 593 10*3/uL — ABNORMAL HIGH (ref 150–400)
RBC: 5.3 MIL/uL — ABNORMAL HIGH (ref 3.87–5.11)
RDW: 20.1 % — ABNORMAL HIGH (ref 11.5–15.5)
WBC: 12 10*3/uL — ABNORMAL HIGH (ref 4.0–10.5)
nRBC: 0 % (ref 0.0–0.2)

## 2020-10-25 LAB — I-STAT BETA HCG BLOOD, ED (MC, WL, AP ONLY): I-stat hCG, quantitative: <5 m[IU]/mL (ref <5)

## 2020-10-25 LAB — LIPASE, BLOOD: Lipase: 48 U/L (ref 11–51)

## 2020-10-25 MED ORDER — TRAMADOL HCL 50 MG PO TABS
50.0000 mg | ORAL_TABLET | Freq: Once | ORAL | Status: AC
Start: 2020-10-25 — End: 2020-10-25
  Administered 2020-10-25: 50 mg via ORAL
  Filled 2020-10-25: qty 1

## 2020-10-25 MED ORDER — ACETAMINOPHEN 500 MG PO TABS
1000.0000 mg | ORAL_TABLET | Freq: Once | ORAL | Status: AC
  Administered 2020-10-25: 1000 mg via ORAL
  Filled 2020-10-25: qty 2

## 2020-10-25 NOTE — ED Notes (Signed)
Patient ambulated out without assistance

## 2020-10-25 NOTE — ED Triage Notes (Signed)
Patient here for evaluation of abdominal pain, nausea, and vomiting. Patient states she was seen yesterday for back pain, states this pain started two months ago when she was discharged from the hospital. Patient alert, oriented, and in no apparent distress at this time.

## 2020-10-25 NOTE — Progress Notes (Signed)
CSW contacted [facility] where patient has been staying for the past month. CSW was able to locate patients walker and bedside commode. CSW contacted [employer] to see if they could pick up patients equipment and bring it to the emergency room so patient has it when she discharges. CSW also attempted to contact patients mother [name] [phone]) bu did not get an answer. Patients equipment will be delivered to the emergency room entrance.

## 2020-10-25 NOTE — ED Provider Notes (Addendum)
[facility] EMERGENCY DEPARTMENT Provider Note   CSN: [id number] Arrival date & time: 10/25/20  6606     History Chief Complaint  Patient presents with   Back Pain    Heather [name] is a 45 y.o. female.  Patient c/o low back pain. Symptoms gradual onset in past few days, dull, moderate, bilateral lumbar, non radiating, worse w certain movements or position changes. Denies specific injury, trauma or fall. States was in ED yesterday with same, but recently kicked out of hotel, so sat in chair in lobby and then decided to check back in. No hematuria, dysuria, urgency. No unilateral flank pain. No anterior pain, no abd or pelvic pain. No fever or chills. No nausea/vomiting. States has parent that lives in [place] but that she doesn't want pt to live there. Denies saddle area or leg numbness. No weakness. No problems w normal control of bowel or bladder movements.   The history is provided by the patient.  Abdominal Pain Associated symptoms: no chest pain, no chills, no diarrhea, no dysuria, no fever, no shortness of breath, no sore throat and no vomiting       Past Medical History:  Diagnosis Date   Allergy    Amenorrhea    Anemia    post partum    Anxiety    Anxiety    Arthritis    Asthma    Back pain    Constipation    COPD (chronic obstructive pulmonary disease) ([place])    Delivery with history of C-section    Depression    Depression    Diabetes mellitus without complication ([place])    patient denies but states she has hyperglycemia-diet controlled   Dysmenorrhea    Dysrhythmia    DR Johnsie Cancel     Ectopic pregnancy 2013   Edema, lower extremity    Eosinophilic esophagitis    Diagnosed at [facility] 06/16/2013, untreated   Gallbladder problem    GERD (gastroesophageal reflux disease)    HEARTBURN   TUMS   Hard to intubate 11/07/2015   High cholesterol    IBS (irritable bowel syndrome)    Leukocytosis 07/28/2008   Qualifier: Diagnosis of  By: Jonna Munro MD,  [place]     Morbid obesity [facility])    Neuromuscular disorder ([place])    RESTLESS LEG    Obesity    Schizoaffective disorder, bipolar type ([place])    Sepsis ([place]) 11/11/2014   Shortness of breath    WITH EXERTION    Sleep apnea    CPAP- in process of restarting     Patient Active Problem List   Diagnosis Date Noted   Major depression, chronic 09/13/2020   Insomnia    Anemia of chronic disease    Hyponatremia    AKI (acute kidney injury) ([place])    Chronic bilateral low back pain without sciatica    Debility 08/24/2020   SIRS (systemic inflammatory response syndrome) ([place]) 08/18/2020   Pressure injury of skin 08/11/2020   Rhabdomyolysis 08/10/2020   Acute renal failure ([place]) 08/10/2020   PTSD (post-traumatic stress disorder) 05/15/2020   Major depressive disorder, recurrent episode, moderate ([place]) 05/05/2020   Generalized anxiety disorder 05/05/2020   Blood in stool    Gastritis and gastroduodenitis    Benign neoplasm of sigmoid colon    Difficult intubation 08/10/2018   Class 3 severe obesity with serious comorbidity and body mass index (BMI) greater than or equal to 70 in adult ([place]) 06/16/2018   Nexplanon insertion 03/27/2017  Postpartum hypertension 03/05/2017   Status post primary low transverse cesarean section 02/03/2017   H/O pre-eclampsia 01/27/2017   Gestational htn w/o significant proteinuria, third trimester 01/20/2017   Mild persistent asthma without complication 92/42/6834   Perennial allergic rhinitis 11/05/2016   Mild persistent asthma with acute exacerbation 11/05/2016   Excess weight gain in pregnancy, second trimester 10/30/2016   Hard to intubate 11/07/2015   Hypoglycemia 11/07/2015   OSA (obstructive sleep apnea) 11/07/2015   DM type 2 (diabetes mellitus, type 2) ([place]) 12/07/2014   Panniculitis 12/06/2014   Cellulitis, abdominal wall 11/11/2014   Abdominal pain 07/14/2014   Loose stools 07/14/2014   Melena 19/62/2297   Eosinophilic esophagitis  98/92/1194   Change in bowel habits 04/28/2013   Esophageal dysphagia 04/28/2013   Insomnia due to mental disorder(327.02) 08/08/2011   RLS (restless legs syndrome) 08/08/2011   PALPITATIONS, OCCASIONAL 11/01/2009   DISORDER, TOBACCO USE 08/25/2008   Leucocytosis 07/28/2008   ALLERGIC RHINITIS, SEASONAL 17/40/8144   DYSMETABOLIC SYNDROME 81/85/6314   Morbid obesity ([place]) 05/13/2006   EXTERNAL HEMORRHOIDS 05/13/2006   HYPERLIPIDEMIA 05/12/2006   Essential hypertension 05/12/2006   ASTHMA 05/12/2006   GERD (gastroesophageal reflux disease) 05/12/2006   OSTEOARTHRITIS 05/12/2006    Past Surgical History:  Procedure Laterality Date   BIOPSY  08/13/2018   Procedure: BIOPSY;  Surgeon: Yetta Flock, MD;  Location: [facility];  Service: Gastroenterology;;   CESAREAN SECTION MULTI-GESTATIONAL N/A 02/03/2017   Procedure: CESAREAN SECTION MULTI-GESTATIONAL;  Surgeon: Jonnie Kind, MD;  Location: [facility];  Service: Obstetrics;  Laterality: N/A;   CHOLECYSTECTOMY     COLONOSCOPY WITH PROPOFOL N/A 08/13/2018   Procedure: COLONOSCOPY WITH PROPOFOL;  Surgeon: Yetta Flock, MD;  Location: [facility];  Service: Gastroenterology;  Laterality: N/A;   DENTAL SURGERY     ESOPHAGOGASTRODUODENOSCOPY  May 2007   Dr. Gala Romney: Normal esophagus, stomach, D1, D2   ESOPHAGOGASTRODUODENOSCOPY  06/16/2013   Dr. Carlton Adam, eosinophilic esophagitis, reactive gastropathy, no esophageal dilation   ESOPHAGOGASTRODUODENOSCOPY (EGD) WITH PROPOFOL N/A 08/13/2018   Procedure: ESOPHAGOGASTRODUODENOSCOPY (EGD) WITH PROPOFOL;  Surgeon: Yetta Flock, MD;  Location: [facility];  Service: Gastroenterology;  Laterality: N/A;   POLYPECTOMY  08/13/2018   Procedure: POLYPECTOMY;  Surgeon: Yetta Flock, MD;  Location: [facility];  Service: Gastroenterology;;   TONSILLECTOMY     TOOTH EXTRACTION  10/28/2011   Procedure: DENTAL RESTORATION/EXTRACTIONS;  Surgeon: Gae Bon, DDS;   Location: [facility] OR;  Service: Oral Surgery;;   UPPER GASTROINTESTINAL ENDOSCOPY       OB History     Gravida  2   Para  1   Term  1   Preterm      AB  1   Living  2      SAB  0   IAB      Ectopic  1   Multiple  1   Live Births  2           Family History  Problem Relation Age of Onset   Depression Mother    Anxiety disorder Mother    High blood pressure Mother    Bipolar disorder Mother    Eating disorder Mother    Obesity Mother    Hypertension Sister    Allergic rhinitis Sister    Colon polyps Maternal Grandmother        15s   Diabetes Maternal Grandmother    Anxiety disorder Maternal Grandmother    COPD Maternal Grandmother    Crohn's  disease Maternal Aunt    Cancer Maternal Grandfather        prostate   HIV/AIDS Father    Eating disorder Father    Obesity Father    Liver disease Neg Hx    Angioedema Neg Hx    Eczema Neg Hx    Immunodeficiency Neg Hx    Asthma Neg Hx    Urticaria Neg Hx    Colon cancer Neg Hx    Esophageal cancer Neg Hx    Rectal cancer Neg Hx    Stomach cancer Neg Hx     Social History   Tobacco Use   Smoking status: Former    Packs/day: 0.50    Years: 8.00    Pack years: 4.00    Types: Cigarettes    Quit date: 04/25/2011    Years since quitting: 9.5   Smokeless tobacco: Never  Vaping Use   Vaping Use: Never used  Substance Use Topics   Alcohol use: No   Drug use: No    Home Medications Prior to Admission medications   Medication Sig Start Date End Date Taking? Authorizing Provider  acetaminophen (TYLENOL) 325 MG tablet Take 650 mg by mouth every 6 (six) hours as needed for mild pain, fever or headache.    [provider]  albuterol (VENTOLIN HFA) 108 (90 Base) MCG/ACT inhaler INHALE 2 PUFFS INTO THE LUNGS EVERY 4 HOURS AS NEEDED FOR WHEEZING OR SHORTNESS OF BREATH Patient taking differently: Inhale 2 puffs into the lungs every 4 (four) hours as needed for wheezing or shortness of breath. 12/02/19    Valentina Shaggy, MD  ascorbic acid (VITAMIN C) 500 MG tablet Take 1 tablet (500 mg total) by mouth daily. 09/14/20   Love, Ivan Anchors, PA-C  buPROPion (WELLBUTRIN SR) 100 MG 12 hr tablet Take 2 tablets (200 mg total) by mouth 2 (two) times daily. 10/02/20 10/02/21  Patrecia Pour, NP  busPIRone (BUSPAR) 30 MG tablet Take 0.5 tablets (15 mg total) by mouth daily. 10/02/20 11/01/20  Patrecia Pour, NP  Cyanocobalamin (VITAMIN B-12) 5000 MCG TBDP Take 5,000 mcg by mouth daily.    [provider]  diclofenac Sodium (VOLTAREN) 1 % GEL Apply 2 g topically 4 (four) times daily. 10/24/20   Caccavale, Sophia, PA-C  DULoxetine (CYMBALTA) 20 MG capsule Take 1 capsule (20 mg total) by mouth daily. 10/02/20 11/01/20  Patrecia Pour, NP  fluticasone (FLONASE) 50 MCG/ACT nasal spray Place 1 spray into both nostrils in the morning and at bedtime.    [provider]  fluticasone (FLOVENT HFA) 110 MCG/ACT inhaler Inhale 2 puffs into the lungs 2 (two) times daily. 06/02/19   Valentina Shaggy, MD  folic acid (FOLVITE) 1 MG tablet Take 1 tablet (1 mg total) by mouth daily. 08/24/20 11/22/20  Antonieta Pert, MD  gabapentin (NEURONTIN) 100 MG capsule Take 1 capsule (100 mg total) by mouth 3 (three) times daily. 10/02/20 10/02/21  Patrecia Pour, NP  methocarbamol (ROBAXIN) 500 MG tablet Take 1 tablet (500 mg total) by mouth 2 (two) times daily as needed for muscle spasms. 10/24/20   Caccavale, Sophia, PA-C  montelukast (SINGULAIR) 10 MG tablet Take 1 tablet (10 mg total) by mouth at bedtime. 08/18/20   Dwyane Dee, MD  Multiple Vitamin (MULTIVITAMIN WITH MINERALS) TABS tablet Take 1 tablet by mouth daily. 09/14/20   Love, Ivan Anchors, PA-C  rOPINIRole (REQUIP) 3 MG tablet TAKE 1 TABLET(3 MG) BY MOUTH AT BEDTIME 08/18/20   Lord,  Asa Saunas, NP  senna-docusate (SENOKOT-S) 8.6-50 MG tablet Take 2 tablets by mouth daily. 09/14/20   Love, Ivan Anchors, PA-C  silver sulfADIAZINE (SILVADENE) 1 % cream Apply topically 2 (two)  times daily. 09/13/20   Love, Ivan Anchors, PA-C  tamsulosin (FLOMAX) 0.4 MG CAPS capsule Take 1 capsule (0.4 mg total) by mouth daily after supper. 09/13/20   Love, Ivan Anchors, PA-C  traMADol (ULTRAM) 50 MG tablet Take 1 tablet (50 mg total) by mouth every 6 (six) hours as needed for severe pain. 09/13/20   Love, Ivan Anchors, PA-C  traZODone (DESYREL) 50 MG tablet Take 0.5 tablets (25 mg total) by mouth at bedtime. 10/02/20   Patrecia Pour, NP  Zinc Sulfate 220 (50 Zn) MG TABS Take 1 tablet (220 mg total) by mouth daily. 09/14/20   Love, Ivan Anchors, PA-C    Allergies    Bee venom, Penicillins, Penicillin g, Adhesive [tape], Latex, and Vancomycin  Review of Systems   Review of Systems  Constitutional:  Negative for chills and fever.  HENT:  Negative for sore throat.   Eyes:  Negative for redness.  Respiratory:  Negative for shortness of breath.   Cardiovascular:  Negative for chest pain.  Gastrointestinal:  Negative for abdominal pain, diarrhea and vomiting.  Genitourinary:  Negative for dysuria, flank pain, frequency and urgency.  Musculoskeletal:  Positive for back pain. Negative for neck pain.  Skin:  Negative for rash.  Neurological:  Negative for weakness, numbness and headaches.  Hematological:  Does not bruise/bleed easily.  Psychiatric/Behavioral:  Negative for confusion.    Physical Exam Updated Vital Signs BP (!) 142/87   Pulse 97   Temp 98.4 F (36.9 C) (Oral)   Resp 15   SpO2 98%   Physical Exam Vitals and nursing note reviewed.  Constitutional:      Appearance: Normal appearance. She is well-developed.  HENT:     Head: Atraumatic.     Nose: Nose normal.     Mouth/Throat:     Mouth: Mucous membranes are moist.  Eyes:     General: No scleral icterus.    Conjunctiva/sclera: Conjunctivae normal.  Neck:     Trachea: No tracheal deviation.  Cardiovascular:     Rate and Rhythm: Normal rate and regular rhythm.     Pulses: Normal pulses.     Heart sounds: Normal heart sounds. No  murmur heard.   No friction rub. No gallop.  Pulmonary:     Effort: Pulmonary effort is normal. No respiratory distress.     Breath sounds: Normal breath sounds.  Abdominal:     General: Bowel sounds are normal. There is no distension.     Palpations: Abdomen is soft.     Tenderness: There is no abdominal tenderness. There is no guarding.     Comments: Morbidly obese.   Genitourinary:    Comments: No cva tenderness.  Musculoskeletal:        General: No swelling.     Cervical back: Normal range of motion and neck supple. No rigidity. No muscular tenderness.     Comments: Lumbar muscular tenderness. CTLS spine, non tender, aligned, no step off.   Skin:    General: Skin is warm and dry.     Findings: No rash.  Neurological:     Mental Status: She is alert.     Comments: Alert, speech normal. Motor intact bil legs, stre 5/5. Sens grossly intact.   Psychiatric:        Mood and  Affect: Mood normal.    ED Results / Procedures / Treatments   Labs (all labs ordered are listed, but only abnormal results are displayed) Results for orders placed or performed during the hospital encounter of 10/25/20  Lipase, blood  Result Value Ref Range   Lipase 48 11 - 51 U/L  Comprehensive metabolic panel  Result Value Ref Range   Sodium 135 135 - 145 mmol/L   Potassium 3.6 3.5 - 5.1 mmol/L   Chloride 101 98 - 111 mmol/L   CO2 20 (L) 22 - 32 mmol/L   Glucose, Bld 112 (H) 70 - 99 mg/dL   BUN 11 6 - 20 mg/dL   Creatinine, Ser 1.79 (H) 0.44 - 1.00 mg/dL   Calcium 9.9 8.9 - 10.3 mg/dL   Total Protein 8.0 6.5 - 8.1 g/dL   Albumin 3.5 3.5 - 5.0 g/dL   AST 34 15 - 41 U/L   ALT 27 0 - 44 U/L   Alkaline Phosphatase 71 38 - 126 U/L   Total Bilirubin 0.7 0.3 - 1.2 mg/dL   GFR, Estimated 36 (L) >60 mL/min   Anion gap 14 5 - 15  CBC  Result Value Ref Range   WBC 12.0 (H) 4.0 - 10.5 K/uL   RBC 5.30 (H) 3.87 - 5.11 MIL/uL   Hemoglobin 13.3 12.0 - 15.0 g/dL   HCT 46.2 (H) 36.0 - 46.0 %   MCV 87.2  80.0 - 100.0 fL   MCH 25.1 (L) 26.0 - 34.0 pg   MCHC 28.8 (L) 30.0 - 36.0 g/dL   RDW 20.1 (H) 11.5 - 15.5 %   Platelets 593 (H) 150 - 400 K/uL   nRBC 0.0 0.0 - 0.2 %  I-Stat beta hCG blood, ED  Result Value Ref Range   I-stat hCG, quantitative <5.0 <5 mIU/mL   Comment 3           CT ABDOMEN PELVIS WO CONTRAST  Result Date: 10/24/2020 CLINICAL DATA:  Abdominal pain, nausea/vomiting EXAM: CT ABDOMEN AND PELVIS WITHOUT CONTRAST TECHNIQUE: Multidetector CT imaging of the abdomen and pelvis was performed following the standard protocol without IV contrast. COMPARISON:  08/18/2020 FINDINGS: Lower chest: Lung bases are clear. Hepatobiliary: Severe hepatic steatosis. Status post cholecystectomy. No intrahepatic or extrahepatic ductal dilatation. Pancreas: Within normal limits. Spleen: Within normal limits. Adrenals/Urinary Tract: Adrenal glands are within normal limits. 8.3 cm right upper pole renal cyst, measuring simple fluid density, likely benign. Three nonobstructing right renal calculi, measuring up to 1.9 cm in the interpolar region (coronal image 34). Left lower pole renal staghorn calculi measuring up to 5.3 cm (coronal image 85). No hydronephrosis. Bladder is within normal limits. Stomach/Bowel: Stomach is notable for a tiny hiatal hernia. No evidence of bowel obstruction. Normal appendix (series 3/image 60). No colonic wall thickening or inflammatory changes. Vascular/Lymphatic: No evidence of abdominal aortic aneurysm. No suspicious abdominopelvic lymphadenopathy. Reproductive: Uterus is within normal limits. Bilateral ovaries are within normal limits. Other: No abdominopelvic ascites. Musculoskeletal: Grade 1 spondylolisthesis at L5-S1. Degenerative changes of the visualized thoracolumbar spine. IMPRESSION: Bilateral nonobstructing renal calculi, including a 5.3 cm left renal staghorn calculus. No hydronephrosis. Severe hepatic steatosis. Additional stable ancillary findings as above.  Electronically Signed   By: Julian Hy M.D.   On: 10/24/2020 19:43   DG Chest 2 View  Result Date: 10/24/2020 CLINICAL DATA:  Chest tightness for 3 days. EXAM: CHEST - 2 VIEW COMPARISON:  Single-view of the chest 08/18/2020. PA and lateral chest 06/22/2018. FINDINGS: Lungs clear.  Heart size normal. No pneumothorax or pleural fluid. No acute or focal bony abnormality. IMPRESSION: Negative chest. Electronically Signed   By: Inge Rise M.D.   On: 10/24/2020 14:51    EKG None  Radiology CT ABDOMEN PELVIS WO CONTRAST  Result Date: 10/24/2020 CLINICAL DATA:  Abdominal pain, nausea/vomiting EXAM: CT ABDOMEN AND PELVIS WITHOUT CONTRAST TECHNIQUE: Multidetector CT imaging of the abdomen and pelvis was performed following the standard protocol without IV contrast. COMPARISON:  08/18/2020 FINDINGS: Lower chest: Lung bases are clear. Hepatobiliary: Severe hepatic steatosis. Status post cholecystectomy. No intrahepatic or extrahepatic ductal dilatation. Pancreas: Within normal limits. Spleen: Within normal limits. Adrenals/Urinary Tract: Adrenal glands are within normal limits. 8.3 cm right upper pole renal cyst, measuring simple fluid density, likely benign. Three nonobstructing right renal calculi, measuring up to 1.9 cm in the interpolar region (coronal image 34). Left lower pole renal staghorn calculi measuring up to 5.3 cm (coronal image 85). No hydronephrosis. Bladder is within normal limits. Stomach/Bowel: Stomach is notable for a tiny hiatal hernia. No evidence of bowel obstruction. Normal appendix (series 3/image 60). No colonic wall thickening or inflammatory changes. Vascular/Lymphatic: No evidence of abdominal aortic aneurysm. No suspicious abdominopelvic lymphadenopathy. Reproductive: Uterus is within normal limits. Bilateral ovaries are within normal limits. Other: No abdominopelvic ascites. Musculoskeletal: Grade 1 spondylolisthesis at L5-S1. Degenerative changes of the visualized  thoracolumbar spine. IMPRESSION: Bilateral nonobstructing renal calculi, including a 5.3 cm left renal staghorn calculus. No hydronephrosis. Severe hepatic steatosis. Additional stable ancillary findings as above. Electronically Signed   By: Julian Hy M.D.   On: 10/24/2020 19:43   DG Chest 2 View  Result Date: 10/24/2020 CLINICAL DATA:  Chest tightness for 3 days. EXAM: CHEST - 2 VIEW COMPARISON:  Single-view of the chest 08/18/2020. PA and lateral chest 06/22/2018. FINDINGS: Lungs clear. Heart size normal. No pneumothorax or pleural fluid. No acute or focal bony abnormality. IMPRESSION: Negative chest. Electronically Signed   By: Inge Rise M.D.   On: 10/24/2020 14:51    Procedures Procedures   Medications Ordered in ED Medications  acetaminophen (TYLENOL) tablet 1,000 mg (has no administration in time range)  traMADol (ULTRAM) tablet 50 mg (has no administration in time range)    ED Course  I have reviewed the triage vital signs and the nursing notes.  Pertinent labs & imaging results that were available during my care of the patient were reviewed by me and considered in my medical decision making (see chart for details).    MDM Rules/Calculators/A&P                           Labs sent from triage.  Reviewed nursing notes and prior charts for additional history.   Po fluids.   Labs reviewed/interpreted by me - mildly increase in cr from prior, 1.61 to 1.79.   XR reviewed/interpreted by me - no pna.  CT reviewed/interpreted by me - bil kidney stones, no obstructing stones.  Additional po fluids. Ultram po. Acetaminophen po.   Discussed/consulted with TOC/SW - they recommend providing social services and shelter guide - provided.  They have spoken with patient/provided resources.   Pt is comfortably appearing, nad, and appears stable for d/c.   Rec pcp f/u. Avoid nsaids, push fluids/rehydrate.   Return precautions provided.    Final Clinical Impression(s)  / ED Diagnoses Final diagnoses:  None    Rx / DC Orders ED Discharge Orders     None  Lajean Saver, MD 10/25/20 1247

## 2020-10-25 NOTE — ED Notes (Signed)
Offered crackers and diet sprite.  Patient homeless and requesting walker to go home with.

## 2020-10-25 NOTE — Discharge Instructions (Addendum)
It was our pleasure to provide your ER care today - we hope that you feel better.  See attached resource guide as relates social service support, shelters, and other community resources.   From the lab tests, your kidney function, creatinine is mildly increased from prior (Cr 1.79) - make sure to drink plenty of fluids and stay well hydrated. Also avoid taking any anti-inflammatory type of pain medications such as ibuprofen/motrin or naprosyn/aleve.  Follow up with primary care doctor in one week.   Avoid bending at waist or heavy lifting > 20 lbs for the next week. Try heat therapy. Take acetaminophen as need for pain. You may also take robaxin as need for muscle pain/spasm - no driving when taking.   Follow up with primary care doctor in one week.  Return to ER if worse, new symptoms, fevers, severe/intractable pain, numbness/weakness, problems with bowel/bladder function, or other concern.

## 2020-10-26 ENCOUNTER — Encounter (HOSPITAL_COMMUNITY): Payer: Self-pay | Admitting: *Deleted

## 2020-10-26 ENCOUNTER — Emergency Department (HOSPITAL_COMMUNITY)
Admission: EM | Admit: 2020-10-26 | Discharge: 2020-10-27 | Disposition: A | Discharge status: Home / Self Care | Payer: Medicaid Other | Attending: Emergency Medicine | Admitting: Emergency Medicine

## 2020-10-26 ENCOUNTER — Other Ambulatory Visit: Payer: Self-pay

## 2020-10-26 DIAGNOSIS — Z7951 Long term (current) use of inhaled steroids: Secondary | ICD-10-CM | POA: Diagnosis not present

## 2020-10-26 DIAGNOSIS — E785 Hyperlipidemia, unspecified: Secondary | ICD-10-CM | POA: Insufficient documentation

## 2020-10-26 DIAGNOSIS — Z85038 Personal history of other malignant neoplasm of large intestine: Secondary | ICD-10-CM | POA: Diagnosis not present

## 2020-10-26 DIAGNOSIS — R350 Frequency of micturition: Secondary | ICD-10-CM | POA: Diagnosis not present

## 2020-10-26 DIAGNOSIS — F4312 Post-traumatic stress disorder, chronic: Secondary | ICD-10-CM | POA: Insufficient documentation

## 2020-10-26 DIAGNOSIS — Z79899 Other long term (current) drug therapy: Secondary | ICD-10-CM | POA: Diagnosis not present

## 2020-10-26 DIAGNOSIS — Z5900 Homelessness unspecified: Secondary | ICD-10-CM | POA: Insufficient documentation

## 2020-10-26 DIAGNOSIS — R45851 Suicidal ideations: Secondary | ICD-10-CM | POA: Diagnosis not present

## 2020-10-26 DIAGNOSIS — Y900 Blood alcohol level of less than 20 mg/100 ml: Secondary | ICD-10-CM | POA: Diagnosis not present

## 2020-10-26 DIAGNOSIS — Z87891 Personal history of nicotine dependence: Secondary | ICD-10-CM | POA: Diagnosis not present

## 2020-10-26 DIAGNOSIS — Z91040 Latex allergy status: Secondary | ICD-10-CM | POA: Insufficient documentation

## 2020-10-26 DIAGNOSIS — J45909 Unspecified asthma, uncomplicated: Secondary | ICD-10-CM | POA: Insufficient documentation

## 2020-10-26 DIAGNOSIS — E1169 Type 2 diabetes mellitus with other specified complication: Secondary | ICD-10-CM | POA: Insufficient documentation

## 2020-10-26 DIAGNOSIS — J449 Chronic obstructive pulmonary disease, unspecified: Secondary | ICD-10-CM | POA: Diagnosis not present

## 2020-10-26 DIAGNOSIS — Z20822 Contact with and (suspected) exposure to covid-19: Secondary | ICD-10-CM | POA: Insufficient documentation

## 2020-10-26 DIAGNOSIS — I10 Essential (primary) hypertension: Secondary | ICD-10-CM | POA: Insufficient documentation

## 2020-10-26 DIAGNOSIS — F332 Major depressive disorder, recurrent severe without psychotic features: Secondary | ICD-10-CM | POA: Diagnosis present

## 2020-10-26 DIAGNOSIS — F4323 Adjustment disorder with mixed anxiety and depressed mood: Secondary | ICD-10-CM | POA: Diagnosis present

## 2020-10-26 DIAGNOSIS — E11649 Type 2 diabetes mellitus with hypoglycemia without coma: Secondary | ICD-10-CM | POA: Diagnosis not present

## 2020-10-26 LAB — CBC WITH DIFFERENTIAL/PLATELET
Abs Immature Granulocytes: 0.04 10*3/uL (ref 0.00–0.07)
Basophils Absolute: 0 10*3/uL (ref 0.0–0.1)
Basophils Relative: 0 %
Eosinophils Absolute: 0.4 10*3/uL (ref 0.0–0.5)
Eosinophils Relative: 4 %
HCT: 40.4 % (ref 36.0–46.0)
Hemoglobin: 11.7 g/dL — ABNORMAL LOW (ref 12.0–15.0)
Immature Granulocytes: 0 %
Lymphocytes Relative: 19 %
Lymphs Abs: 2 10*3/uL (ref 0.7–4.0)
MCH: 25.3 pg — ABNORMAL LOW (ref 26.0–34.0)
MCHC: 29 g/dL — ABNORMAL LOW (ref 30.0–36.0)
MCV: 87.3 fL (ref 80.0–100.0)
Monocytes Absolute: 0.6 10*3/uL (ref 0.1–1.0)
Monocytes Relative: 6 %
Neutro Abs: 7.6 10*3/uL (ref 1.7–7.7)
Neutrophils Relative %: 71 %
Platelets: 493 10*3/uL — ABNORMAL HIGH (ref 150–400)
RBC: 4.63 MIL/uL (ref 3.87–5.11)
RDW: 19.8 % — ABNORMAL HIGH (ref 11.5–15.5)
WBC: 10.7 10*3/uL — ABNORMAL HIGH (ref 4.0–10.5)
nRBC: 0 % (ref 0.0–0.2)

## 2020-10-26 LAB — COMPREHENSIVE METABOLIC PANEL
ALT: 25 U/L (ref 0–44)
AST: 28 U/L (ref 15–41)
Albumin: 3.4 g/dL — ABNORMAL LOW (ref 3.5–5.0)
Alkaline Phosphatase: 61 U/L (ref 38–126)
Anion gap: 13 (ref 5–15)
BUN: 14 mg/dL (ref 6–20)
CO2: 23 mmol/L (ref 22–32)
Calcium: 9.6 mg/dL (ref 8.9–10.3)
Chloride: 102 mmol/L (ref 98–111)
Creatinine, Ser: 1.77 mg/dL — ABNORMAL HIGH (ref 0.44–1.00)
GFR, Estimated: 36 mL/min — ABNORMAL LOW (ref >60)
Glucose, Bld: 85 mg/dL (ref 70–99)
Potassium: 3.5 mmol/L (ref 3.5–5.1)
Sodium: 138 mmol/L (ref 135–145)
Total Bilirubin: 0.6 mg/dL (ref 0.3–1.2)
Total Protein: 7.5 g/dL (ref 6.5–8.1)

## 2020-10-26 LAB — RAPID URINE DRUG SCREEN, HOSP PERFORMED
Amphetamines: NOT DETECTED
Barbiturates: NOT DETECTED
Benzodiazepines: NOT DETECTED
Cocaine: POSITIVE — AB
Opiates: NOT DETECTED
Tetrahydrocannabinol: NOT DETECTED

## 2020-10-26 LAB — URINE CULTURE: Culture: >=100000 — AB

## 2020-10-26 LAB — URINALYSIS, ROUTINE W REFLEX MICROSCOPIC
Bilirubin Urine: NEGATIVE
Glucose, UA: NEGATIVE mg/dL
Ketones, ur: 5 mg/dL — AB
Nitrite: NEGATIVE
Protein, ur: 30 mg/dL — AB
Specific Gravity, Urine: 1.012 (ref 1.005–1.030)
WBC, UA: >50 WBC/hpf — ABNORMAL HIGH (ref 0–5)
pH: 6 (ref 5.0–8.0)

## 2020-10-26 LAB — RESP PANEL BY RT-PCR (FLU A&B, COVID) ARPGX2
Influenza A by PCR: NEGATIVE
Influenza B by PCR: NEGATIVE
SARS Coronavirus 2 by RT PCR: NEGATIVE

## 2020-10-26 LAB — PREGNANCY, URINE: Preg Test, Ur: NEGATIVE

## 2020-10-26 LAB — ETHANOL: Alcohol, Ethyl (B): <10 mg/dL (ref <10)

## 2020-10-26 MED ORDER — IBUPROFEN 200 MG PO TABS
600.0000 mg | ORAL_TABLET | Freq: Once | ORAL | Status: AC
  Administered 2020-10-26: 600 mg via ORAL
  Filled 2020-10-26: qty 3

## 2020-10-26 MED ORDER — NITROFURANTOIN MONOHYD MACRO 100 MG PO CAPS
100.0000 mg | ORAL_CAPSULE | Freq: Two times a day (BID) | ORAL | Status: DC
  Administered 2020-10-26 – 2020-10-27 (×2): 100 mg via ORAL
  Filled 2020-10-26 (×2): qty 1

## 2020-10-26 NOTE — ED Notes (Signed)
Pt belongings (2 bags) labeled and placed in patient belongings locker

## 2020-10-26 NOTE — ED Notes (Signed)
Pt encouraged to urinate.  Given fluids.

## 2020-10-26 NOTE — ED Provider Notes (Signed)
[facility] DEPT Provider Note   CSN: [id number] Arrival date & time: 10/26/20  1210     History Chief Complaint  Patient presents with   Urinary Frequency    Heather [name] is a 45 y.o. female.  Patient presents to the ER with a chief complaint of "needing rehab."  Patient states that she has a history of chronic back pain obesity and is now recently homeless.  She is been to the ER twice within the last 3 days now today being her third visit.  She states that she just does not want to live anymore if she has to continue living on the streets.  She has no concrete plan of self-harm but expresses suicidal thoughts.  She states that she is frustrated that she is not able to get the rehab that she needs and frustrated at her social situation.  Otherwise denies fevers or cough or vomiting or diarrhea.      Past Medical History:  Diagnosis Date   Allergy    Amenorrhea    Anemia    post partum    Anxiety    Anxiety    Arthritis    Asthma    Back pain    Constipation    COPD (chronic obstructive pulmonary disease) ([place])    Delivery with history of C-section    Depression    Depression    Diabetes mellitus without complication ([place])    patient denies but states she has hyperglycemia-diet controlled   Dysmenorrhea    Dysrhythmia    DR Johnsie Cancel     Ectopic pregnancy 2013   Edema, lower extremity    Eosinophilic esophagitis    Diagnosed at [facility] 06/16/2013, untreated   Gallbladder problem    GERD (gastroesophageal reflux disease)    HEARTBURN   TUMS   Hard to intubate 11/07/2015   High cholesterol    IBS (irritable bowel syndrome)    Leukocytosis 07/28/2008   Qualifier: Diagnosis of  By: Jonna Munro MD, Cornelius     Morbid obesity [facility])    Neuromuscular disorder ([place])    RESTLESS LEG    Obesity    Schizoaffective disorder, bipolar type ([place])    Sepsis ([place]) 11/11/2014   Shortness of breath    WITH EXERTION    Sleep apnea    CPAP- in  process of restarting     Patient Active Problem List   Diagnosis Date Noted   Major depression, chronic 09/13/2020   Insomnia    Anemia of chronic disease    Hyponatremia    AKI (acute kidney injury) ([place])    Chronic bilateral low back pain without sciatica    Debility 08/24/2020   SIRS (systemic inflammatory response syndrome) ([place]) 08/18/2020   Pressure injury of skin 08/11/2020   Rhabdomyolysis 08/10/2020   Acute renal failure ([place]) 08/10/2020   PTSD (post-traumatic stress disorder) 05/15/2020   Major depressive disorder, recurrent episode, moderate ([place]) 05/05/2020   Generalized anxiety disorder 05/05/2020   Blood in stool    Gastritis and gastroduodenitis    Benign neoplasm of sigmoid colon    Difficult intubation 08/10/2018   Class 3 severe obesity with serious comorbidity and body mass index (BMI) greater than or equal to 70 in adult [facility]) 06/16/2018   Nexplanon insertion 03/27/2017   Postpartum hypertension 03/05/2017   Status post primary low transverse cesarean section 02/03/2017   H/O pre-eclampsia 01/27/2017   Gestational htn w/o significant proteinuria, third trimester 01/20/2017   Mild persistent asthma  without complication 99/37/1696   Perennial allergic rhinitis 11/05/2016   Mild persistent asthma with acute exacerbation 11/05/2016   Excess weight gain in pregnancy, second trimester 10/30/2016   Hard to intubate 11/07/2015   Hypoglycemia 11/07/2015   OSA (obstructive sleep apnea) 11/07/2015   DM type 2 (diabetes mellitus, type 2) ([place]) 12/07/2014   Panniculitis 12/06/2014   Cellulitis, abdominal wall 11/11/2014   Abdominal pain 07/14/2014   Loose stools 07/14/2014   Melena 78/93/8101   Eosinophilic esophagitis 75/01/2584   Change in bowel habits 04/28/2013   Esophageal dysphagia 04/28/2013   Insomnia due to mental disorder(327.02) 08/08/2011   RLS (restless legs syndrome) 08/08/2011   PALPITATIONS, OCCASIONAL 11/01/2009   DISORDER, TOBACCO USE  08/25/2008   Leucocytosis 07/28/2008   ALLERGIC RHINITIS, SEASONAL 27/78/2423   DYSMETABOLIC SYNDROME 53/61/4431   Morbid obesity ([place]) 05/13/2006   EXTERNAL HEMORRHOIDS 05/13/2006   HYPERLIPIDEMIA 05/12/2006   Essential hypertension 05/12/2006   ASTHMA 05/12/2006   GERD (gastroesophageal reflux disease) 05/12/2006   OSTEOARTHRITIS 05/12/2006    Past Surgical History:  Procedure Laterality Date   BIOPSY  08/13/2018   Procedure: BIOPSY;  Surgeon: Yetta Flock, MD;  Location: [facility];  Service: Gastroenterology;;   CESAREAN SECTION MULTI-GESTATIONAL N/A 02/03/2017   Procedure: CESAREAN SECTION MULTI-GESTATIONAL;  Surgeon: Jonnie Kind, MD;  Location: [facility];  Service: Obstetrics;  Laterality: N/A;   CHOLECYSTECTOMY     COLONOSCOPY WITH PROPOFOL N/A 08/13/2018   Procedure: COLONOSCOPY WITH PROPOFOL;  Surgeon: Yetta Flock, MD;  Location: [facility];  Service: Gastroenterology;  Laterality: N/A;   DENTAL SURGERY     ESOPHAGOGASTRODUODENOSCOPY  May 2007   Dr. Gala Romney: Normal esophagus, stomach, D1, D2   ESOPHAGOGASTRODUODENOSCOPY  06/16/2013   Dr. Carlton Adam, eosinophilic esophagitis, reactive gastropathy, no esophageal dilation   ESOPHAGOGASTRODUODENOSCOPY (EGD) WITH PROPOFOL N/A 08/13/2018   Procedure: ESOPHAGOGASTRODUODENOSCOPY (EGD) WITH PROPOFOL;  Surgeon: Yetta Flock, MD;  Location: [facility];  Service: Gastroenterology;  Laterality: N/A;   POLYPECTOMY  08/13/2018   Procedure: POLYPECTOMY;  Surgeon: Yetta Flock, MD;  Location: [facility];  Service: Gastroenterology;;   TONSILLECTOMY     TOOTH EXTRACTION  10/28/2011   Procedure: DENTAL RESTORATION/EXTRACTIONS;  Surgeon: Gae Bon, DDS;  Location: [facility];  Service: Oral Surgery;;   UPPER GASTROINTESTINAL ENDOSCOPY       OB History     Gravida  2   Para  1   Term  1   Preterm      AB  1   Living  2      SAB  0   IAB      Ectopic  1   Multiple  1   Live  Births  2           Family History  Problem Relation Age of Onset   Depression Mother    Anxiety disorder Mother    High blood pressure Mother    Bipolar disorder Mother    Eating disorder Mother    Obesity Mother    Hypertension Sister    Allergic rhinitis Sister    Colon polyps Maternal Grandmother        63s   Diabetes Maternal Grandmother    Anxiety disorder Maternal Grandmother    COPD Maternal Grandmother    Crohn's disease Maternal Aunt    Cancer Maternal Grandfather        prostate   HIV/AIDS Father    Eating disorder Father    Obesity Father  Liver disease Neg Hx    Angioedema Neg Hx    Eczema Neg Hx    Immunodeficiency Neg Hx    Asthma Neg Hx    Urticaria Neg Hx    Colon cancer Neg Hx    Esophageal cancer Neg Hx    Rectal cancer Neg Hx    Stomach cancer Neg Hx     Social History   Tobacco Use   Smoking status: Former    Packs/day: 0.50    Years: 8.00    Pack years: 4.00    Types: Cigarettes    Quit date: 04/25/2011    Years since quitting: 9.5   Smokeless tobacco: Never  Vaping Use   Vaping Use: Never used  Substance Use Topics   Alcohol use: No   Drug use: No    Home Medications Prior to Admission medications   Medication Sig Start Date End Date Taking? Authorizing Provider  acetaminophen (TYLENOL) 325 MG tablet Take 650 mg by mouth every 6 (six) hours as needed for mild pain, fever or headache.    [provider]  albuterol (VENTOLIN HFA) 108 (90 Base) MCG/ACT inhaler INHALE 2 PUFFS INTO THE LUNGS EVERY 4 HOURS AS NEEDED FOR WHEEZING OR SHORTNESS OF BREATH Patient taking differently: Inhale 2 puffs into the lungs every 4 (four) hours as needed for wheezing or shortness of breath. 12/02/19   Valentina Shaggy, MD  ascorbic acid (VITAMIN C) 500 MG tablet Take 1 tablet (500 mg total) by mouth daily. 09/14/20   Love, Ivan Anchors, PA-C  buPROPion (WELLBUTRIN SR) 100 MG 12 hr tablet Take 2 tablets (200 mg total) by mouth 2 (two) times  daily. 10/02/20 10/02/21  Patrecia Pour, NP  busPIRone (BUSPAR) 30 MG tablet Take 0.5 tablets (15 mg total) by mouth daily. 10/02/20 11/01/20  Patrecia Pour, NP  Cyanocobalamin (VITAMIN B-12) 5000 MCG TBDP Take 5,000 mcg by mouth daily.    [provider]  diclofenac Sodium (VOLTAREN) 1 % GEL Apply 2 g topically 4 (four) times daily. 10/24/20   Caccavale, Sophia, PA-C  DULoxetine (CYMBALTA) 20 MG capsule Take 1 capsule (20 mg total) by mouth daily. 10/02/20 11/01/20  Patrecia Pour, NP  fluticasone (FLONASE) 50 MCG/ACT nasal spray Place 1 spray into both nostrils in the morning and at bedtime.    [provider]  fluticasone (FLOVENT HFA) 110 MCG/ACT inhaler Inhale 2 puffs into the lungs 2 (two) times daily. 06/02/19   Valentina Shaggy, MD  folic acid (FOLVITE) 1 MG tablet Take 1 tablet (1 mg total) by mouth daily. 08/24/20 11/22/20  Antonieta Pert, MD  gabapentin (NEURONTIN) 100 MG capsule Take 1 capsule (100 mg total) by mouth 3 (three) times daily. 10/02/20 10/02/21  Patrecia Pour, NP  methocarbamol (ROBAXIN) 500 MG tablet Take 1 tablet (500 mg total) by mouth 2 (two) times daily as needed for muscle spasms. 10/24/20   Caccavale, Sophia, PA-C  montelukast (SINGULAIR) 10 MG tablet Take 1 tablet (10 mg total) by mouth at bedtime. 08/18/20   Dwyane Dee, MD  Multiple Vitamin (MULTIVITAMIN WITH MINERALS) TABS tablet Take 1 tablet by mouth daily. 09/14/20   Love, Ivan Anchors, PA-C  rOPINIRole (REQUIP) 3 MG tablet TAKE 1 TABLET(3 MG) BY MOUTH AT BEDTIME 08/18/20   Patrecia Pour, NP  senna-docusate (SENOKOT-S) 8.6-50 MG tablet Take 2 tablets by mouth daily. 09/14/20   Love, Ivan Anchors, PA-C  silver sulfADIAZINE (SILVADENE) 1 % cream Apply topically 2 (two) times daily.  09/13/20   Love, Ivan Anchors, PA-C  tamsulosin (FLOMAX) 0.4 MG CAPS capsule Take 1 capsule (0.4 mg total) by mouth daily after supper. 09/13/20   Love, Ivan Anchors, PA-C  traMADol (ULTRAM) 50 MG tablet Take 1 tablet (50 mg total) by mouth  every 6 (six) hours as needed for severe pain. 09/13/20   Love, Ivan Anchors, PA-C  traZODone (DESYREL) 50 MG tablet Take 0.5 tablets (25 mg total) by mouth at bedtime. 10/02/20   Patrecia Pour, NP  Zinc Sulfate 220 (50 Zn) MG TABS Take 1 tablet (220 mg total) by mouth daily. 09/14/20   Love, Ivan Anchors, PA-C    Allergies    Bee venom, Penicillins, Penicillin g, Adhesive [tape], Latex, and Vancomycin  Review of Systems   Review of Systems  Constitutional:  Negative for fever.  HENT:  Negative for ear pain.   Eyes:  Negative for pain.  Respiratory:  Negative for cough.   Cardiovascular:  Negative for chest pain.  Gastrointestinal:  Negative for abdominal pain.  Genitourinary:  Negative for flank pain.  Musculoskeletal:  Positive for back pain.  Skin:  Negative for rash.  Neurological:  Negative for headaches.   Physical Exam Updated Vital Signs BP 116/84   Pulse 84   Temp 98.2 F (36.8 C) (Oral)   Resp 14   Ht 5\' 3"  (1.6 m)   Wt (!) 203.7 kg   LMP 10/01/2020   SpO2 100%   BMI 79.54 kg/m   Physical Exam Constitutional:      General: She is not in acute distress.    Appearance: Normal appearance.     Comments: Morbid obesity  HENT:     Head: Normocephalic.     Nose: Nose normal.  Eyes:     Extraocular Movements: Extraocular movements intact.  Cardiovascular:     Rate and Rhythm: Normal rate.  Pulmonary:     Effort: Pulmonary effort is normal.  Musculoskeletal:        General: Normal range of motion.     Cervical back: Normal range of motion.  Neurological:     General: No focal deficit present.     Mental Status: She is alert. Mental status is at baseline.    ED Results / Procedures / Treatments   Labs (all labs ordered are listed, but only abnormal results are displayed) Labs Reviewed  RESP PANEL BY RT-PCR (FLU A&B, COVID) ARPGX2  CBC WITH DIFFERENTIAL/PLATELET  COMPREHENSIVE METABOLIC PANEL  URINALYSIS, ROUTINE W REFLEX MICROSCOPIC  PREGNANCY, URINE  RAPID  URINE DRUG SCREEN, HOSP PERFORMED  ETHANOL    EKG None  Radiology CT ABDOMEN PELVIS WO CONTRAST  Result Date: 10/24/2020 CLINICAL DATA:  Abdominal pain, nausea/vomiting EXAM: CT ABDOMEN AND PELVIS WITHOUT CONTRAST TECHNIQUE: Multidetector CT imaging of the abdomen and pelvis was performed following the standard protocol without IV contrast. COMPARISON:  08/18/2020 FINDINGS: Lower chest: Lung bases are clear. Hepatobiliary: Severe hepatic steatosis. Status post cholecystectomy. No intrahepatic or extrahepatic ductal dilatation. Pancreas: Within normal limits. Spleen: Within normal limits. Adrenals/Urinary Tract: Adrenal glands are within normal limits. 8.3 cm right upper pole renal cyst, measuring simple fluid density, likely benign. Three nonobstructing right renal calculi, measuring up to 1.9 cm in the interpolar region (coronal image 34). Left lower pole renal staghorn calculi measuring up to 5.3 cm (coronal image 85). No hydronephrosis. Bladder is within normal limits. Stomach/Bowel: Stomach is notable for a tiny hiatal hernia. No evidence of bowel obstruction. Normal appendix (series 3/image 60). No colonic  wall thickening or inflammatory changes. Vascular/Lymphatic: No evidence of abdominal aortic aneurysm. No suspicious abdominopelvic lymphadenopathy. Reproductive: Uterus is within normal limits. Bilateral ovaries are within normal limits. Other: No abdominopelvic ascites. Musculoskeletal: Grade 1 spondylolisthesis at L5-S1. Degenerative changes of the visualized thoracolumbar spine. IMPRESSION: Bilateral nonobstructing renal calculi, including a 5.3 cm left renal staghorn calculus. No hydronephrosis. Severe hepatic steatosis. Additional stable ancillary findings as above. Electronically Signed   By: Julian Hy M.D.   On: 10/24/2020 19:43   DG Chest 2 View  Result Date: 10/24/2020 CLINICAL DATA:  Chest tightness for 3 days. EXAM: CHEST - 2 VIEW COMPARISON:  Single-view of the chest  08/18/2020. PA and lateral chest 06/22/2018. FINDINGS: Lungs clear. Heart size normal. No pneumothorax or pleural fluid. No acute or focal bony abnormality. IMPRESSION: Negative chest. Electronically Signed   By: Inge Rise M.D.   On: 10/24/2020 14:51    Procedures Procedures   Medications Ordered in ED Medications - No data to display  ED Course  I have reviewed the triage vital signs and the nursing notes.  Pertinent labs & imaging results that were available during my care of the patient were reviewed by me and considered in my medical decision making (see chart for details).    MDM Rules/Calculators/A&P                           Labs have been ordered for psych evaluation.  Patient pending TTS evaluation after clearance.  Final Clinical Impression(s) / ED Diagnoses Final diagnoses:  Suicidal thoughts    Rx / DC Orders ED Discharge Orders     None        Luna Fuse, MD 10/26/20 [phone]

## 2020-10-26 NOTE — ED Triage Notes (Addendum)
Pt was discharged from [facility] after being tx for UTI.  Pt is homeless and was in hotel room.  Pt was unable to pick up rx for UTI. CBG 109. Pt also states she wants to end her life, though has not specific plans.

## 2020-10-26 NOTE — ED Notes (Signed)
Pt wanded by security. No sitter available.

## 2020-10-27 DIAGNOSIS — F4323 Adjustment disorder with mixed anxiety and depressed mood: Secondary | ICD-10-CM | POA: Diagnosis present

## 2020-10-27 MED ORDER — TRAMADOL HCL 50 MG PO TABS: 50.0000 mg | ORAL_TABLET | Freq: Four times a day (QID) | ORAL | Status: DC | PRN

## 2020-10-27 MED ORDER — ROPINIROLE HCL 1 MG PO TABS: 3.0000 mg | ORAL_TABLET | Freq: Every day | ORAL | Status: DC

## 2020-10-27 MED ORDER — METHOCARBAMOL 500 MG PO TABS
500.0000 mg | ORAL_TABLET | Freq: Two times a day (BID) | ORAL | Status: DC | PRN
  Administered 2020-10-27: 500 mg via ORAL
  Filled 2020-10-27: qty 1

## 2020-10-27 MED ORDER — TRAZODONE HCL 50 MG PO TABS: 25.0000 mg | ORAL_TABLET | Freq: Every day | ORAL | Status: DC

## 2020-10-27 MED ORDER — BUSPIRONE HCL 10 MG PO TABS
15.0000 mg | ORAL_TABLET | Freq: Every day | ORAL | Status: DC
  Administered 2020-10-27: 15 mg via ORAL
  Filled 2020-10-27 (×2): qty 2

## 2020-10-27 MED ORDER — GABAPENTIN 100 MG PO CAPS
100.0000 mg | ORAL_CAPSULE | Freq: Three times a day (TID) | ORAL | Status: DC
  Administered 2020-10-27: 100 mg via ORAL
  Filled 2020-10-27: qty 1

## 2020-10-27 MED ORDER — BUPROPION HCL ER (SR) 100 MG PO TB12
200.0000 mg | ORAL_TABLET | Freq: Two times a day (BID) | ORAL | Status: DC
  Administered 2020-10-27: 200 mg via ORAL
  Filled 2020-10-27: qty 2

## 2020-10-27 MED ORDER — MONTELUKAST SODIUM 10 MG PO TABS: 10.0000 mg | ORAL_TABLET | Freq: Every day | ORAL | Status: DC

## 2020-10-27 NOTE — ED Notes (Signed)
Sitter at bedside.

## 2020-10-27 NOTE — Progress Notes (Signed)
..Transition of Care [facility]) - Emergency Department Mini Assessment   Patient Details  Name: Heather [name] MRN: [id number] Date of Birth: Dec 09, 1975  Transition of Care [facility]) CM/SW Contact:    Illene Regulus, LCSW Phone Number: 10/27/2020, 11:13 AM   Clinical Narrative: CSW spoke with pt over the phone, pt reported being homeless, looking for Long term care. CSW informed pt that she does not meet the requirements for Long Term Placement, and she would not be able to sit in the ED until she finds placement. After hearing this information pt hung up on CSW. CSW went down to pt room and provided shelter resources, and asked if she need transportation. Pt reported that her mother will be picking her upfront the hospital. Substance abuse resources has already been attached. CSW informed pt that she can go to the [facility] to help find shelter today.   Arlie Solomons.Fareed Fung, MSW, [place]  Transitions of Care Clinical Social Worker I Direct Dial: [phone]  Fax: [phone] [email or web address]  ED Mini Assessment:             Interventions which prevented an admission or readmission: Transportation Screening, SNF Placement, IRC, Homeless Screening    Patient Contact and Communications        ,                 Admission diagnosis:  Back Pain, Uti Patient Active Problem List   Diagnosis Date Noted   Major depression, chronic 09/13/2020   Insomnia    Anemia of chronic disease    Hyponatremia    AKI (acute kidney injury) ([place])    Chronic bilateral low back pain without sciatica    Debility 08/24/2020   SIRS (systemic inflammatory response syndrome) ([place]) 08/18/2020   Pressure injury of skin 08/11/2020   Rhabdomyolysis 08/10/2020   Acute renal failure ([place]) 08/10/2020   PTSD (post-traumatic stress disorder) 05/15/2020   Major depressive disorder, recurrent episode, moderate ([place]) 05/05/2020   Generalized anxiety  disorder 05/05/2020   Blood in stool    Gastritis and gastroduodenitis    Benign neoplasm of sigmoid colon    Difficult intubation 08/10/2018   Class 3 severe obesity with serious comorbidity and body mass index (BMI) greater than or equal to 70 in adult ([place]) 06/16/2018   Nexplanon insertion 03/27/2017   Postpartum hypertension 03/05/2017   Status post primary low transverse cesarean section 02/03/2017   H/O pre-eclampsia 01/27/2017   Gestational htn w/o significant proteinuria, third trimester 01/20/2017   Mild persistent asthma without complication [id number]   Perennial allergic rhinitis 11/05/2016   Mild persistent asthma with acute exacerbation 11/05/2016   Excess weight gain in pregnancy, second trimester 10/30/2016   Hard to intubate 11/07/2015   Hypoglycemia 11/07/2015   OSA (obstructive sleep apnea) 11/07/2015   DM type 2 (diabetes mellitus, type 2) ([place]) 12/07/2014   Panniculitis 12/06/2014   Cellulitis, abdominal wall 11/11/2014   Abdominal pain 07/14/2014   Loose stools 07/14/2014   Melena [id number]   Eosinophilic esophagitis [id number]   Change in bowel habits 04/28/2013   Esophageal dysphagia 04/28/2013   Insomnia due to mental disorder(327.02) 08/08/2011   RLS (restless legs syndrome) 08/08/2011   PALPITATIONS, OCCASIONAL 11/01/2009   DISORDER, TOBACCO USE 08/25/2008   Leucocytosis 07/28/2008   ALLERGIC RHINITIS, SEASONAL [id number]   DYSMETABOLIC SYNDROME [id number]   Morbid obesity ([place]) 05/13/2006   EXTERNAL HEMORRHOIDS 05/13/2006   HYPERLIPIDEMIA 05/12/2006   Essential hypertension 05/12/2006   ASTHMA 05/12/2006   GERD (  gastroesophageal reflux disease) 05/12/2006   OSTEOARTHRITIS 05/12/2006   PCP:  Lucianne Lei, MD Pharmacy:   [facility] [place], [place] [address] [place] [address] Phone: [phone] Fax: [phone]

## 2020-10-27 NOTE — Consult Note (Signed)
[facility] Face-to-Face Psychiatry Consult   Reason for Consult:  SI Referring Physician:  Lacretia Leigh, MD Patient Identification: Heather [name] MRN:  [id number] Principal Diagnosis: Adjustment disorder with mixed anxiety and depressed mood Diagnosis:  Principal Problem:   Adjustment disorder with mixed anxiety and depressed mood   Total Time spent with patient: 20 minutes  Subjective:   Heather [name] is a 45 y.o. female patient admitted with passive SI without plan. Patient presents alert and oriented; laying in bed with sitter at the bedside.    States she was evicted during last hospitalization for sepsis x2 months. States she doesn't have any family supports. Theodoro Kos has been paying for hotel room but recently exhausted funds; states she has called around to local shelters and no one has any room. "I can't sleep on the streets because I have too many medical issues".  She denies any suicidal or homicidal ideations, auditory or visual hallucinations, is not actively psychotic or responding to any external/internal stimuli. Provider discussed at length available resources to address her social needs including but not limited to local shelters, [facility], [facility] outpatient services, substance abuse/recovery services; patient expressed some interest in [facility] focused on long-term residential services due to social needs. She declined any substance abuse services at this time. Provider explained inpatient hospitalization criteria and that she currently does not meet; patient stated she needs physical rehab placement due to her chronic physical needs and emotional distress in her adjusting to recent homelessness. TOC consult placed to address social needs.   TOC note 10/27/20 1113 "CSW spoke with pt over the phone, pt reported being homeless, looking for Long term care. CSW informed pt that she does not meet the requirements for Long Term Placement, and she would not be able to sit in the ED until she  finds placement. After hearing this information pt hung up on CSW. CSW went down to pt room and provided shelter resources, and asked if she need transportation. Pt reported that her mother will be picking her upfront the hospital. Substance abuse resources has already been attached. CSW informed pt that she can go to the [facility] to help find shelter today".  HPI:   Heather [name] Fell is a 45 year old female patient admitted to [facility] with SI no plan. Patient recently became homeless and was discharged from [facility] after being treated for UTI 10/25/20. Presented to [facility] 10/26/20 stating she "needed rehab" for physical ailments, further stated she "does not want to live anymore if she has to continue to live on the streets". DSS took temporary custody (twin 85 year olds) while she was in the hospital. Says she is unable to work due medical issues; and has SSI hearing 11/17/20. UDS+ cocaine.   Past Psychiatric History:   -MDD, recurrent epsidoe, moderate  -PTSD  Risk to Self:  pt denies Risk to Others:  pt denies Prior Inpatient Therapy:  no Prior Outpatient Therapy:  yes  Past Medical History:  Past Medical History:  Diagnosis Date   Allergy    Amenorrhea    Anemia    post partum    Anxiety    Anxiety    Arthritis    Asthma    Back pain    Constipation    COPD (chronic obstructive pulmonary disease) ([place])    Delivery with history of C-section    Depression    Depression    Diabetes mellitus without complication ([place])    patient denies but states she has hyperglycemia-diet controlled  Dysmenorrhea    Dysrhythmia    DR Johnsie Cancel     Ectopic pregnancy 2013   Edema, lower extremity    Eosinophilic esophagitis    Diagnosed at [facility] 06/16/2013, untreated   Gallbladder problem    GERD (gastroesophageal reflux disease)    HEARTBURN   TUMS   Hard to intubate 11/07/2015   High cholesterol    IBS (irritable bowel syndrome)    Leukocytosis 07/28/2008   Qualifier: Diagnosis of  By: Jonna Munro MD,  [place]     Morbid obesity [facility])    Neuromuscular disorder ([place])    RESTLESS LEG    Obesity    Schizoaffective disorder, bipolar type ([place])    Sepsis ([place]) 11/11/2014   Shortness of breath    WITH EXERTION    Sleep apnea    CPAP- in process of restarting     Past Surgical History:  Procedure Laterality Date   BIOPSY  08/13/2018   Procedure: BIOPSY;  Surgeon: Yetta Flock, MD;  Location: [facility];  Service: Gastroenterology;;   CESAREAN SECTION MULTI-GESTATIONAL N/A 02/03/2017   Procedure: [place];  Surgeon: Jonnie Kind, MD;  Location: [place];  Service: Obstetrics;  Laterality: N/A;   CHOLECYSTECTOMY     COLONOSCOPY WITH PROPOFOL N/A 08/13/2018   Procedure: COLONOSCOPY WITH PROPOFOL;  Surgeon: Yetta Flock, MD;  Location: [facility];  Service: Gastroenterology;  Laterality: N/A;   DENTAL SURGERY     ESOPHAGOGASTRODUODENOSCOPY  May 2007   Dr. Gala Romney: Normal esophagus, stomach, D1, D2   ESOPHAGOGASTRODUODENOSCOPY  06/16/2013   Dr. Carlton Adam, eosinophilic esophagitis, reactive gastropathy, no esophageal dilation   ESOPHAGOGASTRODUODENOSCOPY (EGD) WITH PROPOFOL N/A 08/13/2018   Procedure: ESOPHAGOGASTRODUODENOSCOPY (EGD) WITH PROPOFOL;  Surgeon: Yetta Flock, MD;  Location: [facility];  Service: Gastroenterology;  Laterality: N/A;   POLYPECTOMY  08/13/2018   Procedure: POLYPECTOMY;  Surgeon: Yetta Flock, MD;  Location: [facility];  Service: Gastroenterology;;   TONSILLECTOMY     TOOTH EXTRACTION  10/28/2011   Procedure: DENTAL RESTORATION/EXTRACTIONS;  Surgeon: Gae Bon, DDS;  Location: [facility] OR;  Service: Oral Surgery;;   UPPER GASTROINTESTINAL ENDOSCOPY     Family History:  Family History  Problem Relation Age of Onset   Depression Mother    Anxiety disorder Mother    High blood pressure Mother    Bipolar disorder Mother    Eating disorder Mother    Obesity Mother    Hypertension Sister    Allergic  rhinitis Sister    Colon polyps Maternal Grandmother        54s   Diabetes Maternal Grandmother    Anxiety disorder Maternal Grandmother    COPD Maternal Grandmother    Crohn's disease Maternal Aunt    Cancer Maternal Grandfather        prostate   HIV/AIDS Father    Eating disorder Father    Obesity Father    Liver disease Neg Hx    Angioedema Neg Hx    Eczema Neg Hx    Immunodeficiency Neg Hx    Asthma Neg Hx    Urticaria Neg Hx    Colon cancer Neg Hx    Esophageal cancer Neg Hx    Rectal cancer Neg Hx    Stomach cancer Neg Hx    Family Psychiatric  History: not noted Social History:  Social History   Substance and Sexual Activity  Alcohol Use No     Social History   Substance and Sexual Activity  Drug  Use No    Social History   Socioeconomic History   Marital status: Legally Separated    Spouse name: Gwyndolyn Saxon   Number of children: 0   Years of education: Not on file   Highest education level: Not on file  Occupational History   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 had a pack the other day  Other Topics Concern   Not on file  Social History Narrative   Not on file   Social Determinants of Health   Financial Resource Strain: Not on file  Food Insecurity: Not on file  Transportation Needs: Not on file  Physical Activity: Not on file  Stress: Not on file  Social Connections: Not on file   Additional Social History:    Allergies:   Allergies  Allergen Reactions   Amoxicillin Anaphylaxis   Bee Venom Shortness Of Breath   Penicillins Anaphylaxis     Has patient had a PCN reaction causing immediate rash, facial/tongue/throat swelling, SOB or lightheadedness with hypotension: No Has patient had a PCN reaction causing severe rash involving mucus membranes or skin necrosis: No Has patient had a PCN reaction that required hospitalization No Has patient had a PCN reaction occurring within the last 10 years: No If all of the above answers are "NO", then may proceed with Cephalosporin use.'  REACTION: Angioedema   Penicillin G    Adhesive [Tape] Rash   Latex Rash   Vancomycin Other (See Comments)    Pt can tolerate Vancomycin but did cause Vancomycin Infusion Reaction.  Recommend to pre-medicate with Benadryl before doses administered.      Labs:  Results for orders placed or performed during the hospital encounter of 10/26/20 (from the past 48 hour(s))  CBC with Differential     Status: Abnormal   Collection Time: 10/26/20  1:46 PM  Result Value Ref Range   WBC 10.7 (H) 4.0 - 10.5 K/uL   RBC 4.63 3.87 - 5.11 MIL/uL   Hemoglobin 11.7 (L) 12.0 - 15.0 g/dL   HCT 40.4 36.0 - 46.0 %   MCV 87.3 80.0 - 100.0 fL   MCH 25.3 (L) 26.0 - 34.0 pg   MCHC 29.0 (L) 30.0 - 36.0 g/dL   RDW 19.8 (H) 11.5 - 15.5 %   Platelets 493 (H) 150 - 400 K/uL   nRBC 0.0 0.0 - 0.2 %   Neutrophils Relative % 71 %   Neutro Abs 7.6 1.7 - 7.7 K/uL   Lymphocytes Relative 19 %   Lymphs Abs 2.0 0.7 - 4.0 K/uL   Monocytes Relative 6 %   Monocytes Absolute 0.6 0.1 - 1.0 K/uL   Eosinophils Relative 4 %   Eosinophils Absolute 0.4 0.0 - 0.5 K/uL   Basophils Relative 0 %   Basophils Absolute 0.0 0.0 - 0.1 K/uL   Immature Granulocytes 0 %   Abs Immature Granulocytes 0.04 0.00 - 0.07 K/uL    Comment: Performed at [facility], [place]  [address], [place], [place] [address]  Comprehensive metabolic panel     Status: Abnormal   Collection Time: 10/26/20  1:46 PM  Result Value Ref Range   Sodium 138 135 - 145 mmol/L   Potassium 3.5 3.5 - 5.1 mmol/L    Chloride 102 98 - 111 mmol/L   CO2 23 22 - 32 mmol/L   Glucose, Bld 85 70 - 99 mg/dL    Comment: Glucose reference range applies only to samples taken after fasting for at least 8 hours.   BUN 14 6 - 20 mg/dL   Creatinine, Ser 1.77 (H) 0.44 - 1.00 mg/dL   Calcium 9.6 8.9 - 10.3 mg/dL   Total Protein 7.5 6.5 - 8.1 g/dL   Albumin 3.4 (L) 3.5 - 5.0 g/dL   AST 28 15 - 41 U/L   ALT 25 0 - 44 U/L   Alkaline Phosphatase 61 38 - 126 U/L   Total Bilirubin 0.6 0.3 - 1.2 mg/dL   GFR, Estimated 36 (L) >60 mL/min    Comment: (NOTE) Calculated using the CKD-EPI Creatinine Equation (2021)    Anion gap 13 5 - 15    Comment: Performed at [facility], [place] [address] [place], [place] [address]  Ethanol     Status: None   Collection Time: 10/26/20  1:46 PM  Result Value Ref Range   Alcohol, Ethyl (B) <10 <10 mg/dL    Comment: (NOTE) Lowest detectable limit for serum alcohol is 10 mg/dL.  For medical purposes only. Performed at [facility], [place] [address], [place], [place] [address]   Resp Panel by RT-PCR (Flu A&B, Covid) Nasopharyngeal Swab     Status: None   Collection Time: 10/26/20  1:46 PM   Specimen: Nasopharyngeal Swab; Nasopharyngeal(NP) swabs in vial transport medium  Result Value Ref Range   SARS Coronavirus 2 by RT PCR NEGATIVE NEGATIVE    Comment: (NOTE) SARS-CoV-2 target nucleic acids are NOT DETECTED.  The SARS-CoV-2 RNA is generally detectable in upper respiratory specimens during the acute phase of infection. The lowest concentration of SARS-CoV-2 viral copies this assay can detect is 138 copies/mL. A negative result does not preclude SARS-Cov-2 infection and should not be used as the sole basis for treatment or other patient management decisions. A negative result may occur with  improper specimen collection/handling, submission of specimen other than nasopharyngeal swab, presence of viral mutation(s) within the areas targeted by this  assay, and inadequate number of viral copies(<138 copies/mL). A negative result must be combined with clinical observations, patient history, and epidemiological information. The expected result is Negative.  Fact Sheet for Patients:  EntrepreneurPulse.com.au  Fact Sheet for Healthcare Providers:  IncredibleEmployment.be  This test is no t yet approved or cleared by the [place] FDA and  has been authorized for detection and/or diagnosis of SARS-CoV-2 by FDA under an Emergency Use Authorization (EUA). This EUA will remain  in effect (meaning this test can be used) for the duration of the COVID-19 declaration under Section 564(b)(1) of the Act, 21 U.S.C.section 360bbb-3(b)(1), unless the authorization is terminated  or revoked sooner.       Influenza A by PCR NEGATIVE NEGATIVE   Influenza B by PCR NEGATIVE NEGATIVE    Comment: (NOTE) The Xpert Xpress SARS-CoV-2/FLU/RSV plus assay is intended as an aid in the diagnosis of influenza from Nasopharyngeal swab specimens and should not be used as a sole basis for treatment. Nasal washings and aspirates are unacceptable for Xpert Xpress SARS-CoV-2/FLU/RSV  testing.  Fact Sheet for Patients: EntrepreneurPulse.com.au  Fact Sheet for Healthcare Providers: IncredibleEmployment.be  This test is not yet approved or cleared by the [place] FDA and has been authorized for detection and/or diagnosis of SARS-CoV-2 by FDA under an Emergency Use Authorization (EUA). This EUA will remain in effect (meaning this test can be used) for the duration of the COVID-19 declaration under Section 564(b)(1) of the Act, 21 U.S.C. section 360bbb-3(b)(1), unless the authorization is terminated or revoked.  Performed at [facility], [place] [address], [place], [place] [address]   Urinalysis, Routine w reflex microscopic Urine, Clean Catch     Status: Abnormal    Collection Time: 10/26/20  6:00 PM  Result Value Ref Range   Color, Urine YELLOW YELLOW   APPearance CLOUDY (A) CLEAR   Specific Gravity, Urine 1.012 1.005 - 1.030   pH 6.0 5.0 - 8.0   Glucose, UA NEGATIVE NEGATIVE mg/dL   Hgb urine dipstick MODERATE (A) NEGATIVE   Bilirubin Urine NEGATIVE NEGATIVE   Ketones, ur 5 (A) NEGATIVE mg/dL   Protein, ur 30 (A) NEGATIVE mg/dL   Nitrite NEGATIVE NEGATIVE   Leukocytes,Ua LARGE (A) NEGATIVE   RBC / HPF 21-50 0 - 5 RBC/hpf   WBC, UA >50 (H) 0 - 5 WBC/hpf   Bacteria, UA MANY (A) NONE SEEN   Squamous Epithelial / LPF 21-50 0 - 5   WBC Clumps PRESENT    Mucus PRESENT    Non Squamous Epithelial 0-5 (A) NONE SEEN    Comment: Performed at [facility], [place] [address] [place], [place] [address]  Pregnancy, urine     Status: None   Collection Time: 10/26/20  6:00 PM  Result Value Ref Range   Preg Test, Ur NEGATIVE NEGATIVE    Comment:        THE SENSITIVITY OF THIS METHODOLOGY IS >20 mIU/mL. Performed at [facility], [place] [address], [place], [place] [address]   Rapid urine drug screen (hospital performed)     Status: Abnormal   Collection Time: 10/26/20  6:00 PM  Result Value Ref Range   Opiates NONE DETECTED NONE DETECTED   Cocaine POSITIVE (A) NONE DETECTED   Benzodiazepines NONE DETECTED NONE DETECTED   Amphetamines NONE DETECTED NONE DETECTED   Tetrahydrocannabinol NONE DETECTED NONE DETECTED   Barbiturates NONE DETECTED NONE DETECTED    Comment: (NOTE) DRUG SCREEN FOR MEDICAL PURPOSES ONLY.  IF CONFIRMATION IS NEEDED FOR ANY PURPOSE, NOTIFY LAB WITHIN 5 DAYS.  LOWEST DETECTABLE LIMITS FOR URINE DRUG SCREEN Drug Class                     Cutoff (ng/mL) Amphetamine and metabolites    1000 Barbiturate and metabolites    200 Benzodiazepine                 [id number] Tricyclics and metabolites     300 Opiates and metabolites        300 Cocaine and metabolites        300 THC                             50 Performed at [facility], [place] [address], [place], [place] [address]     No current facility-administered medications for this encounter.   Current Outpatient Medications  Medication Sig Dispense Refill   acetaminophen (TYLENOL) 325 MG tablet Take 650 mg by  mouth every 6 (six) hours as needed for mild pain, fever or headache.     albuterol (VENTOLIN HFA) 108 (90 Base) MCG/ACT inhaler INHALE 2 PUFFS INTO THE LUNGS EVERY 4 HOURS AS NEEDED FOR WHEEZING OR SHORTNESS OF BREATH (Patient taking differently: Inhale 2 puffs into the lungs every 4 (four) hours as needed for wheezing or shortness of breath.) 6.7 g 1   ascorbic acid (VITAMIN C) 500 MG tablet Take 1 tablet (500 mg total) by mouth daily. 30 tablet 0   buPROPion (WELLBUTRIN SR) 100 MG 12 hr tablet Take 2 tablets (200 mg total) by mouth 2 (two) times daily. 60 tablet 2   busPIRone (BUSPAR) 30 MG tablet Take 0.5 tablets (15 mg total) by mouth daily. 60 tablet 2   Cyanocobalamin (VITAMIN B-12) 5000 MCG TBDP Take 5,000 mcg by mouth daily.     diclofenac Sodium (VOLTAREN) 1 % GEL Apply 2 g topically 4 (four) times daily. 100 g 0   fluticasone (FLONASE) 50 MCG/ACT nasal spray Place 2 sprays into both nostrils 2 (two) times daily.     fluticasone (FLOVENT HFA) 110 MCG/ACT inhaler Inhale 2 puffs into the lungs 2 (two) times daily. 1 Inhaler 5   gabapentin (NEURONTIN) 100 MG capsule Take 1 capsule (100 mg total) by mouth 3 (three) times daily. 90 capsule 2   methocarbamol (ROBAXIN) 500 MG tablet Take 1 tablet (500 mg total) by mouth 2 (two) times daily as needed for muscle spasms. 10 tablet 0   montelukast (SINGULAIR) 10 MG tablet Take 1 tablet (10 mg total) by mouth at bedtime. 30 tablet 5   rOPINIRole (REQUIP) 3 MG tablet TAKE 1 TABLET(3 MG) BY MOUTH AT BEDTIME (Patient taking differently: Take 3 mg by mouth at bedtime.) 90 tablet 0   rOPINIRole (REQUIP) 4 MG tablet Take 4 mg by mouth at bedtime.     senna-docusate  (SENOKOT-S) 8.6-50 MG tablet Take 2 tablets by mouth daily. 60 tablet 0   silver sulfADIAZINE (SILVADENE) 1 % cream Apply topically 2 (two) times daily. (Patient taking differently: Apply 1 application topically 2 (two) times daily.) 400 g 0   tamsulosin (FLOMAX) 0.4 MG CAPS capsule Take 1 capsule (0.4 mg total) by mouth daily after supper. 30 capsule 0   traMADol (ULTRAM) 50 MG tablet Take 1 tablet (50 mg total) by mouth every 6 (six) hours as needed for severe pain. 20 tablet 0   traZODone (DESYREL) 50 MG tablet Take 0.5 tablets (25 mg total) by mouth at bedtime. 30 tablet 2   Zinc Sulfate 220 (50 Zn) MG TABS Take 1 tablet (220 mg total) by mouth daily. 30 tablet 0   DULoxetine (CYMBALTA) 20 MG capsule Take 1 capsule (20 mg total) by mouth daily. (Patient not taking: No sig reported) 30 capsule 0   folic acid (FOLVITE) 1 MG tablet Take 1 tablet (1 mg total) by mouth daily. (Patient not taking: No sig reported) 30 tablet 2   Multiple Vitamin (MULTIVITAMIN WITH MINERALS) TABS tablet Take 1 tablet by mouth daily. (Patient not taking: No sig reported)      Musculoskeletal: Strength & Muscle Tone: within normal limits and decreased Gait & Station:  unable to assess Patient leans: N/A  Psychiatric Specialty Exam:  Presentation  General Appearance:  Casual Eye Contact: Good Speech: Clear and Coherent Speech Volume: Normal Handedness: Left  Mood and Affect  Mood: Euthymic Affect: Congruent  Thought Process  Thought Processes: Coherent Descriptions of Associations:Intact Orientation:Full (Time, Place and Person) Thought  Content:WDL History of Schizophrenia/Schizoaffective disorder:No  Duration of Psychotic Symptoms:No data recorded Hallucinations:Hallucinations: None Ideas of Reference:None Suicidal Thoughts:Suicidal Thoughts: Yes, Passive SI Passive Intent and/or Plan: Without Intent; Without Plan Homicidal Thoughts:Homicidal Thoughts: No  Sensorium  Memory: Immediate  Good; Recent Good; Remote Good Judgment: Intact Insight: Present  Executive Functions  Concentration: Fair Attention Span: Fair Recall: [place] of Knowledge: Fair Language: No data recorded  Psychomotor Activity  Psychomotor Activity: Psychomotor Activity: Normal  Assets  Assets: Resilience; Desire for Improvement; Communication Skills  Sleep  Sleep: Sleep: Good  Physical Exam: Physical Exam Vitals and nursing note reviewed.  Constitutional:      General: She is not in acute distress.    Appearance: She is obese. She is not ill-appearing, toxic-appearing or diaphoretic.  HENT:     Head: Normocephalic.     Nose: Nose normal.     Mouth/Throat:     Mouth: Mucous membranes are moist.  Eyes:     Pupils: Pupils are equal, round, and reactive to light.  Cardiovascular:     Rate and Rhythm: Normal rate.  Pulmonary:     Effort: Pulmonary effort is normal.  Skin:    General: Skin is warm.  Neurological:     General: No focal deficit present.     Mental Status: She is alert.  Psychiatric:        Attention and Perception: Attention and perception normal.        Mood and Affect: Affect is blunt.        Speech: Speech normal.        Behavior: Behavior normal. Behavior is cooperative.        Thought Content: Thought content normal. Thought content does not include homicidal or suicidal ideation. Thought content does not include homicidal or suicidal plan.        Cognition and Memory: Cognition and memory normal.        Judgment: Judgment normal.   Review of Systems  Psychiatric/Behavioral:  Positive for depression and substance abuse. Negative for hallucinations, memory loss and suicidal ideas. The patient is not nervous/anxious and does not have insomnia.   All other systems reviewed and are negative. Blood pressure 120/62, pulse 78, temperature 98.3 F (36.8 C), temperature source Oral, resp. rate 16, height '5\' 3"'$  (1.6 m), weight (!) 203.7 kg, last menstrual  period 10/01/2020, SpO2 96 %. Body mass index is 79.54 kg/m.  Treatment Plan Summary: Plan Discharge patient with psychiatric outpatient resources for individual follow-up. TOC consult placed to assist with social needs.   Disposition: No evidence of imminent risk to self or others at present.   Patient does not meet criteria for psychiatric inpatient admission. Supportive therapy provided about ongoing stressors.  Inda Merlin, NP 10/27/2020 6:19 PM

## 2020-10-27 NOTE — ED Notes (Signed)
Breakfast tray given. °
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
## 2020-10-27 NOTE — ED Provider Notes (Signed)
Emergency Medicine Observation Re-evaluation Note  Heather [name] is a 45 y.o. female, seen on rounds today.  Pt initially presented to the ED for complaints of Urinary Frequency Currently, the patient is resting in bed.  Physical Exam  BP 120/63   Pulse 76   Temp 98.2 F (36.8 C) (Oral)   Resp 17   Ht '5\' 3"'$  (1.6 m)   Wt (!) 203.7 kg   LMP 10/01/2020   SpO2 96%   BMI 79.54 kg/m  Physical Exam General: No acute distress Cardiac: Well-perfused Lungs: Nonlabored Psych: Cooperative  ED Course / MDM  EKG:   I have reviewed the labs performed to date as well as medications administered while in observation.  Recent changes in the last 24 hours include psychiatric evaluation.  Plan  Current plan is for patient to be discharged.  Per psychiatry she is cleared.  Heather [name] is not under involuntary commitment.     Hayden Rasmussen, MD 10/27/20 Bosie Helper

## 2020-10-27 NOTE — ED Notes (Signed)
TTS at bedside. 

## 2020-10-27 NOTE — BH Assessment (Signed)
[place] Assessment Progress Note   Per Oneida Alar, NP, this voluntary pt does not require psychiatric hospitalization at this time.  Pt is psychiatrically cleared.  Discharge instructions include referral information for area substance use disorder treatment providers.  A TOC consult has been ordered to address pt's psychosocial needs.  EDP Donnamarie Rossetti, MD, Christina Christovale, LCSW and pt's nurses, [place] and Gwyndolyn Saxon, have been notified.  Jalene Mullet, [place] Triage Specialist [phone]

## 2020-10-27 NOTE — Discharge Instructions (Signed)
To help you maintain a sober lifestyle, a substance abuse treatment program may be beneficial to you.  Contact one of the following providers at your earliest opportunity to ask about enrolling their program:  RESIDENTIAL PROGRAMS:       [place]      [place], [place] [address]      [phone]       [place].      [place], [place] [address]      [phone]  [place]:       [facility]      [place], [place] [address]      [phone]      Ask about their Substance Abuse Intensive Outpatient Program.  They also offer psychiatry/medication management and therapy.  New patients are seen in their walk-in clinic.  Walk-in hours are Monday - Thursday from 8:00 am - 11:00 am for psychiatry, and Friday from 1:00 pm - 4:00 pm for therapy.  Walk-in patients are seen on a first come, first served basis, so try to arrive as early as possible for the best chance of being seen the same day.

## 2020-10-28 LAB — URINE CULTURE

## 2020-10-30 ENCOUNTER — Other Ambulatory Visit: Payer: Self-pay

## 2020-10-30 ENCOUNTER — Encounter (HOSPITAL_COMMUNITY): Payer: Self-pay

## 2020-10-30 ENCOUNTER — Ambulatory Visit (HOSPITAL_COMMUNITY)
Admission: EM | Admit: 2020-10-30 | Discharge: 2020-10-30 | Disposition: A | Discharge status: Other Acute Inpatient Hospital | Payer: Medicaid Other | Attending: Psychiatry | Admitting: Psychiatry

## 2020-10-30 ENCOUNTER — Emergency Department (HOSPITAL_COMMUNITY): Payer: Medicaid Other

## 2020-10-30 ENCOUNTER — Inpatient Hospital Stay (HOSPITAL_COMMUNITY)
Admission: EM | Admit: 2020-10-30 | Discharge: 2021-01-05 | DRG: 690 | Disposition: A | Discharge status: Home / Self Care | Payer: Medicaid Other | Attending: Internal Medicine | Admitting: Internal Medicine

## 2020-10-30 ENCOUNTER — Telehealth: Payer: Medicaid Other | Admitting: Nurse Practitioner

## 2020-10-30 DIAGNOSIS — R630 Anorexia: Secondary | ICD-10-CM | POA: Diagnosis not present

## 2020-10-30 DIAGNOSIS — J069 Acute upper respiratory infection, unspecified: Secondary | ICD-10-CM | POA: Diagnosis not present

## 2020-10-30 DIAGNOSIS — Z8249 Family history of ischemic heart disease and other diseases of the circulatory system: Secondary | ICD-10-CM

## 2020-10-30 DIAGNOSIS — E662 Morbid (severe) obesity with alveolar hypoventilation: Secondary | ICD-10-CM | POA: Diagnosis present

## 2020-10-30 DIAGNOSIS — M25561 Pain in right knee: Secondary | ICD-10-CM

## 2020-10-30 DIAGNOSIS — Z9151 Personal history of suicidal behavior: Secondary | ICD-10-CM | POA: Insufficient documentation

## 2020-10-30 DIAGNOSIS — F332 Major depressive disorder, recurrent severe without psychotic features: Secondary | ICD-10-CM | POA: Insufficient documentation

## 2020-10-30 DIAGNOSIS — E876 Hypokalemia: Secondary | ICD-10-CM | POA: Diagnosis present

## 2020-10-30 DIAGNOSIS — D75839 Thrombocytosis, unspecified: Secondary | ICD-10-CM | POA: Diagnosis present

## 2020-10-30 DIAGNOSIS — K219 Gastro-esophageal reflux disease without esophagitis: Secondary | ICD-10-CM | POA: Diagnosis present

## 2020-10-30 DIAGNOSIS — Z20822 Contact with and (suspected) exposure to covid-19: Secondary | ICD-10-CM | POA: Diagnosis present

## 2020-10-30 DIAGNOSIS — F339 Major depressive disorder, recurrent, unspecified: Secondary | ICD-10-CM | POA: Diagnosis present

## 2020-10-30 DIAGNOSIS — Z888 Allergy status to other drugs, medicaments and biological substances status: Secondary | ICD-10-CM

## 2020-10-30 DIAGNOSIS — N179 Acute kidney failure, unspecified: Secondary | ICD-10-CM | POA: Diagnosis not present

## 2020-10-30 DIAGNOSIS — N183 Chronic kidney disease, stage 3 unspecified: Secondary | ICD-10-CM

## 2020-10-30 DIAGNOSIS — N1832 Chronic kidney disease, stage 3b: Secondary | ICD-10-CM | POA: Diagnosis present

## 2020-10-30 DIAGNOSIS — R079 Chest pain, unspecified: Secondary | ICD-10-CM

## 2020-10-30 DIAGNOSIS — Z818 Family history of other mental and behavioral disorders: Secondary | ICD-10-CM

## 2020-10-30 DIAGNOSIS — Z91040 Latex allergy status: Secondary | ICD-10-CM

## 2020-10-30 DIAGNOSIS — G2581 Restless legs syndrome: Secondary | ICD-10-CM | POA: Diagnosis present

## 2020-10-30 DIAGNOSIS — Z5900 Homelessness unspecified: Secondary | ICD-10-CM | POA: Insufficient documentation

## 2020-10-30 DIAGNOSIS — I959 Hypotension, unspecified: Secondary | ICD-10-CM | POA: Diagnosis not present

## 2020-10-30 DIAGNOSIS — G4700 Insomnia, unspecified: Secondary | ICD-10-CM | POA: Insufficient documentation

## 2020-10-30 DIAGNOSIS — Z881 Allergy status to other antibiotic agents status: Secondary | ICD-10-CM

## 2020-10-30 DIAGNOSIS — M793 Panniculitis, unspecified: Secondary | ICD-10-CM | POA: Diagnosis present

## 2020-10-30 DIAGNOSIS — F331 Major depressive disorder, recurrent, moderate: Secondary | ICD-10-CM | POA: Diagnosis present

## 2020-10-30 DIAGNOSIS — J449 Chronic obstructive pulmonary disease, unspecified: Secondary | ICD-10-CM | POA: Diagnosis present

## 2020-10-30 DIAGNOSIS — F411 Generalized anxiety disorder: Secondary | ICD-10-CM | POA: Diagnosis present

## 2020-10-30 DIAGNOSIS — Z1624 Resistance to multiple antibiotics: Secondary | ICD-10-CM | POA: Diagnosis not present

## 2020-10-30 DIAGNOSIS — F418 Other specified anxiety disorders: Secondary | ICD-10-CM | POA: Diagnosis present

## 2020-10-30 DIAGNOSIS — Z6841 Body Mass Index (BMI) 40.0 and over, adult: Secondary | ICD-10-CM

## 2020-10-30 DIAGNOSIS — N200 Calculus of kidney: Secondary | ICD-10-CM | POA: Diagnosis present

## 2020-10-30 DIAGNOSIS — Z880 Allergy status to penicillin: Secondary | ICD-10-CM

## 2020-10-30 DIAGNOSIS — F4310 Post-traumatic stress disorder, unspecified: Secondary | ICD-10-CM | POA: Diagnosis present

## 2020-10-30 DIAGNOSIS — R45851 Suicidal ideations: Secondary | ICD-10-CM | POA: Diagnosis present

## 2020-10-30 DIAGNOSIS — Z833 Family history of diabetes mellitus: Secondary | ICD-10-CM

## 2020-10-30 DIAGNOSIS — Z5901 Sheltered homelessness: Secondary | ICD-10-CM

## 2020-10-30 DIAGNOSIS — J45909 Unspecified asthma, uncomplicated: Secondary | ICD-10-CM | POA: Diagnosis present

## 2020-10-30 DIAGNOSIS — F3130 Bipolar disorder, current episode depressed, mild or moderate severity, unspecified: Secondary | ICD-10-CM | POA: Diagnosis present

## 2020-10-30 DIAGNOSIS — E1122 Type 2 diabetes mellitus with diabetic chronic kidney disease: Secondary | ICD-10-CM | POA: Diagnosis present

## 2020-10-30 DIAGNOSIS — L700 Acne vulgaris: Secondary | ICD-10-CM | POA: Diagnosis present

## 2020-10-30 DIAGNOSIS — T24312A Burn of third degree of left thigh, initial encounter: Secondary | ICD-10-CM | POA: Diagnosis not present

## 2020-10-30 DIAGNOSIS — R002 Palpitations: Secondary | ICD-10-CM | POA: Diagnosis present

## 2020-10-30 DIAGNOSIS — F329 Major depressive disorder, single episode, unspecified: Secondary | ICD-10-CM | POA: Diagnosis present

## 2020-10-30 DIAGNOSIS — G4733 Obstructive sleep apnea (adult) (pediatric): Secondary | ICD-10-CM | POA: Diagnosis present

## 2020-10-30 DIAGNOSIS — Z751 Person awaiting admission to adequate facility elsewhere: Secondary | ICD-10-CM

## 2020-10-30 DIAGNOSIS — Z830 Family history of human immunodeficiency virus [HIV] disease: Secondary | ICD-10-CM

## 2020-10-30 DIAGNOSIS — R059 Cough, unspecified: Secondary | ICD-10-CM | POA: Insufficient documentation

## 2020-10-30 DIAGNOSIS — D631 Anemia in chronic kidney disease: Secondary | ICD-10-CM | POA: Diagnosis present

## 2020-10-30 DIAGNOSIS — G8929 Other chronic pain: Secondary | ICD-10-CM | POA: Diagnosis present

## 2020-10-30 DIAGNOSIS — T310 Burns involving less than 10% of body surface: Secondary | ICD-10-CM | POA: Diagnosis not present

## 2020-10-30 DIAGNOSIS — F17200 Nicotine dependence, unspecified, uncomplicated: Secondary | ICD-10-CM | POA: Diagnosis present

## 2020-10-30 DIAGNOSIS — Z91030 Bee allergy status: Secondary | ICD-10-CM

## 2020-10-30 DIAGNOSIS — F410 Panic disorder [episodic paroxysmal anxiety] without agoraphobia: Secondary | ICD-10-CM | POA: Diagnosis not present

## 2020-10-30 DIAGNOSIS — Z8371 Family history of colonic polyps: Secondary | ICD-10-CM

## 2020-10-30 DIAGNOSIS — N390 Urinary tract infection, site not specified: Principal | ICD-10-CM | POA: Diagnosis present

## 2020-10-30 DIAGNOSIS — M170 Bilateral primary osteoarthritis of knee: Secondary | ICD-10-CM | POA: Diagnosis present

## 2020-10-30 DIAGNOSIS — R52 Pain, unspecified: Secondary | ICD-10-CM

## 2020-10-30 DIAGNOSIS — Z9114 Patient's other noncompliance with medication regimen: Secondary | ICD-10-CM

## 2020-10-30 DIAGNOSIS — F5105 Insomnia due to other mental disorder: Secondary | ICD-10-CM | POA: Diagnosis present

## 2020-10-30 DIAGNOSIS — Z825 Family history of asthma and other chronic lower respiratory diseases: Secondary | ICD-10-CM

## 2020-10-30 DIAGNOSIS — Z809 Family history of malignant neoplasm, unspecified: Secondary | ICD-10-CM

## 2020-10-30 DIAGNOSIS — Z23 Encounter for immunization: Secondary | ICD-10-CM

## 2020-10-30 DIAGNOSIS — Z79899 Other long term (current) drug therapy: Secondary | ICD-10-CM

## 2020-10-30 DIAGNOSIS — B964 Proteus (mirabilis) (morganii) as the cause of diseases classified elsewhere: Secondary | ICD-10-CM | POA: Diagnosis not present

## 2020-10-30 LAB — BASIC METABOLIC PANEL
Anion gap: 10 (ref 5–15)
BUN: 8 mg/dL (ref 6–20)
CO2: 25 mmol/L (ref 22–32)
Calcium: 9.2 mg/dL (ref 8.9–10.3)
Chloride: 102 mmol/L (ref 98–111)
Creatinine, Ser: 1.42 mg/dL — ABNORMAL HIGH (ref 0.44–1.00)
GFR, Estimated: 47 mL/min — ABNORMAL LOW (ref >60)
Glucose, Bld: 98 mg/dL (ref 70–99)
Potassium: 3.2 mmol/L — ABNORMAL LOW (ref 3.5–5.1)
Sodium: 137 mmol/L (ref 135–145)

## 2020-10-30 LAB — I-STAT BETA HCG BLOOD, ED (MC, WL, AP ONLY): I-stat hCG, quantitative: <5 m[IU]/mL (ref <5)

## 2020-10-30 LAB — CBC
HCT: 42.7 % (ref 36.0–46.0)
Hemoglobin: 12.4 g/dL (ref 12.0–15.0)
MCH: 25.7 pg — ABNORMAL LOW (ref 26.0–34.0)
MCHC: 29 g/dL — ABNORMAL LOW (ref 30.0–36.0)
MCV: 88.4 fL (ref 80.0–100.0)
Platelets: 550 10*3/uL — ABNORMAL HIGH (ref 150–400)
RBC: 4.83 MIL/uL (ref 3.87–5.11)
RDW: 19.6 % — ABNORMAL HIGH (ref 11.5–15.5)
WBC: 12.1 10*3/uL — ABNORMAL HIGH (ref 4.0–10.5)
nRBC: 0 % (ref 0.0–0.2)

## 2020-10-30 LAB — ETHANOL: Alcohol, Ethyl (B): <10 mg/dL (ref <10)

## 2020-10-30 LAB — TROPONIN I (HIGH SENSITIVITY)
Troponin I (High Sensitivity): 6 ng/L (ref <18)
Troponin I (High Sensitivity): 9 ng/L (ref <18)

## 2020-10-30 MED ORDER — ONDANSETRON 4 MG PO TBDP
4.0000 mg | ORAL_TABLET | Freq: Once | ORAL | Status: AC
  Administered 2020-10-30: 4 mg via ORAL
  Filled 2020-10-30: qty 1

## 2020-10-30 NOTE — ED Notes (Signed)
Patient called for vitals recheck x1 with no response

## 2020-10-30 NOTE — ED Provider Notes (Signed)
Behavioral Health Urgent Care Medical Screening Exam  Patient Name: Heather [name] MRN: [id number] Date of Evaluation: 10/30/20 Chief Complaint:   Diagnosis:  Final diagnoses:  Severe episode of recurrent major depressive disorder, without psychotic features ([place])  Homelessness    History of Present illness: Heather [name] is a 45 y.o. female.  with past history of MDD, PTSD, anxiety, [facility] accompanied by [facility] for suicidal thoughts.  Patient states she has been feeling depressed for a long time.  She has been having suicidal thoughts for 2 days with a plan of overdosing with her sleeping pills.  Patient states she is currently homeless and her twin kids are in foster care right now.  Patient endorses frequent crying episodes, depressed mood, insomnia, poor appetite, anhedonia, fatigue, low energy, hopelessness, helplessness, worthlessness, poor concentration and memory.  Patient endorses racing thoughts and generalized anxiety.  Patient denies HI, AVH.  Patient reports past suicidal attempt 8 years ago by cutting.  Patient had been on multiple medication including Wellbutrin, BuSpar, Cymbalta, gabapentin, Requip, trazodone.  Patient states that during her last hospitalization they told her to stop all medication.  She is not sure why her medications were stopped. Patient was recently hospitalized to [facility] from 7/19-7/21 initially due to urinary symptoms but her hospitalization got extended due to psychiatric reasons.  Patient was psychiatrically cleared and was discharged with resources for shelters and substance abuse programs. Patient complains of cough and chest pain to the nurse while waiting in the room.  Talked to Dr Francia Greaves at [facility], ED who has agreed to accept the patient.  [facility] transferred to [facility] for further management.  Psychiatric Specialty Exam  Presentation  General Appearance:Casual  Eye Contact:Good  Speech:Clear and  Coherent  Speech Volume:Normal  Handedness:Right   Mood and Affect  Mood:Depressed  Affect:Congruent; Depressed   Thought Process  Thought Processes:Coherent  Descriptions of Associations:Intact  Orientation:Full (Time, Place and Person)  Thought Content:WDL  Diagnosis of Schizophrenia or Schizoaffective disorder in past: No   Hallucinations:None  Ideas of Reference:None  Suicidal Thoughts:Yes, Active With Intent; With Plan With Intent; With Plan  Homicidal Thoughts:No   Sensorium  Memory:Immediate Good; Recent Good; Remote Good  Judgment:Fair  Insight:Fair   Executive Functions  Concentration:Fair  Attention Span:Fair  [place]   Psychomotor Activity  Psychomotor Activity:Normal   Assets  Assets:Desire for Improvement; Resilience; Communication Skills   Sleep  Sleep:Poor  Number of hours:  No data recorded  No data recorded  Physical Exam: Physical Exam Vitals and nursing note reviewed.  Constitutional:      General: She is in acute distress.     Appearance: She is obese. She is not ill-appearing, toxic-appearing or diaphoretic.  HENT:     Head: Normocephalic and atraumatic.  Pulmonary:     Effort: Pulmonary effort is normal.  Neurological:     General: No focal deficit present.     Mental Status: She is alert and oriented to person, place, and time.   Review of Systems  Constitutional:  Negative for chills and fever.  HENT:  Negative for hearing loss.   Eyes:  Negative for blurred vision.  Cardiovascular:  Negative for chest pain.  Gastrointestinal:  Negative for nausea and vomiting.  Skin:  Negative for rash.  Psychiatric/Behavioral:  Positive for depression, substance abuse and suicidal ideas. Negative for hallucinations. The patient is nervous/anxious and has insomnia.   Blood pressure 111/61, pulse (!) 105,  temperature 99.4 F (37.4 C), temperature source Oral, resp. rate 20,  last menstrual period 10/01/2020, SpO2 97 %. There is no height or weight on file to calculate BMI.  Musculoskeletal: Strength & Muscle Tone: within normal limits Gait & Station: normal Patient leans: N/A   [facility] MSE Discharge Disposition for Follow up and Recommendations: Based on my evaluation the patient appears to have an emergency medical condition for which I recommend the patient be transferred to the emergency department for further evaluation.  Patient complains of cough and chest pain to the nurse while waiting in the room.  Talked to Dr Francia Greaves at [facility], ED who has agreed to accept the patient.  [facility] transferred to [facility] for further medical management.  TTS psychiatry consult can be placed after patient is medically cleared for disposition.  Armando Reichert, MD 10/30/2020, 1:22 PM

## 2020-10-30 NOTE — ED Notes (Signed)
EMS called and has arrived to transport patient.  Attempted to call report to [facility] ED multiple times.  Spoke with registration and they stated that Charge Nurse is very busy and there are no other nurses available to take report.  Per ED staff, nursing encouraged to continue to call Charge Nurse.  Number confirmed but unable to reach prior to transport.

## 2020-10-30 NOTE — ED Provider Notes (Addendum)
Emergency Medicine Provider Triage Evaluation Note  Heather [name] , a 45 y.o. female  was evaluated in triage.  Pt complains of multiple complaints.  Patient states that she originally went to [facility] this morning for suicidal thoughts, does not have a plan.  Patient states that she then started having chest pain, congestion, back pain, abdominal pain, nausea and reflux symptoms.  Patient states that chest pain is in the center of her chest.  Describes it as a flutter, states that she is currently having the flutter feeling.  Review of Systems  Positive: Chest pain, nausea, reflux, abdominal pain, congestion, SI Negative: HI, substance use  Physical Exam  LMP 10/01/2020  Gen:   Awake, no distress   Resp:  Normal effort  MSK:   Moves extremities without difficulty  Other:  SI  Medical Decision Making  Medically screening exam initiated at 2:14 PM.  Appropriate orders placed.  Heather [name] was informed that the remainder of the evaluation will be completed by another provider, this initial triage assessment does not replace that evaluation, and the importance of remaining in the ED until their evaluation is complete.     Alfredia Client, PA-C 10/30/20 1416   Addendum: Was notified by Dr. Louie Bun from [facility] about patient's urine culture that resulted there.  Urine was obtained 3 days ago, appears as if it is resistant to multiple oral antibiotics, patient will need to be admitted for IV antibiotics at this time.     Alfredia Client, PA-C 10/30/20 2103    Daleen Bo, MD 10/31/20 1739

## 2020-10-30 NOTE — Progress Notes (Signed)
Virtual Visit Consent   Heather [name], you are scheduled for a virtual visit with a [facility] provider today.     Just as with appointments in the office, your consent must be obtained to participate.  Your consent will be active for this visit and any virtual visit you may have with one of our providers in the next 365 days.     If you have a MyChart account, a copy of this consent can be sent to you electronically.  All virtual visits are billed to your insurance company just like a traditional visit in the office.    As this is a virtual visit, video technology does not allow for your provider to perform a traditional examination.  This may limit your provider's ability to fully assess your condition.  If your provider identifies any concerns that need to be evaluated in person or the need to arrange testing (such as labs, EKG, etc.), we will make arrangements to do so.     Although advances in technology are sophisticated, we cannot ensure that it will always work on either your end or our end.  If the connection with a video visit is poor, the visit may have to be switched to a telephone visit.  With either a video or telephone visit, we are not always able to ensure that we have a secure connection.     I need to obtain your verbal consent now.   Are you willing to proceed with your visit today?    Heather [name] has provided verbal consent on 10/30/2020 for a virtual visit (video or telephone).   Apolonio Schneiders, FNP   Date: 10/30/2020 10:06 AM   Virtual Visit via Video Note   I, Apolonio Schneiders, connected with  Heather [name]  ([id number], March 05, 1976) on 10/30/20 at 10:15 AM EDT by a video-enabled telemedicine application and verified that I am speaking with the correct person using two identifiers.  Location: Patient: Virtual Visit Location Patient: Home Provider: Virtual Visit Location Provider: Home Office   I discussed the limitations of evaluation and management by  telemedicine and the availability of in person appointments. The patient expressed understanding and agreed to proceed.    History of Present Illness: Heather [name] is a 45 y.o. who identifies as a female who was assigned female at birth, and is being seen today with symptoms she feels are consistent with COVID-19.   She was in the ED on 10/24/20 through 10/26/20. Initially presented to ED with urinary symptoms, stay was extended due to psychiatric needs. Patient states today that she was told to stop all her medications at the time of discharge due to "inflammation"   For the past 4 days she has not taken her prescribed medications.   Her complaints today are nasal congestion and a productive cough. She is now concerned that she has COVID.  She has had one COVID vaccine so far. She does not have a way to take a home COVID test at this time.  She is homeless without transportation  She has a complex medical background - see PMH below   Her PCP is Dr. Lucianne Lei     HPI:  +hot/chilled +productive cough  +nasal congestion  Problems:  Patient Active Problem List   Diagnosis Date Noted   Adjustment disorder with mixed anxiety and depressed mood 10/27/2020   Major depression, chronic 09/13/2020   Insomnia    Anemia of chronic disease    Hyponatremia  AKI (acute kidney injury) ([place])    Chronic bilateral low back pain without sciatica    Debility 08/24/2020   SIRS (systemic inflammatory response syndrome) ([place]) 08/18/2020   Pressure injury of skin 08/11/2020   Rhabdomyolysis 08/10/2020   Acute renal failure ([place]) 08/10/2020   PTSD (post-traumatic stress disorder) 05/15/2020   Major depressive disorder, recurrent episode, moderate ([place]) 05/05/2020   Generalized anxiety disorder 05/05/2020   Blood in stool    Gastritis and gastroduodenitis    Benign neoplasm of sigmoid colon    Difficult intubation 08/10/2018   Class 3 severe obesity with serious comorbidity and body mass  index (BMI) greater than or equal to 70 in adult ([place]) 06/16/2018   Nexplanon insertion 03/27/2017   Postpartum hypertension 03/05/2017   Status post primary low transverse cesarean section 02/03/2017   H/O pre-eclampsia 01/27/2017   Gestational htn w/o significant proteinuria, third trimester 01/20/2017   Mild persistent asthma without complication [id number]   Perennial allergic rhinitis 11/05/2016   Mild persistent asthma with acute exacerbation 11/05/2016   Excess weight gain in pregnancy, second trimester 10/30/2016   Hard to intubate 11/07/2015   Hypoglycemia 11/07/2015   OSA (obstructive sleep apnea) 11/07/2015   DM type 2 (diabetes mellitus, type 2) ([place]) 12/07/2014   Panniculitis 12/06/2014   Cellulitis, abdominal wall 11/11/2014   Abdominal pain 07/14/2014   Loose stools 07/14/2014   Melena [id number]   Eosinophilic esophagitis [id number]   Change in bowel habits 04/28/2013   Esophageal dysphagia 04/28/2013   Insomnia due to mental disorder(327.02) 08/08/2011   RLS (restless legs syndrome) 08/08/2011   PALPITATIONS, OCCASIONAL 11/01/2009   DISORDER, TOBACCO USE 08/25/2008   Leucocytosis 07/28/2008   ALLERGIC RHINITIS, SEASONAL [id number]   DYSMETABOLIC SYNDROME [id number]   Morbid obesity ([place]) 05/13/2006   EXTERNAL HEMORRHOIDS 05/13/2006   HYPERLIPIDEMIA 05/12/2006   Essential hypertension 05/12/2006   ASTHMA 05/12/2006   GERD (gastroesophageal reflux disease) 05/12/2006   OSTEOARTHRITIS 05/12/2006    Allergies:  Allergies  Allergen Reactions   Amoxicillin Anaphylaxis   Bee Venom Shortness Of Breath   Penicillins Anaphylaxis    Has patient had a PCN reaction causing immediate rash, facial/tongue/throat swelling, SOB or lightheadedness with hypotension: No Has patient had a PCN reaction causing severe rash involving mucus membranes or skin necrosis: No Has patient had a PCN reaction that required hospitalization No Has patient had a PCN reaction occurring  within the last 10 years: No If all of the above answers are "NO", then may proceed with Cephalosporin use.'  REACTION: Angioedema   Penicillin G    Adhesive [Tape] Rash   Latex Rash   Vancomycin Other (See Comments)    Pt can tolerate Vancomycin but did cause Vancomycin Infusion Reaction.  Recommend to pre-medicate with Benadryl before doses administered.     Medications:  Current Outpatient Medications:    acetaminophen (TYLENOL) 325 MG tablet, Take 650 mg by mouth every 6 (six) hours as needed for mild pain, fever or headache., Disp: , Rfl:    albuterol (VENTOLIN HFA) 108 (90 Base) MCG/ACT inhaler, INHALE 2 PUFFS INTO THE LUNGS EVERY 4 HOURS AS NEEDED FOR WHEEZING OR SHORTNESS OF BREATH (Patient taking differently: Inhale 2 puffs into the lungs every 4 (four) hours as needed for wheezing or shortness of breath.), Disp: 6.7 g, Rfl: 1   ascorbic acid (VITAMIN C) 500 MG tablet, Take 1 tablet (500 mg total) by mouth daily., Disp: 30 tablet, Rfl: 0   buPROPion (WELLBUTRIN SR) 100 MG 12 hr tablet,  Take 2 tablets (200 mg total) by mouth 2 (two) times daily., Disp: 60 tablet, Rfl: 2   busPIRone (BUSPAR) 30 MG tablet, Take 0.5 tablets (15 mg total) by mouth daily., Disp: 60 tablet, Rfl: 2   Cyanocobalamin (VITAMIN B-12) 5000 MCG TBDP, Take 5,000 mcg by mouth daily., Disp: , Rfl:    diclofenac Sodium (VOLTAREN) 1 % GEL, Apply 2 g topically 4 (four) times daily., Disp: 100 g, Rfl: 0   DULoxetine (CYMBALTA) 20 MG capsule, Take 1 capsule (20 mg total) by mouth daily. (Patient not taking: No sig reported), Disp: 30 capsule, Rfl: 0   fluticasone (FLONASE) 50 MCG/ACT nasal spray, Place 2 sprays into both nostrils 2 (two) times daily., Disp: , Rfl:    fluticasone (FLOVENT HFA) 110 MCG/ACT inhaler, Inhale 2 puffs into the lungs 2 (two) times daily., Disp: 1 Inhaler, Rfl: 5   folic acid (FOLVITE) 1 MG tablet, Take 1 tablet (1 mg total) by mouth daily. (Patient not taking: No sig reported), Disp: 30 tablet,  Rfl: 2   gabapentin (NEURONTIN) 100 MG capsule, Take 1 capsule (100 mg total) by mouth 3 (three) times daily., Disp: 90 capsule, Rfl: 2   methocarbamol (ROBAXIN) 500 MG tablet, Take 1 tablet (500 mg total) by mouth 2 (two) times daily as needed for muscle spasms., Disp: 10 tablet, Rfl: 0   montelukast (SINGULAIR) 10 MG tablet, Take 1 tablet (10 mg total) by mouth at bedtime., Disp: 30 tablet, Rfl: 5   Multiple Vitamin (MULTIVITAMIN WITH MINERALS) TABS tablet, Take 1 tablet by mouth daily. (Patient not taking: No sig reported), Disp: , Rfl:    rOPINIRole (REQUIP) 3 MG tablet, TAKE 1 TABLET(3 MG) BY MOUTH AT BEDTIME (Patient taking differently: Take 3 mg by mouth at bedtime.), Disp: 90 tablet, Rfl: 0   rOPINIRole (REQUIP) 4 MG tablet, Take 4 mg by mouth at bedtime., Disp: , Rfl:    senna-docusate (SENOKOT-S) 8.6-50 MG tablet, Take 2 tablets by mouth daily., Disp: 60 tablet, Rfl: 0   silver sulfADIAZINE (SILVADENE) 1 % cream, Apply topically 2 (two) times daily. (Patient taking differently: Apply 1 application topically 2 (two) times daily.), Disp: 400 g, Rfl: 0   tamsulosin (FLOMAX) 0.4 MG CAPS capsule, Take 1 capsule (0.4 mg total) by mouth daily after supper., Disp: 30 capsule, Rfl: 0   traMADol (ULTRAM) 50 MG tablet, Take 1 tablet (50 mg total) by mouth every 6 (six) hours as needed for severe pain., Disp: 20 tablet, Rfl: 0   traZODone (DESYREL) 50 MG tablet, Take 0.5 tablets (25 mg total) by mouth at bedtime., Disp: 30 tablet, Rfl: 2   Zinc Sulfate 220 (50 Zn) MG TABS, Take 1 tablet (220 mg total) by mouth daily., Disp: 30 tablet, Rfl: 0 No current facility-administered medications for this visit. (Patient states she is not taking any of above medications)  Observations/Objective:  Resting comfortably at a hotel   Head is normocephalic, atraumatic.  No labored breathing.  Speech is clear and coherent with logical content.  Patient is alert and oriented at baseline.    Assessment and  Plan: 1. Viral upper respiratory tract infection Advised patient that she should be tested for COVID-19. Due to financial situation patient is unable to take home test.   States she should be able to get a ride to UC later this afternoon.   Advised UC visit if not ED for COVID testing and management due to underlying comorbidities and unvaccinated status-patient should be seen today and she understands  the plan.     Patient is agreeable to plan-will make arrangements for transportation and call 911 if needed.   Also encouraged follow up with PCP and BH to discuss discharge plans from recent ED visit.   Follow Up Instructions: I discussed the assessment and treatment plan with the patient. The patient was provided an opportunity to ask questions and all were answered. The patient agreed with the plan and demonstrated an understanding of the instructions.  A copy of instructions were sent to the patient via MyChart.  The patient was advised to call back or seek an in-person evaluation if the symptoms worsen or if the condition fails to improve as anticipated.  Time:  I spent 15 minutes with the patient via telehealth technology discussing the above problems/concerns.    Apolonio Schneiders, FNP

## 2020-10-30 NOTE — ED Notes (Signed)
Patient complaining of upper mid chest pain which radiates to her epigastric region.  Stated that she thinks it might be mucous as she spit some out earlier.  Patient conversant.  Reported having a UTI also and being hospitalized yesterday at [facility]".  Stated that an EKG was done, "but nobody said anything.".  MD notified.

## 2020-10-30 NOTE — Discharge Instructions (Addendum)
Pt is transferred to [facility] ED due to cough and chest pain. Hand off given to Dr. Francia Greaves.

## 2020-10-30 NOTE — BH Assessment (Signed)
Pt to [facility] voluntarily reporting SI with a plan to overdose on sleeping pills. Reports SI for the past two days. Reports she has not taken her medications in about a week. Pt reports triggers to depression is trying to get her kids back. Pt also reports being homeless for the past 2 months. Pt told [employer] that she did not have any money for a hotel room.   Pt is emergent.
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
## 2020-10-30 NOTE — ED Triage Notes (Signed)
Patient arrived from [facility] for cp that has now resolved. Patient reports that she went to [facility] for SI thoughts, no plan. Patient denies CP but complains of reflux symptoms. Patient received '324mg'$  aspirin pta. Patient has been seen at other EDs recently for various complaints

## 2020-10-31 ENCOUNTER — Emergency Department (HOSPITAL_COMMUNITY): Payer: Medicaid Other

## 2020-10-31 DIAGNOSIS — D75839 Thrombocytosis, unspecified: Secondary | ICD-10-CM | POA: Diagnosis present

## 2020-10-31 DIAGNOSIS — E876 Hypokalemia: Secondary | ICD-10-CM | POA: Diagnosis present

## 2020-10-31 DIAGNOSIS — N183 Chronic kidney disease, stage 3 unspecified: Secondary | ICD-10-CM

## 2020-10-31 DIAGNOSIS — N390 Urinary tract infection, site not specified: Secondary | ICD-10-CM | POA: Diagnosis present

## 2020-10-31 LAB — HEPATIC FUNCTION PANEL
ALT: 23 U/L (ref 0–44)
AST: 30 U/L (ref 15–41)
Albumin: 3.1 g/dL — ABNORMAL LOW (ref 3.5–5.0)
Alkaline Phosphatase: 53 U/L (ref 38–126)
Bilirubin, Direct: 0.2 mg/dL (ref 0.0–0.2)
Indirect Bilirubin: 0.2 mg/dL — ABNORMAL LOW (ref 0.3–0.9)
Total Bilirubin: 0.4 mg/dL (ref 0.3–1.2)
Total Protein: 7.3 g/dL (ref 6.5–8.1)

## 2020-10-31 LAB — URINALYSIS, ROUTINE W REFLEX MICROSCOPIC
Bilirubin Urine: NEGATIVE
Glucose, UA: NEGATIVE mg/dL
Ketones, ur: NEGATIVE mg/dL
Nitrite: POSITIVE — AB
Protein, ur: 30 mg/dL — AB
Specific Gravity, Urine: 1.028 (ref 1.005–1.030)
WBC, UA: >50 WBC/hpf — ABNORMAL HIGH (ref 0–5)
pH: 6 (ref 5.0–8.0)

## 2020-10-31 LAB — RAPID URINE DRUG SCREEN, HOSP PERFORMED
Amphetamines: NOT DETECTED
Barbiturates: NOT DETECTED
Benzodiazepines: NOT DETECTED
Cocaine: NOT DETECTED
Opiates: NOT DETECTED
Tetrahydrocannabinol: NOT DETECTED

## 2020-10-31 LAB — RESP PANEL BY RT-PCR (FLU A&B, COVID) ARPGX2
Influenza A by PCR: NEGATIVE
Influenza B by PCR: NEGATIVE
SARS Coronavirus 2 by RT PCR: NEGATIVE

## 2020-10-31 LAB — MAGNESIUM: Magnesium: 2.1 mg/dL (ref 1.7–2.4)

## 2020-10-31 MED ORDER — ASCORBIC ACID 500 MG PO TABS
500.0000 mg | ORAL_TABLET | Freq: Every day | ORAL | Status: DC
  Administered 2020-10-31 – 2021-01-05 (×67): 500 mg via ORAL
  Filled 2020-10-31 (×68): qty 1

## 2020-10-31 MED ORDER — ROPINIROLE HCL 1 MG PO TABS
4.0000 mg | ORAL_TABLET | Freq: Every day | ORAL | Status: DC
  Administered 2020-11-01 – 2021-01-04 (×66): 4 mg via ORAL
  Filled 2020-10-31 (×66): qty 4

## 2020-10-31 MED ORDER — SODIUM CHLORIDE 0.9% FLUSH
3.0000 mL | Freq: Two times a day (BID) | INTRAVENOUS | Status: DC
  Administered 2020-11-01 – 2020-12-24 (×100): 3 mL via INTRAVENOUS

## 2020-10-31 MED ORDER — BUSPIRONE HCL 5 MG PO TABS
15.0000 mg | ORAL_TABLET | Freq: Every day | ORAL | Status: DC
  Administered 2020-10-31 – 2020-11-01 (×2): 15 mg via ORAL
  Filled 2020-10-31: qty 3
  Filled 2020-10-31: qty 2

## 2020-10-31 MED ORDER — SODIUM CHLORIDE 0.9 % IV SOLN
2.0000 g | Freq: Two times a day (BID) | INTRAVENOUS | Status: AC
  Administered 2020-11-01 – 2020-11-03 (×6): 2 g via INTRAVENOUS
  Filled 2020-10-31 (×6): qty 2

## 2020-10-31 MED ORDER — ENOXAPARIN SODIUM 40 MG/0.4ML IJ SOSY: 40.0000 mg | PREFILLED_SYRINGE | INTRAMUSCULAR | Status: DC

## 2020-10-31 MED ORDER — BUPROPION HCL ER (SR) 100 MG PO TB12
200.0000 mg | ORAL_TABLET | Freq: Two times a day (BID) | ORAL | Status: AC
  Administered 2020-10-31 – 2021-01-02 (×127): 200 mg via ORAL
  Filled 2020-10-31 (×130): qty 2

## 2020-10-31 MED ORDER — ONDANSETRON HCL 4 MG/2ML IJ SOLN
4.0000 mg | Freq: Four times a day (QID) | INTRAMUSCULAR | Status: DC | PRN
  Administered 2020-11-03: 4 mg via INTRAVENOUS
  Filled 2020-10-31: qty 2

## 2020-10-31 MED ORDER — CLOTRIMAZOLE 1 % EX CREA
TOPICAL_CREAM | Freq: Two times a day (BID) | CUTANEOUS | Status: DC
  Filled 2020-10-31 (×6): qty 15

## 2020-10-31 MED ORDER — IOHEXOL 300 MG/ML  SOLN
100.0000 mL | Freq: Once | INTRAMUSCULAR | Status: AC | PRN
  Administered 2020-10-31: 100 mL via INTRAVENOUS

## 2020-10-31 MED ORDER — MONTELUKAST SODIUM 10 MG PO TABS
10.0000 mg | ORAL_TABLET | Freq: Every day | ORAL | Status: DC
  Administered 2020-10-31 – 2021-01-04 (×66): 10 mg via ORAL
  Filled 2020-10-31 (×67): qty 1

## 2020-10-31 MED ORDER — ONDANSETRON HCL 4 MG PO TABS
4.0000 mg | ORAL_TABLET | Freq: Four times a day (QID) | ORAL | Status: DC | PRN
  Administered 2020-11-06 – 2020-12-26 (×4): 4 mg via ORAL
  Filled 2020-10-31 (×4): qty 1

## 2020-10-31 MED ORDER — FLUTICASONE PROPIONATE 50 MCG/ACT NA SUSP
2.0000 | Freq: Two times a day (BID) | NASAL | Status: DC
  Administered 2020-10-31 – 2021-01-05 (×118): 2 via NASAL
  Filled 2020-10-31 (×2): qty 16

## 2020-10-31 MED ORDER — TRAMADOL HCL 50 MG PO TABS
50.0000 mg | ORAL_TABLET | Freq: Three times a day (TID) | ORAL | Status: DC | PRN
  Administered 2020-11-02 – 2020-12-08 (×20): 50 mg via ORAL
  Filled 2020-10-31 (×20): qty 1

## 2020-10-31 MED ORDER — FLUTICASONE PROPIONATE HFA 110 MCG/ACT IN AERO: 2.0000 | INHALATION_SPRAY | Freq: Two times a day (BID) | RESPIRATORY_TRACT | Status: DC

## 2020-10-31 MED ORDER — SENNOSIDES-DOCUSATE SODIUM 8.6-50 MG PO TABS
2.0000 | ORAL_TABLET | Freq: Every day | ORAL | Status: DC
  Administered 2020-10-31 – 2021-01-05 (×60): 2 via ORAL
  Filled 2020-10-31 (×67): qty 2

## 2020-10-31 MED ORDER — ALBUTEROL SULFATE HFA 108 (90 BASE) MCG/ACT IN AERS
2.0000 | INHALATION_SPRAY | RESPIRATORY_TRACT | Status: DC | PRN
  Administered 2020-11-02 – 2020-11-11 (×4): 2 via RESPIRATORY_TRACT
  Filled 2020-10-31: qty 6.7

## 2020-10-31 MED ORDER — ONDANSETRON HCL 4 MG/2ML IJ SOLN
4.0000 mg | Freq: Once | INTRAMUSCULAR | Status: AC
  Administered 2020-10-31: 4 mg via INTRAVENOUS
  Filled 2020-10-31: qty 2

## 2020-10-31 MED ORDER — POTASSIUM CHLORIDE CRYS ER 20 MEQ PO TBCR
20.0000 meq | EXTENDED_RELEASE_TABLET | Freq: Once | ORAL | Status: AC
  Administered 2020-10-31: 20 meq via ORAL
  Filled 2020-10-31: qty 1

## 2020-10-31 MED ORDER — ACETAMINOPHEN 650 MG RE SUPP: 650.0000 mg | Freq: Four times a day (QID) | RECTAL | Status: DC | PRN

## 2020-10-31 MED ORDER — GABAPENTIN 100 MG PO CAPS
100.0000 mg | ORAL_CAPSULE | Freq: Three times a day (TID) | ORAL | Status: DC
  Administered 2020-10-31 – 2020-11-01 (×2): 100 mg via ORAL
  Filled 2020-10-31 (×2): qty 1

## 2020-10-31 MED ORDER — BUDESONIDE 0.25 MG/2ML IN SUSP
0.2500 mg | Freq: Two times a day (BID) | RESPIRATORY_TRACT | Status: DC
  Administered 2020-11-01 – 2020-11-04 (×7): 0.25 mg via RESPIRATORY_TRACT
  Filled 2020-10-31 (×7): qty 2

## 2020-10-31 MED ORDER — NYSTATIN 100000 UNIT/GM EX POWD
Freq: Two times a day (BID) | CUTANEOUS | Status: DC
  Administered 2020-11-02: 1 via TOPICAL
  Filled 2020-10-31 (×4): qty 15

## 2020-10-31 MED ORDER — SENNOSIDES-DOCUSATE SODIUM 8.6-50 MG PO TABS: 1.0000 | ORAL_TABLET | Freq: Every evening | ORAL | Status: DC | PRN

## 2020-10-31 MED ORDER — SODIUM CHLORIDE 0.9% FLUSH
3.0000 mL | INTRAVENOUS | Status: DC | PRN
  Administered 2020-11-29: 3 mL via INTRAVENOUS

## 2020-10-31 MED ORDER — SODIUM CHLORIDE 0.9 % IV SOLN: 2.0000 g | Freq: Three times a day (TID) | INTRAVENOUS | Status: DC

## 2020-10-31 MED ORDER — SODIUM CHLORIDE 0.9 % IV SOLN
1.0000 g | INTRAVENOUS | Status: DC
  Administered 2020-10-31: 1000 mg via INTRAVENOUS
  Filled 2020-10-31: qty 1

## 2020-10-31 MED ORDER — SODIUM CHLORIDE 0.9 % IV BOLUS
500.0000 mL | Freq: Once | INTRAVENOUS | Status: AC
  Administered 2020-10-31: 500 mL via INTRAVENOUS

## 2020-10-31 MED ORDER — SODIUM CHLORIDE 0.9 % IV SOLN: 250.0000 mL | INTRAVENOUS | Status: DC | PRN

## 2020-10-31 MED ORDER — ENOXAPARIN SODIUM 100 MG/ML IJ SOSY
100.0000 mg | PREFILLED_SYRINGE | INTRAMUSCULAR | Status: DC
  Administered 2020-10-31 – 2021-01-05 (×67): 100 mg via SUBCUTANEOUS
  Filled 2020-10-31 (×67): qty 1

## 2020-10-31 MED ORDER — ACETAMINOPHEN 325 MG PO TABS
650.0000 mg | ORAL_TABLET | Freq: Four times a day (QID) | ORAL | Status: DC | PRN
  Administered 2020-11-03 – 2021-01-02 (×13): 650 mg via ORAL
  Filled 2020-10-31 (×13): qty 2

## 2020-10-31 MED ORDER — FENTANYL CITRATE (PF) 100 MCG/2ML IJ SOLN
50.0000 ug | Freq: Once | INTRAMUSCULAR | Status: AC
  Administered 2020-10-31: 50 ug via INTRAVENOUS
  Filled 2020-10-31: qty 2

## 2020-10-31 NOTE — ED Notes (Signed)
ED PA at [facility] 

## 2020-10-31 NOTE — ED Notes (Signed)
Attempted IV x4, including [place], unsuccessful, IV team consulted. Pending response.

## 2020-10-31 NOTE — ED Notes (Signed)
Report attempted x 1

## 2020-10-31 NOTE — ED Provider Notes (Signed)
[facility] EMERGENCY DEPARTMENT Provider Note   CSN: [id number] Arrival date & time: 10/30/20  1409     History Chief Complaint  Patient presents with   BH/CP    Heather [name] is a 45 y.o. female with a past medical history of obesity, spinal stenosis, schizoaffective disorder.  The patient has multiple complaints however chiefly she came to the emergency department because while at the behavioral health urgent care last night she had an episode of feeling like her throat was closing followed by nausea and vomiting of bright green emesis.  She states that this has been happening almost daily.  She denies other symptoms of anaphylaxis.  The patient also states that she was seen at [facility] a few days ago because she has been having difficulty urinating and was called by them because her urine culture was positive for infection that was resistant to multiple antibiotics and that she needs to be treated with IV antibiotics.  I reviewed the patient's EMR and urine culture which shows greater than 100,000 of Enterobacter cloacae resistant to most antibiotics.  Patient also complains of abdominal pain which has been progressively worsening throughout the course of her overnight stay in the waiting room.  She has chronic back pain due to her foraminal stenosis and has complaints of pain in that area.  She also states that she was taken off of all of her chronic psychiatric medications and that is the reason she went to the behavioral health urgent care because she has been feeling more depressed and had some mild passive SI thoughts and wanted to get restarted on her regular medications.  She denies fever or chills.  She is status post cholecystectomy.  She denies chest pain or shortness of breath.  HPI     Past Medical History:  Diagnosis Date   Allergy    Amenorrhea    Anemia    post partum    Anxiety    Anxiety    Arthritis    Asthma    Back pain     Constipation    COPD (chronic obstructive pulmonary disease) ([place])    Delivery with history of C-section    Depression    Depression    Diabetes mellitus without complication ([place])    patient denies but states she has hyperglycemia-diet controlled   Dysmenorrhea    Dysrhythmia    DR Johnsie Cancel     Ectopic pregnancy 2013   Edema, lower extremity    Eosinophilic esophagitis    Diagnosed at [facility] 06/16/2013, untreated   Gallbladder problem    GERD (gastroesophageal reflux disease)    HEARTBURN   TUMS   Hard to intubate 11/07/2015   High cholesterol    IBS (irritable bowel syndrome)    Leukocytosis 07/28/2008   Qualifier: Diagnosis of  By: Jonna Munro MD, Cornelius     Morbid obesity [facility])    Neuromuscular disorder ([place])    RESTLESS LEG    Obesity    Schizoaffective disorder, bipolar type ([place])    Sepsis ([place]) 11/11/2014   Shortness of breath    WITH EXERTION    Sleep apnea    CPAP- in process of restarting     Patient Active Problem List   Diagnosis Date Noted   Adjustment disorder with mixed anxiety and depressed mood 10/27/2020   Major depression, chronic 09/13/2020   Insomnia    Anemia of chronic disease    Hyponatremia    AKI (acute kidney injury) (  [place])    Chronic bilateral low back pain without sciatica    Debility 08/24/2020   SIRS (systemic inflammatory response syndrome) ([place]) 08/18/2020   Pressure injury of skin 08/11/2020   Rhabdomyolysis 08/10/2020   Acute renal failure ([place]) 08/10/2020   PTSD (post-traumatic stress disorder) 05/15/2020   Major depressive disorder, recurrent episode, moderate ([place]) 05/05/2020   Generalized anxiety disorder 05/05/2020   Blood in stool    Gastritis and gastroduodenitis    Benign neoplasm of sigmoid colon    Difficult intubation 08/10/2018   Class 3 severe obesity with serious comorbidity and body mass index (BMI) greater than or equal to 70 in adult [facility]) 06/16/2018   Nexplanon insertion 03/27/2017   Postpartum hypertension  03/05/2017   Status post primary low transverse cesarean section 02/03/2017   H/O pre-eclampsia 01/27/2017   Gestational htn w/o significant proteinuria, third trimester 01/20/2017   Mild persistent asthma without complication [id number]   Perennial allergic rhinitis 11/05/2016   Mild persistent asthma with acute exacerbation 11/05/2016   Excess weight gain in pregnancy, second trimester 10/30/2016   Hard to intubate 11/07/2015   Hypoglycemia 11/07/2015   OSA (obstructive sleep apnea) 11/07/2015   DM type 2 (diabetes mellitus, type 2) ([place]) 12/07/2014   Panniculitis 12/06/2014   Cellulitis, abdominal wall 11/11/2014   Abdominal pain 07/14/2014   Loose stools 07/14/2014   Melena [id number]   Eosinophilic esophagitis [id number]   Change in bowel habits 04/28/2013   Esophageal dysphagia 04/28/2013   Insomnia due to mental disorder(327.02) 08/08/2011   RLS (restless legs syndrome) 08/08/2011   PALPITATIONS, OCCASIONAL 11/01/2009   DISORDER, TOBACCO USE 08/25/2008   Leucocytosis 07/28/2008   ALLERGIC RHINITIS, SEASONAL [id number]   DYSMETABOLIC SYNDROME [id number]   Morbid obesity ([place]) 05/13/2006   EXTERNAL HEMORRHOIDS 05/13/2006   HYPERLIPIDEMIA 05/12/2006   Essential hypertension 05/12/2006   ASTHMA 05/12/2006   GERD (gastroesophageal reflux disease) 05/12/2006   OSTEOARTHRITIS 05/12/2006    Past Surgical History:  Procedure Laterality Date   BIOPSY  08/13/2018   Procedure: BIOPSY;  Surgeon: Yetta Flock, MD;  Location: [facility];  Service: Gastroenterology;;   CESAREAN SECTION MULTI-GESTATIONAL N/A 02/03/2017   Procedure: CESAREAN SECTION MULTI-GESTATIONAL;  Surgeon: Jonnie Kind, MD;  Location: [facility];  Service: Obstetrics;  Laterality: N/A;   CHOLECYSTECTOMY     COLONOSCOPY WITH PROPOFOL N/A 08/13/2018   Procedure: COLONOSCOPY WITH PROPOFOL;  Surgeon: Yetta Flock, MD;  Location: [facility];  Service: Gastroenterology;  Laterality: N/A;    DENTAL SURGERY     ESOPHAGOGASTRODUODENOSCOPY  May 2007   Dr. Gala Romney: Normal esophagus, stomach, D1, D2   ESOPHAGOGASTRODUODENOSCOPY  06/16/2013   Dr. Carlton Adam, eosinophilic esophagitis, reactive gastropathy, no esophageal dilation   ESOPHAGOGASTRODUODENOSCOPY (EGD) WITH PROPOFOL N/A 08/13/2018   Procedure: ESOPHAGOGASTRODUODENOSCOPY (EGD) WITH PROPOFOL;  Surgeon: Yetta Flock, MD;  Location: [facility];  Service: Gastroenterology;  Laterality: N/A;   POLYPECTOMY  08/13/2018   Procedure: POLYPECTOMY;  Surgeon: Yetta Flock, MD;  Location: [facility];  Service: Gastroenterology;;   TONSILLECTOMY     TOOTH EXTRACTION  10/28/2011   Procedure: DENTAL RESTORATION/EXTRACTIONS;  Surgeon: Gae Bon, DDS;  Location: [facility];  Service: Oral Surgery;;   UPPER GASTROINTESTINAL ENDOSCOPY       OB History     Gravida  2   Para  1   Term  1   Preterm      AB  1   Living  2      SAB  0  IAB      Ectopic  1   Multiple  1   Live Births  2           Family History  Problem Relation Age of Onset   Depression Mother    Anxiety disorder Mother    High blood pressure Mother    Bipolar disorder Mother    Eating disorder Mother    Obesity Mother    Hypertension Sister    Allergic rhinitis Sister    Colon polyps Maternal Grandmother        65s   Diabetes Maternal Grandmother    Anxiety disorder Maternal Grandmother    COPD Maternal Grandmother    Crohn's disease Maternal Aunt    Cancer Maternal Grandfather        prostate   HIV/AIDS Father    Eating disorder Father    Obesity Father    Liver disease Neg Hx    Angioedema Neg Hx    Eczema Neg Hx    Immunodeficiency Neg Hx    Asthma Neg Hx    Urticaria Neg Hx    Colon cancer Neg Hx    Esophageal cancer Neg Hx    Rectal cancer Neg Hx    Stomach cancer Neg Hx     Social History   Tobacco Use   Smoking status: Former    Packs/day: 0.50    Years: 8.00    Pack years: 4.00    Types: Cigarettes     Quit date: 04/25/2011    Years since quitting: 9.5   Smokeless tobacco: Never  Vaping Use   Vaping Use: Never used  Substance Use Topics   Alcohol use: No   Drug use: No    Home Medications Prior to Admission medications   Medication Sig Start Date End Date Taking? Authorizing Provider  acetaminophen (TYLENOL) 325 MG tablet Take 650 mg by mouth every 6 (six) hours as needed for mild pain, fever or headache.    [provider]  albuterol (VENTOLIN HFA) 108 (90 Base) MCG/ACT inhaler INHALE 2 PUFFS INTO THE LUNGS EVERY 4 HOURS AS NEEDED FOR WHEEZING OR SHORTNESS OF BREATH Patient taking differently: Inhale 2 puffs into the lungs every 4 (four) hours as needed for wheezing or shortness of breath. 12/02/19   Valentina Shaggy, MD  ascorbic acid (VITAMIN C) 500 MG tablet Take 1 tablet (500 mg total) by mouth daily. 09/14/20   Love, Ivan Anchors, PA-C  buPROPion (WELLBUTRIN SR) 100 MG 12 hr tablet Take 2 tablets (200 mg total) by mouth 2 (two) times daily. 10/02/20 10/02/21  Patrecia Pour, NP  busPIRone (BUSPAR) 30 MG tablet Take 0.5 tablets (15 mg total) by mouth daily. 10/02/20 11/01/20  Patrecia Pour, NP  Cyanocobalamin (VITAMIN B-12) 5000 MCG TBDP Take 5,000 mcg by mouth daily.    [provider]  diclofenac Sodium (VOLTAREN) 1 % GEL Apply 2 g topically 4 (four) times daily. 10/24/20   Caccavale, Sophia, PA-C  DULoxetine (CYMBALTA) 20 MG capsule Take 1 capsule (20 mg total) by mouth daily. Patient not taking: No sig reported 10/02/20 11/01/20  Patrecia Pour, NP  fluticasone [facility]) 50 MCG/ACT nasal spray Place 2 sprays into both nostrils 2 (two) times daily.    [provider]  fluticasone (FLOVENT HFA) 110 MCG/ACT inhaler Inhale 2 puffs into the lungs 2 (two) times daily. 06/02/19   Valentina Shaggy, MD  folic acid (FOLVITE) 1 MG tablet Take 1 tablet (1 mg total)  by mouth daily. Patient not taking: No sig reported 08/24/20 11/22/20  Antonieta Pert, MD  gabapentin  (NEURONTIN) 100 MG capsule Take 1 capsule (100 mg total) by mouth 3 (three) times daily. 10/02/20 10/02/21  Patrecia Pour, NP  methocarbamol (ROBAXIN) 500 MG tablet Take 1 tablet (500 mg total) by mouth 2 (two) times daily as needed for muscle spasms. 10/24/20   Caccavale, Sophia, PA-C  montelukast (SINGULAIR) 10 MG tablet Take 1 tablet (10 mg total) by mouth at bedtime. 08/18/20   Dwyane Dee, MD  Multiple Vitamin (MULTIVITAMIN WITH MINERALS) TABS tablet Take 1 tablet by mouth daily. 09/14/20   Love, Ivan Anchors, PA-C  rOPINIRole (REQUIP) 3 MG tablet TAKE 1 TABLET(3 MG) BY MOUTH AT BEDTIME 08/18/20   Patrecia Pour, NP  rOPINIRole (REQUIP) 4 MG tablet Take 4 mg by mouth at bedtime.    [provider]  senna-docusate (SENOKOT-S) 8.6-50 MG tablet Take 2 tablets by mouth daily. 09/14/20   Love, Ivan Anchors, PA-C  silver sulfADIAZINE (SILVADENE) 1 % cream Apply topically 2 (two) times daily. Patient taking differently: Apply 1 application topically 2 (two) times daily. 09/13/20   Love, Ivan Anchors, PA-C  tamsulosin (FLOMAX) 0.4 MG CAPS capsule Take 1 capsule (0.4 mg total) by mouth daily after supper. 09/13/20   Love, Ivan Anchors, PA-C  traMADol (ULTRAM) 50 MG tablet Take 1 tablet (50 mg total) by mouth every 6 (six) hours as needed for severe pain. 09/13/20   Love, Ivan Anchors, PA-C  traZODone (DESYREL) 50 MG tablet Take 0.5 tablets (25 mg total) by mouth at bedtime. 10/02/20   Patrecia Pour, NP  Zinc Sulfate 220 (50 Zn) MG TABS Take 1 tablet (220 mg total) by mouth daily. 09/14/20   Love, Ivan Anchors, PA-C    Allergies    Amoxicillin, Bee venom, Penicillins, Penicillin g, Adhesive [tape], Latex, and Vancomycin  Review of Systems   Review of Systems Ten systems reviewed and are negative for acute change, except as noted in the HPI.   Physical Exam Updated Vital Signs BP 96/65 (BP Location: Left Arm)   Pulse 82   Temp 97.7 F (36.5 C) (Oral)   Resp 18   LMP 10/01/2020   SpO2 98%   Physical Exam Vitals and  nursing note reviewed.  Constitutional:      General: She is not in acute distress.    Appearance: She is well-developed. She is obese. She is not diaphoretic.     Comments: Dried, bright green vomitus noted under her chin and on her blanket  HENT:     Head: Normocephalic and atraumatic.     Right Ear: External ear normal.     Left Ear: External ear normal.     Nose: Nose normal.     Mouth/Throat:     Mouth: Mucous membranes are moist.  Eyes:     General: No scleral icterus.    Conjunctiva/sclera: Conjunctivae normal.  Cardiovascular:     Rate and Rhythm: Normal rate and regular rhythm.     Heart sounds: Normal heart sounds. No murmur heard.   No friction rub. No gallop.  Pulmonary:     Effort: Pulmonary effort is normal. No respiratory distress.     Breath sounds: Normal breath sounds.  Abdominal:     General: Bowel sounds are normal. There is no distension.     Palpations: There is no mass.     Tenderness: There is abdominal tenderness in the right upper quadrant and suprapubic  area. There is no guarding.     Comments: Exam is limited due to body habitus Patient has suprapubic and right upper quadrant pain on exam.  No obvious rebound tenderness.  Musculoskeletal:     Cervical back: Normal range of motion.  Skin:    General: Skin is warm and dry.  Neurological:     Mental Status: She is alert and oriented to person, place, and time.  Psychiatric:        Behavior: Behavior normal.    ED Results / Procedures / Treatments   Labs (all labs ordered are listed, but only abnormal results are displayed) Labs Reviewed  BASIC METABOLIC PANEL - Abnormal; Notable for the following components:      Result Value   Potassium 3.2 (*)    Creatinine, Ser 1.42 (*)    GFR, Estimated 47 (*)    All other components within normal limits  CBC - Abnormal; Notable for the following components:   WBC 12.1 (*)    MCH 25.7 (*)    MCHC 29.0 (*)    RDW 19.6 (*)    Platelets 550 (*)    All  other components within normal limits  RESP PANEL BY RT-PCR (FLU A&B, COVID) [id number]  ETHANOL  RAPID URINE DRUG SCREEN, HOSP PERFORMED  URINALYSIS, ROUTINE W REFLEX MICROSCOPIC  I-STAT BETA HCG BLOOD, ED ([facility], [facility], [facility] ONLY)  TROPONIN I (HIGH SENSITIVITY)  TROPONIN I (HIGH SENSITIVITY)    EKG EKG Interpretation  Date/Time:  Monday October 30 2020 14:21:13 EDT Ventricular Rate:  100 PR Interval:  150 QRS Duration: 146 QT Interval:  382 QTC Calculation: 492 R Axis:   10 Text Interpretation: Normal sinus rhythm Right bundle branch block Abnormal ECG Confirmed by Ripley Fraise [phone]) on 10/31/2020 9:07:42 AM  Radiology DG Chest 1 View  Result Date: 10/30/2020 CLINICAL DATA:  Chest pain. EXAM: CHEST  1 VIEW COMPARISON:  October 24, 2020. FINDINGS: The heart size and mediastinal contours are within normal limits. Both lungs are clear. The visualized skeletal structures are unremarkable. IMPRESSION: No active disease. Electronically Signed   By: Marijo Conception M.D.   On: 10/30/2020 15:32    Procedures Procedures   Medications Ordered in ED Medications  ondansetron (ZOFRAN-ODT) disintegrating tablet 4 mg (4 mg Oral Given 10/30/20 2123)    ED Course  I have reviewed the triage vital signs and the nursing notes.  Pertinent labs & imaging results that were available during my care of the patient were reviewed by me and considered in my medical decision making (see chart for details).  Clinical Course as of 11/01/20 1730  Tue Oct 31, 2020  0946 COnsulted with ED pharmacist for URINE infection coverage- + anaphylactic to PCN- will use ertapenem [AH]  1205 WBC(!): 12.1 [AH]    Clinical Course User Index [AH] Margarita Mail, PA-C   MDM Rules/Calculators/A&P                           45 year old female with history of schizoaffective disorder.  She has complaint of vomiting.  Known multi drug-resistant urinary tract infection.  Urine is currently pending.  I reviewed the case with our  ED pharmacist who recommended ertapenem.  She is getting IV fluids here.  I ordered and reviewed labs including BMP which shows elevated creatinine consistent with renal insufficiency, mild hyperkalemia.  There is a pending hepatic function panel.  Urine is pending.  I ordered and reviewed a  CT abdomen pelvis with contrast which shows no acute abnormalities, 1 view chest x-ray also without abnormality.  Patient will be admitted to the hospitalist service for drug-resistant UTI. Final Clinical Impression(s) / ED Diagnoses Final diagnoses:  Chest pain    Rx / DC Orders ED Discharge Orders     None        Margarita Mail, PA-C 11/01/20 1732    Wyvonnia Dusky, MD 11/01/20 [phone]

## 2020-10-31 NOTE — ED Notes (Signed)
Lab to add on magnesium  

## 2020-10-31 NOTE — ED Notes (Signed)
Pt denies SI to this RN at this moment

## 2020-10-31 NOTE — Progress Notes (Signed)
Pharmacy Antibiotic Note  Heather [name] is a 45 y.o. female admitted on 10/30/2020 with UTI w/o sepsis.  Pharmacy has been consulted for cefepime dosing.  Patient with recent urine culture from [facility] showing enterobacter cloacae senstive.  Plan: Cefepime 2g q12h Monitor cultures and adjust as appropriate Monitor renal function     Temp (24hrs), Avg:98.2 F (36.8 C), Min:97.7 F (36.5 C), Max:98.5 F (36.9 C)  Recent Labs  Lab 10/24/20 1750 10/25/20 0751 10/26/20 1346 10/30/20 1414  WBC 14.3* 12.0* 10.7* 12.1*  CREATININE 1.61* 1.79* 1.77* 1.42*    Estimated Creatinine Clearance: 92 mL/min (A) (by C-G formula based on SCr of 1.42 mg/dL (H)).    Allergies  Allergen Reactions   Amoxicillin Anaphylaxis   Bee Venom Shortness Of Breath   Penicillins Anaphylaxis    Tolerates cephalexin and ceftriaxone Has patient had a PCN reaction causing immediate rash, facial/tongue/throat swelling, SOB or lightheadedness with hypotension: No Has patient had a PCN reaction causing severe rash involving mucus membranes or skin necrosis: No Has patient had a PCN reaction that required hospitalization No Has patient had a PCN reaction occurring within the last 10 years: No If all of the above answers are "NO", then may proceed with Cephalosporin use.'  REACTION: Angioedema   Penicillin G    Adhesive [Tape] Rash   Latex Rash   Vancomycin Other (See Comments)    Pt can tolerate Vancomycin but did cause Vancomycin Infusion Reaction.  Recommend to pre-medicate with Benadryl before doses administered.      Antimicrobials this admission: Ertapenem 7/26 x 1 Cefepime 7/27 >>    Microbiology results: 7/22 Urine Culture from [facility]: Enterobacter cloacae: Cefepime, Ertapenem, Gentamicin, Meropenem, Zosyn, Tetracycline (S)  Cultures this admit pending  Thank you for allowing pharmacy to be a part of this patient's care.  Heloise Purpura 10/31/2020 3:44 PM

## 2020-10-31 NOTE — H&P (Signed)
History and Physical    Heather [name] [id number] DOB: 1975-06-07 DOA: 10/30/2020  PCP: Lucianne Lei, MD Consultants:  pain management.  Patient coming from:  Homeless   Chief Complaint: chest pain, abnormal urine culture   HPI: Heather [name] is a 45 y.o. female with medical history significant of anxiety and depression (hx of bipolar and schizoaffective disorder in chart in past), ACD, panniculitis, debility,GERD,  OSA not on cpap, chronic back pain, who presented for chest pain and palpitations and feeling like her throat was closing while at her behavioral health facility for SI this AM. She also was called by [facility] in regards to her urine culture and was told to come to ER due to multiple drug resistance. . She has had right sided flank pain for a few days, suprapubic pain and some nausea and vomiting. She states she has been hot, but never took her temperature. She denies any dysuria, urgency or frequency. States her urine looks dark, but no gross hematuria.   She has no chest pain, but has some palpitations. She denies shortness of breath , headaches, vision changes. Does have SI with plan to take a lot of her sleeping medication, but states she would not act on this.  Also has VH seeing a little girl go through a door. No HI/AH Recently seen at [facility] ED on 7/22 for SI. She has 43 years old twins that are in temp foster care and is upset about this. She is currently homeless.  The church was paying for her to stay in a motel but have no more funds. She has been to several different hospitals to try and be admitted but unsuccessfully.-per [facility] notes. Has not taken her psychiatry drugs on normal basis  in last 3 weeks.   ED Course: vitals: afebrile, bp: 124/82, HR: 97, RR: 14, oxygen: 100% on room air.  Pertinent labs: Potassium: 3.2, Creatinine: 1.42 (1.4-1.7), Wbc: 12.1 Platelets: 550 (400-600). Urine culture from [facility] on 7/22 grew out >100, 000 DFU of  enterobacter cloacae complex and 10,000-50,000 DFU of corynebacterium. Given invanz IV due to multiple drug resistance and asked to admit.   Review of Systems: As per HPI; otherwise review of systems reviewed and negative.   Ambulatory Status:  Ambulates with walker    Past Medical History:  Diagnosis Date   Allergy    Amenorrhea    Anemia    post partum    Anxiety    Anxiety    Arthritis    Asthma    Back pain    Constipation    COPD (chronic obstructive pulmonary disease) ([place])    Delivery with history of C-section    Depression    Depression    Diabetes mellitus without complication ([place])    patient denies but states she has hyperglycemia-diet controlled   Dysmenorrhea    Dysrhythmia    DR Johnsie Cancel     Ectopic pregnancy 2013   Edema, lower extremity    Eosinophilic esophagitis    Diagnosed at [facility] 06/16/2013, untreated   Gallbladder problem    GERD (gastroesophageal reflux disease)    HEARTBURN   TUMS   Hard to intubate 11/07/2015   High cholesterol    IBS (irritable bowel syndrome)    Leukocytosis 07/28/2008   Qualifier: Diagnosis of  By: Jonna Munro MD, Cornelius     Morbid obesity [facility])    Neuromuscular disorder ([place])    RESTLESS LEG    Obesity    Schizoaffective  disorder, bipolar type ([place])    Sepsis ([place]) 11/11/2014   Shortness of breath    WITH EXERTION    Sleep apnea    CPAP- in process of restarting     Past Surgical History:  Procedure Laterality Date   BIOPSY  08/13/2018   Procedure: BIOPSY;  Surgeon: Yetta Flock, MD;  Location: [facility];  Service: Gastroenterology;;   CESAREAN SECTION MULTI-GESTATIONAL N/A 02/03/2017   Procedure: CESAREAN SECTION MULTI-GESTATIONAL;  Surgeon: Jonnie Kind, MD;  Location: [facility];  Service: Obstetrics;  Laterality: N/A;   CHOLECYSTECTOMY     COLONOSCOPY WITH PROPOFOL N/A 08/13/2018   Procedure: COLONOSCOPY WITH PROPOFOL;  Surgeon: Yetta Flock, MD;  Location: [facility];  Service:  Gastroenterology;  Laterality: N/A;   DENTAL SURGERY     ESOPHAGOGASTRODUODENOSCOPY  May 2007   Dr. Gala Romney: Normal esophagus, stomach, D1, D2   ESOPHAGOGASTRODUODENOSCOPY  06/16/2013   Dr. Carlton Adam, eosinophilic esophagitis, reactive gastropathy, no esophageal dilation   ESOPHAGOGASTRODUODENOSCOPY (EGD) WITH PROPOFOL N/A 08/13/2018   Procedure: ESOPHAGOGASTRODUODENOSCOPY (EGD) WITH PROPOFOL;  Surgeon: Yetta Flock, MD;  Location: [facility];  Service: Gastroenterology;  Laterality: N/A;   POLYPECTOMY  08/13/2018   Procedure: POLYPECTOMY;  Surgeon: Yetta Flock, MD;  Location: [facility];  Service: Gastroenterology;;   TONSILLECTOMY     TOOTH EXTRACTION  10/28/2011   Procedure: DENTAL RESTORATION/EXTRACTIONS;  Surgeon: Gae Bon, DDS;  [facility];  Service: Oral Surgery;;   UPPER GASTROINTESTINAL ENDOSCOPY      Social History   Socioeconomic History   Marital status: Legally Separated    Spouse name: Gwyndolyn Saxon   Number of children: 0   Years of education: Not on file   Highest education level: Not on file  Occupational History   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 had a pack the other day  Other Topics Concern   Not on file  Social History Narrative   Not on file   Social Determinants of Health   Financial Resource Strain: Not on file  Food Insecurity: Not on file  Transportation Needs: Not on file  Physical Activity: Not on file  Stress: Not on file  Social Connections: Not on file  Intimate Partner Violence: Not on file    Allergies  Allergen  Reactions   Amoxicillin Anaphylaxis   Bee Venom Shortness Of Breath   Penicillins Anaphylaxis    Tolerates cephalexin and ceftriaxone Has patient had a PCN reaction causing immediate rash, facial/tongue/throat swelling, SOB or lightheadedness with hypotension: No Has patient had a PCN reaction causing severe rash involving mucus membranes or skin necrosis: No Has patient had a PCN reaction that required hospitalization No Has patient had a PCN reaction occurring within the last 10 years: No If all of the above answers are "NO", then may proceed with Cephalosporin use.'  REACTION: Angioedema   Penicillin G    Adhesive [Tape] Rash   Latex Rash   Vancomycin Other (See Comments)  Pt can tolerate Vancomycin but did cause Vancomycin Infusion Reaction.  Recommend to pre-medicate with Benadryl before doses administered.      Family History  Problem Relation Age of Onset   Depression Mother    Anxiety disorder Mother    High blood pressure Mother    Bipolar disorder Mother    Eating disorder Mother    Obesity Mother    Hypertension Sister    Allergic rhinitis Sister    Colon polyps Maternal Grandmother        68s   Diabetes Maternal Grandmother    Anxiety disorder Maternal Grandmother    COPD Maternal Grandmother    Crohn's disease Maternal Aunt    Cancer Maternal Grandfather        prostate   HIV/AIDS Father    Eating disorder Father    Obesity Father    Liver disease Neg Hx    Angioedema Neg Hx    Eczema Neg Hx    Immunodeficiency Neg Hx    Asthma Neg Hx    Urticaria Neg Hx    Colon cancer Neg Hx    Esophageal cancer Neg Hx    Rectal cancer Neg Hx    Stomach cancer Neg Hx     Prior to Admission medications   Medication Sig Start Date End Date Taking? Authorizing Provider  acetaminophen (TYLENOL) 325 MG tablet Take 650 mg by mouth every 6 (six) hours as needed for mild pain, fever or headache.    [provider]  albuterol (VENTOLIN HFA) 108 (90 Base) MCG/ACT  inhaler INHALE 2 PUFFS INTO THE LUNGS EVERY 4 HOURS AS NEEDED FOR WHEEZING OR SHORTNESS OF BREATH Patient taking differently: Inhale 2 puffs into the lungs every 4 (four) hours as needed for wheezing or shortness of breath. 12/02/19   Valentina Shaggy, MD  ascorbic acid (VITAMIN C) 500 MG tablet Take 1 tablet (500 mg total) by mouth daily. 09/14/20   Love, Ivan Anchors, PA-C  buPROPion (WELLBUTRIN SR) 100 MG 12 hr tablet Take 2 tablets (200 mg total) by mouth 2 (two) times daily. 10/02/20 10/02/21  Patrecia Pour, NP  busPIRone (BUSPAR) 30 MG tablet Take 0.5 tablets (15 mg total) by mouth daily. 10/02/20 11/01/20  Patrecia Pour, NP  Cyanocobalamin (VITAMIN B-12) 5000 MCG TBDP Take 5,000 mcg by mouth daily.    [provider]  diclofenac Sodium (VOLTAREN) 1 % GEL Apply 2 g topically 4 (four) times daily. 10/24/20   Caccavale, Sophia, PA-C  DULoxetine (CYMBALTA) 20 MG capsule Take 1 capsule (20 mg total) by mouth daily. Patient not taking: No sig reported 10/02/20 11/01/20  Patrecia Pour, NP  fluticasone [facility]) 50 MCG/ACT nasal spray Place 2 sprays into both nostrils 2 (two) times daily.    [provider]  fluticasone (FLOVENT HFA) 110 MCG/ACT inhaler Inhale 2 puffs into the lungs 2 (two) times daily. 06/02/19   Valentina Shaggy, MD  folic acid (FOLVITE) 1 MG tablet Take 1 tablet (1 mg total) by mouth daily. Patient not taking: No sig reported 08/24/20 11/22/20  Antonieta Pert, MD  gabapentin (NEURONTIN) 100 MG capsule Take 1 capsule (100 mg total) by mouth 3 (three) times daily. 10/02/20 10/02/21  Patrecia Pour, NP  methocarbamol (ROBAXIN) 500 MG tablet Take 1 tablet (500 mg total) by mouth 2 (two) times daily as needed for muscle spasms. 10/24/20   Caccavale, Sophia, PA-C  montelukast (SINGULAIR) 10 MG tablet Take 1 tablet (10 mg total) by mouth at  bedtime. 08/18/20   Dwyane Dee, MD  Multiple Vitamin (MULTIVITAMIN WITH MINERALS) TABS tablet Take 1 tablet by mouth daily. 09/14/20    Love, Ivan Anchors, PA-C  rOPINIRole (REQUIP) 3 MG tablet TAKE 1 TABLET(3 MG) BY MOUTH AT BEDTIME 08/18/20   Patrecia Pour, NP  rOPINIRole (REQUIP) 4 MG tablet Take 4 mg by mouth at bedtime.    [provider]  senna-docusate (SENOKOT-S) 8.6-50 MG tablet Take 2 tablets by mouth daily. 09/14/20   Love, Ivan Anchors, PA-C  silver sulfADIAZINE (SILVADENE) 1 % cream Apply topically 2 (two) times daily. Patient taking differently: Apply 1 application topically 2 (two) times daily. 09/13/20   Love, Ivan Anchors, PA-C  tamsulosin (FLOMAX) 0.4 MG CAPS capsule Take 1 capsule (0.4 mg total) by mouth daily after supper. 09/13/20   Love, Ivan Anchors, PA-C  traMADol (ULTRAM) 50 MG tablet Take 1 tablet (50 mg total) by mouth every 6 (six) hours as needed for severe pain. 09/13/20   Love, Ivan Anchors, PA-C  traZODone (DESYREL) 50 MG tablet Take 0.5 tablets (25 mg total) by mouth at bedtime. 10/02/20   Patrecia Pour, NP  Zinc Sulfate 220 (50 Zn) MG TABS Take 1 tablet (220 mg total) by mouth daily. 09/14/20   Bary Leriche, PA-C    Physical Exam: Vitals:   10/31/20 0106 10/31/20 0426 10/31/20 0650 10/31/20 1429  BP: (!) 124/48 100/68 96/65 120/66  Pulse: 96 95 82 86  Resp: '18 18 18 18  '$ Temp: 98.5 F (36.9 C) 98.4 F (36.9 C) 97.7 F (36.5 C)   TempSrc: Oral Oral Oral   SpO2: 99% 96% 98% 98%     General:  Appears calm and comfortable and is in NAD Eyes:  PERRL, EOMI, normal lids, iris ENT:  grossly normal hearing, lips & tongue, mmm; no teeth  Neck:  no LAD, masses or thyromegaly; no carotid bruits Cardiovascular:  RRR, no m/r/g. Large LE, chronic edema vs. Fat vs. Lymphedema.  Respiratory:   CTA bilaterally with no wheezes/rales/rhonchi.  Normal respiratory effort. Abdomen:  soft, NT, TTP in suprapubic area and RLQ.  NABS Back:   normal alignment, +right CVA tenderness  Skin:  panniculitis  Musculoskeletal:  grossly normal tone BUE/BLE, good ROM, no bony abnormality Lower extremity:   Limited foot exam with no  ulcerations.  2+ distal pulses. Psychiatric:  grossly normal mood and affect, speech fluent and appropriate, Aox3. +SI with plan to take all of her sleeping pills, would not act on it right now. +VH seeing a little girl go through a door.  Neurologic:  CN 2-12 grossly intact, moves all extremities in coordinated fashion, sensation intact    Radiological Exams on Admission: Independently reviewed - see discussion in A/P where applicable  DG Chest 1 View  Result Date: 10/30/2020 CLINICAL DATA:  Chest pain. EXAM: CHEST  1 VIEW COMPARISON:  October 24, 2020. FINDINGS: The heart size and mediastinal contours are within normal limits. Both lungs are clear. The visualized skeletal structures are unremarkable. IMPRESSION: No active disease. Electronically Signed   By: Marijo Conception M.D.   On: 10/30/2020 15:32   CT ABDOMEN PELVIS W CONTRAST  Result Date: 10/31/2020 CLINICAL DATA:  Right lower quadrant pain, nausea, vomiting and diarrhea. EXAM: CT ABDOMEN AND PELVIS WITH CONTRAST TECHNIQUE: Multidetector CT imaging of the abdomen and pelvis was performed using the standard protocol following bolus administration of intravenous contrast. CONTRAST:  100 mL OMNIPAQUE IOHEXOL 300 MG/ML  SOLN COMPARISON:  CT abdomen  and pelvis 10/24/2020. FINDINGS: Lower chest: Lung bases clear.  No pleural or pericardial effusion. Hepatobiliary: No focal liver abnormality is seen. Status post cholecystectomy. No biliary dilatation. There is diffuse low-attenuation of the liver consistent with fatty infiltration. The liver measures approximately 25 cm craniocaudal. Pancreas: Unremarkable. No pancreatic ductal dilatation or surrounding inflammatory changes. Spleen: Normal in size without focal abnormality. Adrenals/Urinary Tract: The adrenal glands appear normal. Right renal cyst is unchanged. Staghorn calculus in the left kidney is again seen. The patient has multiple right renal stones. The largest measures 1.8 cm in diameter.  Negative for hydronephrosis or ureteral stone. The urinary bladder is unremarkable. Stomach/Bowel: Stomach is within normal limits. Appendix appears normal. No evidence of bowel wall thickening, distention, or inflammatory changes. Vascular/Lymphatic: No significant vascular findings are present. No enlarged abdominal or pelvic lymph nodes. Reproductive: Uterus and bilateral adnexa are unremarkable. Other: None. Musculoskeletal: No acute or focal abnormality. Multilevel lumbar degenerative change noted. IMPRESSION: No acute abnormality abdomen or pelvis. Hepatomegaly and fatty infiltration of the liver. Bilateral nonobstructing stones including a staghorn calculus on the left. Electronically Signed   By: Inge Rise M.D.   On: 10/31/2020 13:53    EKG: Independently reviewed.  NSR with rate 100 and RBBB; nonspecific ST changes with no evidence of acute ischemia. Similar to previous ekg.    Labs on Admission: I have personally reviewed the available labs and imaging studies at the time of the admission.  Pertinent labs:  Potassium: 3.2 Creatinine: 1.42 (1.4-1.7) Wbc: 12.1 Platelets: 550 (400-600)    Assessment/Plan Principal Problem:   Complicated UTI (urinary tract infection) Patient with known culture with multiple drug resistance, right CVA tenderness, leukocytosis, suprapubic pain, n/v meets criteria for complicated UTI -culture in care everywhere from [facility]  -place in observation and discussed abx with pharmacy. Initially given dose of invanz, but patient has tolerated cephalosporins in the past so changed to cefepime to decrease risk of drug resistance.  -CT shows 3 nonobstructive right renal calculi and left lower ple has a renal staghorn calculi measuring up to 5.3cm.  NO hydronephrosis.  -zofran prn for nausea and vomiting  -tylenol and tramadol for pain control.   Active Problems:   PALPITATIONS, OCCASIONAL -appears to be an ongoing complaint -troponin negative x  2 -continue telemetry -recent TSH wnl on 10/27/20.     Hypokalemia -replace orally x 1 and recheck in AM -magnesium pending     Panniculitis -hygiene care, clotrimazole bid and nystatin powder  Suicidal ideation Has a plan, but no intent Psychiatry consult Suicide precautions.     Generalized anxiety disorder Continue her buspar and gabapentin, but not taking consistently     Major depression, chronic Continue wellbutrin and f/u with psychiatry recommendations.     Asthma -continue home inhalers and singulair    Thrombocytosis At baseline, unsure if she has seen hematology outpatient    CKD (chronic kidney disease), stage III ([place]) -possibly new diagnosis for her. Creatinine 3 months ago was 3.36.  -stable, continue to monitor and encourage her to avoid nephrotoxic drugs.     OSA (obstructive sleep apnea)  Does not use a cpap at home.   Homelessness SW consult   There is no height or weight on file to calculate BMI.     Level of care: Telemetry Medical DVT prophylaxis:  Lovenox  Code Status:  Full - confirmed with patient Family Communication: None present Disposition Plan:  The patient is from: home  Anticipated d/c is to: shelter vs.  Other recommendation by SW  Requires inpatient hospitalization for complicated UTI requiring IV medication.   Consults called: psychiatry and social work  Admission status:  observation    Orma Flaming MD [facility]   How to contact the [facility] Attending or Consulting provider [place] or covering provider during after hours [place], for this patient?  Check the care team in [facility] and look for a) attending/consulting TRH provider listed and b) the [facility] Behavioral team listed Log into [email or web address] and use [place]'s universal password to access. If you do not have the password, please contact the hospital operator. Locate the [facility] provider you are looking for under [facility] and page to a number that you can be directly  reached. If you still have difficulty reaching the provider, please page the [facility] (Director on Call) for the Hospitalists listed on amion for assistance.   10/31/2020, 7:03 PM

## 2020-10-31 NOTE — ED Notes (Signed)
Given warm blankets and socks.

## 2020-10-31 NOTE — ED Notes (Addendum)
Pt mentions recent treatment of and resistance of UTI, seen at [facility], imaging and EKG reviewed. Pending covid swab and urine.

## 2020-10-31 NOTE — ED Notes (Signed)
Pt in imaging

## 2020-11-01 ENCOUNTER — Encounter (HOSPITAL_COMMUNITY): Payer: Self-pay | Admitting: Family Medicine

## 2020-11-01 ENCOUNTER — Other Ambulatory Visit: Payer: Self-pay

## 2020-11-01 DIAGNOSIS — N183 Chronic kidney disease, stage 3 unspecified: Secondary | ICD-10-CM | POA: Diagnosis not present

## 2020-11-01 DIAGNOSIS — I959 Hypotension, unspecified: Secondary | ICD-10-CM | POA: Diagnosis not present

## 2020-11-01 DIAGNOSIS — Z23 Encounter for immunization: Secondary | ICD-10-CM | POA: Diagnosis not present

## 2020-11-01 DIAGNOSIS — J449 Chronic obstructive pulmonary disease, unspecified: Secondary | ICD-10-CM | POA: Diagnosis present

## 2020-11-01 DIAGNOSIS — F4310 Post-traumatic stress disorder, unspecified: Secondary | ICD-10-CM | POA: Diagnosis present

## 2020-11-01 DIAGNOSIS — J45909 Unspecified asthma, uncomplicated: Secondary | ICD-10-CM | POA: Diagnosis not present

## 2020-11-01 DIAGNOSIS — D75839 Thrombocytosis, unspecified: Secondary | ICD-10-CM | POA: Diagnosis present

## 2020-11-01 DIAGNOSIS — F329 Major depressive disorder, single episode, unspecified: Secondary | ICD-10-CM | POA: Diagnosis not present

## 2020-11-01 DIAGNOSIS — N390 Urinary tract infection, site not specified: Principal | ICD-10-CM

## 2020-11-01 DIAGNOSIS — G4700 Insomnia, unspecified: Secondary | ICD-10-CM | POA: Diagnosis present

## 2020-11-01 DIAGNOSIS — Z20822 Contact with and (suspected) exposure to covid-19: Secondary | ICD-10-CM | POA: Diagnosis present

## 2020-11-01 DIAGNOSIS — F411 Generalized anxiety disorder: Secondary | ICD-10-CM | POA: Diagnosis present

## 2020-11-01 DIAGNOSIS — E1122 Type 2 diabetes mellitus with diabetic chronic kidney disease: Secondary | ICD-10-CM | POA: Diagnosis present

## 2020-11-01 DIAGNOSIS — F332 Major depressive disorder, recurrent severe without psychotic features: Secondary | ICD-10-CM | POA: Diagnosis not present

## 2020-11-01 DIAGNOSIS — F3130 Bipolar disorder, current episode depressed, mild or moderate severity, unspecified: Secondary | ICD-10-CM | POA: Diagnosis present

## 2020-11-01 DIAGNOSIS — Z1624 Resistance to multiple antibiotics: Secondary | ICD-10-CM | POA: Diagnosis not present

## 2020-11-01 DIAGNOSIS — N179 Acute kidney failure, unspecified: Secondary | ICD-10-CM | POA: Diagnosis not present

## 2020-11-01 DIAGNOSIS — F410 Panic disorder [episodic paroxysmal anxiety] without agoraphobia: Secondary | ICD-10-CM | POA: Diagnosis not present

## 2020-11-01 DIAGNOSIS — E876 Hypokalemia: Secondary | ICD-10-CM | POA: Diagnosis present

## 2020-11-01 DIAGNOSIS — B964 Proteus (mirabilis) (morganii) as the cause of diseases classified elsewhere: Secondary | ICD-10-CM | POA: Diagnosis not present

## 2020-11-01 DIAGNOSIS — G4733 Obstructive sleep apnea (adult) (pediatric): Secondary | ICD-10-CM | POA: Diagnosis not present

## 2020-11-01 DIAGNOSIS — N1832 Chronic kidney disease, stage 3b: Secondary | ICD-10-CM | POA: Diagnosis present

## 2020-11-01 DIAGNOSIS — F331 Major depressive disorder, recurrent, moderate: Secondary | ICD-10-CM | POA: Diagnosis not present

## 2020-11-01 DIAGNOSIS — G2581 Restless legs syndrome: Secondary | ICD-10-CM | POA: Diagnosis present

## 2020-11-01 DIAGNOSIS — E662 Morbid (severe) obesity with alveolar hypoventilation: Secondary | ICD-10-CM | POA: Diagnosis present

## 2020-11-01 DIAGNOSIS — D631 Anemia in chronic kidney disease: Secondary | ICD-10-CM | POA: Diagnosis present

## 2020-11-01 DIAGNOSIS — F5105 Insomnia due to other mental disorder: Secondary | ICD-10-CM | POA: Diagnosis present

## 2020-11-01 DIAGNOSIS — T24312A Burn of third degree of left thigh, initial encounter: Secondary | ICD-10-CM | POA: Diagnosis not present

## 2020-11-01 DIAGNOSIS — F339 Major depressive disorder, recurrent, unspecified: Secondary | ICD-10-CM | POA: Diagnosis not present

## 2020-11-01 DIAGNOSIS — Z6841 Body Mass Index (BMI) 40.0 and over, adult: Secondary | ICD-10-CM | POA: Diagnosis not present

## 2020-11-01 DIAGNOSIS — R45851 Suicidal ideations: Secondary | ICD-10-CM | POA: Diagnosis present

## 2020-11-01 LAB — BASIC METABOLIC PANEL
Anion gap: 6 (ref 5–15)
BUN: 13 mg/dL (ref 6–20)
CO2: 27 mmol/L (ref 22–32)
Calcium: 8.9 mg/dL (ref 8.9–10.3)
Chloride: 102 mmol/L (ref 98–111)
Creatinine, Ser: 1.57 mg/dL — ABNORMAL HIGH (ref 0.44–1.00)
GFR, Estimated: 42 mL/min — ABNORMAL LOW (ref >60)
Glucose, Bld: 83 mg/dL (ref 70–99)
Potassium: 3.3 mmol/L — ABNORMAL LOW (ref 3.5–5.1)
Sodium: 135 mmol/L (ref 135–145)

## 2020-11-01 LAB — CBC
HCT: 33.2 % — ABNORMAL LOW (ref 36.0–46.0)
Hemoglobin: 9.8 g/dL — ABNORMAL LOW (ref 12.0–15.0)
MCH: 25.5 pg — ABNORMAL LOW (ref 26.0–34.0)
MCHC: 29.5 g/dL — ABNORMAL LOW (ref 30.0–36.0)
MCV: 86.5 fL (ref 80.0–100.0)
Platelets: 428 10*3/uL — ABNORMAL HIGH (ref 150–400)
RBC: 3.84 MIL/uL — ABNORMAL LOW (ref 3.87–5.11)
RDW: 19.2 % — ABNORMAL HIGH (ref 11.5–15.5)
WBC: 8 10*3/uL (ref 4.0–10.5)
nRBC: 0 % (ref 0.0–0.2)

## 2020-11-01 LAB — TROPONIN I (HIGH SENSITIVITY)
Troponin I (High Sensitivity): 6 ng/L (ref <18)
Troponin I (High Sensitivity): 6 ng/L (ref <18)

## 2020-11-01 MED ORDER — SODIUM CHLORIDE 0.9 % IV BOLUS
1000.0000 mL | Freq: Once | INTRAVENOUS | Status: AC
  Administered 2020-11-01: 1000 mL via INTRAVENOUS

## 2020-11-01 MED ORDER — GABAPENTIN 100 MG PO CAPS
200.0000 mg | ORAL_CAPSULE | Freq: Three times a day (TID) | ORAL | Status: DC
  Administered 2020-11-01 – 2020-12-14 (×129): 200 mg via ORAL
  Filled 2020-11-01 (×130): qty 2

## 2020-11-01 MED ORDER — TRAZODONE HCL 50 MG PO TABS
50.0000 mg | ORAL_TABLET | Freq: Every day | ORAL | Status: DC
  Administered 2020-11-01 – 2021-01-04 (×65): 50 mg via ORAL
  Filled 2020-11-01 (×66): qty 1

## 2020-11-01 MED ORDER — NITROGLYCERIN 0.4 MG SL SUBL
0.4000 mg | SUBLINGUAL_TABLET | SUBLINGUAL | Status: DC | PRN
  Administered 2020-11-01 – 2020-11-11 (×5): 0.4 mg via SUBLINGUAL
  Filled 2020-11-01 (×5): qty 1

## 2020-11-01 MED ORDER — POTASSIUM CHLORIDE CRYS ER 20 MEQ PO TBCR
20.0000 meq | EXTENDED_RELEASE_TABLET | Freq: Once | ORAL | Status: AC
  Administered 2020-11-01: 20 meq via ORAL
  Filled 2020-11-01: qty 1

## 2020-11-01 NOTE — Plan of Care (Signed)
  Problem: Education: Goal: Knowledge of General Education information will improve Description: Including pain rating scale, medication(s)/side effects and non-pharmacologic comfort measures Outcome: Progressing   Problem: Health Behavior/Discharge Planning: Goal: Ability to manage health-related needs will improve Outcome: Progressing   Problem: Clinical Measurements: Goal: Ability to maintain clinical measurements within normal limits will improve Outcome: Progressing   Problem: Activity: Goal: Risk for activity intolerance will decrease Outcome: Progressing   Problem: Safety: Goal: Ability to remain free from injury will improve Outcome: Progressing   Problem: Health Behavior/Discharge (Transition) Planning: Goal: Ability to manage health-related needs will improve Outcome: Progressing   Problem: Medication Management: Goal: Adhere to prescribed medication regimen Outcome: Progressing

## 2020-11-01 NOTE — TOC Initial Note (Signed)
Transition of Care [facility]) - Initial/Assessment Note    Patient Details  Name: Heather [name] MRN: [id number] Date of Birth: 1975-12-30  Transition [facility] Care [facility]) CM/SW Contact:    Ninfa Meeker, RN Phone Number: 11/01/2020, 12:20 PM  Clinical Narrative:  Patient has a Case Manager with [facility] [phone]. Call was received requesting that patient be placed in SNF or other living situation. CM will speak with Education officer, museum . TOC Team will continue to follow.         Patient Goals and CMS Choice        Expected Discharge Plan and Services                                                Prior Living Arrangements/Services                       Activities of Daily Living Home Assistive Devices/Equipment: Gilford Rile (specify type) ADL Screening (condition at time of admission) Patient's cognitive ability adequate to safely complete daily activities?: Yes Is the patient deaf or have difficulty hearing?: No Does the patient have difficulty seeing, even when wearing glasses/contacts?: No Does the patient have difficulty concentrating, remembering, or making decisions?: No Patient able to express need for assistance with ADLs?: Yes Does the patient have difficulty dressing or bathing?: Yes Independently performs ADLs?: No Communication: Independent Dressing (OT): Needs assistance Is this a change from baseline?: Pre-admission baseline Grooming: Needs assistance Is this a change from baseline?: Pre-admission baseline Feeding: Independent Bathing: Needs assistance Is this a change from baseline?: Pre-admission baseline Toileting: Needs assistance Is this a change from baseline?: Pre-admission baseline In/Out Bed: Needs assistance Is this a change from baseline?: Pre-admission baseline Walks in Home: Needs assistance Is this a change from baseline?: Pre-admission baseline Does the patient have difficulty  walking or climbing stairs?: Yes Weakness of Legs: Both Weakness of Arms/Hands: None  Permission Sought/Granted                  Emotional Assessment              Admission diagnosis:  Complicated UTI (urinary tract infection) [N39.0] Chest pain [R07.9] Urinary tract infection without hematuria, site unspecified [N39.0] Patient Active Problem List   Diagnosis Date Noted   Complicated UTI (urinary tract infection) 10/31/2020   Thrombocytosis 10/31/2020   Hypokalemia 10/31/2020   CKD (chronic kidney disease), stage III ([place]) 10/31/2020   Adjustment disorder with mixed anxiety and depressed mood 10/27/2020   Major depression, chronic 09/13/2020   Insomnia    Anemia of chronic disease    Hyponatremia    AKI (acute kidney injury) ([place])    Chronic bilateral low back pain without sciatica    Debility 08/24/2020   SIRS (systemic inflammatory response syndrome) ([place]) 08/18/2020   Pressure injury of skin 08/11/2020   Rhabdomyolysis 08/10/2020   Acute renal failure ([place]) 08/10/2020   PTSD (post-traumatic stress disorder) 05/15/2020   Generalized anxiety disorder 05/05/2020   Blood in stool    Gastritis and gastroduodenitis    Benign neoplasm of sigmoid colon    Difficult intubation 08/10/2018   Class 3 severe obesity with serious comorbidity and body mass index (BMI) greater than or equal to 70 in adult ([place]) 06/16/2018   Nexplanon insertion 03/27/2017   Postpartum hypertension  03/05/2017   Status post primary low transverse cesarean section 02/03/2017   H/O pre-eclampsia 01/27/2017   Gestational htn w/o significant proteinuria, third trimester 01/20/2017   Mild persistent asthma without complication [id number]   Perennial allergic rhinitis 11/05/2016   Mild persistent asthma with acute exacerbation 11/05/2016   Excess weight gain in pregnancy, second trimester 10/30/2016   Hard to intubate 11/07/2015   Hypoglycemia 11/07/2015   OSA (obstructive sleep apnea) 11/07/2015    DM type 2 (diabetes mellitus, type 2) ([place]) 12/07/2014   Panniculitis 12/06/2014   Cellulitis, abdominal wall 11/11/2014   Abdominal pain 07/14/2014   Loose stools 07/14/2014   Melena [id number]   Eosinophilic esophagitis [id number]   Change in bowel habits 04/28/2013   Esophageal dysphagia 04/28/2013   Insomnia due to mental disorder(327.02) 08/08/2011   RLS (restless legs syndrome) 08/08/2011   PALPITATIONS, OCCASIONAL 11/01/2009   Leucocytosis 07/28/2008   ALLERGIC RHINITIS, SEASONAL [id number]   DYSMETABOLIC SYNDROME [id number]   Morbid obesity ([place]) 05/13/2006   EXTERNAL HEMORRHOIDS 05/13/2006   HYPERLIPIDEMIA 05/12/2006   Essential hypertension 05/12/2006   Asthma 05/12/2006   OSTEOARTHRITIS 05/12/2006   PCP:  Lucianne Lei, MD Pharmacy:   [facility] [place], [place] - [address] [place] [address] Phone: [phone] Fax: [phone]     Social Determinants of Health (SDOH) Interventions    Readmission Risk Interventions No flowsheet data found.

## 2020-11-01 NOTE — Consult Note (Signed)
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 increased anxiety. Will  restart patient's Wellbutrin this is the best medication for her mood instability as there is some concern that the patient may have Bipolar disorder as she did describe some episodes that appeared to resemble manic episodes; however it was not possible to truly dx Bipolar based on patient's vague ability to tell what happened during these episodes. Will also restart Gabapentin to help with mood stabilization but will increase from her home dose and this may also help with some of the acute neuropathic described  back pain patient reported. Will restart home Trazodone to help with sleep. None of these drugs are associated with weight gain. Patient Qtc was 32. - Recommend d'c sitter as patient is able to contract for safety in the hospital - Start Gabapentin '200mg'$  TID - Recommend continue home Wellbutrin SR '200mg'$  BID - Start Trazodone '50mg'$  QHS - Recommend nutrition consult - Recommend consult to [facility] to assit with housing and social concerns. Will continue to follow. Disposition: No evidence of imminent risk to self or others at present.    PGY-2 Freida Busman, MD 11/01/2020 10:06 AM

## 2020-11-01 NOTE — TOC Progression Note (Signed)
Transition of Care [facility]) - Progression Note    Patient Details  Name: [name] MRN: [id number] Date of Birth: 1975/11/09  Transition of Care [facility]) CM/SW [place], [place] Phone Number: 11/01/2020, 1:52 PM  Clinical Narrative:    CSW was consulted by physician to speak with pt concerning information for housing resources.  CSW met with pt at bedside.  Pt stated" she does not have a case working with DSS because they will not call her back."  CSW encouraged pt to contact DSS for housing.  Pt became tearful during conversation.  Pt stated " [facility] and [place] agency told her she needs PT.  CSW explained the process for pt and that PT would make the recommendations.  CSW left shelter and housing resources for pt at bedside.         Expected Discharge Plan and Services                                                 Social Determinants of Health (SDOH) Interventions    Readmission Risk Interventions No flowsheet data found.

## 2020-11-01 NOTE — Progress Notes (Signed)
NEW ADMISSION NOTE New Admission Note:  Pt arrived on stretcher with sitter from the ED.    Arrival Method: Stretcher   Mental Orientation: A&O x4 Telemetry: [id number] Assessment: Completed Skin: Pt has reddened areas and rash underneath pannus, breast and armpits. Pt has a healed scab between her fourth and right toe on her right foot. Pt right lower leg was noted to be red and warm.  IV: Right forearm  Pain: 0/10  Tubes: none present  Safety Measures: Safety Fall Prevention Plan has been given, discussed and signed  Admission: Completed  [facility] [place] Orientation: Patient has been orientated to the room, unit and staff. Family: none present   Orders have been reviewed and implemented. Will continue to monitor the patient. Call light has been placed within reach and bed alarm has been activated.

## 2020-11-01 NOTE — Progress Notes (Addendum)
PROGRESS NOTE    Heather [name]  [id number] DOB: 08/16/1975 DOA: 10/30/2020 PCP: Lucianne Lei, MD   Chief Complain: Chest pain  Brief Narrative: Patient is a 45 year old female with history of anxiety/depression/bipolar disorder/she is affective disorder, pancreatitis, GERD, OSA not on CPAP, chronic back pain who presented with chest pain, palpitation.  She initially went to behavioral [facility] for suicidal thoughts but without any plan, developed chest pain there and sent to the emergency department.When she presented to the emergency department, her chest pain has resolved.  She also recently had a urine culture done at [facility] which showed multidrug-resistant organism and was advised to go to the hospital.  Patient also complained of right-sided flank pain for few days, suprapubic pain, with some nausea and vomiting .  Patient admitted of having suicidal ideation and also reported visual hallucination.  She is currently homeless.  Patient was admitted here for the management of multidrug-resistant UTI, suicidal ideation, homelessness.  Psychiatry/TOC consulted  Assessment & Plan:   Principal Problem:   Complicated UTI (urinary tract infection) Active Problems:   Asthma   PALPITATIONS, OCCASIONAL   Panniculitis   OSA (obstructive sleep apnea)   Generalized anxiety disorder   Major depression, chronic   Thrombocytosis   Hypokalemia   CKD (chronic kidney disease), stage III ([place])   Multidrug-resistant UTI: Recently had urine culture done at [facility] which showed >100, 000 DFU of enterobacter cloacae complex and 10,000-50,000 DFU of corynebacterium.  Her urine culture on 5/25 also had shown Enterobacter cloacae which was multidrug-resistant. I suspect colonization.   Also reported nausea, vomiting, right CVA tenderness, suprapubic pain.  Had leukocytosis on presentation.  She was initially started on Invanz, later changed to cefepime.  We will  continue cefepime for total of 3 days duration. CT abdomen showed 3 mm nonobstructive right renal calculi and left lower pole stones renal staghorn calculi measuring up to 5.3 cm.  No hydronephrosis. Continue current antibiotic. Regarding management of her renal stones, she needs to follow-up with urology as an outpatient.  Suicidal ideation: Has a plan but no intent.  Psychiatry consulted, not recommending inpatient psychiatric admission.  Sitter discontinued.  Recommended to continue Wellbutrin, gabapentin, trazodone  Chest pressure/pain: Complains of chest pressure.  Troponin was negative on presentation.  I think chest pressure/pain is atypical  History of generalized anxiety disorder/depression: Psychiatry consulted.  On BuSpar, gabapentin at home ,also on Wellbutrin  Panniculitis: Continue clotrimazole, nystatin  History of asthma: Currently not in exacerbation.  On Singulair and home inhaler  CKD stage IIIa: Continue monitoring.  Currently kidney function at baseline  Hypokalemia: Supplemented with potassium.  OSA: Not on CPAP at home.  We recommend sleep study as an outpatient  Homelessness: Social worker consulted  Hypotension: Likely her baseline blood pressure is on the lower side.  We will bolus her with a liter of normal saline.  Continue to monitor blood pressure  Super morbid obesity: BMI of 75          DVT prophylaxis: Lovenox Code Status: Full Family Communication: None at bedside Status is: Observation  The patient remains OBS appropriate and will d/c before 2 midnights.  Dispo: The patient is from: Home              Anticipated d/c is to: Home              Patient currently is not medically stable to d/c.   Difficult to place patient No  Consultants: None  Procedures:None  Antimicrobials:  Anti-infectives (From admission, onward)    Start     Dose/Rate Route Frequency Ordered Stop   11/01/20 1000  ceFEPIme (MAXIPIME) 2 g in sodium chloride  0.9 % 100 mL IVPB        2 g 200 mL/hr over 30 Minutes Intravenous Every 12 hours 10/31/20 1550     11/01/20 0600  ceFEPIme (MAXIPIME) 2 g in sodium chloride 0.9 % 100 mL IVPB  Status:  Discontinued        2 g 200 mL/hr over 30 Minutes Intravenous Every 8 hours 10/31/20 1541 10/31/20 1550   10/31/20 1000  ertapenem (INVANZ) 1,000 mg in sodium chloride 0.9 % 100 mL IVPB  Status:  Discontinued        1 g 200 mL/hr over 30 Minutes Intravenous Every 24 hours 10/31/20 0948 10/31/20 1541       Subjective: Patient seen and examined the bedside this morning.  Blood pressure soft but stable.  Complains of some chest pressure.  Denies any abdominal pain or dysuria.  Looks comfortable  Objective: Vitals:   10/31/20 2140 10/31/20 2335 11/01/20 0032 11/01/20 0623  BP: 106/61 113/62  (!) 96/57  Pulse: 85 78  78  Resp: '20 18  15  '$ Temp: 98.1 F (36.7 C) 98.5 F (36.9 C)  98.2 F (36.8 C)  TempSrc:  Oral  Oral  SpO2: 100% 93%  94%  Weight:   (!) 191.9 kg (!) 193.2 kg  Height:   '5\' 3"'$  (1.6 m)     Intake/Output Summary (Last 24 hours) at 11/01/2020 0818 Last data filed at 10/31/2020 1305 Gross per 24 hour  Intake 600 ml  Output --  Net 600 ml   Filed Weights   11/01/20 0032 11/01/20 0623  Weight: (!) 191.9 kg (!) 193.2 kg    Examination:  General exam: Overall comfortable, not in distress, super morbid obesity HEENT: PERRL Respiratory system:  no wheezes or crackles  Cardiovascular system: S1 & S2 heard, RRR.  Gastrointestinal system: Abdomen is nondistended, soft and nontender. Central nervous system: Alert and oriented Extremities: Bilateral lower extremity lymphedema , no clubbing ,no cyanosis Skin: No rashes, no ulcers,no icterus      Data Reviewed: I have personally reviewed following labs and imaging studies  CBC: Recent Labs  Lab 10/26/20 1346 10/30/20 1414 11/01/20 0350  WBC 10.7* 12.1* 8.0  NEUTROABS 7.6  --   --   HGB 11.7* 12.4 9.8*  HCT 40.4 42.7 33.2*  MCV  87.3 88.4 86.5  PLT 493* 550* 123456*   Basic Metabolic Panel: Recent Labs  Lab 10/26/20 1346 10/30/20 1414 10/31/20 1908 11/01/20 0350  NA 138 137  --  135  K 3.5 3.2*  --  3.3*  CL 102 102  --  102  CO2 23 25  --  27  GLUCOSE 85 98  --  83  BUN 14 8  --  13  CREATININE 1.77* 1.42*  --  1.57*  CALCIUM 9.6 9.2  --  8.9  MG  --   --  2.1  --    GFR: Estimated Creatinine Clearance: 80.1 mL/min (A) (by C-G formula based on SCr of 1.57 mg/dL (H)). Liver Function Tests: Recent Labs  Lab 10/26/20 1346 10/31/20 1330  AST 28 30  ALT 25 23  ALKPHOS 61 53  BILITOT 0.6 0.4  PROT 7.5 7.3  ALBUMIN 3.4* 3.1*   No results for input(s): LIPASE, AMYLASE in the last 168  hours. No results for input(s): AMMONIA in the last 168 hours. Coagulation Profile: No results for input(s): INR, PROTIME in the last 168 hours. Cardiac Enzymes: No results for input(s): CKTOTAL, CKMB, CKMBINDEX, TROPONINI in the last 168 hours. BNP (last 3 results) No results for input(s): PROBNP in the last 8760 hours. HbA1C: No results for input(s): HGBA1C in the last 72 hours. CBG: No results for input(s): GLUCAP in the last 168 hours. Lipid Profile: No results for input(s): CHOL, HDL, LDLCALC, TRIG, CHOLHDL, LDLDIRECT in the last 72 hours. Thyroid Function Tests: No results for input(s): TSH, T4TOTAL, FREET4, T3FREE, THYROIDAB in the last 72 hours. Anemia Panel: No results for input(s): VITAMINB12, FOLATE, FERRITIN, TIBC, IRON, RETICCTPCT in the last 72 hours. Sepsis Labs: No results for input(s): PROCALCITON, LATICACIDVEN in the last 168 hours.  Recent Results (from the past 240 hour(s))  Urine Culture     Status: Abnormal   Collection Time: 10/24/20  8:16 PM   Specimen: Urine, Clean Catch  Result Value Ref Range Status   Specimen Description URINE, CLEAN CATCH  Final   Special Requests   Final    NONE Performed at [facility], [address], [place], [place] [address]    Culture (A)  Final     >=100,000 COLONIES/mL MULTIPLE SPECIES PRESENT, SUGGEST RECOLLECTION   Report Status 10/26/2020 FINAL  Final  Resp Panel by RT-PCR (Flu A&B, Covid) Nasopharyngeal Swab     Status: None   Collection Time: 10/26/20  1:46 PM   Specimen: Nasopharyngeal Swab; Nasopharyngeal(NP) swabs in vial transport medium  Result Value Ref Range Status   SARS Coronavirus 2 by RT PCR NEGATIVE NEGATIVE Final    Comment: (NOTE) SARS-CoV-2 target nucleic acids are NOT DETECTED.  The SARS-CoV-2 RNA is generally detectable in upper respiratory specimens during the acute phase of infection. The lowest concentration of SARS-CoV-2 viral copies this assay can detect is 138 copies/mL. A negative result does not preclude SARS-Cov-2 infection and should not be used as the sole basis for treatment or other patient management decisions. A negative result may occur with  improper specimen collection/handling, submission of specimen other than nasopharyngeal swab, presence of viral mutation(s) within the areas targeted by this assay, and inadequate number of viral copies(<138 copies/mL). A negative result must be combined with clinical observations, patient history, and epidemiological information. The expected result is Negative.  Fact Sheet for Patients:  EntrepreneurPulse.com.au  Fact Sheet for Healthcare Providers:  IncredibleEmployment.be  This test is no t yet approved or cleared by the [place] FDA and  has been authorized for detection and/or diagnosis of SARS-CoV-2 by FDA under an Emergency Use Authorization (EUA). This EUA will remain  in effect (meaning this test can be used) for the duration of the COVID-19 declaration under Section 564(b)(1) of the Act, 21 U.S.C.section 360bbb-3(b)(1), unless the authorization is terminated  or revoked sooner.       Influenza A by PCR NEGATIVE NEGATIVE Final   Influenza B by PCR NEGATIVE NEGATIVE Final    Comment:  (NOTE) The Xpert Xpress SARS-CoV-2/FLU/RSV plus assay is intended as an aid in the diagnosis of influenza from Nasopharyngeal swab specimens and should not be used as a sole basis for treatment. Nasal washings and aspirates are unacceptable for Xpert Xpress SARS-CoV-2/FLU/RSV testing.  Fact Sheet for Patients: EntrepreneurPulse.com.au  Fact Sheet for Healthcare Providers: IncredibleEmployment.be  This test is not yet approved or cleared by the [place] FDA and has been authorized for detection and/or diagnosis  of SARS-CoV-2 by FDA under an Emergency Use Authorization (EUA). This EUA will remain in effect (meaning this test can be used) for the duration of the COVID-19 declaration under Section 564(b)(1) of the Act, 21 U.S.C. section 360bbb-3(b)(1), unless the authorization is terminated or revoked.  Performed at [facility], [place] [address], [place], [place] [address]   Urine Culture     Status: Abnormal   Collection Time: 10/26/20  6:00 PM   Specimen: Urine, Clean Catch  Result Value Ref Range Status   Specimen Description   Final    URINE, CLEAN CATCH Performed at [facility], [place] [address] [place], [place] [address]    Special Requests   Final    NONE Performed at [facility], [place] [address], [place], [place] [address]    Culture MULTIPLE SPECIES PRESENT, SUGGEST RECOLLECTION (A)  Final   Report Status 10/28/2020 FINAL  Final  Resp Panel by RT-PCR (Flu A&B, Covid) Nasopharyngeal Swab     Status: None   Collection Time: 10/31/20  9:10 AM   Specimen: Nasopharyngeal Swab; Nasopharyngeal(NP) swabs in vial transport medium  Result Value Ref Range Status   SARS Coronavirus 2 by RT PCR NEGATIVE NEGATIVE Final    Comment: (NOTE) SARS-CoV-2 target nucleic acids are NOT DETECTED.  The SARS-CoV-2 RNA is generally detectable in upper respiratory specimens during the acute phase  of infection. The lowest concentration of SARS-CoV-2 viral copies this assay can detect is 138 copies/mL. A negative result does not preclude SARS-Cov-2 infection and should not be used as the sole basis for treatment or other patient management decisions. A negative result may occur with  improper specimen collection/handling, submission of specimen other than nasopharyngeal swab, presence of viral mutation(s) within the areas targeted by this assay, and inadequate number of viral copies(<138 copies/mL). A negative result must be combined with clinical observations, patient history, and epidemiological information. The expected result is Negative.  Fact Sheet for Patients:  EntrepreneurPulse.com.au  Fact Sheet for Healthcare Providers:  IncredibleEmployment.be  This test is no t yet approved or cleared by the [place] FDA and  has been authorized for detection and/or diagnosis of SARS-CoV-2 by FDA under an Emergency Use Authorization (EUA). This EUA will remain  in effect (meaning this test can be used) for the duration of the COVID-19 declaration under Section 564(b)(1) of the Act, 21 U.S.C.section 360bbb-3(b)(1), unless the authorization is terminated  or revoked sooner.       Influenza A by PCR NEGATIVE NEGATIVE Final   Influenza B by PCR NEGATIVE NEGATIVE Final    Comment: (NOTE) The Xpert Xpress SARS-CoV-2/FLU/RSV plus assay is intended as an aid in the diagnosis of influenza from Nasopharyngeal swab specimens and should not be used as a sole basis for treatment. Nasal washings and aspirates are unacceptable for Xpert Xpress SARS-CoV-2/FLU/RSV testing.  Fact Sheet for Patients: EntrepreneurPulse.com.au  Fact Sheet for Healthcare Providers: IncredibleEmployment.be  This test is not yet approved or cleared by the [place] FDA and has been authorized for detection and/or diagnosis of  SARS-CoV-2 by FDA under an Emergency Use Authorization (EUA). This EUA will remain in effect (meaning this test can be used) for the duration of the COVID-19 declaration under Section 564(b)(1) of the Act, 21 U.S.C. section 360bbb-3(b)(1), unless the authorization is terminated or revoked.  Performed at [facility], [place] [address], [place], [place] [address]          Radiology Studies: DG Chest  1 View  Result Date: 10/30/2020 CLINICAL DATA:  Chest pain. EXAM: CHEST  1 VIEW COMPARISON:  October 24, 2020. FINDINGS: The heart size and mediastinal contours are within normal limits. Both lungs are clear. The visualized skeletal structures are unremarkable. IMPRESSION: No active disease. Electronically Signed   By: Marijo Conception M.D.   On: 10/30/2020 15:32   CT ABDOMEN PELVIS W CONTRAST  Result Date: 10/31/2020 CLINICAL DATA:  Right lower quadrant pain, nausea, vomiting and diarrhea. EXAM: CT ABDOMEN AND PELVIS WITH CONTRAST TECHNIQUE: Multidetector CT imaging of the abdomen and pelvis was performed using the standard protocol following bolus administration of intravenous contrast. CONTRAST:  100 mL OMNIPAQUE IOHEXOL 300 MG/ML  SOLN COMPARISON:  CT abdomen and pelvis 10/24/2020. FINDINGS: Lower chest: Lung bases clear.  No pleural or pericardial effusion. Hepatobiliary: No focal liver abnormality is seen. Status post cholecystectomy. No biliary dilatation. There is diffuse low-attenuation of the liver consistent with fatty infiltration. The liver measures approximately 25 cm craniocaudal. Pancreas: Unremarkable. No pancreatic ductal dilatation or surrounding inflammatory changes. Spleen: Normal in size without focal abnormality. Adrenals/Urinary Tract: The adrenal glands appear normal. Right renal cyst is unchanged. Staghorn calculus in the left kidney is again seen. The patient has multiple right renal stones. The largest measures 1.8 cm in diameter. Negative for hydronephrosis or ureteral  stone. The urinary bladder is unremarkable. Stomach/Bowel: Stomach is within normal limits. Appendix appears normal. No evidence of bowel wall thickening, distention, or inflammatory changes. Vascular/Lymphatic: No significant vascular findings are present. No enlarged abdominal or pelvic lymph nodes. Reproductive: Uterus and bilateral adnexa are unremarkable. Other: None. Musculoskeletal: No acute or focal abnormality. Multilevel lumbar degenerative change noted. IMPRESSION: No acute abnormality abdomen or pelvis. Hepatomegaly and fatty infiltration of the liver. Bilateral nonobstructing stones including a staghorn calculus on the left. Electronically Signed   By: Inge Rise M.D.   On: 10/31/2020 13:53        Scheduled Meds:  ascorbic acid  500 mg Oral Daily   budesonide (PULMICORT) nebulizer solution  0.25 mg Nebulization BID   buPROPion ER  200 mg Oral BID   busPIRone  15 mg Oral Daily   clotrimazole   Topical BID   enoxaparin (LOVENOX) injection  100 mg Subcutaneous Q24H   fluticasone  2 spray Each Nare BID   gabapentin  100 mg Oral TID   montelukast  10 mg Oral QHS   nystatin   Topical BID   rOPINIRole  4 mg Oral QHS   senna-docusate  2 tablet Oral Daily   sodium chloride flush  3 mL Intravenous Q12H   Continuous Infusions:  sodium chloride     ceFEPime (MAXIPIME) IV       LOS: 0 days    Time spent: 35 mins,More than 50% of that time was spent in counseling and/or coordination of care.      Shelly Coss, MD [facility] P7/27/2022, 8:18 AM

## 2020-11-02 DIAGNOSIS — F4310 Post-traumatic stress disorder, unspecified: Secondary | ICD-10-CM | POA: Diagnosis not present

## 2020-11-02 DIAGNOSIS — N390 Urinary tract infection, site not specified: Secondary | ICD-10-CM | POA: Diagnosis not present

## 2020-11-02 DIAGNOSIS — F329 Major depressive disorder, single episode, unspecified: Secondary | ICD-10-CM | POA: Diagnosis not present

## 2020-11-02 LAB — BASIC METABOLIC PANEL
Anion gap: 8 (ref 5–15)
BUN: 9 mg/dL (ref 6–20)
CO2: 25 mmol/L (ref 22–32)
Calcium: 8.8 mg/dL — ABNORMAL LOW (ref 8.9–10.3)
Chloride: 102 mmol/L (ref 98–111)
Creatinine, Ser: 1.46 mg/dL — ABNORMAL HIGH (ref 0.44–1.00)
GFR, Estimated: 46 mL/min — ABNORMAL LOW (ref >60)
Glucose, Bld: 96 mg/dL (ref 70–99)
Potassium: 3.1 mmol/L — ABNORMAL LOW (ref 3.5–5.1)
Sodium: 135 mmol/L (ref 135–145)

## 2020-11-02 MED ORDER — GABAPENTIN 100 MG PO CAPS: 200.0000 mg | ORAL_CAPSULE | Freq: Three times a day (TID) | ORAL | Status: DC

## 2020-11-02 MED ORDER — CLOTRIMAZOLE 1 % EX CREA: TOPICAL_CREAM | Freq: Two times a day (BID) | CUTANEOUS | 0 refills | Status: DC

## 2020-11-02 MED ORDER — POTASSIUM CHLORIDE CRYS ER 20 MEQ PO TBCR
60.0000 meq | EXTENDED_RELEASE_TABLET | Freq: Once | ORAL | Status: AC
  Administered 2020-11-02: 60 meq via ORAL
  Filled 2020-11-02: qty 3

## 2020-11-02 MED ORDER — TRAZODONE HCL 50 MG PO TABS: 50.0000 mg | ORAL_TABLET | Freq: Every day | ORAL | Status: DC

## 2020-11-02 MED ORDER — PANTOPRAZOLE SODIUM 40 MG PO TBEC
40.0000 mg | DELAYED_RELEASE_TABLET | Freq: Every day | ORAL | Status: DC
  Administered 2020-11-02 – 2021-01-05 (×65): 40 mg via ORAL
  Filled 2020-11-02 (×65): qty 1

## 2020-11-02 MED ORDER — TRAMADOL HCL 50 MG PO TABS: 50.0000 mg | ORAL_TABLET | Freq: Four times a day (QID) | ORAL | 0 refills | Status: DC | PRN
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
## 2020-11-02 NOTE — Discharge Summary (Addendum)
Physician Discharge Summary  Navdeep Volo Mikel [id number] DOB: 1975/05/21 DOA: 10/30/2020  PCP: Lucianne Lei, MD  Admit date: 10/30/2020 Discharge date: 11/06/2020  Admitted From: Home Disposition:  SNF  Discharge Condition:Stable CODE STATUS:FULL Diet recommendation:  Regular    Brief/Interim Summary:  Patient is a 45 year old female with history of anxiety/depression/bipolar disorder/she is affective disorder, pancreatitis, GERD, OSA not on CPAP, chronic back pain who presented with chest pain, palpitation.  She initially went to behavioral [facility] for suicidal thoughts but without any plan, developed chest pain there and sent to the emergency department.When she presented to the emergency department, her chest pain has resolved.  She also recently had a urine culture done at [facility] which showed multidrug-resistant organism and was advised to go to the hospital.  Patient also complained of right-sided flank pain for few days, suprapubic pain, with some nausea and vomiting .  Patient admitted of having suicidal ideation and also reported visual hallucination.  She is currently homeless.  Patient was admitted here for the management of multidrug-resistant UTI, suicidal ideation, homelessness.  Psychiatry/TOC consulted.  Psychiatry ruled out imminent risks to herself saying she is not suicidal.  Sitter discontinued.  PT recommended skilled nursing facility on discharge.  She is medically stable for discharge as soon as bed is available.  Following problems were addressed during hospitalization:   Multidrug-resistant UTI: Recently had urine culture done at [facility] which showed >100, 000 DFU of enterobacter cloacae complex and 10,000-50,000 DFU of corynebacterium.  Her urine culture on 5/25 also had shown Enterobacter cloacae which was multidrug-resistant. We suspect this is colonization.  Also reported nausea, vomiting, right CVA tenderness, suprapubic pain.  Had  leukocytosis on presentation.  She was initially started on Invanz, later changed to cefepime.  She will finish 3 days course of cefepime . CT abdomen showed 3 mm nonobstructive right renal calculi and left lower pole stones renal staghorn calculi measuring up to 5.3 cm.  No hydronephrosis. Currently she does not have any active signs of urine tract infection, she denies any dysuria, she does not have  leukocytosis Regarding management of her renal stones, she needs to follow-up with urology as an outpatient.   Suicidal ideation: Had a plan but no intent.  Currently denies any suicidal ideation.  Psychiatry consulted, not recommending inpatient psychiatric admission.  Sitter discontinued.  Recommended to continue Wellbutrin, gabapentin, trazodone   Chest pressure/pain: Complains of chest pressure.  Troponin 30 cycled, negative.  EKG did not show any ischemic changes.  Most likely this is associated with GERD or musculoskeletal etiology.    History of generalized anxiety disorder/depression: Psychiatry consulted.  Continue current medications  Panniculitis: Continue clotrimazole   History of asthma: Currently not in exacerbation.  On Singulair and home inhaler   CKD stage IIIa: Continue monitoring.  Currently kidney function at baseline   Hypokalemia: Supplemented with potassium.   OSA: Not on CPAP at home.  We recommend sleep study as an outpatient   Homelessness: Social worker consulted,PT recommended SNF   Hypotension: Likely her baseline blood pressure is on the lower side. BP soft but stable   Super morbid obesity: BMI of 75  Discharge Diagnoses:  Principal Problem:   Complicated UTI (urinary tract infection) Active Problems:   Asthma   PALPITATIONS, OCCASIONAL   Panniculitis   OSA (obstructive sleep apnea)   Generalized anxiety disorder   Major depression, chronic   Thrombocytosis   Hypokalemia   CKD (chronic kidney disease), stage III ([place])  Discharge  Instructions  Discharge Instructions     Diet general   Complete by: As directed    Discharge instructions   Complete by: As directed    1)Take prescribed medications as instructed 2)Follow up with urology as an outpatient for further management of  renal stone   Increase activity slowly   Complete by: As directed       Allergies as of 11/06/2020       Reactions   Amoxicillin Anaphylaxis   Bee Venom Shortness Of Breath   Penicillins Anaphylaxis   Tolerates cephalexin and ceftriaxone Has patient had a PCN reaction causing immediate rash, facial/tongue/throat swelling, SOB or lightheadedness with hypotension: No Has patient had a PCN reaction causing severe rash involving mucus membranes or skin necrosis: No Has patient had a PCN reaction that required hospitalization No Has patient had a PCN reaction occurring within the last 10 years: No If all of the above answers are "NO", then may proceed with Cephalosporin use.' REACTION: Angioedema   Penicillin G    Adhesive [tape] Rash   Latex Rash   Vancomycin Other (See Comments)   Pt can tolerate Vancomycin but did cause Vancomycin Infusion Reaction.  Recommend to pre-medicate with Benadryl before doses administered.          Medication List     STOP taking these medications    busPIRone 30 MG tablet Commonly known as: BUSPAR   diclofenac Sodium 1 % Gel Commonly known as: Voltaren   tamsulosin 0.4 MG Caps capsule Commonly known as: FLOMAX   Zinc Sulfate 220 (50 Zn) MG Tabs       TAKE these medications    acetaminophen 325 MG tablet Commonly known as: TYLENOL Take 650 mg by mouth every 6 (six) hours as needed for mild pain, fever or headache.   albuterol 108 (90 Base) MCG/ACT inhaler Commonly known as: VENTOLIN HFA INHALE 2 PUFFS INTO THE LUNGS EVERY 4 HOURS AS NEEDED FOR WHEEZING OR SHORTNESS OF BREATH   buPROPion ER 100 MG 12 hr tablet Commonly known as: Wellbutrin SR Take 2 tablets (200 mg total) by mouth 2  (two) times daily.   clotrimazole 1 % cream Commonly known as: LOTRIMIN Apply topically 2 (two) times daily.   Flovent HFA 110 MCG/ACT inhaler Generic drug: fluticasone Inhale 2 puffs into the lungs 2 (two) times daily.   fluticasone 50 MCG/ACT nasal spray Commonly known as: FLONASE Place 2 sprays into both nostrils 2 (two) times daily.   gabapentin 100 MG capsule Commonly known as: NEURONTIN Take 2 capsules (200 mg total) by mouth 3 (three) times daily. What changed: how much to take   methocarbamol 500 MG tablet Commonly known as: ROBAXIN Take 1 tablet (500 mg total) by mouth 2 (two) times daily as needed for muscle spasms.   montelukast 10 MG tablet Commonly known as: SINGULAIR Take 1 tablet (10 mg total) by mouth at bedtime.   rOPINIRole 4 MG tablet Commonly known as: REQUIP Take 4 mg by mouth at bedtime.   Senexon-S 8.6-50 MG tablet Generic drug: senna-docusate Take 2 tablets by mouth daily.   traMADol 50 MG tablet Commonly known as: ULTRAM Take 1 tablet (50 mg total) by mouth every 6 (six) hours as needed for severe pain.   traZODone 50 MG tablet Commonly known as: DESYREL Take 1 tablet (50 mg total) by mouth at bedtime. What changed: how much to take   Vitamin B-12 5000 MCG Tbdp Take 5,000 mcg by mouth daily.  vitamin C 500 MG tablet Commonly known as: ASCORBIC ACID Take 1 tablet (500 mg total) by mouth daily.         Allergies  Allergen Reactions   Amoxicillin Anaphylaxis   Bee Venom Shortness Of Breath   Penicillins Anaphylaxis    Tolerates cephalexin and ceftriaxone Has patient had a PCN reaction causing immediate rash, facial/tongue/throat swelling, SOB or lightheadedness with hypotension: No Has patient had a PCN reaction causing severe rash involving mucus membranes or skin necrosis: No Has patient had a PCN reaction that required hospitalization No Has patient had a PCN reaction occurring within the last 10 years: No If all of the above  answers are "NO", then may proceed with Cephalosporin use.'  REACTION: Angioedema   Penicillin G    Adhesive [Tape] Rash   Latex Rash   Vancomycin Other (See Comments)    Pt can tolerate Vancomycin but did cause Vancomycin Infusion Reaction.  Recommend to pre-medicate with Benadryl before doses administered.      Consultations: Psychiatry   Procedures/Studies: CT ABDOMEN PELVIS WO CONTRAST  Result Date: 10/24/2020 CLINICAL DATA:  Abdominal pain, nausea/vomiting EXAM: CT ABDOMEN AND PELVIS WITHOUT CONTRAST TECHNIQUE: Multidetector CT imaging of the abdomen and pelvis was performed following the standard protocol without IV contrast. COMPARISON:  08/18/2020 FINDINGS: Lower chest: Lung bases are clear. Hepatobiliary: Severe hepatic steatosis. Status post cholecystectomy. No intrahepatic or extrahepatic ductal dilatation. Pancreas: Within normal limits. Spleen: Within normal limits. Adrenals/Urinary Tract: Adrenal glands are within normal limits. 8.3 cm right upper pole renal cyst, measuring simple fluid density, likely benign. Three nonobstructing right renal calculi, measuring up to 1.9 cm in the interpolar region (coronal image 34). Left lower pole renal staghorn calculi measuring up to 5.3 cm (coronal image 85). No hydronephrosis. Bladder is within normal limits. Stomach/Bowel: Stomach is notable for a tiny hiatal hernia. No evidence of bowel obstruction. Normal appendix (series 3/image 60). No colonic wall thickening or inflammatory changes. Vascular/Lymphatic: No evidence of abdominal aortic aneurysm. No suspicious abdominopelvic lymphadenopathy. Reproductive: Uterus is within normal limits. Bilateral ovaries are within normal limits. Other: No abdominopelvic ascites. Musculoskeletal: Grade 1 spondylolisthesis at L5-S1. Degenerative changes of the visualized thoracolumbar spine. IMPRESSION: Bilateral nonobstructing renal calculi, including a 5.3 cm left renal staghorn calculus. No hydronephrosis.  Severe hepatic steatosis. Additional stable ancillary findings as above. Electronically Signed   By: Julian Hy M.D.   On: 10/24/2020 19:43   DG Chest 1 View  Result Date: 10/30/2020 CLINICAL DATA:  Chest pain. EXAM: CHEST  1 VIEW COMPARISON:  October 24, 2020. FINDINGS: The heart size and mediastinal contours are within normal limits. Both lungs are clear. The visualized skeletal structures are unremarkable. IMPRESSION: No active disease. Electronically Signed   By: Marijo Conception M.D.   On: 10/30/2020 15:32   DG Chest 2 View  Result Date: 10/24/2020 CLINICAL DATA:  Chest tightness for 3 days. EXAM: CHEST - 2 VIEW COMPARISON:  Single-view of the chest 08/18/2020. PA and lateral chest 06/22/2018. FINDINGS: Lungs clear. Heart size normal. No pneumothorax or pleural fluid. No acute or focal bony abnormality. IMPRESSION: Negative chest. Electronically Signed   By: Inge Rise M.D.   On: 10/24/2020 14:51   CT ABDOMEN PELVIS W CONTRAST  Result Date: 10/31/2020 CLINICAL DATA:  Right lower quadrant pain, nausea, vomiting and diarrhea. EXAM: CT ABDOMEN AND PELVIS WITH CONTRAST TECHNIQUE: Multidetector CT imaging of the abdomen and pelvis was performed using the standard protocol following bolus administration of intravenous contrast. CONTRAST:  100 mL OMNIPAQUE IOHEXOL 300 MG/ML  SOLN COMPARISON:  CT abdomen and pelvis 10/24/2020. FINDINGS: Lower chest: Lung bases clear.  No pleural or pericardial effusion. Hepatobiliary: No focal liver abnormality is seen. Status post cholecystectomy. No biliary dilatation. There is diffuse low-attenuation of the liver consistent with fatty infiltration. The liver measures approximately 25 cm craniocaudal. Pancreas: Unremarkable. No pancreatic ductal dilatation or surrounding inflammatory changes. Spleen: Normal in size without focal abnormality. Adrenals/Urinary Tract: The adrenal glands appear normal. Right renal cyst is unchanged. Staghorn calculus in the left  kidney is again seen. The patient has multiple right renal stones. The largest measures 1.8 cm in diameter. Negative for hydronephrosis or ureteral stone. The urinary bladder is unremarkable. Stomach/Bowel: Stomach is within normal limits. Appendix appears normal. No evidence of bowel wall thickening, distention, or inflammatory changes. Vascular/Lymphatic: No significant vascular findings are present. No enlarged abdominal or pelvic lymph nodes. Reproductive: Uterus and bilateral adnexa are unremarkable. Other: None. Musculoskeletal: No acute or focal abnormality. Multilevel lumbar degenerative change noted. IMPRESSION: No acute abnormality abdomen or pelvis. Hepatomegaly and fatty infiltration of the liver. Bilateral nonobstructing stones including a staghorn calculus on the left. Electronically Signed   By: Inge Rise M.D.   On: 10/31/2020 13:53      Subjective:  Patient seen and examined at the bedside this morning.  Hemodynamically stable.  She was working with PT/OT.  Medically stable for discharge  Discharge Exam: Vitals:   11/06/20 0531 11/06/20 1015  BP: (!) 105/59 (!) 107/56  Pulse: 72 76  Resp: 18 19  Temp: 97.8 F (36.6 C) 98.3 F (36.8 C)  SpO2: 98% 96%   Vitals:   11/05/20 1737 11/05/20 2107 11/06/20 0531 11/06/20 1015  BP: 105/65 (!) 94/53 (!) 105/59 (!) 107/56  Pulse: 84 88 72 76  Resp: '20 18 18 19  '$ Temp: 99 F (37.2 C) 98 F (36.7 C) 97.8 F (36.6 C) 98.3 F (36.8 C)  TempSrc:  Oral Oral Oral  SpO2: 97% 98% 98% 96%  Weight:      Height:        General: Pt is alert, awake, not in acute distress, morbid obesity Cardiovascular: RRR, S1/S2 +, no rubs, no gallops Respiratory: CTA bilaterally, no wheezing, no rhonchi Abdominal: Soft, NT, ND, bowel sounds + Extremities: no edema, no cyanosis    The results of significant diagnostics from this hospitalization (including imaging, microbiology, ancillary and laboratory) are listed below for reference.      Microbiology: Recent Results (from the past 240 hour(s))  Resp Panel by RT-PCR (Flu A&B, Covid) Nasopharyngeal Swab     Status: None   Collection Time: 10/31/20  9:10 AM   Specimen: Nasopharyngeal Swab; Nasopharyngeal(NP) swabs in vial transport medium  Result Value Ref Range Status   SARS Coronavirus 2 by RT PCR NEGATIVE NEGATIVE Final    Comment: (NOTE) SARS-CoV-2 target nucleic acids are NOT DETECTED.  The SARS-CoV-2 RNA is generally detectable in upper respiratory specimens during the acute phase of infection. The lowest concentration of SARS-CoV-2 viral copies this assay can detect is 138 copies/mL. A negative result does not preclude SARS-Cov-2 infection and should not be used as the sole basis for treatment or other patient management decisions. A negative result may occur with  improper specimen collection/handling, submission of specimen other than nasopharyngeal swab, presence of viral mutation(s) within the areas targeted by this assay, and inadequate number of viral copies(<138 copies/mL). A negative result must be combined with clinical observations, patient history, and epidemiological  information. The expected result is Negative.  Fact Sheet for Patients:  EntrepreneurPulse.com.au  Fact Sheet for Healthcare Providers:  IncredibleEmployment.be  This test is no t yet approved or cleared by the [place] FDA and  has been authorized for detection and/or diagnosis of SARS-CoV-2 by FDA under an Emergency Use Authorization (EUA). This EUA will remain  in effect (meaning this test can be used) for the duration of the COVID-19 declaration under Section 564(b)(1) of the Act, 21 U.S.C.section 360bbb-3(b)(1), unless the authorization is terminated  or revoked sooner.       Influenza A by PCR NEGATIVE NEGATIVE Final   Influenza B by PCR NEGATIVE NEGATIVE Final    Comment: (NOTE) The Xpert Xpress SARS-CoV-2/FLU/RSV plus assay is  intended as an aid in the diagnosis of influenza from Nasopharyngeal swab specimens and should not be used as a sole basis for treatment. Nasal washings and aspirates are unacceptable for Xpert Xpress SARS-CoV-2/FLU/RSV testing.  Fact Sheet for Patients: EntrepreneurPulse.com.au  Fact Sheet for Healthcare Providers: IncredibleEmployment.be  This test is not yet approved or cleared by the [place] FDA and has been authorized for detection and/or diagnosis of SARS-CoV-2 by FDA under an Emergency Use Authorization (EUA). This EUA will remain in effect (meaning this test can be used) for the duration of the COVID-19 declaration under Section 564(b)(1) of the Act, 21 U.S.C. section 360bbb-3(b)(1), unless the authorization is terminated or revoked.  Performed at [facility], [place] [address] [place], [place] [address]      Labs: BNP (last 3 results) No results for input(s): BNP in the last 8760 hours. Basic Metabolic Panel: Recent Labs  Lab 10/30/20 1414 10/31/20 1908 11/01/20 0350 11/02/20 0321 11/03/20 0950 11/05/20 0217  NA 137  --  135 135 137 137  K 3.2*  --  3.3* 3.1* 4.1 4.0  CL 102  --  102 102 104 103  CO2 25  --  '27 25 24 28  '$ GLUCOSE 98  --  83 96 89 84  BUN 8  --  '13 9 8 9  '$ CREATININE 1.42*  --  1.57* 1.46* 1.24* 1.21*  CALCIUM 9.2  --  8.9 8.8* 9.4 8.6*  MG  --  2.1  --   --   --   --    Liver Function Tests: Recent Labs  Lab 10/31/20 1330  AST 30  ALT 23  ALKPHOS 53  BILITOT 0.4  PROT 7.3  ALBUMIN 3.1*   No results for input(s): LIPASE, AMYLASE in the last 168 hours. No results for input(s): AMMONIA in the last 168 hours. CBC: Recent Labs  Lab 10/30/20 1414 11/01/20 0350 11/05/20 0217  WBC 12.1* 8.0 7.3  NEUTROABS  --   --  4.0  HGB 12.4 9.8* 10.1*  HCT 42.7 33.2* 34.8*  MCV 88.4 86.5 87.2  PLT 550* 428* 411*   Cardiac Enzymes: No results for input(s): CKTOTAL, CKMB, CKMBINDEX, TROPONINI in  the last 168 hours. BNP: Invalid input(s): POCBNP CBG: No results for input(s): GLUCAP in the last 168 hours. D-Dimer No results for input(s): DDIMER in the last 72 hours. Hgb A1c No results for input(s): HGBA1C in the last 72 hours. Lipid Profile No results for input(s): CHOL, HDL, LDLCALC, TRIG, CHOLHDL, LDLDIRECT in the last 72 hours. Thyroid function studies No results for input(s): TSH, T4TOTAL, T3FREE, THYROIDAB in the last 72 hours.  Invalid input(s): FREET3 Anemia work up No results for input(s): VITAMINB12, FOLATE, FERRITIN, TIBC, IRON, RETICCTPCT in the  last 72 hours. Urinalysis    Component Value Date/Time   COLORURINE AMBER (A) 10/31/2020 1534   APPEARANCEUR CLOUDY (A) 10/31/2020 1534   APPEARANCEUR Cloudy (A) 08/06/2016 1209   LABSPEC 1.028 10/31/2020 1534   PHURINE 6.0 10/31/2020 1534   GLUCOSEU NEGATIVE 10/31/2020 1534   HGBUR MODERATE (A) 10/31/2020 1534   HGBUR large 04/19/2008 0952   BILIRUBINUR NEGATIVE 10/31/2020 1534   BILIRUBINUR Negative 08/06/2016 1209   KETONESUR NEGATIVE 10/31/2020 1534   PROTEINUR 30 (A) 10/31/2020 1534   UROBILINOGEN 0.2 12/06/2014 2049   NITRITE POSITIVE (A) 10/31/2020 1534   LEUKOCYTESUR LARGE (A) 10/31/2020 1534   Sepsis Labs Invalid input(s): PROCALCITONIN,  WBC,  LACTICIDVEN Microbiology Recent Results (from the past 240 hour(s))  Resp Panel by RT-PCR (Flu A&B, Covid) Nasopharyngeal Swab     Status: None   Collection Time: 10/31/20  9:10 AM   Specimen: Nasopharyngeal Swab; Nasopharyngeal(NP) swabs in vial transport medium  Result Value Ref Range Status   SARS Coronavirus 2 by RT PCR NEGATIVE NEGATIVE Final    Comment: (NOTE) SARS-CoV-2 target nucleic acids are NOT DETECTED.  The SARS-CoV-2 RNA is generally detectable in upper respiratory specimens during the acute phase of infection. The lowest concentration of SARS-CoV-2 viral copies this assay can detect is 138 copies/mL. A negative result does not preclude  SARS-Cov-2 infection and should not be used as the sole basis for treatment or other patient management decisions. A negative result may occur with  improper specimen collection/handling, submission of specimen other than nasopharyngeal swab, presence of viral mutation(s) within the areas targeted by this assay, and inadequate number of viral copies(<138 copies/mL). A negative result must be combined with clinical observations, patient history, and epidemiological information. The expected result is Negative.  Fact Sheet for Patients:  EntrepreneurPulse.com.au  Fact Sheet for Healthcare Providers:  IncredibleEmployment.be  This test is no t yet approved or cleared by the [place] FDA and  has been authorized for detection and/or diagnosis of SARS-CoV-2 by FDA under an Emergency Use Authorization (EUA). This EUA will remain  in effect (meaning this test can be used) for the duration of the COVID-19 declaration under Section 564(b)(1) of the Act, 21 U.S.C.section 360bbb-3(b)(1), unless the authorization is terminated  or revoked sooner.       Influenza A by PCR NEGATIVE NEGATIVE Final   Influenza B by PCR NEGATIVE NEGATIVE Final    Comment: (NOTE) The Xpert Xpress SARS-CoV-2/FLU/RSV plus assay is intended as an aid in the diagnosis of influenza from Nasopharyngeal swab specimens and should not be used as a sole basis for treatment. Nasal washings and aspirates are unacceptable for Xpert Xpress SARS-CoV-2/FLU/RSV testing.  Fact Sheet for Patients: EntrepreneurPulse.com.au  Fact Sheet for Healthcare Providers: IncredibleEmployment.be  This test is not yet approved or cleared by the [place] FDA and has been authorized for detection and/or diagnosis of SARS-CoV-2 by FDA under an Emergency Use Authorization (EUA). This EUA will remain in effect (meaning this test can be used) for the duration of  the COVID-19 declaration under Section 564(b)(1) of the Act, 21 U.S.C. section 360bbb-3(b)(1), unless the authorization is terminated or revoked.  Performed at [facility], [place] [address], [place], [place] [address]     Please note: You were cared for by a hospitalist during your hospital stay. Once you are discharged, your primary care physician will handle any further medical issues. Please note that NO REFILLS for any discharge medications will be authorized once you are discharged, as  it is imperative that you return to your primary care physician (or establish a relationship with a primary care physician if you do not have one) for your post hospital discharge needs so that they can reassess your need for medications and monitor your lab values.    Time coordinating discharge: 40 minutes  SIGNED:   Shelly Coss, MD  [facility] 11/06/2020, 1:22 PM Pager [phone]  If 7PM-7AM, please contact night-coverage [email or web address] Password [email or web address]

## 2020-11-02 NOTE — Plan of Care (Signed)
  Problem: Health Behavior/Discharge Planning: Goal: Ability to manage health-related needs will improve Outcome: Progressing   

## 2020-11-02 NOTE — Evaluation (Signed)
Physical Therapy Evaluation Patient Details Name: Heather [name] MRN: [id number] DOB: 10/25/1975 Today's Date: 11/02/2020   History of Present Illness  45 y.o. female presented to ED for chest pain and palpitations and feeling like her throat was closing while at her behavioral health facility for SI. Admitted 10/31/20 for treatment of complicated UTI PMH: anxiety and depression (hx of bipolar and schizoaffective disorder in chart in past), ACD, panniculitis, debility,GERD,  OSA not on cpap, chronic back pain.  Clinical Impression  Timeline difficult to follow, PTA pt living with mother in hotel where she took bird baths and was able to ambulate short distances with RW. Pt reports recent fall onto R knee and has had difficulty with ambulation since then. Pt is currently limited in safe mobility by R knee and back pain especially in upright, body habitus and decreased strength. Pt requires mod A for sitting EoB, maxAx2 for standing in RW, unable to take steps due to pain and buckling in R knee. Pt requires total A for return to bed. PT recommending SNF level rehab to regain independence in ambulation. PT will continue to follow acutely.     Follow Up Recommendations SNF    Equipment Recommendations  None recommended by PT (has RW, left BSC at hotel)    Recommendations for Other Services OT consult     Precautions / Restrictions Precautions Precautions: Fall Precaution Comments: fall prior to hospitalization Restrictions Weight Bearing Restrictions: No      Mobility  Bed Mobility Overal bed mobility: Needs Assistance Bed Mobility: Supine to Sit;Sit to Supine     Supine to sit: Mod assist;HOB elevated;+2 for physical assistance Sit to supine: Total assist;+2 for physical assistance   General bed mobility comments: bed deflated to improve mobility, modA for coming to EoB, cuing for hand placement and scooting to EoB, total A for return of LE to bed    Transfers Overall  transfer level: Needs assistance Equipment used: Rolling walker (2 wheeled) Transfers: Sit to/from Stand Sit to Stand: Max assist;+2 physical assistance         General transfer comment: 2 attempts with maxAx2 required to achieve upright at RW, pt with flexed forward posture unable to stand errect due to pain  Ambulation/Gait Ambulation/Gait assistance: Total assist           General Gait Details: attempted to take lateral steps towards the HoB, however unable to bear weight on R LE due to R knee pain and buckling        Balance Overall balance assessment: Needs assistance Sitting-balance support: Feet supported;No upper extremity supported Sitting balance-Leahy Scale: Fair     Standing balance support: Bilateral upper extremity supported;During functional activity Standing balance-Leahy Scale: Poor Standing balance comment: requires B UE assist to maintain standing                             Pertinent Vitals/Pain Pain Assessment: Faces Faces Pain Scale: Hurts even more Pain Location: back and R knee Pain Descriptors / Indicators: Aching;Sharp;Shooting;Sore Pain Intervention(s): Limited activity within patient's tolerance;Monitored during session;Repositioned    Home Living Family/patient expects to be discharged to:: Skilled nursing facility                      Prior Function Level of Independence: Needs assistance   Gait / Transfers Assistance Needed: reports ambulating short distances with RW with increasing difficult fell on R knee prior to hospitalization  ADL's / Homemaking Assistance Needed: pt takes birdbaths in hotel room        Hand Dominance   Dominant Hand: Left    Extremity/Trunk Assessment   Upper Extremity Assessment Upper Extremity Assessment: Generalized weakness    Lower Extremity Assessment Lower Extremity Assessment: RLE deficits/detail;LLE deficits/detail RLE Deficits / Details: ROM limited by body habitus, R  knee pain and buckling in standing LLE Deficits / Details: ROM limited by body habitus, strength grossly 4/5       Communication   Communication: No difficulties  Cognition Arousal/Alertness: Awake/alert Behavior During Therapy: WFL for tasks assessed/performed Overall Cognitive Status: Impaired/Different from baseline Area of Impairment: Safety/judgement;Problem solving                         Safety/Judgement: Decreased awareness of safety;Decreased awareness of deficits   Problem Solving: Requires verbal cues;Requires tactile cues General Comments: decreased awareness of her current level of function      General Comments General comments (skin integrity, edema, etc.): VSS on RA        Assessment/Plan    PT Assessment Patient needs continued PT services  PT Problem List Decreased strength;Decreased range of motion;Decreased activity tolerance;Decreased balance;Obesity;Decreased skin integrity;Decreased mobility;Pain       PT Treatment Interventions DME instruction;Gait training;Functional mobility training;Therapeutic activities;Therapeutic exercise;Balance training;Cognitive remediation;Patient/family education    PT Goals (Current goals can be found in the Care Plan section)  Acute Rehab PT Goals Patient Stated Goal: be able to get around on her own PT Goal Formulation: With patient Time For Goal Achievement: 11/16/20 Potential to Achieve Goals: Fair    Frequency Min 2X/week    AM-PAC PT "6 Clicks" Mobility  Outcome Measure Help needed turning from your back to your side while in a flat bed without using bedrails?: None Help needed moving from lying on your back to sitting on the side of a flat bed without using bedrails?: A Lot Help needed moving to and from a bed to a chair (including a wheelchair)?: Total Help needed standing up from a chair using your arms (e.g., wheelchair or bedside chair)?: Total Help needed to walk in hospital room?:  Total Help needed climbing 3-5 steps with a railing? : Total 6 Click Score: 10    End of Session   Activity Tolerance: Patient limited by pain Patient left: in bed;with call bell/phone within reach;with nursing/sitter in room;with bed alarm set Nurse Communication: Mobility status PT Visit Diagnosis: Unsteadiness on feet (R26.81);Other abnormalities of gait and mobility (R26.89);History of falling (Z91.81);Muscle weakness (generalized) (M62.81);Difficulty in walking, not elsewhere classified (R26.2);Pain Pain - Right/Left: Right Pain - part of body: Knee (back)    Time: [id number] PT Time Calculation (min) (ACUTE ONLY): 32 min   Charges:   PT Evaluation $PT Eval Moderate Complexity: 1 Mod PT Treatments $Therapeutic Activity: 8-22 mins        Milanie Rosenfield B. Migdalia Dk PT, DPT Acute Rehabilitation Services Pager [phone] Office [phone]   [place] 11/02/2020, 10:52 AM

## 2020-11-02 NOTE — Progress Notes (Signed)
Nutrition Brief Note  RD consulted to assess patient's nutritional status/requirements.   Admitting Dx: Complicated UTI (urinary tract infection) [N39.0] Chest pain [R07.9] Urinary tract infection without hematuria, site unspecified [N39.0] PMH:  Past Medical History:  Diagnosis Date   Allergy    Amenorrhea    Anemia    post partum    Anxiety    Anxiety    Arthritis    Asthma    Back pain    Constipation    COPD (chronic obstructive pulmonary disease) ([place])    Delivery with history of C-section    Depression    Depression    Diabetes mellitus without complication ([place])    patient denies but states she has hyperglycemia-diet controlled   Dysmenorrhea    Dysrhythmia    DR Johnsie Cancel     Ectopic pregnancy 2013   Edema, lower extremity    Eosinophilic esophagitis    Diagnosed at [facility]/16/[facility], untreated   Gallbladder problem    GERD (gastroesophageal reflux disease)    HEARTBURN   TUMS   Hard to intubate 11/07/2015   High cholesterol    IBS (irritable bowel syndrome)    Leukocytosis 07/28/2008   Qualifier: Diagnosis of  By: Jonna Munro MD, Cornelius     Morbid obesity [facility])    Neuromuscular disorder ([place])    RESTLESS LEG    Obesity    Schizoaffective disorder, bipolar type ([place])    Sepsis ([place]) 11/11/2014   Shortness of breath    WITH EXERTION    Sleep apnea    CPAP- in process of restarting    Medications:  Scheduled Meds:  ascorbic acid  500 mg Oral Daily   budesonide (PULMICORT) nebulizer solution  0.25 mg Nebulization BID   buPROPion ER  200 mg Oral BID   clotrimazole   Topical BID   enoxaparin (LOVENOX) injection  100 mg Subcutaneous Q24H   fluticasone  2 spray Each Nare BID   gabapentin  200 mg Oral TID   montelukast  10 mg Oral QHS   nystatin   Topical BID   pantoprazole  40 mg Oral Daily   rOPINIRole  4 mg Oral QHS   senna-docusate  2 tablet Oral Daily   sodium chloride flush  3 mL Intravenous Q12H   traZODone  50 mg Oral QHS   Continuous Infusions:   sodium chloride     ceFEPime (MAXIPIME) IV 2 g (11/02/20 0903)   Labs: Recent Labs  Lab 10/30/20 1414 10/31/20 1908 11/01/20 0350 11/02/20 0321  NA 137  --  135 135  K 3.2*  --  3.3* 3.1*  CL 102  --  102 102  CO2 25  --  27 25  BUN 8  --  13 9  CREATININE 1.42*  --  1.57* 1.46*  CALCIUM 9.2  --  8.9 8.8*  MG  --  2.1  --   --   GLUCOSE 98  --  83 96    Wt Readings from Last 15 Encounters:  11/01/20 (!) 193.2 kg  10/26/20 (!) 203.7 kg  09/05/20 (!) 203.8 kg  08/22/20 (!) 234.5 kg  08/18/20 (!) 236.3 kg  03/17/19 (!) 204.1 kg  09/24/18 (!) 204.1 kg  08/13/18 (!) 192.3 kg  08/10/18 (!) 192.3 kg  08/03/18 (!) 192.3 kg  06/22/18 (!) 192.3 kg  06/11/18 (!) 192.3 kg  06/11/18 (!) 192.8 kg  02/03/18 (!) 203.7 kg  07/13/17 (!) 196 kg    Body mass index is 75.46 kg/m.  Patient meets criteria for morbid obesity based on current BMI.   Current diet order is carb modified, patient is consuming approximately 75-100% of meals at this time.   Per MD, pt is medically stable for discharge and is pending bed availability. Discharge orders have been signed.   No nutrition interventions warranted at this time. If nutrition issues arise, please consult RD.   Larkin Ina, MS, RD, LDN (she/her/hers) RD pager number and weekend/on-call pager number located in [place].

## 2020-11-03 LAB — BASIC METABOLIC PANEL
Anion gap: 9 (ref 5–15)
BUN: 8 mg/dL (ref 6–20)
CO2: 24 mmol/L (ref 22–32)
Calcium: 9.4 mg/dL (ref 8.9–10.3)
Chloride: 104 mmol/L (ref 98–111)
Creatinine, Ser: 1.24 mg/dL — ABNORMAL HIGH (ref 0.44–1.00)
GFR, Estimated: 56 mL/min — ABNORMAL LOW (ref >60)
Glucose, Bld: 89 mg/dL (ref 70–99)
Potassium: 4.1 mmol/L (ref 3.5–5.1)
Sodium: 137 mmol/L (ref 135–145)

## 2020-11-03 NOTE — Clinical Social Work Note (Signed)
[facility]  RE: Heather [name] Date of Birth: 12-14-1975 Date: 11/03/20  To Whom It May Concern:  Please be advised that the above-named patient will require a short-term nursing home stay - anticipated 30 days or less for rehabilitation and  Strengthening. The plan is for return home.

## 2020-11-03 NOTE — Therapy (Signed)
Occupational Therapy Evaluation Patient Details Name: Heather [name] MRN: [id number] DOB: January 16, 1976 Today's Date: 11/03/2020    History of Present Illness 45 y.o. female presented to ED for chest pain and palpitations and feeling like her throat was closing while at her behavioral health facility for SI. Admitted 10/31/20 for treatment of complicated UTI PMH: anxiety and depression (hx of bipolar and schizoaffective disorder in chart in past), ACD, panniculitis, debility,GERD,  OSA not on cpap, chronic back pain.   Clinical Impression   Pt admitted as above with deficits as stated below. Pt is currently limited in safe mobility secondary to pain in abdomen and back, as well as, body habitus and overall generalized weakness. Pt demonstrated improved bed mobility today, requiring Min A +1 for supine to sit EOB. Pt participated in ADL retraining session for self care. Discussed need for increased participation with daily activities in preparation for Rehab/SNF. Pt verbalized understanding. Pt declined OOB activity stating previous fall w/ R knee pain. Pt was able to complete grooming sitting up EOB and was Min A LE's +1 getting back to bed. Pt was able to reposition herself using bed rails and increased time. Will follow acutely.   Follow Up Recommendations  SNF;Supervision/Assistance - 24 hour    Equipment Recommendations  Other (comment) (Defer to next venue)    Recommendations for Other Services Other (comment) (SNF)     Precautions / Restrictions Precautions Precautions: Fall Precaution Comments: fall prior to hospitalization Restrictions Weight Bearing Restrictions: No      Mobility Bed Mobility Overal bed mobility: Needs Assistance Bed Mobility: Supine to Sit;Sit to Supine     Supine to sit: Min assist;HOB elevated (+1) Sit to supine: Min assist (+1, increased time and Min A to LE's getting back on bed after sitting EOB for ADL's.)   General bed mobility comments: Pt  required decreased assist today, given increased time. Pt used bed rails to reposition and scoot up to [facility].    Transfers Overall transfer level: Needs assistance Equipment used: Rolling walker (2 wheeled)             General transfer comment: Pt declined OOB this date stating back pain and abdomen pain. Pt noted to use bilateral LE's during bed mobility nad for repositioning w/o c/o pain in R knee.    Balance Overall balance assessment: Needs assistance Sitting-balance support: Feet supported;No upper extremity supported Sitting balance-Leahy Scale: Fair           ADL either performed or assessed with clinical judgement   ADL Overall ADL's : Needs assistance/impaired Eating/Feeding: Independent;Bed level   Grooming: Wash/dry hands;Wash/dry face;Brushing hair;Set up;Sitting (Sitting up at EOB)   Upper Body Bathing: Minimal assistance;Moderate assistance;Sitting (Sitting up at EOB)   Lower Body Bathing: Maximal assistance;+2 for physical assistance;Bed level   Upper Body Dressing : Minimal assistance;Moderate assistance;Sitting   Lower Body Dressing: Maximal assistance;Total assistance;+2 for physical assistance;+2 for safety/equipment;Bed level   Toilet Transfer: +2 for physical assistance;+2 for safety/equipment;Maximal assistance;Requires wide/bariatric (Pt declined OOB secondary to R knee pain, however she was able to use R knee w/o c/o pain for bed mobility.)   Toileting- Clothing Manipulation and Hygiene: Total assistance;+2 for physical assistance;+2 for safety/equipment;Sit to/from stand;Bed level       Functional mobility during ADLs: Maximal assistance;+2 for physical assistance;+2 for safety/equipment;Rolling walker (Max +2 secondary to knee pain R LE) General ADL Comments: Pt demonstrated bed mobility with Min A supine to sit at EOB for ADL's followed by Min A for  LLE when transferring from sit to supine. Pt is motivated for therapy and prefers CIR if possible.  "They were worried about me being able to tolerate therapy there last time, but I did it. I know I can do it" (Pt educated verbally that OT is recommending Rehab/SNF but this will depend on bed availability. Pt verbalized understanding of this.)    Pertinent Vitals/Pain Pain Assessment: 0-10 Pain Score: 7  Pain Location: Back and abdomen Pain Descriptors / Indicators: Aching;Discomfort Pain Intervention(s): Limited activity within patient's tolerance;Monitored during session;Repositioned     Hand Dominance Left   Extremity/Trunk Assessment Upper Extremity Assessment Upper Extremity Assessment: Generalized weakness;Overall WFL for tasks assessed   Lower Extremity Assessment Lower Extremity Assessment: Defer to PT evaluation RLE Deficits / Details: ROM limited by body habitus, R knee pain and buckling in standing (Per PT evaluation) LLE Deficits / Details: ROM limited by body habitus, strength grossly 4/5 (Per PT evaluation)       Communication Communication Communication: No difficulties   Cognition Arousal/Alertness: Awake/alert Behavior During Therapy: WFL for tasks assessed/performed Overall Cognitive Status: Impaired/Different from baseline Area of Impairment: Safety/judgement;Problem solving       Safety/Judgement: Decreased awareness of safety;Decreased awareness of deficits   Problem Solving: Requires verbal cues;Requires tactile cues General Comments: decreased awareness of her current level of function              Home Living Family/patient expects to be discharged to:: Skilled nursing facility       Additional Comments: per pt, her hotel bathroom should be accessible while using the bariatric RW      Prior Functioning/Environment Level of Independence: Needs assistance  Gait / Transfers Assistance Needed: reports ambulating short distances with RW with increasing difficult fell on R knee prior to hospitalization ADL's / Homemaking Assistance Needed: Pt  reports sponge bathing at sink in hotel room       OT Problem List: Decreased strength;Decreased activity tolerance;Impaired balance (sitting and/or standing);Decreased knowledge of use of DME or AE;Obesity;Pain      OT Treatment/Interventions: Self-care/ADL training;Therapeutic exercise;Energy conservation;DME and/or AE instruction;Therapeutic activities;Patient/family education    OT Goals(Current goals can be found in the care plan section) Acute Rehab OT Goals Patient Stated Goal: Go to Rehab OT Goal Formulation: With patient Time For Goal Achievement: 11/24/20 Potential to Achieve Goals: Fair ADL Goals Pt Will Perform Grooming: with set-up;sitting Pt Will Perform Lower Body Bathing: with set-up;with min guard assist;sitting/lateral leans;sit to/from stand;with adaptive equipment Pt Will Perform Lower Body Dressing: with min guard assist;with adaptive equipment;sitting/lateral leans;sit to/from stand Pt Will Transfer to Toilet: with supervision;bedside commode;stand pivot transfer;ambulating Pt Will Perform Toileting - Clothing Manipulation and hygiene: with supervision;with min guard assist;sitting/lateral leans;sit to/from stand;with adaptive equipment Pt Will Perform Tub/Shower Transfer: Tub transfer;with min guard assist;tub bench;rolling walker  OT Frequency: Min 2X/week   Barriers to D/C: Other (comment) (Pt reports homelessness, decreased mobility/pain. Was living in motel w/ her mother)      AM-PAC OT "6 Clicks" Daily Activity     Outcome Measure Help from another person eating meals?: None Help from another person taking care of personal grooming?: A Little Help from another person toileting, which includes using toliet, bedpan, or urinal?: Total Help from another person bathing (including washing, rinsing, drying)?: A Lot Help from another person to put on and taking off regular upper body clothing?: A Little Help from another person to put on and taking off regular  lower body clothing?: Total 6 Click Score: 14  End of Session Nurse Communication: Mobility status  Activity Tolerance: Patient tolerated treatment well;No increased pain Patient left: in bed;with call bell/phone within reach  OT Visit Diagnosis: Unsteadiness on feet (R26.81);Other abnormalities of gait and mobility (R26.89);Muscle weakness (generalized) (M62.81);Pain;Other (comment) (increased body habitus/obesity) Pain - Right/Left:  (Abdomen and back) Pain - part of body:  (Abdomen and low back)                Time: [id number] OT Time Calculation (min): 35 min Charges:  OT General Charges $OT Visit: 1 Visit OT Evaluation $OT Eval Moderate Complexity: 1 Mod OT Treatments $Self Care/Home Management : 8-22 mins  Aleda Madl Beth Dixon, OTR/L 11/03/2020, 10:36 AM

## 2020-11-03 NOTE — Plan of Care (Signed)
  Problem: Clinical Measurements: Goal: Ability to maintain clinical measurements within normal limits will improve Outcome: Adequate for Discharge   

## 2020-11-03 NOTE — Plan of Care (Signed)

## 2020-11-03 NOTE — Progress Notes (Addendum)
Patient seen and examined the bedside this morning. Hemodynamically stable.  She continues to complain of some chest pressure, abdominal bloating, nausea and other complaints. She looks comfortable.  She is medically stable from discharge from our perspective.  PT/OT has recommended skilled nursing facility.  TOC has been notified. There is no change in the medical management.She will finish the course of cefepime today Discharge orders and summary are already in.

## 2020-11-03 NOTE — TOC Initial Note (Addendum)
Transition of Care [facility]) - Initial/Assessment Note    Patient Details  Name: Heather [name] MRN: [id number] Date of Birth: [birth year]  Transition of Care [facility]) CM/SW Contact:    Sable Feil, LCSW Phone Number: 11/03/2020, 6:42 PM  Clinical Narrative:  Talked with patient at bedside regarding her discharge disposition and recommendation of SNF placement. Patient asked about her income and Medicaid. Ms. Hobby reported that she currently has no income and has a SSI hearing on 8/12. Ms. Scrivener reported that she received SSA age 14 (from a parent) until March 2020. She also reported receiving SSI, however no longer receives this income. Ms. Schissler indicated that she is unable to receive SSA disability as she has not worked enough to receive this.   Patient tearfully shared about losing her children and CSW emphatically listened. Ms. Sand also talked about her husband that she is currently separated from and her mother who she is not [place] with. Patient also discussed having no money to continue staying in a motel. Long-term SNF placement discussed and patient informed that her information would be sent to [facility] and surrounding counties. Ms. Sandeen requested that a facility in [place] be contacted - [facility] [phone] .             Expected Discharge Plan: Skilled Nursing Facility Barriers to Discharge: [place] (PASRR), Homeless with medical needs (Homeless) Requested clinicals faxed to [facility] MUST for PASRR number on 7/29.   Patient Goals and CMS Choice Patient states their goals for this hospitalization and ongoing recovery are:: Patient acknowleged that she has no where to live and currently has non income. CMS Medicare.gov Compare Post Acute Care list provided to:: Other (Comment Required) (Not provided on 7/29)    Expected Discharge Plan and Services Expected Discharge Plan: [place] In-house Referral: Clinical  Social Work   Post Acute Care Choice: [place] (Needs LTC bed) Living arrangements for the past 2 months: Homeless Expected Discharge Date: 11/02/20                                    Prior Living Arrangements/Services Living arrangements for the past 2 months: Homeless Lives with:: Self, Other (Comment) (Homeless. Had been staying in a motel) Patient language and need for interpreter reviewed:: No Do you feel safe going back to the place where you live?: No   Patient homeless  Need for Family Participation in Patient Care: No (Comment) Care giver support system in place?: No (comment)   Criminal Activity/Legal Involvement Pertinent to Current Situation/Hospitalization: No - Comment as needed  Activities of Daily Living Home Assistive Devices/Equipment: Gilford Rile (specify type) ADL Screening (condition at time of admission) Patient's cognitive ability adequate to safely complete daily activities?: Yes Is the patient deaf or have difficulty hearing?: No Does the patient have difficulty seeing, even when wearing glasses/contacts?: No Does the patient have difficulty concentrating, remembering, or making decisions?: No Patient able to express need for assistance with ADLs?: Yes Does the patient have difficulty dressing or bathing?: Yes Independently performs ADLs?: No Communication: Independent Dressing (OT): Needs assistance Is this a change from baseline?: Pre-admission baseline Grooming: Needs assistance Is this a change from baseline?: Pre-admission baseline Feeding: Independent Bathing: Needs assistance Is this a change from baseline?: Pre-admission baseline Toileting: Needs assistance Is this a change from baseline?: Pre-admission baseline In/Out Bed: Needs assistance Is this a change from baseline?:  Pre-admission baseline Walks in Home: Needs assistance Is this a change from baseline?: Pre-admission baseline Does the patient have difficulty walking  or climbing stairs?: Yes Weakness of Legs: Both Weakness of Arms/Hands: None  Permission Sought/Granted Permission sought to share information with : Other (comment) Doristine Bosworth and friend Kevin Fenton)    Share Information with NAME: Elease Hashimoto     Permission granted to share info w Relationship: 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 Mikle Bosworth - [phone] and Bernette Mayers [id number] [phone]  Emotional Assessment Appearance:: Appears stated age Attitude/Demeanor/Rapport: Engaged Affect (typically observed): Appropriate Orientation: : Oriented to Self, Oriented to Place, Oriented to  Time, Oriented to Situation Alcohol / Substance Use: Tobacco Use, Alcohol Use, Illicit Drugs (Per H&P patient quit smoking and does not drink or use illicit drugs.) Psych Involvement: Yes (comment)  Admission diagnosis:  Complicated UTI (urinary tract infection) [N39.0] Chest pain [R07.9] Urinary tract infection without hematuria, site unspecified [N39.0] Patient Active Problem List   Diagnosis Date Noted   Complicated UTI (urinary tract infection) 10/31/2020   Thrombocytosis 10/31/2020   Hypokalemia 10/31/2020   CKD (chronic kidney disease), stage III ([place]) 10/31/2020   Adjustment disorder with mixed anxiety and depressed mood 10/27/2020   Major depression, chronic 09/13/2020   Insomnia    Anemia of chronic disease    Hyponatremia    AKI (acute kidney injury) ([place])    Chronic bilateral low back pain without sciatica    Debility 08/24/2020   SIRS (systemic inflammatory response syndrome) ([place]) 08/18/2020   Pressure injury of skin 08/11/2020   Rhabdomyolysis 08/10/2020   Acute renal failure ([place]) 08/10/2020   PTSD (post-traumatic stress disorder) 05/15/2020   Generalized anxiety disorder 05/05/2020   Blood in stool    Gastritis and gastroduodenitis    Benign neoplasm of sigmoid colon     Difficult intubation 08/10/2018   Class 3 severe obesity with serious comorbidity and body mass index (BMI) greater than or equal to 70 in adult ([place]) 06/16/2018   Nexplanon insertion 03/27/2017   Postpartum hypertension 03/05/2017   Status post primary low transverse cesarean section 02/03/2017   H/O pre-eclampsia 01/27/2017   Gestational htn w/o significant proteinuria, third trimester 01/20/2017   Mild persistent asthma without complication [id number]   Perennial allergic rhinitis 11/05/2016   Mild persistent asthma with acute exacerbation 11/05/2016   Excess weight gain in pregnancy, second trimester 10/30/2016   Hard to intubate 11/07/2015   Hypoglycemia 11/07/2015   OSA (obstructive sleep apnea) 11/07/2015   DM type 2 (diabetes mellitus, type 2) ([place]) 12/07/2014   Panniculitis 12/06/2014   Cellulitis, abdominal wall 11/11/2014   Abdominal pain 07/14/2014   Loose stools 07/14/2014   Melena [id number]   Eosinophilic esophagitis [id number]   Change in bowel habits 04/28/2013   Esophageal dysphagia 04/28/2013   Insomnia due to mental disorder(327.02) 08/08/2011   RLS (restless legs syndrome) 08/08/2011   PALPITATIONS, OCCASIONAL 11/01/2009   Leucocytosis 07/28/2008   ALLERGIC RHINITIS, SEASONAL [id number]   DYSMETABOLIC SYNDROME [id number]   Morbid obesity ([place]) 05/13/2006   EXTERNAL HEMORRHOIDS 05/13/2006   HYPERLIPIDEMIA 05/12/2006   Essential hypertension 05/12/2006   Asthma 05/12/2006   OSTEOARTHRITIS 05/12/2006   PCP:  Lucianne Lei, MD Pharmacy:   [facility] [place], [place] [address] Phone: [phone] Fax: [phone]     Social Determinants of  Health (SDOH) Interventions  Patient homeless  Readmission Risk Interventions No flowsheet data found.

## 2020-11-03 NOTE — Progress Notes (Signed)
   11/03/20 1045  Clinical Encounter Type  Visited With Patient  Visit Type Initial  Referral From Nurse  Consult/Referral To Chaplain  Spiritual Encounters  Spiritual Needs Prayer;Emotional  Reviewed HCPOA with Heather [name].  Encouraged sharing feelings, offered emotional support, and provided prayer.  Will set up an appointment for Monday to have AD notarized.  Chaplain Timo Hartwig Morgan-Simpson [phone]

## 2020-11-03 NOTE — Plan of Care (Signed)
  Problem: Education: Goal: Knowledge of General Education information will improve Description: Including pain rating scale, medication(s)/side effects and non-pharmacologic comfort measures Outcome: Progressing   Problem: Health Behavior/Discharge Planning: Goal: Ability to manage health-related needs will improve Outcome: Progressing   Problem: Clinical Measurements: Goal: Ability to maintain clinical measurements within normal limits will improve Outcome: Progressing Goal: Will remain free from infection Outcome: Progressing Goal: Diagnostic test results will improve Outcome: Progressing Goal: Respiratory complications will improve Outcome: Progressing Goal: Cardiovascular complication will be avoided Outcome: Progressing   Problem: Elimination: Goal: Will not experience complications related to bowel motility Outcome: Progressing Goal: Will not experience complications related to urinary retention Outcome: Progressing   Problem: Pain Managment: Goal: General experience of comfort will improve Outcome: Progressing   

## 2020-11-03 NOTE — NC FL2 (Addendum)
[place] LEVEL OF CARE SCREENING TOOL     IDENTIFICATION  Patient Name: Heather [name] Birthdate: 13-Mar-1976 Sex: female Admission Date (Current Location): 10/30/2020  [place] and [place] Number:  Heather [name] [id number] [place] and Address:   [facility] [address] [place], [place]  [address]      Provider Number: [id number]  Attending Physician Name and Address:  Debbe Odea [address] [place], [place]  [address] Relative Name and Phone Number:       Current Level of Care: Hospital Recommended Level of Care: [place] (Patient in need of LTC bed) Prior Approval Number:    Date Approved/Denied:   PASRR Number:  (Submitted for [place] number 11/03/20. Currently pending)  Discharge Plan: SNF (LTC bed needed)    Current Diagnoses: Patient Active Problem List   Diagnosis Date Noted   Complicated UTI (urinary tract infection) 10/31/2020   Thrombocytosis 10/31/2020   Hypokalemia 10/31/2020   CKD (chronic kidney disease), stage III ([place]) 10/31/2020   Adjustment disorder with mixed anxiety and depressed mood 10/27/2020   Major depression, chronic 09/13/2020   Insomnia    Anemia of chronic disease    Hyponatremia    AKI (acute kidney injury) ([place])    Chronic bilateral low back pain without sciatica    Debility 08/24/2020   SIRS (systemic inflammatory response syndrome) ([place]) 08/18/2020   Pressure injury of skin 08/11/2020   Rhabdomyolysis 08/10/2020   Acute renal failure ([place]) 08/10/2020   PTSD (post-traumatic stress disorder) 05/15/2020   Generalized anxiety disorder 05/05/2020   Blood in stool    Gastritis and gastroduodenitis    Benign neoplasm of sigmoid colon    Difficult intubation 08/10/2018   Class 3 severe obesity with serious comorbidity and body mass index (BMI) greater than or equal to 70 in adult ([place]) 06/16/2018   Nexplanon insertion 03/27/2017   Postpartum hypertension 03/05/2017   Status post primary  low transverse cesarean section 02/03/2017   H/O pre-eclampsia 01/27/2017   Gestational htn w/o significant proteinuria, third trimester 01/20/2017   Mild persistent asthma without complication [id number]   Perennial allergic rhinitis 11/05/2016   Mild persistent asthma with acute exacerbation 11/05/2016   Excess weight gain in pregnancy, second trimester 10/30/2016   Hard to intubate 11/07/2015   Hypoglycemia 11/07/2015   OSA (obstructive sleep apnea) 11/07/2015   DM type 2 (diabetes mellitus, type 2) ([place]) 12/07/2014   Panniculitis 12/06/2014   Cellulitis, abdominal wall 11/11/2014   Abdominal pain 07/14/2014   Loose stools 07/14/2014   Melena [id number]   Eosinophilic esophagitis [id number]   Change in bowel habits 04/28/2013   Esophageal dysphagia 04/28/2013   Insomnia due to mental disorder(327.02) 08/08/2011   RLS (restless legs syndrome) 08/08/2011   PALPITATIONS, OCCASIONAL 11/01/2009   Leucocytosis 07/28/2008   ALLERGIC RHINITIS, SEASONAL [id number]   DYSMETABOLIC SYNDROME [id number]   Morbid obesity ([place]) 05/13/2006   EXTERNAL HEMORRHOIDS 05/13/2006   HYPERLIPIDEMIA 05/12/2006   Essential hypertension 05/12/2006   Asthma 05/12/2006   OSTEOARTHRITIS 05/12/2006    Orientation RESPIRATION BLADDER Height & Weight    Oriented person, place, time, situation    Normal Continent Weight: (!) 426 lb (193.2 kg) Height:  '5\' 3"'$  (160 cm)  BEHAVIORAL SYMPTOMS/MOOD NEUROLOGICAL BOWEL NUTRITION STATUS      Continent Diet (Carb modified)  AMBULATORY STATUS COMMUNICATION OF NEEDS Skin   Extensive Assist Verbally Other (Comment) (Reddened under breast and pannus)  Personal Care Assistance Level of Assistance  Bathing, Feeding, Dressing Bathing Assistance: Maximum assistance (Upper body min assist) Feeding assistance: Independent Dressing Assistance: Maximum assistance (Upper body min assist)     Functional Limitations Info  Sight, Hearing, Speech  Sight Info: Adequate Hearing Info: Adequate Speech Info: Adequate    SPECIAL CARE FACTORS FREQUENCY  PT (By licensed PT), OT (By licensed OT)     PT Frequency: Evaluated 7/28 OT Frequency: Evaluated 7/28            Contractures Contractures Info: Not present    Additional Factors Info  Code Status, Allergies Code Status Info: Full Allergies Info: Amoxicillin, Bee Venom, Penicillins, Penicillin G, Adhesive (Tape), Latex, Vancomycin           Current Medications (11/03/2020):  This is the current hospital active medication list Current Facility-Administered Medications  Medication Dose Route Frequency Provider Last Rate Last Admin   0.9 %  sodium chloride infusion  250 mL Intravenous PRN Orma Flaming, MD       acetaminophen (TYLENOL) tablet 650 mg  650 mg Oral Q6H PRN Orma Flaming, MD   650 mg at 11/03/20 1054   Or   acetaminophen (TYLENOL) suppository 650 mg  650 mg Rectal Q6H PRN Orma Flaming, MD       albuterol (VENTOLIN HFA) 108 (90 Base) MCG/ACT inhaler 2 puff  2 puff Inhalation Q4H PRN Orma Flaming, MD   2 puff at 11/02/20 2122   ascorbic acid (VITAMIN C) tablet 500 mg  500 mg Oral Daily Orma Flaming, MD   500 mg at 11/03/20 1055   budesonide (PULMICORT) nebulizer solution 0.25 mg  0.25 mg Nebulization BID Heloise Purpura, RPH   0.25 mg at 11/03/20 0810   buPROPion ER (WELLBUTRIN SR) 12 hr tablet 200 mg  200 mg Oral BID Orma Flaming, MD   200 mg at 11/03/20 1055   ceFEPIme (MAXIPIME) 2 g in sodium chloride 0.9 % 100 mL IVPB  2 g Intravenous Q12H Adhikari, Tamsen Meek, MD 200 mL/hr at 11/03/20 1103 2 g at 11/03/20 1103   clotrimazole (LOTRIMIN) 1 % cream   Topical BID Orma Flaming, MD   Given at 11/03/20 1056   enoxaparin (LOVENOX) injection 100 mg  100 mg Subcutaneous Q24H Orma Flaming, MD   100 mg at 11/03/20 1548   fluticasone (FLONASE) 50 MCG/ACT nasal spray 2 spray  2 spray Each Nare BID Orma Flaming, MD   2 spray at 11/03/20 1056   gabapentin  (NEURONTIN) capsule 200 mg  200 mg Oral TID Damita Dunnings B, MD   200 mg at 11/03/20 1547   montelukast (SINGULAIR) tablet 10 mg  10 mg Oral QHS Orma Flaming, MD   10 mg at 11/02/20 2125   nitroGLYCERIN (NITROSTAT) SL tablet 0.4 mg  0.4 mg Sublingual Q5 min PRN Shelly Coss, MD   0.4 mg at 11/01/20 1412   nystatin (MYCOSTATIN/NYSTOP) topical powder   Topical BID Orma Flaming, MD   Given at 11/03/20 1056   ondansetron (ZOFRAN) tablet 4 mg  4 mg Oral Q6H PRN Orma Flaming, MD       Or   ondansetron [facility]) injection 4 mg  4 mg Intravenous Q6H PRN Orma Flaming, MD   4 mg at 11/03/20 0843   pantoprazole (PROTONIX) EC tablet 40 mg  40 mg Oral Daily Shelly Coss, MD   40 mg at 11/03/20 1055   rOPINIRole (REQUIP) tablet 4 mg  4 mg Oral QHS Orma Flaming, MD   4  mg at 11/02/20 2123   senna-docusate (Senokot-S) tablet 1 tablet  1 tablet Oral QHS PRN Orma Flaming, MD       senna-docusate (Senokot-S) tablet 2 tablet  2 tablet Oral Daily Orma Flaming, MD   2 tablet at 11/03/20 1055   sodium chloride flush (NS) 0.9 % injection 3 mL  3 mL Intravenous Q12H Orma Flaming, MD   3 mL at 11/02/20 2133   sodium chloride flush (NS) 0.9 % injection 3 mL  3 mL Intravenous PRN Orma Flaming, MD       traMADol Veatrice Bourbon) tablet 50 mg  50 mg Oral Q8H PRN Orma Flaming, MD   50 mg at 11/02/20 1050   traZODone (DESYREL) tablet 50 mg  50 mg Oral QHS Damita Dunnings B, MD   50 mg at 11/02/20 2124     Discharge Medications: Please see discharge summary for a list of discharge medications.  Relevant Imaging Results:  Relevant Lab Results:   Additional Information ss#[id number]. Has had 1st pfizer vaccine and is requesting to get the 2nd vaccination. Patient is 426 pounds  Sharlet Salina, Mila Homer, [place]

## 2020-11-04 MED ORDER — WITCH HAZEL-GLYCERIN EX PADS
MEDICATED_PAD | CUTANEOUS | Status: DC | PRN
  Filled 2020-11-04 (×2): qty 100

## 2020-11-04 NOTE — Progress Notes (Signed)
Patient seen and examined the bedside this morning.  Hemodynamically stable.  Still complaining of some chest pressure, nausea , abdominal discomfort.  The symptoms are not new. She is medically stable for discharge as soon as bed is available skilled nursing facility.  There is no new change in the medical management.  Discharge summary and orders have already been placed.

## 2020-11-04 NOTE — Plan of Care (Signed)
  Problem: Clinical Measurements: Goal: Diagnostic test results will improve Outcome: Adequate for Discharge   

## 2020-11-05 LAB — CBC WITH DIFFERENTIAL/PLATELET
Abs Immature Granulocytes: 0.04 10*3/uL (ref 0.00–0.07)
Basophils Absolute: 0 10*3/uL (ref 0.0–0.1)
Basophils Relative: 1 %
Eosinophils Absolute: 0.5 10*3/uL (ref 0.0–0.5)
Eosinophils Relative: 6 %
HCT: 34.8 % — ABNORMAL LOW (ref 36.0–46.0)
Hemoglobin: 10.1 g/dL — ABNORMAL LOW (ref 12.0–15.0)
Immature Granulocytes: 1 %
Lymphocytes Relative: 31 %
Lymphs Abs: 2.2 10*3/uL (ref 0.7–4.0)
MCH: 25.3 pg — ABNORMAL LOW (ref 26.0–34.0)
MCHC: 29 g/dL — ABNORMAL LOW (ref 30.0–36.0)
MCV: 87.2 fL (ref 80.0–100.0)
Monocytes Absolute: 0.6 10*3/uL (ref 0.1–1.0)
Monocytes Relative: 8 %
Neutro Abs: 4 10*3/uL (ref 1.7–7.7)
Neutrophils Relative %: 53 %
Platelets: 411 10*3/uL — ABNORMAL HIGH (ref 150–400)
RBC: 3.99 MIL/uL (ref 3.87–5.11)
RDW: 19.1 % — ABNORMAL HIGH (ref 11.5–15.5)
WBC: 7.3 10*3/uL (ref 4.0–10.5)
nRBC: 0 % (ref 0.0–0.2)

## 2020-11-05 LAB — BASIC METABOLIC PANEL
Anion gap: 6 (ref 5–15)
BUN: 9 mg/dL (ref 6–20)
CO2: 28 mmol/L (ref 22–32)
Calcium: 8.6 mg/dL — ABNORMAL LOW (ref 8.9–10.3)
Chloride: 103 mmol/L (ref 98–111)
Creatinine, Ser: 1.21 mg/dL — ABNORMAL HIGH (ref 0.44–1.00)
GFR, Estimated: 57 mL/min — ABNORMAL LOW (ref >60)
Glucose, Bld: 84 mg/dL (ref 70–99)
Potassium: 4 mmol/L (ref 3.5–5.1)
Sodium: 137 mmol/L (ref 135–145)

## 2020-11-05 NOTE — Plan of Care (Signed)
  Problem: Clinical Measurements: Goal: Ability to maintain clinical measurements within normal limits will improve Outcome: Adequate for Discharge   

## 2020-11-05 NOTE — Plan of Care (Signed)
  Problem: Education: Goal: Knowledge of General Education information will improve Description Including pain rating scale, medication(s)/side effects and non-pharmacologic comfort measures Outcome: Progressing   Problem: Clinical Measurements: Goal: Will remain free from infection Outcome: Progressing   Problem: Activity: Goal: Risk for activity intolerance will decrease Outcome: Progressing   Problem: Nutrition: Goal: Adequate nutrition will be maintained Outcome: Progressing   Problem: Safety: Goal: Ability to remain free from injury will improve Outcome: Progressing   

## 2020-11-05 NOTE — Progress Notes (Signed)
Patient seen and examined at the bedside this morning.  Hemodynamically stable.  Still complaining of chest pressure, nausea and other different complaints.  Her chest pain is reproducible on palpation and is not ischemic in etiology.  There is no change in the medical management.  Patient is medically stable for discharge to skilled nursing facility as soon as bed is available.  TOC is following closely, currently she does not have any bed.  Discharge orders and summary are already in place.

## 2020-11-05 NOTE — TOC Progression Note (Signed)
Transition of Care [facility]) - Progression Note    Patient Details  Name: Shaquetta Keast Trunnell MRN: [id number] Date of Birth: 1975-06-16  [facility] of [facility]) CM/SW Contact  Reece Agar, [place] Phone Number: 11/05/2020, 4:10 PM  Clinical Narrative:    CSW attempted to contact admissions at [facility], receptionist stated that no one was available over the weekend but to call back during the weekday.   Expected Discharge Plan: Skilled Nursing Facility Barriers to Discharge: [place] Rosalie Gums), Homeless with medical needs (Homeless)  Expected Discharge Plan and Services Expected Discharge Plan: [place] In-house Referral: Clinical Social Work   Post Acute Care Choice: [place] (Needs LTC bed) Living arrangements for the past 2 months: Homeless Expected Discharge Date: 11/02/20                                     Social Determinants of Health (SDOH) Interventions    Readmission Risk Interventions No flowsheet data found.

## 2020-11-06 ENCOUNTER — Encounter (HOSPITAL_COMMUNITY): Payer: Self-pay

## 2020-11-06 ENCOUNTER — Telehealth (INDEPENDENT_AMBULATORY_CARE_PROVIDER_SITE_OTHER): Payer: Medicaid Other | Admitting: Psychiatry

## 2020-11-06 DIAGNOSIS — F332 Major depressive disorder, recurrent severe without psychotic features: Secondary | ICD-10-CM | POA: Insufficient documentation

## 2020-11-06 DIAGNOSIS — F4310 Post-traumatic stress disorder, unspecified: Secondary | ICD-10-CM | POA: Diagnosis not present

## 2020-11-06 MED ORDER — COVID-19 MRNA VACC (MODERNA) 100 MCG/0.5ML IM SUSP
0.5000 mL | Freq: Once | INTRAMUSCULAR | Status: AC
  Administered 2020-11-06: 0.5 mL via INTRAMUSCULAR
  Filled 2020-11-06: qty 0.5

## 2020-11-06 NOTE — Plan of Care (Signed)
  Problem: Education: Goal: Knowledge of General Education information will improve Description: Including pain rating scale, medication(s)/side effects and non-pharmacologic comfort measures Outcome: Progressing   Problem: Health Behavior/Discharge Planning: Goal: Ability to manage health-related needs will improve Outcome: Progressing   Problem: Clinical Measurements: Goal: Ability to maintain clinical measurements within normal limits will improve Outcome: Progressing Goal: Cardiovascular complication will be avoided Outcome: Progressing   Problem: Nutrition: Goal: Adequate nutrition will be maintained Outcome: Progressing   Problem: Elimination: Goal: Will not experience complications related to bowel motility Outcome: Progressing
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
## 2020-11-06 NOTE — Progress Notes (Signed)
Patient seen and examined at bedside this morning.  Hemodynamically stable.  Overall comfortable.  Denies any complaints today.  Her nausea is better.  She was requesting for second shot of COVID.  She is medically stable for discharge as soon as bed is available at skilled nursing facility.  TOC aware.  No change in the medical management.  Discharge summary and orders are already in.

## 2020-11-06 NOTE — Progress Notes (Signed)
Physical Therapy Treatment Patient Details Name: Heather [name] MRN: [id number] DOB: 20-May-1975 Today's Date: 11/06/2020    History of Present Illness 45 y.o. female presented to ED for chest pain and palpitations and feeling like her throat was closing while at her behavioral health facility for SI. Admitted 10/31/20 for treatment of complicated UTI PMH: anxiety and depression (hx of bipolar and schizoaffective disorder in chart in past), ACD, panniculitis, debility,GERD,  OSA not on cpap, chronic back pain.    PT Comments    Pt just finished lunch and is agreeable to therapy, stating "My bed is wet because I can't use the Purewick," Pt continues to be limited in safe mobility by decreased strength in presence of R knee and back pain. Pt is making progress towards her goals and is currently min A x2 for coming to EoB. modAx2 for standing in RW and lateral stepping along EoB. Able to change pads in bed and change wet gown while pt up. Pt requires maxAx2 to bring LE back into bed. Pt reports she is going to SNF and hopes it is a good one. PT will continue to follow acutely.     Follow Up Recommendations  SNF     Equipment Recommendations  None recommended by PT (has RW, left BSC at hotel)    Recommendations for Other Services OT consult     Precautions / Restrictions Precautions Precautions: Fall Precaution Comments: fall prior to hospitalization Restrictions Weight Bearing Restrictions: No    Mobility  Bed Mobility Overal bed mobility: Needs Assistance Bed Mobility: Supine to Sit;Sit to Supine     Supine to sit: HOB elevated;+2 for physical assistance;Min assist Sit to supine: +2 for physical assistance;Max assist   General bed mobility comments: bed deflated to improve mobility, minA for coming to EoB, cuing for hand placement and scooting to EoB,max A for return of LE to bed, with bed in trendelenberg pt able to pull herself up in the bed    Transfers Overall transfer  level: Needs assistance Equipment used: Rolling walker (2 wheeled) Transfers: Sit to/from Stand Sit to Stand: +2 physical assistance;Mod assist         General transfer comment: pt requires 2 attempts and modAx2 for coming into standing  Ambulation/Gait Ambulation/Gait assistance: Mod assist;+2 safety/equipment Gait Distance (Feet): 3 Feet Assistive device: Rolling walker (2 wheeled) Gait Pattern/deviations: Step-to pattern;Decreased step length - right;Decreased step length - left;Wide base of support;Antalgic;Decreased weight shift to right Gait velocity: slowed Gait velocity interpretation: <1.31 ft/sec, indicative of household ambulator General Gait Details: modAx2 for lateral stepping towards HoB, pt with c/o pain in low back and R knee. agree stepping away from bed not advisable today       Balance Overall balance assessment: Needs assistance Sitting-balance support: Feet supported;No upper extremity supported Sitting balance-Leahy Scale: Fair     Standing balance support: Bilateral upper extremity supported;During functional activity Standing balance-Leahy Scale: Poor Standing balance comment: requires B UE assist to maintain standing                            Cognition Arousal/Alertness: Awake/alert Behavior During Therapy: WFL for tasks assessed/performed Overall Cognitive Status: Impaired/Different from baseline Area of Impairment: Safety/judgement;Problem solving                         Safety/Judgement: Decreased awareness of safety;Decreased awareness of deficits   Problem Solving: Requires verbal cues;Requires tactile cues  General Comments: better understanding of current level of function stating "I don't think it would be a good idea to step away from the bed"      Exercises General Exercises - Lower Extremity Hip Flexion/Marching: AROM;Both;10 reps;Limitations    General Comments General comments (skin integrity, edema, etc.):  VSS on RA      Pertinent Vitals/Pain Pain Assessment: 0-10 Pain Score: 8  Pain Location: back and R knee Pain Descriptors / Indicators: Aching;Sharp;Shooting;Sore Pain Intervention(s): Limited activity within patient's tolerance;Monitored during session;Repositioned     PT Goals (current goals can now be found in the care plan section) Acute Rehab PT Goals Patient Stated Goal: be able to get around on her own PT Goal Formulation: With patient Time For Goal Achievement: 11/16/20 Potential to Achieve Goals: Fair Progress towards PT goals: Progressing toward goals    Frequency    Min 2X/week      PT Plan Current plan remains appropriate       AM-PAC PT "6 Clicks" Mobility   Outcome Measure  Help needed turning from your back to your side while in a flat bed without using bedrails?: None Help needed moving from lying on your back to sitting on the side of a flat bed without using bedrails?: A Lot Help needed moving to and from a bed to a chair (including a wheelchair)?: Total Help needed standing up from a chair using your arms (e.g., wheelchair or bedside chair)?: Total Help needed to walk in hospital room?: Total Help needed climbing 3-5 steps with a railing? : Total 6 Click Score: 10    End of Session   Activity Tolerance: Patient limited by pain Patient left: in bed;with call bell/phone within reach;with bed alarm set Nurse Communication: Mobility status PT Visit Diagnosis: Unsteadiness on feet (R26.81);Other abnormalities of gait and mobility (R26.89);History of falling (Z91.81);Muscle weakness (generalized) (M62.81);Difficulty in walking, not elsewhere classified (R26.2);Pain Pain - Right/Left: Right Pain - part of body: Knee (back)     Time: [id number] PT Time Calculation (min) (ACUTE ONLY): 22 min  Charges:  $Therapeutic Activity: 8-22 mins                     Divante Kotch B. Migdalia Dk PT, DPT Acute Rehabilitation Services Pager [phone] Office  [phone]    [place] 11/06/2020, 1:43 PM

## 2020-11-07 LAB — CREATININE, SERUM
Creatinine, Ser: 1.16 mg/dL — ABNORMAL HIGH (ref 0.44–1.00)
GFR, Estimated: >60 mL/min (ref >60)

## 2020-11-07 NOTE — Plan of Care (Signed)
  Problem: Education: Goal: Knowledge of General Education information will improve Description: Including pain rating scale, medication(s)/side effects and non-pharmacologic comfort measures Outcome: Progressing   Problem: Health Behavior/Discharge Planning: Goal: Ability to manage health-related needs will improve Outcome: Progressing   Problem: Clinical Measurements: Goal: Ability to maintain clinical measurements within normal limits will improve Outcome: Progressing Goal: Will remain free from infection Outcome: Progressing Goal: Cardiovascular complication will be avoided Outcome: Progressing   Problem: Coping: Goal: Level of anxiety will decrease Outcome: Progressing   Problem: Elimination: Goal: Will not experience complications related to bowel motility Outcome: Progressing Goal: Will not experience complications related to urinary retention Outcome: Progressing   Problem: Pain Managment: Goal: General experience of comfort will improve Outcome: Progressing   Problem: Safety: Goal: Ability to remain free from injury will improve Outcome: Progressing   Problem: Skin Integrity: Goal: Risk for impaired skin integrity will decrease Outcome: Progressing   Problem: Education: Goal: Knowledge of warning signs, risks, and behaviors that relate to suicide ideation and self-harm behaviors will improve Outcome: Progressing   Problem: Health Behavior/Discharge (Transition) Planning: Goal: Ability to manage health-related needs will improve Outcome: Progressing   Problem: Clinical Measurements: Goal: Remain free from any harm during hospitalization Outcome: Progressing   Problem: Medication Management: Goal: Adhere to prescribed medication regimen Outcome: Progressing

## 2020-11-07 NOTE — Plan of Care (Signed)
  Problem: Education: Goal: Knowledge of General Education information will improve Description: Including pain rating scale, medication(s)/side effects and non-pharmacologic comfort measures Outcome: Completed/Met   Problem: Clinical Measurements: Goal: Ability to maintain clinical measurements within normal limits will improve Outcome: Completed/Met Goal: Will remain free from infection Outcome: Completed/Met Goal: Cardiovascular complication will be avoided Outcome: Completed/Met

## 2020-11-07 NOTE — Progress Notes (Signed)
Occupational Therapy Treatment Patient Details Name: Heather [name] MRN: [id number] DOB: 08/03/1975 Today's Date: 11/07/2020    History of present illness 45 y.o. female presented to ED for chest pain and palpitations and feeling like her throat was closing while at her behavioral health facility for SI. Admitted 10/31/20 for treatment of complicated UTI PMH: anxiety and depression (hx of bipolar and schizoaffective disorder in chart in past), ACD, panniculitis, debility,GERD,  OSA not on cpap, chronic back pain.   OT comments  Patient progressing and showed improved ability to mobilize herself from sit to supine with supervision only, compared to previous session when pt required Max As. Patient remains limited by some self-limiting behaviors perhaps connected to decreased mood, as well as generalized weakness, chronic pain and impaired activity tolerance, along with deficits noted below. Pt continues to demonstrate fair rehab potential and would benefit from continued skilled OT to increase safety and independence with ADLs and functional transfers to allow pt to return home safely and reduce caregiver burden and fall risk.   Follow Up Recommendations  SNF;Supervision/Assistance - 24 hour    Equipment Recommendations       Recommendations for Other Services      Precautions / Restrictions Precautions Precautions: Fall Precaution Comments: fall prior to hospitalization Restrictions Weight Bearing Restrictions: No       Mobility Bed Mobility Overal bed mobility: Needs Assistance Bed Mobility: Supine to Sit;Sit to Supine     Supine to sit: HOB elevated;Min assist Sit to supine: Supervision   General bed mobility comments: bed deflated to improve mobility and safety, min A for coming to EoB to elevatte trunk. Pt able to return to supine with supervision and scoot up to [facility] in supine with supervision/cues and increased time/effort.    Transfers                 General  transfer comment: Not attempted as pt needs 2 people for safety.    Balance   Sitting-balance support: Feet supported;No upper extremity supported (1 foot supported) Sitting balance-Leahy Scale: Fair                                     ADL either performed or assessed with clinical judgement   ADL Overall ADL's : Needs assistance/impaired     Grooming: Sitting;Wash/dry face;Brushing hair;Supervision/safety;Set up Grooming Details (indicate cue type and reason): Pt able to balance EOB to perform grooming with setup/supervision. Upper Body Bathing: Moderate assistance;Sitting Upper Body Bathing Details (indicate cue type and reason): Sitting EOB, pt assisted with washing hair using washcloth technique. Cues to use BUEs to scrub scalp as pt asking OT to perform it "because it feels better when someone else does it." Pt reminded that therapy is for gaining strength and independence, and pt agreed to perform as able.                                 Vision   Vision Assessment?: No apparent visual deficits   Perception     Praxis      Cognition Arousal/Alertness: Awake/alert Behavior During Therapy: WFL for tasks assessed/performed Overall Cognitive Status: Impaired/Different from baseline Area of Impairment: Safety/judgement;Problem solving                         Safety/Judgement: Decreased awareness of deficits  Problem Solving: Requires verbal cues General Comments: Pt demonstrating decreased awareness of deficits and decreased undertanding of need for mobility/exercise to gain strength. Pt stating "I wish I could go to the inpatient rehab here." But when asked to move to EOB pt responding, "Can't we just do everything in the bed?"        Exercises Other Exercises Other Exercises: Pt asked for "stretches". Pt unable to demonstrate any known stretches when asked. Pt ed on cervical rotation and lateral stretches as well as runner stretch  to each UE. Pt demonstrated each back with 20 second hold. Pt unable to perform overhead reach stretch due to "my shoulders"   Shoulder Instructions       General Comments      Pertinent Vitals/ Pain       Pain Assessment: 0-10 Pain Score: 3  Pain Location: back and R knee Pain Descriptors / Indicators: Aching;Sharp;Shooting;Sore Pain Intervention(s): Limited activity within patient's tolerance;Monitored during session;Repositioned  Home Living                                          Prior Functioning/Environment              Frequency  Min 2X/week        Progress Toward Goals  OT Goals(current goals can now be found in the care plan section)  Progress towards OT goals: Progressing toward goals  Acute Rehab OT Goals Patient Stated Goal: be able to get around on her own OT Goal Formulation: With patient Time For Goal Achievement: 11/24/20 Potential to Achieve Goals: [place] Discharge plan remains appropriate    Co-evaluation                 AM-PAC OT "6 Clicks" Daily Activity     Outcome Measure   Help from another person eating meals?: None Help from another person taking care of personal grooming?: A Little Help from another person toileting, which includes using toliet, bedpan, or urinal?: Total Help from another person bathing (including washing, rinsing, drying)?: A Lot Help from another person to put on and taking off regular upper body clothing?: A Little Help from another person to put on and taking off regular lower body clothing?: Total 6 Click Score: 14    End of Session    OT Visit Diagnosis: Unsteadiness on feet (R26.81);Other abnormalities of gait and mobility (R26.89);Muscle weakness (generalized) (M62.81);Pain;Other (comment) Pain - Right/Left: Right Pain - part of body: Knee (and LBP)   Activity Tolerance Patient tolerated treatment well;No increased pain   Patient Left in bed;with call bell/phone within  reach   Nurse Communication Other (comment) (Assist with bed)        Time: [id number] OT Time Calculation (min): 37 min  Charges: OT General Charges $OT Visit: 1 Visit OT Treatments $Self Care/Home Management : 8-22 mins $Therapeutic Activity: 8-22 mins  Anderson [place], [place] Office: [phone] 11/07/2020    Julien Girt 11/07/2020, 9:39 AM

## 2020-11-07 NOTE — Progress Notes (Signed)
PROGRESS NOTE    Heather [name]  [id number] DOB: Oct 10, 1975 DOA: 10/30/2020 PCP: Lucianne Lei, MD   Chief Complain: Chest pain  Brief Narrative:  Patient is a 45 year old female with history of anxiety/depression/bipolar disorder/she is affective disorder, pancreatitis, GERD, OSA not on CPAP, chronic back pain who presented with chest pain, palpitation.  She initially went to behavioral [facility] for suicidal thoughts but without any plan, developed chest pain there and sent to the emergency department.When she presented to the emergency department, her chest pain has resolved.  She also recently had a urine culture done at [facility] which showed multidrug-resistant organism and was advised to go to the hospital.  Patient also complained of right-sided flank pain for few days, suprapubic pain, with some nausea and vomiting .  Patient admitted of having suicidal ideation and also reported visual hallucination.  She is currently homeless.  Patient was admitted here for the management of multidrug-resistant UTI, suicidal ideation, homelessness.  Psychiatry/TOC consulted.  Psychiatry ruled out imminent risks to herself saying she is not suicidal.  Sitter discontinued.  PT recommended skilled nursing facility on discharge.  She is medically stable for discharge as soon as bed is available.  TOC is following.  Difficulty in placement due to her morbid obesity and custodial status.    Assessment & Plan:   Principal Problem:   Complicated UTI (urinary tract infection) Active Problems:   Asthma   PALPITATIONS, OCCASIONAL   Panniculitis   OSA (obstructive sleep apnea)   Generalized anxiety disorder   Major depression, chronic   Thrombocytosis   Hypokalemia   CKD (chronic kidney disease), stage III ([place])  Multidrug-resistant UTI: Recently had urine culture done at [facility] which showed >100, 000 DFU of enterobacter cloacae complex and 10,000-50,000 DFU of  corynebacterium.  Her urine culture on 5/25 also had shown Enterobacter cloacae which was multidrug-resistant. We suspect this is colonization.  Also reported nausea, vomiting, right CVA tenderness, suprapubic pain.  Had leukocytosis on presentation.  She was initially started on Invanz, later changed to cefepime.  She finished 3 days course of cefepime . CT abdomen showed 3 mm nonobstructive right renal calculi and left lower pole stones renal staghorn calculi measuring up to 5.3 cm.  No hydronephrosis. Currently she does not have any active signs of urine tract infection, she denies any dysuria, she does not have  leukocytosis Regarding management of her renal stones, she needs to follow-up with urology as an outpatient.   Suicidal ideation: Had a plan but no intent.  Currently denies any suicidal ideation.  Psychiatry consulted, not recommending inpatient psychiatric admission.  Sitter discontinued.  Recommended to continue Wellbutrin, gabapentin, trazodone   Chest pressure/pain: Complains of chest pressure.  Troponin 30 cycled, negative.  EKG did not show any ischemic changes.  Most likely this is associated with GERD or musculoskeletal etiology.     History of generalized anxiety disorder/depression: Psychiatry consulted.  Continue current medications   Panniculitis: Continue clotrimazole   History of asthma: Currently not in exacerbation.  On Singulair and home inhaler   CKD stage IIIa: Continue monitoring.  Currently kidney function at baseline   Hypokalemia: Supplemented with potassium and corrected   OSA: Not on CPAP at home.  We recommend sleep study as an outpatient   Homelessness: Social worker consulted,PT recommended SNF   Hypotension: Likely her baseline blood pressure is on the lower side. BP soft but stable   Super morbid obesity: BMI of 75  DVT prophylaxis: Lovenox Code Status: Full Family Communication: None at bedside Status is:  Inpatient   Dispo: The patient is from: Home              Anticipated d/c is to:SNF              Patient currently is medically stable for discharge   Difficult to place patient Yes     Consultants: None  Procedures:None  Antimicrobials:  Anti-infectives (From admission, onward)    Start     Dose/Rate Route Frequency Ordered Stop   11/01/20 1000  ceFEPIme (MAXIPIME) 2 g in sodium chloride 0.9 % 100 mL IVPB        2 g 200 mL/hr over 30 Minutes Intravenous Every 12 hours 10/31/20 1550 11/03/20 2309   11/01/20 0600  ceFEPIme (MAXIPIME) 2 g in sodium chloride 0.9 % 100 mL IVPB  Status:  Discontinued        2 g 200 mL/hr over 30 Minutes Intravenous Every 8 hours 10/31/20 1541 10/31/20 1550   10/31/20 1000  ertapenem (INVANZ) 1,000 mg in sodium chloride 0.9 % 100 mL IVPB  Status:  Discontinued        1 g 200 mL/hr over 30 Minutes Intravenous Every 24 hours 10/31/20 0948 10/31/20 1541       Subjective:  Patient seen and examined the bedside this morning.  Hemodynamically stable and comfortable today.  She was complaining of some nausea this morning but not during my evaluation.  Denies any chest pressure which she had yesterday.  Objective: Vitals:   11/06/20 1853 11/06/20 2058 11/07/20 0436 11/07/20 0902  BP: 107/67 (!) 114/56 110/63 (!) 105/56  Pulse: 72 79 73 76  Resp: '19 18 16 19  '$ Temp: 98.6 F (37 C) 98.3 F (36.8 C) 97.6 F (36.4 C) 98.2 F (36.8 C)  TempSrc: Oral Oral Oral   SpO2: 95% 98% 100% 100%  Weight:  (!) 193.2 kg    Height:        Intake/Output Summary (Last 24 hours) at 11/07/2020 1345 Last data filed at 11/07/2020 1300 Gross per 24 hour  Intake 1297 ml  Output 1600 ml  Net -303 ml   Filed Weights   11/01/20 0032 11/01/20 0623 11/06/20 2058  Weight: (!) 191.9 kg (!) 193.2 kg (!) 193.2 kg    Examination:  General exam: Overall comfortable, not in distress,morbidly obese HEENT: PERRL Respiratory system:  no wheezes or crackles  Cardiovascular  system: S1 & S2 heard, RRR.  Gastrointestinal system: Abdomen is nondistended, soft and nontender.pannus Central nervous system: Alert and oriented Extremities: No edema, no clubbing ,no cyanosis Skin: No rashes, no ulcers,no icterus       Data Reviewed: I have personally reviewed following labs and imaging studies  CBC: Recent Labs  Lab 11/01/20 0350 11/05/20 0217  WBC 8.0 7.3  NEUTROABS  --  4.0  HGB 9.8* 10.1*  HCT 33.2* 34.8*  MCV 86.5 87.2  PLT 428* 123456*   Basic Metabolic Panel: Recent Labs  Lab 10/31/20 1908 11/01/20 0350 11/02/20 0321 11/03/20 0950 11/05/20 0217 11/07/20 0521  NA  --  135 135 137 137  --   K  --  3.3* 3.1* 4.1 4.0  --   CL  --  102 102 104 103  --   CO2  --  '27 25 24 28  '$ --   GLUCOSE  --  83 96 89 84  --   BUN  --  '13 9 8 9  '$ --  CREATININE  --  1.57* 1.46* 1.24* 1.21* 1.16*  CALCIUM  --  8.9 8.8* 9.4 8.6*  --   MG 2.1  --   --   --   --   --    GFR: Estimated Creatinine Clearance: 108.4 mL/min (A) (by C-G formula based on SCr of 1.16 mg/dL (H)). Liver Function Tests: No results for input(s): AST, ALT, ALKPHOS, BILITOT, PROT, ALBUMIN in the last 168 hours.  No results for input(s): LIPASE, AMYLASE in the last 168 hours. No results for input(s): AMMONIA in the last 168 hours. Coagulation Profile: No results for input(s): INR, PROTIME in the last 168 hours. Cardiac Enzymes: No results for input(s): CKTOTAL, CKMB, CKMBINDEX, TROPONINI in the last 168 hours. BNP (last 3 results) No results for input(s): PROBNP in the last 8760 hours. HbA1C: No results for input(s): HGBA1C in the last 72 hours. CBG: No results for input(s): GLUCAP in the last 168 hours. Lipid Profile: No results for input(s): CHOL, HDL, LDLCALC, TRIG, CHOLHDL, LDLDIRECT in the last 72 hours. Thyroid Function Tests: No results for input(s): TSH, T4TOTAL, FREET4, T3FREE, THYROIDAB in the last 72 hours. Anemia Panel: No results for input(s): VITAMINB12, FOLATE, FERRITIN,  TIBC, IRON, RETICCTPCT in the last 72 hours. Sepsis Labs: No results for input(s): PROCALCITON, LATICACIDVEN in the last 168 hours.  Recent Results (from the past 240 hour(s))  Resp Panel by RT-PCR (Flu A&B, Covid) Nasopharyngeal Swab     Status: None   Collection Time: 10/31/20  9:10 AM   Specimen: Nasopharyngeal Swab; Nasopharyngeal(NP) swabs in vial transport medium  Result Value Ref Range Status   SARS Coronavirus 2 by RT PCR NEGATIVE NEGATIVE Final    Comment: (NOTE) SARS-CoV-2 target nucleic acids are NOT DETECTED.  The SARS-CoV-2 RNA is generally detectable in upper respiratory specimens during the acute phase of infection. The lowest concentration of SARS-CoV-2 viral copies this assay can detect is 138 copies/mL. A negative result does not preclude SARS-Cov-2 infection and should not be used as the sole basis for treatment or other patient management decisions. A negative result may occur with  improper specimen collection/handling, submission of specimen other than nasopharyngeal swab, presence of viral mutation(s) within the areas targeted by this assay, and inadequate number of viral copies(<138 copies/mL). A negative result must be combined with clinical observations, patient history, and epidemiological information. The expected result is Negative.  Fact Sheet for Patients:  EntrepreneurPulse.com.au  Fact Sheet for Healthcare Providers:  IncredibleEmployment.be  This test is no t yet approved or cleared by the [place] FDA and  has been authorized for detection and/or diagnosis of SARS-CoV-2 by FDA under an Emergency Use Authorization (EUA). This EUA will remain  in effect (meaning this test can be used) for the duration of the COVID-19 declaration under Section 564(b)(1) of the Act, 21 U.S.C.section 360bbb-3(b)(1), unless the authorization is terminated  or revoked sooner.       Influenza A by PCR NEGATIVE NEGATIVE Final    Influenza B by PCR NEGATIVE NEGATIVE Final    Comment: (NOTE) The Xpert Xpress SARS-CoV-2/FLU/RSV plus assay is intended as an aid in the diagnosis of influenza from Nasopharyngeal swab specimens and should not be used as a sole basis for treatment. Nasal washings and aspirates are unacceptable for Xpert Xpress SARS-CoV-2/FLU/RSV testing.  Fact Sheet for Patients: EntrepreneurPulse.com.au  Fact Sheet for Healthcare Providers: IncredibleEmployment.be  This test is not yet approved or cleared by the [place] FDA and has been authorized for detection and/or diagnosis  of SARS-CoV-2 by FDA under an Emergency Use Authorization (EUA). This EUA will remain in effect (meaning this test can be used) for the duration of the COVID-19 declaration under Section 564(b)(1) of the Act, 21 U.S.C. section 360bbb-3(b)(1), unless the authorization is terminated or revoked.  Performed at [facility], [place] [address] [place], [place] [address]          Radiology Studies: No results found.      Scheduled Meds:  ascorbic acid  500 mg Oral Daily   buPROPion ER  200 mg Oral BID   clotrimazole   Topical BID   enoxaparin (LOVENOX) injection  100 mg Subcutaneous Q24H   fluticasone  2 spray Each Nare BID   gabapentin  200 mg Oral TID   montelukast  10 mg Oral QHS   nystatin   Topical BID   pantoprazole  40 mg Oral Daily   rOPINIRole  4 mg Oral QHS   senna-docusate  2 tablet Oral Daily   sodium chloride flush  3 mL Intravenous Q12H   traZODone  50 mg Oral QHS   Continuous Infusions:  sodium chloride       LOS: 6 days    Time spent: 15 mins,More than 50% of that time was spent in counseling and/or coordination of care.      Shelly Coss, MD [facility] P8/05/2020, 1:45 PM

## 2020-11-08 DIAGNOSIS — N183 Chronic kidney disease, stage 3 unspecified: Secondary | ICD-10-CM

## 2020-11-08 DIAGNOSIS — E876 Hypokalemia: Secondary | ICD-10-CM

## 2020-11-08 NOTE — Progress Notes (Signed)
PROGRESS NOTE    Heather [name]   [id number]  DOB: [birth year]  DOA: 10/30/2020 PCP: Lucianne Lei, MD   Brief Narrative:  Heather [name] is a 45 year old female with morbid obesity, obstructive sleep apnea, COPD, MDD, GAD, insomnia and RLS, diabetes mellitus type 2,renal mass,  and numerous small wounds under her pannus, upper thighs and between her legs. The patient was discharged from [facility] on 5/13 where she was admitted since 5/4 for a fall. Severe weakness. She was discharged to CIR.  She returned on 7/26 for pain in right flank and suprapubic area along with nausea vomiting. She was started on treatment for a complicated UTI.  She is now waiting on a SNF  Subjective: No complaints.     Assessment & Plan:   Principal Problem:   Complicated UTI (urinary tract infection) - later felt to be complication- received 3 days of Cefepime  Active Problems: Nephrolithiasis - follow  Suicidal ideation- anxiety and depression - cleared by psychiatry - cont Wellbutrin, Trazodone and Gabapentin    CKD (chronic kidney disease), stage III ([facility]) - stable  Panniculitis - significant improved  Asthma - stable  Homeless - very weak- needs SNF  Body mass index is 75.46 kg/m.   Time spent in minutes: 35 DVT prophylaxis: Lovenox Code Status: Full code Family Communication:  Level of Care: Level of care: Telemetry Medical Disposition Plan:  Status is: Inpatient  Remains inpatient appropriate because:Unsafe d/c plan  Dispo: The patient is from:  homeless               Anticipated d/c is to: SNF              Patient currently is medically stable to d/c.   Difficult to place patient Yes      Consultants:  none Procedures:  none Antimicrobials:  Anti-infectives (From admission, onward)    Start     Dose/Rate Route Frequency Ordered Stop   11/01/20 1000  ceFEPIme (MAXIPIME) 2 g in sodium chloride 0.9 % 100 mL IVPB        2 g 200 mL/hr over 30  Minutes Intravenous Every 12 hours 10/31/20 1550 11/03/20 2309   11/01/20 0600  ceFEPIme (MAXIPIME) 2 g in sodium chloride 0.9 % 100 mL IVPB  Status:  Discontinued        2 g 200 mL/hr over 30 Minutes Intravenous Every 8 hours 10/31/20 1541 10/31/20 1550   10/31/20 1000  ertapenem (INVANZ) 1,000 mg in sodium chloride 0.9 % 100 mL IVPB  Status:  Discontinued        1 g 200 mL/hr over 30 Minutes Intravenous Every 24 hours 10/31/20 0948 10/31/20 1541        Objective: Vitals:   11/07/20 1731 11/07/20 2013 11/08/20 0501 11/08/20 0840  BP: 108/61 (!) 93/53 101/63 100/67  Pulse: 83 84 71 84  Resp: '19 20 18 16  '$ Temp: 98.2 F (36.8 C) 98.9 F (37.2 C) 98.2 F (36.8 C) 98.5 F (36.9 C)  TempSrc: Oral Oral  Oral  SpO2: 100% 95% 100% 96%  Weight:      Height:        Intake/Output Summary (Last 24 hours) at 11/08/2020 1738 Last data filed at 11/08/2020 0800 Gross per 24 hour  Intake 300 ml  Output 0 ml  Net 300 ml   Filed Weights   11/01/20 0032 11/01/20 0623 11/06/20 2058  Weight: (!) 191.9 kg (!) 193.2 kg (!) 193.2 kg  Examination: General exam: Appears comfortable  HEENT: PERRLA, oral mucosa moist, no sclera icterus or thrush Respiratory system: Clear to auscultation. Respiratory effort normal. Cardiovascular system: S1 & S2 heard, RRR.   Gastrointestinal system: Abdomen soft, non-tender, nondistended. Normal bowel sounds. Central nervous system: Alert and oriented. No focal neurological deficits. Extremities: No cyanosis, clubbing or edema Skin: No rashes or ulcers Psychiatry:  Mood & affect appropriate.     Data Reviewed: I have personally reviewed following labs and imaging studies  CBC: Recent Labs  Lab 11/05/20 0217  WBC 7.3  NEUTROABS 4.0  HGB 10.1*  HCT 34.8*  MCV 87.2  PLT 123456*   Basic Metabolic Panel: Recent Labs  Lab 11/02/20 0321 11/03/20 0950 11/05/20 0217 11/07/20 0521  NA 135 137 137  --   K 3.1* 4.1 4.0  --   CL 102 104 103  --   CO2 '25  24 28  '$ --   GLUCOSE 96 89 84  --   BUN '9 8 9  '$ --   CREATININE 1.46* 1.24* 1.21* 1.16*  CALCIUM 8.8* 9.4 8.6*  --    GFR: Estimated Creatinine Clearance: 108.4 mL/min (A) (by C-G formula based on SCr of 1.16 mg/dL (H)). Liver Function Tests: No results for input(s): AST, ALT, ALKPHOS, BILITOT, PROT, ALBUMIN in the last 168 hours. No results for input(s): LIPASE, AMYLASE in the last 168 hours. No results for input(s): AMMONIA in the last 168 hours. Coagulation Profile: No results for input(s): INR, PROTIME in the last 168 hours. Cardiac Enzymes: No results for input(s): CKTOTAL, CKMB, CKMBINDEX, TROPONINI in the last 168 hours. BNP (last 3 results) No results for input(s): PROBNP in the last 8760 hours. HbA1C: No results for input(s): HGBA1C in the last 72 hours. CBG: No results for input(s): GLUCAP in the last 168 hours. Lipid Profile: No results for input(s): CHOL, HDL, LDLCALC, TRIG, CHOLHDL, LDLDIRECT in the last 72 hours. Thyroid Function Tests: No results for input(s): TSH, T4TOTAL, FREET4, T3FREE, THYROIDAB in the last 72 hours. Anemia Panel: No results for input(s): VITAMINB12, FOLATE, FERRITIN, TIBC, IRON, RETICCTPCT in the last 72 hours. Urine analysis:    Component Value Date/Time   COLORURINE AMBER (A) 10/31/2020 1534   APPEARANCEUR CLOUDY (A) 10/31/2020 1534   APPEARANCEUR Cloudy (A) 08/06/2016 1209   LABSPEC 1.028 10/31/2020 1534   PHURINE 6.0 10/31/2020 1534   GLUCOSEU NEGATIVE 10/31/2020 1534   HGBUR MODERATE (A) 10/31/2020 1534   HGBUR large 04/19/2008 0952   BILIRUBINUR NEGATIVE 10/31/2020 1534   BILIRUBINUR Negative 08/06/2016 1209   KETONESUR NEGATIVE 10/31/2020 1534   PROTEINUR 30 (A) 10/31/2020 1534   UROBILINOGEN 0.2 12/06/2014 2049   NITRITE POSITIVE (A) 10/31/2020 1534   LEUKOCYTESUR LARGE (A) 10/31/2020 1534   Sepsis Labs: '@LABRCNTIP'$ (procalcitonin:4,lacticidven:4) ) Recent Results (from the past 240 hour(s))  Resp Panel by RT-PCR (Flu A&B,  Covid) Nasopharyngeal Swab     Status: None   Collection Time: 10/31/20  9:10 AM   Specimen: Nasopharyngeal Swab; Nasopharyngeal(NP) swabs in vial transport medium  Result Value Ref Range Status   SARS Coronavirus 2 by RT PCR NEGATIVE NEGATIVE Final    Comment: (NOTE) SARS-CoV-2 target nucleic acids are NOT DETECTED.  The SARS-CoV-2 RNA is generally detectable in upper respiratory specimens during the acute phase of infection. The lowest concentration of SARS-CoV-2 viral copies this assay can detect is 138 copies/mL. A negative result does not preclude SARS-Cov-2 infection and should not be used as the sole basis for treatment or other patient management  decisions. A negative result may occur with  improper specimen collection/handling, submission of specimen other than nasopharyngeal swab, presence of viral mutation(s) within the areas targeted by this assay, and inadequate number of viral copies(<138 copies/mL). A negative result must be combined with clinical observations, patient history, and epidemiological information. The expected result is Negative.  Fact Sheet for Patients:  EntrepreneurPulse.com.au  Fact Sheet for Healthcare Providers:  IncredibleEmployment.be  This test is no t yet approved or cleared by the [place] FDA and  has been authorized for detection and/or diagnosis of SARS-CoV-2 by FDA under an Emergency Use Authorization (EUA). This EUA will remain  in effect (meaning this test can be used) for the duration of the COVID-19 declaration under Section 564(b)(1) of the Act, 21 U.S.C.section 360bbb-3(b)(1), unless the authorization is terminated  or revoked sooner.       Influenza A by PCR NEGATIVE NEGATIVE Final   Influenza B by PCR NEGATIVE NEGATIVE Final    Comment: (NOTE) The Xpert Xpress SARS-CoV-2/FLU/RSV plus assay is intended as an aid in the diagnosis of influenza from Nasopharyngeal swab specimens and should  not be used as a sole basis for treatment. Nasal washings and aspirates are unacceptable for Xpert Xpress SARS-CoV-2/FLU/RSV testing.  Fact Sheet for Patients: EntrepreneurPulse.com.au  Fact Sheet for Healthcare Providers: IncredibleEmployment.be  This test is not yet approved or cleared by the [place] FDA and has been authorized for detection and/or diagnosis of SARS-CoV-2 by FDA under an Emergency Use Authorization (EUA). This EUA will remain in effect (meaning this test can be used) for the duration of the COVID-19 declaration under Section 564(b)(1) of the Act, 21 U.S.C. section 360bbb-3(b)(1), unless the authorization is terminated or revoked.  Performed at [facility], [place] [address], [place], [place] [address]          Radiology Studies: No results found.    Scheduled Meds:  ascorbic acid  500 mg Oral Daily   buPROPion ER  200 mg Oral BID   clotrimazole   Topical BID   enoxaparin (LOVENOX) injection  100 mg Subcutaneous Q24H   fluticasone  2 spray Each Nare BID   gabapentin  200 mg Oral TID   montelukast  10 mg Oral QHS   nystatin   Topical BID   pantoprazole  40 mg Oral Daily   rOPINIRole  4 mg Oral QHS   senna-docusate  2 tablet Oral Daily   sodium chloride flush  3 mL Intravenous Q12H   traZODone  50 mg Oral QHS   Continuous Infusions:  sodium chloride       LOS: 7 days      Debbe Odea, MD [facility] Pager: [email or web address] 11/08/2020, 5:38 PM

## 2020-11-09 NOTE — Progress Notes (Signed)
Physical Therapy Treatment Patient Details Name: Heather [name] MRN: [id number] DOB: October 05, 1975 Today's Date: 11/09/2020    History of Present Illness 45 y.o. female presented to ED for chest pain and palpitations and feeling like her throat was closing while at her behavioral health facility for SI. Admitted 10/31/20 for treatment of complicated UTI PMH: anxiety and depression (hx of bipolar and schizoaffective disorder in chart in past), ACD, panniculitis, debility,GERD,  OSA not on cpap, chronic back pain.    PT Comments    Able to procure bariatric recliner for today's session. Pt not thrilled with idea of sitting up in chair. Educated on need to tolerate OOB. Pt making good progress today. Able to come to EoB with  min guard. Pt modAx2 for power up to RW from bed surface. Pt reports that she feels she could walk and is able to walk 8 feet forward with Birman chair follow. Pt reports she thinks she could walk in hallway. Pt requires min Ax2 for power up from recliner and min guard to ambulate 40 feet in hallway with RW. D/c plans remain appropriate. PT will continue to follow acutely.   Follow Up Recommendations  SNF     Equipment Recommendations  None recommended by PT (has RW, left BSC at hotel)    Recommendations for Other Services OT consult     Precautions / Restrictions Precautions Precautions: Fall Precaution Comments: fall prior to hospitalization Restrictions Weight Bearing Restrictions: No    Mobility  Bed Mobility Overal bed mobility: Needs Assistance Bed Mobility: Supine to Sit;Sit to Supine     Supine to sit: HOB elevated;+2 for physical assistance;Min assist     General bed mobility comments: bed deflated to improve mobility, minA for coming to EoB, cuing for hand placement and scooting to EoB,max A for return of LE to bed, with bed in trendelenberg pt able to pull herself up in the bed    Transfers Overall transfer level: Needs assistance Equipment  used: Rolling walker (2 wheeled) Transfers: Sit to/from Stand Sit to Stand: +2 physical assistance;Mod assist;Min assist;From elevated surface         General transfer comment: modAx2 to power up from bed, and min Ax2 for power up from chair  Ambulation/Gait Ambulation/Gait assistance: +2 safety/equipment;Min assist;Min guard Gait Distance (Feet): 40 Feet (1x8, 1x40) Assistive device: Rolling walker (2 wheeled) Gait Pattern/deviations: Decreased step length - right;Decreased step length - left;Wide base of support;Antalgic;Decreased weight shift to right;Step-through pattern Gait velocity: slowed Gait velocity interpretation: <1.8 ft/sec, indicate of risk for recurrent falls General Gait Details: minAx2 for steadying and Strothers chair follow, initially walking to door, afterward pt reports she can walk in hallway, rolled out of room and able to ambulate 40 feet with slowed, waddling gait       Balance Overall balance assessment: Needs assistance Sitting-balance support: Feet supported;No upper extremity supported Sitting balance-Leahy Scale: Fair     Standing balance support: Bilateral upper extremity supported;During functional activity Standing balance-Leahy Scale: Poor Standing balance comment: requires B UE assist to maintain standing                            Cognition Arousal/Alertness: Awake/alert Behavior During Therapy: WFL for tasks assessed/performed Overall Cognitive Status: Impaired/Different from baseline Area of Impairment: Safety/judgement;Problem solving                         Safety/Judgement: Decreased awareness of safety;Decreased  awareness of deficits   Problem Solving: Requires verbal cues;Requires tactile cues General Comments: tearful when state she had to give up custody of her girls while she is getting better         General Comments General comments (skin integrity, edema, etc.): VSS on RA      Pertinent Vitals/Pain  Pain Assessment: Faces Faces Pain Scale: Hurts a little bit Pain Location: back and R knee Pain Descriptors / Indicators: Aching;Sharp;Shooting;Sore Pain Intervention(s): Limited activity within patient's tolerance;Monitored during session;Repositioned     PT Goals (current goals can now be found in the care plan section) Acute Rehab PT Goals Patient Stated Goal: be able to get around on her own PT Goal Formulation: With patient Time For Goal Achievement: 11/16/20 Potential to Achieve Goals: Fair Progress towards PT goals: Progressing toward goals    Frequency    Min 2X/week      PT Plan Current plan remains appropriate       AM-PAC PT "6 Clicks" Mobility   Outcome Measure  Help needed turning from your back to your side while in a flat bed without using bedrails?: None Help needed moving from lying on your back to sitting on the side of a flat bed without using bedrails?: A Lot Help needed moving to and from a bed to a chair (including a wheelchair)?: Total Help needed standing up from a chair using your arms (e.g., wheelchair or bedside chair)?: Total Help needed to walk in hospital room?: Total Help needed climbing 3-5 steps with a railing? : Total 6 Click Score: 10    End of Session   Activity Tolerance: Patient tolerated treatment well Patient left: with call bell/phone within reach;in chair;with chair alarm set Nurse Communication: Mobility status PT Visit Diagnosis: Unsteadiness on feet (R26.81);Other abnormalities of gait and mobility (R26.89);History of falling (Z91.81);Muscle weakness (generalized) (M62.81);Difficulty in walking, not elsewhere classified (R26.2);Pain Pain - Right/Left: Right Pain - part of body: Knee (back)     Time: [id number] PT Time Calculation (min) (ACUTE ONLY): 33 min  Charges:  $Gait Training: 8-22 mins $Therapeutic Activity: 8-22 mins                     Sieanna Vanstone B. Migdalia Dk PT, DPT Acute Rehabilitation Services Pager [phone] Office [phone]    [place] 11/09/2020, 12:45 PM

## 2020-11-09 NOTE — Progress Notes (Signed)
PROGRESS NOTE    Heather [name]   [id number]  DOB: 1975-04-28  DOA: 10/30/2020 PCP: Lucianne Lei, MD   Brief Narrative:  Heather [name] is a 45 year old female with morbid obesity, obstructive sleep apnea, COPD, MDD, GAD, insomnia and RLS, diabetes mellitus type 2,renal mass,  and numerous small wounds under her pannus, upper thighs and between her legs. The patient was discharged from [facility] on 5/13 where she was admitted since 5/4 for a fall. Severe weakness. She was discharged to CIR.  She returned on 7/26 for pain in right flank and suprapubic area along with nausea vomiting. She was started on treatment for a complicated UTI.  She is now waiting on a SNF  Subjective: No complaints    Assessment & Plan:   Principal Problem:   Complicated UTI (urinary tract infection) - later felt to be complication- received 3 days of Cefepime  Active Problems: Nephrolithiasis - follow  Suicidal ideation- anxiety and depression - cleared by psychiatry - cont Wellbutrin, Trazodone and Gabapentin    CKD (chronic kidney disease), stage III ([facility]) - stable  Panniculitis - significant improved  Asthma - stable  Homeless - very weak- needs SNF  Body mass index is 75.46 kg/m.   Time spent in minutes: 35 DVT prophylaxis: Lovenox Code Status: Full code Family Communication:  Level of Care: Level of care: Telemetry Medical Disposition Plan:  Status is: Inpatient  Remains inpatient appropriate because:Unsafe d/c plan  Dispo: The patient is from:  homeless               Anticipated d/c is to: SNF              Patient currently is medically stable to d/c.   Difficult to place patient Yes      Consultants:  none Procedures:  none Antimicrobials:  Anti-infectives (From admission, onward)    Start     Dose/Rate Route Frequency Ordered Stop   11/01/20 1000  ceFEPIme (MAXIPIME) 2 g in sodium chloride 0.9 % 100 mL IVPB        2 g 200 mL/hr over 30  Minutes Intravenous Every 12 hours 10/31/20 1550 11/03/20 2309   11/01/20 0600  ceFEPIme (MAXIPIME) 2 g in sodium chloride 0.9 % 100 mL IVPB  Status:  Discontinued        2 g 200 mL/hr over 30 Minutes Intravenous Every 8 hours 10/31/20 1541 10/31/20 1550   10/31/20 1000  ertapenem (INVANZ) 1,000 mg in sodium chloride 0.9 % 100 mL IVPB  Status:  Discontinued        1 g 200 mL/hr over 30 Minutes Intravenous Every 24 hours 10/31/20 0948 10/31/20 1541        Objective: Vitals:   11/08/20 0840 11/08/20 2146 11/09/20 0535 11/09/20 1009  BP: 100/67 113/60 (!) 106/56 (!) 117/59  Pulse: 84 79 73 77  Resp: '16 16 15 19  '$ Temp: 98.5 F (36.9 C) 99.6 F (37.6 C) 98.2 F (36.8 C) 98.3 F (36.8 C)  TempSrc: Oral Oral Oral Oral  SpO2: 96% 93% 94% 95%  Weight:      Height:        Intake/Output Summary (Last 24 hours) at 11/09/2020 1523 Last data filed at 11/09/2020 1400 Gross per 24 hour  Intake 1562 ml  Output 900 ml  Net 662 ml    Filed Weights   11/01/20 0032 11/01/20 0623 11/06/20 2058  Weight: (!) 191.9 kg (!) 193.2 kg (!) 193.2 kg  Examination: General exam: Appears comfortable  HEENT: PERRLA, oral mucosa moist, no sclera icterus or thrush Respiratory system: Clear to auscultation. Respiratory effort normal. Cardiovascular system: S1 & S2 heard, regular rate and rhythm Gastrointestinal system: Abdomen soft, non-tender, nondistended. Normal bowel sounds   Central nervous system: Alert and oriented. No focal neurological deficits. Extremities: No cyanosis, clubbing or edema Skin: No rashes or ulcers Psychiatry:  Mood & affect appropriate.      Data Reviewed: I have personally reviewed following labs and imaging studies  CBC: Recent Labs  Lab 11/05/20 0217  WBC 7.3  NEUTROABS 4.0  HGB 10.1*  HCT 34.8*  MCV 87.2  PLT 411*    Basic Metabolic Panel: Recent Labs  Lab 11/03/20 0950 11/05/20 0217 11/07/20 0521  NA 137 137  --   K 4.1 4.0  --   CL 104 103  --    CO2 24 28  --   GLUCOSE 89 84  --   BUN 8 9  --   CREATININE 1.24* 1.21* 1.16*  CALCIUM 9.4 8.6*  --     GFR: Estimated Creatinine Clearance: 108.4 mL/min (A) (by C-G formula based on SCr of 1.16 mg/dL (H)). Liver Function Tests: No results for input(s): AST, ALT, ALKPHOS, BILITOT, PROT, ALBUMIN in the last 168 hours. No results for input(s): LIPASE, AMYLASE in the last 168 hours. No results for input(s): AMMONIA in the last 168 hours. Coagulation Profile: No results for input(s): INR, PROTIME in the last 168 hours. Cardiac Enzymes: No results for input(s): CKTOTAL, CKMB, CKMBINDEX, TROPONINI in the last 168 hours. BNP (last 3 results) No results for input(s): PROBNP in the last 8760 hours. HbA1C: No results for input(s): HGBA1C in the last 72 hours. CBG: No results for input(s): GLUCAP in the last 168 hours. Lipid Profile: No results for input(s): CHOL, HDL, LDLCALC, TRIG, CHOLHDL, LDLDIRECT in the last 72 hours. Thyroid Function Tests: No results for input(s): TSH, T4TOTAL, FREET4, T3FREE, THYROIDAB in the last 72 hours. Anemia Panel: No results for input(s): VITAMINB12, FOLATE, FERRITIN, TIBC, IRON, RETICCTPCT in the last 72 hours. Urine analysis:    Component Value Date/Time   COLORURINE AMBER (A) 10/31/2020 1534   APPEARANCEUR CLOUDY (A) 10/31/2020 1534   APPEARANCEUR Cloudy (A) 08/06/2016 1209   LABSPEC 1.028 10/31/2020 1534   PHURINE 6.0 10/31/2020 1534   GLUCOSEU NEGATIVE 10/31/2020 1534   HGBUR MODERATE (A) 10/31/2020 1534   HGBUR large 04/19/2008 0952   BILIRUBINUR NEGATIVE 10/31/2020 1534   BILIRUBINUR Negative 08/06/2016 1209   KETONESUR NEGATIVE 10/31/2020 1534   PROTEINUR 30 (A) 10/31/2020 1534   UROBILINOGEN 0.2 12/06/2014 2049   NITRITE POSITIVE (A) 10/31/2020 1534   LEUKOCYTESUR LARGE (A) 10/31/2020 1534   Sepsis Labs: '@LABRCNTIP'$ (procalcitonin:4,lacticidven:4) ) Recent Results (from the past 240 hour(s))  Resp Panel by RT-PCR (Flu A&B, Covid)  Nasopharyngeal Swab     Status: None   Collection Time: 10/31/20  9:10 AM   Specimen: Nasopharyngeal Swab; Nasopharyngeal(NP) swabs in vial transport medium  Result Value Ref Range Status   SARS Coronavirus 2 by RT PCR NEGATIVE NEGATIVE Final    Comment: (NOTE) SARS-CoV-2 target nucleic acids are NOT DETECTED.  The SARS-CoV-2 RNA is generally detectable in upper respiratory specimens during the acute phase of infection. The lowest concentration of SARS-CoV-2 viral copies this assay can detect is 138 copies/mL. A negative result does not preclude SARS-Cov-2 infection and should not be used as the sole basis for treatment or other patient management decisions. A negative result  may occur with  improper specimen collection/handling, submission of specimen other than nasopharyngeal swab, presence of viral mutation(s) within the areas targeted by this assay, and inadequate number of viral copies(<138 copies/mL). A negative result must be combined with clinical observations, patient history, and epidemiological information. The expected result is Negative.  Fact Sheet for Patients:  EntrepreneurPulse.com.au  Fact Sheet for Healthcare Providers:  IncredibleEmployment.be  This test is no t yet approved or cleared by the [place] FDA and  has been authorized for detection and/or diagnosis of SARS-CoV-2 by FDA under an Emergency Use Authorization (EUA). This EUA will remain  in effect (meaning this test can be used) for the duration of the COVID-19 declaration under Section 564(b)(1) of the Act, 21 U.S.C.section 360bbb-3(b)(1), unless the authorization is terminated  or revoked sooner.       Influenza A by PCR NEGATIVE NEGATIVE Final   Influenza B by PCR NEGATIVE NEGATIVE Final    Comment: (NOTE) The Xpert Xpress SARS-CoV-2/FLU/RSV plus assay is intended as an aid in the diagnosis of influenza from Nasopharyngeal swab specimens and should not be  used as a sole basis for treatment. Nasal washings and aspirates are unacceptable for Xpert Xpress SARS-CoV-2/FLU/RSV testing.  Fact Sheet for Patients: EntrepreneurPulse.com.au  Fact Sheet for Healthcare Providers: IncredibleEmployment.be  This test is not yet approved or cleared by the [place] FDA and has been authorized for detection and/or diagnosis of SARS-CoV-2 by FDA under an Emergency Use Authorization (EUA). This EUA will remain in effect (meaning this test can be used) for the duration of the COVID-19 declaration under Section 564(b)(1) of the Act, 21 U.S.C. section 360bbb-3(b)(1), unless the authorization is terminated or revoked.  Performed at [facility], [place] [address] [place], [place] [address]          Radiology Studies: No results found.    Scheduled Meds:  ascorbic acid  500 mg Oral Daily   buPROPion ER  200 mg Oral BID   clotrimazole   Topical BID   enoxaparin (LOVENOX) injection  100 mg Subcutaneous Q24H   fluticasone  2 spray Each Nare BID   gabapentin  200 mg Oral TID   montelukast  10 mg Oral QHS   nystatin   Topical BID   pantoprazole  40 mg Oral Daily   rOPINIRole  4 mg Oral QHS   senna-docusate  2 tablet Oral Daily   sodium chloride flush  3 mL Intravenous Q12H   traZODone  50 mg Oral QHS   Continuous Infusions:  sodium chloride       LOS: 8 days      Debbe Odea, MD [facility] Pager: [email or web address] 11/09/2020, 3:23 PM

## 2020-11-09 NOTE — Plan of Care (Signed)
  Problem: Coping: Goal: Level of anxiety will decrease Outcome: Completed/Met   Problem: Elimination: Goal: Will not experience complications related to bowel motility Outcome: Completed/Met Goal: Will not experience complications related to urinary retention Outcome: Completed/Met   Problem: Pain Managment: Goal: General experience of comfort will improve Outcome: Completed/Met   Problem: Safety: Goal: Ability to remain free from injury will improve Outcome: Completed/Met

## 2020-11-10 DIAGNOSIS — N390 Urinary tract infection, site not specified: Secondary | ICD-10-CM | POA: Diagnosis not present

## 2020-11-10 LAB — HEPARIN ANTI-XA: Heparin LMW: 0.52 IU/mL

## 2020-11-10 MED ORDER — CALCIUM CARBONATE ANTACID 500 MG PO CHEW
400.0000 mg | CHEWABLE_TABLET | Freq: Two times a day (BID) | ORAL | Status: AC
  Administered 2020-11-11 – 2020-11-20 (×20): 400 mg via ORAL
  Filled 2020-11-10 (×20): qty 2

## 2020-11-10 NOTE — TOC Progression Note (Addendum)
Transition of Care [facility]) - Progression Note    Patient Details  Name: Heather [name] MRN: [id number] Date of Birth: 07-27-1975  Transition of Care [facility]) CM/SW Contact  [name], LCSW Phone Number: 11/10/2020, 6:45 PM  Clinical Narrative:  Search continues for a nursing facility for patient - LTC be needed. Call made to [facility] and [facility] [phone]) today at patient's request. CSW advised that they do not take person who don't already have LTC Medicaid. Search for a facility will continue.   5:59 pm: Advised [name] regarding [facility] and Rehab. She asked that [facility] be contacted regarding LTC bed. Patient informed that contact will be made with [facility] and CSW will f/u with her next week.   Expected Discharge Plan: Skilled Nursing Facility Barriers to Discharge: [facility]), Homeless with medical needs (Homeless)  Expected Discharge Plan and Services Expected Discharge Plan: [place] In-house Referral: Clinical Social Work   Post Acute Care Choice: Door (Needs LTC bed) Living arrangements for the past 2 months: Homeless Expected Discharge Date: 11/02/20                                     Social Determinants of Health (SDOH) Interventions  No SDOH interventions needed or requested at this time.  Readmission Risk Interventions No flowsheet data found.

## 2020-11-10 NOTE — Progress Notes (Signed)
PROGRESS NOTE    Heather [name]   [id number]  DOB: 1975-11-28  DOA: 10/30/2020 PCP: Lucianne Lei, MD   Brief Narrative:  Heather [name] is a 45 year old female with morbid obesity, obstructive sleep apnea, COPD, MDD, GAD, insomnia and RLS, diabetes mellitus type 2,renal mass,  and numerous small wounds under her pannus, upper thighs and between her legs. The patient was discharged from [facility] on 5/13 where she was admitted since 5/4 for a fall. Severe weakness. She was discharged to CIR.  She returned on 7/26 for pain in right flank and suprapubic area along with nausea vomiting. She was started on treatment for a complicated UTI.  She is now waiting on a SNF  Subjective:  -Resting comfortably in bed, asking for bedpan, complains of reflux  -Patient remains medically stable for transfer to SNF rehab when bed is available  Assessment & Plan:   Principal Problem:   Complicated UTI (urinary tract infection) -- received 3 days of Cefepime  Active Problems: Nephrolithiasis -Currently asymptomatic  Suicidal ideation- anxiety and depression - cleared by psychiatry - cont Wellbutrin, Trazodone and Gabapentin  - CKD stage - 3A   -Overall stable with creatinine around 1.2 -- renally adjust medications, avoid nephrotoxic agents / dehydration  / hypotension  Panniculitis - significant improved  Asthma - stable  Homeless - very weak- needs SNF  Dispo--- -Patient remains medically stable for transfer to SNF rehab when bed is available  Body mass index is 75.46 kg/m.   Time spent in minutes: 35 DVT prophylaxis: Lovenox Code Status: Full code Family Communication:  Level of Care: Level of care: Telemetry Medical Disposition Plan:  Status is: Inpatient  Remains inpatient appropriate because:Unsafe d/c plan  Dispo: The patient is from:  homeless               Anticipated d/c is to: SNF              Patient currently is medically stable to d/c.    Difficult to place patient Yes - -Patient remains medically stable for transfer to SNF rehab when bed is available     Consultants:  none Procedures:  none Antimicrobials:  Anti-infectives (From admission, onward)    Start     Dose/Rate Route Frequency Ordered Stop   11/01/20 1000  ceFEPIme (MAXIPIME) 2 g in sodium chloride 0.9 % 100 mL IVPB        2 g 200 mL/hr over 30 Minutes Intravenous Every 12 hours 10/31/20 1550 11/03/20 2309   11/01/20 0600  ceFEPIme (MAXIPIME) 2 g in sodium chloride 0.9 % 100 mL IVPB  Status:  Discontinued        2 g 200 mL/hr over 30 Minutes Intravenous Every 8 hours 10/31/20 1541 10/31/20 1550   10/31/20 1000  ertapenem (INVANZ) 1,000 mg in sodium chloride 0.9 % 100 mL IVPB  Status:  Discontinued        1 g 200 mL/hr over 30 Minutes Intravenous Every 24 hours 10/31/20 0948 10/31/20 1541        Objective: Vitals:   11/09/20 1948 11/10/20 0621 11/10/20 1056 11/10/20 1755  BP: (!) 97/52 118/64 (!) 111/58 137/70  Pulse: 85 70 78 87  Resp: '18 18 18 18  '$ Temp: 98.9 F (37.2 C) 98 F (36.7 C) 98.1 F (36.7 C) 98.2 F (36.8 C)  TempSrc: Oral Oral Oral Oral  SpO2: 96% 98% 96% 97%  Weight:      Height:  Intake/Output Summary (Last 24 hours) at 11/10/2020 1941 Last data filed at 11/10/2020 1819 Gross per 24 hour  Intake 580 ml  Output 700 ml  Net -120 ml   Filed Weights   11/01/20 0032 11/01/20 0623 11/06/20 2058  Weight: (!) 191.9 kg (!) 193.2 kg (!) 193.2 kg    Examination: General exam: Appears comfortable , morbidly obese HEENT: PERRLA, oral mucosa moist, no sclera icterus or thrush Respiratory system: Clear to auscultation. Respiratory effort normal. Cardiovascular system: S1 & S2 heard, regular rate and rhythm Gastrointestinal system: Abdomen soft, non-tender, +BS Increased truncal adiposity Central nervous system: Alert and oriented. No focal neurological deficits. Extremities: No cyanosis, clubbing or edema Skin: No rashes or  ulcers Psychiatry:  Mood & affect appropriate.      Data Reviewed: I have personally reviewed following labs and imaging studies  CBC: Recent Labs  Lab 11/05/20 0217  WBC 7.3  NEUTROABS 4.0  HGB 10.1*  HCT 34.8*  MCV 87.2  PLT [id number]*   Basic Metabolic Panel: Recent Labs  Lab 11/05/20 0217 11/07/20 0521  NA 137  --   K 4.0  --   CL 103  --   CO2 28  --   GLUCOSE 84  --   BUN 9  --   CREATININE 1.21* 1.16*  CALCIUM 8.6*  --    GFR: Estimated Creatinine Clearance: 108.4 mL/min (A) (by C-G formula based on SCr of 1.16 mg/dL (H)). Liver Function Tests: No results for input(s): AST, ALT, ALKPHOS, BILITOT, PROT, ALBUMIN in the last 168 hours. No results for input(s): LIPASE, AMYLASE in the last 168 hours. No results for input(s): AMMONIA in the last 168 hours. Coagulation Profile: No results for input(s): INR, PROTIME in the last 168 hours. Cardiac Enzymes: No results for input(s): CKTOTAL, CKMB, CKMBINDEX, TROPONINI in the last 168 hours. BNP (last 3 results) No results for input(s): PROBNP in the last 8760 hours. HbA1C: No results for input(s): HGBA1C in the last 72 hours. CBG: No results for input(s): GLUCAP in the last 168 hours. Lipid Profile: No results for input(s): CHOL, HDL, LDLCALC, TRIG, CHOLHDL, LDLDIRECT in the last 72 hours. Thyroid Function Tests: No results for input(s): TSH, T4TOTAL, FREET4, T3FREE, THYROIDAB in the last 72 hours. Anemia Panel: No results for input(s): VITAMINB12, FOLATE, FERRITIN, TIBC, IRON, RETICCTPCT in the last 72 hours. Urine analysis:    Component Value Date/Time   COLORURINE AMBER (A) 10/31/2020 1534   APPEARANCEUR CLOUDY (A) 10/31/2020 1534   APPEARANCEUR Cloudy (A) 08/06/2016 1209   LABSPEC 1.028 10/31/2020 1534   PHURINE 6.0 10/31/2020 1534   GLUCOSEU NEGATIVE 10/31/2020 1534   HGBUR MODERATE (A) 10/31/2020 1534   HGBUR large 04/19/2008 0952   BILIRUBINUR NEGATIVE 10/31/2020 1534   BILIRUBINUR Negative 08/06/2016  1209   KETONESUR NEGATIVE 10/31/2020 1534   PROTEINUR 30 (A) 10/31/2020 1534   UROBILINOGEN 0.2 12/06/2014 2049   NITRITE POSITIVE (A) 10/31/2020 1534   LEUKOCYTESUR LARGE (A) 10/31/2020 1534   Sepsis Labs: '@LABRCNTIP'$ (procalcitonin:4,lacticidven:4) ) No results found for this or any previous visit (from the past 240 hour(s)).     Radiology Studies: No results found.    Scheduled Meds:  ascorbic acid  500 mg Oral Daily   buPROPion ER  200 mg Oral BID   clotrimazole   Topical BID   enoxaparin (LOVENOX) injection  100 mg Subcutaneous Q24H   fluticasone  2 spray Each Nare BID   gabapentin  200 mg Oral TID   montelukast  10 mg  Oral QHS   nystatin   Topical BID   pantoprazole  40 mg Oral Daily   rOPINIRole  4 mg Oral QHS   senna-docusate  2 tablet Oral Daily   sodium chloride flush  3 mL Intravenous Q12H   traZODone  50 mg Oral QHS   Continuous Infusions:  sodium chloride       LOS: 9 days   Roxan Hockey, MD [facility] Pager: [email or web address] 11/10/2020, 7:41 PM

## 2020-11-11 DIAGNOSIS — E876 Hypokalemia: Secondary | ICD-10-CM | POA: Diagnosis not present

## 2020-11-11 DIAGNOSIS — F329 Major depressive disorder, single episode, unspecified: Secondary | ICD-10-CM | POA: Diagnosis not present

## 2020-11-11 DIAGNOSIS — N183 Chronic kidney disease, stage 3 unspecified: Secondary | ICD-10-CM | POA: Diagnosis not present

## 2020-11-11 LAB — CBC
HCT: 32.8 % — ABNORMAL LOW (ref 36.0–46.0)
Hemoglobin: 9.8 g/dL — ABNORMAL LOW (ref 12.0–15.0)
MCH: 25.8 pg — ABNORMAL LOW (ref 26.0–34.0)
MCHC: 29.9 g/dL — ABNORMAL LOW (ref 30.0–36.0)
MCV: 86.3 fL (ref 80.0–100.0)
Platelets: 389 10*3/uL (ref 150–400)
RBC: 3.8 MIL/uL — ABNORMAL LOW (ref 3.87–5.11)
RDW: 18.6 % — ABNORMAL HIGH (ref 11.5–15.5)
WBC: 8.9 10*3/uL (ref 4.0–10.5)
nRBC: 0 % (ref 0.0–0.2)

## 2020-11-11 LAB — BASIC METABOLIC PANEL
Anion gap: 6 (ref 5–15)
BUN: 16 mg/dL (ref 6–20)
CO2: 29 mmol/L (ref 22–32)
Calcium: 9 mg/dL (ref 8.9–10.3)
Chloride: 102 mmol/L (ref 98–111)
Creatinine, Ser: 1.22 mg/dL — ABNORMAL HIGH (ref 0.44–1.00)
GFR, Estimated: 57 mL/min — ABNORMAL LOW (ref >60)
Glucose, Bld: 84 mg/dL (ref 70–99)
Potassium: 3.7 mmol/L (ref 3.5–5.1)
Sodium: 137 mmol/L (ref 135–145)

## 2020-11-11 NOTE — Plan of Care (Signed)
  Problem: Health Behavior/Discharge Planning: Goal: Ability to manage health-related needs will improve Outcome: Progressing   Problem: Skin Integrity: Goal: Risk for impaired skin integrity will decrease Outcome: Progressing   Problem: Health Behavior/Discharge (Transition) Planning: Goal: Ability to manage health-related needs will improve Outcome: Progressing   Problem: Medication Management: Goal: Adhere to prescribed medication regimen Outcome: Progressing

## 2020-11-11 NOTE — Progress Notes (Signed)
PROGRESS NOTE    Heather [name]   [id number]  DOB: 02-05-1976  DOA: 10/30/2020 PCP: Lucianne Lei, MD   Brief Narrative:  Heather [name] is a 45 year old female with morbid obesity, obstructive sleep apnea, COPD, MDD, GAD, insomnia and RLS, diabetes mellitus type 2,renal mass,  and numerous small wounds under her pannus, upper thighs and between her legs. The patient was discharged from [facility] on 5/13 where she was admitted since 5/4 for a fall. Severe weakness. She was discharged to CIR.  She returned on 7/26 for pain in right flank and suprapubic area along with nausea vomiting. She was started on treatment for a complicated UTI.  She is now waiting on a SNF  Subjective: No complaints    Assessment & Plan:   Principal Problem:   Complicated UTI (urinary tract infection) - later felt to be colonization- received 3 days of Cefepime  Active Problems: Nephrolithiasis - follow  Suicidal ideation- anxiety and depression - cleared by psychiatry - cont Wellbutrin, Trazodone and Gabapentin    CKD (chronic kidney disease), stage III ([place]) - stable  Panniculitis - significant improved  Asthma - stable  Homeless - very weak- needs SNF  Body mass index is 75.46 kg/m.   Time spent in minutes: 35 DVT prophylaxis: Lovenox Code Status: Full code Family Communication:  Level of Care: Level of care: Telemetry Medical Disposition Plan:  Status is: Inpatient  Remains inpatient appropriate because:Unsafe d/c plan  Dispo: The patient is from:  homeless               Anticipated d/c is to: SNF              Patient currently is medically stable to d/c.   Difficult to place patient Yes      Consultants:  none Procedures:  none Antimicrobials:  Anti-infectives (From admission, onward)    Start     Dose/Rate Route Frequency Ordered Stop   11/01/20 1000  ceFEPIme (MAXIPIME) 2 g in sodium chloride 0.9 % 100 mL IVPB        2 g 200 mL/hr over 30  Minutes Intravenous Every 12 hours 10/31/20 1550 11/03/20 2309   11/01/20 0600  ceFEPIme (MAXIPIME) 2 g in sodium chloride 0.9 % 100 mL IVPB  Status:  Discontinued        2 g 200 mL/hr over 30 Minutes Intravenous Every 8 hours 10/31/20 1541 10/31/20 1550   10/31/20 1000  ertapenem (INVANZ) 1,000 mg in sodium chloride 0.9 % 100 mL IVPB  Status:  Discontinued        1 g 200 mL/hr over 30 Minutes Intravenous Every 24 hours 10/31/20 0948 10/31/20 1541        Objective: Vitals:   11/10/20 1755 11/10/20 2039 11/11/20 0618 11/11/20 0917  BP: 137/70 120/65 (!) 111/59 (!) 107/58  Pulse: 87 87 76 78  Resp: '18 20 18 18  '$ Temp: 98.2 F (36.8 C) 98.1 F (36.7 C) 98.1 F (36.7 C) 98.1 F (36.7 C)  TempSrc: Oral Oral Oral Oral  SpO2: 97% 97% 95% 96%  Weight:      Height:        Intake/Output Summary (Last 24 hours) at 11/11/2020 1619 Last data filed at 11/11/2020 1249 Gross per 24 hour  Intake 1052 ml  Output 0 ml  Net 1052 ml    Filed Weights   11/01/20 0032 11/01/20 0623 11/06/20 2058  Weight: (!) 191.9 kg (!) 193.2 kg (!) 193.2 kg  Examination: General exam: Appears comfortable  HEENT: PERRLA, oral mucosa moist, no sclera icterus or thrush Respiratory system: Clear to auscultation. Respiratory effort normal. Cardiovascular system: S1 & S2 heard, regular rate and rhythm Gastrointestinal system: Abdomen soft, non-tender, nondistended. Normal bowel sounds   Central nervous system: Alert and oriented. No focal neurological deficits. Extremities: No cyanosis, clubbing or edema Skin: No rashes or ulcers Psychiatry:  Mood & affect appropriate.      Data Reviewed: I have personally reviewed following labs and imaging studies  CBC: Recent Labs  Lab 11/05/20 0217 11/11/20 0417  WBC 7.3 8.9  NEUTROABS 4.0  --   HGB 10.1* 9.8*  HCT 34.8* 32.8*  MCV 87.2 86.3  PLT 411* [id number]    Basic Metabolic Panel: Recent Labs  Lab 11/05/20 0217 11/07/20 0521 11/11/20 0417  NA 137  --   137  K 4.0  --  3.7  CL 103  --  102  CO2 28  --  29  GLUCOSE 84  --  84  BUN 9  --  16  CREATININE 1.21* 1.16* 1.22*  CALCIUM 8.6*  --  9.0    GFR: Estimated Creatinine Clearance: 103.1 mL/min (A) (by C-G formula based on SCr of 1.22 mg/dL (H)). Liver Function Tests: No results for input(s): AST, ALT, ALKPHOS, BILITOT, PROT, ALBUMIN in the last 168 hours. No results for input(s): LIPASE, AMYLASE in the last 168 hours. No results for input(s): AMMONIA in the last 168 hours. Coagulation Profile: No results for input(s): INR, PROTIME in the last 168 hours. Cardiac Enzymes: No results for input(s): CKTOTAL, CKMB, CKMBINDEX, TROPONINI in the last 168 hours. BNP (last 3 results) No results for input(s): PROBNP in the last 8760 hours. HbA1C: No results for input(s): HGBA1C in the last 72 hours. CBG: No results for input(s): GLUCAP in the last 168 hours. Lipid Profile: No results for input(s): CHOL, HDL, LDLCALC, TRIG, CHOLHDL, LDLDIRECT in the last 72 hours. Thyroid Function Tests: No results for input(s): TSH, T4TOTAL, FREET4, T3FREE, THYROIDAB in the last 72 hours. Anemia Panel: No results for input(s): VITAMINB12, FOLATE, FERRITIN, TIBC, IRON, RETICCTPCT in the last 72 hours. Urine analysis:    Component Value Date/Time   COLORURINE AMBER (A) 10/31/2020 1534   APPEARANCEUR CLOUDY (A) 10/31/2020 1534   APPEARANCEUR Cloudy (A) 08/06/2016 1209   LABSPEC 1.028 10/31/2020 1534   PHURINE 6.0 10/31/2020 1534   GLUCOSEU NEGATIVE 10/31/2020 1534   HGBUR MODERATE (A) 10/31/2020 1534   HGBUR large 04/19/2008 0952   BILIRUBINUR NEGATIVE 10/31/2020 1534   BILIRUBINUR Negative 08/06/2016 1209   KETONESUR NEGATIVE 10/31/2020 1534   PROTEINUR 30 (A) 10/31/2020 1534   UROBILINOGEN 0.2 12/06/2014 2049   NITRITE POSITIVE (A) 10/31/2020 1534   LEUKOCYTESUR LARGE (A) 10/31/2020 1534   Sepsis Labs: '@LABRCNTIP'$ (procalcitonin:4,lacticidven:4) ) No results found for this or any previous  visit (from the past 240 hour(s)).        Radiology Studies: No results found.    Scheduled Meds:  ascorbic acid  500 mg Oral Daily   buPROPion ER  200 mg Oral BID   calcium carbonate  400 mg of elemental calcium Oral BID WC   clotrimazole   Topical BID   enoxaparin (LOVENOX) injection  100 mg Subcutaneous Q24H   fluticasone  2 spray Each Nare BID   gabapentin  200 mg Oral TID   montelukast  10 mg Oral QHS   nystatin   Topical BID   pantoprazole  40 mg Oral Daily  rOPINIRole  4 mg Oral QHS   senna-docusate  2 tablet Oral Daily   sodium chloride flush  3 mL Intravenous Q12H   traZODone  50 mg Oral QHS   Continuous Infusions:  sodium chloride       LOS: 10 days      Debbe Odea, MD [facility] Pager: [email or web address] 11/11/2020, 4:19 PM

## 2020-11-11 NOTE — Progress Notes (Signed)
Patient began to complain of chest tightness and rated it as a 9. Vital signs obtained and stable. Patient also complains of mild SOB. Patient denied any other abnormal symptoms. Order nitroglycerin given to patient, oxygen applied, and ordered albuterol also given to patient. EKG obtained. MD notified and made aware. Rapid Response RN made aware as well. Patient stated that five minutes after the first nitroglycerin, her pain improved and now rates as a 5. Vital signs re-checked and stable. Second nitroglycerin given. Patient reports improvement of symptoms with medication and other measures performed.

## 2020-11-11 NOTE — Plan of Care (Signed)
  Problem: Health Behavior/Discharge Planning: Goal: Ability to manage health-related needs will improve Outcome: Progressing   Problem: Skin Integrity: Goal: Risk for impaired skin integrity will decrease Outcome: Progressing   Problem: Education: Goal: Knowledge of warning signs, risks, and behaviors that relate to suicide ideation and self-harm behaviors will improve Outcome: Progressing   Problem: Health Behavior/Discharge (Transition) Planning: Goal: Ability to manage health-related needs will improve Outcome: Progressing   Problem: Clinical Measurements: Goal: Remain free from any harm during hospitalization Outcome: Progressing   Problem: Medication Management: Goal: Adhere to prescribed medication regimen 11/11/2020 0210 by Jacqualine Code, RN Outcome: Progressing 11/11/2020 0206 by Jacqualine Code, RN Outcome: Progressing

## 2020-11-11 NOTE — Progress Notes (Signed)
   11/11/20 1340  Clinical Encounter Type  Visited With Patient  Visit Type Initial;Spiritual support;Psychological support  Referral From Chaplain  Consult/Referral To [facility] responded to referral. Pt opened about her multifaceted struggles. Chaplain encouraged Pt in her goals regarding family and health. Chaplain engaged active listening, provided emotional support and prayed with Pt. Chaplain remains available.   This note was prepared by Chaplain Resident, Dante Gang, MDiv. Chaplain remains available as needed through the on-call pager: [phone].

## 2020-11-12 DIAGNOSIS — F329 Major depressive disorder, single episode, unspecified: Secondary | ICD-10-CM | POA: Diagnosis not present

## 2020-11-12 DIAGNOSIS — N183 Chronic kidney disease, stage 3 unspecified: Secondary | ICD-10-CM | POA: Diagnosis not present

## 2020-11-12 DIAGNOSIS — E876 Hypokalemia: Secondary | ICD-10-CM | POA: Diagnosis not present

## 2020-11-12 MED ORDER — CLONAZEPAM 0.25 MG PO TBDP
0.2500 mg | ORAL_TABLET | Freq: Two times a day (BID) | ORAL | Status: DC | PRN
  Administered 2020-11-13 – 2020-12-31 (×25): 0.25 mg via ORAL
  Filled 2020-11-12 (×26): qty 1

## 2020-11-12 NOTE — Progress Notes (Signed)
PROGRESS NOTE    Heather [name]   [id number]  DOB: 1976-02-14  DOA: 10/30/2020 PCP: Lucianne Lei, MD   Brief Narrative:  Heather [name] is a 45 year old female with morbid obesity, obstructive sleep apnea, COPD, MDD, GAD, insomnia and RLS, diabetes mellitus type 2,renal mass,  and numerous small wounds under her pannus, upper thighs and between her legs. The patient was discharged from [facility] on 5/13 where she was admitted since 5/4 for a fall. Severe weakness. She was discharged to CIR.  She returned on 7/26 for pain in right flank and suprapubic area along with nausea vomiting. She was started on treatment for a complicated UTI.  She is now waiting on a SNF  Subjective: She states that her chest pain yesterday was likely due to anxiety.   Assessment & Plan:   Principal Problem:   Complicated UTI (urinary tract infection) - later felt to be colonization- received 3 days of Cefepime  Active Problems: Nephrolithiasis - follow  Suicidal ideation- anxiety and depression - cleared by psychiatry - cont Wellbutrin, Trazodone and Gabapentin - add Clonazepam PRN today as she is having panic attacks with chest pain - this is related to losing custody of her twins.    CKD (chronic kidney disease), stage III (HCC) - stable  Panniculitis - significant improved  Asthma - stable  Homeless - very weak- needs SNF  Body mass index is 75.46 kg/m.   Time spent in minutes: 35 DVT prophylaxis: Lovenox Code Status: Full code Family Communication:  Level of Care: Level of care: Telemetry Medical Disposition Plan:  Status is: Inpatient  Remains inpatient appropriate because:Unsafe d/c plan  Dispo: The patient is from:  homeless               Anticipated d/c is to: SNF              Patient currently is medically stable to d/c.   Difficult to place patient Yes  Consultants:  none Procedures:  none Antimicrobials:  Anti-infectives (From admission, onward)     Start     Dose/Rate Route Frequency Ordered Stop   11/01/20 1000  ceFEPIme (MAXIPIME) 2 g in sodium chloride 0.9 % 100 mL IVPB        2 g 200 mL/hr over 30 Minutes Intravenous Every 12 hours 10/31/20 1550 11/03/20 2309   11/01/20 0600  ceFEPIme (MAXIPIME) 2 g in sodium chloride 0.9 % 100 mL IVPB  Status:  Discontinued        2 g 200 mL/hr over 30 Minutes Intravenous Every 8 hours 10/31/20 1541 10/31/20 1550   10/31/20 1000  ertapenem (INVANZ) 1,000 mg in sodium chloride 0.9 % 100 mL IVPB  Status:  Discontinued        1 g 200 mL/hr over 30 Minutes Intravenous Every 24 hours 10/31/20 0948 10/31/20 1541        Objective: Vitals:   11/11/20 1851 11/11/20 1903 11/11/20 2102 11/12/20 0528  BP: 118/66 111/62 114/67 118/70  Pulse: 87 89 86 69  Resp:  (!) '21 18 18  '$ Temp:   98.4 F (36.9 C) 98.4 F (36.9 C)  TempSrc:   Oral   SpO2:  98% 94% 98%  Weight:      Height:        Intake/Output Summary (Last 24 hours) at 11/12/2020 1425 Last data filed at 11/12/2020 1300 Gross per 24 hour  Intake 825 ml  Output 2 ml  Net 823 ml    Filed  Weights   11/01/20 0032 11/01/20 0623 11/06/20 2058  Weight: (!) 191.9 kg (!) 193.2 kg (!) 193.2 kg    Examination: General exam: Appears comfortable  HEENT: PERRLA, oral mucosa moist, no sclera icterus or thrush Respiratory system: Clear to auscultation. Respiratory effort normal. Cardiovascular system: S1 & S2 heard, regular rate and rhythm Gastrointestinal system: Abdomen soft, non-tender, nondistended. Normal bowel sounds   Central nervous system: Alert and oriented. No focal neurological deficits. Extremities: No cyanosis, clubbing or edema Skin: No rashes or ulcers Psychiatry:  Mood & affect appropriate.      Data Reviewed: I have personally reviewed following labs and imaging studies  CBC: Recent Labs  Lab 11/11/20 0417  WBC 8.9  HGB 9.8*  HCT 32.8*  MCV 86.3  PLT [id number]    Basic Metabolic Panel: Recent Labs  Lab  11/07/20 0521 11/11/20 0417  NA  --  137  K  --  3.7  CL  --  102  CO2  --  29  GLUCOSE  --  84  BUN  --  16  CREATININE 1.16* 1.22*  CALCIUM  --  9.0    GFR: Estimated Creatinine Clearance: 103.1 mL/min (A) (by C-G formula based on SCr of 1.22 mg/dL (H)). Liver Function Tests: No results for input(s): AST, ALT, ALKPHOS, BILITOT, PROT, ALBUMIN in the last 168 hours. No results for input(s): LIPASE, AMYLASE in the last 168 hours. No results for input(s): AMMONIA in the last 168 hours. Coagulation Profile: No results for input(s): INR, PROTIME in the last 168 hours. Cardiac Enzymes: No results for input(s): CKTOTAL, CKMB, CKMBINDEX, TROPONINI in the last 168 hours. BNP (last 3 results) No results for input(s): PROBNP in the last 8760 hours. HbA1C: No results for input(s): HGBA1C in the last 72 hours. CBG: No results for input(s): GLUCAP in the last 168 hours. Lipid Profile: No results for input(s): CHOL, HDL, LDLCALC, TRIG, CHOLHDL, LDLDIRECT in the last 72 hours. Thyroid Function Tests: No results for input(s): TSH, T4TOTAL, FREET4, T3FREE, THYROIDAB in the last 72 hours. Anemia Panel: No results for input(s): VITAMINB12, FOLATE, FERRITIN, TIBC, IRON, RETICCTPCT in the last 72 hours. Urine analysis:    Component Value Date/Time   COLORURINE AMBER (A) 10/31/2020 1534   APPEARANCEUR CLOUDY (A) 10/31/2020 1534   APPEARANCEUR Cloudy (A) 08/06/2016 1209   LABSPEC 1.028 10/31/2020 1534   PHURINE 6.0 10/31/2020 1534   GLUCOSEU NEGATIVE 10/31/2020 1534   HGBUR MODERATE (A) 10/31/2020 1534   HGBUR large 04/19/2008 0952   BILIRUBINUR NEGATIVE 10/31/2020 1534   BILIRUBINUR Negative 08/06/2016 1209   KETONESUR NEGATIVE 10/31/2020 1534   PROTEINUR 30 (A) 10/31/2020 1534   UROBILINOGEN 0.2 12/06/2014 2049   NITRITE POSITIVE (A) 10/31/2020 1534   LEUKOCYTESUR LARGE (A) 10/31/2020 1534   Sepsis Labs: '@LABRCNTIP'$ (procalcitonin:4,lacticidven:4) ) No results found for this or any  previous visit (from the past 240 hour(s)).        Radiology Studies: No results found.    Scheduled Meds:  ascorbic acid  500 mg Oral Daily   buPROPion ER  200 mg Oral BID   calcium carbonate  400 mg of elemental calcium Oral BID WC   clotrimazole   Topical BID   enoxaparin (LOVENOX) injection  100 mg Subcutaneous Q24H   fluticasone  2 spray Each Nare BID   gabapentin  200 mg Oral TID   montelukast  10 mg Oral QHS   nystatin   Topical BID   pantoprazole  40 mg Oral Daily  rOPINIRole  4 mg Oral QHS   senna-docusate  2 tablet Oral Daily   sodium chloride flush  3 mL Intravenous Q12H   traZODone  50 mg Oral QHS   Continuous Infusions:  sodium chloride       LOS: 11 days      Debbe Odea, MD [facility] Pager: [email or web address] 11/12/2020, 2:25 PM

## 2020-11-13 DIAGNOSIS — N183 Chronic kidney disease, stage 3 unspecified: Secondary | ICD-10-CM | POA: Diagnosis not present

## 2020-11-13 DIAGNOSIS — F329 Major depressive disorder, single episode, unspecified: Secondary | ICD-10-CM | POA: Diagnosis not present

## 2020-11-13 DIAGNOSIS — E876 Hypokalemia: Secondary | ICD-10-CM | POA: Diagnosis not present

## 2020-11-13 NOTE — Plan of Care (Signed)
  Problem: Activity: Goal: Risk for activity intolerance will decrease Outcome: Progressing   

## 2020-11-13 NOTE — Progress Notes (Signed)
PROGRESS NOTE    Heather [name]   [id number]  DOB: May 04, 1975  DOA: 10/30/2020 PCP: Lucianne Lei, MD   Brief Narrative:  Heather [name] is a 45 year old female with morbid obesity, obstructive sleep apnea, COPD, MDD, GAD, insomnia and RLS, diabetes mellitus type 2,renal mass,  and numerous small wounds under her pannus, upper thighs and between her legs. The patient was discharged from [facility] on 5/13 where she was admitted since 5/4 for a fall. Severe weakness. She was discharged to CIR.  She returned on 7/26 for pain in right flank and suprapubic area along with nausea vomiting. She was started on treatment for a complicated UTI.  She is now waiting on a SNF  Subjective: Continues to have  on and off chest pain Is very upset about possibly losing custody of her children. Noted that she had a fever last night and admits to a mild cough. No dysuria.   Assessment & Plan:   Principal Problem:   Complicated UTI (urinary tract infection) - later felt to be colonization- received 3 days of Cefepime  Active Problems: Low grade fever 8/7  - 100.3- follow closely- has mild cough but no dysuria-   Nephrolithiasis - follow  Suicidal ideation- anxiety and depression - cleared by psychiatry - cont Wellbutrin, Trazodone and Gabapentin - add Clonazepam PRN today as she is having panic attacks with chest pain - this is related to losing custody of her twins.    CKD (chronic kidney disease), stage III (HCC) - stable  Panniculitis - significant improved  Asthma - stable  Homeless - very weak- needs SNF  Body mass index is 75.46 kg/m.   Time spent in minutes: 35 DVT prophylaxis: Lovenox Code Status: Full code Family Communication:  Level of Care: Level of care: Telemetry Medical Disposition Plan:  Status is: Inpatient  Remains inpatient appropriate because:Unsafe d/c plan  Dispo: The patient is from:  homeless               Anticipated d/c is to: SNF               Patient currently is medically stable to d/c.   Difficult to place patient Yes  Consultants:  none Procedures:  none Antimicrobials:  Anti-infectives (From admission, onward)    Start     Dose/Rate Route Frequency Ordered Stop   11/01/20 1000  ceFEPIme (MAXIPIME) 2 g in sodium chloride 0.9 % 100 mL IVPB        2 g 200 mL/hr over 30 Minutes Intravenous Every 12 hours 10/31/20 1550 11/03/20 2309   11/01/20 0600  ceFEPIme (MAXIPIME) 2 g in sodium chloride 0.9 % 100 mL IVPB  Status:  Discontinued        2 g 200 mL/hr over 30 Minutes Intravenous Every 8 hours 10/31/20 1541 10/31/20 1550   10/31/20 1000  ertapenem (INVANZ) 1,000 mg in sodium chloride 0.9 % 100 mL IVPB  Status:  Discontinued        1 g 200 mL/hr over 30 Minutes Intravenous Every 24 hours 10/31/20 0948 10/31/20 1541        Objective: Vitals:   11/12/20 2152 11/12/20 2157 11/13/20 0530 11/13/20 0948  BP: 116/61 123/65 (!) 110/58 119/60  Pulse: 82 92 73 78  Resp: '17 19 18 18  '$ Temp: 98.6 F (37 C) (!) 100.7 F (38.2 C) 98.4 F (36.9 C) 97.8 F (36.6 C)  TempSrc: Oral Oral  Oral  SpO2: 100% 94% 93% 98%  Weight:  Height:        Intake/Output Summary (Last 24 hours) at 11/13/2020 1635 Last data filed at 11/13/2020 1306 Gross per 24 hour  Intake 830 ml  Output 1375 ml  Net -545 ml    Filed Weights   11/01/20 0032 11/01/20 0623 11/06/20 2058  Weight: (!) 191.9 kg (!) 193.2 kg (!) 193.2 kg    Examination: General exam: Appears comfortable  HEENT: PERRLA, oral mucosa moist, no sclera icterus or thrush Respiratory system: Clear to auscultation. Respiratory effort normal. Cardiovascular system: S1 & S2 heard, regular rate and rhythm Gastrointestinal system: Abdomen soft, non-tender, nondistended. Normal bowel sounds   Central nervous system: Alert and oriented. No focal neurological deficits. Extremities: No cyanosis, clubbing or edema Skin: No rashes or ulcers Psychiatry:  Mood & affect  appropriate.      Data Reviewed: I have personally reviewed following labs and imaging studies  CBC: Recent Labs  Lab 11/11/20 0417  WBC 8.9  HGB 9.8*  HCT 32.8*  MCV 86.3  PLT [id number]    Basic Metabolic Panel: Recent Labs  Lab 11/07/20 0521 11/11/20 0417  NA  --  137  K  --  3.7  CL  --  102  CO2  --  29  GLUCOSE  --  84  BUN  --  16  CREATININE 1.16* 1.22*  CALCIUM  --  9.0    GFR: Estimated Creatinine Clearance: 103.1 mL/min (A) (by C-G formula based on SCr of 1.22 mg/dL (H)). Liver Function Tests: No results for input(s): AST, ALT, ALKPHOS, BILITOT, PROT, ALBUMIN in the last 168 hours. No results for input(s): LIPASE, AMYLASE in the last 168 hours. No results for input(s): AMMONIA in the last 168 hours. Coagulation Profile: No results for input(s): INR, PROTIME in the last 168 hours. Cardiac Enzymes: No results for input(s): CKTOTAL, CKMB, CKMBINDEX, TROPONINI in the last 168 hours. BNP (last 3 results) No results for input(s): PROBNP in the last 8760 hours. HbA1C: No results for input(s): HGBA1C in the last 72 hours. CBG: No results for input(s): GLUCAP in the last 168 hours. Lipid Profile: No results for input(s): CHOL, HDL, LDLCALC, TRIG, CHOLHDL, LDLDIRECT in the last 72 hours. Thyroid Function Tests: No results for input(s): TSH, T4TOTAL, FREET4, T3FREE, THYROIDAB in the last 72 hours. Anemia Panel: No results for input(s): VITAMINB12, FOLATE, FERRITIN, TIBC, IRON, RETICCTPCT in the last 72 hours. Urine analysis:    Component Value Date/Time   COLORURINE AMBER (A) 10/31/2020 1534   APPEARANCEUR CLOUDY (A) 10/31/2020 1534   APPEARANCEUR Cloudy (A) 08/06/2016 1209   LABSPEC 1.028 10/31/2020 1534   PHURINE 6.0 10/31/2020 1534   GLUCOSEU NEGATIVE 10/31/2020 1534   HGBUR MODERATE (A) 10/31/2020 1534   HGBUR large 04/19/2008 0952   BILIRUBINUR NEGATIVE 10/31/2020 1534   BILIRUBINUR Negative 08/06/2016 1209   KETONESUR NEGATIVE 10/31/2020 1534    PROTEINUR 30 (A) 10/31/2020 1534   UROBILINOGEN 0.2 12/06/2014 2049   NITRITE POSITIVE (A) 10/31/2020 1534   LEUKOCYTESUR LARGE (A) 10/31/2020 1534   Sepsis Labs: '@LABRCNTIP'$ (procalcitonin:4,lacticidven:4) ) No results found for this or any previous visit (from the past 240 hour(s)).        Radiology Studies: No results found.    Scheduled Meds:  ascorbic acid  500 mg Oral Daily   buPROPion ER  200 mg Oral BID   calcium carbonate  400 mg of elemental calcium Oral BID WC   clotrimazole   Topical BID   enoxaparin (LOVENOX) injection  100 mg Subcutaneous  Q24H   fluticasone  2 spray Each Nare BID   gabapentin  200 mg Oral TID   montelukast  10 mg Oral QHS   nystatin   Topical BID   pantoprazole  40 mg Oral Daily   rOPINIRole  4 mg Oral QHS   senna-docusate  2 tablet Oral Daily   sodium chloride flush  3 mL Intravenous Q12H   traZODone  50 mg Oral QHS   Continuous Infusions:  sodium chloride       LOS: 12 days      Debbe Odea, MD [facility] Pager: [email or web address] 11/13/2020, 4:35 PM

## 2020-11-13 NOTE — TOC Progression Note (Signed)
Transition of Care [facility]) - Progression Note    Patient Details  Name: Heather [name] MRN: [id number] Date of Birth: 08-07-1975  [facility] CM/SW Contact  [name], LCSW Phone Number: 11/13/2020, 5:48 PM  Clinical Narrative:  Talked with patient at bedside regarding LTC placement and no bed offers to date. Talked with patient again regarding her history receiving SSA and SSI. Ms. Leeman reported that she started getting SSA survivor benefits when she was 10 and this stopped at age 9. She appealed it and got her disability from age 10 ongoing. Ms. Mogan reported that she got married in 2016 and her SSI stopped in December 2017. In March 2020 her SSA was taken away, she lost her job and her husband left her. Ms. Schuknecht was able to pull up a letter from [facility] on her phone the indicated effective 07/2018 her SSA benefits before any deductions is $0.00. Her regular SSA payment is $0.00 and the benefits topped April 2020. This letter also included SSI information that beginning December 2017 her current SSI benefit is $0.00 and this payment amt may change from month to month if income or living situation changes and payments stopped 03/2016. The letter also indicated "We found that you became disabled under our rules 11/06/1989.  Ms. Fricker reported that she has a phone hearing with SSA on 8/10.   CSW informed patient that contact was made with the admissions staff person at [facility] and clinicals were sent to SNF, and follow-up will be mae with facility to determine if they can accept her.    Expected Discharge Plan: Skilled Nursing Facility Barriers to Discharge: [facility]), Homeless with medical needs (Homeless)  Expected Discharge Plan and Services Expected Discharge Plan: [place] In-house Referral: Clinical Social Work   Post Acute Care Choice: [facility] (Needs LTC bed) Living arrangements for the past  2 months: Homeless Expected Discharge Date: 11/02/20                                     Social Determinants of Health (SDOH) Interventions    Readmission Risk Interventions No flowsheet data found.

## 2020-11-13 NOTE — Plan of Care (Signed)
  Problem: Activity: Goal: Risk for activity intolerance will decrease Outcome: Progressing   Problem: Skin Integrity: Goal: Risk for impaired skin integrity will decrease Outcome: Progressing   Problem: Health Behavior/Discharge (Transition) Planning: Goal: Ability to manage health-related needs will improve Outcome: Progressing

## 2020-11-13 NOTE — Progress Notes (Signed)
Physical Therapy Treatment Patient Details Name: Heather [name] MRN: [id number] DOB: 20-Apr-1975 Today's Date: 11/13/2020    History of Present Illness 45 y.o. female presented to ED for chest pain and palpitations and feeling like her throat was closing while at her behavioral health facility for SI. Admitted 10/31/20 for treatment of complicated UTI PMH: anxiety and depression (hx of bipolar and schizoaffective disorder in chart in past), ACD, panniculitis, debility,GERD,  OSA not on cpap, chronic back pain.    PT Comments    Pt in bed with HoB slightly elevated eating lunch. Reiterated need to get out of bed to eat. Pt replies "I am sitting up" Pt reports she is not feeling well, she had a fever last night. Pt is min guard for bed mobility and modA for transfer and min Ax2 for 3 feet ambulation. D/c plans remain appropriate at this time. PT will continue to follow acutely.     Follow Up Recommendations  SNF     Equipment Recommendations  None recommended by PT (has RW, left BSC at hotel)    Recommendations for Other Services OT consult     Precautions / Restrictions Precautions Precautions: Fall Precaution Comments: fall prior to hospitalization Restrictions Weight Bearing Restrictions: No    Mobility  Bed Mobility Overal bed mobility: Needs Assistance Bed Mobility: Supine to Sit;Sit to Supine     Supine to sit: HOB elevated;Min guard     General bed mobility comments: bed deflated to improve mobility, HoB elevated and pt able to move to EoB with min guard for safety, increased effort to navigate air bed    Transfers Overall transfer level: Needs assistance Equipment used: Rolling walker (2 wheeled) Transfers: Sit to/from Stand Sit to Stand: +2 physical assistance;Mod assist;From elevated surface         General transfer comment: modAx2 to power up from bed,  Ambulation/Gait Ambulation/Gait assistance: +2 safety/equipment;Min assist;Min guard Gait Distance  (Feet): 3 Feet Assistive device: Rolling walker (2 wheeled) Gait Pattern/deviations: Decreased step length - right;Decreased step length - left;Wide base of support;Antalgic;Decreased weight shift to right;Step-through pattern Gait velocity: slowed Gait velocity interpretation: <1.31 ft/sec, indicative of household ambulator General Gait Details: only agreeable to walking 3 feet to chair         Balance Overall balance assessment: Needs assistance Sitting-balance support: Feet supported;No upper extremity supported Sitting balance-Leahy Scale: Fair     Standing balance support: Bilateral upper extremity supported;During functional activity Standing balance-Leahy Scale: Poor Standing balance comment: requires B UE assist to maintain standing                            Cognition Arousal/Alertness: Awake/alert Behavior During Therapy: WFL for tasks assessed/performed Overall Cognitive Status: Impaired/Different from baseline Area of Impairment: Safety/judgement;Problem solving                         Safety/Judgement: Decreased awareness of safety;Decreased awareness of deficits   Problem Solving: Requires verbal cues;Requires tactile cues General Comments: continues to have poor awareness of need for being out of bed to eat, stating she has the HoB up, also poor safety awareness         General Comments General comments (skin integrity, edema, etc.): VSS on RA      Pertinent Vitals/Pain Pain Assessment: Faces Faces Pain Scale: Hurts a little bit Pain Location: back and R knee Pain Descriptors / Indicators: Aching;Sharp;Shooting;Sore Pain Intervention(s): Limited activity  within patient's tolerance;Monitored during session;Repositioned     PT Goals (current goals can now be found in the care plan section) Acute Rehab PT Goals Patient Stated Goal: be able to get around on her own PT Goal Formulation: With patient Time For Goal Achievement:  11/16/20 Potential to Achieve Goals: Fair Progress towards PT goals: Not progressing toward goals - comment (had fever last night not feeling well)    Frequency    Min 2X/week      PT Plan Current plan remains appropriate       AM-PAC PT "6 Clicks" Mobility   Outcome Measure  Help needed turning from your back to your side while in a flat bed without using bedrails?: None Help needed moving from lying on your back to sitting on the side of a flat bed without using bedrails?: A Lot Help needed moving to and from a bed to a chair (including a wheelchair)?: Total Help needed standing up from a chair using your arms (e.g., wheelchair or bedside chair)?: Total Help needed to walk in hospital room?: Total Help needed climbing 3-5 steps with a railing? : Total 6 Click Score: 10    End of Session   Activity Tolerance: Patient limited by fatigue Patient left: with call bell/phone within reach;in chair;with chair alarm set Nurse Communication: Mobility status PT Visit Diagnosis: Unsteadiness on feet (R26.81);Other abnormalities of gait and mobility (R26.89);History of falling (Z91.81);Muscle weakness (generalized) (M62.81);Difficulty in walking, not elsewhere classified (R26.2);Pain Pain - Right/Left: Right Pain - part of body: Knee (back)     Time: 1211-1223 PT Time Calculation (min) (ACUTE ONLY): 12 min  Charges:  $Therapeutic Activity: 8-22 mins                     Torrin Frein B. Migdalia Dk PT, DPT Acute Rehabilitation Services Pager [phone] Office [phone]    [place] 11/13/2020, 12:54 PM

## 2020-11-14 DIAGNOSIS — F329 Major depressive disorder, single episode, unspecified: Secondary | ICD-10-CM | POA: Diagnosis not present

## 2020-11-14 DIAGNOSIS — E876 Hypokalemia: Secondary | ICD-10-CM | POA: Diagnosis not present

## 2020-11-14 DIAGNOSIS — N183 Chronic kidney disease, stage 3 unspecified: Secondary | ICD-10-CM | POA: Diagnosis not present

## 2020-11-14 LAB — CREATININE, SERUM
Creatinine, Ser: 1.29 mg/dL — ABNORMAL HIGH (ref 0.44–1.00)
GFR, Estimated: 53 mL/min — ABNORMAL LOW (ref >60)

## 2020-11-14 NOTE — Progress Notes (Signed)
   11/14/20 0935  Clinical Encounter Type  Visited With Patient  Visit Type Follow-up  Referral From Patient  Consult/Referral To Chaplain  Spiritual Encounters  Spiritual Needs Emotional;Literature (HCPOA)  Ms. Sasso HCPOA paperwork has been reviewed and ready for notary.  Chaplain scheduled an appointment with Kieth Brightly to have HCPOA notarized.     Chaplain Merced Hanners [place]  [phone]

## 2020-11-14 NOTE — Progress Notes (Signed)
Occupational Therapy Treatment Patient Details Name: Heather [name] MRN: [id number] DOB: 06-11-1975 Today's Date: 11/14/2020    History of present illness 45 y.o. female presented to ED for chest pain and palpitations and feeling like her throat was closing while at her behavioral health facility for SI. Admitted 10/31/20 for treatment of complicated UTI PMH: anxiety and depression (hx of bipolar and schizoaffective disorder in chart in past), ACD, panniculitis, debility,GERD,  OSA not on cpap, chronic back pain.   OT comments  Patient progressing and showed improved ability to mobilize to EOB, then out of bed to both 3-in-1 BSC and recliner with Min As of 1 person with use of RW compared to previous session where pt required assist of 2 people and increased external help. Patient remains limited by pain to lower back which prevents her from being up in chair as recommended, generalized weakness and decreased activity tolerance along with deficits noted below. Pt continues to demonstrate fair rehab potential and would benefit from continued skilled OT to increase safety and independence with ADLs and functional transfers to allow pt to return home safely and reduce caregiver burden and fall risk.   Follow Up Recommendations  SNF;Supervision/Assistance - 24 hour    Equipment Recommendations   (defer to post acute)    Recommendations for Other Services      Precautions / Restrictions Precautions Precautions: Fall Precaution Comments: fall prior to hospitalization Restrictions Weight Bearing Restrictions: No       Mobility Bed Mobility Overal bed mobility: Needs Assistance Bed Mobility: Supine to Sit     Supine to sit: HOB elevated;Min guard          Transfers Overall transfer level: Needs assistance Equipment used: Rolling walker (2 wheeled) Transfers: Sit to/from [facility] Sit to Stand: Min assist Stand pivot transfers: Min assist       General  transfer comment: Pt using RW to pivot from EOB->BSC->recliner with Min As.    Balance Overall balance assessment: Needs assistance Sitting-balance support: Feet supported;No upper extremity supported Sitting balance-Leahy Scale: Fair Sitting balance - Comments: Good in recliner. Fair on bariatric air bed.   Standing balance support: Bilateral upper extremity supported;During functional activity Standing balance-Leahy Scale: Poor Standing balance comment: requires B UE assist to maintain standing                           ADL either performed or assessed with clinical judgement   ADL Overall ADL's : Needs assistance/impaired     Grooming: Wash/dry hands;Sitting;Set up;Supervision/safety Grooming Details (indicate cue type and reason): Cues to initiate hand hygiene post toileting. Setup in chair.         Upper Body Dressing : Set up;Sitting Upper Body Dressing Details (indicate cue type and reason): Pt donned overhead gown from home with setup while in recliner. Lower Body Dressing: Total assistance;Bed level Lower Body Dressing Details (indicate cue type and reason): To don socks. Pt reports proficiency at use of reacher and sock aid at home. Toilet Transfer: Minimal assistance;BSC;RW;Stand-pivot Armed forces technical officer Details (indicate cue type and reason): Pt able to pivot to 3:1 BSC with RW and Min As. Toileting- Clothing Manipulation and Hygiene: Maximal assistance;Sit to/from stand Toileting - Clothing Manipulation Details (indicate cue type and reason): Pt able to initiate standing from [facility] and performed limited hygiene due to body habitus. Pt reports that she uses a device at home but it is now lost. Max As for thoroughness for posterior  hygiene.     Functional mobility during ADLs: Minimal assistance;Rolling walker       Vision   Vision Assessment?: No apparent visual deficits   Perception     Praxis      Cognition Arousal/Alertness: Awake/alert Behavior  During Therapy: Flat affect Overall Cognitive Status: Impaired/Different from baseline                           Safety/Judgement: Decreased awareness of safety;Decreased awareness of deficits     General Comments: Needs cues to mobilize or show effort. Distracted by phone. Possible decreased comprehension of need for activity to gain strength.        Exercises     Shoulder Instructions       General Comments      Pertinent Vitals/ Pain       Faces Pain Scale: Hurts a little bit Pain Location: back and R knee Pain Intervention(s): Limited activity within patient's tolerance (Pt agreed to stay in recliner for 30 min. Added pillows to lower back for comfort.)  Home Living                                          Prior Functioning/Environment              Frequency  Min 2X/week        Progress Toward Goals  OT Goals(current goals can now be found in the care plan section)  Progress towards OT goals: Progressing toward goals  Acute Rehab OT Goals Patient Stated Goal: be able to get around on her own OT Goal Formulation: With patient Time For Goal Achievement: 11/24/20 Potential to Achieve Goals: Trail Discharge plan remains appropriate    Co-evaluation                 AM-PAC OT "6 Clicks" Daily Activity     Outcome Measure   Help from another person eating meals?: None Help from another person taking care of personal grooming?: A Little Help from another person toileting, which includes using toliet, bedpan, or urinal?: A Lot Help from another person bathing (including washing, rinsing, drying)?: A Lot Help from another person to put on and taking off regular upper body clothing?: A Little Help from another person to put on and taking off regular lower body clothing?: Total 6 Click Score: 15    End of Session Equipment Utilized During Treatment: Rolling walker  OT Visit Diagnosis: Unsteadiness on feet  (R26.81);Other abnormalities of gait and mobility (R26.89);Muscle weakness (generalized) (M62.81);Pain;Other (comment) Pain - Right/Left: Right Pain - part of body: Knee (and LBP)   Activity Tolerance Patient tolerated treatment well   Patient Left with call bell/phone within reach;in chair;with chair alarm set   Nurse Communication          Time: [phone] OT Time Calculation (min): 36 min  Charges: OT General Charges $OT Visit: 1 Visit OT Treatments $Self Care/Home Management : 8-22 mins $Therapeutic Activity: 8-22 mins  Anderson [place], [place] Office: [phone] 11/14/2020   Julien Girt 11/14/2020, 2:24 PM

## 2020-11-14 NOTE — Progress Notes (Signed)
This chaplain joined the Pt., notary and two witnesses for the notarizing of the Pt. Advance Directive:  HCPOA and Living Will.  The Pt. named Mickie Hillier as her healthcare agent.  If this person is unable or unwilling to serve as the healthcare agent, the Pt. next choice is Kaylyn Lim.  The chaplain gave the Pt. the original AD along with two copies.  The chaplain placed a copy of the Pt. AD in her chart.  Spiritual care is available for F/U as needed.

## 2020-11-14 NOTE — TOC Progression Note (Addendum)
Transition of Care [facility]) - Progression Note    Patient Details  Name: Heather [name] MRN: [id number] Date of Birth: 06-17-1975  [facility] CM/SW Contact  [name], LCSW Phone Number: 11/14/2020, 12:20 PM  Clinical Narrative:  Patient was seen this morning by [facility] evaluator. Conferred with [facility] Director Nathaniel Man regarding patient and placement challenges. His recommendations included talking with PT/OT regarding seeing patient more often and contacting [facility] and [place] regarding patient.  [facility] contacted and message left for admissions director Monia Pouch. Talked with Hilda Blades, admissions director at [facility] and they don't take patient over 395-400 pounds. PT/OT office contacted and talked with Jarrett Soho regarding seeing patient more often. They currently see her twice a week and CSW asked if patient can be seen 3 times a week. She will check into.  12:33 pm: Talked with Crystal, admissions director at [facility] [phone]), following up with her regarding patient as clinicals were sent on 8/3. They currently don't have bariatric bed availability, however she requested that [facility] fax additional clinicals to her (fax [phone]). Clinicals faxed 12:58 pm.  Emailed financial counselor Toniann Ket regarding patient and assistance with getting her Medicaid changed to LTC Medicaid. Informed Jenny Reichmann regarding CSW that will be covering 46M the rest of the week.    Expected Discharge Plan: Skilled Nursing Facility Barriers to Discharge: [place] ([place]), Homeless with medical needs (Homeless)  Expected Discharge Plan and Services Expected Discharge Plan: [place] In-house Referral: Clinical Social Work   Post Acute Care Choice: [place] (Needs LTC bed) Living arrangements for the past 2 months: Homeless Expected Discharge Date: 11/02/20                                    Social Determinants of Health (SDOH) Interventions  Patient is homeless  Readmission Risk Interventions No flowsheet data found.

## 2020-11-14 NOTE — Progress Notes (Signed)
PROGRESS NOTE    Heather [name]   [id number]  DOB: 08-Feb-1976  DOA: 10/30/2020 PCP: Lucianne Lei, MD   Brief Narrative:  Heather [name] is a 45 year old female with morbid obesity, obstructive sleep apnea, COPD, MDD, GAD, insomnia and RLS, diabetes mellitus type 2,renal mass,  and numerous small wounds under her pannus, upper thighs and between her legs. The patient was discharged from [facility] on 5/13 where she was admitted since 5/4 for a fall. Severe weakness. She was discharged to CIR.  She returned on 7/26 for pain in right flank and suprapubic area along with nausea vomiting. She was started on treatment for a complicated UTI.  She is now waiting on a SNF  Subjective: She states she is only urinating 2 x a day and this is not enough. Is drinking lots of liquids.   Assessment & Plan:   Principal Problem:   Complicated UTI (urinary tract infection) - later felt to be colonization- received 3 days of Cefepime  Active Problems: Low grade fever 8/7  - 100.7- this has not recurred- follow closely- has mild cough but no dysuria-   Patient claims she is drinking 8 cups of fluid a day but not urinating enough - she thinks her abdomen has increased in girth and she is retaining fluid - follow I and O - I have asked the RN to document these  Nephrolithiasis - follow  Suicidal ideation- anxiety and depression - cleared by psychiatry - cont Wellbutrin, Trazodone and Gabapentin - add Clonazepam PRN today as she is having panic attacks with chest pain - this is related to losing custody of her twins.    CKD (chronic kidney disease), stage IIIb (HCC) - stable  Panniculitis - significant improved  Asthma - stable  Homeless - very weak- needs SNF  Body mass index is 75.46 kg/m.   Time spent in minutes: 35 DVT prophylaxis: Lovenox Code Status: Full code Family Communication:  Level of Care: Level of care: Telemetry Medical Disposition Plan:  Status is:  Inpatient  Remains inpatient appropriate because:Unsafe d/c plan  Dispo: The patient is from:  homeless               Anticipated d/c is to: SNF              Patient currently is medically stable to d/c.   Difficult to place patient Yes  Consultants:  none Procedures:  none Antimicrobials:  Anti-infectives (From admission, onward)    Start     Dose/Rate Route Frequency Ordered Stop   11/01/20 1000  ceFEPIme (MAXIPIME) 2 g in sodium chloride 0.9 % 100 mL IVPB        2 g 200 mL/hr over 30 Minutes Intravenous Every 12 hours 10/31/20 1550 11/03/20 2309   11/01/20 0600  ceFEPIme (MAXIPIME) 2 g in sodium chloride 0.9 % 100 mL IVPB  Status:  Discontinued        2 g 200 mL/hr over 30 Minutes Intravenous Every 8 hours 10/31/20 1541 10/31/20 1550   10/31/20 1000  ertapenem (INVANZ) 1,000 mg in sodium chloride 0.9 % 100 mL IVPB  Status:  Discontinued        1 g 200 mL/hr over 30 Minutes Intravenous Every 24 hours 10/31/20 0948 10/31/20 1541        Objective: Vitals:   11/13/20 1738 11/13/20 2055 11/14/20 0519 11/14/20 1002  BP: 122/60 (!) 109/58 110/64 114/63  Pulse: 86 85 72 78  Resp: '18 19 19 '$ 18  Temp: 98.3 F (36.8 C) 97.8 F (36.6 C) 97.8 F (36.6 C) 98.2 F (36.8 C)  TempSrc: Oral Oral Oral Oral  SpO2: 97% 96% 98% 93%  Weight:      Height:        Intake/Output Summary (Last 24 hours) at 11/14/2020 1703 Last data filed at 11/14/2020 1322 Gross per 24 hour  Intake 892 ml  Output 200 ml  Net 692 ml    Filed Weights   11/01/20 0032 11/01/20 0623 11/06/20 2058  Weight: (!) 191.9 kg (!) 193.2 kg (!) 193.2 kg    Examination: General exam: Appears comfortable  HEENT: PERRLA, oral mucosa moist, no sclera icterus or thrush Respiratory system: Clear to auscultation. Respiratory effort normal. Cardiovascular system: S1 & S2 heard, regular rate and rhythm Gastrointestinal system: Abdomen soft, non-tender, nondistended. Normal bowel sounds   Central nervous system: Alert  and oriented. No focal neurological deficits. Extremities: No cyanosis, clubbing or edema- specifically no edema noted Skin: No rashes or ulcers Psychiatry:  Mood & affect appropriate.      Data Reviewed: I have personally reviewed following labs and imaging studies  CBC: Recent Labs  Lab 11/11/20 0417  WBC 8.9  HGB 9.8*  HCT 32.8*  MCV 86.3  PLT [id number]    Basic Metabolic Panel: Recent Labs  Lab 11/11/20 0417 11/14/20 0435  NA 137  --   K 3.7  --   CL 102  --   CO2 29  --   GLUCOSE 84  --   BUN 16  --   CREATININE 1.22* 1.29*  CALCIUM 9.0  --     GFR: Estimated Creatinine Clearance: 97.5 mL/min (A) (by C-G formula based on SCr of 1.29 mg/dL (H)). Liver Function Tests: No results for input(s): AST, ALT, ALKPHOS, BILITOT, PROT, ALBUMIN in the last 168 hours. No results for input(s): LIPASE, AMYLASE in the last 168 hours. No results for input(s): AMMONIA in the last 168 hours. Coagulation Profile: No results for input(s): INR, PROTIME in the last 168 hours. Cardiac Enzymes: No results for input(s): CKTOTAL, CKMB, CKMBINDEX, TROPONINI in the last 168 hours. BNP (last 3 results) No results for input(s): PROBNP in the last 8760 hours. HbA1C: No results for input(s): HGBA1C in the last 72 hours. CBG: No results for input(s): GLUCAP in the last 168 hours. Lipid Profile: No results for input(s): CHOL, HDL, LDLCALC, TRIG, CHOLHDL, LDLDIRECT in the last 72 hours. Thyroid Function Tests: No results for input(s): TSH, T4TOTAL, FREET4, T3FREE, THYROIDAB in the last 72 hours. Anemia Panel: No results for input(s): VITAMINB12, FOLATE, FERRITIN, TIBC, IRON, RETICCTPCT in the last 72 hours. Urine analysis:    Component Value Date/Time   COLORURINE AMBER (A) 10/31/2020 1534   APPEARANCEUR CLOUDY (A) 10/31/2020 1534   APPEARANCEUR Cloudy (A) 08/06/2016 1209   LABSPEC 1.028 10/31/2020 1534   PHURINE 6.0 10/31/2020 1534   GLUCOSEU NEGATIVE 10/31/2020 1534   HGBUR MODERATE (A)  10/31/2020 1534   HGBUR large 04/19/2008 0952   BILIRUBINUR NEGATIVE 10/31/2020 1534   BILIRUBINUR Negative 08/06/2016 1209   KETONESUR NEGATIVE 10/31/2020 1534   PROTEINUR 30 (A) 10/31/2020 1534   UROBILINOGEN 0.2 12/06/2014 2049   NITRITE POSITIVE (A) 10/31/2020 1534   LEUKOCYTESUR LARGE (A) 10/31/2020 1534   Sepsis Labs: '@LABRCNTIP'$ (procalcitonin:4,lacticidven:4) ) No results found for this or any previous visit (from the past 240 hour(s)).        Radiology Studies: No results found.    Scheduled Meds:  ascorbic acid  500 mg  Oral Daily   buPROPion ER  200 mg Oral BID   calcium carbonate  400 mg of elemental calcium Oral BID WC   clotrimazole   Topical BID   enoxaparin (LOVENOX) injection  100 mg Subcutaneous Q24H   fluticasone  2 spray Each Nare BID   gabapentin  200 mg Oral TID   montelukast  10 mg Oral QHS   nystatin   Topical BID   pantoprazole  40 mg Oral Daily   rOPINIRole  4 mg Oral QHS   senna-docusate  2 tablet Oral Daily   sodium chloride flush  3 mL Intravenous Q12H   traZODone  50 mg Oral QHS   Continuous Infusions:  sodium chloride       LOS: 13 days      Debbe Odea, MD [facility] Pager: [email or web address] 11/14/2020, 5:03 PM

## 2020-11-15 DIAGNOSIS — N390 Urinary tract infection, site not specified: Secondary | ICD-10-CM | POA: Diagnosis not present

## 2020-11-15 DIAGNOSIS — N183 Chronic kidney disease, stage 3 unspecified: Secondary | ICD-10-CM | POA: Diagnosis not present

## 2020-11-15 NOTE — Progress Notes (Signed)
Patient ID: Heather [name], female   DOB: 1976-01-27, 45 y.o.   MRN: [id number]  PROGRESS NOTE    Heather [name]  [id number] DOB: [birth year] DOA: 10/30/2020 PCP: Lucianne Lei, MD   Brief Narrative:  45 year old female with morbid obesity, obstructive sleep apnea, COPD, MDD, GAD, insomnia and RLS, diabetes mellitus type 2,renal mass,  and numerous small wounds under her pannus, upper thighs and between her legs presented with right flank and suprapubic pain along with nausea and vomiting.  She was admitted and treated for complicated UTI with antibiotics and has completed treatment.  Currently medically stable for discharge to SNF.  Assessment & Plan:   Complicated UTI -Received 3 days of cefepime.  Later felt to be colonization.  Currently off antibiotics  Nephrolithiasis -Outpatient follow-up with urology  Suicidal ideation/anxiety and depression -Psychiatry has cleared the patient for discharge.  Continue Wellbutrin, trazodone and gabapentin.  Continue as needed clonazepam.  Outpatient follow-up with psychiatry  Chronically disease stage III B--creatinine stable  Generalized deconditioning Homelessness -TOC following.  Will need SNF placement.  Panniculitis -Significantly improved  Asthma -Stable  Morbid obesity -Outpatient follow-up    DVT prophylaxis: Lovenox Code Status: Full Family Communication: None Disposition Plan: Status is: Inpatient  Remains inpatient appropriate because:Unsafe d/c plan  Dispo: The patient is from: Homeless              Anticipated d/c is to: SNF              Patient currently is medically stable to d/c.   Difficult to place patient Yes  Consultants: Psychiatry  Procedures: None  Antimicrobials:  Anti-infectives (From admission, onward)    Start     Dose/Rate Route Frequency Ordered Stop   11/01/20 1000  ceFEPIme (MAXIPIME) 2 g in sodium chloride 0.9 % 100 mL IVPB        2 g 200 mL/hr over 30 Minutes Intravenous  Every 12 hours 10/31/20 1550 11/03/20 2309   11/01/20 0600  ceFEPIme (MAXIPIME) 2 g in sodium chloride 0.9 % 100 mL IVPB  Status:  Discontinued        2 g 200 mL/hr over 30 Minutes Intravenous Every 8 hours 10/31/20 1541 10/31/20 1550   10/31/20 1000  ertapenem (INVANZ) 1,000 mg in sodium chloride 0.9 % 100 mL IVPB  Status:  Discontinued        1 g 200 mL/hr over 30 Minutes Intravenous Every 24 hours 10/31/20 0948 10/31/20 1541        Subjective: Patient seen and examined at bedside.  Feels that she is urinating less.  Complains of mild shortness of breath.  No overnight fever or vomiting reported.  Objective: Vitals:   11/14/20 1002 11/14/20 1947 11/15/20 0509 11/15/20 0932  BP: 114/63 120/68 (!) 101/57 (!) 121/55  Pulse: 78 80 72 81  Resp: '18 18 18 18  '$ Temp: 98.2 F (36.8 C) 98.4 F (36.9 C) 98 F (36.7 C) 98 F (36.7 C)  TempSrc: Oral     SpO2: 93% 95% 97% 97%  Weight:      Height:        Intake/Output Summary (Last 24 hours) at 11/15/2020 1205 Last data filed at 11/15/2020 0816 Gross per 24 hour  Intake 536 ml  Output 1100 ml  Net -564 ml   Filed Weights   11/01/20 0032 11/01/20 0623 11/06/20 2058  Weight: (!) 191.9 kg (!) 193.2 kg (!) 193.2 kg    Examination:  General exam: Appears calm and comfortable.  Currently on room air.  Looks chronically ill and deconditioned Respiratory system: Bilateral decreased breath sounds at bases with scattered crackles Cardiovascular system: S1 & S2 heard, Rate controlled Gastrointestinal system: Abdomen is morbidly obese, nondistended, soft and nontender. Normal bowel sounds heard. Extremities: No cyanosis, clubbing; trace lower extremity edema present    Data Reviewed: I have personally reviewed following labs and imaging studies  CBC: Recent Labs  Lab 11/11/20 0417  WBC 8.9  HGB 9.8*  HCT 32.8*  MCV 86.3  PLT [id number]   Basic Metabolic Panel: Recent Labs  Lab 11/11/20 0417 11/14/20 0435  NA 137  --   K 3.7  --    CL 102  --   CO2 29  --   GLUCOSE 84  --   BUN 16  --   CREATININE 1.22* 1.29*  CALCIUM 9.0  --    GFR: Estimated Creatinine Clearance: 97.5 mL/min (A) (by C-G formula based on SCr of 1.29 mg/dL (H)). Liver Function Tests: No results for input(s): AST, ALT, ALKPHOS, BILITOT, PROT, ALBUMIN in the last 168 hours. No results for input(s): LIPASE, AMYLASE in the last 168 hours. No results for input(s): AMMONIA in the last 168 hours. Coagulation Profile: No results for input(s): INR, PROTIME in the last 168 hours. Cardiac Enzymes: No results for input(s): CKTOTAL, CKMB, CKMBINDEX, TROPONINI in the last 168 hours. BNP (last 3 results) No results for input(s): PROBNP in the last 8760 hours. HbA1C: No results for input(s): HGBA1C in the last 72 hours. CBG: No results for input(s): GLUCAP in the last 168 hours. Lipid Profile: No results for input(s): CHOL, HDL, LDLCALC, TRIG, CHOLHDL, LDLDIRECT in the last 72 hours. Thyroid Function Tests: No results for input(s): TSH, T4TOTAL, FREET4, T3FREE, THYROIDAB in the last 72 hours. Anemia Panel: No results for input(s): VITAMINB12, FOLATE, FERRITIN, TIBC, IRON, RETICCTPCT in the last 72 hours. Sepsis Labs: No results for input(s): PROCALCITON, LATICACIDVEN in the last 168 hours.  No results found for this or any previous visit (from the past 240 hour(s)).       Radiology Studies: No results found.      Scheduled Meds:  ascorbic acid  500 mg Oral Daily   buPROPion ER  200 mg Oral BID   calcium carbonate  400 mg of elemental calcium Oral BID WC   clotrimazole   Topical BID   enoxaparin (LOVENOX) injection  100 mg Subcutaneous Q24H   fluticasone  2 spray Each Nare BID   gabapentin  200 mg Oral TID   montelukast  10 mg Oral QHS   nystatin   Topical BID   pantoprazole  40 mg Oral Daily   rOPINIRole  4 mg Oral QHS   senna-docusate  2 tablet Oral Daily   sodium chloride flush  3 mL Intravenous Q12H   traZODone  50 mg Oral QHS    Continuous Infusions:  sodium chloride            Aline August, MD [facility] 11/15/2020, 12:05 PM

## 2020-11-15 NOTE — Progress Notes (Signed)
Physical Therapy Treatment Patient Details Name: Heather [name] MRN: [id number] DOB: [birth year] Today's Date: 11/15/2020    History of Present Illness 45 y.o. female presented to ED for chest pain and palpitations and feeling like her throat was closing while at her behavioral health facility for SI. Admitted 10/31/20 for treatment of complicated UTI PMH: anxiety and depression (hx of bipolar and schizoaffective disorder in chart in past), ACD, panniculitis, debility,GERD,  OSA not on cpap, chronic back pain.    PT Comments    Pt eager to get up with therapy to walk and requests PT put her shoes on to aid in walking. Pt is min guard for bed mobility, modA to get up from air bed and min guard to power up from recliner with use of armrests. Pt able to ambulate 2x40 feet with RW and min guard assist. Requested pt stay up in recliner until after her 11:00 phone meeting. Pt would benefit from short term rehab to improve her safe mobility. PT will continue to follow acutely.    Follow Up Recommendations  SNF     Equipment Recommendations  None recommended by PT (has RW, left BSC at hotel)    Recommendations for Other Services OT consult     Precautions / Restrictions Precautions Precautions: Fall Precaution Comments: fall prior to hospitalization Restrictions Weight Bearing Restrictions: No    Mobility  Bed Mobility Overal bed mobility: Needs Assistance Bed Mobility: Supine to Sit     Supine to sit: HOB elevated;Min guard     General bed mobility comments: bed deflated to improve mobility, HoB elevated and pt able to move to EoB with min guard for safety, increased effort to navigate air bed    Transfers Overall transfer level: Needs assistance Equipment used: Rolling walker (2 wheeled) Transfers: Sit to/from Stand Sit to Stand: +2 physical assistance;Mod assist;From elevated surface;Min guard         General transfer comment: modAx2 to power up from bed, and min  guard to power up from recliner with use of armrests  Ambulation/Gait Ambulation/Gait assistance: +2 safety/equipment;Min guard Gait Distance (Feet): 40 Feet (x2) Assistive device: Rolling walker (2 wheeled) Gait Pattern/deviations: Decreased step length - right;Decreased step length - left;Wide base of support;Antalgic;Decreased weight shift to right;Step-through pattern Gait velocity: slowed Gait velocity interpretation: <1.31 ft/sec, indicative of household ambulator General Gait Details: slowed, steady waddling gait,      Balance Overall balance assessment: Needs assistance Sitting-balance support: Feet supported;No upper extremity supported Sitting balance-Leahy Scale: Fair     Standing balance support: Bilateral upper extremity supported;During functional activity Standing balance-Leahy Scale: Poor Standing balance comment: requires B UE assist to maintain standing                            Cognition Arousal/Alertness: Awake/alert Behavior During Therapy: WFL for tasks assessed/performed Overall Cognitive Status: Impaired/Different from baseline Area of Impairment: Safety/judgement;Problem solving                         Safety/Judgement: Decreased awareness of safety;Decreased awareness of deficits   Problem Solving: Requires verbal cues;Requires tactile cues General Comments: agreeable to sit in chair      Exercises      General Comments General comments (skin integrity, edema, etc.): VSS on RA      Pertinent Vitals/Pain Pain Assessment: Faces Faces Pain Scale: Hurts a little bit Pain Location: back Pain Descriptors / Indicators: Aching;Sore  Pain Intervention(s): Limited activity within patient's tolerance;Monitored during session;Repositioned     PT Goals (current goals can now be found in the care plan section) Acute Rehab PT Goals Patient Stated Goal: be able to get around on her own PT Goal Formulation: With patient Time For  Goal Achievement: 11/16/20 Potential to Achieve Goals: Fair Progress towards PT goals: Progressing toward goals    Frequency    Min 2X/week      PT Plan Current plan remains appropriate       AM-PAC PT "6 Clicks" Mobility   Outcome Measure  Help needed turning from your back to your side while in a flat bed without using bedrails?: None Help needed moving from lying on your back to sitting on the side of a flat bed without using bedrails?: A Lot Help needed moving to and from a bed to a chair (including a wheelchair)?: Total Help needed standing up from a chair using your arms (e.g., wheelchair or bedside chair)?: Total Help needed to walk in hospital room?: Total Help needed climbing 3-5 steps with a railing? : Total 6 Click Score: 10    End of Session   Activity Tolerance: Patient tolerated treatment well Patient left: with call bell/phone within reach;in chair;with chair alarm set Nurse Communication: Mobility status PT Visit Diagnosis: Unsteadiness on feet (R26.81);Other abnormalities of gait and mobility (R26.89);History of falling (Z91.81);Muscle weakness (generalized) (M62.81);Difficulty in walking, not elsewhere classified (R26.2);Pain Pain - Right/Left: Right Pain - part of body: Knee (back)     Time: [id number] PT Time Calculation (min) (ACUTE ONLY): 20 min  Charges:  $Gait Training: 8-22 mins                     Heather [name] PT, DPT Acute Rehabilitation Services Pager [phone] Office [phone]    [place] 11/15/2020, 10:38 AM

## 2020-11-15 NOTE — TOC Progression Note (Addendum)
Transition of Care [facility]) - Progression Note    Patient Details  Name: Heather [name] MRN: [id number] Date of Birth: 1976/01/13  Transition of Care [facility]) CM/SW [place], [place] Phone Number: 11/15/2020, 10:35 AM  Clinical Narrative:     Called and left message with Lavella Lemons at [facility] requesting return call.   1105: Receive return call; CSW provided some clinical details verbally. Resent in hub; Lavella Lemons will review.   1617: [place] declined bed request  Expected Discharge Plan: Skilled Nursing Facility Barriers to Discharge: [place] Forensic scientist), Homeless with medical needs (Homeless)  Expected Discharge Plan and Services Expected Discharge Plan: [place] In-house Referral: Clinical Social Work   Post Acute Care Choice: [place] (Needs LTC bed) Living arrangements for the past 2 months: Homeless Expected Discharge Date: 11/02/20                                     Social Determinants of Health (SDOH) Interventions    Readmission Risk Interventions No flowsheet data found.

## 2020-11-16 DIAGNOSIS — F411 Generalized anxiety disorder: Secondary | ICD-10-CM

## 2020-11-16 DIAGNOSIS — N390 Urinary tract infection, site not specified: Secondary | ICD-10-CM | POA: Diagnosis not present

## 2020-11-16 NOTE — Plan of Care (Signed)
  Problem: Health Behavior/Discharge Planning: Goal: Ability to manage health-related needs will improve Outcome: Progressing   

## 2020-11-16 NOTE — Plan of Care (Signed)
  Problem: Activity: Goal: Risk for activity intolerance will decrease Outcome: Progressing   

## 2020-11-16 NOTE — TOC Progression Note (Signed)
Transition of Care [facility]) - Progression Note    Patient Details  Name: Sharry Gervasio Herrig MRN: [id number] Date of Birth: [birth year]  Transition of Care [facility]) CM/SW [place], [place] Phone Number: 11/16/2020, 1:36 PM  Clinical Narrative:     CSW has continued to seek SNF placement. Barriers include [employer], bariatric needs, and young age.   CSW updated pt.   Expected Discharge Plan: Skilled Nursing Facility Barriers to Discharge: [place] [facility] Gums), Homeless with medical needs (Homeless)  Expected Discharge Plan and Services Expected Discharge Plan: [place] In-house Referral: Clinical Social Work   Post Acute Care Choice: [place] (Needs LTC bed) Living arrangements for the past 2 months: Homeless Expected Discharge Date: 11/02/20                                     Social Determinants of Health (SDOH) Interventions    Readmission Risk Interventions No flowsheet data found.

## 2020-11-16 NOTE — Progress Notes (Signed)
Patient ID: Heather [name], female   DOB: 06/13/1975, 45 y.o.   MRN: [id number]  PROGRESS NOTE    Shruti Neeb [place]  [id number] DOB: [birth year] DOA: 10/30/2020 PCP: Lucianne Lei, MD   Brief Narrative:  45 year old female with morbid obesity, obstructive sleep apnea, COPD, MDD, GAD, insomnia and RLS, diabetes mellitus type 2,renal mass,  and numerous small wounds under her pannus, upper thighs and between her legs presented with right flank and suprapubic pain along with nausea and vomiting.  She was admitted and treated for complicated UTI with antibiotics and has completed treatment.  Currently medically stable for discharge to SNF.  Assessment & Plan:   Complicated UTI -Received 3 days of cefepime.  Later felt to be colonization.  Currently off antibiotics  Nephrolithiasis -Outpatient follow-up with urology  Suicidal ideation/anxiety and depression -Psychiatry has cleared the patient for discharge.  Continue Wellbutrin, trazodone and gabapentin.  Continue as needed clonazepam.  Outpatient follow-up with psychiatry  Chronically disease stage III B--creatinine stable.  Monitor intermittently.  Generalized deconditioning Homelessness -TOC following.  Will need SNF placement.  Panniculitis -Significantly improved  Asthma -Stable  Morbid obesity -Outpatient follow-up    DVT prophylaxis: Lovenox Code Status: Full Family Communication: None Disposition Plan: Status is: Inpatient  Remains inpatient appropriate because:Unsafe d/c plan  Dispo: The patient is from: Homeless              Anticipated d/c is to: SNF              Patient currently is medically stable to d/c.   Difficult to place patient Yes  Consultants: Psychiatry  Procedures: None  Antimicrobials:  Anti-infectives (From admission, onward)    Start     Dose/Rate Route Frequency Ordered Stop   11/01/20 1000  ceFEPIme (MAXIPIME) 2 g in sodium chloride 0.9 % 100 mL IVPB        2 g 200 mL/hr over  30 Minutes Intravenous Every 12 hours 10/31/20 1550 11/03/20 2309   11/01/20 0600  ceFEPIme (MAXIPIME) 2 g in sodium chloride 0.9 % 100 mL IVPB  Status:  Discontinued        2 g 200 mL/hr over 30 Minutes Intravenous Every 8 hours 10/31/20 1541 10/31/20 1550   10/31/20 1000  ertapenem (INVANZ) 1,000 mg in sodium chloride 0.9 % 100 mL IVPB  Status:  Discontinued        1 g 200 mL/hr over 30 Minutes Intravenous Every 24 hours 10/31/20 0948 10/31/20 1541        Subjective: Patient seen and examined at bedside.  No overnight fever, nausea, vomiting reported. Objective: Vitals:   11/15/20 0932 11/15/20 1737 11/15/20 2106 11/16/20 0424  BP: (!) 121/55 105/73 (!) 102/59 (!) 97/59  Pulse: 81 96 95 71  Resp: '18 17 17 18  '$ Temp: 98 F (36.7 C) 97.9 F (36.6 C) 98 F (36.7 C) 98.1 F (36.7 C)  TempSrc:   Oral Oral  SpO2: 97% 96% 96% 92%  Weight:      Height:        Intake/Output Summary (Last 24 hours) at 11/16/2020 0815 Last data filed at 11/16/2020 0658 Gross per 24 hour  Intake 1140 ml  Output 0 ml  Net 1140 ml    Filed Weights   11/01/20 0032 11/01/20 0623 11/06/20 2058  Weight: (!) 191.9 kg (!) 193.2 kg (!) 193.2 kg    Examination:  General exam: No distress.  On room air currently.  Looks chronically ill and deconditioned Respiratory  system: Decreased breath sounds at bases bilaterally with some crackles  cardiovascular system: Rate controlled, S1-S2 heard Gastrointestinal system: Abdomen is morbidly obese, distended slightly, soft and nontender.  Bowel sounds are heard. Extremities: Mild lower extremity edema present; no cyanosis  Data Reviewed: I have personally reviewed following labs and imaging studies  CBC: Recent Labs  Lab 11/11/20 0417  WBC 8.9  HGB 9.8*  HCT 32.8*  MCV 86.3  PLT [id number]    Basic Metabolic Panel: Recent Labs  Lab 11/11/20 0417 11/14/20 0435  NA 137  --   K 3.7  --   CL 102  --   CO2 29  --   GLUCOSE 84  --   BUN 16  --    CREATININE 1.22* 1.29*  CALCIUM 9.0  --     GFR: Estimated Creatinine Clearance: 97.5 mL/min (A) (by C-G formula based on SCr of 1.29 mg/dL (H)). Liver Function Tests: No results for input(s): AST, ALT, ALKPHOS, BILITOT, PROT, ALBUMIN in the last 168 hours. No results for input(s): LIPASE, AMYLASE in the last 168 hours. No results for input(s): AMMONIA in the last 168 hours. Coagulation Profile: No results for input(s): INR, PROTIME in the last 168 hours. Cardiac Enzymes: No results for input(s): CKTOTAL, CKMB, CKMBINDEX, TROPONINI in the last 168 hours. BNP (last 3 results) No results for input(s): PROBNP in the last 8760 hours. HbA1C: No results for input(s): HGBA1C in the last 72 hours. CBG: No results for input(s): GLUCAP in the last 168 hours. Lipid Profile: No results for input(s): CHOL, HDL, LDLCALC, TRIG, CHOLHDL, LDLDIRECT in the last 72 hours. Thyroid Function Tests: No results for input(s): TSH, T4TOTAL, FREET4, T3FREE, THYROIDAB in the last 72 hours. Anemia Panel: No results for input(s): VITAMINB12, FOLATE, FERRITIN, TIBC, IRON, RETICCTPCT in the last 72 hours. Sepsis Labs: No results for input(s): PROCALCITON, LATICACIDVEN in the last 168 hours.  No results found for this or any previous visit (from the past 240 hour(s)).       Radiology Studies: No results found.      Scheduled Meds:  ascorbic acid  500 mg Oral Daily   buPROPion ER  200 mg Oral BID   calcium carbonate  400 mg of elemental calcium Oral BID WC   clotrimazole   Topical BID   enoxaparin (LOVENOX) injection  100 mg Subcutaneous Q24H   fluticasone  2 spray Each Nare BID   gabapentin  200 mg Oral TID   montelukast  10 mg Oral QHS   nystatin   Topical BID   pantoprazole  40 mg Oral Daily   rOPINIRole  4 mg Oral QHS   senna-docusate  2 tablet Oral Daily   sodium chloride flush  3 mL Intravenous Q12H   traZODone  50 mg Oral QHS   Continuous Infusions:  sodium chloride             Aline August, MD [facility] 11/16/2020, 8:15 AM

## 2020-11-17 DIAGNOSIS — N183 Chronic kidney disease, stage 3 unspecified: Secondary | ICD-10-CM | POA: Diagnosis not present

## 2020-11-17 DIAGNOSIS — N390 Urinary tract infection, site not specified: Secondary | ICD-10-CM | POA: Diagnosis not present

## 2020-11-17 MED ORDER — FUROSEMIDE 10 MG/ML IJ SOLN
40.0000 mg | Freq: Every day | INTRAMUSCULAR | Status: DC
  Administered 2020-11-17 – 2020-11-20 (×4): 40 mg via INTRAVENOUS
  Filled 2020-11-17 (×4): qty 4

## 2020-11-17 NOTE — Progress Notes (Signed)
Patient ID: Heather [name], female   DOB: 1975-06-16, 45 y.o.   MRN: [id number]  PROGRESS NOTE    Heather [name]  [id number] DOB: [birth year] DOA: 10/30/2020 PCP: Lucianne Lei, MD   Brief Narrative:  45 year old female with morbid obesity, obstructive sleep apnea, COPD, MDD, GAD, insomnia and RLS, diabetes mellitus type 2,renal mass,  and numerous small wounds under her pannus, upper thighs and between her legs presented with right flank and suprapubic pain along with nausea and vomiting.  She was admitted and treated for complicated UTI with antibiotics and has completed treatment.  Currently medically stable for discharge to SNF.  Assessment & Plan:   Complicated UTI -Received 3 days of cefepime.  Later felt to be colonization.  Currently off antibiotics  Nephrolithiasis -Outpatient follow-up with urology  Suicidal ideation/anxiety and depression -Psychiatry has cleared the patient for discharge.  Continue Wellbutrin, trazodone and gabapentin.  Continue as needed clonazepam.  Outpatient follow-up with psychiatry  Chronically disease stage III B--creatinine stable.  Monitor intermittently. -Patient states that she is not having that much urine output and retaining fluid and gaining weight.  She states that she used to be on indapamide in the past.  She is willing to try Lasix.  We will start her on Lasix 40 mg IV daily  Generalized deconditioning Homelessness -TOC following.  Will need SNF placement.  Panniculitis -Significantly improved  Asthma -Stable  Morbid obesity -Outpatient follow-up    DVT prophylaxis: Lovenox Code Status: Full Family Communication: None Disposition Plan: Status is: Inpatient  Remains inpatient appropriate because:Unsafe d/c plan  Dispo: The patient is from: Homeless              Anticipated d/c is to: SNF              Patient currently is medically stable to d/c.   Difficult to place patient Yes  Consultants:  Psychiatry  Procedures: None  Antimicrobials:  Anti-infectives (From admission, onward)    Start     Dose/Rate Route Frequency Ordered Stop   11/01/20 1000  ceFEPIme (MAXIPIME) 2 g in sodium chloride 0.9 % 100 mL IVPB        2 g 200 mL/hr over 30 Minutes Intravenous Every 12 hours 10/31/20 1550 11/03/20 2309   11/01/20 0600  ceFEPIme (MAXIPIME) 2 g in sodium chloride 0.9 % 100 mL IVPB  Status:  Discontinued        2 g 200 mL/hr over 30 Minutes Intravenous Every 8 hours 10/31/20 1541 10/31/20 1550   10/31/20 1000  ertapenem (INVANZ) 1,000 mg in sodium chloride 0.9 % 100 mL IVPB  Status:  Discontinued        1 g 200 mL/hr over 30 Minutes Intravenous Every 24 hours 10/31/20 0948 10/31/20 1541        Subjective: Patient seen and examined at bedside.  No worsening abdominal pain, fever or vomiting reported.  Feels more bloated and complains of decreased urine output Objective: Vitals:   11/16/20 0901 11/16/20 2053 11/17/20 0548 11/17/20 1004  BP: 128/65 117/77 (!) 110/56 132/74  Pulse: 78 90 72 85  Resp: '18 18 20 16  '$ Temp: 98.3 F (36.8 C) 98.9 F (37.2 C) 98 F (36.7 C) 97.9 F (36.6 C)  TempSrc:  Oral Oral Oral  SpO2: 96% 92% 98% 95%  Weight:      Height:        Intake/Output Summary (Last 24 hours) at 11/17/2020 1017 Last data filed at 11/17/2020 0548 Gross per 24  hour  Intake 543 ml  Output 1200 ml  Net -657 ml    Filed Weights   11/01/20 0032 11/01/20 0623 11/06/20 2058  Weight: (!) 191.9 kg (!) 193.2 kg (!) 193.2 kg    Examination:  General exam: Currently on room air.  No acute distress.  Looks chronically ill and deconditioned Respiratory system: Bilateral decreased breath sounds at bases with scattered crackles  cardiovascular system: S1-S2 heard; rate controlled gastrointestinal system: Abdomen is morbidly obese, mildly distended, soft and nontender.  Normal bowel sounds heard extremities: Bilateral lower extremity mild edema present; no clubbing Data  Reviewed: I have personally reviewed following labs and imaging studies  CBC: Recent Labs  Lab 11/11/20 0417  WBC 8.9  HGB 9.8*  HCT 32.8*  MCV 86.3  PLT [id number]    Basic Metabolic Panel: Recent Labs  Lab 11/11/20 0417 11/14/20 0435  NA 137  --   K 3.7  --   CL 102  --   CO2 29  --   GLUCOSE 84  --   BUN 16  --   CREATININE 1.22* 1.29*  CALCIUM 9.0  --     GFR: Estimated Creatinine Clearance: 97.5 mL/min (A) (by C-G formula based on SCr of 1.29 mg/dL (H)). Liver Function Tests: No results for input(s): AST, ALT, ALKPHOS, BILITOT, PROT, ALBUMIN in the last 168 hours. No results for input(s): LIPASE, AMYLASE in the last 168 hours. No results for input(s): AMMONIA in the last 168 hours. Coagulation Profile: No results for input(s): INR, PROTIME in the last 168 hours. Cardiac Enzymes: No results for input(s): CKTOTAL, CKMB, CKMBINDEX, TROPONINI in the last 168 hours. BNP (last 3 results) No results for input(s): PROBNP in the last 8760 hours. HbA1C: No results for input(s): HGBA1C in the last 72 hours. CBG: No results for input(s): GLUCAP in the last 168 hours. Lipid Profile: No results for input(s): CHOL, HDL, LDLCALC, TRIG, CHOLHDL, LDLDIRECT in the last 72 hours. Thyroid Function Tests: No results for input(s): TSH, T4TOTAL, FREET4, T3FREE, THYROIDAB in the last 72 hours. Anemia Panel: No results for input(s): VITAMINB12, FOLATE, FERRITIN, TIBC, IRON, RETICCTPCT in the last 72 hours. Sepsis Labs: No results for input(s): PROCALCITON, LATICACIDVEN in the last 168 hours.  No results found for this or any previous visit (from the past 240 hour(s)).       Radiology Studies: No results found.      Scheduled Meds:  ascorbic acid  500 mg Oral Daily   buPROPion ER  200 mg Oral BID   calcium carbonate  400 mg of elemental calcium Oral BID WC   clotrimazole   Topical BID   enoxaparin (LOVENOX) injection  100 mg Subcutaneous Q24H   fluticasone  2 spray Each  Nare BID   gabapentin  200 mg Oral TID   montelukast  10 mg Oral QHS   nystatin   Topical BID   pantoprazole  40 mg Oral Daily   rOPINIRole  4 mg Oral QHS   senna-docusate  2 tablet Oral Daily   sodium chloride flush  3 mL Intravenous Q12H   traZODone  50 mg Oral QHS   Continuous Infusions:  sodium chloride            Aline August, MD [facility] 11/17/2020, 10:17 AM

## 2020-11-17 NOTE — Plan of Care (Signed)
  Problem: Activity: Goal: Risk for activity intolerance will decrease Outcome: Progressing   Problem: Skin Integrity: Goal: Risk for impaired skin integrity will decrease Outcome: Progressing   Problem: Health Behavior/Discharge (Transition) Planning: Goal: Ability to manage health-related needs will improve Outcome: Progressing

## 2020-11-17 NOTE — TOC Progression Note (Addendum)
Transition of Care [facility]) - Progression Note    Patient Details  Name: Heather [name] MRN: [id number] Date of Birth: 02/06/1976  Transition of Care [facility]) CM/SW [place], [place] Phone Number: 11/17/2020, 1:19 PM  Clinical Narrative:     CSW Mining engineer at [facility] [phone]). They confirmed again that they don't have any bariatric beds available and are unsure when they would.   CSW emailed financial counselor Toniann Ket to follow up about pt getting assistance with getting her Medicaid changed to LTC Medicaid.   [facility] in [place] may be able to consider pt. CSW spoke with pt who is agreeable.   Pt requested CSW speak with foster parent Teressa at [phone]. CSW called. They are wanting to get guardianship of pt's children to prevent them from returning to foster care. CSW explained that they would need to provide their own notary's to complete paperwork in hospital. CSW printed old copy of pt's ID card and left with pt in room.   1413: [place] liaison explained pt is actually over their weight limit and also cannot accept with pt having had suicide ideation documented.    Expected Discharge Plan: Skilled Nursing Facility Barriers to Discharge: [name] [facility] Gums), Homeless with medical needs (Homeless)  Expected Discharge Plan and Services Expected Discharge Plan: [place] In-house Referral: Clinical Social Work   Post Acute Care Choice: [place] (Needs LTC bed) Living arrangements for the past 2 months: Homeless Expected Discharge Date: 11/02/20                                     Social Determinants of Health (SDOH) Interventions    Readmission Risk Interventions No flowsheet data found.

## 2020-11-17 NOTE — Progress Notes (Addendum)
Occupational Therapy Treatment Patient Details Name: Heather [name] MRN: [id number] DOB: 1975-09-11 Today's Date: 11/17/2020    History of present illness 45 y.o. female presented to ED for chest pain and palpitations and feeling like her throat was closing while at her behavioral health facility for SI. Admitted 10/31/20 for treatment of complicated UTI PMH: anxiety and depression (hx of bipolar and schizoaffective disorder in chart in past), ACD, panniculitis, debility,GERD,  OSA not on cpap, chronic back pain.   OT comments  Pt. Seen for skilled OT treatment.  States "I dont have to go to that chair do I?" Declining eob/oob this session but requests bed level grooming tasks of washing and combing hair.  Encouragement for pt. To engage and complete as much of these tasks as able.  Provided reacher and sock aide for LB ADLs.  Pt. Declined return demo eob this day.  Next session focus on use of A/E for LB dressing.    Follow Up Recommendations  SNF;Supervision/Assistance - 24 hour    Equipment Recommendations       Recommendations for Other Services Other (comment)    Precautions / Restrictions Precautions Precautions: Fall Precaution Comments: fall prior to hospitalization       Mobility Bed Mobility                    Transfers                      Balance                                           ADL either performed or assessed with clinical judgement   ADL Overall ADL's : Needs assistance/impaired     Grooming: Moderate assistance Grooming Details (indicate cue type and reason): washed hair and combed hair bed level-declined eob/oob, encouragement to initiate and complete wanting me to complete for her, reviewed she needs to do as much as she can before assistance is provided by therapist asst.             Lower Body Dressing: With adaptive equipment Lower Body Dressing Details (indicate cue type and reason): provided  reacher and sock aide (pt. reports she does not know where hers are). declined eob for return demo of reacher but wanted to use the reacher to gather items in bed.               General ADL Comments: pt. declined eob/oob.  provided reacher and sock aide as pt. reports she does not have hers anymore.  declined return demo of use for LB dressing but requrested to have the reacher in bed for use to gather things.  reviewed focus of next session would be LB ADLs.  pt. required encouragement to engage in tasks, reviewed focus of therapy is for her to get stronger and work towards independence with adls.     Vision       Perception     Praxis      Cognition Arousal/Alertness: Awake/alert Behavior During Therapy: [facility] for tasks assessed/performed                                            Exercises     Shoulder Instructions  General Comments  Reports having 48 year old twins that live with a family friend.  Speaking of her mother throughout session, expressing concerns for her mother's living situation as is is not settled at this time.      Pertinent Vitals/ Pain       Pain Assessment: No/denies pain  Home Living                                          Prior Functioning/Environment              Frequency  Min 2X/week        Progress Toward Goals  OT Goals(current goals can now be found in the care plan section)  Progress towards OT goals: Progressing toward goals     Plan Discharge plan remains appropriate    Co-evaluation                 AM-PAC OT "6 Clicks" Daily Activity     Outcome Measure   Help from another person eating meals?: None Help from another person taking care of personal grooming?: A Little Help from another person toileting, which includes using toliet, bedpan, or urinal?: A Lot Help from another person bathing (including washing, rinsing, drying)?: A Lot Help from another person to put on  and taking off regular upper body clothing?: A Little Help from another person to put on and taking off regular lower body clothing?: Total 6 Click Score: 15    End of Session Equipment Utilized During Treatment: Other (comment) (provided reacher and sock aide)  OT Visit Diagnosis: Unsteadiness on feet (R26.81);Other abnormalities of gait and mobility (R26.89);Muscle weakness (generalized) (M62.81);Pain;Other (comment) Pain - Right/Left: Right Pain - part of body: Knee   Activity Tolerance Patient tolerated treatment well   Patient Left in bed;with call bell/phone within reach   Nurse Communication          Time: [id number] OT Time Calculation (min): 22 min  Charges: OT General Charges $OT Visit: 1 Visit OT Treatments $Self Care/Home Management : 8-22 mins  Sonia Baller, COTA/L Acute Rehabilitation [phone]    Tanya Nones 11/17/2020, 8:38 AM

## 2020-11-17 NOTE — Plan of Care (Signed)
Pt alert and oriented x4. Med compliant. Received Klonopin and tylenol prn x 1 for anxiety and general pain in her lower back. Pt refused purewick and device removed. Pt stated she knew when she had to void. No complaints at this time. Pt and vitals stable. Problem: Health Behavior/Discharge Planning: Goal: Ability to manage health-related needs will improve Outcome: Progressing   Problem: Activity: Goal: Risk for activity intolerance will decrease Outcome: Progressing   Problem: Skin Integrity: Goal: Risk for impaired skin integrity will decrease Outcome: Progressing   Problem: Education: Goal: Knowledge of warning signs, risks, and behaviors that relate to suicide ideation and self-harm behaviors will improve Outcome: Progressing   Problem: Health Behavior/Discharge (Transition) Planning: Goal: Ability to manage health-related needs will improve Outcome: Progressing   Problem: Clinical Measurements: Goal: Remain free from any harm during hospitalization Outcome: Progressing   Problem: Medication Management: Goal: Adhere to prescribed medication regimen Outcome: Progressing

## 2020-11-17 NOTE — Progress Notes (Signed)
Physical Therapy Treatment Patient Details Name: Heather [name] MRN: [id number] DOB: 01-19-1976 Today's Date: 11/17/2020    History of Present Illness 45 y.o. female presented to ED for chest pain and palpitations and feeling like her throat was closing while at her behavioral health facility for SI. Admitted 10/31/20 for treatment of complicated UTI PMH: anxiety and depression (hx of bipolar and schizoaffective disorder in chart in past), ACD, panniculitis, debility,GERD,  OSA not on cpap, chronic back pain.    PT Comments    Pt supine in bed agreeable to therapy but request to return to bed after ambulation due to discomfort sitting up in recliner. Pt is making good progress towards his goals. Pt is currently min guard for bed mobility, transfers and ambulation using RW. Pt also able to progress ambulation distance by 40 feet. Pt looking forward to discharge to SNF hopefully this weekend. PT will continue to follow acutely until discharge.     Follow Up Recommendations  SNF     Equipment Recommendations  None recommended by PT (has RW, left BSC at hotel)    Recommendations for Other Services OT consult     Precautions / Restrictions Precautions Precautions: Fall Precaution Comments: fall prior to hospitalization Restrictions Weight Bearing Restrictions: No    Mobility  Bed Mobility Overal bed mobility: Needs Assistance Bed Mobility: Supine to Sit     Supine to sit: HOB elevated;Min guard Sit to supine: Supervision   General bed mobility comments: bed deflated to improve mobility, HoB elevated and pt able to move to EoB with min guard for safety, increased effort to navigate air bed, increased effort to return to bed and with bed in Trendelenberg scoot her self to HoB    Transfers Overall transfer level: Needs assistance Equipment used: Rolling walker (2 wheeled) Transfers: Sit to/from Stand Sit to Stand: +2 physical assistance;From elevated surface;Min guard          General transfer comment: min guard to power up to RW with bed deflated  Ambulation/Gait Ambulation/Gait assistance: +2 safety/equipment;Min guard Gait Distance (Feet): 40 Feet (x3) Assistive device: Rolling walker (2 wheeled) Gait Pattern/deviations: Decreased step length - right;Decreased step length - left;Wide base of support;Antalgic;Decreased weight shift to right;Step-through pattern Gait velocity: slowed Gait velocity interpretation: <1.8 ft/sec, indicate of risk for recurrent falls General Gait Details: pt able to progress ambulation distance with min guard today and 2 seated rest breaks          Balance Overall balance assessment: Needs assistance Sitting-balance support: Feet supported;No upper extremity supported Sitting balance-Leahy Scale: Fair     Standing balance support: Bilateral upper extremity supported;During functional activity Standing balance-Leahy Scale: Poor Standing balance comment: requires B UE assist to maintain standing                            Cognition Arousal/Alertness: Awake/alert Behavior During Therapy: WFL for tasks assessed/performed Overall Cognitive Status: Impaired/Different from baseline Area of Impairment: Safety/judgement;Problem solving                         Safety/Judgement: Decreased awareness of safety;Decreased awareness of deficits   Problem Solving: Requires verbal cues;Requires tactile cues           General Comments General comments (skin integrity, edema, etc.): Pt with c/o lightheadedness after first bout of      Pertinent Vitals/Pain Pain Assessment: No/denies pain     PT Goals (current  goals can now be found in the care plan section) Acute Rehab PT Goals Patient Stated Goal: be able to get around on her own PT Goal Formulation: With patient Time For Goal Achievement: 11/16/20 Potential to Achieve Goals: Fair Progress towards PT goals: Progressing toward goals     Frequency    Min 2X/week      PT Plan Current plan remains appropriate       AM-PAC PT "6 Clicks" Mobility   Outcome Measure  Help needed turning from your back to your side while in a flat bed without using bedrails?: None Help needed moving from lying on your back to sitting on the side of a flat bed without using bedrails?: A Lot Help needed moving to and from a bed to a chair (including a wheelchair)?: Total Help needed standing up from a chair using your arms (e.g., wheelchair or bedside chair)?: Total Help needed to walk in hospital room?: Total Help needed climbing 3-5 steps with a railing? : Total 6 Click Score: 10    End of Session   Activity Tolerance: Patient tolerated treatment well Patient left: with call bell/phone within reach;in chair;with chair alarm set Nurse Communication: Mobility status PT Visit Diagnosis: Unsteadiness on feet (R26.81);Other abnormalities of gait and mobility (R26.89);History of falling (Z91.81);Muscle weakness (generalized) (M62.81);Difficulty in walking, not elsewhere classified (R26.2);Pain Pain - Right/Left: Right Pain - part of body: Knee (back)     Time: 1335-1401 PT Time Calculation (min) (ACUTE ONLY): 26 min  Charges:  $Gait Training: 8-22 mins $Therapeutic Activity: 8-22 mins                     Cherice Glennie B. Migdalia Dk PT, DPT Acute Rehabilitation Services Pager [phone] Office [phone]    [employer] 11/17/2020, 3:21 PM

## 2020-11-18 DIAGNOSIS — F331 Major depressive disorder, recurrent, moderate: Secondary | ICD-10-CM

## 2020-11-18 DIAGNOSIS — N390 Urinary tract infection, site not specified: Secondary | ICD-10-CM | POA: Diagnosis not present

## 2020-11-18 DIAGNOSIS — N183 Chronic kidney disease, stage 3 unspecified: Secondary | ICD-10-CM | POA: Diagnosis not present

## 2020-11-18 LAB — BASIC METABOLIC PANEL
Anion gap: 9 (ref 5–15)
BUN: 20 mg/dL (ref 6–20)
CO2: 27 mmol/L (ref 22–32)
Calcium: 9.3 mg/dL (ref 8.9–10.3)
Chloride: 102 mmol/L (ref 98–111)
Creatinine, Ser: 1.4 mg/dL — ABNORMAL HIGH (ref 0.44–1.00)
GFR, Estimated: 48 mL/min — ABNORMAL LOW (ref >60)
Glucose, Bld: 88 mg/dL (ref 70–99)
Potassium: 3.9 mmol/L (ref 3.5–5.1)
Sodium: 138 mmol/L (ref 135–145)

## 2020-11-18 LAB — CBC WITH DIFFERENTIAL/PLATELET
Abs Immature Granulocytes: 0.19 10*3/uL — ABNORMAL HIGH (ref 0.00–0.07)
Basophils Absolute: 0.1 10*3/uL (ref 0.0–0.1)
Basophils Relative: 1 %
Eosinophils Absolute: 0.6 10*3/uL — ABNORMAL HIGH (ref 0.0–0.5)
Eosinophils Relative: 6 %
HCT: 35.7 % — ABNORMAL LOW (ref 36.0–46.0)
Hemoglobin: 10.6 g/dL — ABNORMAL LOW (ref 12.0–15.0)
Immature Granulocytes: 2 %
Lymphocytes Relative: 20 %
Lymphs Abs: 2.1 10*3/uL (ref 0.7–4.0)
MCH: 25.9 pg — ABNORMAL LOW (ref 26.0–34.0)
MCHC: 29.7 g/dL — ABNORMAL LOW (ref 30.0–36.0)
MCV: 87.1 fL (ref 80.0–100.0)
Monocytes Absolute: 0.7 10*3/uL (ref 0.1–1.0)
Monocytes Relative: 7 %
Neutro Abs: 6.9 10*3/uL (ref 1.7–7.7)
Neutrophils Relative %: 64 %
Platelets: 482 10*3/uL — ABNORMAL HIGH (ref 150–400)
RBC: 4.1 MIL/uL (ref 3.87–5.11)
RDW: 17.8 % — ABNORMAL HIGH (ref 11.5–15.5)
WBC: 10.6 10*3/uL — ABNORMAL HIGH (ref 4.0–10.5)
nRBC: 0 % (ref 0.0–0.2)

## 2020-11-18 NOTE — Plan of Care (Signed)
  Problem: Health Behavior/Discharge Planning: Goal: Ability to manage health-related needs will improve Outcome: Progressing   Problem: Activity: Goal: Risk for activity intolerance will decrease Outcome: Progressing   Problem: Skin Integrity: Goal: Risk for impaired skin integrity will decrease Outcome: Progressing   

## 2020-11-18 NOTE — Progress Notes (Signed)
Patient ID: Heather [name], female   DOB: [birth year], 45 y.o.   MRN: [id number]  PROGRESS NOTE    Ashe Bengston Erhard  [id number] DOB: 1976/02/19 DOA: 10/30/2020 PCP: Lucianne Lei, MD   Brief Narrative:  45 year old female with morbid obesity, obstructive sleep apnea, COPD, MDD, GAD, insomnia and RLS, diabetes mellitus type 2,renal mass,  and numerous small wounds under her pannus, upper thighs and between her legs presented with right flank and suprapubic pain along with nausea and vomiting.  She was admitted and treated for complicated UTI with antibiotics and has completed treatment.  Currently medically stable for discharge to SNF.  Assessment & Plan:   Complicated UTI -Received 3 days of cefepime.  Later felt to be colonization.  Currently off antibiotics  Nephrolithiasis -Outpatient follow-up with urology  Suicidal ideation/anxiety and depression -Psychiatry has cleared the patient for discharge.  Continue Wellbutrin, trazodone and gabapentin.  Continue as needed clonazepam.  Outpatient follow-up with psychiatry  Chronically disease stage III B--creatinine stable.  Monitor intermittently. -Patient states that she is not having that much urine output and retaining fluid and gaining weight.  She states that she used to be on indapamide in the past.  Started on Lasix IV 40 mg daily on 11/17/2020.  Strict input and output.  Daily weights.  Monitor creatinine.  Generalized deconditioning Homelessness -TOC following.  Will need SNF placement.  Panniculitis -Significantly improved  Asthma -Stable  Morbid obesity -Outpatient follow-up    DVT prophylaxis: Lovenox Code Status: Full Family Communication: None Disposition Plan: Status is: Inpatient  Remains inpatient appropriate because:Unsafe d/c plan  Dispo: The patient is from: Homeless              Anticipated d/c is to: SNF              Patient currently is medically stable to d/c.   Difficult to place patient  Yes  Consultants: Psychiatry  Procedures: None  Antimicrobials:  Anti-infectives (From admission, onward)    Start     Dose/Rate Route Frequency Ordered Stop   11/01/20 1000  ceFEPIme (MAXIPIME) 2 g in sodium chloride 0.9 % 100 mL IVPB        2 g 200 mL/hr over 30 Minutes Intravenous Every 12 hours 10/31/20 1550 11/03/20 2309   11/01/20 0600  ceFEPIme (MAXIPIME) 2 g in sodium chloride 0.9 % 100 mL IVPB  Status:  Discontinued        2 g 200 mL/hr over 30 Minutes Intravenous Every 8 hours 10/31/20 1541 10/31/20 1550   10/31/20 1000  ertapenem (INVANZ) 1,000 mg in sodium chloride 0.9 % 100 mL IVPB  Status:  Discontinued        1 g 200 mL/hr over 30 Minutes Intravenous Every 24 hours 10/31/20 0948 10/31/20 1541        Subjective: Patient seen and examined at bedside.  Denies worsening shortness breath, fever, vomiting or abdominal pain.  Felt slightly dizzy yesterday on walking.  Objective: Vitals:   11/17/20 1400 11/17/20 1751 11/17/20 2059 11/18/20 0502  BP:  117/63 108/61 121/61  Pulse:  98 85 79  Resp:  '17 20 18  '$ Temp:  98.4 F (36.9 C) 98.7 F (37.1 C) 98.4 F (36.9 C)  TempSrc:  Oral Oral Oral  SpO2:  95% 97% 97%  Weight: (!) 196 kg     Height: '5\' 3"'$  (1.6 m)       Intake/Output Summary (Last 24 hours) at 11/18/2020 0803 Last data filed at 11/18/2020 0600  Gross per 24 hour  Intake 1260 ml  Output 700 ml  Net 560 ml    Filed Weights   11/01/20 0623 11/06/20 2058 11/17/20 1400  Weight: (!) 193.2 kg (!) 193.2 kg (!) 196 kg    Examination:  General exam: No distress.  On room air currently.  Looks chronically ill and deconditioned Respiratory system: Decreased breath sounds at bases bilaterally with some crackles  cardiovascular system: Rate controlled, S1-S2 heard gastrointestinal system: Abdomen is morbidly obese, distended slightly, soft and nontender.  Bowel sounds are heard  extremities: No cyanosis; lower extremity edema present bilaterally  Data  Reviewed: I have personally reviewed following labs and imaging studies  CBC: Recent Labs  Lab 11/18/20 0649  WBC 10.6*  NEUTROABS 6.9  HGB 10.6*  HCT 35.7*  MCV 87.1  PLT 482*    Basic Metabolic Panel: Recent Labs  Lab 11/14/20 0435  CREATININE 1.29*    GFR: Estimated Creatinine Clearance: 98.5 mL/min (A) (by C-G formula based on SCr of 1.29 mg/dL (H)). Liver Function Tests: No results for input(s): AST, ALT, ALKPHOS, BILITOT, PROT, ALBUMIN in the last 168 hours. No results for input(s): LIPASE, AMYLASE in the last 168 hours. No results for input(s): AMMONIA in the last 168 hours. Coagulation Profile: No results for input(s): INR, PROTIME in the last 168 hours. Cardiac Enzymes: No results for input(s): CKTOTAL, CKMB, CKMBINDEX, TROPONINI in the last 168 hours. BNP (last 3 results) No results for input(s): PROBNP in the last 8760 hours. HbA1C: No results for input(s): HGBA1C in the last 72 hours. CBG: No results for input(s): GLUCAP in the last 168 hours. Lipid Profile: No results for input(s): CHOL, HDL, LDLCALC, TRIG, CHOLHDL, LDLDIRECT in the last 72 hours. Thyroid Function Tests: No results for input(s): TSH, T4TOTAL, FREET4, T3FREE, THYROIDAB in the last 72 hours. Anemia Panel: No results for input(s): VITAMINB12, FOLATE, FERRITIN, TIBC, IRON, RETICCTPCT in the last 72 hours. Sepsis Labs: No results for input(s): PROCALCITON, LATICACIDVEN in the last 168 hours.  No results found for this or any previous visit (from the past 240 hour(s)).       Radiology Studies: No results found.      Scheduled Meds:  ascorbic acid  500 mg Oral Daily   buPROPion ER  200 mg Oral BID   calcium carbonate  400 mg of elemental calcium Oral BID WC   clotrimazole   Topical BID   enoxaparin (LOVENOX) injection  100 mg Subcutaneous Q24H   fluticasone  2 spray Each Nare BID   furosemide  40 mg Intravenous Daily   gabapentin  200 mg Oral TID   montelukast  10 mg Oral QHS    nystatin   Topical BID   pantoprazole  40 mg Oral Daily   rOPINIRole  4 mg Oral QHS   senna-docusate  2 tablet Oral Daily   sodium chloride flush  3 mL Intravenous Q12H   traZODone  50 mg Oral QHS   Continuous Infusions:  sodium chloride            Aline August, MD [facility] 11/18/2020, 8:03 AM

## 2020-11-19 DIAGNOSIS — F331 Major depressive disorder, recurrent, moderate: Secondary | ICD-10-CM | POA: Diagnosis not present

## 2020-11-19 DIAGNOSIS — G4733 Obstructive sleep apnea (adult) (pediatric): Secondary | ICD-10-CM | POA: Diagnosis not present

## 2020-11-19 DIAGNOSIS — N183 Chronic kidney disease, stage 3 unspecified: Secondary | ICD-10-CM | POA: Diagnosis not present

## 2020-11-19 DIAGNOSIS — N390 Urinary tract infection, site not specified: Secondary | ICD-10-CM | POA: Diagnosis not present

## 2020-11-19 LAB — BASIC METABOLIC PANEL
Anion gap: 12 (ref 5–15)
BUN: 19 mg/dL (ref 6–20)
CO2: 29 mmol/L (ref 22–32)
Calcium: 9.7 mg/dL (ref 8.9–10.3)
Chloride: 98 mmol/L (ref 98–111)
Creatinine, Ser: 1.24 mg/dL — ABNORMAL HIGH (ref 0.44–1.00)
GFR, Estimated: 56 mL/min — ABNORMAL LOW (ref >60)
Glucose, Bld: 88 mg/dL (ref 70–99)
Potassium: 3.6 mmol/L (ref 3.5–5.1)
Sodium: 139 mmol/L (ref 135–145)

## 2020-11-19 NOTE — Progress Notes (Signed)
Patient ID: Heather [name], female   DOB: 1975-11-01, 45 y.o.   MRN: [id number]  PROGRESS NOTE    Heather [name]  [id number] DOB: 1975-10-16 DOA: 10/30/2020 PCP: Lucianne Lei, MD   Brief Narrative:  45 year old female with morbid obesity, obstructive sleep apnea, COPD, MDD, GAD, insomnia and RLS, diabetes mellitus type 2,renal mass,  and numerous small wounds under her pannus, upper thighs and between her legs presented with right flank and suprapubic pain along with nausea and vomiting.  She was admitted and treated for complicated UTI with antibiotics and has completed treatment.  Currently medically stable for discharge to SNF.  Assessment & Plan:   Complicated UTI -Received 3 days of cefepime.  Later felt to be colonization.  Currently off antibiotics  Nephrolithiasis -Outpatient follow-up with urology  Suicidal ideation/anxiety and depression -Psychiatry has cleared the patient for discharge.  Continue Wellbutrin, trazodone and gabapentin.  Continue as needed clonazepam.  Outpatient follow-up with psychiatry  Chronically disease stage III B--creatinine stable.  Monitor intermittently. -Patient states that she is not having that much urine output and retaining fluid and gaining weight.  She states that she used to be on indapamide in the past.  Started on Lasix IV 40 mg daily on 11/17/2020.  Strict input and output.  Daily weights.  Monitor creatinine.  Generalized deconditioning Homelessness -TOC following.  Will need SNF placement.  Panniculitis -Significantly improved  Asthma -Stable  Morbid obesity -Outpatient follow-up    DVT prophylaxis: Lovenox Code Status: Full Family Communication: None Disposition Plan: Status is: Inpatient  Remains inpatient appropriate because:Unsafe d/c plan  Dispo: The patient is from: Homeless              Anticipated d/c is to: SNF              Patient currently is medically stable to d/c.   Difficult to place patient  Yes  Consultants: Psychiatry  Procedures: None  Antimicrobials:  Anti-infectives (From admission, onward)    Start     Dose/Rate Route Frequency Ordered Stop   11/01/20 1000  ceFEPIme (MAXIPIME) 2 g in sodium chloride 0.9 % 100 mL IVPB        2 g 200 mL/hr over 30 Minutes Intravenous Every 12 hours 10/31/20 1550 11/03/20 2309   11/01/20 0600  ceFEPIme (MAXIPIME) 2 g in sodium chloride 0.9 % 100 mL IVPB  Status:  Discontinued        2 g 200 mL/hr over 30 Minutes Intravenous Every 8 hours 10/31/20 1541 10/31/20 1550   10/31/20 1000  ertapenem (INVANZ) 1,000 mg in sodium chloride 0.9 % 100 mL IVPB  Status:  Discontinued        1 g 200 mL/hr over 30 Minutes Intravenous Every 24 hours 10/31/20 0948 10/31/20 1541        Subjective: Patient seen and examined at bedside.  No fever, worsening shortness of breath, vomiting, abdominal pain reported. Objective: Vitals:   11/18/20 0502 11/18/20 1127 11/18/20 1837 11/19/20 0526  BP: 121/61 110/60 117/64 113/65  Pulse: 79 82 90 73  Resp: '18 18 17 18  '$ Temp: 98.4 F (36.9 C) 98.2 F (36.8 C) 98.3 F (36.8 C) 97.8 F (36.6 C)  TempSrc: Oral Oral  Oral  SpO2: 97% 96% 95% 96%  Weight:  (!) 191.4 kg  (!) 191.4 kg  Height:        Intake/Output Summary (Last 24 hours) at 11/19/2020 0820 Last data filed at 11/19/2020 0556 Gross per 24 hour  Intake 240 ml  Output 1100 ml  Net -860 ml    Filed Weights   11/17/20 1400 11/18/20 1127 11/19/20 0526  Weight: (!) 196 kg (!) 191.4 kg (!) 191.4 kg    Examination:  General exam: Currently on room air.  No acute distress.  Looks chronically ill and deconditioned Respiratory system: Bilateral decreased breath sounds at bases with scattered crackles  cardiovascular system: S1-S2 heard; rate controlled gastrointestinal system: Abdomen is morbidly obese, mildly distended, soft and nontender.  Normal bowel sounds heard extremities: Bilateral lower extremity edema present; no clubbing  Data  Reviewed: I have personally reviewed following labs and imaging studies  CBC: Recent Labs  Lab 11/18/20 0649  WBC 10.6*  NEUTROABS 6.9  HGB 10.6*  HCT 35.7*  MCV 87.1  PLT 482*    Basic Metabolic Panel: Recent Labs  Lab 11/14/20 0435 11/18/20 0649 11/19/20 0501  NA  --  138 139  K  --  3.9 3.6  CL  --  102 98  CO2  --  27 29  GLUCOSE  --  88 88  BUN  --  20 19  CREATININE 1.29* 1.40* 1.24*  CALCIUM  --  9.3 9.7    GFR: Estimated Creatinine Clearance: 100.8 mL/min (A) (by C-G formula based on SCr of 1.24 mg/dL (H)). Liver Function Tests: No results for input(s): AST, ALT, ALKPHOS, BILITOT, PROT, ALBUMIN in the last 168 hours. No results for input(s): LIPASE, AMYLASE in the last 168 hours. No results for input(s): AMMONIA in the last 168 hours. Coagulation Profile: No results for input(s): INR, PROTIME in the last 168 hours. Cardiac Enzymes: No results for input(s): CKTOTAL, CKMB, CKMBINDEX, TROPONINI in the last 168 hours. BNP (last 3 results) No results for input(s): PROBNP in the last 8760 hours. HbA1C: No results for input(s): HGBA1C in the last 72 hours. CBG: No results for input(s): GLUCAP in the last 168 hours. Lipid Profile: No results for input(s): CHOL, HDL, LDLCALC, TRIG, CHOLHDL, LDLDIRECT in the last 72 hours. Thyroid Function Tests: No results for input(s): TSH, T4TOTAL, FREET4, T3FREE, THYROIDAB in the last 72 hours. Anemia Panel: No results for input(s): VITAMINB12, FOLATE, FERRITIN, TIBC, IRON, RETICCTPCT in the last 72 hours. Sepsis Labs: No results for input(s): PROCALCITON, LATICACIDVEN in the last 168 hours.  No results found for this or any previous visit (from the past 240 hour(s)).       Radiology Studies: No results found.      Scheduled Meds:  ascorbic acid  500 mg Oral Daily   buPROPion ER  200 mg Oral BID   calcium carbonate  400 mg of elemental calcium Oral BID WC   clotrimazole   Topical BID   enoxaparin (LOVENOX)  injection  100 mg Subcutaneous Q24H   fluticasone  2 spray Each Nare BID   furosemide  40 mg Intravenous Daily   gabapentin  200 mg Oral TID   montelukast  10 mg Oral QHS   nystatin   Topical BID   pantoprazole  40 mg Oral Daily   rOPINIRole  4 mg Oral QHS   senna-docusate  2 tablet Oral Daily   sodium chloride flush  3 mL Intravenous Q12H   traZODone  50 mg Oral QHS   Continuous Infusions:  sodium chloride            Aline August, MD [facility] 11/19/2020, 8:20 AM

## 2020-11-19 NOTE — Plan of Care (Signed)
  Problem: Health Behavior/Discharge Planning: Goal: Ability to manage health-related needs will improve Outcome: Progressing   Problem: Activity: Goal: Risk for activity intolerance will decrease Outcome: Progressing   Problem: Skin Integrity: Goal: Risk for impaired skin integrity will decrease Outcome: Progressing   

## 2020-11-20 DIAGNOSIS — N390 Urinary tract infection, site not specified: Secondary | ICD-10-CM | POA: Diagnosis not present

## 2020-11-20 DIAGNOSIS — F331 Major depressive disorder, recurrent, moderate: Secondary | ICD-10-CM | POA: Diagnosis not present

## 2020-11-20 MED ORDER — IPRATROPIUM-ALBUTEROL 0.5-2.5 (3) MG/3ML IN SOLN
3.0000 mL | RESPIRATORY_TRACT | Status: DC | PRN
  Administered 2020-11-20 – 2020-12-05 (×3): 3 mL via RESPIRATORY_TRACT
  Filled 2020-11-20 (×3): qty 3

## 2020-11-20 NOTE — Progress Notes (Signed)
Patient ID: Heather [name], female   DOB: [birth year], 45 y.o.   MRN: [id number]  PROGRESS NOTE    Mattingly Neimeyer Bowen  [id number] DOB: 10/14/1975 DOA: 10/30/2020 PCP: Lucianne Lei, MD   Brief Narrative:  45 year old female with morbid obesity, obstructive sleep apnea, COPD, MDD, GAD, insomnia and RLS, diabetes mellitus type 2,renal mass,  and numerous small wounds under her pannus, upper thighs and between her legs presented with right flank and suprapubic pain along with nausea and vomiting.  She was admitted and treated for complicated UTI with antibiotics and has completed treatment.  Currently medically stable for discharge to SNF.  Assessment & Plan:   Complicated UTI -Received 3 days of cefepime.  Later felt to be colonization.  Currently off antibiotics  Nephrolithiasis -Outpatient follow-up with urology  Suicidal ideation/anxiety and depression -Psychiatry has cleared the patient for discharge.  Continue Wellbutrin, trazodone and gabapentin.  Continue as needed clonazepam.  Outpatient follow-up with psychiatry  Chronically disease stage III B--creatinine stable.  Monitor intermittently. -Patient states that she is not having that much urine output and retaining fluid and gaining weight.  She states that she used to be on indapamide in the past.  Started on Lasix IV 40 mg daily on 11/17/2020.  Switch to oral Lasix from tomorrow onwards.  Strict input and output.  Daily weights.  Monitor creatinine.  Generalized deconditioning Homelessness -TOC following.  Will need SNF placement.  Panniculitis -Significantly improved  Asthma -Stable  Morbid obesity -Outpatient follow-up    DVT prophylaxis: Lovenox Code Status: Full Family Communication: None Disposition Plan: Status is: Inpatient  Remains inpatient appropriate because:Unsafe d/c plan  Dispo: The patient is from: Homeless              Anticipated d/c is to: SNF              Patient currently is medically  stable to d/c.   Difficult to place patient Yes  Consultants: Psychiatry  Procedures: None  Antimicrobials:  Anti-infectives (From admission, onward)    Start     Dose/Rate Route Frequency Ordered Stop   11/01/20 1000  ceFEPIme (MAXIPIME) 2 g in sodium chloride 0.9 % 100 mL IVPB        2 g 200 mL/hr over 30 Minutes Intravenous Every 12 hours 10/31/20 1550 11/03/20 2309   11/01/20 0600  ceFEPIme (MAXIPIME) 2 g in sodium chloride 0.9 % 100 mL IVPB  Status:  Discontinued        2 g 200 mL/hr over 30 Minutes Intravenous Every 8 hours 10/31/20 1541 10/31/20 1550   10/31/20 1000  ertapenem (INVANZ) 1,000 mg in sodium chloride 0.9 % 100 mL IVPB  Status:  Discontinued        1 g 200 mL/hr over 30 Minutes Intravenous Every 24 hours 10/31/20 0948 10/31/20 1541        Subjective: Patient seen and examined at bedside.  No overnight worsening shortness of breath, chest pain, fever or vomiting reported.   Objective: Vitals:   11/19/20 1000 11/19/20 1642 11/19/20 2042 11/20/20 0438  BP:  121/80 128/73 108/65  Pulse:  84 79 70  Resp:  '18 20 18  '$ Temp:  98.5 F (36.9 C) 98.1 F (36.7 C) 97.6 F (36.4 C)  TempSrc:  Oral Oral Oral  SpO2:  96% 95% 98%  Weight: (!) 187.3 kg     Height:        Intake/Output Summary (Last 24 hours) at 11/20/2020 0818 Last data filed at  11/20/2020 0600 Gross per 24 hour  Intake 1120 ml  Output 700 ml  Net 420 ml    Filed Weights   11/18/20 1127 11/19/20 0526 11/19/20 1000  Weight: (!) 191.4 kg (!) 191.4 kg (!) 187.3 kg    Examination:  General exam: No distress.  On room air currently.  Looks chronically ill and deconditioned Respiratory system: Decreased breath sounds at bases bilaterally with some crackles  cardiovascular system: Rate controlled, S1-S2 heard  gastrointestinal system: Abdomen is morbidly obese, distended slightly, soft and nontender.  Bowel sounds are heard  extremities: No cyanosis; lower extremity edema present  bilaterally  Data Reviewed: I have personally reviewed following labs and imaging studies  CBC: Recent Labs  Lab 11/18/20 0649  WBC 10.6*  NEUTROABS 6.9  HGB 10.6*  HCT 35.7*  MCV 87.1  PLT 482*    Basic Metabolic Panel: Recent Labs  Lab 11/14/20 0435 11/18/20 0649 11/19/20 0501  NA  --  138 139  K  --  3.9 3.6  CL  --  102 98  CO2  --  27 29  GLUCOSE  --  88 88  BUN  --  20 19  CREATININE 1.29* 1.40* 1.24*  CALCIUM  --  9.3 9.7    GFR: Estimated Creatinine Clearance: 99.3 mL/min (A) (by C-G formula based on SCr of 1.24 mg/dL (H)). Liver Function Tests: No results for input(s): AST, ALT, ALKPHOS, BILITOT, PROT, ALBUMIN in the last 168 hours. No results for input(s): LIPASE, AMYLASE in the last 168 hours. No results for input(s): AMMONIA in the last 168 hours. Coagulation Profile: No results for input(s): INR, PROTIME in the last 168 hours. Cardiac Enzymes: No results for input(s): CKTOTAL, CKMB, CKMBINDEX, TROPONINI in the last 168 hours. BNP (last 3 results) No results for input(s): PROBNP in the last 8760 hours. HbA1C: No results for input(s): HGBA1C in the last 72 hours. CBG: No results for input(s): GLUCAP in the last 168 hours. Lipid Profile: No results for input(s): CHOL, HDL, LDLCALC, TRIG, CHOLHDL, LDLDIRECT in the last 72 hours. Thyroid Function Tests: No results for input(s): TSH, T4TOTAL, FREET4, T3FREE, THYROIDAB in the last 72 hours. Anemia Panel: No results for input(s): VITAMINB12, FOLATE, FERRITIN, TIBC, IRON, RETICCTPCT in the last 72 hours. Sepsis Labs: No results for input(s): PROCALCITON, LATICACIDVEN in the last 168 hours.  No results found for this or any previous visit (from the past 240 hour(s)).       Radiology Studies: No results found.      Scheduled Meds:  ascorbic acid  500 mg Oral Daily   buPROPion ER  200 mg Oral BID   calcium carbonate  400 mg of elemental calcium Oral BID WC   clotrimazole   Topical BID    enoxaparin (LOVENOX) injection  100 mg Subcutaneous Q24H   fluticasone  2 spray Each Nare BID   furosemide  40 mg Intravenous Daily   gabapentin  200 mg Oral TID   montelukast  10 mg Oral QHS   nystatin   Topical BID   pantoprazole  40 mg Oral Daily   rOPINIRole  4 mg Oral QHS   senna-docusate  2 tablet Oral Daily   sodium chloride flush  3 mL Intravenous Q12H   traZODone  50 mg Oral QHS   Continuous Infusions:  sodium chloride            Aline August, MD [facility] 11/20/2020, 8:18 AM

## 2020-11-20 NOTE — Plan of Care (Signed)
  Problem: Activity: Goal: Risk for activity intolerance will decrease Outcome: Progressing   Problem: Skin Integrity: Goal: Risk for impaired skin integrity will decrease Outcome: Progressing   

## 2020-11-20 NOTE — TOC Progression Note (Signed)
Transition of Care [facility]) - Progression Note    Patient Details  Name: Heather [name] MRN: [id number] Date of Birth: 1975-05-09  Transition of [facility]) CM/SW Contact  Heather Salina Mila Homer, LCSW Phone Number: 11/20/2020, 4:05 PM  Clinical Narrative:   Talked with patient at the bedside regarding her SSA/SSI and her family (her mother and children). Patient explained that her [place] phone hearing was last Wednesday (8/10), however her phone was out of service. She called later and was put on hold, then got disconnected. She added that she was able to talk with someone later and her hearing has been rescheduled to 12/28/20. Ms. [name] added that she was told to put in applications for SSA and SSI online and has done so on Friday, 8/12. CSW was shown on [name]'s phone the [facility] notice that indicated 0% of her application has been processed. Also discussed with patient the ongoing facility search and the barrier of her having regular Medicaid and this needing to be changed to Long-Term Care (LTC) Medicaid and this was discussed. Patient informed that CSW made contact with a [facility] financial counselor regarding getting her Medicaid changed to LTC. [name] that her current Medicaid worker is Heather [name] [id number].  Patient also talked with CSW regarding her children and the family that now has them, her mother and her issues and their issues as Patent attorney; and her currently being in school (online) - [employer] cabin crew.      Expected Discharge Plan: Skilled Nursing Facility Barriers to Discharge: [place] ([place]), Homeless with medical needs (Homeless)  Expected Discharge Plan and Services Expected Discharge Plan: [place] In-house Referral: Clinical Social Work   Post Acute Care Choice: [facility] (Needs LTC bed) Living arrangements for the past 2 months: Homeless Expected Discharge Date: 11/02/20                                     Social Determinants of Health (SDOH) Interventions  Patient is currently homeless and is needs Long-term Care (LTC) Medicaid to transition to a LTC facility if one can be located.  Readmission Risk Interventions No flowsheet data found.

## 2020-11-20 NOTE — Progress Notes (Signed)
Physical Therapy Treatment Patient Details Name: Heather [name] MRN: [id number] DOB: September 19, 1975 Today's Date: 11/20/2020    History of Present Illness 45 y.o. female presented to ED for chest pain and palpitations and feeling like her throat was closing while at her behavioral health facility for SI. Admitted 10/31/20 for treatment of complicated UTI PMH: anxiety and depression (hx of bipolar and schizoaffective disorder in chart in past), ACD, panniculitis, debility,GERD,  OSA not on cpap, chronic back pain.    PT Comments    Pt supine in bed on entry, with complaints of not wanting to get up so early. Eventually agreeable to get up but she will not sit in recliner. Pt suggests she will sit on couch in her room. Hart set up with chair alarm. Pt continues to be limited in safe mobility by decreased safety awareness and knowledge of her deficits, in presence of generalized weakness and decreased endurance. Pt is current min guard for bed mobility, min guard for transfers and min guard for ambulation with Rill chair follow due to decreased endurance. Pt left on BSC, NT notified that she is to sit on couch after she is finished on BSC. Heather [name] has been equipped with chair alarm.    Follow Up Recommendations  SNF     Equipment Recommendations  None recommended by PT (has RW, left BSC at hotel)    Recommendations for Other Services OT consult     Precautions / Restrictions Precautions Precautions: Fall Precaution Comments: fall prior to hospitalization Restrictions Weight Bearing Restrictions: No    Mobility  Bed Mobility Overal bed mobility: Needs Assistance Bed Mobility: Supine to Sit     Supine to sit: HOB elevated;Min guard     General bed mobility comments: bed deflated to improve mobility, HoB elevated and pt able to move to EoB with min guard for safety,pt with increased time and effort required.    Transfers Overall transfer level: Needs assistance Equipment used:  Rolling walker (2 wheeled) Transfers: Sit to/from Stand Sit to Stand: +2 physical assistance;From elevated surface;Min guard         General transfer comment: min guard to power up to RW with bed deflated, requires maximal momentum to come to standing from recliner  Ambulation/Gait Ambulation/Gait assistance: +2 safety/equipment;Min guard Gait Distance (Feet): 50 Feet (x2) Assistive device: Rolling walker (2 wheeled) Gait Pattern/deviations: Decreased step length - right;Decreased step length - left;Wide base of support;Antalgic;Decreased weight shift to right;Step-through pattern Gait velocity: slowed Gait velocity interpretation: <1.8 ft/sec, indicate of risk for recurrent falls General Gait Details: pt ambulated 2 bouts of 50 feet, had to return to room to use [facility]        Balance Overall balance assessment: Needs assistance Sitting-balance support: Feet supported;No upper extremity supported Sitting balance-Leahy Scale: Fair     Standing balance support: Bilateral upper extremity supported;During functional activity;No upper extremity supported;Single extremity supported Standing balance-Leahy Scale: Poor Standing balance comment: able to static stand and don mask                            Cognition Arousal/Alertness: Awake/alert Behavior During Therapy: WFL for tasks assessed/performed Overall Cognitive Status: Impaired/Different from baseline Area of Impairment: Safety/judgement;Problem solving                         Safety/Judgement: Decreased awareness of safety;Decreased awareness of deficits   Problem Solving: Requires verbal cues;Requires tactile cues General Comments:  has poor understanding of why she needs to be out of the bed despite education on need for getting out of bed for digestive and pulmonary function as well as to keep core muscle strength         General Comments General comments (skin integrity, edema, etc.): During rest  break pt speaks with CSW about missed disability call and rescheduled social security call. Pt only agreeable to sitting on couch not the recliner. Couch set up with chair alarm and pt provided with call bell to call for assist to get back to bed.      Pertinent Vitals/Pain Pain Assessment: No/denies pain     PT Goals (current goals can now be found in the care plan section) Acute Rehab PT Goals Patient Stated Goal: be able to get around on her own PT Goal Formulation: With patient Time For Goal Achievement: 11/16/20 Potential to Achieve Goals: Fair Progress towards PT goals: Progressing toward goals    Frequency    Min 2X/week      PT Plan Current plan remains appropriate       AM-PAC PT "6 Clicks" Mobility   Outcome Measure  Help needed turning from your back to your side while in a flat bed without using bedrails?: None Help needed moving from lying on your back to sitting on the side of a flat bed without using bedrails?: A Lot Help needed moving to and from a bed to a chair (including a wheelchair)?: Total Help needed standing up from a chair using your arms (e.g., wheelchair or bedside chair)?: Total Help needed to walk in hospital room?: Total Help needed climbing 3-5 steps with a railing? : Total 6 Click Score: 10    End of Session   Activity Tolerance: Patient tolerated treatment well Patient left: with call bell/phone within reach;Other (comment);with chair alarm set (on couch) Nurse Communication: Mobility status PT Visit Diagnosis: Unsteadiness on feet (R26.81);Other abnormalities of gait and mobility (R26.89);History of falling (Z91.81);Muscle weakness (generalized) (M62.81);Difficulty in walking, not elsewhere classified (R26.2);Pain Pain - Right/Left: Right Pain - part of body: Knee (back)     Time: [id number] PT Time Calculation (min) (ACUTE ONLY): 30 min  Charges:  $Gait Training: 8-22 mins $Therapeutic Activity: 8-22 mins                      Heather [name] B. Migdalia Dk PT, DPT Acute Rehabilitation Services Pager [phone] Office [phone]    [place] 11/20/2020, 9:24 AM

## 2020-11-21 DIAGNOSIS — F331 Major depressive disorder, recurrent, moderate: Secondary | ICD-10-CM | POA: Diagnosis not present

## 2020-11-21 DIAGNOSIS — N183 Chronic kidney disease, stage 3 unspecified: Secondary | ICD-10-CM | POA: Diagnosis not present

## 2020-11-21 LAB — CREATININE, SERUM
Creatinine, Ser: 1.4 mg/dL — ABNORMAL HIGH (ref 0.44–1.00)
GFR, Estimated: 48 mL/min — ABNORMAL LOW (ref >60)

## 2020-11-21 MED ORDER — FUROSEMIDE 40 MG PO TABS
40.0000 mg | ORAL_TABLET | Freq: Every day | ORAL | Status: DC
  Administered 2020-11-21 – 2020-12-08 (×18): 40 mg via ORAL
  Filled 2020-11-21 (×18): qty 1

## 2020-11-21 NOTE — Progress Notes (Signed)
Occupational Therapy Treatment Patient Details Name: Shyasia Zermeno Ettinger MRN: [id number] DOB: 03/10/1976 Today's Date: 11/21/2020    History of present illness 45 y.o. female presented to ED for chest pain and palpitations and feeling like her throat was closing while at her behavioral health facility for SI. Admitted 10/31/20 for treatment of complicated UTI PMH: anxiety and depression (hx of bipolar and schizoaffective disorder in chart in past), ACD, panniculitis, debility,GERD,  OSA not on cpap, chronic back pain.   OT comments  Pt. Was cooperative with therapy. Pt. Was able to sit eob to preform ADLs with use of AE. Pt. Was ed on correct use of AE for LE dressing task. Pt. Was also able to sit eob for B UE HEP. Acute OT to follow to increase pt. Functional level.   Follow Up Recommendations  SNF;Supervision/Assistance - 24 hour    Equipment Recommendations       Recommendations for Other Services      Precautions / Restrictions Precautions Precautions: Fall Precaution Comments: fall prior to hospitalization Restrictions Weight Bearing Restrictions: No       Mobility Bed Mobility Overal bed mobility: Needs Assistance       Supine to sit: Supervision Sit to supine: Supervision   General bed mobility comments: pt. uses bed control to elevate and lower head of bed.    Transfers Overall transfer level: Needs assistance                    Balance     Sitting balance-Leahy Scale: Fair                                     ADL either performed or assessed with clinical judgement   ADL Overall ADL's : Needs assistance/impaired Eating/Feeding: Independent;Bed level   Grooming: Minimal assistance;Sitting           Upper Body Dressing : Set up;Sitting   Lower Body Dressing: With adaptive equipment;Moderate assistance (to don socks and start pants over feet with ae)               Functional mobility during ADLs:  (Pt. deferred OOB  secondary to LE pain.) General ADL Comments: Pt. ed on ADLs with use of AE while sitting eob.     Vision   Vision Assessment?: No apparent visual deficits   Perception     Praxis      Cognition Arousal/Alertness: Awake/alert Behavior During Therapy: WFL for tasks assessed/performed Overall Cognitive Status: Within Functional Limits for tasks assessed                                          Exercises Other Exercises Other Exercises: b UE exercises while sitting eob.   Shoulder Instructions       General Comments      Pertinent Vitals/ Pain       Pain Assessment: 0-10 Pain Score: 8  Pain Location: r leg Pain Descriptors / Indicators: Sharp Pain Intervention(s): Premedicated before session  Home Living                                          Prior Functioning/Environment  Frequency           Progress Toward Goals  OT Goals(current goals can now be found in the care plan section)  Progress towards OT goals: Progressing toward goals  Acute Rehab OT Goals Patient Stated Goal: to get better and be with my kids OT Goal Formulation: With patient Time For Goal Achievement: 11/24/20 Potential to Achieve Goals: Fair ADL Goals Pt Will Perform Grooming: with set-up;sitting Pt Will Perform Lower Body Bathing: with set-up;with min guard assist;sitting/lateral leans;sit to/from stand;with adaptive equipment Pt Will Perform Lower Body Dressing: with min guard assist;with adaptive equipment;sitting/lateral leans;sit to/from stand Pt Will Transfer to Toilet: with supervision;bedside commode;stand pivot transfer;ambulating Pt Will Perform Toileting - Clothing Manipulation and hygiene: with supervision;with min guard assist;sitting/lateral leans;sit to/from stand;with adaptive equipment Pt Will Perform Tub/Shower Transfer: Tub transfer;with min guard assist;tub bench;rolling walker  Plan Discharge plan remains appropriate     Co-evaluation                 AM-PAC OT "6 Clicks" Daily Activity     Outcome Measure   Help from another person eating meals?: None Help from another person taking care of personal grooming?: A Little Help from another person toileting, which includes using toliet, bedpan, or urinal?: A Lot Help from another person bathing (including washing, rinsing, drying)?: A Lot Help from another person to put on and taking off regular upper body clothing?: A Little Help from another person to put on and taking off regular lower body clothing?: A Lot 6 Click Score: 16    End of Session    OT Visit Diagnosis: Unsteadiness on feet (R26.81);Other abnormalities of gait and mobility (R26.89);Muscle weakness (generalized) (M62.81);Pain;Other (comment)   Activity Tolerance Patient tolerated treatment well   Patient Left in bed;with call bell/phone within reach   Nurse Communication  (ok therapy)        Time: [id number] OT Time Calculation (min): 42 min  Charges: OT General Charges $OT Visit: 1 Visit OT Treatments $Self Care/Home Management : 23-37 mins $Therapeutic Exercise: 8-22 mins Reece Packer OT/L   Thaddeus Evitts 11/21/2020, 12:05 PM

## 2020-11-21 NOTE — Progress Notes (Signed)
Patient ID: Heather [name], female   DOB: 1975/08/26, 45 y.o.   MRN: [id number]  PROGRESS NOTE    Heather [name]  [id number] DOB: 09-May-1975 DOA: 10/30/2020 PCP: Lucianne Lei, MD   Brief Narrative:  45 year old female with morbid obesity, obstructive sleep apnea, COPD, MDD, GAD, insomnia and RLS, diabetes mellitus type 2,renal mass,  and numerous small wounds under her pannus, upper thighs and between her legs presented with right flank and suprapubic pain along with nausea and vomiting.  She was admitted and treated for complicated UTI with antibiotics and has completed treatment.  Currently medically stable for discharge to SNF.  Assessment & Plan:   Complicated UTI -Received 3 days of cefepime.  Later felt to be colonization.  Currently off antibiotics  Nephrolithiasis -Outpatient follow-up with urology  Suicidal ideation/anxiety and depression -Psychiatry has cleared the patient for discharge.  Continue Wellbutrin, trazodone and gabapentin.  Continue as needed clonazepam.  Outpatient follow-up with psychiatry  Chronically disease stage III B--creatinine stable.  Monitor intermittently. -Patient states that she is not having that much urine output and retaining fluid and gaining weight.  She states that she used to be on indapamide in the past.  Started on Lasix IV 40 mg daily on 11/17/2020.  Switch to oral Lasix from today onwards.  Strict input and output.  Daily weights.  Monitor creatinine.  Generalized deconditioning Homelessness -TOC following.  Will need SNF placement.  Panniculitis -Significantly improved  Asthma -Stable  Morbid obesity -Outpatient follow-up    DVT prophylaxis: Lovenox Code Status: Full Family Communication: None Disposition Plan: Status is: Inpatient  Remains inpatient appropriate because:Unsafe d/c plan  Dispo: The patient is from: Homeless              Anticipated d/c is to: SNF              Patient currently is medically stable  to d/c.   Difficult to place patient Yes  Consultants: Psychiatry  Procedures: None  Antimicrobials:  Anti-infectives (From admission, onward)    Start     Dose/Rate Route Frequency Ordered Stop   11/01/20 1000  ceFEPIme (MAXIPIME) 2 g in sodium chloride 0.9 % 100 mL IVPB        2 g 200 mL/hr over 30 Minutes Intravenous Every 12 hours 10/31/20 1550 11/03/20 2309   11/01/20 0600  ceFEPIme (MAXIPIME) 2 g in sodium chloride 0.9 % 100 mL IVPB  Status:  Discontinued        2 g 200 mL/hr over 30 Minutes Intravenous Every 8 hours 10/31/20 1541 10/31/20 1550   10/31/20 1000  ertapenem (INVANZ) 1,000 mg in sodium chloride 0.9 % 100 mL IVPB  Status:  Discontinued        1 g 200 mL/hr over 30 Minutes Intravenous Every 24 hours 10/31/20 0948 10/31/20 1541        Subjective: Patient seen and examined at bedside.  No fever, vomiting, worsening shortness of breath reported.  Complains of some burning in the urine. Objective: Vitals:   11/20/20 1736 11/20/20 2111 11/21/20 0500 11/21/20 0516  BP: 133/77 120/60  105/61  Pulse: 95 (!) 59  75  Resp: '20 18  18  '$ Temp: 99.3 F (37.4 C) 99.2 F (37.3 C)  97.9 F (36.6 C)  TempSrc: Oral Oral  Oral  SpO2: 98% 95%  90%  Weight:   (!) 187.8 kg   Height:        Intake/Output Summary (Last 24 hours) at 11/21/2020 0736 Last  data filed at 11/20/2020 1746 Gross per 24 hour  Intake 837 ml  Output 0 ml  Net 837 ml    Filed Weights   11/19/20 0526 11/19/20 1000 11/21/20 0500  Weight: (!) 191.4 kg (!) 187.3 kg (!) 187.8 kg    Examination:  General exam: Currently on room air; no distress.  Looks chronically ill and deconditioned Respiratory system: Bilateral decreased breath sounds in bases with some scattered crackles  cardiovascular system: S1-S2 heard; rate controlled gastrointestinal system: Abdomen is morbidly obese, mildly distended, soft and nontender.  Normal bowel sounds are heard  extremities: Bilateral lower extremity edema present;  no clubbing  Data Reviewed: I have personally reviewed following labs and imaging studies  CBC: Recent Labs  Lab 11/18/20 0649  WBC 10.6*  NEUTROABS 6.9  HGB 10.6*  HCT 35.7*  MCV 87.1  PLT 482*    Basic Metabolic Panel: Recent Labs  Lab 11/18/20 0649 11/19/20 0501  NA 138 139  K 3.9 3.6  CL 102 98  CO2 27 29  GLUCOSE 88 88  BUN 20 19  CREATININE 1.40* 1.24*  CALCIUM 9.3 9.7    GFR: Estimated Creatinine Clearance: 99.5 mL/min (A) (by C-G formula based on SCr of 1.24 mg/dL (H)). Liver Function Tests: No results for input(s): AST, ALT, ALKPHOS, BILITOT, PROT, ALBUMIN in the last 168 hours. No results for input(s): LIPASE, AMYLASE in the last 168 hours. No results for input(s): AMMONIA in the last 168 hours. Coagulation Profile: No results for input(s): INR, PROTIME in the last 168 hours. Cardiac Enzymes: No results for input(s): CKTOTAL, CKMB, CKMBINDEX, TROPONINI in the last 168 hours. BNP (last 3 results) No results for input(s): PROBNP in the last 8760 hours. HbA1C: No results for input(s): HGBA1C in the last 72 hours. CBG: No results for input(s): GLUCAP in the last 168 hours. Lipid Profile: No results for input(s): CHOL, HDL, LDLCALC, TRIG, CHOLHDL, LDLDIRECT in the last 72 hours. Thyroid Function Tests: No results for input(s): TSH, T4TOTAL, FREET4, T3FREE, THYROIDAB in the last 72 hours. Anemia Panel: No results for input(s): VITAMINB12, FOLATE, FERRITIN, TIBC, IRON, RETICCTPCT in the last 72 hours. Sepsis Labs: No results for input(s): PROCALCITON, LATICACIDVEN in the last 168 hours.  No results found for this or any previous visit (from the past 240 hour(s)).       Radiology Studies: No results found.      Scheduled Meds:  ascorbic acid  500 mg Oral Daily   buPROPion ER  200 mg Oral BID   clotrimazole   Topical BID   enoxaparin (LOVENOX) injection  100 mg Subcutaneous Q24H   fluticasone  2 spray Each Nare BID   furosemide  40 mg  Intravenous Daily   gabapentin  200 mg Oral TID   montelukast  10 mg Oral QHS   nystatin   Topical BID   pantoprazole  40 mg Oral Daily   rOPINIRole  4 mg Oral QHS   senna-docusate  2 tablet Oral Daily   sodium chloride flush  3 mL Intravenous Q12H   traZODone  50 mg Oral QHS   Continuous Infusions:  sodium chloride            Aline August, MD [facility] 11/21/2020, 7:36 AM

## 2020-11-21 NOTE — Plan of Care (Signed)
  Problem: Health Behavior/Discharge Planning: Goal: Ability to manage health-related needs will improve Outcome: Progressing   Problem: Activity: Goal: Risk for activity intolerance will decrease Outcome: Progressing   Problem: Skin Integrity: Goal: Risk for impaired skin integrity will decrease Outcome: Progressing   

## 2020-11-22 DIAGNOSIS — N390 Urinary tract infection, site not specified: Secondary | ICD-10-CM | POA: Diagnosis not present

## 2020-11-22 LAB — BASIC METABOLIC PANEL
Anion gap: 10 (ref 5–15)
BUN: 21 mg/dL — ABNORMAL HIGH (ref 6–20)
CO2: 26 mmol/L (ref 22–32)
Calcium: 9.4 mg/dL (ref 8.9–10.3)
Chloride: 101 mmol/L (ref 98–111)
Creatinine, Ser: 1.3 mg/dL — ABNORMAL HIGH (ref 0.44–1.00)
GFR, Estimated: 53 mL/min — ABNORMAL LOW (ref >60)
Glucose, Bld: 84 mg/dL (ref 70–99)
Potassium: 3.5 mmol/L (ref 3.5–5.1)
Sodium: 137 mmol/L (ref 135–145)

## 2020-11-22 NOTE — Progress Notes (Signed)
Physical Therapy Treatment Patient Details Name: Heather [name] MRN: [id number] DOB: [birth year] Today's Date: 11/22/2020    History of Present Illness 45 y.o. female presented to ED for chest pain and palpitations and feeling like her throat was closing while at her behavioral health facility for SI. Admitted 10/31/20 for treatment of complicated UTI PMH: anxiety and depression (hx of bipolar and schizoaffective disorder in chart in past), ACD, panniculitis, debility,GERD,  OSA not on cpap, chronic back pain.    PT Comments    Pt requires maximal encouragement to get out of bed, describing "feeling bad." Again educated on need for OOB for eating to improve digestion and pulmonary function as well as maintaining strength. Pt reports recliner is uncomfortable. Request pt to get up long enough to eat lunch. Pt report she sits up in bed, however PT responds HoB elevation max in not as upright as in the chair and requires minimal core activation. Eventually pt agreeable to  getting out of bed. Pt requires greater assist for mobility today, needing min guard to come to EOB, however requires modAx2 for coming to standing and minAx2 for stepping pivot transfer to [facility]. D/c plans remain appropriate at this time. PT will continue to follow acutely.     Follow Up Recommendations  SNF     Equipment Recommendations  None recommended by PT (has RW, left BSC at hotel)    Recommendations for Other Services OT consult     Precautions / Restrictions Precautions Precautions: Fall Precaution Comments: fall prior to hospitalization Restrictions Weight Bearing Restrictions: No    Mobility  Bed Mobility Overal bed mobility: Needs Assistance Bed Mobility: Supine to Sit     Supine to sit: HOB elevated;Min guard     General bed mobility comments: bed deflated to improve mobility, HoB elevated and pt able to move to EoB with min guard for safety,pt with increased time and effort required.     Transfers Overall transfer level: Needs assistance Equipment used: Rolling walker (2 wheeled) Transfers: Sit to/from Stand Sit to Stand: +2 physical assistance;From elevated surface;Mod assist         General transfer comment: requires modAx2 for coming to standing EoB, pt with c/o of R LE pain  Ambulation/Gait Ambulation/Gait assistance: +2 safety/equipment;Min assist Gait Distance (Feet): 2 Feet Assistive device: Rolling walker (2 wheeled) Gait Pattern/deviations: Decreased step length - right;Decreased step length - left;Wide base of support;Antalgic;Decreased weight shift to right;Step-through pattern Gait velocity: slowed Gait velocity interpretation: <1.31 ft/sec, indicative of household ambulator General Gait Details: pt requires min Ax2 for steadying with RW, for stepping transfer to [facility].         Balance Overall balance assessment: Needs assistance Sitting-balance support: Feet supported;No upper extremity supported Sitting balance-Leahy Scale: Fair     Standing balance support: Bilateral upper extremity supported;During functional activity;No upper extremity supported;Single extremity supported Standing balance-Leahy Scale: Poor                              Cognition Arousal/Alertness: Awake/alert Behavior During Therapy: WFL for tasks assessed/performed Overall Cognitive Status: Impaired/Different from baseline Area of Impairment: Safety/judgement;Problem solving                         Safety/Judgement: Decreased awareness of safety;Decreased awareness of deficits   Problem Solving: Requires verbal cues;Requires tactile cues General Comments: continues to have poor insight into strength loss occuring with not getting out  of bed. Also the need for an finding housing for after short term SNF for rehab         General Comments General comments (skin integrity, edema, etc.): Pt reports overall malaise and is unenthusiastic about  getting up, again educated pt on need for OOB to improve overall mobility. Eventually agreeable to get up to recliner only to eat lunch, however with standing requests to transfer to [facility] [place] instead      Pertinent Vitals/Pain Pain Assessment: Faces Faces Pain Scale: Hurts a little bit Pain Location: r leg Pain Descriptors / Indicators: Grimacing;Guarding Pain Intervention(s): Limited activity within patient's tolerance;Monitored during session;Repositioned     PT Goals (current goals can now be found in the care plan section) Acute Rehab PT Goals Patient Stated Goal: be able to get around on her own PT Goal Formulation: With patient Time For Goal Achievement: 11/16/20 Potential to Achieve Goals: Fair Progress towards PT goals: Not progressing toward goals - comment (pt reports "feeling bad")    Frequency    Min 2X/week      PT Plan Current plan remains appropriate       AM-PAC PT "6 Clicks" Mobility   Outcome Measure  Help needed turning from your back to your side while in a flat bed without using bedrails?: None Help needed moving from lying on your back to sitting on the side of a flat bed without using bedrails?: A Lot Help needed moving to and from a bed to a chair (including a wheelchair)?: Total Help needed standing up from a chair using your arms (e.g., wheelchair or bedside chair)?: Total Help needed to walk in hospital room?: Total Help needed climbing 3-5 steps with a railing? : Total 6 Click Score: 10    End of Session   Activity Tolerance: Patient tolerated treatment well Patient left: with call bell/phone within reach (on [facility] NT aware) Nurse Communication: Mobility status PT Visit Diagnosis: Unsteadiness on feet (R26.81);Other abnormalities of gait and mobility (R26.89);History of falling (Z91.81);Muscle weakness (generalized) (M62.81);Difficulty in walking, not elsewhere classified (R26.2);Pain Pain - Right/Left: Right Pain - part of body: Knee (back)      Time: [id number] PT Time Calculation (min) (ACUTE ONLY): 18 min  Charges:  $Therapeutic Activity: 8-22 mins                     Gordie Crumby B. Migdalia Dk PT, DPT Acute Rehabilitation Services Pager [phone] Office [phone]    [place] 11/22/2020, 2:36 PM

## 2020-11-22 NOTE — Progress Notes (Signed)
PROGRESS NOTE    Heather [name]  [id number] DOB: 09-Jun-1975 DOA: 10/30/2020 PCP: Lucianne Lei, MD   Brief Narrative:  This 45 year old female with morbid obesity, obstructive sleep apnea, COPD, MDD, GAD, insomnia and RLS, diabetes mellitus type 2,renal mass,  and numerous small wounds under her pannus, upper thighs and between her legs presented with right flank and suprapubic pain along with nausea and vomiting.  She was admitted and treated for complicated UTI with antibiotics and has completed treatment.  Currently medically stable for discharge to SNF.  Assessment & Plan:   Principal Problem:   Complicated UTI (urinary tract infection) Active Problems:   Asthma   PALPITATIONS, OCCASIONAL   Panniculitis   OSA (obstructive sleep apnea)   Generalized anxiety disorder   Major depression, chronic   Thrombocytosis   Hypokalemia   CKD (chronic kidney disease), stage III ([place])  Complicated UTI: Patient has received 3 days of cefepime, urine culture found to be colonization,  currently off antibiotics Continue to monitor off antibiotics   Nephrolithiasis Outpatient follow-up with urology   Suicidal ideation/anxiety and depression Psychiatry has cleared the patient for discharge.   Continue Wellbutrin, trazodone and gabapentin.   Continue as needed clonazepam.  Outpatient follow-up with psychiatry   Chronically disease stage III B--creatinine stable.  Monitor intermittently. Patient states that she is not having that much urine output and retaining fluid and gaining weight.   She states that she used to be on indapamide in the past.   She was started on Lasix IV 40 mg daily on 11/17/2020.   Switch to oral Lasix from today onwards.  Strict input and output.  Daily weights.  Monitor creatinine.   Generalized deconditioning Homelessness TOC following.  Will need SNF placement.   Panniculitis Significantly improved   Asthma Stable, continue current inhalers.   Morbid  obesity Outpatient follow-up    DVT prophylaxis: Lovenox Code Status: Full code. Family Communication: No family at bed side. Disposition Plan:   Status is: Inpatient  Remains inpatient appropriate because:Unsafe d/c plan  Dispo:  Patient From: Homeless  Planned Disposition: [place]  Medically stable for discharge: Yes    Consultants:  Psychiatry  Procedures: None  Antimicrobials:  Anti-infectives (From admission, onward)    Start     Dose/Rate Route Frequency Ordered Stop   11/01/20 1000  ceFEPIme (MAXIPIME) 2 g in sodium chloride 0.9 % 100 mL IVPB        2 g 200 mL/hr over 30 Minutes Intravenous Every 12 hours 10/31/20 1550 11/03/20 2309   11/01/20 0600  ceFEPIme (MAXIPIME) 2 g in sodium chloride 0.9 % 100 mL IVPB  Status:  Discontinued        2 g 200 mL/hr over 30 Minutes Intravenous Every 8 hours 10/31/20 1541 10/31/20 1550   10/31/20 1000  ertapenem (INVANZ) 1,000 mg in sodium chloride 0.9 % 100 mL IVPB  Status:  Discontinued        1 g 200 mL/hr over 30 Minutes Intravenous Every 24 hours 10/31/20 0948 10/31/20 1541        Subjective: Patient was seen and examined at bedside.  Overnight events noted.   Patient denies any chest pain or shortness of breath.  She reports sore throat but denies any difficulty swallowing.  Objective: Vitals:   11/21/20 1752 11/21/20 2121 11/22/20 0500 11/22/20 0905  BP: 125/78 113/68 107/63 119/64  Pulse: 82 83 70 83  Resp: '18 18 18 18  '$ Temp: 98.3 F (36.8 C) 98.8  F (37.1 C) 97.8 F (36.6 C) 98.3 F (36.8 C)  TempSrc:  Oral Oral Oral  SpO2: 94% 93% 93% 95%  Weight:      Height:        Intake/Output Summary (Last 24 hours) at 11/22/2020 1526 Last data filed at 11/22/2020 1300 Gross per 24 hour  Intake 1400 ml  Output 0 ml  Net 1400 ml   Filed Weights   11/19/20 0526 11/19/20 1000 11/21/20 0500  Weight: (!) 191.4 kg (!) 187.3 kg (!) 187.8 kg    Examination:  General exam: Appears comfortable,  lying in the bed.  Chronically ill looking and deconditioned. Respiratory system: Bilaterally decreased breath sounds in bases , respiratory effort normal. Cardiovascular system: S1 & S2 heard, RRR. No JVD, murmurs, rubs, gallops or clicks. No pedal edema. Gastrointestinal system: Abdomen is nondistended, soft and nontender. No organomegaly or masses felt. Normal bowel sounds heard. Central nervous system: Alert and oriented. No focal neurological deficits. Extremities: Bilateral lower extremity edema present, no clubbing Skin: No rashes, lesions or ulcers Psychiatry: Judgement and insight appear normal. Mood & affect appropriate.     Data Reviewed: I have personally reviewed following labs and imaging studies  CBC: Recent Labs  Lab 11/18/20 0649  WBC 10.6*  NEUTROABS 6.9  HGB 10.6*  HCT 35.7*  MCV 87.1  PLT [id number]*   Basic Metabolic Panel: Recent Labs  Lab 11/18/20 0649 11/19/20 0501 11/21/20 0906 11/22/20 0305  NA 138 139  --  137  K 3.9 3.6  --  3.5  CL 102 98  --  101  CO2 27 29  --  26  GLUCOSE 88 88  --  84  BUN 20 19  --  21*  CREATININE 1.40* 1.24* 1.40* 1.30*  CALCIUM 9.3 9.7  --  9.4   GFR: Estimated Creatinine Clearance: 94.9 mL/min (A) (by C-G formula based on SCr of 1.3 mg/dL (H)). Liver Function Tests: No results for input(s): AST, ALT, ALKPHOS, BILITOT, PROT, ALBUMIN in the last 168 hours. No results for input(s): LIPASE, AMYLASE in the last 168 hours. No results for input(s): AMMONIA in the last 168 hours. Coagulation Profile: No results for input(s): INR, PROTIME in the last 168 hours. Cardiac Enzymes: No results for input(s): CKTOTAL, CKMB, CKMBINDEX, TROPONINI in the last 168 hours. BNP (last 3 results) No results for input(s): PROBNP in the last 8760 hours. HbA1C: No results for input(s): HGBA1C in the last 72 hours. CBG: No results for input(s): GLUCAP in the last 168 hours. Lipid Profile: No results for input(s): CHOL, HDL, LDLCALC, TRIG,  CHOLHDL, LDLDIRECT in the last 72 hours. Thyroid Function Tests: No results for input(s): TSH, T4TOTAL, FREET4, T3FREE, THYROIDAB in the last 72 hours. Anemia Panel: No results for input(s): VITAMINB12, FOLATE, FERRITIN, TIBC, IRON, RETICCTPCT in the last 72 hours. Sepsis Labs: No results for input(s): PROCALCITON, LATICACIDVEN in the last 168 hours.  No results found for this or any previous visit (from the past 240 hour(s)).    Radiology Studies: No results found.   Scheduled Meds:  ascorbic acid  500 mg Oral Daily   buPROPion ER  200 mg Oral BID   clotrimazole   Topical BID   enoxaparin (LOVENOX) injection  100 mg Subcutaneous Q24H   fluticasone  2 spray Each Nare BID   furosemide  40 mg Oral Daily   gabapentin  200 mg Oral TID   montelukast  10 mg Oral QHS   nystatin   Topical BID  pantoprazole  40 mg Oral Daily   rOPINIRole  4 mg Oral QHS   senna-docusate  2 tablet Oral Daily   sodium chloride flush  3 mL Intravenous Q12H   traZODone  50 mg Oral QHS   Continuous Infusions:  sodium chloride       LOS: 21 days    Time spent: 35 mins    Letanya Froh, MD [facility]   If 7PM-7AM, please contact night-coverage PRO

## 2020-11-23 DIAGNOSIS — N390 Urinary tract infection, site not specified: Secondary | ICD-10-CM | POA: Diagnosis not present

## 2020-11-23 LAB — CBC
HCT: 34.4 % — ABNORMAL LOW (ref 36.0–46.0)
Hemoglobin: 10.2 g/dL — ABNORMAL LOW (ref 12.0–15.0)
MCH: 25.4 pg — ABNORMAL LOW (ref 26.0–34.0)
MCHC: 29.7 g/dL — ABNORMAL LOW (ref 30.0–36.0)
MCV: 85.6 fL (ref 80.0–100.0)
Platelets: 413 10*3/uL — ABNORMAL HIGH (ref 150–400)
RBC: 4.02 MIL/uL (ref 3.87–5.11)
RDW: 17.2 % — ABNORMAL HIGH (ref 11.5–15.5)
WBC: 12.3 10*3/uL — ABNORMAL HIGH (ref 4.0–10.5)
nRBC: 0 % (ref 0.0–0.2)

## 2020-11-23 NOTE — Progress Notes (Signed)
PROGRESS NOTE    Heather [name]  [id number] DOB: 1975-09-25 DOA: 10/30/2020 PCP: Lucianne Lei, MD   Brief Narrative:  This 45 year old female with morbid obesity, obstructive sleep apnea, COPD, MDD, GAD, insomnia and RLS, diabetes mellitus type 2,renal mass,  and numerous small wounds under her pannus, upper thighs and between her legs presented with right flank and suprapubic pain along with nausea and vomiting.  She was admitted and treated for complicated UTI with antibiotics and has completed treatment.  Currently medically stable for discharge to SNF.  Assessment & Plan:   Principal Problem:   Complicated UTI (urinary tract infection) Active Problems:   Asthma   PALPITATIONS, OCCASIONAL   Panniculitis   OSA (obstructive sleep apnea)   Generalized anxiety disorder   Major depression, chronic   Thrombocytosis   Hypokalemia   CKD (chronic kidney disease), stage III ([place])  Complicated UTI: Patient has received 3 days of cefepime, urine culture found to be colonization,  currently off antibiotics Continue to monitor off antibiotics   Nephrolithiasis Outpatient follow-up with urology   Suicidal ideation/anxiety and depression Psychiatry has cleared the patient for discharge.   Continue Wellbutrin, trazodone and gabapentin.   Continue as needed clonazepam.  Outpatient follow-up with psychiatry   Chronically disease stage III B--creatinine stable.  Monitor intermittently. Patient states that she is not having that much urine output and retaining fluid and gaining weight.   She states that she used to be on indapamide in the past.   She was started on Lasix IV 40 mg daily on 11/17/2020.   Continue Lasix 40 mg daily , strict input and output.  Daily weights.  Monitor creatinine.   Generalized deconditioning Homelessness TOC following.  Will need SNF placement.   Panniculitis Significantly improved.   Asthma Stable, continue current inhalers.   Morbid  obesity Outpatient follow-up    DVT prophylaxis: Lovenox Code Status: Full code. Family Communication: No family at bed side. Disposition Plan:   Status is: Inpatient  Remains inpatient appropriate because:Unsafe d/c plan  Dispo:  Patient From: Homeless  Planned Disposition: [place]  Medically stable for discharge: Yes    Consultants:  Psychiatry  Procedures: None  Antimicrobials:  Anti-infectives (From admission, onward)    Start     Dose/Rate Route Frequency Ordered Stop   11/01/20 1000  ceFEPIme (MAXIPIME) 2 g in sodium chloride 0.9 % 100 mL IVPB        2 g 200 mL/hr over 30 Minutes Intravenous Every 12 hours 10/31/20 1550 11/03/20 2309   11/01/20 0600  ceFEPIme (MAXIPIME) 2 g in sodium chloride 0.9 % 100 mL IVPB  Status:  Discontinued        2 g 200 mL/hr over 30 Minutes Intravenous Every 8 hours 10/31/20 1541 10/31/20 1550   10/31/20 1000  ertapenem (INVANZ) 1,000 mg in sodium chloride 0.9 % 100 mL IVPB  Status:  Discontinued        1 g 200 mL/hr over 30 Minutes Intravenous Every 24 hours 10/31/20 0948 10/31/20 1541        Subjective: Patient is seen and examined at bedside.  No overnight events. She denies any chest pain or shortness of breath.  She is awaiting placement in a SNF.  Objective: Vitals:   11/22/20 1645 11/22/20 2047 11/23/20 0557 11/23/20 0925  BP: 114/66 107/69 112/77 114/62  Pulse:  91 78 78  Resp: '18 18 19 18  '$ Temp: 98 F (36.7 C) 98.8 F (37.1 C) 98.2 F (36.8  C) 98.1 F (36.7 C)  TempSrc: Oral Oral  Oral  SpO2: 96% 92% 96% 98%  Weight:      Height:        Intake/Output Summary (Last 24 hours) at 11/23/2020 1420 Last data filed at 11/23/2020 0902 Gross per 24 hour  Intake 483 ml  Output 700 ml  Net -217 ml   Filed Weights   11/19/20 0526 11/19/20 1000 11/21/20 0500  Weight: (!) 191.4 kg (!) 187.3 kg (!) 187.8 kg    Examination:  General exam: Appears chronically ill looking and deconditioned.  Lying in  the bed comfortably. Respiratory system: Bilaterally decreased breath sounds in bases,  respiratory effort normal. Cardiovascular system: S1-S2 heard, regular rate and rhythm, no murmur. Gastrointestinal system: Abdomen is soft, nontender, nondistended, normal bowel sounds heard.   Central nervous system: Alert and oriented x3, no neurological deficits. Extremities: Bilateral lower extremity edema. Skin: No rashes, lesions or ulcers Psychiatry: Mood and affect appropriate.    Data Reviewed: I have personally reviewed following labs and imaging studies  CBC: Recent Labs  Lab 11/18/20 0649 11/23/20 0302  WBC 10.6* 12.3*  NEUTROABS 6.9  --   HGB 10.6* 10.2*  HCT 35.7* 34.4*  MCV 87.1 85.6  PLT 482* 123XX123*   Basic Metabolic Panel: Recent Labs  Lab 11/18/20 0649 11/19/20 0501 11/21/20 0906 11/22/20 0305  NA 138 139  --  137  K 3.9 3.6  --  3.5  CL 102 98  --  101  CO2 27 29  --  26  GLUCOSE 88 88  --  84  BUN 20 19  --  21*  CREATININE 1.40* 1.24* 1.40* 1.30*  CALCIUM 9.3 9.7  --  9.4   GFR: Estimated Creatinine Clearance: 94.9 mL/min (A) (by C-G formula based on SCr of 1.3 mg/dL (H)). Liver Function Tests: No results for input(s): AST, ALT, ALKPHOS, BILITOT, PROT, ALBUMIN in the last 168 hours. No results for input(s): LIPASE, AMYLASE in the last 168 hours. No results for input(s): AMMONIA in the last 168 hours. Coagulation Profile: No results for input(s): INR, PROTIME in the last 168 hours. Cardiac Enzymes: No results for input(s): CKTOTAL, CKMB, CKMBINDEX, TROPONINI in the last 168 hours. BNP (last 3 results) No results for input(s): PROBNP in the last 8760 hours. HbA1C: No results for input(s): HGBA1C in the last 72 hours. CBG: No results for input(s): GLUCAP in the last 168 hours. Lipid Profile: No results for input(s): CHOL, HDL, LDLCALC, TRIG, CHOLHDL, LDLDIRECT in the last 72 hours. Thyroid Function Tests: No results for input(s): TSH, T4TOTAL, FREET4,  T3FREE, THYROIDAB in the last 72 hours. Anemia Panel: No results for input(s): VITAMINB12, FOLATE, FERRITIN, TIBC, IRON, RETICCTPCT in the last 72 hours. Sepsis Labs: No results for input(s): PROCALCITON, LATICACIDVEN in the last 168 hours.  No results found for this or any previous visit (from the past 240 hour(s)).    Radiology Studies: No results found.   Scheduled Meds:  ascorbic acid  500 mg Oral Daily   buPROPion ER  200 mg Oral BID   clotrimazole   Topical BID   enoxaparin (LOVENOX) injection  100 mg Subcutaneous Q24H   fluticasone  2 spray Each Nare BID   furosemide  40 mg Oral Daily   gabapentin  200 mg Oral TID   montelukast  10 mg Oral QHS   nystatin   Topical BID   pantoprazole  40 mg Oral Daily   rOPINIRole  4 mg Oral QHS  senna-docusate  2 tablet Oral Daily   sodium chloride flush  3 mL Intravenous Q12H   traZODone  50 mg Oral QHS   Continuous Infusions:  sodium chloride       LOS: 22 days    Time spent: 25 mins    Avrohom Mckelvin, MD [facility]   If 7PM-7AM, please contact night-coverage PRO

## 2020-11-23 NOTE — TOC Progression Note (Addendum)
Transition of Care [facility]) - Progression Note    Patient Details  Name: Chaselynn Draft Milligan MRN: [id number] Date of Birth: 10-12-1975  Transition of Care [facility] CM/SW Contact  [name], LCSW Phone Number: 11/23/2020, 4:29 PM  Clinical Narrative:  CSW talked with Ebony Hail, admissions liaison with [facility] regarding patient on 8/17. CSW advised that patient's information will be sent for review, but she thinks patient will be denied admission due to lack of mobility.   Call made to [facility] in admissions at [place] on 8/17 and message left. Received a call on 8/17 from [place], admissions director at [facility] regarding patient. She was unable to locate patient's information in Epic and was advised that clinicals would be resent.    8/18: Checked facility responses and [facility] has declined due to patient being bariatric. [place] and [facility] declined due to not being able to meet patient's needs.  Efforts to locate placement for patient will continue.     Expected Discharge Plan: Skilled Nursing Facility Barriers to Discharge: [place] [facility]), Homeless with medical needs (Homeless)  Expected Discharge Plan and Services Expected Discharge Plan: [place] In-house Referral: Clinical Social Work   Post Acute Care Choice: [place] (Needs LTC bed) Living arrangements for the past 2 months: Homeless Expected Discharge Date: 11/02/20                                     Social Determinants of Health (SDOH) Interventions    Readmission Risk Interventions No flowsheet data found.

## 2020-11-24 DIAGNOSIS — N390 Urinary tract infection, site not specified: Secondary | ICD-10-CM | POA: Diagnosis not present

## 2020-11-24 NOTE — TOC Progression Note (Signed)
Transition of Care [facility]) - Progression Note    Patient Details  Name: Heather [name] MRN: [id number] Date of Birth: Jun 07, 1975  [facility] CM/SW Contact  Heather Salina Mila Homer, LCSW Phone Number: 11/24/2020, 6:54 PM  Clinical Narrative:  Met with patient and was provided with a letter she received from [facility]. Of Social Services date 11/15/20. The letter reports that her application for Medical Assistance dated 8/8 has been closed and "The reason for this action is: You already have Full Medicaid coverage, which was awarded to you after reviewing your previously submitted application". CSW explained to patient that it must be verified what type of Medicaid she has, as LTC Medicaid needed if a facility is located that will accept her. CSW will made contact with patient's Medicaid worker Earnest Bailey next week.      Expected Discharge Plan: Skilled Nursing Facility Barriers to Discharge: [place] [facility]), Homeless with medical needs (Homeless)  Expected Discharge Plan and Services Expected Discharge Plan: [place] In-house Referral: Clinical Social Work   Post Acute Care Choice: [place] (Needs LTC bed) Living arrangements for the past 2 months: Homeless Expected Discharge Date: 11/02/20                                     Social Determinants of Health (SDOH) Interventions    Readmission Risk Interventions No flowsheet data found.

## 2020-11-24 NOTE — Progress Notes (Signed)
PROGRESS NOTE    Heather [name]  [id number] DOB: 1975-10-14 DOA: 10/30/2020 PCP: Lucianne Lei, MD   Brief Narrative:  This 45 year old female with morbid obesity, obstructive sleep apnea, COPD, MDD, GAD, insomnia and RLS, diabetes mellitus type 2,renal mass,  and numerous small wounds under her pannus, upper thighs and between her legs presented with right flank and suprapubic pain along with nausea and vomiting.  She was admitted and treated for complicated UTI with antibiotics and has completed treatment.  Currently medically stable for discharge to SNF.  Assessment & Plan:   Principal Problem:   Complicated UTI (urinary tract infection) Active Problems:   Asthma   PALPITATIONS, OCCASIONAL   Panniculitis   OSA (obstructive sleep apnea)   Generalized anxiety disorder   Major depression, chronic   Thrombocytosis   Hypokalemia   CKD (chronic kidney disease), stage III ([place])  Complicated UTI: Patient has completed 3 days cefepime, urine culture colonized,  currently off antibiotics Continue to monitor off antibiotics.   Nephrolithiasis Outpatient follow-up with urology.   Suicidal ideation/anxiety and depression Psychiatry has cleared the patient for discharge.   Continue Wellbutrin, trazodone and gabapentin.   Continue as needed clonazepam. Outpatient follow-up with psychiatry   Chronically disease stage III B--creatinine stable.  Monitor intermittently. Patient states that she is not having that much urine output and retaining fluid and gaining weight.   She states that she used to be on indapamide in the past.   She was started on Lasix IV 40 mg daily on 11/17/2020.   Continue Lasix 40 mg daily , strict input and output.  Daily weights.  Monitor creatinine.   Generalized deconditioning Homelessness TOC following.  Will need SNF placement.   Panniculitis Significantly improved.   Asthma Stable, continue current inhalers.   Morbid obesity Outpatient  follow-up    DVT prophylaxis: Lovenox Code Status: Full code. Family Communication: No family at bed side. Disposition Plan:   Status is: Inpatient  Remains inpatient appropriate because:Unsafe d/c plan  Dispo:  Patient From: Homeless  Planned Disposition: [place]  Medically stable for discharge: Yes    Consultants:  Psychiatry  Procedures: None  Antimicrobials:  Anti-infectives (From admission, onward)    Start     Dose/Rate Route Frequency Ordered Stop   11/01/20 1000  ceFEPIme (MAXIPIME) 2 g in sodium chloride 0.9 % 100 mL IVPB        2 g 200 mL/hr over 30 Minutes Intravenous Every 12 hours 10/31/20 1550 11/03/20 2309   11/01/20 0600  ceFEPIme (MAXIPIME) 2 g in sodium chloride 0.9 % 100 mL IVPB  Status:  Discontinued        2 g 200 mL/hr over 30 Minutes Intravenous Every 8 hours 10/31/20 1541 10/31/20 1550   10/31/20 1000  ertapenem (INVANZ) 1,000 mg in sodium chloride 0.9 % 100 mL IVPB  Status:  Discontinued        1 g 200 mL/hr over 30 Minutes Intravenous Every 24 hours 10/31/20 0948 10/31/20 1541        Subjective: Patient was seen and examined at bedside.  Overnight events noted. She was short of breath after she went to the bathroom, she is ambulatory but with support She is awaiting placement in a SNF.  Objective: Vitals:   11/23/20 0925 11/23/20 2126 11/24/20 0551 11/24/20 1028  BP: 114/62 113/63 103/65 127/82  Pulse: 78 88 81 84  Resp: '18 18 17 18  '$ Temp: 98.1 F (36.7 C) 98.7 F (37.1 C) 98  F (36.7 C) 98.6 F (37 C)  TempSrc: Oral Oral Oral Oral  SpO2: 98% 96% 98% 95%  Weight:  (!) 187.7 kg    Height:        Intake/Output Summary (Last 24 hours) at 11/24/2020 1403 Last data filed at 11/24/2020 0900 Gross per 24 hour  Intake 580 ml  Output 600 ml  Net -20 ml    Filed Weights   11/19/20 1000 11/21/20 0500 11/23/20 2126  Weight: (!) 187.3 kg (!) 187.8 kg (!) 187.7 kg    Examination:  General exam: Appears chronically  ill looking, and deconditioned , sitting in the chair. Respiratory system: Bilaterally decreased breath sounds but clear,  respiratory effort normal. Cardiovascular system: S1-S2 heard, regular rate and rhythm, no murmur. Gastrointestinal system: Abdomen is soft, nontender, nondistended, normal bowel sounds heard.   Central nervous system: Alert and oriented x3, no neurological deficits. Extremities: Bilateral lower extremity edema. Skin: No rashes, lesions or ulcers Psychiatry: Mood and affect appropriate.    Data Reviewed: I have personally reviewed following labs and imaging studies  CBC: Recent Labs  Lab 11/18/20 0649 11/23/20 0302  WBC 10.6* 12.3*  NEUTROABS 6.9  --   HGB 10.6* 10.2*  HCT 35.7* 34.4*  MCV 87.1 85.6  PLT 482* 413*    Basic Metabolic Panel: Recent Labs  Lab 11/18/20 0649 11/19/20 0501 11/21/20 0906 11/22/20 0305  NA 138 139  --  137  K 3.9 3.6  --  3.5  CL 102 98  --  101  CO2 27 29  --  26  GLUCOSE 88 88  --  84  BUN 20 19  --  21*  CREATININE 1.40* 1.24* 1.40* 1.30*  CALCIUM 9.3 9.7  --  9.4    GFR: Estimated Creatinine Clearance: 94.8 mL/min (A) (by C-G formula based on SCr of 1.3 mg/dL (H)). Liver Function Tests: No results for input(s): AST, ALT, ALKPHOS, BILITOT, PROT, ALBUMIN in the last 168 hours. No results for input(s): LIPASE, AMYLASE in the last 168 hours. No results for input(s): AMMONIA in the last 168 hours. Coagulation Profile: No results for input(s): INR, PROTIME in the last 168 hours. Cardiac Enzymes: No results for input(s): CKTOTAL, CKMB, CKMBINDEX, TROPONINI in the last 168 hours. BNP (last 3 results) No results for input(s): PROBNP in the last 8760 hours. HbA1C: No results for input(s): HGBA1C in the last 72 hours. CBG: No results for input(s): GLUCAP in the last 168 hours. Lipid Profile: No results for input(s): CHOL, HDL, LDLCALC, TRIG, CHOLHDL, LDLDIRECT in the last 72 hours. Thyroid Function Tests: No results  for input(s): TSH, T4TOTAL, FREET4, T3FREE, THYROIDAB in the last 72 hours. Anemia Panel: No results for input(s): VITAMINB12, FOLATE, FERRITIN, TIBC, IRON, RETICCTPCT in the last 72 hours. Sepsis Labs: No results for input(s): PROCALCITON, LATICACIDVEN in the last 168 hours.  No results found for this or any previous visit (from the past 240 hour(s)).    Radiology Studies: No results found.   Scheduled Meds:  ascorbic acid  500 mg Oral Daily   buPROPion ER  200 mg Oral BID   clotrimazole   Topical BID   enoxaparin (LOVENOX) injection  100 mg Subcutaneous Q24H   fluticasone  2 spray Each Nare BID   furosemide  40 mg Oral Daily   gabapentin  200 mg Oral TID   montelukast  10 mg Oral QHS   nystatin   Topical BID   pantoprazole  40 mg Oral Daily   rOPINIRole  4 mg Oral QHS   senna-docusate  2 tablet Oral Daily   sodium chloride flush  3 mL Intravenous Q12H   traZODone  50 mg Oral QHS   Continuous Infusions:  sodium chloride       LOS: 23 days    Time spent: 25 mins    Audrick Lamoureaux, MD [facility]   If 7PM-7AM, please contact night-coverage PRO

## 2020-11-24 NOTE — Progress Notes (Signed)
Occupational Therapy Treatment Patient Details Name: Heather [name] MRN: [id number] DOB: 19-Sep-1975 Today's Date: 11/24/2020    History of present illness 45 y.o. female presented to ED for chest pain and palpitations and feeling like her throat was closing while at her behavioral health facility for SI. Admitted 10/31/20 for treatment of complicated UTI PMH: anxiety and depression (hx of bipolar and schizoaffective disorder in chart in past), ACD, panniculitis, debility,GERD,  OSA not on cpap, chronic back pain.   OT comments  Patient making great progress toward goals. No c/o pain in R knee this date. Patient able to complete front perineal hygiene in supine after purwick failure with assist secondary to body habitus. Patient then doffed soiled gown and donned new gown with set-up assist. Given absent knee pain, patient able to stand from EOB to bariatric RW with Min guard to Min A and take steps to recliner on L. Session concluded with patient seated in recliner with call bell within reach and all needs met. Would benefit from continued acute OT services in prep for safe d/c to next level of care.    Follow Up Recommendations  SNF;Supervision/Assistance - 24 hour    Equipment Recommendations  Other (comment) (Defer to next level of care.)    Recommendations for Other Services      Precautions / Restrictions Precautions Precautions: Fall Precaution Comments: fall prior to hospitalization Restrictions Weight Bearing Restrictions: No       Mobility Bed Mobility Overal bed mobility: Needs Assistance Bed Mobility: Supine to Sit     Supine to sit: HOB elevated;Min guard     General bed mobility comments: Able to advance BLE from bed surface to EOB and elevate trunk with use of bed rails and increased time. Min gaurd for safety.    Transfers Overall transfer level: Needs assistance Equipment used: Rolling walker (2 wheeled) Transfers: Sit to/from Stand Sit to Stand: From  elevated surface;Min assist Stand pivot transfers: Min assist       General transfer comment: No RLE pain this date. Able to stand from sizewise bed with Min guard to Min A. Good recall for hand palcement on RW. Able to take steps to chair on L with Min guard to Min A.    Balance Overall balance assessment: Needs assistance Sitting-balance support: Feet supported;No upper extremity supported Sitting balance-Leahy Scale: Good Sitting balance - Comments: Able to maintain static sitting balance at EOB for UB dressing and grooming without external assist or LOB.   Standing balance support: Bilateral upper extremity supported;During functional activity;No upper extremity supported;Single extremity supported Standing balance-Leahy Scale: Poor                             ADL either performed or assessed with clinical judgement   ADL Overall ADL's : Needs assistance/impaired     Grooming: Set up;Sitting Grooming Details (indicate cue type and reason): 2/3 grooming tasks seated EOB with set-up assist.     Lower Body Bathing: Moderate assistance;Bed level Lower Body Bathing Details (indicate cue type and reason): Mod A without AD. Would benefit from long-handled sponge Upper Body Dressing : Set up;Sitting   Lower Body Dressing: Moderate assistance;Bed level;With adaptive equipment Lower Body Dressing Details (indicate cue type and reason): Min A to don footwear in supine with use of reacher. Toilet Transfer: Minimal Insurance claims handler Details (indicate cue type and reason): Simulated; Min gaurd to Min A for stand-pivot to recliner with use of  RW.           General ADL Comments: Good recall of use of AE for completion of ADLs.     Vision       Perception     Praxis      Cognition Arousal/Alertness: Awake/alert Behavior During Therapy: WFL for tasks assessed/performed Overall Cognitive Status: Within Functional Limits for tasks assessed                                           Exercises     Shoulder Instructions       General Comments      Pertinent Vitals/ Pain       Pain Assessment: No/denies pain  Home Living                                          Prior Functioning/Environment              Frequency  Min 2X/week        Progress Toward Goals  OT Goals(current goals can now be found in the care plan section)  Progress towards OT goals: Progressing toward goals  Acute Rehab OT Goals Patient Stated Goal: be able to get around on her own OT Goal Formulation: With patient Time For Goal Achievement: 12/05/20 Potential to Achieve Goals: Fair ADL Goals Pt Will Perform Grooming: with set-up;sitting Pt Will Perform Lower Body Bathing: with supervision;sit to/from stand;sitting/lateral leans;with adaptive equipment Pt Will Perform Lower Body Dressing: with supervision;sit to/from stand;sitting/lateral leans;with adaptive equipment Pt Will Transfer to Toilet: with supervision;ambulating;bedside commode Pt Will Perform Toileting - Clothing Manipulation and hygiene: with supervision;sitting/lateral leans;sit to/from stand;with adaptive equipment Pt Will Perform Tub/Shower Transfer: with supervision;tub bench;rolling walker  Plan Discharge plan remains appropriate;Frequency remains appropriate    Co-evaluation                 AM-PAC OT "6 Clicks" Daily Activity     Outcome Measure   Help from another person eating meals?: None Help from another person taking care of personal grooming?: A Little Help from another person toileting, which includes using toliet, bedpan, or urinal?: A Lot Help from another person bathing (including washing, rinsing, drying)?: A Lot Help from another person to put on and taking off regular upper body clothing?: A Little Help from another person to put on and taking off regular lower body clothing?: A Lot 6 Click Score: 16    End of Session  Equipment Utilized During Treatment: Gait belt;Rolling walker  OT Visit Diagnosis: Unsteadiness on feet (R26.81);Other abnormalities of gait and mobility (R26.89);Muscle weakness (generalized) (M62.81);Pain;Other (comment)   Activity Tolerance Patient tolerated treatment well   Patient Left in chair;with call bell/phone within reach   Nurse Communication Mobility status        Time: 4481-8563 OT Time Calculation (min): 31 min  Charges: OT General Charges $OT Visit: 1 Visit OT Treatments $Self Care/Home Management : 23-37 mins  Arelyn Gauer H. OTR/L Supplemental OT, Department of rehab services [phone]   Noorah Giammona R H. 11/24/2020, 9:37 AM

## 2020-11-24 NOTE — Progress Notes (Signed)
Physical Therapy Treatment Patient Details Name: Heather [name] MRN: [id number] DOB: 08-15-1975 Today's Date: 11/24/2020    History of Present Illness 45 y.o. female presented to ED for chest pain and palpitations and feeling like her throat was closing while at her behavioral health facility for SI. Admitted 10/31/20 for treatment of complicated UTI PMH: anxiety and depression (hx of bipolar and schizoaffective disorder in chart in past), ACD, panniculitis, debility,GERD,  OSA not on cpap, chronic back pain.    PT Comments    Pt reluctant to participate with PT today as she states she has been up in the chair since 8:30 in the morning. Pt agreeable to participate in exercise sitting EoB. Pt able to perform bed mobility with min guard. Encouraged pt to sit up in chair over the weekend. D/c plans remain appropriate at this time. PT will continue to follow acutely.    Follow Up Recommendations  SNF     Equipment Recommendations  None recommended by PT (has RW, left BSC at hotel)    Recommendations for Other Services OT consult     Precautions / Restrictions Precautions Precautions: Fall Precaution Comments: fall prior to hospitalization Restrictions Weight Bearing Restrictions: No    Mobility  Bed Mobility Overal bed mobility: Needs Assistance Bed Mobility: Supine to Sit     Supine to sit: HOB elevated;Min guard Sit to supine: Min guard   General bed mobility comments: min guard for safety, with deflated bed pt able to come to EoB and return to supine.    Transfers                 General transfer comment: pt declines as she has stayed up in chair since 8:30 this morning     Balance Overall balance assessment: Needs assistance Sitting-balance support: Feet supported;No upper extremity supported Sitting balance-Leahy Scale: Fair     Standing balance support: Bilateral upper extremity supported;During functional activity;No upper extremity supported;Single  extremity supported Standing balance-Leahy Scale: Poor                              Cognition Arousal/Alertness: Awake/alert Behavior During Therapy: WFL for tasks assessed/performed Overall Cognitive Status: Impaired/Different from baseline                                        Exercises General Exercises - Lower Extremity Ankle Circles/Pumps: AROM;Both;20 reps Quad Sets: AROM;Both;10 reps;Supine Gluteal Sets: AROM;Both;10 reps;Supine Long Arc Quad: AROM;Both;10 reps;Seated Hip ABduction/ADduction: AROM;Both;10 reps;Seated Hip Flexion/Marching: AROM;Both;10 reps;Seated    General Comments General comments (skin integrity, edema, etc.): VSS on RA      Pertinent Vitals/Pain Pain Assessment: Faces Faces Pain Scale: Hurts a little bit Pain Location: r leg Pain Descriptors / Indicators: Grimacing;Guarding Pain Intervention(s): Limited activity within patient's tolerance;Monitored during session;Premedicated before session;Repositioned     PT Goals (current goals can now be found in the care plan section) Acute Rehab PT Goals Patient Stated Goal: be able to get around on her own PT Goal Formulation: With patient Time For Goal Achievement: 11/16/20 Potential to Achieve Goals: Fair Progress towards PT goals: Progressing toward goals    Frequency    Min 2X/week      PT Plan Current plan remains appropriate       AM-PAC PT "6 Clicks" Mobility   Outcome Measure  Help needed turning  from your back to your side while in a flat bed without using bedrails?: None Help needed moving from lying on your back to sitting on the side of a flat bed without using bedrails?: A Lot Help needed moving to and from a bed to a chair (including a wheelchair)?: Total Help needed standing up from a chair using your arms (e.g., wheelchair or bedside chair)?: Total Help needed to walk in hospital room?: Total Help needed climbing 3-5 steps with a railing? :  Total 6 Click Score: 10    End of Session   Activity Tolerance: Patient tolerated treatment well Patient left: with call bell/phone within reach;in bed Nurse Communication: Mobility status PT Visit Diagnosis: Unsteadiness on feet (R26.81);Other abnormalities of gait and mobility (R26.89);History of falling (Z91.81);Muscle weakness (generalized) (M62.81);Difficulty in walking, not elsewhere classified (R26.2);Pain Pain - Right/Left: Right Pain - part of body: Knee (back)     Time: [id number] PT Time Calculation (min) (ACUTE ONLY): 14 min  Charges:  $Therapeutic Exercise: 8-22 mins                     Beaulah Romanek B. Migdalia Dk PT, DPT Acute Rehabilitation Services Pager [phone] Office [phone]    [place] 11/24/2020, 2:36 PM

## 2020-11-25 DIAGNOSIS — N390 Urinary tract infection, site not specified: Secondary | ICD-10-CM | POA: Diagnosis not present

## 2020-11-25 NOTE — Progress Notes (Signed)
PROGRESS NOTE    Heather [name]  [id number] DOB: 10-20-1975 DOA: 10/30/2020 PCP: Lucianne Lei, MD   Brief Narrative:  This 45 year old female with morbid obesity, obstructive sleep apnea, COPD, MDD, GAD, insomnia and RLS, diabetes mellitus type 2,renal mass,  and numerous small wounds under her pannus, upper thighs and between her legs presented with right flank and suprapubic pain along with nausea and vomiting.  She was admitted and treated for complicated UTI with antibiotics and has completed treatment.  Currently medically stable for discharge to SNF.  Assessment & Plan:   Principal Problem:   Complicated UTI (urinary tract infection) Active Problems:   Asthma   PALPITATIONS, OCCASIONAL   Panniculitis   OSA (obstructive sleep apnea)   Generalized anxiety disorder   Major depression, chronic   Thrombocytosis   Hypokalemia   CKD (chronic kidney disease), stage III ([place])  Complicated UTI: Patient has completed 3 days cefepime, urine culture colonized, currently off antibiotics. Continue to monitor off antibiotics.   Nephrolithiasis Outpatient follow-up with urology.   Suicidal ideation/anxiety and depression Psychiatry has cleared the patient for discharge.   Continue Wellbutrin, trazodone and gabapentin.   Continue as needed clonazepam. Outpatient follow-up with psychiatry   Chronically disease stage III B--creatinine stable.  Monitor intermittently. Patient states that she is not having that much urine output and retaining fluid and gaining weight.   She states that she used to be on indapamide in the past.   She was started on Lasix IV 40 mg daily on 11/17/2020.   Continue Lasix 40 mg daily , strict input and output.  Daily weights.  Monitor creatinine.   Generalized deconditioning Homelessness TOC following.  Will need SNF placement.   Panniculitis Significantly improved.   Asthma Stable, continue current inhalers.   Morbid obesity Outpatient  follow-up    DVT prophylaxis: Lovenox Code Status: Full code. Family Communication: No family at bed side. Disposition Plan:   Status is: Inpatient  Remains inpatient appropriate because:Unsafe d/c plan  Dispo:  Patient From: Homeless  Planned Disposition: [place]  Medically stable for discharge: Yes    Consultants:  Psychiatry  Procedures: None  Antimicrobials:  Anti-infectives (From admission, onward)    Start     Dose/Rate Route Frequency Ordered Stop   11/01/20 1000  ceFEPIme (MAXIPIME) 2 g in sodium chloride 0.9 % 100 mL IVPB        2 g 200 mL/hr over 30 Minutes Intravenous Every 12 hours 10/31/20 1550 11/03/20 2309   11/01/20 0600  ceFEPIme (MAXIPIME) 2 g in sodium chloride 0.9 % 100 mL IVPB  Status:  Discontinued        2 g 200 mL/hr over 30 Minutes Intravenous Every 8 hours 10/31/20 1541 10/31/20 1550   10/31/20 1000  ertapenem (INVANZ) 1,000 mg in sodium chloride 0.9 % 100 mL IVPB  Status:  Discontinued        1 g 200 mL/hr over 30 Minutes Intravenous Every 24 hours 10/31/20 0948 10/31/20 1541        Subjective: Patient was seen and examined at bedside.  Overnight events noted. She is lying down comfortably in the bed , she is interested in gastric bypass surgery.   She is awaiting placement in a SNF.  Objective: Vitals:   11/24/20 1753 11/24/20 2008 11/25/20 0500 11/25/20 0955  BP: 115/62 (!) 120/57 (!) 95/51 99/61  Pulse: 90 93 66 82  Resp: '18 20 20 18  '$ Temp: 98.8 F (37.1 C) 98.1 F (36.7  C) 98 F (36.7 C) 98.2 F (36.8 C)  TempSrc: Oral Oral Oral Oral  SpO2: 94% 95% 100% 92%  Weight:      Height:        Intake/Output Summary (Last 24 hours) at 11/25/2020 1254 Last data filed at 11/25/2020 1105 Gross per 24 hour  Intake 777 ml  Output 1300 ml  Net -523 ml   Filed Weights   11/19/20 1000 11/21/20 0500 11/23/20 2126  Weight: (!) 187.3 kg (!) 187.8 kg (!) 187.7 kg    Examination:  General exam: Appears chronically ill  looking, and deconditioned , not in distress. Respiratory system: Bilaterally decreased breath sounds but clear,  respiratory effort normal. Cardiovascular system: S1-S2 heard, regular rate and rhythm, no murmur. Gastrointestinal system: Abdomen is soft, nontender, nondistended, normal bowel sounds heard.   Central nervous system: Alert and oriented x 3, no neurological deficits. Extremities: Bilateral lower extremity edema. Skin: No rashes, lesions or ulcers Psychiatry: Mood and affect appropriate.    Data Reviewed: I have personally reviewed following labs and imaging studies  CBC: Recent Labs  Lab 11/23/20 0302  WBC 12.3*  HGB 10.2*  HCT 34.4*  MCV 85.6  PLT [id number]*   Basic Metabolic Panel: Recent Labs  Lab 11/19/20 0501 11/21/20 0906 11/22/20 0305  NA 139  --  137  K 3.6  --  3.5  CL 98  --  101  CO2 29  --  26  GLUCOSE 88  --  84  BUN 19  --  21*  CREATININE 1.24* 1.40* 1.30*  CALCIUM 9.7  --  9.4   GFR: Estimated Creatinine Clearance: 94.8 mL/min (A) (by C-G formula based on SCr of 1.3 mg/dL (H)). Liver Function Tests: No results for input(s): AST, ALT, ALKPHOS, BILITOT, PROT, ALBUMIN in the last 168 hours. No results for input(s): LIPASE, AMYLASE in the last 168 hours. No results for input(s): AMMONIA in the last 168 hours. Coagulation Profile: No results for input(s): INR, PROTIME in the last 168 hours. Cardiac Enzymes: No results for input(s): CKTOTAL, CKMB, CKMBINDEX, TROPONINI in the last 168 hours. BNP (last 3 results) No results for input(s): PROBNP in the last 8760 hours. HbA1C: No results for input(s): HGBA1C in the last 72 hours. CBG: No results for input(s): GLUCAP in the last 168 hours. Lipid Profile: No results for input(s): CHOL, HDL, LDLCALC, TRIG, CHOLHDL, LDLDIRECT in the last 72 hours. Thyroid Function Tests: No results for input(s): TSH, T4TOTAL, FREET4, T3FREE, THYROIDAB in the last 72 hours. Anemia Panel: No results for input(s):  VITAMINB12, FOLATE, FERRITIN, TIBC, IRON, RETICCTPCT in the last 72 hours. Sepsis Labs: No results for input(s): PROCALCITON, LATICACIDVEN in the last 168 hours.  No results found for this or any previous visit (from the past 240 hour(s)).    Radiology Studies: No results found.   Scheduled Meds:  ascorbic acid  500 mg Oral Daily   buPROPion ER  200 mg Oral BID   clotrimazole   Topical BID   enoxaparin (LOVENOX) injection  100 mg Subcutaneous Q24H   fluticasone  2 spray Each Nare BID   furosemide  40 mg Oral Daily   gabapentin  200 mg Oral TID   montelukast  10 mg Oral QHS   nystatin   Topical BID   pantoprazole  40 mg Oral Daily   rOPINIRole  4 mg Oral QHS   senna-docusate  2 tablet Oral Daily   sodium chloride flush  3 mL Intravenous Q12H   traZODone  50 mg Oral QHS   Continuous Infusions:  sodium chloride       LOS: 24 days    Time spent: 25 mins    Delesa Kawa, MD [facility]   If 7PM-7AM, please contact night-coverage PRO

## 2020-11-25 NOTE — Plan of Care (Signed)
  Problem: Health Behavior/Discharge Planning: Goal: Ability to manage health-related needs will improve Outcome: Progressing   Problem: Activity: Goal: Risk for activity intolerance will decrease Outcome: Progressing   Problem: Skin Integrity: Goal: Risk for impaired skin integrity will decrease Outcome: Progressing   Problem: Health Behavior/Discharge (Transition) Planning: Goal: Ability to manage health-related needs will improve Outcome: Progressing

## 2020-11-26 DIAGNOSIS — N390 Urinary tract infection, site not specified: Secondary | ICD-10-CM | POA: Diagnosis not present

## 2020-11-26 LAB — BASIC METABOLIC PANEL
Anion gap: 9 (ref 5–15)
BUN: 17 mg/dL (ref 6–20)
CO2: 25 mmol/L (ref 22–32)
Calcium: 8.9 mg/dL (ref 8.9–10.3)
Chloride: 101 mmol/L (ref 98–111)
Creatinine, Ser: 1.38 mg/dL — ABNORMAL HIGH (ref 0.44–1.00)
GFR, Estimated: 49 mL/min — ABNORMAL LOW (ref >60)
Glucose, Bld: 93 mg/dL (ref 70–99)
Potassium: 3.5 mmol/L (ref 3.5–5.1)
Sodium: 135 mmol/L (ref 135–145)

## 2020-11-26 NOTE — Progress Notes (Signed)
PROGRESS NOTE    Heather [name]  [id number] DOB: [birth year] DOA: 10/30/2020 PCP: Lucianne Lei, MD   Brief Narrative:  This 45 year old female with morbid obesity, obstructive sleep apnea, COPD, MDD, GAD, insomnia and RLS, diabetes mellitus type 2,renal mass,  and numerous small wounds under her pannus, upper thighs and between her legs presented with right flank and suprapubic pain along with nausea and vomiting.  She was admitted and treated for complicated UTI with antibiotics and has completed treatment.  Currently medically stable for discharge to SNF.  Assessment & Plan:   Principal Problem:   Complicated UTI (urinary tract infection) Active Problems:   Asthma   PALPITATIONS, OCCASIONAL   Panniculitis   OSA (obstructive sleep apnea)   Generalized anxiety disorder   Major depression, chronic   Thrombocytosis   Hypokalemia   CKD (chronic kidney disease), stage III ([place])  Complicated UTI: Patient has completed 3 days cefepime, urine culture colonized, currently off antibiotics. Continue to monitor off antibiotics.   Nephrolithiasis Outpatient follow-up with urology.   Suicidal ideation/anxiety and depression Psychiatry has signed off, cleared the patient for discharge.   Continue Wellbutrin, trazodone and gabapentin.   Continue as needed clonazepam. Outpatient follow-up with psychiatry   Chronically disease stage III B--creatinine stable.  Monitor intermittently. She states that she used to be on indapamide in the past.   She was started on Lasix IV 40 mg daily on 11/17/2020.   Continue Lasix 40 mg daily , strict input and output.  Daily weights.  Monitor creatinine.   Generalized deconditioning Homelessness. TOC following.  Will need SNF placement.   Panniculitis Significantly improved.   Asthma Stable, continue current inhalers.   Morbid obesity Outpatient follow-up    DVT prophylaxis: Lovenox Code Status: Full code. Family Communication: No family  at bed side. Disposition Plan:   Status is: Inpatient  Remains inpatient appropriate because:Unsafe d/c plan  Dispo:  Patient From: Homeless  Planned Disposition: [place]  Medically stable for discharge: Yes    Consultants:  Psychiatry  Procedures: None  Antimicrobials:  Anti-infectives (From admission, onward)    Start     Dose/Rate Route Frequency Ordered Stop   11/01/20 1000  ceFEPIme (MAXIPIME) 2 g in sodium chloride 0.9 % 100 mL IVPB        2 g 200 mL/hr over 30 Minutes Intravenous Every 12 hours 10/31/20 1550 11/03/20 2309   11/01/20 0600  ceFEPIme (MAXIPIME) 2 g in sodium chloride 0.9 % 100 mL IVPB  Status:  Discontinued        2 g 200 mL/hr over 30 Minutes Intravenous Every 8 hours 10/31/20 1541 10/31/20 1550   10/31/20 1000  ertapenem (INVANZ) 1,000 mg in sodium chloride 0.9 % 100 mL IVPB  Status:  Discontinued        1 g 200 mL/hr over 30 Minutes Intravenous Every 24 hours 10/31/20 0948 10/31/20 1541        Subjective: Patient was seen and examined at bedside.  Overnight events noted. Patient is lying comfortably in the bed.  She is interested in gastric bypass surgery. She denies any other concerns. She is awaiting placement in a SNF.  Objective: Vitals:   11/25/20 2038 11/26/20 0500 11/26/20 0532 11/26/20 1014  BP: 118/70  120/78 (!) 114/58  Pulse: 83  91 79  Resp: '18  16 18  '$ Temp: 98 F (36.7 C)  98.2 F (36.8 C) 98.2 F (36.8 C)  TempSrc: Oral  Oral Oral  SpO2: 98%  96% 92%  Weight:  (!) 196 kg    Height:        Intake/Output Summary (Last 24 hours) at 11/26/2020 1108 Last data filed at 11/26/2020 0200 Gross per 24 hour  Intake 120 ml  Output 825 ml  Net -705 ml    Filed Weights   11/21/20 0500 11/23/20 2126 11/26/20 0500  Weight: (!) 187.8 kg (!) 187.7 kg (!) 196 kg    Examination:  General exam: Appears chronically ill looking, and deconditioned, not in any acute distress. Respiratory system: Bilaterally decreased  breath sounds but clear,  respiratory effort normal. Cardiovascular system: S1-S2 heard, regular rate and rhythm, no murmur. Gastrointestinal system: Abdomen is soft, nontender, nondistended, normal bowel sounds heard.   Central nervous system: Alert and oriented x 3, no neurological deficits. Extremities: Bilateral lower extremity edema. Skin: No rashes, lesions or ulcers Psychiatry: Mood and affect appropriate.    Data Reviewed: I have personally reviewed following labs and imaging studies  CBC: Recent Labs  Lab 11/23/20 0302  WBC 12.3*  HGB 10.2*  HCT 34.4*  MCV 85.6  PLT 413*    Basic Metabolic Panel: Recent Labs  Lab 11/21/20 0906 11/22/20 0305 11/26/20 0137  NA  --  137 135  K  --  3.5 3.5  CL  --  101 101  CO2  --  26 25  GLUCOSE  --  84 93  BUN  --  21* 17  CREATININE 1.40* 1.30* 1.38*  CALCIUM  --  9.4 8.9    GFR: Estimated Creatinine Clearance: 92.1 mL/min (A) (by C-G formula based on SCr of 1.38 mg/dL (H)). Liver Function Tests: No results for input(s): AST, ALT, ALKPHOS, BILITOT, PROT, ALBUMIN in the last 168 hours. No results for input(s): LIPASE, AMYLASE in the last 168 hours. No results for input(s): AMMONIA in the last 168 hours. Coagulation Profile: No results for input(s): INR, PROTIME in the last 168 hours. Cardiac Enzymes: No results for input(s): CKTOTAL, CKMB, CKMBINDEX, TROPONINI in the last 168 hours. BNP (last 3 results) No results for input(s): PROBNP in the last 8760 hours. HbA1C: No results for input(s): HGBA1C in the last 72 hours. CBG: No results for input(s): GLUCAP in the last 168 hours. Lipid Profile: No results for input(s): CHOL, HDL, LDLCALC, TRIG, CHOLHDL, LDLDIRECT in the last 72 hours. Thyroid Function Tests: No results for input(s): TSH, T4TOTAL, FREET4, T3FREE, THYROIDAB in the last 72 hours. Anemia Panel: No results for input(s): VITAMINB12, FOLATE, FERRITIN, TIBC, IRON, RETICCTPCT in the last 72 hours. Sepsis  Labs: No results for input(s): PROCALCITON, LATICACIDVEN in the last 168 hours.  No results found for this or any previous visit (from the past 240 hour(s)).    Radiology Studies: No results found.   Scheduled Meds:  ascorbic acid  500 mg Oral Daily   buPROPion ER  200 mg Oral BID   clotrimazole   Topical BID   enoxaparin (LOVENOX) injection  100 mg Subcutaneous Q24H   fluticasone  2 spray Each Nare BID   furosemide  40 mg Oral Daily   gabapentin  200 mg Oral TID   montelukast  10 mg Oral QHS   nystatin   Topical BID   pantoprazole  40 mg Oral Daily   rOPINIRole  4 mg Oral QHS   senna-docusate  2 tablet Oral Daily   sodium chloride flush  3 mL Intravenous Q12H   traZODone  50 mg Oral QHS   Continuous Infusions:  sodium chloride  LOS: 25 days    Time spent: 25 mins    Shawna Clamp, MD [facility]   If 7PM-7AM, please contact night-coverage PRO

## 2020-11-27 DIAGNOSIS — N390 Urinary tract infection, site not specified: Secondary | ICD-10-CM | POA: Diagnosis not present

## 2020-11-27 NOTE — Progress Notes (Addendum)
Physical Therapy Treatment Patient Details Name: Heather [name] MRN: [id number] DOB: 05/20/1975 Today's Date: 11/27/2020    History of Present Illness 45 y.o. female presented to ED for chest pain and palpitations and feeling like her throat was closing while at her behavioral health facility for SI. Admitted 10/31/20 for treatment of complicated UTI PMH: anxiety and depression (hx of bipolar and schizoaffective disorder in chart in past), ACD, panniculitis, debility,GERD,  OSA not on cpap, chronic back pain.    PT Comments    Pt was seen for transition to [facility] and back x 2 with steps to the side, but declined to try them alongside the bed.  Talked with her about sitting OOB and despite assuring her she could have a time limit, she declined that as well.  Pt admits lying is uncomfortable on the bed, and could be potentially convinced to sit up more with agreement of time to get back to bed.  Pt has a hoyer lift in the chair, but is able to stand up from the [facility] with minor assist.   Recommend to encourage her to increase activity with clear expectations and limits so she will hopefully agree to more time OOB, shortening her need to stay in rehab, particularly since she does not have a rehab bed yet.  Frequency increased to get more time encouraging gait.  Follow Up Recommendations  SNF     Equipment Recommendations  None recommended by PT    Recommendations for Other Services       Precautions / Restrictions Precautions Precautions: Fall Precaution Comments: fall prior to hospitalization Restrictions Weight Bearing Restrictions: No    Mobility  Bed Mobility Overal bed mobility: Needs Assistance Bed Mobility: Supine to Sit;Sit to Supine     Supine to sit: HOB elevated;Min guard Sit to supine: Mod assist   General bed mobility comments: mod assist to get legs onto bed    Transfers Overall transfer level: Needs assistance Equipment used: Rolling walker (2  wheeled) Transfers: Sit to/from Stand Sit to Stand: Min assist;From elevated surface Stand pivot transfers: Min assist       General transfer comment: pt declined to sit up in chair but did sit up on her BSC for a time  Ambulation/Gait Ambulation/Gait assistance: +2 safety/equipment;Min assist Gait Distance (Feet): 12 Feet (4 x 3) Assistive device: Rolling walker (2 wheeled) Gait Pattern/deviations: Decreased stride length;Step-to pattern;Wide base of support;Trunk flexed Gait velocity: reduced Gait velocity interpretation: <1.31 ft/sec, indicative of household ambulator General Gait Details: min assist of one to [facility] and back x 2   Stairs             Wheelchair Mobility    Modified Rankin (Stroke Patients Only)       Balance Overall balance assessment: Needs assistance Sitting-balance support: Feet supported Sitting balance-Leahy Scale: Fair Sitting balance - Comments: sits on side of bed with mattress deflated and feet on floor   Standing balance support: Bilateral upper extremity supported;During functional activity;No upper extremity supported;Single extremity supported Standing balance-Leahy Scale: Poor                              Cognition Arousal/Alertness: Awake/alert Behavior During Therapy: WFL for tasks assessed/performed Overall Cognitive Status: Impaired/Different from baseline Area of Impairment: Safety/judgement;Problem solving                         Safety/Judgement: Decreased awareness of deficits;Decreased  awareness of safety   Problem Solving: Requires verbal cues;Requires tactile cues General Comments: encouraging her to sit in chair and declines, but is also still talking about getting home to two children alone      Exercises General Exercises - Lower Extremity Ankle Circles/Pumps: AROM;Both;20 reps Long Arc Quad: AROM;Both;15 reps Heel Slides: AROM;Both;15 reps Hip ABduction/ADduction: AROM;Both;15 reps     General Comments General comments (skin integrity, edema, etc.): pt is using walker in an essential way, requires upper body support to get upright enough to move walker but not having to assist her to make the transition      Pertinent Vitals/Pain Pain Assessment: Faces Faces Pain Scale: Hurts a little bit Pain Location: back Pain Descriptors / Indicators: Guarding;Grimacing Pain Intervention(s): Monitored during session;Repositioned    Home Living                      Prior Function            PT Goals (current goals can now be found in the care plan section) Acute Rehab PT Goals Patient Stated Goal: be able to get around on her own    Frequency    Min 3X/week      PT Plan Frequency needs to be updated    Co-evaluation              AM-PAC PT "6 Clicks" Mobility   Outcome Measure  Help needed turning from your back to your side while in a flat bed without using bedrails?: None Help needed moving from lying on your back to sitting on the side of a flat bed without using bedrails?: A Little Help needed moving to and from a bed to a chair (including a wheelchair)?: A Little Help needed standing up from a chair using your arms (e.g., wheelchair or bedside chair)?: A Little Help needed to walk in hospital room?: A Lot Help needed climbing 3-5 steps with a railing? : Total 6 Click Score: 16    End of Session   Activity Tolerance: Patient tolerated treatment well Patient left: with call bell/phone within reach;in bed Nurse Communication: Mobility status PT Visit Diagnosis: Unsteadiness on feet (R26.81);Muscle weakness (generalized) (M62.81);Difficulty in walking, not elsewhere classified (R26.2);Pain;History of falling (Z91.81) Pain - Right/Left:  (central back) Pain - part of body:  (back)     Time: [id number] PT Time Calculation (min) (ACUTE ONLY): 39 min  Charges:  $Gait Training: 8-22 mins $Therapeutic Exercise: 8-22 mins $Therapeutic Activity:  8-22 mins            Ramond Dial 11/27/2020, 12:24 PM

## 2020-11-27 NOTE — TOC Progression Note (Addendum)
Transition of Care [facility]) - Progression Note    Patient Details  Name: Heather [name] MRN: [id number] Date of Birth: [birth year]  Transition [facility]) CM/SW Contact  [name], LCSW Phone Number: 11/27/2020, 12:56 PM  Clinical Narrative: Call made to [facility] and message left on [employer] (Adult [place] Worker) phone [phone] for her to call CSW regarding patient's Medicaid. CSW needing to verify what type Medicaid patient has as she needs long-term Medicaid to pay for long-term care in a nursing facility.     Expected Discharge Plan: Skilled Nursing Facility Barriers to Discharge: [place] [facility]), Homeless with medical needs (Homeless)  Expected Discharge Plan and Services Expected Discharge Plan: [place] In-house Referral: Clinical Social Work   Post Acute Care Choice: [place] (Needs LTC bed) Living arrangements for the past 2 months: Homeless Expected Discharge Date: 11/02/20                                   Social Determinants of Health (SDOH) Interventions  Patient is homeless.  Readmission Risk Interventions No flowsheet data found.

## 2020-11-27 NOTE — Plan of Care (Signed)
  Problem: Activity: Goal: Risk for activity intolerance will decrease Outcome: Progressing   

## 2020-11-27 NOTE — Progress Notes (Signed)
PROGRESS NOTE    Heather [name]  [id number] DOB: 01/08/1976 DOA: 10/30/2020 PCP: Lucianne Lei, MD   Brief Narrative:  This 45 year old female with morbid obesity, obstructive sleep apnea, COPD, MDD, GAD, insomnia and RLS, diabetes mellitus type 2,renal mass,  and numerous small wounds under her pannus, upper thighs and between her legs presented with right flank and suprapubic pain along with nausea and vomiting.  She was admitted and treated for complicated UTI with antibiotics and has completed treatment.  Currently medically stable for discharge to SNF.  Assessment & Plan:   Principal Problem:   Complicated UTI (urinary tract infection) Active Problems:   Asthma   PALPITATIONS, OCCASIONAL   Panniculitis   OSA (obstructive sleep apnea)   Generalized anxiety disorder   Major depression, chronic   Thrombocytosis   Hypokalemia   CKD (chronic kidney disease), stage III ([place])  Complicated UTI: Patient has completed 3 days of cefepime, urine culture colonized, currently off antibiotics. Continue to monitor off antibiotics.   Nephrolithiasis Outpatient follow-up with urology.   Suicidal ideation/anxiety and depression Psychiatry has signed off, cleared the patient for discharge.   Continue Wellbutrin, trazodone and gabapentin.   Continue as needed clonazepam. Outpatient follow-up with psychiatry   Chronically disease stage III B--creatinine stable.  Monitor intermittently. She states that she used to be on indapamide in the past.   She was started on Lasix IV 40 mg daily on 11/17/2020.   Continue Lasix 40 mg daily , strict input and output.  Daily weights.  Monitor creatinine.   Generalized deconditioning Homelessness. TOC following.  Will need SNF placement.   Panniculitis Significantly improved.   Asthma Stable, continue current inhalers.   Morbid obesity Outpatient follow-up    DVT prophylaxis: Lovenox Code Status: Full code. Family Communication: No  family at bed side. Disposition Plan:   Status is: Inpatient  Remains inpatient appropriate because:Unsafe d/c plan  Dispo:  Patient From: Homeless  Planned Disposition: [place]  Medically stable for discharge: Yes    Consultants:  Psychiatry  Procedures: None  Antimicrobials:  Anti-infectives (From admission, onward)    Start     Dose/Rate Route Frequency Ordered Stop   11/01/20 1000  ceFEPIme (MAXIPIME) 2 g in sodium chloride 0.9 % 100 mL IVPB        2 g 200 mL/hr over 30 Minutes Intravenous Every 12 hours 10/31/20 1550 11/03/20 2309   11/01/20 0600  ceFEPIme (MAXIPIME) 2 g in sodium chloride 0.9 % 100 mL IVPB  Status:  Discontinued        2 g 200 mL/hr over 30 Minutes Intravenous Every 8 hours 10/31/20 1541 10/31/20 1550   10/31/20 1000  ertapenem (INVANZ) 1,000 mg in sodium chloride 0.9 % 100 mL IVPB  Status:  Discontinued        1 g 200 mL/hr over 30 Minutes Intravenous Every 24 hours 10/31/20 0948 10/31/20 1541        Subjective: Patient was seen and examined at bedside.  Overnight events noted. She is lying comfortably in bed. She is interested in gastric bypass surgery. She denies any other concerns. She is awaiting placement in a SNF.  Objective: Vitals:   11/26/20 1638 11/26/20 2052 11/27/20 0546 11/27/20 0906  BP: 113/63 122/64 110/62 (!) 119/57  Pulse: 76 83 73 78  Resp: '18 18 16 17  '$ Temp: 98.2 F (36.8 C) 98.5 F (36.9 C) 98.1 F (36.7 C) 98.1 F (36.7 C)  TempSrc: Oral Oral Oral Oral  SpO2:  98% 100% 97% 100%  Weight:   (!) 196 kg   Height:        Intake/Output Summary (Last 24 hours) at 11/27/2020 1403 Last data filed at 11/27/2020 1300 Gross per 24 hour  Intake 1080 ml  Output 0 ml  Net 1080 ml   Filed Weights   11/23/20 2126 11/26/20 0500 11/27/20 0546  Weight: (!) 187.7 kg (!) 196 kg (!) 196 kg    Examination:  General exam: Appears chronically ill looking, and deconditioned, not in any acute distress. Respiratory  system: Bilaterally decreased breath sounds but clear,  respiratory effort normal. Cardiovascular system: S1-S2 heard, regular rate and rhythm, no murmur. Gastrointestinal system: Abdomen is soft, nontender, nondistended, normal bowel sounds heard.   Central nervous system: Alert and oriented x 3,  No neurological deficits Extremities: Bilateral lower extremity edema. Skin: No rashes, lesions or ulcers Psychiatry: Mood and affect appropriate.    Data Reviewed: I have personally reviewed following labs and imaging studies  CBC: Recent Labs  Lab 11/23/20 0302  WBC 12.3*  HGB 10.2*  HCT 34.4*  MCV 85.6  PLT [id number]*   Basic Metabolic Panel: Recent Labs  Lab 11/21/20 0906 11/22/20 0305 11/26/20 0137  NA  --  137 135  K  --  3.5 3.5  CL  --  101 101  CO2  --  26 25  GLUCOSE  --  84 93  BUN  --  21* 17  CREATININE 1.40* 1.30* 1.38*  CALCIUM  --  9.4 8.9   GFR: Estimated Creatinine Clearance: 92.1 mL/min (A) (by C-G formula based on SCr of 1.38 mg/dL (H)). Liver Function Tests: No results for input(s): AST, ALT, ALKPHOS, BILITOT, PROT, ALBUMIN in the last 168 hours. No results for input(s): LIPASE, AMYLASE in the last 168 hours. No results for input(s): AMMONIA in the last 168 hours. Coagulation Profile: No results for input(s): INR, PROTIME in the last 168 hours. Cardiac Enzymes: No results for input(s): CKTOTAL, CKMB, CKMBINDEX, TROPONINI in the last 168 hours. BNP (last 3 results) No results for input(s): PROBNP in the last 8760 hours. HbA1C: No results for input(s): HGBA1C in the last 72 hours. CBG: No results for input(s): GLUCAP in the last 168 hours. Lipid Profile: No results for input(s): CHOL, HDL, LDLCALC, TRIG, CHOLHDL, LDLDIRECT in the last 72 hours. Thyroid Function Tests: No results for input(s): TSH, T4TOTAL, FREET4, T3FREE, THYROIDAB in the last 72 hours. Anemia Panel: No results for input(s): VITAMINB12, FOLATE, FERRITIN, TIBC, IRON, RETICCTPCT in the  last 72 hours. Sepsis Labs: No results for input(s): PROCALCITON, LATICACIDVEN in the last 168 hours.  No results found for this or any previous visit (from the past 240 hour(s)).    Radiology Studies: No results found.   Scheduled Meds:  ascorbic acid  500 mg Oral Daily   buPROPion ER  200 mg Oral BID   clotrimazole   Topical BID   enoxaparin (LOVENOX) injection  100 mg Subcutaneous Q24H   fluticasone  2 spray Each Nare BID   furosemide  40 mg Oral Daily   gabapentin  200 mg Oral TID   montelukast  10 mg Oral QHS   nystatin   Topical BID   pantoprazole  40 mg Oral Daily   rOPINIRole  4 mg Oral QHS   senna-docusate  2 tablet Oral Daily   sodium chloride flush  3 mL Intravenous Q12H   traZODone  50 mg Oral QHS   Continuous Infusions:  sodium chloride  LOS: 26 days    Time spent: 25 mins    Shawna Clamp, MD [facility]   If 7PM-7AM, please contact night-coverage PRO

## 2020-11-28 DIAGNOSIS — N390 Urinary tract infection, site not specified: Secondary | ICD-10-CM | POA: Diagnosis not present

## 2020-11-28 LAB — CREATININE, SERUM
Creatinine, Ser: 1.32 mg/dL — ABNORMAL HIGH (ref 0.44–1.00)
GFR, Estimated: 52 mL/min — ABNORMAL LOW (ref >60)

## 2020-11-28 NOTE — Progress Notes (Signed)
Physical Therapy Treatment Patient Details Name: Heather [name] MRN: [id number] DOB: 1975-05-08 Today's Date: 11/28/2020    History of Present Illness 45 y.o. female presented to ED for chest pain and palpitations and feeling like her throat was closing while at her behavioral health facility for SI. Admitted 10/31/20 for treatment of complicated UTI PMH: anxiety and depression (hx of bipolar and schizoaffective disorder in chart in past), ACD, panniculitis, debility,GERD,  OSA not on cpap, chronic back pain.    PT Comments    Progressing with mobility in the room and able to convince her to sit up for lunch.  She still needs more help from lower seat for sit to stand and reports R knee pain/instability with high risk for falls.  PT to continue to follow acutely, goals updated today.  Recommend SNF level rehab at d/c.    Follow Up Recommendations  SNF     Equipment Recommendations  None recommended by PT    Recommendations for Other Services       Precautions / Restrictions Precautions Precautions: Fall Precaution Comments: fall prior to hospitalization Restrictions Weight Bearing Restrictions: No    Mobility  Bed Mobility Overal bed mobility: Needs Assistance Bed Mobility: Supine to Sit     Supine to sit: HOB elevated;Supervision     General bed mobility comments: heavy reliance on rails    Transfers Overall transfer level: Needs assistance Equipment used: Rolling walker (2 wheeled) Transfers: Sit to/from Stand Sit to Stand: Min assist;Supervision;Mod assist         General transfer comment: from bed assist for safety due to high bed, then from 3:1 mod A due to low seat, from recliner, pt requesting to perform on her own and able to with armrests and momentum  Ambulation/Gait Ambulation/Gait assistance: Min guard;+2 safety/equipment Gait Distance (Feet): 15 Feet (& 10') Assistive device: Rolling walker (2 wheeled) Gait Pattern/deviations: Step-through  pattern;Decreased stride length;Antalgic;Trunk flexed     General Gait Details: to [facility], then around bed to recliner, then stood and walked little more, but c/o R knee pain so limited   Stairs             Wheelchair Mobility    Modified Rankin (Stroke Patients Only)       Balance Overall balance assessment: Needs assistance   Sitting balance-Leahy Scale: Good     Standing balance support: Bilateral upper extremity supported Standing balance-Leahy Scale: Poor                              Cognition Arousal/Alertness: Awake/alert Behavior During Therapy: WFL for tasks assessed/performed Overall Cognitive Status: History of cognitive impairments - at baseline Area of Impairment: Safety/judgement;Problem solving                         Safety/Judgement: Decreased awareness of deficits   Problem Solving: Requires verbal cues General Comments: unable to care sufficiently for herself, but wants to get her twin girls back      Exercises      General Comments General comments (skin integrity, edema, etc.): donned shoes for her seated EOB, patient had purewick, but no urine in canister, noted bed soiled with urine, pt able to void on [facility] but needs assist for hygiene. changed gown seated on BSC due to pt c/o feeling hot and changed batteries in her fan      Pertinent Vitals/Pain Pain Assessment: Faces Faces Pain Scale:  Hurts little more Pain Location: R knee Pain Descriptors / Indicators: Aching Pain Intervention(s): Monitored during session;Limited activity within patient's tolerance    Home Living                      Prior Function            PT Goals (current goals can now be found in the care plan section) Acute Rehab PT Goals Patient Stated Goal: be able to get around on her own PT Goal Formulation: With patient Time For Goal Achievement: 12/12/20 Potential to Achieve Goals: Fair Progress towards PT goals: Progressing  toward goals    Frequency    Min 3X/week      PT Plan Current plan remains appropriate    Co-evaluation              AM-PAC PT "6 Clicks" Mobility   Outcome Measure  Help needed turning from your back to your side while in a flat bed without using bedrails?: A Little Help needed moving from lying on your back to sitting on the side of a flat bed without using bedrails?: A Little Help needed moving to and from a bed to a chair (including a wheelchair)?: A Little Help needed standing up from a chair using your arms (e.g., wheelchair or bedside chair)?: A Little Help needed to walk in hospital room?: A Little Help needed climbing 3-5 steps with a railing? : A Lot 6 Click Score: 17    End of Session   Activity Tolerance: Patient tolerated treatment well Patient left: in chair;with call bell/phone within reach   PT Visit Diagnosis: Unsteadiness on feet (R26.81);Muscle weakness (generalized) (M62.81);History of falling (Z91.81)     Time: [id number] PT Time Calculation (min) (ACUTE ONLY): 32 min  Charges:  $Gait Training: 8-22 mins $Therapeutic Activity: 8-22 mins                     Magda Kiel, PT Acute Rehabilitation Services Pager:[phone] Office:[phone] 11/28/2020    [name] 11/28/2020, 12:30 PM

## 2020-11-28 NOTE — Plan of Care (Signed)
  Problem: Activity: Goal: Risk for activity intolerance will decrease Outcome: Progressing   

## 2020-11-28 NOTE — Progress Notes (Signed)
Occupational Therapy Treatment Patient Details Name: Heather [name] MRN: [id number] DOB: 02/10/1976 Today's Date: 11/28/2020    History of present illness 45 y.o. female presented to ED for chest pain and palpitations and feeling like her throat was closing while at her behavioral health facility for SI. Admitted 10/31/20 for treatment of complicated UTI PMH: anxiety and depression (hx of bipolar and schizoaffective disorder in chart in past), ACD, panniculitis, debility,GERD,  OSA not on cpap, chronic back pain.   OT comments  [place] is progressing incrementally. Attempted to motivated pt to walk to sink to groom, or get to chair for dinner however pt declined. Session focused on EOB grooming and exercises. She completed bed mobility with supervision- MinA with heavy use of the rails and completed one sit<>Stand but declined further trials due to knee pain. She completed BUE exercises while sitting EOB. Pt wishes to wash her hair, offered to assist but states she will do it tomorrow instead. Pt demonstrating self-limiting behaviors. D/c plan remains appropriate.    Follow Up Recommendations  SNF;Supervision/Assistance - 24 hour    Equipment Recommendations  Other (comment) Defer to SNF   Recommendations for Other Services Other (comment)    Precautions / Restrictions Precautions Precautions: Fall Precaution Comments: fall prior to hospitalization Restrictions Weight Bearing Restrictions: No       Mobility Bed Mobility Overal bed mobility: Needs Assistance Bed Mobility: Supine to Sit;Sit to Supine     Supine to sit: HOB elevated;Supervision Sit to supine: Min assist   General bed mobility comments: heavy reliance on rails    Transfers Overall transfer level: Needs assistance Equipment used: Rolling walker (2 wheeled) Transfers: Sit to/from Stand Sit to Stand: Min assist         General transfer comment: min A to power up, pt with extreme forward flexion     Balance Overall balance assessment: Needs assistance Sitting-balance support: Feet supported Sitting balance-Leahy Scale: Good     Standing balance support: Bilateral upper extremity supported Standing balance-Leahy Scale: Poor                             ADL either performed or assessed with clinical judgement   ADL Overall ADL's : Needs assistance/impaired     Grooming: Set up;Sitting Grooming Details (indicate cue type and reason): grooming tasks completed while sitting EOB, declined standing tasks                             Functional mobility during ADLs: Minimal assistance;Rolling walker General ADL Comments: pt declined ADLs but wishes to wash her hair. Offered to assist with this task while sitting EOB, pt declined and would like to complete hair washign supine. Grooming and exercises completed EOb      Cognition Arousal/Alertness: Awake/alert Behavior During Therapy: WFL for tasks assessed/performed Overall Cognitive Status: History of cognitive impairments - at baseline Area of Impairment: Safety/judgement;Problem solving         Safety/Judgement: Decreased awareness of deficits   Problem Solving: Requires verbal cues General Comments: unable to care sufficiently for herself, but wants to get her twin girls back        Exercises Other Exercises Other Exercises: Completed BUE exercises while sitting EOB: 20 alternating or 10 each arm of forward punches, above head reaching, abuction, scapular squeezes   Shoulder Instructions       General Comments pt with complaints of soreness  form sitting up in the chair, declined to get back into the chair.    Pertinent Vitals/ Pain       Pain Assessment: Faces Faces Pain Scale: Hurts a little bit Pain Location: R knee & neck Pain Descriptors / Indicators: Aching Pain Intervention(s): Limited activity within patient's tolerance;Monitored during session         Frequency  Min 2X/week         Progress Toward Goals  OT Goals(current goals can now be found in the care plan section)  Progress towards OT goals: Progressing toward goals  Acute Rehab OT Goals Patient Stated Goal: be able to get around on her own OT Goal Formulation: With patient Time For Goal Achievement: 12/05/20 Potential to Achieve Goals: Fair ADL Goals Pt Will Perform Grooming: with set-up;sitting Pt Will Perform Lower Body Bathing: with supervision;sit to/from stand;sitting/lateral leans;with adaptive equipment Pt Will Perform Lower Body Dressing: with supervision;sit to/from stand;sitting/lateral leans;with adaptive equipment Pt Will Transfer to Toilet: with supervision;ambulating;bedside commode Pt Will Perform Toileting - Clothing Manipulation and hygiene: with supervision;sitting/lateral leans;sit to/from stand;with adaptive equipment Pt Will Perform Tub/Shower Transfer: with supervision;tub bench;rolling walker  Plan Discharge plan remains appropriate;Frequency remains appropriate       AM-PAC OT "6 Clicks" Daily Activity     Outcome Measure   Help from another person eating meals?: None Help from another person taking care of personal grooming?: A Little Help from another person toileting, which includes using toliet, bedpan, or urinal?: A Lot Help from another person bathing (including washing, rinsing, drying)?: A Lot Help from another person to put on and taking off regular upper body clothing?: A Little Help from another person to put on and taking off regular lower body clothing?: A Lot 6 Click Score: 16    End of Session Equipment Utilized During Treatment: Gait belt;Rolling walker  OT Visit Diagnosis: Unsteadiness on feet (R26.81);Other abnormalities of gait and mobility (R26.89);Muscle weakness (generalized) (M62.81);Pain;Other (comment) Pain - Right/Left: Right Pain - part of body: Knee   Activity Tolerance Patient tolerated treatment well   Patient Left in chair;with  call bell/phone within reach   Nurse Communication Mobility status        Time: [id number] OT Time Calculation (min): 23 min  Charges: OT General Charges $OT Visit: 1 Visit OT Treatments $Self Care/Home Management : 8-22 mins $Therapeutic Exercise: 23-37 mins    Kanoe Wanner A Tayva Easterday 11/28/2020, 4:50 PM

## 2020-11-28 NOTE — Progress Notes (Signed)
PROGRESS NOTE    Marka Notch Durden  [id number] DOB: 1976-01-22 DOA: 10/30/2020 PCP: Lucianne Lei, MD   Brief Narrative:  This 45 year old female with morbid obesity, obstructive sleep apnea, COPD, MDD, GAD, insomnia and RLS, diabetes mellitus type 2,renal mass,  and numerous small wounds under her pannus, upper thighs and between her legs presented with right flank and suprapubic pain along with nausea and vomiting.  She was admitted and treated for complicated UTI with antibiotics and has completed treatment.  Currently medically stable for discharge to SNF.  Assessment & Plan:   Principal Problem:   Complicated UTI (urinary tract infection) Active Problems:   Asthma   PALPITATIONS, OCCASIONAL   Panniculitis   OSA (obstructive sleep apnea)   Generalized anxiety disorder   Major depression, chronic   Thrombocytosis   Hypokalemia   CKD (chronic kidney disease), stage III ([place])  Complicated UTI: Patient has completed 3 days of cefepime, urine culture colonized, currently off antibiotics. Continue to monitor off antibiotics.   Nephrolithiasis Outpatient follow-up with urology.   Suicidal ideation/anxiety and depression Psychiatry has signed off, cleared the patient for discharge.   Continue Wellbutrin, trazodone and gabapentin.   Continue as needed clonazepam. Outpatient follow-up with psychiatry   Chronically disease stage III B--creatinine stable.  Monitor intermittently. She states that she used to be on indapamide in the past.   She was started on Lasix IV 40 mg daily on 11/17/2020.   Continue Lasix 40 mg daily , strict input and output.  Daily weights.  Monitor creatinine.   Generalized deconditioning Homelessness. TOC following.  Will need SNF placement.   Panniculitis Significantly improved.   Asthma Stable, continue current inhalers.   Morbid obesity Outpatient follow-up    DVT prophylaxis: Lovenox Code Status: Full code. Family Communication: No  family at bed side. Disposition Plan:   Status is: Inpatient  Remains inpatient appropriate because:Unsafe d/c plan  Dispo:  Patient From: Homeless  Planned Disposition: [facility]  Medically stable for discharge: Yes  Barriers:No skilled nursing facility accept her with that [facility]    Consultants:  Psychiatry  Procedures: None  Antimicrobials:  Anti-infectives (From admission, onward)    Start     Dose/Rate Route Frequency Ordered Stop   11/01/20 1000  ceFEPIme (MAXIPIME) 2 g in sodium chloride 0.9 % 100 mL IVPB        2 g 200 mL/hr over 30 Minutes Intravenous Every 12 hours 10/31/20 1550 11/03/20 2309   11/01/20 0600  ceFEPIme (MAXIPIME) 2 g in sodium chloride 0.9 % 100 mL IVPB  Status:  Discontinued        2 g 200 mL/hr over 30 Minutes Intravenous Every 8 hours 10/31/20 1541 10/31/20 1550   10/31/20 1000  ertapenem (INVANZ) 1,000 mg in sodium chloride 0.9 % 100 mL IVPB  Status:  Discontinued        1 g 200 mL/hr over 30 Minutes Intravenous Every 24 hours 10/31/20 0948 10/31/20 1541        Subjective: Patient was seen and examined at bedside.  Overnight events noted. She is lying comfortably in the bed. she is interested in gastric bypass surgery.   She denies any other concerns. She is awaiting placement in  SNF.  Objective: Vitals:   11/27/20 1830 11/27/20 2102 11/28/20 0521 11/28/20 1011  BP: 114/71 106/60 122/60 124/77  Pulse: 93 81 67 81  Resp: '18 16 20 18  '$ Temp: 98.1 F (36.7 C) 98 F (36.7 C) 98.7 F (37.1 C)  99 F (37.2 C)  TempSrc:  Oral  Oral  SpO2: 97% 99% 99% 98%  Weight:      Height:        Intake/Output Summary (Last 24 hours) at 11/28/2020 1312 Last data filed at 11/28/2020 0845 Gross per 24 hour  Intake 1140 ml  Output 750 ml  Net 390 ml   Filed Weights   11/23/20 2126 11/26/20 0500 11/27/20 0546  Weight: (!) 187.7 kg (!) 196 kg (!) 196 kg    Examination:  General exam: Appears chronically ill looking, and  deconditioned, not in any acute distress. Respiratory system: Bilaterally decreased breath sounds but clear,  respiratory effort normal. Cardiovascular system: S1-S2 heard, regular rate and rhythm, no murmur. Gastrointestinal system: Abdomen is soft, nontender, nondistended, normal bowel sounds heard.   Central nervous system: Alert and oriented x 3,  No neurological deficits Extremities: Bilateral lower extremity edema. Skin: No rashes, lesions or ulcers Psychiatry: Mood and affect appropriate.    Data Reviewed: I have personally reviewed following labs and imaging studies  CBC: Recent Labs  Lab 11/23/20 0302  WBC 12.3*  HGB 10.2*  HCT 34.4*  MCV 85.6  PLT [id number]*   Basic Metabolic Panel: Recent Labs  Lab 11/22/20 0305 11/26/20 0137 11/28/20 0541  NA 137 135  --   K 3.5 3.5  --   CL 101 101  --   CO2 26 25  --   GLUCOSE 84 93  --   BUN 21* 17  --   CREATININE 1.30* 1.38* 1.32*  CALCIUM 9.4 8.9  --    GFR: Estimated Creatinine Clearance: 96.2 mL/min (A) (by C-G formula based on SCr of 1.32 mg/dL (H)). Liver Function Tests: No results for input(s): AST, ALT, ALKPHOS, BILITOT, PROT, ALBUMIN in the last 168 hours. No results for input(s): LIPASE, AMYLASE in the last 168 hours. No results for input(s): AMMONIA in the last 168 hours. Coagulation Profile: No results for input(s): INR, PROTIME in the last 168 hours. Cardiac Enzymes: No results for input(s): CKTOTAL, CKMB, CKMBINDEX, TROPONINI in the last 168 hours. BNP (last 3 results) No results for input(s): PROBNP in the last 8760 hours. HbA1C: No results for input(s): HGBA1C in the last 72 hours. CBG: No results for input(s): GLUCAP in the last 168 hours. Lipid Profile: No results for input(s): CHOL, HDL, LDLCALC, TRIG, CHOLHDL, LDLDIRECT in the last 72 hours. Thyroid Function Tests: No results for input(s): TSH, T4TOTAL, FREET4, T3FREE, THYROIDAB in the last 72 hours. Anemia Panel: No results for input(s):  VITAMINB12, FOLATE, FERRITIN, TIBC, IRON, RETICCTPCT in the last 72 hours. Sepsis Labs: No results for input(s): PROCALCITON, LATICACIDVEN in the last 168 hours.  No results found for this or any previous visit (from the past 240 hour(s)).    Radiology Studies: No results found.   Scheduled Meds:  ascorbic acid  500 mg Oral Daily   buPROPion ER  200 mg Oral BID   clotrimazole   Topical BID   enoxaparin (LOVENOX) injection  100 mg Subcutaneous Q24H   fluticasone  2 spray Each Nare BID   furosemide  40 mg Oral Daily   gabapentin  200 mg Oral TID   montelukast  10 mg Oral QHS   nystatin   Topical BID   pantoprazole  40 mg Oral Daily   rOPINIRole  4 mg Oral QHS   senna-docusate  2 tablet Oral Daily   sodium chloride flush  3 mL Intravenous Q12H   traZODone  50 mg  Oral QHS   Continuous Infusions:  sodium chloride       LOS: 27 days    Time spent: 25 mins    Shawna Clamp, MD [facility]   If 7PM-7AM, please contact night-coverage PRO

## 2020-11-29 DIAGNOSIS — N390 Urinary tract infection, site not specified: Secondary | ICD-10-CM | POA: Diagnosis not present

## 2020-11-29 NOTE — Progress Notes (Signed)
PROGRESS NOTE  Heather [name]  DOB: 11-21-1975  PCP: Lucianne Lei, MD [id number]  [place]: 10/30/2020  LOS: 28 days  Hospital Day: 40   Chief Complaint  Patient presents with   BH/CP    Brief narrative: Heather [name] is a 45 y.o. female with PMH significant for morbid obesity, DM2, obstructive sleep apnea, COPD, MDD, GAD, insomnia and RLS,renal mass,  and numerous small wounds under her pannus, upper thighs and between her legs.   Patient presented to the ED on 7/25 presented with right flank and suprapubic pain along with nausea and vomiting.   She was admitted and treated for complicated UTI with antibiotics and has completed treatment.  Currently medically stable for discharge to SNF.  Subjective: Patient was seen and examined this morning.  Morbidly obese Caucasian female. Lying down on bed.  Assessment/Plan: Complicated UTI -Patient completed 3 days of cefepime, urine culture colonized, currently off antibiotics. -Continue to monitor off antibiotics.   Nonobstructing nephrolithiasis Renal cyst -CT abdomen and pelvis from 7/26 showed bilateral nonobstructing stones including a staghorn calculus on the left.  Patient does not need any procedure at this time.  Can follow-up with urology as an outpatient. -CT scan abdomen from 7/19 also showed up 8.3 cm right upper pole renal cyst likely benign.   Suicidal ideation/anxiety and depression -Psychiatry has signed off, cleared the patient for discharge.   -Continue Wellbutrin, trazodone and gabapentin.   -Continue as needed clonazepam. Outpatient follow-up with psychiatry   CKD 3B -creatinine stable.  Monitor intermittently. -Used to be on indapamide in the past.  Currently on Lasix 40 mg daily.  Creatinine stable. Recent Labs    10/30/20 1414 11/01/20 0350 11/02/20 0321 11/03/20 0950 11/05/20 0217 11/07/20 0521 11/11/20 0417 11/14/20 0435 11/18/20 0649 11/19/20 0501 11/21/20 0906 11/22/20 0305  11/26/20 0137 11/28/20 0541  BUN '8 13 9 8 9  '$ --  16  --  20 19  --  21* 17  --   CREATININE 1.42* 1.57* 1.46* 1.24* 1.21* 1.16* 1.22* 1.29* 1.40* 1.24* 1.40* 1.30* 1.38* 1.32*    Generalized deconditioning Homelessness TOC following.  Will need SNF placement.   Panniculitis Significantly improved.   Asthma Stable, continue current inhalers.   Morbid obesity  -Body mass index is 76.53 kg/m. Patient has been advised to make an attempt to improve diet and exercise patterns to aid in weight loss.  Generalized anxiety disorder/restless legs -Continue Wellbutrin, BuSpar, Neurontin, Requip.   Mobility: limited mobility because of Code Status:   Code Status: Full Code morbid obesity Nutritional status: Body mass index is 76.53 kg/m.     Diet:  Diet Order             Diet regular Room service appropriate? Yes; Fluid consistency: Thin  Diet effective now           Diet general                  DVT prophylaxis: Lovenox subcu   Antimicrobials: None Fluid: None Consultants: None Family Communication: None at bedside  Status is: Inpatient  Remains inpatient appropriate because: Pending placement  Dispo: The patient is from: Homeless              Anticipated d/c is to: Pending SNF              Patient currently is medically stable to d/c.   Difficult to place patient Yes     Infusions:   sodium chloride  Scheduled Meds:  ascorbic acid  500 mg Oral Daily   buPROPion ER  200 mg Oral BID   clotrimazole   Topical BID   enoxaparin (LOVENOX) injection  100 mg Subcutaneous Q24H   fluticasone  2 spray Each Nare BID   furosemide  40 mg Oral Daily   gabapentin  200 mg Oral TID   montelukast  10 mg Oral QHS   nystatin   Topical BID   pantoprazole  40 mg Oral Daily   rOPINIRole  4 mg Oral QHS   senna-docusate  2 tablet Oral Daily   sodium chloride flush  3 mL Intravenous Q12H   traZODone  50 mg Oral QHS    Antimicrobials: Anti-infectives (From admission,  onward)    Start     Dose/Rate Route Frequency Ordered Stop   11/01/20 1000  ceFEPIme (MAXIPIME) 2 g in sodium chloride 0.9 % 100 mL IVPB        2 g 200 mL/hr over 30 Minutes Intravenous Every 12 hours 10/31/20 1550 11/03/20 2309   11/01/20 0600  ceFEPIme (MAXIPIME) 2 g in sodium chloride 0.9 % 100 mL IVPB  Status:  Discontinued        2 g 200 mL/hr over 30 Minutes Intravenous Every 8 hours 10/31/20 1541 10/31/20 1550   10/31/20 1000  ertapenem (INVANZ) 1,000 mg in sodium chloride 0.9 % 100 mL IVPB  Status:  Discontinued        1 g 200 mL/hr over 30 Minutes Intravenous Every 24 hours 10/31/20 0948 10/31/20 1541       PRN meds: sodium chloride, acetaminophen **OR** acetaminophen, albuterol, clonazepam, ipratropium-albuterol, nitroGLYCERIN, ondansetron **OR** ondansetron (ZOFRAN) IV, senna-docusate, sodium chloride flush, traMADol, witch hazel-glycerin   Objective: Vitals:   11/29/20 0559 11/29/20 0953  BP: (!) 117/57 116/65  Pulse: 71 78  Resp: 17 16  Temp: 97.7 F (36.5 C) 98.4 F (36.9 C)  SpO2: 96% 97%    Intake/Output Summary (Last 24 hours) at 11/29/2020 1509 Last data filed at 11/29/2020 1400 Gross per 24 hour  Intake 1017 ml  Output 750 ml  Net 267 ml   Filed Weights   11/23/20 2126 11/26/20 0500 11/27/20 0546  Weight: (!) 187.7 kg (!) 196 kg (!) 196 kg   Weight change:  Body mass index is 76.53 kg/m.   Physical Exam: General exam: Pleasant, middle-aged Caucasian female, morbidly obese Skin: No rashes, lesions or ulcers. HEENT: Atraumatic, normocephalic, no obvious bleeding Lungs: Clear to auscultation bilaterally CVS: Regular rate and rhythm, no murmur GI/Abd soft, distended abdomen from obesity, nontender, bowel sounds present CNS: Alert, awake, oriented x3 Psychiatry: Anxious Extremities: Chronic stasis changes  Data Review: I have personally reviewed the laboratory data and studies available.  Recent Labs  Lab 11/23/20 0302  WBC 12.3*  HGB 10.2*   HCT 34.4*  MCV 85.6  PLT 413*   Recent Labs  Lab 11/26/20 0137 11/28/20 0541  NA 135  --   K 3.5  --   CL 101  --   CO2 25  --   GLUCOSE 93  --   BUN 17  --   CREATININE 1.38* 1.32*  CALCIUM 8.9  --     F/u labs ordered Unresulted Labs (From admission, onward)     Start     Ordered   11/07/20 0500  Creatinine, serum  (enoxaparin (LOVENOX)    CrCl >/= 30 ml/min)  Weekly,   R     Comments: while on enoxaparin therapy  10/31/20 1548            Signed, Terrilee Croak, MD [facility] 11/29/2020

## 2020-11-29 NOTE — NC FL2 (Addendum)
[place] LEVEL OF CARE SCREENING TOOL     IDENTIFICATION  Patient Name: Heather [name] Birthdate: October 15, 1975 Sex: female Admission Date (Current Location): 10/30/2020  [place] and [place] Number:  [name] [id number] [place] and Address:  [facility] [place]. [facility], [place] [address] [place], [place] [address]      Provider Number: [id number]  Attending Physician Name and Address:  Terrilee Croak, MD [phone] [address], [place]  [address] Relative Name and Phone Number:       Current Level of Care: Hospital Recommended Level of Care: Nursing Facility (Patient needs Long-term Care bed in [place]) Prior Approval Number:    Date Approved/Denied:   PASRR Number: [id number] F - Eff. 8/11 - 12/16/20  Discharge Plan: SNF (Long-term Care bed)    Current Diagnoses: Patient Active Problem List   Diagnosis Date Noted   Major depressive disorder, recurrent severe without psychotic features ([place]) 123456   Complicated UTI (urinary tract infection) 10/31/2020   Thrombocytosis 10/31/2020   Hypokalemia 10/31/2020   CKD (chronic kidney disease), stage III ([place]) 10/31/2020   Adjustment disorder with mixed anxiety and depressed mood 10/27/2020   Major depression, chronic 09/13/2020   Insomnia    Anemia of chronic disease    Hyponatremia    AKI (acute kidney injury) ([place])    Chronic bilateral low back pain without sciatica    Debility 08/24/2020   SIRS (systemic inflammatory response syndrome) ([place]) 08/18/2020   Pressure injury of skin 08/11/2020   Rhabdomyolysis 08/10/2020   Acute renal failure ([place]) 08/10/2020   PTSD (post-traumatic stress disorder) 05/15/2020   Generalized anxiety disorder 05/05/2020   Blood in stool    Gastritis and gastroduodenitis    Benign neoplasm of sigmoid colon    Difficult intubation 08/10/2018   Class 3 severe obesity with serious comorbidity and body mass index (BMI) greater than or equal to 70 in adult  ([place]) 06/16/2018   Nexplanon insertion 03/27/2017   Postpartum hypertension 03/05/2017   Status post primary low transverse cesarean section 02/03/2017   H/O pre-eclampsia 01/27/2017   Gestational htn w/o significant proteinuria, third trimester 01/20/2017   Mild persistent asthma without complication [id number]   Perennial allergic rhinitis 11/05/2016   Mild persistent asthma with acute exacerbation 11/05/2016   Excess weight gain in pregnancy, second trimester 10/30/2016   Hard to intubate 11/07/2015   Hypoglycemia 11/07/2015   OSA (obstructive sleep apnea) 11/07/2015   DM type 2 (diabetes mellitus, type 2) ([place]) 12/07/2014   Panniculitis 12/06/2014   Cellulitis, abdominal wall 11/11/2014   Abdominal pain 07/14/2014   Loose stools 07/14/2014   Melena [id number]   Eosinophilic esophagitis [id number]   Change in bowel habits 04/28/2013   Esophageal dysphagia 04/28/2013   Insomnia due to mental disorder(327.02) 08/08/2011   RLS (restless legs syndrome) 08/08/2011   PALPITATIONS, OCCASIONAL 11/01/2009   Leucocytosis 07/28/2008   ALLERGIC RHINITIS, SEASONAL [id number]   DYSMETABOLIC SYNDROME [id number]   Morbid obesity ([place]) 05/13/2006   EXTERNAL HEMORRHOIDS 05/13/2006   HYPERLIPIDEMIA 05/12/2006   Essential hypertension 05/12/2006   Asthma 05/12/2006   OSTEOARTHRITIS 05/12/2006    Orientation RESPIRATION BLADDER Height & Weight     Self, Time, Situation, Place  Normal Continent Weight: (!) 432 lb (196 kg) Height:  '5\' 3"'$  (160 cm)  BEHAVIORAL SYMPTOMS/MOOD NEUROLOGICAL BOWEL NUTRITION STATUS      Continent Diet (Regular)  AMBULATORY STATUS COMMUNICATION OF NEEDS Skin   Limited Assist [employer], +2 asssistance for safety and equipment) Verbally Other (Comment) (  Moisture Associated Skin Damage to left posterior knee and lower right abdomen; Ecchymosis bilateral abdomen)                       Personal Care Assistance Level of Assistance  Bathing, Feeding, Dressing  Bathing Assistance: Limited assistance Feeding assistance: Independent Dressing Assistance: Limited assistance     Functional Limitations Info  Sight, Hearing, Speech Sight Info: Adequate Hearing Info: Adequate Speech Info: Adequate    SPECIAL CARE FACTORS FREQUENCY  PT (By licensed PT), OT (By licensed OT)     PT Frequency: Evaluated 10/2820 OT Frequency: Evaluated 11/02/20            Contractures Contractures Info: Not present    Additional Factors Info  Code Status, Allergies Code Status Info: Full Allergies Info: Amoxicillin. Bee Venom, Penicillins, Penicillin G, Adhesive (Tape), Latex, Vancomycin           Current Medications (11/29/2020):  This is the current hospital active medication list Current Facility-Administered Medications  Medication Dose Route Frequency Provider Last Rate Last Admin   0.9 %  sodium chloride infusion  250 mL Intravenous PRN Orma Flaming, MD       acetaminophen (TYLENOL) tablet 650 mg  650 mg Oral Q6H PRN Orma Flaming, MD   650 mg at 11/26/20 2245   Or   acetaminophen (TYLENOL) suppository 650 mg  650 mg Rectal Q6H PRN Orma Flaming, MD       albuterol (VENTOLIN HFA) 108 (90 Base) MCG/ACT inhaler 2 puff  2 puff Inhalation Q4H PRN Orma Flaming, MD   2 puff at 11/11/20 1842   ascorbic acid (VITAMIN C) tablet 500 mg  500 mg Oral Daily Orma Flaming, MD   500 mg at 11/29/20 0948   buPROPion ER (WELLBUTRIN SR) 12 hr tablet 200 mg  200 mg Oral BID Orma Flaming, MD   200 mg at 11/29/20 [id number]   clonazePAM (KLONOPIN) disintegrating tablet 0.25 mg  0.25 mg Oral BID PRN Debbe Odea, MD   0.25 mg at 11/27/20 2035   clotrimazole (LOTRIMIN) 1 % cream   Topical BID Orma Flaming, MD   Given at 11/29/20 [id number]   enoxaparin (LOVENOX) injection 100 mg  100 mg Subcutaneous Q24H Orma Flaming, MD   100 mg at 11/29/20 0949   fluticasone (FLONASE) 50 MCG/ACT nasal spray 2 spray  2 spray Each Nare BID Orma Flaming, MD   2 spray at 11/29/20 [id number]    furosemide (LASIX) tablet 40 mg  40 mg Oral Daily Starla Link, Kshitiz, MD   40 mg at 11/29/20 0948   gabapentin (NEURONTIN) capsule 200 mg  200 mg Oral TID Damita Dunnings B, MD   200 mg at 11/29/20 0947   ipratropium-albuterol (DUONEB) 0.5-2.5 (3) MG/3ML nebulizer solution 3 mL  3 mL Nebulization Q4H PRN Aline August, MD   3 mL at 11/20/20 1516   montelukast (SINGULAIR) tablet 10 mg  10 mg Oral QHS Orma Flaming, MD   10 mg at 11/28/20 2110   nitroGLYCERIN (NITROSTAT) SL tablet 0.4 mg  0.4 mg Sublingual Q5 min PRN Shelly Coss, MD   0.4 mg at 11/11/20 1854   nystatin (MYCOSTATIN/NYSTOP) topical powder   Topical BID Orma Flaming, MD   Given at 11/29/20 0952   ondansetron [facility]) tablet 4 mg  4 mg Oral Q6H PRN Orma Flaming, MD   4 mg at 11/22/20 0951   Or   ondansetron (ZOFRAN) injection 4 mg  4 mg Intravenous Q6H PRN Orma Flaming,  MD   4 mg at 11/03/20 0843   pantoprazole (PROTONIX) EC tablet 40 mg  40 mg Oral Daily Shelly Coss, MD   40 mg at 11/29/20 0948   rOPINIRole (REQUIP) tablet 4 mg  4 mg Oral QHS Orma Flaming, MD   4 mg at 11/28/20 2110   senna-docusate (Senokot-S) tablet 1 tablet  1 tablet Oral QHS PRN Orma Flaming, MD       senna-docusate (Senokot-S) tablet 2 tablet  2 tablet Oral Daily Orma Flaming, MD   2 tablet at 11/29/20 0947   sodium chloride flush (NS) 0.9 % injection 3 mL  3 mL Intravenous Q12H Orma Flaming, MD   3 mL at 11/29/20 0951   sodium chloride flush (NS) 0.9 % injection 3 mL  3 mL Intravenous PRN Orma Flaming, MD       traMADol Veatrice [facility]bon) tablet 50 mg  50 mg Oral Q8H PRN Orma Flaming, MD   50 mg at 11/26/20 2245   traZODone (DESYREL) tablet 50 mg  50 mg Oral QHS Damita Dunnings B, MD   50 mg at 11/28/20 2111   witch hazel-glycerin (TUCKS) pad   Topical PRN Shelly Coss, MD         Discharge Medications: Please see discharge summary for a list of discharge medications.  Relevant Imaging Results:  Relevant Lab Results:   Additional  Information ss#[id number]. Had had 1st pfizer vaccone and is requesting to get the 2nd vaccination. Patient is 432 pounds  Sharlet Salina, Mila Homer, [place]

## 2020-11-29 NOTE — TOC Progression Note (Signed)
Transition of Care [facility]) - Progression Note    Patient Details  Name: Hallye Blandin [place] MRN: [id number] Date of Birth: 10/16/1975  Transition of Care [facility]) CM/SW Contact  [name], LCSW Phone Number: 11/29/2020, 4:09 PM  Clinical Narrative:  Contacted patient's Medicaid worker, Earnest Bailey and spoke with her regarding patient and trying to get long-term care Medicaid for her. Crystal indicated that an FL-2 is needed showing approval for Medicaid. Per Crystal, the FL-2 must be signed by the doctor and then submitted to DSS. Once she gets the FL-2 approval, then she will submit the FL-2 to Long-term Care or special assistance worker. Her e-mail is: [email or web address].  Visited with patient and update provided.      Expected Discharge Plan: Skilled Nursing Facility Barriers to Discharge: [place] [facility]), Homeless with medical needs (Homeless)  Expected Discharge Plan and Services Expected Discharge Plan: [place] In-house Referral: Clinical Social Work   Post Acute Care Choice: [place] (Needs LTC bed) Living arrangements for the past 2 months: Homeless Expected Discharge Date: 11/02/20                                     Social Determinants of Health (SDOH) Interventions  Patient is homeless.  Readmission Risk Interventions No flowsheet data found.

## 2020-11-30 DIAGNOSIS — N390 Urinary tract infection, site not specified: Secondary | ICD-10-CM | POA: Diagnosis not present

## 2020-11-30 NOTE — TOC Progression Note (Signed)
Transition of Care [facility]) - Progression Note    Patient Details  Name: Heather [name] MRN: [id number] Date of Birth: 1976-02-18  Transition of Care [facility]) CM/SW Contact  [name], LCSW Phone Number: 11/30/2020, 1:49 PM  Clinical Narrative:  FL-2 completed and emailed to Adult Medicaid worker Earnest Bailey at [email or web address]. Called Ms. Winfred and VM left informing her that FL-2 emailed to her. CSW will continue to follow and will call Medicaid worker if no response from her before leaving on Friday.     Expected Discharge Plan: Skilled Nursing Facility Barriers to Discharge: [place] [facility]), Homeless with medical needs (Homeless)  Expected Discharge Plan and Services Expected Discharge Plan: [place] In-house Referral: Clinical Social Work   Post Acute Care Choice: [place] (Needs LTC bed) Living arrangements for the past 2 months: Homeless Expected Discharge Date: 11/02/20                                     Social Determinants of Health (SDOH) Interventions  Patient is homeless and needs long-term skilled nursing facility placement.  Readmission Risk Interventions No flowsheet data found.

## 2020-11-30 NOTE — Progress Notes (Signed)
PROGRESS NOTE  Heather [name]  DOB: 1976-04-01  PCP: Lucianne Lei, MD [id number]  [place]: 10/30/2020  LOS: 51 days  Hospital Day: 3   Chief Complaint  Patient presents with   BH/CP    Brief narrative: Heather [name] is a 45 y.o. female with PMH significant for morbid obesity, DM2, obstructive sleep apnea, COPD, MDD, GAD, insomnia and RLS,renal mass,  and numerous small wounds under her pannus, upper thighs and between her legs.   Patient presented to the ED on 7/25 presented with right flank and suprapubic pain along with nausea and vomiting.   She was admitted and treated for complicated UTI with antibiotics and has completed treatment.  Currently medically stable for discharge to SNF.  Subjective: Patient was seen and examined this morning.   Lying on bed.  Not in distress.  No new symptoms.  Pending SNF bed availability.  Assessment/Plan: Complicated UTI -Patient completed 3 days of cefepime, urine culture colonized, currently off antibiotics. -Continue to monitor off antibiotics.   Nonobstructing nephrolithiasis Renal cyst -CT abdomen and pelvis from 7/26 showed bilateral nonobstructing stones including a staghorn calculus on the left.  Patient does not need any procedure at this time.  Can follow-up with urology as an outpatient. -CT scan abdomen from 7/19 also showed up 8.3 cm right upper pole renal cyst likely benign.   Suicidal ideation/anxiety and depression -Psychiatry has signed off, cleared the patient for discharge.   -Continue Wellbutrin, trazodone and gabapentin.   -Continue as needed clonazepam. Outpatient follow-up with psychiatry   CKD 3B -Used to be on indapamide in the past.  Currently on Lasix 40 mg daily.  Creatinine stable on last blood work done on 8/23. Recent Labs    10/30/20 1414 11/01/20 0350 11/02/20 0321 11/03/20 0950 11/05/20 0217 11/07/20 0521 11/11/20 0417 11/14/20 0435 11/18/20 0649 11/19/20 0501 11/21/20 0906  11/22/20 0305 11/26/20 0137 11/28/20 0541  BUN '8 13 9 8 9  '$ --  16  --  20 19  --  21* 17  --   CREATININE 1.42* 1.57* 1.46* 1.24* 1.21* 1.16* 1.22* 1.29* 1.40* 1.24* 1.40* 1.30* 1.38* 1.32*     Generalized deconditioning Homelessness -TOC following.  Will need SNF placement.   Panniculitis -Significantly improved.   Asthma -Stable, continue current inhalers.   Morbid obesity  -Body mass index is 76.53 kg/m. Patient has been advised to make an attempt to improve diet and exercise patterns to aid in weight loss.  Generalized anxiety disorder/restless legs -Continue Wellbutrin, BuSpar, Neurontin, Requip.   Mobility: limited mobility because of morbid obesity Code Status:   Code Status: Full Code  Nutritional status: Body mass index is 76.53 kg/m.     Diet:  Diet Order             Diet regular Room service appropriate? Yes; Fluid consistency: Thin  Diet effective now           Diet general                  DVT prophylaxis: Lovenox subcu   Antimicrobials: None Fluid: None Consultants: None Family Communication: None at bedside  Status is: Inpatient  Remains inpatient appropriate because: Pending SNF placement  Dispo: The patient is from: Homeless              Anticipated d/c is to: Pending SNF              Patient currently is medically stable to d/c.   Difficult to  place patient Yes     Infusions:   sodium chloride      Scheduled Meds:  ascorbic acid  500 mg Oral Daily   buPROPion ER  200 mg Oral BID   clotrimazole   Topical BID   enoxaparin (LOVENOX) injection  100 mg Subcutaneous Q24H   fluticasone  2 spray Each Nare BID   furosemide  40 mg Oral Daily   gabapentin  200 mg Oral TID   montelukast  10 mg Oral QHS   nystatin   Topical BID   pantoprazole  40 mg Oral Daily   rOPINIRole  4 mg Oral QHS   senna-docusate  2 tablet Oral Daily   sodium chloride flush  3 mL Intravenous Q12H   traZODone  50 mg Oral QHS     Antimicrobials: Anti-infectives (From admission, onward)    Start     Dose/Rate Route Frequency Ordered Stop   11/01/20 1000  ceFEPIme (MAXIPIME) 2 g in sodium chloride 0.9 % 100 mL IVPB        2 g 200 mL/hr over 30 Minutes Intravenous Every 12 hours 10/31/20 1550 11/03/20 2309   11/01/20 0600  ceFEPIme (MAXIPIME) 2 g in sodium chloride 0.9 % 100 mL IVPB  Status:  Discontinued        2 g 200 mL/hr over 30 Minutes Intravenous Every 8 hours 10/31/20 1541 10/31/20 1550   10/31/20 1000  ertapenem (INVANZ) 1,000 mg in sodium chloride 0.9 % 100 mL IVPB  Status:  Discontinued        1 g 200 mL/hr over 30 Minutes Intravenous Every 24 hours 10/31/20 0948 10/31/20 1541       PRN meds: sodium chloride, acetaminophen **OR** acetaminophen, albuterol, clonazepam, ipratropium-albuterol, nitroGLYCERIN, ondansetron **OR** ondansetron (ZOFRAN) IV, senna-docusate, sodium chloride flush, traMADol, witch hazel-glycerin   Objective: Vitals:   11/29/20 2031 11/30/20 0959  BP: 123/71 118/61  Pulse: 83 83  Resp: 18 18  Temp: 98.9 F (37.2 C) 98.3 F (36.8 C)  SpO2: 97% 96%    Intake/Output Summary (Last 24 hours) at 11/30/2020 1125 Last data filed at 11/30/2020 0805 Gross per 24 hour  Intake 714 ml  Output 750 ml  Net -36 ml    Filed Weights   11/23/20 2126 11/26/20 0500 11/27/20 0546  Weight: (!) 187.7 kg (!) 196 kg (!) 196 kg   Weight change:  Body mass index is 76.53 kg/m.   Physical Exam: General exam: Pleasant, middle-aged Caucasian female.  Morbidly obese. Skin: No rashes, lesions or ulcers. HEENT: Atraumatic, normocephalic, no obvious bleeding Lungs: Clear to auscultation bilaterally CVS: Regular rate and rhythm, no murmur GI/Abd soft, distended abdomen from obesity, nontender, bowel sounds present CNS: Alert, awake, oriented x3 Psychiatry: Mood appropriate Extremities: Chronic stasis changes  Data Review: I have personally reviewed the laboratory data and studies  available.  No results for input(s): WBC, NEUTROABS, HGB, HCT, MCV, PLT in the last 168 hours.  Recent Labs  Lab 11/26/20 0137 11/28/20 0541  NA 135  --   K 3.5  --   CL 101  --   CO2 25  --   GLUCOSE 93  --   BUN 17  --   CREATININE 1.38* 1.32*  CALCIUM 8.9  --      F/u labs ordered Unresulted Labs (From admission, onward)     Start     Ordered   11/07/20 0500  Creatinine, serum  (enoxaparin (LOVENOX)    CrCl >/= 30 ml/min)  Weekly,  R     Comments: while on enoxaparin therapy    10/31/20 1548            Signed, Terrilee Croak, MD [facility] 11/30/2020

## 2020-12-01 NOTE — Progress Notes (Signed)
Occupational Therapy Treatment Patient Details Name: Heather [name] MRN: [id number] DOB: 1975/05/12 Today's Date: 12/01/2020    History of present illness 45 y.o. female presented to ED for chest pain and palpitations and feeling like her throat was closing while at her behavioral health facility for SI. Admitted 10/31/20 for treatment of complicated UTI PMH: anxiety and depression (hx of bipolar and schizoaffective disorder in chart in past), ACD, panniculitis, debility,GERD,  OSA not on cpap, chronic back pain.   OT comments  Patient in bed and offered to get out of bed into recliner.  Patient stated she did not want to get in chair and asked to wash hair in bed.  Therapist assisted patient with washing hair while in bed with grooming.  Patient encouraged to get out of bed over weekend and increase activity level.  Follow Up Recommendations  SNF;Supervision/Assistance - 24 hour    Equipment Recommendations  3 in 1 bedside commode;Tub/shower bench    Recommendations for Other Services      Precautions / Restrictions Precautions Precautions: Fall Precaution Comments: fall prior to hospitalization Restrictions Weight Bearing Restrictions: No       Mobility Bed Mobility Overal bed mobility: Needs Assistance Bed Mobility: Rolling Rolling: Supervision         General bed mobility comments:  (used rails to roll in bed and pull self up)    Transfers                      Balance                                           ADL either performed or assessed with clinical judgement   ADL Overall ADL's : Needs assistance/impaired Eating/Feeding: Independent;Bed level   Grooming: Brushing hair;Bed level Grooming Details (indicate cue type and reason):  (Patient was total assist to wash hair at bed level and brushed hair with vcs)                                     Vision       Perception     Praxis      Cognition  Arousal/Alertness: Awake/alert Behavior During Therapy: WFL for tasks assessed/performed Overall Cognitive Status: History of cognitive impairments - at baseline Area of Impairment: Safety/judgement;Problem solving                         Safety/Judgement: Decreased awareness of deficits   Problem Solving: Requires verbal cues General Comments:  (unable to take care of herself)        Exercises     Shoulder Instructions       General Comments      Pertinent Vitals/ Pain       Pain Assessment: No/denies pain Pain Score: 0-No pain  Home Living                                          Prior Functioning/Environment              Frequency  Min 2X/week        Progress Toward Goals  OT Goals(current goals can now be found  in the care plan section)  Progress towards OT goals: Progressing toward goals  Acute Rehab OT Goals Patient Stated Goal: be able to get around on her own OT Goal Formulation: With patient Time For Goal Achievement: 12/05/20 Potential to Achieve Goals: Fair ADL Goals Pt Will Perform Grooming: with set-up;sitting Pt Will Perform Lower Body Bathing: with supervision;sit to/from stand;sitting/lateral leans;with adaptive equipment Pt Will Perform Lower Body Dressing: with supervision;sit to/from stand;sitting/lateral leans;with adaptive equipment Pt Will Transfer to Toilet: with supervision;ambulating;bedside commode Pt Will Perform Toileting - Clothing Manipulation and hygiene: with supervision;sitting/lateral leans;sit to/from stand;with adaptive equipment Pt Will Perform Tub/Shower Transfer: with supervision;tub bench;rolling walker  Plan Discharge plan remains appropriate;Frequency remains appropriate    Co-evaluation                 AM-PAC OT "6 Clicks" Daily Activity     Outcome Measure   Help from another person eating meals?: None Help from another person taking care of personal grooming?: A  Little Help from another person toileting, which includes using toliet, bedpan, or urinal?: A Lot Help from another person bathing (including washing, rinsing, drying)?: A Lot Help from another person to put on and taking off regular upper body clothing?: A Little Help from another person to put on and taking off regular lower body clothing?: A Lot 6 Click Score: 16    End of Session    OT Visit Diagnosis: Unsteadiness on feet (R26.81);Other abnormalities of gait and mobility (R26.89);Muscle weakness (generalized) (M62.81);Pain;Other (comment)   Activity Tolerance Patient tolerated treatment well   Patient Left in bed;with call bell/phone within reach   Nurse Communication          Time: [id number] OT Time Calculation (min): 38 min  Charges: OT Treatments $Self Care/Home Management : 38-52 mins  Lodema Hong, [place] 12/01/2020, 3:49 PM

## 2020-12-01 NOTE — Progress Notes (Signed)
Orthopedic Tech Progress Note Patient Details:  Heather [name] 21-Jun-1975 [id number]  Ortho Devices Type of Ortho Device: Ace wrap Ortho Device/Splint Location: Right knee Ortho Device/Splint Interventions: Application   Post Interventions Patient Tolerated: Well  Heather [name] Heather [name] 12/01/2020, 6:26 PM

## 2020-12-01 NOTE — Plan of Care (Signed)
  Problem: Health Behavior/Discharge Planning: Goal: Ability to manage health-related needs will improve Outcome: Progressing   Problem: Activity: Goal: Risk for activity intolerance will decrease Outcome: Progressing   Problem: Skin Integrity: Goal: Risk for impaired skin integrity will decrease Outcome: Progressing   

## 2020-12-01 NOTE — Progress Notes (Signed)
Physical Therapy Treatment Patient Details Name: Heather [name] MRN: [id number] DOB: 1975-09-12 Today's Date: 12/01/2020    History of Present Illness 45 y.o. female presented to ED for chest pain and palpitations and feeling like her throat was closing while at her behavioral health facility for SI. Admitted 10/31/20 for treatment of complicated UTI PMH: anxiety and depression (hx of bipolar and schizoaffective disorder in chart in past), ACD, panniculitis, debility,GERD,  OSA not on cpap, chronic back pain.    PT Comments    Pt declining out of bed mobility attempts this session. Pt participates in therapeutic exercise well, focusing on LE strength and core strength this session. Pt able to progress to bridge exercise as well as rows in bed to increase exercise difficulty. Pt will benefit from continued aggressive mobilization and PT services to improve mobility quality and reduce falls risk.   Follow Up Recommendations  SNF     Equipment Recommendations  None recommended by PT    Recommendations for Other Services       Precautions / Restrictions Precautions Precautions: Fall Precaution Comments: fall prior to hospitalization Restrictions Weight Bearing Restrictions: No    Mobility  Bed Mobility Overal bed mobility: Needs Assistance Bed Mobility: Rolling Rolling: Supervision         General bed mobility comments: pt performs supine to ling sitting with BUE support of rails and HOB elevated with [place] supervision    Transfers Overall transfer level:  (pt refuses any out of bed activity)                  Ambulation/Gait                 Stairs             Wheelchair Mobility    Modified Rankin (Stroke Patients Only)       Balance Overall balance assessment: Needs assistance Sitting-balance support: Bilateral upper extremity supported;Feet supported (in long sitting in bed) Sitting balance-Leahy Scale: Poor   Postural control:  Posterior lean                                  Cognition Arousal/Alertness: Awake/alert Behavior During Therapy: WFL for tasks assessed/performed Overall Cognitive Status: Within Functional Limits for tasks assessed Area of Impairment: Safety/judgement;Problem solving                         Safety/Judgement: Decreased awareness of deficits   Problem Solving: Requires verbal cues General Comments: WFL for purpose of session      Exercises General Exercises - Lower Extremity Ankle Circles/Pumps: AROM;Both;15 reps Short Arc Quad: AROM;Both;10 reps Heel Slides: AROM;Both;10 reps Hip ABduction/ADduction: AROM;Both;15 reps Straight Leg Raises: AROM;Both;10 reps Other Exercises Other Exercises: bridges x 10, no clearance of buttocks Other Exercises: rows from semi-fowlers into long sitting x 10    General Comments General comments (skin integrity, edema, etc.): VSS on RA      Pertinent Vitals/Pain Pain Assessment: No/denies pain Pain Score: 0-No pain    Home Living                      Prior Function            PT Goals (current goals can now be found in the care plan section) Acute Rehab PT Goals Patient Stated Goal: be able to get around on her own Progress  towards PT goals: Progressing toward goals    Frequency    Min 2X/week      PT Plan Current plan remains appropriate    Co-evaluation              AM-PAC PT "6 Clicks" Mobility   Outcome Measure  Help needed turning from your back to your side while in a flat bed without using bedrails?: A Little Help needed moving from lying on your back to sitting on the side of a flat bed without using bedrails?: A Little Help needed moving to and from a bed to a chair (including a wheelchair)?: A Little Help needed standing up from a chair using your arms (e.g., wheelchair or bedside chair)?: A Little Help needed to walk in hospital room?: A Little Help needed climbing 3-5  steps with a railing? : A Lot 6 Click Score: 17    End of Session   Activity Tolerance: Patient tolerated treatment well Patient left: in bed;with call bell/phone within reach Nurse Communication: Mobility status PT Visit Diagnosis: Unsteadiness on feet (R26.81);Muscle weakness (generalized) (M62.81);History of falling (Z91.81)     Time: [id number] PT Time Calculation (min) (ACUTE ONLY): 16 min  Charges:  $Therapeutic Exercise: 8-22 mins                     Zenaida Niece, PT, DPT Acute Rehabilitation Pager: [phone]    Zenaida Niece 12/01/2020, 5:49 PM

## 2020-12-01 NOTE — Progress Notes (Signed)
[facility] PROGRESS NOTE  Heather [name] [id number] DOB: Oct 29, 1975 DOA: 10/30/2020 PCP: Lucianne Lei, MD  Status: Remains inpatient appropriate because:Unsafe d/c plan  Dispo:  Patient From:  Homeless  Planned Disposition: [facility]  Medically stable for discharge: Yes  Barriers to discharge: Morbid obesity/disability pending    Level of care: Med-Surg  Code Status: Full Family Communication:  DVT prophylaxis: Lovenox COVID vaccination status: Moderna x1 on 11/06/2020  and Pfizer x1 on 08/28/2020    HPI: 45 y.o. female with PMH significant for morbid obesity, DM2, obstructive sleep apnea, COPD, MDD, GAD, insomnia and RLS,renal mass,  and numerous small wounds under her pannus, upper thighs and between her legs.   Patient presented to the ED on 7/25 presented with right flank and suprapubic pain along with nausea and vomiting.   She was admitted and treated for complicated UTI with antibiotics and has completed treatment.  Currently medically stable for discharge to SNF.  Subjective: Alert.  No specific complaints regarding pain or other issues.  Is very worried about what will happen to her sister who is also homeless especially if the patient gets placed outside of [place].  Complaints has had difficulty with back pain and walking since spinal for C-section.  Note patient states she is able to ambulate independently but this is contradicted by therapy notes.  Objective: Vitals:   11/30/20 2054 12/01/20 0610  BP: (!) 121/55 110/60  Pulse: 81 67  Resp: 16 16  Temp: 98.2 F (36.8 C) 98 F (36.7 C)  SpO2: 95% 98%    Intake/Output Summary (Last 24 hours) at 12/01/2020 0802 Last data filed at 11/30/2020 1700 Gross per 24 hour  Intake 600 ml  Output --  Net 600 ml   Filed Weights   11/23/20 2126 11/26/20 0500 11/27/20 0546  Weight: (!) 187.7 kg (!) 196 kg (!) 196 kg    Exam:  Constitutional: NAD, calm, comfortable Respiratory: clear  to auscultation bilaterally, no wheezing, no crackles. Normal respiratory effort. No accessory muscle use.  Cardiovascular: Regular rate and rhythm, no murmurs / rubs / gallops. No extremity edema. 2+ pedal pulses. No carotid bruits.  Abdomen: no tenderness, no masses palpated.  Obese with pannus.  Bowel sounds positive. LBM 8/22 Neurologic: CN 2-12 grossly intact. Sensation intact, DTR normal. Strength 5/5 x all 4 extremities.  Psychiatric: Normal judgment and insight. Alert and oriented x 3. Normal mood.    Assessment/Plan: Acute problems: Complicated UTI presumed 2/2 Enterobacter colonization -Patient completed 3 days of cefepime, urine culture colonized, currently off antibiotics. -Continue to monitor off antibiotics.   Nonobstructing nephrolithiasis Renal cyst -CT abdomen and pelvis from 7/26 showed bilateral nonobstructing stones including a staghorn calculus on the left.  Patient does not need any procedure at this time.  Can follow-up with urology as an outpatient. -CT scan abdomen from 7/19 also showed up 8.3 cm right upper pole renal cyst likely benign.   Suicidal ideation/anxiety and depression -Psychiatry has signed off, cleared the patient for discharge.   -Continue Wellbutrin, trazodone and gabapentin.   -Continue as needed clonazepam. Outpatient follow-up with psychiatry   CKD 3B -Used to be on indapamide in the past.  Currently on Lasix 40 mg daily.  Creatinine stable on last blood work done on 8/23. Recent Labs (within last 365 days)                  Recent Labs    10/30/20 1414 11/01/20 0350 11/02/20 0321 11/03/20 0950 11/05/20 0217  11/07/20 0521 11/11/20 0417 11/14/20 0435 11/18/20 0649 11/19/20 0501 11/21/20 0906 11/22/20 0305 11/26/20 0137 11/28/20 0541  BUN '8 13 9 8 9  '$ --  16  --  20 19  --  21* 17  --   CREATININE 1.42* 1.57* 1.46* 1.24* 1.21* 1.16* 1.22* 1.29* 1.40* 1.24* 1.40* 1.30* 1.38* 1.32*       Generalized  deconditioning Homelessness -PT/OT recommending SNF.  Patient lacks appropriate insight into degree of disability she has although she is motivated to walk even though she requires minimal assistance she has been noted to have heavy use of the rails to be able to move around the bed and sit down.  Continues to require assistance to move from seated to standing position and reports continued right knee pain and instability.  She is high risk for fall -We will ask OT to see for stabilizing brace to see if this will help with mobility -TOC following.  Will need SNF placement.   Panniculitis -Significantly improved.   Asthma -Stable, continue current inhalers.   Morbid obesity  -Body mass index is 76.53 kg/m. Patient has been advised to make an attempt to improve diet and exercise patterns to aid in weight loss.   Generalized anxiety disorder/restless legs -Continue Wellbutrin, BuSpar, Neurontin, Requip.      Data Reviewed: Basic Metabolic Panel: Recent Labs  Lab 11/26/20 0137 11/28/20 0541  NA 135  --   K 3.5  --   CL 101  --   CO2 25  --   GLUCOSE 93  --   BUN 17  --   CREATININE 1.38* 1.32*  CALCIUM 8.9  --    Liver Function Tests: No results for input(s): AST, ALT, ALKPHOS, BILITOT, PROT, ALBUMIN in the last 168 hours. No results for input(s): LIPASE, AMYLASE in the last 168 hours. No results for input(s): AMMONIA in the last 168 hours. CBC: No results for input(s): WBC, NEUTROABS, HGB, HCT, MCV, PLT in the last 168 hours. Cardiac Enzymes: No results for input(s): CKTOTAL, CKMB, CKMBINDEX, TROPONINI in the last 168 hours. BNP (last 3 results) No results for input(s): BNP in the last 8760 hours.  ProBNP (last 3 results) No results for input(s): PROBNP in the last 8760 hours.  CBG: No results for input(s): GLUCAP in the last 168 hours.  No results found for this or any previous visit (from the past 240 hour(s)).   Studies: No results found.  Scheduled Meds:   ascorbic acid  500 mg Oral Daily   buPROPion ER  200 mg Oral BID   clotrimazole   Topical BID   enoxaparin (LOVENOX) injection  100 mg Subcutaneous Q24H   fluticasone  2 spray Each Nare BID   furosemide  40 mg Oral Daily   gabapentin  200 mg Oral TID   montelukast  10 mg Oral QHS   nystatin   Topical BID   pantoprazole  40 mg Oral Daily   rOPINIRole  4 mg Oral QHS   senna-docusate  2 tablet Oral Daily   sodium chloride flush  3 mL Intravenous Q12H   traZODone  50 mg Oral QHS   Continuous Infusions:  sodium chloride      Principal Problem:   Complicated UTI (urinary tract infection) Active Problems:   Asthma   PALPITATIONS, OCCASIONAL   Panniculitis   OSA (obstructive sleep apnea)   Generalized anxiety disorder   Major depression, chronic   Thrombocytosis   Hypokalemia   CKD (chronic kidney disease), stage  III [facility])   Consultants: Psychiatry  Procedures: None  Antibiotics: Fosfomycin x1 on 7/19, Macrodantin 7/21 and 7/22 Ertapenem x1 on 7/26 Cefepime 11/02/2027   Time spent: 35 minutes    Erin Hearing ANP  [facility] 7 am - 330 pm/M-F for direct patient care and secure chat Please refer to Amion for contact info 30  days

## 2020-12-02 DIAGNOSIS — N390 Urinary tract infection, site not specified: Secondary | ICD-10-CM | POA: Diagnosis not present

## 2020-12-02 NOTE — Plan of Care (Signed)
  Problem: Activity: Goal: Risk for activity intolerance will decrease Outcome: Progressing   Problem: Skin Integrity: Goal: Risk for impaired skin integrity will decrease Outcome: Progressing   

## 2020-12-02 NOTE — Progress Notes (Signed)
PROGRESS NOTE  Heather [name]  DOB: Apr 29, 1975  PCP: Lucianne Lei, MD [id number]  [place]: 10/30/2020  LOS: 39 days  Hospital Day: 67   Chief Complaint  Patient presents with   BH/CP    Brief narrative: Heather [name] is a 45 y.o. female with PMH significant for morbid obesity, DM2, obstructive sleep apnea, COPD, MDD, GAD, insomnia and RLS,renal mass,  and numerous small wounds under her pannus, upper thighs and between her legs.   Patient presented to the ED on 7/25 presented with right flank and suprapubic pain along with nausea and vomiting.   She was admitted and treated for complicated UTI with antibiotics and has completed treatment.  Currently medically stable for discharge to SNF.  Subjective: Patient was seen and examined this morning.   Lying down in bed.  Not in distress.  No new symptoms.  Pending SNF bed availability.  Assessment/Plan: Complicated UTI -Patient completed 3 days of cefepime, urine culture colonized, currently off antibiotics. -Continue to monitor off antibiotics.   Nonobstructing nephrolithiasis Renal cyst -CT abdomen and pelvis from 7/26 showed bilateral nonobstructing stones including a staghorn calculus on the left.  Patient does not need any procedure at this time.  Can follow-up with urology as an outpatient. -CT scan abdomen from 7/19 also showed up 8.3 cm right upper pole renal cyst likely benign.   Suicidal ideation/anxiety and depression -Psychiatry has signed off, cleared the patient for discharge.   -Continue Wellbutrin, trazodone and gabapentin.   -Continue as needed clonazepam. Outpatient follow-up with psychiatry   CKD 3B -Used to be on indapamide in the past.  Currently on Lasix 40 mg daily.  Creatinine stable on last blood work done on 8/23. Recent Labs    10/30/20 1414 11/01/20 0350 11/02/20 0321 11/03/20 0950 11/05/20 0217 11/07/20 0521 11/11/20 0417 11/14/20 0435 11/18/20 0649 11/19/20 0501 11/21/20 0906  11/22/20 0305 11/26/20 0137 11/28/20 0541  BUN '8 13 9 8 9  '$ --  16  --  20 19  --  21* 17  --   CREATININE 1.42* 1.57* 1.46* 1.24* 1.21* 1.16* 1.22* 1.29* 1.40* 1.24* 1.40* 1.30* 1.38* 1.32*     Generalized deconditioning Homelessness -TOC following.  Will need SNF placement.   Panniculitis -Significantly improved.   Asthma -Stable, continue current inhalers.   Morbid obesity  -Body mass index is 76.53 kg/m. Patient has been advised to make an attempt to improve diet and exercise patterns to aid in weight loss.  Generalized anxiety disorder/restless legs -Continue Wellbutrin, BuSpar, Neurontin, Requip.   Mobility: limited mobility because of morbid obesity Code Status:   Code Status: Full Code  Nutritional status: Body mass index is 76.53 kg/m.     Diet:  Diet Order             Diet regular Room service appropriate? Yes; Fluid consistency: Thin  Diet effective now           Diet general                  DVT prophylaxis: Lovenox subcu   Antimicrobials: None Fluid: None Consultants: None Family Communication: None at bedside  Status is: Inpatient  Remains inpatient appropriate because: Pending SNF placement  Dispo: The patient is from: Homeless              Anticipated d/c is to: Pending SNF.  DTP              Patient currently is medically stable to d/c.  Difficult to place patient Yes     Infusions:   sodium chloride      Scheduled Meds:  ascorbic acid  500 mg Oral Daily   buPROPion ER  200 mg Oral BID   clotrimazole   Topical BID   enoxaparin (LOVENOX) injection  100 mg Subcutaneous Q24H   fluticasone  2 spray Each Nare BID   furosemide  40 mg Oral Daily   gabapentin  200 mg Oral TID   montelukast  10 mg Oral QHS   nystatin   Topical BID   pantoprazole  40 mg Oral Daily   rOPINIRole  4 mg Oral QHS   senna-docusate  2 tablet Oral Daily   sodium chloride flush  3 mL Intravenous Q12H   traZODone  50 mg Oral QHS     Antimicrobials: Anti-infectives (From admission, onward)    Start     Dose/Rate Route Frequency Ordered Stop   11/01/20 1000  ceFEPIme (MAXIPIME) 2 g in sodium chloride 0.9 % 100 mL IVPB        2 g 200 mL/hr over 30 Minutes Intravenous Every 12 hours 10/31/20 1550 11/03/20 2309   11/01/20 0600  ceFEPIme (MAXIPIME) 2 g in sodium chloride 0.9 % 100 mL IVPB  Status:  Discontinued        2 g 200 mL/hr over 30 Minutes Intravenous Every 8 hours 10/31/20 1541 10/31/20 1550   10/31/20 1000  ertapenem (INVANZ) 1,000 mg in sodium chloride 0.9 % 100 mL IVPB  Status:  Discontinued        1 g 200 mL/hr over 30 Minutes Intravenous Every 24 hours 10/31/20 0948 10/31/20 1541       PRN meds: sodium chloride, acetaminophen **OR** acetaminophen, albuterol, clonazepam, ipratropium-albuterol, nitroGLYCERIN, ondansetron **OR** ondansetron (ZOFRAN) IV, senna-docusate, sodium chloride flush, traMADol, witch hazel-glycerin   Objective: Vitals:   12/02/20 0456 12/02/20 0947  BP: (!) 117/56 (!) 113/57  Pulse: 70 73  Resp: 16 18  Temp: 98 F (36.7 C) 98.2 F (36.8 C)  SpO2: 97% 96%    Intake/Output Summary (Last 24 hours) at 12/02/2020 1155 Last data filed at 12/01/2020 1900 Gross per 24 hour  Intake 840 ml  Output --  Net 840 ml    Filed Weights   11/23/20 2126 11/26/20 0500 11/27/20 0546  Weight: (!) 187.7 kg (!) 196 kg (!) 196 kg   Weight change:  Body mass index is 76.53 kg/m.   Physical Exam: General exam: Pleasant, middle-aged Caucasian female.  Morbidly obese. Skin: No rashes, lesions or ulcers. HEENT: Atraumatic, normocephalic, no obvious bleeding Lungs: Clear to auscultation bilaterally CVS: Regular rate and rhythm, no murmur GI/Abd soft, distended abdomen from obesity, nontender, bowel sounds present CNS: Alert, awake, oriented x3 Psychiatry: Mood appropriate Extremities: Chronic stasis changes  Data Review: I have personally reviewed the laboratory data and studies  available.  No results for input(s): WBC, NEUTROABS, HGB, HCT, MCV, PLT in the last 168 hours.  Recent Labs  Lab 11/26/20 0137 11/28/20 0541  NA 135  --   K 3.5  --   CL 101  --   CO2 25  --   GLUCOSE 93  --   BUN 17  --   CREATININE 1.38* 1.32*  CALCIUM 8.9  --      F/u labs ordered Unresulted Labs (From admission, onward)     Start     Ordered   11/07/20 0500  Creatinine, serum  (enoxaparin (LOVENOX)    CrCl >/= 30  ml/min)  Weekly,   R     Comments: while on enoxaparin therapy    10/31/20 1548            Signed, Terrilee Croak, MD [facility] 12/02/2020

## 2020-12-03 NOTE — Progress Notes (Signed)
PROGRESS NOTE  Heather [name]  DOB: 10/09/1975  PCP: Lucianne Lei, MD [id number]  [place]: 10/30/2020  LOS: 66 days  Hospital Day: 32   Chief Complaint  Patient presents with   BH/CP    Brief narrative: Heather [name] is a 45 y.o. female with PMH significant for morbid obesity, DM2, obstructive sleep apnea, COPD, MDD, GAD, insomnia and RLS,renal mass,  and numerous small wounds under her pannus, upper thighs and between her legs.   Patient presented to the ED on 7/25 presented with right flank and suprapubic pain along with nausea and vomiting.   She was admitted and treated for complicated UTI with antibiotics and has completed treatment.  Currently medically stable for discharge to SNF.  Subjective: Patient was seen and examined this morning.   Morbidly obese Caucasian woman.  Lying on bed.  Not in distress.  No new symptoms.  Assessment/Plan: Complicated UTI -Patient completed 3 days of cefepime, urine culture colonized, currently off antibiotics. -Continue to monitor off antibiotics.   Nonobstructing nephrolithiasis Renal cyst -CT abdomen and pelvis from 7/26 showed bilateral nonobstructing stones including a staghorn calculus on the left.  Patient does not need any procedure at this time.  Can follow-up with urology as an outpatient. -CT scan abdomen from 7/19 also showed up 8.3 cm right upper pole renal cyst likely benign.   Suicidal ideation/anxiety and depression -Psychiatry has signed off, cleared the patient for discharge.   -Continue Wellbutrin, trazodone and gabapentin.   -Continue as needed clonazepam. Outpatient follow-up with psychiatry   CKD 3B -Used to be on indapamide in the past.  Currently on Lasix 40 mg daily.  Creatinine stable on last blood work done on 8/23. Recent Labs    10/30/20 1414 11/01/20 0350 11/02/20 0321 11/03/20 0950 11/05/20 0217 11/07/20 0521 11/11/20 0417 11/14/20 0435 11/18/20 0649 11/19/20 0501 11/21/20 0906  11/22/20 0305 11/26/20 0137 11/28/20 0541  BUN '8 13 9 8 9  '$ --  16  --  20 19  --  21* 17  --   CREATININE 1.42* 1.57* 1.46* 1.24* 1.21* 1.16* 1.22* 1.29* 1.40* 1.24* 1.40* 1.30* 1.38* 1.32*     Generalized deconditioning Homelessness -TOC following.  Will need SNF placement.   Panniculitis -Significantly improved.   Asthma -Stable, continue current inhalers.   Morbid obesity  -Body mass index is 75.76 kg/m. Patient has been advised to make an attempt to improve diet and exercise patterns to aid in weight loss.  Generalized anxiety disorder/restless legs -Continue Wellbutrin, BuSpar, Neurontin, Requip.   Mobility: limited mobility because of morbid obesity Code Status:   Code Status: Full Code  Nutritional status: Body mass index is 75.76 kg/m.     Diet:  Diet Order             Diet regular Room service appropriate? Yes; Fluid consistency: Thin  Diet effective now           Diet general                  DVT prophylaxis: Lovenox subcu   Antimicrobials: None Fluid: None Consultants: None Family Communication: None at bedside  Status is: Inpatient  Remains inpatient appropriate because: Pending SNF placement  Dispo: The patient is from: Homeless              Anticipated d/c is to: Pending SNF.  DTP              Patient currently is medically stable to d/c.  Difficult to place patient Yes     Infusions:   sodium chloride      Scheduled Meds:  ascorbic acid  500 mg Oral Daily   buPROPion ER  200 mg Oral BID   clotrimazole   Topical BID   enoxaparin (LOVENOX) injection  100 mg Subcutaneous Q24H   fluticasone  2 spray Each Nare BID   furosemide  40 mg Oral Daily   gabapentin  200 mg Oral TID   montelukast  10 mg Oral QHS   nystatin   Topical BID   pantoprazole  40 mg Oral Daily   rOPINIRole  4 mg Oral QHS   senna-docusate  2 tablet Oral Daily   sodium chloride flush  3 mL Intravenous Q12H   traZODone  50 mg Oral QHS     Antimicrobials: Anti-infectives (From admission, onward)    Start     Dose/Rate Route Frequency Ordered Stop   11/01/20 1000  ceFEPIme (MAXIPIME) 2 g in sodium chloride 0.9 % 100 mL IVPB        2 g 200 mL/hr over 30 Minutes Intravenous Every 12 hours 10/31/20 1550 11/03/20 2309   11/01/20 0600  ceFEPIme (MAXIPIME) 2 g in sodium chloride 0.9 % 100 mL IVPB  Status:  Discontinued        2 g 200 mL/hr over 30 Minutes Intravenous Every 8 hours 10/31/20 1541 10/31/20 1550   10/31/20 1000  ertapenem (INVANZ) 1,000 mg in sodium chloride 0.9 % 100 mL IVPB  Status:  Discontinued        1 g 200 mL/hr over 30 Minutes Intravenous Every 24 hours 10/31/20 0948 10/31/20 1541       PRN meds: sodium chloride, acetaminophen **OR** acetaminophen, albuterol, clonazepam, ipratropium-albuterol, nitroGLYCERIN, ondansetron **OR** ondansetron (ZOFRAN) IV, senna-docusate, sodium chloride flush, traMADol, witch hazel-glycerin   Objective: Vitals:   12/03/20 0600 12/03/20 0940  BP: (!) 98/55 (!) 126/58  Pulse: 73 75  Resp: 18 18  Temp: 98.5 F (36.9 C) 98.2 F (36.8 C)  SpO2: 96% 98%    Intake/Output Summary (Last 24 hours) at 12/03/2020 1150 Last data filed at 12/03/2020 0900 Gross per 24 hour  Intake 720 ml  Output 0 ml  Net 720 ml    Filed Weights   11/26/20 0500 11/27/20 0546 12/03/20 0600  Weight: (!) 196 kg (!) 196 kg (!) 194 kg   Weight change:  Body mass index is 75.76 kg/m.   Physical Exam: General exam: Pleasant, middle-aged Caucasian female.  Morbidly obese. Skin: No rashes, lesions or ulcers. HEENT: Atraumatic, normocephalic, no obvious bleeding Lungs: Clear to auscultation bilaterally CVS: Regular rate and rhythm, no murmur GI/Abd soft, distended abdomen from obesity, nontender, bowel sounds present CNS: Alert, awake, oriented x3 Psychiatry: Mood appropriate Extremities: Chronic stasis changes  Data Review: I have personally reviewed the laboratory data and studies  available.  No results for input(s): WBC, NEUTROABS, HGB, HCT, MCV, PLT in the last 168 hours.  Recent Labs  Lab 11/28/20 0541  CREATININE 1.32*     F/u labs ordered Unresulted Labs (From admission, onward)     Start     Ordered   11/07/20 0500  Creatinine, serum  (enoxaparin (LOVENOX)    CrCl >/= 30 ml/min)  Weekly,   R     Comments: while on enoxaparin therapy    10/31/20 1548            Signed, Terrilee Croak, MD [facility] 12/03/2020

## 2020-12-04 ENCOUNTER — Encounter (HOSPITAL_COMMUNITY): Payer: Self-pay

## 2020-12-04 ENCOUNTER — Telehealth (INDEPENDENT_AMBULATORY_CARE_PROVIDER_SITE_OTHER): Payer: Medicaid Other | Admitting: Psychiatry

## 2020-12-04 DIAGNOSIS — F4310 Post-traumatic stress disorder, unspecified: Secondary | ICD-10-CM

## 2020-12-04 DIAGNOSIS — F331 Major depressive disorder, recurrent, moderate: Secondary | ICD-10-CM

## 2020-12-04 MED ORDER — NYSTATIN 100000 UNIT/GM EX POWD
Freq: Three times a day (TID) | CUTANEOUS | Status: DC
  Filled 2020-12-04 (×7): qty 15

## 2020-12-04 NOTE — Progress Notes (Signed)
[facility] PROGRESS NOTE  Heather [name] [id number] DOB: 1975-06-17 DOA: 10/30/2020 PCP: Lucianne Lei, MD  Status: Remains inpatient appropriate because:Unsafe d/c plan  Dispo:  Patient From:  Homeless  Planned Disposition: [place]  Medically stable for discharge: Yes  Barriers to discharge: Morbid obesity/disability pending    Level of care: Med-Surg  Code Status: Full Family Communication:  DVT prophylaxis: Lovenox COVID vaccination status: Moderna x1 on 11/06/2020  and Pfizer x1 on 08/28/2020    HPI: 45 y.o. female with PMH significant for morbid obesity, DM2, obstructive sleep apnea, COPD, MDD, GAD, insomnia and RLS,renal mass,  and numerous small wounds under her pannus, upper thighs and between her legs.   Patient presented to the ED on 7/25 presented with right flank and suprapubic pain along with nausea and vomiting.   She was admitted and treated for complicated UTI with antibiotics and has completed treatment.  Currently medically stable for discharge to SNF.  Subjective: Awake and alert.  States with maximum assistance was able to ambulate to shower and take a shower yesterday.  Was told by staff she needs a minimum shower 3 times per week.  She also had developed some blisters in her intertriginous areas on her right lateral abdomen underneath pannus folds-on exam today these blisters were not present but the skin looks consistent with Candida  Objective: Vitals:   12/04/20 0532 12/04/20 0537  BP: 113/63 121/72  Pulse: 69 74  Resp: 16   Temp: 98 F (36.7 C) 97.7 F (36.5 C)  SpO2: 100% 100%    Intake/Output Summary (Last 24 hours) at 12/04/2020 0836 Last data filed at 12/03/2020 2200 Gross per 24 hour  Intake 840 ml  Output 950 ml  Net -110 ml   Filed Weights   11/26/20 0500 11/27/20 0546 12/03/20 0600  Weight: (!) 196 kg (!) 196 kg (!) 194 kg    Exam:  Constitutional: NAD, calm, comfortable Respiratory: Lungs are  clear to auscultation, she is stable on room air while lying supine in bed Cardiovascular: S1-S2, regular pulse, soft nonpitting bilateral peripheral edema. Abdomen: no tenderness, no masses palpated.  Obese with pannus.  Bowel sounds positive. LBM 8/22 Skin: Patient has reddening and moisture trapping underneath skin folds of pannus as well as lateral skin folds Neurologic: CN 2-12 grossly intact. Sensation intact, Strength 3+-4/5 x all 4 extremities.  Psychiatric: Normal judgment and insight. Alert and oriented x 3. Normal mood.    Assessment/Plan: Acute problems: Complicated UTI presumed 2/2 Enterobacter colonization -Patient completed 3 days of cefepime, urine culture colonized, currently off antibiotics. -Continue to monitor off antibiotics.   Nonobstructing nephrolithiasis Renal cyst -CT abdomen and pelvis from 7/26 showed bilateral nonobstructing stones including a staghorn calculus on the left.  Patient does not need any procedure at this time.  Can follow-up with urology as an outpatient. -CT scan abdomen from 7/19 also showed up 8.3 cm right upper pole renal cyst likely benign.   Suicidal ideation/anxiety and depression -Psychiatry has signed off, cleared the patient for discharge.   -Continue Wellbutrin, trazodone and gabapentin.   -Continue as needed clonazepam. Outpatient follow-up with psychiatry   CKD 3B -Used to be on indapamide in the past.  Currently on Lasix 40 mg daily.  Creatinine stable on last blood work done on 8/23. Recent Labs (within last 365 days)                  Recent Labs    10/30/20 1414 11/01/20 0350 11/02/20  0321 11/03/20 0950 11/05/20 0217 11/07/20 0521 11/11/20 0417 11/14/20 0435 11/18/20 0649 11/19/20 0501 11/21/20 0906 11/22/20 0305 11/26/20 0137 11/28/20 0541  BUN '8 13 9 8 9  '$ --  16  --  20 19  --  21* 17  --   CREATININE 1.42* 1.57* 1.46* 1.24* 1.21* 1.16* 1.22* 1.29* 1.40* 1.24* 1.40* 1.30* 1.38* 1.32*       Generalized  deconditioning Homelessness -PT/OT recommending SNF.  Patient lacks appropriate insight into degree of disability she has although she is motivated to walk even though she requires minimal assistance she has been noted to have heavy use of the rails to be able to move around the bed and sit down.  Continues to require assistance to move from seated to standing position and reports continued right knee pain and instability.  She is high risk for fall -We will ask OT to see for stabilizing brace to see if this will help with mobility -TOC following.  Will need SNF placement.   Yeast panniculitis -Significantly improved. -Increase frequency of nystatin powder to all of intertriginous areas (TID) -Instructions given for staff to wash all intertriginous areas with water and pat dry before applying a light dusting of the nystatin powder   Asthma -Stable, continue current inhalers.   Morbid obesity  -Body mass index is 76.53 kg/m. Patient has been advised to make an attempt to improve diet and exercise patterns to aid in weight loss.   Generalized anxiety disorder/restless legs -Continue Wellbutrin, BuSpar, Neurontin, Requip.      Data Reviewed: Basic Metabolic Panel: Recent Labs  Lab 11/28/20 0541  CREATININE 1.32*   Liver Function Tests: No results for input(s): AST, ALT, ALKPHOS, BILITOT, PROT, ALBUMIN in the last 168 hours. No results for input(s): LIPASE, AMYLASE in the last 168 hours. No results for input(s): AMMONIA in the last 168 hours. CBC: No results for input(s): WBC, NEUTROABS, HGB, HCT, MCV, PLT in the last 168 hours. Cardiac Enzymes: No results for input(s): CKTOTAL, CKMB, CKMBINDEX, TROPONINI in the last 168 hours. BNP (last 3 results) No results for input(s): BNP in the last 8760 hours.  ProBNP (last 3 results) No results for input(s): PROBNP in the last 8760 hours.  CBG: No results for input(s): GLUCAP in the last 168 hours.  No results found for this or  any previous visit (from the past 240 hour(s)).   Studies: No results found.  Scheduled Meds:  ascorbic acid  500 mg Oral Daily   buPROPion ER  200 mg Oral BID   clotrimazole   Topical BID   enoxaparin (LOVENOX) injection  100 mg Subcutaneous Q24H   fluticasone  2 spray Each Nare BID   furosemide  40 mg Oral Daily   gabapentin  200 mg Oral TID   montelukast  10 mg Oral QHS   nystatin   Topical BID   pantoprazole  40 mg Oral Daily   rOPINIRole  4 mg Oral QHS   senna-docusate  2 tablet Oral Daily   sodium chloride flush  3 mL Intravenous Q12H   traZODone  50 mg Oral QHS   Continuous Infusions:  sodium chloride      Principal Problem:   Complicated UTI (urinary tract infection) Active Problems:   Asthma   PALPITATIONS, OCCASIONAL   Panniculitis   OSA (obstructive sleep apnea)   Generalized anxiety disorder   Major depression, chronic   Thrombocytosis   Hypokalemia   CKD (chronic kidney disease), stage III ([place])   Consultants:  Psychiatry  Procedures: None  Antibiotics: Fosfomycin x1 on 7/19, Macrodantin 7/21 and 7/22 Ertapenem x1 on 7/26 Cefepime 11/02/2027   Time spent: 35 minutes    Erin Hearing ANP  [facility] 7 am - 330 pm/M-F for direct patient care and secure chat Please refer to Amion for contact info 33  days

## 2020-12-04 NOTE — Progress Notes (Signed)
Psychiatric Progress Note   Patient Identification: Heather [name] MRN:  [id number] Date of Evaluation:  12/04/2020 Referral Source: self Chief Complaint:   Chief Complaint   Anxiety; Follow-up; Depression    Visit Diagnosis:    ICD-10-CM   1. PTSD (post-traumatic stress disorder)  F43.10     2. Major depressive disorder, recurrent episode, moderate (HCC)  F33.1       History of Present Illness:   45 y.o. female diagnosed with major depressive disorder presenting with depression related to multiple stressors.  Moderate depression related to her health and losing custody of her twin daughters.  Anxiety at times, none at this time.  No suicidal ideations, hallucinations, or substance use.  She discusses her children and how they are improving developmentally.  Concerned about their appetites.  She is upset about being accused of neglecting her children as she was working from home and trying to care for them while she was sick.  Encouragement provided for her physical health and concentrating on things she can control.  Remains in the hospital awaiting rehab placement.  Past Psychiatric History: depression, PTSD, anxiety  Previous Psychotropic Medications: Yes   Substance Abuse History in the last 12 months:  No.  Consequences of Substance Abuse: NA  Past Medical History:  Past Medical History:  Diagnosis Date   Allergy    Amenorrhea    Anemia    post partum    Anxiety    Anxiety    Arthritis    Asthma    Back pain    Constipation    COPD (chronic obstructive pulmonary disease) ([place])    Delivery with history of C-section    Depression    Depression    Diabetes mellitus without complication ([place])    patient denies but states she has hyperglycemia-diet controlled   Dysmenorrhea    Dysrhythmia    DR Johnsie Cancel     Ectopic pregnancy 2013   Edema, lower extremity    Eosinophilic esophagitis    Diagnosed at [facility]/16/2013, untreated   Gallbladder problem    GERD  (gastroesophageal reflux disease)    HEARTBURN   TUMS   Hard to intubate 11/07/2015   High cholesterol    IBS (irritable bowel syndrome)    Leukocytosis 07/28/2008   Qualifier: Diagnosis of  By: Jonna Munro MD, Cornelius     Morbid obesity [facility])    Neuromuscular disorder ([place])    RESTLESS LEG    Obesity    Schizoaffective disorder, bipolar type ([place])    Sepsis ([place]) 11/11/2014   Shortness of breath    WITH EXERTION    Sleep apnea    CPAP- in process of restarting     Past Surgical History:  Procedure Laterality Date   BIOPSY  08/13/2018   Procedure: BIOPSY;  Surgeon: Yetta Flock, MD;  Location: [facility];  Service: Gastroenterology;;   CESAREAN SECTION MULTI-GESTATIONAL N/A 02/03/2017   Procedure: CESAREAN SECTION MULTI-GESTATIONAL;  Surgeon: Jonnie Kind, MD;  Location: [place];  Service: Obstetrics;  Laterality: N/A;   CHOLECYSTECTOMY     COLONOSCOPY WITH PROPOFOL N/A 08/13/2018   Procedure: COLONOSCOPY WITH PROPOFOL;  Surgeon: Yetta Flock, MD;  Location: [facility];  Service: Gastroenterology;  Laterality: N/A;   DENTAL SURGERY     ESOPHAGOGASTRODUODENOSCOPY  May 2007   Dr. Gala Romney: Normal esophagus, stomach, D1, D2   ESOPHAGOGASTRODUODENOSCOPY  06/16/2013   Dr. Carlton Adam, eosinophilic esophagitis, reactive gastropathy, no esophageal dilation   ESOPHAGOGASTRODUODENOSCOPY (EGD) WITH PROPOFOL N/A  08/13/2018   Procedure: ESOPHAGOGASTRODUODENOSCOPY (EGD) WITH PROPOFOL;  Surgeon: Yetta Flock, MD;  Location: [facility];  Service: Gastroenterology;  Laterality: N/A;   POLYPECTOMY  08/13/2018   Procedure: POLYPECTOMY;  Surgeon: Yetta Flock, MD;  Location: [facility];  Service: Gastroenterology;;   TONSILLECTOMY     TOOTH EXTRACTION  10/28/2011   Procedure: DENTAL RESTORATION/EXTRACTIONS;  Surgeon: Gae Bon, DDS;  Location[facility];  Service: Oral Surgery;;   UPPER GASTROINTESTINAL ENDOSCOPY      Family Psychiatric History: see  below  Family History:  Family History  Problem Relation Age of Onset   Depression Mother    Anxiety disorder Mother    High blood pressure Mother    Bipolar disorder Mother    Eating disorder Mother    Obesity Mother    Hypertension Sister    Allergic rhinitis Sister    Colon polyps Maternal Grandmother        32s   Diabetes Maternal Grandmother    Anxiety disorder Maternal Grandmother    COPD Maternal Grandmother    Crohn's disease Maternal Aunt    Cancer Maternal Grandfather        prostate   HIV/AIDS Father    Eating disorder Father    Obesity Father    Liver disease Neg Hx    Angioedema Neg Hx    Eczema Neg Hx    Immunodeficiency Neg Hx    Asthma Neg Hx    Urticaria Neg Hx    Colon cancer Neg Hx    Esophageal cancer Neg Hx    Rectal cancer Neg Hx    Stomach cancer Neg Hx     Social History:   Social History   Socioeconomic History   Marital status: Legally Separated    Spouse name: Gwyndolyn Saxon   Number of children: 0   Years of education: Not on file   Highest education level: Not on file  Occupational History   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 had a pack the other day  Other Topics Concern   Not on file  Social History Narrative   Not on file   Social Determinants of Health   Financial Resource Strain: Not on file  Food Insecurity: Not on file  Transportation Needs: Not on file  Physical Activity: Not on file  Stress: Not on file  Social Connections: Not on file    Additional Social History: lives with her 43 yo twins and her biological  mother lives with them, separated from husband as of last summer.  Allergies:   Allergies  Allergen Reactions   Amoxicillin Anaphylaxis   Bee Venom Shortness Of Breath   Penicillins Anaphylaxis    Tolerates cephalexin and ceftriaxone Has patient had a PCN reaction causing immediate rash, facial/tongue/throat swelling, SOB or lightheadedness with hypotension: No Has patient had a PCN reaction causing severe rash involving mucus membranes or skin necrosis: No Has patient had a PCN reaction that required hospitalization No  Has patient had a PCN reaction occurring within the last 10 years: No If all of the above answers are "NO", then may proceed with Cephalosporin use.'  REACTION: Angioedema   Penicillin G    Adhesive [Tape] Rash   Latex Rash   Vancomycin Other (See Comments)    Pt can tolerate Vancomycin but did cause Vancomycin Infusion Reaction.  Recommend to pre-medicate with Benadryl before doses administered.      Metabolic Disorder Labs: Lab Results  Component Value Date   HGBA1C 5.6 08/10/2020   MPG 114.02 08/10/2020   MPG 100 11/28/2016   No results found for: PROLACTIN Lab Results  Component Value Date   CHOL 115 06/11/2018   TRIG 120 06/11/2018   HDL 43 06/11/2018   CHOLHDL 3.9 Ratio 06/30/2008   VLDL 52 (H) 06/30/2008   LDLCALC 48 06/11/2018   LDLCALC 54 06/30/2008   Lab Results  Component Value Date   TSH 2.220 06/11/2018    Therapeutic Level Labs: Lab Results  Component Value Date   LITHIUM 0.47 (L) 06/30/2008   No results found for: CBMZ No results found for: VALPROATE  Current Medications: No current facility-administered medications for this visit.   Current Outpatient Medications  Medication Sig Dispense Refill   clotrimazole (LOTRIMIN) 1 % cream Apply topically 2 (two) times daily. 30 g 0   gabapentin (NEURONTIN) 100 MG capsule Take 2 capsules (200 mg total) by mouth 3 (three) times daily.     traMADol (ULTRAM) 50 MG tablet Take 1 tablet  (50 mg total) by mouth every 6 (six) hours as needed for severe pain. 10 tablet 0   traZODone (DESYREL) 50 MG tablet Take 1 tablet (50 mg total) by mouth at bedtime.     Facility-Administered Medications Ordered in Other Visits  Medication Dose Route Frequency Provider Last Rate Last Admin   0.9 %  sodium chloride infusion  250 mL Intravenous PRN Orma Flaming, MD       acetaminophen (TYLENOL) tablet 650 mg  650 mg Oral Q6H PRN Orma Flaming, MD   650 mg at 12/04/20 0017   Or   acetaminophen (TYLENOL) suppository 650 mg  650 mg Rectal Q6H PRN Orma Flaming, MD       albuterol (VENTOLIN HFA) 108 (90 Base) MCG/ACT inhaler 2 puff  2 puff Inhalation Q4H PRN Orma Flaming, MD   2 puff at 11/11/20 1842   ascorbic acid (VITAMIN C) tablet 500 mg  500 mg Oral Daily Orma Flaming, MD   500 mg at 12/04/20 [id number]   buPROPion ER [facility] SR) 12 hr tablet 200 mg  200 mg Oral BID Orma Flaming, MD   200 mg at 12/04/20 [id number]   clonazePAM (KLONOPIN) disintegrating tablet 0.25 mg  0.25 mg Oral BID PRN Debbe Odea, MD   0.25 mg at 12/03/20 1409   clotrimazole (LOTRIMIN) 1 % cream   Topical BID Orma Flaming, MD   Given at 12/04/20 0841   enoxaparin (LOVENOX) injection 100 mg  100 mg Subcutaneous Q24H Orma Flaming, MD   100 mg at 12/04/20 0835   fluticasone (FLONASE) 50 MCG/ACT nasal spray 2 spray  2 spray Each Nare BID Orma Flaming, MD   2 spray at 12/04/20 0839   furosemide (LASIX) tablet 40 mg  40 mg Oral Daily Aline August, MD   40 mg at 12/04/20 [id number]   gabapentin (NEURONTIN) capsule 200 mg  200 mg Oral TID Damita Dunnings B, MD   200 mg at 12/04/20 [id number]   ipratropium-albuterol (  DUONEB) 0.5-2.5 (3) MG/3ML nebulizer solution 3 mL  3 mL Nebulization Q4H PRN Starla Link, Kshitiz, MD   3 mL at 12/02/20 1654   montelukast (SINGULAIR) tablet 10 mg  10 mg Oral QHS Orma Flaming, MD   10 mg at 12/03/20 2130   nitroGLYCERIN (NITROSTAT) SL tablet 0.4 mg  0.4 mg Sublingual Q5 min PRN Shelly Coss, MD   0.4 mg at  11/11/20 1854   nystatin (MYCOSTATIN/NYSTOP) topical powder   Topical TID Samella Parr, NP   Given at 12/04/20 1224   ondansetron (ZOFRAN) tablet 4 mg  4 mg Oral Q6H PRN Orma Flaming, MD   4 mg at 11/22/20 0951   Or   ondansetron (ZOFRAN) injection 4 mg  4 mg Intravenous Q6H PRN Orma Flaming, MD   4 mg at 11/03/20 0843   pantoprazole (PROTONIX) EC tablet 40 mg  40 mg Oral Daily Shelly Coss, MD   40 mg at 12/04/20 [id number]   rOPINIRole (REQUIP) tablet 4 mg  4 mg Oral QHS Orma Flaming, MD   4 mg at 12/03/20 2130   senna-docusate (Senokot-S) tablet 1 tablet  1 tablet Oral QHS PRN Orma Flaming, MD       senna-docusate (Senokot-S) tablet 2 tablet  2 tablet Oral Daily Orma Flaming, MD   2 tablet at 12/04/20 [id number]   sodium chloride flush (NS) 0.9 % injection 3 mL  3 mL Intravenous Q12H Orma Flaming, MD   3 mL at 12/04/20 0845   sodium chloride flush (NS) 0.9 % injection 3 mL  3 mL Intravenous PRN Orma Flaming, MD   3 mL at 11/29/20 2252   traMADol (ULTRAM) tablet 50 mg  50 mg Oral Q8H PRN Orma Flaming, MD   50 mg at 12/04/20 0017   traZODone (DESYREL) tablet 50 mg  50 mg Oral QHS Damita Dunnings B, MD   50 mg at 12/03/20 2130   witch hazel-glycerin (TUCKS) pad   Topical PRN Shelly Coss, MD        Musculoskeletal: Strength & Muscle Tone: within normal limits Gait & Station: normal Patient leans: N/A  Psychiatric Specialty Exam: Review of Systems  Musculoskeletal:  Positive for back pain and myalgias.  Psychiatric/Behavioral:  Positive for dysphoric mood.   All other systems reviewed and are negative.  There were no vitals taken for this visit.There is no height or weight on file to calculate BMI.  General Appearance: Casual  Eye Contact:  Good  Speech:  Normal Rate  Volume:  Normal  Mood:  Anxious and Depressed  Affect:  Appropriate and Congruent  Thought Process:  Coherent and Descriptions of Associations: Intact  Orientation:  Full (Time, Place, and Person)  Thought  Content:  WDL and Logical  Suicidal Thoughts:  No  Homicidal Thoughts:  No  Memory:  Immediate;   Good Recent;   Good Remote;   Good  Judgement:  Fair  Insight:  Fair  Psychomotor Activity:  Normal  Concentration:  Concentration: Good and Attention Span: Good  Recall:  Good  Fund of Knowledge:Good  Language: Good  Akathisia:  No  Handed:  Right  AIMS (if indicated):  NA  Assets:  Communication Skills Leisure Time Physical Health Resilience Social Support  ADL's:  Intact  Cognition: WNL  Sleep:  Good   Screenings: PHQ2-9    Flowsheet Row Video Visit from 07/10/2020 in [facility] Counselor from 05/26/2020 in [facility] Counselor from 05/05/2020 in [facility]  Office Visit from 06/11/2018 in [place] Office Visit from 01/01/2017 in [facility]  PHQ-2 Total Score 0 '3 2 4 '$ 0  PHQ-9 Total Score 0 '6 10 17 '$ --      Flowsheet Row ED to Hosp-Admission (Current) from 10/30/2020 in [facility] Most recent reading at 11/01/2020 12:03 AM ED from 10/30/2020 in [facility] Most recent reading at 10/30/2020  1:09 PM ED from 10/26/2020 in [place] DEPT Most recent reading at 10/27/2020 10:29 AM  C-SSRS RISK CATEGORY Error: Q7 should not be populated when Q6 is No High Risk Low Risk       Assessment and Plan:  Major depressive disorder, recurrent, moderate: -Continue Wellbutrin XR 200 mg BID -Reconnect with therapy -Return for follow up in 4 weeks  General anxiety disorder: -Continue gabapentin 200 mg TID  Insomnia: -Continue Trazodone 50 mg daily at bedtime  Virtual Visit via Video Note  I connected with Heather [name] on 12/04/20 at  3:00 PM EDT by a video enabled telemedicine application and verified that I am speaking with the correct person using two  identifiers.  Location: Patient: home Provider: home office   I discussed the limitations of evaluation and management by telemedicine and the availability of in person appointments. The patient expressed understanding and agreed to proceed.  Follow Up Instructions: Follow up in two months   I discussed the assessment and treatment plan with the patient. The patient was provided an opportunity to ask questions and all were answered. The patient agreed with the plan and demonstrated an understanding of the instructions.   The patient was advised to call back or seek an in-person evaluation if the symptoms worsen or if the condition fails to improve as anticipated.  I provided 20 minutes of non-face-to-face time during this encounter.   Waylan Boga, NP    Waylan Boga, NP 8/29/20223:14 PM

## 2020-12-05 DIAGNOSIS — N390 Urinary tract infection, site not specified: Secondary | ICD-10-CM | POA: Diagnosis not present

## 2020-12-05 LAB — CREATININE, SERUM
Creatinine, Ser: 1.25 mg/dL — ABNORMAL HIGH (ref 0.44–1.00)
GFR, Estimated: 55 mL/min — ABNORMAL LOW (ref >60)

## 2020-12-05 NOTE — Progress Notes (Signed)
Occupational Therapy Treatment Patient Details Name: Heather [name] MRN: [id number] DOB: August 01, 1975 Today's Date: 12/05/2020    History of present illness 45 y.o. female presented to ED for chest pain and palpitations and feeling like her throat was closing while at her behavioral health facility for SI. Admitted 10/31/20 for treatment of complicated UTI PMH: anxiety and depression (hx of bipolar and schizoaffective disorder in chart in past), ACD, panniculitis, debility,GERD,  OSA not on cpap, chronic back pain.   OT comments  Patient met lying supine in bed. Patient declining ADLs including grooming and bathing at shower level. Session with focus on functional mobility up to 105f with 1 seated rest break, bariatric RW and Min guard. Patient declined sitting in recliner reporting chair is uncomfortable. Session concluded with patient lying supine in bed with call bell within reach and all needs met.   Follow Up Recommendations  SNF;Supervision/Assistance - 24 hour    Equipment Recommendations  3 in 1 bedside commode;Tub/shower bench;Other (comment) (Bariatric DME as listed above if not d/c'd to SNF)    Recommendations for Other Services      Precautions / Restrictions Precautions Precautions: Fall Precaution Comments: fell prior to hosp Required Braces or Orthoses:  (discussion about a knee brace in sticky note) Restrictions Weight Bearing Restrictions: No       Mobility Bed Mobility Overal bed mobility: Needs Assistance Bed Mobility: Rolling;Supine to Sit;Sit to Supine Rolling: Supervision   Supine to sit: Supervision;HOB elevated Sit to supine: Min guard   General bed mobility comments: help to return legs to bed, pt is letting PT initiate all transitions    Transfers Overall transfer level: Needs assistance Equipment used: Rolling walker (2 wheeled);1 person hand held assist Transfers: Sit to/from Stand Sit to Stand: Min guard;Min assist         General  transfer comment: Min guard for sit to stand from EOB and from recliner.    Balance Overall balance assessment: Needs assistance Sitting-balance support: Feet supported;Bilateral upper extremity supported Sitting balance-Leahy Scale: Fair     Standing balance support: Bilateral upper extremity supported Standing balance-Leahy Scale: Fair Standing balance comment: Reliant on BUE on RW with functional mobility                           ADL either performed or assessed with clinical judgement   ADL Overall ADL's : Needs assistance/impaired                         Toilet Transfer: Min gFish farm managerDetails (indicate cue type and reason): Simualted with functional mobility 10+ ft to recliner.                 Vision       Perception     Praxis      Cognition Arousal/Alertness: Awake/alert Behavior During Therapy: WFL for tasks assessed/performed Overall Cognitive Status: Within Functional Limits for tasks assessed Area of Impairment: Problem solving;Safety/judgement;Attention                   Current Attention Level: Selective     Safety/Judgement: Decreased awareness of deficits   Problem Solving: Difficulty sequencing;Requires verbal cues;Requires tactile cues          Exercises Exercises: General Lower Extremity;Other exercises General Exercises - Lower Extremity Ankle Circles/Pumps: AROM;AAROM;5 reps Gluteal Sets: AROM;10 reps Heel Slides: AROM;10 reps Hip ABduction/ADduction: AROM;10 reps   Shoulder Instructions  General Comments Patient emotional and in tears for part of session. Reports feeling overwhelmed with sitaution and d/c disposition.    Pertinent Vitals/ Pain       Pain Assessment: Faces Faces Pain Scale: Hurts a little bit Pain Location: R knee Pain Descriptors / Indicators: Sore Pain Intervention(s): Monitored during session;Other (comment) (Patient declined use of ace wrap to R  knee)  Home Living                                          Prior Functioning/Environment              Frequency  Min 2X/week        Progress Toward Goals  OT Goals(current goals can now be found in the care plan section)  Progress towards OT goals: Progressing toward goals  Acute Rehab OT Goals Patient Stated Goal: independent but not sure where she will be living OT Goal Formulation: With patient ADL Goals Pt Will Perform Grooming: with set-up;sitting Pt Will Perform Lower Body Bathing: with supervision;sitting/lateral leans;sit to/from stand;with adaptive equipment Pt Will Perform Lower Body Dressing: with supervision;sit to/from stand;sitting/lateral leans;with adaptive equipment Pt Will Transfer to Toilet: with supervision;bedside commode Pt Will Perform Toileting - Clothing Manipulation and hygiene: with supervision;sitting/lateral leans;sit to/from stand;with adaptive equipment Pt Will Perform Tub/Shower Transfer: with supervision;tub bench;rolling walker  Plan Discharge plan remains appropriate;Frequency remains appropriate    Co-evaluation                 AM-PAC OT "6 Clicks" Daily Activity     Outcome Measure   Help from another person eating meals?: None Help from another person taking care of personal grooming?: A Little Help from another person toileting, which includes using toliet, bedpan, or urinal?: A Lot Help from another person bathing (including washing, rinsing, drying)?: A Lot Help from another person to put on and taking off regular upper body clothing?: A Little Help from another person to put on and taking off regular lower body clothing?: A Lot 6 Click Score: 16    End of Session Equipment Utilized During Treatment: Gait belt;Rolling walker  OT Visit Diagnosis: Unsteadiness on feet (R26.81);Other abnormalities of gait and mobility (R26.89);Muscle weakness (generalized) (M62.81);Pain;Other (comment) Pain -  Right/Left: Right Pain - part of body: Knee   Activity Tolerance Patient tolerated treatment well   Patient Left in bed;with call bell/phone within reach   Nurse Communication Mobility status        Time: 6803-2122 OT Time Calculation (min): 23 min  Charges: OT General Charges $OT Visit: 1 Visit OT Treatments $Therapeutic Activity: 23-37 mins  Heather [name] H. OTR/L Supplemental OT, Department of rehab services [phone]   Kashauna Celmer R H. 12/05/2020, 12:27 PM

## 2020-12-05 NOTE — Progress Notes (Signed)
PROGRESS NOTE  Heather [name]  DOB: 11-Jan-1976  PCP: Lucianne Lei, MD [id number]  Mutual: 10/30/2020  LOS: 21 days  Hospital Day: 66   Chief Complaint  Patient presents with   BH/CP    Brief narrative: Heather [name] is a 45 y.o. female with PMH significant for morbid obesity, DM2, obstructive sleep apnea, COPD, MDD, GAD, insomnia and RLS,renal mass,  and numerous small wounds under her pannus, upper thighs and between her legs.   Patient presented to the ED on 7/25 presented with right flank and suprapubic pain along with nausea and vomiting.   She was admitted and treated for complicated UTI with antibiotics and has completed treatment.  Currently medically stable for discharge to SNF.  Subjective: Patient was seen and examined this morning.   Morbid obese female. Lying on bed.  Not in distress.  No new symptoms.  Assessment/Plan: Complicated UTI -Patient completed 3 days of cefepime, urine culture colonized, currently off antibiotics. -Continue to monitor off antibiotics.   Nonobstructing nephrolithiasis Renal cyst -CT abdomen and pelvis from 7/26 showed bilateral nonobstructing stones including a staghorn calculus on the left.  Patient does not need any procedure at this time.  Can follow-up with urology as an outpatient. -CT scan abdomen from 7/19 also showed up 8.3 cm right upper pole renal cyst likely benign.   Suicidal ideation/anxiety and depression -Psychiatry has signed off, cleared the patient for discharge.   -Continue Wellbutrin, trazodone and gabapentin.   -Continue as needed clonazepam. Outpatient follow-up with psychiatry   CKD 3B -Used to be on indapamide in the past.  Currently on Lasix 40 mg daily.  Creatinine stable on last blood work done on 8/23. Recent Labs    10/30/20 1414 11/01/20 0350 11/02/20 0321 11/03/20 0950 11/05/20 0217 11/07/20 0521 11/11/20 0417 11/14/20 0435 11/18/20 0649 11/19/20 0501 11/21/20 0906 11/22/20 0305  11/26/20 0137 11/28/20 0541 12/05/20 0603  BUN '8 13 9 8 9  '$ --  16  --  20 19  --  21* 17  --   --   CREATININE 1.42* 1.57* 1.46* 1.24* 1.21* 1.16* 1.22* 1.29* 1.40* 1.24* 1.40* 1.30* 1.38* 1.32* 1.25*     Generalized deconditioning Homelessness -TOC following.  Will need SNF placement.   Panniculitis -Significantly improved.   Asthma -Stable, continue current inhalers.   Morbid obesity  -Body mass index is 75.76 kg/m. Patient has been advised to make an attempt to improve diet and exercise patterns to aid in weight loss.  Generalized anxiety disorder/restless legs -Continue Wellbutrin, BuSpar, Neurontin, Requip.   Mobility: limited mobility because of morbid obesity Code Status:   Code Status: Full Code  Nutritional status: Body mass index is 75.76 kg/m.     Diet:  Diet Order             Diet regular Room service appropriate? Yes; Fluid consistency: Thin  Diet effective now           Diet general                  DVT prophylaxis: Lovenox subcu   Antimicrobials: None Fluid: None Consultants: None Family Communication: None at bedside  Status is: Inpatient  Remains inpatient appropriate because: Pending SNF placement  Dispo: The patient is from: Homeless              Anticipated d/c is to: Pending SNF.  DTP              Patient currently is medically stable  to d/c.   Difficult to place patient Yes     Infusions:   sodium chloride      Scheduled Meds:  ascorbic acid  500 mg Oral Daily   buPROPion ER  200 mg Oral BID   clotrimazole   Topical BID   enoxaparin (LOVENOX) injection  100 mg Subcutaneous Q24H   fluticasone  2 spray Each Nare BID   furosemide  40 mg Oral Daily   gabapentin  200 mg Oral TID   montelukast  10 mg Oral QHS   nystatin   Topical TID   pantoprazole  40 mg Oral Daily   rOPINIRole  4 mg Oral QHS   senna-docusate  2 tablet Oral Daily   sodium chloride flush  3 mL Intravenous Q12H   traZODone  50 mg Oral QHS     Antimicrobials: Anti-infectives (From admission, onward)    Start     Dose/Rate Route Frequency Ordered Stop   11/01/20 1000  ceFEPIme (MAXIPIME) 2 g in sodium chloride 0.9 % 100 mL IVPB        2 g 200 mL/hr over 30 Minutes Intravenous Every 12 hours 10/31/20 1550 11/03/20 2309   11/01/20 0600  ceFEPIme (MAXIPIME) 2 g in sodium chloride 0.9 % 100 mL IVPB  Status:  Discontinued        2 g 200 mL/hr over 30 Minutes Intravenous Every 8 hours 10/31/20 1541 10/31/20 1550   10/31/20 1000  ertapenem (INVANZ) 1,000 mg in sodium chloride 0.9 % 100 mL IVPB  Status:  Discontinued        1 g 200 mL/hr over 30 Minutes Intravenous Every 24 hours 10/31/20 0948 10/31/20 1541       PRN meds: sodium chloride, acetaminophen **OR** acetaminophen, albuterol, clonazepam, ipratropium-albuterol, nitroGLYCERIN, ondansetron **OR** ondansetron (ZOFRAN) IV, senna-docusate, sodium chloride flush, traMADol, witch hazel-glycerin   Objective: Vitals:   12/04/20 2202 12/05/20 0939  BP: 107/66 113/62  Pulse: 81 79  Resp: 18 18  Temp: 98.3 F (36.8 C) 98.4 F (36.9 C)  SpO2: 95% 95%    Intake/Output Summary (Last 24 hours) at 12/05/2020 1503 Last data filed at 12/05/2020 0900 Gross per 24 hour  Intake 480 ml  Output 950 ml  Net -470 ml    Filed Weights   11/26/20 0500 11/27/20 0546 12/03/20 0600  Weight: (!) 196 kg (!) 196 kg (!) 194 kg   Weight change:  Body mass index is 75.76 kg/m.   Physical Exam: General exam: Pleasant, middle-aged Caucasian female.  Morbidly obese.  Skin: No rashes, lesions or ulcers. HEENT: Atraumatic, normocephalic, no obvious bleeding Lungs: Clear to auscultation bilaterally CVS: Regular rate and rhythm, no murmur GI/Abd soft, distended abdomen from obesity, nontender, bowel sounds present CNS: Alert, awake, oriented x3 Psychiatry: Mood appropriate Extremities: Chronic stasis changes  Data Review: I have personally reviewed the laboratory data and studies  available.  No results for input(s): WBC, NEUTROABS, HGB, HCT, MCV, PLT in the last 168 hours.  Recent Labs  Lab 12/05/20 0603  CREATININE 1.25*     F/u labs ordered Unresulted Labs (From admission, onward)     Start     Ordered   11/07/20 0500  Creatinine, serum  (enoxaparin (LOVENOX)    CrCl >/= 30 ml/min)  Weekly,   R     Comments: while on enoxaparin therapy    10/31/20 1548            Signed, Terrilee Croak, MD [facility] 12/05/2020

## 2020-12-05 NOTE — TOC Progression Note (Addendum)
Transition of Care [facility]) - Progression Note    Patient Details  Name: Heather [name] MRN: [id number] Date of Birth: 04-09-1975  Transition of [facility]) CM/SW [place], RN Phone Number: 12/05/2020, 8:17 AM  Clinical Narrative:    Case management called and spoke with Monia Pouch, CM at [facility] and the facility will have an available bariatric bed for the patient this week.  [place], CM is ordering a bariatric bed for the patient but a ready bed is not available at this time for LTC placement.  CM called and left a message with Earnest Bailey at [facility] to update her regarding probable bed offer at the facility pending LTC Medicaid approval.  CM sent a cooresponding email to [email or web address] to assist with LTC Medicaid follow up and placement at [facility].   Expected Discharge Plan: Skilled Nursing Facility Barriers to Discharge: [place] [facility]), Homeless with medical needs (Homeless)  Expected Discharge Plan and Services Expected Discharge Plan: [place] In-house Referral: Clinical Social Work   Post Acute Care Choice: [facility] (Needs LTC bed) Living arrangements for the past 2 months: Homeless Expected Discharge Date: 11/02/20                                     Social Determinants of Health (SDOH) Interventions    Readmission Risk Interventions No flowsheet data found.

## 2020-12-05 NOTE — Plan of Care (Signed)
  Problem: Acute Rehab OT Goals (only OT should resolve) Goal: Pt. Will Perform Grooming Flowsheets (Taken 12/05/2020 1220) Pt Will Perform Grooming:  with set-up  sitting Goal: Pt. Will Perform Lower Body Bathing Flowsheets (Taken 12/05/2020 1220) Pt Will Perform Lower Body Bathing:  with supervision  sitting/lateral leans  sit to/from stand  with adaptive equipment Goal: Pt. Will Perform Lower Body Dressing Flowsheets (Taken 12/05/2020 1220) Pt Will Perform Lower Body Dressing:  with supervision  sit to/from stand  sitting/lateral leans  with adaptive equipment Goal: Pt. Will Transfer To Toilet Flowsheets (Taken 12/05/2020 1220) Pt Will Transfer to Toilet:  with supervision  bedside commode Goal: Pt. Will Perform Toileting-Clothing Manipulation Flowsheets (Taken 12/05/2020 1220) Pt Will Perform Toileting - Clothing Manipulation and hygiene:  with supervision  sitting/lateral leans  sit to/from stand  with adaptive equipment Goal: Pt. Will Perform Tub/Shower Transfer Flowsheets (Taken 12/05/2020 1220) Pt Will Perform Tub/Shower Transfer:  with supervision  tub bench  rolling walker

## 2020-12-05 NOTE — Progress Notes (Signed)
Physical Therapy Treatment Patient Details Name: Heather [name] MRN: [id number] DOB: 1975-04-11 Today's Date: 12/05/2020    History of Present Illness 45 y.o. female presented to ED for chest pain and palpitations and feeling like her throat was closing while at her behavioral health facility for SI. Admitted 10/31/20 for treatment of complicated UTI PMH: anxiety and depression (hx of bipolar and schizoaffective disorder in chart in past), ACD, panniculitis, debility,GERD,  OSA not on cpap, chronic back pain.    PT Comments    Pt was seen for assistance to get to [facility] and to get cleaned up from wet bed.  Pt did not call nursing to get cleaned up but reports she is aware of the situation.  Pt declined getting to the chair but with help to [facility] and back to bed did take a few steps.  Follow up with more time OOB as tolerated, with the discussion about her end goals.  Pt reported she is not going directly home and did not see how sitting up to get home is immediately relevant.  Did talk with her about her long range plans.  Messaged NP about her skin on legs and peri area, and about the request for a R knee brace.  Will need to get skin managed and likely a custom fit brace given body habitus, so would recommend waiting to fulfill a brace order.   Follow Up Recommendations  SNF     Equipment Recommendations  None recommended by PT    Recommendations for Other Services       Precautions / Restrictions Precautions Precautions: Fall Precaution Comments: fell prior to hosp Required Braces or Orthoses:  (discussion about a knee brace in sticky note) Restrictions Weight Bearing Restrictions: No    Mobility  Bed Mobility Overal bed mobility: Needs Assistance Bed Mobility: Rolling;Supine to Sit;Sit to Supine Rolling: Supervision   Supine to sit: Supervision;HOB elevated Sit to supine: Min assist   General bed mobility comments: help to return legs to bed, pt is letting PT initiate all  transitions    Transfers Overall transfer level: Needs assistance Equipment used: Rolling walker (2 wheeled);1 person hand held assist Transfers: Sit to/from Stand Sit to Stand: Min assist         General transfer comment: min assist for lift and then pt is S level for rest of the transition and to bed with walker  Ambulation/Gait Ambulation/Gait assistance: Min guard Gait Distance (Feet): 6 Feet (3 x 2) Assistive device: Rolling walker (2 wheeled) Gait Pattern/deviations: Step-through pattern;Decreased stride length;Antalgic;Trunk flexed Gait velocity: reduced Gait velocity interpretation: <1.31 ft/sec, indicative of household ambulator General Gait Details: trip to and from [facility], refusing to stay up in chair   Stairs             Wheelchair Mobility    Modified Rankin (Stroke Patients Only)       Balance Overall balance assessment: Needs assistance Sitting-balance support: Feet supported;Bilateral upper extremity supported Sitting balance-Leahy Scale: Fair     Standing balance support: Bilateral upper extremity supported Standing balance-Leahy Scale: Poor Standing balance comment: standing with walker and controls balance with flexed posture                            Cognition Arousal/Alertness: Awake/alert Behavior During Therapy: WFL for tasks assessed/performed Overall Cognitive Status: Within Functional Limits for tasks assessed Area of Impairment: Problem solving;Safety/judgement;Attention  Current Attention Level: Selective     Safety/Judgement: Decreased awareness of safety;Decreased awareness of deficits   Problem Solving: Difficulty sequencing;Requires verbal cues;Requires tactile cues        Exercises General Exercises - Lower Extremity Ankle Circles/Pumps: AROM;AAROM;5 reps Gluteal Sets: AROM;10 reps Heel Slides: AROM;10 reps Hip ABduction/ADduction: AROM;10 reps    General Comments General  comments (skin integrity, edema, etc.): Pt was assisted to [facility] and note her skin was inflamed on peri area and on inner thighs.  Will focus on getting her to chair but BSC was her limit of willingness today, as well as distance to walk      Pertinent Vitals/Pain Pain Assessment: Faces Faces Pain Scale: Hurts a little bit Pain Location: R knee Pain Descriptors / Indicators: Sore Pain Intervention(s): Monitored during session;Repositioned    Home Living                      Prior Function            PT Goals (current goals can now be found in the care plan section) Acute Rehab PT Goals Patient Stated Goal: independent but not sure where she will be living    Frequency    Min 2X/week      PT Plan Current plan remains appropriate    Co-evaluation              AM-PAC PT "6 Clicks" Mobility   Outcome Measure  Help needed turning from your back to your side while in a flat bed without using bedrails?: A Little Help needed moving from lying on your back to sitting on the side of a flat bed without using bedrails?: A Little Help needed moving to and from a bed to a chair (including a wheelchair)?: A Little Help needed standing up from a chair using your arms (e.g., wheelchair or bedside chair)?: A Little Help needed to walk in hospital room?: A Little Help needed climbing 3-5 steps with a railing? : A Lot 6 Click Score: 17    End of Session   Activity Tolerance: Patient tolerated treatment well Patient left: in bed;with call bell/phone within reach Nurse Communication: Mobility status PT Visit Diagnosis: Unsteadiness on feet (R26.81);Muscle weakness (generalized) (M62.81);History of falling (Z91.81) Pain - Right/Left: Right Pain - part of body: Knee     Time: [id number] PT Time Calculation (min) (ACUTE ONLY): 41 min  Charges:  $Gait Training: 8-22 mins $Therapeutic Exercise: 8-22 mins $Therapeutic Activity: 8-22 mins                  Heather [name] 12/05/2020, 11:46 AM  Heather [name], PT MS Acute Rehab Dept. Number: [place] [facility] [place]

## 2020-12-06 NOTE — Progress Notes (Signed)
[facility] PROGRESS NOTE  Heather [name] [id number] DOB: 03-20-1976 DOA: 10/30/2020 PCP: Lucianne Lei, MD  Status: Remains inpatient appropriate because:Unsafe d/c plan  Dispo:  Patient From:  Homeless  Planned Disposition:    Medically stable for discharge:    Barriers to discharge: Morbid obesity/disability pending    Level of care: Med-Surg  Code Status: Full Family Communication:  DVT prophylaxis: Lovenox COVID vaccination status: Moderna x1 on 11/06/2020  and Pfizer x1 on 08/28/2020    HPI: 45 y.o. female with PMH significant for morbid obesity, DM2, obstructive sleep apnea, COPD, MDD, GAD, insomnia and RLS,renal mass,  and numerous small wounds under her pannus, upper thighs and between her legs.   Patient presented to the ED on 7/25 presented with right flank and suprapubic pain along with nausea and vomiting.   She was admitted and treated for complicated UTI with antibiotics and has completed treatment.  Currently medically stable for discharge to SNF.  Subjective: Alert and oriented.  Questions answered regarding discharge disposition.  Patient reports that when she was previously in [facility] right SI joint injection with steroid was recommended but unable to be accomplished.  Objective: Vitals:   12/05/20 2030 12/06/20 0458  BP: 130/71 (!) 97/57  Pulse: 91 76  Resp: 16 16  Temp: 98.5 F (36.9 C) 97.9 F (36.6 C)  SpO2: 96% 96%    Intake/Output Summary (Last 24 hours) at 12/06/2020 0824 Last data filed at 12/06/2020 [id number] Gross per 24 hour  Intake 1180 ml  Output 750 ml  Net 430 ml   Filed Weights   11/26/20 0500 11/27/20 0546 12/03/20 0600  Weight: (!) 196 kg (!) 196 kg (!) 194 kg    Exam:  Constitutional: NAD, calm, appears to be comfortable Respiratory: Lungs CTA, increased work of breathing.  Stable on room air Cardiovascular: S1-S2, regular pulse, soft nonpitting bilateral peripheral edema.  Extremities warm to touch Abdomen: no  tenderness, no masses palpated.  Obese with pannus.  Bowel sounds positive. LBM 8/29 Skin: Patient has reddening and moisture trapping underneath skin folds of pannus as well as lateral skin folds-so has redness and excoriation and skin folds of extremities especially lower extremities. Neurologic: CN 2-12 grossly intact. Sensation intact, Strength 3+-4/5 x all 4 extremities.  Psychiatric: Normal judgment and insight. Alert and oriented x 3. Normal mood.    Assessment/Plan: Acute problems: Complicated UTI presumed 2/2 Enterobacter colonization -Patient completed 3 days of cefepime, urine culture colonized, currently off antibiotics. -Continue to monitor off antibiotics.   Nonobstructing nephrolithiasis Renal cyst -CT abdomen and pelvis from 7/26 showed bilateral nonobstructing stones including a staghorn calculus on the left.  Patient does not need any procedure at this time.  Can follow-up with urology as an outpatient. -CT scan abdomen from 7/19 also showed up 8.3 cm right upper pole renal cyst likely benign.   Suicidal ideation/anxiety and depression -Psychiatry has signed off, cleared the patient for discharge.   -Continue Wellbutrin, trazodone and gabapentin.   -Continue as needed clonazepam. Outpatient follow-up with psychiatry  Right hip/back/leg pain -Patient reports that when at CIR this year discussion was held regarding possible image guided injection of SI joint -I have discussed with IR with the following response: 2 radiologists reviewed this patient, one is IR and one is a musculoskeletal rad, they both said that because of this patient's habitus/lack of equipment here in the hospital we won't be able to do an SI injection. The IR MD's do these in CT however due to  her habitus we wouldn't be able to fit her + the needle in the scanner and the MS[facility] MDs do these in fluoro with a c-arm but we only have the fixed tables here in the hospital. You can place an order for this to be  attempted at our outpatient spine center ([address]) but it would need to be reviewed by the radiologists there before it would be scheduled.  -Given size of leg and knee as well as current skin breakdown she is not a candidate for splint or other immobilizing device to right knee.  CKD 3B -Used to be on indapamide in the past.  Currently on Lasix 40 mg daily.  Creatinine stable on last blood work done on 8/23. Recent Labs (within last 365 days)                  Recent Labs    10/30/20 1414 11/01/20 0350 11/02/20 0321 11/03/20 0950 11/05/20 0217 11/07/20 0521 11/11/20 0417 11/14/20 0435 11/18/20 0649 11/19/20 0501 11/21/20 0906 11/22/20 0305 11/26/20 0137 11/28/20 0541  BUN '8 13 9 8 9  '$ --  16  --  20 19  --  21* 17  --   CREATININE 1.42* 1.57* 1.46* 1.24* 1.21* 1.16* 1.22* 1.29* 1.40* 1.24* 1.40* 1.30* 1.38* 1.32*       Generalized deconditioning Homelessness -PT/OT recommending SNF.  Patient lacks appropriate insight into degree of disability she has although she is motivated to walk even though she requires minimal assistance she has been noted to have heavy use of the rails to be able to move around the bed and sit down.  Continues to require assistance to move from seated to standing position and reports continued right knee pain and instability.  She is high risk for fall -We will ask OT to see for stabilizing brace to see if this will help with mobility -TOC following.  Will need SNF placement.   Yeast panniculitis -Significantly improved. -Increase frequency of nystatin powder to all of intertriginous areas (TID) -Instructions given for staff to wash all intertriginous areas with water and pat dry before applying a light dusting of the nystatin powder   Asthma -Stable, continue current inhalers.   Morbid obesity  -Body mass index is 76.53 kg/m. Patient has been advised to make an attempt to improve diet and exercise patterns to aid in weight loss.    Generalized anxiety disorder/restless legs -Continue Wellbutrin, BuSpar, Neurontin, Requip.      Data Reviewed: Basic Metabolic Panel: Recent Labs  Lab 12/05/20 0603  CREATININE 1.25*   Liver Function Tests: No results for input(s): AST, ALT, ALKPHOS, BILITOT, PROT, ALBUMIN in the last 168 hours. No results for input(s): LIPASE, AMYLASE in the last 168 hours. No results for input(s): AMMONIA in the last 168 hours. CBC: No results for input(s): WBC, NEUTROABS, HGB, HCT, MCV, PLT in the last 168 hours. Cardiac Enzymes: No results for input(s): CKTOTAL, CKMB, CKMBINDEX, TROPONINI in the last 168 hours. BNP (last 3 results) No results for input(s): BNP in the last 8760 hours.  ProBNP (last 3 results) No results for input(s): PROBNP in the last 8760 hours.  CBG: No results for input(s): GLUCAP in the last 168 hours.  No results found for this or any previous visit (from the past 240 hour(s)).   Studies: No results found.  Scheduled Meds:  ascorbic acid  500 mg Oral Daily   buPROPion ER  200 mg Oral BID   clotrimazole   Topical BID  enoxaparin (LOVENOX) injection  100 mg Subcutaneous Q24H   fluticasone  2 spray Each Nare BID   furosemide  40 mg Oral Daily   gabapentin  200 mg Oral TID   montelukast  10 mg Oral QHS   nystatin   Topical TID   pantoprazole  40 mg Oral Daily   rOPINIRole  4 mg Oral QHS   senna-docusate  2 tablet Oral Daily   sodium chloride flush  3 mL Intravenous Q12H   traZODone  50 mg Oral QHS   Continuous Infusions:  sodium chloride      Principal Problem:   Complicated UTI (urinary tract infection) Active Problems:   Asthma   PALPITATIONS, OCCASIONAL   Panniculitis   OSA (obstructive sleep apnea)   Major depressive disorder, recurrent episode, moderate (HCC)   Generalized anxiety disorder   Major depression, chronic   Thrombocytosis   Hypokalemia   CKD (chronic kidney disease), stage III  ([place])   Consultants: Psychiatry  Procedures: None  Antibiotics: Fosfomycin x1 on 7/19, Macrodantin 7/21 and 7/22 Ertapenem x1 on 7/26 Cefepime 11/02/2027   Time spent: 35 minutes    Erin Hearing ANP  [facility] 7 am - 330 pm/M-F for direct patient care and secure chat Please refer to Amion for contact info 35  days

## 2020-12-06 NOTE — TOC Progression Note (Signed)
Transition of Care [facility]) - Progression Note    Patient Details  Name: Heather [name] MRN: [id number] Date of Birth: 1976/03/14  Transition of Care [facility]) CM/SW [place], RN Phone Number: 12/06/2020, 12:10 PM  Clinical Narrative:    Case management spoke with [place] [facility] and the facility is currently not receiving any new admissions due to staffing issues.  The patient was made aware.  I refaxed the patient out in the hub to [facility] [place] [place] and [facility] in [place] and [place], [place].  Message left with the facilities requesting review of clinicals.   Expected Discharge Plan: Skilled Nursing Facility Barriers to Discharge: [place] [facility]), Homeless with medical needs (Homeless)  Expected Discharge Plan and Services Expected Discharge Plan: [place] In-house Referral: Clinical Social Work   Post Acute Care Choice: [place] (Needs LTC bed) Living arrangements for the past 2 months: Homeless Expected Discharge Date: 11/02/20                                     Social Determinants of Health (SDOH) Interventions    Readmission Risk Interventions No flowsheet data found.

## 2020-12-07 MED ORDER — TRAMADOL HCL 50 MG PO TABS
50.0000 mg | ORAL_TABLET | Freq: Once | ORAL | Status: AC
  Administered 2020-12-07: 50 mg via ORAL
  Filled 2020-12-07: qty 1

## 2020-12-07 NOTE — Progress Notes (Signed)
Occupational Therapy Treatment Patient Details Name: Heather [name] MRN: [id number] DOB: [birth year] Today's Date: 12/07/2020    History of present illness 45 y.o. female presented to ED for chest pain and palpitations and feeling like her throat was closing while at her behavioral health facility for SI. Admitted 10/31/20 for treatment of complicated UTI PMH: anxiety and depression (hx of bipolar and schizoaffective disorder in chart in past), ACD, panniculitis, debility,GERD,  OSA not on cpap, chronic back pain.   OT comments  Patient met lying supine in bed. Reports pain in low back. Patient continues to require increased coaxing/encouragement for participation. Patient demonstrates self-limiting behavior and is often dishonest about completion of tasks including bathing and sitting in recliner. Patient c/o back pain with sitting in recliner several days ago so RN staff replaced chair with mechanical bariatric recliner. Upon entry this date, patient reporting getting to  [facility] for toileting without assist and to chair this a.m. with RN in room. This Probation officer stepped out of room to confirm with nursing staff (who ensure this Probation officer that patient did transfer to [facility] but refused transfer to recliner this a.m.). Upon my return to room, patient was seated EOB and standing without external assist. Patient states "I can get myself to the chair! I just want you to stop interrupting me when I'm trying to get suff done!" This writer attempted to initiate conversation to reassess patient goals but patient refused to speak any further with this Probation officer. Patient admitted with complicated UTI >02 days ago and is likely near baseline level of function. Recommendation updated to homeless shelter. OT will continue to follow acutely to maximize safety/independence with self-care tasks in prep for safe d/c.    Follow Up Recommendations  Other (comment) (Homeless shelter)    Equipment Recommendations  3 in 1 bedside  commode;Tub/shower bench;Other (comment) (Bariatric DME as listed above if not d/c'd to SNF)    Recommendations for Other Services      Precautions / Restrictions Precautions Precautions: Fall Restrictions Weight Bearing Restrictions: No       Mobility Bed Mobility Overal bed mobility: Needs Assistance       Supine to sit: HOB elevated;Modified independent (Device/Increase time)     General bed mobility comments: Patient initially refusing OOB activity reporting getting to chair with RN earlier this date. OT left room to confirm with RN and upon return patient at EOB and preparing to stand.    Transfers Overall transfer level: Needs assistance Equipment used: Rolling walker (2 wheeled);1 person hand held assist Transfers: Sit to/from Stand Sit to Stand: Modified independent (Device/Increase time)         General transfer comment: Patient stood from EOB without external assist. Given RW for safety.    Balance Overall balance assessment: Needs assistance Sitting-balance support: Feet supported;Bilateral upper extremity supported Sitting balance-Leahy Scale: Good     Standing balance support: Bilateral upper extremity supported Standing balance-Leahy Scale: Fair Standing balance comment: Reliant on BUE on RW with functional mobility                           ADL either performed or assessed with clinical judgement   ADL Overall ADL's : Needs assistance/impaired                                             Vision  Perception     Praxis      Cognition Arousal/Alertness: Awake/alert Behavior During Therapy: WFL for tasks assessed/performed Overall Cognitive Status: Within Functional Limits for tasks assessed Area of Impairment: Problem solving;Safety/judgement;Attention                   Current Attention Level: Selective     Safety/Judgement: Decreased awareness of safety              Exercises Other  Exercises Other Exercises: Refused all ther ex   Shoulder Instructions       General Comments Patient is not motivated for participation with rehab services. Reports completing toileting without external assist on BSC earlier this date. Patient transitioned from supine to EOB and from EOB to standing without external assist. Patient functioning [place] to presumed baseline at this time.    Pertinent Vitals/ Pain       Pain Assessment: Faces Faces Pain Scale: Hurts a little bit Pain Location: Low back Pain Intervention(s): Monitored during session;Repositioned  Home Living                                          Prior Functioning/Environment              Frequency  Min 2X/week        Progress Toward Goals  OT Goals(current goals can now be found in the care plan section)  Progress towards OT goals: Progressing toward goals (Progress limited secondary to self-limiting behavior)  Acute Rehab OT Goals Patient Stated Goal: No goals stated. OT Goal Formulation: With patient ADL Goals Pt Will Perform Grooming: with modified independence;standing Pt Will Perform Lower Body Bathing: with modified independence;sit to/from stand;sitting/lateral leans;with adaptive equipment Pt Will Perform Lower Body Dressing: with modified independence;with adaptive equipment;sitting/lateral leans;sit to/from stand Pt Will Transfer to Toilet: with modified independence;bedside commode;ambulating Pt Will Perform Toileting - Clothing Manipulation and hygiene: with modified independence;with adaptive equipment;sitting/lateral leans;sit to/from stand Pt Will Perform Tub/Shower Transfer: with modified independence;rolling walker  Plan Discharge plan needs to be updated;Frequency remains appropriate    Co-evaluation                 AM-PAC OT "6 Clicks" Daily Activity     Outcome Measure   Help from another person eating meals?: None Help from another person taking care  of personal grooming?: A Little Help from another person toileting, which includes using toliet, bedpan, or urinal?: A Lot Help from another person bathing (including washing, rinsing, drying)?: A Lot Help from another person to put on and taking off regular upper body clothing?: A Little Help from another person to put on and taking off regular lower body clothing?: A Lot 6 Click Score: 16    End of Session Equipment Utilized During Treatment: Rolling walker  OT Visit Diagnosis: Unsteadiness on feet (R26.81);Other abnormalities of gait and mobility (R26.89);Muscle weakness (generalized) (M62.81);Pain;Other (comment) Pain - part of body:  (low back)   Activity Tolerance Treatment limited secondary to agitation   Patient Left with call bell/phone within reach;in chair   Nurse Communication Mobility status;Other (comment) (Response to treatment)        Time: 3149-7026 OT Time Calculation (min): 27 min  Charges: OT General Charges $OT Visit: 1 Visit OT Treatments $Therapeutic Activity: 23-37 mins  Temara Lanum H. OTR/L Supplemental OT, Department of rehab services [phone]   Kealy Lewter R H.  12/07/2020, 1:18 PM

## 2020-12-07 NOTE — Progress Notes (Signed)
Pt was able to stay in the chair for 2hrs. Complaints of back pain but medication was given. Will continue to monitor pt's mobility.

## 2020-12-07 NOTE — TOC Progression Note (Addendum)
Transition of Care [facility]) - Progression Note    Patient Details  Name: Izadora Huser Sabala MRN: [id number] Date of Birth: September 10, 1975  Transition of Care [facility]) CM/SW [place], RN Phone Number: 12/07/2020, 7:50 AM  Clinical Narrative:    Case management spoke with Frederich Balding, CM at [facility] to check on bed availability at [facility] since [facility] is currently not taking new admissions due to staffing.  CM with DTP Team will continue to follow the patient for SNF placement.  12/07/2020 [place] does not have available LTC beds.  [place], RNCM at [facility] facility is checking on admission potential at [facility] in [place], [place].  12/07/2020 [id number] - I called and spoke with Ebony Hail, admissions CM to check on bed available at area [facility] for placement.     Expected Discharge Plan: Skilled Nursing Facility Barriers to Discharge: [place] [facility]), Homeless with medical needs (Homeless)  Expected Discharge Plan and Services Expected Discharge Plan: [place] In-house Referral: Clinical Social Work   Post Acute Care Choice: [place] (Needs LTC bed) Living arrangements for the past 2 months: Homeless Expected Discharge Date: 11/02/20                                     Social Determinants of Health (SDOH) Interventions    Readmission Risk Interventions No flowsheet data found.

## 2020-12-07 NOTE — Progress Notes (Addendum)
[facility] PROGRESS NOTE  Heather [name] [id number] DOB: 03/27/1976 DOA: 10/30/2020 PCP: Lucianne Lei, MD  Status: Remains inpatient appropriate because:Unsafe d/c plan  Dispo:  Patient From:  Homeless-newly homeless since May 2022  Planned Disposition: Homeless shelter  Medically stable for discharge: Yes  Barriers to discharge: Morbid obesity/disability pending    Level of care: Med-Surg  Code Status: Full Family Communication: Patient only DVT prophylaxis: Lovenox COVID vaccination status: Moderna x1 on 11/06/2020  and Pfizer x1 on 08/28/2020    HPI: 45 y.o. female with PMH significant for morbid obesity, DM2, obstructive sleep apnea, COPD, MDD, GAD, insomnia and RLS,renal mass,  and numerous small wounds under her pannus, upper thighs and between her legs.   Patient presented to the ED on 7/25 presented with right flank and suprapubic pain along with nausea and vomiting.   She was admitted and treated for complicated UTI with antibiotics and has completed treatment.  Currently medically stable for discharge to SNF.  Subjective: Awake.  Began to cry discussing discharge planning and recent phone call from guardian for her children.  Objective: Vitals:   12/06/20 2020 12/07/20 0555  BP: 115/71 (!) 103/59  Pulse: 93 71  Resp: 17 18  Temp: 98.9 F (37.2 C) 97.7 F (36.5 C)  SpO2: 99% 95%    Intake/Output Summary (Last 24 hours) at 12/07/2020 0819 Last data filed at 12/07/2020 0700 Gross per 24 hour  Intake 917 ml  Output 750 ml  Net 167 ml   Filed Weights   11/26/20 0500 11/27/20 0546 12/03/20 0600  Weight: (!) 196 kg (!) 196 kg (!) 194 kg    Exam:  Constitutional: NAD, calm, appears to be comfortable Respiratory: Lungs CTA, increased work of breathing.  Stable on room air Cardiovascular: S1-S2, regular pulse, soft nonpitting bilateral peripheral edema.  Extremities warm to touch Abdomen: no tenderness, no masses palpated.  Obese with pannus.   Bowel sounds positive. LBM 8/29 Skin: Patient has reddening and moisture trapping underneath skin folds of pannus as well as lateral skin folds-so has redness and excoriation and skin folds of extremities especially lower extremities. Neurologic: CN 2-12 grossly intact. Sensation intact, Strength 3+-4/5 x all 4 extremities.  Psychiatric: Normal judgment and insight. Alert and oriented x 3. Normal mood.    Assessment/Plan: Acute problems: Complicated UTI presumed 2/2 Enterobacter colonization -Patient completed 3 days of cefepime, urine culture colonized, currently off antibiotics.   Nonobstructing nephrolithiasis Renal cyst -CT abdomen and pelvis from 7/26 showed bilateral nonobstructing stones including a staghorn calculus on the left.  Patient does not need any procedure at this time.  Can follow-up with urology as an outpatient. -CT scan abdomen from 7/19 also showed up 8.3 cm right upper pole renal cyst likely benign.   Suicidal ideation/anxiety and depression -Psychiatry has signed off, cleared the patient for discharge.   -Continue Wellbutrin, trazodone and gabapentin.   -Continue as needed clonazepam. Outpatient follow-up with psychiatry  Right hip/back/leg pain -Patient reports that when at [facility] this year discussion was held regarding possible image guided injection of SI joint -I have discussed with IR with the following response: 2 radiologists reviewed this patient, one is IR and one is a musculoskeletal rad, they both said that because of this patient's habitus/lack of equipment here in the hospital we won't be able to do an SI injection. The IR MD's do these in CT however due to her habitus we wouldn't be able to fit her + the needle in the scanner and  the MS[facility] MDs do these in fluoro with a c-arm but we only have the fixed tables here in the hospital. You can place an order for this to be attempted at our outpatient spine center ([address]) but it would need to be reviewed by  the radiologists there before it would be scheduled.  -Given size of leg and knee as well as current skin breakdown she is not a candidate for splint or other immobilizing device to right knee.  CKD 3B -Used to be on indapamide in the past.  Currently on Lasix 40 mg daily.  Creatinine stable on last blood work done on 8/23. Recent Labs (within last 365 days)                  Recent Labs    10/30/20 1414 11/01/20 0350 11/02/20 0321 11/03/20 0950 11/05/20 0217 11/07/20 0521 11/11/20 0417 11/14/20 0435 11/18/20 0649 11/19/20 0501 11/21/20 0906 11/22/20 0305 11/26/20 0137 11/28/20 0541  BUN '8 13 9 8 9  '$ --  16  --  20 19  --  21* 17  --   CREATININE 1.42* 1.57* 1.46* 1.24* 1.21* 1.16* 1.22* 1.29* 1.40* 1.24* 1.40* 1.30* 1.38* 1.32*       Generalized deconditioning Homelessness -PT/OT recommending SNF.  Patient lacks appropriate insight into degree of disability she has although she is motivated to walk even though she requires minimal assistance she has been noted to have heavy use of the rails to be able to move around the bed and sit down.  Continues to require assistance to move from seated to standing position and reports continued right knee pain and instability.  She is high risk for fall   Yeast panniculitis -Significantly improved. -Continue nystatin powder to all of intertriginous areas (TID) -Instructions given for staff to wash all intertriginous areas with water and pat dry before applying a light dusting of the nystatin powder   Asthma -Stable, continue current inhalers.   Morbid obesity  -Body mass index is 76.53 kg/m. Patient has been advised to make an attempt to improve diet and exercise patterns to aid in weight loss.   Generalized anxiety disorder/restless legs -Continue Wellbutrin, BuSpar, Neurontin, Requip.      Data Reviewed: Basic Metabolic Panel: Recent Labs  Lab 12/05/20 0603  CREATININE 1.25*   Liver Function Tests: No results for  input(s): AST, ALT, ALKPHOS, BILITOT, PROT, ALBUMIN in the last 168 hours. No results for input(s): LIPASE, AMYLASE in the last 168 hours. No results for input(s): AMMONIA in the last 168 hours. CBC: No results for input(s): WBC, NEUTROABS, HGB, HCT, MCV, PLT in the last 168 hours. Cardiac Enzymes: No results for input(s): CKTOTAL, CKMB, CKMBINDEX, TROPONINI in the last 168 hours. BNP (last 3 results) No results for input(s): BNP in the last 8760 hours.  ProBNP (last 3 results) No results for input(s): PROBNP in the last 8760 hours.  CBG: No results for input(s): GLUCAP in the last 168 hours.  No results found for this or any previous visit (from the past 240 hour(s)).   Studies: No results found.  Scheduled Meds:  ascorbic acid  500 mg Oral Daily   buPROPion ER  200 mg Oral BID   clotrimazole   Topical BID   enoxaparin (LOVENOX) injection  100 mg Subcutaneous Q24H   fluticasone  2 spray Each Nare BID   furosemide  40 mg Oral Daily   gabapentin  200 mg Oral TID   montelukast  10 mg Oral  QHS   nystatin   Topical TID   pantoprazole  40 mg Oral Daily   rOPINIRole  4 mg Oral QHS   senna-docusate  2 tablet Oral Daily   sodium chloride flush  3 mL Intravenous Q12H   traZODone  50 mg Oral QHS   Continuous Infusions:  sodium chloride      Principal Problem:   Complicated UTI (urinary tract infection) Active Problems:   Asthma   PALPITATIONS, OCCASIONAL   Panniculitis   OSA (obstructive sleep apnea)   Major depressive disorder, recurrent episode, moderate (HCC)   Generalized anxiety disorder   Major depression, chronic   Thrombocytosis   Hypokalemia   CKD (chronic kidney disease), stage III ([place])   Consultants: Psychiatry  Procedures: None  Antibiotics: Fosfomycin x1 on 7/19, Macrodantin 7/21 and 7/22 Ertapenem x1 on 7/26 Cefepime 11/02/2027   Time spent: 35 minutes    Erin Hearing ANP  [facility] 7 am - 330 pm/M-F for direct patient care and  secure chat Please refer to Amion for contact info 36  days

## 2020-12-07 NOTE — Progress Notes (Addendum)
Physical Therapy Treatment Patient Details Name: Wiley Seelinger Fincher MRN: [id number] DOB: 03/28/1976 Today's Date: 12/07/2020    History of Present Illness 45 y.o. female presented to ED for chest pain and palpitations and feeling like her throat was closing while at her behavioral health facility for SI. Admitted 10/31/20 for treatment of complicated UTI PMH: anxiety and depression (hx of bipolar and schizoaffective disorder in chart in past), ACD, panniculitis, debility,GERD,  OSA not on cpap, chronic back pain.    PT Comments    Pt limited by pain during session and requires significant encouragement to participate in mobility this session. Pt does not require physical assistance to mobilize this session, with minG from PT for safety. Pt's activity tolerance is greatest limiting factor. PT encourages continued ambulation attempts to further improve tolerance for mobility. PT will continue to follow in an effort to restore independence.    Follow Up Recommendations  Outpatient PT, pt too high level for SNF     Equipment Recommendations  None recommended by PT (owns bariatric RW)    Recommendations for Other Services       Precautions / Restrictions Precautions Precautions: Fall Precaution Comments: fell prior to hosp Restrictions Weight Bearing Restrictions: No    Mobility  Bed Mobility Overal bed mobility: Needs Assistance Bed Mobility: Supine to Sit;Sit to Supine     Supine to sit: Supervision Sit to supine: Supervision   General bed mobility comments: increased time, use of rails    Transfers Overall transfer level: Needs assistance Equipment used: None Transfers: Sit to/from Stand Sit to Stand: Supervision         General transfer comment: Patient stood from EOB without external assist. Given RW for safety.  Ambulation/Gait Ambulation/Gait assistance: Min guard Gait Distance (Feet): 80 Feet Assistive device:  (bariatric RW) Gait Pattern/deviations:  Step-through pattern;Trunk flexed Gait velocity: pt with slowed step-through gait, widened BOS, increased turnk flexion over RW Gait velocity interpretation: <1.8 ft/sec, indicate of risk for recurrent falls General Gait Details: pt with step-through gait,   Stairs             Wheelchair Mobility    Modified Rankin (Stroke Patients Only)       Balance Overall balance assessment: Needs assistance Sitting-balance support: No upper extremity supported;Feet supported Sitting balance-Leahy Scale: Fair     Standing balance support: No upper extremity supported Standing balance-Leahy Scale: Fair Standing balance comment: Reliant on BUE on RW with functional mobility                            Cognition Arousal/Alertness: Awake/alert Behavior During Therapy: WFL for tasks assessed/performed Overall Cognitive Status: Within Functional Limits for tasks assessed Area of Impairment: Problem solving;Safety/judgement;Attention                   Current Attention Level: Selective     Safety/Judgement: Decreased awareness of safety            Exercises Other Exercises Other Exercises: Refused all ther ex    General Comments General comments (skin integrity, edema, etc.): pt requires encouragement to participate in mobility. PT discusses progress made during this admission, pt often continues to focus on back pain from sitting in recliner.      Pertinent Vitals/Pain Pain Assessment: Faces Faces Pain Scale: Hurts even more Pain Location: low back and R hip Pain Descriptors / Indicators: Crying Pain Intervention(s): Monitored during session    Home Living  Prior Function            PT Goals (current goals can now be found in the care plan section) Acute Rehab PT Goals Patient Stated Goal: to reduce pain Progress towards PT goals: Progressing toward goals    Frequency    Min 2X/week      PT Plan Current plan  remains appropriate    Co-evaluation              AM-PAC PT "6 Clicks" Mobility   Outcome Measure  Help needed turning from your back to your side while in a flat bed without using bedrails?: A Little Help needed moving from lying on your back to sitting on the side of a flat bed without using bedrails?: A Little Help needed moving to and from a bed to a chair (including a wheelchair)?: A Little Help needed standing up from a chair using your arms (e.g., wheelchair or bedside chair)?: A Little Help needed to walk in hospital room?: A Little Help needed climbing 3-5 steps with a railing? : A Lot 6 Click Score: 17    End of Session   Activity Tolerance: Patient limited by pain Patient left: in bed;with call bell/phone within reach Nurse Communication: Mobility status PT Visit Diagnosis: Unsteadiness on feet (R26.81);Muscle weakness (generalized) (M62.81);History of falling (Z91.81) Pain - Right/Left: Right Pain - part of body: Hip (low back)     Time: [id number] PT Time Calculation (min) (ACUTE ONLY): 18 min  Charges:  $Gait Training: 8-22 mins                     Zenaida Niece, PT, DPT Acute Rehabilitation Pager: [place] 12/07/2020, 3:40 PM

## 2020-12-07 NOTE — Progress Notes (Signed)
Pt refused to be moved to bed. Pt said the chair is uncomfortable. She was emotional and not motivated to move to chair. Educate pt on the importance of mobility. RN requested for bariatric chair. MD notified. Willl continue to monitor pt's mobility.

## 2020-12-08 MED ORDER — OXYCODONE HCL 5 MG PO TABS: 5.0000 mg | ORAL_TABLET | Freq: Four times a day (QID) | ORAL | Status: DC | PRN

## 2020-12-08 MED ORDER — MELOXICAM 7.5 MG PO TABS
7.5000 mg | ORAL_TABLET | Freq: Every day | ORAL | Status: DC
  Administered 2020-12-08 – 2020-12-09 (×2): 7.5 mg via ORAL
  Filled 2020-12-08 (×2): qty 1

## 2020-12-08 MED ORDER — DICLOFENAC SODIUM 1 % EX GEL
4.0000 g | Freq: Four times a day (QID) | CUTANEOUS | Status: DC
  Administered 2020-12-08 – 2021-01-05 (×86): 4 g via TOPICAL
  Filled 2020-12-08 (×2): qty 100

## 2020-12-08 MED ORDER — METHOCARBAMOL 500 MG PO TABS
500.0000 mg | ORAL_TABLET | Freq: Four times a day (QID) | ORAL | Status: DC
  Administered 2020-12-08 – 2021-01-05 (×113): 500 mg via ORAL
  Filled 2020-12-08 (×111): qty 1

## 2020-12-08 MED ORDER — TOPIRAMATE 25 MG PO TABS
25.0000 mg | ORAL_TABLET | Freq: Two times a day (BID) | ORAL | Status: DC
  Administered 2020-12-08 – 2021-01-05 (×56): 25 mg via ORAL
  Filled 2020-12-08 (×59): qty 1

## 2020-12-08 MED ORDER — COVID-19 MRNA VAC-TRIS(PFIZER) 30 MCG/0.3ML IM SUSP
0.3000 mL | Freq: Once | INTRAMUSCULAR | Status: DC
  Filled 2020-12-08: qty 0.3

## 2020-12-08 MED ORDER — PREGABALIN 75 MG PO CAPS
75.0000 mg | ORAL_CAPSULE | Freq: Three times a day (TID) | ORAL | Status: DC
  Administered 2020-12-08 – 2020-12-14 (×19): 75 mg via ORAL
  Filled 2020-12-08 (×19): qty 1

## 2020-12-08 NOTE — Progress Notes (Signed)
[facility] PROGRESS NOTE  Heather [name] [id number] DOB: 12-Aug-1975 DOA: 10/30/2020 PCP: Lucianne Lei, MD  Status: Remains inpatient appropriate because:Unsafe d/c plan  Dispo:  Patient From:  Homeless-newly homeless since May 2022  Planned Disposition: Homeless shelter once can obtain bed  Medically stable for discharge: Yes  Barriers to discharge: Morbid obesity/disability pending    Level of care: Med-Surg  Code Status: Full Family Communication: Patient only DVT prophylaxis: Lovenox COVID vaccination status: Moderna x1 on 11/06/2020  and Pfizer x1 on 08/28/2020    HPI: 45 y.o. female with PMH significant for morbid obesity, DM2, obstructive sleep apnea, COPD, MDD, GAD, insomnia and RLS,renal mass,  and numerous small wounds under her pannus, upper thighs and between her legs.   Patient presented to the ED on 7/25 presented with right flank and suprapubic pain along with nausea and vomiting.   She was admitted and treated for complicated UTI with antibiotics and has completed treatment.  Currently medically stable for discharge to SNF.  Subjective: Patient alert.  Complaining of significant back pain after excessive physical activity yesterday.  Noted onset of back pain after sitting up in bariatric chair which she believes is the cause to her back pain.  CM myself both discussed that patient at this time no longer meets requirement for SNF placement.  She is very eager to regain custody of her children.  We reemphasized that going to an SNF would delay this.  We are focusing on obtaining a homeless shelter bed in a shelter that does not force residents out during the day.  She previously had been accepted at the [facility] but was unable to meet their requirement of standing 8 hours a day to wash dishes.  We suggested that when she calls the [facility] shelter to determine if there are other activities she can do while seated such as folding  laundry/towels/linen.  Back pain she reports that prior to admission she was on Topamax, meloxicam and Robaxin per pain clinic.  I discontinued her Ultram and resumed the previous medications.  I also discontinued her Neurontin in favor of Lyrica which is now available generic at lower cost.  Patient was also encouraged to follow-up with DSS regarding her disability application and we will also notify [facility] that patient has questions and concerns and will need assistance with her current disability application.  Objective: Vitals:   12/07/20 2019 12/08/20 0601  BP: 107/65 112/67  Pulse: 85 68  Resp: 16 18  Temp: 97.6 F (36.4 C) 97.9 F (36.6 C)  SpO2: 93% 96%    Intake/Output Summary (Last 24 hours) at 12/08/2020 0756 Last data filed at 12/08/2020 0600 Gross per 24 hour  Intake 1080 ml  Output 900 ml  Net 180 ml   Filed Weights   11/26/20 0500 11/27/20 0546 12/03/20 0600  Weight: (!) 196 kg (!) 196 kg (!) 194 kg    Exam:  Constitutional: NAD, calm, uncomfortable secondary to back pain Respiratory: Lungs remain clear to auscultation, no increased work of breathing.  Lying supine in bed.  Pulse oximetry normal Cardiovascular: Heart sounds are S1-S2, she is normotensive, she has no peripheral edema, skin is warm and dry Abdomen: no tenderness, no masses palpated.  Obese with pannus.  Bowel sounds positive. LBM 9/1 Skin: Patient has reddening and moisture trapping underneath skin folds of pannus as well as lateral skin folds-so has redness and excoriation and skin folds of extremities especially lower extremities. Neurologic: CN 2-12 grossly intact. Sensation intact,  Strength 3+-4/5 x all 4 extremities.  Psychiatric: Normal judgment and insight. Alert and oriented x 3. Normal mood.    Assessment/Plan: Acute problems: Complicated UTI presumed 2/2 Enterobacter colonization -Patient completed 3 days of cefepime, urine culture colonized,   Nonobstructing  nephrolithiasis Renal cyst -CT abdomen and pelvis from 7/26 showed bilateral nonobstructing stones including a staghorn calculus on the left.  Patient does not need any procedure at this time.  Can follow-up with urology as an outpatient. -CT scan abdomen from 7/19 also showed up 8.3 cm right upper pole renal cyst likely benign.   Suicidal ideation/anxiety and depression -Psychiatry has signed off, cleared the patient for discharge.   -Continue Wellbutrin, trazodone and gabapentin.   -Continue as needed clonazepam. Outpatient follow-up with psychiatry  Right hip/back/leg pain -Patient reports that when at [facility] this year discussion was held regarding possible image guided injection of SI joint -I have discussed with IR with the following response: 2 radiologists reviewed this patient, one is IR and one is a musculoskeletal rad, they both said that because of this patient's habitus/lack of equipment here in the hospital we won't be able to do an SI injection. The IR MD's do these in CT however due to her habitus we wouldn't be able to fit her + the needle in the scanner and the [facility] MDs do these in fluoro with a c-arm but we only have the fixed tables here in the hospital. You can place an order for this to be attempted at our outpatient spine center ([address]) but it would need to be reviewed by the radiologists there before it would be scheduled.  -Given size of leg and knee as well as current skin breakdown she is not a candidate for splint or other immobilizing device to right knee. -Back pain has increased over the past 24 hours.  Patient have discontinued Ultram.  Resumed outpatient pain clinic medications of Topamax, meloxicam and Robaxin.  Have discontinue Neurontin in favor of Lyrica. -Gel pad ordered for bariatric chair  CKD 3B -Used to be on indapamide in the past.   --Creatinine stable with GFR less than 60 --Given need for meloxicam we will discontinue Lasix since no peripheral  edema Recent Labs (within last 365 days)                  Recent Labs    10/30/20 1414 11/01/20 0350 11/02/20 0321 11/03/20 0950 11/05/20 0217 11/07/20 0521 11/11/20 0417 11/14/20 0435 11/18/20 0649 11/19/20 0501 11/21/20 0906 11/22/20 0305 11/26/20 0137 11/28/20 0541  BUN '8 13 9 8 9  '$ --  16  --  20 19  --  21* 17  --   CREATININE 1.42* 1.57* 1.46* 1.24* 1.21* 1.16* 1.22* 1.29* 1.40* 1.24* 1.40* 1.30* 1.38* 1.32*       Generalized deconditioning Homelessness -PT/OT initially recommended SNF.  History recent therapy sessions demonstrate increased independence with mobility when utilizing rolling walker. -Primary barriers to ambulation are patient's motivation and ongoing back pain which is being treated as above   Yeast panniculitis -Significantly improved. -Continue nystatin powder to all of intertriginous areas (TID) -Instructions given for staff to wash all intertriginous areas with water and pat dry before applying a light dusting of the nystatin powder   Asthma -Stable, continue current inhalers.   Morbid obesity  -Body mass index is 76.53 kg/m. Patient has been advised to make an attempt to improve diet and exercise patterns to aid in weight loss.   Generalized anxiety  disorder/restless legs -Continue Wellbutrin, BuSpar, Neurontin, Requip.      Data Reviewed: Basic Metabolic Panel: Recent Labs  Lab 12/05/20 0603  CREATININE 1.25*   Liver Function Tests: No results for input(s): AST, ALT, ALKPHOS, BILITOT, PROT, ALBUMIN in the last 168 hours. No results for input(s): LIPASE, AMYLASE in the last 168 hours. No results for input(s): AMMONIA in the last 168 hours. CBC: No results for input(s): WBC, NEUTROABS, HGB, HCT, MCV, PLT in the last 168 hours. Cardiac Enzymes: No results for input(s): CKTOTAL, CKMB, CKMBINDEX, TROPONINI in the last 168 hours. BNP (last 3 results) No results for input(s): BNP in the last 8760 hours.  ProBNP (last 3 results) No  results for input(s): PROBNP in the last 8760 hours.  CBG: No results for input(s): GLUCAP in the last 168 hours.  No results found for this or any previous visit (from the past 240 hour(s)).   Studies: No results found.  Scheduled Meds:  ascorbic acid  500 mg Oral Daily   buPROPion ER  200 mg Oral BID   clotrimazole   Topical BID   enoxaparin (LOVENOX) injection  100 mg Subcutaneous Q24H   fluticasone  2 spray Each Nare BID   furosemide  40 mg Oral Daily   gabapentin  200 mg Oral TID   montelukast  10 mg Oral QHS   nystatin   Topical TID   pantoprazole  40 mg Oral Daily   rOPINIRole  4 mg Oral QHS   senna-docusate  2 tablet Oral Daily   sodium chloride flush  3 mL Intravenous Q12H   traZODone  50 mg Oral QHS   Continuous Infusions:  sodium chloride      Principal Problem:   Complicated UTI (urinary tract infection) Active Problems:   Asthma   PALPITATIONS, OCCASIONAL   Panniculitis   OSA (obstructive sleep apnea)   Major depressive disorder, recurrent episode, moderate (HCC)   Generalized anxiety disorder   Major depression, chronic   Thrombocytosis   Hypokalemia   CKD (chronic kidney disease), stage III ([place])   Consultants: Psychiatry  Procedures: None  Antibiotics: Fosfomycin x1 on 7/19, Macrodantin 7/21 and 7/22 Ertapenem x1 on 7/26 Cefepime 11/02/2027   Time spent: 35 minutes    Erin Hearing ANP  [facility] 7 am - 330 pm/M-F for direct patient care and secure chat Please refer to Amion for contact info 37  days

## 2020-12-08 NOTE — TOC Progression Note (Addendum)
Transition of Care (TOC) - Progression Note    Patient Details  Name: Heather [name] MRN: [id number] Date of Birth: 10/23/1975  Transition of Care (TOC) CM/SW Contact   R Stubbldfield, RN Phone Number: 12/08/2020, 1:38 PM  Clinical Narrative:    CM met with the patient at the bedside to discuss transitions of care to homeless shelter, since the patient is progressing well with PT/OT and recommendation from therapy is no longer SNf placement.  The patient has bariatric rolling walker in the room for ambulation is progressing well with PT/OT.  Patient was encouraged to work with therapy today and over the weekend and explained to her that she is able to mobilize better and that she will be discharged this coming week to a homeless shelter or [facility].  The patient was given homeless shelter resources to call today and over the weekend to coordinate where she will be discharging.  The patient verablized understanding and states that she plans to call [facility] to discuss availability of shelter bed.  She also states that she spoke with [facility] [phone] about possible need for bed at the facility.  The patient states that she was homeless prior to admission and that she just wants to find a shelter to work with her so that she can get her kids back from foster care.  CM spoke with the patient's pastor, Jamal Broadnax [phone] and the church was unable to assist the patient with a hotel or home accommodations at this time and he is aware that the patient is working with PT for physical progression to discharge to a shelter.  I left a message with Jessica Wright to have financial counseling placed referral to the [facility] for assistance with disability.    CM and MSW is DTP Team will continue to follow the patient for discharge to homeless shelter.  12/08/2020 1411 - CM called spoke with the patient's minister, Jamal Broadnax [phone] and  asked if the church could assist the patient by holding multiple belongings bags until the patient is able to find shelter.  I called and explained this to the patient that she would be discharged to a shelter this coming week and she would be physically unable to carry her belongings.  The patient states that she will continue to call area shelters for placement over the weekend.   Expected Discharge Plan: Skilled Nursing Facility Barriers to Discharge: Awaiting State Approval (PASRR), Homeless with medical needs (Homeless)  Expected Discharge Plan and Services Expected Discharge Plan: Skilled Nursing Facility In-house Referral: Clinical Social Work   Post Acute Care Choice: Skilled Nursing Facility (Needs LTC bed) Living arrangements for the past 2 months: Homeless Expected Discharge Date: 11/02/20                                     Social Determinants of Health (SDOH) Interventions    Readmission Risk Interventions Readmission Risk Prevention Plan 12/08/2020  Transportation Screening Complete  Medication Review (RN Care Manager) Complete  PCP or Specialist appointment within 3-5 days of discharge Complete  HRI or Home Care Consult Complete  SW Recovery Care/Counseling Consult Complete  Palliative Care Screening Not Applicable  Skilled Nursing Facility Not Applicable  Some recent data might be hidden    

## 2020-12-09 LAB — BASIC METABOLIC PANEL
Anion gap: 9 (ref 5–15)
BUN: 21 mg/dL — ABNORMAL HIGH (ref 6–20)
CO2: 26 mmol/L (ref 22–32)
Calcium: 9.3 mg/dL (ref 8.9–10.3)
Chloride: 105 mmol/L (ref 98–111)
Creatinine, Ser: 1.38 mg/dL — ABNORMAL HIGH (ref 0.44–1.00)
GFR, Estimated: 49 mL/min — ABNORMAL LOW (ref >60)
Glucose, Bld: 88 mg/dL (ref 70–99)
Potassium: 3.7 mmol/L (ref 3.5–5.1)
Sodium: 140 mmol/L (ref 135–145)

## 2020-12-09 NOTE — Progress Notes (Signed)
[facility] PROGRESS NOTE  Heather [name] [id number] DOB: [birth year] DOA: 10/30/2020 PCP: Lucianne Lei, MD  Status: Remains inpatient appropriate because:Unsafe d/c plan  Dispo:  Patient From:  Homeless-newly homeless since May 2022  Planned Disposition: Homeless shelter once can obtain bed  Medically stable for discharge: Yes  Barriers to discharge: Morbid obesity/disability pending    Level of care: Med-Surg  Code Status: Full Family Communication: Patient only DVT prophylaxis: Lovenox COVID vaccination status: Moderna x1 on 11/06/2020  and Pfizer x1 on 08/28/2020  12/09/2020: Patient seen.  No new changes.  Patient is awaiting disposition.  Serum creatinine is Gaut to baseline.  Will discontinue meloxicam.  HPI: 45 y.o. female with PMH significant for morbid obesity, DM2, obstructive sleep apnea, COPD, MDD, GAD, insomnia and RLS,renal mass,  and numerous small wounds under her pannus, upper thighs and between her legs.   Patient presented to the ED on 7/25 presented with right flank and suprapubic pain along with nausea and vomiting.   She was admitted and treated for complicated UTI with antibiotics and has completed treatment.  Currently medically stable for discharge to SNF.  Subjective: No shortness of breath or chest pain. No fever or chills. No new complaints.  Objective: Vitals:   12/09/20 0940 12/09/20 1636  BP: (!) 116/59 124/77  Pulse: 79 82  Resp: 18 18  Temp: 98.6 F (37 C) 98.9 F (37.2 C)  SpO2: 97% 94%    Intake/Output Summary (Last 24 hours) at 12/09/2020 1740 Last data filed at 12/09/2020 1300 Gross per 24 hour  Intake 970 ml  Output 0 ml  Net 970 ml    Filed Weights   11/26/20 0500 11/27/20 0546 12/03/20 0600  Weight: (!) 196 kg (!) 196 kg (!) 194 kg    Exam:  Constitutional: Patient is morbidly obese.  Patient is not in any distress. Respiratory: Clear to auscultation.   Cardiovascular: S1-S2. Abdomen: Morbidly obese, soft  and nontender.  Organs are difficult to assess. Neurologic: Patient is awake and alert.  Patient moves all extremities.   Extremities: Chronic bilateral edema.   Assessment/Plan: Acute problems: Complicated UTI presumed 2/2 Enterobacter colonization -Patient completed 3 days of cefepime, urine culture colonized,   Nonobstructing nephrolithiasis Renal cyst -CT abdomen and pelvis from 7/26 showed bilateral nonobstructing stones including a staghorn calculus on the left.  Patient does not need any procedure at this time.  Can follow-up with urology as an outpatient. -CT scan abdomen from 7/19 also showed up 8.3 cm right upper pole renal cyst likely benign.   Suicidal ideation/anxiety and depression -Psychiatry has signed off, cleared the patient for discharge.   -Continue Wellbutrin, trazodone and gabapentin.   -Continue as needed clonazepam. Outpatient follow-up with psychiatry  Right hip/back/leg pain -Patient reports that when at [facility] this year discussion was held regarding possible image guided injection of SI joint -I have discussed with IR with the following response: 2 radiologists reviewed this patient, one is IR and one is a musculoskeletal rad, they both said that because of this patient's habitus/lack of equipment here in the hospital we won't be able to do an SI injection. The IR MD's do these in CT however due to her habitus we wouldn't be able to fit her + the needle in the scanner and the MSK MDs do these in fluoro with a c-arm but we only have the fixed tables here in the hospital. You can place an order for this to be attempted at our outpatient spine center (  [facility]) but it would need to be reviewed by the radiologists there before it would be scheduled.  -Given size of leg and knee as well as current skin breakdown she is not a candidate for splint or other immobilizing device to right knee. -Back pain has increased over the past 24 hours.  Patient have discontinued  Ultram.  Resumed outpatient pain clinic medications of Topamax, meloxicam and Robaxin.  Have discontinue Neurontin in favor of Lyrica. -Gel pad ordered for bariatric chair  CKD 3B -Used to be on indapamide in the past.   --Creatinine stable with GFR less than 60 --Given need for meloxicam we will discontinue Lasix since no peripheral edema Recent Labs (within last 365 days)                  Recent Labs    10/30/20 1414 11/01/20 0350 11/02/20 0321 11/03/20 0950 11/05/20 0217 11/07/20 0521 11/11/20 0417 11/14/20 0435 11/18/20 0649 11/19/20 0501 11/21/20 0906 11/22/20 0305 11/26/20 0137 11/28/20 0541  BUN '8 13 9 8 9  '$ --  16  --  20 19  --  21* 17  --   CREATININE 1.42* 1.57* 1.46* 1.24* 1.21* 1.16* 1.22* 1.29* 1.40* 1.24* 1.40* 1.30* 1.38* 1.32*       Generalized deconditioning Homelessness -PT/OT initially recommended SNF.  History recent therapy sessions demonstrate increased independence with mobility when utilizing rolling walker. -Primary barriers to ambulation are patient's motivation and ongoing back pain which is being treated as above   Yeast panniculitis -Significantly improved. -Continue nystatin powder to all of intertriginous areas (TID) -Instructions given for staff to wash all intertriginous areas with water and pat dry before applying a light dusting of the nystatin powder   Asthma -Stable, continue current inhalers.   Morbid obesity  -Body mass index is 76.53 kg/m. Patient has been advised to make an attempt to improve diet and exercise patterns to aid in weight loss.   Generalized anxiety disorder/restless legs -Continue Wellbutrin, BuSpar, Neurontin, Requip.  Data Reviewed: Basic Metabolic Panel: Recent Labs  Lab 12/05/20 0603 12/09/20 0251  NA  --  140  K  --  3.7  CL  --  105  CO2  --  26  GLUCOSE  --  88  BUN  --  21*  CREATININE 1.25* 1.38*  CALCIUM  --  9.3    Liver Function Tests: No results for input(s): AST, ALT, ALKPHOS,  BILITOT, PROT, ALBUMIN in the last 168 hours. No results for input(s): LIPASE, AMYLASE in the last 168 hours. No results for input(s): AMMONIA in the last 168 hours. CBC: No results for input(s): WBC, NEUTROABS, HGB, HCT, MCV, PLT in the last 168 hours. Cardiac Enzymes: No results for input(s): CKTOTAL, CKMB, CKMBINDEX, TROPONINI in the last 168 hours. BNP (last 3 results) No results for input(s): BNP in the last 8760 hours.  ProBNP (last 3 results) No results for input(s): PROBNP in the last 8760 hours.  CBG: No results for input(s): GLUCAP in the last 168 hours.  No results found for this or any previous visit (from the past 240 hour(s)).   Studies: No results found.  Scheduled Meds:  ascorbic acid  500 mg Oral Daily   buPROPion ER  200 mg Oral BID   clotrimazole   Topical BID   COVID-19 mRNA Vac-TriS (Pfizer)  0.3 mL Intramuscular Once   diclofenac Sodium  4 g Topical QID   enoxaparin (LOVENOX) injection  100 mg Subcutaneous Q24H   fluticasone  2 spray Each Nare BID   gabapentin  200 mg Oral TID   methocarbamol  500 mg Oral QID   montelukast  10 mg Oral QHS   nystatin   Topical TID   pantoprazole  40 mg Oral Daily   pregabalin  75 mg Oral TID   rOPINIRole  4 mg Oral QHS   senna-docusate  2 tablet Oral Daily   sodium chloride flush  3 mL Intravenous Q12H   topiramate  25 mg Oral BID   traZODone  50 mg Oral QHS   Continuous Infusions:  sodium chloride      Principal Problem:   Complicated UTI (urinary tract infection) Active Problems:   Asthma   PALPITATIONS, OCCASIONAL   Panniculitis   OSA (obstructive sleep apnea)   Major depressive disorder, recurrent episode, moderate (HCC)   Generalized anxiety disorder   Major depression, chronic   Thrombocytosis   Hypokalemia   CKD (chronic kidney disease), stage III ([place])   Consultants: Psychiatry  Procedures: None  Antibiotics: Fosfomycin x1 on 7/19, Macrodantin 7/21 and 7/22 Ertapenem x1 on  7/26 Cefepime 11/02/2027  Time spent: 25 minutes  Bonnell Public ANP  [facility] Please refer to Amion for contact info 38  days

## 2020-12-10 NOTE — Progress Notes (Signed)
[facility] PROGRESS NOTE  Heather [name] Brannick [id number] DOB: [birth year] DOA: 10/30/2020 PCP: Lucianne Lei, MD  Status: Remains inpatient appropriate because:Unsafe d/c plan  Dispo:  Patient From:  Homeless-newly homeless since May 2022  Planned Disposition: Homeless shelter once can obtain bed  Medically stable for discharge: Yes  Barriers to discharge: Morbid obesity/disability pending    Level of care: Med-Surg  Code Status: Full Family Communication: Patient only DVT prophylaxis: Lovenox COVID vaccination status: Moderna x1 on 11/06/2020  and Pfizer x1 on 08/28/2020  12/09/2020: Patient seen.  No new changes.  Patient is awaiting disposition.  Serum creatinine is Grima to baseline.  Will discontinue meloxicam.  12/10/2020: Patient remained stable.  Continue to pursue disposition.  HPI: 45 y.o. female with PMH significant for morbid obesity, DM2, obstructive sleep apnea, COPD, MDD, GAD, insomnia and RLS,renal mass,  and numerous small wounds under her pannus, upper thighs and between her legs.   Patient presented to the ED on 7/25 presented with right flank and suprapubic pain along with nausea and vomiting.   She was admitted and treated for complicated UTI with antibiotics and has completed treatment.  Currently medically stable for discharge to SNF.  Subjective: No shortness of breath or chest pain. No fever or chills. No new complaints.  Objective: Vitals:   12/10/20 0931 12/10/20 1657  BP: (!) 125/58 122/61  Pulse: 76 79  Resp: 18 18  Temp: 97.9 F (36.6 C) 98.2 F (36.8 C)  SpO2: 95% 96%    Intake/Output Summary (Last 24 hours) at 12/10/2020 1908 Last data filed at 12/10/2020 1706 Gross per 24 hour  Intake 940 ml  Output 0 ml  Net 940 ml    Filed Weights   11/26/20 0500 11/27/20 0546 12/03/20 0600  Weight: (!) 196 kg (!) 196 kg (!) 194 kg    Exam:  Constitutional: Patient is morbidly obese.  Patient is not in any distress. Respiratory: Clear to  auscultation.   Cardiovascular: S1-S2. Abdomen: Morbidly obese, soft and nontender.  Organs are difficult to assess. Neurologic: Patient is awake and alert.  Patient moves all extremities.   Extremities: Chronic bilateral edema.   Assessment/Plan: Acute problems: Complicated UTI presumed 2/2 Enterobacter colonization -Patient completed 3 days of cefepime, urine culture colonized,   Nonobstructing nephrolithiasis Renal cyst -CT abdomen and pelvis from 7/26 showed bilateral nonobstructing stones including a staghorn calculus on the left.  Patient does not need any procedure at this time.  Can follow-up with urology as an outpatient. -CT scan abdomen from 7/19 also showed up 8.3 cm right upper pole renal cyst likely benign.   Suicidal ideation/anxiety and depression -Psychiatry has signed off, cleared the patient for discharge.   -Continue Wellbutrin, trazodone and gabapentin.   -Continue as needed clonazepam. Outpatient follow-up with psychiatry  Right hip/back/leg pain -Patient reports that when at [facility] this year discussion was held regarding possible image guided injection of SI joint -I have discussed with IR with the following response: 2 radiologists reviewed this patient, one is IR and one is a musculoskeletal rad, they both said that because of this patient's habitus/lack of equipment here in the hospital we won't be able to do an SI injection. The IR MD's do these in CT however due to her habitus we wouldn't be able to fit her + the needle in the scanner and the MSK MDs do these in fluoro with a c-arm but we only have the fixed tables here in the hospital. You can place an order  for this to be attempted at our outpatient spine center ([address]) but it would need to be reviewed by the radiologists there before it would be scheduled.  -Given size of leg and knee as well as current skin breakdown she is not a candidate for splint or other immobilizing device to right knee. -Back  pain has increased over the past 24 hours.  Patient have discontinued Ultram.  Resumed outpatient pain clinic medications of Topamax, meloxicam and Robaxin.  Have discontinue Neurontin in favor of Lyrica. -Gel pad ordered for bariatric chair  CKD 3B -Used to be on indapamide in the past.   --Creatinine stable with GFR less than 60 --Given need for meloxicam we will discontinue Lasix since no peripheral edema Recent Labs (within last 365 days)                  Recent Labs    10/30/20 1414 11/01/20 0350 11/02/20 0321 11/03/20 0950 11/05/20 0217 11/07/20 0521 11/11/20 0417 11/14/20 0435 11/18/20 0649 11/19/20 0501 11/21/20 0906 11/22/20 0305 11/26/20 0137 11/28/20 0541  BUN '8 13 9 8 9  '$ --  16  --  20 19  --  21* 17  --   CREATININE 1.42* 1.57* 1.46* 1.24* 1.21* 1.16* 1.22* 1.29* 1.40* 1.24* 1.40* 1.30* 1.38* 1.32*       Generalized deconditioning Homelessness -PT/OT initially recommended SNF.  History recent therapy sessions demonstrate increased independence with mobility when utilizing rolling walker. -Primary barriers to ambulation are patient's motivation and ongoing back pain which is being treated as above   Yeast panniculitis -Significantly improved. -Continue nystatin powder to all of intertriginous areas (TID) -Instructions given for staff to wash all intertriginous areas with water and pat dry before applying a light dusting of the nystatin powder   Asthma -Stable, continue current inhalers.   Morbid obesity  -Body mass index is 76.53 kg/m. Patient has been advised to make an attempt to improve diet and exercise patterns to aid in weight loss.   Generalized anxiety disorder/restless legs -Continue Wellbutrin, BuSpar, Neurontin, Requip.  Data Reviewed: Basic Metabolic Panel: Recent Labs  Lab 12/05/20 0603 12/09/20 0251  NA  --  140  K  --  3.7  CL  --  105  CO2  --  26  GLUCOSE  --  88  BUN  --  21*  CREATININE 1.25* 1.38*  CALCIUM  --  9.3     Liver Function Tests: No results for input(s): AST, ALT, ALKPHOS, BILITOT, PROT, ALBUMIN in the last 168 hours. No results for input(s): LIPASE, AMYLASE in the last 168 hours. No results for input(s): AMMONIA in the last 168 hours. CBC: No results for input(s): WBC, NEUTROABS, HGB, HCT, MCV, PLT in the last 168 hours. Cardiac Enzymes: No results for input(s): CKTOTAL, CKMB, CKMBINDEX, TROPONINI in the last 168 hours. BNP (last 3 results) No results for input(s): BNP in the last 8760 hours.  ProBNP (last 3 results) No results for input(s): PROBNP in the last 8760 hours.  CBG: No results for input(s): GLUCAP in the last 168 hours.  No results found for this or any previous visit (from the past 240 hour(s)).   Studies: No results found.  Scheduled Meds:  ascorbic acid  500 mg Oral Daily   buPROPion ER  200 mg Oral BID   clotrimazole   Topical BID   COVID-19 mRNA Vac-TriS (Pfizer)  0.3 mL Intramuscular Once   diclofenac Sodium  4 g Topical QID   enoxaparin (  LOVENOX) injection  100 mg Subcutaneous Q24H   fluticasone  2 spray Each Nare BID   gabapentin  200 mg Oral TID   methocarbamol  500 mg Oral QID   montelukast  10 mg Oral QHS   nystatin   Topical TID   pantoprazole  40 mg Oral Daily   pregabalin  75 mg Oral TID   rOPINIRole  4 mg Oral QHS   senna-docusate  2 tablet Oral Daily   sodium chloride flush  3 mL Intravenous Q12H   topiramate  25 mg Oral BID   traZODone  50 mg Oral QHS   Continuous Infusions:  sodium chloride      Principal Problem:   Complicated UTI (urinary tract infection) Active Problems:   Asthma   PALPITATIONS, OCCASIONAL   Panniculitis   OSA (obstructive sleep apnea)   Major depressive disorder, recurrent episode, moderate (HCC)   Generalized anxiety disorder   Major depression, chronic   Thrombocytosis   Hypokalemia   CKD (chronic kidney disease), stage III  ([place])   Consultants: Psychiatry  Procedures: None  Antibiotics: Fosfomycin x1 on 7/19, Macrodantin 7/21 and 7/22 Ertapenem x1 on 7/26 Cefepime 11/02/2027  Time spent: 25 minutes  Bonnell Public ANP  [facility] Please refer to Amion for contact info 39  days

## 2020-12-11 NOTE — Progress Notes (Signed)
[facility] PROGRESS NOTE  Heather [name] [id number] DOB: [birth year] DOA: 10/30/2020 PCP: Lucianne Lei, MD  Status: Remains inpatient appropriate because:Unsafe d/c plan  Dispo:  Patient From:  Homeless-newly homeless since May 2022  Planned Disposition: Homeless shelter once can obtain bed  Medically stable for discharge: Yes  Barriers to discharge: Morbid obesity/disability pending    Level of care: Med-Surg  Code Status: Full Family Communication: Patient only DVT prophylaxis: Lovenox COVID vaccination status: Moderna x1 on 11/06/2020  and Pfizer x1 on 08/28/2020    HPI: 45 y.o. female with PMH significant for morbid obesity, DM2, obstructive sleep apnea, COPD, MDD, GAD, insomnia and RLS,renal mass,  and numerous small wounds under her pannus, upper thighs and between her legs.   Patient presented to the ED on 7/25 presented with right flank and suprapubic pain along with nausea and vomiting.   She was admitted and treated for complicated UTI with antibiotics and has completed treatment.  Currently medically stable for discharge to SNF.  Subjective: Patient awake and alert.  Reports slight improvement in back and leg pain after resumption of preadmission medications as recommended by her outpatient pain clinic.  Reports losing the number to [facility] so was unable to follow-up regarding her disability application.  Has been calling local homeless shelters but has yet locate an open bed.  She reports concern over discharging since she states she is unable to wipe herself after going to the restroom and still has what she reports is difficulty getting up and down from chair/bed and ambulating longer distances with rolling walker.  We both discussed risks of her being discharged to the street and have agreed this is not an appropriate discharge option for her.   Objective: Vitals:   12/10/20 2029 12/11/20 0534  BP: 108/65 (!) 100/51  Pulse: 86 74  Resp: 18  15  Temp: 99 F (37.2 C) 97.7 F (36.5 C)  SpO2: 96% 98%    Intake/Output Summary (Last 24 hours) at 12/11/2020 0833 Last data filed at 12/10/2020 1706 Gross per 24 hour  Intake 940 ml  Output 0 ml  Net 940 ml   Filed Weights   11/26/20 0500 11/27/20 0546 12/03/20 0600  Weight: (!) 196 kg (!) 196 kg (!) 194 kg    Exam:  Constitutional: NAD, calm, uncomfortable secondary to back pain Respiratory: Remain clear to auscultation.  She is supine in bed without any increased work of breathing.  Room air. Cardiovascular: Heart sounds are S1-S2, she is normotensive, no peripheral edema, skin is warm and dry Abdomen: Soft and nontender.  Obese with pannus.  Bowel sounds positive. LBM 9/4, eating well Skin: Stable intertriginous and pannus Candida dermatitis Neurologic: CN 2-12 grossly intact. Sensation intact, Strength 3+-4/5 x all 4 extremities.  Psychiatric: Normal judgment and insight. Alert and oriented x 3. Normal mood.    Assessment/Plan: Acute problems: Complicated UTI presumed 2/2 Enterobacter colonization -Patient completed 3 days of cefepime, urine culture colonized,   Nonobstructing nephrolithiasis Renal cyst -CT abdomen and pelvis from 7/26 showed bilateral nonobstructing stones including a staghorn calculus on the left.  Patient does not need any procedure at this time.  Can follow-up with urology as an outpatient. -CT scan abdomen from 7/19 also showed up 8.3 cm right upper pole renal cyst likely benign.   Suicidal ideation/anxiety and depression -Psychiatry has signed off, cleared the patient for discharge.   -Continue Wellbutrin, trazodone and gabapentin.   -Continue as needed clonazepam. Outpatient follow-up with psychiatry  Right  hip/back/leg pain and patient with known disc herniation on imaging -Patient reports that when at [facility] this year discussion was held regarding possible image guided injection of SI joint -I have discussed with IR with the following  response: 2 radiologists reviewed this patient, one is IR and one is a musculoskeletal rad, they both said that because of this patient's habitus/lack of equipment here in the hospital we won't be able to do an SI injection. The IR MD's do these in CT however due to her habitus we wouldn't be able to fit her + the needle in the scanner and the MSK MDs do these in fluoro with a c-arm but we only have the fixed tables here in the hospital. You can place an order for this to be attempted at our outpatient spine center ([address]) but it would need to be reviewed by the radiologists there before it would be scheduled.  -Given size of leg and knee as well as current skin breakdown she is not a candidate for splint or other immobilizing device to right knee. -Back pain (chronic).  Better after resumption of outpatient pain medications (Topamax, meloxicam and Robaxin).  -Lyrica initiated this admission -Gel pad ordered for bariatric chair --Previous LS MR from July 2020 reveals L2-3 disc herniation with associated stenosis left; she was also found to have T12-L1 central disc herniation with upward migration.  If patient can fit in scanner we will attempt to obtain noncontrasted MRI to see if these abnormalities have progressed-this imaging may need to be done as an outpatient in the open scanner given patient's size  CKD 3B -Used to be on indapamide in the past.   --Creatinine stable with GFR less than 60 and with resumption of meloxicam creatinine stable at 1.38 with previous reading 1.25 --Given need for meloxicam we will discontinue Lasix since no peripheral edema Recent Labs (within last 365 days)                  Recent Labs    10/30/20 1414 11/01/20 0350 11/02/20 0321 11/03/20 0950 11/05/20 0217 11/07/20 0521 11/11/20 0417 11/14/20 0435 11/18/20 0649 11/19/20 0501 11/21/20 0906 11/22/20 0305 11/26/20 0137 11/28/20 0541  BUN '8 13 9 8 9  '$ --  16  --  20 19  --  21* 17  --    CREATININE 1.42* 1.57* 1.46* 1.24* 1.21* 1.16* 1.22* 1.29* 1.40* 1.24* 1.40* 1.30* 1.38* 1.32*       Generalized deconditioning Homelessness -PT/OT initially recommended SNF.  History recent therapy sessions demonstrate increased independence with mobility when utilizing rolling walker. -Primary barriers to ambulation are patient's motivation and ongoing back pain which is being treated as above   Yeast panniculitis -Significantly improved. -Continue nystatin powder to all of intertriginous areas (TID) -Instructions given for staff to wash all intertriginous areas with water and pat dry before applying a light dusting of the nystatin powder   Asthma -Stable, continue current inhalers.   Morbid obesity  -Body mass index is 76.53 kg/m. Patient has been advised to make an attempt to improve diet and exercise patterns to aid in weight loss.   Generalized anxiety disorder/restless legs -Continue Wellbutrin, BuSpar, Neurontin, Requip.      Data Reviewed: Basic Metabolic Panel: Recent Labs  Lab 12/05/20 0603 12/09/20 0251  NA  --  140  K  --  3.7  CL  --  105  CO2  --  26  GLUCOSE  --  88  BUN  --  21*  CREATININE 1.25* 1.38*  CALCIUM  --  9.3   Liver Function Tests: No results for input(s): AST, ALT, ALKPHOS, BILITOT, PROT, ALBUMIN in the last 168 hours. No results for input(s): LIPASE, AMYLASE in the last 168 hours. No results for input(s): AMMONIA in the last 168 hours. CBC: No results for input(s): WBC, NEUTROABS, HGB, HCT, MCV, PLT in the last 168 hours. Cardiac Enzymes: No results for input(s): CKTOTAL, CKMB, CKMBINDEX, TROPONINI in the last 168 hours. BNP (last 3 results) No results for input(s): BNP in the last 8760 hours.  ProBNP (last 3 results) No results for input(s): PROBNP in the last 8760 hours.  CBG: No results for input(s): GLUCAP in the last 168 hours.  No results found for this or any previous visit (from the past 240 hour(s)).   Studies: No  results found.  Scheduled Meds:  ascorbic acid  500 mg Oral Daily   buPROPion ER  200 mg Oral BID   clotrimazole   Topical BID   COVID-19 mRNA Vac-TriS (Pfizer)  0.3 mL Intramuscular Once   diclofenac Sodium  4 g Topical QID   enoxaparin (LOVENOX) injection  100 mg Subcutaneous Q24H   fluticasone  2 spray Each Nare BID   gabapentin  200 mg Oral TID   methocarbamol  500 mg Oral QID   montelukast  10 mg Oral QHS   nystatin   Topical TID   pantoprazole  40 mg Oral Daily   pregabalin  75 mg Oral TID   rOPINIRole  4 mg Oral QHS   senna-docusate  2 tablet Oral Daily   sodium chloride flush  3 mL Intravenous Q12H   topiramate  25 mg Oral BID   traZODone  50 mg Oral QHS   Continuous Infusions:  sodium chloride      Principal Problem:   Complicated UTI (urinary tract infection) Active Problems:   Asthma   PALPITATIONS, OCCASIONAL   Panniculitis   OSA (obstructive sleep apnea)   Major depressive disorder, recurrent episode, moderate (HCC)   Generalized anxiety disorder   Major depression, chronic   Thrombocytosis   Hypokalemia   CKD (chronic kidney disease), stage III ([place])   Consultants: Psychiatry  Procedures: None  Antibiotics: Fosfomycin x1 on 7/19, Macrodantin 7/21 and 7/22 Ertapenem x1 on 7/26 Cefepime 11/02/2027   Time spent: 35 minutes    Erin Hearing ANP  [facility] 7 am - 330 pm/M-F for direct patient care and secure chat Please refer to Amion for contact info 40  days

## 2020-12-11 NOTE — TOC Progression Note (Addendum)
Transition of Care [facility]) - Progression Note    Patient Details  Name: Heather [name] MRN: [id number] Date of Birth: 09-12-1975  Transition of Care [facility]) CM/SW [place], RN Phone Number: 12/11/2020, 12:36 PM  Clinical Narrative:    CM with DTP Team will continue to follow the patient for discharge to homeless shelter this week.  The patient continues to reach out to area shelters to find a one with an available bed opening.  I called and left a message with Tharon Aquas, intake coordinator with [facility] to facilitate possible placement.  The coordinator is out of the office for the day but will return in the morning at 9 am.    Left a message:  [facility] - No available bottom bunks, pt unable to climb the 16 steps at the home [employer] - left message to call patient [facility] - Left message   CM and MSW with DTP Team will continue to follow the patient for transitions of care needs to homeless shelter.   Expected Discharge Plan: Skilled Nursing Facility Barriers to Discharge: [place] ([place]), Homeless with medical needs (Homeless)  Expected Discharge Plan and Services Expected Discharge Plan: [place] In-house Referral: Clinical Social Work   Post Acute Care Choice: [place] (Needs LTC bed) Living arrangements for the past 2 months: Homeless Expected Discharge Date: 11/02/20                                     Social Determinants of Health (SDOH) Interventions    Readmission Risk Interventions Readmission Risk Prevention Plan 12/08/2020  Transportation Screening Complete  Medication Review Press photographer) Complete  PCP or Specialist appointment within 3-5 days of discharge Complete  HRI or [place] Complete  SW Recovery Care/Counseling Consult Complete  [place] Not Applicable  Some recent data might be  hidden

## 2020-12-12 ENCOUNTER — Inpatient Hospital Stay (HOSPITAL_COMMUNITY): Payer: Medicaid Other

## 2020-12-12 LAB — CREATININE, SERUM
Creatinine, Ser: 1.33 mg/dL — ABNORMAL HIGH (ref 0.44–1.00)
GFR, Estimated: 51 mL/min — ABNORMAL LOW (ref >60)

## 2020-12-12 MED ORDER — LORAZEPAM 2 MG/ML IJ SOLN
1.0000 mg | Freq: Once | INTRAMUSCULAR | Status: AC
  Administered 2020-12-12: 1 mg via INTRAVENOUS
  Filled 2020-12-12: qty 1

## 2020-12-12 NOTE — Progress Notes (Signed)
Occupational Therapy Treatment Patient Details Name: Heather [name] MRN: [id number] DOB: 02-Aug-1975 Today's Date: 12/12/2020    History of present illness 45 y.o. female presented to ED for chest pain and palpitations and feeling like her throat was closing while at her behavioral health facility for SI. Admitted 10/31/20 for treatment of complicated UTI PMH: anxiety and depression (hx of bipolar and schizoaffective disorder in chart in past), ACD, panniculitis, debility,GERD,  OSA not on cpap, chronic back pain.   OT comments  Patient seen by skilled OT to address toileting and self care. Patient was min guard to get to EOB and ambulated to bathroom with rw and min guard. Patient attempted toilet hygiene and required assistance to complete while standing. Attempted ADLs in recliner but patient had complaints of pain and discomfort from chair and asked to perform eob.  Once on eob she asked to go to supine.  Self care performed in supine with assistance for bathing back and setup.  Patient to be seen tomorrow to address self care up in chair with AE.  Follow Up Recommendations  Other (comment)    Equipment Recommendations  3 in 1 bedside commode;Tub/shower bench;Other (comment)    Recommendations for Other Services Other (comment)    Precautions / Restrictions Precautions Precautions: Fall Precaution Comments: fell prior to hosp       Mobility Bed Mobility Overal bed mobility: Needs Assistance Bed Mobility: Supine to Sit;Sit to Supine Rolling: Supervision   Supine to sit: Min guard Sit to supine: Min assist   General bed mobility comments: required assistance with BLEs    Transfers Overall transfer level: Needs assistance Equipment used: Rolling walker (2 wheeled) Transfers: Sit to/from Stand Sit to Stand: Min guard Stand pivot transfers: Min guard       General transfer comment: Used rw for transfers    Balance Overall balance assessment: Needs  assistance Sitting-balance support: No upper extremity supported;Feet supported Sitting balance-Leahy Scale: Fair Sitting balance - Comments: sits on side of bed with mattress deflated and feet on floor   Standing balance support: Bilateral upper extremity supported Standing balance-Leahy Scale: Fair Standing balance comment: Reliant on BUE on RW with functional mobility                           ADL either performed or assessed with clinical judgement   ADL Overall ADL's : Needs assistance/impaired     Grooming: Brushing hair;Bed level Grooming Details (indicate cue type and reason): setup only Upper Body Bathing: Minimal assistance;Bed level Upper Body Bathing Details (indicate cue type and reason): Assistance with back Lower Body Bathing: Moderate assistance;Bed level Lower Body Bathing Details (indicate cue type and reason): Mod A without AD. Would benefit from long-handled sponge Upper Body Dressing : Set up;Bed level Upper Body Dressing Details (indicate cue type and reason): Donned overhead gown from bed level Lower Body Dressing: Maximal assistance;Bed level Lower Body Dressing Details (indicate cue type and reason): Max assist for donning footwear at bed level due to patient stating she was rushed to use toilet Toilet Transfer: Min Fish farm manager Details (indicate cue type and reason): BSC over toilet in bathroom Toileting- Clothing Manipulation and Hygiene: Moderate assistance;Sit to/from stand Toileting - Clothing Manipulation Details (indicate cue type and reason): Required assistance to complete     Functional mobility during ADLs: Min guard;Rolling walker General ADL Comments: Self care attempted in recliner but patient complainted of back pain following toileting and asked to  complete at bedside but went supine due to discomfort.     Vision       Perception     Praxis      Cognition Arousal/Alertness: Awake/alert Behavior During  Therapy: WFL for tasks assessed/performed Overall Cognitive Status: Within Functional Limits for tasks assessed Area of Impairment: Problem solving;Safety/judgement;Attention                   Current Attention Level: Selective     Safety/Judgement: Decreased awareness of safety   Problem Solving: Slow processing General Comments: WFL for purpose of session        Exercises     Shoulder Instructions       General Comments      Pertinent Vitals/ Pain       Pain Assessment: Faces Pain Score: 4  Faces Pain Scale: Hurts little more Pain Location: low back and R hip Pain Descriptors / Indicators: Aching;Grimacing Pain Intervention(s): Repositioned  Home Living                                          Prior Functioning/Environment              Frequency  Min 2X/week        Progress Toward Goals  OT Goals(current goals can now be found in the care plan section)  Progress towards OT goals: Progressing toward goals  Acute Rehab OT Goals Patient Stated Goal: to reduce pain OT Goal Formulation: With patient Time For Goal Achievement: 12/05/20 Potential to Achieve Goals: Fair ADL Goals Pt Will Perform Grooming: with modified independence;standing Pt Will Perform Lower Body Bathing: with modified independence;sit to/from stand;sitting/lateral leans;with adaptive equipment Pt Will Perform Lower Body Dressing: with modified independence;with adaptive equipment;sitting/lateral leans;sit to/from stand Pt Will Transfer to Toilet: with modified independence;bedside commode;ambulating Pt Will Perform Toileting - Clothing Manipulation and hygiene: with modified independence;with adaptive equipment;sitting/lateral leans;sit to/from stand Pt Will Perform Tub/Shower Transfer: with modified independence;rolling walker  Plan Discharge plan needs to be updated;Frequency remains appropriate    Co-evaluation                 AM-PAC OT "6 Clicks"  Daily Activity     Outcome Measure   Help from another person eating meals?: None Help from another person taking care of personal grooming?: A Little Help from another person toileting, which includes using toliet, bedpan, or urinal?: A Lot Help from another person bathing (including washing, rinsing, drying)?: A Lot Help from another person to put on and taking off regular upper body clothing?: A Little Help from another person to put on and taking off regular lower body clothing?: A Lot 6 Click Score: 16    End of Session Equipment Utilized During Treatment: Rolling walker  OT Visit Diagnosis: Unsteadiness on feet (R26.81);Other abnormalities of gait and mobility (R26.89);Muscle weakness (generalized) (M62.81);Pain;Other (comment) Pain - Right/Left: Right Pain - part of body: Knee   Activity Tolerance Patient limited by pain   Patient Left in bed;with call bell/phone within reach;with family/visitor present   Nurse Communication Mobility status        Time: [id number] OT Time Calculation (min): 47 min  Charges: OT General Charges $OT Visit: 1 Visit OT Treatments $Self Care/Home Management : 38-52 mins  Lodema Hong, [place] 12/12/2020, 9:22 AM

## 2020-12-12 NOTE — TOC Progression Note (Addendum)
Transition of Care [facility]) - Progression Note    Patient Details  Name: Cecily Lawhorne [place] MRN: [id number] Date of Birth: 1975/11/15  Transition of Care [facility]) CM/SW Contact  Curlene Labrum, RN Phone Number: 12/12/2020, 10:24 AM  Clinical Narrative:    Case management met with the patient and her friend at the bedside to discuss discharge planning to area shelter.  The patient states that she has been unable to find an available homeless shelter at this time that has available beds in the area.  The patient was provided with an emergency shelter list to make calls to obtain housing.  The patient's minister met with the patient this weekend to hold some of her belongings at the church until she was able to find a shelter.  CM called and left message with list of shelters for housing including:  [place] - no beds and 16 stairs to navigate [employer] - left message with intake [employer] - Left message with intake to call myself and speak with patient on the phone [place] - No beds [employer] [facility] [place] - No beds [facility] [facility]  in [place] - Left message [facility] in [place] - No bed availability [employer] in [place] - Left message to have intake department call patient back [place], [place] with [facility] - left message for assistance   I spoke with Shanon Rosser, financial counselor supervisor and she will have a counselor call the patient to send a referral over to [facility] to determine eligibility for disability.  CM called and was unable to leave a message with the patient's mother, Kaylyn Lim - due to phone being disconnected.  Patient has pending MRI of back ordered but if I'm able to find available homeless shelter - patient can transfer by [facility] transport this week once I cam able to find open bed.      Expected Discharge Plan: Skilled Nursing Facility Barriers  to Discharge: [place] ([place]), Homeless with medical needs (Homeless)  Expected Discharge Plan and Services Expected Discharge Plan: [place] In-house Referral: Clinical Social Work   Post Acute Care Choice: [place] (Needs LTC bed) Living arrangements for the past 2 months: Homeless Expected Discharge Date: 11/02/20                                     Social Determinants of Health (SDOH) Interventions    Readmission Risk Interventions Readmission Risk Prevention Plan 12/08/2020  Transportation Screening Complete  Medication Review Press photographer) Complete  PCP or Specialist appointment within 3-5 days of discharge Complete  HRI or [place] Complete  SW Recovery Care/Counseling Consult Complete  [place] Not Applicable  Some recent data might be hidden

## 2020-12-12 NOTE — Progress Notes (Signed)
Physical Therapy Treatment Patient Details Name: Marguarite Hottel Abrahamsen MRN: [id number] DOB: 01/03/1976 Today's Date: 12/12/2020    History of Present Illness 45 y.o. female presented to ED for chest pain and palpitations and feeling like her throat was closing while at her behavioral health facility for SI. Admitted 10/31/20 for treatment of complicated UTI PMH: anxiety and depression (hx of bipolar and schizoaffective disorder in chart in past), ACD, panniculitis, debility,GERD,  OSA not on cpap, chronic back pain.    PT Comments    [facility] PT Session: Educated pt on [facility] program and how it will provide very [place] to the same level of rehab that she would be receiving in SNF with the goal of pt achieving independence to be able to take care of herself and ambulating limited community distances. Pt voices understanding but continues to hold hope for SNF. Bariatric lift recliner in room was unplugged and left in a stand assist position which undoubtedly was uncomfortable. PT able to plug into wall and educate pt on use for standing and sitting back in recliner. Pt able to perform with min guard. Pt also able to ambulate 50 feet with RW. Pt tearful at end of session, not wanting to go to homeless shelter because she would not be able to stay there during the day and asks if the hospital could just give her hotel vouchers. PT remarks that even if she were able to get vouchers they would eventually run out and she would need to be mobile. Pt reluctantly agrees she will need to start working.    Follow Up Recommendations  Outpatient PT (pt too high level for SNF, case management team searching for homeless shelter for patient)     Equipment Recommendations  None recommended by PT (owns bariatric RW)       Precautions / Restrictions Precautions Precautions: Fall Precaution Comments: fell prior to hosp Restrictions Weight Bearing Restrictions: No    Mobility  Bed Mobility Overal bed mobility: Needs  Assistance Bed Mobility: Supine to Sit;Sit to Supine     Supine to sit: Supervision     General bed mobility comments: increased time, use of rails    Transfers Overall transfer level: Needs assistance Equipment used: None Transfers: Sit to/from Stand Sit to Stand: Supervision         General transfer comment: used RW and bed rail with increased time and effort  Ambulation/Gait Ambulation/Gait assistance: Min guard Gait Distance (Feet): 50 Feet Assistive device:  (bariatric RW) Gait Pattern/deviations: Step-through pattern;Trunk flexed Gait velocity: pt with slowed step-through gait, widened BOS, increased trunk flexion over RW, Gait velocity interpretation: <1.8 ft/sec, indicate of risk for recurrent falls General Gait Details: pt with step-through gait,       Balance Overall balance assessment: Needs assistance Sitting-balance support: No upper extremity supported;Feet supported Sitting balance-Leahy Scale: Fair     Standing balance support: No upper extremity supported Standing balance-Leahy Scale: Fair                              Cognition Arousal/Alertness: Awake/alert Behavior During Therapy: WFL for tasks assessed/performed Overall Cognitive Status: Within Functional Limits for tasks assessed                                           General Comments General comments (skin integrity, edema, etc.): Discussed  at [employer] program with goal of more independence. Pt reports she is not ready to go to a homeless shelter that she would rather go to SNF, and PT in agreement but also states that SW has been working over a month to find SNF with no luck. Educated that [facility] is offering the same services as at a SNF and that she would have to work everyday 45 min each with PT/OT to build strength balance and endurance. As well as spending most of the day out of bed in the chair. Pt reluctantly agrees, stating it is painful. Discussed  increased medication that is being given and that it will likely be uncomfortable for a while.      Pertinent Vitals/Pain Pain Assessment: Faces Faces Pain Scale: Hurts little more Pain Location: low back and R hip Pain Descriptors / Indicators: Grimacing;Aching Pain Intervention(s): Limited activity within patient's tolerance;Monitored during session;Repositioned;Premedicated before session     PT Goals (current goals can now be found in the care plan section) Acute Rehab PT Goals Patient Stated Goal: to reduce pain PT Goal Formulation: With patient Time For Goal Achievement: 12/12/20 Potential to Achieve Goals: Fair Progress towards PT goals: Progressing toward goals    Frequency    Min 3X/week (try to progress activity tolerance to D/C to homeless shelter)      PT Plan Current plan remains appropriate       AM-PAC PT "6 Clicks" Mobility   Outcome Measure  Help needed turning from your back to your side while in a flat bed without using bedrails?: A Little Help needed moving from lying on your back to sitting on the side of a flat bed without using bedrails?: A Little Help needed moving to and from a bed to a chair (including a wheelchair)?: A Little Help needed standing up from a chair using your arms (e.g., wheelchair or bedside chair)?: A Little Help needed to walk in hospital room?: A Little Help needed climbing 3-5 steps with a railing? : A Lot 6 Click Score: 17    End of Session   Activity Tolerance: Patient limited by pain Patient left: with call bell/phone within reach;in chair Nurse Communication: Mobility status PT Visit Diagnosis: Unsteadiness on feet (R26.81);Muscle weakness (generalized) (M62.81);History of falling (Z91.81) Pain - Right/Left: Right Pain - part of body: Hip (low back)     Time: 1510-1605 PT Time Calculation (min) (ACUTE ONLY): 55 min  Charges:  $Gait Training: 8-22 mins $Therapeutic Activity: 23-37 mins                      Maizy Davanzo B. Migdalia Dk PT, DPT Acute Rehabilitation Services Pager [phone] Office [phone]    [place] 12/12/2020, 4:44 PM

## 2020-12-12 NOTE — Progress Notes (Signed)
[facility] PROGRESS NOTE  Heather [name] [id number] DOB: 1975-12-14 DOA: 10/30/2020 PCP: Lucianne Lei, MD  Status: Remains inpatient appropriate because:Unsafe d/c plan  Dispo:  Patient From:  Homeless-newly homeless since May 2022  Planned Disposition: Homeless shelter once can obtain bed  Medically stable for discharge: Yes  Barriers to discharge: Morbid obesity/disability pending    Level of care: Med-Surg  Code Status: Full Family Communication: Patient only DVT prophylaxis: Lovenox COVID vaccination status: Moderna x1 on 11/06/2020  and Pfizer x1 on 08/28/2020    HPI: 45 y.o. female with PMH significant for morbid obesity, DM2, obstructive sleep apnea, COPD, MDD, GAD, insomnia and RLS,renal mass,  and numerous small wounds under her pannus, upper thighs and between her legs.   Patient presented to the ED on 7/25 presented with right flank and suprapubic pain along with nausea and vomiting.   She was admitted and treated for complicated UTI with antibiotics and has completed treatment.  Currently medically stable for discharge to SNF.  Subjective: Awake laying flat in bed with significant other laying next to her in the bed.  Patient made aware that since it had been 2 years since LS spine MRI had been completed that I ordered another MRI for surveillance.  This should be helpful regarding her disability application.  She was also informed that it would be her responsibility to get this information as well as her unemployment information to disability.  Case manager at bedside and updated patient that we are still actively trying to find her homeless shelter bed and she needs to continue her regular phone calls to the shelters as well.  She has requested a copy of the renal imaging so she can submit to urologist after discharge   Objective: Vitals:   12/12/20 0621 12/12/20 0945  BP: 114/62 110/60  Pulse: 63 80  Resp: 18   Temp: 98.4 F (36.9 C) 97.8 F (36.6  C)  SpO2: 99% 97%    Intake/Output Summary (Last 24 hours) at 12/12/2020 1257 Last data filed at 12/11/2020 2100 Gross per 24 hour  Intake 480 ml  Output 0 ml  Net 480 ml   Filed Weights   11/26/20 0500 11/27/20 0546 12/03/20 0600  Weight: (!) 196 kg (!) 196 kg (!) 194 kg    Exam:  Constitutional: NAD, calm, in no apparent distress Respiratory: Clear to auscultation and stable on room air.  No increased work of breathing Cardiovascular: Heart sounds are S1-S2, she is normotensive, no peripheral edema, skin is warm and dry Abdomen: Soft and nontender.  Obese with pannus.  Bowel sounds positive. LBM 9/4, eating well Skin: Stable intertriginous and pannus Candida dermatitis Neurologic: CN 2-12 grossly intact. Sensation intact, Strength 3+-4/5 x all 4 extremities.  Psychiatric: Normal judgment and insight. Alert and oriented x 3. Normal mood.    Assessment/Plan: Acute problems: Complicated UTI presumed 2/2 Enterobacter colonization -Patient completed 3 days of cefepime, urine culture colonized,   Nonobstructing nephrolithiasis Renal cyst -CT abdomen and pelvis from 7/26 showed bilateral nonobstructing stones including a staghorn calculus on the left.  Can follow-up with urology as an outpatient. -CT scan abdomen from 7/19 also showed up 8.3 cm right upper pole renal cyst likely benign.   Suicidal ideation/anxiety and depression -Psychiatry has cleared the patient for discharge.   -Continue Wellbutrin, trazodone, gabapentin and prn clonazepam -Outpatient follow-up with psychiatry  Right hip/back/leg pain and patient with known disc herniation on imaging -Discussed with IR regarding possible image guided injection of SI  joint: 2 radiologists reviewed this patient, one is IR and one is a musculoskeletal rad, they both said that because of this patient's habitus/lack of equipment here in the hospital we won't be able to do an SI injection. The IR MD's do these in CT however due to  her habitus we wouldn't be able to fit her + the needle in the scanner and the MSK MDs do these in fluoro with a c-arm but we only have the fixed tables here in the hospital. You can place an order for this to be attempted at our outpatient spine center ([address]) but it would need to be reviewed by the radiologists there before it would be scheduled.  -Given size of leg and knee as well as current skin breakdown she is not a candidate for splint or other immobilizing device to right knee. -Back pain (chronic).  Better after resumption of outpatient pain medications (Topamax, meloxicam and Robaxin).  -Lyrica initiated this admission -Gel pad ordered for bariatric chair --Previous LS MR from July 2020 reveals L2-3 disc herniation with associated stenosis left; she was also found to have T12-L1 central disc herniation with upward migration.  If patient can fit in scanner we will attempt to obtain noncontrasted MRI to see if these abnormalities have progressed-this imaging may need to be done as an outpatient in the open scanner given patient's size  CKD 3B -Used to be on indapamide in the past.   --Creatinine stable with GFR less than 60 and with resumption of meloxicam creatinine stable at 1.38 with previous reading 1.25 --Given need for meloxicam we will discontinue Lasix since no peripheral edema Recent Labs (within last 365 days)                  Recent Labs    10/30/20 1414 11/01/20 0350 11/02/20 0321 11/03/20 0950 11/05/20 0217 11/07/20 0521 11/11/20 0417 11/14/20 0435 11/18/20 0649 11/19/20 0501 11/21/20 0906 11/22/20 0305 11/26/20 0137 11/28/20 0541  BUN '8 13 9 8 9  '$ --  16  --  20 19  --  21* 17  --   CREATININE 1.42* 1.57* 1.46* 1.24* 1.21* 1.16* 1.22* 1.29* 1.40* 1.24* 1.40* 1.30* 1.38* 1.32*       Generalized deconditioning Homelessness -PT/OT initially recommended SNF.  History recent therapy sessions demonstrate increased independence with mobility when utilizing  rolling walker. -Primary barriers to ambulation are patient's motivation and ongoing back pain which is being treated as above   Yeast panniculitis -Significantly improved. -Continue nystatin powder to all of intertriginous areas (TID) -Instructions given for staff to wash all intertriginous areas with water and pat dry before applying a light dusting of the nystatin powder   Asthma -Stable, continue current inhalers.   Morbid obesity  -Body mass index is 76.53 kg/m. Patient has been advised to make an attempt to improve diet and exercise patterns to aid in weight loss.   Generalized anxiety disorder/restless legs -Continue Wellbutrin, BuSpar, Neurontin, Requip.      Data Reviewed: Basic Metabolic Panel: Recent Labs  Lab 12/09/20 0251 12/12/20 0152  NA 140  --   K 3.7  --   CL 105  --   CO2 26  --   GLUCOSE 88  --   BUN 21*  --   CREATININE 1.38* 1.33*  CALCIUM 9.3  --    Liver Function Tests: No results for input(s): AST, ALT, ALKPHOS, BILITOT, PROT, ALBUMIN in the last 168 hours. No results for input(s): LIPASE, AMYLASE  in the last 168 hours. No results for input(s): AMMONIA in the last 168 hours. CBC: No results for input(s): WBC, NEUTROABS, HGB, HCT, MCV, PLT in the last 168 hours. Cardiac Enzymes: No results for input(s): CKTOTAL, CKMB, CKMBINDEX, TROPONINI in the last 168 hours. BNP (last 3 results) No results for input(s): BNP in the last 8760 hours.  ProBNP (last 3 results) No results for input(s): PROBNP in the last 8760 hours.  CBG: No results for input(s): GLUCAP in the last 168 hours.  No results found for this or any previous visit (from the past 240 hour(s)).   Studies: No results found.  Scheduled Meds:  ascorbic acid  500 mg Oral Daily   buPROPion ER  200 mg Oral BID   clotrimazole   Topical BID   COVID-19 mRNA Vac-TriS (Pfizer)  0.3 mL Intramuscular Once   diclofenac Sodium  4 g Topical QID   enoxaparin (LOVENOX) injection  100 mg  Subcutaneous Q24H   fluticasone  2 spray Each Nare BID   gabapentin  200 mg Oral TID   methocarbamol  500 mg Oral QID   montelukast  10 mg Oral QHS   nystatin   Topical TID   pantoprazole  40 mg Oral Daily   pregabalin  75 mg Oral TID   rOPINIRole  4 mg Oral QHS   senna-docusate  2 tablet Oral Daily   sodium chloride flush  3 mL Intravenous Q12H   topiramate  25 mg Oral BID   traZODone  50 mg Oral QHS   Continuous Infusions:  sodium chloride      Principal Problem:   Complicated UTI (urinary tract infection) Active Problems:   Asthma   PALPITATIONS, OCCASIONAL   Panniculitis   OSA (obstructive sleep apnea)   Major depressive disorder, recurrent episode, moderate (HCC)   Generalized anxiety disorder   Major depression, chronic   Thrombocytosis   Hypokalemia   CKD (chronic kidney disease), stage III ([place])   Consultants: Psychiatry  Procedures: None  Antibiotics: Fosfomycin x1 on 7/19, Macrodantin 7/21 and 7/22 Ertapenem x1 on 7/26 Cefepime 11/02/2027   Time spent: 35 minutes    Erin Hearing ANP  [facility] 7 am - 330 pm/M-F for direct patient care and secure chat Please refer to Amion for contact info 41  days

## 2020-12-13 ENCOUNTER — Ambulatory Visit (HOSPITAL_COMMUNITY): Payer: Medicaid Other | Admitting: Licensed Clinical Social Worker

## 2020-12-13 NOTE — NC FL2 (Signed)
[place] LEVEL OF CARE SCREENING TOOL     IDENTIFICATION  Patient Name: Heather [name] Birthdate: 14-Apr-1975 Sex: female Admission Date (Current Location): 10/30/2020  [place] and [place] Number:  Heather [name] [id number] [place] and Address:  [facility] [place], [place] [address], [place], [place] [address]      Provider Number: [id number]  Attending Physician Name and Address:  Antonieta Pert, MD  Relative Name and Phone Number:       Current Level of Care: Hospital Recommended Level of Care: [place], [facility] Prior Approval Number:    Date Approved/Denied:   PASRR Number: [id number] F - Eff. 8/11 - 12/16/20  Discharge Plan: Other (Comment) (ALF)    Current Diagnoses: Patient Active Problem List   Diagnosis Date Noted   Complicated UTI (urinary tract infection) 10/31/2020   Thrombocytosis 10/31/2020   Hypokalemia 10/31/2020   CKD (chronic kidney disease), stage III ([place]) 10/31/2020   Adjustment disorder with mixed anxiety and depressed mood 10/27/2020   Major depression, chronic 09/13/2020   Insomnia    Anemia of chronic disease    Hyponatremia    AKI (acute kidney injury) ([place])    Chronic bilateral low back pain without sciatica    Debility 08/24/2020   SIRS (systemic inflammatory response syndrome) ([place]) 08/18/2020   Pressure injury of skin 08/11/2020   Rhabdomyolysis 08/10/2020   Acute renal failure ([place]) 08/10/2020   PTSD (post-traumatic stress disorder) 05/15/2020   Major depressive disorder, recurrent episode, moderate ([place]) 05/05/2020   Generalized anxiety disorder 05/05/2020   Blood in stool    Gastritis and gastroduodenitis    Benign neoplasm of sigmoid colon    Difficult intubation 08/10/2018   Class 3 severe obesity with serious comorbidity and body mass index (BMI) greater than or equal to 70 in adult ([place]) 06/16/2018   Nexplanon insertion 03/27/2017   Postpartum hypertension 03/05/2017   Status  post primary low transverse cesarean section 02/03/2017   H/O pre-eclampsia 01/27/2017   Gestational htn w/o significant proteinuria, third trimester 01/20/2017   Mild persistent asthma without complication [id number]   Perennial allergic rhinitis 11/05/2016   Mild persistent asthma with acute exacerbation 11/05/2016   Excess weight gain in pregnancy, second trimester 10/30/2016   Hard to intubate 11/07/2015   Hypoglycemia 11/07/2015   OSA (obstructive sleep apnea) 11/07/2015   DM type 2 (diabetes mellitus, type 2) ([place]) 12/07/2014   Panniculitis 12/06/2014   Cellulitis, abdominal wall 11/11/2014   Abdominal pain 07/14/2014   Loose stools 07/14/2014   Melena [id number]   Eosinophilic esophagitis [id number]   Change in bowel habits 04/28/2013   Esophageal dysphagia 04/28/2013   Insomnia due to mental disorder(327.02) 08/08/2011   RLS (restless legs syndrome) 08/08/2011   PALPITATIONS, OCCASIONAL 11/01/2009   Leucocytosis 07/28/2008   ALLERGIC RHINITIS, SEASONAL [id number]   DYSMETABOLIC SYNDROME [id number]   Morbid obesity ([place]) 05/13/2006   EXTERNAL HEMORRHOIDS 05/13/2006   HYPERLIPIDEMIA 05/12/2006   Essential hypertension 05/12/2006   Asthma 05/12/2006   OSTEOARTHRITIS 05/12/2006    Orientation RESPIRATION BLADDER Height & Weight     Self, Time, Situation, Place  Normal Continent Weight: (!) 427 lb 11.1 oz (194 kg) Height:  '5\' 3"'$  (160 cm)  BEHAVIORAL SYMPTOMS/MOOD NEUROLOGICAL BOWEL NUTRITION STATUS      Continent Diet (Regular)  AMBULATORY STATUS COMMUNICATION OF NEEDS Skin   Independent Verbally Other (Comment) (Moisture Associated Skin Damage to left posterior knee and lower right abdomen; Ecchymosis bilateral abdomen)  Personal Care Assistance Level of Assistance  Bathing, Feeding, Dressing Bathing Assistance: Limited assistance Feeding assistance: Independent Dressing Assistance: Limited assistance     Functional Limitations Info   Sight, Hearing, Speech Sight Info: Adequate Hearing Info: Adequate Speech Info: Adequate    SPECIAL CARE FACTORS FREQUENCY  PT (By licensed PT), OT (By licensed OT)     PT Frequency: Outpatient OT Frequency: Outpatient            Contractures Contractures Info: Not present    Additional Factors Info  Code Status, Allergies Code Status Info: Full Allergies Info: Amoxicillin. Bee Venom, Penicillins, Penicillin G, Adhesive (Tape), Latex, Vancomycin           Current Medications (12/13/2020):  This is the current hospital active medication list Current Facility-Administered Medications  Medication Dose Route Frequency Provider Last Rate Last Admin   0.9 %  sodium chloride infusion  250 mL Intravenous PRN Orma Flaming, MD       acetaminophen (TYLENOL) tablet 650 mg  650 mg Oral Q6H PRN Orma Flaming, MD   650 mg at 12/08/20 0600   Or   acetaminophen (TYLENOL) suppository 650 mg  650 mg Rectal Q6H PRN Orma Flaming, MD       albuterol (VENTOLIN HFA) 108 (90 Base) MCG/ACT inhaler 2 puff  2 puff Inhalation Q4H PRN Orma Flaming, MD   2 puff at 11/11/20 1842   ascorbic acid (VITAMIN C) tablet 500 mg  500 mg Oral Daily Orma Flaming, MD   500 mg at 12/13/20 [id number]   buPROPion ER (WELLBUTRIN SR) 12 hr tablet 200 mg  200 mg Oral BID Orma Flaming, MD   200 mg at 12/13/20 [id number]   clonazePAM (KLONOPIN) disintegrating tablet 0.25 mg  0.25 mg Oral BID PRN Debbe Odea, MD   0.25 mg at 12/10/20 2132   clotrimazole (LOTRIMIN) 1 % cream   Topical BID Orma Flaming, MD   Given at 12/13/20 0941   COVID-19 mRNA Vac-TriS (Pfizer) injection 0.3 mL  0.3 mL Intramuscular Once Samella Parr, NP       diclofenac Sodium (VOLTAREN) 1 % topical gel 4 g  4 g Topical QID Samella Parr, NP   4 g at 12/12/20 2111   enoxaparin (LOVENOX) injection 100 mg  100 mg Subcutaneous Q24H Orma Flaming, MD   100 mg at 12/13/20 0938   fluticasone (FLONASE) 50 MCG/ACT nasal spray 2 spray  2 spray Each Nare  BID Orma Flaming, MD   2 spray at 12/12/20 2111   gabapentin (NEURONTIN) capsule 200 mg  200 mg Oral TID Damita Dunnings B, MD   200 mg at 12/13/20 [id number]   methocarbamol (ROBAXIN) tablet 500 mg  500 mg Oral QID Samella Parr, NP   500 mg at 12/13/20 0938   montelukast (SINGULAIR) tablet 10 mg  10 mg Oral QHS Orma Flaming, MD   10 mg at 12/12/20 2110   nitroGLYCERIN (NITROSTAT) SL tablet 0.4 mg  0.4 mg Sublingual Q5 min PRN Shelly Coss, MD   0.4 mg at 11/11/20 1854   nystatin (MYCOSTATIN/NYSTOP) topical powder   Topical TID Samella Parr, NP   Given at 12/13/20 0941   ondansetron (ZOFRAN) tablet 4 mg  4 mg Oral Q6H PRN Orma Flaming, MD   4 mg at 11/22/20 0951   Or   ondansetron (ZOFRAN) injection 4 mg  4 mg Intravenous Q6H PRN Orma Flaming, MD   4 mg at 11/03/20 0843   pantoprazole (PROTONIX) EC tablet 40  mg  40 mg Oral Daily Shelly Coss, MD   40 mg at 12/13/20 0937   pregabalin (LYRICA) capsule 75 mg  75 mg Oral TID Samella Parr, NP   75 mg at 12/13/20 [id number]   rOPINIRole (REQUIP) tablet 4 mg  4 mg Oral QHS Orma Flaming, MD   4 mg at 12/12/20 2110   senna-docusate (Senokot-S) tablet 1 tablet  1 tablet Oral QHS PRN Orma Flaming, MD       senna-docusate (Senokot-S) tablet 2 tablet  2 tablet Oral Daily Orma Flaming, MD   2 tablet at 12/13/20 [id number]   sodium chloride flush (NS) 0.9 % injection 3 mL  3 mL Intravenous Q12H Orma Flaming, MD   3 mL at 12/12/20 2114   sodium chloride flush (NS) 0.9 % injection 3 mL  3 mL Intravenous PRN Orma Flaming, MD   3 mL at 11/29/20 2252   topiramate (TOPAMAX) tablet 25 mg  25 mg Oral BID Samella Parr, NP   25 mg at 12/13/20 [id number]   traZODone (DESYREL) tablet 50 mg  50 mg Oral QHS Damita Dunnings B, MD   50 mg at 12/12/20 2110   witch hazel-glycerin (TUCKS) pad   Topical PRN Shelly Coss, MD         Discharge Medications: Please see discharge summary for a list of discharge medications.  Relevant Imaging Results:  Relevant Lab  Results:   Additional Information ss#[id number]. Had had 1st pfizer vaccone and is requesting to get the 2nd vaccination. Patient is 427 pounds  [employer], [place]

## 2020-12-13 NOTE — Progress Notes (Addendum)
[facility] PROGRESS NOTE  Heather [name] Haslam [id number] DOB: 1975-11-13 DOA: 10/30/2020 PCP: Lucianne Lei, MD  Status: Remains inpatient appropriate because:Unsafe d/c plan  Dispo:  Patient From:  Homeless-newly homeless since May 2022  Planned Disposition: ALF??  Medically stable for discharge: Yes  Barriers to discharge: Morbid obesity/disability pending    Level of care: Med-Surg  Code Status: Full Family Communication: Patient only DVT prophylaxis: Lovenox COVID vaccination status: Moderna x1 on 11/06/2020  and Pfizer x1 on 08/28/2020    HPI: 45 y.o. female with PMH significant for morbid obesity, DM2, obstructive sleep apnea, COPD, MDD, GAD, insomnia and RLS,renal mass,  and numerous small wounds under her pannus, upper thighs and between her legs.   Patient presented to the ED on 7/25 presented with right flank and suprapubic pain along with nausea and vomiting.   She was admitted and treated for complicated UTI with antibiotics and has completed treatment.  Currently medically stable for discharge to SNF.  Subjective: Sitting up in bariatric chair upon my entry into the room.  Complaining that it is hurting her back.  Has not yet received a gel pad for chair.  Discussed MRI findings as well as change in discharge disposition.  Patient agrees that homeless on the streets and homeless shelter would not be a appropriate or long-term option for her.  She is aware we are reviewing other options.  Upon my entry into the room she was also talking to a DSS representative regarding her disability application.   Objective: Vitals:   12/12/20 2131 12/13/20 0442  BP: 102/86 121/61  Pulse: 87 66  Resp: 18 17  Temp: 98.5 F (36.9 C) (!) 97.5 F (36.4 C)  SpO2: 96% 100%    Intake/Output Summary (Last 24 hours) at 12/13/2020 0754 Last data filed at 12/13/2020 0522 Gross per 24 hour  Intake 660 ml  Output 601 ml  Net 59 ml   Filed Weights   11/26/20 0500 11/27/20 0546  12/03/20 0600  Weight: (!) 196 kg (!) 196 kg (!) 194 kg    Exam:  Constitutional: NAD, calm, reports back pain while up in chair Respiratory: CTA w/o increased WOB, normal 02 sats Cardiovascular: Heart sounds are S1-S2, normotensive, no peripheral edema, skin is warm and dry w/ excellent capillary refill Abdomen: Soft and nontender.  Obese with pannus.  Bowel sounds positive. LBM 9/6, eating well Skin: Stable intertriginous and pannus Candida dermatitis Neurologic: CN 2-12 grossly intact. Sensation intact, Strength 3+-4/5 x all 4 extremities.  Psychiatric: Normal judgment and insight. Alert and oriented x 3. Normal mood.    Assessment/Plan: Acute problems: Complicated UTI presumed 2/2 Enterobacter colonization -Patient completed 3 days of cefepime, urine culture colonized,   Nonobstructing nephrolithiasis Renal cyst -CT abdomen and pelvis from 7/26 showed bilateral nonobstructing stones including a staghorn calculus on the left.  Can follow-up with urology as an outpatient. -CT scan abdomen from 7/19 also showed up 8.3 cm right upper pole renal cyst likely benign.   Suicidal ideation/anxiety and depression -Psychiatry has cleared the patient for discharge.   -Continue Wellbutrin, trazodone, gabapentin and prn clonazepam -Outpatient follow-up with psychiatry  Suspected OSA/OHS -Monitor nocturnal oximetry to determine if patient is having desaturations while sleeping  Right hip/back/leg pain and patient with known disc herniation on imaging -Discussed with IR regarding possible image guided injection of SI joint: 2 radiologists reviewed this patient, one is IR and one is a musculoskeletal rad, they both said that because of this patient's habitus/lack of equipment  here in the hospital we won't be able to do an SI injection. The IR MD's do these in CT however due to her habitus we wouldn't be able to fit her + the needle in the scanner and the MSK MDs do these in fluoro with a c-arm  but we only have the fixed tables here in the hospital. You can place an order for this to be attempted at our outpatient spine center ([address]) but it would need to be reviewed by the radiologists there before it would be scheduled.  -Given size of leg and knee as well as current skin breakdown she is not a candidate for splint or other immobilizing device to right knee. -Back pain (chronic).  Better after resumption of outpatient pain medications (Topamax, meloxicam and Robaxin).  -Lyrica initiated this admission -Gel pad ordered for bariatric chair --Previous LS MR from July 2020 w/L2-3 disc herniation with associated stenosis left; she was also found to have T12-L1 central disc herniation with upward migration.  Repeat MRI this admission essentially unchanged.  CKD 3B -Used to be on indapamide in the past.   --Creatinine stable with GFR less than 60 and with resumption of meloxicam creatinine stable at 1.38 with previous reading 1.25 --Given need for meloxicam we will discontinue Lasix since no peripheral edema   Generalized deconditioning Homelessness -PT/OT initially recommended SNF.  History recent therapy sessions demonstrate increased independence with mobility when utilizing rolling walker. -Primary barriers to ambulation are patient's motivation and ongoing back pain which is being treated as above   Yeast panniculitis -Significantly improved. -Continue nystatin powder to all of intertriginous areas (TID) -Instructions given for staff to wash all intertriginous areas with water and pat dry before applying a light dusting of the nystatin powder   Asthma -Stable, continue current inhalers.   Morbid obesity  -Body mass index is 76.53 kg/m. Patient has been advised to make an attempt to improve diet and exercise patterns to aid in weight loss.   Generalized anxiety disorder/restless legs -Continue Wellbutrin, BuSpar, Neurontin, Requip.      Data Reviewed: Basic  Metabolic Panel: Recent Labs  Lab 12/09/20 0251 12/12/20 0152  NA 140  --   K 3.7  --   CL 105  --   CO2 26  --   GLUCOSE 88  --   BUN 21*  --   CREATININE 1.38* 1.33*  CALCIUM 9.3  --     MR LUMBAR SPINE WO CONTRAST  Result Date: 12/12/2020 CLINICAL DATA:  Low back pain, known prior T12 and lumbar disc herniation EXAM: MRI LUMBAR SPINE WITHOUT CONTRAST TECHNIQUE: Multiplanar, multisequence MR imaging of the lumbar spine was performed. No intravenous contrast was administered. COMPARISON:  Lumbar spine MRI 11/04/2018, CT lumbar spine 08/18/2020 FINDINGS: Segmentation:  Standard. Alignment: Grade 1 anterolisthesis of L5 on S1 is unchanged. Alignment is otherwise normal. Vertebrae: Vertebral body heights are preserved. There is no evidence of acute fracture. Marrow signal is diffusely hypointense on T1 and T2 images. There is mild degenerative endplate signal abnormality at L4-L5 and L5-S1. There is a suspected unilateral right L5-S1 pars defect as seen on prior CT. Conus medullaris and cauda equina: Conus extends to the mid L1 level. Conus and cauda equina appear normal. Paraspinal and other soft tissues: There is a 7.8 cm right renal cyst with complex internal septations. This is not significantly changed in size since 2019. The soft tissues are otherwise unremarkable. Disc levels: There is multilevel facet arthropathy, most advanced at  L5-S1 with prominent bilateral effusions, increased since 2020. There is multilevel disc desiccation and narrowing, most advanced at L4-L5. T12-L1: There is a right paracentral disc extrusion with superior migration without significant spinal canal or neural foraminal stenosis. The extrusion is decreased in size since 2020. L1-L2: There is a mild disc bulge and bilateral facet arthropathy without significant spinal canal or neural foraminal stenosis. L2-L3: There is a diffuse disc bulge with a left subarticular zone inferiorly migrated extrusion and bilateral facet  arthropathy resulting in moderate to severe spinal canal stenosis with probable impingement of the traversing left cauda equina nerve roots. These findings are not significantly changed since 2020. L3-L4: There is a shallow disc bulge and bilateral facet arthropathy without significant spinal canal or neural foraminal stenosis. L4-L5: There is a mild disc bulge, degenerative endplate change, and bilateral facet arthropathy resulting in mild left worse than right neural foraminal stenosis without significant spinal canal stenosis. L5-S1: There is degenerative endplate change and bilateral facet arthropathy superimposed on grade 1 anterolisthesis resulting in severe bilateral neural foraminal stenosis without significant spinal canal stenosis, unchanged. IMPRESSION: 1. Left subarticular zone disc extrusion at L2-L3 resulting in moderate to severe spinal canal stenosis with impingement of the traversing left cauda equina nerve roots, unchanged since 2020. 2. Decreased size of the superiorly migrated disc extrusion at T12-L1. No significant spinal canal or neural foraminal stenosis at this level. 3. Severe bilateral neural foraminal stenosis at L5-S1, unchanged. 4. Advanced bilateral facet arthropathy at L5-S1 with prominent effusions, increased since 2020. 5. Diffusely abnormal T1 hypointensity throughout the bone marrow is unchanged going back to 2019. This is nonspecific and can be seen in the setting of chronic hypoxia (including smoking), anemia, or less likely lymphoproliferative disorder given chronicity. 6. No significant interval change in size of the complex right renal mass dating back to 2019. Slow-growing neoplasm is still not excluded. Electronically Signed   By: Valetta Mole M.D.   On: 12/12/2020 15:01    Scheduled Meds:  ascorbic acid  500 mg Oral Daily   buPROPion ER  200 mg Oral BID   clotrimazole   Topical BID   COVID-19 mRNA Vac-TriS (Pfizer)  0.3 mL Intramuscular Once   diclofenac Sodium  4 g  Topical QID   enoxaparin (LOVENOX) injection  100 mg Subcutaneous Q24H   fluticasone  2 spray Each Nare BID   gabapentin  200 mg Oral TID   methocarbamol  500 mg Oral QID   montelukast  10 mg Oral QHS   nystatin   Topical TID   pantoprazole  40 mg Oral Daily   pregabalin  75 mg Oral TID   rOPINIRole  4 mg Oral QHS   senna-docusate  2 tablet Oral Daily   sodium chloride flush  3 mL Intravenous Q12H   topiramate  25 mg Oral BID   traZODone  50 mg Oral QHS   Continuous Infusions:  sodium chloride      Principal Problem:   Complicated UTI (urinary tract infection) Active Problems:   Asthma   PALPITATIONS, OCCASIONAL   Panniculitis   OSA (obstructive sleep apnea)   Major depressive disorder, recurrent episode, moderate (HCC)   Generalized anxiety disorder   Major depression, chronic   Thrombocytosis   Hypokalemia   CKD (chronic kidney disease), stage III ([place])   Consultants: Psychiatry  Procedures: None  Antibiotics: Fosfomycin x1 on 7/19, Macrodantin 7/21 and 7/22 Ertapenem x1 on 7/26 Cefepime 11/02/2027   Time spent: 25 minutes  Erin Hearing ANP  [facility] 7 am - 330 pm/M-F for direct patient care and secure chat Please refer to Amion for contact info 42  days

## 2020-12-13 NOTE — Progress Notes (Signed)
Occupational Therapy Treatment Patient Details Name: Heather [name] MRN: [id number] DOB: Sep 06, 1975 Today's Date: 12/13/2020    History of present illness 45 y.o. female presented to ED for chest pain and palpitations and feeling like her throat was closing while at her behavioral health facility for SI. Admitted 10/31/20 for treatment of complicated UTI PMH: anxiety and depression (hx of bipolar and schizoaffective disorder in chart in past), ACD, panniculitis, debility,GERD,  OSA not on cpap, chronic back pain.   OT comments  Patient seen by skilled OT to address self care to increase functional independence.  Patient was supervision for getting to eob with extra time and rail use. Patient was supervision to ambulate to bathroom.  Min guard to transfer into shower and sat on 3n1 in shower with assistance for back and feet. Dressing performed in recliner with assistance for footwear.  Patient remained in recliner. Acute OT to continue to follow.   Follow Up Recommendations       Equipment Recommendations  3 in 1 bedside commode;Tub/shower bench;Other (comment)    Recommendations for Other Services      Precautions / Restrictions Precautions Precautions: Fall Precaution Comments: fell prior to hosp       Mobility Bed Mobility Overal bed mobility: Needs Assistance Bed Mobility: Supine to Sit     Supine to sit: Supervision     General bed mobility comments: increased time, use of rails    Transfers Overall transfer level: Needs assistance Equipment used: Rolling walker (2 wheeled) Transfers: Sit to/from Stand Sit to Stand: Supervision Stand pivot transfers: Min guard       General transfer comment: used rails in bathroom and shower    Balance Overall balance assessment: Needs assistance Sitting-balance support: No upper extremity supported;Feet supported Sitting balance-Leahy Scale: Fair     Standing balance support: No upper extremity supported;During  functional activity Standing balance-Leahy Scale: Fair Standing balance comment: Reliant on BUE on RW with functional mobility                           ADL either performed or assessed with clinical judgement   ADL Overall ADL's : Needs assistance/impaired     Grooming: Brushing hair;Sitting;Set up Grooming Details (indicate cue type and reason): setup only Upper Body Bathing: Minimal assistance;Sitting Upper Body Bathing Details (indicate cue type and reason): Assistance with back Lower Body Bathing: Minimal assistance;Sitting/lateral leans;Sit to/from stand Lower Body Bathing Details (indicate cue type and reason): Assistance with feet, would benefit from [facility] sponge Upper Body Dressing : Set up;Sitting Upper Body Dressing Details (indicate cue type and reason): Donned overhead gown/dress seated in chair Lower Body Dressing: Maximal assistance;Sitting/lateral leans Lower Body Dressing Details (indicate cue type and reason): max assist for donning footwear Toilet Transfer: Supervision/safety;RW Toilet Transfer Details (indicate cue type and reason): BSC over toilet in bathroom Toileting- Clothing Manipulation and Hygiene: Set up;Sitting/lateral lean Toileting - Clothing Manipulation Details (indicate cue type and reason): patient performed perineal cleaning Tub/ Shower Transfer: Walk-in shower;Min guard;Cueing for safety;3 in 1 Tub/Shower Transfer Details (indicate cue type and reason): min guard for walk in shower with small ledge to step over Functional mobility during ADLs: Min guard;Rolling walker General ADL Comments: Patient was min guard following shower     Vision       Perception     Praxis      Cognition Arousal/Alertness: Awake/alert Behavior During Therapy: WFL for tasks assessed/performed Overall Cognitive Status: Within Functional Limits for  tasks assessed Area of Impairment: Problem solving;Safety/judgement;Attention                    Current Attention Level: Selective     Safety/Judgement: Decreased awareness of safety   Problem Solving: Slow processing General Comments: WFL for purpose of session        Exercises     Shoulder Instructions       General Comments      Pertinent Vitals/ Pain       Pain Assessment: Faces Pain Score: 4  Faces Pain Scale: Hurts little more Pain Location: low back and R hip Pain Descriptors / Indicators: Grimacing;Aching Pain Intervention(s): Monitored during session  Home Living                                          Prior Functioning/Environment              Frequency  Min 5X/week        Progress Toward Goals  OT Goals(current goals can now be found in the care plan section)  Progress towards OT goals: Progressing toward goals  Acute Rehab OT Goals Patient Stated Goal: to reduce pain OT Goal Formulation: With patient Time For Goal Achievement: 12/05/20 Potential to Achieve Goals: Fair ADL Goals Pt Will Perform Grooming: with modified independence;standing Pt Will Perform Lower Body Bathing: with modified independence;sit to/from stand;sitting/lateral leans;with adaptive equipment Pt Will Perform Lower Body Dressing: with modified independence;with adaptive equipment;sitting/lateral leans;sit to/from stand Pt Will Transfer to Toilet: with modified independence;bedside commode;ambulating Pt Will Perform Toileting - Clothing Manipulation and hygiene: with modified independence;with adaptive equipment;sitting/lateral leans;sit to/from stand Pt Will Perform Tub/Shower Transfer: with modified independence;rolling walker  Plan Discharge plan needs to be updated;Frequency remains appropriate    Co-evaluation                 AM-PAC OT "6 Clicks" Daily Activity     Outcome Measure   Help from another person eating meals?: None Help from another person taking care of personal grooming?: A Little Help from another person toileting,  which includes using toliet, bedpan, or urinal?: A Little Help from another person bathing (including washing, rinsing, drying)?: A Little Help from another person to put on and taking off regular upper body clothing?: A Little Help from another person to put on and taking off regular lower body clothing?: A Lot 6 Click Score: 18    End of Session Equipment Utilized During Treatment: Rolling walker  OT Visit Diagnosis: Unsteadiness on feet (R26.81);Other abnormalities of gait and mobility (R26.89);Muscle weakness (generalized) (M62.81);Pain;Other (comment) Pain - Right/Left: Right Pain - part of body: Hip;Knee   Activity Tolerance Patient limited by fatigue;Patient limited by pain   Patient Left in chair;with call bell/phone within reach   Nurse Communication Mobility status        Time: [id number] OT Time Calculation (min): 49 min  Charges: OT General Charges $OT Visit: 1 Visit OT Treatments $Self Care/Home Management : 38-52 mins  Lodema Hong, Ali Chuk 12/13/2020, 9:03 AM

## 2020-12-13 NOTE — TOC Progression Note (Addendum)
Transition of Care [facility]) - Progression Note    Patient Details  Name: Heather [name] MRN: [id number] Date of Birth: 1976/02/22  Transition of Care [facility]) CM/SW [place], [place] Phone Number: 12/13/2020, 9:09 AM  Clinical Narrative:     CSW spoke with Lessie Dings with [place] program. CSW provided clinical and social information regarding pt to see if she would be appropriate. At this time pt would not be appropriate. Debbie explained that if TOC was unable to find shelter placement she may be able to review pt again to assist with placement. Her contact # is [phone]  CSW called and left message with [place] [phone]  requesting return call.   Pt may be appropriate for ALF or Family Care home. CSW contacted [employer]; awaiting response  CSW attempted to call [facility] but phone number not working; CSW submitted online contact request, awaiting response.   CSW called Damien Fusi with [facility] and other family care homes. She requests CSW fax clinicals to [phone]  CSW called Anitha with [facility]. They cannot accept bariatric pts.   Expected Discharge Plan: Skilled Nursing Facility Barriers to Discharge: [place] ([place]), Homeless with medical needs (Homeless)  Expected Discharge Plan and Services Expected Discharge Plan: [place] In-house Referral: Clinical Social Work   Post Acute Care Choice: [place] (Needs LTC bed) Living arrangements for the past 2 months: Homeless Expected Discharge Date: 11/02/20                                     Social Determinants of Health (SDOH) Interventions    Readmission Risk Interventions Readmission Risk Prevention Plan 12/08/2020  Transportation Screening Complete  Medication Review Press photographer) Complete  PCP or Specialist appointment within 3-5 days of discharge Complete  HRI or [place]  Complete  SW Recovery Care/Counseling Consult Complete  [place] Not Applicable  Some recent data might be hidden

## 2020-12-13 NOTE — Progress Notes (Signed)
Patient seen and examined this morning personally chart reviewed. Refer to NP Allison's note for more detail.  Briefly, 45 year old female with multiple comorbidities including morbid obesity , T2DM, OSA/COPD, MDD, GAD, insomnia, RLS, renal mass, numerous small wounds under her pannus upper thighs and between her legs presented to the ED 7/25 with right flank and suprapubic pain, nausea vomiting and was admitted for complicated UTI. Treated with antibiotics seen by PT OT has been stable for discharge awaiting on placement to skilled nursing facility. But due to patient's homelessness status  insurance isseus, disposition has been difficulty.  On exam is alert awake, pleasant.  Not in distress.  On room air. Lungs bilaterally clear, nonfocal. Mood pleasant.   Issues:  Complicated UTI Enterobacter clonization Nonobstructing nephrolithiasis-started on calculus on the left Renal cyst 8.3 cm right upper pole At this time asymptomatic.  Need urology follow-up.  Suicidal ideation Generalized anxiety/depression Insomnia Seen by psychiatry cleared for discharge on multiple regimen with Wellbutrin, trazodone, Topamax, Neurontin, clonazepam as needed  Suspected OSA/OHS-monitor nocturnal oximetry  Right hip/back/leg pain Known disc herniation on imaging Continue pain control Lyrica, meloxicam antispasm meds and will benefit with outpatient follow-up  R LS continue Requip  CKD stage IIIb-off Lasix monitor closely on NSAID  Morbid obesity with BMI  76: Will benefit with outpatient follow-up weight loss lifestyle  Yeast panniculitis-improved.  Continue nystatin  Generalized deconditioning/weakness/homelessness/difficulty with disposition: Pending SNF

## 2020-12-13 NOTE — TOC Progression Note (Addendum)
Transition of Care [facility]) - Progression Note    Patient Details  Name: Heather [name] MRN: [id number] Date of Birth: 1975/08/21  Transition of Care [facility]) CM/SW [place], RN Phone Number: 12/13/2020, 9:58 AM  Clinical Narrative:    Case management spoke with Adolm Joseph, MSW supervisor with Vulnerable Homeless population in the community and she confirmed that the patient might need ALF or Group home placement for her personal care needs and that a community shelter might decline her for admission due to her bariatric needs and mobility at this time.  I spoke with Macon Large, MSW and he is going to explore ALF options for the patient at this time.  CM called and left a message with Earnest Bailey, DSS MSW and gave her an update and patient's need for disability and probably ALF placement for personal care needs - including mobilization, bathing, dressing and medication management.  CM and MSW with DTP Team will continue to follow the patient for ALF placement.  12/13/2020 1402 - CM called and left a message with Monia Pouch, CM at [facility] to check if the facility would have bed availability at the facility.   Expected Discharge Plan: Skilled Nursing Facility Barriers to Discharge: [place] ([place]), Homeless with medical needs (Homeless)  Expected Discharge Plan and Services Expected Discharge Plan: [place] In-house Referral: Clinical Social Work   Post Acute Care Choice: [place] (Needs LTC bed) Living arrangements for the past 2 months: Homeless Expected Discharge Date: 11/02/20                                     Social Determinants of Health (SDOH) Interventions    Readmission Risk Interventions Readmission Risk Prevention Plan 12/08/2020  Transportation Screening Complete  Medication Review Press photographer) Complete  PCP or Specialist appointment within 3-5 days of  discharge Complete  HRI or [place] Complete  SW Recovery Care/Counseling Consult Complete  [place] Not Applicable  Some recent data might be hidden

## 2020-12-13 NOTE — Plan of Care (Signed)
  Problem: Activity: Goal: Risk for activity intolerance will decrease Outcome: Progressing   

## 2020-12-13 NOTE — Progress Notes (Signed)
Physical Therapy Treatment Patient Details Name: Heather [name] MRN: [id number] DOB: 10/24/1975 Today's Date: 12/13/2020    History of Present Illness 45 y.o. female presented to ED for chest pain and palpitations and feeling like her throat was closing while at her behavioral health facility for SI. Admitted 10/31/20 for treatment of complicated UTI PMH: anxiety and depression (hx of bipolar and schizoaffective disorder in chart in past), ACD, panniculitis, debility,GERD,  OSA not on cpap, chronic back pain.    PT Comments    Pt on phone having IEP meeting with her children's [employer]. PT returned after meeting, pt tearful about situation with children custody. PT encouraged working on ambulation to improve independence so that she will have better chance of regaining custody. Pt agreeable and was able to progress her ambulation to 280 feet with seated rest breaks. Also discussed back and R Leg pain in context of MRI results and decreased ability for medical management. Encouraged increased muscle tone of core muscles to decrease load on back and started exercises to improve core strength. Educated pt that this will not be a quick solution but given lack of options worth trying. Plan for discharge in flux but will likely need to be an ALF given inability of pt to be independent during daytime hours when shelter is closed.     Follow Up Recommendations  Outpatient PT (pt too high level for SNF, case management team searching for homeless shelter for patient)     Equipment Recommendations  None recommended by PT (owns bariatric RW)       Precautions / Restrictions Precautions Precautions: Fall Precaution Comments: fell prior to hosp    Mobility  Bed Mobility Overal bed mobility: Needs Assistance Bed Mobility: Supine to Sit;Sit to Supine     Supine to sit: Supervision     General bed mobility comments: increased time, use of rails    Transfers Overall transfer level: Needs  assistance Equipment used: None Transfers: Sit to/from Stand Sit to Stand: Supervision         General transfer comment: used RW and bed rail with increased time and effort  Ambulation/Gait Ambulation/Gait assistance: Min guard Gait Distance (Feet): 50 Feet (+50, +80, +100) Assistive device: Rolling walker (2 wheeled) (bariatric RW) Gait Pattern/deviations: Step-through pattern;Trunk flexed Gait velocity: pt with slowed step-through gait, widened BOS, increased trunk flexion over RW, Gait velocity interpretation: <1.8 ft/sec, indicate of risk for recurrent falls General Gait Details: pt with step-through gait,         Balance Overall balance assessment: Needs assistance Sitting-balance support: No upper extremity supported;Feet supported Sitting balance-Leahy Scale: Fair     Standing balance support: No upper extremity supported Standing balance-Leahy Scale: Fair Standing balance comment: Reliant on BUE on RW with functional mobility                            Cognition Arousal/Alertness: Awake/alert Behavior During Therapy: WFL for tasks assessed/performed Overall Cognitive Status: Within Functional Limits for tasks assessed Area of Impairment: Awareness                               General Comments: WFL for mobility however does not easily make the connection between needing to be independent in order to have any chance of getting her kids back.      Exercises Other Exercises Other Exercises: bridging x10 Other Exercises: TA and glute activation  x 5    General Comments General comments (skin integrity, edema, etc.): Discussed getting a regular hospital bed as it will be easier to get in and out of, pt is at decreased risk of pressure injury due to her increased mobility, and that she will not have an air bed whereever she discharges to. Pt and DTP NP in agreement. RN to procure.      Pertinent Vitals/Pain Pain Assessment: Faces Faces  Pain Scale: Hurts little more Pain Location: low back and R hip Pain Descriptors / Indicators: Grimacing;Aching Pain Intervention(s): Limited activity within patient's tolerance;Monitored during session     PT Goals (current goals can now be found in the care plan section) Acute Rehab PT Goals Patient Stated Goal: to reduce pain PT Goal Formulation: With patient Time For Goal Achievement: 12/12/20 Potential to Achieve Goals: Fair Progress towards PT goals: Progressing toward goals    Frequency    Min 3X/week (try to progress activity tolerance to D/C to homeless shelter)      PT Plan Current plan remains appropriate       AM-PAC PT "6 Clicks" Mobility   Outcome Measure  Help needed turning from your back to your side while in a flat bed without using bedrails?: A Little Help needed moving from lying on your back to sitting on the side of a flat bed without using bedrails?: A Little Help needed moving to and from a bed to a chair (including a wheelchair)?: A Little Help needed standing up from a chair using your arms (e.g., wheelchair or bedside chair)?: A Little Help needed to walk in hospital room?: A Little Help needed climbing 3-5 steps with a railing? : A Lot 6 Click Score: 17    End of Session   Activity Tolerance: Patient limited by pain Patient left: with call bell/phone within reach;in chair Nurse Communication: Mobility status PT Visit Diagnosis: Unsteadiness on feet (R26.81);Muscle weakness (generalized) (M62.81);History of falling (Z91.81) Pain - Right/Left: Right Pain - part of body: Hip (low back)     Time: [id number] PT Time Calculation (min) (ACUTE ONLY): 46 min  Charges:  $Gait Training: 23-37 mins $Therapeutic Exercise: 8-22 mins                     [name] B. Migdalia Dk PT, DPT Acute Rehabilitation Services Pager [phone] Office [phone]    Walker 12/13/2020, 4:49 PM

## 2020-12-14 ENCOUNTER — Ambulatory Visit (INDEPENDENT_AMBULATORY_CARE_PROVIDER_SITE_OTHER): Payer: Medicaid Other | Admitting: Licensed Clinical Social Worker

## 2020-12-14 DIAGNOSIS — F339 Major depressive disorder, recurrent, unspecified: Secondary | ICD-10-CM | POA: Diagnosis not present

## 2020-12-14 MED ORDER — OXYCODONE HCL 5 MG PO TABS
5.0000 mg | ORAL_TABLET | Freq: Four times a day (QID) | ORAL | Status: DC | PRN
  Administered 2020-12-14: 5 mg via ORAL
  Filled 2020-12-14: qty 1

## 2020-12-14 MED ORDER — PREGABALIN 100 MG PO CAPS
100.0000 mg | ORAL_CAPSULE | Freq: Three times a day (TID) | ORAL | Status: DC
  Administered 2020-12-14 – 2021-01-05 (×67): 100 mg via ORAL
  Filled 2020-12-14 (×67): qty 1

## 2020-12-14 NOTE — Progress Notes (Signed)
[facility] PROGRESS NOTE  Heather [name] Gail [id number] DOB: 20-Jul-1975 DOA: 10/30/2020 PCP: Lucianne Lei, MD  Status: Remains inpatient appropriate because:Unsafe d/c plan  Dispo:  Patient From:  Homeless-newly homeless since May 2022  Planned Disposition: ALF??  Medically stable for discharge: Yes  Barriers to discharge: Morbid obesity/disability pending    Level of care: Med-Surg  Code Status: Full Family Communication: Patient only DVT prophylaxis: Lovenox COVID vaccination status: Moderna x1 on 11/06/2020  and Pfizer x1 on 08/28/2020    HPI: 45 y.o. female with PMH significant for morbid obesity, DM2, obstructive sleep apnea, COPD, MDD, GAD, insomnia and RLS,renal mass,  and numerous small wounds under her pannus, upper thighs and between her legs.   Patient presented to the ED on 7/25 presented with right flank and suprapubic pain along with nausea and vomiting.   She was admitted and treated for complicated UTI with antibiotics and has completed treatment.  Currently medically stable for discharge to SNF.  Subjective: Reports significant pain with more consistent participation with therapies and is requesting medication adjustment.   Objective: Vitals:   12/13/20 2104 12/14/20 0504  BP: 123/68 105/61  Pulse: 79 71  Resp: 18 18  Temp: 98.2 F (36.8 C) (!) 97.4 F (36.3 C)  SpO2: 98% 100%    Intake/Output Summary (Last 24 hours) at 12/14/2020 0810 Last data filed at 12/13/2020 2104 Gross per 24 hour  Intake 600 ml  Output 0 ml  Net 600 ml   Filed Weights   11/26/20 0500 11/27/20 0546 12/03/20 0600  Weight: (!) 196 kg (!) 196 kg (!) 194 kg    Exam:  Constitutional: NAD, calm, reports significant back and upper extremity discomfort with more consistent participation with therapy Respiratory: RA, lungs remain clear without increased work of breathing at rest.  No peripheral edema. Cardiovascular: S1-S2, normotensive, no peripheral edema, appropriate  capillary refill Abdomen: Soft and nontender.  Obese with pannus.  Bowel sounds positive. LBM 9/7, eating well Neurologic: CN 2-12 grossly intact. Sensation intact, Strength 4/5 x all 4 extremities.  Psychiatric: Normal judgment and insight. Alert and oriented x 3.    Assessment/Plan: Acute problems: Complicated UTI presumed 2/2 Enterobacter colonization -Patient completed 3 days of cefepime, urine culture colonized,   Nonobstructing nephrolithiasis Renal cyst -CT abdomen and pelvis from 7/26 showed bilateral nonobstructing stones including a staghorn calculus on the left.  Can follow-up with urology as an outpatient. -CT scan abdomen from 7/19 also showed up 8.3 cm right upper pole renal cyst likely benign.   Suicidal ideation/anxiety and depression -Psychiatry has cleared the patient for discharge.   -Continue Wellbutrin, trazodone, and prn clonazepam -Outpatient follow-up with psychiatry  Suspected OSA/OHS -Monitor nocturnal oximetry to determine if patient is having desaturations while sleeping  Right hip/back/leg pain and patient with known disc herniation on imaging -Discussed with IR regarding possible image guided injection of SI joint: 2 radiologists reviewed this patient, one is IR and one is a musculoskeletal rad, they both said that because of this patient's habitus/lack of equipment here in the hospital we won't be able to do an SI injection. The IR MD's do these in CT however due to her habitus we wouldn't be able to fit her + the needle in the scanner and the [facility] MDs do these in fluoro with a c-arm but we only have the fixed tables here in the hospital. You can place an order for this to be attempted at our outpatient spine center ([address]) but it  would need to be reviewed by the radiologists there before it would be scheduled.  -Given size of leg and knee as well as current skin breakdown she is not a candidate for splint or other immobilizing device to right  knee. -Back pain (chronic).  Better after resumption of outpatient pain medications (Topamax, meloxicam and Robaxin).  -Lyrica initiated this admission-increase to 100 mg on 9/8 and add short-term Oxy IR every 6 hour prn to help aid in participation with therapy -Gel pad ordered for bariatric chair --Previous LS MR from July 2020 w/L2-3 disc herniation with associated stenosis left; she was also found to have T12-L1 central disc herniation with upward migration.  Repeat MRI this admission essentially unchanged.  CKD 3B -Used to be on indapamide in the past.   --Creatinine stable with GFR less than 60 and with resumption of meloxicam creatinine stable at 1.38 with previous reading 1.25   Generalized deconditioning Homelessness -PT/OT initially recommended SNF.  History recent therapy sessions demonstrate increased independence with mobility when utilizing rolling walker. -Primary barriers to ambulation are patient's motivation and ongoing back pain which is being treated as above as well as her known thoracic and lumbar disc herniation causing chronic pain which is compounded by her morbid obesity  Thoracic and lumbar spine disc herniation -Has difficulty with mobility and chronic pain secondary to disc herniation which is significantly compounded by patient's profound morbid obesity -Because of chronic pain from this condition patient has not been able to obtain appropriate employment as the jobs she is qualified for require her to walk, stand or lift which she is unable to do   Yeast panniculitis -Significantly improved. -Continue nystatin powder to all of intertriginous areas (TID) -Instructions given for staff to wash all intertriginous areas with water and pat dry before applying a light dusting of the nystatin powder   Asthma -Stable, continue current inhalers.   Morbid obesity  -Body mass index is 76.53 kg/m. Patient has been advised to make an attempt to improve diet and exercise  patterns to aid in weight loss.   Generalized anxiety disorder/restless legs -Continue Wellbutrin, BuSpar, Neurontin, Requip.      Data Reviewed: Basic Metabolic Panel: Recent Labs  Lab 12/09/20 0251 12/12/20 0152  NA 140  --   K 3.7  --   CL 105  --   CO2 26  --   GLUCOSE 88  --   BUN 21*  --   CREATININE 1.38* 1.33*  CALCIUM 9.3  --     MR LUMBAR SPINE WO CONTRAST  Result Date: 12/12/2020 CLINICAL DATA:  Low back pain, known prior T12 and lumbar disc herniation EXAM: MRI LUMBAR SPINE WITHOUT CONTRAST TECHNIQUE: Multiplanar, multisequence MR imaging of the lumbar spine was performed. No intravenous contrast was administered. COMPARISON:  Lumbar spine MRI 11/04/2018, CT lumbar spine 08/18/2020 FINDINGS: Segmentation:  Standard. Alignment: Grade 1 anterolisthesis of L5 on S1 is unchanged. Alignment is otherwise normal. Vertebrae: Vertebral body heights are preserved. There is no evidence of acute fracture. Marrow signal is diffusely hypointense on T1 and T2 images. There is mild degenerative endplate signal abnormality at L4-L5 and L5-S1. There is a suspected unilateral right L5-S1 pars defect as seen on prior CT. Conus medullaris and cauda equina: Conus extends to the mid L1 level. Conus and cauda equina appear normal. Paraspinal and other soft tissues: There is a 7.8 cm right renal cyst with complex internal septations. This is not significantly changed in size since 2019. The soft tissues are  otherwise unremarkable. Disc levels: There is multilevel facet arthropathy, most advanced at L5-S1 with prominent bilateral effusions, increased since 2020. There is multilevel disc desiccation and narrowing, most advanced at L4-L5. T12-L1: There is a right paracentral disc extrusion with superior migration without significant spinal canal or neural foraminal stenosis. The extrusion is decreased in size since 2020. L1-L2: There is a mild disc bulge and bilateral facet arthropathy without significant  spinal canal or neural foraminal stenosis. L2-L3: There is a diffuse disc bulge with a left subarticular zone inferiorly migrated extrusion and bilateral facet arthropathy resulting in moderate to severe spinal canal stenosis with probable impingement of the traversing left cauda equina nerve roots. These findings are not significantly changed since 2020. L3-L4: There is a shallow disc bulge and bilateral facet arthropathy without significant spinal canal or neural foraminal stenosis. L4-L5: There is a mild disc bulge, degenerative endplate change, and bilateral facet arthropathy resulting in mild left worse than right neural foraminal stenosis without significant spinal canal stenosis. L5-S1: There is degenerative endplate change and bilateral facet arthropathy superimposed on grade 1 anterolisthesis resulting in severe bilateral neural foraminal stenosis without significant spinal canal stenosis, unchanged. IMPRESSION: 1. Left subarticular zone disc extrusion at L2-L3 resulting in moderate to severe spinal canal stenosis with impingement of the traversing left cauda equina nerve roots, unchanged since 2020. 2. Decreased size of the superiorly migrated disc extrusion at T12-L1. No significant spinal canal or neural foraminal stenosis at this level. 3. Severe bilateral neural foraminal stenosis at L5-S1, unchanged. 4. Advanced bilateral facet arthropathy at L5-S1 with prominent effusions, increased since 2020. 5. Diffusely abnormal T1 hypointensity throughout the bone marrow is unchanged going back to 2019. This is nonspecific and can be seen in the setting of chronic hypoxia (including smoking), anemia, or less likely lymphoproliferative disorder given chronicity. 6. No significant interval change in size of the complex right renal mass dating back to 2019. Slow-growing neoplasm is still not excluded. Electronically Signed   By: Valetta Mole M.D.   On: 12/12/2020 15:01    Scheduled Meds:  ascorbic acid  500 mg  Oral Daily   buPROPion ER  200 mg Oral BID   clotrimazole   Topical BID   COVID-19 mRNA Vac-TriS (Pfizer)  0.3 mL Intramuscular Once   diclofenac Sodium  4 g Topical QID   enoxaparin (LOVENOX) injection  100 mg Subcutaneous Q24H   fluticasone  2 spray Each Nare BID   gabapentin  200 mg Oral TID   methocarbamol  500 mg Oral QID   montelukast  10 mg Oral QHS   nystatin   Topical TID   pantoprazole  40 mg Oral Daily   pregabalin  75 mg Oral TID   rOPINIRole  4 mg Oral QHS   senna-docusate  2 tablet Oral Daily   sodium chloride flush  3 mL Intravenous Q12H   topiramate  25 mg Oral BID   traZODone  50 mg Oral QHS   Continuous Infusions:  sodium chloride      Principal Problem:   Complicated UTI (urinary tract infection) Active Problems:   Asthma   PALPITATIONS, OCCASIONAL   Panniculitis   OSA (obstructive sleep apnea)   Major depressive disorder, recurrent episode, moderate (HCC)   Generalized anxiety disorder   Major depression, chronic   Thrombocytosis   Hypokalemia   CKD (chronic kidney disease), stage III ([place])   Consultants: Psychiatry  Procedures: None  Antibiotics: Fosfomycin x1 on 7/19, Macrodantin 7/21 and 7/22 Ertapenem x1 on  7/26 Cefepime 11/02/2027   Time spent: 25 minutes    Erin Hearing ANP  [facility] 7 am - 330 pm/M-F for direct patient care and secure chat Please refer to Amion for contact info 43  days

## 2020-12-14 NOTE — Progress Notes (Signed)
Occupational Therapy Treatment Patient Details Name: Heather [name] MRN: [id number] DOB: 1976/02/05 Today's Date: 12/14/2020    History of present illness 45 y.o. female presented to ED for chest pain and palpitations and feeling like her throat was closing while at her behavioral health facility for SI. Admitted 10/31/20 for treatment of complicated UTI PMH: anxiety and depression (hx of bipolar and schizoaffective disorder in chart in past), ACD, panniculitis, debility,GERD,  OSA not on cpap, chronic back pain.   OT comments  Patient a participant in [facility] program.  Patient was supervision for bed mobility and to ambulate to bathroom for toilet transfer.  Patient required assistance following BM for thoroughness.  Min guard for shower transfer and setup for bathing with patient using LH sponge.  Patient was setup for UB dressing and min assist for LB dressing with sock aide.  Patient making good progress with OT.  Continue with [facility] program.  Follow Up Recommendations       Equipment Recommendations  3 in 1 bedside commode;Tub/shower bench;Other (comment)    Recommendations for Other Services      Precautions / Restrictions Precautions Precautions: Fall Precaution Comments: fell prior to hosp Restrictions Weight Bearing Restrictions: No       Mobility Bed Mobility Overal bed mobility: Needs Assistance Bed Mobility: Supine to Sit     Supine to sit: Supervision     General bed mobility comments: increased time, use of rails    Transfers Overall transfer level: Needs assistance Equipment used: Rolling walker (2 wheeled) Transfers: Sit to/from Stand Sit to Stand: Supervision Stand pivot transfers: Min guard       General transfer comment: min guard with shower transfers    Balance Overall balance assessment: Needs assistance Sitting-balance support: No upper extremity supported;Feet supported Sitting balance-Leahy Scale: Fair Sitting balance - Comments: sits on  side of bed with mattress deflated and feet on floor   Standing balance support: No upper extremity supported;During functional activity Standing balance-Leahy Scale: Fair Standing balance comment: Reliant on BUE on RW with functional mobility                           ADL either performed or assessed with clinical judgement   ADL Overall ADL's : Needs assistance/impaired     Grooming: Brushing hair;Sitting;Set up Grooming Details (indicate cue type and reason): setup only Upper Body Bathing: Set up;Sitting Upper Body Bathing Details (indicate cue type and reason): used LH sponge Lower Body Bathing: Minimal assistance;Sitting/lateral leans Lower Body Bathing Details (indicate cue type and reason): Assistance to dry feet; used LH sponge Upper Body Dressing : Set up;Sitting Upper Body Dressing Details (indicate cue type and reason): Donned overhead gown/dress seated in chair Lower Body Dressing: Minimal assistance;Sitting/lateral leans Lower Body Dressing Details (indicate cue type and reason): used sock aide to donn footwear Toilet Transfer: Supervision/safety;RW Toilet Transfer Details (indicate cue type and reason): BSC over toilet in bathroom Toileting- Clothing Manipulation and Hygiene: Moderate assistance;Sit to/from stand Toileting - Clothing Manipulation Details (indicate cue type and reason): required assistance for thoroughness after BM Tub/ Shower Transfer: Walk-in shower;Min guard;Cueing for safety;3 in 1 Tub/Shower Transfer Details (indicate cue type and reason): min guard for walk in shower with small ledge to step over Functional mobility during ADLs: Min guard;Rolling walker General ADL Comments: Patient was min guard following Administrator, sports  Cognition Arousal/Alertness: Awake/alert Behavior During Therapy: WFL for tasks assessed/performed Overall Cognitive Status: Within Functional Limits for tasks assessed Area  of Impairment: Awareness                   Current Attention Level: Selective     Safety/Judgement: Decreased awareness of safety Awareness: Intellectual Problem Solving: Slow processing General Comments: Continues to require vcs for safety        Exercises     Shoulder Instructions       General Comments      Pertinent Vitals/ Pain       Pain Assessment: Faces Pain Score: 2  Faces Pain Scale: Hurts a little bit Pain Location: low back and R hip Pain Descriptors / Indicators: Grimacing;Aching Pain Intervention(s): Monitored during session  Home Living                                          Prior Functioning/Environment              Frequency  Min 5X/week        Progress Toward Goals  OT Goals(current goals can now be found in the care plan section)  Progress towards OT goals: Progressing toward goals  Acute Rehab OT Goals Patient Stated Goal: to reduce pain OT Goal Formulation: With patient Time For Goal Achievement: 12/05/20 Potential to Achieve Goals: Fair ADL Goals Pt Will Perform Grooming: with modified independence;standing Pt Will Perform Lower Body Bathing: with modified independence;sit to/from stand;sitting/lateral leans;with adaptive equipment Pt Will Perform Lower Body Dressing: with modified independence;with adaptive equipment;sitting/lateral leans;sit to/from stand Pt Will Transfer to Toilet: with modified independence;bedside commode;ambulating Pt Will Perform Toileting - Clothing Manipulation and hygiene: with modified independence;with adaptive equipment;sitting/lateral leans;sit to/from stand Pt Will Perform Tub/Shower Transfer: with modified independence;rolling walker  Plan Discharge plan needs to be updated;Frequency remains appropriate    Co-evaluation                 AM-PAC OT "6 Clicks" Daily Activity     Outcome Measure   Help from another person eating meals?: None Help from another  person taking care of personal grooming?: A Little Help from another person toileting, which includes using toliet, bedpan, or urinal?: A Little Help from another person bathing (including washing, rinsing, drying)?: A Little Help from another person to put on and taking off regular upper body clothing?: A Little Help from another person to put on and taking off regular lower body clothing?: A Little 6 Click Score: 19    End of Session Equipment Utilized During Treatment: Rolling walker  OT Visit Diagnosis: Unsteadiness on feet (R26.81);Other abnormalities of gait and mobility (R26.89);Muscle weakness (generalized) (M62.81);Pain;Other (comment) Pain - Right/Left: Right Pain - part of body: Hip;Knee   Activity Tolerance Patient limited by fatigue;Patient limited by pain   Patient Left in chair;with call bell/phone within reach   Nurse Communication Mobility status        Time: [id number] OT Time Calculation (min): 52 min  Charges: OT General Charges $OT Visit: 1 Visit OT Treatments $Self Care/Home Management : 38-52 mins  Lodema Hong, OTA    Leita Lindbloom Alexis Goodell 12/14/2020, 8:58 AM

## 2020-12-14 NOTE — Progress Notes (Signed)
Physical Therapy Treatment Patient Details Name: Heather [name] MRN: [id number] DOB: April 17, 1975 Today's Date: 12/14/2020    History of Present Illness 45 y.o. female presented to ED for chest pain and palpitations and feeling like her throat was closing while at her behavioral health facility for SI. Admitted 10/31/20 for treatment of complicated UTI PMH: anxiety and depression (hx of bipolar and schizoaffective disorder in chart in past), ACD, panniculitis, debility,GERD,  OSA not on cpap, chronic back pain.    PT Comments    STAR PT session: Pt in regular hospital bed on entry, and able to move more easily. Focus of session working on ambulation and standing endurance. Pt perform 4 bouts of ambulation which included 5x sit>stand. Continue to educate pt that she needs to be out of the bed most of the day because she can not expect to lay in bed all day when she discharges. Pt reluctantly agrees but returns to supine at end of session, secondary to back pain. CM/LSW continue to look for appropriate discharge location.      Follow Up Recommendations  Outpatient PT (pt too high level for SNF, case management team searching for homeless shelter for patient)     Equipment Recommendations  None recommended by PT (owns bariatric RW)       Precautions / Restrictions Precautions Precautions: Fall Precaution Comments: fell prior to hosp Restrictions Weight Bearing Restrictions: No    Mobility  Bed Mobility Overal bed mobility: Needs Assistance Bed Mobility: Supine to Sit;Sit to Supine     Supine to sit: Supervision Sit to supine: Supervision   General bed mobility comments: increased time, use of rails    Transfers Overall transfer level: Needs assistance Equipment used: None Transfers: Sit to/from Stand Sit to Stand: Supervision         General transfer comment: used RW and bed or armrest to come to standing, x5 with ambulation rest breaks to lower  recliner.  Ambulation/Gait Ambulation/Gait assistance: Min guard Gait Distance (Feet): 50 Feet (+50,+80, +50, +80) Assistive device: Rolling walker (2 wheeled) (bariatric RW) Gait Pattern/deviations: Step-through pattern;Trunk flexed Gait velocity: pt with slowed step-through gait, widened BOS, increased trunk flexion over RW, Gait velocity interpretation: <1.8 ft/sec, indicate of risk for recurrent falls General Gait Details: continues to have slowed, waddling gait, with increasing stability and velocity.         Balance Overall balance assessment: Needs assistance Sitting-balance support: No upper extremity supported;Feet supported Sitting balance-Leahy Scale: Fair     Standing balance support: No upper extremity supported Standing balance-Leahy Scale: Fair Standing balance comment: Reliant on BUE on RW with functional mobility                            Cognition Arousal/Alertness: Awake/alert Behavior During Therapy: WFL for tasks assessed/performed Overall Cognitive Status: Within Functional Limits for tasks assessed Area of Impairment: Awareness                   Current Attention Level: Selective     Safety/Judgement: Decreased awareness of safety     General Comments: WFL for mobility however does not easily make the connection between needing to be independent in order to have any chance of getting her kids back.      Exercises Other Exercises Other Exercises: bridging x10    General Comments General comments (skin integrity, edema, etc.): Has regular bed in room, and is able to perform bed mobility more easily,  pt also requested original bariatric recliner to sit up, however when requested to sit up after ambulation pt refuses and returns to supine siting back pain      Pertinent Vitals/Pain Pain Assessment: Faces Faces Pain Scale: Hurts a little bit Pain Location: low back and R hip Pain Descriptors / Indicators:  Grimacing;Aching Pain Intervention(s): Limited activity within patient's tolerance;Monitored during session;Repositioned     PT Goals (current goals can now be found in the care plan section) Acute Rehab PT Goals Patient Stated Goal: to reduce pain PT Goal Formulation: With patient Time For Goal Achievement: 12/12/20 Potential to Achieve Goals: Fair Progress towards PT goals: Progressing toward goals    Frequency    Min 3X/week (try to progress activity tolerance to D/C to homeless shelter)      PT Plan Current plan remains appropriate       AM-PAC PT "6 Clicks" Mobility   Outcome Measure  Help needed turning from your back to your side while in a flat bed without using bedrails?: A Little Help needed moving from lying on your back to sitting on the side of a flat bed without using bedrails?: A Little Help needed moving to and from a bed to a chair (including a wheelchair)?: A Little Help needed standing up from a chair using your arms (e.g., wheelchair or bedside chair)?: A Little Help needed to walk in hospital room?: A Little Help needed climbing 3-5 steps with a railing? : A Lot 6 Click Score: 17    End of Session   Activity Tolerance: Patient limited by pain Patient left: with call bell/phone within reach;in chair Nurse Communication: Mobility status PT Visit Diagnosis: Unsteadiness on feet (R26.81);Muscle weakness (generalized) (M62.81);History of falling (Z91.81) Pain - Right/Left: Right Pain - part of body: Hip (low back)     Time: [id number] PT Time Calculation (min) (ACUTE ONLY): 40 min  Charges:  $Therapeutic Exercise: 38-52 mins                     Ijeoma Loor B. Migdalia Dk PT, DPT Acute Rehabilitation Services Pager [phone] Office [phone]    [place] 12/14/2020, 3:13 PM

## 2020-12-14 NOTE — Progress Notes (Signed)
  Brief narrative 45 year old female with multiple comorbidities including morbid obesity , T2DM, OSA/COPD, MDD, GAD, insomnia, RLS, renal mass, numerous small wounds under her pannus upper thighs and between her legs presented to the ED 7/25 with right flank and suprapubic pain, nausea vomiting and was admitted for complicated UTI. Treated with antibiotics seen by PT OT has been stable for discharge awaiting on placement to skilled nursing facility. But due to patient's homelessness status  insurance isseus, disposition has been difficulty.  Seen and examined this morning.  Seen the bedside chair.  Alert awake oriented not in distress.  On exam is morbidly obese lady, pleasant, Nonfocal on exam Abdomen soft distended with obesity nontender  Issues:  Complicated UTI Enterobacter clonization Nonobstructing nephrolithiasis-started on calculus on the left Renal cyst 8.3 cm right upper pole At this time asymptomatic.  Need urology follow-up.  Suicidal ideation Generalized anxiety/depression Insomnia Seen by psychiatry cleared for discharge on multiple regimen with Wellbutrin, trazodone, Topamax, Neurontin, clonazepam as needed  Suspected OSA/OHS-monitor nocturnal oximetry  Right hip/back/leg pain Known disc herniation on imaging Continue pain control Lyrica, meloxicam antispasm meds and will benefit with outpatient follow-up  R LS continue Requip  CKD stage IIIb-off Lasix monitor closely on NSAID  Morbid obesity with BMI  76: Will benefit with outpatient follow-up weight loss lifestyle  Yeast panniculitis-improved.  Continue nystatin  Generalized deconditioning/weakness/homelessness/difficulty with disposition: Pending SNF placement.  Discussed with NP, ONO pending

## 2020-12-14 NOTE — Progress Notes (Signed)
   THERAPIST PROGRESS NOTE   Virtual Visit via Video Note  I connected with Heather [name] on 12/14/20 at  2:00 PM EDT by a video enabled telemedicine application and verified that I am speaking with the correct person using two identifiers.  Location: Patient: Hospital Provider: [facility]   I discussed the limitations of evaluation and management by telemedicine and the availability of in person appointments. The patient expressed understanding and agreed to proceed. I discussed the assessment and treatment plan with the patient. The patient was provided an opportunity to ask questions and all were answered. The patient agreed with the plan and demonstrated an understanding of the instructions.   I provided 20 minutes of non-face-to-face time during this encounter.  Participation Level: Minimal  Behavioral Response:  Hosptial bed/clothesAlertDepressed  Type of Therapy: Individual Therapy  Treatment Goals addressed: Communication: Briefly establish care.  Interventions: Supportive and Other: Briefly establish care.  Summary: Heather [name] is a 45 y.o. female who presents with hx of MDD. Pt had CCA done 7/22 and 7/25. Thorough chart review done prior to session. This date pt signs on for video session. Pt is inpatient in hosp d/t "complicated UTI". LCSW introduced self and role. Advised of being able to meet briefly but to reschedule for a time pt is feeling better to complete initial session. Pt reports she will be going from hosp to SNF placement. She states this will likely be a permanent placement as she describes self as currently "homeless". Pt is distressed over her overall situation and particularly distressed she may not be able to get her children, twin girls, back out of [facility]. Validated pt's sense of loss and despair. Provided education on impacts of situational depression on hx of clinical dep. Pt verbalizes understanding. She states she does have a couple of good  friends who are supportive, one of which is a Marine scientist. Pt encouraged to take situation a day at a time. She has faith as one of her coping strategies which was also encouraged. Pt agrees with next avail appt slot.    Suicidal/Homicidal: Nowithout intent/plan  Therapist Response: Pt open to care.  Plan: Return again for next avail appt.  Diagnosis: Axis I:  MDD     Hermine Messick, LCSW 12/14/2020

## 2020-12-14 NOTE — TOC Progression Note (Addendum)
Transition of Care [facility]) - Progression Note    Patient Details  Name: Heather [name] MRN: [id number] Date of Birth: 1975-11-07  Transition of Care [facility]) CM/SW [employer], [place] Phone Number: 12/14/2020, 12:38 PM  Clinical Narrative:     CSW called Damien Fusi who manages multiple ALF/family care homes; no answer and no voicemailbox available.   CSW called Hydia with [facility] and left message requesting return call.   1406: Spoke with Suanne Marker with [facility] facilities; they cannot accept pt over 400lb and only do so in case by case basis.   CSW spoke with Raquel Sarna at [facility]; their cutoff weight is 350-370lb. Raquel Sarna states CSW can try sister facilities: [facility], [place], and [place]   CSW called [facility] in [place]; no answer, left voicemail requesting return call  CSW called [facility]; they do not currently have bariatric beds.   CSW called [facility] in [place];no answer, left voicemail with Ginger Wynetta Emery requesting return call  CSW called admissions at [facility] to see if they would consider LOG until pt's facility medicaid was active. Admissions liaison said she would discuss with leadership and call CSW back.   Expected Discharge Plan: Skilled Nursing Facility Barriers to Discharge: [place] ([place]), Homeless with medical needs (Homeless)  Expected Discharge Plan and Services Expected Discharge Plan: [place] In-house Referral: Clinical Social Work   Post Acute Care Choice: [place] (Needs LTC bed) Living arrangements for the past 2 months: Homeless Expected Discharge Date: 11/02/20                                     Social Determinants of Health (SDOH) Interventions    Readmission Risk Interventions Readmission Risk Prevention Plan 12/08/2020  Transportation Screening Complete  Medication Review Press photographer)  Complete  PCP or Specialist appointment within 3-5 days of discharge Complete  HRI or [place] Complete  SW Recovery Care/Counseling Consult Complete  [place] Not Applicable  Some recent data might be hidden

## 2020-12-14 NOTE — TOC Progression Note (Addendum)
Transition of Care [facility]) - Progression Note    Patient Details  Name: Heather [name] MRN: [id number] Date of Birth: 02/19/1976  Transition of Care [facility]) CM/SW [place], RN Phone Number: 12/14/2020, 9:46 AM  Clinical Narrative:    CM spoke with Monia Pouch, CM at [facility] and the facility is unable to offer a LTC admission bed to the patient at this time due to staffing issues at the facility.  CM and MSW with DTP Team will continue to explore options for placement for the patient.  Patient is unable to discharge safely to homeless shelter at this time due to the patient's mobility needs and bariatric weight.  12/14/2020 1020 - CM left messages with admissions with various SNF facilities including:  [place], [place] [facility] facilities - [place] [facility] - [place] facilities - [place] - [id number] [place] - [place] - [id number] [place] [phone]  CM and MSW with DTP Team will continue to follow the patient for transitions of care needs and probable placement.  12/14/2020 1301 - Spoke with Erin Hearing, NP and disability qualifying letter was emailed to financial counseling at [email or web address].  12/14/2020 1437 - Anselm Pancoast, CM at [facility] is reviewing the patient at this time.  Expected Discharge Plan: Skilled Nursing Facility Barriers to Discharge: [place] ([place]), Homeless with medical needs (Homeless)  Expected Discharge Plan and Services Expected Discharge Plan: [place] In-house Referral: Clinical Social Work   Post Acute Care Choice: [place] (Needs LTC bed) Living arrangements for the past 2 months: Homeless Expected Discharge Date: 11/02/20                                     Social Determinants of Health (SDOH) Interventions     Readmission Risk Interventions Readmission Risk Prevention Plan 12/08/2020  Transportation Screening Complete  Medication Review Press photographer) Complete  PCP or Specialist appointment within 3-5 days of discharge Complete  HRI or [place] Complete  SW Recovery Care/Counseling Consult Complete  [place] Not Applicable  Some recent data might be hidden

## 2020-12-15 MED ORDER — OXYCODONE HCL 5 MG PO TABS
5.0000 mg | ORAL_TABLET | ORAL | Status: DC | PRN
Start: 2020-12-15 — End: 2021-01-06
  Administered 2020-12-15 – 2021-01-04 (×23): 5 mg via ORAL
  Filled 2020-12-15 (×23): qty 1

## 2020-12-15 MED ORDER — UMECLIDINIUM BROMIDE 62.5 MCG/INH IN AEPB
1.0000 | INHALATION_SPRAY | Freq: Every day | RESPIRATORY_TRACT | Status: DC
  Administered 2020-12-15 – 2021-01-05 (×20): 1 via RESPIRATORY_TRACT
  Filled 2020-12-15 (×4): qty 7

## 2020-12-15 MED ORDER — IPRATROPIUM-ALBUTEROL 0.5-2.5 (3) MG/3ML IN SOLN: 3.0000 mL | RESPIRATORY_TRACT | Status: DC | PRN

## 2020-12-15 MED ORDER — MOMETASONE FURO-FORMOTEROL FUM 100-5 MCG/ACT IN AERO
2.0000 | INHALATION_SPRAY | Freq: Two times a day (BID) | RESPIRATORY_TRACT | Status: DC
  Administered 2020-12-15 – 2021-01-05 (×41): 2 via RESPIRATORY_TRACT
  Filled 2020-12-15 (×4): qty 8.8

## 2020-12-15 MED ORDER — BACITRACIN ZINC 500 UNIT/GM EX OINT
TOPICAL_OINTMENT | Freq: Two times a day (BID) | CUTANEOUS | Status: AC
  Administered 2020-12-21 – 2020-12-23 (×2): 2 via TOPICAL
  Filled 2020-12-15: qty 28.4

## 2020-12-15 NOTE — Progress Notes (Signed)
[facility] PROGRESS NOTE  Heather [name] [id number] DOB: Aug 10, 1975 DOA: 10/30/2020 PCP: Heather Lei, MD  Status: Remains inpatient appropriate because:Unsafe d/c plan  Dispo:  Patient From:  Homeless-newly homeless since May 2022  Planned Disposition: ALF??  Medically stable for discharge: Yes  Barriers to discharge: Morbid obesity/disability pending    Level of care: Med-Surg  Code Status: Full Family Communication: Patient only DVT prophylaxis: Lovenox COVID vaccination status: Moderna x1 on 11/06/2020  and Pfizer x1 on 08/28/2020    HPI: 45 y.o. female with PMH significant for morbid obesity, DM2, obstructive sleep apnea, COPD, MDD, GAD, insomnia and RLS,renal mass,  and numerous small wounds under her pannus, upper thighs and between her legs.   Patient presented to the ED on 7/25 presented with right flank and suprapubic pain along with nausea and vomiting.   She was admitted and treated for complicated UTI with antibiotics and has completed treatment.  Currently medically stable for discharge to SNF.  Subjective: Patient reports increased nocturnal wheezing consistent with known asthma.  Nursing staff also noted unusual lesions just below her neck, upper back that are irregular in appearance, raised but nonpruritic and nontender in these areas are blanchable.  Patient also reports inadequate pain control despite adjustments in medications yesterday.  She asked me if she can have IV pain medication and I said that that was not indicated.   Objective: Vitals:   12/14/20 2053 12/15/20 0608  BP: 125/70 118/65  Pulse: 86   Resp: 18 18  Temp: 98.6 F (37 C) 98.5 F (36.9 C)  SpO2: 94% 100%    Intake/Output Summary (Last 24 hours) at 12/15/2020 [id number] Last data filed at 12/15/2020 [id number] Gross per 24 hour  Intake 240 ml  Output 0 ml  Net 240 ml   Filed Weights   11/26/20 0500 11/27/20 0546 12/03/20 0600  Weight: (!) 196 kg (!) 196 kg (!) 194 kg     Exam:  Constitutional: NAD, calm, reports continued pain in back and Heather [name]'s especially while working with therapy. Respiratory: Posterior lung sounds are clear to auscultation but diminished throughout, stable on room air.  No increased work of breathing while sitting on side of the bed or with repositioning back into bed with assistance Cardiovascular: Heart sounds are normal, normotensive, pulses regular Abdomen: Soft and nontender.  Obese.  Bowel sounds positive. LBM 9/8, eating well Neurologic: CN 2-12 grossly intact. Sensation intact, Strength 4/5 x all 4 extremities.  Psychiatric: Normal judgment and insight. Alert and oriented x 3.    Assessment/Plan: Acute problems: Complicated UTI presumed 2/2 Enterobacter colonization -Patient completed 3 days of cefepime, urine culture colonized,   Nonobstructing nephrolithiasis Renal cyst -CT abdomen and pelvis from 7/26 showed bilateral nonobstructing stones including a staghorn calculus on the left.  Can follow-up with urology as an outpatient. -CT scan abdomen from 7/19 also showed up 8.3 cm right upper pole renal cyst likely benign.   Suicidal ideation/anxiety and depression -Psychiatry has cleared the patient for discharge.   -Continue Wellbutrin, trazodone, and prn clonazepam -Outpatient follow-up with psychiatry  Suspected OSA/OHS -Patient with O2 saturations between 90 and 94% while sleeping -Unable to follow if any apneic episodes therefore suspect once patient obtains disability and her Medicaid she will need to be referred for a polysomnogram/home sleep study  Right hip/back/leg pain and patient with known disc herniation on imaging -Discussed with IR regarding possible image guided injection of SI joint: 2 radiologists reviewed this patient, one is IR and one is  a musculoskeletal rad, they both said that because of this patient's habitus/lack of equipment here in the hospital we won't be able to do an SI injection. The  IR MD's do these in CT however due to her habitus we wouldn't be able to fit her + the needle in the scanner and the MSK MDs do these in [place]oro with a c-arm but we only have the fixed tables here in the hospital. You can place an order for this to be attempted at our outpatient spine center ([address]) but it would need to be reviewed by the radiologists there before it would be scheduled.  -Given size of leg and knee as well as current skin breakdown she is not a candidate for splint or other immobilizing device to right knee. -Back pain (chronic).  Better after resumption of outpatient pain medications (Topamax, meloxicam and Robaxin).  -Lyrica initiated this admission-increase to 100 mg on 9/8 and add short-term Oxy IR every 6 hour prn to help aid in participation with therapy -Gel pad ordered for bariatric chair --Previous LS MR from July 2020 w/L2-3 disc herniation with associated stenosis left; she was also found to have T12-L1 central disc herniation with upward migration.  Repeat MRI this admission essentially unchanged.  Asthma -Patient complaining of nocturnal wheezing and difficulty breathing which is not improved with use of albuterol rescue inhaler and is requesting nebulizer -Discontinue albuterol MDI in favor of DuoNebs as needed -We will start patient on long-acting agents: Dulera as well as Umeclidinium -Continue Singulair  CKD 3B -Used to be on indapamide in the past.   --Creatinine stable with GFR less than 60 and with resumption of meloxicam creatinine stable at 1.38 with previous reading 1.25   Generalized deconditioning Homelessness -PT/OT initially recommended SNF.  History recent therapy sessions demonstrate increased independence with mobility when utilizing rolling walker. -Primary barriers to ambulation are patient's motivation and ongoing back pain which is being treated as above as well as her known thoracic and lumbar disc herniation causing chronic pain which  is compounded by her morbid obesity  Thoracic and lumbar spine disc herniation -Has difficulty with mobility and chronic pain secondary to disc herniation which is significantly compounded by patient's profound morbid obesity -Because of chronic pain from this condition patient has not been able to obtain appropriate employment as the jobs she is qualified for require her to walk, stand or lift which she is unable to do   Yeast panniculitis -Significantly improved. -Continue nystatin powder to all of intertriginous areas (TID) -Instructions given for staff to wash all intertriginous areas with water and pat dry before applying a light dusting of the nystatin powder   Morbid obesity  -Body mass index is 76.53 kg/m. Patient has been advised to make an attempt to improve diet and exercise patterns to aid in weight loss.   Generalized anxiety disorder/restless legs -Continue Wellbutrin, BuSpar, Neurontin, Requip.      Data Reviewed: Basic Metabolic Panel: Recent Labs  Lab 12/09/20 0251 12/12/20 0152  NA 140  --   K 3.7  --   CL 105  --   CO2 26  --   GLUCOSE 88  --   BUN 21*  --   CREATININE 1.38* 1.33*  CALCIUM 9.3  --     No results found.  Scheduled Meds:  ascorbic acid  500 mg Oral Daily   buPROPion ER  200 mg Oral BID   clotrimazole   Topical BID   COVID-19 mRNA  Vac-TriS (Pfizer)  0.3 mL Intramuscular Once   diclofenac Sodium  4 g Topical QID   enoxaparin (LOVENOX) injection  100 mg Subcutaneous Q24H   fluticasone  2 spray Each Nare BID   methocarbamol  500 mg Oral QID   montelukast  10 mg Oral QHS   nystatin   Topical TID   pantoprazole  40 mg Oral Daily   pregabalin  100 mg Oral TID   rOPINIRole  4 mg Oral QHS   senna-docusate  2 tablet Oral Daily   sodium chloride flush  3 mL Intravenous Q12H   topiramate  25 mg Oral BID   traZODone  50 mg Oral QHS   Continuous Infusions:  sodium chloride      Principal Problem:   Complicated UTI (urinary tract  infection) Active Problems:   Asthma   PALPITATIONS, OCCASIONAL   Panniculitis   OSA (obstructive sleep apnea)   Major depressive disorder, recurrent episode, moderate (HCC)   Generalized anxiety disorder   Major depression, chronic   Thrombocytosis   Hypokalemia   CKD (chronic kidney disease), stage III ([place])   Consultants: Psychiatry  Procedures: None  Antibiotics: Fosfomycin x1 on 7/19, Macrodantin 7/21 and 7/22 Ertapenem x1 on 7/26 Cefepime 11/02/2027   Time spent: 25 minutes    Erin Hearing ANP  [facility] 7 am - 330 pm/M-F for direct patient care and secure chat Please refer to Amion for contact info 44  days

## 2020-12-15 NOTE — Progress Notes (Signed)
Physical Therapy Treatment Patient Details Name: Heather [name] MRN: [id number] DOB: [birth year] Today's Date: 12/15/2020    History of Present Illness 45 y.o. female presented to ED for chest pain and palpitations and feeling like her throat was closing while at her behavioral health facility for SI. Admitted 10/31/20 for treatment of complicated UTI PMH: anxiety and depression (hx of bipolar and schizoaffective disorder in chart in past), ACD, panniculitis, debility,GERD,  OSA not on cpap, chronic back pain.    PT Comments    Pt reports she was already up and walking with her boyfriend in hallway. Pt provided pt with praise. Pt reports increased pain all over but concedes it may be due to muscle soreness. Pt does report increased back pain and RN provided pain medication during session. Focus of session on dynamic balance and management of RW when it does not provide maximal support, like backing up and stepping sideways. Pt able to complete exercises with min guard assist. Pt reports she is glad to have a 2 day break from therapy.     Follow Up Recommendations  Outpatient PT (pt too high level for SNF, case management team searching for homeless shelter for patient)     Equipment Recommendations  None recommended by PT (owns bariatric RW)       Precautions / Restrictions Precautions Precautions: Fall Precaution Comments: fell prior to hosp Restrictions Weight Bearing Restrictions: No    Mobility  Bed Mobility Overal bed mobility: Needs Assistance Bed Mobility: Supine to Sit;Sit to Supine     Supine to sit: Supervision Sit to supine: Supervision   General bed mobility comments: increased time, use of rails    Transfers Overall transfer level: Needs assistance Equipment used: None Transfers: Sit to/from Stand Sit to Stand: Supervision         General transfer comment: used RW and bed or armrest to come to standing, x5 with ambulation rest breaks to lower  recliner.  Ambulation/Gait Ambulation/Gait assistance: Min guard Gait Distance (Feet): 50 Feet (+5,+5,+%5+15,+50) Assistive device: Rolling walker (2 wheeled) (bariatric RW) Gait Pattern/deviations: Step-through pattern;Trunk flexed Gait velocity: slowed Gait velocity interpretation: <1.8 ft/sec, indicate of risk for recurrent falls General Gait Details: worked on directional changes with the RW especially backing up and lateral stepping, educated on decreased support available from RW especially with backing up.          Balance Overall balance assessment: Needs assistance Sitting-balance support: No upper extremity supported;Feet supported Sitting balance-Leahy Scale: Fair     Standing balance support: No upper extremity supported Standing balance-Leahy Scale: Fair Standing balance comment: Reliant on BUE on RW with functional mobility                            Cognition Arousal/Alertness: Awake/alert Behavior During Therapy: WFL for tasks assessed/performed Overall Cognitive Status: Within Functional Limits for tasks assessed Area of Impairment: Awareness                   Current Attention Level: Selective     Safety/Judgement: Decreased awareness of safety     General Comments: continues to need increased education on muscle soreness and increasing endurance to be independent      Exercises Other Exercises Other Exercises: backward stepping Other Exercises: lateral stepping Other Exercises: tight circles with RW    General Comments General comments (skin integrity, edema, etc.): VSS on RA, mother in room and supportive of therapy  Pertinent Vitals/Pain Pain Assessment: Faces Faces Pain Scale: Hurts a little bit Pain Location: low back and R hip Pain Descriptors / Indicators: Grimacing;Aching Pain Intervention(s): Limited activity within patient's tolerance;Monitored during session;Repositioned     PT Goals (current goals can  now be found in the care plan section) Acute Rehab PT Goals Patient Stated Goal: to reduce pain PT Goal Formulation: With patient Time For Goal Achievement: 12/12/20 Potential to Achieve Goals: Fair Progress towards PT goals: Progressing toward goals    Frequency    Min 3X/week (try to progress activity tolerance to D/C to homeless shelter)      PT Plan Current plan remains appropriate       AM-PAC PT "6 Clicks" Mobility   Outcome Measure  Help needed turning from your back to your side while in a flat bed without using bedrails?: A Little Help needed moving from lying on your back to sitting on the side of a flat bed without using bedrails?: A Little Help needed moving to and from a bed to a chair (including a wheelchair)?: A Little Help needed standing up from a chair using your arms (e.g., wheelchair or bedside chair)?: A Little Help needed to walk in hospital room?: A Little Help needed climbing 3-5 steps with a railing? : A Lot 6 Click Score: 17    End of Session   Activity Tolerance: Patient limited by pain Patient left: with call bell/phone within reach;in chair Nurse Communication: Mobility status PT Visit Diagnosis: Unsteadiness on feet (R26.81);Muscle weakness (generalized) (M62.81);History of falling (Z91.81) Pain - Right/Left: Right Pain - part of body: Hip (low back)     Time: [id number] PT Time Calculation (min) (ACUTE ONLY): 42 min  Charges:  $Gait Training: 8-22 mins $Therapeutic Exercise: 23-37 mins                     Keianna Signer B. Migdalia Dk PT, DPT Acute Rehabilitation Services Pager [phone] Office [phone]    [place] 12/15/2020, 3:03 PM

## 2020-12-15 NOTE — Progress Notes (Signed)
Pt. O2 saturation thru the night is around 90-94% while sleeping on room air

## 2020-12-15 NOTE — Plan of Care (Signed)
  Problem: Health Behavior/Discharge Planning: Goal: Ability to manage health-related needs will improve Outcome: Progressing   Problem: Activity: Goal: Risk for activity intolerance will decrease Outcome: Progressing   Problem: Skin Integrity: Goal: Risk for impaired skin integrity will decrease Outcome: Progressing   

## 2020-12-15 NOTE — Progress Notes (Signed)
PROGRESS NOTE    Heather [name]  [id number] DOB: [birth year] DOA: 10/30/2020 PCP: Heather Lei, MD   Chief Complaint  Patient presents with   BH/CP  Brief Narrative: 45 year old female with multiple comorbidities including morbid obesity , T2DM, OSA/COPD, MDD, GAD, insomnia, RLS, renal mass, numerous small wounds under her pannus upper thighs and between her legs presented to the ED 7/25 with right flank and suprapubic pain, nausea vomiting and was admitted for complicated UTI. Treated with antibiotics seen by PT OT has been stable for discharge awaiting on placement to skilled nursing facility. But due to patient's homelessness status  insurance [employer]se[employer], disposition has been difficulty. Subjective: Seen this morning on the bedside chair.  Reports she had episodes of desaturation overnight. Reports mild WHEEZING  Assessment & Plan:  Complicated UTI Enterobacter clonization Nonobstructing nephrolithiasis-started on calculus on the left Renal cyst 8.3 cm right upper pole At this time asymptomatic.  Patient will need urology follow-up.  Afebrile.   Suicidal ideation-None currently Generalized anxiety/depression Insomnia Seen by psychiatry cleared for discharge .  Mental status stable continue multiple regimen with Wellbutrin, trazodone, Topamax, Neurontin, clonazepam as needed   Suspected OSA/OHS-monitor nocturnal oximetry- [place] bedtime prn   Right hip/back/leg pain Known disc herniation on imaging-thoracic and lumbar spine Continue pain control Lyrica, meloxicam antispasm meds cont PTOT.  Because of chronic pain from this condition patient has not been able to obtain appropriate employment.   RL continue Requip   CKD stage IIIb-off Lasix monitor closely on NSAID   Morbid obesity with BMI  76: Will benefit with outpatient follow-up weight loss lifestyle   Yeast panniculitis-improved.  Continue nystatin   Generalized deconditioning/weakness/homelessness/difficulty with  disposition: Pending SNF placement.  Status post COVID-vaccine Moderna x1 8/1 and Arenac x1 5/23  Difficult placement TOC following: Multiple places unable to offer bed due to her overweight  Diet Order             Diet regular Room service appropriate? Yes; Fluid consistency: Thin  Diet effective now           Diet general                  Patient's Body mass index is 75.76 kg/m. DVT prophylaxis:   Lovenox, encourage ambulation Code Status:   Code Status: Full Code  Family Communication: plan of care discussed with patient at bedside. Status is: Inpatient Remains inpatient appropriate because:Unsafe d/c plan Dispo:  Patient From: homeless   Planned Disposition: [place]  Medically stable for discharge:    [name] is not here today right Unresulted Labs (From admission, onward)     Start     Ordered   12/16/20 [id number]  Basic metabolic panel  Tomorrow morning,   R       Question:  Specimen collection method  Answer:  Lab=Lab collect   12/15/20 0956   11/07/20 0500  Creatinine, serum  (enoxaparin (LOVENOX)    CrCl >/= 30 ml/min)  Weekly,   R     Comments: while on enoxaparin therapy    10/31/20 1548           Medications reviewed:  Scheduled Meds:  ascorbic acid  500 mg Oral Daily   bacitracin   Topical BID   buPROPion ER  200 mg Oral BID   clotrimazole   Topical BID   COVID-19 mRNA Vac-TriS (Pfizer)  0.3 mL Intramuscular Once   diclofenac Sodium  4 g Topical QID   enoxaparin (LOVENOX) injection  100 mg Subcutaneous Q24H   fluticasone  2 spray Each Nare BID   methocarbamol  500 mg Oral QID   mometasone-formoterol  2 puff Inhalation BID   montelukast  10 mg Oral QHS   nystatin   Topical TID   pantoprazole  40 mg Oral Daily   pregabalin  100 mg Oral TID   rOPINIRole  4 mg Oral QHS   senna-docusate  2 tablet Oral Daily   sodium chloride flush  3 mL Intravenous Q12H   topiramate  25 mg Oral BID   traZODone  50 mg Oral QHS   umeclidinium  bromide  1 puff Inhalation Daily   Continuous Infusions:  sodium chloride     Consultants:see note  Procedures:see note Antimicrobials: Anti-infectives (From admission, onward)    Start     Dose/Rate Route Frequency Ordered Stop   11/01/20 1000  ceFEPIme (MAXIPIME) 2 g in sodium chloride 0.9 % 100 mL IVPB        2 g 200 mL/hr over 30 Minutes Intravenous Every 12 hours 10/31/20 1550 11/03/20 2309   11/01/20 0600  ceFEPIme (MAXIPIME) 2 g in sodium chloride 0.9 % 100 mL IVPB  Status:  Discontinued        2 g 200 mL/hr over 30 Minutes Intravenous Every 8 hours 10/31/20 1541 10/31/20 1550   10/31/20 1000  ertapenem (INVANZ) 1,000 mg in sodium chloride 0.9 % 100 mL IVPB  Status:  Discontinued        1 g 200 mL/hr over 30 Minutes Intravenous Every 24 hours 10/31/20 0948 10/31/20 1541      Culture/Microbiology    Component Value Date/Time   SDES  10/26/2020 1800    URINE, CLEAN CATCH Performed at [facility], [place] [address], [place], [place] [address]    SPECREQUEST  10/26/2020 1800    NONE Performed at [facility], [place] [address], [place], [place] [address]    CULT MULTIPLE SPECIES PRESENT, SUGGEST RECOLLECTION (A) 10/26/2020 1800   REPTSTATUS 10/28/2020 FINAL 10/26/2020 1800    Other culture-see note  Objective: Vitals: Today's Vitals   12/14/20 2238 12/15/20 0608 12/15/20 0930 12/15/20 1222  BP:  118/65 101/66   Pulse:   81 82  Resp:  18  18  Temp:  98.5 F (36.9 C) 97.9 F (36.6 C)   TempSrc:  Oral Oral   SpO2:  100% 100% 95%  Weight:      Height:      PainSc: Asleep       Intake/Output Summary (Last 24 hours) at 12/15/2020 1227 Last data filed at 12/15/2020 [id number] Gross per 24 hour  Intake 240 ml  Output 0 ml  Net 240 ml   Filed Weights   11/26/20 0500 11/27/20 0546 12/03/20 0600  Weight: (!) 196 kg (!) 196 kg (!) 194 kg   Weight change:   Intake/Output from previous day: 09/08 0701 - 09/09 0700 In: 240  [P.O.:240] Out: 0  Intake/Output this shift: No intake/output data recorded. Filed Weights   11/26/20 0500 11/27/20 0546 12/03/20 0600  Weight: (!) 196 kg (!) 196 kg (!) 194 kg   Examination: General exam: AAO 3, morbidly obese, pleasant, not in distress. HEENT:Oral mucosa moist, Ear/Nose WNL grossly,dentition normal. Respiratory system: bilaterally diminished no use of accessory muscle, non tender. Cardiovascular system: S1 & S2 + Gastrointestinal system: Abdomen soft, NT,ND, BS+. Nervous System:Alert, awake, moving extremities Extremities: no edema, distal peripheral pulses palpable.  Skin: No rashes,no icterus. MSK: Normal  muscle bulk,tone, power Data Reviewed: I have personally reviewed following labs and imaging studies CBC: No results for input(s): WBC, NEUTROABS, HGB, HCT, MCV, PLT in the last 168 hours. Basic Metabolic Panel: Recent Labs  Lab 12/09/20 0251 12/12/20 0152  NA 140  --   K 3.7  --   CL 105  --   CO2 26  --   GLUCOSE 88  --   BUN 21*  --   CREATININE 1.38* 1.33*  CALCIUM 9.3  --    GFR: Estimated Creatinine Clearance: 94.8 mL/min (A) (by C-G formula based on SCr of 1.33 mg/dL (H)). Liver Function Tests: No results for input(s): AST, ALT, ALKPHOS, BILITOT, PROT, ALBUMIN in the last 168 hours. No results for input(s): LIPASE, AMYLASE in the last 168 hours. No results for input(s): AMMONIA in the last 168 hours. Coagulation Profile: No results for input(s): INR, PROTIME in the last 168 hours. Cardiac Enzymes: No results for input(s): CKTOTAL, CKMB, CKMBINDEX, TROPONINI in the last 168 hours. BNP (last 3 results) No results for input(s): PROBNP in the last 8760 hours. HbA1C: No results for input(s): HGBA1C in the last 72 hours. CBG: No results for input(s): GLUCAP in the last 168 hours. Lipid Profile: No results for input(s): CHOL, HDL, LDLCALC, TRIG, CHOLHDL, LDLDIRECT in the last 72 hours. Thyroid Function Tests: No results for input(s): TSH,  T4TOTAL, FREET4, T3FREE, THYROIDAB in the last 72 hours. Anemia Panel: No results for input(s): VITAMINB12, FOLATE, FERRITIN, TIBC, IRON, RETICCTPCT in the last 72 hours. Sepsis Labs: No results for input(s): PROCALCITON, LATICACIDVEN in the last 168 hours.  No results found for this or any previous visit (from the past 240 hour(s)).   Radiology Studies: No results found.   LOS: 37 days   Antonieta Pert, MD [facility]  12/15/2020, 12:27 PM

## 2020-12-15 NOTE — Progress Notes (Signed)
Occupational Therapy Treatment Patient Details Name: Shakyla Gemmell Selden MRN: [id number] DOB: Aug 09, 1975 Today's Date: 12/15/2020    History of present illness 45 y.o. female presented to ED for chest pain and palpitations and feeling like her throat was closing while at her behavioral health facility for SI. Admitted 10/31/20 for treatment of complicated UTI PMH: anxiety and depression (hx of bipolar and schizoaffective disorder in chart in past), ACD, panniculitis, debility,GERD,  OSA not on cpap, chronic back pain.   OT comments  STAR patient report:  Patient seen by skilled OT to address self care and HEP. Patient stated she was fatigue from therapy and lack of sleep and asked not to perform shower.  Patient performed toilet transfer with supervision but required assistance to complete toilet hygiene. Bathing and dressing performed in recliner with patient donning hospital gown due to no clean personal clothing. Patient requires assistance with LB self care.  HEP and theraband provided with instructions to perform over weekend.  Patient continue to progress with OT and will continue with STAR program.   Follow Up Recommendations  Other (comment)    Equipment Recommendations  3 in 1 bedside commode;Tub/shower bench;Other (comment)    Recommendations for Other Services Other (comment)    Precautions / Restrictions Precautions Precautions: Fall Precaution Comments: fell prior to hosp       Mobility Bed Mobility Overal bed mobility: Needs Assistance Bed Mobility: Supine to Sit     Supine to sit: Supervision     General bed mobility comments: increased time, use of rails    Transfers Overall transfer level: Needs assistance Equipment used: Rolling walker (2 wheeled) Transfers: Sit to/from Stand Sit to Stand: Supervision         General transfer comment: Used RW for all transfers    Balance Overall balance assessment: Needs assistance Sitting-balance support: No upper  extremity supported;Feet supported Sitting balance-Leahy Scale: Fair Sitting balance - Comments: Sitting on EOB without UE support   Standing balance support: Bilateral upper extremity supported Standing balance-Leahy Scale: Fair Standing balance comment: Reliant on BUE on RW with functional mobility                           ADL either performed or assessed with clinical judgement   ADL Overall ADL's : Needs assistance/impaired     Grooming: Oral care;Brushing hair Grooming Details (indicate cue type and reason): setup only Upper Body Bathing: Set up;Sitting Upper Body Bathing Details (indicate cue type and reason): performed seatd in recliner Lower Body Bathing: Minimal assistance;Sitting/lateral leans Lower Body Bathing Details (indicate cue type and reason): Assistance with lower legs Upper Body Dressing : Minimal assistance;Sitting Upper Body Dressing Details (indicate cue type and reason): Assistance needed with hospital gown Lower Body Dressing: Minimal assistance;Sitting/lateral leans Lower Body Dressing Details (indicate cue type and reason): used sock aide to donn footwear Toilet Transfer: Supervision/safety;RW Toilet Transfer Details (indicate cue type and reason): BSC over toilet in bathroom Toileting- Clothing Manipulation and Hygiene: Moderate assistance;Sit to/from stand Toileting - Clothing Manipulation Details (indicate cue type and reason): required assistance for thoroughness after BM     Functional mobility during ADLs: Rolling walker;Supervision/safety General ADL Comments: Supervision from bed to bathroom and bathroom to recliner     Vision       Perception     Praxis      Cognition Arousal/Alertness: Awake/alert Behavior During Therapy: WFL for tasks assessed/performed Overall Cognitive Status: Within Functional Limits for tasks assessed  Area of Impairment: Awareness                   Current Attention Level: Selective      Safety/Judgement: Decreased awareness of safety Awareness: Intellectual Problem Solving: Slow processing General Comments: Complained of fatigue and lack of sleep        Exercises Exercises: General Upper Extremity General Exercises - Upper Extremity Shoulder Flexion: Strengthening;Both;10 reps;Seated;Theraband Theraband Level (Shoulder Flexion): Level 3 (Green) Shoulder ABduction: Strengthening;Both;10 reps;Seated;Theraband Theraband Level (Shoulder Abduction): Level 3 (Green) Elbow Flexion: Strengthening;Both;10 reps;Seated;Theraband Theraband Level (Elbow Flexion): Level 3 (Green) Elbow Extension: Strengthening;Both;10 reps;Seated;Theraband Theraband Level (Elbow Extension): Level 3 (Green)   Shoulder Instructions       General Comments      Pertinent Vitals/ Pain       Pain Assessment: 0-10 Pain Score: 3  Faces Pain Scale: Hurts a little bit Pain Location: low back and R hip Pain Descriptors / Indicators: Grimacing;Aching Pain Intervention(s): Monitored during session  Home Living                                          Prior Functioning/Environment              Frequency  Min 5X/week        Progress Toward Goals  OT Goals(current goals can now be found in the care plan section)  Progress towards OT goals: Progressing toward goals  Acute Rehab OT Goals Patient Stated Goal: to reduce pain OT Goal Formulation: With patient Time For Goal Achievement: 12/05/20 Potential to Achieve Goals: Fair ADL Goals Pt Will Perform Grooming: with modified independence;standing Pt Will Perform Lower Body Bathing: with modified independence;sit to/from stand;sitting/lateral leans;with adaptive equipment Pt Will Perform Lower Body Dressing: with modified independence;with adaptive equipment;sitting/lateral leans;sit to/from stand Pt Will Transfer to Toilet: with modified independence;bedside commode;ambulating Pt Will Perform Toileting - Clothing  Manipulation and hygiene: with modified independence;with adaptive equipment;sitting/lateral leans;sit to/from stand Pt Will Perform Tub/Shower Transfer: with modified independence;rolling walker  Plan Discharge plan needs to be updated;Frequency remains appropriate    Co-evaluation                 AM-PAC OT "6 Clicks" Daily Activity     Outcome Measure   Help from another person eating meals?: None Help from another person taking care of personal grooming?: A Little Help from another person toileting, which includes using toliet, bedpan, or urinal?: A Little Help from another person bathing (including washing, rinsing, drying)?: A Little Help from another person to put on and taking off regular upper body clothing?: A Little Help from another person to put on and taking off regular lower body clothing?: A Little 6 Click Score: 19    End of Session Equipment Utilized During Treatment: Rolling walker  OT Visit Diagnosis: Unsteadiness on feet (R26.81);Other abnormalities of gait and mobility (R26.89);Muscle weakness (generalized) (M62.81);Pain;Other (comment) Pain - Right/Left: Right Pain - part of body: Hip;Knee   Activity Tolerance Patient limited by fatigue;Patient limited by pain   Patient Left in chair;with call bell/phone within reach   Nurse Communication Mobility status;Other (comment) (nursing notified of two red spots on upper back near neck)        Time: [id number] OT Time Calculation (min): 45 min  Charges: OT General Charges $OT Visit: 1 Visit OT Treatments $Self Care/Home Management : 23-37 mins $Therapeutic Exercise: 8-22 mins  Lodema Hong, [place] 12/15/2020, 8:58 AM

## 2020-12-15 NOTE — TOC Progression Note (Addendum)
Transition of Care [facility]) - Progression Note    Patient Details  Name: Heather [name] MRN: [id number] Date of Birth: 1975-08-11  Transition of Care [facility]) CM/SW [place], [place] Phone Number: 12/15/2020, 9:27 AM  Clinical Narrative:     CSW spoke with Damien Fusi who manages various ALF/Family care homes. She stated she has received referral but has been unable to review. She will reach back out once reviewed. She explains that they do take bariatric pts who are ambulatory but would need to review pt to see if she is appropriate first.   CSW spoke with [facility] and Rehabilitation admissions dept. CSW is now informed that pt is over their weight limit of 380lb.   CSW called Galen Manila [phone]) who manages Family Care homes in [place]; she would be unable to take bariatric pt at this time.   CSW called De Blanch who manages [facility] and [facility]. CSW provides clinical details of pt. She does not automatically say no after hearing clinical details but when discussing insurance and financials, pt would need disability approved  before they could accept. They would not take an LOG   CSW called [facility] in [place]; spoke with Louis in admission. She requests CSW email referral to [email or web address] regional supervisor. CSW emailed referral.    Expected Discharge Plan: Skilled Nursing Facility Barriers to Discharge: [place] ([place]), Homeless with medical needs (Homeless)  Expected Discharge Plan and Services Expected Discharge Plan: [place] In-house Referral: Clinical Social Work   Post Acute Care Choice: [place] (Needs LTC bed) Living arrangements for the past 2 months: Homeless Expected Discharge Date: 11/02/20                                     Social Determinants of Health (SDOH) Interventions    Readmission Risk  Interventions Readmission Risk Prevention Plan 12/08/2020  Transportation Screening Complete  Medication Review Press photographer) Complete  PCP or Specialist appointment within 3-5 days of discharge Complete  HRI or [place] Complete  SW Recovery Care/Counseling Consult Complete  [place] Not Applicable  Some recent data might be hidden

## 2020-12-15 NOTE — TOC Progression Note (Addendum)
Transition of Care [facility]) - Progression Note    Patient Details  Name: Heather [name] MRN: [id number] Date of Birth: 12-26-1975  Transition of Care [facility]) CM/SW Rudy, RN Phone Number: 12/15/2020, 8:54 AM  Clinical Narrative:    Case management called and left a message with Dyane Dustman, admission CM at [facility] facilities to explore options for placement for the patient.  No bed offers at this time for a safe discharge plan for this patient.  CM left a secure email for Earnest Bailey, [place] MSW at [email or web address] .[phone] to ask for assistance with processing patient's Medicaid and need for facility placement for the patient.  CM and and left a return message with Cleotis Lema, [place] with [place] with Greta Doom [phone] to explore options for housing and assistance for this patient.  CM and MSW with [facility] will continue to explore options for placement for this patient.   Expected Discharge Plan: Skilled Nursing Facility Barriers to Discharge: [place] ([place]), Homeless with medical needs (Homeless)  Expected Discharge Plan and Services Expected Discharge Plan: [place] In-house Referral: Clinical Social Work   Post Acute Care Choice: [place] (Needs LTC bed) Living arrangements for the past 2 months: Homeless Expected Discharge Date: 11/02/20                                     Social Determinants of Health (SDOH) Interventions    Readmission Risk Interventions Readmission Risk Prevention Plan 12/08/2020  Transportation Screening Complete  Medication Review Press photographer) Complete  PCP or Specialist appointment within 3-5 days of discharge Complete  HRI or [place] Complete  SW Recovery Care/Counseling Consult Complete  [place] Not Applicable  Some recent data might be hidden

## 2020-12-16 LAB — BASIC METABOLIC PANEL
Anion gap: 8 (ref 5–15)
BUN: 20 mg/dL (ref 6–20)
CO2: 21 mmol/L — ABNORMAL LOW (ref 22–32)
Calcium: 8.9 mg/dL (ref 8.9–10.3)
Chloride: 108 mmol/L (ref 98–111)
Creatinine, Ser: 1.21 mg/dL — ABNORMAL HIGH (ref 0.44–1.00)
GFR, Estimated: 57 mL/min — ABNORMAL LOW (ref >60)
Glucose, Bld: 85 mg/dL (ref 70–99)
Potassium: 3.9 mmol/L (ref 3.5–5.1)
Sodium: 137 mmol/L (ref 135–145)

## 2020-12-16 MED ORDER — FUROSEMIDE 40 MG PO TABS
40.0000 mg | ORAL_TABLET | Freq: Once | ORAL | Status: AC
  Administered 2020-12-16: 40 mg via ORAL
  Filled 2020-12-16: qty 1

## 2020-12-16 NOTE — Plan of Care (Signed)
  Problem: Health Behavior/Discharge Planning: Goal: Ability to manage health-related needs will improve Outcome: Progressing   Problem: Activity: Goal: Risk for activity intolerance will decrease Outcome: Progressing   

## 2020-12-16 NOTE — Progress Notes (Signed)
PROGRESS NOTE    Heather [name]  [id number] DOB: 04/24/1975 DOA: 10/30/2020 PCP: Lucianne Lei, MD   Chief Complaint  Patient presents with   BH/CP  Brief Narrative: 46 year old female with multiple comorbidities including morbid obesity , T2DM, OSA/COPD, MDD, GAD, insomnia, RLS, renal mass, numerous small wounds under her pannus upper thighs and between her legs presented to the ED 7/25 with right flank and suprapubic pain, nausea vomiting and was admitted for complicated UTI. Treated with antibiotics seen by PT OT has been stable for discharge awaiting on placement to skilled nursing facility. But due to patient's homelessness status  insurance isseus, disposition has been difficulty.  Subjective: Seen this morning reports she feels her legs are swollen and requesting to resume her Lasix reports was taking 40 mg daily.   No overnight events.  No new complaints.  Assessment & Plan:  Complicated UTI Enterobacter clonization Nonobstructing nephrolithiasis-started on calculus on the left Renal cyst 8.3 cm right upper pole At this time asymptomatic.  Patient will need urology follow-up.  Afebrile..   Suicidal ideation-None currently Generalized anxiety/depression Insomnia Seen by psychiatry cleared for discharge .  Mood stable on current meds-Wellbutrin, trazodone, Topamax, Neurontin, prn clonazepam .   Suspected OSA/OHS-monitor nocturnal oximetry-no hypoxia,   Right hip/back/leg pain Known disc herniation on imaging-thoracic and lumbar spine Continue pain control Lyrica, meloxicam antispasm meds cont PTOT.  Because of chronic pain from this condition patient has not been able to obtain appropriate employment.   RL continue Requip   CKD stage IIIb-off Lasix monitor closely on NSAID.  Leg swelling, b/l mild-appears lymphedematous with obesity- she is requesting Lasix we will dose x1   Morbid obesity with BMI  76: Will benefit with outpatient follow-up weight loss  lifestyle   Yeast panniculitis-improved.  Continue nystatin   Generalized deconditioning/weakness/homelessness/difficulty with disposition: Pending SNF placement.  Status post COVID-vaccine Moderna x1 8/1 and Delta x1 5/23  Difficult placement TOC following: Multiple places unable to offer bed due to her overweight  Diet Order             Diet regular Room service appropriate? Yes; Fluid consistency: Thin  Diet effective now           Diet general                  Patient's Body mass index is 75.76 kg/m. DVT prophylaxis:   Lovenox, encourage ambulation Code Status:   Code Status: Full Code  Family Communication: plan of care discussed with patient at bedside. Status is: Inpatient Remains inpatient appropriate because:Unsafe d/c plan Dispo:  Patient From: homeless   Planned Disposition: [place]  Medically stable for discharge Difficult to place. Unresulted Labs (From admission, onward)     Start     Ordered   11/07/20 0500  Creatinine, serum  (enoxaparin (LOVENOX)    CrCl >/= 30 ml/min)  Weekly,   R     Comments: while on enoxaparin therapy    10/31/20 1548           Medications reviewed:  Scheduled Meds:  ascorbic acid  500 mg Oral Daily   bacitracin   Topical BID   buPROPion ER  200 mg Oral BID   clotrimazole   Topical BID   COVID-19 mRNA Vac-TriS (Pfizer)  0.3 mL Intramuscular Once   diclofenac Sodium  4 g Topical QID   enoxaparin (LOVENOX) injection  100 mg Subcutaneous Q24H   fluticasone  2 spray Each Nare BID  methocarbamol  500 mg Oral QID   mometasone-formoterol  2 puff Inhalation BID   montelukast  10 mg Oral QHS   nystatin   Topical TID   pantoprazole  40 mg Oral Daily   pregabalin  100 mg Oral TID   rOPINIRole  4 mg Oral QHS   senna-docusate  2 tablet Oral Daily   sodium chloride flush  3 mL Intravenous Q12H   topiramate  25 mg Oral BID   traZODone  50 mg Oral QHS   umeclidinium bromide  1 puff Inhalation Daily    Continuous Infusions:  sodium chloride     Consultants:see note  Procedures:see note Antimicrobials: Anti-infectives (From admission, onward)    Start     Dose/Rate Route Frequency Ordered Stop   11/01/20 1000  ceFEPIme (MAXIPIME) 2 g in sodium chloride 0.9 % 100 mL IVPB        2 g 200 mL/hr over 30 Minutes Intravenous Every 12 hours 10/31/20 1550 11/03/20 2309   11/01/20 0600  ceFEPIme (MAXIPIME) 2 g in sodium chloride 0.9 % 100 mL IVPB  Status:  Discontinued        2 g 200 mL/hr over 30 Minutes Intravenous Every 8 hours 10/31/20 1541 10/31/20 1550   10/31/20 1000  ertapenem (INVANZ) 1,000 mg in sodium chloride 0.9 % 100 mL IVPB  Status:  Discontinued        1 g 200 mL/hr over 30 Minutes Intravenous Every 24 hours 10/31/20 0948 10/31/20 1541      Culture/Microbiology    Component Value Date/Time   SDES  10/26/2020 1800    URINE, CLEAN CATCH Performed at [facility], [place] [address], [place], [place] [address]    SPECREQUEST  10/26/2020 1800    NONE Performed at [facility], [place] [address] [place], [place] [address]    CULT MULTIPLE SPECIES PRESENT, SUGGEST RECOLLECTION (A) 10/26/2020 1800   REPTSTATUS 10/28/2020 FINAL 10/26/2020 1800    Other culture-see note  Objective: Vitals: Today's Vitals   12/16/20 0631 12/16/20 0925 12/16/20 0933 12/16/20 0934  BP: (!) 94/51 108/60    Pulse: 75 80    Resp: 16 18    Temp: 97.8 F (36.6 C) 98.7 F (37.1 C)    TempSrc: Oral Oral    SpO2: 100% 98% 96% 96%  Weight:      Height:      PainSc:        Intake/Output Summary (Last 24 hours) at 12/16/2020 1136 Last data filed at 12/16/2020 0600 Gross per 24 hour  Intake 240 ml  Output --  Net 240 ml    Filed Weights   11/26/20 0500 11/27/20 0546 12/03/20 0600  Weight: (!) 196 kg (!) 196 kg (!) 194 kg   Weight change:   Intake/Output from previous day: 09/09 0701 - 09/10 0700 In: 240 [P.O.:240] Out: -  Intake/Output this  shift: No intake/output data recorded. Filed Weights   11/26/20 0500 11/27/20 0546 12/03/20 0600  Weight: (!) 196 kg (!) 196 kg (!) 194 kg   Examination: General exam: AAO, morbidly obese, pleasant,weak appearing. HEENT:Oral mucosa moist, Ear/Nose WNL grossly, dentition normal. Respiratory system: bilaterally diminished, no use of accessory muscle Cardiovascular system: S1 & S2 +, No JVD,. Gastrointestinal system: Abdomen soft, NT,ND, BS+ Nervous System:Alert, awake, moving extremities and grossly nonfocal Extremities: chronic appearing leg edema, distal peripheral pulses palpable.  Skin: No rashes,no icterus. MSK: Normal muscle bulk,tone, power   Data Reviewed: I have personally reviewed  following labs and imaging studies CBC: No results for input(s): WBC, NEUTROABS, HGB, HCT, MCV, PLT in the last 168 hours. Basic Metabolic Panel: Recent Labs  Lab 12/12/20 0152 12/16/20 0315  NA  --  137  K  --  3.9  CL  --  108  CO2  --  21*  GLUCOSE  --  85  BUN  --  20  CREATININE 1.33* 1.21*  CALCIUM  --  8.9    GFR: Estimated Creatinine Clearance: 104.2 mL/min (A) (by C-G formula based on SCr of 1.21 mg/dL (H)). Liver Function Tests: No results for input(s): AST, ALT, ALKPHOS, BILITOT, PROT, ALBUMIN in the last 168 hours. No results for input(s): LIPASE, AMYLASE in the last 168 hours. No results for input(s): AMMONIA in the last 168 hours. Coagulation Profile: No results for input(s): INR, PROTIME in the last 168 hours. Cardiac Enzymes: No results for input(s): CKTOTAL, CKMB, CKMBINDEX, TROPONINI in the last 168 hours. BNP (last 3 results) No results for input(s): PROBNP in the last 8760 hours. HbA1C: No results for input(s): HGBA1C in the last 72 hours. CBG: No results for input(s): GLUCAP in the last 168 hours. Lipid Profile: No results for input(s): CHOL, HDL, LDLCALC, TRIG, CHOLHDL, LDLDIRECT in the last 72 hours. Thyroid Function Tests: No results for input(s): TSH,  T4TOTAL, FREET4, T3FREE, THYROIDAB in the last 72 hours. Anemia Panel: No results for input(s): VITAMINB12, FOLATE, FERRITIN, TIBC, IRON, RETICCTPCT in the last 72 hours. Sepsis Labs: No results for input(s): PROCALCITON, LATICACIDVEN in the last 168 hours.  No results found for this or any previous visit (from the past 240 hour(s)).   Radiology Studies: No results found.   LOS: 36 days   Antonieta Pert, MD [facility]  12/16/2020, 11:36 AM

## 2020-12-17 MED ORDER — DOXYCYCLINE HYCLATE 100 MG PO TABS
100.0000 mg | ORAL_TABLET | Freq: Two times a day (BID) | ORAL | Status: AC
  Administered 2020-12-17 – 2020-12-24 (×16): 100 mg via ORAL
  Filled 2020-12-17 (×16): qty 1

## 2020-12-17 NOTE — Plan of Care (Signed)
  Problem: Health Behavior/Discharge Planning: Goal: Ability to manage health-related needs will improve Outcome: Progressing   Problem: Activity: Goal: Risk for activity intolerance will decrease Outcome: Progressing   

## 2020-12-17 NOTE — Progress Notes (Signed)
PROGRESS NOTE    Heather [name]  [id number] DOB: [birth year] DOA: 10/30/2020 PCP: Lucianne Lei, MD   Chief Complaint  Patient presents with   BH/CP  Brief Narrative: 45 year old female with multiple comorbidities including morbid obesity , T2DM, OSA/COPD, MDD, GAD, insomnia, RLS, renal mass, numerous small wounds under her pannus upper thighs and between her legs presented to the ED 7/25 with right flank and suprapubic pain, nausea vomiting and was admitted for complicated UTI. Treated with antibiotics seen by PT OT has been stable for discharge awaiting on placement to skilled nursing facility. But due to patient's homelessness status  insurance isseus, disposition has been difficulty.  Subjective:  Complains of cyst/acne eruption with redness on the upper back and left shoulder No fever no nausea.  Voided well after dose of Lasix yesterday  Assessment & Plan:  Complicated UTI Enterobacter clonization Nonobstructing nephrolithiasis-started on calculus on the left Renal cyst 8.3 cm right upper pole At this time asymptomatic.  Patient will need urology follow-up.  Afebrile..   Suicidal ideation-None currently Generalized anxiety/depression Insomnia Seen by psychiatry cleared for discharge .  Mood stable on current meds-Wellbutrin, trazodone, Topamax, Neurontin, prn clonazepam .   Suspected OSA/OHS-monitor nocturnal oximetry-no hypoxia,  ?  Boil/acne/cyst eruption on upper back we will dose doxycycline x7 days watch for any other signs of infection.  Right hip/back/leg pain Known disc herniation on imaging-thoracic and lumbar spine Continue pain control Lyrica, meloxicam antispasm meds cont PTOT.  Because of chronic pain from this condition patient has not been able to obtain appropriate employment.   RL continue Requip   CKD stage IIIb-off Lasix monitor closely on NSAID.  Leg swelling, b/l mild-appears lymphedematous with obesity-s/p PO Lasix x1 9/10-reassess and  can dose with intermittent Lasix.   Morbid obesity with BMI  76: Will benefit with outpatient follow-up weight loss lifestyle   Yeast panniculitis-improved.  Continue nystatin   Generalized deconditioning/weakness/homelessness/difficulty with disposition: Pending SNF placement.  Status post COVID-vaccine Moderna x1 8/1 and [place] x1 5/23  Difficult placement TOC following: Multiple places unable to offer bed due to her overweight  Diet Order             Diet regular Room service appropriate? Yes; Fluid consistency: Thin  Diet effective now           Diet general                  Patient's Body mass index is 75.76 kg/m. DVT prophylaxis:   Lovenox, encourage ambulation Code Status:   Code Status: Full Code  Family Communication: plan of care discussed with patient at bedside. Status is: Inpatient Remains inpatient appropriate because:Unsafe d/c plan Dispo:  Patient From: homeless   Planned Disposition: [place]  Medically stable for discharge Difficult to place. Unresulted Labs (From admission, onward)     Start     Ordered   11/07/20 0500  Creatinine, serum  (enoxaparin (LOVENOX)    CrCl >/= 30 ml/min)  Weekly,   R     Comments: while on enoxaparin therapy    10/31/20 1548           Medications reviewed:  Scheduled Meds:  ascorbic acid  500 mg Oral Daily   bacitracin   Topical BID   buPROPion ER  200 mg Oral BID   clotrimazole   Topical BID   diclofenac Sodium  4 g Topical QID   doxycycline  100 mg Oral Q12H   enoxaparin (LOVENOX) injection  100  mg Subcutaneous Q24H   fluticasone  2 spray Each Nare BID   methocarbamol  500 mg Oral QID   mometasone-formoterol  2 puff Inhalation BID   montelukast  10 mg Oral QHS   nystatin   Topical TID   pantoprazole  40 mg Oral Daily   pregabalin  100 mg Oral TID   rOPINIRole  4 mg Oral QHS   senna-docusate  2 tablet Oral Daily   sodium chloride flush  3 mL Intravenous Q12H   topiramate  25 mg Oral  BID   traZODone  50 mg Oral QHS   umeclidinium bromide  1 puff Inhalation Daily   Continuous Infusions:  sodium chloride     Consultants:see note  Procedures:see note Antimicrobials: Anti-infectives (From admission, onward)    Start     Dose/Rate Route Frequency Ordered Stop   12/17/20 1030  doxycycline (VIBRA-TABS) tablet 100 mg        100 mg over 7 Days Oral Every 12 hours 12/17/20 0942     11/01/20 1000  ceFEPIme (MAXIPIME) 2 g in sodium chloride 0.9 % 100 mL IVPB        2 g 200 mL/hr over 30 Minutes Intravenous Every 12 hours 10/31/20 1550 11/03/20 2309   11/01/20 0600  ceFEPIme (MAXIPIME) 2 g in sodium chloride 0.9 % 100 mL IVPB  Status:  Discontinued        2 g 200 mL/hr over 30 Minutes Intravenous Every 8 hours 10/31/20 1541 10/31/20 1550   10/31/20 1000  ertapenem (INVANZ) 1,000 mg in sodium chloride 0.9 % 100 mL IVPB  Status:  Discontinued        1 g 200 mL/hr over 30 Minutes Intravenous Every 24 hours 10/31/20 0948 10/31/20 1541      Culture/Microbiology    Component Value Date/Time   SDES  10/26/2020 1800    URINE, CLEAN CATCH Performed at [facility], [place] [address], [place], [place] [address]    SPECREQUEST  10/26/2020 1800    NONE Performed at [facility], [place] [address], [place], [place] [address]    CULT MULTIPLE SPECIES PRESENT, SUGGEST RECOLLECTION (A) 10/26/2020 1800   REPTSTATUS 10/28/2020 FINAL 10/26/2020 1800    Other culture-see note  Objective: Vitals: Today's Vitals   12/16/20 2237 12/17/20 0530 12/17/20 0757 12/17/20 1016  BP:  102/65  (!) 112/52  Pulse:  71  77  Resp:  '16 16 19  '$ Temp:  97.8 F (36.6 C)  97.9 F (36.6 C)  TempSrc:  Oral    SpO2:  100% 100% 97%  Weight:      Height:      PainSc: Asleep       Intake/Output Summary (Last 24 hours) at 12/17/2020 1047 Last data filed at 12/17/2020 [id number] Gross per 24 hour  Intake 1000 ml  Output --  Net 1000 ml    Filed Weights   11/26/20  0500 11/27/20 0546 12/03/20 0600  Weight: (!) 196 kg (!) 196 kg (!) 194 kg   Weight change:   Intake/Output from previous day: 09/10 0701 - 09/11 0700 In: 340 [P.O.:340] Out: -  Intake/Output this shift: Total I/O In: 660 [P.O.:660] Out: -  Filed Weights   11/26/20 0500 11/27/20 0546 12/03/20 0600  Weight: (!) 196 kg (!) 196 kg (!) 194 kg   Examination: General exam: AAOx 3, morbidly obese, older than stated age, weak appearing. HEENT:Oral mucosa moist, Ear/Nose WNL grossly, dentition normal. Respiratory system: bilaterally diminished, ,  no use of accessory muscle Cardiovascular system: S1 & S2 +, No JVD,. Gastrointestinal system: Abdomen soft, NT,ND, BS+ Nervous System:Alert, awake, moving extremities and grossly nonfocal Extremities: Chronic lymphedematous leg Skin: No rashes,no icterus.  Swelling with mild erythema on the upper back and left shoulder. MSK: Normal muscle bulk,tone, power   Data Reviewed: I have personally reviewed following labs and imaging studies CBC: No results for input(s): WBC, NEUTROABS, HGB, HCT, MCV, PLT in the last 168 hours. Basic Metabolic Panel: Recent Labs  Lab 12/12/20 0152 12/16/20 0315  NA  --  137  K  --  3.9  CL  --  108  CO2  --  21*  GLUCOSE  --  85  BUN  --  20  CREATININE 1.33* 1.21*  CALCIUM  --  8.9    GFR: Estimated Creatinine Clearance: 104.2 mL/min (A) (by C-G formula based on SCr of 1.21 mg/dL (H)). Liver Function Tests: No results for input(s): AST, ALT, ALKPHOS, BILITOT, PROT, ALBUMIN in the last 168 hours. No results for input(s): LIPASE, AMYLASE in the last 168 hours. No results for input(s): AMMONIA in the last 168 hours. Coagulation Profile: No results for input(s): INR, PROTIME in the last 168 hours. Cardiac Enzymes: No results for input(s): CKTOTAL, CKMB, CKMBINDEX, TROPONINI in the last 168 hours. BNP (last 3 results) No results for input(s): PROBNP in the last 8760 hours. HbA1C: No results for input(s):  HGBA1C in the last 72 hours. CBG: No results for input(s): GLUCAP in the last 168 hours. Lipid Profile: No results for input(s): CHOL, HDL, LDLCALC, TRIG, CHOLHDL, LDLDIRECT in the last 72 hours. Thyroid Function Tests: No results for input(s): TSH, T4TOTAL, FREET4, T3FREE, THYROIDAB in the last 72 hours. Anemia Panel: No results for input(s): VITAMINB12, FOLATE, FERRITIN, TIBC, IRON, RETICCTPCT in the last 72 hours. Sepsis Labs: No results for input(s): PROCALCITON, LATICACIDVEN in the last 168 hours.  No results found for this or any previous visit (from the past 240 hour(s)).   Radiology Studies: No results found.   LOS: 10 days   Antonieta Pert, MD [facility]  12/17/2020, 10:47 AM

## 2020-12-18 NOTE — Progress Notes (Signed)
Physical Therapy Treatment Patient Details Name: Heather [name] MRN: [id number] DOB: 10-Oct-1975 Today's Date: 12/18/2020   History of Present Illness 45 y.o. female presented to ED for chest pain and palpitations and feeling like her throat was closing while at her behavioral health facility for SI. Admitted 10/31/20 for treatment of complicated UTI PMH: anxiety and depression (hx of bipolar and schizoaffective disorder in chart in past), ACD, panniculitis, debility,GERD,  OSA not on cpap, chronic back pain.    PT Comments    [facility] PT session: Pt asleep in bed with boyfriend on entry, rouses easily and is agreeable to working with therapy. Focus of session functional mobility. Pt able to get to EoB and get up with supervision. Sat in very low sofa with decreased eccentric control. While sitting on sofa pt able to reach feet and don her shoes. Attempted to stand with armrest on L, when unable to powerup, pt scooted along edge of sofa to R armrest. With mod A from therapist and heavy use of R armrest, and increased time pt able to come to standing. Pt very proud of herself. Able to ambulate in hallway with increased DoE today. With return to room, sitting in recliner and removal of mask pt reports feeling better. Pt able to participate in sit>stand x 4, SLS, and marching. PT to take pt outside tomorrow. Pt continues to make progress but still requires assist for self care and mobility. PT will continue to work with pt daily.      Recommendations for follow up therapy are one component of a multi-disciplinary discharge planning process, led by the attending physician.  Recommendations may be updated based on patient status, additional functional criteria and insurance authorization.  Follow Up Recommendations  Outpatient PT (pt too high level for SNF, case management team searching for homeless shelter for patient)     Equipment Recommendations  None recommended by PT (owns bariatric RW)        Precautions / Restrictions Precautions Precautions: Fall Precaution Comments: fell prior to hosp     Mobility  Bed Mobility Overal bed mobility: Needs Assistance Bed Mobility: Supine to Sit;Sit to Supine     Supine to sit: Supervision     General bed mobility comments: increased time, use of rails    Transfers Overall transfer level: Needs assistance Equipment used: None Transfers: Sit to/from Stand Sit to Stand: Supervision;Mod assist         General transfer comment: initially tried sitting on lower sofa seat where she was able to don her shoes independently, requires armrest of R and modA to power up to RW. Pt very happy with progress. practiced 4 additional sit>stand with supervision from recliner.  Ambulation/Gait Ambulation/Gait assistance: Min guard Gait Distance (Feet): 50 Feet Assistive device: Rolling walker (2 wheeled) (bariatric RW) Gait Pattern/deviations: Step-through pattern;Trunk flexed Gait velocity: slowed Gait velocity interpretation: <1.8 ft/sec, indicate of risk for recurrent falls General Gait Details: min guard for safety, pt with increased SoB today with hallway ambulation, pt reports she thinks it because she needs to wear a mask in the hallway          Balance Overall balance assessment: Needs assistance Sitting-balance support: No upper extremity supported;Feet supported Sitting balance-Leahy Scale: Fair     Standing balance support: No upper extremity supported Standing balance-Leahy Scale: Fair Standing balance comment: Reliant on BUE on RW with functional mobility Single Leg Stance - Right Leg: 20 (x2) Single Leg Stance - Left Leg: 30 (x2)  Cognition Arousal/Alertness: Awake/alert Behavior During Therapy: WFL for tasks assessed/performed Overall Cognitive Status: Within Functional Limits for tasks assessed Area of Impairment: Awareness                   Current Attention Level:  Selective     Safety/Judgement: Decreased awareness of safety     General Comments: continues to need increased education on muscle soreness and increasing endurance to be independent      Exercises General Exercises - Lower Extremity Hip Flexion/Marching: AROM;Both;10 reps;Standing Other Exercises Other Exercises: tight circles with RW    General Comments General comments (skin integrity, edema, etc.): VSS on RA, boyfriend in bed with pt on entry      Pertinent Vitals/Pain Pain Assessment: Faces Faces Pain Scale: Hurts little more Pain Location: low back and R hip Pain Descriptors / Indicators: Grimacing;Aching Pain Intervention(s): Limited activity within patient's tolerance;Monitored during session;Repositioned     PT Goals (current goals can now be found in the care plan section) Acute Rehab PT Goals Patient Stated Goal: to reduce pain PT Goal Formulation: With patient Time For Goal Achievement: 12/12/20 Potential to Achieve Goals: Fair Progress towards PT goals: Progressing toward goals    Frequency    Min 3X/week (try to progress activity tolerance to D/C to homeless shelter)      PT Plan Current plan remains appropriate       AM-PAC PT "6 Clicks" Mobility   Outcome Measure  Help needed turning from your back to your side while in a flat bed without using bedrails?: A Little Help needed moving from lying on your back to sitting on the side of a flat bed without using bedrails?: A Little Help needed moving to and from a bed to a chair (including a wheelchair)?: A Little Help needed standing up from a chair using your arms (e.g., wheelchair or bedside chair)?: A Little Help needed to walk in hospital room?: A Little Help needed climbing 3-5 steps with a railing? : A Lot 6 Click Score: 17    End of Session   Activity Tolerance: Patient limited by pain Patient left: with call bell/phone within reach;in chair Nurse Communication: Mobility status PT  Visit Diagnosis: Unsteadiness on feet (R26.81);Muscle weakness (generalized) (M62.81);History of falling (Z91.81) Pain - Right/Left: Right Pain - part of body: Hip (low back)     Time: [id number] PT Time Calculation (min) (ACUTE ONLY): 49 min  Charges:  $Gait Training: 8-22 mins $Therapeutic Exercise: 8-22 mins $Therapeutic Activity: 8-22 mins                     Chanin Frumkin B. Migdalia Dk PT, DPT Acute Rehabilitation Services Pager [phone] Office [phone]    [place] 12/18/2020, 3:23 PM

## 2020-12-18 NOTE — TOC Progression Note (Addendum)
Transition of Care [facility]) - Progression Note    Patient Details  Name: Heather [name] MRN: [id number] Date of Birth: 12-04-1975  [facility] CM/SW [place], RN Phone Number: 12/18/2020, 11:30 AM  Clinical Narrative:    Case management called and spoke with Monia Pouch, CM at [facility] to check on bed availability at the facility - CM at facility will check and follow up with [facility] team regarding possible bed availability.  CM called and left a message with [name], CM at [facility] [phone] to check on possible bed offer at one of the ALF facilities.  CM called and left a message with [facility] in [place], [place] is CM at the facility and clinicals were sent to leadership at the facility to review on Friday, 12/15/2020.  CM and MSW will continue to follow the patient for ALF versus SNF placement.  12/18/2020 1147 - CM spoke with Lucita Ferrara, CM in admissions at [facility] in [place], and she will speak to [place], DON at facility about potential bed offer.  The patient, if accepted, will be able to transport by [facility] transport versus [facility] since the patient is able to ambulate with rolling walker.   Expected Discharge Plan: Skilled Nursing Facility Barriers to Discharge: [place] ([place]), Homeless with medical needs (Homeless)  Expected Discharge Plan and Services Expected Discharge Plan: [place] In-house Referral: Clinical Social Work   Post Acute Care Choice: [place] (Needs LTC bed) Living arrangements for the past 2 months: Homeless Expected Discharge Date: 11/02/20                                     Social Determinants of Health (SDOH) Interventions    Readmission Risk Interventions Readmission Risk Prevention Plan 12/08/2020  Transportation Screening Complete  Medication Review Press photographer) Complete  PCP or  Specialist appointment within 3-5 days of discharge Complete  HRI or [place] Complete  SW Recovery Care/Counseling Consult Complete  [place] Not Applicable  Some recent data might be hidden

## 2020-12-18 NOTE — Progress Notes (Signed)
[facility] PROGRESS NOTE  Claryssa Hilde Sortino [id number] DOB: Jun 17, 1975 DOA: 10/30/2020 PCP: Lucianne Lei, MD  Status: Remains inpatient appropriate because:Unsafe d/c plan  Dispo:  Patient From:  Homeless-newly homeless since May 2022  Planned Disposition: ALF??  Medically stable for discharge: Yes  Barriers to discharge: Morbid obesity/disability pending    Level of care: Med-Surg  Code Status: Full Family Communication: Patient only DVT prophylaxis: Lovenox COVID vaccination status: Moderna x1 on 11/06/2020  and Pfizer x1 on 08/28/2020    HPI: 45 y.o. female with PMH significant for morbid obesity, DM2, obstructive sleep apnea, COPD, MDD, GAD, insomnia and RLS,renal mass,  and numerous small wounds under her pannus, upper thighs and between her legs.   Patient presented to the ED on 7/25 presented with right flank and suprapubic pain along with nausea and vomiting.   She was admitted and treated for complicated UTI with antibiotics and has completed treatment.  Currently medically stable for discharge to SNF.  Subjective: Awake and alert watching movies.  Boyfriend in room.  Boyfriend states he is going to encourage patient to get up today for mobilization.   Objective: Vitals:   12/17/20 2045 12/18/20 0448  BP:  (!) 97/56  Pulse:  70  Resp:  19  Temp:  97.8 F (36.6 C)  SpO2: 98%     Intake/Output Summary (Last 24 hours) at 12/18/2020 0834 Last data filed at 12/17/2020 2200 Gross per 24 hour  Intake 1200 ml  Output --  Net 1200 ml   Filed Weights   11/26/20 0500 11/27/20 0546 12/03/20 0600  Weight: (!) 196 kg (!) 196 kg (!) 194 kg    Exam:  Constitutional: NAD, calm, reports continued pain in back and [name]'s especially while working with therapy. Respiratory: Posterior lung sounds are clear to auscultation but diminished throughout, stable on room air.  No increased work of breathing while sitting on side of the bed or with repositioning back  into bed with assistance Cardiovascular: Heart sounds are normal, normotensive, pulses regular Abdomen: Soft and nontender.  Obese.  Bowel sounds positive. LBM 9/8, eating well Skin: Several areas consistent with cystic acne on upper back below neck as well as left shoulder-proved as compared to 9/9 Neurologic: CN 2-12 grossly intact. Sensation intact, Strength 4/5 x all 4 extremities.  Psychiatric: Normal judgment and insight. Alert and oriented x 3.    Assessment/Plan: Acute problems: Complicated UTI presumed 2/2 Enterobacter colonization -Patient completed 3 days of cefepime, urine culture colonized,   Nonobstructing nephrolithiasis Renal cyst -CT abdomen and pelvis from 7/26 showed bilateral nonobstructing stones including a staghorn calculus on the left.  Can follow-up with urology as an outpatient. -CT scan abdomen from 7/19 also showed up 8.3 cm right upper pole renal cyst likely benign.   Suicidal ideation/anxiety and depression -Psychiatry has cleared the patient for discharge.   -Continue Wellbutrin, trazodone, and prn clonazepam -Outpatient follow-up with psychiatry  Suspected OSA/OHS -Patient with O2 saturations between 90 and 94% while sleeping -Unable to follow if any apneic episodes therefore suspect once patient obtains disability and her Medicaid she will need to be referred for a polysomnogram/home sleep study  Right hip/back/leg pain and patient with known disc herniation on imaging -Discussed with IR regarding possible image guided injection of SI joint: 2 radiologists reviewed this patient, one is IR and one is a musculoskeletal rad, they both said that because of this patient's habitus/lack of equipment here in the hospital we won't be able to do an SI  injection. The IR MD's do these in CT however due to her habitus we wouldn't be able to fit her + the needle in the scanner and the MSK MDs do these in [place] with a c-arm but we only have the fixed tables here in the  hospital. You can place an order for this to be attempted at our outpatient spine center ([address]) but it would need to be reviewed by the radiologists there before it would be scheduled.  -Given size of leg and knee as well as current skin breakdown she is not a candidate for splint or other immobilizing device to right knee. -Back pain (chronic).  Better after resumption of outpatient pain medications (Topamax, meloxicam and Robaxin).  -Lyrica initiated this admission-increase to 100 mg on 9/8 and add short-term Oxy IR every 6 hour prn to help aid in participation with therapy -Gel pad ordered for bariatric chair --Previous LS MR from July 2020 w/L2-3 disc herniation with associated stenosis left; she was also found to have T12-L1 central disc herniation with upward migration.  Repeat MRI this admission essentially unchanged.  Asthma -Patient complaining of nocturnal wheezing and difficulty breathing which is not improved with use of albuterol rescue inhaler and is requesting nebulizer -Discontinue albuterol MDI in favor of DuoNebs as needed -We will start patient on long-acting agents: Dulera as well as Umeclidinium -Continue Singulair  CKD 3B -Used to be on indapamide in the past.   --Creatinine stable with GFR less than 60 and with resumption of meloxicam creatinine stable at 1.38 with previous reading 1.25   Generalized deconditioning Homelessness -PT/OT initially recommended SNF.  History recent therapy sessions demonstrate increased independence with mobility when utilizing rolling walker. -Primary barriers to ambulation are patient's motivation and ongoing back pain which is being treated as above as well as her known thoracic and lumbar disc herniation causing chronic pain which is compounded by her morbid obesity  Thoracic and lumbar spine disc herniation -Has difficulty with mobility and chronic pain secondary to disc herniation which is significantly compounded by  patient's profound morbid obesity -Because of chronic pain from this condition patient has not been able to obtain appropriate employment as the jobs she is qualified for require her to walk, stand or lift which she is unable to do  Cystic acne -Location on back and shoulder -Improved after initiation of oral antibiotics over the weekend   Yeast panniculitis -Significantly improved. -Continue nystatin powder to all of intertriginous areas (TID) -Instructions given for staff to wash all intertriginous areas with water and pat dry before applying a light dusting of the nystatin powder   Morbid obesity  -Body mass index is 76.53 kg/m. Patient has been advised to make an attempt to improve diet and exercise patterns to aid in weight loss.   Generalized anxiety disorder/restless legs -Continue Wellbutrin, BuSpar, Neurontin, Requip.      Data Reviewed: Basic Metabolic Panel: Recent Labs  Lab 12/12/20 0152 12/16/20 0315  NA  --  137  K  --  3.9  CL  --  108  CO2  --  21*  GLUCOSE  --  85  BUN  --  20  CREATININE 1.33* 1.21*  CALCIUM  --  8.9    Scheduled Meds:  ascorbic acid  500 mg Oral Daily   bacitracin   Topical BID   buPROPion ER  200 mg Oral BID   clotrimazole   Topical BID   diclofenac Sodium  4 g Topical QID  doxycycline  100 mg Oral Q12H   enoxaparin (LOVENOX) injection  100 mg Subcutaneous Q24H   fluticasone  2 spray Each Nare BID   methocarbamol  500 mg Oral QID   mometasone-formoterol  2 puff Inhalation BID   montelukast  10 mg Oral QHS   nystatin   Topical TID   pantoprazole  40 mg Oral Daily   pregabalin  100 mg Oral TID   rOPINIRole  4 mg Oral QHS   senna-docusate  2 tablet Oral Daily   sodium chloride flush  3 mL Intravenous Q12H   topiramate  25 mg Oral BID   traZODone  50 mg Oral QHS   umeclidinium bromide  1 puff Inhalation Daily   Continuous Infusions:  sodium chloride      Principal Problem:   Complicated UTI (urinary tract  infection) Active Problems:   Asthma   PALPITATIONS, OCCASIONAL   Panniculitis   OSA (obstructive sleep apnea)   Major depressive disorder, recurrent episode, moderate (HCC)   Generalized anxiety disorder   Major depression, chronic   Thrombocytosis   Hypokalemia   CKD (chronic kidney disease), stage III ([place])   Consultants: Psychiatry  Procedures: None  Antibiotics: Fosfomycin x1 on 7/19, Macrodantin 7/21 and 7/22 Ertapenem x1 on 7/26 Cefepime 11/02/2027   Time spent: 25 minutes    Erin Hearing ANP  [facility] 7 am - 330 pm/M-F for direct patient care and secure chat Please refer to Amion for contact info 47  days

## 2020-12-18 NOTE — Progress Notes (Signed)
Occupational Therapy Treatment Patient Details Name: Heather [name] MRN: [id number] DOB: [birth year] Today's Date: 12/18/2020   History of present illness 45 y.o. female presented to ED for chest pain and palpitations and feeling like her throat was closing while at her behavioral health facility for SI. Admitted 10/31/20 for treatment of complicated UTI PMH: anxiety and depression (hx of bipolar and schizoaffective disorder in chart in past), ACD, panniculitis, debility,GERD,  OSA not on cpap, chronic back pain.   OT comments  Patient seen for self care to increase independence for safe discharge from hospital.  Patient appeared to have increased motivation toward OT.  Patient was supervision to min guard for transfers and able to bathe self in shower using LH sponge.  Patient's boyfriend arrived during session and began assisting patient with dressing. He was informed that patient has increased independence with self care using AE and would benefit from allowing her to perform.  Patient would benefit from further OT treatment.    Recommendations for follow up therapy are one component of a multi-disciplinary discharge planning process, led by the attending physician.  Recommendations may be updated based on patient status, additional functional criteria and insurance authorization.    Follow Up Recommendations  Other (comment)    Equipment Recommendations  3 in 1 bedside commode;Tub/shower bench;Other (comment)    Recommendations for Other Services      Precautions / Restrictions Precautions Precautions: Fall Precaution Comments: fell prior to hosp       Mobility Bed Mobility Overal bed mobility: Needs Assistance Bed Mobility: Supine to Sit     Supine to sit: Supervision     General bed mobility comments: increased time, use of rails    Transfers Overall transfer level: Needs assistance Equipment used: Rolling walker (2 wheeled) Transfers: Sit to/from Stand Sit to  Stand: Supervision Stand pivot transfers: Min guard       General transfer comment: min guard for shower transfers    Balance Overall balance assessment: Needs assistance Sitting-balance support: No upper extremity supported;Feet supported Sitting balance-Leahy Scale: Fair Sitting balance - Comments: Sitting on EOB without UE support   Standing balance support: No upper extremity supported;During functional activity Standing balance-Leahy Scale: Fair Standing balance comment: Reliant on BUE on RW with functional mobility                           ADL either performed or assessed with clinical judgement   ADL Overall ADL's : Needs assistance/impaired     Grooming: Brushing hair Grooming Details (indicate cue type and reason): setup only Upper Body Bathing: Set up;Sitting Upper Body Bathing Details (indicate cue type and reason): used LH sponge for back Lower Body Bathing: Supervison/ safety Lower Body Bathing Details (indicate cue type and reason): using LH sponge Upper Body Dressing : Set up Upper Body Dressing Details (indicate cue type and reason): Donned pullover top Lower Body Dressing: Moderate assistance Lower Body Dressing Details (indicate cue type and reason): Patient's boyfriend was present and assisted patient with LB dressing Toilet Transfer: Supervision/safety;RW Toilet Transfer Details (indicate cue type and reason): BSC over toilet in bathroom Toileting- Clothing Manipulation and Hygiene: Set up;Sitting/lateral lean Toileting - Clothing Manipulation Details (indicate cue type and reason): perineal cleaning Tub/ Shower Transfer: Walk-in shower;Min guard;Cueing for safety;3 in 1 Tub/Shower Transfer Details (indicate cue type and reason): min guard for walk in shower with small ledge to step over Functional mobility during ADLs: Rolling walker;Supervision/safety General ADL Comments:  Supervision from bed to bathroom and bathroom to recliner      Vision       Perception     Praxis      Cognition Arousal/Alertness: Awake/alert Behavior During Therapy: WFL for tasks assessed/performed Overall Cognitive Status: Within Functional Limits for tasks assessed Area of Impairment: Awareness                   Current Attention Level: Selective     Safety/Judgement: Decreased awareness of safety Awareness: Intellectual Problem Solving: Slow processing General Comments: continues to need increased education on muscle soreness and increasing endurance to be independent        Exercises     Shoulder Instructions       General Comments      Pertinent Vitals/ Pain       Pain Assessment: Faces Faces Pain Scale: Hurts a little bit Pain Location: low back and R hip Pain Descriptors / Indicators: Grimacing;Aching Pain Intervention(s): Monitored during session  Home Living                                          Prior Functioning/Environment              Frequency  Min 5X/week        Progress Toward Goals  OT Goals(current goals can now be found in the care plan section)  Progress towards OT goals: Progressing toward goals  Acute Rehab OT Goals Patient Stated Goal: to reduce pain OT Goal Formulation: With patient Time For Goal Achievement: 12/05/20 Potential to Achieve Goals: Fair ADL Goals Pt Will Perform Grooming: with modified independence;standing Pt Will Perform Lower Body Bathing: with modified independence;sit to/from stand;sitting/lateral leans;with adaptive equipment Pt Will Perform Lower Body Dressing: with modified independence;with adaptive equipment;sitting/lateral leans;sit to/from stand Pt Will Transfer to Toilet: with modified independence;bedside commode;ambulating Pt Will Perform Toileting - Clothing Manipulation and hygiene: with modified independence;with adaptive equipment;sitting/lateral leans;sit to/from stand Pt Will Perform Tub/Shower Transfer: with  modified independence;rolling walker  Plan Discharge plan remains appropriate    Co-evaluation                 AM-PAC OT "6 Clicks" Daily Activity     Outcome Measure   Help from another person eating meals?: None Help from another person taking care of personal grooming?: A Little Help from another person toileting, which includes using toliet, bedpan, or urinal?: A Little Help from another person bathing (including washing, rinsing, drying)?: A Little Help from another person to put on and taking off regular upper body clothing?: A Little Help from another person to put on and taking off regular lower body clothing?: A Little 6 Click Score: 19    End of Session Equipment Utilized During Treatment: Rolling walker  OT Visit Diagnosis: Unsteadiness on feet (R26.81);Other abnormalities of gait and mobility (R26.89);Muscle weakness (generalized) (M62.81);Pain;Other (comment) Pain - Right/Left: Right Pain - part of body: Hip;Knee   Activity Tolerance Patient limited by fatigue   Patient Left in chair;with call bell/phone within reach;with family/visitor present   Nurse Communication Mobility status        Time: [id number] OT Time Calculation (min): 46 min  Charges: OT General Charges $OT Visit: 1 Visit OT Treatments $Self Care/Home Management : 38-52 mins  Lodema Hong, OTA   Kenny Rea Alexis Goodell 12/18/2020, 9:00 AM

## 2020-12-19 LAB — CREATININE, SERUM
Creatinine, Ser: 1.3 mg/dL — ABNORMAL HIGH (ref 0.44–1.00)
GFR, Estimated: 53 mL/min — ABNORMAL LOW (ref >60)

## 2020-12-19 NOTE — Progress Notes (Signed)
CSW spoke with Juliann Pulse at [facility] who states the facility does not have any LTC beds at this time - Juliann Pulse to check with  to determine their bed availability.  [name], MSW, LCSW Transitions of Care  Clinical Social Worker II [phone]

## 2020-12-19 NOTE — Care Plan (Signed)
Patient seen and examined this morning personally.  Reviewed NP's note and discussed.  No change in current care plan she is waiting for placement  Issues addressed  Complicated UTI/Enterobacter colonization Nonobstructive nephrolithiasis Renal cyst  Cystic acne improving on doxycycline  GAD/insomnia/anxiety, resolved suicidal ideation-continue current psych meds Morbid obesity/chronic back pain known disc herniation chronic hip leg pain: Continue pain management PT OT RLS CKD stage IIIb

## 2020-12-19 NOTE — Progress Notes (Signed)
Physical Therapy Treatment Patient Details Name: Heather [name] MRN: [id number] DOB: 05/16/1975 Today's Date: 12/19/2020   History of Present Illness 45 y.o. female presented to ED for chest pain and palpitations and feeling like her throat was closing while at her behavioral health facility for SI. Admitted 10/31/20 for treatment of complicated UTI PMH: anxiety and depression (hx of bipolar and schizoaffective disorder in chart in past), ACD, panniculitis, debility,GERD,  OSA not on cpap, chronic back pain.    PT Comments    STAR PT session: Trialed wheelchair for increased mobility distance and to go outside since she has been in the hospital for 50 days. Pt worked on propulsion with B UE although she tires easily, requiring min A for steering.  Pt supervision for bed mobility, modA for getting up from low w/c surface and min guard for ambulation with RW. Pt would benefit from a discharge location where she received a little assistance for mobility and personal care and where she can work towards independence. PT will continue to follow acutely.   Recommendations for follow up therapy are one component of a multi-disciplinary discharge planning process, led by the attending physician.  Recommendations may be updated based on patient status, additional functional criteria and insurance authorization.  Follow Up Recommendations  Other (comment);Supervision - Intermittent (ALF)     Equipment Recommendations  None recommended by PT (owns bariatric RW)       Precautions / Restrictions Precautions Precautions: Fall Precaution Comments: fell prior to hosp Restrictions Weight Bearing Restrictions: No     Mobility  Bed Mobility Overal bed mobility: Needs Assistance Bed Mobility: Supine to Sit;Sit to Supine     Supine to sit: Supervision     General bed mobility comments: uses HOB elevated, and rails to pull to side of bed    Transfers Overall transfer level: Needs  assistance Equipment used: None Transfers: Sit to/from Stand Sit to Stand: Mod assist         General transfer comment: modA for power up from low wheelchair seat  Ambulation/Gait Ambulation/Gait assistance: Min guard Gait Distance (Feet): 10 Feet Assistive device: Rolling walker (2 wheeled) (bariatric RW) Gait Pattern/deviations: Step-through pattern;Trunk flexed Gait velocity: slowed Gait velocity interpretation: <1.31 ft/sec, indicative of household ambulator General Gait Details: min guard for safety, pt able to walk into bathroom and manage lower body garments        Wheelchair Mobility Wheelchair Mobility Wheelchair mobility: Yes Wheelchair propulsion: Both upper extremities Wheelchair parts: Needs assistance Distance: 20 (multiple bouts with rest breaks) Wheelchair Assistance Details (indicate cue type and reason): requires min A for navigation when [place] to walls, otherwise maximal verbal cuing for steering         Balance Overall balance assessment: Needs assistance Sitting-balance support: No upper extremity supported;Feet supported Sitting balance-Leahy Scale: Fair     Standing balance support: No upper extremity supported Standing balance-Leahy Scale: Fair Standing balance comment: able to stand to take down pants                            Cognition Arousal/Alertness: Awake/alert Behavior During Therapy: WFL for tasks assessed/performed Overall Cognitive Status: Within Functional Limits for tasks assessed Area of Impairment: Awareness                   Current Attention Level: Selective     Safety/Judgement: Decreased awareness of safety     General Comments: continues to need encouragement but once  she starts is making progress         General Comments General comments (skin integrity, edema, etc.): VSS on RA      Pertinent Vitals/Pain Pain Assessment: No/denies pain     PT Goals (current goals can now be found  in the care plan section) Acute Rehab PT Goals Patient Stated Goal: to reduce pain PT Goal Formulation: With patient Time For Goal Achievement: 12/12/20 Potential to Achieve Goals: Fair Progress towards PT goals: Progressing toward goals    Frequency    Min 3X/week (try to progress activity tolerance to D/C to homeless shelter)      PT Plan Current plan remains appropriate       AM-PAC PT "6 Clicks" Mobility   Outcome Measure  Help needed turning from your back to your side while in a flat bed without using bedrails?: A Little Help needed moving from lying on your back to sitting on the side of a flat bed without using bedrails?: A Little Help needed moving to and from a bed to a chair (including a wheelchair)?: A Little Help needed standing up from a chair using your arms (e.g., wheelchair or bedside chair)?: A Little Help needed to walk in hospital room?: A Little Help needed climbing 3-5 steps with a railing? : A Lot 6 Click Score: 17    End of Session   Activity Tolerance: Patient tolerated treatment well Patient left: Other (comment) (left on toilet with instructions to pull cord when finished) Nurse Communication: Mobility status PT Visit Diagnosis: Unsteadiness on feet (R26.81);Muscle weakness (generalized) (M62.81);History of falling (Z91.81) Pain - Right/Left: Right Pain - part of body: Hip (low back)     Time: [id number] PT Time Calculation (min) (ACUTE ONLY): 48 min  Charges:  $Therapeutic Activity: 8-22 mins $Wheel Chair Management: 23-37 mins                     Lexie Koehl B. Migdalia Dk PT, DPT Acute Rehabilitation Services Pager [phone] Office [phone]    [place] 12/19/2020, 4:20 PM

## 2020-12-19 NOTE — TOC Progression Note (Signed)
Transition of Care [facility]) - Progression Note    Patient Details  Name: Heather [name] MRN: [id number] Date of Birth: 30-Sep-1975  Transition [facility] CM/SW Contact  Curlene Labrum, RN Phone Number: 12/19/2020, 8:17 AM  Clinical Narrative:    Case management called and spoke with [name], admissions DON with [facility] and the facilities were unable to offer a bed to the patient since patient was waiting on pending disability approval at this point.  I called and left a message with [name], MSW with [name] and Hydia with [facility] to ask about possible bed offers for patient.  Patient is unsafe discharge at this point to area shelter due to BMI and need for supervision for ambulation and assistance with ADL's.  The patient continues to progress with PT/OT through therapy and will continue to search for safe discharge plan for patient through DTP Team.   Expected Discharge Plan: [place] Barriers to Discharge: [place] Rosalie Gums), Homeless with medical needs (Homeless)  Expected Discharge Plan and Services Expected Discharge Plan: [place] In-house Referral: Clinical Social Work   Post Acute Care Choice: [place] (Needs LTC bed) Living arrangements for the past 2 months: Homeless Expected Discharge Date: 11/02/20                                     Social Determinants of Health (SDOH) Interventions    Readmission Risk Interventions Readmission Risk Prevention Plan 12/08/2020  Transportation Screening Complete  Medication Review Press photographer) Complete  PCP or Specialist appointment within 3-5 days of discharge Complete  HRI or [place] Complete  SW Recovery Care/Counseling Consult Complete  [place] Not Applicable  Some recent data might be hidden

## 2020-12-19 NOTE — Plan of Care (Signed)
  Problem: Health Behavior/Discharge Planning: Goal: Ability to manage health-related needs will improve Outcome: Progressing   Problem: Activity: Goal: Risk for activity intolerance will decrease Outcome: Progressing   

## 2020-12-19 NOTE — Progress Notes (Signed)
Occupational Therapy Treatment Patient Details Name: Heather [name] MRN: [id number] DOB: 1975-07-23 Today's Date: 12/19/2020   History of present illness 45 y.o. female presented to ED for chest pain and palpitations and feeling like her throat was closing while at her behavioral health facility for SI. Admitted 10/31/20 for treatment of complicated UTI PMH: anxiety and depression (hx of bipolar and schizoaffective disorder in chart in past), ACD, panniculitis, debility,GERD,  OSA not on cpap, chronic back pain.   OT comments  STAR program session:  Patient donned socks seated on eob with sock aide and performed mobility and toilet transfers with rw and supervision.  Self care performed seated and standing from recliner with AE and patient donning T-shirt and pants instead of gown.  Patient progressing with OT treatment.   Recommendations for follow up therapy are one component of a multi-disciplinary discharge planning process, led by the attending physician.  Recommendations may be updated based on patient status, additional functional criteria and insurance authorization.    Follow Up Recommendations  Other (comment) (Care team working on placement)    Equipment Recommendations  3 in 1 bedside commode;Tub/shower bench;Other (comment)    Recommendations for Other Services Other (comment) (STAR program until other placement can be provided)    Precautions / Restrictions Precautions Precautions: Fall Precaution Comments: fell prior to hosp       Mobility Bed Mobility Overal bed mobility: Needs Assistance Bed Mobility: Supine to Sit     Supine to sit: Supervision     General bed mobility comments: uses rails    Transfers Overall transfer level: Needs assistance Equipment used: Rolling walker (2 wheeled) Transfers: Sit to/from Stand Sit to Stand: Supervision Stand pivot transfers: Supervision       General transfer comment: used rw to transfer to toilet and back to  recliner    Balance Overall balance assessment: Needs assistance Sitting-balance support: No upper extremity supported;Feet supported Sitting balance-Leahy Scale: Fair Sitting balance - Comments: Sitting on EOB without UE support   Standing balance support: No upper extremity supported Standing balance-Leahy Scale: Fair Standing balance comment: able to stand to pull up clothing                           ADL either performed or assessed with clinical judgement   ADL Overall ADL's : Needs assistance/impaired Eating/Feeding: Independent;Bed level   Grooming: Brushing hair;Sitting Grooming Details (indicate cue type and reason): setup only Upper Body Bathing: Set up;Sitting Upper Body Bathing Details (indicate cue type and reason): sink bath with setup Lower Body Bathing: Supervison/ safety Lower Body Bathing Details (indicate cue type and reason): bathed seated in chair Upper Body Dressing : Set up Upper Body Dressing Details (indicate cue type and reason): Donned T-shirt Lower Body Dressing: Minimal assistance Lower Body Dressing Details (indicate cue type and reason): donned socks with sock aid and donned pants with assistance to get over her hips while standing Toilet Transfer: Supervision/safety;RW Toilet Transfer Details (indicate cue type and reason): BSC over toilet in bathroom Toileting- Clothing Manipulation and Hygiene: Set up;Sitting/lateral lean Toileting - Clothing Manipulation Details (indicate cue type and reason): perineal cleaning     Functional mobility during ADLs: Rolling walker;Supervision/safety General ADL Comments: sink bath with bathing and dressing performed seated and standing from recliner     Vision       Perception     Praxis      Cognition Arousal/Alertness: Awake/alert Behavior During Therapy: [facility] for  tasks assessed/performed Overall Cognitive Status: Within Functional Limits for tasks assessed Area of Impairment: Awareness                    Current Attention Level: Selective     Safety/Judgement: Decreased awareness of safety Awareness: Intellectual Problem Solving: Requires verbal cues General Comments: needs encouragement to stay out of bed        Exercises     Shoulder Instructions       General Comments      Pertinent Vitals/ Pain       Pain Assessment: No/denies pain  Home Living                                          Prior Functioning/Environment              Frequency  Min 5X/week        Progress Toward Goals  OT Goals(current goals can now be found in the care plan section)  Progress towards OT goals: Progressing toward goals  Acute Rehab OT Goals Patient Stated Goal: to reduce pain OT Goal Formulation: With patient Time For Goal Achievement: 12/22/20 Potential to Achieve Goals: Fair ADL Goals Pt Will Perform Grooming: with modified independence;standing Pt Will Perform Lower Body Bathing: with modified independence;sit to/from stand;sitting/lateral leans;with adaptive equipment Pt Will Perform Lower Body Dressing: with modified independence;with adaptive equipment;sitting/lateral leans;sit to/from stand Pt Will Transfer to Toilet: with modified independence;bedside commode;ambulating Pt Will Perform Toileting - Clothing Manipulation and hygiene: with modified independence;with adaptive equipment;sitting/lateral leans;sit to/from stand Pt Will Perform Tub/Shower Transfer: with modified independence;rolling walker  Plan Discharge plan remains appropriate    Co-evaluation                 AM-PAC OT "6 Clicks" Daily Activity     Outcome Measure   Help from another person eating meals?: None Help from another person taking care of personal grooming?: A Little Help from another person toileting, which includes using toliet, bedpan, or urinal?: A Little Help from another person bathing (including washing, rinsing, drying)?: A  Little Help from another person to put on and taking off regular upper body clothing?: A Little Help from another person to put on and taking off regular lower body clothing?: A Little 6 Click Score: 19    End of Session Equipment Utilized During Treatment: Rolling walker  OT Visit Diagnosis: Unsteadiness on feet (R26.81);Other abnormalities of gait and mobility (R26.89);Muscle weakness (generalized) (M62.81);Pain;Other (comment) Pain - Right/Left: Right Pain - part of body: Hip;Knee   Activity Tolerance Patient limited by fatigue   Patient Left in chair;with call bell/phone within reach;with family/visitor present   Nurse Communication Mobility status        Time: [id number] OT Time Calculation (min): 45 min  Charges: OT General Charges $OT Visit: 1 Visit OT Treatments $Self Care/Home Management : 38-52 mins  Lodema Hong, OTA   Heather [name] 12/19/2020, 8:59 AM

## 2020-12-19 NOTE — Progress Notes (Signed)
[facility] PROGRESS NOTE  Heather [name] [id number] DOB: 15-May-1975 DOA: 10/30/2020 PCP: Lucianne Lei, MD  Status: Remains inpatient appropriate because:Unsafe d/c plan  Dispo:  Patient From:  Homeless-newly homeless since May 2022  Planned Disposition: ALF??  Medically stable for discharge: Yes  Barriers to discharge: Morbid obesity/disability pending    Level of care: Med-Surg  Code Status: Full Family Communication: Patient only DVT prophylaxis: Lovenox COVID vaccination status: Moderna x1 on 11/06/2020  and Pfizer x1 on 08/28/2020    HPI: 45 y.o. female with PMH significant for morbid obesity, DM2, obstructive sleep apnea, COPD, MDD, GAD, insomnia and RLS,renal mass,  and numerous small wounds under her pannus, upper thighs and between her legs.   Patient presented to the ED on 7/25 presented with right flank and suprapubic pain along with nausea and vomiting.   She was admitted and treated for complicated UTI with antibiotics and has completed treatment.  Currently medically stable for discharge to SNF.  Subjective: Sitting up in chair.  Reports fatigue after excellent therapy sessions yesterday.  Continues to have issues with knee pain with mobility   Objective: Vitals:   12/19/20 0501 12/19/20 0726  BP: (!) 97/59   Pulse: 69   Resp: 20   Temp: 98.2 F (36.8 C)   SpO2: 99% 99%    Intake/Output Summary (Last 24 hours) at 12/19/2020 0750 Last data filed at 12/19/2020 0200 Gross per 24 hour  Intake 243 ml  Output 0 ml  Net 243 ml   Filed Weights   11/26/20 0500 11/27/20 0546 12/03/20 0600  Weight: (!) 196 kg (!) 196 kg (!) 194 kg    Exam:  Constitutional: NAD, calm Respiratory: Anterior lung sounds clear, room air, normal pulse oximetry Cardiovascular: S1-S2, normotensive, regular pulse Abdomen: Soft and nontender.  Obese.  Bowel sounds positive. LBM 9/8, eating well Skin: Several areas consistent with cystic acne on upper back below neck as  well as left shoulder-proved as compared to 9/9 Neurologic: CN 2-12 grossly intact. Sensation intact, Strength 4/5 x all 4 extremities.  Psychiatric: Normal judgment and insight. Alert and oriented x 3.    Assessment/Plan: Acute problems: Complicated UTI presumed 2/2 Enterobacter colonization -Patient completed 3 days of cefepime, urine culture colonized,   Nonobstructing nephrolithiasis Renal cyst -CT abdomen and pelvis from 7/26 showed bilateral nonobstructing stones including a staghorn calculus on the left.  Can follow-up with urology as an outpatient. -CT scan abdomen from 7/19 also showed up 8.3 cm right upper pole renal cyst likely benign.   Suicidal ideation/anxiety and depression -Psychiatry has cleared the patient for discharge.   -Continue Wellbutrin, trazodone, and prn clonazepam -Outpatient follow-up with psychiatry recommended  Suspected OSA/OHS -Patient with O2 saturations between 90 and 94% while sleeping -Recommend outpatient sleep study  Right hip/back/leg pain and patient with known disc herniation on imaging -Discussed with IR regarding possible image guided injection of SI joint: 2 radiologists reviewed this patient, one is IR and one is a musculoskeletal rad, they both said that because of this patient's habitus/lack of equipment here in the hospital we won't be able to do an SI injection. You can place an order for this to be attempted at our outpatient spine center ([address]) but it would need to be reviewed by the radiologists there before it would be scheduled.  -Given size of leg and knee as well as current skin breakdown she is not a candidate for splint or other immobilizing device to right knee. -Back pain (  chronic).  Continue outpatient pain medications (Topamax, meloxicam and Robaxin).  -Continue Lyrica and Oxy IR  Asthma -Patient complaining of nocturnal wheezing and difficulty breathing which is not improved with use of albuterol rescue inhaler  and is requesting nebulizer -Continue DuoNebs as needed -We will start patient on long-acting agents: Dulera as well as Umeclidinium (was not on prior to admission) -Continue Singulair  CKD 3B -Used to be on indapamide in the past.   --Creatinine stable with GFR less than 60 and with resumption of meloxicam creatinine stable at 1.38 with previous reading 1.25   Generalized deconditioning Homelessness -PT/OT initially recommended SNF.  History recent therapy sessions demonstrate increased independence with mobility when utilizing rolling walker. -Primary barriers to ambulation are patient's motivation and ongoing back pain which is being treated as above as well as her known thoracic and lumbar disc herniation causing chronic pain which is compounded by her morbid obesity  Thoracic and lumbar spine disc herniation -Has difficulty with mobility and chronic pain secondary to disc herniation which is significantly compounded by patient's profound morbid obesity -Because of chronic pain from this condition patient has not been able to obtain appropriate employment as the jobs she is qualified for require her to walk, stand or lift which she is unable to do  Cystic acne -Location on back and shoulder -Improved after initiation of oral antibiotics over the weekend   Yeast panniculitis -Significantly improved. -Continue nystatin powder to all of intertriginous areas (TID) -Instructions given for staff to wash all intertriginous areas with water and pat dry before applying a light dusting of the nystatin powder   Morbid obesity  -Body mass index is 76.53 kg/m. Patient has been advised to make an attempt to improve diet and exercise patterns to aid in weight loss.   Generalized anxiety disorder/restless legs -Continue Wellbutrin, BuSpar, Neurontin, Requip.      Data Reviewed: Basic Metabolic Panel: Recent Labs  Lab 12/16/20 0315 12/19/20 0501  NA 137  --   K 3.9  --   CL 108  --    CO2 21*  --   GLUCOSE 85  --   BUN 20  --   CREATININE 1.21* 1.30*  CALCIUM 8.9  --     Scheduled Meds:  ascorbic acid  500 mg Oral Daily   bacitracin   Topical BID   buPROPion ER  200 mg Oral BID   clotrimazole   Topical BID   diclofenac Sodium  4 g Topical QID   doxycycline  100 mg Oral Q12H   enoxaparin (LOVENOX) injection  100 mg Subcutaneous Q24H   fluticasone  2 spray Each Nare BID   methocarbamol  500 mg Oral QID   mometasone-formoterol  2 puff Inhalation BID   montelukast  10 mg Oral QHS   nystatin   Topical TID   pantoprazole  40 mg Oral Daily   pregabalin  100 mg Oral TID   rOPINIRole  4 mg Oral QHS   senna-docusate  2 tablet Oral Daily   sodium chloride flush  3 mL Intravenous Q12H   topiramate  25 mg Oral BID   traZODone  50 mg Oral QHS   umeclidinium bromide  1 puff Inhalation Daily   Continuous Infusions:  sodium chloride      Principal Problem:   Complicated UTI (urinary tract infection) Active Problems:   Asthma   PALPITATIONS, OCCASIONAL   Panniculitis   OSA (obstructive sleep apnea)   Major depressive disorder, recurrent episode, moderate (HCC)  Generalized anxiety disorder   Major depression, chronic   Thrombocytosis   Hypokalemia   CKD (chronic kidney disease), stage III [facility])   Consultants: Psychiatry  Procedures: None  Antibiotics: Fosfomycin x1 on 7/19, Macrodantin 7/21 and 7/22 Ertapenem x1 on 7/26 Cefepime 11/02/2027   Time spent: 25 minutes    Erin Hearing ANP  [facility] 7 am - 330 pm/M-F for direct patient care and secure chat Please refer to Amion for contact info 48  days

## 2020-12-20 NOTE — TOC Progression Note (Signed)
Transition of Care [facility]) - Progression Note    Patient Details  Name: Maciel Langlois Kita MRN: [id number] Date of Birth: Nov 19, 1975  [facility] CM/SW [place], RN Phone Number: 12/20/2020, 12:11 PM  Clinical Narrative:    CM spoke with Hydia, CM at [facility] and she was agreeable to having the corporate director review the patient for possible admission to one of their facilities.  Clinicals were sent by secure email to the facility at [email or web address]  CM and MSW will continue to follow the patient for discharge needs.   Expected Discharge Plan: Skilled Nursing Facility Barriers to Discharge: [place] ([place]), Homeless with medical needs (Homeless)  Expected Discharge Plan and Services Expected Discharge Plan: [place] In-house Referral: Clinical Social Work   Post Acute Care Choice: [place] (Needs LTC bed) Living arrangements for the past 2 months: Homeless Expected Discharge Date: 11/02/20                                     Social Determinants of Health (SDOH) Interventions    Readmission Risk Interventions Readmission Risk Prevention Plan 12/08/2020  Transportation Screening Complete  Medication Review Press photographer) Complete  PCP or Specialist appointment within 3-5 days of discharge Complete  HRI or [place] Complete  SW Recovery Care/Counseling Consult Complete  [place] Not Applicable  Some recent data might be hidden

## 2020-12-20 NOTE — Progress Notes (Signed)
Occupational Therapy Treatment Patient Details Name: Heather [name] MRN: [id number] DOB: 08-Jan-1976 Today's Date: 12/20/2020   History of present illness 45 y.o. female presented to ED for chest pain and palpitations and feeling like her throat was closing while at her behavioral health facility for SI. Admitted 10/31/20 for treatment of complicated UTI PMH: anxiety and depression (hx of bipolar and schizoaffective disorder in chart in past), ACD, panniculitis, debility,GERD,  OSA not on cpap, chronic back pain.   OT comments  STAR program session:  Patient seen by skilled OT to increase independence and safety with ADLs.  Patient required fewer cues to initiate getting out of bed and ambulated to bathroom for shower.  Patient required min assist for sit to stand from 3n1 in shower and can normally do with supervision.  Patient wore hospital gowns on this date due to lack of personnel clothing.  Patient to continue with STAR program and is making good progress.   Recommendations for follow up therapy are one component of a multi-disciplinary discharge planning process, led by the attending physician.  Recommendations may be updated based on patient status, additional functional criteria and insurance authorization.    Follow Up Recommendations  Other (comment)    Equipment Recommendations  3 in 1 bedside commode;Tub/shower bench;Other (comment)    Recommendations for Other Services      Precautions / Restrictions Precautions Precautions: Fall Precaution Comments: fell prior to hosp       Mobility Bed Mobility Overal bed mobility: Needs Assistance Bed Mobility: Supine to Sit     Supine to sit: Supervision     General bed mobility comments: HOB elevated and uses rails    Transfers Overall transfer level: Needs assistance Equipment used: Rolling walker (2 wheeled) Transfers: Sit to/from Stand Sit to Stand: Min assist Stand pivot transfers: Supervision       General  transfer comment: required min assist to stand from 3n1 in toilet    Balance Overall balance assessment: Needs assistance Sitting-balance support: No upper extremity supported;Feet supported Sitting balance-Leahy Scale: Fair Sitting balance - Comments: Sitting on EOB without UE support   Standing balance support: No upper extremity supported;During functional activity Standing balance-Leahy Scale: Fair Standing balance comment: ABle to dry self while standing without support,requires UE support for mobility                           ADL either performed or assessed with clinical judgement   ADL Overall ADL's : Needs assistance/impaired     Grooming: Brushing hair;Sitting Grooming Details (indicate cue type and reason): setup only Upper Body Bathing: Set up;Sitting Upper Body Bathing Details (indicate cue type and reason): UB bathing in shower with LH sponge for back Lower Body Bathing: Supervison/ safety Lower Body Bathing Details (indicate cue type and reason): LB bathing in shower with LH sponge Upper Body Dressing : Minimal assistance Upper Body Dressing Details (indicate cue type and reason): to donn gown Lower Body Dressing: Supervision/safety Lower Body Dressing Details (indicate cue type and reason): with sock aide for footwear         Tub/ Shower Transfer: Walk-in shower;Min guard;Cueing for safety;3 in 1 Tub/Shower Transfer Details (indicate cue type and reason): min guard for walk in shower with small ledge to step over Functional mobility during ADLs: Rolling walker;Supervision/safety General ADL Comments: Requiring less assistance for bathing     Vision       Perception     Praxis  Cognition Arousal/Alertness: Awake/alert Behavior During Therapy: WFL for tasks assessed/performed Overall Cognitive Status: Within Functional Limits for tasks assessed Area of Impairment: Awareness                   Current Attention Level: Selective      Safety/Judgement: Decreased awareness of safety Awareness: Intellectual Problem Solving: Requires verbal cues General Comments: requires fewer cues for intiation        Exercises     Shoulder Instructions       General Comments      Pertinent Vitals/ Pain       Pain Assessment: Faces Faces Pain Scale: Hurts a little bit Pain Location: low back and R hip Pain Descriptors / Indicators: Grimacing;Aching Pain Intervention(s): Ice applied  Home Living                                          Prior Functioning/Environment              Frequency  Min 5X/week        Progress Toward Goals  OT Goals(current goals can now be found in the care plan section)  Progress towards OT goals: Progressing toward goals  Acute Rehab OT Goals Patient Stated Goal: to reduce pain OT Goal Formulation: With patient Time For Goal Achievement: 12/22/20 Potential to Achieve Goals: Fair ADL Goals Pt Will Perform Grooming: with modified independence;standing Pt Will Perform Lower Body Bathing: with modified independence;sit to/from stand;sitting/lateral leans;with adaptive equipment Pt Will Perform Lower Body Dressing: with modified independence;with adaptive equipment;sitting/lateral leans;sit to/from stand Pt Will Transfer to Toilet: with modified independence;bedside commode;ambulating Pt Will Perform Toileting - Clothing Manipulation and hygiene: with modified independence;with adaptive equipment;sitting/lateral leans;sit to/from stand Pt Will Perform Tub/Shower Transfer: with modified independence;rolling walker  Plan Discharge plan remains appropriate    Co-evaluation                 AM-PAC OT "6 Clicks" Daily Activity     Outcome Measure   Help from another person eating meals?: None Help from another person taking care of personal grooming?: A Little Help from another person toileting, which includes using toliet, bedpan, or urinal?: A  Little Help from another person bathing (including washing, rinsing, drying)?: A Little Help from another person to put on and taking off regular upper body clothing?: A Little Help from another person to put on and taking off regular lower body clothing?: A Little 6 Click Score: 19    End of Session Equipment Utilized During Treatment: Rolling walker  OT Visit Diagnosis: Unsteadiness on feet (R26.81);Other abnormalities of gait and mobility (R26.89);Muscle weakness (generalized) (M62.81);Pain;Other (comment) Pain - Right/Left: Right Pain - part of body: Hip;Knee   Activity Tolerance Patient tolerated treatment well   Patient Left in chair;with call bell/phone within reach   Nurse Communication Mobility status        Time: [id number] OT Time Calculation (min): 47 min  Charges: OT General Charges $OT Visit: 1 Visit OT Treatments $Self Care/Home Management : 38-52 mins  Lodema Hong, OTA   Birdell Frasier Alexis Goodell 12/20/2020, 8:53 AM

## 2020-12-20 NOTE — Progress Notes (Signed)
PROGRESS NOTE    Heather [name]  [id number] DOB: 1976/01/04 DOA: 10/30/2020 PCP: Lucianne Lei, MD   Chief Complaint  Patient presents with   BH/CP  Brief Narrative: 45 year old female with multiple comorbidities including morbid obesity , T2DM, OSA/COPD, MDD, GAD, insomnia, RLS, renal mass, numerous small wounds under her pannus upper thighs and between her legs presented to the ED 7/25 with right flank and suprapubic pain, nausea vomiting and was admitted for complicated UTI. Treated with antibiotics seen by PT OT has been stable for discharge awaiting on placement to skilled nursing facility. But due to patient's homelessness status  insurance issues, disposition has been difficulty.  Subjective: Seen this morning.  On the bedside chair having her meal.  No complaints.  She feels soreness on her back is improving, C/O lower back pain  Assessment & Plan:  Complicated UTI Enterobacter clonization Nonobstructing nephrolithiasis-started on calculus on the left Renal cyst 8.3 cm right upper pole At this time asymptomatic.  Patient will need urology follow-up.  Afebrile..   Suicidal ideation-None currently Generalized anxiety/depression Insomnia Seen by psychiatry cleared for discharge Her mood is stable on current meds-Wellbutrin, trazodone, Topamax, Neurontin, prn clonazepam .   Suspected OSA/OHS-supplemental oxygen as needed.  Outpatient sleep apnea evaluation.    Cystic acne on the upper back and left shoulder area on doxycycline for total 7 days, continue to monitor  Right hip/back/leg pain Known disc herniation on imaging-thoracic and lumbar spine Continue pain control Lyrica, meloxicam antispasm meds cont PTOT.  Because of chronic pain from this condition patient has not been able to obtain appropriate employment.   RLS continue Requip   CKD stage IIIb-off Lasix monitor closely on NSAID.  Leg swelling, b/l mild-appears lymphedematous with obesity-s/p PO Lasix x1  9/10.  As needed Lasix.  Monitor weight  Filed Weights   11/26/20 0500 11/27/20 0546 12/03/20 0600  Weight: (!) 196 kg (!) 196 kg (!) 194 kg     Morbid obesity with BMI  76: Will benefit with outpatient follow-up weight loss lifestyle   Yeast panniculitis-improved.  Continue nystatin   Generalized deconditioning/weakness/homelessness/difficulty with disposition: Pending SNF placement.  Status post COVID-vaccine Moderna x1 8/1 and [place] x1 5/23  Difficult placement TOC following: Multiple places unable to offer bed due to her overweight  Diet Order             Diet regular Room service appropriate? Yes; Fluid consistency: Thin  Diet effective now           Diet general                  Patient's Body mass index is 75.76 kg/m. DVT prophylaxis:   Lovenox, sq, encourage ambulation Code Status:   Code Status: Full Code  Family Communication: plan of care discussed with patient at bedside. Status is: Inpatient Remains inpatient appropriate because:Unsafe d/c plan Dispo:  Patient From: homeless   Planned Disposition: [place]  Medically stable for discharge Difficult to place. Yes  Unresulted Labs (From admission, onward)     Start     Ordered   11/07/20 0500  Creatinine, serum  (enoxaparin (LOVENOX)    CrCl >/= 30 ml/min)  Weekly,   R     Comments: while on enoxaparin therapy    10/31/20 1548           Medications reviewed:  Scheduled Meds:  ascorbic acid  500 mg Oral Daily   bacitracin   Topical BID   buPROPion ER  200 mg Oral BID   clotrimazole   Topical BID   diclofenac Sodium  4 g Topical QID   doxycycline  100 mg Oral Q12H   enoxaparin (LOVENOX) injection  100 mg Subcutaneous Q24H   fluticasone  2 spray Each Nare BID   methocarbamol  500 mg Oral QID   mometasone-formoterol  2 puff Inhalation BID   montelukast  10 mg Oral QHS   nystatin   Topical TID   pantoprazole  40 mg Oral Daily   pregabalin  100 mg Oral TID   rOPINIRole  4 mg  Oral QHS   senna-docusate  2 tablet Oral Daily   sodium chloride flush  3 mL Intravenous Q12H   topiramate  25 mg Oral BID   traZODone  50 mg Oral QHS   umeclidinium bromide  1 puff Inhalation Daily   Continuous Infusions:  sodium chloride     Consultants:see note  Procedures:see note Antimicrobials: Anti-infectives (From admission, onward)    Start     Dose/Rate Route Frequency Ordered Stop   12/17/20 1030  doxycycline (VIBRA-TABS) tablet 100 mg        100 mg over 7 Days Oral Every 12 hours 12/17/20 0942     11/01/20 1000  ceFEPIme (MAXIPIME) 2 g in sodium chloride 0.9 % 100 mL IVPB        2 g 200 mL/hr over 30 Minutes Intravenous Every 12 hours 10/31/20 1550 11/03/20 2309   11/01/20 0600  ceFEPIme (MAXIPIME) 2 g in sodium chloride 0.9 % 100 mL IVPB  Status:  Discontinued        2 g 200 mL/hr over 30 Minutes Intravenous Every 8 hours 10/31/20 1541 10/31/20 1550   10/31/20 1000  ertapenem (INVANZ) 1,000 mg in sodium chloride 0.9 % 100 mL IVPB  Status:  Discontinued        1 g 200 mL/hr over 30 Minutes Intravenous Every 24 hours 10/31/20 0948 10/31/20 1541      Culture/Microbiology    Component Value Date/Time   SDES  10/26/2020 1800    URINE, CLEAN CATCH Performed at [facility], [place] [address], [place], [place] [address]    SPECREQUEST  10/26/2020 1800    NONE Performed at [facility], [place] [address], [place], [place] [address]    CULT MULTIPLE SPECIES PRESENT, SUGGEST RECOLLECTION (A) 10/26/2020 1800   REPTSTATUS 10/28/2020 FINAL 10/26/2020 1800    Other culture-see note  Objective: Vitals: Today's Vitals   12/20/20 0800 12/20/20 0908 12/20/20 1141 12/20/20 1225  BP:  120/64    Pulse:  90    Resp:  18    Temp:  98.7 F (37.1 C)    TempSrc:      SpO2:  100%    Weight:      Height:      PainSc: 0-No pain  9  5     Intake/Output Summary (Last 24 hours) at 12/20/2020 1320 Last data filed at 12/20/2020 0900 Gross per  24 hour  Intake 780 ml  Output 0 ml  Net 780 ml    Filed Weights   11/26/20 0500 11/27/20 0546 12/03/20 0600  Weight: (!) 196 kg (!) 196 kg (!) 194 kg   Weight change:   Intake/Output from previous day: 09/13 0701 - 09/14 0700 In: 1140 [P.O.:1140] Out: -  Intake/Output this shift: Total I/O In: 240 [P.O.:240] Out: 0  Filed Weights   11/26/20 0500 11/27/20 0546 12/03/20 0600  Weight: (!) 196  kg (!) 196 kg (!) 194 kg   Examination: General exam: AAOx e, obese, pleasant older than stated age, weak appearing. HEENT:Oral mucosa moist, Ear/Nose WNL grossly, dentition normal. Respiratory system: bilaterally diminished,no use of accessory muscle Cardiovascular system: S1 & S2 +, No JVD,. Gastrointestinal system: Abdomen soft, obese limiting of exam, NT,ND, BS+ Nervous System:Alert, awake, moving extremities and grossly nonfocal Extremities: Chronic lymphedematous/swollen leg Skin: No rashes,no icterus.  Areas of cystic acne w/ mild erythema on the upper back/lt shoulder. MSK: Normal muscle bulk,tone, power   Data Reviewed: I have personally reviewed following labs and imaging studies CBC: No results for input(s): WBC, NEUTROABS, HGB, HCT, MCV, PLT in the last 168 hours. Basic Metabolic Panel: Recent Labs  Lab 12/16/20 0315 12/19/20 0501  NA 137  --   K 3.9  --   CL 108  --   CO2 21*  --   GLUCOSE 85  --   BUN 20  --   CREATININE 1.21* 1.30*  CALCIUM 8.9  --     GFR: Estimated Creatinine Clearance: 97 mL/min (A) (by C-G formula based on SCr of 1.3 mg/dL (H)). Liver Function Tests: No results for input(s): AST, ALT, ALKPHOS, BILITOT, PROT, ALBUMIN in the last 168 hours. No results for input(s): LIPASE, AMYLASE in the last 168 hours. No results for input(s): AMMONIA in the last 168 hours. Coagulation Profile: No results for input(s): INR, PROTIME in the last 168 hours. Cardiac Enzymes: No results for input(s): CKTOTAL, CKMB, CKMBINDEX, TROPONINI in the last 168  hours. BNP (last 3 results) No results for input(s): PROBNP in the last 8760 hours. HbA1C: No results for input(s): HGBA1C in the last 72 hours. CBG: No results for input(s): GLUCAP in the last 168 hours. Lipid Profile: No results for input(s): CHOL, HDL, LDLCALC, TRIG, CHOLHDL, LDLDIRECT in the last 72 hours. Thyroid Function Tests: No results for input(s): TSH, T4TOTAL, FREET4, T3FREE, THYROIDAB in the last 72 hours. Anemia Panel: No results for input(s): VITAMINB12, FOLATE, FERRITIN, TIBC, IRON, RETICCTPCT in the last 72 hours. Sepsis Labs: No results for input(s): PROCALCITON, LATICACIDVEN in the last 168 hours.  No results found for this or any previous visit (from the past 240 hour(s)).   Radiology Studies: No results found.   LOS: 65 days   Antonieta Pert, MD [facility]  12/20/2020, 1:20 PM

## 2020-12-20 NOTE — Progress Notes (Signed)
Physical Therapy Treatment Patient Details Name: Heather [name] MRN: [id number] DOB: 11/13/1975 Today's Date: 12/20/2020   History of Present Illness 45 y.o. female presented to ED for chest pain and palpitations and feeling like her throat was closing while at her behavioral health facility for SI. Admitted 10/31/20 for treatment of complicated UTI PMH: anxiety and depression (hx of bipolar and schizoaffective disorder in chart in past), ACD, panniculitis, debility,GERD,  OSA not on cpap, chronic back pain.    PT Comments    Pt goals have been reassessed and upgraded due to her continued steady progress. Pt continues to be supervision for bed mobility largely because her movement is dominated by momentum. Pt is min guard for transfers from elevated positions and modA from lower surfaces. Pt is currently able to ambulated 100 feet with min guard and RW. Pt is also able to propel a wheelchair with UE and LE 60 feet with min A for navigation.D/c plans remain appropriate at this time. PT will continue to follow acutely.      Recommendations for follow up therapy are one component of a multi-disciplinary discharge planning process, led by the attending physician.  Recommendations may be updated based on patient status, additional functional criteria and insurance authorization.  Follow Up Recommendations  Other (comment);Supervision - Intermittent (ALF)     Equipment Recommendations  None recommended by PT (owns bariatric RW)       Precautions / Restrictions Precautions Precautions: Fall Precaution Comments: fell prior to hosp Restrictions Weight Bearing Restrictions: No     Mobility  Bed Mobility Overal bed mobility: Needs Assistance Bed Mobility: Supine to Sit;Sit to Supine     Supine to sit: Supervision Sit to supine: Supervision   General bed mobility comments: uses HOB elevated, and rails to pull to side of bed    Transfers Overall transfer level: Needs  assistance Equipment used: None Transfers: Sit to/from Stand Sit to Stand: Mod assist;Min guard         General transfer comment: min guard for initial power up from elevated bed, modA required for power up from much lower wheelchair x2, pt with good ability to lift hips, difficulty with reaching up to RW and bringing trunk to upright  Ambulation/Gait Ambulation/Gait assistance: Min guard Gait Distance (Feet): 60 Feet (+100, +30) Assistive device: Rolling walker (2 wheeled) (bariatric RW) Gait Pattern/deviations: Step-through pattern;Trunk flexed Gait velocity: too fast to avoid early onset of dyspnea Gait velocity interpretation: <1.31 ft/sec, indicative of household ambulator General Gait Details: min guard for safety, vc for slowing pace to decreased [employer] operational officer Wheelchair mobility: Yes Wheelchair propulsion: Both upper extremities;Both lower extermities Wheelchair parts: Needs assistance Distance: 59 Wheelchair Assistance Details (indicate cue type and reason): requires min A for navigation when [place] to walls, otherwise maximal verbal cuing for steering         Balance Overall balance assessment: Needs assistance Sitting-balance support: No upper extremity supported;Feet supported Sitting balance-Leahy Scale: Fair     Standing balance support: No upper extremity supported Standing balance-Leahy Scale: Fair Standing balance comment: able to stand with UE support for ~6 minutes while holding conversation with RN                            Cognition Arousal/Alertness: Awake/alert Behavior During Therapy: WFL for tasks assessed/performed Overall Cognitive Status: Within Functional Limits for tasks  assessed Area of Impairment: Awareness                   Current Attention Level: Selective     Safety/Judgement: Decreased awareness of safety   Problem Solving: Requires verbal cues General  Comments: continues to need encouragement but once she starts is making progress         General Comments General comments (skin integrity, edema, etc.): VSS on RA, mother and friend in room during session      Pertinent Vitals/Pain Pain Assessment: Faces Faces Pain Scale: Hurts little more Pain Location: LBP Pain Descriptors / Indicators: Grimacing;Aching;Discomfort Pain Intervention(s): Limited activity within patient's tolerance;Monitored during session;Premedicated before session;Repositioned           PT Goals (current goals can now be found in the care plan section) Acute Rehab PT Goals Patient Stated Goal: to reduce pain PT Goal Formulation: With patient Time For Goal Achievement: 12/12/20 Potential to Achieve Goals: Fair Progress towards PT goals: Progressing toward goals    Frequency    Min 3X/week (try to progress activity tolerance to D/C to homeless shelter)      PT Plan Current plan remains appropriate       AM-PAC PT "6 Clicks" Mobility   Outcome Measure  Help needed turning from your back to your side while in a flat bed without using bedrails?: A Little Help needed moving from lying on your back to sitting on the side of a flat bed without using bedrails?: A Little Help needed moving to and from a bed to a chair (including a wheelchair)?: A Little Help needed standing up from a chair using your arms (e.g., wheelchair or bedside chair)?: A Little Help needed to walk in hospital room?: A Little Help needed climbing 3-5 steps with a railing? : A Lot 6 Click Score: 17    End of Session   Activity Tolerance: Patient tolerated treatment well Patient left: in bed;with call bell/phone within reach;with family/visitor present Nurse Communication: Mobility status PT Visit Diagnosis: Unsteadiness on feet (R26.81);Muscle weakness (generalized) (M62.81);History of falling (Z91.81) Pain - Right/Left: Right Pain - part of body: Hip (low back)     Time:  1441-1530 PT Time Calculation (min) (ACUTE ONLY): 49 min  Charges:  $Gait Training: 8-22 mins $Therapeutic Activity: 8-22 mins $Wheel Chair Management: 8-22 mins                     Trayquan Kolakowski B. Migdalia Dk PT, DPT Acute Rehabilitation Services Pager [phone] Office [phone]    [place] 12/20/2020, 3:59 PM

## 2020-12-20 NOTE — Progress Notes (Signed)
Patient declined at [facility] and [employer].  [name], MSW, LCSW Transitions of Care  Clinical Social Worker II [phone]

## 2020-12-21 LAB — BASIC METABOLIC PANEL
Anion gap: 8 (ref 5–15)
BUN: 26 mg/dL — ABNORMAL HIGH (ref 6–20)
CO2: 21 mmol/L — ABNORMAL LOW (ref 22–32)
Calcium: 9.1 mg/dL (ref 8.9–10.3)
Chloride: 109 mmol/L (ref 98–111)
Creatinine, Ser: 1.38 mg/dL — ABNORMAL HIGH (ref 0.44–1.00)
GFR, Estimated: 49 mL/min — ABNORMAL LOW (ref >60)
Glucose, Bld: 88 mg/dL (ref 70–99)
Potassium: 3.9 mmol/L (ref 3.5–5.1)
Sodium: 138 mmol/L (ref 135–145)

## 2020-12-21 LAB — CBC
HCT: 31.9 % — ABNORMAL LOW (ref 36.0–46.0)
Hemoglobin: 9.4 g/dL — ABNORMAL LOW (ref 12.0–15.0)
MCH: 25.1 pg — ABNORMAL LOW (ref 26.0–34.0)
MCHC: 29.5 g/dL — ABNORMAL LOW (ref 30.0–36.0)
MCV: 85.3 fL (ref 80.0–100.0)
Platelets: 342 10*3/uL (ref 150–400)
RBC: 3.74 MIL/uL — ABNORMAL LOW (ref 3.87–5.11)
RDW: 17.3 % — ABNORMAL HIGH (ref 11.5–15.5)
WBC: 10.7 10*3/uL — ABNORMAL HIGH (ref 4.0–10.5)
nRBC: 0 % (ref 0.0–0.2)

## 2020-12-21 NOTE — TOC Progression Note (Signed)
Transition of Care [facility]) - Progression Note    Patient Details  Name: Heather [name] MRN: [id number] Date of Birth: 03-Apr-1976  Transition of Care [facility]) CM/SW [place], RN Phone Number: 12/21/2020, 1:40 PM  Clinical Narrative:    Case management called and spoke with Hydia, CM with [facility] and the facility will review the patient's clinicals for possible bed offer at the facility if appropriate.  Clinicals were emailed to Hydia at [employer] for review at [email or web address] for review this morning at 0900.  CM called and spoke with [employer], DSS SW and she states that DSS has sent the patient's FL2 to a LTC caseworker to follow for probable approval for LTC Medicaid.  Earnest Bailey is aware that [facility] is reviewing the patient for possible bed offer at this time and that DTP will follow up and notify DSS if able to find an appropriate bed offer and placement for the patient.  Patient is continuing to work with PT/OT through the [facility] program for mobility issues and safety for appropriate safe discharge plan.   Expected Discharge Plan: Skilled Nursing Facility Barriers to Discharge: [place] ([place]), Homeless with medical needs (Homeless)  Expected Discharge Plan and Services Expected Discharge Plan: [place] In-house Referral: Clinical Social Work   Post Acute Care Choice: [place] (Needs LTC bed) Living arrangements for the past 2 months: Homeless Expected Discharge Date: 11/02/20                                     Social Determinants of Health (SDOH) Interventions    Readmission Risk Interventions Readmission Risk Prevention Plan 12/08/2020  Transportation Screening Complete  Medication Review 22/05/2020) Complete  PCP or Specialist appointment within 3-5 days of discharge Complete  HRI or [place] Complete  SW Recovery Care/Counseling Consult  Complete  [place] Not Applicable  Some recent data might be hidden

## 2020-12-21 NOTE — Progress Notes (Signed)
PROGRESS NOTE    Heather [name]  [id number] DOB: 08-13-1975 DOA: 10/30/2020 PCP: Lucianne Lei, MD   Chief Complaint  Patient presents with   BH/CP  Brief Narrative: 45 year old female with multiple comorbidities including morbid obesity , T2DM, OSA/COPD, MDD, GAD, insomnia, RLS, renal mass, numerous small wounds under her pannus upper thighs and between her legs presented to the ED 7/25 with right flank and suprapubic pain, nausea vomiting and was admitted for complicated UTI. Treated with antibiotics seen by PT OT has been stable for discharge awaiting on placement to skilled nursing facility. But due to patient's homelessness status  insurance issues, disposition has been difficulty.  Subjective:  No acute events overnight. Overnight labs are stable Chronic pain  Assessment & Plan:  Complicated UTI Enterobacter clonization Nonobstructing nephrolithiasis- calculus on the left Renal cyst 8.3 cm right upper pole At this time asymptomatic.  Patient will need urology follow-up.  Afebrile..   Suicidal ideation-None currently Generalized anxiety/depression Insomnia Seen by psychiatry cleared for discharge Her mood is stable on current meds-Wellbutrin, trazodone, Topamax, Neurontin, prn clonazepam .   Suspected OSA/OHS-supplemental oxygen as needed.  Outpatient sleep apnea evaluation.    Cystic acne on the upper back and left shoulder area on doxycycline 9/11- cont through 9/18  Right hip/back/leg pain Known disc herniation on imaging-thoracic and lumbar spine Continue pain control Lyrica, meloxicam antispasm meds cont PTOT.  Because of chronic pain from this condition patient has not been able to obtain appropriate employment.   RLS continue Requip   CKD stage IIIb-off Lasix monitor closely on NSAID.  Leg swelling, b/l mild-appears lymphedematous with obesity-s/p PO Lasix x1 9/10.  As needed Lasix.  Monitor weight  Filed Weights   11/26/20 0500 11/27/20 0546  12/03/20 0600  Weight: (!) 196 kg (!) 196 kg (!) 194 kg     Morbid obesity with BMI  76: Will benefit with outpatient follow-up weight loss lifestyle   Yeast panniculitis-improved.  Continue nystatin   Generalized deconditioning/weakness/homelessness/difficulty with disposition: Pending SNF placement.  Status post COVID-vaccine Moderna x1 8/1 and [place] x1 5/23  Difficult placement TOC following: Multiple places unable to offer bed due to her overweight  Diet Order             Diet regular Room service appropriate? Yes; Fluid consistency: Thin  Diet effective now           Diet general                  Patient's Body mass index is 75.76 kg/m. DVT prophylaxis:   Lovenox, sq, encourage ambulation Code Status:   Code Status: Full Code  Family Communication: plan of care discussed with patient at bedside. Status is: Inpatient Remains inpatient appropriate because:Unsafe d/c plan Dispo:  Patient From: homeless   Planned Disposition: [facility]  Medically stable for discharge Difficult to place. Yes  Unresulted Labs (From admission, onward)     Start     Ordered   11/07/20 0500  Creatinine, serum  (enoxaparin (LOVENOX)    CrCl >/= 30 ml/min)  Weekly,   R     Comments: while on enoxaparin therapy    10/31/20 1548           Medications reviewed:  Scheduled Meds:  ascorbic acid  500 mg Oral Daily   bacitracin   Topical BID   buPROPion ER  200 mg Oral BID   clotrimazole   Topical BID   diclofenac Sodium  4 g Topical QID  doxycycline  100 mg Oral Q12H   enoxaparin (LOVENOX) injection  100 mg Subcutaneous Q24H   fluticasone  2 spray Each Nare BID   methocarbamol  500 mg Oral QID   mometasone-formoterol  2 puff Inhalation BID   montelukast  10 mg Oral QHS   nystatin   Topical TID   pantoprazole  40 mg Oral Daily   pregabalin  100 mg Oral TID   rOPINIRole  4 mg Oral QHS   senna-docusate  2 tablet Oral Daily   sodium chloride flush  3 mL  Intravenous Q12H   topiramate  25 mg Oral BID   traZODone  50 mg Oral QHS   umeclidinium bromide  1 puff Inhalation Daily   Continuous Infusions:  sodium chloride     Consultants:see note  Procedures:see note Antimicrobials: Anti-infectives (From admission, onward)    Start     Dose/Rate Route Frequency Ordered Stop   12/17/20 1030  doxycycline (VIBRA-TABS) tablet 100 mg        100 mg over 7 Days Oral Every 12 hours 12/17/20 0942     11/01/20 1000  ceFEPIme (MAXIPIME) 2 g in sodium chloride 0.9 % 100 mL IVPB        2 g 200 mL/hr over 30 Minutes Intravenous Every 12 hours 10/31/20 1550 11/03/20 2309   11/01/20 0600  ceFEPIme (MAXIPIME) 2 g in sodium chloride 0.9 % 100 mL IVPB  Status:  Discontinued        2 g 200 mL/hr over 30 Minutes Intravenous Every 8 hours 10/31/20 1541 10/31/20 1550   10/31/20 1000  ertapenem (INVANZ) 1,000 mg in sodium chloride 0.9 % 100 mL IVPB  Status:  Discontinued        1 g 200 mL/hr over 30 Minutes Intravenous Every 24 hours 10/31/20 0948 10/31/20 1541      Culture/Microbiology    Component Value Date/Time   SDES  10/26/2020 1800    URINE, CLEAN CATCH Performed at [facility], [place] [address] [place], [place] [address]    SPECREQUEST  10/26/2020 1800    NONE Performed at [facility], [place] [address], [place], [place] [address]    CULT MULTIPLE SPECIES PRESENT, SUGGEST RECOLLECTION (A) 10/26/2020 1800   REPTSTATUS 10/28/2020 FINAL 10/26/2020 1800    Other culture-see note  Objective: Vitals: Today's Vitals   12/21/20 0453 12/21/20 0757 12/21/20 1013 12/21/20 1109  BP: (!) 100/55  121/76   Pulse: 70 70 81   Resp: '20 18 16   '$ Temp: 99 F (37.2 C)  98 F (36.7 C)   TempSrc:   Oral   SpO2: 100%  96%   Weight:      Height:      PainSc:    5     Intake/Output Summary (Last 24 hours) at 12/21/2020 1256 Last data filed at 12/21/2020 0600 Gross per 24 hour  Intake 840 ml  Output 0 ml  Net 840 ml     Filed Weights   11/26/20 0500 11/27/20 0546 12/03/20 0600  Weight: (!) 196 kg (!) 196 kg (!) 194 kg   Weight change:   Intake/Output from previous day: 09/14 0701 - 09/15 0700 In: 1080 [P.O.:1080] Out: 0  Intake/Output this shift: No intake/output data recorded. Filed Weights   11/26/20 0500 11/27/20 0546 12/03/20 0600  Weight: (!) 196 kg (!) 196 kg (!) 194 kg   Examination: General exam: AAOx 3, morbidly obese, not in distress, pleasant older than stated age,  weak appearing. HEENT:Oral mucosa moist, Ear/Nose WNL grossly, dentition normal. Respiratory system: bilaterally diminished,no use of accessory muscle Cardiovascular system: S1 & S2 +, No JVD,. Gastrointestinal system: Abdomen soft, morbid obesity NT,ND, BS+ Nervous System:Alert, awake, moving extremities and grossly nonfocal Extremities: Chronic  LE edema/edema.  Skin: No rashes,no icterus. MSK: Normal muscle bulk,tone, power   Data Reviewed: I have personally reviewed following labs and imaging studies CBC: Recent Labs  Lab 12/21/20 0338  WBC 10.7*  HGB 9.4*  HCT 31.9*  MCV 85.3  PLT [id number]   Basic Metabolic Panel: Recent Labs  Lab 12/16/20 0315 12/19/20 0501 12/21/20 0338  NA 137  --  138  K 3.9  --  3.9  CL 108  --  109  CO2 21*  --  21*  GLUCOSE 85  --  88  BUN 20  --  26*  CREATININE 1.21* 1.30* 1.38*  CALCIUM 8.9  --  9.1    GFR: Estimated Creatinine Clearance: 91.4 mL/min (A) (by C-G formula based on SCr of 1.38 mg/dL (H)). Liver Function Tests: No results for input(s): AST, ALT, ALKPHOS, BILITOT, PROT, ALBUMIN in the last 168 hours. No results for input(s): LIPASE, AMYLASE in the last 168 hours. No results for input(s): AMMONIA in the last 168 hours. Coagulation Profile: No results for input(s): INR, PROTIME in the last 168 hours. Cardiac Enzymes: No results for input(s): CKTOTAL, CKMB, CKMBINDEX, TROPONINI in the last 168 hours. BNP (last 3 results) No results for input(s): PROBNP in  the last 8760 hours. HbA1C: No results for input(s): HGBA1C in the last 72 hours. CBG: No results for input(s): GLUCAP in the last 168 hours. Lipid Profile: No results for input(s): CHOL, HDL, LDLCALC, TRIG, CHOLHDL, LDLDIRECT in the last 72 hours. Thyroid Function Tests: No results for input(s): TSH, T4TOTAL, FREET4, T3FREE, THYROIDAB in the last 72 hours. Anemia Panel: No results for input(s): VITAMINB12, FOLATE, FERRITIN, TIBC, IRON, RETICCTPCT in the last 72 hours. Sepsis Labs: No results for input(s): PROCALCITON, LATICACIDVEN in the last 168 hours.  No results found for this or any previous visit (from the past 240 hour(s)).   Radiology Studies: No results found.   LOS: 50 days   Antonieta Pert, MD [facility]  12/21/2020, 12:56 PM

## 2020-12-21 NOTE — Progress Notes (Signed)
Occupational Therapy Treatment Patient Details Name: Heather [name] MRN: [id number] DOB: 07-19-1975 Today's Date: 12/21/2020   History of present illness 45 y.o. female presented to ED for chest pain and palpitations and feeling like her throat was closing while at her behavioral health facility for SI. Admitted 10/31/20 for treatment of complicated UTI PMH: anxiety and depression (hx of bipolar and schizoaffective disorder in chart in past), ACD, panniculitis, debility,GERD,  OSA not on cpap, chronic back pain.   OT comments  STAR program OT session:  Patient seen to address toilet transfers and hygiene, shower transfers, bathing in shower with LH sponge, and dressing with AE.  Patient is supervision for most tasks and min guard for shower transfer due to need to step over ledge.  Patient making good progress, continue with STAR program.   Recommendations for follow up therapy are one component of a multi-disciplinary discharge planning process, led by the attending physician.  Recommendations may be updated based on patient status, additional functional criteria and insurance authorization.    Follow Up Recommendations  Other (comment) (Patient in STAR program to progress to home)    Equipment Recommendations  3 in 1 bedside commode;Tub/shower bench;Other (comment)    Recommendations for Other Services      Precautions / Restrictions Precautions Precautions: Fall Precaution Comments: fell prior to hosp       Mobility Bed Mobility Overal bed mobility: Needs Assistance Bed Mobility: Supine to Sit     Supine to sit: Supervision     General bed mobility comments: uses HOB elevated, and rails to pull to side of bed    Transfers Overall transfer level: Needs assistance Equipment used: Rolling walker (2 wheeled) Transfers: Sit to/from Stand Sit to Stand: Supervision;Min guard Stand pivot transfers: Supervision       General transfer comment: min guard for shower  transfers    Balance Overall balance assessment: Needs assistance Sitting-balance support: No upper extremity supported;Feet supported Sitting balance-Leahy Scale: Fair     Standing balance support: No upper extremity supported Standing balance-Leahy Scale: Fair Standing balance comment: Able to stand without support during drying                           ADL either performed or assessed with clinical judgement   ADL Overall ADL's : Needs assistance/impaired     Grooming: Brushing hair;Sitting Grooming Details (indicate cue type and reason): setup only Upper Body Bathing: Set up;Sitting Upper Body Bathing Details (indicate cue type and reason): UB bathing in shower with LH sponge for back Lower Body Bathing: Supervison/ safety Lower Body Bathing Details (indicate cue type and reason): LB bathing in shower with LH sponge Upper Body Dressing : Set up Upper Body Dressing Details (indicate cue type and reason): Donned overhead gown Lower Body Dressing: Set up;Supervision/safety Lower Body Dressing Details (indicate cue type and reason): with sock aide for footwear Toilet Transfer: Supervision/safety;RW Toilet Transfer Details (indicate cue type and reason): BSC over toilet in bathroom Toileting- Clothing Manipulation and Hygiene: Minimal assistance Toileting - Clothing Manipulation Details (indicate cue type and reason): to clean following BM for thoughness Tub/ Shower Transfer: Walk-in shower;Min guard;Cueing for safety;3 in 1 Tub/Shower Transfer Details (indicate cue type and reason): min guard for walk in shower with small ledge to step over Functional mobility during ADLs: Rolling walker;Supervision/safety General ADL Comments: Continues to progress with self care     Vision       Perception  Praxis      Cognition Arousal/Alertness: Awake/alert Behavior During Therapy: WFL for tasks assessed/performed Overall Cognitive Status: Within Functional Limits  for tasks assessed Area of Impairment: Awareness                   Current Attention Level: Selective     Safety/Judgement: Decreased awareness of safety Awareness: Intellectual Problem Solving: Requires verbal cues General Comments: Participating well        Exercises     Shoulder Instructions       General Comments      Pertinent Vitals/ Pain       Pain Assessment: 0-10 Pain Score: 6  Faces Pain Scale: Hurts little more Pain Location: LBP Pain Descriptors / Indicators: Aching;Grimacing Pain Intervention(s): Monitored during session;Patient requesting pain meds-RN notified  Home Living                                          Prior Functioning/Environment              Frequency  Min 5X/week        Progress Toward Goals  OT Goals(current goals can now be found in the care plan section)  Progress towards OT goals: Progressing toward goals  Acute Rehab OT Goals Patient Stated Goal: to reduce pain OT Goal Formulation: With patient Time For Goal Achievement: 12/22/20 Potential to Achieve Goals: Fair ADL Goals Pt Will Perform Grooming: with modified independence;standing Pt Will Perform Lower Body Bathing: with modified independence;sit to/from stand;sitting/lateral leans;with adaptive equipment Pt Will Perform Lower Body Dressing: with modified independence;with adaptive equipment;sitting/lateral leans;sit to/from stand Pt Will Transfer to Toilet: with modified independence;bedside commode;ambulating Pt Will Perform Toileting - Clothing Manipulation and hygiene: with modified independence;with adaptive equipment;sitting/lateral leans;sit to/from stand Pt Will Perform Tub/Shower Transfer: with modified independence;rolling walker  Plan Discharge plan remains appropriate    Co-evaluation                 AM-PAC OT "6 Clicks" Daily Activity     Outcome Measure   Help from another person eating meals?: None Help from  another person taking care of personal grooming?: A Little Help from another person toileting, which includes using toliet, bedpan, or urinal?: A Little Help from another person bathing (including washing, rinsing, drying)?: A Little Help from another person to put on and taking off regular upper body clothing?: A Little Help from another person to put on and taking off regular lower body clothing?: A Little 6 Click Score: 19    End of Session Equipment Utilized During Treatment: Rolling walker  OT Visit Diagnosis: Unsteadiness on feet (R26.81);Other abnormalities of gait and mobility (R26.89);Muscle weakness (generalized) (M62.81);Pain;Other (comment) Pain - Right/Left: Right Pain - part of body: Hip;Knee   Activity Tolerance Patient tolerated treatment well   Patient Left in chair;with call bell/phone within reach   Nurse Communication Patient requests pain meds;Mobility status        Time: [id number] OT Time Calculation (min): 45 min  Charges: OT General Charges $OT Visit: 1 Visit OT Treatments $Self Care/Home Management : 38-52 mins  Lodema Hong, OTA   Heather [name] 12/21/2020, 9:30 AM

## 2020-12-21 NOTE — Progress Notes (Signed)
Physical Therapy Treatment Patient Details Name: Heather [name] MRN: [id number] DOB: 12/29/1975 Today's Date: 12/21/2020   History of Present Illness 45 y.o. female presented to ED for chest pain and palpitations and feeling like her throat was closing while at her behavioral health facility for SI. Admitted 10/31/20 for treatment of complicated UTI PMH: anxiety and depression (hx of bipolar and schizoaffective disorder in chart in past), ACD, panniculitis, debility,GERD,  OSA not on cpap, chronic back pain.    PT Comments    STAR PT session: Pt continues to make steady progress towards her goals today especially with improving balance to be able to stand without UE assist on RW. Pt is also improving sit>stand and ambulation with only hand rail assist. Although pt is making progress she still requires SNF level rehab to gain complete independence need for housing situation. PT will continue to follow acutely.     Recommendations for follow up therapy are one component of a multi-disciplinary discharge planning process, led by the attending physician.  Recommendations may be updated based on patient status, additional functional criteria and insurance authorization.  Follow Up Recommendations  Other (comment);Supervision - Intermittent (ALF)     Equipment Recommendations  None recommended by PT (owns bariatric RW)       Precautions / Restrictions Precautions Precautions: Fall Precaution Comments: fell prior to hosp Restrictions Weight Bearing Restrictions: No     Mobility  Bed Mobility Overal bed mobility: Needs Assistance Bed Mobility: Supine to Sit;Sit to Supine     Supine to sit: Supervision Sit to supine: Supervision   General bed mobility comments: uses HOB elevated, and rails to pull to side of bed    Transfers Overall transfer level: Needs assistance Equipment used: None Transfers: Sit to/from Stand Sit to Stand: Min guard         General transfer comment:  min guard for initial power up from recliner using bilateral armrests, able to come to standind and self steady before reaching for RW, x3  Ambulation/Gait Ambulation/Gait assistance: Min guard Gait Distance (Feet): 60 Feet (+60, +80 (2x 20') with only R handrail assist) Assistive device: Rolling walker (2 wheeled) (bariatric RW, R sided hand rail in hallway) Gait Pattern/deviations: Step-through pattern;Trunk flexed Gait velocity: too fast to avoid early onset of dyspnea Gait velocity interpretation: <1.31 ft/sec, indicative of household ambulator General Gait Details: min guard for safety,pt able to ambulate with use of handrail in hallway on R, however ambulates too fast and experiences DOE, and second time experiences some R knee buckling, otherwise uses RW with a more appropriate velocity                Architect Wheelchair propulsion: Both upper extremities;Both lower extermities Wheelchair Assistance Details (indicate cue type and reason): requires min A for navigation when [place] to walls, otherwise maximal verbal cuing for steering         Balance Overall balance assessment: Needs assistance Sitting-balance support: No upper extremity supported;Feet supported Sitting balance-Leahy Scale: Fair     Standing balance support: No upper extremity supported Standing balance-Leahy Scale: Fair Standing balance comment: able to stand ~5 min while talking to SW, 3 of which were without UE support                            Cognition Arousal/Alertness: Awake/alert Behavior During Therapy: WFL for tasks assessed/performed Overall Cognitive Status: Within Functional Limits for tasks assessed Area of Impairment: Awareness  Current Attention Level: Selective     Safety/Judgement: Decreased awareness of safety   Problem Solving: Requires verbal cues General Comments: continues to require education on benefits  of therapy everyday despite muscle soreness         General Comments General comments (skin integrity, edema, etc.): VSS on RA      Pertinent Vitals/Pain Pain Assessment: Faces Faces Pain Scale: Hurts little more Pain Location: LBP Pain Descriptors / Indicators: Grimacing;Aching;Discomfort Pain Intervention(s): Limited activity within patient's tolerance;Monitored during session;Repositioned     PT Goals (current goals can now be found in the care plan section) Acute Rehab PT Goals Patient Stated Goal: to reduce pain PT Goal Formulation: With patient Time For Goal Achievement: 12/12/20 Potential to Achieve Goals: Fair Progress towards PT goals: Progressing toward goals    Frequency    Min 3X/week (try to progress activity tolerance to D/C to homeless shelter)      PT Plan Current plan remains appropriate       AM-PAC PT "6 Clicks" Mobility   Outcome Measure  Help needed turning from your back to your side while in a flat bed without using bedrails?: A Little Help needed moving from lying on your back to sitting on the side of a flat bed without using bedrails?: A Little Help needed moving to and from a bed to a chair (including a wheelchair)?: A Little Help needed standing up from a chair using your arms (e.g., wheelchair or bedside chair)?: A Little Help needed to walk in hospital room?: A Little Help needed climbing 3-5 steps with a railing? : A Lot 6 Click Score: 17    End of Session   Activity Tolerance: Patient tolerated treatment well Patient left: in bed;with call bell/phone within reach;with family/visitor present Nurse Communication: Mobility status PT Visit Diagnosis: Unsteadiness on feet (R26.81);Muscle weakness (generalized) (M62.81);History of falling (Z91.81) Pain - Right/Left: Right Pain - part of body: Hip (low back)     Time: [id number] PT Time Calculation (min) (ACUTE ONLY): 49 min  Charges:  $Gait Training: 8-22 mins $Therapeutic  Exercise: 8-22 mins $Therapeutic Activity: 8-22 mins                     Kendall Arnell B. Migdalia Dk PT, DPT Acute Rehabilitation Services Pager [phone] Office [phone]    [place] 12/21/2020, 3:50 PM

## 2020-12-21 NOTE — Plan of Care (Signed)
  Problem: Activity: Goal: Risk for activity intolerance will decrease Outcome: Progressing   Problem: Skin Integrity: Goal: Risk for impaired skin integrity will decrease Outcome: Progressing   

## 2020-12-22 NOTE — Progress Notes (Signed)
Occupational Therapy Treatment Patient Details Name: Heather [name] MRN: [id number] DOB: 08-14-1975 Today's Date: 12/22/2020   History of present illness 45 y.o. female presented to ED for chest pain and palpitations and feeling like her throat was closing while at her behavioral health facility for SI. Admitted 10/31/20 for treatment of complicated UTI PMH: anxiety and depression (hx of bipolar and schizoaffective disorder in chart in past), ACD, panniculitis, debility,GERD,  OSA not on cpap, chronic back pain.   OT comments  Patient met lying supine in bed in agreement with OT treatment session. Patient motivated to work with this Probation officer without requiring coaxing/encouragement this date. Able to don footwear seated EOB with use of AE and set-up assist. Progressed from EOB to bariatric BSC over toilet in bathroom with supervision A for safety and use of bariatric RW. Patient expressed some difficulty with hygiene management after BM's. Will trial toilet tongs at time of next treatment session. Patient making steady progress toward goals. OT will continue to follow 5x weekly as per protocol with [facility] program. Goals updated.    Recommendations for follow up therapy are one component of a multi-disciplinary discharge planning process, led by the attending physician.  Recommendations may be updated based on patient status, additional functional criteria and insurance authorization.    Follow Up Recommendations  Other (comment) (Patient in [facility]AR program to progress to home)    Equipment Recommendations  3 in 1 bedside commode;Tub/shower bench;Other (comment) (Bariatric equipment)    Recommendations for Other Services      Precautions / Restrictions Precautions Precautions: Fall Precaution Comments: fell prior to hosp Restrictions Weight Bearing Restrictions: No       Mobility Bed Mobility Overal bed mobility: Needs Assistance Bed Mobility: Supine to Sit     Supine to sit:  Supervision     General bed mobility comments: HOB slightly elevated and heavy use of bed rails.    Transfers Overall transfer level: Needs assistance Equipment used: None Transfers: Sit to/from Stand Sit to Stand: Min guard         General transfer comment: Sit to stand from EOB and from bariatric BSC to RW with BUE support and heavy use of rails/grab bar. Increased time to rise.    Balance Overall balance assessment: Needs assistance Sitting-balance support: No upper extremity supported;Feet supported Sitting balance-Leahy Scale: Good Sitting balance - Comments: Able to reach outside of BOS without LOB. Limited by body habitus.   Standing balance support: No upper extremity supported Standing balance-Leahy Scale: Fair Standing balance comment: able to stand ~5 min while talking to SW, 3 of which were without UE support                           ADL either performed or assessed with clinical judgement   ADL Overall ADL's : Needs assistance/impaired                 Upper Body Dressing : Set up;Sitting   Lower Body Dressing: Set up Lower Body Dressing Details (indicate cue type and reason): Able to don socks/shoes seated EOB with use of AE and set-up assist. Demonstrates good problem solving skills determining a solution to difficulty threading socks onto sock-aid. Toilet Transfer: Consulting civil engineer Details (indicate cue type and reason): Bariatric BSC over toilet in bathroom Toileting- Clothing Manipulation and Hygiene: Minimal assistance Toileting - Clothing Manipulation Details (indicate cue type and reason): For hygiene management with BM. May benefit from toilet tongs  to increase independence.             Vision       Perception     Praxis      Cognition Arousal/Alertness: Awake/alert Behavior During Therapy: WFL for tasks assessed/performed Overall Cognitive Status: Within Functional Limits for tasks assessed                                  General Comments: Patient with good participation this date.        Exercises     Shoulder Instructions       General Comments      Pertinent Vitals/ Pain       Pain Assessment: Faces Faces Pain Scale: Hurts a little bit Pain Location: R knee and low back (chronic) Pain Descriptors / Indicators: Sore Pain Intervention(s): Limited activity within patient's tolerance;Monitored during session;Premedicated before session;Repositioned  Home Living                                          Prior Functioning/Environment              Frequency  Min 5X/week        Progress Toward Goals  OT Goals(current goals can now be found in the care plan section)  Progress towards OT goals: Progressing toward goals  Acute Rehab OT Goals Patient Stated Goal: to reduce pain OT Goal Formulation: With patient Time For Goal Achievement: 01/05/21 Potential to Achieve Goals: Good ADL Goals Pt Will Perform Grooming: with modified independence;standing Pt Will Perform Lower Body Bathing: with modified independence;sitting/lateral leans;sit to/from stand;with adaptive equipment Pt Will Perform Lower Body Dressing: with modified independence;with adaptive equipment;sitting/lateral leans;sit to/from stand Pt Will Transfer to Toilet: with modified independence;bedside commode;ambulating Pt Will Perform Toileting - Clothing Manipulation and hygiene: with modified independence;with adaptive equipment;sitting/lateral leans;sit to/from stand Pt Will Perform Tub/Shower Transfer: with modified independence;rolling walker  Plan Discharge plan remains appropriate    Co-evaluation                 AM-PAC OT "6 Clicks" Daily Activity     Outcome Measure   Help from another person eating meals?: None Help from another person taking care of personal grooming?: A Little Help from another person toileting, which includes using toliet,  bedpan, or urinal?: A Little Help from another person bathing (including washing, rinsing, drying)?: A Little Help from another person to put on and taking off regular upper body clothing?: A Little Help from another person to put on and taking off regular lower body clothing?: A Little 6 Click Score: 19    End of Session Equipment Utilized During Treatment: Rolling walker  OT Visit Diagnosis: Unsteadiness on feet (R26.81);Other abnormalities of gait and mobility (R26.89);Muscle weakness (generalized) (M62.81);Pain;Other (comment) Pain - Right/Left: Right Pain - part of body: Hip;Knee   Activity Tolerance Patient tolerated treatment well   Patient Left in chair;with call bell/phone within reach   Nurse Communication Mobility status        Time: 1246-1311 OT Time Calculation (min): 25 min  Charges: OT General Charges $OT Visit: 1 Visit OT Treatments $Self Care/Home Management : 23-37 mins  Tanika Bracco H. OTR/L Supplemental OT, Department of rehab services [phone]  Kamiryn Bezanson R H. 12/22/2020, 1:29 PM

## 2020-12-22 NOTE — Progress Notes (Signed)
Physical Therapy Treatment Patient Details Name: Heather [name] MRN: [id number] DOB: June 02, 1975 Today's Date: 12/22/2020   History of Present Illness 45 y.o. female presented to ED for chest pain and palpitations and feeling like her throat was closing while at her behavioral health facility for SI. Admitted 10/31/20 for treatment of complicated UTI PMH: anxiety and depression (hx of bipolar and schizoaffective disorder in chart in past), ACD, panniculitis, debility,GERD,  OSA not on cpap, chronic back pain.    PT Comments    STAR PT session: Pt continues to improve endurance, strength and balance with therapy today. Discussed with pt given her improvement and discharge plan for ALF, pt will no longer be receiving STAR level therapy, but will continue to receive therapy 3x/wk with PT and 2x/wk with OT. Pt voices understanding, and reports she would like other opportunities to walk. PT will speak with AD next week. Pt is min guard for transfers and ambulation of over 295 total feet with min guard. DTP continues to work towards ALF level facility discharge. PT will continue to follow acutely decreased frequency.     Recommendations for follow up therapy are one component of a multi-disciplinary discharge planning process, led by the attending physician.  Recommendations may be updated based on patient status, additional functional criteria and insurance authorization.  Follow Up Recommendations  Other (comment);Supervision - Intermittent (ALF)     Equipment Recommendations  None recommended by PT (owns bariatric RW)       Precautions / Restrictions Precautions Precautions: Fall Precaution Comments: fell prior to hosp Restrictions Weight Bearing Restrictions: No     Mobility  Bed Mobility Overal bed mobility: Needs Assistance Bed Mobility: Supine to Sit;Sit to Supine     Supine to sit: Supervision Sit to supine: Min assist   General bed mobility comments: sitting up in recliner  on entry, very fatigued at end of session and requires very minimal assist to bring L LE into bed    Transfers Overall transfer level: Needs assistance Equipment used: None Transfers: Sit to/from Stand Sit to Stand: Min guard         General transfer comment: vc for hand placement for 4x sit>stand from recliner, able to come to standing without UE steady x1  Ambulation/Gait Ambulation/Gait assistance: Min guard Gait Distance (Feet): 100 Feet (+135,+80, +80) Assistive device: Rolling walker (2 wheeled) (bariatric RW, R sided hand rail in hallway) Gait Pattern/deviations: Step-through pattern;Trunk flexed Gait velocity: within safe limits Gait velocity interpretation: <1.31 ft/sec, indicative of household ambulator General Gait Details: min guard for safety,pt able to ambulate with use of handrail in hallway on R, with safer velocity today, continues to work on breath and velocity to avoid increased DoE, pt with marked increase in endurance today especially since she sat in recliner since morning session         Architect Wheelchair propulsion: Both upper extremities;Both lower extermities Wheelchair Assistance Details (indicate cue type and reason): requires min A for navigation when [place] to walls, otherwise maximal verbal cuing for steering      Balance Overall balance assessment: Needs assistance Sitting-balance support: No upper extremity supported;Feet supported Sitting balance-Leahy Scale: Good Sitting balance - Comments: Able to reach outside of BOS without LOB. Limited by body habitus.   Standing balance support: No upper extremity supported Standing balance-Leahy Scale: Fair Standing balance comment: able to stand ~5 min while talking to SW, 3 of which were without UE support  Cognition Arousal/Alertness: Awake/alert Behavior During Therapy: WFL for tasks assessed/performed Overall Cognitive  Status: Within Functional Limits for tasks assessed Area of Impairment: Awareness                   Current Attention Level: Selective     Safety/Judgement: Decreased awareness of safety   Problem Solving: Requires verbal cues General Comments: discussed drop in PT freq based on her improved mobility and no longer having homeless shelter d/c plan         General Comments General comments (skin integrity, edema, etc.): VSS on RA, boyfriend present during session      Pertinent Vitals/Pain Pain Assessment: Faces Faces Pain Scale: Hurts little more Pain Location: LBP and R knee Pain Descriptors / Indicators: Grimacing;Aching;Discomfort Pain Intervention(s): Limited activity within patient's tolerance;Repositioned;Monitored during session     PT Goals (current goals can now be found in the care plan section) Acute Rehab PT Goals Patient Stated Goal: to reduce pain PT Goal Formulation: With patient Time For Goal Achievement: 12/12/20 Potential to Achieve Goals: Fair Progress towards PT goals: Progressing toward goals    Frequency    Min 3X/week (try to progress activity tolerance to D/C to homeless shelter)      PT Plan Current plan remains appropriate       AM-PAC PT "6 Clicks" Mobility   Outcome Measure  Help needed turning from your back to your side while in a flat bed without using bedrails?: A Little Help needed moving from lying on your back to sitting on the side of a flat bed without using bedrails?: A Little Help needed moving to and from a bed to a chair (including a wheelchair)?: A Little Help needed standing up from a chair using your arms (e.g., wheelchair or bedside chair)?: A Little Help needed to walk in hospital room?: A Little Help needed climbing 3-5 steps with a railing? : A Lot 6 Click Score: 17    End of Session   Activity Tolerance: Patient tolerated treatment well Patient left: in bed;with call bell/phone within reach;with  family/visitor present Nurse Communication: Mobility status PT Visit Diagnosis: Unsteadiness on feet (R26.81);Muscle weakness (generalized) (M62.81);History of falling (Z91.81) Pain - Right/Left: Right Pain - part of body: Hip (low back)     Time: 1455-1540 PT Time Calculation (min) (ACUTE ONLY): 45 min  Charges:  $Gait Training: 23-37 mins $Therapeutic Exercise: 8-22 mins                     Leonte Horrigan B. Migdalia Dk PT, DPT Acute Rehabilitation Services Pager [phone] Office [phone]    [place] 12/22/2020, 4:19 PM

## 2020-12-22 NOTE — Progress Notes (Signed)
PROGRESS NOTE    Heather [name]  [id number] DOB: 11-22-1975 DOA: 10/30/2020 PCP: Lucianne Lei, MD   Chief Complaint  Patient presents with   BH/CP  Brief Narrative: 45 year old female with multiple comorbidities including morbid obesity , T2DM, OSA/COPD, MDD, GAD, insomnia, RLS, renal mass, numerous small wounds under her pannus upper thighs and between her legs presented to the ED 7/25 with right flank and suprapubic pain, nausea vomiting and was admitted for complicated UTI. Treated with antibiotics seen by PT OT has been stable for discharge awaiting on placement to skilled nursing facility. But due to patient's homelessness status  insurance issues, disposition has been difficulty.  Subjective: No new complaints, mild pain on the  swelling on the left upper back left shoulder No acute events overnight, resting comfortably.  Assessment & Plan:  Complicated UTI Enterobacter clonization Nonobstructing nephrolithiasis- calculus on the left Renal cyst 8.3 cm right upper pole At this time asymptomatic.  Patient will need urology follow-up.  Afebrile..   Suicidal ideation-None currently Generalized anxiety/depression Insomnia Seen by psychiatry cleared for discharge Her mood is stable on current meds-Wellbutrin, trazodone, Topamax, Neurontin, prn clonazepam .   Suspected OSA/OHS-supplemental oxygen as needed.  Outpatient sleep apnea evaluation.    Cystic acne on the  leftupper back and  mid back- Cont doxycycline 9/11- cont through 9/18-May need extension if still present  Right hip/back/leg pain Known disc herniation on imaging-thoracic and lumbar spine Continue pain control Lyrica, meloxicam antispasm meds cont PTOT.  Because of chronic pain from this condition patient has not been able to obtain appropriate employment.   RLS continue Requip   CKD stage IIIb-off Lasix monitor closely on NSAID.  Leg swelling, b/l mild-appears lymphedematous with obesity-s/p PO Lasix  x1 9/10.  As needed Lasix.  Monitor weight  Filed Weights   11/26/20 0500 11/27/20 0546 12/03/20 0600  Weight: (!) 196 kg (!) 196 kg (!) 194 kg     Morbid obesity with BMI  76: Will benefit with outpatient follow-up weight loss lifestyle   Yeast panniculitis-improved.  Continue nystatin   Generalized deconditioning/weakness/homelessness/difficulty with disposition: Pending SNF placement.  Status post COVID-vaccine Moderna x1 8/1 and [place] x1 5/23  Difficult placement TOC following: Multiple places unable to offer bed due to her overweight  Diet Order             Diet regular Room service appropriate? Yes; Fluid consistency: Thin  Diet effective now           Diet general                  Patient's Body mass index is 75.76 kg/m. DVT prophylaxis:   Lovenox, sq, encourage ambulation Code Status:   Code Status: Full Code  Family Communication: plan of care discussed with patient at bedside. Status is: Inpatient Remains inpatient appropriate because:Unsafe d/c plan Dispo:  Patient From: homeless   Planned Disposition: [place]  Medically stable for discharge Difficult to place. Yes  Unresulted Labs (From admission, onward)     Start     Ordered   11/07/20 0500  Creatinine, serum  (enoxaparin (LOVENOX)    CrCl >/= 30 ml/min)  Weekly,   R     Comments: while on enoxaparin therapy    10/31/20 1548           Medications reviewed:  Scheduled Meds:  ascorbic acid  500 mg Oral Daily   bacitracin   Topical BID   buPROPion ER  200 mg Oral  BID   clotrimazole   Topical BID   diclofenac Sodium  4 g Topical QID   doxycycline  100 mg Oral Q12H   enoxaparin (LOVENOX) injection  100 mg Subcutaneous Q24H   fluticasone  2 spray Each Nare BID   methocarbamol  500 mg Oral QID   mometasone-formoterol  2 puff Inhalation BID   montelukast  10 mg Oral QHS   nystatin   Topical TID   pantoprazole  40 mg Oral Daily   pregabalin  100 mg Oral TID   rOPINIRole  4 mg  Oral QHS   senna-docusate  2 tablet Oral Daily   sodium chloride flush  3 mL Intravenous Q12H   topiramate  25 mg Oral BID   traZODone  50 mg Oral QHS   umeclidinium bromide  1 puff Inhalation Daily   Continuous Infusions:  sodium chloride     Consultants:see note  Procedures:see note Antimicrobials: Anti-infectives (From admission, onward)    Start     Dose/Rate Route Frequency Ordered Stop   12/17/20 1030  doxycycline (VIBRA-TABS) tablet 100 mg        100 mg over 7 Days Oral Every 12 hours 12/17/20 0942     11/01/20 1000  ceFEPIme (MAXIPIME) 2 g in sodium chloride 0.9 % 100 mL IVPB        2 g 200 mL/hr over 30 Minutes Intravenous Every 12 hours 10/31/20 1550 11/03/20 2309   11/01/20 0600  ceFEPIme (MAXIPIME) 2 g in sodium chloride 0.9 % 100 mL IVPB  Status:  Discontinued        2 g 200 mL/hr over 30 Minutes Intravenous Every 8 hours 10/31/20 1541 10/31/20 1550   10/31/20 1000  ertapenem (INVANZ) 1,000 mg in sodium chloride 0.9 % 100 mL IVPB  Status:  Discontinued        1 g 200 mL/hr over 30 Minutes Intravenous Every 24 hours 10/31/20 0948 10/31/20 1541      Culture/Microbiology    Component Value Date/Time   SDES  10/26/2020 1800    URINE, CLEAN CATCH Performed at [facility], [place] [address], [place], [place] [address]    SPECREQUEST  10/26/2020 1800    NONE Performed at [facility], [place] [address] [place], [place] [address]    CULT MULTIPLE SPECIES PRESENT, SUGGEST RECOLLECTION (A) 10/26/2020 1800   REPTSTATUS 10/28/2020 FINAL 10/26/2020 1800    Other culture-see note  Objective: Vitals: Today's Vitals   12/21/20 2220 12/22/20 0515 12/22/20 0802 12/22/20 0945  BP:  (!) 118/58  (!) 116/59  Pulse:  68  76  Resp:  18  18  Temp:  98.6 F (37 C)  98.9 F (37.2 C)  TempSrc:      SpO2:  100% 100% 100%  Weight:      Height:      PainSc: Asleep 0-No pain      Intake/Output Summary (Last 24 hours) at 12/22/2020  1137 Last data filed at 12/22/2020 0800 Gross per 24 hour  Intake 1060 ml  Output --  Net 1060 ml    Filed Weights   11/26/20 0500 11/27/20 0546 12/03/20 0600  Weight: (!) 196 kg (!) 196 kg (!) 194 kg   Weight change:   Intake/Output from previous day: 09/15 0701 - 09/16 0700 In: 1080 [P.O.:1080] Out: 0  Intake/Output this shift: Total I/O In: 220 [P.O.:220] Out: -  Filed Weights   11/26/20 0500 11/27/20 0546 12/03/20 0600  Weight: (!) 196 kg Marland Kitchen)  196 kg (!) 194 kg   Examination: General exam: AAOx3, obese, pleasant, not in distress HEENT:Oral mucosa moist, Ear/Nose WNL grossly, dentition normal. Respiratory system: bilaterally clear, no use of accessory muscle Cardiovascular system: S1 & S2 +, No JVD,. Gastrointestinal system: Abdomen soft, NT,ND, BS+ Nervous System:Alert, awake, moving extremities and grossly nonfocal Extremities: no edema, distal peripheral pulses palpable.  Skin: Acne/swelling with tenderness mild edema on the top on left upper back and left mid back - MSK: Normal muscle bulk,tone, power   Data Reviewed: I have personally reviewed following labs and imaging studies CBC: Recent Labs  Lab 12/21/20 0338  WBC 10.7*  HGB 9.4*  HCT 31.9*  MCV 85.3  PLT [id number]    Basic Metabolic Panel: Recent Labs  Lab 12/16/20 0315 12/19/20 0501 12/21/20 0338  NA 137  --  138  K 3.9  --  3.9  CL 108  --  109  CO2 21*  --  21*  GLUCOSE 85  --  88  BUN 20  --  26*  CREATININE 1.21* 1.30* 1.38*  CALCIUM 8.9  --  9.1    GFR: Estimated Creatinine Clearance: 91.4 mL/min (A) (by C-G formula based on SCr of 1.38 mg/dL (H)). Liver Function Tests: No results for input(s): AST, ALT, ALKPHOS, BILITOT, PROT, ALBUMIN in the last 168 hours. No results for input(s): LIPASE, AMYLASE in the last 168 hours. No results for input(s): AMMONIA in the last 168 hours. Coagulation Profile: No results for input(s): INR, PROTIME in the last 168 hours. Cardiac Enzymes: No results  for input(s): CKTOTAL, CKMB, CKMBINDEX, TROPONINI in the last 168 hours. BNP (last 3 results) No results for input(s): PROBNP in the last 8760 hours. HbA1C: No results for input(s): HGBA1C in the last 72 hours. CBG: No results for input(s): GLUCAP in the last 168 hours. Lipid Profile: No results for input(s): CHOL, HDL, LDLCALC, TRIG, CHOLHDL, LDLDIRECT in the last 72 hours. Thyroid Function Tests: No results for input(s): TSH, T4TOTAL, FREET4, T3FREE, THYROIDAB in the last 72 hours. Anemia Panel: No results for input(s): VITAMINB12, FOLATE, FERRITIN, TIBC, IRON, RETICCTPCT in the last 72 hours. Sepsis Labs: No results for input(s): PROCALCITON, LATICACIDVEN in the last 168 hours.  No results found for this or any previous visit (from the past 240 hour(s)).   Radiology Studies: No results found.   LOS: 55 days   Antonieta Pert, MD [facility]  12/22/2020, 11:37 AM

## 2020-12-22 NOTE — TOC Progression Note (Signed)
Transition of Care [facility]) - Progression Note    Patient Details  Name: Heather [name] MRN: [id number] Date of Birth: December 12, 1975  Transition of Care [facility]) CM/SW Ackerman, RN Phone Number: 12/22/2020, 2:21 PM  Clinical Narrative:    Called and left a message with Hydia, CM at [facility] to ask if the facility was able to offer an admission bed to the patient.  Hydia, CM will follow up regarding possibility of bed offer.  Rolena Infante, MSW Supervisor is offering LOG for placement assistance and [employer] is aware but has not made an offer at this time.   Expected Discharge Plan: Skilled Nursing Facility Barriers to Discharge: [place] ([address]), Homeless with medical needs (Homeless)  Expected Discharge Plan and Services Expected Discharge Plan: [place] In-house Referral: Clinical Social Work   Post Acute Care Choice: [place] (Needs LTC bed) Living arrangements for the past 2 months: Homeless Expected Discharge Date: 11/02/20                                     Social Determinants of Health (SDOH) Interventions    Readmission Risk Interventions Readmission Risk Prevention Plan 12/08/2020  Transportation Screening Complete  Medication Review Press photographer) Complete  PCP or Specialist appointment within 3-5 days of discharge Complete  HRI or [place] Complete  SW Recovery Care/Counseling Consult Complete  [place] Not Applicable  Some recent data might be hidden

## 2020-12-23 NOTE — Plan of Care (Signed)
  Problem: Activity: Goal: Risk for activity intolerance will decrease Outcome: Progressing   

## 2020-12-23 NOTE — Progress Notes (Signed)
PROGRESS NOTE    Heather [name]  [id number] DOB: Nov 13, 1975 DOA: 10/30/2020 PCP: Lucianne Lei, MD   Chief Complaint  Patient presents with   BH/CP  Brief Narrative: 45 year old female with multiple comorbidities including morbid obesity , T2DM, OSA/COPD, MDD, GAD, insomnia, RLS, renal mass, numerous small wounds under her pannus upper thighs and between her legs presented to the ED 7/25 with right flank and suprapubic pain, nausea vomiting and was admitted for complicated UTI. Treated with antibiotics seen by PT OT has been stable for discharge awaiting on placement to skilled nursing facility. But due to patient's homelessness status  insurance issues, disposition has been difficulty.  Subjective: Resting comfortably.  No complaints.  Watching her mobile.  Assessment & Plan:  Complicated UTI Enterobacter clonization Nonobstructing nephrolithiasis- calculus on the left Renal cyst 8.3 cm right upper pole At this time asymptomatic.  Patient will need urology follow-up.  Afebrile..   Suicidal ideation-None currently Generalized anxiety/depression Insomnia Seen by psychiatry cleared for discharge Her mood is stable on current meds-Wellbutrin, trazodone, Topamax, Neurontin, prn clonazepam .   Suspected OSA/OHS-supplemental oxygen as needed.  Outpatient sleep apnea evaluation.    Cystic acne on the  leftupper back-however redness is much better and  mid upper back-also improving, continue doxycycline through 9/18- reasses may need extension if still present.  Right hip/back/leg pain Known disc herniation on imaging-thoracic and lumbar spine Continue pain control Lyrica, meloxicam antispasm meds cont PTOT.  Because of chronic pain from this condition patient has not been able to obtain appropriate employment.   RLS continue Requip   CKD stage IIIb-off Lasix monitor closely on NSAID.  Leg swelling, b/l mild-appears lymphedematous with obesity-s/p PO Lasix x1 9/10.  As  needed Lasix.  Monitor weight  Filed Weights   11/27/20 0546 12/03/20 0600 12/23/20 0509  Weight: (!) 196 kg (!) 194 kg (!) 194.5 kg     Morbid obesity with BMI  76: Will benefit with outpatient follow-up weight loss lifestyle   Yeast panniculitis-improved.  Continue nystatin   Generalized deconditioning/weakness/homelessness/difficulty with disposition: Pending SNF placement.  Status post COVID-vaccine Moderna x1 8/1 and [place] x1 5/23  Difficult placement TOC following: Multiple places unable to offer bed due to her overweight  Diet Order             Diet regular Room service appropriate? Yes; Fluid consistency: Thin  Diet effective now           Diet general                  Patient's Body mass index is 75.96 kg/m. DVT prophylaxis:   Lovenox, sq, encourage ambulation Code Status:   Code Status: Full Code  Family Communication: plan of care discussed with patient at bedside. Status is: Inpatient Remains inpatient appropriate because:Unsafe d/c plan Dispo:  Patient From: homeless   Planned Disposition: [place]  Medically stable for discharge Difficult to place. Yes  Unresulted Labs (From admission, onward)     Start     Ordered   11/07/20 0500  Creatinine, serum  (enoxaparin (LOVENOX)    CrCl >/= 30 ml/min)  Weekly,   R     Comments: while on enoxaparin therapy    10/31/20 1548           Medications reviewed:  Scheduled Meds:  ascorbic acid  500 mg Oral Daily   bacitracin   Topical BID   buPROPion ER  200 mg Oral BID   clotrimazole   Topical  BID   diclofenac Sodium  4 g Topical QID   doxycycline  100 mg Oral Q12H   enoxaparin (LOVENOX) injection  100 mg Subcutaneous Q24H   fluticasone  2 spray Each Nare BID   methocarbamol  500 mg Oral QID   mometasone-formoterol  2 puff Inhalation BID   montelukast  10 mg Oral QHS   nystatin   Topical TID   pantoprazole  40 mg Oral Daily   pregabalin  100 mg Oral TID   rOPINIRole  4 mg Oral QHS    senna-docusate  2 tablet Oral Daily   sodium chloride flush  3 mL Intravenous Q12H   topiramate  25 mg Oral BID   traZODone  50 mg Oral QHS   umeclidinium bromide  1 puff Inhalation Daily   Continuous Infusions:  sodium chloride     Consultants:see note  Procedures:see note Antimicrobials: Anti-infectives (From admission, onward)    Start     Dose/Rate Route Frequency Ordered Stop   12/17/20 1030  doxycycline (VIBRA-TABS) tablet 100 mg        100 mg over 7 Days Oral Every 12 hours 12/17/20 0942     11/01/20 1000  ceFEPIme (MAXIPIME) 2 g in sodium chloride 0.9 % 100 mL IVPB        2 g 200 mL/hr over 30 Minutes Intravenous Every 12 hours 10/31/20 1550 11/03/20 2309   11/01/20 0600  ceFEPIme (MAXIPIME) 2 g in sodium chloride 0.9 % 100 mL IVPB  Status:  Discontinued        2 g 200 mL/hr over 30 Minutes Intravenous Every 8 hours 10/31/20 1541 10/31/20 1550   10/31/20 1000  ertapenem (INVANZ) 1,000 mg in sodium chloride 0.9 % 100 mL IVPB  Status:  Discontinued        1 g 200 mL/hr over 30 Minutes Intravenous Every 24 hours 10/31/20 0948 10/31/20 1541      Culture/Microbiology    Component Value Date/Time   SDES  10/26/2020 1800    URINE, CLEAN CATCH Performed at [facility], [place] [address] [place], [place] [address]    SPECREQUEST  10/26/2020 1800    NONE Performed at [facility], [place] [address] [place], [place] [address]    CULT MULTIPLE SPECIES PRESENT, SUGGEST RECOLLECTION (A) 10/26/2020 1800   REPTSTATUS 10/28/2020 FINAL 10/26/2020 1800    Other culture-see note  Objective: Vitals: Today's Vitals   12/22/20 2024 12/23/20 0509 12/23/20 0852 12/23/20 0900  BP: 129/60 (!) 105/57  120/69  Pulse: 83 70  99  Resp: '16 16  18  '$ Temp: 98.4 F (36.9 C) 97.7 F (36.5 C)  98 F (36.7 C)  TempSrc: Oral Oral    SpO2: 99% 99% 97% 98%  Weight:  (!) 194.5 kg    Height:      PainSc:        Intake/Output Summary (Last 24 hours) at  12/23/2020 1107 Last data filed at 12/23/2020 0826 Gross per 24 hour  Intake 734 ml  Output 0 ml  Net 734 ml    Filed Weights   11/27/20 0546 12/03/20 0600 12/23/20 0509  Weight: (!) 196 kg (!) 194 kg (!) 194.5 kg   Weight change:   Intake/Output from previous day: 09/16 0701 - 09/17 0700 In: 694 [P.O.:694] Out: -  Intake/Output this shift: Total I/O In: 260 [P.O.:260] Out: 0  Filed Weights   11/27/20 0546 12/03/20 0600 12/23/20 0509  Weight: (!) 196 kg (!) 194 kg Marland Kitchen)  194.5 kg   Examination: General exam: AAOx3, oebse, pleasant. HEENT:Oral mucosa moist, Ear/Nose WNL grossly, dentition normal. Respiratory system: bilaterally diminished, no use of accessory muscle Cardiovascular system: S1 & S2 +, No JVD,. Gastrointestinal system: Abdomen soft,obese, NT,ND, BS+ Nervous System:Alert, awake, moving extremities and grossly nonfocal Extremities: no edema, distal peripheral pulses palpable.  Skin: No rashes,no icterus. MSK: Normal muscle bulk,tone, power  Left upper back and upper mild erythematous tender swelling- improving , Data Reviewed: I have personally reviewed following labs and imaging studies CBC: Recent Labs  Lab 12/21/20 0338  WBC 10.7*  HGB 9.4*  HCT 31.9*  MCV 85.3  PLT [id number]    Basic Metabolic Panel: Recent Labs  Lab 12/19/20 0501 12/21/20 0338  NA  --  138  K  --  3.9  CL  --  109  CO2  --  21*  GLUCOSE  --  88  BUN  --  26*  CREATININE 1.30* 1.38*  CALCIUM  --  9.1    GFR: Estimated Creatinine Clearance: 91.5 mL/min (A) (by C-G formula based on SCr of 1.38 mg/dL (H)). Liver Function Tests: No results for input(s): AST, ALT, ALKPHOS, BILITOT, PROT, ALBUMIN in the last 168 hours. No results for input(s): LIPASE, AMYLASE in the last 168 hours. No results for input(s): AMMONIA in the last 168 hours. Coagulation Profile: No results for input(s): INR, PROTIME in the last 168 hours. Cardiac Enzymes: No results for input(s): CKTOTAL, CKMB,  CKMBINDEX, TROPONINI in the last 168 hours. BNP (last 3 results) No results for input(s): PROBNP in the last 8760 hours. HbA1C: No results for input(s): HGBA1C in the last 72 hours. CBG: No results for input(s): GLUCAP in the last 168 hours. Lipid Profile: No results for input(s): CHOL, HDL, LDLCALC, TRIG, CHOLHDL, LDLDIRECT in the last 72 hours. Thyroid Function Tests: No results for input(s): TSH, T4TOTAL, FREET4, T3FREE, THYROIDAB in the last 72 hours. Anemia Panel: No results for input(s): VITAMINB12, FOLATE, FERRITIN, TIBC, IRON, RETICCTPCT in the last 72 hours. Sepsis Labs: No results for input(s): PROCALCITON, LATICACIDVEN in the last 168 hours.  No results found for this or any previous visit (from the past 240 hour(s)).   Radiology Studies: No results found.   LOS: 77 days   Antonieta Pert, MD [facility]  12/23/2020, 11:07 AM

## 2020-12-24 MED ORDER — ALBUTEROL SULFATE HFA 108 (90 BASE) MCG/ACT IN AERS
1.0000 | INHALATION_SPRAY | RESPIRATORY_TRACT | Status: DC | PRN
  Filled 2020-12-24: qty 6.7

## 2020-12-24 NOTE — Plan of Care (Signed)
  Problem: Health Behavior/Discharge Planning: Goal: Ability to manage health-related needs will improve Outcome: Progressing   Problem: Activity: Goal: Risk for activity intolerance will decrease Outcome: Progressing   Problem: Skin Integrity: Goal: Risk for impaired skin integrity will decrease Outcome: Progressing   

## 2020-12-24 NOTE — Progress Notes (Signed)
Heather [name]  [id number] DOB: [birth year] DOA: 10/30/2020 PCP: Lucianne Lei, MD    Brief Narrative:  45 year old female with a hx of morbid obesity, DM2, OSA/COPD, MDD, GAD, insomnia, RLS, renal mass, numerous small wounds under her pannus upper thighs and between her legs who presented to the ED 7/25 with right flank and suprapubic pain, nausea vomiting and was admitted for complicated UTI.  Significant Events:  Has completed treatment with antibiotics. Seen by PT/OT and has been stable for discharge awaiting skilled nursing facility placement.  Placement proving very difficult due to the patient's homelessness and lack of insurance.  Consultants:  None  Code Status: FULL CODE  Antimicrobials:  Doxycycline 9/11 > 9/18  DVT prophylaxis: Lovenox  Subjective: Sitting up in a bedside chair.  Denies chest pain shortness of breath nausea or vomiting.  States she is feeling well today.  No new complaints.  Assessment & Plan:  Complicated UTI with Enterobacter colonization Asymptomatic -has completed antibiotic therapy  Nonobstructing nephrolithiasis with calculus on the left -8.3 cm right upper pole renal cyst Outpatient urology follow-up recommended  Depression/anxiety -history of suicidal ideation Evaluated by psychiatry during this hospital stay and cleared for discharge -continue Wellbutrin, trazodone, Topamax, Neurontin, as needed clonazepam  Suspected OSA/OHS Will need outpatient sleep evaluation  Cystic acne of left back Continue doxycycline through 9/18 - much improved/nearly resolved on exam   Right hip/back/leg pain -known disc herniation Continue Lyrica and meloxicam  Restless leg syndrome Continue Requip  CKD stage IIIb Creatinine stable  Lymphedema of obesity  Morbid obesity - Body mass index is 75.96 kg/m.  Yeast panniculitis Improving with nystatin  Generalized deconditioning with homelessness SNF placement pending   Family  Communication: spoke w/ family member at bedside  Status is: Inpatient  Remains inpatient appropriate because:Unsafe d/c plan  Dispo:  Patient From:    Planned Disposition: [place]  Medically stable for discharge:         Objective: Blood pressure 123/61, pulse 72, temperature 97.7 F (36.5 C), temperature source Oral, resp. rate 20, height '5\' 3"'$  (1.6 m), weight (!) 194.5 kg, SpO2 98 %.  Intake/Output Summary (Last 24 hours) at 12/24/2020 0958 Last data filed at 12/24/2020 0600 Gross per 24 hour  Intake 960 ml  Output 0 ml  Net 960 ml   Filed Weights   11/27/20 0546 12/03/20 0600 12/23/20 0509  Weight: (!) 196 kg (!) 194 kg (!) 194.5 kg    Examination: General: No acute respiratory distress Lungs: Clear to auscultation bilaterally without wheezes or crackles Cardiovascular: Regular rate and rhythm without murmur gallop or rub normal S1 and S2 Abdomen: Nontender, nondistended, soft, bowel sounds positive, no rebound, no ascites, no appreciable mass Extremities: No significant cyanosis, clubbing, or edema bilateral lower extremities  CBC: Recent Labs  Lab 12/21/20 0338  WBC 10.7*  HGB 9.4*  HCT 31.9*  MCV 85.3  PLT [id number]   Basic Metabolic Panel: Recent Labs  Lab 12/19/20 0501 12/21/20 0338  NA  --  138  K  --  3.9  CL  --  109  CO2  --  21*  GLUCOSE  --  88  BUN  --  26*  CREATININE 1.30* 1.38*  CALCIUM  --  9.1   GFR: Estimated Creatinine Clearance: 91.5 mL/min (A) (by C-G formula based on SCr of 1.38 mg/dL (H)).  HbA1C: Hgb A1c MFr Bld  Date/Time Value Ref Range Status  08/10/2020 05:06 AM 5.6 4.8 - 5.6 % Final  Comment:    (NOTE) Pre diabetes:          5.7%-6.4%  Diabetes:              >6.4%  Glycemic control for   <7.0% adults with diabetes   09/24/2018 04:59 PM 5.3 4.8 - 5.6 % Final    Comment:             Prediabetes: 5.7 - 6.4          Diabetes: >6.4          Glycemic control for adults with diabetes: <7.0       Scheduled Meds:  ascorbic acid  500 mg Oral Daily   bacitracin   Topical BID   buPROPion ER  200 mg Oral BID   clotrimazole   Topical BID   diclofenac Sodium  4 g Topical QID   doxycycline  100 mg Oral Q12H   enoxaparin (LOVENOX) injection  100 mg Subcutaneous Q24H   fluticasone  2 spray Each Nare BID   methocarbamol  500 mg Oral QID   mometasone-formoterol  2 puff Inhalation BID   montelukast  10 mg Oral QHS   nystatin   Topical TID   pantoprazole  40 mg Oral Daily   pregabalin  100 mg Oral TID   rOPINIRole  4 mg Oral QHS   senna-docusate  2 tablet Oral Daily   sodium chloride flush  3 mL Intravenous Q12H   topiramate  25 mg Oral BID   traZODone  50 mg Oral QHS   umeclidinium bromide  1 puff Inhalation Daily   Continuous Infusions:  sodium chloride       LOS: 53 days   Cherene Altes, MD [facility] Office  [phone] Pager - Text Page per Shea Evans  If 7PM-7AM, please contact night-coverage per Amion 12/24/2020, 9:58 AM

## 2020-12-25 NOTE — Progress Notes (Signed)
Physical Therapy Treatment Patient Details Name: Heather [name] MRN: [id number] DOB: 05/19/1975 Today's Date: 12/25/2020   History of Present Illness 45 y.o. female presented to ED for chest pain and palpitations and feeling like her throat was closing while at her behavioral health facility for SI. Admitted 10/31/20 for treatment of complicated UTI PMH: anxiety and depression (hx of bipolar and schizoaffective disorder in chart in past), ACD, panniculitis, debility,GERD,  OSA not on cpap, chronic back pain.    PT Comments    Pt sitting up in recliner on entry reporting that she was able to walk over the weekend with staff and thinks she is getting stronger. Focus of session working on functional task. Unit AD suggested pt wipe down the counters at the RN station. Pt able to perform ambulation and cleaning tasks with min guard and seated rest breaks. Pt continues to make progress towards independence. PT will continue to follow to improve strength and endurance.    Recommendations for follow up therapy are one component of a multi-disciplinary discharge planning process, led by the attending physician.  Recommendations may be updated based on patient status, additional functional criteria and insurance authorization.  Follow Up Recommendations  Other (comment);Supervision - Intermittent (ALF)     Equipment Recommendations  None recommended by PT (owns bariatric RW)       Precautions / Restrictions Precautions Precautions: Fall Precaution Comments: fell prior to hosp Restrictions Weight Bearing Restrictions: No     Mobility  Bed Mobility                    Transfers Overall transfer level: Needs assistance Equipment used: None Transfers: Sit to/from Stand Sit to Stand: Min guard         General transfer comment: min guard for power up from recliner x3 during session  Ambulation/Gait Ambulation/Gait assistance: Min guard Gait Distance (Feet): 80 Feet (+40,  +200) Assistive device: Rolling walker (2 wheeled) (bariatric RW, R sided hand rail in hallway) Gait Pattern/deviations: Step-through pattern;Trunk flexed Gait velocity: within safe limits Gait velocity interpretation: 1.31 - 2.62 ft/sec, indicative of limited community ambulator General Gait Details: min guard for safety pt able to ambulate to RN station and don gloves and wipe down counters with 1x seated rest break during task, after additional rest break pt able to walk to end of hallway and back to her room       Balance Overall balance assessment: Needs assistance Sitting-balance support: No upper extremity supported;Feet supported Sitting balance-Leahy Scale: Good     Standing balance support: No upper extremity supported Standing balance-Leahy Scale: Fair Standing balance comment: able to stand ~5 min while talking to SW, 3 of which were without UE support                            Cognition Arousal/Alertness: Awake/alert Behavior During Therapy: WFL for tasks assessed/performed Overall Cognitive Status: Within Functional Limits for tasks assessed Area of Impairment: Safety/judgement;Problem solving                   Current Attention Level: Alternating     Safety/Judgement: Decreased awareness of safety   Problem Solving: Requires verbal cues General Comments: continues to require cuing for safety with mobility      Exercises Other Exercises Other Exercises: wiping down counters with single UE support    General Comments General comments (skin integrity, edema, etc.): VSS on RA  Pertinent Vitals/Pain Pain Assessment: No/denies pain     PT Goals (current goals can now be found in the care plan section) Acute Rehab PT Goals Patient Stated Goal: to reduce pain PT Goal Formulation: With patient Time For Goal Achievement: 12/12/20 Potential to Achieve Goals: Fair Progress towards PT goals: Progressing toward goals    Frequency     Min 3X/week      PT Plan Current plan remains appropriate       AM-PAC PT "6 Clicks" Mobility   Outcome Measure  Help needed turning from your back to your side while in a flat bed without using bedrails?: A Little Help needed moving from lying on your back to sitting on the side of a flat bed without using bedrails?: A Little Help needed moving to and from a bed to a chair (including a wheelchair)?: A Little Help needed standing up from a chair using your arms (e.g., wheelchair or bedside chair)?: A Little Help needed to walk in hospital room?: A Little Help needed climbing 3-5 steps with a railing? : A Lot 6 Click Score: 17    End of Session   Activity Tolerance: Patient tolerated treatment well Patient left: with call bell/phone within reach;in chair Nurse Communication: Mobility status PT Visit Diagnosis: Unsteadiness on feet (R26.81);Muscle weakness (generalized) (M62.81);History of falling (Z91.81) Pain - Right/Left: Right Pain - part of body: Hip (low back)     Time: 5364-6803 PT Time Calculation (min) (ACUTE ONLY): 36 min  Charges:  $Therapeutic Exercise: 8-22 mins $Self Care/Home Management: 8-22                     Chanise Habeck B. Migdalia Dk PT, DPT Acute Rehabilitation Services Pager [phone] Office [phone]    Sac 12/25/2020, 3:44 PM

## 2020-12-25 NOTE — Care Plan (Signed)
Seen examined Resting well. No complaints Discussed with NP and during MDR rounds. Pt has no complaints. Back swelling no more red on left shoulder back  Issues: UTI/Enterobacter colonization/Nephrolithiasis-non obstructive/Renal cyst- no fever, treated SI/GAD/Depression/Insomnia-stable mood, no more suicidal Possible OSA/OHS- sleep eval as OP Cystic acne- improving, treated w. doxy RLS CKDIIIb-monitor renal fun intermittently Chronic pain- hip pain, back pain, disc herniation-cont pain control. Yeast panniculitis- treated, nystatin. Leg edema-monitor- prn lasix. Morbid obesity w/ BMI 75- wt loss, pcp fu. Deconditioning -PTOT DTP- TOC involved.

## 2020-12-25 NOTE — Progress Notes (Signed)
[facility] PROGRESS NOTE  Heather [name] [id number] DOB: [birth year] DOA: 10/30/2020 PCP: Lucianne Lei, MD  Status: Remains inpatient appropriate because:Unsafe d/c plan  Dispo:  Patient From:  Homeless-newly homeless since May 2022  Planned Disposition: ALF  Medically stable for discharge: Yes  Barriers to discharge: Morbid obesity prohibits safe discharge to homeless shelter given decreased mobility and sheer size prohibits her from sleeping on beds available at the majority of homeless shelterS/disability pending    Level of care: Med-Surg  Code Status: Full Family Communication: Patient only DVT prophylaxis: Lovenox COVID vaccination status: Moderna x1 on 11/06/2020  and Pfizer x1 on 08/28/2020    HPI: 45 y.o. female with PMH significant for morbid obesity, DM2, obstructive sleep apnea, COPD, MDD, GAD, insomnia and RLS,renal mass,  and numerous small wounds under her pannus, upper thighs and between her legs.   Patient presented to the ED on 7/25 presented with right flank and suprapubic pain along with nausea and vomiting.   She was admitted and treated for complicated UTI with antibiotics and has completed treatment.  Currently medically stable for discharge to SNF.  Subjective: Alert.  Sitting in bed looking on phone.  States she has an appointment to speak with disability on Thursday 9/22.  Very excited that she was able to mobilize 300 feet with a walker while working with therapy this weekend.  Continues to express desire to improve mobility and is hopeful can 1 day live independently so she can regain custody of her children.   Objective: Vitals:   12/25/20 0430 12/25/20 0833  BP: (!) 102/54   Pulse: 78   Resp: 20   Temp: 97.9 F (36.6 C)   SpO2: 100% 100%    Intake/Output Summary (Last 24 hours) at 12/25/2020 0837 Last data filed at 12/25/2020 0500 Gross per 24 hour  Intake 240 ml  Output 0 ml  Net 240 ml   Filed Weights   11/27/20 0546  12/03/20 0600 12/23/20 0509  Weight: (!) 196 kg (!) 194 kg (!) 194.5 kg    Exam:  Constitutional: NAD, calm Respiratory: Anterior lung sounds clear, room air, normal pulse oximetry Cardiovascular: S1-S2, normotensive, regular pulse Abdomen: Soft and nontender.  Obese.  Bowel sounds positive. LBM 9/8, eating well Skin: Cystic acne on back significantly improved in some areas nearly resolved after treatment with antibiotics Neurologic: CN 2-12 grossly intact. Sensation intact, Strength 4/5 x all 4 extremities.  Psychiatric: Normal judgment and insight. Alert and oriented x 3.    Assessment/Plan: Acute problems: Complicated UTI presumed 2/2 Enterobacter colonization -Patient completed 3 days of cefepime, urine culture colonized,   Nonobstructing nephrolithiasis Renal cyst -CT abdomen and pelvis from 7/26 showed bilateral nonobstructing stones including a staghorn calculus on the left.  Can follow-up with urology as an outpatient. -CT scan abdomen from 7/19 also showed up 8.3 cm right upper pole renal cyst likely benign.   Suicidal ideation/anxiety and depression -Psychiatry has cleared the patient for discharge.  OP FU rec -Continue Wellbutrin, trazodone, and prn clonazepam  Suspected OSA/OHS -Patient with O2 saturations between 90 and 94% while sleeping -Recommend outpatient sleep study  Right hip/back/leg pain and patient with known disc herniation on imaging -Discussed with IR regarding possible image guided injection of SI joint: 2 radiologists reviewed this patient, one is IR and one is a musculoskeletal rad, they both said that because of this patient's habitus/lack of equipment here in the hospital we won't be able to do an SI injection. You can place  an order for this to be attempted at our outpatient spine center ([address]) but it would need to be reviewed by the radiologists there before it would be scheduled.  -Given size of leg and knee as well as current skin  breakdown she is not a candidate for splint or other stabilizing device to right knee. -Back pain (chronic).  Continue outpatient pain medications (Topamax, meloxicam and Robaxin).  -Continue Lyrica and Oxy IR  Asthma -Patient complaining of nocturnal wheezing and difficulty breathing which is not improved with use of albuterol rescue inhaler and is requesting nebulizer -Continue DuoNebs as needed -Started on long-acting agents this admission: Dulera as well as Umeclidinium; continue Singulair  CKD 3B -Used to be on indapamide in the past.   --Creatinine stable with GFR less than 60 and with resumption of meloxicam creatinine stable at 1.38 with previous reading 1.25   Generalized deconditioning Homelessness -PT/OT initially recommended SNF.  History recent therapy sessions demonstrate increased independence with mobility when utilizing rolling walker. -Primary barriers to ambulation are patient's motivation and ongoing back pain which is being treated as above as well as her known thoracic and lumbar disc herniation causing chronic pain which is compounded by her morbid obesity  Thoracic and lumbar spine disc herniation -Has difficulty with mobility and chronic pain secondary to disc herniation which is significantly compounded by patient's profound morbid obesity -Because of chronic pain from this condition patient has not been able to obtain appropriate employment as the jobs she is qualified for require her to walk, stand or lift which she is unable to do  Cystic acne -Location on back and shoulder -Has completed 16 doses of doxycycline   Yeast panniculitis -Significantly improved. -Continue nystatin powder to all of intertriginous areas (TID) -Instructions given for staff to wash all intertriginous areas with water and pat dry before applying a light dusting of the nystatin powder   Morbid obesity  -Body mass index is 76.53 kg/m. Patient has been advised to make an attempt to  improve diet and exercise patterns to aid in weight loss.   Generalized anxiety disorder/restless legs -Continue Wellbutrin, BuSpar, Neurontin, Requip.      Data Reviewed: Basic Metabolic Panel: Recent Labs  Lab 12/19/20 0501 12/21/20 0338  NA  --  138  K  --  3.9  CL  --  109  CO2  --  21*  GLUCOSE  --  88  BUN  --  26*  CREATININE 1.30* 1.38*  CALCIUM  --  9.1    Scheduled Meds:  ascorbic acid  500 mg Oral Daily   bacitracin   Topical BID   buPROPion ER  200 mg Oral BID   clotrimazole   Topical BID   diclofenac Sodium  4 g Topical QID   enoxaparin (LOVENOX) injection  100 mg Subcutaneous Q24H   fluticasone  2 spray Each Nare BID   methocarbamol  500 mg Oral QID   mometasone-formoterol  2 puff Inhalation BID   montelukast  10 mg Oral QHS   nystatin   Topical TID   pantoprazole  40 mg Oral Daily   pregabalin  100 mg Oral TID   rOPINIRole  4 mg Oral QHS   senna-docusate  2 tablet Oral Daily   topiramate  25 mg Oral BID   traZODone  50 mg Oral QHS   umeclidinium bromide  1 puff Inhalation Daily   Continuous Infusions:    Principal Problem:   Complicated UTI (urinary tract infection)  Active Problems:   Asthma   PALPITATIONS, OCCASIONAL   Panniculitis   OSA (obstructive sleep apnea)   Major depressive disorder, recurrent episode, moderate (HCC)   Generalized anxiety disorder   Major depression, chronic   Thrombocytosis   Hypokalemia   CKD (chronic kidney disease), stage III ([place])   Consultants: Psychiatry  Procedures: None  Antibiotics: Fosfomycin x1 on 7/19, Macrodantin 7/21 and 7/22 Ertapenem x1 on 7/26 Cefepime 11/02/2027   Time spent: 25 minutes    Erin Hearing ANP  [facility] 7 am - 330 pm/M-F for direct patient care and secure chat Please refer to Amion for contact info 54  days

## 2020-12-25 NOTE — Plan of Care (Signed)
  Problem: Health Behavior/Discharge Planning: Goal: Ability to manage health-related needs will improve Outcome: Progressing   Problem: Activity: Goal: Risk for activity intolerance will decrease Outcome: Progressing   Problem: Skin Integrity: Goal: Risk for impaired skin integrity will decrease Outcome: Progressing   

## 2020-12-26 DIAGNOSIS — R52 Pain, unspecified: Secondary | ICD-10-CM

## 2020-12-26 LAB — CREATININE, SERUM
Creatinine, Ser: 1.29 mg/dL — ABNORMAL HIGH (ref 0.44–1.00)
GFR, Estimated: 53 mL/min — ABNORMAL LOW (ref >60)

## 2020-12-26 NOTE — Progress Notes (Signed)
Occupational Therapy Treatment Patient Details Name: Heather [name] MRN: [id number] DOB: 12-04-1975 Today's Date: 12/26/2020   History of present illness 45 y.o. female presented to ED for chest pain and palpitations and feeling like her throat was closing while at her behavioral health facility for SI. Admitted 10/31/20 for treatment of complicated UTI PMH: anxiety and depression (hx of bipolar and schizoaffective disorder in chart in past), ACD, panniculitis, debility,GERD,  OSA not on cpap, chronic back pain.   OT comments  Patient seen by skilled OT to increase independence and safety with self care and functional transfers.  Patient continues to make good progress with OT treatment.  Patient able to perform more tasks with AE and extra time. Patient to continue to be seen by OT and will focus on toilet hygiene with toilet tongs.     Recommendations for follow up therapy are one component of a multi-disciplinary discharge planning process, led by the attending physician.  Recommendations may be updated based on patient status, additional functional criteria and insurance authorization.    Follow Up Recommendations  Other (comment)    Equipment Recommendations  3 in 1 bedside commode;Tub/shower bench;Other (comment)    Recommendations for Other Services      Precautions / Restrictions Precautions Precautions: Fall Precaution Comments: fell prior to hosp       Mobility Bed Mobility Overal bed mobility: Needs Assistance Bed Mobility: Supine to Sit Rolling: Supervision   Supine to sit: Supervision     General bed mobility comments: uses rails to assist    Transfers Overall transfer level: Needs assistance Equipment used: Rolling walker (2 wheeled) Transfers: Sit to/from Stand Sit to Stand: Min guard Stand pivot transfers: Supervision       General transfer comment: min guard for shower transfers for safety    Balance Overall balance assessment: Needs  assistance Sitting-balance support: No upper extremity supported;Feet supported Sitting balance-Leahy Scale: Good Sitting balance - Comments: demonstrates good sitting balance   Standing balance support: No upper extremity supported;During functional activity Standing balance-Leahy Scale: Fair Standing balance comment: able to stand without AD while performing toileting and drying self while standing                           ADL either performed or assessed with clinical judgement   ADL Overall ADL's : Needs assistance/impaired     Grooming: Brushing hair;Sitting Grooming Details (indicate cue type and reason): setup only Upper Body Bathing: Set up;Sitting Upper Body Bathing Details (indicate cue type and reason): UB bathing in shower with LH sponge for back Lower Body Bathing: Supervison/ safety Lower Body Bathing Details (indicate cue type and reason): LB bathing in shower with LH sponge Upper Body Dressing : Set up;Sitting Upper Body Dressing Details (indicate cue type and reason): Donned overhead gown Lower Body Dressing: Set up Lower Body Dressing Details (indicate cue type and reason): able to donn socks with sock aide and setup Toilet Transfer: Supervision/safety;RW Toilet Transfer Details (indicate cue type and reason): Bariatric BSC over toilet in bathroom Toileting- Clothing Manipulation and Hygiene: Minimal assistance Toileting - Clothing Manipulation Details (indicate cue type and reason): for toilet hygiene after BM patient wanted to try without toilet tongs Tub/ Shower Transfer: Walk-in shower;Min guard;Cueing for safety;3 in 1 Tub/Shower Transfer Details (indicate cue type and reason): min guard for walk in shower with small ledge to step over Functional mobility during ADLs: Rolling walker;Supervision/safety General ADL Comments: needs to improve with toilt  hygiene     Vision       Perception     Praxis      Cognition Arousal/Alertness:  Awake/alert Behavior During Therapy: WFL for tasks assessed/performed Overall Cognitive Status: Within Functional Limits for tasks assessed Area of Impairment: Safety/judgement;Problem solving                   Current Attention Level: Alternating     Safety/Judgement: Decreased awareness of safety Awareness: Intellectual Problem Solving: Requires verbal cues General Comments: cues for safety        Exercises     Shoulder Instructions       General Comments      Pertinent Vitals/ Pain       Pain Assessment: Faces Faces Pain Scale: Hurts a little bit Pain Location: LBP and R knee Pain Descriptors / Indicators: Grimacing;Aching Pain Intervention(s): Repositioned;Monitored during session  Home Living                                          Prior Functioning/Environment              Frequency  Min 2X/week        Progress Toward Goals  OT Goals(current goals can now be found in the care plan section)  Progress towards OT goals: Progressing toward goals  Acute Rehab OT Goals Patient Stated Goal: to reduce pain OT Goal Formulation: With patient Time For Goal Achievement: 01/05/21 Potential to Achieve Goals: Good ADL Goals Pt Will Perform Grooming: with modified independence;standing Pt Will Perform Lower Body Bathing: with modified independence;sitting/lateral leans;sit to/from stand;with adaptive equipment Pt Will Perform Lower Body Dressing: with modified independence;with adaptive equipment;sitting/lateral leans;sit to/from stand Pt Will Transfer to Toilet: with modified independence;bedside commode;ambulating Pt Will Perform Toileting - Clothing Manipulation and hygiene: with modified independence;with adaptive equipment;sitting/lateral leans;sit to/from stand Pt Will Perform Tub/Shower Transfer: with modified independence;rolling walker  Plan Discharge plan remains appropriate    Co-evaluation                 AM-PAC  OT "6 Clicks" Daily Activity     Outcome Measure   Help from another person eating meals?: None Help from another person taking care of personal grooming?: A Little Help from another person toileting, which includes using toliet, bedpan, or urinal?: A Little Help from another person bathing (including washing, rinsing, drying)?: A Little Help from another person to put on and taking off regular upper body clothing?: A Little Help from another person to put on and taking off regular lower body clothing?: A Little 6 Click Score: 19    End of Session Equipment Utilized During Treatment: Rolling walker  OT Visit Diagnosis: Unsteadiness on feet (R26.81);Other abnormalities of gait and mobility (R26.89);Muscle weakness (generalized) (M62.81);Pain;Other (comment) Pain - Right/Left: Right Pain - part of body: Hip;Knee   Activity Tolerance Patient tolerated treatment well   Patient Left in chair;with call bell/phone within reach   Nurse Communication Mobility status        Time: 6063-0160 OT Time Calculation (min): 37 min  Charges: OT General Charges $OT Visit: 1 Visit OT Treatments $Self Care/Home Management : 23-37 mins  Lodema Hong, OTA   Kyndel Egger Alexis Goodell 12/26/2020, 9:50 AM

## 2020-12-26 NOTE — Care Plan (Signed)
Patient seen and examined this morning She is resting comfortably No new complaints working with a laptop and phone.    Issues: UTI/Enterobacter colonization/Nephrolithiasis-non obstructive/Renal cyst- no fever, treated SI/GAD/Depression/Insomnia-stable mood, no more suicidal Possible OSA/OHS- sleep eval as OP.  Currently doing well on room air Cystic acne-hyper much better., treated w. doxy RLS-continue her Requip CKDIIIb-monitor renal fun intermittently.  Creat stable at 1.26  Chronic pain- hip pain, back pain, disc herniation-cont pain control PT OT ambulation supportive care. Yeast panniculitis- treated, nystatin. Leg edema-monitor- prn lasix. Morbid obesity w/ BMI 75- wt loss, pcp fu. Deconditioning -PTOT DTP- TOC involved.  Looking into long-term Medicaid placement currently no payer source no insurance.  If unable to find placement she may have to be discharged to previous living arrangement/homeless shelter.

## 2020-12-26 NOTE — Progress Notes (Addendum)
1:30pm: CSW attempted to reach Tharon Aquas, intake coordinator at [facility] without success - a voicemail was left requesting a return call.  11:50am: CSW attempted to reach intake coordinator at [facility] - however she is in a board meeting so CSW was advised to return call at 1pm.  10:30am: CSW spoke with patient at bedside to discuss discharge planning. Patient was sitting upright in the chair on the telephone, typing on her laptop upon CSW arrival. Patient reports she has a disability hearing scheduled for Thursday 12/28/20 - patient reports she was previously receiving disability up until mid 2020 when payments were stopped. Patient reports she was residing in a hotel that was sponsored by a church prior to admission. Patient reports the church ran out of funds and they are no longer able to provide her with housing assistance. CSW explained barriers to placement at this time including lack of income and Medicaid only. Patient reports she has no where to go at discharge with no support from family or friends and states her female friend that visits her is homeless. CSW confirmed the walker at bedside belonged to the patient.   CSW spoke with MD regarding patient - CSW informed MD of barriers to obtaining placement for patient. MD to obtain new labs for patient prior to discharge, likely tomorrow 9/21/222.  CSW added housing resources to patient's AVS - CSW will contact [facility] to determine bed availability.  [name], MSW, LCSW Transitions of Care  Clinical Social Worker II [phone]

## 2020-12-27 ENCOUNTER — Other Ambulatory Visit (HOSPITAL_COMMUNITY): Payer: Self-pay

## 2020-12-27 DIAGNOSIS — N390 Urinary tract infection, site not specified: Secondary | ICD-10-CM | POA: Diagnosis not present

## 2020-12-27 LAB — CBC
HCT: 35.4 % — ABNORMAL LOW (ref 36.0–46.0)
Hemoglobin: 10.4 g/dL — ABNORMAL LOW (ref 12.0–15.0)
MCH: 24.8 pg — ABNORMAL LOW (ref 26.0–34.0)
MCHC: 29.4 g/dL — ABNORMAL LOW (ref 30.0–36.0)
MCV: 84.3 fL (ref 80.0–100.0)
Platelets: 423 10*3/uL — ABNORMAL HIGH (ref 150–400)
RBC: 4.2 MIL/uL (ref 3.87–5.11)
RDW: 17.2 % — ABNORMAL HIGH (ref 11.5–15.5)
WBC: 13.9 10*3/uL — ABNORMAL HIGH (ref 4.0–10.5)
nRBC: 0 % (ref 0.0–0.2)

## 2020-12-27 LAB — BASIC METABOLIC PANEL
Anion gap: 10 (ref 5–15)
BUN: 22 mg/dL — ABNORMAL HIGH (ref 6–20)
CO2: 22 mmol/L (ref 22–32)
Calcium: 9.2 mg/dL (ref 8.9–10.3)
Chloride: 105 mmol/L (ref 98–111)
Creatinine, Ser: 1.3 mg/dL — ABNORMAL HIGH (ref 0.44–1.00)
GFR, Estimated: 53 mL/min — ABNORMAL LOW (ref >60)
Glucose, Bld: 85 mg/dL (ref 70–99)
Potassium: 4 mmol/L (ref 3.5–5.1)
Sodium: 137 mmol/L (ref 135–145)

## 2020-12-27 MED ORDER — MOMETASONE FURO-FORMOTEROL FUM 100-5 MCG/ACT IN AERO
2.0000 | INHALATION_SPRAY | Freq: Two times a day (BID) | RESPIRATORY_TRACT | 2 refills | Status: DC
  Filled 2020-12-27: qty 13, 30d supply, fill #0

## 2020-12-27 MED ORDER — NYSTATIN 100000 UNIT/GM EX POWD
Freq: Three times a day (TID) | CUTANEOUS | 0 refills | Status: AC
  Filled 2020-12-27: qty 15, 5d supply, fill #0

## 2020-12-27 MED ORDER — VITAMIN B-12 1000 MCG PO TABS
1000.0000 ug | ORAL_TABLET | Freq: Every day | ORAL | 2 refills | Status: DC
  Filled 2020-12-27: qty 30, 30d supply, fill #0
  Filled 2021-06-27: qty 30, 30d supply, fill #1

## 2020-12-27 MED ORDER — SENNOSIDES-DOCUSATE SODIUM 8.6-50 MG PO TABS
2.0000 | ORAL_TABLET | Freq: Every day | ORAL | 0 refills | Status: DC
  Filled 2020-12-27: qty 60, 30d supply, fill #0

## 2020-12-27 MED ORDER — UMECLIDINIUM BROMIDE 62.5 MCG/INH IN AEPB
1.0000 | INHALATION_SPRAY | Freq: Every day | RESPIRATORY_TRACT | 2 refills | Status: DC
Start: 2020-12-28 — End: 2020-12-27
  Filled 2020-12-27: qty 30, 30d supply, fill #0

## 2020-12-27 MED ORDER — CLONAZEPAM 0.5 MG PO TABS
0.2500 mg | ORAL_TABLET | Freq: Two times a day (BID) | ORAL | 0 refills | Status: DC | PRN
  Filled 2020-12-27: qty 30, 30d supply, fill #0

## 2020-12-27 MED ORDER — FLUTICASONE PROPIONATE 50 MCG/ACT NA SUSP
2.0000 | Freq: Two times a day (BID) | NASAL | 2 refills | Status: AC
  Filled 2020-12-27: qty 16, 30d supply, fill #0
  Filled 2021-06-27: qty 16, 30d supply, fill #1

## 2020-12-27 MED ORDER — ALBUTEROL SULFATE HFA 108 (90 BASE) MCG/ACT IN AERS
2.0000 | INHALATION_SPRAY | RESPIRATORY_TRACT | 1 refills | Status: DC | PRN
  Filled 2020-12-27: qty 8.5, 18d supply, fill #0

## 2020-12-27 MED ORDER — BUPROPION HCL ER (SR) 100 MG PO TB12
100.0000 mg | ORAL_TABLET | Freq: Two times a day (BID) | ORAL | 2 refills | Status: DC
  Filled 2020-12-27: qty 60, 30d supply, fill #0

## 2020-12-27 MED ORDER — ALBUTEROL SULFATE (2.5 MG/3ML) 0.083% IN NEBU: 2.5000 mg | INHALATION_SOLUTION | RESPIRATORY_TRACT | Status: DC | PRN

## 2020-12-27 MED ORDER — DICLOFENAC SODIUM 1 % EX GEL
4.0000 g | Freq: Four times a day (QID) | CUTANEOUS | 2 refills | Status: DC
  Filled 2020-12-27: qty 100, 10d supply, fill #0
  Filled 2021-06-27: qty 100, 10d supply, fill #1

## 2020-12-27 MED ORDER — ROPINIROLE HCL 4 MG PO TABS
4.0000 mg | ORAL_TABLET | Freq: Every day | ORAL | 2 refills | Status: DC
  Filled 2020-12-27: qty 30, 30d supply, fill #0
  Filled 2021-06-27: qty 30, 30d supply, fill #1

## 2020-12-27 MED ORDER — OXYCODONE HCL 5 MG PO TABS
5.0000 mg | ORAL_TABLET | ORAL | 0 refills | Status: DC | PRN
  Filled 2020-12-27: qty 30, 5d supply, fill #0

## 2020-12-27 MED ORDER — PREGABALIN 100 MG PO CAPS
100.0000 mg | ORAL_CAPSULE | Freq: Three times a day (TID) | ORAL | 2 refills | Status: DC
  Filled 2020-12-27: qty 90, 30d supply, fill #0
  Filled 2021-06-27: qty 90, 30d supply, fill #1

## 2020-12-27 MED ORDER — TOPIRAMATE 25 MG PO TABS
25.0000 mg | ORAL_TABLET | Freq: Two times a day (BID) | ORAL | 2 refills | Status: DC
  Filled 2020-12-27: qty 60, 30d supply, fill #0

## 2020-12-27 MED ORDER — METHOCARBAMOL 500 MG PO TABS
500.0000 mg | ORAL_TABLET | Freq: Four times a day (QID) | ORAL | 2 refills | Status: DC
  Filled 2020-12-27: qty 120, 30d supply, fill #0
  Filled 2021-06-27: qty 120, 30d supply, fill #1

## 2020-12-27 MED ORDER — ASCORBIC ACID 500 MG PO TABS
500.0000 mg | ORAL_TABLET | Freq: Every day | ORAL | 2 refills | Status: DC
  Filled 2020-12-27: qty 30, 30d supply, fill #0
  Filled 2021-06-27: qty 30, 30d supply, fill #1

## 2020-12-27 MED ORDER — TRAZODONE HCL 50 MG PO TABS
50.0000 mg | ORAL_TABLET | Freq: Every day | ORAL | 2 refills | Status: DC
  Filled 2020-12-27: qty 30, 30d supply, fill #0

## 2020-12-27 NOTE — Progress Notes (Addendum)
CSW spoke with Network engineer at [facility] who states there are no beds available at this time.  CSW attempted to reach staff at [facility] without success - a voicemail was left requesting a return call.  CSW spoke with Network engineer at [facility] who states prior to coming to the shelter, individuals must complete a phone interview and in person interview. There are no interviews appointments available today.  CSW spoke with Network engineer at [facility] who states the facility only accommodates women who have children in their custody.  CSW spoke with Cat at [facility] who states there are two beds available - CSW provided Cat with patient's name and phone number so staff can contact patient.  [name], MSW, LCSW Transitions of Care  Clinical Social Worker II [phone]

## 2020-12-27 NOTE — Progress Notes (Signed)
Physical Therapy Treatment Patient Details Name: Heather [name] MRN: [id number] DOB: Dec 14, 1975 Today's Date: 12/27/2020   History of Present Illness 45 y.o. female presented to ED for chest pain and palpitations and feeling like her throat was closing while at her behavioral health facility for SI. Admitted 10/31/20 for treatment of complicated UTI PMH: anxiety and depression (hx of bipolar and schizoaffective disorder in chart in past), ACD, panniculitis, debility,GERD,  OSA not on cpap, chronic back pain.    PT Comments    Pt is making good progress towards her goals today. Focus of session, purposeful standing tasks. Unit AD ask pt to place tags on bulleting board which had her standing 2 bouts of 8 min with reaching outside her BoS. Pt also able to ambulate 15 feet across open floor without AD. Continues to need d/c plan that includes at least some support for iADLs. PT will continue to see acutely.     Recommendations for follow up therapy are one component of a multi-disciplinary discharge planning process, led by the attending physician.  Recommendations may be updated based on patient status, additional functional criteria and insurance authorization.  Follow Up Recommendations  Other (comment);Supervision - Intermittent (ALF)     Equipment Recommendations  None recommended by PT (owns bariatric RW)    Recommendations for Other Services       Precautions / Restrictions Precautions Precautions: Fall Precaution Comments: fell prior to hosp Restrictions Weight Bearing Restrictions: No     Mobility  Bed Mobility                    Transfers Overall transfer level: Needs assistance Equipment used: None Transfers: Sit to/from Stand Sit to Stand: Min guard         General transfer comment: min guard for power up from recliner x3 during session  Ambulation/Gait Ambulation/Gait assistance: Min guard Gait Distance (Feet): 40 Feet (x2) Assistive device:  Rolling walker (2 wheeled);None (bariatric RW, R sided hand rail in hallway) Gait Pattern/deviations: Step-through pattern;Trunk flexed Gait velocity: within safe limits Gait velocity interpretation: 1.31 - 2.62 ft/sec, indicative of limited community ambulator General Gait Details: min guard for safety, pt wanted to trial ambulation without RW today and was able to walk 15 feet across open space with steady gait, pt continues to increase gait velocity when ambulating without AD, vc for slowing pace         Balance Overall balance assessment: Needs assistance Sitting-balance support: No upper extremity supported;Feet supported Sitting balance-Leahy Scale: Good     Standing balance support: No upper extremity supported Standing balance-Leahy Scale: Fair Standing balance comment: able to stand ~5 min while talking to SW, 3 of which were without UE support             High level balance activites: Other (comment) High Level Balance Comments: Pt with 2x bouts of standing reaching out to place tags on wall chart, some outside BoS            Cognition Arousal/Alertness: Awake/alert Behavior During Therapy: WFL for tasks assessed/performed Overall Cognitive Status: Within Functional Limits for tasks assessed Area of Impairment: Safety/judgement;Problem solving                   Current Attention Level: Alternating     Safety/Judgement: Decreased awareness of safety Awareness: Intellectual Problem Solving: Requires verbal cues General Comments: pt with decreased awareness of level of function she will need to be able to safely take care of herself  at discharge.      Exercises Other Exercises Other Exercises: wiping down counters with single UE support    General Comments General comments (skin integrity, edema, etc.): VSS on RA      Pertinent Vitals/Pain Pain Assessment: Faces Faces Pain Scale: Hurts even more Pain Location: R knee after prolonged  standing Pain Descriptors / Indicators: Grimacing;Aching Pain Intervention(s): Patient requesting pain meds-RN notified;Limited activity within patient's tolerance;Monitored during session     PT Goals (current goals can now be found in the care plan section) Acute Rehab PT Goals Patient Stated Goal: to reduce pain PT Goal Formulation: With patient Time For Goal Achievement: 12/12/20 Potential to Achieve Goals: Fair    Frequency    Min 3X/week      PT Plan Current plan remains appropriate       AM-PAC PT "6 Clicks" Mobility   Outcome Measure  Help needed turning from your back to your side while in a flat bed without using bedrails?: A Little Help needed moving from lying on your back to sitting on the side of a flat bed without using bedrails?: A Little Help needed moving to and from a bed to a chair (including a wheelchair)?: A Little Help needed standing up from a chair using your arms (e.g., wheelchair or bedside chair)?: A Little Help needed to walk in hospital room?: A Little Help needed climbing 3-5 steps with a railing? : A Lot 6 Click Score: 17    End of Session   Activity Tolerance: Patient tolerated treatment well Patient left: with call bell/phone within reach;in chair Nurse Communication: Mobility status PT Visit Diagnosis: Unsteadiness on feet (R26.81);Muscle weakness (generalized) (M62.81);History of falling (Z91.81) Pain - Right/Left: Right Pain - part of body: Hip (low back)     Time: 3419-6222 PT Time Calculation (min) (ACUTE ONLY): 40 min  Charges:  $Gait Training: 8-22 mins $Therapeutic Activity: 23-37 mins                     Barri Neidlinger B. Migdalia Dk PT, DPT Acute Rehabilitation Services Pager [phone] Office [phone]    [place] 12/27/2020, 3:28 PM

## 2020-12-27 NOTE — Progress Notes (Signed)
[facility] PROGRESS NOTE  Heather [name] SKA:[id number] DOB: 1975-12-11 DOA: 10/30/2020 PCP: Lucianne Lei, MD  Status: Remains inpatient appropriate because:Unsafe d/c plan-patient able to mobilize with a walker 300 feet but is morbidly obese and is unable to navigate stairs given her obesity, severe bilateral knee osteoarthritis, multiple spinal disc herniations and requirement for walker for mobility.  In addition given all of these issues camping out in a tent with sleeping back provided for patient by [facility] is not appropriate either since she is unable to get up and down off of a flat surface independently.  Dispo:  Patient From:  Homeless-newly homeless since May 2022  Planned Disposition: ALF  Medically stable for discharge: Yes  Barriers to discharge: Morbid obesity prohibits safe discharge to homeless shelter given decreased mobility and sheer size makes it difficult to find a homeless shelter    Level of care: Med-Surg  Code Status: Full Family Communication: Patient only DVT prophylaxis: Lovenox COVID vaccination status: Moderna x1 on 11/06/2020  and Pfizer x1 on 08/28/2020    HPI: 45 y.o. female with PMH significant for morbid obesity, DM2, obstructive sleep apnea, COPD, MDD, GAD, insomnia and RLS,renal mass,  and numerous small wounds under her pannus, upper thighs and between her legs.   Patient presented to the ED on 7/25 presented with right flank and suprapubic pain along with nausea and vomiting.   She was admitted and treated for complicated UTI with antibiotics and has completed treatment.  Currently medically stable for discharge to SNF.  Subjective: Alert.  Sitting in bed looking on phone.  States she has an appointment to speak with disability on Thursday 9/22.  Very excited that she was able to mobilize 300 feet with a walker while working with therapy this weekend.  Continues to express desire to improve mobility and is hopeful can 1 day live independently  so she can regain custody of her children.   Objective: Vitals:   12/27/20 0749 12/27/20 0814  BP: (!) 108/57   Pulse: 72   Resp: 16   Temp: 98 F (36.7 C)   SpO2: 100% 96%    Intake/Output Summary (Last 24 hours) at 12/27/2020 1301 Last data filed at 12/27/2020 0755 Gross per 24 hour  Intake 960 ml  Output 0 ml  Net 960 ml   Filed Weights   12/23/20 0509 12/25/20 2139 12/26/20 2023  Weight: (!) 194.5 kg (!) 197.8 kg (!) 195.2 kg    Exam:  Constitutional: NAD, calm Respiratory: CTA, RA, wheezing or increased work of breathing. Cardiovascular: S1-S2, normotensive, regular pulse, no peripheral edema Abdomen: Soft and nontender.  Obese.  Bowel sounds positive. LBM 9/19 Neurologic: CN 2-12 grossly intact. Sensation intact, Strength 4/5 x all 4 extremities.  Psychiatric: Normal judgment and insight. Alert and oriented x 3.    Assessment/Plan: Acute problems: Complicated UTI presumed 2/2 Enterobacter colonization -Patient completed 3 days of cefepime, urine culture colonized,   Nonobstructing nephrolithiasis Renal cyst -Imaging this admission revealed bilateral nonobstructing renal calculi with a staghorn calculus on the left.  There was also noted to be a 8.3 cm benign-appearing right upper pole cyst.  Recommendation is to follow-up with urology as an outpatient.  Suicidal ideation/anxiety and depression -Psychiatry has cleared the patient for discharge.  OP FU rec -Continue Wellbutrin, trazodone, and prn clonazepam  Suspected OSA/OHS -Patient with O2 saturations between 90 and 94% while sleeping -Recommend outpatient sleep study  Right hip/back/leg pain and patient with known disc herniation on imaging -Back pain (  chronic).  Continue outpatient pain medications (Topamax, meloxicam and Robaxin).  -Continue Lyrica and Oxy IR  Asthma -Continue DuoNebs as needed -Started on long-acting agents this admission: Dulera as well as Umeclidinium (nonformulary for Medicaid  and requires prior approval); continue Singulair  CKD 3B -Used to be on indapamide in the past.   --Creatinine stable with GFR less than 60 and with resumption of meloxicam creatinine stable at 1.38 with previous reading 1.25   Generalized deconditioning Homelessness -PT/OT initially recommended SNF.  History recent therapy sessions demonstrate increased independence with mobility when utilizing rolling walker. -Primary barriers to ambulation are patient's motivation and ongoing back pain which is being treated as above as well as her known thoracic and lumbar disc herniation causing chronic pain which is compounded by her morbid obesity -Disability has been applied for  Thoracic and lumbar spine disc herniation -Has difficulty with mobility and chronic pain secondary to disc herniation which is significantly compounded by patient's profound morbid obesity -Because of chronic pain from this condition patient has not been able to obtain appropriate employment as the jobs she is qualified for require her to walk, stand or lift which she is unable to do  Cystic acne -Resolved and has completed a course of doxycycline    Yeast panniculitis -Continue topical antifungal   Morbid obesity  -Body mass index is 76.53 kg/m. Patient has been advised to make an attempt to improve diet and exercise patterns to aid in weight loss.   Generalized anxiety disorder/restless legs -Continue Wellbutrin, BuSpar, Neurontin, Requip.      Data Reviewed: Basic Metabolic Panel: Recent Labs  Lab 12/21/20 0338 12/26/20 0505 12/27/20 0357  NA 138  --  137  K 3.9  --  4.0  CL 109  --  105  CO2 21*  --  22  GLUCOSE 88  --  85  BUN 26*  --  22*  CREATININE 1.38* 1.29* 1.30*  CALCIUM 9.1  --  9.2    Scheduled Meds:  ascorbic acid  500 mg Oral Daily   buPROPion ER  200 mg Oral BID   clotrimazole   Topical BID   diclofenac Sodium  4 g Topical QID   enoxaparin (LOVENOX) injection  100 mg  Subcutaneous Q24H   fluticasone  2 spray Each Nare BID   methocarbamol  500 mg Oral QID   mometasone-formoterol  2 puff Inhalation BID   montelukast  10 mg Oral QHS   nystatin   Topical TID   pantoprazole  40 mg Oral Daily   pregabalin  100 mg Oral TID   rOPINIRole  4 mg Oral QHS   senna-docusate  2 tablet Oral Daily   topiramate  25 mg Oral BID   traZODone  50 mg Oral QHS   umeclidinium bromide  1 puff Inhalation Daily   Continuous Infusions:    Principal Problem:   Complicated UTI (urinary tract infection) Active Problems:   Asthma   PALPITATIONS, OCCASIONAL   Panniculitis   OSA (obstructive sleep apnea)   Major depressive disorder, recurrent episode, moderate (HCC)   Generalized anxiety disorder   Major depression, chronic   Thrombocytosis   Hypokalemia   CKD (chronic kidney disease), stage III ([place])   Consultants: Psychiatry  Procedures: None  Antibiotics: Fosfomycin x1 on 7/19, Macrodantin 7/21 and 7/22 Ertapenem x1 on 7/26 Cefepime 11/02/2027   Time spent: 25 minutes    Erin Hearing ANP  [facility] 7 am - 330 pm/M-F for direct patient care and  secure chat Please refer to Amion for contact info 56  days

## 2020-12-27 NOTE — Discharge Instructions (Signed)
[facility] [address] [place], [place] [phone]

## 2020-12-28 DIAGNOSIS — N390 Urinary tract infection, site not specified: Secondary | ICD-10-CM | POA: Diagnosis not present

## 2020-12-28 LAB — URINALYSIS, ROUTINE W REFLEX MICROSCOPIC
Bilirubin Urine: NEGATIVE
Glucose, UA: NEGATIVE mg/dL
Ketones, ur: NEGATIVE mg/dL
Nitrite: POSITIVE — AB
Protein, ur: NEGATIVE mg/dL
Specific Gravity, Urine: 1.02 (ref 1.005–1.030)
pH: 6 (ref 5.0–8.0)

## 2020-12-28 LAB — URINALYSIS, MICROSCOPIC (REFLEX): WBC, UA: >50 WBC/hpf (ref 0–5)

## 2020-12-28 NOTE — Progress Notes (Signed)
[facility] PROGRESS NOTE  Heather [name] IWL:[id number] DOB: 1975/10/19 DOA: 10/30/2020 PCP: Lucianne Lei, MD  Status: Remains inpatient appropriate because:Unsafe d/c plan-patient able to mobilize with a walker 300 feet but is morbidly obese and is unable to navigate stairs given her obesity, severe bilateral knee osteoarthritis, multiple spinal disc herniations and requirement for walker for mobility.  In addition given all of these issues camping out in a tent with sleeping back provided for patient by [facility] is not appropriate either since she is unable to get up and down off of a flat surface independently.  Dispo:  Patient From:  Homeless-newly homeless since May 2022  Planned Disposition: ALF  Medically stable for discharge: Yes  Barriers to discharge: Morbid obesity prohibits safe discharge to homeless shelter given decreased mobility and sheer size makes it difficult to find a homeless shelter    Level of care: Med-Surg  Code Status: Full Family Communication: Patient only DVT prophylaxis: Lovenox COVID vaccination status: Moderna x1 on 11/06/2020  and Pfizer x1 on 08/28/2020    HPI: 45 y.o. female with PMH significant for morbid obesity, DM2, obstructive sleep apnea, COPD, MDD, GAD, insomnia and RLS,renal mass,  and numerous small wounds under her pannus, upper thighs and between her legs.   Patient presented to the ED on 7/25 presented with right flank and suprapubic pain along with nausea and vomiting.   She was admitted and treated for complicated UTI with antibiotics and has completed treatment.  Currently medically stable for discharge to SNF.  Subjective: Alert.  Complaining of right leg and low back pain after quickly dropping into chair after ambulating.  Also complaining of dysuria and some suprapubic discomfort   Objective: Vitals:   12/28/20 0746 12/28/20 0747  BP:    Pulse:    Resp:    Temp:    SpO2: 96% 98%    Intake/Output Summary (Last 24  hours) at 12/28/2020 1740 Last data filed at 12/27/2020 2031 Gross per 24 hour  Intake 860 ml  Output --  Net 860 ml   Filed Weights   12/25/20 2139 12/26/20 2023 12/27/20 2023  Weight: (!) 197.8 kg (!) 195.2 kg (!) 197.1 kg    Exam:  Constitutional: NAD, calm Respiratory: CTA, RA, wheezing or increased work of breathing. Cardiovascular: S1-S2, normotensive, regular pulse, no peripheral edema Abdomen: Soft and nontender.  Obese.  Bowel sounds positive. LBM 9/19 Neurologic: CN 2-12 grossly intact. Sensation intact, Strength 4/5 x all 4 extremities.  Psychiatric: Normal judgment and insight. Alert and oriented x 3.    Assessment/Plan: Acute problems: Complicated UTI presumed 2/2 Enterobacter colonization -Patient completed 3 days of cefepime, urine culture colonized,  Leukocytosis without fever -Given her complaints of back pain, dysuria and suprapubic discomfort will obtain catheterized urinalysis and culture -No indication at this juncture to initiate empiric antibiotic -Repeat CBC in a.m.   Nonobstructing nephrolithiasis Renal cyst -Imaging this admission revealed bilateral nonobstructing renal calculi with a staghorn calculus on the left.  There was also noted to be a 8.3 cm benign-appearing right upper pole cyst.  Recommendation is to follow-up with urology as an outpatient.  Suicidal ideation/anxiety and depression -Psychiatry has cleared the patient for discharge.  OP FU rec -Continue Wellbutrin, trazodone, and prn clonazepam  Suspected OSA/OHS -Patient with O2 saturations between 90 and 94% while sleeping -Recommend outpatient sleep study  Right hip/back/leg pain and patient with known disc herniation on imaging -Back pain (chronic).  Continue outpatient pain medications (Topamax, meloxicam and Robaxin).  -  Continue Lyrica and Oxy IR  Asthma -Continue DuoNebs as needed -Started on long-acting agents this admission: Dulera as well as Umeclidinium (nonformulary for  Medicaid and requires prior approval); continue Singulair  CKD 3B -Used to be on indapamide in the past.   --Creatinine stable with GFR less than 60 and with resumption of meloxicam creatinine stable at 1.38 with previous reading 1.25   Generalized deconditioning Homelessness -PT/OT initially recommended SNF.  History recent therapy sessions demonstrate increased independence with mobility when utilizing rolling walker. -Primary barriers to ambulation are patient's motivation and ongoing back pain which is being treated as above as well as her known thoracic and lumbar disc herniation causing chronic pain which is compounded by her morbid obesity -Disability has been applied for  Thoracic and lumbar spine disc herniation -Has difficulty with mobility and chronic pain secondary to disc herniation which is significantly compounded by patient's profound morbid obesity -Because of chronic pain from this condition patient has not been able to obtain appropriate employment as the jobs she is qualified for require her to walk, stand or lift which she is unable to do  Cystic acne -Resolved and has completed a course of doxycycline    Yeast panniculitis -Continue topical antifungal   Morbid obesity  -Body mass index is 76.53 kg/m. Patient has been advised to make an attempt to improve diet and exercise patterns to aid in weight loss.   Generalized anxiety disorder/restless legs -Continue Wellbutrin, BuSpar, Neurontin, Requip.      Data Reviewed: Basic Metabolic Panel: Recent Labs  Lab 12/26/20 0505 12/27/20 0357  NA  --  137  K  --  4.0  CL  --  105  CO2  --  22  GLUCOSE  --  85  BUN  --  22*  CREATININE 1.29* 1.30*  CALCIUM  --  9.2    Scheduled Meds:  ascorbic acid  500 mg Oral Daily   buPROPion ER  200 mg Oral BID   clotrimazole   Topical BID   diclofenac Sodium  4 g Topical QID   enoxaparin (LOVENOX) injection  100 mg Subcutaneous Q24H   fluticasone  2 spray Each  Nare BID   methocarbamol  500 mg Oral QID   mometasone-formoterol  2 puff Inhalation BID   montelukast  10 mg Oral QHS   nystatin   Topical TID   pantoprazole  40 mg Oral Daily   pregabalin  100 mg Oral TID   rOPINIRole  4 mg Oral QHS   senna-docusate  2 tablet Oral Daily   topiramate  25 mg Oral BID   traZODone  50 mg Oral QHS   umeclidinium bromide  1 puff Inhalation Daily   Continuous Infusions:    Principal Problem:   Complicated UTI (urinary tract infection) Active Problems:   Asthma   PALPITATIONS, OCCASIONAL   Panniculitis   OSA (obstructive sleep apnea)   Major depressive disorder, recurrent episode, moderate (HCC)   Generalized anxiety disorder   Major depression, chronic   Thrombocytosis   Hypokalemia   CKD (chronic kidney disease), stage III ([place])   Consultants: Psychiatry  Procedures: None  Antibiotics: Fosfomycin x1 on 7/19, Macrodantin 7/21 and 7/22 Ertapenem x1 on 7/26 Cefepime 11/02/2027   Time spent: 25 minutes    Erin Hearing ANP  [facility] 7 am - 330 pm/M-F for direct patient care and secure chat Please refer to Amion for contact info 57  days

## 2020-12-28 NOTE — Progress Notes (Signed)
Occupational Therapy Treatment Patient Details Name: Heather [name] MRN: [id number] DOB: 09-03-1975 Today's Date: 12/28/2020   History of present illness 45 y.o. female presented to ED for chest pain and palpitations and feeling like her throat was closing while at her behavioral health facility for SI. Admitted 10/31/20 for treatment of complicated UTI PMH: anxiety and depression (hx of bipolar and schizoaffective disorder in chart in past), ACD, panniculitis, debility,GERD,  OSA not on cpap, chronic back pain.   OT comments  Patient seen to address self care to increase independence to allow for safe discharge home.  Patient required more encouragement to participate in self care but was willing.  Education on toilet tongs with further education needed. Patient continues to perform well with OT treatment.  Continue with OT.    Recommendations for follow up therapy are one component of a multi-disciplinary discharge planning process, led by the attending physician.  Recommendations may be updated based on patient status, additional functional criteria and insurance authorization.    Follow Up Recommendations  Other (comment) (SW addressing placement)    Equipment Recommendations  3 in 1 bedside commode;Tub/shower bench;Other (comment)    Recommendations for Other Services      Precautions / Restrictions Precautions Precautions: Fall Precaution Comments: fell prior to hosp       Mobility Bed Mobility Overal bed mobility: Needs Assistance Bed Mobility: Supine to Sit Rolling: Supervision   Supine to sit: Supervision     General bed mobility comments: uses rails to assist    Transfers Overall transfer level: Needs assistance Equipment used: Rolling walker (2 wheeled) Transfers: Sit to/from Stand Sit to Stand: Min guard Stand pivot transfers: Supervision       General transfer comment: min guard to power up from 3n1 in shower and transfer in and out of shower     Balance Overall balance assessment: Needs assistance Sitting-balance support: No upper extremity supported;Feet supported Sitting balance-Leahy Scale: Good Sitting balance - Comments: demonstrates good sitting balance   Standing balance support: No upper extremity supported;During functional activity Standing balance-Leahy Scale: Fair Standing balance comment: able to stand to dry self using BUEs                           ADL either performed or assessed with clinical judgement   ADL Overall ADL's : Needs assistance/impaired     Grooming: Brushing hair;Sitting Grooming Details (indicate cue type and reason): setup only Upper Body Bathing: Set up;Sitting Upper Body Bathing Details (indicate cue type and reason): UB bathing in shower with LH sponge for back Lower Body Bathing: Supervison/ safety Lower Body Bathing Details (indicate cue type and reason): LB bathing in shower with LH sponge Upper Body Dressing : Set up;Sitting Upper Body Dressing Details (indicate cue type and reason): Donned overhead gown Lower Body Dressing: Minimal assistance Lower Body Dressing Details (indicate cue type and reason): required assistance to donn socks on this date due to patient complaints of pain Toilet Transfer: Supervision/safety;RW Toilet Transfer Details (indicate cue type and reason): Bariatric BSC over toilet in bathroom Toileting- Clothing Manipulation and Hygiene: Minimal assistance Toileting - Clothing Manipulation Details (indicate cue type and reason): education on toilet tongs Tub/ Shower Transfer: Walk-in shower;Min guard;Cueing for safety;3 in 1 Tub/Shower Transfer Details (indicate cue type and reason): min guard for walk in shower with small ledge to step over Functional mobility during ADLs: Rolling walker;Supervision/safety General ADL Comments: will provide handout for toilet tongs use to  increase understanding     Vision       Perception     Praxis       Cognition Arousal/Alertness: Awake/alert Behavior During Therapy: WFL for tasks assessed/performed Overall Cognitive Status: Within Functional Limits for tasks assessed Area of Impairment: Safety/judgement;Problem solving                   Current Attention Level: Alternating     Safety/Judgement: Decreased awareness of safety Awareness: Intellectual Problem Solving: Requires verbal cues General Comments: required more cues to participate in self care        Exercises     Shoulder Instructions       General Comments      Pertinent Vitals/ Pain       Pain Assessment: 0-10 Pain Score: 3  Faces Pain Scale: Hurts little more Pain Location: R knee after prolonged standing Pain Descriptors / Indicators: Grimacing;Aching Pain Intervention(s): Monitored during session  Home Living                                          Prior Functioning/Environment              Frequency  Min 2X/week        Progress Toward Goals  OT Goals(current goals can now be found in the care plan section)  Progress towards OT goals: Progressing toward goals  Acute Rehab OT Goals Patient Stated Goal: to reduce pain OT Goal Formulation: With patient Time For Goal Achievement: 01/05/21 Potential to Achieve Goals: Good ADL Goals Pt Will Perform Grooming: with modified independence;standing Pt Will Perform Lower Body Bathing: with modified independence;sitting/lateral leans;sit to/from stand;with adaptive equipment Pt Will Perform Lower Body Dressing: with modified independence;with adaptive equipment;sitting/lateral leans;sit to/from stand Pt Will Transfer to Toilet: with modified independence;bedside commode;ambulating Pt Will Perform Toileting - Clothing Manipulation and hygiene: with modified independence;with adaptive equipment;sitting/lateral leans;sit to/from stand Pt Will Perform Tub/Shower Transfer: with modified independence;rolling walker  Plan  Discharge plan remains appropriate    Co-evaluation                 AM-PAC OT "6 Clicks" Daily Activity     Outcome Measure   Help from another person eating meals?: None Help from another person taking care of personal grooming?: A Little Help from another person toileting, which includes using toliet, bedpan, or urinal?: A Little Help from another person bathing (including washing, rinsing, drying)?: A Little Help from another person to put on and taking off regular upper body clothing?: A Little Help from another person to put on and taking off regular lower body clothing?: A Little 6 Click Score: 19    End of Session Equipment Utilized During Treatment: Rolling walker  OT Visit Diagnosis: Unsteadiness on feet (R26.81);Other abnormalities of gait and mobility (R26.89);Muscle weakness (generalized) (M62.81);Pain;Other (comment) Pain - Right/Left: Right Pain - part of body: Hip;Knee   Activity Tolerance Patient tolerated treatment well   Patient Left in chair;with call bell/phone within reach   Nurse Communication Mobility status        Time: 7078-6754 OT Time Calculation (min): 45 min  Charges: OT General Charges $OT Visit: 1 Visit OT Treatments $Self Care/Home Management : 38-52 mins  Lodema Hong, OTA   Shraga Custard Alexis Goodell 12/28/2020, 9:03 AM

## 2020-12-28 NOTE — Progress Notes (Signed)
Brief Narrative: 45 year old female with multiple comorbidities including morbid obesity , T2DM, OSA/COPD, MDD, GAD, insomnia, RLS, renal mass, numerous small wounds under her pannus upper thighs and between her legs presented to the ED 7/25 with right flank and suprapubic pain, nausea vomiting and was admitted for complicated UTI. Treated with antibiotics seen by PT OT has been stable for discharge awaiting on placement to skilled nursing facility. But due to patient's homelessness status  insurance issues, disposition has been difficulty  Subjective: Complains of some pain after stretching Assessment & Plan: UTI/Enterobacter colonization/Nephrolithiasis-non obstructive/Renal cyst- no fever, treated SI/GAD/Depression/Insomnia-stable mood, no more suicidal Possible OSA/OHS- sleep eval as OP.  Currently doing well on room air Cystic acne-hyper much better., treated w. doxy RLS-continue her Requip CKDIIIb-monitor renal fun intermittently.  Creat stable at 1.26  Chronic pain- hip pain, back pain, disc herniation-cont pain control PT OT ambulation supportive care. Yeast panniculitis- treated, nystatin. Leg edema-monitor- prn lasix. Morbid obesity w/ BMI 75- wt loss, pcp fu. Deconditioning -PTOT DTP  Continue current oral meds for pain control.  TOC social worker working on placement reviewed with NP [employer].DVT prophylaxis: lovenox Code Status:   Code Status: Full Code  Family Communication: plan of care discussed with patient at bedside. Objective: Vitals: Today's Vitals   12/28/20 0746 12/28/20 0747 12/28/20 0951 12/28/20 0959  BP:    (!) 153/87  Pulse:    85  Resp:    18  Temp:    98 F (36.7 C)  TempSrc:      SpO2: 96% 98%  97%  Weight:      Height:      PainSc:   Asleep    Examination: General exam: AA oriented, morbidly obese. HEENT:Oral mucosa moist, Ear/Nose WNL grossly,dentition normal. Respiratory system: bilaterally diminished no use of accessory muscle, non  tender. Cardiovascular system: S1 & S2 +,No JVD. Gastrointestinal system: Abdomen soft, NT,ND, BS+. Nervous System:Alert, awake, moving extremities Extremities: no edema, distal peripheral pulses palpable.  Skin: No rashes,no icterus. MSK: Normal muscle bulk,tone, power   Intake/Output Summary (Last 24 hours) at 12/28/2020 1645 Last data filed at 12/28/2020 1300 Gross per 24 hour  Intake 1120 ml  Output 400 ml  Net 720 ml   Filed Weights   12/25/20 2139 12/26/20 2023 12/27/20 2023  Weight: (!) 197.8 kg (!) 195.2 kg (!) 197.1 kg   Weight change: 1.9 kg   Consultants:see note  Procedures:see note Antimicrobials: Anti-infectives (From admission, onward)    Start     Dose/Rate Route Frequency Ordered Stop   12/17/20 1030  doxycycline (VIBRA-TABS) tablet 100 mg        100 mg over 7 Days Oral Every 12 hours 12/17/20 0942 12/24/20 2359   11/01/20 1000  ceFEPIme (MAXIPIME) 2 g in sodium chloride 0.9 % 100 mL IVPB        2 g 200 mL/hr over 30 Minutes Intravenous Every 12 hours 10/31/20 1550 11/03/20 2309   11/01/20 0600  ceFEPIme (MAXIPIME) 2 g in sodium chloride 0.9 % 100 mL IVPB  Status:  Discontinued        2 g 200 mL/hr over 30 Minutes Intravenous Every 8 hours 10/31/20 1541 10/31/20 1550   10/31/20 1000  ertapenem (INVANZ) 1,000 mg in sodium chloride 0.9 % 100 mL IVPB  Status:  Discontinued        1 g 200 mL/hr over 30 Minutes Intravenous Every 24 hours 10/31/20 0948 10/31/20 1541      Culture/Microbiology    Component  Value Date/Time   SDES  10/26/2020 1800    URINE, CLEAN CATCH Performed at [facility], [place] [address] [place], [place] [address]    SPECREQUEST  10/26/2020 1800    NONE Performed at [facility], [place] [address], [place], [place] [address]    CULT MULTIPLE SPECIES PRESENT, SUGGEST RECOLLECTION (A) 10/26/2020 1800   REPTSTATUS 10/28/2020 FINAL 10/26/2020 1800    Other culture-see note  Unresulted Labs (From  admission, onward)     Start     Ordered   12/29/20 0500  CBC with Differential/Platelet  Tomorrow morning,   R       Question:  Specimen collection method  Answer:  Lab=Lab collect   12/28/20 1350   12/28/20 0811  Urine Culture  Once,   R       Question:  Indication  Answer:  Flank Pain   12/28/20 0810   11/07/20 0500  Creatinine, serum  (enoxaparin (LOVENOX)    CrCl >/= 30 ml/min)  Weekly,   R     Comments: while on enoxaparin therapy    10/31/20 1548           Medications reviewed:  Scheduled Meds:  ascorbic acid  500 mg Oral Daily   buPROPion ER  200 mg Oral BID   clotrimazole   Topical BID   diclofenac Sodium  4 g Topical QID   enoxaparin (LOVENOX) injection  100 mg Subcutaneous Q24H   fluticasone  2 spray Each Nare BID   methocarbamol  500 mg Oral QID   mometasone-formoterol  2 puff Inhalation BID   montelukast  10 mg Oral QHS   nystatin   Topical TID   pantoprazole  40 mg Oral Daily   pregabalin  100 mg Oral TID   rOPINIRole  4 mg Oral QHS   senna-docusate  2 tablet Oral Daily   topiramate  25 mg Oral BID   traZODone  50 mg Oral QHS   umeclidinium bromide  1 puff Inhalation Daily   Continuous Infusions:   Intake/Output from previous day: 09/21 0701 - 09/22 0700 In: 1220 [P.O.:1220] Out: -  Intake/Output this shift: Total I/O In: 480 [P.O.:480] Out: 400 [Urine:400] Filed Weights   12/25/20 2139 12/26/20 2023 12/27/20 2023  Weight: (!) 197.8 kg (!) 195.2 kg (!) 197.1 kg   Data Reviewed: I have personally reviewed following labs and imaging studies CBC: Recent Labs  Lab 12/27/20 0357  WBC 13.9*  HGB 10.4*  HCT 35.4*  MCV 84.3  PLT 509*   Basic Metabolic Panel: Recent Labs  Lab 12/26/20 0505 12/27/20 0357  NA  --  137  K  --  4.0  CL  --  105  CO2  --  22  GLUCOSE  --  85  BUN  --  22*  CREATININE 1.29* 1.30*  CALCIUM  --  9.2   GFR: Estimated Creatinine Clearance: 98.2 mL/min (A) (by C-G formula based on SCr of 1.3 mg/dL (H)). Liver  Function Tests: No results for input(s): AST, ALT, ALKPHOS, BILITOT, PROT, ALBUMIN in the last 168 hours. No results for input(s): LIPASE, AMYLASE in the last 168 hours. No results for input(s): AMMONIA in the last 168 hours. Coagulation Profile: No results for input(s): INR, PROTIME in the last 168 hours. Cardiac Enzymes: No results for input(s): CKTOTAL, CKMB, CKMBINDEX, TROPONINI in the last 168 hours. BNP (last 3 results) No results for input(s): PROBNP in the last 8760 hours. HbA1C: No results for  input(s): HGBA1C in the last 72 hours. CBG: No results for input(s): GLUCAP in the last 168 hours. Lipid Profile: No results for input(s): CHOL, HDL, LDLCALC, TRIG, CHOLHDL, LDLDIRECT in the last 72 hours. Thyroid Function Tests: No results for input(s): TSH, T4TOTAL, FREET4, T3FREE, THYROIDAB in the last 72 hours. Anemia Panel: No results for input(s): VITAMINB12, FOLATE, FERRITIN, TIBC, IRON, RETICCTPCT in the last 72 hours. Sepsis Labs: No results for input(s): PROCALCITON, LATICACIDVEN in the last 168 hours.  No results found for this or any previous visit (from the past 240 hour(s)).   Radiology Studies: No results found.   LOS: 73 days   Antonieta Pert, MD [facility]  12/28/2020, 4:45 PM

## 2020-12-28 NOTE — Progress Notes (Addendum)
CSW spoke with Myami at [facility] who states the patient is not active with the agency. Myami to send referral form to CSW for completion.  CSW will complete f\orms and have patient sign them for submission to [facility].  [name], MSW, LCSW Transitions of Care  Clinical Social Worker II [phone]

## 2020-12-29 ENCOUNTER — Inpatient Hospital Stay (HOSPITAL_COMMUNITY): Payer: Medicaid Other

## 2020-12-29 DIAGNOSIS — N390 Urinary tract infection, site not specified: Secondary | ICD-10-CM | POA: Diagnosis not present

## 2020-12-29 LAB — CBC WITH DIFFERENTIAL/PLATELET
Abs Immature Granulocytes: 0.24 10*3/uL — ABNORMAL HIGH (ref 0.00–0.07)
Basophils Absolute: 0.1 10*3/uL (ref 0.0–0.1)
Basophils Relative: 0 %
Eosinophils Absolute: 0.5 10*3/uL (ref 0.0–0.5)
Eosinophils Relative: 4 %
HCT: 32.5 % — ABNORMAL LOW (ref 36.0–46.0)
Hemoglobin: 9.6 g/dL — ABNORMAL LOW (ref 12.0–15.0)
Immature Granulocytes: 2 %
Lymphocytes Relative: 28 %
Lymphs Abs: 3.6 10*3/uL (ref 0.7–4.0)
MCH: 24.9 pg — ABNORMAL LOW (ref 26.0–34.0)
MCHC: 29.5 g/dL — ABNORMAL LOW (ref 30.0–36.0)
MCV: 84.2 fL (ref 80.0–100.0)
Monocytes Absolute: 0.7 10*3/uL (ref 0.1–1.0)
Monocytes Relative: 5 %
Neutro Abs: 8 10*3/uL — ABNORMAL HIGH (ref 1.7–7.7)
Neutrophils Relative %: 61 %
Platelets: 368 10*3/uL (ref 150–400)
RBC: 3.86 MIL/uL — ABNORMAL LOW (ref 3.87–5.11)
RDW: 17.3 % — ABNORMAL HIGH (ref 11.5–15.5)
WBC: 13 10*3/uL — ABNORMAL HIGH (ref 4.0–10.5)
nRBC: 0 % (ref 0.0–0.2)

## 2020-12-29 MED ORDER — FUROSEMIDE 10 MG/ML IJ SOLN
60.0000 mg | Freq: Once | INTRAMUSCULAR | Status: AC
  Administered 2020-12-29: 60 mg via INTRAVENOUS
  Filled 2020-12-29: qty 6

## 2020-12-29 MED ORDER — SODIUM CHLORIDE 0.9 % IV SOLN
2.0000 g | Freq: Three times a day (TID) | INTRAVENOUS | Status: AC
  Administered 2020-12-29 – 2021-01-05 (×23): 2 g via INTRAVENOUS
  Filled 2020-12-29 (×22): qty 2

## 2020-12-29 NOTE — Progress Notes (Signed)
CSW met with patient at bedside to have her sign documents requested by the [facility] to complete a referral. Patient states she had her meeting with a staff member from [employer] [place] yesterday and was told they would switch her application from a non-medical disability to a medical disability. Patient was told her application would take at least 3-6 months to process.  CSW sent referral to [facility] via secure e-mail.  [name], MSW, LCSW Transitions of Care  Clinical Social Worker II [phone]

## 2020-12-29 NOTE — Progress Notes (Signed)
Physical Therapy Treatment Patient Details Name: [name] Saxe Larimer MRN: [id number] DOB: [birth year] Today's Date: 12/29/2020   History of Present Illness 45 y.o. female presented to ED for chest pain and palpitations and feeling like her throat was closing while at her behavioral health facility for SI. Admitted 10/31/20 for treatment of complicated UTI PMH: anxiety and depression (hx of bipolar and schizoaffective disorder in chart in past), ACD, panniculitis, debility,GERD,  OSA not on cpap, chronic back pain.    PT Comments    Pt reports that she has been started on IV antibiotics for likely UTI and that her BP has been low. Agreeable to ambulation in hallway but does not want to participate in any functional activities. Pt continues to be supervision for bed mobility and min guard for transfers and 2 bouts of ambulation(100 feet and 80 feet) and then requests to go back to bed. Pt continues to need a discharge plan that will provide her assistance at least at the iADL level but ideally at the bADL level. PT/OT will continue to work to progress pt to more independence to be considered for addition discharge dispositions.     Recommendations for follow up therapy are one component of a multi-disciplinary discharge planning process, led by the attending physician.  Recommendations may be updated based on patient status, additional functional criteria and insurance authorization.  Follow Up Recommendations  Other (comment);Supervision - Intermittent (ALF/Group Home)     Equipment Recommendations  None recommended by PT (owns bariatric RW)       Precautions / Restrictions Precautions Precautions: Fall Precaution Comments: fell prior to hosp     Mobility  Bed Mobility Overal bed mobility: Needs Assistance Bed Mobility: Supine to Sit;Sit to Supine     Supine to sit: Supervision Sit to supine: Supervision   General bed mobility comments: HoB elevated and increased use of bed rails to  pull to EoB    Transfers Overall transfer level: Needs assistance Equipment used: None Transfers: Sit to/from Stand Sit to Stand: Min guard         General transfer comment: min guard for power up from recliner x3 during session  Ambulation/Gait Ambulation/Gait assistance: Min guard Gait Distance (Feet): 100 Feet (+80) Assistive device:  (bariatric RW,) Gait Pattern/deviations: Step-through pattern;Trunk flexed Gait velocity: within safe limits Gait velocity interpretation: 1.31 - 2.62 ft/sec, indicative of limited community ambulator General Gait Details: min guard for safety, pt wanted to trial ambulation without RW today and was able to walk 15 feet across open space with steady gait, pt continues to increase gait velocity when ambulating without AD, vc for slowing pace         Balance Overall balance assessment: Needs assistance Sitting-balance support: No upper extremity supported;Feet supported Sitting balance-Leahy Scale: Good     Standing balance support: No upper extremity supported Standing balance-Leahy Scale: Fair                              Cognition Arousal/Alertness: Awake/alert Behavior During Therapy: WFL for tasks assessed/performed Overall Cognitive Status: Within Functional Limits for tasks assessed Area of Impairment: Safety/judgement;Problem solving                   Current Attention Level: Alternating     Safety/Judgement: Decreased awareness of safety Awareness: Intellectual Problem Solving: Requires verbal cues General Comments: pt with less conversation today reporting that she has not been feeling well and that she has  been started back on IV antibiotics for UTI         General Comments General comments (skin integrity, edema, etc.): Pt reports low BP this morning in sitting 90/64 after ambulation 109/88      Pertinent Vitals/Pain Pain Assessment: 0-10 Faces Pain Scale: Hurts a little bit Pain Location:  generalized Pain Descriptors / Indicators: Grimacing;Aching Pain Intervention(s): Limited activity within patient's tolerance;Monitored during session;Repositioned     PT Goals (current goals can now be found in the care plan section) Acute Rehab PT Goals Patient Stated Goal: to reduce pain PT Goal Formulation: With patient Time For Goal Achievement: 12/12/20 Potential to Achieve Goals: Fair Progress towards PT goals: Progressing toward goals    Frequency    Min 3X/week      PT Plan Current plan remains appropriate       AM-PAC PT "6 Clicks" Mobility   Outcome Measure  Help needed turning from your back to your side while in a flat bed without using bedrails?: A Little Help needed moving from lying on your back to sitting on the side of a flat bed without using bedrails?: A Little Help needed moving to and from a bed to a chair (including a wheelchair)?: A Little Help needed standing up from a chair using your arms (e.g., wheelchair or bedside chair)?: A Little Help needed to walk in hospital room?: A Little Help needed climbing 3-5 steps with a railing? : A Lot 6 Click Score: 17    End of Session   Activity Tolerance: Patient tolerated treatment well Patient left: with call bell/phone within reach;in bed Nurse Communication: Mobility status PT Visit Diagnosis: Unsteadiness on feet (R26.81);Muscle weakness (generalized) (M62.81);History of falling (Z91.81) Pain - Right/Left: Right Pain - part of body: Hip (low back)     Time: 7473-4037 PT Time Calculation (min) (ACUTE ONLY): 32 min  Charges:  $Therapeutic Exercise: 23-37 mins                     Schyler Counsell B. Migdalia Dk PT, DPT Acute Rehabilitation Services Pager [phone] Office [phone]    [place] 12/29/2020, 4:07 PM

## 2020-12-29 NOTE — Progress Notes (Addendum)
[facility] PROGRESS NOTE  Heather [name] [id number]:[id number] DOB: [birth year] DOA: 10/30/2020 PCP: Lucianne Lei, MD  Status: Remains inpatient appropriate because:Unsafe d/c plan-patient able to mobilize with a walker 300 feet but is morbidly obese and is unable to navigate stairs given her obesity, severe bilateral knee osteoarthritis, multiple spinal disc herniations and requirement for walker for mobility.  In addition given all of these issues camping out in a tent with sleeping back provided for patient by [facility] is not appropriate either since she is unable to get up and down off of a flat surface independently.  Dispo:  Patient From:  Homeless-newly homeless since May 2022  Planned Disposition: ALF  Medically stable for discharge: Yes  Barriers to discharge: Morbid obesity prohibits safe discharge to homeless shelter given decreased mobility and sheer size makes it difficult to find a homeless shelter    Level of care: Med-Surg  Code Status: Full Family Communication: Patient only DVT prophylaxis: Lovenox COVID vaccination status: Moderna x1 on 11/06/2020  and Pfizer x1 on 08/28/2020    HPI: 45 y.o. female with PMH significant for morbid obesity, DM2, obstructive sleep apnea, COPD, MDD, GAD, insomnia and RLS,renal mass,  and numerous small wounds under her pannus, upper thighs and between her legs.   Patient presented to the ED on 7/25 presented with right flank and suprapubic pain along with nausea and vomiting.   She was admitted and treated for complicated UTI with antibiotics and has completed treatment.  Currently medically stable for discharge to SNF.  Subjective: Informed about rationale for IV antibiotics.  Patient reporting bilateral pedal edema and discomfort in her feet.   Objective: Vitals:   12/29/20 0530 12/29/20 0754  BP: 97/60   Pulse: 64   Resp: 16   Temp: 97.7 F (36.5 C)   SpO2: 98% 98%    Intake/Output Summary (Last 24 hours) at 12/29/2020  3007 Last data filed at 12/28/2020 2154 Gross per 24 hour  Intake 957 ml  Output 400 ml  Net 557 ml   Filed Weights   12/26/20 2023 12/27/20 2023 12/28/20 2053  Weight: (!) 195.2 kg (!) 197.1 kg (!) 198.4 kg    Exam:  Constitutional: NAD, calm Respiratory: CTA, RA, wheezing or increased work of breathing.  Pulse oximetry remains normal Cardiovascular: S1-S2, normotensive, regular pulse, mild bilateral pedal edema Abdomen: Soft and nontender.  Obese.  Bowel sounds positive. LBM 9/22 Neurologic: CN 2-12 grossly intact. Sensation intact, Strength 4/5 x all 4 extremities.  Psychiatric: Normal judgment and insight. Alert and oriented x 3.    Assessment/Plan: Acute problems: Complicated UTI presumed 2/2 Enterobacter colonization -Patient completed 3 days of cefepime, urine culture colonized  Evolving UTI -Given leukocytosis and her complaints of back pain, dysuria and suprapubic discomfort catheterized urinalysis and culture obtained -Concern over possible recurrent Enterobacter since she is symptomatic-positive for only 30,000 colonies-T-max 99.2 on 9/23 -WBC remains elevated at 13,000 with normal neutrophils but elevated immature neutrophils -As a precaution we will begin empiric cefepime with pharmacy dosing   Nonobstructing nephrolithiasis Renal cyst -Imaging this admission revealed bilateral nonobstructing renal calculi with a staghorn calculus on the left.  There was also noted to be a 8.3 cm benign-appearing right upper pole cyst.  Recommendation is to follow-up with urology as an outpatient. -Appears to have recurrent Enterobacter UTI although colony count is low she is symptomatic with low-grade fevers leukocytosis -We will check renal ultrasound to see if she has developed hydronephrosis   Mild pedal edema -Lasix 60  mg IV x1 and check electrolytes in a.m.  Suicidal ideation/anxiety and depression -Psychiatry has cleared the patient for discharge.  OP FU rec -Continue  Wellbutrin, trazodone, and prn clonazepam  Suspected OSA/OHS -Patient with O2 saturations between 90 and 94% while sleeping -Recommend outpatient sleep study  Right hip/back/leg pain and patient with known disc herniation on imaging -Back pain (chronic).  Continue outpatient pain medications (Topamax, meloxicam and Robaxin).  -Continue Lyrica and Oxy IR  Asthma -Continue DuoNebs as needed -Started on long-acting agents this admission: Dulera as well as Umeclidinium (nonformulary for Medicaid and requires prior approval); continue Singulair  CKD 3B -Used to be on indapamide in the past.   --Creatinine stable with GFR less than 60 and with resumption of meloxicam creatinine stable at 1.38 with previous reading 1.25   Generalized deconditioning Homelessness -PT/OT initially recommended SNF.  History recent therapy sessions demonstrate increased independence with mobility when utilizing rolling walker. -Primary barriers to ambulation are patient's motivation and ongoing back pain which is being treated as above as well as her known thoracic and lumbar disc herniation causing chronic pain which is compounded by her morbid obesity -Disability has been applied for  Thoracic and lumbar spine disc herniation -Has difficulty with mobility and chronic pain secondary to disc herniation which is significantly compounded by patient's profound morbid obesity -Because of chronic pain from this condition patient has not been able to obtain appropriate employment as the jobs she is qualified for require her to walk, stand or lift which she is unable to do  Cystic acne -Resolved and has completed a course of doxycycline    Yeast panniculitis -Continue topical antifungal   Morbid obesity  -Body mass index is 76.53 kg/m. Patient has been advised to make an attempt to improve diet and exercise patterns to aid in weight loss.   Generalized anxiety disorder/restless legs -Continue Wellbutrin, BuSpar,  Neurontin, Requip.      Data Reviewed: Basic Metabolic Panel: Recent Labs  Lab 12/26/20 0505 12/27/20 0357  NA  --  137  K  --  4.0  CL  --  105  CO2  --  22  GLUCOSE  --  85  BUN  --  22*  CREATININE 1.29* 1.30*  CALCIUM  --  9.2    Scheduled Meds:  ascorbic acid  500 mg Oral Daily   buPROPion ER  200 mg Oral BID   clotrimazole   Topical BID   diclofenac Sodium  4 g Topical QID   enoxaparin (LOVENOX) injection  100 mg Subcutaneous Q24H   fluticasone  2 spray Each Nare BID   methocarbamol  500 mg Oral QID   mometasone-formoterol  2 puff Inhalation BID   montelukast  10 mg Oral QHS   nystatin   Topical TID   pantoprazole  40 mg Oral Daily   pregabalin  100 mg Oral TID   rOPINIRole  4 mg Oral QHS   senna-docusate  2 tablet Oral Daily   topiramate  25 mg Oral BID   traZODone  50 mg Oral QHS   umeclidinium bromide  1 puff Inhalation Daily   Continuous Infusions:    Principal Problem:   Complicated UTI (urinary tract infection) Active Problems:   Asthma   PALPITATIONS, OCCASIONAL   Panniculitis   OSA (obstructive sleep apnea)   Major depressive disorder, recurrent episode, moderate (HCC)   Generalized anxiety disorder   Major depression, chronic   Thrombocytosis   Hypokalemia   CKD (chronic kidney disease),  stage III [facility])   Consultants: Psychiatry  Procedures: None  Antibiotics: Fosfomycin x1 on 7/19, Macrodantin 7/21 and 7/22 Ertapenem x1 on 7/26 Cefepime 7/27 through 7/30 Doxycycline 9/11 through 9/18   Time spent: 25 minutes    Erin Hearing ANP  [facility] 7 am - 330 pm/M-F for direct patient care and secure chat Please refer to Amion for contact info 58  days

## 2020-12-29 NOTE — Progress Notes (Signed)
Pharmacy Antibiotic Note  Heather [name] is a 45 y.o. female admitted on 10/30/2020.  Pt with prolonged hospital stay awaiting safe disposition.  Pt with new complaints of back pain, dysuria, and suprapubic discomfort.  Pharmacy has been consulted to start Cefepime for UTI.  Pt has a hx of  resistant Enterobacter UTI.   SCr a baseline ~ 1.3, CrCl ~ 90 ml/min.  Plan: Cefepime 2gm IV q8h Follow-up culture data and clinical progress.  Height: 5\' 3"  (160 cm) Weight: (!) 198.4 kg (437 lb 6.3 oz) IBW/kg (Calculated) : 52.4  Temp (24hrs), Avg:98 F (36.7 C), Min:97.7 F (36.5 C), Max:98.6 F (37 C)  Recent Labs  Lab 12/26/20 0505 12/27/20 0357 12/29/20 0111  WBC  --  13.9* 13.0*  CREATININE 1.29* 1.30*  --     Estimated Creatinine Clearance: 98.6 mL/min (A) (by C-G formula based on SCr of 1.3 mg/dL (H)).    Allergies  Allergen Reactions   Amoxicillin Anaphylaxis   Bee Venom Shortness Of Breath   Penicillins Anaphylaxis    Tolerates cephalexin and ceftriaxone Has patient had a PCN reaction causing immediate rash, facial/tongue/throat swelling, SOB or lightheadedness with hypotension: No Has patient had a PCN reaction causing severe rash involving mucus membranes or skin necrosis: No Has patient had a PCN reaction that required hospitalization No Has patient had a PCN reaction occurring within the last 10 years: No If all of the above answers are "NO", then may proceed with Cephalosporin use.'  REACTION: Angioedema   Penicillin G    Adhesive [Tape] Rash   Latex Rash   Vancomycin Other (See Comments)    Pt can tolerate Vancomycin but did cause Vancomycin Infusion Reaction.  Recommend to pre-medicate with Benadryl before doses administered.      Antimicrobials this admission: ertapenem x1 on 7/26 cefepime 7/27 >> 7/29 doxycycline 9/11 >> 9/18 Cefepime 9/23 >>  Dose adjustments this admission:  Microbiology results: Ucx 7/22 (care everywhere): >100,000 Entereobacter  cloacae 9/22 UCx: sent  Thank you for allowing pharmacy to be a part of this patient's care.  [employer], Pharm.D., BCPS Clinical Pharmacist  **Pharmacist phone directory can be found on [email or web address] listed under [place].  12/29/2020 9:04 AM

## 2020-12-30 DIAGNOSIS — N390 Urinary tract infection, site not specified: Secondary | ICD-10-CM | POA: Diagnosis not present

## 2020-12-30 LAB — CBC WITH DIFFERENTIAL/PLATELET
Abs Immature Granulocytes: 0.18 10*3/uL — ABNORMAL HIGH (ref 0.00–0.07)
Basophils Absolute: 0.1 10*3/uL (ref 0.0–0.1)
Basophils Relative: 0 %
Eosinophils Absolute: 0.4 10*3/uL (ref 0.0–0.5)
Eosinophils Relative: 4 %
HCT: 32.7 % — ABNORMAL LOW (ref 36.0–46.0)
Hemoglobin: 9.8 g/dL — ABNORMAL LOW (ref 12.0–15.0)
Immature Granulocytes: 2 %
Lymphocytes Relative: 23 %
Lymphs Abs: 2.6 10*3/uL (ref 0.7–4.0)
MCH: 24.9 pg — ABNORMAL LOW (ref 26.0–34.0)
MCHC: 30 g/dL (ref 30.0–36.0)
MCV: 83.2 fL (ref 80.0–100.0)
Monocytes Absolute: 0.7 10*3/uL (ref 0.1–1.0)
Monocytes Relative: 6 %
Neutro Abs: 7.5 10*3/uL (ref 1.7–7.7)
Neutrophils Relative %: 65 %
Platelets: 361 10*3/uL (ref 150–400)
RBC: 3.93 MIL/uL (ref 3.87–5.11)
RDW: 17.4 % — ABNORMAL HIGH (ref 11.5–15.5)
WBC: 11.4 10*3/uL — ABNORMAL HIGH (ref 4.0–10.5)
nRBC: 0 % (ref 0.0–0.2)

## 2020-12-30 LAB — BASIC METABOLIC PANEL
Anion gap: 6 (ref 5–15)
BUN: 22 mg/dL — ABNORMAL HIGH (ref 6–20)
CO2: 26 mmol/L (ref 22–32)
Calcium: 8.9 mg/dL (ref 8.9–10.3)
Chloride: 107 mmol/L (ref 98–111)
Creatinine, Ser: 1.33 mg/dL — ABNORMAL HIGH (ref 0.44–1.00)
GFR, Estimated: 51 mL/min — ABNORMAL LOW (ref >60)
Glucose, Bld: 88 mg/dL (ref 70–99)
Potassium: 3.6 mmol/L (ref 3.5–5.1)
Sodium: 139 mmol/L (ref 135–145)

## 2020-12-30 LAB — URINE CULTURE: Culture: 30000 — AB

## 2020-12-30 NOTE — Plan of Care (Signed)
  Problem: Health Behavior/Discharge Planning: Goal: Ability to manage health-related needs will improve Outcome: Progressing   Problem: Activity: Goal: Risk for activity intolerance will decrease Outcome: Progressing   

## 2020-12-30 NOTE — Progress Notes (Signed)
Brief Narrative: 45 year old female with multiple comorbidities including morbid obesity , T2DM, OSA/COPD, MDD, GAD, insomnia, RLS, renal mass, numerous small wounds under her pannus upper thighs and between her legs presented to the ED 7/25 with right flank and suprapubic pain, nausea vomiting and was admitted for complicated UTI. Treated with antibiotics seen by PT OT has been stable for discharge awaiting on placement to skilled nursing facility.But due to patient's homelessness status  insurance issues, disposition has been difficulty  Subjective:  Seen this morning.  Alert awake oriented no complaint.  Reports some pain on the left thigh where she had a burn from MRI   Assessment & Plan: Proteus UTI:  Enterobacter colonization/Nephrolithiasis Non obstructive left renal calculi  Stable Renal cyst/Rt upper pole renal mass-seen in MRI [place] 9/6, a& in [place] 9/23,stable since 2019 Urine culture from 9/22 shows Proteus mirabilis, GNR-was placed on cefepime - once final culture is back de-escalate to IV ceftriaxone.Patient had urinary symptoms on 9/22.  She is afebrile.  Left thigh burn mark from MRI per patient.  Wound care consult, continue local dressing  Anemia, suspect 2/2 chronic disease/chronic kidney disease hemoglobin is stable at 9 to 10 g.  Monitor  SI/GAD/Depression/Insomnia-mood is stable.no more suicidal.  Continue Wellbutrin, Klonopin trazodone  Possible OSA/OHS- sleep eval as OP.  Currently doing well on room air  Cystic acne-treated w. Doxy.  Monitor  Asthma continue DuoNeb, bronchodilators Singulair.  Not wheezing  RLS-continue Requip  CKDIIIb-stable renal function monitor intermittently   Chronic pain- hip pain, back pain, disc herniation-cont pain control PT OT ambulation supportive care. Yeast panniculitis- treated W/ nystatin. Leg edema-monitor- prn lasix.  Normally on Lasix at home  Super Morbid obesity w/ BMI 75- wt loss, pcp fu. Deconditioning  -PTOT DTP/homelessness/deconditioning debility: PT OT and snf placement  DVT prophylaxis: lovenox Code Status:   Code Status: Full Code  Family Communication: plan of care discussed with patient at bedside. Objective: Vitals: Today's Vitals   12/29/20 2059 12/29/20 2254 12/30/20 0457 12/30/20 0946  BP: (!) 100/55  96/61 111/62  Pulse: 86  70 82  Resp: 18  18 18   Temp: 98.3 F (36.8 C)   98.5 F (36.9 C)  TempSrc:      SpO2: 97%  100% 100%  Weight: (!) 192.1 kg     Height:      PainSc:  Asleep     Examination: General exam: AAOx3, obese, HEENT:Oral mucosa moist, Ear/Nose WNL grossly, dentition normal. Respiratory system: bilaterally diminished,no use of accessory muscle Cardiovascular system: S1 & S2 +, No JVD,. Gastrointestinal system: Abdomen soft, NT,ND, BS+ Nervous System:Alert, awake, moving extremities and grossly nonfocal Extremities: Chronic appearing lymphedematous legs, distal peripheral pulses palpable.  Skin: No rashes,no icterus. MSK: Normal muscle bulk,tone, power  Acne on the back Left thigh with black eschar and burn mark  Intake/Output Summary (Last 24 hours) at 12/30/2020 1108 Last data filed at 12/30/2020 0749 Gross per 24 hour  Intake 1531 ml  Output 3350 ml  Net -1819 ml    Filed Weights   12/27/20 2023 12/28/20 2053 12/29/20 2059  Weight: (!) 197.1 kg (!) 198.4 kg (!) 192.1 kg   Weight change: -6.256 kg   Consultants:see note  Procedures:see note Antimicrobials: Anti-infectives (From admission, onward)    Start     Dose/Rate Route Frequency Ordered Stop   12/29/20 1000  ceFEPIme (MAXIPIME) 2 g in sodium chloride 0.9 % 100 mL IVPB        2 g 200 mL/hr  over 30 Minutes Intravenous Every 8 hours 12/29/20 0905     12/17/20 1030  doxycycline (VIBRA-TABS) tablet 100 mg        100 mg over 7 Days Oral Every 12 hours 12/17/20 0942 12/24/20 2359   11/01/20 1000  ceFEPIme (MAXIPIME) 2 g in sodium chloride 0.9 % 100 mL IVPB        2 g 200 mL/hr  over 30 Minutes Intravenous Every 12 hours 10/31/20 1550 11/03/20 2309   11/01/20 0600  ceFEPIme (MAXIPIME) 2 g in sodium chloride 0.9 % 100 mL IVPB  Status:  Discontinued        2 g 200 mL/hr over 30 Minutes Intravenous Every 8 hours 10/31/20 1541 10/31/20 1550   10/31/20 1000  ertapenem (INVANZ) 1,000 mg in sodium chloride 0.9 % 100 mL IVPB  Status:  Discontinued        1 g 200 mL/hr over 30 Minutes Intravenous Every 24 hours 10/31/20 0948 10/31/20 1541      Culture/Microbiology    Component Value Date/Time   SDES URINE, CATHETERIZED 12/28/2020 0811   SPECREQUEST NONE 12/28/2020 0811   CULT (A) 12/28/2020 0811    30,000 COLONIES/mL GRAM NEGATIVE RODS 20,000 COLONIES/mL PROTEUS MIRABILIS IDENTIFICATION AND SUSCEPTIBILITIES TO FOLLOW Performed at [facility], [address], [place], [place] [address]    REPTSTATUS PENDING 12/28/2020 6045    Other culture-see note  Unresulted Labs (From admission, onward)     Start     Ordered   11/07/20 0500  Creatinine, serum  (enoxaparin (LOVENOX)    CrCl >/= 30 ml/min)  Weekly,   R     Comments: while on enoxaparin therapy    10/31/20 1548           Medications reviewed:  Scheduled Meds:  ascorbic acid  500 mg Oral Daily   buPROPion ER  200 mg Oral BID   clotrimazole   Topical BID   diclofenac Sodium  4 g Topical QID   enoxaparin (LOVENOX) injection  100 mg Subcutaneous Q24H   fluticasone  2 spray Each Nare BID   methocarbamol  500 mg Oral QID   mometasone-formoterol  2 puff Inhalation BID   montelukast  10 mg Oral QHS   nystatin   Topical TID   pantoprazole  40 mg Oral Daily   pregabalin  100 mg Oral TID   rOPINIRole  4 mg Oral QHS   senna-docusate  2 tablet Oral Daily   topiramate  25 mg Oral BID   traZODone  50 mg Oral QHS   umeclidinium bromide  1 puff Inhalation Daily   Continuous Infusions:  ceFEPime (MAXIPIME) IV 2 g (12/30/20 0837)     Intake/Output from previous day: 09/23 0701 - 09/24 0700 In: 4098  [P.O.:1411; IV Piggyback:100] Out: 3350 [Urine:3350] Intake/Output this shift: Total I/O In: 240 [P.O.:240] Out: -  Filed Weights   12/27/20 2023 12/28/20 2053 12/29/20 2059  Weight: (!) 197.1 kg (!) 198.4 kg (!) 192.1 kg   Data Reviewed: I have personally reviewed following labs and imaging studies CBC: Recent Labs  Lab 12/27/20 0357 12/29/20 0111 12/30/20 0415  WBC 13.9* 13.0* 11.4*  NEUTROABS  --  8.0* 7.5  HGB 10.4* 9.6* 9.8*  HCT 35.4* 32.5* 32.7*  MCV 84.3 84.2 83.2  PLT 423* 368 119    Basic Metabolic Panel: Recent Labs  Lab 12/26/20 0505 12/27/20 0357 12/30/20 0415  NA  --  137 139  K  --  4.0 3.6  CL  --  105 107  CO2  --  22 26  GLUCOSE  --  85 88  BUN  --  22* 22*  CREATININE 1.29* 1.30* 1.33*  CALCIUM  --  9.2 8.9    GFR: Estimated Creatinine Clearance: 94.2 mL/min (A) (by C-G formula based on SCr of 1.33 mg/dL (H)). Liver Function Tests: No results for input(s): AST, ALT, ALKPHOS, BILITOT, PROT, ALBUMIN in the last 168 hours. No results for input(s): LIPASE, AMYLASE in the last 168 hours. No results for input(s): AMMONIA in the last 168 hours. Coagulation Profile: No results for input(s): INR, PROTIME in the last 168 hours. Cardiac Enzymes: No results for input(s): CKTOTAL, CKMB, CKMBINDEX, TROPONINI in the last 168 hours. BNP (last 3 results) No results for input(s): PROBNP in the last 8760 hours. HbA1C: No results for input(s): HGBA1C in the last 72 hours. CBG: No results for input(s): GLUCAP in the last 168 hours. Lipid Profile: No results for input(s): CHOL, HDL, LDLCALC, TRIG, CHOLHDL, LDLDIRECT in the last 72 hours. Thyroid Function Tests: No results for input(s): TSH, T4TOTAL, FREET4, T3FREE, THYROIDAB in the last 72 hours. Anemia Panel: No results for input(s): VITAMINB12, FOLATE, FERRITIN, TIBC, IRON, RETICCTPCT in the last 72 hours. Sepsis Labs: No results for input(s): PROCALCITON, LATICACIDVEN in the last 168 hours.  Recent  Results (from the past 240 hour(s))  Urine Culture     Status: Abnormal (Preliminary result)   Collection Time: 12/28/20  8:11 AM   Specimen: Urine, Catheterized  Result Value Ref Range Status   Specimen Description URINE, CATHETERIZED  Final   Special Requests NONE  Final   Culture (A)  Final    30,000 COLONIES/mL GRAM NEGATIVE RODS 20,000 COLONIES/mL PROTEUS MIRABILIS IDENTIFICATION AND SUSCEPTIBILITIES TO FOLLOW Performed at [facility], [address], [place], [place] [address]    Report Status PENDING  Incomplete   Organism ID, Bacteria PROTEUS MIRABILIS (A)  Final      Susceptibility   Proteus mirabilis - MIC*    AMPICILLIN <=2 SENSITIVE Sensitive     CEFAZOLIN <=4 SENSITIVE Sensitive     CEFEPIME <=0.12 SENSITIVE Sensitive     CEFTRIAXONE <=0.25 SENSITIVE Sensitive     CIPROFLOXACIN <=0.25 SENSITIVE Sensitive     GENTAMICIN <=1 SENSITIVE Sensitive     IMIPENEM 2 SENSITIVE Sensitive     NITROFURANTOIN RESISTANT Resistant     TRIMETH/SULFA <=20 SENSITIVE Sensitive     AMPICILLIN/SULBACTAM <=2 SENSITIVE Sensitive     PIP/TAZO <=4 SENSITIVE Sensitive     * 20,000 COLONIES/mL PROTEUS MIRABILIS     Radiology Studies: US RENAL  Result Date: 12/29/2020 CLINICAL DATA:  Nephrolithiasis. EXAM: RENAL / URINARY TRACT ULTRASOUND COMPLETE COMPARISON:  Aug 14, 2020 FINDINGS: Right Kidney: Renal measurements: 16.9 cm x 7.2 cm x 6.9 cm = volume: 436.0 mL. Echogenicity within normal limits. An 8.5 cm x 7.2 cm x 7.4 cm heterogeneous, hypoechoic mass is seen within the upper pole of the right kidney. No abnormal flow is noted within this region on color Doppler evaluation. No hydronephrosis is visualized. Left Kidney: Renal measurements: 13.9 cm x 6.8 cm x 5.5 cm = volume: 271.4 mL. Echogenicity within normal limits. 1.4 cm and 2.1 cm shadowing, echogenic foci are seen within the left kidney. These are present on the prior study. No mass or hydronephrosis visualized. Bladder: Urinary  bladder is poorly visualized. Other: It should be noted that the study is limited in evaluation secondary to the patient's body habitus. IMPRESSION:  1. Stable right upper pole renal mass. MRI correlation is recommended, as an underlying neoplastic process can not be excluded. 2. Nonobstructing left renal calculi. Electronically Signed   By: Virgina Norfolk M.D.   On: 12/29/2020 19:30     LOS: 59 days   Antonieta Pert, MD [facility]  12/30/2020, 11:08 AM

## 2020-12-30 NOTE — Progress Notes (Signed)
Patient made this RN aware of pain associated with a left hip/thigh burn, that patient stated was from her previous MRI.  Foam was placed, MD aware, WOC consult placed.   Will continue to monitor.  Aurther Loft, RN

## 2020-12-31 DIAGNOSIS — N390 Urinary tract infection, site not specified: Secondary | ICD-10-CM | POA: Diagnosis not present

## 2020-12-31 NOTE — Progress Notes (Signed)
Brief Narrative: 45 year old female with multiple comorbidities including morbid obesity , T2DM, OSA/COPD, MDD, GAD, insomnia, RLS, renal mass, numerous small wounds under her pannus upper thighs and between her legs presented to the ED 7/25 with right flank and suprapubic pain, nausea vomiting and was admitted for complicated UTI. Treated with antibiotics seen by PT OT has been stable for discharge awaiting on placement to skilled nursing facility.But due to patient's homelessness status  insurance issues, disposition has been difficulty  Subjective:  Seen and examined.  No new complaints.  Mild pain on the left thigh with dressing is placed.  Assessment & Plan: Proteus UTI/Enterobacter cloacae UTI 9/22:  Enterobacter colonization/Nephrolithiasis Non obstructive left renal calculi  Stable Renal cyst/Rt upper pole renal mass-seen in MRI [place] 9/6, a& in [place] 9/23,stable since 2019 Urine culture from 9/22 shows Proteus mirabilis and Enterobacter cloacae.  Continue cefepime.Patient had urinary symptoms on 9/22.  She is afebrile.  Left thigh burn mark patient-claims it is from her MRI, wound care has been consulted on a foam dressing.    Anemia, suspect 2/2 chronic disease/chronic kidney disease hemoglobin is stable at 9 to 10 g.  Monitor  SI/GAD/Depression/Insomnia-mood is stable.no more suicidal.  Continue Wellbutrin, Klonopin trazodone  Possible OSA/OHS- sleep eval as OP.  Currently doing well on room air  Cystic acne-treated w. Doxy.  Monitor  Asthma continue DuoNeb, bronchodilators Singulair.  Not wheezing  RLS-continue Requip  CKDIIIb-stable renal function monitor intermittently   Chronic pain- hip pain, back pain, disc herniation-cont pain control PT OT ambulation supportive care. Yeast panniculitis- treated W/ nystatin. Leg edema-monitor- prn lasix.  Normally on Lasix at home  Super Morbid obesity w/ BMI 75- wt loss, pcp fu. Deconditioning  -PTOT DTP/homelessness/deconditioning debility: PT OT and snf placement  DVT prophylaxis: lovenox Code Status:   Code Status: Full Code  Family Communication: plan of care discussed with patient at bedside. Objective: Vitals: Today's Vitals   12/30/20 2131 12/30/20 2212 12/31/20 0516 12/31/20 0724  BP:   (!) 105/57   Pulse:   70   Resp:   18   Temp:   97.6 F (36.4 C)   TempSrc:      SpO2:    98%  Weight:      Height:      PainSc: 8  2      Examination: General exam: AAOx3, morbidly obese, not in distress, older than stated age, weak appearing. HEENT:Oral mucosa moist, Ear/Nose WNL grossly, dentition normal. Respiratory system: bilaterally diminished no use of accessory muscle Cardiovascular system: S1 & S2 +, No JVD,. Gastrointestinal system: Abdomen soft,Obese NT,ND, BS+ Nervous System:Alert, awake, moving extremities and grossly nonfocal Extremities: Chronic appearing lymphedema on the left, distal peripheral pulses palpable.  Skin: No rashes,no icterus. MSK: Normal muscle bulk,tone, power  Left thigh with dressing in place- has area of black eschar with surrounding erythema   Intake/Output Summary (Last 24 hours) at 12/31/2020 0851 Last data filed at 12/31/2020 0600 Gross per 24 hour  Intake 820 ml  Output --  Net 820 ml    Filed Weights   12/27/20 2023 12/28/20 2053 12/29/20 2059  Weight: (!) 197.1 kg (!) 198.4 kg (!) 192.1 kg   Weight change:    Consultants:see note  Procedures:see note Antimicrobials: Anti-infectives (From admission, onward)    Start     Dose/Rate Route Frequency Ordered Stop   12/29/20 1000  ceFEPIme (MAXIPIME) 2 g in sodium chloride 0.9 % 100 mL IVPB  2 g 200 mL/hr over 30 Minutes Intravenous Every 8 hours 12/29/20 0905     12/17/20 1030  doxycycline (VIBRA-TABS) tablet 100 mg        100 mg over 7 Days Oral Every 12 hours 12/17/20 0942 12/24/20 2359   11/01/20 1000  ceFEPIme (MAXIPIME) 2 g in sodium chloride 0.9 % 100 mL IVPB         2 g 200 mL/hr over 30 Minutes Intravenous Every 12 hours 10/31/20 1550 11/03/20 2309   11/01/20 0600  ceFEPIme (MAXIPIME) 2 g in sodium chloride 0.9 % 100 mL IVPB  Status:  Discontinued        2 g 200 mL/hr over 30 Minutes Intravenous Every 8 hours 10/31/20 1541 10/31/20 1550   10/31/20 1000  ertapenem (INVANZ) 1,000 mg in sodium chloride 0.9 % 100 mL IVPB  Status:  Discontinued        1 g 200 mL/hr over 30 Minutes Intravenous Every 24 hours 10/31/20 0948 10/31/20 1541      Culture/Microbiology    Component Value Date/Time   SDES URINE, CATHETERIZED 12/28/2020 0811   SPECREQUEST  12/28/2020 0811    NONE Performed at [facility] [address], [place], [place] [address]    CULT (A) 12/28/2020 0811    30,000 COLONIES/mL ENTEROBACTER CLOACAE 20,000 COLONIES/mL PROTEUS MIRABILIS    REPTSTATUS 12/30/2020 FINAL 12/28/2020 0017    Other culture-see note  Unresulted Labs (From admission, onward)     Start     Ordered   11/07/20 0500  Creatinine, serum  (enoxaparin (LOVENOX)    CrCl >/= 30 ml/min)  Weekly,   R     Comments: while on enoxaparin therapy    10/31/20 1548           Medications reviewed:  Scheduled Meds:  ascorbic acid  500 mg Oral Daily   buPROPion ER  200 mg Oral BID   clotrimazole   Topical BID   diclofenac Sodium  4 g Topical QID   enoxaparin (LOVENOX) injection  100 mg Subcutaneous Q24H   fluticasone  2 spray Each Nare BID   methocarbamol  500 mg Oral QID   mometasone-formoterol  2 puff Inhalation BID   montelukast  10 mg Oral QHS   nystatin   Topical TID   pantoprazole  40 mg Oral Daily   pregabalin  100 mg Oral TID   rOPINIRole  4 mg Oral QHS   senna-docusate  2 tablet Oral Daily   topiramate  25 mg Oral BID   traZODone  50 mg Oral QHS   umeclidinium bromide  1 puff Inhalation Daily   Continuous Infusions:  ceFEPime (MAXIPIME) IV 2 g (12/31/20 0202)     Intake/Output from previous day: 09/24 0701 - 09/25 0700 In: 1060  [P.O.:960; IV Piggyback:100] Out: -  Intake/Output this shift: No intake/output data recorded. Filed Weights   12/27/20 2023 12/28/20 2053 12/29/20 2059  Weight: (!) 197.1 kg (!) 198.4 kg (!) 192.1 kg   Data Reviewed: I have personally reviewed following labs and imaging studies CBC: Recent Labs  Lab 12/27/20 0357 12/29/20 0111 12/30/20 0415  WBC 13.9* 13.0* 11.4*  NEUTROABS  --  8.0* 7.5  HGB 10.4* 9.6* 9.8*  HCT 35.4* 32.5* 32.7*  MCV 84.3 84.2 83.2  PLT 423* 368 494    Basic Metabolic Panel: Recent Labs  Lab 12/26/20 0505 12/27/20 0357 12/30/20 0415  NA  --  137 139  K  --  4.0 3.6  CL  --  105 107  CO2  --  22 26  GLUCOSE  --  85 88  BUN  --  22* 22*  CREATININE 1.29* 1.30* 1.33*  CALCIUM  --  9.2 8.9    GFR: Estimated Creatinine Clearance: 94.2 mL/min (A) (by C-G formula based on SCr of 1.33 mg/dL (H)). Liver Function Tests: No results for input(s): AST, ALT, ALKPHOS, BILITOT, PROT, ALBUMIN in the last 168 hours. No results for input(s): LIPASE, AMYLASE in the last 168 hours. No results for input(s): AMMONIA in the last 168 hours. Coagulation Profile: No results for input(s): INR, PROTIME in the last 168 hours. Cardiac Enzymes: No results for input(s): CKTOTAL, CKMB, CKMBINDEX, TROPONINI in the last 168 hours. BNP (last 3 results) No results for input(s): PROBNP in the last 8760 hours. HbA1C: No results for input(s): HGBA1C in the last 72 hours. CBG: No results for input(s): GLUCAP in the last 168 hours. Lipid Profile: No results for input(s): CHOL, HDL, LDLCALC, TRIG, CHOLHDL, LDLDIRECT in the last 72 hours. Thyroid Function Tests: No results for input(s): TSH, T4TOTAL, FREET4, T3FREE, THYROIDAB in the last 72 hours. Anemia Panel: No results for input(s): VITAMINB12, FOLATE, FERRITIN, TIBC, IRON, RETICCTPCT in the last 72 hours. Sepsis Labs: No results for input(s): PROCALCITON, LATICACIDVEN in the last 168 hours.  Recent Results (from the past  240 hour(s))  Urine Culture     Status: Abnormal   Collection Time: 12/28/20  8:11 AM   Specimen: Urine, Catheterized  Result Value Ref Range Status   Specimen Description URINE, CATHETERIZED  Final   Special Requests   Final    NONE Performed at [facility], [address], [place], [place] [address]    Culture (A)  Final    30,000 COLONIES/mL ENTEROBACTER CLOACAE 20,000 COLONIES/mL PROTEUS MIRABILIS    Report Status 12/30/2020 FINAL  Final   Organism ID, Bacteria PROTEUS MIRABILIS (A)  Final   Organism ID, Bacteria ENTEROBACTER CLOACAE (A)  Final      Susceptibility   Enterobacter cloacae - MIC*    CEFAZOLIN >=64 RESISTANT Resistant     CEFEPIME 0.5 SENSITIVE Sensitive     CIPROFLOXACIN >=4 RESISTANT Resistant     GENTAMICIN <=1 SENSITIVE Sensitive     IMIPENEM <=0.25 SENSITIVE Sensitive     NITROFURANTOIN <=16 SENSITIVE Sensitive     TRIMETH/SULFA >=320 RESISTANT Resistant     PIP/TAZO 16 SENSITIVE Sensitive     * 30,000 COLONIES/mL ENTEROBACTER CLOACAE   Proteus mirabilis - MIC*    AMPICILLIN <=2 SENSITIVE Sensitive     CEFAZOLIN <=4 SENSITIVE Sensitive     CEFEPIME <=0.12 SENSITIVE Sensitive     CEFTRIAXONE <=0.25 SENSITIVE Sensitive     CIPROFLOXACIN <=0.25 SENSITIVE Sensitive     GENTAMICIN <=1 SENSITIVE Sensitive     IMIPENEM 2 SENSITIVE Sensitive     NITROFURANTOIN RESISTANT Resistant     TRIMETH/SULFA <=20 SENSITIVE Sensitive     AMPICILLIN/SULBACTAM <=2 SENSITIVE Sensitive     PIP/TAZO <=4 SENSITIVE Sensitive     * 20,000 COLONIES/mL PROTEUS MIRABILIS     Radiology Studies: US RENAL  Result Date: 12/29/2020 CLINICAL DATA:  Nephrolithiasis. EXAM: RENAL / URINARY TRACT ULTRASOUND COMPLETE COMPARISON:  Aug 14, 2020 FINDINGS: Right Kidney: Renal measurements: 16.9 cm x 7.2 cm x 6.9 cm = volume: 436.0 mL. Echogenicity within normal limits. An 8.5 cm x 7.2 cm x 7.4 cm heterogeneous, hypoechoic mass is seen within the upper pole of the right kidney. No abnormal  flow is noted within this region on color Doppler evaluation. No hydronephrosis is visualized. Left Kidney: Renal measurements: 13.9 cm x 6.8 cm x 5.5 cm = volume: 271.4 mL. Echogenicity within normal limits. 1.4 cm and 2.1 cm shadowing, echogenic foci are seen within the left kidney. These are present on the prior study. No mass or hydronephrosis visualized. Bladder: Urinary bladder is poorly visualized. Other: It should be noted that the study is limited in evaluation secondary to the patient's body habitus. IMPRESSION: 1. Stable right upper pole renal mass. MRI correlation is recommended, as an underlying neoplastic process can not be excluded. 2. Nonobstructing left renal calculi. Electronically Signed   By: Virgina Norfolk M.D.   On: 12/29/2020 19:30     LOS: 60 days   Antonieta Pert, MD [facility]  12/31/2020, 8:51 AM

## 2021-01-01 DIAGNOSIS — N390 Urinary tract infection, site not specified: Secondary | ICD-10-CM | POA: Diagnosis not present

## 2021-01-01 LAB — BASIC METABOLIC PANEL
Anion gap: 7 (ref 5–15)
BUN: 19 mg/dL (ref 6–20)
CO2: 23 mmol/L (ref 22–32)
Calcium: 8.9 mg/dL (ref 8.9–10.3)
Chloride: 109 mmol/L (ref 98–111)
Creatinine, Ser: 1.11 mg/dL — ABNORMAL HIGH (ref 0.44–1.00)
GFR, Estimated: >60 mL/min (ref >60)
Glucose, Bld: 106 mg/dL — ABNORMAL HIGH (ref 70–99)
Potassium: 4.5 mmol/L (ref 3.5–5.1)
Sodium: 139 mmol/L (ref 135–145)

## 2021-01-01 MED ORDER — POTASSIUM CHLORIDE CRYS ER 20 MEQ PO TBCR
20.0000 meq | EXTENDED_RELEASE_TABLET | Freq: Every day | ORAL | Status: DC
  Administered 2021-01-01 – 2021-01-05 (×5): 20 meq via ORAL
  Filled 2021-01-01 (×5): qty 1

## 2021-01-01 MED ORDER — SILVER SULFADIAZINE 1 % EX CREA
TOPICAL_CREAM | Freq: Every day | CUTANEOUS | Status: DC
  Filled 2021-01-01: qty 85

## 2021-01-01 MED ORDER — FUROSEMIDE 40 MG PO TABS
40.0000 mg | ORAL_TABLET | Freq: Every day | ORAL | Status: DC
  Administered 2021-01-01 – 2021-01-05 (×5): 40 mg via ORAL
  Filled 2021-01-01 (×5): qty 1

## 2021-01-01 NOTE — Plan of Care (Signed)
  Problem: Activity: Goal: Risk for activity intolerance will decrease Outcome: Progressing   Problem: Skin Integrity: Goal: Risk for impaired skin integrity will decrease Outcome: Progressing   
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
## 2021-01-01 NOTE — Progress Notes (Signed)
Pharmacy Antibiotic Note  Heather [name] is a 45 y.o. female admitted on 10/30/2020.  Pt with prolonged hospital stay awaiting safe disposition.  Pt with new complaints of back pain, dysuria, and suprapubic discomfort.  Pharmacy was consulted on 12/29/20 to start Cefepime for UTI.  Pt has a hx of  resistant Enterobacter UTI.   9/22 urine culture grew enterobacter cloacae (R cefazolin, S Cefepime), 20,000 colonies proteus mirabalis (nearly pan-sensitive); both sensitive to cefepime.  Continue Cefepime.  WBC down to 11.4k.  SCr down to 1.11, CrCl  9-100 ml/min.  Plan: ContinueCefepime 2gm IV q8h Follow-up culture data and clinical progress.  Height: 5\' 3"  (160 cm) Weight: (!) 192.1 kg (423 lb 9.6 oz) IBW/kg (Calculated) : 52.4  Temp (24hrs), Avg:98.7 F (37.1 C), Min:98 F (36.7 C), Max:99.6 F (37.6 C)  Recent Labs  Lab 12/26/20 0505 12/27/20 0357 12/29/20 0111 12/30/20 0415 01/01/21 1111  WBC  --  13.9* 13.0* 11.4*  --   CREATININE 1.29* 1.30*  --  1.33* 1.11*     Estimated Creatinine Clearance: 112.9 mL/min (A) (by C-G formula based on SCr of 1.11 mg/dL (H)).    Allergies  Allergen Reactions   Amoxicillin Anaphylaxis   Bee Venom Shortness Of Breath   Penicillins Anaphylaxis    Tolerates cephalexin and ceftriaxone Has patient had a PCN reaction causing immediate rash, facial/tongue/throat swelling, SOB or lightheadedness with hypotension: No Has patient had a PCN reaction causing severe rash involving mucus membranes or skin necrosis: No Has patient had a PCN reaction that required hospitalization No Has patient had a PCN reaction occurring within the last 10 years: No If all of the above answers are "NO", then may proceed with Cephalosporin use.'  REACTION: Angioedema   Penicillin G    Adhesive [Tape] Rash   Latex Rash   Vancomycin Other (See Comments)    Pt can tolerate Vancomycin but did cause Vancomycin Infusion Reaction.  Recommend to pre-medicate with  Benadryl before doses administered.      Antimicrobials this admission: ertapenem x1 on 7/26 cefepime 7/27 >> 7/29 doxycycline 9/11 >> 9/18 Cefepime 9/23 >>  Dose adjustments this admission:  Microbiology results: Ucx 7/22 (care everywhere): >100,000 Entereobacter cloacae 9/22 UCx:  30,000 colonies enterobacter cloacae (R cefazolin, S Cefepime), 20,000 colonies proteus mirabalis (nearly pan-sensitive)    Thank you for allowing pharmacy to be a part of this patient's care. Nicole Cella, RPh Clinical Pharmacist [phone]  **Pharmacist phone directory can be found on [email or web address] listed under [place].  01/01/2021 2:07 PM
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
## 2021-01-01 NOTE — Plan of Care (Signed)
  Problem: Activity: Goal: Risk for activity intolerance will decrease Outcome: Progressing   Problem: Skin Integrity: Goal: Risk for impaired skin integrity will decrease Outcome: Progressing   Problem: Urinary Elimination: Goal: Signs and symptoms of infection will decrease Outcome: Progressing

## 2021-01-01 NOTE — Progress Notes (Signed)
Physical Therapy Treatment Patient Details Name: Heather [name] MRN: [id number] DOB: October 18, 1975 Today's Date: 01/01/2021   History of Present Illness 45 y.o. female presented to ED for chest pain and palpitations and feeling like her throat was closing while at her behavioral health facility for SI. Admitted 10/31/20 for treatment of complicated UTI PMH: anxiety and depression (hx of bipolar and schizoaffective disorder in chart in past), ACD, panniculitis, debility,GERD,  OSA not on cpap, chronic back pain.    PT Comments    Pt sitting in the dark crying on entry. Pt reports her stomach doesn't feel well and she is frustrated with her family for not being more supportive. Impart on pt that becoming independent in mobility will be a step towards her own independence and self reliance. Pt refuses to ambulate in hallway today but reluctantly agreeable to start pre-step training, stepping foot up to stationary step. PT continues to work towards pt independence with ambulation and 3-5 steps.     Recommendations for follow up therapy are one component of a multi-disciplinary discharge planning process, led by the attending physician.  Recommendations may be updated based on patient status, additional functional criteria and insurance authorization.  Follow Up Recommendations  Other (comment);Supervision - Intermittent (ALF/Group Home)     Equipment Recommendations  None recommended by PT (owns bariatric RW)       Precautions / Restrictions Precautions Precautions: Fall Precaution Comments: fell prior to hosp Restrictions Weight Bearing Restrictions: No     Mobility  Bed Mobility               General bed mobility comments: sitting EoB on entry    Transfers Overall transfer level: Needs assistance Equipment used: None Transfers: Sit to/from Stand Sit to Stand: Min guard         General transfer comment: min guard for power up from bed x2 during  session  Ambulation/Gait             General Gait Details: refuses to ambulate in hallways today          Balance Overall balance assessment: Needs assistance Sitting-balance support: No upper extremity supported;Feet supported Sitting balance-Leahy Scale: Good     Standing balance support: No upper extremity supported Standing balance-Leahy Scale: Fair                              Cognition Arousal/Alertness: Awake/alert Behavior During Therapy: Flat affect (tearful)   Area of Impairment: Safety/judgement;Problem solving                   Current Attention Level: Alternating     Safety/Judgement: Decreased awareness of safety Awareness: Intellectual Problem Solving: Requires verbal cues General Comments: very distraught today, sitting in dark crying reporting she is frustrated with family      Exercises Other Exercises Other Exercises: lifting foot to step, 5x each side, and 10x each side.    General Comments General comments (skin integrity, edema, etc.): Reports her stomach is hurting her,      Pertinent Vitals/Pain Pain Assessment: Faces Faces Pain Scale: Hurts little more Pain Location: R knee with stepping Pain Descriptors / Indicators: Grimacing;Aching Pain Intervention(s): Limited activity within patient's tolerance;Monitored during session;Repositioned     PT Goals (current goals can now be found in the care plan section) Acute Rehab PT Goals Patient Stated Goal: to reduce pain PT Goal Formulation: With patient Time For Goal Achievement: 12/12/20 Potential to  Achieve Goals: Fair Progress towards PT goals: Not progressing toward goals - comment (self limiting today)    Frequency    Min 3X/week      PT Plan Current plan remains appropriate       AM-PAC PT "6 Clicks" Mobility   Outcome Measure  Help needed turning from your back to your side while in a flat bed without using bedrails?: A Little Help needed  moving from lying on your back to sitting on the side of a flat bed without using bedrails?: A Little Help needed moving to and from a bed to a chair (including a wheelchair)?: A Little Help needed standing up from a chair using your arms (e.g., wheelchair or bedside chair)?: A Little Help needed to walk in hospital room?: A Little Help needed climbing 3-5 steps with a railing? : A Lot 6 Click Score: 17    End of Session   Activity Tolerance: Patient tolerated treatment well Patient left: with call bell/phone within reach;in bed Nurse Communication: Mobility status PT Visit Diagnosis: Unsteadiness on feet (R26.81);Muscle weakness (generalized) (M62.81);History of falling (Z91.81) Pain - Right/Left: Right Pain - part of body: Leg (low back)     Time: 4967-5916 PT Time Calculation (min) (ACUTE ONLY): 30 min  Charges:  $Therapeutic Exercise: 23-37 mins                     Latoya Diskin B. Migdalia Dk PT, DPT Acute Rehabilitation Services Pager [phone] Office [phone]    [place] 01/01/2021, 5:38 PM

## 2021-01-02 DIAGNOSIS — N390 Urinary tract infection, site not specified: Secondary | ICD-10-CM | POA: Diagnosis not present

## 2021-01-02 LAB — CREATININE, SERUM
Creatinine, Ser: 1.24 mg/dL — ABNORMAL HIGH (ref 0.44–1.00)
GFR, Estimated: 56 mL/min — ABNORMAL LOW (ref >60)

## 2021-01-02 MED ORDER — BUPROPION HCL ER (XL) 150 MG PO TB24
300.0000 mg | ORAL_TABLET | Freq: Every day | ORAL | Status: DC
  Administered 2021-01-03 – 2021-01-05 (×3): 300 mg via ORAL
  Filled 2021-01-02 (×3): qty 2

## 2021-01-02 MED ORDER — ZIPRASIDONE HCL 20 MG PO CAPS
20.0000 mg | ORAL_CAPSULE | Freq: Every day | ORAL | Status: DC
  Administered 2021-01-03 – 2021-01-05 (×3): 20 mg via ORAL
  Filled 2021-01-02 (×3): qty 1

## 2021-01-02 MED ORDER — CLONAZEPAM 0.25 MG PO TBDP
0.2500 mg | ORAL_TABLET | Freq: Every day | ORAL | Status: DC | PRN
  Administered 2021-01-02 – 2021-01-05 (×3): 0.25 mg via ORAL
  Filled 2021-01-02 (×3): qty 1
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
## 2021-01-02 NOTE — Progress Notes (Signed)
Occupational Therapy Treatment Patient Details Name: Heather [name] MRN: [id number] DOB: 1975-12-12 Today's Date: 01/02/2021   History of present illness 45 y.o. female presented to ED for chest pain and palpitations and feeling like her throat was closing while at her behavioral health facility for SI. Admitted 10/31/20 for treatment of complicated UTI PMH: anxiety and depression (hx of bipolar and schizoaffective disorder in chart in past), ACD, panniculitis, debility,GERD,  OSA not on cpap, chronic back pain.   OT comments  Patient received in bed and is supervision for bed mobility.  Patient performed toilet hygiene today without adaptive equipment while standing. Patient continues to require min guard assist for shower transfers and to stand from 3n1.  Patient has made gains with toilet hygiene.  Continue with OT in acute setting.    Recommendations for follow up therapy are one component of a multi-disciplinary discharge planning process, led by the attending physician.  Recommendations may be updated based on patient status, additional functional criteria and insurance authorization.    Follow Up Recommendations  Other (comment) (SW addressing placement)    Equipment Recommendations  3 in 1 bedside commode;Tub/shower bench;Other (comment)    Recommendations for Other Services      Precautions / Restrictions Precautions Precautions: Fall Precaution Comments: fell prior to hosp       Mobility Bed Mobility Overal bed mobility: Needs Assistance Bed Mobility: Supine to Sit;Sit to Supine Rolling: Supervision   Supine to sit: Supervision     General bed mobility comments: uses rails to assist    Transfers Overall transfer level: Needs assistance Equipment used: Rolling walker (2 wheeled) Transfers: Sit to/from Stand Sit to Stand: Min guard Stand pivot transfers: Min guard       General transfer comment: requires min guard for shower transfers to power up     Balance Overall balance assessment: Needs assistance Sitting-balance support: No upper extremity supported;Feet supported Sitting balance-Leahy Scale: Good     Standing balance support: No upper extremity supported Standing balance-Leahy Scale: Fair Standing balance comment: stands during drying self without support                           ADL either performed or assessed with clinical judgement   ADL Overall ADL's : Needs assistance/impaired     Grooming: Set up;Brushing hair;Applying deodorant Grooming Details (indicate cue type and reason): setup only Upper Body Bathing: Set up;Sitting Upper Body Bathing Details (indicate cue type and reason): UB bathing in shower with LH sponge for back Lower Body Bathing: Set up Lower Body Bathing Details (indicate cue type and reason): LB bathing in shower with LH sponge Upper Body Dressing : Minimal assistance;Sitting Upper Body Dressing Details (indicate cue type and reason): hospital gown, needed assistnance to tie back Lower Body Dressing: Supervision/safety;Sitting/lateral leans Lower Body Dressing Details (indicate cue type and reason): used sock aid to donn socks before shower Toilet Transfer: Supervision/safety;RW Toilet Transfer Details (indicate cue type and reason): Bariatric BSC over toilet in bathroom Toileting- Clothing Manipulation and Hygiene: Supervision/safety;Sit to/from stand Toileting - Clothing Manipulation Details (indicate cue type and reason): performed without tongs Tub/ Shower Transfer: Walk-in shower;Min guard;Cueing for safety;3 in 1 Tub/Shower Transfer Details (indicate cue type and reason): min guard for walk in shower with small ledge to step over Functional mobility during ADLs: Rolling walker;Supervision/safety General ADL Comments: was able to perform toilet hygiene without toilet tongs     Vision  Perception     Praxis      Cognition Arousal/Alertness: Awake/alert Behavior  During Therapy: Flat affect Overall Cognitive Status: Within Functional Limits for tasks assessed Area of Impairment: Safety/judgement;Problem solving                   Current Attention Level: Alternating     Safety/Judgement: Decreased awareness of safety Awareness: Intellectual Problem Solving: Requires verbal cues General Comments: tearful concerning her mother's attention to her needs        Exercises     Shoulder Instructions       General Comments      Pertinent Vitals/ Pain       Pain Assessment: Faces Faces Pain Scale: Hurts little more Pain Location: R knee with stepping Pain Descriptors / Indicators: Grimacing;Aching Pain Intervention(s): Monitored during session  Home Living                                          Prior Functioning/Environment              Frequency  Min 2X/week        Progress Toward Goals  OT Goals(current goals can now be found in the care plan section)  Progress towards OT goals: Progressing toward goals  Acute Rehab OT Goals Patient Stated Goal: to reduce pain OT Goal Formulation: With patient Time For Goal Achievement: 01/05/21 Potential to Achieve Goals: Good ADL Goals Pt Will Perform Grooming: with modified independence;standing Pt Will Perform Lower Body Bathing: with modified independence;sitting/lateral leans;sit to/from stand;with adaptive equipment Pt Will Perform Lower Body Dressing: with modified independence;with adaptive equipment;sitting/lateral leans;sit to/from stand Pt Will Transfer to Toilet: with modified independence;bedside commode;ambulating Pt Will Perform Toileting - Clothing Manipulation and hygiene: with modified independence;with adaptive equipment;sitting/lateral leans;sit to/from stand Pt Will Perform Tub/Shower Transfer: with modified independence;rolling walker  Plan Discharge plan remains appropriate    Co-evaluation                 AM-PAC OT "6 Clicks"  Daily Activity     Outcome Measure   Help from another person eating meals?: None Help from another person taking care of personal grooming?: A Little Help from another person toileting, which includes using toliet, bedpan, or urinal?: A Little Help from another person bathing (including washing, rinsing, drying)?: A Little Help from another person to put on and taking off regular upper body clothing?: A Little Help from another person to put on and taking off regular lower body clothing?: A Little 6 Click Score: 19    End of Session Equipment Utilized During Treatment: Rolling walker  OT Visit Diagnosis: Unsteadiness on feet (R26.81);Other abnormalities of gait and mobility (R26.89);Muscle weakness (generalized) (M62.81);Pain;Other (comment) Pain - Right/Left: Right Pain - part of body: Hip;Knee   Activity Tolerance Patient tolerated treatment well   Patient Left in chair;with call bell/phone within reach   Nurse Communication Mobility status        Time: 7078-6754 OT Time Calculation (min): 36 min  Charges: OT General Charges $OT Visit: 1 Visit OT Treatments $Self Care/Home Management : 23-37 mins  Lodema Hong, OTA   Heather [name] 01/02/2021, 11:28 AM
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
## 2021-01-03 DIAGNOSIS — M793 Panniculitis, unspecified: Secondary | ICD-10-CM

## 2021-01-03 DIAGNOSIS — K219 Gastro-esophageal reflux disease without esophagitis: Secondary | ICD-10-CM

## 2021-01-03 DIAGNOSIS — F17200 Nicotine dependence, unspecified, uncomplicated: Secondary | ICD-10-CM

## 2021-01-03 DIAGNOSIS — N183 Chronic kidney disease, stage 3 unspecified: Secondary | ICD-10-CM | POA: Diagnosis not present

## 2021-01-03 DIAGNOSIS — J45909 Unspecified asthma, uncomplicated: Secondary | ICD-10-CM | POA: Diagnosis not present

## 2021-01-03 DIAGNOSIS — R002 Palpitations: Secondary | ICD-10-CM

## 2021-01-03 DIAGNOSIS — F411 Generalized anxiety disorder: Secondary | ICD-10-CM | POA: Diagnosis not present

## 2021-01-03 DIAGNOSIS — N390 Urinary tract infection, site not specified: Secondary | ICD-10-CM | POA: Diagnosis not present

## 2021-01-03 DIAGNOSIS — D75839 Thrombocytosis, unspecified: Secondary | ICD-10-CM

## 2021-01-03 NOTE — Plan of Care (Signed)
  Problem: Activity: Goal: Risk for activity intolerance will decrease Outcome: Progressing   
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
## 2021-01-03 NOTE — Plan of Care (Signed)
  Problem: Activity: Goal: Risk for activity intolerance will decrease Outcome: Progressing   Problem: Skin Integrity: Goal: Risk for impaired skin integrity will decrease Outcome: Progressing   
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
## 2021-01-03 NOTE — Plan of Care (Signed)
  Problem: Activity: Goal: Risk for activity intolerance will decrease Outcome: Progressing   Problem: Urinary Elimination: Goal: Signs and symptoms of infection will decrease Outcome: Progressing

## 2021-01-03 NOTE — Progress Notes (Addendum)
11:50am: CSW spoke with patient to provide her with update - patient states she spoke with [facility] representative who informed her that there were no bottom bunk beds available. Patient requested CSW contact [facility] in [place] for bed availability.  CSW reviewed [facility] website and serivces are only provided to women with custody of their children.  10:05am: CSW spoke with Freada Bergeron at [facility] who states the patient's bariatric needs will not be a barrier to the agency providing her with shelter. CSW provided [facility] with the patient's current level of functioning and informed her that the patient would be ready for discharge on Saturday.  CSW spoke with patient to inform of her that [facility] from [facility] would be contacting her - she was in agreement.  9am: CSW spoke with Wendelyn Breslow at [facility] who states there are no handicap beds available at this time - CSW was advised to call the agency every other day to check on bed availability.  CSW spoke with Network engineer at [facility] who states there are no beds available at this time.  CSW attempted to reach staff at [employer] [facility]) without success - a voicemail was left requesting a return call.  CSW spoke with staff at [facility] who states the facility only accommodates women who have children in their custody.  CSW attempted to reach staff at [facility] without success - a voicemail was left requesting a return call.  CSW attempted to reach [place] [facility] [employer] [facility] without success - a voicemail was left requesting a return call.  [name], MSW, LCSW Transitions of Care  Clinical Social Worker II [phone]

## 2021-01-03 NOTE — Progress Notes (Signed)
Physical Therapy Treatment Patient Details Name: Heather [name] MRN: [id number] DOB: June 21, 1975 Today's Date: 01/03/2021   History of Present Illness 45 y.o. female presented to ED for chest pain and palpitations and feeling like her throat was closing while at her behavioral health facility for SI. Admitted 10/31/20 for treatment of complicated UTI PMH: anxiety and depression (hx of bipolar and schizoaffective disorder in chart in past), ACD, panniculitis, debility,GERD,  OSA not on cpap, chronic back pain.    PT Comments    Pt supine in bed, after finishing lunch in bed. Pt shade is open and she is in a better mood today, agreeable to walking with therapy. Pt is limited by R knee pain and generalized weakness. Discussed with pt need for daily ambulation to keep her endurance. Pt only able to ambulate 100 feet without needing seated rest break today. Pt does report increasing R knee pain. Will trial ACE compression wrap with next PT session. Pt given LE exercise to do in seated every day to work on maintaining strength. DTP team continues to look for suitable d/c location. PT will continue to work on progressing and maintaining mobility.     Recommendations for follow up therapy are one component of a multi-disciplinary discharge planning process, led by the attending physician.  Recommendations may be updated based on patient status, additional functional criteria and insurance authorization.  Follow Up Recommendations  Other (comment);Supervision - Intermittent (ALF/Group Home)     Equipment Recommendations  None recommended by PT (owns bariatric RW)       Precautions / Restrictions Precautions Precautions: Fall Precaution Comments: fell prior to hosp Restrictions Weight Bearing Restrictions: No     Mobility  Bed Mobility Overal bed mobility: Needs Assistance       Supine to sit: Supervision Sit to supine: Supervision   General bed mobility comments: supervision for  safety. use of momentum to come to sitting    Transfers Overall transfer level: Needs assistance Equipment used: Rolling walker (2 wheeled) Transfers: Sit to/from Stand Sit to Stand: Min guard         General transfer comment: min guard for power up from bed x3 during session, pt with heavy reliance on L LE due to pain in R knee with extension  Ambulation/Gait Ambulation/Gait assistance: Min guard Gait Distance (Feet): 100 Feet (+80) Assistive device: Rolling walker (2 wheeled);None Gait Pattern/deviations: Trunk flexed;Decreased stance time - right;Decreased step length - left;Decreased dorsiflexion - right;Decreased weight shift to right Gait velocity: slowed Gait velocity interpretation: <1.31 ft/sec, indicative of household ambulator General Gait Details: pt reports increased R knee pain with distance ambulation, with increasing reliance on R UE as knee pain increases          Balance Overall balance assessment: Needs assistance Sitting-balance support: No upper extremity supported;Feet supported Sitting balance-Leahy Scale: Good     Standing balance support: No upper extremity supported Standing balance-Leahy Scale: Fair                              Cognition Arousal/Alertness: Awake/alert Behavior During Therapy: Flat affect   Area of Impairment: Safety/judgement;Problem solving                   Current Attention Level: Alternating     Safety/Judgement: Decreased awareness of safety Awareness: Intellectual Problem Solving: Requires verbal cues General Comments: pt mood better today, reports being apprehensive about leaving the hospital  Exercises General Exercises - Lower Extremity Long Arc Quad: AROM;Left;10 reps;Right;5 reps;Seated Hip Flexion/Marching: AROM;Both;10 reps;Seated (R LE with decreased amplitude)    General Comments General comments (skin integrity, edema, etc.): Pt with improved mood, educated on need to  ambulate daily as she has lost strength and endurance, with limited ambulation over the weakend, discussed need for LE exercises as well, pt reluctantly agrees.      Pertinent Vitals/Pain Pain Assessment: Faces Faces Pain Scale: Hurts little more Pain Location: R knee with long distance ambulation Pain Descriptors / Indicators: Grimacing;Aching Pain Intervention(s): Limited activity within patient's tolerance;Monitored during session;Repositioned     PT Goals (current goals can now be found in the care plan section) Acute Rehab PT Goals Patient Stated Goal: to reduce pain PT Goal Formulation: With patient Time For Goal Achievement: 12/12/20 Potential to Achieve Goals: Fair Progress towards PT goals: Progressing toward goals    Frequency    Min 3X/week      PT Plan Current plan remains appropriate       AM-PAC PT "6 Clicks" Mobility   Outcome Measure  Help needed turning from your back to your side while in a flat bed without using bedrails?: A Little Help needed moving from lying on your back to sitting on the side of a flat bed without using bedrails?: A Little Help needed moving to and from a bed to a chair (including a wheelchair)?: A Little Help needed standing up from a chair using your arms (e.g., wheelchair or bedside chair)?: A Little Help needed to walk in hospital room?: A Little Help needed climbing 3-5 steps with a railing? : A Lot 6 Click Score: 17    End of Session   Activity Tolerance: Patient tolerated treatment well;Patient limited by pain Patient left: with call bell/phone within reach;in bed;with family/visitor present (Mother in room) Nurse Communication: Mobility status PT Visit Diagnosis: Unsteadiness on feet (R26.81);Muscle weakness (generalized) (M62.81);History of falling (Z91.81) Pain - Right/Left: Right Pain - part of body: Leg (low back)     Time: 1275-1700 PT Time Calculation (min) (ACUTE ONLY): 24 min  Charges:  $Gait Training:  8-22 mins $Therapeutic Exercise: 8-22 mins                     Odesser Tourangeau B. Migdalia Dk PT, DPT Acute Rehabilitation Services Pager [phone] Office [phone]    [place] 01/03/2021, 1:45 PM

## 2021-01-04 ENCOUNTER — Other Ambulatory Visit (HOSPITAL_COMMUNITY): Payer: Self-pay

## 2021-01-04 ENCOUNTER — Inpatient Hospital Stay (HOSPITAL_COMMUNITY): Payer: Medicaid Other

## 2021-01-04 LAB — BASIC METABOLIC PANEL
Anion gap: 8 (ref 5–15)
BUN: 24 mg/dL — ABNORMAL HIGH (ref 6–20)
CO2: 25 mmol/L (ref 22–32)
Calcium: 9.4 mg/dL (ref 8.9–10.3)
Chloride: 104 mmol/L (ref 98–111)
Creatinine, Ser: 1.22 mg/dL — ABNORMAL HIGH (ref 0.44–1.00)
GFR, Estimated: 57 mL/min — ABNORMAL LOW (ref >60)
Glucose, Bld: 93 mg/dL (ref 70–99)
Potassium: 3.8 mmol/L (ref 3.5–5.1)
Sodium: 137 mmol/L (ref 135–145)

## 2021-01-04 MED ORDER — POTASSIUM CHLORIDE CRYS ER 20 MEQ PO TBCR
20.0000 meq | EXTENDED_RELEASE_TABLET | Freq: Every day | ORAL | 3 refills | Status: DC
Start: 2021-01-04 — End: 2022-11-12
  Filled 2021-01-04: qty 30, 30d supply, fill #0
  Filled 2021-06-27: qty 30, 30d supply, fill #1

## 2021-01-04 MED ORDER — ZIPRASIDONE HCL 20 MG PO CAPS
20.0000 mg | ORAL_CAPSULE | Freq: Every day | ORAL | 3 refills | Status: DC
  Filled 2021-01-04: qty 30, 30d supply, fill #0

## 2021-01-04 MED ORDER — CLONAZEPAM 0.5 MG PO TABS
0.2500 mg | ORAL_TABLET | Freq: Every day | ORAL | 0 refills | Status: DC | PRN
  Filled 2021-01-04: qty 15, 30d supply, fill #0

## 2021-01-04 MED ORDER — LIDOCAINE 5 % EX PTCH
1.0000 | MEDICATED_PATCH | CUTANEOUS | Status: DC
  Administered 2021-01-04 – 2021-01-05 (×2): 1 via TRANSDERMAL
  Filled 2021-01-04 (×2): qty 1

## 2021-01-04 MED ORDER — FUROSEMIDE 40 MG PO TABS
40.0000 mg | ORAL_TABLET | Freq: Every day | ORAL | 3 refills | Status: AC
  Filled 2021-01-04: qty 30, 30d supply, fill #0

## 2021-01-04 MED ORDER — SILVER SULFADIAZINE 1 % EX CREA
TOPICAL_CREAM | Freq: Every day | CUTANEOUS | 0 refills | Status: DC
  Filled 2021-01-04: qty 85, 14d supply, fill #0

## 2021-01-04 MED ORDER — BUPROPION HCL ER (XL) 300 MG PO TB24
300.0000 mg | ORAL_TABLET | Freq: Every day | ORAL | 3 refills | Status: DC
  Filled 2021-01-04: qty 30, 30d supply, fill #0

## 2021-01-04 NOTE — Progress Notes (Signed)
Occupational Therapy Treatment Patient Details Name: Heather [name] MRN: [id number] DOB: [birth year] Today's Date: 01/04/2021   History of present illness 45 y.o. female presented to ED for chest pain and palpitations and feeling like her throat was closing while at her behavioral health facility for SI. Admitted 10/31/20 for treatment of complicated UTI PMH: anxiety and depression (hx of bipolar and schizoaffective disorder in chart in past), ACD, panniculitis, debility,GERD,  OSA not on cpap, chronic back pain.   OT comments  Pt lying in bed with boyfriend on entry to room. Progressing toward all goals/goals updated. Discussed POC with COTA. Encourage pt to work on her own set up of ADL tasks with use of AE and DME and item transport @ RW level. Increased complaints of R knee pain - discussed possible use of lidocain pain patch with NP. Will continue to follow acutely to facilitate DC.   Recommendations for follow up therapy are one component of a multi-disciplinary discharge planning process, led by the attending physician.  Recommendations may be updated based on patient status, additional functional criteria and insurance authorization.    Follow Up Recommendations       Equipment Recommendations  3 in 1 bedside commode;Tub/shower bench;Other (comment)    Recommendations for Other Services Other (comment) (housing situation being addressed by DTP team)    Precautions / Restrictions Precautions Precautions: Fall Precaution Comments: R knee pain       Mobility Bed Mobility Overal bed mobility: Modified Independent                  Transfers Overall transfer level: Needs assistance   Transfers: Sit to/from Stand Sit to Stand: Supervision              Balance Overall balance assessment: Needs assistance   Sitting balance-Leahy Scale: Good       Standing balance-Leahy Scale: Fair                             ADL either performed or assessed  with clinical judgement   ADL Overall ADL's : Needs assistance/impaired     Grooming: Set up   Upper Body Bathing: Set up Upper Body Bathing Details (indicate cue type and reason): "I can't do my back". Explained that she needs to use the long handled sponge for her back. States she doesn't really like to do that. Lower Body Bathing: Minimal assistance Lower Body Bathing Details (indicate cue type and reason): difficulty with pericare. Has 12 in toilet aid. May need longer toilet aid Upper Body Dressing : Set up   Lower Body Dressing: Set up;Supervision/safety;Sit to/from stand   Toilet Transfer: Supervision/safety;Ambulation Toilet Transfer Details (indicate cue type and reason): ambulated to toilet without RW; needed RW to walk back to recliner due to R knee pain Toileting- Clothing Manipulation and Hygiene: Minimal assistance Toileting - Clothing Manipulation Details (indicate cue type and reason): difficulty with complete and thorough pericare     Functional mobility during ADLs: Supervision/safety;Rolling walker General ADL Comments: REquires encouragement to do tasks independently wtih use of AE     Vision       Perception     Praxis      Cognition Arousal/Alertness: Awake/alert Behavior During Therapy: Anxious;Flat affect Overall Cognitive Status: No family/caregiver present to determine baseline cognitive functioning  General Comments: most likely baseline cognition; pt anxious about having an unfamiliar therapist; diffiuclty understnading why a different therapist was seesing her today. States "I'm use to having eerything set up for me". Educated on need for her to work on set up herself as she will be doing that once she is dicharged.        Exercises     Shoulder Instructions       General Comments Boyfriend lying in bed with her on entry to room    Pertinent Vitals/ Pain       Pain Assessment: Faces Faces  Pain Scale: Hurts little more Pain Location: R knee Pain Descriptors / Indicators: Aching;Crying;Discomfort Pain Intervention(s): Patient requesting pain meds-RN notified;Other (comment) (discussed possible use of Lidocain pain patch with NP)  Home Living                                          Prior Functioning/Environment              Frequency  Min 2X/week        Progress Toward Goals  OT Goals(current goals can now be found in the care plan section)  Progress towards OT goals: Progressing toward goals (goals updated)  Acute Rehab OT Goals Patient Stated Goal: to reduce pain OT Goal Formulation: With patient Time For Goal Achievement: 01/18/21 Potential to Achieve Goals: Good ADL Goals Pt Will Perform Grooming: with modified independence;standing Pt Will Perform Lower Body Bathing: with modified independence;sit to/from stand;with adaptive equipment Pt Will Perform Lower Body Dressing: with modified independence;sit to/from stand;with adaptive equipment Pt Will Transfer to Toilet: with modified independence;ambulating Pt Will Perform Toileting - Clothing Manipulation and hygiene: with modified independence;with adaptive equipment;sit to/from stand Pt Will Perform Tub/Shower Transfer: Shower transfer;3 in 1;ambulating;rolling walker Additional ADL Goal #1: Pt will independently collect items for ADL task at RW level  Plan Discharge plan remains appropriate    Co-evaluation                 AM-PAC OT "6 Clicks" Daily Activity     Outcome Measure   Help from another person eating meals?: None Help from another person taking care of personal grooming?: A Little Help from another person toileting, which includes using toliet, bedpan, or urinal?: A Little Help from another person bathing (including washing, rinsing, drying)?: A Little Help from another person to put on and taking off regular upper body clothing?: A Little Help from another  person to put on and taking off regular lower body clothing?: A Little 6 Click Score: 19    End of Session Equipment Utilized During Treatment: Rolling walker  OT Visit Diagnosis: Unsteadiness on feet (R26.81);Other abnormalities of gait and mobility (R26.89);Muscle weakness (generalized) (M62.81);Pain;Other (comment) Pain - Right/Left: Right Pain - part of body: Knee   Activity Tolerance Patient tolerated treatment well   Patient Left in chair;with call bell/phone within reach   Nurse Communication Mobility status;Other (comment);Patient requests pain meds (R knee pain)        Time: 7846-9629 OT Time Calculation (min): 35 min  Charges: OT General Charges $OT Visit: 1 Visit OT Treatments $Self Care/Home Management : 23-37 mins  Maurie Boettcher, OT/L   Acute OT Clinical Specialist [place] Pager [phone] Office [phone]   [facility] 01/04/2021, 10:41 AM

## 2021-01-04 NOTE — Progress Notes (Signed)
Pharmacy Antibiotic Note  Heather [name] is a 45 y.o. female admitted on 10/30/2020.  Pt with prolonged hospital stay awaiting safe disposition.  Pt with new complaints of back pain, dysuria, and suprapubic discomfort.  Pharmacy was consulted on 12/29/20 to start Cefepime for UTI.  Pt has a hx of  resistant Enterobacter UTI.    9/22 urine culture grew enterobacter cloacae (R cefazolin, S Cefepime), 20,000 colonies proteus mirabalis (nearly pan-sensitive); both sensitive to cefepime.   Continue Cefepime.   WBC down to 11.4k on 12/30/20. SCr 1.22 stable CrCl  ~100 ml/min.  Otis Dials NP noted 9/29 that patient will complete Cefepime dosing late Friday 9/30 and will be eligible for discharge on Saturday 10/1. I have entered stop date for Cefepime to complete 7 days of therapy.   Plan: Continue Cefepime 2 g IV every 8 hours through 01/05/21 PM to complete 7 days of cefepime as noted per attending MD's note.   Follow-up culture data and clinical progress.  Height: 5\' 3"  (160 cm) Weight: (!) 197.8 kg (436 lb 1.1 oz) IBW/kg (Calculated) : 52.4  Temp (24hrs), Avg:98.5 F (36.9 C), Min:98.3 F (36.8 C), Max:98.7 F (37.1 C)  Recent Labs  Lab 12/29/20 0111 12/30/20 0415 01/01/21 1111 01/02/21 0317 01/04/21 0757  WBC 13.0* 11.4*  --   --   --   CREATININE  --  1.33* 1.11* 1.24* 1.22*     Estimated Creatinine Clearance: 104.9 mL/min (A) (by C-G formula based on SCr of 1.22 mg/dL (H)).    Allergies  Allergen Reactions   Amoxicillin Anaphylaxis   Bee Venom Shortness Of Breath   Penicillins Anaphylaxis    Tolerates cephalexin and ceftriaxone Has patient had a PCN reaction causing immediate rash, facial/tongue/throat swelling, SOB or lightheadedness with hypotension: No Has patient had a PCN reaction causing severe rash involving mucus membranes or skin necrosis: No Has patient had a PCN reaction that required hospitalization No Has patient had a PCN reaction occurring within the last 10  years: No If all of the above answers are "NO", then may proceed with Cephalosporin use.'  REACTION: Angioedema   Penicillin G    Adhesive [Tape] Rash   Latex Rash   Vancomycin Other (See Comments)    Pt can tolerate Vancomycin but did cause Vancomycin Infusion Reaction.  Recommend to pre-medicate with Benadryl before doses administered.      Antimicrobials this admission: ertapenem x1 on 7/26 cefepime 7/27 >> 7/29 doxycycline 9/11 >> 9/18 Cefepime 9/23 >>  Dose adjustments this admission:  Microbiology results: Ucx 7/22 (care everywhere): >100,000 Entereobacter cloacae 9/22 UCx:  30,000 colonies enterobacter cloacae (R cefazolin, S Cefepime), 20,000 colonies proteus mirabalis (nearly pan-sensitive)    Thank you for allowing pharmacy to be a part of this patient's care. Nicole Cella, RPh Clinical Pharmacist [phone]  **Pharmacist phone directory can be found on [email or web address] listed under [place].  01/04/2021 1:50 PM

## 2021-01-04 NOTE — Plan of Care (Signed)
  Problem: Activity: Goal: Risk for activity intolerance will decrease Outcome: Progressing   

## 2021-01-04 NOTE — Progress Notes (Addendum)
12:15pm: CSW spoke with patient and her female friend Heather [name] at bedside to discuss attempts to find shelter placement for her. CSW offered to discharge patient to discharge patient to [facility] but she refused. Patient states she has funds to get a hotel room for a few nights. Patient's friend attempted to interrupt conversation several times to demand CSW make additional contacts with local resources that have already been utilized.   CSW informed NP that patient has funds to discharge to a hotel on Saturday after she completes her IV antibiotics on Friday evening.  10:30am: CSW attempted to reach patient's pastor Heather [name] without success - a voicemail was left requesting a return call.  CSW spoke with Heather [name] of [facility] to discuss discharge plan for patient. Heather [name] states there is no funding available for [facility] at this time.   [name], MSW, LCSW Transitions of Care  Clinical Social Worker II [phone]

## 2021-01-04 NOTE — Plan of Care (Signed)
  Problem: Activity: Goal: Risk for activity intolerance will decrease Outcome: Progressing   Problem: Skin Integrity: Goal: Risk for impaired skin integrity will decrease Outcome: Progressing   
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
## 2021-01-05 ENCOUNTER — Other Ambulatory Visit (HOSPITAL_COMMUNITY): Payer: Self-pay

## 2021-01-05 NOTE — Progress Notes (Addendum)
DISCHARGE NOTE HOME Nathalia Wismer Devera to be discharged Home per MD order. Discussed prescriptions and follow up appointments with the patient. Prescriptions given to patient; medication list explained in detail. Patient verbalized understanding.  Skin clean, dry and intact without evidence of skin break down, no evidence of skin tears noted. IV catheter discontinued intact. Site without signs and symptoms of complications. Dressing and pressure applied. Pt denies pain at the site currently. No complaints noted.  Patient free of lines, drains, and wounds.   An After Visit Summary (AVS) was printed and given to the patient. Patient escorted via wheelchair, and discharged home via private auto.  Abbigayle Toole S Caroljean Monsivais, RN

## 2021-01-05 NOTE — Progress Notes (Signed)
Physical Therapy Treatment Patient Details Name: Heather [name] MRN: [id number] DOB: Nov 02, 1975 Today's Date: 01/05/2021   History of Present Illness 45 y.o. female presented to ED for chest pain and palpitations and feeling like her throat was closing while at her behavioral health facility for SI. Admitted 10/31/20 for treatment of complicated UTI PMH: anxiety and depression (hx of bipolar and schizoaffective disorder in chart in past), ACD, panniculitis, debility,GERD,  OSA not on cpap, chronic back pain.    PT Comments    Pt and boyfriend in room calling local churches for resources given imminent discharge this afternoon after last IV antibiotics administered. Pt very distraught but agreeable to having knee wrapped with ACE bandage in an attempt to provide increased support to knee for mobility given degenerative R knee joint revealed during most recent imaging. Pt also has lidocaine patch in place. Pt is min guard for bed mobility, transfers and ambulation with RW and reports some relief from ACE wrap. Educated pt on need for more ambulation to build up LE muscles and endurance to be able to be more independent. Pt reports it doesn't matter if she can move if she has no money. Continue to endorse more movement and mobility.      Recommendations for follow up therapy are one component of a multi-disciplinary discharge planning process, led by the attending physician.  Recommendations may be updated based on patient status, additional functional criteria and insurance authorization.  Follow Up Recommendations  Other (comment);Supervision - Intermittent (ALF/Group Home)     Equipment Recommendations  None recommended by PT (owns bariatric RW)       Precautions / Restrictions Precautions Precautions: Fall Precaution Comments: fell prior to hosp Restrictions Weight Bearing Restrictions: No     Mobility  Bed Mobility Overal bed mobility: Needs Assistance       Supine to sit:  Supervision Sit to supine: Supervision   General bed mobility comments: supervision for safety. heavy use of momentum and bedrails to come to sitting    Transfers Overall transfer level: Needs assistance Equipment used: Rolling walker (2 wheeled) Transfers: Sit to/from Stand Sit to Stand: Min guard         General transfer comment: min guard for power up from bed pt with heavy reliance on L LE due to pain in R knee with extension  Ambulation/Gait Ambulation/Gait assistance: Min guard Gait Distance (Feet): 80 Feet Assistive device: Rolling walker (2 wheeled);None Gait Pattern/deviations: Trunk flexed;Decreased stance time - right;Decreased step length - left;Decreased dorsiflexion - right;Decreased weight shift to right Gait velocity: slowed Gait velocity interpretation: <1.31 ft/sec, indicative of household ambulator General Gait Details: pt reports that R knee still hurts but feels a little better with ACE wrap, only wants to walk to nurses station and back         Balance Overall balance assessment: Needs assistance Sitting-balance support: No upper extremity supported;Feet supported Sitting balance-Leahy Scale: Good     Standing balance support: No upper extremity supported Standing balance-Leahy Scale: Fair                              Cognition Arousal/Alertness: Awake/alert Behavior During Therapy: Flat affect   Area of Impairment: Safety/judgement;Problem solving                   Current Attention Level: Alternating     Safety/Judgement: Decreased awareness of safety Awareness: Intellectual Problem Solving: Requires verbal cues General Comments: pt  very upset that she is being discharged today, does not feel like she will be able to manage alone      Exercises General Exercises - Lower Extremity Long Arc Quad: AROM;Left;10 reps;Right;5 reps;Seated Hip Flexion/Marching: AROM;Both;10 reps;Seated (R LE with decreased amplitude)     General Comments General comments (skin integrity, edema, etc.): Boyfriend in room attempting to call local churches for resources, voices concern over discharge      Pertinent Vitals/Pain Pain Assessment: Faces Faces Pain Scale: Hurts little more Pain Location: R knee with long distance ambulation Pain Descriptors / Indicators: Grimacing;Aching Pain Intervention(s): Limited activity within patient's tolerance;Monitored during session;Premedicated before session;Repositioned;Other (comment) (lidocaine patch in place, ACE wrap utilized to fortify knee)     PT Goals (current goals can now be found in the care plan section) Acute Rehab PT Goals Patient Stated Goal: to reduce pain PT Goal Formulation: With patient Time For Goal Achievement: 12/12/20 Potential to Achieve Goals: Fair Progress towards PT goals: Progressing toward goals    Frequency    Min 3X/week      PT Plan Current plan remains appropriate       AM-PAC PT "6 Clicks" Mobility   Outcome Measure  Help needed turning from your back to your side while in a flat bed without using bedrails?: A Little Help needed moving from lying on your back to sitting on the side of a flat bed without using bedrails?: A Little Help needed moving to and from a bed to a chair (including a wheelchair)?: A Little Help needed standing up from a chair using your arms (e.g., wheelchair or bedside chair)?: A Little Help needed to walk in hospital room?: A Little Help needed climbing 3-5 steps with a railing? : A Lot 6 Click Score: 17    End of Session   Activity Tolerance: Patient tolerated treatment well;Patient limited by pain Patient left: with call bell/phone within reach;in bed;with family/visitor present (boyfriend in room) Nurse Communication: Mobility status PT Visit Diagnosis: Unsteadiness on feet (R26.81);Muscle weakness (generalized) (M62.81);History of falling (Z91.81) Pain - Right/Left: Right Pain - part of body: Leg  (low back)     Time: 2409-7353 PT Time Calculation (min) (ACUTE ONLY): 25 min  Charges:  $Gait Training: 8-22 mins $Therapeutic Activity: 8-22 mins                     Inez Stantz B. Migdalia Dk PT, DPT Acute Rehabilitation Services Pager [phone] Office [phone]    [place] 01/05/2021, 1:53 PM

## 2021-01-05 NOTE — Discharge Summary (Signed)
Physician Discharge Summary  Heather [name] FFM:[id number] DOB: 29-Sep-1975 DOA: 10/30/2020  PCP: Lucianne Lei, MD  Admit date: 10/30/2020 Discharge date: 01/05/2021  Time spent: 45 minutes  Recommendations for Outpatient Follow-up:  Patient will discharge today.  Since she is homeless she has been instructed to go to [facility] [place] to help assist with lodging.  She will be given a cab voucher prior to discharge Patient was found to have nonobstructing nephrolithiasis as well as a renal cyst.  An ambulatory referral was sent to Dr. Virgina Norfolk office with urology.They have been provided the patient's cell phone number as well as Jemal cell phone number.  This will allow office to contact patient regarding follow-up appointment and time. Since patient is discharging as homeless with no definite address other than [facility] she is not eligible for home health services.  She does have a small wound on her left hip that requires application of Santyl and coverage with a dry dressing.  Patient does have a significant other who will be staying with her and is also homeless and he can assist with wound care.  Patient will be given supplies prior to discharge Patient needs to follow-up with Waylan Boga, NP who is her primary psychiatric provider.  These note that her Wellbutrin SR has been changed to Wellbutrin XL 300 daily and she was started on Geodon during this hospitalization.  Her Klonopin was decreased to once daily as needed   Discharge Diagnoses:  Principal Problem:   Complicated UTI (urinary tract infection) Active Problems:   Asthma   PALPITATIONS, OCCASIONAL   Panniculitis   OSA (obstructive sleep apnea)   Major depressive disorder, recurrent episode, moderate (HCC)   Generalized anxiety disorder   Major depression, chronic   Thrombocytosis   Hypokalemia   CKD (chronic kidney disease), stage III [facility])    Discharge Condition: Stable  Diet recommendation: Heart healthy or regular diet after  discharge  Filed Weights   12/28/20 2053 12/29/20 2059 01/01/21 2057  Weight: (!) 198.4 kg (!) 192.1 kg (!) 197.8 kg    History of present illness:  45 y.o. female with PMH significant for morbid obesity, DM2, obstructive sleep apnea, COPD, MDD, GAD, insomnia and RLS,renal mass,  and numerous small wounds under her pannus, upper thighs and between her legs.   Patient presented to the ED on 7/25 presented with right flank and suprapubic pain along with nausea and vomiting.   She was admitted and treated for complicated UTI with antibiotics and has completed treatment.  Since arrival patient has been treated for physical deconditioning "PT/OT sessions and by date of discharge was ambulating easily in the hallways and up to the nursing station with a rolling walker.  She did have issues with suicidal ideation this hospitalization and was evaluated by the psychiatry team with several medication changes made.  She will need to follow-up with her primary outpatient psychiatric provider Waylan Boga, NP.  Of note patient typically has multidrug-resistant UTIs which require IV antibiotics.  Keep this in mind she presents with UTI symptoms.  Because of nephrolithiasis and renal cyst she has been referred to a urologist and they have been provided with both her and her significant other cell phone numbers  Initially because of deconditioning we felt that the patient would be appropriate for an ALF or SNF for short-term rehab.  Unfortunately she has no payer source.  She does not have disability and her Medicaid is only community and does not pay for SNF rehabilitation.  We  have been working aggressively with this patient and she is now able to ambulate without difficulty.  We have been concerned about discharging her to [facility] which would mean basically living on the streets because of her obesity and limited mobility (i.e. unclear if she would be able to get up and down off the ground to sleep in a sleeping bag in  a tent).  TOC team spent quite a bit of time calling homeless shelters not only in the [facility] area but out of [place] without any success locating a bed given the fact that patient needs a first-floor bed due to her obesity and inability to navigate stairs well with a walker.  Patient will complete her antibiotics in the afternoon of 9/30 and since she has been medically stable greater than 3 weeks (other than 1 week requirement for IV antibiotics for recurrent UTI) the fact we have not been able to locate homeless shelter for her unfortunately we we will need to discharge her to [facility].  She will be provided with a cab voucher prior to discharge  Hospital Course:  Complicated UTI presumed 2/2 Enterobacter colonization -Patient completed 3 days of cefepime, urine culture colonized   Acute Proteus and Enterobacter UTI -Urine culture positive for Proteus as well as Enterobacter -patient was symptomatic with suprapubic pain and dysuria as well as fever and leukocytosis -Continue cefepime with pharmacy dosing -Antibiotics administered every 8 hours and she will complete dosing late Friday and will be eligible for discharge Saturday   Suicidal ideation/anxiety and depression -Psychiatry has cleared the patient for discharge.  OP FU rec -Reevaluated by psych on 9/27.  Deemed to not be suicidal.  Her Wellbutrin SR 200 mg was changed to Wellbutrin XL 300 mg daily.  Geodon was initiated for mood stabilization and Klonopin dosage was reduced to once daily available; recommendation is to remove schizophrenia diagnosis from problem list given she does not meet clinical criteria for this diagnosis -Continue trazodone   Burn to left thigh -Evaluated by wound care and they have ordered Silvadene to the area daily with nonadherent dressing. -Have asked staff to teach patient's boyfriend how to dress wound   Nonobstructing nephrolithiasis Renal cyst -Imaging this admission revealed bilateral  nonobstructing renal calculi with a staghorn calculus on the left.  There was also noted to be a 8.3 cm benign-appearing right upper pole cyst.  Recommendation is to follow-up with urology as an outpatient. -Recent follow-up renal ultrasound unchanged from previous imaging -An ambulatory referral was sent to Dr. Virgina Norfolk office.  They have been provided the patient's cell phone number as well as Jemal cell phone number.  This will allow office to contact patient regarding follow-up appointment and time.   Mild pedal edema -Continue Lasix 40 mg daily along with 20 mEq of potassium   Suspected OSA/OHS -Patient with O2 saturations between 90 and 94% while sleeping -Recommend outpatient sleep study   Right hip/back/leg pain and patient with known disc herniation on imaging -Back pain (chronic).  Continue outpatient pain medications (Topamax, meloxicam and Robaxin).  -Continue Lyrica and Oxy IR   Asthma -Continue DuoNebs as needed -Continue Dulera as well as Umeclidinium (nonformulary for Medicaid and requires prior approval); continue Singulair   CKD 3B -Used to be on indapamide in the past.   --Creatinine stable with GFR less than 60 and with resumption of meloxicam creatinine stable at 1.38 with previous reading 1.25   Generalized deconditioning Homelessness -PT/OT initially recommended SNF.  History recent therapy sessions  demonstrate increased independence with mobility when utilizing rolling walker. -Primary barriers to ambulation are patient's motivation and ongoing back pain which is being treated as above as well as her known thoracic and lumbar disc herniation causing chronic pain which is compounded by her morbid obesity -Disability has been applied for but had not yet been approved by the time of discharge   Thoracic and lumbar spine disc herniation -Has difficulty with mobility and chronic pain secondary to disc herniation which is significantly compounded by patient's profound  morbid obesity -Because of chronic pain from this condition patient has not been able to obtain appropriate employment as the jobs she is qualified for require her to walk, stand or lift which she is unable to do   Recurrent right knee pain -4 view knee x-ray completed on 9/29 is negative for fracture only demonstrated advanced tricompartmental degenerative change which is difficult to treat given patient's morbid obesity -Have initiated lidocaine patch to knee -Given massive size of lower extremities unable to safely apply supportive device   Cystic acne -Resolved and has completed a course of doxycycline    Yeast panniculitis -Continue topical antifungal   Morbid obesity  -Body mass index is 76.53 kg/m. Patient has been advised to make an attempt to improve diet and exercise patterns to aid in weight loss.   Generalized anxiety disorder/restless legs -Continue Wellbutrin, BuSpar, Neurontin, Requip.    Procedures: None  Consultations: Psychiatry  Discharge Exam: Vitals:   01/05/21 0808 01/05/21 0944  BP:  125/79  Pulse:  84  Resp:  16  Temp:  98.8 F (37.1 C)  SpO2: 97% 99%   Constitutional: NAD, awake and anxious regarding impending discharge-she and her boyfriend have been quoting information given to them previously and she recently texted the social worker stating she needed respite care and she also stated that OT and PT told her she had cognitive deficits Respiratory: Posterior lung sounds clear, no increased work of breathing, no wheezing, stable on room air Cardiovascular: S1-S2, normotensive.  Pulse remains regular Abdomen: Soft and nontender.  Obese.  Bowel sounds positive. LBM 9/28 Skin: Burn wound on left hip with dry dressing in place Musculoskeletal: Right knee unremarkable in appearance, no redness, no pain with palpation, no effusion Neurologic: CN 2-12 grossly intact. Sensation intact, Strength 4/5 x all 4 extremities.  Psychiatric: Alert and oriented x  3.  Mood significantly improved today   Discharge Instructions   Discharge Instructions     Ambulatory referral to Urology   Complete by: As directed    This will be a new patient evaluation.  Patient has a history of nonobstructive urolithiasis with ESBL Enterobacter UTI.  She also has a renal cyst that appears to be benign.  Of note patient is homeless will be discharging to a hotel room.  Her cell phone number is [phone].  She has a friend that will be staying with her: Janne Lab telephone number [phone]   Call MD for:  persistant dizziness or light-headedness   Complete by: As directed    Call MD for:  redness, tenderness, or signs of infection (pain, swelling, redness, odor or green/yellow discharge around incision site)   Complete by: As directed    Call MD for:  temperature >100.4   Complete by: As directed    Diet general   Complete by: As directed    Discharge instructions   Complete by: As directed    1)Take prescribed medications as instructed 2)Follow up with urology as an  outpatient for further management of  renal stone   Discharge instructions   Complete by: As directed    Please call Waylan Boga regarding follow-up for psychiatric issues  Dr. Abner Greenspan with urology will be seeing you in the future.  His office has been made aware that you need an appointment and they have both your and your significant others phone numbers.  Take all medications as prescribed  Please have your significant other assist you with daily wound care to the hip wound  Make sure you ambulate with a walker until you are strong enough to walk without it  You may use Tylenol for pain.  Since you are on Mobic do not take any other NSAIDs such as ibuprofen or Advil   Discharge wound care:   Complete by: As directed    Please apply Silvadene to left thigh burn wound daily, cover with nonstick dressing and secure with tape.  Please remove previous cream with moist gauze and pat dry  before applying new dressing   Increase activity slowly   Complete by: As directed    Increase activity slowly   Complete by: As directed       Allergies as of 01/05/2021       Reactions   Amoxicillin Anaphylaxis   Bee Venom Shortness Of Breath   Penicillins Anaphylaxis   Tolerates cephalexin and ceftriaxone Has patient had a PCN reaction causing immediate rash, facial/tongue/throat swelling, SOB or lightheadedness with hypotension: No Has patient had a PCN reaction causing severe rash involving mucus membranes or skin necrosis: No Has patient had a PCN reaction that required hospitalization No Has patient had a PCN reaction occurring within the last 10 years: No If all of the above answers are "NO", then may proceed with Cephalosporin use.' REACTION: Angioedema   Penicillin G    Adhesive [tape] Rash   Latex Rash   Vancomycin Other (See Comments)   Pt can tolerate Vancomycin but did cause Vancomycin Infusion Reaction.  Recommend to pre-medicate with Benadryl before doses administered.          Medication List     STOP taking these medications    buPROPion ER 100 MG 12 hr tablet Commonly known as: Wellbutrin SR Replaced by: buPROPion 300 MG 24 hr tablet   busPIRone 30 MG tablet Commonly known as: BUSPAR   Flovent HFA 110 MCG/ACT inhaler Generic drug: fluticasone   gabapentin 100 MG capsule Commonly known as: Neurontin   tamsulosin 0.4 MG Caps capsule Commonly known as: FLOMAX   Vitamin B-12 5000 MCG Tbdp Replaced by: vitamin B-12 1000 MCG tablet   Zinc Sulfate 220 (50 Zn) MG Tabs       TAKE these medications    acetaminophen 325 MG tablet Commonly known as: TYLENOL Take 650 mg by mouth every 6 (six) hours as needed for mild pain, fever or headache.   albuterol 108 (90 Base) MCG/ACT inhaler Commonly known as: VENTOLIN HFA Inhale 2 puffs into the lungs every 4 (four) hours as needed for wheezing or shortness of breath.   ascorbic acid 500 MG  tablet Commonly known as: VITAMIN C Take 1 tablet (500 mg total) by mouth daily.   buPROPion 300 MG 24 hr tablet Commonly known as: WELLBUTRIN XL Take 1 tablet (300 mg total) by mouth daily. Replaces: buPROPion ER 100 MG 12 hr tablet   clonazePAM 0.5 MG tablet Commonly known as: KLONOPIN Take 1/2 tablet (0.25 mg total) by mouth daily as needed (panic attacks).  clotrimazole 1 % cream Commonly known as: LOTRIMIN Apply topically 2 (two) times daily.   diclofenac Sodium 1 % Gel Commonly known as: VOLTAREN Apply 4 g topically 4 (four) times daily. What changed: how much to take   fluticasone 50 MCG/ACT nasal spray Commonly known as: FLONASE Place 2 sprays into both nostrils 2 (two) times daily.   furosemide 40 MG tablet Commonly known as: LASIX Take 1 tablet (40 mg total) by mouth daily.   methocarbamol 500 MG tablet Commonly known as: ROBAXIN Take 1 tablet (500 mg total) by mouth 4 (four) times daily. What changed:  when to take this reasons to take this   mometasone-formoterol 100-5 MCG/ACT Aero Commonly known as: DULERA Inhale 2 puffs into the lungs 2 (two) times daily.   montelukast 10 MG tablet Commonly known as: SINGULAIR Take 1 tablet (10 mg total) by mouth at bedtime.   nystatin powder Commonly known as: MYCOSTATIN/NYSTOP Apply topically 3 (three) times daily.   oxyCODONE 5 MG immediate release tablet Commonly known as: Oxy IR/ROXICODONE Take 1 tablet (5 mg total) by mouth every 4 (four) hours as needed for moderate pain.   potassium chloride SA 20 MEQ tablet Commonly known as: KLOR-CON Take 1 tablet (20 mEq total) by mouth daily.   pregabalin 100 MG capsule Commonly known as: LYRICA Take 1 capsule (100 mg total) by mouth 3 (three) times daily.   rOPINIRole 4 MG tablet Commonly known as: REQUIP Take 1 tablet (4 mg total) by mouth at bedtime.   senna-docusate 8.6-50 MG tablet Commonly known as: Senokot-S Take 2 tablets by mouth daily.   silver  sulfADIAZINE 1 % cream Commonly known as: SILVADENE Apply topically daily.   topiramate 25 MG tablet Commonly known as: TOPAMAX Take 1 tablet (25 mg total) by mouth 2 (two) times daily.   traMADol 50 MG tablet Commonly known as: ULTRAM Take 1 tablet (50 mg total) by mouth every 6 (six) hours as needed for severe pain.   traZODone 50 MG tablet Commonly known as: DESYREL Take 1 tablet (50 mg total) by mouth at bedtime. What changed: how much to take   vitamin B-12 1000 MCG tablet Commonly known as: CYANOCOBALAMIN Take 1 tablet (1,000 mcg total) by mouth daily. Replaces: Vitamin B-12 5000 MCG Tbdp   ziprasidone 20 MG capsule Commonly known as: GEODON Take 1 capsule (20 mg total) by mouth daily.               Discharge Care Instructions  (From admission, onward)           Start     Ordered   01/05/21 0000  Discharge wound care:       Comments: Please apply Silvadene to left thigh burn wound daily, cover with nonstick dressing and secure with tape.  Please remove previous cream with moist gauze and pat dry before applying new dressing   01/05/21 1232           Allergies  Allergen Reactions   Amoxicillin Anaphylaxis   Bee Venom Shortness Of Breath   Penicillins Anaphylaxis    Tolerates cephalexin and ceftriaxone Has patient had a PCN reaction causing immediate rash, facial/tongue/throat swelling, SOB or lightheadedness with hypotension: No Has patient had a PCN reaction causing severe rash involving mucus membranes or skin necrosis: No Has patient had a PCN reaction that required hospitalization No Has patient had a PCN reaction occurring within the last 10 years: No If all of the above answers are "NO", then may  proceed with Cephalosporin use.'  REACTION: Angioedema   Penicillin G    Adhesive [Tape] Rash   Latex Rash   Vancomycin Other (See Comments)    Pt can tolerate Vancomycin but did cause Vancomycin Infusion Reaction.  Recommend to pre-medicate with  Benadryl before doses administered.      Follow-up Information     Lucianne Lei, MD. Schedule an appointment as soon as possible for a visit.   Specialty: Family Medicine Why: Please make an appointment in the next 7-10 days after you are discharged home from the hospital. Contact information: [address] [place] [address] [phone]         Patrecia Pour, NP. Call.   Specialty: Psychiatry Why: Please call to make an appointment as soon as possible after discharge regarding treatment of your underlying psychiatric conditions and any needed adjustments in medications Contact information: [facility] [place] [address] [phone]         Patrecia Pour, NP .   Specialty: Psychiatry Contact information: [place] [address] [phone]                  The results of significant diagnostics from this hospitalization (including imaging, microbiology, ancillary and laboratory) are listed below for reference.    Significant Diagnostic Studies: MR LUMBAR SPINE WO CONTRAST  Result Date: 12/12/2020 CLINICAL DATA:  Low back pain, known prior T12 and lumbar disc herniation EXAM: MRI LUMBAR SPINE WITHOUT CONTRAST TECHNIQUE: Multiplanar, multisequence MR imaging of the lumbar spine was performed. No intravenous contrast was administered. COMPARISON:  Lumbar spine MRI 11/04/2018, CT lumbar spine 08/18/2020 FINDINGS: Segmentation:  Standard. Alignment: Grade 1 anterolisthesis of L5 on S1 is unchanged. Alignment is otherwise normal. Vertebrae: Vertebral body heights are preserved. There is no evidence of acute fracture. Marrow signal is diffusely hypointense on T1 and T2 images. There is mild degenerative endplate signal abnormality at L4-L5 and L5-S1. There is a suspected unilateral right L5-S1 pars defect as seen on prior CT. Conus medullaris and cauda equina: Conus extends to the mid L1 level.  Conus and cauda equina appear normal. Paraspinal and other soft tissues: There is a 7.8 cm right renal cyst with complex internal septations. This is not significantly changed in size since 2019. The soft tissues are otherwise unremarkable. Disc levels: There is multilevel facet arthropathy, most advanced at L5-S1 with prominent bilateral effusions, increased since 2020. There is multilevel disc desiccation and narrowing, most advanced at L4-L5. T12-L1: There is a right paracentral disc extrusion with superior migration without significant spinal canal or neural foraminal stenosis. The extrusion is decreased in size since 2020. L1-L2: There is a mild disc bulge and bilateral facet arthropathy without significant spinal canal or neural foraminal stenosis. L2-L3: There is a diffuse disc bulge with a left subarticular zone inferiorly migrated extrusion and bilateral facet arthropathy resulting in moderate to severe spinal canal stenosis with probable impingement of the traversing left cauda equina nerve roots. These findings are not significantly changed since 2020. L3-L4: There is a shallow disc bulge and bilateral facet arthropathy without significant spinal canal or neural foraminal stenosis. L4-L5: There is a mild disc bulge, degenerative endplate change, and bilateral facet arthropathy resulting in mild left worse than right neural foraminal stenosis without significant spinal canal stenosis. L5-S1: There is degenerative endplate change and bilateral facet arthropathy superimposed on grade 1 anterolisthesis resulting in severe bilateral neural foraminal stenosis without significant  spinal canal stenosis, unchanged. IMPRESSION: 1. Left subarticular zone disc extrusion at L2-L3 resulting in moderate to severe spinal canal stenosis with impingement of the traversing left cauda equina nerve roots, unchanged since 2020. 2. Decreased size of the superiorly migrated disc extrusion at T12-L1. No significant spinal canal  or neural foraminal stenosis at this level. 3. Severe bilateral neural foraminal stenosis at L5-S1, unchanged. 4. Advanced bilateral facet arthropathy at L5-S1 with prominent effusions, increased since 2020. 5. Diffusely abnormal T1 hypointensity throughout the bone marrow is unchanged going back to 2019. This is nonspecific and can be seen in the setting of chronic hypoxia (including smoking), anemia, or less likely lymphoproliferative disorder given chronicity. 6. No significant interval change in size of the complex right renal mass dating back to 2019. Slow-growing neoplasm is still not excluded. Electronically Signed   By: Valetta Mole M.D.   On: 12/12/2020 15:01   US RENAL  Result Date: 12/29/2020 CLINICAL DATA:  Nephrolithiasis. EXAM: RENAL / URINARY TRACT ULTRASOUND COMPLETE COMPARISON:  Aug 14, 2020 FINDINGS: Right Kidney: Renal measurements: 16.9 cm x 7.2 cm x 6.9 cm = volume: 436.0 mL. Echogenicity within normal limits. An 8.5 cm x 7.2 cm x 7.4 cm heterogeneous, hypoechoic mass is seen within the upper pole of the right kidney. No abnormal flow is noted within this region on color Doppler evaluation. No hydronephrosis is visualized. Left Kidney: Renal measurements: 13.9 cm x 6.8 cm x 5.5 cm = volume: 271.4 mL. Echogenicity within normal limits. 1.4 cm and 2.1 cm shadowing, echogenic foci are seen within the left kidney. These are present on the prior study. No mass or hydronephrosis visualized. Bladder: Urinary bladder is poorly visualized. Other: It should be noted that the study is limited in evaluation secondary to the patient's body habitus. IMPRESSION: 1. Stable right upper pole renal mass. MRI correlation is recommended, as an underlying neoplastic process can not be excluded. 2. Nonobstructing left renal calculi. Electronically Signed   By: Virgina Norfolk M.D.   On: 12/29/2020 19:30   DG Knee 4 Views W/Patella Right  Result Date: 01/04/2021 CLINICAL DATA:  Knee pain EXAM: RIGHT KNEE -  COMPLETE 4+ VIEW COMPARISON:  08/10/2020 FINDINGS: Negative for fracture. Evaluation of joint effusion limited by patient positioning Tricompartmental degenerative change with joint space narrowing and spurring. There is widening of the medial joint space with narrowing of the lateral joint space. Bilateral spurring. Patellofemoral joint space narrowing and spurring. IMPRESSION: Negative for fracture. Advanced tricompartmental degenerative change. Electronically Signed   By: Franchot Gallo M.D.   On: 01/04/2021 14:26    Microbiology: Recent Results (from the past 240 hour(s))  Urine Culture     Status: Abnormal   Collection Time: 12/28/20  8:11 AM   Specimen: Urine, Catheterized  Result Value Ref Range Status   Specimen Description URINE, CATHETERIZED  Final   Special Requests   Final    NONE Performed at [facility], [address], [place], [place] [address]    Culture (A)  Final    30,000 COLONIES/mL ENTEROBACTER CLOACAE 20,000 COLONIES/mL PROTEUS MIRABILIS    Report Status 12/30/2020 FINAL  Final   Organism ID, Bacteria PROTEUS MIRABILIS (A)  Final   Organism ID, Bacteria ENTEROBACTER CLOACAE (A)  Final      Susceptibility   Enterobacter cloacae - MIC*    CEFAZOLIN >=64 RESISTANT Resistant     CEFEPIME 0.5 SENSITIVE Sensitive     CIPROFLOXACIN >=4 RESISTANT Resistant     GENTAMICIN <=1  SENSITIVE Sensitive     IMIPENEM <=0.25 SENSITIVE Sensitive     NITROFURANTOIN <=16 SENSITIVE Sensitive     TRIMETH/SULFA >=320 RESISTANT Resistant     PIP/TAZO 16 SENSITIVE Sensitive     * 30,000 COLONIES/mL ENTEROBACTER CLOACAE   Proteus mirabilis - MIC*    AMPICILLIN <=2 SENSITIVE Sensitive     CEFAZOLIN <=4 SENSITIVE Sensitive     CEFEPIME <=0.12 SENSITIVE Sensitive     CEFTRIAXONE <=0.25 SENSITIVE Sensitive     CIPROFLOXACIN <=0.25 SENSITIVE Sensitive     GENTAMICIN <=1 SENSITIVE Sensitive     IMIPENEM 2 SENSITIVE Sensitive     NITROFURANTOIN RESISTANT Resistant     TRIMETH/SULFA  <=20 SENSITIVE Sensitive     AMPICILLIN/SULBACTAM <=2 SENSITIVE Sensitive     PIP/TAZO <=4 SENSITIVE Sensitive     * 20,000 COLONIES/mL PROTEUS MIRABILIS     Labs: Basic Metabolic Panel: Recent Labs  Lab 12/30/20 0415 01/01/21 1111 01/02/21 0317 01/04/21 0757  NA 139 139  --  137  K 3.6 4.5  --  3.8  CL 107 109  --  104  CO2 26 23  --  25  GLUCOSE 88 106*  --  93  BUN 22* 19  --  24*  CREATININE 1.33* 1.11* 1.24* 1.22*  CALCIUM 8.9 8.9  --  9.4   Liver Function Tests: No results for input(s): AST, ALT, ALKPHOS, BILITOT, PROT, ALBUMIN in the last 168 hours. No results for input(s): LIPASE, AMYLASE in the last 168 hours. No results for input(s): AMMONIA in the last 168 hours. CBC: Recent Labs  Lab 12/30/20 0415  WBC 11.4*  NEUTROABS 7.5  HGB 9.8*  HCT 32.7*  MCV 83.2  PLT 361   Cardiac Enzymes: No results for input(s): CKTOTAL, CKMB, CKMBINDEX, TROPONINI in the last 168 hours. BNP: BNP (last 3 results) No results for input(s): BNP in the last 8760 hours.  ProBNP (last 3 results) No results for input(s): PROBNP in the last 8760 hours.  CBG: No results for input(s): GLUCAP in the last 168 hours.     Signed:  Erin Hearing ANP [facility] 01/05/2021, 12:32 PM

## 2021-01-05 NOTE — Consult Note (Signed)
[facility] New Psychiatric Evaluation   Service Date: January 05, 2021 LOS:  LOS: 27 days    Assessment  Genevra Orne Hurd is a 45 y.o. female admitted medically for 10/30/2020  2:09 PM for chest pain and palpitations. She carries the psychiatric diagnoses of Depression and Anxiety and has a past medical history of ACD, panniculitis, debility,GERD,  OSA not on cpap, chronic back pain. Psychiatry was consulted for Worsening Depression by Samella Parr, NP.    Patient is frustrated but stable.  At this point would not recommend any medication changes.  We do recommend if possible moving her outpatient psych follow up forward as she currently has follow up in 3 weeks.  Diagnoses:  Active Hospital problems: Principal Problem:   Complicated UTI (urinary tract infection) Active Problems:   Asthma   PALPITATIONS, OCCASIONAL   Panniculitis   OSA (obstructive sleep apnea)   Major depressive disorder, recurrent episode, moderate (HCC)   Generalized anxiety disorder   Major depression, chronic   Thrombocytosis   Hypokalemia   CKD (chronic kidney disease), stage III ([place])    Problems edited/added by me: No problems updated.  Dr. Kai Levins functioned as my scribe for this note and placed orders; I was present for and conducted the entirety of the interview and formulated treatment plan.    I personally spent 15  minutes on the unit in direct patient care. The direct patient care time included face-to-face time with the patient, reviewing the patient's chart, communicating with other professionals, and coordinating care. Greater than 50% of this time was spent in counseling or coordinating care with the patient regarding goals of hospitalization, psycho-education, and discharge planning needs.   Plan  ## Safety and Observation Level:  - Based on my clinical evaluation, I estimate the patient to be at low risk of self harm in the current setting - At this time, we  recommend a standard level of observation. This decision is based on my review of the chart including patient's history and current presentation, interview of the patient, mental status examination, and consideration of suicide risk including evaluating suicidal ideation, plan, intent, suicidal or self-harm behaviors, risk factors, and protective factors. This judgment is based on our ability to directly address suicide risk, implement suicide prevention strategies and develop a safety plan while the patient is in the clinical setting. Please contact our team if there is a concern that risk level has changed.   ## Medications:  --Continue Wellbutrin XL 300 mg daily   --Continue Geodon 20 mg daily for mood stabilization   ## Medical Decision Making Capacity:    ## Further Work-up:  --Recommend Primary Team start Metformin once her kidney function has improved enough to tolerate   Vitamin D 33.1 09/24/2018 B12 supratheraputic in 08/10/20.  Last TSH 3.76 on 10/27/2020.   ## Disposition:  --Discharge on 10/1 to hotel with boyfriend  ## Behavioral / Environmental:  -- Would benefit from volunteer visitors if available  ##Legal Status   Thank you for this consult request. Recommendations have been communicated to the primary team.  We will sign off of the patient at this time.   Briant Cedar, MD    Followup history  Relevant Aspects of Hospital Course:  Admitted on 10/30/2020 for chest pain and palpitations.  Patient finished Abx and is being discharged today.    Patient Report:  Her boyfriend was present during interview with pt consent. Some chewing movements noted - reports that it  has been present for years and did not bother her.  She was on the phone with her previous school to see if they would be able to help her with housing.  She reports that since last being seen she reports her mood has been worse.  She reports being more frustrated and been throwing things. By  timeline, this is clearly related to pt's discharge (not s/e of geodon). We did discuss risk of geodon making mouth movements worse, agreeable to stay on at this time (in a challenging situation)     She reports that she is frustrated that everyone seems to be telling her things at the last minute.  She states she feels like she has wasted the last 2 months.  She feels like every time she tries things it does not lead to anything. She reports that the Education officer, museum has said due to her age she does not qualify for long term care and she states this is BS.  She reports she cant go to a homeless shelter and she cant go to the [facility] and get a tent.  She reports that her degenerative disks and knee make it so she cannot get up from the ground.  She reports she plans to continue to go to [facility] for followup therapy and med mgmt.  Discussed trying to get her appointment moved up from Oct 24 to see if she can be seen sooner given the medication changes.  Encouraged her that she is still trying her best.  She reports no SI and no AVH. When asked if she had HI she said best if she not answer that but states no plans because she is just angry (not homicidal). She reports not sleeping well last night. She has not eaten her lunch.     Collateral information:    Psychiatric History:  See initial consult; updated where appropriate.   Social History:    Tobacco use: None Alcohol use: None Drug use: None  Family History:   Medical History: Past Medical History:  Diagnosis Date   Allergy    Amenorrhea    Anemia    post partum    Anxiety    Anxiety    Arthritis    Asthma    Back pain    Constipation    COPD (chronic obstructive pulmonary disease) ([place])    Delivery with history of C-section    Depression    Depression    Diabetes mellitus without complication ([place])    patient denies but states she has hyperglycemia-diet controlled   Dysmenorrhea    Dysrhythmia    DR Johnsie Cancel     Ectopic  pregnancy 2013   Edema, lower extremity    Eosinophilic esophagitis    Diagnosed at [facility] 06/16/2013, untreated   Gallbladder problem    GERD (gastroesophageal reflux disease)    HEARTBURN   TUMS   Hard to intubate 11/07/2015   High cholesterol    IBS (irritable bowel syndrome)    Leukocytosis 07/28/2008   Qualifier: Diagnosis of  By: Jonna Munro MD, Cornelius     Morbid obesity [facility])    Neuromuscular disorder ([place])    RESTLESS LEG    Obesity    Schizoaffective disorder, bipolar type ([place])    Sepsis ([place]) 11/11/2014   Shortness of breath    WITH EXERTION    Sleep apnea    CPAP- in process of restarting     Surgical History: Past Surgical History:  Procedure Laterality Date   BIOPSY  08/13/2018   Procedure: BIOPSY;  Surgeon: Yetta Flock, MD;  Location: [facility];  Service: Gastroenterology;;   CESAREAN SECTION MULTI-GESTATIONAL N/A 02/03/2017   Procedure: CESAREAN SECTION MULTI-GESTATIONAL;  Surgeon: Jonnie Kind, MD;  Location: [place] [facility];  Service: Obstetrics;  Laterality: N/A;   CHOLECYSTECTOMY     COLONOSCOPY WITH PROPOFOL N/A 08/13/2018   Procedure: COLONOSCOPY WITH PROPOFOL;  Surgeon: Yetta Flock, MD;  Location: [facility];  Service: Gastroenterology;  Laterality: N/A;   DENTAL SURGERY     ESOPHAGOGASTRODUODENOSCOPY  May 2007   Dr. Gala Romney: Normal esophagus, stomach, D1, D2   ESOPHAGOGASTRODUODENOSCOPY  06/16/2013   Dr. Carlton Adam, eosinophilic esophagitis, reactive gastropathy, no esophageal dilation   ESOPHAGOGASTRODUODENOSCOPY (EGD) WITH PROPOFOL N/A 08/13/2018   Procedure: ESOPHAGOGASTRODUODENOSCOPY (EGD) WITH PROPOFOL;  Surgeon: Yetta Flock, MD;  Location: [facility];  Service: Gastroenterology;  Laterality: N/A;   POLYPECTOMY  08/13/2018   Procedure: POLYPECTOMY;  Surgeon: Yetta Flock, MD;  Location: [facility];  Service: Gastroenterology;;   TONSILLECTOMY     TOOTH EXTRACTION  10/28/2011   Procedure: DENTAL  RESTORATION/EXTRACTIONS;  Surgeon: Gae Bon, DDS;  Location: [facility];  Service: Oral Surgery;;   UPPER GASTROINTESTINAL ENDOSCOPY      Medications:   Current Facility-Administered Medications:    acetaminophen (TYLENOL) tablet 650 mg, 650 mg, Oral, Q6H PRN, 650 mg at 01/02/21 0635 **OR** [DISCONTINUED] acetaminophen (TYLENOL) suppository 650 mg, 650 mg, Rectal, Q6H PRN, Orma Flaming, MD   albuterol (PROVENTIL) (2.5 MG/3ML) 0.083% nebulizer solution 2.5 mg, 2.5 mg, Nebulization, Q4H PRN, Hammons, Kimberly B, RPH   ascorbic acid (VITAMIN C) tablet 500 mg, 500 mg, Oral, Daily, Orma Flaming, MD, 500 mg at 01/04/21 2706   buPROPion (WELLBUTRIN XL) 24 hr tablet 300 mg, 300 mg, Oral, Daily, Pashayan, Redgie Grayer, MD, 300 mg at 01/04/21 0919   ceFEPIme (MAXIPIME) 2 g in sodium chloride 0.9 % 100 mL IVPB, 2 g, Intravenous, Q8H, Wendee Beavers, RPH, Last Rate: 200 mL/hr at 01/05/21 0240, 2 g at 01/05/21 0240   clonazePAM (KLONOPIN) disintegrating tablet 0.25 mg, 0.25 mg, Oral, Daily PRN, Briant Cedar, MD, 0.25 mg at 01/03/21 2121   clotrimazole (LOTRIMIN) 1 % cream, , Topical, BID, Orma Flaming, MD, Given at 01/01/21 1013   diclofenac Sodium (VOLTAREN) 1 % topical gel 4 g, 4 g, Topical, QID, Erin Hearing L, NP, 4 g at 01/04/21 1659   enoxaparin (LOVENOX) injection 100 mg, 100 mg, Subcutaneous, Q24H, Orma Flaming, MD, 100 mg at 01/04/21 0923   fluticasone (FLONASE) 50 MCG/ACT nasal spray 2 spray, 2 spray, Each Nare, BID, Orma Flaming, MD, 2 spray at 01/04/21 2376   furosemide (LASIX) tablet 40 mg, 40 mg, Oral, Daily, Erin Hearing L, NP, 40 mg at 01/04/21 0924   lidocaine (LIDODERM) 5 % 1 patch, 1 patch, Transdermal, Q24H, Samella Parr, NP, 1 patch at 01/04/21 1257   methocarbamol (ROBAXIN) tablet 500 mg, 500 mg, Oral, QID, Samella Parr, NP, 500 mg at 01/04/21 2133   mometasone-formoterol (DULERA) 100-5 MCG/ACT inhaler 2 puff, 2 puff, Inhalation, BID, Samella Parr, NP, 2  puff at 01/05/21 0808   montelukast (SINGULAIR) tablet 10 mg, 10 mg, Oral, QHS, Orma Flaming, MD, 10 mg at 01/04/21 2133   nitroGLYCERIN (NITROSTAT) SL tablet 0.4 mg, 0.4 mg, Sublingual, Q5 min PRN, Tawanna Solo, Amrit, MD, 0.4 mg at 11/11/20 1854   nystatin (MYCOSTATIN/NYSTOP) topical powder, , Topical, TID, Samella Parr, NP, Given at 01/03/21 0901   ondansetron (  ZOFRAN) tablet 4 mg, 4 mg, Oral, Q6H PRN, 4 mg at 12/26/20 0840 **OR** ondansetron (ZOFRAN) injection 4 mg, 4 mg, Intravenous, Q6H PRN, Orma Flaming, MD, 4 mg at 11/03/20 5643   oxyCODONE (Oxy IR/ROXICODONE) immediate release tablet 5 mg, 5 mg, Oral, Q4H PRN, Samella Parr, NP, 5 mg at 01/04/21 2133   pantoprazole (PROTONIX) EC tablet 40 mg, 40 mg, Oral, Daily, Adhikari, Amrit, MD, 40 mg at 01/04/21 3295   potassium chloride SA (KLOR-CON) CR tablet 20 mEq, 20 mEq, Oral, Daily, Samella Parr, NP, 20 mEq at 01/04/21 1884   pregabalin (LYRICA) capsule 100 mg, 100 mg, Oral, TID, Samella Parr, NP, 100 mg at 01/04/21 2133   rOPINIRole (REQUIP) tablet 4 mg, 4 mg, Oral, QHS, Orma Flaming, MD, 4 mg at 01/04/21 2133   senna-docusate (Senokot-S) tablet 1 tablet, 1 tablet, Oral, QHS PRN, Orma Flaming, MD   senna-docusate (Senokot-S) tablet 2 tablet, 2 tablet, Oral, Daily, Orma Flaming, MD, 2 tablet at 01/04/21 1660   silver sulfADIAZINE (SILVADENE) 1 % cream, , Topical, Daily, Kc, Maren Beach, MD, Given at 01/04/21 1301   topiramate (TOPAMAX) tablet 25 mg, 25 mg, Oral, BID, Samella Parr, NP, 25 mg at 01/04/21 2133   traZODone (DESYREL) tablet 50 mg, 50 mg, Oral, QHS, McQuilla, Jai B, MD, 50 mg at 01/04/21 2133   umeclidinium bromide (INCRUSE ELLIPTA) 62.5 MCG/INH 1 puff, 1 puff, Inhalation, Daily, Samella Parr, NP, 1 puff at 01/05/21 0808   witch hazel-glycerin (TUCKS) pad, , Topical, PRN, Shelly Coss, MD   ziprasidone (GEODON) capsule 20 mg, 20 mg, Oral, Daily, Briant Cedar, MD, 20 mg at 01/04/21  6301  Allergies: Allergies  Allergen Reactions   Amoxicillin Anaphylaxis   Bee Venom Shortness Of Breath   Penicillins Anaphylaxis    Tolerates cephalexin and ceftriaxone Has patient had a PCN reaction causing immediate rash, facial/tongue/throat swelling, SOB or lightheadedness with hypotension: No Has patient had a PCN reaction causing severe rash involving mucus membranes or skin necrosis: No Has patient had a PCN reaction that required hospitalization No Has patient had a PCN reaction occurring within the last 10 years: No If all of the above answers are "NO", then may proceed with Cephalosporin use.'  REACTION: Angioedema   Penicillin G    Adhesive [Tape] Rash   Latex Rash   Vancomycin Other (See Comments)    Pt can tolerate Vancomycin but did cause Vancomycin Infusion Reaction.  Recommend to pre-medicate with Benadryl before doses administered.      The patient's family history includes Allergic rhinitis in her sister; Anxiety disorder in her maternal grandmother and mother; Bipolar disorder in her mother; COPD in her maternal grandmother; Cancer in her maternal grandfather; Colon polyps in her maternal grandmother; Crohn's disease in her maternal aunt; Depression in her mother; Diabetes in her maternal grandmother; Eating disorder in her father and mother; HIV/AIDS in her father; High blood pressure in her mother; Hypertension in her sister; Obesity in her father and mother.    Objective  Vital signs:  Temp:  [98.5 F (36.9 C)-98.7 F (37.1 C)] 98.5 F (36.9 C) (09/29 2109) Pulse Rate:  [90-93] 93 (09/29 2109) Resp:  [18] 18 (09/29 2109) BP: (109-123)/(60-77) 109/60 (09/29 2109) SpO2:  [95 %-97 %] 95 % (09/29 2109)  Physical Exam: Physical Exam Vitals and nursing note reviewed.  Constitutional:      Appearance: She is obese.  HENT:     Head: Normocephalic and atraumatic.  Pulmonary:  Effort: Pulmonary effort is normal.  Neurological:     General: No focal  deficit present.     Mental Status: She is alert.  Review of Systems  Respiratory:  Negative for shortness of breath.   Cardiovascular:  Negative for chest pain.  Gastrointestinal:  Negative for abdominal pain, constipation, diarrhea, nausea and vomiting.  Neurological:  Negative for headaches.  Psychiatric/Behavioral:  Negative for depression, hallucinations and suicidal ideas.      Mental Status Exam:     01/05/21 1456  Presentation  General Appearance Appropriate for Environment;Casual  Eye Contact Fair  Speech Clear and Coherent;Normal Rate  Speech Volume Normal  Mood and Affect  Mood Angry  Affect Congruent  Thought Processes  Thought Process Coherent  Descriptions of Associations Intact  Orientation Full (Time, Place and Person)  Thought Content Logical  Hallucinations None  Ideas of Reference None  Suicidal Thoughts No  Homicidal Thoughts No  Sensorium  Memory Immediate Fair;Recent Fair  Judgment Fair  Insight Fair  Executive Functions  Concentration Good  Attention Span Good  Recall Good  Fund of Knowledge Fair  Language Good  Psychomotor Activity  Psychomotor Activity Some chewing movements, not bothersome to pt  Assets  Assets Resilience;Communication Skills  Sleep  Sleep Fair     Data Reviewed:  Additional Psychometric Testing:

## 2021-01-05 NOTE — Progress Notes (Signed)
CSW spoke with Anner Crete who states the [facility] is open until 2pm on Saturday 01/06/21.  [name], MSW, LCSW Transitions of Care  Clinical Social Worker II [phone]

## 2021-01-05 NOTE — Progress Notes (Signed)
Patient will be discharged to the [facility] after her completion of IV antibiotics this evening.   CSW will provide RN with cab voucher to assist with transportation to [facility].  [name], MSW, LCSW Transitions of Care  Clinical Social Worker II [phone]

## 2021-01-05 NOTE — Progress Notes (Signed)
Called [facility] to confirmed they are open for the pt. They are open til 2pm 10/1

## 2021-01-06 ENCOUNTER — Emergency Department (HOSPITAL_COMMUNITY)
Admission: EM | Admit: 2021-01-06 | Discharge: 2021-01-09 | Disposition: A | Discharge status: Home / Self Care | Payer: Medicaid Other | Attending: Emergency Medicine | Admitting: Emergency Medicine

## 2021-01-06 ENCOUNTER — Encounter (HOSPITAL_COMMUNITY): Payer: Self-pay

## 2021-01-06 ENCOUNTER — Other Ambulatory Visit: Payer: Self-pay

## 2021-01-06 ENCOUNTER — Emergency Department (HOSPITAL_COMMUNITY): Payer: Medicaid Other

## 2021-01-06 DIAGNOSIS — E1122 Type 2 diabetes mellitus with diabetic chronic kidney disease: Secondary | ICD-10-CM | POA: Diagnosis not present

## 2021-01-06 DIAGNOSIS — Z20822 Contact with and (suspected) exposure to covid-19: Secondary | ICD-10-CM | POA: Diagnosis not present

## 2021-01-06 DIAGNOSIS — Z7951 Long term (current) use of inhaled steroids: Secondary | ICD-10-CM | POA: Diagnosis not present

## 2021-01-06 DIAGNOSIS — R109 Unspecified abdominal pain: Secondary | ICD-10-CM | POA: Diagnosis present

## 2021-01-06 DIAGNOSIS — J449 Chronic obstructive pulmonary disease, unspecified: Secondary | ICD-10-CM | POA: Diagnosis not present

## 2021-01-06 DIAGNOSIS — M545 Low back pain, unspecified: Secondary | ICD-10-CM | POA: Diagnosis not present

## 2021-01-06 DIAGNOSIS — Z87891 Personal history of nicotine dependence: Secondary | ICD-10-CM | POA: Insufficient documentation

## 2021-01-06 DIAGNOSIS — I129 Hypertensive chronic kidney disease with stage 1 through stage 4 chronic kidney disease, or unspecified chronic kidney disease: Secondary | ICD-10-CM | POA: Insufficient documentation

## 2021-01-06 DIAGNOSIS — Z91040 Latex allergy status: Secondary | ICD-10-CM | POA: Diagnosis not present

## 2021-01-06 DIAGNOSIS — Z85038 Personal history of other malignant neoplasm of large intestine: Secondary | ICD-10-CM | POA: Diagnosis not present

## 2021-01-06 DIAGNOSIS — F4320 Adjustment disorder, unspecified: Secondary | ICD-10-CM

## 2021-01-06 DIAGNOSIS — R1084 Generalized abdominal pain: Secondary | ICD-10-CM

## 2021-01-06 DIAGNOSIS — J4530 Mild persistent asthma, uncomplicated: Secondary | ICD-10-CM | POA: Insufficient documentation

## 2021-01-06 DIAGNOSIS — F332 Major depressive disorder, recurrent severe without psychotic features: Secondary | ICD-10-CM | POA: Diagnosis not present

## 2021-01-06 DIAGNOSIS — Z79899 Other long term (current) drug therapy: Secondary | ICD-10-CM | POA: Insufficient documentation

## 2021-01-06 DIAGNOSIS — F339 Major depressive disorder, recurrent, unspecified: Secondary | ICD-10-CM

## 2021-01-06 DIAGNOSIS — N183 Chronic kidney disease, stage 3 unspecified: Secondary | ICD-10-CM | POA: Insufficient documentation

## 2021-01-06 DIAGNOSIS — R45851 Suicidal ideations: Secondary | ICD-10-CM

## 2021-01-06 LAB — COMPREHENSIVE METABOLIC PANEL
ALT: 19 U/L (ref 0–44)
AST: 17 U/L (ref 15–41)
Albumin: 3.9 g/dL (ref 3.5–5.0)
Alkaline Phosphatase: 66 U/L (ref 38–126)
Anion gap: 11 (ref 5–15)
BUN: 44 mg/dL — ABNORMAL HIGH (ref 6–20)
CO2: 24 mmol/L (ref 22–32)
Calcium: 10.1 mg/dL (ref 8.9–10.3)
Chloride: 104 mmol/L (ref 98–111)
Creatinine, Ser: 1.62 mg/dL — ABNORMAL HIGH (ref 0.44–1.00)
GFR, Estimated: 40 mL/min — ABNORMAL LOW (ref >60)
Glucose, Bld: 97 mg/dL (ref 70–99)
Potassium: 4.2 mmol/L (ref 3.5–5.1)
Sodium: 139 mmol/L (ref 135–145)
Total Bilirubin: 0.4 mg/dL (ref 0.3–1.2)
Total Protein: 9.1 g/dL — ABNORMAL HIGH (ref 6.5–8.1)

## 2021-01-06 LAB — URINALYSIS, ROUTINE W REFLEX MICROSCOPIC
Bilirubin Urine: NEGATIVE
Glucose, UA: NEGATIVE mg/dL
Ketones, ur: 5 mg/dL — AB
Nitrite: NEGATIVE
Protein, ur: 100 mg/dL — AB
RBC / HPF: >50 RBC/hpf — ABNORMAL HIGH (ref 0–5)
Specific Gravity, Urine: 1.018 (ref 1.005–1.030)
WBC, UA: >50 WBC/hpf — ABNORMAL HIGH (ref 0–5)
pH: 5 (ref 5.0–8.0)

## 2021-01-06 LAB — RAPID URINE DRUG SCREEN, HOSP PERFORMED
Amphetamines: NOT DETECTED
Barbiturates: NOT DETECTED
Benzodiazepines: NOT DETECTED
Cocaine: NOT DETECTED
Opiates: NOT DETECTED
Tetrahydrocannabinol: NOT DETECTED

## 2021-01-06 LAB — CBC WITH DIFFERENTIAL/PLATELET
Abs Immature Granulocytes: 0.2 10*3/uL — ABNORMAL HIGH (ref 0.00–0.07)
Basophils Absolute: 0.1 10*3/uL (ref 0.0–0.1)
Basophils Relative: 0 %
Eosinophils Absolute: 0.2 10*3/uL (ref 0.0–0.5)
Eosinophils Relative: 1 %
HCT: 39 % (ref 36.0–46.0)
Hemoglobin: 11.5 g/dL — ABNORMAL LOW (ref 12.0–15.0)
Immature Granulocytes: 1 %
Lymphocytes Relative: 15 %
Lymphs Abs: 2.7 10*3/uL (ref 0.7–4.0)
MCH: 24.9 pg — ABNORMAL LOW (ref 26.0–34.0)
MCHC: 29.5 g/dL — ABNORMAL LOW (ref 30.0–36.0)
MCV: 84.4 fL (ref 80.0–100.0)
Monocytes Absolute: 0.9 10*3/uL (ref 0.1–1.0)
Monocytes Relative: 5 %
Neutro Abs: 14.3 10*3/uL — ABNORMAL HIGH (ref 1.7–7.7)
Neutrophils Relative %: 78 %
Platelets: 453 10*3/uL — ABNORMAL HIGH (ref 150–400)
RBC: 4.62 MIL/uL (ref 3.87–5.11)
RDW: 18.1 % — ABNORMAL HIGH (ref 11.5–15.5)
WBC: 18.3 10*3/uL — ABNORMAL HIGH (ref 4.0–10.5)
nRBC: 0 % (ref 0.0–0.2)

## 2021-01-06 LAB — ETHANOL: Alcohol, Ethyl (B): <10 mg/dL (ref <10)

## 2021-01-06 LAB — I-STAT BETA HCG BLOOD, ED (MC, WL, AP ONLY): I-stat hCG, quantitative: <5 m[IU]/mL (ref <5)

## 2021-01-06 LAB — RESP PANEL BY RT-PCR (FLU A&B, COVID) ARPGX2
Influenza A by PCR: NEGATIVE
Influenza B by PCR: NEGATIVE
SARS Coronavirus 2 by RT PCR: NEGATIVE

## 2021-01-06 LAB — SALICYLATE LEVEL: Salicylate Lvl: <7 mg/dL — ABNORMAL LOW (ref 7.0–30.0)

## 2021-01-06 LAB — ACETAMINOPHEN LEVEL: Acetaminophen (Tylenol), Serum: <10 ug/mL — ABNORMAL LOW (ref 10–30)

## 2021-01-06 MED ORDER — FUROSEMIDE 40 MG PO TABS
40.0000 mg | ORAL_TABLET | Freq: Every day | ORAL | Status: DC
  Administered 2021-01-06 – 2021-01-09 (×4): 40 mg via ORAL
  Filled 2021-01-06 (×4): qty 1

## 2021-01-06 MED ORDER — MONTELUKAST SODIUM 10 MG PO TABS
10.0000 mg | ORAL_TABLET | Freq: Every day | ORAL | Status: DC
  Administered 2021-01-06 – 2021-01-08 (×3): 10 mg via ORAL
  Filled 2021-01-06 (×3): qty 1

## 2021-01-06 MED ORDER — CEPHALEXIN 500 MG PO CAPS: 500.0000 mg | ORAL_CAPSULE | Freq: Four times a day (QID) | ORAL | 0 refills | Status: AC

## 2021-01-06 MED ORDER — ACETAMINOPHEN 325 MG PO TABS
650.0000 mg | ORAL_TABLET | Freq: Once | ORAL | Status: AC
  Administered 2021-01-06: 650 mg via ORAL
  Filled 2021-01-06: qty 2

## 2021-01-06 MED ORDER — MOMETASONE FURO-FORMOTEROL FUM 100-5 MCG/ACT IN AERO
2.0000 | INHALATION_SPRAY | Freq: Two times a day (BID) | RESPIRATORY_TRACT | Status: DC
  Administered 2021-01-06 – 2021-01-09 (×6): 2 via RESPIRATORY_TRACT
  Filled 2021-01-06: qty 8.8

## 2021-01-06 MED ORDER — ZIPRASIDONE HCL 20 MG PO CAPS
20.0000 mg | ORAL_CAPSULE | Freq: Every day | ORAL | Status: DC
  Administered 2021-01-06 – 2021-01-09 (×4): 20 mg via ORAL
  Filled 2021-01-06 (×4): qty 1

## 2021-01-06 MED ORDER — FLUTICASONE PROPIONATE 50 MCG/ACT NA SUSP
2.0000 | Freq: Two times a day (BID) | NASAL | Status: DC
  Administered 2021-01-06 – 2021-01-09 (×6): 2 via NASAL
  Filled 2021-01-06: qty 16

## 2021-01-06 MED ORDER — ROPINIROLE HCL 1 MG PO TABS
4.0000 mg | ORAL_TABLET | Freq: Every day | ORAL | Status: DC
  Administered 2021-01-06 – 2021-01-08 (×3): 4 mg via ORAL
  Filled 2021-01-06 (×3): qty 4

## 2021-01-06 MED ORDER — SODIUM CHLORIDE 0.9 % IV BOLUS
500.0000 mL | Freq: Once | INTRAVENOUS | Status: AC
Start: 2021-01-06 — End: 2021-01-06
  Administered 2021-01-06: 500 mL via INTRAVENOUS

## 2021-01-06 MED ORDER — MOMETASONE FURO-FORMOTEROL FUM 100-5 MCG/ACT IN AERO: 2.0000 | INHALATION_SPRAY | Freq: Two times a day (BID) | RESPIRATORY_TRACT | Status: DC

## 2021-01-06 MED ORDER — BUPROPION HCL ER (XL) 150 MG PO TB24
300.0000 mg | ORAL_TABLET | Freq: Every day | ORAL | Status: DC
  Administered 2021-01-06 – 2021-01-09 (×4): 300 mg via ORAL
  Filled 2021-01-06 (×4): qty 2

## 2021-01-06 MED ORDER — PREGABALIN 50 MG PO CAPS
100.0000 mg | ORAL_CAPSULE | Freq: Three times a day (TID) | ORAL | Status: DC
  Administered 2021-01-06 – 2021-01-09 (×9): 100 mg via ORAL
  Filled 2021-01-06 (×9): qty 2

## 2021-01-06 MED ORDER — TOPIRAMATE 25 MG PO TABS
25.0000 mg | ORAL_TABLET | Freq: Two times a day (BID) | ORAL | Status: DC
  Administered 2021-01-06 – 2021-01-09 (×7): 25 mg via ORAL
  Filled 2021-01-06 (×7): qty 1

## 2021-01-06 MED ORDER — ASCORBIC ACID 500 MG PO TABS
500.0000 mg | ORAL_TABLET | Freq: Every day | ORAL | Status: DC
  Administered 2021-01-06 – 2021-01-09 (×4): 500 mg via ORAL
  Filled 2021-01-06 (×4): qty 1

## 2021-01-06 MED ORDER — SENNOSIDES-DOCUSATE SODIUM 8.6-50 MG PO TABS
2.0000 | ORAL_TABLET | Freq: Every day | ORAL | Status: DC
  Administered 2021-01-06 – 2021-01-09 (×3): 2 via ORAL
  Filled 2021-01-06 (×4): qty 2

## 2021-01-06 MED ORDER — VITAMIN B-12 1000 MCG PO TABS
1000.0000 ug | ORAL_TABLET | Freq: Every day | ORAL | Status: DC
  Administered 2021-01-06 – 2021-01-09 (×4): 1000 ug via ORAL
  Filled 2021-01-06 (×4): qty 1

## 2021-01-06 MED ORDER — POTASSIUM CHLORIDE CRYS ER 20 MEQ PO TBCR
20.0000 meq | EXTENDED_RELEASE_TABLET | Freq: Every day | ORAL | Status: DC
  Administered 2021-01-06 – 2021-01-09 (×4): 20 meq via ORAL
  Filled 2021-01-06 (×4): qty 1

## 2021-01-06 MED ORDER — IOHEXOL 350 MG/ML SOLN
80.0000 mL | Freq: Once | INTRAVENOUS | Status: AC | PRN
  Administered 2021-01-06: 80 mL via INTRAVENOUS

## 2021-01-06 MED ORDER — METHOCARBAMOL 500 MG PO TABS
500.0000 mg | ORAL_TABLET | Freq: Four times a day (QID) | ORAL | Status: DC
  Administered 2021-01-06 – 2021-01-09 (×8): 500 mg via ORAL
  Filled 2021-01-06 (×8): qty 1

## 2021-01-06 MED ORDER — CEPHALEXIN 500 MG PO CAPS
500.0000 mg | ORAL_CAPSULE | Freq: Once | ORAL | Status: AC
  Administered 2021-01-06: 500 mg via ORAL
  Filled 2021-01-06: qty 1

## 2021-01-06 NOTE — Discharge Instructions (Addendum)
For your behavioral health needs you are advised to follow up with [facility].  You have an appointment scheduled for Monday, January 29, 2021 at 2:30 pm:       [facility]      [place], [place] [address]      [phone]

## 2021-01-06 NOTE — ED Notes (Signed)
Patient ambulated with walker.

## 2021-01-06 NOTE — ED Notes (Signed)
Patient transported to CT 

## 2021-01-06 NOTE — ED Provider Notes (Signed)
[facility] DEPT Provider Note   CSN: [id number] Arrival date & time: 01/06/21  0907     History Chief Complaint  Patient presents with   Leg Pain    R Leg pain, burning   Abdominal Pain    L side   Suicidal    Heather [name] is a 45 y.o. female.   Leg Pain Abdominal Pain     Past Medical History:  Diagnosis Date   Allergy    Amenorrhea    Anemia    post partum    Anxiety    Anxiety    Arthritis    Asthma    Back pain    Constipation    COPD (chronic obstructive pulmonary disease) ([place])    Delivery with history of C-section    Depression    Depression    Diabetes mellitus without complication ([place])    patient denies but states she has hyperglycemia-diet controlled   Dysmenorrhea    Dysrhythmia    DR Johnsie Cancel     Ectopic pregnancy 2013   Edema, lower extremity    Eosinophilic esophagitis    Diagnosed at [facility] 06/16/[facility], untreated   Gallbladder problem    GERD (gastroesophageal reflux disease)    HEARTBURN   TUMS   Hard to intubate 11/07/2015   High cholesterol    IBS (irritable bowel syndrome)    Leukocytosis 07/28/2008   Qualifier: Diagnosis of  By: Jonna Munro MD, Cornelius     Morbid obesity [facility])    Neuromuscular disorder ([place])    RESTLESS LEG    Obesity    Schizoaffective disorder, bipolar type ([place])    Sepsis ([place]) 11/11/2014   Shortness of breath    WITH EXERTION    Sleep apnea    CPAP- in process of restarting     Patient Active Problem List   Diagnosis Date Noted   Complicated UTI (urinary tract infection) 10/31/2020   Thrombocytosis 10/31/2020   Hypokalemia 10/31/2020   CKD (chronic kidney disease), stage III ([place]) 10/31/2020   Adjustment disorder with mixed anxiety and depressed mood 10/27/2020   Major depression, chronic 09/13/2020   Insomnia    Anemia of chronic disease    Hyponatremia    AKI (acute kidney injury) ([place])    Chronic bilateral low back pain without sciatica    Debility 08/24/2020    SIRS (systemic inflammatory response syndrome) ([place]) 08/18/2020   Pressure injury of skin 08/11/2020   Rhabdomyolysis 08/10/2020   Acute renal failure ([place]) 08/10/2020   PTSD (post-traumatic stress disorder) 05/15/2020   Major depressive disorder, recurrent episode, moderate ([place]) 05/05/2020   Generalized anxiety disorder 05/05/2020   Blood in stool    Gastritis and gastroduodenitis    Benign neoplasm of sigmoid colon    Difficult intubation 08/10/2018   Class 3 severe obesity with serious comorbidity and body mass index (BMI) greater than or equal to 70 in adult ([place]) 06/16/2018   Nexplanon insertion 03/27/2017   Postpartum hypertension 03/05/2017   Status post primary low transverse cesarean section 02/03/2017   H/O pre-eclampsia 01/27/2017   Gestational htn w/o significant proteinuria, third trimester 01/20/2017   Mild persistent asthma without complication 04/10/7251   Perennial allergic rhinitis 11/05/2016   Mild persistent asthma with acute exacerbation 11/05/2016   Excess weight gain in pregnancy, second trimester 10/30/2016   Hard to intubate 11/07/2015   Hypoglycemia 11/07/2015   OSA (obstructive sleep apnea) 11/07/2015   DM type 2 (diabetes mellitus, type 2) ([place]) 12/07/2014  Panniculitis 12/06/2014   Cellulitis, abdominal wall 11/11/2014   Abdominal pain 07/14/2014   Loose stools 07/14/2014   Melena 63/04/6008   Eosinophilic esophagitis 93/23/5573   Change in bowel habits 04/28/2013   Esophageal dysphagia 04/28/2013   Insomnia due to mental disorder(327.02) 08/08/2011   RLS (restless legs syndrome) 08/08/2011   PALPITATIONS, OCCASIONAL 11/01/2009   Leucocytosis 07/28/2008   ALLERGIC RHINITIS, SEASONAL 22/05/5425   DYSMETABOLIC SYNDROME 09/29/7626   Morbid obesity ([place]) 05/13/2006   EXTERNAL HEMORRHOIDS 05/13/2006   HYPERLIPIDEMIA 05/12/2006   Essential hypertension 05/12/2006   Asthma 05/12/2006   OSTEOARTHRITIS 05/12/2006    Past Surgical History:   Procedure Laterality Date   BIOPSY  08/13/2018   Procedure: BIOPSY;  Surgeon: Yetta Flock, MD;  Location: [facility];  Service: Gastroenterology;;   CESAREAN SECTION MULTI-GESTATIONAL N/A 02/03/2017   Procedure: CESAREAN SECTION MULTI-GESTATIONAL;  Surgeon: Jonnie Kind, MD;  Location: [facility];  Service: Obstetrics;  Laterality: N/A;   CHOLECYSTECTOMY     COLONOSCOPY WITH PROPOFOL N/A 08/13/2018   Procedure: COLONOSCOPY WITH PROPOFOL;  Surgeon: Yetta Flock, MD;  Location: [facility];  Service: Gastroenterology;  Laterality: N/A;   DENTAL SURGERY     ESOPHAGOGASTRODUODENOSCOPY  May 2007   Dr. Gala Romney: Normal esophagus, stomach, D1, D2   ESOPHAGOGASTRODUODENOSCOPY  06/16/2013   Dr. Carlton Adam, eosinophilic esophagitis, reactive gastropathy, no esophageal dilation   ESOPHAGOGASTRODUODENOSCOPY (EGD) WITH PROPOFOL N/A 08/13/2018   Procedure: ESOPHAGOGASTRODUODENOSCOPY (EGD) WITH PROPOFOL;  Surgeon: Yetta Flock, MD;  Location: [facility];  Service: Gastroenterology;  Laterality: N/A;   POLYPECTOMY  08/13/2018   Procedure: POLYPECTOMY;  Surgeon: Yetta Flock, MD;  Location: [facility];  Service: Gastroenterology;;   TONSILLECTOMY     TOOTH EXTRACTION  10/28/2011   Procedure: DENTAL RESTORATION/EXTRACTIONS;  Surgeon: Gae Bon, DDS;  Location: [facility] OR;  Service: Oral Surgery;;   UPPER GASTROINTESTINAL ENDOSCOPY       OB History     Gravida  2   Para  1   Term  1   Preterm      AB  1   Living  2      SAB  0   IAB      Ectopic  1   Multiple  1   Live Births  2           Family History  Problem Relation Age of Onset   Depression Mother    Anxiety disorder Mother    High blood pressure Mother    Bipolar disorder Mother    Eating disorder Mother    Obesity Mother    Hypertension Sister    Allergic rhinitis Sister    Colon polyps Maternal Grandmother        12s   Diabetes Maternal Grandmother    Anxiety disorder  Maternal Grandmother    COPD Maternal Grandmother    Crohn's disease Maternal Aunt    Cancer Maternal Grandfather        prostate   HIV/AIDS Father    Eating disorder Father    Obesity Father    Liver disease Neg Hx    Angioedema Neg Hx    Eczema Neg Hx    Immunodeficiency Neg Hx    Asthma Neg Hx    Urticaria Neg Hx    Colon cancer Neg Hx    Esophageal cancer Neg Hx    Rectal cancer Neg Hx    Stomach cancer Neg Hx     Social History  Tobacco Use   Smoking status: Former    Packs/day: 0.50    Years: 8.00    Pack years: 4.00    Types: Cigarettes    Quit date: 04/25/2011    Years since quitting: 9.7   Smokeless tobacco: Never  Vaping Use   Vaping Use: Never used  Substance Use Topics   Alcohol use: No   Drug use: No    Home Medications Prior to Admission medications   Medication Sig Start Date End Date Taking? Authorizing Provider  methocarbamol (ROBAXIN) 500 MG tablet Take 1 tablet (500 mg total) by mouth 4 (four) times daily. 12/27/20  Yes Samella Parr, NP  oxyCODONE (OXY IR/ROXICODONE) 5 MG immediate release tablet Take 1 tablet (5 mg total) by mouth every 4 (four) hours as needed for moderate pain. 12/27/20  Yes Samella Parr, NP  rOPINIRole (REQUIP) 4 MG tablet Take 1 tablet (4 mg total) by mouth at bedtime. 12/27/20  Yes Samella Parr, NP  traZODone (DESYREL) 50 MG tablet Take 1 tablet (50 mg total) by mouth at bedtime. 12/27/20  Yes Samella Parr, NP  acetaminophen (TYLENOL) 325 MG tablet Take 650 mg by mouth every 6 (six) hours as needed for mild pain, fever or headache.    [provider]  albuterol (VENTOLIN HFA) 108 (90 Base) MCG/ACT inhaler Inhale 2 puffs into the lungs every 4 (four) hours as needed for wheezing or shortness of breath. 12/27/20   Samella Parr, NP  ascorbic acid (VITAMIN C) 500 MG tablet Take 1 tablet (500 mg total) by mouth daily. 12/27/20   Samella Parr, NP  buPROPion (WELLBUTRIN XL) 300 MG 24 hr tablet Take 1 tablet  (300 mg total) by mouth daily. 01/04/21   Samella Parr, NP  clonazePAM (KLONOPIN) 0.5 MG tablet Take 1/2 tablet (0.25 mg total) by mouth daily as needed (panic attacks). 01/04/21   Samella Parr, NP  clotrimazole (LOTRIMIN) 1 % cream Apply topically 2 (two) times daily. 11/02/20   Shelly Coss, MD  vitamin B-12 (CYANOCOBALAMIN) 1000 MCG tablet Take 1 tablet (1,000 mcg total) by mouth daily. 12/27/20   Samella Parr, NP  diclofenac Sodium (VOLTAREN) 1 % GEL Apply 4 g topically 4 (four) times daily. 12/27/20   Samella Parr, NP  fluticasone (FLONASE) 50 MCG/ACT nasal spray Place 2 sprays into both nostrils 2 (two) times daily. 12/27/20   Samella Parr, NP  furosemide (LASIX) 40 MG tablet Take 1 tablet (40 mg total) by mouth daily. 01/04/21   Samella Parr, NP  mometasone-formoterol (DULERA) 100-5 MCG/ACT AERO Inhale 2 puffs into the lungs 2 (two) times daily. 12/27/20   Samella Parr, NP  montelukast (SINGULAIR) 10 MG tablet Take 1 tablet (10 mg total) by mouth at bedtime. 08/18/20   Dwyane Dee, MD  nystatin (MYCOSTATIN/NYSTOP) powder Apply topically 3 (three) times daily. 12/27/20   Samella Parr, NP  potassium chloride SA (KLOR-CON) 20 MEQ tablet Take 1 tablet (20 mEq total) by mouth daily. 01/04/21   Samella Parr, NP  pregabalin (LYRICA) 100 MG capsule Take 1 capsule (100 mg total) by mouth 3 (three) times daily. 12/27/20   Samella Parr, NP  senna-docusate (SENOKOT-S) 8.6-50 MG tablet Take 2 tablets by mouth daily. 12/27/20   Samella Parr, NP  silver sulfADIAZINE (SILVADENE) 1 % cream Apply topically daily. 01/04/21   Samella Parr, NP  topiramate (TOPAMAX) 25 MG tablet Take 1 tablet (25 mg total)  by mouth 2 (two) times daily. 12/27/20   Samella Parr, NP  traMADol (ULTRAM) 50 MG tablet Take 1 tablet (50 mg total) by mouth every 6 (six) hours as needed for severe pain. 11/02/20   Shelly Coss, MD  ziprasidone (GEODON) 20 MG capsule Take 1 capsule (20 mg total) by  mouth daily. 01/04/21   Samella Parr, NP    Allergies    Amoxicillin, Bee venom, Penicillins, Penicillin g, Adhesive [tape], Latex, and Vancomycin  Review of Systems   Review of Systems  Gastrointestinal:  Positive for abdominal pain.   Physical Exam Updated Vital Signs BP 102/74   Pulse 78   Temp 98.3 F (36.8 C) (Oral)   Resp 16   SpO2 99%   Physical Exam  ED Results / Procedures / Treatments   Labs (all labs ordered are listed, but only abnormal results are displayed) Labs Reviewed  CBC WITH DIFFERENTIAL/PLATELET - Abnormal; Notable for the following components:      Result Value   WBC 18.3 (*)    Hemoglobin 11.5 (*)    MCH 24.9 (*)    MCHC 29.5 (*)    RDW 18.1 (*)    Platelets 453 (*)    Neutro Abs 14.3 (*)    Abs Immature Granulocytes 0.20 (*)    All other components within normal limits  COMPREHENSIVE METABOLIC PANEL - Abnormal; Notable for the following components:   BUN 44 (*)    Creatinine, Ser 1.62 (*)    Total Protein 9.1 (*)    GFR, Estimated 40 (*)    All other components within normal limits  ACETAMINOPHEN LEVEL - Abnormal; Notable for the following components:   Acetaminophen (Tylenol), Serum <10 (*)    All other components within normal limits  SALICYLATE LEVEL - Abnormal; Notable for the following components:   Salicylate Lvl <3.7 (*)    All other components within normal limits  RESP PANEL BY RT-PCR (FLU A&B, COVID) ARPGX2  ETHANOL  URINALYSIS, ROUTINE W REFLEX MICROSCOPIC  RAPID URINE DRUG SCREEN, HOSP PERFORMED  I-STAT BETA HCG BLOOD, ED ([facility], [facility], [facility] ONLY)    EKG None  Radiology No results found.  Procedures Ultrasound ED Peripheral IV (Provider)  Date/Time: 01/06/2021 4:22 PM Performed by: Regan Lemming, MD Authorized by: Regan Lemming, MD   Procedure details:    Indications: multiple failed IV attempts     Skin Prep: chlorhexidine gluconate     Location:  Left AC   Angiocath:  18 G   Bedside Ultrasound Guided: Yes      Images: not archived     Patient tolerated procedure without complications: Yes     Dressing applied: Yes   Ultrasound ED Peripheral IV (Provider)  Date/Time: 01/06/2021 4:22 PM Performed by: Regan Lemming, MD Authorized by: Regan Lemming, MD   Procedure details:    Indications: multiple failed IV attempts     Skin Prep: chlorhexidine gluconate     Location:  Right forearm   Angiocath:  20 G   Bedside Ultrasound Guided: Yes     Images: not archived     Patient tolerated procedure without complications: Yes     Dressing applied: Yes     Medications Ordered in ED Medications  buPROPion (WELLBUTRIN XL) 24 hr tablet 300 mg (has no administration in time range)  fluticasone (FLONASE) 50 MCG/ACT nasal spray 2 spray (has no administration in time range)  furosemide (LASIX) tablet 40 mg (has no administration in time range)  methocarbamol (ROBAXIN)  tablet 500 mg (has no administration in time range)  montelukast (SINGULAIR) tablet 10 mg (has no administration in time range)  pregabalin (LYRICA) capsule 100 mg (has no administration in time range)  rOPINIRole (REQUIP) tablet 4 mg (has no administration in time range)  topiramate (TOPAMAX) tablet 25 mg (has no administration in time range)  vitamin B-12 (CYANOCOBALAMIN) tablet 1,000 mcg (has no administration in time range)  ziprasidone (GEODON) capsule 20 mg (has no administration in time range)  ascorbic acid (VITAMIN C) tablet 500 mg (has no administration in time range)  potassium chloride SA (KLOR-CON) CR tablet 20 mEq (has no administration in time range)  senna-docusate (Senokot-S) tablet 2 tablet (has no administration in time range)  mometasone-formoterol (DULERA) 100-5 MCG/ACT inhaler 2 puff (has no administration in time range)  acetaminophen (TYLENOL) tablet 650 mg (650 mg Oral Given 01/06/21 1105)  sodium chloride 0.9 % bolus 500 mL (500 mLs Intravenous New Bag/Given 01/06/21 1551)  iohexol (OMNIPAQUE) 350 MG/ML injection 80 mL  (80 mLs Intravenous Contrast Given 01/06/21 1619)    ED Course  I have reviewed the triage vital signs and the nursing notes.  Pertinent labs & imaging results that were available during my care of the patient were reviewed by me and considered in my medical decision making (see chart for details).  Clinical Course as of 01/06/21 1621  Sat Jan 06, 2021  1051 Discussed case with Merlyn Lot with [facility] who recommends medical clearance labs and TTS evaluation to pysch clear patient.  [CA]  1052 Discussed with Arbie Cookey with TOC who is familiar with patient. She notes she exhausted all resources during patient's admission. Will discharge with outpatient resources pending psychiatric clearance.  [CA]    Clinical Course User Index [CA] Karie Kirks   MDM Rules/Calculators/A&P                            Final Clinical Impression(s) / ED Diagnoses Final diagnoses:  Midline low back pain    Rx / DC Orders ED Discharge Orders     None        Regan Lemming, MD 01/06/21 1622

## 2021-01-06 NOTE — ED Provider Notes (Signed)
[facility] DEPT Provider Note   CSN: [id number] Arrival date & time: 01/06/21  0907     History Chief Complaint  Patient presents with   Leg Pain    R Leg pain, burning   Abdominal Pain    L side   Suicidal    Heather [name] is a 45 y.o. female with a past medical history significant for morbid obesity, type 2 diabetes, OSA, COPD, MDD, GAD, insomnia, restless leg syndrome, history of renal mass who presents to the ED due to suicidal ideations and difficulties ambulating.  Patient admits to a plan to overdose on her sleeping pills.  Denies HI and auditory/visual hallucinations. Patient recently evaluated by psychiatry yesterday. Chart reviewed.  Patient was recently admitted to hospital on 4/40-3/47 due to complicated UTI and nonobstructive left nephrolithiasis. She completed her antibiotic course of cefepime yesterday. She was discharged from the hospital yesterday to [facility]. During her hospital stay, she underwent extensive PT/OT due to physical deconditioning. Patient states she slept in a chair last night which she believes worsened his back/RLE pain.  Denies saddle paresthesias, bowel/bladder incontinence, lower extremity numbness/tingling, lower extremity weakness, IV drug use, fever/chills. Patient told the RN that she felt like she was "dumped off to [facility]". No recent injury to low back or RLE. She has not tried anything for her symptoms. Pain worse with ambulation. Very difficult to obtain HPI because patient will not answer many of my questions.   History obtained from patient and past medical records. No interpreter used during encounter.       Past Medical History:  Diagnosis Date   Allergy    Amenorrhea    Anemia    post partum    Anxiety    Anxiety    Arthritis    Asthma    Back pain    Constipation    COPD (chronic obstructive pulmonary disease) ([place])    Delivery with history of C-section    Depression    Depression    Diabetes  mellitus without complication ([place])    patient denies but states she has hyperglycemia-diet controlled   Dysmenorrhea    Dysrhythmia    DR Johnsie Cancel     Ectopic pregnancy 2013   Edema, lower extremity    Eosinophilic esophagitis    Diagnosed at [facility] 06/16/2013, untreated   Gallbladder problem    GERD (gastroesophageal reflux disease)    HEARTBURN   TUMS   Hard to intubate 11/07/2015   High cholesterol    IBS (irritable bowel syndrome)    Leukocytosis 07/28/2008   Qualifier: Diagnosis of  By: Jonna Munro MD, Cornelius     Morbid obesity [facility])    Neuromuscular disorder ([place])    RESTLESS LEG    Obesity    Schizoaffective disorder, bipolar type ([place])    Sepsis ([place]) 11/11/2014   Shortness of breath    WITH EXERTION    Sleep apnea    CPAP- in process of restarting     Patient Active Problem List   Diagnosis Date Noted   Complicated UTI (urinary tract infection) 10/31/2020   Thrombocytosis 10/31/2020   Hypokalemia 10/31/2020   CKD (chronic kidney disease), stage III ([place]) 10/31/2020   Adjustment disorder with mixed anxiety and depressed mood 10/27/2020   Major depression, chronic 09/13/2020   Insomnia    Anemia of chronic disease    Hyponatremia    AKI (acute kidney injury) ([place])    Chronic bilateral low back pain without sciatica  Debility 08/24/2020   SIRS (systemic inflammatory response syndrome) ([place]) 08/18/2020   Pressure injury of skin 08/11/2020   Rhabdomyolysis 08/10/2020   Acute renal failure ([place]) 08/10/2020   PTSD (post-traumatic stress disorder) 05/15/2020   Major depressive disorder, recurrent episode, moderate ([place]) 05/05/2020   Generalized anxiety disorder 05/05/2020   Blood in stool    Gastritis and gastroduodenitis    Benign neoplasm of sigmoid colon    Difficult intubation 08/10/2018   Class 3 severe obesity with serious comorbidity and body mass index (BMI) greater than or equal to 70 in adult [facility]) 06/16/2018   Nexplanon insertion 03/27/2017    Postpartum hypertension 03/05/2017   Status post primary low transverse cesarean section 02/03/2017   H/O pre-eclampsia 01/27/2017   Gestational htn w/o significant proteinuria, third trimester 01/20/2017   Mild persistent asthma without complication 29/47/6546   Perennial allergic rhinitis 11/05/2016   Mild persistent asthma with acute exacerbation 11/05/2016   Excess weight gain in pregnancy, second trimester 10/30/2016   Hard to intubate 11/07/2015   Hypoglycemia 11/07/2015   OSA (obstructive sleep apnea) 11/07/2015   DM type 2 (diabetes mellitus, type 2) ([place]) 12/07/2014   Panniculitis 12/06/2014   Cellulitis, abdominal wall 11/11/2014   Abdominal pain 07/14/2014   Loose stools 07/14/2014   Melena 50/35/4656   Eosinophilic esophagitis 81/27/5170   Change in bowel habits 04/28/2013   Esophageal dysphagia 04/28/2013   Insomnia due to mental disorder(327.02) 08/08/2011   RLS (restless legs syndrome) 08/08/2011   PALPITATIONS, OCCASIONAL 11/01/2009   Leucocytosis 07/28/2008   ALLERGIC RHINITIS, SEASONAL 01/74/9449   DYSMETABOLIC SYNDROME 67/59/1638   Morbid obesity ([place]) 05/13/2006   EXTERNAL HEMORRHOIDS 05/13/2006   HYPERLIPIDEMIA 05/12/2006   Essential hypertension 05/12/2006   Asthma 05/12/2006   OSTEOARTHRITIS 05/12/2006    Past Surgical History:  Procedure Laterality Date   BIOPSY  08/13/2018   Procedure: BIOPSY;  Surgeon: Yetta Flock, MD;  Location: [facility];  Service: Gastroenterology;;   CESAREAN SECTION MULTI-GESTATIONAL N/A 02/03/2017   Procedure: CESAREAN SECTION MULTI-GESTATIONAL;  Surgeon: Jonnie Kind, MD;  Location: [facility];  Service: Obstetrics;  Laterality: N/A;   CHOLECYSTECTOMY     COLONOSCOPY WITH PROPOFOL N/A 08/13/2018   Procedure: COLONOSCOPY WITH PROPOFOL;  Surgeon: Yetta Flock, MD;  Location: [facility];  Service: Gastroenterology;  Laterality: N/A;   DENTAL SURGERY     ESOPHAGOGASTRODUODENOSCOPY  May 2007   Dr.  Gala Romney: Normal esophagus, stomach, D1, D2   ESOPHAGOGASTRODUODENOSCOPY  06/16/2013   Dr. Carlton Adam, eosinophilic esophagitis, reactive gastropathy, no esophageal dilation   ESOPHAGOGASTRODUODENOSCOPY (EGD) WITH PROPOFOL N/A 08/13/2018   Procedure: ESOPHAGOGASTRODUODENOSCOPY (EGD) WITH PROPOFOL;  Surgeon: Yetta Flock, MD;  Location: [facility];  Service: Gastroenterology;  Laterality: N/A;   POLYPECTOMY  08/13/2018   Procedure: POLYPECTOMY;  Surgeon: Yetta Flock, MD;  Location: [facility];  Service: Gastroenterology;;   TONSILLECTOMY     TOOTH EXTRACTION  10/28/2011   Procedure: DENTAL RESTORATION/EXTRACTIONS;  Surgeon: Gae Bon, DDS;  Location: [facility];  Service: Oral Surgery;;   UPPER GASTROINTESTINAL ENDOSCOPY       OB History     Gravida  2   Para  1   Term  1   Preterm      AB  1   Living  2      SAB  0   IAB      Ectopic  1   Multiple  1   Live Births  2  Family History  Problem Relation Age of Onset   Depression Mother    Anxiety disorder Mother    High blood pressure Mother    Bipolar disorder Mother    Eating disorder Mother    Obesity Mother    Hypertension Sister    Allergic rhinitis Sister    Colon polyps Maternal Grandmother        49s   Diabetes Maternal Grandmother    Anxiety disorder Maternal Grandmother    COPD Maternal Grandmother    Crohn's disease Maternal Aunt    Cancer Maternal Grandfather        prostate   HIV/AIDS Father    Eating disorder Father    Obesity Father    Liver disease Neg Hx    Angioedema Neg Hx    Eczema Neg Hx    Immunodeficiency Neg Hx    Asthma Neg Hx    Urticaria Neg Hx    Colon cancer Neg Hx    Esophageal cancer Neg Hx    Rectal cancer Neg Hx    Stomach cancer Neg Hx     Social History   Tobacco Use   Smoking status: Former    Packs/day: 0.50    Years: 8.00    Pack years: 4.00    Types: Cigarettes    Quit date: 04/25/2011    Years since quitting: 9.7   Smokeless  tobacco: Never  Vaping Use   Vaping Use: Never used  Substance Use Topics   Alcohol use: No   Drug use: No    Home Medications Prior to Admission medications   Medication Sig Start Date End Date Taking? Authorizing Provider  methocarbamol (ROBAXIN) 500 MG tablet Take 1 tablet (500 mg total) by mouth 4 (four) times daily. 12/27/20  Yes Samella Parr, NP  oxyCODONE (OXY IR/ROXICODONE) 5 MG immediate release tablet Take 1 tablet (5 mg total) by mouth every 4 (four) hours as needed for moderate pain. 12/27/20  Yes Samella Parr, NP  rOPINIRole (REQUIP) 4 MG tablet Take 1 tablet (4 mg total) by mouth at bedtime. 12/27/20  Yes Samella Parr, NP  traZODone (DESYREL) 50 MG tablet Take 1 tablet (50 mg total) by mouth at bedtime. 12/27/20  Yes Samella Parr, NP  acetaminophen (TYLENOL) 325 MG tablet Take 650 mg by mouth every 6 (six) hours as needed for mild pain, fever or headache.    [provider]  albuterol (VENTOLIN HFA) 108 (90 Base) MCG/ACT inhaler Inhale 2 puffs into the lungs every 4 (four) hours as needed for wheezing or shortness of breath. 12/27/20   Samella Parr, NP  ascorbic acid (VITAMIN C) 500 MG tablet Take 1 tablet (500 mg total) by mouth daily. 12/27/20   Samella Parr, NP  buPROPion (WELLBUTRIN XL) 300 MG 24 hr tablet Take 1 tablet (300 mg total) by mouth daily. 01/04/21   Samella Parr, NP  clonazePAM (KLONOPIN) 0.5 MG tablet Take 1/2 tablet (0.25 mg total) by mouth daily as needed (panic attacks). 01/04/21   Samella Parr, NP  clotrimazole (LOTRIMIN) 1 % cream Apply topically 2 (two) times daily. 11/02/20   Shelly Coss, MD  vitamin B-12 (CYANOCOBALAMIN) 1000 MCG tablet Take 1 tablet (1,000 mcg total) by mouth daily. 12/27/20   Samella Parr, NP  diclofenac Sodium (VOLTAREN) 1 % GEL Apply 4 g topically 4 (four) times daily. 12/27/20   Samella Parr, NP  fluticasone (FLONASE) 50 MCG/ACT nasal spray Place 2 sprays  into both nostrils 2 (two) times  daily. 12/27/20   Samella Parr, NP  furosemide (LASIX) 40 MG tablet Take 1 tablet (40 mg total) by mouth daily. 01/04/21   Samella Parr, NP  mometasone-formoterol (DULERA) 100-5 MCG/ACT AERO Inhale 2 puffs into the lungs 2 (two) times daily. 12/27/20   Samella Parr, NP  montelukast (SINGULAIR) 10 MG tablet Take 1 tablet (10 mg total) by mouth at bedtime. 08/18/20   Dwyane Dee, MD  nystatin (MYCOSTATIN/NYSTOP) powder Apply topically 3 (three) times daily. 12/27/20   Samella Parr, NP  potassium chloride SA (KLOR-CON) 20 MEQ tablet Take 1 tablet (20 mEq total) by mouth daily. 01/04/21   Samella Parr, NP  pregabalin (LYRICA) 100 MG capsule Take 1 capsule (100 mg total) by mouth 3 (three) times daily. 12/27/20   Samella Parr, NP  senna-docusate (SENOKOT-S) 8.6-50 MG tablet Take 2 tablets by mouth daily. 12/27/20   Samella Parr, NP  silver sulfADIAZINE (SILVADENE) 1 % cream Apply topically daily. 01/04/21   Samella Parr, NP  topiramate (TOPAMAX) 25 MG tablet Take 1 tablet (25 mg total) by mouth 2 (two) times daily. 12/27/20   Samella Parr, NP  traMADol (ULTRAM) 50 MG tablet Take 1 tablet (50 mg total) by mouth every 6 (six) hours as needed for severe pain. 11/02/20   Shelly Coss, MD  ziprasidone (GEODON) 20 MG capsule Take 1 capsule (20 mg total) by mouth daily. 01/04/21   Samella Parr, NP    Allergies    Amoxicillin, Bee venom, Penicillins, Penicillin g, Adhesive [tape], Latex, and Vancomycin  Review of Systems   Review of Systems  Constitutional:  Negative for chills and fever.  Respiratory:  Negative for shortness of breath.   Cardiovascular:  Negative for chest pain.  Musculoskeletal:  Positive for arthralgias, back pain and gait problem.  Neurological:  Negative for weakness and numbness.  Psychiatric/Behavioral:  Positive for suicidal ideas.   All other systems reviewed and are negative.  Physical Exam Updated Vital Signs BP 114/64   Pulse 75   Temp  98.3 F (36.8 C) (Oral)   Resp 16   SpO2 96%   Physical Exam Vitals and nursing note reviewed.  Constitutional:      General: She is not in acute distress.    Appearance: She is not ill-appearing.  HENT:     Head: Normocephalic.  Eyes:     Pupils: Pupils are equal, round, and reactive to light.  Cardiovascular:     Rate and Rhythm: Normal rate and regular rhythm.     Pulses: Normal pulses.     Heart sounds: Normal heart sounds. No murmur heard.   No friction rub. No gallop.  Pulmonary:     Effort: Pulmonary effort is normal.     Breath sounds: Normal breath sounds.  Abdominal:     General: Abdomen is flat. There is no distension.     Palpations: Abdomen is soft.     Tenderness: There is no abdominal tenderness. There is no guarding or rebound.  Musculoskeletal:        General: Normal range of motion.     Cervical back: Neck supple.     Comments: Bilateral lower extremities neurovascularly intact with soft compartments. No lumbar midline tenderness.   Skin:    General: Skin is warm and dry.  Neurological:     General: No focal deficit present.     Mental Status: She is alert.  Psychiatric:  Mood and Affect: Mood normal.        Behavior: Behavior normal.        Thought Content: Thought content includes suicidal ideation. Thought content includes suicidal plan.    ED Results / Procedures / Treatments   Labs (all labs ordered are listed, but only abnormal results are displayed) Labs Reviewed  CBC WITH DIFFERENTIAL/PLATELET - Abnormal; Notable for the following components:      Result Value   WBC 18.3 (*)    Hemoglobin 11.5 (*)    MCH 24.9 (*)    MCHC 29.5 (*)    RDW 18.1 (*)    Platelets 453 (*)    Neutro Abs 14.3 (*)    Abs Immature Granulocytes 0.20 (*)    All other components within normal limits  COMPREHENSIVE METABOLIC PANEL - Abnormal; Notable for the following components:   BUN 44 (*)    Creatinine, Ser 1.62 (*)    Total Protein 9.1 (*)    GFR,  Estimated 40 (*)    All other components within normal limits  ACETAMINOPHEN LEVEL - Abnormal; Notable for the following components:   Acetaminophen (Tylenol), Serum <10 (*)    All other components within normal limits  SALICYLATE LEVEL - Abnormal; Notable for the following components:   Salicylate Lvl <6.7 (*)    All other components within normal limits  RESP PANEL BY RT-PCR (FLU A&B, COVID) ARPGX2  ETHANOL  URINALYSIS, ROUTINE W REFLEX MICROSCOPIC  RAPID URINE DRUG SCREEN, HOSP PERFORMED  I-STAT BETA HCG BLOOD, ED ([facility], [facility], [facility] ONLY)    EKG None  Radiology No results found.  Procedures Procedures   Medications Ordered in ED Medications  buPROPion (WELLBUTRIN XL) 24 hr tablet 300 mg (300 mg Oral Given 01/06/21 1649)  fluticasone (FLONASE) 50 MCG/ACT nasal spray 2 spray (has no administration in time range)  furosemide (LASIX) tablet 40 mg (40 mg Oral Given 01/06/21 1650)  methocarbamol (ROBAXIN) tablet 500 mg (500 mg Oral Given 01/06/21 1650)  montelukast (SINGULAIR) tablet 10 mg (has no administration in time range)  pregabalin (LYRICA) capsule 100 mg (100 mg Oral Given 01/06/21 1649)  rOPINIRole (REQUIP) tablet 4 mg (has no administration in time range)  topiramate (TOPAMAX) tablet 25 mg (25 mg Oral Given 01/06/21 1650)  vitamin B-12 (CYANOCOBALAMIN) tablet 1,000 mcg (1,000 mcg Oral Given 01/06/21 1649)  ziprasidone (GEODON) capsule 20 mg (20 mg Oral Given 01/06/21 1653)  ascorbic acid (VITAMIN C) tablet 500 mg (500 mg Oral Given 01/06/21 1650)  potassium chloride SA (KLOR-CON) CR tablet 20 mEq (20 mEq Oral Given 01/06/21 1650)  senna-docusate (Senokot-S) tablet 2 tablet (2 tablets Oral Given 01/06/21 1649)  mometasone-formoterol (DULERA) 100-5 MCG/ACT inhaler 2 puff (has no administration in time range)  acetaminophen (TYLENOL) tablet 650 mg (650 mg Oral Given 01/06/21 1105)  sodium chloride 0.9 % bolus 500 mL (500 mLs Intravenous New Bag/Given 01/06/21 1551)  iohexol (OMNIPAQUE) 350  MG/ML injection 80 mL (80 mLs Intravenous Contrast Given 01/06/21 1619)    ED Course  I have reviewed the triage vital signs and the nursing notes.  Pertinent labs & imaging results that were available during my care of the patient were reviewed by me and considered in my medical decision making (see chart for details).  Clinical Course as of 01/06/21 1740  Sat Jan 06, 2021  1051 Discussed case with Merlyn Lot with [facility] who recommends medical clearance labs and TTS evaluation to pysch clear patient.  [CA]  1052 Discussed with Arbie Cookey  with TOC who is familiar with patient. She notes she exhausted all resources during patient's admission. Will discharge with outpatient resources pending psychiatric clearance.  [CA]    Clinical Course User Index [CA] Suzy Bouchard, PA-C   MDM Rules/Calculators/A&P                          45 year old female presents to the ED due to suicidal ideations and right lower extremity pain.  Patient recently admitted to the hospital on 8/83-2/54 due to complicated UTI where she underwent extensive OT/PT due to deconditioning.  Patient is currently homeless and was discharged to [facility] yesterday.  Upon arrival, vitals all within normal limits.  Patient nontoxic-appearing.  Reassuring physical exam. Some mild diffuse abdominal tenderness.  Bilateral lower extremities neurovascularly intact with soft compartments.  Low suspicion for cauda equina or central cord compression. Discussed case with Merlyn Lot with [facility], see note above. Medical clearance labs ordered. Feel there is some underlying malingering. Tylenol given for leg pain. Leg pain appears to be chronic in nature per previous notes. Discussed case with Dr. Johnney Killian who evaluated patient at bedside and agrees with assessment and plan. Discussed with TOC, see note above.   CBC significant for significant leukocytosis at 18.3.  Anemia with hemoglobin at 11.5. Leukocytosis appears to have worsened over the past few  days.  Given patient's abdominal pain and back pain, will obtain CT abdomen with L-spine to rule out infectious etiology due to leukocytosis.  COVID/influenza negative.  Ethanol, acetaminophen, salicylate level within normal limits.  CMP significant for elevated creatinine 1.62 and BUN of 44.  IV fluids given.  CT lumbar spine personally reviewed which is negative for any acute abnormalities.  Severe lower lumbar facet arthrosis with severe bilateral neural foraminal stenosis at L5-S1. UA concerning for UTI. Patient just finished cefepime yesterday for UTI. Urine culture pending. Will treat with Keflex pending culture per previous urine culture. Patient has an anaphylactic reaction to PCN; however, it appears patient has tolerate Keflex in the past with no reaction.   Psychiatry recommends overnight observation with reassessment in the AM. Home medications ordered.   Patient handed off to [facility] pending CT abdomen results. If negative, patient can be medically cleared for psychiatry re-evaluation in the morning.  Final Clinical Impression(s) / ED Diagnoses Final diagnoses:  Midline low back pain  Suicidal ideation  Generalized abdominal pain    Rx / DC Orders ED Discharge Orders     None        Suzy Bouchard, PA-C 01/06/21 1754    Charlesetta Shanks, MD 01/22/21 1623

## 2021-01-06 NOTE — ED Provider Notes (Signed)
Accepted handoff at shift change from [place]. PA-C. Please see prior provider note for more detail.   Briefly: Patient is 45 y.o.   DDX: concern for UTI or pyelonephritis  Plan: Urology consult pending CT results   Physical Exam  BP 109/68   Pulse 80   Temp 98.3 F (36.8 C) (Oral)   Resp 18   SpO2 99%   Physical Exam  ED Course/Procedures   Procedures  MR LUMBAR SPINE WO CONTRAST  Result Date: 12/12/2020 CLINICAL DATA:  Low back pain, known prior T12 and lumbar disc herniation EXAM: MRI LUMBAR SPINE WITHOUT CONTRAST TECHNIQUE: Multiplanar, multisequence MR imaging of the lumbar spine was performed. No intravenous contrast was administered. COMPARISON:  Lumbar spine MRI 11/04/2018, CT lumbar spine 08/18/2020 FINDINGS: Segmentation:  Standard. Alignment: Grade 1 anterolisthesis of L5 on S1 is unchanged. Alignment is otherwise normal. Vertebrae: Vertebral body heights are preserved. There is no evidence of acute fracture. Marrow signal is diffusely hypointense on T1 and T2 images. There is mild degenerative endplate signal abnormality at L4-L5 and L5-S1. There is a suspected unilateral right L5-S1 pars defect as seen on prior CT. Conus medullaris and cauda equina: Conus extends to the mid L1 level. Conus and cauda equina appear normal. Paraspinal and other soft tissues: There is a 7.8 cm right renal cyst with complex internal septations. This is not significantly changed in size since 2019. The soft tissues are otherwise unremarkable. Disc levels: There is multilevel facet arthropathy, most advanced at L5-S1 with prominent bilateral effusions, increased since 2020. There is multilevel disc desiccation and narrowing, most advanced at L4-L5. T12-L1: There is a right paracentral disc extrusion with superior migration without significant spinal canal or neural foraminal stenosis. The extrusion is decreased in size since 2020. L1-L2: There is a mild disc bulge and bilateral facet arthropathy  without significant spinal canal or neural foraminal stenosis. L2-L3: There is a diffuse disc bulge with a left subarticular zone inferiorly migrated extrusion and bilateral facet arthropathy resulting in moderate to severe spinal canal stenosis with probable impingement of the traversing left cauda equina nerve roots. These findings are not significantly changed since 2020. L3-L4: There is a shallow disc bulge and bilateral facet arthropathy without significant spinal canal or neural foraminal stenosis. L4-L5: There is a mild disc bulge, degenerative endplate change, and bilateral facet arthropathy resulting in mild left worse than right neural foraminal stenosis without significant spinal canal stenosis. L5-S1: There is degenerative endplate change and bilateral facet arthropathy superimposed on grade 1 anterolisthesis resulting in severe bilateral neural foraminal stenosis without significant spinal canal stenosis, unchanged. IMPRESSION: 1. Left subarticular zone disc extrusion at L2-L3 resulting in moderate to severe spinal canal stenosis with impingement of the traversing left cauda equina nerve roots, unchanged since 2020. 2. Decreased size of the superiorly migrated disc extrusion at T12-L1. No significant spinal canal or neural foraminal stenosis at this level. 3. Severe bilateral neural foraminal stenosis at L5-S1, unchanged. 4. Advanced bilateral facet arthropathy at L5-S1 with prominent effusions, increased since 2020. 5. Diffusely abnormal T1 hypointensity throughout the bone marrow is unchanged going back to 2019. This is nonspecific and can be seen in the setting of chronic hypoxia (including smoking), anemia, or less likely lymphoproliferative disorder given chronicity. 6. No significant interval change in size of the complex right renal mass dating back to 2019. Slow-growing neoplasm is still not excluded. Electronically Signed   By: Valetta Mole M.D.   On: 12/12/2020 15:01   CT ABDOMEN PELVIS W  CONTRAST  Result Date: 01/06/2021 CLINICAL DATA:  Diverticulitis suspected. Lower abdominal pain and back pain. EXAM: CT ABDOMEN AND PELVIS WITH CONTRAST TECHNIQUE: Multidetector CT imaging of the abdomen and pelvis was performed using the standard protocol following bolus administration of intravenous contrast. CONTRAST:  65mL OMNIPAQUE IOHEXOL 350 MG/ML SOLN COMPARISON:  CT abdomen and pelvis 10/24/2020. FINDINGS: Lower chest: No acute abnormality. Hepatobiliary: The liver is enlarged, unchanged. Patient is status post cholecystectomy. There is no biliary ductal dilatation. Pancreas: Unremarkable. No pancreatic ductal dilatation or surrounding inflammatory changes. Spleen: Normal in size without focal abnormality. Adrenals/Urinary Tract: Left-sided staghorn calculus appears unchanged from the prior examination. There is no left-sided hydronephrosis. Right renal calculi including a right renal pelvic calculus measuring 18 mm appear unchanged from the prior examination. There is no right-sided hydronephrosis. There is an 8.1 cm cyst in the superior pole of the right kidney which is unchanged from the prior examination. The bladder is within normal limits. Stomach/Bowel: Stomach is within normal limits. Appendix is not seen. No evidence of bowel wall thickening, distention, or inflammatory changes. There is a large amount of stool throughout the colon and within the rectum. Vascular/Lymphatic: No significant vascular findings are present. No enlarged abdominal or pelvic lymph nodes. Reproductive: Uterus and bilateral adnexa are unremarkable. Other: No abdominal wall hernia or abnormality. No abdominopelvic ascites. Musculoskeletal: Multilevel degenerative changes affect the spine. IMPRESSION: 1. No acute localizing process in the abdomen or pelvis. 2. Large amount of stool throughout the colon and within the rectum. 3. Stable bilateral renal calculi including left-sided staghorn calculus. Electronically Signed    By: Ronney Asters M.D.   On: 01/06/2021 17:51   US RENAL  Result Date: 12/29/2020 CLINICAL DATA:  Nephrolithiasis. EXAM: RENAL / URINARY TRACT ULTRASOUND COMPLETE COMPARISON:  Aug 14, 2020 FINDINGS: Right Kidney: Renal measurements: 16.9 cm x 7.2 cm x 6.9 cm = volume: 436.0 mL. Echogenicity within normal limits. An 8.5 cm x 7.2 cm x 7.4 cm heterogeneous, hypoechoic mass is seen within the upper pole of the right kidney. No abnormal flow is noted within this region on color Doppler evaluation. No hydronephrosis is visualized. Left Kidney: Renal measurements: 13.9 cm x 6.8 cm x 5.5 cm = volume: 271.4 mL. Echogenicity within normal limits. 1.4 cm and 2.1 cm shadowing, echogenic foci are seen within the left kidney. These are present on the prior study. No mass or hydronephrosis visualized. Bladder: Urinary bladder is poorly visualized. Other: It should be noted that the study is limited in evaluation secondary to the patient's body habitus. IMPRESSION: 1. Stable right upper pole renal mass. MRI correlation is recommended, as an underlying neoplastic process can not be excluded. 2. Nonobstructing left renal calculi. Electronically Signed   By: Virgina Norfolk M.D.   On: 12/29/2020 19:30   CT L-SPINE NO CHARGE  Result Date: 01/06/2021 CLINICAL DATA:  Midline low back pain. EXAM: CT LUMBAR SPINE WITH CONTRAST TECHNIQUE: Technique: Multiplanar CT images of the lumbar spine were reconstructed from contemporary CT of the Abdomen and Pelvis. CONTRAST:  No additional COMPARISON:  Lumbar spine MRI 12/12/2020 FINDINGS: Image quality is significantly degraded by body habitus. Segmentation: 5 lumbar type vertebrae. Alignment: Exaggerated lumbar lordosis. Unchanged grade 1 anterolisthesis of L5 on S1. unilateral chronic right-sided L5 pars defect. Vertebrae: No acute fracture is identified although assessment is limited by image noise. Paraspinal and other soft tissues: No acute abnormality identified in the paraspinal  soft tissues. Intra-and pelvic contents reported separately. Disc levels: Disc degeneration throughout the  lumbar and included lower thoracic spine including prominent degenerative endplate changes and vacuum disc at T12-L1. Moderate disc space narrowing at L4-5 and L5-S1 with vacuum disc. Poor visualization of the spinal canal due to study limitations precluding assessment of the L2-3 disc extrusion shown on the recent prior MRI. Severe facet arthrosis at L4-5 and L5-S1 with mild-to-moderate neural foraminal stenosis at L4-5 and severe neural foraminal stenosis at L5-S1. IMPRESSION: 1. Limited examination due to body habitus. No acute osseous abnormality identified. 2. Severe lower lumbar facet arthrosis with severe bilateral neural foraminal stenosis at L5-S1. Electronically Signed   By: Logan Bores M.D.   On: 01/06/2021 17:19   DG Knee 4 Views W/Patella Right  Result Date: 01/04/2021 CLINICAL DATA:  Knee pain EXAM: RIGHT KNEE - COMPLETE 4+ VIEW COMPARISON:  08/10/2020 FINDINGS: Negative for fracture. Evaluation of joint effusion limited by patient positioning Tricompartmental degenerative change with joint space narrowing and spurring. There is widening of the medial joint space with narrowing of the lateral joint space. Bilateral spurring. Patellofemoral joint space narrowing and spurring. IMPRESSION: Negative for fracture. Advanced tricompartmental degenerative change. Electronically Signed   By: Franchot Gallo M.D.   On: 01/04/2021 14:26     Results for orders placed or performed during the hospital encounter of 01/06/21 (from the past 24 hour(s))  CBC with Differential     Status: Abnormal   Collection Time: 01/06/21 11:05 AM  Result Value Ref Range   WBC 18.3 (H) 4.0 - 10.5 K/uL   RBC 4.62 3.87 - 5.11 MIL/uL   Hemoglobin 11.5 (L) 12.0 - 15.0 g/dL   HCT 39.0 36.0 - 46.0 %   MCV 84.4 80.0 - 100.0 fL   MCH 24.9 (L) 26.0 - 34.0 pg   MCHC 29.5 (L) 30.0 - 36.0 g/dL   RDW 18.1 (H) 11.5 - 15.5 %    Platelets 453 (H) 150 - 400 K/uL   nRBC 0.0 0.0 - 0.2 %   Neutrophils Relative % 78 %   Neutro Abs 14.3 (H) 1.7 - 7.7 K/uL   Lymphocytes Relative 15 %   Lymphs Abs 2.7 0.7 - 4.0 K/uL   Monocytes Relative 5 %   Monocytes Absolute 0.9 0.1 - 1.0 K/uL   Eosinophils Relative 1 %   Eosinophils Absolute 0.2 0.0 - 0.5 K/uL   Basophils Relative 0 %   Basophils Absolute 0.1 0.0 - 0.1 K/uL   Immature Granulocytes 1 %   Abs Immature Granulocytes 0.20 (H) 0.00 - 0.07 K/uL  Comprehensive metabolic panel     Status: Abnormal   Collection Time: 01/06/21 11:05 AM  Result Value Ref Range   Sodium 139 135 - 145 mmol/L   Potassium 4.2 3.5 - 5.1 mmol/L   Chloride 104 98 - 111 mmol/L   CO2 24 22 - 32 mmol/L   Glucose, Bld 97 70 - 99 mg/dL   BUN 44 (H) 6 - 20 mg/dL   Creatinine, Ser 1.62 (H) 0.44 - 1.00 mg/dL   Calcium 10.1 8.9 - 10.3 mg/dL   Total Protein 9.1 (H) 6.5 - 8.1 g/dL   Albumin 3.9 3.5 - 5.0 g/dL   AST 17 15 - 41 U/L   ALT 19 0 - 44 U/L   Alkaline Phosphatase 66 38 - 126 U/L   Total Bilirubin 0.4 0.3 - 1.2 mg/dL   GFR, Estimated 40 (L) >60 mL/min   Anion gap 11 5 - 15  Ethanol     Status: None   Collection Time: 01/06/21  11:05 AM  Result Value Ref Range   Alcohol, Ethyl (B) <10 <10 mg/dL  Acetaminophen level     Status: Abnormal   Collection Time: 01/06/21 11:05 AM  Result Value Ref Range   Acetaminophen (Tylenol), Serum <10 (L) 10 - 30 ug/mL  Salicylate level     Status: Abnormal   Collection Time: 01/06/21 11:05 AM  Result Value Ref Range   Salicylate Lvl <3.7 (L) 7.0 - 30.0 mg/dL  Resp Panel by RT-PCR (Flu A&B, Covid) Nasopharyngeal Swab     Status: None   Collection Time: 01/06/21 11:29 AM   Specimen: Nasopharyngeal Swab; Nasopharyngeal(NP) swabs in vial transport medium  Result Value Ref Range   SARS Coronavirus 2 by RT PCR NEGATIVE NEGATIVE   Influenza A by PCR NEGATIVE NEGATIVE   Influenza B by PCR NEGATIVE NEGATIVE  I-Stat beta hCG blood, ED     Status: None    Collection Time: 01/06/21 11:35 AM  Result Value Ref Range   I-stat hCG, quantitative <5.0 <5 mIU/mL   Comment 3          Urinalysis, Routine w reflex microscopic Urine, Clean Catch     Status: Abnormal   Collection Time: 01/06/21  4:47 PM  Result Value Ref Range   Color, Urine YELLOW YELLOW   APPearance CLOUDY (A) CLEAR   Specific Gravity, Urine 1.018 1.005 - 1.030   pH 5.0 5.0 - 8.0   Glucose, UA NEGATIVE NEGATIVE mg/dL   Hgb urine dipstick MODERATE (A) NEGATIVE   Bilirubin Urine NEGATIVE NEGATIVE   Ketones, ur 5 (A) NEGATIVE mg/dL   Protein, ur 100 (A) NEGATIVE mg/dL   Nitrite NEGATIVE NEGATIVE   Leukocytes,Ua LARGE (A) NEGATIVE   RBC / HPF >50 (H) 0 - 5 RBC/hpf   WBC, UA >50 (H) 0 - 5 WBC/hpf   Bacteria, UA MANY (A) NONE SEEN   Squamous Epithelial / LPF 0-5 0 - 5  Urine rapid drug screen (hosp performed)     Status: None   Collection Time: 01/06/21  4:47 PM  Result Value Ref Range   Opiates NONE DETECTED NONE DETECTED   Cocaine NONE DETECTED NONE DETECTED   Benzodiazepines NONE DETECTED NONE DETECTED   Amphetamines NONE DETECTED NONE DETECTED   Tetrahydrocannabinol NONE DETECTED NONE DETECTED   Barbiturates NONE DETECTED NONE DETECTED     MDM   This patient was handed off to me during shift change.  Please see previous note for more information on the patient.  Plan at time of handoff was for possible admission pending CT.  CT abdomen showed stable bilateral renal calculi including left-sided staghorn calculus.  Consult to urology placed. Discussed CT findings and leukocytosis with Dr. Alyson Ingles from urology. He discussed that a ureter stent is not needed and to follow up with a urine culture given patient's recent completion of cefepime and discharge. Urine culture ordered. Patient is asymptomatic with non-specific abdominal pain. No dysuria or hematuria. Pt is afebrile in the ED. Patient does not meet SIRs criteria. Patient is ambulatory with nursing staff. Due to patient's  SI, she will be put on observation. The patient is medically clear for psych observation.       Sherrell Puller, PA-C 01/06/21 2046    Hayden Rasmussen, MD 01/07/21 [phone]

## 2021-01-06 NOTE — ED Notes (Signed)
Per Chart Review, the Pt has been homeless since May and was admitted to [facility] in late July for UTI.  TOC at [facility] exhausted all resources and the Pt was discharged to [facility] yesterday after completing last round of IV antibiotics.    EMS reports Pt slept in a chair because she could not stand.  Pt continues to report she cannot stand.

## 2021-01-06 NOTE — ED Notes (Signed)
Pt. Sleeping and no breathing difficulties. 

## 2021-01-06 NOTE — ED Notes (Addendum)
Patient is resting

## 2021-01-06 NOTE — ED Notes (Signed)
Patient given sandwich and drink.  

## 2021-01-06 NOTE — ED Notes (Signed)
ED Provider at bedside. IV team

## 2021-01-06 NOTE — ED Triage Notes (Signed)
Patient BIBA from [facility].  Was DCed from [facility] where she was under care for complicated UTI yesterday, says it was d/t inability to find placement.  Complains of new onset R leg pain, and L abd pain.  Per EMS vitals WDL.

## 2021-01-06 NOTE — Progress Notes (Signed)
CSW very familiar with patient as she was recently discharged from [facility] yesterday from DTP list.  [place] spoke with [place], [place] regarding patient and efforts for placement that were unsuccessful. CSW unable to provide any additional resources for patient at this time.  [name], MSW, LCSW Transitions of Care  Clinical Social Worker II [phone]

## 2021-01-06 NOTE — ED Notes (Signed)
Attempted IV without success. Primary RN aware.

## 2021-01-06 NOTE — ED Notes (Signed)
ED Provider at bedside. [place] PA

## 2021-01-06 NOTE — ED Notes (Signed)
TelePsych at bedside.

## 2021-01-06 NOTE — ED Notes (Signed)
Unsuccessful lab draw x2.  Department phlebotomist asked to attempt.

## 2021-01-06 NOTE — Progress Notes (Signed)
Pt has very poor vasculature. Veins are very deep over 2cm, and the remaining vein is sclerosed and not easily compressible. Attempted a PIV under U/S and NBR.Removed and primary RN notified.
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
## 2021-01-06 NOTE — ED Notes (Signed)
Pt had moderate difficulty getting in and out of bed.  Of note, Pt is currently on a Sizewise bed w/ an air mattress.   Pt was able to ambulate w/ a walker w/ mild difficulty.  Pt ambulated around 42ft.  Pt c/o chronic back, leg, and knee pain.

## 2021-01-07 DIAGNOSIS — F332 Major depressive disorder, recurrent severe without psychotic features: Secondary | ICD-10-CM

## 2021-01-07 MED ORDER — ACETAMINOPHEN 325 MG PO TABS
650.0000 mg | ORAL_TABLET | Freq: Once | ORAL | Status: AC
  Administered 2021-01-07: 650 mg via ORAL
  Filled 2021-01-07: qty 2

## 2021-01-07 NOTE — Consult Note (Signed)
Telepsych Consultation   Reason for Consult:  Psychiatric Reassessment Referring Physician:  EDP Location of Patient:   [facility] Location of Provider: Other: virtual home office  Patient Identification: Heather [name] MRN:  [id number] Principal Diagnosis: Major depressive disorder, recurrent ([place]) Diagnosis:  Principal Problem:   Major depressive disorder, recurrent ([place]) Active Problems:   Adjustment disorder   Total Time spent with patient: 30 minutes  Subjective:   Heather [name] is a 45 y.o. female patient admitted with suicidal ideations with plan to overdose on pills.  Psychiatry recommended overnight observation for med mgmt and monitoring. Patient states, "I feel a little better."  . Patient seen via telepsych by this provider; chart reviewed and consulted with Dr. Dwyane Dee on 01/07/21.  On evaluation Heather [name] reports she's physically incapacitated and hopeless because she is homeless.  Homelessness triggers suicidal ideations with a plan to overdose on pills she's been saving.  She tells me today she lost her home several years ago, after encountering hard times in the height of the covid pandemic.  She does not have family support, and no friends who can help her.  She has multiple medical co-morbidities, morbid obesity, DM, OSA, chronic renal concerns, etc., and limited mobility.  States he is scared to be on the streets in her current condition.  She takes wellbutrin and geodon feels meds help her depression, but she remains suicidal today because her housing situation has not changed. States she was previously referred to [facility] but relates she was offered a chair to sleep on and that was for only 24 hours stay.  She wants to go to a SNF or assistive facility.     Per SW notes, it appears they have reached out to several facilities trying to locate suitable housing placement.    HPI Per EDP Admission Assessment 01/05/2021:   Chief Complaint  Patient presents  with   Leg Pain      R Leg pain, burning   Abdominal Pain      L side   Suicidal      Heather [name] is a 45 y.o. female with a past medical history significant for morbid obesity, type 2 diabetes, OSA, COPD, MDD, GAD, insomnia, restless leg syndrome, history of renal mass who presents to the ED due to suicidal ideations and difficulties ambulating.  Patient admits to a plan to overdose on her sleeping pills.  Denies HI and auditory/visual hallucinations. Patient recently evaluated by psychiatry yesterday. Chart reviewed.  Patient was recently admitted to hospital on 3/84-6/65 due to complicated UTI and nonobstructive left nephrolithiasis. She completed her antibiotic course of cefepime yesterday. She was discharged from the hospital yesterday to [facility]. During her hospital stay, she underwent extensive PT/OT due to physical deconditioning. Patient states she slept in a chair last night which she believes worsened his back/RLE pain.  Denies saddle paresthesias, bowel/bladder incontinence, lower extremity numbness/tingling, lower extremity weakness, IV drug use, fever/chills. Patient told the RN that she felt like she was "dumped off to [facility] No recent injury to low back or RLE. She has not tried anything for her symptoms. Pain worse with ambulation. Very difficult to obtain HPI because patient will not answer many of my questions.   Past Psychiatric History: as outlined below  Risk to Self:  yes Risk to Others:  no Prior Inpatient Therapy:  no  Prior Outpatient Therapy:  yes  Past Medical History:  Past Medical History:  Diagnosis Date   Allergy  Amenorrhea    Anemia    post partum    Anxiety    Anxiety    Arthritis    Asthma    Back pain    Constipation    COPD (chronic obstructive pulmonary disease) ([place])    Delivery with history of C-section    Depression    Depression    Diabetes mellitus without complication ([place])    patient denies but states she has hyperglycemia-diet  controlled   Dysmenorrhea    Dysrhythmia    DR Johnsie Cancel     Ectopic pregnancy 2013   Edema, lower extremity    Eosinophilic esophagitis    Diagnosed at [facility] 06/16/2013, untreated   Gallbladder problem    GERD (gastroesophageal reflux disease)    HEARTBURN   TUMS   Hard to intubate 11/07/2015   High cholesterol    IBS (irritable bowel syndrome)    Leukocytosis 07/28/2008   Qualifier: Diagnosis of  By: Jonna Munro MD, Cornelius     Morbid obesity [facility])    Neuromuscular disorder ([place])    RESTLESS LEG    Obesity    Schizoaffective disorder, bipolar type ([place])    Sepsis ([place]) 11/11/2014   Shortness of breath    WITH EXERTION    Sleep apnea    CPAP- in process of restarting     Past Surgical History:  Procedure Laterality Date   BIOPSY  08/13/2018   Procedure: BIOPSY;  Surgeon: Yetta Flock, MD;  Location: [facility];  Service: Gastroenterology;;   CESAREAN SECTION MULTI-GESTATIONAL N/A 02/03/2017   Procedure: [place];  Surgeon: Jonnie Kind, MD;  Location: [place];  Service: Obstetrics;  Laterality: N/A;   CHOLECYSTECTOMY     COLONOSCOPY WITH PROPOFOL N/A 08/13/2018   Procedure: COLONOSCOPY WITH PROPOFOL;  Surgeon: Yetta Flock, MD;  Location: [facility];  Service: Gastroenterology;  Laterality: N/A;   DENTAL SURGERY     ESOPHAGOGASTRODUODENOSCOPY  May 2007   Dr. Gala Romney: Normal esophagus, stomach, D1, D2   ESOPHAGOGASTRODUODENOSCOPY  06/16/2013   Dr. Carlton Adam, eosinophilic esophagitis, reactive gastropathy, no esophageal dilation   ESOPHAGOGASTRODUODENOSCOPY (EGD) WITH PROPOFOL N/A 08/13/2018   Procedure: ESOPHAGOGASTRODUODENOSCOPY (EGD) WITH PROPOFOL;  Surgeon: Yetta Flock, MD;  Location: [facility];  Service: Gastroenterology;  Laterality: N/A;   POLYPECTOMY  08/13/2018   Procedure: POLYPECTOMY;  Surgeon: Yetta Flock, MD;  Location: [facility];  Service: Gastroenterology;;   TONSILLECTOMY     TOOTH EXTRACTION   10/28/2011   Procedure: DENTAL RESTORATION/EXTRACTIONS;  Surgeon: Gae Bon, DDS;  Location: [facility] OR;  Service: Oral Surgery;;   UPPER GASTROINTESTINAL ENDOSCOPY     Family History:  Family History  Problem Relation Age of Onset   Depression Mother    Anxiety disorder Mother    High blood pressure Mother    Bipolar disorder Mother    Eating disorder Mother    Obesity Mother    Hypertension Sister    Allergic rhinitis Sister    Colon polyps Maternal Grandmother        74s   Diabetes Maternal Grandmother    Anxiety disorder Maternal Grandmother    COPD Maternal Grandmother    Crohn's disease Maternal Aunt    Cancer Maternal Grandfather        prostate   HIV/AIDS Father    Eating disorder Father    Obesity Father    Liver disease Neg Hx    Angioedema Neg Hx    Eczema Neg Hx  Immunodeficiency Neg Hx    Asthma Neg Hx    Urticaria Neg Hx    Colon cancer Neg Hx    Esophageal cancer Neg Hx    Rectal cancer Neg Hx    Stomach cancer Neg Hx    Family Psychiatric  History: unknown Social History:  Social History   Substance and Sexual Activity  Alcohol Use No     Social History   Substance and Sexual Activity  Drug Use No    Social History   Socioeconomic History   Marital status: Legally Separated    Spouse name: Gwyndolyn Saxon   Number of children: 0   Years of education: Not on file   Highest education level: Not on file  Occupational History   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 had a pack the other day  Other Topics Concern   Not on file  Social History  Narrative   Not on file   Social Determinants of Health   Financial Resource Strain: Not on file  Food Insecurity: Not on file  Transportation Needs: Not on file  Physical Activity: Not on file  Stress: Not on file  Social Connections: Not on file   Additional Social History:    Allergies:   Allergies  Allergen Reactions   Amoxicillin Anaphylaxis   Bee Venom Shortness Of Breath   Penicillins Anaphylaxis    Tolerates cephalexin and ceftriaxone Has patient had a PCN reaction causing immediate rash, facial/tongue/throat swelling, SOB or lightheadedness with hypotension: No Has patient had a PCN reaction causing severe rash involving mucus membranes or skin necrosis: No Has patient had a PCN reaction that required hospitalization No Has patient had a PCN reaction occurring within the last 10 years: No If all of the above answers are "NO", then may proceed with Cephalosporin use.'  REACTION: Angioedema   Penicillin G    Adhesive [Tape] Rash   Latex Rash   Vancomycin Other (See Comments)    Pt can tolerate Vancomycin but did cause Vancomycin Infusion Reaction.  Recommend to pre-medicate with Benadryl before doses administered.      Labs:  Results for orders placed or performed during the hospital encounter of 01/06/21 (from the past 48 hour(s))  CBC with Differential     Status: Abnormal   Collection Time: 01/06/21 11:05 AM  Result Value Ref Range   WBC 18.3 (H) 4.0 - 10.5 K/uL   RBC 4.62 3.87 - 5.11 MIL/uL   Hemoglobin 11.5 (L) 12.0 - 15.0 g/dL   HCT 39.0 36.0 - 46.0 %   MCV 84.4 80.0 - 100.0 fL   MCH 24.9 (L) 26.0 - 34.0 pg   MCHC 29.5 (L) 30.0 - 36.0 g/dL   RDW 18.1 (H) 11.5 - 15.5 %   Platelets 453 (H) 150 - 400 K/uL   nRBC 0.0 0.0 - 0.2 %   Neutrophils Relative % 78 %   Neutro Abs 14.3 (H) 1.7 - 7.7 K/uL   Lymphocytes Relative  15 %   Lymphs Abs 2.7 0.7 - 4.0 K/uL   Monocytes Relative 5 %   Monocytes Absolute 0.9 0.1 - 1.0 K/uL   Eosinophils Relative 1 %    Eosinophils Absolute 0.2 0.0 - 0.5 K/uL   Basophils Relative 0 %   Basophils Absolute 0.1 0.0 - 0.1 K/uL   Immature Granulocytes 1 %   Abs Immature Granulocytes 0.20 (H) 0.00 - 0.07 K/uL    Comment: Performed at [facility], [place] [address], [place], [place] [address]  Comprehensive metabolic panel     Status: Abnormal   Collection Time: 01/06/21 11:05 AM  Result Value Ref Range   Sodium 139 135 - 145 mmol/L   Potassium 4.2 3.5 - 5.1 mmol/L   Chloride 104 98 - 111 mmol/L   CO2 24 22 - 32 mmol/L   Glucose, Bld 97 70 - 99 mg/dL    Comment: Glucose reference range applies only to samples taken after fasting for at least 8 hours.   BUN 44 (H) 6 - 20 mg/dL   Creatinine, Ser 1.62 (H) 0.44 - 1.00 mg/dL   Calcium 10.1 8.9 - 10.3 mg/dL   Total Protein 9.1 (H) 6.5 - 8.1 g/dL   Albumin 3.9 3.5 - 5.0 g/dL   AST 17 15 - 41 U/L   ALT 19 0 - 44 U/L   Alkaline Phosphatase 66 38 - 126 U/L   Total Bilirubin 0.4 0.3 - 1.2 mg/dL   GFR, Estimated 40 (L) >60 mL/min    Comment: (NOTE) Calculated using the CKD-EPI Creatinine Equation (2021)    Anion gap 11 5 - 15    Comment: Performed at [facility], [place] [address] [place], [place] [address]  Ethanol     Status: None   Collection Time: 01/06/21 11:05 AM  Result Value Ref Range   Alcohol, Ethyl (B) <10 <10 mg/dL    Comment: (NOTE) Lowest detectable limit for serum alcohol is 10 mg/dL.  For medical purposes only. Performed at [facility], [place] [address] [place], [place] [address]   Acetaminophen level     Status: Abnormal   Collection Time: 01/06/21 11:05 AM  Result Value Ref Range   Acetaminophen (Tylenol), Serum <10 (L) 10 - 30 ug/mL    Comment: (NOTE) Therapeutic concentrations vary significantly. A range of 10-30 ug/mL  may be an effective concentration for many patients. However, some  are best treated at concentrations outside of this range. Acetaminophen concentrations >150  ug/mL at 4 hours after ingestion  and >50 ug/mL at 12 hours after ingestion are often associated with  toxic reactions.  Performed at [facility], [place] [address], [place], [place] [address]   Salicylate level     Status: Abnormal   Collection Time: 01/06/21 11:05 AM  Result Value Ref Range   Salicylate Lvl <9.7 (L) 7.0 - 30.0 mg/dL    Comment: Performed at [facility], [place] [address], [place], [place]  [address]  Resp Panel by RT-PCR (Flu A&B, Covid) Nasopharyngeal Swab     Status: None   Collection Time: 01/06/21 11:29 AM   Specimen: Nasopharyngeal Swab; Nasopharyngeal(NP) swabs in vial transport medium  Result Value Ref Range   SARS Coronavirus 2 by RT PCR NEGATIVE NEGATIVE    Comment: (NOTE) SARS-CoV-2 target nucleic acids are NOT DETECTED.  The SARS-CoV-2 RNA is generally detectable in upper respiratory specimens during the acute phase of infection. The lowest concentration of SARS-CoV-2 viral copies  this assay can detect is 138 copies/mL. A negative result does not preclude SARS-Cov-2 infection and should not be used as the sole basis for treatment or other patient management decisions. A negative result may occur with  improper specimen collection/handling, submission of specimen other than nasopharyngeal swab, presence of viral mutation(s) within the areas targeted by this assay, and inadequate number of viral copies(<138 copies/mL). A negative result must be combined with clinical observations, patient history, and epidemiological information. The expected result is Negative.  Fact Sheet for Patients:  EntrepreneurPulse.com.au  Fact Sheet for Healthcare Providers:  IncredibleEmployment.be  This test is no t yet approved or cleared by the [place] FDA and  has been authorized for detection and/or diagnosis of SARS-CoV-2 by FDA under an Emergency Use Authorization (EUA). This EUA will remain   in effect (meaning this test can be used) for the duration of the COVID-19 declaration under Section 564(b)(1) of the Act, 21 U.S.C.section 360bbb-3(b)(1), unless the authorization is terminated  or revoked sooner.       Influenza A by PCR NEGATIVE NEGATIVE   Influenza B by PCR NEGATIVE NEGATIVE    Comment: (NOTE) The Xpert Xpress SARS-CoV-2/FLU/RSV plus assay is intended as an aid in the diagnosis of influenza from Nasopharyngeal swab specimens and should not be used as a sole basis for treatment. Nasal washings and aspirates are unacceptable for Xpert Xpress SARS-CoV-2/FLU/RSV testing.  Fact Sheet for Patients: EntrepreneurPulse.com.au  Fact Sheet for Healthcare Providers: IncredibleEmployment.be  This test is not yet approved or cleared by the [place] FDA and has been authorized for detection and/or diagnosis of SARS-CoV-2 by FDA under an Emergency Use Authorization (EUA). This EUA will remain in effect (meaning this test can be used) for the duration of the COVID-19 declaration under Section 564(b)(1) of the Act, 21 U.S.C. section 360bbb-3(b)(1), unless the authorization is terminated or revoked.  Performed at [facility], [place] [address] [place], [place] [address]   I-Stat beta hCG blood, ED     Status: None   Collection Time: 01/06/21 11:35 AM  Result Value Ref Range   I-stat hCG, quantitative <5.0 <5 mIU/mL   Comment 3            Comment:   GEST. AGE      CONC.  (mIU/mL)   <=1 WEEK        5 - 50     2 WEEKS       50 - 500     3 WEEKS       100 - 10,000     4 WEEKS     1,000 - 30,000        FEMALE AND NON-PREGNANT FEMALE:     LESS THAN 5 mIU/mL   Urinalysis, Routine w reflex microscopic Urine, Clean Catch     Status: Abnormal   Collection Time: 01/06/21  4:47 PM  Result Value Ref Range   Color, Urine YELLOW YELLOW   APPearance CLOUDY (A) CLEAR   Specific Gravity, Urine 1.018 1.005 - 1.030   pH 5.0  5.0 - 8.0   Glucose, UA NEGATIVE NEGATIVE mg/dL   Hgb urine dipstick MODERATE (A) NEGATIVE   Bilirubin Urine NEGATIVE NEGATIVE   Ketones, ur 5 (A) NEGATIVE mg/dL   Protein, ur 100 (A) NEGATIVE mg/dL   Nitrite NEGATIVE NEGATIVE   Leukocytes,Ua LARGE (A) NEGATIVE   RBC / HPF >50 (H) 0 - 5 RBC/hpf   WBC, UA >50 (H) 0 - 5 WBC/hpf  Bacteria, UA MANY (A) NONE SEEN   Squamous Epithelial / LPF 0-5 0 - 5    Comment: Performed at [facility], [place] [address] [place], [place] [address]  Urine rapid drug screen (hosp performed)     Status: None   Collection Time: 01/06/21  4:47 PM  Result Value Ref Range   Opiates NONE DETECTED NONE DETECTED   Cocaine NONE DETECTED NONE DETECTED   Benzodiazepines NONE DETECTED NONE DETECTED   Amphetamines NONE DETECTED NONE DETECTED   Tetrahydrocannabinol NONE DETECTED NONE DETECTED   Barbiturates NONE DETECTED NONE DETECTED    Comment: (NOTE) DRUG SCREEN FOR MEDICAL PURPOSES ONLY.  IF CONFIRMATION IS NEEDED FOR ANY PURPOSE, NOTIFY LAB WITHIN 5 DAYS.  LOWEST DETECTABLE LIMITS FOR URINE DRUG SCREEN Drug Class                     Cutoff (ng/mL) Amphetamine and metabolites    1000 Barbiturate and metabolites    200 Benzodiazepine                 767 Tricyclics and metabolites     300 Opiates and metabolites        300 Cocaine and metabolites        300 THC                            50 Performed at [facility], [place] [address] [place], [place] [address]     Medications:  Current Facility-Administered Medications  Medication Dose Route Frequency Provider Last Rate Last Admin   ascorbic acid (VITAMIN C) tablet 500 mg  500 mg Oral Daily Suzy Bouchard, PA-C   500 mg at 01/07/21 0946   buPROPion (WELLBUTRIN XL) 24 hr tablet 300 mg  300 mg Oral Daily Suzy Bouchard, PA-C   300 mg at 01/07/21 0945   fluticasone (FLONASE) 50 MCG/ACT nasal spray 2 spray  2 spray Each Nare BID Suzy Bouchard, PA-C   2  spray at 01/07/21 0947   furosemide (LASIX) tablet 40 mg  40 mg Oral Daily Suzy Bouchard, PA-C   40 mg at 01/07/21 7902   methocarbamol (ROBAXIN) tablet 500 mg  500 mg Oral QID Charmaine Downs C, PA-C   500 mg at 01/07/21 0946   mometasone-formoterol (DULERA) 100-5 MCG/ACT inhaler 2 puff  2 puff Inhalation BID Charlesetta Shanks, MD   2 puff at 01/07/21 0738   montelukast (SINGULAIR) tablet 10 mg  10 mg Oral QHS Charmaine Downs C, PA-C   10 mg at 01/06/21 2134   potassium chloride SA (KLOR-CON) CR tablet 20 mEq  20 mEq Oral Daily Charmaine Downs C, PA-C   20 mEq at 01/07/21 0946   pregabalin (LYRICA) capsule 100 mg  100 mg Oral TID Suzy Bouchard, PA-C   100 mg at 01/07/21 0946   rOPINIRole (REQUIP) tablet 4 mg  4 mg Oral QHS Charmaine Downs C, PA-C   4 mg at 01/06/21 2134   senna-docusate (Senokot-S) tablet 2 tablet  2 tablet Oral Daily Suzy Bouchard, PA-C   2 tablet at 01/06/21 1649   topiramate (TOPAMAX) tablet 25 mg  25 mg Oral BID Suzy Bouchard, PA-C   25 mg at 01/07/21 4097   vitamin B-12 (CYANOCOBALAMIN) tablet 1,000 mcg  1,000 mcg Oral Daily Charmaine Downs C, PA-C   1,000 mcg at 01/07/21 0945   ziprasidone (GEODON) capsule 20  mg  20 mg Oral Daily Suzy Bouchard, PA-C   20 mg at 01/07/21 6073   Current Outpatient Medications  Medication Sig Dispense Refill   cephALEXin (KEFLEX) 500 MG capsule Take 1 capsule (500 mg total) by mouth 4 (four) times daily for 5 days. 20 capsule 0   methocarbamol (ROBAXIN) 500 MG tablet Take 1 tablet (500 mg total) by mouth 4 (four) times daily. 120 tablet 2   oxyCODONE (OXY IR/ROXICODONE) 5 MG immediate release tablet Take 1 tablet (5 mg total) by mouth every 4 (four) hours as needed for moderate pain. 30 tablet 0   rOPINIRole (REQUIP) 4 MG tablet Take 1 tablet (4 mg total) by mouth at bedtime. 30 tablet 2   traZODone (DESYREL) 50 MG tablet Take 1 tablet (50 mg total) by mouth at bedtime. 30 tablet 2   acetaminophen  (TYLENOL) 325 MG tablet Take 650 mg by mouth every 6 (six) hours as needed for mild pain, fever or headache.     albuterol (VENTOLIN HFA) 108 (90 Base) MCG/ACT inhaler Inhale 2 puffs into the lungs every 4 (four) hours as needed for wheezing or shortness of breath. 8.5 g 1   ascorbic acid (VITAMIN C) 500 MG tablet Take 1 tablet (500 mg total) by mouth daily. 30 tablet 2   buPROPion (WELLBUTRIN XL) 300 MG 24 hr tablet Take 1 tablet (300 mg total) by mouth daily. 30 tablet 3   clonazePAM (KLONOPIN) 0.5 MG tablet Take 1/2 tablet (0.25 mg total) by mouth daily as needed (panic attacks). 30 tablet 0   clotrimazole (LOTRIMIN) 1 % cream Apply topically 2 (two) times daily. 30 g 0   vitamin B-12 (CYANOCOBALAMIN) 1000 MCG tablet Take 1 tablet (1,000 mcg total) by mouth daily. 30 tablet 2   diclofenac Sodium (VOLTAREN) 1 % GEL Apply 4 g topically 4 (four) times daily. 100 g 2   fluticasone (FLONASE) 50 MCG/ACT nasal spray Place 2 sprays into both nostrils 2 (two) times daily. 16 g 2   furosemide (LASIX) 40 MG tablet Take 1 tablet (40 mg total) by mouth daily. 30 tablet 3   mometasone-formoterol (DULERA) 100-5 MCG/ACT AERO Inhale 2 puffs into the lungs 2 (two) times daily. 13 g 2   montelukast (SINGULAIR) 10 MG tablet Take 1 tablet (10 mg total) by mouth at bedtime. 30 tablet 5   nystatin (MYCOSTATIN/NYSTOP) powder Apply topically 3 (three) times daily. 15 g 0   potassium chloride SA (KLOR-CON) 20 MEQ tablet Take 1 tablet (20 mEq total) by mouth daily. 30 tablet 3   pregabalin (LYRICA) 100 MG capsule Take 1 capsule (100 mg total) by mouth 3 (three) times daily. 90 capsule 2   senna-docusate (SENOKOT-S) 8.6-50 MG tablet Take 2 tablets by mouth daily. 60 tablet 0   silver sulfADIAZINE (SILVADENE) 1 % cream Apply topically daily. 85 g 0   topiramate (TOPAMAX) 25 MG tablet Take 1 tablet (25 mg total) by mouth 2 (two) times daily. 60 tablet 2   traMADol (ULTRAM) 50 MG tablet Take 1 tablet (50 mg total) by mouth  every 6 (six) hours as needed for severe pain. 10 tablet 0   ziprasidone (GEODON) 20 MG capsule Take 1 capsule (20 mg total) by mouth daily. 30 capsule 3    Musculoskeletal: limited via telepsych assessment Strength & Muscle Tone:  patient laying in bed at time of assessment .  Per notes, patient ambulates with walker Gait & Station:  did not assess, pt laying in bed at  the time of assessment Patient leans: N/A   Psychiatric Specialty Exam:  Presentation  General Appearance: Fairly Groomed  Eye Contact:Good  Speech:Normal Rate; Clear and Coherent  Speech Volume:Normal  Handedness:Right   Mood and Affect  Mood:Dysphoric; Depressed  Affect:Labile   Thought Process  Thought Processes:Coherent; Goal Directed  Descriptions of Associations:Intact  Orientation:Full (Time, Place and Person)  Thought Content:Illogical  History of Schizophrenia/Schizoaffective disorder:No  Duration of Psychotic Symptoms:No data recorded Hallucinations:Hallucinations: None  Ideas of Reference:None  Suicidal Thoughts:Suicidal Thoughts: Yes, Active SI Active Intent and/or Plan: With Plan; With Access to Means  Homicidal Thoughts:Homicidal Thoughts: No   Sensorium  Memory:Immediate Good; Recent Good; Remote Good  Judgment:Fair  Insight:Fair   Executive Functions  Concentration:Good  Attention Span:Good  [place] of Knowledge:Good  Language:Good   Psychomotor Activity  Psychomotor Activity:Psychomotor Activity: Normal   Assets  Assets:Communication Skills   Sleep  Sleep:Sleep: Fair Number of Hours of Sleep: 6    Physical Exam: Physical Exam Constitutional:      Appearance: She is obese.  HENT:     Head: Normocephalic.  Cardiovascular:     Rate and Rhythm: Normal rate.  Pulmonary:     Effort: Pulmonary effort is normal.  Neurological:     General: No focal deficit present.     Mental Status: She is alert and oriented to person, place, and  time.  Psychiatric:        Mood and Affect: Mood is depressed.   Review of Systems  Constitutional: Negative.   HENT: Negative.    Eyes: Negative.   Respiratory: Negative.    Cardiovascular:  Positive for leg swelling.  Gastrointestinal: Negative.   Musculoskeletal:  Positive for joint pain.  Skin: Negative.   Neurological: Negative.   Endo/Heme/Allergies: Negative.   Psychiatric/Behavioral:  Positive for depression and suicidal ideas. Negative for hallucinations and substance abuse. The patient is nervous/anxious.   Blood pressure 107/62, pulse 80, temperature 98.3 F (36.8 C), temperature source Oral, resp. rate 15, SpO2 96 %. There is no height or weight on file to calculate BMI.  Treatment Plan Summary: Daily contact with patient to assess and evaluate symptoms and progress in treatment and Medication management  Continue meds Geodon 20mg  po daily fo rmood WellbutrinXL 300mg  po daily for mood  Disposition: Recommend psychiatric Inpatient admission when medically cleared.  Patient with many medical comorbidities and suicidal ideations with plan to overdose on pills if discharged today.  I do believe her mental health concerns are triggered by homelessness and would improve if she had a place to stay. However, the patient has a history for prior suicide attempts, has chronic medical issues, no social support and has limited to no protective factors.  She is a high risk for self harm. Thus, I am not comfortable clearing her psychiatrically under these circumstances.  Recommend gero psych placement.    This service was provided via telemedicine using a 2-way, interactive audio and video technology.  Names of all persons participating in this telemedicine service and their role in this encounter. Name: Caitlyne Harbold Role: Patient   Name: Merlyn Lot Role: PMHNP    Mallie Darting, NP 01/07/2021 12:47 PM

## 2021-01-07 NOTE — Progress Notes (Signed)
CSW very familiar with patient as she was recently discharged from [facility] on Friday 01/05/21 from DTP list.   CSW spoke with ED PA on Saturday 10/1 to discuss patient and explained previous placement efforts while patient was at [facility] for two months that were unsuccessful. Patient has no income and there are not any shelter beds available to accommodate the patient due to her inability to get on a top bunk. Patient has been given shelter resources and is aware of lack of availability for emergency housing at this time.  [name], MSW, LCSW Transitions of Care  Clinical Social Worker II [phone]

## 2021-01-07 NOTE — ED Notes (Signed)
Patient had soiled her linens. New sheets changed, new purewick applied. Patient now clean and dry.

## 2021-01-07 NOTE — Progress Notes (Signed)
Per Olena Leatherwood, patient meets criteria for inpatient treatment. There are no available or appropriate beds at [facility] today. CSW faxed referrals to the following facilities for review:  [facility]  Pending - No Request Sent N/A [address], [place] [address] [phone] --  [place]  Pending - No Request Sent N/A [address] [place] [address] [phone] --  [place] No Request Sent N/A [address] Marc [place] [address] [phone] --  [place] [address] [place] [address] [phone] --  [facility]  Pending - No Request Sent N/A [address] [place] [address] [phone] --  [facility]  Pending - No Request Sent N/A [address] [place] [address] [phone] --  [facility]  Pending - No Request Sent N/A [address] [place], [place] [address] [phone] --  [facility]  Pending - No Request Sent N/A [address] [place] [address] [place] --  [facility]  Pending - No Request Sent N/A [address] [place]iane [place] [address] [facility]  Pending - No Request Sent N/A [address] [place] [address] [phone] --  [facility]  Pending - No Request Sent N/A [address] [place] [address] [phone] --  [facility]  Pending - No Request Sent N/A [address] [place] [address] [phone] --  [place]  Pending - No Request Sent N/A [address], [place] [address] [phone] --  [facility]  Pending - No Request Sent N/A [place], [place] [address] [place] --  [place]  Pending - No Request Sent N/A [address] Diamantina Monks [place] [address] [phone] --  [facility]  Pending - No Request Sent N/A [address], [place] [address] [phone] --  [facility]  Pending - No Request Sent N/A [address], [place] [address] [phone] --  [facility]  Pending - No Request Sent N/A [address], [place] [address] [phone] --  [facility]  Pending - No Request Sent N/A [place], [place] [address] [phone] --  [facility]  Pending - No Request Sent N/A [address], [place] [address] [phone] --  [place]  Pending - No Request Sent N/A [address], [place] [address] [phone] --  [facility]  Pending - No Request Sent N/A [address] [place] [address] [phone] --  [facility]  Pending - No Request Sent N/A [address] [place] [address] [phone] --  [place] No Request Sent N/A [place] [place] [address] [phone] --  [place]  Pending - No Request Sent N/A [address] Madelaine Bhat [place] [address] [phone] --  [facility]  Pending - No Request Sent N/A [address] [place] [address] [phone] --  [facility]  Pending - No Request Sent N/A [address], [place] [address] [phone] --   TTS will continue to  seek bed placement.  Glennie Isle, MSW, [place],  LCAS-A Phone: [phone] Disposition/TOC

## 2021-01-07 NOTE — ED Notes (Signed)
Pt had large BM on bed pan.  Pt cleansed and new padding applied.  Pt alert and oriented.

## 2021-01-07 NOTE — ED Notes (Signed)
Patient calling out for pain medication. She says her back and legs hurt. Verbal order from Dr. Randal Buba for 650mg  Tylenol PO.

## 2021-01-08 NOTE — BH Assessment (Signed)
[place] Assessment Progress Note   Per Merlyn Lot, NP, this pt requires psychiatric hospitalization at this time.  Note indicates that pt is to be placed in a facility providing specialty care for geriatric patients, however, at 45 years of age pt does not meet geriatric criteria.  This Probation officer has therefore sought placement at a [facility] facility The following facilities have been contacted to seek placement for this pt, with results as noted:  *[facility] - initially had beds available, but later called back to report that they are now at capacity.  Referral can be re-faxed tomorrow *[facility] - uncertain about bed availability today; referral faxed, decision pending *[facility]  If this voluntary pt is accepted to a facility, please discuss disposition with pt to be sure that she agrees to the plan.  If a facility agrees to accept pt and the plan changes in any way please call the facility to inform them of the change.  Final disposition is pending as of this writing.  Jalene Mullet, [place] Coordinator [phone]

## 2021-01-08 NOTE — ED Notes (Signed)
Patient calm and cooperative this shift.  Patient requested bedpan for bowel movement.  However, staff encouraged patient to ambulate to bathroom with walker and she complied without incident.

## 2021-01-08 NOTE — Progress Notes (Signed)
01/08/2021  1813  Patient up to the bathroom, then patient walked two lapse from her room #29 to the end of the hallway.

## 2021-01-08 NOTE — ED Provider Notes (Signed)
Emergency Medicine Observation Re-evaluation Note  Heather [name] is a 45 y.o. female, seen on rounds today at 6.  Pt initially presented to the ED for complaints of Leg Pain (R Leg pain, burning), Abdominal Pain (L side), and Suicidal Currently, the patient is resting comfortably.  Physical Exam  BP 128/68 (BP Location: Right Arm)   Pulse 75   Temp 98.6 F (37 C) (Oral)   Resp 18   SpO2 98%  Physical Exam General: NAD   ED Course / MDM  EKG:   I have reviewed the labs performed to date as well as medications administered while in observation.  Recent changes in the last 24 hours include no acute events reported by staff.  Plan  Current plan is for placement.  Heather [name] is not under involuntary commitment.     Heather Merino, MD 01/08/21 [phone]

## 2021-01-08 NOTE — Progress Notes (Signed)
Patient walked from her room # 29 to shower room at the end of the hallway. Patient used a shower chair to sit on in the shower, but bathe alone. Patient was able to wash, dry and put on gown and socks independently while NT monitor. Patient then walked an additional two lapses from the shower room to her room #29, then back to bed. No external cath placed. Pt is aware she need to get up and walk every four hours and to the bathroom.

## 2021-01-08 NOTE — ED Notes (Signed)
Patient ambulated around entire unit with walker.  Patient gait steady. No distress noted.

## 2021-01-09 NOTE — ED Notes (Signed)
Breakfast tray given. °

## 2021-01-09 NOTE — ED Provider Notes (Signed)
Emergency Medicine Observation Re-evaluation Note  Heather [name] is a 45 y.o. female, seen on rounds today.  Pt initially presented to the ED for complaints of Leg Pain (R Leg pain, burning), Abdominal Pain (L side), and Suicidal Currently, the patient is calm,resting.  Physical Exam  BP (!) 145/91 (BP Location: Left Wrist)   Pulse 85   Temp 98.6 F (37 C) (Oral)   Resp 18   SpO2 99%  Physical Exam General: No acute distress Cardiac: Normal rate Lungs: No respiratory distress Psych: Calm, cooperative  ED Course / MDM  EKG:   I have reviewed the labs performed to date as well as medications administered while in observation.  Recent changes in the last 24 hours include no acute events.  Patient was reassessed by psychiatry team and has been cleared for discharge to [facility].  Nursing staff will contact safe transferred, as patient will go straight to [facility] from here.  Plan  Current plan is for discharge pt to [facility].  Heather [name] is not under involuntary commitment.     Varney Biles, MD 01/09/21 1153

## 2021-01-09 NOTE — Progress Notes (Signed)
Pt was given shelter resources with prior admissions and is aware of lack of availability for emergency housing at this time.Pt was informed about [facility] and their day center.   Heather [name].Heather [name], MSW, [place]  Transitions of Care Clinical Social Worker I Direct Dial: [phone]  Fax: [phone] [name]

## 2021-01-09 NOTE — BH Assessment (Signed)
[place] Assessment Progress Note   Per Sheran Fava, NP, this voluntary pt does not require psychiatric hospitalization at this time.  Pt is psychiatrically cleared.  Pt is scheduled for an appointment at [facility] on Monday, 01/29/2021 at 14:30.  Discharge instructions advise pt to keep this appointment.  A TOC consult has been ordered to address pt's psychosocial needs.  EDP Varney Biles, MD and pt's nurse, Tilda Franco, have been notified.  Jalene Mullet, [place] Triage Specialist [phone]

## 2021-01-09 NOTE — ED Notes (Signed)
PT working with patient ambulating in hall with walker.

## 2021-01-09 NOTE — Consult Note (Signed)
  Heather [name] is a 45 y.o. female patient admitted with suicidal ideations with plan to overdose on pills.  Psychiatry recommended overnight observation for med mgmt and monitoring. Patient states " I have no where to go. "   Patient is seen and reassessed by this provider, we did briefly talk about resources and options available to her in the community, in which she states she has already tried them Administrator, arts, urban ministry, and [facility]). Per chart review was recently discharged on 01/05/2021 after a (67) day length of stay that was complicated by UTI, pyelonephritis, and difficulty to place. She remains difficult to place at this time.   Per LCSW note on 10/01 "CSW spoke with ED PA on Saturday 10/1 to discuss patient and explained previous placement efforts while patient was at [facility] for two months that were unsuccessful. Patient has no income and there are not any shelter beds available to accommodate the patient due to her inability to get on a top bunk. Patient has been given shelter resources and is aware of lack of availability for emergency housing at this time."   Unfortunately as noted above multiple month long attempts have been made to place this patient from [place], SNF, Rehab and emergency housing. So at this point in time she is predominantly here for housing purposes by her own admission she denies current suicidal thoughts plans or intent, has a history of malingering and is discharged.   -Will place [facility] consult to review any additional places or other opportunities for housing and resources.  - Psych cleared at this time.  -She has an appointment with Dr. Reita Cliche on 10/24, and has been complaint with there medications.

## 2021-01-09 NOTE — Evaluation (Signed)
Physical Therapy Evaluation Patient Details Name: Heather [name] MRN: [id number] DOB: April 23, 1975 Today's Date: 01/09/2021  History of Present Illness  45 y.o. female presented to ED for chest pain and palpitations and feeling like her throat was closing while at her behavioral health facility for SI. Admitted 10/31/20 for treatment of complicated UTI PMH: anxiety and depression (hx of bipolar and schizoaffective disorder in chart in past), ACD, panniculitis, debility,GERD,  OSA not on cpap, chronic back pain.  Clinical Impression  Patient ambulated x 80 with rollator and 71' with 2 wheeled. Recommend continued ambulation with staff.  Has a Rw at mother's home. Pt admitted with above diagnosis.  Pt currently with functional limitations due to the deficits listed below (see PT Problem List). Pt will benefit from skilled PT to increase their independence and safety with mobility to allow discharge to the venue listed below.          Recommendations for follow up therapy are one component of a multi-disciplinary discharge planning process, led by the attending physician.  Recommendations may be updated based on patient status, additional functional criteria and insurance authorization.  Follow Up Recommendations Other (comment);Supervision - Intermittent    Equipment Recommendations  None recommended by PT    Recommendations for Other Services       Precautions / Restrictions Precautions Precautions: Fall Precaution Comments: fell prior to hosp      Mobility  Bed Mobility   Bed Mobility: Supine to Sit;Sit to Supine Rolling: Modified independent (Device/Increase time)              Transfers   Equipment used: Rolling walker (2 wheeled);4-wheeled walker Transfers: Sit to/from Stand Sit to Stand: Min guard         General transfer comment: min guard for power up from bed pt with heavy reliance on L LE due to pain in R knee with  extension  Ambulation/Gait Ambulation/Gait assistance: Min guard Gait Distance (Feet): 80 Feet Assistive device: 4-wheeled walker Gait Pattern/deviations: Trunk flexed;Decreased stance time - right;Decreased step length - left;Decreased dorsiflexion - right;Decreased weight shift to right Gait velocity: slowed   General Gait Details: then 38' with 2 wheeled RW  Stairs            Wheelchair Mobility    Modified Rankin (Stroke Patients Only)       Balance     Sitting balance-Leahy Scale: Good Sitting balance - Comments: demonstrates good sitting balance     Standing balance-Leahy Scale: Fair Standing balance comment: stands during drying self without support                             Pertinent Vitals/Pain Faces Pain Scale: Hurts little more Pain Location: R knee with long distance ambulation Pain Descriptors / Indicators: Grimacing;Aching Pain Intervention(s): Monitored during session    Home Living Family/patient expects to be discharged to:: Unsure                      Prior Function     Gait / Transfers Assistance Needed: reports ambulating short distances with RW with increasing difficult fell on R knee prior to hospitalization           Hand Dominance        Extremity/Trunk Assessment   Upper Extremity Assessment Upper Extremity Assessment: Overall WFL for tasks assessed    Lower Extremity Assessment Lower Extremity Assessment: Generalized weakness  Communication   Communication: No difficulties  Cognition Arousal/Alertness: Awake/alert Behavior During Therapy: Flat affect Overall Cognitive Status: No family/caregiver present to determine baseline cognitive functioning Area of Impairment: Safety/judgement;Problem solving                   Current Attention Level: Alternating       Awareness: Intellectual          General Comments      Exercises     Assessment/Plan    PT Assessment  Patient needs continued PT services  PT Problem List Decreased strength;Decreased range of motion;Decreased activity tolerance;Decreased balance;Obesity;Decreased skin integrity;Decreased mobility;Pain       PT Treatment Interventions DME instruction;Gait training;Functional mobility training;Therapeutic activities;Therapeutic exercise;Balance training;Cognitive remediation;Patient/family education    PT Goals (Current goals can be found in the Care Plan section)  Acute Rehab PT Goals Patient Stated Goal: to walk more PT Goal Formulation: With patient Time For Goal Achievement: 01/23/21 Potential to Achieve Goals: Fair    Frequency Min 2X/week   Barriers to discharge        Co-evaluation               AM-PAC PT "6 Clicks" Mobility  Outcome Measure Help needed turning from your back to your side while in a flat bed without using bedrails?: A Little Help needed moving from lying on your back to sitting on the side of a flat bed without using bedrails?: A Little Help needed moving to and from a bed to a chair (including a wheelchair)?: A Little Help needed standing up from a chair using your arms (e.g., wheelchair or bedside chair)?: A Little Help needed to walk in hospital room?: A Little Help needed climbing 3-5 steps with a railing? : A Lot 6 Click Score: 17    End of Session Equipment Utilized During Treatment: Gait belt Activity Tolerance: Patient tolerated treatment well;Patient limited by pain Patient left: in bed;with call bell/phone within reach Nurse Communication: Mobility status PT Visit Diagnosis: Unsteadiness on feet (R26.81);Muscle weakness (generalized) (M62.81);History of falling (Z91.81) Pain - Right/Left: Right    Time: 5366-4403 PT Time Calculation (min) (ACUTE ONLY): 33 min   Charges:   PT Evaluation $PT Eval Low Complexity: [place] PT Acute Rehabilitation Services Pager [phone] Office [phone]   Claretha Cooper 01/09/2021, 5:19 PM

## 2021-01-25 ENCOUNTER — Other Ambulatory Visit: Payer: Self-pay

## 2021-01-25 ENCOUNTER — Inpatient Hospital Stay (HOSPITAL_COMMUNITY)
Admission: EM | Admit: 2021-01-25 | Discharge: 2021-02-04 | DRG: 690 | Disposition: A | Discharge status: Other Acute Inpatient Hospital | Payer: 59 | Attending: Internal Medicine | Admitting: Internal Medicine

## 2021-01-25 ENCOUNTER — Emergency Department (HOSPITAL_COMMUNITY): Payer: 59

## 2021-01-25 DIAGNOSIS — Z91040 Latex allergy status: Secondary | ICD-10-CM

## 2021-01-25 DIAGNOSIS — G4733 Obstructive sleep apnea (adult) (pediatric): Secondary | ICD-10-CM | POA: Diagnosis present

## 2021-01-25 DIAGNOSIS — I10 Essential (primary) hypertension: Secondary | ICD-10-CM | POA: Diagnosis present

## 2021-01-25 DIAGNOSIS — K219 Gastro-esophageal reflux disease without esophagitis: Secondary | ICD-10-CM | POA: Diagnosis present

## 2021-01-25 DIAGNOSIS — J449 Chronic obstructive pulmonary disease, unspecified: Secondary | ICD-10-CM | POA: Diagnosis present

## 2021-01-25 DIAGNOSIS — N183 Chronic kidney disease, stage 3 unspecified: Secondary | ICD-10-CM | POA: Diagnosis present

## 2021-01-25 DIAGNOSIS — G2581 Restless legs syndrome: Secondary | ICD-10-CM | POA: Diagnosis present

## 2021-01-25 DIAGNOSIS — B952 Enterococcus as the cause of diseases classified elsewhere: Secondary | ICD-10-CM | POA: Diagnosis present

## 2021-01-25 DIAGNOSIS — Z8371 Family history of colonic polyps: Secondary | ICD-10-CM

## 2021-01-25 DIAGNOSIS — E1122 Type 2 diabetes mellitus with diabetic chronic kidney disease: Secondary | ICD-10-CM | POA: Diagnosis present

## 2021-01-25 DIAGNOSIS — F419 Anxiety disorder, unspecified: Secondary | ICD-10-CM | POA: Diagnosis present

## 2021-01-25 DIAGNOSIS — N1831 Chronic kidney disease, stage 3a: Secondary | ICD-10-CM | POA: Diagnosis present

## 2021-01-25 DIAGNOSIS — Z833 Family history of diabetes mellitus: Secondary | ICD-10-CM

## 2021-01-25 DIAGNOSIS — Z8249 Family history of ischemic heart disease and other diseases of the circulatory system: Secondary | ICD-10-CM

## 2021-01-25 DIAGNOSIS — G894 Chronic pain syndrome: Secondary | ICD-10-CM | POA: Diagnosis present

## 2021-01-25 DIAGNOSIS — Z87891 Personal history of nicotine dependence: Secondary | ICD-10-CM

## 2021-01-25 DIAGNOSIS — Z825 Family history of asthma and other chronic lower respiratory diseases: Secondary | ICD-10-CM

## 2021-01-25 DIAGNOSIS — N119 Chronic tubulo-interstitial nephritis, unspecified: Secondary | ICD-10-CM | POA: Diagnosis present

## 2021-01-25 DIAGNOSIS — R531 Weakness: Secondary | ICD-10-CM | POA: Diagnosis present

## 2021-01-25 DIAGNOSIS — Z880 Allergy status to penicillin: Secondary | ICD-10-CM

## 2021-01-25 DIAGNOSIS — I129 Hypertensive chronic kidney disease with stage 1 through stage 4 chronic kidney disease, or unspecified chronic kidney disease: Secondary | ICD-10-CM | POA: Diagnosis present

## 2021-01-25 DIAGNOSIS — F32A Depression, unspecified: Secondary | ICD-10-CM | POA: Diagnosis present

## 2021-01-25 DIAGNOSIS — Z8042 Family history of malignant neoplasm of prostate: Secondary | ICD-10-CM

## 2021-01-25 DIAGNOSIS — E7800 Pure hypercholesterolemia, unspecified: Secondary | ICD-10-CM | POA: Diagnosis present

## 2021-01-25 DIAGNOSIS — J45909 Unspecified asthma, uncomplicated: Secondary | ICD-10-CM | POA: Diagnosis present

## 2021-01-25 DIAGNOSIS — Z6841 Body Mass Index (BMI) 40.0 and over, adult: Secondary | ICD-10-CM

## 2021-01-25 DIAGNOSIS — N12 Tubulo-interstitial nephritis, not specified as acute or chronic: Secondary | ICD-10-CM

## 2021-01-25 DIAGNOSIS — I4510 Unspecified right bundle-branch block: Secondary | ICD-10-CM | POA: Diagnosis present

## 2021-01-25 DIAGNOSIS — Z5901 Sheltered homelessness: Secondary | ICD-10-CM

## 2021-01-25 DIAGNOSIS — Z23 Encounter for immunization: Secondary | ICD-10-CM

## 2021-01-25 DIAGNOSIS — N3090 Cystitis, unspecified without hematuria: Secondary | ICD-10-CM | POA: Diagnosis present

## 2021-01-25 DIAGNOSIS — R112 Nausea with vomiting, unspecified: Secondary | ICD-10-CM | POA: Diagnosis present

## 2021-01-25 DIAGNOSIS — N3091 Cystitis, unspecified with hematuria: Principal | ICD-10-CM | POA: Diagnosis present

## 2021-01-25 DIAGNOSIS — F39 Unspecified mood [affective] disorder: Secondary | ICD-10-CM | POA: Diagnosis present

## 2021-01-25 DIAGNOSIS — E119 Type 2 diabetes mellitus without complications: Secondary | ICD-10-CM

## 2021-01-25 DIAGNOSIS — Z79899 Other long term (current) drug therapy: Secondary | ICD-10-CM

## 2021-01-25 DIAGNOSIS — Z91199 Patient's noncompliance with other medical treatment and regimen due to unspecified reason: Secondary | ICD-10-CM

## 2021-01-25 DIAGNOSIS — Z881 Allergy status to other antibiotic agents status: Secondary | ICD-10-CM

## 2021-01-25 DIAGNOSIS — F250 Schizoaffective disorder, bipolar type: Secondary | ICD-10-CM | POA: Diagnosis present

## 2021-01-25 DIAGNOSIS — Z20822 Contact with and (suspected) exposure to covid-19: Secondary | ICD-10-CM | POA: Diagnosis present

## 2021-01-25 DIAGNOSIS — D638 Anemia in other chronic diseases classified elsewhere: Secondary | ICD-10-CM | POA: Diagnosis present

## 2021-01-25 DIAGNOSIS — R197 Diarrhea, unspecified: Secondary | ICD-10-CM | POA: Diagnosis present

## 2021-01-25 DIAGNOSIS — Z560 Unemployment, unspecified: Secondary | ICD-10-CM

## 2021-01-25 DIAGNOSIS — Z635 Disruption of family by separation and divorce: Secondary | ICD-10-CM

## 2021-01-25 DIAGNOSIS — Z91048 Other nonmedicinal substance allergy status: Secondary | ICD-10-CM

## 2021-01-25 DIAGNOSIS — M1990 Unspecified osteoarthritis, unspecified site: Secondary | ICD-10-CM | POA: Diagnosis present

## 2021-01-25 DIAGNOSIS — Z830 Family history of human immunodeficiency virus [HIV] disease: Secondary | ICD-10-CM

## 2021-01-25 DIAGNOSIS — Z818 Family history of other mental and behavioral disorders: Secondary | ICD-10-CM

## 2021-01-25 DIAGNOSIS — Z659 Problem related to unspecified psychosocial circumstances: Secondary | ICD-10-CM

## 2021-01-25 DIAGNOSIS — E876 Hypokalemia: Secondary | ICD-10-CM | POA: Diagnosis present

## 2021-01-25 LAB — COMPREHENSIVE METABOLIC PANEL
ALT: 22 U/L (ref 0–44)
AST: 27 U/L (ref 15–41)
Albumin: 3.3 g/dL — ABNORMAL LOW (ref 3.5–5.0)
Alkaline Phosphatase: 61 U/L (ref 38–126)
Anion gap: 11 (ref 5–15)
BUN: 15 mg/dL (ref 6–20)
CO2: 22 mmol/L (ref 22–32)
Calcium: 9.4 mg/dL (ref 8.9–10.3)
Chloride: 104 mmol/L (ref 98–111)
Creatinine, Ser: 1.32 mg/dL — ABNORMAL HIGH (ref 0.44–1.00)
GFR, Estimated: 51 mL/min — ABNORMAL LOW (ref >60)
Glucose, Bld: 98 mg/dL (ref 70–99)
Potassium: 3.6 mmol/L (ref 3.5–5.1)
Sodium: 137 mmol/L (ref 135–145)
Total Bilirubin: 0.6 mg/dL (ref 0.3–1.2)
Total Protein: 7 g/dL (ref 6.5–8.1)

## 2021-01-25 LAB — CBC WITH DIFFERENTIAL/PLATELET
Abs Immature Granulocytes: 0.06 10*3/uL (ref 0.00–0.07)
Basophils Absolute: 0 10*3/uL (ref 0.0–0.1)
Basophils Relative: 0 %
Eosinophils Absolute: 0.3 10*3/uL (ref 0.0–0.5)
Eosinophils Relative: 3 %
HCT: 37.9 % (ref 36.0–46.0)
Hemoglobin: 11.4 g/dL — ABNORMAL LOW (ref 12.0–15.0)
Immature Granulocytes: 1 %
Lymphocytes Relative: 24 %
Lymphs Abs: 2.7 10*3/uL (ref 0.7–4.0)
MCH: 25.7 pg — ABNORMAL LOW (ref 26.0–34.0)
MCHC: 30.1 g/dL (ref 30.0–36.0)
MCV: 85.6 fL (ref 80.0–100.0)
Monocytes Absolute: 0.8 10*3/uL (ref 0.1–1.0)
Monocytes Relative: 7 %
Neutro Abs: 7.3 10*3/uL (ref 1.7–7.7)
Neutrophils Relative %: 65 %
Platelets: 362 10*3/uL (ref 150–400)
RBC: 4.43 MIL/uL (ref 3.87–5.11)
RDW: 18.9 % — ABNORMAL HIGH (ref 11.5–15.5)
WBC: 11.1 10*3/uL — ABNORMAL HIGH (ref 4.0–10.5)
nRBC: 0 % (ref 0.0–0.2)

## 2021-01-25 LAB — LACTIC ACID, PLASMA: Lactic Acid, Venous: 1.6 mmol/L (ref 0.5–1.9)

## 2021-01-25 LAB — I-STAT BETA HCG BLOOD, ED (MC, WL, AP ONLY): I-stat hCG, quantitative: <5 m[IU]/mL (ref <5)

## 2021-01-25 LAB — TROPONIN I (HIGH SENSITIVITY): Troponin I (High Sensitivity): 7 ng/L (ref <18)

## 2021-01-25 LAB — LIPASE, BLOOD: Lipase: 34 U/L (ref 11–51)

## 2021-01-25 NOTE — ED Notes (Signed)
Pt denies SI to this RN at this time

## 2021-01-25 NOTE — ED Triage Notes (Signed)
Pt BIB EMS from a hotel. Pt called out for generalized weakness, frequent urination, and diarrhea over the last week.  Pt denies nausea or vomiting.  VS with EMS  182/palp  HR 103 RR 20 O2 99% RA

## 2021-01-26 ENCOUNTER — Encounter (HOSPITAL_COMMUNITY): Payer: Self-pay | Admitting: Internal Medicine

## 2021-01-26 ENCOUNTER — Emergency Department (HOSPITAL_COMMUNITY): Payer: 59

## 2021-01-26 DIAGNOSIS — Z6841 Body Mass Index (BMI) 40.0 and over, adult: Secondary | ICD-10-CM

## 2021-01-26 DIAGNOSIS — G4733 Obstructive sleep apnea (adult) (pediatric): Secondary | ICD-10-CM | POA: Diagnosis present

## 2021-01-26 DIAGNOSIS — Z5902 Unsheltered homelessness: Secondary | ICD-10-CM | POA: Diagnosis not present

## 2021-01-26 DIAGNOSIS — J45901 Unspecified asthma with (acute) exacerbation: Secondary | ICD-10-CM | POA: Diagnosis not present

## 2021-01-26 DIAGNOSIS — R7989 Other specified abnormal findings of blood chemistry: Secondary | ICD-10-CM | POA: Diagnosis not present

## 2021-01-26 DIAGNOSIS — Z7984 Long term (current) use of oral hypoglycemic drugs: Secondary | ICD-10-CM | POA: Diagnosis not present

## 2021-01-26 DIAGNOSIS — E1122 Type 2 diabetes mellitus with diabetic chronic kidney disease: Secondary | ICD-10-CM | POA: Diagnosis present

## 2021-01-26 DIAGNOSIS — Z7951 Long term (current) use of inhaled steroids: Secondary | ICD-10-CM | POA: Diagnosis not present

## 2021-01-26 DIAGNOSIS — Z87891 Personal history of nicotine dependence: Secondary | ICD-10-CM | POA: Diagnosis not present

## 2021-01-26 DIAGNOSIS — F331 Major depressive disorder, recurrent, moderate: Secondary | ICD-10-CM | POA: Diagnosis not present

## 2021-01-26 DIAGNOSIS — N12 Tubulo-interstitial nephritis, not specified as acute or chronic: Secondary | ICD-10-CM | POA: Diagnosis present

## 2021-01-26 DIAGNOSIS — G2581 Restless legs syndrome: Secondary | ICD-10-CM | POA: Diagnosis present

## 2021-01-26 DIAGNOSIS — F32A Depression, unspecified: Secondary | ICD-10-CM | POA: Diagnosis present

## 2021-01-26 DIAGNOSIS — N3091 Cystitis, unspecified with hematuria: Secondary | ICD-10-CM | POA: Diagnosis present

## 2021-01-26 DIAGNOSIS — F39 Unspecified mood [affective] disorder: Secondary | ICD-10-CM | POA: Diagnosis present

## 2021-01-26 DIAGNOSIS — B952 Enterococcus as the cause of diseases classified elsewhere: Secondary | ICD-10-CM

## 2021-01-26 DIAGNOSIS — J4530 Mild persistent asthma, uncomplicated: Secondary | ICD-10-CM | POA: Diagnosis not present

## 2021-01-26 DIAGNOSIS — E7800 Pure hypercholesterolemia, unspecified: Secondary | ICD-10-CM | POA: Diagnosis present

## 2021-01-26 DIAGNOSIS — Z880 Allergy status to penicillin: Secondary | ICD-10-CM | POA: Diagnosis not present

## 2021-01-26 DIAGNOSIS — E876 Hypokalemia: Secondary | ICD-10-CM | POA: Diagnosis present

## 2021-01-26 DIAGNOSIS — N183 Chronic kidney disease, stage 3 unspecified: Secondary | ICD-10-CM | POA: Diagnosis not present

## 2021-01-26 DIAGNOSIS — Z79899 Other long term (current) drug therapy: Secondary | ICD-10-CM | POA: Diagnosis not present

## 2021-01-26 DIAGNOSIS — Z85038 Personal history of other malignant neoplasm of large intestine: Secondary | ICD-10-CM | POA: Diagnosis not present

## 2021-01-26 DIAGNOSIS — R262 Difficulty in walking, not elsewhere classified: Secondary | ICD-10-CM | POA: Diagnosis not present

## 2021-01-26 DIAGNOSIS — F4310 Post-traumatic stress disorder, unspecified: Secondary | ICD-10-CM | POA: Diagnosis not present

## 2021-01-26 DIAGNOSIS — N1831 Chronic kidney disease, stage 3a: Secondary | ICD-10-CM | POA: Diagnosis present

## 2021-01-26 DIAGNOSIS — G894 Chronic pain syndrome: Secondary | ICD-10-CM | POA: Diagnosis present

## 2021-01-26 DIAGNOSIS — N3090 Cystitis, unspecified without hematuria: Secondary | ICD-10-CM

## 2021-01-26 DIAGNOSIS — I129 Hypertensive chronic kidney disease with stage 1 through stage 4 chronic kidney disease, or unspecified chronic kidney disease: Secondary | ICD-10-CM | POA: Diagnosis present

## 2021-01-26 DIAGNOSIS — D72829 Elevated white blood cell count, unspecified: Secondary | ICD-10-CM | POA: Diagnosis not present

## 2021-01-26 DIAGNOSIS — R197 Diarrhea, unspecified: Secondary | ICD-10-CM | POA: Diagnosis present

## 2021-01-26 DIAGNOSIS — R110 Nausea: Secondary | ICD-10-CM | POA: Diagnosis not present

## 2021-01-26 DIAGNOSIS — Z659 Problem related to unspecified psychosocial circumstances: Secondary | ICD-10-CM | POA: Diagnosis not present

## 2021-01-26 DIAGNOSIS — F250 Schizoaffective disorder, bipolar type: Secondary | ICD-10-CM | POA: Diagnosis present

## 2021-01-26 DIAGNOSIS — Z881 Allergy status to other antibiotic agents status: Secondary | ICD-10-CM | POA: Diagnosis not present

## 2021-01-26 DIAGNOSIS — E669 Obesity, unspecified: Secondary | ICD-10-CM | POA: Diagnosis not present

## 2021-01-26 DIAGNOSIS — N119 Chronic tubulo-interstitial nephritis, unspecified: Secondary | ICD-10-CM | POA: Diagnosis present

## 2021-01-26 DIAGNOSIS — Z91048 Other nonmedicinal substance allergy status: Secondary | ICD-10-CM | POA: Diagnosis not present

## 2021-01-26 DIAGNOSIS — Z5900 Homelessness unspecified: Secondary | ICD-10-CM | POA: Diagnosis not present

## 2021-01-26 DIAGNOSIS — J45909 Unspecified asthma, uncomplicated: Secondary | ICD-10-CM | POA: Diagnosis not present

## 2021-01-26 DIAGNOSIS — Z20822 Contact with and (suspected) exposure to covid-19: Secondary | ICD-10-CM | POA: Diagnosis present

## 2021-01-26 DIAGNOSIS — D638 Anemia in other chronic diseases classified elsewhere: Secondary | ICD-10-CM | POA: Diagnosis present

## 2021-01-26 DIAGNOSIS — Z23 Encounter for immunization: Secondary | ICD-10-CM | POA: Diagnosis not present

## 2021-01-26 DIAGNOSIS — J449 Chronic obstructive pulmonary disease, unspecified: Secondary | ICD-10-CM | POA: Diagnosis present

## 2021-01-26 DIAGNOSIS — Z91040 Latex allergy status: Secondary | ICD-10-CM | POA: Diagnosis not present

## 2021-01-26 LAB — URINALYSIS, ROUTINE W REFLEX MICROSCOPIC
Bilirubin Urine: NEGATIVE
Glucose, UA: NEGATIVE mg/dL
Ketones, ur: 20 mg/dL — AB
Nitrite: POSITIVE — AB
Protein, ur: 100 mg/dL — AB
Specific Gravity, Urine: 1.014 (ref 1.005–1.030)
WBC, UA: >50 WBC/hpf — ABNORMAL HIGH (ref 0–5)
pH: 6 (ref 5.0–8.0)

## 2021-01-26 LAB — URINE CULTURE: Culture: >=100000 — AB

## 2021-01-26 LAB — RESP PANEL BY RT-PCR (FLU A&B, COVID) ARPGX2
Influenza A by PCR: NEGATIVE
Influenza B by PCR: NEGATIVE
SARS Coronavirus 2 by RT PCR: NEGATIVE

## 2021-01-26 LAB — PROTIME-INR
INR: 1.2 (ref 0.8–1.2)
Prothrombin Time: 14.7 seconds (ref 11.4–15.2)

## 2021-01-26 LAB — APTT: aPTT: 28 seconds (ref 24–36)

## 2021-01-26 MED ORDER — ROPINIROLE HCL 1 MG PO TABS
4.0000 mg | ORAL_TABLET | Freq: Every day | ORAL | Status: DC
  Administered 2021-01-26 – 2021-02-03 (×9): 4 mg via ORAL
  Filled 2021-01-26 (×10): qty 4

## 2021-01-26 MED ORDER — INFLUENZA VAC SPLIT QUAD 0.5 ML IM SUSY
0.5000 mL | PREFILLED_SYRINGE | INTRAMUSCULAR | Status: AC
  Administered 2021-01-27: 0.5 mL via INTRAMUSCULAR
  Filled 2021-01-26: qty 0.5

## 2021-01-26 MED ORDER — ALBUTEROL SULFATE (2.5 MG/3ML) 0.083% IN NEBU: 2.5000 mg | INHALATION_SOLUTION | RESPIRATORY_TRACT | Status: DC | PRN

## 2021-01-26 MED ORDER — PREGABALIN 100 MG PO CAPS
100.0000 mg | ORAL_CAPSULE | Freq: Three times a day (TID) | ORAL | Status: DC
  Administered 2021-01-26 – 2021-02-04 (×28): 100 mg via ORAL
  Filled 2021-01-26 (×28): qty 1

## 2021-01-26 MED ORDER — ONDANSETRON HCL 4 MG PO TABS: 4.0000 mg | ORAL_TABLET | Freq: Four times a day (QID) | ORAL | Status: DC | PRN

## 2021-01-26 MED ORDER — FLUTICASONE PROPIONATE 50 MCG/ACT NA SUSP
2.0000 | Freq: Two times a day (BID) | NASAL | Status: DC
  Administered 2021-01-26 – 2021-02-04 (×19): 2 via NASAL
  Filled 2021-01-26: qty 16

## 2021-01-26 MED ORDER — TOPIRAMATE 25 MG PO TABS
25.0000 mg | ORAL_TABLET | Freq: Two times a day (BID) | ORAL | Status: DC
  Administered 2021-01-26 – 2021-02-04 (×19): 25 mg via ORAL
  Filled 2021-01-26 (×19): qty 1

## 2021-01-26 MED ORDER — ZIPRASIDONE HCL 20 MG PO CAPS
20.0000 mg | ORAL_CAPSULE | Freq: Every day | ORAL | Status: DC
  Administered 2021-01-26 – 2021-02-04 (×10): 20 mg via ORAL
  Filled 2021-01-26 (×10): qty 1

## 2021-01-26 MED ORDER — DIPHENHYDRAMINE HCL 50 MG/ML IJ SOLN
25.0000 mg | INTRAMUSCULAR | Status: DC | PRN
  Administered 2021-01-26: 25 mg via INTRAVENOUS
  Filled 2021-01-26: qty 1

## 2021-01-26 MED ORDER — HYDRALAZINE HCL 20 MG/ML IJ SOLN: 5.0000 mg | INTRAMUSCULAR | Status: DC | PRN

## 2021-01-26 MED ORDER — BUPROPION HCL ER (XL) 150 MG PO TB24
300.0000 mg | ORAL_TABLET | Freq: Every day | ORAL | Status: DC
  Administered 2021-01-26 – 2021-02-04 (×10): 300 mg via ORAL
  Filled 2021-01-26: qty 2
  Filled 2021-01-26: qty 1
  Filled 2021-01-26 (×8): qty 2

## 2021-01-26 MED ORDER — SODIUM CHLORIDE 0.9 % IV SOLN
1.0000 g | Freq: Once | INTRAVENOUS | Status: AC
  Administered 2021-01-26: 1 g via INTRAVENOUS
  Filled 2021-01-26: qty 10

## 2021-01-26 MED ORDER — ACETAMINOPHEN 325 MG PO TABS
650.0000 mg | ORAL_TABLET | Freq: Four times a day (QID) | ORAL | Status: DC | PRN
  Administered 2021-02-01 – 2021-02-03 (×2): 650 mg via ORAL
  Filled 2021-01-26 (×2): qty 2

## 2021-01-26 MED ORDER — ONDANSETRON HCL 4 MG/2ML IJ SOLN
4.0000 mg | Freq: Four times a day (QID) | INTRAMUSCULAR | Status: DC | PRN
  Administered 2021-01-28: 4 mg via INTRAVENOUS
  Filled 2021-01-26: qty 2

## 2021-01-26 MED ORDER — VANCOMYCIN HCL 10 G IV SOLR
2500.0000 mg | Freq: Once | INTRAVENOUS | Status: AC
  Administered 2021-01-26: 2500 mg via INTRAVENOUS
  Filled 2021-01-26: qty 2500

## 2021-01-26 MED ORDER — TRAZODONE HCL 50 MG PO TABS
50.0000 mg | ORAL_TABLET | Freq: Every day | ORAL | Status: DC
  Administered 2021-01-26 – 2021-02-03 (×9): 50 mg via ORAL
  Filled 2021-01-26 (×9): qty 1

## 2021-01-26 MED ORDER — BISACODYL 5 MG PO TBEC: 5.0000 mg | DELAYED_RELEASE_TABLET | Freq: Every day | ORAL | Status: DC | PRN

## 2021-01-26 MED ORDER — MONTELUKAST SODIUM 10 MG PO TABS
10.0000 mg | ORAL_TABLET | Freq: Every day | ORAL | Status: DC
  Administered 2021-01-26 – 2021-02-03 (×9): 10 mg via ORAL
  Filled 2021-01-26 (×10): qty 1

## 2021-01-26 MED ORDER — DOCUSATE SODIUM 100 MG PO CAPS
100.0000 mg | ORAL_CAPSULE | Freq: Two times a day (BID) | ORAL | Status: DC
  Administered 2021-01-26 – 2021-02-03 (×16): 100 mg via ORAL
  Filled 2021-01-26 (×19): qty 1

## 2021-01-26 MED ORDER — ALBUTEROL SULFATE (2.5 MG/3ML) 0.083% IN NEBU
3.0000 mL | INHALATION_SOLUTION | RESPIRATORY_TRACT | Status: DC | PRN
  Administered 2021-01-27 – 2021-02-04 (×2): 3 mL via RESPIRATORY_TRACT
  Filled 2021-01-26 (×2): qty 3

## 2021-01-26 MED ORDER — LACTATED RINGERS IV SOLN: INTRAVENOUS | Status: DC

## 2021-01-26 MED ORDER — ACETAMINOPHEN 325 MG PO TABS
650.0000 mg | ORAL_TABLET | Freq: Once | ORAL | Status: AC
  Administered 2021-01-26: 650 mg via ORAL
  Filled 2021-01-26: qty 2

## 2021-01-26 MED ORDER — ENOXAPARIN SODIUM 100 MG/ML IJ SOSY
0.5000 mg/kg | PREFILLED_SYRINGE | INTRAMUSCULAR | Status: DC
  Administered 2021-01-26 – 2021-02-03 (×9): 100 mg via SUBCUTANEOUS
  Filled 2021-01-26 (×10): qty 1

## 2021-01-26 MED ORDER — LEVOFLOXACIN 500 MG PO TABS
500.0000 mg | ORAL_TABLET | ORAL | Status: DC
  Administered 2021-01-26 – 2021-01-27 (×2): 500 mg via ORAL
  Filled 2021-01-26 (×3): qty 1

## 2021-01-26 MED ORDER — ACETAMINOPHEN 650 MG RE SUPP: 650.0000 mg | Freq: Four times a day (QID) | RECTAL | Status: DC | PRN

## 2021-01-26 MED ORDER — METHOCARBAMOL 500 MG PO TABS
500.0000 mg | ORAL_TABLET | Freq: Four times a day (QID) | ORAL | Status: DC
  Administered 2021-01-26 – 2021-02-04 (×35): 500 mg via ORAL
  Filled 2021-01-26 (×35): qty 1

## 2021-01-26 MED ORDER — CLONAZEPAM 0.25 MG PO TBDP: 0.2500 mg | ORAL_TABLET | Freq: Every day | ORAL | Status: DC | PRN

## 2021-01-26 MED ORDER — POLYETHYLENE GLYCOL 3350 17 G PO PACK
17.0000 g | PACK | Freq: Every day | ORAL | Status: DC | PRN
  Filled 2021-01-26: qty 1

## 2021-01-26 MED ORDER — OXYCODONE HCL 5 MG PO TABS: 5.0000 mg | ORAL_TABLET | ORAL | Status: DC | PRN

## 2021-01-26 MED ORDER — SENNOSIDES-DOCUSATE SODIUM 8.6-50 MG PO TABS
2.0000 | ORAL_TABLET | Freq: Every day | ORAL | Status: DC
  Administered 2021-01-26 – 2021-02-03 (×9): 2 via ORAL
  Filled 2021-01-26 (×10): qty 2

## 2021-01-26 MED ORDER — MOMETASONE FURO-FORMOTEROL FUM 100-5 MCG/ACT IN AERO
2.0000 | INHALATION_SPRAY | Freq: Two times a day (BID) | RESPIRATORY_TRACT | Status: DC
  Administered 2021-01-27 – 2021-02-04 (×15): 2 via RESPIRATORY_TRACT
  Filled 2021-01-26: qty 8.8

## 2021-01-26 NOTE — Plan of Care (Signed)
  Problem: Education: Goal: Knowledge of General Education information will improve Description: Including pain rating scale, medication(s)/side effects and non-pharmacologic comfort measures Outcome: Progressing   Problem: Clinical Measurements: Goal: Will remain free from infection Outcome: Progressing   Problem: Coping: Goal: Level of anxiety will decrease Outcome: Progressing   Problem: Safety: Goal: Ability to remain free from injury will improve Outcome: Progressing   Problem: Skin Integrity: Goal: Risk for impaired skin integrity will decrease Outcome: Progressing

## 2021-01-26 NOTE — ED Notes (Signed)
Pt to CT

## 2021-01-26 NOTE — Assessment & Plan Note (Signed)
-  Stable stage 3a CKD -Will recheck BMP in AM to ensure ongoing stability

## 2021-01-26 NOTE — Assessment & Plan Note (Signed)
-  Does not appear to be on CPAP -May need to consider once disposition is clear

## 2021-01-26 NOTE — Progress Notes (Signed)
New Admission Note:   Arrival Method: Stretcher  Mental Orientation: Alert& oriented x 4  Telemetry: no  Assessment: Completed Skin: dry and intact with old abrasion scar left side of hip  IV:  IV vanco infusing left FA Pain: denies any pain Tubes: none  Safety Measures: Safety Fall Prevention Plan has been given, discussed and signed Admission: Completed [facility] [place] Orientation: Patient has been orientated to the room, unit and staff.  Family: unavailable  Orders have been reviewed and implemented. Will continue to monitor the patient. Call light has been placed within reach and bed alarm has been activated.   Dolores Hoose, RN

## 2021-01-26 NOTE — Assessment & Plan Note (Signed)
-  Continue home meds - Wellbutrin, Klonopin, Trazodone, Geodon

## 2021-01-26 NOTE — Assessment & Plan Note (Signed)
-  Continue home meds - Flonase, Dulera, Singulair, Albuterol prn

## 2021-01-26 NOTE — Assessment & Plan Note (Signed)
Continue Requip. 

## 2021-01-26 NOTE — ED Notes (Signed)
Per PA lactics can be discontinued

## 2021-01-26 NOTE — Assessment & Plan Note (Signed)
-  Patient reports that she is homeless, has been living in a motel but needs placement in SNF -She has a Medicaid letter indicating need for psychiatric evaluation in conjunction with PASSR number -TOC team consult requested

## 2021-01-26 NOTE — Progress Notes (Signed)
Pharmacy Antibiotic Note  Heather [name] is a 45 y.o. female admitted on 01/25/2021 with E. Faecium UTI.  Pharmacy has been consulted for levaquin dosing (confirmed w/ micro - organism susceptible)  Plan: Levaquin 500mg  PO daily -Monitor renal function, clinical status, and antibiotic plan  Height: 5\' 3"  (160 cm) Weight: (!) 197.8 kg (436 lb 1.1 oz) IBW/kg (Calculated) : 52.4  Temp (24hrs), Avg:98.2 F (36.8 C), Min:97.9 F (36.6 C), Max:98.7 F (37.1 C)  Recent Labs  Lab 01/25/21 2143 01/25/21 2307  WBC 11.1*  --   CREATININE 1.32*  --   LATICACIDVEN  --  1.6    Estimated Creatinine Clearance: 95.9 mL/min (A) (by C-G formula based on SCr of 1.32 mg/dL (H)).    Allergies  Allergen Reactions   Amoxicillin Anaphylaxis   Bee Venom Shortness Of Breath   Penicillins Anaphylaxis    Tolerates cephalexin and ceftriaxone Has patient had a PCN reaction causing immediate rash, facial/tongue/throat swelling, SOB or lightheadedness with hypotension: No Has patient had a PCN reaction causing severe rash involving mucus membranes or skin necrosis: No Has patient had a PCN reaction that required hospitalization No Has patient had a PCN reaction occurring within the last 10 years: No If all of the above answers are "NO", then may proceed with Cephalosporin use.'  REACTION: Angioedema   Penicillin G    Adhesive [Tape] Rash   Latex Rash   Vancomycin Other (See Comments)    Pt can tolerate Vancomycin but did cause Vancomycin Infusion Reaction.  Recommend to pre-medicate with Benadryl before doses administered.     Antimicrobials this admission: Vanc x1 on 10/21 Levaquin 10/21 >>  Dose adjustments this admission: N/A  Microbiology results: 10/20 BCx:  10/20 UCx:   Thank you for allowing pharmacy to be a part of this patient's care.  Joetta Manners, PharmD, [facility] Emergency Medicine Clinical Pharmacist ED RPh Phone: [place]: [phone]

## 2021-01-26 NOTE — Assessment & Plan Note (Signed)
-  I have reviewed this patient in the [place] Controlled Substances Reporting System.  She has not been routinely receiving prescriptions for controlled substances and so is not at particularly high risk of opioid misuse, diversion, or overdose. -Continue home meds - Robaxin, Lyrica, Topamax

## 2021-01-26 NOTE — Assessment & Plan Note (Addendum)
-  Patient with h/o Enterococcus faecalis UTI on culture from 10/1, sensitive only to Vanc -She was subsequently admitted at [facility] on 10/8 and treated for 5 days with Cefepime; she was diagnosed with sepsis and cultures were negative (urine culture contaminated) -She returns with ongoing symptoms - which is reasonable since 10/1 UTI has not apparently been treated -Will admit for Vanc therapy; she is likely to need to complete her entire treatment course while hospitalized given 10/1 sensitivities -She has h/o red man syndrome and reports need for benadryl with infusion (although generally red man just requires a slower infusion rate) -She also reports h/o "concern for renal cancer" but imaging indicates that this is likely a stable complex cyst -She also has h/o staghorn calculi and is planned for outpatient urology f/u - which appears to be reasonable -Will notify Dr. Abner Greenspan of admission -No current concern for sepsis or pyelonephritis

## 2021-01-26 NOTE — ED Provider Notes (Signed)
[facility] EMERGENCY DEPARTMENT Provider Note   CSN: [id number] Arrival date & time: 01/25/21  2057     History Chief Complaint  Patient presents with   Weakness   Urinary Frequency   Diarrhea    Heather [name] is a 45 y.o. female who presents via EMS for generalized weakness, urinary frequency, and diarrhea x1 week without nausea or vomiting.  Of note patient was recently admitted to [facility] for urosepsis secondary to recurrent urinary tract infections.  She states she was on discharge from that admission with any new medications or antibiotics.  Patient is homeless and has been living in a hotel, not currently received paperwork to be placed in a SNF.  I personally reviewed this patient's medical records. Patient was also admitted at [facility] for 67 days following complicated UTI.  Patient with history of hypertension, super morbid obesity, eosinophilic esophagitis.  Additionally patient does have 7 cm right renal mass concerning for possible renal cell carcinoma.  This was identified admission on 01/14/2021 Outpatient MRI was recommended.  HPI     Past Medical History:  Diagnosis Date   Allergy    Amenorrhea    Anemia    post partum    Anxiety    Anxiety    Arthritis    Asthma    Back pain    Constipation    COPD (chronic obstructive pulmonary disease) ([place])    Delivery with history of C-section    Depression    Depression    Diabetes mellitus without complication ([place])    patient denies but states she has hyperglycemia-diet controlled   Dysmenorrhea    Dysrhythmia    DR Johnsie Cancel     Ectopic pregnancy 2013   Edema, lower extremity    Eosinophilic esophagitis    Diagnosed at [facility] 06/16/2013, untreated   Gallbladder problem    GERD (gastroesophageal reflux disease)    HEARTBURN   TUMS   Hard to intubate 11/07/2015   High cholesterol    IBS (irritable bowel syndrome)    Leukocytosis 07/28/2008   Qualifier:  Diagnosis of  By: Jonna Munro MD, Cornelius     Morbid obesity [facility])    Neuromuscular disorder ([place])    RESTLESS LEG    Obesity    Schizoaffective disorder, bipolar type ([place])    Sepsis ([place]) 11/11/2014   Shortness of breath    WITH EXERTION    Sleep apnea    CPAP- in process of restarting     Patient Active Problem List   Diagnosis Date Noted   Thrombocytosis 10/31/2020   CKD (chronic kidney disease), stage III ([place]) 10/31/2020   Major depressive disorder, recurrent ([place]) 09/13/2020   Insomnia    Anemia of chronic disease    Chronic bilateral low back pain without sciatica    Debility 08/24/2020   SIRS (systemic inflammatory response syndrome) ([place]) 08/18/2020   Pressure injury of skin 08/11/2020   Rhabdomyolysis 08/10/2020   Acute renal failure ([place]) 08/10/2020   PTSD (post-traumatic stress disorder) 05/15/2020   Major depressive disorder, recurrent episode, moderate ([place]) 05/05/2020   Generalized anxiety disorder 05/05/2020   Blood in stool    Gastritis and gastroduodenitis    Benign neoplasm of sigmoid colon    Class 3 severe obesity with serious comorbidity and body mass index (BMI) greater than or equal to 70 in adult [facility]) 06/16/2018   Nexplanon insertion 03/27/2017   Postpartum hypertension 03/05/2017   Perennial allergic rhinitis 11/05/2016  Mild persistent asthma with acute exacerbation 11/05/2016   Excess weight gain in pregnancy, second trimester 10/30/2016   Hypoglycemia 11/07/2015   OSA (obstructive sleep apnea) 11/07/2015   DM type 2 (diabetes mellitus, type 2) ([place]) 12/07/2014   Panniculitis 12/06/2014   Cellulitis, abdominal wall 11/11/2014   Abdominal pain 12/75/1700   Eosinophilic esophagitis 17/49/4496   Change in bowel habits 04/28/2013   RLS (restless legs syndrome) 08/08/2011   Adjustment disorder 75/91/6384   DYSMETABOLIC SYNDROME 66/59/9357   Morbid obesity ([place]) 05/13/2006   Essential hypertension 05/12/2006   Asthma 05/12/2006    OSTEOARTHRITIS 05/12/2006    Past Surgical History:  Procedure Laterality Date   BIOPSY  08/13/2018   Procedure: BIOPSY;  Surgeon: Yetta Flock, MD;  Location: [facility];  Service: Gastroenterology;;   CESAREAN SECTION MULTI-GESTATIONAL N/A 02/03/2017   Procedure: CESAREAN SECTION MULTI-GESTATIONAL;  Surgeon: Jonnie Kind, MD;  Location: [facility];  Service: Obstetrics;  Laterality: N/A;   CHOLECYSTECTOMY     COLONOSCOPY WITH PROPOFOL N/A 08/13/2018   Procedure: COLONOSCOPY WITH PROPOFOL;  Surgeon: Yetta Flock, MD;  Location: [facility];  Service: Gastroenterology;  Laterality: N/A;   DENTAL SURGERY     ESOPHAGOGASTRODUODENOSCOPY  May 2007   Dr. Gala Romney: Normal esophagus, stomach, D1, D2   ESOPHAGOGASTRODUODENOSCOPY  06/16/2013   Dr. Carlton Adam, eosinophilic esophagitis, reactive gastropathy, no esophageal dilation   ESOPHAGOGASTRODUODENOSCOPY (EGD) WITH PROPOFOL N/A 08/13/2018   Procedure: ESOPHAGOGASTRODUODENOSCOPY (EGD) WITH PROPOFOL;  Surgeon: Yetta Flock, MD;  Location: [facility];  Service: Gastroenterology;  Laterality: N/A;   POLYPECTOMY  08/13/2018   Procedure: POLYPECTOMY;  Surgeon: Yetta Flock, MD;  Location: [facility];  Service: Gastroenterology;;   TONSILLECTOMY     TOOTH EXTRACTION  10/28/2011   Procedure: DENTAL RESTORATION/EXTRACTIONS;  Surgeon: Gae Bon, DDS;  Location: [facility] OR;  Service: Oral Surgery;;   UPPER GASTROINTESTINAL ENDOSCOPY       OB History     Gravida  2   Para  1   Term  1   Preterm      AB  1   Living  2      SAB  0   IAB      Ectopic  1   Multiple  1   Live Births  2           Family History  Problem Relation Age of Onset   Depression Mother    Anxiety disorder Mother    High blood pressure Mother    Bipolar disorder Mother    Eating disorder Mother    Obesity Mother    Hypertension Sister    Allergic rhinitis Sister    Colon polyps Maternal Grandmother        46s    Diabetes Maternal Grandmother    Anxiety disorder Maternal Grandmother    COPD Maternal Grandmother    Crohn's disease Maternal Aunt    Cancer Maternal Grandfather        prostate   HIV/AIDS Father    Eating disorder Father    Obesity Father    Liver disease Neg Hx    Angioedema Neg Hx    Eczema Neg Hx    Immunodeficiency Neg Hx    Asthma Neg Hx    Urticaria Neg Hx    Colon cancer Neg Hx    Esophageal cancer Neg Hx    Rectal cancer Neg Hx    Stomach cancer Neg Hx     Social History  Tobacco Use   Smoking status: Former    Packs/day: 0.50    Years: 8.00    Pack years: 4.00    Types: Cigarettes    Quit date: 04/25/2011    Years since quitting: 9.7   Smokeless tobacco: Never  Vaping Use   Vaping Use: Never used  Substance Use Topics   Alcohol use: No   Drug use: No    Home Medications Prior to Admission medications   Medication Sig Start Date End Date Taking? Authorizing Provider  acetaminophen (TYLENOL) 325 MG tablet Take 650 mg by mouth every 6 (six) hours as needed for mild pain, fever or headache.    [provider]  albuterol (VENTOLIN HFA) 108 (90 Base) MCG/ACT inhaler Inhale 2 puffs into the lungs every 4 (four) hours as needed for wheezing or shortness of breath. 12/27/20   Samella Parr, NP  ascorbic acid (VITAMIN C) 500 MG tablet Take 1 tablet (500 mg total) by mouth daily. 12/27/20   Samella Parr, NP  buPROPion (WELLBUTRIN XL) 300 MG 24 hr tablet Take 1 tablet (300 mg total) by mouth daily. 01/04/21   Samella Parr, NP  clonazePAM (KLONOPIN) 0.5 MG tablet Take 1/2 tablet (0.25 mg total) by mouth daily as needed (panic attacks). 01/04/21   Samella Parr, NP  clotrimazole (LOTRIMIN) 1 % cream Apply topically 2 (two) times daily. 11/02/20   Shelly Coss, MD  vitamin B-12 (CYANOCOBALAMIN) 1000 MCG tablet Take 1 tablet (1,000 mcg total) by mouth daily. 12/27/20   Samella Parr, NP  diclofenac Sodium (VOLTAREN) 1 % GEL Apply 4 g topically 4  (four) times daily. 12/27/20   Samella Parr, NP  fluticasone (FLONASE) 50 MCG/ACT nasal spray Place 2 sprays into both nostrils 2 (two) times daily. 12/27/20   Samella Parr, NP  furosemide (LASIX) 40 MG tablet Take 1 tablet (40 mg total) by mouth daily. 01/04/21   Samella Parr, NP  methocarbamol (ROBAXIN) 500 MG tablet Take 1 tablet (500 mg total) by mouth 4 (four) times daily. 12/27/20   Samella Parr, NP  mometasone-formoterol (DULERA) 100-5 MCG/ACT AERO Inhale 2 puffs into the lungs 2 (two) times daily. 12/27/20   Samella Parr, NP  montelukast (SINGULAIR) 10 MG tablet Take 1 tablet (10 mg total) by mouth at bedtime. 08/18/20   Dwyane Dee, MD  nystatin (MYCOSTATIN/NYSTOP) powder Apply topically 3 (three) times daily. 12/27/20   Samella Parr, NP  oxyCODONE (OXY IR/ROXICODONE) 5 MG immediate release tablet Take 1 tablet (5 mg total) by mouth every 4 (four) hours as needed for moderate pain. 12/27/20   Samella Parr, NP  potassium chloride SA (KLOR-CON) 20 MEQ tablet Take 1 tablet (20 mEq total) by mouth daily. 01/04/21   Samella Parr, NP  pregabalin (LYRICA) 100 MG capsule Take 1 capsule (100 mg total) by mouth 3 (three) times daily. 12/27/20   Samella Parr, NP  rOPINIRole (REQUIP) 4 MG tablet Take 1 tablet (4 mg total) by mouth at bedtime. 12/27/20   Samella Parr, NP  senna-docusate (SENOKOT-S) 8.6-50 MG tablet Take 2 tablets by mouth daily. 12/27/20   Samella Parr, NP  silver sulfADIAZINE (SILVADENE) 1 % cream Apply topically daily. 01/04/21   Samella Parr, NP  topiramate (TOPAMAX) 25 MG tablet Take 1 tablet (25 mg total) by mouth 2 (two) times daily. 12/27/20   Samella Parr, NP  traMADol (ULTRAM) 50 MG tablet Take 1 tablet (50  mg total) by mouth every 6 (six) hours as needed for severe pain. 11/02/20   Shelly Coss, MD  traZODone (DESYREL) 50 MG tablet Take 1 tablet (50 mg total) by mouth at bedtime. 12/27/20   Samella Parr, NP  ziprasidone (GEODON) 20  MG capsule Take 1 capsule (20 mg total) by mouth daily. 01/04/21   Samella Parr, NP    Allergies    Amoxicillin, Bee venom, Penicillins, Penicillin g, Adhesive [tape], Latex, and Vancomycin  Review of Systems   Review of Systems  Constitutional:  Positive for activity change, appetite change, chills, fatigue and fever.  HENT: Negative.    Respiratory: Negative.    Cardiovascular: Negative.   Gastrointestinal:  Positive for diarrhea. Negative for nausea and vomiting.  Genitourinary:  Positive for dysuria, flank pain, frequency and urgency. Negative for decreased urine volume, vaginal bleeding, vaginal discharge and vaginal pain.  Skin: Negative.   Neurological: Negative.   Hematological: Negative.    Physical Exam Updated Vital Signs BP 109/64   Pulse 87   Temp 98.7 F (37.1 C) (Oral)   Resp (!) 21   Ht 5\' 3"  (1.6 m)   SpO2 96%   BMI 77.25 kg/m   Physical Exam Vitals and nursing note reviewed.  Constitutional:      Appearance: She is obese. She is not ill-appearing or toxic-appearing.  HENT:     Head: Normocephalic and atraumatic.     Nose: Nose normal.     Mouth/Throat:     Mouth: Mucous membranes are moist.     Pharynx: Oropharynx is clear. Uvula midline. No oropharyngeal exudate or posterior oropharyngeal erythema.     Tonsils: No tonsillar exudate.  Eyes:     General: Lids are normal. Vision grossly intact.        Right eye: No discharge.        Left eye: No discharge.     Extraocular Movements: Extraocular movements intact.     Conjunctiva/sclera: Conjunctivae normal.     Pupils: Pupils are equal, round, and reactive to light.  Neck:     Trachea: Trachea normal.  Cardiovascular:     Rate and Rhythm: Normal rate and regular rhythm.     Pulses: Normal pulses.     Heart sounds: Normal heart sounds. No murmur heard. Pulmonary:     Effort: Pulmonary effort is normal. No respiratory distress.     Breath sounds: Normal breath sounds. No wheezing or rales.   Abdominal:     General: Bowel sounds are normal. There is no distension.     Palpations: Abdomen is soft.     Tenderness: There is no abdominal tenderness.  Musculoskeletal:        General: No deformity.     Cervical back: Normal range of motion and neck supple. No edema, rigidity or crepitus. No pain with movement, spinous process tenderness or muscular tenderness.  Lymphadenopathy:     Cervical: No cervical adenopathy.  Skin:    General: Skin is warm and dry.  Neurological:     Mental Status: She is alert. Mental status is at baseline.  Psychiatric:        Mood and Affect: Mood normal.    ED Results / Procedures / Treatments   Labs (all labs ordered are listed, but only abnormal results are displayed) Labs Reviewed  COMPREHENSIVE METABOLIC PANEL - Abnormal; Notable for the following components:      Result Value   Creatinine, Ser 1.32 (*)    Albumin 3.3 (*)  GFR, Estimated 51 (*)    All other components within normal limits  CBC WITH DIFFERENTIAL/PLATELET - Abnormal; Notable for the following components:   WBC 11.1 (*)    Hemoglobin 11.4 (*)    MCH 25.7 (*)    RDW 18.9 (*)    All other components within normal limits  URINALYSIS, ROUTINE W REFLEX MICROSCOPIC - Abnormal; Notable for the following components:   Color, Urine AMBER (*)    APPearance CLOUDY (*)    Hgb urine dipstick MODERATE (*)    Ketones, ur 20 (*)    Protein, ur 100 (*)    Nitrite POSITIVE (*)    Leukocytes,Ua LARGE (*)    WBC, UA >50 (*)    Bacteria, UA FEW (*)    All other components within normal limits  RESP PANEL BY RT-PCR (FLU A&B, COVID) ARPGX2  URINE CULTURE  CULTURE, BLOOD (ROUTINE X 2)  CULTURE, BLOOD (ROUTINE X 2)  LIPASE, BLOOD  LACTIC ACID, PLASMA  PROTIME-INR  APTT  I-STAT BETA HCG BLOOD, ED ([facility], [facility], [facility] ONLY)  TROPONIN I (HIGH SENSITIVITY)  TROPONIN I (HIGH SENSITIVITY)    EKG EKG Interpretation  Date/Time:  Thursday January 25 2021 23:18:06 EDT Ventricular Rate:  88 PR  Interval:  165 QRS Duration: 168 QT Interval:  439 QTC Calculation: 532 R Axis:   42 Text Interpretation: Sinus rhythm Right bundle branch block No significant change since last tracing Confirmed by Fredia Sorrow [phone]) on 01/25/2021 11:42:32 PM  Radiology DG Chest Portable 1 View  Result Date: 01/25/2021 CLINICAL DATA:  Weakness EXAM: PORTABLE CHEST 1 VIEW COMPARISON:  10/30/2020 FINDINGS: Cardiac shadow is prominent but accentuated by the portable technique. Lungs are well aerated bilaterally. Very mild vascular congestion is noted without edema. No infiltrate is seen. No bony abnormality is noted. IMPRESSION: Mild vascular congestion without edema. No other focal abnormality is seen. Electronically Signed   By: Inez Catalina M.D.   On: 01/25/2021 22:27    Procedures Procedures   Medications Ordered in ED Medications  cefTRIAXone (ROCEPHIN) 1 g in sodium chloride 0.9 % 100 mL IVPB (has no administration in time range)    ED Course  I have reviewed the triage vital signs and the nursing notes.  Pertinent labs & imaging results that were available during my care of the patient were reviewed by me and considered in my medical decision making (see chart for details).    MDM Rules/Calculators/A&P                         45 year old female with history of recurrent UTIs and urosepsis last month who presents to the ED with flank pain, dysuria, and urinary frequency.  Hypertensive on intake, vital signs otherwise normal.  Cardiopulmonary exam is normal, abdominal exam is benign.  Bilateral CVA tenderness to palpation.  Patient is neurovascular intact in all 4 extremities without focal deficit on neuro exam.  CBC with leukocytosis of 11,000.  CMP with creatinine of 1.3 near patient's baseline.  UA concerning for infection with moderate hemoglobin, positive nitrates, large leukocytes, and many bacteria and greater than 50 white cells.  Lipase is normal, troponin is negative, respiratory  pathogen panel is negative.  Blood cultures and urine cultures are pending.  Patient has tolerated Rocephin in the past and will administer Rocephin despite history of anaphylaxis with penicillins.  At time of shift change care of this patient signed out to oncoming ED provider Dr. Christy Gentles.  All  pertinent HPI, physical exam, laboratory findings were discussed with him prior to my partner.  Patient is pending CT renal study and call for admission at this time.  I appreciate his collaboration in the care of this patient.  Heather [name] voiced understanding of her medical evaluation and treatment plan.  Her questions was answered to her expressed satisfaction.  Return precautions are given.  Patient is well-appearing, stable, and appropriate for discharge at this time.  This chart was dictated using voice recognition software, Dragon. Despite the best efforts of this provider to proofread and correct errors, errors may still occur which can change documentation meaning.  Final Clinical Impression(s) / ED Diagnoses Final diagnoses:  None    Rx / DC Orders ED Discharge Orders     None        Aura Dials 01/26/21 0140    Ripley Fraise, MD 01/26/21 [phone]

## 2021-01-26 NOTE — Assessment & Plan Note (Signed)
-  She does not appear to be taking medications for this issue at this time -Will add prn IV Hydralazine

## 2021-01-26 NOTE — ED Notes (Signed)
Lunch ordered 

## 2021-01-26 NOTE — Assessment & Plan Note (Signed)
-  Body mass index is 77.25 kg/m..  -Weight loss should be encouraged -Outpatient PCP/bariatric medicine/bariatric surgery f/u encouraged -Her overall prognosis is clearly impacted by this issue

## 2021-01-26 NOTE — Assessment & Plan Note (Signed)
-  she does not appear to be taking medications for this issue at this time -Last A1c was 5.6, indicating that no meds are needed at this time -Will not plan to follow while hospitalized unless her glucose is uptrending on fasting labs

## 2021-01-26 NOTE — H&P (Addendum)
History and Physical    Heather [name] WVP:[id number] DOB: [birth year] DOA: 01/25/2021  PCP: Lucianne Lei, MD Consultants:  Abner Greenspan - urology (appt in December); [place] Patient coming from:  Homeless; NOK: Heather [name], [phone]  Chief Complaint: Urinary symptoms  HPI: Heather [name] is a 45 y.o. female with medical history significant of COPD; anxiety/depression/schizoaffective d/o; DM; HLD; OSA; and morbid obesity presenting with urinary symptoms.  She was seen on 10/1 at [facility] for mental health issues and she had a +urine culture.  A week later she went to [facility] for urinary symptoms.  They raised concern for mass on her kidney.  She has been having chills, sweats, confusion, loses her train of thought, blood in her urine, She "couldn't feel nothing, couldn't feel me peeing at all."    Needs mental health evaluation with PASSR letter, wants to go to nursing facility.   ED Course: [place]ry[place], per Dr. Marlowe Sax:  45 year old recently admitted to [facility] for pyelonephritis here with the same problem.  Endorsing urinary symptoms and back pain.  UA with signs of infection.  No signs of sepsis.  She was given ceftriaxone.  CT negative for stone or abscess.  She is homeless and is having difficulty managing her medications.   Review of Systems: As per HPI; otherwise review of systems reviewed and negative.   Ambulatory Status:  Ambulates with a walker  COVID Vaccine Status:  Complete  Past Medical History:  Diagnosis Date   Anxiety    Arthritis    Asthma    Back pain    Constipation    COPD (chronic obstructive pulmonary disease) ([place])    Depression    Diabetes mellitus without complication ([place])    patient denies but states she has hyperglycemia-diet controlled   Dysrhythmia    DR [facility]     Ectopic pregnancy 5462   Eosinophilic esophagitis    Diagnosed at [facility] 06/16/2013, untreated   GERD (gastroesophageal reflux disease)     HEARTBURN   TUMS   High cholesterol    IBS (irritable bowel syndrome)    Leukocytosis 07/28/2008   Qualifier: Diagnosis of  By: Jonna Munro MD, Cornel[place]     Morbid obesity ([place])    Neuromuscular disorder ([place])    RESTLESS LEG    Obesity    Schizoaffective disorder, bipolar type ([place])    Sleep apnea    CPAP- in process of restarting     Past Surgical History:  Procedure Laterality Date   BIOPSY  08/13/2018   Procedure: BIOPSY;  Surgeon: Yetta Flock, MD;  Location: [facility];  Service: Gastroenterology;;   CESAREAN SECTION MULTI-GESTATIONAL N/A 02/03/2017   Procedure: [place];  Surgeon: Jonnie Kind, MD;  Location: [place];  Service: Obstetrics;  Laterality: N/A;   CHOLECYSTECTOMY     COLONOSCOPY WITH PROPOFOL N/A 08/13/2018   Procedure: COLONOSCOPY WITH PROPOFOL;  Surgeon: Yetta Flock, MD;  Location: [facility];  Service: Gastroenterology;  Laterality: N/A;   DENTAL SURGERY     ESOPHAGOGASTRODUODENOSCOPY  May 2007   Dr. Gala Romney: Normal esophagus, stomach, D1, D2   ESOPHAGOGASTRODUODENOSCOPY  06/16/2013   Dr. Carlton Adam, eosinophilic esophagitis, reactive gastropathy, no esophageal dilation   ESOPHAGOGASTRODUODENOSCOPY (EGD) WITH PROPOFOL N/A 08/13/2018   Procedure: ESOPHAGOGASTRODUODENOSCOPY (EGD) WITH PROPOFOL;  Surgeon: Yetta Flock, MD;  Location: [facility];  Service: Gastroenterology;  Laterality: N/A;   POLYPECTOMY  08/13/2018   Procedure: POLYPECTOMY;  Surgeon: Yetta Flock, MD;  Location:  [facility];  Service: Gastroenterology;;   TONSILLECTOMY     TOOTH EXTRACTION  10/28/2011   Procedure: DENTAL RESTORATION/EXTRACTIONS;  Surgeon: Gae Bon, DDS;  Location: [facility] OR;  Service: Oral Surgery;;   UPPER GASTROINTESTINAL ENDOSCOPY      Social History   Socioeconomic History   Marital status: Legally Separated    Spouse name: Gwyndolyn Saxon   Number of children: 0   Years of education: Not on file   Highest  education level: Not on file  Occupational History   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 had a pack the other day  Other Topics Concern   Not on file  Social History Narrative   Not on file   Social Determinants of Health   Financial Resource Strain: Not on file  Food Insecurity: Not on file  Transportation Needs: Not on file  Physical Activity: Not on file  Stress: Not on file  Social Connections: Not on file  Intimate Partner Violence: Not on file    Allergies  Allergen Reactions   Amoxicillin Anaphylaxis   Bee Venom Shortness Of Breath   Penicillins Anaphylaxis    Tolerates cephalexin and ceftriaxone Has patient had a PCN reaction causing immediate rash, facial/tongue/throat swelling, SOB or lightheadedness with hypotension: No Has patient had a PCN reaction causing severe rash involving mucus membranes or skin necrosis: No Has patient had a PCN reaction that required hospitalization No Has patient had a PCN reaction occurring within the last 10 years: No If all of the above answers are "NO", then may proceed with Cephalosporin use.'  REACTION: Angioedema   Penicillin G    Adhesive [Tape] Rash   Latex Rash   Vancomycin Other (See Comments)    Pt can tolerate Vancomycin but did cause Vancomycin Infusion Reaction.  Recommend to pre-medicate with Benadryl before doses administered.      Family History  Problem Relation Age of Onset   Depression  Mother    Anxiety disorder Mother    High blood pressure Mother    Bipolar disorder Mother    Eating disorder Mother    Obesity Mother    Hypertension Sister    Allergic rhinitis Sister    Colon polyps Maternal Grandmother        21s   Diabetes Maternal Grandmother    Anxiety disorder Maternal Grandmother    COPD Maternal Grandmother    Crohn's disease Maternal Aunt    Cancer Maternal Grandfather        prostate   HIV/AIDS Father    Eating disorder Father    Obesity Father    Liver disease Neg Hx    Angioedema Neg Hx    Eczema Neg Hx    Immunodeficiency Neg Hx    Asthma Neg Hx    Urticaria Neg Hx    Colon cancer Neg Hx  Esophageal cancer Neg Hx    Rectal cancer Neg Hx    Stomach cancer Neg Hx     Prior to Admission medications   Medication Sig Start Date End Date Taking? Authorizing Provider  acetaminophen (TYLENOL) 325 MG tablet Take 650 mg by mouth every 6 (six) hours as needed for mild pain, fever or headache.    [provider]  albuterol (VENTOLIN HFA) 108 (90 Base) MCG/ACT inhaler Inhale 2 puffs into the lungs every 4 (four) hours as needed for wheezing or shortness of breath. 12/27/20   Samella Parr, NP  ascorbic acid (VITAMIN C) 500 MG tablet Take 1 tablet (500 mg total) by mouth daily. 12/27/20   Samella Parr, NP  buPROPion (WELLBUTRIN XL) 300 MG 24 hr tablet Take 1 tablet (300 mg total) by mouth daily. 01/04/21   Samella Parr, NP  clonazePAM (KLONOPIN) 0.5 MG tablet Take 1/2 tablet (0.25 mg total) by mouth daily as needed (panic attacks). 01/04/21   Samella Parr, NP  clotrimazole (LOTRIMIN) 1 % cream Apply topically 2 (two) times daily. 11/02/20   Shelly Coss, MD  vitamin B-12 (CYANOCOBALAMIN) 1000 MCG tablet Take 1 tablet (1,000 mcg total) by mouth daily. 12/27/20   Samella Parr, NP  diclofenac Sodium (VOLTAREN) 1 % GEL Apply 4 g topically 4 (four) times daily. 12/27/20   Samella Parr, NP  fluticasone (FLONASE) 50 MCG/ACT nasal spray  Place 2 sprays into both nostrils 2 (two) times daily. 12/27/20   Samella Parr, NP  furosemide (LASIX) 40 MG tablet Take 1 tablet (40 mg total) by mouth daily. 01/04/21   Samella Parr, NP  methocarbamol (ROBAXIN) 500 MG tablet Take 1 tablet (500 mg total) by mouth 4 (four) times daily. 12/27/20   Samella Parr, NP  mometasone-formoterol (DULERA) 100-5 MCG/ACT AERO Inhale 2 puffs into the lungs 2 (two) times daily. 12/27/20   Samella Parr, NP  montelukast (SINGULAIR) 10 MG tablet Take 1 tablet (10 mg total) by mouth at bedtime. 08/18/20   Dwyane Dee, MD  nystatin (MYCOSTATIN/NYSTOP) powder Apply topically 3 (three) times daily. 12/27/20   Samella Parr, NP  oxyCODONE (OXY IR/ROXICODONE) 5 MG immediate release tablet Take 1 tablet (5 mg total) by mouth every 4 (four) hours as needed for moderate pain. 12/27/20   Samella Parr, NP  potassium chloride SA (KLOR-CON) 20 MEQ tablet Take 1 tablet (20 mEq total) by mouth daily. 01/04/21   Samella Parr, NP  pregabalin (LYRICA) 100 MG capsule Take 1 capsule (100 mg total) by mouth 3 (three) times daily. 12/27/20   Samella Parr, NP  rOPINIRole (REQUIP) 4 MG tablet Take 1 tablet (4 mg total) by mouth at bedtime. 12/27/20   Samella Parr, NP  senna-docusate (SENOKOT-S) 8.6-50 MG tablet Take 2 tablets by mouth daily. 12/27/20   Samella Parr, NP  silver sulfADIAZINE (SILVADENE) 1 % cream Apply topically daily. 01/04/21   Samella Parr, NP  topiramate (TOPAMAX) 25 MG tablet Take 1 tablet (25 mg total) by mouth 2 (two) times daily. 12/27/20   Samella Parr, NP  traMADol (ULTRAM) 50 MG tablet Take 1 tablet (50 mg total) by mouth every 6 (six) hours as needed for severe pain. 11/02/20   Shelly Coss, MD  traZODone (DESYREL) 50 MG tablet Take 1 tablet (50 mg total) by mouth at bedtime. 12/27/20   Samella Parr, NP  ziprasidone (GEODON) 20 MG capsule Take 1 capsule (20  mg total) by mouth daily. 01/04/21   Samella Parr, NP     Physical Exam: Vitals:   01/26/21 0630 01/26/21 0700 01/26/21 0735 01/26/21 0800  BP: 94/80 112/62 125/82   Pulse: 84 74 79   Resp: 16 13 15    Temp:   97.9 F (36.6 C)   TempSrc:   Oral   SpO2: 97% 90% 98%   Weight:    (!) 197.8 kg  Height:    5\' 3"  (1.6 m)     General:  Appears calm and comfortable and is in NAD Eyes:  PERRL, EOMI, normal lids, iris ENT:  grossly normal hearing, lips & tongue, mmm; edentulous Neck:  no LAD, masses or thyromegaly Cardiovascular:  RRR, no m/r/g. No LE edema.  Respiratory:   CTA bilaterally with no wheezes/rales/rhonchi.  Normal respiratory effort. Abdomen:  soft, NT, ND Skin:  no rash or induration seen on limited exam Musculoskeletal:  grossly normal tone BUE/BLE, good ROM, no bony abnormality Psychiatric:  grossly normal mood and affect with some emotional lability, speech fluent and appropriate, AOx3 Neurologic:  CN 2-12 grossly intact, moves all extremities in coordinated fashion   Radiological Exams on Admission: Independently reviewed - see discussion in A/P where applicable  DG Chest Portable 1 View  Result Date: 01/25/2021 CLINICAL DATA:  Weakness EXAM: PORTABLE CHEST 1 VIEW COMPARISON:  10/30/2020 FINDINGS: Cardiac shadow is prominent but accentuated by the portable technique. Lungs are well aerated bilaterally. Very mild vascular congestion is noted without edema. No infiltrate is seen. No bony abnormality is noted. IMPRESSION: Mild vascular congestion without edema. No other focal abnormality is seen. Electronically Signed   By: Inez Catalina M.D.   On: 01/25/2021 22:27   CT Renal Stone Study  Result Date: 01/26/2021 CLINICAL DATA:  Bilateral flank pain EXAM: CT ABDOMEN AND PELVIS WITHOUT CONTRAST TECHNIQUE: Multidetector CT imaging of the abdomen and pelvis was performed following the standard protocol without IV contrast. COMPARISON:  01/14/2021 FINDINGS: Lower chest: No acute abnormality. Hepatobiliary: No focal liver  abnormality is seen. Status post cholecystectomy. No biliary dilatation. Pancreas: Unremarkable. No pancreatic ductal dilatation or surrounding inflammatory changes. Spleen: Normal in size without focal abnormality. Adrenals/Urinary Tract: Adrenal glands are within normal limits. Left kidney again demonstrates staghorn type calculus which extends into the renal pelvis and towards the UPJ although no obstructive changes are seen. Left ureter is unremarkable. Right kidney demonstrates multiple nonobstructing renal calculi similar to that seen on prior exam. Large hypodense lesion is noted in the upper pole of the right kidney measuring up to 8.6 cm similar to that seen on the prior exam in dating back to 2019. No other mass is seen. The right ureter is unremarkable. Bladder is decompressed. Some persistent bladder wall thickening is noted which may be related underlying infection. Stomach/Bowel: Colon shows no obstructive or inflammatory changes. The appendix is within normal limits. Small bowel and stomach are unremarkable. Vascular/Lymphatic: No significant vascular findings are present. No enlarged abdominal or pelvic lymph nodes. Reproductive: Uterus and bilateral adnexa are unremarkable. Other: No abdominal wall hernia or abnormality. No abdominopelvic ascites. Musculoskeletal: Degenerative changes of lumbar spine are again seen. IMPRESSION: Stable bilateral renal calculi.  No obstructive changes are seen. Stable hypodense lesion in the upper pole of the right kidney dating back to 2019 consistent with a complex cyst. Persistent bladder wall thickening which may represent an underlying UTI. Correlate with laboratory values. Electronically Signed   By: Inez Catalina M.D.   On: 01/26/2021 02:12  EKG: Independently reviewed.  NSR with rate 88; RBBB with NSCSLT   Labs on Admission: I have personally reviewed the available labs and imaging studies at the time of the admission.  Pertinent labs:   BUN  15/Creatinine 1.32/GFR 51 - stable HS troponin 7 WBC 11.1 HCG <5 UA: moderate Hgb, 20 ketones, large LE, +nitrite, 100 protein, few bacteria, >50 WBC COVID/flu negative INR 1.2 Lactate 1.6 10/1 urine culture + E faecalis sensitive only to Vanc 10/8 urine culture contaminated 10/8 blood cultures negative   Assessment/Plan Active Problems:   RLS (restless legs syndrome)   Cystitis   Enterococcus faecalis infection   Social problem   Class 3 severe obesity with serious comorbidity and body mass index (BMI) greater than or equal to 70 in adult [facility])   CKD (chronic kidney disease), stage III (HCC)   Mood disorder (HCC)   Chronic pain disorder   Essential hypertension   Asthma   DM type 2 (diabetes mellitus, type 2) (HCC)   OSA (obstructive sleep apnea)   RLS (restless legs syndrome) -Continue Requip  Cystitis -Patient with h/o Enterococcus faecalis UTI on culture from 10/1, sensitive only to Vanc -She was subsequently admitted at [facility] on 10/8 and treated for 5 days with Cefepime; she was diagnosed with sepsis and cultures were negative (urine culture contaminated) -She returns with ongoing symptoms - which is reasonable since 10/1 UTI has not apparently been treated -Will admit for Vanc therapy; she is likely to need to complete her entire treatment course while hospitalized given 10/1 sensitivities -She has h/o red man syndrome and reports need for benadryl with infusion (although generally red man just requires a slower infusion rate) -She also reports h/o "concern for renal cancer" but imaging indicates that this is likely a stable complex cyst -She also has h/o staghorn calculi and is planned for outpatient urology f/u - which appears to be reasonable -Will notify Dr. Abner Greenspan of admission -No current concern for sepsis or pyelonephritis  Social problem -Patient reports that she is homeless, has been living in a motel but needs placement in SNF -She has a Medicaid letter  indicating need for psychiatric evaluation in conjunction with PASSR number -TOC team consult requested  Class 3 severe obesity with serious comorbidity and body mass index (BMI) greater than or equal to 70 in adult [facility]) -Body mass index is 77.25 kg/m..  -Weight loss should be encouraged -Outpatient PCP/bariatric medicine/bariatric surgery f/u encouraged -Her overall prognosis is clearly impacted by this issue  CKD (chronic kidney disease), stage III (HCC) -Stable stage 3a CKD -Will recheck BMP in AM to ensure ongoing stability  Mood disorder ([place]) -Continue home meds - Wellbutrin, Klonopin, Trazodone, Geodon  Chronic pain disorder -I have reviewed this patient in the [place] Controlled Substances Reporting System.  She has not been routinely receiving prescriptions for controlled substances and so is not at particularly high risk of opioid misuse, diversion, or overdose. -Continue home meds - Robaxin, Lyrica, Topamax  OSA (obstructive sleep apnea) -Does not appear to be on CPAP -May need to consider once disposition is clear  DM type 2 (diabetes mellitus, type 2) ([place]) -she does not appear to be taking medications for this issue at this time -Last A1c was 5.6, indicating that no meds are needed at this time -Will not plan to follow while hospitalized unless her glucose is uptrending on fasting labs  Asthma -Continue home meds - Flonase, Dulera, Singulair, Albuterol prn  Essential hypertension -She does not appear to  be taking medications for this issue at this time -Will add prn IV Hydralazine       Note: This patient has been tested and is negative for the novel coronavirus COVID-19. The patient has been fully vaccinated against COVID-19.   Level of care: Med-Surg DVT prophylaxis:  Lovenox  Code Status:  Full - confirmed with patient Family Communication: None present; I spoke with the patient's pastor by telephone at the time of admission. Disposition Plan:  The  patient is from: homeless  Anticipated d/c is to: SNF  Anticipated d/c date will depend on clinical response to treatment, likely with completion of UTI therapy  Patient is currently: acutely ill Consults called: TOC team; nutrition  Admission status:  Admit - It is my clinical opinion that admission to INPATIENT is reasonable and necessary because of the expectation that this patient will require hospital care that crosses at least 2 midnights to treat this condition based on the medical complexity of the problems presented.  Given the aforementioned information, the predictability of an adverse outcome is felt to be significant.    Karmen Bongo MD [facility]   How to contact the [facility] Attending or Consulting provider [place] or covering provider during after hours [place], for this patient?  Check the care team in [facility] and look for a) attending/consulting [facility] provider listed and b) the [facility] team listed Log into [email or web address] and use [place]'s universal password to access. If you do not have the password, please contact the hospital operator. Locate the [facility] provider you are looking for under [facility] and page to a number that you can be directly reached. If you still have difficulty reaching the provider, please page the [facility] (Director on Call) for the Hospitalists listed on amion for assistance.   01/26/2021, 10:21 AM

## 2021-01-27 DIAGNOSIS — G894 Chronic pain syndrome: Secondary | ICD-10-CM

## 2021-01-27 LAB — CBC
HCT: 35.2 % — ABNORMAL LOW (ref 36.0–46.0)
Hemoglobin: 10.6 g/dL — ABNORMAL LOW (ref 12.0–15.0)
MCH: 25.5 pg — ABNORMAL LOW (ref 26.0–34.0)
MCHC: 30.1 g/dL (ref 30.0–36.0)
MCV: 84.6 fL (ref 80.0–100.0)
Platelets: 318 10*3/uL (ref 150–400)
RBC: 4.16 MIL/uL (ref 3.87–5.11)
RDW: 19.2 % — ABNORMAL HIGH (ref 11.5–15.5)
WBC: 8.4 10*3/uL (ref 4.0–10.5)
nRBC: 0 % (ref 0.0–0.2)

## 2021-01-27 LAB — BASIC METABOLIC PANEL
Anion gap: 6 (ref 5–15)
BUN: 11 mg/dL (ref 6–20)
CO2: 24 mmol/L (ref 22–32)
Calcium: 8.8 mg/dL — ABNORMAL LOW (ref 8.9–10.3)
Chloride: 107 mmol/L (ref 98–111)
Creatinine, Ser: 1.11 mg/dL — ABNORMAL HIGH (ref 0.44–1.00)
GFR, Estimated: >60 mL/min (ref >60)
Glucose, Bld: 88 mg/dL (ref 70–99)
Potassium: 3.2 mmol/L — ABNORMAL LOW (ref 3.5–5.1)
Sodium: 137 mmol/L (ref 135–145)

## 2021-01-27 LAB — URINE CULTURE

## 2021-01-27 MED ORDER — POTASSIUM CHLORIDE CRYS ER 20 MEQ PO TBCR
40.0000 meq | EXTENDED_RELEASE_TABLET | Freq: Once | ORAL | Status: AC
  Administered 2021-01-27: 40 meq via ORAL
  Filled 2021-01-27: qty 2

## 2021-01-27 NOTE — Progress Notes (Signed)
Nutrition Consult  RD consulted for nutritional goals. RD working remotely. Unable to reach patient by phone.  Body mass index is 72.6 kg/m. Pt meets criteria for class 3, extreme/morbid obesity based on current BMI. Patient is not appropriate for weight loss education at this time, given homelessness, acute hospitalization, and need for mental health evaluation. Weight loss may be more appropriate as an outpatient depending on disposition.   Current diet order is regular, patient is consuming approximately 100% of meals at this time. Labs and medications reviewed. No further nutrition interventions warranted at this time. RD contact information provided. If additional nutrition issues arise, please re-consult RD.  Lucas Mallow, RD, LDN, CNSC Please refer to [facility] for contact information.

## 2021-01-27 NOTE — Progress Notes (Signed)
PROGRESS NOTE    Heather [name]  ATF:[id number] DOB: Nov 13, 1975 DOA: 01/25/2021 PCP: Lucianne Lei, MD    Chief Complaint  Patient presents with   Weakness   Urinary Frequency   Diarrhea    Brief Narrative: Heather [name] is a 45 y.o. female with medical history significant of COPD; anxiety/depression/schizoaffective d/o; DM; HLD; OSA; and morbid obesity presenting with urinary symptoms.  She was seen on 10/1 at [facility] for mental health issues and she had a +urine culture. She was admitted for UTI.  SHE IS ALSO homeless and does not have a place to go .  She Needs mental health evaluation with PASSR letter, wants to go to nursing facility. TOC will be consulted.  Therapy eval ordered.    Assessment & Plan:   Active Problems:   Essential hypertension   Asthma   RLS (restless legs syndrome)   DM type 2 (diabetes mellitus, type 2) (HCC)   OSA (obstructive sleep apnea)   Class 3 severe obesity with serious comorbidity and body mass index (BMI) greater than or equal to 70 in adult [facility])   CKD (chronic kidney disease), stage III (HCC)   Cystitis   Social problem   Enterococcus faecalis infection   Mood disorder ([place])   Chronic pain disorder   Cystitis:  Sec to E fecalis UTI.  Urine cultures are pending.  She was started on IV vancomycin and levaquin.  Urology consulted on admission.    RLS: Resume requip.    Type 2 DM: CBG (last 3)  No results for input(s): GLUCAP in the last 72 hours. Diet controlled.     OSA on CPAP.    Stage 3 a CKD:  Creatinine improving with IV fluids.    Body mass index is 72.6 kg/m. Morbid obesity.    ASTHMA:  No wheezing heard.  Resume home meds.    Hypertension:  Well controlled.   Chronic pain syndrome:  -resume home meds.        DVT prophylaxis: lovenox.  Code Status: (full code.  Family Communication: none at bedside.  Disposition:   Status is: Inpatient  Remains inpatient appropriate because: of  unsafe d/c plan.        Consultants:  Urology.   Procedures: none.   Antimicrobials:  Antibiotics Given (last 72 hours)     Date/Time Action Medication Dose Rate   01/26/21 0220 New Bag/Given   cefTRIAXone (ROCEPHIN) 1 g in sodium chloride 0.9 % 100 mL IVPB 1 g 200 mL/hr   01/26/21 1122 New Bag/Given   vancomycin (VANCOCIN) 2,500 mg in sodium chloride 0.9 % 500 mL IVPB 2,500 mg 166.7 mL/hr   01/26/21 2217 Given   levofloxacin (LEVAQUIN) tablet 500 mg 500 mg          Subjective: Worried about complex cyst.   Objective: Vitals:   01/26/21 2235 01/27/21 0230 01/27/21 0502 01/27/21 0933  BP: 114/67 (!) 89/63 (!) 100/56 114/67  Pulse: 87 72 64 81  Resp: 18 18 18 18   Temp: 98.6 F (37 C)  98.4 F (36.9 C) 98.3 F (36.8 C)  TempSrc: Oral  Oral   SpO2: 97% 99% 98% 100%  Weight:   (!) 185.9 kg   Height:        Intake/Output Summary (Last 24 hours) at 01/27/2021 1637 Last data filed at 01/27/2021 1237 Gross per 24 hour  Intake 1885.21 ml  Output 0 ml  Net 1885.21 ml   Filed Weights   01/26/21 0800 01/26/21 1435  01/27/21 0502  Weight: (!) 197.8 kg (!) 186.2 kg (!) 185.9 kg    Examination:  General exam: Appears calm and comfortable  Respiratory system: Clear to auscultation. Respiratory effort normal. Cardiovascular system: S1 & S2 heard, RRR. No JVD,  Gastrointestinal system: Abdomen is nondistended, soft and nontender.  Normal bowel sounds heard. Central nervous system: Alert and oriented. No focal neurological deficits. Extremities: Symmetric 5 x 5 power. Skin: No rashes, lesions or ulcers Psychiatry:  Mood & affect appropriate.     Data Reviewed: I have personally reviewed following labs and imaging studies  CBC: Recent Labs  Lab 01/25/21 2143 01/27/21 0417  WBC 11.1* 8.4  NEUTROABS 7.3  --   HGB 11.4* 10.6*  HCT 37.9 35.2*  MCV 85.6 84.6  PLT 362 782    Basic Metabolic Panel: Recent Labs  Lab 01/25/21 2143 01/27/21 0417  NA 137 137   K 3.6 3.2*  CL 104 107  CO2 22 24  GLUCOSE 98 88  BUN 15 11  CREATININE 1.32* 1.11*  CALCIUM 9.4 8.8*    GFR: Estimated Creatinine Clearance: 109.1 mL/min (A) (by C-G formula based on SCr of 1.11 mg/dL (H)).  Liver Function Tests: Recent Labs  Lab 01/25/21 2143  AST 27  ALT 22  ALKPHOS 61  BILITOT 0.6  PROT 7.0  ALBUMIN 3.3*    CBG: No results for input(s): GLUCAP in the last 168 hours.   Recent Results (from the past 240 hour(s))  Urine Culture     Status: Abnormal   Collection Time: 01/25/21 10:06 PM   Specimen: Urine, Clean Catch  Result Value Ref Range Status   Specimen Description URINE, CLEAN CATCH  Final   Special Requests   Final    NONE Performed at [facility], [address], [place], [place] [address]    Culture MULTIPLE SPECIES PRESENT, SUGGEST RECOLLECTION (A)  Final   Report Status 01/27/2021 FINAL  Final  Resp Panel by RT-PCR (Flu A&B, Covid) Nasopharyngeal Swab     Status: None   Collection Time: 01/25/21 11:03 PM   Specimen: Nasopharyngeal Swab; Nasopharyngeal(NP) swabs in vial transport medium  Result Value Ref Range Status   SARS Coronavirus 2 by RT PCR NEGATIVE NEGATIVE Final    Comment: (NOTE) SARS-CoV-2 target nucleic acids are NOT DETECTED.  The SARS-CoV-2 RNA is generally detectable in upper respiratory specimens during the acute phase of infection. The lowest concentration of SARS-CoV-2 viral copies this assay can detect is 138 copies/mL. A negative result does not preclude SARS-Cov-2 infection and should not be used as the sole basis for treatment or other patient management decisions. A negative result may occur with  improper specimen collection/handling, submission of specimen other than nasopharyngeal swab, presence of viral mutation(s) within the areas targeted by this assay, and inadequate number of viral copies(<138 copies/mL). A negative result must be combined with clinical observations, patient history, and  epidemiological information. The expected result is Negative.  Fact Sheet for Patients:  EntrepreneurPulse.com.au  Fact Sheet for Healthcare Providers:  IncredibleEmployment.be  This test is no t yet approved or cleared by the [place] FDA and  has been authorized for detection and/or diagnosis of SARS-CoV-2 by FDA under an Emergency Use Authorization (EUA). This EUA will remain  in effect (meaning this test can be used) for the duration of the COVID-19 declaration under Section 564(b)(1) of the Act, 21 U.S.C.section 360bbb-3(b)(1), unless the authorization is terminated  or revoked sooner.  Influenza A by PCR NEGATIVE NEGATIVE Final   Influenza B by PCR NEGATIVE NEGATIVE Final    Comment: (NOTE) The Xpert Xpress SARS-CoV-2/FLU/RSV plus assay is intended as an aid in the diagnosis of influenza from Nasopharyngeal swab specimens and should not be used as a sole basis for treatment. Nasal washings and aspirates are unacceptable for Xpert Xpress SARS-CoV-2/FLU/RSV testing.  Fact Sheet for Patients: EntrepreneurPulse.com.au  Fact Sheet for Healthcare Providers: IncredibleEmployment.be  This test is not yet approved or cleared by the [place] FDA and has been authorized for detection and/or diagnosis of SARS-CoV-2 by FDA under an Emergency Use Authorization (EUA). This EUA will remain in effect (meaning this test can be used) for the duration of the COVID-19 declaration under Section 564(b)(1) of the Act, 21 U.S.C. section 360bbb-3(b)(1), unless the authorization is terminated or revoked.  Performed at [facility], [place] [address] [place], [place] [address]   Blood Culture (routine x 2)     Status: None (Preliminary result)   Collection Time: 01/25/21 11:45 PM   Specimen: BLOOD RIGHT HAND  Result Value Ref Range Status   Specimen Description BLOOD RIGHT HAND  Final   Special  Requests   Final    BOTTLES DRAWN AEROBIC AND ANAEROBIC Blood Culture adequate volume   Culture   Final    NO GROWTH 1 DAY Performed at [facility], [place] [address], [place], [place] [address]    Report Status PENDING  Incomplete  Blood Culture (routine x 2)     Status: None (Preliminary result)   Collection Time: 01/25/21 11:45 PM   Specimen: BLOOD LEFT WRIST  Result Value Ref Range Status   Specimen Description BLOOD LEFT WRIST  Final   Special Requests   Final    BOTTLES DRAWN AEROBIC AND ANAEROBIC Blood Culture adequate volume   Culture   Final    NO GROWTH 1 DAY Performed at [facility], [place] [address], [place], [place] [address]    Report Status PENDING  Incomplete         Radiology Studies: DG Chest Portable 1 View  Result Date: 01/25/2021 CLINICAL DATA:  Weakness EXAM: PORTABLE CHEST 1 VIEW COMPARISON:  10/30/2020 FINDINGS: Cardiac shadow is prominent but accentuated by the portable technique. Lungs are well aerated bilaterally. Very mild vascular congestion is noted without edema. No infiltrate is seen. No bony abnormality is noted. IMPRESSION: Mild vascular congestion without edema. No other focal abnormality is seen. Electronically Signed   By: Inez Catalina M.D.   On: 01/25/2021 22:27   CT Renal Stone Study  Result Date: 01/26/2021 CLINICAL DATA:  Bilateral flank pain EXAM: CT ABDOMEN AND PELVIS WITHOUT CONTRAST TECHNIQUE: Multidetector CT imaging of the abdomen and pelvis was performed following the standard protocol without IV contrast. COMPARISON:  01/14/2021 FINDINGS: Lower chest: No acute abnormality. Hepatobiliary: No focal liver abnormality is seen. Status post cholecystectomy. No biliary dilatation. Pancreas: Unremarkable. No pancreatic ductal dilatation or surrounding inflammatory changes. Spleen: Normal in size without focal abnormality. Adrenals/Urinary Tract: Adrenal glands are within normal limits. Left kidney again demonstrates staghorn type  calculus which extends into the renal pelvis and towards the UPJ although no obstructive changes are seen. Left ureter is unremarkable. Right kidney demonstrates multiple nonobstructing renal calculi similar to that seen on prior exam. Large hypodense lesion is noted in the upper pole of the right kidney measuring up to 8.6 cm similar to that seen on the prior exam in dating back to  2019. No other mass is seen. The right ureter is unremarkable. Bladder is decompressed. Some persistent bladder wall thickening is noted which may be related underlying infection. Stomach/Bowel: Colon shows no obstructive or inflammatory changes. The appendix is within normal limits. Small bowel and stomach are unremarkable. Vascular/Lymphatic: No significant vascular findings are present. No enlarged abdominal or pelvic lymph nodes. Reproductive: Uterus and bilateral adnexa are unremarkable. Other: No abdominal wall hernia or abnormality. No abdominopelvic ascites. Musculoskeletal: Degenerative changes of lumbar spine are again seen. IMPRESSION: Stable bilateral renal calculi.  No obstructive changes are seen. Stable hypodense lesion in the upper pole of the right kidney dating back to 2019 consistent with a complex cyst. Persistent bladder wall thickening which may represent an underlying UTI. Correlate with laboratory values. Electronically Signed   By: Inez Catalina M.D.   On: 01/26/2021 02:12        Scheduled Meds:  buPROPion  300 mg Oral Daily   docusate sodium  100 mg Oral BID   enoxaparin (LOVENOX) injection  0.5 mg/kg Subcutaneous Q24H   fluticasone  2 spray Each Nare BID   levofloxacin  500 mg Oral Q24H   methocarbamol  500 mg Oral QID   mometasone-formoterol  2 puff Inhalation BID   montelukast  10 mg Oral QHS   pregabalin  100 mg Oral TID   rOPINIRole  4 mg Oral QHS   senna-docusate  2 tablet Oral Daily   topiramate  25 mg Oral BID   traZODone  50 mg Oral QHS   ziprasidone  20 mg Oral Daily   Continuous  Infusions:  lactated ringers 75 mL/hr at 01/27/21 0941     LOS: 1 day        Hosie Poisson, MD [facility]   To contact the attending provider between 7A-7P or the covering provider during after hours 7P-7A, please log into the web site [email or web address] and access using universal [place] password for that web site. If you do not have the password, please call the hospital operator.  01/27/2021, 4:37 PM

## 2021-01-27 NOTE — Plan of Care (Signed)
  Problem: Health Behavior/Discharge Planning: Goal: Ability to manage health-related needs will improve Outcome: Progressing   Problem: Clinical Measurements: Goal: Ability to maintain clinical measurements within normal limits will improve Outcome: Progressing   Problem: Safety: Goal: Ability to remain free from injury will improve Outcome: Progressing   

## 2021-01-28 ENCOUNTER — Inpatient Hospital Stay (HOSPITAL_COMMUNITY): Payer: 59

## 2021-01-28 MED ORDER — FOSFOMYCIN TROMETHAMINE 3 G PO PACK
3.0000 g | PACK | Freq: Once | ORAL | Status: AC
  Administered 2021-01-28: 3 g via ORAL
  Filled 2021-01-28: qty 3

## 2021-01-28 NOTE — Evaluation (Signed)
Physical Therapy Evaluation Patient Details Name: Heather [name] MRN: [id number] DOB: 11/23/1975 Today's Date: 01/28/2021  History of Present Illness  Pt is a 46 y.o. F who presents 01/25/2021 with a UTI. Significant PMH: COPD, anxiety/depression/schizoaffective disorder, DM, morbid obesity.  Clinical Impression  PTA, pt living in a motel, using a walker for mobility, and is independent with ADL's. On PT evaluation, pt ambulating 300 feet with and without a walker at a supervision level. She reports mild right knee pain, which is chronic in nature. Pt demonstrates decreased endurance and mild dynamic balance deficits. SpO2 92% on RA, HR up to 123 bpm with exertion. Will continue to follow acutely to promote mobility while inpatient.     Recommendations for follow up therapy are one component of a multi-disciplinary discharge planning process, led by the attending physician.  Recommendations may be updated based on patient status, additional functional criteria and insurance authorization.  Follow Up Recommendations No PT follow up;Other (comment) (supervision/assist for IADL's)    Equipment Recommendations  None recommended by PT    Recommendations for Other Services       Precautions / Restrictions Precautions Precautions: Fall Restrictions Weight Bearing Restrictions: No      Mobility  Bed Mobility Overal bed mobility: Needs Assistance Bed Mobility: Supine to Sit;Sit to Supine     Supine to sit: Supervision     General bed mobility comments: overall supervision for safety, increased time/effort    Transfers Overall transfer level: Needs assistance Equipment used: Rolling walker (2 wheeled);4-wheeled walker Transfers: Sit to/from Stand Sit to Stand: Min guard         General transfer comment: increased time to power up, use of momentum  Ambulation/Gait Ambulation/Gait assistance: Supervision Gait Distance (Feet): 300 Feet Assistive device: Rolling walker  (2 wheeled);None Gait Pattern/deviations: Step-through pattern;Decreased stride length;Trunk flexed Gait velocity: slowed   General Gait Details: Pt with slow and steady pace, ambulating initial ~250 ft with walker vs ~50 ft without walker intermittently reaching for rail. Pt reporting the "walker goes too fast for her."  Stairs            Wheelchair Mobility    Modified Rankin (Stroke Patients Only)       Balance Overall balance assessment: Needs assistance Sitting-balance support: No upper extremity supported;Feet supported Sitting balance-Leahy Scale: Good     Standing balance support: No upper extremity supported Standing balance-Leahy Scale: Fair                               Pertinent Vitals/Pain Pain Assessment: Faces Faces Pain Scale: Hurts a little bit Pain Location: R knee with long distance ambulation Pain Descriptors / Indicators: Grimacing;Aching Pain Intervention(s): Monitored during session    Home Living Family/patient expects to be discharged to:: Unsure                 Additional Comments: pt living at a motel prior to admission    Prior Function Level of Independence: Needs assistance   Gait / Transfers Assistance Needed: ambulating with walker  ADL's / Homemaking Assistance Needed: pt reports ordering delivery of mainly "TV dinners," as the motel only had a fridge and microwave. Pt independent ADL's        Hand Dominance   Dominant Hand: Left    Extremity/Trunk Assessment   Upper Extremity Assessment Upper Extremity Assessment: Overall WFL for tasks assessed    Lower Extremity Assessment Lower Extremity Assessment: Generalized weakness  Cervical / Trunk Assessment Cervical / Trunk Assessment: Other exceptions Cervical / Trunk Exceptions: increased body habitus  Communication   Communication: No difficulties  Cognition Arousal/Alertness: Awake/alert Behavior During Therapy: WFL for tasks  assessed/performed Overall Cognitive Status: No family/caregiver present to determine baseline cognitive functioning                                 General Comments: Pt pleasant and cooperative, difficulty obtaining clear PLOF      General Comments      Exercises     Assessment/Plan    PT Assessment Patient needs continued PT services  PT Problem List Decreased strength;Decreased range of motion;Decreased activity tolerance;Decreased balance;Obesity;Decreased skin integrity;Decreased mobility;Pain       PT Treatment Interventions DME instruction;Gait training;Functional mobility training;Therapeutic activities;Therapeutic exercise;Balance training;Cognitive remediation;Patient/family education    PT Goals (Current goals can be found in the Care Plan section)  Acute Rehab PT Goals Patient Stated Goal: to get more support PT Goal Formulation: With patient Time For Goal Achievement: 02/11/21 Potential to Achieve Goals: Fair    Frequency Min 3X/week   Barriers to discharge Other (comment) (homeless)      Co-evaluation               AM-PAC PT "6 Clicks" Mobility  Outcome Measure Help needed turning from your back to your side while in a flat bed without using bedrails?: None Help needed moving from lying on your back to sitting on the side of a flat bed without using bedrails?: A Little Help needed moving to and from a bed to a chair (including a wheelchair)?: A Little Help needed standing up from a chair using your arms (e.g., wheelchair or bedside chair)?: A Little Help needed to walk in hospital room?: A Little Help needed climbing 3-5 steps with a railing? : A Lot 6 Click Score: 18    End of Session   Activity Tolerance: Patient tolerated treatment well Patient left: in bed;with call bell/phone within reach Nurse Communication: Mobility status PT Visit Diagnosis: Unsteadiness on feet (R26.81);Muscle weakness (generalized) (M62.81);History of  falling (Z91.81)    Time: 6553-7482 PT Time Calculation (min) (ACUTE ONLY): 34 min   Charges:   PT Evaluation $PT Eval Moderate Complexity: 1 Mod PT Treatments $Therapeutic Activity: 8-22 mins        Wyona Almas, PT, DPT Acute Rehabilitation Services Pager [phone] Office [phone]   Deno Etienne 01/28/2021, 4:36 PM

## 2021-01-28 NOTE — Progress Notes (Signed)
A consult was placed to IV Therapy to restart the pt's iv; pt is no longer receiving any iv fluids or medications, and has a history of being a very difficult stick; have spoken w RN who will talk w the Dr regarding iv access.

## 2021-01-28 NOTE — Progress Notes (Signed)
PROGRESS NOTE    Heather [name]  NFA:[id number] DOB: 1975/05/20 DOA: 01/25/2021 PCP: Lucianne Lei, MD    Chief Complaint  Patient presents with   Weakness   Urinary Frequency   Diarrhea    Brief Narrative: Heather [name] is a 45 y.o. female with medical history significant of COPD; anxiety/depression/schizoaffective d/o; DM; HLD; OSA; and morbid obesity presenting with urinary symptoms.  She was seen on 10/1 at [facility] for mental health issues and she had a +urine culture. She was admitted for UTI.  Urine cultures from 10/1 showed Enterococcus fecalis. Repeat urine cultures show multiple bacteria. She was started on VI vancomycin and transitioned to oral levaquin. We will go ahead and d/c levaquin and transition her to fosfomycin. SHE IS ALSO homeless and does not have a place to go .  She Needs mental health evaluation with PASSR letter, wants to go to nursing facility. TOC will be consulted.  Therapy eval ordered.    Assessment & Plan:   Active Problems:   Essential hypertension   Asthma   RLS (restless legs syndrome)   DM type 2 (diabetes mellitus, type 2) (HCC)   OSA (obstructive sleep apnea)   Class 3 severe obesity with serious comorbidity and body mass index (BMI) greater than or equal to 70 in adult [facility])   CKD (chronic kidney disease), stage III (HCC)   Cystitis   Social problem   Enterococcus faecalis infection   Mood disorder ([place])   Chronic pain disorder   Cystitis:  Sec to E fecalis UTI.  Urine cultures show multiple species She was started on IV vancomycin, then transitioned to levaquin. D/c levaquin, and one dose of fosfomycin.     RLS: Resume requip.    Type 2 DM: CBG (last 3)  No results for input(s): GLUCAP in the last 72 hours. Diet controlled.   Nausea and vomiting:  Abd film ordered.  Prn nausea and vomiting.   OSA on CPAP.    Stage 3 a CKD:  Creatinine improving with IV fluids.    Body mass index is 72.6 kg/m. Morbid  obesity.    ASTHMA:  No wheezing heard.  Resume home meds.    Hypertension:  Well controlled.   Chronic pain syndrome:  -resume home meds.    Anemia of chronic disease:  Hemoglobin stable around 10.    Hypokalemia:  Replaced.     DVT prophylaxis: lovenox.  Code Status: Full code.  Family Communication: none at bedside.  Disposition:   Status is: Inpatient  Remains inpatient appropriate because: of unsafe d/c plan.        Consultants:  Urology.   Procedures: none.   Antimicrobials:  Antibiotics Given (last 72 hours)     Date/Time Action Medication Dose Rate   01/26/21 0220 New Bag/Given   cefTRIAXone (ROCEPHIN) 1 g in sodium chloride 0.9 % 100 mL IVPB 1 g 200 mL/hr   01/26/21 1122 New Bag/Given   vancomycin (VANCOCIN) 2,500 mg in sodium chloride 0.9 % 500 mL IVPB 2,500 mg 166.7 mL/hr   01/26/21 2217 Given   levofloxacin (LEVAQUIN) tablet 500 mg 500 mg    01/27/21 2144 Given   levofloxacin (LEVAQUIN) tablet 500 mg 500 mg          Subjective: Pt reports nauseated, and vomited once this am.    Objective: Vitals:   01/27/21 2137 01/28/21 0406 01/28/21 0822 01/28/21 0934  BP: 134/79 102/63  111/74  Pulse: 85 72  79  Resp: 19 16  18  Temp: 98.2 F (36.8 C) 97.8 F (36.6 C)  98.7 F (37.1 C)  TempSrc:  Oral  Oral  SpO2: 96% 100% 95% 98%  Weight:      Height:        Intake/Output Summary (Last 24 hours) at 01/28/2021 1509 Last data filed at 01/28/2021 0254 Gross per 24 hour  Intake 240 ml  Output 300 ml  Net -60 ml    Filed Weights   01/26/21 0800 01/26/21 1435 01/27/21 0502  Weight: (!) 197.8 kg (!) 186.2 kg (!) 185.9 kg    Examination:  General exam: Morbidly obese lady, not in distress.  Respiratory system: Clear to auscultation. Respiratory effort normal. Cardiovascular system: S1 & S2 heard, RRR. No JVD,  Gastrointestinal system: Abdomen is nondistended, soft and nontender. . Normal bowel sounds heard. Central nervous  system: Alert and oriented. No focal neurological deficits. Extremities: Symmetric 5 x 5 power. Skin: No rashes, lesions or ulcers Psychiatry: Mood & affect appropriate.      Data Reviewed: I have personally reviewed following labs and imaging studies  CBC: Recent Labs  Lab 01/25/21 2143 01/27/21 0417  WBC 11.1* 8.4  NEUTROABS 7.3  --   HGB 11.4* 10.6*  HCT 37.9 35.2*  MCV 85.6 84.6  PLT 362 318     Basic Metabolic Panel: Recent Labs  Lab 01/25/21 2143 01/27/21 0417  NA 137 137  K 3.6 3.2*  CL 104 107  CO2 22 24  GLUCOSE 98 88  BUN 15 11  CREATININE 1.32* 1.11*  CALCIUM 9.4 8.8*     GFR: Estimated Creatinine Clearance: 109.1 mL/min (A) (by C-G formula based on SCr of 1.11 mg/dL (H)).  Liver Function Tests: Recent Labs  Lab 01/25/21 2143  AST 27  ALT 22  ALKPHOS 61  BILITOT 0.6  PROT 7.0  ALBUMIN 3.3*     CBG: No results for input(s): GLUCAP in the last 168 hours.   Recent Results (from the past 240 hour(s))  Urine Culture     Status: Abnormal   Collection Time: 01/25/21 10:06 PM   Specimen: Urine, Clean Catch  Result Value Ref Range Status   Specimen Description URINE, CLEAN CATCH  Final   Special Requests   Final    NONE Performed at [facility], [address] [place], [place] [address]    Culture MULTIPLE SPECIES PRESENT, SUGGEST RECOLLECTION (A)  Final   Report Status 01/27/2021 FINAL  Final  Resp Panel by RT-PCR (Flu A&B, Covid) Nasopharyngeal Swab     Status: None   Collection Time: 01/25/21 11:03 PM   Specimen: Nasopharyngeal Swab; Nasopharyngeal(NP) swabs in vial transport medium  Result Value Ref Range Status   SARS Coronavirus 2 by RT PCR NEGATIVE NEGATIVE Final    Comment: (NOTE) SARS-CoV-2 target nucleic acids are NOT DETECTED.  The SARS-CoV-2 RNA is generally detectable in upper respiratory specimens during the acute phase of infection. The lowest concentration of SARS-CoV-2 viral copies this assay can detect  is 138 copies/mL. A negative result does not preclude SARS-Cov-2 infection and should not be used as the sole basis for treatment or other patient management decisions. A negative result may occur with  improper specimen collection/handling, submission of specimen other than nasopharyngeal swab, presence of viral mutation(s) within the areas targeted by this assay, and inadequate number of viral copies(<138 copies/mL). A negative result must be combined with clinical observations, patient history, and epidemiological information. The expected result is Negative.  Fact Sheet for  Patients:  EntrepreneurPulse.com.au  Fact Sheet for Healthcare Providers:  IncredibleEmployment.be  This test is no t yet approved or cleared by the [place] FDA and  has been authorized for detection and/or diagnosis of SARS-CoV-2 by FDA under an Emergency Use Authorization (EUA). This EUA will remain  in effect (meaning this test can be used) for the duration of the COVID-19 declaration under Section 564(b)(1) of the Act, 21 U.S.C.section 360bbb-3(b)(1), unless the authorization is terminated  or revoked sooner.       Influenza A by PCR NEGATIVE NEGATIVE Final   Influenza B by PCR NEGATIVE NEGATIVE Final    Comment: (NOTE) The Xpert Xpress SARS-CoV-2/FLU/RSV plus assay is intended as an aid in the diagnosis of influenza from Nasopharyngeal swab specimens and should not be used as a sole basis for treatment. Nasal washings and aspirates are unacceptable for Xpert Xpress SARS-CoV-2/FLU/RSV testing.  Fact Sheet for Patients: EntrepreneurPulse.com.au  Fact Sheet for Healthcare Providers: IncredibleEmployment.be  This test is not yet approved or cleared by the [place] FDA and has been authorized for detection and/or diagnosis of SARS-CoV-2 by FDA under an Emergency Use Authorization (EUA). This EUA will remain in effect  (meaning this test can be used) for the duration of the COVID-19 declaration under Section 564(b)(1) of the Act, 21 U.S.C. section 360bbb-3(b)(1), unless the authorization is terminated or revoked.  Performed at [facility], [place] [address], [place], [place] [address]   Blood Culture (routine x 2)     Status: None (Preliminary result)   Collection Time: 01/25/21 11:45 PM   Specimen: BLOOD RIGHT HAND  Result Value Ref Range Status   Specimen Description BLOOD RIGHT HAND  Final   Special Requests   Final    BOTTLES DRAWN AEROBIC AND ANAEROBIC Blood Culture adequate volume   Culture   Final    NO GROWTH 2 DAYS Performed at [facility], [place] [address], [place], [place] [address]    Report Status PENDING  Incomplete  Blood Culture (routine x 2)     Status: None (Preliminary result)   Collection Time: 01/25/21 11:45 PM   Specimen: BLOOD LEFT WRIST  Result Value Ref Range Status   Specimen Description BLOOD LEFT WRIST  Final   Special Requests   Final    BOTTLES DRAWN AEROBIC AND ANAEROBIC Blood Culture adequate volume   Culture   Final    NO GROWTH 2 DAYS Performed at [facility], [place] [address], [place], [place] [address]    Report Status PENDING  Incomplete          Radiology Studies: No results found.      Scheduled Meds:  buPROPion  300 mg Oral Daily   docusate sodium  100 mg Oral BID   enoxaparin (LOVENOX) injection  0.5 mg/kg Subcutaneous Q24H   fluticasone  2 spray Each Nare BID   fosfomycin  3 g Oral Once   methocarbamol  500 mg Oral QID   mometasone-formoterol  2 puff Inhalation BID   montelukast  10 mg Oral QHS   pregabalin  100 mg Oral TID   rOPINIRole  4 mg Oral QHS   senna-docusate  2 tablet Oral Daily   topiramate  25 mg Oral BID   traZODone  50 mg Oral QHS   ziprasidone  20 mg Oral Daily   Continuous Infusions:     LOS: 2 days        Hosie Poisson, MD [facility]  To contact the attending provider between  7A-7P or the covering provider during after hours 7P-7A, please log into the web site [email or web address] and access using universal [place] password for that web site. If you do not have the password, please call the hospital operator.  01/28/2021, 3:09 PM

## 2021-01-28 NOTE — Progress Notes (Signed)
Patient's peripheral IV infiltrated and currently she has no IV MD is aware.

## 2021-01-29 ENCOUNTER — Telehealth (INDEPENDENT_AMBULATORY_CARE_PROVIDER_SITE_OTHER): Payer: Medicaid Other | Admitting: Psychiatry

## 2021-01-29 ENCOUNTER — Encounter (HOSPITAL_COMMUNITY): Payer: Self-pay

## 2021-01-29 DIAGNOSIS — F4310 Post-traumatic stress disorder, unspecified: Secondary | ICD-10-CM

## 2021-01-29 DIAGNOSIS — F331 Major depressive disorder, recurrent, moderate: Secondary | ICD-10-CM

## 2021-01-29 DIAGNOSIS — J45909 Unspecified asthma, uncomplicated: Secondary | ICD-10-CM

## 2021-01-29 LAB — BASIC METABOLIC PANEL
Anion gap: 8 (ref 5–15)
BUN: 14 mg/dL (ref 6–20)
CO2: 25 mmol/L (ref 22–32)
Calcium: 8.9 mg/dL (ref 8.9–10.3)
Chloride: 106 mmol/L (ref 98–111)
Creatinine, Ser: 1.28 mg/dL — ABNORMAL HIGH (ref 0.44–1.00)
GFR, Estimated: 53 mL/min — ABNORMAL LOW (ref >60)
Glucose, Bld: 98 mg/dL (ref 70–99)
Potassium: 3.6 mmol/L (ref 3.5–5.1)
Sodium: 139 mmol/L (ref 135–145)

## 2021-01-29 MED ORDER — ZIPRASIDONE HCL 20 MG PO CAPS: 20.0000 mg | ORAL_CAPSULE | Freq: Every day | ORAL | 3 refills | Status: DC

## 2021-01-29 MED ORDER — TRAZODONE HCL 50 MG PO TABS: 50.0000 mg | ORAL_TABLET | Freq: Every day | ORAL | 2 refills | Status: DC

## 2021-01-29 MED ORDER — BUPROPION HCL ER (XL) 300 MG PO TB24: 300.0000 mg | ORAL_TABLET | Freq: Every day | ORAL | 3 refills | Status: DC

## 2021-01-29 NOTE — TOC Initial Note (Signed)
Transition of Care [facility]) - Initial/Assessment Note    Patient Details  Name: Heather [name] MRN: [id number] Date of Birth: 01-Aug-1975  Transition of [facility]) CM/SW Contact:    Heather Feil, LCSW Phone Number: 01/29/2021, 6:08 PM  Clinical Narrative: Talked with patient at bedside regarding SNF placement. Patient is homeless and stated that she has a hard time managing her medications,  has been having a hard time going to the bathroom like she should, her legs are weak, she has no where to go and has no help from family and these things discussed. [name] advised that a facility search will be initiated and patient made aware that she may not have a choice on where she can go.  CSW checked prior admission and patient's PASRR has expired. It was in effect from 11/16/20 - 12/16/20. CSW will submit for a new PASRR number.                   Expected Discharge Plan: Long Term Nursing Home Barriers to Discharge: Other (must enter comment) (Will need new PASRR number, faclity search has to be initiated for LTC bed ar nursing facility.)   Patient Goals and CMS Choice Patient states their goals for this hospitalization and ongoing recovery are:: Patient has no income and no where to live CMS Medicare.gov Compare Post Acute Care list provided to::  (Will be provided to patient based on bed offers received)    Expected Discharge Plan and Services Expected Discharge Plan: Long Term Nursing Home In-house Referral: Clinical Social Work   Post Acute Care Choice: Nursing Home (Patient needs LTC bed) Living arrangements for the past 2 months: Homeless                                      Prior Living Arrangements/Services Living arrangements for the past 2 months: Homeless Lives with:: Self Patient language and need for interpreter reviewed:: No Do you feel safe going back to the place where you live?:  (Patient is homeless)   Patient is homeless  Need for Family  Participation in Patient Care: No (Comment) Care giver support system in place?: No (comment)   Criminal Activity/Legal Involvement Pertinent to Current Situation/Hospitalization: No - Comment as needed  Activities of Daily Living      Permission Sought/Granted Permission sought to share information with :  (patient agreeable to CSW talkig with nursing facilities admissions staff) Permission granted to share information with :  (n/a)              Emotional Assessment Appearance:: Appears stated age Attitude/Demeanor/Rapport: Engaged Affect (typically observed): Appropriate Orientation: : Oriented to Self, Oriented to Place, Oriented to  Time, Oriented to Situation Alcohol / Substance Use: Tobacco Use, Alcohol Use, Illicit Drugs (Patient quit smoking and does not drink or use illicit drugs) Psych Involvement: No (comment)  Admission diagnosis:  Pyelonephritis [N12] Enterococcus faecalis infection [B95.2] Patient Active Problem List   Diagnosis Date Noted   Cystitis 01/26/2021   Social problem 01/26/2021   Enterococcus faecalis infection 01/26/2021   Mood disorder ([place]) 01/26/2021   Chronic pain disorder 01/26/2021   Thrombocytosis 10/31/2020   CKD (chronic kidney disease), stage III ([place]) 10/31/2020   Major depressive disorder, recurrent ([place]) 09/13/2020   Insomnia    Anemia of chronic disease    Chronic bilateral low back pain without sciatica    Debility 08/24/2020  SIRS (systemic inflammatory response syndrome) ([place]) 08/18/2020   Pressure injury of skin 08/11/2020   Rhabdomyolysis 08/10/2020   Acute renal failure ([place]) 08/10/2020   PTSD (post-traumatic stress disorder) 05/15/2020   Major depressive disorder, recurrent episode, moderate ([place]) 05/05/2020   Generalized anxiety disorder 05/05/2020   Blood in stool    Gastritis and gastroduodenitis    Benign neoplasm of sigmoid colon    Class 3 severe obesity with serious comorbidity and body mass index (BMI) greater  than or equal to 70 in adult [facility]) 06/16/2018   Nexplanon insertion 03/27/2017   Postpartum hypertension 03/05/2017   Perennial allergic rhinitis 11/05/2016   Mild persistent asthma with acute exacerbation 11/05/2016   Hypoglycemia 11/07/2015   OSA (obstructive sleep apnea) 11/07/2015   DM type 2 (diabetes mellitus, type 2) ([place]) 12/07/2014   Panniculitis 12/06/2014   Cellulitis, abdominal wall 11/11/2014   Abdominal pain 47/82/9562   Eosinophilic esophagitis 13/11/6576   Change in bowel habits 04/28/2013   RLS (restless legs syndrome) 08/08/2011   Adjustment disorder 46/96/2952   DYSMETABOLIC SYNDROME 84/13/2440   Essential hypertension 05/12/2006   Asthma 05/12/2006   OSTEOARTHRITIS 05/12/2006   PCP:  Lucianne Lei, MD Pharmacy:   [facility] [place], [place] [address] [place] [address] Phone: [phone] Fax: [phone]  [facility] [address] [place] [address] Phone: [phone] Fax: [phone]     Social Determinants of Health (SDOH) Interventions  Patient is homeless, morbidly obese and has mental health issues  Readmission Risk Interventions Readmission Risk Prevention Plan 12/08/2020  Transportation Screening Complete  Medication Review Press photographer) Complete  PCP or Specialist appointment within 3-5 days of discharge Complete  HRI or [place] Complete  SW Recovery Care/Counseling Consult Complete  [place] Not Applicable  Some recent data might be hidden

## 2021-01-29 NOTE — Progress Notes (Signed)
PROGRESS NOTE    Heather [name]  TGG:[id number] DOB: 1975-11-12 DOA: 01/25/2021 PCP: Lucianne Lei, MD    Chief Complaint  Patient presents with   Weakness   Urinary Frequency   Diarrhea    Brief Narrative: Heather [name] is a 45 y.o. female with medical history significant of COPD; anxiety/depression/schizoaffective d/o; DM; HLD; OSA; and morbid obesity presenting with urinary symptoms.  She was seen on 10/1 at [facility] for mental health issues and she had a +urine culture. She was admitted for UTI.  Urine cultures from 10/1 showed Enterococcus fecalis. Repeat urine cultures show multiple bacteria. She was started on VI vancomycin and transitioned to oral levaquin. We will go ahead and d/c levaquin and transition her to fosfomycin. SHE IS ALSO homeless and does not have a place to go .  TOC will be consulted.  Therapy eval ordered recommended no PT follow up.    Assessment & Plan:   Active Problems:   Essential hypertension   Asthma   RLS (restless legs syndrome)   DM type 2 (diabetes mellitus, type 2) (HCC)   OSA (obstructive sleep apnea)   Class 3 severe obesity with serious comorbidity and body mass index (BMI) greater than or equal to 70 in adult [facility])   CKD (chronic kidney disease), stage III (HCC)   Cystitis   Social problem   Enterococcus faecalis infection   Mood disorder ([place])   Chronic pain disorder   Cystitis:  Sec to E fecalis UTI. Completed 2 gm of IV vancomycin , followed by levaquin.  She was also given a dose of fosfomycin.  Repeat Urine cultures show multiple species She was started on IV vancomycin, then transitioned to levaquin. D/c levaquin, and one dose of fosfomycin.     RLS: Resume requip.    Type 2 DM: CBG (last 3)  No results for input(s): GLUCAP in the last 72 hours. Diet controlled.   Nausea and vomiting:  Resolved.  Abd film unremarkable,   OSA on CPAP.    Stage 3 a CKD:  Creatinine at baseline.    Body mass index is  72.6 kg/m. Morbid obesity.    ASTHMA:  No wheezing heard.  Resume home meds.    Hypertension:  Well controlled.   Chronic pain syndrome:  -resume home meds.    Anemia of chronic disease:  Hemoglobin stable around 10.    Hypokalemia:  Replaced.     DVT prophylaxis: lovenox.  Code Status: Full code.  Family Communication: none at bedside.  Disposition:   Status is: Inpatient  Remains inpatient appropriate because: pt is medically stable for discharge.        Consultants:  None.    Procedures: none.   Antimicrobials:  Antibiotics Given (last 72 hours)     Date/Time Action Medication Dose   01/26/21 2217 Given   levofloxacin (LEVAQUIN) tablet 500 mg 500 mg   01/27/21 2144 Given   levofloxacin (LEVAQUIN) tablet 500 mg 500 mg   01/28/21 1737 Given   fosfomycin (MONUROL) packet 3 g 3 g         Subjective: Pt reports nauseated, and vomited once this am.    Objective: Vitals:   01/28/21 0934 01/28/21 2116 01/29/21 0545 01/29/21 0949  BP: 111/74 124/65 113/63 (!) 107/48  Pulse: 79 88 71 79  Resp: 18 18 18 18   Temp: 98.7 F (37.1 C) 98.7 F (37.1 C) 98.1 F (36.7 C) 98.9 F (37.2 C)  TempSrc: Oral Oral Oral Oral  SpO2: 98% 97% 100% 99%  Weight:      Height:        Intake/Output Summary (Last 24 hours) at 01/29/2021 1257 Last data filed at 01/29/2021 0900 Gross per 24 hour  Intake 800 ml  Output 301 ml  Net 499 ml    Filed Weights   01/26/21 0800 01/26/21 1435 01/27/21 0502  Weight: (!) 197.8 kg (!) 186.2 kg (!) 185.9 kg    Examination:  General exam: Morbidly obese lady not in distress.  Respiratory system: Clear to auscultation. Respiratory effort normal. Cardiovascular system: S1 & S2 heard, RRR. No JVD, Gastrointestinal system: Abdomen is nondistended, soft and nontender. Normal bowel sounds heard. Central nervous system: Alert and oriented. No focal neurological deficits. Extremities: Symmetric 5 x 5 power. Skin: No  rashes, lesions or ulcers Psychiatry: Mood & affect appropriate.       Data Reviewed: I have personally reviewed following labs and imaging studies  CBC: Recent Labs  Lab 01/25/21 2143 01/27/21 0417  WBC 11.1* 8.4  NEUTROABS 7.3  --   HGB 11.4* 10.6*  HCT 37.9 35.2*  MCV 85.6 84.6  PLT 362 318     Basic Metabolic Panel: Recent Labs  Lab 01/25/21 2143 01/27/21 0417 01/29/21 0443  NA 137 137 139  K 3.6 3.2* 3.6  CL 104 107 106  CO2 22 24 25   GLUCOSE 98 88 98  BUN 15 11 14   CREATININE 1.32* 1.11* 1.28*  CALCIUM 9.4 8.8* 8.9     GFR: Estimated Creatinine Clearance: 94.7 mL/min (A) (by C-G formula based on SCr of 1.28 mg/dL (H)).  Liver Function Tests: Recent Labs  Lab 01/25/21 2143  AST 27  ALT 22  ALKPHOS 61  BILITOT 0.6  PROT 7.0  ALBUMIN 3.3*     CBG: No results for input(s): GLUCAP in the last 168 hours.   Recent Results (from the past 240 hour(s))  Urine Culture     Status: Abnormal   Collection Time: 01/25/21 10:06 PM   Specimen: Urine, Clean Catch  Result Value Ref Range Status   Specimen Description URINE, CLEAN CATCH  Final   Special Requests   Final    NONE Performed at [facility], [address] [place], [place] [address]    Culture MULTIPLE SPECIES PRESENT, SUGGEST RECOLLECTION (A)  Final   Report Status 01/27/2021 FINAL  Final  Resp Panel by RT-PCR (Flu A&B, Covid) Nasopharyngeal Swab     Status: None   Collection Time: 01/25/21 11:03 PM   Specimen: Nasopharyngeal Swab; Nasopharyngeal(NP) swabs in vial transport medium  Result Value Ref Range Status   SARS Coronavirus 2 by RT PCR NEGATIVE NEGATIVE Final    Comment: (NOTE) SARS-CoV-2 target nucleic acids are NOT DETECTED.  The SARS-CoV-2 RNA is generally detectable in upper respiratory specimens during the acute phase of infection. The lowest concentration of SARS-CoV-2 viral copies this assay can detect is 138 copies/mL. A negative result does not preclude  SARS-Cov-2 infection and should not be used as the sole basis for treatment or other patient management decisions. A negative result may occur with  improper specimen collection/handling, submission of specimen other than nasopharyngeal swab, presence of viral mutation(s) within the areas targeted by this assay, and inadequate number of viral copies(<138 copies/mL). A negative result must be combined with clinical observations, patient history, and epidemiological information. The expected result is Negative.  Fact Sheet for Patients:  EntrepreneurPulse.com.au  Fact Sheet for Healthcare Providers:  IncredibleEmployment.be  This test  is no t yet approved or cleared by the [place] and  has been authorized for detection and/or diagnosis of SARS-CoV-2 by FDA under an Emergency Use Authorization (EUA). This EUA will remain  in effect (meaning this test can be used) for the duration of the COVID-19 declaration under Section 564(b)(1) of the Act, 21 U.S.C.section 360bbb-3(b)(1), unless the authorization is terminated  or revoked sooner.       Influenza A by PCR NEGATIVE NEGATIVE Final   Influenza B by PCR NEGATIVE NEGATIVE Final    Comment: (NOTE) The Xpert Xpress SARS-CoV-2/FLU/RSV plus assay is intended as an aid in the diagnosis of influenza from Nasopharyngeal swab specimens and should not be used as a sole basis for treatment. Nasal washings and aspirates are unacceptable for Xpert Xpress SARS-CoV-2/FLU/RSV testing.  Fact Sheet for Patients: EntrepreneurPulse.com.au  Fact Sheet for Healthcare Providers: IncredibleEmployment.be  This test is not yet approved or cleared by the [place] FDA and has been authorized for detection and/or diagnosis of SARS-CoV-2 by FDA under an Emergency Use Authorization (EUA). This EUA will remain in effect (meaning this test can be used) for the duration of  the COVID-19 declaration under Section 564(b)(1) of the Act, 21 U.S.C. section 360bbb-3(b)(1), unless the authorization is terminated or revoked.  Performed at [facility], [place] [address] [place], [place] [address]   Blood Culture (routine x 2)     Status: None (Preliminary result)   Collection Time: 01/25/21 11:45 PM   Specimen: BLOOD RIGHT HAND  Result Value Ref Range Status   Specimen Description BLOOD RIGHT HAND  Final   Special Requests   Final    BOTTLES DRAWN AEROBIC AND ANAEROBIC Blood Culture adequate volume   Culture   Final    NO GROWTH 3 DAYS Performed at [facility], [place] [address], [place], [place] [address]    Report Status PENDING  Incomplete  Blood Culture (routine x 2)     Status: None (Preliminary result)   Collection Time: 01/25/21 11:45 PM   Specimen: BLOOD LEFT WRIST  Result Value Ref Range Status   Specimen Description BLOOD LEFT WRIST  Final   Special Requests   Final    BOTTLES DRAWN AEROBIC AND ANAEROBIC Blood Culture adequate volume   Culture   Final    NO GROWTH 3 DAYS Performed at [facility], [place] [address], [place], [place] [address]    Report Status PENDING  Incomplete          Radiology Studies: DG Abd 1 View  Result Date: 01/28/2021 CLINICAL DATA:  Nausea and vomiting. EXAM: ABDOMEN - 1 VIEW COMPARISON:  January 26, 2021. FINDINGS: Suboptimal evaluation due to body habitus and incomplete assessment lateral soft tissues. Air and stool filled nondilated loops of bowel. Surgical clips project over the upper RIGHT abdomen. Degenerative changes of the lumbar spine. Revisualization of LEFT staghorn calculus, better assessed on recent CT. Multiple RIGHT-sided nephrolithiasis. Visualized lung bases are unremarkable. IMPRESSION: Nonobstructive bowel gas pattern. Electronically Signed   By: Valentino Saxon M.D.   On: 01/28/2021 16:52        Scheduled Meds:  buPROPion  300 mg Oral Daily   docusate sodium  100 mg Oral  BID   enoxaparin (LOVENOX) injection  0.5 mg/kg Subcutaneous Q24H   fluticasone  2 spray Each Nare BID   methocarbamol  500 mg Oral QID   mometasone-formoterol  2 puff Inhalation BID   montelukast  10 mg Oral  QHS   pregabalin  100 mg Oral TID   rOPINIRole  4 mg Oral QHS   senna-docusate  2 tablet Oral Daily   topiramate  25 mg Oral BID   traZODone  50 mg Oral QHS   ziprasidone  20 mg Oral Daily   Continuous Infusions:     LOS: 3 days        Hosie Poisson, MD [facility]   To contact the attending provider between 7A-7P or the covering provider during after hours 7P-7A, please log into the web site [email or web address] and access using universal [place] password for that web site. If you do not have the password, please call the hospital operator.  01/29/2021, 12:57 PM
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
## 2021-01-30 NOTE — Progress Notes (Signed)
Physical Therapy Treatment Patient Details Name: Heather [name] MRN: [id number] DOB: [birth year] Today's Date: 01/30/2021   History of Present Illness Pt is a 45 y.o. F who presents 01/25/2021 with a UTI. Significant PMH: COPD, anxiety/depression/schizoaffective disorder, DM, morbid obesity.    PT Comments    Pt maintaining level of functional mobility. Ambulating 200 feet with a walker at a supervision level. Reports right knee numbness towards end of walk. Continued to encourage performing strengthening exercises throughout the day.     Recommendations for follow up therapy are one component of a multi-disciplinary discharge planning process, led by the attending physician.  Recommendations may be updated based on patient status, additional functional criteria and insurance authorization.  Follow Up Recommendations  No PT follow up     Assistance Recommended at Discharge Intermittent Supervision/Assistance  Equipment Recommendations  None recommended by PT    Recommendations for Other Services       Precautions / Restrictions Precautions Precautions: Fall Restrictions Weight Bearing Restrictions: No     Mobility  Bed Mobility Overal bed mobility: Needs Assistance Bed Mobility: Supine to Sit;Sit to Supine     Supine to sit: Supervision     General bed mobility comments: overall supervision for safety, increased time/effort    Transfers Overall transfer level: Needs assistance   Transfers: Sit to/from Stand Sit to Stand: Min guard           General transfer comment: increased time to power up, use of momentum    Ambulation/Gait Ambulation/Gait assistance: Supervision Gait Distance (Feet): 200 Feet Assistive device: Rolling walker (2 wheels) Gait Pattern/deviations: Step-through pattern;Decreased stride length;Trunk flexed Gait velocity: slowed   General Gait Details: Pt with slow and steady pace, supervision overall for safety   Stairs              Wheelchair Mobility    Modified Rankin (Stroke Patients Only)       Balance Overall balance assessment: Needs assistance Sitting-balance support: No upper extremity supported;Feet supported Sitting balance-Leahy Scale: Good     Standing balance support: No upper extremity supported Standing balance-Leahy Scale: Fair                              Cognition Arousal/Alertness: Awake/alert Behavior During Therapy: WFL for tasks assessed/performed Overall Cognitive Status: No family/caregiver present to determine baseline cognitive functioning                                 General Comments: Pt pleasant and cooperative        Exercises General Exercises - Lower Extremity Long Arc Quad: Both;10 reps;Seated    General Comments        Pertinent Vitals/Pain Pain Assessment: Faces Faces Pain Scale: Hurts little more Pain Location: R knee with long distance ambulation Pain Descriptors / Indicators: Grimacing;Aching;Numbness Pain Intervention(s): Monitored during session    Home Living                          Prior Function            PT Goals (current goals can now be found in the care plan section) Acute Rehab PT Goals Patient Stated Goal: to get more support PT Goal Formulation: With patient Time For Goal Achievement: 02/11/21 Potential to Achieve Goals: Fair Progress towards PT goals: Progressing toward goals  Frequency    Min 3X/week      PT Plan Current plan remains appropriate    Co-evaluation              AM-PAC PT "6 Clicks" Mobility   Outcome Measure  Help needed turning from your back to your side while in a flat bed without using bedrails?: None Help needed moving from lying on your back to sitting on the side of a flat bed without using bedrails?: A Little Help needed moving to and from a bed to a chair (including a wheelchair)?: A Little Help needed standing up from a chair using your  arms (e.g., wheelchair or bedside chair)?: A Little Help needed to walk in hospital room?: A Little Help needed climbing 3-5 steps with a railing? : A Lot 6 Click Score: 18    End of Session   Activity Tolerance: Patient tolerated treatment well Patient left: in bed;with call bell/phone within reach Nurse Communication: Mobility status PT Visit Diagnosis: Unsteadiness on feet (R26.81);Muscle weakness (generalized) (M62.81);History of falling (Z91.81)     Time: 6553-7482 PT Time Calculation (min) (ACUTE ONLY): 30 min  Charges:  $Therapeutic Activity: 23-37 mins                     Wyona Almas, PT, DPT Acute Rehabilitation Services Pager [phone] Office [phone]    Deno Etienne 01/30/2021, 5:10 PM

## 2021-01-30 NOTE — Progress Notes (Signed)
PROGRESS NOTE    Heather [name]  GDJ:[id number] DOB: [birth year] DOA: 01/25/2021 PCP: Lucianne Lei, MD    Chief Complaint  Patient presents with   Weakness   Urinary Frequency   Diarrhea    Brief Narrative: Heather [name] is a 45 y.o. female with medical history significant of COPD; anxiety/depression/schizoaffective d/o; DM; HLD; OSA; and morbid obesity presenting with urinary symptoms.  She was seen on 10/1 at [facility] for mental health issues and she had a +urine culture. She was admitted for UTI.  Urine cultures from 10/1 showed Enterococcus fecalis. Repeat urine cultures show multiple bacteria. She was started on IV vancomycin and transitioned to oral levaquin. We will go ahead d/c levaquin and transition her to fosfomycin. She reports she is homeless and does not have a place to go. Toc on board and assisting with placement.  Therapy eval ordered recommended no PT follow up.    Assessment & Plan:   Active Problems:   Essential hypertension   Asthma   RLS (restless legs syndrome)   DM type 2 (diabetes mellitus, type 2) (HCC)   OSA (obstructive sleep apnea)   Class 3 severe obesity with serious comorbidity and body mass index (BMI) greater than or equal to 70 in adult [facility])   CKD (chronic kidney disease), stage III (HCC)   Cystitis   Social problem   Enterococcus faecalis infection   Mood disorder ([place])   Chronic pain disorder   Cystitis:  Sec to E fecalis UTI. Completed 2 gm of IV vancomycin on admission , followed by levaquin.  She was also given a dose of fosfomycin.  Repeat Urine cultures show multiple species.  Currently she is not complaining of any urinary symptoms.  Blood cultures have been negative.   RLS: Resume requip.    Type 2 DM: CBG (last 3)  No results for input(s): GLUCAP in the last 72 hours. Diet controlled.   Nausea and vomiting:  Resolved.  Abd film unremarkable,   OSA on CPAP.    Stage 3 a CKD:  Creatinine at baseline around 1.2  to 1.3   Body mass index is 72.6 kg/m. Morbid obesity.  Recommend outpatient follow up with PCP for weight loss and healthy lifestyle changes.    Asthma No wheezing heard.  Resume home meds.    Hypertension:  BP parameters are optimal.   Chronic pain syndrome:  -resume home meds.    Anemia of chronic disease:  Hemoglobin stable around 10.    Hypokalemia:  Replaced.   Stable hypodense lesion in the upper pole of the right kidney dating back to 2019 consistent with a complex cyst. Recommend outpatient follow up with Urologist as needed.       DVT prophylaxis: lovenox.  Code Status: Full code.  Family Communication: none at bedside.  Disposition:   Status is: Inpatient  Remains inpatient appropriate because: pt is medically stable for discharge.        Consultants:  None.    Procedures: none.   Antimicrobials:  Antibiotics Given (last 72 hours)     Date/Time Action Medication Dose   01/27/21 2144 Given   levofloxacin (LEVAQUIN) tablet 500 mg 500 mg   01/28/21 1737 Given   fosfomycin (MONUROL) packet 3 g 3 g         Subjective: No chest pain or sob.  No nausea, vomiting or abd pain.    Objective: Vitals:   01/29/21 1955 01/29/21 2040 01/30/21 0511 01/30/21 0807  BP:  117/66 Marland Kitchen)  123/57 (!) 110/54  Pulse:  83 67 78  Resp:  18 18 18   Temp:  98.7 F (37.1 C) 98 F (36.7 C) 98.6 F (37 C)  TempSrc:  Oral Oral Oral  SpO2: 99% 98% 99% 99%  Weight:      Height:        Intake/Output Summary (Last 24 hours) at 01/30/2021 1333 Last data filed at 01/30/2021 1253 Gross per 24 hour  Intake 1300 ml  Output --  Net 1300 ml    Filed Weights   01/26/21 0800 01/26/21 1435 01/27/21 0502  Weight: (!) 197.8 kg (!) 186.2 kg (!) 185.9 kg    Examination:  General exam: Morbidly obese lady not in distress.  Respiratory system: Clear to auscultation. Respiratory effort normal. Cardiovascular system: S1 & S2 heard, RRR. No JVD,  No pedal  edema. Gastrointestinal system: Abdomen is nondistended, soft and nontender. Normal bowel sounds heard. Central nervous system: Alert and oriented. No focal neurological deficits. Extremities: Symmetric 5 x 5 power. Skin: No rashes, lesions or ulcers Psychiatry: Mood & affect appropriate.        Data Reviewed: I have personally reviewed following labs and imaging studies  CBC: Recent Labs  Lab 01/25/21 2143 01/27/21 0417  WBC 11.1* 8.4  NEUTROABS 7.3  --   HGB 11.4* 10.6*  HCT 37.9 35.2*  MCV 85.6 84.6  PLT 362 318     Basic Metabolic Panel: Recent Labs  Lab 01/25/21 2143 01/27/21 0417 01/29/21 0443  NA 137 137 139  K 3.6 3.2* 3.6  CL 104 107 106  CO2 22 24 25   GLUCOSE 98 88 98  BUN 15 11 14   CREATININE 1.32* 1.11* 1.28*  CALCIUM 9.4 8.8* 8.9     GFR: Estimated Creatinine Clearance: 94.7 mL/min (A) (by C-G formula based on SCr of 1.28 mg/dL (H)).  Liver Function Tests: Recent Labs  Lab 01/25/21 2143  AST 27  ALT 22  ALKPHOS 61  BILITOT 0.6  PROT 7.0  ALBUMIN 3.3*     CBG: No results for input(s): GLUCAP in the last 168 hours.   Recent Results (from the past 240 hour(s))  Urine Culture     Status: Abnormal   Collection Time: 01/25/21 10:06 PM   Specimen: Urine, Clean Catch  Result Value Ref Range Status   Specimen Description URINE, CLEAN CATCH  Final   Special Requests   Final    NONE Performed at [facility], [address] [place], [place] [address]    Culture MULTIPLE SPECIES PRESENT, SUGGEST RECOLLECTION (A)  Final   Report Status 01/27/2021 FINAL  Final  Resp Panel by RT-PCR (Flu A&B, Covid) Nasopharyngeal Swab     Status: None   Collection Time: 01/25/21 11:03 PM   Specimen: Nasopharyngeal Swab; Nasopharyngeal(NP) swabs in vial transport medium  Result Value Ref Range Status   SARS Coronavirus 2 by RT PCR NEGATIVE NEGATIVE Final    Comment: (NOTE) SARS-CoV-2 target nucleic acids are NOT DETECTED.  The SARS-CoV-2 RNA is  generally detectable in upper respiratory specimens during the acute phase of infection. The lowest concentration of SARS-CoV-2 viral copies this assay can detect is 138 copies/mL. A negative result does not preclude SARS-Cov-2 infection and should not be used as the sole basis for treatment or other patient management decisions. A negative result may occur with  improper specimen collection/handling, submission of specimen other than nasopharyngeal swab, presence of viral mutation(s) within the areas targeted by this assay, and inadequate number  of viral copies(<138 copies/mL). A negative result must be combined with clinical observations, patient history, and epidemiological information. The expected result is Negative.  Fact Sheet for Patients:  EntrepreneurPulse.com.au  Fact Sheet for Healthcare Providers:  IncredibleEmployment.be  This test is no t yet approved or cleared by the [place] FDA and  has been authorized for detection and/or diagnosis of SARS-CoV-2 by FDA under an Emergency Use Authorization (EUA). This EUA will remain  in effect (meaning this test can be used) for the duration of the COVID-19 declaration under Section 564(b)(1) of the Act, 21 U.S.C.section 360bbb-3(b)(1), unless the authorization is terminated  or revoked sooner.       Influenza A by PCR NEGATIVE NEGATIVE Final   Influenza B by PCR NEGATIVE NEGATIVE Final    Comment: (NOTE) The Xpert Xpress SARS-CoV-2/FLU/RSV plus assay is intended as an aid in the diagnosis of influenza from Nasopharyngeal swab specimens and should not be used as a sole basis for treatment. Nasal washings and aspirates are unacceptable for Xpert Xpress SARS-CoV-2/FLU/RSV testing.  Fact Sheet for Patients: EntrepreneurPulse.com.au  Fact Sheet for Healthcare Providers: IncredibleEmployment.be  This test is not yet approved or cleared by the [place] FDA and has been authorized for detection and/or diagnosis of SARS-CoV-2 by FDA under an Emergency Use Authorization (EUA). This EUA will remain in effect (meaning this test can be used) for the duration of the COVID-19 declaration under Section 564(b)(1) of the Act, 21 U.S.C. section 360bbb-3(b)(1), unless the authorization is terminated or revoked.  Performed at [facility], [place] [address] [place], [place] [address]   Blood Culture (routine x 2)     Status: None (Preliminary result)   Collection Time: 01/25/21 11:45 PM   Specimen: BLOOD RIGHT HAND  Result Value Ref Range Status   Specimen Description BLOOD RIGHT HAND  Final   Special Requests   Final    BOTTLES DRAWN AEROBIC AND ANAEROBIC Blood Culture adequate volume   Culture   Final    NO GROWTH 4 DAYS Performed at [facility], [place] [address] [place], [place] [address]    Report Status PENDING  Incomplete  Blood Culture (routine x 2)     Status: None (Preliminary result)   Collection Time: 01/25/21 11:45 PM   Specimen: BLOOD LEFT WRIST  Result Value Ref Range Status   Specimen Description BLOOD LEFT WRIST  Final   Special Requests   Final    BOTTLES DRAWN AEROBIC AND ANAEROBIC Blood Culture adequate volume   Culture   Final    NO GROWTH 4 DAYS Performed at [facility], [place] [address] [place],  [address]    Report Status PENDING  Incomplete          Radiology Studies: DG Abd 1 View  Result Date: 01/28/2021 CLINICAL DATA:  Nausea and vomiting. EXAM: ABDOMEN - 1 VIEW COMPARISON:  January 26, 2021. FINDINGS: Suboptimal evaluation due to body habitus and incomplete assessment lateral soft tissues. Air and stool filled nondilated loops of bowel. Surgical clips project over the upper RIGHT abdomen. Degenerative changes of the lumbar spine. Revisualization of LEFT staghorn calculus, better assessed on recent CT. Multiple RIGHT-sided nephrolithiasis. Visualized lung bases are  unremarkable. IMPRESSION: Nonobstructive bowel gas pattern. Electronically Signed   By: Valentino Saxon M.D.   On: 01/28/2021 16:52        Scheduled Meds:  buPROPion  300 mg Oral Daily   docusate sodium  100 mg Oral BID  enoxaparin (LOVENOX) injection  0.5 mg/kg Subcutaneous Q24H   fluticasone  2 spray Each Nare BID   methocarbamol  500 mg Oral QID   mometasone-formoterol  2 puff Inhalation BID   montelukast  10 mg Oral QHS   pregabalin  100 mg Oral TID   rOPINIRole  4 mg Oral QHS   senna-docusate  2 tablet Oral Daily   topiramate  25 mg Oral BID   traZODone  50 mg Oral QHS   ziprasidone  20 mg Oral Daily   Continuous Infusions:     LOS: 4 days        Hosie Poisson, MD [facility]   To contact the attending provider between 7A-7P or the covering provider during after hours 7P-7A, please log into the web site [email or web address] and access using universal [place] password for that web site. If you do not have the password, please call the hospital operator.  01/30/2021, 1:33 PM

## 2021-01-31 DIAGNOSIS — E119 Type 2 diabetes mellitus without complications: Secondary | ICD-10-CM

## 2021-01-31 LAB — CULTURE, BLOOD (ROUTINE X 2)
Culture: NO GROWTH
Culture: NO GROWTH
Special Requests: ADEQUATE
Special Requests: ADEQUATE

## 2021-01-31 LAB — CBC
HCT: 34.3 % — ABNORMAL LOW (ref 36.0–46.0)
Hemoglobin: 10.1 g/dL — ABNORMAL LOW (ref 12.0–15.0)
MCH: 25.4 pg — ABNORMAL LOW (ref 26.0–34.0)
MCHC: 29.4 g/dL — ABNORMAL LOW (ref 30.0–36.0)
MCV: 86.2 fL (ref 80.0–100.0)
Platelets: 336 10*3/uL (ref 150–400)
RBC: 3.98 MIL/uL (ref 3.87–5.11)
RDW: 18.6 % — ABNORMAL HIGH (ref 11.5–15.5)
WBC: 10.2 10*3/uL (ref 4.0–10.5)
nRBC: 0 % (ref 0.0–0.2)

## 2021-01-31 LAB — BASIC METABOLIC PANEL
Anion gap: 8 (ref 5–15)
BUN: 18 mg/dL (ref 6–20)
CO2: 24 mmol/L (ref 22–32)
Calcium: 9.3 mg/dL (ref 8.9–10.3)
Chloride: 104 mmol/L (ref 98–111)
Creatinine, Ser: 1.19 mg/dL — ABNORMAL HIGH (ref 0.44–1.00)
GFR, Estimated: 58 mL/min — ABNORMAL LOW (ref >60)
Glucose, Bld: 88 mg/dL (ref 70–99)
Potassium: 4.3 mmol/L (ref 3.5–5.1)
Sodium: 136 mmol/L (ref 135–145)

## 2021-01-31 MED ORDER — GUAIFENESIN 100 MG/5ML PO LIQD
5.0000 mL | ORAL | Status: DC | PRN
  Administered 2021-01-31 – 2021-02-04 (×2): 5 mL via ORAL
  Filled 2021-01-31 (×2): qty 5

## 2021-01-31 MED ORDER — METOPROLOL TARTRATE 5 MG/5ML IV SOLN: 5.0000 mg | INTRAVENOUS | Status: DC | PRN

## 2021-01-31 MED ORDER — HYDRALAZINE HCL 20 MG/ML IJ SOLN: 10.0000 mg | INTRAMUSCULAR | Status: DC | PRN

## 2021-01-31 NOTE — NC FL2 (Signed)
[place] LEVEL OF CARE SCREENING TOOL     IDENTIFICATION  Patient Name: Heather [name] Sleep Birthdate: Apr 09, 1975 Sex: female Admission Date (Current Location): 01/25/2021  [place] and [place] Number:  Heather [name] [id number] [place] and Address:  [facility] [place], [place] [address], [place], [place] [address]      Provider Number: [id number]  Attending Physician Name and Address:  Damita Lack, MD  Relative Name and Phone Number:       Current Level of Care: Hospital Recommended Level of Care: Nursing Facility Prior Approval Number:    Date Approved/Denied:   PASRR Number:  (MUST ID #[id number]. Submitted for PASRR number 01/31/21)  Discharge Plan: SNF (Nursing home - long-term care)    Current Diagnoses: Patient Active Problem List   Diagnosis Date Noted   Cystitis 01/26/2021   Social problem 01/26/2021   Enterococcus faecalis infection 01/26/2021   Mood disorder ([place]) 01/26/2021   Chronic pain disorder 01/26/2021   Thrombocytosis 10/31/2020   CKD (chronic kidney disease), stage III ([place]) 10/31/2020   Major depressive disorder, recurrent ([place]) 09/13/2020   Insomnia    Anemia of chronic disease    Chronic bilateral low back pain without sciatica    Debility 08/24/2020   SIRS (systemic inflammatory response syndrome) ([place]) 08/18/2020   Pressure injury of skin 08/11/2020   Rhabdomyolysis 08/10/2020   Acute renal failure ([place]) 08/10/2020   PTSD (post-traumatic stress disorder) 05/15/2020   Major depressive disorder, recurrent episode, moderate ([place]) 05/05/2020   Generalized anxiety disorder 05/05/2020   Blood in stool    Gastritis and gastroduodenitis    Benign neoplasm of sigmoid colon    Class 3 severe obesity with serious comorbidity and body mass index (BMI) greater than or equal to 70 in adult ([place]) 06/16/2018   Nexplanon insertion 03/27/2017   Postpartum hypertension 03/05/2017   Perennial allergic rhinitis 11/05/2016    Mild persistent asthma with acute exacerbation 11/05/2016   Hypoglycemia 11/07/2015   OSA (obstructive sleep apnea) 11/07/2015   DM type 2 (diabetes mellitus, type 2) ([place]) 12/07/2014   Panniculitis 12/06/2014   Cellulitis, abdominal wall 11/11/2014   Abdominal pain 17/49/4496   Eosinophilic esophagitis 75/91/6384   Change in bowel habits 04/28/2013   RLS (restless legs syndrome) 08/08/2011   Adjustment disorder 66/59/9357   DYSMETABOLIC SYNDROME 01/77/9390   Essential hypertension 05/12/2006   Asthma 05/12/2006   OSTEOARTHRITIS 05/12/2006    Orientation RESPIRATION BLADDER Height & Weight     Self, Time, Situation, Place  Normal Continent Weight: (!) 409 lb 13.4 oz (185.9 kg) Height:  5\' 3"  (160 cm)  BEHAVIORAL SYMPTOMS/MOOD NEUROLOGICAL BOWEL NUTRITION STATUS      Continent Diet (Regular)  AMBULATORY STATUS COMMUNICATION OF NEEDS Skin   Supervision Verbally Normal                       Personal Care Assistance Level of Assistance  Bathing, Feeding, Dressing Bathing Assistance: Limited assistance Feeding assistance: Independent Dressing Assistance: Limited assistance     Functional Limitations Info  Sight, Hearing, Speech Sight Info: Adequate Hearing Info: Adequate Speech Info: Adequate    SPECIAL CARE FACTORS FREQUENCY  PT (By licensed PT)     PT Frequency: PT evaluation 10/24              Contractures Contractures Info: Not present    Additional Factors Info  Code Status, Allergies Code Status Info: Full Allergies Info: Amoxicillin, Bee Venom, Penicillin G, Penicillins, Adhesive (Tape),  Latex, Vancomycin           Current Medications (01/31/2021):  This is the current hospital active medication list Current Facility-Administered Medications  Medication Dose Route Frequency Provider Last Rate Last Admin   acetaminophen (TYLENOL) tablet 650 mg  650 mg Oral Q6H PRN Karmen Bongo, MD       Or   acetaminophen (TYLENOL) suppository 650 mg  650  mg Rectal Q6H PRN Karmen Bongo, MD       albuterol (PROVENTIL) (2.5 MG/3ML) 0.083% nebulizer solution 3 mL  3 mL Inhalation Q4H PRN Karmen Bongo, MD   3 mL at 01/27/21 1722   bisacodyl (DULCOLAX) EC tablet 5 mg  5 mg Oral Daily PRN Karmen Bongo, MD       buPROPion (WELLBUTRIN XL) 24 hr tablet 300 mg  300 mg Oral Daily Karmen Bongo, MD   300 mg at 01/30/21 0901   clonazePAM (KLONOPIN) disintegrating tablet 0.25 mg  0.25 mg Oral Daily PRN Karmen Bongo, MD       diphenhydrAMINE (BENADRYL) injection 25 mg  25 mg Intravenous PRN Karmen Bongo, MD   25 mg at 01/26/21 1059   docusate sodium (COLACE) capsule 100 mg  100 mg Oral BID Karmen Bongo, MD   100 mg at 01/30/21 2131   enoxaparin (LOVENOX) injection 100 mg  0.5 mg/kg Subcutaneous Q24H Karmen Bongo, MD   100 mg at 01/30/21 1715   fluticasone (FLONASE) 50 MCG/ACT nasal spray 2 spray  2 spray Each Nare BID Karmen Bongo, MD   2 spray at 01/30/21 2131   hydrALAZINE (APRESOLINE) injection 10 mg  10 mg Intravenous Q4H PRN Amin, Ankit Chirag, MD       methocarbamol (ROBAXIN) tablet 500 mg  500 mg Oral QID Karmen Bongo, MD   500 mg at 01/30/21 2131   metoprolol tartrate (LOPRESSOR) injection 5 mg  5 mg Intravenous Q4H PRN Amin, Jeanella Flattery, MD       mometasone-formoterol (DULERA) 100-5 MCG/ACT inhaler 2 puff  2 puff Inhalation BID Karmen Bongo, MD   2 puff at 01/30/21 2052   montelukast (SINGULAIR) tablet 10 mg  10 mg Oral Ivery Quale, MD   10 mg at 01/30/21 2329   ondansetron (ZOFRAN) tablet 4 mg  4 mg Oral Q6H PRN Karmen Bongo, MD       Or   ondansetron [facility]) injection 4 mg  4 mg Intravenous Q6H PRN Karmen Bongo, MD   4 mg at 01/28/21 0017   polyethylene glycol (MIRALAX / GLYCOLAX) packet 17 g  17 g Oral Daily PRN Karmen Bongo, MD       pregabalin (LYRICA) capsule 100 mg  100 mg Oral TID Karmen Bongo, MD   100 mg at 01/30/21 2130   rOPINIRole (REQUIP) tablet 4 mg  4 mg Oral Ivery Quale, MD    4 mg at 01/30/21 2131   senna-docusate (Senokot-S) tablet 2 tablet  2 tablet Oral Daily Karmen Bongo, MD   2 tablet at 01/30/21 0901   topiramate (TOPAMAX) tablet 25 mg  25 mg Oral BID Karmen Bongo, MD   25 mg at 01/30/21 2131   traZODone (DESYREL) tablet 50 mg  50 mg Oral Ivery Quale, MD   50 mg at 01/30/21 2131   ziprasidone (GEODON) capsule 20 mg  20 mg Oral Daily Karmen Bongo, MD   20 mg at 01/30/21 4944     Discharge Medications: Please see discharge summary for a list of discharge medications.  Relevant Imaging  Results:  Relevant Lab Results:   Additional Information ss#[id number]. Has had 1st pfizer vaccine.  Patient is 409 pounds  Sharlet [place], [place]a [place], [place]

## 2021-01-31 NOTE — Progress Notes (Signed)
PROGRESS NOTE    Heather [name]  UKG:[id number] DOB: 19-Feb-1976 DOA: 01/25/2021 PCP: Lucianne Lei, MD   Brief Narrative:  45 y.o. female with medical history significant of COPD; anxiety/depression/schizoaffective d/o; DM; HLD; OSA; and morbid obesity presenting with urinary symptoms.  She was seen on 10/1 at [facility] for mental health issues and she had a +urine culture. She was admitted for UTI.  Urine cultures from 10/1 showed Enterococcus fecalis. Repeat urine cultures show multiple bacteria. She was started on IV vancomycin and transitioned to oral levaquin. We will go ahead d/c levaquin and transition her to fosfomycin. She reports she is homeless and does not have a place to go. Toc on board and assisting with placement. Therapy eval ordered recommended no PT follow up.    Assessment & Plan:   Active Problems:   Essential hypertension   Asthma   RLS (restless legs syndrome)   DM type 2 (diabetes mellitus, type 2) (HCC)   OSA (obstructive sleep apnea)   Class 3 severe obesity with serious comorbidity and body mass index (BMI) greater than or equal to 70 in adult [facility])   CKD (chronic kidney disease), stage III (HCC)   Cystitis   Social problem   Enterococcus faecalis infection   Mood disorder ([place])   Chronic pain disorder   Cystitis/UTI secondary to E. faecalis -She is already completed treatment with IV vancomycin, Levaquin and even fosfomycin.  Continue to monitor.-Requip   RLS: -Resume requip.      Type 2 DM: -Sliding scale and Accu-Cheks   Nausea and vomiting:  -Resolved   OSA on CPAP.  -Creatinine at baseline of 1.3   Stage 3 a CKD:  Creatinine at baseline around 1.2 to 1.3     Body mass index is 72.6 kg/m. Morbid obesity.  Recommend outpatient follow up with PCP for weight loss and healthy lifestyle changes.     Asthma No wheezing heard.  Resume home meds.      Hypertension:  BP parameters are optimal.    Chronic pain syndrome:  -resume home  meds.      Anemia of chronic disease:  Hemoglobin stable around 10.      Hypokalemia:  Replaced.    Stable hypodense lesion in the upper pole of the right kidney dating back to 2019 consistent with a complex cyst. Recommend outpatient follow up with Urologist as needed.    DVT prophylaxis: Lovenox Code Status: Full  Family Communication:  None  Status is: Inpatient  Remains inpatient appropriate because: Pending placement, TOC team aware       Nutritional status           Body mass index is 72.6 kg/m.           Subjective: No complaints, feels ok.   Review of Systems Otherwise negative except as per HPI, including: General: Denies fever, chills, night sweats or unintended weight loss. Resp: Denies cough, wheezing, shortness of breath. Cardiac: Denies chest pain, palpitations, orthopnea, paroxysmal nocturnal dyspnea. GI: Denies abdominal pain, nausea, vomiting, diarrhea or constipation GU: Denies dysuria, frequency, hesitancy or incontinence MS: Denies muscle aches, joint pain or swelling Neuro: Denies headache, neurologic deficits (focal weakness, numbness, tingling), abnormal gait Psych: Denies anxiety, depression, SI/HI/AVH Skin: Denies new rashes or lesions ID: Denies sick contacts, exotic exposures, travel  Examination:  General exam: Appears calm and comfortable . Morbidly obese Respiratory system: Clear to auscultation. Respiratory effort normal. Cardiovascular system: S1 & S2 heard, RRR. No JVD, murmurs, rubs, gallops or  clicks. No pedal edema. Gastrointestinal system: Abdomen is nondistended, soft and nontender. No organomegaly or masses felt. Normal bowel sounds heard. Central nervous system: Alert and oriented. No focal neurological deficits. Extremities: Symmetric 5 x 5 power. Skin: No rashes, lesions or ulcers Psychiatry: Judgement and insight appear normal. Mood & affect appropriate.     Objective: Vitals:   01/30/21 2043  01/30/21 2052 01/31/21 0607 01/31/21 0955  BP: 132/78  94/62 108/71  Pulse: 86  84 92  Resp: 18  17 18   Temp: 98.4 F (36.9 C)  98.1 F (36.7 C) 98.2 F (36.8 C)  TempSrc: Oral  Oral Oral  SpO2: 100% 100% 97% 99%  Weight:      Height:        Intake/Output Summary (Last 24 hours) at 01/31/2021 1212 Last data filed at 01/31/2021 0955 Gross per 24 hour  Intake 1160 ml  Output 1751 ml  Net -591 ml   Filed Weights   01/26/21 0800 01/26/21 1435 01/27/21 0502  Weight: (!) 197.8 kg (!) 186.2 kg (!) 185.9 kg     Data Reviewed:   CBC: Recent Labs  Lab 01/25/21 2143 01/27/21 0417 01/31/21 0206  WBC 11.1* 8.4 10.2  NEUTROABS 7.3  --   --   HGB 11.4* 10.6* 10.1*  HCT 37.9 35.2* 34.3*  MCV 85.6 84.6 86.2  PLT 362 318 619   Basic Metabolic Panel: Recent Labs  Lab 01/25/21 2143 01/27/21 0417 01/29/21 0443 01/31/21 0206  NA 137 137 139 136  K 3.6 3.2* 3.6 4.3  CL 104 107 106 104  CO2 22 24 25 24   GLUCOSE 98 88 98 88  BUN 15 11 14 18   CREATININE 1.32* 1.11* 1.28* 1.19*  CALCIUM 9.4 8.8* 8.9 9.3   GFR: Estimated Creatinine Clearance: 101.8 mL/min (A) (by C-G formula based on SCr of 1.19 mg/dL (H)). Liver Function Tests: Recent Labs  Lab 01/25/21 2143  AST 27  ALT 22  ALKPHOS 61  BILITOT 0.6  PROT 7.0  ALBUMIN 3.3*   Recent Labs  Lab 01/25/21 2143  LIPASE 34   No results for input(s): AMMONIA in the last 168 hours. Coagulation Profile: Recent Labs  Lab 01/25/21 2307  INR 1.2   Cardiac Enzymes: No results for input(s): CKTOTAL, CKMB, CKMBINDEX, TROPONINI in the last 168 hours. BNP (last 3 results) No results for input(s): PROBNP in the last 8760 hours. HbA1C: No results for input(s): HGBA1C in the last 72 hours. CBG: No results for input(s): GLUCAP in the last 168 hours. Lipid Profile: No results for input(s): CHOL, HDL, LDLCALC, TRIG, CHOLHDL, LDLDIRECT in the last 72 hours. Thyroid Function Tests: No results for input(s): TSH, T4TOTAL,  FREET4, T3FREE, THYROIDAB in the last 72 hours. Anemia Panel: No results for input(s): VITAMINB12, FOLATE, FERRITIN, TIBC, IRON, RETICCTPCT in the last 72 hours. Sepsis Labs: Recent Labs  Lab 01/25/21 2307  LATICACIDVEN 1.6    Recent Results (from the past 240 hour(s))  Urine Culture     Status: Abnormal   Collection Time: 01/25/21 10:06 PM   Specimen: Urine, Clean Catch  Result Value Ref Range Status   Specimen Description URINE, CLEAN CATCH  Final   Special Requests   Final    NONE Performed at [facility], [place] [address], [place], [place] [address]    Culture MULTIPLE SPECIES PRESENT, SUGGEST RECOLLECTION (A)  Final   Report Status 01/27/2021 FINAL  Final  Resp Panel by RT-PCR (Flu A&B, Covid) Nasopharyngeal Swab  Status: None   Collection Time: 01/25/21 11:03 PM   Specimen: Nasopharyngeal Swab; Nasopharyngeal(NP) swabs in vial transport medium  Result Value Ref Range Status   SARS Coronavirus 2 by RT PCR NEGATIVE NEGATIVE Final    Comment: (NOTE) SARS-CoV-2 target nucleic acids are NOT DETECTED.  The SARS-CoV-2 RNA is generally detectable in upper respiratory specimens during the acute phase of infection. The lowest concentration of SARS-CoV-2 viral copies this assay can detect is 138 copies/mL. A negative result does not preclude SARS-Cov-2 infection and should not be used as the sole basis for treatment or other patient management decisions. A negative result may occur with  improper specimen collection/handling, submission of specimen other than nasopharyngeal swab, presence of viral mutation(s) within the areas targeted by this assay, and inadequate number of viral copies(<138 copies/mL). A negative result must be combined with clinical observations, patient history, and epidemiological information. The expected result is Negative.  Fact Sheet for Patients:  EntrepreneurPulse.com.au  Fact Sheet for Healthcare Providers:   IncredibleEmployment.be  This test is no t yet approved or cleared by the [place] FDA and  has been authorized for detection and/or diagnosis of SARS-CoV-2 by FDA under an Emergency Use Authorization (EUA). This EUA will remain  in effect (meaning this test can be used) for the duration of the COVID-19 declaration under Section 564(b)(1) of the Act, 21 U.S.C.section 360bbb-3(b)(1), unless the authorization is terminated  or revoked sooner.       Influenza A by PCR NEGATIVE NEGATIVE Final   Influenza B by PCR NEGATIVE NEGATIVE Final    Comment: (NOTE) The Xpert Xpress SARS-CoV-2/FLU/RSV plus assay is intended as an aid in the diagnosis of influenza from Nasopharyngeal swab specimens and should not be used as a sole basis for treatment. Nasal washings and aspirates are unacceptable for Xpert Xpress SARS-CoV-2/FLU/RSV testing.  Fact Sheet for Patients: EntrepreneurPulse.com.au  Fact Sheet for Healthcare Providers: IncredibleEmployment.be  This test is not yet approved or cleared by the [place] FDA and has been authorized for detection and/or diagnosis of SARS-CoV-2 by FDA under an Emergency Use Authorization (EUA). This EUA will remain in effect (meaning this test can be used) for the duration of the COVID-19 declaration under Section 564(b)(1) of the Act, 21 U.S.C. section 360bbb-3(b)(1), unless the authorization is terminated or revoked.  Performed at [facility], [place] [address], [place], [place] [address]   Blood Culture (routine x 2)     Status: None   Collection Time: 01/25/21 11:45 PM   Specimen: BLOOD RIGHT HAND  Result Value Ref Range Status   Specimen Description BLOOD RIGHT HAND  Final   Special Requests   Final    BOTTLES DRAWN AEROBIC AND ANAEROBIC Blood Culture adequate volume   Culture   Final    NO GROWTH 5 DAYS Performed at [facility], [place] [address] [place], [place]  [address]    Report Status 01/31/2021 FINAL  Final  Blood Culture (routine x 2)     Status: None   Collection Time: 01/25/21 11:45 PM   Specimen: BLOOD LEFT WRIST  Result Value Ref Range Status   Specimen Description BLOOD LEFT WRIST  Final   Special Requests   Final    BOTTLES DRAWN AEROBIC AND ANAEROBIC Blood Culture adequate volume   Culture   Final    NO GROWTH 5 DAYS Performed at [facility], [place] [address] [place], [place] [address]    Report Status 01/31/2021 FINAL  Final  Radiology Studies: No results found.      Scheduled Meds:  buPROPion  300 mg Oral Daily   docusate sodium  100 mg Oral BID   enoxaparin (LOVENOX) injection  0.5 mg/kg Subcutaneous Q24H   fluticasone  2 spray Each Nare BID   methocarbamol  500 mg Oral QID   mometasone-formoterol  2 puff Inhalation BID   montelukast  10 mg Oral QHS   pregabalin  100 mg Oral TID   rOPINIRole  4 mg Oral QHS   senna-docusate  2 tablet Oral Daily   topiramate  25 mg Oral BID   traZODone  50 mg Oral QHS   ziprasidone  20 mg Oral Daily   Continuous Infusions:   LOS: 5 days   Time spent= 35 mins    Mackenzy Grumbine Arsenio Loader, MD [facility]  If 7PM-7AM, please contact night-coverage  01/31/2021, 12:12 PM

## 2021-02-01 DIAGNOSIS — I10 Essential (primary) hypertension: Secondary | ICD-10-CM

## 2021-02-01 DIAGNOSIS — F39 Unspecified mood [affective] disorder: Secondary | ICD-10-CM

## 2021-02-01 LAB — BASIC METABOLIC PANEL
Anion gap: 6 (ref 5–15)
BUN: 21 mg/dL — ABNORMAL HIGH (ref 6–20)
CO2: 25 mmol/L (ref 22–32)
Calcium: 9.1 mg/dL (ref 8.9–10.3)
Chloride: 103 mmol/L (ref 98–111)
Creatinine, Ser: 1.28 mg/dL — ABNORMAL HIGH (ref 0.44–1.00)
GFR, Estimated: 53 mL/min — ABNORMAL LOW (ref >60)
Glucose, Bld: 90 mg/dL (ref 70–99)
Potassium: 4 mmol/L (ref 3.5–5.1)
Sodium: 134 mmol/L — ABNORMAL LOW (ref 135–145)

## 2021-02-01 LAB — CBC
HCT: 34.4 % — ABNORMAL LOW (ref 36.0–46.0)
Hemoglobin: 10.2 g/dL — ABNORMAL LOW (ref 12.0–15.0)
MCH: 25.2 pg — ABNORMAL LOW (ref 26.0–34.0)
MCHC: 29.7 g/dL — ABNORMAL LOW (ref 30.0–36.0)
MCV: 85.1 fL (ref 80.0–100.0)
Platelets: 344 10*3/uL (ref 150–400)
RBC: 4.04 MIL/uL (ref 3.87–5.11)
RDW: 18.6 % — ABNORMAL HIGH (ref 11.5–15.5)
WBC: 9.6 10*3/uL (ref 4.0–10.5)
nRBC: 0 % (ref 0.0–0.2)

## 2021-02-01 LAB — MAGNESIUM: Magnesium: 2.2 mg/dL (ref 1.7–2.4)

## 2021-02-01 NOTE — Progress Notes (Signed)
Physical Therapy Treatment Patient Details Name: Heather [name] MRN: [id number] DOB: 1975-10-12 Today's Date: 02/01/2021   History of Present Illness Pt is a 45 y.o. F who presents 01/25/2021 with a UTI. Significant PMH: COPD, anxiety/depression/schizoaffective disorder, DM, morbid obesity.    PT Comments    Pt tolerates treatment well, ambulating for limited community distances with use of walker. Pt does demonstrate difficulty transferring from lower surfaces, needing physical assistance to stand from commode and needing elevation of bed ~2 inches to ascend without physical assistance. Pt will benefit from continued acute PT services to aide in improving LE power and endurance in an effort to progress to independent mobility. Pt appears to be near recent baseline from previous hospitalizations. PT recommends no follow-up PT needs at the time of discharge. Pt will likely benefit from continued assistance/setup for ADLs.  Recommendations for follow up therapy are one component of a multi-disciplinary discharge planning process, led by the attending physician.  Recommendations may be updated based on patient status, additional functional criteria and insurance authorization.  Follow Up Recommendations  No PT follow up     Assistance Recommended at Discharge Set up Supervision/Assistance (supervision/assistance for ADLs)  Equipment Recommendations  None recommended by PT    Recommendations for Other Services       Precautions / Restrictions Precautions Precautions: Fall Restrictions Weight Bearing Restrictions: No     Mobility  Bed Mobility Overal bed mobility: Needs Assistance Bed Mobility: Supine to Sit     Supine to sit: Modified independent (Device/Increase time);HOB elevated     General bed mobility comments: use of bed rail    Transfers Overall transfer level: Needs assistance Equipment used: Rolling walker (2 wheels) Transfers: Sit to/from [employer]  Transfers Sit to Stand: Min assist;Min guard Stand pivot transfers: Min guard         General transfer comment: minA from commode, minG from bed or bedside commode    Ambulation/Gait Ambulation/Gait assistance: Supervision Gait Distance (Feet): 300 Feet Assistive device: Rolling walker (2 wheels) (bariatric RW) Gait Pattern/deviations: Step-through pattern;Wide base of support Gait velocity: slowed Gait velocity interpretation: <1.8 ft/sec, indicate of risk for recurrent falls General Gait Details: pt with slowed step-through gait, 2 brief standing rest breaks due to reports of fatigue   Stairs             Wheelchair Mobility    Modified Rankin (Stroke Patients Only)       Balance Overall balance assessment: Needs assistance Sitting-balance support: No upper extremity supported;Feet supported Sitting balance-Leahy Scale: Good     Standing balance support: Single extremity supported Standing balance-Leahy Scale: Fair                              Cognition Arousal/Alertness: Awake/alert Behavior During Therapy: WFL for tasks assessed/performed Overall Cognitive Status: History of cognitive impairments - at baseline                                 General Comments: pt appears to be at baseline from previous admission.        Exercises      General Comments General comments (skin integrity, edema, etc.): VSS on RA      Pertinent Vitals/Pain Pain Assessment: No/denies pain    Home Living  Prior Function            PT Goals (current goals can now be found in the care plan section) Acute Rehab PT Goals Patient Stated Goal: to get more support Progress towards PT goals: Progressing toward goals    Frequency    Min 3X/week      PT Plan Current plan remains appropriate    Co-evaluation              AM-PAC PT "6 Clicks" Mobility   Outcome Measure  Help needed turning from  your back to your side while in a flat bed without using bedrails?: None Help needed moving from lying on your back to sitting on the side of a flat bed without using bedrails?: None Help needed moving to and from a bed to a chair (including a wheelchair)?: A Little Help needed standing up from a chair using your arms (e.g., wheelchair or bedside chair)?: A Little Help needed to walk in hospital room?: A Little Help needed climbing 3-5 steps with a railing? : A Lot 6 Click Score: 19    End of Session   Activity Tolerance: Patient tolerated treatment well Patient left: in chair;with call bell/phone within reach Nurse Communication: Mobility status PT Visit Diagnosis: Unsteadiness on feet (R26.81);Muscle weakness (generalized) (M62.81);History of falling (Z91.81)     Time: 4196-2229 PT Time Calculation (min) (ACUTE ONLY): 25 min  Charges:  $Gait Training: 8-22 mins $Therapeutic Activity: 8-22 mins                     Zenaida Niece, PT, DPT Acute Rehabilitation Pager: [phone] Office [place] 02/01/2021, 12:52 PM

## 2021-02-01 NOTE — Progress Notes (Signed)
PROGRESS NOTE    Heather [name]  ZOX:[id number] DOB: [birth year] DOA: 01/25/2021 PCP: Lucianne Lei, MD   Brief Narrative:  45 y.o. female with medical history significant of COPD; anxiety/depression/schizoaffective d/o; DM; HLD; OSA; and morbid obesity presenting with urinary symptoms.  She was seen on 10/1 at [facility] for mental health issues and she had a +urine culture. She was admitted for UTI.  Urine cultures from 10/1 showed Enterococcus fecalis. Repeat urine cultures show multiple bacteria. She was started on IV vancomycin and transitioned to oral levaquin. We will go ahead d/c levaquin and transition her to fosfomycin. She reports she is homeless and does not have a place to go. Toc on board and assisting with placement. Therapy eval ordered recommended no PT follow up.    Assessment & Plan:   Active Problems:   Essential hypertension   Asthma   RLS (restless legs syndrome)   DM type 2 (diabetes mellitus, type 2) (HCC)   OSA (obstructive sleep apnea)   Class 3 severe obesity with serious comorbidity and body mass index (BMI) greater than or equal to 70 in adult [facility])   CKD (chronic kidney disease), stage III (HCC)   Cystitis   Social problem   Enterococcus faecalis infection   Mood disorder ([place])   Chronic pain disorder   Cystitis/UTI secondary to E. faecalis -She is already completed treatment with IV vancomycin, Levaquin and even fosfomycin.  Continue to monitor.   RLS -On requip.      Nausea and vomiting:  -Resolved   OSA on CPAP.  -Creatinine at baseline of 1.3   Stage 3 a CKD:  Creatinine at baseline around 1.2 to 1.3     Body mass index is 72.6 kg/m. Morbid obesity.  Recommend outpatient follow up with PCP for weight loss and healthy lifestyle changes.     Asthma No wheezing heard.  Resume home meds.      Hypertension:  BP parameters are optimal.    Chronic pain syndrome:  -resume home meds.      Anemia of chronic disease:  Hemoglobin  stable around 10.      Hypokalemia:  Replaced.    Stable hypodense lesion in the upper pole of the right kidney dating back to 2019 consistent with a complex cyst. Recommend outpatient follow up with Urologist as needed.    DVT prophylaxis: Lovenox Code Status: Full  Family Communication:  None  Status is: Inpatient  Remains inpatient appropriate because: Pending placement, TOC team aware       Nutritional status           Body mass index is 75.92 kg/m.           Subjective: Feels ok, no acute events overnight.   Review of Systems Otherwise negative except as per HPI, including: General: Denies fever, chills, night sweats or unintended weight loss. Resp: Denies cough, wheezing, shortness of breath. Cardiac: Denies chest pain, palpitations, orthopnea, paroxysmal nocturnal dyspnea. GI: Denies abdominal pain, nausea, vomiting, diarrhea or constipation GU: Denies dysuria, frequency, hesitancy or incontinence MS: Denies muscle aches, joint pain or swelling Neuro: Denies headache, neurologic deficits (focal weakness, numbness, tingling), abnormal gait Psych: Denies anxiety, depression, SI/HI/AVH Skin: Denies new rashes or lesions ID: Denies sick contacts, exotic exposures, travel  Examination:  Constitutional: Not in acute distress; obese Respiratory: Clear to auscultation bilaterally Cardiovascular: Normal sinus rhythm, no rubs Abdomen: Nontender nondistended good bowel sounds Musculoskeletal: No edema noted Skin: No rashes seen Neurologic: CN 2-12 grossly intact.  And nonfocal Psychiatric: Normal judgment and insight. Alert and oriented x 3. Normal mood.     Objective: Vitals:   01/31/21 2052 02/01/21 0511 02/01/21 0851 02/01/21 0936  BP: 113/76 97/69  114/65  Pulse: 86 72 69 82  Resp: 18 18 16 18   Temp: 98.5 F (36.9 C) 98 F (36.7 C)  98.9 F (37.2 C)  TempSrc: Oral Oral  Oral  SpO2: 100% 99% 98% 99%  Weight:  (!) 194.4 kg    Height:         Intake/Output Summary (Last 24 hours) at 02/01/2021 1121 Last data filed at 02/01/2021 0835 Gross per 24 hour  Intake 1420 ml  Output 1 ml  Net 1419 ml   Filed Weights   01/26/21 1435 01/27/21 0502 02/01/21 0511  Weight: (!) 186.2 kg (!) 185.9 kg (!) 194.4 kg     Data Reviewed:   CBC: Recent Labs  Lab 01/25/21 2143 01/27/21 0417 01/31/21 0206 02/01/21 0440  WBC 11.1* 8.4 10.2 9.6  NEUTROABS 7.3  --   --   --   HGB 11.4* 10.6* 10.1* 10.2*  HCT 37.9 35.2* 34.3* 34.4*  MCV 85.6 84.6 86.2 85.1  PLT 362 318 336 151   Basic Metabolic Panel: Recent Labs  Lab 01/25/21 2143 01/27/21 0417 01/29/21 0443 01/31/21 0206 02/01/21 0440  NA 137 137 139 136 134*  K 3.6 3.2* 3.6 4.3 4.0  CL 104 107 106 104 103  CO2 22 24 25 24 25   GLUCOSE 98 88 98 88 90  BUN 15 11 14 18  21*  CREATININE 1.32* 1.11* 1.28* 1.19* 1.28*  CALCIUM 9.4 8.8* 8.9 9.3 9.1  MG  --   --   --   --  2.2   GFR: Estimated Creatinine Clearance: 97.7 mL/min (A) (by C-G formula based on SCr of 1.28 mg/dL (H)). Liver Function Tests: Recent Labs  Lab 01/25/21 2143  AST 27  ALT 22  ALKPHOS 61  BILITOT 0.6  PROT 7.0  ALBUMIN 3.3*   Recent Labs  Lab 01/25/21 2143  LIPASE 34   No results for input(s): AMMONIA in the last 168 hours. Coagulation Profile: Recent Labs  Lab 01/25/21 2307  INR 1.2   Cardiac Enzymes: No results for input(s): CKTOTAL, CKMB, CKMBINDEX, TROPONINI in the last 168 hours. BNP (last 3 results) No results for input(s): PROBNP in the last 8760 hours. HbA1C: No results for input(s): HGBA1C in the last 72 hours. CBG: No results for input(s): GLUCAP in the last 168 hours. Lipid Profile: No results for input(s): CHOL, HDL, LDLCALC, TRIG, CHOLHDL, LDLDIRECT in the last 72 hours. Thyroid Function Tests: No results for input(s): TSH, T4TOTAL, FREET4, T3FREE, THYROIDAB in the last 72 hours. Anemia Panel: No results for input(s): VITAMINB12, FOLATE, FERRITIN, TIBC, IRON,  RETICCTPCT in the last 72 hours. Sepsis Labs: Recent Labs  Lab 01/25/21 2307  LATICACIDVEN 1.6    Recent Results (from the past 240 hour(s))  Urine Culture     Status: Abnormal   Collection Time: 01/25/21 10:06 PM   Specimen: Urine, Clean Catch  Result Value Ref Range Status   Specimen Description URINE, CLEAN CATCH  Final   Special Requests   Final    NONE Performed at [facility], [place] [address], [place], [place] [address]    Culture MULTIPLE SPECIES PRESENT, SUGGEST RECOLLECTION (A)  Final   Report Status 01/27/2021 FINAL  Final  Resp Panel by RT-PCR (Flu A&B, Covid) Nasopharyngeal Swab  Status: None   Collection Time: 01/25/21 11:03 PM   Specimen: Nasopharyngeal Swab; Nasopharyngeal(NP) swabs in vial transport medium  Result Value Ref Range Status   SARS Coronavirus 2 by RT PCR NEGATIVE NEGATIVE Final    Comment: (NOTE) SARS-CoV-2 target nucleic acids are NOT DETECTED.  The SARS-CoV-2 RNA is generally detectable in upper respiratory specimens during the acute phase of infection. The lowest concentration of SARS-CoV-2 viral copies this assay can detect is 138 copies/mL. A negative result does not preclude SARS-Cov-2 infection and should not be used as the sole basis for treatment or other patient management decisions. A negative result may occur with  improper specimen collection/handling, submission of specimen other than nasopharyngeal swab, presence of viral mutation(s) within the areas targeted by this assay, and inadequate number of viral copies(<138 copies/mL). A negative result must be combined with clinical observations, patient history, and epidemiological information. The expected result is Negative.  Fact Sheet for Patients:  EntrepreneurPulse.com.au  Fact Sheet for Healthcare Providers:  IncredibleEmployment.be  This test is no t yet approved or cleared by the [place] FDA and  has been authorized for  detection and/or diagnosis of SARS-CoV-2 by FDA under an Emergency Use Authorization (EUA). This EUA will remain  in effect (meaning this test can be used) for the duration of the COVID-19 declaration under Section 564(b)(1) of the Act, 21 U.S.C.section 360bbb-3(b)(1), unless the authorization is terminated  or revoked sooner.       Influenza A by PCR NEGATIVE NEGATIVE Final   Influenza B by PCR NEGATIVE NEGATIVE Final    Comment: (NOTE) The Xpert Xpress SARS-CoV-2/FLU/RSV plus assay is intended as an aid in the diagnosis of influenza from Nasopharyngeal swab specimens and should not be used as a sole basis for treatment. Nasal washings and aspirates are unacceptable for Xpert Xpress SARS-CoV-2/FLU/RSV testing.  Fact Sheet for Patients: EntrepreneurPulse.com.au  Fact Sheet for Healthcare Providers: IncredibleEmployment.be  This test is not yet approved or cleared by the [place] FDA and has been authorized for detection and/or diagnosis of SARS-CoV-2 by FDA under an Emergency Use Authorization (EUA). This EUA will remain in effect (meaning this test can be used) for the duration of the COVID-19 declaration under Section 564(b)(1) of the Act, 21 U.S.C. section 360bbb-3(b)(1), unless the authorization is terminated or revoked.  Performed at [facility], [place] [address], [place], [place] [address]   Blood Culture (routine x 2)     Status: None   Collection Time: 01/25/21 11:45 PM   Specimen: BLOOD RIGHT HAND  Result Value Ref Range Status   Specimen Description BLOOD RIGHT HAND  Final   Special Requests   Final    BOTTLES DRAWN AEROBIC AND ANAEROBIC Blood Culture adequate volume   Culture   Final    NO GROWTH 5 DAYS Performed at [facility], [place] [address], [place], [place] [address]    Report Status 01/31/2021 FINAL  Final  Blood Culture (routine x 2)     Status: None   Collection Time: 01/25/21 11:45 PM   Specimen:  BLOOD LEFT WRIST  Result Value Ref Range Status   Specimen Description BLOOD LEFT WRIST  Final   Special Requests   Final    BOTTLES DRAWN AEROBIC AND ANAEROBIC Blood Culture adequate volume   Culture   Final    NO GROWTH 5 DAYS Performed at [facility], [place] [address], [place], [place] [address]    Report Status 01/31/2021 FINAL  Final  Radiology Studies: No results found.      Scheduled Meds:  buPROPion  300 mg Oral Daily   docusate sodium  100 mg Oral BID   enoxaparin (LOVENOX) injection  0.5 mg/kg Subcutaneous Q24H   fluticasone  2 spray Each Nare BID   methocarbamol  500 mg Oral QID   mometasone-formoterol  2 puff Inhalation BID   montelukast  10 mg Oral QHS   pregabalin  100 mg Oral TID   rOPINIRole  4 mg Oral QHS   senna-docusate  2 tablet Oral Daily   topiramate  25 mg Oral BID   traZODone  50 mg Oral QHS   ziprasidone  20 mg Oral Daily   Continuous Infusions:   LOS: 6 days   Time spent= 35 mins    Lydian Chavous Arsenio Loader, MD [facility]  If 7PM-7AM, please contact night-coverage  02/01/2021, 11:21 AM

## 2021-02-02 LAB — BASIC METABOLIC PANEL
Anion gap: 5 (ref 5–15)
BUN: 23 mg/dL — ABNORMAL HIGH (ref 6–20)
CO2: 25 mmol/L (ref 22–32)
Calcium: 8.9 mg/dL (ref 8.9–10.3)
Chloride: 104 mmol/L (ref 98–111)
Creatinine, Ser: 1.18 mg/dL — ABNORMAL HIGH (ref 0.44–1.00)
GFR, Estimated: 59 mL/min — ABNORMAL LOW (ref >60)
Glucose, Bld: 102 mg/dL — ABNORMAL HIGH (ref 70–99)
Potassium: 4.1 mmol/L (ref 3.5–5.1)
Sodium: 134 mmol/L — ABNORMAL LOW (ref 135–145)

## 2021-02-02 LAB — CBC
HCT: 34.1 % — ABNORMAL LOW (ref 36.0–46.0)
Hemoglobin: 10 g/dL — ABNORMAL LOW (ref 12.0–15.0)
MCH: 24.9 pg — ABNORMAL LOW (ref 26.0–34.0)
MCHC: 29.3 g/dL — ABNORMAL LOW (ref 30.0–36.0)
MCV: 85 fL (ref 80.0–100.0)
Platelets: 369 10*3/uL (ref 150–400)
RBC: 4.01 MIL/uL (ref 3.87–5.11)
RDW: 18.2 % — ABNORMAL HIGH (ref 11.5–15.5)
WBC: 11 10*3/uL — ABNORMAL HIGH (ref 4.0–10.5)
nRBC: 0 % (ref 0.0–0.2)

## 2021-02-02 LAB — MAGNESIUM: Magnesium: 2.3 mg/dL (ref 1.7–2.4)

## 2021-02-02 NOTE — Progress Notes (Signed)
Physician Discharge Summary  Heather [name] [place] QMG:[id number] DOB: November 22, 1975 DOA: 01/25/2021  PCP: Lucianne Lei, MD  Admit date: 01/25/2021 Discharge date: 02/02/2021  Admitted From: Home Disposition: Home  Recommendations for Outpatient Follow-up:  Follow up with PCP in 1-2 weeks Please obtain BMP/CBC in one week your next doctors visit.     Discharge Condition: Stable CODE STATUS: Full code Diet recommendation: Diabetic  Brief/Interim Summary: 45 y.o. female with medical history significant of COPD; anxiety/depression/schizoaffective d/o; DM; HLD; OSA; and morbid obesity presenting with urinary symptoms.  She was seen on 10/1 at [facility] for mental health issues and she had a +urine culture. She was admitted for UTI.  Urine cultures from 10/1 showed Enterococcus fecalis. Repeat urine cultures show multiple bacteria. She was started on IV vancomycin and transitioned to oral levaquin. We will go ahead d/c levaquin and transition her to fosfomycin. She reports she is homeless and does not have a place to go. Toc on board and assisting with placement. Therapy eval ordered recommended no PT follow up.  Patient is deemed medically stable to be discharged at this time without any acute inpatient medical needs.  She has all the recent prescriptions that were sent to the pharmacy with multiple refills during her recent hospitalization   Cystitis/UTI secondary to E. faecalis, resolved -She is already completed treatment with IV vancomycin, Levaquin and even fosfomycin.  Continue to monitor.   RLS -On requip.    Nausea and vomiting:  -Resolved   OSA on CPAP.    Stage 3 a CKD:  Creatinine at baseline around 1.2 to 1.3     Body mass index is 72.6 kg/m. Morbid obesity.  Recommend outpatient follow up with PCP for weight loss and healthy lifestyle changes.      Asthma No wheezing heard.  Resume home meds.      Hypertension:  BP parameters are optimal.    Chronic pain syndrome:   -resume home meds.      Anemia of chronic disease:  Hemoglobin stable around 10.      Hypokalemia:  Replaced.    Stable hypodense lesion in the upper pole of the right kidney dating back to 2019 consistent with a complex cyst. Recommend outpatient follow up with Urologist as needed.    Unfortunately due to medical noncompliance she remains at risk for recurrent admission.  Body mass index is 75.92 kg/m.         Discharge Diagnoses:  Active Problems:   Essential hypertension   Asthma   RLS (restless legs syndrome)   DM type 2 (diabetes mellitus, type 2) (HCC)   OSA (obstructive sleep apnea)   Class 3 severe obesity with serious comorbidity and body mass index (BMI) greater than or equal to 70 in adult [facility])   CKD (chronic kidney disease), stage III (HCC)   Cystitis   Social problem   Enterococcus faecalis infection   Mood disorder ([place])   Chronic pain disorder      Consultations: None  Subjective: Laying in the bed, no new complaints  Discharge Exam: Vitals:   02/02/21 0854 02/02/21 0901  BP: (!) 105/52   Pulse: 75   Resp: 17   Temp: 98.2 F (36.8 C)   SpO2: 100% 97%   Vitals:   02/01/21 2103 02/02/21 0458 02/02/21 0854 02/02/21 0901  BP: (!) 111/57 102/66 (!) 105/52   Pulse: 84 89 75   Resp: (!) 22 (!) 22 17   Temp: 98.3 F (36.8 C) (!) 97.5 F (36.4  C) 98.2 F (36.8 C)   TempSrc: Oral Oral Oral   SpO2: 100% 100% 100% 97%  Weight:      Height:        General: Pt is alert, awake, not in acute distress, morbidly obese Cardiovascular: RRR, S1/S2 +, no rubs, no gallops Respiratory: CTA bilaterally, no wheezing, no rhonchi Abdominal: Soft, NT, ND, bowel sounds + Extremities: no edema, no cyanosis  Discharge Instructions   Allergies as of 02/02/2021       Reactions   Amoxicillin Anaphylaxis   Bee Venom Shortness Of Breath   Penicillin G Anaphylaxis   Penicillins Anaphylaxis   Tolerates cephalexin and ceftriaxone Has patient had  a PCN reaction causing immediate rash, facial/tongue/throat swelling, SOB or lightheadedness with hypotension: No Has patient had a PCN reaction causing severe rash involving mucus membranes or skin necrosis: No Has patient had a PCN reaction that required hospitalization No Has patient had a PCN reaction occurring within the last 10 years: No If all of the above answers are "NO", then may proceed with Cephalosporin use.' REACTION: Angioedema   Adhesive [tape] Rash   Latex Rash   Vancomycin Other (See Comments)   Pt can tolerate Vancomycin but did cause Vancomycin Infusion Reaction.  Recommend to pre-medicate with Benadryl before doses administered.          Medication List     STOP taking these medications    montelukast 10 MG tablet Commonly known as: SINGULAIR   traMADol 50 MG tablet Commonly known as: ULTRAM       TAKE these medications    buPROPion 300 MG 24 hr tablet Commonly known as: WELLBUTRIN XL Take 1 tablet (300 mg total) by mouth daily.   clonazePAM 0.5 MG tablet Commonly known as: KLONOPIN Take 1/2 tablet (0.25 mg total) by mouth daily as needed (panic attacks).   clotrimazole 1 % cream Commonly known as: LOTRIMIN Apply topically 2 (two) times daily.   diclofenac Sodium 1 % Gel Commonly known as: VOLTAREN Apply 4 g topically 4 (four) times daily.   Dulera 100-5 MCG/ACT Aero Generic drug: mometasone-formoterol Inhale 2 puffs into the lungs 2 (two) times daily.   fluticasone 50 MCG/ACT nasal spray Commonly known as: FLONASE Place 2 sprays into both nostrils 2 (two) times daily. What changed:  when to take this reasons to take this   furosemide 40 MG tablet Commonly known as: LASIX Take 1 tablet (40 mg total) by mouth daily.   methocarbamol 500 MG tablet Commonly known as: ROBAXIN Take 1 tablet (500 mg total) by mouth 4 (four) times daily.   nystatin powder Commonly known as: MYCOSTATIN/NYSTOP Apply topically 3 (three) times daily.    oxyCODONE 5 MG immediate release tablet Commonly known as: Oxy IR/ROXICODONE Take 1 tablet (5 mg total) by mouth every 4 (four) hours as needed for moderate pain.   potassium chloride SA 20 MEQ tablet Commonly known as: KLOR-CON Take 1 tablet (20 mEq total) by mouth daily.   pregabalin 100 MG capsule Commonly known as: LYRICA Take 1 capsule (100 mg total) by mouth 3 (three) times daily.   ProAir HFA 108 (90 Base) MCG/ACT inhaler Generic drug: albuterol Inhale 2 puffs into the lungs every 4 (four) hours as needed for wheezing or shortness of breath.   rOPINIRole 4 MG tablet Commonly known as: REQUIP Take 1 tablet (4 mg total) by mouth at bedtime.   Senexon-S 8.6-50 MG tablet Generic drug: senna-docusate Take 2 tablets by mouth daily.   SSD  1 % cream Generic drug: silver sulfADIAZINE Apply topically daily.   topiramate 25 MG tablet Commonly known as: TOPAMAX Take 1 tablet (25 mg total) by mouth 2 (two) times daily.   traZODone 50 MG tablet Commonly known as: DESYREL Take 1 tablet (50 mg total) by mouth at bedtime.   vitamin B-12 1000 MCG tablet Commonly known as: CYANOCOBALAMIN Take 1 tablet (1,000 mcg total) by mouth daily.   vitamin C 500 MG tablet Commonly known as: ASCORBIC ACID Take 1 tablet (500 mg total) by mouth daily.   ziprasidone 20 MG capsule Commonly known as: GEODON Take 1 capsule (20 mg total) by mouth daily.        Follow-up Information     Lucianne Lei, MD Follow up.   Specialty: Family Medicine Contact information: [address] [place] [address] [phone]                Allergies  Allergen Reactions   Amoxicillin Anaphylaxis   Bee Venom Shortness Of Breath   Penicillin G Anaphylaxis   Penicillins Anaphylaxis    Tolerates cephalexin and ceftriaxone Has patient had a PCN reaction causing immediate rash, facial/tongue/throat swelling, SOB or lightheadedness with hypotension: No Has patient had a PCN reaction  causing severe rash involving mucus membranes or skin necrosis: No Has patient had a PCN reaction that required hospitalization No Has patient had a PCN reaction occurring within the last 10 years: No If all of the above answers are "NO", then may proceed with Cephalosporin use.'  REACTION: Angioedema   Adhesive [Tape] Rash   Latex Rash   Vancomycin Other (See Comments)    Pt can tolerate Vancomycin but did cause Vancomycin Infusion Reaction.  Recommend to pre-medicate with Benadryl before doses administered.      You were cared for by a hospitalist during your hospital stay. If you have any questions about your discharge medications or the care you received while you were in the hospital after you are discharged, you can call the unit and asked to speak with the hospitalist on call if the hospitalist that took care of you is not available. Once you are discharged, your primary care physician will handle any further medical issues. Please note that no refills for any discharge medications will be authorized once you are discharged, as it is imperative that you return to your primary care physician (or establish a relationship with a primary care physician if you do not have one) for your aftercare needs so that they can reassess your need for medications and monitor your lab values.   Procedures/Studies: DG Abd 1 View  Result Date: 01/28/2021 CLINICAL DATA:  Nausea and vomiting. EXAM: ABDOMEN - 1 VIEW COMPARISON:  January 26, 2021. FINDINGS: Suboptimal evaluation due to body habitus and incomplete assessment lateral soft tissues. Air and stool filled nondilated loops of bowel. Surgical clips project over the upper RIGHT abdomen. Degenerative changes of the lumbar spine. Revisualization of LEFT staghorn calculus, better assessed on recent CT. Multiple RIGHT-sided nephrolithiasis. Visualized lung bases are unremarkable. IMPRESSION: Nonobstructive bowel gas pattern. Electronically Signed   By:  Valentino Saxon M.D.   On: 01/28/2021 16:52   CT ABDOMEN PELVIS W CONTRAST  Result Date: 01/06/2021 CLINICAL DATA:  Diverticulitis suspected. Lower abdominal pain and back pain. EXAM: CT ABDOMEN AND PELVIS WITH CONTRAST TECHNIQUE: Multidetector CT imaging of the abdomen and pelvis was performed using the standard protocol following bolus administration of intravenous contrast. CONTRAST:  63mL OMNIPAQUE IOHEXOL 350  MG/ML SOLN COMPARISON:  CT abdomen and pelvis 10/24/2020. FINDINGS: Lower chest: No acute abnormality. Hepatobiliary: The liver is enlarged, unchanged. Patient is status post cholecystectomy. There is no biliary ductal dilatation. Pancreas: Unremarkable. No pancreatic ductal dilatation or surrounding inflammatory changes. Spleen: Normal in size without focal abnormality. Adrenals/Urinary Tract: Left-sided staghorn calculus appears unchanged from the prior examination. There is no left-sided hydronephrosis. Right renal calculi including a right renal pelvic calculus measuring 18 mm appear unchanged from the prior examination. There is no right-sided hydronephrosis. There is an 8.1 cm cyst in the superior pole of the right kidney which is unchanged from the prior examination. The bladder is within normal limits. Stomach/Bowel: Stomach is within normal limits. Appendix is not seen. No evidence of bowel wall thickening, distention, or inflammatory changes. There is a large amount of stool throughout the colon and within the rectum. Vascular/Lymphatic: No significant vascular findings are present. No enlarged abdominal or pelvic lymph nodes. Reproductive: Uterus and bilateral adnexa are unremarkable. Other: No abdominal wall hernia or abnormality. No abdominopelvic ascites. Musculoskeletal: Multilevel degenerative changes affect the spine. IMPRESSION: 1. No acute localizing process in the abdomen or pelvis. 2. Large amount of stool throughout the colon and within the rectum. 3. Stable bilateral renal  calculi including left-sided staghorn calculus. Electronically Signed   By: Ronney Asters M.D.   On: 01/06/2021 17:51   CT L-SPINE NO CHARGE  Result Date: 01/06/2021 CLINICAL DATA:  Midline low back pain. EXAM: CT LUMBAR SPINE WITH CONTRAST TECHNIQUE: Technique: Multiplanar CT images of the lumbar spine were reconstructed from contemporary CT of the Abdomen and Pelvis. CONTRAST:  No additional COMPARISON:  Lumbar spine MRI 12/12/2020 FINDINGS: Image quality is significantly degraded by body habitus. Segmentation: 5 lumbar type vertebrae. Alignment: Exaggerated lumbar lordosis. Unchanged grade 1 anterolisthesis of L5 on S1. unilateral chronic right-sided L5 pars defect. Vertebrae: No acute fracture is identified although assessment is limited by image noise. Paraspinal and other soft tissues: No acute abnormality identified in the paraspinal soft tissues. Intra-and pelvic contents reported separately. Disc levels: Disc degeneration throughout the lumbar and included lower thoracic spine including prominent degenerative endplate changes and vacuum disc at T12-L1. Moderate disc space narrowing at L4-5 and L5-S1 with vacuum disc. Poor visualization of the spinal canal due to study limitations precluding assessment of the L2-3 disc extrusion shown on the recent prior MRI. Severe facet arthrosis at L4-5 and L5-S1 with mild-to-moderate neural foraminal stenosis at L4-5 and severe neural foraminal stenosis at L5-S1. IMPRESSION: 1. Limited examination due to body habitus. No acute osseous abnormality identified. 2. Severe lower lumbar facet arthrosis with severe bilateral neural foraminal stenosis at L5-S1. Electronically Signed   By: Logan Bores M.D.   On: 01/06/2021 17:19   DG Chest Portable 1 View  Result Date: 01/25/2021 CLINICAL DATA:  Weakness EXAM: PORTABLE CHEST 1 VIEW COMPARISON:  10/30/2020 FINDINGS: Cardiac shadow is prominent but accentuated by the portable technique. Lungs are well aerated bilaterally.  Very mild vascular congestion is noted without edema. No infiltrate is seen. No bony abnormality is noted. IMPRESSION: Mild vascular congestion without edema. No other focal abnormality is seen. Electronically Signed   By: Inez Catalina M.D.   On: 01/25/2021 22:27   DG Knee 4 Views W/Patella Right  Result Date: 01/04/2021 CLINICAL DATA:  Knee pain EXAM: RIGHT KNEE - COMPLETE 4+ VIEW COMPARISON:  08/10/2020 FINDINGS: Negative for fracture. Evaluation of joint effusion limited by patient positioning Tricompartmental degenerative change with joint space narrowing and spurring. There  is widening of the medial joint space with narrowing of the lateral joint space. Bilateral spurring. Patellofemoral joint space narrowing and spurring. IMPRESSION: Negative for fracture. Advanced tricompartmental degenerative change. Electronically Signed   By: Franchot Gallo M.D.   On: 01/04/2021 14:26   CT Renal Stone Study  Result Date: 01/26/2021 CLINICAL DATA:  Bilateral flank pain EXAM: CT ABDOMEN AND PELVIS WITHOUT CONTRAST TECHNIQUE: Multidetector CT imaging of the abdomen and pelvis was performed following the standard protocol without IV contrast. COMPARISON:  01/14/2021 FINDINGS: Lower chest: No acute abnormality. Hepatobiliary: No focal liver abnormality is seen. Status post cholecystectomy. No biliary dilatation. Pancreas: Unremarkable. No pancreatic ductal dilatation or surrounding inflammatory changes. Spleen: Normal in size without focal abnormality. Adrenals/Urinary Tract: Adrenal glands are within normal limits. Left kidney again demonstrates staghorn type calculus which extends into the renal pelvis and towards the UPJ although no obstructive changes are seen. Left ureter is unremarkable. Right kidney demonstrates multiple nonobstructing renal calculi similar to that seen on prior exam. Large hypodense lesion is noted in the upper pole of the right kidney measuring up to 8.6 cm similar to that seen on the prior  exam in dating back to 2019. No other mass is seen. The right ureter is unremarkable. Bladder is decompressed. Some persistent bladder wall thickening is noted which may be related underlying infection. Stomach/Bowel: Colon shows no obstructive or inflammatory changes. The appendix is within normal limits. Small bowel and stomach are unremarkable. Vascular/Lymphatic: No significant vascular findings are present. No enlarged abdominal or pelvic lymph nodes. Reproductive: Uterus and bilateral adnexa are unremarkable. Other: No abdominal wall hernia or abnormality. No abdominopelvic ascites. Musculoskeletal: Degenerative changes of lumbar spine are again seen. IMPRESSION: Stable bilateral renal calculi.  No obstructive changes are seen. Stable hypodense lesion in the upper pole of the right kidney dating back to 2019 consistent with a complex cyst. Persistent bladder wall thickening which may represent an underlying UTI. Correlate with laboratory values. Electronically Signed   By: Inez Catalina M.D.   On: 01/26/2021 02:12     The results of significant diagnostics from this hospitalization (including imaging, microbiology, ancillary and laboratory) are listed below for reference.     Microbiology: Recent Results (from the past 240 hour(s))  Urine Culture     Status: Abnormal   Collection Time: 01/25/21 10:06 PM   Specimen: Urine, Clean Catch  Result Value Ref Range Status   Specimen Description URINE, CLEAN CATCH  Final   Special Requests   Final    NONE Performed at [facility], [address], [place], [place] [address]    Culture MULTIPLE SPECIES PRESENT, SUGGEST RECOLLECTION (A)  Final   Report Status 01/27/2021 FINAL  Final  Resp Panel by RT-PCR (Flu A&B, Covid) Nasopharyngeal Swab     Status: None   Collection Time: 01/25/21 11:03 PM   Specimen: Nasopharyngeal Swab; Nasopharyngeal(NP) swabs in vial transport medium  Result Value Ref Range Status   SARS Coronavirus 2 by RT PCR  NEGATIVE NEGATIVE Final    Comment: (NOTE) SARS-CoV-2 target nucleic acids are NOT DETECTED.  The SARS-CoV-2 RNA is generally detectable in upper respiratory specimens during the acute phase of infection. The lowest concentration of SARS-CoV-2 viral copies this assay can detect is 138 copies/mL. A negative result does not preclude SARS-Cov-2 infection and should not be used as the sole basis for treatment or other patient management decisions. A negative result may occur with  improper specimen collection/handling, submission of specimen other than  nasopharyngeal swab, presence of viral mutation(s) within the areas targeted by this assay, and inadequate number of viral copies(<138 copies/mL). A negative result must be combined with clinical observations, patient history, and epidemiological information. The expected result is Negative.  Fact Sheet for Patients:  EntrepreneurPulse.com.au  Fact Sheet for Healthcare Providers:  IncredibleEmployment.be  This test is no t yet approved or cleared by the [place] FDA and  has been authorized for detection and/or diagnosis of SARS-CoV-2 by FDA under an Emergency Use Authorization (EUA). This EUA will remain  in effect (meaning this test can be used) for the duration of the COVID-19 declaration under Section 564(b)(1) of the Act, 21 U.S.C.section 360bbb-3(b)(1), unless the authorization is terminated  or revoked sooner.       Influenza A by PCR NEGATIVE NEGATIVE Final   Influenza B by PCR NEGATIVE NEGATIVE Final    Comment: (NOTE) The Xpert Xpress SARS-CoV-2/FLU/RSV plus assay is intended as an aid in the diagnosis of influenza from Nasopharyngeal swab specimens and should not be used as a sole basis for treatment. Nasal washings and aspirates are unacceptable for Xpert Xpress SARS-CoV-2/FLU/RSV testing.  Fact Sheet for Patients: EntrepreneurPulse.com.au  Fact Sheet for  Healthcare Providers: IncredibleEmployment.be  This test is not yet approved or cleared by the [place] FDA and has been authorized for detection and/or diagnosis of SARS-CoV-2 by FDA under an Emergency Use Authorization (EUA). This EUA will remain in effect (meaning this test can be used) for the duration of the COVID-19 declaration under Section 564(b)(1) of the Act, 21 U.S.C. section 360bbb-3(b)(1), unless the authorization is terminated or revoked.  Performed at [facility], [place] [address] [place], [place] [address]   Blood Culture (routine x 2)     Status: None   Collection Time: 01/25/21 11:45 PM   Specimen: BLOOD RIGHT HAND  Result Value Ref Range Status   Specimen Description BLOOD RIGHT HAND  Final   Special Requests   Final    BOTTLES DRAWN AEROBIC AND ANAEROBIC Blood Culture adequate volume   Culture   Final    NO GROWTH 5 DAYS Performed at [facility], [place] [address], [place], [place] [address]    Report Status 01/31/2021 FINAL  Final  Blood Culture (routine x 2)     Status: None   Collection Time: 01/25/21 11:45 PM   Specimen: BLOOD LEFT WRIST  Result Value Ref Range Status   Specimen Description BLOOD LEFT WRIST  Final   Special Requests   Final    BOTTLES DRAWN AEROBIC AND ANAEROBIC Blood Culture adequate volume   Culture   Final    NO GROWTH 5 DAYS Performed at [facility], [place] [address], [place], [place] [address]    Report Status 01/31/2021 FINAL  Final     Labs: BNP (last 3 results) No results for input(s): BNP in the last 8760 hours. Basic Metabolic Panel: Recent Labs  Lab 01/27/21 0417 01/29/21 0443 01/31/21 0206 02/01/21 0440 02/02/21 0133  NA 137 139 136 134* 134*  K 3.2* 3.6 4.3 4.0 4.1  CL 107 106 104 103 104  CO2 24 25 24 25 25   GLUCOSE 88 98 88 90 102*  BUN 11 14 18  21* 23*  CREATININE 1.11* 1.28* 1.19* 1.28* 1.18*  CALCIUM 8.8* 8.9 9.3 9.1 8.9  MG  --   --   --  2.2 2.3   Liver  Function Tests: No results for input(s): AST, ALT, ALKPHOS, BILITOT, PROT,  ALBUMIN in the last 168 hours. No results for input(s): LIPASE, AMYLASE in the last 168 hours. No results for input(s): AMMONIA in the last 168 hours. CBC: Recent Labs  Lab 01/27/21 0417 01/31/21 0206 02/01/21 0440 02/02/21 0133  WBC 8.4 10.2 9.6 11.0*  HGB 10.6* 10.1* 10.2* 10.0*  HCT 35.2* 34.3* 34.4* 34.1*  MCV 84.6 86.2 85.1 85.0  PLT 318 336 344 369   Cardiac Enzymes: No results for input(s): CKTOTAL, CKMB, CKMBINDEX, TROPONINI in the last 168 hours. BNP: Invalid input(s): POCBNP CBG: No results for input(s): GLUCAP in the last 168 hours. D-Dimer No results for input(s): DDIMER in the last 72 hours. Hgb A1c No results for input(s): HGBA1C in the last 72 hours. Lipid Profile No results for input(s): CHOL, HDL, LDLCALC, TRIG, CHOLHDL, LDLDIRECT in the last 72 hours. Thyroid function studies No results for input(s): TSH, T4TOTAL, T3FREE, THYROIDAB in the last 72 hours.  Invalid input(s): FREET3 Anemia work up No results for input(s): VITAMINB12, FOLATE, FERRITIN, TIBC, IRON, RETICCTPCT in the last 72 hours. Urinalysis    Component Value Date/Time   COLORURINE AMBER (A) 01/25/2021 2206   APPEARANCEUR CLOUDY (A) 01/25/2021 2206   APPEARANCEUR Cloudy (A) 08/06/2016 1209   LABSPEC 1.014 01/25/2021 2206   PHURINE 6.0 01/25/2021 2206   GLUCOSEU NEGATIVE 01/25/2021 2206   HGBUR MODERATE (A) 01/25/2021 2206   HGBUR large 04/19/2008 0952   BILIRUBINUR NEGATIVE 01/25/2021 2206   BILIRUBINUR Negative 08/06/2016 1209   KETONESUR 20 (A) 01/25/2021 2206   PROTEINUR 100 (A) 01/25/2021 2206   UROBILINOGEN 0.2 12/06/2014 2049   NITRITE POSITIVE (A) 01/25/2021 2206   LEUKOCYTESUR LARGE (A) 01/25/2021 2206   Sepsis Labs Invalid input(s): PROCALCITONIN,  WBC,  LACTICIDVEN Microbiology Recent Results (from the past 240 hour(s))  Urine Culture     Status: Abnormal   Collection Time: 01/25/21 10:06 PM    Specimen: Urine, Clean Catch  Result Value Ref Range Status   Specimen Description URINE, CLEAN CATCH  Final   Special Requests   Final    NONE Performed at [facility], [place] [address] [place], [place] [address]    Culture MULTIPLE SPECIES PRESENT, SUGGEST RECOLLECTION (A)  Final   Report Status 01/27/2021 FINAL  Final  Resp Panel by RT-PCR (Flu A&B, Covid) Nasopharyngeal Swab     Status: None   Collection Time: 01/25/21 11:03 PM   Specimen: Nasopharyngeal Swab; Nasopharyngeal(NP) swabs in vial transport medium  Result Value Ref Range Status   SARS Coronavirus 2 by RT PCR NEGATIVE NEGATIVE Final    Comment: (NOTE) SARS-CoV-2 target nucleic acids are NOT DETECTED.  The SARS-CoV-2 RNA is generally detectable in upper respiratory specimens during the acute phase of infection. The lowest concentration of SARS-CoV-2 viral copies this assay can detect is 138 copies/mL. A negative result does not preclude SARS-Cov-2 infection and should not be used as the sole basis for treatment or other patient management decisions. A negative result may occur with  improper specimen collection/handling, submission of specimen other than nasopharyngeal swab, presence of viral mutation(s) within the areas targeted by this assay, and inadequate number of viral copies(<138 copies/mL). A negative result must be combined with clinical observations, patient history, and epidemiological information. The expected result is Negative.  Fact Sheet for Patients:  EntrepreneurPulse.com.au  Fact Sheet for Healthcare Providers:  IncredibleEmployment.be  This test is no t yet approved or cleared by the [place] FDA and  has been authorized for detection and/or diagnosis of SARS-CoV-2 by  FDA under an Emergency Use Authorization (EUA). This EUA will remain  in effect (meaning this test can be used) for the duration of the COVID-19 declaration under Section 564(b)(1) of  the Act, 21 U.S.C.section 360bbb-3(b)(1), unless the authorization is terminated  or revoked sooner.       Influenza A by PCR NEGATIVE NEGATIVE Final   Influenza B by PCR NEGATIVE NEGATIVE Final    Comment: (NOTE) The Xpert Xpress SARS-CoV-2/FLU/RSV plus assay is intended as an aid in the diagnosis of influenza from Nasopharyngeal swab specimens and should not be used as a sole basis for treatment. Nasal washings and aspirates are unacceptable for Xpert Xpress SARS-CoV-2/FLU/RSV testing.  Fact Sheet for Patients: EntrepreneurPulse.com.au  Fact Sheet for Healthcare Providers: IncredibleEmployment.be  This test is not yet approved or cleared by the [place] FDA and has been authorized for detection and/or diagnosis of SARS-CoV-2 by FDA under an Emergency Use Authorization (EUA). This EUA will remain in effect (meaning this test can be used) for the duration of the COVID-19 declaration under Section 564(b)(1) of the Act, 21 U.S.C. section 360bbb-3(b)(1), unless the authorization is terminated or revoked.  Performed at [facility], [place] [address], [place], [place] [address]   Blood Culture (routine x 2)     Status: None   Collection Time: 01/25/21 11:45 PM   Specimen: BLOOD RIGHT HAND  Result Value Ref Range Status   Specimen Description BLOOD RIGHT HAND  Final   Special Requests   Final    BOTTLES DRAWN AEROBIC AND ANAEROBIC Blood Culture adequate volume   Culture   Final    NO GROWTH 5 DAYS Performed at [facility], [place] [address], [place], [place] [address]    Report Status 01/31/2021 FINAL  Final  Blood Culture (routine x 2)     Status: None   Collection Time: 01/25/21 11:45 PM   Specimen: BLOOD LEFT WRIST  Result Value Ref Range Status   Specimen Description BLOOD LEFT WRIST  Final   Special Requests   Final    BOTTLES DRAWN AEROBIC AND ANAEROBIC Blood Culture adequate volume   Culture   Final    NO GROWTH 5  DAYS Performed at [facility], [place] [address], [place], [place] [address]    Report Status 01/31/2021 FINAL  Final     Time coordinating discharge:  I have spent 35 minutes face to face with the patient and on the ward discussing the patients care, assessment, plan and disposition with other care givers. >50% of the time was devoted counseling the patient about the risks and benefits of treatment/Discharge disposition and coordinating care.   SIGNED:   Damita Lack, MD  [facility] 02/02/2021, 1:02 PM   If 7PM-7AM, please contact night-coverage

## 2021-02-02 NOTE — TOC Progression Note (Addendum)
Transition of Care [facility]) - Progression Note    Patient Details  Name: Heather [name] MRN: [id number] Date of Birth: Feb 20, 1976  [facility]) CM/SW Contact  Heather Salina Mila Homer, LCSW Phone Number: 02/02/2021, 2:28 PM  Clinical Narrative:  Visited with patient this morning regarding her discharge. Heather [name] was informed that she would not be able to discharge to a nursing facility for LTC as she does not currently have any income that would be needed for her patient liability at any facility and this was discussed. Patient was provided with shelter resources for [place] and surrounding counties and also [place] and informed that she could make contact with resources of her choice to determine shelter availability and this was discussed. Patient advised that she could go to the [employer]. This was suggested that they also could provide patient with shelter/housing resources. Patient aware that her major barrier is she has no income.  Initially Heather [name] reported that her mother would be unable to pick her up from the hospital as she has no money to get gas, however she later informed her nurse that her mother would be coming to get her at 5 pm.    CSW informed by nurse (2:58 pm) that patient mother called and informed her that she can't come get her daughter, does not want her staying in her car. Visited with patient regarding change in plans and requested that she call shelters.  CSW contacted [employer] and left a message for Heather [name], case Freight forwarder at [employer]. [place] and was unable to speak with anyone. Called [employer] of [place] and message let. Contacted [facility] and was informed that no beds available this evening.  4:01 pm: Called patient's mother and she explained that she lives with people from the church she attends and her daughter can't stay there. She added that she can't let Heather [name] sleep in  her car.    PASRR evaluator came to hospital this afternoon and talked with patient. She followed up with CSW regarding patient's discharge plan and the possibility of SNF/ALF placement. She was advised that patient has no income for patient liability at a SNF or ALF.  Expected Discharge Plan: Long Term Nursing Home Barriers to Discharge: Other (must enter comment) (Will need new PASRR number, faclity search has to be initiated for LTC bed ar nursing facility.)  Expected Discharge Plan and Services Expected Discharge Plan: Long Term Nursing Home In-house Referral: Clinical Social Work   Post Acute Care Choice: Nursing Home (Patient needs LTC bed) Living arrangements for the past 2 months: Homeless Expected Discharge Date: 02/02/21                                     Social Determinants of Health (SDOH) Interventions  Patient is homeless. Was provided with shelter resources.  Readmission Risk Interventions Readmission Risk Prevention Plan 12/08/2020  Transportation Screening Complete  Medication Review Press photographer) Complete  PCP or Specialist appointment within 3-5 days of discharge Complete  HRI or [place] Complete  SW Recovery Care/Counseling Consult Complete  [place] Not Applicable  Some recent data might be hidden

## 2021-02-02 NOTE — Plan of Care (Signed)
  Problem: Health Behavior/Discharge Planning: Goal: Ability to manage health-related needs will improve Outcome: Progressing   Problem: Clinical Measurements: Goal: Will remain free from infection Outcome: Progressing   Problem: Coping: Goal: Level of anxiety will decrease Outcome: Progressing   Problem: Safety: Goal: Ability to remain free from injury will improve Outcome: Progressing

## 2021-02-03 LAB — BASIC METABOLIC PANEL
Anion gap: 6 (ref 5–15)
BUN: 24 mg/dL — ABNORMAL HIGH (ref 6–20)
CO2: 25 mmol/L (ref 22–32)
Calcium: 9 mg/dL (ref 8.9–10.3)
Chloride: 105 mmol/L (ref 98–111)
Creatinine, Ser: 1.19 mg/dL — ABNORMAL HIGH (ref 0.44–1.00)
GFR, Estimated: 58 mL/min — ABNORMAL LOW (ref >60)
Glucose, Bld: 87 mg/dL (ref 70–99)
Potassium: 4.1 mmol/L (ref 3.5–5.1)
Sodium: 136 mmol/L (ref 135–145)

## 2021-02-03 LAB — CBC
HCT: 36.3 % (ref 36.0–46.0)
Hemoglobin: 10.5 g/dL — ABNORMAL LOW (ref 12.0–15.0)
MCH: 24.9 pg — ABNORMAL LOW (ref 26.0–34.0)
MCHC: 28.9 g/dL — ABNORMAL LOW (ref 30.0–36.0)
MCV: 86 fL (ref 80.0–100.0)
Platelets: 356 10*3/uL (ref 150–400)
RBC: 4.22 MIL/uL (ref 3.87–5.11)
RDW: 18.4 % — ABNORMAL HIGH (ref 11.5–15.5)
WBC: 9.8 10*3/uL (ref 4.0–10.5)
nRBC: 0 % (ref 0.0–0.2)

## 2021-02-03 LAB — MAGNESIUM: Magnesium: 2.3 mg/dL (ref 1.7–2.4)

## 2021-02-03 MED ORDER — OXYCODONE HCL 5 MG PO TABS
5.0000 mg | ORAL_TABLET | ORAL | Status: DC | PRN
  Administered 2021-02-03: 5 mg via ORAL
  Filled 2021-02-03: qty 1

## 2021-02-03 NOTE — Discharge Summary (Signed)
Discharge summary completed on 02/03/2011.  Labeled as progress note.  No new issues overnight, remains medically stable for discharge.  Currently awaiting placement.  Vital signs are stable.  I have seen and examined at bedside today, there is no further change.  All the questions have been answered for her.  Gerlean Ren MD [facility]

## 2021-02-03 NOTE — Evaluation (Signed)
Occupational Therapy Evaluation Patient Details Name: Heather [name] MRN: [id number] DOB: 11-02-1975 Today's Date: 02/03/2021   History of Present Illness Pt is a 45 y.o. F who presents 01/25/2021 with a UTI. Significant PMH: COPD, anxiety/depression/schizoaffective disorder, DM, morbid obesity, chronic pain syndrome, OSA   Clinical Impression   Pt admitted with above. She demonstrates the below listed deficits and will benefit from continued OT to maximize safety and independence with BADLs.  Pt presents to OT with generalized weakness, decreased activity tolerance, Lt LE pain.  She currently requires set up for UB ADLs and mod A for LB ADLs and peri care.  Pt was recently discharged from hospital and received extensive OT during that stay.  At time of discharge pt was functioning at set-up assist/supervision using adaptive equipment for ADLs.  She now reports she is unsure where her equipment is and reports she will check with her mother to see if she has it.   She reports she has been living in hotels [place].   Recommend LTC vs ALF at discharge.        Recommendations for follow up therapy are one component of a multi-disciplinary discharge planning process, led by the attending physician.  Recommendations may be updated based on patient status, additional functional criteria and insurance authorization.   Follow Up Recommendations       Assistance Recommended at Discharge Intermittent Supervision/Assistance  Functional Status Assessment  Patient has had a recent decline in their functional status and demonstrates the ability to make significant improvements in function in a reasonable and predictable amount of time.  Equipment Recommendations       Recommendations for Other Services       Precautions / Restrictions Precautions Precautions: Fall Precaution Comments: fell prior to hosp      Mobility Bed Mobility Overal bed mobility: Needs Assistance Bed Mobility: Supine to  Sit;Sit to Supine     Supine to sit: Modified independent (Device/Increase time);HOB elevated Sit to supine: Supervision        Transfers                          Balance     Sitting balance-Leahy Scale: Good     Standing balance support: Single extremity supported Standing balance-Leahy Scale: Poor                             ADL either performed or assessed with clinical judgement   ADL Overall ADL's : Needs assistance/impaired Eating/Feeding: Independent;Bed level   Grooming: Wash/dry hands;Wash/dry face;Oral care;Brushing hair;Min guard;Standing   Upper Body Bathing: Set up Upper Body Bathing Details (indicate cue type and reason): assist for her back Lower Body Bathing: Moderate assistance;Sit to/from stand Lower Body Bathing Details (indicate cue type and reason): unable to access LE legs and feet adn demonstrates difficulty accessing peri area.  She reports her toileting tongs don't work well due to her body habitus.  She does report that if she lies supine, she is better able to access peri area Upper Body Dressing : Set up;Sitting   Lower Body Dressing: Moderate assistance;Sit to/from stand Lower Body Dressing Details (indicate cue type and reason): Pt reports she typically uses AE, but is unsure where her AE is currently located Toilet Transfer: Supervision/safety;Stand-pivot;BSC   Toileting- Water quality scientist and Hygiene: Maximal assistance;Sit to/from stand Toileting - Clothing Manipulation Details (indicate cue type and reason): unable to access peri area  due to body habitus and is unable to abduct hips this date for better access due to pain Lt LE             Vision         Perception     Praxis      Pertinent Vitals/Pain Pain Assessment: 0-10 Pain Score: 6  Pain Location: Lt LE Pain Descriptors / Indicators: Grimacing;Guarding Pain Intervention(s): Limited activity within patient's tolerance;Monitored during  session;Repositioned     Hand Dominance Left   Extremity/Trunk Assessment Upper Extremity Assessment Upper Extremity Assessment: Overall WFL for tasks assessed   Lower Extremity Assessment Lower Extremity Assessment: Defer to PT evaluation   Cervical / Trunk Assessment Cervical / Trunk Assessment: Other exceptions Cervical / Trunk Exceptions: increased body habitus   Communication Communication Communication: No difficulties   Cognition Arousal/Alertness: Awake/alert Behavior During Therapy: WFL for tasks assessed/performed Overall Cognitive Status: No family/caregiver present to determine baseline cognitive functioning                                 General Comments: Pt tearful throughout session.  She demonstrates difficulty with problem solving and is often distracted by pain.  Anticipate this is her baseline, and depression may be impacting cognition     General Comments       Exercises     Shoulder Instructions      Home Living Family/patient expects to be discharged to:: Shelter/Homeless Living Arrangements: Alone                               Additional Comments: Pt reports she has been living in [place] since she lost her apartment last spring      Prior Functioning/Environment Prior Level of Function : Independent/Modified Independent             Mobility Comments: pt reports she ambulated with RW ADLs Comments: Pt reports she was performing ADLs with use of AE, and was using toileting tongs, which were not very effective for peri care.  She reports having to lie down on the bed to access peri area,. She reports she thinks her mother has her AE        OT Problem List: Decreased strength;Decreased activity tolerance;Impaired balance (sitting and/or standing);Decreased knowledge of use of DME or AE;Obesity;Pain;Decreased safety awareness      OT Treatment/Interventions: Self-care/ADL training;Therapeutic exercise;Energy  conservation;DME and/or AE instruction;Therapeutic activities;Patient/family education;Balance training    OT Goals(Current goals can be found in the care plan section) Acute Rehab OT Goals Patient Stated Goal: to find a place to live OT Goal Formulation: With patient Time For Goal Achievement: 02/16/21 Potential to Achieve Goals: Good ADL Goals Pt Will Perform Grooming: with supervision;standing Pt Will Perform Lower Body Bathing: with set-up;sit to/from stand;with adaptive equipment Pt Will Perform Upper Body Dressing: with set-up;sitting Pt Will Perform Lower Body Dressing: with supervision;sit to/from stand;with adaptive equipment;with set-up Pt Will Perform Toileting - Clothing Manipulation and hygiene: with supervision;sit to/from stand;with adaptive equipment Pt/caregiver will Perform Home Exercise Program: Increased strength;Right Upper extremity;Left upper extremity;With written HEP provided;With Supervision  OT Frequency: Min 2X/week   Barriers to D/C: Other (comment) (homeless)          Co-evaluation              AM-PAC OT "6 Clicks" Daily Activity     Outcome Measure Help from  another person eating meals?: None Help from another person taking care of personal grooming?: A Little Help from another person toileting, which includes using toliet, bedpan, or urinal?: A Lot Help from another person bathing (including washing, rinsing, drying)?: A Lot Help from another person to put on and taking off regular upper body clothing?: A Little Help from another person to put on and taking off regular lower body clothing?: A Lot 6 Click Score: 16   End of Session Equipment Utilized During Treatment: Rolling walker (2 wheels) Nurse Communication: Mobility status  Activity Tolerance: Patient limited by pain Patient left: in bed;with call bell/phone within reach  OT Visit Diagnosis: Unsteadiness on feet (R26.81);Other abnormalities of gait and mobility (R26.89);Muscle weakness  (generalized) (M62.81);Pain;Other (comment) Pain - Right/Left: Left Pain - part of body: Leg                Time: 7062-3762 OT Time Calculation (min): 35 min Charges:  OT General Charges $OT Visit: 1 Visit OT Evaluation $OT Eval Moderate Complexity: 1 Mod OT Treatments $Self Care/Home Management : 8-22 mins  Nilsa Nutting., OTR/L Acute Rehabilitation Services Pager [phone] Office [place], [place] 02/03/2021, 2:55 PM

## 2021-02-03 NOTE — TOC Transition Note (Signed)
Transition of Care [facility]) - CM/SW Discharge Note   Patient Details  Name: Heather [name] MRN: [id number] Date of Birth: 1975-07-28  [facility] CM/SW Contact:  Coralee Pesa, [place] Phone Number: 02/03/2021, 4:41 PM   Clinical Narrative:    CSW was notified that pt has been discharged. Pt has been here on multiple admissions, she is unable to be placed. TOC leadership has notified CSW that pt will need to discharge with resources, as PT has not specified a need for any continuing placement or therapy needs. Pt has previously been on the DTP caseload and has not been able to be placed.  CSW spoke with pt at bedside and her mother on speaker. CSW explained that at this time, pt has no placement needs and has been medically cleared. Pt became very upset, raising her voice, to the point that a NT came in to attempt to de escalate. Pt produced paperwork that she stated meant she could not be discharged until she was placed. CSW reviewed the paperwork, it was a PASSR determination sheet stating she had gone to a level 2 and they had referred her to [facility] for placement assistance. TOC spoke with supervisor who had also not heard of [facility] and stated pt could have until 10 am to leave. Pt stating she wants to be transferred to another hospital, CSW advised that she had been discharged, and she had the right to follow up at another facility for a second opinion, a bus pass was provided. Pt's mother also speaking over CSW and stating repeatedly she can't leave, you don't have to leave. CSW asked if pt could stay with mother or another family or friend. Mother stated she had no one she could stay with. CSW encouraged family to utilize resources and attempt to find an acquaintance she can stay with. Pt continuing to state "you are just doing this, because it is the weekend. You know you aren't allowed to". CSW advised that Leadership has been involved as well and pt has been followed for  placement, but there isn't anything more the hospital can do, as we are a medical facility and pt has no medical or placement needs. Pt and mother continuing to speak over CSW and have escalated speech. CSW cut meeting short and left resources at bedside. CSW notified leadership of the situation.  Pt will need to leave by 10 am tomorrow. TOC will continue to follow.   Final next level of care: Long Term Nursing Home Barriers to Discharge: Financial Resources, Other (must enter comment) (No shelter beds available)   Patient Goals and CMS Choice Patient states their goals for this hospitalization and ongoing recovery are:: Patient has no income and no where to live CMS Medicare.gov Compare Post Acute Care list provided to::  (Will be provided to patient based on bed offers received)    Discharge Placement                       Discharge Plan and Services In-house Referral: Clinical Social Work   Post Acute Care Choice: Nursing Home (Patient needs LTC bed)                               Social Determinants of Health (SDOH) Interventions     Readmission Risk Interventions Readmission Risk Prevention Plan 12/08/2020  Transportation Screening Complete  Medication Review Press photographer) Complete  PCP  or Specialist appointment within 3-5 days of discharge Complete  HRI or [place] Complete  SW Recovery Care/Counseling Consult Complete  [place] Not Applicable  Some recent data might be hidden

## 2021-02-04 ENCOUNTER — Encounter (HOSPITAL_COMMUNITY): Payer: Self-pay | Admitting: Emergency Medicine

## 2021-02-04 ENCOUNTER — Encounter (HOSPITAL_COMMUNITY): Payer: Self-pay

## 2021-02-04 ENCOUNTER — Emergency Department (HOSPITAL_COMMUNITY)
Admission: EM | Admit: 2021-02-04 | Discharge: 2021-02-04 | Disposition: A | Discharge status: Home / Self Care | Payer: 59 | Attending: Emergency Medicine | Admitting: Emergency Medicine

## 2021-02-04 DIAGNOSIS — Z7951 Long term (current) use of inhaled steroids: Secondary | ICD-10-CM | POA: Insufficient documentation

## 2021-02-04 DIAGNOSIS — Z87891 Personal history of nicotine dependence: Secondary | ICD-10-CM | POA: Insufficient documentation

## 2021-02-04 DIAGNOSIS — I129 Hypertensive chronic kidney disease with stage 1 through stage 4 chronic kidney disease, or unspecified chronic kidney disease: Secondary | ICD-10-CM | POA: Insufficient documentation

## 2021-02-04 DIAGNOSIS — N183 Chronic kidney disease, stage 3 unspecified: Secondary | ICD-10-CM | POA: Insufficient documentation

## 2021-02-04 DIAGNOSIS — J45909 Unspecified asthma, uncomplicated: Secondary | ICD-10-CM | POA: Insufficient documentation

## 2021-02-04 DIAGNOSIS — E1122 Type 2 diabetes mellitus with diabetic chronic kidney disease: Secondary | ICD-10-CM | POA: Insufficient documentation

## 2021-02-04 DIAGNOSIS — D72829 Elevated white blood cell count, unspecified: Secondary | ICD-10-CM | POA: Insufficient documentation

## 2021-02-04 DIAGNOSIS — J449 Chronic obstructive pulmonary disease, unspecified: Secondary | ICD-10-CM | POA: Insufficient documentation

## 2021-02-04 DIAGNOSIS — Z5902 Unsheltered homelessness: Secondary | ICD-10-CM | POA: Insufficient documentation

## 2021-02-04 DIAGNOSIS — R110 Nausea: Secondary | ICD-10-CM | POA: Insufficient documentation

## 2021-02-04 DIAGNOSIS — Z91040 Latex allergy status: Secondary | ICD-10-CM | POA: Insufficient documentation

## 2021-02-04 DIAGNOSIS — E669 Obesity, unspecified: Secondary | ICD-10-CM | POA: Insufficient documentation

## 2021-02-04 DIAGNOSIS — Z7984 Long term (current) use of oral hypoglycemic drugs: Secondary | ICD-10-CM | POA: Insufficient documentation

## 2021-02-04 DIAGNOSIS — F32A Depression, unspecified: Secondary | ICD-10-CM | POA: Insufficient documentation

## 2021-02-04 DIAGNOSIS — J45901 Unspecified asthma with (acute) exacerbation: Secondary | ICD-10-CM | POA: Insufficient documentation

## 2021-02-04 DIAGNOSIS — R197 Diarrhea, unspecified: Secondary | ICD-10-CM | POA: Diagnosis not present

## 2021-02-04 DIAGNOSIS — R262 Difficulty in walking, not elsewhere classified: Secondary | ICD-10-CM | POA: Insufficient documentation

## 2021-02-04 DIAGNOSIS — J4530 Mild persistent asthma, uncomplicated: Secondary | ICD-10-CM | POA: Insufficient documentation

## 2021-02-04 DIAGNOSIS — Z5900 Homelessness unspecified: Secondary | ICD-10-CM

## 2021-02-04 DIAGNOSIS — R7989 Other specified abnormal findings of blood chemistry: Secondary | ICD-10-CM | POA: Insufficient documentation

## 2021-02-04 DIAGNOSIS — Z79899 Other long term (current) drug therapy: Secondary | ICD-10-CM | POA: Insufficient documentation

## 2021-02-04 DIAGNOSIS — Z85038 Personal history of other malignant neoplasm of large intestine: Secondary | ICD-10-CM | POA: Insufficient documentation

## 2021-02-04 LAB — MAGNESIUM: Magnesium: 2.3 mg/dL (ref 1.7–2.4)

## 2021-02-04 LAB — BASIC METABOLIC PANEL
Anion gap: 9 (ref 5–15)
BUN: 27 mg/dL — ABNORMAL HIGH (ref 6–20)
CO2: 22 mmol/L (ref 22–32)
Calcium: 9.2 mg/dL (ref 8.9–10.3)
Chloride: 107 mmol/L (ref 98–111)
Creatinine, Ser: 1.13 mg/dL — ABNORMAL HIGH (ref 0.44–1.00)
GFR, Estimated: >60 mL/min (ref >60)
Glucose, Bld: 79 mg/dL (ref 70–99)
Potassium: 4 mmol/L (ref 3.5–5.1)
Sodium: 138 mmol/L (ref 135–145)

## 2021-02-04 LAB — CBC
HCT: 37.1 % (ref 36.0–46.0)
Hemoglobin: 10.9 g/dL — ABNORMAL LOW (ref 12.0–15.0)
MCH: 25.3 pg — ABNORMAL LOW (ref 26.0–34.0)
MCHC: 29.4 g/dL — ABNORMAL LOW (ref 30.0–36.0)
MCV: 86.3 fL (ref 80.0–100.0)
Platelets: 336 10*3/uL (ref 150–400)
RBC: 4.3 MIL/uL (ref 3.87–5.11)
RDW: 18.4 % — ABNORMAL HIGH (ref 11.5–15.5)
WBC: 11.3 10*3/uL — ABNORMAL HIGH (ref 4.0–10.5)
nRBC: 0 % (ref 0.0–0.2)

## 2021-02-04 NOTE — ED Provider Notes (Signed)
[facility] DEPT Provider Note   CSN: [id number] Arrival date & time: 02/04/21  2151     History No chief complaint on file.   Heather [name] is a 45 y.o. female.  HPI Patient presents for the second time to this facility today, same day as being discharged from our affiliated facility.  Please see my note from earlier today for her recent medical history, summary of discharge plan, and recent events.  In essence after being seen, evaluated earlier today, after discharge, she declined assistance for transportation, seemingly left, and while sitting at the bus stop after an argument with her mother became upset.  When she was evaluated by EMS responder she described back pain, and was brought here for evaluation.  She does describe ongoing depression over her life situation as well.  This is unchanged.  She has not yet had a chance for outpatient follow-up since discharge earlier today from our affiliated facility.  She does state that she is interested in going to rehabilitation and may have access to her facility tomorrow.  No new complaints physically, no report of new fever.    Past Medical History:  Diagnosis Date   Anxiety    Arthritis    Asthma    Back pain    Constipation    COPD (chronic obstructive pulmonary disease) ([place])    Depression    Diabetes mellitus without complication ([place])    patient denies but states she has hyperglycemia-diet controlled   Dysrhythmia    DR Johnsie Cancel     Ectopic pregnancy 5916   Eosinophilic esophagitis    Diagnosed at [facility]16/2013, untreated   GERD (gastroesophageal reflux disease)    HEARTBURN   TUMS   High cholesterol    IBS (irritable bowel syndrome)    Leukocytosis 07/28/2008   Qualifier: Diagnosis of  By: Jonna Munro MD, Cornelius     Morbid obesity [facility])    Neuromuscular disorder ([place])    RESTLESS LEG    Obesity    Schizoaffective disorder, bipolar type ([place])    Sleep apnea    CPAP- in  process of restarting     Patient Active Problem List   Diagnosis Date Noted   Cystitis 01/26/2021   Social problem 01/26/2021   Enterococcus faecalis infection 01/26/2021   Mood disorder ([place]) 01/26/2021   Chronic pain disorder 01/26/2021   Thrombocytosis 10/31/2020   CKD (chronic kidney disease), stage III ([place]) 10/31/2020   Major depressive disorder, recurrent ([place]) 09/13/2020   Insomnia    Anemia of chronic disease    Chronic bilateral low back pain without sciatica    Debility 08/24/2020   SIRS (systemic inflammatory response syndrome) ([place]) 08/18/2020   Pressure injury of skin 08/11/2020   Rhabdomyolysis 08/10/2020   Acute renal failure ([place]) 08/10/2020   PTSD (post-traumatic stress disorder) 05/15/2020   Major depressive disorder, recurrent episode, moderate ([place]) 05/05/2020   Generalized anxiety disorder 05/05/2020   Blood in stool    Gastritis and gastroduodenitis    Benign neoplasm of sigmoid colon    Class 3 severe obesity with serious comorbidity and body mass index (BMI) greater than or equal to 70 in adult ([place]) 06/16/2018   Nexplanon insertion 03/27/2017   Postpartum hypertension 03/05/2017   Perennial allergic rhinitis 11/05/2016   Mild persistent asthma with acute exacerbation 11/05/2016   Hypoglycemia 11/07/2015   OSA (obstructive sleep apnea) 11/07/2015   DM type 2 (diabetes mellitus, type 2) ([place]) 12/07/2014   Panniculitis 12/06/2014  Cellulitis, abdominal wall 11/11/2014   Abdominal pain 65/68/1275   Eosinophilic esophagitis 17/00/1749   Change in bowel habits 04/28/2013   RLS (restless legs syndrome) 08/08/2011   Adjustment disorder 44/96/7591   DYSMETABOLIC SYNDROME 63/84/6659   Essential hypertension 05/12/2006   Asthma 05/12/2006   OSTEOARTHRITIS 05/12/2006    Past Surgical History:  Procedure Laterality Date   BIOPSY  08/13/2018   Procedure: BIOPSY;  Surgeon: Yetta Flock, MD;  Location: [facility];  Service: Gastroenterology;;    CESAREAN SECTION MULTI-GESTATIONAL N/A 02/03/2017   Procedure: [place];  Surgeon: Jonnie Kind, MD;  Location: [facility];  Service: Obstetrics;  Laterality: N/A;   CHOLECYSTECTOMY     COLONOSCOPY WITH PROPOFOL N/A 08/13/2018   Procedure: COLONOSCOPY WITH PROPOFOL;  Surgeon: Yetta Flock, MD;  Location: [facility];  Service: Gastroenterology;  Laterality: N/A;   DENTAL SURGERY     ESOPHAGOGASTRODUODENOSCOPY  May 2007   Dr. Gala Romney: Normal esophagus, stomach, D1, D2   ESOPHAGOGASTRODUODENOSCOPY  06/16/2013   Dr. Carlton Adam, eosinophilic esophagitis, reactive gastropathy, no esophageal dilation   ESOPHAGOGASTRODUODENOSCOPY (EGD) WITH PROPOFOL N/A 08/13/2018   Procedure: ESOPHAGOGASTRODUODENOSCOPY (EGD) WITH PROPOFOL;  Surgeon: Yetta Flock, MD;  Location: [facility];  Service: Gastroenterology;  Laterality: N/A;   POLYPECTOMY  08/13/2018   Procedure: POLYPECTOMY;  Surgeon: Yetta Flock, MD;  Location: [facility];  Service: Gastroenterology;;   TONSILLECTOMY     TOOTH EXTRACTION  10/28/2011   Procedure: DENTAL RESTORATION/EXTRACTIONS;  Surgeon: Gae Bon, DDS;  Location: [facility] OR;  Service: Oral Surgery;;   UPPER GASTROINTESTINAL ENDOSCOPY       OB History     Gravida  2   Para  1   Term  1   Preterm      AB  1   Living  2      SAB  0   IAB      Ectopic  1   Multiple  1   Live Births  2           Family History  Problem Relation Age of Onset   Depression Mother    Anxiety disorder Mother    High blood pressure Mother    Bipolar disorder Mother    Eating disorder Mother    Obesity Mother    Hypertension Sister    Allergic rhinitis Sister    Colon polyps Maternal Grandmother        93s   Diabetes Maternal Grandmother    Anxiety disorder Maternal Grandmother    COPD Maternal Grandmother    Crohn's disease Maternal Aunt    Cancer Maternal Grandfather        prostate   HIV/AIDS Father    Eating  disorder Father    Obesity Father    Liver disease Neg Hx    Angioedema Neg Hx    Eczema Neg Hx    Immunodeficiency Neg Hx    Asthma Neg Hx    Urticaria Neg Hx    Colon cancer Neg Hx    Esophageal cancer Neg Hx    Rectal cancer Neg Hx    Stomach cancer Neg Hx     Social History   Tobacco Use   Smoking status: Former    Packs/day: 0.50    Years: 8.00    Pack years: 4.00    Types: Cigarettes    Quit date: 04/25/2011    Years since quitting: 9.7   Smokeless tobacco: Never  Vaping Use  Vaping Use: Never used  Substance Use Topics   Alcohol use: Not Currently    Comment: occasional "relapse" of a wine cooler   Drug use: Not Currently    Types: Marijuana    Home Medications Prior to Admission medications   Medication Sig Start Date End Date Taking? Authorizing Provider  albuterol (VENTOLIN HFA) 108 (90 Base) MCG/ACT inhaler Inhale 2 puffs into the lungs every 4 (four) hours as needed for wheezing or shortness of breath. 12/27/20   Samella Parr, NP  ascorbic acid (VITAMIN C) 500 MG tablet Take 1 tablet (500 mg total) by mouth daily. 12/27/20   Samella Parr, NP  buPROPion (WELLBUTRIN XL) 300 MG 24 hr tablet Take 1 tablet (300 mg total) by mouth daily. 01/29/21   Patrecia Pour, NP  clonazePAM (KLONOPIN) 0.5 MG tablet Take 1/2 tablet (0.25 mg total) by mouth daily as needed (panic attacks). 01/04/21   Samella Parr, NP  clotrimazole (LOTRIMIN) 1 % cream Apply topically 2 (two) times daily. 11/02/20   Shelly Coss, MD  diclofenac Sodium (VOLTAREN) 1 % GEL Apply 4 g topically 4 (four) times daily. 12/27/20   Samella Parr, NP  fluticasone (FLONASE) 50 MCG/ACT nasal spray Place 2 sprays into both nostrils 2 (two) times daily. Patient taking differently: Place 2 sprays into both nostrils 2 (two) times daily as needed for allergies or rhinitis. 12/27/20   Samella Parr, NP  furosemide (LASIX) 40 MG tablet Take 1 tablet (40 mg total) by mouth daily. 01/04/21   Samella Parr, NP  methocarbamol (ROBAXIN) 500 MG tablet Take 1 tablet (500 mg total) by mouth 4 (four) times daily. 12/27/20   Samella Parr, NP  mometasone-formoterol (DULERA) 100-5 MCG/ACT AERO Inhale 2 puffs into the lungs 2 (two) times daily. 12/27/20   Samella Parr, NP  nystatin (MYCOSTATIN/NYSTOP) powder Apply topically 3 (three) times daily. 12/27/20   Samella Parr, NP  oxyCODONE (OXY IR/ROXICODONE) 5 MG immediate release tablet Take 1 tablet (5 mg total) by mouth every 4 (four) hours as needed for moderate pain. 12/27/20   Samella Parr, NP  potassium chloride SA (KLOR-CON) 20 MEQ tablet Take 1 tablet (20 mEq total) by mouth daily. 01/04/21   Samella Parr, NP  pregabalin (LYRICA) 100 MG capsule Take 1 capsule (100 mg total) by mouth 3 (three) times daily. 12/27/20   Samella Parr, NP  rOPINIRole (REQUIP) 4 MG tablet Take 1 tablet (4 mg total) by mouth at bedtime. 12/27/20   Samella Parr, NP  senna-docusate (SENOKOT-S) 8.6-50 MG tablet Take 2 tablets by mouth daily. 12/27/20   Samella Parr, NP  silver sulfADIAZINE (SILVADENE) 1 % cream Apply topically daily. Patient not taking: Reported on 01/26/2021 01/04/21   Samella Parr, NP  topiramate (TOPAMAX) 25 MG tablet Take 1 tablet (25 mg total) by mouth 2 (two) times daily. 12/27/20   Samella Parr, NP  traZODone (DESYREL) 50 MG tablet Take 1 tablet (50 mg total) by mouth at bedtime. 01/29/21   Patrecia Pour, NP  vitamin B-12 (CYANOCOBALAMIN) 1000 MCG tablet Take 1 tablet (1,000 mcg total) by mouth daily. 12/27/20   Samella Parr, NP  ziprasidone (GEODON) 20 MG capsule Take 1 capsule (20 mg total) by mouth daily. 01/29/21   Patrecia Pour, NP    Allergies    Amoxicillin, Bee venom, Penicillin g, Penicillins, Adhesive [tape], Latex, and Vancomycin  Review of Systems   Review  of Systems  Constitutional:        Per HPI, otherwise negative  HENT:         Per HPI, otherwise negative  Respiratory:         Per HPI,  otherwise negative  Cardiovascular:        Per HPI, otherwise negative  Gastrointestinal:  Positive for nausea. Negative for vomiting.  Endocrine:       Negative aside from HPI  Genitourinary:        Neg aside from HPI   Musculoskeletal:        Per HPI, otherwise negative  Skin: Negative.   Neurological:  Negative for syncope.   Physical Exam Updated Vital Signs BP (!) 139/95   Pulse (!) 105   Temp (!) 97.4 F (36.3 C)   Resp 18   SpO2 99%   Physical Exam Vitals and nursing note reviewed.  Constitutional:      General: She is not in acute distress.    Appearance: She is well-developed.     Comments: Morbidly obese adult female awake, alert, sitting upright, using a cellular telephone without any difficulty, speaking clearly, without difficulty.  HENT:     Head: Normocephalic and atraumatic.  Eyes:     Conjunctiva/sclera: Conjunctivae normal.  Cardiovascular:     Rate and Rhythm: Normal rate and regular rhythm.  Pulmonary:     Effort: Pulmonary effort is normal. No respiratory distress.     Breath sounds: Normal breath sounds. No stridor.  Abdominal:     General: There is no distension.     Tenderness: There is no abdominal tenderness. There is no guarding.  Skin:    General: Skin is warm and dry.  Neurological:     Mental Status: She is alert and oriented to person, place, and time.     Cranial Nerves: No cranial nerve deficit.  Psychiatric:        Behavior: Behavior is not aggressive.        Cognition and Memory: Cognition is not impaired. Memory is not impaired.    ED Results / Procedures / Treatments   Labs (all labs ordered are listed, but only abnormal results are displayed) Labs Reviewed - No data to display  EKG None  Radiology No results found.  Procedures Procedures   Medications Ordered in ED Medications - No data to display  ED Course  I have reviewed the triage vital signs and the nursing notes.  Pertinent labs & imaging results that  were available during my care of the patient were reviewed by me and considered in my medical decision making (see chart for details).  Awake, alert, speaking clearly, no distress, no increased work of breathing.  Though she has multiple medical issues, as earlier today no evidence for acute new phenomena.  Labs from earlier in the day reviewed again, essentially baseline aside from mild leukocytosis, but no fever, no increased work of breathing, no cough.  Patient appropriate for discharge with outpatient follow-up.  Patient provided resources regarding today.  Final Clinical Impression(s) / ED Diagnoses Final diagnoses:  Homeless    Rx / DC Orders ED Discharge Orders     None        Carmin Muskrat, MD 02/04/21 2304

## 2021-02-04 NOTE — ED Notes (Signed)
Pt refused bus pass and taxi voucher, stating "I don't have anywhere to go". Pt given discharge instructions and wheeled to the lobby.

## 2021-02-04 NOTE — ED Triage Notes (Addendum)
Per EMS, patient from bus stop, says she cannot walk with SI today since she was out in the cold. Seen and discharged today.

## 2021-02-04 NOTE — Progress Notes (Signed)
No overnight issues, discharge summary completed 02/02/2021 labelled as progress note.   Remains medically stable. Vitals are stable.  [place] for discharge.   Gerlean Ren MD [facility]

## 2021-02-04 NOTE — ED Triage Notes (Signed)
Pt was discharged approximately an hour ago from [facility]. Pt states that she wants a second opinion here. Pt states that she has been having diarrhea and the "doctor didn't do nothing" at [facility]. Pt states that she has a PASSR form and needs to be placed.

## 2021-02-04 NOTE — Progress Notes (Signed)
Patient discharged from unit. Patient reports that she is homeless. Patient states her mother will be picking her up and/ or if not that she will arrange her transportation. I was present in room when CSW offered patient a taxi ride to a resource center that helps find shelters, patient refused. She requested that she be taken to the lobby and will find her own transportation. Patients arm band was removed and was escorted to the front lobby with her belongings and walker.

## 2021-02-04 NOTE — ED Provider Notes (Signed)
[facility] DEPT Provider Note   CSN: [id number] Arrival date & time: 02/04/21  1125     History Chief Complaint  Patient presents with  . Homeless  . Diarrhea    Heather [name] is a 45 y.o. female.  HPI Patient with morbid obesity, depression, COPD, diabetes presents less than 4 hours after leaving our affiliated facility, now with concern for homelessness.  She denies interval changes, including fever, falling, syncope.  She states that she is concerned that she has nowhere to go.  She states that she needs hospitalization for assistance with these efforts.  She denies new pain, notes that she has ongoing discomfort, difficulty with ambulation, and nausea.  She voices that she did not have anything done for her during her recent hospitalization.  Additional history is obtained on chart review, including discharge summary from this most recent hospitalization ending today, social work, physical therapy notes.  Patient has 9 prior ED visits in the past 6 months.  This most recent visit was for urinary tract infection, homelessness.  During that hospitalization she had antibiotics, and labs performed as recently as this morning were baseline, with minimal leukocytosis, creatinine consistently around 1.1.  Notes consistent with difficulty with placement secondary to homelessness, and the patient's lack of financing.  Though the patient perseverates on having a document that requires placement this is actually an assessment of what her needs would be should she be placed, this is a learned on conversation with our social work Medical laboratory scientific officer.    Pertinent excerpts from most recent hospitalization as below:  "PASRR evaluator came to hospital this afternoon and talked with patient. She followed up with CSW regarding patient's discharge plan and the possibility of SNF/ALF placement. She was advised that patient has no income for patient liability at a SNF or ALF.  "  Brief/Interim Summary: 45 y.o. female with medical history significant of COPD; anxiety/depression/schizoaffective d/o; DM; HLD; OSA; and morbid obesity presenting with urinary symptoms.  She was seen on 10/1 at [facility] for mental health issues and she had a +urine culture. She was admitted for UTI.  Urine cultures from 10/1 showed Enterococcus fecalis. Repeat urine cultures show multiple bacteria. She was started on IV vancomycin and transitioned to oral levaquin. We will go ahead d/c levaquin and transition her to fosfomycin. She reports she is homeless and does not have a place to go. Toc on board and assisting with placement. Therapy eval ordered recommended no PT follow up.  Patient is deemed medically stable to be discharged at this time without any acute inpatient medical needs.  She has all the recent prescriptions that were sent to the pharmacy with multiple refills during her recent hospitalization  Pt appears to be near recent baseline from previous hospitalizations. PT recommends no follow-up PT needs at the time of discharge.   Clinical Narrative:    CSW was notified that pt has been discharged. Pt has been here on multiple admissions, she is unable to be placed. TOC leadership has notified CSW that pt will need to discharge with resources, as PT has not specified a need for any continuing placement or therapy needs. Pt has previously been on the DTP caseload and has not been able to be placed.    Past Medical History:  Diagnosis Date  . Anxiety   . Arthritis   . Asthma   . Back pain   . Constipation   . COPD (chronic obstructive pulmonary disease) ([place])   . Depression   .  Diabetes mellitus without complication [facility])    patient denies but states she has hyperglycemia-diet controlled  . Dysrhythmia    DR Johnsie Cancel    . Ectopic pregnancy 2013  . Eosinophilic esophagitis    Diagnosed at [facility]/16/2013, untreated  . GERD (gastroesophageal reflux disease)    HEARTBURN   TUMS  .  High cholesterol   . IBS (irritable bowel syndrome)   . Leukocytosis 07/28/2008   Qualifier: Diagnosis of  By: Jonna Munro MD, Roderic Scarce    . Morbid obesity ([place])   . Neuromuscular disorder (HCC)    RESTLESS LEG   . Obesity   . Schizoaffective disorder, bipolar type ([place])   . Sleep apnea    CPAP- in process of restarting     Patient Active Problem List   Diagnosis Date Noted  . Cystitis 01/26/2021  . Social problem 01/26/2021  . Enterococcus faecalis infection 01/26/2021  . Mood disorder ([place]) 01/26/2021  . Chronic pain disorder 01/26/2021  . Thrombocytosis 10/31/2020  . CKD (chronic kidney disease), stage III ([place]) 10/31/2020  . Major depressive disorder, recurrent ([place]) 09/13/2020  . Insomnia   . Anemia of chronic disease   . Chronic bilateral low back pain without sciatica   . Debility 08/24/2020  . SIRS (systemic inflammatory response syndrome) ([place]) 08/18/2020  . Pressure injury of skin 08/11/2020  . Rhabdomyolysis 08/10/2020  . Acute renal failure ([place]) 08/10/2020  . PTSD (post-traumatic stress disorder) 05/15/2020  . Major depressive disorder, recurrent episode, moderate ([place]) 05/05/2020  . Generalized anxiety disorder 05/05/2020  . Blood in stool   . Gastritis and gastroduodenitis   . Benign neoplasm of sigmoid colon   . Class 3 severe obesity with serious comorbidity and body mass index (BMI) greater than or equal to 70 in adult ([place]) 06/16/2018  . Nexplanon insertion 03/27/2017  . Postpartum hypertension 03/05/2017  . Perennial allergic rhinitis 11/05/2016  . Mild persistent asthma with acute exacerbation 11/05/2016  . Hypoglycemia 11/07/2015  . OSA (obstructive sleep apnea) 11/07/2015  . DM type 2 (diabetes mellitus, type 2) ([place]) 12/07/2014  . Panniculitis 12/06/2014  . Cellulitis, abdominal wall 11/11/2014  . Abdominal pain 07/14/2014  . Eosinophilic esophagitis 04/16/3233  . Change in bowel habits 04/28/2013  . RLS (restless legs syndrome) 08/08/2011  .  Adjustment disorder 08/06/2011  . DYSMETABOLIC SYNDROME 57/32/2025  . Essential hypertension 05/12/2006  . Asthma 05/12/2006  . OSTEOARTHRITIS 05/12/2006    Past Surgical History:  Procedure Laterality Date  . BIOPSY  08/13/2018   Procedure: BIOPSY;  Surgeon: Yetta Flock, MD;  Location: [facility];  Service: Gastroenterology;;  . CESAREAN SECTION MULTI-GESTATIONAL N/A 02/03/2017   Procedure: CESAREAN SECTION MULTI-GESTATIONAL;  Surgeon: Jonnie Kind, MD;  Location: [place];  Service: Obstetrics;  Laterality: N/A;  . CHOLECYSTECTOMY    . COLONOSCOPY WITH PROPOFOL N/A 08/13/2018   Procedure: COLONOSCOPY WITH PROPOFOL;  Surgeon: Yetta Flock, MD;  Location: [facility];  Service: Gastroenterology;  Laterality: N/A;  . DENTAL SURGERY    . ESOPHAGOGASTRODUODENOSCOPY  May 2007   Dr. Gala Romney: Normal esophagus, stomach, D1, D2  . ESOPHAGOGASTRODUODENOSCOPY  06/16/2013   Dr. Carlton Adam, eosinophilic esophagitis, reactive gastropathy, no esophageal dilation  . ESOPHAGOGASTRODUODENOSCOPY (EGD) WITH PROPOFOL N/A 08/13/2018   Procedure: ESOPHAGOGASTRODUODENOSCOPY (EGD) WITH PROPOFOL;  Surgeon: Yetta Flock, MD;  Location: [facility];  Service: Gastroenterology;  Laterality: N/A;  . POLYPECTOMY  08/13/2018   Procedure: POLYPECTOMY;  Surgeon: Yetta Flock, MD;  Location: [facility];  Service: Gastroenterology;;  .  TONSILLECTOMY    . TOOTH EXTRACTION  10/28/2011   Procedure: DENTAL RESTORATION/EXTRACTIONS;  Surgeon: Gae Bon, DDS;  Location: [facility] OR;  Service: Oral Surgery;;  . UPPER GASTROINTESTINAL ENDOSCOPY       OB History     Gravida  2   Para  1   Term  1   Preterm      AB  1   Living  2      SAB  0   IAB      Ectopic  1   Multiple  1   Live Births  2           Family History  Problem Relation Age of Onset  . Depression Mother   . Anxiety disorder Mother   . High blood pressure Mother   . Bipolar disorder Mother   .  Eating disorder Mother   . Obesity Mother   . Hypertension Sister   . Allergic rhinitis Sister   . Colon polyps Maternal Grandmother        72s  . Diabetes Maternal Grandmother   . Anxiety disorder Maternal Grandmother   . COPD Maternal Grandmother   . Crohn's disease Maternal Aunt   . Cancer Maternal Grandfather        prostate  . HIV/AIDS Father   . Eating disorder Father   . Obesity Father   . Liver disease Neg Hx   . Angioedema Neg Hx   . Eczema Neg Hx   . Immunodeficiency Neg Hx   . Asthma Neg Hx   . Urticaria Neg Hx   . Colon cancer Neg Hx   . Esophageal cancer Neg Hx   . Rectal cancer Neg Hx   . Stomach cancer Neg Hx     Social History   Tobacco Use  . Smoking status: Former    Packs/day: 0.50    Years: 8.00    Pack years: 4.00    Types: Cigarettes    Quit date: 04/25/2011    Years since quitting: 9.7  . Smokeless tobacco: Never  Vaping Use  . Vaping Use: Never used  Substance Use Topics  . Alcohol use: Not Currently    Comment: occasional "relapse" of a wine cooler  . Drug use: Not Currently    Types: Marijuana    Home Medications Prior to Admission medications   Medication Sig Start Date End Date Taking? Authorizing Provider  albuterol (VENTOLIN HFA) 108 (90 Base) MCG/ACT inhaler Inhale 2 puffs into the lungs every 4 (four) hours as needed for wheezing or shortness of breath. 12/27/20   Samella Parr, NP  ascorbic acid (VITAMIN C) 500 MG tablet Take 1 tablet (500 mg total) by mouth daily. 12/27/20   Samella Parr, NP  buPROPion (WELLBUTRIN XL) 300 MG 24 hr tablet Take 1 tablet (300 mg total) by mouth daily. 01/29/21   Patrecia Pour, NP  clonazePAM (KLONOPIN) 0.5 MG tablet Take 1/2 tablet (0.25 mg total) by mouth daily as needed (panic attacks). 01/04/21   Samella Parr, NP  clotrimazole (LOTRIMIN) 1 % cream Apply topically 2 (two) times daily. 11/02/20   Shelly Coss, MD  diclofenac Sodium (VOLTAREN) 1 % GEL Apply 4 g topically 4 (four) times  daily. 12/27/20   Samella Parr, NP  fluticasone (FLONASE) 50 MCG/ACT nasal spray Place 2 sprays into both nostrils 2 (two) times daily. Patient taking differently: Place 2 sprays into both nostrils 2 (two) times daily as needed for allergies  or rhinitis. 12/27/20   Samella Parr, NP  furosemide (LASIX) 40 MG tablet Take 1 tablet (40 mg total) by mouth daily. 01/04/21   Samella Parr, NP  methocarbamol (ROBAXIN) 500 MG tablet Take 1 tablet (500 mg total) by mouth 4 (four) times daily. 12/27/20   Samella Parr, NP  mometasone-formoterol (DULERA) 100-5 MCG/ACT AERO Inhale 2 puffs into the lungs 2 (two) times daily. 12/27/20   Samella Parr, NP  nystatin (MYCOSTATIN/NYSTOP) powder Apply topically 3 (three) times daily. 12/27/20   Samella Parr, NP  oxyCODONE (OXY IR/ROXICODONE) 5 MG immediate release tablet Take 1 tablet (5 mg total) by mouth every 4 (four) hours as needed for moderate pain. 12/27/20   Samella Parr, NP  potassium chloride SA (KLOR-CON) 20 MEQ tablet Take 1 tablet (20 mEq total) by mouth daily. 01/04/21   Samella Parr, NP  pregabalin (LYRICA) 100 MG capsule Take 1 capsule (100 mg total) by mouth 3 (three) times daily. 12/27/20   Samella Parr, NP  rOPINIRole (REQUIP) 4 MG tablet Take 1 tablet (4 mg total) by mouth at bedtime. 12/27/20   Samella Parr, NP  senna-docusate (SENOKOT-S) 8.6-50 MG tablet Take 2 tablets by mouth daily. 12/27/20   Samella Parr, NP  silver sulfADIAZINE (SILVADENE) 1 % cream Apply topically daily. Patient not taking: Reported on 01/26/2021 01/04/21   Samella Parr, NP  topiramate (TOPAMAX) 25 MG tablet Take 1 tablet (25 mg total) by mouth 2 (two) times daily. 12/27/20   Samella Parr, NP  traZODone (DESYREL) 50 MG tablet Take 1 tablet (50 mg total) by mouth at bedtime. 01/29/21   Patrecia Pour, NP  vitamin B-12 (CYANOCOBALAMIN) 1000 MCG tablet Take 1 tablet (1,000 mcg total) by mouth daily. 12/27/20   Samella Parr, NP   ziprasidone (GEODON) 20 MG capsule Take 1 capsule (20 mg total) by mouth daily. 01/29/21   Patrecia Pour, NP    Allergies    Amoxicillin, Bee venom, Penicillin g, Penicillins, Adhesive [tape], Latex, and Vancomycin  Review of Systems   Review of Systems  Constitutional:        Per HPI, otherwise negative  HENT:         Per HPI, otherwise negative  Respiratory:         Per HPI, otherwise negative  Cardiovascular:        Per HPI, otherwise negative  Gastrointestinal:  Positive for nausea. Negative for vomiting.  Endocrine:       Negative aside from HPI  Genitourinary:        Neg aside from HPI   Musculoskeletal:        Per HPI, otherwise negative  Skin: Negative.   Neurological:  Negative for syncope.   Physical Exam Updated Vital Signs BP 135/81 (BP Location: Right Arm)   Pulse (!) 102   Temp 98 F (36.7 C) (Oral)   Resp 18   SpO2 94%   Physical Exam Vitals and nursing note reviewed.  Constitutional:      General: She is not in acute distress.    Appearance: She is well-developed.     Comments: Morbidly obese adult female awake, alert, sitting upright, using a cellular telephone without any difficulty, speaking clearly, without difficulty.  HENT:     Head: Normocephalic and atraumatic.  Eyes:     Conjunctiva/sclera: Conjunctivae normal.  Cardiovascular:     Rate and Rhythm: Normal rate and regular rhythm.  Pulmonary:  Effort: Pulmonary effort is normal. No respiratory distress.     Breath sounds: Normal breath sounds. No stridor.  Abdominal:     General: There is no distension.     Tenderness: There is no abdominal tenderness. There is no guarding.  Skin:    General: Skin is warm and dry.  Neurological:     Mental Status: She is alert and oriented to person, place, and time.     Cranial Nerves: No cranial nerve deficit.  Psychiatric:        Behavior: Behavior is not aggressive.        Cognition and Memory: Cognition is not impaired. Memory is not  impaired.    ED Results / Procedures / Treatments   Labs from earlier today reviewed, hospitalization notes from this past week reviewed.  Labs reassuring.  Procedures Procedures   Medications Ordered in ED Medications - No data to display  ED Course  I have reviewed the triage vital signs and the nursing notes.  Pertinent labs & imaging results that were available during my care of the patient were reviewed by me and considered in my medical decision making (see chart for details).  Initial evaluation I discussed the patient's presentation with her mother via telephone.  The patient's mother states that she is unable to offer any assistance to the daughter.  Mother states that she has no financial resources, is her homeless herself, and that the patient cannot stay with her.  I discussed the patient's case with her social work Medical laboratory scientific officer, they note that the patient's situation is difficult, but that she is not in need of admission for social work placement, and prior efforts to place her have been unsuccessful and/or been dismissed by the patient. With patient denying any change in her condition since discharge earlier today, notation of substantial baseline chronic medical issues, but no acute findings here, no hemodynamic instability, no hypoxia, no suggestion of new pneumonia or other acute phenomena, patient appropriate for discharge.  Offer for taxi voucher, additional resources for shelters provided.  Final Clinical Impression(s) / ED Diagnoses Final diagnoses:  Homeless    Rx / DC Orders ED Discharge Orders     None        Carmin Muskrat, MD 02/04/21 1427

## 2021-02-04 NOTE — TOC Transition Note (Addendum)
Transition of Care [facility]) - CM/SW Discharge Note   Patient Details  Name: Kirsta Probert Husted MRN: [id number] Date of Birth: Oct 28, 1975  [facility] CM/SW Contact:  Coralee Pesa, [place] Phone Number: 02/04/2021, 10:05 AM   Clinical Narrative:     CSW met with pt and RN at bedside to discuss DC today. CSW offered a taxi voucher, this was declined. Questions were answered, pt planning on leaving by 10. TOC to sign off.  CSW was contacted by volunteer services who stated that pt was now down in the lobby. They spoke with her and offered the same things, she raised her voice with them. She was advised she would have an hour before she would be asked to leave. Pt asking for number to mobile crisis, this was provided. Final next level of care: Long Term Nursing Home Barriers to Discharge: Financial Resources, Other (must enter comment) (No shelter beds available)   Patient Goals and CMS Choice Patient states their goals for this hospitalization and ongoing recovery are:: Patient has no income and no where to live CMS Medicare.gov Compare Post Acute Care list provided to::  (Will be provided to patient based on bed offers received)    Discharge Placement                       Discharge Plan and Services In-house Referral: Clinical Social Work   Post Acute Care Choice: Nursing Home (Patient needs LTC bed)                               Social Determinants of Health (SDOH) Interventions     Readmission Risk Interventions Readmission Risk Prevention Plan 12/08/2020  Transportation Screening Complete  Medication Review Press photographer) Complete  PCP or Specialist appointment within 3-5 days of discharge Complete  HRI or [place] Complete  SW Recovery Care/Counseling Consult Complete  [place] Not Applicable  Some recent data might be hidden

## 2021-02-04 NOTE — Discharge Instructions (Addendum)
Please use the provided taxi voucher and previously obtained resources to find an appropriate living situation.  If you develop new, or concerning changes, do not hesitate to return here.

## 2021-02-05 ENCOUNTER — Emergency Department (EMERGENCY_DEPARTMENT_HOSPITAL)
Admission: EM | Admit: 2021-02-05 | Discharge: 2021-02-07 | Disposition: A | Discharge status: Home / Self Care | Payer: 59 | Source: Home / Self Care | Attending: Emergency Medicine | Admitting: Emergency Medicine

## 2021-02-05 ENCOUNTER — Encounter (HOSPITAL_COMMUNITY): Payer: Self-pay

## 2021-02-05 ENCOUNTER — Emergency Department (HOSPITAL_COMMUNITY)
Admission: EM | Admit: 2021-02-05 | Discharge: 2021-02-05 | Disposition: A | Discharge status: Home / Self Care | Payer: 59 | Attending: Emergency Medicine | Admitting: Emergency Medicine

## 2021-02-05 ENCOUNTER — Other Ambulatory Visit: Payer: Self-pay

## 2021-02-05 DIAGNOSIS — Z5900 Homelessness unspecified: Secondary | ICD-10-CM

## 2021-02-05 DIAGNOSIS — G8929 Other chronic pain: Secondary | ICD-10-CM | POA: Diagnosis not present

## 2021-02-05 DIAGNOSIS — J45909 Unspecified asthma, uncomplicated: Secondary | ICD-10-CM | POA: Insufficient documentation

## 2021-02-05 DIAGNOSIS — Z79899 Other long term (current) drug therapy: Secondary | ICD-10-CM | POA: Insufficient documentation

## 2021-02-05 DIAGNOSIS — E1122 Type 2 diabetes mellitus with diabetic chronic kidney disease: Secondary | ICD-10-CM | POA: Insufficient documentation

## 2021-02-05 DIAGNOSIS — Z6841 Body Mass Index (BMI) 40.0 and over, adult: Secondary | ICD-10-CM | POA: Diagnosis not present

## 2021-02-05 DIAGNOSIS — N183 Chronic kidney disease, stage 3 unspecified: Secondary | ICD-10-CM | POA: Insufficient documentation

## 2021-02-05 DIAGNOSIS — R5383 Other fatigue: Secondary | ICD-10-CM | POA: Insufficient documentation

## 2021-02-05 DIAGNOSIS — Z20822 Contact with and (suspected) exposure to covid-19: Secondary | ICD-10-CM | POA: Insufficient documentation

## 2021-02-05 DIAGNOSIS — F250 Schizoaffective disorder, bipolar type: Secondary | ICD-10-CM | POA: Insufficient documentation

## 2021-02-05 DIAGNOSIS — Z87891 Personal history of nicotine dependence: Secondary | ICD-10-CM | POA: Insufficient documentation

## 2021-02-05 DIAGNOSIS — Z5948 Other specified lack of adequate food: Secondary | ICD-10-CM | POA: Diagnosis not present

## 2021-02-05 DIAGNOSIS — R197 Diarrhea, unspecified: Secondary | ICD-10-CM | POA: Insufficient documentation

## 2021-02-05 DIAGNOSIS — Z765 Malingerer [conscious simulation]: Secondary | ICD-10-CM | POA: Insufficient documentation

## 2021-02-05 DIAGNOSIS — Z596 Low income: Secondary | ICD-10-CM | POA: Insufficient documentation

## 2021-02-05 DIAGNOSIS — I129 Hypertensive chronic kidney disease with stage 1 through stage 4 chronic kidney disease, or unspecified chronic kidney disease: Secondary | ICD-10-CM | POA: Insufficient documentation

## 2021-02-05 DIAGNOSIS — R000 Tachycardia, unspecified: Secondary | ICD-10-CM | POA: Diagnosis not present

## 2021-02-05 DIAGNOSIS — R45851 Suicidal ideations: Secondary | ICD-10-CM | POA: Insufficient documentation

## 2021-02-05 DIAGNOSIS — M549 Dorsalgia, unspecified: Secondary | ICD-10-CM | POA: Diagnosis present

## 2021-02-05 DIAGNOSIS — Z881 Allergy status to other antibiotic agents status: Secondary | ICD-10-CM | POA: Insufficient documentation

## 2021-02-05 DIAGNOSIS — Z880 Allergy status to penicillin: Secondary | ICD-10-CM | POA: Insufficient documentation

## 2021-02-05 DIAGNOSIS — M545 Low back pain, unspecified: Secondary | ICD-10-CM | POA: Diagnosis not present

## 2021-02-05 DIAGNOSIS — J4530 Mild persistent asthma, uncomplicated: Secondary | ICD-10-CM | POA: Diagnosis not present

## 2021-02-05 DIAGNOSIS — Z91040 Latex allergy status: Secondary | ICD-10-CM | POA: Insufficient documentation

## 2021-02-05 DIAGNOSIS — Z560 Unemployment, unspecified: Secondary | ICD-10-CM | POA: Insufficient documentation

## 2021-02-05 DIAGNOSIS — J449 Chronic obstructive pulmonary disease, unspecified: Secondary | ICD-10-CM | POA: Insufficient documentation

## 2021-02-05 DIAGNOSIS — Z7951 Long term (current) use of inhaled steroids: Secondary | ICD-10-CM | POA: Diagnosis not present

## 2021-02-05 DIAGNOSIS — Z91030 Bee allergy status: Secondary | ICD-10-CM | POA: Diagnosis not present

## 2021-02-05 DIAGNOSIS — R0789 Other chest pain: Secondary | ICD-10-CM | POA: Diagnosis not present

## 2021-02-05 DIAGNOSIS — Z888 Allergy status to other drugs, medicaments and biological substances status: Secondary | ICD-10-CM | POA: Diagnosis not present

## 2021-02-05 LAB — CBC WITH DIFFERENTIAL/PLATELET
Abs Immature Granulocytes: 0.19 10*3/uL — ABNORMAL HIGH (ref 0.00–0.07)
Basophils Absolute: 0.1 10*3/uL (ref 0.0–0.1)
Basophils Relative: 0 %
Eosinophils Absolute: 0.3 10*3/uL (ref 0.0–0.5)
Eosinophils Relative: 1 %
HCT: 41.4 % (ref 36.0–46.0)
Hemoglobin: 12.3 g/dL (ref 12.0–15.0)
Immature Granulocytes: 1 %
Lymphocytes Relative: 11 %
Lymphs Abs: 2.1 10*3/uL (ref 0.7–4.0)
MCH: 25.5 pg — ABNORMAL LOW (ref 26.0–34.0)
MCHC: 29.7 g/dL — ABNORMAL LOW (ref 30.0–36.0)
MCV: 85.9 fL (ref 80.0–100.0)
Monocytes Absolute: 0.7 10*3/uL (ref 0.1–1.0)
Monocytes Relative: 4 %
Neutro Abs: 15.9 10*3/uL — ABNORMAL HIGH (ref 1.7–7.7)
Neutrophils Relative %: 83 %
Platelets: 488 10*3/uL — ABNORMAL HIGH (ref 150–400)
RBC: 4.82 MIL/uL (ref 3.87–5.11)
RDW: 18.3 % — ABNORMAL HIGH (ref 11.5–15.5)
WBC: 19.2 10*3/uL — ABNORMAL HIGH (ref 4.0–10.5)
nRBC: 0 % (ref 0.0–0.2)

## 2021-02-05 LAB — COMPREHENSIVE METABOLIC PANEL
ALT: 19 U/L (ref 0–44)
AST: 16 U/L (ref 15–41)
Albumin: 3.9 g/dL (ref 3.5–5.0)
Alkaline Phosphatase: 76 U/L (ref 38–126)
Anion gap: 10 (ref 5–15)
BUN: 37 mg/dL — ABNORMAL HIGH (ref 6–20)
CO2: 23 mmol/L (ref 22–32)
Calcium: 9.9 mg/dL (ref 8.9–10.3)
Chloride: 104 mmol/L (ref 98–111)
Creatinine, Ser: 1.32 mg/dL — ABNORMAL HIGH (ref 0.44–1.00)
GFR, Estimated: 51 mL/min — ABNORMAL LOW (ref >60)
Glucose, Bld: 102 mg/dL — ABNORMAL HIGH (ref 70–99)
Potassium: 3.7 mmol/L (ref 3.5–5.1)
Sodium: 137 mmol/L (ref 135–145)
Total Bilirubin: 0.3 mg/dL (ref 0.3–1.2)
Total Protein: 8.9 g/dL — ABNORMAL HIGH (ref 6.5–8.1)

## 2021-02-05 LAB — MAGNESIUM: Magnesium: 2.5 mg/dL — ABNORMAL HIGH (ref 1.7–2.4)

## 2021-02-05 MED ORDER — PREGABALIN 50 MG PO CAPS
100.0000 mg | ORAL_CAPSULE | Freq: Three times a day (TID) | ORAL | Status: DC
  Administered 2021-02-06 – 2021-02-07 (×4): 100 mg via ORAL
  Filled 2021-02-05: qty 4
  Filled 2021-02-05 (×3): qty 2

## 2021-02-05 MED ORDER — TRAZODONE HCL 100 MG PO TABS
50.0000 mg | ORAL_TABLET | Freq: Every day | ORAL | Status: DC
  Administered 2021-02-06: 50 mg via ORAL
  Filled 2021-02-05: qty 1

## 2021-02-05 MED ORDER — TOPIRAMATE 25 MG PO TABS
25.0000 mg | ORAL_TABLET | Freq: Two times a day (BID) | ORAL | Status: DC
  Administered 2021-02-06 – 2021-02-07 (×3): 25 mg via ORAL
  Filled 2021-02-05 (×3): qty 1

## 2021-02-05 MED ORDER — BUPROPION HCL ER (XL) 150 MG PO TB24
300.0000 mg | ORAL_TABLET | Freq: Every day | ORAL | Status: DC
  Administered 2021-02-06 – 2021-02-07 (×2): 300 mg via ORAL
  Filled 2021-02-05 (×2): qty 2

## 2021-02-05 MED ORDER — MOMETASONE FURO-FORMOTEROL FUM 100-5 MCG/ACT IN AERO
2.0000 | INHALATION_SPRAY | Freq: Two times a day (BID) | RESPIRATORY_TRACT | Status: DC
  Administered 2021-02-06: 2 via RESPIRATORY_TRACT
  Filled 2021-02-05: qty 8.8

## 2021-02-05 MED ORDER — FLUTICASONE PROPIONATE 50 MCG/ACT NA SUSP: 2.0000 | Freq: Two times a day (BID) | NASAL | Status: DC | PRN

## 2021-02-05 MED ORDER — ALBUTEROL SULFATE HFA 108 (90 BASE) MCG/ACT IN AERS: 2.0000 | INHALATION_SPRAY | RESPIRATORY_TRACT | Status: DC | PRN

## 2021-02-05 MED ORDER — ROPINIROLE HCL 1 MG PO TABS
4.0000 mg | ORAL_TABLET | Freq: Every day | ORAL | Status: DC
  Administered 2021-02-06: 4 mg via ORAL
  Filled 2021-02-05 (×3): qty 4

## 2021-02-05 MED ORDER — CLONAZEPAM 0.125 MG PO TBDP: 0.2500 mg | ORAL_TABLET | Freq: Every day | ORAL | Status: DC | PRN

## 2021-02-05 MED ORDER — ZIPRASIDONE HCL 20 MG PO CAPS
20.0000 mg | ORAL_CAPSULE | Freq: Every day | ORAL | Status: DC
  Administered 2021-02-06: 20 mg via ORAL
  Filled 2021-02-05: qty 1

## 2021-02-05 MED ORDER — ACETAMINOPHEN 325 MG PO TABS
650.0000 mg | ORAL_TABLET | Freq: Once | ORAL | Status: AC
  Administered 2021-02-05: 650 mg via ORAL
  Filled 2021-02-05: qty 2

## 2021-02-05 MED ORDER — FUROSEMIDE 40 MG PO TABS
40.0000 mg | ORAL_TABLET | Freq: Every day | ORAL | Status: DC
  Administered 2021-02-06 – 2021-02-07 (×2): 40 mg via ORAL
  Filled 2021-02-05 (×2): qty 1

## 2021-02-05 MED ORDER — SENNOSIDES-DOCUSATE SODIUM 8.6-50 MG PO TABS: 2.0000 | ORAL_TABLET | Freq: Every evening | ORAL | Status: DC | PRN

## 2021-02-05 MED ORDER — METHOCARBAMOL 500 MG PO TABS
500.0000 mg | ORAL_TABLET | Freq: Four times a day (QID) | ORAL | Status: DC
  Administered 2021-02-06 – 2021-02-07 (×5): 500 mg via ORAL
  Filled 2021-02-05 (×5): qty 1

## 2021-02-05 NOTE — ED Notes (Signed)
Pt has change out of wet clothes and into a gown. Pt has three pt's bag in cabinet 23-25 also a walker at bedside with her backpack on it.

## 2021-02-05 NOTE — Discharge Instructions (Signed)
Please follow-up with the [facility].  You were given this information already today.

## 2021-02-05 NOTE — ED Provider Notes (Signed)
[facility] DEPT Provider Note   CSN: [id number] Arrival date & time: 02/05/21  3244     History Chief Complaint  Patient presents with   Homeless    Heather [name] is a 45 y.o. female.  HPI Patient is a 45 year old female presented to the ER today with complaints of homelessness, back pain, generalized fatigue and anxiety  She was seen yesterday x2  She states she has no new symptoms since yesterday.  She states that her symptoms have been consistent.  She is also complaining of some loose poop.  She states that she is having several episodes of diarrhea per day denies any blood in her stool.  No fevers lightheadedness or dizziness no chest pain or shortness of breath.  Patient seems to have a history of low back pain.  She denies any new weakness or numbness in her extremities, no hx of IVDU.      Past Medical History:  Diagnosis Date   Anxiety    Arthritis    Asthma    Back pain    Constipation    COPD (chronic obstructive pulmonary disease) ([place])    Depression    Diabetes mellitus without complication ([place])    patient denies but states she has hyperglycemia-diet controlled   Dysrhythmia    DR Johnsie Cancel     Ectopic pregnancy 0102   Eosinophilic esophagitis    Diagnosed at [facility]/16/2013, untreated   GERD (gastroesophageal reflux disease)    HEARTBURN   TUMS   High cholesterol    IBS (irritable bowel syndrome)    Leukocytosis 07/28/2008   Qualifier: Diagnosis of  By: Jonna Munro MD, Cornelius     Morbid obesity [facility])    Neuromuscular disorder ([place])    RESTLESS LEG    Obesity    Schizoaffective disorder, bipolar type ([place])    Sleep apnea    CPAP- in process of restarting     Patient Active Problem List   Diagnosis Date Noted   Cystitis 01/26/2021   Social problem 01/26/2021   Enterococcus faecalis infection 01/26/2021   Mood disorder ([place]) 01/26/2021   Chronic pain disorder 01/26/2021   Thrombocytosis 10/31/2020    CKD (chronic kidney disease), stage III ([place]) 10/31/2020   Major depressive disorder, recurrent ([place]) 09/13/2020   Insomnia    Anemia of chronic disease    Chronic bilateral low back pain without sciatica    Debility 08/24/2020   SIRS (systemic inflammatory response syndrome) ([place]) 08/18/2020   Pressure injury of skin 08/11/2020   Rhabdomyolysis 08/10/2020   Acute renal failure ([place]) 08/10/2020   PTSD (post-traumatic stress disorder) 05/15/2020   Major depressive disorder, recurrent episode, moderate ([place]) 05/05/2020   Generalized anxiety disorder 05/05/2020   Blood in stool    Gastritis and gastroduodenitis    Benign neoplasm of sigmoid colon    Class 3 severe obesity with serious comorbidity and body mass index (BMI) greater than or equal to 70 in adult ([place]) 06/16/2018   Nexplanon insertion 03/27/2017   Postpartum hypertension 03/05/2017   Perennial allergic rhinitis 11/05/2016   Mild persistent asthma with acute exacerbation 11/05/2016   Hypoglycemia 11/07/2015   OSA (obstructive sleep apnea) 11/07/2015   DM type 2 (diabetes mellitus, type 2) ([place]) 12/07/2014   Panniculitis 12/06/2014   Cellulitis, abdominal wall 11/11/2014   Abdominal pain 72/53/6644   Eosinophilic esophagitis 03/47/4259   Change in bowel habits 04/28/2013   RLS (restless legs syndrome) 08/08/2011   Adjustment disorder 56/38/7564   DYSMETABOLIC  SYNDROME 05/13/2006   Essential hypertension 05/12/2006   Asthma 05/12/2006   OSTEOARTHRITIS 05/12/2006    Past Surgical History:  Procedure Laterality Date   BIOPSY  08/13/2018   Procedure: BIOPSY;  Surgeon: Yetta Flock, MD;  Location: [facility];  Service: Gastroenterology;;   CESAREAN SECTION MULTI-GESTATIONAL N/A 02/03/2017   Procedure: CESAREAN SECTION MULTI-GESTATIONAL;  Surgeon: Jonnie Kind, MD;  Location: [facility];  Service: Obstetrics;  Laterality: N/A;   CHOLECYSTECTOMY     COLONOSCOPY WITH PROPOFOL N/A 08/13/2018   Procedure:  COLONOSCOPY WITH PROPOFOL;  Surgeon: Yetta Flock, MD;  Location: [facility];  Service: Gastroenterology;  Laterality: N/A;   DENTAL SURGERY     ESOPHAGOGASTRODUODENOSCOPY  May 2007   Dr. Gala Romney: Normal esophagus, stomach, D1, D2   ESOPHAGOGASTRODUODENOSCOPY  06/16/2013   Dr. [place] Adam, eosinophilic esophagitis, reactive gastropathy, no esophageal dilation   ESOPHAGOGASTRODUODENOSCOPY (EGD) WITH PROPOFOL N/A 08/13/2018   Procedure: ESOPHAGOGASTRODUODENOSCOPY (EGD) WITH PROPOFOL;  Surgeon: Yetta Flock, MD;  Location: [facility];  Service: Gastroenterology;  Laterality: N/A;   POLYPECTOMY  08/13/2018   Procedure: POLYPECTOMY;  Surgeon: Yetta Flock, MD;  Location: [facility];  Service: Gastroenterology;;   TONSILLECTOMY     TOOTH EXTRACTION  10/28/2011   Procedure: DENTAL RESTORATION/EXTRACTIONS;  Surgeon: Gae Bon, DDS;  Location: [facility] OR;  Service: Oral Surgery;;   UPPER GASTROINTESTINAL ENDOSCOPY       OB History     Gravida  2   Para  1   Term  1   Preterm      AB  1   Living  2      SAB  0   IAB      Ectopic  1   Multiple  1   Live Births  2           Family History  Problem Relation Age of Onset   Depression Mother    Anxiety disorder Mother    High blood pressure Mother    Bipolar disorder Mother    Eating disorder Mother    Obesity Mother    Hypertension Sister    Allergic rhinitis Sister    Colon polyps Maternal Grandmother        66s   Diabetes Maternal Grandmother    Anxiety disorder Maternal Grandmother    COPD Maternal Grandmother    Crohn's disease Maternal Aunt    Cancer Maternal Grandfather        prostate   HIV/AIDS Father    Eating disorder Father    Obesity Father    Liver disease Neg Hx    Angioedema Neg Hx    Eczema Neg Hx    Immunodeficiency Neg Hx    Asthma Neg Hx    Urticaria Neg Hx    Colon cancer Neg Hx    Esophageal cancer Neg Hx    Rectal cancer Neg Hx    Stomach cancer Neg Hx      Social History   Tobacco Use   Smoking status: Former    Packs/day: 0.50    Years: 8.00    Pack years: 4.00    Types: Cigarettes    Quit date: 04/25/2011    Years since quitting: 9.7   Smokeless tobacco: Never  Vaping Use   Vaping Use: Never used  Substance Use Topics   Alcohol use: Not Currently    Comment: occasional "relapse" of a wine cooler   Drug use: Not Currently    Types: Marijuana  Home Medications Prior to Admission medications   Medication Sig Start Date End Date Taking? Authorizing Provider  albuterol (VENTOLIN HFA) 108 (90 Base) MCG/ACT inhaler Inhale 2 puffs into the lungs every 4 (four) hours as needed for wheezing or shortness of breath. 12/27/20   Samella Parr, NP  ascorbic acid (VITAMIN C) 500 MG tablet Take 1 tablet (500 mg total) by mouth daily. 12/27/20   Samella Parr, NP  buPROPion (WELLBUTRIN XL) 300 MG 24 hr tablet Take 1 tablet (300 mg total) by mouth daily. 01/29/21   Patrecia Pour, NP  clonazePAM (KLONOPIN) 0.5 MG tablet Take 1/2 tablet (0.25 mg total) by mouth daily as needed (panic attacks). 01/04/21   Samella Parr, NP  clotrimazole (LOTRIMIN) 1 % cream Apply topically 2 (two) times daily. 11/02/20   Shelly Coss, MD  diclofenac Sodium (VOLTAREN) 1 % GEL Apply 4 g topically 4 (four) times daily. 12/27/20   Samella Parr, NP  fluticasone (FLONASE) 50 MCG/ACT nasal spray Place 2 sprays into both nostrils 2 (two) times daily. Patient taking differently: Place 2 sprays into both nostrils 2 (two) times daily as needed for allergies or rhinitis. 12/27/20   Samella Parr, NP  furosemide (LASIX) 40 MG tablet Take 1 tablet (40 mg total) by mouth daily. 01/04/21   Samella Parr, NP  methocarbamol (ROBAXIN) 500 MG tablet Take 1 tablet (500 mg total) by mouth 4 (four) times daily. 12/27/20   Samella Parr, NP  mometasone-formoterol (DULERA) 100-5 MCG/ACT AERO Inhale 2 puffs into the lungs 2 (two) times daily. 12/27/20   Samella Parr,  NP  nystatin (MYCOSTATIN/NYSTOP) powder Apply topically 3 (three) times daily. 12/27/20   Samella Parr, NP  oxyCODONE (OXY IR/ROXICODONE) 5 MG immediate release tablet Take 1 tablet (5 mg total) by mouth every 4 (four) hours as needed for moderate pain. 12/27/20   Samella Parr, NP  potassium chloride SA (KLOR-CON) 20 MEQ tablet Take 1 tablet (20 mEq total) by mouth daily. 01/04/21   Samella Parr, NP  pregabalin (LYRICA) 100 MG capsule Take 1 capsule (100 mg total) by mouth 3 (three) times daily. 12/27/20   Samella Parr, NP  rOPINIRole (REQUIP) 4 MG tablet Take 1 tablet (4 mg total) by mouth at bedtime. 12/27/20   Samella Parr, NP  senna-docusate (SENOKOT-S) 8.6-50 MG tablet Take 2 tablets by mouth daily. 12/27/20   Samella Parr, NP  silver sulfADIAZINE (SILVADENE) 1 % cream Apply topically daily. Patient not taking: Reported on 01/26/2021 01/04/21   Samella Parr, NP  topiramate (TOPAMAX) 25 MG tablet Take 1 tablet (25 mg total) by mouth 2 (two) times daily. 12/27/20   Samella Parr, NP  traZODone (DESYREL) 50 MG tablet Take 1 tablet (50 mg total) by mouth at bedtime. 01/29/21   Patrecia Pour, NP  vitamin B-12 (CYANOCOBALAMIN) 1000 MCG tablet Take 1 tablet (1,000 mcg total) by mouth daily. 12/27/20   Samella Parr, NP  ziprasidone (GEODON) 20 MG capsule Take 1 capsule (20 mg total) by mouth daily. 01/29/21   Patrecia Pour, NP    Allergies    Amoxicillin, Bee venom, Penicillin g, Penicillins, Adhesive [tape], Latex, and Vancomycin  Review of Systems   Review of Systems  Constitutional:  Positive for fatigue. Negative for fever.  HENT:  Negative for congestion.   Respiratory:  Negative for shortness of breath.   Cardiovascular:  Negative for chest pain.  Gastrointestinal:  Positive  for diarrhea. Negative for abdominal distention.  Musculoskeletal:  Positive for back pain.  Skin:  Negative for rash.  Neurological:  Negative for dizziness and headaches.   Physical  Exam Updated Vital Signs BP (!) 136/101   Pulse (!) 105   Temp 98.1 F (36.7 C) (Oral)   Resp (!) 22   SpO2 97%   Physical Exam Vitals and nursing note reviewed.  Constitutional:      General: She is not in acute distress.    Comments: Chronically ill-appearing 45 year old female morbidly obese in no acute distress nontoxic-appearing Able answer questions appropriately follow commands.  HENT:     Head: Normocephalic and atraumatic.     Nose: Nose normal.  Eyes:     General: No scleral icterus. Cardiovascular:     Rate and Rhythm: Normal rate and regular rhythm.     Pulses: Normal pulses.     Heart sounds: Normal heart sounds.  Pulmonary:     Effort: Pulmonary effort is normal. No respiratory distress.     Breath sounds: No wheezing.  Abdominal:     Palpations: Abdomen is soft.     Tenderness: There is no abdominal tenderness.  Musculoskeletal:     Cervical back: Normal range of motion.     Right lower leg: No edema.     Left lower leg: No edema.     Comments: No midline tenderness to palpation of C, T, L-spine  Skin:    General: Skin is warm and dry.     Capillary Refill: Capillary refill takes less than 2 seconds.  Neurological:     Mental Status: She is alert. Mental status is at baseline.  Psychiatric:        Mood and Affect: Mood normal.        Behavior: Behavior normal.    ED Results / Procedures / Treatments   Labs (all labs ordered are listed, but only abnormal results are displayed) Labs Reviewed - No data to display  EKG None  Radiology No results found.  Procedures Procedures   Medications Ordered in ED Medications  acetaminophen (TYLENOL) tablet 650 mg (650 mg Oral Given 02/05/21 0908)    ED Course  I have reviewed the triage vital signs and the nursing notes.  Pertinent labs & imaging results that were available during my care of the patient were reviewed by me and considered in my medical decision making (see chart for details).     MDM Rules/Calculators/A&P                          Patient is a 45 year old chronically ill morbidly obese female  She is presenting today with no new symptoms from when she was seen over the past few days.  I reviewed labs from 1 day, 2 days, 3 days, 4 days, 5 days, 7 days, 9 days ago  Patient has had no change in kidney function anemia is unchanged from prior.  Very mild leukocytosis consistent with what she had 3 days ago she is afebrile she is mildly tachycardic with a heart rate of 105 on my examination.  I reviewed EMR it appears that she is often somewhat tachycardic.  Mag within normal limits.  Given the patient has been seen multiple times yesterday she seems of been medically cleared yesterday.  She had no acute changes is requesting a bus pass at this time although when provided this she has Therapist, sports for [employer] instead.  She has sensation  in bilateral lower extremities is able to move both legs and has bilateral ankle reflexes  Low suspicion for cauda equina or other acute emergent cause of her back pain.  She will need follow-up with her PCP and return to the ER should she experience any new emergent symptoms.  Final Clinical Impression(s) / ED Diagnoses Final diagnoses:  Homeless  Chronic low back pain without sciatica, unspecified back pain laterality    Rx / DC Orders ED Discharge Orders     None        Tedd Sias, [place] 02/05/21 1056    Blanchie Dessert, MD 02/05/21 1410

## 2021-02-05 NOTE — ED Notes (Signed)
Pt assisted to the restroom by NT to clean up as pt had soiled herself and was in wet clothing. Pt ambulated from stretcher with bathroom with a walker and 1 assist.

## 2021-02-05 NOTE — Progress Notes (Addendum)
CSW was asked to speak with pt, due to pt sitting in the waiting area overnight to speak with a Education officer, museum. Pt has numerous ED visits throughout the [facility] system, TOC has provided pt with all resources available. Pt was informed several times ,she cannot get placed through the hospital. Pt was informed there is nothing else TOC can do for her. CSW provided pt with a bus pass and informed pt of the [facility] day program. The [facility] can help pt find shelter if needed. TOC sign off.  Post/ DC 9:30am Pt is refusing to take bus pass. Pt stated she does not know how to take bus. Pt has been d/c from [facility] several times with bus pass. CSW will contact transportation for ride to [facility].   Arlie Solomons.Karas Pickerill, MSW, [place]  Transitions of Care Clinical Social Worker I Direct Dial: [phone]  Fax: [phone] [email or web address]

## 2021-02-05 NOTE — BH Assessment (Signed)
Clinician messaged [place] Jansky. Campos-Garcia, RN: "Hey [employer] with TTS. Is the pt about to be assessed, if so she will need to be placed in a private room. Also is the pt under IVC?"   Clinician awaiting response.    Vertell Novak, [place], [facility], [facility] Triage Specialist [phone]

## 2021-02-05 NOTE — ED Provider Notes (Signed)
[facility] DEPT Provider Note   CSN: [id number] Arrival date & time: 02/05/21  1847     History Chief Complaint  Patient presents with   Suicidal   Homeless    Heather [name] is a 45 y.o. female.  Pt presents to the ED today with suicidal ideas and a plan.  She wants to cut herself with glass.  Pt also c/o diarrhea and homelessness.  Pt has been here frequently recently.  She was admitted for pyelo from 10/20-10/30.  She is homeless and was unable to be placed.  She came back to the hospital twice after discharge on the 30th.  The pt was also here this morning.  SW saw her and said there is nothing they can do.  The patient is back again today and now she is saying she is suicidal.  She is also still complaining of diarrhea.      Past Medical History:  Diagnosis Date   Anxiety    Arthritis    Asthma    Back pain    Constipation    COPD (chronic obstructive pulmonary disease) ([place])    Depression    Diabetes mellitus without complication ([place])    patient denies but states she has hyperglycemia-diet controlled   Dysrhythmia    DR Johnsie Cancel     Ectopic pregnancy 5102   Eosinophilic esophagitis    Diagnosed at [facility] 06/16/2013, untreated   GERD (gastroesophageal reflux disease)    HEARTBURN   TUMS   High cholesterol    IBS (irritable bowel syndrome)    Leukocytosis 07/28/2008   Qualifier: Diagnosis of  By: Jonna Munro MD, Cornelius     Morbid obesity [facility])    Neuromuscular disorder ([place])    RESTLESS LEG    Obesity    Schizoaffective disorder, bipolar type ([place])    Sleep apnea    CPAP- in process of restarting     Patient Active Problem List   Diagnosis Date Noted   Cystitis 01/26/2021   Social problem 01/26/2021   Enterococcus faecalis infection 01/26/2021   Mood disorder ([place]) 01/26/2021   Chronic pain disorder 01/26/2021   Thrombocytosis 10/31/2020   CKD (chronic kidney disease), stage III ([place]) 10/31/2020   Major depressive  disorder, recurrent ([place]) 09/13/2020   Insomnia    Anemia of chronic disease    Chronic bilateral low back pain without sciatica    Debility 08/24/2020   SIRS (systemic inflammatory response syndrome) ([place]) 08/18/2020   Pressure injury of skin 08/11/2020   Rhabdomyolysis 08/10/2020   Acute renal failure ([place]) 08/10/2020   PTSD (post-traumatic stress disorder) 05/15/2020   Major depressive disorder, recurrent episode, moderate ([place]) 05/05/2020   Generalized anxiety disorder 05/05/2020   Blood in stool    Gastritis and gastroduodenitis    Benign neoplasm of sigmoid colon    Class 3 severe obesity with serious comorbidity and body mass index (BMI) greater than or equal to 70 in adult ([place]) 06/16/2018   Nexplanon insertion 03/27/2017   Postpartum hypertension 03/05/2017   Perennial allergic rhinitis 11/05/2016   Mild persistent asthma with acute exacerbation 11/05/2016   Hypoglycemia 11/07/2015   OSA (obstructive sleep apnea) 11/07/2015   DM type 2 (diabetes mellitus, type 2) ([place]) 12/07/2014   Panniculitis 12/06/2014   Cellulitis, abdominal wall 11/11/2014   Abdominal pain 58/52/7782   Eosinophilic esophagitis 42/35/3614   Change in bowel habits 04/28/2013   RLS (restless legs syndrome) 08/08/2011   Adjustment disorder 43/15/4008   DYSMETABOLIC SYNDROME 67/61/9509  Essential hypertension 05/12/2006   Asthma 05/12/2006   OSTEOARTHRITIS 05/12/2006    Past Surgical History:  Procedure Laterality Date   BIOPSY  08/13/2018   Procedure: BIOPSY;  Surgeon: Yetta Flock, MD;  Location: [facility];  Service: Gastroenterology;;   CESAREAN SECTION MULTI-GESTATIONAL N/A 02/03/2017   Procedure: CESAREAN SECTION MULTI-GESTATIONAL;  Surgeon: Jonnie Kind, MD;  Location: [facility];  Service: Obstetrics;  Laterality: N/A;   CHOLECYSTECTOMY     COLONOSCOPY WITH PROPOFOL N/A 08/13/2018   Procedure: COLONOSCOPY WITH PROPOFOL;  Surgeon: Yetta Flock, MD;  Location: [facility]  END[facility]COPY;  Service: Gastroenterology;  Laterality: N/A;   DENTAL SURGERY     ESOPHAGOGASTRODUODENOSCOPY  May 2007   Dr. Gala Romney: Normal esophagus, stomach, D1, D2   ESOPHAGOGASTRODUODENOSCOPY  06/16/2013   Dr. Carlton Adam, eosinophilic esophagitis, reactive gastropathy, no esophageal dilation   ESOPHAGOGASTRODUODENOSCOPY (EGD) WITH PROPOFOL N/A 08/13/2018   Procedure: ESOPHAGOGASTRODUODENOSCOPY (EGD) WITH PROPOFOL;  Surgeon: Yetta Flock, MD;  Location: [facility];  Service: Gastroenterology;  Laterality: N/A;   POLYPECTOMY  08/13/2018   Procedure: POLYPECTOMY;  Surgeon: Yetta Flock, MD;  Location: [facility];  Service: Gastroenterology;;   TONSILLECTOMY     TOOTH EXTRACTION  10/28/2011   Procedure: DENTAL RESTORATION/EXTRACTIONS;  Surgeon: Gae Bon, DDS;  Location: [facility] OR;  Service: Oral Surgery;;   UPPER GASTROINTESTINAL ENDOSCOPY       OB History     Gravida  2   Para  1   Term  1   Preterm      AB  1   Living  2      SAB  0   IAB      Ectopic  1   Multiple  1   Live Births  2           Family History  Problem Relation Age of Onset   Depression Mother    Anxiety disorder Mother    High blood pressure Mother    Bipolar disorder Mother    Eating disorder Mother    Obesity Mother    Hypertension Sister    Allergic rhinitis Sister    Colon polyps Maternal Grandmother        30s   Diabetes Maternal Grandmother    Anxiety disorder Maternal Grandmother    COPD Maternal Grandmother    Crohn's disease Maternal Aunt    Cancer Maternal Grandfather        prostate   HIV/AIDS Father    Eating disorder Father    Obesity Father    Liver disease Neg Hx    Angioedema Neg Hx    Eczema Neg Hx    Immunodeficiency Neg Hx    Asthma Neg Hx    Urticaria Neg Hx    Colon cancer Neg Hx    Esophageal cancer Neg Hx    Rectal cancer Neg Hx    Stomach cancer Neg Hx     Social History   Tobacco Use   Smoking status: Former    Packs/day: 0.50     Years: 8.00    Pack years: 4.00    Types: Cigarettes    Quit date: 04/25/2011    Years since quitting: 9.7   Smokeless tobacco: Never  Vaping Use   Vaping Use: Never used  Substance Use Topics   Alcohol use: Not Currently    Comment: occasional "relapse" of a wine cooler   Drug use: Not Currently    Types: Marijuana    Home  Medications Prior to Admission medications   Medication Sig Start Date End Date Taking? Authorizing Provider  albuterol (VENTOLIN HFA) 108 (90 Base) MCG/ACT inhaler Inhale 2 puffs into the lungs every 4 (four) hours as needed for wheezing or shortness of breath. 12/27/20   Samella Parr, NP  ascorbic acid (VITAMIN C) 500 MG tablet Take 1 tablet (500 mg total) by mouth daily. 12/27/20   Samella Parr, NP  buPROPion (WELLBUTRIN XL) 300 MG 24 hr tablet Take 1 tablet (300 mg total) by mouth daily. 01/29/21   Patrecia Pour, NP  clonazePAM (KLONOPIN) 0.5 MG tablet Take 1/2 tablet (0.25 mg total) by mouth daily as needed (panic attacks). 01/04/21   Samella Parr, NP  clotrimazole (LOTRIMIN) 1 % cream Apply topically 2 (two) times daily. 11/02/20   Shelly Coss, MD  diclofenac Sodium (VOLTAREN) 1 % GEL Apply 4 g topically 4 (four) times daily. 12/27/20   Samella Parr, NP  fluticasone (FLONASE) 50 MCG/ACT nasal spray Place 2 sprays into both nostrils 2 (two) times daily. Patient taking differently: Place 2 sprays into both nostrils 2 (two) times daily as needed for allergies or rhinitis. 12/27/20   Samella Parr, NP  furosemide (LASIX) 40 MG tablet Take 1 tablet (40 mg total) by mouth daily. 01/04/21   Samella Parr, NP  methocarbamol (ROBAXIN) 500 MG tablet Take 1 tablet (500 mg total) by mouth 4 (four) times daily. 12/27/20   Samella Parr, NP  mometasone-formoterol (DULERA) 100-5 MCG/ACT AERO Inhale 2 puffs into the lungs 2 (two) times daily. 12/27/20   Samella Parr, NP  nystatin (MYCOSTATIN/NYSTOP) powder Apply topically 3 (three) times daily. 12/27/20    Samella Parr, NP  oxyCODONE (OXY IR/ROXICODONE) 5 MG immediate release tablet Take 1 tablet (5 mg total) by mouth every 4 (four) hours as needed for moderate pain. 12/27/20   Samella Parr, NP  potassium chloride SA (KLOR-CON) 20 MEQ tablet Take 1 tablet (20 mEq total) by mouth daily. 01/04/21   Samella Parr, NP  pregabalin (LYRICA) 100 MG capsule Take 1 capsule (100 mg total) by mouth 3 (three) times daily. 12/27/20   Samella Parr, NP  rOPINIRole (REQUIP) 4 MG tablet Take 1 tablet (4 mg total) by mouth at bedtime. 12/27/20   Samella Parr, NP  senna-docusate (SENOKOT-S) 8.6-50 MG tablet Take 2 tablets by mouth daily. 12/27/20   Samella Parr, NP  silver sulfADIAZINE (SILVADENE) 1 % cream Apply topically daily. Patient not taking: Reported on 01/26/2021 01/04/21   Samella Parr, NP  topiramate (TOPAMAX) 25 MG tablet Take 1 tablet (25 mg total) by mouth 2 (two) times daily. 12/27/20   Samella Parr, NP  traZODone (DESYREL) 50 MG tablet Take 1 tablet (50 mg total) by mouth at bedtime. 01/29/21   Patrecia Pour, NP  vitamin B-12 (CYANOCOBALAMIN) 1000 MCG tablet Take 1 tablet (1,000 mcg total) by mouth daily. 12/27/20   Samella Parr, NP  ziprasidone (GEODON) 20 MG capsule Take 1 capsule (20 mg total) by mouth daily. 01/29/21   Patrecia Pour, NP    Allergies    Amoxicillin, Bee venom, Penicillin g, Penicillins, Adhesive [tape], Latex, and Vancomycin  Review of Systems   Review of Systems  Gastrointestinal:  Positive for diarrhea.  Psychiatric/Behavioral:  Positive for suicidal ideas.   All other systems reviewed and are negative.  Physical Exam Updated Vital Signs BP (!) 169/103   Pulse 99  Temp 98.7 F (37.1 C) (Oral)   Resp 20   SpO2 100%   Physical Exam Vitals and nursing note reviewed.  Constitutional:      Appearance: Normal appearance. She is obese.  HENT:     Head: Normocephalic and atraumatic.     Right Ear: External ear normal.     Left Ear:  External ear normal.     Nose: Nose normal.     Mouth/Throat:     Mouth: Mucous membranes are dry.  Eyes:     Extraocular Movements: Extraocular movements intact.     Conjunctiva/sclera: Conjunctivae normal.     Pupils: Pupils are equal, round, and reactive to light.  Cardiovascular:     Rate and Rhythm: Normal rate and regular rhythm.     Pulses: Normal pulses.     Heart sounds: Normal heart sounds.  Pulmonary:     Effort: Pulmonary effort is normal.     Breath sounds: Normal breath sounds.  Abdominal:     General: Abdomen is flat. Bowel sounds are normal.     Palpations: Abdomen is soft.  Musculoskeletal:        General: Normal range of motion.     Cervical back: Normal range of motion and neck supple.  Skin:    General: Skin is warm.     Capillary Refill: Capillary refill takes less than 2 seconds.  Neurological:     General: No focal deficit present.     Mental Status: She is alert and oriented to person, place, and time.  Psychiatric:        Thought Content: Thought content includes suicidal ideation. Thought content includes suicidal plan.    ED Results / Procedures / Treatments   Labs (all labs ordered are listed, but only abnormal results are displayed) Labs Reviewed  COMPREHENSIVE METABOLIC PANEL - Abnormal; Notable for the following components:      Result Value   Glucose, Bld 102 (*)    BUN 37 (*)    Creatinine, Ser 1.32 (*)    Total Protein 8.9 (*)    GFR, Estimated 51 (*)    All other components within normal limits  CBC WITH DIFFERENTIAL/PLATELET - Abnormal; Notable for the following components:   WBC 19.2 (*)    MCH 25.5 (*)    MCHC 29.7 (*)    RDW 18.3 (*)    Platelets 488 (*)    Neutro Abs 15.9 (*)    Abs Immature Granulocytes 0.19 (*)    All other components within normal limits  MAGNESIUM - Abnormal; Notable for the following components:   Magnesium 2.5 (*)    All other components within normal limits  GASTROINTESTINAL PANEL BY PCR, STOOL  (REPLACES STOOL CULTURE)  C DIFFICILE QUICK SCREEN W PCR REFLEX    RESP PANEL BY RT-PCR (FLU A&B, COVID) [id number]  URINALYSIS, ROUTINE W REFLEX MICROSCOPIC    EKG None  Radiology No results found.  Procedures Procedures   Medications Ordered in ED Medications  albuterol (VENTOLIN HFA) 108 (90 Base) MCG/ACT inhaler 2 puff (has no administration in time range)  buPROPion (WELLBUTRIN XL) 24 hr tablet 300 mg (has no administration in time range)  clonazePAM (KLONOPIN) tablet 0.25 mg (has no administration in time range)  fluticasone (FLONASE) 50 MCG/ACT nasal spray 2 spray (has no administration in time range)  furosemide (LASIX) tablet 40 mg (has no administration in time range)  methocarbamol (ROBAXIN) tablet 500 mg (has no administration in time range)  mometasone-formoterol (DULERA) 100-5  MCG/ACT inhaler 2 puff (has no administration in time range)  pregabalin (LYRICA) capsule 100 mg (has no administration in time range)  rOPINIRole (REQUIP) tablet 4 mg (has no administration in time range)  senna-docusate (Senokot-S) tablet 2 tablet (has no administration in time range)  topiramate (TOPAMAX) tablet 25 mg (has no administration in time range)  traZODone (DESYREL) tablet 50 mg (has no administration in time range)  ziprasidone (GEODON) capsule 20 mg (has no administration in time range)    ED Course  I have reviewed the triage vital signs and the nursing notes.  Pertinent labs & imaging results that were available during my care of the patient were reviewed by me and considered in my medical decision making (see chart for details).    MDM Rules/Calculators/A&P                           Pt could possibly have c.diff due to recent antibiotic use.  Stool sample still pending.  I strongly suspect malingering as she has no where to stay.  However, she reports a plan, so TTS has been consulted.  Dispo per TTS.  Final Clinical Impression(s) / ED Diagnoses Final diagnoses:   Suicidal ideation  Homelessness  Diarrhea, unspecified type    Rx / DC Orders ED Discharge Orders     None        Isla Pence, MD 02/05/21 2242

## 2021-02-05 NOTE — ED Triage Notes (Addendum)
Pt has been waiting in the lobby and states that she is to see SW this morning. Pt has been seen and discharged multiple times with resources. She is checking back in stating that she is too weak to to walk. Pt walked to triage room with personal walker without assistance.

## 2021-02-05 NOTE — ED Triage Notes (Signed)
Pt returns again stating that she has diarrhea and is stating she is suicidal. Pt is homeless.

## 2021-02-05 NOTE — ED Notes (Signed)
Pt states she plans to kill herself  by cutting herself with glass. Pt states that she attempted SI 7 years ago when she lost her first pregnancy

## 2021-02-06 DIAGNOSIS — M545 Low back pain, unspecified: Secondary | ICD-10-CM | POA: Diagnosis not present

## 2021-02-06 LAB — RESP PANEL BY RT-PCR (FLU A&B, COVID) ARPGX2
Influenza A by PCR: NEGATIVE
Influenza B by PCR: NEGATIVE
SARS Coronavirus 2 by RT PCR: NEGATIVE

## 2021-02-06 NOTE — ED Notes (Addendum)
Pt care taken, no complaints at this time. 

## 2021-02-06 NOTE — ED Notes (Signed)
Per RN at [facility], pt cannot go there due to bed weight limits. Informed pt of this. Pt vocalized understanding. Pt has been calm and cooperative throughout the shift.

## 2021-02-06 NOTE — ED Notes (Signed)
Pt moved to TR5 to have privacy for TTS consult at this time
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
## 2021-02-06 NOTE — BH Assessment (Addendum)
[facility] Assessment Progress Note   Per Lindon Romp, NP, this pt requires psychiatric hospitalization at this time.  The following facilities have been contacted to seek placement for this pt, with results as noted:  Beds available, information sent, decision pending: [place] to reach: [facility] (left message at 15:29)  Declined: [facility] (can only accept up to 350 lbs) [facility] (weight and walker are exclusionary) [facility] (after initially accepting pt; cannot provide bariatric care)  At capacity: [facility]  If this voluntary pt is accepted to a facility, please discuss disposition with pt to be sure that she agrees to the plan.  If a facility agrees to accept pt and the plan changes in any way please call the facility to inform them of the change.  Final disposition is pending as of this writing.  Jalene Mullet, [place] Coordinator [phone]

## 2021-02-06 NOTE — BH Assessment (Addendum)
[facility] Assessment Progress Note   Per Lindon Romp, NP, this pt requires psychiatric hospitalization at this time.  At 12:09 [facility] calls from [facility].  Pt has been accepted to their facility by Dr Jonelle Sports to their main campus.  They are now ready to receive pt.  Pecolia Ades, NP, concurs with this disposition, as does the pt who is currently under voluntary status.  EDP Lavenia Atlas, DO and pt's nurse, Meghan, have been notified, and Meghan agrees to call report to [phone].  Pt is to be transported via [facility].  Transportation form for patients traveling over 75 miles has been completed and sent to the Transportation Department.  Jalene Mullet, [place] [phone]  Addendum:  Pt's nurse, Meghan, reports that when she tried to give nursing report she was told that they cannot accommodate bariatric patients.  I called back to [facility], who confirms that this is true.  Bed is no longer available, and this Probation officer will resume search for placement.  Jalene Mullet, [place] Coordinator [phone]

## 2021-02-06 NOTE — ED Provider Notes (Signed)
Emergency Medicine Observation Re-evaluation Note  Heather [name] is a 45 y.o. female, seen on rounds today.  Pt initially presented to the ED for complaints of Suicidal and Homeless Currently, the patient is resting.  Physical Exam  BP (!) 171/70   Pulse 91   Temp 98.7 F (37.1 C) (Oral)   Resp 20   SpO2 97%  Physical Exam General: resting comfortably, NAD Lungs: normal WOB Psych: currently calm and resting  ED Course / MDM  EKG:   I have reviewed the labs performed to date as well as medications administered while in observation.  Recent changes in the last 24 hours include none.  Plan  Current plan is for inpatient placement.  Amarii Amy Vignola is not under involuntary commitment.     Lorelle Gibbs, DO 02/06/21 [id number]

## 2021-02-07 ENCOUNTER — Encounter (HOSPITAL_COMMUNITY): Payer: Self-pay | Admitting: *Deleted

## 2021-02-07 ENCOUNTER — Encounter (HOSPITAL_COMMUNITY): Payer: Self-pay | Admitting: Registered Nurse

## 2021-02-07 ENCOUNTER — Emergency Department (HOSPITAL_COMMUNITY): Payer: 59

## 2021-02-07 ENCOUNTER — Emergency Department (HOSPITAL_COMMUNITY)
Admission: EM | Admit: 2021-02-07 | Discharge: 2021-02-08 | Disposition: A | Discharge status: Home / Self Care | Payer: 59 | Attending: Emergency Medicine | Admitting: Emergency Medicine

## 2021-02-07 ENCOUNTER — Ambulatory Visit (HOSPITAL_COMMUNITY)
Admission: EM | Admit: 2021-02-07 | Discharge: 2021-02-07 | Disposition: A | Discharge status: Home / Self Care | Payer: 59 | Attending: Registered Nurse | Admitting: Registered Nurse

## 2021-02-07 ENCOUNTER — Other Ambulatory Visit: Payer: Self-pay

## 2021-02-07 DIAGNOSIS — R0602 Shortness of breath: Secondary | ICD-10-CM | POA: Diagnosis not present

## 2021-02-07 DIAGNOSIS — Z20822 Contact with and (suspected) exposure to covid-19: Secondary | ICD-10-CM | POA: Diagnosis not present

## 2021-02-07 DIAGNOSIS — E1122 Type 2 diabetes mellitus with diabetic chronic kidney disease: Secondary | ICD-10-CM | POA: Diagnosis not present

## 2021-02-07 DIAGNOSIS — Z87891 Personal history of nicotine dependence: Secondary | ICD-10-CM | POA: Diagnosis not present

## 2021-02-07 DIAGNOSIS — R45851 Suicidal ideations: Secondary | ICD-10-CM

## 2021-02-07 DIAGNOSIS — D72829 Elevated white blood cell count, unspecified: Secondary | ICD-10-CM | POA: Diagnosis not present

## 2021-02-07 DIAGNOSIS — R059 Cough, unspecified: Secondary | ICD-10-CM | POA: Insufficient documentation

## 2021-02-07 DIAGNOSIS — J4531 Mild persistent asthma with (acute) exacerbation: Secondary | ICD-10-CM | POA: Insufficient documentation

## 2021-02-07 DIAGNOSIS — N390 Urinary tract infection, site not specified: Secondary | ICD-10-CM | POA: Insufficient documentation

## 2021-02-07 DIAGNOSIS — R0789 Other chest pain: Secondary | ICD-10-CM | POA: Insufficient documentation

## 2021-02-07 DIAGNOSIS — F331 Major depressive disorder, recurrent, moderate: Secondary | ICD-10-CM

## 2021-02-07 DIAGNOSIS — Z7951 Long term (current) use of inhaled steroids: Secondary | ICD-10-CM | POA: Insufficient documentation

## 2021-02-07 DIAGNOSIS — M545 Low back pain, unspecified: Secondary | ICD-10-CM | POA: Diagnosis not present

## 2021-02-07 DIAGNOSIS — F411 Generalized anxiety disorder: Secondary | ICD-10-CM

## 2021-02-07 DIAGNOSIS — Z659 Problem related to unspecified psychosocial circumstances: Secondary | ICD-10-CM

## 2021-02-07 DIAGNOSIS — Z91040 Latex allergy status: Secondary | ICD-10-CM | POA: Diagnosis not present

## 2021-02-07 DIAGNOSIS — Z5900 Homelessness unspecified: Secondary | ICD-10-CM

## 2021-02-07 DIAGNOSIS — F418 Other specified anxiety disorders: Secondary | ICD-10-CM | POA: Diagnosis present

## 2021-02-07 DIAGNOSIS — Z79899 Other long term (current) drug therapy: Secondary | ICD-10-CM | POA: Insufficient documentation

## 2021-02-07 DIAGNOSIS — I129 Hypertensive chronic kidney disease with stage 1 through stage 4 chronic kidney disease, or unspecified chronic kidney disease: Secondary | ICD-10-CM | POA: Insufficient documentation

## 2021-02-07 DIAGNOSIS — J449 Chronic obstructive pulmonary disease, unspecified: Secondary | ICD-10-CM | POA: Diagnosis not present

## 2021-02-07 DIAGNOSIS — N183 Chronic kidney disease, stage 3 unspecified: Secondary | ICD-10-CM | POA: Diagnosis not present

## 2021-02-07 LAB — CBC WITH DIFFERENTIAL/PLATELET
Abs Immature Granulocytes: 0.34 10*3/uL — ABNORMAL HIGH (ref 0.00–0.07)
Basophils Absolute: 0.1 10*3/uL (ref 0.0–0.1)
Basophils Relative: 1 %
Eosinophils Absolute: 0.5 10*3/uL (ref 0.0–0.5)
Eosinophils Relative: 3 %
HCT: 40.4 % (ref 36.0–46.0)
Hemoglobin: 12.3 g/dL (ref 12.0–15.0)
Immature Granulocytes: 2 %
Lymphocytes Relative: 19 %
Lymphs Abs: 3.2 10*3/uL (ref 0.7–4.0)
MCH: 25.5 pg — ABNORMAL LOW (ref 26.0–34.0)
MCHC: 30.4 g/dL (ref 30.0–36.0)
MCV: 83.8 fL (ref 80.0–100.0)
Monocytes Absolute: 0.9 10*3/uL (ref 0.1–1.0)
Monocytes Relative: 5 %
Neutro Abs: 12 10*3/uL — ABNORMAL HIGH (ref 1.7–7.7)
Neutrophils Relative %: 70 %
Platelets: 519 10*3/uL — ABNORMAL HIGH (ref 150–400)
RBC: 4.82 MIL/uL (ref 3.87–5.11)
RDW: 17.7 % — ABNORMAL HIGH (ref 11.5–15.5)
WBC: 17 10*3/uL — ABNORMAL HIGH (ref 4.0–10.5)
nRBC: 0 % (ref 0.0–0.2)

## 2021-02-07 NOTE — ED Provider Notes (Signed)
Behavioral Health Urgent Care Medical Screening Exam  Patient Name: Heather [name] MRN: [id number] Date of Evaluation: 02/07/21 Chief Complaint:   Diagnosis:  Final diagnoses:  Homelessness  Major depressive disorder, recurrent episode, moderate ([place])  Generalized anxiety disorder    History of Present illness: Heather [name] is a 45 y.o. female patient who presents as a walk in at [facility] after an earlier discharge from [facility] ED.  Patient seen face to face by this provider, consulted with Dr. Ernie Hew; and chart reviewed on 02/07/21.  On evaluation Heather [name] admits multiple visits to ED in las 3-4 days with same or similar complaint.  Reporting her primary stressor as homelessness, lack of support and resources. Patient reporting she was discharged from [facility] ED before she was towed or treated for her urinary tract infection.  Patient states every time she goes to the emergency room they try to make it a psychiatric visit when she is there for medical problems.  Patient reports that she went to the emergency room because of a urinary tract infection.  Patient also states that she is homeless and she is looking for emergency placement.  Patient was brought to urgent care by her mother who has since left been going to church.  Patient informed she was allowed to wait until she could get in touch with her mother to come back and pick her up.  Patient denies suicidal/self-harm/homicidal ideations, psychosis, and paranoia.  Patient is aware that she has an appointment set up with Gust Rung for medication management in January. During evaluation Heather [name] is sitting in chair in no acute distress.  She is alert/oriented x 4; calm/cooperative; and mood congruent with affect.  She is speaking in a clear tone at moderate volume, and normal pace; with good eye contact.  Her thought process is coherent and relevant; There is no indication that she is currently responding to  internal/external stimuli or experiencing delusional thought content; and she has denied suicidal/self-harm/homicidal ideation, psychosis, and paranoia.   Patient has remained calm throughout assessment and has answered questions appropriately.    Per earlier assessment not by PMHNP at [facility] ED Heather [name] is a 45 y.o. female patient admitted with suicidal ideations and multiple psychosocial issues to include homelessness, lack of support, and lack of resources. Patient states she is tired of being denied by facilities and struggles daily with placement issues. She admits that her health and decision making have led to her weight of 194.4kg, and she is no longer able to take care of herself independently. She has presented to the ED approximately 4x (10/30x2, 10/31 x2) for homelessness. Her weight combined with her inability to care for herself (1:1 staffing), has limited placement options. This has been discussed with the patient at this time, and we will attempt to provide outpatient resources and identify long term facility options. She states due to her recent state of depression she has been engaging in risky behaviors such as relapsing on pain medication (UDS negative) and alcohol(BAL <10) , hooking up with men online. She states this is why she is no longer safe to be around herself, not that she is suicidal but she is placing herself in harms way. She currently denies suicidal ideations, homicidal ideations and or hallucinations at this time. She is able to contract for safety if resources are made available and appropriate housing is found.  We discussed at length about [facility] and [facility], again emphasis was  placed on her lack of ability to provide total care to a patient. Discussed with her that even though she is homeless, admitting to inpatient behavioral health, would not solve her long term problem of housing. Discussed with patient the importance of pursuing a long  term care facility and or group home that provides total care to patients. She is observed to be crying, support, reassurance and encouragement is offered. As noted above patient is here for the sole purposes of housing and malingering which is situational at this time. She remains a low threat to harm herself, and seems motivated to seek treatment at this time.    HPI:  10/31 2nd visit: Pt presents to the ED today with suicidal ideas and a plan.  She wants to cut herself with glass.  Pt also c/o diarrhea and homelessness.  Pt has been here frequently recently.  She was admitted for pyelo from 10/20-10/30.  She is homeless and was unable to be placed.  She came back to the hospital twice after discharge on the 30th.  The pt was also here this morning.  SW saw her and said there is nothing they can do.  The patient is back again today and now she is saying she is suicidal.  She is also still complaining of diarrhea.    10/31 1st visit: sat in the hospital overnight requesting to speak to SW.    Hospital admission 10/20-10/30 discharge note " Patient discharged from unit. Patient reports that she is homeless. Patient states her mother will be picking her up and/ or if not that she will arrange her transportation."    10/30:One hour after discharge patient presented to [facility] for a second opinion. She states she has a PASSR form and needs to be placed.    Psychiatric Specialty Exam  Presentation  General Appearance:Appropriate for Environment; Casual  Eye Contact:Good  Speech:Clear and Coherent; Normal Rate  Speech Volume:Normal  Handedness:Right   Mood and Affect  Mood:Dysphoric  Affect:Appropriate; Congruent   Thought Process  Thought Processes:Coherent; Goal Directed  Descriptions of Associations:Intact  Orientation:Full (Time, Place and Person)  Thought Content:WDL  Diagnosis of Schizophrenia or Schizoaffective disorder in past: No   Hallucinations:None heard voices this AM not  sure what they were saying, patient not very descriptive  Ideas of Reference:None  Suicidal Thoughts:No With Plan; With Access to Means With Plan  Homicidal Thoughts:No   Sensorium  Memory:Immediate Good; Recent Good; Remote Good  Judgment:Intact  Insight:Fair; Present   Executive Functions  Concentration:Good  Attention Span:Good  [place]  Language:Good   Psychomotor Activity  Psychomotor Activity:Normal   Assets  Assets:Communication Skills; Desire for Improvement; Social Support   Sleep  Sleep:Fair  Number of hours: 6   Nutritional Assessment (For OBS and FBC admissions only) Has the patient had a weight loss or gain of 10 pounds or more in the last 3 months?: No Has the patient had a decrease in food intake/or appetite?: No Does the patient have dental problems?: No Does the patient have eating habits or behaviors that may be indicators of an eating disorder including binging or inducing vomiting?: No Has the patient recently lost weight without trying?: 0 Has the patient been eating poorly because of a decreased appetite?: 0 Malnutrition Screening Tool Score: 0   Physical Exam: Physical Exam Vitals and nursing note reviewed. Exam conducted with a chaperone present.  Constitutional:      General: She is not in acute distress.  Appearance: Normal appearance. She is obese. She is not ill-appearing.  HENT:     Head: Normocephalic.  Eyes:     Pupils: Pupils are equal, round, and reactive to light.  Cardiovascular:     Rate and Rhythm: Normal rate.  Pulmonary:     Effort: Pulmonary effort is normal.  Musculoskeletal:        General: Normal range of motion.     Cervical back: Normal range of motion.  Skin:    General: Skin is warm and dry.  Neurological:     Mental Status: She is alert and oriented to person, place, and time.  Psychiatric:        Attention and Perception: Attention and perception normal. She does  not perceive auditory or visual hallucinations.        Mood and Affect: Affect normal. Mood is depressed.        Speech: Speech normal.        Behavior: Behavior normal. Behavior is cooperative.        Thought Content: Thought content normal. Thought content is not paranoid or delusional. Thought content does not include homicidal or suicidal ideation.        Cognition and Memory: Cognition and memory normal.        Judgment: Judgment normal.   Review of Systems  Constitutional:  Positive for malaise/fatigue.  Genitourinary:  Positive for dysuria, frequency and urgency.       Reports she went to emergency room related to urinary tract infection but wasn't treated.   Psychiatric/Behavioral:  Depression: Stable. Hallucinations: Denies. Substance abuse: Denies. Suicidal ideas: Denies. The patient is nervous/anxious.   Blood pressure 133/71, pulse 96, temperature 98.9 F (37.2 C), temperature source Oral, resp. rate 16, SpO2 99 %. There is no height or weight on file to calculate BMI.  Musculoskeletal: Strength & Muscle Tone: within normal limits Gait & Station:  Uses walker for ambulation Patient leans: N/A   [facility] [facility] Discharge Disposition for Follow up and Recommendations: Based on my evaluation the patient does not appear to have an emergency medical condition and can be discharged with resources and follow up care in outpatient services for Medication Management, Partial Hospitalization Program, Individual Therapy, and Group Therapy    Discharge Instructions      Keep scheduled appointment with Waylan Boga, NP for medication management.  Follow up with primary or go to medical urgent care, ED for urinary tract infection      Heather Belisle, NP 02/07/2021, 6:56 PM

## 2021-02-07 NOTE — ED Provider Notes (Signed)
Emergency Medicine Observation Re-evaluation Note  Heather [name] is a 45 y.o. female, seen on rounds today.  Pt initially presented to the ED for complaints of Suicidal and Homeless Patient presented 2 days ago for suicidal ideation with plan.  Currently, the patient is awaiting psychiatric hospitalization.  Physical Exam  BP 106/74 (BP Location: Right Arm)   Pulse 75   Temp 98.4 F (36.9 C) (Oral)   Resp 20   Ht 5\' 3"  (1.6 m)   Wt (!) 194.4 kg   SpO2 97%   BMI 75.92 kg/m  Physical Exam General: Awake, alert Cardiac: Normal rate and rhythm Lungs: Breathing is even and unlabored Psych: No agitation, not responding to internal stimuli  ED Course / MDM  EKG:   I have reviewed the labs performed to date as well as medications administered while in observation.  Recent changes in the last 24 hours include awaiting placement at psychiatric facility.  Plan  Current plan is for inpatient psychiatric admission.  Heather [name] is not under involuntary commitment.     Godfrey Pick, MD 02/07/21 [phone]

## 2021-02-07 NOTE — Discharge Summary (Signed)
Heather [name] to be D/C'd Home per NP order. Discussed with the patient and all questions fully answered. An After Visit Summary was printed and given to the patient. Patient escorted out, and D/C home via private auto.  [name]  02/07/2021 6:49 PM

## 2021-02-07 NOTE — Progress Notes (Addendum)
CSW was consulted on pt to seek LTC placement , pt has been psych cleared. TOC has attempted to place pt in LTC before , pt has no income and is aware of her barriers in getting placement. CSW attached shelter list to pt's AVS pt was [facility] about [facility]. CSW provided [facility] contact information to get on their waiting list , as they start accepting individuals on Nov 7th.   Pt stated the police told her she should not be on the streets. CSW informed pt again about her barriers to placement. Pt reports applying for SS in September and application is pending. Pt stated she has not contacted Social Service for assistance yet. Pt is requesting a ride to [place] to speak with her Education officer, museum. CSW to contact transportation for ride upon d/c.  Adden 12:00pm CSW [employer] they reported not having a van for pt until sometime after 3 pm. CSW contacted [employer], and requested a Lucianne Lei for pt upon d/c. CSW provided pt with cab voucher to DSS. Pt stated " why are you rushing me" and then requested to go to [facility] on [address]. CSW informed pt the cab is on its way to take her to [address] as she requested earlier. Pt refused Cab ride and stated her mother would come to pick her up at 330pm. Pt was informed she is d/c and will need to wait in the waiting area for her ride. CSW asked for cab voucher back, pt stated "I don't know where it is, it must be in the trash". CSW spoke with taxi driver and provided copy of voucher.     Heather [name].Heather [name], MSW, [place]  Transitions of Care Clinical Social Worker I Direct Dial: [phone]  Fax: [phone] [email or web address]

## 2021-02-07 NOTE — ED Provider Notes (Signed)
Emergency Medicine Provider Triage Evaluation Note  Heather [name] , a 45 y.o. female  was evaluated in triage.  Pt complains of chest pain all day today and coughing up  "green stuff".  Also states odor "down there" and feels like infection is back.  Currently homeless and has been at [facility].  Review of Systems  Positive: Chest pain, cough Negative: fever  Physical Exam  BP 133/90   Pulse 93   Temp 98.8 F (37.1 C) (Oral)   Resp 16   SpO2 96%  Gen:   Awake, no distress   Resp:  Normal effort  MSK:   Moves extremities without difficulty  Other:    Medical Decision Making  Medically screening exam initiated at 10:50 PM.  Appropriate orders placed.  Danie Hannig Creed was informed that the remainder of the evaluation will be completed by another provider, this initial triage assessment does not replace that evaluation, and the importance of remaining in the ED until their evaluation is complete.  Cough with chest pain.  EKG, labs, CXR, covid screen, UA.   Larene Pickett, PA-C 02/07/21 2251    Wyvonnia Dusky, MD 02/08/21 [phone]

## 2021-02-07 NOTE — ED Notes (Signed)
Pt went to the bathroom, linens changed on bed,offered something to drink, and pt laid back down comfortable

## 2021-02-07 NOTE — ED Triage Notes (Signed)
Pt states chest tightness, spitting up "green stuff".

## 2021-02-07 NOTE — BH Assessment (Signed)
[facility] Assessment Progress Note  Per Sheran Fava, NP, this voluntary pt does not require psychiatric hospitalization at this time.  Pt is psychiatrically cleared.  Discharge instructions advise pt to continue treatment with [facility].  Her next virtual appointment is scheduled for Monday, 04/23/2021 at 10:30, which has been included in discharge instructions.  A TOC consult has been ordered to address pt's psychosocial needs.  EDP Godfrey Pick, MD, Lu Duffel, LCSW and pt's nurse, Legrand Como, have been notified.  Jalene Mullet, [place] Triage Specialist [phone]

## 2021-02-07 NOTE — BH Assessment (Signed)
TTS talked with pt about enhanced services such as CST to help with her psychosocial needs including housing. TTS told pt that she may qualify for services and explained the process of receiving such services. TTS informed pt that the process would take some time but it could be beneficial if approved for services. TTS informed pt that CST services included case workers and mental health professionals in the community to help with linkage to services and following up. Pt consents for TTS to send referrals to [employer] and [employer] for [employer].

## 2021-02-07 NOTE — Consult Note (Signed)
[facility] Consult   Reason for Consult:  Suicidal ideations and homelessness Referring Physician:   Patient Identification: Heather [name] MRN:  [id number] Principal Diagnosis: <principal problem not specified> Diagnosis:  Active Problems:   * No active hospital problems. *   Total Time spent with patient: 30 minutes  Subjective:   Heather [name] is a 45 y.o. female patient admitted with suicidal ideations and multiple psychosocial issues to include homelessness, lack of support, and lack of resources. Patient states she is tired of being denied by facilities and struggles daily with placement issues. She admits that her health and decision making have led to her weight of 194.4kg, and she is no longer able to take care of herself independently. She has presented to the ED approximately 4x (10/30x2, 10/31 x2) for homelessness. Her weight combined with her inability to care for herself (1:1 staffing), has limited placement options. This has been discussed with the patient at this time, and we will attempt to provide outpatient resources and identify long term facility options. She states due to her recent state of depression she has been engaging in risky behaviors such as relapsing on pain medication (UDS negative) and alcohol(BAL <10) , hooking up with men online. She states this is why she is no longer safe to be around herself, not that she is suicidal but she is placing herself in harms way. She currently denies suicidal ideations, homicidal ideations and or hallucinations at this time. She is able to contract for safety if resources are made available and appropriate housing is found.   We discussed at length about [facility] and [facility], again emphasis was placed on her lack of ability to provide total care to a patient. Discussed with her that even though she is homeless, admitting to inpatient behavioral health, would not solve her long term  problem of housing. Discussed with patient the importance of pursuing a long term care facility and or group home that provides total care to patients. She is observed to be crying, support, reassurance and encouragement is offered. As noted above patient is here for the sole purposes of housing and malingering which is situational at this time. She remains a low threat to harm herself, and seems motivated to seek treatment at this time.   HPI:  10/31 2nd visit: Pt presents to the ED today with suicidal ideas and a plan.  She wants to cut herself with glass.  Pt also c/o diarrhea and homelessness.  Pt has been here frequently recently.  She was admitted for pyelo from 10/20-10/30.  She is homeless and was unable to be placed.  She came back to the hospital twice after discharge on the 30th.  The pt was also here this morning.  SW saw her and said there is nothing they can do.  The patient is back again today and now she is saying she is suicidal.  She is also still complaining of diarrhea.   10/31 1st visit: sat in the hospital overnight requesting to speak to SW.   Hospital admission 10/20-10/30 discharge note " Patient discharged from unit. Patient reports that she is homeless. Patient states her mother will be picking her up and/ or if not that she will arrange her transportation."   10/30:One hour after discharge patient presented to [facility] for a second opinion. She states she has a PASSR form and needs to be placed.    Past Psychiatric History: Depression, anxiety  Risk to Self:  Denies Risk to Others:  Denies Prior Inpatient Therapy:   Prior Outpatient Therapy:    Past Medical History:  Past Medical History:  Diagnosis Date   Anxiety    Arthritis    Asthma    Back pain    Constipation    COPD (chronic obstructive pulmonary disease) ([place])    Depression    Diabetes mellitus without complication ([place])    patient denies but states she has hyperglycemia-diet controlled   Dysrhythmia     DR Johnsie Cancel     Ectopic pregnancy 2683   Eosinophilic esophagitis    Diagnosed at [facility]/16/2013, untreated   GERD (gastroesophageal reflux disease)    HEARTBURN   TUMS   High cholesterol    IBS (irritable bowel syndrome)    Leukocytosis 07/28/2008   Qualifier: Diagnosis of  By: Jonna Munro MD, Cornelius     Morbid obesity ([place])    Neuromuscular disorder ([place])    RESTLESS LEG    Obesity    Schizoaffective disorder, bipolar type ([place])    Sleep apnea    CPAP- in process of restarting     Past Surgical History:  Procedure Laterality Date   BIOPSY  08/13/2018   Procedure: BIOPSY;  Surgeon: Yetta Flock, MD;  Location: [facility];  Service: Gastroenterology;;   CESAREAN SECTION MULTI-GESTATIONAL N/A 02/03/2017   Procedure: [place];  Surgeon: Jonnie Kind, MD;  Location: [facility];  Service: Obstetrics;  Laterality: N/A;   CHOLECYSTECTOMY     COLONOSCOPY WITH PROPOFOL N/A 08/13/2018   Procedure: COLONOSCOPY WITH PROPOFOL;  Surgeon: Yetta Flock, MD;  Location: [facility];  Service: Gastroenterology;  Laterality: N/A;   DENTAL SURGERY     ESOPHAGOGASTRODUODENOSCOPY  May 2007   Dr. Gala Romney: Normal esophagus, stomach, D1, D2   ESOPHAGOGASTRODUODENOSCOPY  06/16/2013   Dr. Carlton Adam, eosinophilic esophagitis, reactive gastropathy, no esophageal dilation   ESOPHAGOGASTRODUODENOSCOPY (EGD) WITH PROPOFOL N/A 08/13/2018   Procedure: ESOPHAGOGASTRODUODENOSCOPY (EGD) WITH PROPOFOL;  Surgeon: Yetta Flock, MD;  Location: [facility];  Service: Gastroenterology;  Laterality: N/A;   POLYPECTOMY  08/13/2018   Procedure: POLYPECTOMY;  Surgeon: Yetta Flock, MD;  Location: [facility];  Service: Gastroenterology;;   TONSILLECTOMY     TOOTH EXTRACTION  10/28/2011   Procedure: DENTAL RESTORATION/EXTRACTIONS;  Surgeon: Gae Bon, DDS;  Location: [facility];  Service: Oral Surgery;;   UPPER GASTROINTESTINAL ENDOSCOPY     Family History:   Family History  Problem Relation Age of Onset   Depression Mother    Anxiety disorder Mother    High blood pressure Mother    Bipolar disorder Mother    Eating disorder Mother    Obesity Mother    Hypertension Sister    Allergic rhinitis Sister    Colon polyps Maternal Grandmother        37s   Diabetes Maternal Grandmother    Anxiety disorder Maternal Grandmother    COPD Maternal Grandmother    Crohn's disease Maternal Aunt    Cancer Maternal Grandfather        prostate   HIV/AIDS Father    Eating disorder Father    Obesity Father    Liver disease Neg Hx    Angioedema Neg Hx    Eczema Neg Hx    Immunodeficiency Neg Hx    Asthma Neg Hx    Urticaria Neg Hx    Colon cancer Neg Hx    Esophageal cancer Neg Hx    Rectal cancer Neg Hx  Stomach cancer Neg Hx    Family Psychiatric  History: Denies Social History:  Social History   Substance and Sexual Activity  Alcohol Use Not Currently   Comment: occasional "relapse" of a wine cooler     Social History   Substance and Sexual Activity  Drug Use Not Currently   Types: Marijuana    Social History   Socioeconomic History   Marital status: Legally Separated    Spouse name: Gwyndolyn Saxon   Number of children: 0   Years of education: Not on file   Highest education level: Not on file  Occupational History   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 had a pack the other day  Other Topics Concern   Not on  file  Social History Narrative   Not on file   Social Determinants of Health   Financial Resource Strain: Not on file  Food Insecurity: Not on file  Transportation Needs: Not on file  Physical Activity: Not on file  Stress: Not on file  Social Connections: Not on file   Additional Social History:    Allergies:   Allergies  Allergen Reactions   Amoxicillin Anaphylaxis   Bee Venom Shortness Of Breath and Swelling   Penicillin G Anaphylaxis   Penicillins Anaphylaxis    Tolerates cephalexin and ceftriaxone Has patient had a PCN reaction causing immediate rash, facial/tongue/throat swelling, SOB or lightheadedness with hypotension: No Has patient had a PCN reaction causing severe rash involving mucus membranes or skin necrosis: No Has patient had a PCN reaction that required hospitalization No Has patient had a PCN reaction occurring within the last 10 years: No If all of the above answers are "NO", then may proceed with Cephalosporin use.'  REACTION: Angioedema   Adhesive [Tape] Itching and Rash   Latex Itching and Rash   Vancomycin Other (See Comments)    Pt can tolerate Vancomycin but did cause Vancomycin Infusion Reaction.  Recommend to pre-medicate with Benadryl before doses administered.      Labs:  Results for orders placed or performed during the hospital encounter of 02/05/21 (from the past 48 hour(s))  Comprehensive metabolic panel     Status: Abnormal   Collection Time: 02/05/21  7:55 PM  Result Value Ref Range   Sodium 137 135 - 145 mmol/L   Potassium 3.7 3.5 - 5.1 mmol/L   Chloride 104 98 - 111 mmol/L   CO2 23 22 - 32 mmol/L   Glucose, Bld 102 (H) 70 - 99 mg/dL    Comment: Glucose reference range applies only to samples taken after fasting for at least 8 hours.   BUN 37 (H) 6 - 20 mg/dL   Creatinine, Ser 1.32 (H) 0.44 - 1.00 mg/dL   Calcium 9.9 8.9 - 10.3 mg/dL   Total Protein 8.9 (H) 6.5 - 8.1 g/dL   Albumin 3.9 3.5 -  5.0 g/dL   AST 16 15 - 41 U/L   ALT  19 0 - 44 U/L   Alkaline Phosphatase 76 38 - 126 U/L   Total Bilirubin 0.3 0.3 - 1.2 mg/dL   GFR, Estimated 51 (L) >60 mL/min    Comment: (NOTE) Calculated using the CKD-EPI Creatinine Equation (2021)    Anion gap 10 5 - 15    Comment: Performed at [facility], [place] [address], [place], [place] [address]  CBC with Differential     Status: Abnormal   Collection Time: 02/05/21  7:55 PM  Result Value Ref Range   WBC 19.2 (H) 4.0 - 10.5 K/uL   RBC 4.82 3.87 - 5.11 MIL/uL   Hemoglobin 12.3 12.0 - 15.0 g/dL   HCT 41.4 36.0 - 46.0 %   MCV 85.9 80.0 - 100.0 fL   MCH 25.5 (L) 26.0 - 34.0 pg   MCHC 29.7 (L) 30.0 - 36.0 g/dL   RDW 18.3 (H) 11.5 - 15.5 %   Platelets 488 (H) 150 - 400 K/uL   nRBC 0.0 0.0 - 0.2 %   Neutrophils Relative % 83 %   Neutro Abs 15.9 (H) 1.7 - 7.7 K/uL   Lymphocytes Relative 11 %   Lymphs Abs 2.1 0.7 - 4.0 K/uL   Monocytes Relative 4 %   Monocytes Absolute 0.7 0.1 - 1.0 K/uL   Eosinophils Relative 1 %   Eosinophils Absolute 0.3 0.0 - 0.5 K/uL   Basophils Relative 0 %   Basophils Absolute 0.1 0.0 - 0.1 K/uL   Immature Granulocytes 1 %   Abs Immature Granulocytes 0.19 (H) 0.00 - 0.07 K/uL    Comment: Performed at [facility], [place] [address], [place], [place] [address]  Magnesium     Status: Abnormal   Collection Time: 02/05/21  7:55 PM  Result Value Ref Range   Magnesium 2.5 (H) 1.7 - 2.4 mg/dL    Comment: Performed at [facility], [place] [address], [place], [place] [address]  Resp Panel by RT-PCR (Flu A&B, Covid) Nasopharyngeal Swab     Status: None   Collection Time: 02/06/21  4:08 AM   Specimen: Nasopharyngeal Swab; Nasopharyngeal(NP) swabs in vial transport medium  Result Value Ref Range   SARS Coronavirus 2 by RT PCR NEGATIVE NEGATIVE    Comment: (NOTE) SARS-CoV-2 target nucleic acids are NOT DETECTED.  The SARS-CoV-2 RNA is generally detectable in upper respiratory specimens during the acute  phase of infection. The lowest concentration of SARS-CoV-2 viral copies this assay can detect is 138 copies/mL. A negative result does not preclude SARS-Cov-2 infection and should not be used as the sole basis for treatment or other patient management decisions. A negative result may occur with  improper specimen collection/handling, submission of specimen other than nasopharyngeal swab, presence of viral mutation(s) within the areas targeted by this assay, and inadequate number of viral copies(<138 copies/mL). A negative result must be combined with clinical observations, patient history, and epidemiological information. The expected result is Negative.  Fact Sheet for Patients:  EntrepreneurPulse.com.au  Fact Sheet for Healthcare Providers:  IncredibleEmployment.be  This test is no t yet approved or cleared by the [place] FDA and  has been authorized for detection and/or diagnosis of SARS-CoV-2 by FDA under an Emergency Use Authorization (EUA). This EUA will remain  in effect (meaning this test can be used) for the duration of the COVID-19 declaration under Section 564(b)(1) of the Act, 21 U.S.C.section 360bbb-3(b)(1), unless  the authorization is terminated  or revoked sooner.       Influenza A by PCR NEGATIVE NEGATIVE   Influenza B by PCR NEGATIVE NEGATIVE    Comment: (NOTE) The Xpert Xpress SARS-CoV-2/FLU/RSV plus assay is intended as an aid in the diagnosis of influenza from Nasopharyngeal swab specimens and should not be used as a sole basis for treatment. Nasal washings and aspirates are unacceptable for Xpert Xpress SARS-CoV-2/FLU/RSV testing.  Fact Sheet for Patients: EntrepreneurPulse.com.au  Fact Sheet for Healthcare Providers: IncredibleEmployment.be  This test is not yet approved or cleared by the [place] FDA and has been authorized for detection and/or diagnosis of SARS-CoV-2  by FDA under an Emergency Use Authorization (EUA). This EUA will remain in effect (meaning this test can be used) for the duration of the COVID-19 declaration under Section 564(b)(1) of the Act, 21 U.S.C. section 360bbb-3(b)(1), unless the authorization is terminated or revoked.  Performed at [facility], [place] [address], [place], [place] [address]     Current Facility-Administered Medications  Medication Dose Route Frequency Provider Last Rate Last Admin   albuterol (VENTOLIN HFA) 108 (90 Base) MCG/ACT inhaler 2 puff  2 puff Inhalation Q4H PRN Isla Pence, MD       buPROPion (WELLBUTRIN XL) 24 hr tablet 300 mg  300 mg Oral Daily Isla Pence, MD   300 mg at 02/06/21 4917   clonazepam (KLONOPIN) disintegrating tablet 0.25 mg  0.25 mg Oral Daily PRN Isla Pence, MD       fluticasone (FLONASE) 50 MCG/ACT nasal spray 2 spray  2 spray Each Nare BID PRN Isla Pence, MD       furosemide (LASIX) tablet 40 mg  40 mg Oral Daily Isla Pence, MD   40 mg at 02/06/21 9150   methocarbamol (ROBAXIN) tablet 500 mg  500 mg Oral QID Isla Pence, MD   500 mg at 02/06/21 1720   mometasone-formoterol (DULERA) 100-5 MCG/ACT inhaler 2 puff  2 puff Inhalation BID Isla Pence, MD   2 puff at 02/06/21 2115   pregabalin (LYRICA) capsule 100 mg  100 mg Oral TID Isla Pence, MD   100 mg at 02/06/21 1431   rOPINIRole (REQUIP) tablet 4 mg  4 mg Oral QHS Isla Pence, MD   4 mg at 02/06/21 0057   senna-docusate (Senokot-S) tablet 2 tablet  2 tablet Oral QHS PRN Isla Pence, MD       topiramate (TOPAMAX) tablet 25 mg  25 mg Oral BID Isla Pence, MD   25 mg at 02/06/21 0905   traZODone (DESYREL) tablet 50 mg  50 mg Oral QHS Isla Pence, MD   50 mg at 02/06/21 0100   ziprasidone (GEODON) capsule 20 mg  20 mg Oral Daily Isla Pence, MD   20 mg at 02/06/21 0906   Current Outpatient Medications  Medication Sig Dispense Refill   albuterol (VENTOLIN HFA) 108  (90 Base) MCG/ACT inhaler Inhale 2 puffs into the lungs every 4 (four) hours as needed for wheezing or shortness of breath. 8.5 g 1   ascorbic acid (VITAMIN C) 500 MG tablet Take 1 tablet (500 mg total) by mouth daily. 30 tablet 2   buPROPion (WELLBUTRIN XL) 300 MG 24 hr tablet Take 1 tablet (300 mg total) by mouth daily. 30 tablet 3   clonazePAM (KLONOPIN) 0.5 MG tablet Take 1/2 tablet (0.25 mg total) by mouth daily as needed (panic attacks). 30 tablet 0   clotrimazole (LOTRIMIN) 1 % cream Apply topically 2 (two)  times daily. 30 g 0   diclofenac Sodium (VOLTAREN) 1 % GEL Apply 4 g topically 4 (four) times daily. 100 g 2   fluticasone (FLONASE) 50 MCG/ACT nasal spray Place 2 sprays into both nostrils 2 (two) times daily. (Patient taking differently: Place 2 sprays into both nostrils 2 (two) times daily as needed for allergies or rhinitis.) 16 g 2   furosemide (LASIX) 40 MG tablet Take 1 tablet (40 mg total) by mouth daily. 30 tablet 3   methocarbamol (ROBAXIN) 500 MG tablet Take 1 tablet (500 mg total) by mouth 4 (four) times daily. 120 tablet 2   mometasone-formoterol (DULERA) 100-5 MCG/ACT AERO Inhale 2 puffs into the lungs 2 (two) times daily. 13 g 2   nystatin (MYCOSTATIN/NYSTOP) powder Apply topically 3 (three) times daily. 15 g 0   oxyCODONE (OXY IR/ROXICODONE) 5 MG immediate release tablet Take 1 tablet (5 mg total) by mouth every 4 (four) hours as needed for moderate pain. 30 tablet 0   potassium chloride SA (KLOR-CON) 20 MEQ tablet Take 1 tablet (20 mEq total) by mouth daily. 30 tablet 3   pregabalin (LYRICA) 100 MG capsule Take 1 capsule (100 mg total) by mouth 3 (three) times daily. 90 capsule 2   rOPINIRole (REQUIP) 4 MG tablet Take 1 tablet (4 mg total) by mouth at bedtime. 30 tablet 2   senna-docusate (SENOKOT-S) 8.6-50 MG tablet Take 2 tablets by mouth daily. 60 tablet 0   silver sulfADIAZINE (SILVADENE) 1 % cream Apply topically daily. (Patient not taking: Reported on 01/26/2021) 85 g  0   topiramate (TOPAMAX) 25 MG tablet Take 1 tablet (25 mg total) by mouth 2 (two) times daily. 60 tablet 2   traZODone (DESYREL) 50 MG tablet Take 1 tablet (50 mg total) by mouth at bedtime. 30 tablet 2   vitamin B-12 (CYANOCOBALAMIN) 1000 MCG tablet Take 1 tablet (1,000 mcg total) by mouth daily. 30 tablet 2   ziprasidone (GEODON) 20 MG capsule Take 1 capsule (20 mg total) by mouth daily. 30 capsule 3    Musculoskeletal: Strength & Muscle Tone: decreased Gait & Station: unable to stand Patient leans: N/A            Psychiatric Specialty Exam:  Presentation  General Appearance: Fairly Groomed  Eye Contact:Good  Speech:Normal Rate; Clear and Coherent  Speech Volume:Normal  Handedness:Right   Mood and Affect  Mood:Dysphoric; Depressed  Affect:Labile   Thought Process  Thought Processes:Coherent; Goal Directed  Descriptions of Associations:Intact  Orientation:Full (Time, Place and Person)  Thought Content:Illogical  History of Schizophrenia/Schizoaffective disorder:No  Duration of Psychotic Symptoms:No data recorded Hallucinations:No data recorded Ideas of Reference:None  Suicidal Thoughts:No data recorded Homicidal Thoughts:No data recorded  Sensorium  Memory:Immediate Good; Recent Good; Remote Good  Judgment:Fair  Insight:Fair   Executive Functions  Concentration:Good  Attention Span:Good  [place] of Knowledge:Good  Language:Good   Psychomotor Activity  Psychomotor Activity:No data recorded  Assets  Assets:Communication Skills   Sleep  Sleep:No data recorded  Physical Exam: Physical Exam Constitutional:      Appearance: She is obese.  Skin:    Capillary Refill: Capillary refill takes less than 2 seconds.  Neurological:     General: No focal deficit present.     Mental Status: She is alert and oriented to person, place, and time. Mental status is at baseline.  Psychiatric:        Attention and Perception:  Attention and perception normal.        Mood and  Affect: Mood normal. Affect is tearful.        Speech: Speech normal.        Behavior: Behavior normal. Behavior is cooperative.        Thought Content: Thought content normal.        Cognition and Memory: Cognition and memory normal.        Judgment: Judgment normal.   Review of Systems  Psychiatric/Behavioral:  Positive for depression. Negative for hallucinations, memory loss, substance abuse and suicidal ideas. The patient is not nervous/anxious and does not have insomnia.   All other systems reviewed and are negative. Blood pressure 106/74, pulse 75, temperature 98.4 F (36.9 C), temperature source Oral, resp. rate 20, height 5\' 3"  (1.6 m), weight (!) 194.4 kg, SpO2 97 %. Body mass index is 75.92 kg/m.  Treatment Plan Summary: Plan   Psych clear. Patient has outpatient resources in place, alst saw Dr. Reita Cliche via telepsych on 10/25, follow appointment has been made and scheduled. Patient remains in needs of placement and continues to be difficult to place both in and out of the home. Patient with extended length of stays and numerous ED visits, 2nd to housing and lack of resources. Our DTP has found it very difficult to place this patient, as she does not have income, decrease in availability of shelter beds and medically stable. At this time she continues to remain her for the sole purposes of housing and psychosocial issues. TOC consult placed, although it seems to be very limited, encourage all parties to seek LTAC or assisted living facility.   Disposition: No evidence of imminent risk to self or others at present.   Patient does not meet criteria for psychiatric inpatient admission. Supportive therapy provided about ongoing stressors. Refer to IOP. Discussed crisis plan, support from social network, calling 911, coming to the Emergency Department, and calling Suicide Hotline.  Suella Broad, FNP 02/07/2021 11:39 AM

## 2021-02-07 NOTE — Discharge Instructions (Addendum)
For your behavioral health needs you are advised to continue treatment with [facility] at your earliest opportunity.  Your next appointment with Waylan Boga, NP is scheduled for Monday, April 23, 2021 at 10:30 am.  This appointment will be virtual:       [facility]      [place], [place] [address]      [phone]

## 2021-02-07 NOTE — ED Notes (Signed)
Informed pt she should not have cell phone and I would be taking it at 6 and putting it in the cabinet.

## 2021-02-07 NOTE — Discharge Instructions (Addendum)
Keep scheduled appointment with Waylan Boga, NP for medication management.  Follow up with primary or go to medical urgent care, ED for urinary tract infection

## 2021-02-08 ENCOUNTER — Telehealth (HOSPITAL_COMMUNITY): Payer: Self-pay | Admitting: Family Medicine

## 2021-02-08 DIAGNOSIS — R0789 Other chest pain: Secondary | ICD-10-CM | POA: Diagnosis not present

## 2021-02-08 LAB — BASIC METABOLIC PANEL
Anion gap: 11 (ref 5–15)
BUN: 23 mg/dL — ABNORMAL HIGH (ref 6–20)
CO2: 21 mmol/L — ABNORMAL LOW (ref 22–32)
Calcium: 9.7 mg/dL (ref 8.9–10.3)
Chloride: 101 mmol/L (ref 98–111)
Creatinine, Ser: 1.38 mg/dL — ABNORMAL HIGH (ref 0.44–1.00)
GFR, Estimated: 49 mL/min — ABNORMAL LOW (ref >60)
Glucose, Bld: 97 mg/dL (ref 70–99)
Potassium: 3.6 mmol/L (ref 3.5–5.1)
Sodium: 133 mmol/L — ABNORMAL LOW (ref 135–145)

## 2021-02-08 LAB — URINALYSIS, ROUTINE W REFLEX MICROSCOPIC
Bilirubin Urine: NEGATIVE
Glucose, UA: NEGATIVE mg/dL
Ketones, ur: NEGATIVE mg/dL
Nitrite: POSITIVE — AB
Protein, ur: 30 mg/dL — AB
RBC / HPF: >50 RBC/hpf — ABNORMAL HIGH (ref 0–5)
Specific Gravity, Urine: 1.013 (ref 1.005–1.030)
WBC, UA: >50 WBC/hpf — ABNORMAL HIGH (ref 0–5)
pH: 5 (ref 5.0–8.0)

## 2021-02-08 LAB — URINE CULTURE

## 2021-02-08 LAB — TROPONIN I (HIGH SENSITIVITY)
Troponin I (High Sensitivity): 6 ng/L (ref <18)
Troponin I (High Sensitivity): 9 ng/L (ref <18)

## 2021-02-08 LAB — RESP PANEL BY RT-PCR (FLU A&B, COVID) ARPGX2
Influenza A by PCR: NEGATIVE
Influenza B by PCR: NEGATIVE
SARS Coronavirus 2 by RT PCR: NEGATIVE

## 2021-02-08 LAB — I-STAT BETA HCG BLOOD, ED (MC, WL, AP ONLY): I-stat hCG, quantitative: <5 m[IU]/mL (ref <5)

## 2021-02-08 MED ORDER — CEPHALEXIN 500 MG PO CAPS: 500.0000 mg | ORAL_CAPSULE | Freq: Four times a day (QID) | ORAL | 0 refills | Status: DC

## 2021-02-08 MED ORDER — SODIUM CHLORIDE 0.9 % IV BOLUS
500.0000 mL | Freq: Once | INTRAVENOUS | Status: AC
  Administered 2021-02-08: 500 mL via INTRAVENOUS

## 2021-02-08 MED ORDER — SODIUM CHLORIDE 0.9 % IV SOLN
1.0000 g | Freq: Once | INTRAVENOUS | Status: AC
  Administered 2021-02-08: 1 g via INTRAVENOUS
  Filled 2021-02-08: qty 10

## 2021-02-08 MED ORDER — ONDANSETRON 4 MG PO TBDP: 4.0000 mg | ORAL_TABLET | Freq: Three times a day (TID) | ORAL | 0 refills | Status: DC | PRN

## 2021-02-08 NOTE — ED Notes (Signed)
RN reviewed discharge instructions with pt. Pt verbalized understanding and had no further questions. VSS upon discharge.  

## 2021-02-08 NOTE — ED Provider Notes (Addendum)
[facility] EMERGENCY DEPARTMENT Provider Note   CSN: [id number] Arrival date & time: 02/07/21  2038     History Chief Complaint  Patient presents with   Chest Pain    Heather [name] is a 45 y.o. female.  The history is provided by the patient and medical records.  Chest Pain Heather [name] is a 45 y.o. female who presents to the Emergency Department complaining of chest pain. She presents the emergency department complaining of chest tightness that started today. Pain is located across her chest. She has associated cough productive of green sputum. Symptoms started today. She has been experiencing intermittent right-sided chest pain for about six months. She has mild shortness of breath. She has subjective fevers. She also is concerned about urinary tract infection because she has a odor "down there".    Past Medical History:  Diagnosis Date   Anxiety    Arthritis    Asthma    Back pain    Constipation    COPD (chronic obstructive pulmonary disease) ([place])    Depression    Diabetes mellitus without complication ([place])    patient denies but states she has hyperglycemia-diet controlled   Dysrhythmia    DR Johnsie Cancel     Ectopic pregnancy 8768   Eosinophilic esophagitis    Diagnosed at [facility] 06/16/2013, untreated   GERD (gastroesophageal reflux disease)    HEARTBURN   TUMS   High cholesterol    IBS (irritable bowel syndrome)    Leukocytosis 07/28/2008   Qualifier: Diagnosis of  By: Jonna Munro MD, Cornelius     Morbid obesity [facility])    Neuromuscular disorder ([place])    RESTLESS LEG    Obesity    Schizoaffective disorder, bipolar type ([place])    Sleep apnea    CPAP- in process of restarting     Patient Active Problem List   Diagnosis Date Noted   Homelessness 02/07/2021   Cystitis 01/26/2021   Social problem 01/26/2021   Enterococcus faecalis infection 01/26/2021   Mood disorder ([place]) 01/26/2021   Chronic pain disorder 01/26/2021   Thrombocytosis  10/31/2020   CKD (chronic kidney disease), stage III ([place]) 10/31/2020   Major depressive disorder, recurrent ([place]) 09/13/2020   Insomnia    Anemia of chronic disease    Chronic bilateral low back pain without sciatica    Debility 08/24/2020   SIRS (systemic inflammatory response syndrome) ([place]) 08/18/2020   Pressure injury of skin 08/11/2020   Rhabdomyolysis 08/10/2020   Acute renal failure ([place]) 08/10/2020   PTSD (post-traumatic stress disorder) 05/15/2020   Major depressive disorder, recurrent episode, moderate ([place]) 05/05/2020   Generalized anxiety disorder 05/05/2020   Blood in stool    Gastritis and gastroduodenitis    Benign neoplasm of sigmoid colon    Class 3 severe obesity with serious comorbidity and body mass index (BMI) greater than or equal to 70 in adult ([place]) 06/16/2018   Nexplanon insertion 03/27/2017   Postpartum hypertension 03/05/2017   Perennial allergic rhinitis 11/05/2016   Mild persistent asthma with acute exacerbation 11/05/2016   Hypoglycemia 11/07/2015   OSA (obstructive sleep apnea) 11/07/2015   DM type 2 (diabetes mellitus, type 2) ([place]) 12/07/2014   Panniculitis 12/06/2014   Cellulitis, abdominal wall 11/11/2014   Abdominal pain 11/57/2620   Eosinophilic esophagitis 35/59/7416   Change in bowel habits 04/28/2013   RLS (restless legs syndrome) 08/08/2011   Adjustment disorder 38/45/3646   DYSMETABOLIC SYNDROME 80/32/1224   Essential hypertension 05/12/2006   Asthma 05/12/2006  OSTEOARTHRITIS 05/12/2006    Past Surgical History:  Procedure Laterality Date   BIOPSY  08/13/2018   Procedure: BIOPSY;  Surgeon: Yetta Flock, MD;  Location: [facility];  Service: Gastroenterology;;   CESAREAN SECTION MULTI-GESTATIONAL N/A 02/03/2017   Procedure: CESAREAN SECTION MULTI-GESTATIONAL;  Surgeon: Jonnie Kind, MD;  Location: [facility];  Service: Obstetrics;  Laterality: N/A;   CHOLECYSTECTOMY     COLONOSCOPY WITH PROPOFOL N/A 08/13/2018    Procedure: COLONOSCOPY WITH PROPOFOL;  Surgeon: Yetta Flock, MD;  Location: [facility];  Service: Gastroenterology;  Laterality: N/A;   DENTAL SURGERY     ESOPHAGOGASTRODUODENOSCOPY  May 2007   Dr. Gala Romney: Normal esophagus, stomach, D1, D2   ESOPHAGOGASTRODUODENOSCOPY  06/16/2013   Dr. Carlton Adam, eosinophilic esophagitis, reactive gastropathy, no esophageal dilation   ESOPHAGOGASTRODUODENOSCOPY (EGD) WITH PROPOFOL N/A 08/13/2018   Procedure: ESOPHAGOGASTRODUODENOSCOPY (EGD) WITH PROPOFOL;  Surgeon: Yetta Flock, MD;  Location: [facility];  Service: Gastroenterology;  Laterality: N/A;   POLYPECTOMY  08/13/2018   Procedure: POLYPECTOMY;  Surgeon: Yetta Flock, MD;  Location: [facility];  Service: Gastroenterology;;   TONSILLECTOMY     TOOTH EXTRACTION  10/28/2011   Procedure: DENTAL RESTORATION/EXTRACTIONS;  Surgeon: Gae Bon, DDS;  Location: [facility] OR;  Service: Oral Surgery;;   UPPER GASTROINTESTINAL ENDOSCOPY       OB History     Gravida  2   Para  1   Term  1   Preterm      AB  1   Living  2      SAB  0   IAB      Ectopic  1   Multiple  1   Live Births  2           Family History  Problem Relation Age of Onset   Depression Mother    Anxiety disorder Mother    High blood pressure Mother    Bipolar disorder Mother    Eating disorder Mother    Obesity Mother    Hypertension Sister    Allergic rhinitis Sister    Colon polyps Maternal Grandmother        24s   Diabetes Maternal Grandmother    Anxiety disorder Maternal Grandmother    COPD Maternal Grandmother    Crohn's disease Maternal Aunt    Cancer Maternal Grandfather        prostate   HIV/AIDS Father    Eating disorder Father    Obesity Father    Liver disease Neg Hx    Angioedema Neg Hx    Eczema Neg Hx    Immunodeficiency Neg Hx    Asthma Neg Hx    Urticaria Neg Hx    Colon cancer Neg Hx    Esophageal cancer Neg Hx    Rectal cancer Neg Hx    Stomach cancer Neg  Hx     Social History   Tobacco Use   Smoking status: Former    Packs/day: 0.50    Years: 8.00    Pack years: 4.00    Types: Cigarettes    Quit date: 04/25/2011    Years since quitting: 9.8   Smokeless tobacco: Never  Vaping Use   Vaping Use: Never used  Substance Use Topics   Alcohol use: Not Currently    Comment: occasional "relapse" of a wine cooler   Drug use: Not Currently    Types: Marijuana    Home Medications Prior to Admission medications   Medication Sig  Start Date End Date Taking? Authorizing Provider  cephALEXin (KEFLEX) 500 MG capsule Take 1 capsule (500 mg total) by mouth 4 (four) times daily. 02/08/21  Yes Quintella Reichert, MD  ondansetron (ZOFRAN ODT) 4 MG disintegrating tablet Take 1 tablet (4 mg total) by mouth every 8 (eight) hours as needed for nausea or vomiting. 02/08/21  Yes Quintella Reichert, MD  albuterol (VENTOLIN HFA) 108 [phone] Base) MCG/ACT inhaler Inhale 2 puffs into the lungs every 4 (four) hours as needed for wheezing or shortness of breath. 12/27/20   Samella Parr, NP  ascorbic acid (VITAMIN C) 500 MG tablet Take 1 tablet (500 mg total) by mouth daily. 12/27/20   Samella Parr, NP  buPROPion (WELLBUTRIN XL) 300 MG 24 hr tablet Take 1 tablet (300 mg total) by mouth daily. 01/29/21   Patrecia Pour, NP  clonazePAM (KLONOPIN) 0.5 MG tablet Take 1/2 tablet (0.25 mg total) by mouth daily as needed (panic attacks). 01/04/21   Samella Parr, NP  clotrimazole (LOTRIMIN) 1 % cream Apply topically 2 (two) times daily. 11/02/20   Shelly Coss, MD  diclofenac Sodium (VOLTAREN) 1 % GEL Apply 4 g topically 4 (four) times daily. 12/27/20   Samella Parr, NP  fluticasone (FLONASE) 50 MCG/ACT nasal spray Place 2 sprays into both nostrils 2 (two) times daily. Patient taking differently: Place 2 sprays into both nostrils 2 (two) times daily as needed for allergies or rhinitis. 12/27/20   Samella Parr, NP  furosemide (LASIX) 40 MG tablet Take 1 tablet (40 mg  total) by mouth daily. 01/04/21   Samella Parr, NP  methocarbamol (ROBAXIN) 500 MG tablet Take 1 tablet (500 mg total) by mouth 4 (four) times daily. 12/27/20   Samella Parr, NP  mometasone-formoterol (DULERA) 100-5 MCG/ACT AERO Inhale 2 puffs into the lungs 2 (two) times daily. 12/27/20   Samella Parr, NP  nystatin (MYCOSTATIN/NYSTOP) powder Apply topically 3 (three) times daily. 12/27/20   Samella Parr, NP  oxyCODONE (OXY IR/ROXICODONE) 5 MG immediate release tablet Take 1 tablet (5 mg total) by mouth every 4 (four) hours as needed for moderate pain. 12/27/20   Samella Parr, NP  potassium chloride SA (KLOR-CON) 20 MEQ tablet Take 1 tablet (20 mEq total) by mouth daily. 01/04/21   Samella Parr, NP  pregabalin (LYRICA) 100 MG capsule Take 1 capsule (100 mg total) by mouth 3 (three) times daily. 12/27/20   Samella Parr, NP  rOPINIRole (REQUIP) 4 MG tablet Take 1 tablet (4 mg total) by mouth at bedtime. 12/27/20   Samella Parr, NP  senna-docusate (SENOKOT-S) 8.6-50 MG tablet Take 2 tablets by mouth daily. 12/27/20   Samella Parr, NP  silver sulfADIAZINE (SILVADENE) 1 % cream Apply topically daily. Patient not taking: Reported on 01/26/2021 01/04/21   Samella Parr, NP  topiramate (TOPAMAX) 25 MG tablet Take 1 tablet (25 mg total) by mouth 2 (two) times daily. 12/27/20   Samella Parr, NP  traZODone (DESYREL) 50 MG tablet Take 1 tablet (50 mg total) by mouth at bedtime. 01/29/21   Patrecia Pour, NP  vitamin B-12 (CYANOCOBALAMIN) 1000 MCG tablet Take 1 tablet (1,000 mcg total) by mouth daily. 12/27/20   Samella Parr, NP  ziprasidone (GEODON) 20 MG capsule Take 1 capsule (20 mg total) by mouth daily. 01/29/21   Patrecia Pour, NP    Allergies    Amoxicillin, Bee venom, Penicillin g, Penicillins, Adhesive [tape], Latex, and  Vancomycin  Review of Systems   Review of Systems  Cardiovascular:  Positive for chest pain.  All other systems reviewed and are  negative.  Physical Exam Updated Vital Signs BP 131/80   Pulse 79   Temp 98.8 F (37.1 C) (Oral)   Resp 12   SpO2 99%   Physical Exam Vitals and nursing note reviewed.  Constitutional:      Appearance: She is well-developed.  HENT:     Head: Normocephalic and atraumatic.  Cardiovascular:     Rate and Rhythm: Normal rate and regular rhythm.     Heart sounds: No murmur heard. Pulmonary:     Effort: Pulmonary effort is normal. No respiratory distress.     Breath sounds: Normal breath sounds.  Abdominal:     Palpations: Abdomen is soft.     Tenderness: There is no abdominal tenderness. There is no guarding or rebound.  Genitourinary:    Comments: Limited evaluation due to habitus.  No overt lesions or discharge.  Musculoskeletal:        General: No swelling or tenderness.  Skin:    General: Skin is warm and dry.  Neurological:     Mental Status: She is alert and oriented to person, place, and time.  Psychiatric:        Behavior: Behavior normal.    ED Results / Procedures / Treatments   Labs (all labs ordered are listed, but only abnormal results are displayed) Labs Reviewed  CBC WITH DIFFERENTIAL/PLATELET - Abnormal; Notable for the following components:      Result Value   WBC 17.0 (*)    MCH 25.5 (*)    RDW 17.7 (*)    Platelets 519 (*)    Neutro Abs 12.0 (*)    Abs Immature Granulocytes 0.34 (*)    All other components within normal limits  BASIC METABOLIC PANEL - Abnormal; Notable for the following components:   Sodium 133 (*)    CO2 21 (*)    BUN 23 (*)    Creatinine, Ser 1.38 (*)    GFR, Estimated 49 (*)    All other components within normal limits  URINALYSIS, ROUTINE W REFLEX MICROSCOPIC - Abnormal; Notable for the following components:   Color, Urine AMBER (*)    APPearance CLOUDY (*)    Hgb urine dipstick MODERATE (*)    Protein, ur 30 (*)    Nitrite POSITIVE (*)    Leukocytes,Ua LARGE (*)    RBC / HPF >50 (*)    WBC, UA >50 (*)    Bacteria,  UA FEW (*)    All other components within normal limits  RESP PANEL BY RT-PCR (FLU A&B, COVID) ARPGX2  URINE CULTURE  I-STAT BETA HCG BLOOD, ED ([facility], [facility], [facility] ONLY)  TROPONIN I (HIGH SENSITIVITY)  TROPONIN I (HIGH SENSITIVITY)    EKG EKG Interpretation  Date/Time:  Wednesday February 07 2021 20:58:15 EDT Ventricular Rate:  99 PR Interval:  148 QRS Duration: 142 QT Interval:  386 QTC Calculation: 495 R Axis:   8 Text Interpretation: Normal sinus rhythm Right bundle branch block Minimal voltage criteria for LVH, may be normal variant ( R in aVL ) Abnormal ECG RBBB present on prior tracing 01/25/21 Confirmed by Quintella Reichert [phone]) on 02/08/2021 12:20:28 AM  Radiology DG Chest 2 View  Result Date: 02/08/2021 CLINICAL DATA:  Chest pain and cough. EXAM: CHEST - 2 VIEW COMPARISON:  January 25, 2021 FINDINGS: The heart size and mediastinal contours are within normal limits.  Both lungs are clear. A small radiopaque foreign body is seen overlying the superior mediastinum, just below the level of the clavicles on the frontal view. This may be consistent with a pendant noted within this region on the lateral image. The visualized skeletal structures are unremarkable. IMPRESSION: No active cardiopulmonary disease. Electronically Signed   By: Virgina Norfolk M.D.   On: 02/08/2021 00:23    Procedures Procedures   Medications Ordered in ED Medications  cefTRIAXone (ROCEPHIN) 1 g in sodium chloride 0.9 % 100 mL IVPB (has no administration in time range)  sodium chloride 0.9 % bolus 500 mL (has no administration in time range)    ED Course  I have reviewed the triage vital signs and the nursing notes.  Pertinent labs & imaging results that were available during my care of the patient were reviewed by me and considered in my medical decision making (see chart for details).    MDM Rules/Calculators/A&P                          patient with history of recurrent UTI here for evaluation of  chest pain. EKG is without acute ischemic changes. Troponin is negative. Presentation is not consistent with ACS, PE, dissection. She also reports GU foul odor. UA is concerning for UTI. Will treat with antibiotics in the setting of her symptoms. CBC with leukocytosis. She does have a history of recurrent leukocytosis. No current evidence of sepsis. Records reviewed in epic. Patient did have a recent CT scan that was negative for obstructing stone. Do not feel additional imaging is necessary at this point. Plan to d/c with oral meds, outpatient follow up and return precautions.   Final Clinical Impression(s) / ED Diagnoses Final diagnoses:  Atypical chest pain  Acute UTI    Rx / DC Orders ED Discharge Orders          Ordered    cephALEXin (KEFLEX) 500 MG capsule  4 times daily        02/08/21 0356    ondansetron (ZOFRAN ODT) 4 MG disintegrating tablet  Every 8 hours PRN        02/08/21 0356             Quintella Reichert, MD 02/08/21 8003    Quintella Reichert, MD 02/08/21 [phone]

## 2021-02-08 NOTE — BH Assessment (Signed)
Care Management - Follow Up [facility] Discharges   Writer attempted to make contact with patient today and was unsuccessful.  Writer left a HIPPA compliant voice message.   Per chart review, patient has a follow up appt with Waylan Boga, NP on 04-23-21

## 2021-02-15 ENCOUNTER — Encounter (HOSPITAL_COMMUNITY): Payer: Self-pay | Admitting: Emergency Medicine

## 2021-02-15 ENCOUNTER — Other Ambulatory Visit: Payer: Self-pay

## 2021-02-15 ENCOUNTER — Encounter (HOSPITAL_BASED_OUTPATIENT_CLINIC_OR_DEPARTMENT_OTHER): Payer: Self-pay | Admitting: Pharmacist

## 2021-02-15 ENCOUNTER — Emergency Department (HOSPITAL_COMMUNITY)
Admission: EM | Admit: 2021-02-15 | Discharge: 2021-02-16 | Disposition: A | Discharge status: Home / Self Care | Payer: 59 | Attending: Emergency Medicine | Admitting: Emergency Medicine

## 2021-02-15 ENCOUNTER — Other Ambulatory Visit (HOSPITAL_BASED_OUTPATIENT_CLINIC_OR_DEPARTMENT_OTHER): Payer: Self-pay

## 2021-02-15 ENCOUNTER — Ambulatory Visit (HOSPITAL_COMMUNITY)
Admission: RE | Admit: 2021-02-15 | Discharge: 2021-02-15 | Disposition: A | Discharge status: Home / Self Care | Payer: 59 | Source: Home / Self Care | Attending: Psychiatry | Admitting: Psychiatry

## 2021-02-15 ENCOUNTER — Other Ambulatory Visit: Payer: Self-pay | Admitting: Physician Assistant

## 2021-02-15 DIAGNOSIS — Z5900 Homelessness unspecified: Secondary | ICD-10-CM | POA: Insufficient documentation

## 2021-02-15 DIAGNOSIS — Z609 Problem related to social environment, unspecified: Secondary | ICD-10-CM | POA: Diagnosis not present

## 2021-02-15 DIAGNOSIS — F1090 Alcohol use, unspecified, uncomplicated: Secondary | ICD-10-CM | POA: Insufficient documentation

## 2021-02-15 DIAGNOSIS — F419 Anxiety disorder, unspecified: Secondary | ICD-10-CM | POA: Insufficient documentation

## 2021-02-15 DIAGNOSIS — J45909 Unspecified asthma, uncomplicated: Secondary | ICD-10-CM | POA: Insufficient documentation

## 2021-02-15 DIAGNOSIS — F32A Depression, unspecified: Secondary | ICD-10-CM | POA: Insufficient documentation

## 2021-02-15 DIAGNOSIS — F1110 Opioid abuse, uncomplicated: Secondary | ICD-10-CM | POA: Insufficient documentation

## 2021-02-15 DIAGNOSIS — Z560 Unemployment, unspecified: Secondary | ICD-10-CM | POA: Insufficient documentation

## 2021-02-15 DIAGNOSIS — S8992XA Unspecified injury of left lower leg, initial encounter: Secondary | ICD-10-CM | POA: Diagnosis present

## 2021-02-15 DIAGNOSIS — F4320 Adjustment disorder, unspecified: Secondary | ICD-10-CM | POA: Insufficient documentation

## 2021-02-15 DIAGNOSIS — R45851 Suicidal ideations: Secondary | ICD-10-CM | POA: Insufficient documentation

## 2021-02-15 DIAGNOSIS — F410 Panic disorder [episodic paroxysmal anxiety] without agoraphobia: Secondary | ICD-10-CM | POA: Insufficient documentation

## 2021-02-15 DIAGNOSIS — F4310 Post-traumatic stress disorder, unspecified: Secondary | ICD-10-CM | POA: Insufficient documentation

## 2021-02-15 DIAGNOSIS — R270 Ataxia, unspecified: Secondary | ICD-10-CM | POA: Insufficient documentation

## 2021-02-15 DIAGNOSIS — S8392XA Sprain of unspecified site of left knee, initial encounter: Secondary | ICD-10-CM

## 2021-02-15 DIAGNOSIS — Z635 Disruption of family by separation and divorce: Secondary | ICD-10-CM | POA: Insufficient documentation

## 2021-02-15 DIAGNOSIS — R441 Visual hallucinations: Secondary | ICD-10-CM | POA: Insufficient documentation

## 2021-02-15 DIAGNOSIS — Z9151 Personal history of suicidal behavior: Secondary | ICD-10-CM | POA: Insufficient documentation

## 2021-02-15 DIAGNOSIS — N9489 Other specified conditions associated with female genital organs and menstrual cycle: Secondary | ICD-10-CM | POA: Diagnosis not present

## 2021-02-15 DIAGNOSIS — Z638 Other specified problems related to primary support group: Secondary | ICD-10-CM | POA: Insufficient documentation

## 2021-02-15 DIAGNOSIS — E119 Type 2 diabetes mellitus without complications: Secondary | ICD-10-CM | POA: Insufficient documentation

## 2021-02-15 DIAGNOSIS — G4700 Insomnia, unspecified: Secondary | ICD-10-CM | POA: Insufficient documentation

## 2021-02-15 DIAGNOSIS — Z20822 Contact with and (suspected) exposure to covid-19: Secondary | ICD-10-CM | POA: Insufficient documentation

## 2021-02-15 DIAGNOSIS — Z91040 Latex allergy status: Secondary | ICD-10-CM | POA: Insufficient documentation

## 2021-02-15 DIAGNOSIS — F331 Major depressive disorder, recurrent, moderate: Secondary | ICD-10-CM | POA: Diagnosis not present

## 2021-02-15 DIAGNOSIS — F333 Major depressive disorder, recurrent, severe with psychotic symptoms: Secondary | ICD-10-CM | POA: Insufficient documentation

## 2021-02-15 DIAGNOSIS — N189 Chronic kidney disease, unspecified: Secondary | ICD-10-CM | POA: Insufficient documentation

## 2021-02-15 DIAGNOSIS — J449 Chronic obstructive pulmonary disease, unspecified: Secondary | ICD-10-CM | POA: Insufficient documentation

## 2021-02-15 DIAGNOSIS — Z659 Problem related to unspecified psychosocial circumstances: Secondary | ICD-10-CM

## 2021-02-15 DIAGNOSIS — W19XXXA Unspecified fall, initial encounter: Secondary | ICD-10-CM | POA: Insufficient documentation

## 2021-02-15 DIAGNOSIS — Z87891 Personal history of nicotine dependence: Secondary | ICD-10-CM | POA: Diagnosis not present

## 2021-02-15 DIAGNOSIS — R4589 Other symptoms and signs involving emotional state: Secondary | ICD-10-CM

## 2021-02-15 MED ORDER — DOXYCYCLINE HYCLATE 50 MG PO CAPS
ORAL_CAPSULE | Freq: Every day | ORAL | 2 refills | Status: DC
  Filled 2021-02-15: qty 30, 30d supply, fill #0

## 2021-02-15 NOTE — ED Provider Notes (Signed)
[facility] DEPT Provider Note: Georgena Spurling, MD, FACEP  CSN: [id number] MRN: [id number] ARRIVAL: 02/15/21 at 2309 ROOM: WA09/WA09   CHIEF COMPLAINT  Psychiatric Evaluation   HISTORY OF PRESENT ILLNESS  02/15/21 11:52 PM Heather [name] is a 45 y.o. female bariatric patient who was an inpatient at [facility] regional from 02/10/2021 until this afternoon about 3 PM.  She was being treated for a urinary tract infection as well as shortness of breath and chronic immobility problems.  She has been homeless for several months and has poor social support.  At time of discharge she was transferred to [facility] for depression and suicidal ideation without specific plan.  She was excepted for admission but they did not have space for her overnight and sent her here for boarding pending reevaluation tomorrow morning.  The patient is having back and leg pain which is chronic but is also having worse left knee pain due to a fall earlier this week in a shelter.  Her pain is moderate and worse with weightbearing.   Past Medical History:  Diagnosis Date   Anxiety    Arthritis    Asthma    Back pain    Constipation    COPD (chronic obstructive pulmonary disease) ([place])    Depression    Diabetes mellitus without complication ([place])    patient denies but states she has hyperglycemia-diet controlled   Dysrhythmia    DR [facility]     Ectopic pregnancy 4270   Eosinophilic esophagitis    Diagnosed at [facility] 06/16/2013, untreated   GERD (gastroesophageal reflux disease)    HEARTBURN   TUMS   High cholesterol    IBS (irritable bowel syndrome)    Leukocytosis 07/28/2008   Qualifier: Diagnosis of  By: Jonna Munro MD, Cornelius     Morbid obesity [facility])    Neuromuscular disorder ([place])    RESTLESS LEG    Obesity    Schizoaffective disorder, bipolar type ([place])    Sleep apnea    CPAP- in process of restarting     Past Surgical History:  Procedure Laterality Date   BIOPSY  08/13/2018    Procedure: BIOPSY;  Surgeon: Yetta Flock, MD;  Location: [facility];  Service: Gastroenterology;;   CESAREAN SECTION MULTI-GESTATIONAL N/A 02/03/2017   Procedure: [place];  Surgeon: Jonnie Kind, MD;  Location: [place];  Service: Obstetrics;  Laterality: N/A;   CHOLECYSTECTOMY     COLONOSCOPY WITH PROPOFOL N/A 08/13/2018   Procedure: COLONOSCOPY WITH PROPOFOL;  Surgeon: Yetta Flock, MD;  Location: [facility];  Service: Gastroenterology;  Laterality: N/A;   DENTAL SURGERY     ESOPHAGOGASTRODUODENOSCOPY  May 2007   Dr. Gala Romney: Normal esophagus, stomach, D1, D2   ESOPHAGOGASTRODUODENOSCOPY  06/16/2013   Dr. Carlton Adam, eosinophilic esophagitis, reactive gastropathy, no esophageal dilation   ESOPHAGOGASTRODUODENOSCOPY (EGD) WITH PROPOFOL N/A 08/13/2018   Procedure: ESOPHAGOGASTRODUODENOSCOPY (EGD) WITH PROPOFOL;  Surgeon: Yetta Flock, MD;  Location: [facility];  Service: Gastroenterology;  Laterality: N/A;   POLYPECTOMY  08/13/2018   Procedure: POLYPECTOMY;  Surgeon: Yetta Flock, MD;  Location: [facility];  Service: Gastroenterology;;   TONSILLECTOMY     TOOTH EXTRACTION  10/28/2011   Procedure: DENTAL RESTORATION/EXTRACTIONS;  Surgeon: Gae Bon, DDS;  Location: [facility] OR;  Service: Oral Surgery;;   UPPER GASTROINTESTINAL ENDOSCOPY      Family History  Problem Relation Age of Onset   Depression Mother    Anxiety disorder Mother    High  blood pressure Mother    Bipolar disorder Mother    Eating disorder Mother    Obesity Mother    Hypertension Sister    Allergic rhinitis Sister    Colon polyps Maternal Grandmother        33s   Diabetes Maternal Grandmother    Anxiety disorder Maternal Grandmother    COPD Maternal Grandmother    Crohn's disease Maternal Aunt    Cancer Maternal Grandfather        prostate   HIV/AIDS Father    Eating disorder Father    Obesity Father    Liver disease Neg Hx    Angioedema  Neg Hx    Eczema Neg Hx    Immunodeficiency Neg Hx    Asthma Neg Hx    Urticaria Neg Hx    Colon cancer Neg Hx    Esophageal cancer Neg Hx    Rectal cancer Neg Hx    Stomach cancer Neg Hx     Social History   Tobacco Use   Smoking status: Former    Packs/day: 0.50    Years: 8.00    Pack years: 4.00    Types: Cigarettes    Quit date: 04/25/2011    Years since quitting: 9.8   Smokeless tobacco: Never  Vaping Use   Vaping Use: Never used  Substance Use Topics   Alcohol use: Not Currently    Comment: occasional "relapse" of a wine cooler   Drug use: Not Currently    Types: Marijuana    Prior to Admission medications   Medication Sig Start Date End Date Taking? Authorizing Provider  ascorbic acid (VITAMIN C) 500 MG tablet Take 1 tablet (500 mg total) by mouth daily. 12/27/20  Yes Samella Parr, NP  buPROPion (WELLBUTRIN XL) 300 MG 24 hr tablet Take 1 tablet (300 mg total) by mouth daily. 01/29/21  Yes Patrecia Pour, NP  clonazePAM (KLONOPIN) 0.5 MG tablet Take 1/2 tablet (0.25 mg total) by mouth daily as needed (panic attacks). 01/04/21  Yes Samella Parr, NP  clotrimazole (LOTRIMIN) 1 % cream Apply topically 2 (two) times daily. 11/02/20  Yes Shelly Coss, MD  diclofenac Sodium (VOLTAREN) 1 % GEL Apply 4 g topically 4 (four) times daily. 12/27/20  Yes Samella Parr, NP  fluticasone (FLONASE) 50 MCG/ACT nasal spray Place 2 sprays into both nostrils 2 (two) times daily. Patient taking differently: Place 2 sprays into both nostrils 2 (two) times daily as needed for allergies or rhinitis. 12/27/20  Yes Samella Parr, NP  methocarbamol (ROBAXIN) 500 MG tablet Take 1 tablet (500 mg total) by mouth 4 (four) times daily. 12/27/20  Yes Samella Parr, NP  mometasone-formoterol (DULERA) 100-5 MCG/ACT AERO Inhale 2 puffs into the lungs 2 (two) times daily. 12/27/20  Yes Samella Parr, NP  ondansetron (ZOFRAN ODT) 4 MG disintegrating tablet Take 1 tablet (4 mg total) by mouth  every 8 (eight) hours as needed for nausea or vomiting. 02/08/21  Yes Quintella Reichert, MD  potassium chloride SA (KLOR-CON) 20 MEQ tablet Take 1 tablet (20 mEq total) by mouth daily. 01/04/21  Yes Samella Parr, NP  pregabalin (LYRICA) 100 MG capsule Take 1 capsule (100 mg total) by mouth 3 (three) times daily. 12/27/20  Yes Samella Parr, NP  rOPINIRole (REQUIP) 4 MG tablet Take 1 tablet (4 mg total) by mouth at bedtime. 12/27/20  Yes Samella Parr, NP  senna-docusate (SENOKOT-S) 8.6-50 MG tablet Take 2 tablets by mouth  daily. 12/27/20  Yes Samella Parr, NP  topiramate (TOPAMAX) 25 MG tablet Take 1 tablet (25 mg total) by mouth 2 (two) times daily. 12/27/20  Yes Samella Parr, NP  vitamin B-12 (CYANOCOBALAMIN) 1000 MCG tablet Take 1 tablet (1,000 mcg total) by mouth daily. 12/27/20  Yes Samella Parr, NP  ziprasidone (GEODON) 20 MG capsule Take 1 capsule (20 mg total) by mouth daily. 01/29/21  Yes Lord, Asa Saunas, NP  albuterol (VENTOLIN HFA) 108 (90 Base) MCG/ACT inhaler Inhale 2 puffs into the lungs every 4 (four) hours as needed for wheezing or shortness of breath. Patient not taking: Reported on 02/16/2021 12/27/20   Samella Parr, NP  cephALEXin (KEFLEX) 500 MG capsule Take 1 capsule (500 mg total) by mouth 4 (four) times daily. 02/08/21   Quintella Reichert, MD  doxycycline (VIBRAMYCIN) 50 MG capsule Take 1 tablet (50 mg total) by mouth at bedtime. 02/15/21     furosemide (LASIX) 40 MG tablet Take 1 tablet (40 mg total) by mouth daily. 01/04/21   Samella Parr, NP  nystatin (MYCOSTATIN/NYSTOP) powder Apply topically 3 (three) times daily. Patient not taking: Reported on 02/16/2021 12/27/20   Samella Parr, NP  oxyCODONE (OXY IR/ROXICODONE) 5 MG immediate release tablet Take 1 tablet (5 mg total) by mouth every 4 (four) hours as needed for moderate pain. Patient not taking: Reported on 02/16/2021 12/27/20   Samella Parr, NP  silver sulfADIAZINE (SILVADENE) 1 % cream Apply  topically daily. Patient not taking: No sig reported 01/04/21   Samella Parr, NP  traZODone (DESYREL) 50 MG tablet Take 1 tablet (50 mg total) by mouth at bedtime. 01/29/21   Patrecia Pour, NP    Allergies Amoxicillin, Bee venom, Penicillin g, Penicillins, Adhesive [tape], Latex, and Vancomycin   REVIEW OF SYSTEMS  Negative except as noted here or in the History of Present Illness.   PHYSICAL EXAMINATION  Initial Vital Signs Blood pressure 121/67, pulse 90, temperature 98.1 F (36.7 C), temperature source Oral, resp. rate 20, height 5\' 3"  (1.6 m), weight (!) 195 kg, SpO2 99 %.  Examination General: Well-developed, high BMI female in no acute distress; appearance consistent with age of record HENT: normocephalic; atraumatic Eyes: pupils equal, round and reactive to light; extraocular muscles intact Neck: supple Heart: regular rate and rhythm Lungs: clear to auscultation bilaterally Abdomen: soft; obese; nontender; bowel sounds present Extremities: No deformity; full range of motion; pulses normal Neurologic: Awake, alert and oriented; motor function intact in all extremities and symmetric; no facial droop Skin: Warm and dry Psychiatric: Depressed mood with congruent affect; SI   RESULTS  Summary of this visit's results, reviewed and interpreted by myself:   EKG Interpretation  Date/Time:  Friday February 16 2021 00:24:33 EST Ventricular Rate:  79 PR Interval:  167 QRS Duration: 162 QT Interval:  398 QTC Calculation: 457 R Axis:   45 Text Interpretation: Sinus rhythm Right bundle branch block No significant change was found Confirmed by Kaelyn Nauta, Jenny Reichmann [phone]) on 02/16/2021 12:33:55 AM       Laboratory Studies: Results for orders placed or performed during the hospital encounter of 02/15/21 (from the past 24 hour(s))  Comprehensive metabolic panel     Status: Abnormal   Collection Time: 02/16/21 12:59 AM  Result Value Ref Range   Sodium 136 135 - 145 mmol/L    Potassium 4.5 3.5 - 5.1 mmol/L   Chloride 104 98 - 111 mmol/L   CO2 23 22 - 32 mmol/L  Glucose, Bld 95 70 - 99 mg/dL   BUN 25 (H) 6 - 20 mg/dL   Creatinine, Ser 1.20 (H) 0.44 - 1.00 mg/dL   Calcium 10.0 8.9 - 10.3 mg/dL   Total Protein 7.9 6.5 - 8.1 g/dL   Albumin 3.7 3.5 - 5.0 g/dL   AST 16 15 - 41 U/L   ALT 13 0 - 44 U/L   Alkaline Phosphatase 57 38 - 126 U/L   Total Bilirubin 0.4 0.3 - 1.2 mg/dL   GFR, Estimated 58 (L) >60 mL/min   Anion gap 9 5 - 15  Ethanol     Status: None   Collection Time: 02/16/21 12:59 AM  Result Value Ref Range   Alcohol, Ethyl (B) <10 <10 mg/dL  CBC with Diff     Status: Abnormal   Collection Time: 02/16/21 12:59 AM  Result Value Ref Range   WBC 13.1 (H) 4.0 - 10.5 K/uL   RBC 4.90 3.87 - 5.11 MIL/uL   Hemoglobin 12.5 12.0 - 15.0 g/dL   HCT 41.3 36.0 - 46.0 %   MCV 84.3 80.0 - 100.0 fL   MCH 25.5 (L) 26.0 - 34.0 pg   MCHC 30.3 30.0 - 36.0 g/dL   RDW 17.4 (H) 11.5 - 15.5 %   Platelets 394 150 - 400 K/uL   nRBC 0.0 0.0 - 0.2 %   Neutrophils Relative % 71 %   Neutro Abs 9.3 (H) 1.7 - 7.7 K/uL   Lymphocytes Relative 20 %   Lymphs Abs 2.6 0.7 - 4.0 K/uL   Monocytes Relative 5 %   Monocytes Absolute 0.7 0.1 - 1.0 K/uL   Eosinophils Relative 3 %   Eosinophils Absolute 0.4 0.0 - 0.5 K/uL   Basophils Relative 0 %   Basophils Absolute 0.0 0.0 - 0.1 K/uL   Immature Granulocytes 1 %   Abs Immature Granulocytes 0.09 (H) 5.42 - 7.06 K/uL  Salicylate level     Status: Abnormal   Collection Time: 02/16/21 12:59 AM  Result Value Ref Range   Salicylate Lvl <2.3 (L) 7.0 - 30.0 mg/dL  Acetaminophen level     Status: Abnormal   Collection Time: 02/16/21 12:59 AM  Result Value Ref Range   Acetaminophen (Tylenol), Serum <10 (L) 10 - 30 ug/mL  CBG monitoring, ED     Status: None   Collection Time: 02/16/21  1:03 AM  Result Value Ref Range   Glucose-Capillary 91 70 - 99 mg/dL  hCG, quantitative, pregnancy     Status: None   Collection Time: 02/16/21  1:05  AM  Result Value Ref Range   hCG, Beta Chain, Quant, S 3 <5 mIU/mL   Imaging Studies: DG Knee Complete 4 Views Left  Result Date: 02/16/2021 CLINICAL DATA:  Fall, left knee pain and weakness. EXAM: LEFT KNEE - COMPLETE 4+ VIEW COMPARISON:  08/10/2020. FINDINGS: No acute fracture or dislocation is seen. There is a bony fragment along the lateral aspect of the patellar border which is unchanged from the prior exam. The patella is high riding. Moderate to severe joint space narrowing, subchondral sclerosis, and osteophyte formation is noted at all 3 compartments. There is no obvious joint effusion. No focal soft tissue abnormality is identified. IMPRESSION: 1. No acute fracture or dislocation. 2. Moderate to severe tricompartmental degenerative changes. Electronically Signed   By: Brett Fairy M.D.   On: 02/16/2021 00:35    ED COURSE and MDM  Nursing notes, initial and subsequent vitals signs, including pulse oximetry, reviewed and  interpreted by myself.  Vitals:   02/15/21 2323 02/15/21 2325 02/16/21 0028 02/16/21 0030  BP: 121/67  112/64 105/64  Pulse: 90  82 79  Resp: 20  16 17   Temp: 98.1 F (36.7 C)     TempSrc: Oral     SpO2: 99%  99% 100%  Weight:  (!) 195 kg    Height:  5\' 3"  (1.6 m)     Medications  acetaminophen (TYLENOL) tablet 650 mg (has no administration in time range)  nicotine (NICODERM CQ - dosed in mg/24 hours) patch 21 mg (has no administration in time range)   Patient will board overnight pending available space at [facility]  Patient placed in left knee brace for knee sprain.   PROCEDURES  Procedures   ED DIAGNOSES     ICD-10-CM   1. Suicidal ideation  R45.851     2. Homelessness  Z59.00     3. Depressed mood  R45.89     4. Sprain of left knee, unspecified ligament, initial encounter  S83.92XA          Benigna Delisi, Jenny Reichmann, MD 02/16/21 [id number]
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
## 2021-02-15 NOTE — ED Triage Notes (Signed)
Pt BIB EMS from [facility]. Pt was transferred from [facility] to [facility] today for psych eval. Pt states that she is having SI due to frustration of being unable to get the resources she needs. Pts plan is to OD on her oxycodone and trazodone. [facility] stated they could accommodate her due to her size and chronic leg weakness.
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
## 2021-02-16 ENCOUNTER — Other Ambulatory Visit (HOSPITAL_BASED_OUTPATIENT_CLINIC_OR_DEPARTMENT_OTHER): Payer: Self-pay

## 2021-02-16 ENCOUNTER — Emergency Department (HOSPITAL_COMMUNITY): Payer: 59

## 2021-02-16 DIAGNOSIS — S8392XA Sprain of unspecified site of left knee, initial encounter: Secondary | ICD-10-CM | POA: Diagnosis not present

## 2021-02-16 DIAGNOSIS — F331 Major depressive disorder, recurrent, moderate: Secondary | ICD-10-CM | POA: Diagnosis not present

## 2021-02-16 LAB — COMPREHENSIVE METABOLIC PANEL
ALT: 13 U/L (ref 0–44)
AST: 16 U/L (ref 15–41)
Albumin: 3.7 g/dL (ref 3.5–5.0)
Alkaline Phosphatase: 57 U/L (ref 38–126)
Anion gap: 9 (ref 5–15)
BUN: 25 mg/dL — ABNORMAL HIGH (ref 6–20)
CO2: 23 mmol/L (ref 22–32)
Calcium: 10 mg/dL (ref 8.9–10.3)
Chloride: 104 mmol/L (ref 98–111)
Creatinine, Ser: 1.2 mg/dL — ABNORMAL HIGH (ref 0.44–1.00)
GFR, Estimated: 58 mL/min — ABNORMAL LOW (ref >60)
Glucose, Bld: 95 mg/dL (ref 70–99)
Potassium: 4.5 mmol/L (ref 3.5–5.1)
Sodium: 136 mmol/L (ref 135–145)
Total Bilirubin: 0.4 mg/dL (ref 0.3–1.2)
Total Protein: 7.9 g/dL (ref 6.5–8.1)

## 2021-02-16 LAB — SALICYLATE LEVEL: Salicylate Lvl: <7 mg/dL — ABNORMAL LOW (ref 7.0–30.0)

## 2021-02-16 LAB — RESP PANEL BY RT-PCR (FLU A&B, COVID) ARPGX2
Influenza A by PCR: NEGATIVE
Influenza B by PCR: NEGATIVE
SARS Coronavirus 2 by RT PCR: NEGATIVE

## 2021-02-16 LAB — CBC WITH DIFFERENTIAL/PLATELET
Abs Immature Granulocytes: 0.09 10*3/uL — ABNORMAL HIGH (ref 0.00–0.07)
Basophils Absolute: 0 10*3/uL (ref 0.0–0.1)
Basophils Relative: 0 %
Eosinophils Absolute: 0.4 10*3/uL (ref 0.0–0.5)
Eosinophils Relative: 3 %
HCT: 41.3 % (ref 36.0–46.0)
Hemoglobin: 12.5 g/dL (ref 12.0–15.0)
Immature Granulocytes: 1 %
Lymphocytes Relative: 20 %
Lymphs Abs: 2.6 10*3/uL (ref 0.7–4.0)
MCH: 25.5 pg — ABNORMAL LOW (ref 26.0–34.0)
MCHC: 30.3 g/dL (ref 30.0–36.0)
MCV: 84.3 fL (ref 80.0–100.0)
Monocytes Absolute: 0.7 10*3/uL (ref 0.1–1.0)
Monocytes Relative: 5 %
Neutro Abs: 9.3 10*3/uL — ABNORMAL HIGH (ref 1.7–7.7)
Neutrophils Relative %: 71 %
Platelets: 394 10*3/uL (ref 150–400)
RBC: 4.9 MIL/uL (ref 3.87–5.11)
RDW: 17.4 % — ABNORMAL HIGH (ref 11.5–15.5)
WBC: 13.1 10*3/uL — ABNORMAL HIGH (ref 4.0–10.5)
nRBC: 0 % (ref 0.0–0.2)

## 2021-02-16 LAB — ACETAMINOPHEN LEVEL: Acetaminophen (Tylenol), Serum: <10 ug/mL — ABNORMAL LOW (ref 10–30)

## 2021-02-16 LAB — RAPID URINE DRUG SCREEN, HOSP PERFORMED
Amphetamines: NOT DETECTED
Barbiturates: NOT DETECTED
Benzodiazepines: NOT DETECTED
Cocaine: NOT DETECTED
Opiates: NOT DETECTED
Tetrahydrocannabinol: NOT DETECTED

## 2021-02-16 LAB — CBG MONITORING, ED: Glucose-Capillary: 91 mg/dL (ref 70–99)

## 2021-02-16 LAB — ETHANOL: Alcohol, Ethyl (B): <10 mg/dL (ref <10)

## 2021-02-16 LAB — HCG, QUANTITATIVE, PREGNANCY: hCG, Beta Chain, Quant, S: 3 m[IU]/mL (ref <5)

## 2021-02-16 MED ORDER — FUROSEMIDE 40 MG PO TABS
40.0000 mg | ORAL_TABLET | Freq: Every day | ORAL | Status: DC
  Administered 2021-02-16: 40 mg via ORAL
  Filled 2021-02-16: qty 1

## 2021-02-16 MED ORDER — DICLOFENAC SODIUM 1 % EX GEL: 4.0000 g | Freq: Four times a day (QID) | CUTANEOUS | Status: DC

## 2021-02-16 MED ORDER — FLUTICASONE PROPIONATE 50 MCG/ACT NA SUSP
2.0000 | Freq: Two times a day (BID) | NASAL | Status: DC | PRN
  Filled 2021-02-16: qty 16

## 2021-02-16 MED ORDER — PREGABALIN 50 MG PO CAPS
100.0000 mg | ORAL_CAPSULE | Freq: Three times a day (TID) | ORAL | Status: DC
  Administered 2021-02-16: 100 mg via ORAL
  Filled 2021-02-16: qty 2

## 2021-02-16 MED ORDER — ROPINIROLE HCL 1 MG PO TABS
4.0000 mg | ORAL_TABLET | Freq: Every day | ORAL | Status: DC
  Filled 2021-02-16: qty 4

## 2021-02-16 MED ORDER — ACETAMINOPHEN 325 MG PO TABS
650.0000 mg | ORAL_TABLET | ORAL | Status: DC | PRN
  Administered 2021-02-16: 650 mg via ORAL
  Filled 2021-02-16: qty 2

## 2021-02-16 MED ORDER — TRAZODONE HCL 100 MG PO TABS: 50.0000 mg | ORAL_TABLET | Freq: Every day | ORAL | Status: DC

## 2021-02-16 MED ORDER — METHOCARBAMOL 500 MG PO TABS
500.0000 mg | ORAL_TABLET | Freq: Four times a day (QID) | ORAL | Status: DC
  Administered 2021-02-16 (×2): 500 mg via ORAL
  Filled 2021-02-16 (×2): qty 1

## 2021-02-16 MED ORDER — ZIPRASIDONE HCL 20 MG PO CAPS
20.0000 mg | ORAL_CAPSULE | Freq: Every day | ORAL | Status: DC
  Administered 2021-02-16: 20 mg via ORAL
  Filled 2021-02-16: qty 1

## 2021-02-16 MED ORDER — MOMETASONE FURO-FORMOTEROL FUM 100-5 MCG/ACT IN AERO
2.0000 | INHALATION_SPRAY | Freq: Two times a day (BID) | RESPIRATORY_TRACT | Status: DC
Start: 2021-02-16 — End: 2021-02-16

## 2021-02-16 MED ORDER — SENNOSIDES-DOCUSATE SODIUM 8.6-50 MG PO TABS
2.0000 | ORAL_TABLET | Freq: Every day | ORAL | Status: DC
  Administered 2021-02-16: 2 via ORAL
  Filled 2021-02-16 (×2): qty 2

## 2021-02-16 MED ORDER — TOPIRAMATE 25 MG PO TABS
25.0000 mg | ORAL_TABLET | Freq: Two times a day (BID) | ORAL | Status: DC
  Administered 2021-02-16: 25 mg via ORAL
  Filled 2021-02-16: qty 1

## 2021-02-16 MED ORDER — POTASSIUM CHLORIDE CRYS ER 20 MEQ PO TBCR
20.0000 meq | EXTENDED_RELEASE_TABLET | Freq: Every day | ORAL | Status: DC
  Administered 2021-02-16: 20 meq via ORAL
  Filled 2021-02-16: qty 1

## 2021-02-16 MED ORDER — CEPHALEXIN 500 MG PO CAPS
500.0000 mg | ORAL_CAPSULE | Freq: Four times a day (QID) | ORAL | Status: DC
  Filled 2021-02-16: qty 1

## 2021-02-16 MED ORDER — ONDANSETRON 4 MG PO TBDP: 4.0000 mg | ORAL_TABLET | Freq: Three times a day (TID) | ORAL | Status: DC | PRN

## 2021-02-16 MED ORDER — CLONAZEPAM 0.125 MG PO TBDP: 0.2500 mg | ORAL_TABLET | Freq: Every day | ORAL | Status: DC | PRN

## 2021-02-16 MED ORDER — DOXYCYCLINE HYCLATE 50 MG PO CAPS: 50.0000 mg | ORAL_CAPSULE | Freq: Every day | ORAL | Status: DC

## 2021-02-16 MED ORDER — NICOTINE 21 MG/24HR TD PT24
21.0000 mg | MEDICATED_PATCH | Freq: Every day | TRANSDERMAL | Status: DC
  Filled 2021-02-16: qty 1

## 2021-02-16 MED ORDER — BUPROPION HCL ER (XL) 150 MG PO TB24
300.0000 mg | ORAL_TABLET | Freq: Every day | ORAL | Status: DC
  Administered 2021-02-16: 300 mg via ORAL
  Filled 2021-02-16: qty 2

## 2021-02-16 NOTE — ED Notes (Addendum)
Pt. Wanded by security and placed in hospital gown. Pt. Has 3 belongings bag and 1 purple bookbag,1 black small bag and 1 walker. Pt. Belongings locked up at the nurses station in 9-12 side.

## 2021-02-16 NOTE — Progress Notes (Signed)
CSW informed pt of her discharge and inquired about address for the taxi. Pt stated "I have nowhere to go." CSW asked if she can stay with her mom, pt stated she can not. Pt stated her mother paid for a hotel for her last time, however, does not have any more funds. CSW called pt's pastor at pt's request. CSW called Jamal Broadnax([phone]) and inquired about assistance with housing. Mr. Mikle Bosworth stated he is unable to assist. Pt requested to go to [facility], CSW called [employer] and filled out taxi voucher, pt stated she wanted to change addresses to the hotel she was staying at to pick up her walker. CSW called [employer] and changed the address. When taxi came pt refused to get in taxi. This is the second time pt has refused taxi after it has been called. Both vouchers add up to 40$. Pt is refusing to leave room security to escort pt to the lobby.  Arlie Solomons.Jaala Bohle, MSW, [place]  Transitions of Care Clinical Social Worker I Direct Dial: [phone]  Fax: [phone] [email or web address]

## 2021-02-16 NOTE — Progress Notes (Signed)
Per Clarise Cruz assist Dir pt was given  [employer] taxi voucher.  Arlie Solomons.Makenli Derstine, MSW, [place]  Transitions of Care Clinical Social Worker I Direct Dial: [phone]  Fax: [phone] [email or web address]

## 2021-02-16 NOTE — Discharge Instructions (Addendum)
It was our pleasure to provide your ER care today - we hope that you feel better.  Use resource guides provided to help access social services/shelters/etc.  Follow up with primary care doctor and with behavioral health provider as outpatient in the next 1-2 weeks.   Return to ER if worse, new symptoms, high fevers, trouble breathing, or other emergency concern.

## 2021-02-16 NOTE — ED Provider Notes (Signed)
[facility] team has reassessed and indicates psych clear for d/c.  SW/TOC also indicates they have provided pt all the resource available in terms of social support services, shelter information, etc., and that from their standpoint pt is also ready for d/c.   Pt w normal mood and affect. Pt does not appear to be responding to internal stimuli - no hallucinations or delusions noted. Patient does not voice any thoughts of harm to self or others.   Pt currently appears stable for d/c per [facility] plan.      Lajean Saver, MD 02/16/21 1550

## 2021-02-16 NOTE — ED Notes (Signed)
Report given to [employer], RN.

## 2021-02-16 NOTE — Progress Notes (Addendum)
TOC CSW spoke with pt about d/c planning, pt stated she was here in ED because of suicidal ideations and pt is homeless. pt stated her mother got her a hotel room last time she was d/c from [facility], however she is unable to assist. Pt stated she spoke with Malachi Pro from [place] and requested medical records for placement. CSW informed pt she would not be able to wait in ED for placement when up for d/c. pt is currently waiting on a psych eval.  CSW continue to follow.   [place] Pt is Psy cleared at this time. Pt is aware of her barriers to placement. Pt is requesting her medical records. Shelter resource are attached to pt's AVS.   Heather [name].Heather [name], MSW, [place]  Transitions of Care Clinical Social Worker I Direct Dial: [phone]  Fax: [phone] [email or web address]

## 2021-02-16 NOTE — Consult Note (Addendum)
Telepsych Consultation   Reason for Consult:  psych consult Referring Physician:  Margorie John, PA-C Location of Patient: [id number] Location of Provider: [place] [facility]  Patient Identification: Heather [name] MRN:  [id number] Principal Diagnosis: Major depressive disorder, recurrent episode, moderate ([place]) Diagnosis:  Principal Problem:   Major depressive disorder, recurrent episode, moderate ([place]) Active Problems:   Adjustment disorder   Social problem   Homelessness   Total Time spent with patient: 20 minutes  Subjective:   Heather [name] is a 45 y.o. female patient admitted with suicidal ideation.  On assessment patient presents calm and flat. Endorses increased stress related to situation and current living conditions since her husband left her earlier in the year and DSS removed her children. Patient states she is not currently well due to multiple medical issues and needs help taking care of herself. She says she's been approved for long-term care but has yet to find placement; meanwhile has been living in a motel that she says is not Therapist, sports and compounding her medical/mental state.   Per chart review, x10 ED encounters within past month for similar presentations where patient notes chronic suicidal ideations secondary to homelessness and social issues. Patient has been referred to outpatient services, last seen 01/29/21; next appointment noted 04/23/20 with Diamantina Monks. Patient notes having an active PASSR form for placement and states she "can't make out there anymore. I don't have anymore money and it's not fair. I shouldn't have to live like this. I can't take care of myself. I'm sick. I keep coming in here for medical problems and they make psychiatric when I'm really sick".    Patient endorses chronic suicidal ideations and auditory hallucinations that she reports began x3 months ago; denies any active hallucinations at the time of assessment. She denies  any homicidal ideations, visual hallucinations, and does not appear to be responding to any external/internal stimuli. Provider discussed open access/walk-in hours and services to establish care for therapy.    HPI:  Heather [name] is a 45 year old female patient with past history of MDD recurrent episode moderate, generalized anxiety disorder, PTSD, mood disorder, adjustment disorder and various medical conditions who presented to [facility] after being transferred from [facility] for assessment of suicidal ideations.  PDMP reviewed; Oxycodone 5 mg TAB (30), Pregabalin 100 mg CAP (90) filled 12/27/20, Clonazepam 0.5 mg (15) TAB filled 01/04/21.   Past Psychiatric History: MDD recurrent episode moderate, generalized anxiety disorder, PTSD, mood disorder, adjustment disorder  Risk to Self:   Risk to Others:   Prior Inpatient Therapy:   Prior Outpatient Therapy:    Past Medical History:  Past Medical History:  Diagnosis Date   Anxiety    Arthritis    Asthma    Back pain    Constipation    COPD (chronic obstructive pulmonary disease) ([place])    Depression    Diabetes mellitus without complication ([place])    patient denies but states she has hyperglycemia-diet controlled   Dysrhythmia    DR Johnsie Cancel     Ectopic pregnancy 8469   Eosinophilic esophagitis    Diagnosed at [facility]/16/2013, untreated   GERD (gastroesophageal reflux disease)    HEARTBURN   TUMS   High cholesterol    IBS (irritable bowel syndrome)    Leukocytosis 07/28/2008   Qualifier: Diagnosis of  By: Jonna Munro MD, Cornelius     Morbid obesity [facility])    Neuromuscular disorder ([place])    RESTLESS LEG    Obesity  Schizoaffective disorder, bipolar type ([place])    Sleep apnea    CPAP- in process of restarting     Past Surgical History:  Procedure Laterality Date   BIOPSY  08/13/2018   Procedure: BIOPSY;  Surgeon: Yetta Flock, MD;  Location: [facility];  Service: Gastroenterology;;   CESAREAN SECTION MULTI-GESTATIONAL N/A  02/03/2017   Procedure: CESAREAN SECTION MULTI-GESTATIONAL;  Surgeon: Jonnie Kind, MD;  Location: [place];  Service: Obstetrics;  Laterality: N/A;   CHOLECYSTECTOMY     COLONOSCOPY WITH PROPOFOL N/A 08/13/2018   Procedure: COLONOSCOPY WITH PROPOFOL;  Surgeon: Yetta Flock, MD;  Location: [facility];  Service: Gastroenterology;  Laterality: N/A;   DENTAL SURGERY     ESOPHAGOGASTRODUODENOSCOPY  May 2007   Dr. Gala Romney: Normal esophagus, stomach, D1, D2   ESOPHAGOGASTRODUODENOSCOPY  06/16/2013   Dr. Carlton Adam, eosinophilic esophagitis, reactive gastropathy, no esophageal dilation   ESOPHAGOGASTRODUODENOSCOPY (EGD) WITH PROPOFOL N/A 08/13/2018   Procedure: ESOPHAGOGASTRODUODENOSCOPY (EGD) WITH PROPOFOL;  Surgeon: Yetta Flock, MD;  Location: [facility];  Service: Gastroenterology;  Laterality: N/A;   POLYPECTOMY  08/13/2018   Procedure: POLYPECTOMY;  Surgeon: Yetta Flock, MD;  Location: [facility];  Service: Gastroenterology;;   TONSILLECTOMY     TOOTH EXTRACTION  10/28/2011   Procedure: DENTAL RESTORATION/EXTRACTIONS;  Surgeon: Gae Bon, DDS;  Location: [facility] OR;  Service: Oral Surgery;;   UPPER GASTROINTESTINAL ENDOSCOPY     Family History:  Family History  Problem Relation Age of Onset   Depression Mother    Anxiety disorder Mother    High blood pressure Mother    Bipolar disorder Mother    Eating disorder Mother    Obesity Mother    Hypertension Sister    Allergic rhinitis Sister    Colon polyps Maternal Grandmother        23s   Diabetes Maternal Grandmother    Anxiety disorder Maternal Grandmother    COPD Maternal Grandmother    Crohn's disease Maternal Aunt    Cancer Maternal Grandfather        prostate   HIV/AIDS Father    Eating disorder Father    Obesity Father    Liver disease Neg Hx    Angioedema Neg Hx    Eczema Neg Hx    Immunodeficiency Neg Hx    Asthma Neg Hx    Urticaria Neg Hx    Colon cancer Neg Hx    Esophageal cancer  Neg Hx    Rectal cancer Neg Hx    Stomach cancer Neg Hx    Family Psychiatric  History: not noted Social History:  Social History   Substance and Sexual Activity  Alcohol Use Not Currently   Comment: occasional "relapse" of a wine cooler     Social History   Substance and Sexual Activity  Drug Use Not Currently   Types: Marijuana    Social History   Socioeconomic History   Marital status: Legally Separated    Spouse name: Gwyndolyn Saxon   Number of children: 0   Years of education: Not on file   Highest education level: Not on file  Occupational History   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 had a pack the other day  Other Topics Concern   Not on file  Social History Narrative   Not on file   Social Determinants of Health   Financial Resource Strain: Not on file  Food Insecurity: Not on file  Transportation Needs: Not on file  Physical Activity: Not on file  Stress: Not on file  Social Connections: Not on file   Additional Social History:    Allergies:   Allergies  Allergen Reactions   Amoxicillin Anaphylaxis   Bee Venom Shortness Of Breath and Swelling   Penicillin G Anaphylaxis   Penicillins Anaphylaxis    Tolerates ceftriaxone Has patient had a PCN reaction causing immediate rash, facial/tongue/throat swelling, SOB or lightheadedness with hypotension: No Has patient had a PCN reaction causing severe rash involving mucus membranes or skin  necrosis: No Has patient had a PCN reaction that required hospitalization No Has patient had a PCN reaction occurring within the last 10 years: No If all of the above answers are "NO", then may proceed with Cephalosporin use.'  REACTION: Angioedema   Adhesive [Tape] Itching and Rash   Latex Itching and Rash   Vancomycin Other (See Comments)    Pt can tolerate Vancomycin but did cause Vancomycin Infusion Reaction.  Recommend to pre-medicate with Benadryl before doses administered.      Labs:  Results for orders placed or performed during the hospital encounter of 02/15/21 (from the past 48 hour(s))  Resp Panel by RT-PCR (Flu A&B, Covid) Nasopharyngeal Swab     Status: None   Collection Time: 02/15/21 11:23 PM   Specimen: Nasopharyngeal Swab; Nasopharyngeal(NP) swabs in vial transport medium  Result Value Ref Range   SARS Coronavirus 2 by RT PCR NEGATIVE NEGATIVE    Comment: (NOTE) SARS-CoV-2 target nucleic acids are NOT DETECTED.  The SARS-CoV-2 RNA is generally detectable in upper respiratory specimens during the acute phase of infection. The lowest concentration of SARS-CoV-2 viral copies this assay can detect is 138 copies/mL. A negative result does not preclude SARS-Cov-2 infection and should not be used as the sole basis for treatment or other patient management decisions. A negative result may occur with  improper specimen collection/handling, submission of specimen other than nasopharyngeal swab, presence of viral mutation(s) within the areas targeted by this assay, and inadequate number of viral copies(<138 copies/mL). A negative result must be combined with clinical observations, patient history, and epidemiological information. The expected result is Negative.  Fact Sheet for Patients:  EntrepreneurPulse.com.au  Fact Sheet for Healthcare Providers:  IncredibleEmployment.be  This test is no t yet approved or cleared by the [place] FDA and  has been authorized for detection and/or diagnosis of SARS-CoV-2 by FDA under an Emergency Use Authorization (EUA). This EUA will remain  in effect (meaning this test can be used) for the duration of the COVID-19 declaration under Section 564(b)(1) of the Act, 21 U.S.C.section 360bbb-3(b)(1), unless the authorization is terminated  or revoked sooner.       Influenza A by PCR NEGATIVE NEGATIVE   Influenza B by PCR NEGATIVE NEGATIVE    Comment: (NOTE) The Xpert Xpress SARS-CoV-2/FLU/RSV plus assay is intended as an aid in the  diagnosis of influenza from Nasopharyngeal swab specimens and should not be used as a sole basis for treatment. Nasal washings and aspirates are unacceptable for Xpert Xpress SARS-CoV-2/FLU/RSV testing.  Fact Sheet for Patients: EntrepreneurPulse.com.au  Fact Sheet for Healthcare Providers: IncredibleEmployment.be  This test is not yet approved or cleared by the [place] FDA and has been authorized for detection and/or diagnosis of SARS-CoV-2 by FDA under an Emergency Use Authorization (EUA). This EUA will remain in effect (meaning this test can be used) for the duration of the COVID-19 declaration under Section 564(b)(1) of the Act, 21 U.S.C. section 360bbb-3(b)(1), unless the authorization is terminated or revoked.  Performed at [facility], [place] [address], [place], [place] [address]   Comprehensive metabolic panel     Status: Abnormal   Collection Time: 02/16/21 12:59 AM  Result Value Ref Range   Sodium 136 135 - 145 mmol/L   Potassium 4.5 3.5 - 5.1 mmol/L   Chloride 104 98 - 111 mmol/L   CO2 23 22 - 32 mmol/L   Glucose, Bld 95 70 - 99 mg/dL    Comment: Glucose reference range applies only to samples taken after fasting for at least 8 hours.   BUN 25 (H) 6 - 20 mg/dL   Creatinine, Ser 1.20 (H) 0.44 - 1.00 mg/dL   Calcium 10.0 8.9 - 10.3 mg/dL   Total Protein 7.9 6.5 - 8.1  g/dL   Albumin 3.7 3.5 - 5.0 g/dL   AST 16 15 - 41 U/L   ALT 13 0 - 44 U/L   Alkaline Phosphatase 57 38 - 126 U/L   Total Bilirubin 0.4 0.3 - 1.2 mg/dL   GFR, Estimated 58 (L) >60 mL/min    Comment: (NOTE) Calculated using the CKD-EPI Creatinine Equation (2021)    Anion gap 9 5 - 15    Comment: Performed at [facility], [place] [address], [place], [place] [address]  Ethanol     Status: None   Collection Time: 02/16/21 12:59 AM  Result Value Ref Range   Alcohol, Ethyl (B) <10 <10 mg/dL    Comment: (NOTE) Lowest detectable limit for serum alcohol is 10 mg/dL.  For medical purposes only. Performed at [facility], [place] [address] [place], [place] [address]   CBC with Diff     Status: Abnormal   Collection Time: 02/16/21 12:59 AM  Result Value Ref Range   WBC 13.1 (H) 4.0 - 10.5 K/uL   RBC 4.90 3.87 - 5.11 MIL/uL   Hemoglobin 12.5 12.0 - 15.0 g/dL   HCT 41.3 36.0 - 46.0 %   MCV 84.3 80.0 - 100.0 fL   MCH 25.5 (L) 26.0 - 34.0 pg   MCHC 30.3 30.0 - 36.0 g/dL   RDW 17.4 (H) 11.5 - 15.5 %   Platelets 394 150 - 400 K/uL   nRBC 0.0 0.0 - 0.2 %   Neutrophils Relative % 71 %   Neutro Abs 9.3 (H) 1.7 - 7.7 K/uL   Lymphocytes Relative 20 %   Lymphs Abs 2.6 0.7 - 4.0 K/uL   Monocytes Relative 5 %   Monocytes Absolute 0.7 0.1 - 1.0 K/uL   Eosinophils Relative 3 %   Eosinophils Absolute 0.4 0.0 - 0.5 K/uL   Basophils Relative 0 %   Basophils Absolute 0.0 0.0 - 0.1 K/uL   Immature Granulocytes 1 %   Abs Immature Granulocytes 0.09 (H) 0.00 - 0.07 K/uL    Comment: Performed at [facility], [place] [address], [place], [place] [address]  Salicylate level     Status: Abnormal   Collection Time: 02/16/21 12:59 AM  Result Value Ref Range   Salicylate Lvl <6.2 (L) 7.0 - 30.0 mg/dL    Comment: Performed at [facility], [place] [address], [place], [place] [address]  Acetaminophen level     Status: Abnormal   Collection  Time: 02/16/21 12:59 AM  Result Value Ref Range   Acetaminophen (Tylenol), Serum <10 (L) 10 - 30 ug/mL    Comment: (NOTE) Therapeutic concentrations vary significantly. A range of 10-30 ug/mL  may be an effective concentration for many patients. However, some  are best treated at concentrations outside of this range. Acetaminophen concentrations >150 ug/mL at 4 hours after ingestion  and >50 ug/mL at 12 hours after ingestion are often associated with  toxic reactions.  Performed at [facility], [place] [address] [place], [place] [address]   CBG monitoring, ED     Status: None   Collection Time: 02/16/21  1:03 AM  Result Value Ref Range   Glucose-Capillary 91 70 - 99 mg/dL    Comment: Glucose reference range applies only to samples taken after fasting for at least 8 hours.  hCG, quantitative, pregnancy     Status: None   Collection Time: 02/16/21  1:05 AM  Result Value Ref Range   hCG, Beta Chain, Quant, S 3 <5 mIU/mL    Comment:          GEST. AGE      CONC.  (mIU/mL)   <=1 WEEK        5 - 50     2 WEEKS       50 - 500     3 WEEKS       100 - 10,000     4 WEEKS     1,000 - 30,000     5 WEEKS     3,500 - 115,000   6-8 WEEKS     12,000 - 270,000    12 WEEKS     15,000 - 220,000        FEMALE AND NON-PREGNANT FEMALE:     LESS THAN 5 mIU/mL Performed at [facility], [place] [address], [place], [place] [address]   Urine rapid drug screen (hosp performed)     Status: None   Collection Time: 02/16/21  4:08 AM  Result Value Ref Range   Opiates NONE DETECTED NONE DETECTED   Cocaine NONE DETECTED NONE DETECTED   Benzodiazepines NONE DETECTED NONE DETECTED   Amphetamines NONE DETECTED NONE DETECTED   Tetrahydrocannabinol NONE DETECTED NONE DETECTED   Barbiturates NONE DETECTED NONE DETECTED    Comment: (NOTE) DRUG SCREEN FOR MEDICAL PURPOSES ONLY.  IF CONFIRMATION IS NEEDED FOR ANY PURPOSE, NOTIFY LAB WITHIN 5 DAYS.  LOWEST DETECTABLE  LIMITS FOR URINE DRUG SCREEN Drug Class                     Cutoff (ng/mL) Amphetamine and metabolites    1000 Barbiturate and metabolites    200 Benzodiazepine                 812 Tricyclics and metabolites     300 Opiates and metabolites        300 Cocaine and metabolites        300 THC  50 Performed at [facility], [place] [address] [place], [place] [address]     Medications:  Current Facility-Administered Medications  Medication Dose Route Frequency Provider Last Rate Last Admin   acetaminophen (TYLENOL) tablet 650 mg  650 mg Oral Q4H PRN Molpus, John, MD   650 mg at 02/16/21 1122   buPROPion (WELLBUTRIN XL) 24 hr tablet 300 mg  300 mg Oral Daily Molpus, John, MD   300 mg at 02/16/21 1122   cephALEXin (KEFLEX) capsule 500 mg  500 mg Oral QID Molpus, John, MD       clonazePAM (KLONOPIN) disintegrating tablet 0.25 mg  0.25 mg Oral Daily PRN Molpus, John, MD       diclofenac Sodium (VOLTAREN) 1 % topical gel 4 g  4 g Topical QID Molpus, John, MD       doxycycline (VIBRAMYCIN) 50 MG capsule 50 mg  50 mg Oral QHS Molpus, John, MD       fluticasone (FLONASE) 50 MCG/ACT nasal spray 2 spray  2 spray Each Nare BID PRN Molpus, John, MD       furosemide (LASIX) tablet 40 mg  40 mg Oral Daily Molpus, John, MD   40 mg at 02/16/21 1124   methocarbamol (ROBAXIN) tablet 500 mg  500 mg Oral QID Molpus, John, MD   500 mg at 02/16/21 1125   mometasone-formoterol (DULERA) 100-5 MCG/ACT inhaler 2 puff  2 puff Inhalation BID Molpus, John, MD       nicotine (NICODERM CQ - dosed in mg/24 hours) patch 21 mg  21 mg Transdermal Daily Molpus, John, MD       ondansetron (ZOFRAN-ODT) disintegrating tablet 4 mg  4 mg Oral Q8H PRN Molpus, John, MD       potassium chloride SA (KLOR-CON) CR tablet 20 mEq  20 mEq Oral Daily Molpus, John, MD   20 mEq at 02/16/21 1126   pregabalin (LYRICA) capsule 100 mg  100 mg Oral TID Molpus, John, MD   100 mg at 02/16/21 1127    rOPINIRole (REQUIP) tablet 4 mg  4 mg Oral QHS Molpus, John, MD       senna-docusate (Senokot-S) tablet 2 tablet  2 tablet Oral Daily Molpus, John, MD   2 tablet at 02/16/21 1127   topiramate (TOPAMAX) tablet 25 mg  25 mg Oral BID Molpus, John, MD   25 mg at 02/16/21 1128   traZODone (DESYREL) tablet 50 mg  50 mg Oral QHS Molpus, John, MD       ziprasidone (GEODON) capsule 20 mg  20 mg Oral Daily Molpus, John, MD   20 mg at 02/16/21 1128   Current Outpatient Medications  Medication Sig Dispense Refill   ascorbic acid (VITAMIN C) 500 MG tablet Take 1 tablet (500 mg total) by mouth daily. 30 tablet 2   buPROPion (WELLBUTRIN XL) 300 MG 24 hr tablet Take 1 tablet (300 mg total) by mouth daily. 30 tablet 3   clonazePAM (KLONOPIN) 0.5 MG tablet Take 1/2 tablet (0.25 mg total) by mouth daily as needed (panic attacks). 30 tablet 0   clotrimazole (LOTRIMIN) 1 % cream Apply topically 2 (two) times daily. 30 g 0   diclofenac Sodium (VOLTAREN) 1 % GEL Apply 4 g topically 4 (four) times daily. 100 g 2   fluticasone (FLONASE) 50 MCG/ACT nasal spray Place 2 sprays into both nostrils 2 (two) times daily. (Patient taking differently: Place 2 sprays into both nostrils 2 (two) times daily as needed for allergies or rhinitis.)  16 g 2   methocarbamol (ROBAXIN) 500 MG tablet Take 1 tablet (500 mg total) by mouth 4 (four) times daily. 120 tablet 2   mometasone-formoterol (DULERA) 100-5 MCG/ACT AERO Inhale 2 puffs into the lungs 2 (two) times daily. 13 g 2   ondansetron (ZOFRAN ODT) 4 MG disintegrating tablet Take 1 tablet (4 mg total) by mouth every 8 (eight) hours as needed for nausea or vomiting. 10 tablet 0   potassium chloride SA (KLOR-CON) 20 MEQ tablet Take 1 tablet (20 mEq total) by mouth daily. 30 tablet 3   pregabalin (LYRICA) 100 MG capsule Take 1 capsule (100 mg total) by mouth 3 (three) times daily. 90 capsule 2   rOPINIRole (REQUIP) 4 MG tablet Take 1 tablet (4 mg total) by mouth at bedtime. 30 tablet 2    senna-docusate (SENOKOT-S) 8.6-50 MG tablet Take 2 tablets by mouth daily. 60 tablet 0   topiramate (TOPAMAX) 25 MG tablet Take 1 tablet (25 mg total) by mouth 2 (two) times daily. 60 tablet 2   vitamin B-12 (CYANOCOBALAMIN) 1000 MCG tablet Take 1 tablet (1,000 mcg total) by mouth daily. 30 tablet 2   ziprasidone (GEODON) 20 MG capsule Take 1 capsule (20 mg total) by mouth daily. 30 capsule 3   cephALEXin (KEFLEX) 500 MG capsule Take 1 capsule (500 mg total) by mouth 4 (four) times daily. 40 capsule 0   doxycycline (VIBRAMYCIN) 50 MG capsule Take 1 tablet (50 mg total) by mouth at bedtime. 30 capsule 2   furosemide (LASIX) 40 MG tablet Take 1 tablet (40 mg total) by mouth daily. 30 tablet 3   nystatin (MYCOSTATIN/NYSTOP) powder Apply topically 3 (three) times daily. (Patient not taking: Reported on 02/16/2021) 15 g 0   oxyCODONE (OXY IR/ROXICODONE) 5 MG immediate release tablet Take 1 tablet (5 mg total) by mouth every 4 (four) hours as needed for moderate pain. (Patient not taking: Reported on 02/16/2021) 30 tablet 0   silver sulfADIAZINE (SILVADENE) 1 % cream Apply topically daily. (Patient not taking: No sig reported) 85 g 0   traZODone (DESYREL) 50 MG tablet Take 1 tablet (50 mg total) by mouth at bedtime. 30 tablet 2   Musculoskeletal: Strength & Muscle Tone: decreased Gait & Station:  unable to assess Patient leans: Backward  Psychiatric Specialty Exam:  Presentation  General Appearance: Casual  Eye Contact:Good  Speech:Clear and Coherent  Speech Volume:Normal  Handedness:Right  Mood and Affect  Mood:Dysphoric; Depressed  Affect:Flat; Congruent; Blunt  Thought Process  Thought Processes:Coherent; Goal Directed  Descriptions of Associations:Intact  Orientation:Full (Time, Place and Person)  Thought Content:WDL  History of Schizophrenia/Schizoaffective disorder:No  Duration of Psychotic Symptoms:No data recorded Hallucinations:Hallucinations: Auditory Description  of Auditory Hallucinations: Reports hearing voices x3 months telling her she's worthless; none at the moment Description of Visual Hallucinations: Patient reprots that she sees a little girl trying to escape by the door  Ideas of Reference:None  Suicidal Thoughts:Suicidal Thoughts: Yes, Passive SI Active Intent and/or Plan: With Intent; With Plan SI Passive Intent and/or Plan: With Plan; Without Intent  Homicidal Thoughts:Homicidal Thoughts: No   Sensorium  Memory:Immediate Good; Recent Good; Remote Good  Judgment:Intact  Insight:Present  Executive Functions  Concentration:Good  Attention Span:Good  [place] of Knowledge:Good  Language:Good  Psychomotor Activity  Psychomotor Activity:Psychomotor Activity: Normal  Assets  Assets:Communication Skills; Desire for Improvement; Social Support  Sleep  Sleep:Sleep: Fair  Physical Exam: Physical Exam Vitals and nursing note reviewed.  Constitutional:      Appearance: She  is obese.  HENT:     Head: Normocephalic.     Nose: Nose normal.     Mouth/Throat:     Mouth: Mucous membranes are moist.     Pharynx: Oropharynx is clear.  Cardiovascular:     Rate and Rhythm: Normal rate.  Musculoskeletal:     Cervical back: Normal range of motion.  Neurological:     Mental Status: She is alert and oriented to person, place, and time. Mental status is at baseline.  Psychiatric:        Attention and Perception: Attention and perception normal.        Mood and Affect: Mood is depressed. Affect is blunt.        Speech: Speech normal.        Behavior: Behavior normal. Behavior is cooperative.        Cognition and Memory: Cognition and memory normal.        Judgment: Judgment normal.     Comments: Chronic suicidal ideations   Review of Systems  Psychiatric/Behavioral:  Positive for depression and suicidal ideas.   All other systems reviewed and are negative. Blood pressure 115/72, pulse 69, temperature 98.1 F (36.7  C), temperature source Oral, resp. rate 18, height 5\' 3"  (1.6 m), weight (!) 195 kg, SpO2 100 %. Body mass index is 76.15 kg/m.  Treatment Plan Summary: Plan Patient cleared by psychiatry team; TOC/SW consult placed to follow up with placement and address any social needs.   Disposition: No evidence of imminent risk to self or others at present.   Supportive therapy provided about ongoing stressors. Discussed crisis plan, support from social network, calling 911, coming to the Emergency Department, and calling Suicide Hotline.  This service was provided via telemedicine using a 2-way, interactive audio and video technology.  Names of all persons participating in this telemedicine service and their role in this encounter. Name: Oneida Alar Role: PMHNP  Name: Hampton Abbot Role: Attending MD  Name: Wells Guiles Speth Role: patient   Name:  Role:     Inda Merlin, NP 02/16/2021 12:58 PM

## 2021-02-16 NOTE — ED Notes (Signed)
Report received from Lyndel Pleasure, Therapist, sports.

## 2021-02-16 NOTE — ED Notes (Signed)
Pt refusing to leave.  [place] Director and Police into room w/ pt to discuss discharge.  Pt to get dressed and leave. Social work Sports administrator w/ Clinical cytogeneticist to take pt to [facility].

## 2021-02-26 ENCOUNTER — Other Ambulatory Visit: Payer: Self-pay

## 2021-02-26 ENCOUNTER — Observation Stay (HOSPITAL_BASED_OUTPATIENT_CLINIC_OR_DEPARTMENT_OTHER)
Admission: EM | Admit: 2021-02-26 | Discharge: 2021-02-28 | Disposition: A | Discharge status: Home / Self Care | Payer: 59 | Attending: Internal Medicine | Admitting: Internal Medicine

## 2021-02-26 ENCOUNTER — Emergency Department (HOSPITAL_BASED_OUTPATIENT_CLINIC_OR_DEPARTMENT_OTHER): Payer: 59

## 2021-02-26 ENCOUNTER — Encounter (HOSPITAL_BASED_OUTPATIENT_CLINIC_OR_DEPARTMENT_OTHER): Payer: Self-pay

## 2021-02-26 DIAGNOSIS — Z87891 Personal history of nicotine dependence: Secondary | ICD-10-CM | POA: Diagnosis not present

## 2021-02-26 DIAGNOSIS — I129 Hypertensive chronic kidney disease with stage 1 through stage 4 chronic kidney disease, or unspecified chronic kidney disease: Secondary | ICD-10-CM | POA: Insufficient documentation

## 2021-02-26 DIAGNOSIS — J45909 Unspecified asthma, uncomplicated: Secondary | ICD-10-CM | POA: Insufficient documentation

## 2021-02-26 DIAGNOSIS — J449 Chronic obstructive pulmonary disease, unspecified: Secondary | ICD-10-CM | POA: Diagnosis not present

## 2021-02-26 DIAGNOSIS — N1832 Chronic kidney disease, stage 3b: Secondary | ICD-10-CM | POA: Diagnosis not present

## 2021-02-26 DIAGNOSIS — D631 Anemia in chronic kidney disease: Secondary | ICD-10-CM | POA: Insufficient documentation

## 2021-02-26 DIAGNOSIS — Z79899 Other long term (current) drug therapy: Secondary | ICD-10-CM | POA: Diagnosis not present

## 2021-02-26 DIAGNOSIS — Z91040 Latex allergy status: Secondary | ICD-10-CM | POA: Diagnosis not present

## 2021-02-26 DIAGNOSIS — R109 Unspecified abdominal pain: Secondary | ICD-10-CM | POA: Insufficient documentation

## 2021-02-26 DIAGNOSIS — E1122 Type 2 diabetes mellitus with diabetic chronic kidney disease: Secondary | ICD-10-CM | POA: Diagnosis not present

## 2021-02-26 DIAGNOSIS — R339 Retention of urine, unspecified: Secondary | ICD-10-CM | POA: Diagnosis not present

## 2021-02-26 DIAGNOSIS — Z20822 Contact with and (suspected) exposure to covid-19: Secondary | ICD-10-CM | POA: Insufficient documentation

## 2021-02-26 LAB — BASIC METABOLIC PANEL
Anion gap: 12 (ref 5–15)
BUN: 27 mg/dL — ABNORMAL HIGH (ref 6–20)
CO2: 24 mmol/L (ref 22–32)
Calcium: 10.7 mg/dL — ABNORMAL HIGH (ref 8.9–10.3)
Chloride: 103 mmol/L (ref 98–111)
Creatinine, Ser: 1.46 mg/dL — ABNORMAL HIGH (ref 0.44–1.00)
GFR, Estimated: 46 mL/min — ABNORMAL LOW (ref >60)
Glucose, Bld: 98 mg/dL (ref 70–99)
Potassium: 3.9 mmol/L (ref 3.5–5.1)
Sodium: 139 mmol/L (ref 135–145)

## 2021-02-26 LAB — CBC WITH DIFFERENTIAL/PLATELET
Abs Immature Granulocytes: 0.05 10*3/uL (ref 0.00–0.07)
Basophils Absolute: 0 10*3/uL (ref 0.0–0.1)
Basophils Relative: 0 %
Eosinophils Absolute: 0.2 10*3/uL (ref 0.0–0.5)
Eosinophils Relative: 1 %
HCT: 40.7 % (ref 36.0–46.0)
Hemoglobin: 12 g/dL (ref 12.0–15.0)
Immature Granulocytes: 0 %
Lymphocytes Relative: 13 %
Lymphs Abs: 1.7 10*3/uL (ref 0.7–4.0)
MCH: 24.9 pg — ABNORMAL LOW (ref 26.0–34.0)
MCHC: 29.5 g/dL — ABNORMAL LOW (ref 30.0–36.0)
MCV: 84.6 fL (ref 80.0–100.0)
Monocytes Absolute: 1 10*3/uL (ref 0.1–1.0)
Monocytes Relative: 7 %
Neutro Abs: 10.8 10*3/uL — ABNORMAL HIGH (ref 1.7–7.7)
Neutrophils Relative %: 79 %
Platelets: 403 10*3/uL — ABNORMAL HIGH (ref 150–400)
RBC: 4.81 MIL/uL (ref 3.87–5.11)
RDW: 16.8 % — ABNORMAL HIGH (ref 11.5–15.5)
WBC: 13.7 10*3/uL — ABNORMAL HIGH (ref 4.0–10.5)
nRBC: 0 % (ref 0.0–0.2)

## 2021-02-26 LAB — URINALYSIS, ROUTINE W REFLEX MICROSCOPIC
Bilirubin Urine: NEGATIVE
Glucose, UA: NEGATIVE mg/dL
Ketones, ur: NEGATIVE mg/dL
Nitrite: NEGATIVE
Protein, ur: 100 mg/dL — AB
RBC / HPF: >50 RBC/hpf — ABNORMAL HIGH (ref 0–5)
Specific Gravity, Urine: 1.018 (ref 1.005–1.030)
WBC, UA: >50 WBC/hpf — ABNORMAL HIGH (ref 0–5)
pH: 6 (ref 5.0–8.0)

## 2021-02-26 LAB — PREGNANCY, URINE: Preg Test, Ur: NEGATIVE

## 2021-02-26 LAB — RESP PANEL BY RT-PCR (FLU A&B, COVID) ARPGX2
Influenza A by PCR: NEGATIVE
Influenza B by PCR: NEGATIVE
SARS Coronavirus 2 by RT PCR: NEGATIVE

## 2021-02-26 MED ORDER — ASCORBIC ACID 500 MG PO TABS
500.0000 mg | ORAL_TABLET | Freq: Every day | ORAL | Status: DC
  Administered 2021-02-26 – 2021-02-28 (×3): 500 mg via ORAL
  Filled 2021-02-26 (×3): qty 1

## 2021-02-26 MED ORDER — FUROSEMIDE 40 MG PO TABS
40.0000 mg | ORAL_TABLET | Freq: Every day | ORAL | Status: DC
  Administered 2021-02-27 – 2021-02-28 (×2): 40 mg via ORAL
  Filled 2021-02-26 (×2): qty 1

## 2021-02-26 MED ORDER — LACTATED RINGERS IV BOLUS
1000.0000 mL | Freq: Once | INTRAVENOUS | Status: AC
  Administered 2021-02-26: 1000 mL via INTRAVENOUS

## 2021-02-26 MED ORDER — VITAMIN B-12 1000 MCG PO TABS
1000.0000 ug | ORAL_TABLET | Freq: Every day | ORAL | Status: DC
  Administered 2021-02-26 – 2021-02-28 (×3): 1000 ug via ORAL
  Filled 2021-02-26 (×3): qty 1

## 2021-02-26 MED ORDER — SILVER SULFADIAZINE 1 % EX CREA
TOPICAL_CREAM | Freq: Every day | CUTANEOUS | Status: DC
  Administered 2021-02-26: 1 via TOPICAL
  Filled 2021-02-26: qty 50

## 2021-02-26 MED ORDER — TAMSULOSIN HCL 0.4 MG PO CAPS
0.4000 mg | ORAL_CAPSULE | Freq: Every day | ORAL | Status: DC
  Administered 2021-02-26 – 2021-02-27 (×2): 0.4 mg via ORAL
  Filled 2021-02-26 (×2): qty 1

## 2021-02-26 MED ORDER — NYSTATIN 100000 UNIT/GM EX POWD
Freq: Three times a day (TID) | CUTANEOUS | Status: DC
  Filled 2021-02-26: qty 15

## 2021-02-26 MED ORDER — ENOXAPARIN SODIUM 80 MG/0.8ML IJ SOSY
80.0000 mg | PREFILLED_SYRINGE | INTRAMUSCULAR | Status: DC
  Administered 2021-02-26 – 2021-02-27 (×2): 80 mg via SUBCUTANEOUS
  Filled 2021-02-26 (×2): qty 0.8

## 2021-02-26 MED ORDER — CEFEPIME HCL 2 G IJ SOLR
2.0000 g | Freq: Once | INTRAMUSCULAR | Status: AC
  Administered 2021-02-26: 2 g via INTRAVENOUS
  Filled 2021-02-26: qty 2

## 2021-02-26 MED ORDER — CIPROFLOXACIN IN D5W 400 MG/200ML IV SOLN
400.0000 mg | Freq: Two times a day (BID) | INTRAVENOUS | Status: DC
  Administered 2021-02-26 – 2021-02-27 (×2): 400 mg via INTRAVENOUS
  Filled 2021-02-26 (×2): qty 200

## 2021-02-26 MED ORDER — CHLORHEXIDINE GLUCONATE CLOTH 2 % EX PADS
6.0000 | MEDICATED_PAD | Freq: Every day | CUTANEOUS | Status: DC
  Administered 2021-02-26 – 2021-02-28 (×3): 6 via TOPICAL

## 2021-02-26 MED ORDER — FLUOXETINE HCL 10 MG PO CAPS
30.0000 mg | ORAL_CAPSULE | Freq: Every morning | ORAL | Status: DC
  Administered 2021-02-27 – 2021-02-28 (×2): 30 mg via ORAL
  Filled 2021-02-26 (×2): qty 3

## 2021-02-26 MED ORDER — ALBUTEROL SULFATE HFA 108 (90 BASE) MCG/ACT IN AERS: 1.0000 | INHALATION_SPRAY | Freq: Four times a day (QID) | RESPIRATORY_TRACT | Status: DC | PRN

## 2021-02-26 MED ORDER — FLUTICASONE FUROATE-VILANTEROL 100-25 MCG/ACT IN AEPB
1.0000 | INHALATION_SPRAY | Freq: Every morning | RESPIRATORY_TRACT | Status: DC
  Administered 2021-02-27 – 2021-02-28 (×2): 1 via RESPIRATORY_TRACT
  Filled 2021-02-26: qty 28

## 2021-02-26 MED ORDER — PREGABALIN 50 MG PO CAPS
100.0000 mg | ORAL_CAPSULE | Freq: Three times a day (TID) | ORAL | Status: DC
  Administered 2021-02-26 – 2021-02-28 (×5): 100 mg via ORAL
  Filled 2021-02-26 (×5): qty 2

## 2021-02-26 MED ORDER — SENNOSIDES-DOCUSATE SODIUM 8.6-50 MG PO TABS
2.0000 | ORAL_TABLET | Freq: Every day | ORAL | Status: DC
  Administered 2021-02-26: 2 via ORAL
  Filled 2021-02-26 (×2): qty 2

## 2021-02-26 MED ORDER — BISACODYL 10 MG RE SUPP: 10.0000 mg | Freq: Once | RECTAL | Status: DC

## 2021-02-26 MED ORDER — ROPINIROLE HCL 1 MG PO TABS
4.0000 mg | ORAL_TABLET | Freq: Every day | ORAL | Status: DC
  Administered 2021-02-26 – 2021-02-27 (×2): 4 mg via ORAL
  Filled 2021-02-26 (×2): qty 4

## 2021-02-26 NOTE — ED Triage Notes (Signed)
Pt c/o abdominal pain, N/V/D and urinary retention. Pt reports that she has not urinated all day and has had N/V/D. Pt reports she was seen at [facility] 3 days ago & diagnosed with UTI. Pt taking Doxycyline.

## 2021-02-26 NOTE — Progress Notes (Signed)
Pharmacy Antibiotic Note  Heather [name] is a 44 y.o. female admitted on 02/26/2021 with  N/V .  Pharmacy has been consulted for ciprofloxacin dosing for UTI.   Pt has PCN allergy, but has tolerated some cephalosporins previously. However, urine culture from 01/06/21 grew enterococcus faecium which was sensitive to FQ.   Today, 02/26/21 WBC elevated SCr 1.46, CrCl ~80 mL/min Afebrile  Plan: Ciprofloxacin 400 mg IV q12h Follow renal function and culture results  Height: 5\' 3"  (160 cm) Weight: (!) 190.5 kg (420 lb) IBW/kg (Calculated) : 52.4  Temp (24hrs), Avg:98.3 F (36.8 C), Min:97.8 F (36.6 C), Max:98.7 F (37.1 C)  Recent Labs  Lab 02/26/21 0454 02/26/21 0544  WBC  --  13.7*  CREATININE 1.46*  --     Estimated Creatinine Clearance: 84.4 mL/min (A) (by C-G formula based on SCr of 1.46 mg/dL (H)).    Allergies  Allergen Reactions   Amoxicillin Anaphylaxis   Bee Venom Shortness Of Breath and Swelling   Keflex [Cephalexin] Anaphylaxis   Penicillin G Anaphylaxis   Penicillins Anaphylaxis    Tolerates ceftriaxone Has patient had a PCN reaction causing immediate rash, facial/tongue/throat swelling, SOB or lightheadedness with hypotension: No Has patient had a PCN reaction causing severe rash involving mucus membranes or skin necrosis: No Has patient had a PCN reaction that required hospitalization No Has patient had a PCN reaction occurring within the last 10 years: No If all of the above answers are "NO", then may proceed with Cephalosporin use.'  REACTION: Angioedema   Adhesive [Tape] Itching and Rash   Latex Itching and Rash   Vancomycin Other (See Comments)    Pt can tolerate Vancomycin but did cause Vancomycin Infusion Reaction.  Recommend to pre-medicate with Benadryl before doses administered.      Antimicrobials this admission: ciprofloxacin 11/21 >>  Cefepime x1 11/21  Dose adjustments this admission:  Microbiology results: 11/21 UCx:    Thank  you for allowing pharmacy to be a part of this patient's care.  Lenis Noon, PharmD 02/26/2021 6:43 PM

## 2021-02-26 NOTE — ED Provider Notes (Signed)
Homeless; recent admission, arrived w/urinary retention (800cc), now with foley. F/u CT scan. Admit. Physical Exam  BP 127/87   Pulse 82   Temp 98.4 F (36.9 C) (Oral)   Resp 15   Ht 5\' 3"  (1.6 m)   Wt (!) 190.5 kg   SpO2 92%   BMI 74.40 kg/m   Physical Exam Constitutional:      General: She is not in acute distress.    Appearance: She is obese. She is not ill-appearing or toxic-appearing.  Abdominal:     Tenderness: There is no abdominal tenderness.  Skin:    General: Skin is warm and dry.  Neurological:     General: No focal deficit present.     Mental Status: She is oriented to person, place, and time.  Psychiatric:        Mood and Affect: Mood normal.        Behavior: Behavior normal.    ED Course/Procedures   Clinical Course as of 02/26/21 0659  [facility] Feb 26, 2021  0526 Patient with greater than 800 cc output. [CH]  [id number] Patient with Foley catheter in place.  Creatinine slightly uptrending but within range of prior numbers.  Leukocytosis to 13.7 which again is in range with prior numbers.  She does have a complicated history with UTIs and strep lung calculus.  Given her acute retention, will obtain CT to make sure that there is no obstructive uropathy.  Urinalysis does show greater than 50 white cells and leukocyte Estrace.  Urine culture was sent.  She is currently on doxycycline and reports compliance.  She does not appear septic or systemically ill. [CH]  [phone] Anticipate patient may need admission.  She is homeless and has no social support.  Given her body habitus and now with indwelling catheter, do not feel she has the ability to care for this as an outpatient.  Will await CT results and plan for admission. [CH]    Clinical Course User Index [CH] Horton, Barbette Hair, MD    Procedures  MDM  CT scan showed nonobstructive calculi and redemonstration of right kidney cyst.  On assessment, patient resting with normal vital signs.  She denies any current nausea.  Patient  is agreeable to hospital admission.  Based on previous urine cultures, cefepime was ordered.  Patient admitted to hospitalist for further management.       Godfrey Pick, MD 02/27/21 [phone]

## 2021-02-26 NOTE — ED Provider Notes (Signed)
[facility] EMERGENCY DEPT Provider Note   CSN: [id number] Arrival date & time: 02/26/21  0353     History Chief Complaint  Patient presents with   Urinary Retention    Pt c/o abdominal pain, N/V/D and urinary retention. Pt reports that she has not urinated all day and has had N/V/D. Pt reports she was seen at [facility] 3 days ago & diagnosed with UTI. Pt taking Doxycyline.     Heather [name] is a 45 y.o. female.  HPI     This is a 45 year old female with history of morbid obesity, COPD, diabetes, schizoaffective disorder, staghorn calculus and frequent UTIs who presents with urinary retention abdominal pain.  Patient reports she is currently being treated with doxycycline for UTIs.  She is taking doxycycline as directed.  She reports onset of nonbilious, nonbloody emesis and nonbloody diarrhea.  She reports abdominal and back pain which is not new for her.  She is not had any fevers but does document chills.  She states she has not urinated in 24 hours.  She had a bowel movement on herself.  She is currently staying at the [facility] shelter [place].  Patient was just discharged from [facility] on 11/18.  At that time she was discharged to hotel; however, it appears that she was not able to get a room.  She presented to an outside emergency department on 11/18 with chest discomfort.  Her work-up was reassuring.  She was discharged.  Past Medical History:  Diagnosis Date   Anxiety    Arthritis    Asthma    Back pain    Constipation    COPD (chronic obstructive pulmonary disease) ([place])    Depression    Diabetes mellitus without complication ([place])    patient denies but states she has hyperglycemia-diet controlled   Dysrhythmia    DR [facility]     Ectopic pregnancy 4098   Eosinophilic esophagitis    Diagnosed at [facility] 06/16/2013, untreated   GERD (gastroesophageal reflux disease)    HEARTBURN   TUMS   High cholesterol    IBS (irritable bowel syndrome)     Leukocytosis 07/28/2008   Qualifier: Diagnosis of  By: Jonna Munro MD, Cornelius     Morbid obesity [facility])    Neuromuscular disorder ([place])    RESTLESS LEG    Obesity    Schizoaffective disorder, bipolar type ([place])    Sleep apnea    CPAP- in process of restarting     Patient Active Problem List   Diagnosis Date Noted   Homelessness 02/07/2021   Cystitis 01/26/2021   Social problem 01/26/2021   Enterococcus faecalis infection 01/26/2021   Mood disorder ([place]) 01/26/2021   Chronic pain disorder 01/26/2021   Thrombocytosis 10/31/2020   CKD (chronic kidney disease), stage III ([place]) 10/31/2020   Major depressive disorder, recurrent ([place]) 09/13/2020   Insomnia    Anemia of chronic disease    Chronic bilateral low back pain without sciatica    Debility 08/24/2020   SIRS (systemic inflammatory response syndrome) ([place]) 08/18/2020   Pressure injury of skin 08/11/2020   Rhabdomyolysis 08/10/2020   Acute renal failure ([place]) 08/10/2020   PTSD (post-traumatic stress disorder) 05/15/2020   Major depressive disorder, recurrent episode, moderate ([place]) 05/05/2020   Generalized anxiety disorder 05/05/2020   Blood in stool    Gastritis and gastroduodenitis    Benign neoplasm of sigmoid colon    Class 3 severe obesity with serious comorbidity and body mass index (BMI) greater  than or equal to 70 in adult [facility]) 06/16/2018   Nexplanon insertion 03/27/2017   Postpartum hypertension 03/05/2017   Perennial allergic rhinitis 11/05/2016   Mild persistent asthma with acute exacerbation 11/05/2016   Hypoglycemia 11/07/2015   OSA (obstructive sleep apnea) 11/07/2015   DM type 2 (diabetes mellitus, type 2) ([place]) 12/07/2014   Panniculitis 12/06/2014   Cellulitis, abdominal wall 11/11/2014   Abdominal pain 40/97/3532   Eosinophilic esophagitis 99/24/2683   Change in bowel habits 04/28/2013   RLS (restless legs syndrome) 08/08/2011   Adjustment disorder 41/96/2229   DYSMETABOLIC SYNDROME 79/89/2119    Essential hypertension 05/12/2006   Asthma 05/12/2006   OSTEOARTHRITIS 05/12/2006    Past Surgical History:  Procedure Laterality Date   BIOPSY  08/13/2018   Procedure: BIOPSY;  Surgeon: Yetta Flock, MD;  Location: [facility];  Service: Gastroenterology;;   CESAREAN SECTION MULTI-GESTATIONAL N/A 02/03/2017   Procedure: CESAREAN SECTION MULTI-GESTATIONAL;  Surgeon: Jonnie Kind, MD;  Location: [facility];  Service: Obstetrics;  Laterality: N/A;   CHOLECYSTECTOMY     COLONOSCOPY WITH PROPOFOL N/A 08/13/2018   Procedure: COLONOSCOPY WITH PROPOFOL;  Surgeon: Yetta Flock, MD;  Location: [facility];  Service: Gastroenterology;  Laterality: N/A;   DENTAL SURGERY     ESOPHAGOGASTRODUODENOSCOPY  May 2007   Dr. Gala Romney: Normal esophagus, stomach, D1, D2   ESOPHAGOGASTRODUODENOSCOPY  06/16/2013   Dr. Carlton Adam, eosinophilic esophagitis, reactive gastropathy, no esophageal dilation   ESOPHAGOGASTRODUODENOSCOPY (EGD) WITH PROPOFOL N/A 08/13/2018   Procedure: ESOPHAGOGASTRODUODENOSCOPY (EGD) WITH PROPOFOL;  Surgeon: Yetta Flock, MD;  Location: [facility];  Service: Gastroenterology;  Laterality: N/A;   POLYPECTOMY  08/13/2018   Procedure: POLYPECTOMY;  Surgeon: Yetta Flock, MD;  Location: [facility];  Service: Gastroenterology;;   TONSILLECTOMY     TOOTH EXTRACTION  10/28/2011   Procedure: DENTAL RESTORATION/EXTRACTIONS;  Surgeon: Gae Bon, DDS;  Location: [facility];  Service: Oral Surgery;;   UPPER GASTROINTESTINAL ENDOSCOPY       OB History     Gravida  2   Para  1   Term  1   Preterm      AB  1   Living  2      SAB  0   IAB      Ectopic  1   Multiple  1   Live Births  2           Family History  Problem Relation Age of Onset   Depression Mother    Anxiety disorder Mother    High blood pressure Mother    Bipolar disorder Mother    Eating disorder Mother    Obesity Mother    Hypertension Sister    Allergic rhinitis  Sister    Colon polyps Maternal Grandmother        24s   Diabetes Maternal Grandmother    Anxiety disorder Maternal Grandmother    COPD Maternal Grandmother    Crohn's disease Maternal Aunt    Cancer Maternal Grandfather        prostate   HIV/AIDS Father    Eating disorder Father    Obesity Father    Liver disease Neg Hx    Angioedema Neg Hx    Eczema Neg Hx    Immunodeficiency Neg Hx    Asthma Neg Hx    Urticaria Neg Hx    Colon cancer Neg Hx    Esophageal cancer Neg Hx    Rectal cancer Neg Hx  Stomach cancer Neg Hx     Social History   Tobacco Use   Smoking status: Former    Packs/day: 0.50    Years: 8.00    Pack years: 4.00    Types: Cigarettes    Quit date: 04/25/2011    Years since quitting: 9.8   Smokeless tobacco: Never  Vaping Use   Vaping Use: Never used  Substance Use Topics   Alcohol use: Not Currently    Comment: occasional "relapse" of a wine cooler   Drug use: Not Currently    Types: Marijuana    Home Medications Prior to Admission medications   Medication Sig Start Date End Date Taking? Authorizing Provider  ascorbic acid (VITAMIN C) 500 MG tablet Take 1 tablet (500 mg total) by mouth daily. 12/27/20   Samella Parr, NP  buPROPion (WELLBUTRIN XL) 300 MG 24 hr tablet Take 1 tablet (300 mg total) by mouth daily. 01/29/21   Patrecia Pour, NP  cephALEXin (KEFLEX) 500 MG capsule Take 1 capsule (500 mg total) by mouth 4 (four) times daily. 02/08/21   Quintella Reichert, MD  clonazePAM (KLONOPIN) 0.5 MG tablet Take 1/2 tablet (0.25 mg total) by mouth daily as needed (panic attacks). 01/04/21   Samella Parr, NP  clotrimazole (LOTRIMIN) 1 % cream Apply topically 2 (two) times daily. 11/02/20   Shelly Coss, MD  diclofenac Sodium (VOLTAREN) 1 % GEL Apply 4 g topically 4 (four) times daily. 12/27/20   Samella Parr, NP  doxycycline (VIBRAMYCIN) 50 MG capsule Take 1 tablet (50 mg total) by mouth at bedtime. 02/15/21     fluticasone (FLONASE) 50 MCG/ACT  nasal spray Place 2 sprays into both nostrils 2 (two) times daily. Patient taking differently: Place 2 sprays into both nostrils 2 (two) times daily as needed for allergies or rhinitis. 12/27/20   Samella Parr, NP  furosemide (LASIX) 40 MG tablet Take 1 tablet (40 mg total) by mouth daily. 01/04/21   Samella Parr, NP  methocarbamol (ROBAXIN) 500 MG tablet Take 1 tablet (500 mg total) by mouth 4 (four) times daily. 12/27/20   Samella Parr, NP  mometasone-formoterol (DULERA) 100-5 MCG/ACT AERO Inhale 2 puffs into the lungs 2 (two) times daily. 12/27/20   Samella Parr, NP  nystatin (MYCOSTATIN/NYSTOP) powder Apply topically 3 (three) times daily. Patient not taking: Reported on 02/16/2021 12/27/20   Samella Parr, NP  ondansetron (ZOFRAN ODT) 4 MG disintegrating tablet Take 1 tablet (4 mg total) by mouth every 8 (eight) hours as needed for nausea or vomiting. 02/08/21   Quintella Reichert, MD  oxyCODONE (OXY IR/ROXICODONE) 5 MG immediate release tablet Take 1 tablet (5 mg total) by mouth every 4 (four) hours as needed for moderate pain. Patient not taking: Reported on 02/16/2021 12/27/20   Samella Parr, NP  potassium chloride SA (KLOR-CON) 20 MEQ tablet Take 1 tablet (20 mEq total) by mouth daily. 01/04/21   Samella Parr, NP  pregabalin (LYRICA) 100 MG capsule Take 1 capsule (100 mg total) by mouth 3 (three) times daily. 12/27/20   Samella Parr, NP  rOPINIRole (REQUIP) 4 MG tablet Take 1 tablet (4 mg total) by mouth at bedtime. 12/27/20   Samella Parr, NP  senna-docusate (SENOKOT-S) 8.6-50 MG tablet Take 2 tablets by mouth daily. 12/27/20   Samella Parr, NP  silver sulfADIAZINE (SILVADENE) 1 % cream Apply topically daily. Patient not taking: No sig reported 01/04/21   Samella Parr, NP  topiramate (TOPAMAX) 25 MG tablet Take 1 tablet (25 mg total) by mouth 2 (two) times daily. 12/27/20   Samella Parr, NP  traZODone (DESYREL) 50 MG tablet Take 1 tablet (50 mg total) by  mouth at bedtime. 01/29/21   Patrecia Pour, NP  vitamin B-12 (CYANOCOBALAMIN) 1000 MCG tablet Take 1 tablet (1,000 mcg total) by mouth daily. 12/27/20   Samella Parr, NP  ziprasidone (GEODON) 20 MG capsule Take 1 capsule (20 mg total) by mouth daily. 01/29/21   Patrecia Pour, NP    Allergies    Amoxicillin, Bee venom, Penicillin g, Penicillins, Adhesive [tape], Latex, and Vancomycin  Review of Systems   Review of Systems  Constitutional:  Positive for chills. Negative for fever.  Respiratory:  Negative for shortness of breath.   Cardiovascular:  Negative for chest pain.  Gastrointestinal:  Positive for abdominal pain, diarrhea, nausea and vomiting.  Genitourinary:  Negative for dysuria.  All other systems reviewed and are negative.  Physical Exam Updated Vital Signs BP 105/60   Pulse 82   Temp 97.8 F (36.6 C) (Oral)   Resp 15   Ht 1.6 m (5\' 3" )   Wt (!) 188.7 kg   SpO2 93%   BMI 73.69 kg/m   Physical Exam Vitals and nursing note reviewed.  Constitutional:      Appearance: She is well-developed.     Comments: Morbidly obese, no acute distress  HENT:     Head: Normocephalic and atraumatic.     Nose: Nose normal.     Mouth/Throat:     Mouth: Mucous membranes are moist.  Eyes:     Pupils: Pupils are equal, round, and reactive to light.  Cardiovascular:     Rate and Rhythm: Normal rate and regular rhythm.     Heart sounds: Normal heart sounds.  Pulmonary:     Effort: Pulmonary effort is normal. No respiratory distress.     Breath sounds: No wheezing.  Abdominal:     General: There is no distension.     Palpations: Abdomen is soft.     Tenderness: There is no abdominal tenderness.     Comments: Very limited exam secondary to body habitus  Musculoskeletal:     Cervical back: Neck supple.     Right lower leg: No edema.     Left lower leg: No edema.  Skin:    General: Skin is warm and dry.  Neurological:     Mental Status: She is alert and oriented to  person, place, and time.  Psychiatric:        Mood and Affect: Mood normal.    ED Results / Procedures / Treatments   Labs (all labs ordered are listed, but only abnormal results are displayed) Labs Reviewed  URINALYSIS, ROUTINE W REFLEX MICROSCOPIC - Abnormal; Notable for the following components:      Result Value   APPearance HAZY (*)    Hgb urine dipstick MODERATE (*)    Protein, ur 100 (*)    Leukocytes,Ua LARGE (*)    RBC / HPF >50 (*)    WBC, UA >50 (*)    Bacteria, UA FEW (*)    All other components within normal limits  BASIC METABOLIC PANEL - Abnormal; Notable for the following components:   BUN 27 (*)    Creatinine, Ser 1.46 (*)    Calcium 10.7 (*)    GFR, Estimated 46 (*)    All other components within normal limits  CBC WITH  DIFFERENTIAL/PLATELET - Abnormal; Notable for the following components:   WBC 13.7 (*)    MCH 24.9 (*)    MCHC 29.5 (*)    RDW 16.8 (*)    Platelets 403 (*)    Neutro Abs 10.8 (*)    All other components within normal limits  URINE CULTURE  PREGNANCY, URINE  CBC WITH DIFFERENTIAL/PLATELET    EKG None  Radiology No results found.  Procedures Procedures   Medications Ordered in ED Medications - No data to display  ED Course  I have reviewed the triage vital signs and the nursing notes.  Pertinent labs & imaging results that were available during my care of the patient were reviewed by me and considered in my medical decision making (see chart for details).  Clinical Course as of 02/26/21 7062  [facility] Feb 26, 2021  3762 Patient with greater than 800 cc output. [CH]  [id number] Patient with Foley catheter in place.  Creatinine slightly uptrending but within range of prior numbers.  Leukocytosis to 13.7 which again is in range with prior numbers.  She does have a complicated history with UTIs and strep lung calculus.  Given her acute retention, will obtain CT to make sure that there is no obstructive uropathy.  Urinalysis does show greater  than 50 white cells and leukocyte Estrace.  Urine culture was sent.  She is currently on doxycycline and reports compliance.  She does not appear septic or systemically ill. [CH]  [phone] Anticipate patient may need admission.  She is homeless and has no social support.  Given her body habitus and now with indwelling catheter, do not feel she has the ability to care for this as an outpatient.  Will await CT results and plan for admission. [CH]    Clinical Course User Index [CH] Veleka Djordjevic, Barbette Hair, MD   MDM Rules/Calculators/A&P                           Patient presents with complaints of urinary retention diarrhea.  She is morbidly obese.  She is nontoxic-appearing and vital signs are reassuring.  She has no social support.  I have reviewed her chart extensively.  Recent admission to [facility] for urinary tract infection.  Based on speciation, she was narrowed to doxycycline.  She reports compliance with this medication.  Foley catheter was placed and did have return of greater than 800 cc of urine.  Urinalysis today shows greater than 50 white cells and leukocyte esterase.  I sent culture.  Difficult to interpret this in the setting of recent UTI and ongoing antibiotic use.  She is not systemically ill appearing.  She has a slightly increased creatinine and her white blood cell count is 13 which is in range from prior.  Given her complicated history, will obtain CT scan.  Anticipate she will need admission.  Final Clinical Impression(s) / ED Diagnoses Final diagnoses:  Urinary retention    Rx / DC Orders ED Discharge Orders     None        Aldonia Keeven, Barbette Hair, MD 02/26/21 [phone]

## 2021-02-26 NOTE — Progress Notes (Signed)
Ciprofloxacin started @ 2003, pt called out with c/o bumps and itching at IV site.  IV stopped 2031 flushed and NS running KVO.  MD paged about situation.  @ small bumps that look like big bites under IV site dressing with redness and itching on the outer portion of the rt arm extending up to the elbow.

## 2021-02-26 NOTE — H&P (Signed)
History and Physical  Birdell Frasier Tapanes AOZ:[id number] DOB: May 18, 1975 DOA: 02/26/2021  Referring physician: Accepted by Dr. Olevia Bowens, [facility], hospitalist team. PCP: Lucianne Lei, MD  Outpatient Specialists: Psychiatry/behavioral health Patient coming from: Home through [facility] ED.  Chief Complaint: Sudden onset nausea and vomiting  HPI: Heather [name] is a 45 y.o. female with medical history significant for super morbid obesity, chronic anxiety/depression/PTSD, recurrent UTIs, homelessness, who presented to [facility] ED via EMS from [facility] due to sudden onset episode of nausea and vomiting.  Reports urinary retention over the weekend while at the shelter for the homeless.  Associated with constipation of 3 days duration.  She was discharged from [facility] hospital on 11/18, treated for UTI and discharged on doxycycline to a hotel, she was not able to get a room there.  Presented the same-day to an outside emergency department with chest discomfort, work-up was reassuring and she was discharged from the ED.  From there she went to the shelter as noted above and became acutely ill today.  No recent fevers.  No abdominal pain.  Upon presentation to the ED, work-up revealed urine positive for pyuria and acute urinary retention for which Foley catheter was placed in the ED.  [facility], hospitalist team, was asked to admit.  Patient accepted by Dr. Olevia Bowens, hospitalist service.  ED Course: Afebrile.  BP 127/79, heart rate 101, respiration rate 16.  O2 saturation 90% on room air.  Lab studies remarkable for BUN 27, creatinine 1.46, calcium 10.7, magnesium 2.5, albumin 3.7.  GFR 46.  WBC 13.7.  Review of Systems: Review of systems as noted in the HPI. All other systems reviewed and are negative.   Past Medical History:  Diagnosis Date   Anxiety    Arthritis    Asthma    Back pain    Constipation    COPD (chronic obstructive pulmonary disease) ([place])     Depression    Diabetes mellitus without complication ([place])    patient denies but states she has hyperglycemia-diet controlled   Dysrhythmia    DR [facility]     Ectopic pregnancy 9629   Eosinophilic esophagitis    Diagnosed at [facility] 06/16/2013, untreated   GERD (gastroesophageal reflux disease)    HEARTBURN   TUMS   High cholesterol    IBS (irritable bowel syndrome)    Leukocytosis 07/28/2008   Qualifier: Diagnosis of  By: Jonna Munro MD, Cornelius     Morbid obesity [facility]    Neuromuscular disorder ([place])    RESTLESS LEG    Obesity    Schizoaffective disorder, bipolar type ([place])    Sleep apnea    CPAP- in process of restarting    Past Surgical History:  Procedure Laterality Date   BIOPSY  08/13/2018   Procedure: BIOPSY;  Surgeon: Yetta Flock, MD;  Location: [facility];  Service: Gastroenterology;;   CESAREAN SECTION MULTI-GESTATIONAL N/A 02/03/2017   Procedure: [place];  Surgeon: Jonnie Kind, MD;  Location: [place];  Service: Obstetrics;  Laterality: N/A;   CHOLECYSTECTOMY     COLONOSCOPY WITH PROPOFOL N/A 08/13/2018   Procedure: COLONOSCOPY WITH PROPOFOL;  Surgeon: Yetta Flock, MD;  Location: [facility];  Service: Gastroenterology;  Laterality: N/A;   DENTAL SURGERY     ESOPHAGOGASTRODUODENOSCOPY  May 2007   Dr. Gala Romney: Normal esophagus, stomach, D1, D2   ESOPHAGOGASTRODUODENOSCOPY  06/16/2013   Dr. Carlton Adam, eosinophilic esophagitis, reactive gastropathy, no esophageal dilation   ESOPHAGOGASTRODUODENOSCOPY (EGD) WITH PROPOFOL  N/A 08/13/2018   Procedure: ESOPHAGOGASTRODUODENOSCOPY (EGD) WITH PROPOFOL;  Surgeon: Yetta Flock, MD;  Location: [facility];  Service: Gastroenterology;  Laterality: N/A;   POLYPECTOMY  08/13/2018   Procedure: POLYPECTOMY;  Surgeon: Yetta Flock, MD;  Location: [facility];  Service: Gastroenterology;;   TONSILLECTOMY     TOOTH EXTRACTION  10/28/2011   Procedure: DENTAL  RESTORATION/EXTRACTIONS;  Surgeon: Gae Bon, DDS;  Location: [facility] OR;  Service: Oral Surgery;;   UPPER GASTROINTESTINAL ENDOSCOPY      Social History:  reports that she quit smoking about 9 years ago. Her smoking use included cigarettes. She has a 4.00 pack-year smoking history. She has never used smokeless tobacco. She reports that she does not currently use alcohol. She reports that she does not currently use drugs after having used the following drugs: Marijuana.   Allergies  Allergen Reactions   Amoxicillin Anaphylaxis   Bee Venom Shortness Of Breath and Swelling   Penicillin G Anaphylaxis   Penicillins Anaphylaxis    Tolerates ceftriaxone Has patient had a PCN reaction causing immediate rash, facial/tongue/throat swelling, SOB or lightheadedness with hypotension: No Has patient had a PCN reaction causing severe rash involving mucus membranes or skin necrosis: No Has patient had a PCN reaction that required hospitalization No Has patient had a PCN reaction occurring within the last 10 years: No If all of the above answers are "NO", then may proceed with Cephalosporin use.'  REACTION: Angioedema   Adhesive [Tape] Itching and Rash   Latex Itching and Rash   Vancomycin Other (See Comments)    Pt can tolerate Vancomycin but did cause Vancomycin Infusion Reaction.  Recommend to pre-medicate with Benadryl before doses administered.      Family History  Problem Relation Age of Onset   Depression Mother    Anxiety disorder Mother    High blood pressure Mother    Bipolar disorder Mother    Eating disorder Mother    Obesity Mother    Hypertension Sister    Allergic rhinitis Sister    Colon polyps Maternal Grandmother        41s   Diabetes Maternal Grandmother    Anxiety disorder Maternal Grandmother    COPD Maternal Grandmother    Crohn's disease Maternal Aunt    Cancer Maternal Grandfather        prostate   HIV/AIDS Father    Eating disorder Father    Obesity Father     Liver disease Neg Hx    Angioedema Neg Hx    Eczema Neg Hx    Immunodeficiency Neg Hx    Asthma Neg Hx    Urticaria Neg Hx    Colon cancer Neg Hx    Esophageal cancer Neg Hx    Rectal cancer Neg Hx    Stomach cancer Neg Hx       Prior to Admission medications   Medication Sig Start Date End Date Taking? Authorizing Provider  ascorbic acid (VITAMIN C) 500 MG tablet Take 1 tablet (500 mg total) by mouth daily. 12/27/20   Samella Parr, NP  buPROPion (WELLBUTRIN XL) 300 MG 24 hr tablet Take 1 tablet (300 mg total) by mouth daily. 01/29/21   Patrecia Pour, NP  cephALEXin (KEFLEX) 500 MG capsule Take 1 capsule (500 mg total) by mouth 4 (four) times daily. 02/08/21   Quintella Reichert, MD  clonazePAM (KLONOPIN) 0.5 MG tablet Take 1/2 tablet (0.25 mg total) by mouth daily as needed (panic attacks). 01/04/21   Lissa Merlin,  Leisa Lenz, NP  clotrimazole (LOTRIMIN) 1 % cream Apply topically 2 (two) times daily. 11/02/20   Shelly Coss, MD  diclofenac Sodium (VOLTAREN) 1 % GEL Apply 4 g topically 4 (four) times daily. 12/27/20   Samella Parr, NP  doxycycline (VIBRA-TABS) 100 MG tablet Take 100 mg by mouth 2 (two) times daily. 02/22/21   [provider]  doxycycline (VIBRAMYCIN) 50 MG capsule Take 1 tablet (50 mg total) by mouth at bedtime. 02/15/21     FLUoxetine (PROZAC) 10 MG capsule Take 30 mg by mouth every morning. 02/22/21   [provider]  fluticasone (FLONASE) 50 MCG/ACT nasal spray Place 2 sprays into both nostrils 2 (two) times daily. Patient taking differently: Place 2 sprays into both nostrils 2 (two) times daily as needed for allergies or rhinitis. 12/27/20   Samella Parr, NP  furosemide (LASIX) 40 MG tablet Take 1 tablet (40 mg total) by mouth daily. 01/04/21   Samella Parr, NP  methocarbamol (ROBAXIN) 500 MG tablet Take 1 tablet (500 mg total) by mouth 4 (four) times daily. 12/27/20   Samella Parr, NP  mometasone-formoterol (DULERA) 100-5 MCG/ACT AERO Inhale 2  puffs into the lungs 2 (two) times daily. 12/27/20   Samella Parr, NP  nystatin (MYCOSTATIN/NYSTOP) powder Apply topically 3 (three) times daily. Patient not taking: Reported on 02/16/2021 12/27/20   Samella Parr, NP  ondansetron (ZOFRAN ODT) 4 MG disintegrating tablet Take 1 tablet (4 mg total) by mouth every 8 (eight) hours as needed for nausea or vomiting. 02/08/21   Quintella Reichert, MD  oxyCODONE (OXY IR/ROXICODONE) 5 MG immediate release tablet Take 1 tablet (5 mg total) by mouth every 4 (four) hours as needed for moderate pain. Patient not taking: Reported on 02/16/2021 12/27/20   Samella Parr, NP  potassium chloride SA (KLOR-CON) 20 MEQ tablet Take 1 tablet (20 mEq total) by mouth daily. 01/04/21   Samella Parr, NP  pregabalin (LYRICA) 100 MG capsule Take 1 capsule (100 mg total) by mouth 3 (three) times daily. 12/27/20   Samella Parr, NP  rOPINIRole (REQUIP) 4 MG tablet Take 1 tablet (4 mg total) by mouth at bedtime. 12/27/20   Samella Parr, NP  senna-docusate (SENOKOT-S) 8.6-50 MG tablet Take 2 tablets by mouth daily. 12/27/20   Samella Parr, NP  silver sulfADIAZINE (SILVADENE) 1 % cream Apply topically daily. Patient not taking: No sig reported 01/04/21   Samella Parr, NP  topiramate (TOPAMAX) 25 MG tablet Take 1 tablet (25 mg total) by mouth 2 (two) times daily. 12/27/20   Samella Parr, NP  traZODone (DESYREL) 50 MG tablet Take 1 tablet (50 mg total) by mouth at bedtime. 01/29/21   Patrecia Pour, NP  vitamin B-12 (CYANOCOBALAMIN) 1000 MCG tablet Take 1 tablet (1,000 mcg total) by mouth daily. 12/27/20   Samella Parr, NP  ziprasidone (GEODON) 20 MG capsule Take 1 capsule (20 mg total) by mouth daily. 01/29/21   Patrecia Pour, NP    Physical Exam: BP 127/79 (BP Location: Left Arm)   Pulse (!) 101   Temp 98.7 F (37.1 C) (Oral)   Resp 18   Ht 5\' 3"  (1.6 m)   Wt (!) 190.5 kg   SpO2 100%   BMI 74.40 kg/m   General: 45 y.o. year-old female super  morbid obesity in no acute distress.  Alert and oriented x3. Cardiovascular: Regular rate and rhythm with no rubs or gallops.  No  thyromegaly or JVD noted.  No lower extremity edema. 2/4 pulses in all 4 extremities. Respiratory: Clear to auscultation with no wheezes or rales. Good inspiratory effort. Abdomen: Soft nontender nondistended with normal bowel sounds x4 quadrants. Muskuloskeletal: No cyanosis, clubbing or edema noted bilaterally Neuro: CN II-XII intact, strength, sensation, reflexes Skin: No ulcerative lesions noted or rashes Psychiatry: Judgement and insight appear normal. Mood is appropriate for condition and setting          Labs on Admission:  Basic Metabolic Panel: Recent Labs  Lab 02/26/21 0454  NA 139  K 3.9  CL 103  CO2 24  GLUCOSE 98  BUN 27*  CREATININE 1.46*  CALCIUM 10.7*   Liver Function Tests: No results for input(s): AST, ALT, ALKPHOS, BILITOT, PROT, ALBUMIN in the last 168 hours. No results for input(s): LIPASE, AMYLASE in the last 168 hours. No results for input(s): AMMONIA in the last 168 hours. CBC: Recent Labs  Lab 02/26/21 0544  WBC 13.7*  NEUTROABS 10.8*  HGB 12.0  HCT 40.7  MCV 84.6  PLT 403*   Cardiac Enzymes: No results for input(s): CKTOTAL, CKMB, CKMBINDEX, TROPONINI in the last 168 hours.  BNP (last 3 results) No results for input(s): BNP in the last 8760 hours.  ProBNP (last 3 results) No results for input(s): PROBNP in the last 8760 hours.  CBG: No results for input(s): GLUCAP in the last 168 hours.  Radiological Exams on Admission: CT Renal Stone Study  Result Date: 02/26/2021 CLINICAL DATA:  45 year old female with history of nausea, vomiting and diarrhea with urinary retention. Abdominal pain. EXAM: CT ABDOMEN AND PELVIS WITHOUT CONTRAST TECHNIQUE: Multidetector CT imaging of the abdomen and pelvis was performed following the standard protocol without IV contrast. COMPARISON:  CT the abdomen and pelvis 01/26/2021.  FINDINGS: Lower chest: Unremarkable. Hepatobiliary: Diffuse low attenuation throughout the hepatic parenchyma, indicative of a background of hepatic steatosis. No discrete cystic or solid hepatic lesions are confidently identified on today's noncontrast CT examination. Status post cholecystectomy. Pancreas: No definite pancreatic mass or peripancreatic fluid collections or inflammatory changes are noted on today's noncontrast CT examination. Spleen: Unremarkable. Adrenals/Urinary Tract: Several large calculi are present in both renal collecting systems, largest of which is a staghorn calculus in the lower pole collecting system of the left kidney which measures approximally 2.4 x 5.0 x 4.5 cm and extends into the left renal pelvis nearly to the level of the ureteropelvic junction. Large low-attenuation lesion in the upper pole of the right kidney, incompletely characterized on today's non-contrast CT examination, but similar to the prior study and statistically likely to represent a large cyst, currently measuring 8.3 cm in diameter (unchanged). Mild atrophy and multifocal cortical thinning in the left kidney. There are no calculi identified along the course of either ureter or in the lumen of the urinary bladder. No hydroureteronephrosis. Urinary bladder is completely decompressed with an indwelling Foley balloon catheter, but otherwise unremarkable in appearance. Bilateral adrenal glands are normal in appearance. Stomach/Bowel: Unenhanced appearance of the stomach is normal. There is no pathologic dilatation of small bowel or colon. Normal appendix. Vascular/Lymphatic: No atherosclerotic calcifications are noted in the abdominal aorta or pelvic vasculature. No lymphadenopathy noted in the abdomen or pelvis. Reproductive: Unenhanced appearance of the uterus and ovaries is unremarkable. Other: No significant volume of ascites.  No pneumoperitoneum. Musculoskeletal: There are no aggressive appearing lytic or blastic  lesions noted in the visualized portions of the skeleton. IMPRESSION: 1. Multiple large nonobstructive calculi in the collecting systems  of both kidneys, including a large staghorn calculus in the lower pole collecting system of left kidney, as above. No ureteral stones or findings of urinary tract obstruction are noted at this time. 2. Hepatic steatosis. 3. Additional incidental findings, as above. Electronically Signed   By: Vinnie Langton M.D.   On: 02/26/2021 07:01    EKG: I independently viewed the EKG done and my findings are as followed: None available at the time of this visit.  Assessment/Plan Present on Admission:  Urinary retention  Principal Problem:   Urinary retention  Acute urinary retention Foley catheter placed at [place] ED on 02/26/2021 Add Flomax  Presumed UTI, POA She was recently admitted at [facility] for [facility] same, received IV antibiotics and was discharged 02/23/2021 on doxycycline. Allergic to Rocephin, start ciprofloxacin Follow urine culture  CKD 3B Avoid nephrotoxic agents, dehydration and hypotension Appears to be at her baseline creatinine Closely monitor urine output with strict I's and O's  Chronic anxiety/depression/PTSD Resume home regimen  Ambulatory dysfunction Ambulates with a walker PT OT to assess Fall precautions  Severe morbid obesity BMI 74 Recommend weight loss outpatient with regular physical activity and healthy dieting.   DVT prophylaxis: Subcu Lovenox daily  Code Status: Full code  Family Communication: None at bedside    Disposition Plan: Will likely discharge to shelter  Consults called: None  Admission status: Observation status   Status is: Observation        Kayleen Memos MD [facility] Pager [phone]  If 7PM-7AM, please contact night-coverage [email or web address] Password [facility]  02/26/2021, 5:48 PM

## 2021-02-26 NOTE — ED Notes (Signed)
Patient transported to CT 

## 2021-02-27 ENCOUNTER — Other Ambulatory Visit (HOSPITAL_BASED_OUTPATIENT_CLINIC_OR_DEPARTMENT_OTHER): Payer: Self-pay

## 2021-02-27 DIAGNOSIS — R339 Retention of urine, unspecified: Secondary | ICD-10-CM | POA: Diagnosis not present

## 2021-02-27 LAB — CBC
HCT: 36.4 % (ref 36.0–46.0)
Hemoglobin: 10.8 g/dL — ABNORMAL LOW (ref 12.0–15.0)
MCH: 25.1 pg — ABNORMAL LOW (ref 26.0–34.0)
MCHC: 29.7 g/dL — ABNORMAL LOW (ref 30.0–36.0)
MCV: 84.5 fL (ref 80.0–100.0)
Platelets: UNDETERMINED 10*3/uL (ref 150–400)
RBC: 4.31 MIL/uL (ref 3.87–5.11)
RDW: 16.7 % — ABNORMAL HIGH (ref 11.5–15.5)
WBC: 6.5 10*3/uL (ref 4.0–10.5)
nRBC: 0 % (ref 0.0–0.2)

## 2021-02-27 LAB — BASIC METABOLIC PANEL
Anion gap: 9 (ref 5–15)
BUN: 19 mg/dL (ref 6–20)
CO2: 23 mmol/L (ref 22–32)
Calcium: 9.1 mg/dL (ref 8.9–10.3)
Chloride: 105 mmol/L (ref 98–111)
Creatinine, Ser: 1.14 mg/dL — ABNORMAL HIGH (ref 0.44–1.00)
GFR, Estimated: >60 mL/min (ref >60)
Glucose, Bld: 106 mg/dL — ABNORMAL HIGH (ref 70–99)
Potassium: 3.8 mmol/L (ref 3.5–5.1)
Sodium: 137 mmol/L (ref 135–145)

## 2021-02-27 LAB — CREATININE, SERUM
Creatinine, Ser: 1.02 mg/dL — ABNORMAL HIGH (ref 0.44–1.00)
GFR, Estimated: >60 mL/min (ref >60)

## 2021-02-27 MED ORDER — CIPROFLOXACIN HCL 500 MG PO TABS: 500.0000 mg | ORAL_TABLET | Freq: Two times a day (BID) | ORAL | 0 refills | Status: AC

## 2021-02-27 MED ORDER — CIPROFLOXACIN HCL 500 MG PO TABS: 500.0000 mg | ORAL_TABLET | Freq: Two times a day (BID) | ORAL | 0 refills | Status: DC

## 2021-02-27 MED ORDER — TAMSULOSIN HCL 0.4 MG PO CAPS: 0.4000 mg | ORAL_CAPSULE | Freq: Every day | ORAL | 0 refills | Status: AC

## 2021-02-27 MED ORDER — SENNOSIDES-DOCUSATE SODIUM 8.6-50 MG PO TABS
2.0000 | ORAL_TABLET | Freq: Two times a day (BID) | ORAL | Status: DC
Start: 2021-02-27 — End: 2021-02-28
  Administered 2021-02-28: 2 via ORAL
  Filled 2021-02-27: qty 2

## 2021-02-27 MED ORDER — ONDANSETRON HCL 4 MG/2ML IJ SOLN: 4.0000 mg | Freq: Four times a day (QID) | INTRAMUSCULAR | Status: DC | PRN

## 2021-02-27 MED ORDER — CIPROFLOXACIN HCL 500 MG PO TABS
500.0000 mg | ORAL_TABLET | Freq: Two times a day (BID) | ORAL | Status: DC
  Administered 2021-02-27 – 2021-02-28 (×2): 500 mg via ORAL
  Filled 2021-02-27 (×2): qty 1

## 2021-02-27 NOTE — Progress Notes (Addendum)
PROGRESS NOTE  Heather [name] YTK:[id number] DOB: [birth year] DOA: 02/26/2021 PCP: Lucianne Lei, MD  HPI/Recap of past 24 hours: Heather [name] is a 45 y.o. female with medical history significant for super morbid obesity, chronic anxiety/depression/PTSD, recurrent UTIs, homelessness, who presented to [place] ED via EMS from [facility] for the homeless due to sudden onset episode of nausea and vomiting.  Reports urinary retention over the weekend while at the shelter for the homeless.  Associated with constipation of 3 days duration.  She was discharged from [facility] hospital on 11/18, treated for UTI and discharged on doxycycline to a hotel, she was not able to get a room there.  Presented the same-day to an outside emergency department with chest discomfort, work-up was reassuring and she was discharged from the ED.  From there she went to the shelter as noted above and became acutely ill today.  No recent fevers.  No abdominal pain.  Upon presentation to the ED, work-up revealed urine positive for pyuria and acute urinary retention for which Foley catheter was placed in the ED.  TRH, hospitalist team, was asked to admit.  Patient accepted by Dr. Olevia Bowens, hospitalist service.   ED Course: Afebrile.  BP 127/79, heart rate 101, respiration rate 16.  O2 saturation 90% on room air.  Lab studies remarkable for BUN 27, creatinine 1.46, calcium 10.7, magnesium 2.5, albumin 3.7.  GFR 46.  WBC 13.7.  02/27/2021: Patient was seen and examined at her bedside.  She has no new complaints.  Foley catheter is in place, started on Flomax.  Afebrile with no leukocytosis.  Antibiotics switched to p.o. ciprofloxacin, will be discharged after 8am in order to go to [facility] homeless shelter.  Once she gets to [facility], they will provide her transportation to a hotel for her.  Assessment/Plan: Principal Problem:   Urinary retention  Acute urinary retention s/p foley catheter placement Foley  catheter placed at [place] ED on 02/26/2021 Continue Flomax Voiding trial at urology's office Dr. Reggy Eye, Warrick Parisian at [facility].   Presumed UTI, [place] She was recently admitted at [facility] for the same, received IV antibiotics and was discharged 02/23/2021 on doxycycline. Allergic to Rocephin, start ciprofloxacin Follow urine culture   CKD 3B Continue to avoid nephrotoxic agents, dehydration and hypotension Appears to be at her baseline creatinine Closely monitor urine output with strict I's and O's   Chronic anxiety/depression/PTSD Resume home regimen   Ambulatory dysfunction Ambulates with a walker PT OT to assess Continue fall precautions   Severe morbid obesity BMI 74 Recommend weight loss outpatient.     DVT prophylaxis: Subcu Lovenox daily   Code Status: Full code   Family Communication: None at bedside      Disposition Plan: Will likely discharge to shelter   Consults called: None   Admission status: Observation status     Status is: Observation    Objective: Vitals:   02/26/21 1639 02/26/21 2127 02/27/21 0147 02/27/21 0412  BP: 127/79 109/66 (!) 104/56 111/62  Pulse: (!) 101 89 74 66  Resp: 18 (!) 22 19 13   Temp: 98.7 F (37.1 C) 99.8 F (37.7 C) 98.4 F (36.9 C) 97.7 F (36.5 C)  TempSrc: Oral Oral Oral Axillary  SpO2: 100% 94% 96% 96%  Weight:      Height:        Intake/Output Summary (Last 24 hours) at 02/27/2021 1650 Last data filed at 02/27/2021 1527 Gross per 24 hour  Intake 327.97 ml  Output 1300 ml  Net -972.03 ml   Filed Weights   02/26/21 0403 02/26/21 0654  Weight: (!) 188.7 kg (!) 190.5 kg    Exam:  General: 45 y.o. year-old female severely morbidly obese in no acute distress.  Alert and oriented x3. Cardiovascular: Regular rate and rhythm with no rubs or gallops.  No thyromegaly or JVD noted.   Respiratory: Clear to auscultation with no wheezes or rales. Good inspiratory effort. Abdomen: Soft nontender  nondistended with normal bowel sounds x4 quadrants. Musculoskeletal: No lower extremity edema. 2/4 pulses in all 4 extremities. Skin: No ulcerative lesions noted or rashes, Psychiatry: Mood is appropriate for condition and setting   Data Reviewed: CBC: Recent Labs  Lab 02/26/21 0544 02/27/21 0900  WBC 13.7* 6.5  NEUTROABS 10.8*  --   HGB 12.0 10.8*  HCT 40.7 36.4  MCV 84.6 84.5  PLT 403* PLATELET CLUMPS NOTED ON SMEAR, UNABLE TO ESTIMATE   Basic Metabolic Panel: Recent Labs  Lab 02/26/21 0454 02/27/21 0352 02/27/21 0900  NA 139  --  137  K 3.9  --  3.8  CL 103  --  105  CO2 24  --  23  GLUCOSE 98  --  106*  BUN 27*  --  19  CREATININE 1.46* 1.02* 1.14*  CALCIUM 10.7*  --  9.1   GFR: Estimated Creatinine Clearance: 108.1 mL/min (A) (by C-G formula based on SCr of 1.14 mg/dL (H)). Liver Function Tests: No results for input(s): AST, ALT, ALKPHOS, BILITOT, PROT, ALBUMIN in the last 168 hours. No results for input(s): LIPASE, AMYLASE in the last 168 hours. No results for input(s): AMMONIA in the last 168 hours. Coagulation Profile: No results for input(s): INR, PROTIME in the last 168 hours. Cardiac Enzymes: No results for input(s): CKTOTAL, CKMB, CKMBINDEX, TROPONINI in the last 168 hours. BNP (last 3 results) No results for input(s): PROBNP in the last 8760 hours. HbA1C: No results for input(s): HGBA1C in the last 72 hours. CBG: No results for input(s): GLUCAP in the last 168 hours. Lipid Profile: No results for input(s): CHOL, HDL, LDLCALC, TRIG, CHOLHDL, LDLDIRECT in the last 72 hours. Thyroid Function Tests: No results for input(s): TSH, T4TOTAL, FREET4, T3FREE, THYROIDAB in the last 72 hours. Anemia Panel: No results for input(s): VITAMINB12, FOLATE, FERRITIN, TIBC, IRON, RETICCTPCT in the last 72 hours. Urine analysis:    Component Value Date/Time   COLORURINE YELLOW 02/26/2021 0424   APPEARANCEUR HAZY (A) 02/26/2021 0424   APPEARANCEUR Cloudy (A)  08/06/2016 1209   LABSPEC 1.018 02/26/2021 0424   PHURINE 6.0 02/26/2021 0424   GLUCOSEU NEGATIVE 02/26/2021 0424   HGBUR MODERATE (A) 02/26/2021 0424   HGBUR large 04/19/2008 0952   BILIRUBINUR NEGATIVE 02/26/2021 0424   BILIRUBINUR Negative 08/06/2016 1209   KETONESUR NEGATIVE 02/26/2021 0424   PROTEINUR 100 (A) 02/26/2021 0424   UROBILINOGEN 0.2 12/06/2014 2049   NITRITE NEGATIVE 02/26/2021 0424   LEUKOCYTESUR LARGE (A) 02/26/2021 0424   Sepsis Labs: @LABRCNTIP (procalcitonin:4,lacticidven:4)  ) Recent Results (from the past 240 hour(s))  Urine Culture     Status: None (Preliminary result)   Collection Time: 02/26/21  4:24 AM   Specimen: In/Out Cath Urine  Result Value Ref Range Status   Specimen Description   Final    IN/OUT CATH URINE Performed at [facility], [address], [place], [place] [address]    Special Requests   Final    NONE Performed at [facility], [address] [place], [place] [address]  Culture   Final    CULTURE REINCUBATED FOR BETTER GROWTH Performed at [facility], [place] [address] [place], [place] [address]    Report Status PENDING  Incomplete  Resp Panel by RT-PCR (Flu A&B, Covid) Nasopharyngeal Swab     Status: None   Collection Time: 02/26/21  8:14 AM   Specimen: Nasopharyngeal Swab; Nasopharyngeal(NP) swabs in vial transport medium  Result Value Ref Range Status   SARS Coronavirus 2 by RT PCR NEGATIVE NEGATIVE Final    Comment: (NOTE) SARS-CoV-2 target nucleic acids are NOT DETECTED.  The SARS-CoV-2 RNA is generally detectable in upper respiratory specimens during the acute phase of infection. The lowest concentration of SARS-CoV-2 viral copies this assay can detect is 138 copies/mL. A negative result does not preclude SARS-Cov-2 infection and should not be used as the sole basis for treatment or other patient management decisions. A negative result may occur with  improper specimen  collection/handling, submission of specimen other than nasopharyngeal swab, presence of viral mutation(s) within the areas targeted by this assay, and inadequate number of viral copies(<138 copies/mL). A negative result must be combined with clinical observations, patient history, and epidemiological information. The expected result is Negative.  Fact Sheet for Patients:  EntrepreneurPulse.com.au  Fact Sheet for Healthcare Providers:  IncredibleEmployment.be  This test is no t yet approved or cleared by the [place] FDA and  has been authorized for detection and/or diagnosis of SARS-CoV-2 by FDA under an Emergency Use Authorization (EUA). This EUA will remain  in effect (meaning this test can be used) for the duration of the COVID-19 declaration under Section 564(b)(1) of the Act, 21 U.S.C.section 360bbb-3(b)(1), unless the authorization is terminated  or revoked sooner.       Influenza A by PCR NEGATIVE NEGATIVE Final   Influenza B by PCR NEGATIVE NEGATIVE Final    Comment: (NOTE) The Xpert Xpress SARS-CoV-2/FLU/RSV plus assay is intended as an aid in the diagnosis of influenza from Nasopharyngeal swab specimens and should not be used as a sole basis for treatment. Nasal washings and aspirates are unacceptable for Xpert Xpress SARS-CoV-2/FLU/RSV testing.  Fact Sheet for Patients: EntrepreneurPulse.com.au  Fact Sheet for Healthcare Providers: IncredibleEmployment.be  This test is not yet approved or cleared by the [place] FDA and has been authorized for detection and/or diagnosis of SARS-CoV-2 by FDA under an Emergency Use Authorization (EUA). This EUA will remain in effect (meaning this test can be used) for the duration of the COVID-19 declaration under Section 564(b)(1) of the Act, 21 U.S.C. section 360bbb-3(b)(1), unless the authorization is terminated or revoked.  Performed at [facility], [address], [place],  [address]       Studies: No results found.  Scheduled Meds:  ascorbic acid  500 mg Oral Daily   bisacodyl  10 mg Rectal Once   Chlorhexidine Gluconate Cloth  6 each Topical Daily   ciprofloxacin  500 mg Oral BID   enoxaparin (LOVENOX) injection  80 mg Subcutaneous Q24H   FLUoxetine  30 mg Oral q morning   fluticasone furoate-vilanterol  1 puff Inhalation q morning   furosemide  40 mg Oral Daily   nystatin   Topical TID   pregabalin  100 mg Oral TID   rOPINIRole  4 mg Oral QHS   senna-docusate  2 tablet Oral Daily   silver sulfADIAZINE   Topical Daily   tamsulosin  0.4 mg Oral QPC supper   vitamin B-12  1,000 mcg Oral Daily  Continuous Infusions:     LOS: 0 days     Kayleen Memos, MD [facility] Pager [phone]  If 7PM-7AM, please contact night-coverage [email or web address] Password [facility] 02/27/2021, 4:50 PM

## 2021-02-27 NOTE — TOC Progression Note (Signed)
Transition of Care [facility]) - Progression Note    Patient Details  Name: Heather [name] MRN: [id number] Date of Birth: Jun 06, 1975  Transition of Care [facility]) CM/SW Contact  Ross Ludwig, [place] Phone Number: 02/27/2021, 4:25 PM  Clinical Narrative:    [facility] RN case manager Purcell Mouton, received phone call from Anner Crete and was informed that patient needs to be at [facility] in the morning after 8am, and they will provide transportation and a hotel room for her once she arrives at [facility].  If she does not go tomorrow she may lose the bed.  TOC to continue to follow patient throughout discharge planning.   Expected Discharge Plan: Homeless Shelter Barriers to Discharge: No Barriers Identified  Expected Discharge Plan and Services Expected Discharge Plan: Homeless Shelter       Living arrangements for the past 2 months: Homeless Shelter                                       Social Determinants of Health (SDOH) Interventions    Readmission Risk Interventions Readmission Risk Prevention Plan 12/08/2020  Transportation Screening Complete  Medication Review Press photographer) Complete  PCP or Specialist appointment within 3-5 days of discharge Complete  HRI or [place] Complete  SW Recovery Care/Counseling Consult Complete  [place] Not Applicable  Some recent data might be hidden

## 2021-02-27 NOTE — NC FL2 (Signed)
[employer] LEVEL OF CARE SCREENING TOOL     IDENTIFICATION  Patient Name: Heather [name] Birthdate: July 12, 1975 Sex: female Admission Date (Current Location): 02/26/2021  [facility] and [place] Number:  Herbalist and Address:  [facility],  [place], [place]      Provider Number: [id number]  Attending Physician Name and Address:  Kayleen Memos, DO  Relative Name and Phone Number:  Darrah,Dawn Mother   [phone]    Current Level of Care: Hospital Recommended Level of Care: [place] Prior Approval Number:    Date Approved/Denied:   PASRR Number: [id number]  Discharge Plan: SNF    Current Diagnoses: Patient Active Problem List   Diagnosis Date Noted   Urinary retention 02/26/2021   Homelessness 02/07/2021   Cystitis 01/26/2021   Social problem 01/26/2021   Enterococcus faecalis infection 01/26/2021   Mood disorder ([place]) 01/26/2021   Chronic pain disorder 01/26/2021   Thrombocytosis 10/31/2020   CKD (chronic kidney disease), stage III ([place]) 10/31/2020   Major depressive disorder, recurrent ([place]) 09/13/2020   Insomnia    Anemia of chronic disease    Chronic bilateral low back pain without sciatica    Debility 08/24/2020   SIRS (systemic inflammatory response syndrome) ([place]) 08/18/2020   Pressure injury of skin 08/11/2020   Rhabdomyolysis 08/10/2020   Acute renal failure ([place]) 08/10/2020   PTSD (post-traumatic stress disorder) 05/15/2020   Major depressive disorder, recurrent episode, moderate ([place]) 05/05/2020   Generalized anxiety disorder 05/05/2020   Blood in stool    Gastritis and gastroduodenitis    Benign neoplasm of sigmoid colon    Class 3 severe obesity with serious comorbidity and body mass index (BMI) greater than or equal to 70 in adult ([place]) 06/16/2018   Nexplanon insertion 03/27/2017   Postpartum hypertension 03/05/2017   Perennial allergic rhinitis 11/05/2016   Mild  persistent asthma with acute exacerbation 11/05/2016   Hypoglycemia 11/07/2015   OSA (obstructive sleep apnea) 11/07/2015   DM type 2 (diabetes mellitus, type 2) ([place]) 12/07/2014   Panniculitis 12/06/2014   Cellulitis, abdominal wall 11/11/2014   Abdominal pain 35/36/1443   Eosinophilic esophagitis 15/40/0867   Change in bowel habits 04/28/2013   RLS (restless legs syndrome) 08/08/2011   Adjustment disorder 61/95/0932   DYSMETABOLIC SYNDROME 67/03/4579   Essential hypertension 05/12/2006   Asthma 05/12/2006   OSTEOARTHRITIS 05/12/2006    Orientation RESPIRATION BLADDER Height & Weight     Self, Time, Situation, Place  Normal Continent Weight: (!) 190.5 kg Height:  5\' 3"  (160 cm)  BEHAVIORAL SYMPTOMS/MOOD NEUROLOGICAL BOWEL NUTRITION STATUS      Continent Diet (Regular)  AMBULATORY STATUS COMMUNICATION OF NEEDS Skin   Extensive Assist Verbally Normal                       Personal Care Assistance Level of Assistance  Bathing, Feeding, Dressing Bathing Assistance: Limited assistance Feeding assistance: Independent Dressing Assistance: Limited assistance     Functional Limitations Info  Sight, Hearing, Speech Sight Info: Adequate Hearing Info: Adequate Speech Info: Adequate    SPECIAL CARE FACTORS FREQUENCY  PT (By licensed PT), OT (By licensed OT)     PT Frequency: x5 week OT Frequency: x5 week            Contractures Contractures Info: Not present    Additional Factors Info  Code Status, Allergies, Insulin Sliding Scale Code Status Info: FULL Allergies Info: Amoxicillin, Bee Venom, Keflex (Cephalexin),  Penicillin G, Penicillins, Adhesive (Tape), Latex, Vancomycin   Insulin Sliding Scale Info: See Discharge Summary for sliding scale       Current Medications (02/27/2021):  This is the current hospital active medication list Current Facility-Administered Medications  Medication Dose Route Frequency Provider Last Rate Last Admin   albuterol (VENTOLIN  HFA) 108 (90 Base) MCG/ACT inhaler 1-2 puff  1-2 puff Inhalation Q6H PRN Irene Pap N, DO       ascorbic acid (VITAMIN C) tablet 500 mg  500 mg Oral Daily Hall, Carole N, DO   500 mg at 02/27/21 3664   bisacodyl (DULCOLAX) suppository 10 mg  10 mg Rectal Once Godfrey Pick, MD       Chlorhexidine Gluconate Cloth 2 % PADS 6 each  6 each Topical Daily Kayleen Memos, DO   6 each at 02/27/21 4034   ciprofloxacin (CIPRO) IVPB 400 mg  400 mg Intravenous Q12H Lenis Noon, RPH 200 mL/hr at 02/27/21 1205 400 mg at 02/27/21 1205   enoxaparin (LOVENOX) injection 80 mg  80 mg Subcutaneous Q24H Irene Pap N, DO   80 mg at 02/26/21 2234   FLUoxetine (PROZAC) capsule 30 mg  30 mg Oral q morning Irene Pap N, DO   30 mg at 02/27/21 1203   fluticasone furoate-vilanterol (BREO ELLIPTA) 100-25 MCG/ACT 1 puff  1 puff Inhalation q morning Irene Pap N, DO   1 puff at 02/27/21 7425   furosemide (LASIX) tablet 40 mg  40 mg Oral Daily Irene Pap N, DO   40 mg at 02/27/21 9563   nystatin (MYCOSTATIN/NYSTOP) topical powder   Topical TID Kayleen Memos, DO   Given at 02/27/21 8756   pregabalin (LYRICA) capsule 100 mg  100 mg Oral TID Irene Pap N, DO   100 mg at 02/27/21 4332   rOPINIRole (REQUIP) tablet 4 mg  4 mg Oral QHS Irene Pap N, DO   4 mg at 02/26/21 2233   senna-docusate (Senokot-S) tablet 2 tablet  2 tablet Oral Daily Irene Pap N, DO   2 tablet at 02/26/21 1959   silver sulfADIAZINE (SILVADENE) 1 % cream   Topical Daily Kayleen Memos, DO   Given at 02/27/21 9518   tamsulosin (FLOMAX) capsule 0.4 mg  0.4 mg Oral QPC supper Kayleen Memos, DO   0.4 mg at 02/26/21 2000   vitamin B-12 (CYANOCOBALAMIN) tablet 1,000 mcg  1,000 mcg Oral Daily Irene Pap N, DO   1,000 mcg at 02/27/21 8416     Discharge Medications: Please see discharge summary for a list of discharge medications.  Relevant Imaging Results:  Relevant Lab Results:   Additional Information SS#[id number]  Purcell Mouton, RN

## 2021-02-27 NOTE — Evaluation (Signed)
Physical Therapy Evaluation Patient Details Name: Heather [name] MRN: [id number] DOB: 03-17-1976 Today's Date: 02/27/2021  History of Present Illness  patient is a 45 year old female who presented with nausea and vomitting. patient was dfount to have urinary retention and UTI. Significant PMH: COPD, anxiety/depression/schizoaffective disorder, DM, morbid obesity, chronic pain syndrome, OSA  Clinical Impression  Patient well know to PT. Ambulated x 60' x 2 with Rw. HR 115. Pt admitted with above diagnosis.  Pt currently with functional limitations due to the deficits listed below (see PT Problem List). Pt will benefit from skilled PT to increase their independence and safety with mobility to allow discharge to the venue listed below.          Recommendations for follow up therapy are one component of a multi-disciplinary discharge planning process, led by the attending physician.  Recommendations may be updated based on patient status, additional functional criteria and insurance authorization.  Follow Up Recommendations Skilled nursing-short term rehab (<3 hours/day)    Assistance Recommended at Discharge    Functional Status Assessment Patient has had a recent decline in their functional status and/or demonstrates limited ability to make significant improvements in function in a reasonable and predictable amount of time  Equipment Recommendations  None recommended by PT    Recommendations for Other Services       Precautions / Restrictions Precautions Precaution Comments: foley      Mobility  Bed Mobility               General bed mobility comments: in recliner    Transfers   Equipment used: Rolling walker (2 wheels) Transfers: Sit to/from Stand Sit to Stand: Supervision                Ambulation/Gait Ambulation/Gait assistance: Min guard Gait Distance (Feet): 60 Feet (x 2) Assistive device: Rolling walker (2 wheels) Gait Pattern/deviations:  Step-through pattern       General Gait Details: slow and steady  Science writer    Modified Rankin (Stroke Patients Only)       Balance                                             Pertinent Vitals/Pain Pain Assessment: No/denies pain    Home Living Family/patient expects to be discharged to:: Shelter/Homeless                   Additional Comments: Pt reports she has been living in [place] since she lost her apartment last spring    Prior Function Prior Level of Function : Independent/Modified Independent             Mobility Comments: pt reports she ambulated with RW       Hand Dominance   Dominant Hand: Left    Extremity/Trunk Assessment   Upper Extremity Assessment Upper Extremity Assessment: Overall WFL for tasks assessed    Lower Extremity Assessment Lower Extremity Assessment: Overall WFL for tasks assessed    Cervical / Trunk Assessment Cervical / Trunk Exceptions: obesity  Communication   Communication: No difficulties  Cognition Arousal/Alertness: Awake/alert Behavior During Therapy: WFL for tasks assessed/performed Overall Cognitive Status: Within Functional Limits for tasks assessed  General Comments      Exercises     Assessment/Plan    PT Assessment Patient needs continued PT services  PT Problem List Decreased strength;Obesity;Decreased mobility;Decreased activity tolerance       PT Treatment Interventions DME instruction;Therapeutic activities;Gait training;Therapeutic exercise;Patient/family education;Functional mobility training    PT Goals (Current goals can be found in the Care Plan section)  Acute Rehab PT Goals Patient Stated Goal: go to a rehab then a new home for homeless PT Goal Formulation: With patient Time For Goal Achievement: 03/13/21 Potential to Achieve Goals: Good    Frequency Min 2X/week    Barriers to discharge        Co-evaluation               AM-PAC PT "6 Clicks" Mobility  Outcome Measure Help needed turning from your back to your side while in a flat bed without using bedrails?: A Little Help needed moving from lying on your back to sitting on the side of a flat bed without using bedrails?: A Little Help needed moving to and from a bed to a chair (including a wheelchair)?: A Little Help needed standing up from a chair using your arms (e.g., wheelchair or bedside chair)?: A Little Help needed to walk in hospital room?: A Little Help needed climbing 3-5 steps with a railing? : A Lot 6 Click Score: 17    End of Session Equipment Utilized During Treatment: Gait belt Activity Tolerance: Patient tolerated treatment well Patient left: in chair;with call bell/phone within reach Nurse Communication: Mobility status PT Visit Diagnosis: Unsteadiness on feet (R26.81);Difficulty in walking, not elsewhere classified (R26.2)    Time: 5462-7035 PT Time Calculation (min) (ACUTE ONLY): 20 min   Charges:   PT Evaluation $PT Eval Low Complexity: [place] PT Acute Rehabilitation Services Pager [phone] Office [phone]   Claretha Cooper 02/27/2021, 4:07 PM

## 2021-02-27 NOTE — TOC Progression Note (Signed)
Transition of Care [facility]) - Progression Note    Patient Details  Name: Heather [name] MRN: [id number] Date of Birth: 12-04-1975  [facility]) CM/SW Contact  Purcell Mouton, RN Phone Number: 02/27/2021, 12:16 PM  Clinical Narrative:     Spoke with pt concerning Shelter. Pt was thinking that the hospital would find her a place to live. Explained to her that we did not have the resources for housing. Pt states that she was told to call [facility] to help her find a place to live. Pt had contact person's number and was notice to call on her cell phone.     Expected Discharge Plan: Homeless Shelter Barriers to Discharge: No Barriers Identified  Expected Discharge Plan and Services Expected Discharge Plan: Homeless Shelter       Living arrangements for the past 2 months: Homeless Shelter                                       Social Determinants of Health (SDOH) Interventions    Readmission Risk Interventions Readmission Risk Prevention Plan 12/08/2020  Transportation Screening Complete  Medication Review Press photographer) Complete  PCP or Specialist appointment within 3-5 days of discharge Complete  HRI or [place] Complete  SW Recovery Care/Counseling Consult Complete  [place] Not Applicable  Some recent data might be hidden

## 2021-02-27 NOTE — Discharge Summary (Signed)
Discharge Summary  Heather [name] OMV:[id number] DOB: June 01, 1975  PCP: Lucianne Lei, MD  Admit date: 02/26/2021 Discharge date: 02/27/2021  Time spent: 35 minutes  Recommendations for Outpatient Follow-up:  Follow-up with your urologist within a week Follow-up with your primary care provider in 1 to 2 weeks  Discharge Diagnoses:  Active Hospital Problems   Diagnosis Date Noted   Urinary retention 02/26/2021    Resolved Hospital Problems  No resolved problems to display.    Discharge Condition: Stable  Diet recommendation: Resume previous diet.  Vitals:   02/27/21 0147 02/27/21 0412  BP: (!) 104/56 111/62  Pulse: 74 66  Resp: 19 13  Temp: 98.4 F (36.9 C) 97.7 F (36.5 C)  SpO2: 96% 96%    History of present illness:   Heather [name] is a 45 y.o. female with medical history significant for super morbid obesity, chronic anxiety/depression/PTSD, recurrent UTIs, homelessness, who presented to [place] ED via EMS from [facility] for [facility] due to sudden onset episode of nausea and vomiting.  Reports urinary retention over the weekend while at the shelter for the homeless.  Associated with constipation of 3 days duration.  She was discharged from [facility] hospital on 11/18, treated for UTI and discharged on doxycycline to a hotel, she was not able to get a room there.  Presented the same-day to an outside emergency department with chest discomfort, work-up was reassuring and she was discharged from the ED.  From there she went to the shelter as noted above and became acutely ill today.  No recent fevers.  No abdominal pain.  Upon presentation to the ED, work-up revealed urine positive for pyuria and acute urinary retention for which Foley catheter was placed in the ED.  [facility], hospitalist team, was asked to admit.  Patient accepted by Dr. Olevia Bowens, hospitalist service.   ED Course: Afebrile.  BP 127/79, heart rate 101, respiration rate 16.  O2  saturation 90% on room air.  Lab studies remarkable for BUN 27, creatinine 1.46, calcium 10.7, magnesium 2.5, albumin 3.7.  GFR 46.  WBC 13.7.   02/27/2021: Patient was seen and examined at her bedside.  She has no new complaints.  Foley catheter is in place, started on Flomax.  Afebrile with no leukocytosis.  Antibiotics switched to p.o. ciprofloxacin, will be discharged after 8am in order to go to [facility] homeless shelter.  Once she gets to [facility], they will provide her transportation to a hotel for her.  Hospital Course:  Principal Problem:   Urinary retention  Acute urinary retention s/p foley catheter placement Foley catheter placed at [place] ED on 02/26/2021 Continue Flomax Voiding trial at urology's office Dr. Reggy Eye, Warrick Parisian at [facility], call to make an appointment.   Presumed UTI, POA She was recently admitted at [facility] for the same, received IV antibiotics and was discharged 02/23/2021 on doxycycline. Allergic to Rocephin, started IV ciprofloxacin x2 days. Continue p.o. ciprofloxacin 500 mg twice daily x7 days.   CKD 3B Avoid nephrotoxic agents, dehydration and hypotension   Chronic anxiety/depression/PTSD Continue home regimen   Ambulatory dysfunction Ambulates with a walker Continue fall precautions   Severe morbid obesity BMI 74 Recommend weight loss outpatient. Follow-up with your PCP      Code Status: Full code   Family Communication: None at bedside      Disposition Plan: Will likely discharge to shelter      Procedures: Foley catheter placed at [facility] ED.  Consultations: None  Discharge Exam: BP 111/62 (BP Location: Left Arm)   Pulse 66   Temp 97.7 F (36.5 C) (Axillary)   Resp 13   Ht 5\' 3"  (1.6 m)   Wt (!) 190.5 kg   SpO2 96%   BMI 74.40 kg/m  General: 45 y.o. year-old female well developed well nourished in no acute distress.  Alert and oriented x3. Cardiovascular: Regular rate and rhythm with no rubs or gallops.  No  thyromegaly or JVD noted.   Respiratory: Clear to auscultation with no wheezes or rales. Good inspiratory effort. Abdomen: Soft nontender nondistended with normal bowel sounds x4 quadrants. Musculoskeletal: No lower extremity edema. 2/4 pulses in all 4 extremities. Skin: No ulcerative lesions noted or rashes, Psychiatry: Mood is appropriate for condition and setting  Discharge Instructions You were cared for by a hospitalist during your hospital stay. If you have any questions about your discharge medications or the care you received while you were in the hospital after you are discharged, you can call the unit and asked to speak with the hospitalist on call if the hospitalist that took care of you is not available. Once you are discharged, your primary care physician will handle any further medical issues. Please note that NO REFILLS for any discharge medications will be authorized once you are discharged, as it is imperative that you return to your primary care physician (or establish a relationship with a primary care physician if you do not have one) for your aftercare needs so that they can reassess your need for medications and monitor your lab values.   Allergies as of 02/27/2021       Reactions   Amoxicillin Anaphylaxis   Bee Venom Shortness Of Breath, Swelling   Keflex [cephalexin] Anaphylaxis   Penicillin G Anaphylaxis   Penicillins Anaphylaxis   Tolerates ceftriaxone Has patient had a PCN reaction causing immediate rash, facial/tongue/throat swelling, SOB or lightheadedness with hypotension: No Has patient had a PCN reaction causing severe rash involving mucus membranes or skin necrosis: No Has patient had a PCN reaction that required hospitalization No Has patient had a PCN reaction occurring within the last 10 years: No If all of the above answers are "NO", then may proceed with Cephalosporin use.' REACTION: Angioedema   Adhesive [tape] Itching, Rash   Latex Itching, Rash    Vancomycin Other (See Comments)   Pt can tolerate Vancomycin but did cause Vancomycin Infusion Reaction.  Recommend to pre-medicate with Benadryl before doses administered.          Medication List     STOP taking these medications    buPROPion 300 MG 24 hr tablet Commonly known as: WELLBUTRIN XL   cephALEXin 500 MG capsule Commonly known as: KEFLEX   clonazePAM 0.5 MG tablet Commonly known as: KLONOPIN   clotrimazole 1 % cream Commonly known as: LOTRIMIN   doxycycline 100 MG tablet Commonly known as: VIBRA-TABS   doxycycline 50 MG capsule Commonly known as: VIBRAMYCIN   Dulera 100-5 MCG/ACT Aero Generic drug: mometasone-formoterol   oxyCODONE 5 MG immediate release tablet Commonly known as: Oxy IR/ROXICODONE   topiramate 25 MG tablet Commonly known as: TOPAMAX   traZODone 50 MG tablet Commonly known as: DESYREL   ziprasidone 20 MG capsule Commonly known as: GEODON       TAKE these medications    albuterol 108 (90 Base) MCG/ACT inhaler Commonly known as: VENTOLIN HFA Inhale into the lungs every 6 (six) hours as needed for wheezing or shortness of breath.  Breo Ellipta 100-25 MCG/ACT Aepb Generic drug: fluticasone furoate-vilanterol Inhale 1 puff into the lungs every morning.   ciprofloxacin 500 MG tablet Commonly known as: CIPRO Take 1 tablet (500 mg total) by mouth 2 (two) times daily for 7 days.   diclofenac Sodium 1 % Gel Commonly known as: VOLTAREN Apply 4 g topically 4 (four) times daily. What changed: when to take this   FLUoxetine 10 MG capsule Commonly known as: PROZAC Take 30 mg by mouth every morning.   fluticasone 50 MCG/ACT nasal spray Commonly known as: FLONASE Place 2 sprays into both nostrils 2 (two) times daily. What changed:  when to take this reasons to take this   furosemide 40 MG tablet Commonly known as: LASIX Take 1 tablet (40 mg total) by mouth daily.   Melatonin 10 MG Tabs Take 10 mg by mouth daily as needed  (sleep).   methocarbamol 500 MG tablet Commonly known as: ROBAXIN Take 1 tablet (500 mg total) by mouth 4 (four) times daily.   nystatin powder Commonly known as: MYCOSTATIN/NYSTOP Apply topically 3 (three) times daily. What changed:  how much to take when to take this reasons to take this   ondansetron 4 MG disintegrating tablet Commonly known as: Zofran ODT Take 1 tablet (4 mg total) by mouth every 8 (eight) hours as needed for nausea or vomiting.   potassium chloride SA 20 MEQ tablet Commonly known as: KLOR-CON Take 1 tablet (20 mEq total) by mouth daily.   pregabalin 100 MG capsule Commonly known as: LYRICA Take 1 capsule (100 mg total) by mouth 3 (three) times daily.   rOPINIRole 4 MG tablet Commonly known as: REQUIP Take 1 tablet (4 mg total) by mouth at bedtime.   Senexon-S 8.6-50 MG tablet Generic drug: senna-docusate Take 2 tablets by mouth daily. What changed:  when to take this reasons to take this   SSD 1 % cream Generic drug: silver sulfADIAZINE Apply topically daily. What changed:  how much to take when to take this reasons to take this   tamsulosin 0.4 MG Caps capsule Commonly known as: FLOMAX Take 1 capsule (0.4 mg total) by mouth daily after supper. Start taking on: February 28, 2021   vitamin B-12 1000 MCG tablet Commonly known as: CYANOCOBALAMIN Take 1 tablet (1,000 mcg total) by mouth daily.   vitamin C 500 MG tablet Commonly known as: ASCORBIC ACID Take 1 tablet (500 mg total) by mouth daily.       Allergies  Allergen Reactions   Amoxicillin Anaphylaxis   Bee Venom Shortness Of Breath and Swelling   Keflex [Cephalexin] Anaphylaxis   Penicillin G Anaphylaxis   Penicillins Anaphylaxis    Tolerates ceftriaxone Has patient had a PCN reaction causing immediate rash, facial/tongue/throat swelling, SOB or lightheadedness with hypotension: No Has patient had a PCN reaction causing severe rash involving mucus membranes or skin  necrosis: No Has patient had a PCN reaction that required hospitalization No Has patient had a PCN reaction occurring within the last 10 years: No If all of the above answers are "NO", then may proceed with Cephalosporin use.'  REACTION: Angioedema   Adhesive [Tape] Itching and Rash   Latex Itching and Rash   Vancomycin Other (See Comments)    Pt can tolerate Vancomycin but did cause Vancomycin Infusion Reaction.  Recommend to pre-medicate with Benadryl before doses administered.      Follow-up Information     Roxanne Mins, MD. Call in 1 day(s).   Specialty: Urology Why: Please call  for a post hospital follow-up appointment and for a voiding trial. Contact information: [place] [address] [phone]         Lucianne Lei, MD. Call in 1 day(s).   Specialty: Family Medicine Why: Please call for a posthospital follow-up appointment. Contact information: [address] [place] [address] [phone]                  The results of significant diagnostics from this hospitalization (including imaging, microbiology, ancillary and laboratory) are listed below for reference.    Significant Diagnostic Studies: DG Chest 2 View  Result Date: 02/08/2021 CLINICAL DATA:  Chest pain and cough. EXAM: CHEST - 2 VIEW COMPARISON:  January 25, 2021 FINDINGS: The heart size and mediastinal contours are within normal limits. Both lungs are clear. A small radiopaque foreign body is seen overlying the superior mediastinum, just below the level of the clavicles on the frontal view. This may be consistent with a pendant noted within this region on the lateral image. The visualized skeletal structures are unremarkable. IMPRESSION: No active cardiopulmonary disease. Electronically Signed   By: Virgina Norfolk M.D.   On: 02/08/2021 00:23   DG Knee Complete 4 Views Left  Result Date: 02/16/2021 CLINICAL DATA:  Fall, left knee pain and weakness. EXAM: LEFT KNEE -  COMPLETE 4+ VIEW COMPARISON:  08/10/2020. FINDINGS: No acute fracture or dislocation is seen. There is a bony fragment along the lateral aspect of the patellar border which is unchanged from the prior exam. The patella is high riding. Moderate to severe joint space narrowing, subchondral sclerosis, and osteophyte formation is noted at all 3 compartments. There is no obvious joint effusion. No focal soft tissue abnormality is identified. IMPRESSION: 1. No acute fracture or dislocation. 2. Moderate to severe tricompartmental degenerative changes. Electronically Signed   By: Brett Fairy M.D.   On: 02/16/2021 00:35   CT Renal Stone Study  Result Date: 02/26/2021 CLINICAL DATA:  45 year old female with history of nausea, vomiting and diarrhea with urinary retention. Abdominal pain. EXAM: CT ABDOMEN AND PELVIS WITHOUT CONTRAST TECHNIQUE: Multidetector CT imaging of the abdomen and pelvis was performed following the standard protocol without IV contrast. COMPARISON:  CT the abdomen and pelvis 01/26/2021. FINDINGS: Lower chest: Unremarkable. Hepatobiliary: Diffuse low attenuation throughout the hepatic parenchyma, indicative of a background of hepatic steatosis. No discrete cystic or solid hepatic lesions are confidently identified on today's noncontrast CT examination. Status post cholecystectomy. Pancreas: No definite pancreatic mass or peripancreatic fluid collections or inflammatory changes are noted on today's noncontrast CT examination. Spleen: Unremarkable. Adrenals/Urinary Tract: Several large calculi are present in both renal collecting systems, largest of which is a staghorn calculus in the lower pole collecting system of the left kidney which measures approximally 2.4 x 5.0 x 4.5 cm and extends into the left renal pelvis nearly to the level of the ureteropelvic junction. Large low-attenuation lesion in the upper pole of the right kidney, incompletely characterized on today's non-contrast CT examination,  but similar to the prior study and statistically likely to represent a large cyst, currently measuring 8.3 cm in diameter (unchanged). Mild atrophy and multifocal cortical thinning in the left kidney. There are no calculi identified along the course of either ureter or in the lumen of the urinary bladder. No hydroureteronephrosis. Urinary bladder is completely decompressed with an indwelling Foley balloon catheter, but otherwise unremarkable in appearance. Bilateral adrenal glands are normal in appearance. Stomach/Bowel: Unenhanced appearance of the stomach is normal.  There is no pathologic dilatation of small bowel or colon. Normal appendix. Vascular/Lymphatic: No atherosclerotic calcifications are noted in the abdominal aorta or pelvic vasculature. No lymphadenopathy noted in the abdomen or pelvis. Reproductive: Unenhanced appearance of the uterus and ovaries is unremarkable. Other: No significant volume of ascites.  No pneumoperitoneum. Musculoskeletal: There are no aggressive appearing lytic or blastic lesions noted in the visualized portions of the skeleton. IMPRESSION: 1. Multiple large nonobstructive calculi in the collecting systems of both kidneys, including a large staghorn calculus in the lower pole collecting system of left kidney, as above. No ureteral stones or findings of urinary tract obstruction are noted at this time. 2. Hepatic steatosis. 3. Additional incidental findings, as above. Electronically Signed   By: Vinnie Langton M.D.   On: 02/26/2021 07:01    Microbiology: Recent Results (from the past 240 hour(s))  Urine Culture     Status: None (Preliminary result)   Collection Time: 02/26/21  4:24 AM   Specimen: In/Out Cath Urine  Result Value Ref Range Status   Specimen Description   Final    IN/OUT CATH URINE Performed at [facility], [address] [place], [place] [address]    Special Requests   Final    NONE Performed at [facility], [address], [place], [place] [address]    Culture   Final    CULTURE REINCUBATED FOR BETTER GROWTH Performed at [facility], [place] [address], [place], [place] [address]    Report Status PENDING  Incomplete  Resp Panel by RT-PCR (Flu A&B, Covid) Nasopharyngeal Swab     Status: None   Collection Time: 02/26/21  8:14 AM   Specimen: Nasopharyngeal Swab; Nasopharyngeal(NP) swabs in vial transport medium  Result Value Ref Range Status   SARS Coronavirus 2 by RT PCR NEGATIVE NEGATIVE Final    Comment: (NOTE) SARS-CoV-2 target nucleic acids are NOT DETECTED.  The SARS-CoV-2 RNA is generally detectable in upper respiratory specimens during the acute phase of infection. The lowest concentration of SARS-CoV-2 viral copies this assay can detect is 138 copies/mL. A negative result does not preclude SARS-Cov-2 infection and should not be used as the sole basis for treatment or other patient management decisions. A negative result may occur with  improper specimen collection/handling, submission of specimen other than nasopharyngeal swab, presence of viral mutation(s) within the areas targeted by this assay, and inadequate number of viral copies(<138 copies/mL). A negative result must be combined with clinical observations, patient history, and epidemiological information. The expected result is Negative.  Fact Sheet for Patients:  EntrepreneurPulse.com.au  Fact Sheet for Healthcare Providers:  IncredibleEmployment.be  This test is no t yet approved or cleared by the [place] FDA and  has been authorized for detection and/or diagnosis of SARS-CoV-2 by FDA under an Emergency Use Authorization (EUA). This EUA will remain  in effect (meaning this test can be used) for the duration of the COVID-19 declaration under Section 564(b)(1) of the Act, 21 U.S.C.section 360bbb-3(b)(1), unless the authorization is terminated  or revoked sooner.        Influenza A by PCR NEGATIVE NEGATIVE Final   Influenza B by PCR NEGATIVE NEGATIVE Final    Comment: (NOTE) The Xpert Xpress SARS-CoV-2/FLU/RSV plus assay is intended as an aid in the diagnosis of influenza from Nasopharyngeal swab specimens and should not be used as a sole basis for treatment. Nasal washings and aspirates are unacceptable for Xpert Xpress SARS-CoV-2/FLU/RSV testing.  Fact Sheet for Patients: EntrepreneurPulse.com.au  Fact Sheet for Healthcare Providers: IncredibleEmployment.be  This test is not yet approved or cleared by the [place] FDA and has been authorized for detection and/or diagnosis of SARS-CoV-2 by FDA under an Emergency Use Authorization (EUA). This EUA will remain in effect (meaning this test can be used) for the duration of the COVID-19 declaration under Section 564(b)(1) of the Act, 21 U.S.C. section 360bbb-3(b)(1), unless the authorization is terminated or revoked.  Performed at [facility], [address], [place], [place] [address]      Labs: Basic Metabolic Panel: Recent Labs  Lab 02/26/21 0454 02/27/21 0352 02/27/21 0900  NA 139  --  137  K 3.9  --  3.8  CL 103  --  105  CO2 24  --  23  GLUCOSE 98  --  106*  BUN 27*  --  19  CREATININE 1.46* 1.02* 1.14*  CALCIUM 10.7*  --  9.1   Liver Function Tests: No results for input(s): AST, ALT, ALKPHOS, BILITOT, PROT, ALBUMIN in the last 168 hours. No results for input(s): LIPASE, AMYLASE in the last 168 hours. No results for input(s): AMMONIA in the last 168 hours. CBC: Recent Labs  Lab 02/26/21 0544 02/27/21 0900  WBC 13.7* 6.5  NEUTROABS 10.8*  --   HGB 12.0 10.8*  HCT 40.7 36.4  MCV 84.6 84.5  PLT 403* PLATELET CLUMPS NOTED ON SMEAR, UNABLE TO ESTIMATE   Cardiac Enzymes: No results for input(s): CKTOTAL, CKMB, CKMBINDEX, TROPONINI in the last 168 hours. BNP: BNP (last 3 results) No results for input(s): BNP in the  last 8760 hours.  ProBNP (last 3 results) No results for input(s): PROBNP in the last 8760 hours.  CBG: No results for input(s): GLUCAP in the last 168 hours.     Signed:  Kayleen Memos, MD [facility] 02/27/2021, 4:56 PM

## 2021-02-27 NOTE — Evaluation (Signed)
Occupational Therapy Evaluation Patient Details Name: Heather [name] MRN: [id number] DOB: September 10, 1975 Today's Date: 02/27/2021   History of Present Illness patient is a 45 year old female who presented with nausea and vomitting. patient was dfount to have urinary retention and UTI. Significant PMH: COPD, anxiety/depression/schizoaffective disorder, DM, morbid obesity, chronic pain syndrome, OSA   Clinical Impression   Patient is a 45 year old female who reported being homeless with independence in ADLs with AE prior to admission. Patient reported having lost AE during multiple moves to different hotels/shelter. Patient was noted to need mod A for LB dressing/bathing tasks with noted difficulty with reaching these areas anatomically. Patient would benefit from LTC placement or ALF at time of d/c. Patient would continue to benefit from skilled OT services at this time while admitted and after d/c to address noted deficits in order to improve overall safety and independence in ADLs.        Recommendations for follow up therapy are one component of a multi-disciplinary discharge planning process, led by the attending physician.  Recommendations may be updated based on patient status, additional functional criteria and insurance authorization.   Follow Up Recommendations  Home health OT    Assistance Recommended at Discharge Intermittent Supervision/Assistance  Functional Status Assessment  Patient has had a recent decline in their functional status and demonstrates the ability to make significant improvements in function in a reasonable and predictable amount of time.  Equipment Recommendations  Other (comment) (total hip kit)    Recommendations for Other Services       Precautions / Restrictions Precautions Precaution Comments: foley Restrictions Weight Bearing Restrictions: No      Mobility Bed Mobility Overal bed mobility: Needs Assistance Bed Mobility: Supine to Sit      Supine to sit: Min guard     General bed mobility comments: min guard due to height of bed and patients height.    Transfers Overall transfer level: Needs assistance Equipment used: Rolling walker (2 wheels) Transfers: Bed to chair/wheelchair/BSC   Stand pivot transfers: Min guard                Balance Overall balance assessment: Mild deficits observed, not formally tested                                         ADL either performed or assessed with clinical judgement   ADL Overall ADL's : Needs assistance/impaired Eating/Feeding: Set up;Sitting   Grooming: Wash/dry face;Oral care;Sitting;Set up   Upper Body Bathing: Set up;Sitting   Lower Body Bathing: Moderate assistance;Sitting/lateral leans;Sit to/from stand Lower Body Bathing Details (indicate cue type and reason): patient has hard time reaching posteriorly. reports having AE previously but has since lost them Upper Body Dressing : Sitting;Set up   Lower Body Dressing: Sit to/from stand;Sitting/lateral leans;Moderate assistance Lower Body Dressing Details (indicate cue type and reason): see LB bathing comment Toilet Transfer: Min guard;BSC/3in1;Ambulation Toilet Transfer Details (indicate cue type and reason): patient tranfered from edge of bed to bench in room and then to recliner with rolling walker and min guard with educaiton to keep RW Muecke to her. Toileting- Clothing Manipulation and Hygiene: Moderate assistance;Sit to/from stand;Sitting/lateral lean               Vision Patient Visual Report: No change from baseline;Blurring of vision (sometimes. not currently.)       Perception  Praxis      Pertinent Vitals/Pain Pain Assessment: No/denies pain     Hand Dominance Left   Extremity/Trunk Assessment Upper Extremity Assessment Upper Extremity Assessment: Overall WFL for tasks assessed   Lower Extremity Assessment Lower Extremity Assessment: Defer to PT evaluation    Cervical / Trunk Assessment Cervical / Trunk Assessment: Other exceptions Cervical / Trunk Exceptions: obesity   Communication Communication Communication: No difficulties   Cognition Arousal/Alertness: Awake/alert Behavior During Therapy: WFL for tasks assessed/performed Overall Cognitive Status: Within Functional Limits for tasks assessed                                       General Comments       Exercises     Shoulder Instructions      Home Living Family/patient expects to be discharged to:: Shelter/Homeless                                        Prior Functioning/Environment Prior Level of Function : Independent/Modified Independent             Mobility Comments: pt reports she ambulated with RW ADLs Comments: patient reported using AE for ADL tasks.patient reported sititng down to complete toileting hygiene tasks with challange to get backside. patient reported that reacher was left at [facility] by mistake.        OT Problem List: Obesity;Decreased activity tolerance;Impaired balance (sitting and/or standing);Decreased safety awareness;Decreased knowledge of use of DME or AE      OT Treatment/Interventions: Self-care/ADL training;Therapeutic exercise;Neuromuscular education;DME and/or AE instruction;Therapeutic activities;Balance training;Patient/family education    OT Goals(Current goals can be found in the care plan section) Acute Rehab OT Goals Patient Stated Goal: to get out of hospital OT Goal Formulation: With patient Time For Goal Achievement: 03/13/21 Potential to Achieve Goals: Good  OT Frequency: Min 2X/week   Barriers to D/C:    patient is homeless with recurrent UTIs       Co-evaluation              AM-PAC OT "6 Clicks" Daily Activity     Outcome Measure Help from another person eating meals?: None Help from another person taking care of personal grooming?: A Little Help from another person toileting,  which includes using toliet, bedpan, or urinal?: A Lot Help from another person bathing (including washing, rinsing, drying)?: A Lot Help from another person to put on and taking off regular upper body clothing?: A Little Help from another person to put on and taking off regular lower body clothing?: A Lot 6 Click Score: 16   End of Session Equipment Utilized During Treatment: Gait belt;Rolling walker (2 wheels)  Activity Tolerance: Patient tolerated treatment well Patient left: in chair;with call bell/phone within reach  OT Visit Diagnosis: Unsteadiness on feet (R26.81);Muscle weakness (generalized) (M62.81)                Time: 7341-9379 OT Time Calculation (min): 39 min Charges:  OT General Charges $OT Visit: 1 Visit OT Evaluation $OT Eval Low Complexity: 1 Low OT Treatments $Self Care/Home Management : 23-37 mins  Jackelyn Poling OTR/L, MS Acute Rehabilitation Department Office# [phone] Pager# [phone]   Marcellina Millin 02/27/2021, 9:03 AM

## 2021-02-28 DIAGNOSIS — R339 Retention of urine, unspecified: Secondary | ICD-10-CM | POA: Diagnosis not present

## 2021-02-28 LAB — CBC
HCT: 33.4 % — ABNORMAL LOW (ref 36.0–46.0)
Hemoglobin: 9.8 g/dL — ABNORMAL LOW (ref 12.0–15.0)
MCH: 25.3 pg — ABNORMAL LOW (ref 26.0–34.0)
MCHC: 29.3 g/dL — ABNORMAL LOW (ref 30.0–36.0)
MCV: 86.3 fL (ref 80.0–100.0)
Platelets: 320 10*3/uL (ref 150–400)
RBC: 3.87 MIL/uL (ref 3.87–5.11)
RDW: 16.8 % — ABNORMAL HIGH (ref 11.5–15.5)
WBC: 7.9 10*3/uL (ref 4.0–10.5)
nRBC: 0 % (ref 0.0–0.2)

## 2021-02-28 LAB — BASIC METABOLIC PANEL
Anion gap: 8 (ref 5–15)
BUN: 18 mg/dL (ref 6–20)
CO2: 25 mmol/L (ref 22–32)
Calcium: 9.2 mg/dL (ref 8.9–10.3)
Chloride: 105 mmol/L (ref 98–111)
Creatinine, Ser: 1 mg/dL (ref 0.44–1.00)
GFR, Estimated: >60 mL/min (ref >60)
Glucose, Bld: 89 mg/dL (ref 70–99)
Potassium: 3.4 mmol/L — ABNORMAL LOW (ref 3.5–5.1)
Sodium: 138 mmol/L (ref 135–145)

## 2021-02-28 MED ORDER — POTASSIUM CHLORIDE CRYS ER 20 MEQ PO TBCR
40.0000 meq | EXTENDED_RELEASE_TABLET | Freq: Once | ORAL | Status: AC
  Administered 2021-02-28: 40 meq via ORAL
  Filled 2021-02-28: qty 2

## 2021-02-28 NOTE — Progress Notes (Signed)
AVS given to pt with 2 printed Rx. Pt preferred to keep the Foley standard bag instead of leg bag. Pt verbalized understanding about Foley care and Urology follow up.

## 2021-02-28 NOTE — Discharge Summary (Signed)
Discharge Summary  Heather [name] [id number] DOB: 11-01-1975  PCP: Lucianne Lei, MD  Admit date: 02/26/2021 Discharge date: 02/28/2021  Time spent: 35 minutes  Recommendations for Outpatient Follow-up:  Follow-up with your urologist within a week Follow-up with your primary care provider in 1 to 2 weeks  Discharge Diagnoses:  Active Hospital Problems   Diagnosis Date Noted   Urinary retention 02/26/2021    Resolved Hospital Problems  No resolved problems to display.    Discharge Condition: Stable  Diet recommendation: Resume previous diet.  Vitals:   02/28/21 0508 02/28/21 0826  BP: (!) 94/56   Pulse: 66   Resp: 16   Temp: 98 F (36.7 C)   SpO2: 98% 98%    History of present illness:   Heather [name] is a 45 y.o. female with medical history significant for super morbid obesity, chronic anxiety/depression/PTSD, recurrent UTIs, homelessness, who presented to [facility] ED via EMS from [facility] due to sudden onset episode of nausea and vomiting.  Reports urinary retention over the weekend while at the shelter for the homeless.  Associated with constipation of 3 days duration.  She was discharged from [facility] hospital on 11/18, treated for UTI and discharged on doxycycline to a hotel, she was not able to get a room there.  Presented the same-day to an outside emergency department with chest discomfort, work-up was reassuring and she was discharged from the ED.  From there she went to the shelter as noted above and became acutely ill today.  No recent fevers.  No abdominal pain.  Upon presentation to the ED, work-up revealed urine positive for pyuria and acute urinary retention for which Foley catheter was placed in the ED.  [facility], hospitalist team, was asked to admit.  Patient accepted by Dr. Olevia Bowens, hospitalist service.   ED Course: Afebrile.  BP 127/79, heart rate 101, respiration rate 16.  O2 saturation 90% on room air.  Lab  studies remarkable for BUN 27, creatinine 1.46, calcium 10.7, magnesium 2.5, albumin 3.7.  GFR 46.  WBC 13.7.   02/28/2021: Seen at bedside.  No acute events overnight.  No new complaints.  Hospital Course:  Principal Problem:   Urinary retention  Acute urinary retention s/p foley catheter placement Foley catheter placed at [place] ED on 02/26/2021 Continue Flomax Voiding trial at urology's office Dr. Reggy Eye, Warrick Parisian at [facility], call to make an appointment.   Presumed UTI, POA She was recently admitted at [facility] for the same, received IV antibiotics and was discharged 02/23/2021 on doxycycline. Allergic to Rocephin, started IV ciprofloxacin x2 days. Continue p.o. ciprofloxacin 500 mg twice daily x7 days.   CKD 3B Avoid nephrotoxic agents, dehydration and hypotension   Chronic anxiety/depression/PTSD Continue home regimen   Ambulatory dysfunction Ambulates with a walker Continue fall precautions   Severe morbid obesity BMI 74 Recommend weight loss outpatient. Follow-up with your PCP      Code Status: Full code   Family Communication: None at bedside      Disposition Plan: Will likely discharge to shelter      Procedures: Foley catheter placed at [facility] ED.  Consultations: None  Discharge Exam: BP (!) 94/56 (BP Location: Left Wrist)   Pulse 66   Temp 98 F (36.7 C) (Oral)   Resp 16   Ht 5\' 3"  (1.6 m)   Wt (!) 190.5 kg   SpO2 98%   BMI 74.40 kg/m  General: 45 y.o. year-old female well  developed well nourished in no acute distress.  Alert and oriented x3. Cardiovascular: Regular rate and rhythm with no rubs or gallops.  No thyromegaly or JVD noted.   Respiratory: Clear to auscultation with no wheezes or rales. Good inspiratory effort. Abdomen: Soft nontender nondistended with normal bowel sounds x4 quadrants. Musculoskeletal: No lower extremity edema. 2/4 pulses in all 4 extremities. Skin: No ulcerative lesions noted or  rashes, Psychiatry: Mood is appropriate for condition and setting  Discharge Instructions You were cared for by a hospitalist during your hospital stay. If you have any questions about your discharge medications or the care you received while you were in the hospital after you are discharged, you can call the unit and asked to speak with the hospitalist on call if the hospitalist that took care of you is not available. Once you are discharged, your primary care physician will handle any further medical issues. Please note that NO REFILLS for any discharge medications will be authorized once you are discharged, as it is imperative that you return to your primary care physician (or establish a relationship with a primary care physician if you do not have one) for your aftercare needs so that they can reassess your need for medications and monitor your lab values.   Allergies as of 02/28/2021       Reactions   Amoxicillin Anaphylaxis   Bee Venom Shortness Of Breath, Swelling   Keflex [cephalexin] Anaphylaxis   Penicillin G Anaphylaxis   Penicillins Anaphylaxis   Tolerates ceftriaxone Has patient had a PCN reaction causing immediate rash, facial/tongue/throat swelling, SOB or lightheadedness with hypotension: No Has patient had a PCN reaction causing severe rash involving mucus membranes or skin necrosis: No Has patient had a PCN reaction that required hospitalization No Has patient had a PCN reaction occurring within the last 10 years: No If all of the above answers are "NO", then may proceed with Cephalosporin use.' REACTION: Angioedema   Adhesive [tape] Itching, Rash   Latex Itching, Rash   Vancomycin Other (See Comments)   Pt can tolerate Vancomycin but did cause Vancomycin Infusion Reaction.  Recommend to pre-medicate with Benadryl before doses administered.          Medication List     STOP taking these medications    buPROPion 300 MG 24 hr tablet Commonly known as: WELLBUTRIN  XL   cephALEXin 500 MG capsule Commonly known as: KEFLEX   clonazePAM 0.5 MG tablet Commonly known as: KLONOPIN   clotrimazole 1 % cream Commonly known as: LOTRIMIN   doxycycline 100 MG tablet Commonly known as: VIBRA-TABS   doxycycline 50 MG capsule Commonly known as: VIBRAMYCIN   Dulera 100-5 MCG/ACT Aero Generic drug: mometasone-formoterol   oxyCODONE 5 MG immediate release tablet Commonly known as: Oxy IR/ROXICODONE   topiramate 25 MG tablet Commonly known as: TOPAMAX   traZODone 50 MG tablet Commonly known as: DESYREL   ziprasidone 20 MG capsule Commonly known as: GEODON       TAKE these medications    albuterol 108 (90 Base) MCG/ACT inhaler Commonly known as: VENTOLIN HFA Inhale into the lungs every 6 (six) hours as needed for wheezing or shortness of breath.   Breo Ellipta 100-25 MCG/ACT Aepb Generic drug: fluticasone furoate-vilanterol Inhale 1 puff into the lungs every morning.   ciprofloxacin 500 MG tablet Commonly known as: CIPRO Take 1 tablet (500 mg total) by mouth 2 (two) times daily for 7 days.   diclofenac Sodium 1 % Gel Commonly known as: VOLTAREN  Apply 4 g topically 4 (four) times daily. What changed: when to take this   FLUoxetine 10 MG capsule Commonly known as: PROZAC Take 30 mg by mouth every morning.   fluticasone 50 MCG/ACT nasal spray Commonly known as: FLONASE Place 2 sprays into both nostrils 2 (two) times daily. What changed:  when to take this reasons to take this   furosemide 40 MG tablet Commonly known as: LASIX Take 1 tablet (40 mg total) by mouth daily.   Melatonin 10 MG Tabs Take 10 mg by mouth daily as needed (sleep).   methocarbamol 500 MG tablet Commonly known as: ROBAXIN Take 1 tablet (500 mg total) by mouth 4 (four) times daily.   nystatin powder Commonly known as: MYCOSTATIN/NYSTOP Apply topically 3 (three) times daily. What changed:  how much to take when to take this reasons to take this    ondansetron 4 MG disintegrating tablet Commonly known as: Zofran ODT Take 1 tablet (4 mg total) by mouth every 8 (eight) hours as needed for nausea or vomiting.   potassium chloride SA 20 MEQ tablet Commonly known as: KLOR-CON Take 1 tablet (20 mEq total) by mouth daily.   pregabalin 100 MG capsule Commonly known as: LYRICA Take 1 capsule (100 mg total) by mouth 3 (three) times daily.   rOPINIRole 4 MG tablet Commonly known as: REQUIP Take 1 tablet (4 mg total) by mouth at bedtime.   Senexon-S 8.6-50 MG tablet Generic drug: senna-docusate Take 2 tablets by mouth daily. What changed:  when to take this reasons to take this   SSD 1 % cream Generic drug: silver sulfADIAZINE Apply topically daily. What changed:  how much to take when to take this reasons to take this   tamsulosin 0.4 MG Caps capsule Commonly known as: FLOMAX Take 1 capsule (0.4 mg total) by mouth daily after supper.   vitamin B-12 1000 MCG tablet Commonly known as: CYANOCOBALAMIN Take 1 tablet (1,000 mcg total) by mouth daily.   vitamin C 500 MG tablet Commonly known as: ASCORBIC ACID Take 1 tablet (500 mg total) by mouth daily.       Allergies  Allergen Reactions   Amoxicillin Anaphylaxis   Bee Venom Shortness Of Breath and Swelling   Keflex [Cephalexin] Anaphylaxis   Penicillin G Anaphylaxis   Penicillins Anaphylaxis    Tolerates ceftriaxone Has patient had a PCN reaction causing immediate rash, facial/tongue/throat swelling, SOB or lightheadedness with hypotension: No Has patient had a PCN reaction causing severe rash involving mucus membranes or skin necrosis: No Has patient had a PCN reaction that required hospitalization No Has patient had a PCN reaction occurring within the last 10 years: No If all of the above answers are "NO", then may proceed with Cephalosporin use.'  REACTION: Angioedema   Adhesive [Tape] Itching and Rash   Latex Itching and Rash   Vancomycin Other (See  Comments)    Pt can tolerate Vancomycin but did cause Vancomycin Infusion Reaction.  Recommend to pre-medicate with Benadryl before doses administered.      Follow-up Information     Roxanne Mins, MD. Call in 1 day(s).   Specialty: Urology Why: Please call for a post hospital follow-up appointment and for a voiding trial. Contact information: [place] [address] [phone]         Lucianne Lei, MD. Call in 1 day(s).   Specialty: Family Medicine Why: Please call for a posthospital follow-up appointment. Contact information: [address] [place] [address]  [phone]                  The results of significant diagnostics from this hospitalization (including imaging, microbiology, ancillary and laboratory) are listed below for reference.    Significant Diagnostic Studies: DG Chest 2 View  Result Date: 02/08/2021 CLINICAL DATA:  Chest pain and cough. EXAM: CHEST - 2 VIEW COMPARISON:  January 25, 2021 FINDINGS: The heart size and mediastinal contours are within normal limits. Both lungs are clear. A small radiopaque foreign body is seen overlying the superior mediastinum, just below the level of the clavicles on the frontal view. This may be consistent with a pendant noted within this region on the lateral image. The visualized skeletal structures are unremarkable. IMPRESSION: No active cardiopulmonary disease. Electronically Signed   By: Virgina Norfolk M.D.   On: 02/08/2021 00:23   DG Knee Complete 4 Views Left  Result Date: 02/16/2021 CLINICAL DATA:  Fall, left knee pain and weakness. EXAM: LEFT KNEE - COMPLETE 4+ VIEW COMPARISON:  08/10/2020. FINDINGS: No acute fracture or dislocation is seen. There is a bony fragment along the lateral aspect of the patellar border which is unchanged from the prior exam. The patella is high riding. Moderate to severe joint space narrowing, subchondral sclerosis, and osteophyte formation is noted at all 3  compartments. There is no obvious joint effusion. No focal soft tissue abnormality is identified. IMPRESSION: 1. No acute fracture or dislocation. 2. Moderate to severe tricompartmental degenerative changes. Electronically Signed   By: Brett Fairy M.D.   On: 02/16/2021 00:35   CT Renal Stone Study  Result Date: 02/26/2021 CLINICAL DATA:  45 year old female with history of nausea, vomiting and diarrhea with urinary retention. Abdominal pain. EXAM: CT ABDOMEN AND PELVIS WITHOUT CONTRAST TECHNIQUE: Multidetector CT imaging of the abdomen and pelvis was performed following the standard protocol without IV contrast. COMPARISON:  CT the abdomen and pelvis 01/26/2021. FINDINGS: Lower chest: Unremarkable. Hepatobiliary: Diffuse low attenuation throughout the hepatic parenchyma, indicative of a background of hepatic steatosis. No discrete cystic or solid hepatic lesions are confidently identified on today's noncontrast CT examination. Status post cholecystectomy. Pancreas: No definite pancreatic mass or peripancreatic fluid collections or inflammatory changes are noted on today's noncontrast CT examination. Spleen: Unremarkable. Adrenals/Urinary Tract: Several large calculi are present in both renal collecting systems, largest of which is a staghorn calculus in the lower pole collecting system of the left kidney which measures approximally 2.4 x 5.0 x 4.5 cm and extends into the left renal pelvis nearly to the level of the ureteropelvic junction. Large low-attenuation lesion in the upper pole of the right kidney, incompletely characterized on today's non-contrast CT examination, but similar to the prior study and statistically likely to represent a large cyst, currently measuring 8.3 cm in diameter (unchanged). Mild atrophy and multifocal cortical thinning in the left kidney. There are no calculi identified along the course of either ureter or in the lumen of the urinary bladder. No hydroureteronephrosis. Urinary  bladder is completely decompressed with an indwelling Foley balloon catheter, but otherwise unremarkable in appearance. Bilateral adrenal glands are normal in appearance. Stomach/Bowel: Unenhanced appearance of the stomach is normal. There is no pathologic dilatation of small bowel or colon. Normal appendix. Vascular/Lymphatic: No atherosclerotic calcifications are noted in the abdominal aorta or pelvic vasculature. No lymphadenopathy noted in the abdomen or pelvis. Reproductive: Unenhanced appearance of the uterus and ovaries is unremarkable. Other: No significant volume of ascites.  No pneumoperitoneum. Musculoskeletal: There are no aggressive appearing lytic  or blastic lesions noted in the visualized portions of the skeleton. IMPRESSION: 1. Multiple large nonobstructive calculi in the collecting systems of both kidneys, including a large staghorn calculus in the lower pole collecting system of left kidney, as above. No ureteral stones or findings of urinary tract obstruction are noted at this time. 2. Hepatic steatosis. 3. Additional incidental findings, as above. Electronically Signed   By: Vinnie Langton M.D.   On: 02/26/2021 07:01    Microbiology: Recent Results (from the past 240 hour(s))  Urine Culture     Status: Abnormal (Preliminary result)   Collection Time: 02/26/21  4:24 AM   Specimen: In/Out Cath Urine  Result Value Ref Range Status   Specimen Description   Final    IN/OUT CATH URINE Performed at [facility], [address] [place],  [address]    Special Requests   Final    NONE Performed at [facility], [place], [place] [address]    Culture (A)  Final    20,000 COLONIES/mL ENTEROCOCCUS FAECIUM 20,000 COLONIES/mL GRAM NEGATIVE RODS IDENTIFICATION AND SUSCEPTIBILITIES TO FOLLOW Performed at [facility], [place] [address] [place],  [address]    Report Status PENDING  Incomplete  Resp Panel by RT-PCR  (Flu A&B, Covid) Nasopharyngeal Swab     Status: None   Collection Time: 02/26/21  8:14 AM   Specimen: Nasopharyngeal Swab; Nasopharyngeal(NP) swabs in vial transport medium  Result Value Ref Range Status   SARS Coronavirus 2 by RT PCR NEGATIVE NEGATIVE Final    Comment: (NOTE) SARS-CoV-2 target nucleic acids are NOT DETECTED.  The SARS-CoV-2 RNA is generally detectable in upper respiratory specimens during the acute phase of infection. The lowest concentration of SARS-CoV-2 viral copies this assay can detect is 138 copies/mL. A negative result does not preclude SARS-Cov-2 infection and should not be used as the sole basis for treatment or other patient management decisions. A negative result may occur with  improper specimen collection/handling, submission of specimen other than nasopharyngeal swab, presence of viral mutation(s) within the areas targeted by this assay, and inadequate number of viral copies(<138 copies/mL). A negative result must be combined with clinical observations, patient history, and epidemiological information. The expected result is Negative.  Fact Sheet for Patients:  EntrepreneurPulse.com.au  Fact Sheet for Healthcare Providers:  IncredibleEmployment.be  This test is no t yet approved or cleared by the [place] FDA and  has been authorized for detection and/or diagnosis of SARS-CoV-2 by FDA under an Emergency Use Authorization (EUA). This EUA will remain  in effect (meaning this test can be used) for the duration of the COVID-19 declaration under Section 564(b)(1) of the Act, 21 U.S.C.section 360bbb-3(b)(1), unless the authorization is terminated  or revoked sooner.       Influenza A by PCR NEGATIVE NEGATIVE Final   Influenza B by PCR NEGATIVE NEGATIVE Final    Comment: (NOTE) The Xpert Xpress SARS-CoV-2/FLU/RSV plus assay is intended as an aid in the diagnosis of influenza from Nasopharyngeal swab specimens  and should not be used as a sole basis for treatment. Nasal washings and aspirates are unacceptable for Xpert Xpress SARS-CoV-2/FLU/RSV testing.  Fact Sheet for Patients: EntrepreneurPulse.com.au  Fact Sheet for Healthcare Providers: IncredibleEmployment.be  This test is not yet approved or cleared by the [place] FDA and has been authorized for detection and/or diagnosis of SARS-CoV-2 by FDA under an Emergency Use Authorization (EUA). This EUA will remain in effect (meaning this test can be  used) for the duration of the COVID-19 declaration under Section 564(b)(1) of the Act, 21 U.S.C. section 360bbb-3(b)(1), unless the authorization is terminated or revoked.  Performed at [facility], [address] [place], [place] [address]      Labs: Basic Metabolic Panel: Recent Labs  Lab 02/26/21 0454 02/27/21 0352 02/27/21 0900 02/28/21 0402  NA 139  --  137 138  K 3.9  --  3.8 3.4*  CL 103  --  105 105  CO2 24  --  23 25  GLUCOSE 98  --  106* 89  BUN 27*  --  19 18  CREATININE 1.46* 1.02* 1.14* 1.00  CALCIUM 10.7*  --  9.1 9.2   Liver Function Tests: No results for input(s): AST, ALT, ALKPHOS, BILITOT, PROT, ALBUMIN in the last 168 hours. No results for input(s): LIPASE, AMYLASE in the last 168 hours. No results for input(s): AMMONIA in the last 168 hours. CBC: Recent Labs  Lab 02/26/21 0544 02/27/21 0900 02/28/21 0402  WBC 13.7* 6.5 7.9  NEUTROABS 10.8*  --   --   HGB 12.0 10.8* 9.8*  HCT 40.7 36.4 33.4*  MCV 84.6 84.5 86.3  PLT 403* PLATELET CLUMPS NOTED ON SMEAR, UNABLE TO ESTIMATE 320   Cardiac Enzymes: No results for input(s): CKTOTAL, CKMB, CKMBINDEX, TROPONINI in the last 168 hours. BNP: BNP (last 3 results) No results for input(s): BNP in the last 8760 hours.  ProBNP (last 3 results) No results for input(s): PROBNP in the last 8760 hours.  CBG: No results for input(s): GLUCAP in the last  168 hours.     Signed:  Kayleen Memos, MD [facility] 02/28/2021, 4:33 PM

## 2021-02-28 NOTE — TOC Transition Note (Signed)
Transition of Care [facility]) - CM/SW Discharge Note   Patient Details  Name: Heather [name] MRN: [id number] Date of Birth: 04/25/1975  [facility] CM/SW Contact:  Leeroy Cha, RN Phone Number: 02/28/2021, 9:17 AM   Clinical Narrative:    Patient dcd to go to the irc to get hotel room.     Final next level of care: Homeless Shelter Barriers to Discharge: No Barriers Identified   Patient Goals and CMS Choice Patient states their goals for this hospitalization and ongoing recovery are:: To get better, find a place to live CMS Medicare.gov Compare Post Acute Care list provided to:: Patient Choice offered to / list presented to : Patient  Discharge Placement                       Discharge Plan and Services                                     Social Determinants of Health (SDOH) Interventions     Readmission Risk Interventions Readmission Risk Prevention Plan 12/08/2020  Transportation Screening Complete  Medication Review Press photographer) Complete  PCP or Specialist appointment within 3-5 days of discharge Complete  HRI or [place] Complete  SW Recovery Care/Counseling Consult Complete  [place] Not Applicable  Some recent data might be hidden

## 2021-03-01 LAB — URINE CULTURE: Culture: 20000 — AB

## 2021-03-03 ENCOUNTER — Emergency Department (HOSPITAL_COMMUNITY)
Admission: EM | Admit: 2021-03-03 | Discharge: 2021-03-03 | Disposition: A | Discharge status: Home / Self Care | Payer: 59 | Attending: Emergency Medicine | Admitting: Emergency Medicine

## 2021-03-03 ENCOUNTER — Encounter (HOSPITAL_COMMUNITY): Payer: Self-pay

## 2021-03-03 ENCOUNTER — Other Ambulatory Visit: Payer: Self-pay

## 2021-03-03 DIAGNOSIS — Z87891 Personal history of nicotine dependence: Secondary | ICD-10-CM | POA: Insufficient documentation

## 2021-03-03 DIAGNOSIS — J449 Chronic obstructive pulmonary disease, unspecified: Secondary | ICD-10-CM | POA: Insufficient documentation

## 2021-03-03 DIAGNOSIS — J45909 Unspecified asthma, uncomplicated: Secondary | ICD-10-CM | POA: Insufficient documentation

## 2021-03-03 DIAGNOSIS — Z466 Encounter for fitting and adjustment of urinary device: Secondary | ICD-10-CM

## 2021-03-03 DIAGNOSIS — E119 Type 2 diabetes mellitus without complications: Secondary | ICD-10-CM | POA: Diagnosis not present

## 2021-03-03 NOTE — Discharge Instructions (Signed)
You were evaluated in the Emergency Department and after careful evaluation, we did not find any emergent condition requiring admission or further testing in the hospital.  Your exam/testing today was overall reassuring.  Please return to the Emergency Department if you experience any worsening of your condition.  Thank you for allowing us to be a part of your care.  

## 2021-03-03 NOTE — ED Notes (Signed)
Pt given [place] sandwich, crackers, and coke to drink. Pt denies complaints.

## 2021-03-03 NOTE — ED Notes (Signed)
Pt verbalizes understanding of discharge instructions. Opportunity for questions and answers were provided. Pt discharged from the ED via [place].

## 2021-03-03 NOTE — ED Triage Notes (Signed)
Pt BIB [facility] from home c/o pain at her catheter site. Pt was suppose to be seen at [facility] but was unable to get there due to the holiday. Pt rates pain 9/10.

## 2021-03-03 NOTE — ED Provider Notes (Signed)
[facility] Emergency Department Provider Note MRN:  [id number]  Arrival date & time: 03/03/21     Chief Complaint   Catheter problem History of Present Illness   Heather [name] is a 45 y.o. year-old female with a history of COPD, diabetes, morbid obesity presenting to the ED with chief complaint of catheter problem.  Pain at the urethral urinary catheter site for the past few hours.  Denies any other symptoms.  Pain is mild to moderate, constant.   Review of Systems  A problem-focused ROS was performed. Positive for catheter brought.  Patient denies fever.  Patient's Health History    Past Medical History:  Diagnosis Date   Anxiety    Arthritis    Asthma    Back pain    Constipation    COPD (chronic obstructive pulmonary disease) ([place])    Depression    Diabetes mellitus without complication ([place])    patient denies but states she has hyperglycemia-diet controlled   Dysrhythmia    DR [facility]     Ectopic pregnancy 4503   Eosinophilic esophagitis    Diagnosed at [facility] 06/16/2013, untreated   GERD (gastroesophageal reflux disease)    HEARTBURN   TUMS   High cholesterol    IBS (irritable bowel syndrome)    Leukocytosis 07/28/2008   Qualifier: Diagnosis of  By: Jonna Munro MD, Cornelius     Morbid obesity [facility])    Neuromuscular disorder ([place])    RESTLESS LEG    Obesity    Schizoaffective disorder, bipolar type ([place])    Sleep apnea    CPAP- in process of restarting     Past Surgical History:  Procedure Laterality Date   BIOPSY  08/13/2018   Procedure: BIOPSY;  Surgeon: Yetta Flock, MD;  Location: [facility];  Service: Gastroenterology;;   CESAREAN SECTION MULTI-GESTATIONAL N/A 02/03/2017   Procedure: [place];  Surgeon: Jonnie Kind, MD;  Location: [facility];  Service: Obstetrics;  Laterality: N/A;   CHOLECYSTECTOMY     COLONOSCOPY WITH PROPOFOL N/A 08/13/2018   Procedure: COLONOSCOPY WITH  PROPOFOL;  Surgeon: Yetta Flock, MD;  Location: [facility];  Service: Gastroenterology;  Laterality: N/A;   DENTAL SURGERY     ESOPHAGOGASTRODUODENOSCOPY  May 2007   Dr. Gala Romney: Normal esophagus, stomach, D1, D2   ESOPHAGOGASTRODUODENOSCOPY  06/16/2013   Dr. Carlton Adam, eosinophilic esophagitis, reactive gastropathy, no esophageal dilation   ESOPHAGOGASTRODUODENOSCOPY (EGD) WITH PROPOFOL N/A 08/13/2018   Procedure: ESOPHAGOGASTRODUODENOSCOPY (EGD) WITH PROPOFOL;  Surgeon: Yetta Flock, MD;  [facility];  Service: Gastroenterology;  Laterality: N/A;   POLYPECTOMY  08/13/2018   Procedure: POLYPECTOMY;  Surgeon: Yetta Flock, MD;  Location: [facility];  Service: Gastroenterology;;   TONSILLECTOMY     TOOTH EXTRACTION  10/28/2011   Procedure: DENTAL RESTORATION/EXTRACTIONS;  Surgeon: Gae Bon, DDS;  Location: [facility] OR;  Service: Oral Surgery;;   UPPER GASTROINTESTINAL ENDOSCOPY      Family History  Problem Relation Age of Onset   Depression Mother    Anxiety disorder Mother    High blood pressure Mother    Bipolar disorder Mother    Eating disorder Mother    Obesity Mother    Hypertension Sister    Allergic rhinitis Sister    Colon polyps Maternal Grandmother        53s   Diabetes Maternal Grandmother    Anxiety disorder Maternal Grandmother    COPD Maternal Grandmother    Crohn's disease Maternal Aunt  Cancer Maternal Grandfather        prostate   HIV/AIDS Father    Eating disorder Father    Obesity Father    Liver disease Neg Hx    Angioedema Neg Hx    Eczema Neg Hx    Immunodeficiency Neg Hx    Asthma Neg Hx    Urticaria Neg Hx    Colon cancer Neg Hx    Esophageal cancer Neg Hx    Rectal cancer Neg Hx    Stomach cancer Neg Hx     Social History   Socioeconomic History   Marital status: Legally Separated    Spouse name: Gwyndolyn Saxon   Number of children: 0   Years of education: Not on file   Highest education level: Not on file   Occupational History   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 had a pack the other day  Other Topics Concern   Not on file  Social History Narrative   Not on file   Social Determinants of Health   Financial Resource Strain: Not on file  Food Insecurity: Not on file  Transportation Needs: Not on file  Physical Activity: Not on file  Stress: Not on file  Social Connections: Not on file  Intimate Partner Violence: Not on file     Physical Exam   Vitals:   03/03/21 0418 03/03/21 0420  BP:  (!) 144/81  Pulse:  74  Resp:  20  Temp:  98.5 F (36.9 C)  SpO2: 100% 100%    CONSTITUTIONAL: Well-appearing, NAD, obese NEURO:  Alert and oriented x 3, no focal deficits EYES:  eyes equal and reactive ENT/NECK:  no LAD, no JVD CARDIO: Regular rate, well-perfused, normal S1 and S2 PULM:  CTAB no wheezing or rhonchi GI/GU:  normal bowel sounds, non-distended, non-tender MSK/SPINE:  No gross deformities, no edema SKIN:  no rash, atraumatic PSYCH:  Appropriate speech and behavior  *Additional and/or pertinent findings included in MDM below  Diagnostic and Interventional Summary    EKG Interpretation  Date/Time:    Ventricular Rate:    PR Interval:    QRS Duration:   QT Interval:    QTC Calculation:   R Axis:     Text Interpretation:         Labs Reviewed - No data to display  No orders to display    Medications - No data to display   Procedures  /   Critical Care Procedures  ED Course and Medical Decision Making  I have reviewed the triage vital signs, the nursing notes, and pertinent available records from the EMR.  Listed above are laboratory and imaging tests that I personally ordered, reviewed, and interpreted and then considered in my medical decision making (see below for details).  Urethral pain exacerbated by movement of the Foley catheter.  Does not have  any abdominal pain, no suprapubic pain.  Exam challenging due to morbid obesity requiring chaperone and assistant.  Vaginal area soiled with feces.  But no signs of infection, no crepitus.  Will replace Foley catheter to see if this helps patient's symptoms.       Barth Kirks. Sedonia Small, [place] [email or web address]  Final Clinical Impressions(s) / ED Diagnoses     ICD-10-CM   1. Catheter (urine) change required  Z46.6       ED Discharge Orders     None        Discharge Instructions Discussed with and Provided to Patient:     Discharge Instructions      You were evaluated in the Emergency Department and after careful evaluation, we did not find any emergent condition requiring admission or further testing in the hospital.  Your exam/testing today was overall reassuring.  Please return to the Emergency Department if you experience any worsening of your condition.  Thank you for allowing [place] to be a part of your care.         Maudie Flakes, MD 03/03/21 [phone]

## 2021-03-04 ENCOUNTER — Emergency Department (HOSPITAL_COMMUNITY)
Admission: EM | Admit: 2021-03-04 | Discharge: 2021-03-05 | Disposition: A | Discharge status: Home / Self Care | Payer: 59 | Attending: Emergency Medicine | Admitting: Emergency Medicine

## 2021-03-04 DIAGNOSIS — Z91040 Latex allergy status: Secondary | ICD-10-CM | POA: Insufficient documentation

## 2021-03-04 DIAGNOSIS — J449 Chronic obstructive pulmonary disease, unspecified: Secondary | ICD-10-CM | POA: Diagnosis not present

## 2021-03-04 DIAGNOSIS — T83098A Other mechanical complication of other indwelling urethral catheter, initial encounter: Secondary | ICD-10-CM | POA: Insufficient documentation

## 2021-03-04 DIAGNOSIS — Z7951 Long term (current) use of inhaled steroids: Secondary | ICD-10-CM | POA: Diagnosis not present

## 2021-03-04 DIAGNOSIS — Y829 Unspecified medical devices associated with adverse incidents: Secondary | ICD-10-CM | POA: Insufficient documentation

## 2021-03-04 DIAGNOSIS — E1122 Type 2 diabetes mellitus with diabetic chronic kidney disease: Secondary | ICD-10-CM | POA: Diagnosis not present

## 2021-03-04 DIAGNOSIS — R339 Retention of urine, unspecified: Secondary | ICD-10-CM | POA: Diagnosis not present

## 2021-03-04 DIAGNOSIS — J45909 Unspecified asthma, uncomplicated: Secondary | ICD-10-CM | POA: Insufficient documentation

## 2021-03-04 DIAGNOSIS — Z87891 Personal history of nicotine dependence: Secondary | ICD-10-CM | POA: Diagnosis not present

## 2021-03-04 DIAGNOSIS — N183 Chronic kidney disease, stage 3 unspecified: Secondary | ICD-10-CM | POA: Insufficient documentation

## 2021-03-04 DIAGNOSIS — T839XXA Unspecified complication of genitourinary prosthetic device, implant and graft, initial encounter: Secondary | ICD-10-CM

## 2021-03-04 LAB — BASIC METABOLIC PANEL
Anion gap: 11 (ref 5–15)
BUN: 10 mg/dL (ref 6–20)
CO2: 24 mmol/L (ref 22–32)
Calcium: 9.7 mg/dL (ref 8.9–10.3)
Chloride: 102 mmol/L (ref 98–111)
Creatinine, Ser: 1.19 mg/dL — ABNORMAL HIGH (ref 0.44–1.00)
GFR, Estimated: 58 mL/min — ABNORMAL LOW (ref >60)
Glucose, Bld: 90 mg/dL (ref 70–99)
Potassium: 3.2 mmol/L — ABNORMAL LOW (ref 3.5–5.1)
Sodium: 137 mmol/L (ref 135–145)

## 2021-03-04 LAB — CBC WITH DIFFERENTIAL/PLATELET
Abs Immature Granulocytes: 0.04 10*3/uL (ref 0.00–0.07)
Basophils Absolute: 0 10*3/uL (ref 0.0–0.1)
Basophils Relative: 0 %
Eosinophils Absolute: 0.7 10*3/uL — ABNORMAL HIGH (ref 0.0–0.5)
Eosinophils Relative: 6 %
HCT: 43.9 % (ref 36.0–46.0)
Hemoglobin: 13 g/dL (ref 12.0–15.0)
Immature Granulocytes: 0 %
Lymphocytes Relative: 24 %
Lymphs Abs: 2.7 10*3/uL (ref 0.7–4.0)
MCH: 25 pg — ABNORMAL LOW (ref 26.0–34.0)
MCHC: 29.6 g/dL — ABNORMAL LOW (ref 30.0–36.0)
MCV: 84.6 fL (ref 80.0–100.0)
Monocytes Absolute: 0.6 10*3/uL (ref 0.1–1.0)
Monocytes Relative: 5 %
Neutro Abs: 7.2 10*3/uL (ref 1.7–7.7)
Neutrophils Relative %: 65 %
Platelets: 455 10*3/uL — ABNORMAL HIGH (ref 150–400)
RBC: 5.19 MIL/uL — ABNORMAL HIGH (ref 3.87–5.11)
RDW: 16.5 % — ABNORMAL HIGH (ref 11.5–15.5)
WBC: 11.2 10*3/uL — ABNORMAL HIGH (ref 4.0–10.5)
nRBC: 0 % (ref 0.0–0.2)

## 2021-03-04 NOTE — ED Provider Notes (Signed)
Emergency Medicine Provider Triage Evaluation Note  Sharyah Bostwick Willcox , a 45 y.o. female  was evaluated in triage.  Pt complains of pain to foley cath (placed for retention with recent admission for similar.) Dark urine, denies blood. Decreased po intake and decreased foley output. No fever, emesis, back pain. Seen yesterday for similar, had foley replaced. States she needs help with her ADLs and she cannot take care of her self  Review of Systems  Positive: Foley cath pain, decreased po intake Negative:   Physical Exam  There were no vitals taken for this visit. Gen:   Awake, no distress   Resp:  Normal effort  MSK:   Moves extremities without difficulty  Other:    Medical Decision Making  Medically screening exam initiated at 7:34 PM.  Appropriate orders placed.  Maame Dack Gottlieb was informed that the remainder of the evaluation will be completed by another provider, this initial triage assessment does not replace that evaluation, and the importance of remaining in the ED until their evaluation is complete.  Foley cath pain, seen yesterday for similar, decreased po intake.  Foley draining here will get UA, check labs   Nettie Elm, PA-C 03/04/21 1934    Wyvonnia Dusky, MD 03/04/21 2109

## 2021-03-04 NOTE — ED Triage Notes (Signed)
The pt arrived by [facility] from home with c/os foley cath pain  here on the 26 th for the same

## 2021-03-05 LAB — URINALYSIS, ROUTINE W REFLEX MICROSCOPIC
Bilirubin Urine: NEGATIVE
Glucose, UA: NEGATIVE mg/dL
Ketones, ur: NEGATIVE mg/dL
Nitrite: NEGATIVE
Protein, ur: NEGATIVE mg/dL
Specific Gravity, Urine: 1.005 (ref 1.005–1.030)
WBC, UA: >50 WBC/hpf — ABNORMAL HIGH (ref 0–5)
pH: 6 (ref 5.0–8.0)

## 2021-03-05 NOTE — ED Notes (Signed)
Patient reports she has been unable to urinate at this time.  Will continue to monitor

## 2021-03-05 NOTE — ED Notes (Signed)
Patient has been able to urinate without difficulty.

## 2021-03-05 NOTE — ED Provider Notes (Signed)
[facility] EMERGENCY DEPARTMENT Provider Note   CSN: [id number] Arrival date & time: 03/04/21  1815     History No chief complaint on file.   Heather [name] is a 45 y.o. female.  The history is provided by the patient and medical records.  Heather [name] is a 45 y.o. female who presents to the Emergency Department complaining of Foley catheter pain. She presents the emergency department complaining of pain at her Foley catheter site. This was placed about one week ago for urinary retention. She was evaluated in the emergency department on Friday and had the catheter exchange. She presents today due to ongoing pain from catheter placement. Pain is located in the vaginal region as well as suprapubic region. No associated fevers. She does have nausea. She is having diarrhea with three episodes of diarrhea daily for the last two days. She is supposed to follow-up at the [facility] for voiding trial but is unable to do this due to transportation issues.    Past Medical History:  Diagnosis Date   Anxiety    Arthritis    Asthma    Back pain    Constipation    COPD (chronic obstructive pulmonary disease) ([place])    Depression    Diabetes mellitus without complication ([place])    patient denies but states she has hyperglycemia-diet controlled   Dysrhythmia    DR Johnsie Cancel     Ectopic pregnancy 4481   Eosinophilic esophagitis    Diagnosed at [facility] 06/16/2013, untreated   GERD (gastroesophageal reflux disease)    HEARTBURN   TUMS   High cholesterol    IBS (irritable bowel syndrome)    Leukocytosis 07/28/2008   Qualifier: Diagnosis of  By: Jonna Munro MD, Cornelius     Morbid obesity [facility])    Neuromuscular disorder ([place])    RESTLESS LEG    Obesity    Schizoaffective disorder, bipolar type ([place])    Sleep apnea    CPAP- in process of restarting     Patient Active Problem List   Diagnosis Date Noted   Urinary retention 02/26/2021   Homelessness 02/07/2021    Cystitis 01/26/2021   Social problem 01/26/2021   Enterococcus faecalis infection 01/26/2021   Mood disorder ([place]) 01/26/2021   Chronic pain disorder 01/26/2021   Thrombocytosis 10/31/2020   CKD (chronic kidney disease), stage III ([place]) 10/31/2020   Major depressive disorder, recurrent ([place]) 09/13/2020   Insomnia    Anemia of chronic disease    Chronic bilateral low back pain without sciatica    Debility 08/24/2020   SIRS (systemic inflammatory response syndrome) ([place]) 08/18/2020   Pressure injury of skin 08/11/2020   Rhabdomyolysis 08/10/2020   Acute renal failure ([employer]) 08/10/2020   PTSD (post-traumatic stress disorder) 05/15/2020   Major depressive disorder, recurrent episode, moderate ([place]) 05/05/2020   Generalized anxiety disorder 05/05/2020   Blood in stool    Gastritis and gastroduodenitis    Benign neoplasm of sigmoid colon    Class 3 severe obesity with serious comorbidity and body mass index (BMI) greater than or equal to 70 in adult ([place]) 06/16/2018   Nexplanon insertion 03/27/2017   Postpartum hypertension 03/05/2017   Perennial allergic rhinitis 11/05/2016   Mild persistent asthma with acute exacerbation 11/05/2016   Hypoglycemia 11/07/2015   OSA (obstructive sleep apnea) 11/07/2015   DM type 2 (diabetes mellitus, type 2) ([place]) 12/07/2014   Panniculitis 12/06/2014   Cellulitis, abdominal wall 11/11/2014   Abdominal pain 85/63/1497   Eosinophilic esophagitis  07/22/2013   Change in bowel habits 04/28/2013   RLS (restless legs syndrome) 08/08/2011   Adjustment disorder 55/37/4827   DYSMETABOLIC SYNDROME 07/86/7544   Essential hypertension 05/12/2006   Asthma 05/12/2006   OSTEOARTHRITIS 05/12/2006    Past Surgical History:  Procedure Laterality Date   BIOPSY  08/13/2018   Procedure: BIOPSY;  Surgeon: Yetta Flock, MD;  Location: [facility];  Service: Gastroenterology;;   CESAREAN SECTION MULTI-GESTATIONAL N/A 02/03/2017   Procedure: [place];  Surgeon: Jonnie Kind, MD;  Location: [facility];  Service: Obstetrics;  Laterality: N/A;   CHOLECYSTECTOMY     COLONOSCOPY WITH PROPOFOL N/A 08/13/2018   Procedure: COLONOSCOPY WITH PROPOFOL;  Surgeon: Yetta Flock, MD;  [facility];  Service: Gastroenterology;  Laterality: N/A;   DENTAL SURGERY     ESOPHAGOGASTRODUODENOSCOPY  May 2007   Dr. Gala Romney: Normal esophagus, stomach, D1, D2   ESOPHAGOGASTRODUODENOSCOPY  06/16/2013   Dr. Carlton Adam, eosinophilic esophagitis, reactive gastropathy, no esophageal dilation   ESOPHAGOGASTRODUODENOSCOPY (EGD) WITH PROPOFOL N/A 08/13/2018   Procedure: ESOPHAGOGASTRODUODENOSCOPY (EGD) WITH PROPOFOL;  Surgeon: Yetta Flock, MD;  [facility];  Service: Gastroenterology;  Laterality: N/A;   POLYPECTOMY  08/13/2018   Procedure: POLYPECTOMY;  Surgeon: Yetta Flock, MD;  [facility];  Service: Gastroenterology;;   TONSILLECTOMY     TOOTH EXTRACTION  10/28/2011   Procedure: DENTAL RESTORATION/EXTRACTIONS;  Surgeon: Gae Bon, DDS;  Location: [facility];  Service: Oral Surgery;;   UPPER GASTROINTESTINAL ENDOSCOPY       OB History     Gravida  2   Para  1   Term  1   Preterm      AB  1   Living  2      SAB  0   IAB      Ectopic  1   Multiple  1   Live Births  2           Family History  Problem Relation Age of Onset   Depression Mother    Anxiety disorder Mother    High blood pressure Mother    Bipolar disorder Mother    Eating disorder Mother    Obesity Mother    Hypertension Sister    Allergic rhinitis Sister    Colon polyps Maternal Grandmother        68s   Diabetes Maternal Grandmother    Anxiety disorder Maternal Grandmother    COPD Maternal Grandmother    Crohn's disease Maternal Aunt    Cancer Maternal Grandfather        prostate   HIV/AIDS Father    Eating disorder Father    Obesity Father    Liver disease Neg Hx    Angioedema Neg Hx     Eczema Neg Hx    Immunodeficiency Neg Hx    Asthma Neg Hx    Urticaria Neg Hx    Colon cancer Neg Hx    Esophageal cancer Neg Hx    Rectal cancer Neg Hx    Stomach cancer Neg Hx     Social History   Tobacco Use   Smoking status: Former    Packs/day: 0.50    Years: 8.00    Pack years: 4.00    Types: Cigarettes    Quit date: 04/25/2011    Years since quitting: 9.8   Smokeless tobacco: Never  Vaping Use   Vaping Use: Never used  Substance Use Topics   Alcohol use:  Not Currently    Comment: occasional "relapse" of a wine cooler   Drug use: Not Currently    Types: Marijuana    Home Medications Prior to Admission medications   Medication Sig Start Date End Date Taking? Authorizing Provider  albuterol (VENTOLIN HFA) 108 (90 Base) MCG/ACT inhaler Inhale into the lungs every 6 (six) hours as needed for wheezing or shortness of breath.    [provider]  ascorbic acid (VITAMIN C) 500 MG tablet Take 1 tablet (500 mg total) by mouth daily. 12/27/20   Samella Parr, NP  ciprofloxacin (CIPRO) 500 MG tablet Take 1 tablet (500 mg total) by mouth 2 (two) times daily for 7 days. Patient taking differently: Take 500 mg by mouth See admin instructions. Bid x 7 days 02/27/21 03/06/21  Kayleen Memos, DO  diclofenac Sodium (VOLTAREN) 1 % GEL Apply 4 g topically 4 (four) times daily. Patient taking differently: Apply 4 g topically 3 (three) times daily. Apply to knees and lower back 12/27/20   Samella Parr, NP  FLUoxetine (PROZAC) 10 MG capsule Take 30 mg by mouth every morning. 02/22/21   [provider]  fluticasone (FLONASE) 50 MCG/ACT nasal spray Place 2 sprays into both nostrils 2 (two) times daily. Patient taking differently: Place 2 sprays into both nostrils 2 (two) times daily as needed for allergies or rhinitis. 12/27/20   Samella Parr, NP  fluticasone furoate-vilanterol (BREO ELLIPTA) 100-25 MCG/ACT AEPB Inhale 1 puff into the lungs every morning.     [provider]  furosemide (LASIX) 40 MG tablet Take 1 tablet (40 mg total) by mouth daily. 01/04/21   Samella Parr, NP  Melatonin 10 MG TABS Take 10 mg by mouth daily as needed (sleep).    [provider]  methocarbamol (ROBAXIN) 500 MG tablet Take 1 tablet (500 mg total) by mouth 4 (four) times daily. Patient taking differently: Take 500 mg by mouth every 6 (six) hours as needed for muscle spasms. 12/27/20   Samella Parr, NP  nystatin (MYCOSTATIN/NYSTOP) powder Apply topically 3 (three) times daily. Patient taking differently: Apply 1 application topically 3 (three) times daily as needed (rash/itching). 12/27/20   Samella Parr, NP  ondansetron (ZOFRAN ODT) 4 MG disintegrating tablet Take 1 tablet (4 mg total) by mouth every 8 (eight) hours as needed for nausea or vomiting. 02/08/21   Quintella Reichert, MD  potassium chloride SA (KLOR-CON) 20 MEQ tablet Take 1 tablet (20 mEq total) by mouth daily. 01/04/21   Samella Parr, NP  pregabalin (LYRICA) 100 MG capsule Take 1 capsule (100 mg total) by mouth 3 (three) times daily. 12/27/20   Samella Parr, NP  rOPINIRole (REQUIP) 4 MG tablet Take 1 tablet (4 mg total) by mouth at bedtime. 12/27/20   Samella Parr, NP  senna-docusate (SENOKOT-S) 8.6-50 MG tablet Take 2 tablets by mouth daily. Patient taking differently: Take 2 tablets by mouth daily as needed (constipation). 12/27/20   Samella Parr, NP  silver sulfADIAZINE (SILVADENE) 1 % cream Apply topically daily. Patient taking differently: Apply 1 application topically daily as needed (wound care). 01/04/21   Samella Parr, NP  tamsulosin (FLOMAX) 0.4 MG CAPS capsule Take 1 capsule (0.4 mg total) by mouth daily after supper. 02/28/21 03/30/21  Kayleen Memos, DO  vitamin B-12 (CYANOCOBALAMIN) 1000 MCG tablet Take 1 tablet (1,000 mcg total) by mouth daily. 12/27/20   Samella Parr, NP    Allergies    Amoxicillin,  Bee venom, Ciprofloxacin, Keflex [cephalexin],  Penicillin g, Penicillins, Adhesive [tape], Latex, and Vancomycin  Review of Systems   Review of Systems  All other systems reviewed and are negative.  Physical Exam Updated Vital Signs BP 121/74 (BP Location: Right Arm)   Pulse 78   Temp (!) 97.5 F (36.4 C) (Oral)   Resp 19   SpO2 100%   Physical Exam Vitals and nursing note reviewed.  Constitutional:      Appearance: She is well-developed.  HENT:     Head: Normocephalic and atraumatic.  Cardiovascular:     Rate and Rhythm: Normal rate and regular rhythm.  Pulmonary:     Effort: Pulmonary effort is normal. No respiratory distress.  Abdominal:     Palpations: Abdomen is soft.     Tenderness: There is no abdominal tenderness. There is no guarding or rebound.  Genitourinary:    Comments: Stool over pannus/labia.  No local erythema/edema Musculoskeletal:        General: No tenderness.  Skin:    General: Skin is warm and dry.  Neurological:     Mental Status: She is alert and oriented to person, place, and time.  Psychiatric:     Comments: Flat affect    ED Results / Procedures / Treatments   Labs (all labs ordered are listed, but only abnormal results are displayed) Labs Reviewed  CBC WITH DIFFERENTIAL/PLATELET - Abnormal; Notable for the following components:      Result Value   WBC 11.2 (*)    RBC 5.19 (*)    MCH 25.0 (*)    MCHC 29.6 (*)    RDW 16.5 (*)    Platelets 455 (*)    Eosinophils Absolute 0.7 (*)    All other components within normal limits  BASIC METABOLIC PANEL - Abnormal; Notable for the following components:   Potassium 3.2 (*)    Creatinine, Ser 1.19 (*)    GFR, Estimated 58 (*)    All other components within normal limits  URINALYSIS, ROUTINE W REFLEX MICROSCOPIC    EKG None  Radiology No results found.  Procedures Procedures   Medications Ordered in ED Medications - No data to display  ED Course  I have reviewed the triage vital signs and the nursing notes.  Pertinent labs  & imaging results that were available during my care of the patient were reviewed by me and considered in my medical decision making (see chart for details).    MDM Rules/Calculators/A&P                          patient here for evaluation of Foley catheter discomfort, recently had catheter placed for urinary retention and urinary tract infection. This is only been put in place for a couple of days and she has already had one ED visit for discomfort due to the catheter being in place. Will discontinue the catheter with plan to have her follow-up with urology as needed. Cultures from recent hospitalization reviewed, no significant bacteria grew out in culture. Will not continue antibiotics at this time. Will re-send cultures and only treat if patient develops symptomatology consistent with acute UTI. No evidence of sepsis or acute kidney injury.  Final Clinical Impression(s) / ED Diagnoses Final diagnoses:  None    Rx / DC Orders ED Discharge Orders     None        Quintella Reichert, MD 03/05/21 [phone]

## 2021-03-05 NOTE — Discharge Instructions (Signed)
Take your flomax as prescribed

## 2021-03-05 NOTE — ED Notes (Signed)
[facility] called for transport to motel

## 2021-03-05 NOTE — ED Notes (Signed)
Patient resting quietly at this time.  Updated on plan of care and pending discharge.  Patient reports she will need assistance getting back to hotel.  PT[facility] called

## 2021-03-05 NOTE — ED Notes (Signed)
Patient cleaned with soap and water.  Reports she was unable to clean herself after having a bowel movement.

## 2021-03-06 ENCOUNTER — Telehealth (HOSPITAL_COMMUNITY): Payer: Self-pay | Admitting: Licensed Clinical Social Worker

## 2021-03-06 ENCOUNTER — Ambulatory Visit (HOSPITAL_COMMUNITY): Payer: 59 | Admitting: Licensed Clinical Social Worker

## 2021-03-06 NOTE — Telephone Encounter (Signed)
LCSW sent text message with link for video session per schedule. When pt failed to sign on LCSW called pt. Call went directly into vm. LCSW left detailed vm re purpose of call with phone # to call back if interested in rescheduling.

## 2021-03-07 LAB — URINE CULTURE: Culture: >=100000 — AB

## 2021-03-08 ENCOUNTER — Telehealth: Payer: Self-pay

## 2021-03-08 NOTE — Telephone Encounter (Signed)
Post ED Visit - Positive Culture Follow-up: Unsuccessful Patient Follow-up  Culture assessed and recommendations reviewed by:  []  Elenor Quinones, Pharm.D. []  Heide Guile, Pharm.D., BCPS AQ-ID []  Parks Neptune, Pharm.D., BCPS []  Alycia Rossetti, Pharm.D., BCPS []  [place], [place].D., BCPS, AAHIVP []  Legrand Como, Pharm.D., BCPS, AAHIVP []  Wynell Balloon, PharmD []  Vincenza Hews, PharmD, BCPS  Positive urine culture  [x]  Patient discharged without antimicrobial prescription and treatment is now indicated []  Organism is resistant to prescribed ED discharge antimicrobial []  Patient with positive blood cultures   Unable to contact patient after 3 attempts, letter will be sent to address on file  Heather [name] 03/08/2021, 12:14 PM

## 2021-03-08 NOTE — Progress Notes (Signed)
ED Antimicrobial Stewardship Positive Culture Follow Up   Heather [name] is an 45 y.o. female who presented to [facility] on 03/04/2021 with a chief complaint of No chief complaint on file.   Recent Results (from the past 720 hour(s))  Resp Panel by RT-PCR (Flu A&B, Covid) Nasopharyngeal Swab     Status: None   Collection Time: 02/07/21 10:52 PM   Specimen: Nasopharyngeal Swab; Nasopharyngeal(NP) swabs in vial transport medium  Result Value Ref Range Status   SARS Coronavirus 2 by RT PCR NEGATIVE NEGATIVE Final    Comment: (NOTE) SARS-CoV-2 target nucleic acids are NOT DETECTED.  The SARS-CoV-2 RNA is generally detectable in upper respiratory specimens during the acute phase of infection. The lowest concentration of SARS-CoV-2 viral copies this assay can detect is 138 copies/mL. A negative result does not preclude SARS-Cov-2 infection and should not be used as the sole basis for treatment or other patient management decisions. A negative result may occur with  improper specimen collection/handling, submission of specimen other than nasopharyngeal swab, presence of viral mutation(s) within the areas targeted by this assay, and inadequate number of viral copies(<138 copies/mL). A negative result must be combined with clinical observations, patient history, and epidemiological information. The expected result is Negative.  Fact Sheet for Patients:  EntrepreneurPulse.com.au  Fact Sheet for Healthcare Providers:  IncredibleEmployment.be  This test is no t yet approved or cleared by the [place] FDA and  has been authorized for detection and/or diagnosis of SARS-CoV-2 by FDA under an Emergency Use Authorization (EUA). This EUA will remain  in effect (meaning this test can be used) for the duration of the COVID-19 declaration under Section 564(b)(1) of the Act, 21 U.S.C.section 360bbb-3(b)(1), unless the authorization is terminated  or  revoked sooner.       Influenza A by PCR NEGATIVE NEGATIVE Final   Influenza B by PCR NEGATIVE NEGATIVE Final    Comment: (NOTE) The Xpert Xpress SARS-CoV-2/FLU/RSV plus assay is intended as an aid in the diagnosis of influenza from Nasopharyngeal swab specimens and should not be used as a sole basis for treatment. Nasal washings and aspirates are unacceptable for Xpert Xpress SARS-CoV-2/FLU/RSV testing.  Fact Sheet for Patients: EntrepreneurPulse.com.au  Fact Sheet for Healthcare Providers: IncredibleEmployment.be  This test is not yet approved or cleared by the [place] FDA and has been authorized for detection and/or diagnosis of SARS-CoV-2 by FDA under an Emergency Use Authorization (EUA). This EUA will remain in effect (meaning this test can be used) for the duration of the COVID-19 declaration under Section 564(b)(1) of the Act, 21 U.S.C. section 360bbb-3(b)(1), unless the authorization is terminated or revoked.  Performed at [facility], [place] [address], [place], [place] [address]   Urine Culture     Status: Abnormal   Collection Time: 02/08/21  1:20 AM   Specimen: Urine, Clean Catch  Result Value Ref Range Status   Specimen Description URINE, CLEAN CATCH  Final   Special Requests   Final    NONE Performed at [facility], [place] [address], [place],  [address]    Culture MULTIPLE SPECIES PRESENT, SUGGEST RECOLLECTION (A)  Final   Report Status 02/08/2021 FINAL  Final  Resp Panel by RT-PCR (Flu A&B, Covid) Nasopharyngeal Swab     Status: None   Collection Time: 02/15/21 11:23 PM   Specimen: Nasopharyngeal Swab; Nasopharyngeal(NP) swabs in vial transport medium  Result Value Ref Range Status   SARS Coronavirus 2 by RT PCR NEGATIVE NEGATIVE  Final    Comment: (NOTE) SARS-CoV-2 target nucleic acids are NOT DETECTED.  The SARS-CoV-2 RNA is generally detectable in upper respiratory specimens during the acute  phase of infection. The lowest concentration of SARS-CoV-2 viral copies this assay can detect is 138 copies/mL. A negative result does not preclude SARS-Cov-2 infection and should not be used as the sole basis for treatment or other patient management decisions. A negative result may occur with  improper specimen collection/handling, submission of specimen other than nasopharyngeal swab, presence of viral mutation(s) within the areas targeted by this assay, and inadequate number of viral copies(<138 copies/mL). A negative result must be combined with clinical observations, patient history, and epidemiological information. The expected result is Negative.  Fact Sheet for Patients:  EntrepreneurPulse.com.au  Fact Sheet for Healthcare Providers:  IncredibleEmployment.be  This test is no t yet approved or cleared by the [place] FDA and  has been authorized for detection and/or diagnosis of SARS-CoV-2 by FDA under an Emergency Use Authorization (EUA). This EUA will remain  in effect (meaning this test can be used) for the duration of the COVID-19 declaration under Section 564(b)(1) of the Act, 21 U.S.C.section 360bbb-3(b)(1), unless the authorization is terminated  or revoked sooner.       Influenza A by PCR NEGATIVE NEGATIVE Final   Influenza B by PCR NEGATIVE NEGATIVE Final    Comment: (NOTE) The Xpert Xpress SARS-CoV-2/FLU/RSV plus assay is intended as an aid in the diagnosis of influenza from Nasopharyngeal swab specimens and should not be used as a sole basis for treatment. Nasal washings and aspirates are unacceptable for Xpert Xpress SARS-CoV-2/FLU/RSV testing.  Fact Sheet for Patients: EntrepreneurPulse.com.au  Fact Sheet for Healthcare Providers: IncredibleEmployment.be  This test is not yet approved or cleared by the [place] FDA and has been authorized for detection and/or diagnosis of  SARS-CoV-2 by FDA under an Emergency Use Authorization (EUA). This EUA will remain in effect (meaning this test can be used) for the duration of the COVID-19 declaration under Section 564(b)(1) of the Act, 21 U.S.C. section 360bbb-3(b)(1), unless the authorization is terminated or revoked.  Performed at [facility], [place] [address] [place], [place] [address]   Urine Culture     Status: Abnormal   Collection Time: 02/26/21  4:24 AM   Specimen: In/Out Cath Urine  Result Value Ref Range Status   Specimen Description   Final    IN/OUT CATH URINE Performed at [facility], [address], [place], [place] [address]    Special Requests   Final    NONE Performed at [facility], [place], [place] [address]    Culture (A)  Final    20,000 COLONIES/mL ENTEROCOCCUS FAECIUM 20,000 COLONIES/mL ENTEROBACTER CLOACAE VANCOMYCIN RESISTANT ENTEROCOCCUS    Report Status 03/01/2021 FINAL  Final   Organism ID, Bacteria ENTEROCOCCUS FAECIUM (A)  Final   Organism ID, Bacteria ENTEROBACTER CLOACAE (A)  Final      Susceptibility   Enterobacter cloacae - MIC*    CEFAZOLIN >=64 RESISTANT Resistant     CEFEPIME 0.25 SENSITIVE Sensitive     CIPROFLOXACIN 2 RESISTANT Resistant     GENTAMICIN <=1 SENSITIVE Sensitive     IMIPENEM 1 SENSITIVE Sensitive     NITROFURANTOIN <=16 SENSITIVE Sensitive     TRIMETH/SULFA >=320 RESISTANT Resistant     PIP/TAZO 64 INTERMEDIATE Intermediate     * 20,000 COLONIES/mL ENTEROBACTER CLOACAE   Enterococcus faecium - MIC*    AMPICILLIN >=32 RESISTANT  Resistant     NITROFURANTOIN 64 INTERMEDIATE Intermediate     VANCOMYCIN >=32 RESISTANT Resistant     LINEZOLID 2 SENSITIVE Sensitive     * 20,000 COLONIES/mL ENTEROCOCCUS FAECIUM  Resp Panel by RT-PCR (Flu A&B, Covid) Nasopharyngeal Swab     Status: None   Collection Time: 02/26/21  8:14 AM   Specimen: Nasopharyngeal Swab; Nasopharyngeal(NP)  swabs in vial transport medium  Result Value Ref Range Status   SARS Coronavirus 2 by RT PCR NEGATIVE NEGATIVE Final    Comment: (NOTE) SARS-CoV-2 target nucleic acids are NOT DETECTED.  The SARS-CoV-2 RNA is generally detectable in upper respiratory specimens during the acute phase of infection. The lowest concentration of SARS-CoV-2 viral copies this assay can detect is 138 copies/mL. A negative result does not preclude SARS-Cov-2 infection and should not be used as the sole basis for treatment or other patient management decisions. A negative result may occur with  improper specimen collection/handling, submission of specimen other than nasopharyngeal swab, presence of viral mutation(s) within the areas targeted by this assay, and inadequate number of viral copies(<138 copies/mL). A negative result must be combined with clinical observations, patient history, and epidemiological information. The expected result is Negative.  Fact Sheet for Patients:  EntrepreneurPulse.com.au  Fact Sheet for Healthcare Providers:  IncredibleEmployment.be  This test is no t yet approved or cleared by the [place] FDA and  has been authorized for detection and/or diagnosis of SARS-CoV-2 by FDA under an Emergency Use Authorization (EUA). This EUA will remain  in effect (meaning this test can be used) for the duration of the COVID-19 declaration under Section 564(b)(1) of the Act, 21 U.S.C.section 360bbb-3(b)(1), unless the authorization is terminated  or revoked sooner.       Influenza A by PCR NEGATIVE NEGATIVE Final   Influenza B by PCR NEGATIVE NEGATIVE Final    Comment: (NOTE) The Xpert Xpress SARS-CoV-2/FLU/RSV plus assay is intended as an aid in the diagnosis of influenza from Nasopharyngeal swab specimens and should not be used as a sole basis for treatment. Nasal washings and aspirates are unacceptable for Xpert Xpress  SARS-CoV-2/FLU/RSV testing.  Fact Sheet for Patients: EntrepreneurPulse.com.au  Fact Sheet for Healthcare Providers: IncredibleEmployment.be  This test is not yet approved or cleared by the [place] FDA and has been authorized for detection and/or diagnosis of SARS-CoV-2 by FDA under an Emergency Use Authorization (EUA). This EUA will remain in effect (meaning this test can be used) for the duration of the COVID-19 declaration under Section 564(b)(1) of the Act, 21 U.S.C. section 360bbb-3(b)(1), unless the authorization is terminated or revoked.  Performed at [facility], [address] [place], [place] [address]   Urine Culture     Status: Abnormal   Collection Time: 03/05/21  2:05 AM   Specimen: Urine, Catheterized  Result Value Ref Range Status   Specimen Description URINE, CATHETERIZED  Final   Special Requests   Final    NONE Performed at [facility], [place] [address] [place], [place] [address]    Culture >=100,000 COLONIES/mL ENTEROBACTER CLOACAE (A)  Final   Report Status 03/07/2021 FINAL  Final   Organism ID, Bacteria ENTEROBACTER CLOACAE (A)  Final      Susceptibility   Enterobacter cloacae - MIC*    CEFAZOLIN >=64 RESISTANT Resistant     CEFEPIME <=0.12 SENSITIVE Sensitive     CIPROFLOXACIN 2 RESISTANT Resistant     GENTAMICIN <=1 SENSITIVE Sensitive     IMIPENEM 1 SENSITIVE Sensitive  NITROFURANTOIN <=16 SENSITIVE Sensitive     TRIMETH/SULFA >=320 RESISTANT Resistant     PIP/TAZO 8 SENSITIVE Sensitive     * >=100,000 COLONIES/mL ENTEROBACTER CLOACAE   Presented with continued foley catheter foley pain - had previous foley exchange 5 days earlier. Previous Ucx prior to exchange grew 20k enterococcus faecium and clocae which didn't require antibiotics. During ED encounter, replaced foley catheter and had plan for urology follow-up - was going to monitor if Ucx.  [x]  Patient discharged  originally without antimicrobial agent and treatment is now indicated  Antibiotic Plan: Wellness check to see if any continued UTI symptoms and/or if already followed up with urology - if symptoms order Macrobid 100 mg twice daily for 5 days.   ED Provider: Morton Amy, PA   Antonietta Jewel, PharmD, BCCCP Clinical Pharmacist  Monday - Friday phone -  [phone] Saturday - Sunday phone - [phone]

## 2021-03-13 ENCOUNTER — Other Ambulatory Visit: Payer: Self-pay | Admitting: *Deleted

## 2021-03-13 ENCOUNTER — Encounter: Payer: Self-pay | Admitting: *Deleted

## 2021-03-13 DIAGNOSIS — R030 Elevated blood-pressure reading, without diagnosis of hypertension: Secondary | ICD-10-CM

## 2021-03-13 NOTE — Patient Outreach (Addendum)
[facility]) Care Management  [facility] Care Manager  03/13/2021   Ninoshka Wainwright Ertel 22-Oct-1975 [id number]  Spoke with pt today and enrolled into the [facility] program and available services. Pt agreed to the care plan discussed and receptive to [facility] packet to be mailed to her address on file.  Encounter Medications:  Outpatient Encounter Medications as of 03/13/2021  Medication Sig Note   albuterol (VENTOLIN HFA) 108 (90 Base) MCG/ACT inhaler Inhale into the lungs every 6 (six) hours as needed for wheezing or shortness of breath.    ascorbic acid (VITAMIN C) 500 MG tablet Take 1 tablet (500 mg total) by mouth daily.    diclofenac Sodium (VOLTAREN) 1 % GEL Apply 4 g topically 4 (four) times daily. (Patient taking differently: Apply 4 g topically 3 (three) times daily. Apply to knees and lower back)    FLUoxetine (PROZAC) 10 MG capsule Take 30 mg by mouth every morning.    fluticasone (FLONASE) 50 MCG/ACT nasal spray Place 2 sprays into both nostrils 2 (two) times daily. (Patient taking differently: Place 2 sprays into both nostrils 2 (two) times daily as needed for allergies or rhinitis.)    fluticasone furoate-vilanterol (BREO ELLIPTA) 100-25 MCG/ACT AEPB Inhale 1 puff into the lungs every morning.    furosemide (LASIX) 40 MG tablet Take 1 tablet (40 mg total) by mouth daily.    Melatonin 10 MG TABS Take 10 mg by mouth daily as needed (sleep).    methocarbamol (ROBAXIN) 500 MG tablet Take 1 tablet (500 mg total) by mouth 4 (four) times daily. (Patient taking differently: Take 500 mg by mouth every 6 (six) hours as needed for muscle spasms.)    nystatin (MYCOSTATIN/NYSTOP) powder Apply topically 3 (three) times daily. (Patient taking differently: Apply 1 application topically 3 (three) times daily as needed (rash/itching).)    potassium chloride SA (KLOR-CON) 20 MEQ tablet Take 1 tablet (20 mEq total) by mouth daily.    pregabalin (LYRICA) 100 MG capsule Take 1 capsule (100 mg total)  by mouth 3 (three) times daily.    rOPINIRole (REQUIP) 4 MG tablet Take 1 tablet (4 mg total) by mouth at bedtime.    senna-docusate (SENOKOT-S) 8.6-50 MG tablet Take 2 tablets by mouth daily. (Patient taking differently: Take 2 tablets by mouth daily as needed (constipation).)    silver sulfADIAZINE (SILVADENE) 1 % cream Apply topically daily. (Patient taking differently: Apply 1 application topically daily as needed (wound care).)    vitamin B-12 (CYANOCOBALAMIN) 1000 MCG tablet Take 1 tablet (1,000 mcg total) by mouth daily.    ondansetron (ZOFRAN ODT) 4 MG disintegrating tablet Take 1 tablet (4 mg total) by mouth every 8 (eight) hours as needed for nausea or vomiting. (Patient not taking: Reported on 03/13/2021)    tamsulosin (FLOMAX) 0.4 MG CAPS capsule Take 1 capsule (0.4 mg total) by mouth daily after supper. (Patient not taking: Reported on 03/13/2021) 03/03/2021: Pt has not started this medication   Facility-Administered Encounter Medications as of 03/13/2021  Medication   [COMPLETED] ipratropium (ATROVENT) nebulizer solution 0.5 mg    Functional Status:  In your present state of health, do you have any difficulty performing the following activities: 03/13/2021 02/26/2021  Hearing? - N  Vision? - N  Difficulty concentrating or making decisions? - N  Walking or climbing stairs? - Y  Comment - -  Dressing or bathing? - N  Doing errands, shopping? - N  Conservation officer, nature and eating ? N -  Using the Toilet? N -  In the past six months, have you accidently leaked urine? N -  Do you have problems with loss of bowel control? N -  Managing your Medications? N -  Managing your Finances? N -  Housekeeping or managing your Housekeeping? N -  Some recent data might be hidden    Fall/Depression Screening: Fall Risk  03/13/2021 01/01/2017 12/05/2016  Falls in the past year? 1 No No  Number falls in past yr: 0 - -  Injury with Fall? 0 - -  Comment - - -  Risk for fall due to : Impaired  balance/gait - -  Follow up Education provided;Falls prevention discussed - -   PHQ 2/9 Scores 03/13/2021 06/11/2018 01/01/2017 12/05/2016 11/28/2016 08/19/2016 08/06/2016  PHQ - 2 Score 0 4 0 0 0 0 0  PHQ- 9 Score - 17 - - - - 3  Exception Documentation - Medical reason - - - - -  Some encounter information is confidential and restricted. Go to Review Flowsheets activity to see all data.    Assessment:   Care Plan Care Plan : RN Care Manager plan of care  Updates made by Tobi Bastos, RN since 03/13/2021 12:00 AM     Problem: Knowledge Deficit related to Falls and care coordination needs   Priority: High     Problem: Development plan of care for management of Fall prevention   Priority: High  Note:   Current Barriers:  Knowledge Deficits related to plan of care for management of Fall Prevention   RNCM Clinical Goal(s):  Patient will verbalize understanding of plan for management of Fall Prevention as evidenced by no fall related incidents take all medications exactly as prescribed and will call provider for medication related questions as evidenced by reporting no missed dosages. attend all scheduled medical appointments: Discussed medical appointment with the new provider Dr. Criss Rosales as evidenced by the upcoming appointment  Interventions: Inter-disciplinary care team collaboration (see longitudinal plan of care) Evaluation of current treatment plan related to  self management and patient's adherence to plan as established by provider   Falls Interventions:  (Status:  New goal.) Long Term Goal Reviewed medications and discussed potential side effects of medications such as dizziness and frequent urination Advised patient of importance of notifying provider of falls Assessed patients knowledge of fall risk prevention secondary to previously provided education Assessed working status of life alert bracelet and patient adherence  Patient Goals/Self-Care Activities: Take all  medications as prescribed Attend all scheduled provider appointments Perform all self care activities independently  Perform IADL's (shopping, preparing meals, housekeeping, managing finances) independently Call provider office for new concerns or questions   Follow Up Plan:  Telephone follow up appointment with care management team member scheduled for:  Dec 28,2022 The patient has been provided with contact information for the care management team and has been advised to call with any health related questions or concerns.      Raina Mina, RN Care Management Coordinator [place] Office [phone]

## 2021-03-13 NOTE — Patient Instructions (Signed)
Visit Information  Thank you for taking time to visit with me today. Please don't hesitate to contact me if I can be of assistance to you before our next scheduled telephone appointment.    

## 2021-03-14 NOTE — Addendum Note (Signed)
Addended by: Raina Mina D on: 03/14/2021 11:21 AM   Modules accepted: Orders

## 2021-03-16 NOTE — Telephone Encounter (Signed)
Patient received a letter from [facility] and called to follow up for Ed visit and Wellness center. Per patient still having some symptoms of back pain and chills but no fever. A Rx was called to [facility] @[phone]  -[id number]( Macrobid 100mg  2x daily) x5 days. Prescribe by  Britni HenderleyPA . Called pt back  @[phone]  and updated and instructed pt ,pharmacy don't deliver. Per patient will try to call a friend  or from church to pick up the medicine. Instructed patient as well if she feels worse just go to the Ed to be seen.

## 2021-03-27 ENCOUNTER — Telehealth: Payer: Self-pay | Admitting: *Deleted

## 2021-03-27 NOTE — Telephone Encounter (Signed)
° °  Telephone encounter was:  Unsuccessful.  03/27/2021 Name: Heather [name] MRN: [id number] DOB: 06-22-1975  Unsuccessful outbound call made today to assist with:   clothes  Outreach Attempt:  2nd Attempt  A HIPAA compliant voice message was left requesting a return call.  Instructed patient to call back at   Instructed patient to call back at [phone]  at their earliest convenience. Marland Kitchen  .[name], Care Management  [phone] [address] , [place] [address] Email : [email or web address]

## 2021-04-04 ENCOUNTER — Telehealth: Payer: Self-pay | Admitting: *Deleted

## 2021-04-04 ENCOUNTER — Other Ambulatory Visit: Payer: Self-pay | Admitting: *Deleted

## 2021-04-04 NOTE — Telephone Encounter (Signed)
° °  Telephone encounter was:  Successful.  04/04/2021 Name: [name] MRN: [id number] DOB: 02/11/1976  Heather [name] is a 45 y.o. year old female who is a primary care patient of Lucianne Lei, MD . The community resource team was consulted for assistance with  clothing needs  Care guide performed the following interventions: Patient provided with information about care guide support team and interviewed to confirm resource needs.  Follow Up Plan:  No further follow up planned at this time. The patient has been provided with needed resources.  [place], Care Management  [phone] [address] , [place] [address] Email : [email or web address]

## 2021-04-04 NOTE — Patient Outreach (Signed)
[facility]) Care Management  [facility] Care Manager  04/04/2021   Nanie Dunkleberger Hernon [birth year] [id number]  Case Closure  Spoke with pt today and received information that her providers will be in network for her new insurance with [employer]. Pt has lost some weight was 402 lbs not at 390 lbs. Pt denies any additional falls and always uses her assistive devices. Care plan will be closed due to ending of contract with [facility] patient population.   Will notify pt's primary provider of pt's disposition with [facility].  Encounter Medications:  Outpatient Encounter Medications as of 04/04/2021  Medication Sig   albuterol (VENTOLIN HFA) 108 (90 Base) MCG/ACT inhaler Inhale into the lungs every 6 (six) hours as needed for wheezing or shortness of breath.   ascorbic acid (VITAMIN C) 500 MG tablet Take 1 tablet (500 mg total) by mouth daily.   diclofenac Sodium (VOLTAREN) 1 % GEL Apply 4 g topically 4 (four) times daily. (Patient taking differently: Apply 4 g topically 3 (three) times daily. Apply to knees and lower back)   FLUoxetine (PROZAC) 10 MG capsule Take 30 mg by mouth every morning.   fluticasone (FLONASE) 50 MCG/ACT nasal spray Place 2 sprays into both nostrils 2 (two) times daily. (Patient taking differently: Place 2 sprays into both nostrils 2 (two) times daily as needed for allergies or rhinitis.)   fluticasone furoate-vilanterol (BREO ELLIPTA) 100-25 MCG/ACT AEPB Inhale 1 puff into the lungs every morning.   furosemide (LASIX) 40 MG tablet Take 1 tablet (40 mg total) by mouth daily.   Melatonin 10 MG TABS Take 10 mg by mouth daily as needed (sleep).   methocarbamol (ROBAXIN) 500 MG tablet Take 1 tablet (500 mg total) by mouth 4 (four) times daily. (Patient taking differently: Take 500 mg by mouth every 6 (six) hours as needed for muscle spasms.)   nystatin (MYCOSTATIN/NYSTOP) powder Apply topically 3 (three) times daily. (Patient taking differently: Apply 1  application topically 3 (three) times daily as needed (rash/itching).)   ondansetron (ZOFRAN ODT) 4 MG disintegrating tablet Take 1 tablet (4 mg total) by mouth every 8 (eight) hours as needed for nausea or vomiting. (Patient not taking: Reported on 03/13/2021)   potassium chloride SA (KLOR-CON) 20 MEQ tablet Take 1 tablet (20 mEq total) by mouth daily.   pregabalin (LYRICA) 100 MG capsule Take 1 capsule (100 mg total) by mouth 3 (three) times daily.   rOPINIRole (REQUIP) 4 MG tablet Take 1 tablet (4 mg total) by mouth at bedtime.   senna-docusate (SENOKOT-S) 8.6-50 MG tablet Take 2 tablets by mouth daily. (Patient taking differently: Take 2 tablets by mouth daily as needed (constipation).)   silver sulfADIAZINE (SILVADENE) 1 % cream Apply topically daily. (Patient taking differently: Apply 1 application topically daily as needed (wound care).)   vitamin B-12 (CYANOCOBALAMIN) 1000 MCG tablet Take 1 tablet (1,000 mcg total) by mouth daily.   Facility-Administered Encounter Medications as of 04/04/2021  Medication   [COMPLETED] ipratropium (ATROVENT) nebulizer solution 0.5 mg    Functional Status:  In your present state of health, do you have any difficulty performing the following activities: 03/13/2021 02/26/2021  Hearing? - N  Vision? - N  Difficulty concentrating or making decisions? - N  Walking or climbing stairs? - Y  Comment - -  Dressing or bathing? - N  Doing errands, shopping? - N  Conservation officer, nature and eating ? N -  Using the Toilet? N -  In the past six months, have you accidently leaked  urine? N -  Do you have problems with loss of bowel control? N -  Managing your Medications? N -  Managing your Finances? N -  Housekeeping or managing your Housekeeping? N -  Some recent data might be hidden    Fall/Depression Screening: Fall Risk  03/13/2021 01/01/2017 12/05/2016  Falls in the past year? 1 No No  Number falls in past yr: 0 - -  Injury with Fall? 0 - -  Comment - - -  Risk  for fall due to : Impaired balance/gait - -  Follow up Education provided;Falls prevention discussed - -   PHQ 2/9 Scores 03/13/2021 06/11/2018 01/01/2017 12/05/2016 11/28/2016 08/19/2016 08/06/2016  PHQ - 2 Score 0 4 0 0 0 0 0  PHQ- 9 Score - 17 - - - - 3  Exception Documentation - Medical reason - - - - -  Some encounter information is confidential and restricted. Go to Review Flowsheets activity to see all data.    Assessment:   Care Plan Care Plan : RN Care Manager plan of care  Updates made by Tobi Bastos, RN since 04/04/2021 12:00 AM     Problem: Knowledge Deficit related to Falls and care coordination needs Resolved 04/04/2021  Priority: High  Note:   Resolved today with closure to this case.     Problem: Development plan of care for management of Fall prevention Resolved 04/04/2021  Priority: High  Note:   Current Barriers:  Knowledge Deficits related to plan of care for management of Fall Prevention   RNCM Clinical Goal(s):  Patient will verbalize understanding of plan for management of Fall Prevention as evidenced by no fall related incidents take all medications exactly as prescribed and will call provider for medication related questions as evidenced by reporting no missed dosages. attend all scheduled medical appointments: Discussed medical appointment with the new provider Dr. Criss Rosales as evidenced by the upcoming appointment  Interventions: Inter-disciplinary care team collaboration (see longitudinal plan of care) Evaluation of current treatment plan related to  self management and patient's adherence to plan as established by provider   Falls Interventions:  (Status:  New goal.) Long Term Goal Reviewed medications and discussed potential side effects of medications such as dizziness and frequent urination Advised patient of importance of notifying provider of falls Assessed patients knowledge of fall risk prevention secondary to previously provided  education Assessed working status of life alert bracelet and patient adherence  Patient Goals/Self-Care Activities: Take all medications as prescribed Attend all scheduled provider appointments Perform all self care activities independently  Perform IADL's (shopping, preparing meals, housekeeping, managing finances) independently Call provider office for new concerns or questions   Follow Up Plan:  Telephone follow up appointment with care management team member scheduled for:  Dec 28,2022 The patient has been provided with contact information for the care management team and has been advised to call with any health related questions or concerns.    Resolving due to case closure.    Raina Mina, RN Care Management Coordinator [place] Office [phone]

## 2021-04-23 ENCOUNTER — Telehealth (HOSPITAL_COMMUNITY): Payer: Medicaid Other

## 2021-05-01 ENCOUNTER — Encounter (HOSPITAL_COMMUNITY): Payer: Self-pay | Admitting: Psychiatry

## 2021-05-01 ENCOUNTER — Telehealth (INDEPENDENT_AMBULATORY_CARE_PROVIDER_SITE_OTHER): Payer: Medicaid Other | Admitting: Psychiatry

## 2021-05-01 DIAGNOSIS — F411 Generalized anxiety disorder: Secondary | ICD-10-CM

## 2021-05-01 DIAGNOSIS — F4310 Post-traumatic stress disorder, unspecified: Secondary | ICD-10-CM

## 2021-05-01 DIAGNOSIS — F331 Major depressive disorder, recurrent, moderate: Secondary | ICD-10-CM

## 2021-05-01 MED ORDER — MELATONIN 10 MG PO TABS: 10.0000 mg | ORAL_TABLET | Freq: Every day | ORAL | 5 refills | Status: AC | PRN

## 2021-05-01 MED ORDER — FLUOXETINE HCL 10 MG PO CAPS: 30.0000 mg | ORAL_CAPSULE | Freq: Every morning | ORAL | 5 refills | Status: DC

## 2021-05-01 NOTE — Progress Notes (Signed)
[facility] OP Progress Note   Heather [name] MRN:  [id number] Date of Evaluation:  05/01/2021 Referral Source: self Chief Complaint:   Chief Complaint   Anxiety; Depression; Follow-up    Visit Diagnosis:    ICD-10-CM   1. Generalized anxiety disorder  F41.1     2. PTSD (post-traumatic stress disorder)  F43.10     3. Major depressive disorder, recurrent episode, moderate (HCC)  F33.1       History of Present Illness:   46 y.o. female diagnosed with major depressive disorder presenting with depression related to multiple stressors.  Currently, she is dealing with multiple medical issues and pending kidney surgery.  Moderate depression with no suicidal ideations.  Moderate anxiety, no panic attacks She was recently in the hospital and her medications were changed related to her kidney issues.  Sleep is good along with appetite.  No suicidal/homicidal ideations, hallucinations, or substance abuse.  Encouragement provided, therapy in place.  Past Psychiatric History: depression, PTSD, anxiety  Past Medical History:  Past Medical History:  Diagnosis Date   Anxiety    Arthritis    Asthma    Back pain    Constipation    COPD (chronic obstructive pulmonary disease) ([place])    Depression    Diabetes mellitus without complication ([place])    patient denies but states she has hyperglycemia-diet controlled   Dysrhythmia    DR Johnsie Cancel     Ectopic pregnancy 2979   Eosinophilic esophagitis    Diagnosed at [facility]/16/2013, untreated   GERD (gastroesophageal reflux disease)    HEARTBURN   TUMS   High cholesterol    IBS (irritable bowel syndrome)    Leukocytosis 07/28/2008   Qualifier: Diagnosis of  By: Jonna Munro MD, Cornelius     Morbid obesity ([place])    Neuromuscular disorder ([place])    RESTLESS LEG    Obesity    Schizoaffective disorder, bipolar type ([place])    Sleep apnea    CPAP- in process of restarting     Past Surgical History:  Procedure Laterality Date   BIOPSY  08/13/2018    Procedure: BIOPSY;  Surgeon: Yetta Flock, MD;  Location: [facility];  Service: Gastroenterology;;   CESAREAN SECTION MULTI-GESTATIONAL N/A 02/03/2017   Procedure: [place];  Surgeon: Jonnie Kind, MD;  Location: [facility];  Service: Obstetrics;  Laterality: N/A;   CHOLECYSTECTOMY     COLONOSCOPY WITH PROPOFOL N/A 08/13/2018   Procedure: COLONOSCOPY WITH PROPOFOL;  Surgeon: Yetta Flock, MD;  Location: [facility];  Service: Gastroenterology;  Laterality: N/A;   DENTAL SURGERY     ESOPHAGOGASTRODUODENOSCOPY  May 2007   Dr. Gala Romney: Normal esophagus, stomach, D1, D2   ESOPHAGOGASTRODUODENOSCOPY  06/16/2013   Dr. Carlton Adam, eosinophilic esophagitis, reactive gastropathy, no esophageal dilation   ESOPHAGOGASTRODUODENOSCOPY (EGD) WITH PROPOFOL N/A 08/13/2018   Procedure: ESOPHAGOGASTRODUODENOSCOPY (EGD) WITH PROPOFOL;  Surgeon: Yetta Flock, MD;  Location: [facility];  Service: Gastroenterology;  Laterality: N/A;   POLYPECTOMY  08/13/2018   Procedure: POLYPECTOMY;  Surgeon: Yetta Flock, MD;  Location: [facility];  Service: Gastroenterology;;   TONSILLECTOMY     TOOTH EXTRACTION  10/28/2011   Procedure: DENTAL RESTORATION/EXTRACTIONS;  Surgeon: Gae Bon, DDS;  Location: [facility];  Service: Oral Surgery;;   UPPER GASTROINTESTINAL ENDOSCOPY      Family Psychiatric History: see below  Family History:  Family History  Problem Relation Age of Onset   Depression Mother    Anxiety disorder Mother  High blood pressure Mother    Bipolar disorder Mother    Eating disorder Mother    Obesity Mother    Hypertension Sister    Allergic rhinitis Sister    Colon polyps Maternal Grandmother        53s   Diabetes Maternal Grandmother    Anxiety disorder Maternal Grandmother    COPD Maternal Grandmother    Crohn's disease Maternal Aunt    Cancer Maternal Grandfather        prostate   HIV/AIDS Father    Eating disorder Father     Obesity Father    Liver disease Neg Hx    Angioedema Neg Hx    Eczema Neg Hx    Immunodeficiency Neg Hx    Asthma Neg Hx    Urticaria Neg Hx    Colon cancer Neg Hx    Esophageal cancer Neg Hx    Rectal cancer Neg Hx    Stomach cancer Neg Hx     Social History:   Social History   Socioeconomic History   Marital status: Legally Separated    Spouse name: Gwyndolyn Saxon   Number of children: 0   Years of education: Not on file   Highest education level: Not on file  Occupational History   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 had a pack the other day  Other Topics Concern   Not on file  Social History Narrative   Not on file   Social Determinants of Health   Financial Resource Strain: Not on file  Food Insecurity: No Food Insecurity   Worried About Running Out of Food in the Last Year: Never true   Ran Out of Food in the Last Year: Never true  Transportation Needs: No Transportation Needs   Lack of Transportation (Medical): No   Lack of Transportation (Non-Medical): No  Physical Activity: Not on file  Stress: Not on file  Social Connections: Not on file    Additional Social History: lives in a hotel at this time   Allergies:   Allergies  Allergen Reactions   Amoxicillin Anaphylaxis   Bee Venom Shortness Of Breath and Swelling   Ciprofloxacin Anaphylaxis   Keflex [Cephalexin] Anaphylaxis   Penicillin G Anaphylaxis   Penicillins Anaphylaxis     Tolerates ceftriaxone Has patient had a PCN reaction causing immediate rash, facial/tongue/throat swelling, SOB or lightheadedness with hypotension: No Has patient had a PCN reaction causing severe rash involving mucus membranes or skin necrosis: No Has patient had a PCN reaction that required hospitalization No Has patient had a PCN reaction occurring within the last 10 years: No If all of the above answers are "NO", then may proceed with Cephalosporin use.'  REACTION: Angioedema   Adhesive [Tape] Itching and Rash   Latex Itching and Rash   Vancomycin Other (See Comments)    Pt can  tolerate Vancomycin but did cause Vancomycin Infusion Reaction.  Recommend to pre-medicate with Benadryl before doses administered.      Metabolic Disorder Labs: Lab Results  Component Value Date   HGBA1C 5.6 08/10/2020   MPG 114.02 08/10/2020   MPG 100 11/28/2016   No results found for: PROLACTIN Lab Results  Component Value Date   CHOL 115 06/11/2018   TRIG 120 06/11/2018   HDL 43 06/11/2018   CHOLHDL 3.9 Ratio 06/30/2008   VLDL 52 (H) 06/30/2008   LDLCALC 48 06/11/2018   LDLCALC 54 06/30/2008   Lab Results  Component Value Date   TSH 2.220 06/11/2018    Therapeutic Level Labs: Lab Results  Component Value Date   LITHIUM 0.47 (L) 06/30/2008   No results found for: CBMZ No results found for: VALPROATE  Current Medications: Current Outpatient Medications  Medication Sig Dispense Refill   albuterol (VENTOLIN HFA) 108 (90 Base) MCG/ACT inhaler Inhale into the lungs every 6 (six) hours as needed for wheezing or shortness of breath.     ascorbic acid (VITAMIN C) 500 MG tablet Take 1 tablet (500 mg total) by mouth daily. 30 tablet 2   diclofenac Sodium (VOLTAREN) 1 % GEL Apply 4 g topically 4 (four) times daily. (Patient taking differently: Apply 4 g topically 3 (three) times daily. Apply to knees and lower back) 100 g 2   FLUoxetine (PROZAC) 10 MG capsule Take 30 mg by mouth every morning.      fluticasone (FLONASE) 50 MCG/ACT nasal spray Place 2 sprays into both nostrils 2 (two) times daily. (Patient taking differently: Place 2 sprays into both nostrils 2 (two) times daily as needed for allergies or rhinitis.) 16 g 2   fluticasone furoate-vilanterol (BREO ELLIPTA) 100-25 MCG/ACT AEPB Inhale 1 puff into the lungs every morning.     furosemide (LASIX) 40 MG tablet Take 1 tablet (40 mg total) by mouth daily. 30 tablet 3   Melatonin 10 MG TABS Take 10 mg by mouth daily as needed (sleep).     methocarbamol (ROBAXIN) 500 MG tablet Take 1 tablet (500 mg total) by mouth 4 (four) times daily. (Patient taking differently: Take 500 mg by mouth every 6 (six) hours as needed for muscle spasms.) 120 tablet 2   nystatin (MYCOSTATIN/NYSTOP) powder Apply topically 3 (three) times daily. (Patient taking differently: Apply 1 application topically 3 (three) times daily as needed (rash/itching).) 15 g 0   ondansetron (ZOFRAN ODT) 4 MG disintegrating tablet Take 1 tablet (4 mg total) by mouth every 8 (eight) hours as needed for nausea or vomiting. (Patient not taking: Reported on 03/13/2021) 10 tablet 0   potassium chloride SA (KLOR-CON) 20 MEQ tablet Take 1 tablet (20 mEq total) by mouth daily. 30 tablet 3   pregabalin (LYRICA) 100 MG capsule Take 1 capsule (100 mg total) by mouth 3 (three) times daily. 90 capsule 2   rOPINIRole (REQUIP) 4 MG tablet Take 1 tablet (4 mg total) by mouth at bedtime. 30 tablet 2   senna-docusate (SENOKOT-S) 8.6-50 MG tablet Take 2 tablets by mouth daily. (Patient taking differently: Take 2 tablets by mouth daily as needed (constipation).) 60 tablet 0   silver sulfADIAZINE (SILVADENE) 1 % cream Apply topically daily. (Patient taking differently: Apply 1 application topically daily as needed (wound care).) 85 g 0   vitamin B-12 (CYANOCOBALAMIN) 1000 MCG tablet Take 1 tablet (1,000 mcg total) by mouth daily. 30 tablet 2   No current facility-administered medications for this visit.  Musculoskeletal: Strength & Muscle Tone: within normal limits Gait & Station: did not witness Patient leans: N/A  Psychiatric Specialty Exam: Review of Systems  Constitutional:  Positive for fatigue.  Musculoskeletal:  Positive for back pain.  Psychiatric/Behavioral:  Positive for dysphoric mood. The patient is nervous/anxious.   All other systems reviewed and are negative.  There were no vitals taken for this visit.There is no height or weight on file to calculate BMI.  General Appearance: Casual  Eye Contact:  Good  Speech:  Normal Rate  Volume:  Normal  Mood:  Anxious and Depressed  Affect:  Appropriate and Congruent  Thought Process:  Coherent and Descriptions of Associations: Intact  Orientation:  Full (Time, Place, and Person)  Thought Content:  WDL and Logical  Suicidal Thoughts:  No  Homicidal Thoughts:  No  Memory:  Immediate;   Good Recent;   Good Remote;   Good  Judgement:  Fair  Insight:  Fair  Psychomotor Activity:  Normal  Concentration:  Concentration: Good and Attention Span: Good  Recall:  Good  Fund of Knowledge:Good  Language: Good  Akathisia:  No  Handed:  Right  AIMS (if indicated):  NA  Assets:  Communication Skills Leisure Time Physical Health Resilience Social Support  ADL's:  Intact  Cognition: WNL  Sleep:  Good   Screenings: PHQ2-9    Flowsheet Row Patient Outreach Telephone from 03/13/2021 in [facility] Video Visit from 07/10/2020 in [facility] Counselor from 05/26/2020 in [facility] Counselor from 05/05/2020 in [facility] Office Visit from 06/11/2018 in [facility]  PHQ-2 Total Score 0 0 3 2 4   PHQ-9 Total Score -- 0 6 10 17       Flowsheet Row ED from 03/03/2021 in [facility] ED to Hosp-Admission (Discharged) from 02/26/2021 in [facility] ED from 02/15/2021 in [facility] DEPT  C-SSRS RISK CATEGORY No Risk No Risk High Risk       Assessment and Plan:  Major depressive disorder, recurrent, moderate & General anxiety disorder: -Continue Prozac 30 mg daily -Continue therapy  Insomnia: -Continue melatonin 10 mg daily at bedtime PRN  Virtual Visit via Video Note  I connected with Heather [name] on 05/01/21 at  9:00 AM EST by a video enabled telemedicine application and verified that I am speaking with the correct person using two identifiers.  Location: Patient: home Provider: home office   I discussed the limitations of evaluation and management by telemedicine and the availability of in person appointments. The patient expressed understanding and agreed to proceed.  Follow Up Instructions: Follow up in 6 months   I discussed the assessment and treatment plan with the patient. The patient was provided an opportunity to ask questions and all were answered. The patient agreed with the plan and demonstrated an understanding of the instructions.   The patient was advised to call back or seek an in-person evaluation if the symptoms worsen or if the condition fails to improve as anticipated.  I provided 20 minutes of non-face-to-face time during this encounter.   Waylan Boga, NP    Waylan Boga, NP 1/24/20239:07 AM

## 2021-05-14 ENCOUNTER — Ambulatory Visit (INDEPENDENT_AMBULATORY_CARE_PROVIDER_SITE_OTHER): Payer: Medicaid Other | Admitting: Licensed Clinical Social Worker

## 2021-05-14 DIAGNOSIS — F331 Major depressive disorder, recurrent, moderate: Secondary | ICD-10-CM

## 2021-05-14 NOTE — Progress Notes (Signed)
° °  THERAPIST PROGRESS NOTE   Virtual Visit via Video Note  I connected with Heather [name] on 05/14/21 at 10:00 AM EST by a video enabled telemedicine application and verified that I am speaking with the correct person using two identifiers.  Location: Patient: Heather [name], paid for through Jul 06, 2021 by [facility]  Provider: [facility]   I discussed the limitations of evaluation and management by telemedicine and the availability of in person appointments. The patient expressed understanding and agreed to proceed. I discussed the assessment and treatment plan with the patient. The patient was provided an opportunity to ask questions and all were answered. The patient agreed with the plan and demonstrated an understanding of the instructions.   The patient was advised to call back or seek an in-person evaluation if the symptoms worsen or if the condition fails to improve as anticipated.  I provided 40 minutes of non-face-to-face time during this encounter.  Participation Level: Active  Behavioral Response: CasualAlertAnxious, Depressed, and Dysphoric  Type of Therapy: Individual Therapy  Treatment Goals addressed: Communication: dep/anx/coping  Interventions: Other: additional assessment  Summary: Heather [name] is a 46 y.o. female who presents with hx of dep/anx.  Today patient logs on for video session.  CCA done November, 2022.  Heather [name] advises she has been in the hotel where she is staying since she left the hospital in November of last year.  Patient reports she is working with a Tourist information centre manager with [employer] to help her resolve her homelessness.  She states the hotel is paid for until March 31 and she does not know what will happen after that.  Heather [name] currently has no income.  She reports she lost her disability when she got married approximately 4 years ago.  She is in the process of trying to get her disability back.  She has been separated for 3 years and reports she would have a  divorce if she could afford it.  She has no contact with her former spouse.  Patient advises her twin girls are with their guardian.  She states she sees them through [facility] on a regular basis but struggles she is not able to care and provide for them.  Heather [name] states it was particularly difficult for her to cope with the holidays.  She states her mother is in poor health and somewhere in a hotel herself.  Heather [name]'s father is deceased.  She reports her biggest supporter is her pastor who is also her power of attorney.  She relies heavily on her faith to help her cope yet states she also wonders "why" God has allowed so much hardship in her life.  Patient states she continues to have significant problems with her kidneys and has recently been told a mass on one of her kidneys may be cancer.  Patient reports she is seeing a specialist on Thursday and advises she will likely be having surgery at [facility].  Patient endorses depression, anxiety and fear.  LCSW assisted patient to process thoughts/feelings.  Addressed coping.  Patient reports in addition to her faith she has online connections and relationships she maintains through her phone.Is connected with [facility]. She also is relieved by listening to music. LCSW provided encouragement, reviewed poc including scheduling prior to [place] of session. Pt states appreciation for care.   Suicidal/Homicidal: Nowithout intent/plan  Therapist Response: Pt receptive to care.  Plan: Return again for next avail appt.  Diagnosis: Axis I:  MDD, moderate  Hermine Messick, LCSW 05/14/2021

## 2021-06-27 ENCOUNTER — Other Ambulatory Visit (HOSPITAL_COMMUNITY): Payer: Self-pay

## 2021-06-29 ENCOUNTER — Other Ambulatory Visit (HOSPITAL_COMMUNITY): Payer: Self-pay

## 2021-07-09 ENCOUNTER — Emergency Department (HOSPITAL_COMMUNITY)
Admission: EM | Admit: 2021-07-09 | Discharge: 2021-07-10 | Disposition: A | Discharge status: Other Acute Inpatient Hospital | Payer: 59 | Attending: Emergency Medicine | Admitting: Emergency Medicine

## 2021-07-09 ENCOUNTER — Other Ambulatory Visit: Payer: Self-pay

## 2021-07-09 ENCOUNTER — Encounter (HOSPITAL_COMMUNITY): Payer: Self-pay

## 2021-07-09 DIAGNOSIS — R45851 Suicidal ideations: Secondary | ICD-10-CM | POA: Insufficient documentation

## 2021-07-09 DIAGNOSIS — F418 Other specified anxiety disorders: Secondary | ICD-10-CM | POA: Diagnosis present

## 2021-07-09 DIAGNOSIS — F332 Major depressive disorder, recurrent severe without psychotic features: Secondary | ICD-10-CM | POA: Insufficient documentation

## 2021-07-09 DIAGNOSIS — Z20822 Contact with and (suspected) exposure to covid-19: Secondary | ICD-10-CM | POA: Diagnosis not present

## 2021-07-09 DIAGNOSIS — F411 Generalized anxiety disorder: Secondary | ICD-10-CM | POA: Diagnosis present

## 2021-07-09 DIAGNOSIS — Z046 Encounter for general psychiatric examination, requested by authority: Secondary | ICD-10-CM | POA: Diagnosis present

## 2021-07-09 LAB — URINALYSIS, ROUTINE W REFLEX MICROSCOPIC
Bilirubin Urine: NEGATIVE
Glucose, UA: NEGATIVE mg/dL
Ketones, ur: NEGATIVE mg/dL
Nitrite: NEGATIVE
Protein, ur: NEGATIVE mg/dL
Specific Gravity, Urine: 1.013 (ref 1.005–1.030)
WBC, UA: >50 WBC/hpf — ABNORMAL HIGH (ref 0–5)
pH: 6 (ref 5.0–8.0)

## 2021-07-09 LAB — BASIC METABOLIC PANEL
Anion gap: 7 (ref 5–15)
BUN: 19 mg/dL (ref 6–20)
CO2: 26 mmol/L (ref 22–32)
Calcium: 9.4 mg/dL (ref 8.9–10.3)
Chloride: 103 mmol/L (ref 98–111)
Creatinine, Ser: 1.05 mg/dL — ABNORMAL HIGH (ref 0.44–1.00)
GFR, Estimated: >60 mL/min (ref >60)
Glucose, Bld: 98 mg/dL (ref 70–99)
Potassium: 4.3 mmol/L (ref 3.5–5.1)
Sodium: 136 mmol/L (ref 135–145)

## 2021-07-09 LAB — CBC WITH DIFFERENTIAL/PLATELET
Abs Immature Granulocytes: 0.11 10*3/uL — ABNORMAL HIGH (ref 0.00–0.07)
Basophils Absolute: 0 10*3/uL (ref 0.0–0.1)
Basophils Relative: 0 %
Eosinophils Absolute: 0.4 10*3/uL (ref 0.0–0.5)
Eosinophils Relative: 3 %
HCT: 32.3 % — ABNORMAL LOW (ref 36.0–46.0)
Hemoglobin: 9.7 g/dL — ABNORMAL LOW (ref 12.0–15.0)
Immature Granulocytes: 1 %
Lymphocytes Relative: 19 %
Lymphs Abs: 2.3 10*3/uL (ref 0.7–4.0)
MCH: 24.3 pg — ABNORMAL LOW (ref 26.0–34.0)
MCHC: 30 g/dL (ref 30.0–36.0)
MCV: 81 fL (ref 80.0–100.0)
Monocytes Absolute: 0.6 10*3/uL (ref 0.1–1.0)
Monocytes Relative: 5 %
Neutro Abs: 8.9 10*3/uL — ABNORMAL HIGH (ref 1.7–7.7)
Neutrophils Relative %: 72 %
Platelets: 431 10*3/uL — ABNORMAL HIGH (ref 150–400)
RBC: 3.99 MIL/uL (ref 3.87–5.11)
RDW: 18.7 % — ABNORMAL HIGH (ref 11.5–15.5)
WBC: 12.3 10*3/uL — ABNORMAL HIGH (ref 4.0–10.5)
nRBC: 0 % (ref 0.0–0.2)

## 2021-07-09 LAB — RESP PANEL BY RT-PCR (FLU A&B, COVID) ARPGX2
Influenza A by PCR: NEGATIVE
Influenza B by PCR: NEGATIVE
SARS Coronavirus 2 by RT PCR: NEGATIVE

## 2021-07-09 LAB — I-STAT BETA HCG BLOOD, ED (MC, WL, AP ONLY): I-stat hCG, quantitative: <5 m[IU]/mL (ref <5)

## 2021-07-09 MED ORDER — DOXYCYCLINE HYCLATE 100 MG PO TABS
100.0000 mg | ORAL_TABLET | Freq: Two times a day (BID) | ORAL | Status: DC
  Administered 2021-07-09 – 2021-07-10 (×3): 100 mg via ORAL
  Filled 2021-07-09 (×4): qty 1

## 2021-07-09 MED ORDER — CYCLOBENZAPRINE HCL 10 MG PO TABS
10.0000 mg | ORAL_TABLET | Freq: Once | ORAL | Status: AC
  Administered 2021-07-09: 10 mg via ORAL
  Filled 2021-07-09: qty 1

## 2021-07-09 MED ORDER — FLUOXETINE HCL 20 MG PO CAPS
20.0000 mg | ORAL_CAPSULE | Freq: Every day | ORAL | Status: DC
  Administered 2021-07-09 – 2021-07-10 (×2): 20 mg via ORAL
  Filled 2021-07-09 (×2): qty 1

## 2021-07-09 NOTE — Progress Notes (Addendum)
Pt was accepted to [facility] tomorrow 07/10/21 after 9am; [place] ? ?Pt meets inpatient criteria per Shalon Bobbitt,NP ? ?Attending Physician will be Dr. Merrie Roof ? ?Report can be called to: [phone] ? ?Pt can arrive after 9:00am ? ?Nursing notified: Alvino Chapel, RN and confirmed that oncoming RN will be notified to call report and coordinate transport. ? ?Nadara Mode, LCSWA ?07/09/2021 @ 11:38 PM ? ?

## 2021-07-09 NOTE — ED Notes (Signed)
Pt remains calm and cooperative 

## 2021-07-09 NOTE — ED Triage Notes (Signed)
Pt with a lot of stressors recently. Had thoughts of giving up today. No plan. Called helpline ?

## 2021-07-09 NOTE — ED Notes (Signed)
Patient has  extra large gowns on. Patient unable to fit scrubs we have at this time. ?

## 2021-07-09 NOTE — Progress Notes (Signed)
Inpatient Behavioral Health Placement ? ?Pt meets inpatient criteria per Quintella Reichert, NP. CSW requested that [place], RN review referral.  Referral was sent to the following facilities;  ? ?Destination ?Service Provider Address Phone Fax  ?[place] [place] [address] [phone]  ?[facility]  [address] [place] [address] [phone]  ?[facility]  [place], [place] [address] [phone]  ?[facility] [address] [place] [address] [phone]  ?[place]  [place], [place] [address] [phone]  ?[place] [address] [place] [address] [phone]  ?[facility]  [address] [place] [address] [phone]  ?[place]  [address], [place] [address] [phone]  ?[facility]  [address] [place] [address] [phone]  ?[facility]  [address] [place] [address] [phone]  ?[place]  [address] [place] [address] [phone]  ?[place] [address] [place] [address] [phone]  ?[facility]  [address], [place] [address] [phone]  ?[facility]  [place], [place] [address] [phone]  ?[facility]  [place], [place] [address] [phone]  ?[facility]  [address] [place] [address] [phone]  ? ? ?Situation ongoing,  CSW will follow  up. ? ? ?Benjaman Kindler, MSW, LCSWA ?07/09/2021  @ 10:34 PM ? ?

## 2021-07-09 NOTE — ED Provider Notes (Signed)
?[place] DEPT ?[facility] Emergency Department ?Provider Note ?MRN:  [id number]  ?Arrival date & time: 07/09/21    ? ?Chief Complaint   ?Suicidal (Pt reports SI with no plan) ?  ?History of Present Illness   ?Heather [name] is a 46 y.o. year-old female presents to the ED with chief complaint of feeling hopeless and suicidal.  She doesn't have a clear plan, but called the crisis hotline and was referred to the ER.  Reports having a lot of stressors at home.  States that she gets chronic UTIs and kidney infections and is scheduled for surgery at [facility] later this month.   ? ? ? ? ?Review of Systems  ?Pertinent review of systems noted in HPI.  ? ? ?Physical Exam  ? ?Vitals:  ? 07/09/21 0201  ?BP: (!) 162/94  ?Pulse: (!) 115  ?Resp: 18  ?Temp: 98.9 ?F (37.2 ?C)  ?SpO2: 96%  ?  ?CONSTITUTIONAL:  well-appearing, NAD ?NEURO:  Alert and oriented x 3, CN 3-12 grossly intact ?EYES:  eyes equal and reactive ?ENT/NECK:  Supple, no stridor  ?CARDIO:  tachycardic, regular rhythm, appears well-perfused  ?PULM:  No respiratory distress,  ?GI/GU:  non-distended, no focal abdominal tenderness ?MSK/SPINE:  No gross deformities, no edema, moves all extremities  ?SKIN:  no rash, atraumatic ? ? ?*Additional and/or pertinent findings included in MDM below ? ?Diagnostic and Interventional Summary  ? ? ?Labs Reviewed  ?URINALYSIS, ROUTINE W REFLEX MICROSCOPIC - Abnormal; Notable for the following components:  ?    Result Value  ? APPearance CLOUDY (*)   ? Hgb urine dipstick SMALL (*)   ? Leukocytes,Ua LARGE (*)   ? WBC, UA >50 (*)   ? Bacteria, UA FEW (*)   ? All other components within normal limits  ?RESP PANEL BY RT-PCR (FLU A&B, COVID) ARPGX2  ?CBC WITH DIFFERENTIAL/PLATELET  ?BASIC METABOLIC PANEL  ?I-STAT BETA HCG BLOOD, ED ([facility], [facility], [facility] ONLY)  ?  ?No orders to display  ?  ?Medications  ?cyclobenzaprine (FLEXERIL) tablet 10 mg (has no administration in time range)  ?  ? ?Procedures  /  Critical Care ?Procedures ? ?ED  Course and Medical Decision Making  ?I have reviewed the triage vital signs, the nursing notes, and pertinent available records from the EMR. ? ?Complexity of Problems Addressed: ?High Complexity: Acute illness/injury posing a threat to life or bodily function, requiring emergent diagnostic workup, evaluation, and treatment as below. ?Comorbidities affecting this illness/injury include: ?Depression ?Social Determinants Affecting Care: ?Complexity of care is increased due to housing instability. ? ? ?ED Course: ?After considering the following differential, SI, I ordered labs and TTS consult. ?I personally interpreted the labs which are notable for UA of similar findings compared to recent visits.  This is a known issue and the patient states that she is scheduled for surgery at the end of the month.  I'll treat the patient with Doxy as she has many allergies, but has taken doxy for this before . ? ? ? ?  ? ?Consultants: ?I consulted with TTS, who recommends inpatient treatment. ? ?Treatment and Plan: ? ? ?Psychiatry recommends inpatient treatment.  [facility] at capacity.  Working on finding placemen ? ?Patient's exam and diagnostic results are concerning for severe depression and suicidality.  Feel that patient will need behavioral health admission to the hospital for further treatment and evaluation. ? ? ? ?Final Clinical Impressions(s) / ED Diagnoses  ?No diagnosis found.  ?ED Discharge Orders   ? ? None  ? ?  ?  ? ? ?  Discharge Instructions Discussed with and Provided to Patient:  ? ?Discharge Instructions   ?None ?  ? ?  ?Montine Circle, PA-C ?07/09/21 2122 ? ?  ?Delora Fuel, MD ?48/25/00 [phone] ? ?
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
## 2021-07-10 NOTE — ED Provider Notes (Signed)
Emergency Medicine Observation Re-evaluation Note ? ?Heather [name] is a 46 y.o. female, seen on rounds today.  Pt initially presented to the ED for complaints of Suicidal (Pt reports SI with no plan) ?Currently, the patient is alert, calm, sitting upright in her room. ? ?Physical Exam  ?BP 128/68 (BP Location: Right Wrist)   Pulse 93   Temp 98.6 ?F (37 ?C) (Oral)   Resp 18   Ht '5\' 5"'$  (1.651 m)   SpO2 99%   BMI 69.89 kg/m?  ?Physical Exam ?General: NAD ?Cardiac: well perfused ?Lungs: even and unlabored ?Psych: No agitation ? ?ED Course / MDM  ?EKG:  ? ?I have reviewed the labs performed to date as well as medications administered while in observation.  Recent changes in the last 24 hours include Pt accepted to [facility]. EMTALA completed. ? ?Plan  ?Current plan is for inpatient admission. ? Heather [name] is not under involuntary commitment. ? ? ?  ?Regan Lemming, MD ?07/10/21 [phone] ? ?

## 2021-07-10 NOTE — ED Notes (Signed)
Patient has asitter ?

## 2021-07-10 NOTE — ED Notes (Signed)
Attempted to call report, unable to speak with a nurse.  ?

## 2021-07-10 NOTE — ED Notes (Signed)
Pt urinated on self, pt now showering, bed linen changed. ?

## 2021-07-19 ENCOUNTER — Ambulatory Visit (HOSPITAL_COMMUNITY): Payer: Medicaid Other | Admitting: Licensed Clinical Social Worker

## 2021-07-19 ENCOUNTER — Other Ambulatory Visit (HOSPITAL_COMMUNITY): Payer: Self-pay

## 2021-07-19 MED ORDER — HYDROXYZINE PAMOATE 25 MG PO CAPS
ORAL_CAPSULE | ORAL | 0 refills | Status: DC
  Filled 2021-07-19: qty 60, 15d supply, fill #0

## 2021-07-19 MED ORDER — TIZANIDINE HCL 2 MG PO TABS
ORAL_TABLET | ORAL | 0 refills | Status: AC
  Filled 2021-07-19: qty 21, 7d supply, fill #0

## 2021-07-19 MED ORDER — DULOXETINE HCL 30 MG PO CPEP
ORAL_CAPSULE | ORAL | 0 refills | Status: DC
  Filled 2021-07-19: qty 45, 15d supply, fill #0

## 2021-07-19 MED ORDER — GABAPENTIN 100 MG PO CAPS
ORAL_CAPSULE | ORAL | 0 refills | Status: DC
  Filled 2021-07-19: qty 45, 15d supply, fill #0

## 2021-07-19 MED ORDER — MONTELUKAST SODIUM 10 MG PO TABS
ORAL_TABLET | ORAL | 0 refills | Status: AC
  Filled 2021-07-19: qty 15, 15d supply, fill #0

## 2021-07-19 MED ORDER — DOXYCYCLINE HYCLATE 100 MG PO TABS
ORAL_TABLET | ORAL | 0 refills | Status: DC
  Filled 2021-07-19: qty 6, 3d supply, fill #0

## 2021-07-19 MED ORDER — CLONAZEPAM 0.5 MG PO TABS
ORAL_TABLET | ORAL | 0 refills | Status: DC
  Filled 2021-07-19: qty 14, 7d supply, fill #0

## 2021-07-19 MED ORDER — ARIPIPRAZOLE 5 MG PO TABS
ORAL_TABLET | ORAL | 0 refills | Status: DC
  Filled 2021-07-19: qty 15, 15d supply, fill #0

## 2021-07-19 MED ORDER — TRAMADOL HCL 50 MG PO TABS
ORAL_TABLET | ORAL | 0 refills | Status: DC
  Filled 2021-07-19: qty 14, 7d supply, fill #0

## 2021-07-20 ENCOUNTER — Other Ambulatory Visit (HOSPITAL_COMMUNITY): Payer: Self-pay

## 2021-07-23 ENCOUNTER — Ambulatory Visit (INDEPENDENT_AMBULATORY_CARE_PROVIDER_SITE_OTHER): Payer: 59 | Admitting: Licensed Clinical Social Worker

## 2021-07-23 DIAGNOSIS — F332 Major depressive disorder, recurrent severe without psychotic features: Secondary | ICD-10-CM

## 2021-07-23 DIAGNOSIS — F331 Major depressive disorder, recurrent, moderate: Secondary | ICD-10-CM | POA: Diagnosis not present

## 2021-07-23 NOTE — Progress Notes (Addendum)
Virtual Visit via Video Note ? ?I connected with Heather [name] on 07/24/21 at  3:00 PM EDT by a video enabled telemedicine application and verified that I am speaking with the correct person using two identifiers. ? ?Location: ?Patient: pt's motel room ?Provider: clinical office ?  ?I discussed the limitations of evaluation and management by telemedicine and the availability of in person appointments. The patient expressed understanding and agreed to proceed. ?  ?I discussed the assessment and treatment plan with the patient. The patient was provided an opportunity to ask questions and all were answered. The patient agreed with the plan and demonstrated an understanding of the instructions. ?  ?The patient was advised to call back or seek an in-person evaluation if the symptoms worsen or if the condition fails to improve as anticipated. ? ?I provided 43 minutes of non-face-to-face time during this encounter. ? ? ?Heron Nay, LCSWA ? ? ? ?THERAPIST PROGRESS NOTE ? ?Session Time: 43 minutes ? ?Participation Level: Active ? ?Behavioral Response: CasualAlertAnxious and Depressed ? ?Type of Therapy: Individual Therapy ? ?Treatment Goals addressed: depression, coping ? ?ProgressTowards Goals: Initial ? ?Interventions: Solution Focused, Strength-based, and Supportive ? ?Summary: Heather [name] is a 46 y.o. female who presents for initial visit with this cln due to her previous therapist resigning. She reports she at [facility] for two weeks. States she got sick in May 2022, had a big fall out of her bed and had significant injuries to her legs and been walking with a walker ever since. States she did not get tx until two days after the fall and then found out about kidney issues. Discusses her stressors, stating her husband left, she lost her full time job, her mother moved to nursing home, she was evicted while in the hospital for her fall, and her children were removed from her by DSS. She states her 67yo  children have a guardian that is about to be given temporary custody. She reports her mother reported her to DSS. "I lost everything." Reports [facility] has placed her in a motel since November, but she has to move out on 4/30. Also reports on 06/09/2021 that she was seen by a urologist and has a large mass on her kidney, as well as a large kidney stone in her other kidney. Has been referred to ACT team but has not heard from one yet. She states while hospitalized last year, it was determined that she meets criteria for an assisted living facility but has not managed to secure placement. Cln discussed PHP to assist with coping and pt agrees to CCA. Pt confirms she has a care coordinator with Heather [name] and Heather [name] is coming to see her on Wednesday to assist with housing. ? ?Suicidal/Homicidal: Nowithout intent/plan ? ?Therapist Response: Cln introduced self and asked pt to identify pertinent background information, symptoms, and stressors. Cln oriented pt to PHP due to recent inpt tx and scheduled CCA for PHP on 4/17. Cln suggested to pt that she contact care coordinator with [facility] to inquire about admission to an assisted living facility and/or other services. Cln assessed for safety and pt denied SI. Cln confirmed availability and preferred method of service delivery for next appointment and informed office staff. ? ?Plan: Return again in 4 weeks. ? ?Diagnosis: Major depressive disorder, recurrent episode, moderate (HCC) ? ?Collaboration of Care: Other referral to PHP ? ?Patient/Guardian was advised Release of Information must be obtained prior to any record release in order to collaborate their care with an  outside provider. Patient/Guardian was advised if they have not already done so to contact the registration department to sign all necessary forms in order for [place] to release information regarding their care.  ? ?Consent: Patient/Guardian gives verbal consent for treatment and assignment of benefits for services  provided during this visit. Patient/Guardian expressed understanding and agreed to proceed.  ? ?Heron Nay, LCSWA ?07/24/2021 ? ?

## 2021-07-24 ENCOUNTER — Other Ambulatory Visit (HOSPITAL_COMMUNITY): Payer: Self-pay

## 2021-07-24 ENCOUNTER — Telehealth (HOSPITAL_COMMUNITY): Payer: Self-pay | Admitting: Licensed Clinical Social Worker

## 2021-07-24 ENCOUNTER — Ambulatory Visit (HOSPITAL_COMMUNITY): Payer: Medicaid Other | Admitting: Licensed Clinical Social Worker

## 2021-07-24 ENCOUNTER — Telehealth (INDEPENDENT_AMBULATORY_CARE_PROVIDER_SITE_OTHER): Payer: 59 | Admitting: Psychiatry

## 2021-07-24 ENCOUNTER — Encounter (HOSPITAL_COMMUNITY): Payer: Self-pay | Admitting: Psychiatry

## 2021-07-24 DIAGNOSIS — F4310 Post-traumatic stress disorder, unspecified: Secondary | ICD-10-CM | POA: Diagnosis not present

## 2021-07-24 DIAGNOSIS — F331 Major depressive disorder, recurrent, moderate: Secondary | ICD-10-CM

## 2021-07-24 MED ORDER — CLONAZEPAM 0.5 MG PO TABS
0.5000 mg | ORAL_TABLET | Freq: Two times a day (BID) | ORAL | 0 refills | Status: DC
  Filled 2021-07-24: qty 60, 30d supply, fill #0

## 2021-07-24 MED ORDER — ARIPIPRAZOLE 5 MG PO TABS
5.0000 mg | ORAL_TABLET | Freq: Every day | ORAL | 0 refills | Status: AC
  Filled 2021-07-24: qty 180, 180d supply, fill #0

## 2021-07-24 MED ORDER — DULOXETINE HCL 30 MG PO CPEP
90.0000 mg | ORAL_CAPSULE | Freq: Every day | ORAL | 0 refills | Status: AC
  Filled 2021-07-24: qty 540, 180d supply, fill #0

## 2021-07-24 MED ORDER — HYDROXYZINE HCL 25 MG PO TABS
25.0000 mg | ORAL_TABLET | Freq: Four times a day (QID) | ORAL | 0 refills | Status: AC | PRN
  Filled 2021-07-24: qty 360, 90d supply, fill #0

## 2021-07-24 NOTE — Psych (Signed)
Virtual Visit via Video Note ? ?I connected with [name] on 07/24/21 at 12:30 PM EDT by a video enabled telemedicine application and verified that I am speaking with the correct person using two identifiers. ? ?Location: ?Patient: pt's motel room ?Provider: clinical home office ?  ?I discussed the limitations of evaluation and management by telemedicine and the availability of in person appointments. The patient expressed understanding and agreed to proceed. ?  ?I discussed the assessment and treatment plan with the patient. The patient was provided an opportunity to ask questions and all were answered. The patient agreed with the plan and demonstrated an understanding of the instructions. ?  ?The patient was advised to call back or seek an in-person evaluation if the symptoms worsen or if the condition fails to improve as anticipated. ? ?I provided 60 minutes of non-face-to-face time during this encounter. ? ? ?Heather [name], LCSWA ? ? ?Comprehensive Clinical Assessment (CCA) Note ? ?07/24/2021 ?Heather [name] ?[id number] ? ?Chief Complaint:  ?Chief Complaint  ?Patient presents with  ? Depression  ? Anxiety  ? ?Visit Diagnosis: MDD, moderate  ? ? ?CCA Screening, Triage and Referral (STR) ? ?Patient Reported Information ?How did you hear about [place]? Other (Comment) ? ?Referral name: [facility] therapist ? ?Referral phone number: No data recorded ? ?Whom do you see for routine medical problems? Primary Care ? ?Practice/Facility Name: Dr. Freddi Starr ? ?Practice/Facility Phone Number: No data recorded ?Name of Contact: No data recorded ?Contact Number: No data recorded ?Contact Fax Number: No data recorded ?Prescriber Name: No data recorded ?Prescriber Address (if known): No data recorded ? ?What Is the Reason for Your Visit/Call Today? worsening depression ? ?How Long Has This Been Causing You Problems? > than 6 months ? ?What Do You Feel Would Help You the Most Today? Treatment for Depression or other mood  problem ? ? ?Have You Recently Been in Any Inpatient Treatment (Hospital/Detox/Crisis Center/28-Day Program)? Yes ? ?Name/Location of Program/Hospital:[facility] ? ?How Long Were You There? 2 weeks ? ?When Were You Discharged? 07/19/21 ? ? ?Have You Ever Received Services From [facility] Before? Yes ? ?Who Do You See at [facility]? No data recorded ? ?Have You Recently Had Any Thoughts About Hurting Yourself? No ? ?Are You Planning to Commit Suicide/Harm Yourself At This time? No ? ? ?Have you Recently Had Thoughts About [place]? No ? ?Explanation: No data recorded ? ?Have You Used Any Alcohol or Drugs in the Past 24 Hours? No ? ?How Long Ago Did You Use Drugs or Alcohol? No data recorded ?What Did You Use and How Much? Reports she ordered door dash and it?s a possibility someone put cocaine in her food. ? ? ?Do You Currently Have a Therapist/Psychiatrist? Yes ? ?Name of Therapist/Psychiatrist: Pt has outpatient services through [facility] Sammie Bench, NP and [facility] Manners, [place] ? ? ?Have You Been Recently Discharged From Any Office Practice or Programs? No ? ?Explanation of Discharge From Practice/Program: No data recorded ? ?  ?CCA Screening Triage Referral Assessment ?Type of Contact: Tele-Assessment ? ?Is this Initial or Reassessment? Initial Assessment ? ?Date Telepsych consult ordered in [facility]:  07/09/21 ? ?Time Telepsych consult ordered in [facility]:  0237 ? ? ?Patient Reported Information Reviewed? No data recorded ?Patient Left Without Being Seen? No data recorded ?Reason for Not Completing Assessment: No data recorded ? ?Collateral Involvement: chart review ? ? ?Does Patient Have a Stage manager Guardian? No data recorded ?Name and Contact of  Legal Guardian: No data recorded ?If Minor and Not Living with Parent(s), Who has Custody? NA ? ?Is CPS involved or ever been involved? Currently (pt's 44yo twins currently in DSS custody. Pt was also removed from her mother's care as an  adolescent.) ? ?Is APS involved or ever been involved? Never ? ? ?Patient Determined To Be At Risk for Harm To Self or Others Based on Review of Patient Reported Information or Presenting Complaint? No ? ?Method: No data recorded ?Availability of Means: No data recorded ?Intent: No data recorded ?Notification Required: No data recorded ?Additional Information for Danger to Others Potential: No data recorded ?Additional Comments for Danger to Others Potential: No data recorded ?Are There Guns or Other Weapons in [place]? No data recorded ?Types of Guns/Weapons: No data recorded ?Are These Weapons Safely Secured?                            No data recorded ?Who Could Verify You Are Able To Have These Secured: No data recorded ?Do You Have any Outstanding Charges, Pending Court Dates, Parole/Probation? No data recorded ?Contacted To Inform of Risk of Harm To Self or Others: Unable to Contact: ? ? ?Location of Assessment: Other (comment) (bh outpt) ? ? ?Does Patient Present under Involuntary Commitment? No ? ?IVC Papers Initial File Date: No data recorded ? ?[place] of Residence: Heather [name] ? ? ?Patient Currently Receiving the Following Services: Medication Management; Individual Therapy ? ? ?Determination of Need: Routine (7 days) ? ? ?Options For Referral: Outpatient Therapy; Medication Management; ALF/SNF ? ? ? ? ?CCA Biopsychosocial ?Intake/Chief Complaint:  Heather [name] is a 46yo female referred to [facility] by this cln after seeing pt in outpatient therapy. She cites her stressors as current homelessness, her children in DSS custody and her mother being the one that reported pt to DSS, physical health conditions, financial difficulties, lack of transportation, and limited support. She states her pastor is her only current support, though she sees her mother regularly virtually. At this time, she is living in a motel room that she has access to until the end of April and has no current options for housing. She reports  requiring an ALF, as determined during a previous hospitalization, but has faced barriers in placement. She reports two previous psychiatric hospitalizations, one at [facility] in April 2013, and was d/c from [place] on 07/19/2021 after a two-week admission. Per chart review, she has had at least two other psychiatric admissions since 2005. She receives med man with Waylan Boga, NP at [facility]. She endorses four total past suicide attempts, three as an adult and one as an adolescent while living in a PRTF. Her current diagnoses are MDD, GAD, and PTSD. She reports a previous diagnosis of schizoaffective disorder and endorses some auditory hallucinations of indescernible whispering, last occurring prior to [place] admission. She denies HI and current substance use. She endorses a history of NSSIB as an adolescent and previous alcohol, THC, and cocaine abuse, sober since 2010. She denies current SI, last experienced at time of admission to [place]. She reports her mother has bipolar disorder and recently experienced a psychotic episode, and her sister has ADHD. She has numerous physical health conditions that greatly impact her mobility and ability to care for herself. Denies access to firearms. At this time, pt does not meet criteria for PHP. Pt agreeable to continuing in outpt and cln agrees to f/u with care coordinator for ACT team/ALF. ? ?Current  Symptoms/Problems: tearfulness ? ? ?Patient Reported Schizophrenia/Schizoaffective Diagnosis in Past: Yes (schizoaffective disorder) ? ? ?Strengths: motivated for tx ? ?Preferences: no stated preferences ? ?Abilities: able to engage in tx ? ? ?Type of Services Patient Feels are Needed: open to PHP ? ? ?Initial Clinical Notes/Concerns: pt has limited mobility and resources ? ? ?Mental Health Symptoms ?Depression:   ?Difficulty Concentrating; Fatigue; Increase/decrease in appetite; Irritability; Sleep (too much or little); Worthlessness; Hopelessness; Tearfulness; Weight gain/loss; Change  in energy/activity ?  ?Duration of Depressive symptoms:  ?Greater than two weeks ?  ?Mania:   ?None ?  ?Anxiety:    ?Worrying; Irritability; Fatigue; Tension; Sleep; Difficulty concentrating ?  ?Psychosis:

## 2021-07-24 NOTE — Progress Notes (Signed)
Heather [name] OP Progress Note  ? ?Heather [name] ?MRN:  [id number] ?Date of Evaluation:  07/24/2021 ?Referral Source: self ?Chief Complaint:   ?Chief Complaint   ?Anxiety; Depression; Follow-up ?  ? ?Visit Diagnosis:  ?  ICD-10-CM   ?1. Major depressive disorder, recurrent episode, moderate (HCC)  F33.1   ?  ?2. PTSD (post-traumatic stress disorder)  F43.10   ?  ? ? ?History of Present Illness:   ?46 y.o. female diagnosed with major depressive disorder presenting with depression related to multiple stressors.  Currently, she is dealing with multiple medical issues and pending kidney surgery.  Mild depression with no suicidal ideations.  She had "an emotional break down" recently and went to [facility].  She feels her medications that were adjusted there are doing well for her with no side effects. Mild anxiety, no panic attack.  Sleep is good along with appetite.  She is excited that she has lost almost 100 pounds in the past year and almost can walk without her walker.  No suicidal/homicidal ideations, hallucinations, [name] substance abuse.  Encouragement provided, therapy in place. ? ?Past Psychiatric History: depression, PTSD, anxiety ? ?Past Medical History:  ?Past Medical History:  ?Diagnosis Date  ? Anxiety   ? Arthritis   ? Asthma   ? Back pain   ? Constipation   ? COPD (chronic obstructive pulmonary disease) (Heather [name])   ? Depression   ? Diabetes mellitus without complication (Heather [name])   ? patient denies but states she has hyperglycemia-diet controlled  ? Dysrhythmia   ? DR Johnsie Cancel    ? Ectopic pregnancy 2013  ? Eosinophilic esophagitis   ? Diagnosed at [name] 06/16/2013, untreated  ? GERD (gastroesophageal reflux disease)   ? HEARTBURN   TUMS  ? High cholesterol   ? IBS (irritable bowel syndrome)   ? Leukocytosis 07/28/2008  ? Qualifier: Diagnosis of  By: Heather Munro MD, Heather [name]    ? Morbid obesity (Heather [name])   ? Neuromuscular disorder (Heather [name])   ? RESTLESS LEG   ? Obesity   ? Schizoaffective disorder, bipolar type (Heather [name])   ? Sleep  apnea   ? CPAP- in process of restarting   ?  ?Past Surgical History:  ?Procedure Laterality Date  ? BIOPSY  08/13/2018  ? Procedure: BIOPSY;  Surgeon: Heather Flock, MD;  Location: [name];  Service: Gastroenterology;;  ? CESAREAN SECTION MULTI-GESTATIONAL N/A 02/03/2017  ? Procedure: CESAREAN SECTION MULTI-GESTATIONAL;  Surgeon: Heather Kind, MD;  Location: [place];  Service: Obstetrics;  Laterality: N/A;  ? CHOLECYSTECTOMY    ? COLONOSCOPY WITH PROPOFOL N/A 08/13/2018  ? Procedure: COLONOSCOPY WITH PROPOFOL;  Surgeon: Heather Flock, MD;  Location: [name];  Service: Gastroenterology;  Laterality: N/A;  ? DENTAL SURGERY    ? ESOPHAGOGASTRODUODENOSCOPY  May 2007  ? Dr. Gala [name]: Normal esophagus, stomach, D1, D2  ? ESOPHAGOGASTRODUODENOSCOPY  06/16/2013  ? Dr. Carlton [name], eosinophilic esophagitis, reactive gastropathy, no esophageal dilation  ? ESOPHAGOGASTRODUODENOSCOPY (EGD) WITH PROPOFOL N/A 08/13/2018  ? Procedure: ESOPHAGOGASTRODUODENOSCOPY (EGD) WITH PROPOFOL;  Surgeon: Heather Flock, MD;  Location: [name];  Service: Gastroenterology;  Laterality: N/A;  ? POLYPECTOMY  08/13/2018  ? Procedure: POLYPECTOMY;  Surgeon: Heather Flock, MD;  Location: [name];  Service: Gastroenterology;;  ? TONSILLECTOMY    ? TOOTH EXTRACTION  10/28/2011  ? Procedure: DENTAL RESTORATION/EXTRACTIONS;  Surgeon: Heather [name], DDS;  Location: [name];  Service: Oral Surgery;;  ? UPPER GASTROINTESTINAL [name]    ? ? ?Family Psychiatric History: see  below ? ?Family History:  ?Family History  ?Problem Relation Age of Onset  ? Depression Mother   ? Anxiety disorder Mother   ? High blood pressure Mother   ? Bipolar disorder Mother   ? Eating disorder Mother   ? Obesity Mother   ? Hypertension Sister   ? Allergic rhinitis Sister   ? Colon polyps Maternal Grandmother   ?     63s  ? Diabetes Maternal Grandmother   ? Anxiety disorder Maternal Grandmother   ? COPD Maternal Grandmother   ?  Crohn's disease Maternal Aunt   ? Cancer Maternal Grandfather   ?     prostate  ? HIV/AIDS Father   ? Eating disorder Father   ? Obesity Father   ? Liver disease Neg Hx   ? Angioedema Neg Hx   ? Eczema Neg Hx   ? Immunodeficiency Neg Hx   ? Asthma Neg Hx   ? Urticaria Neg Hx   ? Colon cancer Neg Hx   ? Esophageal cancer Neg Hx   ? Rectal cancer Neg Hx   ? Stomach cancer Neg Hx   ? ? ?Social History:   ?Social History  ? ?Socioeconomic History  ? Marital status: Legally Separated  ?  Spouse name: Heather [name]  ? Number of children: 0  ? Years of education: Not on file  ? Highest education level: Not on file  ?Occupational History  ? 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 Smoking status: Former  ?  Packs/day: 0.50  ?  Years: 8.00  ?  Pack years: 4.00  ?  Types: Cigarettes  ?  Quit date: 04/25/2011  ?  Years since quitting: 10.2  ? Smokeless tobacco: Never  ?Vaping Use  ? Vaping Use: Never used  ?Substance and Sexual Activity  ? Alcohol use: Not Currently  ?  Comment: occasional "relapse" of a wine cooler  ? Drug use: Not Currently  ?  Types: Marijuana  ? Sexual activity: Not Currently  ?  Birth control/protection: None  ?  Comment: stopped smoking in jan.. had a pack the other day  ?Other Topics Concern  ? Not on file  ?Social History Narrative  ? Not on file  ? ?Social Determinants of Health  ? ?Financial Resource Strain: Not on file  ?Food Insecurity: No Food Insecurity  ? Worried About Charity fundraiser in the Last Year: Never true  ? Ran Out of Food in the Last Year: Never true  ?Transportation Needs: No Transportation Needs  ? Lack of Transportation (Medical): No  ? Lack of Transportation (Non-Medical): No  ?Physical Activity: Not on file  ?Stress: Not on file  ?Social Connections: Not on file  ? ? ?Additional Social History: lives in a hotel at this time  ? ?Allergies:   ?Allergies  ?Allergen Reactions  ? Amoxicillin Anaphylaxis  ? Bee Venom Shortness Of Breath and Swelling   ? Ciprofloxacin Anaphylaxis  ? Keflex [Cephalexin] Anaphylaxis  ? Penicillin G Anaphylaxis  ? Penicillins Anaphylaxis  ?  Tolerates ceftriaxone ?Has patient had a PCN reaction causing immediate rash, facial/tongue/throat swelling, SOB [name] lightheadedness with hypotension: No ?Has patient had a PCN reaction causing severe rash involving mucus membranes [name] skin necrosis: No ?Has patient had a PCN reaction that required hospitalization No ?Has patient had a PCN reaction occurring within the last 10 years: No ?If all of the above answers are "NO", then may proceed with Cephalosporin use.' ? ?  REACTION: Angioedema  ? Adhesive [Tape] Itching and Rash  ? Latex Itching and Rash  ? Vancomycin Other (See Comments)  ?  Pt can tolerate Vancomycin but did cause Vancomycin Infusion Reaction.  Recommend to pre-medicate with Benadryl before doses administered.    ? ? ?Metabolic Disorder Labs: ?Lab Results  ?Component Value Date  ? HGBA1C 5.6 08/10/2020  ? MPG 114.02 08/10/2020  ? MPG 100 11/28/2016  ? ?No results found for: PROLACTIN ?Lab Results  ?Component Value Date  ? CHOL 115 06/11/2018  ? TRIG 120 06/11/2018  ? HDL 43 06/11/2018  ? CHOLHDL 3.9 Ratio 06/30/2008  ? VLDL 52 (H) 06/30/2008  ? [place] 48 06/11/2018  ? [place] 54 06/30/2008  ? ?Lab Results  ?Component Value Date  ? TSH 2.220 06/11/2018  ? ? ?Therapeutic Level Labs: ?Lab Results  ?Component Value Date  ? LITHIUM 0.47 (L) 06/30/2008  ? ?No results found for: CBMZ ?No results found for: VALPROATE ? ?Current Medications: ?Current Outpatient Medications  ?Medication Sig Dispense Refill  ? albuterol (VENTOLIN HFA) 108 (90 Base) MCG/ACT inhaler Inhale into the lungs every 6 (six) hours as needed for wheezing [name] shortness of breath.    ? ARIPiprazole (ABILIFY) 5 MG tablet Take 1 tablet by mouth at bedtime. 15 tablet 0  ? ascorbic acid (VITAMIN C) 500 MG tablet Take 1 tablet (500 mg total) by mouth daily. 30 tablet 2  ? clonazePAM (KLONOPIN) 0.5 MG tablet Take 1 tablet by  mouth twice a day for anxiety. Do not take within 2 hours of tramadol. 14 tablet 0  ? diclofenac Sodium (VOLTAREN) 1 % GEL Apply 4 g topically 4 (four) times daily. (Patient taking differently: Apply 4 g top

## 2021-08-09 ENCOUNTER — Other Ambulatory Visit (HOSPITAL_COMMUNITY): Payer: Self-pay

## 2021-08-09 ENCOUNTER — Ambulatory Visit (HOSPITAL_COMMUNITY): Payer: Medicaid Other | Admitting: Licensed Clinical Social Worker

## 2021-08-15 ENCOUNTER — Ambulatory Visit (INDEPENDENT_AMBULATORY_CARE_PROVIDER_SITE_OTHER): Payer: 59 | Admitting: Licensed Clinical Social Worker

## 2021-08-15 DIAGNOSIS — F331 Major depressive disorder, recurrent, moderate: Secondary | ICD-10-CM | POA: Diagnosis not present

## 2021-08-15 NOTE — Progress Notes (Signed)
Virtual Visit via Video Note ? ?I connected with Heather [name] on 08/15/21 at  4:00 PM EDT by a video enabled telemedicine application and verified that I am speaking with the correct person using two identifiers. ? ?Location: ?Patient: hospital room ?Provider: clinical office ?  ?I discussed the limitations of evaluation and management by telemedicine and the availability of in person appointments. The patient expressed understanding and agreed to proceed. ?  ?I discussed the assessment and treatment plan with the patient. The patient was provided an opportunity to ask questions and all were answered. The patient agreed with the plan and demonstrated an understanding of the instructions. ?  ?The patient was advised to call back or seek an in-person evaluation if the symptoms worsen or if the condition fails to improve as anticipated. ? ?I provided 45 minutes of non-face-to-face time during this encounter. ? ? ?Heather [name], LCSWA ? ? ? ?THERAPIST PROGRESS NOTE ? ?Session Time: 45 minutes ? ?Participation Level: Active ? ?Behavioral Response: DisheveledAlertAnxious and Depressed ? ?Type of Therapy: Individual Therapy ? ?Treatment Goals addressed: coping ? ?ProgressTowards Goals: Progressing ? ?Interventions: Strength-based, Supportive, and Reframing ? ?Summary: Heather [name] is a 46 y.o. female who presents for f/u with this cln. She arrives on time for session, though her entire face is not visible throughout the assessment, as she is currently admitted to [facility]. "My kidney flared up and I was really swollen from [place]." States she was admitted last Thursday and is waiting to be moved to an ALF in [place]. States she also had to sign over temporary custody of her 44yo twins, and states this is difficult. She reports that they visited her in the hospital and may be coming to visit again. She has a pre-op appointment next week and will be having surgery within a month to remove  one of her kidneys, which she states has a 9 cm mass on it that is most likely cancerous. She states her other kidney also has a large calculous mass on it and she is afraid that kidney could be damaged. She states the urologist needs to test the other kidney's functioning before proceeding with removing the cancerous one. She expresses anxiety and fear surrounding the upcoming procedures and discloses that she previously refused an ALF due to a previous traumatic experience when she was in her twenties. Pt also verbalizes that she has come to understand and accept that her children were removed from her custody and demonstrates significant progress in making this statement. In her previous session, she expressed anger and blame toward her mother for calling CPS. However, pt states she now understands that her mother did so out of love and to protect the children. Pt receptive to feedback from cln and confirms her availability for the next scheduled appointment. ? ?Suicidal/Homicidal: Nowithout intent/plan ? ?Therapist Response: Cln assessed for current stressors, symptoms, and safety since last session. Cln utilized active listening and validation to assist with processing. Cln praised pt's progress in her cognitions surrounding her circumstances and stressors and expressed excitement and relief for pt to be connected with an ALF. Cln discussed things that are within pt's control and verbalized optimism for the upcoming placement and procedures. Cln pointed out that the outcome will not be changed by focusing on the fear and possible poor outcomes now and challenged pt to intentionally focus on positive memories and to envision how her life could be changing for the better. Cln stated that pt  is finally getting the care and support she needs and expressed optimism that other areas to improve once she is able to heal properly. Cln confirmed next scheduled appt.  ? ?Plan: Return again in 1 week. ? ?Diagnosis: Major  depressive disorder, recurrent episode, moderate (HCC) ? ?Collaboration of Care: Other none required for this visit ? ?Patient/Guardian was advised Release of Information must be obtained prior to any record release in order to collaborate their care with an outside provider. Patient/Guardian was advised if they have not already done so to contact the registration department to sign all necessary forms in order for [place] to release information regarding their care.  ? ?Consent: Patient/Guardian gives verbal consent for treatment and assignment of benefits for services provided during this visit. Patient/Guardian expressed understanding and agreed to proceed.  ? ?Heather [name], LCSWA ?08/15/2021 ? ?

## 2021-08-17 ENCOUNTER — Other Ambulatory Visit (HOSPITAL_COMMUNITY): Payer: Self-pay

## 2021-08-17 MED ORDER — NITROFURANTOIN MONOHYD MACRO 100 MG PO CAPS
ORAL_CAPSULE | ORAL | 0 refills | Status: DC
  Filled 2021-08-17: qty 6, 3d supply, fill #0

## 2021-08-17 MED ORDER — ROPINIROLE HCL 2 MG PO TABS
ORAL_TABLET | ORAL | 0 refills | Status: DC
  Filled 2021-08-17: qty 30, 30d supply, fill #0

## 2021-08-17 MED ORDER — HYDROXYZINE PAMOATE 25 MG PO CAPS
ORAL_CAPSULE | ORAL | 0 refills | Status: DC
  Filled 2021-08-17: qty 30, 10d supply, fill #0

## 2021-08-22 ENCOUNTER — Ambulatory Visit (INDEPENDENT_AMBULATORY_CARE_PROVIDER_SITE_OTHER): Payer: 59 | Admitting: Licensed Clinical Social Worker

## 2021-08-22 DIAGNOSIS — F331 Major depressive disorder, recurrent, moderate: Secondary | ICD-10-CM

## 2021-08-22 NOTE — Progress Notes (Signed)
Virtual Visit via Video Note ? ?I connected with Ashawna Hanback Westfall on 08/22/21 at 11:00 AM EDT by a video enabled telemedicine application and verified that I am speaking with the correct person using two identifiers. ? ?Location: ?Patient: ALF in [place] ?Provider: clinical office ?  ?I discussed the limitations of evaluation and management by telemedicine and the availability of in person appointments. The patient expressed understanding and agreed to proceed. ?  ?I discussed the assessment and treatment plan with the patient. The patient was provided an opportunity to ask questions and all were answered. The patient agreed with the plan and demonstrated an understanding of the instructions. ?  ?The patient was advised to call back or seek an in-person evaluation if the symptoms worsen or if the condition fails to improve as anticipated. ? ?I provided 42 minutes of non-face-to-face time during this encounter. ? ? ?Heron Nay, LCSWA ? ? ? ?THERAPIST PROGRESS NOTE ? ?Session Time: 42 minutes ? ?Participation Level: Active ? ?Behavioral Response: Fairly GroomedAlertAnxious and Depressed ? ?Type of Therapy: Individual Therapy ? ?Treatment Goals addressed: coping ? ?ProgressTowards Goals: Progressing ? ?Interventions: Supportive ? ?Summary: Olamide Lahaie Gervasi is a 46 y.o. female who presents for f/u with this cln. She signs on at the scheduled time and maintains good eye contact. Surgery scheduled for 6/23. Got to see children on Mother's Day and states it went well. She reports her children are drinking out of bottles and expressed frustration with this. "They're not babies, they're almost 42 years old. They know how to drink out of cups." She also talks about feeling disappointed that she will not be in the ALF for 30 days, as she originally assumed. "It looks like I'm gonna be here long-term." She Pt states it would be most helpful to talk about how she feels rather than suggestions to improve her mood.  She describes the fear she feels regarding her surgery scheduled for 6/23. She expresses that she's scared that she'll have to have chemo and lose her hair and for her twin daughters Kyra Searles and Glenard Haring) to see her so sick. She shows cln recent pictures of her daughters and describes their legal guardian as very caring toward pt and her daughters.  ? ?Suicidal/Homicidal: Nowithout intent/plan ? ?Therapist Response: Cln assessed for current stressors, symptoms, and safety since last session. Cln utilized active listening and validation to assist with processing. Cln asked pt directly how pt could best be supported during this session and focused on validating rather than solutions. Cln verbalized some positives but also acknowledged the extreme stress pt is experiencing and the resulting feelings. Cln scheduled 3 f/u appointments and confirmed pt's availability and preferred method of service delivery (virtual). Lastly, cln added pt to cancellation list with the goal of seeing pt either the week before or the week of her surgery, and informed pt of same. ? ?Plan: Return again in 6 weeks (next available appt). ? ?Diagnosis: Major depressive disorder, recurrent episode, moderate (HCC) ? ?Collaboration of Care: Other none required for this visit ? ?Patient/Guardian was advised Release of Information must be obtained prior to any record release in order to collaborate their care with an outside provider. Patient/Guardian was advised if they have not already done so to contact the registration department to sign all necessary forms in order for [place] to release information regarding their care.  ? ?Consent: Patient/Guardian gives verbal consent for treatment and assignment of benefits for services provided during this visit. Patient/Guardian expressed understanding and  agreed to proceed.  ? ?Heron Nay, LCSWA ?08/22/2021 ? ?

## 2021-08-27 ENCOUNTER — Other Ambulatory Visit (HOSPITAL_COMMUNITY): Payer: Self-pay

## 2021-08-29 ENCOUNTER — Other Ambulatory Visit (HOSPITAL_COMMUNITY): Payer: Self-pay

## 2021-09-24 ENCOUNTER — Telehealth (HOSPITAL_COMMUNITY): Payer: Self-pay | Admitting: Licensed Clinical Social Worker

## 2021-09-24 NOTE — Telephone Encounter (Signed)
See call intake 

## 2021-10-01 ENCOUNTER — Other Ambulatory Visit (HOSPITAL_BASED_OUTPATIENT_CLINIC_OR_DEPARTMENT_OTHER): Payer: Self-pay

## 2021-10-01 ENCOUNTER — Ambulatory Visit (INDEPENDENT_AMBULATORY_CARE_PROVIDER_SITE_OTHER): Payer: 59 | Admitting: Licensed Clinical Social Worker

## 2021-10-01 DIAGNOSIS — F331 Major depressive disorder, recurrent, moderate: Secondary | ICD-10-CM

## 2021-10-01 MED ORDER — OXYBUTYNIN CHLORIDE 5 MG PO TABS
ORAL_TABLET | ORAL | 0 refills | Status: DC
  Filled 2021-10-01: qty 30, 10d supply, fill #0

## 2021-10-01 MED ORDER — NITROFURANTOIN MONOHYD MACRO 100 MG PO CAPS
ORAL_CAPSULE | ORAL | 0 refills | Status: DC
  Filled 2021-10-01: qty 14, 7d supply, fill #0

## 2021-10-08 ENCOUNTER — Ambulatory Visit (INDEPENDENT_AMBULATORY_CARE_PROVIDER_SITE_OTHER): Payer: 59 | Admitting: Licensed Clinical Social Worker

## 2021-10-08 DIAGNOSIS — F332 Major depressive disorder, recurrent severe without psychotic features: Secondary | ICD-10-CM | POA: Diagnosis not present

## 2021-10-08 DIAGNOSIS — F331 Major depressive disorder, recurrent, moderate: Secondary | ICD-10-CM

## 2021-10-08 NOTE — Progress Notes (Signed)
Virtual Visit via Video Note  I connected with Heather [name] on 10/08/21 at  3:00 PM EDT by a video enabled telemedicine application and verified that I am speaking with the correct person using two identifiers.  Location: Patient: assisted living facility Provider: clinical office   I discussed the limitations of evaluation and management by telemedicine and the availability of in person appointments. The patient expressed understanding and agreed to proceed.   I discussed the assessment and treatment plan with the patient. The patient was provided an opportunity to ask questions and all were answered. The patient agreed with the plan and demonstrated an understanding of the instructions.   The patient was advised to call back or seek an in-person evaluation if the symptoms worsen or if the condition fails to improve as anticipated.  I provided 21 minutes of non-face-to-face time during this encounter.   Heron Nay, LCSWA    THERAPIST PROGRESS NOTE  Session Time: 21 minutes  Participation Level: Active  Behavioral Response: CasualAlertDepressed  Type of Therapy: Individual Therapy  Treatment Goals addressed: coping  ProgressTowards Goals: Progressing  Interventions: Supportive  Summary: Heather [name] is a 46 y.o. female who presents for f/u with this cln. She joins the session on time and maintains good eye contact throughout. She states her mood has been good but she had a "little breakdown" last week. She reports her kidney levels weren't good, but the highest they've been. She states she wonders if she's strong enough to be able to take care of her kids. She feels sad due to the upcoming holiday and wants to be with her family. She reports feeling bored during the weekends due to there not being enough activities. She reports she is getting stronger from physical therapy. She reports her pain is about a "5" at this time. She states she is hoping to get some  coloring books and shares she used to enjoy "latch hooking." She reports she is scheduled for another surgery on 7/17 to remove more stones from her kidney and to possibly remove a stent that was placed. She denies having other concerns at this time and consents to end the session.  Suicidal/Homicidal: Nowithout intent/plan  Therapist Response: Cln assessed for current stressors, symptoms, and safety since last session. Cln utilized active listening and validation to assist with processing. Cln confirmed next scheduled appt.  Plan: Return again in 1 week.  Diagnosis: Major depressive disorder, recurrent episode, moderate ([name])  Collaboration of Care: Other none required for this visit  Patient/Guardian was advised Release of Information must be obtained prior to any record release in order to collaborate their care with an outside provider. Patient/Guardian was advised if they have not already done so to contact the registration department to sign all necessary forms in order for [place] to release information regarding their care.   Consent: Patient/Guardian gives verbal consent for treatment and assignment of benefits for services provided during this visit. Patient/Guardian expressed understanding and agreed to proceed.   Heron Nay, LCSWA 10/08/2021

## 2021-10-15 ENCOUNTER — Ambulatory Visit (INDEPENDENT_AMBULATORY_CARE_PROVIDER_SITE_OTHER): Payer: 59 | Admitting: Licensed Clinical Social Worker

## 2021-10-15 DIAGNOSIS — F3341 Major depressive disorder, recurrent, in partial remission: Secondary | ICD-10-CM

## 2021-10-15 NOTE — Progress Notes (Signed)
Virtual Visit via Video Note  I connected with Heather [name] on 10/15/21 at  4:00 PM EDT by a video enabled telemedicine application and verified that I am speaking with the correct person using two identifiers.  Location: Patient: pt's assisted living facility Provider: clinical home office   I discussed the limitations of evaluation and management by telemedicine and the availability of in person appointments. The patient expressed understanding and agreed to proceed.   I discussed the assessment and treatment plan with the patient. The patient was provided an opportunity to ask questions and all were answered. The patient agreed with the plan and demonstrated an understanding of the instructions.   The patient was advised to call back or seek an in-person evaluation if the symptoms worsen or if the condition fails to improve as anticipated.  I provided 17 minutes of non-face-to-face time during this encounter.   Heron Nay, LCSWA    THERAPIST PROGRESS NOTE  Session Time: 17 minutes  Participation Level: Active  Behavioral Response: CasualAlertEuthymic  Type of Therapy: Individual Therapy  Treatment Goals addressed: coping  ProgressTowards Goals: Progressing  Interventions: CBT and Supportive  Summary: Kortne All Dinkins is a 46 y.o. female who presents for f/u with this cln. She joins the session on time and maintains adequate eye contact throughout. She reports she is doing well. "I had an activity earlier. 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 Reports her mood has been "okay" and that she has been sleeping well. She also reports she has been socializing more with other residents. She states she was able to video chat with her daughters on 7/4. She states this went well. She also reports she is scheduled for her next surgery on 7/17 and states she is not sure how long she'll have to stay in the hospital. Per chart review, pt's surgery is scheduled  on 7/13 and pt opens mychart to confirm this. She states coloring is helpful to her when she is bored and/or experiencing anxious thoughts. She denies having any other concerns at this time and is agreeable to ending the session.  Suicidal/Homicidal: Nowithout intent/plan  Therapist Response: Cln assessed for current stressors, symptoms, and safety since last session. Cln utilized active listening and validation to assist with processing. Cln summarized pt's progress and advised pt to contact the office if she needs to be seen sooner than her next scheduled appt. Cln wished pt well on her surgery.  Plan: Return again in 4 weeks.  Diagnosis: MDD (major depressive disorder), recurrent, in partial remission ([place])  Collaboration of Care: Other none required for this visit  Patient/Guardian was advised Release of Information must be obtained prior to any record release in order to collaborate their care with an outside provider. Patient/Guardian was advised if they have not already done so to contact the registration department to sign all necessary forms in order for [place] to release information regarding their care.   Consent: Patient/Guardian gives verbal consent for treatment and assignment of benefits for services provided during this visit. Patient/Guardian expressed understanding and agreed to proceed.   Heron Nay, LCSWA 10/15/2021

## 2021-10-16 ENCOUNTER — Telehealth (HOSPITAL_COMMUNITY): Payer: Medicaid Other | Admitting: Psychiatry

## 2021-11-14 ENCOUNTER — Encounter (INDEPENDENT_AMBULATORY_CARE_PROVIDER_SITE_OTHER): Payer: Self-pay

## 2021-11-21 ENCOUNTER — Ambulatory Visit (INDEPENDENT_AMBULATORY_CARE_PROVIDER_SITE_OTHER): Payer: 59 | Admitting: Licensed Clinical Social Worker

## 2021-11-21 DIAGNOSIS — F331 Major depressive disorder, recurrent, moderate: Secondary | ICD-10-CM

## 2021-11-21 NOTE — Progress Notes (Signed)
Virtual Visit via Video Note  I connected with Heather [name] on 11/21/21 at  3:00 PM EDT by a video enabled telemedicine application and verified that I am speaking with the correct person using two identifiers.  Location: Patient: nursing facility Provider: clinical office   I discussed the limitations of evaluation and management by telemedicine and the availability of in person appointments. The patient expressed understanding and agreed to proceed.  I discussed the assessment and treatment plan with the patient. The patient was provided an opportunity to ask questions and all were answered. The patient agreed with the plan and demonstrated an understanding of the instructions.   The patient was advised to call back or seek an in-person evaluation if the symptoms worsen or if the condition fails to improve as anticipated.  I provided 42 minutes of non-face-to-face time during this encounter.   Heather [name], LCSWA    THERAPIST PROGRESS NOTE  Session Time: 42 minutes  Participation Level: Active  Behavioral Response: CasualAlertDepressed  Type of Therapy: Individual Therapy  Treatment Goals addressed: coping  ProgressTowards Goals: Progressing  Interventions: Supportive  Summary: Heather [name] is a 46 y.o. female who presents for f/u with this cln. She signs onto the session on time and maintains good eye contact throughout the session. She reports she just had her second surgery to have a stent placed and then had to go to the ED to have the stent removed. She states she is scheduled for a cystoscopy on 8/30. She reports it is still unclear whether or not her right kidney (with the mass) will have to be removed. She states she cried yesterday. "I guess it comes out of nowhere sometimes. I miss my kids." She reports she is engaging in rehab for her lower body and states it is going well. "I wish I could get out sometimes and get a break from going to the hospital.  It would be nice to do something fun. It's getting tiring." She states she would probably go to a spa and get a massage. She states if she could go out to dinner, it would probably be [employer] or a [employer]. She states she does not have an estimated d/c date due to uncertainty in obtaining SSD. "I'm ready to get my own place and be with my kids. I need to be with my kids." She reports they came to visit her two weeks ago and that they are doing well. She expresses her feeling surrounding the uncertainty, not having a home, being separated from her children. She states, "I should have just stayed in [place]. I would have been able to keep my apartment. I was chasing the money. I did it for my kids, to get them out of the environment they were living in. I gave up the disability for them and lost everything. And then Covid happened and I lost my job." She reports she currently has a man in her life named Legrand Como, who she met online. She reports he came to visit her at the facility a couple weeks ago. She reports he is planning to come and visit again and they plan to go on a picnic. She is receptive to feedback from cln.   Suicidal/Homicidal: Nowithout intent/plan  Therapist Response: Cln assessed for current stressors, symptoms, and safety since last session. Cln utilized active listening and validation to assist with processing. Cln challenged pt's thoughts surrounding blaming herself and provided support. Cln confirmed next scheduled f/u appt and  Plan: Return again in 4 weeks.  Diagnosis: Major depressive disorder, recurrent episode, moderate ([place])  Collaboration of Care: Other none required for this visit  Patient/Guardian was advised Release of Information must be obtained prior to any record release in order to collaborate their care with an outside provider. Patient/Guardian was advised if they have not already done so to contact the registration department to sign all  necessary forms in order for [place] to release information regarding their care.   Consent: Patient/Guardian gives verbal consent for treatment and assignment of benefits for services provided during this visit. Patient/Guardian expressed understanding and agreed to proceed.   Heather [name], LCSWA 11/21/2021

## 2021-12-11 ENCOUNTER — Telehealth (HOSPITAL_COMMUNITY): Payer: Medicaid Other | Admitting: Psychiatry

## 2021-12-17 ENCOUNTER — Encounter (HOSPITAL_COMMUNITY): Payer: Self-pay

## 2021-12-17 ENCOUNTER — Ambulatory Visit (INDEPENDENT_AMBULATORY_CARE_PROVIDER_SITE_OTHER): Payer: Medicaid Other | Admitting: Licensed Clinical Social Worker

## 2021-12-17 DIAGNOSIS — F331 Major depressive disorder, recurrent, moderate: Secondary | ICD-10-CM

## 2021-12-17 NOTE — Progress Notes (Signed)
Virtual Visit via Video Note  I connected with Ladaja Yusupov Baumgart on 12/17/21 at  3:00 PM EDT by a video enabled telemedicine application and verified that I am speaking with the correct person using two identifiers.  Location: Patient: nursing facility Provider: clinical office   I discussed the limitations of evaluation and management by telemedicine and the availability of in person appointments. The patient expressed understanding and agreed to proceed.  I discussed the assessment and treatment plan with the patient. The patient was provided an opportunity to ask questions and all were answered. The patient agreed with the plan and demonstrated an understanding of the instructions.   The patient was advised to call back or seek an in-person evaluation if the symptoms worsen or if the condition fails to improve as anticipated.  I provided 45 minutes of non-face-to-face time during this encounter.   Heron Nay, LCSWA    THERAPIST PROGRESS NOTE  Session Time: 45 minutes  Participation Level: Active  Behavioral Response: CasualAlertEuthymic  Type of Therapy: Individual Therapy  Treatment Goals addressed: coping  ProgressTowards Goals: Progressing  Interventions: Supportive  Summary: Payden Bonus Jobin is a 46 y.o. female who presents for f/u with this cln. She joins the session on time and maintains good eye contact throughout. She reports she had her procedure went well and they were able to get all of the stones and she only had to stay in the hospital one night. She states she is scheduled for a procedure to remove the mass from her other kidney on 10/5. She states her mood has been okay. She reports her children's guardian's husband was just diagnosed with stage 4 cancer and has tumors on his kidney and brain. "I'm more worried about him than myself. Both of my girls are attached to him." She states he is supposed to have surgery at the [facility] in [place]. When asked about  the man in her life, she reports "It turned out to just be a fling." She states she is not worried about it. She states she made two friends at the facility but they were discharged. She discusses how she feels about her pastor and how supportive he has been. She describes all the ways in which he has helped her and stuck by her. She also reports she still talks to her mother, who hardly says anything, and she sometimes visits her when her twins' guardian comes to see her. She reiterates how much she misses her daughters and how she wants to go back to living her life normally.   Suicidal/Homicidal: Nowithout intent/plan  Therapist Response: Cln assessed for current stressors, symptoms, and safety since last session. Cln utilized active listening and validation to assist with processing. Cln encouraged pt to tell her pastor how she feels if she feels comfortable and self-disclosed having made the first move and it working out. Cln wished pt well on her procedure and verbalized strengths. Cln pointed out that pt is expressing more hope than usual and cln expressed hope as well. Cln scheduled follow-up appointment and confirmed pt's availability and preferred method of service delivery (virtual).   Plan: Return again in 6 weeks (next available appt).  Diagnosis: Major depressive disorder, recurrent episode, moderate ([place])  Collaboration of Care: Other none required for this visit  Patient/Guardian was advised Release of Information must be obtained prior to any record release in order to collaborate their care with an outside provider. Patient/Guardian was advised if they have not already done so to  contact the registration department to sign all necessary forms in order for [place] to release information regarding their care.   Consent: Patient/Guardian gives verbal consent for treatment and assignment of benefits for services provided during this visit. Patient/Guardian expressed understanding and agreed  to proceed.   Heron Nay, LCSWA 12/17/2021

## 2022-01-28 ENCOUNTER — Encounter (HOSPITAL_COMMUNITY): Payer: Self-pay

## 2022-01-28 ENCOUNTER — Ambulatory Visit (INDEPENDENT_AMBULATORY_CARE_PROVIDER_SITE_OTHER): Payer: Medicaid Other | Admitting: Licensed Clinical Social Worker

## 2022-01-28 DIAGNOSIS — F331 Major depressive disorder, recurrent, moderate: Secondary | ICD-10-CM | POA: Diagnosis not present

## 2022-01-28 NOTE — Progress Notes (Signed)
Virtual Visit via Video Note  I connected with Heather [name] on 01/28/22 at  4:00 PM EDT by a video enabled telemedicine application and verified that I am speaking with the correct person using two identifiers.  Location: Patient: SNF where pt is staying Provider: clinical office   I discussed the limitations of evaluation and management by telemedicine and the availability of in person appointments. The patient expressed understanding and agreed to proceed.  I discussed the assessment and treatment plan with the patient. The patient was provided an opportunity to ask questions and all were answered. The patient agreed with the plan and demonstrated an understanding of the instructions.   The patient was advised to call back or seek an in-person evaluation if the symptoms worsen or if the condition fails to improve as anticipated.  I provided 45 minutes of non-face-to-face time during this encounter.   Heron Nay, LCSWA    THERAPIST PROGRESS NOTE  Session Time: 45 minutes  Participation Level: Active  Behavioral Response: CasualAlertDepressed  Type of Therapy: Individual Therapy  Treatment Goals addressed: coping  ProgressTowards Goals: Progressing  Interventions: Supportive  Summary: Heather [name] is a 46 y.o. female who presents for f/u with this cln. She connects to the session on time and maintains appropriate eye contact throughout. She appears to have gotten a haircut, which pt confirms and cln compliments. She reports she has been walking some with a walker and reports increased mobility. She states she is feeling depressed due to being in the skilled nursing facility (SNF) over the holidays. She states her pain has been controlled and is scheduled to see the nephrologist on 11/9 to discuss whether or not she will require a third surgery for her kidney. She reports her mood otherwise has been okay. She states her daughters' foster father had to have  surgery in [place], and so pt has not been able to see them since September. She states their birthday is coming up this Sunday. She expresses frustration with how she was treated at [facility] hospital last year, including how a knee fracture and herniated disc was missed. She expresses that she is anxious regarding her 11/9 appt, as she is unsure of whether or not the mass on her kidney is cancerous and what the tx plan is. She states she believes she would have had better treatment at [facility]. She also talks about her pastor wanting her to stay in [place] once she discharges from the SNF and she states "He's gonna have to get over himself." She is receptive to feedback from cln.  Suicidal/Homicidal: Nowithout intent/plan  Therapist Response: Cln assessed for current stressors, symptoms, and safety since last session. Cln utilized active listening and validation to assist with processing. Cln validated pt's frustration regarding her hospital treatment as well as her frustration with the lack of answers she has been given regarding whether or not she has cancer. Cln stated that pt is coping very well emotionally considering the significant stressors she has endured over the the past year. Cln alluded to previous session in which pt discussed her pastor possibly having romantic feelings for her and cln expressed hope that pt and her pastor would fall in love, to which pt said "Well one of [place] needs to say something and it's not going to be me. Maybe he doesn't want to while I'm in a nursing home." Cln encouraged pt to continue to process her feelings and informed her cln will be thinking of her on the  day she has her appt. Cln scheduled follow-up appointment and confirmed pt's availability and preferred method of service delivery (virtual).   Plan: Return again in 3 weeks.  Diagnosis: Major depressive disorder, recurrent episode, moderate ([place])  Collaboration of Care: Other none required for this  visit.  Patient/Guardian was advised Release of Information must be obtained prior to any record release in order to collaborate their care with an outside provider. Patient/Guardian was advised if they have not already done so to contact the registration department to sign all necessary forms in order for [place] to release information regarding their care.   Consent: Patient/Guardian gives verbal consent for treatment and assignment of benefits for services provided during this visit. Patient/Guardian expressed understanding and agreed to proceed.   Heron Nay, LCSWA 01/28/2022

## 2022-01-29 ENCOUNTER — Encounter (HOSPITAL_COMMUNITY): Payer: Self-pay

## 2022-01-29 NOTE — Plan of Care (Signed)
  Problem: Depression CCP Problem  1 Learn and Apply Coping Skills to Decrease Depression Symptoms   Goal: LTG: Ticia WILL SCORE LESS THAN 10 ON THE PATIENT HEALTH QUESTIONNAIRE (PHQ-9) Outcome: Progressing Goal: STG: Reet WILL PARTICIPATE IN AT LEAST 80% OF SCHEDULED INDIVIDUAL PSYCHOTHERAPY SESSIONS Outcome: Progressing

## 2022-02-19 ENCOUNTER — Ambulatory Visit (INDEPENDENT_AMBULATORY_CARE_PROVIDER_SITE_OTHER): Payer: Medicaid Other | Admitting: Licensed Clinical Social Worker

## 2022-02-19 DIAGNOSIS — F331 Major depressive disorder, recurrent, moderate: Secondary | ICD-10-CM | POA: Diagnosis not present

## 2022-02-19 DIAGNOSIS — F0631 Mood disorder due to known physiological condition with depressive features: Secondary | ICD-10-CM

## 2022-02-19 NOTE — Psych (Signed)
Virtual Visit via Video Note  I connected with Heather [name] on 02/19/22 at  4:30 PM EST by a video enabled telemedicine application and verified that I am speaking with the correct person using two identifiers.  Location: Patient: pt's nursing facility Provider: clinical home office   I discussed the limitations of evaluation and management by telemedicine and the availability of in person appointments. The patient expressed understanding and agreed to proceed.   I discussed the assessment and treatment plan with the patient. The patient was provided an opportunity to ask questions and all were answered. The patient agreed with the plan and demonstrated an understanding of the instructions.   The patient was advised to call back or seek an in-person evaluation if the symptoms worsen or if the condition fails to improve as anticipated.  I provided 35 minutes of non-face-to-face time during this encounter.   Heather [name], LCSWA   [facility] THERAPIST PROGRESS NOTE  Heather [name] [id number]  Session Time: 35 minutes  Participation Level: Active  Behavioral Response: CasualAlertDepressed  Type of Therapy: Individual Therapy  Treatment Goals addressed: Coping  Progress Towards Goals: Progressing  Interventions: Supportive  Summary: Heather [name] is a 46 y.o. female who presents for f/u with this cln. She arrives on time and maintains appropriate eye contact throughout the session. She reports she just finished visiting with her children and that the visit went well. She shows cln pictures, one of her daughters walking and a school picture of one daughter. She reports it was confirmed that she has renal carcinoma and needs surgery for a radical nephrectomy, which involves removal of her kidney and the surrounding structures. She states they will attempt it laparoscopically but it may have to be converted to an open surgery. She also states the MD told her she will  not have to go on dialysis right away. She states she will be glad when it's over and she feels nervous, as the MD informed her she is high risk for serious complications. She states she has been doing OT and recently started PT, which is going well. She reports her mobility has improved somewhat and that she was able to walk to the gym and back. She reports increased pain due to spasms. She shares that she has applied for housing in numerous cities and is on multiple waiting lists, and so far she has heard back from [place], [place]. When cln asks where pt's facility is located, pt asks "Why? Are you gonna come see me?" Pt expresses excitement at the prospect of cln visiting her and states she would like someone to be with her on the day of her surgery. She is agreeable to cln visiting the day before or the day of surgery, depending on the day and time it is scheduled.  Suicidal/Homicidal: Nowithout intent/plan  Therapist Response: Cln assessed for current stressors, symptoms, and safety since last session. Cln utilized active listening and validation to assist with processing. Cln expressed sympathy for pt's diagnosis and asked how pt can best be supported. Cln discussed the possibility of cln visiting pt and agreed to visit pt either the day before or the day of her surgery to offer additional support due to the severity of pt's diagnosis. Cln scheduled follow-up appointment and confirmed pt's availability and preferred method of service delivery (virtual).  Plan: Return again in 2 weeks  Collaboration of Care: Other none required for this visit  Patient/Guardian was advised Release of Information must  be obtained prior to any record release in order to collaborate their care with an outside provider. Patient/Guardian was advised if they have not already done so to contact the registration department to sign all necessary forms in order for [place] to release information regarding their care.   Consent:  Patient/Guardian gives verbal consent for treatment and assignment of benefits for services provided during this visit. Patient/Guardian expressed understanding and agreed to proceed.   Diagnosis: Depression due to physical illness [F06.31]    1. Depression due to physical illness       Heather [name], Heather [name] 02/19/2022

## 2022-03-08 ENCOUNTER — Ambulatory Visit (INDEPENDENT_AMBULATORY_CARE_PROVIDER_SITE_OTHER): Payer: Medicaid Other | Admitting: Licensed Clinical Social Worker

## 2022-03-08 DIAGNOSIS — F0631 Mood disorder due to known physiological condition with depressive features: Secondary | ICD-10-CM

## 2022-03-08 DIAGNOSIS — F331 Major depressive disorder, recurrent, moderate: Secondary | ICD-10-CM
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
## 2022-03-25 ENCOUNTER — Ambulatory Visit (HOSPITAL_COMMUNITY): Payer: Medicaid Other | Admitting: Licensed Clinical Social Worker

## 2022-04-22 ENCOUNTER — Encounter (HOSPITAL_COMMUNITY): Payer: Self-pay

## 2022-04-22 ENCOUNTER — Ambulatory Visit (INDEPENDENT_AMBULATORY_CARE_PROVIDER_SITE_OTHER): Payer: Medicaid Other | Admitting: Licensed Clinical Social Worker

## 2022-04-22 DIAGNOSIS — F331 Major depressive disorder, recurrent, moderate: Secondary | ICD-10-CM

## 2022-04-22 DIAGNOSIS — F418 Other specified anxiety disorders: Secondary | ICD-10-CM

## 2022-04-22 DIAGNOSIS — F0631 Mood disorder due to known physiological condition with depressive features: Secondary | ICD-10-CM

## 2022-04-22 NOTE — Progress Notes (Signed)
Virtual Visit via Video Note  I connected with Stepheny Canal Clemons on 04/22/22 at  3:00 PM EST by a video enabled telemedicine application and verified that I am speaking with the correct person using two identifiers.  Location: Patient: pt's SNF Provider: clinical office   I discussed the limitations of evaluation and management by telemedicine and the availability of in person appointments. The patient expressed understanding and agreed to proceed.  I discussed the assessment and treatment plan with the patient. The patient was provided an opportunity to ask questions and all were answered. The patient agreed with the plan and demonstrated an understanding of the instructions.   The patient was advised to call back or seek an in-person evaluation if the symptoms worsen or if the condition fails to improve as anticipated.  I provided 40 minutes of non-face-to-face time during this encounter.   Heron Nay, LCSWA    THERAPIST PROGRESS NOTE  Session Time: 40 minutes  Participation Level: Active  Behavioral Response: DisheveledDrowsyAnxious and Depressed  Type of Therapy: Individual Therapy  Treatment Goals addressed: update tx plan  ProgressTowards Goals: Revised and updated  Interventions: Supportive  Summary: Tiara Maultsby Maul is a 47 y.o. female who presents for f/u with this cln. She arrives on time and maintains appropriate eye contact throughout the session. She reports she is doing "pretty good." She states she is nervous about the upcoming surgery. She denies depression symptoms. She states she has been sleeping more, napping during the day. She denies med changes to explain this. She states she got to see her daughters two days before Xmas and they decorated cookies. When asked about updated tx goals, pt states "I don't know how to word it. I guess just trying to take one day at a time." Pt provided verbal consent for cln to sign proposed tx plan on her behalf  electronically. She expresses increased anxiety regarding her upcoming surgery. She states her mother says pt worries too much. Pt states her biggest fear is that her heart will stop and she will end up in ICU and on prolonged life support. She states she has an advanced directive and has prepared to the best of her ability. She discusses her feelings related to her daughters being placed in foster care, stating that she did not neglect her children. She states she does recognize that she was unable to take care of them due to her physical disability and limited support.   Suicidal/Homicidal: Nowithout intent/plan  Therapist Response: Cln assessed for current stressors, symptoms, and safety since last session. Cln informed pt of the need to update tx plan and administered PHQ and GAD-7 to assess current severity of anxiety and depression levels. Cln developed tx plan and signed electronically on pt's behalf with her verbal consent. Cln utilized active listening and validation to assist with processing. Cln inquired about end-of-life planning in preparation for surgery considering the stated likelihood of complications. Cln suggested pt write letters to her daughters for them to read when they are older. Cln attempted to validate pt's fears and asked how her spiritual beliefs play a role in her coping. Cln reminded pt that she could not take care of her children due to severe health conditions and physical disability and not because she is a bad mother. Cln allowed space for pt to share and process feelings. Cln scheduled follow-up appointment and confirmed pt's availability and preferred method of service delivery (virtual).  Plan: Return again in 3 weeks.  Diagnosis: Depression  due to physical illness  Other specified anxiety disorders  Collaboration of Care: Other none required for this visit  Patient/Guardian was advised Release of Information must be obtained prior to any record release in order to  collaborate their care with an outside provider. Patient/Guardian was advised if they have not already done so to contact the registration department to sign all necessary forms in order for [place] to release information regarding their care.   Consent: Patient/Guardian gives verbal consent for treatment and assignment of benefits for services provided during this visit. Patient/Guardian expressed understanding and agreed to proceed.   Heron Nay, LCSWA 04/22/2022

## 2022-05-13 ENCOUNTER — Encounter (HOSPITAL_COMMUNITY): Payer: Self-pay

## 2022-05-13 ENCOUNTER — Ambulatory Visit (HOSPITAL_COMMUNITY): Payer: Medicaid Other | Admitting: Licensed Clinical Social Worker

## 2022-05-13 NOTE — Progress Notes (Addendum)
Cln signed on at 8:10 am, sent text link for video session per schedule, and remained online for session until 8:20 am. Pt failed to sign on for session.

## 2022-05-20 ENCOUNTER — Encounter (HOSPITAL_COMMUNITY): Payer: Self-pay

## 2022-05-20 ENCOUNTER — Telehealth (HOSPITAL_COMMUNITY): Payer: Self-pay | Admitting: Licensed Clinical Social Worker

## 2022-05-20 ENCOUNTER — Ambulatory Visit (HOSPITAL_COMMUNITY): Payer: Medicaid Other | Admitting: Licensed Clinical Social Worker

## 2022-05-20 DIAGNOSIS — F0631 Mood disorder due to known physiological condition with depressive features: Secondary | ICD-10-CM

## 2022-05-20 NOTE — Progress Notes (Addendum)
Cln signed on at 3:00 pm, sent text link for video session per schedule, and remained online for session until 3:15 pm. Pt failed to sign on for session.

## 2022-05-20 NOTE — Telephone Encounter (Signed)
See call intake 

## 2022-05-27 ENCOUNTER — Telehealth (HOSPITAL_COMMUNITY): Payer: Self-pay | Admitting: Licensed Clinical Social Worker

## 2022-05-27 NOTE — Telephone Encounter (Signed)
Cln called to check on pt due to pt being scheduled for major surgery last week and pt missing last two appointments. Pt states her surgery went well and she just got back to the facility where she resides. She apologizes for missing the last two appointments and states she overslept for one and the other was during an activity period. She states she would like to reschedule and confirms availability for 2/26 at 10 am virtual.

## 2022-06-03 ENCOUNTER — Ambulatory Visit (HOSPITAL_COMMUNITY): Payer: Medicaid Other | Admitting: Licensed Clinical Social Worker

## 2022-06-03 ENCOUNTER — Encounter (HOSPITAL_COMMUNITY): Payer: Self-pay

## 2022-06-03 NOTE — Progress Notes (Addendum)
Cln signed on at 10:00 am, sent text link for video session per schedule, and remained online for session until 10:10 am. Pt failed to sign on for session.

## 2022-10-30 ENCOUNTER — Encounter (HOSPITAL_COMMUNITY): Payer: Self-pay

## 2022-10-30 ENCOUNTER — Other Ambulatory Visit: Payer: Self-pay

## 2022-10-30 ENCOUNTER — Inpatient Hospital Stay (HOSPITAL_COMMUNITY)
Admission: EM | Admit: 2022-10-30 | Discharge: 2022-11-12 | DRG: 690 | Disposition: A | Discharge status: Skilled Nursing Facility | Payer: Medicaid Other | Attending: Family Medicine | Admitting: Family Medicine

## 2022-10-30 DIAGNOSIS — Z6841 Body Mass Index (BMI) 40.0 and over, adult: Secondary | ICD-10-CM

## 2022-10-30 DIAGNOSIS — Z9049 Acquired absence of other specified parts of digestive tract: Secondary | ICD-10-CM

## 2022-10-30 DIAGNOSIS — E119 Type 2 diabetes mellitus without complications: Secondary | ICD-10-CM

## 2022-10-30 DIAGNOSIS — L304 Erythema intertrigo: Secondary | ICD-10-CM | POA: Insufficient documentation

## 2022-10-30 DIAGNOSIS — G8929 Other chronic pain: Secondary | ICD-10-CM | POA: Diagnosis present

## 2022-10-30 DIAGNOSIS — Z87442 Personal history of urinary calculi: Secondary | ICD-10-CM

## 2022-10-30 DIAGNOSIS — N183 Chronic kidney disease, stage 3 unspecified: Secondary | ICD-10-CM | POA: Diagnosis present

## 2022-10-30 DIAGNOSIS — Z905 Acquired absence of kidney: Secondary | ICD-10-CM

## 2022-10-30 DIAGNOSIS — B9620 Unspecified Escherichia coli [E. coli] as the cause of diseases classified elsewhere: Secondary | ICD-10-CM | POA: Diagnosis present

## 2022-10-30 DIAGNOSIS — R32 Unspecified urinary incontinence: Secondary | ICD-10-CM | POA: Diagnosis present

## 2022-10-30 DIAGNOSIS — I10 Essential (primary) hypertension: Secondary | ICD-10-CM | POA: Diagnosis present

## 2022-10-30 DIAGNOSIS — E1122 Type 2 diabetes mellitus with diabetic chronic kidney disease: Secondary | ICD-10-CM | POA: Diagnosis present

## 2022-10-30 DIAGNOSIS — E7800 Pure hypercholesterolemia, unspecified: Secondary | ICD-10-CM | POA: Diagnosis present

## 2022-10-30 DIAGNOSIS — Z7951 Long term (current) use of inhaled steroids: Secondary | ICD-10-CM

## 2022-10-30 DIAGNOSIS — Z833 Family history of diabetes mellitus: Secondary | ICD-10-CM

## 2022-10-30 DIAGNOSIS — Z830 Family history of human immunodeficiency virus [HIV] disease: Secondary | ICD-10-CM

## 2022-10-30 DIAGNOSIS — N1832 Chronic kidney disease, stage 3b: Secondary | ICD-10-CM | POA: Diagnosis present

## 2022-10-30 DIAGNOSIS — Z87440 Personal history of urinary (tract) infections: Secondary | ICD-10-CM

## 2022-10-30 DIAGNOSIS — I129 Hypertensive chronic kidney disease with stage 1 through stage 4 chronic kidney disease, or unspecified chronic kidney disease: Secondary | ICD-10-CM | POA: Diagnosis present

## 2022-10-30 DIAGNOSIS — Z888 Allergy status to other drugs, medicaments and biological substances status: Secondary | ICD-10-CM

## 2022-10-30 DIAGNOSIS — Z8619 Personal history of other infectious and parasitic diseases: Secondary | ICD-10-CM

## 2022-10-30 DIAGNOSIS — X58XXXA Exposure to other specified factors, initial encounter: Secondary | ICD-10-CM | POA: Diagnosis present

## 2022-10-30 DIAGNOSIS — D631 Anemia in chronic kidney disease: Secondary | ICD-10-CM | POA: Diagnosis present

## 2022-10-30 DIAGNOSIS — Z8249 Family history of ischemic heart disease and other diseases of the circulatory system: Secondary | ICD-10-CM

## 2022-10-30 DIAGNOSIS — D638 Anemia in other chronic diseases classified elsewhere: Secondary | ICD-10-CM | POA: Diagnosis present

## 2022-10-30 DIAGNOSIS — Z880 Allergy status to penicillin: Secondary | ICD-10-CM

## 2022-10-30 DIAGNOSIS — Z87891 Personal history of nicotine dependence: Secondary | ICD-10-CM

## 2022-10-30 DIAGNOSIS — G4733 Obstructive sleep apnea (adult) (pediatric): Secondary | ICD-10-CM | POA: Diagnosis present

## 2022-10-30 DIAGNOSIS — F4310 Post-traumatic stress disorder, unspecified: Secondary | ICD-10-CM | POA: Diagnosis present

## 2022-10-30 DIAGNOSIS — Z825 Family history of asthma and other chronic lower respiratory diseases: Secondary | ICD-10-CM

## 2022-10-30 DIAGNOSIS — Z5901 Sheltered homelessness: Secondary | ICD-10-CM

## 2022-10-30 DIAGNOSIS — F250 Schizoaffective disorder, bipolar type: Secondary | ICD-10-CM | POA: Diagnosis present

## 2022-10-30 DIAGNOSIS — Z91030 Bee allergy status: Secondary | ICD-10-CM

## 2022-10-30 DIAGNOSIS — Z79899 Other long term (current) drug therapy: Secondary | ICD-10-CM

## 2022-10-30 DIAGNOSIS — G2581 Restless legs syndrome: Secondary | ICD-10-CM | POA: Diagnosis present

## 2022-10-30 DIAGNOSIS — J449 Chronic obstructive pulmonary disease, unspecified: Secondary | ICD-10-CM | POA: Diagnosis present

## 2022-10-30 DIAGNOSIS — Z818 Family history of other mental and behavioral disorders: Secondary | ICD-10-CM

## 2022-10-30 DIAGNOSIS — Z83719 Family history of colon polyps, unspecified: Secondary | ICD-10-CM

## 2022-10-30 DIAGNOSIS — N12 Tubulo-interstitial nephritis, not specified as acute or chronic: Principal | ICD-10-CM | POA: Diagnosis present

## 2022-10-30 DIAGNOSIS — N10 Acute tubulo-interstitial nephritis: Principal | ICD-10-CM | POA: Diagnosis present

## 2022-10-30 DIAGNOSIS — Z91040 Latex allergy status: Secondary | ICD-10-CM

## 2022-10-30 DIAGNOSIS — S8001XA Contusion of right knee, initial encounter: Secondary | ICD-10-CM | POA: Diagnosis present

## 2022-10-30 DIAGNOSIS — Z881 Allergy status to other antibiotic agents status: Secondary | ICD-10-CM

## 2022-10-30 NOTE — ED Triage Notes (Signed)
EMS report patient came from extended stay.  Hx of kidney blockage. Patient was not abel to get up from chair.  Temp 98.6 BP 122/78 Pulse 99 Resp 18  O2 98

## 2022-10-31 ENCOUNTER — Inpatient Hospital Stay (HOSPITAL_COMMUNITY): Payer: Medicaid Other

## 2022-10-31 DIAGNOSIS — Z905 Acquired absence of kidney: Secondary | ICD-10-CM | POA: Diagnosis not present

## 2022-10-31 DIAGNOSIS — Z8619 Personal history of other infectious and parasitic diseases: Secondary | ICD-10-CM | POA: Diagnosis not present

## 2022-10-31 DIAGNOSIS — B9620 Unspecified Escherichia coli [E. coli] as the cause of diseases classified elsewhere: Secondary | ICD-10-CM | POA: Diagnosis present

## 2022-10-31 DIAGNOSIS — E1122 Type 2 diabetes mellitus with diabetic chronic kidney disease: Secondary | ICD-10-CM | POA: Diagnosis present

## 2022-10-31 DIAGNOSIS — N12 Tubulo-interstitial nephritis, not specified as acute or chronic: Secondary | ICD-10-CM | POA: Diagnosis not present

## 2022-10-31 DIAGNOSIS — G4733 Obstructive sleep apnea (adult) (pediatric): Secondary | ICD-10-CM | POA: Diagnosis present

## 2022-10-31 DIAGNOSIS — Z91040 Latex allergy status: Secondary | ICD-10-CM | POA: Diagnosis not present

## 2022-10-31 DIAGNOSIS — E7800 Pure hypercholesterolemia, unspecified: Secondary | ICD-10-CM | POA: Diagnosis present

## 2022-10-31 DIAGNOSIS — N10 Acute tubulo-interstitial nephritis: Secondary | ICD-10-CM | POA: Diagnosis present

## 2022-10-31 DIAGNOSIS — Z888 Allergy status to other drugs, medicaments and biological substances status: Secondary | ICD-10-CM | POA: Diagnosis not present

## 2022-10-31 DIAGNOSIS — G2581 Restless legs syndrome: Secondary | ICD-10-CM | POA: Diagnosis present

## 2022-10-31 DIAGNOSIS — F250 Schizoaffective disorder, bipolar type: Secondary | ICD-10-CM | POA: Diagnosis present

## 2022-10-31 DIAGNOSIS — Z5901 Sheltered homelessness: Secondary | ICD-10-CM | POA: Diagnosis not present

## 2022-10-31 DIAGNOSIS — Z87891 Personal history of nicotine dependence: Secondary | ICD-10-CM | POA: Diagnosis not present

## 2022-10-31 DIAGNOSIS — Z9049 Acquired absence of other specified parts of digestive tract: Secondary | ICD-10-CM | POA: Diagnosis not present

## 2022-10-31 DIAGNOSIS — Z881 Allergy status to other antibiotic agents status: Secondary | ICD-10-CM | POA: Diagnosis not present

## 2022-10-31 DIAGNOSIS — I129 Hypertensive chronic kidney disease with stage 1 through stage 4 chronic kidney disease, or unspecified chronic kidney disease: Secondary | ICD-10-CM | POA: Diagnosis present

## 2022-10-31 DIAGNOSIS — Z6841 Body Mass Index (BMI) 40.0 and over, adult: Secondary | ICD-10-CM | POA: Diagnosis not present

## 2022-10-31 DIAGNOSIS — L304 Erythema intertrigo: Secondary | ICD-10-CM | POA: Insufficient documentation

## 2022-10-31 DIAGNOSIS — Z8249 Family history of ischemic heart disease and other diseases of the circulatory system: Secondary | ICD-10-CM | POA: Diagnosis not present

## 2022-10-31 DIAGNOSIS — Z91030 Bee allergy status: Secondary | ICD-10-CM | POA: Diagnosis not present

## 2022-10-31 DIAGNOSIS — Z880 Allergy status to penicillin: Secondary | ICD-10-CM | POA: Diagnosis not present

## 2022-10-31 DIAGNOSIS — D631 Anemia in chronic kidney disease: Secondary | ICD-10-CM | POA: Diagnosis present

## 2022-10-31 DIAGNOSIS — X58XXXA Exposure to other specified factors, initial encounter: Secondary | ICD-10-CM | POA: Diagnosis present

## 2022-10-31 DIAGNOSIS — N1832 Chronic kidney disease, stage 3b: Secondary | ICD-10-CM | POA: Diagnosis present

## 2022-10-31 DIAGNOSIS — J449 Chronic obstructive pulmonary disease, unspecified: Secondary | ICD-10-CM | POA: Diagnosis present

## 2022-10-31 LAB — COMPREHENSIVE METABOLIC PANEL
ALT: 15 U/L (ref 0–44)
AST: 15 U/L (ref 15–41)
Albumin: 3.1 g/dL — ABNORMAL LOW (ref 3.5–5.0)
Alkaline Phosphatase: 73 U/L (ref 38–126)
Anion gap: 10 (ref 5–15)
BUN: 16 mg/dL (ref 6–20)
CO2: 24 mmol/L (ref 22–32)
Calcium: 8.8 mg/dL — ABNORMAL LOW (ref 8.9–10.3)
Chloride: 103 mmol/L (ref 98–111)
Creatinine, Ser: 1.55 mg/dL — ABNORMAL HIGH (ref 0.44–1.00)
GFR, Estimated: 42 mL/min — ABNORMAL LOW (ref >60)
Glucose, Bld: 95 mg/dL (ref 70–99)
Potassium: 4.1 mmol/L (ref 3.5–5.1)
Sodium: 137 mmol/L (ref 135–145)
Total Bilirubin: 0.6 mg/dL (ref 0.3–1.2)
Total Protein: 7.8 g/dL (ref 6.5–8.1)

## 2022-10-31 LAB — BLOOD GAS, VENOUS
Acid-Base Excess: 0.8 mmol/L (ref 0.0–2.0)
Bicarbonate: 26.6 mmol/L (ref 20.0–28.0)
O2 Saturation: 90.7 %
Patient temperature: 37
pCO2, Ven: 46 mmHg (ref 44–60)
pH, Ven: 7.37 (ref 7.25–7.43)
pO2, Ven: 56 mmHg — ABNORMAL HIGH (ref 32–45)

## 2022-10-31 LAB — URINALYSIS, ROUTINE W REFLEX MICROSCOPIC
Bilirubin Urine: NEGATIVE
Glucose, UA: NEGATIVE mg/dL
Ketones, ur: NEGATIVE mg/dL
Nitrite: POSITIVE — AB
Protein, ur: 30 mg/dL — AB
Specific Gravity, Urine: 1.014 (ref 1.005–1.030)
WBC, UA: >50 WBC/hpf (ref 0–5)
pH: 5 (ref 5.0–8.0)

## 2022-10-31 LAB — I-STAT CHEM 8, ED
BUN: 18 mg/dL (ref 6–20)
Calcium, Ion: 1.19 mmol/L (ref 1.15–1.40)
Chloride: 105 mmol/L (ref 98–111)
Creatinine, Ser: 1.7 mg/dL — ABNORMAL HIGH (ref 0.44–1.00)
Glucose, Bld: 95 mg/dL (ref 70–99)
HCT: 31 % — ABNORMAL LOW (ref 36.0–46.0)
Hemoglobin: 10.5 g/dL — ABNORMAL LOW (ref 12.0–15.0)
Potassium: 4.7 mmol/L (ref 3.5–5.1)
Sodium: 140 mmol/L (ref 135–145)
TCO2: 28 mmol/L (ref 22–32)

## 2022-10-31 LAB — CBC WITH DIFFERENTIAL/PLATELET
Abs Immature Granulocytes: 0.29 10*3/uL — ABNORMAL HIGH (ref 0.00–0.07)
Basophils Absolute: 0.1 10*3/uL (ref 0.0–0.1)
Basophils Relative: 0 %
Eosinophils Absolute: 0.6 10*3/uL — ABNORMAL HIGH (ref 0.0–0.5)
Eosinophils Relative: 3 %
HCT: 31.8 % — ABNORMAL LOW (ref 36.0–46.0)
Hemoglobin: 8.8 g/dL — ABNORMAL LOW (ref 12.0–15.0)
Immature Granulocytes: 2 %
Lymphocytes Relative: 12 %
Lymphs Abs: 2.3 10*3/uL (ref 0.7–4.0)
MCH: 23.1 pg — ABNORMAL LOW (ref 26.0–34.0)
MCHC: 27.7 g/dL — ABNORMAL LOW (ref 30.0–36.0)
MCV: 83.5 fL (ref 80.0–100.0)
Monocytes Absolute: 0.8 10*3/uL (ref 0.1–1.0)
Monocytes Relative: 4 %
Neutro Abs: 15.9 10*3/uL — ABNORMAL HIGH (ref 1.7–7.7)
Neutrophils Relative %: 79 %
Platelets: 393 10*3/uL (ref 150–400)
RBC: 3.81 MIL/uL — ABNORMAL LOW (ref 3.87–5.11)
RDW: 19.5 % — ABNORMAL HIGH (ref 11.5–15.5)
WBC: 19.9 10*3/uL — ABNORMAL HIGH (ref 4.0–10.5)
nRBC: 0 % (ref 0.0–0.2)

## 2022-10-31 LAB — ETHANOL: Alcohol, Ethyl (B): <10 mg/dL (ref <10)

## 2022-10-31 LAB — TROPONIN I (HIGH SENSITIVITY)
Troponin I (High Sensitivity): 7 ng/L (ref <18)
Troponin I (High Sensitivity): 7 ng/L (ref <18)

## 2022-10-31 LAB — HCG, SERUM, QUALITATIVE: Preg, Serum: NEGATIVE

## 2022-10-31 LAB — CBG MONITORING, ED: Glucose-Capillary: 82 mg/dL (ref 70–99)

## 2022-10-31 MED ORDER — LIDOCAINE 5 % EX PTCH
3.0000 | MEDICATED_PATCH | CUTANEOUS | Status: DC
  Administered 2022-10-31 – 2022-11-12 (×5): 3 via TRANSDERMAL
  Filled 2022-10-31 (×13): qty 3

## 2022-10-31 MED ORDER — FUROSEMIDE 40 MG PO TABS
40.0000 mg | ORAL_TABLET | Freq: Every day | ORAL | Status: DC
  Administered 2022-10-31 – 2022-11-12 (×13): 40 mg via ORAL
  Filled 2022-10-31 (×13): qty 1

## 2022-10-31 MED ORDER — DICLOFENAC SODIUM 1 % EX GEL: 4.0000 g | Freq: Three times a day (TID) | CUTANEOUS | Status: DC | PRN

## 2022-10-31 MED ORDER — PROCHLORPERAZINE EDISYLATE 10 MG/2ML IJ SOLN: 10.0000 mg | INTRAMUSCULAR | Status: DC | PRN

## 2022-10-31 MED ORDER — CLONAZEPAM 0.125 MG PO TBDP
0.2500 mg | ORAL_TABLET | Freq: Two times a day (BID) | ORAL | Status: DC
  Administered 2022-10-31 – 2022-11-12 (×25): 0.25 mg via ORAL
  Filled 2022-10-31 (×26): qty 2

## 2022-10-31 MED ORDER — BENZONATATE 100 MG PO CAPS: 100.0000 mg | ORAL_CAPSULE | Freq: Three times a day (TID) | ORAL | Status: DC | PRN

## 2022-10-31 MED ORDER — MOMETASONE FURO-FORMOTEROL FUM 100-5 MCG/ACT IN AERO
2.0000 | INHALATION_SPRAY | Freq: Two times a day (BID) | RESPIRATORY_TRACT | Status: DC
  Administered 2022-10-31 – 2022-11-12 (×25): 2 via RESPIRATORY_TRACT
  Filled 2022-10-31: qty 8.8

## 2022-10-31 MED ORDER — FLUTICASONE PROPIONATE 50 MCG/ACT NA SUSP
2.0000 | Freq: Two times a day (BID) | NASAL | Status: DC | PRN
  Filled 2022-10-31: qty 16

## 2022-10-31 MED ORDER — ARIPIPRAZOLE 10 MG PO TABS
5.0000 mg | ORAL_TABLET | Freq: Every day | ORAL | Status: DC
  Administered 2022-10-31 – 2022-11-12 (×13): 5 mg via ORAL
  Filled 2022-10-31 (×13): qty 1

## 2022-10-31 MED ORDER — SODIUM CHLORIDE 0.9 % IV SOLN
1.0000 g | INTRAVENOUS | Status: AC
  Administered 2022-10-31: 1 g via INTRAVENOUS
  Filled 2022-10-31: qty 20

## 2022-10-31 MED ORDER — POLYETHYLENE GLYCOL 3350 17 G PO PACK: 17.0000 g | PACK | Freq: Every day | ORAL | Status: DC | PRN

## 2022-10-31 MED ORDER — SODIUM CHLORIDE 0.9 % IV SOLN: 1.0000 g | Freq: Once | INTRAVENOUS | Status: DC

## 2022-10-31 MED ORDER — ACETAMINOPHEN 500 MG PO TABS
1000.0000 mg | ORAL_TABLET | Freq: Four times a day (QID) | ORAL | Status: DC | PRN
  Administered 2022-11-01 – 2022-11-08 (×2): 1000 mg via ORAL
  Filled 2022-10-31 (×2): qty 2

## 2022-10-31 MED ORDER — ROPINIROLE HCL ER 4 MG PO TB24
4.0000 mg | ORAL_TABLET | Freq: Every day | ORAL | Status: DC
  Administered 2022-10-31 – 2022-11-11 (×12): 4 mg via ORAL
  Filled 2022-10-31 (×15): qty 1

## 2022-10-31 MED ORDER — DULOXETINE HCL 30 MG PO CPEP
90.0000 mg | ORAL_CAPSULE | Freq: Every day | ORAL | Status: DC
  Administered 2022-10-31 – 2022-11-12 (×13): 90 mg via ORAL
  Filled 2022-10-31 (×14): qty 3

## 2022-10-31 MED ORDER — SODIUM CHLORIDE 0.9 % IV SOLN
1.0000 g | Freq: Three times a day (TID) | INTRAVENOUS | Status: AC
  Administered 2022-10-31 – 2022-11-05 (×15): 1 g via INTRAVENOUS
  Filled 2022-10-31 (×16): qty 20

## 2022-10-31 MED ORDER — MONTELUKAST SODIUM 10 MG PO TABS
10.0000 mg | ORAL_TABLET | Freq: Every day | ORAL | Status: DC
  Administered 2022-10-31 – 2022-11-11 (×12): 10 mg via ORAL
  Filled 2022-10-31 (×13): qty 1

## 2022-10-31 MED ORDER — ENOXAPARIN SODIUM 60 MG/0.6ML IJ SOSY
60.0000 mg | PREFILLED_SYRINGE | Freq: Two times a day (BID) | INTRAMUSCULAR | Status: DC
  Administered 2022-10-31 – 2022-11-12 (×25): 60 mg via SUBCUTANEOUS
  Filled 2022-10-31 (×25): qty 0.6

## 2022-10-31 MED ORDER — PANTOPRAZOLE SODIUM 40 MG PO TBEC
40.0000 mg | DELAYED_RELEASE_TABLET | Freq: Every day | ORAL | Status: DC
  Administered 2022-10-31 – 2022-11-12 (×13): 40 mg via ORAL
  Filled 2022-10-31 (×13): qty 1

## 2022-10-31 MED ORDER — GABAPENTIN 100 MG PO CAPS
100.0000 mg | ORAL_CAPSULE | Freq: Four times a day (QID) | ORAL | Status: DC
  Administered 2022-10-31 – 2022-11-12 (×50): 100 mg via ORAL
  Filled 2022-10-31 (×51): qty 1

## 2022-10-31 MED ORDER — MELATONIN 5 MG PO TABS: 5.0000 mg | ORAL_TABLET | Freq: Every evening | ORAL | Status: DC | PRN

## 2022-10-31 MED ORDER — SODIUM CHLORIDE 0.9 % IV SOLN: INTRAVENOUS | Status: DC

## 2022-10-31 MED ORDER — KETOCONAZOLE 2 % EX CREA
TOPICAL_CREAM | Freq: Two times a day (BID) | CUTANEOUS | Status: DC
  Administered 2022-11-01 – 2022-11-11 (×5): 1 via TOPICAL
  Filled 2022-10-31 (×5): qty 15

## 2022-10-31 MED ORDER — GERHARDT'S BUTT CREAM
TOPICAL_CREAM | Freq: Two times a day (BID) | CUTANEOUS | Status: DC
  Administered 2022-11-01 – 2022-11-11 (×7): 1 via TOPICAL
  Filled 2022-10-31 (×2): qty 1

## 2022-10-31 MED ORDER — ACETAMINOPHEN 650 MG RE SUPP: 650.0000 mg | Freq: Four times a day (QID) | RECTAL | Status: DC | PRN

## 2022-10-31 MED ORDER — ALBUTEROL SULFATE (2.5 MG/3ML) 0.083% IN NEBU: 2.5000 mg | INHALATION_SOLUTION | RESPIRATORY_TRACT | Status: DC | PRN

## 2022-10-31 MED ORDER — MELATONIN 5 MG PO TABS
10.0000 mg | ORAL_TABLET | Freq: Every day | ORAL | Status: DC
  Administered 2022-10-31 – 2022-11-11 (×12): 10 mg via ORAL
  Filled 2022-10-31 (×13): qty 2

## 2022-10-31 NOTE — Progress Notes (Signed)
ANTICOAGULATION CONSULT NOTE - Initial Consult  Pharmacy Consult for Enoxaparin  Indication: VTE prophylaxis  Allergies  Allergen Reactions   Amoxicillin Anaphylaxis   Bee Venom Shortness Of Breath and Swelling   Ciprofloxacin Anaphylaxis   Keflex [Cephalexin] Anaphylaxis   Penicillin G Anaphylaxis   Penicillins Anaphylaxis    Tolerates ceftriaxone Has patient had a PCN reaction causing immediate rash, facial/tongue/throat swelling, SOB or lightheadedness with hypotension: No Has patient had a PCN reaction causing severe rash involving mucus membranes or skin necrosis: No Has patient had a PCN reaction that required hospitalization No Has patient had a PCN reaction occurring within the last 10 years: No If all of the above answers are "NO", then may proceed with Cephalosporin use.'  REACTION: Angioedema   Adhesive [Tape] Itching and Rash   Latex Itching and Rash   Vancomycin Other (See Comments)    Pt can tolerate Vancomycin but did cause Vancomycin Infusion Reaction.  Recommend to pre-medicate with Benadryl before doses administered.      Patient Measurements: Height: 5\' 3"  (160 cm) Weight: (!) 238.6 kg (526 lb 0.3 oz) IBW/kg (Calculated) : 52.4   Vital Signs: Temp: 99.2 F (37.3 C) (07/25 1027) Temp Source: Oral (07/25 1027) BP: 103/60 (07/25 1027) Pulse Rate: 103 (07/25 1027)  Labs: Recent Labs    10/31/22 0258 10/31/22 0309 10/31/22 0914  HGB 8.8* 10.5*  --   HCT 31.8* 31.0*  --   PLT 393  --   --   CREATININE 1.55* 1.70*  --   TROPONINIHS  --   --  7    Estimated Creatinine Clearance: 84.6 mL/min (A) (by C-G formula based on SCr of 1.7 mg/dL (H)).   Medical History: Past Medical History:  Diagnosis Date   Anxiety    Arthritis    Asthma    Back pain    Constipation    COPD (chronic obstructive pulmonary disease) (HCC)    Depression    Diabetes mellitus without complication (HCC)    patient denies but states she has hyperglycemia-diet controlled    Dysrhythmia    DR Eden Emms     Ectopic pregnancy 2013   Eosinophilic esophagitis    Diagnosed at [facility] 06/16/2013, untreated   GERD (gastroesophageal reflux disease)    HEARTBURN   TUMS   High cholesterol    IBS (irritable bowel syndrome)    Leukocytosis 07/28/2008   Qualifier: Diagnosis of  By: [name] MD, Cornelius     Morbid obesity [facility])    Neuromuscular disorder (HCC)    RESTLESS LEG    Obesity    Schizoaffective disorder, bipolar type (HCC)    Sleep apnea    CPAP- in process of restarting     Medications:  no anticoagulants PTA  Assessment: Heather [name] is a 47 y.o. female with medical history significant of super morbid obesity, CKD, (R) Renal mass s/p (R) nephrectomy in February 2024, COPD, RLS, OSA, hyperlipidemia, chronic pain, schizoaffective disorder, anxiety/depression/PTSD, and recurrent UTIs. She presented 10/30/2022 with chief complaint of generalized fatigue and difficulty ambulating. She endorsed burning and itching with urination, urinary frequency, dyspnea, and reproducible chest pain. She was admitted for pyelonephritis and recurrent UTI.   Pharmacy has been consulted for Lovenox dosing for VTE prophylaxis.  Goal of Therapy:  Anti-Xa level 0.2-0.4 units/ml 4hrs after LMWH dose given Monitor platelets by anticoagulation protocol: Yes   Today, 10/31/22  - Hgb = 8.8 >> 10.5 g/dL - Plt = 295 K/uL - Scr = 1.7 mg/dL  Plan:  -  Enoxaparin 60mg  sq every 12 hours (to equal 0.5mg /kg/day)  - CBC an CMP ordered tomorrow by MD - monitor CBC / Scr at minimum every 72 hours - monitor signs/symptoms of bleeding  Cayle Cordoba W Gera Inboden 10/31/2022,10:41 AM

## 2022-10-31 NOTE — ED Notes (Signed)
Iv ultrasound is required

## 2022-10-31 NOTE — H&P (Addendum)
History and Physical    Patient: Heather [name] Staat ZDG:[id number] DOB: [birth year] DOA: 10/30/2022 DOS: the patient was seen and examined on 10/31/2022 PCP: Renaye Rakers, MD  Patient coming from: Home (Currently lives in [place], reports she left nursing facility last month because she felt like she was in jail)  Chief Complaint:  Chief Complaint  Patient presents with   Weakness   HPI: Heather [name] is a 47 y.o. female with medical history significant of super morbid obesity, CKD, (R) Renal mass s/p (R) nephrectomy in February 2024, COPD, RLS, OSA, hyperlipidemia, chronic pain, schizoaffective disorder, anxiety/depression/PTSD, and recurrent UTIs. She presents to [facility] ED with 4 day history of generalized fatigue and difficulty ambulating. She endorses burning and itching with urination, urinary frequency, dyspnea, and reproducible chest pain. She endorses (L) flank pain. Denies nausea, vomiting, abdominal pain, or CVA tenderness.   ED Course: Labs remarkable for leukocytosis of 19.9, hemoglobin 8.8 with a recheck of 10.5, creatinine 1.70, VBG with pH 3.37 pCO2 46 pO2 56 bicarb 26.6 and O2 saturation of 90.7. Creatinine 1.7. Urinalysis nitrate and leukocyte positive with many bacteria seen. No imaging obtained in the ED.  Review of Systems: As mentioned in the history of present illness. All other systems reviewed and are negative.  Past Medical History:  Diagnosis Date   Anxiety    Arthritis    Asthma    Back pain    Constipation    COPD (chronic obstructive pulmonary disease) (HCC)    Depression    Diabetes mellitus without complication (HCC)    patient denies but states she has hyperglycemia-diet controlled   Dysrhythmia    DR [facility]     Ectopic pregnancy 2013   Eosinophilic esophagitis    Diagnosed at [facility]/16/2013, untreated   GERD (gastroesophageal reflux disease)    HEARTBURN   TUMS   High cholesterol    IBS (irritable bowel syndrome)    Leukocytosis  07/28/2008   Qualifier: Diagnosis of  By: [name] MD, Cornelius     Morbid obesity [facility])    Neuromuscular disorder (HCC)    RESTLESS LEG    Obesity    Schizoaffective disorder, bipolar type (HCC)    Sleep apnea    CPAP- in process of restarting    Past Surgical History:  Procedure Laterality Date   BIOPSY  08/13/2018   Procedure: BIOPSY;  Surgeon: Benancio Deeds, MD;  Location: [facility];  Service: Gastroenterology;;   CESAREAN SECTION MULTI-GESTATIONAL N/A 02/03/2017   Procedure: CESAREAN SECTION MULTI-GESTATIONAL;  Surgeon: Tilda Burrow, MD;  Location: [facility] SU[facility];  Service: Obstetrics;  Laterality: N/A;   CHOLECYSTECTOMY     COLONOSCOPY WITH PROPOFOL N/A 08/13/2018   Procedure: COLONOSCOPY WITH PROPOFOL;  Surgeon: Benancio Deeds, MD;  Location: [facility] ENDOSCOPY;  Service: Gastroenterology;  Laterality: N/A;   DENTAL SURGERY     ESOPHAGOGASTRODUODENOSCOPY  May 2007   Dr. Jena Gauss: Normal esophagus, stomach, D1, D2   ESOPHAGOGASTRODUODENOSCOPY  06/16/2013   Dr. Jacinto Reap, eosinophilic esophagitis, reactive gastropathy, no esophageal dilation   ESOPHAGOGASTRODUODENOSCOPY (EGD) WITH PROPOFOL N/A 08/13/2018   Procedure: ESOPHAGOGASTRODUODENOSCOPY (EGD) WITH PROPOFOL;  Surgeon: Benancio Deeds, MD;  Location: [facility] ENDOSCOPY;  Service: Gastroenterology;  Laterality: N/A;   POLYPECTOMY  08/13/2018   Procedure: POLYPECTOMY;  Surgeon: Benancio Deeds, MD;  Location: [facility];  Service: Gastroenterology;;   TONSILLECTOMY     TOOTH EXTRACTION  10/28/2011   Procedure: DENTAL RESTORATION/EXTRACTIONS;  Surgeon: [name], DDS;  Location: [facility] OR;  Service: Oral Surgery;;   UPPER GASTROINTESTINAL ENDOSCOPY     Social History:  reports that she quit smoking about 11 years ago. Her smoking use included cigarettes. She started smoking about 19 years ago. She has a 4 pack-year smoking history. She has never used smokeless tobacco. She reports that she does not currently use  alcohol. She reports that she does not currently use drugs after having used the following drugs: Marijuana.  Allergies  Allergen Reactions   Amoxicillin Anaphylaxis   Bee Venom Shortness Of Breath and Swelling   Ciprofloxacin Anaphylaxis   Keflex [Cephalexin] Anaphylaxis   Penicillin G Anaphylaxis   Penicillins Anaphylaxis    Tolerates ceftriaxone Has patient had a PCN reaction causing immediate rash, facial/tongue/throat swelling, SOB or lightheadedness with hypotension: No Has patient had a PCN reaction causing severe rash involving mucus membranes or skin necrosis: No Has patient had a PCN reaction that required hospitalization No Has patient had a PCN reaction occurring within the last 10 years: No If all of the above answers are "NO", then may proceed with Cephalosporin use.'  REACTION: Angioedema   Adhesive [Tape] Itching and Rash   Latex Itching and Rash   Vancomycin Other (See Comments)    Pt can tolerate Vancomycin but did cause Vancomycin Infusion Reaction.  Recommend to pre-medicate with Benadryl before doses administered.      Family History  Problem Relation Age of Onset   Depression Mother    Anxiety disorder Mother    High blood pressure Mother    Bipolar disorder Mother    Eating disorder Mother    Obesity Mother    Hypertension Sister    Allergic rhinitis Sister    Colon polyps Maternal Grandmother        69s   Diabetes Maternal Grandmother    Anxiety disorder Maternal Grandmother    COPD Maternal Grandmother    Crohn's disease Maternal Aunt    Cancer Maternal Grandfather        prostate   HIV/AIDS Father    Eating disorder Father    Obesity Father    Liver disease Neg Hx    Angioedema Neg Hx    Eczema Neg Hx    Immunodeficiency Neg Hx    Asthma Neg Hx    Urticaria Neg Hx    Colon cancer Neg Hx    Esophageal cancer Neg Hx    Rectal cancer Neg Hx    Stomach cancer Neg Hx     Prior to Admission medications   Medication Sig Start Date End Date  Taking? Authorizing Provider  albuterol (VENTOLIN HFA) 108 (90 Base) MCG/ACT inhaler Inhale into the lungs every 6 (six) hours as needed for wheezing or shortness of breath.    [provider]  ARIPiprazole (ABILIFY) 5 MG tablet Take 1 tablet (5 mg total) by mouth daily. 07/24/21 01/20/22  Charm Rings, NP  ascorbic acid (VITAMIN C) 500 MG tablet Take 1 tablet (500 mg total) by mouth daily. 12/27/20   Russella Dar, NP  clonazePAM (KLONOPIN) 0.5 MG tablet Take 1 tablet (0.5 mg total) by mouth 2 (two) times daily. 07/24/21 07/24/22  Charm Rings, NP  diclofenac Sodium (VOLTAREN) 1 % GEL Apply 4 g topically 4 (four) times daily. Patient taking differently: Apply 4 g topically 3 (three) times daily as needed (knee/back pain). 12/27/20   Russella Dar, NP  doxycycline (VIBRA-TABS) 100 MG tablet Take 1 tablet by mouth twice a day for UTI 07/18/21  DULoxetine (CYMBALTA) 30 MG capsule Take 3 capsules (90 mg total) by mouth daily. 07/24/21 01/20/22  Charm Rings, NP  fluticasone (FLONASE) 50 MCG/ACT nasal spray Place 2 sprays into both nostrils 2 (two) times daily. Patient taking differently: Place 2 sprays into both nostrils 2 (two) times daily as needed for allergies or rhinitis. 12/27/20   Russella Dar, NP  furosemide (LASIX) 40 MG tablet Take 1 tablet (40 mg total) by mouth daily. 01/04/21   Russella Dar, NP  gabapentin (NEURONTIN) 100 MG capsule Take 1 capsule by mouth 3 times a day for anxiety. 07/18/21     Melatonin-Lemon Balm 01-999 MG-MCG TABS Take 1 tablet by mouth at bedtime as needed (sleep). 06/29/21   [provider]  methocarbamol (ROBAXIN) 500 MG tablet Take 1 tablet (500 mg total) by mouth 4 (four) times daily. Patient taking differently: Take 500 mg by mouth every 6 (six) hours as needed for muscle spasms. 12/27/20   Russella Dar, NP  mometasone-formoterol (DULERA) 100-5 MCG/ACT AERO Inhale 2 puffs into the lungs in the morning and at bedtime.     [provider]  montelukast (SINGULAIR) 10 MG tablet Take 1 tablet by mouth at bedtime for asthma. 07/18/21     nitrofurantoin, macrocrystal-monohydrate, (MACROBID) 100 MG capsule Take 1 capsule (100 mg total) by mouth 2 times daily for 3 days. 08/17/21     nitrofurantoin, macrocrystal-monohydrate, (MACROBID) 100 MG capsule Take 1 capsule (100 mg total) by mouth 2 times daily for 7 days. 10/01/21     nystatin (MYCOSTATIN/NYSTOP) powder Apply topically 3 (three) times daily. Patient taking differently: Apply 1 application. topically 3 (three) times daily as needed (rash/itching). 12/27/20   Russella Dar, NP  ondansetron (ZOFRAN ODT) 4 MG disintegrating tablet Take 1 tablet (4 mg total) by mouth every 8 (eight) hours as needed for nausea or vomiting. 02/08/21   Tilden Fossa, MD  oxybutynin (DITROPAN) 5 MG tablet Take 1 tablet (5 mg total) by mouth 3 (three) times daily as needed for up to 10 days. 10/01/21     potassium chloride SA (KLOR-CON M) 20 MEQ tablet Take 1 tablet (20 mEq total) by mouth daily. 01/04/21   Russella Dar, NP  pregabalin (LYRICA) 100 MG capsule Take 1 capsule (100 mg total) by mouth 3 (three) times daily. 12/27/20   Russella Dar, NP  rOPINIRole (REQUIP) 2 MG tablet Take 1 tablet (2 mg total) by mouth at bedtime for 30 days. 08/17/21     rOPINIRole (REQUIP) 4 MG tablet Take 1 tablet (4 mg total) by mouth at bedtime. 12/27/20   Russella Dar, NP  senna-docusate (SENOKOT-S) 8.6-50 MG tablet Take 2 tablets by mouth daily. Patient taking differently: Take 2 tablets by mouth daily as needed (constipation). 12/27/20   Russella Dar, NP  silver sulfADIAZINE (SILVADENE) 1 % cream Apply topically daily. Patient taking differently: Apply 1 application. topically daily as needed (wound care). 01/04/21   Russella Dar, NP  tiZANidine (ZANAFLEX) 2 MG tablet Take 1 tablet by mouth 3 times daily as needed for a muscle relaxer. 07/18/21     traMADol (ULTRAM) 50 MG tablet Take  1 tablet twice a day as needed for pain. Do not take within 2 hours of klonopin  (clonazepam) 07/18/21     vitamin B-12 (CYANOCOBALAMIN) 1000 MCG tablet Take 1 tablet (1,000 mcg total) by mouth daily. 12/27/20   Russella Dar, NP    Physical Exam: Vitals:   10/31/22  5784 10/31/22 0301 10/31/22 0415 10/31/22 0658  BP: 119/62  (!) 102/48   Pulse: 95  100   Resp: 20  20   Temp:  98.3 F (36.8 C)  99 F (37.2 C)  TempSrc:  Oral  Oral  SpO2: 96%  98%     Constitutional: NAD, calm, morbidly obese Eyes: PERRL, lids and conjunctivae normal ENMT: Mucous membranes are moist. Posterior pharynx clear of any exudate or lesions.  Neck: normal, supple, no masses, no thyromegaly Respiratory: Lung sounds distant, no adventitious sounds appreciated. No accessory muscle use.  Cardiovascular: Regular rate and rhythm, no murmurs / rubs / gallops. +2 radial, 1+ pedal pulses.  Abdomen: tenderness when pannus touched, no masses palpated. No hepatosplenomegaly. Bowel sounds positive.  Musculoskeletal: no clubbing / cyanosis. No joint deformity upper and lower extremities. Good ROM, no contractures. Normal muscle tone.  Skin: Inguinal folds and pannus erythematous and malodorous Neurologic:  Alert and oriented x 3. Normal mood.   Data Reviewed:  CBC    Component Value Date/Time   WBC 19.9 (H) 10/31/2022 0258   RBC 3.81 (L) 10/31/2022 0258   HGB 10.5 (L) 10/31/2022 0309   HGB 11.5 06/11/2018 1301   HCT 31.0 (L) 10/31/2022 0309   HCT 36.7 06/11/2018 1301   PLT 393 10/31/2022 0258   PLT 383 (H) 01/20/2017 1137   MCV 83.5 10/31/2022 0258   MCV 81 06/11/2018 1301   MCH 23.1 (L) 10/31/2022 0258   MCHC 27.7 (L) 10/31/2022 0258   RDW 19.5 (H) 10/31/2022 0258   RDW 14.3 06/11/2018 1301   LYMPHSABS 2.3 10/31/2022 0258   LYMPHSABS 1.8 06/11/2018 1301   MONOABS 0.8 10/31/2022 0258   EOSABS 0.6 (H) 10/31/2022 0258   EOSABS 0.3 06/11/2018 1301   BASOSABS 0.1 10/31/2022 0258   BASOSABS 0.0 06/11/2018  1301   CMP     Component Value Date/Time   NA 140 10/31/2022 0309   NA 140 06/11/2018 1301   K 4.7 10/31/2022 0309   CL 105 10/31/2022 0309   CO2 24 10/31/2022 0258   GLUCOSE 95 10/31/2022 0309   BUN 18 10/31/2022 0309   BUN 16 06/11/2018 1301   CREATININE 1.70 (H) 10/31/2022 0309   CREATININE 0.70 07/14/2014 1451   CALCIUM 8.8 (L) 10/31/2022 0258   PROT 7.8 10/31/2022 0258   PROT 7.1 06/11/2018 1301   ALBUMIN 3.1 (L) 10/31/2022 0258   ALBUMIN 3.9 06/11/2018 1301   AST 15 10/31/2022 0258   ALT 15 10/31/2022 0258   ALKPHOS 73 10/31/2022 0258   BILITOT 0.6 10/31/2022 0258   BILITOT 0.2 06/11/2018 1301   GFR 86.69 08/04/2018 1238   GFRNONAA 42 (L) 10/31/2022 0258   Alcohol Level    Component Value Date/Time   ETH <10 10/31/2022 0258    Urinalysis    Component Value Date/Time   COLORURINE YELLOW 10/31/2022 0529   APPEARANCEUR HAZY (A) 10/31/2022 0529   APPEARANCEUR Cloudy (A) 08/06/2016 1209   LABSPEC 1.014 10/31/2022 0529   PHURINE 5.0 10/31/2022 0529   GLUCOSEU NEGATIVE 10/31/2022 0529   HGBUR SMALL (A) 10/31/2022 0529   HGBUR large 04/19/2008 0952   BILIRUBINUR NEGATIVE 10/31/2022 0529   BILIRUBINUR Negative 08/06/2016 1209   KETONESUR NEGATIVE 10/31/2022 0529   PROTEINUR 30 (A) 10/31/2022 0529   UROBILINOGEN 0.2 12/06/2014 2049   NITRITE POSITIVE (A) 10/31/2022 0529   LEUKOCYTESUR LARGE (A) 10/31/2022 0529    Assessment and Plan: #Pyelonephritis #Recurrent UTI Recently admitted to [facility] with ESBL E.Coli UTI treated  with aztreonam. Endorses burning and itching on urination, increased frequency, episodes of urinary incontinence over the past few days. Also endorsing some urinary retention since yesterday morning having voided twice in 24 hours. Endorses (L) flank pain. Has chronic back pain that is unchanged. Denies CVA tenderness and none elicited on exam. WBC 19.9, Neutrophils 15.9. UA nitrite and leukocyte positive with MANY bacteria. Afebrile. BP 102/48 HR  94  - Meropenem started in ED, continue - Add on urine culture - IV Fluid hydration 50 mL/hr for 10hrs --> CXR showed pulmonary vascular congestion, will stop fluids. - Follow culture data - PRN analgesia - PRN antiemetics  #Chest Pain #Dyspnea Pain is reproducible on exam. Patient was admitted to [facility] at the end of June where she presented with Chest pain, cough, SOB and found to have pneumonia. Cardiac workup negative at that time and PE ruled out via V/Q scan. - EKG - Troponin - CXR - PRN Analgesia  #Chronic Kidney Disease CKD 3b  Creatinine 1.70 (Creatinine 1.6 at discharge from [facility] 10/03/22).  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 She reports voiding twice in 24 hrs. - Renal US - Bladder scan q6H PRN - Hold home Ditropan  #Pannus Intertrigo - Ketoconazole cream - Interdry  #Normocytic Anemia HGB 10.5, stable. Review of outside records show HGB 9.9 at discharge from [facility] 10/03/22 and was 8.8-10.4 during that admission - Follow Up AM CBC  # COPD #Asthma - Continue home inhaler and Singulair - PRN nebulizers while inpatient  #OSA - CPAP at bedtime  #Restless Leg Syndrome - Continue home Requip  #Depression #Anxiety #Schizoaffective Disorder #PTSD - Continue home Cymbalta, Abilify, Clonazepam  #Severe morbid obesity Body mass index is 93.18 kg/m. - Counseled on importance of weight reduction - Information provided on [facility]  VTE prophylaxis: Lovenox GI prophylaxis: Protonix Diet: Regular Access: PIV Lines: NONE Telemetry: Yes Disposition: Admit to Med Surg Telemetry   Advance Care Planning:   Code Status: Full Code   Consults: None  Family Communication: None at bedside, patient is able to independently update  family as desired  Severity of Illness: The appropriate patient status for this patient is INPATIENT. Inpatient status is judged to be reasonable and necessary in order to provide the required intensity of service to ensure the patient's safety. The patient's presenting symptoms, physical exam findings, and initial radiographic and laboratory data in the context of their chronic comorbidities is felt to place them at high risk for further clinical deterioration. Furthermore, it is not anticipated that the patient will be medically stable for discharge from the hospital within 2 midnights of admission.   * I certify that at the point of admission it is my clinical judgment that the patient will require inpatient hospital care spanning beyond 2 midnights from the point of admission due to high intensity of service, high risk for further deterioration and high frequency of surveillance required.*  To reach the provider On-Call:   7AM- 7PM see care teams to locate the attending and reach out to them via [email or web address] Password: [email or web address] 7PM-7AM contact night-coverage If you still have difficulty reaching the appropriate provider, please page the [facility] (Director on Call) for [facility] on amion for assistance  This document was prepared using Conservation officer, historic buildings and may include unintentional dictation errors.  Orpha Bur  Jonh Mcqueary FNP-BC, PMHNP-BC Nurse Practitioner [facility]

## 2022-10-31 NOTE — ED Notes (Signed)
ED TO INPATIENT HANDOFF REPORT  Name/Age/Gender Heather [name] 47 y.o. female  Code Status    Code Status Orders  (From admission, onward)           Start     Ordered   10/31/22 0711  Full code  Continuous       Question:  By:  Answer:  Consent: discussion documented in EHR   10/31/22 0711           Code Status History     Date Active Date Inactive Code Status Order ID Comments User Context   07/09/2021 0236 07/10/2021 1551 Full Code [id number]  Roxy Horseman, PA-C ED   02/26/2021 1748 02/28/2021 1832 Full Code [id number]  Darlin Drop, DO Inpatient   02/16/2021 0011 02/16/2021 2147 Full Code [id number]  Paula Libra, MD ED   02/05/2021 2241 02/07/2021 1601 Full Code [id number]  Jacalyn Lefevre, MD ED   01/26/2021 0929 02/04/2021 1126 Full Code [id number]  Jonah Blue, MD ED   10/31/2020 1548 01/06/2021 0012 Full Code [id number]  Orland Mustard, MD ED   08/24/2020 1621 09/14/2020 2156 Full Code [id number]  Jacquelynn Cree, PA-C Inpatient   08/18/2020 2151 08/24/2020 1602 Full Code [id number]  Eduard Clos, MD ED   08/10/2020 1751 08/18/2020 1704 Full Code [id number]  Dorcas Carrow, MD Inpatient   02/03/2017 1352 02/06/2017 1732 Full Code [id number]  Tilda Burrow, MD Inpatient   12/07/2014 0130 12/09/2014 2100 Full Code [id number]  Meredeth Ide, MD Inpatient   11/11/2014 1805 11/13/2014 1929 Full Code [id number]  Gwenyth Bender, NP Inpatient   08/02/2011 0247 08/12/2011 2036 Full Code [id number]  Rudene Christians, RN Inpatient   07/31/2011 2027 08/02/2011 0247 Full Code [id number]  Glynn Octave, MD ED   07/28/2011 2133 07/29/2011 2036 Full Code [id number]  [name], MD ED   07/28/2011 2132 07/28/2011 2133 Full Code [id number]  [name], MD ED       Home/SNF/Other Home  Chief Complaint Pyelonephritis [N12] Pyelonephritis, acute [N10]  Level of Care/Admitting Diagnosis ED Disposition     ED Disposition  Admit   Condition  --   Comment  Hospital Area[facility] [[id number]]  Level of Care: Telemetry [5]  Admit to tele based on following criteria: Monitor for Ischemic changes  May admit patient to [facility] or [facility] if equivalent level of care is available:: No  Covid Evaluation: Asymptomatic - no recent exposure (last 10 days) testing not required  Diagnosis: Pyelonephritis, acute [829937]  Admitting Physician: Bobette Mo [[id number]]  Attending Physician: Bobette Mo [phone]  Certification:: I certify this patient will need inpatient services for at least 2 midnights  Estimated Length of Stay: 2          Medical History Past Medical History:  Diagnosis Date   Anxiety    Arthritis    Asthma    Back pain    Constipation    COPD (chronic obstructive pulmonary disease) (HCC)    Depression    Diabetes mellitus without complication (HCC)    patient denies but states she has hyperglycemia-diet controlled   Dysrhythmia    DR Eden Emms     Ectopic pregnancy 2013   Eosinophilic esophagitis    Diagnosed at [facility] 06/16/2013, untreated   GERD (gastroesophageal reflux disease)    HEARTBURN   TUMS   High cholesterol    IBS (irritable bowel syndrome)    Leukocytosis 07/28/2008   Qualifier: Diagnosis  of  By: [name] MD, Remi Haggard     Morbid obesity [facility])    Neuromuscular disorder (HCC)    RESTLESS LEG    Obesity    Schizoaffective disorder, bipolar type (HCC)    Sleep apnea    CPAP- in process of restarting     Allergies Allergies  Allergen Reactions   Amoxicillin Anaphylaxis   Bee Venom Shortness Of Breath and Swelling   Ciprofloxacin Anaphylaxis   Keflex [Cephalexin] Anaphylaxis   Penicillin G Anaphylaxis   Penicillins Anaphylaxis    Tolerates ceftriaxone Has patient had a PCN reaction causing immediate rash, facial/tongue/throat swelling, SOB or lightheadedness with hypotension: No Has patient had a PCN reaction causing severe rash involving mucus membranes or skin necrosis: No Has  patient had a PCN reaction that required hospitalization No Has patient had a PCN reaction occurring within the last 10 years: No If all of the above answers are "NO", then may proceed with Cephalosporin use.'  REACTION: Angioedema   Adhesive [Tape] Itching and Rash   Latex Itching and Rash   Vancomycin Other (See Comments)    Pt can tolerate Vancomycin but did cause Vancomycin Infusion Reaction.  Recommend to pre-medicate with Benadryl before doses administered.      IV Location/Drains/Wounds Patient Lines/Drains/Airways Status     Active Line/Drains/Airways     Name Placement date Placement time Site Days   Peripheral IV 10/31/22 22 G Posterior;Proximal;Right Forearm 10/31/22  0300  Forearm  less than 1            Labs/Imaging Results for orders placed or performed during the hospital encounter of 10/30/22 (from the past 48 hour(s))  CBG monitoring, ED     Status: None   Collection Time: 10/31/22  1:04 AM  Result Value Ref Range   Glucose-Capillary 82 70 - 99 mg/dL    Comment: Glucose reference range applies only to samples taken after fasting for at least 8 hours.  hCG, serum, qualitative     Status: None   Collection Time: 10/31/22  2:58 AM  Result Value Ref Range   Preg, Serum NEGATIVE NEGATIVE    Comment:        THE SENSITIVITY OF THIS METHODOLOGY IS >10 mIU/mL. Performed at [facility], [address] [place], [place] [address]   CBC with Differential (PNL)     Status: Abnormal   Collection Time: 10/31/22  2:58 AM  Result Value Ref Range   WBC 19.9 (H) 4.0 - 10.5 K/uL   RBC 3.81 (L) 3.87 - 5.11 MIL/uL   Hemoglobin 8.8 (L) 12.0 - 15.0 g/dL   HCT 62.1 (L) 30.8 - 65.7 %   MCV 83.5 80.0 - 100.0 fL   MCH 23.1 (L) 26.0 - 34.0 pg   MCHC 27.7 (L) 30.0 - 36.0 g/dL   RDW 84.6 (H) 96.2 - 95.2 %   Platelets 393 150 - 400 K/uL   nRBC 0.0 0.0 - 0.2 %   Neutrophils Relative % 79 %   Neutro Abs 15.9 (H) 1.7 - 7.7 K/uL   Lymphocytes Relative 12 %    Lymphs Abs 2.3 0.7 - 4.0 K/uL   Monocytes Relative 4 %   Monocytes Absolute 0.8 0.1 - 1.0 K/uL   Eosinophils Relative 3 %   Eosinophils Absolute 0.6 (H) 0.0 - 0.5 K/uL   Basophils Relative 0 %   Basophils Absolute 0.1 0.0 - 0.1 K/uL   Immature Granulocytes 2 %   Abs Immature Granulocytes 0.29 (H) 0.00 -  0.07 K/uL    Comment: Performed at [facility], [address] [place], [place] [address]  Ethanol     Status: None   Collection Time: 10/31/22  2:58 AM  Result Value Ref Range   Alcohol, Ethyl (B) <10 <10 mg/dL    Comment: (NOTE) Lowest detectable limit for serum alcohol is 10 mg/dL.  For medical purposes only. Performed at [facility], [address], [place], [place] [address]   Comprehensive metabolic panel     Status: Abnormal   Collection Time: 10/31/22  2:58 AM  Result Value Ref Range   Sodium 137 135 - 145 mmol/L   Potassium 4.1 3.5 - 5.1 mmol/L   Chloride 103 98 - 111 mmol/L   CO2 24 22 - 32 mmol/L   Glucose, Bld 95 70 - 99 mg/dL    Comment: Glucose reference range applies only to samples taken after fasting for at least 8 hours.   BUN 16 6 - 20 mg/dL   Creatinine, Ser 0.25 (H) 0.44 - 1.00 mg/dL   Calcium 8.8 (L) 8.9 - 10.3 mg/dL   Total Protein 7.8 6.5 - 8.1 g/dL   Albumin 3.1 (L) 3.5 - 5.0 g/dL   AST 15 15 - 41 U/L   ALT 15 0 - 44 U/L   Alkaline Phosphatase 73 38 - 126 U/L   Total Bilirubin 0.6 0.3 - 1.2 mg/dL   GFR, Estimated 42 (L) >60 mL/min    Comment: (NOTE) Calculated using the CKD-EPI Creatinine Equation (2021)    Anion gap 10 5 - 15    Comment: Performed at [facility], [address], [place], [place] [address]  Blood gas, venous (at [facility])     Status: Abnormal   Collection Time: 10/31/22  2:58 AM  Result Value Ref Range   pH, Ven 7.37 7.25 - 7.43   pCO2, Ven 46 44 - 60 mmHg   pO2, Ven 56 (H) 32 - 45 mmHg   Bicarbonate 26.6 20.0 - 28.0 mmol/L   Acid-Base Excess 0.8 0.0 - 2.0 mmol/L    O2 Saturation 90.7 %   Patient temperature 37.0     Comment: Performed at [facility], [address] [place], [place] [address]  I-stat chem 8, ED     Status: Abnormal   Collection Time: 10/31/22  3:09 AM  Result Value Ref Range   Sodium 140 135 - 145 mmol/L   Potassium 4.7 3.5 - 5.1 mmol/L   Chloride 105 98 - 111 mmol/L   BUN 18 6 - 20 mg/dL   Creatinine, Ser 8.31 (H) 0.44 - 1.00 mg/dL   Glucose, Bld 95 70 - 99 mg/dL    Comment: Glucose reference range applies only to samples taken after fasting for at least 8 hours.   Calcium, Ion 1.19 1.15 - 1.40 mmol/L   TCO2 28 22 - 32 mmol/L   Hemoglobin 10.5 (L) 12.0 - 15.0 g/dL   HCT 51.7 (L) 61.6 - 07.3 %  Urinalysis, Routine w reflex microscopic -Urine, Catheterized     Status: Abnormal   Collection Time: 10/31/22  5:29 AM  Result Value Ref Range   Color, Urine YELLOW YELLOW   APPearance HAZY (A) CLEAR   Specific Gravity, Urine 1.014 1.005 - 1.030   pH 5.0 5.0 - 8.0   Glucose, UA NEGATIVE NEGATIVE mg/dL   Hgb urine dipstick SMALL (A) NEGATIVE   Bilirubin Urine NEGATIVE NEGATIVE   Ketones, ur NEGATIVE  NEGATIVE mg/dL   Protein, ur 30 (A) NEGATIVE mg/dL   Nitrite POSITIVE (A) NEGATIVE   Leukocytes,Ua LARGE (A) NEGATIVE   RBC / HPF 6-10 0 - 5 RBC/hpf   WBC, UA >50 0 - 5 WBC/hpf   Bacteria, UA MANY (A) NONE SEEN   Squamous Epithelial / HPF 0-5 0 - 5 /HPF   Mucus PRESENT     Comment: Performed at [facility], [address] [place], [place] [address]   No results found.  Pending Labs Unresulted Labs (From admission, onward)     Start     Ordered   11/01/22 0500  HIV Antibody (routine testing w rflx)  (HIV Antibody (Routine testing w reflex) panel)  Tomorrow morning,   R        10/31/22 0711   11/01/22 0500  CBC  Tomorrow morning,   R        10/31/22 0711   11/01/22 0500  Comprehensive metabolic panel  Tomorrow morning,   R        10/31/22 0711   10/31/22 0529  Urine Culture  Once,   R         10/31/22 0529            Vitals/Pain Today's Vitals   10/31/22 0212 10/31/22 0301 10/31/22 0415 10/31/22 0658  BP: 119/62  (!) 102/48   Pulse: 95  100   Resp: 20  20   Temp:  98.3 F (36.8 C)  99 F (37.2 C)  TempSrc:  Oral  Oral  SpO2: 96%  98%   PainSc:        Isolation Precautions No active isolations  Medications Medications  acetaminophen (TYLENOL) tablet 1,000 mg (has no administration in time range)    Or  acetaminophen (TYLENOL) suppository 650 mg (has no administration in time range)  prochlorperazine (COMPAZINE) injection 10 mg (has no administration in time range)  meropenem (MERREM) 1 g in sodium chloride 0.9 % 100 mL IVPB (has no administration in time range)    Mobility non-ambulatory

## 2022-10-31 NOTE — Evaluation (Signed)
Physical Therapy Evaluation Patient Details Name: Heather [name] MRN: [id number] DOB: 1976-03-15 Today's Date: 10/31/2022  History of Present Illness  Heather [name] is a 47 y.o. female presents with several days of generalized fatigue. PMH: anxiety, arthritis, asthma, back pain, COPD, depression, diabetes, GERD, IBS, morbid obesity, neuromuscular disorder, schizoaffective disorder,  Clinical Impression  Pt admitted with above diagnosis. Pt reports going to SNF and transitioning to LTC then d/c to hotel on 10/18/22. Pt reports she was ambulating 168 ft with RW, was walking from bed/couch to bathroom in hotel, had a friend assisting with self care tasks. Pt currently needing min A to come to sitting EOB, limited sitting tolerance and quickly requests to return to supine. Pt able to scoot up in bed with BUE assisting on headboard and trendelenburg bed position. Pt unable to attempt transfers due to limited sitting tolerance. Patient will benefit from continued inpatient follow up therapy, <3 hours/day. Pt currently with functional limitations due to the deficits listed below (see PT Problem List). Pt will benefit from acute skilled PT to increase their independence and safety with mobility to allow discharge.           Assistance Recommended at Discharge Frequent or constant Supervision/Assistance  If plan is discharge home, recommend the following:  Can travel by private vehicle  A lot of help with bathing/dressing/bathroom;Assistance with cooking/housework;Assist for transportation;Help with stairs or ramp for entrance;Two people to help with walking and/or transfers   No    Equipment Recommendations None recommended by PT  Recommendations for Other Services       Functional Status Assessment Patient has had a recent decline in their functional status and demonstrates the ability to make significant improvements in function in a reasonable and predictable amount of time.      Precautions / Restrictions Precautions Precautions: Fall Restrictions Weight Bearing Restrictions: No      Mobility  Bed Mobility Overal bed mobility: Needs Assistance Bed Mobility: Supine to Sit, Sit to Supine     Supine to sit: Min assist Sit to supine: Min assist   General bed mobility comments: min A to upright trunk into sitting, min A to lift BLE back into bed, pt tolerates sitting EOB for ~10 sec then requests to return to sitting    Transfers                        Ambulation/Gait                  Stairs            Wheelchair Mobility     Tilt Bed    Modified Rankin (Stroke Patients Only)       Balance Overall balance assessment: Needs assistance Sitting-balance support: Feet supported Sitting balance-Leahy Scale: Fair Sitting balance - Comments: [place] SUPV for ~10 sec                                     Pertinent Vitals/Pain Pain Assessment Pain Assessment: Faces Faces Pain Scale: Hurts even more Pain Location: R lower leg with movement Pain Descriptors / Indicators: Discomfort Pain Intervention(s): Limited activity within patient's tolerance, Monitored during session, Repositioned    Home Living Family/patient expects to be discharged to:: Other (Comment)                   Additional Comments: Pt reports living  in motel since d/c from SNF on October 18, 2022. Pt reports she has a RW and she was able to ambulate 168 ft with RW when left SNF. Pt reports no other DME.    Prior Function Prior Level of Function : Independent/Modified Independent;History of Falls (last six months)             Mobility Comments: pt reports using RW when d/c from SNF, ambulating from bed or couch to restroom with RW in hotel ADLs Comments: pt reports needing assist with self care, had a friend who assisted with bathing but he can't come daily     Hand Dominance        Extremity/Trunk Assessment   Upper Extremity  Assessment Upper Extremity Assessment: Defer to OT evaluation    Lower Extremity Assessment Lower Extremity Assessment: Generalized weakness       Communication   Communication: No difficulties  Cognition Arousal/Alertness: Awake/alert Behavior During Therapy: WFL for tasks assessed/performed Overall Cognitive Status: Within Functional Limits for tasks assessed                                          General Comments      Exercises     Assessment/Plan    PT Assessment Patient needs continued PT services  PT Problem List Decreased strength;Decreased range of motion;Decreased activity tolerance;Decreased balance;Decreased mobility;Decreased safety awareness;Cardiopulmonary status limiting activity;Pain;Obesity;Decreased knowledge of use of DME       PT Treatment Interventions DME instruction;Gait training;Functional mobility training;Therapeutic activities;Therapeutic exercise;Balance training;Neuromuscular re-education;Patient/family education;Wheelchair mobility training    PT Goals (Current goals can be found in the Care Plan section)  Acute Rehab PT Goals Patient Stated Goal: "see my kids" PT Goal Formulation: With patient Time For Goal Achievement: 11/14/22 Potential to Achieve Goals: Fair    Frequency Min 1X/week     Co-evaluation               AM-PAC PT "6 Clicks" Mobility  Outcome Measure Help needed turning from your back to your side while in a flat bed without using bedrails?: A Little Help needed moving from lying on your back to sitting on the side of a flat bed without using bedrails?: A Little Help needed moving to and from a bed to a chair (including a wheelchair)?: Total Help needed standing up from a chair using your arms (e.g., wheelchair or bedside chair)?: Total Help needed to walk in hospital room?: Total Help needed climbing 3-5 steps with a railing? : Total 6 Click Score: 10    End of Session   Activity Tolerance:  Patient tolerated treatment well Patient left: in bed;with call bell/phone within reach Nurse Communication: Mobility status PT Visit Diagnosis: Other abnormalities of gait and mobility (R26.89);Muscle weakness (generalized) (M62.81);Pain Pain - Right/Left: Right Pain - part of body: Leg    Time: 7829-5621 PT Time Calculation (min) (ACUTE ONLY): 23 min   Charges:   PT Evaluation $PT Eval Low Complexity: 1 Low PT Treatments $Therapeutic Activity: 8-22 mins PT General Charges $$ ACUTE PT VISIT: 1 Visit         Tori Pamila Mendibles PT, DPT 10/31/22, 3:33 PM

## 2022-10-31 NOTE — TOC Initial Note (Signed)
Transition of Care [facility]) - Initial/Assessment Note    Patient Details  Name: Heather [name] MRN: [id number] Date of Birth: 09/13/1975  Transition of Care [facility]) CM/SW Contact:    Beckie Busing, RN Phone Number: [phone]   10/31/2022, 3:27 PM  Clinical Narrative:                 [facility] acknowledges consult for "Recently left nursing facility last month, Homeless lives in [place]" [place] at bedside for assessment. Patient states that she is from  hotel where she lives alone. Patient states that she was st SNF for LTC in [place]. Patient states that she left facility thinking that she was able to take care of him at home. Patient states that she now realizes that she can not care for herself and would like to be reconsidered for long term care at a facility. Patient states that she has been in contact with [facility] and [facility] in [facility]. CM has made patient aware that PT will evaluate and make recommendations for level of care and that CM will initiate bed search. TOC will continue to follow for disposition planning.    Expected Discharge Plan: Skilled Nursing Facility Barriers to Discharge: Continued Medical Work up   Patient Goals and CMS Choice Patient states their goals for this hospitalization and ongoing recovery are:: Patient want          Expected Discharge Plan and Services In-house Referral: NA Discharge Planning Services: CM Consult   Living arrangements for the past 2 months: Hotel/Motel                 DME Arranged: N/A DME Agency: NA       HH Arranged: NA HH Agency: NA        Prior Living Arrangements/Services Living arrangements for the past 2 months: Hotel/Motel Lives with:: Self Patient language and need for interpreter reviewed:: Yes Do you feel safe going back to the place where you live?: Yes      Need for Family Participation in Patient Care: Yes (Comment)   Current home services:  (n/a) Criminal Activity/Legal Involvement  Pertinent to Current Situation/Hospitalization: No - Comment as needed  Activities of Daily Living Home Assistive Devices/Equipment: Environmental consultant (specify type), Eyeglasses ADL Screening (condition at time of admission) Patient's cognitive ability adequate to safely complete daily activities?: Yes Is the patient deaf or have difficulty hearing?: No Does the patient have difficulty seeing, even when wearing glasses/contacts?: No Does the patient have difficulty concentrating, remembering, or making decisions?: No Patient able to express need for assistance with ADLs?: Yes Does the patient have difficulty dressing or bathing?: Yes Independently performs ADLs?: No Communication: Independent Dressing (OT): Needs assistance Is this a change from baseline?: Pre-admission baseline Grooming: Dependent Feeding: Independent Bathing: Dependent, Needs assistance Is this a change from baseline?: Pre-admission baseline Toileting: Needs assistance Is this a change from baseline?: Pre-admission baseline In/Out Bed: Needs assistance Is this a change from baseline?: Pre-admission baseline Walks in Home: Needs assistance Is this a change from baseline?: Pre-admission baseline Does the patient have difficulty walking or climbing stairs?: Yes Weakness of Legs: Both Weakness of Arms/Hands: None  Permission Sought/Granted Permission sought to share information with : Case Manager Permission granted to share information with : No              Emotional Assessment Appearance:: Appears stated age Attitude/Demeanor/Rapport: Gracious Affect (typically observed): Tearful/Crying Orientation: : Oriented to Self, Oriented to Place, Oriented to  Time,  Oriented to Situation Alcohol / Substance Use: Not Applicable Psych Involvement: No (comment)  Admission diagnosis:  Pyelonephritis, acute [N10] Pyelonephritis [N12] Patient Active Problem List   Diagnosis Date Noted   Pyelonephritis 10/31/2022    Pyelonephritis, acute 10/31/2022   Intertrigo 10/31/2022   Depression due to physical illness 02/19/2022   MDD (major depressive disorder), recurrent, in partial remission (HCC) 10/15/2021   Urinary retention 02/26/2021   Homelessness 02/07/2021   Cystitis 01/26/2021   Social problem 01/26/2021   Enterococcus faecalis infection 01/26/2021   Mood disorder (HCC) 01/26/2021   Chronic pain disorder 01/26/2021   MDD (major depressive disorder), recurrent severe, without psychosis (HCC) 11/06/2020   Thrombocytosis 10/31/2020   CKD (chronic kidney disease), stage III (HCC) 10/31/2020   Major depressive disorder, recurrent (HCC) 09/13/2020   Insomnia    Anemia of chronic disease    Chronic bilateral low back pain without sciatica    Debility 08/24/2020   SIRS (systemic inflammatory response syndrome) (HCC) 08/18/2020   Pressure injury of skin 08/11/2020   Rhabdomyolysis 08/10/2020   Acute renal failure (HCC) 08/10/2020   PTSD (post-traumatic stress disorder) 05/15/2020   Major depressive disorder, recurrent episode, moderate (HCC) 05/05/2020   Other specified anxiety disorders 05/05/2020   Blood in stool    Gastritis and gastroduodenitis    Benign neoplasm of sigmoid colon    Class 3 severe obesity with serious comorbidity and body mass index (BMI) greater than or equal to 70 in adult (HCC) 06/16/2018   Nexplanon insertion 03/27/2017   Postpartum hypertension 03/05/2017   Perennial allergic rhinitis 11/05/2016   Mild persistent asthma with acute exacerbation 11/05/2016   Hypoglycemia 11/07/2015   OSA (obstructive sleep apnea) 11/07/2015   DM type 2 (diabetes mellitus, type 2) (HCC) 12/07/2014   Panniculitis 12/06/2014   Cellulitis, abdominal wall 11/11/2014   Abdominal pain 07/14/2014   Eosinophilic esophagitis 07/22/2013   Change in bowel habits 04/28/2013   RLS (restless legs syndrome) 08/08/2011   Adjustment disorder 08/06/2011   DYSMETABOLIC SYNDROME 05/13/2006   Essential  hypertension 05/12/2006   Asthma 05/12/2006   OSTEOARTHRITIS 05/12/2006   PCP:  Renaye Rakers, MD Pharmacy:   [facility] [address], [address] [place] [address] Phone: [phone] Fax: [phone]  [facility] [address] [place] [address] Phone: [phone] Fax: [phone]     Social Determinants of Health (SDOH) Social History: SDOH Screenings   Food Insecurity: No Food Insecurity (10/31/2022)  Housing: Patient Declined (10/31/2022)  Transportation Needs: Unmet Transportation Needs (10/31/2022)  Utilities: Not At Risk (10/31/2022)  Depression (PHQ2-9): Medium Risk (04/22/2022)  Social Connections: Unknown (09/30/2022)   Received from [facility], [facility]  Stress: No Stress Concern Present (10/01/2022)   Received from [facility], [facility]  Tobacco Use: Medium Risk (10/30/2022)   SDOH Interventions:     Readmission Risk Interventions    10/31/2022    2:42 PM 12/08/2020    1:37 PM  Readmission Risk Prevention Plan  Transportation Screening  Complete  PCP or Specialist Appt within 5-7 Days Complete   Home Care Screening Complete   Medication Review (RN CM) Referral to Pharmacy   Medication Review (RN Care Manager)  Complete  PCP or Specialist appointment within 3-5 days of discharge  Complete  HRI or Home Care Consult  Complete  SW Recovery Care/Counseling Consult  Complete  Palliative Care Screening  Not Applicable  Skilled Nursing Facility  Not Applicable

## 2022-10-31 NOTE — ED Notes (Addendum)
Unsuccessful [place] PIV attempt x2, placed IV team consult. Notified EDP.

## 2022-10-31 NOTE — ED Provider Notes (Signed)
[place] EMERGENCY DEPARTMENT AT [facility] Provider Note  CSN: [id number] Arrival date & time: 10/30/22 2251  Chief Complaint(s) Weakness  HPI Heather [name] is a 47 y.o. female with extensive past medical history listed below who presents to the emergency department with several days of generalized fatigue.  Patient reports that she has had difficulty getting up recently.  Reports that she has a history of renal stones.  Endorsing frequency.  No nausea or vomiting.  The history is provided by the patient.    Past Medical History Past Medical History:  Diagnosis Date   Anxiety    Arthritis    Asthma    Back pain    Constipation    COPD (chronic obstructive pulmonary disease) (HCC)    Depression    Diabetes mellitus without complication (HCC)    patient denies but states she has hyperglycemia-diet controlled   Dysrhythmia    DR Eden Emms     Ectopic pregnancy 2013   Eosinophilic esophagitis    Diagnosed at [facility] 06/16/2013, untreated   GERD (gastroesophageal reflux disease)    HEARTBURN   TUMS   High cholesterol    IBS (irritable bowel syndrome)    Leukocytosis 07/28/2008   Qualifier: Diagnosis of  By: [name] MD, Cornelius     Morbid obesity [facility])    Neuromuscular disorder (HCC)    RESTLESS LEG    Obesity    Schizoaffective disorder, bipolar type (HCC)    Sleep apnea    CPAP- in process of restarting    Patient Active Problem List   Diagnosis Date Noted   Pyelonephritis 10/31/2022   Pyelonephritis, acute 10/31/2022   Depression due to physical illness 02/19/2022   MDD (major depressive disorder), recurrent, in partial remission (HCC) 10/15/2021   Urinary retention 02/26/2021   Homelessness 02/07/2021   Cystitis 01/26/2021   Social problem 01/26/2021   Enterococcus faecalis infection 01/26/2021   Mood disorder (HCC) 01/26/2021   Chronic pain disorder 01/26/2021   MDD (major depressive disorder), recurrent severe, without psychosis (HCC)  11/06/2020   Thrombocytosis 10/31/2020   CKD (chronic kidney disease), stage III (HCC) 10/31/2020   Major depressive disorder, recurrent (HCC) 09/13/2020   Insomnia    Anemia of chronic disease    Chronic bilateral low back pain without sciatica    Debility 08/24/2020   SIRS (systemic inflammatory response syndrome) (HCC) 08/18/2020   Pressure injury of skin 08/11/2020   Rhabdomyolysis 08/10/2020   Acute renal failure (HCC) 08/10/2020   PTSD (post-traumatic stress disorder) 05/15/2020   Major depressive disorder, recurrent episode, moderate (HCC) 05/05/2020   Other specified anxiety disorders 05/05/2020   Blood in stool    Gastritis and gastroduodenitis    Benign neoplasm of sigmoid colon    Class 3 severe obesity with serious comorbidity and body mass index (BMI) greater than or equal to 70 in adult (HCC) 06/16/2018   Nexplanon insertion 03/27/2017   Postpartum hypertension 03/05/2017   Perennial allergic rhinitis 11/05/2016   Mild persistent asthma with acute exacerbation 11/05/2016   Hypoglycemia 11/07/2015   OSA (obstructive sleep apnea) 11/07/2015   DM type 2 (diabetes mellitus, type 2) (HCC) 12/07/2014   Panniculitis 12/06/2014   Cellulitis, abdominal wall 11/11/2014   Abdominal pain 07/14/2014   Eosinophilic esophagitis 07/22/2013   Change in bowel habits 04/28/2013   RLS (restless legs syndrome) 08/08/2011   Adjustment disorder 08/06/2011   DYSMETABOLIC SYNDROME 05/13/2006   Essential hypertension 05/12/2006   Asthma 05/12/2006   OSTEOARTHRITIS 05/12/2006  Home Medication(s) Prior to Admission medications   Medication Sig Start Date End Date Taking? Authorizing Provider  albuterol (VENTOLIN HFA) 108 (90 Base) MCG/ACT inhaler Inhale into the lungs every 6 (six) hours as needed for wheezing or shortness of breath.    [provider]  ARIPiprazole (ABILIFY) 5 MG tablet Take 1 tablet (5 mg total) by mouth daily. 07/24/21 01/20/22  Charm Rings, NP  ascorbic  acid (VITAMIN C) 500 MG tablet Take 1 tablet (500 mg total) by mouth daily. 12/27/20   Russella Dar, NP  clonazePAM (KLONOPIN) 0.5 MG tablet Take 1 tablet (0.5 mg total) by mouth 2 (two) times daily. 07/24/21 07/24/22  Charm Rings, NP  diclofenac Sodium (VOLTAREN) 1 % GEL Apply 4 g topically 4 (four) times daily. Patient taking differently: Apply 4 g topically 3 (three) times daily as needed (knee/back pain). 12/27/20   Russella Dar, NP  doxycycline (VIBRA-TABS) 100 MG tablet Take 1 tablet by mouth twice a day for UTI 07/18/21     DULoxetine (CYMBALTA) 30 MG capsule Take 3 capsules (90 mg total) by mouth daily. 07/24/21 01/20/22  Charm Rings, NP  fluticasone (FLONASE) 50 MCG/ACT nasal spray Place 2 sprays into both nostrils 2 (two) times daily. Patient taking differently: Place 2 sprays into both nostrils 2 (two) times daily as needed for allergies or rhinitis. 12/27/20   Russella Dar, NP  furosemide (LASIX) 40 MG tablet Take 1 tablet (40 mg total) by mouth daily. 01/04/21   Russella Dar, NP  gabapentin (NEURONTIN) 100 MG capsule Take 1 capsule by mouth 3 times a day for anxiety. 07/18/21     Melatonin-Lemon Balm 01-999 MG-MCG TABS Take 1 tablet by mouth at bedtime as needed (sleep). 06/29/21   [provider]  methocarbamol (ROBAXIN) 500 MG tablet Take 1 tablet (500 mg total) by mouth 4 (four) times daily. Patient taking differently: Take 500 mg by mouth every 6 (six) hours as needed for muscle spasms. 12/27/20   Russella Dar, NP  mometasone-formoterol (DULERA) 100-5 MCG/ACT AERO Inhale 2 puffs into the lungs in the morning and at bedtime.    [provider]  montelukast (SINGULAIR) 10 MG tablet Take 1 tablet by mouth at bedtime for asthma. 07/18/21     nitrofurantoin, macrocrystal-monohydrate, (MACROBID) 100 MG capsule Take 1 capsule (100 mg total) by mouth 2 times daily for 3 days. 08/17/21     nitrofurantoin, macrocrystal-monohydrate, (MACROBID) 100 MG capsule  Take 1 capsule (100 mg total) by mouth 2 times daily for 7 days. 10/01/21     nystatin (MYCOSTATIN/NYSTOP) powder Apply topically 3 (three) times daily. Patient taking differently: Apply 1 application. topically 3 (three) times daily as needed (rash/itching). 12/27/20   Russella Dar, NP  ondansetron (ZOFRAN ODT) 4 MG disintegrating tablet Take 1 tablet (4 mg total) by mouth every 8 (eight) hours as needed for nausea or vomiting. 02/08/21   Tilden Fossa, MD  oxybutynin (DITROPAN) 5 MG tablet Take 1 tablet (5 mg total) by mouth 3 (three) times daily as needed for up to 10 days. 10/01/21     potassium chloride SA (KLOR-CON M) 20 MEQ tablet Take 1 tablet (20 mEq total) by mouth daily. 01/04/21   Russella Dar, NP  pregabalin (LYRICA) 100 MG capsule Take 1 capsule (100 mg total) by mouth 3 (three) times daily. 12/27/20   Russella Dar, NP  rOPINIRole (REQUIP) 2 MG tablet Take 1 tablet (2 mg total) by mouth at  bedtime for 30 days. 08/17/21     rOPINIRole (REQUIP) 4 MG tablet Take 1 tablet (4 mg total) by mouth at bedtime. 12/27/20   Russella Dar, NP  senna-docusate (SENOKOT-S) 8.6-50 MG tablet Take 2 tablets by mouth daily. Patient taking differently: Take 2 tablets by mouth daily as needed (constipation). 12/27/20   Russella Dar, NP  silver sulfADIAZINE (SILVADENE) 1 % cream Apply topically daily. Patient taking differently: Apply 1 application. topically daily as needed (wound care). 01/04/21   Russella Dar, NP  tiZANidine (ZANAFLEX) 2 MG tablet Take 1 tablet by mouth 3 times daily as needed for a muscle relaxer. 07/18/21     traMADol (ULTRAM) 50 MG tablet Take 1 tablet twice a day as needed for pain. Do not take within 2 hours of klonopin  (clonazepam) 07/18/21     vitamin B-12 (CYANOCOBALAMIN) 1000 MCG tablet Take 1 tablet (1,000 mcg total) by mouth daily. 12/27/20   Russella Dar, NP                                                                                                                                     Allergies Amoxicillin, Bee venom, Ciprofloxacin, Keflex [cephalexin], Penicillin g, Penicillins, Adhesive [tape], Latex, and Vancomycin  Review of Systems Review of Systems As noted in HPI  Physical Exam Vital Signs  I have reviewed the triage vital signs BP (!) 102/48   Pulse 100   Temp 99 F (37.2 C) (Oral)   Resp 20   SpO2 98%   Physical Exam Vitals reviewed.  Constitutional:      General: She is not in acute distress.    Appearance: She is well-developed. She is morbidly obese. She is not diaphoretic.  HENT:     Head: Normocephalic and atraumatic.     Right Ear: External ear normal.     Left Ear: External ear normal.     Nose: Nose normal.  Eyes:     General: No scleral icterus.    Conjunctiva/sclera: Conjunctivae normal.  Neck:     Trachea: Phonation normal.  Cardiovascular:     Rate and Rhythm: Normal rate and regular rhythm.  Pulmonary:     Effort: Pulmonary effort is normal. No respiratory distress.     Breath sounds: No stridor.  Abdominal:     General: There is no distension.  Musculoskeletal:        General: Normal range of motion.     Cervical back: Normal range of motion.  Skin:      Neurological:     Mental Status: She is alert and oriented to person, place, and time.  Psychiatric:        Behavior: Behavior normal.     ED Results and Treatments Labs (all labs ordered are listed, but only abnormal results are displayed) Labs Reviewed  CBC WITH DIFFERENTIAL/PLATELET - Abnormal; Notable for the following components:  Result Value   WBC 19.9 (*)    RBC 3.81 (*)    Hemoglobin 8.8 (*)    HCT 31.8 (*)    MCH 23.1 (*)    MCHC 27.7 (*)    RDW 19.5 (*)    Neutro Abs 15.9 (*)    Eosinophils Absolute 0.6 (*)    Abs Immature Granulocytes 0.29 (*)    All other components within normal limits  URINALYSIS, ROUTINE W REFLEX MICROSCOPIC - Abnormal; Notable for the following components:   APPearance HAZY (*)    Hgb urine  dipstick SMALL (*)    Protein, ur 30 (*)    Nitrite POSITIVE (*)    Leukocytes,Ua LARGE (*)    Bacteria, UA MANY (*)    All other components within normal limits  COMPREHENSIVE METABOLIC PANEL - Abnormal; Notable for the following components:   Creatinine, Ser 1.55 (*)    Calcium 8.8 (*)    Albumin 3.1 (*)    GFR, Estimated 42 (*)    All other components within normal limits  BLOOD GAS, VENOUS - Abnormal; Notable for the following components:   pO2, Ven 56 (*)    All other components within normal limits  I-STAT CHEM 8, ED - Abnormal; Notable for the following components:   Creatinine, Ser 1.70 (*)    Hemoglobin 10.5 (*)    HCT 31.0 (*)    All other components within normal limits  URINE CULTURE  HCG, SERUM, QUALITATIVE  ETHANOL  CBG MONITORING, ED                                                                                                                         EKG  EKG Interpretation Date/Time:    Ventricular Rate:    PR Interval:    QRS Duration:    QT Interval:    QTC Calculation:   R Axis:      Text Interpretation:         Radiology No results found.  Medications Ordered in ED Medications  acetaminophen (TYLENOL) tablet 1,000 mg (has no administration in time range)    Or  acetaminophen (TYLENOL) suppository 650 mg (has no administration in time range)  prochlorperazine (COMPAZINE) injection 10 mg (has no administration in time range)  meropenem (MERREM) 1 g in sodium chloride 0.9 % 100 mL IVPB (has no administration in time range)   Procedures Procedures  (including critical care time) Medical Decision Making / ED Course   Medical Decision Making Amount and/or Complexity of Data Reviewed Labs: ordered. Decision-making details documented in ED Course.  Risk Decision regarding hospitalization.    Patient here with generalized fatigue Will assess for evidence of anemia, infection, electrolyte derangements, metabolic derangements.  CBC with  leukocytosis.  Anemia with a hemoglobin of 8.8, down from 9.7 one-year ago. CMP without significant electrolyte derangements.  Mild renal insufficiency without AKI. Normal sugar level. ECG obtained to assist in guiding care negative. In and out UA consistent with  a urinary tract infection. Given patient's allergies, she will require IV antibiotics. Started on aztreonam.  I was called by pharmacy who noted patient was recently in a [facility] facility and grew out multidrug-resistant bacteria.  Recommended meropenem.  Admitted to medicine for further management.    Final Clinical Impression(s) / ED Diagnoses Final diagnoses:  Pyelonephritis    This chart was dictated using voice recognition software.  Despite best efforts to proofread,  errors can occur which can change the documentation meaning.    Nira Conn, MD 10/31/22 [phone]

## 2022-10-31 NOTE — Progress Notes (Signed)
   10/31/22 2102  BiPAP/CPAP/SIPAP  BiPAP/CPAP/SIPAP Pt Type Adult  Reason BIPAP/CPAP not in use Non-compliant (Patient refused CPAP qhs.   Patient states that she is too claustropobic to tolerate wearing CPAP.)

## 2022-11-01 ENCOUNTER — Inpatient Hospital Stay (HOSPITAL_COMMUNITY): Payer: Medicaid Other

## 2022-11-01 DIAGNOSIS — N12 Tubulo-interstitial nephritis, not specified as acute or chronic: Secondary | ICD-10-CM | POA: Diagnosis not present

## 2022-11-01 NOTE — [place] FL2 (Addendum)
[place] MEDICAID FL2 LEVEL OF CARE FORM     IDENTIFICATION  Patient Name: Heather [name] Birthdate: [birth year] Sex: female Admission Date (Current Location): 10/30/2022  [place] and [place] Number:  Haynes Bast [id number] R Facility and Address:  [facility],  [address], [place] [address]      Provider Number: [id number]  Attending Physician Name and Address:  Jerald Kief, MD  Relative Name and Phone Number:  Annamaria Boots [phone]    Current Level of Care: Hospital Recommended Level of Care: Skilled Nursing Facility Prior Approval Number:    Date Approved/Denied:   PASRR Number: [id number]  Discharge Plan: SNF    Current Diagnoses: Patient Active Problem List   Diagnosis Date Noted   Pyelonephritis 10/31/2022   Pyelonephritis, acute 10/31/2022   Intertrigo 10/31/2022   Depression due to physical illness 02/19/2022   MDD (major depressive disorder), recurrent, in partial remission (HCC) 10/15/2021   Urinary retention 02/26/2021   Homelessness 02/07/2021   Cystitis 01/26/2021   Social problem 01/26/2021   Enterococcus faecalis infection 01/26/2021   Mood disorder (HCC) 01/26/2021   Chronic pain disorder 01/26/2021   MDD (major depressive disorder), recurrent severe, without psychosis (HCC) 11/06/2020   Thrombocytosis 10/31/2020   CKD (chronic kidney disease), stage III (HCC) 10/31/2020   Major depressive disorder, recurrent (HCC) 09/13/2020   Insomnia    Anemia of chronic disease    Chronic bilateral low back pain without sciatica    Debility 08/24/2020   SIRS (systemic inflammatory response syndrome) (HCC) 08/18/2020   Pressure injury of skin 08/11/2020   Rhabdomyolysis 08/10/2020   Acute renal failure (HCC) 08/10/2020   PTSD (post-traumatic stress disorder) 05/15/2020   Major depressive disorder, recurrent episode, moderate (HCC) 05/05/2020   Other specified anxiety disorders 05/05/2020   Blood in stool    Gastritis and  gastroduodenitis    Benign neoplasm of sigmoid colon    Class 3 severe obesity with serious comorbidity and body mass index (BMI) greater than or equal to 70 in adult (HCC) 06/16/2018   Nexplanon insertion 03/27/2017   Postpartum hypertension 03/05/2017   Perennial allergic rhinitis 11/05/2016   Mild persistent asthma with acute exacerbation 11/05/2016   Hypoglycemia 11/07/2015   OSA (obstructive sleep apnea) 11/07/2015   DM type 2 (diabetes mellitus, type 2) (HCC) 12/07/2014   Panniculitis 12/06/2014   Cellulitis, abdominal wall 11/11/2014   Abdominal pain 07/14/2014   Eosinophilic esophagitis 07/22/2013   Change in bowel habits 04/28/2013   RLS (restless legs syndrome) 08/08/2011   Adjustment disorder 08/06/2011   DYSMETABOLIC SYNDROME 05/13/2006   Essential hypertension 05/12/2006   Asthma 05/12/2006   OSTEOARTHRITIS 05/12/2006    Orientation RESPIRATION BLADDER Height & Weight     Self, Time, Situation, Place  Normal Continent Weight: (!) 238.6 kg Height:  5\' 3"  (160 cm)  BEHAVIORAL SYMPTOMS/MOOD NEUROLOGICAL BOWEL NUTRITION STATUS     (n/a) Continent Diet  AMBULATORY STATUS COMMUNICATION OF NEEDS Skin   Extensive Assist Verbally                         Personal Care Assistance Level of Assistance  Bathing, Feeding, Dressing, Total care Bathing Assistance: Maximum assistance Feeding assistance: Limited assistance Dressing Assistance: Maximum assistance     Functional Limitations Info  Sight, Hearing, Speech Sight Info: Impaired (eyeglasses) Hearing Info: Adequate Speech Info: Adequate    SPECIAL CARE FACTORS FREQUENCY  PT (By licensed PT), OT (By licensed OT)     PT Frequency:  3X/wk OT Frequency: 3X/wk            Contractures Contractures Info: Not present    Additional Factors Info  Code Status, Allergies, Psychotropic, Insulin Sliding Scale, Isolation Precautions, Suctioning Needs Code Status Info: Full Allergies Info: Bee Venom,  Ciprofloxacin, Keflex (Cephalexin), Penicillins, Vancomycin, Risperidone, Adhesive (Tape), Latex Psychotropic Info: see d/c summary Insulin Sliding Scale Info: see d/c summary Isolation Precautions Info: Hx- contact ESBL, MRSA Suctioning Needs: n/a   Current Medications (11/01/2022):  This is the current hospital active medication list Current Facility-Administered Medications  Medication Dose Route Frequency Provider Last Rate Last Admin   acetaminophen (TYLENOL) tablet 1,000 mg  1,000 mg Oral Q6H PRN Bobette Mo, MD   1,000 mg at 11/01/22 1055   Or   acetaminophen (TYLENOL) suppository 650 mg  650 mg Rectal Q6H PRN Bobette Mo, MD       albuterol (PROVENTIL) (2.5 MG/3ML) 0.083% nebulizer solution 2.5 mg  2.5 mg Nebulization Q4H PRN Foust, Katy L, NP       ARIPiprazole (ABILIFY) tablet 5 mg  5 mg Oral Daily Foust, Katy L, NP   5 mg at 11/01/22 1054   benzonatate (TESSALON) capsule 100 mg  100 mg Oral TID PRN Bishop Limbo L, NP       clonazepam (KLONOPIN) disintegrating tablet 0.25 mg  0.25 mg Oral BID Foust, Katy L, NP   0.25 mg at 11/01/22 1055   diclofenac Sodium (VOLTAREN) 1 % topical gel 4 g  4 g Topical TID PRN Foust, Katy L, NP       DULoxetine (CYMBALTA) DR capsule 90 mg  90 mg Oral Daily Foust, Katy L, NP   90 mg at 11/01/22 1055   enoxaparin (LOVENOX) injection 60 mg  60 mg Subcutaneous Q12H Jerald Kief, MD   60 mg at 10/31/22 2357   fluticasone (FLONASE) 50 MCG/ACT nasal spray 2 spray  2 spray Each Nare BID PRN Foust, Katy L, NP       furosemide (LASIX) tablet 40 mg  40 mg Oral Daily Foust, Katy L, NP   40 mg at 11/01/22 1055   gabapentin (NEURONTIN) capsule 100 mg  100 mg Oral QID Foust, Katy L, NP   100 mg at 11/01/22 1055   Gerhardt's butt cream   Topical BID Jerald Kief, MD   1 Application at 11/01/22 1057   ketoconazole (NIZORAL) 2 % cream   Topical BID Foust, Katy L, NP   Given at 11/01/22 1057   lidocaine (LIDODERM) 5 % 3 patch  3 patch Transdermal Q24H  Foust, Katy L, NP   3 patch at 10/31/22 1000   melatonin tablet 10 mg  10 mg Oral QHS Foust, Katy L, NP   10 mg at 10/31/22 2219   meropenem (MERREM) 1 g in sodium chloride 0.9 % 100 mL IVPB  1 g Intravenous Q8H Foust, Katy L, NP 200 mL/hr at 11/01/22 1102 1 g at 11/01/22 1102   mometasone-formoterol (DULERA) 100-5 MCG/ACT inhaler 2 puff  2 puff Inhalation BID Foust, Katy L, NP   2 puff at 11/01/22 0619   montelukast (SINGULAIR) tablet 10 mg  10 mg Oral QHS Foust, Katy L, NP   10 mg at 10/31/22 2219   pantoprazole (PROTONIX) EC tablet 40 mg  40 mg Oral Daily Foust, Katy L, NP   40 mg at 11/01/22 1055   polyethylene glycol (MIRALAX / GLYCOLAX) packet 17 g  17 g Oral Daily PRN Bishop Limbo  L, NP       prochlorperazine (COMPAZINE) injection 10 mg  10 mg Intravenous Q4H PRN Bobette Mo, MD       rOPINIRole (REQUIP XL) 24 hr tablet 4 mg  4 mg Oral QHS Foust, Katy L, NP   4 mg at 10/31/22 2219     Discharge Medications: Please see discharge summary for a list of discharge medications.  Relevant Imaging Results:  Relevant Lab Results:   Additional Information SS # [id number] Patient needs long term care.  Beckie Busing, RN

## 2022-11-01 NOTE — Progress Notes (Signed)
Chaplain attempted visit earlier today but Heather [name] was in therapy. A Chaplain will follow-up later today.     11/01/22 1200  Spiritual Encounters  Type of Visit Initial  Care provided to: Pt not available

## 2022-11-01 NOTE — Plan of Care (Signed)

## 2022-11-01 NOTE — TOC Progression Note (Signed)
Transition of Care [facility]) - Progression Note    Patient Details  Name: Heather [name] MRN: [id number] Date of Birth: 04-24-1975  [facility]) CM/SW Contact  Beckie Busing, RN Phone Number:[phone]  11/01/2022, 1:19 PM  Clinical Narrative:    Patient with SNF recommendation. FL2 completed and faxed out to facilities for bed offers. TOC will continue to follow.    Expected Discharge Plan: Skilled Nursing Facility Barriers to Discharge: Continued Medical Work up  Expected Discharge Plan and Services In-house Referral: NA Discharge Planning Services: CM Consult   Living arrangements for the past 2 months: Hotel/Motel                 DME Arranged: N/A DME Agency: NA       HH Arranged: NA HH Agency: NA         Social Determinants of Health (SDOH) Interventions SDOH Screenings   Food Insecurity: No Food Insecurity (10/31/2022)  Housing: Patient Declined (10/31/2022)  Transportation Needs: Unmet Transportation Needs (10/31/2022)  Utilities: Not At Risk (10/31/2022)  Depression (PHQ2-9): Medium Risk (04/22/2022)  Social Connections: Unknown (09/30/2022)   Received [facility]  Stress: No Stress Concern Present (10/01/2022)   Received from [facility], [facility]  Tobacco Use: Medium Risk (10/30/2022)    Readmission Risk Interventions    11/01/2022   11:56 AM 10/31/2022    2:42 PM 12/08/2020    1:37 PM  Readmission Risk Prevention Plan  Post Dischage Appt Complete    Medication Screening Complete    Transportation Screening Complete  Complete  PCP or Specialist Appt within 5-7 Days  Complete   Home Care Screening  Complete   Medication Review (RN CM)  Referral to Pharmacy   Medication Review (RN Care Manager)   Complete  PCP or Specialist appointment within 3-5 days of discharge   Complete  HRI or Home Care Consult   Complete  SW Recovery Care/Counseling Consult   Complete  Palliative Care Screening   Not Applicable   Skilled Nursing Facility   Not Applicable

## 2022-11-01 NOTE — Hospital Course (Signed)
Heather [name] is a 47 y.o. female with a history of right renal mass status post nephrectomy, depression, morbid obesity, CKD stage IIIb, COPD, schizoaffective disorder.  Patient presented secondary to secondary to increased weakness and difficulty ambulating.  Patient was found to have evidence of pyelonephritis with cultures significant for ESBL E. coli and Providencia stuartii and was treated with meropenem.

## 2022-11-01 NOTE — Progress Notes (Signed)
  Progress Note   Patient: Heather [name] ZOX:[id number] DOB: Jan 23, 1976 DOA: 10/30/2022     1 DOS: the patient was seen and examined on 11/01/2022   Brief hospital course: 46yo with hx super morbid obesity, R renal mass s/p nephrectomy, depression who presented with hx of increasing weakness and difficulty ambulating. Up until recently, pt had been a resident at LTC however elected to leave "against medical advice" to be closer with family. Pt has since been living out of a hotel room. Several days PTA, pt reported increased dysuria and was concerned about UTI, thus presented to ED. In ED, pt was found to have UA suggestive of UTI. Pt was started on empiric abx   Assessment and Plan:  UTI with Pyelonephritis -f/u on cultures, thus far pos for >100,000 gram neg rods -continue with empiric meropenem given past hx ESBL ecoli -leukocytosis improving to 13.6k. Pt reports feeling better today   CKD 3b -outpatient labs reviewed. 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 -seems stable   COPD -On minimal O2 support   Depression -seems stable  Schizoaffective disorder -Seems stable at this time   Subjective: Reports feeling somewhat better today  Physical Exam: Vitals:   10/31/22 1855 10/31/22 2211 11/01/22 0539 11/01/22 1322  BP:  118/68 (!) 113/59 121/65  Pulse:  92 92 82  Resp:  18 18 (!) 22  Temp:  98.4 F (36.9 C) 98.2 F (36.8 C) 98.2 F (36.8 C)  TempSrc:  Oral Oral Oral  SpO2: 92% 93% 92% 95%  Weight:      Height:       General exam: Awake, laying in bed, in nad Respiratory system: Normal respiratory effort, no wheezing Cardiovascular system: regular rate, s1, s2 Gastrointestinal system: Soft, nondistended, positive BS Central nervous system: CN2-12  grossly intact, strength intact Extremities: Perfused, no clubbing Skin: Normal skin turgor, no notable skin lesions seen Psychiatry: Mood normal // no visual hallucinations    Data Reviewed:  Labs reviewed: Na 136, K 4.1, Cr 1.55, WBC 13.6, Hgb 8.7, Plts 376  Family Communication: Pt in room, family not at bedside  Disposition: Status is: Inpatient Remains inpatient appropriate because: Severity of illness  Planned Discharge Destination: Skilled nursing facility     Author: Rickey Barbara, MD 11/01/2022 4:46 PM  For on call review [email or web address].

## 2022-11-01 NOTE — Evaluation (Signed)
Occupational Therapy Evaluation Patient Details Name: Heather [name] MRN: [id number] DOB: [birth year] Today's Date: 11/01/2022   History of Present Illness Heather [name] is a 47 y.o. female presents with several days of generalized fatigue, weakness, and difficulty walking. PMH: anxiety, arthritis, asthma, back pain, COPD, depression, diabetes, GERD, IBS, morbid obesity, neuromuscular disorder, schizoaffective disorder,   Clinical Impression   The pt is currently limited by the below listed deficits (see OT problem list), which compromises her overall ADL performance. At current, she requires increased assist for self-care tasks, such as lower body dressing, toileting, and bathing. During the session today, she required min assist with increased time and effort for supine to sit. She sat EOB for ~5 minutes with SBA, while performing simple grooming and ROM. She subsequently deferred attempts at sit to stand, and requested to return to supine.  She will benefit from further OT services to maximize her ADL performance and to decrease the risk for restricted participation in meaningful activities. Patient will benefit from continued inpatient follow up therapy, <3 hours/day.     Recommendations for follow up therapy are one component of a multi-disciplinary discharge planning process, led by the attending physician.  Recommendations may be updated based on patient status, additional functional criteria and insurance authorization.   Assistance Recommended at Discharge Frequent or constant Supervision/Assistance  Patient can return home with the following A lot of help with bathing/dressing/bathroom;Assistance with cooking/housework;A lot of help with walking and/or transfers    Functional Status Assessment  Patient has had a recent decline in their functional status and demonstrates the ability to make significant improvements in function in a reasonable and predictable amount of time.   Equipment Recommendations  Other (comment) (defer to next level of care)    Recommendations for Other Services       Precautions / Restrictions Precautions Precautions: Fall Restrictions Weight Bearing Restrictions: No      Mobility Bed Mobility Overal bed mobility: Needs Assistance Bed Mobility: Supine to Sit, Sit to Supine     Supine to sit: Min assist, HOB elevated Sit to supine: Mod assist   General bed mobility comments: Pt required moderately increased time and effort for bed mobility, as well as use of the bedrail for support. HOB elevated. Needed assist for BLE in order to achieve sit to supine. She sat EOB with SBA for ~5 minute, before requesting to return to supine    Transfers      General transfer comment: unable to assess      Balance       Sitting balance - Comments: SBA for ~5 minutes seated EOB           ADL either performed or assessed with clinical judgement   ADL Overall ADL's : Needs assistance/impaired Eating/Feeding: Set up;Sitting Eating/Feeding Details (indicate cue type and reason): In sitting, based on clinical judgement Grooming: Set up;Sitting Grooming Details (indicate cue type and reason): She performed face washing seated EOB. Upper Body Bathing: Minimal assistance Upper Body Bathing Details (indicate cue type and reason): based on clinical judgement Lower Body Bathing: Maximal assistance Lower Body Bathing Details (indicate cue type and reason): based on clinical judgement Upper Body Dressing : Minimal assistance Upper Body Dressing Details (indicate cue type and reason): simulated seated EOB Lower Body Dressing: Total assistance       Toileting- Clothing Manipulation and Hygiene: Total assistance;Bed level  Pertinent Vitals/Pain Pain Assessment Pain Assessment: No/denies pain     Hand Dominance Left   Extremity/Trunk Assessment Upper Extremity Assessment Upper Extremity  Assessment: Overall WFL for tasks assessed   Lower Extremity Assessment Lower Extremity Assessment: Generalized weakness. She reports chronic partial numbness in her toes.        Communication Communication Communication: No difficulties   Cognition Arousal/Alertness: Awake/alert Behavior During Therapy: WFL for tasks assessed/performed Overall Cognitive Status: Within Functional Limits for tasks assessed              General Comments: Oriented x4, able to follow 1-2 step commands without difficulty.                Home Living Family/patient expects to be discharged to:: Other (Comment) Unsure           Additional Comments: Pt reports living in motel since d/c from SNF on October 18, 2022. Pt reports she has a RW and she was able to ambulate short distances with it after leaving the SNF.      Prior Functioning/Environment Prior Level of Function : Needs assist             Mobility Comments: She reports using RW since discharge from SNF, ambulating from bed or couch to restroom with RW in motel. ADLs Comments: She reported a friend has been assisting her with bathing, however he is unable to help her everyday. She has been wearing gowns for clothing, and no underwear/bottoms due to difficulty performing lower body dressing. She orders out for meals or warms food in the microwave.        OT Problem List: Decreased strength;Decreased activity tolerance;Impaired balance (sitting and/or standing);Cardiopulmonary status limiting activity;Impaired sensation;Obesity      OT Treatment/Interventions: Self-care/ADL training;Therapeutic exercise;Energy conservation;DME and/or AE instruction;Therapeutic activities;Patient/family education;Balance training    OT Goals(Current goals can be found in the care plan section) Acute Rehab OT Goals Patient Stated Goal: to be able to walk better OT Goal Formulation: With patient Time For Goal Achievement: 11/15/22 Potential to Achieve  Goals: Good ADL Goals Pt Will Perform Grooming: with set-up;sitting Pt Will Perform Upper Body Dressing: with set-up;sitting Pt Will Perform Lower Body Dressing: with min assist;with adaptive equipment;sitting/lateral leans Pt Will Transfer to Toilet: with min guard assist;stand pivot transfer;bedside commode Pt Will Perform Toileting - Clothing Manipulation and hygiene: with min guard assist;sit to/from stand  OT Frequency: Min 1X/week       AM-PAC OT "6 Clicks" Daily Activity     Outcome Measure Help from another person eating meals?: None Help from another person taking care of personal grooming?: A Little Help from another person toileting, which includes using toliet, bedpan, or urinal?: Total Help from another person bathing (including washing, rinsing, drying)?: A Lot Help from another person to put on and taking off regular upper body clothing?: A Little Help from another person to put on and taking off regular lower body clothing?: Total 6 Click Score: 14   End of Session Equipment Utilized During Treatment: Other (comment) (N/A) Nurse Communication: Mobility status (nurse tech informed)  Activity Tolerance: Other (comment) (Fair+ overall tolerance) Patient left: in bed;with call bell/phone within reach  OT Visit Diagnosis: Muscle weakness (generalized) (M62.81);Other abnormalities of gait and mobility (R26.89)                Time: 1000-1025 OT Time Calculation (min): 25 min Charges:  OT General Charges $OT Visit: 1 Visit OT Evaluation $OT Eval Moderate Complexity: 1 Mod OT  Treatments $Therapeutic Activity: 8-22 mins    Reuben Likes, OTR/L 11/01/2022, 12:48 PM

## 2022-11-02 DIAGNOSIS — N12 Tubulo-interstitial nephritis, not specified as acute or chronic: Secondary | ICD-10-CM | POA: Diagnosis not present

## 2022-11-02 NOTE — Progress Notes (Signed)
   11/02/22 2200  BiPAP/CPAP/SIPAP  $ Non-Invasive Home Ventilator  Initial  $ Face Mask Medium Yes  BiPAP/CPAP/SIPAP Pt Type Adult  BiPAP/CPAP/SIPAP Resmed  Mask Type Full face mask  Mask Size Medium  Respiratory Rate 20 breaths/min  FiO2 (%) 21 %  Patient Home Equipment No  Auto Titrate Yes (5-20 cmH2O)

## 2022-11-02 NOTE — Plan of Care (Signed)

## 2022-11-02 NOTE — Progress Notes (Signed)
   11/01/22 2249  BiPAP/CPAP/SIPAP  BiPAP/CPAP/SIPAP Pt Type Adult  Reason BIPAP/CPAP not in use Other(comment) (does not want to wear tonight)

## 2022-11-02 NOTE — Progress Notes (Signed)
  Progress Note   Patient: Heather [name] ZOX:[id number] DOB: Jan 11, 1976 DOA: 10/30/2022     2 DOS: the patient was seen and examined on 11/02/2022   Brief hospital course: 46yo with hx super morbid obesity, R renal mass s/p nephrectomy, depression who presented with hx of increasing weakness and difficulty ambulating. Up until recently, pt had been a resident at LTC however elected to leave "against medical advice" to be closer with family. Pt has since been living out of a hotel room. Several days PTA, pt reported increased dysuria and was concerned about UTI, thus presented to ED. In ED, pt was found to have UA suggestive of UTI. Pt was started on empiric abx   Assessment and Plan:  UTI with Pyelonephritis -f/u on cultures, thus far pos for >100,000 Providencia stuartii with 50,000 Ecoli, awaiting sensitivities -Pt had been continued on meropenem given past hx ESBL ecoli -WBC improved to 12.3   CKD 3b -outpatient labs reviewed. 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 -seems stable   COPD -On minimal O2 support   Depression -seems stable  Schizoaffective disorder -Seems stable at this time  Possible falls -Large bruising over R knee observed on exam -Pt is unsure if she fell prior to admit or not -Ordered and reviewed xray, evidence of osteoarthritis without fracture   Subjective: Without complaints today  Physical Exam: Vitals:   11/01/22 2034 11/02/22 0426 11/02/22 0855 11/02/22 1349  BP: (!) 129/47 124/60  123/64  Pulse: 89 87  91  Resp: 16 18  18   Temp: 98.4 F (36.9 C) 98.6 F (37 C)  98.3 F (36.8 C)  TempSrc: Oral Oral    SpO2: 93% 90% 92% 92%  Weight:      Height:       General exam: Conversant, in no acute distress Respiratory system:  normal chest rise, clear, no audible wheezing Cardiovascular system: regular rhythm, s1-s2 Gastrointestinal system: Nondistended, nontender, pos BS Central nervous system: No seizures, no tremors Extremities: No cyanosis, no joint deformities Skin: No rashes, no pallor Psychiatry: Affect normal // no auditory hallucinations   Data Reviewed:  Labs reviewed: Na 136, K 4.4, Cr 1.74, WBC 12.3, Hgb 8.4  Family Communication: Pt in room, family not at bedside  Disposition: Status is: Inpatient Remains inpatient appropriate because: Severity of illness  Planned Discharge Destination: Skilled nursing facility     Author: Rickey Barbara, MD 11/02/2022 5:28 PM  For on call review [email or web address].

## 2022-11-03 DIAGNOSIS — N12 Tubulo-interstitial nephritis, not specified as acute or chronic: Secondary | ICD-10-CM | POA: Diagnosis not present

## 2022-11-03 LAB — COMPREHENSIVE METABOLIC PANEL WITH GFR
ALT: 13 U/L (ref 0–44)
AST: 12 U/L — ABNORMAL LOW (ref 15–41)
Albumin: 2.8 g/dL — ABNORMAL LOW (ref 3.5–5.0)
Alkaline Phosphatase: 62 U/L (ref 38–126)
Anion gap: 9 (ref 5–15)
BUN: 25 mg/dL — ABNORMAL HIGH (ref 6–20)
CO2: 26 mmol/L (ref 22–32)
Calcium: 8.4 mg/dL — ABNORMAL LOW (ref 8.9–10.3)
Chloride: 102 mmol/L (ref 98–111)
Creatinine, Ser: 1.68 mg/dL — ABNORMAL HIGH (ref 0.44–1.00)
GFR, Estimated: 38 mL/min — ABNORMAL LOW (ref >60)
Glucose, Bld: 93 mg/dL (ref 70–99)
Potassium: 4.2 mmol/L (ref 3.5–5.1)
Sodium: 137 mmol/L (ref 135–145)
Total Bilirubin: 0.3 mg/dL (ref 0.3–1.2)
Total Protein: 7.2 g/dL (ref 6.5–8.1)

## 2022-11-03 LAB — CBC
HCT: 32.9 % — ABNORMAL LOW (ref 36.0–46.0)
Hemoglobin: 8.8 g/dL — ABNORMAL LOW (ref 12.0–15.0)
MCH: 23.3 pg — ABNORMAL LOW (ref 26.0–34.0)
MCHC: 26.7 g/dL — ABNORMAL LOW (ref 30.0–36.0)
MCV: 87 fL (ref 80.0–100.0)
Platelets: 387 10*3/uL (ref 150–400)
RBC: 3.78 MIL/uL — ABNORMAL LOW (ref 3.87–5.11)
RDW: 19.4 % — ABNORMAL HIGH (ref 11.5–15.5)
WBC: 11.6 10*3/uL — ABNORMAL HIGH (ref 4.0–10.5)
nRBC: 0 % (ref 0.0–0.2)

## 2022-11-03 NOTE — Progress Notes (Signed)
  Progress Note   Patient: Heather [name] RUE:[id number] DOB: [birth year] DOA: 10/30/2022     3 DOS: the patient was seen and examined on 11/03/2022   Brief hospital course: 47yo with hx super morbid obesity, R renal mass s/p nephrectomy, depression who presented with hx of increasing weakness and difficulty ambulating. Up until recently, pt had been a resident at [facility]TC however elected to leave "against medical advice" to be closer with family. Pt has since been living out of a hotel room. Several days PTA, pt reported increased dysuria and was concerned about UTI, thus presented to ED. In ED, pt was found to have UA suggestive of UTI. Pt was started on empiric abx   Assessment and Plan:  UTI with Pyelonephritis -f/u on cultures, thus far pos for >100,000 Providencia stuartii with 50,000 Ecoli, awaiting sensitivities -Pt had been continued on meropenem given past hx ESBL ecoli -WBC improved to 11.6   CKD 3b -outpatient labs reviewed. 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 TOC following   Subjective: Feeling generally weak today  Physical Exam: Vitals:   11/02/22 2047 11/02/22 2100 11/03/22 0504 11/03/22 1251  BP: 119/67  (!) 140/71 (!) 115/50  Pulse: 95  85 90  Resp: 16  16 16   Temp: 99 F (37.2 C)  98.1 F (36.7 C) 98.3 F (36.8 C)  TempSrc: Oral  Oral Oral  SpO2: 94% 91% 95% 95%  Weight:      Height:       General exam: Awake,  laying in bed, in nad Respiratory system: Normal respiratory effort, no wheezing Cardiovascular system: regular rate, s1, s2 Gastrointestinal system: Soft, nondistended, positive BS Central nervous system: CN2-12 grossly intact, strength intact Extremities: Perfused, no clubbing Skin: Normal skin turgor, no notable skin lesions seen Psychiatry: Mood normal // no visual hallucinations   Data Reviewed:  Labs reviewed: Na 137, K 4.2, Cr 1.68, WBC 11.6  Family Communication: Pt in room, family not at bedside  Disposition: Status is: Inpatient Remains inpatient appropriate because: Severity of illness  Planned Discharge Destination: Skilled nursing facility     Author: Rickey Barbara, MD 11/03/2022 5:49 PM  For on call review [email or web address].

## 2022-11-03 NOTE — Progress Notes (Signed)
   11/03/22 2302  BiPAP/CPAP/SIPAP  $ Non-Invasive Home Ventilator  Subsequent  BiPAP/CPAP/SIPAP Pt Type Adult  BiPAP/CPAP/SIPAP Resmed  Mask Type Nasal mask  Mask Size Medium  Respiratory Rate 18 breaths/min  FiO2 (%) 21 %  Patient Home Equipment No  Auto Titrate Yes

## 2022-11-04 DIAGNOSIS — N12 Tubulo-interstitial nephritis, not specified as acute or chronic: Secondary | ICD-10-CM | POA: Diagnosis not present

## 2022-11-04 NOTE — Progress Notes (Signed)
   11/04/22 2236  BiPAP/CPAP/SIPAP  BiPAP/CPAP/SIPAP Pt Type Adult  BiPAP/CPAP/SIPAP Resmed  Mask Type Nasal mask  Mask Size Medium  Respiratory Rate 18 breaths/min  FiO2 (%) 21 %  Patient Home Equipment No  Auto Titrate Yes (5-20)

## 2022-11-04 NOTE — Progress Notes (Signed)
  Progress Note   Patient: Heather [name] ZOX:[id number] DOB: 1975-09-01 DOA: 10/30/2022     4 DOS: the patient was seen and examined on 11/04/2022   Brief hospital course: 46yo with hx super morbid obesity, R renal mass s/p nephrectomy, depression who presented with hx of increasing weakness and difficulty ambulating. Up until recently, pt had been a resident at LTC however elected to leave "against medical advice" to be closer with family. Pt has since been living out of a hotel room. Several days PTA, pt reported increased dysuria and was concerned about UTI, thus presented to ED. In ED, pt was found to have UA suggestive of UTI. Pt was started on empiric abx   Assessment and Plan:  UTI with Pyelonephritis -f/u on cultures, thus far pos for >100,000 Providencia stuartii with 50,000 Ecoli, awaiting sensitivities -Pt had been continued on meropenem given past hx ESBL ecoli -WBC improved to 11.2   CKD 3b -outpatient labs reviewed. 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 TOC following   Subjective: Without complaints this AM  Physical Exam: Vitals:   11/03/22 2024 11/03/22 2218 11/04/22 0520 11/04/22 1349  BP:  115/65 130/70 (!) 140/68  Pulse:  84 84 92  Resp:  14 15 (!) 24  Temp:  98.6 F (37 C) 98.1 F (36.7 C) 98.7 F (37.1 C)  TempSrc:  Oral Oral Oral  SpO2: 94% 94% 91% 99%  Weight:      Height:       General exam: Conversant, in  no acute distress Respiratory system: normal chest rise, clear, no audible wheezing Cardiovascular system: regular rhythm, s1-s2 Gastrointestinal system: Nondistended, nontender, pos BS Central nervous system: No seizures, no tremors Extremities: No cyanosis, no joint deformities Skin: No rashes, no pallor Psychiatry: Affect normal // no auditory hallucinations   Data Reviewed:  Labs reviewed: Na 135, K 4.3, Cr 1.53, WBC 11.2, Hgb 8.8  Family Communication: Pt in room, family not at bedside  Disposition: Status is: Inpatient Remains inpatient appropriate because: Severity of illness  Planned Discharge Destination: Skilled nursing facility     Author: Rickey Barbara, MD 11/04/2022 6:27 PM  For on call review [email or web address].

## 2022-11-04 NOTE — Progress Notes (Signed)
Chaplain worked to follow-up to honor consult for an Scientist, water quality, Healthcare POA. Heather [name] was able to tell Chaplain that she already has correct paperwork in place. Chaplain does see that up to date paperwork is in Heather [name]'s chart. Chaplain worked to then provide support. Heather [name] wanted prayer. Chaplain offered prayer over her as she has been thinking about her living situation and where she will go. Heather [name] desires to be [name] where her children are located.     11/04/22 1100  Spiritual Encounters  Type of Visit Initial  Care provided to: Patient  Reason for visit Advance directives

## 2022-11-04 NOTE — TOC Progression Note (Signed)
Transition of Care [facility]) - Progression Note    Patient Details  Name: Heather [name] MRN: [id number] Date of Birth: 1975-07-19  Transition of [facility] CM/SW Contact  Beckie Busing, RN Phone Number:[phone]  11/04/2022, 11:59 AM  Clinical Narrative:    TOC continues to follow for SNF placement. Currently there are no bed offers. Patient has been declined by 25 facilities. TOC will continue to follow for bed search.   Expected Discharge Plan: Skilled Nursing Facility Barriers to Discharge: Continued Medical Work up  Expected Discharge Plan and Services In-house Referral: NA Discharge Planning Services: CM Consult   Living arrangements for the past 2 months: Hotel/Motel                 DME Arranged: N/A DME Agency: NA       HH Arranged: NA HH Agency: NA         Social Determinants of Health (SDOH) Interventions SDOH Screenings   Food Insecurity: No Food Insecurity (10/31/2022)  Housing: Patient Declined (10/31/2022)  Transportation Needs: Unmet Transportation Needs (10/31/2022)  Utilities: Not At Risk (10/31/2022)  Depression (PHQ2-9): Medium Risk (04/22/2022)  Social Connections: Unknown (09/30/2022)   Received from [facility]  Stress: No Stress Concern Present (10/01/2022)   Received from [facility], [facility]  Tobacco Use: Medium Risk (10/30/2022)    Readmission Risk Interventions    11/01/2022   11:56 AM 10/31/2022    2:42 PM 12/08/2020    1:37 PM  Readmission Risk Prevention Plan  Post Dischage Appt Complete    Medication Screening Complete    Transportation Screening Complete  Complete  PCP or Specialist Appt within 5-7 Days  Complete   Home Care Screening  Complete   Medication Review (RN CM)  Referral to Pharmacy   Medication Review (RN Care Manager)   Complete  PCP or Specialist appointment within 3-5 days of discharge   Complete  HRI or Home Care Consult   Complete  SW Recovery Care/Counseling Consult   Complete   Palliative Care Screening   Not Applicable  Skilled Nursing Facility   Not Applicable

## 2022-11-04 NOTE — Progress Notes (Signed)
Physical Therapy Treatment Patient Details Name: Heather [name] MRN: [id number] DOB: June 04, 1975 Today's Date: 11/04/2022   History of Present Illness Heather [name] is a 47 y.o. female presents with several days of generalized fatigue, weakness, and difficulty walking. PMH: anxiety, arthritis, asthma, back pain, COPD, depression, diabetes, GERD, IBS, morbid obesity, neuromuscular disorder, schizoaffective disorder,    PT Comments  The patient tolerated  mobilizing to bed edge and sat x 5 minutes and requested return to supine. Unable to safely attempt standing .   If plan is discharge home, recommend the following: A lot of help with bathing/dressing/bathroom;Assistance with cooking/housework;Assist for transportation;Help with stairs or ramp for entrance;Two people to help with walking and/or transfers   Can travel by private vehicle     No  Equipment Recommendations  None recommended by PT    Recommendations for Other Services       Precautions / Restrictions Precautions Precautions: Fall Restrictions Weight Bearing Restrictions: No     Mobility  Bed Mobility   Bed Mobility: Rolling     Supine to sit: Min assist, HOB elevated Sit to supine: Max assist   General bed mobility comments: Pt required moderately increased time and effort for bed mobility, as well as use of the bedrail for suppor. HOB elevated. Needed assist for BLE in order to achieve sit to supine. She sat EOB with SBA for ~5 minute, before requesting to return to supine. Max assist to get legs onto bed, rolls to each side using rail and  slings each leg forward to facilitate roll    Transfers                   General transfer comment: unable to assess    Ambulation/Gait                   Stairs             Wheelchair Mobility     Tilt Bed    Modified Rankin (Stroke Patients Only)       Balance Overall balance assessment: Needs assistance Sitting-balance  support: Feet supported Sitting balance-Leahy Scale: Fair Sitting balance - Comments: SBA for ~5 minutes seated EOB                                    Cognition Arousal/Alertness: Awake/alert Behavior During Therapy: WFL for tasks assessed/performed Overall Cognitive Status: Within Functional Limits for tasks assessed                                          Exercises      General Comments        Pertinent Vitals/Pain Pain Assessment Pain Assessment: No/denies pain    Home Living                          Prior Function            PT Goals (current goals can now be found in the care plan section) Progress towards PT goals: Progressing toward goals    Frequency           PT Plan Current plan remains appropriate    Co-evaluation              AM-PAC PT "6 Clicks"  Mobility   Outcome Measure  Help needed turning from your back to your side while in a flat bed without using bedrails?: A Little Help needed moving from lying on your back to sitting on the side of a flat bed without using bedrails?: A Little         6 Click Score: 6    End of Session   Activity Tolerance: Patient tolerated treatment well;Patient limited by fatigue Patient left: in bed;with call bell/phone within reach Nurse Communication: Mobility status PT Visit Diagnosis: Other abnormalities of gait and mobility (R26.89);Muscle weakness (generalized) (M62.81)     Time: 1610-9604 PT Time Calculation (min) (ACUTE ONLY): 19 min  Charges:    $Therapeutic Activity: 8-22 mins PT General Charges $$ ACUTE PT VISIT: 1 Visit                     Blanchard Kelch PT Acute Rehabilitation Services Office [phone] Weekend [phone]    Rada Hay 11/04/2022, 3:47 PM

## 2022-11-04 NOTE — Plan of Care (Signed)

## 2022-11-05 DIAGNOSIS — N12 Tubulo-interstitial nephritis, not specified as acute or chronic: Secondary | ICD-10-CM | POA: Diagnosis not present

## 2022-11-05 NOTE — Progress Notes (Signed)
  Progress Note   Patient: Heather [name] GEX:[id number] DOB: 06/29/1975 DOA: 10/30/2022     5 DOS: the patient was seen and examined on 11/05/2022   Brief hospital course: 46yo with hx super morbid obesity, R renal mass s/p nephrectomy, depression who presented with hx of increasing weakness and difficulty ambulating. Up until recently, pt had been a resident at LTC however elected to leave "against medical advice" to be closer with family. Pt has since been living out of a hotel room. Several days PTA, pt reported increased dysuria and was concerned about UTI, thus presented to ED. In ED, pt was found to have UA suggestive of UTI. Pt was started on empiric abx   Assessment and Plan:  UTI with Pyelonephritis -Urine cx pos for >100,000 Providencia stuartii with 50,000 ESBL Ecoli, both sensitive to meropenem -WBC improved to 11.2 -complete course of meropenem today to complete 5 days   CKD 3b -outpatient labs reviewed. 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 TOC continues to follow   Subjective: No complaints this AM  Physical Exam: Vitals:   11/04/22 2013 11/05/22 0510 11/05/22 0857 11/05/22 1251  BP: 128/64 125/66  127/60  Pulse: 90 81  91  Resp: 16 17  16   Temp: 98.7 F (37.1 C) 98.4 F (36.9 C)  98.6 F (37 C)  TempSrc: Oral Oral  Oral  SpO2: 94% 92% 95% 93%  Weight:      Height:       General exam: Awake, laying in bed, in  nad Respiratory system: Normal respiratory effort, no wheezing Cardiovascular system: regular rate, s1, s2 Gastrointestinal system: Soft, nondistended, positive BS Central nervous system: CN2-12 grossly intact, strength intact Extremities: Perfused, no clubbing Skin: Normal skin turgor, no notable skin lesions seen Psychiatry: Mood normal // no visual hallucinations   Data Reviewed:  Labs reviewed: Na 133, K 4.1, Cr 1.58  Family Communication: Pt in room, family not at bedside  Disposition: Status is: Inpatient Remains inpatient appropriate because: Severity of illness  Planned Discharge Destination: Skilled nursing facility     Author: Rickey Barbara, MD 11/05/2022 4:18 PM  For on call review [email or web address].

## 2022-11-05 NOTE — Progress Notes (Signed)
Occupational Therapy Treatment Patient Details Name: Heather [name] MRN: [id number] DOB: Sep 10, 1975 Today's Date: 11/05/2022   History of present illness Heather [name] is a 47 y.o. female presents with several days of generalized fatigue, weakness, and difficulty walking. PMH: anxiety, arthritis, asthma, back pain, COPD, depression, diabetes, GERD, IBS, morbid obesity, neuromuscular disorder, schizoaffective disorder,   OT comments  Pt progressed to sitting edge of bed for 10 minutes with stand-by assist; no loss of balance noted. She required total assist to donn her tennis shoes seated EOB. She stood from the edge of the bed using a bariatric RW, and subsequently took 2 lateral steps towards the head of the bed; min assist x2 required. She expressed a desire to progress her functional mobility, with a reported goal of being able to walk more and better. Continue OT plan of care to maximize her ADL performance & to decrease the risk for restricted participation in meaningful activities. Patient will benefit from continued inpatient follow up therapy, <3 hours/day.    Recommendations for follow up therapy are one component of a multi-disciplinary discharge planning process, led by the attending physician.  Recommendations may be updated based on patient status, additional functional criteria and insurance authorization.    Assistance Recommended at Discharge Frequent or constant Supervision/Assistance  Patient can return home with the following  A lot of help with bathing/dressing/bathroom;Assistance with cooking/housework;A lot of help with walking and/or transfers   Equipment Recommendations  Other (comment) (defer to next level of care)    Recommendations for Other Services      Precautions / Restrictions Precautions Precautions: Fall Restrictions Weight Bearing Restrictions: No       Mobility Bed Mobility Overal bed mobility: Needs Assistance Bed Mobility: Rolling,  Supine to Sit, Sit to Supine Rolling: Mod assist; she rolled left and right   Supine to sit: Min assist, HOB elevated Sit to supine: Mod assist; required assist for BLE   General bed mobility comments: She required moderately increased time and effort for supine to sit. She sat edge of bed for ~10 minutes with stand-by assist; no loss of balance noted. She required use of bed rail for performing bed mobility.    Transfers Overall transfer level: Needs assistance Equipment used:  (bariatric rolling walker) Transfers: Sit to/from Stand Sit to Stand: Min assist, +2 safety/equipment, From elevated surface           General transfer comment: She stood from the edge of the bed and subsequently took 2 lateral steps towards the head of the bed using a RW; she required min assist and verbal cues for walker advancement         ADL either performed or assessed with clinical judgement   ADL Overall ADL's : Needs assistance/impaired Eating/Feeding: Set up;Sitting Eating/Feeding Details (indicate cue type and reason): In sitting, based on clinical judgement Grooming: Set up;Sitting Grooming Details (indicate cue type and reason): Seated edge of bed; simulated Upper Body Bathing: Minimal assistance Upper Body Bathing Details (indicate cue type and reason): based on clinical judgement Lower Body Bathing: Maximal assistance Lower Body Bathing Details (indicate cue type and reason): based on clinical judgement Upper Body Dressing : Minimal assistance Upper Body Dressing Details (indicate cue type and reason): simulated seated EOB Lower Body Dressing: Total assistance Lower Body Dressing Details (indicate cue type and reason): She required total assist to donn her tennis shoes seated EOB.  Cognition Arousal/Alertness: Awake/alert Behavior During Therapy: WFL for tasks assessed/performed Overall Cognitive Status: Within Functional Limits for tasks assessed           General Comments: Oriented x4, able to follow 1-2 step commands without difficulty.                   Pertinent Vitals/ Pain       Pain Assessment Pain Assessment: No/denies pain         Frequency  Min 1X/week        Progress Toward Goals  OT Goals(current goals can now be found in the care plan section)  Progress towards OT goals: Progressing toward goals  Acute Rehab OT Goals Patient Stated Goal: to be able to walk better OT Goal Formulation: With patient Time For Goal Achievement: 11/15/22 Potential to Achieve Goals: Good  Plan Discharge plan remains appropriate       AM-PAC OT "6 Clicks" Daily Activity     Outcome Measure   Help from another person eating meals?: None Help from another person taking care of personal grooming?: A Little Help from another person toileting, which includes using toliet, bedpan, or urinal?: A Lot Help from another person bathing (including washing, rinsing, drying)?: A Lot Help from another person to put on and taking off regular upper body clothing?: A Little Help from another person to put on and taking off regular lower body clothing?: Total 6 Click Score: 15    End of Session Equipment Utilized During Treatment: Gait belt;Rolling walker (2 wheels)  OT Visit Diagnosis: Muscle weakness (generalized) (M62.81);Other abnormalities of gait and mobility (R26.89);Unsteadiness on feet (R26.81)   Activity Tolerance Patient tolerated treatment well (Fair+ overall tolerance; required intermittent rest breaks)   Patient Left in bed;with call bell/phone within reach   Nurse Communication Mobility status        Time: 7829-5621 OT Time Calculation (min): 26 min  Charges: OT General Charges $OT Visit: 1 Visit OT Treatments $Therapeutic Activity: 23-37 mins      Reuben Likes, OTR/L 11/05/2022, 1:11 PM

## 2022-11-05 NOTE — Plan of Care (Signed)
Pt refused q2h turns and bed assistance with auto turning. Education provided Problem: Education: Goal: Knowledge of General Education information will improve Description: Including pain rating scale, medication(s)/side effects and non-pharmacologic comfort measures Outcome: Progressing   Problem: Health Behavior/Discharge Planning: Goal: Ability to manage health-related needs will improve Outcome: Progressing   Problem: Clinical Measurements: Goal: Ability to maintain clinical measurements within normal limits will improve Outcome: Progressing Goal: Will remain free from infection Outcome: Progressing   Problem: Activity: Goal: Risk for activity intolerance will decrease Outcome: Progressing   Problem: Coping: Goal: Level of anxiety will decrease Outcome: Progressing   Problem: Pain Managment: Goal: General experience of comfort will improve Outcome: Progressing   Problem: Safety: Goal: Ability to remain free from injury will improve Outcome: Progressing

## 2022-11-06 DIAGNOSIS — N12 Tubulo-interstitial nephritis, not specified as acute or chronic: Secondary | ICD-10-CM | POA: Diagnosis not present

## 2022-11-06 NOTE — Plan of Care (Signed)

## 2022-11-06 NOTE — Progress Notes (Signed)
   11/06/22 2305  BiPAP/CPAP/SIPAP  Reason BIPAP/CPAP not in use Other(comment) (PT cant tollerate)

## 2022-11-06 NOTE — Progress Notes (Signed)
   11/05/22 2345  BiPAP/CPAP/SIPAP  BiPAP/CPAP/SIPAP Pt Type Adult  BiPAP/CPAP/SIPAP Resmed  Reason BIPAP/CPAP not in use Other(comment) (Pt. declined to use this evening, aware to notify.)

## 2022-11-06 NOTE — Progress Notes (Signed)
PROGRESS NOTE    Heather [name]  AVW:[id number] DOB: 08/01/1975 DOA: 10/30/2022 PCP: Renaye Rakers, MD   Brief Narrative: [phone] with hx super morbid obesity, R renal mass s/p nephrectomy, depression who presented with hx of increasing weakness and difficulty ambulating. Up until recently, pt had been a resident at LTC however elected to leave "against medical advice" to be closer with family. Pt has since been living out of a hotel room. Several days PTA, pt reported increased dysuria and was concerned about UTI, thus presented to ED. In ED, pt was found to have UA suggestive of UTI. Pt was started on empiric abx    Assessment and Plan:  Pyelonephritis Patient with associated dysuria on admission.  Elevated temperature without true fever in addition to leukocytosis also noted on admission.  Urinalysis was suggestive of UTI.  Patient started empirically on meropenem secondary to history of ESBL infection.  Urine culture obtained and was significant for Providencia stuartii in addition to ESBL E. coli.  Patient completed 5-day course of antibiotics.  CKD stage IIIb Stable.  COPD No evidence of exacerbation at this time. -Continue Dulera, Singulair -Continue albuterol as needed  Depression Schizoaffective disorder -Continue Cymbalta, Abilify   DVT prophylaxis: Lovenox Code Status:   Code Status: Full Code Family Communication: None at bedside Disposition Plan: Discharge to SNF once bed is available   Consultants:  None  Procedures:  None  Antimicrobials: Meropenem   Subjective: Patient reports no issues this morning.  Objective: BP (!) 116/52 (BP Location: Right Arm)   Pulse 100   Temp 98.5 F (36.9 C)   Resp 18   Ht 5\' 3"  (1.6 m)   Wt (!) 238.6 kg   SpO2 92%   BMI 93.18 kg/m   Examination:  General exam: Appears calm and comfortable. Morbidly obese. Respiratory system: Respiratory effort normal. Cardiovascular system: S1 & S2 heard,  RRR. Gastrointestinal system: Abdomen is nondistended, soft and nontender.  Normal bowel sounds heard. Central nervous system: Alert and oriented. No focal neurological deficits. Psychiatry: Judgement and insight appear normal. Mood & affect appropriate.    Data Reviewed: I have personally reviewed following labs and imaging studies  CBC Lab Results  Component Value Date   WBC 10.5 11/06/2022   RBC 4.10 11/06/2022   HGB 9.4 (L) 11/06/2022   HCT 34.4 (L) 11/06/2022   MCV 83.9 11/06/2022   MCH 22.9 (L) 11/06/2022   PLT 445 (H) 11/06/2022   MCHC 27.3 (L) 11/06/2022   RDW 19.5 (H) 11/06/2022   LYMPHSABS 2.3 10/31/2022   MONOABS 0.8 10/31/2022   EOSABS 0.6 (H) 10/31/2022   BASOSABS 0.1 10/31/2022     Last metabolic panel Lab Results  Component Value Date   NA 137 11/06/2022   K 4.6 11/06/2022   CL 100 11/06/2022   CO2 28 11/06/2022   BUN 30 (H) 11/06/2022   CREATININE 1.45 (H) 11/06/2022   GLUCOSE 99 11/06/2022   GFRNONAA 46 (L) 11/06/2022   GFRAA >60 11/04/2018   CALCIUM 9.0 11/06/2022   PHOS 4.0 09/03/2020   PROT 7.5 11/06/2022   ALBUMIN 3.0 (L) 11/06/2022   LABGLOB 3.2 06/11/2018   AGRATIO 1.2 06/11/2018   BILITOT 0.4 11/06/2022   ALKPHOS 66 11/06/2022   AST 17 11/06/2022   ALT 16 11/06/2022   ANIONGAP 9 11/06/2022    GFR: Estimated Creatinine Clearance: 99.2 mL/min (A) (by C-G formula based on SCr of 1.45 mg/dL (H)).  Recent Results (from the past 240 hour(s))  Urine  Culture     Status: Abnormal   Collection Time: 10/31/22  5:29 AM   Specimen: Urine, Catheterized  Result Value Ref Range Status   Specimen Description   Final    URINE, CATHETERIZED Performed at [facility], [address], [place], [place] [address]    Special Requests   Final    NONE Performed at [facility], [address] [place], [place] [address]    Culture (A)  Final    >=100,000 COLONIES/mL PROVIDENCIA STUARTII 50,000 COLONIES/mL  ESCHERICHIA COLI Confirmed Extended Spectrum Beta-Lactamase Producer (ESBL).  In bloodstream infections from ESBL organisms, carbapenems are preferred over piperacillin/tazobactam. They are shown to have a lower risk of mortality.    Report Status 11/03/2022 FINAL  Final   Organism ID, Bacteria PROVIDENCIA STUARTII (A)  Final   Organism ID, Bacteria ESCHERICHIA COLI (A)  Final      Susceptibility   Escherichia coli - MIC*    AMPICILLIN >=32 RESISTANT Resistant     CEFAZOLIN >=64 RESISTANT Resistant     CEFEPIME 4 INTERMEDIATE Intermediate     CEFTRIAXONE >=64 RESISTANT Resistant     CIPROFLOXACIN <=0.25 SENSITIVE Sensitive     GENTAMICIN >=16 RESISTANT Resistant     IMIPENEM <=0.25 SENSITIVE Sensitive     NITROFURANTOIN <=16 SENSITIVE Sensitive     TRIMETH/SULFA >=320 RESISTANT Resistant     AMPICILLIN/SULBACTAM >=32 RESISTANT Resistant     PIP/TAZO <=4 SENSITIVE Sensitive     * 50,000 COLONIES/mL ESCHERICHIA COLI   Providencia stuartii - MIC*    AMPICILLIN >=32 RESISTANT Resistant     CEFEPIME <=0.12 SENSITIVE Sensitive     CEFTRIAXONE <=0.25 SENSITIVE Sensitive     CIPROFLOXACIN >=4 RESISTANT Resistant     GENTAMICIN RESISTANT Resistant     IMIPENEM 4 SENSITIVE Sensitive     NITROFURANTOIN 128 RESISTANT Resistant     TRIMETH/SULFA >=320 RESISTANT Resistant     AMPICILLIN/SULBACTAM >=32 RESISTANT Resistant     PIP/TAZO <=4 SENSITIVE Sensitive     * >=100,000 COLONIES/mL PROVIDENCIA STUARTII      Radiology Studies: No results found.    LOS: 6 days    Jacquelin Hawking, MD [facility] 11/06/2022, 6:28 PM   If 7PM-7AM, please contact night-coverage [email or web address]

## 2022-11-07 DIAGNOSIS — N12 Tubulo-interstitial nephritis, not specified as acute or chronic: Secondary | ICD-10-CM | POA: Diagnosis not present

## 2022-11-07 NOTE — Progress Notes (Signed)
Physical Therapy Treatment Patient Details Name: Heather [name] MRN: [id number] DOB: 1975-08-02 Today's Date: 11/07/2022   History of Present Illness Heather [name] is a 47 y.o. female presents with several days of generalized fatigue, weakness, and difficulty walking. PMH: anxiety, arthritis, asthma, back pain, COPD, depression, diabetes, GERD, IBS, morbid obesity, neuromuscular disorder, schizoaffective disorder,    PT Comments  The patient is motivated to get up and into recliner. Patient requires min assistance to sit up, mod assistance for legs onto bed. Patient uses momentum to stand from bed,  Min assistance of 2 persons to step to recliner using a RW. Decreased control of descent. Continue PT for mobility and ambulation.   If plan is discharge home, recommend the following: A lot of help with bathing/dressing/bathroom;Assistance with cooking/housework;Assist for transportation;Help with stairs or ramp for entrance;Two people to help with walking and/or transfers   Can travel by private vehicle     No  Equipment Recommendations  None recommended by PT    Recommendations for Other Services       Precautions / Restrictions Precautions Precautions: Fall Precaution Comments: body habitus Restrictions Weight Bearing Restrictions: No     Mobility  Bed Mobility Overal bed mobility: Independent Bed Mobility: Rolling, Supine to Sit, Sit to Supine Rolling: Mod assist   Supine to sit: Min assist, HOB elevated Sit to supine: Mod assist   General bed mobility comments: She required moderately increased time and effort for supine to sit, uses bed rail and momentum to get body in position to sit mup, requires Hand support to sit upright,  mod assist to place legs onto bed as patient needed to lie back down. Assisted again to sit up    Transfers Overall transfer level: [name] assistance Equipment used: Rolling walker (2 wheels) Transfers: Sit to/from Stand, Bed to  chair/wheelchair/BSC Sit to Stand: Min assist, +2 safety/equipment, From elevated surface           General transfer comment: She stood from the edge of the bed with min  guard  able to  step to recliner and sit down.    Ambulation/Gait                   Stairs             Wheelchair Mobility     Tilt Bed    Modified Rankin (Stroke Patients Only)       Balance Overall balance assessment: [name] assistance Sitting-balance support: No upper extremity supported Sitting balance-Leahy Scale: Fair     Standing balance support: During functional activity, Bilateral upper extremity supported, Reliant on assistive device for balance Standing balance-Leahy Scale: Poor                              Cognition Arousal/Alertness: Awake/alert Behavior During Therapy: WFL for tasks assessed/performed Overall Cognitive Status: Within Functional Limits for tasks assessed                                          Exercises General Exercises - Lower Extremity Heel Slides: AROM, Both, Supine, 10 reps Hip ABduction/ADduction: AROM, Both, 10 reps, Supine Straight Leg Raises: 5 reps, Both, Supine    General Comments        Pertinent Vitals/Pain Pain Assessment Faces Pain Scale: Hurts even more Pain Location: R lower leg  with movement    Home Living                          Prior Function            PT Goals (current goals can now be found in the care plan section) Progress towards PT goals: Progressing toward goals    Frequency           PT Plan Current plan remains appropriate    Co-evaluation              AM-PAC PT "6 Clicks" Mobility   Outcome Measure  Help needed turning from your back to your side while in a flat bed without using bedrails?: A Little Help needed moving from lying on your back to sitting on the side of a flat bed without using bedrails?: A Little Help needed moving to and from a  bed to a chair (including a wheelchair)?: A Lot Help needed standing up from a chair using your arms (e.g., wheelchair or bedside chair)?: A Little Help needed to walk in hospital room?: Total Help needed climbing 3-5 steps with a railing? : Total 6 Click Score: 13    End of Session Equipment Utilized During Treatment: Gait belt Activity Tolerance: Patient tolerated treatment well Patient left: in chair;with call bell/phone within reach Nurse Communication: Mobility status       Time: 1132-1216 PT Time Calculation (min) (ACUTE ONLY): 44 min  Charges:    $Therapeutic Activity: 38-52 mins PT General Charges $$ ACUTE PT VISIT: 1 Visit                     Blanchard Kelch PT Acute Rehabilitation Services Office [phone] Weekend [phone]    Rada Hay 11/07/2022, 3:13 PM

## 2022-11-07 NOTE — Progress Notes (Signed)
Physical Therapy Treatment Patient Details Name: Heather [name] MRN: [id number] DOB: 1975/05/09 Today's Date: 11/07/2022   History of Present Illness Heather [name] is a 47 y.o. female presents with several days of generalized fatigue, weakness, and difficulty walking. PMH: anxiety, arthritis, asthma, back pain, COPD, depression, diabetes, GERD, IBS, morbid obesity, neuromuscular disorder, schizoaffective disorder,    PT Comments  Patient stood from recliner and stepped to bed  using Rw and +2 for safety. Continue PT for mobility.    If plan is discharge home, recommend the following: A lot of help with bathing/dressing/bathroom;Assistance with cooking/housework;Assist for transportation;Help with stairs or ramp for entrance;Two people to help with walking and/or transfers   Can travel by private vehicle     No  Equipment Recommendations  None recommended by PT    Recommendations for Other Services       Precautions / Restrictions Precautions Precautions: Fall Precaution Comments: body habitus Restrictions Weight Bearing Restrictions: No     Mobility  Bed Mobility Overal bed mobility: Independent Bed Mobility: Rolling, Supine to Sit, Sit to Supine Rolling: Mod assist   Supine to sit: Min assist, HOB elevated Sit to supine: Mod assist   General bed mobility comments: assist legs onto bed, 1 at a time    Transfers Overall transfer level: Needs assistance Equipment used: Rolling walker (2 wheels) Transfers: Sit to/from Stand, Bed to chair/wheelchair/BSC Sit to Stand: Min guard   Step pivot transfers: Min assist       General transfer comment: multiple attempts to power up  to stand from recliner, patient able to slowly rise, pulling on Rw.  Able to slowly step to  bed.    Ambulation/Gait                   Stairs             Wheelchair Mobility     Tilt Bed    Modified Rankin (Stroke Patients Only)       Balance Overall balance  assessment: Needs assistance Sitting-balance support: No upper extremity supported Sitting balance-Leahy Scale: Fair     Standing balance support: During functional activity, Bilateral upper extremity supported, Reliant on assistive device for balance Standing balance-Leahy Scale: Poor                              Cognition Arousal/Alertness: Awake/alert Behavior During Therapy: WFL for tasks assessed/performed Overall Cognitive Status: Within Functional Limits for tasks assessed                                          Exercises General Exercises - Lower Extremity Heel Slides: AROM, Both, Supine, 10 reps Hip ABduction/ADduction: AROM, Both, 10 reps, Supine Straight Leg Raises: 5 reps, Both, Supine    General Comments        Pertinent Vitals/Pain Pain Assessment Pain Assessment: No/denies pain Faces Pain Scale: Hurts even more Pain Location: R lower leg with movement    Home Living                          Prior Function            PT Goals (current goals can now be found in the care plan section) Progress towards PT goals: Progressing toward goals    Frequency  Min 1X/week      PT Plan Current plan remains appropriate    Co-evaluation              AM-PAC PT "6 Clicks" Mobility   Outcome Measure  Help needed turning from your back to your side while in a flat bed without using bedrails?: A Little Help needed moving from lying on your back to sitting on the side of a flat bed without using bedrails?: A Little Help needed moving to and from a bed to a chair (including a wheelchair)?: A Lot Help needed standing up from a chair using your arms (e.g., wheelchair or bedside chair)?: A Little Help needed to walk in hospital room?: Total Help needed climbing 3-5 steps with a railing? : Total 6 Click Score: 13    End of Session Equipment Utilized During Treatment: Gait belt Activity Tolerance: Patient tolerated  treatment well;Patient limited by fatigue Patient left: in bed;with call bell/phone within reach Nurse Communication: Mobility status       Time: 2841-3244 PT Time Calculation (min) (ACUTE ONLY): 26 min  Charges:    $Therapeutic Activity: 23-37 mins PT General Charges $$ ACUTE PT VISIT: 1 Visit                     Blanchard Kelch PT Acute Rehabilitation Services Office [phone] Weekend [phone]    Rada Hay 11/07/2022, 3:22 PM

## 2022-11-07 NOTE — Plan of Care (Signed)
  Problem: Education: Goal: Knowledge of General Education information will improve Description Including pain rating scale, medication(s)/side effects and non-pharmacologic comfort measures Outcome: Progressing   Problem: Health Behavior/Discharge Planning: Goal: Ability to manage health-related needs will improve Outcome: Progressing   

## 2022-11-07 NOTE — Progress Notes (Signed)
   11/07/22 2158  BiPAP/CPAP/SIPAP  BiPAP/CPAP/SIPAP Pt Type Adult  BiPAP/CPAP/SIPAP Resmed (remains in room in case she changes her mind)  Reason BIPAP/CPAP not in use Non-compliant (Patient refused CPAP qhs.  She states that she is unable to tolerate it.)

## 2022-11-07 NOTE — Progress Notes (Signed)
PROGRESS NOTE    Heather [name]  QMV:[id number] DOB: 28-Jul-1975 DOA: 10/30/2022 PCP: Renaye Rakers, MD   Brief Narrative: Heather [name] is a 47 y.o. female with a history of right renal mass status post nephrectomy, depression, morbid obesity, CKD stage IIIb, COPD, schizoaffective disorder.  Patient presented secondary to secondary to increased weakness and difficulty ambulating.  Patient was found to have evidence of pyelonephritis with cultures significant for ESBL E. coli and Providencia stuartii and was treated with meropenem.   Assessment and Plan:  Pyelonephritis Patient with associated dysuria on admission.  Elevated temperature without true fever in addition to leukocytosis also noted on admission.  Urinalysis was suggestive of UTI.  Patient started empirically on meropenem secondary to history of ESBL infection.  Urine culture obtained and was significant for Providencia stuartii in addition to ESBL E. coli.  Patient completed a 5-day course of antibiotics.  CKD stage IIIb Stable.  COPD No evidence of exacerbation at this time. -Continue Dulera, Singulair -Continue albuterol as needed  Depression Schizoaffective disorder -Continue Cymbalta, Abilify   DVT prophylaxis: Lovenox Code Status:   Code Status: Full Code Family Communication: None at bedside Disposition Plan: Discharge to SNF once bed is available   Consultants:  None  Procedures:  None  Antimicrobials: Meropenem   Subjective: No concerns today.  Objective: BP 136/66 (BP Location: Right Arm)   Pulse 90   Temp 98.5 F (36.9 C) (Oral)   Resp 18   Ht 5\' 3"  (1.6 m)   Wt (!) 238.6 kg   SpO2 99%   BMI 93.18 kg/m   Examination:  General: Well appearing, no distress   Data Reviewed: I have personally reviewed following labs and imaging studies  CBC Lab Results  Component Value Date   WBC 10.5 11/06/2022   RBC 4.10 11/06/2022   HGB 9.4 (L) 11/06/2022   HCT 34.4 (L) 11/06/2022    MCV 83.9 11/06/2022   MCH 22.9 (L) 11/06/2022   PLT 445 (H) 11/06/2022   MCHC 27.3 (L) 11/06/2022   RDW 19.5 (H) 11/06/2022   LYMPHSABS 2.3 10/31/2022   MONOABS 0.8 10/31/2022   EOSABS 0.6 (H) 10/31/2022   BASOSABS 0.1 10/31/2022     Last metabolic panel Lab Results  Component Value Date   NA 137 11/06/2022   K 4.6 11/06/2022   CL 100 11/06/2022   CO2 28 11/06/2022   BUN 30 (H) 11/06/2022   CREATININE 1.45 (H) 11/06/2022   GLUCOSE 99 11/06/2022   GFRNONAA 46 (L) 11/06/2022   GFRAA >60 11/04/2018   CALCIUM 9.0 11/06/2022   PHOS 4.0 09/03/2020   PROT 7.5 11/06/2022   ALBUMIN 3.0 (L) 11/06/2022   LABGLOB 3.2 06/11/2018   AGRATIO 1.2 06/11/2018   BILITOT 0.4 11/06/2022   ALKPHOS 66 11/06/2022   AST 17 11/06/2022   ALT 16 11/06/2022   ANIONGAP 9 11/06/2022    GFR: Estimated Creatinine Clearance: 99.2 mL/min (A) (by C-G formula based on SCr of 1.45 mg/dL (H)).  Recent Results (from the past 240 hour(s))  Urine Culture     Status: Abnormal   Collection Time: 10/31/22  5:29 AM   Specimen: Urine, Catheterized  Result Value Ref Range Status   Specimen Description   Final    URINE, CATHETERIZED Performed at [facility], [address], [place], [place] [address]    Special Requests   Final    NONE Performed at [facility], [address] [place], [place] [address]  Culture (A)  Final    >=100,000 COLONIES/mL PROVIDENCIA STUARTII 50,000 COLONIES/mL ESCHERICHIA COLI Confirmed Extended Spectrum Beta-Lactamase Producer (ESBL).  In bloodstream infections from ESBL organisms, carbapenems are preferred over piperacillin/tazobactam. They are shown to have a lower risk of mortality.    Report Status 11/03/2022 FINAL  Final   Organism ID, Bacteria PROVIDENCIA STUARTII (A)  Final   Organism ID, Bacteria ESCHERICHIA COLI (A)  Final      Susceptibility   Escherichia coli - MIC*    AMPICILLIN >=32 RESISTANT Resistant     CEFAZOLIN  >=64 RESISTANT Resistant     CEFEPIME 4 INTERMEDIATE Intermediate     CEFTRIAXONE >=64 RESISTANT Resistant     CIPROFLOXACIN <=0.25 SENSITIVE Sensitive     GENTAMICIN >=16 RESISTANT Resistant     IMIPENEM <=0.25 SENSITIVE Sensitive     NITROFURANTOIN <=16 SENSITIVE Sensitive     TRIMETH/SULFA >=320 RESISTANT Resistant     AMPICILLIN/SULBACTAM >=32 RESISTANT Resistant     PIP/TAZO <=4 SENSITIVE Sensitive     * 50,000 COLONIES/mL ESCHERICHIA COLI   Providencia stuartii - MIC*    AMPICILLIN >=32 RESISTANT Resistant     CEFEPIME <=0.12 SENSITIVE Sensitive     CEFTRIAXONE <=0.25 SENSITIVE Sensitive     CIPROFLOXACIN >=4 RESISTANT Resistant     GENTAMICIN RESISTANT Resistant     IMIPENEM 4 SENSITIVE Sensitive     NITROFURANTOIN 128 RESISTANT Resistant     TRIMETH/SULFA >=320 RESISTANT Resistant     AMPICILLIN/SULBACTAM >=32 RESISTANT Resistant     PIP/TAZO <=4 SENSITIVE Sensitive     * >=100,000 COLONIES/mL PROVIDENCIA STUARTII      Radiology Studies: No results found.    LOS: 7 days    Jacquelin Hawking, MD [facility] 11/07/2022, 3:39 PM   If 7PM-7AM, please contact night-coverage [email or web address]

## 2022-11-08 DIAGNOSIS — N12 Tubulo-interstitial nephritis, not specified as acute or chronic: Secondary | ICD-10-CM | POA: Diagnosis not present

## 2022-11-08 NOTE — Plan of Care (Signed)

## 2022-11-08 NOTE — Progress Notes (Signed)
PROGRESS NOTE    Heather [name]  ZOX:[id number] DOB: May 17, 1975 DOA: 10/30/2022 PCP: Renaye Rakers, MD   Brief Narrative: Heather [name] is a 47 y.o. female with a history of right renal mass status post nephrectomy, depression, morbid obesity, CKD stage IIIb, COPD, schizoaffective disorder.  Patient presented secondary to secondary to increased weakness and difficulty ambulating.  Patient was found to have evidence of pyelonephritis with cultures significant for ESBL E. coli and Providencia stuartii and was treated with meropenem.   Assessment and Plan:  Pyelonephritis Patient with associated dysuria on admission.  Elevated temperature without true fever in addition to leukocytosis also noted on admission.  Urinalysis was suggestive of UTI.  Patient started empirically on meropenem secondary to history of ESBL infection.  Urine culture obtained and was significant for Providencia stuartii in addition to ESBL E. coli.  Patient completed a 5-day course of antibiotics.  CKD stage IIIb Stable.  COPD No evidence of exacerbation at this time. -Continue Dulera, Singulair -Continue albuterol as needed  Depression Schizoaffective disorder -Continue Cymbalta, Abilify   DVT prophylaxis: Lovenox Code Status:   Code Status: Full Code Family Communication: None at bedside Disposition Plan: Discharge to SNF once bed is available   Consultants:  None  Procedures:  None  Antimicrobials: Meropenem   Subjective: Patient concerns about how long it is taking to get to a facility. She also reports some left sided abdominal pain.  Objective: BP (!) 123/55 (BP Location: Right Arm)   Pulse 85   Temp 98.3 F (36.8 C) (Oral)   Resp 18   Ht 5\' 3"  (1.6 m)   Wt (!) 238.6 kg   SpO2 94%   BMI 93.18 kg/m   Examination:  General exam: Appears calm and comfortable Gastrointestinal system: Abdomen is obese, soft and minimally tender.    Data Reviewed: I have personally  reviewed following labs and imaging studies  CBC Lab Results  Component Value Date   WBC 10.5 11/06/2022   RBC 4.10 11/06/2022   HGB 9.4 (L) 11/06/2022   HCT 34.4 (L) 11/06/2022   MCV 83.9 11/06/2022   MCH 22.9 (L) 11/06/2022   PLT 445 (H) 11/06/2022   MCHC 27.3 (L) 11/06/2022   RDW 19.5 (H) 11/06/2022   LYMPHSABS 2.3 10/31/2022   MONOABS 0.8 10/31/2022   EOSABS 0.6 (H) 10/31/2022   BASOSABS 0.1 10/31/2022     Last metabolic panel Lab Results  Component Value Date   NA 137 11/06/2022   K 4.6 11/06/2022   CL 100 11/06/2022   CO2 28 11/06/2022   BUN 30 (H) 11/06/2022   CREATININE 1.45 (H) 11/06/2022   GLUCOSE 99 11/06/2022   GFRNONAA 46 (L) 11/06/2022   GFRAA >60 11/04/2018   CALCIUM 9.0 11/06/2022   PHOS 4.0 09/03/2020   PROT 7.5 11/06/2022   ALBUMIN 3.0 (L) 11/06/2022   LABGLOB 3.2 06/11/2018   AGRATIO 1.2 06/11/2018   BILITOT 0.4 11/06/2022   ALKPHOS 66 11/06/2022   AST 17 11/06/2022   ALT 16 11/06/2022   ANIONGAP 9 11/06/2022    GFR: Estimated Creatinine Clearance: 99.2 mL/min (A) (by C-G formula based on SCr of 1.45 mg/dL (H)).  Recent Results (from the past 240 hour(s))  Urine Culture     Status: Abnormal   Collection Time: 10/31/22  5:29 AM   Specimen: Urine, Catheterized  Result Value Ref Range Status   Specimen Description   Final    URINE, CATHETERIZED Performed at [facility], [address], [place], [place] [address]    Special Requests   Final    NONE Performed at [facility], [address] [place], [place] [address]    Culture (A)  Final    >=100,000 COLONIES/mL PROVIDENCIA STUARTII 50,000 COLONIES/mL ESCHERICHIA COLI Confirmed Extended Spectrum Beta-Lactamase Producer (ESBL).  In bloodstream infections from ESBL organisms, carbapenems are preferred over piperacillin/tazobactam. They are shown to have a lower risk of mortality.    Report Status 11/03/2022 FINAL  Final   Organism ID, Bacteria  PROVIDENCIA STUARTII (A)  Final   Organism ID, Bacteria ESCHERICHIA COLI (A)  Final      Susceptibility   Escherichia coli - MIC*    AMPICILLIN >=32 RESISTANT Resistant     CEFAZOLIN >=64 RESISTANT Resistant     CEFEPIME 4 INTERMEDIATE Intermediate     CEFTRIAXONE >=64 RESISTANT Resistant     CIPROFLOXACIN <=0.25 SENSITIVE Sensitive     GENTAMICIN >=16 RESISTANT Resistant     IMIPENEM <=0.25 SENSITIVE Sensitive     NITROFURANTOIN <=16 SENSITIVE Sensitive     TRIMETH/SULFA >=320 RESISTANT Resistant     AMPICILLIN/SULBACTAM >=32 RESISTANT Resistant     PIP/TAZO <=4 SENSITIVE Sensitive     * 50,000 COLONIES/mL ESCHERICHIA COLI   Providencia stuartii - MIC*    AMPICILLIN >=32 RESISTANT Resistant     CEFEPIME <=0.12 SENSITIVE Sensitive     CEFTRIAXONE <=0.25 SENSITIVE Sensitive     CIPROFLOXACIN >=4 RESISTANT Resistant     GENTAMICIN RESISTANT Resistant     IMIPENEM 4 SENSITIVE Sensitive     NITROFURANTOIN 128 RESISTANT Resistant     TRIMETH/SULFA >=320 RESISTANT Resistant     AMPICILLIN/SULBACTAM >=32 RESISTANT Resistant     PIP/TAZO <=4 SENSITIVE Sensitive     * >=100,000 COLONIES/mL PROVIDENCIA STUARTII      Radiology Studies: No results found.    LOS: 8 days    Jacquelin Hawking, MD [facility] 11/08/2022, 10:21 AM   If 7PM-7AM, please contact night-coverage [email or web address]

## 2022-11-08 NOTE — Progress Notes (Signed)
   11/08/22 2134  BiPAP/CPAP/SIPAP  BiPAP/CPAP/SIPAP Pt Type Adult  Reason BIPAP/CPAP not in use Non-compliant (Patient continues to refuse CPAP qhs.  Machine remains in room in case she changes her mind.)

## 2022-11-09 DIAGNOSIS — N12 Tubulo-interstitial nephritis, not specified as acute or chronic: Secondary | ICD-10-CM | POA: Diagnosis not present

## 2022-11-09 NOTE — Plan of Care (Signed)

## 2022-11-09 NOTE — Progress Notes (Signed)
Physical Therapy Treatment Patient Details Name: Heather [name] MRN: [id number] DOB: April 02, 1976 Today's Date: 11/09/2022   History of Present Illness Heather [name] is a 47 y.o. female presents with several days of generalized fatigue, weakness, and difficulty walking. PMH: anxiety, arthritis, asthma, back pain, COPD, depression, diabetes, GERD, IBS, morbid obesity, neuromuscular disorder, schizoaffective disorder,    PT Comments  Pt received supine in bed asleep but agreeable to mobilize OOB. Pt required modA+2 to complete bed mobility, minA+1 for transfers, pt in recliner at end of session with lift pad underneath. Encouraged pt to sit up as long as possible and to change positions regularly. Pt declined ambulation at this time. We will continue to follow acutely.    If plan is discharge home, recommend the following: A lot of help with bathing/dressing/bathroom;Assistance with cooking/housework;Assist for transportation;Help with stairs or ramp for entrance;Two people to help with walking and/or transfers   Can travel by private vehicle     No  Equipment Recommendations  None recommended by PT (TBD @ next level of care)    Recommendations for Other Services       Precautions / Restrictions Precautions Precautions: Fall Precaution Comments: body habitus Restrictions Weight Bearing Restrictions: No     Mobility  Bed Mobility Overal bed mobility: Needs Assistance Bed Mobility: Supine to Sit     Supine to sit: HOB elevated, Mod assist, +2 for safety/equipment, +2 for physical assistance     General bed mobility comments: Pt required modA+2  for trunk elevation, HOB elevated and pt with use of bed rails, once siting EOB initially pt requiring min-modA+1 to prevent R lateral LOB, pt stating "I'm sitting in the crack of the bed," lowered HOB and pt able to sit with SUP only.    Transfers Overall transfer level: Needs assistance Equipment used: Rolling walker (2  wheels) Transfers: Sit to/from Stand, Bed to chair/wheelchair/BSC Sit to Stand: +2 safety/equipment, Min assist   Step pivot transfers: Min assist, +2 safety/equipment       General transfer comment: Pt required minA to steady AD and utilized rocking motion to power to stand but able to complete transfer with min guard for body movement. Pt guided through step pivot transfer with bari-RW with minA for steadying of RW, no physical assist provided to patient; once in recliner pt able to self-reposition, lift pad under pt so nursing can return pt to bed if preferred.    Ambulation/Gait               General Gait Details: Pt declined secondary to fatigue.   Stairs             Wheelchair Mobility     Tilt Bed    Modified Rankin (Stroke Patients Only)       Balance Overall balance assessment: Needs assistance Sitting-balance support: No upper extremity supported Sitting balance-Leahy Scale: Fair Sitting balance - Comments: SBA for ~5 minutes seated EOB   Standing balance support: During functional activity, Bilateral upper extremity supported, Reliant on assistive device for balance Standing balance-Leahy Scale: Poor                              Cognition Arousal/Alertness: Awake/alert Behavior During Therapy: WFL for tasks assessed/performed Overall Cognitive Status: Within Functional Limits for tasks assessed  Exercises      General Comments        Pertinent Vitals/Pain Pain Assessment Pain Assessment: No/denies pain Faces Pain Scale: Hurts even more Pain Location: R lower leg with movement Pain Descriptors / Indicators: Discomfort Pain Intervention(s): Monitored during session, Repositioned    Home Living                          Prior Function            PT Goals (current goals can now be found in the care plan section) Acute Rehab PT Goals Patient Stated Goal:  "see my kids" PT Goal Formulation: With patient Time For Goal Achievement: 11/14/22 Potential to Achieve Goals: Fair Progress towards PT goals: Progressing toward goals    Frequency    Min 1X/week      PT Plan Current plan remains appropriate    Co-evaluation              AM-PAC PT "6 Clicks" Mobility   Outcome Measure  Help needed turning from your back to your side while in a flat bed without using bedrails?: A Little Help needed moving from lying on your back to sitting on the side of a flat bed without using bedrails?: A Little Help needed moving to and from a bed to a chair (including a wheelchair)?: A Lot Help needed standing up from a chair using your arms (e.g., wheelchair or bedside chair)?: A Little Help needed to walk in hospital room?: Total Help needed climbing 3-5 steps with a railing? : Total 6 Click Score: 13    End of Session Equipment Utilized During Treatment: Gait belt (doubled) Activity Tolerance: Patient tolerated treatment well;Patient limited by fatigue Patient left: with call bell/phone within reach;in chair;with chair alarm set Nurse Communication: Mobility status PT Visit Diagnosis: Other abnormalities of gait and mobility (R26.89);Muscle weakness (generalized) (M62.81) Pain - Right/Left: Right Pain - part of body: Leg     Time: 9604-5409 PT Time Calculation (min) (ACUTE ONLY): 18 min  Charges:    $Therapeutic Activity: 8-22 mins PT General Charges $$ ACUTE PT VISIT: 1 Visit                     Jamesetta Geralds, PT, DPT [facility] Rehabilitation Department Office: [phone]   Jamesetta Geralds 11/09/2022, 12:55 PM

## 2022-11-09 NOTE — Progress Notes (Signed)
PROGRESS NOTE    Heather [name]  ZOX:[id number] DOB: 1976-01-23 DOA: 10/30/2022 PCP: Renaye Rakers, MD   Brief Narrative: Heather [name] is a 47 y.o. female with a history of right renal mass status post nephrectomy, depression, morbid obesity, CKD stage IIIb, COPD, schizoaffective disorder.  Patient presented secondary to secondary to increased weakness and difficulty ambulating.  Patient was found to have evidence of pyelonephritis with cultures significant for ESBL E. coli and Providencia stuartii and was treated with meropenem.   Assessment and Plan:  Pyelonephritis Patient with associated dysuria on admission.  Elevated temperature without true fever in addition to leukocytosis also noted on admission.  Urinalysis was suggestive of UTI.  Patient started empirically on meropenem secondary to history of ESBL infection.  Urine culture obtained and was significant for Providencia stuartii in addition to ESBL E. coli.  Patient completed a 5-day course of antibiotics.  CKD stage IIIb Stable.  COPD No evidence of exacerbation at this time. -Continue Dulera, Singulair -Continue albuterol as needed  Depression Schizoaffective disorder -Continue Cymbalta, Abilify   DVT prophylaxis: Lovenox Code Status:   Code Status: Full Code Family Communication: None at bedside Disposition Plan: Discharge to SNF once bed is available   Consultants:  None  Procedures:  None  Antimicrobials: Meropenem   Subjective: Abdominal pain improved today.  Objective: BP (!) 116/52 (BP Location: Right Arm)   Pulse 99   Temp 98.5 F (36.9 C) (Oral)   Resp 18   Ht 5\' 3"  (1.6 m)   Wt (!) 238.2 kg   SpO2 97%   BMI 93.02 kg/m   Examination:  General exam: Appears calm and comfortable    Data Reviewed: I have personally reviewed following labs and imaging studies  CBC Lab Results  Component Value Date   WBC 10.5 11/06/2022   RBC 4.10 11/06/2022   HGB 9.4 (L) 11/06/2022    HCT 34.4 (L) 11/06/2022   MCV 83.9 11/06/2022   MCH 22.9 (L) 11/06/2022   PLT 445 (H) 11/06/2022   MCHC 27.3 (L) 11/06/2022   RDW 19.5 (H) 11/06/2022   LYMPHSABS 2.3 10/31/2022   MONOABS 0.8 10/31/2022   EOSABS 0.6 (H) 10/31/2022   BASOSABS 0.1 10/31/2022     Last metabolic panel Lab Results  Component Value Date   NA 137 11/06/2022   K 4.6 11/06/2022   CL 100 11/06/2022   CO2 28 11/06/2022   BUN 30 (H) 11/06/2022   CREATININE 1.45 (H) 11/06/2022   GLUCOSE 99 11/06/2022   GFRNONAA 46 (L) 11/06/2022   GFRAA >60 11/04/2018   CALCIUM 9.0 11/06/2022   PHOS 4.0 09/03/2020   PROT 7.5 11/06/2022   ALBUMIN 3.0 (L) 11/06/2022   LABGLOB 3.2 06/11/2018   AGRATIO 1.2 06/11/2018   BILITOT 0.4 11/06/2022   ALKPHOS 66 11/06/2022   AST 17 11/06/2022   ALT 16 11/06/2022   ANIONGAP 9 11/06/2022    GFR: Estimated Creatinine Clearance: 99 mL/min (A) (by C-G formula based on SCr of 1.45 mg/dL (H)).  Recent Results (from the past 240 hour(s))  Urine Culture     Status: Abnormal   Collection Time: 10/31/22  5:29 AM   Specimen: Urine, Catheterized  Result Value Ref Range Status   Specimen Description   Final    URINE, CATHETERIZED Performed at [facility], [address], [place], [place] [address]    Special Requests   Final    NONE Performed at [facility], [address] [place], [place] [address]    Culture (A)  Final    >=100,000 COLONIES/mL PROVIDENCIA STUARTII 50,000 COLONIES/mL ESCHERICHIA COLI Confirmed Extended Spectrum Beta-Lactamase Producer (ESBL).  In bloodstream infections from ESBL organisms, carbapenems are preferred over piperacillin/tazobactam. They are shown to have a lower risk of mortality.    Report Status 11/03/2022 FINAL  Final   Organism ID, Bacteria PROVIDENCIA STUARTII (A)  Final   Organism ID, Bacteria ESCHERICHIA COLI (A)  Final      Susceptibility   Escherichia coli - MIC*    AMPICILLIN >=32 RESISTANT  Resistant     CEFAZOLIN >=64 RESISTANT Resistant     CEFEPIME 4 INTERMEDIATE Intermediate     CEFTRIAXONE >=64 RESISTANT Resistant     CIPROFLOXACIN <=0.25 SENSITIVE Sensitive     GENTAMICIN >=16 RESISTANT Resistant     IMIPENEM <=0.25 SENSITIVE Sensitive     NITROFURANTOIN <=16 SENSITIVE Sensitive     TRIMETH/SULFA >=320 RESISTANT Resistant     AMPICILLIN/SULBACTAM >=32 RESISTANT Resistant     PIP/TAZO <=4 SENSITIVE Sensitive     * 50,000 COLONIES/mL ESCHERICHIA COLI   Providencia stuartii - MIC*    AMPICILLIN >=32 RESISTANT Resistant     CEFEPIME <=0.12 SENSITIVE Sensitive     CEFTRIAXONE <=0.25 SENSITIVE Sensitive     CIPROFLOXACIN >=4 RESISTANT Resistant     GENTAMICIN RESISTANT Resistant     IMIPENEM 4 SENSITIVE Sensitive     NITROFURANTOIN 128 RESISTANT Resistant     TRIMETH/SULFA >=320 RESISTANT Resistant     AMPICILLIN/SULBACTAM >=32 RESISTANT Resistant     PIP/TAZO <=4 SENSITIVE Sensitive     * >=100,000 COLONIES/mL PROVIDENCIA STUARTII      Radiology Studies: No results found.    LOS: 9 days    Jacquelin Hawking, MD [facility] 11/09/2022, 1:31 PM   If 7PM-7AM, please contact night-coverage [email or web address]

## 2022-11-10 DIAGNOSIS — N12 Tubulo-interstitial nephritis, not specified as acute or chronic: Secondary | ICD-10-CM | POA: Diagnosis not present

## 2022-11-10 NOTE — Plan of Care (Signed)

## 2022-11-10 NOTE — Plan of Care (Signed)

## 2022-11-10 NOTE — Progress Notes (Signed)
   11/10/22 2352  BiPAP/CPAP/SIPAP  BiPAP/CPAP/SIPAP Pt Type Adult  BiPAP/CPAP/SIPAP Resmed  Mask Type Nasal mask  Mask Size Medium  Respiratory Rate 20 breaths/min  FiO2 (%) 21 %  Patient Home Equipment No  Auto Titrate Yes (5-20 cmH2O)

## 2022-11-10 NOTE — Progress Notes (Signed)
   PROGRESS NOTE    Heather [name]  RUE:[id number] DOB: 04-01-1976 DOA: 10/30/2022 PCP: Renaye Rakers, MD   Brief Narrative: Heather [name] is a 47 y.o. female with a history of right renal mass status post nephrectomy, depression, morbid obesity, CKD stage IIIb, COPD, schizoaffective disorder.  Patient presented secondary to secondary to increased weakness and difficulty ambulating.  Patient was found to have evidence of pyelonephritis with cultures significant for ESBL E. coli and Providencia stuartii and was treated with meropenem.   Assessment and Plan:  Pyelonephritis Patient with associated dysuria on admission.  Elevated temperature without true fever in addition to leukocytosis also noted on admission.  Urinalysis was suggestive of UTI.  Patient started empirically on meropenem secondary to history of ESBL infection.  Urine culture obtained and was significant for Providencia stuartii in addition to ESBL E. coli.  Patient completed a 5-day course of antibiotics.  CKD stage IIIb Stable.  COPD No evidence of exacerbation at this time. -Continue Dulera, Singulair -Continue albuterol as needed  Depression Schizoaffective disorder -Continue Cymbalta, Abilify   DVT prophylaxis: Lovenox Code Status:   Code Status: Full Code Family Communication: None at bedside Disposition Plan: Discharge to SNF once bed is available   Consultants:  None  Procedures:  None  Antimicrobials: Meropenem   Subjective: Patient reports recurrent left-sided abdominal pain that resolved after having a bowel movement.  No other issues.  Objective: BP 120/63 (BP Location: Right Arm)   Pulse 91   Temp 98.4 F (36.9 C) (Oral)   Resp 18   Ht 5\' 3"  (1.6 m)   Wt (!) 238.2 kg   SpO2 91%   BMI 93.02 kg/m   Examination:  General exam: Appears calm and comfortable  Data Reviewed: I have personally reviewed following labs and imaging studies  CBC Lab Results  Component Value  Date   WBC 10.5 11/06/2022   RBC 4.10 11/06/2022   HGB 9.4 (L) 11/06/2022   HCT 34.4 (L) 11/06/2022   MCV 83.9 11/06/2022   MCH 22.9 (L) 11/06/2022   PLT 445 (H) 11/06/2022   MCHC 27.3 (L) 11/06/2022   RDW 19.5 (H) 11/06/2022   LYMPHSABS 2.3 10/31/2022   MONOABS 0.8 10/31/2022   EOSABS 0.6 (H) 10/31/2022   BASOSABS 0.1 10/31/2022     Last metabolic panel Lab Results  Component Value Date   NA 137 11/06/2022   K 4.6 11/06/2022   CL 100 11/06/2022   CO2 28 11/06/2022   BUN 30 (H) 11/06/2022   CREATININE 1.45 (H) 11/06/2022   GLUCOSE 99 11/06/2022   GFRNONAA 46 (L) 11/06/2022   GFRAA >60 11/04/2018   CALCIUM 9.0 11/06/2022   PHOS 4.0 09/03/2020   PROT 7.5 11/06/2022   ALBUMIN 3.0 (L) 11/06/2022   LABGLOB 3.2 06/11/2018   AGRATIO 1.2 06/11/2018   BILITOT 0.4 11/06/2022   ALKPHOS 66 11/06/2022   AST 17 11/06/2022   ALT 16 11/06/2022   ANIONGAP 9 11/06/2022    GFR: Estimated Creatinine Clearance: 99 mL/min (A) (by C-G formula based on SCr of 1.45 mg/dL (H)).  No results found for this or any previous visit (from the past 240 hour(s)).     Radiology Studies: No results found.    LOS: 10 days    Jacquelin Hawking, MD [facility] 11/10/2022, 1:01 PM   If 7PM-7AM, please contact night-coverage [email or web address]

## 2022-11-11 DIAGNOSIS — N12 Tubulo-interstitial nephritis, not specified as acute or chronic: Secondary | ICD-10-CM | POA: Diagnosis not present

## 2022-11-11 NOTE — TOC Progression Note (Addendum)
Transition of Care [facility]) - Progression Note    Patient Details  Name: Heather [name] MRN: [id number] Date of Birth: 09-27-1975  [facility] CM/SW Contact  Beckie Busing, RN Phone [phone]  11/11/2022, 3:00 PM  Clinical Narrative:    Cm received call from patient stating that she has spoken to [facility] and they have a long term care bed available. CM has reached out to [place] with admissions. Message has been left . Patient forwarded number for [facility] Admissions director. Per [facility] facility has received patients info and will review to determine if they can offer long term bed. TOC continues to follow for placement.   Currently at 34 declines for bed request.    Expected Discharge Plan: Skilled Nursing Facility Barriers to Discharge: Continued Medical Work up  Expected Discharge Plan and Services In-house Referral: NA Discharge Planning Services: CM Consult   Living arrangements for the past 2 months: Hotel/Motel                 DME Arranged: N/A DME Agency: NA       HH Arranged: NA HH Agency: NA         Social Determinants of Health (SDOH) Interventions SDOH Screenings   Food Insecurity: No Food Insecurity (10/31/2022)  Housing: Patient Declined (10/31/2022)  Transportation Needs: Unmet Transportation Needs (10/31/2022)  Utilities: Not At Risk (10/31/2022)  Depression (PHQ2-9): Medium Risk (04/22/2022)  Social Connections: Unknown (09/30/2022)   Received from [facility], [facility]  Stress: No Stress Concern Present (10/01/2022)   Received from [facility], [facility]  Tobacco Use: Medium Risk (10/30/2022)    Readmission Risk Interventions    11/01/2022   11:56 AM 10/31/2022    2:42 PM 12/08/2020    1:37 PM  Readmission Risk Prevention Plan  Post Dischage Appt Complete    Medication Screening Complete    Transportation Screening Complete  Complete  PCP or Specialist Appt within 5-7 Days  Complete   Home Care  Screening  Complete   Medication Review (RN CM)  Referral to Pharmacy   Medication Review (RN Care Manager)   Complete  PCP or Specialist appointment within 3-5 days of discharge   Complete  HRI or Home Care Consult   Complete  SW Recovery Care/Counseling Consult   Complete  Palliative Care Screening   Not Applicable  Skilled Nursing Facility   Not Applicable

## 2022-11-11 NOTE — Plan of Care (Signed)

## 2022-11-11 NOTE — Progress Notes (Signed)
   PROGRESS NOTE    Heather [name]  WUJ:[id number] DOB: 03-25-1976 DOA: 10/30/2022 PCP: Renaye Rakers, MD   Brief Narrative: Heather [name] is a 47 y.o. female with a history of right renal mass status post nephrectomy, depression, morbid obesity, CKD stage IIIb, COPD, schizoaffective disorder.  Patient presented secondary to secondary to increased weakness and difficulty ambulating.  Patient was found to have evidence of pyelonephritis with cultures significant for ESBL E. coli and Providencia stuartii and was treated with meropenem.   Assessment and Plan:  Pyelonephritis Patient with associated dysuria on admission.  Elevated temperature without true fever in addition to leukocytosis also noted on admission.  Urinalysis was suggestive of UTI.  Patient started empirically on meropenem secondary to history of ESBL infection.  Urine culture obtained and was significant for Providencia stuartii in addition to ESBL E. coli.  Patient completed a 5-day course of antibiotics.  CKD stage IIIb Stable.  COPD No evidence of exacerbation at this time. -Continue Dulera, Singulair -Continue albuterol as needed  Depression Schizoaffective disorder -Continue Cymbalta, Abilify   DVT prophylaxis: Lovenox Code Status:   Code Status: Full Code Family Communication: None at bedside Disposition Plan: Discharge to SNF once bed is available   Consultants:  None  Procedures:  None  Antimicrobials: Meropenem   Subjective: No issues noted from overnight events.  Objective: BP 111/65 (BP Location: Right Arm)   Pulse 82   Temp 98.5 F (36.9 C) (Oral)   Resp 18   Ht 5\' 3"  (1.6 m)   Wt (!) 238.2 kg   SpO2 92%   BMI 93.02 kg/m   Examination:  General exam: Appears calm and comfortable  Data Reviewed: I have personally reviewed following labs and imaging studies  CBC Lab Results  Component Value Date   WBC 10.5 11/06/2022   RBC 4.10 11/06/2022   HGB 9.4 (L) 11/06/2022    HCT 34.4 (L) 11/06/2022   MCV 83.9 11/06/2022   MCH 22.9 (L) 11/06/2022   PLT 445 (H) 11/06/2022   MCHC 27.3 (L) 11/06/2022   RDW 19.5 (H) 11/06/2022   LYMPHSABS 2.3 10/31/2022   MONOABS 0.8 10/31/2022   EOSABS 0.6 (H) 10/31/2022   BASOSABS 0.1 10/31/2022     Last metabolic panel Lab Results  Component Value Date   NA 137 11/06/2022   K 4.6 11/06/2022   CL 100 11/06/2022   CO2 28 11/06/2022   BUN 30 (H) 11/06/2022   CREATININE 1.45 (H) 11/06/2022   GLUCOSE 99 11/06/2022   GFRNONAA 46 (L) 11/06/2022   GFRAA >60 11/04/2018   CALCIUM 9.0 11/06/2022   PHOS 4.0 09/03/2020   PROT 7.5 11/06/2022   ALBUMIN 3.0 (L) 11/06/2022   LABGLOB 3.2 06/11/2018   AGRATIO 1.2 06/11/2018   BILITOT 0.4 11/06/2022   ALKPHOS 66 11/06/2022   AST 17 11/06/2022   ALT 16 11/06/2022   ANIONGAP 9 11/06/2022    GFR: Estimated Creatinine Clearance: 99 mL/min (A) (by C-G formula based on SCr of 1.45 mg/dL (H)).  No results found for this or any previous visit (from the past 240 hour(s)).     Radiology Studies: No results found.    LOS: 11 days    Jacquelin Hawking, MD [facility] 11/11/2022, 9:22 AM   If 7PM-7AM, please contact night-coverage [email or web address]

## 2022-11-12 DIAGNOSIS — N12 Tubulo-interstitial nephritis, not specified as acute or chronic: Secondary | ICD-10-CM | POA: Diagnosis not present

## 2022-11-12 MED ORDER — GABAPENTIN 100 MG PO CAPS: 100.0000 mg | ORAL_CAPSULE | Freq: Four times a day (QID) | ORAL | Status: AC

## 2022-11-12 MED ORDER — GERHARDT'S BUTT CREAM: 1.0000 | TOPICAL_CREAM | Freq: Two times a day (BID) | CUTANEOUS | Status: AC

## 2022-11-12 MED ORDER — DICLOFENAC SODIUM 1 % EX GEL: 4.0000 g | Freq: Three times a day (TID) | CUTANEOUS | Status: AC | PRN

## 2022-11-12 MED ORDER — CLONAZEPAM 0.25 MG PO TBDP: 0.2500 mg | ORAL_TABLET | Freq: Two times a day (BID) | ORAL | 0 refills | Status: AC

## 2022-11-12 NOTE — TOC Transition Note (Signed)
Transition of Care [facility]) - CM/SW Discharge Note   Patient Details  Name: Heather [name] MRN: [id number] Date of Birth: 05-30-1975  Transition of Care [facility]) CM/SW Contact:  Harriett Sine, RN Phone Number:[phone]  11/12/2022, 3:04 PM   Clinical Narrative:     Spoke with pt and Shanda Bumps from [facility] about d/c today.  Nurse can call report to [facility] [phone]. Dr. Truitt Merle.     Barriers to Discharge: Continued Medical Work up   Patient Goals and CMS Choice      Discharge Placement                         Discharge Plan and Services Additional resources added to the After Visit Summary for   In-house Referral: NA Discharge Planning Services: CM Consult            DME Arranged: N/A DME Agency: NA       HH Arranged: NA HH Agency: NA        Social Determinants of Health (SDOH) Interventions SDOH Screenings   Food Insecurity: No Food Insecurity (10/31/2022)  Housing: Patient Declined (10/31/2022)  Transportation Needs: Unmet Transportation Needs (10/31/2022)  Utilities: Not At Risk (10/31/2022)  Depression (PHQ2-9): Medium Risk (04/22/2022)  Social Connections: Unknown (09/30/2022)   Received from [facility]  Stress: No Stress Concern Present (10/01/2022)   Received from [facility], [facility]  Tobacco Use: Medium Risk (10/30/2022)     Readmission Risk Interventions    11/01/2022   11:56 AM 10/31/2022    2:42 PM 12/08/2020    1:37 PM  Readmission Risk Prevention Plan  Post Dischage Appt Complete    Medication Screening Complete    Transportation Screening Complete  Complete  PCP or Specialist Appt within 5-7 Days  Complete   Home Care Screening  Complete   Medication Review (RN CM)  Referral to Pharmacy   Medication Review (RN Care Manager)   Complete  PCP or Specialist appointment within 3-5 days of discharge   Complete  HRI or Home Care Consult   Complete  SW Recovery Care/Counseling Consult   Complete   Palliative Care Screening   Not Applicable  Skilled Nursing Facility   Not Applicable

## 2022-11-12 NOTE — Progress Notes (Signed)
Physical Therapy Treatment Patient Details Name: Heather [name] MRN: [id number] DOB: 1975-05-15 Today's Date: 11/12/2022   History of Present Illness Heather [name] is a 47 y.o. female presents with several days of generalized fatigue, weakness, and difficulty walking. PMH: anxiety, arthritis, asthma, back pain, COPD, depression, diabetes, GERD, IBS, morbid obesity, neuromuscular disorder, schizoaffective disorder,    PT Comments  Pt motivated, agreeable to therapy at ambulation to doorway. Pt needing min A to come to sitting EOB, pulling on bedrails and  therapist's hand to upright trunk into sitting. Pt powers to stand with CGA +2 for safety, rocking momentum and wide BOS to power up. Pt able to ambulate 7 ft with RW, slow step to pattern, recliner follow with Gundlach supv+2 for safety. Pt declines ambulation into hallway due to hospital gown and fatigue. Pt able to perform additional STS from recliner and then transfer back to bed. Pt needing mod A to lift BLE back into bed.    If plan is discharge home, recommend the following: A lot of help with bathing/dressing/bathroom;Assistance with cooking/housework;Assist for transportation;Help with stairs or ramp for entrance;A little help with walking and/or transfers   Can travel by private vehicle     No  Equipment Recommendations  None recommended by PT    Recommendations for Other Services       Precautions / Restrictions Precautions Precautions: Fall Restrictions Weight Bearing Restrictions: No     Mobility  Bed Mobility Overal bed mobility: Needs Assistance Bed Mobility: Supine to Sit, Sit to Supine     Supine to sit: HOB elevated, Min assist Sit to supine: Mod assist   General bed mobility comments: pt reaching for bedrails and therapist's hand to pull self up into sitting, min A; pt needing help to lift BLE back into bed despite using momentum to lift them, mod A to lift BLE    Transfers Overall transfer level:  Needs assistance Equipment used: Rolling walker (2 wheels) Transfers: Sit to/from Stand Sit to Stand: +2 safety/equipment, Min guard           General transfer comment: min A+2 for safety for STS from EOB and recliner at bedside, BUE assisting, using rocking momentum with good steadiness    Ambulation/Gait Ambulation/Gait assistance: Supervision, +2 safety/equipment Gait Distance (Feet): 7 Feet Assistive device: Rolling walker (2 wheels) Gait Pattern/deviations: Step-to pattern, Decreased stride length, Wide base of support Gait velocity: decreased     General Gait Details: slow, step to gait pattern with wide BOS, good steadiness without knee buckling or overt LOB, limited by endurance and not wanting to go into hall due to hospital gown, recliner follow with Mongeau supv+2 for safety   Stairs             Wheelchair Mobility     Tilt Bed    Modified Rankin (Stroke Patients Only)       Balance Overall balance assessment: Needs assistance Sitting-balance support: Feet supported Sitting balance-Leahy Scale: Good     Standing balance support: Reliant on assistive device for balance, During functional activity, Bilateral upper extremity supported Standing balance-Leahy Scale: Poor                              Cognition Arousal/Alertness: Awake/alert Behavior During Therapy: WFL for tasks assessed/performed Overall Cognitive Status: Within Functional Limits for tasks assessed  Exercises      General Comments        Pertinent Vitals/Pain Pain Assessment Pain Assessment: No/denies pain    Home Living                          Prior Function            PT Goals (current goals can now be found in the care plan section) Acute Rehab PT Goals Patient Stated Goal: "see my kids" PT Goal Formulation: With patient Time For Goal Achievement: 11/14/22 Potential to Achieve Goals:  Fair Progress towards PT goals: Progressing toward goals    Frequency    Min 1X/week      PT Plan Current plan remains appropriate    Co-evaluation PT/OT/SLP Co-Evaluation/Treatment: Yes Reason for Co-Treatment: To address functional/ADL transfers PT goals addressed during session: Mobility/safety with mobility;Balance OT goals addressed during session: ADL's and self-care;Strengthening/ROM      AM-PAC PT "6 Clicks" Mobility   Outcome Measure  Help needed turning from your back to your side while in a flat bed without using bedrails?: A Lot Help needed moving from lying on your back to sitting on the side of a flat bed without using bedrails?: A Lot Help needed moving to and from a bed to a chair (including a wheelchair)?: A Little Help needed standing up from a chair using your arms (e.g., wheelchair or bedside chair)?: A Little Help needed to walk in hospital room?: A Lot Help needed climbing 3-5 steps with a railing? : Total 6 Click Score: 13    End of Session Equipment Utilized During Treatment: Gait belt (doubled) Activity Tolerance: Patient tolerated treatment well;Patient limited by fatigue Patient left: in bed;with call bell/phone within reach Nurse Communication: Mobility status PT Visit Diagnosis: Other abnormalities of gait and mobility (R26.89);Muscle weakness (generalized) (M62.81) Pain - Right/Left: Right Pain - part of body: Leg     Time: 1610-9604 PT Time Calculation (min) (ACUTE ONLY): 24 min  Charges:    $Therapeutic Activity: 8-22 mins PT General Charges $$ ACUTE PT VISIT: 1 Visit                     Tori  PT, DPT 11/12/22, 5:02 PM

## 2022-11-12 NOTE — Progress Notes (Signed)
Occupational Therapy Treatment Patient Details Name: Heather [name] MRN: [id number] DOB: Aug 23, 1975 Today's Date: 11/12/2022   History of present illness Heather [name] is a 47 y.o. female presents with several days of generalized fatigue, weakness, and difficulty walking. PMH: anxiety, arthritis, asthma, back pain, COPD, depression, diabetes, GERD, IBS, morbid obesity, neuromuscular disorder, schizoaffective disorder,   OT comments  Pt was seen for progression for out of bed activity, functional transfers, and general strengthening, as such is needed to facilitate progressive ADL participation. She was able to perform 2 sit to stand transfers using a RW, one from the EOB then from the bedside chair. She was further able to ambulate to the door in her room using a RW, requiring Waln supervision to occasional light min guard assist. She was assisted back to bed at the end of the session, where she performed simple grooming with set-up assist. Continue OT plan of care. Patient will benefit from continued inpatient follow up therapy, <3 hours/day.    Recommendations for follow up therapy are one component of a multi-disciplinary discharge planning process, led by the attending physician.  Recommendations may be updated based on patient status, additional functional criteria and insurance authorization.    Assistance Recommended at Discharge Frequent or constant Supervision/Assistance  Patient can return home with the following  A lot of help with bathing/dressing/bathroom;Assistance with cooking/housework;A lot of help with walking and/or transfers   Equipment Recommendations  Other (comment) (defer to next level of care)    Recommendations for Other Services      Precautions / Restrictions Precautions Precautions: Fall Restrictions Weight Bearing Restrictions: No       Mobility Bed Mobility Overal bed mobility: Needs Assistance Bed Mobility: Supine to Sit, Sit to Supine      Supine to sit: HOB elevated, Mod assist, Min assist Sit to supine: Mod assist (required assist for BLE)        Transfers Overall transfer level: Needs assistance Equipment used: Rolling walker (2 wheels) Transfers: Sit to/from Stand Sit to Stand: +2 safety/equipment, From elevated surface, Min guard, +2 physical assistance           General transfer comment: Once in standing, the pt was able to ambulate a few feet to the door using a RW with Bernabei supervision. After a seated rest break, she performed another sit to stand from the bedside chair with CGA using a RW         ADL either performed or assessed with clinical judgement   ADL Overall ADL's : Needs assistance/impaired Eating/Feeding: Independent;Sitting Eating/Feeding Details (indicate cue type and reason): based on clinical judgement Grooming: Set up;Bed level Grooming Details (indicate cue type and reason): She performed face washing in the semi-fowler's position in bed. Upper Body Bathing: Minimal assistance   Lower Body Bathing: Maximal assistance Lower Body Bathing Details (indicate cue type and reason): based on clinical judgement Upper Body Dressing : Set up;Sitting Upper Body Dressing Details (indicate cue type and reason): simulated seated EOB Lower Body Dressing: Maximal assistance;Sitting/lateral leans                        Cognition Arousal/Alertness: Awake/alert Behavior During Therapy: WFL for tasks assessed/performed Overall Cognitive Status: Within Functional Limits for tasks assessed           General Comments: Oriented x4, able to follow 1-2 step commands without difficulty.  Pertinent Vitals/ Pain       Pain Assessment Pain Assessment: No/denies pain         Frequency  Min 1X/week        Progress Toward Goals  OT Goals(current goals can now be found in the care plan section)  Progress towards OT goals: Progressing toward goals  Acute Rehab  OT Goals Patient Stated Goal: to be able to walk better OT Goal Formulation: With patient Time For Goal Achievement: 11/15/22 Potential to Achieve Goals: Good  Plan Discharge plan remains appropriate    Co-evaluation    PT/OT/SLP Co-Evaluation/Treatment: Yes Reason for Co-Treatment: To address functional/ADL transfers PT goals addressed during session: Mobility/safety with mobility;Balance OT goals addressed during session: ADL's and self-care;Strengthening/ROM      AM-PAC OT "6 Clicks" Daily Activity     Outcome Measure   Help from another person eating meals?: None Help from another person taking care of personal grooming?: A Little Help from another person toileting, which includes using toliet, bedpan, or urinal?: A Lot Help from another person bathing (including washing, rinsing, drying)?: A Lot Help from another person to put on and taking off regular upper body clothing?: A Little Help from another person to put on and taking off regular lower body clothing?: A Lot 6 Click Score: 16    End of Session Equipment Utilized During Treatment: Gait belt;Rolling walker (2 wheels)  OT Visit Diagnosis: Muscle weakness (generalized) (M62.81);Unsteadiness on feet (R26.81)   Activity Tolerance Patient tolerated treatment well   Patient Left in bed;with call bell/phone within reach   Nurse Communication Mobility status        Time: 4259-5638 OT Time Calculation (min): 24 min  Charges: OT General Charges $OT Visit: 1 Visit OT Treatments $Therapeutic Activity: 8-22 mins     Reuben Likes, OTR/L 11/12/2022, 4:30 PM

## 2022-11-12 NOTE — Progress Notes (Signed)
   PROGRESS NOTE    Heather [name]  NFA:[id number] DOB: 1975/12/07 DOA: 10/30/2022 PCP: Renaye Rakers, MD   Brief Narrative: Heather [name] is a 47 y.o. female with a history of right renal mass status post nephrectomy, depression, morbid obesity, CKD stage IIIb, COPD, schizoaffective disorder.  Patient presented secondary to secondary to increased weakness and difficulty ambulating.  Patient was found to have evidence of pyelonephritis with cultures significant for ESBL E. coli and Providencia stuartii and was treated with meropenem.   Assessment and Plan:  Pyelonephritis Patient with associated dysuria on admission.  Elevated temperature without true fever in addition to leukocytosis also noted on admission.  Urinalysis was suggestive of UTI.  Patient started empirically on meropenem secondary to history of ESBL infection.  Urine culture obtained and was significant for Providencia stuartii in addition to ESBL E. coli.  Patient completed a 5-day course of antibiotics.  CKD stage IIIb Stable.  COPD No evidence of exacerbation at this time. -Continue Dulera, Singulair -Continue albuterol as needed  Depression Schizoaffective disorder -Continue Cymbalta, Abilify   DVT prophylaxis: Lovenox Code Status:   Code Status: Full Code Family Communication: None at bedside Disposition Plan: Discharge to SNF once bed is available.  Medically stable for discharge.   Consultants:  None  Procedures:  None  Antimicrobials: Meropenem   Subjective: Patient with no concerns at this time.  Wanting a regular diet.  Objective: BP 131/69 (BP Location: Right Arm)   Pulse 90   Temp 97.9 F (36.6 C) (Oral)   Resp 15   Ht 5\' 3"  (1.6 m)   Wt (!) 238.2 kg   SpO2 95%   BMI 93.02 kg/m   Examination:  General exam: Appears calm and comfortable  Data Reviewed: I have personally reviewed following labs and imaging studies  CBC Lab Results  Component Value Date   WBC 10.5  11/06/2022   RBC 4.10 11/06/2022   HGB 9.4 (L) 11/06/2022   HCT 34.4 (L) 11/06/2022   MCV 83.9 11/06/2022   MCH 22.9 (L) 11/06/2022   PLT 445 (H) 11/06/2022   MCHC 27.3 (L) 11/06/2022   RDW 19.5 (H) 11/06/2022   LYMPHSABS 2.3 10/31/2022   MONOABS 0.8 10/31/2022   EOSABS 0.6 (H) 10/31/2022   BASOSABS 0.1 10/31/2022     Last metabolic panel Lab Results  Component Value Date   NA 137 11/06/2022   K 4.6 11/06/2022   CL 100 11/06/2022   CO2 28 11/06/2022   BUN 30 (H) 11/06/2022   CREATININE 1.45 (H) 11/06/2022   GLUCOSE 99 11/06/2022   GFRNONAA 46 (L) 11/06/2022   GFRAA >60 11/04/2018   CALCIUM 9.0 11/06/2022   PHOS 4.0 09/03/2020   PROT 7.5 11/06/2022   ALBUMIN 3.0 (L) 11/06/2022   LABGLOB 3.2 06/11/2018   AGRATIO 1.2 06/11/2018   BILITOT 0.4 11/06/2022   ALKPHOS 66 11/06/2022   AST 17 11/06/2022   ALT 16 11/06/2022   ANIONGAP 9 11/06/2022    GFR: Estimated Creatinine Clearance: 99 mL/min (A) (by C-G formula based on SCr of 1.45 mg/dL (H)).  No results found for this or any previous visit (from the past 240 hour(s)).     Radiology Studies: No results found.    LOS: 12 days    Jacquelin Hawking, MD [facility] 11/12/2022, 9:59 AM   If 7PM-7AM, please contact night-coverage [email or web address]

## 2022-11-12 NOTE — Discharge Summary (Signed)
Physician Discharge Summary   Patient: Heather [name] MRN: [id number] DOB: Mar 09, 1976  Admit date:     10/30/2022  Discharge date: 11/12/22  Discharge Physician: Jacquelin Hawking, MD   PCP: Renaye Rakers, MD   Recommendations at discharge:  PCP follow-up Weight loss Mobility  Discharge Diagnoses: Principal Problem:   Pyelonephritis Active Problems:   Essential hypertension   DM type 2 (diabetes mellitus, type 2) (HCC)   OSA (obstructive sleep apnea)   Anemia of chronic disease   CKD (chronic kidney disease), stage III (HCC)   Pyelonephritis, acute   Intertrigo  Resolved Problems:   * No resolved hospital problems. *  Hospital Course: Heather [name] is a 47 y.o. female with a history of right renal mass status post nephrectomy, depression, morbid obesity, CKD stage IIIb, COPD, schizoaffective disorder.  Patient presented secondary to secondary to increased weakness and difficulty ambulating.  Patient was found to have evidence of pyelonephritis with cultures significant for ESBL E. coli and Providencia stuartii and was treated with meropenem.  Assessment and Plan:  Pyelonephritis Patient with associated dysuria on admission.  Elevated temperature without true fever in addition to leukocytosis also noted on admission.  Urinalysis was suggestive of UTI.  Patient started empirically on meropenem secondary to history of ESBL infection.  Urine culture obtained and was significant for Providencia stuartii in addition to ESBL E. coli.  Patient completed a 5-day course of antibiotics.   CKD stage IIIb Stable.   COPD No evidence of exacerbation at this time. Continue outpatient regimen.   Depression Schizoaffective disorder Continue Cymbalta, Abilify, Klonopin   Consultants: None Procedures performed: None  Disposition: Long term care facility Diet recommendation: Regular diet   DISCHARGE MEDICATION: Allergies as of 11/12/2022       Reactions   Bee Venom Shortness Of  Breath, Swelling   Ciprofloxacin Anaphylaxis   Keflex [cephalexin] Anaphylaxis   Thinks she tolerated ceftriaxone.   Penicillins Anaphylaxis   Angioedema. Tolerates ceftriaxone   Vancomycin Other (See Comments)   Pt can tolerate Vancomycin but did cause Vancomycin Infusion Reaction.  Recommend to pre-medicate with Benadryl before doses administered.     Risperidone Other (See Comments)   Imported from [facility] but no reaction. Patient doesn't recall a reaction.   Adhesive [tape] Itching, Rash   Latex Itching, Rash        Medication List     STOP taking these medications    oxybutynin 5 MG tablet Commonly known as: DITROPAN   oxycodone 5 MG capsule Commonly known as: OXY-IR   potassium chloride SA 20 MEQ tablet Commonly known as: KLOR-CON M       TAKE these medications    albuterol 108 (90 Base) MCG/ACT inhaler Commonly known as: VENTOLIN HFA Inhale into the lungs every 6 (six) hours as needed for wheezing or shortness of breath.   ARIPiprazole 5 MG tablet Commonly known as: ABILIFY Take 1 tablet (5 mg total) by mouth daily.   clonazePAM 0.25 MG disintegrating tablet Commonly known as: KLONOPIN Take 1 tablet (0.25 mg total) by mouth 2 (two) times daily.   diclofenac Sodium 1 % Gel Commonly known as: VOLTAREN Apply 4 g topically 3 (three) times daily as needed (knee/back pain).   DULoxetine 30 MG capsule Commonly known as: Cymbalta Take 3 capsules (90 mg total) by mouth daily.   fluticasone 50 MCG/ACT nasal spray Commonly known as: FLONASE Place 2 sprays into both nostrils 2 (two) times daily. What changed:  when to take  this reasons to take this   furosemide 40 MG tablet Commonly known as: LASIX Take 1 tablet (40 mg total) by mouth daily. What changed: when to take this   gabapentin 100 MG capsule Commonly known as: Neurontin Take 1 capsule (100 mg total) by mouth 4 (four) times daily. What changed:  how much to take when to take this    Gerhardt's butt cream Crea Apply 1 Application topically 2 (two) times daily.   Melatonin 10 MG Tabs Take 10 mg by mouth at bedtime.   mometasone-formoterol 100-5 MCG/ACT Aero Commonly known as: DULERA Inhale 2 puffs into the lungs in the morning and at bedtime.   montelukast 10 MG tablet Commonly known as: Singulair Take 1 tablet by mouth at bedtime for asthma.   nystatin powder Commonly known as: MYCOSTATIN/NYSTOP Apply topically 3 (three) times daily. What changed:  how much to take when to take this reasons to take this   rOPINIRole 4 MG 24 hr tablet Commonly known as: REQUIP XL Take 4 mg by mouth at bedtime.   tiZANidine 2 MG tablet Commonly known as: ZANAFLEX Take 1 tablet by mouth 3 times daily as needed for a muscle relaxer.        Follow-up Information     Renaye Rakers, MD. Schedule an appointment as soon as possible for a visit in 1 week(s).   Specialty: Family Medicine Why: For hospital follow-up Contact information: [address] [place] [address] [phone]                Discharge Exam: BP 123/61 (BP Location: Right Arm)   Pulse 85   Temp 98.8 F (37.1 C) (Oral)   Resp 18   Ht 5\' 3"  (1.6 m)   Wt (!) 238.2 kg   SpO2 93%   BMI 93.02 kg/m   General exam: Appears calm and comfortable   Condition at discharge: stable  The results of significant diagnostics from this hospitalization (including imaging, microbiology, ancillary and laboratory) are listed below for reference.   Imaging Studies: DG KNEE 3 VIEW RIGHT  Result Date: 11/01/2022 CLINICAL DATA:  Fall with bruising. EXAM: RIGHT KNEE - 3 VIEW COMPARISON:  None Available. FINDINGS: No acute fracture. No dislocation. Age advanced tricompartmental osteoarthritis with peripheral spurring and joint space narrowing. Fragmented osteophyte arising from the lateral patella. No significant knee joint effusion. IMPRESSION: 1. No acute fracture or dislocation. 2. Age advanced  tricompartmental osteoarthritis. Electronically Signed   By: Narda Rutherford M.D.   On: 11/01/2022 15:44   US RENAL  Result Date: 10/31/2022 CLINICAL DATA:  Nephrolithiasis EXAM: RENAL / URINARY TRACT ULTRASOUND COMPLETE COMPARISON:  CT 02/26/2021 FINDINGS: Right Kidney: Renal measurements: Prior nephrectomy = volume:  mL. Left Kidney: Renal measurements: 11.4 x 6.3 x 6.1 cm = volume: 231 mL. Large stone in the lower pole of the left kidney measuring up to 4 cm in greatest diameter. No hydronephrosis. Bladder: Appears normal for degree of bladder distention. Other: None IMPRESSION: Left lower pole nephrolithiasis. No hydronephrosis. Prior right nephrectomy. Electronically Signed   By: Charlett Nose M.D.   On: 10/31/2022 20:19   DG CHEST PORT 1 VIEW  Result Date: 10/31/2022 CLINICAL DATA:  Dyspnea EXAM: PORTABLE CHEST 1 VIEW COMPARISON:  02/07/2021 FINDINGS: Stable heart size. Pulmonary vascular congestion. No focal airspace consolidation, pleural effusion, or pneumothorax. IMPRESSION: Pulmonary vascular congestion. No focal airspace consolidation. Electronically Signed   By: Duanne Guess D.O.   On: 10/31/2022 11:09  Microbiology: Results for orders placed or performed during the hospital encounter of 10/30/22  Urine Culture     Status: Abnormal   Collection Time: 10/31/22  5:29 AM   Specimen: Urine, Catheterized  Result Value Ref Range Status   Specimen Description   Final    URINE, CATHETERIZED Performed at [facility], [address] [place], [place] [address]    Special Requests   Final    NONE Performed at [facility], [address], [place], [place] [address]    Culture (A)  Final    >=100,000 COLONIES/mL PROVIDENCIA STUARTII 50,000 COLONIES/mL ESCHERICHIA COLI Confirmed Extended Spectrum Beta-Lactamase Producer (ESBL).  In bloodstream infections from ESBL organisms, carbapenems are preferred over piperacillin/tazobactam. They are shown  to have a lower risk of mortality.    Report Status 11/03/2022 FINAL  Final   Organism ID, Bacteria PROVIDENCIA STUARTII (A)  Final   Organism ID, Bacteria ESCHERICHIA COLI (A)  Final      Susceptibility   Escherichia coli - MIC*    AMPICILLIN >=32 RESISTANT Resistant     CEFAZOLIN >=64 RESISTANT Resistant     CEFEPIME 4 INTERMEDIATE Intermediate     CEFTRIAXONE >=64 RESISTANT Resistant     CIPROFLOXACIN <=0.25 SENSITIVE Sensitive     GENTAMICIN >=16 RESISTANT Resistant     IMIPENEM <=0.25 SENSITIVE Sensitive     NITROFURANTOIN <=16 SENSITIVE Sensitive     TRIMETH/SULFA >=320 RESISTANT Resistant     AMPICILLIN/SULBACTAM >=32 RESISTANT Resistant     PIP/TAZO <=4 SENSITIVE Sensitive     * 50,000 COLONIES/mL ESCHERICHIA COLI   Providencia stuartii - MIC*    AMPICILLIN >=32 RESISTANT Resistant     CEFEPIME <=0.12 SENSITIVE Sensitive     CEFTRIAXONE <=0.25 SENSITIVE Sensitive     CIPROFLOXACIN >=4 RESISTANT Resistant     GENTAMICIN RESISTANT Resistant     IMIPENEM 4 SENSITIVE Sensitive     NITROFURANTOIN 128 RESISTANT Resistant     TRIMETH/SULFA >=320 RESISTANT Resistant     AMPICILLIN/SULBACTAM >=32 RESISTANT Resistant     PIP/TAZO <=4 SENSITIVE Sensitive     * >=100,000 COLONIES/mL PROVIDENCIA STUARTII    Labs: CBC: Recent Labs  Lab 11/06/22 0555  WBC 10.5  HGB 9.4*  HCT 34.4*  MCV 83.9  PLT 445*   Basic Metabolic Panel: Recent Labs  Lab 11/06/22 0555  NA 137  K 4.6  CL 100  CO2 28  GLUCOSE 99  BUN 30*  CREATININE 1.45*  CALCIUM 9.0   Liver Function Tests: Recent Labs  Lab 11/06/22 0555  AST 17  ALT 16  ALKPHOS 66  BILITOT 0.4  PROT 7.5  ALBUMIN 3.0*    Discharge time spent: 35 minutes.  Signed: Jacquelin Hawking, MD [facility] 11/12/2022

## 2022-11-12 NOTE — Plan of Care (Signed)
  Problem: Health Behavior/Discharge Planning: Goal: Ability to manage health-related needs will improve 11/12/2022 1955 by Stanton Kidney, RN Outcome: Adequate for Discharge  Problem: Clinical Measurements: Goal: Ability to maintain clinical measurements within normal limits will improve 11/12/2022 1955 by Stanton Kidney, RN Outcome: Adequate for Discharge

## 2022-11-12 NOTE — Progress Notes (Signed)
Report given to nurse at [facility].

## 2024-04-05 ENCOUNTER — Other Ambulatory Visit: Payer: Self-pay | Admitting: Medical Genetics

## 2024-04-21 ENCOUNTER — Other Ambulatory Visit: Payer: Self-pay | Admitting: Medical Genetics

## 2024-04-21 DIAGNOSIS — Z006 Encounter for examination for normal comparison and control in clinical research program: Secondary | ICD-10-CM
